# Patient Record
Sex: Male | Born: 1965 | Race: Black or African American | Hispanic: No | Marital: Single | Smoking: Former smoker
Health system: Southern US, Community
[De-identification: ages and names within clinical notes are randomized; demographics above are authoritative.]

## PROBLEM LIST (undated history)

## (undated) DIAGNOSIS — I639 Cerebral infarction, unspecified: Secondary | ICD-10-CM

## (undated) DIAGNOSIS — G8929 Other chronic pain: Secondary | ICD-10-CM

## (undated) DIAGNOSIS — R443 Hallucinations, unspecified: Secondary | ICD-10-CM

## (undated) DIAGNOSIS — F419 Anxiety disorder, unspecified: Secondary | ICD-10-CM

## (undated) DIAGNOSIS — Z8739 Personal history of other diseases of the musculoskeletal system and connective tissue: Secondary | ICD-10-CM

## (undated) DIAGNOSIS — G4730 Sleep apnea, unspecified: Secondary | ICD-10-CM

## (undated) DIAGNOSIS — R52 Pain, unspecified: Secondary | ICD-10-CM

## (undated) DIAGNOSIS — R079 Chest pain, unspecified: Secondary | ICD-10-CM

## (undated) DIAGNOSIS — M79604 Pain in right leg: Secondary | ICD-10-CM

## (undated) DIAGNOSIS — I10 Essential (primary) hypertension: Secondary | ICD-10-CM

## (undated) DIAGNOSIS — G4700 Insomnia, unspecified: Secondary | ICD-10-CM

## (undated) DIAGNOSIS — I2510 Atherosclerotic heart disease of native coronary artery without angina pectoris: Secondary | ICD-10-CM

## (undated) DIAGNOSIS — IMO0001 Reserved for inherently not codable concepts without codable children: Secondary | ICD-10-CM

## (undated) DIAGNOSIS — R51 Headache: Secondary | ICD-10-CM

## (undated) DIAGNOSIS — N289 Disorder of kidney and ureter, unspecified: Secondary | ICD-10-CM

## (undated) DIAGNOSIS — R569 Unspecified convulsions: Secondary | ICD-10-CM

## (undated) DIAGNOSIS — F209 Schizophrenia, unspecified: Secondary | ICD-10-CM

## (undated) DIAGNOSIS — Z531 Procedure and treatment not carried out because of patient's decision for reasons of belief and group pressure: Secondary | ICD-10-CM

## (undated) DIAGNOSIS — F329 Major depressive disorder, single episode, unspecified: Secondary | ICD-10-CM

## (undated) DIAGNOSIS — F1910 Other psychoactive substance abuse, uncomplicated: Secondary | ICD-10-CM

## (undated) DIAGNOSIS — R0789 Other chest pain: Secondary | ICD-10-CM

## (undated) DIAGNOSIS — M5412 Radiculopathy, cervical region: Secondary | ICD-10-CM

## (undated) DIAGNOSIS — M779 Enthesopathy, unspecified: Secondary | ICD-10-CM

## (undated) DIAGNOSIS — F319 Bipolar disorder, unspecified: Secondary | ICD-10-CM

## (undated) DIAGNOSIS — M542 Cervicalgia: Secondary | ICD-10-CM

## (undated) DIAGNOSIS — F32A Depression, unspecified: Secondary | ICD-10-CM

## (undated) DIAGNOSIS — G629 Polyneuropathy, unspecified: Secondary | ICD-10-CM

## (undated) DIAGNOSIS — I219 Acute myocardial infarction, unspecified: Secondary | ICD-10-CM

## (undated) DIAGNOSIS — M549 Dorsalgia, unspecified: Secondary | ICD-10-CM

## (undated) HISTORY — DX: Anxiety disorder, unspecified: F41.9

## (undated) HISTORY — PX: TONSILLECTOMY: SUR1361

## (undated) HISTORY — DX: Sleep apnea, unspecified: G47.30

## (undated) HISTORY — DX: Enthesopathy, unspecified: M77.9

## (undated) HISTORY — DX: Essential (primary) hypertension: I10

## (undated) HISTORY — PX: MANDIBLE FRACTURE SURGERY: SHX706

## (undated) HISTORY — DX: Unspecified convulsions: R56.9

## (undated) HISTORY — PX: KNEE SURGERY: SHX244

## (undated) HISTORY — DX: Disorder of kidney and ureter, unspecified: N28.9

## (undated) HISTORY — DX: Hallucinations, unspecified: R44.3

## (undated) HISTORY — DX: Depression, unspecified: F32.A

## (undated) HISTORY — DX: Major depressive disorder, single episode, unspecified: F32.9

---

## 2003-02-02 ENCOUNTER — Emergency Department (HOSPITAL_COMMUNITY): Admission: EM | Admit: 2003-02-02 | Discharge: 2003-02-02 | Payer: Self-pay | Admitting: Internal Medicine

## 2004-11-22 ENCOUNTER — Emergency Department (HOSPITAL_COMMUNITY): Admission: EM | Admit: 2004-11-22 | Discharge: 2004-11-22 | Payer: Self-pay | Admitting: Emergency Medicine

## 2006-11-18 ENCOUNTER — Emergency Department (HOSPITAL_COMMUNITY): Admission: EM | Admit: 2006-11-18 | Discharge: 2006-11-18 | Payer: Self-pay | Admitting: Emergency Medicine

## 2007-03-07 ENCOUNTER — Emergency Department (HOSPITAL_COMMUNITY): Admission: EM | Admit: 2007-03-07 | Discharge: 2007-03-07 | Payer: Self-pay | Admitting: Emergency Medicine

## 2007-04-29 ENCOUNTER — Emergency Department (HOSPITAL_COMMUNITY): Admission: EM | Admit: 2007-04-29 | Discharge: 2007-04-29 | Payer: Self-pay | Admitting: Emergency Medicine

## 2008-01-08 ENCOUNTER — Emergency Department (HOSPITAL_COMMUNITY): Admission: EM | Admit: 2008-01-08 | Discharge: 2008-01-08 | Payer: Self-pay | Admitting: Emergency Medicine

## 2008-01-21 ENCOUNTER — Ambulatory Visit (HOSPITAL_COMMUNITY): Admission: RE | Admit: 2008-01-21 | Discharge: 2008-01-21 | Payer: Self-pay | Admitting: Neurology

## 2008-01-28 ENCOUNTER — Emergency Department (HOSPITAL_COMMUNITY): Admission: EM | Admit: 2008-01-28 | Discharge: 2008-01-28 | Payer: Self-pay | Admitting: Emergency Medicine

## 2008-03-02 ENCOUNTER — Emergency Department (HOSPITAL_COMMUNITY): Admission: EM | Admit: 2008-03-02 | Discharge: 2008-03-02 | Payer: Self-pay | Admitting: Emergency Medicine

## 2008-03-04 ENCOUNTER — Emergency Department (HOSPITAL_COMMUNITY): Admission: EM | Admit: 2008-03-04 | Discharge: 2008-03-04 | Payer: Self-pay | Admitting: Emergency Medicine

## 2008-04-01 ENCOUNTER — Ambulatory Visit: Payer: Self-pay | Admitting: Psychiatry

## 2008-04-01 ENCOUNTER — Inpatient Hospital Stay (HOSPITAL_COMMUNITY): Admission: AD | Admit: 2008-04-01 | Discharge: 2008-04-06 | Payer: Self-pay | Admitting: Psychiatry

## 2008-05-23 ENCOUNTER — Emergency Department (HOSPITAL_COMMUNITY): Admission: EM | Admit: 2008-05-23 | Discharge: 2008-05-23 | Payer: Self-pay | Admitting: Emergency Medicine

## 2008-07-21 ENCOUNTER — Emergency Department (HOSPITAL_COMMUNITY): Admission: EM | Admit: 2008-07-21 | Discharge: 2008-07-21 | Payer: Self-pay | Admitting: Emergency Medicine

## 2008-10-02 ENCOUNTER — Emergency Department (HOSPITAL_COMMUNITY): Admission: EM | Admit: 2008-10-02 | Discharge: 2008-10-02 | Payer: Self-pay | Admitting: Emergency Medicine

## 2008-12-08 ENCOUNTER — Emergency Department (HOSPITAL_COMMUNITY): Admission: EM | Admit: 2008-12-08 | Discharge: 2008-12-08 | Payer: Self-pay | Admitting: Emergency Medicine

## 2008-12-11 ENCOUNTER — Emergency Department (HOSPITAL_COMMUNITY): Admission: EM | Admit: 2008-12-11 | Discharge: 2008-12-11 | Payer: Self-pay | Admitting: Emergency Medicine

## 2009-02-13 ENCOUNTER — Ambulatory Visit: Admission: RE | Admit: 2009-02-13 | Discharge: 2009-02-13 | Payer: Self-pay | Admitting: Neurology

## 2009-04-29 ENCOUNTER — Emergency Department (HOSPITAL_COMMUNITY): Admission: EM | Admit: 2009-04-29 | Discharge: 2009-04-29 | Payer: Self-pay | Admitting: Emergency Medicine

## 2009-07-01 ENCOUNTER — Encounter (HOSPITAL_COMMUNITY): Admission: RE | Admit: 2009-07-01 | Discharge: 2009-07-31 | Payer: Self-pay | Admitting: Family Medicine

## 2009-07-31 ENCOUNTER — Other Ambulatory Visit: Payer: Self-pay

## 2009-07-31 ENCOUNTER — Other Ambulatory Visit: Payer: Self-pay | Admitting: Emergency Medicine

## 2009-07-31 ENCOUNTER — Ambulatory Visit: Payer: Self-pay | Admitting: Psychiatry

## 2009-08-01 ENCOUNTER — Inpatient Hospital Stay (HOSPITAL_COMMUNITY): Admission: RE | Admit: 2009-08-01 | Discharge: 2009-08-05 | Payer: Self-pay | Admitting: Psychiatry

## 2010-02-17 ENCOUNTER — Emergency Department (HOSPITAL_COMMUNITY): Admission: EM | Admit: 2010-02-17 | Discharge: 2010-02-17 | Payer: Self-pay | Admitting: Emergency Medicine

## 2010-02-19 ENCOUNTER — Emergency Department (HOSPITAL_COMMUNITY): Admission: EM | Admit: 2010-02-19 | Discharge: 2010-02-19 | Payer: Self-pay | Admitting: Emergency Medicine

## 2010-02-24 ENCOUNTER — Inpatient Hospital Stay (HOSPITAL_COMMUNITY): Admission: EM | Admit: 2010-02-24 | Discharge: 2010-02-26 | Payer: Self-pay | Admitting: Emergency Medicine

## 2010-03-04 ENCOUNTER — Emergency Department (HOSPITAL_COMMUNITY): Admission: EM | Admit: 2010-03-04 | Discharge: 2010-03-04 | Payer: Self-pay | Admitting: Emergency Medicine

## 2010-03-25 ENCOUNTER — Emergency Department (HOSPITAL_COMMUNITY): Admission: EM | Admit: 2010-03-25 | Discharge: 2010-03-25 | Payer: Self-pay | Admitting: Emergency Medicine

## 2010-03-31 ENCOUNTER — Emergency Department (HOSPITAL_COMMUNITY): Admission: EM | Admit: 2010-03-31 | Discharge: 2010-03-31 | Payer: Self-pay | Admitting: Emergency Medicine

## 2010-04-21 ENCOUNTER — Emergency Department (HOSPITAL_COMMUNITY): Admission: EM | Admit: 2010-04-21 | Discharge: 2010-04-21 | Payer: Self-pay | Admitting: Emergency Medicine

## 2010-04-29 ENCOUNTER — Emergency Department (HOSPITAL_COMMUNITY): Admission: EM | Admit: 2010-04-29 | Discharge: 2010-04-29 | Payer: Self-pay | Admitting: Emergency Medicine

## 2010-06-04 ENCOUNTER — Encounter: Admission: RE | Admit: 2010-06-04 | Discharge: 2010-06-04 | Payer: Self-pay | Admitting: Diagnostic Neuroimaging

## 2010-06-06 ENCOUNTER — Other Ambulatory Visit: Payer: Self-pay | Admitting: Emergency Medicine

## 2010-06-06 ENCOUNTER — Other Ambulatory Visit: Payer: Self-pay | Admitting: Diagnostic Radiology

## 2010-06-07 ENCOUNTER — Other Ambulatory Visit: Payer: Self-pay | Admitting: Diagnostic Radiology

## 2010-06-07 ENCOUNTER — Inpatient Hospital Stay (HOSPITAL_COMMUNITY): Admission: AD | Admit: 2010-06-07 | Discharge: 2010-06-11 | Payer: Self-pay | Admitting: Psychiatry

## 2010-06-16 ENCOUNTER — Emergency Department (HOSPITAL_COMMUNITY): Admission: EM | Admit: 2010-06-16 | Discharge: 2010-06-16 | Payer: Self-pay | Admitting: Emergency Medicine

## 2010-08-21 ENCOUNTER — Encounter: Payer: Self-pay | Admitting: Diagnostic Neuroimaging

## 2010-08-30 ENCOUNTER — Emergency Department (HOSPITAL_COMMUNITY)
Admission: EM | Admit: 2010-08-30 | Discharge: 2010-08-30 | Payer: Self-pay | Source: Home / Self Care | Admitting: Emergency Medicine

## 2010-08-30 LAB — CBC
HCT: 39.9 % (ref 39.0–52.0)
Hemoglobin: 14.3 g/dL (ref 13.0–17.0)
MCV: 86.4 fL (ref 78.0–100.0)
RBC: 4.62 MIL/uL (ref 4.22–5.81)
RDW: 13.2 % (ref 11.5–15.5)
WBC: 6 10*3/uL (ref 4.0–10.5)

## 2010-08-30 LAB — BASIC METABOLIC PANEL
BUN: 10 mg/dL (ref 6–23)
CO2: 27 mEq/L (ref 19–32)
Chloride: 99 mEq/L (ref 96–112)
GFR calc non Af Amer: >60 mL/min (ref >60)
Glucose, Bld: 419 mg/dL — ABNORMAL HIGH (ref 70–99)
Potassium: 4.7 mEq/L (ref 3.5–5.1)
Sodium: 136 mEq/L (ref 135–145)

## 2010-08-30 LAB — URINALYSIS, ROUTINE W REFLEX MICROSCOPIC
Hgb urine dipstick: NEGATIVE
Specific Gravity, Urine: 1.01 (ref 1.005–1.030)
Urobilinogen, UA: 0.2 mg/dL (ref 0.0–1.0)
pH: 6 (ref 5.0–8.0)

## 2010-08-30 LAB — DIFFERENTIAL
Basophils Absolute: 0 10*3/uL (ref 0.0–0.1)
Lymphocytes Relative: 48 % — ABNORMAL HIGH (ref 12–46)
Lymphs Abs: 2.9 10*3/uL (ref 0.7–4.0)
Neutro Abs: 2.4 10*3/uL (ref 1.7–7.7)

## 2010-08-30 LAB — GLUCOSE, CAPILLARY: Glucose-Capillary: 393 mg/dL — ABNORMAL HIGH (ref 70–99)

## 2010-08-30 LAB — URINE MICROSCOPIC-ADD ON: Urine-Other: NONE SEEN

## 2010-09-16 ENCOUNTER — Emergency Department (HOSPITAL_COMMUNITY): Payer: Medicaid Other

## 2010-09-16 ENCOUNTER — Emergency Department (HOSPITAL_COMMUNITY)
Admission: EM | Admit: 2010-09-16 | Discharge: 2010-09-16 | Disposition: A | Discharge status: Home / Self Care | Payer: Medicaid Other | Attending: Emergency Medicine | Admitting: Emergency Medicine

## 2010-09-16 DIAGNOSIS — M79609 Pain in unspecified limb: Secondary | ICD-10-CM | POA: Insufficient documentation

## 2010-09-16 DIAGNOSIS — I10 Essential (primary) hypertension: Secondary | ICD-10-CM | POA: Insufficient documentation

## 2010-09-16 DIAGNOSIS — E119 Type 2 diabetes mellitus without complications: Secondary | ICD-10-CM | POA: Insufficient documentation

## 2010-09-16 DIAGNOSIS — Z79899 Other long term (current) drug therapy: Secondary | ICD-10-CM | POA: Insufficient documentation

## 2010-09-16 DIAGNOSIS — Z8639 Personal history of other endocrine, nutritional and metabolic disease: Secondary | ICD-10-CM | POA: Insufficient documentation

## 2010-09-16 DIAGNOSIS — Z862 Personal history of diseases of the blood and blood-forming organs and certain disorders involving the immune mechanism: Secondary | ICD-10-CM | POA: Insufficient documentation

## 2010-09-16 DIAGNOSIS — G40909 Epilepsy, unspecified, not intractable, without status epilepticus: Secondary | ICD-10-CM | POA: Insufficient documentation

## 2010-09-16 DIAGNOSIS — M25579 Pain in unspecified ankle and joints of unspecified foot: Secondary | ICD-10-CM | POA: Insufficient documentation

## 2010-09-19 ENCOUNTER — Emergency Department (HOSPITAL_COMMUNITY)
Admission: EM | Admit: 2010-09-19 | Discharge: 2010-09-19 | Disposition: A | Discharge status: Home / Self Care | Payer: Medicaid Other | Attending: Emergency Medicine | Admitting: Emergency Medicine

## 2010-09-19 DIAGNOSIS — M79609 Pain in unspecified limb: Secondary | ICD-10-CM | POA: Insufficient documentation

## 2010-09-19 DIAGNOSIS — M25579 Pain in unspecified ankle and joints of unspecified foot: Secondary | ICD-10-CM | POA: Insufficient documentation

## 2010-09-19 DIAGNOSIS — I10 Essential (primary) hypertension: Secondary | ICD-10-CM | POA: Insufficient documentation

## 2010-09-19 LAB — D-DIMER, QUANTITATIVE: D-Dimer, Quant: 0.33 ug/mL-FEU (ref 0.00–0.48)

## 2010-10-12 LAB — GLUCOSE, CAPILLARY
Glucose-Capillary: 108 mg/dL — ABNORMAL HIGH (ref 70–99)
Glucose-Capillary: 117 mg/dL — ABNORMAL HIGH (ref 70–99)
Glucose-Capillary: 131 mg/dL — ABNORMAL HIGH (ref 70–99)
Glucose-Capillary: 150 mg/dL — ABNORMAL HIGH (ref 70–99)
Glucose-Capillary: 172 mg/dL — ABNORMAL HIGH (ref 70–99)
Glucose-Capillary: 317 mg/dL — ABNORMAL HIGH (ref 70–99)
Glucose-Capillary: 72 mg/dL (ref 70–99)
Glucose-Capillary: 94 mg/dL (ref 70–99)

## 2010-10-12 LAB — DIFFERENTIAL
Lymphocytes Relative: 49 % — ABNORMAL HIGH (ref 12–46)
Lymphs Abs: 3.1 10*3/uL (ref 0.7–4.0)
Monocytes Relative: 11 % (ref 3–12)
Neutro Abs: 2.4 10*3/uL (ref 1.7–7.7)
Neutrophils Relative %: 38 % — ABNORMAL LOW (ref 43–77)

## 2010-10-12 LAB — CBC
Hemoglobin: 14.2 g/dL (ref 13.0–17.0)
RBC: 4.6 MIL/uL (ref 4.22–5.81)

## 2010-10-12 LAB — URINALYSIS, ROUTINE W REFLEX MICROSCOPIC
Glucose, UA: NEGATIVE mg/dL
Hgb urine dipstick: NEGATIVE
Ketones, ur: NEGATIVE mg/dL
pH: 5.5 (ref 5.0–8.0)

## 2010-10-12 LAB — BASIC METABOLIC PANEL
Calcium: 9.1 mg/dL (ref 8.4–10.5)
GFR calc Af Amer: >60 mL/min (ref >60)
GFR calc non Af Amer: >60 mL/min (ref >60)
Sodium: 141 mEq/L (ref 135–145)

## 2010-10-12 LAB — RAPID URINE DRUG SCREEN, HOSP PERFORMED
Barbiturates: POSITIVE — AB
Cocaine: POSITIVE — AB

## 2010-10-12 LAB — VALPROIC ACID LEVEL
Valproic Acid Lvl: 16 ug/mL — ABNORMAL LOW (ref 50.0–100.0)
Valproic Acid Lvl: 32 ug/mL — ABNORMAL LOW (ref 50.0–100.0)

## 2010-10-12 LAB — ACETAMINOPHEN LEVEL: Acetaminophen (Tylenol), Serum: <10 ug/mL — ABNORMAL LOW (ref 10–30)

## 2010-10-14 LAB — BASIC METABOLIC PANEL
Chloride: 106 mEq/L (ref 96–112)
GFR calc non Af Amer: >60 mL/min (ref >60)
Glucose, Bld: 70 mg/dL (ref 70–99)
Potassium: 4 mEq/L (ref 3.5–5.1)
Sodium: 141 mEq/L (ref 135–145)

## 2010-10-14 LAB — DIFFERENTIAL
Lymphocytes Relative: 43 % (ref 12–46)
Monocytes Absolute: 0.8 10*3/uL (ref 0.1–1.0)
Monocytes Relative: 14 % — ABNORMAL HIGH (ref 3–12)
Neutro Abs: 2.3 10*3/uL (ref 1.7–7.7)

## 2010-10-14 LAB — CBC
HCT: 43.8 % (ref 39.0–52.0)
Hemoglobin: 14.9 g/dL (ref 13.0–17.0)
MCHC: 34 g/dL (ref 30.0–36.0)
MCV: 91.9 fL (ref 78.0–100.0)
WBC: 5.7 10*3/uL (ref 4.0–10.5)

## 2010-10-15 ENCOUNTER — Emergency Department (HOSPITAL_COMMUNITY)
Admission: EM | Admit: 2010-10-15 | Discharge: 2010-10-15 | Disposition: A | Discharge status: Home / Self Care | Payer: Medicaid Other | Attending: Emergency Medicine | Admitting: Emergency Medicine

## 2010-10-15 DIAGNOSIS — M79609 Pain in unspecified limb: Secondary | ICD-10-CM | POA: Insufficient documentation

## 2010-10-15 DIAGNOSIS — G40919 Epilepsy, unspecified, intractable, without status epilepticus: Secondary | ICD-10-CM | POA: Insufficient documentation

## 2010-10-15 DIAGNOSIS — E119 Type 2 diabetes mellitus without complications: Secondary | ICD-10-CM | POA: Insufficient documentation

## 2010-10-15 DIAGNOSIS — M549 Dorsalgia, unspecified: Secondary | ICD-10-CM | POA: Insufficient documentation

## 2010-10-15 DIAGNOSIS — Z79899 Other long term (current) drug therapy: Secondary | ICD-10-CM | POA: Insufficient documentation

## 2010-10-15 DIAGNOSIS — I10 Essential (primary) hypertension: Secondary | ICD-10-CM | POA: Insufficient documentation

## 2010-10-15 LAB — BASIC METABOLIC PANEL
BUN: 10 mg/dL (ref 6–23)
CO2: 33 mEq/L — ABNORMAL HIGH (ref 19–32)
Chloride: 100 mEq/L (ref 96–112)
Chloride: 108 mEq/L (ref 96–112)
GFR calc Af Amer: >60 mL/min (ref >60)
GFR calc non Af Amer: >60 mL/min (ref >60)
Glucose, Bld: 96 mg/dL (ref 70–99)
Potassium: 3.8 mEq/L (ref 3.5–5.1)
Potassium: 3.9 mEq/L (ref 3.5–5.1)
Sodium: 139 mEq/L (ref 135–145)
Sodium: 143 mEq/L (ref 135–145)

## 2010-10-15 LAB — DIFFERENTIAL
Basophils Absolute: 0 10*3/uL (ref 0.0–0.1)
Basophils Relative: 0 % (ref 0–1)
Lymphocytes Relative: 47 % — ABNORMAL HIGH (ref 12–46)
Monocytes Relative: 9 % (ref 3–12)
Neutro Abs: 2.7 10*3/uL (ref 1.7–7.7)

## 2010-10-15 LAB — CBC
HCT: 45 % (ref 39.0–52.0)
Hemoglobin: 15.1 g/dL (ref 13.0–17.0)
MCH: 31 pg (ref 26.0–34.0)
MCV: 92.6 fL (ref 78.0–100.0)
RBC: 4.87 MIL/uL (ref 4.22–5.81)

## 2010-10-15 LAB — GLUCOSE, CAPILLARY: Glucose-Capillary: 201 mg/dL — ABNORMAL HIGH (ref 70–99)

## 2010-10-16 LAB — GLUCOSE, CAPILLARY
Glucose-Capillary: 117 mg/dL — ABNORMAL HIGH (ref 70–99)
Glucose-Capillary: 219 mg/dL — ABNORMAL HIGH (ref 70–99)
Glucose-Capillary: 231 mg/dL — ABNORMAL HIGH (ref 70–99)
Glucose-Capillary: 238 mg/dL — ABNORMAL HIGH (ref 70–99)
Glucose-Capillary: 362 mg/dL — ABNORMAL HIGH (ref 70–99)
Glucose-Capillary: 392 mg/dL — ABNORMAL HIGH (ref 70–99)

## 2010-10-16 LAB — POCT CARDIAC MARKERS
CKMB, poc: 1.6 ng/mL (ref 1.0–8.0)
CKMB, poc: 2.7 ng/mL (ref 1.0–8.0)
Myoglobin, poc: 96.4 ng/mL (ref 12–200)
Troponin i, poc: <0.05 ng/mL (ref 0.00–0.09)

## 2010-10-16 LAB — URINALYSIS, ROUTINE W REFLEX MICROSCOPIC
Glucose, UA: 100 mg/dL — AB
Hgb urine dipstick: NEGATIVE
Ketones, ur: 15 mg/dL — AB
Nitrite: NEGATIVE
Protein, ur: NEGATIVE mg/dL
Urobilinogen, UA: 0.2 mg/dL (ref 0.0–1.0)
pH: 6 (ref 5.0–8.0)

## 2010-10-16 LAB — COMPREHENSIVE METABOLIC PANEL
ALT: 36 U/L (ref 0–53)
AST: 27 U/L (ref 0–37)
Alkaline Phosphatase: 32 U/L — ABNORMAL LOW (ref 39–117)
Alkaline Phosphatase: 55 U/L (ref 39–117)
BUN: 13 mg/dL (ref 6–23)
BUN: 18 mg/dL (ref 6–23)
CO2: 25 mEq/L (ref 19–32)
CO2: 27 mEq/L (ref 19–32)
CO2: 29 mEq/L (ref 19–32)
Calcium: 9.3 mg/dL (ref 8.4–10.5)
Calcium: 9.9 mg/dL (ref 8.4–10.5)
Chloride: 105 mEq/L (ref 96–112)
Creatinine, Ser: 1.3 mg/dL (ref 0.4–1.5)
GFR calc Af Amer: >60 mL/min (ref >60)
GFR calc non Af Amer: 60 mL/min — ABNORMAL LOW (ref >60)
GFR calc non Af Amer: >60 mL/min (ref >60)
Glucose, Bld: 193 mg/dL — ABNORMAL HIGH (ref 70–99)
Glucose, Bld: 213 mg/dL — ABNORMAL HIGH (ref 70–99)
Glucose, Bld: 314 mg/dL — ABNORMAL HIGH (ref 70–99)
Potassium: 3.5 mEq/L (ref 3.5–5.1)
Potassium: 3.6 mEq/L (ref 3.5–5.1)
Sodium: 133 mEq/L — ABNORMAL LOW (ref 135–145)
Total Bilirubin: 0.7 mg/dL (ref 0.3–1.2)
Total Protein: 8.3 g/dL (ref 6.0–8.3)

## 2010-10-16 LAB — LIPID PANEL
HDL: 27 mg/dL — ABNORMAL LOW (ref >39)
Triglycerides: 223 mg/dL — ABNORMAL HIGH (ref <150)

## 2010-10-16 LAB — CBC
HCT: 37.3 % — ABNORMAL LOW (ref 39.0–52.0)
HCT: 43.1 % (ref 39.0–52.0)
Hemoglobin: 13.1 g/dL (ref 13.0–17.0)
Hemoglobin: 14.9 g/dL (ref 13.0–17.0)
MCH: 31.1 pg (ref 26.0–34.0)
MCH: 31.1 pg (ref 26.0–34.0)
MCHC: 34.6 g/dL (ref 30.0–36.0)
MCHC: 35 g/dL (ref 30.0–36.0)
MCV: 89.4 fL (ref 78.0–100.0)
Platelets: 122 10*3/uL — ABNORMAL LOW (ref 150–400)
RBC: 4.18 MIL/uL — ABNORMAL LOW (ref 4.22–5.81)
RDW: 13.1 % (ref 11.5–15.5)
RDW: 13.8 % (ref 11.5–15.5)
WBC: 6.1 10*3/uL (ref 4.0–10.5)

## 2010-10-16 LAB — DIFFERENTIAL
Basophils Absolute: 0 10*3/uL (ref 0.0–0.1)
Basophils Absolute: 0 10*3/uL (ref 0.0–0.1)
Basophils Relative: 0 % (ref 0–1)
Eosinophils Absolute: 0.1 10*3/uL (ref 0.0–0.7)
Eosinophils Absolute: 0.2 10*3/uL (ref 0.0–0.7)
Eosinophils Absolute: 0.2 10*3/uL (ref 0.0–0.7)
Lymphocytes Relative: 50 % — ABNORMAL HIGH (ref 12–46)
Lymphocytes Relative: 51 % — ABNORMAL HIGH (ref 12–46)
Lymphocytes Relative: 53 % — ABNORMAL HIGH (ref 12–46)
Lymphs Abs: 2.9 10*3/uL (ref 0.7–4.0)
Monocytes Absolute: 0.5 10*3/uL (ref 0.1–1.0)
Monocytes Absolute: 0.6 10*3/uL (ref 0.1–1.0)
Neutrophils Relative %: 34 % — ABNORMAL LOW (ref 43–77)
Neutrophils Relative %: 38 % — ABNORMAL LOW (ref 43–77)
Neutrophils Relative %: 38 % — ABNORMAL LOW (ref 43–77)

## 2010-10-16 LAB — RAPID URINE DRUG SCREEN, HOSP PERFORMED
Amphetamines: NOT DETECTED
Barbiturates: NOT DETECTED
Opiates: NOT DETECTED
Tetrahydrocannabinol: NOT DETECTED

## 2010-10-16 LAB — BASIC METABOLIC PANEL
BUN: 20 mg/dL (ref 6–23)
CO2: 29 mEq/L (ref 19–32)
Calcium: 8.8 mg/dL (ref 8.4–10.5)
Creatinine, Ser: 1.44 mg/dL (ref 0.4–1.5)
GFR calc non Af Amer: 53 mL/min — ABNORMAL LOW (ref >60)
Glucose, Bld: 400 mg/dL — ABNORMAL HIGH (ref 70–99)

## 2010-10-16 LAB — HEMOGLOBIN A1C: Hgb A1c MFr Bld: 10.8 % — ABNORMAL HIGH (ref <5.7)

## 2010-10-16 LAB — AMMONIA
Ammonia: 34 umol/L (ref 11–35)
Ammonia: 46 umol/L — ABNORMAL HIGH (ref 11–35)

## 2010-10-16 LAB — TSH: TSH: 0.337 u[IU]/mL — ABNORMAL LOW (ref 0.350–4.500)

## 2010-10-16 LAB — URINE MICROSCOPIC-ADD ON

## 2010-10-16 LAB — CARDIAC PANEL(CRET KIN+CKTOT+MB+TROPI)
CK, MB: 4.4 ng/mL — ABNORMAL HIGH (ref 0.3–4.0)
Relative Index: 1.7 (ref 0.0–2.5)
Troponin I: 0.01 ng/mL (ref 0.00–0.06)
Troponin I: <0.01 ng/mL (ref 0.00–0.06)

## 2010-10-16 LAB — D-DIMER, QUANTITATIVE: D-Dimer, Quant: 0.31 ug/mL-FEU (ref 0.00–0.48)

## 2010-10-16 LAB — VALPROIC ACID LEVEL
Valproic Acid Lvl: 113 ug/mL — ABNORMAL HIGH (ref 50.0–100.0)
Valproic Acid Lvl: 57 ug/mL (ref 50.0–100.0)

## 2010-10-16 LAB — VITAMIN D 1,25 DIHYDROXY
Vitamin D 1, 25 (OH)2 Total: 21 pg/mL (ref 18–72)
Vitamin D3 1, 25 (OH)2: <8 pg/mL

## 2010-10-17 LAB — LIPID PANEL
Cholesterol: 186 mg/dL (ref 0–200)
LDL Cholesterol: 112 mg/dL — ABNORMAL HIGH (ref 0–99)
Total CHOL/HDL Ratio: 3.5 RATIO

## 2010-10-17 LAB — HEPATIC FUNCTION PANEL
ALT: 16 U/L (ref 0–53)
AST: 13 U/L (ref 0–37)
Alkaline Phosphatase: 28 U/L — ABNORMAL LOW (ref 39–117)
Indirect Bilirubin: 0.8 mg/dL (ref 0.3–0.9)
Total Protein: 6.6 g/dL (ref 6.0–8.3)

## 2010-10-17 LAB — TSH: TSH: 0.246 u[IU]/mL — ABNORMAL LOW (ref 0.350–4.500)

## 2010-10-17 LAB — T4, FREE: Free T4: 0.9 ng/dL (ref 0.80–1.80)

## 2010-10-20 ENCOUNTER — Emergency Department (HOSPITAL_COMMUNITY)
Admission: EM | Admit: 2010-10-20 | Discharge: 2010-10-20 | Disposition: A | Discharge status: Home / Self Care | Payer: Medicaid Other | Attending: Emergency Medicine | Admitting: Emergency Medicine

## 2010-10-20 ENCOUNTER — Emergency Department (HOSPITAL_COMMUNITY): Payer: Medicaid Other

## 2010-10-20 DIAGNOSIS — G40909 Epilepsy, unspecified, not intractable, without status epilepticus: Secondary | ICD-10-CM | POA: Insufficient documentation

## 2010-10-20 DIAGNOSIS — M5430 Sciatica, unspecified side: Secondary | ICD-10-CM | POA: Insufficient documentation

## 2010-10-20 DIAGNOSIS — F329 Major depressive disorder, single episode, unspecified: Secondary | ICD-10-CM | POA: Insufficient documentation

## 2010-10-20 DIAGNOSIS — F3289 Other specified depressive episodes: Secondary | ICD-10-CM | POA: Insufficient documentation

## 2010-10-20 DIAGNOSIS — I10 Essential (primary) hypertension: Secondary | ICD-10-CM | POA: Insufficient documentation

## 2010-10-20 DIAGNOSIS — E119 Type 2 diabetes mellitus without complications: Secondary | ICD-10-CM | POA: Insufficient documentation

## 2010-10-20 LAB — CBC
HCT: 42.8 % (ref 39.0–52.0)
Hemoglobin: 15.2 g/dL (ref 13.0–17.0)
MCV: 86.5 fL (ref 78.0–100.0)
RBC: 4.95 MIL/uL (ref 4.22–5.81)
RDW: 12.6 % (ref 11.5–15.5)
WBC: 7 10*3/uL (ref 4.0–10.5)

## 2010-10-20 LAB — DIFFERENTIAL
Basophils Absolute: 0 10*3/uL (ref 0.0–0.1)
Eosinophils Relative: 2 % (ref 0–5)
Lymphocytes Relative: 56 % — ABNORMAL HIGH (ref 12–46)
Lymphs Abs: 3.9 10*3/uL (ref 0.7–4.0)
Neutro Abs: 2.3 10*3/uL (ref 1.7–7.7)
Neutrophils Relative %: 32 % — ABNORMAL LOW (ref 43–77)

## 2010-10-20 LAB — URINALYSIS, ROUTINE W REFLEX MICROSCOPIC
Bilirubin Urine: NEGATIVE
Glucose, UA: >1000 mg/dL — AB
Leukocytes, UA: NEGATIVE
Nitrite: NEGATIVE
Specific Gravity, Urine: 1.02 (ref 1.005–1.030)
pH: 5.5 (ref 5.0–8.0)

## 2010-10-20 LAB — BASIC METABOLIC PANEL
BUN: 12 mg/dL (ref 6–23)
Chloride: 96 mEq/L (ref 96–112)
GFR calc non Af Amer: >60 mL/min (ref >60)
Glucose, Bld: 391 mg/dL — ABNORMAL HIGH (ref 70–99)
Potassium: 3.8 mEq/L (ref 3.5–5.1)
Sodium: 135 mEq/L (ref 135–145)

## 2010-10-20 LAB — URINE MICROSCOPIC-ADD ON

## 2010-10-20 LAB — GLUCOSE, CAPILLARY: Glucose-Capillary: 111 mg/dL — ABNORMAL HIGH (ref 70–99)

## 2010-10-21 ENCOUNTER — Emergency Department (HOSPITAL_COMMUNITY)
Admission: EM | Admit: 2010-10-21 | Discharge: 2010-10-21 | Disposition: A | Discharge status: Home / Self Care | Payer: Medicaid Other | Attending: Emergency Medicine | Admitting: Emergency Medicine

## 2010-10-21 ENCOUNTER — Emergency Department (HOSPITAL_COMMUNITY): Payer: Medicaid Other

## 2010-10-21 DIAGNOSIS — W268XXA Contact with other sharp object(s), not elsewhere classified, initial encounter: Secondary | ICD-10-CM | POA: Insufficient documentation

## 2010-10-21 DIAGNOSIS — E119 Type 2 diabetes mellitus without complications: Secondary | ICD-10-CM | POA: Insufficient documentation

## 2010-10-21 DIAGNOSIS — Y92009 Unspecified place in unspecified non-institutional (private) residence as the place of occurrence of the external cause: Secondary | ICD-10-CM | POA: Insufficient documentation

## 2010-10-21 DIAGNOSIS — I10 Essential (primary) hypertension: Secondary | ICD-10-CM | POA: Insufficient documentation

## 2010-10-21 DIAGNOSIS — S61209A Unspecified open wound of unspecified finger without damage to nail, initial encounter: Secondary | ICD-10-CM | POA: Insufficient documentation

## 2010-10-21 DIAGNOSIS — F341 Dysthymic disorder: Secondary | ICD-10-CM | POA: Insufficient documentation

## 2010-10-21 DIAGNOSIS — Z79899 Other long term (current) drug therapy: Secondary | ICD-10-CM | POA: Insufficient documentation

## 2010-10-25 ENCOUNTER — Inpatient Hospital Stay (HOSPITAL_COMMUNITY)
Admission: EM | Admit: 2010-10-25 | Discharge: 2010-10-26 | DRG: 638 | Disposition: A | Discharge status: Home / Self Care | Payer: Medicaid Other | Attending: Internal Medicine | Admitting: Internal Medicine

## 2010-10-25 DIAGNOSIS — Z91199 Patient's noncompliance with other medical treatment and regimen due to unspecified reason: Secondary | ICD-10-CM

## 2010-10-25 DIAGNOSIS — G4733 Obstructive sleep apnea (adult) (pediatric): Secondary | ICD-10-CM | POA: Diagnosis present

## 2010-10-25 DIAGNOSIS — F17200 Nicotine dependence, unspecified, uncomplicated: Secondary | ICD-10-CM | POA: Diagnosis present

## 2010-10-25 DIAGNOSIS — F319 Bipolar disorder, unspecified: Secondary | ICD-10-CM | POA: Diagnosis present

## 2010-10-25 DIAGNOSIS — E871 Hypo-osmolality and hyponatremia: Secondary | ICD-10-CM | POA: Diagnosis present

## 2010-10-25 DIAGNOSIS — I10 Essential (primary) hypertension: Secondary | ICD-10-CM | POA: Diagnosis present

## 2010-10-25 DIAGNOSIS — G40909 Epilepsy, unspecified, not intractable, without status epilepticus: Secondary | ICD-10-CM | POA: Diagnosis present

## 2010-10-25 DIAGNOSIS — M79609 Pain in unspecified limb: Secondary | ICD-10-CM | POA: Diagnosis present

## 2010-10-25 DIAGNOSIS — Z9119 Patient's noncompliance with other medical treatment and regimen: Secondary | ICD-10-CM

## 2010-10-25 DIAGNOSIS — IMO0001 Reserved for inherently not codable concepts without codable children: Principal | ICD-10-CM | POA: Diagnosis present

## 2010-10-25 DIAGNOSIS — Z794 Long term (current) use of insulin: Secondary | ICD-10-CM

## 2010-10-25 LAB — PROTIME-INR: INR: 0.92 (ref 0.00–1.49)

## 2010-10-25 LAB — URINALYSIS, ROUTINE W REFLEX MICROSCOPIC
Bilirubin Urine: NEGATIVE
Glucose, UA: >1000 mg/dL — AB
Hgb urine dipstick: NEGATIVE
Ketones, ur: NEGATIVE mg/dL
pH: 5 (ref 5.0–8.0)

## 2010-10-25 LAB — CBC
HCT: 42.5 % (ref 39.0–52.0)
Hemoglobin: 14.9 g/dL (ref 13.0–17.0)
RBC: 4.91 MIL/uL (ref 4.22–5.81)
RDW: 12.6 % (ref 11.5–15.5)
WBC: 7.5 10*3/uL (ref 4.0–10.5)

## 2010-10-25 LAB — DIFFERENTIAL
Basophils Absolute: 0 10*3/uL (ref 0.0–0.1)
Eosinophils Relative: 2 % (ref 0–5)
Lymphocytes Relative: 59 % — ABNORMAL HIGH (ref 12–46)
Neutro Abs: 2.1 10*3/uL (ref 1.7–7.7)
Neutrophils Relative %: 29 % — ABNORMAL LOW (ref 43–77)

## 2010-10-25 LAB — BASIC METABOLIC PANEL
BUN: 11 mg/dL (ref 6–23)
BUN: 10 mg/dL (ref 6–23)
Chloride: 92 mEq/L — ABNORMAL LOW (ref 96–112)
Creatinine, Ser: 1.28 mg/dL (ref 0.4–1.5)
GFR calc non Af Amer: >60 mL/min (ref >60)
Glucose, Bld: 682 mg/dL (ref 70–99)
Glucose, Bld: 282 mg/dL — ABNORMAL HIGH (ref 70–99)
Potassium: 4.5 mEq/L (ref 3.5–5.1)
Potassium: 4.1 mEq/L (ref 3.5–5.1)

## 2010-10-25 LAB — VALPROIC ACID LEVEL: Valproic Acid Lvl: 77 ug/mL (ref 50.0–100.0)

## 2010-10-25 LAB — GLUCOSE, CAPILLARY
Glucose-Capillary: 425 mg/dL — ABNORMAL HIGH (ref 70–99)
Glucose-Capillary: 370 mg/dL — ABNORMAL HIGH (ref 70–99)

## 2010-10-25 LAB — APTT: aPTT: 29 seconds (ref 24–37)

## 2010-10-26 LAB — HEPATIC FUNCTION PANEL
Bilirubin, Direct: 0.1 mg/dL (ref 0.0–0.3)
Total Bilirubin: 0.6 mg/dL (ref 0.3–1.2)

## 2010-10-26 LAB — CBC
HCT: 40 % (ref 39.0–52.0)
MCH: 30 pg (ref 26.0–34.0)
MCHC: 34 g/dL (ref 30.0–36.0)
MCV: 88.3 fL (ref 78.0–100.0)
RDW: 12.8 % (ref 11.5–15.5)

## 2010-10-26 LAB — COMPREHENSIVE METABOLIC PANEL
Albumin: 3.4 g/dL — ABNORMAL LOW (ref 3.5–5.2)
BUN: 11 mg/dL (ref 6–23)
CO2: 28 mEq/L (ref 19–32)
Chloride: 103 mEq/L (ref 96–112)
Creatinine, Ser: 1.22 mg/dL (ref 0.4–1.5)
GFR calc non Af Amer: >60 mL/min (ref >60)
Total Bilirubin: 0.4 mg/dL (ref 0.3–1.2)

## 2010-10-26 LAB — DIFFERENTIAL
Eosinophils Relative: 3 % (ref 0–5)
Lymphocytes Relative: 62 % — ABNORMAL HIGH (ref 12–46)
Lymphs Abs: 4 10*3/uL (ref 0.7–4.0)
Monocytes Absolute: 0.6 10*3/uL (ref 0.1–1.0)

## 2010-10-26 LAB — HEMOGLOBIN A1C
Hgb A1c MFr Bld: 10.1 % — ABNORMAL HIGH (ref <5.7)
Mean Plasma Glucose: 243 mg/dL — ABNORMAL HIGH (ref <117)

## 2010-10-26 LAB — T4, FREE: Free T4: 1.12 ng/dL (ref 0.80–1.80)

## 2010-10-26 LAB — TSH: TSH: 0.783 u[IU]/mL (ref 0.350–4.500)

## 2010-10-28 NOTE — Discharge Summary (Signed)
NAME:  [place], [place] NO.:  [id number]  MEDICAL RECORD NO.:  [id number]           PATIENT TYPE:  I  LOCATION:  A303                          FACILITY:  [facility]  PHYSICIAN:  Elliot Cousin, M.D.    DATE OF BIRTH:  07-24-1966  DATE OF ADMISSION:  10/25/2010 DATE OF DISCHARGE:  03/27/2012LH                              DISCHARGE SUMMARY   DISCHARGE DIAGNOSES: 1. Nonketotic hyperosmolar hyperglycemia, secondary to uncontrolled     type 2 diabetes mellitus.  The patient's venous glucose was 682 on     admission and 282 at the time of hospital discharge.  Hemoglobin     A1c result was pending. 2. Hyponatremia secondary to hyperglycemia.  The patient's serum     sodium was 130 on admission and 136 at the time of hospital     discharge. 3. Left leg pain with radiation to the lower back.  No identifiable     cause was elicited during the hospital course.  Of note, during one     of his ED visits, on October 20, 2010, a CT of his abdomen and pelvis     was ordered.  There was a mention of a transitional S1 vertebra,     which pseudoarticulated with the remainder of the sacrum on the     left side.  An outpatient appointment was made for him to follow up     with Dr. [name].  DISCHARGE MEDICATIONS: 1. Lantus SoloSTAR pen.  The dose was increased from 30 units to 40     units subcu each morning. 2. Percocet 10/650 mg 1 tablet every 6 hours as needed for pain and 20     tablets were written for with no refills. 3. Amlodipine 10 mg daily. 4. Atenolol 100 mg daily. 5. Depakote ER 500 mg 1 tablet in the morning and 2 tablets at     bedtime. 6. Doxepin 50 mg at bedtime. 7. Cymbalta 60 mg daily. 8. Vitamin D 50,000 international units 1 capsule weekly. 9. Fish oil 1000 mg b.i.d. 10.Metformin 1000 mg b.i.d. 11.Hydrochlorothiazide 25 mg daily. 12.Keppra 500 mg 1 tablet in the morning and 2 tablets at bedtime. 13.Meclizine 25 mg 1 tablet every 6 hours as needed for  dizziness. 14.Xanax 1 mg four times daily.  DISCHARGE DISPOSITION:  The patient was discharged to home in improved and stable condition.  He was advised to follow up with his primary care physician in 1-2 weeks.  An appointment will be made for him to follow up with Dr. [name] in 2-3 weeks.  CONSULTATIONS:  None.  PROCEDURE PERFORMED:  None.  HISTORY OF PRESENT ILLNESS:  The patient is a 45 year old man with a past medical history significant for type 2 diabetes mellitus, seizure disorder, bipolar disorder, and obstructive sleep apnea.  He presented to the emergency department on October 25, 2010, with a chief complaint of left leg pain that radiated to the back.  When he was initially evaluated, there was no obvious tenderness to palpation of his left leg or left lower back.  His pain had resolved at the time of my initial assessment.  However, his blood glucose was found to be 682.  He was admitted for further evaluation and management.  HOSPITAL COURSE:  The patient was started on IV fluid hydration with normal saline in the emergency department.  He was continued on hydration with normal saline.  The insulin drip Glucommander was initiated in the emergency department.  Prior to him leaving the emergency department, his venous glucose had improved to the 300s. Therefore, the insulin drip was discontinued.  He was started instead on sliding scale NovoLog with the resistant scale and NPH b.i.d.  His serum sodium was noted to be 130.  Following hydration, it improved to 136 prior to hospital discharge.  Hemoglobin A1c, fasting lipid profile, and TSH were all ordered, but the results were pending at the time of hospital discharge.  His valproic acid level was therapeutic at 77.  The patient complained of leg pain during the hospitalization; however, he was able to ambulate without any difficulty.  On my exam, there was only minimal tenderness upon palpation prior to discharge.  He  did request Percocet.  I gave him a prescription for Percocet with only 20 tablets and no refills.  I referred him to Orthopedic surgeon Dr. [name] for an outpatient evaluation.  An appointment with him was pending, but will be arranged prior to the patient's discharge.  Of note, the patient did admit to some noncompliance as sometimes  forgets to give himself Lantus at bedtime.  I therefore instructed him to start taking his Lantus in the morning for better compliance.  Also, the dose was increased to 40 units each morning.  He was admonished to be more compliant with metformin therapy, as he had not taken metformin in several days.  He voiced understanding.  DISCHARGE LABORATORIES:  Magnesium 1.8, PTT 29, PT 12.6, phosphorus 4.2. Valproic acid level 77.  Sodium 136, potassium 4.1, chloride 100, CO2 of 29, glucose 282, BUN 10, creatinine 1.04, calcium 8.9, total bilirubin 0.4, alkaline phosphatase 49, SGOT 13, SGPT 19, total protein 5.8, albumin 3.4, calcium 8.6.  WBC 6.4, hemoglobin 13.6, platelet count 197.     Elliot Cousin, M.D.     DF/MEDQ  D:  10/26/2010  T:  10/26/2010  Job:  [id number]  cc:   Dr. Duard Brady, M.D. Fax: [phone]  Electronically Signed by Elliot Cousin M.D. on 10/28/2010 10:29:52 PM

## 2010-10-28 NOTE — H&P (Signed)
NAME:  [place], [place] NO.:  [id number]  MEDICAL RECORD NO.:  [id number]           PATIENT TYPE:  I  LOCATION:  A303                          FACILITY:  [facility]  PHYSICIAN:  Elliot Cousin, M.D.    DATE OF BIRTH:  09/30/1965  DATE OF ADMISSION:  10/25/2010 DATE OF DISCHARGE:  LH                             HISTORY & PHYSICAL   PRIMARY CARE PHYSICIAN:  [name] in [place].  CHIEF COMPLAINT:  Left leg pain and elevated blood sugars.  HISTORY OF PRESENT ILLNESS:  The patient is a 45 year old man with a past medical history significant for type 2 diabetes mellitus, seizure disorder, bipolar disorder, and obstructive sleep apnea.  He presents to the emergency department today with a chief complaint of left leg pain and elevated blood sugars at home.  He states that he has had leg pain for the past 3-4 days.  It starts in his foot and it radiates to his knee and then to his hip.  It then radiates to his left lower back.  He describes the pain as an aching type pain.  At its worst, it is a 10/10 in intensity.  He took Tylenol for the pain, but it did not relieve it. He did have some swelling in his knee, but the swelling has now resolved.  He denies swelling in his foot or swelling in his hip.  The pain intensifies when he tries to walk.  He was told that he may have gout.  He had taken high-dose ibuprofen in the past for low back pain, but none recently for his leg pain.  He denies any recent trauma to his left leg.  His blood sugars have been running in the 300s.  He says it is because he has not been eating right.  He has missed several doses of insulin.  He is on a diabetes pill, but he cannot recall the name of it.  He says that it is at the pharmacy and he has not picked it up yet.  He has had increased thirst and urination over the past 2-3 days.  He has had a dry mouth.  He has had loose stools numbering 4 or 5 yesterday.  He has had chills.  He denies  headache, nausea, vomiting, abdominal pain, chest pain, shortness of breath, pain with urination, bright red blood per rectum and black tarry stools.  In the emergency department, the patient is noted to be afebrile and hemodynamically stable.  His sodium is low at 130.  His glucose is elevated at 682.  His BUN is within normal limits at 11.  His serum potassium is within normal limits at 4.5.  His urinalysis has greater than 1000 glucose, otherwise negative.  A CBC was not ordered by the ED physician.  The patient is being admitted for further evaluation and management.  PAST MEDICAL HISTORY: 1. Type 2 diabetes mellitus, diagnosed in 2011. 2. Seizure disorder, secondary to a head injury when he was 45 years     of age. 3. Depressive bipolar disorder with a history of suicidal ideations in     the  past, necessitating Behavioral Health admissions with the     latest being in November 2011. 4. Obstructive sleep apnea. 5. Status post corrective jaw surgery for obstructive sleep apnea.     The patient does use CPAP. 6. Hypertension. 7. History of cocaine and alcohol abuse.   8. Laceration of his right ring finger on October 21, 2010, status post     stitches by the ED physician. 9. Status post tonsillectomy in the past. 10.Suspect noncompliance.  MEDICATIONS:  The patient cannot tell me all of the doses and/or names of his medications. 1. He takes insulin 30 units at bedtime, but he does not know the type     of insulin. 2. Diabetes pill once daily. 3. Atenolol once daily. 4. Hydrochlorothiazide once daily. 5. Amlodipine once daily. 6. Xanax 1 mg 4 times daily. 7. Cymbalta once daily. 8. Depakote 500 mg 1 tablet in the morning and 2 tablets at bedtime. 9. Keppra 500 mg 1 tablet in the morning and 2 tablets at bedtime. 10.Ibuprofen 800 mg as needed. 11.Over-the-counter Tylenol as needed.  ALLERGIES:  The patient has allergies to ASPIRIN, which causes a rash and hives.  SOCIAL  HISTORY:  The patient is single.  [place]  He has no children.  Up until recently, he smoked 2 packs of cigarettes per day.  He is now down to a third to half a pack of cigarettes per day.  He was a heavy alcohol drinker, but none in the past couple of years.  He admits to cocaine abuse in the past, but none since last year.  He denies any other illicit drugs.  Of note, the patient's urine drug screen in November 2011 was positive for cocaine.  FAMILY HISTORY:  His mother is in her 50s.  He is unaware of any significant past medical history.  His father is in his 51s.  He had a heart attack when he was in his 80s.  REVIEW OF SYSTEMS:  As above in the history present illness.  Otherwise review of systems is negative.  PHYSICAL EXAMINATION:  VITAL SIGNS:  Temperature 97.4, blood pressure 126/67, pulse 85, respiratory rate 18, oxygen saturation 96% on room air. GENERAL:  The patient is a 45 year old [place] man who is currently standing up, talking on the phone as I enter his room.  He is in no acute distress. HEENT:  Head is normocephalic, nontraumatic.  Pupils equal, round, and reactive to light.  Extraocular muscles are intact.  Conjunctivae are clear.  Sclerae are white.  Tympanic membranes on the left is clear. Tympanic membrane on the right is mildly erythematous, but no drainage or bulging.  Nasal mucosa is dry.  No sinus tenderness.  Oropharynx reveals multiple missing teeth.  Mucous membranes are dry.  No posterior exudates or erythema. NECK:  Supple.  No adenopathy, no thyromegaly, no bruit, no JVD. LUNGS:  Clear to auscultation bilaterally with decreased breath sounds in the bases. HEART:  S1 and S2 with no murmurs, rubs or gallops. ABDOMEN:  Mildly obese positive bowel sounds, soft, nontender, nondistended.  No hepatosplenomegaly.  No masses palpated. GU and RECTAL:  Deferred. EXTREMITIES:  Pedal pulses are palpable bilaterally.  No  pretibial edema and no pedal edema.  No acute hot red joints.  No tenderness over the hip joint, proximal right leg muscles or distal right leg muscles. Range of motion with hip and knee extension, flexion, internal, and external rotation elicit no pain or discomfort. NEUROLOGIC:  The patient is alert and oriented x3.  Cranial nerves II through XII are intact.  His gait is within normal limits. PSYCHOLOGIC:  The patient is alert.  He is cooperative.  His speech is not pressured.  LABORATORY DATA:  Admission laboratories; sodium 138, potassium 4.5, chloride 92, CO2 of 27, glucose 682, BUN 11, creatinine 1.28, calcium 9.4.  Urinalysis essentially negative with exception of greater than 1000 glucose.  ASSESSMENT: 1. Nonketotic hyperosmolar hyperglycemia secondary to uncontrolled     type 2 diabetes mellitus.  The patient's venous glucose is 682.     His bicarbonate is within normal limits at 27.  His anion gap is     only 11.  The patient's uncontrolled diabetes mellitus is secondary     to noncompliance and dietary indiscretion. 2. Hyponatremia.  The patient's serum sodium is 130, likely secondary     to hyperglycemia. 3. Left leg pain.  The patient reports left leg pain, but the exam is     actually within normal limits.  I did not detect any abnormalities.     He appears to be ambulating in the room without difficulty. 4. Seizure disorder.  The patient is treated with Keppra and Depakote. 5. Bipolar disorder.  The patient is treated apparently with Cymbalta,     Depakote, and Xanax. 6. Hypertension.  The patient's blood pressure is probably on the     lower end of normal given that he is treated with 3     antihypertensive medications. 7. Obstructive sleep apnea.  He is treated with CPAP nightly. 8. Status post suturing of the right ring finger laceration on October 21, 2010.  His stitches will need to be removed in a few     days. 9. Tobacco abuse.  The patient was advised to  stop completely.  PLAN: 1. The insulin drip Glucommander was started.  His blood glucose is     now in the upper 300s.  We will continue the Glucommander until he     is transferred to the floor. 2. We will start sliding scale NovoLog with resistant scale before     each meal and at bedtime and b.i.d. dosing of NPH when the     Glucommander is discontinued. 3. We will continue IV fluid hydration. 4. We will consult the registered dietitian for review of diabetes     management. 5. We will ask the respiratory therapist to administer CPAP at     bedtime. 6. For further evaluation, we will order a valproic acid level,     hemoglobin A1c, thyroid function panel, fasting lipid panel, and a     followup BMET tonight. 7. Tobacco cessation counseling. 8. We will also order a CBC, which was not ordered in the emergency     department.     Elliot Cousin, M.D.     DF/MEDQ  D:  10/25/2010  T:  10/25/2010  Job:  [id number]  cc:   [name] [facility]  Electronically Signed by Elliot Cousin M.D. on 10/28/2010 10:23:26 PM

## 2010-11-01 LAB — BASIC METABOLIC PANEL
CO2: 33 mEq/L — ABNORMAL HIGH (ref 19–32)
Calcium: 9.5 mg/dL (ref 8.4–10.5)
Chloride: 102 mEq/L (ref 96–112)
GFR calc Af Amer: >60 mL/min (ref >60)
Sodium: 141 mEq/L (ref 135–145)

## 2010-11-01 LAB — CBC
Hemoglobin: 16 g/dL (ref 13.0–17.0)
MCHC: 34.1 g/dL (ref 30.0–36.0)
RBC: 5.09 MIL/uL (ref 4.22–5.81)
WBC: 6.8 10*3/uL (ref 4.0–10.5)

## 2010-11-01 LAB — RAPID URINE DRUG SCREEN, HOSP PERFORMED
Barbiturates: NOT DETECTED
Opiates: NOT DETECTED
Tetrahydrocannabinol: POSITIVE — AB

## 2010-11-01 LAB — DIFFERENTIAL
Basophils Relative: 0 % (ref 0–1)
Lymphs Abs: 3.2 10*3/uL (ref 0.7–4.0)
Monocytes Absolute: 0.7 10*3/uL (ref 0.1–1.0)
Monocytes Relative: 10 % (ref 3–12)
Neutro Abs: 2.8 10*3/uL (ref 1.7–7.7)

## 2010-11-01 LAB — ETHANOL: Alcohol, Ethyl (B): <5 mg/dL (ref 0–10)

## 2010-11-11 LAB — STREP A DNA PROBE: Group A Strep Probe: NEGATIVE

## 2010-11-22 ENCOUNTER — Emergency Department (HOSPITAL_COMMUNITY): Payer: Medicaid Other

## 2010-11-22 ENCOUNTER — Emergency Department (HOSPITAL_COMMUNITY)
Admission: EM | Admit: 2010-11-22 | Discharge: 2010-11-22 | Disposition: A | Discharge status: Home / Self Care | Payer: Medicaid Other | Attending: Emergency Medicine | Admitting: Emergency Medicine

## 2010-11-22 DIAGNOSIS — F411 Generalized anxiety disorder: Secondary | ICD-10-CM | POA: Insufficient documentation

## 2010-11-22 DIAGNOSIS — R42 Dizziness and giddiness: Secondary | ICD-10-CM | POA: Insufficient documentation

## 2010-11-22 DIAGNOSIS — G4730 Sleep apnea, unspecified: Secondary | ICD-10-CM | POA: Insufficient documentation

## 2010-11-22 DIAGNOSIS — E119 Type 2 diabetes mellitus without complications: Secondary | ICD-10-CM | POA: Insufficient documentation

## 2010-11-22 DIAGNOSIS — G43909 Migraine, unspecified, not intractable, without status migrainosus: Secondary | ICD-10-CM | POA: Insufficient documentation

## 2010-11-22 DIAGNOSIS — G40802 Other epilepsy, not intractable, without status epilepticus: Secondary | ICD-10-CM | POA: Insufficient documentation

## 2010-11-22 DIAGNOSIS — F17200 Nicotine dependence, unspecified, uncomplicated: Secondary | ICD-10-CM | POA: Insufficient documentation

## 2010-11-22 DIAGNOSIS — M109 Gout, unspecified: Secondary | ICD-10-CM | POA: Insufficient documentation

## 2010-11-22 DIAGNOSIS — Z79899 Other long term (current) drug therapy: Secondary | ICD-10-CM | POA: Insufficient documentation

## 2010-11-22 DIAGNOSIS — M129 Arthropathy, unspecified: Secondary | ICD-10-CM | POA: Insufficient documentation

## 2010-11-22 LAB — DIFFERENTIAL
Basophils Absolute: 0 10*3/uL (ref 0.0–0.1)
Basophils Relative: 0 % (ref 0–1)
Eosinophils Absolute: 0.2 10*3/uL (ref 0.0–0.7)
Eosinophils Relative: 2 % (ref 0–5)
Monocytes Absolute: 0.5 10*3/uL (ref 0.1–1.0)

## 2010-11-22 LAB — BASIC METABOLIC PANEL
Calcium: 9.2 mg/dL (ref 8.4–10.5)
GFR calc non Af Amer: >60 mL/min (ref >60)
Glucose, Bld: 248 mg/dL — ABNORMAL HIGH (ref 70–99)
Potassium: 3.7 mEq/L (ref 3.5–5.1)
Sodium: 136 mEq/L (ref 135–145)

## 2010-11-22 LAB — CBC
MCHC: 34.7 g/dL (ref 30.0–36.0)
Platelets: 163 10*3/uL (ref 150–400)
RDW: 12.7 % (ref 11.5–15.5)
WBC: 8.1 10*3/uL (ref 4.0–10.5)

## 2010-11-22 LAB — GLUCOSE, CAPILLARY: Glucose-Capillary: 279 mg/dL — ABNORMAL HIGH (ref 70–99)

## 2010-12-06 ENCOUNTER — Emergency Department (HOSPITAL_COMMUNITY)
Admission: EM | Admit: 2010-12-06 | Discharge: 2010-12-06 | Disposition: A | Discharge status: Home / Self Care | Payer: Medicaid Other | Attending: Emergency Medicine | Admitting: Emergency Medicine

## 2010-12-06 ENCOUNTER — Emergency Department (HOSPITAL_COMMUNITY): Payer: Medicaid Other

## 2010-12-06 DIAGNOSIS — Y92009 Unspecified place in unspecified non-institutional (private) residence as the place of occurrence of the external cause: Secondary | ICD-10-CM | POA: Insufficient documentation

## 2010-12-06 DIAGNOSIS — X500XXA Overexertion from strenuous movement or load, initial encounter: Secondary | ICD-10-CM | POA: Insufficient documentation

## 2010-12-06 DIAGNOSIS — M25569 Pain in unspecified knee: Secondary | ICD-10-CM | POA: Insufficient documentation

## 2010-12-06 DIAGNOSIS — S8990XA Unspecified injury of unspecified lower leg, initial encounter: Secondary | ICD-10-CM | POA: Insufficient documentation

## 2010-12-10 ENCOUNTER — Emergency Department (HOSPITAL_COMMUNITY)
Admission: EM | Admit: 2010-12-10 | Discharge: 2010-12-10 | Disposition: A | Discharge status: Home / Self Care | Payer: Medicaid Other | Attending: Emergency Medicine | Admitting: Emergency Medicine

## 2010-12-10 DIAGNOSIS — I10 Essential (primary) hypertension: Secondary | ICD-10-CM | POA: Insufficient documentation

## 2010-12-10 DIAGNOSIS — G40909 Epilepsy, unspecified, not intractable, without status epilepticus: Secondary | ICD-10-CM | POA: Insufficient documentation

## 2010-12-10 DIAGNOSIS — M25569 Pain in unspecified knee: Secondary | ICD-10-CM | POA: Insufficient documentation

## 2010-12-10 DIAGNOSIS — F411 Generalized anxiety disorder: Secondary | ICD-10-CM | POA: Insufficient documentation

## 2010-12-10 DIAGNOSIS — E119 Type 2 diabetes mellitus without complications: Secondary | ICD-10-CM | POA: Insufficient documentation

## 2010-12-14 NOTE — Procedures (Signed)
NAME:  [place], [place] NO.:  [id number]   MEDICAL RECORD NO.:  [id number]          PATIENT TYPE:  OUT   LOCATION:  SLEE                          FACILITY:  [facility]   PHYSICIAN:  Kofi A. Gerilyn Pilgrim, M.D. DATE OF BIRTH:  1965/10/08   DATE OF PROCEDURE:  02/13/2009  DATE OF DISCHARGE:  02/13/2009                             SLEEP DISORDER REPORT   INDICATIONS:  A 45 year old man who presents with hypersomnia, snoring,  and has been evaluated for obstructive sleep apnea syndrome.   MEDICATIONS:  Zanaflex, atenolol, Depakote, Motrin, Zyrtec,  hydrochlorothiazide, Norvasc, Zoloft, Prozac.   Epworth sleepiness scale of 17.  BMI 32.   ARCHITECTURAL SUMMARY:  The total recording time was 668.5 minutes.  The  sleep efficiency is 92.3%.  Sleep latency 5 minutes.  REM latency 217.5  minutes.  Stage N1 16.3%, N2 56.5%, N3 21%, and REM sleep 6.2%.   RESPIRATORY SUMMARY:  The baseline oxygen saturation is 98% and lowest  saturation 86%.  AHI is 9.   LIMB MOVEMENT SUMMARY:  PLM index 0.   ELECTROCARDIOGRAM SUMMARY:  Average heart rate is 58 with no significant  dysrhythmias observed.   IMPRESSION:  Mild obstructive sleep apnea syndrome.      Kofi A. Gerilyn Pilgrim, M.D.  Electronically Signed     KAD/MEDQ  D:  02/16/2009  T:  02/17/2009  Job:  [id number]

## 2010-12-14 NOTE — H&P (Signed)
NAME:  [place], [place] NO.:  [id number]   MEDICAL RECORD NO.:  [id number]          PATIENT TYPE:  IPS   LOCATION:  0405                          FACILITY:  [facility]   PHYSICIAN:  Anselm Jungling, MD  DATE OF BIRTH:  [birth year]   DATE OF ADMISSION:  04/01/2008  DATE OF DISCHARGE:                       PSYCHIATRIC ADMISSION ASSESSMENT   DATE/TIME OF ASSESSMENT:  April 02, 2008, at 11:15 a.m.   IDENTIFYING INFORMATION:  A 45 year old single African American male.  This is an involuntary admission.   HISTORY OF PRESENT ILLNESS:  First [facility] admission for this 45 year old,  who reports suicidal thoughts for the past 3 days after the girlfriend  broke up with him over the weekend.  Her family had spoken with her that  they disapproved of their relationship.  She told him that it was a  matter of choosing between him or her family and she chose her family.  He is quite upset about the situation.  Says that he really loved her,  did not want to end the relationship.  She told him she wants to just be  friends, and he feels he cannot cope with this.  He is thinking about  possibly stabbing himself and reports a history of prior suicide  attempts in the past.  Denies homicidal thoughts.  Says he has been  using cocaine for approximately 2 weeks due to stress over this  relationship.  Previously abstinent for 2 months and is vague about his  pattern prior to then.   PAST PSYCHIATRIC HISTORY:  First [facility] admission.  He endorses a history  of cocaine abuse in the past. No current outpatient care.  No current  treatment for depression.   SOCIAL HISTORY:  Single African American male, not currently working.  Living with his grandmother and has lived there since he was 68 years  old.  Says that he cannot work due to his history of epilepsy and  migraine headaches.  Previously was doing some side work for extra cash,  currently has no income.  He has a basic education, completed  the  twelfth grade.  He denies any legal problems.  He denies a family  history of mental illness or substance abuse.   MEDICAL HISTORY:  Primary care practitioner is Dr. Darleen Crocker A. [place], his  neurologist.  Last seen there March 17, 2008, and Dr. Ronal Fear most  recent note is in the record.  He follows the patient for hypertension,  epilepsy, headache NOS and monitoring of dangerous drug, also sleep  apnea and uses a CPAP machine.  Last seizure he reports was 2 days ago.   CURRENT MEDICATIONS:  1. Atenolol 100 mg daily.  2. Hydrochlorothiazide 25 mg daily.  3. Norvasc 10 mg daily.  4. Depakote 500 mg 2 tablets q.a.m., 3 tablets q.h.s.  5. Keppra XR 500 mg 3 tablets daily.  6. Nortriptyline 500 mg 1 q.p.m.  7. Zyrtec 10 mg daily.  8. Xanax 1 mg q.h.s.  9. Nasonex 50 mcg dose, 2 sprays in each nostril daily.  10.Soma 350 mg 3 times a day as needed.  11.Relafen 750  mg b.i.d.  12.Robaxin 750 mg t.i.d.  13.Vicodin 10 mg q.12 h p.r.n. as needed for a severe headache.   DRUG ALLERGIES:  ASPIRIN, WHICH CAUSES A RASH.   Medications were validated with Dr. Darleen Crocker A. [place]'s office, who has  faxed his last note and medication list.   PHYSICAL EXAMINATION:  Physical examination was done in the emergency  room and is noted in the record.  A healthy-appearing African American  male in no distress.   Diagnostic studies were remarkable for a valproate level of 69, alcohol  level less than 5.  CBC is normal.  Chemistry within normal limits.  Urine drug screen positive for marijuana, benzodiazepines, and cocaine.   MENTAL STATUS EXAM:  Fully alert male.  Cooperative.  Blunted affect.  Fully coherent.  Does not appear to be internally distracted.  Oriented  x4.  He is polite, open.  Quite depressed, somewhat grim affect.  No  signs and symptoms of psychosis or thought disorder.  Asking for help.  Feeling depressed.  Gives an okay to contact his sister.   INITIAL IMPRESSION:  AXIS I:   Major depressive disorder, recurrent.  Polysubstance abuse.  AXIS II:  No diagnosis.  AXIS III:  Epilepsy.  Migraine headache.  Hypertension.  AXIS IV:  Severe issues with relationship breakup.  AXIS V:  Current 46, past year not known.   The plan is to voluntarily admit him to our dual diagnosis program.  At  this point, we are going to discontinue his Xanax, but we will continue  his other medications.  We have placed him on a Librium protocol.  We  will continue all of his other medications for seizure management.  He  has agreed to consider a trial of Prozac, and we will get him started on  that,  and we will evaluate his support system.      Margaret A. Lorin Picket, N.P.      Anselm Jungling, MD  Electronically Signed    MAS/MEDQ  D:  04/02/2008  T:  04/02/2008  Job:  [id number]
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
## 2010-12-28 ENCOUNTER — Encounter: Payer: Self-pay | Admitting: Orthopedic Surgery

## 2010-12-28 ENCOUNTER — Ambulatory Visit (INDEPENDENT_AMBULATORY_CARE_PROVIDER_SITE_OTHER): Payer: Medicaid Other | Admitting: Orthopedic Surgery

## 2010-12-28 VITALS — HR 76 | Resp 16 | Ht 68.0 in | Wt 214.0 lb

## 2010-12-28 DIAGNOSIS — M25569 Pain in unspecified knee: Secondary | ICD-10-CM

## 2010-12-28 DIAGNOSIS — M23329 Other meniscus derangements, posterior horn of medial meniscus, unspecified knee: Secondary | ICD-10-CM

## 2010-12-28 DIAGNOSIS — G8929 Other chronic pain: Secondary | ICD-10-CM | POA: Insufficient documentation

## 2010-12-28 NOTE — Patient Instructions (Signed)
Follow-up after MRI

## 2010-12-28 NOTE — Progress Notes (Signed)
LEFT knee pain.  35 her old male history of seizure disorder anxiety depression diabetes hypertension and sleep apnea status post jaw procedure for sleep apnea presents with a 4 month history of medial knee pain after twisting injury when he fell off of a deck/porch while he was having a seizure.  Now presents with severe throbbing constant medial knee pain associated with locking unrelieved by Vicodin 5 mg, relieved by Percocet 7.5 mg.  Other associated signs or swelling and cold intolerance.  Review of systems fatigue and blurred vision hearing chest pain snoring tightness of chest constipation diarrhea flank pain joint pain joint swelling stiffness muscle pain, itching, dizziness tremor seizure, nervousness anxiety depression hallucinations, easy bruising.  He can cold intolerance especially cold intolerance LEFT knee, seasonal ALLERGIES.  Height 5 foot 8 inches, weight 214 pounds her vitals recorded above  GENERAL: normal development   CDV: pulses are normal   Skin: normal  Lymph: deferred  Psychiatric: awake, alert and oriented  Neuro: normal sensation  MSK   The ambulation x-rays seems normal  Chest tenderness over the medial joint line of the LEFT knee, no joint effusion.  Painful range of motion of the knee.  He is stable.  Strength is normal skin is intact.  The medial meniscal tests for tear including McMurray sign positive.  The RIGHT knee has full range of motion no swelling no tenderness, knee is stable.  Strength is normal skin contact  Bilateral normal pulse and temperature and negative lymph nodes bilaterally  Normal sensation bilaterally  Deep tendon reflexes bilaterally Normal  Coordination balance normal  X-ray reports and films indicate no acute disease and no arthritis  Impression torn medial meniscus  Recommend MRI  Recommend stopping Percocet 7.5, this will lead to chronic pain management problems.   X-rays were done in the hospital in May, no  acute fracture, or dislocation seen.

## 2011-01-04 ENCOUNTER — Emergency Department (HOSPITAL_COMMUNITY)
Admission: EM | Admit: 2011-01-04 | Discharge: 2011-01-04 | Disposition: A | Discharge status: Home / Self Care | Payer: Medicaid Other | Attending: Emergency Medicine | Admitting: Emergency Medicine

## 2011-01-04 DIAGNOSIS — M25569 Pain in unspecified knee: Secondary | ICD-10-CM | POA: Insufficient documentation

## 2011-01-04 DIAGNOSIS — F17200 Nicotine dependence, unspecified, uncomplicated: Secondary | ICD-10-CM | POA: Insufficient documentation

## 2011-01-04 LAB — GLUCOSE, CAPILLARY: Glucose-Capillary: 185 mg/dL — ABNORMAL HIGH (ref 70–99)

## 2011-01-05 ENCOUNTER — Ambulatory Visit (HOSPITAL_COMMUNITY)
Admission: RE | Admit: 2011-01-05 | Discharge: 2011-01-05 | Disposition: A | Discharge status: Home / Self Care | Payer: Medicaid Other | Source: Ambulatory Visit | Attending: Orthopedic Surgery | Admitting: Orthopedic Surgery

## 2011-01-05 DIAGNOSIS — M25569 Pain in unspecified knee: Secondary | ICD-10-CM

## 2011-01-05 DIAGNOSIS — M23329 Other meniscus derangements, posterior horn of medial meniscus, unspecified knee: Secondary | ICD-10-CM | POA: Insufficient documentation

## 2011-01-05 DIAGNOSIS — M25469 Effusion, unspecified knee: Secondary | ICD-10-CM | POA: Insufficient documentation

## 2011-01-13 ENCOUNTER — Ambulatory Visit (INDEPENDENT_AMBULATORY_CARE_PROVIDER_SITE_OTHER): Payer: Medicaid Other | Admitting: Orthopedic Surgery

## 2011-01-13 ENCOUNTER — Telehealth: Payer: Self-pay | Admitting: Orthopedic Surgery

## 2011-01-13 DIAGNOSIS — M23305 Other meniscus derangements, unspecified medial meniscus, unspecified knee: Secondary | ICD-10-CM | POA: Insufficient documentation

## 2011-01-13 DIAGNOSIS — M238X9 Other internal derangements of unspecified knee: Secondary | ICD-10-CM | POA: Insufficient documentation

## 2011-01-13 DIAGNOSIS — M25569 Pain in unspecified knee: Secondary | ICD-10-CM

## 2011-01-13 MED ORDER — METHYLPREDNISOLONE ACETATE 40 MG/ML IJ SUSP: 40.0000 mg | Freq: Once | INTRAMUSCULAR | Status: DC

## 2011-01-13 MED ORDER — HYDROCODONE-ACETAMINOPHEN 5-325 MG PO TABS: 1.0000 | ORAL_TABLET | Freq: Four times a day (QID) | ORAL | Status: AC | PRN

## 2011-01-13 NOTE — Telephone Encounter (Signed)
Advised to ice, wrap, elevate, rest, ibuprofen

## 2011-01-13 NOTE — Procedures (Signed)
Injection LEFT knee.  Consent was obtained.  Time out was taken   LEFT knee was injected with Depo-Medrol 40 mg plus lidocaine 1% 4 cc.  Knee was prepped with alcohol and anesthetized with ethyl chloride.  The injection was tolerated without complication.   

## 2011-01-13 NOTE — Patient Instructions (Signed)
Obtain MRI reprt of back   Wear brace   You have received a steroid shot. 15% of patients experience increased pain at the injection site with in the next 24 hours. This is best treated with ice and tylenol extra strength 2 tabs every 8 hours. If you are still having pain please call the office.

## 2011-01-13 NOTE — Progress Notes (Signed)
   Followup  MRI  MRI shows questionable meniscal tear  I do not think the patient is a good surgical candidate.  I discussed this with him.  I offered him injection and brace.  LEFT knee and joint injection  Patient is followed Neurologist: @ [facility]

## 2011-01-18 ENCOUNTER — Telehealth: Payer: Self-pay | Admitting: Orthopedic Surgery

## 2011-01-18 ENCOUNTER — Emergency Department (HOSPITAL_COMMUNITY)
Admission: EM | Admit: 2011-01-18 | Discharge: 2011-01-18 | Disposition: A | Discharge status: Home / Self Care | Payer: Medicaid Other | Source: Home / Self Care | Attending: Emergency Medicine | Admitting: Emergency Medicine

## 2011-01-18 ENCOUNTER — Emergency Department (HOSPITAL_COMMUNITY): Payer: Medicaid Other

## 2011-01-18 ENCOUNTER — Emergency Department (HOSPITAL_COMMUNITY)
Admission: EM | Admit: 2011-01-18 | Discharge: 2011-01-18 | Disposition: A | Discharge status: Home / Self Care | Payer: Medicaid Other | Attending: Emergency Medicine | Admitting: Emergency Medicine

## 2011-01-18 DIAGNOSIS — M25569 Pain in unspecified knee: Secondary | ICD-10-CM | POA: Insufficient documentation

## 2011-01-18 DIAGNOSIS — F17200 Nicotine dependence, unspecified, uncomplicated: Secondary | ICD-10-CM | POA: Insufficient documentation

## 2011-01-18 DIAGNOSIS — M109 Gout, unspecified: Secondary | ICD-10-CM | POA: Insufficient documentation

## 2011-01-18 DIAGNOSIS — I10 Essential (primary) hypertension: Secondary | ICD-10-CM | POA: Insufficient documentation

## 2011-01-18 DIAGNOSIS — Z79899 Other long term (current) drug therapy: Secondary | ICD-10-CM | POA: Insufficient documentation

## 2011-01-18 DIAGNOSIS — W108XXA Fall (on) (from) other stairs and steps, initial encounter: Secondary | ICD-10-CM | POA: Insufficient documentation

## 2011-01-18 DIAGNOSIS — G43909 Migraine, unspecified, not intractable, without status migrainosus: Secondary | ICD-10-CM | POA: Insufficient documentation

## 2011-01-18 DIAGNOSIS — E119 Type 2 diabetes mellitus without complications: Secondary | ICD-10-CM | POA: Insufficient documentation

## 2011-01-18 DIAGNOSIS — F411 Generalized anxiety disorder: Secondary | ICD-10-CM | POA: Insufficient documentation

## 2011-01-18 DIAGNOSIS — Z794 Long term (current) use of insulin: Secondary | ICD-10-CM | POA: Insufficient documentation

## 2011-01-18 DIAGNOSIS — F3289 Other specified depressive episodes: Secondary | ICD-10-CM | POA: Insufficient documentation

## 2011-01-18 DIAGNOSIS — G4730 Sleep apnea, unspecified: Secondary | ICD-10-CM | POA: Insufficient documentation

## 2011-01-18 DIAGNOSIS — F329 Major depressive disorder, single episode, unspecified: Secondary | ICD-10-CM | POA: Insufficient documentation

## 2011-01-18 NOTE — Telephone Encounter (Signed)
Melvin Taylor went to the ER this morning, said he fell and went to get his knee checked out.  They gave him crutches and he says he is in a lot of pain.  Said the er did not prescribe any pain medicine and now he is taking the Norco 5/325 1 every  6 hours, (that you prescribed) and not helping his pain.  He asked if you will increase the pain medicine.  He uses [facility] in [place] His # [phone]

## 2011-01-19 ENCOUNTER — Emergency Department (HOSPITAL_COMMUNITY)
Admission: EM | Admit: 2011-01-19 | Discharge: 2011-01-19 | Disposition: A | Discharge status: Home / Self Care | Payer: Medicaid Other | Attending: Emergency Medicine | Admitting: Emergency Medicine

## 2011-01-19 DIAGNOSIS — I10 Essential (primary) hypertension: Secondary | ICD-10-CM | POA: Insufficient documentation

## 2011-01-19 DIAGNOSIS — E119 Type 2 diabetes mellitus without complications: Secondary | ICD-10-CM | POA: Insufficient documentation

## 2011-01-19 DIAGNOSIS — Z794 Long term (current) use of insulin: Secondary | ICD-10-CM | POA: Insufficient documentation

## 2011-01-19 DIAGNOSIS — M25569 Pain in unspecified knee: Secondary | ICD-10-CM | POA: Insufficient documentation

## 2011-01-19 DIAGNOSIS — M109 Gout, unspecified: Secondary | ICD-10-CM | POA: Insufficient documentation

## 2011-01-19 DIAGNOSIS — G40909 Epilepsy, unspecified, not intractable, without status epilepticus: Secondary | ICD-10-CM | POA: Insufficient documentation

## 2011-01-19 DIAGNOSIS — F341 Dysthymic disorder: Secondary | ICD-10-CM | POA: Insufficient documentation

## 2011-01-19 NOTE — Telephone Encounter (Signed)
No

## 2011-01-19 NOTE — Telephone Encounter (Signed)
Tried to call , did not have the option to leave message before the phone cut off

## 2011-02-01 ENCOUNTER — Emergency Department (HOSPITAL_COMMUNITY)
Admission: EM | Admit: 2011-02-01 | Discharge: 2011-02-01 | Disposition: A | Discharge status: Home / Self Care | Payer: Medicaid Other | Attending: Emergency Medicine | Admitting: Emergency Medicine

## 2011-02-01 DIAGNOSIS — F411 Generalized anxiety disorder: Secondary | ICD-10-CM | POA: Insufficient documentation

## 2011-02-01 DIAGNOSIS — I10 Essential (primary) hypertension: Secondary | ICD-10-CM | POA: Insufficient documentation

## 2011-02-01 DIAGNOSIS — G40909 Epilepsy, unspecified, not intractable, without status epilepticus: Secondary | ICD-10-CM | POA: Insufficient documentation

## 2011-02-01 DIAGNOSIS — G43909 Migraine, unspecified, not intractable, without status migrainosus: Secondary | ICD-10-CM | POA: Insufficient documentation

## 2011-02-01 DIAGNOSIS — Z79899 Other long term (current) drug therapy: Secondary | ICD-10-CM | POA: Insufficient documentation

## 2011-02-01 DIAGNOSIS — H5316 Psychophysical visual disturbances: Secondary | ICD-10-CM | POA: Insufficient documentation

## 2011-02-01 DIAGNOSIS — Z794 Long term (current) use of insulin: Secondary | ICD-10-CM | POA: Insufficient documentation

## 2011-02-01 DIAGNOSIS — M109 Gout, unspecified: Secondary | ICD-10-CM | POA: Insufficient documentation

## 2011-02-01 DIAGNOSIS — F319 Bipolar disorder, unspecified: Secondary | ICD-10-CM | POA: Insufficient documentation

## 2011-02-01 LAB — URINALYSIS, ROUTINE W REFLEX MICROSCOPIC
Bilirubin Urine: NEGATIVE
Nitrite: NEGATIVE
Protein, ur: NEGATIVE mg/dL
Specific Gravity, Urine: 1.015 (ref 1.005–1.030)
Urobilinogen, UA: 0.2 mg/dL (ref 0.0–1.0)

## 2011-02-01 LAB — CBC
Hemoglobin: 15.3 g/dL (ref 13.0–17.0)
RBC: 5 MIL/uL (ref 4.22–5.81)
WBC: 7.1 10*3/uL (ref 4.0–10.5)

## 2011-02-01 LAB — COMPREHENSIVE METABOLIC PANEL
Albumin: 3.8 g/dL (ref 3.5–5.2)
BUN: 23 mg/dL (ref 6–23)
Chloride: 102 mEq/L (ref 96–112)
Creatinine, Ser: 1.11 mg/dL (ref 0.50–1.35)
GFR calc Af Amer: >60 mL/min (ref >60)
Glucose, Bld: 270 mg/dL — ABNORMAL HIGH (ref 70–99)
Total Bilirubin: 0.3 mg/dL (ref 0.3–1.2)
Total Protein: 7.3 g/dL (ref 6.0–8.3)

## 2011-02-01 LAB — RAPID URINE DRUG SCREEN, HOSP PERFORMED
Amphetamines: NOT DETECTED
Cocaine: POSITIVE — AB
Opiates: NOT DETECTED
Tetrahydrocannabinol: NOT DETECTED

## 2011-02-01 LAB — ETHANOL: Alcohol, Ethyl (B): <11 mg/dL (ref 0–11)

## 2011-02-01 LAB — DIFFERENTIAL
Basophils Absolute: 0 10*3/uL (ref 0.0–0.1)
Basophils Relative: 0 % (ref 0–1)
Neutro Abs: 3 10*3/uL (ref 1.7–7.7)
Neutrophils Relative %: 42 % — ABNORMAL LOW (ref 43–77)

## 2011-02-01 LAB — VALPROIC ACID LEVEL: Valproic Acid Lvl: 69.4 ug/mL (ref 50.0–100.0)

## 2011-02-02 ENCOUNTER — Emergency Department (HOSPITAL_COMMUNITY)
Admission: EM | Admit: 2011-02-02 | Discharge: 2011-02-02 | Disposition: A | Discharge status: Home / Self Care | Payer: Medicaid Other | Attending: Emergency Medicine | Admitting: Emergency Medicine

## 2011-02-02 DIAGNOSIS — M109 Gout, unspecified: Secondary | ICD-10-CM | POA: Insufficient documentation

## 2011-02-02 DIAGNOSIS — F319 Bipolar disorder, unspecified: Secondary | ICD-10-CM | POA: Insufficient documentation

## 2011-02-02 DIAGNOSIS — R51 Headache: Secondary | ICD-10-CM | POA: Insufficient documentation

## 2011-02-02 DIAGNOSIS — I10 Essential (primary) hypertension: Secondary | ICD-10-CM | POA: Insufficient documentation

## 2011-02-02 DIAGNOSIS — Z79899 Other long term (current) drug therapy: Secondary | ICD-10-CM | POA: Insufficient documentation

## 2011-02-02 DIAGNOSIS — E119 Type 2 diabetes mellitus without complications: Secondary | ICD-10-CM | POA: Insufficient documentation

## 2011-02-02 DIAGNOSIS — R569 Unspecified convulsions: Secondary | ICD-10-CM | POA: Insufficient documentation

## 2011-02-02 DIAGNOSIS — F411 Generalized anxiety disorder: Secondary | ICD-10-CM | POA: Insufficient documentation

## 2011-02-02 LAB — BASIC METABOLIC PANEL
BUN: 16 mg/dL (ref 6–23)
Calcium: 9 mg/dL (ref 8.4–10.5)
Creatinine, Ser: 1.09 mg/dL (ref 0.50–1.35)
GFR calc Af Amer: >60 mL/min (ref >60)
GFR calc non Af Amer: >60 mL/min (ref >60)
Glucose, Bld: 476 mg/dL — ABNORMAL HIGH (ref 70–99)
Potassium: 5 mEq/L (ref 3.5–5.1)

## 2011-02-21 ENCOUNTER — Emergency Department (HOSPITAL_COMMUNITY)
Admission: EM | Admit: 2011-02-21 | Discharge: 2011-02-21 | Disposition: A | Discharge status: Home / Self Care | Payer: Medicaid Other | Attending: Emergency Medicine | Admitting: Emergency Medicine

## 2011-02-21 ENCOUNTER — Encounter (HOSPITAL_COMMUNITY): Payer: Self-pay | Admitting: Emergency Medicine

## 2011-02-21 DIAGNOSIS — F17200 Nicotine dependence, unspecified, uncomplicated: Secondary | ICD-10-CM | POA: Insufficient documentation

## 2011-02-21 DIAGNOSIS — M25569 Pain in unspecified knee: Secondary | ICD-10-CM | POA: Insufficient documentation

## 2011-02-21 DIAGNOSIS — G8918 Other acute postprocedural pain: Secondary | ICD-10-CM | POA: Insufficient documentation

## 2011-02-21 HISTORY — DX: Bipolar disorder, unspecified: F31.9

## 2011-02-21 MED ORDER — HYDROMORPHONE HCL 1 MG/ML IJ SOLN
1.0000 mg | Freq: Once | INTRAMUSCULAR | Status: AC
  Administered 2011-02-21: 1 mg via INTRAMUSCULAR
  Filled 2011-02-21: qty 1

## 2011-02-21 MED ORDER — ONDANSETRON 8 MG PO TBDP
8.0000 mg | ORAL_TABLET | Freq: Once | ORAL | Status: AC
  Administered 2011-02-21: 8 mg via ORAL
  Filled 2011-02-21: qty 1

## 2011-02-21 NOTE — ED Notes (Signed)
Pt c/o left knee post operative knee pain. Pt has been seen here several times for the same. Pt states he had surgery Friday and the orthopedic told him to come here.

## 2011-02-21 NOTE — ED Notes (Signed)
Pt has three incisions to left knee from recent knee surgery

## 2011-02-21 NOTE — ED Notes (Signed)
Pt states that he had left knee surgery done on Friday, pain uncontrolled during the night, was advised to come to er by orthopedic, cms intact, pt has cryocuff in place, bandage in place under the cuff,

## 2011-02-21 NOTE — ED Provider Notes (Signed)
History     Chief Complaint  Patient presents with  . Knee Pain   HPI Comments: Patient with hx of chronic left knee pain who had arthroscopic surgery to left knee at [facility] by Dr. Chaney Malling three days ago.  States he is taking percocet but it's not controlling the pain.  States he called Dr. Andreas Blower office and was advised that doctor was in surgery and to come to ED for pain control.  He denies numbness, fever, weakness or swelling.  Patient is a 45 y.o. male presenting with knee pain. The history is provided by the patient.  Knee Pain This is a chronic problem. The current episode started in the past 7 days. The problem occurs constantly. The problem has been unchanged. Associated symptoms include arthralgias. Pertinent negatives include no abdominal pain, chest pain, chills, diaphoresis, fever, joint swelling, nausea, neck pain, numbness, vomiting or weakness. The symptoms are aggravated by twisting, walking and standing. He has tried oral narcotics for the symptoms. The treatment provided no relief.    Past Medical History  Diagnosis Date  . Seizures   . Anxiety   . Depression   . Diabetes mellitus   . HTN (hypertension)   . Sleep apnea   . Bipolar 1 disorder     Past Surgical History  Procedure Date  . Mandible fracture surgery   . Knee surgery   . Tonsillectomy     Family History  Problem Relation Age of Onset  . Arthritis    . Diabetes    . Asthma      History  Substance Use Topics  . Smoking status: Current Everyday Smoker -- 1.0 packs/day    Types: Cigarettes  . Smokeless tobacco: Not on file  . Alcohol Use: No      Review of Systems  Constitutional: Negative for fever, chills and diaphoresis.  HENT: Negative for neck pain.   Respiratory: Negative for chest tightness and wheezing.   Cardiovascular: Negative for chest pain.  Gastrointestinal: Negative for nausea, vomiting and abdominal pain.  Genitourinary: Negative for dysuria and  hematuria.  Musculoskeletal: Positive for arthralgias. Negative for joint swelling.  Skin: Positive for wound.  Neurological: Negative for dizziness, weakness and numbness.  Hematological: Does not bruise/bleed easily.    Physical Exam  BP 129/88  Pulse 65  Temp(Src) 98.5 F (36.9 C) (Oral)  Resp 20  Ht 5\' 7"  (1.702 m)  Wt 205 lb (92.987 kg)  BMI 32.11 kg/m2  SpO2 96%  Physical Exam  Nursing note and vitals reviewed. Constitutional: He is oriented to person, place, and time. He appears well-developed and well-nourished. No distress.  HENT:  Head: Normocephalic.  Eyes: Pupils are equal, round, and reactive to light.  Neck: Normal range of motion.  Cardiovascular: Normal rate, regular rhythm and normal heart sounds.   Pulmonary/Chest: Effort normal and breath sounds normal. He has no wheezes. He exhibits no tenderness.  Abdominal: Soft. There is no tenderness.  Musculoskeletal: He exhibits tenderness. He exhibits no edema.       Left knee: He exhibits no swelling, no effusion, no ecchymosis and no erythema. tenderness found. Patellar tendon tenderness noted.       Legs:      Three small surgical incisions to the anterior left knee.  Diffuse ttp of the anterior knee.  Pt has limited ROM secondary to pain  Lymphadenopathy:    He has no cervical adenopathy.  Neurological: He is alert and oriented to person, place, and time. He exhibits  normal muscle tone. Coordination normal.  Skin: Skin is warm and dry. No rash noted. No erythema.  Psychiatric: He has a normal mood and affect.    ED Course  Procedures  MDM  Patient ambulating with a cane.  Three small surgical incisions to the left knee. No obvious effusion, red streaks, or edema surrounding the knee.  Slight bloody drainage present from the incisions.  Generalized ttp of the anterior left knee.  Pt's distal sensation intact, DP pulse is strong, CR<2 sec.  Previous ED visits were reviewed by me      Ketra Duchesne L. Yahshua Thibault,  [place] 02/21/11 1710  Marylin Lathon L. [place], [place] 02/21/11 1712

## 2011-02-22 NOTE — ED Provider Notes (Signed)
Medical screening examination/treatment/procedure(s) were performed by non-physician practitioner and as supervising physician I was immediately available for consultation/collaboration.   Charles B. Sheldon, MD 02/22/11 1404 

## 2011-03-08 ENCOUNTER — Emergency Department (HOSPITAL_COMMUNITY)
Admission: EM | Admit: 2011-03-08 | Discharge: 2011-03-08 | Disposition: A | Discharge status: Home / Self Care | Payer: Medicaid Other | Attending: Emergency Medicine | Admitting: Emergency Medicine

## 2011-03-08 ENCOUNTER — Encounter (HOSPITAL_COMMUNITY): Payer: Self-pay | Admitting: Emergency Medicine

## 2011-03-08 ENCOUNTER — Telehealth: Payer: Self-pay | Admitting: Orthopedic Surgery

## 2011-03-08 ENCOUNTER — Emergency Department (HOSPITAL_COMMUNITY)
Admission: EM | Admit: 2011-03-08 | Discharge: 2011-03-08 | Disposition: A | Discharge status: Home / Self Care | Payer: Medicaid Other | Source: Home / Self Care | Attending: Emergency Medicine | Admitting: Emergency Medicine

## 2011-03-08 DIAGNOSIS — W010XXA Fall on same level from slipping, tripping and stumbling without subsequent striking against object, initial encounter: Secondary | ICD-10-CM

## 2011-03-08 DIAGNOSIS — Y92009 Unspecified place in unspecified non-institutional (private) residence as the place of occurrence of the external cause: Secondary | ICD-10-CM | POA: Insufficient documentation

## 2011-03-08 DIAGNOSIS — Z711 Person with feared health complaint in whom no diagnosis is made: Secondary | ICD-10-CM | POA: Insufficient documentation

## 2011-03-08 DIAGNOSIS — E119 Type 2 diabetes mellitus without complications: Secondary | ICD-10-CM | POA: Insufficient documentation

## 2011-03-08 DIAGNOSIS — Z79899 Other long term (current) drug therapy: Secondary | ICD-10-CM | POA: Insufficient documentation

## 2011-03-08 DIAGNOSIS — M25559 Pain in unspecified hip: Secondary | ICD-10-CM | POA: Insufficient documentation

## 2011-03-08 DIAGNOSIS — F319 Bipolar disorder, unspecified: Secondary | ICD-10-CM | POA: Insufficient documentation

## 2011-03-08 DIAGNOSIS — W06XXXA Fall from bed, initial encounter: Secondary | ICD-10-CM | POA: Insufficient documentation

## 2011-03-08 DIAGNOSIS — F341 Dysthymic disorder: Secondary | ICD-10-CM | POA: Insufficient documentation

## 2011-03-08 HISTORY — DX: Insomnia, unspecified: G47.00

## 2011-03-08 MED ORDER — OXYCODONE-ACETAMINOPHEN 5-325 MG PO TABS
1.0000 | ORAL_TABLET | Freq: Once | ORAL | Status: AC
  Administered 2011-03-08: 1 via ORAL
  Filled 2011-03-08: qty 1

## 2011-03-08 NOTE — ED Notes (Signed)
In and Out cath performed by [facility] NT. 380 ML out. Dr Bebe Shaggy notified.

## 2011-03-08 NOTE — ED Notes (Signed)
Pt a/ox4. Resp even and unlabored. NAD at this time. D/C instructions reviewed with pt. Pt verbalized understanding. Pt ambulated with steady gate with cane

## 2011-03-08 NOTE — ED Provider Notes (Signed)
History   Chart scribed for Joya Gaskins, MD by Enos Fling; the patient was seen in room APA11/APA11; this patient's care was started at 3:00 PM.    CSN: [id number] Arrival date & time: 03/08/2011  1:38 PM  Chief Complaint  Patient presents with  . Urinary Retention    Pt here this am, given Percocet, now unable to urinate   HPI Melvin Taylor is a 45 y.o. male who presents to the Emergency Department complaining of urinary retention. Pt seen in ED this AM with c/o back pain s/p fall. Pt d/c and returned to ED with c/o urinary retention stating it is caused by percocet. Pt states last voided last night. Pt states he does not remember being in the ED this AM, states he feels confused and thinks he may have had a seizure. Denies tongue biting or urinary incontinence. No abd pain, flank pain, n/v/d, or f/c.  PAST MEDICAL HISTORY:  Past Medical History  Diagnosis Date  . Seizures   . Anxiety   . Depression   . Diabetes mellitus   . HTN (hypertension)   . Sleep apnea   . Bipolar 1 disorder   . Insomnia      PAST SURGICAL HISTORY:  Past Surgical History  Procedure Date  . Mandible fracture surgery   . Knee surgery   . Tonsillectomy      MEDICATIONS:   Previous Medications   ALPRAZOLAM (XANAX PO)    Take by mouth.    ALPRAZOLAM (XANAX) 2 MG TABLET    Take 2 mg by mouth 3 (three) times daily as needed. For anxiety    AMLODIPINE (NORVASC) 10 MG TABLET    Take 10 mg by mouth daily.     ATENOLOL (TENORMIN) 100 MG TABLET    Take 100 mg by mouth daily.     ATENOLOL PO    Take by mouth.     DIVALPROEX (DEPAKOTE) 500 MG EC TABLET    Take 500 mg by mouth 3 (three) times daily.     DIVALPROEX SODIUM (DEPAKOTE PO)    Take by mouth.     DULOXETINE (CYMBALTA) 60 MG CAPSULE    Take 60 mg by mouth daily.     DULOXETINE HCL (CYMBALTA PO)    Take by mouth.     HYDROCHLOROTHIAZIDE 25 MG TABLET    Take 25 mg by mouth daily.     IBUPROFEN (ADVIL,MOTRIN) 200 MG TABLET    Take 400 mg by  mouth every 6 (six) hours as needed. For pain    INSULIN ASPART (NOVOLOG) 100 UNIT/ML INJECTION    Inject 5 Units into the skin daily.    LEVETIRACETAM (KEPPRA) 500 MG TABLET    Take 500 mg by mouth 3 (three) times daily.     LISINOPRIL-HYDROCHLOROTHIAZIDE PO    Take by mouth.     METFORMIN (GLUCOPHAGE) 1000 MG TABLET    Take 1,000 mg by mouth 2 (two) times daily.     METFORMIN HCL PO    Take by mouth.     OXYCODONE-ACETAMINOPHEN (PERCOCET) 5-325 MG PER TABLET    Take 1 tablet by mouth every 4 (four) hours as needed. For pain     ALLERGIES:  Allergies as of 03/08/2011 - Review Complete 03/08/2011  Allergen Reaction Noted  . Aspirin Swelling 12/28/2010     FAMILY HISTORY:  Family History  Problem Relation Age of Onset  . Arthritis    . Diabetes    . Asthma  SOCIAL HISTORY: History   Social History  . Marital Status: Single    Spouse Name: N/A    Number of Children: N/A  . Years of Education: 11th grade   Occupational History  . unemployed    Social History Main Topics  . Smoking status: Current Everyday Smoker -- 1.0 packs/day    Types: Cigarettes  . Smokeless tobacco: None  . Alcohol Use: No  . Drug Use: No  . Sexually Active: Not Currently   Other Topics Concern  . None   Social History Narrative  . None        Review of Systems 10 Systems reviewed and are negative for acute change except as noted in the HPI.  Physical Exam  BP 112/78  Pulse 65  Temp(Src) 98.6 F (37 C) (Oral)  Resp 12  Ht 5\' 7"  (1.702 m)  Wt 210 lb (95.255 kg)  BMI 32.89 kg/m2  SpO2 99%  Physical Exam CONSTITUTIONAL: Well developed/well nourished HEAD AND FACE: Normocephalic/atraumatic EYES: EOMI/PERRL ENMT: Mucous membranes moist NECK: supple no meningeal signs SPINE:entire spine nontender CV: S1/S2 noted, no murmurs/rubs/gallops noted LUNGS: Lungs are clear to auscultation bilaterally, no apparent distress ABDOMEN: soft, nontender, no rebound or guarding GU:no cva  tenderness; rectal tone normal, prostate slightly enlarged but nontender, no saddle anesthesia NEURO: Pt is awake/alert, moves all extremitiesx4, no focal motor deficit in the LE EXTREMITIES: pulses normal, full ROM, no tenderness SKIN: warm, color normal PSYCH: no abnormalities of mood noted  ED Course  Procedures  OTHER DATA REVIEWED: Nursing notes, vital signs, and past medical records reviewed.   MDM: No significant signs of urinary retention (urine volume less than per nursing) No focal motor deficits He is awake/alert, maex4, appropriate.  He feels comfortable for d/c home He has ride home.  He is attempting to get followup as outpatient No seizure here.   IMPRESSION:  Concern about urinary tract disease without diagnosis      PLAN:  Discharged home  CONDITION ON DISCHARGE: stable Agrees with discharge   I personally performed the services described in this documentation, which was scribed in my presence. The recorded information has been reviewed and considered. Joya Gaskins, MD       Joya Gaskins, MD 03/08/11 [phone]

## 2011-03-08 NOTE — ED Provider Notes (Signed)
History   Scribed for Joya Gaskins, MD, the patient was seen in room APA07/APA07 . This chart was scribed by Desma Paganini. This patient's care was started at 9:10 AM     CSN: [id number] Arrival date & time: 03/08/2011  8:43 AM  Chief Complaint  Patient presents with  . Fall   HPI Melvin Taylor is a 45 y.o. male who presents to the Emergency Department, BIB EMS, complaining of fall. Patient reports his legs gave out and he fell while getting out of bed this AM. Denies head injury or LOC. Rates pain as 10/10 in his right "hip area and downward", pain is worse with movement of RLE and with walking. Pain not better with usual home pain meds ibuprofen or percocet. Patient also complains of migraine that is typical for pt, has migraine meds at home. He denies previous injuries but has had recent surgery on his left knee.   PAST MEDICAL HISTORY:  Past Medical History  Diagnosis Date  . Seizures   . Anxiety   . Depression   . Diabetes mellitus   . HTN (hypertension)   . Sleep apnea   . Bipolar 1 disorder   . Insomnia      PAST SURGICAL HISTORY:  Past Surgical History  Procedure Date  . Mandible fracture surgery   . Knee surgery   . Tonsillectomy      MEDICATIONS:  Previous Medications   ALPRAZOLAM (XANAX PO)    Take by mouth.    ALPRAZOLAM (XANAX) 2 MG TABLET    Take 2 mg by mouth 3 (three) times daily as needed. For anxiety    AMLODIPINE (NORVASC) 10 MG TABLET    Take 10 mg by mouth daily.     ATENOLOL (TENORMIN) 100 MG TABLET    Take 100 mg by mouth daily.     ATENOLOL PO    Take by mouth.     DIVALPROEX (DEPAKOTE) 500 MG EC TABLET    Take 500 mg by mouth 3 (three) times daily.     DIVALPROEX SODIUM (DEPAKOTE PO)    Take by mouth.     DULOXETINE (CYMBALTA) 60 MG CAPSULE    Take 60 mg by mouth daily.     DULOXETINE HCL (CYMBALTA PO)    Take by mouth.     HYDROCHLOROTHIAZIDE 25 MG TABLET    Take 25 mg by mouth daily.     IBUPROFEN (ADVIL,MOTRIN) 200 MG TABLET    Take  400 mg by mouth every 6 (six) hours as needed. For pain    INSULIN ASPART (NOVOLOG) 100 UNIT/ML INJECTION    Inject 5 Units into the skin daily.    LEVETIRACETAM (KEPPRA) 500 MG TABLET    Take 500 mg by mouth 3 (three) times daily.     LISINOPRIL-HYDROCHLOROTHIAZIDE PO    Take by mouth.     METFORMIN (GLUCOPHAGE) 1000 MG TABLET    Take 1,000 mg by mouth 2 (two) times daily.     METFORMIN HCL PO    Take by mouth.     OXYCODONE-ACETAMINOPHEN (PERCOCET) 5-325 MG PER TABLET    Take 1 tablet by mouth every 4 (four) hours as needed. For pain     ALLERGIES:  Allergies as of 03/08/2011 - Review Complete 03/08/2011  Allergen Reaction Noted  . Aspirin Swelling 12/28/2010     FAMILY HISTORY:  Family History  Problem Relation Age of Onset  . Arthritis    . Diabetes    . Asthma  SOCIAL HISTORY: History   Social History  . Marital Status: Single    Spouse Name: N/A    Number of Children: N/A  . Years of Education: 11th grade   Occupational History  . unemployed    Social History Main Topics  . Smoking status: Current Everyday Smoker -- 1.0 packs/day    Types: Cigarettes  . Smokeless tobacco: None  . Alcohol Use: No  . Drug Use: No  . Sexually Active: None   Other Topics Concern  . None   Social History Narrative  . None     Review of Systems  10 Systems reviewed and are negative for acute change except as noted in the HPI.  Physical Exam  BP 137/100  Pulse 79  Temp(Src) 98.8 F (37.1 C) (Oral)  Resp 18  SpO2 95%  Physical Exam  CONSTITUTIONAL: Well developed/well nourished HEAD AND FACE: Normocephalic/atraumatic EYES: EOMI/PERRL ENMT: Mucous membranes moist NECK: supple no meningeal signs SPINE:entire spine nontender, no bruising CV: S1/S2 noted, no murmurs/rubs/gallops noted LUNGS: Lungs are clear to auscultation bilaterally, no apparent distress ABDOMEN: soft, nontender, no rebound or guarding GU:no cva tenderness NEURO: Pt is awake/alert, moves all  extremitiesx4; normal strength and sensation in LE.  No focal motor deficits noted in the LE EXTREMITIES: pulses normal, full ROM, subjective pain to right hip but moves RLE well without pain when distracted SKIN: warm, color normal PSYCH: no abnormalities of mood noted  ED Course  Procedures  OTHER DATA REVIEWED: N  ED COURSE / COORDINATION OF CARE: Pain improved with percocet, pt agrees with discharge home. Has pain meds at home.  MDM: Pt seen in conjunction with medical student.  Patient had reported recent blood in stool, med student did rectal and negative hemoccult Pt well appearing, no signs of bony injury to his LE.  His left knee is healing well after recent surgery.  No signs of infection.  No neurologic compromise noted   IMPRESSION Fall from other slipping, tripping, or stumbling      PLAN:  Discharge The patient is to return the emergency department if there is any worsening of symptoms. I have reviewed the discharge instructions with the patient.   CONDITION ON DISCHARGE: stable   MEDICATIONS GIVEN IN THE E.D. percocet   DISCHARGE MEDICATIONS: New Prescriptions   No medications on file       I personally performed the services described in this documentation, which was scribed in my presence. The recorded information has been reviewed and considered. Joya Gaskins, MD     Joya Gaskins, MD 03/08/11 1046

## 2011-03-08 NOTE — ED Notes (Signed)
Patient ambulated in hall per doctor's request.  Walked with cane  - patient talked and smiled while walking.

## 2011-03-08 NOTE — ED Notes (Signed)
Ambulation trial:  Patient ambulated in hallway with steady gait.  Cane use as assistive device.  No distress on ambulation.  Returned to bed, side rails up X2

## 2011-03-08 NOTE — ED Notes (Signed)
Patient with no complaints at this time. Respirations even and unlabored. Skin warm/dry. Discharge instructions reviewed with patient at this time. Patient given opportunity to voice concerns/ask questions. IV removed per policy and band-aid applied to site. Patient discharged at this time and left Emergency Department with steady gait.  

## 2011-03-08 NOTE — ED Notes (Signed)
Pt brought here by EMS this am post fall. Reports he was given Percocet and now is unable to urinate. Itching on his L knee. No SOB. Pt is lethargic, dozing off.

## 2011-03-08 NOTE — Telephone Encounter (Signed)
Patient called following having been treated in Emergency Room at [facility].  He said his right side of right leg went numb and he was unable to walk.  Said the Er doctor mentioned "some possible nerve damage". States he was given"a pill and was sent on his way."   He mentioned that ibuprofen is not helping his pain; states previously was on Vicodin, prescribed by Dr. Terrilee Croak at [facility] in [place].    Patient mentioned having been to the ER a few times after his last visit here in June 2012.  Last office note here indicates results of MRI reviewed with patient; next appointment scheduled here 03/17/11 for re-eval of knee.  Also noted is that patient is followed at [facility] as well.  I advised about his [place] Medicaid requiring a referral for any appointments with specialist, and that if it is numbness and nerve-related, that Dr. Romeo Apple does not evaluate here.  Patient aware of fol/up here for knee in August.  He also said that his Primary care doctor is leaving and that he is working with Social Services to have his insurance card changed to Dr. Felecia Shelling as his doctor.  He will either contact primary care or re-check with Emergency Room.

## 2011-03-08 NOTE — ED Notes (Signed)
Patient arrives via EMS from home with c/o fall. Patient reports left hip and knee "gave way". Denies hitting head. Denies LOC. Patient alert/ oriented. Rates pain 10/10 on NPS. Requesting pain medication. Patient reporting left hip and knee pain. No shortening or rotation noted to left leg.

## 2011-03-09 ENCOUNTER — Encounter (HOSPITAL_COMMUNITY): Payer: Self-pay

## 2011-03-11 NOTE — Telephone Encounter (Signed)
Signed off by nurse. Done.

## 2011-03-16 ENCOUNTER — Emergency Department (HOSPITAL_COMMUNITY)
Admission: EM | Admit: 2011-03-16 | Discharge: 2011-03-16 | Disposition: A | Discharge status: Home / Self Care | Payer: Medicaid Other | Source: Home / Self Care | Attending: Emergency Medicine | Admitting: Emergency Medicine

## 2011-03-16 ENCOUNTER — Emergency Department (HOSPITAL_COMMUNITY): Payer: Medicaid Other

## 2011-03-16 ENCOUNTER — Encounter (HOSPITAL_COMMUNITY): Payer: Self-pay

## 2011-03-16 ENCOUNTER — Emergency Department (HOSPITAL_COMMUNITY)
Admission: EM | Admit: 2011-03-16 | Discharge: 2011-03-16 | Disposition: A | Discharge status: Home / Self Care | Payer: Medicaid Other | Attending: Emergency Medicine | Admitting: Emergency Medicine

## 2011-03-16 DIAGNOSIS — E119 Type 2 diabetes mellitus without complications: Secondary | ICD-10-CM | POA: Insufficient documentation

## 2011-03-16 DIAGNOSIS — Z79899 Other long term (current) drug therapy: Secondary | ICD-10-CM | POA: Insufficient documentation

## 2011-03-16 DIAGNOSIS — M25569 Pain in unspecified knee: Secondary | ICD-10-CM | POA: Insufficient documentation

## 2011-03-16 DIAGNOSIS — G4730 Sleep apnea, unspecified: Secondary | ICD-10-CM | POA: Insufficient documentation

## 2011-03-16 DIAGNOSIS — R569 Unspecified convulsions: Secondary | ICD-10-CM | POA: Insufficient documentation

## 2011-03-16 DIAGNOSIS — F341 Dysthymic disorder: Secondary | ICD-10-CM | POA: Insufficient documentation

## 2011-03-16 DIAGNOSIS — M542 Cervicalgia: Secondary | ICD-10-CM | POA: Insufficient documentation

## 2011-03-16 DIAGNOSIS — F319 Bipolar disorder, unspecified: Secondary | ICD-10-CM | POA: Insufficient documentation

## 2011-03-16 DIAGNOSIS — I10 Essential (primary) hypertension: Secondary | ICD-10-CM | POA: Insufficient documentation

## 2011-03-16 DIAGNOSIS — G40909 Epilepsy, unspecified, not intractable, without status epilepticus: Secondary | ICD-10-CM | POA: Insufficient documentation

## 2011-03-16 LAB — URINALYSIS, ROUTINE W REFLEX MICROSCOPIC
Bilirubin Urine: NEGATIVE
Hgb urine dipstick: NEGATIVE
Protein, ur: NEGATIVE mg/dL
Specific Gravity, Urine: 1.02 (ref 1.005–1.030)
Urobilinogen, UA: 0.2 mg/dL (ref 0.0–1.0)

## 2011-03-16 LAB — COMPREHENSIVE METABOLIC PANEL
AST: 15 U/L (ref 0–37)
Albumin: 3.9 g/dL (ref 3.5–5.2)
Alkaline Phosphatase: 50 U/L (ref 39–117)
BUN: 9 mg/dL (ref 6–23)
Chloride: 101 mEq/L (ref 96–112)
Potassium: 4.5 mEq/L (ref 3.5–5.1)
Total Bilirubin: 0.3 mg/dL (ref 0.3–1.2)

## 2011-03-16 LAB — CBC
HCT: 44.2 % (ref 39.0–52.0)
MCH: 30.4 pg (ref 26.0–34.0)
MCHC: 34.6 g/dL (ref 30.0–36.0)
MCV: 87.7 fL (ref 78.0–100.0)
RDW: 13.1 % (ref 11.5–15.5)

## 2011-03-16 LAB — BASIC METABOLIC PANEL
BUN: 9 mg/dL (ref 6–23)
Calcium: 10.3 mg/dL (ref 8.4–10.5)
Creatinine, Ser: 0.92 mg/dL (ref 0.50–1.35)
GFR calc Af Amer: >60 mL/min (ref >60)
GFR calc non Af Amer: >60 mL/min (ref >60)

## 2011-03-16 LAB — GLUCOSE, CAPILLARY

## 2011-03-16 LAB — VALPROIC ACID LEVEL: Valproic Acid Lvl: 77.6 ug/mL (ref 50.0–100.0)

## 2011-03-16 MED ORDER — HYDROCODONE-ACETAMINOPHEN 5-325 MG PO TABS
2.0000 | ORAL_TABLET | Freq: Once | ORAL | Status: AC
  Administered 2011-03-16: 2 via ORAL
  Filled 2011-03-16: qty 2

## 2011-03-16 MED ORDER — LORAZEPAM 2 MG/ML IJ SOLN
INTRAMUSCULAR | Status: AC
  Administered 2011-03-16: 1 mg via INTRAVENOUS
  Filled 2011-03-16: qty 1

## 2011-03-16 MED ORDER — LORAZEPAM 2 MG/ML IJ SOLN
1.0000 mg | Freq: Once | INTRAMUSCULAR | Status: AC
  Administered 2011-03-16: 1 mg via INTRAVENOUS

## 2011-03-16 MED ORDER — SODIUM CHLORIDE 0.9 % IV SOLN
Freq: Once | INTRAVENOUS | Status: AC
  Administered 2011-03-16: 10:00:00 via INTRAVENOUS

## 2011-03-16 NOTE — Progress Notes (Signed)
No seizures since being in ED. CBG ordered.

## 2011-03-16 NOTE — ED Provider Notes (Signed)
History     CSN: [id number] Arrival date & time: 03/16/2011  9:34 AM  Chief Complaint  Patient presents with  . Seizures   HPI Comments: Pt states he has had seizures since age 45. This AM is woke up to being on the floor. He feels he had a seizure during the night or early AM. He c/o headache, weakness, and left knee pain. Pt had knee surgery 3 weeks ago. Pt reports being on depakote, but states the dose has been reduced by his MD.  Patient is a 45 y.o. male presenting with seizures. The history is provided by the patient.  Seizures  This is a recurrent problem. The current episode started 1 to 2 hours ago. The problem has been gradually improving. There was 1 seizure. Associated symptoms include sleepiness, headaches and neck stiffness. Pertinent negatives include no confusion, no speech difficulty, no chest pain and no cough. Characteristics include eye deviation, bladder incontinence and rhythmic jerking. The episode was not witnessed. Possible causes include med or dosage change. Possible causes do not include change in alcohol use. There has been no fever. There were no medications administered prior to arrival.    Past Medical History  Diagnosis Date  . Seizures   . Anxiety   . Depression   . Diabetes mellitus   . HTN (hypertension)   . Sleep apnea   . Bipolar 1 disorder   . Insomnia     Past Surgical History  Procedure Date  . Mandible fracture surgery   . Knee surgery   . Tonsillectomy     Family History  Problem Relation Age of Onset  . Arthritis    . Diabetes    . Asthma      History  Substance Use Topics  . Smoking status: Current Everyday Smoker -- 1.0 packs/day    Types: Cigarettes  . Smokeless tobacco: Not on file  . Alcohol Use: No      Review of Systems  Constitutional: Negative for activity change.       All ROS Neg except as noted in HPI  HENT: Negative for nosebleeds and neck pain.   Eyes: Negative for photophobia and discharge.    Respiratory: Negative for cough, shortness of breath and wheezing.   Cardiovascular: Negative for chest pain and palpitations.  Gastrointestinal: Negative for abdominal pain and blood in stool.  Genitourinary: Positive for bladder incontinence. Negative for dysuria, frequency and hematuria.  Musculoskeletal: Negative for back pain and arthralgias.  Skin: Negative.   Neurological: Positive for seizures and headaches. Negative for dizziness and speech difficulty.  Psychiatric/Behavioral: Negative for hallucinations and confusion.    Physical Exam  BP 152/96  Pulse 63  Temp(Src) 99.2 F (37.3 C) (Oral)  Resp 18  Ht 5\' 7"  (1.702 m)  Wt 210 lb (95.255 kg)  BMI 32.89 kg/m2  SpO2 98%  Physical Exam  Nursing note and vitals reviewed. Constitutional: He is oriented to person, place, and time. He appears well-developed and well-nourished.  Non-toxic appearance.  HENT:  Head: Normocephalic.  Right Ear: Tympanic membrane and external ear normal.  Left Ear: Tympanic membrane and external ear normal.       Posterior scalp sore.  Eyes: EOM and lids are normal. Pupils are equal, round, and reactive to light.  Neck: Normal range of motion. Neck supple. Carotid bruit is not present.  Cardiovascular: Normal rate, regular rhythm, normal heart sounds, intact distal pulses and normal pulses.   Pulmonary/Chest: Breath sounds normal. No respiratory distress.  Abdominal: Soft. Bowel sounds are normal. There is no tenderness. There is no guarding.  Musculoskeletal: Normal range of motion.       Left knee pain with attempted ROM. No gross deformity.  Lymphadenopathy:       Head (right side): No submandibular adenopathy present.       Head (left side): No submandibular adenopathy present.    He has no cervical adenopathy.  Neurological: He is alert and oriented to person, place, and time. He has normal strength. No cranial nerve deficit or sensory deficit.  Skin: Skin is warm and dry.  Psychiatric:  He has a normal mood and affect. His speech is normal.    ED Course  Procedures  MDM I have reviewed nursing notes, vital signs, and all appropriate lab and imaging results for this patient.      Kathie Dike, [place] 03/16/11 1051

## 2011-03-16 NOTE — ED Notes (Signed)
Pt says woke up in the floor this morning around 0700.  Reports has history of seizures and thinks had seizure this morning.  Pt lives alone.  C/O severe headache, weakness, and left knee pain.   Says had left knee surgery 3 weeks ago and thinks hit it this am during the seizure.

## 2011-03-17 ENCOUNTER — Telehealth: Payer: Self-pay | Admitting: Orthopedic Surgery

## 2011-03-17 ENCOUNTER — Ambulatory Visit: Payer: Medicaid Other | Admitting: Orthopedic Surgery

## 2011-03-17 NOTE — Telephone Encounter (Signed)
Patient's appointment today had been cancelled due to patient coming without the requested information from previous MRI ordered by another orthopedic specialist, Dr. Rinaldo Ratel.  Patient states he also has history of surgery by Dr. Chaney Malling.  He was advised to obtain the requested medical records and films for Dr. Romeo Apple to review prior to re-scheduling this appointment.

## 2011-04-07 NOTE — ED Provider Notes (Signed)
Medical screening examination/treatment/procedure(s) were performed by non-physician practitioner and as supervising physician I was immediately available for consultation/collaboration.   Shelda Jakes, MD 04/07/11 [phone]

## 2011-04-09 ENCOUNTER — Emergency Department (HOSPITAL_COMMUNITY)
Admission: EM | Admit: 2011-04-09 | Discharge: 2011-04-09 | Disposition: A | Discharge status: Home / Self Care | Payer: Medicaid Other | Attending: Emergency Medicine | Admitting: Emergency Medicine

## 2011-04-09 ENCOUNTER — Encounter (HOSPITAL_COMMUNITY): Payer: Self-pay

## 2011-04-09 DIAGNOSIS — I10 Essential (primary) hypertension: Secondary | ICD-10-CM | POA: Insufficient documentation

## 2011-04-09 DIAGNOSIS — E119 Type 2 diabetes mellitus without complications: Secondary | ICD-10-CM | POA: Insufficient documentation

## 2011-04-09 DIAGNOSIS — G4700 Insomnia, unspecified: Secondary | ICD-10-CM | POA: Insufficient documentation

## 2011-04-09 DIAGNOSIS — F515 Nightmare disorder: Secondary | ICD-10-CM

## 2011-04-09 NOTE — ED Notes (Signed)
Pt given meal tray, tolerating well.
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
## 2011-04-23 NOTE — Progress Notes (Signed)
  Medical screening examination/treatment/procedure(s) were performed by non-physician practitioner and as supervising physician I was immediately available for consultation/collaboration.     

## 2011-04-26 ENCOUNTER — Emergency Department (HOSPITAL_COMMUNITY): Payer: Medicaid Other

## 2011-04-26 ENCOUNTER — Emergency Department (HOSPITAL_COMMUNITY)
Admission: EM | Admit: 2011-04-26 | Discharge: 2011-04-26 | Disposition: A | Discharge status: Home / Self Care | Payer: Medicaid Other | Attending: Emergency Medicine | Admitting: Emergency Medicine

## 2011-04-26 ENCOUNTER — Encounter (HOSPITAL_COMMUNITY): Payer: Self-pay

## 2011-04-26 DIAGNOSIS — F3289 Other specified depressive episodes: Secondary | ICD-10-CM | POA: Insufficient documentation

## 2011-04-26 DIAGNOSIS — F329 Major depressive disorder, single episode, unspecified: Secondary | ICD-10-CM | POA: Insufficient documentation

## 2011-04-26 DIAGNOSIS — R569 Unspecified convulsions: Secondary | ICD-10-CM | POA: Insufficient documentation

## 2011-04-26 DIAGNOSIS — E119 Type 2 diabetes mellitus without complications: Secondary | ICD-10-CM | POA: Insufficient documentation

## 2011-04-26 DIAGNOSIS — IMO0002 Reserved for concepts with insufficient information to code with codable children: Secondary | ICD-10-CM | POA: Insufficient documentation

## 2011-04-26 DIAGNOSIS — Z794 Long term (current) use of insulin: Secondary | ICD-10-CM | POA: Insufficient documentation

## 2011-04-26 DIAGNOSIS — F17200 Nicotine dependence, unspecified, uncomplicated: Secondary | ICD-10-CM | POA: Insufficient documentation

## 2011-04-26 DIAGNOSIS — I10 Essential (primary) hypertension: Secondary | ICD-10-CM | POA: Insufficient documentation

## 2011-04-26 DIAGNOSIS — R51 Headache: Secondary | ICD-10-CM | POA: Insufficient documentation

## 2011-04-26 DIAGNOSIS — F411 Generalized anxiety disorder: Secondary | ICD-10-CM | POA: Insufficient documentation

## 2011-04-26 DIAGNOSIS — Z79899 Other long term (current) drug therapy: Secondary | ICD-10-CM | POA: Insufficient documentation

## 2011-04-26 LAB — DIFFERENTIAL
Basophils Relative: 0 % (ref 0–1)
Eosinophils Absolute: 0.2 10*3/uL (ref 0.0–0.7)
Eosinophils Relative: 2 % (ref 0–5)
Monocytes Absolute: 0.6 10*3/uL (ref 0.1–1.0)
Monocytes Relative: 9 % (ref 3–12)

## 2011-04-26 LAB — COMPREHENSIVE METABOLIC PANEL
Albumin: 3.8 g/dL (ref 3.5–5.2)
BUN: 11 mg/dL (ref 6–23)
Creatinine, Ser: 0.99 mg/dL (ref 0.50–1.35)
GFR calc Af Amer: >60 mL/min (ref >60)
Total Protein: 6.7 g/dL (ref 6.0–8.3)

## 2011-04-26 LAB — ETHANOL: Alcohol, Ethyl (B): <11 mg/dL (ref 0–11)

## 2011-04-26 LAB — CBC
HCT: 42.3 % (ref 39.0–52.0)
Hemoglobin: 14.8 g/dL (ref 13.0–17.0)
MCH: 30.9 pg (ref 26.0–34.0)
MCHC: 35 g/dL (ref 30.0–36.0)
MCV: 88.3 fL (ref 78.0–100.0)

## 2011-04-26 LAB — VALPROIC ACID LEVEL: Valproic Acid Lvl: 101.2 ug/mL — ABNORMAL HIGH (ref 50.0–100.0)

## 2011-04-26 MED ORDER — ACETAMINOPHEN 500 MG PO TABS
1000.0000 mg | ORAL_TABLET | Freq: Once | ORAL | Status: AC
  Administered 2011-04-26: 1000 mg via ORAL
  Filled 2011-04-26: qty 2

## 2011-04-26 MED ORDER — DIVALPROEX SODIUM 125 MG PO DR TAB
125.0000 mg | DELAYED_RELEASE_TABLET | Freq: Once | ORAL | Status: DC
  Filled 2011-04-26: qty 1

## 2011-04-26 MED ORDER — SODIUM CHLORIDE 0.9 % IV SOLN: Freq: Once | INTRAVENOUS | Status: DC

## 2011-04-26 NOTE — ED Notes (Signed)
Received report from [place], Char[employer] fundraiser.  Patient given sandwich and soda.

## 2011-04-26 NOTE — ED Notes (Signed)
Patient discharged home in good condition.  IV d/c'd; catheter intact and site WNL.  Patient to be transported to home by his [facility] surgeon.
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
## 2011-04-26 NOTE — ED Notes (Signed)
Pt not happy with tylenol for pain. req food to eat now

## 2011-04-26 NOTE — ED Notes (Signed)
Pt states his head still hurts. req something to drink and pain med. edp aware

## 2011-04-26 NOTE — ED Provider Notes (Signed)
History    Scribed for Benny Lennert, MD, the patient was seen in room APA19/APA19. This chart was scribed by Katha Cabal. This patient's care was started at 3:29 PM.      CSN: [id number] Arrival date & time: 04/26/2011  2:38 PM  Chief Complaint  Patient presents with  . Seizures    HPI  (Consider location/radiation/quality/duration/timing/severity/associated sxs/prior treatment)  Patient is a 45 y.o. male presenting with seizures. The history is provided by the patient. No language interpreter was used.  Seizures  This is a recurrent problem. The problem has been gradually improving. Associated symptoms include sleepiness, headaches, speech difficulty and muscle weakness. Pertinent negatives include no neck stiffness, no chest pain, no cough and no diarrhea. Characteristics include rhythmic jerking and loss of consciousness. Characteristics do not include bit tongue. The episode was not witnessed. Possible causes do not include missed seizure meds. There has been no fever.    Patient loss consciousness and hit head on the floor.  Patient lives alone and when he came to he had his neighbor call EMS.  Pt c/o constant persistent occipital headache and bilateral lower extremity weakness (baseline after seizure).  Denies tongue biting, fever, neck pain, and abdominal pain.  Patient takes Keppra 500 mg and Depakote 500 mg three times a day. States his last seizure was two weeks ago and has them "when he gets upset".   PCP Odette Horns, MD, MD    PAST MEDICAL HISTORY:  Past Medical History  Diagnosis Date  . Seizures   . Anxiety   . Depression   . Diabetes mellitus   . HTN (hypertension)   . Sleep apnea   . Bipolar 1 disorder   . Insomnia     PAST SURGICAL HISTORY:  Past Surgical History  Procedure Date  . Mandible fracture surgery   . Knee surgery   . Tonsillectomy     FAMILY HISTORY:  Family History  Problem Relation Age of Onset  . Arthritis    . Diabetes    .  Asthma       SOCIAL HISTORY: History   Social History  . Marital Status: Single    Spouse Name: N/A    Number of Children: N/A  . Years of Education: 11th grade   Occupational History  . unemployed    Social History Main Topics  . Smoking status: Current Everyday Smoker -- 1.0 packs/day    Types: Cigarettes  . Smokeless tobacco: None  . Alcohol Use: No  . Drug Use: No  . Sexually Active: None   Other Topics Concern  . None   Social History Narrative  . None      Review of Systems  Review of Systems  Constitutional: Negative for fatigue.  HENT: Negative for congestion, sinus pressure and ear discharge.   Eyes: Negative for discharge.  Respiratory: Negative for cough.   Cardiovascular: Negative for chest pain.  Gastrointestinal: Negative for abdominal pain and diarrhea.  Genitourinary: Negative for frequency and hematuria.  Musculoskeletal: Negative for back pain.  Skin: Negative for rash.  Neurological: Positive for seizures, loss of consciousness, speech difficulty and headaches.  Hematological: Negative.   Psychiatric/Behavioral: Negative for hallucinations.    Allergies  Aspirin  Home Medications   Current Outpatient Rx  Name Route Sig Dispense Refill  . ALPRAZOLAM 2 MG PO TABS Oral Take 2 mg by mouth 3 (three) times daily as needed. For anxiety    . AMLODIPINE BESYLATE 10 MG PO TABS Oral Take  10 mg by mouth daily.      . ATENOLOL 100 MG PO TABS Oral Take 100 mg by mouth daily.      Marland Kitchen DIVALPROEX SODIUM 500 MG PO TBEC Oral Take 500 mg by mouth 3 (three) times daily.      . DULOXETINE HCL 60 MG PO CPEP Oral Take 60 mg by mouth at bedtime.     Marland Kitchen HYDROCHLOROTHIAZIDE 25 MG PO TABS Oral Take 25 mg by mouth daily.      . INSULIN GLARGINE 100 UNIT/ML [place] SOLN Subcutaneous Inject 50 Units into the skin at bedtime.     Marland Kitchen LEVETIRACETAM 500 MG PO TABS Oral Take 500 mg by mouth 3 (three) times daily.      Marland Kitchen LISINOPRIL-HYDROCHLOROTHIAZIDE 20-12.5 MG PO TABS Oral Take  1 tablet by mouth daily.      Marland Kitchen METFORMIN HCL 1000 MG PO TABS Oral Take 1,000 mg by mouth 2 (two) times daily.      Marland Kitchen XANAX PO Oral Take by mouth.     . ATENOLOL PO Oral Take by mouth.      . DEPAKOTE PO Oral Take by mouth.      . CYMBALTA PO Oral Take by mouth.      . IBUPROFEN 200 MG PO TABS Oral Take 400 mg by mouth every 6 (six) hours as needed. For pain     . IBUPROFEN 800 MG PO TABS Oral Take 800 mg by mouth 2 (two) times daily as needed. FOR PAIN     . INSULIN ASPART 100 UNIT/ML  SOLN Subcutaneous Inject 5 Units into the skin daily.     Marland Kitchen LISINOPRIL-HYDROCHLOROTHIAZIDE PO Oral Take by mouth.      . METFORMIN HCL PO Oral Take by mouth.      . OXYCODONE-ACETAMINOPHEN 5-325 MG PO TABS Oral Take 1 tablet by mouth every 4 (four) hours as needed. For pain      Physical Exam    BP 131/78  Pulse 96  Temp(Src) 97.8 F (36.6 C) (Oral)  Resp 20  Ht 5\' 7"  (1.702 m)  Wt 206 lb (93.441 kg)  BMI 32.26 kg/m2  SpO2 95%  Physical Exam  Constitutional: He is oriented to person, place, and time. He appears well-developed. He appears lethargic.  HENT:  Head: Normocephalic.       No evidence of oral trauma.  Occipital tenderness   Eyes: Conjunctivae and EOM are normal. No scleral icterus.  Neck: Neck supple. No thyromegaly present.  Cardiovascular: Normal rate and regular rhythm.  Exam reveals no gallop and no friction rub.   No murmur heard. Pulmonary/Chest: Effort normal. No stridor. He has no wheezes. He has no rales. He exhibits no tenderness.  Abdominal: Soft. He exhibits no distension. There is no tenderness. There is no rebound.  Musculoskeletal: Normal range of motion. He exhibits no edema.  Lymphadenopathy:    He has no cervical adenopathy.  Neurological: He is oriented to person, place, and time. He appears lethargic.       Bilateral lower extremity weakness (per patient baseline after seizure)   Skin: No rash noted. No erythema.  Psychiatric: He has a normal mood and affect.  His behavior is normal. His speech is slurred.    ED Course  Procedures (including critical care time) OTHER DATA REVIEWED: Nursing notes, vital signs, and past medical records reviewed.   DIAGNOSTIC STUDIES: Oxygen Saturation is 95% on room air, normal by my interpretation.      LABS /  RADIOLOGY:  Results for orders placed during the hospital encounter of 04/26/11  CBC      Component Value Range   WBC 7.0  4.0 - 10.5 (K/uL)   RBC 4.79  4.22 - 5.81 (MIL/uL)   Hemoglobin 14.8  13.0 - 17.0 (g/dL)   HCT 16.1  09.6 - 04.5 (%)   MCV 88.3  78.0 - 100.0 (fL)   MCH 30.9  26.0 - 34.0 (pg)   MCHC 35.0  30.0 - 36.0 (g/dL)   RDW 40.9  81.1 - 91.4 (%)   Platelets 185  150 - 400 (K/uL)  DIFFERENTIAL      Component Value Range   Neutrophils Relative 30 (*) 43 - 77 (%)   Neutro Abs 2.1  1.7 - 7.7 (K/uL)   Lymphocytes Relative 58 (*) 12 - 46 (%)   Lymphs Abs 4.1 (*) 0.7 - 4.0 (K/uL)   Monocytes Relative 9  3 - 12 (%)   Monocytes Absolute 0.6  0.1 - 1.0 (K/uL)   Eosinophils Relative 2  0 - 5 (%)   Eosinophils Absolute 0.2  0.0 - 0.7 (K/uL)   Basophils Relative 0  0 - 1 (%)   Basophils Absolute 0.0  0.0 - 0.1 (K/uL)  COMPREHENSIVE METABOLIC PANEL      Component Value Range   Sodium 139  135 - 145 (mEq/L)   Potassium 3.7  3.5 - 5.1 (mEq/L)   Chloride 100  96 - 112 (mEq/L)   CO2 28  19 - 32 (mEq/L)   Glucose, Bld 109 (*) 70 - 99 (mg/dL)   BUN 11  6 - 23 (mg/dL)   Creatinine, Ser 7.82  0.50 - 1.35 (mg/dL)   Calcium 9.7  8.4 - 95.6 (mg/dL)   Total Protein 6.7  6.0 - 8.3 (g/dL)   Albumin 3.8  3.5 - 5.2 (g/dL)   AST 13  0 - 37 (U/L)   ALT 13  0 - 53 (U/L)   Alkaline Phosphatase 43  39 - 117 (U/L)   Total Bilirubin 0.2 (*) 0.3 - 1.2 (mg/dL)   GFR calc non Af Amer >60  >60 (mL/min)   GFR calc Af Amer >60  >60 (mL/min)  ETHANOL      Component Value Range   Alcohol, Ethyl (B) <11  0 - 11 (mg/dL)  VALPROIC ACID LEVEL      Component Value Range   Valproic Acid Lvl 101.2 (*) 50.0 -  100.0 (ug/mL)     Ct Head Wo Contrast  04/26/2011  *RADIOLOGY REPORT*  Clinical Data:  Seizures.  Hit posterior head.  CT HEAD WITHOUT CONTRAST CT CERVICAL SPINE WITHOUT CONTRAST  Technique:  Multidetector CT imaging of the head and cervical spine was performed following the standard protocol without intravenous contrast.  Multiplanar CT image reconstructions of the cervical spine were also generated.  Comparison:  CT head and cervical spine 03/16/2011  CT HEAD  Findings: The ventricles are normal in size.  Gray-white differentiation is normal.  Negative for hemorrhage, hydrocephalus, mass effect, mass lesion, or evidence of acute infarction. The visualized paranasal sinuses, mastoid air cells, and middle ears are clear.  The skull is intact.  The soft tissues of the scalp and orbits are symmetric.  IMPRESSION: No acute intracranial abnormality.  CT CERVICAL SPINE  Findings: Cervical spine vertebral bodies are normally aligned from the skull base through the T2 vertebral body.  There is reversal of the normal cervical lordosis.  There is slight disc space narrowing and osteophyte  formation at C5-6 and there is anterior osteophyte formation along the anterior inferior aspect of the C6 vertebral body.  No acute cervical spine fracture is identified.  The spinal canal is patent.  No evidence of hematoma.  Thyroid gland is unremarkable.  The visualized lung apices are aerated.  IMPRESSION:  1.  No evidence of acute bony injury to the cervical spine. 2.  Reversal of the normal cervical lordosis can be secondary to patient positioning, muscle spasm, or ligamentous injury. 3.  Stable mild degenerative disc disease at C5-6.  Original Report Authenticated By: Britta Mccreedy, M.D.   Ct Cervical Spine Wo Contrast  04/26/2011  *RADIOLOGY REPORT*  Clinical Data:  Seizures.  Hit posterior head.  CT HEAD WITHOUT CONTRAST CT CERVICAL SPINE WITHOUT CONTRAST  Technique:  Multidetector CT imaging of the head and cervical spine  was performed following the standard protocol without intravenous contrast.  Multiplanar CT image reconstructions of the cervical spine were also generated.  Comparison:  CT head and cervical spine 03/16/2011  CT HEAD  Findings: The ventricles are normal in size.  Gray-white differentiation is normal.  Negative for hemorrhage, hydrocephalus, mass effect, mass lesion, or evidence of acute infarction. The visualized paranasal sinuses, mastoid air cells, and middle ears are clear.  The skull is intact.  The soft tissues of the scalp and orbits are symmetric.  IMPRESSION: No acute intracranial abnormality.  CT CERVICAL SPINE  Findings: Cervical spine vertebral bodies are normally aligned from the skull base through the T2 vertebral body.  There is reversal of the normal cervical lordosis.  There is slight disc space narrowing and osteophyte formation at C5-6 and there is anterior osteophyte formation along the anterior inferior aspect of the C6 vertebral body.  No acute cervical spine fracture is identified.  The spinal canal is patent.  No evidence of hematoma.  Thyroid gland is unremarkable.  The visualized lung apices are aerated.  IMPRESSION:  1.  No evidence of acute bony injury to the cervical spine. 2.  Reversal of the normal cervical lordosis can be secondary to patient positioning, muscle spasm, or ligamentous injury. 3.  Stable mild degenerative disc disease at C5-6.  Original Report Authenticated By: Britta Mccreedy, M.D.       ED COURSE / COORDINATION OF CARE: 3:41 PM  Physical exam complete.  Patient was sleeping upon entering the room.  Will order pain control and labs.  Continue to observe patient.     Orders Placed This Encounter  Procedures  . CT Head Wo Contrast  . CT Cervical Spine Wo Contrast  . CBC  . Differential  . Comprehensive metabolic panel  . Ethanol  . Valproic acid level    MDM: seizure   IMPRESSION: Diagnoses that have been ruled out:  Diagnoses that are still under  consideration:  Final diagnoses:     MEDICATIONS GIVEN IN THE E.D. Scheduled Meds:    . sodium chloride   Intravenous Once  . methylPREDNISolone acetate  40 mg Intra-articular Once  . DISCONTD: divalproex  125 mg Oral Once   Continuous Infusions:     DISCHARGE MEDICATIONS: New Prescriptions   No medications on file    The chart was scribed for me under my direct supervision.  I personally performed the history, physical, and medical decision making and all procedures in the evaluation of this patient.Benny Lennert, MD 04/26/11 [phone]

## 2011-04-28 LAB — DIFFERENTIAL
Eosinophils Absolute: 0.2
Eosinophils Relative: 3
Lymphocytes Relative: 51 — ABNORMAL HIGH
Lymphs Abs: 3.1
Monocytes Absolute: 0.5
Monocytes Relative: 9

## 2011-04-28 LAB — CBC
HCT: 36.7 — ABNORMAL LOW
Hemoglobin: 12.8 — ABNORMAL LOW
MCV: 89.5
RBC: 4.1 — ABNORMAL LOW
WBC: 6

## 2011-04-28 LAB — LEVETIRACETAM LEVEL: Levetiracetam Lvl: 16.8 ug/mL

## 2011-04-28 LAB — BASIC METABOLIC PANEL
Chloride: 110
GFR calc non Af Amer: >60
Potassium: 3.4 — ABNORMAL LOW
Sodium: 143

## 2011-04-29 ENCOUNTER — Emergency Department (HOSPITAL_COMMUNITY)
Admission: EM | Admit: 2011-04-29 | Discharge: 2011-04-29 | Disposition: A | Discharge status: Home / Self Care | Payer: Medicaid Other | Attending: Emergency Medicine | Admitting: Emergency Medicine

## 2011-04-29 ENCOUNTER — Other Ambulatory Visit: Payer: Self-pay

## 2011-04-29 ENCOUNTER — Encounter (HOSPITAL_COMMUNITY): Payer: Self-pay | Admitting: *Deleted

## 2011-04-29 DIAGNOSIS — E119 Type 2 diabetes mellitus without complications: Secondary | ICD-10-CM | POA: Insufficient documentation

## 2011-04-29 DIAGNOSIS — F319 Bipolar disorder, unspecified: Secondary | ICD-10-CM | POA: Insufficient documentation

## 2011-04-29 DIAGNOSIS — Z794 Long term (current) use of insulin: Secondary | ICD-10-CM | POA: Insufficient documentation

## 2011-04-29 DIAGNOSIS — I10 Essential (primary) hypertension: Secondary | ICD-10-CM | POA: Insufficient documentation

## 2011-04-29 DIAGNOSIS — R569 Unspecified convulsions: Secondary | ICD-10-CM

## 2011-04-29 DIAGNOSIS — G40909 Epilepsy, unspecified, not intractable, without status epilepticus: Secondary | ICD-10-CM

## 2011-04-29 DIAGNOSIS — I4460 Unspecified fascicular block: Secondary | ICD-10-CM | POA: Insufficient documentation

## 2011-04-29 DIAGNOSIS — F17200 Nicotine dependence, unspecified, uncomplicated: Secondary | ICD-10-CM | POA: Insufficient documentation

## 2011-04-29 LAB — CBC
MCH: 30.9 pg (ref 26.0–34.0)
Platelets: 173 10*3/uL (ref 150–400)
RBC: 4.72 MIL/uL (ref 4.22–5.81)
WBC: 5.4 10*3/uL (ref 4.0–10.5)

## 2011-04-29 LAB — COMPREHENSIVE METABOLIC PANEL
ALT: 13 U/L (ref 0–53)
Albumin: 3.6 g/dL (ref 3.5–5.2)
Alkaline Phosphatase: 42 U/L (ref 39–117)
Chloride: 98 mEq/L (ref 96–112)
GFR calc Af Amer: >60 mL/min (ref >60)
Glucose, Bld: 178 mg/dL — ABNORMAL HIGH (ref 70–99)
Potassium: 3.5 mEq/L (ref 3.5–5.1)
Sodium: 136 mEq/L (ref 135–145)
Total Protein: 6.5 g/dL (ref 6.0–8.3)

## 2011-04-29 LAB — DIFFERENTIAL
Eosinophils Absolute: 0.1 10*3/uL (ref 0.0–0.7)
Lymphs Abs: 2.6 10*3/uL (ref 0.7–4.0)
Neutro Abs: 2.3 10*3/uL (ref 1.7–7.7)
Neutrophils Relative %: 43 % (ref 43–77)

## 2011-04-29 LAB — VALPROIC ACID LEVEL: Valproic Acid Lvl: 79.2 ug/mL (ref 50.0–100.0)

## 2011-04-29 LAB — GLUCOSE, CAPILLARY: Glucose-Capillary: 156 mg/dL — ABNORMAL HIGH (ref 70–99)

## 2011-04-29 MED ORDER — HYDROCODONE-ACETAMINOPHEN 5-325 MG PO TABS
2.0000 | ORAL_TABLET | Freq: Once | ORAL | Status: AC
  Administered 2011-04-29: 2 via ORAL
  Filled 2011-04-29: qty 2

## 2011-04-29 NOTE — ED Provider Notes (Addendum)
History     CSN: [id number] Arrival date & time: 04/29/2011  2:44 PM  Chief Complaint  Patient presents with  . Seizures    (Consider location/radiation/quality/duration/timing/severity/associated sxs/prior treatment) Patient is a 45 y.o. male presenting with seizures. The history is provided by the patient and the EMS personnel. The history is limited by the condition of the patient (The patient is at the time of evaluation lethargic, but arousable to voice and can answer most questions but is still in an apparent postictal delirium which impairs the history and examination ).  Seizures  This is a recurrent problem. The current episode started less than 1 hour ago. The problem has been resolved. There was 1 seizure. Duration: Unknown, as the seizure was unwitnessed. Associated symptoms include sleepiness and confusion. Pertinent negatives include no headaches, no speech difficulty, no visual disturbance, no neck stiffness, no sore throat, no chest pain, no cough, no nausea and no vomiting. Details of the seizure are unknown as no one witnessed it. The episode was not witnessed. There was the sensation of an aura present. The seizures did not continue in the ED. Possible causes do not include med or dosage change, missed seizure meds, recent illness or change in alcohol use. There has been no fever.    Past Medical History  Diagnosis Date  . Seizures   . Anxiety   . Depression   . Diabetes mellitus   . HTN (hypertension)   . Sleep apnea   . Bipolar 1 disorder   . Insomnia     Past Surgical History  Procedure Date  . Mandible fracture surgery   . Knee surgery   . Tonsillectomy     Family History  Problem Relation Age of Onset  . Arthritis    . Diabetes    . Asthma      History  Substance Use Topics  . Smoking status: Current Everyday Smoker -- 1.0 packs/day    Types: Cigarettes  . Smokeless tobacco: Not on file  . Alcohol Use: No      Review of Systems  Unable to  perform ROS HENT: Negative for sore throat.   Eyes: Negative for visual disturbance.  Respiratory: Negative for cough.   Cardiovascular: Negative for chest pain.  Gastrointestinal: Negative for nausea and vomiting.  Neurological: Positive for seizures. Negative for speech difficulty and headaches.  Psychiatric/Behavioral: Positive for confusion.    Allergies  Aspirin  Home Medications   Current Outpatient Rx  Name Route Sig Dispense Refill  . ALPRAZOLAM 2 MG PO TABS Oral Take 2 mg by mouth 3 (three) times daily as needed. For anxiety    . AMLODIPINE BESYLATE 10 MG PO TABS Oral Take 10 mg by mouth daily.      . ATENOLOL 100 MG PO TABS Oral Take 100 mg by mouth daily.      Marland Kitchen DIVALPROEX SODIUM 500 MG PO TBEC Oral Take 500 mg by mouth 3 (three) times daily.      . DULOXETINE HCL 60 MG PO CPEP Oral Take 60 mg by mouth at bedtime.     . IBUPROFEN 800 MG PO TABS Oral Take 800 mg by mouth 2 (two) times daily as needed. FOR PAIN     . INSULIN GLARGINE 100 UNIT/ML  SOLN Subcutaneous Inject 50 Units into the skin at bedtime.     Marland Kitchen LEVETIRACETAM 500 MG PO TABS Oral Take 500 mg by mouth 3 (three) times daily.      Marland Kitchen LISINOPRIL-HYDROCHLOROTHIAZIDE 20-12.5 MG  PO TABS Oral Take 1 tablet by mouth daily.      Marland Kitchen METFORMIN HCL 1000 MG PO TABS Oral Take 1,000 mg by mouth 2 (two) times daily.      . OXYCODONE-ACETAMINOPHEN 5-325 MG PO TABS Oral Take 1 tablet by mouth every 4 (four) hours as needed. For pain      BP 127/92  Pulse 82  Temp(Src) 98 F (36.7 C) (Oral)  Resp 18  Ht 5\' 7"  (1.702 m)  Wt 206 lb (93.441 kg)  BMI 32.26 kg/m2  SpO2 96%  Physical Exam  Nursing note and vitals reviewed. Constitutional: He appears well-developed and well-nourished. He appears lethargic. No distress.       Lethargic but arousable to voice, answers questions.  HENT:  Head: Normocephalic and atraumatic.  Right Ear: External ear normal.  Left Ear: External ear normal.  Nose: Nose normal.  Mouth/Throat:  Oropharynx is clear and moist.  Eyes: Conjunctivae and EOM are normal. Pupils are equal, round, and reactive to light.  Neck: Normal range of motion. Neck supple. No JVD present. No tracheal deviation present.  Cardiovascular: Normal rate, regular rhythm, normal heart sounds and intact distal pulses.  Exam reveals no gallop and no friction rub.   No murmur heard. Pulmonary/Chest: Effort normal and breath sounds normal. No stridor. No respiratory distress. He has no wheezes. He has no rales. He exhibits no tenderness.  Abdominal: Soft. Bowel sounds are normal. He exhibits no distension. There is no tenderness. There is no rebound and no guarding.  Musculoskeletal: Normal range of motion. He exhibits no edema and no tenderness.  Neurological: He has normal strength and normal reflexes. He appears lethargic. He is disoriented. He displays no tremor and normal reflexes. No cranial nerve deficit or sensory deficit. He exhibits normal muscle tone. He displays no seizure activity. Coordination normal. GCS eye subscore is 3. GCS verbal subscore is 5. GCS motor subscore is 6.  Skin: Skin is warm and dry. No rash noted. No erythema. No pallor.    ED Course  Procedures (including critical care time)  Date: 04/29/2011  Rate: 88  Rhythm: normal sinus rhythm  QRS Axis: left  Intervals: normal  ST/T Wave abnormalities: normal  Conduction Disutrbances:left anterior fascicular block  Narrative Interpretation: Non-provocative EKG  Old EKG Reviewed: unchanged   Labs Reviewed  GLUCOSE, CAPILLARY - Abnormal; Notable for the following:    Glucose-Capillary 156 (*)    All other components within normal limits  DIFFERENTIAL - Abnormal; Notable for the following:    Lymphocytes Relative 48 (*)    All other components within normal limits  COMPREHENSIVE METABOLIC PANEL - Abnormal; Notable for the following:    Glucose, Bld 178 (*)    All other components within normal limits  VALPROIC ACID LEVEL  CBC    ETHANOL  LEVETIRACETAM LEVEL  POCT CBG MONITORING  URINE RAPID DRUG SCREEN (HOSP PERFORMED)   No results found.   No diagnosis found.    MDM  Seizure, seizure disorder, subtherapeutic medication level, electrolyte abnormality, arrhythmia, intoxication are all considered amongst other etiologies of the patient's seizure in the differential diagnosis. The patient reports that he has been taking all of his seizure medications, and I suspect that this was a recurrent breakthrough seizure from epilepsy which the patient is known to have. We will observe him in the emergency department to assure that the post ictal period resolves appropriately and assure no other underlying cause is apparent for his seizure.   The patient  is at this time at 7:19 PM awake alert and oriented appropriately moving all extremities with normal coordination and he has normal behavior mood and affect. His neurologic exam is nonfocal. I do not see any need for CT imaging of the brain as this appears to have been epileptic seizure, with a history of epilepsy. His anticonvulsant drug level was within normal limits. I will advise him to followup with neurology sometime next week to discuss medication and/or dosage change. The patient states his understanding of and agreement with the plan of care.     Felisa Bonier, MD 04/29/11 1705  Felisa Bonier, MD 04/29/11 1706  Felisa Bonier, MD 04/29/11 Jerene Bears

## 2011-04-29 NOTE — ED Notes (Signed)
Pt requesting pain medication for headache. Dr. Fredricka Bonine made aware.

## 2011-04-29 NOTE — ED Notes (Signed)
Pt had unwitnessed seizure PTA per EMS. Seen here 9/25 for same. Pt c/o of headache from falling and hitting head during seizure. CBG by EMS 129.

## 2011-04-30 ENCOUNTER — Encounter (HOSPITAL_COMMUNITY): Payer: Self-pay | Admitting: *Deleted

## 2011-04-30 ENCOUNTER — Emergency Department (HOSPITAL_COMMUNITY)
Admission: EM | Admit: 2011-04-30 | Discharge: 2011-05-01 | Disposition: A | Discharge status: Home / Self Care | Payer: Medicaid Other | Attending: Emergency Medicine | Admitting: Emergency Medicine

## 2011-04-30 DIAGNOSIS — F411 Generalized anxiety disorder: Secondary | ICD-10-CM | POA: Insufficient documentation

## 2011-04-30 DIAGNOSIS — Z79899 Other long term (current) drug therapy: Secondary | ICD-10-CM | POA: Insufficient documentation

## 2011-04-30 DIAGNOSIS — I10 Essential (primary) hypertension: Secondary | ICD-10-CM | POA: Insufficient documentation

## 2011-04-30 DIAGNOSIS — F17200 Nicotine dependence, unspecified, uncomplicated: Secondary | ICD-10-CM | POA: Insufficient documentation

## 2011-04-30 DIAGNOSIS — R569 Unspecified convulsions: Secondary | ICD-10-CM | POA: Insufficient documentation

## 2011-04-30 DIAGNOSIS — F319 Bipolar disorder, unspecified: Secondary | ICD-10-CM | POA: Insufficient documentation

## 2011-04-30 DIAGNOSIS — E119 Type 2 diabetes mellitus without complications: Secondary | ICD-10-CM | POA: Insufficient documentation

## 2011-04-30 LAB — BASIC METABOLIC PANEL
BUN: 11 mg/dL (ref 6–23)
Chloride: 106 mEq/L (ref 96–112)
GFR calc Af Amer: >60 mL/min (ref >60)
GFR calc non Af Amer: >60 mL/min (ref >60)
Potassium: 4 mEq/L (ref 3.5–5.1)
Sodium: 142 mEq/L (ref 135–145)

## 2011-04-30 LAB — CBC
Hemoglobin: 13.3 g/dL (ref 13.0–17.0)
Platelets: 165 10*3/uL (ref 150–400)
RBC: 4.25 MIL/uL (ref 4.22–5.81)

## 2011-04-30 LAB — URINALYSIS, ROUTINE W REFLEX MICROSCOPIC
Bilirubin Urine: NEGATIVE
Leukocytes, UA: NEGATIVE
Nitrite: NEGATIVE
Specific Gravity, Urine: 1.015 (ref 1.005–1.030)
Urobilinogen, UA: 0.2 mg/dL (ref 0.0–1.0)
pH: 6.5 (ref 5.0–8.0)

## 2011-04-30 LAB — DIFFERENTIAL
Basophils Relative: 0 % (ref 0–1)
Lymphs Abs: 2.4 10*3/uL (ref 0.7–4.0)
Monocytes Relative: 10 % (ref 3–12)
Neutro Abs: 1.9 10*3/uL (ref 1.7–7.7)
Neutrophils Relative %: 39 % — ABNORMAL LOW (ref 43–77)

## 2011-04-30 LAB — RAPID URINE DRUG SCREEN, HOSP PERFORMED
Barbiturates: NOT DETECTED
Benzodiazepines: POSITIVE — AB
Cocaine: NOT DETECTED
Opiates: NOT DETECTED

## 2011-04-30 LAB — URINE MICROSCOPIC-ADD ON

## 2011-04-30 LAB — LACTIC ACID, PLASMA: Lactic Acid, Venous: 2.8 mmol/L — ABNORMAL HIGH (ref 0.5–2.2)

## 2011-04-30 LAB — GLUCOSE, CAPILLARY: Glucose-Capillary: 235 mg/dL — ABNORMAL HIGH (ref 70–99)

## 2011-04-30 MED ORDER — SODIUM CHLORIDE 0.9 % IV SOLN
INTRAVENOUS | Status: DC
  Administered 2011-04-30: 21:00:00 via INTRAVENOUS

## 2011-04-30 MED ORDER — SODIUM CHLORIDE 0.9 % IV BOLUS (SEPSIS): 1000.0000 mL | Freq: Once | INTRAVENOUS | Status: DC

## 2011-04-30 NOTE — ED Notes (Signed)
Pt reports he had a seizure pta, pt also c/o elevated cbg, ems reports cbg enroute 261

## 2011-04-30 NOTE — ED Provider Notes (Signed)
Scribed for Flint Melter, MD, the patient was seen in room APA06/APA06. This chart was scribed by [employer]. The patient's care started at 21:01  CSN: [id number] Arrival date & time: 04/30/2011  8:16 PM  Chief Complaint  Patient presents with  . Seizures    HPI Melvin Taylor is a 45 y.o. male who presents to the Emergency Department complaining of Seizures pta in ED with associated LOC and headache. Patient complains that he got upset when talking to his cousin on the phone. Reports involuntary fall with loss of consciousness. Reports a history of similar symptoms a yesterday and the day before. Per EMS, patient's blood sugar was 261 en route ED. Patient denies sinus pain, nausea, vomiting, back pain, but reports possible trauma to the head from fall. At this point, patient thinks he had a Seizure but was alone when it happened.  Past Medical History  Diagnosis Date  . Seizures   . Anxiety   . Depression   . Diabetes mellitus   . HTN (hypertension)   . Sleep apnea   . Bipolar 1 disorder   . Insomnia     Past Surgical History  Procedure Date  . Mandible fracture surgery   . Knee surgery   . Tonsillectomy     Family History  Problem Relation Age of Onset  . Arthritis    . Diabetes    . Asthma      History  Substance Use Topics  . Smoking status: Current Everyday Smoker -- 1.0 packs/day    Types: Cigarettes  . Smokeless tobacco: Not on file  . Alcohol Use: No      Review of Systems  HENT: Negative for sinus pressure.   Gastrointestinal: Negative for nausea and vomiting.  Musculoskeletal: Negative for back pain.  Neurological: Positive for headaches.  All other systems reviewed and are negative.    Allergies  Aspirin  Home Medications   Current Outpatient Rx  Name Route Sig Dispense Refill  . ALPRAZOLAM 2 MG PO TABS Oral Take 2 mg by mouth 3 (three) times daily as needed. For anxiety    . AMLODIPINE BESYLATE 10 MG PO TABS Oral Take 10 mg by  mouth daily.      . ATENOLOL 100 MG PO TABS Oral Take 100 mg by mouth daily.      Marland Kitchen DIVALPROEX SODIUM 500 MG PO TBEC Oral Take 500 mg by mouth 3 (three) times daily.      . DULOXETINE HCL 60 MG PO CPEP Oral Take 60 mg by mouth at bedtime.     . IBUPROFEN 800 MG PO TABS Oral Take 800 mg by mouth 2 (two) times daily as needed. FOR PAIN     . INSULIN GLARGINE 100 UNIT/ML [place] SOLN Subcutaneous Inject 50 Units into the skin at bedtime.     Marland Kitchen LEVETIRACETAM 500 MG PO TABS Oral Take 500 mg by mouth 3 (three) times daily.      Marland Kitchen LISINOPRIL-HYDROCHLOROTHIAZIDE 20-12.5 MG PO TABS Oral Take 1 tablet by mouth daily.      Marland Kitchen METFORMIN HCL 1000 MG PO TABS Oral Take 1,000 mg by mouth 2 (two) times daily.      . OXYCODONE-ACETAMINOPHEN 5-325 MG PO TABS Oral Take 1 tablet by mouth every 4 (four) hours as needed. For pain      BP 136/78  Pulse 78  Temp(Src) 98.1 F (36.7 C) (Oral)  Resp 18  SpO2 97%  Physical Exam  Nursing note and vitals  reviewed. Constitutional: He is oriented to person, place, and time. He appears well-developed and well-nourished. No distress.       Awake, alert, nontoxic appearance with baseline speech for patient.  HENT:  Head: Normocephalic.  Mouth/Throat: Oropharynx is clear and moist. No oropharyngeal exudate.       No abrasions on his tongue  Eyes: EOM are normal. Pupils are equal, round, and reactive to light. Right eye exhibits no discharge. Left eye exhibits no discharge.  Neck: Neck supple.  Cardiovascular: Normal rate, regular rhythm and normal heart sounds.   No murmur heard. Pulmonary/Chest: Effort normal and breath sounds normal. No stridor. No respiratory distress. He has no wheezes. He has no rales. He exhibits no tenderness.  Abdominal: Soft. Bowel sounds are normal. He exhibits no distension and no mass. There is no tenderness. There is no rebound and no guarding.  Musculoskeletal: He exhibits no tenderness.       Baseline ROM, moves extremities with no obvious new  focal weakness.  Limited ROM in bilateral lower extremities due chronic back pain  Lymphadenopathy:    He has no cervical adenopathy.  Neurological: He is alert and oriented to person, place, and time. No cranial nerve deficit.       Awake, alert, cooperative and aware of situation; motor strength bilaterally; no facial asymmetry; tongue midline; major cranial nerves appear intact;  Skin: Skin is warm and dry. No rash noted. No erythema.  Psychiatric: He has a normal mood and affect.    ED Course  Procedures  OTHER DATA REVIEWED: Nursing notes, vital signs, and past medical records reviewed.    DIAGNOSTIC STUDIES: Oxygen Saturation is 97% on room air, normal by my interpretation.    LABS / RADIOLOGY: Laboratory abnormalities include benzodiazepines present. UDS urine glucose, greater than 1000 of glucose high at 247, valproate level low at 22.5. Lactic acid level, slightly high at 2.8  Results for orders placed during the hospital encounter of 04/30/11  CBC      Component Value Range   WBC 4.8  4.0 - 10.5 (K/uL)   RBC 4.25  4.22 - 5.81 (MIL/uL)   Hemoglobin 13.3  13.0 - 17.0 (g/dL)   HCT 16.1 (*) 09.6 - 52.0 (%)   MCV 89.6  78.0 - 100.0 (fL)   MCH 31.3  26.0 - 34.0 (pg)   MCHC 34.9  30.0 - 36.0 (g/dL)   RDW 04.5  40.9 - 81.1 (%)   Platelets 165  150 - 400 (K/uL)  DIFFERENTIAL      Component Value Range   Neutrophils Relative 39 (*) 43 - 77 (%)   Neutro Abs 1.9  1.7 - 7.7 (K/uL)   Lymphocytes Relative 50 (*) 12 - 46 (%)   Lymphs Abs 2.4  0.7 - 4.0 (K/uL)   Monocytes Relative 10  3 - 12 (%)   Monocytes Absolute 0.5  0.1 - 1.0 (K/uL)   Eosinophils Relative 1  0 - 5 (%)   Eosinophils Absolute 0.1  0.0 - 0.7 (K/uL)   Basophils Relative 0  0 - 1 (%)   Basophils Absolute 0.0  0.0 - 0.1 (K/uL)  BASIC METABOLIC PANEL      Component Value Range   Sodium 142  135 - 145 (mEq/L)   Potassium 4.0  3.5 - 5.1 (mEq/L)   Chloride 106  96 - 112 (mEq/L)   CO2 28  19 - 32 (mEq/L)    Glucose, Bld 247 (*) 70 - 99 (mg/dL)   BUN  11  6 - 23 (mg/dL)   Creatinine, Ser 1.61  0.50 - 1.35 (mg/dL)   Calcium 8.9  8.4 - 09.6 (mg/dL)   GFR calc non Af Amer >60  >60 (mL/min)   GFR calc Af Amer >60  >60 (mL/min)  URINALYSIS, ROUTINE W REFLEX MICROSCOPIC      Component Value Range   Color, Urine YELLOW  YELLOW    Appearance CLEAR  CLEAR    Specific Gravity, Urine 1.015  1.005 - 1.030    pH 6.5  5.0 - 8.0    Glucose, UA >1000 (*) NEGATIVE (mg/dL)   Hgb urine dipstick NEGATIVE  NEGATIVE    Bilirubin Urine NEGATIVE  NEGATIVE    Ketones, ur NEGATIVE  NEGATIVE (mg/dL)   Protein, ur NEGATIVE  NEGATIVE (mg/dL)   Urobilinogen, UA 0.2  0.0 - 1.0 (mg/dL)   Nitrite NEGATIVE  NEGATIVE    Leukocytes, UA NEGATIVE  NEGATIVE   URINE RAPID DRUG SCREEN (HOSP PERFORMED)      Component Value Range   Opiates NONE DETECTED  NONE DETECTED    Cocaine NONE DETECTED  NONE DETECTED    Benzodiazepines POSITIVE (*) NONE DETECTED    Amphetamines NONE DETECTED  NONE DETECTED    Tetrahydrocannabinol NONE DETECTED  NONE DETECTED    Barbiturates NONE DETECTED  NONE DETECTED   ETHANOL      Component Value Range   Alcohol, Ethyl (B) <11  0 - 11 (mg/dL)  VALPROIC ACID LEVEL      Component Value Range   Valproic Acid Lvl 22.5 (*) 50.0 - 100.0 (ug/mL)  LACTIC ACID, PLASMA      Component Value Range   Lactic Acid, Venous 2.8 (*) 0.5 - 2.2 (mmol/L)  GLUCOSE, CAPILLARY      Component Value Range   Glucose-Capillary 235 (*) 70 - 99 (mg/dL)  URINE MICROSCOPIC-ADD ON      Component Value Range   WBC, UA 0-2  <3 (WBC/hpf)   RBC / HPF 0-2  <3 (RBC/hpf)      ED COURSE / COORDINATION OF CARE: 21:05 - EDP examined patient at bed side and ordered the following Orders Placed This Encounter  Procedures  . CBC  . Differential  . Basic metabolic panel  . Urinalysis, Routine w reflex microscopic  . Urine rapid drug screen (hosp performed)  . Ethanol  . Valproic acid level  . Lactic acid, plasma  . Glucose,  capillary   oral Depakote order to boost level.  MDM: Seizure, secondary to low Depakote level. Doubt head trauma, occult infection, metabolic instability. Glucose, elevated without ketosis or elevated anion gap.  IMPRESSION: #1 seizure secondary to low Depakote level. #2 hyperglycemia  PLAN:  Home  The patient is to return the emergency department if there is any worsening of symptoms. I have reviewed the discharge instructions with the patient.  CONDITION ON DISCHARGE: Good  MEDICATIONS GIVEN IN THE E.D.  Medications  0.9 %  sodium chloride infusion (  Intravenous New Bag 04/30/11 2115)  sodium chloride 0.9 % bolus 1,000 mL (not administered)    DISCHARGE MEDICATIONS: New Prescriptions   No medications on file    SCRIBE ATTESTATION:I personally performed the services described in this documentation, which was scribed in my presence. The recorded information has been reviewed and considered. Flint Melter, MD   Flint Melter, MD 05/01/11 [phone]

## 2011-05-01 MED ORDER — OXYCODONE-ACETAMINOPHEN 5-325 MG PO TABS
1.0000 | ORAL_TABLET | Freq: Once | ORAL | Status: AC
  Administered 2011-05-01: 1 via ORAL
  Filled 2011-05-01: qty 1

## 2011-05-01 MED ORDER — DIVALPROEX SODIUM 250 MG PO DR TAB
1000.0000 mg | DELAYED_RELEASE_TABLET | Freq: Once | ORAL | Status: AC
  Administered 2011-05-01: 1000 mg via ORAL
  Filled 2011-05-01: qty 4

## 2011-05-01 NOTE — ED Notes (Signed)
Pt left er stating no needs 

## 2011-05-02 LAB — POCT I-STAT, CHEM 8
BUN: 6
Calcium, Ion: 1.13
HCT: 47
Hemoglobin: 16
Sodium: 141
TCO2: 28

## 2011-05-02 LAB — VALPROIC ACID LEVEL: Valproic Acid Lvl: 95

## 2011-05-03 LAB — LEVETIRACETAM LEVEL: Levetiracetam Lvl: 15.9 ug/mL (ref 5.0–30.0)

## 2011-05-04 LAB — GLUCOSE, CAPILLARY: Glucose-Capillary: 99

## 2011-05-04 LAB — VALPROIC ACID LEVEL: Valproic Acid Lvl: 133.1 — ABNORMAL HIGH

## 2011-05-06 LAB — RAPID URINE DRUG SCREEN, HOSP PERFORMED
Cocaine: NOT DETECTED
Tetrahydrocannabinol: NOT DETECTED

## 2011-05-06 LAB — DIFFERENTIAL
Eosinophils Absolute: 0.1 10*3/uL (ref 0.0–0.7)
Lymphs Abs: 2.4 10*3/uL (ref 0.7–4.0)
Monocytes Absolute: 0.6 10*3/uL (ref 0.1–1.0)
Monocytes Relative: 12 % (ref 3–12)
Neutro Abs: 2.1 10*3/uL (ref 1.7–7.7)
Neutrophils Relative %: 40 % — ABNORMAL LOW (ref 43–77)

## 2011-05-06 LAB — CBC
Hemoglobin: 14.9 g/dL (ref 13.0–17.0)
MCV: 90.9 fL (ref 78.0–100.0)
RBC: 4.71 MIL/uL (ref 4.22–5.81)
WBC: 5.3 10*3/uL (ref 4.0–10.5)

## 2011-05-06 LAB — BASIC METABOLIC PANEL
CO2: 28 mEq/L (ref 19–32)
Chloride: 105 mEq/L (ref 96–112)
Creatinine, Ser: 1.03 mg/dL (ref 0.4–1.5)
GFR calc Af Amer: >60 mL/min (ref >60)
Sodium: 134 mEq/L — ABNORMAL LOW (ref 135–145)

## 2011-05-12 LAB — BASIC METABOLIC PANEL
CO2: 26
Chloride: 108
GFR calc Af Amer: >60
Glucose, Bld: 107 — ABNORMAL HIGH
Sodium: 141

## 2011-06-10 ENCOUNTER — Other Ambulatory Visit (HOSPITAL_COMMUNITY): Payer: Self-pay | Admitting: Internal Medicine

## 2011-06-10 DIAGNOSIS — E0590 Thyrotoxicosis, unspecified without thyrotoxic crisis or storm: Secondary | ICD-10-CM

## 2011-06-14 ENCOUNTER — Ambulatory Visit (HOSPITAL_COMMUNITY)
Admission: RE | Admit: 2011-06-14 | Discharge: 2011-06-14 | Disposition: A | Discharge status: Home / Self Care | Payer: Medicaid Other | Source: Ambulatory Visit | Attending: Internal Medicine | Admitting: Internal Medicine

## 2011-06-14 ENCOUNTER — Ambulatory Visit (HOSPITAL_COMMUNITY)
Admission: RE | Admit: 2011-06-14 | Discharge: 2011-06-14 | Payer: Medicaid Other | Source: Ambulatory Visit | Attending: Internal Medicine | Admitting: Internal Medicine

## 2011-06-14 DIAGNOSIS — R599 Enlarged lymph nodes, unspecified: Secondary | ICD-10-CM | POA: Insufficient documentation

## 2011-06-14 DIAGNOSIS — E0590 Thyrotoxicosis, unspecified without thyrotoxic crisis or storm: Secondary | ICD-10-CM

## 2011-06-14 DIAGNOSIS — E049 Nontoxic goiter, unspecified: Secondary | ICD-10-CM | POA: Insufficient documentation

## 2011-06-28 ENCOUNTER — Emergency Department (HOSPITAL_COMMUNITY)
Admission: EM | Admit: 2011-06-28 | Discharge: 2011-06-28 | Disposition: A | Discharge status: Home / Self Care | Payer: Medicaid Other | Attending: Emergency Medicine | Admitting: Emergency Medicine

## 2011-06-28 ENCOUNTER — Encounter (HOSPITAL_COMMUNITY): Payer: Self-pay | Admitting: Emergency Medicine

## 2011-06-28 DIAGNOSIS — W06XXXA Fall from bed, initial encounter: Secondary | ICD-10-CM | POA: Insufficient documentation

## 2011-06-28 DIAGNOSIS — S20229A Contusion of unspecified back wall of thorax, initial encounter: Secondary | ICD-10-CM | POA: Insufficient documentation

## 2011-06-28 DIAGNOSIS — Z794 Long term (current) use of insulin: Secondary | ICD-10-CM | POA: Insufficient documentation

## 2011-06-28 DIAGNOSIS — S0083XA Contusion of other part of head, initial encounter: Secondary | ICD-10-CM

## 2011-06-28 DIAGNOSIS — Y92009 Unspecified place in unspecified non-institutional (private) residence as the place of occurrence of the external cause: Secondary | ICD-10-CM | POA: Insufficient documentation

## 2011-06-28 DIAGNOSIS — F319 Bipolar disorder, unspecified: Secondary | ICD-10-CM | POA: Insufficient documentation

## 2011-06-28 DIAGNOSIS — I10 Essential (primary) hypertension: Secondary | ICD-10-CM | POA: Insufficient documentation

## 2011-06-28 DIAGNOSIS — S0003XA Contusion of scalp, initial encounter: Secondary | ICD-10-CM | POA: Insufficient documentation

## 2011-06-28 DIAGNOSIS — G4730 Sleep apnea, unspecified: Secondary | ICD-10-CM | POA: Insufficient documentation

## 2011-06-28 DIAGNOSIS — E119 Type 2 diabetes mellitus without complications: Secondary | ICD-10-CM | POA: Insufficient documentation

## 2011-06-28 DIAGNOSIS — S300XXA Contusion of lower back and pelvis, initial encounter: Secondary | ICD-10-CM

## 2011-06-28 DIAGNOSIS — F17200 Nicotine dependence, unspecified, uncomplicated: Secondary | ICD-10-CM | POA: Insufficient documentation

## 2011-06-28 DIAGNOSIS — F411 Generalized anxiety disorder: Secondary | ICD-10-CM | POA: Insufficient documentation

## 2011-06-28 MED ORDER — HYDROCODONE-ACETAMINOPHEN 5-325 MG PO TABS
1.0000 | ORAL_TABLET | Freq: Once | ORAL | Status: AC
  Administered 2011-06-28: 1 via ORAL
  Filled 2011-06-28: qty 1

## 2011-06-28 MED ORDER — CARISOPRODOL 350 MG PO TABS: 350.0000 mg | ORAL_TABLET | Freq: Three times a day (TID) | ORAL | Status: DC

## 2011-06-28 MED ORDER — HYDROCODONE-ACETAMINOPHEN 5-325 MG PO TABS: ORAL_TABLET | ORAL | Status: DC

## 2011-06-28 MED ORDER — DIFLUNISAL 500 MG PO TABS: 500.0000 mg | ORAL_TABLET | Freq: Two times a day (BID) | ORAL | Status: DC | PRN

## 2011-06-28 MED ORDER — IBUPROFEN 800 MG PO TABS
800.0000 mg | ORAL_TABLET | Freq: Once | ORAL | Status: AC
  Administered 2011-06-28: 800 mg via ORAL
  Filled 2011-06-28: qty 1

## 2011-06-28 NOTE — ED Provider Notes (Signed)
History     CSN: [id number] Arrival date & time: 06/28/2011 10:40 AM   First MD Initiated Contact with Patient 06/28/11 1041      Chief Complaint  Patient presents with  . Back Pain  . Headache  . Fatigue    (Consider location/radiation/quality/duration/timing/severity/associated sxs/prior treatment) HPI Comments: Pt states he did not feel well yest but had no specific sxs.  He awakened on the floor this AM and assumed he rolled out of bed.  Although he has a seizure disorder he states he felt around in his mouth and "didn't chew up my tongue"..  He has no other explanation for why he fell out of bed.  He has moderate low back pain and L forehead pain.  He states he doesn't want any x-rays or a CT scan, he justs wants something for his pain and to go home and sleep.  Patient is a 45 y.o. male presenting with back pain and headaches. The history is provided by the patient. No language interpreter was used.  Back Pain  This is a new problem. The problem has not changed since onset.The pain is associated with falling. The pain is present in the lumbar spine (R forehead). The quality of the pain is described as aching. The pain does not radiate. The pain is moderate. The symptoms are aggravated by bending and twisting. The pain is the same all the time. Associated symptoms include headaches. Pertinent negatives include no fever, no abdominal pain, no bowel incontinence and no bladder incontinence. Treatments tried: tylenol. The treatment provided no relief.  Headache  Pertinent negatives include no fever.    Past Medical History  Diagnosis Date  . Seizures   . Anxiety   . Depression   . Diabetes mellitus   . HTN (hypertension)   . Sleep apnea   . Bipolar 1 disorder   . Insomnia     Past Surgical History  Procedure Date  . Mandible fracture surgery   . Knee surgery   . Tonsillectomy     Family History  Problem Relation Age of Onset  . Arthritis    . Diabetes    . Asthma        History  Substance Use Topics  . Smoking status: Current Everyday Smoker -- 1.0 packs/day for 30 years    Types: Cigarettes  . Smokeless tobacco: Never Used  . Alcohol Use: No      Review of Systems  Constitutional: Negative for fever.  Gastrointestinal: Negative for abdominal pain and bowel incontinence.  Genitourinary: Negative for bladder incontinence.  Musculoskeletal: Positive for back pain.  Neurological: Positive for headaches.  All other systems reviewed and are negative.    Allergies  Aspirin  Home Medications   Current Outpatient Rx  Name Route Sig Dispense Refill  . ALPRAZOLAM 2 MG PO TABS Oral Take 2 mg by mouth 3 (three) times daily as needed. For anxiety    . AMLODIPINE BESYLATE 10 MG PO TABS Oral Take 10 mg by mouth daily.      . ATENOLOL 100 MG PO TABS Oral Take 100 mg by mouth daily.      Marland Kitchen DIVALPROEX SODIUM 500 MG PO TBEC Oral Take 500 mg by mouth 3 (three) times daily.      . DULOXETINE HCL 60 MG PO CPEP Oral Take 60 mg by mouth at bedtime.     . IBUPROFEN 800 MG PO TABS Oral Take 800 mg by mouth 2 (two) times daily as needed. FOR  PAIN     . INSULIN GLARGINE 100 UNIT/ML [place] SOLN Subcutaneous Inject 50 Units into the skin at bedtime.     Marland Kitchen LEVETIRACETAM 500 MG PO TABS Oral Take 500 mg by mouth 3 (three) times daily.      Marland Kitchen LISINOPRIL-HYDROCHLOROTHIAZIDE 20-12.5 MG PO TABS Oral Take 1 tablet by mouth daily.      Marland Kitchen METFORMIN HCL 1000 MG PO TABS Oral Take 1,000 mg by mouth 2 (two) times daily.      . OXYCODONE-ACETAMINOPHEN 5-325 MG PO TABS Oral Take 1 tablet by mouth every 4 (four) hours as needed. For pain      BP 125/85  Pulse 81  Temp(Src) 98.6 F (37 C) (Oral)  Resp 16  Ht 5\' 6"  (1.676 m)  Wt 210 lb (95.255 kg)  BMI 33.89 kg/m2  SpO2 100%  Physical Exam  Nursing note and vitals reviewed. Constitutional: He is oriented to person, place, and time. He appears well-developed and well-nourished.  HENT:  Head: Normocephalic. Head is with  contusion. Head is without raccoon's eyes, without Battle's sign, without abrasion, without laceration, without right periorbital erythema and without left periorbital erythema.    Right Ear: External ear normal.  Left Ear: External ear normal.  Eyes: EOM are normal. Pupils are equal, round, and reactive to light.  Neck: Normal range of motion. Neck supple.  Cardiovascular: Normal rate, regular rhythm, normal heart sounds and intact distal pulses.   Pulmonary/Chest: Effort normal and breath sounds normal. No respiratory distress.  Abdominal: Soft. He exhibits no distension. There is no tenderness.  Musculoskeletal: Normal range of motion. He exhibits tenderness.       Lumbar back: He exhibits tenderness and bony tenderness. He exhibits normal range of motion, no swelling, no deformity, no laceration, no pain, no spasm and normal pulse.       Back:  Neurological: He is alert and oriented to person, place, and time. He has normal strength. No cranial nerve deficit or sensory deficit. He displays a negative Romberg sign. He displays no seizure activity. GCS eye subscore is 4. GCS verbal subscore is 5. GCS motor subscore is 6.  Skin: Skin is warm and dry.  Psychiatric: He has a normal mood and affect. Judgment normal.    ED Course  Procedures (including critical care time)   Labs Reviewed  POCT CBG MONITORING   No results found.   No diagnosis found.    MDM          Worthy Rancher, PA 06/28/11 [phone]

## 2011-06-28 NOTE — ED Notes (Signed)
Patient c/o lower back pain, headache, and weakness. Per patient fell off of bed last night after possible seizure.

## 2011-06-28 NOTE — ED Provider Notes (Signed)
Medical screening examination/treatment/procedure(s) were performed by non-physician practitioner and as supervising physician I was immediately available for consultation/collaboration.    Celene Kras, MD 06/28/11 [phone]

## 2011-06-28 NOTE — ED Notes (Signed)
Pt alert and oriented with respirations even and unlabored.  Discharge instructions reviewed with pt and pt verbalized understanding.  NAD at this time.  Pt verbalized that his friend will transport him home.

## 2011-06-28 NOTE — ED Notes (Signed)
Pt states he took a nap yesterday afternoon and woke up at approx 4:30 pm yesterday evening on the floor.  Pt states he "couldn't get up for a while" due to pain in lower back.  Pt states he thinks he "blacked out" and states he thinks he hit his back on his bedside table.  Pt denies loss of continence.  Pt states generalized weakness.  Pt is alert and oriented with respirations even and unlabored.  NAD at this time.

## 2011-06-30 ENCOUNTER — Emergency Department (HOSPITAL_COMMUNITY)
Admission: EM | Admit: 2011-06-30 | Discharge: 2011-06-30 | Disposition: A | Discharge status: Home / Self Care | Payer: Medicaid Other | Attending: Emergency Medicine | Admitting: Emergency Medicine

## 2011-06-30 ENCOUNTER — Encounter (HOSPITAL_COMMUNITY): Payer: Self-pay | Admitting: *Deleted

## 2011-06-30 ENCOUNTER — Emergency Department (HOSPITAL_COMMUNITY): Payer: Medicaid Other

## 2011-06-30 DIAGNOSIS — I10 Essential (primary) hypertension: Secondary | ICD-10-CM | POA: Insufficient documentation

## 2011-06-30 DIAGNOSIS — R569 Unspecified convulsions: Secondary | ICD-10-CM | POA: Insufficient documentation

## 2011-06-30 DIAGNOSIS — R05 Cough: Secondary | ICD-10-CM

## 2011-06-30 DIAGNOSIS — F319 Bipolar disorder, unspecified: Secondary | ICD-10-CM | POA: Insufficient documentation

## 2011-06-30 DIAGNOSIS — F411 Generalized anxiety disorder: Secondary | ICD-10-CM | POA: Insufficient documentation

## 2011-06-30 DIAGNOSIS — R059 Cough, unspecified: Secondary | ICD-10-CM | POA: Insufficient documentation

## 2011-06-30 DIAGNOSIS — R079 Chest pain, unspecified: Secondary | ICD-10-CM | POA: Insufficient documentation

## 2011-06-30 DIAGNOSIS — J399 Disease of upper respiratory tract, unspecified: Secondary | ICD-10-CM

## 2011-06-30 DIAGNOSIS — F17200 Nicotine dependence, unspecified, uncomplicated: Secondary | ICD-10-CM | POA: Insufficient documentation

## 2011-06-30 DIAGNOSIS — E119 Type 2 diabetes mellitus without complications: Secondary | ICD-10-CM | POA: Insufficient documentation

## 2011-06-30 DIAGNOSIS — D696 Thrombocytopenia, unspecified: Secondary | ICD-10-CM | POA: Insufficient documentation

## 2011-06-30 DIAGNOSIS — D72819 Decreased white blood cell count, unspecified: Secondary | ICD-10-CM | POA: Insufficient documentation

## 2011-06-30 DIAGNOSIS — R062 Wheezing: Secondary | ICD-10-CM | POA: Insufficient documentation

## 2011-06-30 LAB — CBC
HCT: 39.6 % (ref 39.0–52.0)
Hemoglobin: 13.4 g/dL (ref 13.0–17.0)
MCH: 30.9 pg (ref 26.0–34.0)
MCHC: 33.8 g/dL (ref 30.0–36.0)
MCV: 91.2 fL (ref 78.0–100.0)
Platelets: 134 10*3/uL — ABNORMAL LOW (ref 150–400)
RBC: 4.34 MIL/uL (ref 4.22–5.81)
RDW: 13.1 % (ref 11.5–15.5)
WBC: 3.9 10*3/uL — ABNORMAL LOW (ref 4.0–10.5)

## 2011-06-30 LAB — BASIC METABOLIC PANEL
BUN: 7 mg/dL (ref 6–23)
CO2: 28 mEq/L (ref 19–32)
Calcium: 9.2 mg/dL (ref 8.4–10.5)
Chloride: 99 mEq/L (ref 96–112)
Creatinine, Ser: 1 mg/dL (ref 0.50–1.35)
GFR calc Af Amer: >90 mL/min (ref >90)
GFR calc non Af Amer: 88 mL/min — ABNORMAL LOW (ref >90)
Glucose, Bld: 212 mg/dL — ABNORMAL HIGH (ref 70–99)
Potassium: 3.7 mEq/L (ref 3.5–5.1)
Sodium: 135 mEq/L (ref 135–145)

## 2011-06-30 LAB — TROPONIN I: Troponin I: <0.3 ng/mL (ref <0.30)

## 2011-06-30 LAB — POCT I-STAT TROPONIN I: Troponin i, poc: 0 ng/mL (ref 0.00–0.08)

## 2011-06-30 MED ORDER — IPRATROPIUM BROMIDE 0.02 % IN SOLN
0.5000 mg | Freq: Once | RESPIRATORY_TRACT | Status: AC
  Administered 2011-06-30: 0.5 mg via RESPIRATORY_TRACT
  Filled 2011-06-30: qty 2.5

## 2011-06-30 MED ORDER — ALBUTEROL SULFATE (5 MG/ML) 0.5% IN NEBU
5.0000 mg | INHALATION_SOLUTION | Freq: Once | RESPIRATORY_TRACT | Status: AC
  Administered 2011-06-30: 5 mg via RESPIRATORY_TRACT
  Filled 2011-06-30: qty 1

## 2011-06-30 MED ORDER — ALBUTEROL SULFATE HFA 108 (90 BASE) MCG/ACT IN AERS: 1.0000 | INHALATION_SPRAY | Freq: Four times a day (QID) | RESPIRATORY_TRACT | Status: DC | PRN

## 2011-06-30 MED ORDER — GUAIFENESIN-CODEINE 100-10 MG/5ML PO SYRP: 5.0000 mL | ORAL_SOLUTION | Freq: Three times a day (TID) | ORAL | Status: DC | PRN

## 2011-06-30 NOTE — ED Notes (Signed)
Pt reports substernal cp for 1 day. Pain described as a burning. Hurts worse when coughs.
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
## 2011-06-30 NOTE — ED Notes (Signed)
C/O CP esp with coughing spells center chest described as aching

## 2011-07-03 ENCOUNTER — Emergency Department (HOSPITAL_COMMUNITY)
Admission: EM | Admit: 2011-07-03 | Discharge: 2011-07-03 | Disposition: A | Discharge status: Home / Self Care | Payer: Medicaid Other | Attending: Emergency Medicine | Admitting: Emergency Medicine

## 2011-07-03 ENCOUNTER — Encounter (HOSPITAL_COMMUNITY): Payer: Self-pay | Admitting: *Deleted

## 2011-07-03 ENCOUNTER — Emergency Department (HOSPITAL_COMMUNITY): Payer: Medicaid Other

## 2011-07-03 DIAGNOSIS — R569 Unspecified convulsions: Secondary | ICD-10-CM | POA: Insufficient documentation

## 2011-07-03 DIAGNOSIS — I10 Essential (primary) hypertension: Secondary | ICD-10-CM | POA: Insufficient documentation

## 2011-07-03 DIAGNOSIS — Z794 Long term (current) use of insulin: Secondary | ICD-10-CM | POA: Insufficient documentation

## 2011-07-03 DIAGNOSIS — F411 Generalized anxiety disorder: Secondary | ICD-10-CM | POA: Insufficient documentation

## 2011-07-03 DIAGNOSIS — J40 Bronchitis, not specified as acute or chronic: Secondary | ICD-10-CM | POA: Insufficient documentation

## 2011-07-03 DIAGNOSIS — E119 Type 2 diabetes mellitus without complications: Secondary | ICD-10-CM | POA: Insufficient documentation

## 2011-07-03 DIAGNOSIS — F319 Bipolar disorder, unspecified: Secondary | ICD-10-CM | POA: Insufficient documentation

## 2011-07-03 DIAGNOSIS — F17200 Nicotine dependence, unspecified, uncomplicated: Secondary | ICD-10-CM | POA: Insufficient documentation

## 2011-07-03 MED ORDER — AZITHROMYCIN 250 MG PO TABS: 250.0000 mg | ORAL_TABLET | Freq: Every day | ORAL | Status: AC

## 2011-07-03 MED ORDER — HYDROCODONE-ACETAMINOPHEN 5-325 MG PO TABS: 1.0000 | ORAL_TABLET | Freq: Four times a day (QID) | ORAL | Status: DC | PRN

## 2011-07-03 MED ORDER — ALBUTEROL SULFATE HFA 108 (90 BASE) MCG/ACT IN AERS
INHALATION_SPRAY | RESPIRATORY_TRACT | Status: AC
  Filled 2011-07-03: qty 6.7

## 2011-07-03 MED ORDER — AZITHROMYCIN 250 MG PO TABS: 250.0000 mg | ORAL_TABLET | Freq: Every day | ORAL | Status: DC

## 2011-07-03 MED ORDER — AZITHROMYCIN 250 MG PO TABS
ORAL_TABLET | ORAL | Status: AC
  Filled 2011-07-03: qty 2

## 2011-07-03 MED ORDER — OXYCODONE-ACETAMINOPHEN 5-325 MG PO TABS
1.0000 | ORAL_TABLET | Freq: Once | ORAL | Status: AC
  Administered 2011-07-03: 1 via ORAL
  Filled 2011-07-03: qty 1

## 2011-07-03 MED ORDER — AZITHROMYCIN 250 MG PO TABS
500.0000 mg | ORAL_TABLET | Freq: Once | ORAL | Status: AC
  Administered 2011-07-03: 500 mg via ORAL

## 2011-07-03 MED ORDER — DEXTROSE 5 % IV SOLN: 500.0000 mg | INTRAVENOUS | Status: DC

## 2011-07-03 MED ORDER — ALBUTEROL SULFATE HFA 108 (90 BASE) MCG/ACT IN AERS
2.0000 | INHALATION_SPRAY | Freq: Once | RESPIRATORY_TRACT | Status: AC
  Administered 2011-07-03: 2 via RESPIRATORY_TRACT
  Filled 2011-07-03: qty 6.7

## 2011-07-03 NOTE — ED Notes (Signed)
Pt c/o central chest pain that hurts most when he coughs; pt states he has headache and states it feels like he had a seizure because his tongue feels it is thick; pt states it feels like he has been biting on his tongue

## 2011-07-03 NOTE — ED Notes (Signed)
Patient requesting any medications that are for prescription to be given before discharge due to his pharmacy being closed today. EDP made aware.

## 2011-07-03 NOTE — ED Provider Notes (Addendum)
History     CSN: [id number] Arrival date & time: 07/03/2011 12:05 PM   First MD Initiated Contact with Patient 07/03/11 1333      Chief Complaint  Patient presents with  . Chest Pain  . Back Pain  . Headache    (Consider location/radiation/quality/duration/timing/severity/associated sxs/prior treatment) Patient is a 45 y.o. male presenting with cough. The history is provided by the patient (The patient complains of a cough with yellow sputum production he is having some pain in his chest with the patient had a chest x-ray last week which did not show any pneumonia. He states that he continued to cough with yellow sputum production and having ). No language interpreter was used.  Cough This is a new problem. The current episode started more than 2 days ago. The problem occurs constantly. The problem has not changed since onset.The cough is productive of sputum. There has been no fever. Associated symptoms include chest pain. Pertinent negatives include no chills, no ear congestion and no headaches. He has tried nothing for the symptoms. He is a smoker. His past medical history is significant for bronchiectasis and COPD.    Past Medical History  Diagnosis Date  . Seizures   . Anxiety   . Depression   . Diabetes mellitus   . HTN (hypertension)   . Sleep apnea   . Bipolar 1 disorder   . Insomnia     Past Surgical History  Procedure Date  . Mandible fracture surgery   . Knee surgery   . Tonsillectomy     Family History  Problem Relation Age of Onset  . Arthritis    . Diabetes    . Asthma      History  Substance Use Topics  . Smoking status: Current Everyday Smoker -- 1.0 packs/day for 30 years    Types: Cigarettes  . Smokeless tobacco: Never Used  . Alcohol Use: No      Review of Systems  Constitutional: Negative for chills and fatigue.  HENT: Negative for congestion, sinus pressure and ear discharge.   Eyes: Negative for discharge.  Respiratory: Positive for  cough.   Cardiovascular: Positive for chest pain.  Gastrointestinal: Negative for abdominal pain and diarrhea.  Genitourinary: Negative for frequency and hematuria.  Musculoskeletal: Negative for back pain.  Skin: Negative for rash.  Neurological: Negative for seizures and headaches.  Hematological: Negative.   Psychiatric/Behavioral: Negative for hallucinations.    Allergies  Aspirin  Home Medications   Current Outpatient Rx  Name Route Sig Dispense Refill  . ALBUTEROL SULFATE HFA 108 (90 BASE) MCG/ACT IN AERS Inhalation Inhale 1-2 puffs into the lungs every 6 (six) hours as needed for wheezing. 1 Inhaler 0  . ALPRAZOLAM 2 MG PO TABS Oral Take 2 mg by mouth 4 (four) times daily.     Marland Kitchen AMLODIPINE BESYLATE 10 MG PO TABS Oral Take 10 mg by mouth daily.      . ATENOLOL 100 MG PO TABS Oral Take 100 mg by mouth daily.      . AZITHROMYCIN 250 MG PO TABS Oral Take 1 tablet (250 mg total) by mouth daily. Take one tablet a day 4 tablet 0  . DIFLUNISAL 500 MG PO TABS Oral Take 1 tablet (500 mg total) by mouth 2 (two) times daily as needed. 20 tablet 0  . DIVALPROEX SODIUM 500 MG PO TBEC Oral Take 500 mg by mouth 3 (three) times daily.      . DULOXETINE HCL 60 MG PO CPEP  Oral Take 60 mg by mouth at bedtime.     . GUAIFENESIN-CODEINE 100-10 MG/5ML PO SYRP Oral Take 5 mLs by mouth 3 (three) times daily as needed for cough. 120 mL 0  . HYDROCODONE-ACETAMINOPHEN 5-325 MG PO TABS  One po q 4-6 hrs prn pain 20 tablet 0  . HYDROCODONE-ACETAMINOPHEN 5-325 MG PO TABS Oral Take 1 tablet by mouth every 6 (six) hours as needed for pain. 20 tablet 0  . HYDROCODONE-ACETAMINOPHEN 7.5-325 MG PO TABS Oral Take 1 tablet by mouth as needed.      . IBUPROFEN 800 MG PO TABS Oral Take 800 mg by mouth 2 (two) times daily as needed. FOR PAIN     . INSULIN GLARGINE 100 UNIT/ML [place] SOLN Subcutaneous Inject 50 Units into the skin at bedtime.     Marland Kitchen LEVETIRACETAM 500 MG PO TABS Oral Take 500 mg by mouth 3 (three) times  daily.      Marland Kitchen LISINOPRIL-HYDROCHLOROTHIAZIDE 20-12.5 MG PO TABS Oral Take 1 tablet by mouth daily.      Marland Kitchen METFORMIN HCL 1000 MG PO TABS Oral Take 1,000 mg by mouth 2 (two) times daily.        BP 154/112  Pulse 72  Temp(Src) 98.2 F (36.8 C) (Oral)  Resp 18  Ht 5\' 7"  (1.702 m)  Wt 210 lb (95.255 kg)  BMI 32.89 kg/m2  SpO2 97%  Physical Exam  Constitutional: He is oriented to person, place, and time. He appears well-developed.  HENT:  Head: Normocephalic and atraumatic.  Eyes: Conjunctivae and EOM are normal. No scleral icterus.  Neck: Neck supple. No thyromegaly present.  Cardiovascular: Normal rate and regular rhythm.  Exam reveals no gallop and no friction rub.   No murmur heard. Pulmonary/Chest: No stridor. He has no wheezes. He has no rales. He exhibits no tenderness.  Abdominal: He exhibits no distension. There is no tenderness. There is no rebound.  Musculoskeletal: Normal range of motion. He exhibits no edema.  Lymphadenopathy:    He has no cervical adenopathy.  Neurological: He is oriented to person, place, and time. Coordination normal.  Skin: No rash noted. No erythema.  Psychiatric: He has a normal mood and affect. His behavior is normal.    ED Course  Procedures (including critical care time)  Labs Reviewed - No data to display Dg Chest 2 View  07/03/2011  *RADIOLOGY REPORT*  Clinical Data: Cough and shortness of breath.  CHEST - 2 VIEW  Comparison: 06/30/2011  Findings: Two views of the chest demonstrate clear lungs. Heart and mediastinum are within normal limits.  Trachea is midline.  No evidence for pleural effusions.  Bony structures are intact.  IMPRESSION: No acute chest findings.  Original Report Authenticated By: Richarda Overlie, M.D.     1. Bronchitis     Date: 07/03/2011  Rate:55  Rhythm: normal sinus rhythm  QRS Axis: normal   Except lad  Intervals: normal  ST/T Wave abnormalities: nonspecific ST changes  Conduction Disutrbances:none  Narrative  Interpretation:   Old EKG Reviewed: unchanged  From previous ekg     MDM          Benny Lennert, MD 07/03/11 1503  Benny Lennert, MD 08/09/11 1547

## 2011-07-05 ENCOUNTER — Encounter (HOSPITAL_COMMUNITY): Payer: Self-pay | Admitting: *Deleted

## 2011-07-05 ENCOUNTER — Other Ambulatory Visit: Payer: Self-pay

## 2011-07-05 ENCOUNTER — Emergency Department (HOSPITAL_COMMUNITY)
Admission: EM | Admit: 2011-07-05 | Discharge: 2011-07-05 | Disposition: A | Discharge status: Home / Self Care | Payer: Medicaid Other | Attending: Emergency Medicine | Admitting: Emergency Medicine

## 2011-07-05 ENCOUNTER — Other Ambulatory Visit (HOSPITAL_COMMUNITY): Payer: Self-pay | Admitting: "Endocrinology

## 2011-07-05 DIAGNOSIS — R569 Unspecified convulsions: Secondary | ICD-10-CM

## 2011-07-05 DIAGNOSIS — R51 Headache: Secondary | ICD-10-CM | POA: Insufficient documentation

## 2011-07-05 DIAGNOSIS — Z794 Long term (current) use of insulin: Secondary | ICD-10-CM | POA: Insufficient documentation

## 2011-07-05 DIAGNOSIS — Z79899 Other long term (current) drug therapy: Secondary | ICD-10-CM | POA: Insufficient documentation

## 2011-07-05 DIAGNOSIS — F319 Bipolar disorder, unspecified: Secondary | ICD-10-CM | POA: Insufficient documentation

## 2011-07-05 DIAGNOSIS — E0590 Thyrotoxicosis, unspecified without thyrotoxic crisis or storm: Secondary | ICD-10-CM

## 2011-07-05 DIAGNOSIS — I10 Essential (primary) hypertension: Secondary | ICD-10-CM | POA: Insufficient documentation

## 2011-07-05 DIAGNOSIS — E119 Type 2 diabetes mellitus without complications: Secondary | ICD-10-CM | POA: Insufficient documentation

## 2011-07-05 DIAGNOSIS — F17200 Nicotine dependence, unspecified, uncomplicated: Secondary | ICD-10-CM | POA: Insufficient documentation

## 2011-07-05 DIAGNOSIS — G40909 Epilepsy, unspecified, not intractable, without status epilepticus: Secondary | ICD-10-CM

## 2011-07-05 DIAGNOSIS — F411 Generalized anxiety disorder: Secondary | ICD-10-CM | POA: Insufficient documentation

## 2011-07-05 HISTORY — DX: Headache: R51

## 2011-07-05 LAB — VALPROIC ACID LEVEL: Valproic Acid Lvl: 84.6 ug/mL (ref 50.0–100.0)

## 2011-07-05 LAB — GLUCOSE, CAPILLARY

## 2011-07-05 MED ORDER — LEVETIRACETAM 500 MG PO TABS
500.0000 mg | ORAL_TABLET | Freq: Once | ORAL | Status: AC
  Administered 2011-07-05: 500 mg via ORAL
  Filled 2011-07-05: qty 1

## 2011-07-05 MED ORDER — OXYCODONE-ACETAMINOPHEN 5-325 MG PO TABS
1.0000 | ORAL_TABLET | Freq: Once | ORAL | Status: AC
  Administered 2011-07-05: 1 via ORAL
  Filled 2011-07-05: qty 1
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
## 2011-07-05 NOTE — ED Notes (Signed)
Waiting on discharge

## 2011-07-05 NOTE — ED Provider Notes (Signed)
History     CSN: [id number] Arrival date & time: 07/05/2011 11:37 AM   Chief Complaint  Patient presents with  . Seizures   HPI Pt was seen at 1330.  Per pt, c/o sudden onset and resolution of one episode of "seizure" that occurred PTA.  Pt states he was sitting in a chair and thinks "I had a seizure."  States he woke up incont of urine and "bit my tongue," which "usually happens when I have a seizure."  Thinks his last seizure was approx 1-2 weeks ago.  States he has been taking his depakote and keppra as rx by his Neuro MD.  States he "hasn't been sleeping well" and knows that is a trigger for his seizures.  Denies falling.  Denies fevers, no visual changes, no focal motor weakness, no tingling/numbness in extremities, no N/V/D, no CP/SOB, no abd pain, no back pain.      Neuro:  Dr. Gerilyn Pilgrim Past Medical History  Diagnosis Date  . Seizures   . Anxiety   . Depression   . Diabetes mellitus   . HTN (hypertension)   . Sleep apnea   . Bipolar 1 disorder   . Insomnia   . Headache     Past Surgical History  Procedure Date  . Mandible fracture surgery   . Knee surgery   . Tonsillectomy     Family History  Problem Relation Age of Onset  . Arthritis    . Diabetes    . Asthma      History  Substance Use Topics  . Smoking status: Current Everyday Smoker -- 1.0 packs/day for 30 years    Types: Cigarettes  . Smokeless tobacco: Never Used  . Alcohol Use: No    Review of Systems ROS: Statement: All systems negative except as marked or noted in the HPI; Constitutional: Negative for fever and chills. ; ; Eyes: Negative for eye pain, redness and discharge. ; ; ENMT: Negative for ear pain, hoarseness, nasal congestion, sinus pressure and sore throat. ; ; Cardiovascular: Negative for chest pain, palpitations, diaphoresis, dyspnea and peripheral edema. ; ; Respiratory: Negative for cough, wheezing and stridor. ; ; Gastrointestinal: Negative for nausea, vomiting, diarrhea, abdominal  pain, blood in stool, hematemesis, jaundice and rectal bleeding. . ; ; Genitourinary: Negative for dysuria, flank pain and hematuria. ; ; Musculoskeletal: Negative for back pain and neck pain. Negative for swelling and trauma.; ; Skin: Negative for pruritus, rash, abrasions, blisters, bruising and skin lesion.; ; Neuro: Negative for headache, lightheadedness and neck stiffness. Negative for weakness, altered level of consciousness , altered mental status, extremity weakness, paresthesias, involuntary movement, and syncope.  +seizure.    Allergies  Aspirin  Home Medications   Current Outpatient Rx  Name Route Sig Dispense Refill  . ALBUTEROL SULFATE HFA 108 (90 BASE) MCG/ACT IN AERS Inhalation Inhale 1-2 puffs into the lungs every 6 (six) hours as needed for wheezing. 1 Inhaler 0  . ALPRAZOLAM 2 MG PO TABS Oral Take 2 mg by mouth 4 (four) times daily.     Marland Kitchen AMLODIPINE BESYLATE 10 MG PO TABS Oral Take 10 mg by mouth daily.      . ATENOLOL 100 MG PO TABS Oral Take 100 mg by mouth daily.      . AZITHROMYCIN 250 MG PO TABS Oral Take 1 tablet (250 mg total) by mouth daily. Take one tablet a day 4 tablet 0  . DIFLUNISAL 500 MG PO TABS Oral Take 500 mg by mouth 2 (two)  times daily as needed. Seizures     . DIVALPROEX SODIUM 500 MG PO TBEC Oral Take 500 mg by mouth 3 (three) times daily.      . DULOXETINE HCL 60 MG PO CPEP Oral Take 60 mg by mouth at bedtime.     Marland Kitchen HYDROCODONE-ACETAMINOPHEN 5-325 MG PO TABS  One po q 4-6 hrs prn pain 20 tablet 0  . HYDROCODONE-ACETAMINOPHEN 7.5-325 MG PO TABS Oral Take 1 tablet by mouth every 4 (four) hours as needed. Pain    . IBUPROFEN 800 MG PO TABS Oral Take 800 mg by mouth 2 (two) times daily as needed. FOR PAIN    . INSULIN GLARGINE 100 UNIT/ML [place] SOLN Subcutaneous Inject 50 Units into the skin at bedtime.     Marland Kitchen LEVETIRACETAM 500 MG PO TABS Oral Take 500 mg by mouth 3 (three) times daily.      Marland Kitchen LISINOPRIL-HYDROCHLOROTHIAZIDE 20-12.5 MG PO TABS Oral Take 1 tablet  by mouth daily.      Marland Kitchen METFORMIN HCL 1000 MG PO TABS Oral Take 1,000 mg by mouth 2 (two) times daily.        BP 122/76  Pulse 55  Temp(Src) 98.8 F (37.1 C) (Oral)  Resp 20  Ht 5\' 7"  (1.702 m)  Wt 297 lb (134.718 kg)  BMI 46.52 kg/m2  SpO2 97%  Physical Exam 1335: Physical examination:  Nursing notes reviewed; Vital signs and O2 SAT reviewed;  Constitutional: Well developed, Well nourished, Well hydrated, In no acute distress; Head:  Normocephalic, atraumatic; Eyes: EOMI, PERRL, No scleral icterus; ENMT: Mouth and pharynx normal, Mucous membranes moist; +small area of superficial abrasion right side of tongue, hemostatic; Neck: Supple, Full range of motion, No lymphadenopathy; Cardiovascular: Regular rate and rhythm, No murmur, rub, or gallop; Respiratory: Breath sounds clear & equal bilaterally, No rales, rhonchi, wheezes, or rub, Normal respiratory effort/excursion; Chest: Nontender, Movement normal; Abdomen: Soft, Nontender, Nondistended, Normal bowel sounds; Extremities: Pulses normal, No tenderness, No edema, No calf edema or asymmetry.; Neuro: AA&Ox3, Major CN grossly intact. Speech clear, no facial droop.  No gross focal motor or sensory deficits in extremities.; Skin: Color normal, Warm, Dry, no rash, no petechiae.    ED Course  Procedures   MDM  MDM Reviewed: nursing note, vitals and previous chart Reviewed previous: labs Interpretation: labs   Results for orders placed during the hospital encounter of 07/05/11  GLUCOSE, CAPILLARY      Component Value Range   Glucose-Capillary 122 (*) 70 - 99 (mg/dL)   Comment 1 Notify RN    VALPROIC ACID LEVEL      Component Value Range   Valproic Acid Lvl 84.6  50.0 - 100.0 (ug/mL)    3:21 PM:  Requesting pain meds for "the headache I get after my seizure."  Describes as his usual chronic headache.  Requesting "somthing stronger than what I take at home,"  Appears he takes vicodin.  Will dose percocet.   1455:  T/C to Neuro Dr.  Gerilyn Pilgrim, case discussed, including:  HPI, pertinent PM/SHx, VS/PE, dx testing, ED course and treatment.  Agreeable to f/u in office.  Requests to increase pt's keppra by one dose per day.  His office notes have pt supposed to be taking 1000mg  BID, but pt states he is taking 500mg  qam and 1000mg  qpm.  Will have pt increase to 1000mg  qam and qpm, and will give extra dose now.  Pt feels back to his baseline now and wants to go home.  AAOx3, resps  easy, neuro exam intact, tol PO well.  Dx testing, as well as d/w Neuro MD, d/w pt.  Questions answered.  Verb understanding, agreeable to d/c home with outpt f/u.       Hady Niemczyk Allison Quarry, DO 07/07/11 0111

## 2011-07-05 NOTE — ED Notes (Signed)
Placed seizure pads on bed. 

## 2011-07-07 ENCOUNTER — Ambulatory Visit (HOSPITAL_COMMUNITY): Payer: Medicaid Other

## 2011-07-07 ENCOUNTER — Encounter (HOSPITAL_COMMUNITY)
Admission: RE | Admit: 2011-07-07 | Discharge: 2011-07-07 | Disposition: A | Discharge status: Home / Self Care | Payer: Medicaid Other | Source: Ambulatory Visit | Attending: "Endocrinology | Admitting: "Endocrinology

## 2011-07-07 DIAGNOSIS — E0590 Thyrotoxicosis, unspecified without thyrotoxic crisis or storm: Secondary | ICD-10-CM | POA: Insufficient documentation

## 2011-07-07 MED ORDER — SODIUM IODIDE I 131 CAPSULE
10.0000 | Freq: Once | INTRAVENOUS | Status: AC | PRN
  Administered 2011-07-07: 14 via ORAL

## 2011-07-08 ENCOUNTER — Encounter (HOSPITAL_COMMUNITY)
Admission: RE | Admit: 2011-07-08 | Discharge: 2011-07-08 | Disposition: A | Discharge status: Home / Self Care | Payer: Medicaid Other | Source: Ambulatory Visit | Attending: "Endocrinology | Admitting: "Endocrinology

## 2011-07-08 MED ORDER — SODIUM PERTECHNETATE TC 99M INJECTION
10.0000 | Freq: Once | INTRAVENOUS | Status: AC | PRN
  Administered 2011-07-08: 10 via INTRAVENOUS

## 2011-08-30 ENCOUNTER — Encounter (HOSPITAL_COMMUNITY): Payer: Self-pay | Admitting: *Deleted

## 2011-08-30 ENCOUNTER — Emergency Department (HOSPITAL_COMMUNITY)
Admission: EM | Admit: 2011-08-30 | Discharge: 2011-08-31 | Disposition: A | Discharge status: Home / Self Care | Payer: Medicaid Other | Attending: Emergency Medicine | Admitting: Emergency Medicine

## 2011-08-30 DIAGNOSIS — F1410 Cocaine abuse, uncomplicated: Secondary | ICD-10-CM | POA: Insufficient documentation

## 2011-08-30 DIAGNOSIS — R569 Unspecified convulsions: Secondary | ICD-10-CM | POA: Insufficient documentation

## 2011-08-30 DIAGNOSIS — R739 Hyperglycemia, unspecified: Secondary | ICD-10-CM

## 2011-08-30 DIAGNOSIS — Z79899 Other long term (current) drug therapy: Secondary | ICD-10-CM | POA: Insufficient documentation

## 2011-08-30 DIAGNOSIS — I10 Essential (primary) hypertension: Secondary | ICD-10-CM | POA: Insufficient documentation

## 2011-08-30 DIAGNOSIS — F319 Bipolar disorder, unspecified: Secondary | ICD-10-CM | POA: Insufficient documentation

## 2011-08-30 DIAGNOSIS — Z794 Long term (current) use of insulin: Secondary | ICD-10-CM | POA: Insufficient documentation

## 2011-08-30 DIAGNOSIS — G4700 Insomnia, unspecified: Secondary | ICD-10-CM | POA: Insufficient documentation

## 2011-08-30 DIAGNOSIS — R441 Visual hallucinations: Secondary | ICD-10-CM

## 2011-08-30 DIAGNOSIS — E119 Type 2 diabetes mellitus without complications: Secondary | ICD-10-CM | POA: Insufficient documentation

## 2011-08-30 DIAGNOSIS — R51 Headache: Secondary | ICD-10-CM | POA: Insufficient documentation

## 2011-08-30 DIAGNOSIS — H5316 Psychophysical visual disturbances: Secondary | ICD-10-CM | POA: Insufficient documentation

## 2011-08-30 DIAGNOSIS — F411 Generalized anxiety disorder: Secondary | ICD-10-CM | POA: Insufficient documentation

## 2011-08-30 DIAGNOSIS — F29 Unspecified psychosis not due to a substance or known physiological condition: Secondary | ICD-10-CM | POA: Insufficient documentation

## 2011-08-30 LAB — RAPID URINE DRUG SCREEN, HOSP PERFORMED
Amphetamines: NOT DETECTED
Barbiturates: NOT DETECTED
Cocaine: POSITIVE — AB
Opiates: NOT DETECTED
Tetrahydrocannabinol: POSITIVE — AB

## 2011-08-30 LAB — CBC
Hemoglobin: 14.3 g/dL (ref 13.0–17.0)
MCH: 30.5 pg (ref 26.0–34.0)
MCV: 85.5 fL (ref 78.0–100.0)
RBC: 4.69 MIL/uL (ref 4.22–5.81)
WBC: 7 10*3/uL (ref 4.0–10.5)

## 2011-08-30 LAB — BASIC METABOLIC PANEL
CO2: 26 mEq/L (ref 19–32)
Calcium: 8.7 mg/dL (ref 8.4–10.5)
Chloride: 99 mEq/L (ref 96–112)
Glucose, Bld: 336 mg/dL — ABNORMAL HIGH (ref 70–99)
Sodium: 135 mEq/L (ref 135–145)

## 2011-08-30 MED ORDER — DIVALPROEX SODIUM 250 MG PO DR TAB: 2000.0000 mg | DELAYED_RELEASE_TABLET | Freq: Every day | ORAL | Status: DC

## 2011-08-30 MED ORDER — ATENOLOL 25 MG PO TABS
100.0000 mg | ORAL_TABLET | Freq: Every day | ORAL | Status: DC
  Administered 2011-08-31: 100 mg via ORAL
  Filled 2011-08-30: qty 4
  Filled 2011-08-30: qty 1

## 2011-08-30 MED ORDER — TRAZODONE HCL 100 MG PO TABS
100.0000 mg | ORAL_TABLET | Freq: Every day | ORAL | Status: DC
  Filled 2011-08-30: qty 1

## 2011-08-30 MED ORDER — LISINOPRIL-HYDROCHLOROTHIAZIDE 20-25 MG PO TABS: 1.0000 | ORAL_TABLET | Freq: Every day | ORAL | Status: DC

## 2011-08-30 MED ORDER — ACETAMINOPHEN 325 MG PO TABS: 650.0000 mg | ORAL_TABLET | ORAL | Status: DC | PRN

## 2011-08-30 MED ORDER — IBUPROFEN 400 MG PO TABS: 400.0000 mg | ORAL_TABLET | Freq: Three times a day (TID) | ORAL | Status: DC | PRN

## 2011-08-30 MED ORDER — NICOTINE 21 MG/24HR TD PT24: 21.0000 mg | MEDICATED_PATCH | Freq: Every day | TRANSDERMAL | Status: DC | PRN

## 2011-08-30 MED ORDER — ZOLPIDEM TARTRATE 5 MG PO TABS
5.0000 mg | ORAL_TABLET | Freq: Every evening | ORAL | Status: DC | PRN
  Filled 2011-08-30: qty 1

## 2011-08-30 MED ORDER — LORAZEPAM 1 MG PO TABS
1.0000 mg | ORAL_TABLET | Freq: Three times a day (TID) | ORAL | Status: DC | PRN
  Administered 2011-08-31: 1 mg via ORAL
  Filled 2011-08-30: qty 1

## 2011-08-30 MED ORDER — LEVETIRACETAM 500 MG PO TABS
1000.0000 mg | ORAL_TABLET | Freq: Two times a day (BID) | ORAL | Status: DC
  Administered 2011-08-31 (×2): 1000 mg via ORAL
  Filled 2011-08-30 (×4): qty 2

## 2011-08-30 MED ORDER — METFORMIN HCL 500 MG PO TABS
1000.0000 mg | ORAL_TABLET | Freq: Two times a day (BID) | ORAL | Status: DC
  Administered 2011-08-31 (×2): 1000 mg via ORAL
  Filled 2011-08-30 (×2): qty 2

## 2011-08-30 MED ORDER — DULOXETINE HCL 60 MG PO CPEP
60.0000 mg | ORAL_CAPSULE | Freq: Every day | ORAL | Status: DC
  Filled 2011-08-30: qty 1

## 2011-08-30 MED ORDER — ALBUTEROL SULFATE HFA 108 (90 BASE) MCG/ACT IN AERS
1.0000 | INHALATION_SPRAY | Freq: Four times a day (QID) | RESPIRATORY_TRACT | Status: DC | PRN
  Filled 2011-08-30: qty 6.7

## 2011-08-30 MED ORDER — AMLODIPINE BESYLATE 5 MG PO TABS
10.0000 mg | ORAL_TABLET | Freq: Every day | ORAL | Status: DC
  Administered 2011-08-31: 10 mg via ORAL
  Filled 2011-08-30: qty 2

## 2011-08-30 MED ORDER — INSULIN GLARGINE 100 UNIT/ML ~~LOC~~ SOLN: 50.0000 [IU] | Freq: Every day | SUBCUTANEOUS | Status: DC

## 2011-08-30 MED ORDER — ALUM & MAG HYDROXIDE-SIMETH 200-200-20 MG/5ML PO SUSP: 30.0000 mL | ORAL | Status: DC | PRN

## 2011-08-30 MED ORDER — ONDANSETRON HCL 4 MG PO TABS: 4.0000 mg | ORAL_TABLET | Freq: Three times a day (TID) | ORAL | Status: DC | PRN
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
## 2011-08-30 NOTE — ED Notes (Signed)
Patient removing personal clothing, and putting on scrubs provided by department.

## 2011-08-31 LAB — GLUCOSE, CAPILLARY

## 2011-08-31 MED ORDER — HYDROCHLOROTHIAZIDE 25 MG PO TABS
25.0000 mg | ORAL_TABLET | Freq: Every day | ORAL | Status: DC
  Administered 2011-08-31: 25 mg via ORAL
  Filled 2011-08-31 (×2): qty 1

## 2011-08-31 MED ORDER — HYDROCODONE-ACETAMINOPHEN 5-325 MG PO TABS
1.0000 | ORAL_TABLET | Freq: Once | ORAL | Status: AC
  Administered 2011-08-31: 1 via ORAL
  Filled 2011-08-31: qty 1

## 2011-08-31 MED ORDER — LISINOPRIL 20 MG PO TABS
20.0000 mg | ORAL_TABLET | Freq: Every day | ORAL | Status: DC
  Administered 2011-08-31: 20 mg via ORAL
  Filled 2011-08-31 (×2): qty 1

## 2011-08-31 MED ORDER — LEVETIRACETAM 500 MG PO TABS
ORAL_TABLET | ORAL | Status: AC
  Filled 2011-08-31: qty 2

## 2011-08-31 NOTE — ED Notes (Signed)
Tommy from ACT team here to evaluate patient.

## 2011-08-31 NOTE — BH Assessment (Signed)
Assessment Note   Melvin Taylor is an 46 y.o. male.  The patient came to the ED accompanied by [employer]. He had become psychotic at home, he was seeing a shadow and then the shadow threw him against the wall. This caused him to have a headache which he says sometimes causes him to have seizures. He stated that he has been taking his medications as prescribed and he has been attending the [facility] through [employer]. This morning he denies any delusions. He denies any hallucinations. He denies any SI or HI. He is alert and oriented. He wants to go home. He does  Request something for his headache. He will return to the [facility] 09/01/11. He states he likes to attend this program.  Axis I: Psychotic Disorder NOS Axis II: Deferred Axis III:  Past Medical History  Diagnosis Date  . Seizures   . Anxiety   . Depression   . Diabetes mellitus   . HTN (hypertension)   . Sleep apnea   . Bipolar 1 disorder   . Insomnia   . Headache    Axis IV: other psychosocial or environmental problems Axis V: 41-50 serious symptoms  Past Medical History:  Past Medical History  Diagnosis Date  . Seizures   . Anxiety   . Depression   . Diabetes mellitus   . HTN (hypertension)   . Sleep apnea   . Bipolar 1 disorder   . Insomnia   . Headache     Past Surgical History  Procedure Date  . Mandible fracture surgery   . Knee surgery   . Tonsillectomy     Family History:  Family History  Problem Relation Age of Onset  . Arthritis    . Diabetes    . Asthma      Social History:  reports that he has been smoking Cigarettes.  He has a 30 pack-year smoking history. He has never used smokeless tobacco. He reports that he does not drink alcohol or use illicit drugs.  Additional Social History:    Allergies:  Allergies  Allergen Reactions  . Aspirin Swelling    Home Medications:  Medications Prior to Admission  Medication Dose Route Frequency Provider Last Rate Last Dose  . acetaminophen  (TYLENOL) tablet 650 mg  650 mg Oral Q4H PRN Laray Anger, DO      . albuterol (PROVENTIL HFA;VENTOLIN HFA) 108 (90 BASE) MCG/ACT inhaler 1-2 puff  1-2 puff Inhalation Q6H PRN Laray Anger, DO      . alum & mag hydroxide-simeth (MAALOX/MYLANTA) 200-200-20 MG/5ML suspension 30 mL  30 mL Oral PRN Laray Anger, DO      . amLODipine (NORVASC) tablet 10 mg  10 mg Oral Daily Laray Anger, DO   10 mg at 08/31/11 0981  . atenolol (TENORMIN) tablet 100 mg  100 mg Oral Daily Laray Anger, DO   100 mg at 08/31/11 0919  . divalproex (DEPAKOTE) DR tablet 2,000 mg  2,000 mg Oral QHS Laray Anger, DO      . DULoxetine (CYMBALTA) DR capsule 60 mg  60 mg Oral QHS Laray Anger, DO      . hydrochlorothiazide (HYDRODIURIL) tablet 25 mg  25 mg Oral Daily Laray Anger, DO   25 mg at 08/31/11 0919  . HYDROcodone-acetaminophen (NORCO) 5-325 MG per tablet 1 tablet  1 tablet Oral Once Laray Anger, DO   1 tablet at 08/31/11 0115  . ibuprofen (ADVIL,MOTRIN) tablet 400  mg  400 mg Oral Q8H PRN Laray Anger, DO      . insulin glargine (LANTUS) injection 50 Units  50 Units Subcutaneous QHS Laray Anger, DO      . levETIRAcetam (KEPPRA) tablet 1,000 mg  1,000 mg Oral BID Laray Anger, DO   1,000 mg at 08/31/11 1610  . lisinopril (PRINIVIL,ZESTRIL) tablet 20 mg  20 mg Oral Daily Laray Anger, DO   20 mg at 08/31/11 9604  . LORazepam (ATIVAN) tablet 1 mg  1 mg Oral Q8H PRN Laray Anger, DO   1 mg at 08/31/11 0109  . metFORMIN (GLUCOPHAGE) tablet 1,000 mg  1,000 mg Oral BID Laray Anger, DO   1,000 mg at 08/31/11 5409  . methylPREDNISolone acetate (DEPO-MEDROL) injection 40 mg  40 mg Intra-articular Once Fuller Canada, MD      . nicotine (NICODERM CQ - dosed in mg/24 hours) patch 21 mg  21 mg Transdermal Daily PRN Laray Anger, DO      . ondansetron [facility]) tablet 4 mg  4 mg Oral Q8H PRN Laray Anger, DO      . traZODone  (DESYREL) tablet 100 mg  100 mg Oral QHS Laray Anger, DO      . zolpidem [facility]) tablet 5 mg  5 mg Oral QHS PRN Laray Anger, DO      . DISCONTD: lisinopril-hydrochlorothiazide (PRINZIDE,ZESTORETIC) 20-25 MG per tablet 1 tablet  1 tablet Oral Daily Laray Anger, DO       Medications Prior to Admission  Medication Sig Dispense Refill  . albuterol (PROVENTIL HFA;VENTOLIN HFA) 108 (90 BASE) MCG/ACT inhaler Inhale 1-2 puffs into the lungs every 6 (six) hours as needed for wheezing.  1 Inhaler  0  . alprazolam (XANAX) 2 MG tablet Take 2 mg by mouth 4 (four) times daily.       Marland Kitchen amLODipine (NORVASC) 10 MG tablet Take 10 mg by mouth daily.        Marland Kitchen atenolol (TENORMIN) 100 MG tablet Take 100 mg by mouth daily.        . divalproex (DEPAKOTE) 500 MG EC tablet Take 2,000 mg by mouth at bedtime.       . DULoxetine (CYMBALTA) 60 MG capsule Take 60 mg by mouth at bedtime.       Marland Kitchen HYDROcodone-acetaminophen (NORCO) 5-325 MG per tablet One po q 4-6 hrs prn pain  20 tablet  0  . ibuprofen (ADVIL,MOTRIN) 800 MG tablet Take 800 mg by mouth 2 (two) times daily as needed. FOR PAIN      . insulin glargine (LANTUS) 100 UNIT/ML injection Inject 50 Units into the skin at bedtime.       . metFORMIN (GLUCOPHAGE) 1000 MG tablet Take 1,000 mg by mouth 2 (two) times daily.        . diflunisal (DOLOBID) 500 MG TABS Take 500 mg by mouth 2 (two) times daily as needed. Seizures         OB/GYN Status:  No LMP for male patient.  General Assessment Data Location of Assessment: [facility] ACT Assessment: Yes Living Arrangements: Alone Can pt return to current living arrangement?: Yes Admission Status: Voluntary Is patient capable of signing voluntary admission?: Yes Transfer from: Acute Hospital Referral Source: MD  Education Status Is patient currently in school?: No  Risk to self Suicidal Ideation: No Suicidal Intent: No Is patient at risk for suicide?: No Suicidal Plan?: No Access to Means:  No What  has been your use of drugs/alcohol within the last 12 months?: denies Previous Attempts/Gestures: Yes How many times?: 3  Other Self Harm Risks: denies Triggers for Past Attempts: Unknown Intentional Self Injurious Behavior: None Family Suicide History: No Recent stressful life event(s): Other (Comment) (denies) Persecutory voices/beliefs?: No Depression: No Depression Symptoms:  (denies) Substance abuse history and/or treatment for substance abuse?: No Suicide prevention information given to non-admitted patients: Yes  Risk to Others Homicidal Ideation: No Thoughts of Harm to Others: No Current Homicidal Intent: No Current Homicidal Plan: No Access to Homicidal Means: No Identified Victim: na History of harm to others?: No Assessment of Violence: None Noted Violent Behavior Description: na Does patient have access to weapons?: No Criminal Charges Pending?: No Does patient have a court date: No  Psychosis Hallucinations: None noted (was seeing and feeling a shadow 08/30/11) Delusions: None noted  Mental Status Report Appear/Hygiene: Disheveled Eye Contact: Fair Motor Activity: Unremarkable Speech: Logical/coherent Level of Consciousness: Quiet/awake Mood: Depressed Affect: Blunted Anxiety Level: None Thought Processes: Coherent Judgement: Unimpaired Orientation: Person;Place;Time;Situation Obsessive Compulsive Thoughts/Behaviors: None  Cognitive Functioning Concentration: Decreased Memory: Recent Intact;Remote Intact IQ: Average Insight: Fair Impulse Control: Good Appetite: Good Sleep: No Change Vegetative Symptoms: None  Prior Inpatient Therapy Prior Inpatient Therapy: Yes Prior Therapy Dates:   Prior Therapy Facilty/Provider([facility]): [facility] Reason for Treatment: psychosis;SI  Prior Outpatient Therapy Prior Outpatient Therapy: Yes Prior Therapy Dates: 2012;2013 Prior Therapy Facilty/Provider(s): [facility] Reason for  Treatment: aftercare            Values / Beliefs Cultural Requests During Hospitalization: None Spiritual Requests During Hospitalization: None        Additional Information 1:1 In Past 12 Months?: No CIRT Risk: No Elopement Risk: No Does patient have medical clearance?: Yes     Disposition:The patient will resume follow up through [facility]. He will continue to live in the [facility] apartments. He has transportation daily to [employer]. Dr. Lynelle Doctor is in agreement with this disposition. Disposition Disposition of Patient: Outpatient treatment Type of outpatient treatment: Adult;Psych Intensive Outpatient  On Site Evaluation by:   Reviewed with Physician:     Jearld Pies 08/31/2011 9:45 AM

## 2011-08-31 NOTE — ED Notes (Signed)
Patient resting comfortably in bed with eyes closed.  Equal rise and fall of chest noted.  Patient with deep, even respirations.

## 2011-08-31 NOTE — ED Provider Notes (Signed)
History     CSN: [id number]  Arrival date & time 08/30/11  2138     Chief Complaint  Patient presents with  . Hallucinations   The history is provided by the patient and the police. History Limited By: hallucinations.   Pt was seen at 2330.   Per Police and pt, pt c/o seeing "a shadow" when he was sitting at home "doing paperwork at my desk."  States this "shadow" grabbed him, threw him on the bed," and "wrestled" with him.  States he began to pray and "the shadow started to slowly disappear."  States he ran out of the house, called [facility] and his counselor who called Police to bring pt to the ED for eval.  Pt states this has happened to him previously, usually resulting in a change of his psych meds.  Denies SI/HI.      Past Medical History  Diagnosis Date  . Seizures   . Anxiety   . Depression   . Diabetes mellitus   . HTN (hypertension)   . Sleep apnea   . Bipolar 1 disorder   . Insomnia   . Headache     Past Surgical History  Procedure Date  . Mandible fracture surgery   . Knee surgery   . Tonsillectomy     Family History  Problem Relation Age of Onset  . Arthritis    . Diabetes    . Asthma      History  Substance Use Topics  . Smoking status: Current Everyday Smoker -- 1.0 packs/day for 30 years    Types: Cigarettes  . Smokeless tobacco: Never Used  . Alcohol Use: No      Review of Systems  Unable to perform ROS: Psychiatric disorder    Allergies  Aspirin  Home Medications   Current Outpatient Rx  Name Route Sig Dispense Refill  . ALBUTEROL SULFATE HFA 108 (90 BASE) MCG/ACT IN AERS Inhalation Inhale 1-2 puffs into the lungs every 6 (six) hours as needed for wheezing. 1 Inhaler 0  . ALPRAZOLAM 2 MG PO TABS Oral Take 2 mg by mouth 4 (four) times daily.     Marland Kitchen AMLODIPINE BESYLATE 10 MG PO TABS Oral Take 10 mg by mouth daily.      . ATENOLOL 100 MG PO TABS Oral Take 100 mg by mouth daily.      Marland Kitchen CARISOPRODOL 350 MG PO TABS Oral Take 350 mg  by mouth 3 (three) times daily.    Marland Kitchen DIVALPROEX SODIUM 500 MG PO TBEC Oral Take 2,000 mg by mouth at bedtime.     . DULOXETINE HCL 60 MG PO CPEP Oral Take 60 mg by mouth at bedtime.     Marland Kitchen HYDROCODONE-ACETAMINOPHEN 5-325 MG PO TABS  One po q 4-6 hrs prn pain 20 tablet 0  . IBUPROFEN 800 MG PO TABS Oral Take 800 mg by mouth 2 (two) times daily as needed. FOR PAIN    . INSULIN GLARGINE 100 UNIT/ML [place] SOLN Subcutaneous Inject 50 Units into the skin at bedtime.     Marland Kitchen LISINOPRIL-HYDROCHLOROTHIAZIDE 20-25 MG PO TABS Oral Take 1 tablet by mouth daily.    Marland Kitchen METFORMIN HCL 1000 MG PO TABS Oral Take 1,000 mg by mouth 2 (two) times daily.      . TRAZODONE HCL 100 MG PO TABS Oral Take 100 mg by mouth at bedtime.    Marland Kitchen DIFLUNISAL 500 MG PO TABS Oral Take 500 mg by mouth 2 (two) times daily as  needed. Seizures       BP 145/102  Pulse 99  Temp 98.3 F (36.8 C)  Resp 18  Ht 5\' 6"  (1.676 m)  Wt 200 lb (90.719 kg)  BMI 32.28 kg/m2  SpO2 98%  Physical Exam 2335: Physical examination:  Nursing notes reviewed; Vital signs and O2 SAT reviewed;  Constitutional: Well developed, Well nourished, Well hydrated, In no acute distress; Head:  Normocephalic, atraumatic; Eyes: EOMI, PERRL, No scleral icterus; ENMT: Mouth and pharynx normal, Mucous membranes moist; Neck: Supple, Full range of motion, No lymphadenopathy; Cardiovascular: Regular rate and rhythm, No murmur, rub, or gallop; Respiratory: Breath sounds clear & equal bilaterally, No rales, rhonchi, wheezes, or rub, Normal respiratory effort/excursion; Chest: Nontender, Movement normal; Abdomen: Soft, Nontender, Nondistended, Normal bowel sounds; Extremities: Pulses normal, No tenderness, No edema, No calf edema or asymmetry.; Neuro: AA&Ox3, Major CN grossly intact.  No gross focal motor or sensory deficits in extremities.; Skin: Color normal, Warm, Dry, Psych:  Affect flat, poor eye contact, +hallucinations.     ED Course  Procedures   2330:  [facility] Ella requests  to hold pt in ED overnight for re-eval in morning due to no 400 hall beds at [facility] available tonight as well as pt endorses that he has his own psych MD appt "sometime tomorrow."     2355:  Pt's CBG elevated per his hx of DM, not acidotic.  Pt states he usually takes his DM meds at night and did not take them tonight.  Will dose pt's usual DM meds.  Holding orders written.  1:14 AM:  Pt requesting "a vicodin" for his chronic headache.  Apparently refused to take tylenol and ibuprofen.  Will dose one now.     MDM  MDM Reviewed: previous chart and nursing note Reviewed previous: labs Interpretation: labs   Results for orders placed during the hospital encounter of 08/30/11  GLUCOSE, CAPILLARY      Component Value Range   Glucose-Capillary 319 (*) 70 - 99 (mg/dL)  CBC      Component Value Range   WBC 7.0  4.0 - 10.5 (K/uL)   RBC 4.69  4.22 - 5.81 (MIL/uL)   Hemoglobin 14.3  13.0 - 17.0 (g/dL)   HCT 16.1  09.6 - 04.5 (%)   MCV 85.5  78.0 - 100.0 (fL)   MCH 30.5  26.0 - 34.0 (pg)   MCHC 35.7  30.0 - 36.0 (g/dL)   RDW 40.9  81.1 - 91.4 (%)   Platelets 171  150 - 400 (K/uL)  BASIC METABOLIC PANEL      Component Value Range   Sodium 135  135 - 145 (mEq/L)   Potassium 3.7  3.5 - 5.1 (mEq/L)   Chloride 99  96 - 112 (mEq/L)   CO2 26  19 - 32 (mEq/L)   Glucose, Bld 336 (*) 70 - 99 (mg/dL)   BUN 13  6 - 23 (mg/dL)   Creatinine, Ser 7.82  0.50 - 1.35 (mg/dL)   Calcium 8.7  8.4 - 95.6 (mg/dL)   GFR calc non Af Amer >90  >90 (mL/min)   GFR calc Af Amer >90  >90 (mL/min)  URINE RAPID DRUG SCREEN (HOSP PERFORMED)      Component Value Range   Opiates NONE DETECTED  NONE DETECTED    Cocaine POSITIVE (*) NONE DETECTED    Benzodiazepines NONE DETECTED  NONE DETECTED    Amphetamines NONE DETECTED  NONE DETECTED    Tetrahydrocannabinol POSITIVE (*) NONE DETECTED  Barbiturates NONE DETECTED  NONE DETECTED   ETHANOL      Component Value Range   Alcohol, Ethyl (B) <11  0 - 11 (mg/dL)    GLUCOSE, CAPILLARY      Component Value Range   Glucose-Capillary 255 (*) 70 - 99 (mg/dL)   Comment 1 Notify RN       7:21 AM:   Pt has been calm/cooperative overnight.  Will need [facility] eval this morning.  Sign out to Dr. Lynelle Doctor.         Vikram Tillett Allison Quarry, DO 09/02/11 0900

## 2011-08-31 NOTE — ED Notes (Signed)
Pt resting quietly with eyes closed.  Respirations regular, even and unlabored.  No distress noted.  

## 2011-08-31 NOTE — ED Notes (Signed)
Patient is wondering when he can go home.

## 2011-08-31 NOTE — ED Provider Notes (Signed)
She came in last night for having hallucinations are shadows that attacked him. He relates that he used cocaine last one to 2 weeks ago when confronted about his urine drug screen he states will maybe it was days ago. She has been evaluated by [place] Fus from [facility] and patient relates his hallucinations are gone. Patient goes to a day camp 5 days a week and will have close supervision. Patient agreeable to going home at this time. Patient states he just feels tired.   Diagnoses that have been ruled out:  None  Diagnoses that are still under consideration:  None  Final diagnoses:  Visual hallucinations  Cocaine abuse  Hyperglycemia   Plan discharge  Devoria Albe, MD, Franz Dell, MD 08/31/11 [phone]

## 2012-02-20 ENCOUNTER — Emergency Department (HOSPITAL_COMMUNITY)
Admission: EM | Admit: 2012-02-20 | Discharge: 2012-02-20 | Disposition: A | Discharge status: Home / Self Care | Payer: Medicaid Other | Attending: Emergency Medicine | Admitting: Emergency Medicine

## 2012-02-20 ENCOUNTER — Emergency Department (HOSPITAL_COMMUNITY): Payer: Medicaid Other

## 2012-02-20 ENCOUNTER — Other Ambulatory Visit: Payer: Self-pay

## 2012-02-20 DIAGNOSIS — M542 Cervicalgia: Secondary | ICD-10-CM | POA: Insufficient documentation

## 2012-02-20 DIAGNOSIS — R569 Unspecified convulsions: Secondary | ICD-10-CM | POA: Insufficient documentation

## 2012-02-20 DIAGNOSIS — S0083XA Contusion of other part of head, initial encounter: Secondary | ICD-10-CM

## 2012-02-20 DIAGNOSIS — W108XXA Fall (on) (from) other stairs and steps, initial encounter: Secondary | ICD-10-CM | POA: Insufficient documentation

## 2012-02-20 DIAGNOSIS — G8929 Other chronic pain: Secondary | ICD-10-CM | POA: Insufficient documentation

## 2012-02-20 DIAGNOSIS — Y92009 Unspecified place in unspecified non-institutional (private) residence as the place of occurrence of the external cause: Secondary | ICD-10-CM | POA: Insufficient documentation

## 2012-02-20 DIAGNOSIS — S1093XA Contusion of unspecified part of neck, initial encounter: Secondary | ICD-10-CM | POA: Insufficient documentation

## 2012-02-20 DIAGNOSIS — F1410 Cocaine abuse, uncomplicated: Secondary | ICD-10-CM | POA: Insufficient documentation

## 2012-02-20 DIAGNOSIS — S0003XA Contusion of scalp, initial encounter: Secondary | ICD-10-CM | POA: Insufficient documentation

## 2012-02-20 DIAGNOSIS — G4730 Sleep apnea, unspecified: Secondary | ICD-10-CM | POA: Insufficient documentation

## 2012-02-20 LAB — VALPROIC ACID LEVEL: Valproic Acid Lvl: <10 ug/mL — ABNORMAL LOW (ref 50.0–100.0)

## 2012-02-20 LAB — RAPID URINE DRUG SCREEN, HOSP PERFORMED: Amphetamines: NOT DETECTED

## 2012-02-20 MED ORDER — IOHEXOL 300 MG/ML  SOLN
100.0000 mL | Freq: Once | INTRAMUSCULAR | Status: AC | PRN
  Administered 2012-02-20: 100 mL via INTRAVENOUS

## 2012-02-20 MED FILL — Lorazepam Inj 2 MG/ML: INTRAMUSCULAR | Qty: 1 | Status: AC

## 2012-02-20 NOTE — ED Notes (Signed)
Please refer to downtime documentation for prior time frame.

## 2012-02-20 NOTE — ED Notes (Signed)
Patient states he does not need anything at this time. 

## 2012-02-20 NOTE — ED Notes (Signed)
Family at bedside. 

## 2012-03-07 DIAGNOSIS — R51 Headache: Secondary | ICD-10-CM

## 2012-03-13 ENCOUNTER — Other Ambulatory Visit (HOSPITAL_COMMUNITY): Payer: Self-pay | Admitting: Internal Medicine

## 2012-03-13 ENCOUNTER — Ambulatory Visit (HOSPITAL_COMMUNITY)
Admission: RE | Admit: 2012-03-13 | Discharge: 2012-03-13 | Disposition: A | Discharge status: Home / Self Care | Payer: Medicaid Other | Source: Ambulatory Visit | Attending: Internal Medicine | Admitting: Internal Medicine

## 2012-03-13 DIAGNOSIS — M549 Dorsalgia, unspecified: Secondary | ICD-10-CM

## 2012-03-13 DIAGNOSIS — IMO0002 Reserved for concepts with insufficient information to code with codable children: Secondary | ICD-10-CM | POA: Insufficient documentation

## 2012-03-13 DIAGNOSIS — M545 Low back pain, unspecified: Secondary | ICD-10-CM | POA: Insufficient documentation

## 2012-03-13 DIAGNOSIS — W19XXXA Unspecified fall, initial encounter: Secondary | ICD-10-CM | POA: Insufficient documentation

## 2012-04-06 ENCOUNTER — Emergency Department (HOSPITAL_COMMUNITY)
Admission: EM | Admit: 2012-04-06 | Discharge: 2012-04-06 | Disposition: A | Discharge status: Home / Self Care | Payer: Medicaid Other | Attending: Emergency Medicine | Admitting: Emergency Medicine

## 2012-04-06 ENCOUNTER — Encounter (HOSPITAL_COMMUNITY): Payer: Self-pay | Admitting: Emergency Medicine

## 2012-04-06 ENCOUNTER — Emergency Department (HOSPITAL_COMMUNITY)
Admission: EM | Admit: 2012-04-06 | Discharge: 2012-04-06 | Disposition: A | Discharge status: Home / Self Care | Payer: Medicaid Other | Source: Home / Self Care | Attending: Emergency Medicine | Admitting: Emergency Medicine

## 2012-04-06 ENCOUNTER — Emergency Department (HOSPITAL_COMMUNITY): Payer: Medicaid Other

## 2012-04-06 ENCOUNTER — Encounter (HOSPITAL_COMMUNITY): Payer: Self-pay | Admitting: *Deleted

## 2012-04-06 DIAGNOSIS — Z79899 Other long term (current) drug therapy: Secondary | ICD-10-CM | POA: Insufficient documentation

## 2012-04-06 DIAGNOSIS — I10 Essential (primary) hypertension: Secondary | ICD-10-CM | POA: Insufficient documentation

## 2012-04-06 DIAGNOSIS — F341 Dysthymic disorder: Secondary | ICD-10-CM | POA: Insufficient documentation

## 2012-04-06 DIAGNOSIS — E119 Type 2 diabetes mellitus without complications: Secondary | ICD-10-CM | POA: Insufficient documentation

## 2012-04-06 DIAGNOSIS — S0990XA Unspecified injury of head, initial encounter: Secondary | ICD-10-CM

## 2012-04-06 DIAGNOSIS — G4730 Sleep apnea, unspecified: Secondary | ICD-10-CM | POA: Insufficient documentation

## 2012-04-06 DIAGNOSIS — R569 Unspecified convulsions: Secondary | ICD-10-CM

## 2012-04-06 DIAGNOSIS — G40909 Epilepsy, unspecified, not intractable, without status epilepticus: Secondary | ICD-10-CM | POA: Insufficient documentation

## 2012-04-06 DIAGNOSIS — F319 Bipolar disorder, unspecified: Secondary | ICD-10-CM | POA: Insufficient documentation

## 2012-04-06 DIAGNOSIS — F17200 Nicotine dependence, unspecified, uncomplicated: Secondary | ICD-10-CM | POA: Insufficient documentation

## 2012-04-06 LAB — GLUCOSE, CAPILLARY: Glucose-Capillary: 192 mg/dL — ABNORMAL HIGH (ref 70–99)

## 2012-04-06 LAB — VALPROIC ACID LEVEL: Valproic Acid Lvl: 47.1 ug/mL — ABNORMAL LOW (ref 50.0–100.0)

## 2012-04-06 MED ORDER — DIVALPROEX SODIUM ER 500 MG PO TB24
1000.0000 mg | ORAL_TABLET | Freq: Once | ORAL | Status: AC
  Administered 2012-04-06: 1000 mg via ORAL
  Filled 2012-04-06: qty 2

## 2012-04-06 MED ORDER — LEVETIRACETAM 500 MG PO TABS
1000.0000 mg | ORAL_TABLET | Freq: Once | ORAL | Status: AC
  Administered 2012-04-06: 1000 mg via ORAL
  Filled 2012-04-06: qty 2

## 2012-04-06 MED ORDER — DIVALPROEX SODIUM ER 250 MG PO TB24
ORAL_TABLET | ORAL | Status: AC
  Filled 2012-04-06: qty 4

## 2012-04-06 MED ORDER — OXYCODONE-ACETAMINOPHEN 5-325 MG PO TABS
1.0000 | ORAL_TABLET | Freq: Once | ORAL | Status: AC
  Administered 2012-04-06: 1 via ORAL
  Filled 2012-04-06: qty 1

## 2012-04-06 MED ORDER — ONDANSETRON 8 MG PO TBDP
8.0000 mg | ORAL_TABLET | Freq: Once | ORAL | Status: AC
  Administered 2012-04-06: 8 mg via ORAL
  Filled 2012-04-06: qty 1

## 2012-04-06 MED ORDER — LEVETIRACETAM 500 MG PO TABS
ORAL_TABLET | ORAL | Status: AC
  Filled 2012-04-06: qty 2

## 2012-04-06 NOTE — ED Provider Notes (Signed)
History    This chart was scribed for Joya Gaskins, MD, MD by Smitty Pluck. The patient was seen in room [facility]18 and the patient's care was started at 12:08PM.   CSN: [id number]  Arrival date & time 04/06/12  1122   First MD Initiated Contact with Patient 04/06/12 1208      Chief Complaint  Patient presents with  . Fall     Patient is a 46 y.o. male presenting with fall. The history is provided by the patient. No language interpreter was used.  Fall Associated symptoms include headaches.   Melvin Taylor is a 46 y.o. male who presents to the Emergency Department with h/o head pain that started yesterday.  He had a seizure yesterday, fell and hit the left side of his head Pt reports that he had LOC. He reports that today he has generalized weakness, moderate headache and left neck pain. He reports that he takes depakote and has been med compliant. Denies visual disturbances, nausea and vomiting.  Pt reports tetanus is UTD.   no focal weakness just feels fatigue No visual changes.  No diplopia.  No facial pain or dental injury reported  Past Medical History  Diagnosis Date  . Seizures   . Anxiety   . Depression   . Diabetes mellitus   . HTN (hypertension)   . Sleep apnea   . Bipolar 1 disorder   . Insomnia   . Headache     Past Surgical History  Procedure Date  . Mandible fracture surgery   . Knee surgery   . Tonsillectomy     Family History  Problem Relation Age of Onset  . Arthritis    . Diabetes    . Asthma      History  Substance Use Topics  . Smoking status: Current Everyday Smoker -- 1.0 packs/day for 30 years    Types: Cigarettes  . Smokeless tobacco: Never Used  . Alcohol Use: No      Review of Systems  HENT: Positive for neck pain.   Neurological: Positive for headaches.  All other systems reviewed and are negative.    Allergies  Aspirin  Home Medications   Current Outpatient Rx  Name Route Sig Dispense Refill  . ALBUTEROL SULFATE  HFA 108 (90 BASE) MCG/ACT IN AERS Inhalation Inhale 1-2 puffs into the lungs every 6 (six) hours as needed for wheezing. 1 Inhaler 0  . ALPRAZOLAM 2 MG PO TABS Oral Take 2 mg by mouth 4 (four) times daily.     Marland Kitchen AMLODIPINE BESYLATE 10 MG PO TABS Oral Take 10 mg by mouth daily.      . ATENOLOL 100 MG PO TABS Oral Take 100 mg by mouth daily.      Marland Kitchen CARISOPRODOL 350 MG PO TABS Oral Take 350 mg by mouth 3 (three) times daily.    Marland Kitchen DIFLUNISAL 500 MG PO TABS Oral Take 500 mg by mouth 2 (two) times daily as needed. Seizures     . DIVALPROEX SODIUM 500 MG PO TBEC Oral Take 2,000 mg by mouth at bedtime.     . DULOXETINE HCL 60 MG PO CPEP Oral Take 60 mg by mouth at bedtime.     Marland Kitchen HYDROCODONE-ACETAMINOPHEN 5-325 MG PO TABS  One po q 4-6 hrs prn pain 20 tablet 0  . IBUPROFEN 800 MG PO TABS Oral Take 800 mg by mouth 2 (two) times daily as needed. FOR PAIN    . INSULIN GLARGINE 100 UNIT/ML [place]  SOLN Subcutaneous Inject 50 Units into the skin at bedtime.     Marland Kitchen LISINOPRIL-HYDROCHLOROTHIAZIDE 20-25 MG PO TABS Oral Take 1 tablet by mouth daily.    Marland Kitchen METFORMIN HCL 1000 MG PO TABS Oral Take 1,000 mg by mouth 2 (two) times daily.      . TRAZODONE HCL 100 MG PO TABS Oral Take 100 mg by mouth at bedtime.      BP 121/89  Pulse 81  Temp 98.6 F (37 C) (Oral)  Resp 16  Ht 5\' 7"  (1.702 m)  Wt 181 lb (82.101 kg)  BMI 28.35 kg/m2  SpO2 96%  Physical Exam  Nursing note and vitals reviewed.  CONSTITUTIONAL: Well developed/well nourished HEAD AND FACE: Normocephalic/atraumatic, abrasion and edema over left forehead and left eyelid. No laceration noted EYES: EOMI/PERRL.  No proptosis.  No hyphema noted.  No tenderness/crepitance to left orbit ENMT: Mucous membranes moist. No septal hematoma.  No nasal injury noted.  No malocclusion.  No dental injury.   NECK: supple no meningeal signs SPINE:entire spine nontender, NEXUS criteria met , No bruising/crepitance/stepoffs noted to spine CV: S1/S2 noted, no  murmurs/rubs/gallops noted LUNGS: Lungs are clear to auscultation bilaterally, no apparent distress ABDOMEN: soft, nontender, no rebound or guarding GU:no cva tenderness NEURO: Pt is awake/alert, moves all extremitiesx4.  No arm or leg drift.  No focal weakness noted EXTREMITIES: pulses normal, full ROM SKIN: warm, color normal PSYCH: no abnormalities of mood noted  ED Course  Procedures DIAGNOSTIC STUDIES: Oxygen Saturation is 96% on room air, normal by my interpretation.    COORDINATION OF CARE: 12:11 PM Discussed pt ED treatment with pt  12:18 PM Ordered:   Medications  oxyCODONE-acetaminophen (PERCOCET/ROXICET) 5-325 MG per tablet 1 tablet (1 tablet Oral Given 04/06/12 1216)  ondansetron (ZOFRAN-ODT) disintegrating tablet 8 mg (8 mg Oral Given 04/06/12 1216)   Plan is to monitor patient in the Ed.  He hit his head yesterday after a seizure( he has h/o seizures) now reporting headache.  He has no vomiting and currently GCS 15.  Will observe in ED and reassess.  At this time will defer imaging  1:50 PM Pt improved.  He reports he feels steady while ambulating.  He has no signs of back/neck/extremity trauma.  He is not vomiting. Given that his fall was yesterday, I doubt he has had a traumatic brain injury.  He is at baseline.  He is is not on anticoagulants.  Pt given strict return precautions    MDM  Nursing notes including past medical history and social history reviewed and considered in documentation Previous records reviewed and considered     I personally performed the services described in this documentation, which was scribed in my presence. The recorded information has been reviewed and considered.      Joya Gaskins, MD 04/06/12 1351

## 2012-04-06 NOTE — ED Notes (Signed)
Pt is noted to be resting, respirations even and unlabored.  Equal chest rise and fall bilaterally.

## 2012-04-06 NOTE — Discharge Instructions (Signed)
Please be aware you may have another seizure  Do not drive until seen by your physician for your condition  Do not climb ladders/roofs/trees as a seizure can occur at that height and cause serious harm  Do not bathe/swim alone as a seizure can occur and cause serious harm  Please followup with your physician or neurologist for further testing and possible treatment   You have had a head injury which does not appear to require admission at this time. A concussion is a state of changed mental ability from trauma.  SEEK IMMEDIATE MEDICAL ATTENTION IF: There is confusion or drowsiness (although children frequently become drowsy after injury).  You cannot awaken the injured person.  There is nausea (feeling sick to your stomach) or continued, forceful vomiting.  You notice dizziness or unsteadiness which is getting worse, or inability to walk.  You have convulsions or unconsciousness.  You experience severe, persistent headaches not relieved by Tylenol. (Do not take aspirin as this impairs clotting abilities). Take other pain medications only as directed.  You cannot use arms or legs normally.  There are changes in pupil sizes. (This is the black center in the colored part of the eye)  There is clear or bloody discharge from the nose or ears.  Change in speech, vision, swallowing, or understanding.  Localized weakness, numbness, tingling, or change in bowel or bladder control.

## 2012-04-06 NOTE — ED Notes (Addendum)
Pt states he is here due to having another seizure an hour ago. Pt also c/o severe headache

## 2012-04-06 NOTE — ED Notes (Signed)
AC informed of need for PO medications from main pharmacy.

## 2012-04-06 NOTE — ED Notes (Signed)
Pt states he had a seizure last night, and fell hitting his head on the table.  Noted bruising to left eye and above eye.  Denies visual disturbance of left eye. Pt states pain 10/10 to head.  Pt states that previous seizure was 2 weeks ago.  NAD noted at this time.

## 2012-04-06 NOTE — ED Notes (Signed)
Patient requesting pain medication. Dr Clarene Duke made aware. Orders obtained.

## 2012-04-06 NOTE — ED Notes (Signed)
Patient transported to CT 

## 2012-04-06 NOTE — ED Notes (Signed)
Pt states he had a seizure last night and fell, hitting his head on the table.

## 2012-04-06 NOTE — ED Notes (Signed)
Patient with no complaints at this time. Respirations even and unlabored. Skin warm/dry. Discharge instructions reviewed with patient at this time. Patient given opportunity to voice concerns/ask questions. IV removed per policy and band-aid applied to site. Patient discharged at this time and left Emergency Department with steady gait.  

## 2012-04-06 NOTE — ED Provider Notes (Signed)
History     CSN: [id number]  Arrival date & time 04/06/12  0865   First MD Initiated Contact with Patient 04/06/12 1944      Chief Complaint  Patient presents with  . Seizures    HPI Pt was seen at 1945.  Per pt, c/o sudden onset and resolution of one episode of seizure that occurred approx 1 to 2 hours PTA. Pt states heas not taken his seizure meds since yesterday.  States he came to the ED earlier today for head "pain" after he hit the left side of his head against a table after having a seizure yesterday.  Pt states the area "still hurts" and is requesting pain meds.  Denies CP/SOB, no abd pain, no N/V/D, no fevers, no neck or back pain, no visual changes, no focal motor weakness, no tingling/numbness in extremities.    Past Medical History  Diagnosis Date  . Seizures   . Anxiety   . Depression   . Diabetes mellitus   . HTN (hypertension)   . Sleep apnea   . Bipolar 1 disorder   . Insomnia   . Headache     Past Surgical History  Procedure Date  . Mandible fracture surgery   . Knee surgery   . Tonsillectomy     Family History  Problem Relation Age of Onset  . Arthritis    . Diabetes    . Asthma      History  Substance Use Topics  . Smoking status: Current Everyday Smoker -- 1.0 packs/day for 30 years    Types: Cigarettes  . Smokeless tobacco: Never Used  . Alcohol Use: No     Review of Systems ROS: Statement: All systems negative except as marked or noted in the HPI; Constitutional: Negative for fever and chills. ; ; Eyes: Negative for eye pain, redness and discharge. ; ; ENMT: Negative for ear pain, hoarseness, nasal congestion, sinus pressure and sore throat. ; ; Cardiovascular: Negative for chest pain, palpitations, diaphoresis, dyspnea and peripheral edema. ; ; Respiratory: Negative for cough, wheezing and stridor. ; ; Gastrointestinal: Negative for nausea, vomiting, diarrhea, abdominal pain, blood in stool, hematemesis, jaundice and rectal bleeding. . ; ;  Genitourinary: Negative for dysuria, flank pain and hematuria. ; ; Musculoskeletal: +head injury. Negative for back pain and neck pain. Negative for swelling and trauma.; ; Skin: +abrasion. Negative for pruritus, rash, blisters, bruising and skin lesion.; ; Neuro: +seizure. Negative for headache, lightheadedness and neck stiffness. Negative for weakness, altered level of consciousness , altered mental status, extremity weakness, paresthesias, involuntary movement, and syncope.     Allergies  Aspirin  Home Medications   Current Outpatient Rx  Name Route Sig Dispense Refill  . ALBUTEROL SULFATE HFA 108 (90 BASE) MCG/ACT IN AERS Inhalation Inhale 1-2 puffs into the lungs every 6 (six) hours as needed for wheezing. 1 Inhaler 0  . ALPRAZOLAM 2 MG PO TABS Oral Take 2 mg by mouth 4 (four) times daily.     Marland Kitchen AMLODIPINE BESYLATE 10 MG PO TABS Oral Take 10 mg by mouth daily.      . ATENOLOL 100 MG PO TABS Oral Take 100 mg by mouth daily.      Marland Kitchen CARISOPRODOL 350 MG PO TABS Oral Take 350 mg by mouth 3 (three) times daily.    Marland Kitchen DIFLUNISAL 500 MG PO TABS Oral Take 500 mg by mouth 2 (two) times daily as needed. Seizures     . DIVALPROEX SODIUM 500 MG PO TBEC  Oral Take 2,000 mg by mouth at bedtime.     . DULOXETINE HCL 60 MG PO CPEP Oral Take 60 mg by mouth at bedtime.     . INSULIN GLARGINE 100 UNIT/ML [place] SOLN Subcutaneous Inject 70 Units into the skin at bedtime.     Marland Kitchen LISINOPRIL-HYDROCHLOROTHIAZIDE 20-25 MG PO TABS Oral Take 1 tablet by mouth daily.    Marland Kitchen METFORMIN HCL 1000 MG PO TABS Oral Take 1,000 mg by mouth 2 (two) times daily.        BP 136/93  Pulse 69  Temp 98.2 F (36.8 C)  Resp 20  Wt 181 lb (82.101 kg)  SpO2 98%  Physical Exam 1950: Physical examination:  Nursing notes reviewed; Vital signs and O2 SAT reviewed;  Constitutional: Well developed, Well nourished, Well hydrated, In no acute distress; Head:  Normocephalic, +small abrasion and mild edema to left forehead and eyelid; Eyes:  EOMI, PERRL, No scleral icterus. No obvious hyphema or hypopyon.; ENMT: Mouth and pharynx normal, Mucous membranes moist; Neck: Supple, Full range of motion, No lymphadenopathy; Cardiovascular: Regular rate and rhythm, No murmur, rub, or gallop; Respiratory: Breath sounds clear & equal bilaterally, No rales, rhonchi, wheezes.  Speaking full sentences with ease, Normal respiratory effort/excursion; Chest: Nontender, Movement normal; Abdomen: Soft, Nontender, Nondistended, Normal bowel sounds;; Extremities: Pulses normal, No tenderness, No edema, No calf edema or asymmetry.; Neuro: AA&Ox3, Major CN grossly intact.  No facial droop. Speech clear. Climbs on and off stretcher by himself easily.  Gait steady. No gross focal motor or sensory deficits in extremities.; Skin: Color normal, Warm, Dry.   ED Course  Procedures    MDM  MDM Reviewed: nursing note, vitals and previous chart Interpretation: CT scan and labs    Ct Head Wo Contrast 04/06/2012  *RADIOLOGY REPORT*  Clinical Data: Seizures.  History of fall with trauma to the head.  CT HEAD WITHOUT CONTRAST  Technique:  Contiguous axial images were obtained from the base of the skull through the vertex without contrast.  Comparison: Head CT 03/07/2012.  Findings: No acute displaced skull fractures are identified.  No acute intracranial abnormality.  Specifically, no evidence of acute post-traumatic intracranial hemorrhage, no definite regions of acute/subacute cerebral ischemia, no focal mass, mass effect, hydrocephalus or abnormal intra or extra-axial fluid collections. The visualized paranasal sinuses and mastoids are well pneumatized.  IMPRESSION: 1.  No acute displaced skull fractures or acute intracranial abnormalities. 2.  The appearance of the brain is normal.   Original Report Authenticated By: Florencia Reasons, M.D.      2200:  Pt given keppra and depakote PO.  Dx testing d/w pt.  Questions answered.  Verb understanding, agreeable to d/c home  with outpt f/u.   Laray Anger, DO 04/09/12 1059

## 2012-07-12 ENCOUNTER — Emergency Department (HOSPITAL_COMMUNITY)
Admission: EM | Admit: 2012-07-12 | Discharge: 2012-07-12 | Disposition: A | Discharge status: Home / Self Care | Payer: Medicaid Other | Attending: Emergency Medicine | Admitting: Emergency Medicine

## 2012-07-12 ENCOUNTER — Encounter (HOSPITAL_COMMUNITY): Payer: Self-pay | Admitting: Emergency Medicine

## 2012-07-12 DIAGNOSIS — F411 Generalized anxiety disorder: Secondary | ICD-10-CM | POA: Insufficient documentation

## 2012-07-12 DIAGNOSIS — F319 Bipolar disorder, unspecified: Secondary | ICD-10-CM | POA: Insufficient documentation

## 2012-07-12 DIAGNOSIS — R739 Hyperglycemia, unspecified: Secondary | ICD-10-CM

## 2012-07-12 DIAGNOSIS — R51 Headache: Secondary | ICD-10-CM | POA: Insufficient documentation

## 2012-07-12 DIAGNOSIS — I10 Essential (primary) hypertension: Secondary | ICD-10-CM | POA: Insufficient documentation

## 2012-07-12 DIAGNOSIS — F17200 Nicotine dependence, unspecified, uncomplicated: Secondary | ICD-10-CM | POA: Insufficient documentation

## 2012-07-12 DIAGNOSIS — Z79899 Other long term (current) drug therapy: Secondary | ICD-10-CM | POA: Insufficient documentation

## 2012-07-12 DIAGNOSIS — E1169 Type 2 diabetes mellitus with other specified complication: Secondary | ICD-10-CM | POA: Insufficient documentation

## 2012-07-12 DIAGNOSIS — Z8669 Personal history of other diseases of the nervous system and sense organs: Secondary | ICD-10-CM | POA: Insufficient documentation

## 2012-07-12 DIAGNOSIS — F3289 Other specified depressive episodes: Secondary | ICD-10-CM | POA: Insufficient documentation

## 2012-07-12 DIAGNOSIS — F329 Major depressive disorder, single episode, unspecified: Secondary | ICD-10-CM | POA: Insufficient documentation

## 2012-07-12 LAB — GLUCOSE, CAPILLARY: Glucose-Capillary: >600 mg/dL (ref 70–99)

## 2012-07-12 LAB — CBC WITH DIFFERENTIAL/PLATELET
HCT: 46.5 % (ref 39.0–52.0)
Hemoglobin: 17.4 g/dL — ABNORMAL HIGH (ref 13.0–17.0)
Lymphocytes Relative: 51 % — ABNORMAL HIGH (ref 12–46)
Lymphs Abs: 2.5 10*3/uL (ref 0.7–4.0)
MCHC: 37.4 g/dL — ABNORMAL HIGH (ref 30.0–36.0)
Monocytes Absolute: 0.4 10*3/uL (ref 0.1–1.0)
Monocytes Relative: 7 % (ref 3–12)
Neutro Abs: 2 10*3/uL (ref 1.7–7.7)
WBC: 4.9 10*3/uL (ref 4.0–10.5)

## 2012-07-12 LAB — BASIC METABOLIC PANEL
BUN: 11 mg/dL (ref 6–23)
CO2: 30 mEq/L (ref 19–32)
Chloride: 91 mEq/L — ABNORMAL LOW (ref 96–112)
Creatinine, Ser: 1.3 mg/dL (ref 0.50–1.35)
Glucose, Bld: 589 mg/dL (ref 70–99)

## 2012-07-12 LAB — URINE MICROSCOPIC-ADD ON: Urine-Other: NONE SEEN

## 2012-07-12 LAB — URINALYSIS, ROUTINE W REFLEX MICROSCOPIC
Bilirubin Urine: NEGATIVE
Glucose, UA: >1000 mg/dL — AB
Ketones, ur: 15 mg/dL — AB
pH: 6 (ref 5.0–8.0)

## 2012-07-12 MED ORDER — SODIUM CHLORIDE 0.9 % IV SOLN
INTRAVENOUS | Status: DC
  Administered 2012-07-12: 3 [IU]/h via INTRAVENOUS
  Filled 2012-07-12: qty 1

## 2012-07-12 MED ORDER — SODIUM CHLORIDE 0.9 % IV BOLUS (SEPSIS)
1000.0000 mL | Freq: Once | INTRAVENOUS | Status: AC
  Administered 2012-07-12: 1000 mL via INTRAVENOUS

## 2012-07-12 MED ORDER — SODIUM CHLORIDE 0.9 % IV BOLUS (SEPSIS): 1000.0000 mL | Freq: Once | INTRAVENOUS | Status: DC

## 2012-07-12 NOTE — ED Provider Notes (Signed)
History    This chart was scribed for Charles B. Bernette Mayers, MD, MD by Smitty Pluck, ED Scribe. The patient was seen in room APA19 and the patient's care was started at 1:19PM.   CSN: [id number]  Arrival date & time 07/12/12  1209      Chief Complaint  Patient presents with  . Hyperglycemia    (Consider location/radiation/quality/duration/timing/severity/associated sxs/prior treatment) The history is provided by the patient. No language interpreter was used.   Melvin Taylor is a 46 y.o. male with hx of DM who presents to the Emergency Department referred by PCP (Dr. Felecia Shelling) due to hyperglycemia today. Pt normally takes 70 units lantis and metformin daily for DM treatment. He denies fever, nausea, vomiting and diarrhea. He reports having mild headache and feeling light headed. He has noticed increased urinary frequency and thirst. Denies taking new medications, missing his current medication dosages and any other symptoms.  Past Medical History  Diagnosis Date  . Seizures   . Anxiety   . Depression   . Diabetes mellitus   . HTN (hypertension)   . Sleep apnea   . Bipolar 1 disorder   . Insomnia   . Headache     Past Surgical History  Procedure Date  . Mandible fracture surgery   . Knee surgery   . Tonsillectomy     Family History  Problem Relation Age of Onset  . Arthritis    . Diabetes    . Asthma      History  Substance Use Topics  . Smoking status: Current Every Day Smoker -- 1.0 packs/day for 30 years    Types: Cigarettes  . Smokeless tobacco: Never Used  . Alcohol Use: No      Review of Systems  All other systems reviewed and are negative.  10 Systems reviewed and all are negative for acute change except as noted in the HPI.   Allergies  Aspirin  Home Medications   Current Outpatient Rx  Name  Route  Sig  Dispense  Refill  . ALPRAZOLAM 2 MG PO TABS   Oral   Take 2 mg by mouth 4 (four) times daily.          Marland Kitchen AMLODIPINE BESYLATE 10 MG PO  TABS   Oral   Take 10 mg by mouth daily.           . ATENOLOL 100 MG PO TABS   Oral   Take 100 mg by mouth daily.           Marland Kitchen CARISOPRODOL 350 MG PO TABS   Oral   Take 350 mg by mouth 3 (three) times daily.         Marland Kitchen DIFLUNISAL 500 MG PO TABS   Oral   Take 500 mg by mouth 2 (two) times daily as needed. Seizures          . DIVALPROEX SODIUM 500 MG PO TBEC   Oral   Take 2,000 mg by mouth at bedtime.          . DULOXETINE HCL 60 MG PO CPEP   Oral   Take 60 mg by mouth at bedtime.          . INSULIN GLARGINE 100 UNIT/ML [place] SOLN   Subcutaneous   Inject 70 Units into the skin at bedtime.          Marland Kitchen LEVETIRACETAM 500 MG PO TABS   Oral   Take 500 mg by mouth 2 (two) times daily.         Marland Kitchen  LISINOPRIL-HYDROCHLOROTHIAZIDE 20-25 MG PO TABS   Oral   Take 1 tablet by mouth daily.         Marland Kitchen METFORMIN HCL 1000 MG PO TABS   Oral   Take 1,000 mg by mouth 2 (two) times daily.           . ALBUTEROL SULFATE HFA 108 (90 BASE) MCG/ACT IN AERS   Inhalation   Inhale 1-2 puffs into the lungs every 6 (six) hours as needed for wheezing.   1 Inhaler   0     BP 129/91  Pulse 99  Temp 98 F (36.7 C) (Oral)  Resp 20  Ht 5\' 6"  (1.676 m)  Wt 180 lb (81.647 kg)  BMI 29.05 kg/m2  SpO2 98%  Physical Exam  Nursing note and vitals reviewed. Constitutional: He is oriented to person, place, and time. He appears well-developed and well-nourished.  HENT:  Head: Normocephalic and atraumatic.  Eyes: EOM are normal. Pupils are equal, round, and reactive to light.  Neck: Normal range of motion. Neck supple.  Cardiovascular: Normal rate, normal heart sounds and intact distal pulses.   Pulmonary/Chest: Effort normal and breath sounds normal.  Abdominal: Bowel sounds are normal. He exhibits no distension. There is no tenderness.  Musculoskeletal: Normal range of motion. He exhibits no edema and no tenderness.  Neurological: He is alert and oriented to person, place, and time.  He has normal strength. No cranial nerve deficit or sensory deficit.  Skin: Skin is warm and dry. No rash noted.  Psychiatric: He has a normal mood and affect.    ED Course  Procedures (including critical care time) DIAGNOSTIC STUDIES: Oxygen Saturation is 98% on room air, normal by my interpretation.    COORDINATION OF CARE: 1:22 PM Discussed ED treatment with pt  1:36 PM Ordered:  Medications  sodium chloride 0.9 % bolus 1,000 mL (not administered)  insulin regular (NOVOLIN R,HUMULIN R) 1 Units/mL in sodium chloride 0.9 % 100 mL infusion (not administered)       Labs Reviewed  GLUCOSE, CAPILLARY - Abnormal; Notable for the following:    Glucose-Capillary >600 (*)     All other components within normal limits  CBC WITH DIFFERENTIAL - Abnormal; Notable for the following:    Hemoglobin 17.4 (*)     MCHC 37.4 (*)     Platelets 136 (*)     Neutrophils Relative 42 (*)     Lymphocytes Relative 51 (*)     All other components within normal limits  BASIC METABOLIC PANEL - Abnormal; Notable for the following:    Sodium 134 (*)     Chloride 91 (*)     Glucose, Bld 589 (*)     GFR calc non Af Amer 64 (*)     GFR calc Af Amer 74 (*)     All other components within normal limits  URINALYSIS, ROUTINE W REFLEX MICROSCOPIC - Abnormal; Notable for the following:    Glucose, UA >1000 (*)     Ketones, ur 15 (*)     All other components within normal limits  GLUCOSE, CAPILLARY - Abnormal; Notable for the following:    Glucose-Capillary 356 (*)     All other components within normal limits  URINE MICROSCOPIC-ADD ON   No results found.   No diagnosis found.    MDM  CBG improving. No significant acidosis. No clear reason for increased glucose. Pt states he has been taking his medications as directed. Will d/c home with close PCP  follow up. Pt amenable to this plan. Advised to return for any worsening symptoms.    I personally performed the services described in this  documentation, which was scribed in my presence. The recorded information has been reviewed and is accurate.        Charles B. Bernette Mayers, MD 07/12/12 [phone]

## 2012-07-12 NOTE — ED Notes (Signed)
At PCP for routine checkup and CBG was greater than 500

## 2012-07-28 ENCOUNTER — Emergency Department (HOSPITAL_COMMUNITY)
Admission: EM | Admit: 2012-07-28 | Discharge: 2012-07-28 | Disposition: A | Discharge status: Home / Self Care | Payer: Medicaid Other | Attending: Emergency Medicine | Admitting: Emergency Medicine

## 2012-07-28 ENCOUNTER — Emergency Department (HOSPITAL_COMMUNITY): Payer: Medicaid Other

## 2012-07-28 ENCOUNTER — Other Ambulatory Visit: Payer: Self-pay

## 2012-07-28 ENCOUNTER — Encounter (HOSPITAL_COMMUNITY): Payer: Self-pay

## 2012-07-28 DIAGNOSIS — Z79899 Other long term (current) drug therapy: Secondary | ICD-10-CM | POA: Insufficient documentation

## 2012-07-28 DIAGNOSIS — R079 Chest pain, unspecified: Secondary | ICD-10-CM

## 2012-07-28 DIAGNOSIS — F3289 Other specified depressive episodes: Secondary | ICD-10-CM | POA: Insufficient documentation

## 2012-07-28 DIAGNOSIS — F319 Bipolar disorder, unspecified: Secondary | ICD-10-CM | POA: Insufficient documentation

## 2012-07-28 DIAGNOSIS — G40909 Epilepsy, unspecified, not intractable, without status epilepticus: Secondary | ICD-10-CM | POA: Insufficient documentation

## 2012-07-28 DIAGNOSIS — R071 Chest pain on breathing: Secondary | ICD-10-CM | POA: Insufficient documentation

## 2012-07-28 DIAGNOSIS — F411 Generalized anxiety disorder: Secondary | ICD-10-CM | POA: Insufficient documentation

## 2012-07-28 DIAGNOSIS — F329 Major depressive disorder, single episode, unspecified: Secondary | ICD-10-CM | POA: Insufficient documentation

## 2012-07-28 DIAGNOSIS — F0390 Unspecified dementia without behavioral disturbance: Secondary | ICD-10-CM | POA: Insufficient documentation

## 2012-07-28 DIAGNOSIS — Z794 Long term (current) use of insulin: Secondary | ICD-10-CM | POA: Insufficient documentation

## 2012-07-28 DIAGNOSIS — Z8679 Personal history of other diseases of the circulatory system: Secondary | ICD-10-CM | POA: Insufficient documentation

## 2012-07-28 DIAGNOSIS — E119 Type 2 diabetes mellitus without complications: Secondary | ICD-10-CM | POA: Insufficient documentation

## 2012-07-28 DIAGNOSIS — F17200 Nicotine dependence, unspecified, uncomplicated: Secondary | ICD-10-CM | POA: Insufficient documentation

## 2012-07-28 DIAGNOSIS — I10 Essential (primary) hypertension: Secondary | ICD-10-CM | POA: Insufficient documentation

## 2012-07-28 DIAGNOSIS — G4730 Sleep apnea, unspecified: Secondary | ICD-10-CM | POA: Insufficient documentation

## 2012-07-28 LAB — BASIC METABOLIC PANEL
BUN: 6 mg/dL (ref 6–23)
CO2: 25 mEq/L (ref 19–32)
Calcium: 8.6 mg/dL (ref 8.4–10.5)
Chloride: 101 mEq/L (ref 96–112)
Creatinine, Ser: 0.76 mg/dL (ref 0.50–1.35)
GFR calc Af Amer: >90 mL/min (ref >90)
GFR calc non Af Amer: >90 mL/min (ref >90)
Glucose, Bld: 110 mg/dL — ABNORMAL HIGH (ref 70–99)
Potassium: 2.9 mEq/L — ABNORMAL LOW (ref 3.5–5.1)
Sodium: 138 mEq/L (ref 135–145)

## 2012-07-28 LAB — CBC WITH DIFFERENTIAL/PLATELET
Basophils Absolute: 0 10*3/uL (ref 0.0–0.1)
Basophils Relative: 0 % (ref 0–1)
Eosinophils Absolute: 0 10*3/uL (ref 0.0–0.7)
Eosinophils Relative: 1 % (ref 0–5)
HCT: 40.1 % (ref 39.0–52.0)
Hemoglobin: 14.3 g/dL (ref 13.0–17.0)
Lymphocytes Relative: 47 % — ABNORMAL HIGH (ref 12–46)
Lymphs Abs: 2.8 10*3/uL (ref 0.7–4.0)
MCH: 31 pg (ref 26.0–34.0)
MCHC: 35.7 g/dL (ref 30.0–36.0)
MCV: 87 fL (ref 78.0–100.0)
Monocytes Absolute: 0.6 10*3/uL (ref 0.1–1.0)
Monocytes Relative: 9 % (ref 3–12)
Neutro Abs: 2.5 10*3/uL (ref 1.7–7.7)
Neutrophils Relative %: 43 % (ref 43–77)
Platelets: 186 10*3/uL (ref 150–400)
RBC: 4.61 MIL/uL (ref 4.22–5.81)
RDW: 13.1 % (ref 11.5–15.5)
WBC: 5.9 10*3/uL (ref 4.0–10.5)

## 2012-07-28 LAB — TROPONIN I
Troponin I: <0.3 ng/mL (ref <0.30)
Troponin I: <0.3 ng/mL (ref <0.30)

## 2012-07-28 MED ORDER — POTASSIUM CHLORIDE CRYS ER 20 MEQ PO TBCR
60.0000 meq | EXTENDED_RELEASE_TABLET | Freq: Once | ORAL | Status: AC
  Administered 2012-07-28: 60 meq via ORAL
  Filled 2012-07-28: qty 3

## 2012-07-28 MED ORDER — MORPHINE SULFATE 4 MG/ML IJ SOLN
6.0000 mg | Freq: Once | INTRAMUSCULAR | Status: AC
  Administered 2012-07-28: 6 mg via INTRAVENOUS

## 2012-07-28 MED ORDER — MORPHINE SULFATE 4 MG/ML IJ SOLN
INTRAMUSCULAR | Status: AC
  Filled 2012-07-28: qty 1

## 2012-07-28 MED ORDER — MORPHINE SULFATE 2 MG/ML IJ SOLN
INTRAMUSCULAR | Status: AC
  Filled 2012-07-28: qty 1

## 2012-07-28 NOTE — ED Notes (Signed)
Patient requesting pain medication. Dr. Kohut aware. 

## 2012-07-28 NOTE — ED Provider Notes (Signed)
History   This chart was scribed for Raeford Razor, MD by Sofie Rower, ED Scribe. The patient was seen in room APA04/APA04 and the patient's care was started at 7:03AM.    CSN: [id number]  Arrival date & time 07/28/12  0700   First MD Initiated Contact with Patient 07/28/12 0703      Chief Complaint  Patient presents with  . Chest Pain    (Consider location/radiation/quality/duration/timing/severity/associated sxs/prior treatment) Patient is a 46 y.o. male presenting with chest pain. The history is provided by the patient. No language interpreter was used.  Chest Pain The chest pain began 6 - 12 hours ago. Chest pain occurs constantly. The chest pain is worsening. The severity of the pain is moderate. The quality of the pain is described as aching. The pain does not radiate. Chest pain is worsened by certain positions. Pertinent negatives for primary symptoms include no fever, no cough and no abdominal pain.     Melvin Taylor is a 46 y.o. male , with a hx of headache and seizures, who presents to the Emergency Department complaining of sudden, progressively worsening, non radiating chest pain, located at the central chest, onset today (07/28/12).  Associated symptoms include tachycardia, shortness of breath, and headache. The pt reports he awoke this morning at 3:00AM, experiencing chest pain and tachycardia. The pt characterizes his chest pain as a constant painful sensation, feeling as if someone punched him in the chest. Modifying factors include certain movements and positions which intensify the chest pain.   The pt denies cough, fever, chills, and abdominal pain.  In addition, the pt denies any hx of cardiac disease, ever visiting a cardiologist, nor having a stress test performed.   The pt is a current everyday smoker (1.0 packs/day), however, he does not drink alcohol.   PCP is Dr. Felecia Shelling.    Past Medical History  Diagnosis Date  . Seizures   . Anxiety   . Depression   .  Diabetes mellitus   . HTN (hypertension)   . Sleep apnea   . Bipolar 1 disorder   . Insomnia   . Headache     Past Surgical History  Procedure Date  . Mandible fracture surgery   . Knee surgery   . Tonsillectomy     Family History  Problem Relation Age of Onset  . Arthritis    . Diabetes    . Asthma      History  Substance Use Topics  . Smoking status: Current Every Day Smoker -- 1.0 packs/day for 30 years    Types: Cigarettes  . Smokeless tobacco: Never Used  . Alcohol Use: No      Review of Systems  Constitutional: Negative for fever.  Respiratory: Negative for cough.   Cardiovascular: Positive for chest pain.  Gastrointestinal: Negative for abdominal pain.  All other systems reviewed and are negative.    Allergies  Aspirin  Home Medications   Current Outpatient Rx  Name  Route  Sig  Dispense  Refill  . ALBUTEROL SULFATE HFA 108 (90 BASE) MCG/ACT IN AERS   Inhalation   Inhale 2 puffs into the lungs every 6 (six) hours as needed. For shortness of breath         . ALPRAZOLAM 2 MG PO TABS   Oral   Take 2 mg by mouth 4 (four) times daily.          Marland Kitchen AMLODIPINE BESYLATE 10 MG PO TABS   Oral   Take 10  mg by mouth daily.           . ATENOLOL 100 MG PO TABS   Oral   Take 100 mg by mouth daily.           Marland Kitchen CARISOPRODOL 350 MG PO TABS   Oral   Take 350 mg by mouth 3 (three) times daily.         Marland Kitchen DIFLUNISAL 500 MG PO TABS   Oral   Take 500 mg by mouth every evening. Seizures         . DIVALPROEX SODIUM 500 MG PO TBEC   Oral   Take 2,000 mg by mouth at bedtime.          . DULOXETINE HCL 60 MG PO CPEP   Oral   Take 60 mg by mouth at bedtime.          . INSULIN GLARGINE 100 UNIT/ML [place] SOLN   Subcutaneous   Inject 70 Units into the skin at bedtime.          Marland Kitchen LEVETIRACETAM 500 MG PO TABS   Oral   Take 500 mg by mouth 2 (two) times daily.         Marland Kitchen LISINOPRIL-HYDROCHLOROTHIAZIDE 20-25 MG PO TABS   Oral   Take 1 tablet by  mouth daily.         Marland Kitchen METFORMIN HCL 1000 MG PO TABS   Oral   Take 1,000 mg by mouth 2 (two) times daily.           . OXYCODONE-ACETAMINOPHEN 5-325 MG PO TABS   Oral   Take 1 tablet by mouth every 4 (four) hours as needed. For pain           BP 133/87  Pulse 72  Temp 98.2 F (36.8 C) (Oral)  Resp 18  SpO2 100%  Physical Exam  Nursing note and vitals reviewed. Constitutional: He appears well-developed and well-nourished. No distress.  HENT:  Head: Normocephalic and atraumatic.  Eyes: Conjunctivae normal are normal. Right eye exhibits no discharge. Left eye exhibits no discharge.  Neck: Neck supple.  Cardiovascular: Normal rate, regular rhythm and normal heart sounds.  Exam reveals no gallop and no friction rub.   No murmur heard. Pulmonary/Chest: Effort normal and breath sounds normal. No respiratory distress. He exhibits tenderness.       Anterior chest wall tender to palpitation.   Abdominal: Soft. He exhibits no distension. There is no tenderness.  Musculoskeletal: He exhibits no edema and no tenderness.       No calf tenderness, no lower extremity edema. Negative homans sign.   Neurological: He is alert.  Skin: Skin is warm and dry.       No concerning skin lesions.   Psychiatric: He has a normal mood and affect. His behavior is normal. Thought content normal.      ED Course  Procedures (including critical care time)  DIAGNOSTIC STUDIES: Oxygen Saturation is 100% on room air, normal by my interpretation.    COORDINATION OF CARE:  7:18 AM- Treatment plan discussed with patient. Pt agrees with treatment.  8:15 AM-Recheck. Treatment plan discussed with patient. Pt agrees with treatment.         Results for orders placed during the hospital encounter of 07/28/12  TROPONIN I      Component Value Range   Troponin I <0.30  <0.30 ng/mL  CBC WITH DIFFERENTIAL      Component Value Range   WBC 5.9  4.0 -  10.5 K/uL   RBC 4.61  4.22 - 5.81 MIL/uL    Hemoglobin 14.3  13.0 - 17.0 g/dL   HCT 86.5  78.4 - 69.6 %   MCV 87.0  78.0 - 100.0 fL   MCH 31.0  26.0 - 34.0 pg   MCHC 35.7  30.0 - 36.0 g/dL   RDW 29.5  28.4 - 13.2 %   Platelets 186  150 - 400 K/uL   Neutrophils Relative 43  43 - 77 %   Neutro Abs 2.5  1.7 - 7.7 K/uL   Lymphocytes Relative 47 (*) 12 - 46 %   Lymphs Abs 2.8  0.7 - 4.0 K/uL   Monocytes Relative 9  3 - 12 %   Monocytes Absolute 0.6  0.1 - 1.0 K/uL   Eosinophils Relative 1  0 - 5 %   Eosinophils Absolute 0.0  0.0 - 0.7 K/uL   Basophils Relative 0  0 - 1 %   Basophils Absolute 0.0  0.0 - 0.1 K/uL  BASIC METABOLIC PANEL      Component Value Range   Sodium 138  135 - 145 mEq/L   Potassium 2.9 (*) 3.5 - 5.1 mEq/L   Chloride 101  96 - 112 mEq/L   CO2 25  19 - 32 mEq/L   Glucose, Bld 110 (*) 70 - 99 mg/dL   BUN 6  6 - 23 mg/dL   Creatinine, Ser 4.40  0.50 - 1.35 mg/dL   Calcium 8.6  8.4 - 10.2 mg/dL   GFR calc non Af Amer >90  >90 mL/min   GFR calc Af Amer >90  >90 mL/min  TROPONIN I      Component Value Range   Troponin I <0.30  <0.30 ng/mL   Dg Chest Portable 1 View  07/28/2012  *RADIOLOGY REPORT*  Clinical Data: Chest pain.  PORTABLE CHEST - 1 VIEW  Comparison: 02/20/2012 CT.  Plain film 07/03/2011.  Findings: Midline trachea. Normal heart size and mediastinal contours.  No pleural effusion or pneumothorax.  Clear lungs.  IMPRESSION: Normal chest.   Original Report Authenticated By: Jeronimo Greaves, M.D.    EKG:  Rhythm: normal sinus Vent. rate 72 BPM PR interval 180 ms QRS duration 90 ms QT/QTc 392/429 ms ST segments: NS ST changes    1. Chest pain       MDM  46yM with CP. Atypical for ACS. Trop normal. EKG with nonspecific changes. CXR clear. Feel safe for DC.      I personally preformed the services scribed in my presence. The recorded information has been reviewed is accurate. Raeford Razor, MD.      Raeford Razor, MD 08/02/12 [phone]

## 2012-07-28 NOTE — ED Notes (Signed)
Patient with c/o chest pain that started last night. No relief with 1 sl nitro given by EMS PTA. Rates pain 10/10

## 2012-07-28 NOTE — ED Notes (Signed)
Patient with no complaints at this time. Respirations even and unlabored. Skin warm/dry. Discharge instructions reviewed with patient at this time. Patient given opportunity to voice concerns/ask questions. IV removed per policy and band-aid applied to site. Patient discharged at this time and left Emergency Department with steady gait.  

## 2012-12-06 ENCOUNTER — Encounter (HOSPITAL_COMMUNITY): Payer: Self-pay | Admitting: *Deleted

## 2012-12-06 ENCOUNTER — Emergency Department (HOSPITAL_COMMUNITY)
Admission: EM | Admit: 2012-12-06 | Discharge: 2012-12-06 | Disposition: A | Discharge status: Home / Self Care | Payer: Medicaid Other | Attending: Emergency Medicine | Admitting: Emergency Medicine

## 2012-12-06 DIAGNOSIS — F17200 Nicotine dependence, unspecified, uncomplicated: Secondary | ICD-10-CM | POA: Insufficient documentation

## 2012-12-06 DIAGNOSIS — R739 Hyperglycemia, unspecified: Secondary | ICD-10-CM

## 2012-12-06 DIAGNOSIS — G40909 Epilepsy, unspecified, not intractable, without status epilepticus: Secondary | ICD-10-CM | POA: Insufficient documentation

## 2012-12-06 DIAGNOSIS — F319 Bipolar disorder, unspecified: Secondary | ICD-10-CM | POA: Insufficient documentation

## 2012-12-06 DIAGNOSIS — R5381 Other malaise: Secondary | ICD-10-CM | POA: Insufficient documentation

## 2012-12-06 DIAGNOSIS — R5383 Other fatigue: Secondary | ICD-10-CM | POA: Insufficient documentation

## 2012-12-06 DIAGNOSIS — F411 Generalized anxiety disorder: Secondary | ICD-10-CM | POA: Insufficient documentation

## 2012-12-06 DIAGNOSIS — Z79899 Other long term (current) drug therapy: Secondary | ICD-10-CM | POA: Insufficient documentation

## 2012-12-06 DIAGNOSIS — E119 Type 2 diabetes mellitus without complications: Secondary | ICD-10-CM

## 2012-12-06 DIAGNOSIS — E1169 Type 2 diabetes mellitus with other specified complication: Secondary | ICD-10-CM | POA: Insufficient documentation

## 2012-12-06 DIAGNOSIS — I10 Essential (primary) hypertension: Secondary | ICD-10-CM | POA: Insufficient documentation

## 2012-12-06 LAB — COMPREHENSIVE METABOLIC PANEL
AST: 17 U/L (ref 0–37)
Albumin: 4.1 g/dL (ref 3.5–5.2)
Alkaline Phosphatase: 107 U/L (ref 39–117)
Chloride: 84 mEq/L — ABNORMAL LOW (ref 96–112)
Potassium: 4.5 mEq/L (ref 3.5–5.1)
Sodium: 125 mEq/L — ABNORMAL LOW (ref 135–145)
Total Bilirubin: 0.5 mg/dL (ref 0.3–1.2)
Total Protein: 7.2 g/dL (ref 6.0–8.3)

## 2012-12-06 LAB — GLUCOSE, CAPILLARY
Glucose-Capillary: >600 mg/dL (ref 70–99)
Glucose-Capillary: 420 mg/dL — ABNORMAL HIGH (ref 70–99)
Glucose-Capillary: 298 mg/dL — ABNORMAL HIGH (ref 70–99)

## 2012-12-06 LAB — CBC WITH DIFFERENTIAL/PLATELET
Basophils Absolute: 0 10*3/uL (ref 0.0–0.1)
Basophils Relative: 1 % (ref 0–1)
Eosinophils Absolute: 0 10*3/uL (ref 0.0–0.7)
Hemoglobin: 15.9 g/dL (ref 13.0–17.0)
MCH: 31.2 pg (ref 26.0–34.0)
MCHC: 36.7 g/dL — ABNORMAL HIGH (ref 30.0–36.0)
Neutro Abs: 2.4 10*3/uL (ref 1.7–7.7)
Neutrophils Relative %: 55 % (ref 43–77)
Platelets: 171 10*3/uL (ref 150–400)
RDW: 12.5 % (ref 11.5–15.5)

## 2012-12-06 MED ORDER — OXYCODONE-ACETAMINOPHEN 5-325 MG PO TABS
2.0000 | ORAL_TABLET | Freq: Once | ORAL | Status: AC
  Administered 2012-12-06: 2 via ORAL
  Filled 2012-12-06: qty 2

## 2012-12-06 MED ORDER — SODIUM CHLORIDE 0.9 % IV BOLUS (SEPSIS)
1000.0000 mL | Freq: Once | INTRAVENOUS | Status: AC
  Administered 2012-12-06: 1000 mL via INTRAVENOUS

## 2012-12-06 MED ORDER — SODIUM CHLORIDE 0.9 % IV SOLN
INTRAVENOUS | Status: DC
  Administered 2012-12-06: 6.3 [IU]/h via INTRAVENOUS
  Filled 2012-12-06: qty 1

## 2012-12-06 MED ORDER — OXYCODONE-ACETAMINOPHEN 5-325 MG PO TABS: 2.0000 | ORAL_TABLET | ORAL | Status: DC | PRN

## 2012-12-06 MED ORDER — PROMETHAZINE HCL 25 MG PO TABS: 25.0000 mg | ORAL_TABLET | Freq: Four times a day (QID) | ORAL | Status: DC | PRN

## 2012-12-06 NOTE — ED Notes (Signed)
CRITICAL VALUE ALERT  Critical value received: Glucose 690  Date of notification: 12/06/2012  Time of notification:  1326  Critical value read back Glucose 690  Nurse who received alert: JLR RN  MD notified (1st page):  1326  Time of first page:1326  MD notified (2nd page):  Time of second page:  Responding MD: Dr Adriana Simas  Time MD responded: [phone]

## 2012-12-06 NOTE — ED Notes (Signed)
Pt presents with c/o seizure this morning,  Headache and hyperglycemia. CBG checked, unable to obtain reading. Blood sent to lab. Pt states is out of Depakote, seizure medication so has not taken this in a couple of days. Pt reports taking Keppra  Only. Pt sees pain clinic for management of headaches, but admits to not having any pain medication until seen by said Dr. Rock Nephew usually take 7.5/325 percocet for pain. Pt refers to seizure as unwitnessed, but knows he had one.

## 2012-12-06 NOTE — ED Provider Notes (Signed)
History    This chart was scribed for Donnetta Hutching, MD by Donne Anon, ED Scribe. This patient was seen in room APA07/APA07 and the patient's care was started at 1250.   CSN: [id number]  Arrival date & time 12/06/12  1207   First MD Initiated Contact with Patient 12/06/12 1250      Chief Complaint  Patient presents with  . Hyperglycemia  . Weakness     The history is provided by the patient. No language interpreter was used.    Melvin Taylor is a 47 y.o. male who presents to the Emergency Department complaining of hyperglycemia, which he attributes to his diet. He states he has a h/o epilepsy and he also had a seizure this morning, which left him with a HA. He states he normally has several seizures every year, and he takes 2 medications for them. He lives alone in an apartment.  He is followed by Dr. Felecia Shelling. Past Medical History  Diagnosis Date  . Seizures   . Anxiety   . Depression   . Diabetes mellitus   . HTN (hypertension)   . Sleep apnea   . Bipolar 1 disorder   . Insomnia   . Headache     Past Surgical History  Procedure Laterality Date  . Mandible fracture surgery    . Knee surgery    . Tonsillectomy      Family History  Problem Relation Age of Onset  . Arthritis    . Diabetes    . Asthma      History  Substance Use Topics  . Smoking status: Current Every Day Smoker -- 1.00 packs/day for 30 years    Types: Cigarettes  . Smokeless tobacco: Never Used  . Alcohol Use: No      Review of Systems A complete 10 system review of systems was obtained and all systems are negative except as noted in the HPI and PMH.   Allergies  Aspirin  Home Medications   Current Outpatient Rx  Name  Route  Sig  Dispense  Refill  . albuterol (PROVENTIL HFA;VENTOLIN HFA) 108 (90 BASE) MCG/ACT inhaler   Inhalation   Inhale 2 puffs into the lungs every 6 (six) hours as needed. For shortness of breath         . alprazolam (XANAX) 2 MG tablet   Oral   Take 2 mg  by mouth 4 (four) times daily.          Marland Kitchen amLODipine (NORVASC) 10 MG tablet   Oral   Take 10 mg by mouth daily.           Marland Kitchen atenolol (TENORMIN) 100 MG tablet   Oral   Take 100 mg by mouth daily.           . carisoprodol (SOMA) 350 MG tablet   Oral   Take 350 mg by mouth 3 (three) times daily.         . diflunisal (DOLOBID) 500 MG TABS   Oral   Take 500 mg by mouth every evening. Seizures         . divalproex (DEPAKOTE) 500 MG EC tablet   Oral   Take 2,000 mg by mouth at bedtime.          . DULoxetine (CYMBALTA) 60 MG capsule   Oral   Take 60 mg by mouth at bedtime.          . insulin glargine (LANTUS) 100 UNIT/ML injection   Subcutaneous  Inject 70 Units into the skin at bedtime.          . levETIRAcetam (KEPPRA) 500 MG tablet   Oral   Take 500 mg by mouth 2 (two) times daily.         Marland Kitchen lisinopril-hydrochlorothiazide (PRINZIDE,ZESTORETIC) 20-25 MG per tablet   Oral   Take 1 tablet by mouth daily.         . metFORMIN (GLUCOPHAGE) 1000 MG tablet   Oral   Take 1,000 mg by mouth 2 (two) times daily.           Marland Kitchen oxyCODONE-acetaminophen (PERCOCET/ROXICET) 5-325 MG per tablet   Oral   Take 1 tablet by mouth every 4 (four) hours as needed. For pain           BP 128/95  Pulse 83  Temp(Src) 98 F (36.7 C) (Oral)  Resp 16  Ht 5\' 6"  (1.676 m)  Wt 170 lb (77.111 kg)  BMI 27.45 kg/m2  SpO2 97%  Physical Exam  Nursing note and vitals reviewed. Constitutional: He is oriented to person, place, and time. He appears well-developed and well-nourished.  HENT:  Head: Normocephalic and atraumatic.  Eyes: Conjunctivae and EOM are normal. Pupils are equal, round, and reactive to light.  Neck: Normal range of motion. Neck supple.  Cardiovascular: Normal rate.   Pulmonary/Chest: Effort normal.  Abdominal: Soft.  Musculoskeletal: Normal range of motion.  Neurological: He is alert and oriented to person, place, and time.  Skin: Skin is warm and dry.   Psychiatric: He has a normal mood and affect.    ED Course  Procedures (including critical care time) DIAGNOSTIC STUDIES: Oxygen Saturation is 97% on room air, adequate by my interpretation.    COORDINATION OF CARE: 1:07 PM Discussed treatment plan which includes IV fluids, Percocet, Insulin, and a glucomander with pt at bedside and pt agreed to plan.   Results for orders placed during the hospital encounter of 12/06/12  CBC WITH DIFFERENTIAL      Result Value Range   WBC 4.4  4.0 - 10.5 K/uL   RBC 5.10  4.22 - 5.81 MIL/uL   Hemoglobin 15.9  13.0 - 17.0 g/dL   HCT 09.8  11.9 - 14.7 %   MCV 84.9  78.0 - 100.0 fL   MCH 31.2  26.0 - 34.0 pg   MCHC 36.7 (*) 30.0 - 36.0 g/dL   RDW 82.9  56.2 - 13.0 %   Platelets 171  150 - 400 K/uL   Neutrophils Relative 55  43 - 77 %   Neutro Abs 2.4  1.7 - 7.7 K/uL   Lymphocytes Relative 39  12 - 46 %   Lymphs Abs 1.7  0.7 - 4.0 K/uL   Monocytes Relative 5  3 - 12 %   Monocytes Absolute 0.2  0.1 - 1.0 K/uL   Eosinophils Relative 1  0 - 5 %   Eosinophils Absolute 0.0  0.0 - 0.7 K/uL   Basophils Relative 1  0 - 1 %   Basophils Absolute 0.0  0.0 - 0.1 K/uL  GLUCOSE, CAPILLARY      Result Value Range   Glucose-Capillary >600 (*) 70 - 99 mg/dL   Comment 1 Confirm Test in Lab     No results found.    Labs Reviewed  CBC WITH DIFFERENTIAL - Abnormal; Notable for the following:    MCHC 36.7 (*)    All other components within normal limits  GLUCOSE, CAPILLARY - Abnormal; Notable for  the following:    Glucose-Capillary >600 (*)    All other components within normal limits  COMPREHENSIVE METABOLIC PANEL   No results found.   No diagnosis found.    MDM  Patient is not in DKA although his glucose is elevated.   He is feeling better after IV fluids and insulin administration.   Discharge home. He will increase his subcutaneous insulin and monitors glucoses carefully.  He has primary care followup.   Discharge meds Percocet #20 and Phenergan  25 mg #20 I personally performed the services described in this documentation, which was scribed in my presence. The recorded information has been reviewed and is accurate.         Donnetta Hutching, MD 12/06/12 1606

## 2012-12-06 NOTE — ED Notes (Signed)
States increased weakness since this morning.  States feels like he had a seizure in his sleep because of increased weakness upon waking this morning.  Also feels like his blood sugar is high.  C/o headache.

## 2012-12-27 ENCOUNTER — Encounter (HOSPITAL_COMMUNITY): Payer: Self-pay | Admitting: *Deleted

## 2012-12-27 ENCOUNTER — Emergency Department (HOSPITAL_COMMUNITY)
Admission: EM | Admit: 2012-12-27 | Discharge: 2012-12-27 | Disposition: A | Discharge status: Home / Self Care | Payer: Medicaid Other | Attending: Emergency Medicine | Admitting: Emergency Medicine

## 2012-12-27 DIAGNOSIS — I10 Essential (primary) hypertension: Secondary | ICD-10-CM | POA: Insufficient documentation

## 2012-12-27 DIAGNOSIS — F329 Major depressive disorder, single episode, unspecified: Secondary | ICD-10-CM | POA: Insufficient documentation

## 2012-12-27 DIAGNOSIS — E1169 Type 2 diabetes mellitus with other specified complication: Secondary | ICD-10-CM | POA: Insufficient documentation

## 2012-12-27 DIAGNOSIS — F3289 Other specified depressive episodes: Secondary | ICD-10-CM | POA: Insufficient documentation

## 2012-12-27 DIAGNOSIS — R51 Headache: Secondary | ICD-10-CM | POA: Insufficient documentation

## 2012-12-27 DIAGNOSIS — F411 Generalized anxiety disorder: Secondary | ICD-10-CM | POA: Insufficient documentation

## 2012-12-27 DIAGNOSIS — R739 Hyperglycemia, unspecified: Secondary | ICD-10-CM

## 2012-12-27 DIAGNOSIS — R569 Unspecified convulsions: Secondary | ICD-10-CM

## 2012-12-27 DIAGNOSIS — Z794 Long term (current) use of insulin: Secondary | ICD-10-CM | POA: Insufficient documentation

## 2012-12-27 DIAGNOSIS — F17200 Nicotine dependence, unspecified, uncomplicated: Secondary | ICD-10-CM | POA: Insufficient documentation

## 2012-12-27 DIAGNOSIS — F319 Bipolar disorder, unspecified: Secondary | ICD-10-CM | POA: Insufficient documentation

## 2012-12-27 DIAGNOSIS — G4730 Sleep apnea, unspecified: Secondary | ICD-10-CM | POA: Insufficient documentation

## 2012-12-27 DIAGNOSIS — Z8669 Personal history of other diseases of the nervous system and sense organs: Secondary | ICD-10-CM | POA: Insufficient documentation

## 2012-12-27 DIAGNOSIS — R519 Headache, unspecified: Secondary | ICD-10-CM

## 2012-12-27 DIAGNOSIS — Z79899 Other long term (current) drug therapy: Secondary | ICD-10-CM | POA: Insufficient documentation

## 2012-12-27 LAB — GLUCOSE, CAPILLARY: Glucose-Capillary: 294 mg/dL — ABNORMAL HIGH (ref 70–99)

## 2012-12-27 LAB — POCT I-STAT, CHEM 8
BUN: 10 mg/dL (ref 6–23)
Calcium, Ion: 1.12 mmol/L (ref 1.12–1.23)
Chloride: 100 mEq/L (ref 96–112)
Glucose, Bld: 338 mg/dL — ABNORMAL HIGH (ref 70–99)
HCT: 45 % (ref 39.0–52.0)
Hemoglobin: 15.3 g/dL (ref 13.0–17.0)

## 2012-12-27 MED ORDER — LORAZEPAM 1 MG PO TABS
1.0000 mg | ORAL_TABLET | Freq: Once | ORAL | Status: AC
  Administered 2012-12-27: 1 mg via ORAL
  Filled 2012-12-27: qty 1

## 2012-12-27 MED ORDER — INSULIN ASPART 100 UNIT/ML ~~LOC~~ SOLN
5.0000 [IU] | Freq: Once | SUBCUTANEOUS | Status: AC
  Administered 2012-12-27: 5 [IU] via SUBCUTANEOUS

## 2012-12-27 MED ORDER — SODIUM CHLORIDE 0.9 % IV SOLN
500.0000 mg | Freq: Once | INTRAVENOUS | Status: AC
  Administered 2012-12-27: 500 mg via INTRAVENOUS
  Filled 2012-12-27: qty 5

## 2012-12-27 MED ORDER — OXYCODONE-ACETAMINOPHEN 5-325 MG PO TABS
1.0000 | ORAL_TABLET | Freq: Once | ORAL | Status: AC
  Administered 2012-12-27: 1 via ORAL
  Filled 2012-12-27: qty 1

## 2012-12-27 NOTE — ED Notes (Signed)
Reports seizure early this morning during sleep.  Un witnessed.

## 2012-12-27 NOTE — ED Provider Notes (Signed)
History     This chart was scribed for Melvin Gaskins, MD, MD by Smitty Pluck, ED Scribe. The patient was seen in room APA05/APA05 and the patient's care was started at 7:29 AM.   CSN: [id number]  Arrival date & time 12/27/12  0719     Chief Complaint -    Patient is a 47 y.o. male presenting with seizures. The history is provided by the patient and medical records. No language interpreter was used.  Seizures Seizure activity on arrival: no   Seizure type:  Unable to specify Preceding symptoms: headache   Initial focality:  Unable to specify Return to baseline: yes   Severity:  Moderate Timing:  Once Number of seizures this episode:  1 Progression:  Resolved Recent head injury:  No recent head injuries History of seizures: yes    HPI Comments: KAMONTE MCMICHEN is a 47 y.o. male who presents to the Emergency Department complaining of seizure activity today PTA. He states he does not remember the seizure but he awoke on the floor and thinks he hit his head during the fall. The seizure was not witnessed. He states he has a HA and very similar to prior headaches he will have with seizures. Pt states he has not taken Keppra because he ran out of the medication but he has taken the Depakote last night. Pt reports that he had visual and auditory hallucinations last night but that this has been ongoing and is not a new phenomenon and is being treated as an outpatient. Pt denies incontinence, biting tongue, neck pain, fever, chills, nausea, vomiting, diarrhea, weakness, cough, SOB and any other pain.  Dr. Gerilyn Pilgrim   Past Medical History  Diagnosis Date  . Seizures   . Anxiety   . Depression   . Diabetes mellitus   . HTN (hypertension)   . Sleep apnea   . Bipolar 1 disorder   . Insomnia   . [id number](478.2)     Past Surgical History  Procedure Laterality Date  . Mandible fracture surgery    . Knee surgery    . Tonsillectomy      Family History  Problem Relation Age of  Onset  . Arthritis    . Diabetes    . Asthma      History  Substance Use Topics  . Smoking status: Current Every Day Smoker -- 1.00 packs/day for 30 years    Types: Cigarettes  . Smokeless tobacco: Never Used  . Alcohol Use: No      Review of Systems  Constitutional: Negative for fever and chills.  Neurological: Positive for seizures and headaches.  Psychiatric/Behavioral: Negative for confusion.  All other systems reviewed and are negative.    Allergies  Aspirin  Home Medications   Current Outpatient Rx  Name  Route  Sig  Dispense  Refill  . albuterol (PROVENTIL HFA;VENTOLIN HFA) 108 (90 BASE) MCG/ACT inhaler   Inhalation   Inhale 2 puffs into the lungs every 6 (six) hours as needed. For shortness of breath         . alprazolam (XANAX) 2 MG tablet   Oral   Take 2 mg by mouth 4 (four) times daily.          Marland Kitchen amLODipine (NORVASC) 10 MG tablet   Oral   Take 10 mg by mouth daily.           Marland Kitchen atenolol (TENORMIN) 100 MG tablet   Oral   Take 100 mg by mouth daily.           Marland Kitchen  carisoprodol (SOMA) 350 MG tablet   Oral   Take 350 mg by mouth 3 (three) times daily.         . diflunisal (DOLOBID) 500 MG TABS   Oral   Take 500 mg by mouth every evening. Seizures         . divalproex (DEPAKOTE) 500 MG EC tablet   Oral   Take 1,000-1,500 mg by mouth at bedtime.          . DULoxetine (CYMBALTA) 60 MG capsule   Oral   Take 60 mg by mouth at bedtime.          . hydrochlorothiazide (HYDRODIURIL) 25 MG tablet   Oral   Take 25 mg by mouth daily.         . insulin glargine (LANTUS) 100 UNIT/ML injection   Subcutaneous   Inject 40 Units into the skin at bedtime.          . levETIRAcetam (KEPPRA) 500 MG tablet   Oral   Take 500 mg by mouth 2 (two) times daily.         Marland Kitchen lisinopril-hydrochlorothiazide (PRINZIDE,ZESTORETIC) 20-25 MG per tablet   Oral   Take 1 tablet by mouth daily.         . metFORMIN (GLUCOPHAGE) 1000 MG tablet   Oral    Take 1,000 mg by mouth 2 (two) times daily.           Marland Kitchen oxyCODONE-acetaminophen (PERCOCET) 5-325 MG per tablet   Oral   Take 2 tablets by mouth every 4 (four) hours as needed for pain.   20 tablet   0   . oxyCODONE-acetaminophen (PERCOCET/ROXICET) 5-325 MG per tablet   Oral   Take 1 tablet by mouth every 4 (four) hours as needed. For pain         . promethazine (PHENERGAN) 25 MG tablet   Oral   Take 1 tablet (25 mg total) by mouth every 6 (six) hours as needed for nausea.   20 tablet   0     BP 127/94  Temp(Src) 98.5 F (36.9 C) (Oral)  Resp 16  Ht 5\' 7"  (1.702 m)  Wt 150 lb (68.04 kg)  BMI 23.49 kg/m2  SpO2 99%  Physical Exam  Nursing note and vitals reviewed.  CONSTITUTIONAL: Well developed/well nourished HEAD: Normocephalic/atraumatic, no visible signs of trauma EYES: EOMI/PERRL ENMT: Mucous membranes moist, no tongue laceration, No evidence of facial/nasal trauma NECK: supple no meningeal signs SPINE:entire spine nontender, No bruising/crepitance/stepoffs noted to spine NEXUS criteria met CV: S1/S2 noted, no murmurs/rubs/gallops noted LUNGS: Lungs are clear to auscultation bilaterally, no apparent distress ABDOMEN: soft, nontender, no rebound or guarding GU:no cva tenderness NEURO: Pt is awake/alert, moves all extremitiesx4, GCS 15, ambulatory without ataxia on my assessment No focal weakness noted in his extremities EXTREMITIES: pulses normal, full ROM, no deformity or tenderness noted  SKIN: warm, color normal PSYCH: no abnormalities of mood noted  ED Course  Procedures  DIAGNOSTIC STUDIES: Oxygen Saturation is 99% on room air, normal by my interpretation.    COORDINATION OF CARE: 7:36 AM Discussed ED treatment with pt and pt agrees.  7:36 AM Ordered:  Medications  insulin aspart (novoLOG) injection 5 Units (not administered)  levETIRAcetam (KEPPRA) 500 mg in sodium chloride 0.9 % 100 mL IVPB (500 mg Intravenous New Bag/Given 12/27/12 0756)   LORazepam (ATIVAN) tablet 1 mg (1 mg Oral Given 12/27/12 0747)    Pt with long h/o seizures Reports he is out keppra  and took last dose of depakote last night Dose of keppra given here He reports he is to have both meds refilled tomorrow No signs of trauma from seizures and reports HA similar to prior after seizure, defer neuroimaging Does not appear to be in DKA - no vomiting, no distress noted One dose of insulin given here I advised need to continue his AED therapy and f/u as outpatient For his report hallucinations he reports this is not new and is chronic and he does not appear psychotic or a harm to himself Seizure precautions given to patient     MDM  Nursing notes including past medical history and social history reviewed and considered in documentation Labs/vital reviewed and considered      Date: 12/27/2012  Rate: 67  Rhythm: normal sinus rhythm  QRS Axis: left  Intervals: normal  ST/T Wave abnormalities: normal  Conduction Disutrbances:none  Narrative Interpretation:   Old EKG Reviewed: unchanged    I personally performed the services described in this documentation, which was scribed in my presence. The recorded information has been reviewed and is accurate.      Melvin Gaskins, MD 12/27/12 [phone]

## 2012-12-31 ENCOUNTER — Encounter (HOSPITAL_COMMUNITY): Payer: Self-pay | Admitting: *Deleted

## 2012-12-31 ENCOUNTER — Emergency Department (HOSPITAL_COMMUNITY)
Admission: EM | Admit: 2012-12-31 | Discharge: 2013-01-01 | Disposition: A | Discharge status: Home / Self Care | Payer: MEDICAID | Attending: Emergency Medicine | Admitting: Emergency Medicine

## 2012-12-31 ENCOUNTER — Emergency Department (HOSPITAL_COMMUNITY): Payer: MEDICAID

## 2012-12-31 DIAGNOSIS — F209 Schizophrenia, unspecified: Secondary | ICD-10-CM

## 2012-12-31 DIAGNOSIS — E119 Type 2 diabetes mellitus without complications: Secondary | ICD-10-CM | POA: Insufficient documentation

## 2012-12-31 DIAGNOSIS — G4730 Sleep apnea, unspecified: Secondary | ICD-10-CM | POA: Insufficient documentation

## 2012-12-31 DIAGNOSIS — R569 Unspecified convulsions: Secondary | ICD-10-CM

## 2012-12-31 DIAGNOSIS — Z794 Long term (current) use of insulin: Secondary | ICD-10-CM | POA: Insufficient documentation

## 2012-12-31 DIAGNOSIS — H5316 Psychophysical visual disturbances: Secondary | ICD-10-CM | POA: Insufficient documentation

## 2012-12-31 DIAGNOSIS — F411 Generalized anxiety disorder: Secondary | ICD-10-CM | POA: Insufficient documentation

## 2012-12-31 DIAGNOSIS — F17200 Nicotine dependence, unspecified, uncomplicated: Secondary | ICD-10-CM | POA: Insufficient documentation

## 2012-12-31 DIAGNOSIS — G40909 Epilepsy, unspecified, not intractable, without status epilepticus: Secondary | ICD-10-CM | POA: Insufficient documentation

## 2012-12-31 DIAGNOSIS — I10 Essential (primary) hypertension: Secondary | ICD-10-CM | POA: Insufficient documentation

## 2012-12-31 DIAGNOSIS — G4700 Insomnia, unspecified: Secondary | ICD-10-CM | POA: Insufficient documentation

## 2012-12-31 DIAGNOSIS — F319 Bipolar disorder, unspecified: Secondary | ICD-10-CM | POA: Insufficient documentation

## 2012-12-31 DIAGNOSIS — Z79899 Other long term (current) drug therapy: Secondary | ICD-10-CM | POA: Insufficient documentation

## 2012-12-31 HISTORY — DX: Schizophrenia, unspecified: F20.9

## 2012-12-31 LAB — CBC WITH DIFFERENTIAL/PLATELET
Basophils Absolute: 0 10*3/uL (ref 0.0–0.1)
Basophils Relative: 0 % (ref 0–1)
Eosinophils Relative: 1 % (ref 0–5)
Lymphocytes Relative: 42 % (ref 12–46)
Neutro Abs: 3.4 10*3/uL (ref 1.7–7.7)
Platelets: 193 10*3/uL (ref 150–400)
RDW: 12.1 % (ref 11.5–15.5)
WBC: 7.3 10*3/uL (ref 4.0–10.5)

## 2012-12-31 LAB — ETHANOL: Alcohol, Ethyl (B): <11 mg/dL (ref 0–11)

## 2012-12-31 LAB — BASIC METABOLIC PANEL
Calcium: 9.3 mg/dL (ref 8.4–10.5)
Creatinine, Ser: 0.99 mg/dL (ref 0.50–1.35)
GFR calc Af Amer: >90 mL/min (ref >90)

## 2012-12-31 NOTE — ED Notes (Signed)
Visual and auditory hallucinations  Ran out of meds 3-4 weeks ago. Had a sz today

## 2012-12-31 NOTE — ED Notes (Signed)
RX bottle contents for Risperidone counted with second RN - contains 57 tablets fill date is 12/28/12.  Patient states he just go them today.

## 2012-12-31 NOTE — ED Notes (Signed)
Patient had a bottle of medication in his pocket which he showed me and states it is a new RX - used to take it but got better and quit taking it.  States he began hearing voices about two weeks ago.  States he is very tired from seizure today and has a headache. Right parietal area.  States has hx of migraines also.

## 2013-01-01 LAB — RAPID URINE DRUG SCREEN, HOSP PERFORMED
Benzodiazepines: NOT DETECTED
Cocaine: POSITIVE — AB

## 2013-01-01 NOTE — ED Provider Notes (Signed)
History     CSN: [id number]  Arrival date & time 12/31/12  2155   First MD Initiated Contact with Patient 12/31/12 2242      Chief Complaint  Patient presents with  . V70.1   Chief complaint:  Visual/ auditory hallucinations. (Consider location/radiation/quality/duration/timing/severity/associated sxs/prior treatment) HPI.... patient has long-standing psychiatric history including schizophrenia and bipolar illness. He has not been taking his medication for several weeks. He allegedly has had visual auditory hallucinations today. No suicidal or homicidal ideation. Additionally he claims to have a seizure today. He has a history of seizure disorder. He is not appearing postictal.  He has not been going to his outpatient appointments.  Level V caveat for mental illness.  Past Medical History  Diagnosis Date  . Seizures   . Anxiety   . Depression   . Diabetes mellitus   . HTN (hypertension)   . Sleep apnea   . Bipolar 1 disorder   . Insomnia   . Headache(784.0)   . Schizophrenia     Past Surgical History  Procedure Laterality Date  . Mandible fracture surgery    . Knee surgery    . Tonsillectomy      Family History  Problem Relation Age of Onset  . Arthritis    . Diabetes    . Asthma      History  Substance Use Topics  . Smoking status: Current Every Day Smoker -- 1.00 packs/day for 30 years    Types: Cigarettes  . Smokeless tobacco: Never Used  . Alcohol Use: No      Review of Systems  Unable to perform ROS: Psychiatric disorder    Allergies  Aspirin  Home Medications   Current Outpatient Rx  Name  Route  Sig  Dispense  Refill  . amLODipine (NORVASC) 10 MG tablet   Oral   Take 10 mg by mouth daily.           Marland Kitchen atenolol (TENORMIN) 100 MG tablet   Oral   Take 100 mg by mouth daily.           . carisoprodol (SOMA) 350 MG tablet   Oral   Take 350 mg by mouth 3 (three) times daily.         . diflunisal (DOLOBID) 500 MG TABS   Oral   Take  500 mg by mouth every evening. Seizures         . divalproex (DEPAKOTE) 500 MG EC tablet   Oral   Take 500 mg by mouth 3 (three) times daily.          . hydrochlorothiazide (HYDRODIURIL) 25 MG tablet   Oral   Take 25 mg by mouth daily.         . insulin glargine (LANTUS) 100 UNIT/ML injection   Subcutaneous   Inject 70 Units into the skin every morning.          . levETIRAcetam (KEPPRA) 500 MG tablet   Oral   Take 500 mg by mouth 2 (two) times daily.         Marland Kitchen lisinopril-hydrochlorothiazide (PRINZIDE,ZESTORETIC) 20-25 MG per tablet   Oral   Take 1 tablet by mouth daily.         . metFORMIN (GLUCOPHAGE) 1000 MG tablet   Oral   Take 1,000 mg by mouth 2 (two) times daily.           . risperiDONE (RISPERDAL) 2 MG tablet   Oral   Take 2 mg by mouth  2 (two) times daily. States just got this RX filled today shortly beofre coming to ED.  Took two tonight before checking in to ED         . albuterol (PROVENTIL HFA;VENTOLIN HFA) 108 (90 BASE) MCG/ACT inhaler   Inhalation   Inhale 2 puffs into the lungs every 6 (six) hours as needed. For shortness of breath         . alprazolam (XANAX) 2 MG tablet   Oral   Take 2 mg by mouth 4 (four) times daily.            BP 99/64  Pulse 98  Temp(Src) 97 F (36.1 C) (Oral)  Resp 20  Ht 5\' 6"  (1.676 m)  Wt 150 lb (68.04 kg)  BMI 24.22 kg/m2  SpO2 99%  Physical Exam  Nursing note and vitals reviewed. Constitutional: He is oriented to person, place, and time. He appears well-developed and well-nourished.  HENT:  Head: Normocephalic and atraumatic.  Eyes: Conjunctivae and EOM are normal. Pupils are equal, round, and reactive to light.  Neck: Normal range of motion. Neck supple.  Cardiovascular: Normal rate, regular rhythm and normal heart sounds.   Pulmonary/Chest: Effort normal and breath sounds normal.  Abdominal: Soft. Bowel sounds are normal.  Musculoskeletal: Normal range of motion.  Neurological: He is alert  and oriented to person, place, and time.  No neurological deficits or postictal behavior  Skin: Skin is warm and dry.  Psychiatric:  Flat affect    ED Course  Procedures (including critical care time)  Labs Reviewed  BASIC METABOLIC PANEL - Abnormal; Notable for the following:    Glucose, Bld 370 (*)    All other components within normal limits  CBC WITH DIFFERENTIAL - Abnormal; Notable for the following:    MCHC 37.6 (*)    All other components within normal limits  VALPROIC ACID LEVEL - Abnormal; Notable for the following:    Valproic Acid Lvl <10.0 (*)    All other components within normal limits  ETHANOL  URINE RAPID DRUG SCREEN (HOSP PERFORMED)   No results found.   No diagnosis found.    MDM  Will obtain tele-psychiatry consultation.   CT head pending at this time.  Discussed with Dr Eulis Manly, MD 01/01/13 [phone]

## 2013-01-01 NOTE — ED Provider Notes (Signed)
0020 Assumed care/disposition of patient. He is awaiting CT of his head and a tele psych consultation to determine if he is at his baseline.  1610 CT is negative for acute injury. Telepsych with Dr. Jacky Kindle found the patient to be at baseline and without need for psychiatric admission. Patient had a seizure earlier in the day and had just begun his medicines Saturday.  Reviewed information with the patient. He will be discharged home.    Ct Head Wo Contrast  01/01/2013   *RADIOLOGY REPORT*  Clinical Data: Seizure.  Injury to the back the head.  Frontal headaches.  CT HEAD WITHOUT CONTRAST  Technique:  Contiguous axial images were obtained from the base of the skull through the vertex without contrast.  Comparison: Head CT 04/06/2012.  Findings: No acute displaced skull fractures are identified.  No acute intracranial abnormality.  Specifically, no evidence of acute post-traumatic intracranial hemorrhage, no definite regions of acute/subacute cerebral ischemia, no focal mass, mass effect, hydrocephalus or abnormal intra or extra-axial fluid collections. The visualized paranasal sinuses and mastoids are well pneumatized.  IMPRESSION: 1.  No acute displaced skull fractures or acute intracranial abnormalities. 2.  The appearance of the brain is normal.   Original Report Authenticated By: Trudie Reed, M.D.    Nicoletta Dress. Colon Branch, MD 01/01/13 [phone]

## 2013-01-01 NOTE — ED Notes (Signed)
Tele-psych consult completed.    Patient voided and specimen sent to lab.

## 2013-01-01 NOTE — ED Notes (Signed)
Patient called his "Manager" to come pick him up- I spoke with her and she is en route now.

## 2013-01-01 NOTE — ED Notes (Signed)
Patient is aware of need for urine specimen - states he cannot void right now.

## 2013-01-05 ENCOUNTER — Encounter (HOSPITAL_COMMUNITY): Payer: Self-pay | Admitting: *Deleted

## 2013-01-05 ENCOUNTER — Emergency Department (HOSPITAL_COMMUNITY)
Admission: EM | Admit: 2013-01-05 | Discharge: 2013-01-05 | Disposition: A | Discharge status: Home / Self Care | Payer: Medicaid Other | Attending: Emergency Medicine | Admitting: Emergency Medicine

## 2013-01-05 DIAGNOSIS — Z79899 Other long term (current) drug therapy: Secondary | ICD-10-CM | POA: Insufficient documentation

## 2013-01-05 DIAGNOSIS — G40909 Epilepsy, unspecified, not intractable, without status epilepticus: Secondary | ICD-10-CM | POA: Insufficient documentation

## 2013-01-05 DIAGNOSIS — R569 Unspecified convulsions: Secondary | ICD-10-CM

## 2013-01-05 DIAGNOSIS — F209 Schizophrenia, unspecified: Secondary | ICD-10-CM | POA: Insufficient documentation

## 2013-01-05 DIAGNOSIS — I10 Essential (primary) hypertension: Secondary | ICD-10-CM | POA: Insufficient documentation

## 2013-01-05 DIAGNOSIS — F411 Generalized anxiety disorder: Secondary | ICD-10-CM | POA: Insufficient documentation

## 2013-01-05 DIAGNOSIS — F319 Bipolar disorder, unspecified: Secondary | ICD-10-CM | POA: Insufficient documentation

## 2013-01-05 DIAGNOSIS — F17200 Nicotine dependence, unspecified, uncomplicated: Secondary | ICD-10-CM | POA: Insufficient documentation

## 2013-01-05 DIAGNOSIS — G4700 Insomnia, unspecified: Secondary | ICD-10-CM | POA: Insufficient documentation

## 2013-01-05 DIAGNOSIS — Z794 Long term (current) use of insulin: Secondary | ICD-10-CM | POA: Insufficient documentation

## 2013-01-05 DIAGNOSIS — E119 Type 2 diabetes mellitus without complications: Secondary | ICD-10-CM | POA: Insufficient documentation

## 2013-01-05 LAB — BASIC METABOLIC PANEL
BUN: 9 mg/dL (ref 6–23)
CO2: 27 mEq/L (ref 19–32)
Calcium: 9.1 mg/dL (ref 8.4–10.5)
Creatinine, Ser: 0.83 mg/dL (ref 0.50–1.35)
GFR calc non Af Amer: >90 mL/min (ref >90)
Glucose, Bld: 206 mg/dL — ABNORMAL HIGH (ref 70–99)
Sodium: 137 mEq/L (ref 135–145)

## 2013-01-05 LAB — CBC WITH DIFFERENTIAL/PLATELET
Eosinophils Absolute: 0 10*3/uL (ref 0.0–0.7)
Eosinophils Relative: 1 % (ref 0–5)
HCT: 42.7 % (ref 39.0–52.0)
Lymphocytes Relative: 37 % (ref 12–46)
Lymphs Abs: 2.6 10*3/uL (ref 0.7–4.0)
MCH: 32.4 pg (ref 26.0–34.0)
MCV: 88.6 fL (ref 78.0–100.0)
Monocytes Absolute: 0.6 10*3/uL (ref 0.1–1.0)
Platelets: 202 10*3/uL (ref 150–400)
RBC: 4.82 MIL/uL (ref 4.22–5.81)
WBC: 7 10*3/uL (ref 4.0–10.5)

## 2013-01-05 MED ORDER — LORAZEPAM 1 MG PO TABS: 1.0000 mg | ORAL_TABLET | Freq: Four times a day (QID) | ORAL | Status: DC | PRN

## 2013-01-05 MED ORDER — LEVETIRACETAM 500 MG PO TABS
500.0000 mg | ORAL_TABLET | Freq: Every day | ORAL | Status: DC
  Administered 2013-01-05: 500 mg via ORAL
  Filled 2013-01-05 (×2): qty 1

## 2013-01-05 MED ORDER — ACETAMINOPHEN 500 MG PO TABS
1000.0000 mg | ORAL_TABLET | Freq: Once | ORAL | Status: DC
  Filled 2013-01-05: qty 2

## 2013-01-05 NOTE — ED Notes (Signed)
Pt also states that he has not taken his keppra in a week also, has prescription but can not get it filled until June 12,

## 2013-01-05 NOTE — ED Notes (Signed)
Pt presents to er with c/o seizure, pt has hx of seizure and has been out of his medication Depakote for the past 4-5 days, did speak with Dr. Gerilyn Pilgrim yesterday, had additional prescription called in, pt states that he did take yesterday's and today's dose, normal dose is Depakote 500 mg TID. Pt alert, oriented on arrival to er, did bite his tongue on right side, no bleeding noted, reports that he was incontinent during the seizure, pt states " i think i had seizure today because i am stressed because my cousin is being buried today", pt states that he thinks his seizures lasted about 45 minutes this am, he is not sure.

## 2013-01-05 NOTE — ED Notes (Signed)
Dr Cook at bedside,  

## 2013-01-05 NOTE — ED Notes (Signed)
Pt refused Tylenol stating, "That isn't gonna do any good. I took one this morning."

## 2013-01-05 NOTE — ED Notes (Signed)
Pt c/o headache, states "I get a headache after my seizures"

## 2013-01-05 NOTE — ED Provider Notes (Signed)
History    This chart was scribed for Donnetta Hutching, MD by Sofie Rower, ED Scribe. The patient was seen in room [facility] and the patient's care was started at 11:34AM.    CSN: [id number]  Arrival date & time 01/05/13  1106   First MD Initiated Contact with Patient 01/05/13 1134      Chief Complaint  Patient presents with  . Seizures    (Consider location/radiation/quality/duration/timing/severity/associated sxs/prior treatment) The history is provided by the patient. No language interpreter was used.    Melvin Taylor is a 47 y.o. male , with a hx of seizures, anxiety, diabetes mellitus, hypertension, sleep apnea, bipolar 1 disorder, insomnia, and schizophrenia, who presents to the Emergency Department complaining of sudden, moderate, seizure, onset today (01/05/13). The pt reports he has a cousin whom recently died from lung cancer, which he believes is contributing to his current level of stress and possibly triggering his seizure like activity. The pt informs he awoke this morning, where he immediately noticed he had bit the right side of his tongue and urinated on himself overnight. The pt has been taking Depakote 500 mg (X 3/day) and Kepra 500 mg (X 1/day), however, he informs he has missed his Keppra dosage today, and cannot obtain his regularly scheduled medications until 01/10/13.   The pt is a current everyday smoker, however, he does not drink alcohol.   PCP is Dr. Felecia Shelling.    Past Medical History  Diagnosis Date  . Seizures   . Anxiety   . Depression   . Diabetes mellitus   . HTN (hypertension)   . Sleep apnea   . Bipolar 1 disorder   . Insomnia   . Headache(784.0)   . Schizophrenia     Past Surgical History  Procedure Laterality Date  . Mandible fracture surgery    . Knee surgery    . Tonsillectomy      Family History  Problem Relation Age of Onset  . Arthritis    . Diabetes    . Asthma      History  Substance Use Topics  . Smoking status: Current Every  Day Smoker -- 1.00 packs/day for 30 years    Types: Cigarettes  . Smokeless tobacco: Never Used  . Alcohol Use: No      Review of Systems  All other systems reviewed and are negative.    Allergies  Aspirin  Home Medications   Current Outpatient Rx  Name  Route  Sig  Dispense  Refill  . albuterol (PROVENTIL HFA;VENTOLIN HFA) 108 (90 BASE) MCG/ACT inhaler   Inhalation   Inhale 2 puffs into the lungs every 6 (six) hours as needed. For shortness of breath         . alprazolam (XANAX) 2 MG tablet   Oral   Take 2 mg by mouth 4 (four) times daily.          Marland Kitchen amLODipine (NORVASC) 10 MG tablet   Oral   Take 10 mg by mouth daily.           Marland Kitchen atenolol (TENORMIN) 100 MG tablet   Oral   Take 100 mg by mouth daily.           . carisoprodol (SOMA) 350 MG tablet   Oral   Take 350 mg by mouth 3 (three) times daily.         . diflunisal (DOLOBID) 500 MG TABS   Oral   Take 500 mg by mouth every evening. Seizures         .  divalproex (DEPAKOTE) 500 MG EC tablet   Oral   Take 500 mg by mouth 3 (three) times daily.          . hydrochlorothiazide (HYDRODIURIL) 25 MG tablet   Oral   Take 25 mg by mouth daily.         . insulin glargine (LANTUS) 100 UNIT/ML injection   Subcutaneous   Inject 70 Units into the skin every morning.          . levETIRAcetam (KEPPRA) 500 MG tablet   Oral   Take 500 mg by mouth 2 (two) times daily.         Marland Kitchen lisinopril-hydrochlorothiazide (PRINZIDE,ZESTORETIC) 20-25 MG per tablet   Oral   Take 1 tablet by mouth daily.         . metFORMIN (GLUCOPHAGE) 1000 MG tablet   Oral   Take 1,000 mg by mouth 2 (two) times daily.           . risperiDONE (RISPERDAL) 2 MG tablet   Oral   Take 2 mg by mouth 2 (two) times daily. States just got this RX filled today shortly beofre coming to ED.  Took two tonight before checking in to ED           BP 112/80  Pulse 94  Temp(Src) 98.2 F (36.8 C) (Oral)  Resp 18  SpO2  96%  Physical Exam  Nursing note and vitals reviewed. Constitutional: He is oriented to person, place, and time. He appears well-developed and well-nourished.  HENT:  Head: Normocephalic and atraumatic.  Eyes: Conjunctivae and EOM are normal. Pupils are equal, round, and reactive to light.  Neck: Normal range of motion. Neck supple.  Cardiovascular: Normal rate, regular rhythm and normal heart sounds.   Pulmonary/Chest: Effort normal and breath sounds normal.  Abdominal: Soft. Bowel sounds are normal.  Musculoskeletal: Normal range of motion.  Neurological: He is alert and oriented to person, place, and time.  Skin: Skin is warm and dry.  Psychiatric: He has a normal mood and affect.    ED Course  Procedures (including critical care time)  DIAGNOSTIC STUDIES: Oxygen Saturation is 96% on room air, normal by my interpretation.    COORDINATION OF CARE:  11:53AM- Treatment plan discussed with patient. Pt agrees with treatment.     Results for orders placed during the hospital encounter of 01/05/13  CBC WITH DIFFERENTIAL      Result Value Range   WBC 7.0  4.0 - 10.5 K/uL   RBC 4.82  4.22 - 5.81 MIL/uL   Hemoglobin 15.6  13.0 - 17.0 g/dL   HCT 16.1  09.6 - 04.5 %   MCV 88.6  78.0 - 100.0 fL   MCH 32.4  26.0 - 34.0 pg   MCHC 36.5 (*) 30.0 - 36.0 g/dL   RDW 40.9  81.1 - 91.4 %   Platelets 202  150 - 400 K/uL   Neutrophils Relative % 55  43 - 77 %   Neutro Abs 3.8  1.7 - 7.7 K/uL   Lymphocytes Relative 37  12 - 46 %   Lymphs Abs 2.6  0.7 - 4.0 K/uL   Monocytes Relative 8  3 - 12 %   Monocytes Absolute 0.6  0.1 - 1.0 K/uL   Eosinophils Relative 1  0 - 5 %   Eosinophils Absolute 0.0  0.0 - 0.7 K/uL   Basophils Relative 0  0 - 1 %   Basophils Absolute 0.0  0.0 - 0.1 K/uL  BASIC METABOLIC PANEL      Result Value Range   Sodium 137  135 - 145 mEq/L   Potassium 3.7  3.5 - 5.1 mEq/L   Chloride 98  96 - 112 mEq/L   CO2 27  19 - 32 mEq/L   Glucose, Bld 206 (*) 70 - 99 mg/dL    BUN 9  6 - 23 mg/dL   Creatinine, Ser 1.61  0.50 - 1.35 mg/dL   Calcium 9.1  8.4 - 09.6 mg/dL   GFR calc non Af Amer >90  >90 mL/min   GFR calc Af Amer >90  >90 mL/min  VALPROIC ACID LEVEL      Result Value Range   Valproic Acid Lvl 108.0 (*) 50.0 - 100.0 ug/mL  GLUCOSE, CAPILLARY      Result Value Range   Glucose-Capillary 161 (*) 70 - 99 mg/dL        No results found.   No diagnosis found.    MDM  Patient has known history of seizure disorder.  Will prescribe Ativan by mouth until he is able to refill his prescription.   He has just restarted his Depakote.   No seizure or neuro deficits noted in the emergency department      I personally performed the services described in this documentation, which was scribed in my presence. The recorded information has been reviewed and is accurate.    Donnetta Hutching, MD 01/05/13 1444

## 2013-05-15 ENCOUNTER — Emergency Department (HOSPITAL_COMMUNITY)
Admission: EM | Admit: 2013-05-15 | Discharge: 2013-05-15 | Disposition: A | Discharge status: Home / Self Care | Payer: Medicaid Other | Attending: Emergency Medicine | Admitting: Emergency Medicine

## 2013-05-15 ENCOUNTER — Encounter (HOSPITAL_COMMUNITY): Payer: Self-pay | Admitting: Emergency Medicine

## 2013-05-15 DIAGNOSIS — F17200 Nicotine dependence, unspecified, uncomplicated: Secondary | ICD-10-CM | POA: Insufficient documentation

## 2013-05-15 DIAGNOSIS — G40909 Epilepsy, unspecified, not intractable, without status epilepticus: Secondary | ICD-10-CM | POA: Insufficient documentation

## 2013-05-15 DIAGNOSIS — Z794 Long term (current) use of insulin: Secondary | ICD-10-CM | POA: Insufficient documentation

## 2013-05-15 DIAGNOSIS — E860 Dehydration: Secondary | ICD-10-CM

## 2013-05-15 DIAGNOSIS — F411 Generalized anxiety disorder: Secondary | ICD-10-CM | POA: Insufficient documentation

## 2013-05-15 DIAGNOSIS — F3130 Bipolar disorder, current episode depressed, mild or moderate severity, unspecified: Secondary | ICD-10-CM | POA: Insufficient documentation

## 2013-05-15 DIAGNOSIS — E119 Type 2 diabetes mellitus without complications: Secondary | ICD-10-CM | POA: Insufficient documentation

## 2013-05-15 DIAGNOSIS — R739 Hyperglycemia, unspecified: Secondary | ICD-10-CM

## 2013-05-15 DIAGNOSIS — Z79899 Other long term (current) drug therapy: Secondary | ICD-10-CM | POA: Insufficient documentation

## 2013-05-15 DIAGNOSIS — F209 Schizophrenia, unspecified: Secondary | ICD-10-CM | POA: Insufficient documentation

## 2013-05-15 DIAGNOSIS — I10 Essential (primary) hypertension: Secondary | ICD-10-CM | POA: Insufficient documentation

## 2013-05-15 LAB — BASIC METABOLIC PANEL
BUN: 15 mg/dL (ref 6–23)
Calcium: 10 mg/dL (ref 8.4–10.5)
Chloride: 95 mEq/L — ABNORMAL LOW (ref 96–112)
Creatinine, Ser: 1.07 mg/dL (ref 0.50–1.35)
GFR calc Af Amer: >90 mL/min (ref >90)

## 2013-05-15 LAB — CBC WITH DIFFERENTIAL/PLATELET
Basophils Absolute: 0 10*3/uL (ref 0.0–0.1)
Basophils Relative: 0 % (ref 0–1)
Eosinophils Relative: 0 % (ref 0–5)
HCT: 44.8 % (ref 39.0–52.0)
Hemoglobin: 16.5 g/dL (ref 13.0–17.0)
MCH: 31.9 pg (ref 26.0–34.0)
MCHC: 36.8 g/dL — ABNORMAL HIGH (ref 30.0–36.0)
MCV: 86.7 fL (ref 78.0–100.0)
Monocytes Absolute: 0.4 10*3/uL (ref 0.1–1.0)
Monocytes Relative: 6 % (ref 3–12)
Neutro Abs: 3 10*3/uL (ref 1.7–7.7)
RDW: 12.5 % (ref 11.5–15.5)

## 2013-05-15 LAB — URINE MICROSCOPIC-ADD ON

## 2013-05-15 LAB — URINALYSIS, ROUTINE W REFLEX MICROSCOPIC
Bilirubin Urine: NEGATIVE
Glucose, UA: >1000 mg/dL — AB
Ketones, ur: 15 mg/dL — AB
Nitrite: NEGATIVE
Protein, ur: NEGATIVE mg/dL
pH: 6 (ref 5.0–8.0)

## 2013-05-15 MED ORDER — INSULIN GLARGINE 100 UNIT/ML ~~LOC~~ SOLN: 70.0000 [IU] | Freq: Every day | SUBCUTANEOUS | Status: DC

## 2013-05-15 MED ORDER — INSULIN ASPART 100 UNIT/ML ~~LOC~~ SOLN
10.0000 [IU] | Freq: Once | SUBCUTANEOUS | Status: AC
  Administered 2013-05-15: 10 [IU] via SUBCUTANEOUS
  Filled 2013-05-15: qty 1

## 2013-05-15 MED ORDER — SODIUM CHLORIDE 0.9 % IV BOLUS (SEPSIS)
1000.0000 mL | Freq: Once | INTRAVENOUS | Status: AC
  Administered 2013-05-15: 1000 mL via INTRAVENOUS

## 2013-05-15 MED ORDER — SODIUM CHLORIDE 0.9 % IV SOLN
Freq: Once | INTRAVENOUS | Status: AC
  Administered 2013-05-15: 13:00:00 via INTRAVENOUS

## 2013-05-15 NOTE — ED Provider Notes (Signed)
CSN: [id number]     Arrival date & time 05/15/13  1153 History  This chart was scribed for Melvin Roller, MD by Quintella Reichert, ED scribe.  This patient was seen in room [facility]12[facility] and the patient's care was started at 1:01 PM.   Chief Complaint  Patient presents with  . Hyperglycemia    The history is provided by the patient. No language interpreter was used.    HPI Comments: Melvin Taylor is a 47 y.o. male with h/o DM who presents to the Emergency Department complaining of 3-4 days of acute-onset progressively-worsening hyperglycemia.  Pt manages his DM with metformin and two types of short-acting insulin injections which he normally uses 3x/day, but he ran out of his nightly Lantus 3-4 days ago.  His CBG has been gradually rising since then and he has developed polyuria, polydipsia, and chills.  Yesterday he checked his CBG and found it to be 459.  He also woke up feeling lightheaded this morning and when he checked his CBG today it was over 500.  He denies abdominal pain, CP, SOB, blurred vision, feverm, nausea, leg swelling, or rash.  Pt also complains of CP last week which went away spontaneously and he is not currently having it.  Pt also complains of headaches but he states this is a chronic issue.  In addition he complains of chronic back pain with associated sharp pain radiating into left leg, which is being managed by his neurologist.   Past Medical History  Diagnosis Date  . Seizures   . Anxiety   . Depression   . Diabetes mellitus   . HTN (hypertension)   . Sleep apnea   . Bipolar 1 disorder   . Insomnia   . Headache(784.0)   . Schizophrenia     Past Surgical History  Procedure Laterality Date  . Mandible fracture surgery    . Knee surgery    . Tonsillectomy      Family History  Problem Relation Age of Onset  . Arthritis    . Diabetes    . Asthma      History  Substance Use Topics  . Smoking status: Current Every Day Smoker -- 1.00 packs/day for 30  years    Types: Cigarettes  . Smokeless tobacco: Never Used  . Alcohol Use: No     Review of Systems  All other systems reviewed and are negative.     Allergies  Aspirin  Home Medications   Current Outpatient Rx  Name  Route  Sig  Dispense  Refill  . albuterol (PROVENTIL HFA;VENTOLIN HFA) 108 (90 BASE) MCG/ACT inhaler   Inhalation   Inhale 2 puffs into the lungs every 6 (six) hours as needed. For shortness of breath         . alprazolam (XANAX) 2 MG tablet   Oral   Take 2 mg by mouth 3 (three) times daily as needed for anxiety.          Marland Kitchen amLODipine (NORVASC) 10 MG tablet   Oral   Take 10 mg by mouth daily.          Marland Kitchen atenolol (TENORMIN) 100 MG tablet   Oral   Take 100 mg by mouth daily.           . carisoprodol (SOMA) 350 MG tablet   Oral   Take 350 mg by mouth 2 (two) times daily.          . divalproex (DEPAKOTE) 500 MG EC  tablet   Oral   Take 500 mg by mouth 3 (three) times daily.          . insulin glargine (LANTUS) 100 UNIT/ML injection   Subcutaneous   Inject 70 Units into the skin at bedtime.          . levETIRAcetam (KEPPRA) 500 MG tablet   Oral   Take 500 mg by mouth 2 (two) times daily.         Marland Kitchen lisinopril-hydrochlorothiazide (PRINZIDE,ZESTORETIC) 20-25 MG per tablet   Oral   Take 1 tablet by mouth daily.         . metFORMIN (GLUCOPHAGE) 1000 MG tablet   Oral   Take 1,000 mg by mouth 2 (two) times daily.           . risperiDONE (RISPERDAL) 2 MG tablet   Oral   Take 2 mg by mouth 2 (two) times daily.          . hydrochlorothiazide (HYDRODIURIL) 25 MG tablet   Oral   Take 25 mg by mouth daily.         . insulin glargine (LANTUS) 100 UNIT/ML injection   Subcutaneous   Inject 0.7 mLs (70 Units total) into the skin at bedtime.   10 mL   12    BP 122/93  Pulse 81  Temp(Src) 98.1 F (36.7 C) (Oral)  Resp 20  Ht 5\' 6"  (1.676 m)  Wt 180 lb (81.647 kg)  BMI 29.07 kg/m2  SpO2 99%  Physical Exam  Nursing note  and vitals reviewed. Constitutional: He is oriented to person, place, and time. He appears well-developed and well-nourished. No distress.  HENT:  Head: Normocephalic and atraumatic.  Mouth/Throat: Oropharynx is clear and moist.  Eyes: Conjunctivae are normal. Pupils are equal, round, and reactive to light. No scleral icterus.  Neck: Neck supple.  Cardiovascular: Normal rate, regular rhythm, normal heart sounds and intact distal pulses.   No murmur heard. Pulmonary/Chest: Effort normal and breath sounds normal. No stridor. No respiratory distress. He has no wheezes. He has no rales.  Abdominal: Soft. He exhibits no distension. There is no tenderness.  Musculoskeletal: Normal range of motion. He exhibits no edema.  Neurological: He is alert and oriented to person, place, and time.  Skin: Skin is warm and dry. No rash noted.  Psychiatric: He has a normal mood and affect. His behavior is normal.    ED Course  Procedures (including critical care time)  DIAGNOSTIC STUDIES: Oxygen Saturation is 99% on room air, normal by my interpretation.    COORDINATION OF CARE: 1:05 PM: Discussed treatment plan which includes IV fluids, insulin injection, EKG, and labs.  Pt expressed understanding and agreed to plan.   Labs Review Labs Reviewed  CBC WITH DIFFERENTIAL - Abnormal; Notable for the following:    MCHC 36.8 (*)    Lymphocytes Relative 50 (*)    All other components within normal limits  BASIC METABOLIC PANEL - Abnormal; Notable for the following:    Sodium 134 (*)    Chloride 95 (*)    Glucose, Bld 345 (*)    GFR calc non Af Amer 80 (*)    All other components within normal limits  URINALYSIS, ROUTINE W REFLEX MICROSCOPIC - Abnormal; Notable for the following:    Glucose, UA >1000 (*)    Ketones, ur 15 (*)    All other components within normal limits  GLUCOSE, CAPILLARY - Abnormal; Notable for the following:    Glucose-Capillary 313 (*)  All other components within normal limits   GLUCOSE, CAPILLARY - Abnormal; Notable for the following:    Glucose-Capillary 347 (*)    All other components within normal limits  GLUCOSE, CAPILLARY - Abnormal; Notable for the following:    Glucose-Capillary 180 (*)    All other components within normal limits  URINE MICROSCOPIC-ADD ON   Imaging Review No results found.  EKG Interpretation     Ventricular Rate:  75 PR Interval:  176 QRS Duration: 84 QT Interval:  392 QTC Calculation: 437 R Axis:   -60 Text Interpretation:  Normal sinus rhythm Left axis deviation Cannot rule out Inferior infarct , age undetermined Abnormal ECG When compared with ECG of 27-Dec-2012 07:38, No significant change was found           ED ECG REPORT  I personally interpreted this EKG   Date: 05/15/2013   Rate: 75  Rhythm: normal sinus rhythm  QRS Axis: left  Intervals: normal  ST/T Wave abnormalities: normal  Conduction Disutrbances:none  Narrative Interpretation: possible inferior MI in the past  Old EKG Reviewed: unchanged c/w 12/27/12   MDM   1. Hyperglycemia   2. Dehydration    The patient has hyperglycemia, he does report eating ribs covered in barbecue sauce last night, he also admits not being compliant with his diet. He has already seen the neurologic specialist for his leg pain and has pending treatments with their clinic including a tens unit and a back brace. His otherwise his workup was significant for mild dehydration, his EKG was unremarkable and unchanged. The patient was given insulin subcutaneously, IV fluids and reevaluated with improvement in his blood sugar, he requests discharge immediately stating "I am just going home if you will not give me something for my leg pain."  Diarrhea affirmed that his hypoglycemia was the reason for his visit and his chronic leg pain is a chronic condition for which he can take his home medications. Again this pain  started years ago.    I personally performed the services described  in this documentation, which was scribed in my presence. The recorded information has been reviewed and is accurate.      Melvin Roller, MD 05/15/13 [phone]

## 2013-05-15 NOTE — ED Notes (Signed)
Pt presents with multiple complaints today, 1. Hyperglycemia " my sugar was over 500 on my meter, and feel sick from it"                                                                       2. Left leg pain " see [place] for my pain in my leg and back, no pain meds given"                                                                       3. Heart palpitations " sometimes I feel my heart go thump thump thump". Pt  Denies chest pain, SOB, radiation of pain and " need to seek treatment". Pt reports eating sweet BBQ ribs last night for dinner, and is not very compliant with his diet.  NAD noted at this time. Pt is not diaphoretic at this time, denies dizziness and vision changes.  Pt remains alert and oriented at this time.

## 2013-05-15 NOTE — ED Notes (Signed)
Pt reports blood sugar this am was over 500 according to his meter.  C/O headache, feeling lightheaded, and pain in left leg.

## 2013-05-27 ENCOUNTER — Ambulatory Visit: Payer: Medicaid Other | Attending: Neurology | Admitting: Sleep Medicine

## 2013-05-27 ENCOUNTER — Other Ambulatory Visit: Payer: Self-pay

## 2013-05-27 DIAGNOSIS — G4730 Sleep apnea, unspecified: Secondary | ICD-10-CM

## 2013-05-27 DIAGNOSIS — G4733 Obstructive sleep apnea (adult) (pediatric): Secondary | ICD-10-CM | POA: Insufficient documentation

## 2013-05-29 NOTE — Procedures (Signed)
[facility] Minette Manders A. Gerilyn Pilgrim, MD     [email or web address]        NAME:  AMAHRI, DENGEL NO.:  [id number]  MEDICAL RECORD NO.:  [id number]          PATIENT TYPE:  OUT  LOCATION:  SLEEP LAB                     FACILITY:  [facility]  PHYSICIAN:  Zebulun Deman A. Gerilyn Pilgrim, M.D. DATE OF BIRTH:  May 27, 1966  DATE OF STUDY:  05/27/2013                           NOCTURNAL POLYSOMNOGRAM  REFERRING PHYSICIAN:  Abdulmalik Darco A. Gerilyn Pilgrim, M.D.   INDICATION:  A 47 year old who had a previous study documented with obstructive sleep apnea syndrome.  This is a CPAP titration recording.   MEDICATIONS:  Metformin, insulin, Soma, Xanax, Depakote, atenolol, lisinopril, hydrochlorothiazide, Norvasc, and Keppra.  EPWORTH SLEEPINESS SCALE:  7.  BMI:  32.  ARCHITECTURAL SUMMARY:  The total recording time is 428 minutes.  Sleep efficiency 81%.  Sleep latency 56 minutes.  REM latency 70 minutes. Stage N1 5%, N2 57%, N3 18%, and REM sleep 20%.  RESPIRATORY SUMMARY:  Baseline oxygen saturation 97, lowest saturation 94 during REM and non-REM sleep.  The patient was placed on positive pressure starting at 6 and increased to 8.  The patient did well on pressures of 7 and 8.  We recommended pressure 7 as the lowest pressure that is effective.  LIMB MOVEMENT SUMMARY:  PLM index is 0.  ELECTROCARDIOGRAM SUMMARY:  Average heart rate is 55, with no significant arrhythmias observed.  IMPRESSION:  Obstructive sleep apnea syndrome, which responds well to a CPAP of 7.    Ethen Bannan A. Gerilyn Pilgrim, M.D.    KAD/MEDQ  D:  05/29/2013 11:21:19  T:  05/29/2013 12:05:22  Job:  [id number]

## 2013-06-18 ENCOUNTER — Encounter (HOSPITAL_COMMUNITY): Payer: Self-pay | Admitting: Emergency Medicine

## 2013-06-18 ENCOUNTER — Emergency Department (HOSPITAL_COMMUNITY)
Admission: EM | Admit: 2013-06-18 | Discharge: 2013-06-18 | Disposition: A | Discharge status: Home / Self Care | Payer: Medicaid Other | Attending: Emergency Medicine | Admitting: Emergency Medicine

## 2013-06-18 DIAGNOSIS — G4730 Sleep apnea, unspecified: Secondary | ICD-10-CM | POA: Insufficient documentation

## 2013-06-18 DIAGNOSIS — G4700 Insomnia, unspecified: Secondary | ICD-10-CM | POA: Insufficient documentation

## 2013-06-18 DIAGNOSIS — M5416 Radiculopathy, lumbar region: Secondary | ICD-10-CM

## 2013-06-18 DIAGNOSIS — Z79899 Other long term (current) drug therapy: Secondary | ICD-10-CM | POA: Insufficient documentation

## 2013-06-18 DIAGNOSIS — E1169 Type 2 diabetes mellitus with other specified complication: Secondary | ICD-10-CM | POA: Insufficient documentation

## 2013-06-18 DIAGNOSIS — F17200 Nicotine dependence, unspecified, uncomplicated: Secondary | ICD-10-CM | POA: Insufficient documentation

## 2013-06-18 DIAGNOSIS — Z794 Long term (current) use of insulin: Secondary | ICD-10-CM | POA: Insufficient documentation

## 2013-06-18 DIAGNOSIS — IMO0002 Reserved for concepts with insufficient information to code with codable children: Secondary | ICD-10-CM | POA: Insufficient documentation

## 2013-06-18 DIAGNOSIS — F319 Bipolar disorder, unspecified: Secondary | ICD-10-CM | POA: Insufficient documentation

## 2013-06-18 DIAGNOSIS — F209 Schizophrenia, unspecified: Secondary | ICD-10-CM | POA: Insufficient documentation

## 2013-06-18 DIAGNOSIS — E162 Hypoglycemia, unspecified: Secondary | ICD-10-CM

## 2013-06-18 DIAGNOSIS — F411 Generalized anxiety disorder: Secondary | ICD-10-CM | POA: Insufficient documentation

## 2013-06-18 DIAGNOSIS — G40909 Epilepsy, unspecified, not intractable, without status epilepticus: Secondary | ICD-10-CM | POA: Insufficient documentation

## 2013-06-18 DIAGNOSIS — I10 Essential (primary) hypertension: Secondary | ICD-10-CM | POA: Insufficient documentation

## 2013-06-18 LAB — CBC WITH DIFFERENTIAL/PLATELET
Basophils Absolute: 0 10*3/uL (ref 0.0–0.1)
Eosinophils Absolute: 0 10*3/uL (ref 0.0–0.7)
Hemoglobin: 15.7 g/dL (ref 13.0–17.0)
Lymphocytes Relative: 49 % — ABNORMAL HIGH (ref 12–46)
Lymphs Abs: 2.4 10*3/uL (ref 0.7–4.0)
MCV: 90.4 fL (ref 78.0–100.0)
Monocytes Relative: 9 % (ref 3–12)
Neutrophils Relative %: 41 % — ABNORMAL LOW (ref 43–77)
Platelets: 174 10*3/uL (ref 150–400)
RBC: 4.91 MIL/uL (ref 4.22–5.81)
WBC: 4.9 10*3/uL (ref 4.0–10.5)

## 2013-06-18 LAB — URINALYSIS, ROUTINE W REFLEX MICROSCOPIC
Glucose, UA: 100 mg/dL — AB
Hgb urine dipstick: NEGATIVE
Protein, ur: NEGATIVE mg/dL
Specific Gravity, Urine: >1.03 — ABNORMAL HIGH (ref 1.005–1.030)
Urobilinogen, UA: 0.2 mg/dL (ref 0.0–1.0)
pH: 6 (ref 5.0–8.0)

## 2013-06-18 LAB — GLUCOSE, CAPILLARY
Glucose-Capillary: 61 mg/dL — ABNORMAL LOW (ref 70–99)
Glucose-Capillary: 219 mg/dL — ABNORMAL HIGH (ref 70–99)
Glucose-Capillary: 73 mg/dL (ref 70–99)

## 2013-06-18 LAB — BASIC METABOLIC PANEL
CO2: 30 mEq/L (ref 19–32)
Chloride: 100 mEq/L (ref 96–112)
GFR calc non Af Amer: >90 mL/min (ref >90)
Glucose, Bld: 203 mg/dL — ABNORMAL HIGH (ref 70–99)
Potassium: 3.9 mEq/L (ref 3.5–5.1)
Sodium: 138 mEq/L (ref 135–145)

## 2013-06-18 MED ORDER — OXYCODONE-ACETAMINOPHEN 5-325 MG PO TABS
1.0000 | ORAL_TABLET | Freq: Once | ORAL | Status: AC
  Administered 2013-06-18: 1 via ORAL
  Filled 2013-06-18: qty 1

## 2013-06-18 MED ORDER — OXYCODONE-ACETAMINOPHEN 5-325 MG PO TABS: 1.0000 | ORAL_TABLET | ORAL | Status: DC | PRN

## 2013-06-18 MED ORDER — SODIUM CHLORIDE 0.9 % IV SOLN
Freq: Once | INTRAVENOUS | Status: AC
  Administered 2013-06-18: 12:00:00 via INTRAVENOUS

## 2013-06-18 MED ORDER — CYCLOBENZAPRINE HCL 10 MG PO TABS: 10.0000 mg | ORAL_TABLET | Freq: Three times a day (TID) | ORAL | Status: DC | PRN

## 2013-06-18 MED ORDER — DEXTROSE 50 % IV SOLN
INTRAVENOUS | Status: AC
  Filled 2013-06-18: qty 50

## 2013-06-18 MED ORDER — DEXTROSE 50 % IV SOLN
0.5000 | Freq: Once | INTRAVENOUS | Status: AC
  Administered 2013-06-18: 25 mL via INTRAVENOUS

## 2013-06-18 NOTE — ED Notes (Signed)
Pt seen and evaluated by EDPa for initial assessment. 

## 2013-06-18 NOTE — ED Notes (Signed)
Pt blood sugar unstable, c/o dizziness and weakness. Pt transferred to room 11. Report given to Harlow Asa, RN

## 2013-06-18 NOTE — ED Notes (Signed)
Pt left leg pain that started yesterday, called PCP care and was instructed to come here, denies any recent injury but admits to an old injury due to [facility]

## 2013-06-18 NOTE — ED Notes (Signed)
cbg 61 in triage

## 2013-06-18 NOTE — ED Notes (Signed)
Pt given orange juice, crackers and peanut butter. 
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
## 2013-06-20 NOTE — ED Provider Notes (Signed)
Medical screening examination/treatment/procedure(s) were performed by non-physician practitioner and as supervising physician I was immediately available for consultation/collaboration.  EKG Interpretation   None        Chelli Yerkes, MD 06/20/13 1318 

## 2013-06-23 ENCOUNTER — Emergency Department (HOSPITAL_COMMUNITY)
Admission: EM | Admit: 2013-06-23 | Discharge: 2013-06-23 | Disposition: A | Discharge status: Home / Self Care | Payer: Medicaid Other | Attending: Emergency Medicine | Admitting: Emergency Medicine

## 2013-06-23 ENCOUNTER — Emergency Department (HOSPITAL_COMMUNITY): Payer: Medicaid Other

## 2013-06-23 ENCOUNTER — Encounter (HOSPITAL_COMMUNITY): Payer: Self-pay | Admitting: Emergency Medicine

## 2013-06-23 DIAGNOSIS — Z794 Long term (current) use of insulin: Secondary | ICD-10-CM | POA: Insufficient documentation

## 2013-06-23 DIAGNOSIS — F411 Generalized anxiety disorder: Secondary | ICD-10-CM | POA: Insufficient documentation

## 2013-06-23 DIAGNOSIS — R5381 Other malaise: Secondary | ICD-10-CM | POA: Insufficient documentation

## 2013-06-23 DIAGNOSIS — I10 Essential (primary) hypertension: Secondary | ICD-10-CM | POA: Insufficient documentation

## 2013-06-23 DIAGNOSIS — Z79899 Other long term (current) drug therapy: Secondary | ICD-10-CM | POA: Insufficient documentation

## 2013-06-23 DIAGNOSIS — M545 Low back pain, unspecified: Secondary | ICD-10-CM | POA: Insufficient documentation

## 2013-06-23 DIAGNOSIS — M25569 Pain in unspecified knee: Secondary | ICD-10-CM | POA: Insufficient documentation

## 2013-06-23 DIAGNOSIS — E119 Type 2 diabetes mellitus without complications: Secondary | ICD-10-CM | POA: Insufficient documentation

## 2013-06-23 DIAGNOSIS — Z9889 Other specified postprocedural states: Secondary | ICD-10-CM | POA: Insufficient documentation

## 2013-06-23 DIAGNOSIS — F3130 Bipolar disorder, current episode depressed, mild or moderate severity, unspecified: Secondary | ICD-10-CM | POA: Insufficient documentation

## 2013-06-23 DIAGNOSIS — M25562 Pain in left knee: Secondary | ICD-10-CM

## 2013-06-23 DIAGNOSIS — F17200 Nicotine dependence, unspecified, uncomplicated: Secondary | ICD-10-CM | POA: Insufficient documentation

## 2013-06-23 DIAGNOSIS — G40909 Epilepsy, unspecified, not intractable, without status epilepticus: Secondary | ICD-10-CM | POA: Insufficient documentation

## 2013-06-23 DIAGNOSIS — G8911 Acute pain due to trauma: Secondary | ICD-10-CM | POA: Insufficient documentation

## 2013-06-23 DIAGNOSIS — F209 Schizophrenia, unspecified: Secondary | ICD-10-CM | POA: Insufficient documentation

## 2013-06-23 MED ORDER — OXYCODONE-ACETAMINOPHEN 5-325 MG PO TABS: 2.0000 | ORAL_TABLET | ORAL | Status: DC | PRN

## 2013-06-23 MED ORDER — OXYCODONE-ACETAMINOPHEN 5-325 MG PO TABS
2.0000 | ORAL_TABLET | Freq: Once | ORAL | Status: AC
  Administered 2013-06-23: 2 via ORAL
  Filled 2013-06-23: qty 2

## 2013-06-23 NOTE — ED Notes (Addendum)
Left knee pain began today while dressing for church. Pt states hx of surgery to same knee. NAD.

## 2013-06-23 NOTE — ED Provider Notes (Signed)
CSN: [id number]     Arrival date & time 06/23/13  0830 History  This chart was scribed for Donnetta Hutching, MD by Bennett Scrape, ED Scribe. This patient was seen in room APA03/APA03 and the patient's care was started at 10:22 AM.   Chief Complaint  Patient presents with  . Knee Pain    The history is provided by the patient. No language interpreter was used.    HPI Comments: Melvin Taylor is a 47 y.o. male who presents to the Emergency Department complaining of sudden onset, persistent left knee pain. Pt reports a prior left knee twisting injury while playing basketball 2 to 3 years ago. He states that he had a subsequent surgery with improvement in the knee but now experiences sharp, shooting lower back pain since then. He states that he developed a sudden onset of sharp pain with associated weakness this morning while getting ready to go to church. He reports that he went to step outside when his knee "just gave out" on him and he admits to having difficulty bearing weight on it secondary to pain since. He states that he believed that the cold temperature outside triggered the pain. He reports that he usually treats his symptoms with a knee brace and OTC medications during flare ups at home. He denies any other symptoms currently. He was seen for the same on 06/18/13 for back pain which he states has since improved with Flexeril.   Past Medical History  Diagnosis Date  . Seizures   . Anxiety   . Depression   . Diabetes mellitus   . HTN (hypertension)   . Sleep apnea   . Bipolar 1 disorder   . Insomnia   . Headache(784.0)   . Schizophrenia    Past Surgical History  Procedure Laterality Date  . Mandible fracture surgery    . Knee surgery    . Tonsillectomy     Family History  Problem Relation Age of Onset  . Arthritis    . Diabetes    . Asthma     History  Substance Use Topics  . Smoking status: Current Every Day Smoker -- 1.00 packs/day for 30 years    Types: Cigarettes   . Smokeless tobacco: Never Used  . Alcohol Use: No    Review of Systems  A complete 10 system review of systems was obtained and all systems are negative except as noted in the HPI and PMH.   Allergies  Aspirin  Home Medications   Current Outpatient Rx  Name  Route  Sig  Dispense  Refill  . albuterol (PROVENTIL HFA;VENTOLIN HFA) 108 (90 BASE) MCG/ACT inhaler   Inhalation   Inhale 2 puffs into the lungs every 6 (six) hours as needed. For shortness of breath         . alprazolam (XANAX) 2 MG tablet   Oral   Take 2 mg by mouth 3 (three) times daily as needed for anxiety.          Marland Kitchen amLODipine (NORVASC) 10 MG tablet   Oral   Take 10 mg by mouth daily.          Marland Kitchen atenolol (TENORMIN) 100 MG tablet   Oral   Take 100 mg by mouth daily.           . carisoprodol (SOMA) 350 MG tablet   Oral   Take 350 mg by mouth 2 (two) times daily.          . cyclobenzaprine (  FLEXERIL) 10 MG tablet   Oral   Take 1 tablet (10 mg total) by mouth 3 (three) times daily as needed.   21 tablet   0   . divalproex (DEPAKOTE) 500 MG EC tablet   Oral   Take 500 mg by mouth 3 (three) times daily.          . hydrochlorothiazide (HYDRODIURIL) 25 MG tablet   Oral   Take 25 mg by mouth daily.         . insulin glargine (LANTUS) 100 UNIT/ML injection   Subcutaneous   Inject 70 Units into the skin at bedtime.          . levETIRAcetam (KEPPRA) 500 MG tablet   Oral   Take 500 mg by mouth 2 (two) times daily.         . metFORMIN (GLUCOPHAGE) 1000 MG tablet   Oral   Take 1,000 mg by mouth 2 (two) times daily.           . risperiDONE (RISPERDAL) 2 MG tablet   Oral   Take 2 mg by mouth 2 (two) times daily.           Triage Vitals: BP 138/97  Pulse 75  Temp(Src) 98.2 F (36.8 C) (Oral)  Resp 20  Ht 5\' 9"  (1.753 m)  Wt 175 lb (79.379 kg)  BMI 25.83 kg/m2  SpO2 100%  Physical Exam  Nursing note and vitals reviewed. Constitutional: He is oriented to person, place, and  time. He appears well-developed and well-nourished. No distress.  HENT:  Head: Normocephalic and atraumatic.  Eyes: EOM are normal.  Neck: Neck supple. No tracheal deviation present.  Cardiovascular: Normal rate.   Pulmonary/Chest: Effort normal. No respiratory distress.  Musculoskeletal: Normal range of motion.  Minimally tender anteriorly to the left knee, minimal edema to the left knee, full ROM  Neurological: He is alert and oriented to person, place, and time.  Skin: Skin is warm and dry.  Psychiatric: He has a normal mood and affect. His behavior is normal.    ED Course  Procedures (including critical care time)  DIAGNOSTIC STUDIES: Oxygen Saturation is 100% on room air, normal by my interpretation.    COORDINATION OF CARE: 10:25 AM-Informed pt of radiology results. Discussed discharge plan which includes percocet and a knee immobilizer with pt and pt agreed to plan. Also advised pt to follow up as needed and pt agreed. Addressed symptoms to return for with pt.   Labs Review Labs Reviewed - No data to display Imaging Review Dg Knee Complete 4 Views Left  06/23/2013   CLINICAL DATA:  Left knee pain.  EXAM: LEFT KNEE - COMPLETE 4+ VIEW  COMPARISON:  March 16, 2011.  FINDINGS: There is no evidence of fracture, dislocation, or joint effusion. There is no evidence of arthropathy or other focal bone abnormality. Soft tissues are unremarkable.  IMPRESSION: Normal left knee.   Electronically Signed   By: Roque Lias M.D.   On: 06/23/2013 09:18    EKG Interpretation   None       MDM  No diagnosis found. X-ray left knee negative.   Pain medication. Knee immobilizer. Follow up with orthopedic surgeon  I personally performed the services described in this documentation, which was scribed in my presence. The recorded information has been reviewed and is accurate.     Donnetta Hutching, MD 06/23/13 1055

## 2013-06-23 NOTE — ED Notes (Signed)
Pt alert & oriented x4, stable gait. Patient given discharge instructions, paperwork & prescription(s). Patient  instructed to stop at the registration desk to finish any additional paperwork. Patient verbalized understanding. Pt left department w/ no further questions. 

## 2013-06-24 ENCOUNTER — Encounter (HOSPITAL_COMMUNITY): Payer: Self-pay | Admitting: Emergency Medicine

## 2013-06-24 ENCOUNTER — Emergency Department (HOSPITAL_COMMUNITY)
Admission: EM | Admit: 2013-06-24 | Discharge: 2013-06-25 | Disposition: A | Discharge status: Home / Self Care | Payer: Medicaid Other | Attending: Emergency Medicine | Admitting: Emergency Medicine

## 2013-06-24 DIAGNOSIS — R51 Headache: Secondary | ICD-10-CM | POA: Insufficient documentation

## 2013-06-24 DIAGNOSIS — Z79899 Other long term (current) drug therapy: Secondary | ICD-10-CM | POA: Insufficient documentation

## 2013-06-24 DIAGNOSIS — I10 Essential (primary) hypertension: Secondary | ICD-10-CM | POA: Insufficient documentation

## 2013-06-24 DIAGNOSIS — F209 Schizophrenia, unspecified: Secondary | ICD-10-CM | POA: Insufficient documentation

## 2013-06-24 DIAGNOSIS — E119 Type 2 diabetes mellitus without complications: Secondary | ICD-10-CM | POA: Insufficient documentation

## 2013-06-24 DIAGNOSIS — G40909 Epilepsy, unspecified, not intractable, without status epilepticus: Secondary | ICD-10-CM | POA: Insufficient documentation

## 2013-06-24 DIAGNOSIS — F17200 Nicotine dependence, unspecified, uncomplicated: Secondary | ICD-10-CM | POA: Insufficient documentation

## 2013-06-24 DIAGNOSIS — Z794 Long term (current) use of insulin: Secondary | ICD-10-CM | POA: Insufficient documentation

## 2013-06-24 DIAGNOSIS — F319 Bipolar disorder, unspecified: Secondary | ICD-10-CM | POA: Insufficient documentation

## 2013-06-24 DIAGNOSIS — F411 Generalized anxiety disorder: Secondary | ICD-10-CM | POA: Insufficient documentation

## 2013-06-24 DIAGNOSIS — R569 Unspecified convulsions: Secondary | ICD-10-CM

## 2013-06-24 LAB — BASIC METABOLIC PANEL
BUN: 11 mg/dL (ref 6–23)
Chloride: 100 mEq/L (ref 96–112)
GFR calc Af Amer: >90 mL/min (ref >90)
GFR calc non Af Amer: 87 mL/min — ABNORMAL LOW (ref >90)
Glucose, Bld: 163 mg/dL — ABNORMAL HIGH (ref 70–99)
Potassium: 3.3 mEq/L — ABNORMAL LOW (ref 3.5–5.1)
Sodium: 136 mEq/L (ref 135–145)

## 2013-06-24 LAB — CBC WITH DIFFERENTIAL/PLATELET
Basophils Relative: 0 % (ref 0–1)
Eosinophils Absolute: 0 10*3/uL (ref 0.0–0.7)
Hemoglobin: 16.4 g/dL (ref 13.0–17.0)
Lymphocytes Relative: 41 % (ref 12–46)
Lymphs Abs: 3.1 10*3/uL (ref 0.7–4.0)
Monocytes Relative: 9 % (ref 3–12)
Neutro Abs: 3.8 10*3/uL (ref 1.7–7.7)
Neutrophils Relative %: 50 % (ref 43–77)
Platelets: 219 10*3/uL (ref 150–400)
RBC: 5.14 MIL/uL (ref 4.22–5.81)
WBC: 7.6 10*3/uL (ref 4.0–10.5)

## 2013-06-24 LAB — VALPROIC ACID LEVEL: Valproic Acid Lvl: <10 ug/mL — ABNORMAL LOW (ref 50.0–100.0)

## 2013-06-24 MED ORDER — VALPROATE SODIUM 500 MG/5ML IV SOLN
500.0000 mg | Freq: Once | INTRAVENOUS | Status: AC
  Administered 2013-06-24: 500 mg via INTRAVENOUS
  Filled 2013-06-24: qty 5

## 2013-06-24 MED ORDER — KETOROLAC TROMETHAMINE 30 MG/ML IJ SOLN
30.0000 mg | Freq: Once | INTRAMUSCULAR | Status: AC
  Administered 2013-06-25: 30 mg via INTRAVENOUS
  Filled 2013-06-24: qty 1

## 2013-06-24 MED ORDER — VALPROATE SODIUM 500 MG/5ML IV SOLN
INTRAVENOUS | Status: AC
  Filled 2013-06-24: qty 5

## 2013-06-24 MED ORDER — SODIUM CHLORIDE 0.9 % IV SOLN
INTRAVENOUS | Status: DC
  Administered 2013-06-24: 22:00:00 via INTRAVENOUS

## 2013-06-24 MED ORDER — SODIUM CHLORIDE 0.9 % IV SOLN
Freq: Once | INTRAVENOUS | Status: AC
  Administered 2013-06-24: 22:00:00 via INTRAVENOUS

## 2013-06-24 NOTE — ED Notes (Signed)
Pt states he had a seizure this am, has been sleeping all day. This evening has a headache 10/10 wants medication for headache. Pt states he may have miss taking his seizure medication, not sure. Pt brought here by manager of his apt.

## 2013-06-24 NOTE — ED Notes (Signed)
Seizure this am.  Then slept all day,  Awakened with a headache.    Last sz was 2 mos ago.

## 2013-06-24 NOTE — ED Notes (Signed)
Patient placed on continuous cardiac monitoring, continuous pulse 0x monitoring 

## 2013-06-24 NOTE — ED Notes (Signed)
Pt sleeping, will awake to name

## 2013-06-24 NOTE — ED Provider Notes (Signed)
CSN: [id number]     Arrival date & time 06/24/13  2014 History   First MD Initiated Contact with Patient 06/24/13 2148    Scribed for Geoffery Lyons, MD, the patient was seen in room APA18/APA18. This chart was scribed by Lewanda Rife, ED scribe. Patient's care was started at 9:50 PM  Chief Complaint  Patient presents with  . Seizures   (Consider location/radiation/quality/duration/timing/severity/associated sxs/prior Treatment) The history is provided by the patient and medical records. No language interpreter was used.   HPI Comments: Melvin Taylor is a 47 y.o. male who presents to the Emergency Department with PMHx of seizures complaining of a seizure this morning and "he was sleeping all day". States he usually gets drowsy after seizures. Reports associated diffuse headache, and tongue biting. Denies any aggravating or alleviating factors. Denies associated recent illness. Reports taking depakote and keppra as prescribed. Denies illicit drug use and alcohol abuse.  Past Medical History  Diagnosis Date  . Seizures   . Anxiety   . Depression   . Diabetes mellitus   . HTN (hypertension)   . Sleep apnea   . Bipolar 1 disorder   . Insomnia   . Headache(784.0)   . Schizophrenia    Past Surgical History  Procedure Laterality Date  . Mandible fracture surgery    . Knee surgery    . Tonsillectomy     Family History  Problem Relation Age of Onset  . Arthritis    . Diabetes    . Asthma     History  Substance Use Topics  . Smoking status: Current Every Day Smoker -- 1.00 packs/day for 30 years    Types: Cigarettes  . Smokeless tobacco: Never Used  . Alcohol Use: No    Review of Systems  Neurological: Positive for seizures.  All other systems reviewed and are negative.   A complete 10 system review of systems was obtained and all systems are negative except as noted in the HPI and PMHx.    Allergies  Aspirin  Home Medications   Current Outpatient Rx  Name   Route  Sig  Dispense  Refill  . albuterol (PROVENTIL HFA;VENTOLIN HFA) 108 (90 BASE) MCG/ACT inhaler   Inhalation   Inhale 2 puffs into the lungs every 6 (six) hours as needed. For shortness of breath         . alprazolam (XANAX) 2 MG tablet   Oral   Take 2 mg by mouth 3 (three) times daily as needed for anxiety.          Marland Kitchen amLODipine (NORVASC) 10 MG tablet   Oral   Take 10 mg by mouth daily.          Marland Kitchen atenolol (TENORMIN) 100 MG tablet   Oral   Take 100 mg by mouth daily.           . carisoprodol (SOMA) 350 MG tablet   Oral   Take 350 mg by mouth 2 (two) times daily.          . cyclobenzaprine (FLEXERIL) 10 MG tablet   Oral   Take 1 tablet (10 mg total) by mouth 3 (three) times daily as needed.   21 tablet   0   . divalproex (DEPAKOTE) 500 MG EC tablet   Oral   Take 500 mg by mouth 3 (three) times daily.          . hydrochlorothiazide (HYDRODIURIL) 25 MG tablet   Oral   Take 25 mg  by mouth daily.         . insulin glargine (LANTUS) 100 UNIT/ML injection   Subcutaneous   Inject 70 Units into the skin at bedtime.          . levETIRAcetam (KEPPRA) 500 MG tablet   Oral   Take 500 mg by mouth 2 (two) times daily.         . metFORMIN (GLUCOPHAGE) 1000 MG tablet   Oral   Take 1,000 mg by mouth 2 (two) times daily.           Marland Kitchen oxyCODONE-acetaminophen (PERCOCET) 5-325 MG per tablet   Oral   Take 2 tablets by mouth every 4 (four) hours as needed.   20 tablet   0   . risperiDONE (RISPERDAL) 2 MG tablet   Oral   Take 2 mg by mouth 2 (two) times daily.           BP 114/78  Pulse 112  Temp(Src) 98.3 F (36.8 C) (Oral)  Resp 20  Ht 5\' 6"  (1.676 m)  Wt 180 lb (81.647 kg)  BMI 29.07 kg/m2  SpO2 100% Physical Exam  Nursing note and vitals reviewed. Constitutional: He is oriented to person, place, and time. He appears well-developed and well-nourished. No distress.  HENT:  Head: Normocephalic and atraumatic.  Eyes: EOM are normal.  Neck:  Neck supple. No tracheal deviation present.  Cardiovascular: Normal rate.   Pulmonary/Chest: Effort normal. No respiratory distress.  Musculoskeletal: Normal range of motion.  Neurological: He is alert and oriented to person, place, and time.  Skin: Skin is warm and dry.  Psychiatric: He has a normal mood and affect. His behavior is normal.    ED Course  Procedures (including critical care time) COORDINATION OF CARE:  Nursing notes reviewed. Vital signs reviewed. Initial pt interview and examination performed.   9:52 PM-Discussed work up plan with pt at bedside, which includes CBC with diff panel, BMP, Valproic acid, UA. Pt agrees with plan.   Treatment plan initiated: Medications  0.9 %  sodium chloride infusion ( Intravenous New Bag/Given 06/24/13 2139)  0.9 %  sodium chloride infusion ( Intravenous New Bag/Given 06/24/13 2202)     Initial diagnostic testing ordered.    Labs Review Labs Reviewed  CBC WITH DIFFERENTIAL  BASIC METABOLIC PANEL  VALPROIC ACID LEVEL  URINALYSIS, ROUTINE W REFLEX MICROSCOPIC      MDM  No diagnosis found. Patient is a 47 year old male past medical history of seizure disorder. Presents today complaining of somnolence after experiencing a seizure this morning. Workup reveals a subtherapeutic Depakote level less than 10, however like lites are otherwise unremarkable. He will be given IV Depakote. Once this is infused I feel as though he is stable for discharge. He does remain somnolent however he is arousable and an appropriate.   I personally performed the services described in this documentation, which was scribed in my presence. The recorded information has been reviewed and is accurate.      Geoffery Lyons, MD 06/24/13 [phone]

## 2013-06-25 NOTE — ED Notes (Signed)
Patient given discharge instruction, verbalized understand. IV removed, band aid applied. Patient ambulatory out of the department.  

## 2013-07-04 ENCOUNTER — Encounter (HOSPITAL_COMMUNITY): Payer: Self-pay | Admitting: Emergency Medicine

## 2013-07-04 ENCOUNTER — Emergency Department (HOSPITAL_COMMUNITY)
Admission: EM | Admit: 2013-07-04 | Discharge: 2013-07-04 | Disposition: A | Discharge status: Home / Self Care | Payer: Medicaid Other | Attending: Emergency Medicine | Admitting: Emergency Medicine

## 2013-07-04 DIAGNOSIS — M25569 Pain in unspecified knee: Secondary | ICD-10-CM | POA: Insufficient documentation

## 2013-07-04 DIAGNOSIS — Z79899 Other long term (current) drug therapy: Secondary | ICD-10-CM | POA: Insufficient documentation

## 2013-07-04 DIAGNOSIS — G8929 Other chronic pain: Secondary | ICD-10-CM | POA: Insufficient documentation

## 2013-07-04 DIAGNOSIS — I10 Essential (primary) hypertension: Secondary | ICD-10-CM | POA: Insufficient documentation

## 2013-07-04 DIAGNOSIS — G40909 Epilepsy, unspecified, not intractable, without status epilepticus: Secondary | ICD-10-CM | POA: Insufficient documentation

## 2013-07-04 DIAGNOSIS — F411 Generalized anxiety disorder: Secondary | ICD-10-CM | POA: Insufficient documentation

## 2013-07-04 DIAGNOSIS — F17200 Nicotine dependence, unspecified, uncomplicated: Secondary | ICD-10-CM | POA: Insufficient documentation

## 2013-07-04 DIAGNOSIS — M25562 Pain in left knee: Secondary | ICD-10-CM

## 2013-07-04 DIAGNOSIS — F209 Schizophrenia, unspecified: Secondary | ICD-10-CM | POA: Insufficient documentation

## 2013-07-04 DIAGNOSIS — F319 Bipolar disorder, unspecified: Secondary | ICD-10-CM | POA: Insufficient documentation

## 2013-07-04 DIAGNOSIS — E119 Type 2 diabetes mellitus without complications: Secondary | ICD-10-CM | POA: Insufficient documentation

## 2013-07-04 DIAGNOSIS — Z794 Long term (current) use of insulin: Secondary | ICD-10-CM | POA: Insufficient documentation

## 2013-07-04 MED ORDER — TRAMADOL HCL 50 MG PO TABS
50.0000 mg | ORAL_TABLET | Freq: Once | ORAL | Status: AC
  Administered 2013-07-04: 50 mg via ORAL
  Filled 2013-07-04: qty 1

## 2013-07-04 MED ORDER — TRAMADOL HCL 50 MG PO TABS: 50.0000 mg | ORAL_TABLET | Freq: Four times a day (QID) | ORAL | Status: DC | PRN

## 2013-07-04 NOTE — ED Notes (Signed)
Pain lt knee for 3 days.  Dr Gerilyn Pilgrim to inject his knee on 12/18.

## 2013-07-04 NOTE — ED Notes (Signed)
Patient given discharge instruction, verbalized understand. Patient ambulatory out of the department.  

## 2013-07-04 NOTE — ED Notes (Signed)
Pt states he is out of pain mediation. " I just need to make it to the 18th"
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
## 2013-07-06 NOTE — ED Provider Notes (Signed)
CSN: [id number]     Arrival date & time 07/04/13  1301 History   First MD Initiated Contact with Patient 07/04/13 1335     Chief Complaint  Patient presents with  . Knee Pain   (Consider location/radiation/quality/duration/timing/severity/associated sxs/prior Treatment) Patient is a 47 y.o. male presenting with knee pain. The history is provided by the patient.  Knee Pain Location:  Knee Time since incident: hx of chronic left knee pain that's been worse for 3 days.   Injury: no   Knee location:  L knee Pain details:    Quality:  Aching and throbbing   Radiates to:  Does not radiate   Severity:  Moderate   Onset quality:  Gradual   Timing:  Constant   Progression:  Worsening Chronicity:  Chronic Dislocation: no   Foreign body present:  No foreign bodies Prior injury to area:  No Relieved by: patient was taking percocet for pain, but has ran out of his medication. Worsened by:  Activity and bearing weight Ineffective treatments:  None tried Associated symptoms: stiffness   Associated symptoms: no back pain, no decreased ROM, no fever, no neck pain, no numbness, no swelling and no tingling     Patient reports hx of chronic pain to left knee.  Has ran out of his pain medication and has an appt with Dr. Gerilyn Pilgrim on 07/18/13 to have "a shot put in this knee". He denies fever, chills, swelling or hip pain  Past Medical History  Diagnosis Date  . Seizures   . Anxiety   . Depression   . Diabetes mellitus   . HTN (hypertension)   . Sleep apnea   . Bipolar 1 disorder   . Insomnia   . Headache(784.0)   . Schizophrenia    Past Surgical History  Procedure Laterality Date  . Mandible fracture surgery    . Knee surgery    . Tonsillectomy     Family History  Problem Relation Age of Onset  . Arthritis    . Diabetes    . Asthma     History  Substance Use Topics  . Smoking status: Current Every Day Smoker -- 1.00 packs/day for 30 years    Types: Cigarettes  . Smokeless  tobacco: Never Used  . Alcohol Use: No    Review of Systems  Constitutional: Negative for fever and chills.  Genitourinary: Negative for dysuria and difficulty urinating.  Musculoskeletal: Positive for arthralgias and stiffness. Negative for back pain, joint swelling and neck pain.  Skin: Negative for color change and wound.  All other systems reviewed and are negative.    Allergies  Aspirin  Home Medications   Current Outpatient Rx  Name  Route  Sig  Dispense  Refill  . albuterol (PROVENTIL HFA;VENTOLIN HFA) 108 (90 BASE) MCG/ACT inhaler   Inhalation   Inhale 2 puffs into the lungs every 6 (six) hours as needed. For shortness of breath         . alprazolam (XANAX) 2 MG tablet   Oral   Take 2 mg by mouth 3 (three) times daily as needed for anxiety.          Marland Kitchen amLODipine (NORVASC) 10 MG tablet   Oral   Take 10 mg by mouth daily.          Marland Kitchen atenolol (TENORMIN) 100 MG tablet   Oral   Take 100 mg by mouth daily.           . carisoprodol (SOMA) 350 MG  tablet   Oral   Take 350 mg by mouth 2 (two) times daily.          . cyclobenzaprine (FLEXERIL) 10 MG tablet   Oral   Take 1 tablet (10 mg total) by mouth 3 (three) times daily as needed.   21 tablet   0   . divalproex (DEPAKOTE) 500 MG EC tablet   Oral   Take 500 mg by mouth 3 (three) times daily.          . hydrochlorothiazide (HYDRODIURIL) 25 MG tablet   Oral   Take 25 mg by mouth daily.         . insulin glargine (LANTUS) 100 UNIT/ML injection   Subcutaneous   Inject 70 Units into the skin at bedtime.          . levETIRAcetam (KEPPRA) 500 MG tablet   Oral   Take 500 mg by mouth 2 (two) times daily.         . metFORMIN (GLUCOPHAGE) 1000 MG tablet   Oral   Take 1,000 mg by mouth 2 (two) times daily.           Marland Kitchen oxyCODONE-acetaminophen (PERCOCET) 5-325 MG per tablet   Oral   Take 2 tablets by mouth every 4 (four) hours as needed.   20 tablet   0   . risperiDONE (RISPERDAL) 2 MG  tablet   Oral   Take 2 mg by mouth 2 (two) times daily.          . traMADol (ULTRAM) 50 MG tablet   Oral   Take 1 tablet (50 mg total) by mouth every 6 (six) hours as needed.   15 tablet   0    BP 112/79  Pulse 67  Temp(Src) 97.7 F (36.5 C) (Oral)  Resp 18  Ht 5\' 6"  (1.676 m)  Wt 180 lb (81.647 kg)  BMI 29.07 kg/m2  SpO2 100% Physical Exam  Nursing note and vitals reviewed. Constitutional: He is oriented to person, place, and time. He appears well-developed and well-nourished. No distress.  Cardiovascular: Normal rate, regular rhythm, normal heart sounds and intact distal pulses.   No murmur heard. Pulmonary/Chest: Effort normal and breath sounds normal. No respiratory distress.  Musculoskeletal: He exhibits tenderness. He exhibits no edema.  ttp of the knee.  No erythema, effusion, or step-off deformity.  DP pulse brisk, distal sensation intact. Calf is soft and NT.  Neurological: He is alert and oriented to person, place, and time. He exhibits normal muscle tone. Coordination normal.  Skin: Skin is warm and dry. No erythema.    ED Course  Procedures (including critical care time) Labs Review Labs Reviewed - No data to display Imaging Review No results found.  EKG Interpretation   None       MDM   1. Knee pain, left    Patient with hx of chronic knee pain, has multiple ED visits for pain.  Recently seen and prescribed percocet.  I have advised pt that percocet is not indicated at this time.  Patient had xr of the knee on 06/23/13 that was neg for bony injury. No concerning sx's for septic joint.  Advised pt that he will need to f/u with Dr. Gerilyn Pilgrim.  He appears stable for d/c    Lorrinda Ramstad L. Trisha Mangle, PA-C 07/06/13 1715

## 2013-07-07 NOTE — ED Provider Notes (Signed)
Medical screening examination/treatment/procedure(s) were performed by non-physician practitioner and as supervising physician I was immediately available for consultation/collaboration.  EKG Interpretation   None        Askia Hazelip F Jermar Colter, MD 07/07/13 2203 

## 2013-09-07 ENCOUNTER — Emergency Department (HOSPITAL_COMMUNITY): Payer: Medicaid Other

## 2013-09-07 ENCOUNTER — Inpatient Hospital Stay (HOSPITAL_COMMUNITY)
Admission: EM | Admit: 2013-09-07 | Discharge: 2013-09-10 | DRG: 639 | Disposition: A | Discharge status: Home / Self Care | Payer: Medicaid Other | Attending: Internal Medicine | Admitting: Internal Medicine

## 2013-09-07 ENCOUNTER — Encounter (HOSPITAL_COMMUNITY): Payer: Self-pay | Admitting: Emergency Medicine

## 2013-09-07 DIAGNOSIS — F209 Schizophrenia, unspecified: Secondary | ICD-10-CM | POA: Diagnosis present

## 2013-09-07 DIAGNOSIS — Z91199 Patient's noncompliance with other medical treatment and regimen due to unspecified reason: Secondary | ICD-10-CM

## 2013-09-07 DIAGNOSIS — F319 Bipolar disorder, unspecified: Secondary | ICD-10-CM | POA: Diagnosis present

## 2013-09-07 DIAGNOSIS — Z794 Long term (current) use of insulin: Secondary | ICD-10-CM

## 2013-09-07 DIAGNOSIS — G40909 Epilepsy, unspecified, not intractable, without status epilepticus: Secondary | ICD-10-CM | POA: Diagnosis present

## 2013-09-07 DIAGNOSIS — F411 Generalized anxiety disorder: Secondary | ICD-10-CM | POA: Diagnosis present

## 2013-09-07 DIAGNOSIS — G4730 Sleep apnea, unspecified: Secondary | ICD-10-CM | POA: Diagnosis present

## 2013-09-07 DIAGNOSIS — F17200 Nicotine dependence, unspecified, uncomplicated: Secondary | ICD-10-CM | POA: Diagnosis present

## 2013-09-07 DIAGNOSIS — Z833 Family history of diabetes mellitus: Secondary | ICD-10-CM

## 2013-09-07 DIAGNOSIS — E1165 Type 2 diabetes mellitus with hyperglycemia: Principal | ICD-10-CM

## 2013-09-07 DIAGNOSIS — Z825 Family history of asthma and other chronic lower respiratory diseases: Secondary | ICD-10-CM

## 2013-09-07 DIAGNOSIS — Z9119 Patient's noncompliance with other medical treatment and regimen: Secondary | ICD-10-CM

## 2013-09-07 DIAGNOSIS — R739 Hyperglycemia, unspecified: Secondary | ICD-10-CM | POA: Diagnosis present

## 2013-09-07 DIAGNOSIS — IMO0001 Reserved for inherently not codable concepts without codable children: Principal | ICD-10-CM | POA: Diagnosis present

## 2013-09-07 DIAGNOSIS — I10 Essential (primary) hypertension: Secondary | ICD-10-CM | POA: Diagnosis present

## 2013-09-07 LAB — GLUCOSE, CAPILLARY
GLUCOSE-CAPILLARY: 414 mg/dL — AB (ref 70–99)
GLUCOSE-CAPILLARY: 234 mg/dL — AB (ref 70–99)
Glucose-Capillary: 106 mg/dL — ABNORMAL HIGH (ref 70–99)
Glucose-Capillary: 159 mg/dL — ABNORMAL HIGH (ref 70–99)
Glucose-Capillary: 194 mg/dL — ABNORMAL HIGH (ref 70–99)
Glucose-Capillary: 239 mg/dL — ABNORMAL HIGH (ref 70–99)
Glucose-Capillary: 250 mg/dL — ABNORMAL HIGH (ref 70–99)
Glucose-Capillary: 300 mg/dL — ABNORMAL HIGH (ref 70–99)
Glucose-Capillary: 81 mg/dL (ref 70–99)
Glucose-Capillary: >600 mg/dL (ref 70–99)

## 2013-09-07 LAB — URINALYSIS, ROUTINE W REFLEX MICROSCOPIC
Bilirubin Urine: NEGATIVE
Glucose, UA: 500 mg/dL — AB
Hgb urine dipstick: NEGATIVE
KETONES UR: NEGATIVE mg/dL
Leukocytes, UA: NEGATIVE
NITRITE: NEGATIVE
Protein, ur: NEGATIVE mg/dL
Specific Gravity, Urine: <1.005 — ABNORMAL LOW (ref 1.005–1.030)
Urobilinogen, UA: 0.2 mg/dL (ref 0.0–1.0)
pH: 6 (ref 5.0–8.0)

## 2013-09-07 LAB — CBC WITH DIFFERENTIAL/PLATELET
BASOS ABS: 0 10*3/uL (ref 0.0–0.1)
BASOS PCT: 0 % (ref 0–1)
Eosinophils Absolute: 0.1 10*3/uL (ref 0.0–0.7)
Eosinophils Relative: 1 % (ref 0–5)
HEMATOCRIT: 42.6 % (ref 39.0–52.0)
Hemoglobin: 15.8 g/dL (ref 13.0–17.0)
Lymphocytes Relative: 44 % (ref 12–46)
Lymphs Abs: 2.1 10*3/uL (ref 0.7–4.0)
MCH: 31.9 pg (ref 26.0–34.0)
MCHC: 37.1 g/dL — ABNORMAL HIGH (ref 30.0–36.0)
MCV: 85.9 fL (ref 78.0–100.0)
Monocytes Absolute: 0.3 10*3/uL (ref 0.1–1.0)
Monocytes Relative: 7 % (ref 3–12)
Neutro Abs: 2.2 10*3/uL (ref 1.7–7.7)
Neutrophils Relative %: 48 % (ref 43–77)
Platelets: 201 10*3/uL (ref 150–400)
RBC: 4.96 MIL/uL (ref 4.22–5.81)
RDW: 12.4 % (ref 11.5–15.5)
WBC: 4.7 10*3/uL (ref 4.0–10.5)

## 2013-09-07 LAB — BLOOD GAS, ARTERIAL
ACID-BASE EXCESS: 1.8 mmol/L (ref 0.0–2.0)
Bicarbonate: 26.1 mEq/L — ABNORMAL HIGH (ref 20.0–24.0)
DRAWN BY: 25788
O2 SAT: 96.5 %
PO2 ART: 81 mmHg (ref 80.0–100.0)
Patient temperature: 37
TCO2: 22.8 mmol/L (ref 0–100)
pCO2 arterial: 42.6 mmHg (ref 35.0–45.0)
pH, Arterial: 7.404 (ref 7.350–7.450)

## 2013-09-07 LAB — BASIC METABOLIC PANEL
BUN: 10 mg/dL (ref 6–23)
CHLORIDE: 91 meq/L — AB (ref 96–112)
CO2: 27 mEq/L (ref 19–32)
Calcium: 9.9 mg/dL (ref 8.4–10.5)
Creatinine, Ser: 0.99 mg/dL (ref 0.50–1.35)
GFR calc Af Amer: >90 mL/min (ref >90)
GFR calc non Af Amer: >90 mL/min (ref >90)
Glucose, Bld: 599 mg/dL (ref 70–99)
Potassium: 4.1 mEq/L (ref 3.7–5.3)
Sodium: 133 mEq/L — ABNORMAL LOW (ref 137–147)

## 2013-09-07 MED ORDER — SERTRALINE HCL 50 MG PO TABS
50.0000 mg | ORAL_TABLET | Freq: Every day | ORAL | Status: DC
  Administered 2013-09-07 – 2013-09-09 (×3): 50 mg via ORAL
  Filled 2013-09-07 (×5): qty 1

## 2013-09-07 MED ORDER — PRAZOSIN HCL 1 MG PO CAPS
2.0000 mg | ORAL_CAPSULE | Freq: Every day | ORAL | Status: DC
  Administered 2013-09-07 – 2013-09-09 (×3): 2 mg via ORAL
  Filled 2013-09-07 (×4): qty 1

## 2013-09-07 MED ORDER — SODIUM CHLORIDE 0.9 % IV SOLN
INTRAVENOUS | Status: DC
  Administered 2013-09-07: 3.5 [IU]/h via INTRAVENOUS
  Filled 2013-09-07 (×2): qty 1

## 2013-09-07 MED ORDER — DEXTROSE-NACL 5-0.45 % IV SOLN
INTRAVENOUS | Status: DC
  Administered 2013-09-07 – 2013-09-09 (×5): via INTRAVENOUS

## 2013-09-07 MED ORDER — HYDROCODONE-ACETAMINOPHEN 5-325 MG PO TABS
1.0000 | ORAL_TABLET | Freq: Four times a day (QID) | ORAL | Status: DC | PRN
  Administered 2013-09-08 – 2013-09-09 (×3): 1 via ORAL
  Filled 2013-09-07 (×3): qty 1

## 2013-09-07 MED ORDER — ALPRAZOLAM 1 MG PO TABS
2.0000 mg | ORAL_TABLET | Freq: Three times a day (TID) | ORAL | Status: DC | PRN
  Administered 2013-09-09 (×2): 2 mg via ORAL
  Filled 2013-09-07 (×2): qty 2

## 2013-09-07 MED ORDER — LEVETIRACETAM 500 MG PO TABS
500.0000 mg | ORAL_TABLET | Freq: Two times a day (BID) | ORAL | Status: DC
  Administered 2013-09-07 – 2013-09-09 (×5): 500 mg via ORAL
  Filled 2013-09-07 (×9): qty 1

## 2013-09-07 MED ORDER — INSULIN REGULAR BOLUS VIA INFUSION
0.0000 [IU] | Freq: Three times a day (TID) | INTRAVENOUS | Status: DC
  Administered 2013-09-08: 10 [IU] via INTRAVENOUS
  Filled 2013-09-07: qty 10

## 2013-09-07 MED ORDER — SODIUM CHLORIDE 0.9 % IV BOLUS (SEPSIS)
2000.0000 mL | Freq: Once | INTRAVENOUS | Status: AC
  Administered 2013-09-07: 2000 mL via INTRAVENOUS

## 2013-09-07 MED ORDER — ENOXAPARIN SODIUM 40 MG/0.4ML ~~LOC~~ SOLN
40.0000 mg | SUBCUTANEOUS | Status: DC
  Administered 2013-09-07 – 2013-09-09 (×3): 40 mg via SUBCUTANEOUS
  Filled 2013-09-07 (×3): qty 0.4

## 2013-09-07 MED ORDER — ATENOLOL 25 MG PO TABS
100.0000 mg | ORAL_TABLET | Freq: Every day | ORAL | Status: DC
Start: 2013-09-07 — End: 2013-09-10
  Administered 2013-09-08 – 2013-09-09 (×2): 100 mg via ORAL
  Filled 2013-09-07 (×2): qty 4

## 2013-09-07 MED ORDER — SODIUM CHLORIDE 0.9 % IV SOLN: INTRAVENOUS | Status: DC

## 2013-09-07 MED ORDER — AMLODIPINE BESYLATE 5 MG PO TABS
10.0000 mg | ORAL_TABLET | Freq: Every day | ORAL | Status: DC
  Administered 2013-09-08 – 2013-09-09 (×2): 10 mg via ORAL
  Filled 2013-09-07 (×2): qty 2

## 2013-09-07 MED ORDER — DIVALPROEX SODIUM 250 MG PO DR TAB
500.0000 mg | DELAYED_RELEASE_TABLET | Freq: Three times a day (TID) | ORAL | Status: DC
  Administered 2013-09-07 – 2013-09-09 (×7): 500 mg via ORAL
  Filled 2013-09-07 (×7): qty 2

## 2013-09-07 MED ORDER — DEXTROSE 50 % IV SOLN: 25.0000 mL | INTRAVENOUS | Status: DC | PRN

## 2013-09-07 MED ORDER — RISPERIDONE 1 MG PO TABS
2.0000 mg | ORAL_TABLET | Freq: Two times a day (BID) | ORAL | Status: DC
  Administered 2013-09-07 – 2013-09-09 (×5): 2 mg via ORAL
  Filled 2013-09-07 (×9): qty 2

## 2013-09-07 MED ORDER — HYDROCHLOROTHIAZIDE 25 MG PO TABS
25.0000 mg | ORAL_TABLET | Freq: Every day | ORAL | Status: DC
  Administered 2013-09-08 – 2013-09-09 (×2): 25 mg via ORAL
  Filled 2013-09-07 (×4): qty 1

## 2013-09-07 MED ORDER — SODIUM CHLORIDE 0.9 % IV SOLN
INTRAVENOUS | Status: DC
  Administered 2013-09-07: 14:00:00 via INTRAVENOUS

## 2013-09-07 MED ORDER — PRAZOSIN HCL 1 MG PO CAPS
ORAL_CAPSULE | ORAL | Status: AC
Start: 2013-09-07 — End: 2013-09-07
  Filled 2013-09-07: qty 2

## 2013-09-07 MED ORDER — CARISOPRODOL 350 MG PO TABS
350.0000 mg | ORAL_TABLET | Freq: Two times a day (BID) | ORAL | Status: DC
  Administered 2013-09-07 – 2013-09-09 (×5): 350 mg via ORAL
  Filled 2013-09-07 (×5): qty 1

## 2013-09-07 MED ORDER — HYDROXYZINE HCL 25 MG PO TABS: 25.0000 mg | ORAL_TABLET | Freq: Two times a day (BID) | ORAL | Status: DC | PRN

## 2013-09-07 NOTE — ED Notes (Signed)
Insulin drip verified with Thornton Papas, RN.

## 2013-09-07 NOTE — ED Provider Notes (Signed)
CSN: [id number]     Arrival date & time 09/07/13  1117 History   First MD Initiated Contact with Patient 09/07/13 1224     Chief Complaint  Patient presents with  . Hyperglycemia    HPI Pt was seen at 1235. Per pt, c/o gradual onset and persistence of constant "high blood sugars" at home for the past 2 days. Pt states he has been taking his insulin as prescribed. Pt only c/o "feeling dizzy." Denies fevers, no N/V/D, no abd pain, no CP/palpitaitons, no SOB/cough, no back pain, no focal motor weakness, no tingling/numbness in extremities, no syncope.    Past Medical History  Diagnosis Date  . Seizures   . Anxiety   . Depression   . Diabetes mellitus   . HTN (hypertension)   . Sleep apnea   . Bipolar 1 disorder   . Insomnia   . Headache(784.0)   . Schizophrenia    Past Surgical History  Procedure Laterality Date  . Mandible fracture surgery    . Knee surgery    . Tonsillectomy     Family History  Problem Relation Age of Onset  . Arthritis    . Diabetes    . Asthma     History  Substance Use Topics  . Smoking status: Current Every Day Smoker -- 1.00 packs/day for 30 years    Types: Cigarettes  . Smokeless tobacco: Never Used  . Alcohol Use: No    Review of Systems ROS: Statement: All systems negative except as marked or noted in the HPI; Constitutional: Negative for fever and chills. +"dizziness." ; ; Eyes: Negative for eye pain, redness and discharge. ; ; ENMT: Negative for ear pain, hoarseness, nasal congestion, sinus pressure and sore throat. ; ; Cardiovascular: Negative for chest pain, palpitations, diaphoresis, dyspnea and peripheral edema. ; ; Respiratory: Negative for cough, wheezing and stridor. ; ; Gastrointestinal: Negative for nausea, vomiting, diarrhea, abdominal pain, blood in stool, hematemesis, jaundice and rectal bleeding. . ; ; Genitourinary: Negative for dysuria, flank pain and hematuria. ; ; Musculoskeletal: Negative for back pain and neck pain. Negative  for swelling and trauma.; ; Skin: Negative for pruritus, rash, abrasions, blisters, bruising and skin lesion.; ; Neuro: Negative for headache, lightheadedness and neck stiffness. Negative for weakness, altered level of consciousness , altered mental status, extremity weakness, paresthesias, involuntary movement, seizure and syncope.       Allergies  Aspirin  Home Medications   Current Outpatient Rx  Name  Route  Sig  Dispense  Refill  . alprazolam (XANAX) 2 MG tablet   Oral   Take 2 mg by mouth 3 (three) times daily as needed for anxiety.          Marland Kitchen amLODipine (NORVASC) 10 MG tablet   Oral   Take 10 mg by mouth daily.          Marland Kitchen atenolol (TENORMIN) 100 MG tablet   Oral   Take 100 mg by mouth daily.           . carisoprodol (SOMA) 350 MG tablet   Oral   Take 350 mg by mouth 2 (two) times daily.          . divalproex (DEPAKOTE) 500 MG EC tablet   Oral   Take 500 mg by mouth 3 (three) times daily.          . hydrochlorothiazide (HYDRODIURIL) 25 MG tablet   Oral   Take 25 mg by mouth daily.         Marland Kitchen  HYDROcodone-acetaminophen (NORCO/VICODIN) 5-325 MG per tablet   Oral   Take 1 tablet by mouth every 6 (six) hours as needed for moderate pain.         . hydrOXYzine (ATARAX/VISTARIL) 25 MG tablet   Oral   Take 25 mg by mouth 2 (two) times daily as needed for anxiety.         . insulin glargine (LANTUS) 100 UNIT/ML injection   Subcutaneous   Inject 70 Units into the skin at bedtime.          . levETIRAcetam (KEPPRA) 500 MG tablet   Oral   Take 500 mg by mouth 2 (two) times daily.         . metFORMIN (GLUCOPHAGE) 1000 MG tablet   Oral   Take 1,000 mg by mouth 2 (two) times daily.           . prazosin (MINIPRESS) 2 MG capsule   Oral   Take 2 mg by mouth at bedtime.         . risperiDONE (RISPERDAL) 2 MG tablet   Oral   Take 2 mg by mouth 2 (two) times daily.          . sertraline (ZOLOFT) 50 MG tablet   Oral   Take 50 mg by mouth  daily.          BP 147/97  Pulse 54  Temp(Src) 98.2 F (36.8 C)  Resp 16  Ht 5\' 6"  (1.676 m)  Wt 170 lb (77.111 kg)  BMI 27.45 kg/m2  SpO2 99% Physical Exam 1240: Physical examination:  Nursing notes reviewed; Vital signs and O2 SAT reviewed;  Constitutional: Well developed, Well nourished, In no acute distress; Head:  Normocephalic, atraumatic; Eyes: EOMI, PERRL, No scleral icterus; ENMT: Mouth and pharynx normal, Mucous membranes dry; Neck: Supple, Full range of motion, No lymphadenopathy; Cardiovascular: Regular rate and rhythm, No murmur, rub, or gallop; Respiratory: Breath sounds clear & equal bilaterally, No rales, rhonchi, wheezes.  Speaking full sentences with ease, Normal respiratory effort/excursion; Chest: Nontender, Movement normal; Abdomen: Soft, Nontender, Nondistended, Normal bowel sounds; Genitourinary: No CVA tenderness; Extremities: Pulses normal, No tenderness, No edema, No calf edema or asymmetry.; Neuro: AA&Ox3, Major CN grossly intact.  Speech clear. No gross focal motor or sensory deficits in extremities. Climbs on and off stretcher easily by himself. Gait steady.; Skin: Color normal, Warm, Dry.   ED Course  Procedures  EKG Interpretation    Date/Time:  Saturday September 07 2013 12:52:24 EST Ventricular Rate:  59 PR Interval:  164 QRS Duration: 88 QT Interval:  424 QTC Calculation: 419 R Axis:   -43 Text Interpretation:  Sinus bradycardia Left axis deviation Inferior infarct (cited on or before 15-May-2013) Abnormal ECG When compared with ECG of 15-May-2013 12:43, Nonspecific T wave abnormality now evident in Anterior leads Confirmed by [facility]  MD, Nunzio Cory [phone]) on 09/07/2013 2:36:16 PM            MDM  MDM Reviewed: previous chart, nursing note and vitals Reviewed previous: labs and ECG Interpretation: labs, ECG and x-ray Total time providing critical care: 30-74 minutes. This excludes time spent performing separately reportable procedures and  services. Consults: admitting MD     CRITICAL CARE Performed by: Alfonzo Feller Total critical care time: 35 Critical care time was exclusive of separately billable procedures and treating other patients. Critical care was necessary to treat or prevent imminent or life-threatening deterioration. Critical care was time spent personally by me on the following activities: development  of treatment plan with patient and/or surrogate as well as nursing, discussions with consultants, evaluation of patient's response to treatment, examination of patient, obtaining history from patient or surrogate, ordering and performing treatments and interventions, ordering and review of laboratory studies, ordering and review of radiographic studies, pulse oximetry and re-evaluation of patient's condition.   Results for orders placed during the hospital encounter of 09/07/13  GLUCOSE, CAPILLARY      Result Value Range   Glucose-Capillary >600 (*) 70 - 99 mg/dL  BASIC METABOLIC PANEL      Result Value Range   Sodium 133 (*) 137 - 147 mEq/L   Potassium 4.1  3.7 - 5.3 mEq/L   Chloride 91 (*) 96 - 112 mEq/L   CO2 27  19 - 32 mEq/L   Glucose, Bld 599 (*) 70 - 99 mg/dL   BUN 10  6 - 23 mg/dL   Creatinine, Ser 0.99  0.50 - 1.35 mg/dL   Calcium 9.9  8.4 - 10.5 mg/dL   GFR calc non Af Amer >90  >90 mL/min   GFR calc Af Amer >90  >90 mL/min  CBC WITH DIFFERENTIAL      Result Value Range   WBC 4.7  4.0 - 10.5 K/uL   RBC 4.96  4.22 - 5.81 MIL/uL   Hemoglobin 15.8  13.0 - 17.0 g/dL   HCT 42.6  39.0 - 52.0 %   MCV 85.9  78.0 - 100.0 fL   MCH 31.9  26.0 - 34.0 pg   MCHC 37.1 (*) 30.0 - 36.0 g/dL   RDW 12.4  11.5 - 15.5 %   Platelets 201  150 - 400 K/uL   Neutrophils Relative % 48  43 - 77 %   Neutro Abs 2.2  1.7 - 7.7 K/uL   Lymphocytes Relative 44  12 - 46 %   Lymphs Abs 2.1  0.7 - 4.0 K/uL   Monocytes Relative 7  3 - 12 %   Monocytes Absolute 0.3  0.1 - 1.0 K/uL   Eosinophils Relative 1  0 - 5 %    Eosinophils Absolute 0.1  0.0 - 0.7 K/uL   Basophils Relative 0  0 - 1 %   Basophils Absolute 0.0  0.0 - 0.1 K/uL  URINALYSIS, ROUTINE W REFLEX MICROSCOPIC      Result Value Range   Color, Urine YELLOW  YELLOW   APPearance CLEAR  CLEAR   Specific Gravity, Urine <1.005 (*) 1.005 - 1.030   pH 6.0  5.0 - 8.0   Glucose, UA 500 (*) NEGATIVE mg/dL   Hgb urine dipstick NEGATIVE  NEGATIVE   Bilirubin Urine NEGATIVE  NEGATIVE   Ketones, ur NEGATIVE  NEGATIVE mg/dL   Protein, ur NEGATIVE  NEGATIVE mg/dL   Urobilinogen, UA 0.2  0.0 - 1.0 mg/dL   Nitrite NEGATIVE  NEGATIVE   Leukocytes, UA NEGATIVE  NEGATIVE  BLOOD GAS, ARTERIAL      Result Value Range   pH, Arterial 7.404  7.350 - 7.450   pCO2 arterial 42.6  35.0 - 45.0 mmHg   pO2, Arterial 81.0  80.0 - 100.0 mmHg   Bicarbonate 26.1 (*) 20.0 - 24.0 mEq/L   TCO2 22.8  0 - 100 mmol/L   Acid-Base Excess 1.8  0.0 - 2.0 mmol/L   O2 Saturation 96.5     Patient temperature 37.0     Collection site RIGHT RADIAL     Drawn by [id number]     Sample type ARTERIAL  Allens test (pass/fail) PASS  PASS  GLUCOSE, CAPILLARY      Result Value Range   Glucose-Capillary 414 (*) 70 - 99 mg/dL  GLUCOSE, CAPILLARY      Result Value Range   Glucose-Capillary 300 (*) 70 - 99 mg/dL   Dg Chest 2 View 09/07/2013   CLINICAL DATA:  Hyperglycemia  EXAM: CHEST  2 VIEW  COMPARISON:  July 28, 2012  FINDINGS: The lungs are clear. Heart size and pulmonary vascularity are normal. No adenopathy. No bone lesions.  IMPRESSION: No abnormality noted.   Electronically Signed   By: Lowella Grip M.D.   On: 09/07/2013 13:38     1350:  CBG elevated on arrival; IVF 2L bolus started. Hyperglycemic on labs with AG 15; will start IV insulin gtt. VS remain stable. Dx and testing d/w pt and family.  Questions answered.  Verb understanding, agreeable to admit. T/C to Dr. Legrand Rams, case discussed, including:  HPI, pertinent PM/SHx, VS/PE, dx testing, ED course and treatment:  Agreeable  to admit, requests to write temporary orders, obtain stepdown bed to his service.   Alfonzo Feller, DO 09/10/13 1109

## 2013-09-07 NOTE — ED Notes (Signed)
Discussed patient's home medications with him. Patient states that he has already taken Norvasc, Atenolol, and HCTZ at home this morning. Patient states that he has already taken [place] once today. Patient states that he normally takes Keppra 1000 mg at bedtime, he believes that this is extended release; Risperdal 4 mg does not know if this is extended release, normally takes this at bedtime; Depakote 1500 mg, states this is extended release. Zoloft 50 mg normally takes this at night.

## 2013-09-07 NOTE — ED Notes (Signed)
Patient with c/o hyperglycemia this AM. Reports high readings at home, took home insulin. CBG reading High in triage. C/o dizziness. Denies pain.

## 2013-09-07 NOTE — Progress Notes (Signed)
ANTICOAGULATION CONSULT NOTE - Initial Consult  Pharmacy Consult for Lovenox Indication: VTE prophylaxis  Allergies  Allergen Reactions  . Aspirin Swelling    Patient Measurements: Height: 5\' 6"  (167.6 cm) Weight: 170 lb (77.111 kg) IBW/kg (Calculated) : 63.8  Vital Signs: Temp: 98.2 F (36.8 C) (02/07 1157) BP: 147/97 mmHg (02/07 1346) Pulse Rate: 54 (02/07 1346)  Labs:  Recent Labs  09/07/13 1220  HGB 15.8  HCT 42.6  PLT 201  CREATININE 0.99    Estimated Creatinine Clearance: 88.2 ml/min (by C-G formula based on Cr of 0.99).   Medical History: Past Medical History  Diagnosis Date  . Seizures   . Anxiety   . Depression   . Diabetes mellitus   . HTN (hypertension)   . Sleep apnea   . Bipolar 1 disorder   . Insomnia   . Headache(784.0)   . Schizophrenia     Medications:  Scheduled:  . amLODipine  10 mg Oral Daily  . atenolol  100 mg Oral Daily  . carisoprodol  350 mg Oral BID  . divalproex  500 mg Oral TID  . hydrochlorothiazide  25 mg Oral Daily  . levETIRAcetam  500 mg Oral BID  . prazosin  2 mg Oral QHS  . risperiDONE  2 mg Oral BID  . sertraline  50 mg Oral Daily    Assessment: Lovenox for VTE px.  CBC reviewed.  No bleeding noted.  Renal function at baseline.  Goal of Therapy:  Monitor platelets by anticoagulation protocol: Yes   Plan:  Lovenox 40mg  sq daily Pharmacy to sign off.  Please re-consult as needed.   Biagio Borg 09/07/2013,3:12 PM

## 2013-09-08 LAB — GLUCOSE, CAPILLARY
GLUCOSE-CAPILLARY: 217 mg/dL — AB (ref 70–99)
GLUCOSE-CAPILLARY: 94 mg/dL (ref 70–99)
GLUCOSE-CAPILLARY: 180 mg/dL — AB (ref 70–99)
Glucose-Capillary: 159 mg/dL — ABNORMAL HIGH (ref 70–99)
Glucose-Capillary: 202 mg/dL — ABNORMAL HIGH (ref 70–99)
Glucose-Capillary: 212 mg/dL — ABNORMAL HIGH (ref 70–99)
Glucose-Capillary: 221 mg/dL — ABNORMAL HIGH (ref 70–99)
Glucose-Capillary: 276 mg/dL — ABNORMAL HIGH (ref 70–99)
Glucose-Capillary: 342 mg/dL — ABNORMAL HIGH (ref 70–99)
Glucose-Capillary: 378 mg/dL — ABNORMAL HIGH (ref 70–99)
Glucose-Capillary: 78 mg/dL (ref 70–99)
Glucose-Capillary: 81 mg/dL (ref 70–99)

## 2013-09-08 LAB — HEMOGLOBIN A1C
Hgb A1c MFr Bld: 12 % — ABNORMAL HIGH (ref <5.7)
Mean Plasma Glucose: 298 mg/dL — ABNORMAL HIGH (ref <117)

## 2013-09-08 LAB — MRSA PCR SCREENING: MRSA BY PCR: NEGATIVE

## 2013-09-08 MED ORDER — INSULIN ASPART 100 UNIT/ML ~~LOC~~ SOLN
0.0000 [IU] | Freq: Three times a day (TID) | SUBCUTANEOUS | Status: DC
  Administered 2013-09-08: 20 [IU] via SUBCUTANEOUS
  Administered 2013-09-08: 4 [IU] via SUBCUTANEOUS
  Administered 2013-09-09: 11 [IU] via SUBCUTANEOUS

## 2013-09-08 MED ORDER — INSULIN GLARGINE 100 UNIT/ML ~~LOC~~ SOLN
70.0000 [IU] | SUBCUTANEOUS | Status: AC
  Administered 2013-09-08: 70 [IU] via SUBCUTANEOUS
  Filled 2013-09-08: qty 0.7

## 2013-09-08 MED ORDER — METFORMIN HCL 500 MG PO TABS
1000.0000 mg | ORAL_TABLET | Freq: Two times a day (BID) | ORAL | Status: DC
  Administered 2013-09-08 – 2013-09-09 (×2): 1000 mg via ORAL
  Filled 2013-09-08 (×2): qty 2

## 2013-09-08 MED ORDER — INSULIN GLARGINE 100 UNIT/ML ~~LOC~~ SOLN
70.0000 [IU] | Freq: Every day | SUBCUTANEOUS | Status: DC
  Filled 2013-09-08: qty 0.7

## 2013-09-08 NOTE — Progress Notes (Signed)
Utilization review completed.  

## 2013-09-08 NOTE — H&P (Signed)
Melvin Taylor MRN: [id number] DOB/AGE: 1966/04/13 48 y.o. Primary Care Physician:Geisha Abernathy, MD Admit date: 09/07/2013 Chief Complaint:  Very high blood sugar HPI:  This is a 48 years old male patient with history of multiple medical illnesses including DM type II came to ER due to very high blood sugar. He is non complaint on his treatment. Claims he was out of his insulin for few days. Then he started taking back his medications medications including insulin but his blood sugar continued to run above 500 mg/dl. However, his blood test showed no sign of acidoses. Patient feels weak and has poluria and nocturia. No fever, chills, cough, nausea, vomiting, abdominal pain, dysuria, urgency or frequency of urination.  Past Medical History  Diagnosis Date  . Seizures   . Anxiety   . Depression   . Diabetes mellitus   . HTN (hypertension)   . Sleep apnea   . Bipolar 1 disorder   . Insomnia   . Headache(784.0)   . Schizophrenia    Past Surgical History  Procedure Laterality Date  . Mandible fracture surgery    . Knee surgery    . Tonsillectomy          Family History  Problem Relation Age of Onset  . Arthritis    . Diabetes    . Asthma      Social History:  reports that he has been smoking Cigarettes.  He has a 30 pack-year smoking history. He has never used smokeless tobacco. He reports that he uses illicit drugs. He reports that he does not drink alcohol.   Allergies:  Allergies  Allergen Reactions  . Aspirin Swelling    Medications Prior to Admission  Medication Sig Dispense Refill  . alprazolam (XANAX) 2 MG tablet Take 2 mg by mouth 3 (three) times daily as needed for anxiety.       Marland Kitchen amLODipine (NORVASC) 10 MG tablet Take 10 mg by mouth daily.       Marland Kitchen atenolol (TENORMIN) 100 MG tablet Take 100 mg by mouth daily.        . carisoprodol (SOMA) 350 MG tablet Take 350 mg by mouth 2 (two) times daily.       . divalproex (DEPAKOTE) 500 MG EC tablet Take 500 mg by mouth  3 (three) times daily.       . hydrochlorothiazide (HYDRODIURIL) 25 MG tablet Take 25 mg by mouth daily.      Marland Kitchen HYDROcodone-acetaminophen (NORCO/VICODIN) 5-325 MG per tablet Take 1 tablet by mouth every 6 (six) hours as needed for moderate pain.      . hydrOXYzine (ATARAX/VISTARIL) 25 MG tablet Take 25 mg by mouth 2 (two) times daily as needed for anxiety.      . insulin glargine (LANTUS) 100 UNIT/ML injection Inject 70 Units into the skin at bedtime.       . levETIRAcetam (KEPPRA) 500 MG tablet Take 500 mg by mouth 2 (two) times daily.      . metFORMIN (GLUCOPHAGE) 1000 MG tablet Take 1,000 mg by mouth 2 (two) times daily.        . prazosin (MINIPRESS) 2 MG capsule Take 2 mg by mouth at bedtime.      . risperiDONE (RISPERDAL) 2 MG tablet Take 2 mg by mouth 2 (two) times daily.       . sertraline (ZOLOFT) 50 MG tablet Take 50 mg by mouth daily.           WUJ:WJXBJ from the symptoms mentioned above,there are  no other symptoms referable to all systems reviewed.  Physical Exam: Blood pressure 104/71, pulse 59, temperature 97.9 F (36.6 C), temperature source Axillary, resp. rate 12, height 5\' 6"  (1.676 m), weight 77.9 kg (171 lb 11.8 oz), SpO2 95.00%. General Condition- chronically sick looking, not in distress HE ENT- pupils equal and reactive, neck supple Respiratory- good air entry, clear lung field CVS-S1 and S2 heard, no murmur or gallop ABD- soft and lax, bowel sound ++, EXT- no leg edema    Recent Labs  09/07/13 1220  WBC 4.7  NEUTROABS 2.2  HGB 15.8  HCT 42.6  MCV 85.9  PLT 201    Recent Labs  09/07/13 1220  NA 133*  K 4.1  CL 91*  CO2 27  GLUCOSE 599*  BUN 10  CREATININE 0.99  CALCIUM 9.9  lablast2(ast:2,ALT:2,alkphos:2,bilitot:2,prot:2,albumin:2)@    Recent Results (from the past 240 hour(s))  MRSA PCR SCREENING     Status: None   Collection Time    09/07/13 11:56 PM      Result Value Range Status   MRSA by PCR NEGATIVE  NEGATIVE Final   Comment:             The GeneXpert MRSA Assay (FDA     approved for NASAL specimens     only), is one component of a     comprehensive MRSA colonization     surveillance program. It is not     intended to diagnose MRSA     infection nor to guide or     monitor treatment for     MRSA infections.     Dg Chest 2 View  09/07/2013   CLINICAL DATA:  Hyperglycemia  EXAM: CHEST  2 VIEW  COMPARISON:  July 28, 2012  FINDINGS: The lungs are clear. Heart size and pulmonary vascularity are normal. No adenopathy. No bone lesions.  IMPRESSION: No abnormality noted.   Electronically Signed   By: Lowella Grip M.D.   On: 09/07/2013 13:38   Impression:  Active Problems:   Hyperglycemia without ketosis DM type II Non complaint Hypertension Seizure disorder Depression disorder schizophrenia   Plan: Medications reviewed Continue insulin drip Endocrine conssult Will do HGA1C.      Melvin Taylor   09/08/2013, 8:18 AM

## 2013-09-08 NOTE — Progress Notes (Signed)
Subjective: Patient was admitted yesterday due blood sugar of 590. No sign os acidosis. He was started on insulin drip. His blood sugar has improved. Patient is known to be noon-complaint on his treatment.  Objective: Vital signs in last 24 hours: Temp:  [97.9 F (36.6 C)-98.6 F (37 C)] 97.9 F (36.6 C) (02/08 0400) Pulse Rate:  [54-72] 59 (02/08 0700) Resp:  [11-18] 12 (02/08 0700) BP: (93-147)/(65-111) 104/71 mmHg (02/08 0700) SpO2:  [94 %-100 %] 95 % (02/08 0700) Weight:  [76.2 kg (167 lb 15.9 oz)-77.9 kg (171 lb 11.8 oz)] 77.9 kg (171 lb 11.8 oz) (02/08 0500) Weight change:  Last BM Date: 09/07/13  Intake/Output from previous day: 02/07 0701 - 02/08 0700 In: 2854.9 [P.O.:720; I.V.:2134.9] Out: 800 [Urine:800]  PHYSICAL EXAM General appearance: alert and no distress Resp: clear to auscultation bilaterally Cardio: regularly irregular rhythm GI: soft, non-tender; bowel sounds normal; no masses,  no organomegaly Extremities: extremities normal, atraumatic, no cyanosis or edema  Lab Results:    @labtest @ [facility]  Recent Labs  09/07/13 1235  PHART 7.404  PO2ART 81.0  TCO2 22.8  HCO3 26.1*   CULTURES Recent Results (from the past 240 hour(s))  MRSA PCR SCREENING     Status: None   Collection Time    09/07/13 11:56 PM      Result Value Range Status   MRSA by PCR NEGATIVE  NEGATIVE Final   Comment:            The GeneXpert MRSA Assay (FDA     approved for NASAL specimens     only), is one component of a     comprehensive MRSA colonization     surveillance program. It is not     intended to diagnose MRSA     infection nor to guide or     monitor treatment for     MRSA infections.   Studies/Results: Dg Chest 2 View  09/07/2013   CLINICAL DATA:  Hyperglycemia  EXAM: CHEST  2 VIEW  COMPARISON:  July 28, 2012  FINDINGS: The lungs are clear. Heart size and pulmonary vascularity are normal. No adenopathy. No bone lesions.  IMPRESSION: No abnormality noted.    Electronically Signed   By: Lowella Grip M.D.   On: 09/07/2013 13:38    Medications: I have reviewed the patient's current medications.  Assesment:  Active Problems:   Hyperglycemia without ketosis DM type II  Non complaint  Hypertension  Seizure disorder  Depression disorder  schizophrenia    Plan: Medications reviewed Will d/c insulin drip and start him on lantus and sliding scale HGA1C Endocrine consult    LOS: 1 day   Keysean Savino 09/08/2013, 8:32 AM

## 2013-09-09 LAB — GLUCOSE, CAPILLARY
GLUCOSE-CAPILLARY: 172 mg/dL — AB (ref 70–99)
Glucose-Capillary: 267 mg/dL — ABNORMAL HIGH (ref 70–99)
Glucose-Capillary: 216 mg/dL — ABNORMAL HIGH (ref 70–99)
Glucose-Capillary: 111 mg/dL — ABNORMAL HIGH (ref 70–99)
Glucose-Capillary: 168 mg/dL — ABNORMAL HIGH (ref 70–99)

## 2013-09-09 LAB — BASIC METABOLIC PANEL
BUN: 10 mg/dL (ref 6–23)
CALCIUM: 7.7 mg/dL — AB (ref 8.4–10.5)
CO2: 25 mEq/L (ref 19–32)
Chloride: 103 mEq/L (ref 96–112)
Creatinine, Ser: 0.78 mg/dL (ref 0.50–1.35)
GFR calc Af Amer: >90 mL/min (ref >90)
Glucose, Bld: 291 mg/dL — ABNORMAL HIGH (ref 70–99)
POTASSIUM: 3.4 meq/L — AB (ref 3.7–5.3)
SODIUM: 138 meq/L (ref 137–147)

## 2013-09-09 LAB — CBC
HCT: 35.4 % — ABNORMAL LOW (ref 39.0–52.0)
Hemoglobin: 12.8 g/dL — ABNORMAL LOW (ref 13.0–17.0)
MCH: 31.4 pg (ref 26.0–34.0)
MCHC: 36.2 g/dL — AB (ref 30.0–36.0)
MCV: 87 fL (ref 78.0–100.0)
Platelets: 180 10*3/uL (ref 150–400)
RBC: 4.07 MIL/uL — ABNORMAL LOW (ref 4.22–5.81)
RDW: 12.5 % (ref 11.5–15.5)
WBC: 6.4 10*3/uL (ref 4.0–10.5)

## 2013-09-09 MED ORDER — POTASSIUM CHLORIDE CRYS ER 20 MEQ PO TBCR
40.0000 meq | EXTENDED_RELEASE_TABLET | Freq: Two times a day (BID) | ORAL | Status: DC
  Administered 2013-09-09 (×2): 40 meq via ORAL
  Filled 2013-09-09 (×2): qty 2

## 2013-09-09 MED ORDER — INSULIN GLARGINE 100 UNIT/ML ~~LOC~~ SOLN
50.0000 [IU] | Freq: Every day | SUBCUTANEOUS | Status: DC
  Filled 2013-09-09: qty 0.5

## 2013-09-09 MED ORDER — NICOTINE 21 MG/24HR TD PT24
21.0000 mg | MEDICATED_PATCH | Freq: Every day | TRANSDERMAL | Status: DC
  Administered 2013-09-09: 21 mg via TRANSDERMAL
  Filled 2013-09-09: qty 1

## 2013-09-09 MED ORDER — INSULIN GLARGINE 100 UNIT/ML ~~LOC~~ SOLN
70.0000 [IU] | Freq: Every day | SUBCUTANEOUS | Status: DC
  Administered 2013-09-09: 70 [IU] via SUBCUTANEOUS
  Filled 2013-09-09 (×2): qty 0.7

## 2013-09-09 MED ORDER — INSULIN ASPART 100 UNIT/ML ~~LOC~~ SOLN
0.0000 [IU] | SUBCUTANEOUS | Status: DC
  Administered 2013-09-09: 3 [IU] via SUBCUTANEOUS
  Administered 2013-09-09: 2 [IU] via SUBCUTANEOUS
  Administered 2013-09-09: 1 [IU] via SUBCUTANEOUS

## 2013-09-09 MED ORDER — INSULIN ASPART 100 UNIT/ML ~~LOC~~ SOLN
5.0000 [IU] | Freq: Three times a day (TID) | SUBCUTANEOUS | Status: DC
  Administered 2013-09-09 (×2): 5 [IU] via SUBCUTANEOUS

## 2013-09-09 NOTE — Progress Notes (Signed)
Subjective: Patient feels better. His blood sugar is running in the range of 250 mg/dl. No new complaint.  Objective: Vital signs in last 24 hours: Temp:  [97.9 F (36.6 C)-98.3 F (36.8 C)] 98 F (36.7 C) (02/09 0400) Pulse Rate:  [25-78] 25 (02/09 0600) Resp:  [13-23] 23 (02/09 0600) BP: (87-123)/(54-88) 111/81 mmHg (02/09 0600) SpO2:  [91 %-97 %] 91 % (02/09 0600) Weight:  [78.8 kg (173 lb 11.6 oz)] 78.8 kg (173 lb 11.6 oz) (02/09 0500) Weight change: 1.689 kg (3 lb 11.6 oz) Last BM Date: 09/09/13  Intake/Output from previous day: 02/08 0701 - 02/09 0700 In: 4015 [P.O.:1140; I.V.:2875] Out: 2600 [Urine:2600]  PHYSICAL EXAM General appearance: alert and no distress Resp: clear to auscultation bilaterally Cardio: regularly irregular rhythm GI: soft, non-tender; bowel sounds normal; no masses,  no organomegaly Extremities: extremities normal, atraumatic, no cyanosis or edema  Lab Results:    @labtest @ [facility]  Recent Labs  09/07/13 1235  PHART 7.404  PO2ART 81.0  TCO2 22.8  HCO3 26.1*   CULTURES Recent Results (from the past 240 hour(s))  MRSA PCR SCREENING     Status: None   Collection Time    09/07/13 11:56 PM      Result Value Range Status   MRSA by PCR NEGATIVE  NEGATIVE Final   Comment:            The GeneXpert MRSA Assay (FDA     approved for NASAL specimens     only), is one component of a     comprehensive MRSA colonization     surveillance program. It is not     intended to diagnose MRSA     infection nor to guide or     monitor treatment for     MRSA infections.   Studies/Results: Dg Chest 2 View  09/07/2013   CLINICAL DATA:  Hyperglycemia  EXAM: CHEST  2 VIEW  COMPARISON:  July 28, 2012  FINDINGS: The lungs are clear. Heart size and pulmonary vascularity are normal. No adenopathy. No bone lesions.  IMPRESSION: No abnormality noted.   Electronically Signed   By: Lowella Grip M.D.   On: 09/07/2013 13:38    Medications: I have reviewed  the patient's current medications.  Assesment:  Active Problems:   Hyperglycemia without ketosis DM type II  Non complaint  Hypertension  Seizure disorder  Depression disorder  schizophrenia    Plan: Medications reviewed Will d/c insulin drip and start him on lantus and sliding scale Will continue to adjust his insulin Endocrine consult    LOS: 2 days   Daaiyah Baumert 09/09/2013, 7:52 AM

## 2013-09-09 NOTE — Progress Notes (Signed)
Nutrition Note   Received MD consult for DM edu. Attempted education, but pt asleep at time of visit. Will reattempt at a later date.   Tatumn Corbridge A. Jimmye Norman, RD, LDN Pager: [phone]

## 2013-09-09 NOTE — Progress Notes (Signed)
Pt being transferred to room 308, report given to Alma Downs, RN. Marry Guan

## 2013-09-09 NOTE — Consult Note (Signed)
Melvin Taylor MRN: [id number] DOB/AGE: [birth year] 48 y.o. Primary Care Physician:FANTA,TESFAYE, MD Admit date: 09/07/2013 Chief Complaint:  Consult for uncontrolled type  2 DM. HPI: This is a 48 years old male patient. I am familiar to him from prior consults. He however has not seen me in over a year. He was supposed to be on insulin therapy, but says that he ran out of his meds.  He has multiple medical illnesses including DM type II . He came came to ER due to very high blood sugar above 500 mg/dl. He is non complaint on his treatment.   At ER he has shown severe hyperglycemia but no DKA. He was admitted to ICU for stabilization. Currently feeling better, eating well , and want to go home.  No fever, chills, cough, nausea, vomiting, abdominal pain, dysuria, urgency or frequency of urination.   Past Medical History  Diagnosis Date  . Seizures   . Anxiety   . Depression   . Diabetes mellitus   . HTN (hypertension)   . Sleep apnea   . Bipolar 1 disorder   . Insomnia   . Headache(784.0)   . Schizophrenia         Family History  Problem Relation Age of Onset  . Arthritis    . Diabetes    . Asthma      Social History:  reports that he has been smoking Cigarettes.  He has a 30 pack-year smoking history. He has never used smokeless tobacco. He reports that he uses illicit drugs. He reports that he does not drink alcohol.   Allergies:  Allergies  Allergen Reactions  . Aspirin Swelling    Medications Prior to Admission  Medication Sig Dispense Refill  . alprazolam (XANAX) 2 MG tablet Take 2 mg by mouth 3 (three) times daily as needed for anxiety.       Marland Kitchen amLODipine (NORVASC) 10 MG tablet Take 10 mg by mouth daily.       Marland Kitchen atenolol (TENORMIN) 100 MG tablet Take 100 mg by mouth daily.        . carisoprodol (SOMA) 350 MG tablet Take 350 mg by mouth 2 (two) times daily.       . divalproex (DEPAKOTE) 500 MG EC tablet Take 500 mg by mouth 3 (three) times daily.       .  hydrochlorothiazide (HYDRODIURIL) 25 MG tablet Take 25 mg by mouth daily.      Marland Kitchen HYDROcodone-acetaminophen (NORCO/VICODIN) 5-325 MG per tablet Take 1 tablet by mouth every 6 (six) hours as needed for moderate pain.      . hydrOXYzine (ATARAX/VISTARIL) 25 MG tablet Take 25 mg by mouth 2 (two) times daily as needed for anxiety.      . insulin glargine (LANTUS) 100 UNIT/ML injection Inject 70 Units into the skin at bedtime.       . levETIRAcetam (KEPPRA) 500 MG tablet Take 500 mg by mouth 2 (two) times daily.      . metFORMIN (GLUCOPHAGE) 1000 MG tablet Take 1,000 mg by mouth 2 (two) times daily.        . prazosin (MINIPRESS) 2 MG capsule Take 2 mg by mouth at bedtime.      . risperiDONE (RISPERDAL) 2 MG tablet Take 2 mg by mouth 2 (two) times daily.       . sertraline (ZOLOFT) 50 MG tablet Take 50 mg by mouth daily.           NGE:XBMWU from the symptoms mentioned above,there  are no other symptoms referable to all systems reviewed.  Physical Exam: Blood pressure 111/81, pulse 25, temperature 98 F (36.7 C), temperature source Oral, resp. rate 23, height 5\' 6"  (1.676 m), weight 78.8 kg (173 lb 11.6 oz), SpO2 91.00%. Gen: No acute distress. HEENT: moist MM Chest; CTAB CVS: RRR, no M/G Abd: soft , non tender Ext: no edema CNS: non focal.    Recent Labs  09/07/13 1220 09/09/13 0454  WBC 4.7 6.4  NEUTROABS 2.2  --   HGB 15.8 12.8*  HCT 42.6 35.4*  MCV 85.9 87.0  PLT 201 180    Recent Labs  09/07/13 1220 09/09/13 0454  NA 133* 138  K 4.1 3.4*  CL 91* 103  CO2 27 25  GLUCOSE 599* 291*  BUN 10 10  CREATININE 0.99 0.78  CALCIUM 9.9 7.7*     A1c 12%    No results found for this basename: AST, ALT, ALKPHOS, BILITOT, PROT, ALBUMIN,  in the last 72 hours Recent Results (from the past 240 hour(s))  MRSA PCR SCREENING     Status: None   Collection Time    09/07/13 11:56 PM      Result Value Range Status   MRSA by PCR NEGATIVE  NEGATIVE Final   Comment:            The  GeneXpert MRSA Assay (FDA     approved for NASAL specimens     only), is one component of a     comprehensive MRSA colonization     surveillance program. It is not     intended to diagnose MRSA     infection nor to guide or     monitor treatment for     MRSA infections.    Dg Chest 2 View  09/07/2013   CLINICAL DATA:  Hyperglycemia  EXAM: CHEST  2 VIEW  COMPARISON:  July 28, 2012  FINDINGS: The lungs are clear. Heart size and pulmonary vascularity are normal. No adenopathy. No bone lesions.  IMPRESSION: No abnormality noted.   Electronically Signed   By: Lowella Grip M.D.   On: 09/07/2013 13:38   Impression: Chronically uncontrolled type 2 DM Medical non compliance HTN Schizophrenia Depression Active Smoking Active Problems:   Hyperglycemia without ketosis   Plan: Mr. Balke remains at a high risk for complications, W5I is high at 12%. Acutely , to help stabilize his glycemia, he will need basal/bolus insulin. I lowered his Basal insulin to 50 units qhs, initiate Novolog 5 units TIDAC plus correction dose. I will hold Metformin while he is in hospital, although he will resume as out patient in the face of normal hepato-renal function. He has other oral medication options as well. I have counseled him on the need to monitor BG AC and HS to dose his insulin properly. I will call DM coordinator to bedside for more teaching. I will see him in house and in  office in 1 week for insulin titration.  Thank you for the consult.     Anjanae Woehrle 09/09/2013, 11:27 AM

## 2013-09-09 NOTE — Progress Notes (Signed)
Pt c/o wanting to smoke, notified Dr. Legrand Rams, order received for 21mg  nicotine patch. Marry Guan

## 2013-09-09 NOTE — Progress Notes (Signed)
Spoke with patient about diabetes and outpatient regimen for diabetes control.  Patient reports that he is followed by Dr. Legrand Rams for diabetes management.  Patient reports that he ran out of insulin and forgot to go pick up his refill.  He reports that he has only missed taking his Lantus for the past couple of days.  Patient reports that he takes Lantus 70  Units QHS (insulin pen) and Metformin 1000 mg BID for diabetes management as an outpatient.  He reports that he checks his blood sugar 2-3 times a day and reports that his blood sugar usually runs between 110-200 mg/dl.  Inquired about knowledge regarding A1C and patient reports that he does not know what an A1C is.  Discussed A1C results (12.0% on 09/07/13) and explained what an A1C is, basic pathophysiology of diabetes, basic home care, importance of checking CBGs and maintaining good CBG control to prevent long-term and short-term complications. Discussed impact of nutrition, exercise, stress, and sickness/infection on blood glucose control.  Patient states that he recently had some dental issues with a tooth infection.  He also states that he has been under a significant amount of stress over the past 3-4 weeks due to PTSD and visual and auditory hallucinations.  Patient reports that he is seeing his psychiatrist on a weekly basis at this time trying to cope with mental illness and acute manifestations.  In discussing nutrition, patient reports that he drinks regular sodas frequently but states that he has someone take him to the grocery store and help him select foods.  Discussed basics of carbohydrate counting.  Will consult inpatient RD for more information on diet.  Informed patient about free diabetes education class at [facility] taught by RD.  Patient reports that he will think about attending one of the outpatient diabetes education classes.  Discussed basal/bolus insulin and stressed importance of taking medications as prescribed and frequently  monitoring blood sugar.  Patient reports that he has Novolog insulin at home and that he use to take Novolog 10 units with meals but he has not used Novolog in a couple of months.  Discussed difference in Lantus and Novolog. Reviewed signs and symptoms of hyperglycemia and hypoglycemia along with treatment for both. Patient asked if he would be prescribed a set dose of Novolog or a correction scale at discharge and he was informed that Dr. Dorris Fetch will provide detailed instructions at time of discharge.  RNs to provide ongoing basic DM education at bedside with this patient and engage patient to actively check blood glucose and administer insulin injections. Have asked patient to watch DM videos and provided a list of videos that he will need to watch.  Instructed patient how to access patient education videos.  Patient verbalized understanding of information discussed and reports that he has no further questions related to diabetes at this time.  Thanks, Barnie Alderman, RN, MSN, CCRN Diabetes Coordinator Inpatient Diabetes Program [phone] (Team Pager) [phone] ([facility] office) [phone] [facility] office)

## 2013-09-10 LAB — GLUCOSE, CAPILLARY
GLUCOSE-CAPILLARY: 83 mg/dL (ref 70–99)
GLUCOSE-CAPILLARY: 77 mg/dL (ref 70–99)
Glucose-Capillary: 120 mg/dL — ABNORMAL HIGH (ref 70–99)

## 2013-09-10 MED ORDER — INSULIN GLARGINE 100 UNIT/ML ~~LOC~~ SOLN: 40.0000 [IU] | Freq: Every day | SUBCUTANEOUS | Status: DC

## 2013-09-10 MED ORDER — INSULIN GLARGINE 100 UNIT/ML ~~LOC~~ SOLN
40.0000 [IU] | Freq: Every day | SUBCUTANEOUS | Status: DC
  Filled 2013-09-10: qty 0.4

## 2013-09-10 NOTE — Care Management Note (Signed)
    Page 1 of 1   09/10/2013     9:12:07 AM   CARE MANAGEMENT NOTE 09/10/2013  Patient:  Melvin Taylor, Melvin Taylor   Account Number:  [id number]  Date Initiated:  09/10/2013  Documentation initiated by:  Theophilus Kinds  Subjective/Objective Assessment:   Pt admitted from home with hyperglycemia. Pt lives alone and will return home at discharge. Pt is independent with ADL's.     Action/Plan:   No CM needs noted.   Anticipated DC Date:  09/10/2013   Anticipated DC Plan:  [place]  CM consult      Choice offered to / List presented to:             Status of service:  Completed, signed off Medicare Important Message given?   (If response is "NO", the following Medicare IM given date fields will be blank) Date Medicare IM given:   Date Additional Medicare IM given:    Discharge Disposition:  HOME/SELF CARE  Per UR Regulation:    If discussed at Long Length of Stay Meetings, dates discussed:    Comments:  09/10/13 [place], RN BSN CM

## 2013-09-10 NOTE — Discharge Summary (Signed)
Physician Discharge Summary  Patient ID: Melvin Taylor MRN: [id number] DOB/AGE: 1965/11/07 48 y.o. Primary Care Physician:Jaquil Todt, MD Admit date: 09/07/2013 Discharge date: 09/10/2013    Discharge Diagnoses:  Chronically uncontrolled type 2 DM  Medical non compliance  HTN  Schizophrenia  Depression  Active Smoking  Active Problems:  Hyperglycemia without ketosis  Active Problems:   Hyperglycemia without ketosis     Medication List         alprazolam 2 MG tablet  Commonly known as:  XANAX  Take 2 mg by mouth 3 (three) times daily as needed for anxiety.     amLODipine 10 MG tablet  Commonly known as:  NORVASC  Take 10 mg by mouth daily.     atenolol 100 MG tablet  Commonly known as:  TENORMIN  Take 100 mg by mouth daily.     carisoprodol 350 MG tablet  Commonly known as:  SOMA  Take 350 mg by mouth 2 (two) times daily.     divalproex 500 MG DR tablet  Commonly known as:  DEPAKOTE  Take 500 mg by mouth 3 (three) times daily.     hydrochlorothiazide 25 MG tablet  Commonly known as:  HYDRODIURIL  Take 25 mg by mouth daily.     HYDROcodone-acetaminophen 5-325 MG per tablet  Commonly known as:  NORCO/VICODIN  Take 1 tablet by mouth every 6 (six) hours as needed for moderate pain.     hydrOXYzine 25 MG tablet  Commonly known as:  ATARAX/VISTARIL  Take 25 mg by mouth 2 (two) times daily as needed for anxiety.     insulin glargine 100 UNIT/ML injection  Commonly known as:  LANTUS  Inject 70 Units into the skin at bedtime.     levETIRAcetam 500 MG tablet  Commonly known as:  KEPPRA  Take 500 mg by mouth 2 (two) times daily.     metFORMIN 1000 MG tablet  Commonly known as:  GLUCOPHAGE  Take 1,000 mg by mouth 2 (two) times daily.     prazosin 2 MG capsule  Commonly known as:  MINIPRESS  Take 2 mg by mouth at bedtime.     risperiDONE 2 MG tablet  Commonly known as:  RISPERDAL  Take 2 mg by mouth 2 (two) times daily.     sertraline 50 MG tablet   Commonly known as:  ZOLOFT  Take 50 mg by mouth daily.        Discharged Condition: stable    Consults: endocrine  Significant Diagnostic Studies: Dg Chest 2 View  09/07/2013   CLINICAL DATA:  Hyperglycemia  EXAM: CHEST  2 VIEW  COMPARISON:  July 28, 2012  FINDINGS: The lungs are clear. Heart size and pulmonary vascularity are normal. No adenopathy. No bone lesions.  IMPRESSION: No abnormality noted.   Electronically Signed   By: Lowella Grip M.D.   On: 09/07/2013 13:38    Lab Results: Basic Metabolic Panel:  Recent Labs  09/07/13 1220 09/09/13 0454  NA 133* 138  K 4.1 3.4*  CL 91* 103  CO2 27 25  GLUCOSE 599* 291*  BUN 10 10  CREATININE 0.99 0.78  CALCIUM 9.9 7.7*   Liver Function Tests: No results found for this basename: AST, ALT, ALKPHOS, BILITOT, PROT, ALBUMIN,  in the last 72 hours   CBC:  Recent Labs  09/07/13 1220 09/09/13 0454  WBC 4.7 6.4  NEUTROABS 2.2  --   HGB 15.8 12.8*  HCT 42.6 35.4*  MCV 85.9 87.0  PLT  201 180    Recent Results (from the past 240 hour(s))  MRSA PCR SCREENING     Status: None   Collection Time    09/07/13 11:56 PM      Result Value Range Status   MRSA by PCR NEGATIVE  NEGATIVE Final   Comment:            The GeneXpert MRSA Assay (FDA     approved for NASAL specimens     only), is one component of a     comprehensive MRSA colonization     surveillance program. It is not     intended to diagnose MRSA     infection nor to guide or     monitor treatment for     MRSA infections.     Hospital Course:   This is a 48 years old male patient with history of multiple medical illnesses who was admitted due sever hyperglycemia without acidosis. He is known to be noncompliant and he didn't take his medications for several days before admission. He was treated with insulin drip. After he improved he was put back on long acting and short acting insulin. He was evaluated by endocrinologist. Patient is strongly advised to  comply on his medications. He is discharged in stable condition.  Discharge Exam: Blood pressure 92/61, pulse 61, temperature 97.5 F (36.4 C), temperature source Oral, resp. rate 20, height 5\' 6"  (1.676 m), weight 78.8 kg (173 lb 11.6 oz), SpO2 94.00%.   Disposition:  home        Follow-up Information   Follow up with NIDA,GEBRESELASSIE, MD On 09/16/2013. (10:30am)    Specialty:  Endocrinology   Contact information:   [place] [address] [phone]       Follow up with [facility], MD In 2 weeks.   Specialty:  Internal Medicine   Contact information:   [place] [address] [phone]       Signed: Rosita Fire   09/10/2013, 8:32 AM

## 2013-09-10 NOTE — Progress Notes (Signed)
AVS reviewed with patient.  Patient verbalized understanding of discharge instructions, physician follow-up and medications.  Teach back used.  Patient's IV removed.  Site WNL.  Patient refused wheelchair.  Ambulated to main entrance accompanied by NT for discharge.  Patient stable at time of discharge.

## 2013-09-10 NOTE — Progress Notes (Signed)
Dr. Dorris Fetch changed patient's order for Lantus.  Patient stated that he needed a prescription for the Lantus.  RN paged Dr. Dorris Fetch and notified him.  Dr. Dorris Fetch stated that he would call prescription in for Lantus at [facility].  Patient verbalizes upcoming appointment with Dr. Dorris Fetch.

## 2014-01-17 ENCOUNTER — Emergency Department (HOSPITAL_COMMUNITY)
Admission: EM | Admit: 2014-01-17 | Discharge: 2014-01-17 | Disposition: A | Discharge status: Home / Self Care | Payer: Medicaid Other | Attending: Emergency Medicine | Admitting: Emergency Medicine

## 2014-01-17 ENCOUNTER — Encounter (HOSPITAL_COMMUNITY): Payer: Self-pay | Admitting: Emergency Medicine

## 2014-01-17 DIAGNOSIS — F17200 Nicotine dependence, unspecified, uncomplicated: Secondary | ICD-10-CM | POA: Insufficient documentation

## 2014-01-17 DIAGNOSIS — F319 Bipolar disorder, unspecified: Secondary | ICD-10-CM | POA: Insufficient documentation

## 2014-01-17 DIAGNOSIS — E119 Type 2 diabetes mellitus without complications: Secondary | ICD-10-CM | POA: Insufficient documentation

## 2014-01-17 DIAGNOSIS — I10 Essential (primary) hypertension: Secondary | ICD-10-CM | POA: Insufficient documentation

## 2014-01-17 DIAGNOSIS — F411 Generalized anxiety disorder: Secondary | ICD-10-CM | POA: Insufficient documentation

## 2014-01-17 DIAGNOSIS — F209 Schizophrenia, unspecified: Secondary | ICD-10-CM | POA: Insufficient documentation

## 2014-01-17 DIAGNOSIS — G40909 Epilepsy, unspecified, not intractable, without status epilepticus: Secondary | ICD-10-CM | POA: Insufficient documentation

## 2014-01-17 DIAGNOSIS — Z792 Long term (current) use of antibiotics: Secondary | ICD-10-CM | POA: Insufficient documentation

## 2014-01-17 DIAGNOSIS — R569 Unspecified convulsions: Secondary | ICD-10-CM

## 2014-01-17 DIAGNOSIS — Z794 Long term (current) use of insulin: Secondary | ICD-10-CM | POA: Insufficient documentation

## 2014-01-17 DIAGNOSIS — Z79899 Other long term (current) drug therapy: Secondary | ICD-10-CM | POA: Insufficient documentation

## 2014-01-17 LAB — URINE MICROSCOPIC-ADD ON

## 2014-01-17 LAB — URINALYSIS, ROUTINE W REFLEX MICROSCOPIC
Bilirubin Urine: NEGATIVE
Glucose, UA: >1000 mg/dL — AB
Ketones, ur: 15 mg/dL — AB
Leukocytes, UA: NEGATIVE
Nitrite: NEGATIVE
Protein, ur: 30 mg/dL — AB
Specific Gravity, Urine: >1.03 — ABNORMAL HIGH (ref 1.005–1.030)
Urobilinogen, UA: 0.2 mg/dL (ref 0.0–1.0)
pH: 5.5 (ref 5.0–8.0)

## 2014-01-17 LAB — CBC WITH DIFFERENTIAL/PLATELET
BASOS ABS: 0 10*3/uL (ref 0.0–0.1)
Basophils Relative: 0 % (ref 0–1)
Eosinophils Absolute: 0.1 10*3/uL (ref 0.0–0.7)
Eosinophils Relative: 1 % (ref 0–5)
HCT: 45.2 % (ref 39.0–52.0)
Hemoglobin: 16.3 g/dL (ref 13.0–17.0)
LYMPHS PCT: 42 % (ref 12–46)
Lymphs Abs: 2.4 10*3/uL (ref 0.7–4.0)
MCH: 31.7 pg (ref 26.0–34.0)
MCHC: 36.1 g/dL — AB (ref 30.0–36.0)
MCV: 87.9 fL (ref 78.0–100.0)
Monocytes Absolute: 0.4 10*3/uL (ref 0.1–1.0)
Monocytes Relative: 7 % (ref 3–12)
NEUTROS ABS: 2.8 10*3/uL (ref 1.7–7.7)
NEUTROS PCT: 50 % (ref 43–77)
PLATELETS: 209 10*3/uL (ref 150–400)
RBC: 5.14 MIL/uL (ref 4.22–5.81)
RDW: 12.6 % (ref 11.5–15.5)
WBC: 5.6 10*3/uL (ref 4.0–10.5)

## 2014-01-17 LAB — RAPID URINE DRUG SCREEN, HOSP PERFORMED
AMPHETAMINES: NOT DETECTED
Barbiturates: NOT DETECTED
Benzodiazepines: NOT DETECTED
Cocaine: POSITIVE — AB
Opiates: NOT DETECTED
TETRAHYDROCANNABINOL: POSITIVE — AB

## 2014-01-17 LAB — ETHANOL: Alcohol, Ethyl (B): <11 mg/dL (ref 0–11)

## 2014-01-17 LAB — BASIC METABOLIC PANEL
BUN: 5 mg/dL — AB (ref 6–23)
CALCIUM: 8.5 mg/dL (ref 8.4–10.5)
CO2: 21 mEq/L (ref 19–32)
CREATININE: 0.91 mg/dL (ref 0.50–1.35)
Chloride: 101 mEq/L (ref 96–112)
Glucose, Bld: 238 mg/dL — ABNORMAL HIGH (ref 70–99)
Potassium: 4.3 mEq/L (ref 3.7–5.3)
Sodium: 140 mEq/L (ref 137–147)

## 2014-01-17 LAB — VALPROIC ACID LEVEL: VALPROIC ACID LVL: 86.6 ug/mL (ref 50.0–100.0)

## 2014-01-17 MED ORDER — SODIUM CHLORIDE 0.9 % IV BOLUS (SEPSIS)
500.0000 mL | Freq: Once | INTRAVENOUS | Status: AC
  Administered 2014-01-17: 500 mL via INTRAVENOUS

## 2014-01-17 MED ORDER — HYDROCODONE-ACETAMINOPHEN 5-325 MG PO TABS: 2.0000 | ORAL_TABLET | ORAL | Status: DC | PRN

## 2014-01-17 NOTE — ED Notes (Signed)
Patient states he wants to be discharged home. MD aware.

## 2014-01-17 NOTE — ED Notes (Signed)
Resting with eyes closed .  No distress

## 2014-01-17 NOTE — Discharge Instructions (Signed)
Driving and Equipment Restrictions Some medical problems make it dangerous to drive, ride a bike, or use machines. Some of these problems are:  A hard blow to the head (concussion).  Passing out (fainting).  Twitching and shaking (seizures).  Low blood sugar.  Taking medicine to help you relax (sedatives).  Taking pain medicines.  Wearing an eye patch.  Wearing splints. This can make it hard to use parts of your body that you need to drive safely. HOME CARE   Do not drive until your doctor says it is okay.  Do not use machines until your doctor says it is okay. You may need a form signed by your doctor (medical release) before you can drive again. You may also need this form before you do other tasks where you need to be fully alert. MAKE SURE YOU:  Understand these instructions.  Will watch your condition.  Will get help right away if you are not doing well or get worse. Document Released: 08/25/2004 Document Revised: 10/10/2011 Document Reviewed: 11/25/2009 [facility] Patient Information 2015 [place], [place]. This information is not intended to replace advice given to you by your health care provider. Make sure you discuss any questions you have with your health care provider.   Blood work was good.   Continue taking her seizure medication. Followup your primary care Dr.

## 2014-01-17 NOTE — ED Notes (Signed)
EMS reports FD found pt in apt in the floor.  EMS reports pt was postictal upon their arrival.  Reports pt woke up with IV stick.  CBG 254.  Pt says is on depakote and has not missed or changed his dose.   Pt awake but drowsy, answers questions appropriately.  Denies pain but c/o generalized weakness.

## 2014-01-17 NOTE — ED Notes (Signed)
Patient remains resting with eyes closed. No distress.

## 2014-01-17 NOTE — ED Provider Notes (Signed)
CSN: [id number]     Arrival date & time 01/17/14  4782 History  This chart was scribed for Nat Christen, MD by Ludger Nutting, ED Scribe. This patient was seen in room APA18/APA18 and the patient's care was started 8:45 AM.    Chief Complaint  Patient presents with  . Seizures      The history is provided by the patient. The history is limited by the condition of the patient. No language interpreter was used.   LEVEL 5 CAVEAT-Postictal  HPI Comments: Melvin Taylor is a 48 y.o. male with past medical history of seizures brought in by ambulance, who presents to the Emergency Department complaining of a possible seizure that occurred PTA. Patient states he does not recall any details of the episode. Per EMS reports, patient was found postictal in his home between a coffee table and couch. He takes Depakote regularly. He complains of a HA.   Past Medical History  Diagnosis Date  . Seizures   . Anxiety   . Depression   . Diabetes mellitus   . HTN (hypertension)   . Sleep apnea   . Bipolar 1 disorder   . Insomnia   . Headache(784.0)   . Schizophrenia    Past Surgical History  Procedure Laterality Date  . Mandible fracture surgery    . Knee surgery    . Tonsillectomy     Family History  Problem Relation Age of Onset  . Arthritis    . Diabetes    . Asthma     History  Substance Use Topics  . Smoking status: Current Every Day Smoker -- 1.00 packs/day for 30 years    Types: Cigarettes  . Smokeless tobacco: Never Used  . Alcohol Use: No    Review of Systems  Unable to perform ROS: Acuity of condition   Allergies  Aspirin  Home Medications   Prior to Admission medications   Medication Sig Start Date End Date Taking? Authorizing Provider  alprazolam Duanne Moron) 2 MG tablet Take 2 mg by mouth 3 (three) times daily as needed for anxiety.    Yes Historical Provider, MD  amLODipine (NORVASC) 10 MG tablet Take 10 mg by mouth daily.    Yes Historical Provider, MD  atenolol  (TENORMIN) 100 MG tablet Take 100 mg by mouth daily.     Yes Historical Provider, MD  carisoprodol (SOMA) 350 MG tablet Take 350 mg by mouth 2 (two) times daily.    Yes Historical Provider, MD  divalproex (DEPAKOTE) 500 MG EC tablet Take 500 mg by mouth 3 (three) times daily.    Yes Historical Provider, MD  hydrochlorothiazide (HYDRODIURIL) 25 MG tablet Take 25 mg by mouth daily.   Yes Historical Provider, MD  HYDROcodone-acetaminophen (NORCO/VICODIN) 5-325 MG per tablet Take 1 tablet by mouth every 6 (six) hours as needed for moderate pain.   Yes Historical Provider, MD  hydrOXYzine (ATARAX/VISTARIL) 25 MG tablet Take 25 mg by mouth 2 (two) times daily as needed for anxiety.   Yes Historical Provider, MD  insulin glargine (LANTUS) 100 UNIT/ML injection Inject 0.4 mLs (40 Units total) into the skin daily. 09/10/13  Yes Rosita Fire, MD  levETIRAcetam (KEPPRA) 500 MG tablet Take 500 mg by mouth 2 (two) times daily.   Yes Historical Provider, MD  metFORMIN (GLUCOPHAGE) 1000 MG tablet Take 1,000 mg by mouth 2 (two) times daily.     Yes Historical Provider, MD  penicillin v potassium (VEETID) 500 MG tablet Take 500 mg by mouth  4 (four) times daily.   Yes Historical Provider, MD  prazosin (MINIPRESS) 2 MG capsule Take 2 mg by mouth at bedtime.   Yes Historical Provider, MD  risperiDONE (RISPERDAL) 2 MG tablet Take 2 mg by mouth 2 (two) times daily.    Yes Dewain Penning, MD  sertraline (ZOLOFT) 50 MG tablet Take 50 mg by mouth daily.   Yes Historical Provider, MD  HYDROcodone-acetaminophen (NORCO) 5-325 MG per tablet Take 2 tablets by mouth every 4 (four) hours as needed. 01/17/14   Nat Christen, MD   BP 125/76  Pulse 58  Temp(Src) 98.1 F (36.7 C) (Oral)  Resp 19  Ht 5\' 7"  (1.702 m)  Wt 180 lb (81.647 kg)  BMI 28.19 kg/m2  SpO2 100% Physical Exam  Nursing note and vitals reviewed. Constitutional: He appears well-developed and well-nourished.  Confused  HENT:  Head: Normocephalic and atraumatic.   Eyes: Conjunctivae and EOM are normal. Pupils are equal, round, and reactive to light.  Neck: Normal range of motion. Neck supple.  Cardiovascular: Normal rate, regular rhythm and normal heart sounds.   Pulmonary/Chest: Effort normal and breath sounds normal.  Abdominal: Soft. Bowel sounds are normal.  Musculoskeletal: Normal range of motion.  Neurological: He is alert. He is disoriented.  Alert and oriented x2  Skin: Skin is warm and dry.  Psychiatric: He has a normal mood and affect. His behavior is normal.    ED Course  Procedures   DIAGNOSTIC STUDIES: Oxygen Saturation is 97% on RA, adequate by my interpretation.    COORDINATION OF CARE: 8:45 AM Discussed treatment plan with pt at bedside and pt agreed to plan.   Labs Review Labs Reviewed  BASIC METABOLIC PANEL - Abnormal; Notable for the following:    Glucose, Bld 238 (*)    BUN 5 (*)    All other components within normal limits  CBC WITH DIFFERENTIAL - Abnormal; Notable for the following:    MCHC 36.1 (*)    All other components within normal limits  URINALYSIS, ROUTINE W REFLEX MICROSCOPIC - Abnormal; Notable for the following:    Specific Gravity, Urine >1.030 (*)    Glucose, UA >1000 (*)    Hgb urine dipstick TRACE (*)    Ketones, ur 15 (*)    Protein, ur 30 (*)    All other components within normal limits  URINE RAPID DRUG SCREEN (HOSP PERFORMED) - Abnormal; Notable for the following:    Cocaine POSITIVE (*)    Tetrahydrocannabinol POSITIVE (*)    All other components within normal limits  URINE MICROSCOPIC-ADD ON - Abnormal; Notable for the following:    Squamous Epithelial / LPF MANY (*)    Bacteria, UA FEW (*)    Casts HYALINE CASTS (*)    All other components within normal limits  VALPROIC ACID LEVEL  ETHANOL    Imaging Review No results found.   EKG Interpretation None      MDM   Final diagnoses:  Seizure    Status post seizure at home. Patient observed for several hours. Depakote level  therapeutic. Drug screen reveals cocaine and marijuana.  At time of discharge, patient was alert and oriented x3 without neurological deficits.  I personally performed the services described in this documentation, which was scribed in my presence. The recorded information has been reviewed and is accurate.   Nat Christen, MD 01/18/14 [phone]

## 2014-01-27 ENCOUNTER — Emergency Department (HOSPITAL_COMMUNITY)
Admission: EM | Admit: 2014-01-27 | Discharge: 2014-01-27 | Disposition: A | Discharge status: Home / Self Care | Payer: Medicaid Other | Attending: Emergency Medicine | Admitting: Emergency Medicine

## 2014-01-27 ENCOUNTER — Encounter (HOSPITAL_COMMUNITY): Payer: Self-pay | Admitting: Emergency Medicine

## 2014-01-27 ENCOUNTER — Emergency Department (HOSPITAL_COMMUNITY): Payer: Medicaid Other

## 2014-01-27 DIAGNOSIS — F209 Schizophrenia, unspecified: Secondary | ICD-10-CM | POA: Insufficient documentation

## 2014-01-27 DIAGNOSIS — F17200 Nicotine dependence, unspecified, uncomplicated: Secondary | ICD-10-CM | POA: Insufficient documentation

## 2014-01-27 DIAGNOSIS — I10 Essential (primary) hypertension: Secondary | ICD-10-CM | POA: Insufficient documentation

## 2014-01-27 DIAGNOSIS — Z792 Long term (current) use of antibiotics: Secondary | ICD-10-CM | POA: Insufficient documentation

## 2014-01-27 DIAGNOSIS — Z79899 Other long term (current) drug therapy: Secondary | ICD-10-CM | POA: Insufficient documentation

## 2014-01-27 DIAGNOSIS — G8911 Acute pain due to trauma: Secondary | ICD-10-CM | POA: Insufficient documentation

## 2014-01-27 DIAGNOSIS — F319 Bipolar disorder, unspecified: Secondary | ICD-10-CM | POA: Insufficient documentation

## 2014-01-27 DIAGNOSIS — E119 Type 2 diabetes mellitus without complications: Secondary | ICD-10-CM | POA: Insufficient documentation

## 2014-01-27 DIAGNOSIS — R51 Headache: Secondary | ICD-10-CM | POA: Insufficient documentation

## 2014-01-27 DIAGNOSIS — R519 Headache, unspecified: Secondary | ICD-10-CM

## 2014-01-27 DIAGNOSIS — G40909 Epilepsy, unspecified, not intractable, without status epilepticus: Secondary | ICD-10-CM | POA: Insufficient documentation

## 2014-01-27 DIAGNOSIS — F411 Generalized anxiety disorder: Secondary | ICD-10-CM | POA: Insufficient documentation

## 2014-01-27 DIAGNOSIS — Z794 Long term (current) use of insulin: Secondary | ICD-10-CM | POA: Insufficient documentation

## 2014-01-27 MED ORDER — METHYLPREDNISOLONE SODIUM SUCC 125 MG IJ SOLR
125.0000 mg | Freq: Once | INTRAMUSCULAR | Status: AC
  Administered 2014-01-27: 125 mg via INTRAVENOUS
  Filled 2014-01-27: qty 2

## 2014-01-27 MED ORDER — KETAMINE HCL 10 MG/ML IJ SOLN
15.0000 mg | Freq: Once | INTRAMUSCULAR | Status: AC
  Administered 2014-01-27: 15 mg via INTRAVENOUS
  Filled 2014-01-27: qty 1

## 2014-01-27 MED ORDER — DIPHENHYDRAMINE HCL 50 MG/ML IJ SOLN
50.0000 mg | Freq: Once | INTRAMUSCULAR | Status: AC
  Administered 2014-01-27: 50 mg via INTRAVENOUS
  Filled 2014-01-27: qty 1

## 2014-01-27 NOTE — Discharge Instructions (Signed)

## 2014-01-27 NOTE — ED Provider Notes (Signed)
CSN: [id number]     Arrival date & time 01/27/14  1246 History  This chart was scribed for Shaune Pollack, MD by Elby Beck, ED Scribe. This patient was seen in room APA09/APA09 and the patient's care was started at 3:38 PM.   Chief Complaint  Patient presents with  . Headache    Patient is a 48 y.o. male presenting with headaches. The history is provided by the patient. No language interpreter was used.  Headache Pain location:  Occipital Quality:  Unable to specify Radiates to:  Does not radiate Severity currently:  Unable to specify Severity at highest:  Unable to specify Onset quality:  Gradual Duration:  3 days Timing:  Constant Progression:  Unchanged Chronicity:  Recurrent Similar to prior headaches: yes   Relieved by:  None tried Worsened by:  Nothing tried Ineffective treatments:  None tried Associated symptoms: no weakness    HPI Comments: Melvin Taylor is a 47 y.o. male with a history of headaches, seizures, HTN and bipolar 1 disorder who presents to the Emergency Department complaining of a constant left-sided occipital headache onset 2 days ago. He states that he was recently discharged after an admission due to seizures. He states that since he was discharged, he has been having as persistent headache. He suspects that he may have fallen and hit his head at the time of the seizure. He denies any associated weakness. He states that he has taken Norco with some relief of his headache.  PCP- Dr. Merlene Laughter   Past Medical History  Diagnosis Date  . Seizures   . Anxiety   . Depression   . Diabetes mellitus   . HTN (hypertension)   . Sleep apnea   . Bipolar 1 disorder   . Insomnia   . Headache(784.0)   . Schizophrenia    Past Surgical History  Procedure Laterality Date  . Mandible fracture surgery    . Knee surgery    . Tonsillectomy     Family History  Problem Relation Age of Onset  . Arthritis    . Diabetes    . Asthma     History  Substance Use  Topics  . Smoking status: Current Every Day Smoker -- 1.00 packs/day for 30 years    Types: Cigarettes  . Smokeless tobacco: Never Used  . Alcohol Use: No    Review of Systems  Neurological: Positive for headaches.  All other systems reviewed and are negative.   Allergies  Aspirin  Home Medications   Prior to Admission medications   Medication Sig Start Date End Date Taking? Authorizing Nghia Mcentee  alprazolam Duanne Moron) 2 MG tablet Take 2 mg by mouth 3 (three) times daily as needed for anxiety.    Yes Historical Tansy Lorek, MD  amLODipine (NORVASC) 10 MG tablet Take 10 mg by mouth daily.    Yes Historical Joanny Dupree, MD  atenolol (TENORMIN) 100 MG tablet Take 100 mg by mouth daily.     Yes Historical Sharmane Dame, MD  carisoprodol (SOMA) 350 MG tablet Take 350 mg by mouth 2 (two) times daily.    Yes Historical Marcina Kinnison, MD  divalproex (DEPAKOTE) 500 MG EC tablet Take 1,500 mg by mouth at bedtime.    Yes Historical Lucifer Soja, MD  hydrochlorothiazide (HYDRODIURIL) 25 MG tablet Take 25 mg by mouth daily.   Yes Historical Genoa Freyre, MD  hydrOXYzine (ATARAX/VISTARIL) 25 MG tablet Take 25 mg by mouth 2 (two) times daily as needed for anxiety.   Yes Historical Meghan Tiemann, MD  insulin  glargine (LANTUS) 100 UNIT/ML injection Inject 0.4 mLs (40 Units total) into the skin daily. 09/10/13  Yes Rosita Fire, MD  levETIRAcetam (KEPPRA) 500 MG tablet Take 1,000 mg by mouth at bedtime.    Yes Historical Algenis Ballin, MD  metFORMIN (GLUCOPHAGE) 1000 MG tablet Take 1,000 mg by mouth 2 (two) times daily.     Yes Historical Kailena Lubas, MD  penicillin v potassium (VEETID) 500 MG tablet Take 500 mg by mouth 4 (four) times daily.   Yes Historical Camela Wich, MD  prazosin (MINIPRESS) 2 MG capsule Take 2 mg by mouth at bedtime.   Yes Historical Maitlyn Penza, MD  risperiDONE (RISPERDAL) 2 MG tablet Take 2 mg by mouth 2 (two) times daily.    Yes Dewain Penning, MD  sertraline (ZOLOFT) 50 MG tablet Take 50 mg by mouth daily.   Yes  Historical Adreonna Yontz, MD  HYDROcodone-acetaminophen (NORCO) 5-325 MG per tablet Take 2 tablets by mouth every 4 (four) hours as needed. 01/17/14   Nat Christen, MD   Triage Vitals: BP 143/93  Pulse 69  Temp(Src) 98 F (36.7 C) (Oral)  Ht 5\' 8"  (1.727 m)  Wt 176 lb (79.833 kg)  BMI 26.77 kg/m2  SpO2 100%  Physical Exam  Nursing note and vitals reviewed. Constitutional: He is oriented to person, place, and time. He appears well-developed and well-nourished. No distress.  HENT:  Head: Normocephalic and atraumatic.  Eyes: Conjunctivae and EOM are normal.  Neck: Neck supple. No tracheal deviation present.  Cardiovascular: Normal rate.   Pulmonary/Chest: Effort normal. No respiratory distress.  Musculoskeletal: Normal range of motion.  Neurological: He is alert and oriented to person, place, and time.  Skin: Skin is warm and dry.  Psychiatric: He has a normal mood and affect. His behavior is normal.    ED Course  Procedures (including critical care time)  DIAGNOSTIC STUDIES: Oxygen Saturation is 100% on RA, normal by my interpretation.    COORDINATION OF CARE: 3:42 PM- Pt advised of plan for treatment and pt agrees.  Labs Review Labs Reviewed - No data to display  Imaging Review No results found.   EKG Interpretation None      MDM   Final diagnoses:  Nonintractable episodic headache, unspecified headache type    48 year old male who comes in today complaining of headache after seizure last week. He states he hit his head during the seizure and the headache had not resolved. He has not had any prior imaging done. A CT here reveals no signs of intracranial hemorrhage. Recent received IV medicines including Solu-Medrol, Benadryl, and low-dose ketamine. He states that his pain feels improved.   Pt HA treated and improved while in ED.  Presentation is like pts typical HA and non concerning for [facility], ICH, Meningitis, or temporal arteritis. Pt is afebrile with no focal neuro  deficits, nuchal rigidity, or change in vision. Pt is to follow up with PCP to discuss prophylactic medication. Pt verbalizes understanding and is agreeable with plan to dc.    I personally performed the services described in this documentation, which was scribed in my presence. The recorded information has been reviewed and considered.   Shaune Pollack, MD 01/28/14 [phone]

## 2014-01-27 NOTE — ED Notes (Signed)
Headache for 2-3 days,  Seen here recently s/p seizure.  Alert, No HI alert, talking.

## 2014-03-12 ENCOUNTER — Encounter (HOSPITAL_COMMUNITY): Payer: Self-pay | Admitting: Emergency Medicine

## 2014-03-12 ENCOUNTER — Emergency Department (HOSPITAL_COMMUNITY): Payer: Medicaid Other

## 2014-03-12 ENCOUNTER — Emergency Department (HOSPITAL_COMMUNITY)
Admission: EM | Admit: 2014-03-12 | Discharge: 2014-03-12 | Disposition: A | Discharge status: Home / Self Care | Payer: Medicaid Other | Attending: Emergency Medicine | Admitting: Emergency Medicine

## 2014-03-12 DIAGNOSIS — Z791 Long term (current) use of non-steroidal anti-inflammatories (NSAID): Secondary | ICD-10-CM | POA: Diagnosis not present

## 2014-03-12 DIAGNOSIS — M79609 Pain in unspecified limb: Secondary | ICD-10-CM | POA: Insufficient documentation

## 2014-03-12 DIAGNOSIS — G40909 Epilepsy, unspecified, not intractable, without status epilepticus: Secondary | ICD-10-CM | POA: Insufficient documentation

## 2014-03-12 DIAGNOSIS — G542 Cervical root disorders, not elsewhere classified: Secondary | ICD-10-CM

## 2014-03-12 DIAGNOSIS — F411 Generalized anxiety disorder: Secondary | ICD-10-CM | POA: Insufficient documentation

## 2014-03-12 DIAGNOSIS — F17200 Nicotine dependence, unspecified, uncomplicated: Secondary | ICD-10-CM | POA: Diagnosis not present

## 2014-03-12 DIAGNOSIS — R739 Hyperglycemia, unspecified: Secondary | ICD-10-CM

## 2014-03-12 DIAGNOSIS — E119 Type 2 diabetes mellitus without complications: Secondary | ICD-10-CM | POA: Diagnosis not present

## 2014-03-12 DIAGNOSIS — Z794 Long term (current) use of insulin: Secondary | ICD-10-CM | POA: Insufficient documentation

## 2014-03-12 DIAGNOSIS — M5412 Radiculopathy, cervical region: Secondary | ICD-10-CM | POA: Insufficient documentation

## 2014-03-12 DIAGNOSIS — I10 Essential (primary) hypertension: Secondary | ICD-10-CM | POA: Insufficient documentation

## 2014-03-12 DIAGNOSIS — Z79899 Other long term (current) drug therapy: Secondary | ICD-10-CM | POA: Diagnosis not present

## 2014-03-12 DIAGNOSIS — F319 Bipolar disorder, unspecified: Secondary | ICD-10-CM | POA: Diagnosis not present

## 2014-03-12 LAB — BASIC METABOLIC PANEL
Anion gap: 13 (ref 5–15)
BUN: 9 mg/dL (ref 6–23)
CHLORIDE: 97 meq/L (ref 96–112)
CO2: 28 mEq/L (ref 19–32)
CREATININE: 0.86 mg/dL (ref 0.50–1.35)
Calcium: 9.6 mg/dL (ref 8.4–10.5)
GFR calc non Af Amer: >90 mL/min (ref >90)
Glucose, Bld: 290 mg/dL — ABNORMAL HIGH (ref 70–99)
Potassium: 4.2 mEq/L (ref 3.7–5.3)
SODIUM: 138 meq/L (ref 137–147)

## 2014-03-12 LAB — CBC WITH DIFFERENTIAL/PLATELET
BASOS PCT: 0 % (ref 0–1)
Basophils Absolute: 0 10*3/uL (ref 0.0–0.1)
Eosinophils Absolute: 0 10*3/uL (ref 0.0–0.7)
Eosinophils Relative: 0 % (ref 0–5)
HCT: 49.1 % (ref 39.0–52.0)
Hemoglobin: 18 g/dL — ABNORMAL HIGH (ref 13.0–17.0)
Lymphocytes Relative: 48 % — ABNORMAL HIGH (ref 12–46)
Lymphs Abs: 3.2 10*3/uL (ref 0.7–4.0)
MCH: 32.5 pg (ref 26.0–34.0)
MCHC: 36.7 g/dL — ABNORMAL HIGH (ref 30.0–36.0)
MCV: 88.6 fL (ref 78.0–100.0)
Monocytes Absolute: 0.4 10*3/uL (ref 0.1–1.0)
Monocytes Relative: 6 % (ref 3–12)
NEUTROS ABS: 3 10*3/uL (ref 1.7–7.7)
NEUTROS PCT: 45 % (ref 43–77)
Platelets: 162 10*3/uL (ref 150–400)
RBC: 5.54 MIL/uL (ref 4.22–5.81)
RDW: 12.6 % (ref 11.5–15.5)
WBC: 6.7 10*3/uL (ref 4.0–10.5)

## 2014-03-12 MED ORDER — NAPROXEN 500 MG PO TABS: 500.0000 mg | ORAL_TABLET | Freq: Two times a day (BID) | ORAL | Status: DC

## 2014-03-12 MED ORDER — HYDROCODONE-ACETAMINOPHEN 5-325 MG PO TABS: 1.0000 | ORAL_TABLET | ORAL | Status: DC | PRN

## 2014-03-12 MED ORDER — PREDNISONE 10 MG PO TABS
60.0000 mg | ORAL_TABLET | Freq: Once | ORAL | Status: AC
  Administered 2014-03-12: 60 mg via ORAL
  Filled 2014-03-12 (×2): qty 1

## 2014-03-12 NOTE — Discharge Instructions (Signed)
Cervical Radiculopathy Cervical radiculopathy means a nerve in the neck is pinched or bruised. This can cause pain or loss of feeling (numbness) that runs from your neck to your arm and fingers. HOME CARE   Put ice on the injured or painful area.  Put ice in a plastic bag.  Place a towel between your skin and the bag.  Leave the ice on for 15-20 minutes, 03-04 times a day, or as told by your doctor.  If ice does not help, you can try using heat. Take a warm shower or bath, or use a hot water bottle as told by your doctor.  You may try a gentle neck and shoulder massage.  Use a flat pillow when you sleep.  Only take medicines as told by your doctor.  Keep all physical therapy visits as told by your doctor.  If you are given a soft collar, wear it as told by your doctor. GET HELP RIGHT AWAY IF:   Your pain gets worse and is not controlled with medicine.  You lose feeling or feel weak in your hand, arm, face, or leg.  You have a fever or stiff neck.  You cannot control when you poop or pee (incontinence).  You have trouble with walking, balance, or speaking. MAKE SURE YOU:   Understand these instructions.  Will watch your condition.  Will get help right away if you are not doing well or get worse. Document Released: 07/07/2011 Document Revised: 10/10/2011 Document Reviewed: 07/07/2011 [facility] Patient Information 2015 [place], [place]. This information is not intended to replace advice given to you by your health care provider. Make sure you discuss any questions you have with your health care provider.  High Blood Sugar High blood sugar (hyperglycemia) means that the level of sugar in your blood is higher than it should be. Signs of high blood sugar include:  Feeling thirsty.  Frequent peeing (urinating).  Feeling tired or sleepy.  Dry mouth.  Vision changes.  Feeling weak.  Feeling hungry but losing weight.  Numbness and tingling in your hands or  feet.  Headache. When you ignore these signs, your blood sugar may keep going up. These problems may get worse, and other problems may begin. HOME CARE  Check your blood sugars as told by your doctor. Write down the numbers with the date and time.  Take the right amount of insulin or diabetes pills at the right time. Write down the dose with date and time.  Refill your insulin or diabetes pills before running out.  Watch what you eat. Follow your meal plan.  Drink liquids without sugar, such as water. Check with your doctor if you have kidney or heart disease.  Follow your doctor's orders for exercise. Exercise at the same time of day.  Keep your doctor's appointments. GET HELP RIGHT AWAY IF:   You have trouble thinking or are confused.  You have fast breathing with fruity smelling breath.  You pass out (faint).  You have 2 to 3 days of high blood sugars and you do not know why.  You have chest pain.  You are feeling sick to your stomach (nauseous) or throwing up (vomiting).  You have sudden vision changes. MAKE SURE YOU:   Understand these instructions.  Will watch your condition.  Will get help right away if you are not doing well or get worse. Document Released: 05/15/2009 Document Revised: 10/10/2011 Document Reviewed: 05/15/2009 [facility] Patient Information 2015 [place], [place]. This information is not intended to replace  advice given to you by your health care provider. Make sure you discuss any questions you have with your health care provider.
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
## 2014-03-12 NOTE — ED Notes (Signed)
Pt c/o sudden left hand/wrist area pain and numbness today. rom wnl. Denies dizziness, n/v/d/trouble swallowing. States does feel weaker in left hand. Neuro check during triage negative. Alert/oriented. Radial pulses strong. States this is same feeling as he had in his leg when he had back pain.

## 2014-03-13 NOTE — ED Provider Notes (Signed)
.  [facility] Medical screening examination/treatment/procedure(s) were performed by non-physician practitioner and as supervising physician I was immediately available for consultation/collaboration.   EKG Interpretation None        Maudry Diego, MD 03/13/14 1930

## 2014-03-14 ENCOUNTER — Encounter (HOSPITAL_COMMUNITY): Payer: Self-pay | Admitting: Emergency Medicine

## 2014-03-14 ENCOUNTER — Emergency Department (HOSPITAL_COMMUNITY)
Admission: EM | Admit: 2014-03-14 | Discharge: 2014-03-14 | Disposition: A | Discharge status: Home / Self Care | Payer: Medicaid Other | Attending: Emergency Medicine | Admitting: Emergency Medicine

## 2014-03-14 DIAGNOSIS — M79609 Pain in unspecified limb: Secondary | ICD-10-CM | POA: Diagnosis not present

## 2014-03-14 DIAGNOSIS — G40909 Epilepsy, unspecified, not intractable, without status epilepticus: Secondary | ICD-10-CM | POA: Diagnosis not present

## 2014-03-14 DIAGNOSIS — M79642 Pain in left hand: Secondary | ICD-10-CM

## 2014-03-14 DIAGNOSIS — F411 Generalized anxiety disorder: Secondary | ICD-10-CM | POA: Insufficient documentation

## 2014-03-14 DIAGNOSIS — Z791 Long term (current) use of non-steroidal anti-inflammatories (NSAID): Secondary | ICD-10-CM | POA: Insufficient documentation

## 2014-03-14 DIAGNOSIS — R209 Unspecified disturbances of skin sensation: Secondary | ICD-10-CM | POA: Diagnosis present

## 2014-03-14 DIAGNOSIS — I10 Essential (primary) hypertension: Secondary | ICD-10-CM | POA: Diagnosis not present

## 2014-03-14 DIAGNOSIS — E119 Type 2 diabetes mellitus without complications: Secondary | ICD-10-CM | POA: Diagnosis not present

## 2014-03-14 DIAGNOSIS — F319 Bipolar disorder, unspecified: Secondary | ICD-10-CM | POA: Insufficient documentation

## 2014-03-14 DIAGNOSIS — F17200 Nicotine dependence, unspecified, uncomplicated: Secondary | ICD-10-CM | POA: Diagnosis not present

## 2014-03-14 DIAGNOSIS — Z79899 Other long term (current) drug therapy: Secondary | ICD-10-CM | POA: Diagnosis not present

## 2014-03-14 DIAGNOSIS — Z794 Long term (current) use of insulin: Secondary | ICD-10-CM | POA: Insufficient documentation

## 2014-03-14 MED ORDER — ACETAMINOPHEN-CODEINE #3 300-30 MG PO TABS: 1.0000 | ORAL_TABLET | Freq: Four times a day (QID) | ORAL | Status: DC | PRN

## 2014-03-14 NOTE — Discharge Instructions (Signed)
Please use your wrist splint until you're seen by the neurology specialist. Please continue your naproxen. Please add the Tylenol codeine for pain. Tylenol codeine may cause drowsiness, please use with caution. Please take this medication as well as the naproxen with food.

## 2014-03-14 NOTE — ED Provider Notes (Signed)
Medical screening examination/treatment/procedure(s) were performed by non-physician practitioner and as supervising physician I was immediately available for consultation/collaboration.   EKG Interpretation None       Nat Christen, MD 03/14/14 [phone]

## 2014-03-14 NOTE — ED Provider Notes (Signed)
CSN: [id number]     Arrival date & time 03/14/14  1113 History   First MD Initiated Contact with Patient 03/14/14 1140     Chief Complaint  Patient presents with  . Numbness     (Consider location/radiation/quality/duration/timing/severity/associated sxs/prior Treatment) HPI Comments: Patient presents to the emergency department with complaint of left hand pain and numbness. The patient was seen in the emergency department approximately a week ago with similar symptoms. He had a negative CT head scan. He had A. essentially negative CT scan of the neck. There was some arthritis changes present. The patient was referred to neurology for nerve conduction studies and additional evaluation. The patient states that the pain seems to be worse today, and he has not had medication. The patient states that he spoke with his primary physician, but they were closing at the time but he called, and he was told to come to the emergency department to get assistance with his pain.  The history is provided by the patient.    Past Medical History  Diagnosis Date  . Seizures   . Anxiety   . Depression   . Diabetes mellitus   . HTN (hypertension)   . Sleep apnea   . Bipolar 1 disorder   . Insomnia   . Headache(784.0)   . Schizophrenia    Past Surgical History  Procedure Laterality Date  . Mandible fracture surgery    . Knee surgery    . Tonsillectomy     Family History  Problem Relation Age of Onset  . Arthritis    . Diabetes    . Asthma     History  Substance Use Topics  . Smoking status: Current Every Day Smoker -- 1.00 packs/day for 30 years    Types: Cigarettes  . Smokeless tobacco: Never Used  . Alcohol Use: No    Review of Systems  Constitutional: Negative for activity change.       All ROS Neg except as noted in HPI  HENT: Negative for nosebleeds.   Eyes: Negative for photophobia and discharge.  Respiratory: Negative for cough, shortness of breath and wheezing.    Cardiovascular: Negative for chest pain and palpitations.  Gastrointestinal: Negative for abdominal pain and blood in stool.  Genitourinary: Negative for dysuria, frequency and hematuria.  Musculoskeletal: Positive for arthralgias. Negative for back pain and neck pain.  Skin: Negative.   Neurological: Positive for numbness. Negative for dizziness, seizures and speech difficulty.  Psychiatric/Behavioral: Negative for hallucinations and confusion.      Allergies  Aspirin  Home Medications   Prior to Admission medications   Medication Sig Start Date End Date Taking? Authorizing Provider  alprazolam Duanne Moron) 2 MG tablet Take 2 mg by mouth 3 (three) times daily as needed for anxiety.    Yes Historical Provider, MD  amLODipine (NORVASC) 10 MG tablet Take 10 mg by mouth daily.    Yes Historical Provider, MD  atenolol (TENORMIN) 100 MG tablet Take 100 mg by mouth daily.     Yes Historical Provider, MD  carisoprodol (SOMA) 350 MG tablet Take 350 mg by mouth 2 (two) times daily.    Yes Historical Provider, MD  divalproex (DEPAKOTE) 500 MG EC tablet Take 1,500 mg by mouth at bedtime.    Yes Historical Provider, MD  hydrochlorothiazide (HYDRODIURIL) 25 MG tablet Take 25 mg by mouth daily.   Yes Historical Provider, MD  hydrOXYzine (ATARAX/VISTARIL) 25 MG tablet Take 25 mg by mouth 2 (two) times daily as needed for  anxiety.   Yes Historical Provider, MD  insulin glargine (LANTUS) 100 UNIT/ML injection Inject 40 Units into the skin at bedtime. 09/10/13  Yes Rosita Fire, MD  levETIRAcetam (KEPPRA) 500 MG tablet Take 1,000 mg by mouth at bedtime.    Yes Historical Provider, MD  metFORMIN (GLUCOPHAGE) 1000 MG tablet Take 1,000 mg by mouth 2 (two) times daily.     Yes Historical Provider, MD  naproxen (NAPROSYN) 500 MG tablet Take 1 tablet (500 mg total) by mouth 2 (two) times daily. 03/12/14  Yes Almyra Free Idol, PA-C   BP 146/98  Pulse 66  Temp(Src) 98 F (36.7 C) (Oral)  Resp 18  SpO2 100% Physical  Exam  Nursing note and vitals reviewed. Constitutional: He is oriented to person, place, and time. He appears well-developed and well-nourished.  Non-toxic appearance.  HENT:  Head: Normocephalic.  Right Ear: Tympanic membrane and external ear normal.  Left Ear: Tympanic membrane and external ear normal.  Eyes: EOM and lids are normal. Pupils are equal, round, and reactive to light.  Neck: Normal range of motion. Neck supple. Carotid bruit is not present.  Cardiovascular: Normal rate, regular rhythm, normal heart sounds, intact distal pulses and normal pulses.   Pulmonary/Chest: Breath sounds normal. No respiratory distress.  Abdominal: Soft. Bowel sounds are normal. There is no tenderness. There is no guarding.  Musculoskeletal: Normal range of motion.  There is full range of motion of the neck. There is mild crepitus present, but no palpable step off of the cervical spine.  This will range of motion of the left shoulder elbow and wrist. Grip is symmetrical. There is no atrophy noted of the left thenar eminence the web spaces.    Lymphadenopathy:       Head (right side): No submandibular adenopathy present.       Head (left side): No submandibular adenopathy present.    He has no cervical adenopathy.  Neurological: He is alert and oriented to person, place, and time. He has normal strength. No cranial nerve deficit or sensory deficit.  No motor or sensory deficits appreciated of the right or left upper extremities.  Skin: Skin is warm and dry.  Psychiatric: He has a normal mood and affect. His speech is normal.    ED Course  Procedures (including critical care time) Labs Review Labs Reviewed - No data to display  Imaging Review No results found.   EKG Interpretation None      MDM The patient has numbness and tingling involving the left hand, particularly the third and fourth fingers of the left hand. Patient is in the midst of an evaluation by neurology for additional  evaluation of this problem. Patient presents to the emergency department today because he states he's having more tingling and pain. Patient is currently taking Naprosyn. Will add  Tylenol codeine. Patient advised additional evaluation and pain management should be done by his primary physician.    Final diagnoses:  None    **I have reviewed nursing notes, vital signs, and all appropriate lab and imaging results for this patient.Lenox Ahr, PA-C 03/14/14 1240

## 2014-03-14 NOTE — ED Notes (Signed)
Pt here the other day for left hand numbness and pain. Was dx with pinched nerve in neck. Pt states unable to get early appt with pcp so came back to ED for pain meds.

## 2014-04-12 ENCOUNTER — Emergency Department (HOSPITAL_COMMUNITY)
Admission: EM | Admit: 2014-04-12 | Discharge: 2014-04-12 | Disposition: A | Discharge status: Home / Self Care | Payer: Medicaid Other | Attending: Emergency Medicine | Admitting: Emergency Medicine

## 2014-04-12 ENCOUNTER — Encounter (HOSPITAL_COMMUNITY): Payer: Self-pay | Admitting: Emergency Medicine

## 2014-04-12 DIAGNOSIS — G40909 Epilepsy, unspecified, not intractable, without status epilepticus: Secondary | ICD-10-CM | POA: Insufficient documentation

## 2014-04-12 DIAGNOSIS — Z794 Long term (current) use of insulin: Secondary | ICD-10-CM | POA: Insufficient documentation

## 2014-04-12 DIAGNOSIS — R569 Unspecified convulsions: Secondary | ICD-10-CM

## 2014-04-12 DIAGNOSIS — Z791 Long term (current) use of non-steroidal anti-inflammatories (NSAID): Secondary | ICD-10-CM | POA: Insufficient documentation

## 2014-04-12 DIAGNOSIS — E119 Type 2 diabetes mellitus without complications: Secondary | ICD-10-CM | POA: Insufficient documentation

## 2014-04-12 DIAGNOSIS — J209 Acute bronchitis, unspecified: Secondary | ICD-10-CM | POA: Diagnosis not present

## 2014-04-12 DIAGNOSIS — R51 Headache: Secondary | ICD-10-CM | POA: Insufficient documentation

## 2014-04-12 DIAGNOSIS — F411 Generalized anxiety disorder: Secondary | ICD-10-CM | POA: Insufficient documentation

## 2014-04-12 DIAGNOSIS — Z79899 Other long term (current) drug therapy: Secondary | ICD-10-CM | POA: Diagnosis not present

## 2014-04-12 DIAGNOSIS — F319 Bipolar disorder, unspecified: Secondary | ICD-10-CM | POA: Insufficient documentation

## 2014-04-12 DIAGNOSIS — J40 Bronchitis, not specified as acute or chronic: Secondary | ICD-10-CM

## 2014-04-12 DIAGNOSIS — F17200 Nicotine dependence, unspecified, uncomplicated: Secondary | ICD-10-CM | POA: Diagnosis not present

## 2014-04-12 DIAGNOSIS — I10 Essential (primary) hypertension: Secondary | ICD-10-CM | POA: Insufficient documentation

## 2014-04-12 LAB — BASIC METABOLIC PANEL WITH GFR
Anion gap: 17 — ABNORMAL HIGH (ref 5–15)
BUN: 15 mg/dL (ref 6–23)
CO2: 23 meq/L (ref 19–32)
Calcium: 9 mg/dL (ref 8.4–10.5)
Chloride: 95 meq/L — ABNORMAL LOW (ref 96–112)
Creatinine, Ser: 0.83 mg/dL (ref 0.50–1.35)
GFR calc Af Amer: >90 mL/min
GFR calc non Af Amer: >90 mL/min
Glucose, Bld: 293 mg/dL — ABNORMAL HIGH (ref 70–99)
Potassium: 4.3 meq/L (ref 3.7–5.3)
Sodium: 135 meq/L — ABNORMAL LOW (ref 137–147)

## 2014-04-12 LAB — URINALYSIS, ROUTINE W REFLEX MICROSCOPIC
Bilirubin Urine: NEGATIVE
Glucose, UA: >1000 mg/dL — AB
Hgb urine dipstick: NEGATIVE
LEUKOCYTES UA: NEGATIVE
NITRITE: NEGATIVE
Protein, ur: NEGATIVE mg/dL
Specific Gravity, Urine: 1.01 (ref 1.005–1.030)
Urobilinogen, UA: 0.2 mg/dL (ref 0.0–1.0)
pH: 6 (ref 5.0–8.0)

## 2014-04-12 LAB — CBC WITH DIFFERENTIAL/PLATELET
Basophils Absolute: 0 10*3/uL (ref 0.0–0.1)
Basophils Relative: 1 % (ref 0–1)
Eosinophils Absolute: 0.1 10*3/uL (ref 0.0–0.7)
Eosinophils Relative: 2 % (ref 0–5)
HCT: 47.3 % (ref 39.0–52.0)
Hemoglobin: 17.2 g/dL — ABNORMAL HIGH (ref 13.0–17.0)
Lymphocytes Relative: 57 % — ABNORMAL HIGH (ref 12–46)
Lymphs Abs: 2.7 10*3/uL (ref 0.7–4.0)
MCH: 32.1 pg (ref 26.0–34.0)
MCHC: 36.4 g/dL — ABNORMAL HIGH (ref 30.0–36.0)
MCV: 88.4 fL (ref 78.0–100.0)
Monocytes Absolute: 0.4 10*3/uL (ref 0.1–1.0)
Monocytes Relative: 9 % (ref 3–12)
Neutro Abs: 1.4 10*3/uL — ABNORMAL LOW (ref 1.7–7.7)
Neutrophils Relative %: 31 % — ABNORMAL LOW (ref 43–77)
Platelets: 143 10*3/uL — ABNORMAL LOW (ref 150–400)
RBC: 5.35 MIL/uL (ref 4.22–5.81)
RDW: 12.4 % (ref 11.5–15.5)
WBC: 4.6 10*3/uL (ref 4.0–10.5)

## 2014-04-12 LAB — RAPID URINE DRUG SCREEN, HOSP PERFORMED
Amphetamines: NOT DETECTED
Barbiturates: NOT DETECTED
Benzodiazepines: NOT DETECTED
Cocaine: NOT DETECTED
Opiates: NOT DETECTED
Tetrahydrocannabinol: POSITIVE — AB

## 2014-04-12 LAB — URINE MICROSCOPIC-ADD ON

## 2014-04-12 LAB — CBG MONITORING, ED: Glucose-Capillary: 350 mg/dL — ABNORMAL HIGH (ref 70–99)

## 2014-04-12 LAB — VALPROIC ACID LEVEL: Valproic Acid Lvl: 49.8 ug/mL — ABNORMAL LOW (ref 50.0–100.0)

## 2014-04-12 MED ORDER — LORAZEPAM 1 MG PO TABS
1.0000 mg | ORAL_TABLET | Freq: Once | ORAL | Status: AC
  Administered 2014-04-12: 1 mg via ORAL
  Filled 2014-04-12: qty 1

## 2014-04-12 MED ORDER — ONDANSETRON HCL 4 MG/2ML IJ SOLN
4.0000 mg | Freq: Once | INTRAMUSCULAR | Status: AC
  Administered 2014-04-12: 4 mg via INTRAVENOUS
  Filled 2014-04-12: qty 2

## 2014-04-12 MED ORDER — AMOXICILLIN 500 MG PO CAPS: 500.0000 mg | ORAL_CAPSULE | Freq: Three times a day (TID) | ORAL | Status: DC

## 2014-04-12 MED ORDER — HYDROMORPHONE HCL PF 1 MG/ML IJ SOLN
1.0000 mg | Freq: Once | INTRAMUSCULAR | Status: AC
  Administered 2014-04-12: 1 mg via INTRAVENOUS
  Filled 2014-04-12: qty 1

## 2014-04-12 NOTE — ED Provider Notes (Signed)
CSN: [id number]     Arrival date & time 04/12/14  1147 History   First MD Initiated Contact with Patient 04/12/14 1325    This chart was scribed for Orpah Greek, * by Evelene Croon, ED Scribe. This patient was seen in room [facility]07[facility] and the patient's care was started 1:28 PM.  Chief Complaint  Patient presents with  . Seizures     The history is provided by the patient.   HPI Comments:  Melvin Taylor is a 48 y.o. male with a h/o seizures presents to the Emergency Department s/p unwitnessed seizure this am complaining of constant HA following the incident. Pt states he feels like he was "hit in the head with a sledgehammer" and notes this is his typical post-seizure HA. Pt states he woke up on the floor  this morning around 0430 and knew he had a seizure. Pt is currently on Depakote and keppra for his seizures and states he has been compliant with his meds. Pt notes recent cough and nasal congestion. No alleviating factors noted.   Past Medical History  Diagnosis Date  . Seizures   . Anxiety   . Depression   . Diabetes mellitus   . HTN (hypertension)   . Sleep apnea   . Bipolar 1 disorder   . Insomnia   . Headache(784.0)   . Schizophrenia    Past Surgical History  Procedure Laterality Date  . Mandible fracture surgery    . Knee surgery    . Tonsillectomy     Family History  Problem Relation Age of Onset  . Arthritis    . Diabetes    . Asthma     History  Substance Use Topics  . Smoking status: Current Every Day Smoker -- 1.00 packs/day for 30 years    Types: Cigarettes  . Smokeless tobacco: Never Used  . Alcohol Use: No    Review of Systems  HENT: Positive for congestion.   Respiratory: Positive for cough.   Neurological: Positive for seizures and headaches.  All other systems reviewed and are negative.     Allergies  Aspirin  Home Medications   Prior to Admission medications   Medication Sig Start Date End Date Taking? Authorizing  Provider  alprazolam Duanne Moron) 2 MG tablet Take 2 mg by mouth 3 (three) times daily as needed for anxiety.    Yes Historical Provider, MD  amLODipine (NORVASC) 10 MG tablet Take 10 mg by mouth daily.    Yes Historical Provider, MD  atenolol (TENORMIN) 100 MG tablet Take 100 mg by mouth daily.     Yes Historical Provider, MD  carisoprodol (SOMA) 350 MG tablet Take 350 mg by mouth 2 (two) times daily.    Yes Historical Provider, MD  divalproex (DEPAKOTE) 500 MG EC tablet Take 1,500 mg by mouth at bedtime.    Yes Historical Provider, MD  hydrochlorothiazide (HYDRODIURIL) 25 MG tablet Take 25 mg by mouth daily.   Yes Historical Provider, MD  insulin glargine (LANTUS) 100 UNIT/ML injection Inject 40 Units into the skin at bedtime. 09/10/13  Yes Rosita Fire, MD  levETIRAcetam (KEPPRA) 500 MG tablet Take 1,000 mg by mouth at bedtime.    Yes Historical Provider, MD  metFORMIN (GLUCOPHAGE) 1000 MG tablet Take 1,000 mg by mouth 2 (two) times daily.     Yes Historical Provider, MD  naproxen (NAPROSYN) 500 MG tablet Take 1 tablet (500 mg total) by mouth 2 (two) times daily. 03/12/14  Yes Evalee Jefferson, PA-C  OVER THE COUNTER MEDICATION Take 15 mLs by mouth once. For cough.   Yes Historical Provider, MD   BP 150/96  Pulse 74  Temp(Src) 98.2 F (36.8 C) (Oral)  Resp 18  Ht 5\' 6"  (1.676 m)  Wt 170 lb (77.111 kg)  BMI 27.45 kg/m2  SpO2 100% Physical Exam  Nursing note and vitals reviewed. Constitutional: He is oriented to person, place, and time. He appears well-developed and well-nourished. No distress.  HENT:  Head: Normocephalic and atraumatic.  Right Ear: Hearing normal.  Left Ear: Hearing normal.  Nose: Nose normal.  Mouth/Throat: Oropharynx is clear and moist and mucous membranes are normal.  Eyes: Conjunctivae and EOM are normal. Pupils are equal, round, and reactive to light.  Neck: Normal range of motion. Neck supple.  Cardiovascular: Regular rhythm, S1 normal and S2 normal.  Exam reveals no  gallop and no friction rub.   No murmur heard. Pulmonary/Chest: Effort normal and breath sounds normal. No respiratory distress. He exhibits no tenderness.  Abdominal: Soft. Normal appearance and bowel sounds are normal. There is no hepatosplenomegaly. There is no tenderness. There is no rebound, no guarding, no tenderness at McBurney's point and negative Murphy's sign. No hernia.  Musculoskeletal: Normal range of motion.  Neurological: He is alert and oriented to person, place, and time. He has normal strength. No cranial nerve deficit or sensory deficit. Coordination normal. GCS eye subscore is 4. GCS verbal subscore is 5. GCS motor subscore is 6.  Skin: Skin is warm, dry and intact. No rash noted. No cyanosis.  Psychiatric: He has a normal mood and affect. His speech is normal and behavior is normal. Thought content normal.    ED Course  Procedures (including critical care time)  DIAGNOSTIC STUDIES:  Oxygen Saturation is 100% on room air, normal by my interpretation.    COORDINATION OF CARE:  1:31 PM Discussed treatment plan with pt at bedside and pt agreed to plan.  Labs Review Labs Reviewed  CBG MONITORING, ED - Abnormal; Notable for the following:    Glucose-Capillary 350 (*)    All other components within normal limits    Imaging Review No results found.   EKG Interpretation None      MDM   Final diagnoses:  None  bronchitis Seizure   Patient presents to ER for evaluation after seizure. Patient has a history of seizure disorder. Patient does complain of headache, but he reports that this is typical for him after a seizure. He does not have any neurologic deficits on examination. He does require a CT scan at this time. Basic blood work was normal. Depakote level is slightly low, but he takes his medication once a day at nighttime, has a dose coming. Patient likely has lower his threshold slightly secondary to his upper respiratory infection. He has nasal congestion,  sore throat and cough. Will treat empirically for bronchitis. Follow up with primary doctor this week.  I personally performed the services described in this documentation, which was scribed in my presence. The recorded information has been reviewed and is accurate.      Orpah Greek, MD 04/12/14 [phone]

## 2014-04-12 NOTE — ED Notes (Signed)
Pt reports had a seizure x1 hour ago. Pt reports headache,fatigue at time of arrival. nad noted. Pt alert and oriented.

## 2014-04-12 NOTE — Discharge Instructions (Signed)
Epilepsy °Epilepsy is a disorder in which a person has repeated seizures over time. A seizure is a release of abnormal electrical activity in the brain. Seizures can cause a change in attention, behavior, or the ability to remain awake and alert (altered mental status). Seizures often involve uncontrollable shaking (convulsions).  °Most people with epilepsy lead normal lives. However, people with epilepsy are at an increased risk of falls, accidents, and injuries. Therefore, it is important to begin treatment right away. °CAUSES  °Epilepsy has many possible causes. Anything that disturbs the normal pattern of brain cell activity can lead to seizures. This may include:  °· Head injury. °· Birth trauma. °· High fever as a child. °· Stroke. °· Bleeding into or around the brain. °· Certain drugs. °· Prolonged low oxygen, such as what occurs after CPR efforts. °· Abnormal brain development. °· Certain illnesses, such as meningitis, encephalitis (brain infection), malaria, and other infections. °· An imbalance of nerve signaling chemicals (neurotransmitters).   °SIGNS AND SYMPTOMS  °The symptoms of a seizure can vary greatly from one person to another. Right before a seizure, you may have a warning (aura) that a seizure is about to occur. An aura may include the following symptoms: °· Fear or anxiety. °· Nausea. °· Feeling like the room is spinning (vertigo). °· Vision changes, such as seeing flashing lights or spots. °Common symptoms during a seizure include: °· Abnormal sensations, such as an abnormal smell or a bitter taste in the mouth.   °· Sudden, general body stiffness.   °· Convulsions that involve rhythmic jerking of the face, arm, or leg on one or both sides.   °· Sudden change in consciousness.   °¨ Appearing to be awake but not responding.   °¨ Appearing to be asleep but cannot be awakened.   °· Grimacing, chewing, lip smacking, drooling, tongue biting, or loss of bowel or bladder control. °After a seizure,  you may feel sleepy for a while.  °DIAGNOSIS  °Your health care provider will ask about your symptoms and take a medical history. Descriptions from any witnesses to your seizures will be very helpful in the diagnosis. A physical exam, including a detailed neurological exam, is necessary. Various tests may be done, such as:  °· An electroencephalogram (EEG). This is a painless test of your brain waves. In this test, a diagram is created of your brain waves. These diagrams can be interpreted by a specialist. °· An MRI of the brain.   °· A CT scan of the brain.   °· A spinal tap (lumbar puncture, LP). °· Blood tests to check for signs of infection or abnormal blood chemistry. °TREATMENT  °There is no cure for epilepsy, but it is generally treatable. Once epilepsy is diagnosed, it is important to begin treatment as soon as possible. For most people with epilepsy, seizures can be controlled with medicines. The following may also be used: °· A pacemaker for the brain (vagus nerve stimulator) can be used for people with seizures that are not well controlled by medicine. °· Surgery on the brain. °For some people, epilepsy eventually goes away. °HOME CARE INSTRUCTIONS  °· Follow your health care provider's recommendations on driving and safety in normal activities. °· Get enough rest. Lack of sleep can cause seizures. °· Only take over-the-counter or prescription medicines as directed by your health care provider. Take any prescribed medicine exactly as directed. °· Avoid any known triggers of your seizures. °· Keep a seizure diary. Record what you recall about any seizure, especially any possible trigger.   °· Make   sure the people you live and work with know that you are prone to seizures. They should receive instructions on how to help you. In general, a witness to a seizure should:   Cushion your head and body.   Turn you on your side.   Avoid unnecessarily restraining you.   Not place anything inside your  mouth.   Call for emergency medical help if there is any question about what has occurred.   Follow up with your health care provider as directed. You may need regular blood tests to monitor the levels of your medicine.  SEEK MEDICAL CARE IF:   You develop signs of infection or other illness. This might increase the risk of a seizure.   You seem to be having more frequent seizures.   Your seizure pattern is changing.  SEEK IMMEDIATE MEDICAL CARE IF:   You have a seizure that does not stop after a few moments.   You have a seizure that causes any difficulty in breathing.   You have a seizure that results in a very severe headache.   You have a seizure that leaves you with the inability to speak or use a part of your body.  Document Released: 07/18/2005 Document Revised: 05/08/2013 Document Reviewed: 02/27/2013 [facility] Patient Information 2015 [place], [place]. This information is not intended to replace advice given to you by your health care provider. Make sure you discuss any questions you have with your health care provider. Acute Bronchitis Bronchitis is inflammation of the airways that extend from the windpipe into the lungs (bronchi). The inflammation often causes mucus to develop. This leads to a cough, which is the most common symptom of bronchitis.  In acute bronchitis, the condition usually develops suddenly and goes away over time, usually in a couple weeks. Smoking, allergies, and asthma can make bronchitis worse. Repeated episodes of bronchitis may cause further lung problems.  CAUSES Acute bronchitis is most often caused by the same virus that causes a cold. The virus can spread from person to person (contagious) through coughing, sneezing, and touching contaminated objects. SIGNS AND SYMPTOMS   Cough.   Fever.   Coughing up mucus.   Body aches.   Chest congestion.   Chills.   Shortness of breath.   Sore throat.  DIAGNOSIS  Acute bronchitis  is usually diagnosed through a physical exam. Your health care provider will also ask you questions about your medical history. Tests, such as chest X-rays, are sometimes done to rule out other conditions.  TREATMENT  Acute bronchitis usually goes away in a couple weeks. Oftentimes, no medical treatment is necessary. Medicines are sometimes given for relief of fever or cough. Antibiotic medicines are usually not needed but may be prescribed in certain situations. In some cases, an inhaler may be recommended to help reduce shortness of breath and control the cough. A cool mist vaporizer may also be used to help thin bronchial secretions and make it easier to clear the chest.  HOME CARE INSTRUCTIONS  Get plenty of rest.   Drink enough fluids to keep your urine clear or pale yellow (unless you have a medical condition that requires fluid restriction). Increasing fluids may help thin your respiratory secretions (sputum) and reduce chest congestion, and it will prevent dehydration.   Take medicines only as directed by your health care provider.  If you were prescribed an antibiotic medicine, finish it all even if you start to feel better.  Avoid smoking and secondhand smoke. Exposure to cigarette smoke  or irritating chemicals will make bronchitis worse. If you are a smoker, consider using nicotine gum or skin patches to help control withdrawal symptoms. Quitting smoking will help your lungs heal faster.   Reduce the chances of another bout of acute bronchitis by washing your hands frequently, avoiding people with cold symptoms, and trying not to touch your hands to your mouth, nose, or eyes.   Keep all follow-up visits as directed by your health care provider.  SEEK MEDICAL CARE IF: Your symptoms do not improve after 1 week of treatment.  SEEK IMMEDIATE MEDICAL CARE IF:  You develop an increased fever or chills.   You have chest pain.   You have severe shortness of breath.  You have  bloody sputum.   You develop dehydration.  You faint or repeatedly feel like you are going to pass out.  You develop repeated vomiting.  You develop a severe headache. MAKE SURE YOU:   Understand these instructions.  Will watch your condition.  Will get help right away if you are not doing well or get worse. Document Released: 08/25/2004 Document Revised: 12/02/2013 Document Reviewed: 01/08/2013 [facility] Patient Information 2015 [place], [place]. This information is not intended to replace advice given to you by your health care provider. Make sure you discuss any questions you have with your health care provider.

## 2014-04-30 ENCOUNTER — Encounter (HOSPITAL_COMMUNITY): Payer: Self-pay | Admitting: Emergency Medicine

## 2014-04-30 ENCOUNTER — Emergency Department (HOSPITAL_COMMUNITY)
Admission: EM | Admit: 2014-04-30 | Discharge: 2014-04-30 | Disposition: A | Discharge status: Home / Self Care | Payer: Medicaid Other | Attending: Emergency Medicine | Admitting: Emergency Medicine

## 2014-04-30 DIAGNOSIS — Z792 Long term (current) use of antibiotics: Secondary | ICD-10-CM | POA: Diagnosis not present

## 2014-04-30 DIAGNOSIS — Z794 Long term (current) use of insulin: Secondary | ICD-10-CM | POA: Diagnosis not present

## 2014-04-30 DIAGNOSIS — R5381 Other malaise: Secondary | ICD-10-CM | POA: Diagnosis not present

## 2014-04-30 DIAGNOSIS — M549 Dorsalgia, unspecified: Secondary | ICD-10-CM | POA: Diagnosis not present

## 2014-04-30 DIAGNOSIS — E119 Type 2 diabetes mellitus without complications: Secondary | ICD-10-CM | POA: Insufficient documentation

## 2014-04-30 DIAGNOSIS — R5383 Other fatigue: Secondary | ICD-10-CM

## 2014-04-30 DIAGNOSIS — Z79899 Other long term (current) drug therapy: Secondary | ICD-10-CM | POA: Diagnosis not present

## 2014-04-30 DIAGNOSIS — Z791 Long term (current) use of non-steroidal anti-inflammatories (NSAID): Secondary | ICD-10-CM | POA: Diagnosis not present

## 2014-04-30 DIAGNOSIS — F319 Bipolar disorder, unspecified: Secondary | ICD-10-CM | POA: Insufficient documentation

## 2014-04-30 DIAGNOSIS — F209 Schizophrenia, unspecified: Secondary | ICD-10-CM | POA: Diagnosis not present

## 2014-04-30 DIAGNOSIS — G40909 Epilepsy, unspecified, not intractable, without status epilepticus: Secondary | ICD-10-CM | POA: Diagnosis not present

## 2014-04-30 DIAGNOSIS — R51 Headache: Secondary | ICD-10-CM | POA: Insufficient documentation

## 2014-04-30 DIAGNOSIS — R739 Hyperglycemia, unspecified: Secondary | ICD-10-CM

## 2014-04-30 DIAGNOSIS — G4730 Sleep apnea, unspecified: Secondary | ICD-10-CM | POA: Insufficient documentation

## 2014-04-30 DIAGNOSIS — F411 Generalized anxiety disorder: Secondary | ICD-10-CM | POA: Insufficient documentation

## 2014-04-30 DIAGNOSIS — F329 Major depressive disorder, single episode, unspecified: Secondary | ICD-10-CM | POA: Diagnosis not present

## 2014-04-30 DIAGNOSIS — F3289 Other specified depressive episodes: Secondary | ICD-10-CM | POA: Insufficient documentation

## 2014-04-30 DIAGNOSIS — R109 Unspecified abdominal pain: Secondary | ICD-10-CM | POA: Diagnosis present

## 2014-04-30 DIAGNOSIS — G4700 Insomnia, unspecified: Secondary | ICD-10-CM | POA: Insufficient documentation

## 2014-04-30 DIAGNOSIS — I10 Essential (primary) hypertension: Secondary | ICD-10-CM | POA: Insufficient documentation

## 2014-04-30 DIAGNOSIS — F17200 Nicotine dependence, unspecified, uncomplicated: Secondary | ICD-10-CM | POA: Diagnosis not present

## 2014-04-30 LAB — COMPREHENSIVE METABOLIC PANEL
ALBUMIN: 3.7 g/dL (ref 3.5–5.2)
ALT: <5 U/L (ref 0–53)
ANION GAP: 20 — AB (ref 5–15)
AST: 10 U/L (ref 0–37)
Alkaline Phosphatase: 61 U/L (ref 39–117)
BUN: 11 mg/dL (ref 6–23)
CO2: 19 mEq/L (ref 19–32)
Calcium: 9.1 mg/dL (ref 8.4–10.5)
Chloride: 100 mEq/L (ref 96–112)
Creatinine, Ser: 0.84 mg/dL (ref 0.50–1.35)
GFR calc Af Amer: >90 mL/min (ref >90)
GFR calc non Af Amer: >90 mL/min (ref >90)
Glucose, Bld: 273 mg/dL — ABNORMAL HIGH (ref 70–99)
Potassium: 3.7 mEq/L (ref 3.7–5.3)
SODIUM: 139 meq/L (ref 137–147)
TOTAL PROTEIN: 6.8 g/dL (ref 6.0–8.3)
Total Bilirubin: 0.4 mg/dL (ref 0.3–1.2)

## 2014-04-30 LAB — URINALYSIS, ROUTINE W REFLEX MICROSCOPIC
Bilirubin Urine: NEGATIVE
Glucose, UA: >1000 mg/dL — AB
HGB URINE DIPSTICK: NEGATIVE
Ketones, ur: >80 mg/dL — AB
Leukocytes, UA: NEGATIVE
NITRITE: NEGATIVE
PH: 6 (ref 5.0–8.0)
Protein, ur: NEGATIVE mg/dL
SPECIFIC GRAVITY, URINE: 1.01 (ref 1.005–1.030)
Urobilinogen, UA: 0.2 mg/dL (ref 0.0–1.0)

## 2014-04-30 LAB — URINE MICROSCOPIC-ADD ON

## 2014-04-30 LAB — CBC WITH DIFFERENTIAL/PLATELET
Basophils Absolute: 0 10*3/uL (ref 0.0–0.1)
Basophils Relative: 1 % (ref 0–1)
EOS PCT: 1 % (ref 0–5)
Eosinophils Absolute: 0 10*3/uL (ref 0.0–0.7)
HCT: 42.8 % (ref 39.0–52.0)
Hemoglobin: 15.8 g/dL (ref 13.0–17.0)
LYMPHS ABS: 2.4 10*3/uL (ref 0.7–4.0)
LYMPHS PCT: 55 % — AB (ref 12–46)
MCH: 32.1 pg (ref 26.0–34.0)
MCHC: 36.9 g/dL — ABNORMAL HIGH (ref 30.0–36.0)
MCV: 87 fL (ref 78.0–100.0)
MONO ABS: 0.3 10*3/uL (ref 0.1–1.0)
Monocytes Relative: 6 % (ref 3–12)
Neutro Abs: 1.6 10*3/uL — ABNORMAL LOW (ref 1.7–7.7)
Neutrophils Relative %: 37 % — ABNORMAL LOW (ref 43–77)
Platelets: 156 10*3/uL (ref 150–400)
RBC: 4.92 MIL/uL (ref 4.22–5.81)
RDW: 12.7 % (ref 11.5–15.5)
WBC: 4.4 10*3/uL (ref 4.0–10.5)

## 2014-04-30 LAB — CBG MONITORING, ED
GLUCOSE-CAPILLARY: 290 mg/dL — AB (ref 70–99)
GLUCOSE-CAPILLARY: 146 mg/dL — AB (ref 70–99)

## 2014-04-30 LAB — LACTIC ACID, PLASMA: Lactic Acid, Venous: 1.4 mmol/L (ref 0.5–2.2)

## 2014-04-30 MED ORDER — SODIUM CHLORIDE 0.9 % IV SOLN
1000.0000 mL | Freq: Once | INTRAVENOUS | Status: AC
  Administered 2014-04-30: 1000 mL via INTRAVENOUS

## 2014-04-30 MED ORDER — ONDANSETRON HCL 4 MG/2ML IJ SOLN
4.0000 mg | Freq: Once | INTRAMUSCULAR | Status: DC
  Filled 2014-04-30: qty 2

## 2014-04-30 MED ORDER — MORPHINE SULFATE 4 MG/ML IJ SOLN
4.0000 mg | Freq: Once | INTRAMUSCULAR | Status: AC
  Administered 2014-04-30: 4 mg via INTRAVENOUS
  Filled 2014-04-30: qty 1

## 2014-04-30 MED ORDER — SODIUM CHLORIDE 0.9 % IV SOLN: 1000.0000 mL | INTRAVENOUS | Status: DC

## 2014-04-30 MED ORDER — SODIUM CHLORIDE 0.9 % IV SOLN: 1000.0000 mL | Freq: Once | INTRAVENOUS | Status: DC

## 2014-04-30 MED ORDER — ONDANSETRON HCL 4 MG/2ML IJ SOLN
4.0000 mg | Freq: Once | INTRAMUSCULAR | Status: AC
  Administered 2014-04-30: 4 mg via INTRAVENOUS

## 2014-04-30 NOTE — Discharge Instructions (Signed)
Your glucose has improved from over 500 to less than 150. You have received IV fluids today. Your urine test is negative for infection or kidney stone. Please see Dr. Legrand Rams concerning the discomfort you have on your flank area. Please also discussed with Dr. Legrand Rams the elevations in your blood sugars. Please return to the emergency department if any emergent changes in her blood sugars were deterioration in her general condition. Hyperglycemia Hyperglycemia occurs when the glucose (sugar) in your blood is too high. Hyperglycemia can happen for many reasons, but it most often happens to people who do not know they have diabetes or are not managing their diabetes properly.  CAUSES  Whether you have diabetes or not, there are other causes of hyperglycemia. Hyperglycemia can occur when you have diabetes, but it can also occur in other situations that you might not be as aware of, such as: Diabetes  If you have diabetes and are having problems controlling your blood glucose, hyperglycemia could occur because of some of the following reasons:  Not following your meal plan.  Not taking your diabetes medications or not taking it properly.  Exercising less or doing less activity than you normally do.  Being sick. Pre-diabetes  This cannot be ignored. Before people develop Type 2 diabetes, they almost always have "pre-diabetes." This is when your blood glucose levels are higher than normal, but not yet high enough to be diagnosed as diabetes. Research has shown that some long-term damage to the body, especially the heart and circulatory system, may already be occurring during pre-diabetes. If you take action to manage your blood glucose when you have pre-diabetes, you may delay or prevent Type 2 diabetes from developing. Stress  If you have diabetes, you may be "diet" controlled or on oral medications or insulin to control your diabetes. However, you may find that your blood glucose is higher than usual in  the hospital whether you have diabetes or not. This is often referred to as "stress hyperglycemia." Stress can elevate your blood glucose. This happens because of hormones put out by the body during times of stress. If stress has been the cause of your high blood glucose, it can be followed regularly by your caregiver. That way he/she can make sure your hyperglycemia does not continue to get worse or progress to diabetes. Steroids  Steroids are medications that act on the infection fighting system (immune system) to block inflammation or infection. One side effect can be a rise in blood glucose. Most people can produce enough extra insulin to allow for this rise, but for those who cannot, steroids make blood glucose levels go even higher. It is not unusual for steroid treatments to "uncover" diabetes that is developing. It is not always possible to determine if the hyperglycemia will go away after the steroids are stopped. A special blood test called an A1c is sometimes done to determine if your blood glucose was elevated before the steroids were started. SYMPTOMS  Thirsty.  Frequent urination.  Dry mouth.  Blurred vision.  Tired or fatigue.  Weakness.  Sleepy.  Tingling in feet or leg. DIAGNOSIS  Diagnosis is made by monitoring blood glucose in one or all of the following ways:  A1c test. This is a chemical found in your blood.  Fingerstick blood glucose monitoring.  Laboratory results. TREATMENT  First, knowing the cause of the hyperglycemia is important before the hyperglycemia can be treated. Treatment may include, but is not be limited to:  Education.  Change or adjustment  in medications.  Change or adjustment in meal plan.  Treatment for an illness, infection, etc.  More frequent blood glucose monitoring.  Change in exercise plan.  Decreasing or stopping steroids.  Lifestyle changes. HOME CARE INSTRUCTIONS   Test your blood glucose as directed.  Exercise  regularly. Your caregiver will give you instructions about exercise. Pre-diabetes or diabetes which comes on with stress is helped by exercising.  Eat wholesome, balanced meals. Eat often and at regular, fixed times. Your caregiver or nutritionist will give you a meal plan to guide your sugar intake.  Being at an ideal weight is important. If needed, losing as little as 10 to 15 pounds may help improve blood glucose levels. SEEK MEDICAL CARE IF:   You have questions about medicine, activity, or diet.  You continue to have symptoms (problems such as increased thirst, urination, or weight gain). SEEK IMMEDIATE MEDICAL CARE IF:   You are vomiting or have diarrhea.  Your breath smells fruity.  You are breathing faster or slower.  You are very sleepy or incoherent.  You have numbness, tingling, or pain in your feet or hands.  You have chest pain.  Your symptoms get worse even though you have been following your caregiver's orders.  If you have any other questions or concerns. Document Released: 01/11/2001 Document Revised: 10/10/2011 Document Reviewed: 11/14/2011 [facility] Patient Information 2015 [place], [place]. This information is not intended to replace advice given to you by your health care provider. Make sure you discuss any questions you have with your health care provider.

## 2014-04-30 NOTE — ED Provider Notes (Signed)
Medical screening examination/treatment/procedure(s) were performed by non-physician practitioner and as supervising physician I was immediately available for consultation/collaboration.  Carmin Muskrat, MD 04/30/14 1550

## 2014-04-30 NOTE — ED Notes (Signed)
Pt complain of pain in left flank area, frequent urination and high blood sugar. States his CBG was 500 this morning and he gave himself 20 units of novolog

## 2014-04-30 NOTE — ED Notes (Signed)
Patient ambulated very well on his on, he just states that its minor pain on his left side.

## 2014-04-30 NOTE — ED Provider Notes (Signed)
CSN: [id number]     Arrival date & time 04/30/14  0805 History   First MD Initiated Contact with Patient 04/30/14 [phone]     No chief complaint on file.    (Consider location/radiation/quality/duration/timing/severity/associated sxs/prior Treatment) HPI Comments: The patient is a 48 year old male who presents to the emergency department with a complaint of my sugar is up, and 9 left back hurts.  Patient states that he was out of his insulin on Monday, September 28 he did resume his insulin on Tuesday, September 29. He states that on last evening he was going to the bathroom frequently and with large amounts of urine. He states that he was also having pain in his left flank and back area. He denies any recent injury or trauma to the area. There was no blood in the urine reported. He denies any known high fevers. It is also of note that there was no nausea, vomiting, diarrhea, or abdominal pain related to this episode. The patient continued to have pain in his flank area and so he came to the emergency department for evaluation. The patient also came because his sugar was over 500 this morning, and when he contacted the doctor's office they were told to come to the emergency department for assistance with this problem. The patient on 20 units of NovoLog insulin, he has not taken anything else on this morning.  The history is provided by the patient.    Past Medical History  Diagnosis Date  . Seizures   . Anxiety   . Depression   . Diabetes mellitus   . HTN (hypertension)   . Sleep apnea   . Bipolar 1 disorder   . Insomnia   . Headache(784.0)   . Schizophrenia    Past Surgical History  Procedure Laterality Date  . Mandible fracture surgery    . Knee surgery    . Tonsillectomy     Family History  Problem Relation Age of Onset  . Arthritis    . Diabetes    . Asthma     History  Substance Use Topics  . Smoking status: Current Every Day Smoker -- 1.00 packs/day for 30 years   Types: Cigarettes  . Smokeless tobacco: Never Used  . Alcohol Use: No    Review of Systems  Constitutional: Positive for fatigue. Negative for activity change.       All ROS Neg except as noted in HPI  Eyes: Negative for photophobia and discharge.  Respiratory: Negative for cough, shortness of breath and wheezing.   Cardiovascular: Negative for chest pain and palpitations.  Gastrointestinal: Negative for nausea, vomiting, abdominal pain, diarrhea and blood in stool.  Genitourinary: Positive for frequency and flank pain. Negative for dysuria and hematuria.  Musculoskeletal: Positive for back pain. Negative for arthralgias and neck pain.  Skin: Negative.   Neurological: Positive for headaches. Negative for dizziness, seizures, syncope and speech difficulty.  Psychiatric/Behavioral: Negative for hallucinations and confusion. The patient is nervous/anxious.       Allergies  Aspirin  Home Medications   Prior to Admission medications   Medication Sig Start Date End Date Taking? Authorizing Provider  alprazolam Duanne Moron) 2 MG tablet Take 2 mg by mouth 3 (three) times daily as needed for anxiety.     Historical Provider, MD  amLODipine (NORVASC) 10 MG tablet Take 10 mg by mouth daily.     Historical Provider, MD  amoxicillin (AMOXIL) 500 MG capsule Take 1 capsule (500 mg total) by mouth 3 (three) times daily.  04/12/14   Orpah Greek, MD  atenolol (TENORMIN) 100 MG tablet Take 100 mg by mouth daily.      Historical Provider, MD  carisoprodol (SOMA) 350 MG tablet Take 350 mg by mouth 2 (two) times daily.     Historical Provider, MD  divalproex (DEPAKOTE) 500 MG EC tablet Take 1,500 mg by mouth at bedtime.     Historical Provider, MD  hydrochlorothiazide (HYDRODIURIL) 25 MG tablet Take 25 mg by mouth daily.    Historical Provider, MD  insulin glargine (LANTUS) 100 UNIT/ML injection Inject 40 Units into the skin at bedtime. 09/10/13   Rosita Fire, MD  levETIRAcetam (KEPPRA) 500 MG  tablet Take 1,000 mg by mouth at bedtime.     Historical Provider, MD  metFORMIN (GLUCOPHAGE) 1000 MG tablet Take 1,000 mg by mouth 2 (two) times daily.      Historical Provider, MD  naproxen (NAPROSYN) 500 MG tablet Take 1 tablet (500 mg total) by mouth 2 (two) times daily. 03/12/14   Evalee Jefferson, PA-C  OVER THE COUNTER MEDICATION Take 15 mLs by mouth once. For cough.    Historical Provider, MD   BP 162/111  Pulse 76  Temp(Src) 98.6 F (37 C) (Oral)  Resp 22  Ht 5\' 6"  (1.676 m)  Wt 190 lb (86.183 kg)  BMI 30.68 kg/m2  SpO2 98% Physical Exam  Nursing note and vitals reviewed. Constitutional: He is oriented to person, place, and time. He appears well-developed and well-nourished.  Non-toxic appearance.  HENT:  Head: Normocephalic.  Right Ear: Tympanic membrane and external ear normal.  Left Ear: Tympanic membrane and external ear normal.  Eyes: EOM and lids are normal. Pupils are equal, round, and reactive to light.  Neck: Normal range of motion. Neck supple. Carotid bruit is not present.  Cardiovascular: Normal rate, regular rhythm, normal heart sounds, intact distal pulses and normal pulses.   Pulmonary/Chest: Breath sounds normal. No respiratory distress.  Abdominal: Soft. Bowel sounds are normal. He exhibits no distension. There is no tenderness. There is no rebound and no guarding.  Left CVA tenderness present.  Musculoskeletal: Normal range of motion. He exhibits no edema.  Lymphadenopathy:       Head (right side): No submandibular adenopathy present.       Head (left side): No submandibular adenopathy present.    He has no cervical adenopathy.  Neurological: He is alert and oriented to person, place, and time. He has normal strength. No cranial nerve deficit or sensory deficit.  Skin: Skin is warm and dry.  Psychiatric: He has a normal mood and affect. His speech is normal.    ED Course  Procedures (including critical care time) Labs Review Labs Reviewed  CBG MONITORING,  ED - Abnormal; Notable for the following:    Glucose-Capillary 290 (*)    All other components within normal limits  COMPREHENSIVE METABOLIC PANEL  URINALYSIS, ROUTINE W REFLEX MICROSCOPIC  LACTIC ACID, PLASMA  CBC WITH DIFFERENTIAL    Imaging Review No results found.   EKG Interpretation None      MDM  Patient feeling better after IV fluids and IV morphine.  CBG in the emergency department was 290. Lactic acid was normal at 1.4. Urine analysis showed a clear yellow specimen with a specific gravity of 1.010. The glucose is elevated at  Greater than 1000 mg per decaliter, the key tones are elevated at 80 mg per decaliter. The remainder the urine is within normal limits. Comprehensive metabolic panel shows a glucose to  be elevated at 273 with an elevation in the anion gap of 20, otherwise within normal limits. The complete blood count was within normal limits.  The patient received a little over a liter of IV fluids, and the glucose came down to 146 at the time of discharge. The patient is ambulatory with minimal problem, he does still have some mild left flank area discomfort.  I discuss the findings with the patient. I reassured him of the improvement in his glucose, as well as talk with him about the importance of maintaining continuing on that medicine coverage, particularly with his insulin. I have encouraged the patient to talk with Dr. Legrand Rams concerning the changes in his blood sugars, as well as the flank area pain. The patient ate knowledge is understanding of these discharge instructions. The patient is to return to the emergency department if any changes, problems, or concerns.    Final diagnoses:  None    **I have reviewed nursing notes, vital signs, and all appropriate lab and imaging results for this patient.Lenox Ahr, PA-C 04/30/14 [phone]

## 2014-05-06 ENCOUNTER — Emergency Department (HOSPITAL_COMMUNITY): Payer: Medicaid Other

## 2014-05-06 ENCOUNTER — Encounter (HOSPITAL_COMMUNITY): Payer: Self-pay | Admitting: Emergency Medicine

## 2014-05-06 ENCOUNTER — Emergency Department (HOSPITAL_COMMUNITY)
Admission: EM | Admit: 2014-05-06 | Discharge: 2014-05-06 | Disposition: A | Discharge status: Home / Self Care | Payer: Medicaid Other | Attending: Emergency Medicine | Admitting: Emergency Medicine

## 2014-05-06 DIAGNOSIS — F419 Anxiety disorder, unspecified: Secondary | ICD-10-CM | POA: Insufficient documentation

## 2014-05-06 DIAGNOSIS — Z8669 Personal history of other diseases of the nervous system and sense organs: Secondary | ICD-10-CM | POA: Insufficient documentation

## 2014-05-06 DIAGNOSIS — F319 Bipolar disorder, unspecified: Secondary | ICD-10-CM | POA: Insufficient documentation

## 2014-05-06 DIAGNOSIS — Z79899 Other long term (current) drug therapy: Secondary | ICD-10-CM | POA: Diagnosis not present

## 2014-05-06 DIAGNOSIS — R569 Unspecified convulsions: Secondary | ICD-10-CM

## 2014-05-06 DIAGNOSIS — M5416 Radiculopathy, lumbar region: Secondary | ICD-10-CM | POA: Diagnosis not present

## 2014-05-06 DIAGNOSIS — I10 Essential (primary) hypertension: Secondary | ICD-10-CM | POA: Diagnosis not present

## 2014-05-06 DIAGNOSIS — Z794 Long term (current) use of insulin: Secondary | ICD-10-CM | POA: Insufficient documentation

## 2014-05-06 DIAGNOSIS — E119 Type 2 diabetes mellitus without complications: Secondary | ICD-10-CM | POA: Insufficient documentation

## 2014-05-06 DIAGNOSIS — G40909 Epilepsy, unspecified, not intractable, without status epilepticus: Secondary | ICD-10-CM | POA: Diagnosis not present

## 2014-05-06 DIAGNOSIS — Z720 Tobacco use: Secondary | ICD-10-CM | POA: Diagnosis not present

## 2014-05-06 DIAGNOSIS — G8929 Other chronic pain: Secondary | ICD-10-CM

## 2014-05-06 LAB — CBG MONITORING, ED: GLUCOSE-CAPILLARY: 301 mg/dL — AB (ref 70–99)

## 2014-05-06 MED ORDER — LEVETIRACETAM IN NACL 500 MG/100ML IV SOLN
500.0000 mg | Freq: Once | INTRAVENOUS | Status: AC
  Administered 2014-05-06: 500 mg via INTRAVENOUS
  Filled 2014-05-06: qty 100

## 2014-05-06 MED ORDER — CYCLOBENZAPRINE HCL 10 MG PO TABS: 10.0000 mg | ORAL_TABLET | Freq: Three times a day (TID) | ORAL | Status: DC | PRN

## 2014-05-06 MED ORDER — LEVETIRACETAM IN NACL 500 MG/100ML IV SOLN
INTRAVENOUS | Status: AC
  Filled 2014-05-06: qty 100

## 2014-05-06 MED ORDER — LEVETIRACETAM 500 MG PO TABS: 500.0000 mg | ORAL_TABLET | Freq: Three times a day (TID) | ORAL | Status: DC

## 2014-05-06 MED ORDER — DIPHENHYDRAMINE HCL 50 MG/ML IJ SOLN
25.0000 mg | Freq: Once | INTRAMUSCULAR | Status: AC
  Administered 2014-05-06: 25 mg via INTRAVENOUS
  Filled 2014-05-06: qty 1

## 2014-05-06 MED ORDER — METOCLOPRAMIDE HCL 5 MG/ML IJ SOLN
10.0000 mg | Freq: Once | INTRAMUSCULAR | Status: AC
  Administered 2014-05-06: 10 mg via INTRAVENOUS
  Filled 2014-05-06: qty 2

## 2014-05-06 MED ORDER — FENTANYL CITRATE 0.05 MG/ML IJ SOLN
50.0000 ug | Freq: Once | INTRAMUSCULAR | Status: AC
  Administered 2014-05-06: 50 ug via INTRAVENOUS
  Filled 2014-05-06: qty 2

## 2014-05-06 NOTE — ED Provider Notes (Signed)
CSN: [id number]     Arrival date & time 05/06/14  1534 History   First MD Initiated Contact with Patient 05/06/14 [phone]     Chief Complaint  Patient presents with  . Seizures     (Consider location/radiation/quality/duration/timing/severity/associated sxs/prior Treatment) HPI Patient states that he ran out of his seizure medicine 4 days ago and his neurologist did not refill them. He states he normally takes Depakote 500 mg 3 times a day. He states that before his seizures he has a aura where he has difficulty talking and has stuttering. He states he also smells funny smells. He states it happened tonight and then his caregiver told him he had 4 seizures. He states he has a headache on the left side of his head now which is typical after having had a seizure. He describes it as a throbbing pain. He states when he had a seizure he fell and hit his back. Patient has chronic back pain with radiation to his left leg. He states it feels worse tonight with pain in the same area as usual.  PCP Dr Legrand Rams Neurology Dr Merlene Laughter  Past Medical History  Diagnosis Date  . Seizures   . Anxiety   . Depression   . Diabetes mellitus   . HTN (hypertension)   . Sleep apnea   . Bipolar 1 disorder   . Insomnia   . Headache(784.0)   . Schizophrenia    Past Surgical History  Procedure Laterality Date  . Mandible fracture surgery    . Knee surgery    . Tonsillectomy     Family History  Problem Relation Age of Onset  . Arthritis    . Diabetes    . Asthma     History  Substance Use Topics  . Smoking status: Current Every Day Smoker -- 1.00 packs/day for 30 years    Types: Cigarettes  . Smokeless tobacco: Never Used  . Alcohol Use: No   patient has an aide come to his house Monday through Friday to clean and cook. He denies any street drug use at this time  Review of Systems  All other systems reviewed and are negative.     Allergies  Aspirin  Home Medications   Prior to Admission  medications   Medication Sig Start Date End Date Taking? Authorizing Provider  alprazolam Duanne Moron) 2 MG tablet Take 2 mg by mouth 3 (three) times daily as needed for anxiety.    Yes Historical Provider, MD  amLODipine (NORVASC) 10 MG tablet Take 10 mg by mouth daily.    Yes Historical Provider, MD  atenolol (TENORMIN) 100 MG tablet Take 100 mg by mouth daily.     Yes Historical Provider, MD  carisoprodol (SOMA) 350 MG tablet Take 700 mg by mouth at bedtime.    Yes Historical Provider, MD  divalproex (DEPAKOTE) 500 MG EC tablet Take 1,500 mg by mouth at bedtime.    Yes Historical Provider, MD  hydrochlorothiazide (HYDRODIURIL) 25 MG tablet Take 25 mg by mouth daily.   Yes Historical Provider, MD  insulin glargine (LANTUS) 100 UNIT/ML injection Inject 40 Units into the skin at bedtime. 09/10/13  Yes Rosita Fire, MD  levETIRAcetam (KEPPRA) 500 MG tablet Take 1,000 mg by mouth at bedtime.    Yes Historical Provider, MD  metFORMIN (GLUCOPHAGE) 1000 MG tablet Take 1,000 mg by mouth 2 (two) times daily.     Yes Historical Provider, MD   BP 133/100  Pulse 80  Temp(Src) 99.1 F (37.3 C) (  Oral)  Resp 16  Ht 5\' 7"  (1.702 m)  Wt 190 lb (86.183 kg)  BMI 29.75 kg/m2  SpO2 100%  Vital signs normal   Physical Exam  Nursing note and vitals reviewed. Constitutional: He is oriented to person, place, and time. He appears well-developed and well-nourished.  Non-toxic appearance. He does not appear ill. No distress.  HENT:  Head: Normocephalic and atraumatic.  Right Ear: External ear normal.  Left Ear: External ear normal.  Nose: Nose normal. No mucosal edema or rhinorrhea.  Mouth/Throat: Oropharynx is clear and moist and mucous membranes are normal. No dental abscesses or uvula swelling.  Eyes: Conjunctivae and EOM are normal. Pupils are equal, round, and reactive to light.  Neck: Normal range of motion and full passive range of motion without pain. Neck supple.  Cardiovascular: Normal rate, regular  rhythm and normal heart sounds.  Exam reveals no gallop and no friction rub.   No murmur heard. Pulmonary/Chest: Effort normal and breath sounds normal. No respiratory distress. He has no wheezes. He has no rhonchi. He has no rales. He exhibits no tenderness and no crepitus.  Abdominal: Soft. Normal appearance and bowel sounds are normal. He exhibits no distension. There is no tenderness. There is no rebound and no guarding.  Musculoskeletal: Normal range of motion. He exhibits tenderness. He exhibits no edema.       Back:  Moves all extremities well. Patient has tenderness in his lower sacral area on the left  Neurological: He is alert and oriented to person, place, and time. He has normal strength. No cranial nerve deficit.  Skin: Skin is warm, dry and intact. No rash noted. No erythema. No pallor.  Psychiatric: He has a normal mood and affect. His speech is normal and behavior is normal. His mood appears not anxious.    ED Course  Procedures (including critical care time) Labs Review Results for orders placed during the hospital encounter of 05/06/14  CBG MONITORING, ED      Result Value Ref Range   Glucose-Capillary 301 (*) 70 - 99 mg/dL    Laboratory interpretation all normal except hyperglycemia   Imaging Review Dg Lumbar Spine Complete  05/06/2014   CLINICAL DATA:  Seizure earlier today, causing the patient to fall, striking his back on a table. Initial encounter.  EXAM: LUMBAR SPINE - COMPLETE 4+ VIEW  COMPARISON:  Lumbar spine x-rays 03/13/2012. Bone window images from CT abdomen pelvis 02/20/2012. MRI lumbar spine 06/19/2009.  FINDINGS: Six non rib-bearing lumbar vertebrae, with L6 representing a transitional lumbosacral segment. Prior imaging confirmed twelve rib-bearing thoracic vertebrae. Therefore, the lumbar vertebrae will be designated L1 through L6. If there are imaging studies elsewhere with a different numbering scheme, please adjust accordingly. Of note, the  transitional L6 segment has a well defined assimilation joint between its left transverse process and the 1st sacral a ala.  Anatomic alignment. No fractures. Well preserved disc spaces. No pars defects. No significant facet arthropathy. Visualized sacroiliac joints intact. No significant degenerative changes involving the assimilation joint at L6-S1.  Curvilinear calcification at the T12-L1 level on the left was shown on the prior CT to represent calcification within the abdominal aorta.  IMPRESSION: 1. No acute or significant osseous abnormality. 2. 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 (Prior imaging has confirmed that there are 12 rib-bearing thoracic vertebrae).   Electronically Signed   By:  Evangeline Dakin M.D.   On: 05/06/2014 19:21     EKG Interpretation None      MDM   Final diagnoses:  Seizure  Chronic radicular pain of lower back    New Prescriptions   CYCLOBENZAPRINE (FLEXERIL) 10 MG TABLET    Take 1 tablet (10 mg total) by mouth 3 (three) times daily as needed for muscle spasms (and back pain).   LEVETIRACETAM (KEPPRA) 500 MG TABLET    Take 1 tablet (500 mg total) by mouth 3 (three) times daily.    Plan discharge   Rolland Porter, MD, Alanson Aly, MD 05/06/14 2031

## 2014-05-06 NOTE — Discharge Instructions (Signed)
Restart your keppra. Try ice and heat to your back. Take the flexeril for pain. Recheck as needed.

## 2014-05-06 NOTE — ED Notes (Addendum)
Pt states he ran out of seizure medication on Friday and Dr. Merlene Laughter never faxed Rx in to pharmacy, per pt. States he had a seizure today and is now having back pain from the fall. Pt also states headache.

## 2014-05-15 ENCOUNTER — Emergency Department (HOSPITAL_COMMUNITY)
Admission: EM | Admit: 2014-05-15 | Discharge: 2014-05-15 | Disposition: A | Discharge status: Home / Self Care | Payer: MEDICAID | Attending: Emergency Medicine | Admitting: Emergency Medicine

## 2014-05-15 ENCOUNTER — Emergency Department (HOSPITAL_COMMUNITY): Payer: MEDICAID

## 2014-05-15 ENCOUNTER — Encounter (HOSPITAL_COMMUNITY): Payer: Self-pay | Admitting: Emergency Medicine

## 2014-05-15 DIAGNOSIS — E119 Type 2 diabetes mellitus without complications: Secondary | ICD-10-CM | POA: Diagnosis not present

## 2014-05-15 DIAGNOSIS — Z79899 Other long term (current) drug therapy: Secondary | ICD-10-CM | POA: Insufficient documentation

## 2014-05-15 DIAGNOSIS — Z794 Long term (current) use of insulin: Secondary | ICD-10-CM | POA: Diagnosis not present

## 2014-05-15 DIAGNOSIS — F209 Schizophrenia, unspecified: Secondary | ICD-10-CM | POA: Insufficient documentation

## 2014-05-15 DIAGNOSIS — Z720 Tobacco use: Secondary | ICD-10-CM | POA: Insufficient documentation

## 2014-05-15 DIAGNOSIS — F419 Anxiety disorder, unspecified: Secondary | ICD-10-CM

## 2014-05-15 DIAGNOSIS — I10 Essential (primary) hypertension: Secondary | ICD-10-CM | POA: Diagnosis not present

## 2014-05-15 DIAGNOSIS — F319 Bipolar disorder, unspecified: Secondary | ICD-10-CM | POA: Diagnosis not present

## 2014-05-15 DIAGNOSIS — G40909 Epilepsy, unspecified, not intractable, without status epilepticus: Secondary | ICD-10-CM | POA: Insufficient documentation

## 2014-05-15 LAB — URINALYSIS, ROUTINE W REFLEX MICROSCOPIC
Bilirubin Urine: NEGATIVE
Glucose, UA: >1000 mg/dL — AB
Hgb urine dipstick: NEGATIVE
KETONES UR: 40 mg/dL — AB
LEUKOCYTES UA: NEGATIVE
Nitrite: NEGATIVE
PH: 6 (ref 5.0–8.0)
Protein, ur: NEGATIVE mg/dL
Specific Gravity, Urine: 1.015 (ref 1.005–1.030)
UROBILINOGEN UA: 0.2 mg/dL (ref 0.0–1.0)

## 2014-05-15 LAB — BASIC METABOLIC PANEL
Anion gap: 15 (ref 5–15)
BUN: 12 mg/dL (ref 6–23)
CHLORIDE: 98 meq/L (ref 96–112)
CO2: 29 meq/L (ref 19–32)
Calcium: 9.9 mg/dL (ref 8.4–10.5)
Creatinine, Ser: 0.9 mg/dL (ref 0.50–1.35)
GFR calc non Af Amer: >90 mL/min (ref >90)
Glucose, Bld: 282 mg/dL — ABNORMAL HIGH (ref 70–99)
Potassium: 4.5 mEq/L (ref 3.7–5.3)
Sodium: 142 mEq/L (ref 137–147)

## 2014-05-15 LAB — CBC WITH DIFFERENTIAL/PLATELET
BASOS ABS: 0 10*3/uL (ref 0.0–0.1)
Basophils Relative: 0 % (ref 0–1)
EOS PCT: 0 % (ref 0–5)
Eosinophils Absolute: 0 10*3/uL (ref 0.0–0.7)
HCT: 45.3 % (ref 39.0–52.0)
Hemoglobin: 16.6 g/dL (ref 13.0–17.0)
LYMPHS PCT: 50 % — AB (ref 12–46)
Lymphs Abs: 2.9 10*3/uL (ref 0.7–4.0)
MCH: 32.3 pg (ref 26.0–34.0)
MCHC: 36.6 g/dL — ABNORMAL HIGH (ref 30.0–36.0)
MCV: 88.1 fL (ref 78.0–100.0)
Monocytes Absolute: 0.4 10*3/uL (ref 0.1–1.0)
Monocytes Relative: 6 % (ref 3–12)
NEUTROS ABS: 2.5 10*3/uL (ref 1.7–7.7)
Neutrophils Relative %: 43 % (ref 43–77)
PLATELETS: 155 10*3/uL (ref 150–400)
RBC: 5.14 MIL/uL (ref 4.22–5.81)
RDW: 12.8 % (ref 11.5–15.5)
WBC: 5.8 10*3/uL (ref 4.0–10.5)

## 2014-05-15 LAB — CBG MONITORING, ED: Glucose-Capillary: 306 mg/dL — ABNORMAL HIGH (ref 70–99)

## 2014-05-15 LAB — URINE MICROSCOPIC-ADD ON

## 2014-05-15 MED ORDER — ALPRAZOLAM 2 MG PO TABS: 2.0000 mg | ORAL_TABLET | Freq: Three times a day (TID) | ORAL | Status: DC | PRN

## 2014-05-15 MED ORDER — LORAZEPAM 1 MG PO TABS
1.0000 mg | ORAL_TABLET | Freq: Once | ORAL | Status: AC
  Administered 2014-05-15: 1 mg via ORAL
  Filled 2014-05-15: qty 1

## 2014-05-15 MED ORDER — ALPRAZOLAM 1 MG PO TABS: 1.0000 mg | ORAL_TABLET | Freq: Three times a day (TID) | ORAL | Status: DC | PRN

## 2014-05-15 NOTE — Discharge Instructions (Signed)
Panic Attacks Follow up with your doctor as scheduled. Return to the ED if you develop new or worsening symptoms. Panic attacks are sudden, short-livedsurges of severe anxiety, fear, or discomfort. They may occur for no reason when you are relaxed, when you are anxious, or when you are sleeping. Panic attacks may occur for a number of reasons:   Healthy people occasionally have panic attacks in extreme, life-threatening situations, such as war or natural disasters. Normal anxiety is a protective mechanism of the body that helps [place] react to danger (fight or flight response).  Panic attacks are often seen with anxiety disorders, such as panic disorder, social anxiety disorder, generalized anxiety disorder, and phobias. Anxiety disorders cause excessive or uncontrollable anxiety. They may interfere with your relationships or other life activities.  Panic attacks are sometimes seen with other mental illnesses, such as depression and posttraumatic stress disorder.  Certain medical conditions, prescription medicines, and drugs of abuse can cause panic attacks. SYMPTOMS  Panic attacks start suddenly, peak within 20 minutes, and are accompanied by four or more of the following symptoms:  Pounding heart or fast heart rate (palpitations).  Sweating.  Trembling or shaking.  Shortness of breath or feeling smothered.  Feeling choked.  Chest pain or discomfort.  Nausea or strange feeling in your stomach.  Dizziness, light-headedness, or feeling like you will faint.  Chills or hot flushes.  Numbness or tingling in your lips or hands and feet.  Feeling that things are not real or feeling that you are not yourself.  Fear of losing control or going crazy.  Fear of dying. Some of these symptoms can mimic serious medical conditions. For example, you may think you are having a heart attack. Although panic attacks can be very scary, they are not life threatening. DIAGNOSIS  Panic attacks are  diagnosed through an assessment by your health care provider. Your health care provider will ask questions about your symptoms, such as where and when they occurred. Your health care provider will also ask about your medical history and use of alcohol and drugs, including prescription medicines. Your health care provider may order blood tests or other studies to rule out a serious medical condition. Your health care provider may refer you to a mental health professional for further evaluation. TREATMENT   Most healthy people who have one or two panic attacks in an extreme, life-threatening situation will not require treatment.  The treatment for panic attacks associated with anxiety disorders or other mental illness typically involves counseling with a mental health professional, medicine, or a combination of both. Your health care provider will help determine what treatment is best for you.  Panic attacks due to physical illness usually go away with treatment of the illness. If prescription medicine is causing panic attacks, talk with your health care provider about stopping the medicine, decreasing the dose, or substituting another medicine.  Panic attacks due to alcohol or drug abuse go away with abstinence. Some adults need professional help in order to stop drinking or using drugs. HOME CARE INSTRUCTIONS   Take all medicines as directed by your health care provider.   Schedule and attend follow-up visits as directed by your health care provider. It is important to keep all your appointments. SEEK MEDICAL CARE IF:  You are not able to take your medicines as prescribed.  Your symptoms do not improve or get worse. SEEK IMMEDIATE MEDICAL CARE IF:   You experience panic attack symptoms that are different than your usual symptoms.  You have serious thoughts about hurting yourself or others.  You are taking medicine for panic attacks and have a serious side effect. MAKE SURE  YOU:  Understand these instructions.  Will watch your condition.  Will get help right away if you are not doing well or get worse. Document Released: 07/18/2005 Document Revised: 07/23/2013 Document Reviewed: 03/01/2013 [facility] Patient Information 2015 [place], [place]. This information is not intended to replace advice given to you by your health care provider. Make sure you discuss any questions you have with your health care provider.

## 2014-05-15 NOTE — ED Provider Notes (Signed)
CSN: [id number]     Arrival date & time 05/15/14  1157 History  This chart was scribed for Ezequiel Essex, MD by Rayfield Citizen, ED Scribe. This patient was seen in room APA18/APA18 and the patient's care was started at 1:27 PM.    Chief Complaint  Patient presents with  . Anxiety   The history is provided by the patient. No language interpreter was used.   HPI Comments: Melvin Taylor is a 48 y.o. male who presents to the Emergency Department in need of a medication refill. He states that he has been out of medication for 5days and is not due to the doctor for 6 more days. He reports that due to some complications at the pharmacy, he ran out of his Xanax. He takes Depakote and Keppra as well, but is taking those as prescribed at this time without issue. He reports that when he is off his medications he is very upset; he also has auditory hallucinations, which are not present during exam. He denies SI or HI or specific plans at this time.   He reports a headache that began when he started to cry this morning. He had a nightmare, followed by a conversation with his sister that prompted an emotional episode. He called the ambulance when he couldn't stop crying. He has a history of headaches which he treats with Percocet. He denies any other pain  His last seizure was this past weekend. He reports that he has been taking his medications as prescribed.   He denies EtOH or drug use.   He has epilepsy, high blood pressure, diabetes. He utilizes a sleep apnea machine.   Past Medical History  Diagnosis Date  . Seizures   . Anxiety   . Depression   . Diabetes mellitus   . HTN (hypertension)   . Sleep apnea   . Bipolar 1 disorder   . Insomnia   . Headache(784.0)   . Schizophrenia    Past Surgical History  Procedure Laterality Date  . Mandible fracture surgery    . Knee surgery    . Tonsillectomy     Family History  Problem Relation Age of Onset  . Arthritis    . Diabetes    .  Asthma     History  Substance Use Topics  . Smoking status: Current Every Day Smoker -- 1.00 packs/day for 30 years    Types: Cigarettes  . Smokeless tobacco: Never Used  . Alcohol Use: No    Review of Systems  A complete 10 system review of systems was obtained and all systems are negative except as noted in the HPI and PMH.    Allergies  Aspirin  Home Medications   Prior to Admission medications   Medication Sig Start Date End Date Taking? Authorizing Provider  alprazolam Duanne Moron) 2 MG tablet Take 2 mg by mouth 3 (three) times daily as needed for anxiety.    Yes Historical Provider, MD  amLODipine (NORVASC) 10 MG tablet Take 10 mg by mouth daily.    Yes Historical Provider, MD  atenolol (TENORMIN) 100 MG tablet Take 100 mg by mouth daily.     Yes Historical Provider, MD  carisoprodol (SOMA) 350 MG tablet Take 700 mg by mouth at bedtime.    Yes Historical Provider, MD  cyclobenzaprine (FLEXERIL) 10 MG tablet Take 1 tablet (10 mg total) by mouth 3 (three) times daily as needed for muscle spasms (and back pain). 05/06/14  Yes Janice Norrie, MD  divalproex (DEPAKOTE) 500 MG EC tablet Take 1,500 mg by mouth at bedtime.    Yes Historical Provider, MD  hydrochlorothiazide (HYDRODIURIL) 25 MG tablet Take 25 mg by mouth daily.   Yes Historical Provider, MD  insulin glargine (LANTUS) 100 UNIT/ML injection Inject 40 Units into the skin at bedtime. 09/10/13  Yes Rosita Fire, MD  levETIRAcetam (KEPPRA) 500 MG tablet Take 1,500 mg by mouth at bedtime.    Yes Historical Provider, MD  metFORMIN (GLUCOPHAGE) 1000 MG tablet Take 1,000 mg by mouth 2 (two) times daily.     Yes Historical Provider, MD  ALPRAZolam Duanne Moron) 1 MG tablet Take 1 tablet (1 mg total) by mouth 3 (three) times daily as needed for anxiety. 05/15/14   Ezequiel Essex, MD   BP 133/100  Pulse 64  Temp(Src) 99.2 F (37.3 C) (Oral)  Resp 16  Ht 5\' 7"  (1.702 m)  Wt 180 lb (81.647 kg)  BMI 28.19 kg/m2  SpO2 100% Physical Exam   Nursing note and vitals reviewed. Constitutional: He is oriented to person, place, and time. He appears well-developed and well-nourished. No distress.  HENT:  Head: Normocephalic and atraumatic.  Mouth/Throat: Oropharynx is clear and moist. No oropharyngeal exudate.  Eyes: Conjunctivae and EOM are normal. Pupils are equal, round, and reactive to light.  Neck: Normal range of motion. Neck supple.  No meningismus.  Cardiovascular: Normal rate, regular rhythm, normal heart sounds and intact distal pulses.   No murmur heard. Pulmonary/Chest: Effort normal and breath sounds normal. No respiratory distress.  Abdominal: Soft. There is no tenderness. There is no rebound and no guarding.  Musculoskeletal: Normal range of motion. He exhibits no edema and no tenderness.  Neurological: He is alert and oriented to person, place, and time. No cranial nerve deficit. He exhibits normal muscle tone. Coordination normal.  No ataxia on finger to nose bilaterally. No pronator drift. 5/5 strength throughout. CN 2-12 intact. Negative Romberg. Equal grip strength. Sensation intact. Gait is normal.   Cranial nerves 2-12 intact. 5/5 strength throughout, no ataxia on finger to nose. Romberg neg. Normal gait. No nystagmus.    Skin: Skin is warm.  Psychiatric: He has a normal mood and affect. His behavior is normal.    ED Course  Procedures  DIAGNOSTIC STUDIES: Oxygen Saturation is 96% on RA, adequate by my interpretation.    COORDINATION OF CARE: 1:32 PM Discussed treatment plan with pt at bedside and pt agreed to plan.   Labs Review Labs Reviewed  CBC WITH DIFFERENTIAL - Abnormal; Notable for the following:    MCHC 36.6 (*)    Lymphocytes Relative 50 (*)    All other components within normal limits  BASIC METABOLIC PANEL - Abnormal; Notable for the following:    Glucose, Bld 282 (*)    All other components within normal limits  URINALYSIS, ROUTINE W REFLEX MICROSCOPIC - Abnormal; Notable for the  following:    Glucose, UA >1000 (*)    Ketones, ur 40 (*)    All other components within normal limits  URINE MICROSCOPIC-ADD ON - Abnormal; Notable for the following:    Squamous Epithelial / LPF FEW (*)    All other components within normal limits  CBG MONITORING, ED - Abnormal; Notable for the following:    Glucose-Capillary 306 (*)    All other components within normal limits    Imaging Review Ct Head Wo Contrast  05/15/2014   CLINICAL DATA:  Headache beginning this morning.  EXAM: CT HEAD WITHOUT CONTRAST  TECHNIQUE: Contiguous axial images were obtained from the base of the skull through the vertex without intravenous contrast.  COMPARISON:  Head CT scan 03/12/2014.  FINDINGS: The brain appears normal without hemorrhage, infarct, mass lesion, mass effect, midline shift or abnormal extra-axial fluid collection. No hydrocephalus or pneumocephalus. The calvarium is intact. Imaged paranasal sinuses and mastoid air cells are clear.  IMPRESSION: Negative exam.   Electronically Signed   By: Inge Rise M.D.   On: 05/15/2014 14:15     EKG Interpretation None      MDM   Final diagnoses:  Anxiety   patient reports anxiety and out of Xanax for the past 5 days. Denies suicidal or homicidal thoughts. States compliance with other medications.  Hyperglycemia without evidence of DKA. Patient is hypertensive.  Patient states he is not taking his Risperdal since December 2014 because it is "too strong". He last had his Xanax filled on September 15 which is a 30 day supply.  Informed of the ED cannot refill chronic medications. He'll be given a short supply of Xanax to prevent withdrawals. Return precautions discussed.   BP 133/100  Pulse 64  Temp(Src) 99.2 F (37.3 C) (Oral)  Resp 16  Ht 5\' 7"  (1.702 m)  Wt 180 lb (81.647 kg)  BMI 28.19 kg/m2  SpO2 100%   I personally performed the services described in this documentation, which was scribed in my presence. The recorded  information has been reviewed and is accurate.      Ezequiel Essex, MD 05/15/14 2109

## 2014-05-15 NOTE — ED Notes (Signed)
Pt called RN to room stating, "I need to hurry up and get out of here because I am starting to see someone sitting in the chair that is not there."  Pt reports being out of schizophrenia medication x 5 days also.  States attempted to fill xanax rx without filling schizophrenia rx and pharmacy would not fill one without the other, so he has been without both x 5 days.  Pt does not know name of schizophrenia medication.  Pt's pharmacy being contacted to obtain name.  edp notified.

## 2014-05-15 NOTE — ED Notes (Signed)
Per EMS - pt was walking down street, stopped to talk to pt then pt started c/o being anxious and being out of xanax x 5 days.  Wanted to be transferred to ED for eval.

## 2014-06-29 ENCOUNTER — Emergency Department (HOSPITAL_COMMUNITY): Payer: Medicaid Other

## 2014-06-29 ENCOUNTER — Inpatient Hospital Stay (HOSPITAL_COMMUNITY)
Admission: EM | Admit: 2014-06-29 | Discharge: 2014-07-01 | DRG: 639 | Disposition: A | Discharge status: Home / Self Care | Payer: Medicaid Other | Attending: Internal Medicine | Admitting: Internal Medicine

## 2014-06-29 ENCOUNTER — Encounter (HOSPITAL_COMMUNITY): Payer: Self-pay | Admitting: Emergency Medicine

## 2014-06-29 DIAGNOSIS — R109 Unspecified abdominal pain: Secondary | ICD-10-CM

## 2014-06-29 DIAGNOSIS — I10 Essential (primary) hypertension: Secondary | ICD-10-CM | POA: Diagnosis present

## 2014-06-29 DIAGNOSIS — Z9114 Patient's other noncompliance with medication regimen: Secondary | ICD-10-CM | POA: Diagnosis present

## 2014-06-29 DIAGNOSIS — E1110 Type 2 diabetes mellitus with ketoacidosis without coma: Secondary | ICD-10-CM | POA: Diagnosis present

## 2014-06-29 DIAGNOSIS — Z886 Allergy status to analgesic agent status: Secondary | ICD-10-CM

## 2014-06-29 DIAGNOSIS — E1310 Other specified diabetes mellitus with ketoacidosis without coma: Secondary | ICD-10-CM | POA: Diagnosis not present

## 2014-06-29 DIAGNOSIS — Z833 Family history of diabetes mellitus: Secondary | ICD-10-CM

## 2014-06-29 DIAGNOSIS — G40909 Epilepsy, unspecified, not intractable, without status epilepticus: Secondary | ICD-10-CM | POA: Diagnosis present

## 2014-06-29 DIAGNOSIS — Z825 Family history of asthma and other chronic lower respiratory diseases: Secondary | ICD-10-CM

## 2014-06-29 DIAGNOSIS — F319 Bipolar disorder, unspecified: Secondary | ICD-10-CM | POA: Diagnosis present

## 2014-06-29 DIAGNOSIS — Z9111 Patient's noncompliance with dietary regimen: Secondary | ICD-10-CM | POA: Diagnosis present

## 2014-06-29 DIAGNOSIS — M7918 Myalgia, other site: Secondary | ICD-10-CM

## 2014-06-29 DIAGNOSIS — G4730 Sleep apnea, unspecified: Secondary | ICD-10-CM | POA: Diagnosis present

## 2014-06-29 DIAGNOSIS — F17210 Nicotine dependence, cigarettes, uncomplicated: Secondary | ICD-10-CM | POA: Diagnosis present

## 2014-06-29 DIAGNOSIS — Z794 Long term (current) use of insulin: Secondary | ICD-10-CM

## 2014-06-29 DIAGNOSIS — Z79899 Other long term (current) drug therapy: Secondary | ICD-10-CM | POA: Diagnosis not present

## 2014-06-29 DIAGNOSIS — R739 Hyperglycemia, unspecified: Secondary | ICD-10-CM | POA: Diagnosis present

## 2014-06-29 DIAGNOSIS — R079 Chest pain, unspecified: Secondary | ICD-10-CM

## 2014-06-29 DIAGNOSIS — E1159 Type 2 diabetes mellitus with other circulatory complications: Secondary | ICD-10-CM

## 2014-06-29 HISTORY — DX: Type 2 diabetes mellitus with other circulatory complications: E11.59

## 2014-06-29 HISTORY — DX: Myalgia, other site: M79.18

## 2014-06-29 LAB — BLOOD GAS, VENOUS
ACID-BASE DEFICIT: 5.4 mmol/L — AB (ref 0.0–2.0)
BICARBONATE: 20 meq/L (ref 20.0–24.0)
DRAWN BY: 25788
O2 Saturation: 49 %
PCO2 VEN: 42.6 mmHg — AB (ref 45.0–50.0)
PH VEN: 7.293 (ref 7.250–7.300)
Patient temperature: 37
TCO2: 17.9 mmol/L (ref 0–100)

## 2014-06-29 LAB — CBC WITH DIFFERENTIAL/PLATELET
BASOS PCT: 1 % (ref 0–1)
Basophils Absolute: 0 10*3/uL (ref 0.0–0.1)
EOS ABS: 0 10*3/uL (ref 0.0–0.7)
Eosinophils Relative: 1 % (ref 0–5)
HCT: 40.6 % (ref 39.0–52.0)
HEMOGLOBIN: 15 g/dL (ref 13.0–17.0)
LYMPHS ABS: 2.1 10*3/uL (ref 0.7–4.0)
Lymphocytes Relative: 44 % (ref 12–46)
MCH: 32.7 pg (ref 26.0–34.0)
MCHC: 36.9 g/dL — AB (ref 30.0–36.0)
MCV: 88.5 fL (ref 78.0–100.0)
MONOS PCT: 8 % (ref 3–12)
Monocytes Absolute: 0.4 10*3/uL (ref 0.1–1.0)
NEUTROS ABS: 2.3 10*3/uL (ref 1.7–7.7)
NEUTROS PCT: 47 % (ref 43–77)
Platelets: 182 10*3/uL (ref 150–400)
RBC: 4.59 MIL/uL (ref 4.22–5.81)
RDW: 13.2 % (ref 11.5–15.5)
WBC: 4.9 10*3/uL (ref 4.0–10.5)

## 2014-06-29 LAB — COMPREHENSIVE METABOLIC PANEL
ALBUMIN: 3.4 g/dL — AB (ref 3.5–5.2)
ALK PHOS: 81 U/L (ref 39–117)
AST: 10 U/L (ref 0–37)
BILIRUBIN TOTAL: 0.3 mg/dL (ref 0.3–1.2)
BUN: 16 mg/dL (ref 6–23)
CO2: 18 mEq/L — ABNORMAL LOW (ref 19–32)
Calcium: 8.9 mg/dL (ref 8.4–10.5)
Chloride: 94 mEq/L — ABNORMAL LOW (ref 96–112)
Creatinine, Ser: 0.92 mg/dL (ref 0.50–1.35)
GFR calc Af Amer: >90 mL/min (ref >90)
GFR calc non Af Amer: >90 mL/min (ref >90)
Glucose, Bld: 479 mg/dL — ABNORMAL HIGH (ref 70–99)
Potassium: 4.7 mEq/L (ref 3.7–5.3)
SODIUM: 133 meq/L — AB (ref 137–147)
TOTAL PROTEIN: 6.3 g/dL (ref 6.0–8.3)

## 2014-06-29 LAB — CBG MONITORING, ED
Glucose-Capillary: 527 mg/dL — ABNORMAL HIGH (ref 70–99)
Glucose-Capillary: 346 mg/dL — ABNORMAL HIGH (ref 70–99)
Glucose-Capillary: 309 mg/dL — ABNORMAL HIGH (ref 70–99)

## 2014-06-29 LAB — BASIC METABOLIC PANEL
Anion gap: 10 (ref 5–15)
BUN: 11 mg/dL (ref 6–23)
CALCIUM: 7.9 mg/dL — AB (ref 8.4–10.5)
CO2: 25 mEq/L (ref 19–32)
CREATININE: 0.76 mg/dL (ref 0.50–1.35)
Chloride: 104 mEq/L (ref 96–112)
GFR calc non Af Amer: >90 mL/min (ref >90)
Glucose, Bld: 144 mg/dL — ABNORMAL HIGH (ref 70–99)
Potassium: 3.5 mEq/L — ABNORMAL LOW (ref 3.7–5.3)
Sodium: 139 mEq/L (ref 137–147)

## 2014-06-29 LAB — GLUCOSE, CAPILLARY
Glucose-Capillary: 247 mg/dL — ABNORMAL HIGH (ref 70–99)
Glucose-Capillary: 210 mg/dL — ABNORMAL HIGH (ref 70–99)
Glucose-Capillary: 169 mg/dL — ABNORMAL HIGH (ref 70–99)
Glucose-Capillary: 147 mg/dL — ABNORMAL HIGH (ref 70–99)
Glucose-Capillary: 182 mg/dL — ABNORMAL HIGH (ref 70–99)
Glucose-Capillary: 187 mg/dL — ABNORMAL HIGH (ref 70–99)
Glucose-Capillary: 200 mg/dL — ABNORMAL HIGH (ref 70–99)

## 2014-06-29 LAB — TROPONIN I

## 2014-06-29 LAB — D-DIMER, QUANTITATIVE: D-Dimer, Quant: 0.32 ug/mL-FEU (ref 0.00–0.48)

## 2014-06-29 LAB — LIPASE, BLOOD: Lipase: 80 U/L — ABNORMAL HIGH (ref 11–59)

## 2014-06-29 LAB — MAGNESIUM: MAGNESIUM: 1.9 mg/dL (ref 1.5–2.5)

## 2014-06-29 LAB — MRSA PCR SCREENING: MRSA BY PCR: NEGATIVE

## 2014-06-29 MED ORDER — SODIUM CHLORIDE 0.9 % IV SOLN: 1000.0000 mL | Freq: Once | INTRAVENOUS | Status: DC

## 2014-06-29 MED ORDER — SODIUM CHLORIDE 0.9 % IV SOLN
INTRAVENOUS | Status: DC
  Administered 2014-06-29 (×2): via INTRAVENOUS

## 2014-06-29 MED ORDER — HYDROMORPHONE HCL 1 MG/ML IJ SOLN
1.0000 mg | Freq: Once | INTRAMUSCULAR | Status: AC
  Administered 2014-06-29: 1 mg via INTRAVENOUS
  Filled 2014-06-29: qty 1

## 2014-06-29 MED ORDER — HYDROMORPHONE HCL 1 MG/ML IJ SOLN: 1.0000 mg | Freq: Once | INTRAMUSCULAR | Status: DC

## 2014-06-29 MED ORDER — SODIUM CHLORIDE 0.9 % IV SOLN
INTRAVENOUS | Status: DC
  Administered 2014-06-30: 04:00:00 via INTRAVENOUS

## 2014-06-29 MED ORDER — INSULIN ASPART 100 UNIT/ML ~~LOC~~ SOLN
0.0000 [IU] | Freq: Three times a day (TID) | SUBCUTANEOUS | Status: DC
  Administered 2014-06-29 – 2014-06-30 (×2): 3 [IU] via SUBCUTANEOUS

## 2014-06-29 MED ORDER — SODIUM CHLORIDE 0.9 % IV SOLN: 1000.0000 mL | Freq: Once | INTRAVENOUS | Status: AC

## 2014-06-29 MED ORDER — SODIUM CHLORIDE 0.9 % IV BOLUS (SEPSIS)
1000.0000 mL | Freq: Once | INTRAVENOUS | Status: AC
  Administered 2014-06-29: 1000 mL via INTRAVENOUS

## 2014-06-29 MED ORDER — INSULIN GLARGINE 100 UNIT/ML ~~LOC~~ SOLN
20.0000 [IU] | Freq: Every day | SUBCUTANEOUS | Status: DC
  Administered 2014-06-29: 20 [IU] via SUBCUTANEOUS
  Filled 2014-06-29 (×2): qty 0.2

## 2014-06-29 MED ORDER — ONDANSETRON HCL 4 MG/2ML IJ SOLN: 4.0000 mg | Freq: Four times a day (QID) | INTRAMUSCULAR | Status: DC | PRN

## 2014-06-29 MED ORDER — NICOTINE 21 MG/24HR TD PT24
21.0000 mg | MEDICATED_PATCH | Freq: Every day | TRANSDERMAL | Status: DC
  Administered 2014-06-29 – 2014-06-30 (×2): 21 mg via TRANSDERMAL
  Filled 2014-06-29 (×2): qty 1

## 2014-06-29 MED ORDER — INSULIN GLARGINE 100 UNIT/ML ~~LOC~~ SOLN: 20.0000 [IU] | Freq: Every day | SUBCUTANEOUS | Status: DC

## 2014-06-29 MED ORDER — ONDANSETRON HCL 4 MG/2ML IJ SOLN
4.0000 mg | Freq: Once | INTRAMUSCULAR | Status: AC
  Administered 2014-06-29: 4 mg via INTRAVENOUS
  Filled 2014-06-29: qty 2

## 2014-06-29 MED ORDER — SODIUM CHLORIDE 0.9 % IV SOLN
INTRAVENOUS | Status: DC
  Filled 2014-06-29: qty 2.5

## 2014-06-29 MED ORDER — INSULIN REGULAR BOLUS VIA INFUSION
0.0000 [IU] | Freq: Three times a day (TID) | INTRAVENOUS | Status: DC
  Filled 2014-06-29: qty 10

## 2014-06-29 MED ORDER — DEXTROSE-NACL 5-0.45 % IV SOLN
INTRAVENOUS | Status: DC
  Administered 2014-06-29: 15:00:00 via INTRAVENOUS

## 2014-06-29 MED ORDER — DIVALPROEX SODIUM 250 MG PO DR TAB
1500.0000 mg | DELAYED_RELEASE_TABLET | Freq: Every day | ORAL | Status: DC
  Administered 2014-06-29 – 2014-06-30 (×2): 1500 mg via ORAL
  Filled 2014-06-29 (×2): qty 6

## 2014-06-29 MED ORDER — ONDANSETRON HCL 4 MG/2ML IJ SOLN: 4.0000 mg | Freq: Three times a day (TID) | INTRAMUSCULAR | Status: AC | PRN

## 2014-06-29 MED ORDER — SODIUM CHLORIDE 0.9 % IJ SOLN
3.0000 mL | Freq: Two times a day (BID) | INTRAMUSCULAR | Status: DC
  Administered 2014-06-30 (×2): 3 mL via INTRAVENOUS

## 2014-06-29 MED ORDER — DEXTROSE-NACL 5-0.45 % IV SOLN: INTRAVENOUS | Status: DC

## 2014-06-29 MED ORDER — FENTANYL CITRATE 0.05 MG/ML IJ SOLN
100.0000 ug | Freq: Once | INTRAMUSCULAR | Status: AC
  Administered 2014-06-29: 100 ug via INTRAVENOUS
  Filled 2014-06-29: qty 2

## 2014-06-29 MED ORDER — HYDROMORPHONE HCL 2 MG/ML IJ SOLN
2.0000 mg | Freq: Once | INTRAMUSCULAR | Status: AC
  Administered 2014-06-29: 2 mg via INTRAVENOUS
  Filled 2014-06-29: qty 1

## 2014-06-29 MED ORDER — DEXTROSE 50 % IV SOLN: 25.0000 mL | INTRAVENOUS | Status: DC | PRN

## 2014-06-29 MED ORDER — SODIUM CHLORIDE 0.9 % IV SOLN
1000.0000 mL | INTRAVENOUS | Status: DC
  Administered 2014-06-29: 1000 mL via INTRAVENOUS

## 2014-06-29 MED ORDER — ONDANSETRON HCL 4 MG PO TABS
4.0000 mg | ORAL_TABLET | Freq: Four times a day (QID) | ORAL | Status: DC | PRN
  Administered 2014-06-30: 4 mg via ORAL
  Filled 2014-06-29: qty 1

## 2014-06-29 MED ORDER — SODIUM CHLORIDE 0.9 % IV SOLN
INTRAVENOUS | Status: DC
  Administered 2014-06-29: 2.9 [IU]/h via INTRAVENOUS
  Filled 2014-06-29: qty 2.5

## 2014-06-29 MED ORDER — OXYCODONE HCL 5 MG PO TABS
5.0000 mg | ORAL_TABLET | ORAL | Status: DC | PRN
  Administered 2014-06-29 (×2): 5 mg via ORAL
  Filled 2014-06-29 (×2): qty 1

## 2014-06-29 MED ORDER — IOHEXOL 300 MG/ML  SOLN
100.0000 mL | Freq: Once | INTRAMUSCULAR | Status: AC | PRN
  Administered 2014-06-29: 100 mL via INTRAVENOUS

## 2014-06-29 MED ORDER — ATENOLOL 25 MG PO TABS
100.0000 mg | ORAL_TABLET | Freq: Every day | ORAL | Status: DC
  Administered 2014-06-30: 100 mg via ORAL
  Filled 2014-06-29: qty 4

## 2014-06-29 MED ORDER — LEVETIRACETAM 500 MG PO TABS
1500.0000 mg | ORAL_TABLET | Freq: Every day | ORAL | Status: DC
  Administered 2014-06-29 – 2014-06-30 (×2): 1500 mg via ORAL
  Filled 2014-06-29 (×2): qty 3

## 2014-06-29 MED ORDER — IOHEXOL 300 MG/ML  SOLN
50.0000 mL | Freq: Once | INTRAMUSCULAR | Status: AC | PRN
  Administered 2014-06-29: 50 mL via ORAL

## 2014-06-29 MED ORDER — SODIUM CHLORIDE 0.9 % IV SOLN: 1000.0000 mL | INTRAVENOUS | Status: DC

## 2014-06-29 MED ORDER — HEPARIN SODIUM (PORCINE) 5000 UNIT/ML IJ SOLN
5000.0000 [IU] | Freq: Three times a day (TID) | INTRAMUSCULAR | Status: DC
  Administered 2014-06-29 – 2014-06-30 (×3): 5000 [IU] via SUBCUTANEOUS
  Filled 2014-06-29 (×3): qty 1

## 2014-06-29 MED ORDER — OXYCODONE HCL 5 MG PO TABS
5.0000 mg | ORAL_TABLET | ORAL | Status: DC | PRN
  Administered 2014-06-29 – 2014-07-01 (×8): 10 mg via ORAL
  Filled 2014-06-29 (×8): qty 2

## 2014-06-29 MED ORDER — INSULIN ASPART 100 UNIT/ML ~~LOC~~ SOLN: 0.0000 [IU] | Freq: Every day | SUBCUTANEOUS | Status: DC

## 2014-06-29 NOTE — H&P (Signed)
[facility] History and Physical  Melvin Taylor QBV:[id number] DOB: 04-Dec-1965 DOA: 06/29/2014  Referring physician: Dr. Winfred Leeds PCP: Rosita Fire, MD   Chief Complaint: back pain, nausea  HPI: Melvin Taylor is a 48 y.o. male  With history of diabetes on insulin and oral hypoglycemic (metformin). Who presented initially complaining of back discomfort as well as left chest discomfort and nausea. He reports that he takes his medications like he is supposed to but he was not compliant with his diabetic diet. He reports that he was eating everything he could Thanksgiving. He denies any fevers or chills or any sick contacts. Has had some cough productive of clear phlegm.  While in the ED patient was found to have a CO2 of 18 a blood glucose of 479 and urinalysis showing glycosuria as well as ketonuria.  Review of Systems:  Constitutional:  No weight loss, night sweats, Fevers, chills, fatigue.  HEENT:  No headaches, Difficulty swallowing,Tooth/dental problems,Sore throat,  No sneezing, itching, ear ache, nasal congestion, post nasal drip,  Cardio-vascular:  No chest pain, Orthopnea, PND, swelling in lower extremities, anasarca, dizziness, palpitations  GI:  No heartburn, indigestion, abdominal pain, nausea, vomiting, diarrhea, change in bowel habits, loss of appetite  Resp:  No shortness of breath with exertion or at rest. No excess mucus, no productive cough, No non-productive cough, No coughing up of blood.No change in color of mucus.No wheezing.No chest wall deformity  Skin:  no rash or lesions.  GU:  no dysuria, change in color of urine, no urgency or frequency. No flank pain.  Musculoskeletal:  No joint pain or swelling. No decreased range of motion. + back pain.  Psych:  No change in mood or affect. No depression or anxiety. No memory loss.   Past Medical History  Diagnosis Date  . Seizures   . Anxiety   . Depression   . Diabetes mellitus   . HTN  (hypertension)   . Sleep apnea   . Bipolar 1 disorder   . Insomnia   . Headache(784.0)   . Schizophrenia    Past Surgical History  Procedure Laterality Date  . Mandible fracture surgery    . Knee surgery    . Tonsillectomy     Social History:  reports that he has been smoking Cigarettes.  He has a 30 pack-year smoking history. He has never used smokeless tobacco. He reports that he does not drink alcohol or use illicit drugs.  Allergies  Allergen Reactions  . Aspirin Swelling    Family History  Problem Relation Age of Onset  . Arthritis    . Diabetes    . Asthma       Prior to Admission medications   Medication Sig Start Date End Date Taking? Authorizing Provider  amLODipine (NORVASC) 10 MG tablet Take 10 mg by mouth daily.    Yes Historical Provider, MD  atenolol (TENORMIN) 100 MG tablet Take 100 mg by mouth daily.     Yes Historical Provider, MD  divalproex (DEPAKOTE) 500 MG EC tablet Take 1,500 mg by mouth at bedtime.    Yes Historical Provider, MD  hydrochlorothiazide (HYDRODIURIL) 25 MG tablet Take 25 mg by mouth daily.   Yes Historical Provider, MD  Insulin Glargine (LANTUS SOLOSTAR) 100 UNIT/ML Solostar Pen Inject 20 Units into the skin daily at 10 pm.   Yes Historical Provider, MD  levETIRAcetam (KEPPRA) 500 MG tablet Take 1,500 mg by mouth at bedtime.    Yes Historical Provider, MD  metFORMIN (GLUCOPHAGE) 1000  MG tablet Take 1,000 mg by mouth 2 (two) times daily.     Yes Historical Provider, MD  ALPRAZolam Duanne Moron) 1 MG tablet Take 1 tablet (1 mg total) by mouth 3 (three) times daily as needed for anxiety. Patient not taking: Reported on 06/29/2014 05/15/14   Ezequiel Essex, MD  cyclobenzaprine (FLEXERIL) 10 MG tablet Take 1 tablet (10 mg total) by mouth 3 (three) times daily as needed for muscle spasms (and back pain). Patient not taking: Reported on 06/29/2014 05/06/14   Janice Norrie, MD   Physical Exam: Filed Vitals:   06/29/14 1145 06/29/14 1200 06/29/14 1215  06/29/14 1252  BP:  112/79    Pulse: 68 70    Temp:    98 F (36.7 C)  TempSrc:    Oral  Resp: 30 20    Height:   5\' 7"  (1.702 m)   Weight:   71.5 kg (157 lb 10.1 oz)   SpO2: 100% 98%      Wt Readings from Last 3 Encounters:  06/29/14 71.5 kg (157 lb 10.1 oz)  05/15/14 81.647 kg (180 lb)  05/06/14 86.183 kg (190 lb)    General:  Appears calm and comfortable Eyes: PERRL, normal lids, irises & conjunctiva ENT: grossly normal hearing, lips & tongue, dry mucous membranes Neck: no LAD, masses or thyromegaly Cardiovascular: RRR, no m/r/g. No LE edema. Telemetry: SR, no arrhythmias  Respiratory: CTA bilaterally, no w/r/r. Normal respiratory effort. Abdomen: soft, nt, nd Skin: no rash or induration seen on limited exam Musculoskeletal: grossly normal tone BUE/BLE Psychiatric: grossly normal mood and affect, speech fluent and appropriate Neurologic: grossly non-focal.          Labs on Admission:  Basic Metabolic Panel:  Recent Labs Lab 06/29/14 0817  NA 133*  K 4.7  CL 94*  CO2 18*  GLUCOSE 479*  BUN 16  CREATININE 0.92  CALCIUM 8.9   Liver Function Tests:  Recent Labs Lab 06/29/14 0817  AST 10  ALT <5  ALKPHOS 81  BILITOT 0.3  PROT 6.3  ALBUMIN 3.4*    Recent Labs Lab 06/29/14 0817  LIPASE 80*   No results for input(s): AMMONIA in the last 168 hours. CBC:  Recent Labs Lab 06/29/14 0817  WBC 4.9  NEUTROABS 2.3  HGB 15.0  HCT 40.6  MCV 88.5  PLT 182   Cardiac Enzymes:  Recent Labs Lab 06/29/14 0817  TROPONINI <0.30    BNP (last 3 results) No results for input(s): PROBNP in the last 8760 hours. CBG:  Recent Labs Lab 06/29/14 0804 06/29/14 1001 06/29/14 1121 06/29/14 1229  GLUCAP 527* 346* 309* 247*    Radiological Exams on Admission: Ct Abdomen Pelvis W Contrast  06/29/2014   CLINICAL DATA:  mid back pain that radiates into chest. Per patient started this morning when he got out of bed-states "brought me to my knees."Patient  reports shortness of breath and nausea. Per patient "Every time he breaths in it hurts." Left lower abd pain  EXAM: CT ABDOMEN AND PELVIS WITH CONTRAST  TECHNIQUE: Multidetector CT imaging of the abdomen and pelvis was performed using the standard protocol following bolus administration of intravenous contrast.  CONTRAST:  67mL OMNIPAQUE IOHEXOL 300 MG/ML SOLN, 148mL OMNIPAQUE IOHEXOL 300 MG/ML SOLN  COMPARISON:  02/20/2012  FINDINGS: Visualized lung bases clear. Unremarkable liver, nondilated gallbladder, spleen, adrenal glands, kidneys, pancreas. No hydronephrosis. Atheromatous nondilated abdominal aorta. Portal vein patent. Stomach, small bowel, and colon are nondilated. Moderate colonic and rectal fecal material.  Urinary bladder physiologically distended. Mild prostatic enlargement. Bilateral pelvic phleboliths. No ascites. No free air. No adenopathy localized.Regional bones unremarkable.  IMPRESSION: 1. No acute abdominal process. 2. Moderate colonic and rectal fecal material without evidence of obstruction or focal inflammatory change.   Electronically Signed   By: Arne Cleveland M.D.   On: 06/29/2014 10:25   Dg Chest Port 1 View  06/29/2014   CLINICAL DATA:  Mid back pain radiating to chest, nausea, shortness of breath  EXAM: PORTABLE CHEST - 1 VIEW  COMPARISON:  09/07/2013  FINDINGS: Lungs are clear.  No pleural effusion or pneumothorax.  The heart is normal in size.  IMPRESSION: No evidence of acute cardiopulmonary disease.   Electronically Signed   By: Julian Hy M.D.   On: 06/29/2014 08:57    EKG: Independently reviewed. Sinus rhythm with no ST elevations or depressions  Assessment/Plan Active Problems:   DKA (diabetic ketoacidoses)/ DM (diabetes mellitus) type 2, uncontrolled, with ketoacidosis - We'll place on insulin drip. - Place order set for DKA, please review for details    Musculoskeletal pain - Patient is allergic to aspirin, will treat with OxyIR every 6 hours as needed  for pain  Code Status: Full DVT Prophylaxis: Heparin Family Communication: No family at bedside Disposition Plan: Stepdown  Time spent: > 55 minutes  Velvet Bathe [facility] Pager [phone]

## 2014-06-29 NOTE — ED Notes (Signed)
Patient brought in via EMS from home. Alert and oriented. Airway patent. Patient c/o mid back pain that radiates into chest. Per patient started this morning when he got out of bed-states "brought me to my knees."Patient reports shortness of breath and nausea. Per patient "Every time he breaths in it hurts." Per EMS EKG NSR but blood glucose over 600.

## 2014-06-29 NOTE — ED Notes (Signed)
CRITICAL VALUE ALERT  Critical value received:  Venous blood gas so2 79 and p02<readable  Date of notification:  06/29/14  Time of notification:  0950  Critical value read back:Yes.    Nurse who received alert:  rminter  MD notified (1st page):  J.Idol  Time of first page:  [phone]  MD notified (2nd page):  Time of second page:  Responding MD:  J.Idol  Time MD responded:  [phone]

## 2014-06-29 NOTE — ED Provider Notes (Signed)
Complains of thoracic back pain and pleuritic anterior chest pain onset upon awakening 6 AM today denies cough admits to mild shortness of breath. No other associated symptoms. Patient admits to noncompliance with diet the past several days. He denies noncompliance with medications on exam patient is alert Glasgow Coma Score 15 nontoxic appearing HEENT exam mucous membranes dry poor dentition neck supple no bruit lungs clear breath sounds chest is tender anteriorly. He has pain at thoracic back upon sitting up from a supine position abdomen normal active bowel sounds nondistended minimal diffuse tenderness. Genitalia normal male extremities without edema Chest x-ray viewed by me Patient felt to be in mild diabetic ketoacidosis with anion gap of [place], MD 06/29/14 1510

## 2014-06-29 NOTE — ED Provider Notes (Signed)
CSN: [id number]     Arrival date & time 06/29/14  0800 History   First MD Initiated Contact with Patient 06/29/14 [phone]     Chief Complaint  Patient presents with  . Back Pain  . Hyperglycemia     (Consider location/radiation/quality/duration/timing/severity/associated sxs/prior Treatment) The history is provided by the patient.   JONHATAN HEARTY is a 48 y.o. male with a history significant for diabetes and seizure disorder presenting with left-sided back pain which radiates into his mid chest and woke him around 6 AM.  He was attempting to get out of bed when the pain "brought him to his knees".  He also endorses nausea without emesis, denies diaphoresis, palpitations headache, lightheadedness.  He has had no medications prior to arrival.  He does endorse generalized abdominal pain as well, his blood sugars have not been well controlled coming out of this holiday weekend, blaming today's elevated blood sugar on Thanksgiving dinner.  He does endorse increased urinary frequency along with increased thirst.  Has had no diarrhea, last bowel movement was yesterday and normal.  He denies prior history of cardiac illness.  He is a one pack per day smoker, denies current substance abuse, drinks occasional beer last intake was 4-5 days ago.  He's had no medications for today's pain prior to arrival.   Past Medical History  Diagnosis Date  . Seizures   . Anxiety   . Depression   . Diabetes mellitus   . HTN (hypertension)   . Sleep apnea   . Bipolar 1 disorder   . Insomnia   . Headache(784.0)   . Schizophrenia    Past Surgical History  Procedure Laterality Date  . Mandible fracture surgery    . Knee surgery    . Tonsillectomy     Family History  Problem Relation Age of Onset  . Arthritis    . Diabetes    . Asthma     History  Substance Use Topics  . Smoking status: Current Every Day Smoker -- 1.00 packs/day for 30 years    Types: Cigarettes  . Smokeless tobacco: Never Used  .  Alcohol Use: No    Review of Systems  Constitutional: Negative.  Negative for fever.  HENT: Negative.  Negative for congestion and sore throat.   Eyes: Negative.   Respiratory: Negative for chest tightness and shortness of breath.   Cardiovascular: Positive for chest pain.  Gastrointestinal: Positive for nausea. Negative for vomiting and abdominal pain.  Endocrine: Positive for polydipsia and polyuria.  Genitourinary: Positive for frequency.  Musculoskeletal: Positive for back pain. Negative for joint swelling, arthralgias and neck pain.  Skin: Negative.  Negative for rash and wound.  Neurological: Negative for dizziness, weakness, light-headedness, numbness and headaches.  Psychiatric/Behavioral: Negative.       Allergies  Aspirin  Home Medications   Prior to Admission medications   Medication Sig Start Date End Date Taking? Authorizing Provider  amLODipine (NORVASC) 10 MG tablet Take 10 mg by mouth daily.    Yes Historical Provider, MD  atenolol (TENORMIN) 100 MG tablet Take 100 mg by mouth daily.     Yes Historical Provider, MD  divalproex (DEPAKOTE) 500 MG EC tablet Take 1,500 mg by mouth at bedtime.    Yes Historical Provider, MD  hydrochlorothiazide (HYDRODIURIL) 25 MG tablet Take 25 mg by mouth daily.   Yes Historical Provider, MD  Insulin Glargine (LANTUS SOLOSTAR) 100 UNIT/ML Solostar Pen Inject 20 Units into the skin daily at 10 pm.  Yes Historical Provider, MD  levETIRAcetam (KEPPRA) 500 MG tablet Take 1,500 mg by mouth at bedtime.    Yes Historical Provider, MD  metFORMIN (GLUCOPHAGE) 1000 MG tablet Take 1,000 mg by mouth 2 (two) times daily.     Yes Historical Provider, MD  ALPRAZolam Duanne Moron) 1 MG tablet Take 1 tablet (1 mg total) by mouth 3 (three) times daily as needed for anxiety. Patient not taking: Reported on 06/29/2014 05/15/14   Ezequiel Essex, MD  cyclobenzaprine (FLEXERIL) 10 MG tablet Take 1 tablet (10 mg total) by mouth 3 (three) times daily as needed  for muscle spasms (and back pain). Patient not taking: Reported on 06/29/2014 05/06/14   Janice Norrie, MD   BP 119/107 mmHg  Pulse 69  Temp(Src) 98.1 F (36.7 C) (Oral)  Resp 10  Ht 5\' 7"  (1.702 m)  Wt 170 lb (77.111 kg)  BMI 26.62 kg/m2  SpO2 98% Physical Exam  Constitutional: He appears well-developed and well-nourished.  HENT:  Head: Normocephalic and atraumatic.  Mouth/Throat: Mucous membranes are dry.  Eyes: Conjunctivae are normal.  Neck: Normal range of motion.  Cardiovascular: Normal rate, regular rhythm, normal heart sounds and intact distal pulses.   Pulmonary/Chest: Effort normal and breath sounds normal. He has no wheezes.  Abdominal: Soft. Bowel sounds are normal. There is tenderness in the right upper quadrant, right lower quadrant and epigastric area. There is no CVA tenderness and negative Murphy's sign.  Musculoskeletal: Normal range of motion. He exhibits no edema.       Thoracic back: He exhibits tenderness.       Back:  Neurological: He is alert.  Skin: Skin is warm and dry.  Psychiatric: He has a normal mood and affect.  Nursing note and vitals reviewed.   ED Course  Procedures (including critical care time) Labs Review Labs Reviewed  CBC WITH DIFFERENTIAL - Abnormal; Notable for the following:    MCHC 36.9 (*)    All other components within normal limits  COMPREHENSIVE METABOLIC PANEL - Abnormal; Notable for the following:    Sodium 133 (*)    Chloride 94 (*)    CO2 18 (*)    Glucose, Bld 479 (*)    Albumin 3.4 (*)    All other components within normal limits  LIPASE, BLOOD - Abnormal; Notable for the following:    Lipase 80 (*)    All other components within normal limits  BLOOD GAS, VENOUS - Abnormal; Notable for the following:    pCO2, Ven 42.6 (*)    Acid-base deficit 5.4 (*)    All other components within normal limits  CBG MONITORING, ED - Abnormal; Notable for the following:    Glucose-Capillary 527 (*)    All other components within  normal limits  CBG MONITORING, ED - Abnormal; Notable for the following:    Glucose-Capillary 346 (*)    All other components within normal limits  CBG MONITORING, ED - Abnormal; Notable for the following:    Glucose-Capillary 309 (*)    All other components within normal limits  TROPONIN I  D-DIMER, QUANTITATIVE    Imaging Review Ct Abdomen Pelvis W Contrast  06/29/2014   CLINICAL DATA:  mid back pain that radiates into chest. Per patient started this morning when he got out of bed-states "brought me to my knees."Patient reports shortness of breath and nausea. Per patient "Every time he breaths in it hurts." Left lower abd pain  EXAM: CT ABDOMEN AND PELVIS WITH CONTRAST  TECHNIQUE: Multidetector  CT imaging of the abdomen and pelvis was performed using the standard protocol following bolus administration of intravenous contrast.  CONTRAST:  60mL OMNIPAQUE IOHEXOL 300 MG/ML SOLN, 15mL OMNIPAQUE IOHEXOL 300 MG/ML SOLN  COMPARISON:  02/20/2012  FINDINGS: Visualized lung bases clear. Unremarkable liver, nondilated gallbladder, spleen, adrenal glands, kidneys, pancreas. No hydronephrosis. Atheromatous nondilated abdominal aorta. Portal vein patent. Stomach, small bowel, and colon are nondilated. Moderate colonic and rectal fecal material. Urinary bladder physiologically distended. Mild prostatic enlargement. Bilateral pelvic phleboliths. No ascites. No free air. No adenopathy localized.Regional bones unremarkable.  IMPRESSION: 1. No acute abdominal process. 2. Moderate colonic and rectal fecal material without evidence of obstruction or focal inflammatory change.   Electronically Signed   By: Arne Cleveland M.D.   On: 06/29/2014 10:25   Dg Chest Port 1 View  06/29/2014   CLINICAL DATA:  Mid back pain radiating to chest, nausea, shortness of breath  EXAM: PORTABLE CHEST - 1 VIEW  COMPARISON:  09/07/2013  FINDINGS: Lungs are clear.  No pleural effusion or pneumothorax.  The heart is normal in size.   IMPRESSION: No evidence of acute cardiopulmonary disease.   Electronically Signed   By: Julian Hy M.D.   On: 06/29/2014 08:57     EKG Interpretation   Date/Time:  Sunday June 29 2014 08:08:08 EST Ventricular Rate:  76 PR Interval:  172 QRS Duration: 77 QT Interval:  371 QTC Calculation: 417 R Axis:   -57 Text Interpretation:  Sinus rhythm Left axis deviation Probable  anteroseptal infarct, old No significant change since last tracing  Confirmed by Winfred Leeds  MD, SAM [phone]) on 06/29/2014 8:15:47 AM      MDM   Final diagnoses:  Chest pain  Abdominal pain  Diabetic ketoacidosis without coma associated with type 2 diabetes mellitus  Hyperglycemia    Patients labs and/or radiological studies were viewed and considered during the medical decision making and disposition process. Pt with dka,  Mild elevated lipase of unclear significance, persistent abdominal pain.  No history of pancreatitis.  Spoke with Dr. Wendee Beavers who agrees with admission.  Temp orders placed.    Evalee Jefferson, PA-C 06/29/14 1141  Orlie Dakin, MD 06/29/14 1511

## 2014-06-29 NOTE — Progress Notes (Signed)
Dr. Wendee Beavers notified of 1415 BMET that recently result. Gap down to 10. MD stated he would review current orders and begin to transition to SQ insulin. Will continue to monitor.

## 2014-06-30 LAB — BASIC METABOLIC PANEL
Anion gap: 10 (ref 5–15)
BUN: 10 mg/dL (ref 6–23)
CALCIUM: 7.6 mg/dL — AB (ref 8.4–10.5)
CO2: 25 mEq/L (ref 19–32)
CREATININE: 0.78 mg/dL (ref 0.50–1.35)
Chloride: 104 mEq/L (ref 96–112)
Glucose, Bld: 194 mg/dL — ABNORMAL HIGH (ref 70–99)
POTASSIUM: 4.1 meq/L (ref 3.7–5.3)
Sodium: 139 mEq/L (ref 137–147)

## 2014-06-30 LAB — GLUCOSE, CAPILLARY
GLUCOSE-CAPILLARY: 180 mg/dL — AB (ref 70–99)
GLUCOSE-CAPILLARY: 183 mg/dL — AB (ref 70–99)
Glucose-Capillary: 202 mg/dL — ABNORMAL HIGH (ref 70–99)
Glucose-Capillary: 212 mg/dL — ABNORMAL HIGH (ref 70–99)

## 2014-06-30 LAB — CBC
HEMATOCRIT: 35.7 % — AB (ref 39.0–52.0)
Hemoglobin: 12.9 g/dL — ABNORMAL LOW (ref 13.0–17.0)
MCH: 32.3 pg (ref 26.0–34.0)
MCHC: 36.1 g/dL — AB (ref 30.0–36.0)
MCV: 89.3 fL (ref 78.0–100.0)
Platelets: 181 10*3/uL (ref 150–400)
RBC: 4 MIL/uL — ABNORMAL LOW (ref 4.22–5.81)
RDW: 13.5 % (ref 11.5–15.5)
WBC: 5.8 10*3/uL (ref 4.0–10.5)

## 2014-06-30 LAB — TSH: TSH: 0.238 u[IU]/mL — ABNORMAL LOW (ref 0.350–4.500)

## 2014-06-30 LAB — HEMOGLOBIN A1C
Hgb A1c MFr Bld: 15.2 % — ABNORMAL HIGH (ref <5.7)
Mean Plasma Glucose: 390 mg/dL — ABNORMAL HIGH (ref <117)

## 2014-06-30 LAB — T4, FREE: Free T4: 1.03 ng/dL (ref 0.80–1.80)

## 2014-06-30 MED ORDER — INSULIN ASPART 100 UNIT/ML ~~LOC~~ SOLN
0.0000 [IU] | SUBCUTANEOUS | Status: DC
  Administered 2014-06-30 (×2): 3 [IU] via SUBCUTANEOUS

## 2014-06-30 MED ORDER — INSULIN ASPART 100 UNIT/ML ~~LOC~~ SOLN
0.0000 [IU] | Freq: Three times a day (TID) | SUBCUTANEOUS | Status: DC
  Administered 2014-06-30 – 2014-07-01 (×2): 2 [IU] via SUBCUTANEOUS

## 2014-06-30 MED ORDER — INSULIN ASPART 100 UNIT/ML ~~LOC~~ SOLN
5.0000 [IU] | Freq: Three times a day (TID) | SUBCUTANEOUS | Status: DC
  Administered 2014-06-30 – 2014-07-01 (×2): 5 [IU] via SUBCUTANEOUS

## 2014-06-30 MED ORDER — INSULIN GLARGINE 100 UNIT/ML ~~LOC~~ SOLN
30.0000 [IU] | Freq: Every day | SUBCUTANEOUS | Status: DC
  Administered 2014-06-30: 30 [IU] via SUBCUTANEOUS
  Filled 2014-06-30: qty 0.3

## 2014-06-30 NOTE — Care Management Note (Addendum)
    Page 1 of 1   07/01/2014     10:33:01 AM CARE MANAGEMENT NOTE 07/01/2014  Patient:  Melvin Taylor, Melvin Taylor   Account Number:  [id number]  Date Initiated:  06/30/2014  Documentation initiated by:  Jolene Provost  Subjective/Objective Assessment:   Pt is from home with self care. Pt has CAP aid that comes 5 days a week. Has no DME's at home but feels he would benefit from a cane.     Action/Plan:   Pt plans to discharge home tomorrow with self care. Emma of [facility], per pts choice, notified of DME order, will obtain pt's info from chart and deliver cane to room before discharge.  No further CM needs identified at this time.   Anticipated DC Date:  07/01/2014   Anticipated DC Plan:  [place]  CM consult      PAC Choice  DURABLE MEDICAL EQUIPMENT   Choice offered to / List presented to:  C-1 Patient   DME arranged  [facility]      DME agency  [place].        Status of service:  Completed, signed off Medicare Important Message given?   (If response is "NO", the following Medicare IM given date fields will be blank) Date Medicare IM given:   Medicare IM given by:   Date Additional Medicare IM given:   Additional Medicare IM given by:    Discharge Disposition:  HOME/SELF CARE  Per UR Regulation:    If discussed at Long Length of Stay Meetings, dates discussed:    Comments:  07/01/2014 [place], RN, MSN, PCCN Pt discharged home with self care. Pt left before cane could be delivered. [facility] will deliver cane to pt's home.  06/30/2014 [place], RN, MSN, [facility]

## 2014-06-30 NOTE — Care Management Utilization Note (Signed)
UR review complete.  

## 2014-06-30 NOTE — Progress Notes (Signed)
  PT IS VERY AGITATED AND TEARFUL. HE WANTS TO BE DISCHARGED HOME. HE IS ALSO UPSET THAT HE HAS LOST OVER 60 LBS THIS YEAR. HE IS HAVING PROBLEMS W/ HIS HH AIDE NOT SHOWING UP BUT 2 TO 3 DAYS A WEEK INSTEAD OF 5 DAYS A WEAK AS SCHEDULED.PT ALLOWED TO VENT. MUCH EMOTIONAL SUPPORT GIVEN. PT NOW SLIGHTLY CALMER.

## 2014-06-30 NOTE — Consult Note (Signed)
CHRYSTIAN CUPPLES MRN: [id number] DOB/AGE: 1965-10-14 48 y.o. Primary Care Physician:FANTA,TESFAYE, MD Admit date: 06/29/2014 Chief Complaint:  DKA HPI:  Mr. Armendariz is  A 48 yr old male with longstanding hx of uncontrolled type 2 DM. He is non compliant to clinic follow ups and gets hospitalized with DKA . He did not keep his out patient appointments with Dr. Dorris Fetch since February 2015. He was supposed to be on basal insulin , invokamet BID and strict f/u every 3 months.Marland Kitchen He presented to ER  complaining of back discomfort, abdominal pain, nausea and vomiting. He was found to have BG 476 along with Hc03 of 18 , admitted with anion wide gap ( 21) DKA.  he admits that he overindulged during Thanksgiving dinner. He reports that he was eating everything he could at Thanksgiving. He denies any fevers or chills or any sick contacts.   Past Medical History  Diagnosis Date  . Seizures   . Anxiety   . Depression   . Diabetes mellitus   . HTN (hypertension)   . Sleep apnea   . Bipolar 1 disorder   . Insomnia   . Headache(784.0)   . Schizophrenia     Past Surgical History  Procedure Laterality Date  . Mandible fracture surgery    . Knee surgery    . Tonsillectomy       Family History  Problem Relation Age of Onset  . Arthritis    . Diabetes    . Asthma      Social History:  reports that he has been smoking Cigarettes.  He has a 30 pack-year smoking history. He has never used smokeless tobacco. He reports that he does not drink alcohol or use illicit drugs.  Allergies:  Allergies  Allergen Reactions  . Aspirin Swelling    Medications Prior to Admission  Medication Sig Dispense Refill  . amLODipine (NORVASC) 10 MG tablet Take 10 mg by mouth daily.     Marland Kitchen atenolol (TENORMIN) 100 MG tablet Take 100 mg by mouth daily.      . divalproex (DEPAKOTE) 500 MG EC tablet Take 1,500 mg by mouth at bedtime.     . hydrochlorothiazide (HYDRODIURIL) 25 MG tablet Take 25 mg by mouth daily.    .  Insulin Glargine (LANTUS SOLOSTAR) 100 UNIT/ML Solostar Pen Inject 20 Units into the skin daily at 10 pm.    . levETIRAcetam (KEPPRA) 500 MG tablet Take 1,500 mg by mouth at bedtime.     . metFORMIN (GLUCOPHAGE) 1000 MG tablet Take 1,000 mg by mouth 2 (two) times daily.      Marland Kitchen ALPRAZolam (XANAX) 1 MG tablet Take 1 tablet (1 mg total) by mouth 3 (three) times daily as needed for anxiety. (Patient not taking: Reported on 06/29/2014) 10 tablet 0  . cyclobenzaprine (FLEXERIL) 10 MG tablet Take 1 tablet (10 mg total) by mouth 3 (three) times daily as needed for muscle spasms (and back pain). (Patient not taking: Reported on 06/29/2014) 30 tablet 0       NGE:XBMWU from the symptoms mentioned above,there are no other symptoms referable to all systems reviewed.  Physical Exam: Blood pressure 118/85, pulse 64, temperature 98.2 F (36.8 C), temperature source Oral, resp. rate 17, height 5\' 7"  (1.702 m), weight 71.5 kg (157 lb 10.1 oz), SpO2 100 %.   General: no acute distress, treatening to leave hospital , because he feels 'fine now'.  ENT: no pallor, moist MM. Neck: no LAD, masses or thyromegaly, No JVD Cardiovascular: RRR,  no m/r/g. No LE edema.  Respiratory: CTA bilaterally, no w/r/r. Normal respiratory effort. Abdomen: soft, nt, nd Skin: no rash or hyperemia. Musculoskeletal: grossly normal tone BUE/BLE Psychiatric: non compliant and reluctant affect.  Neurologic: no gross deficit.      Recent Labs  06/29/14 0817 06/30/14 0528  WBC 4.9 5.8  NEUTROABS 2.3  --   HGB 15.0 12.9*  HCT 40.6 35.7*  MCV 88.5 89.3  PLT 182 181    Recent Labs  06/29/14 0817 06/29/14 1415 06/30/14 0528  NA 133* 139 139  K 4.7 3.5* 4.1  CL 94* 104 104  CO2 18* 25 25  GLUCOSE 479* 144* 194*  BUN 16 11 10   CREATININE 0.92 0.76 0.78  CALCIUM 8.9 7.9* 7.6*  MG 1.9  --   --     Recent Labs  06/29/14 0817  AST 10  ALT <5  ALKPHOS 81  BILITOT 0.3  PROT 6.3  ALBUMIN 3.4*   Recent  Results (from the past 240 hour(s))  MRSA PCR Screening     Status: None   Collection Time: 06/29/14 12:35 PM  Result Value Ref Range Status   MRSA by PCR NEGATIVE NEGATIVE Final    Comment:        The GeneXpert MRSA Assay (FDA approved for NASAL specimens only), is one component of a comprehensive MRSA colonization surveillance program. It is not intended to diagnose MRSA infection nor to guide or monitor treatment for MRSA infections.     Ct Abdomen Pelvis W Contrast  06/29/2014   CLINICAL DATA:  mid back pain that radiates into chest. Per patient started this morning when he got out of bed-states "brought me to my knees."Patient reports shortness of breath and nausea. Per patient "Every time he breaths in it hurts." Left lower abd pain  EXAM: CT ABDOMEN AND PELVIS WITH CONTRAST  TECHNIQUE: Multidetector CT imaging of the abdomen and pelvis was performed using the standard protocol following bolus administration of intravenous contrast.  CONTRAST:  63mL OMNIPAQUE IOHEXOL 300 MG/ML SOLN, 170mL OMNIPAQUE IOHEXOL 300 MG/ML SOLN  COMPARISON:  02/20/2012  FINDINGS: Visualized lung bases clear. Unremarkable liver, nondilated gallbladder, spleen, adrenal glands, kidneys, pancreas. No hydronephrosis. Atheromatous nondilated abdominal aorta. Portal vein patent. Stomach, small bowel, and colon are nondilated. Moderate colonic and rectal fecal material. Urinary bladder physiologically distended. Mild prostatic enlargement. Bilateral pelvic phleboliths. No ascites. No free air. No adenopathy localized.Regional bones unremarkable.  IMPRESSION: 1. No acute abdominal process. 2. Moderate colonic and rectal fecal material without evidence of obstruction or focal inflammatory change.   Electronically Signed   By: Arne Cleveland M.D.   On: 06/29/2014 10:25   Dg Chest Port 1 View  06/29/2014   CLINICAL DATA:  Mid back pain radiating to chest, nausea, shortness of breath  EXAM: PORTABLE CHEST - 1 VIEW   COMPARISON:  09/07/2013  FINDINGS: Lungs are clear.  No pleural effusion or pneumothorax.  The heart is normal in size.  IMPRESSION: No evidence of acute cardiopulmonary disease.   Electronically Signed   By: Julian Hy M.D.   On: 06/29/2014 08:57     Impression:  Active Problems:   DKA (diabetic ketoacidoses)   DM (diabetes mellitus) type 2, uncontrolled, with ketoacidosis   Musculoskeletal pain   Plan:  DKA has resolved. He still is non compliant. His a1c is not known since February 2015 when it was 12%. He will need continued basal/bolus insulin, and repeat a1c and Thyroid function tests.  He may  be transferred to regular floors.  I have readjusted his lantus at 30 units qhs, initiate Novolog 5 units TIDAC plus correction. He may re qualify for Metformin and other oral meds as out patient. i have re-counseled him on risks and complications of uncontrolled type 2 DM and promises to see me in office in 1 week. This patient is extremely high risk for acute and chronic complications of diabetes and he does not realize  any of that. He tends to minimize all that information . He may need mental health evaluation as out patient.    Loni Beckwith, MD 06/30/2014, 12:23 PM

## 2014-06-30 NOTE — Progress Notes (Signed)
Subjective: Patient was admitted  Yesterday due DKA. He known to be noncompliant on his medications and diet. He is also complaining of back pain.  Objective: Vital signs in last 24 hours: Temp:  [97.4 F (36.3 C)-98.8 F (37.1 C)] 98.8 F (37.1 C) (11/30 0753) Pulse Rate:  [54-86] 66 (11/30 0700) Resp:  [10-31] 23 (11/30 0100) BP: (88-138)/(60-107) 120/72 mmHg (11/30 0700) SpO2:  [86 %-100 %] 99 % (11/30 0700) Weight:  [71.5 kg (157 lb 10.1 oz)-77.111 kg (170 lb)] 71.5 kg (157 lb 10.1 oz) (11/30 0500) Weight change:  Last BM Date: 06/28/14  Intake/Output from previous day: 11/29 0701 - 11/30 0700 In: 2435.5 [I.V.:2435.5] Out: -   PHYSICAL EXAM General appearance: alert and no distress Resp: clear to auscultation bilaterally Cardio: S1, S2 normal GI: soft, non-tender; bowel sounds normal; no masses,  no organomegaly Extremities: extremities normal, atraumatic, no cyanosis or edema  Lab Results:  Results for orders placed or performed during the hospital encounter of 06/29/14 (from the past 48 hour(s))  CBG monitoring, ED     Status: Abnormal   Collection Time: 06/29/14  8:04 AM  Result Value Ref Range   Glucose-Capillary 527 (H) 70 - 99 mg/dL   Comment 1 Notify RN   Troponin I     Status: None   Collection Time: 06/29/14  8:17 AM  Result Value Ref Range   Troponin I <0.30 <0.30 ng/mL    Comment:        Due to the release kinetics of cTnI, a negative result within the first hours of the onset of symptoms does not rule out myocardial infarction with certainty. If myocardial infarction is still suspected, repeat the test at appropriate intervals.   CBC with Differential     Status: Abnormal   Collection Time: 06/29/14  8:17 AM  Result Value Ref Range   WBC 4.9 4.0 - 10.5 K/uL   RBC 4.59 4.22 - 5.81 MIL/uL   Hemoglobin 15.0 13.0 - 17.0 g/dL   HCT 40.6 39.0 - 52.0 %   MCV 88.5 78.0 - 100.0 fL   MCH 32.7 26.0 - 34.0 pg   MCHC 36.9 (H) 30.0 - 36.0 g/dL   RDW 13.2  11.5 - 15.5 %   Platelets 182 150 - 400 K/uL   Neutrophils Relative % 47 43 - 77 %   Neutro Abs 2.3 1.7 - 7.7 K/uL   Lymphocytes Relative 44 12 - 46 %   Lymphs Abs 2.1 0.7 - 4.0 K/uL   Monocytes Relative 8 3 - 12 %   Monocytes Absolute 0.4 0.1 - 1.0 K/uL   Eosinophils Relative 1 0 - 5 %   Eosinophils Absolute 0.0 0.0 - 0.7 K/uL   Basophils Relative 1 0 - 1 %   Basophils Absolute 0.0 0.0 - 0.1 K/uL  Comprehensive metabolic panel     Status: Abnormal   Collection Time: 06/29/14  8:17 AM  Result Value Ref Range   Sodium 133 (L) 137 - 147 mEq/L   Potassium 4.7 3.7 - 5.3 mEq/L   Chloride 94 (L) 96 - 112 mEq/L   CO2 18 (L) 19 - 32 mEq/L   Glucose, Bld 479 (H) 70 - 99 mg/dL   BUN 16 6 - 23 mg/dL   Creatinine, Ser 0.92 0.50 - 1.35 mg/dL   Calcium 8.9 8.4 - 10.5 mg/dL   Total Protein 6.3 6.0 - 8.3 g/dL   Albumin 3.4 (L) 3.5 - 5.2 g/dL   AST 10 0 -  37 U/L   ALT <5 0 - 53 U/L   Alkaline Phosphatase 81 39 - 117 U/L   Total Bilirubin 0.3 0.3 - 1.2 mg/dL   GFR calc non Af Amer >90 >90 mL/min   GFR calc Af Amer >90 >90 mL/min    Comment: (NOTE) The eGFR has been calculated using the CKD EPI equation. This calculation has not been validated in all clinical situations. eGFR's persistently <90 mL/min signify possible Chronic Kidney Disease.   Lipase, blood     Status: Abnormal   Collection Time: 06/29/14  8:17 AM  Result Value Ref Range   Lipase 80 (H) 11 - 59 U/L  D-dimer, quantitative     Status: None   Collection Time: 06/29/14  8:17 AM  Result Value Ref Range   D-Dimer, Quant 0.32 0.00 - 0.48 ug/mL-FEU    Comment:        AT THE INHOUSE ESTABLISHED CUTOFF VALUE OF 0.48 ug/mL FEU, THIS ASSAY HAS BEEN DOCUMENTED IN THE LITERATURE TO HAVE A SENSITIVITY AND NEGATIVE PREDICTIVE VALUE OF AT LEAST 98 TO 99%.  THE TEST RESULT SHOULD BE CORRELATED WITH AN ASSESSMENT OF THE CLINICAL PROBABILITY OF DVT / VTE.   Magnesium     Status: None   Collection Time: 06/29/14  8:17 AM  Result  Value Ref Range   Magnesium 1.9 1.5 - 2.5 mg/dL  Blood gas, venous     Status: Abnormal   Collection Time: 06/29/14  9:45 AM  Result Value Ref Range   pH, Ven 7.293 7.250 - 7.300   pCO2, Ven 42.6 (L) 45.0 - 50.0 mmHg   pO2, Ven BELOW REPORTABLE RANGE 30.0 - 45.0 mmHg    Comment: BECCA MINTER RN AT 0945 BY ROBIN POWON 06/29/14   Bicarbonate 20.0 20.0 - 24.0 mEq/L   TCO2 17.9 0 - 100 mmol/L   Acid-base deficit 5.4 (H) 0.0 - 2.0 mmol/L   O2 Saturation 49.0 %   Patient temperature 37.0    Drawn by [id number]    Sample type VENOUS   POC CBG, ED     Status: Abnormal   Collection Time: 06/29/14 10:01 AM  Result Value Ref Range   Glucose-Capillary 346 (H) 70 - 99 mg/dL  CBG monitoring, ED     Status: Abnormal   Collection Time: 06/29/14 11:21 AM  Result Value Ref Range   Glucose-Capillary 309 (H) 70 - 99 mg/dL  Glucose, capillary     Status: Abnormal   Collection Time: 06/29/14 12:29 PM  Result Value Ref Range   Glucose-Capillary 247 (H) 70 - 99 mg/dL  MRSA PCR Screening     Status: None   Collection Time: 06/29/14 12:35 PM  Result Value Ref Range   MRSA by PCR NEGATIVE NEGATIVE    Comment:        The GeneXpert MRSA Assay (FDA approved for NASAL specimens only), is one component of a comprehensive MRSA colonization surveillance program. It is not intended to diagnose MRSA infection nor to guide or monitor treatment for MRSA infections.   Glucose, capillary     Status: Abnormal   Collection Time: 06/29/14  1:27 PM  Result Value Ref Range   Glucose-Capillary 210 (H) 70 - 99 mg/dL   Comment 1 Notify RN   Basic metabolic panel     Status: Abnormal   Collection Time: 06/29/14  2:15 PM  Result Value Ref Range   Sodium 139 137 - 147 mEq/L   Potassium 3.5 (L) 3.7 - 5.3 mEq/L  Comment: DELTA CHECK NOTED   Chloride 104 96 - 112 mEq/L    Comment: DELTA CHECK NOTED   CO2 25 19 - 32 mEq/L   Glucose, Bld 144 (H) 70 - 99 mg/dL   BUN 11 6 - 23 mg/dL   Creatinine, Ser 0.76 0.50 - 1.35  mg/dL   Calcium 7.9 (L) 8.4 - 10.5 mg/dL   GFR calc non Af Amer >90 >90 mL/min   GFR calc Af Amer >90 >90 mL/min    Comment: (NOTE) The eGFR has been calculated using the CKD EPI equation. This calculation has not been validated in all clinical situations. eGFR's persistently <90 mL/min signify possible Chronic Kidney Disease.    Anion gap 10 5 - 15  Glucose, capillary     Status: Abnormal   Collection Time: 06/29/14  2:27 PM  Result Value Ref Range   Glucose-Capillary 169 (H) 70 - 99 mg/dL  Glucose, capillary     Status: Abnormal   Collection Time: 06/29/14  3:25 PM  Result Value Ref Range   Glucose-Capillary 147 (H) 70 - 99 mg/dL   Comment 1 Notify RN   Glucose, capillary     Status: Abnormal   Collection Time: 06/29/14  4:23 PM  Result Value Ref Range   Glucose-Capillary 182 (H) 70 - 99 mg/dL   Comment 1 Notify RN   Glucose, capillary     Status: Abnormal   Collection Time: 06/29/14  5:07 PM  Result Value Ref Range   Glucose-Capillary 187 (H) 70 - 99 mg/dL   Comment 1 Notify RN   Glucose, capillary     Status: Abnormal   Collection Time: 06/29/14  9:10 PM  Result Value Ref Range   Glucose-Capillary 200 (H) 70 - 99 mg/dL   Comment 1 Notify RN   CBC     Status: Abnormal   Collection Time: 06/30/14  5:28 AM  Result Value Ref Range   WBC 5.8 4.0 - 10.5 K/uL   RBC 4.00 (L) 4.22 - 5.81 MIL/uL   Hemoglobin 12.9 (L) 13.0 - 17.0 g/dL   HCT 35.7 (L) 39.0 - 52.0 %   MCV 89.3 78.0 - 100.0 fL   MCH 32.3 26.0 - 34.0 pg   MCHC 36.1 (H) 30.0 - 36.0 g/dL   RDW 13.5 11.5 - 15.5 %   Platelets 181 150 - 400 K/uL  Basic metabolic panel     Status: Abnormal   Collection Time: 06/30/14  5:28 AM  Result Value Ref Range   Sodium 139 137 - 147 mEq/L   Potassium 4.1 3.7 - 5.3 mEq/L   Chloride 104 96 - 112 mEq/L   CO2 25 19 - 32 mEq/L   Glucose, Bld 194 (H) 70 - 99 mg/dL   BUN 10 6 - 23 mg/dL   Creatinine, Ser 0.78 0.50 - 1.35 mg/dL   Calcium 7.6 (L) 8.4 - 10.5 mg/dL   GFR calc non  Af Amer >90 >90 mL/min   GFR calc Af Amer >90 >90 mL/min    Comment: (NOTE) The eGFR has been calculated using the CKD EPI equation. This calculation has not been validated in all clinical situations. eGFR's persistently <90 mL/min signify possible Chronic Kidney Disease.    Anion gap 10 5 - 15  Glucose, capillary     Status: Abnormal   Collection Time: 06/30/14  7:39 AM  Result Value Ref Range   Glucose-Capillary 180 (H) 70 - 99 mg/dL   Comment 1 Documented in Chart  Comment 2 Notify RN     ABGS  Recent Labs  06/29/14 0945  TCO2 17.9  HCO3 20.0   CULTURES Recent Results (from the past 240 hour(s))  MRSA PCR Screening     Status: None   Collection Time: 06/29/14 12:35 PM  Result Value Ref Range Status   MRSA by PCR NEGATIVE NEGATIVE Final    Comment:        The GeneXpert MRSA Assay (FDA approved for NASAL specimens only), is one component of a comprehensive MRSA colonization surveillance program. It is not intended to diagnose MRSA infection nor to guide or monitor treatment for MRSA infections.    Studies/Results: Ct Abdomen Pelvis W Contrast  06/29/2014   CLINICAL DATA:  mid back pain that radiates into chest. Per patient started this morning when he got out of bed-states "brought me to my knees."Patient reports shortness of breath and nausea. Per patient "Every time he breaths in it hurts." Left lower abd pain  EXAM: CT ABDOMEN AND PELVIS WITH CONTRAST  TECHNIQUE: Multidetector CT imaging of the abdomen and pelvis was performed using the standard protocol following bolus administration of intravenous contrast.  CONTRAST:  71m OMNIPAQUE IOHEXOL 300 MG/ML SOLN, 1056mOMNIPAQUE IOHEXOL 300 MG/ML SOLN  COMPARISON:  02/20/2012  FINDINGS: Visualized lung bases clear. Unremarkable liver, nondilated gallbladder, spleen, adrenal glands, kidneys, pancreas. No hydronephrosis. Atheromatous nondilated abdominal aorta. Portal vein patent. Stomach, small bowel, and colon are  nondilated. Moderate colonic and rectal fecal material. Urinary bladder physiologically distended. Mild prostatic enlargement. Bilateral pelvic phleboliths. No ascites. No free air. No adenopathy localized.Regional bones unremarkable.  IMPRESSION: 1. No acute abdominal process. 2. Moderate colonic and rectal fecal material without evidence of obstruction or focal inflammatory change.   Electronically Signed   By: DaArne Cleveland.D.   On: 06/29/2014 10:25   Dg Chest Port 1 View  06/29/2014   CLINICAL DATA:  Mid back pain radiating to chest, nausea, shortness of breath  EXAM: PORTABLE CHEST - 1 VIEW  COMPARISON:  09/07/2013  FINDINGS: Lungs are clear.  No pleural effusion or pneumothorax.  The heart is normal in size.  IMPRESSION: No evidence of acute cardiopulmonary disease.   Electronically Signed   By: SrJulian Hy.D.   On: 06/29/2014 08:57    Medications: I have reviewed the patient's current medications.  Assesment:   Active Problems:   DKA (diabetic ketoacidoses)   DM (diabetes mellitus) type 2, uncontrolled, with ketoacidosis   Musculoskeletal pain    Plan: Medications reviewed Will continue insulin therapy Endocrine consult Continue regular treatment.    LOS: 1 day   Bonny Vanleeuwen 06/30/2014, 8:00 AM

## 2014-06-30 NOTE — Progress Notes (Addendum)
PT IS VERY INSISTANT THAT HE GOES HOME TODAY. DR Legrand Rams IS NOT READY TO DISCHARGE HIM. THIS WAS EXPLAINED TO PT AND HE WAS INFORMED THAT WHILE HE CAN LEAVE AMA, HIS MEDICARE WILL NOT PAY FOR HIS HOSPITALIZATION IF HE LEAVES AMA. HE WOULD BE FINANCIALLY RESPONSIBLE. IV STOPPED AND NSL AT PT'S REQUEST.

## 2014-07-01 LAB — GLUCOSE, CAPILLARY: Glucose-Capillary: 152 mg/dL — ABNORMAL HIGH (ref 70–99)

## 2014-07-01 MED ORDER — INSULIN ASPART 100 UNIT/ML ~~LOC~~ SOLN: 5.0000 [IU] | Freq: Three times a day (TID) | SUBCUTANEOUS | Status: DC

## 2014-07-01 MED ORDER — INSULIN GLARGINE 100 UNIT/ML ~~LOC~~ SOLN: 30.0000 [IU] | Freq: Every day | SUBCUTANEOUS | Status: DC

## 2014-07-01 NOTE — Progress Notes (Signed)
DISCHARGE INSTRUCTIONS GIVEN. PT ALERT AND ORIENTED. SKIN WARM AND DRY. PT IS WAITING  TO RECEIVE CANE. AUNT TO PICK HIM UP.

## 2014-07-02 NOTE — Discharge Summary (Signed)
Physician Discharge Summary  Patient ID: Melvin Taylor MRN: [id number] DOB/AGE: 1966/06/23 48 y.o. Primary Care Physician:Brady Schiller, MD Admit date: 06/29/2014 Discharge date 07/01/14   Discharge Diagnoses:   Active Problems:   DKA (diabetic ketoacidoses)   DM (diabetes mellitus) type 2, uncontrolled, with ketoacidosis   Musculoskeletal pain hypertension  Seizure disoder Bipolar disease    Medication List    STOP taking these medications        LANTUS SOLOSTAR 100 UNIT/ML Solostar Pen  Generic drug:  Insulin Glargine  Replaced by:  insulin glargine 100 UNIT/ML injection      TAKE these medications        ALPRAZolam 1 MG tablet  Commonly known as:  XANAX  Take 1 tablet (1 mg total) by mouth 3 (three) times daily as needed for anxiety.     amLODipine 10 MG tablet  Commonly known as:  NORVASC  Take 10 mg by mouth daily.     atenolol 100 MG tablet  Commonly known as:  TENORMIN  Take 100 mg by mouth daily.     cyclobenzaprine 10 MG tablet  Commonly known as:  FLEXERIL  Take 1 tablet (10 mg total) by mouth 3 (three) times daily as needed for muscle spasms (and back pain).     divalproex 500 MG DR tablet  Commonly known as:  DEPAKOTE  Take 1,500 mg by mouth at bedtime.     hydrochlorothiazide 25 MG tablet  Commonly known as:  HYDRODIURIL  Take 25 mg by mouth daily.     insulin aspart 100 UNIT/ML injection  Commonly known as:  novoLOG  Inject 5 Units into the skin 3 (three) times daily with meals.     insulin glargine 100 UNIT/ML injection  Commonly known as:  LANTUS  Inject 0.3 mLs (30 Units total) into the skin at bedtime.     levETIRAcetam 500 MG tablet  Commonly known as:  KEPPRA  Take 1,500 mg by mouth at bedtime.     metFORMIN 1000 MG tablet  Commonly known as:  GLUCOPHAGE  Take 1,000 mg by mouth 2 (two) times daily.        Discharged Condition: improved    Consults: endocrine  Significant Diagnostic Studies: Ct Abdomen Pelvis W  Contrast  06/29/2014   CLINICAL DATA:  mid back pain that radiates into chest. Per patient started this morning when he got out of bed-states "brought me to my knees."Patient reports shortness of breath and nausea. Per patient "Every time he breaths in it hurts." Left lower abd pain  EXAM: CT ABDOMEN AND PELVIS WITH CONTRAST  TECHNIQUE: Multidetector CT imaging of the abdomen and pelvis was performed using the standard protocol following bolus administration of intravenous contrast.  CONTRAST:  25mL OMNIPAQUE IOHEXOL 300 MG/ML SOLN, 167mL OMNIPAQUE IOHEXOL 300 MG/ML SOLN  COMPARISON:  02/20/2012  FINDINGS: Visualized lung bases clear. Unremarkable liver, nondilated gallbladder, spleen, adrenal glands, kidneys, pancreas. No hydronephrosis. Atheromatous nondilated abdominal aorta. Portal vein patent. Stomach, small bowel, and colon are nondilated. Moderate colonic and rectal fecal material. Urinary bladder physiologically distended. Mild prostatic enlargement. Bilateral pelvic phleboliths. No ascites. No free air. No adenopathy localized.Regional bones unremarkable.  IMPRESSION: 1. No acute abdominal process. 2. Moderate colonic and rectal fecal material without evidence of obstruction or focal inflammatory change.   Electronically Signed   By: Arne Cleveland M.D.   On: 06/29/2014 10:25   Dg Chest Port 1 View  06/29/2014   CLINICAL DATA:  Mid back pain radiating to chest,  nausea, shortness of breath  EXAM: PORTABLE CHEST - 1 VIEW  COMPARISON:  09/07/2013  FINDINGS: Lungs are clear.  No pleural effusion or pneumothorax.  The heart is normal in size.  IMPRESSION: No evidence of acute cardiopulmonary disease.   Electronically Signed   By: Julian Hy M.D.   On: 06/29/2014 08:57    Lab Results: Basic Metabolic Panel:  Recent Labs  06/29/14 0817 06/29/14 1415 06/30/14 0528  NA 133* 139 139  K 4.7 3.5* 4.1  CL 94* 104 104  CO2 18* 25 25  GLUCOSE 479* 144* 194*  BUN 16 11 10   CREATININE 0.92  0.76 0.78  CALCIUM 8.9 7.9* 7.6*  MG 1.9  --   --    Liver Function Tests:  Recent Labs  06/29/14 0817  AST 10  ALT <5  ALKPHOS 81  BILITOT 0.3  PROT 6.3  ALBUMIN 3.4*     CBC:  Recent Labs  06/29/14 0817 06/30/14 0528  WBC 4.9 5.8  NEUTROABS 2.3  --   HGB 15.0 12.9*  HCT 40.6 35.7*  MCV 88.5 89.3  PLT 182 181    Recent Results (from the past 240 hour(s))  MRSA PCR Screening     Status: None   Collection Time: 06/29/14 12:35 PM  Result Value Ref Range Status   MRSA by PCR NEGATIVE NEGATIVE Final    Comment:        The GeneXpert MRSA Assay (FDA approved for NASAL specimens only), is one component of a comprehensive MRSA colonization surveillance program. It is not intended to diagnose MRSA infection nor to guide or monitor treatment for MRSA infections.      Hospital Course:   This is a 48 years old male patient with history of multiple medical illnesses was admitted due to DKA. He is known to be noncompliant on his treatment and his HGA1C was above 15. Endocrine consult was done and his medications were adjusted. Patient improved and discharged in stable condition. Patient was strongly advised to comply on his medications and out patient follow up.  Discharge Exam: Blood pressure 126/98, pulse 61, temperature 98 F (36.7 C), temperature source Oral, resp. rate 27, height 5\' 7"  (1.702 m), weight 72 kg (158 lb 11.7 oz), SpO2 100 %.   Disposition:  home        Follow-up Information    Follow up with NIDA,GEBRESELASSIE, MD. Go on 07/09/2014.   Specialty:  Endocrinology   Why:  SEE DR NIDA ON 07/09/14 AT 0900AM   Contact information:   [place] [address] [phone]       Follow up with Halil Rentz, MD In 2 weeks.   Specialty:  Internal Medicine   Contact information:   [place] [address] [phone]       Signed: Rosita Fire   07/02/2014, 7:56 AM

## 2014-07-09 ENCOUNTER — Encounter: Payer: Medicaid Other | Attending: "Endocrinology | Admitting: Nutrition

## 2014-07-09 VITALS — Ht 66.0 in | Wt 160.0 lb

## 2014-07-09 DIAGNOSIS — Z794 Long term (current) use of insulin: Secondary | ICD-10-CM | POA: Diagnosis not present

## 2014-07-09 DIAGNOSIS — R739 Hyperglycemia, unspecified: Secondary | ICD-10-CM | POA: Diagnosis not present

## 2014-07-09 DIAGNOSIS — Z713 Dietary counseling and surveillance: Secondary | ICD-10-CM | POA: Diagnosis not present

## 2014-07-09 DIAGNOSIS — IMO0002 Reserved for concepts with insufficient information to code with codable children: Secondary | ICD-10-CM

## 2014-07-09 DIAGNOSIS — E118 Type 2 diabetes mellitus with unspecified complications: Secondary | ICD-10-CM | POA: Insufficient documentation

## 2014-07-09 DIAGNOSIS — E1165 Type 2 diabetes mellitus with hyperglycemia: Secondary | ICD-10-CM

## 2014-07-10 NOTE — Progress Notes (Signed)
  Medical Nutrition Therapy:  Appt start time: 0930 end time:  1000.   Assessment:  Primary concerns today: Diabetes. He was seen by Dr. Dorris Fetch today. Hasn't been as compliant with his meds or his diet as he needs to. Was recently discharged from the hospital over Thanksgiving due to high blood sugars. Carious teeth. Admits to craving sweets and drinking sweetened beverages. Has been losing weight. Stays hungry, thirty and tired often. ON 30 units of Lantus daily and started on Novolog 5 units with meals today.Most recent A1C was 15.2%. He has been noncompliant with medications and diet.  Preferred Learning Style:     No preference indicated   Learning Readiness:     Ready  Change in progress   MEDICATIONS: see list. ON Metformin and Invokana, Lantus and started on Novolog with meals today.   DIETARY INTAKE: .    He doesn't have a particular eating pattern right now. Drinks sweet beverages and ate too many things during the holidays he knows he shouldn't eat.   Usual physical activity: Regular daily activities but  No planned exercise.  Estimated energy needs: 2000 calories 225 g carbohydrates 150 g protein 56 g fat  Progress Towards Goal(s):  In progress.   Nutritional Diagnosis:  NB-1.1 Food and nutrition-related knowledge deficit As related to Diabetes.  As evidenced by A1C.    Intervention:  Nutrition counseling on diabetes, meal planning, portion control, target ranges for DM, complications and importance of compliance with diet and medications.. Plan:  Aim for 3-4 Carb Choices per meal (45-60 grams) +/- 1 either way  Avoid snacks between meals. Cut out sodas, sweets and junk food, Test blood sugars before meals and bedtime and record on Blood sugar log sheet. Bring to all appointments Take 30 units of Lantus daily and 5 units of Novolog before meals plus sliding scale as prescribed  Increase water intake to 8 cups per day. Goal: Get A1C down to 10% or less in  three months.  Teaching Method Utilized:  Visual Auditory Hands on  Handouts given during visit include: The Plate Method Carb Counting and Food Label handouts Meal Plan Card   Barriers to learning/adherence to lifestyle change: none  Demonstrated degree of understanding via:  Teach Back   Monitoring/Evaluation:  Dietary intake, exercise, meal planning, SBG, and body weight in 1 week(s).

## 2014-07-16 ENCOUNTER — Encounter: Payer: Medicaid Other | Admitting: Nutrition

## 2014-07-17 ENCOUNTER — Encounter: Payer: Self-pay | Admitting: Nutrition

## 2014-07-17 NOTE — Patient Instructions (Signed)
Plan:  Aim for 3-4 Carb Choices per meal (45-60 grams) +/- 1 either way  Avoid snacks between meals. Cut out sodas, sweets and junk food, Test blood sugars before meals and bedtime and record on Blood sugar log sheet. Bring to all appointments Take 30 units of Lantus daily and 5 units of Novolog before meals plus sliding scale as prescribed  Increase water intake to 8 cups per day. Goal: Get A1C down to 10% or less in three months.

## 2014-07-21 ENCOUNTER — Ambulatory Visit: Payer: Medicaid Other | Admitting: Nutrition

## 2014-07-30 ENCOUNTER — Ambulatory Visit: Payer: Medicaid Other | Admitting: Nutrition

## 2014-08-06 ENCOUNTER — Encounter: Payer: Medicaid Other | Attending: "Endocrinology | Admitting: Nutrition

## 2014-08-06 ENCOUNTER — Encounter: Payer: Self-pay | Admitting: Nutrition

## 2014-08-06 VITALS — Ht 66.0 in | Wt 163.0 lb

## 2014-08-06 DIAGNOSIS — E1165 Type 2 diabetes mellitus with hyperglycemia: Secondary | ICD-10-CM

## 2014-08-06 DIAGNOSIS — IMO0002 Reserved for concepts with insufficient information to code with codable children: Secondary | ICD-10-CM

## 2014-08-06 DIAGNOSIS — Z794 Long term (current) use of insulin: Secondary | ICD-10-CM | POA: Diagnosis not present

## 2014-08-06 DIAGNOSIS — Z713 Dietary counseling and surveillance: Secondary | ICD-10-CM | POA: Diagnosis not present

## 2014-08-06 DIAGNOSIS — E118 Type 2 diabetes mellitus with unspecified complications: Secondary | ICD-10-CM | POA: Diagnosis present

## 2014-08-06 NOTE — Progress Notes (Signed)
  Medical Nutrition Therapy:  Appt start time: 0930 end time:  1000.   Assessment:  Primary concerns today: Diabetes. He has cut out the sodas, sweets.He notes he is eating much better now. Feels better. BS log brought in. He has been testing before and 1 hr after meals. BS range in the mid 200's. Better than previous A1C of 15.2%. He is taking 30 units of Lantus and 10 units of Novolog with meals. He hasn't been following his sliding scale because he didn't understand how to use it properly. Has been testing blood sugars right after he inject insulin instead of before. Eating more fresh fruits, vegetables and staying away from lots of junk food. Feels better. Drinking more water. He seems to be complying with medications currently. Reeducated on how to use sliding scale insulin coverage for BS over 150 mg/dl. He verbalized understanding now how to use the sliding scale insulin coverage.   Preferred Learning Style:     No preference indicated   Learning Readiness:     Ready  Change in progress   MEDICATIONS: see list. ON Metformin and Invokana, Lantus and started on Novolog with meals today.   DIETARY INTAKE: B) Raisin bran, whole milk . Coffee Snack:  Granola bar L) [place] sandwich, carrots and green beans, water Snack: none Dinner:  Baked pork chop, green beans, carrots,   Usual physical activity: Regular daily activities but  No planned exercise.  Estimated energy needs: 2000 calories 225 g carbohydrates 150 g protein 56 g fat  Progress Towards Goal(s):  In progress.   Nutritional Diagnosis:  NB-1.1 Food and nutrition-related knowledge deficit As related to Diabetes.  As evidenced by A1C of 15%    Intervention:  Nutrition counseling on diabetes, meal planning, portion control, target ranges for DM, complications and importance of compliance with diet and medications..  Plan:  Your blood sugars look much better. Keep up the good work!! Aim for 3-4 Carb Choices per meal  (45-60 grams) +/- 1 either way  Avoid snacks between meals. Cut out sodas, sweets and junk food, Test blood sugars before meals and bedtime and record on Blood sugar log sheet. Bring to all appointments. You do not have to test blood sugars 2 hours after meals. Test blood sugar before taking insulin before meals. Take 30 units of Lantus daily and 10 units of Novolog before meals plus sliding scale as prescribed  Increase water intake to 8 cups per day. Goal: Get A1C down to 10% or less in three months.  Teaching Method Utilized:  Visual Auditory Hands on  Handouts given during visit include: The Plate Method Carb Counting and Food Label handouts Meal Plan Card   Barriers to learning/adherence to lifestyle change: none  Demonstrated degree of understanding via:  Teach Back   Monitoring/Evaluation:  Dietary intake, exercise, meal planning, SBG, and body weight in 1 months).

## 2014-08-06 NOTE — Patient Instructions (Signed)
Plan:   Your blood sugars look much better. Keep up the good work!! Aim for 3-4 Carb Choices per meal (45-60 grams) +/- 1 either way  Avoid snacks between meals. Cut out sodas, sweets and junk food, Test blood sugars before meals and bedtime and record on Blood sugar log sheet. Bring to all appointments. You do not have to test blood sugars 2 hours after meals. Test blood sugar before taking insulin before meals. Take 30 units of Lantus daily and 10 units of Novolog before meals plus sliding scale as prescribed  Increase water intake to 8 cups per day. Goal: Get A1C down to 10% or less in three months

## 2014-08-12 ENCOUNTER — Telehealth: Payer: Self-pay | Admitting: Orthopedic Surgery

## 2014-08-12 ENCOUNTER — Emergency Department (HOSPITAL_COMMUNITY): Payer: Medicaid Other

## 2014-08-12 ENCOUNTER — Emergency Department (HOSPITAL_COMMUNITY)
Admission: EM | Admit: 2014-08-12 | Discharge: 2014-08-12 | Disposition: A | Discharge status: Home / Self Care | Payer: Medicaid Other | Attending: Emergency Medicine | Admitting: Emergency Medicine

## 2014-08-12 ENCOUNTER — Encounter (HOSPITAL_COMMUNITY): Payer: Self-pay | Admitting: Cardiology

## 2014-08-12 DIAGNOSIS — I10 Essential (primary) hypertension: Secondary | ICD-10-CM | POA: Diagnosis not present

## 2014-08-12 DIAGNOSIS — Z720 Tobacco use: Secondary | ICD-10-CM | POA: Insufficient documentation

## 2014-08-12 DIAGNOSIS — M25512 Pain in left shoulder: Secondary | ICD-10-CM | POA: Diagnosis not present

## 2014-08-12 DIAGNOSIS — Z79899 Other long term (current) drug therapy: Secondary | ICD-10-CM | POA: Diagnosis not present

## 2014-08-12 DIAGNOSIS — R52 Pain, unspecified: Secondary | ICD-10-CM

## 2014-08-12 DIAGNOSIS — F319 Bipolar disorder, unspecified: Secondary | ICD-10-CM | POA: Insufficient documentation

## 2014-08-12 DIAGNOSIS — G40909 Epilepsy, unspecified, not intractable, without status epilepticus: Secondary | ICD-10-CM | POA: Diagnosis not present

## 2014-08-12 DIAGNOSIS — Z794 Long term (current) use of insulin: Secondary | ICD-10-CM | POA: Insufficient documentation

## 2014-08-12 DIAGNOSIS — F419 Anxiety disorder, unspecified: Secondary | ICD-10-CM | POA: Diagnosis not present

## 2014-08-12 DIAGNOSIS — E119 Type 2 diabetes mellitus without complications: Secondary | ICD-10-CM | POA: Diagnosis not present

## 2014-08-12 DIAGNOSIS — M2550 Pain in unspecified joint: Secondary | ICD-10-CM

## 2014-08-12 LAB — CBG MONITORING, ED: Glucose-Capillary: 175 mg/dL — ABNORMAL HIGH (ref 70–99)

## 2014-08-12 MED ORDER — HYDROCODONE-ACETAMINOPHEN 5-325 MG PO TABS: 1.0000 | ORAL_TABLET | Freq: Four times a day (QID) | ORAL | Status: DC | PRN
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
## 2014-08-12 NOTE — Discharge Instructions (Signed)

## 2014-08-12 NOTE — Telephone Encounter (Signed)
Patient called upon discharge from [facility]; states he mainly was hospitalized due to "sugar being so high"; states also his left shoulder is huring; Xrays had been done while in-patient.  Offered appointment upon receipt of primary care referral, which insurance information in system indicates. Patient states he will call his primary care, Dr Legrand Rams, and request referral to be sent.  His ph# is [phone]

## 2014-08-12 NOTE — ED Notes (Addendum)
C/o left arm and left leg pain since this morning.   Was recently hospitalized here with hyperglycemia.  Seen PCP this morning for checkup and pt mentioned the pain  ,  PCP told him he could not do anything for him.

## 2014-08-28 NOTE — Telephone Encounter (Signed)
As of 08/28/14, no referral or further response received.

## 2014-09-03 ENCOUNTER — Encounter (HOSPITAL_COMMUNITY): Payer: Self-pay | Admitting: Cardiology

## 2014-09-03 ENCOUNTER — Emergency Department (HOSPITAL_COMMUNITY)
Admission: EM | Admit: 2014-09-03 | Discharge: 2014-09-03 | Disposition: A | Discharge status: Home / Self Care | Payer: Medicaid Other | Attending: Emergency Medicine | Admitting: Emergency Medicine

## 2014-09-03 ENCOUNTER — Emergency Department (HOSPITAL_COMMUNITY): Payer: Medicaid Other

## 2014-09-03 DIAGNOSIS — S99921A Unspecified injury of right foot, initial encounter: Secondary | ICD-10-CM | POA: Diagnosis present

## 2014-09-03 DIAGNOSIS — Y998 Other external cause status: Secondary | ICD-10-CM | POA: Diagnosis not present

## 2014-09-03 DIAGNOSIS — F419 Anxiety disorder, unspecified: Secondary | ICD-10-CM | POA: Diagnosis not present

## 2014-09-03 DIAGNOSIS — Y9389 Activity, other specified: Secondary | ICD-10-CM | POA: Diagnosis not present

## 2014-09-03 DIAGNOSIS — F319 Bipolar disorder, unspecified: Secondary | ICD-10-CM | POA: Diagnosis not present

## 2014-09-03 DIAGNOSIS — F209 Schizophrenia, unspecified: Secondary | ICD-10-CM | POA: Diagnosis not present

## 2014-09-03 DIAGNOSIS — Z720 Tobacco use: Secondary | ICD-10-CM | POA: Insufficient documentation

## 2014-09-03 DIAGNOSIS — Y9289 Other specified places as the place of occurrence of the external cause: Secondary | ICD-10-CM | POA: Diagnosis not present

## 2014-09-03 DIAGNOSIS — G40909 Epilepsy, unspecified, not intractable, without status epilepticus: Secondary | ICD-10-CM | POA: Diagnosis not present

## 2014-09-03 DIAGNOSIS — I10 Essential (primary) hypertension: Secondary | ICD-10-CM | POA: Diagnosis not present

## 2014-09-03 DIAGNOSIS — S90121A Contusion of right lesser toe(s) without damage to nail, initial encounter: Secondary | ICD-10-CM | POA: Insufficient documentation

## 2014-09-03 DIAGNOSIS — W228XXA Striking against or struck by other objects, initial encounter: Secondary | ICD-10-CM | POA: Diagnosis not present

## 2014-09-03 DIAGNOSIS — Z79899 Other long term (current) drug therapy: Secondary | ICD-10-CM | POA: Diagnosis not present

## 2014-09-03 DIAGNOSIS — Z794 Long term (current) use of insulin: Secondary | ICD-10-CM | POA: Insufficient documentation

## 2014-09-03 DIAGNOSIS — E119 Type 2 diabetes mellitus without complications: Secondary | ICD-10-CM | POA: Diagnosis not present

## 2014-09-03 NOTE — Discharge Instructions (Signed)
Ibuprofen 600 mg every 6 hours as needed for pain.  Follow-up with your Dr. if not improving in the next week.   Contusion A contusion is a deep bruise. Contusions are the result of an injury that caused bleeding under the skin. The contusion may turn blue, purple, or yellow. Minor injuries will give you a painless contusion, but more severe contusions may stay painful and swollen for a few weeks.  CAUSES  A contusion is usually caused by a blow, trauma, or direct force to an area of the body. SYMPTOMS   Swelling and redness of the injured area.  Bruising of the injured area.  Tenderness and soreness of the injured area.  Pain. DIAGNOSIS  The diagnosis can be made by taking a history and physical exam. An X-ray, CT scan, or MRI may be needed to determine if there were any associated injuries, such as fractures. TREATMENT  Specific treatment will depend on what area of the body was injured. In general, the best treatment for a contusion is resting, icing, elevating, and applying cold compresses to the injured area. Over-the-counter medicines may also be recommended for pain control. Ask your caregiver what the best treatment is for your contusion. HOME CARE INSTRUCTIONS   Put ice on the injured area.  Put ice in a plastic bag.  Place a towel between your skin and the bag.  Leave the ice on for 15-20 minutes, 3-4 times a day, or as directed by your health care provider.  Only take over-the-counter or prescription medicines for pain, discomfort, or fever as directed by your caregiver. Your caregiver may recommend avoiding anti-inflammatory medicines (aspirin, ibuprofen, and naproxen) for 48 hours because these medicines may increase bruising.  Rest the injured area.  If possible, elevate the injured area to reduce swelling. SEEK IMMEDIATE MEDICAL CARE IF:   You have increased bruising or swelling.  You have pain that is getting worse.  Your swelling or pain is not relieved  with medicines. MAKE SURE YOU:   Understand these instructions.  Will watch your condition.  Will get help right away if you are not doing well or get worse. Document Released: 04/27/2005 Document Revised: 07/23/2013 Document Reviewed: 05/23/2011 [facility] Patient Information 2015 [place], [place]. This information is not intended to replace advice given to you by your health care provider. Make sure you discuss any questions you have with your health care provider.

## 2014-09-03 NOTE — ED Notes (Signed)
Hit right pinky toe on table 3 days ago.

## 2014-09-03 NOTE — ED Notes (Signed)
EDP at bedside  

## 2014-09-03 NOTE — ED Provider Notes (Signed)
CSN: [id number]     Arrival date & time 09/03/14  0959 History  This chart was scribed for Veryl Speak, MD by [name], ED Scribe. This patient was seen in room APA14/[facility]14 and the patient's care was started at 10:06 AM.    Chief Complaint  Patient presents with  . Toe Injury   Patient is a 49 y.o. male presenting with toe pain. The history is provided by the patient. No language interpreter was used.  Toe Pain This is a new problem. The current episode started more than 2 days ago. The problem occurs constantly. The problem has not changed since onset.The symptoms are aggravated by walking. Nothing relieves the symptoms. He has tried nothing for the symptoms.     HPI Comments: Melvin Taylor is a 49 y.o. male with a history of DM who presents to the Emergency Department complaining of a right pinky toe injury that occurred when he hit a table 3 days ago. He complains of associated throbbing pain. Patient today went to see his endocrinologist, Dr. Dorris Fetch, who told him to come to the ED for an XR.    Past Medical History  Diagnosis Date  . Seizures   . Anxiety   . Depression   . Diabetes mellitus   . HTN (hypertension)   . Sleep apnea   . Bipolar 1 disorder   . Insomnia   . Headache(784.0)   . Schizophrenia    Past Surgical History  Procedure Laterality Date  . Mandible fracture surgery    . Knee surgery    . Tonsillectomy     Family History  Problem Relation Age of Onset  . Arthritis    . Diabetes    . Asthma     History  Substance Use Topics  . Smoking status: Current Every Day Smoker -- 1.00 packs/day for 30 years    Types: Cigarettes  . Smokeless tobacco: Never Used  . Alcohol Use: No    Review of Systems  A complete 10 system review of systems was obtained and all systems are negative except as noted in the HPI and PMH.    Allergies  Aspirin  Home Medications   Prior to Admission medications   Medication Sig Start Date End Date Taking? Authorizing  Provider  ALPRAZolam Duanne Moron) 1 MG tablet Take 1 tablet (1 mg total) by mouth 3 (three) times daily as needed for anxiety. Patient not taking: Reported on 06/29/2014 05/15/14   Ezequiel Essex, MD  amLODipine (NORVASC) 10 MG tablet Take 10 mg by mouth daily.     Historical Provider, MD  atenolol (TENORMIN) 100 MG tablet Take 100 mg by mouth daily.      Historical Provider, MD  canagliflozin (INVOKANA) 300 MG TABS tablet Take 300 mg by mouth daily before breakfast.    Historical Provider, MD  cyclobenzaprine (FLEXERIL) 10 MG tablet Take 1 tablet (10 mg total) by mouth 3 (three) times daily as needed for muscle spasms (and back pain). Patient not taking: Reported on 06/29/2014 05/06/14   Janice Norrie, MD  divalproex (DEPAKOTE) 500 MG EC tablet Take 1,500 mg by mouth at bedtime.     Historical Provider, MD  doxepin (SINEQUAN) 50 MG capsule Take 50 mg by mouth at bedtime.     Historical Provider, MD  hydrochlorothiazide (HYDRODIURIL) 25 MG tablet Take 25 mg by mouth daily.    Historical Provider, MD  HYDROcodone-acetaminophen (NORCO/VICODIN) 5-325 MG per tablet Take 1-2 tablets by mouth every 6 (six) hours  as needed. 08/12/14   Glendell Docker, NP  insulin aspart (NOVOLOG FLEXPEN) 100 UNIT/ML FlexPen Inject 10 Units into the skin 3 (three) times daily with meals.    Historical Provider, MD  insulin aspart (NOVOLOG) 100 UNIT/ML injection Inject 5 Units into the skin 3 (three) times daily with meals. Patient not taking: Reported on 08/12/2014 07/01/14   Rosita Fire, MD  Insulin Glargine (LANTUS SOLOSTAR) 100 UNIT/ML Solostar Pen Inject 30 Units into the skin daily at 10 pm.    Historical Provider, MD  insulin glargine (LANTUS) 100 UNIT/ML injection Inject 0.3 mLs (30 Units total) into the skin at bedtime. Patient not taking: Reported on 08/12/2014 07/01/14   Rosita Fire, MD  levETIRAcetam (KEPPRA) 500 MG tablet Take 1,500 mg by mouth at bedtime.     Historical Provider, MD  metFORMIN (GLUCOPHAGE) 1000 MG  tablet Take 1,000 mg by mouth 2 (two) times daily.      Historical Provider, MD   There were no vitals taken for this visit. Physical Exam  Constitutional: He is oriented to person, place, and time. He appears well-developed and well-nourished. No distress.  HENT:  Head: Normocephalic and atraumatic.  Eyes: Conjunctivae and EOM are normal.  Neck: Neck supple. No tracheal deviation present.  Cardiovascular: Normal rate.   Pulmonary/Chest: Effort normal. No respiratory distress.  Musculoskeletal: Normal range of motion. He exhibits edema and tenderness.  There is swelling, mild ecchymosis, and tenderness to the right fifth toe.   Neurological: He is alert and oriented to person, place, and time.  Skin: Skin is warm and dry.  Psychiatric: He has a normal mood and affect. His behavior is normal.  Nursing note and vitals reviewed.   ED Course  Procedures (including critical care time)  DIAGNOSTIC STUDIES: Oxygen Saturation is 100% on room air, normal by my interpretation.    COORDINATION OF CARE: 10:10 AM - Discussed treatment plan with pt at bedside which includes XR, and pt agreed to plan.  Labs Review Labs Reviewed - No data to display  Imaging Review No results found.   EKG Interpretation None      MDM   Final diagnoses:  None    X-rays are negative for fracture. Will recommend rest and when necessary return.   I personally performed the services described in this documentation, which was scribed in my presence. The recorded information has been reviewed and is accurate.      Veryl Speak, MD 09/03/14 1056

## 2014-09-08 ENCOUNTER — Encounter: Payer: Self-pay | Admitting: Orthopedic Surgery

## 2014-09-08 ENCOUNTER — Ambulatory Visit: Payer: MEDICAID | Admitting: Orthopedic Surgery

## 2014-09-10 ENCOUNTER — Encounter: Payer: Medicaid Other | Admitting: Nutrition

## 2014-09-24 ENCOUNTER — Ambulatory Visit: Payer: MEDICAID | Admitting: Nutrition

## 2014-10-03 ENCOUNTER — Encounter: Payer: Medicaid Other | Attending: "Endocrinology | Admitting: Nutrition

## 2014-10-03 DIAGNOSIS — E118 Type 2 diabetes mellitus with unspecified complications: Secondary | ICD-10-CM | POA: Insufficient documentation

## 2014-10-03 DIAGNOSIS — Z794 Long term (current) use of insulin: Secondary | ICD-10-CM | POA: Insufficient documentation

## 2014-10-03 DIAGNOSIS — R739 Hyperglycemia, unspecified: Secondary | ICD-10-CM | POA: Insufficient documentation

## 2014-10-03 DIAGNOSIS — Z713 Dietary counseling and surveillance: Secondary | ICD-10-CM | POA: Insufficient documentation

## 2014-10-07 ENCOUNTER — Encounter (HOSPITAL_COMMUNITY): Payer: Self-pay | Admitting: *Deleted

## 2014-10-07 ENCOUNTER — Emergency Department (HOSPITAL_COMMUNITY)
Admission: EM | Admit: 2014-10-07 | Discharge: 2014-10-07 | Disposition: A | Discharge status: Home / Self Care | Payer: Medicaid Other | Attending: Emergency Medicine | Admitting: Emergency Medicine

## 2014-10-07 DIAGNOSIS — Z794 Long term (current) use of insulin: Secondary | ICD-10-CM | POA: Insufficient documentation

## 2014-10-07 DIAGNOSIS — Z8669 Personal history of other diseases of the nervous system and sense organs: Secondary | ICD-10-CM | POA: Diagnosis not present

## 2014-10-07 DIAGNOSIS — E119 Type 2 diabetes mellitus without complications: Secondary | ICD-10-CM | POA: Diagnosis not present

## 2014-10-07 DIAGNOSIS — R441 Visual hallucinations: Secondary | ICD-10-CM | POA: Diagnosis not present

## 2014-10-07 DIAGNOSIS — Z79899 Other long term (current) drug therapy: Secondary | ICD-10-CM | POA: Insufficient documentation

## 2014-10-07 DIAGNOSIS — Z008 Encounter for other general examination: Secondary | ICD-10-CM | POA: Diagnosis present

## 2014-10-07 DIAGNOSIS — Z720 Tobacco use: Secondary | ICD-10-CM | POA: Insufficient documentation

## 2014-10-07 DIAGNOSIS — I10 Essential (primary) hypertension: Secondary | ICD-10-CM | POA: Diagnosis not present

## 2014-10-07 DIAGNOSIS — R440 Auditory hallucinations: Secondary | ICD-10-CM | POA: Insufficient documentation

## 2014-10-07 LAB — RAPID URINE DRUG SCREEN, HOSP PERFORMED
Amphetamines: NOT DETECTED
Barbiturates: NOT DETECTED
Benzodiazepines: NOT DETECTED
COCAINE: NOT DETECTED
OPIATES: NOT DETECTED
Tetrahydrocannabinol: POSITIVE — AB

## 2014-10-07 LAB — CBG MONITORING, ED: Glucose-Capillary: 303 mg/dL — ABNORMAL HIGH (ref 70–99)

## 2014-10-07 MED ORDER — HYDROCHLOROTHIAZIDE 25 MG PO TABS
25.0000 mg | ORAL_TABLET | Freq: Every day | ORAL | Status: DC
  Filled 2014-10-07 (×2): qty 1

## 2014-10-07 MED ORDER — ALUM & MAG HYDROXIDE-SIMETH 200-200-20 MG/5ML PO SUSP: 30.0000 mL | ORAL | Status: DC | PRN

## 2014-10-07 MED ORDER — INSULIN GLARGINE 100 UNIT/ML ~~LOC~~ SOLN
30.0000 [IU] | Freq: Every day | SUBCUTANEOUS | Status: DC
  Filled 2014-10-07: qty 0.3

## 2014-10-07 MED ORDER — INSULIN ASPART 100 UNIT/ML ~~LOC~~ SOLN: 5.0000 [IU] | Freq: Once | SUBCUTANEOUS | Status: DC

## 2014-10-07 MED ORDER — METFORMIN HCL 500 MG PO TABS
1000.0000 mg | ORAL_TABLET | Freq: Two times a day (BID) | ORAL | Status: DC
  Administered 2014-10-07: 1000 mg via ORAL
  Filled 2014-10-07: qty 2

## 2014-10-07 MED ORDER — CANAGLIFLOZIN 100 MG PO TABS
300.0000 mg | ORAL_TABLET | Freq: Every day | ORAL | Status: DC
  Filled 2014-10-07 (×2): qty 3

## 2014-10-07 MED ORDER — DOXEPIN HCL 50 MG PO CAPS
50.0000 mg | ORAL_CAPSULE | Freq: Every day | ORAL | Status: DC
  Filled 2014-10-07: qty 1

## 2014-10-07 MED ORDER — ZOLPIDEM TARTRATE 5 MG PO TABS: 10.0000 mg | ORAL_TABLET | Freq: Every evening | ORAL | Status: DC | PRN

## 2014-10-07 MED ORDER — INSULIN ASPART 100 UNIT/ML ~~LOC~~ SOLN: 5.0000 [IU] | Freq: Three times a day (TID) | SUBCUTANEOUS | Status: DC

## 2014-10-07 MED ORDER — DIVALPROEX SODIUM 250 MG PO DR TAB: 1500.0000 mg | DELAYED_RELEASE_TABLET | Freq: Every day | ORAL | Status: DC

## 2014-10-07 MED ORDER — LEVETIRACETAM 500 MG PO TABS: 1500.0000 mg | ORAL_TABLET | Freq: Every day | ORAL | Status: DC

## 2014-10-07 MED ORDER — INSULIN ASPART 100 UNIT/ML ~~LOC~~ SOLN
10.0000 [IU] | Freq: Three times a day (TID) | SUBCUTANEOUS | Status: DC
  Administered 2014-10-07: 10 [IU] via SUBCUTANEOUS
  Filled 2014-10-07: qty 1

## 2014-10-07 MED ORDER — ATENOLOL 25 MG PO TABS
100.0000 mg | ORAL_TABLET | Freq: Every day | ORAL | Status: DC
  Administered 2014-10-07: 100 mg via ORAL
  Filled 2014-10-07: qty 4

## 2014-10-07 MED ORDER — AMLODIPINE BESYLATE 5 MG PO TABS
10.0000 mg | ORAL_TABLET | Freq: Every day | ORAL | Status: DC
  Administered 2014-10-07: 10 mg via ORAL
  Filled 2014-10-07: qty 2

## 2014-10-07 MED ORDER — LORAZEPAM 1 MG PO TABS: 1.0000 mg | ORAL_TABLET | Freq: Three times a day (TID) | ORAL | Status: DC | PRN

## 2014-10-07 MED ORDER — INSULIN ASPART 100 UNIT/ML ~~LOC~~ SOLN
10.0000 [IU] | Freq: Three times a day (TID) | SUBCUTANEOUS | Status: DC
  Filled 2014-10-07: qty 1

## 2014-10-07 MED ORDER — ONDANSETRON HCL 4 MG PO TABS
4.0000 mg | ORAL_TABLET | Freq: Three times a day (TID) | ORAL | Status: DC | PRN
Start: 2014-10-07 — End: 2014-10-07

## 2014-10-07 MED ORDER — ACETAMINOPHEN 325 MG PO TABS
650.0000 mg | ORAL_TABLET | ORAL | Status: DC | PRN
  Administered 2014-10-07: 650 mg via ORAL
  Filled 2014-10-07: qty 2

## 2014-10-07 MED ORDER — NICOTINE 21 MG/24HR TD PT24
21.0000 mg | MEDICATED_PATCH | Freq: Every day | TRANSDERMAL | Status: DC
  Filled 2014-10-07: qty 1

## 2014-10-07 NOTE — ED Notes (Signed)
Pt states that he takes 10 units of Novolog three times daily with meals and 5 units at bedtime.  Received verbal order from Rolland Porter to change to what pt says he takes at home.

## 2014-10-07 NOTE — ED Notes (Signed)
Pt c/o having auditory and visual hallucinations; pt states he woke up at 3am today and states he saw his twins, mom and best friend that have died; pt states he sees them and they talk to him; pt denies the voices telling him to harm himself or any one else.  Pt is tearful during assessment.

## 2014-10-07 NOTE — Discharge Instructions (Signed)
Go to your appointment today as planned and have them adjust her medications
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
## 2014-10-07 NOTE — ED Notes (Signed)
Pt is requesting to go home.  Informed that once Psyc speaks with Dr Tomi Bamberger, she will decide his next treatment.

## 2014-10-07 NOTE — ED Notes (Signed)
Patient in room pacing around stretcher.

## 2014-10-07 NOTE — ED Notes (Signed)
Pt at nurses station wanting to know if his discharge paper work was ready. Pt made aware he was not ready for discharge. He said it was taking to long. He had been here since 0500 this morning and he was leaving

## 2014-10-07 NOTE — ED Notes (Signed)
MD at bedside. 
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
## 2014-10-07 NOTE — ED Notes (Signed)
Telepsych performed.  

## 2014-10-07 NOTE — ED Notes (Signed)
Plan to give am medications once received from pharmacy.

## 2014-10-15 ENCOUNTER — Emergency Department (HOSPITAL_COMMUNITY)
Admission: EM | Admit: 2014-10-15 | Discharge: 2014-10-15 | Disposition: A | Discharge status: Home / Self Care | Payer: Medicaid Other | Attending: Emergency Medicine | Admitting: Emergency Medicine

## 2014-10-15 ENCOUNTER — Encounter (HOSPITAL_COMMUNITY): Payer: Self-pay | Admitting: *Deleted

## 2014-10-15 DIAGNOSIS — I10 Essential (primary) hypertension: Secondary | ICD-10-CM | POA: Diagnosis not present

## 2014-10-15 DIAGNOSIS — F419 Anxiety disorder, unspecified: Secondary | ICD-10-CM | POA: Insufficient documentation

## 2014-10-15 DIAGNOSIS — Z79899 Other long term (current) drug therapy: Secondary | ICD-10-CM | POA: Diagnosis not present

## 2014-10-15 DIAGNOSIS — Z720 Tobacco use: Secondary | ICD-10-CM | POA: Insufficient documentation

## 2014-10-15 DIAGNOSIS — R569 Unspecified convulsions: Secondary | ICD-10-CM | POA: Diagnosis present

## 2014-10-15 DIAGNOSIS — F209 Schizophrenia, unspecified: Secondary | ICD-10-CM | POA: Diagnosis not present

## 2014-10-15 DIAGNOSIS — E119 Type 2 diabetes mellitus without complications: Secondary | ICD-10-CM | POA: Insufficient documentation

## 2014-10-15 DIAGNOSIS — F319 Bipolar disorder, unspecified: Secondary | ICD-10-CM | POA: Insufficient documentation

## 2014-10-15 DIAGNOSIS — Z8669 Personal history of other diseases of the nervous system and sense organs: Secondary | ICD-10-CM | POA: Diagnosis not present

## 2014-10-15 DIAGNOSIS — G40909 Epilepsy, unspecified, not intractable, without status epilepticus: Secondary | ICD-10-CM | POA: Insufficient documentation

## 2014-10-15 DIAGNOSIS — Z794 Long term (current) use of insulin: Secondary | ICD-10-CM | POA: Insufficient documentation

## 2014-10-15 LAB — CBC WITH DIFFERENTIAL/PLATELET
BASOS ABS: 0 10*3/uL (ref 0.0–0.1)
Basophils Relative: 0 % (ref 0–1)
Eosinophils Absolute: 0.1 10*3/uL (ref 0.0–0.7)
Eosinophils Relative: 1 % (ref 0–5)
HCT: 45.3 % (ref 39.0–52.0)
Hemoglobin: 16.4 g/dL (ref 13.0–17.0)
LYMPHS PCT: 49 % — AB (ref 12–46)
Lymphs Abs: 2.4 10*3/uL (ref 0.7–4.0)
MCH: 32.2 pg (ref 26.0–34.0)
MCHC: 36.2 g/dL — ABNORMAL HIGH (ref 30.0–36.0)
MCV: 89 fL (ref 78.0–100.0)
MONO ABS: 0.4 10*3/uL (ref 0.1–1.0)
MONOS PCT: 8 % (ref 3–12)
Neutro Abs: 2.1 10*3/uL (ref 1.7–7.7)
Neutrophils Relative %: 42 % — ABNORMAL LOW (ref 43–77)
Platelets: 149 10*3/uL — ABNORMAL LOW (ref 150–400)
RBC: 5.09 MIL/uL (ref 4.22–5.81)
RDW: 12.2 % (ref 11.5–15.5)
WBC: 5 10*3/uL (ref 4.0–10.5)

## 2014-10-15 LAB — COMPREHENSIVE METABOLIC PANEL
ALBUMIN: 4.1 g/dL (ref 3.5–5.2)
ALK PHOS: 47 U/L (ref 39–117)
ALT: 13 U/L (ref 0–53)
ANION GAP: 11 (ref 5–15)
AST: 20 U/L (ref 0–37)
BILIRUBIN TOTAL: 0.6 mg/dL (ref 0.3–1.2)
BUN: 17 mg/dL (ref 6–23)
CHLORIDE: 104 mmol/L (ref 96–112)
CO2: 23 mmol/L (ref 19–32)
Calcium: 8.9 mg/dL (ref 8.4–10.5)
Creatinine, Ser: 1.07 mg/dL (ref 0.50–1.35)
GFR calc non Af Amer: 79 mL/min — ABNORMAL LOW (ref >90)
Glucose, Bld: 245 mg/dL — ABNORMAL HIGH (ref 70–99)
Potassium: 3.9 mmol/L (ref 3.5–5.1)
Sodium: 138 mmol/L (ref 135–145)
TOTAL PROTEIN: 6.7 g/dL (ref 6.0–8.3)

## 2014-10-15 LAB — URINALYSIS, ROUTINE W REFLEX MICROSCOPIC
Bilirubin Urine: NEGATIVE
GLUCOSE, UA: 500 mg/dL — AB
Ketones, ur: 15 mg/dL — AB
LEUKOCYTES UA: NEGATIVE
NITRITE: NEGATIVE
PH: 6 (ref 5.0–8.0)
Protein, ur: 30 mg/dL — AB
Urobilinogen, UA: 0.2 mg/dL (ref 0.0–1.0)

## 2014-10-15 LAB — RAPID URINE DRUG SCREEN, HOSP PERFORMED
AMPHETAMINES: NOT DETECTED
Barbiturates: NOT DETECTED
Benzodiazepines: POSITIVE — AB
Cocaine: NOT DETECTED
OPIATES: NOT DETECTED
Tetrahydrocannabinol: POSITIVE — AB

## 2014-10-15 LAB — URINE MICROSCOPIC-ADD ON

## 2014-10-15 LAB — TROPONIN I

## 2014-10-15 LAB — ETHANOL

## 2014-10-15 LAB — VALPROIC ACID LEVEL

## 2014-10-15 MED ORDER — VALPROATE SODIUM 500 MG/5ML IV SOLN
500.0000 mg | Freq: Once | INTRAVENOUS | Status: AC
  Administered 2014-10-15: 500 mg via INTRAVENOUS
  Filled 2014-10-15: qty 5

## 2014-10-15 MED ORDER — OXYCODONE-ACETAMINOPHEN 5-325 MG PO TABS
1.0000 | ORAL_TABLET | Freq: Once | ORAL | Status: AC
  Administered 2014-10-15: 1 via ORAL
  Filled 2014-10-15: qty 1

## 2014-10-15 MED ORDER — ACETAMINOPHEN 325 MG PO TABS
650.0000 mg | ORAL_TABLET | Freq: Once | ORAL | Status: AC
  Administered 2014-10-15: 650 mg via ORAL
  Filled 2014-10-15: qty 2

## 2014-10-15 NOTE — Discharge Instructions (Signed)

## 2014-10-15 NOTE — ED Notes (Signed)
Per EMS, Pt has two witnessed seizures, lasting ~2 min. Each,  earlier this morning, pt also admitted to drinking alcohol and smoking marijuana. Per EMS, crack cocaine was noticed in the trash can at residence but pt denies using any. Pt states he has been taking his depakote. EMS reports pt was initially post ictal and incontinent of urine. Pt was sitting, watching TV when seizure began. Pt is alert and oriented x 3 on arrival to ED.

## 2014-10-15 NOTE — ED Provider Notes (Signed)
CSN: [id number]     Arrival date & time 10/15/14  1015 History  This chart was scribed for Davonna Belling, MD by Einar Pheasant, Medical Scribe. This patient was seen in room [facility] and the patient's care was started at 10:34 AM.    Chief Complaint  Patient presents with  . Seizures    The history is provided by the patient, medical records and the EMS personnel.   HPI Comments: Melvin Taylor is a 49 y.o. male with PMhx of seizures is brought to the Emergency Department by EMS complaining of Seizures. Pt does not recall what happened all he remembers is sitting in his cough watching TV. He endorses feeling very weak, tired, and having a HA. Per EMS personnel, pt had 2 witnessed seizures lasting approximately 2 minutes earlier this morning.  He denies any alcohol use or drug use. Pt states that he has been taking his depakote as prescribed. Pt denies any fever, neck pain, sore throat, visual disturbance, CP, cough, SOB, abdominal pain, nausea, emesis, diarrhea, urinary symptoms, back pain, numbness, or rash as associated symptoms.     Past Medical History  Diagnosis Date  . Seizures   . Anxiety   . Depression   . Diabetes mellitus   . HTN (hypertension)   . Sleep apnea   . Bipolar 1 disorder   . Insomnia   . Headache(784.0)   . Schizophrenia    Past Surgical History  Procedure Laterality Date  . Mandible fracture surgery    . Knee surgery    . Tonsillectomy     Family History  Problem Relation Age of Onset  . Arthritis    . Diabetes    . Asthma     History  Substance Use Topics  . Smoking status: Current Every Day Smoker -- 1.00 packs/day for 30 years    Types: Cigarettes  . Smokeless tobacco: Never Used  . Alcohol Use: No    Review of Systems  Constitutional: Negative for fever and chills.  Respiratory: Negative for cough and shortness of breath.   Cardiovascular: Negative for chest pain.  Gastrointestinal: Negative for nausea, vomiting, abdominal pain  and diarrhea.  Musculoskeletal: Negative for back pain and neck pain.  Skin: Negative for rash.  Neurological: Positive for seizures, weakness and headaches.  All other systems reviewed and are negative.  Allergies  Aspirin  Home Medications   Prior to Admission medications   Medication Sig Start Date End Date Taking? Authorizing Provider  ALPRAZolam Duanne Moron) 1 MG tablet Take 1 tablet (1 mg total) by mouth 3 (three) times daily as needed for anxiety. Patient not taking: Reported on 06/29/2014 05/15/14   Ezequiel Essex, MD  amLODipine (NORVASC) 10 MG tablet Take 10 mg by mouth daily.     Historical Provider, MD  atenolol (TENORMIN) 100 MG tablet Take 100 mg by mouth daily.      Historical Provider, MD  canagliflozin (INVOKANA) 300 MG TABS tablet Take 300 mg by mouth daily before breakfast.    Historical Provider, MD  cyclobenzaprine (FLEXERIL) 10 MG tablet Take 1 tablet (10 mg total) by mouth 3 (three) times daily as needed for muscle spasms (and back pain). Patient not taking: Reported on 06/29/2014 05/06/14   Rolland Porter, MD  divalproex (DEPAKOTE) 500 MG EC tablet Take 1,500 mg by mouth at bedtime.     Historical Provider, MD  doxepin (SINEQUAN) 50 MG capsule Take 50 mg by mouth at bedtime.     Historical Provider, MD  hydrochlorothiazide (HYDRODIURIL) 25 MG tablet Take 25 mg by mouth daily.    Historical Provider, MD  HYDROcodone-acetaminophen (NORCO/VICODIN) 5-325 MG per tablet Take 1-2 tablets by mouth every 6 (six) hours as needed. Patient not taking: Reported on 09/03/2014 08/12/14   Glendell Docker, NP  insulin aspart (NOVOLOG FLEXPEN) 100 UNIT/ML FlexPen Inject 10 Units into the skin 3 (three) times daily with meals.    Historical Provider, MD  insulin aspart (NOVOLOG) 100 UNIT/ML injection Inject 5 Units into the skin 3 (three) times daily with meals. Patient not taking: Reported on 08/12/2014 07/01/14   Rosita Fire, MD  Insulin Glargine (LANTUS SOLOSTAR) 100 UNIT/ML Solostar Pen  Inject 30 Units into the skin daily at 10 pm.    Historical Provider, MD  insulin glargine (LANTUS) 100 UNIT/ML injection Inject 0.3 mLs (30 Units total) into the skin at bedtime. Patient not taking: Reported on 08/12/2014 07/01/14   Rosita Fire, MD  levETIRAcetam (KEPPRA) 500 MG tablet Take 1,500 mg by mouth at bedtime.     Historical Provider, MD  metFORMIN (GLUCOPHAGE) 1000 MG tablet Take 1,000 mg by mouth 2 (two) times daily.      Historical Provider, MD   BP 131/101 mmHg  Pulse 79  Temp(Src) 98.2 F (36.8 C) (Oral)  Resp 22  Ht 5\' 7"  (1.702 m)  Wt 160 lb (72.576 kg)  BMI 25.05 kg/m2  SpO2 99%  Physical Exam  Constitutional: He is oriented to person, place, and time. He appears well-developed and well-nourished. No distress.  HENT:  Head: Normocephalic and atraumatic.  Eyes: Conjunctivae are normal. Right eye exhibits no discharge. Left eye exhibits no discharge.  Neck: Neck supple.  Cardiovascular: Normal rate, regular rhythm and normal heart sounds.  Exam reveals no gallop and no friction rub.   No murmur heard. Pulmonary/Chest: Effort normal and breath sounds normal. No respiratory distress.  Abdominal: Soft. He exhibits no distension. There is no tenderness.  Musculoskeletal: He exhibits no edema or tenderness.  Neurological: He is alert and oriented to person, place, and time.  Skin: Skin is warm and dry.  Psychiatric: He has a normal mood and affect. His behavior is normal. Thought content normal.  Nursing note and vitals reviewed.   ED Course  Procedures (including critical care time)  10:40 AM- Pt advised of plan for treatment and pt agrees.  Labs Review Labs Reviewed - No data to display  Imaging Review No results found.   EKG Interpretation None      MDM   Final diagnoses:  None    Patient presents after a seizure. He is subtherapeutic on Dilantin and has been loaded. States he's been taking medication. There was question of cocaine and his house.  Urine drug screen is pending at time of discharge. Will discharge home to follow-up with his primary care doctor and his neurologist. Head CT done due to new headache.  I personally performed the services described in this documentation, which was scribed in my presence. The recorded information has been reviewed and is accurate.      Davonna Belling, MD 10/15/14 [phone]

## 2014-11-10 ENCOUNTER — Ambulatory Visit (INDEPENDENT_AMBULATORY_CARE_PROVIDER_SITE_OTHER): Payer: Medicaid Other | Admitting: Diagnostic Neuroimaging

## 2014-11-10 ENCOUNTER — Encounter: Payer: Self-pay | Admitting: Diagnostic Neuroimaging

## 2014-11-10 VITALS — BP 141/93 | HR 64 | Ht 67.0 in | Wt 155.2 lb

## 2014-11-10 DIAGNOSIS — G4700 Insomnia, unspecified: Secondary | ICD-10-CM

## 2014-11-10 DIAGNOSIS — G40219 Localization-related (focal) (partial) symptomatic epilepsy and epileptic syndromes with complex partial seizures, intractable, without status epilepticus: Secondary | ICD-10-CM | POA: Diagnosis not present

## 2014-11-10 DIAGNOSIS — F3170 Bipolar disorder, currently in remission, most recent episode unspecified: Secondary | ICD-10-CM | POA: Diagnosis not present

## 2014-11-10 DIAGNOSIS — G40109 Localization-related (focal) (partial) symptomatic epilepsy and epileptic syndromes with simple partial seizures, not intractable, without status epilepticus: Secondary | ICD-10-CM | POA: Insufficient documentation

## 2014-11-10 MED ORDER — DIVALPROEX SODIUM 500 MG PO DR TAB: 1000.0000 mg | DELAYED_RELEASE_TABLET | Freq: Two times a day (BID) | ORAL | Status: DC

## 2014-11-10 MED ORDER — DIVALPROEX SODIUM 500 MG PO DR TAB: 500.0000 mg | DELAYED_RELEASE_TABLET | Freq: Three times a day (TID) | ORAL | Status: DC

## 2014-11-10 MED ORDER — LEVETIRACETAM 500 MG PO TABS: 1000.0000 mg | ORAL_TABLET | Freq: Two times a day (BID) | ORAL | Status: DC

## 2014-11-10 NOTE — Patient Instructions (Signed)
Change levetiracetam/keppra to 1000mg  twice a day (2 of 500mg  tabs in AM; 2 of 500mg  tabs in PM).  Change divalproex/depakote to 1000mg  twice a day (2 of 500mg  tabs in AM; 2 of 500mg  tabs in PM).  Try melatonin over the counter supplement at night to help with insomnia.  Try sleep hygeine tips:  Avoid napping during the day. It can disturb the normal pattern of sleep and wakefulness.  Avoid stimulants such as caffeine, nicotine, and alcohol too close to bedtime. While alcohol is well known to speed the onset of sleep, it disrupts sleep in the second half as the body begins to metabolize the alcohol, causing arousal.  Exercise can promote good sleep. Vigorous exercise should be taken in the morning or late afternoon. A relaxing exercise, like yoga, can be done before bed to help initiate a restful night's sleep.  Food can be disruptive right before sleep.  Stay away from large meals close to bedtime. Also dietary changes can cause sleep problems, if someone is struggling with a sleep problem, it's not a good time to start experimenting with spicy dishes. And, remember, chocolate has caffeine.  Ensure adequate exposure to natural light. This is particularly important for older people who may not venture outside as frequently as children and adults. Light exposure helps maintain a healthy sleep-wake cycle.  Establish a regular relaxing bedtime routine. Try to avoid emotionally upsetting conversations and activities before trying to go to sleep. Don't dwell on, or bring your problems to bed.  Associate your bed with sleep. It's not a good idea to use your bed to watch TV, listen to the radio, or read.  Make sure that the sleep environment is pleasant and relaxing. The bed should be comfortable, the room should not be too hot or cold, or too bright.  Try sworkit app on your phone for daily stretching / exercise.
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
## 2014-11-13 ENCOUNTER — Telehealth: Payer: Self-pay | Admitting: Diagnostic Neuroimaging

## 2014-11-13 NOTE — Telephone Encounter (Signed)
I called the patient back to advise.  He verbalized understanding.

## 2014-11-13 NOTE — Telephone Encounter (Signed)
No. I am only managing seizure medications. -VRP

## 2014-11-13 NOTE — Telephone Encounter (Signed)
Patient is calling to get a new Rx called in for Soma 350mg . Patient states he takes medication 3 times a day and this medication was previously prescribed by another neurologist. Please call to [place] in [place]. Thank you.

## 2015-01-18 ENCOUNTER — Encounter (HOSPITAL_COMMUNITY): Payer: Self-pay | Admitting: *Deleted

## 2015-01-18 ENCOUNTER — Emergency Department (HOSPITAL_COMMUNITY)
Admission: EM | Admit: 2015-01-18 | Discharge: 2015-01-18 | Disposition: A | Discharge status: Home / Self Care | Payer: Medicaid Other | Attending: Emergency Medicine | Admitting: Emergency Medicine

## 2015-01-18 DIAGNOSIS — G40909 Epilepsy, unspecified, not intractable, without status epilepticus: Secondary | ICD-10-CM | POA: Diagnosis not present

## 2015-01-18 DIAGNOSIS — F319 Bipolar disorder, unspecified: Secondary | ICD-10-CM | POA: Insufficient documentation

## 2015-01-18 DIAGNOSIS — Z23 Encounter for immunization: Secondary | ICD-10-CM | POA: Diagnosis not present

## 2015-01-18 DIAGNOSIS — IMO0002 Reserved for concepts with insufficient information to code with codable children: Secondary | ICD-10-CM

## 2015-01-18 DIAGNOSIS — S51812A Laceration without foreign body of left forearm, initial encounter: Secondary | ICD-10-CM | POA: Insufficient documentation

## 2015-01-18 DIAGNOSIS — I10 Essential (primary) hypertension: Secondary | ICD-10-CM | POA: Diagnosis not present

## 2015-01-18 DIAGNOSIS — Y998 Other external cause status: Secondary | ICD-10-CM | POA: Diagnosis not present

## 2015-01-18 DIAGNOSIS — Z794 Long term (current) use of insulin: Secondary | ICD-10-CM | POA: Insufficient documentation

## 2015-01-18 DIAGNOSIS — E119 Type 2 diabetes mellitus without complications: Secondary | ICD-10-CM | POA: Insufficient documentation

## 2015-01-18 DIAGNOSIS — Y9389 Activity, other specified: Secondary | ICD-10-CM | POA: Diagnosis not present

## 2015-01-18 DIAGNOSIS — Y9289 Other specified places as the place of occurrence of the external cause: Secondary | ICD-10-CM | POA: Diagnosis not present

## 2015-01-18 DIAGNOSIS — Z79899 Other long term (current) drug therapy: Secondary | ICD-10-CM | POA: Diagnosis not present

## 2015-01-18 DIAGNOSIS — Z720 Tobacco use: Secondary | ICD-10-CM | POA: Insufficient documentation

## 2015-01-18 DIAGNOSIS — F419 Anxiety disorder, unspecified: Secondary | ICD-10-CM | POA: Diagnosis not present

## 2015-01-18 DIAGNOSIS — W260XXA Contact with knife, initial encounter: Secondary | ICD-10-CM | POA: Insufficient documentation

## 2015-01-18 LAB — CBG MONITORING, ED: Glucose-Capillary: 225 mg/dL — ABNORMAL HIGH (ref 65–99)

## 2015-01-18 MED ORDER — TETANUS-DIPHTH-ACELL PERTUSSIS 5-2.5-18.5 LF-MCG/0.5 IM SUSP
0.5000 mL | Freq: Once | INTRAMUSCULAR | Status: AC
  Administered 2015-01-18: 0.5 mL via INTRAMUSCULAR
  Filled 2015-01-18: qty 0.5

## 2015-01-18 MED ORDER — LIDOCAINE HCL (PF) 1 % IJ SOLN
INTRAMUSCULAR | Status: AC
  Administered 2015-01-18: 18:00:00
  Filled 2015-01-18: qty 5

## 2015-01-18 NOTE — ED Provider Notes (Signed)
CSN: [id number]     Arrival date & time 01/18/15  1632 History  This chart was scribed for non-physician practitioner, Evalee Jefferson, PA-C, working with Daleen Bo, MD, by Peyton Bottoms ED Scribe. This patient was seen in room  and the patient's care was started at 5:04 PM    Chief Complaint  Patient presents with  . Extremity Laceration   Patient is a 49 y.o. male presenting with skin laceration. The history is provided by the patient. No language interpreter was used.  Laceration Location:  Shoulder/arm Shoulder/arm laceration location:  L forearm Bleeding: controlled   Laceration mechanism:  Knife Pain details:    Quality:  Aching   Severity:  Moderate Foreign body present:  No foreign bodies Tetanus status:  Out of date   HPI Comments: Melvin Taylor is a 49 y.o. male with a PMHx of diabetes, hypertension, and knee surgery, who presents to the Emergency Department complaining of moderate laceration to the medial aspect of left wrist onset earlier today while patient was working on a refrigerator with a  boxcutter knife. He states he washed the area with hydrogen peroxide. No meds taken PTA. Pt has known allergies to aspirin. Pt is unsure of most recent tetanus shot. Patient also complains of moderate pain to right knee that began 4 days ago. He states he has been walking but denies pain to be associated with ambulation. He describes the pain to feel like pins and needles which radiates to his right foot. He denies any new activity or injury that may have caused pain.   Past Medical History  Diagnosis Date  . Seizures   . Anxiety   . Depression   . Diabetes mellitus   . HTN (hypertension)   . Sleep apnea   . Bipolar 1 disorder   . Insomnia   . Headache(784.0)   . Schizophrenia    Past Surgical History  Procedure Laterality Date  . Mandible fracture surgery    . Knee surgery    . Tonsillectomy     Family History  Problem Relation Age of Onset  . Arthritis    .  Diabetes    . Asthma     History  Substance Use Topics  . Smoking status: Current Every Day Smoker -- 1.00 packs/day for 30 years    Types: Cigarettes  . Smokeless tobacco: Never Used  . Alcohol Use: No   Review of Systems  Musculoskeletal: Positive for arthralgias.  Skin: Positive for wound (laceration).  Neurological: Positive for numbness (pins and needles to right foot).   Allergies  Aspirin  Home Medications   Prior to Admission medications   Medication Sig Start Date End Date Taking? Authorizing Provider  amLODipine (NORVASC) 10 MG tablet Take 10 mg by mouth daily.     Historical Provider, MD  atenolol (TENORMIN) 100 MG tablet Take 100 mg by mouth daily.      Historical Provider, MD  divalproex (DEPAKOTE) 500 MG DR tablet Take 2 tablets (1,000 mg total) by mouth 2 (two) times daily. 11/10/14   Penni Bombard, MD  hydrochlorothiazide (HYDRODIURIL) 25 MG tablet Take 25 mg by mouth daily.    Historical Provider, MD  ibuprofen (ADVIL,MOTRIN) 600 MG tablet Take 600 mg by mouth 3 (three) times daily as needed for mild pain.    Historical Provider, MD  insulin aspart (NOVOLOG FLEXPEN) 100 UNIT/ML FlexPen Inject 10 Units into the skin 3 (three) times daily with meals.    Historical Provider, MD  insulin aspart (NOVOLOG) 100 UNIT/ML injection Inject 5 Units into the skin 3 (three) times daily with meals. 07/01/14   Rosita Fire, MD  Insulin Glargine (LANTUS SOLOSTAR) 100 UNIT/ML Solostar Pen Inject 30 Units into the skin daily at 10 pm.    Historical Provider, MD  insulin glargine (LANTUS) 100 UNIT/ML injection Inject 0.3 mLs (30 Units total) into the skin at bedtime. 07/01/14   Rosita Fire, MD  levETIRAcetam (KEPPRA) 500 MG tablet Take 2 tablets (1,000 mg total) by mouth 2 (two) times daily. 11/10/14   Penni Bombard, MD   Triage Vitals: BP 134/93 mmHg  Pulse 87  Temp(Src) 98.1 F (36.7 C) (Oral)  Resp 20  Ht 5\' 7"  (1.702 m)  Wt 160 lb (72.576 kg)  BMI 25.05 kg/m2  SpO2  100%  Physical Exam  Constitutional: He appears well-developed and well-nourished. No distress.  HENT:  Head: Normocephalic.  Neck: Neck supple.  Cardiovascular: Normal rate.   Pulmonary/Chest: Effort normal. He has no wheezes.  Musculoskeletal: Normal range of motion. He exhibits tenderness. He exhibits no edema.  Tenderness along lateral tibial plateau. No edema. No medial or lateral ligament instability.  No crepitus with ROM. Dorsalis pedis pulse intact.   Skin: No rash noted. No erythema.  1 cm linear subcutaneus Laceration to left volar forearm. Laceration is hemostatic. Distal sensation is in tact. Full ROM of fingers and wrist.   ED Course  Procedures (including critical care time)  LACERATION REPAIR Performed by: Evalee Jefferson, PA-C,  Consent: Verbal consent obtained. Risks and benefits: risks, benefits and alternatives were discussed Patient identity confirmed: provided demographic data Time out performed prior to procedure Prepped and Draped in normal sterile fashion Wound explored Laceration Location: left volar forearm Laceration Length: 1 cm No Foreign Bodies seen or palpated Anesthesia: local infiltration Local anesthetic: lidocaine 1% without epinephrine Anesthetic total: 1.5 cc Irrigation method: syringe Amount of cleaning: standard Skin closure: Ethylon 4-0 Number of sutures or staples: 3 Technique: Simple interrupted Patient tolerance: Patient tolerated the procedure well with no immediate complications.  DIAGNOSTIC STUDIES: Oxygen Saturation is 100% on RA, normal by my interpretation.    COORDINATION OF CARE: 5:10 PM - Discussed option of ordering knee imaging but patient declined.  Pt advised of plan for treatment and pt agrees.  Labs Review Labs Reviewed  CBG MONITORING, ED - Abnormal; Notable for the following:    Glucose-Capillary 225 (*)    All other components within normal limits   Imaging Review No results found.   EKG  Interpretation None     MDM   Final diagnoses:  Laceration    Wound care instructions given.  Pt advised to have sutures removed in 10 days,  Return here sooner for any signs of infection including redness, swelling, worse pain or drainage of pus.  Pt's sx in his foot sound possibly related to neuropathy, possibly secondary to DM.  He states his cbg's are usually well controlled, was around 140 when checked today. He has good pulses in his foot, no wounds, skin intact.   I personally performed the services described in this documentation, which was scribed in my presence. The recorded information has been reviewed and is accurate.   Evalee Jefferson, PA-C 01/20/15 [id number]  Daleen Bo, MD 01/22/15 [phone]

## 2015-01-18 NOTE — Discharge Instructions (Signed)
Laceration Care, Adult °A laceration is a cut that goes through all layers of the skin. The cut goes into the tissue beneath the skin. °HOME CARE °For stitches (sutures) or staples: °· Keep the cut clean and dry. °· If you have a bandage (dressing), change it at least once a day. Change the bandage if it gets wet or dirty, or as told by your doctor. °· Wash the cut with soap and water 2 times a day. Rinse the cut with water. Pat it dry with a clean towel. °· Put a thin layer of medicated cream on the cut as told by your doctor. °· You may shower after the first 24 hours. Do not soak the cut in water until the stitches are removed. °· Only take medicines as told by your doctor. °· Have your stitches or staples removed as told by your doctor. °For skin adhesive strips: °· Keep the cut clean and dry. °· Do not get the strips wet. You may take a bath, but be careful to keep the cut dry. °· If the cut gets wet, pat it dry with a clean towel. °· The strips will fall off on their own. Do not remove the strips that are still stuck to the cut. °For wound glue: °· You may shower or take baths. Do not soak or scrub the cut. Do not swim. Avoid heavy sweating until the glue falls off on its own. After a shower or bath, pat the cut dry with a clean towel. °· Do not put medicine on your cut until the glue falls off. °· If you have a bandage, do not put tape over the glue. °· Avoid lots of sunlight or tanning lamps until the glue falls off. Put sunscreen on the cut for the first year to reduce your scar. °· The glue will fall off on its own. Do not pick at the glue. °You may need a tetanus shot if: °· You cannot remember when you had your last tetanus shot. °· You have never had a tetanus shot. °If you need a tetanus shot and you choose not to have one, you may get tetanus. Sickness from tetanus can be serious. °GET HELP RIGHT AWAY IF:  °· Your pain does not get better with medicine. °· Your arm, hand, leg, or foot loses feeling  (numbness) or changes color. °· Your cut is bleeding. °· Your joint feels weak, or you cannot use your joint. °· You have painful lumps on your body. °· Your cut is red, puffy (swollen), or painful. °· You have a red line on the skin near the cut. °· You have yellowish-white fluid (pus) coming from the cut. °· You have a fever. °· You have a bad smell coming from the cut or bandage. °· Your cut breaks open before or after stitches are removed. °· You notice something coming out of the cut, such as wood or glass. °· You cannot move a finger or toe. °MAKE SURE YOU:  °· Understand these instructions. °· Will watch your condition. °· Will get help right away if you are not doing well or get worse. °Document Released: 01/04/2008 Document Revised: 10/10/2011 Document Reviewed: 01/11/2011 °[employer]® Patient Information ©2015 [employer]. This information is not intended to replace advice given to you by your health care provider. Make sure you discuss any questions you have with your health care provider. ° °

## 2015-01-18 NOTE — ED Notes (Signed)
Pt had cut left forearm by accident with a boxcutter, also here for right knee pain for past 4-5 days

## 2015-02-11 ENCOUNTER — Ambulatory Visit (INDEPENDENT_AMBULATORY_CARE_PROVIDER_SITE_OTHER): Payer: Medicaid Other | Admitting: Diagnostic Neuroimaging

## 2015-02-11 ENCOUNTER — Encounter: Payer: Self-pay | Admitting: Diagnostic Neuroimaging

## 2015-02-11 VITALS — BP 148/92 | HR 70 | Ht 67.5 in | Wt 152.4 lb

## 2015-02-11 DIAGNOSIS — G40219 Localization-related (focal) (partial) symptomatic epilepsy and epileptic syndromes with complex partial seizures, intractable, without status epilepticus: Secondary | ICD-10-CM

## 2015-02-11 MED ORDER — LEVETIRACETAM 500 MG PO TABS: 1000.0000 mg | ORAL_TABLET | Freq: Two times a day (BID) | ORAL | Status: DC

## 2015-02-11 MED ORDER — DIVALPROEX SODIUM 500 MG PO DR TAB: 1000.0000 mg | DELAYED_RELEASE_TABLET | Freq: Two times a day (BID) | ORAL | Status: DC

## 2015-02-11 NOTE — Patient Instructions (Signed)
Continue current medications. 
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
## 2015-02-16 ENCOUNTER — Emergency Department (HOSPITAL_COMMUNITY)
Admission: EM | Admit: 2015-02-16 | Discharge: 2015-02-16 | Disposition: A | Discharge status: Home / Self Care | Payer: Medicaid Other | Attending: Emergency Medicine | Admitting: Emergency Medicine

## 2015-02-16 ENCOUNTER — Encounter (HOSPITAL_COMMUNITY): Payer: Self-pay

## 2015-02-16 DIAGNOSIS — F419 Anxiety disorder, unspecified: Secondary | ICD-10-CM | POA: Insufficient documentation

## 2015-02-16 DIAGNOSIS — E1165 Type 2 diabetes mellitus with hyperglycemia: Secondary | ICD-10-CM | POA: Diagnosis not present

## 2015-02-16 DIAGNOSIS — Z720 Tobacco use: Secondary | ICD-10-CM | POA: Insufficient documentation

## 2015-02-16 DIAGNOSIS — Z794 Long term (current) use of insulin: Secondary | ICD-10-CM | POA: Insufficient documentation

## 2015-02-16 DIAGNOSIS — G40909 Epilepsy, unspecified, not intractable, without status epilepticus: Secondary | ICD-10-CM | POA: Insufficient documentation

## 2015-02-16 DIAGNOSIS — R739 Hyperglycemia, unspecified: Secondary | ICD-10-CM

## 2015-02-16 DIAGNOSIS — Z79899 Other long term (current) drug therapy: Secondary | ICD-10-CM | POA: Insufficient documentation

## 2015-02-16 DIAGNOSIS — F319 Bipolar disorder, unspecified: Secondary | ICD-10-CM | POA: Diagnosis not present

## 2015-02-16 DIAGNOSIS — G4700 Insomnia, unspecified: Secondary | ICD-10-CM | POA: Insufficient documentation

## 2015-02-16 DIAGNOSIS — G629 Polyneuropathy, unspecified: Secondary | ICD-10-CM | POA: Diagnosis not present

## 2015-02-16 DIAGNOSIS — I10 Essential (primary) hypertension: Secondary | ICD-10-CM | POA: Insufficient documentation

## 2015-02-16 LAB — CBC WITH DIFFERENTIAL/PLATELET
Basophils Absolute: 0 10*3/uL (ref 0.0–0.1)
Basophils Relative: 0 % (ref 0–1)
EOS PCT: 1 % (ref 0–5)
Eosinophils Absolute: 0.1 10*3/uL (ref 0.0–0.7)
HCT: 40.3 % (ref 39.0–52.0)
HEMOGLOBIN: 14.4 g/dL (ref 13.0–17.0)
LYMPHS PCT: 52 % — AB (ref 12–46)
Lymphs Abs: 2.6 10*3/uL (ref 0.7–4.0)
MCH: 31.4 pg (ref 26.0–34.0)
MCHC: 35.7 g/dL (ref 30.0–36.0)
MCV: 88 fL (ref 78.0–100.0)
MONO ABS: 0.4 10*3/uL (ref 0.1–1.0)
Monocytes Relative: 8 % (ref 3–12)
NEUTROS ABS: 2 10*3/uL (ref 1.7–7.7)
Neutrophils Relative %: 39 % — ABNORMAL LOW (ref 43–77)
Platelets: 155 10*3/uL (ref 150–400)
RBC: 4.58 MIL/uL (ref 4.22–5.81)
RDW: 12.3 % (ref 11.5–15.5)
WBC: 5 10*3/uL (ref 4.0–10.5)

## 2015-02-16 LAB — BASIC METABOLIC PANEL WITH GFR
Anion gap: 8 (ref 5–15)
BUN: 8 mg/dL (ref 6–20)
CO2: 28 mmol/L (ref 22–32)
Calcium: 7.7 mg/dL — ABNORMAL LOW (ref 8.9–10.3)
Chloride: 98 mmol/L — ABNORMAL LOW (ref 101–111)
Creatinine, Ser: 0.95 mg/dL (ref 0.61–1.24)
GFR calc Af Amer: >60 mL/min (ref >60)
GFR calc non Af Amer: >60 mL/min (ref >60)
Glucose, Bld: 497 mg/dL — ABNORMAL HIGH (ref 65–99)
Potassium: 3.9 mmol/L (ref 3.5–5.1)
Sodium: 134 mmol/L — ABNORMAL LOW (ref 135–145)

## 2015-02-16 LAB — CBG MONITORING, ED
Glucose-Capillary: 533 mg/dL — ABNORMAL HIGH (ref 65–99)
Glucose-Capillary: 370 mg/dL — ABNORMAL HIGH (ref 65–99)

## 2015-02-16 MED ORDER — SODIUM CHLORIDE 0.9 % IV BOLUS (SEPSIS)
1000.0000 mL | Freq: Once | INTRAVENOUS | Status: AC
  Administered 2015-02-16: 1000 mL via INTRAVENOUS

## 2015-02-16 MED ORDER — INSULIN ASPART 100 UNIT/ML ~~LOC~~ SOLN
10.0000 [IU] | Freq: Once | SUBCUTANEOUS | Status: AC
  Administered 2015-02-16: 10 [IU] via SUBCUTANEOUS
  Filled 2015-02-16: qty 1

## 2015-02-16 MED ORDER — GABAPENTIN 300 MG PO CAPS: 300.0000 mg | ORAL_CAPSULE | Freq: Every day | ORAL | Status: DC

## 2015-02-16 NOTE — ED Notes (Signed)
Pt here for evaluation of high blood pressure. Also, states he is having pain in both legs that has been going on for about a month. States he has been taking OTC med's that are not helping

## 2015-02-16 NOTE — ED Notes (Signed)
Pt denies fever,nausea, vomiting or diarrhea. Pt states he has only had coffee to drink today

## 2015-02-16 NOTE — Discharge Instructions (Signed)
See Dr. Legrand Rams this week. Follow your appropriate diet, and use your sliding scale insulin for any sugars that are elevated. Neurontin, a new prescription, at night for your neuropathy pain.  Hyperglycemia Hyperglycemia occurs when the glucose (sugar) in your blood is too high. Hyperglycemia can happen for many reasons, but it most often happens to people who do not know they have diabetes or are not managing their diabetes properly.  CAUSES  Whether you have diabetes or not, there are other causes of hyperglycemia. Hyperglycemia can occur when you have diabetes, but it can also occur in other situations that you might not be as aware of, such as: Diabetes  If you have diabetes and are having problems controlling your blood glucose, hyperglycemia could occur because of some of the following reasons:  Not following your meal plan.  Not taking your diabetes medications or not taking it properly.  Exercising less or doing less activity than you normally do.  Being sick. Pre-diabetes  This cannot be ignored. Before people develop Type 2 diabetes, they almost always have "pre-diabetes." This is when your blood glucose levels are higher than normal, but not yet high enough to be diagnosed as diabetes. Research has shown that some long-term damage to the body, especially the heart and circulatory system, may already be occurring during pre-diabetes. If you take action to manage your blood glucose when you have pre-diabetes, you may delay or prevent Type 2 diabetes from developing. Stress  If you have diabetes, you may be "diet" controlled or on oral medications or insulin to control your diabetes. However, you may find that your blood glucose is higher than usual in the hospital whether you have diabetes or not. This is often referred to as "stress hyperglycemia." Stress can elevate your blood glucose. This happens because of hormones put out by the body during times of stress. If stress has been the  cause of your high blood glucose, it can be followed regularly by your caregiver. That way he/she can make sure your hyperglycemia does not continue to get worse or progress to diabetes. Steroids  Steroids are medications that act on the infection fighting system (immune system) to block inflammation or infection. One side effect can be a rise in blood glucose. Most people can produce enough extra insulin to allow for this rise, but for those who cannot, steroids make blood glucose levels go even higher. It is not unusual for steroid treatments to "uncover" diabetes that is developing. It is not always possible to determine if the hyperglycemia will go away after the steroids are stopped. A special blood test called an A1c is sometimes done to determine if your blood glucose was elevated before the steroids were started. SYMPTOMS  Thirsty.  Frequent urination.  Dry mouth.  Blurred vision.  Tired or fatigue.  Weakness.  Sleepy.  Tingling in feet or leg. DIAGNOSIS  Diagnosis is made by monitoring blood glucose in one or all of the following ways:  A1c test. This is a chemical found in your blood.  Fingerstick blood glucose monitoring.  Laboratory results. TREATMENT  First, knowing the cause of the hyperglycemia is important before the hyperglycemia can be treated. Treatment may include, but is not be limited to:  Education.  Change or adjustment in medications.  Change or adjustment in meal plan.  Treatment for an illness, infection, etc.  More frequent blood glucose monitoring.  Change in exercise plan.  Decreasing or stopping steroids.  Lifestyle changes. HOME CARE INSTRUCTIONS  Test your blood glucose as directed.  Exercise regularly. Your caregiver will give you instructions about exercise. Pre-diabetes or diabetes which comes on with stress is helped by exercising.  Eat wholesome, balanced meals. Eat often and at regular, fixed times. Your caregiver or  nutritionist will give you a meal plan to guide your sugar intake.  Being at an ideal weight is important. If needed, losing as little as 10 to 15 pounds may help improve blood glucose levels. SEEK MEDICAL CARE IF:   You have questions about medicine, activity, or diet.  You continue to have symptoms (problems such as increased thirst, urination, or weight gain). SEEK IMMEDIATE MEDICAL CARE IF:   You are vomiting or have diarrhea.  Your breath smells fruity.  You are breathing faster or slower.  You are very sleepy or incoherent.  You have numbness, tingling, or pain in your feet or hands.  You have chest pain.  Your symptoms get worse even though you have been following your caregiver's orders.  If you have any other questions or concerns. Document Released: 01/11/2001 Document Revised: 10/10/2011 Document Reviewed: 11/14/2011 [facility] Patient Information 2015 [place], [place]. This information is not intended to replace advice given to you by your health care provider. Make sure you discuss any questions you have with your health care provider.  Peripheral Neuropathy Peripheral neuropathy is a type of nerve damage. It affects nerves that carry signals between the spinal cord and other parts of the body. These are called peripheral nerves. With peripheral neuropathy, one nerve or a group of nerves may be damaged.  CAUSES  Many things can damage peripheral nerves. For some people with peripheral neuropathy, the cause is unknown. Some causes include:  Diabetes. This is the most common cause of peripheral neuropathy.  Injury to a nerve.  Pressure or stress on a nerve that lasts a long time.  Too little vitamin B. Alcoholism can lead to this.  Infections.  Autoimmune diseases, such as multiple sclerosis and systemic lupus erythematosus.  Inherited nerve diseases.  Some medicines, such as cancer drugs.  Toxic substances, such as lead and mercury.  Too little blood  flowing to the legs.  Kidney disease.  Thyroid disease. SIGNS AND SYMPTOMS  Different people have different symptoms. The symptoms you have will depend on which of your nerves is damaged. Common symptoms include:  Loss of feeling (numbness) in the feet and hands.  Tingling in the feet and hands.  Pain that burns.  Very sensitive skin.  Weakness.  Not being able to move a part of the body (paralysis).  Muscle twitching.  Clumsiness or poor coordination.  Loss of balance.  Not being able to control your bladder.  Feeling dizzy.  Sexual problems. DIAGNOSIS  Peripheral neuropathy is a symptom, not a disease. Finding the cause of peripheral neuropathy can be hard. To figure that out, your health care provider will take a medical history and do a physical exam. A neurological exam will also be done. This involves checking things affected by your brain, spinal cord, and nerves (nervous system). For example, your health care provider will check your reflexes, how you move, and what you can feel.  Other types of tests may also be ordered, such as:  Blood tests.  A test of the fluid in your spinal cord.  Imaging tests, such as CT scans or an MRI.  Electromyography (EMG). This test checks the nerves that control muscles.  Nerve conduction velocity tests. These tests check how fast messages  pass through your nerves.  Nerve biopsy. A small piece of nerve is removed. It is then checked under a microscope. TREATMENT   Medicine is often used to treat peripheral neuropathy. Medicines may include:  Pain-relieving medicines. Prescription or over-the-counter medicine may be suggested.  Antiseizure medicine. This may be used for pain.  Antidepressants. These also may help ease pain from neuropathy.  Lidocaine. This is a numbing medicine. You might wear a patch or be given a shot.  Mexiletine. This medicine is typically used to help control irregular heart rhythms.  Surgery.  Surgery may be needed to relieve pressure on a nerve or to destroy a nerve that is causing pain.  Physical therapy to help movement.  Assistive devices to help movement. HOME CARE INSTRUCTIONS   Only take over-the-counter or prescription medicines as directed by your health care provider. Follow the instructions carefully for any given medicines. Do not take any other medicines without first getting approval from your health care provider.  If you have diabetes, work closely with your health care provider to keep your blood sugar under control.  If you have numbness in your feet:  Check every day for signs of injury or infection. Watch for redness, warmth, and swelling.  Wear padded socks and comfortable shoes. These help protect your feet.  Do not do things that put pressure on your damaged nerve.  Do not smoke. Smoking keeps blood from getting to damaged nerves.  Avoid or limit alcohol. Too much alcohol can cause a lack of B vitamins. These vitamins are needed for healthy nerves.  Develop a good support system. Coping with peripheral neuropathy can be stressful. Talk to a mental health specialist or join a support group if you are struggling.  Follow up with your health care provider as directed. SEEK MEDICAL CARE IF:   You have new signs or symptoms of peripheral neuropathy.  You are struggling emotionally from dealing with peripheral neuropathy.  You have a fever. SEEK IMMEDIATE MEDICAL CARE IF:   You have an injury or infection that is not healing.  You feel very dizzy or begin vomiting.  You have chest pain.  You have trouble breathing. Document Released: 07/08/2002 Document Revised: 03/30/2011 Document Reviewed: 03/25/2013 [facility] Patient Information 2015 [place], [place]. This information is not intended to replace advice given to you by your health care provider. Make sure you discuss any questions you have with your health care provider.

## 2015-02-16 NOTE — ED Provider Notes (Addendum)
CSN: [id number]     Arrival date & time 02/16/15  1537 History   First MD Initiated Contact with Patient 02/16/15 1544     Chief Complaint  Patient presents with  . Hyperglycemia     HPI  Patient presents for evaluation of high blood sugars and leg pain. All history diabetes. Is been on insulin for over a year. Her last several months as complained that he has pain in both legs from his knees down. No fall or injury. States blood sugars normally "2-3". However blood sugar upon arrival here is 533. He states "it wasn't that high at home".  Past Medical History  Diagnosis Date  . Anxiety   . Depression   . Diabetes mellitus   . HTN (hypertension)   . Sleep apnea   . Bipolar 1 disorder   . Insomnia   . Headache(784.0)   . Schizophrenia   . Hallucinations     "long history of them"  . Seizures     last sz between July 5-9th, 2016   Past Surgical History  Procedure Laterality Date  . Mandible fracture surgery    . Knee surgery    . Tonsillectomy     Family History  Problem Relation Age of Onset  . Arthritis    . Diabetes    . Asthma    . Diabetes Father   . Hypertension Father    History  Substance Use Topics  . Smoking status: Current Every Day Smoker -- 1.00 packs/day for 30 years    Types: Cigarettes  . Smokeless tobacco: Never Used  . Alcohol Use: Yes     Comment: 02/11/15 "drink a beer every now and then"    Review of Systems  Constitutional: Negative for fever, chills, diaphoresis, appetite change and fatigue.  HENT: Negative for mouth sores, sore throat and trouble swallowing.   Eyes: Negative for visual disturbance.  Respiratory: Negative for cough, chest tightness, shortness of breath and wheezing.   Cardiovascular: Negative for chest pain.  Gastrointestinal: Negative for nausea, vomiting, abdominal pain, diarrhea and abdominal distention.  Endocrine: Positive for polyuria. Negative for polydipsia and polyphagia.  Genitourinary: Negative for dysuria,  frequency and hematuria.  Musculoskeletal: Negative for gait problem.       Lower extremity pain. Primary from the knees and inferiorly. No thigh pain. No muscular pain. States it feels like a burning and keeps him up at night.  Skin: Negative for color change, pallor and rash.  Neurological: Negative for dizziness, syncope, light-headedness and headaches.  Hematological: Does not bruise/bleed easily.  Psychiatric/Behavioral: Negative for behavioral problems and confusion.      Allergies  Aspirin  Home Medications   Prior to Admission medications   Medication Sig Start Date End Date Taking? Authorizing Provider  amLODipine (NORVASC) 10 MG tablet Take 10 mg by mouth daily.    Yes Historical Provider, MD  atenolol (TENORMIN) 100 MG tablet Take 100 mg by mouth daily.     Yes Historical Provider, MD  divalproex (DEPAKOTE) 500 MG DR tablet Take 2 tablets (1,000 mg total) by mouth 2 (two) times daily. 02/11/15  Yes Penni Bombard, MD  hydrochlorothiazide (HYDRODIURIL) 25 MG tablet Take 25 mg by mouth daily.   Yes Historical Provider, MD  ibuprofen (ADVIL,MOTRIN) 600 MG tablet Take 600 mg by mouth 3 (three) times daily as needed for mild pain.   Yes Historical Provider, MD  insulin aspart (NOVOLOG FLEXPEN) 100 UNIT/ML FlexPen Inject 10 Units into the skin 3 (three)  times daily with meals.   Yes Historical Provider, MD  Insulin Glargine (LANTUS SOLOSTAR) 100 UNIT/ML Solostar Pen Inject 10 Units into the skin daily at 10 pm.    Yes Historical Provider, MD  levETIRAcetam (KEPPRA) 500 MG tablet Take 2 tablets (1,000 mg total) by mouth 2 (two) times daily. 02/11/15  Yes Penni Bombard, MD  gabapentin (NEURONTIN) 300 MG capsule Take 1 capsule (300 mg total) by mouth at bedtime. 02/16/15   Tanna Furry, MD  insulin aspart (NOVOLOG) 100 UNIT/ML injection Inject 5 Units into the skin 3 (three) times daily with meals. 07/01/14   Tesfaye Fanta, MD   BP 164/103 mmHg  Pulse 56  Temp(Src) 98.5 F (36.9  C) (Oral)  Resp 18  Ht 5\' 7"  (1.702 m)  Wt 150 lb (68.04 kg)  BMI 23.49 kg/m2  SpO2 100% Physical Exam  Constitutional: He is oriented to person, place, and time. He appears well-developed and well-nourished. No distress.  HENT:  Head: Normocephalic.  Eyes: Conjunctivae are normal. Pupils are equal, round, and reactive to light. No scleral icterus.  Neck: Normal range of motion. Neck supple. No thyromegaly present.  Cardiovascular: Normal rate and regular rhythm.  Exam reveals no gallop and no friction rub.   No murmur heard. Pulmonary/Chest: Effort normal and breath sounds normal. No respiratory distress. He has no wheezes. He has no rales.  Abdominal: Soft. Bowel sounds are normal. He exhibits no distension. There is no tenderness. There is no rebound.  Musculoskeletal: Normal range of motion.  Indicates pain from his knees down in both legs. Soft compartments. Normal appearing skin. Strong pulses and capillary refill. Normal sensation and movement.  Neurological: He is alert and oriented to person, place, and time.  Skin: Skin is warm and dry. No rash noted.  Psychiatric: He has a normal mood and affect. His behavior is normal.    ED Course  Procedures (including critical care time) Labs Review Labs Reviewed  CBC WITH DIFFERENTIAL/PLATELET - Abnormal; Notable for the following:    Neutrophils Relative % 39 (*)    Lymphocytes Relative 52 (*)    All other components within normal limits  BASIC METABOLIC PANEL - Abnormal; Notable for the following:    Sodium 134 (*)    Chloride 98 (*)    Glucose, Bld 497 (*)    Calcium 7.7 (*)    All other components within normal limits  CBG MONITORING, ED - Abnormal; Notable for the following:    Glucose-Capillary 533 (*)    All other components within normal limits  CBG MONITORING, ED - Abnormal; Notable for the following:    Glucose-Capillary 370 (*)    All other components within normal limits    Imaging Review No results  found.   EKG Interpretation None      MDM   Final diagnoses:  Hyperglycemia  Peripheral neuropathy    Patient has a normal exam to his lower legs. Symptoms are consistent with probable neuropathy. Fingerstick blood sugar 533. Lab glucose 497. Given insulin and fluids. Recheck 370. Not acidotic. Asymptomatic. We discussed more aggressively using his sliding scale. Primary care follow-up. Neurontin prescription for neuropathy.    Tanna Furry, MD 02/16/15 2107  Tanna Furry, MD 02/16/15 2108

## 2015-03-20 ENCOUNTER — Telehealth: Payer: Self-pay

## 2015-03-20 NOTE — Telephone Encounter (Signed)
Patient called wanting to be set up for his colonoscopy. Please call [phone]

## 2015-03-21 ENCOUNTER — Encounter (HOSPITAL_COMMUNITY): Payer: Self-pay | Admitting: Emergency Medicine

## 2015-03-21 ENCOUNTER — Emergency Department (HOSPITAL_COMMUNITY)
Admission: EM | Admit: 2015-03-21 | Discharge: 2015-03-21 | Disposition: A | Discharge status: Home / Self Care | Payer: Medicaid Other | Attending: Emergency Medicine | Admitting: Emergency Medicine

## 2015-03-21 DIAGNOSIS — Z720 Tobacco use: Secondary | ICD-10-CM | POA: Insufficient documentation

## 2015-03-21 DIAGNOSIS — G43909 Migraine, unspecified, not intractable, without status migrainosus: Secondary | ICD-10-CM | POA: Diagnosis not present

## 2015-03-21 DIAGNOSIS — F419 Anxiety disorder, unspecified: Secondary | ICD-10-CM | POA: Insufficient documentation

## 2015-03-21 DIAGNOSIS — M545 Low back pain: Secondary | ICD-10-CM | POA: Diagnosis not present

## 2015-03-21 DIAGNOSIS — Z79899 Other long term (current) drug therapy: Secondary | ICD-10-CM | POA: Insufficient documentation

## 2015-03-21 DIAGNOSIS — E1140 Type 2 diabetes mellitus with diabetic neuropathy, unspecified: Secondary | ICD-10-CM | POA: Insufficient documentation

## 2015-03-21 DIAGNOSIS — M79604 Pain in right leg: Secondary | ICD-10-CM | POA: Diagnosis present

## 2015-03-21 DIAGNOSIS — F329 Major depressive disorder, single episode, unspecified: Secondary | ICD-10-CM | POA: Diagnosis not present

## 2015-03-21 DIAGNOSIS — G629 Polyneuropathy, unspecified: Secondary | ICD-10-CM

## 2015-03-21 DIAGNOSIS — I10 Essential (primary) hypertension: Secondary | ICD-10-CM | POA: Insufficient documentation

## 2015-03-21 DIAGNOSIS — Z794 Long term (current) use of insulin: Secondary | ICD-10-CM | POA: Diagnosis not present

## 2015-03-21 MED ORDER — TRAMADOL HCL 50 MG PO TABS: 50.0000 mg | ORAL_TABLET | Freq: Four times a day (QID) | ORAL | Status: DC | PRN

## 2015-03-21 NOTE — Discharge Instructions (Signed)
Diabetic Neuropathy Diabetic neuropathy is a nerve disease or nerve damage that is caused by diabetes mellitus. About half of all people with diabetes mellitus have some form of nerve damage. Nerve damage is more common in those who have had diabetes mellitus for many years and who generally have not had good control of their blood sugar (glucose) level. Diabetic neuropathy is a common complication of diabetes mellitus. There are three more common types of diabetic neuropathy and a fourth type that is less common and less understood:   Peripheral neuropathy--This is the most common type of diabetic neuropathy. It causes damage to the nerves of the feet and legs first and then eventually the hands and arms.The damage affects the ability to sense touch.  Autonomic neuropathy--This type causes damage to the autonomic nervous system, which controls the following functions:  Heartbeat.  Body temperature.  Blood pressure.  Urination.  Digestion.  Sweating.  Sexual function.  Focal neuropathy--Focal neuropathy can be painful and unpredictable and occurs most often in older adults with diabetes mellitus. It involves a specific nerve or one area and often comes on suddenly. It usually does not cause long-term problems.  Radiculoplexus neuropathy-- Sometimes called lumbosacral radiculoplexus neuropathy, radiculoplexus neuropathy affects the nerves of the thighs, hips, buttocks, or legs. It is more common in people with type 2 diabetes mellitus and in older men. It is characterized by debilitating pain, weakness, and atrophy, usually in the thigh muscles. CAUSES  The cause of peripheral, autonomic, and focal neuropathies is diabetes mellitus that is uncontrolled and high glucose levels. The cause of radiculoplexus neuropathy is unknown. However, it is thought to be caused by inflammation related to uncontrolled glucose levels. SIGNS AND SYMPTOMS  Peripheral Neuropathy Peripheral neuropathy develops  slowly over time. When the nerves of the feet and legs no longer work there may be:   Burning, stabbing, or aching pain in the legs or feet.  Inability to feel pressure or pain in your feet. This can lead to:  Thick calluses over pressure areas.  Pressure sores.  Ulcers.  Foot deformities.  Reduced ability to feel temperature changes.  Muscle weakness. Autonomic Neuropathy The symptoms of autonomic neuropathy vary depending on which nerves are affected. Symptoms may include:  Problems with digestion, such as:  Feeling sick to your stomach (nausea).  Vomiting.  Bloating.  Constipation.  Diarrhea.  Abdominal pain.  Difficulty with urination. This occurs if you lose your ability to sense when your bladder is full. Problems include:  Urine leakage (incontinence).  Inability to empty your bladder completely (retention).  Rapid or irregular heartbeat (palpitations).  Blood pressure drops when you stand up (orthostatic hypotension). When you stand up you may feel:  Dizzy.  Weak.  Faint.  In men, inability to attain and maintain an erection.  In women, vaginal dryness and problems with decreased sexual desire and arousal.  Problems with body temperature regulation.  Increased or decreased sweating. Focal Neuropathy  Abnormal eye movements or abnormal alignment of both eyes.  Weakness in the wrist.  Foot drop. This results in an inability to lift the foot properly and abnormal walking or foot movement.  Paralysis on one side of your face (Bell palsy).  Chest or abdominal pain. Radiculoplexus Neuropathy  Sudden, severe pain in your hip, thigh, or buttocks.  Weakness and wasting of thigh muscles.  Difficulty rising from a seated position.  Abdominal swelling.  Unexplained weight loss (usually more than 10 lb [4.5 kg]). DIAGNOSIS  Peripheral Neuropathy Your senses may   be tested. Sensory function testing can be done with:  A light touch using a  monofilament.  A vibration with tuning fork.  A sharp sensation with a pin prick. Other tests that can help diagnose neuropathy are:  Nerve conduction velocity. This test checks the transmission of an electrical current through a nerve.  Electromyography. This shows how muscles respond to electrical signals transmitted by nearby nerves.  Quantitative sensory testing. This is used to assess how your nerves respond to vibrations and changes in temperature. Autonomic Neuropathy Diagnosis is often based on reported symptoms. Tell your health care provider if you experience:   Dizziness.   Constipation.   Diarrhea.   Inappropriate urination or inability to urinate.   Inability to get or maintain an erection.  Tests that may be done include:   Electrocardiography or Holter monitor. These are tests that can help show problems with the heart rate or heart rhythm.   An X-ray exam may be done. Focal Neuropathy Diagnosis is made based on your symptoms and what your health care provider finds during your exam. Other tests may be done. They may include:  Nerve conduction velocities. This checks the transmission of electrical current through a nerve.  Electromyography. This shows how muscles respond to electrical signals transmitted by nearby nerves.  Quantitative sensory testing. This test is used to assess how your nerves respond to vibration and changes in temperature. Radiculoplexus Neuropathy  Often the first thing is to eliminate any other issue or problems that might be the cause, as there is no stick test for diagnosis.  X-ray exam of your spine and lumbar region.  Spinal tap to rule out cancer.  MRI to rule out other lesions. TREATMENT  Once nerve damage occurs, it cannot be reversed. The goal of treatment is to keep the disease or nerve damage from getting worse and affecting more nerve fibers. Controlling your blood glucose level is the key. Most people with  radiculoplexus neuropathy see at least a partial improvement over time. You will need to keep your blood glucose and HbA1c levels in the target range determined by your health care provider. Things that help control blood glucose levels include:   Blood glucose monitoring.   Meal planning.   Physical activity.   Diabetes medicine.  Over time, maintaining lower blood glucose levels helps lessen symptoms. Sometimes, prescription pain medicine is needed. HOME CARE INSTRUCTIONS:  Do not smoke.  Keep your blood glucose level in the range that you and your health care provider have determined acceptable for you.  Keep your blood pressure level in the range that you and your health care provider have determined acceptable for you.  Eat a well-balanced diet.  Be active every day.  Check your feet every day. SEEK MEDICAL CARE IF:   You have burning, stabbing, or aching pain in the legs or feet.  You are unable to feel pressure or pain in your feet.  You develop problems with digestion such as:  Nausea.  Vomiting.  Bloating.  Constipation.  Diarrhea.  Abdominal pain.  You have difficulty with urination, such as:  Incontinence.  Retention.  You have palpitations.  You develop orthostatic hypotension. When you stand up you may feel:  Dizzy.  Weak.  Faint.  You cannot attain and maintain an erection (in men).  You have vaginal dryness and problems with decreased sexual desire and arousal (in women).  You have severe pain in your thighs, legs, or buttocks.  You have unexplained weight loss.   Document Released: 09/26/2001 Document Revised: 05/08/2013 Document Reviewed: 12/27/2012 [employer] Patient Information 2015 [employer]. This information is not intended to replace advice given to you by your health care provider. Make sure you discuss any questions you have with your health care provider. 

## 2015-03-21 NOTE — ED Notes (Addendum)
Patient states that his legs have been bothering him since last night. Patient states that his "legs gave out on him today."

## 2015-03-23 ENCOUNTER — Telehealth: Payer: Self-pay

## 2015-03-23 NOTE — ED Provider Notes (Signed)
CSN: [id number]     Arrival date & time 03/21/15  1248 History   First MD Initiated Contact with Patient 03/21/15 1358     Chief Complaint  Patient presents with  . Leg Pain     (Consider location/radiation/quality/duration/timing/severity/associated sxs/prior Treatment) HPI   Melvin Taylor is a 49 y.o. male who presents to the Emergency Department complaining of recurrent bilateral knee pain and right leg and back pain.  He describes pain to his legs that cause his "legs to give out" with weight bearing.  He describes sharp pain to his right lower back that radiates into his leg just below the knee.  He reports a hx of diabetes and "nerve damage" to his legs secondary to the diabetes.  He takes Neurontin without relief.  He states that he recently saw his PMD who increased his neuron tin dose but patient states this has not helped his pain.  He denies swelling, fever, numbness or weakness of the extremities, dysuria and abdominal pain,.      Past Medical History  Diagnosis Date  . Anxiety   . Depression   . Diabetes mellitus   . HTN (hypertension)   . Sleep apnea   . Bipolar 1 disorder   . Insomnia   . Headache(784.0)   . Schizophrenia   . Hallucinations     "long history of them"  . Seizures     last sz between July 5-9th, 2016   Past Surgical History  Procedure Laterality Date  . Mandible fracture surgery    . Knee surgery    . Tonsillectomy     Family History  Problem Relation Age of Onset  . Arthritis    . Diabetes    . Asthma    . Diabetes Father   . Hypertension Father    Social History  Substance Use Topics  . Smoking status: Current Every Day Smoker -- 1.00 packs/day for 30 years    Types: Cigarettes  . Smokeless tobacco: Never Used  . Alcohol Use: Yes     Comment: 02/11/15 "drink a beer every now and then"    Review of Systems  Constitutional: Negative for fever.  Respiratory: Negative for shortness of breath.   Gastrointestinal: Negative for  vomiting, abdominal pain and constipation.  Genitourinary: Negative for dysuria, hematuria, flank pain, decreased urine volume and difficulty urinating.  Musculoskeletal: Positive for back pain and arthralgias. Negative for joint swelling.  Skin: Negative for rash.  Neurological: Negative for weakness and numbness.  All other systems reviewed and are negative.     Allergies  Aspirin  Home Medications   Prior to Admission medications   Medication Sig Start Date End Date Taking? Authorizing Provider  amLODipine (NORVASC) 10 MG tablet Take 10 mg by mouth daily.     Historical Provider, MD  atenolol (TENORMIN) 100 MG tablet Take 100 mg by mouth daily.      Historical Provider, MD  divalproex (DEPAKOTE) 500 MG DR tablet Take 2 tablets (1,000 mg total) by mouth 2 (two) times daily. 02/11/15   Penni Bombard, MD  gabapentin (NEURONTIN) 300 MG capsule Take 1 capsule (300 mg total) by mouth at bedtime. 02/16/15   Tanna Furry, MD  hydrochlorothiazide (HYDRODIURIL) 25 MG tablet Take 25 mg by mouth daily.    Historical Provider, MD  ibuprofen (ADVIL,MOTRIN) 600 MG tablet Take 600 mg by mouth 3 (three) times daily as needed for mild pain.    Historical Provider, MD  insulin aspart (NOVOLOG FLEXPEN)  100 UNIT/ML FlexPen Inject 10 Units into the skin 3 (three) times daily with meals.    Historical Provider, MD  insulin aspart (NOVOLOG) 100 UNIT/ML injection Inject 5 Units into the skin 3 (three) times daily with meals. 07/01/14   Rosita Fire, MD  Insulin Glargine (LANTUS SOLOSTAR) 100 UNIT/ML Solostar Pen Inject 10 Units into the skin daily at 10 pm.     Historical Provider, MD  levETIRAcetam (KEPPRA) 500 MG tablet Take 2 tablets (1,000 mg total) by mouth 2 (two) times daily. 02/11/15   Penni Bombard, MD  traMADol (ULTRAM) 50 MG tablet Take 1 tablet (50 mg total) by mouth every 6 (six) hours as needed. 03/21/15   Chaquana Nichols, PA-C   BP 149/94 mmHg  Pulse 61  Temp(Src) 98.4 F (36.9 C) (Oral)   Resp 16  Ht 5\' 7"  (1.702 m)  Wt 145 lb (65.772 kg)  BMI 22.71 kg/m2  SpO2 100% Physical Exam  Constitutional: He is oriented to person, place, and time. He appears well-developed and well-nourished. No distress.  HENT:  Head: Normocephalic and atraumatic.  Neck: Normal range of motion. Neck supple.  Cardiovascular: Normal rate, regular rhythm, normal heart sounds and intact distal pulses.   No murmur heard. Pulmonary/Chest: Effort normal and breath sounds normal. No respiratory distress.  Abdominal: Soft. He exhibits no distension. There is no tenderness.  Musculoskeletal: He exhibits tenderness. He exhibits no edema.       Lumbar back: He exhibits tenderness and pain. He exhibits normal range of motion, no swelling, no deformity, no laceration and normal pulse.  ttp of the right lumbar paraspinal muscles.  No spinal tenderness.  DP pulses are brisk and symmetrical.  Distal sensation intact.  Hip Flexors/Extensors are intact.  Pt has 5/5 strength against resistance of bilateral lower extremities.  Full ROM of bilateral knees.  No edema   Neurological: He is alert and oriented to person, place, and time. He has normal strength. No sensory deficit. He exhibits normal muscle tone. Coordination and gait normal.  Reflex Scores:      Patellar reflexes are 2+ on the right side and 2+ on the left side.      Achilles reflexes are 2+ on the right side and 2+ on the left side. Skin: Skin is warm and dry. No rash noted.  Nursing note and vitals reviewed.   ED Course  Procedures (including critical care time) Labs Review Labs Reviewed - No data to display  Imaging Review   EKG Interpretation None      MDM   Final diagnoses:  Neuropathy   Pt is well appearing, non-toxic.  No focal neuro deficits, no concerning sx's for emergent neurological or infectious process.  Advised to f/u with PMD.  Appears stable for d/c     Kem Parkinson, PA-C 03/23/15 2247  [name],  MD 03/25/15 [phone]

## 2015-03-23 NOTE — Telephone Encounter (Signed)
[phone]  PATIENT CALLED TO SCHEDULE TCS  RECEIVED LETTER

## 2015-03-26 NOTE — Telephone Encounter (Signed)
See separate triage.  

## 2015-03-26 NOTE — Telephone Encounter (Signed)
I have talked to the pt. Reviewed his med list, The list he gave me is different than the list from PCP. I got list from his pharmacy and it only had 3 meds and did not include any of the insulin. Per Walden Field, NP, I have scheduled an OV with Randall Hiss on 04/16/2015 at 1:30 PM. I spoke to the pt and informed him and he wanted a letter to remind him of the appointment. I am mailing him the letter with a note to bring all of his meds with him to the OV, including OTC meds, insulin and the prescription meds.

## 2015-03-26 NOTE — Telephone Encounter (Signed)
I called pt on 03/25/2015 while the computers were down and got some of his triage info.  I will need to call and give appt date and time for procedure and review meds on the med list that he did not mention.  This will be his first colonoscopy.

## 2015-04-08 ENCOUNTER — Emergency Department (HOSPITAL_COMMUNITY)
Admission: EM | Admit: 2015-04-08 | Discharge: 2015-04-08 | Disposition: A | Discharge status: Home / Self Care | Payer: Medicaid Other | Attending: Emergency Medicine | Admitting: Emergency Medicine

## 2015-04-08 ENCOUNTER — Encounter (HOSPITAL_COMMUNITY): Payer: Self-pay | Admitting: *Deleted

## 2015-04-08 DIAGNOSIS — G43909 Migraine, unspecified, not intractable, without status migrainosus: Secondary | ICD-10-CM | POA: Diagnosis not present

## 2015-04-08 DIAGNOSIS — F419 Anxiety disorder, unspecified: Secondary | ICD-10-CM | POA: Diagnosis not present

## 2015-04-08 DIAGNOSIS — I10 Essential (primary) hypertension: Secondary | ICD-10-CM | POA: Diagnosis not present

## 2015-04-08 DIAGNOSIS — E119 Type 2 diabetes mellitus without complications: Secondary | ICD-10-CM | POA: Diagnosis not present

## 2015-04-08 DIAGNOSIS — Z79899 Other long term (current) drug therapy: Secondary | ICD-10-CM | POA: Insufficient documentation

## 2015-04-08 DIAGNOSIS — J029 Acute pharyngitis, unspecified: Secondary | ICD-10-CM

## 2015-04-08 DIAGNOSIS — Z794 Long term (current) use of insulin: Secondary | ICD-10-CM | POA: Insufficient documentation

## 2015-04-08 DIAGNOSIS — F329 Major depressive disorder, single episode, unspecified: Secondary | ICD-10-CM | POA: Diagnosis not present

## 2015-04-08 DIAGNOSIS — Z720 Tobacco use: Secondary | ICD-10-CM | POA: Diagnosis not present

## 2015-04-08 LAB — RAPID STREP SCREEN (MED CTR MEBANE ONLY): Streptococcus, Group A Screen (Direct): NEGATIVE

## 2015-04-08 LAB — CBG MONITORING, ED: Glucose-Capillary: 183 mg/dL — ABNORMAL HIGH (ref 65–99)

## 2015-04-08 MED ORDER — MAGIC MOUTHWASH W/LIDOCAINE: 5.0000 mL | Freq: Three times a day (TID) | ORAL | Status: DC | PRN

## 2015-04-08 MED ORDER — AMOXICILLIN 500 MG PO CAPS: 500.0000 mg | ORAL_CAPSULE | Freq: Three times a day (TID) | ORAL | Status: DC

## 2015-04-08 NOTE — ED Notes (Signed)
Pt states sore throat first noticed last night. States swelling noticed 20 min PTA.

## 2015-04-08 NOTE — Discharge Instructions (Signed)

## 2015-04-09 NOTE — ED Provider Notes (Signed)
CSN: [id number]     Arrival date & time 04/08/15  1359 History   First MD Initiated Contact with Patient 04/08/15 1423     Chief Complaint  Patient presents with  . Sore Throat     (Consider location/radiation/quality/duration/timing/severity/associated sxs/prior Treatment) HPI   Melvin Taylor is a 49 y.o. male who presents to the Emergency Department complaining of sore throat for one day.  He denies congestion, fever, ear pain, cough or neck pain.  Pain is worse with swallowing.  He has not tried any therapies prior to arrival.      Past Medical History  Diagnosis Date  . Anxiety   . Depression   . Diabetes mellitus   . HTN (hypertension)   . Sleep apnea   . Bipolar 1 disorder   . Insomnia   . Headache(784.0)   . Schizophrenia   . Hallucinations     "long history of them"  . Seizures     last sz between July 5-9th, 2016   Past Surgical History  Procedure Laterality Date  . Mandible fracture surgery    . Knee surgery    . Tonsillectomy     Family History  Problem Relation Age of Onset  . Arthritis    . Diabetes    . Asthma    . Diabetes Father   . Hypertension Father    Social History  Substance Use Topics  . Smoking status: Current Every Day Smoker -- 1.00 packs/day for 30 years    Types: Cigarettes  . Smokeless tobacco: Never Used  . Alcohol Use: Yes     Comment: 02/11/15 "drink a beer every now and then"    Review of Systems  Constitutional: Negative for fever, chills, activity change and appetite change.  HENT: Positive for sore throat. Negative for congestion, ear pain, facial swelling, trouble swallowing and voice change.   Eyes: Negative for pain and visual disturbance.  Respiratory: Negative for cough and shortness of breath.   Gastrointestinal: Negative for nausea, vomiting and abdominal pain.  Musculoskeletal: Negative for arthralgias, neck pain and neck stiffness.  Skin: Negative for color change and rash.  Neurological: Negative for  dizziness, facial asymmetry, speech difficulty, numbness and headaches.  Hematological: Negative for adenopathy.  All other systems reviewed and are negative.     Allergies  Aspirin  Home Medications   Prior to Admission medications   Medication Sig Start Date End Date Taking? Authorizing Provider  amLODipine (NORVASC) 10 MG tablet Take 10 mg by mouth daily.    Yes Historical Provider, MD  atenolol (TENORMIN) 100 MG tablet Take 100 mg by mouth daily.     Yes Historical Provider, MD  divalproex (DEPAKOTE) 500 MG DR tablet Take 2 tablets (1,000 mg total) by mouth 2 (two) times daily. 02/11/15  Yes Penni Bombard, MD  gabapentin (NEURONTIN) 300 MG capsule Take 1 capsule (300 mg total) by mouth at bedtime. 02/16/15  Yes Tanna Furry, MD  hydrochlorothiazide (HYDRODIURIL) 25 MG tablet Take 25 mg by mouth daily.   Yes Historical Provider, MD  ibuprofen (ADVIL,MOTRIN) 600 MG tablet Take 600 mg by mouth 3 (three) times daily as needed for mild pain.   Yes Historical Provider, MD  insulin aspart (NOVOLOG FLEXPEN) 100 UNIT/ML FlexPen Inject 10 Units into the skin 3 (three) times daily with meals.   Yes Historical Provider, MD  insulin aspart (NOVOLOG) 100 UNIT/ML injection Inject 5 Units into the skin 3 (three) times daily with meals. 07/01/14  Yes Tesfaye  Legrand Rams, MD  Insulin Glargine (LANTUS SOLOSTAR) 100 UNIT/ML Solostar Pen Inject 10 Units into the skin daily at 10 pm.    Yes Historical Provider, MD  levETIRAcetam (KEPPRA) 500 MG tablet Take 2 tablets (1,000 mg total) by mouth 2 (two) times daily. 02/11/15  Yes Penni Bombard, MD  traMADol (ULTRAM) 50 MG tablet Take 1 tablet (50 mg total) by mouth every 6 (six) hours as needed. 03/21/15  Yes Ajeenah Heiny, PA-C  amoxicillin (AMOXIL) 500 MG capsule Take 1 capsule (500 mg total) by mouth 3 (three) times daily. For 10 days 04/08/15   Kem Parkinson, PA-C  magic mouthwash w/lidocaine SOLN Take 5 mLs by mouth 3 (three) times daily as needed for mouth  pain. Swish and spit, do not swallow 04/08/15   Daniell Paradise, PA-C   BP 168/99 mmHg  Pulse 60  Temp(Src) 98.5 F (36.9 C) (Oral)  Resp 16  Ht 5\' 7"  (1.702 m)  Wt 156 lb (70.761 kg)  BMI 24.43 kg/m2  SpO2 100% Physical Exam  Constitutional: He is oriented to person, place, and time. He appears well-developed and well-nourished. No distress.  HENT:  Head: Normocephalic and atraumatic.  Right Ear: Tympanic membrane and ear canal normal.  Left Ear: Tympanic membrane and ear canal normal.  Mouth/Throat: Uvula is midline and mucous membranes are normal. No oral lesions. No trismus in the jaw. No uvula swelling. Posterior oropharyngeal edema and posterior oropharyngeal erythema present. No oropharyngeal exudate or tonsillar abscesses.  Neck: Normal range of motion. Neck supple.  Cardiovascular: Normal rate, regular rhythm and normal heart sounds.   Pulmonary/Chest: Effort normal and breath sounds normal. No respiratory distress.  Abdominal: There is no splenomegaly. There is no tenderness.  Musculoskeletal: Normal range of motion.  Lymphadenopathy:    He has no cervical adenopathy.  Neurological: He is alert and oriented to person, place, and time. He exhibits normal muscle tone. Coordination normal.  Skin: Skin is warm and dry.  Nursing note and vitals reviewed.   ED Course  Procedures (including critical care time) Labs Review Labs Reviewed  CBG MONITORING, ED - Abnormal; Notable for the following:    Glucose-Capillary 183 (*)    All other components within normal limits  RAPID STREP SCREEN (NOT AT [facility])  CULTURE, GROUP A STREP      MDM   Final diagnoses:  Pharyngitis    Pt is well appearing.  Strep screen neg.  Airway is patent, mucous membranes are moist.  No concerning sx's for tonsillar abscess.  Pt appears stable for d/c and agrees to PMD f/u if needed.   Kem Parkinson, PA-C 04/09/15 [place], MD 04/13/15 [phone]

## 2015-04-10 LAB — CULTURE, GROUP A STREP: Strep A Culture: NEGATIVE

## 2015-04-16 ENCOUNTER — Encounter: Payer: Self-pay | Admitting: Nurse Practitioner

## 2015-04-16 ENCOUNTER — Ambulatory Visit (INDEPENDENT_AMBULATORY_CARE_PROVIDER_SITE_OTHER): Payer: Medicaid Other | Admitting: Nurse Practitioner

## 2015-04-16 ENCOUNTER — Other Ambulatory Visit: Payer: Self-pay

## 2015-04-16 VITALS — BP 148/105 | HR 85 | Temp 98.0°F | Ht 67.0 in | Wt 145.6 lb

## 2015-04-16 DIAGNOSIS — Z1211 Encounter for screening for malignant neoplasm of colon: Secondary | ICD-10-CM | POA: Diagnosis not present

## 2015-04-16 DIAGNOSIS — R1310 Dysphagia, unspecified: Secondary | ICD-10-CM | POA: Insufficient documentation

## 2015-04-16 MED ORDER — PEG 3350-KCL-NA BICARB-NACL 420 G PO SOLR: 4000.0000 mL | ORAL | Status: DC

## 2015-04-16 NOTE — Progress Notes (Signed)
Primary Care Physician:  Rosita Fire, MD Primary Gastroenterologist:  Dr. Gala Romney  Chief Complaint  Patient presents with  . set up TCS    HPI:   49 year old male referred by PCP for screening colonoscopy. PCP notes reviewed. No mention of gastrointestinal symptoms at PCP visit. Ineligible for phone triage due to meds.  Today he states he has never had a colonoscopy before. Admits abdominal pain in his lower abdomen described as sharp and occurring a couple times a day which are brief and promptly self resolve. Denies N/V, change in bowel habits, hematochezia, melena. Has a bowel movement once daily which are consistent with [facility] 4. Denies fever, chills. States he has lost about 100 pounds over the past 6 months unintentionally. (Objective weight loss per system records are: 15 lb in 8 months, 33 lb in 1.5 yrs, and 60 lb in 4.5 yrs.) Has dysphagia symptoms over the past 3 years, "sometimes with every meal, I have to chew my food up good." Usually has to drink water to get solid food to pass when he is having a dysphagia episode, occasional regurgitation. Denies chest pain, dyspnea, dizziness, lightheadedness, syncope, near syncope. Denies any other upper or lower GI symptoms.  Past Medical History  Diagnosis Date  . Anxiety   . Depression   . Diabetes mellitus   . HTN (hypertension)   . Sleep apnea   . Bipolar 1 disorder   . Insomnia   . Headache(784.0)   . Schizophrenia   . Hallucinations     "long history of them"  . Seizures     last sz between July 5-9th, 2016    Past Surgical History  Procedure Laterality Date  . Mandible fracture surgery    . Knee surgery    . Tonsillectomy      Current Outpatient Prescriptions  Medication Sig Dispense Refill  . divalproex (DEPAKOTE) 500 MG DR tablet Take 2 tablets (1,000 mg total) by mouth 2 (two) times daily. 120 tablet 12  . insulin aspart (NOVOLOG FLEXPEN) 100 UNIT/ML FlexPen Inject 10 Units into the skin 3 (three) times  daily with meals.    . Insulin Glargine (LANTUS SOLOSTAR) 100 UNIT/ML Solostar Pen Inject 10 Units into the skin daily at 10 pm.     . levETIRAcetam (KEPPRA) 500 MG tablet Take 2 tablets (1,000 mg total) by mouth 2 (two) times daily. 120 tablet 12  . magic mouthwash w/lidocaine SOLN Take 5 mLs by mouth 3 (three) times daily as needed for mouth pain. Swish and spit, do not swallow 100 mL 0  . traMADol (ULTRAM) 50 MG tablet Take 1 tablet (50 mg total) by mouth every 6 (six) hours as needed. 15 tablet 0  . amLODipine (NORVASC) 10 MG tablet Take 10 mg by mouth daily.     Marland Kitchen amoxicillin (AMOXIL) 500 MG capsule Take 1 capsule (500 mg total) by mouth 3 (three) times daily. For 10 days (Patient not taking: Reported on 04/16/2015) 30 capsule 0  . atenolol (TENORMIN) 100 MG tablet Take 100 mg by mouth daily.      Marland Kitchen gabapentin (NEURONTIN) 300 MG capsule Take 1 capsule (300 mg total) by mouth at bedtime. (Patient not taking: Reported on 04/16/2015) 30 capsule 0  . hydrochlorothiazide (HYDRODIURIL) 25 MG tablet Take 25 mg by mouth daily.    Marland Kitchen ibuprofen (ADVIL,MOTRIN) 600 MG tablet Take 600 mg by mouth 3 (three) times daily as needed for mild pain.     No current facility-administered medications for this  visit.    Allergies as of 04/16/2015 - Review Complete 04/16/2015  Allergen Reaction Noted  . Aspirin Swelling 12/28/2010    Family History  Problem Relation Age of Onset  . Arthritis    . Diabetes    . Asthma    . Diabetes Father   . Hypertension Father     Social History   Social History  . Marital Status: Single    Spouse Name: N/A  . Number of Children: N/A  . Years of Education: 11th grade   Occupational History  . unemployed    Social History Main Topics  . Smoking status: Current Every Day Smoker -- 1.00 packs/day for 30 years    Types: Cigarettes  . Smokeless tobacco: Never Used  . Alcohol Use: Yes     Comment: 02/11/15 "drink a beer every now and then"  . Drug Use: No      Comment: hx of cocaine use but denies using in over a year, denies marjuana use 01/18/15 and 02/11/15  . Sexual Activity: Not Currently   Other Topics Concern  . Not on file   Social History Narrative   Lives alone   Drinks a cup of coffee a day    Review of Systems: General: Negative for anorexia, fever, chills, fatigue, weakness. Eyes: Negative for vision changes.  ENT: Negative for hoarseness. CV: Negative for chest pain, angina, palpitations, peripheral edema.  Respiratory: Negative for dyspnea at rest, cough, sputum, wheezing.  GI: See history of present illness. Derm: Negative for rash or itching.  Endo: Negative for unusual weight change.  Heme: Negative for bruising or bleeding. Allergy: Negative for rash or hives.    Physical Exam: BP 148/105 mmHg  Pulse 85  Temp(Src) 98 F (36.7 C)  Ht 5\' 7"  (1.702 m)  Wt 145 lb 9.6 oz (66.044 kg)  BMI 22.80 kg/m2 General:   Alert and oriented. Pleasant and cooperative. Well-nourished and well-developed.  Head:  Normocephalic and atraumatic. Eyes:  Without icterus, sclera clear and conjunctiva pink.  Ears:  Normal auditory acuity. Cardiovascular:  S1, S2 present without murmurs appreciated. Extremities without clubbing or edema. Respiratory:  Clear to auscultation bilaterally. No wheezes, rales, or rhonchi. No distress.  Gastrointestinal:  +BS, soft, non-tender and non-distended. No HSM noted. No guarding or rebound. No masses appreciated.  Rectal:  Deferred  Skin:  Intact without significant lesions or rashes. Neurologic:  Alert and oriented x4;  grossly normal neurologically. Psych:  Alert and cooperative. Normal mood and affect.     04/16/2015 1:43 PM

## 2015-04-16 NOTE — Patient Instructions (Signed)
1. We will schedule your procedure for you. 2. Make sure you pickup her blood pressure medication from the pharmacy and take it as directed by her primary care. 3. If your blood pressure continues to not be under control during her your preprocedure visit, and will likely be canceled. 4. Return for follow-up in 3 months to follow-up on her procedures and discuss your dysphagia symptoms.

## 2015-04-17 NOTE — Assessment & Plan Note (Addendum)
Patient with intermittent dysphagia. When he 7 episode needs to swallow copious months of fluids to get food to pass. States that sometimes happens with every meal. Never had an upper endoscopy before. Cannot rule out stricture, web, ring. We'll plan for an endoscopy in addition to his required screening colonoscopy in the OR propofol as noted below. In the meantime, given advice regarding eating smaller meals, chewing adequately, eating softer foods.  Proceed with EGD +/- dilation with Dr. Gala Romney in near future: the risks, benefits, and alternatives have been discussed with the patient in detail. The patient states understanding and desires to proceed.  Procedure and the OR with propofol. See notes regarding procedure below.

## 2015-04-17 NOTE — Assessment & Plan Note (Signed)
Patient referred for screening colonoscopy. Has never had a colonoscopy before. Is having some lower abdominal pain which is crampy and occasionally sharp. Also with dysphagia symptoms as noted above. Admits subjective weight loss of 100 pounds in a few months. Objectively, his weight loss is been much more subtle including 15 pounds the past 9 months and 60 pounds in the past 4-1/2 years. He has diabetic and this could be somewhat related. We'll proceed with colonoscopy in addition to endoscopy as noted above.  Proceed with TCS with Dr. Gala Romney in near future: the risks, benefits, and alternatives have been discussed with the patient in detail. The patient states understanding and desires to proceed.  The patient is not on any anticoagulants. He is on Depakote, Neurontin, Keppra, Ultram. History of cocaine use although he states he has not used in over a year. History of marijuana use, although he states he has not recently use. We'll need a urine drug screen at preop visit. Due to medications we'll plan for procedure and the OR propofol.  His blood pressure is a bit high today, he states a new blood pressure medication has been called into his pharmacy but he has not picked it up or become yet. I stressed the importance of following prescribed blood pressure medications and emphasized that his blood pressure is not controlled at his preop visit his procedure we'll likely be canceled. He verbalized understanding.

## 2015-04-20 ENCOUNTER — Emergency Department (HOSPITAL_COMMUNITY)
Admission: EM | Admit: 2015-04-20 | Discharge: 2015-04-20 | Disposition: A | Discharge status: Home / Self Care | Payer: Medicaid Other | Attending: Emergency Medicine | Admitting: Emergency Medicine

## 2015-04-20 ENCOUNTER — Telehealth: Payer: Self-pay | Admitting: Diagnostic Neuroimaging

## 2015-04-20 ENCOUNTER — Encounter (HOSPITAL_COMMUNITY): Payer: Self-pay | Admitting: Emergency Medicine

## 2015-04-20 DIAGNOSIS — Z794 Long term (current) use of insulin: Secondary | ICD-10-CM | POA: Insufficient documentation

## 2015-04-20 DIAGNOSIS — F419 Anxiety disorder, unspecified: Secondary | ICD-10-CM | POA: Insufficient documentation

## 2015-04-20 DIAGNOSIS — M79604 Pain in right leg: Secondary | ICD-10-CM | POA: Diagnosis present

## 2015-04-20 DIAGNOSIS — M79605 Pain in left leg: Secondary | ICD-10-CM | POA: Insufficient documentation

## 2015-04-20 DIAGNOSIS — G40909 Epilepsy, unspecified, not intractable, without status epilepticus: Secondary | ICD-10-CM | POA: Insufficient documentation

## 2015-04-20 DIAGNOSIS — F319 Bipolar disorder, unspecified: Secondary | ICD-10-CM | POA: Diagnosis not present

## 2015-04-20 DIAGNOSIS — M2550 Pain in unspecified joint: Secondary | ICD-10-CM

## 2015-04-20 DIAGNOSIS — Z720 Tobacco use: Secondary | ICD-10-CM | POA: Insufficient documentation

## 2015-04-20 DIAGNOSIS — Z79899 Other long term (current) drug therapy: Secondary | ICD-10-CM | POA: Diagnosis not present

## 2015-04-20 DIAGNOSIS — E1149 Type 2 diabetes mellitus with other diabetic neurological complication: Secondary | ICD-10-CM | POA: Insufficient documentation

## 2015-04-20 DIAGNOSIS — I10 Essential (primary) hypertension: Secondary | ICD-10-CM | POA: Diagnosis not present

## 2015-04-20 MED ORDER — MORPHINE SULFATE (PF) 4 MG/ML IV SOLN
8.0000 mg | Freq: Once | INTRAVENOUS | Status: AC
  Administered 2015-04-20: 8 mg via INTRAMUSCULAR
  Filled 2015-04-20: qty 2

## 2015-04-20 MED ORDER — ONDANSETRON HCL 4 MG PO TABS
4.0000 mg | ORAL_TABLET | Freq: Once | ORAL | Status: AC
  Administered 2015-04-20: 4 mg via ORAL
  Filled 2015-04-20: qty 1

## 2015-04-20 MED ORDER — TRAMADOL HCL 50 MG PO TABS: ORAL_TABLET | ORAL | Status: DC

## 2015-04-20 NOTE — Progress Notes (Signed)
cc'ed to pcp °

## 2015-04-20 NOTE — Telephone Encounter (Signed)
Needs to follow up with PCP for pain mgmt; or needs PCP to refer to pain mgmt clinic. -VRP

## 2015-04-20 NOTE — Telephone Encounter (Signed)
Patient called to advise he was seen in the hospital for pain in his legs. Patient states "they won't give him any pain medication and advised that he call his Dr. For pain meds. Patient states he has pain in his legs due to diabetic neuropathy. Dr. in [place]/Specialist wouldn't prescribe, was going to send to pain clinic but pain clinic doesn't take Medicaid". Patient states "Dr. Legrand Rams is PCP and he won't prescribe pain meds either, Dr Legrand Rams advised him to see Neurologist for pain meds". I advised patient I would send message to nurse, however I don't know that Dr. Leta Baptist will be able to prescribe either since we treat him for seizures.

## 2015-04-20 NOTE — Discharge Instructions (Signed)
Please see Dr. Legrand Rams for evaluation of your joint pain, as well as your diabetic neuropathy. Please continue your current medications. Please add Ultram to assist.

## 2015-04-20 NOTE — Telephone Encounter (Signed)
Spoke with patient and informed him, per Dr Leta Baptist he should speak with Dr Legrand Rams for another referral to a pain clinic that accepts Medicaid. Patient states it was Dr Dorris Fetch,  diabetic dr that sent him to pain clinic. Advised he call Dr Liliane Channel office for another referral. Patient states he called them this morning; encouraged him to call back if he doesn't hear from them. Informed him that keeping his diabetes under control will help with his pain, and that he needs to FU with Dr Dorris Fetch. Patient verbalized understanding.

## 2015-04-20 NOTE — Telephone Encounter (Signed)
Spoke with patient who states he "just got home from hospital". He states that he was "given a shot of Morphine". He states the Tramadol he was prescribed today "is not helpful". He states he was given that medicine last week. He states he has been told to see his neurologist for his leg pain. He states that he was sent to pain clinic, but "they didn't take Medicaid". He states "I go to all these drs, and no one will give me pain medications."  Informed patient that Dr Leta Baptist saw him for his seizures, not for neuropathy. Inquired about the Gabapentin on his med list; he states "it didn't help. They told me to increase it, and that didn't help."  Informed patient this RN will discuss with Dr Leta Baptist and call him back no later than 5 pm tomorrow. He verbalized understanding.

## 2015-04-20 NOTE — ED Notes (Signed)
Pt reports bilat leg pain that started this am.

## 2015-04-20 NOTE — ED Provider Notes (Signed)
CSN: [id number]     Arrival date & time 04/20/15  1107 History  This chart was scribed for non-physician practitioner, Lily Kocher, PA-C working with Dorie Rank, MD by Tula Nakayama, ED scribe. This patient was seen in room APFT24/APFT24 and the patient's care was started at 11:58 AM   Chief Complaint  Patient presents with  . Leg Pain   Patient is a 49 y.o. male presenting with leg pain. The history is provided by the patient. No language interpreter was used.  Leg Pain Location:  Leg Time since incident:  1 day Injury: no   Leg location:  L leg and R leg Pain details:    Quality:  Aching   Radiates to:  Does not radiate   Severity:  Moderate   Onset quality:  Gradual   Duration:  1 day   Timing:  Constant   HPI Comments: RUMI TARAS is a 50 y.o. male with a history of DM and diabetic neuropathy who presents to the Emergency Department complaining of constant, moderate, aching bilateral whole leg pain that started this morning. His pain becomes worse with walking. Pt is currently being followed by his PCP for diabetic neuropathy. He takes Neurontin-300 mg daily. Pt was referred to a pain clinic, but was refused by the clinic because he is on Medicaid. He denies numbness.  Past Medical History  Diagnosis Date  . Anxiety   . Depression   . Diabetes mellitus   . HTN (hypertension)   . Sleep apnea   . Bipolar 1 disorder   . Insomnia   . Headache(784.0)   . Schizophrenia   . Hallucinations     "long history of them"  . Seizures     last sz between July 5-9th, 2016   Past Surgical History  Procedure Laterality Date  . Mandible fracture surgery    . Knee surgery    . Tonsillectomy     Family History  Problem Relation Age of Onset  . Arthritis    . Diabetes    . Asthma    . Diabetes Father   . Hypertension Father    Social History  Substance Use Topics  . Smoking status: Current Every Day Smoker -- 1.00 packs/day for 30 years    Types: Cigarettes  .  Smokeless tobacco: Never Used  . Alcohol Use: Yes     Comment: 02/11/15 "drink a beer every now and then"    Review of Systems  Musculoskeletal: Positive for arthralgias and gait problem.  Neurological: Negative for numbness.  All other systems reviewed and are negative.  Allergies  Aspirin  Home Medications   Prior to Admission medications   Medication Sig Start Date End Date Taking? Authorizing Donnell Wion  amLODipine (NORVASC) 10 MG tablet Take 10 mg by mouth daily.     Historical Camarion Weier, MD  amoxicillin (AMOXIL) 500 MG capsule Take 1 capsule (500 mg total) by mouth 3 (three) times daily. For 10 days Patient not taking: Reported on 04/16/2015 04/08/15   Tammy Triplett, PA-C  atenolol (TENORMIN) 100 MG tablet Take 100 mg by mouth daily.      Historical Chequita Mofield, MD  divalproex (DEPAKOTE) 500 MG DR tablet Take 2 tablets (1,000 mg total) by mouth 2 (two) times daily. 02/11/15   Penni Bombard, MD  gabapentin (NEURONTIN) 300 MG capsule Take 1 capsule (300 mg total) by mouth at bedtime. Patient not taking: Reported on 04/16/2015 02/16/15   Tanna Furry, MD  hydrochlorothiazide (HYDRODIURIL) 25 MG tablet  Take 25 mg by mouth daily.    Historical Trysten Berti, MD  ibuprofen (ADVIL,MOTRIN) 600 MG tablet Take 600 mg by mouth 3 (three) times daily as needed for mild pain.    Historical Kenleigh Toback, MD  insulin aspart (NOVOLOG FLEXPEN) 100 UNIT/ML FlexPen Inject 10 Units into the skin 3 (three) times daily with meals.    Historical Sidney Kann, MD  Insulin Glargine (LANTUS SOLOSTAR) 100 UNIT/ML Solostar Pen Inject 10 Units into the skin daily at 10 pm.     Historical Belita Warsame, MD  levETIRAcetam (KEPPRA) 500 MG tablet Take 2 tablets (1,000 mg total) by mouth 2 (two) times daily. 02/11/15   Penni Bombard, MD  magic mouthwash w/lidocaine SOLN Take 5 mLs by mouth 3 (three) times daily as needed for mouth pain. Swish and spit, do not swallow 04/08/15   Tammy Triplett, PA-C  polyethylene glycol-electrolytes  (TRILYTE) 420 G solution Take 4,000 mLs by mouth as directed. 04/16/15   [name], MD  traMADol (ULTRAM) 50 MG tablet Take 1 tablet (50 mg total) by mouth every 6 (six) hours as needed. 03/21/15   Tammy Triplett, PA-C   BP 138/90 mmHg  Pulse 61  Temp(Src) 98.1 F (36.7 C) (Oral)  Resp 16  Ht 5\' 7"  (1.702 m)  Wt 155 lb (70.308 kg)  BMI 24.27 kg/m2  SpO2 100% Physical Exam  Constitutional: He is oriented to person, place, and time. He appears well-developed and well-nourished. No distress.  HENT:  Head: Normocephalic and atraumatic.  Eyes: Conjunctivae and EOM are normal.  Neck: Neck supple. No tracheal deviation present.  Cardiovascular: Normal rate.   Pulmonary/Chest: Effort normal. No respiratory distress.  Musculoskeletal:  Full ROM of lower extremities, but with pain  Neurological: He is alert and oriented to person, place, and time.  No motor or sensory deficits of lower extremities Gait is steady No foot drop  Skin: Skin is warm and dry.  Psychiatric: He has a normal mood and affect. His behavior is normal.  Nursing note and vitals reviewed.   ED Course  Procedures   DIAGNOSTIC STUDIES: Oxygen Saturation is 100% on RA, normal by my interpretation.    COORDINATION OF CARE: 12:00 PM Discussed treatment plan with pt which includes IM morphine in the ED and a prescription for Ultram. Pt to continue Neurontin as prescribed by PCP and follow-up with Dr. Legrand Rams. Pt agreed to plan.  MDM  Vital signs stable. No gross neuro deficit. Pt taking neurontin, but states it is not helping. Rx for ultram given to the patient. He is to follow up with PCP for additional management.   Final diagnoses:  Arthralgia  Other diabetic neurological complication associated with type 2 diabetes mellitus    **I have reviewed nursing notes, vital signs, and all appropriate lab and imaging results for this patient.*  **I personally performed the services described in this documentation,  which was scribed in my presence. The recorded information has been reviewed and is accurate.Lily Kocher, PA-C 04/20/15 2104  Dorie Rank, MD 04/22/15 1128

## 2015-04-29 ENCOUNTER — Encounter (HOSPITAL_COMMUNITY): Payer: Self-pay | Admitting: Emergency Medicine

## 2015-04-29 ENCOUNTER — Emergency Department (HOSPITAL_COMMUNITY)
Admission: EM | Admit: 2015-04-29 | Discharge: 2015-04-29 | Disposition: A | Discharge status: Home / Self Care | Payer: Medicaid Other | Attending: Emergency Medicine | Admitting: Emergency Medicine

## 2015-04-29 DIAGNOSIS — I10 Essential (primary) hypertension: Secondary | ICD-10-CM | POA: Diagnosis not present

## 2015-04-29 DIAGNOSIS — E1140 Type 2 diabetes mellitus with diabetic neuropathy, unspecified: Secondary | ICD-10-CM

## 2015-04-29 DIAGNOSIS — R200 Anesthesia of skin: Secondary | ICD-10-CM | POA: Diagnosis not present

## 2015-04-29 DIAGNOSIS — M79605 Pain in left leg: Secondary | ICD-10-CM | POA: Diagnosis present

## 2015-04-29 DIAGNOSIS — M6281 Muscle weakness (generalized): Secondary | ICD-10-CM | POA: Insufficient documentation

## 2015-04-29 DIAGNOSIS — Z79899 Other long term (current) drug therapy: Secondary | ICD-10-CM | POA: Insufficient documentation

## 2015-04-29 DIAGNOSIS — G4700 Insomnia, unspecified: Secondary | ICD-10-CM | POA: Insufficient documentation

## 2015-04-29 DIAGNOSIS — Z720 Tobacco use: Secondary | ICD-10-CM | POA: Insufficient documentation

## 2015-04-29 DIAGNOSIS — F319 Bipolar disorder, unspecified: Secondary | ICD-10-CM | POA: Insufficient documentation

## 2015-04-29 DIAGNOSIS — M79604 Pain in right leg: Secondary | ICD-10-CM | POA: Diagnosis not present

## 2015-04-29 DIAGNOSIS — G40909 Epilepsy, unspecified, not intractable, without status epilepticus: Secondary | ICD-10-CM | POA: Diagnosis not present

## 2015-04-29 DIAGNOSIS — Z794 Long term (current) use of insulin: Secondary | ICD-10-CM | POA: Insufficient documentation

## 2015-04-29 DIAGNOSIS — F419 Anxiety disorder, unspecified: Secondary | ICD-10-CM | POA: Diagnosis not present

## 2015-04-29 MED ORDER — HYDROCODONE-ACETAMINOPHEN 5-325 MG PO TABS
2.0000 | ORAL_TABLET | Freq: Once | ORAL | Status: AC
  Administered 2015-04-29: 2 via ORAL
  Filled 2015-04-29: qty 2

## 2015-04-29 NOTE — ED Provider Notes (Signed)
CSN: [id number]     Arrival date & time 04/29/15  8756 History  By signing my name below, I, Melvin Taylor, attest that this documentation has been prepared under the direction and in the presence of Melvin Morrison, MD. Electronically Signed: Stephania Taylor, ED Scribe. 04/29/2015. 9:25 AM.   Chief Complaint  Patient presents with  . Leg Pain   The history is provided by the patient. No language interpreter was used.   HPI Comments: Melvin Taylor is a 49 y.o. male with a history of DM, who presents to the Emergency Department complaining of chronic, constant, severe bilateral leg pain that he characterizes as feeling like lightning bolts shooting down his legs. He endorses associated left leg weakness and numbness due to neuropathy, in addition to headaches and general discomfort due to the severity of his leg pain. Patient reports a history of diabetic neuropathy for the past 2 months and states this feels like neuropathy. His PCP had prescribed him increasing doses of Neurontin that provided no relief. Extending his leg and walking both exacerbate his pain. He states he is scheduled for pain management but he is unable to see them for a long period of time.He denies leg swelling, urinary symptoms.  Patient has NKDA.    Past Medical History  Diagnosis Date  . Anxiety   . Depression   . Diabetes mellitus   . HTN (hypertension)   . Sleep apnea   . Bipolar 1 disorder   . Insomnia   . Headache(784.0)   . Schizophrenia   . Hallucinations     "long history of them"  . Seizures     last sz between July 5-9th, 2016   Past Surgical History  Procedure Laterality Date  . Mandible fracture surgery    . Knee surgery    . Tonsillectomy     Family History  Problem Relation Age of Onset  . Arthritis    . Diabetes    . Asthma    . Diabetes Father   . Hypertension Father    Social History  Substance Use Topics  . Smoking status: Current Every Day Smoker -- 1.00 packs/day for 30 years   Types: Cigarettes  . Smokeless tobacco: Never Used  . Alcohol Use: Yes     Comment: 02/11/15 "drink a beer every now and then"    Review of Systems  All other systems reviewed and are negative.     Allergies  Aspirin  Home Medications   Prior to Admission medications   Medication Sig Start Date End Date Taking? Authorizing Provider  amLODipine (NORVASC) 10 MG tablet Take 10 mg by mouth daily.     Historical Provider, MD  amoxicillin (AMOXIL) 500 MG capsule Take 1 capsule (500 mg total) by mouth 3 (three) times daily. For 10 days 04/08/15   Tammy Triplett, PA-C  atenolol (TENORMIN) 100 MG tablet Take 100 mg by mouth daily.      Historical Provider, MD  divalproex (DEPAKOTE) 500 MG DR tablet Take 2 tablets (1,000 mg total) by mouth 2 (two) times daily. 02/11/15   Penni Bombard, MD  gabapentin (NEURONTIN) 300 MG capsule Take 1 capsule (300 mg total) by mouth at bedtime. 02/16/15   Tanna Furry, MD  hydrochlorothiazide (HYDRODIURIL) 25 MG tablet Take 25 mg by mouth daily.    Historical Provider, MD  insulin aspart (NOVOLOG FLEXPEN) 100 UNIT/ML FlexPen Inject 10 Units into the skin 3 (three) times daily with meals.    Historical Provider, MD  Insulin Glargine (LANTUS SOLOSTAR) 100 UNIT/ML Solostar Pen Inject 10 Units into the skin daily at 10 pm.     Historical Provider, MD  levETIRAcetam (KEPPRA) 500 MG tablet Take 2 tablets (1,000 mg total) by mouth 2 (two) times daily. 02/11/15   Penni Bombard, MD  magic mouthwash w/lidocaine SOLN Take 5 mLs by mouth 3 (three) times daily as needed for mouth pain. Swish and spit, do not swallow 04/08/15   Tammy Triplett, PA-C  polyethylene glycol-electrolytes (TRILYTE) 420 G solution Take 4,000 mLs by mouth as directed. 04/16/15   [name], MD  traMADol Veatrice Bourbon) 50 MG tablet 1 or 2 po q6h prn pain Patient not taking: Reported on 04/27/2015 04/20/15   Lily Kocher, PA-C   There were no vitals taken for this visit. Physical Exam  Constitutional: He  is oriented to person, place, and time. He appears well-developed and well-nourished. No distress.  HENT:  Head: Normocephalic and atraumatic.  Eyes: Conjunctivae and EOM are normal.  Neck: Neck supple. No tracheal deviation present.  Cardiovascular: Normal rate.   Pulmonary/Chest: Effort normal. No respiratory distress.  Musculoskeletal: Normal range of motion. He exhibits tenderness.  Sensation intact to scratching. No tenderness to palpation; however, pt reports pain with extension of leg. 5+ strength with flexion and extension of left knee and left ankle. 2+ PT and DP pulses. Good cap refill.   Neurological: He is alert and oriented to person, place, and time.  Skin: Skin is warm and dry.  Psychiatric: He has a normal mood and affect. His behavior is normal.  Nursing note and vitals reviewed.   ED Course  Procedures (including critical care time)  DIAGNOSTIC STUDIES: Oxygen Saturation is 100% on RA, normal by my interpretation.    COORDINATION OF CARE: 9:02 AM - Discussed treatment plan with pt at bedside which includes referral to PCP and pain management specialist. Pt verbalized understanding and agreed to plan.    Labs Review Labs Reviewed - No data to display  Imaging Review No results found. I have personally reviewed and evaluated these images and lab results as part of my medical decision-making.   EKG Interpretation None      MDM   Final diagnoses:  None   Patient presents with diabetic neuropathy pain similar to previous. Normal neurologic exam and normal pulses distal. Pain meds in the ER however patient understands we will not refill or prescribed narcotics and patient will follow-up with pain management and primary doctor. No concern for blood clot this time.  Results and differential diagnosis were discussed with the patient/parent/guardian. Xrays were independently reviewed by myself.  Close follow up outpatient was discussed, comfortable with the plan.    Medications  HYDROcodone-acetaminophen (NORCO/VICODIN) 5-325 MG per tablet 2 tablet (2 tablets Oral Given 04/29/15 0941)    Filed Vitals:   04/29/15 0902 04/29/15 0906  BP:  153/110  Pulse:  57  Temp:  97.9 F (36.6 C)  TempSrc:  Oral  Resp:  16  Height: 5\' 7"  (1.702 m)   Weight: 150 lb (68.04 kg)   SpO2:  100%    Final diagnoses:  Diabetic neuropathy, painful      Melvin Morrison, MD 04/29/15 1032

## 2015-04-29 NOTE — ED Notes (Signed)
Pt requesting discharge papers at this time. MD made aware.

## 2015-04-29 NOTE — ED Notes (Signed)
Pt reports chronic bilateral leg pain. Pt reports increase in pain today. Pt reports is being referred to a pain clinic but has not been able to be seen yet. Pt reports takes px pain med at home with no relief. Pt denies any known injury.

## 2015-04-29 NOTE — Discharge Instructions (Signed)
If you were given medicines take as directed.  If you are on coumadin or contraceptives realize their levels and effectiveness is altered by many different medicines.  If you have any reaction (rash, tongues swelling, other) to the medicines stop taking and see a physician.    If your blood pressure was elevated in the ER make sure you follow up for management with a primary doctor or return for chest pain, shortness of breath or stroke symptoms.  Please follow up as directed and return to the ER or see a physician for new or worsening symptoms.  Thank you. Filed Vitals:   04/29/15 0902 04/29/15 0906  BP:  153/110  Pulse:  57  Temp:  97.9 F (36.6 C)  TempSrc:  Oral  Resp:  16  Height: 5\' 7"  (1.702 m)   Weight: 150 lb (68.04 kg)   SpO2:  100%

## 2015-05-01 NOTE — Patient Instructions (Signed)
Melvin Taylor  05/01/2015     @PREFPERIOPPHARMACY @   Your procedure is scheduled on 05/07/2015.  Report to [facility] at 9:30 A.M.  Call this number if you have problems the morning of surgery:  [phone]   Remember:  Do not eat food or drink liquids after midnight.  Take these medicines the morning of surgery with A SIP OF WATER amlodipine, atenolol, depakote, keppra, tramadol    Do not wear jewelry, make-up or nail polish.  Do not wear lotions, powders, or perfumes.  You may wear deodorant.  Do not shave 48 hours prior to surgery.  Men may shave face and neck.  Do not bring valuables to the hospital.  [name] is not responsible for any belongings or valuables.  Contacts, dentures or bridgework may not be worn into surgery.  Leave your suitcase in the car.  After surgery it may be brought to your room.  For patients admitted to the hospital, discharge time will be determined by your treatment team.  Patients discharged the day of surgery will not be allowed to drive home.   Please read over the following fact sheets that you were given. Anesthesia Post-op Instructions     PATIENT INSTRUCTIONS POST-ANESTHESIA  IMMEDIATELY FOLLOWING SURGERY:  Do not drive or operate machinery for the first twenty four hours after surgery.  Do not make any important decisions for twenty four hours after surgery or while taking narcotic pain medications or sedatives.  If you develop intractable nausea and vomiting or a severe headache please notify your doctor immediately.  FOLLOW-UP:  Please make an appointment with your surgeon as instructed. You do not need to follow up with anesthesia unless specifically instructed to do so.  WOUND CARE INSTRUCTIONS (if applicable):  Keep a dry clean dressing on the anesthesia/puncture wound site if there is drainage.  Once the wound has quit draining you may leave it open to air.  Generally you should leave the bandage intact for twenty four  hours unless there is drainage.  If the epidural site drains for more than 36-48 hours please call the anesthesia department.  QUESTIONS?:  Please feel free to call your physician or the hospital operator if you have any questions, and they will be happy to assist you.      Esophageal Dilatation The esophagus is the long, narrow tube which carries food and liquid from the mouth to the stomach. Esophageal dilatation is the technique used to stretch a blocked or narrowed portion of the esophagus. This procedure is used when a part of the esophagus has become so narrow that it becomes difficult, painful or even impossible to swallow. This is generally an uncomplicated form of treatment. When this is not successful, chest surgery may be required. This is a much more extensive form of treatment with a longer recovery time. CAUSES  Some of the more common causes of blockage or strictures of the esophagus are:  Narrowing from longstanding inflammation (soreness and redness) of the lower esophagus. This comes from the constant exposure of the lower esophagus to the acid which bubbles up from the stomach. Over time this causes scarring and narrowing of the lower esophagus.  Hiatal hernia in which a small part of the stomach bulges (herniates) up through the diaphragm. This can cause a gradual narrowing of the end of the esophagus.  Schatzki ring is a narrow ring of benign (non-cancerous) fibrous tissue which constricts the lower esophagus. The reason for this is not known.  Scleroderma is a connective tissue disorder that affects the esophagus and makes swallowing difficult.  Achalasia is an absence of nerves to the lower esophagus and to the esophageal sphincter. This is the circular muscle between the stomach and esophagus that relaxes to allow food into the stomach. After swallowing, it contracts to keep food in the stomach. This absence of nerves may be congenital (present since birth). This can cause  irregular spasms of the lower esophageal muscle. This spasm does not open up to allow food and fluid through. The result is a persistent blockage with subsequent slow trickling of the esophageal contents into the stomach.  Strictures may develop from swallowing materials which damage the esophagus. Some examples are strong acids or alkalis such as lye.  Growths such as benign (non-cancerous) and malignant (cancerous) tumors can block the esophagus.  Hereditary (present since birth) causes. DIAGNOSIS  Your caregiver often suspects this problem by taking a medical history. They will also do a physical exam. They can then prove their suspicions using X-rays and endoscopy. Endoscopy is an exam in which a tube like a small, flexible telescope is used to look at your esophagus.  TREATMENT There are different stretching (dilating) techniques that can be used. Simple bougie dilatation may be done in the office. This usually takes only a couple minutes. A numbing (anesthetic) spray of the throat is used. Endoscopy, when done, is done in an endoscopy suite under mild sedation. When fluoroscopy is used, the procedure is performed in X-ray. Other techniques require a little longer time. Recovery is usually quick. There is no waiting time to begin eating and drinking to test success of the treatment. Following are some of the methods used. Narrowing of the esophagus is treated by making it bigger. Commonly this is a mechanical problem which can be treated with stretching. This can be done in different ways. Your caregiver will discuss these with you. Some of the means used are:  A series of graduated (increasing thickness) flexible dilators can be used. These are weighted tubes passed through the esophagus into the stomach. The tubes used become progressively larger until the desired stretched size is reached. Graduated dilators are a simple and quick way of opening the esophagus. No visualization is  required.  Another method is the use of endoscopy to place a flexible wire across the stricture. The endoscope is removed and the wire left in place. A dilator with a hole through it from end to end is guided down the esophagus and across the stricture. One or more of these dilators are passed over the wire. At the end of the exam, the wire is removed. This type of treatment may be performed in the X-ray department under fluoroscopy. An advantage of this procedure is the examiner is visualizing the end opening in the esophagus.  Stretching of the esophagus may be done using balloons. Deflated balloons are placed through the endoscope and across the stricture. This type of balloon dilatation is often done at the time of endoscopy or fluoroscopy. Flexible endoscopy allows the examiner to directly view the stricture. A balloon is inserted in the deflated form into the area of narrowing. It is then inflated with air to a certain pressure that is preset for a given circumference. When inflated, it becomes sausage shaped, stretched, and makes the stricture larger.  Achalasia requires a longer, larger balloon-type dilator. This is frequently done under X-ray control. In this situation, the spastic muscle fibers in the lower esophagus are stretched. All  of the above procedures make the passage of food and water into the stomach easier. They also make it easier for stomach contents to reflux back into the esophagus. Special medications may be used following the procedure to help prevent further stricturing. Proton-pump inhibitor medications are good at decreasing the amount of acid in the stomach juice. When stomach juice refluxes into the esophagus, the juice is no longer as acidic and is less likely to burn or scar the esophagus. RISKS AND COMPLICATIONS Esophageal dilatation is usually performed effectively and without problems. Some complications that can occur are:  A small amount of bleeding almost always  happens where the stretching takes place. If this is too excessive it may require more aggressive treatment.  An uncommon complication is perforation (making a hole) of the esophagus. The esophagus is thin. It is easy to make a hole in it. If this happens, an operation may be necessary to repair this.  A small, undetected perforation could lead to an infection in the chest. This can be very serious. HOME CARE INSTRUCTIONS   If you received sedation for your procedure, do not drive, make important decisions, or perform any activities requiring your full coordination. Do not drink alcohol, take sedatives, or use any mind altering chemicals unless instructed by your caregiver.  You may use throat lozenges or warm salt water gargles if you have throat discomfort.  You can begin eating and drinking normally on return home unless instructed otherwise. Do not purposely try to force large chunks of food down to test the benefits of your procedure.  Mild discomfort can be eased with sips of ice water.  Medications for discomfort may or may not be needed. SEEK IMMEDIATE MEDICAL CARE IF:   You begin vomiting up blood.  You develop black, tarry stools.  You develop chills or an unexplained temperature of over 101F (38.3C)  You develop chest or abdominal pain.  You develop shortness of breath, or feel light-headed or faint.  Your swallowing is becoming more painful, difficult, or you are unable to swallow. MAKE SURE YOU:   Understand these instructions.  Will watch your condition.  Will get help right away if you are not doing well or get worse. Document Released: 09/08/2005 Document Revised: 12/02/2013 Document Reviewed: 10/26/2005 [facility] Patient Information 2015 [place], [place]. This information is not intended to replace advice given to you by your health care provider. Make sure you discuss any questions you have with your health care  provider. Esophagogastroduodenoscopy Esophagogastroduodenoscopy (EGD) is a procedure to examine the lining of the esophagus, stomach, and first part of the small intestine (duodenum). A long, flexible, lighted tube with a camera attached (endoscope) is inserted down the throat to view these organs. This procedure is done to detect problems or abnormalities, such as inflammation, bleeding, ulcers, or growths, in order to treat them. The procedure lasts about 5-20 minutes. It is usually an outpatient procedure, but it may need to be performed in emergency cases in the hospital. LET YOUR CAREGIVER KNOW ABOUT:   Allergies to food or medicine.  All medicines you are taking, including vitamins, herbs, eyedrops, and over-the-counter medicines and creams.  Use of steroids (by mouth or creams).  Previous problems you or members of your family have had with the use of anesthetics.  Any blood disorders you have.  Previous surgeries you have had.  Other health problems you have.  Possibility of pregnancy, if this applies. RISKS AND COMPLICATIONS  Generally, EGD is a safe procedure. However,  as with any procedure, complications can occur. Possible complications include:  Infection.  Bleeding.  Tearing (perforation) of the esophagus, stomach, or duodenum.  Difficulty breathing or not being able to breath.  Excessive sweating.  Spasms of the larynx.  Slowed heartbeat.  Low blood pressure. BEFORE THE PROCEDURE  Do not eat or drink anything for 6-8 hours before the procedure or as directed by your caregiver.  Ask your caregiver about changing or stopping your regular medicines.  If you wear dentures, be prepared to remove them before the procedure.  Arrange for someone to drive you home after the procedure. PROCEDURE   A vein will be accessed to give medicines and fluids. A medicine to relax you (sedative) and a pain reliever will be given through that access into the vein.  A  numbing medicine (local anesthetic) may be sprayed on your throat for comfort and to stop you from gagging or coughing.  A mouth guard may be placed in your mouth to protect your teeth and to keep you from biting on the endoscope.  You will be asked to lie on your left side.  The endoscope is inserted down your throat and into the esophagus, stomach, and duodenum.  Air is put through the endoscope to allow your caregiver to view the lining of your esophagus clearly.  The esophagus, stomach, and duodenum is then examined. During the exam, your caregiver may:  Remove tissue to be examined under a microscope (biopsy) for inflammation, infection, or other medical problems.  Remove growths.  Remove objects (foreign bodies) that are stuck.  Treat any bleeding with medicines or other devices that stop tissues from bleeding (hot cautery, clipping devices).  Widen (dilate) or stretch narrowed areas of the esophagus and stomach.  The endoscope will then be withdrawn. AFTER THE PROCEDURE  You will be taken to a recovery area to be monitored. You will be able to go home once you are stable and alert.  Do not eat or drink anything until the local anesthetic and numbing medicines have worn off. You may choke.  It is normal to feel bloated, have pain with swallowing, or have a sore throat for a short time. This will wear off.  Your caregiver should be able to discuss his or her findings with you. It will take longer to discuss the test results if any biopsies were taken. Document Released: 11/18/2004 Document Revised: 12/02/2013 Document Reviewed: 06/20/2012 [facility] Patient Information 2015 [place], [place]. This information is not intended to replace advice given to you by your health care provider. Make sure you discuss any questions you have with your health care provider. Colonoscopy A colonoscopy is an exam to look at the entire large intestine (colon). This exam can help find problems such  as tumors, polyps, inflammation, and areas of bleeding. The exam takes about 1 hour.  LET [facility] CARE PROVIDER KNOW ABOUT:   Any allergies you have.  All medicines you are taking, including vitamins, herbs, eye drops, creams, and over-the-counter medicines.  Previous problems you or members of your family have had with the use of anesthetics.  Any blood disorders you have.  Previous surgeries you have had.  Medical conditions you have. RISKS AND COMPLICATIONS  Generally, this is a safe procedure. However, as with any procedure, complications can occur. Possible complications include:  Bleeding.  Tearing or rupture of the colon wall.  Reaction to medicines given during the exam.  Infection (rare). BEFORE THE PROCEDURE   Ask your health care  provider about changing or stopping your regular medicines.  You may be prescribed an oral bowel prep. This involves drinking a large amount of medicated liquid, starting the day before your procedure. The liquid will cause you to have multiple loose stools until your stool is almost clear or light green. This cleans out your colon in preparation for the procedure.  Do not eat or drink anything else once you have started the bowel prep, unless your health care provider tells you it is safe to do so.  Arrange for someone to drive you home after the procedure. PROCEDURE   You will be given medicine to help you relax (sedative).  You will lie on your side with your knees bent.  A long, flexible tube with a light and camera on the end (colonoscope) will be inserted through the rectum and into the colon. The camera sends video back to a computer screen as it moves through the colon. The colonoscope also releases carbon dioxide gas to inflate the colon. This helps your health care provider see the area better.  During the exam, your health care provider may take a small tissue sample (biopsy) to be examined under a microscope if any  abnormalities are found.  The exam is finished when the entire colon has been viewed. AFTER THE PROCEDURE   Do not drive for 24 hours after the exam.  You may have a small amount of blood in your stool.  You may pass moderate amounts of gas and have mild abdominal cramping or bloating. This is caused by the gas used to inflate your colon during the exam.  Ask when your test results will be ready and how you will get your results. Make sure you get your test results. Document Released: 07/15/2000 Document Revised: 05/08/2013 Document Reviewed: 03/25/2013 [facility] Patient Information 2015 [place], [place]. This information is not intended to replace advice given to you by your health care provider. Make sure you discuss any questions you have with your health care provider.

## 2015-05-04 ENCOUNTER — Encounter (HOSPITAL_COMMUNITY): Payer: Self-pay

## 2015-05-04 ENCOUNTER — Encounter (HOSPITAL_COMMUNITY)
Admission: RE | Admit: 2015-05-04 | Discharge: 2015-05-04 | Disposition: A | Discharge status: Home / Self Care | Payer: Medicaid Other | Source: Ambulatory Visit | Attending: Internal Medicine | Admitting: Internal Medicine

## 2015-05-04 ENCOUNTER — Other Ambulatory Visit: Payer: Self-pay

## 2015-05-04 DIAGNOSIS — Z01818 Encounter for other preprocedural examination: Secondary | ICD-10-CM | POA: Insufficient documentation

## 2015-05-04 DIAGNOSIS — R1310 Dysphagia, unspecified: Secondary | ICD-10-CM | POA: Insufficient documentation

## 2015-05-04 HISTORY — DX: Polyneuropathy, unspecified: G62.9

## 2015-05-04 HISTORY — DX: Personal history of other diseases of the musculoskeletal system and connective tissue: Z87.39

## 2015-05-04 LAB — BASIC METABOLIC PANEL
ANION GAP: 3 — AB (ref 5–15)
BUN: 17 mg/dL (ref 6–20)
CHLORIDE: 105 mmol/L (ref 101–111)
CO2: 28 mmol/L (ref 22–32)
Calcium: 8 mg/dL — ABNORMAL LOW (ref 8.9–10.3)
Creatinine, Ser: 0.9 mg/dL (ref 0.61–1.24)
GFR calc Af Amer: >60 mL/min (ref >60)
Glucose, Bld: 256 mg/dL — ABNORMAL HIGH (ref 65–99)
Potassium: 4.4 mmol/L (ref 3.5–5.1)
SODIUM: 136 mmol/L (ref 135–145)

## 2015-05-04 LAB — CBC
HCT: 41 % (ref 39.0–52.0)
HEMOGLOBIN: 14.6 g/dL (ref 13.0–17.0)
MCH: 31.9 pg (ref 26.0–34.0)
MCHC: 35.6 g/dL (ref 30.0–36.0)
MCV: 89.5 fL (ref 78.0–100.0)
PLATELETS: 152 10*3/uL (ref 150–400)
RBC: 4.58 MIL/uL (ref 4.22–5.81)
RDW: 12.2 % (ref 11.5–15.5)
WBC: 5.2 10*3/uL (ref 4.0–10.5)

## 2015-05-04 NOTE — Pre-Procedure Instructions (Signed)
Melvin Taylor, Ph tech, calls [name] in [place] and they deferred her to their pharmacy in [place]. The [facility] states they have no patient by that name and that DOB in their database.

## 2015-05-04 NOTE — Pre-Procedure Instructions (Signed)
Patient given information to sign up for my chart at home. 

## 2015-05-04 NOTE — Pre-Procedure Instructions (Signed)
Patient in for PAT. Pharmacy tech has in notes that according to [facility]'s information, some of patient meds have not been filled since Dec of 2015. Most recent Rx was refilled in 01/2015 and that was his 2 seizure meds and were only for a 30 day supply. Patient insists that he uses no other pharmacy and that he is taking his medications. [facility] back and they did not change their initial statement. Patient was instructed to bring his medication bottles in so that we could get his med rec completed and know what he is taking. As patient was finishing PAT, he states, "oh, yeah, I am getting some of my medications from [facility] in [place]". Kimilee Ph tech to see if she can track this down and talk to doctor.

## 2015-05-07 ENCOUNTER — Ambulatory Visit (HOSPITAL_COMMUNITY): Payer: Medicaid Other | Admitting: Anesthesiology

## 2015-05-07 ENCOUNTER — Encounter (HOSPITAL_COMMUNITY): Payer: Self-pay | Admitting: *Deleted

## 2015-05-07 ENCOUNTER — Encounter (HOSPITAL_COMMUNITY)
Admission: RE | Disposition: A | Discharge status: Home Health | Payer: Self-pay | Source: Ambulatory Visit | Attending: Internal Medicine

## 2015-05-07 ENCOUNTER — Ambulatory Visit (HOSPITAL_COMMUNITY)
Admission: RE | Admit: 2015-05-07 | Discharge: 2015-05-07 | Disposition: A | Discharge status: Home Health | Payer: Medicaid Other | Source: Ambulatory Visit | Attending: Internal Medicine | Admitting: Internal Medicine

## 2015-05-07 DIAGNOSIS — Z86010 Personal history of colonic polyps: Secondary | ICD-10-CM | POA: Diagnosis not present

## 2015-05-07 DIAGNOSIS — Z1211 Encounter for screening for malignant neoplasm of colon: Secondary | ICD-10-CM | POA: Diagnosis not present

## 2015-05-07 DIAGNOSIS — G4730 Sleep apnea, unspecified: Secondary | ICD-10-CM | POA: Diagnosis not present

## 2015-05-07 DIAGNOSIS — Z79899 Other long term (current) drug therapy: Secondary | ICD-10-CM | POA: Diagnosis not present

## 2015-05-07 DIAGNOSIS — F209 Schizophrenia, unspecified: Secondary | ICD-10-CM | POA: Diagnosis not present

## 2015-05-07 DIAGNOSIS — D123 Benign neoplasm of transverse colon: Secondary | ICD-10-CM | POA: Diagnosis not present

## 2015-05-07 DIAGNOSIS — K5730 Diverticulosis of large intestine without perforation or abscess without bleeding: Secondary | ICD-10-CM | POA: Insufficient documentation

## 2015-05-07 DIAGNOSIS — F17210 Nicotine dependence, cigarettes, uncomplicated: Secondary | ICD-10-CM | POA: Diagnosis not present

## 2015-05-07 DIAGNOSIS — D128 Benign neoplasm of rectum: Secondary | ICD-10-CM | POA: Diagnosis not present

## 2015-05-07 DIAGNOSIS — K2950 Unspecified chronic gastritis without bleeding: Secondary | ICD-10-CM | POA: Insufficient documentation

## 2015-05-07 DIAGNOSIS — R634 Abnormal weight loss: Secondary | ICD-10-CM | POA: Diagnosis not present

## 2015-05-07 DIAGNOSIS — E119 Type 2 diabetes mellitus without complications: Secondary | ICD-10-CM | POA: Diagnosis not present

## 2015-05-07 DIAGNOSIS — F418 Other specified anxiety disorders: Secondary | ICD-10-CM | POA: Insufficient documentation

## 2015-05-07 DIAGNOSIS — Z794 Long term (current) use of insulin: Secondary | ICD-10-CM | POA: Insufficient documentation

## 2015-05-07 DIAGNOSIS — F3189 Other bipolar disorder: Secondary | ICD-10-CM | POA: Diagnosis not present

## 2015-05-07 DIAGNOSIS — R569 Unspecified convulsions: Secondary | ICD-10-CM | POA: Diagnosis not present

## 2015-05-07 DIAGNOSIS — K219 Gastro-esophageal reflux disease without esophagitis: Secondary | ICD-10-CM | POA: Insufficient documentation

## 2015-05-07 DIAGNOSIS — I10 Essential (primary) hypertension: Secondary | ICD-10-CM | POA: Diagnosis not present

## 2015-05-07 DIAGNOSIS — K3189 Other diseases of stomach and duodenum: Secondary | ICD-10-CM | POA: Diagnosis not present

## 2015-05-07 DIAGNOSIS — R1310 Dysphagia, unspecified: Secondary | ICD-10-CM

## 2015-05-07 HISTORY — PX: ESOPHAGOGASTRODUODENOSCOPY (EGD) WITH PROPOFOL: SHX5813

## 2015-05-07 HISTORY — PX: ESOPHAGEAL DILATION: SHX303

## 2015-05-07 HISTORY — PX: POLYPECTOMY: SHX5525

## 2015-05-07 HISTORY — PX: BIOPSY: SHX5522

## 2015-05-07 HISTORY — PX: COLONOSCOPY WITH PROPOFOL: SHX5780

## 2015-05-07 LAB — GLUCOSE, CAPILLARY
GLUCOSE-CAPILLARY: 174 mg/dL — AB (ref 65–99)
Glucose-Capillary: 110 mg/dL — ABNORMAL HIGH (ref 65–99)

## 2015-05-07 SURGERY — COLONOSCOPY WITH PROPOFOL
Anesthesia: Monitor Anesthesia Care

## 2015-05-07 MED ORDER — MIDAZOLAM HCL 5 MG/5ML IJ SOLN: INTRAMUSCULAR | Status: DC | PRN

## 2015-05-07 MED ORDER — LIDOCAINE VISCOUS 2 % MT SOLN
3.0000 mL | Freq: Once | OROMUCOSAL | Status: AC
  Administered 2015-05-07: 3 mL via OROMUCOSAL
  Filled 2015-05-07: qty 15

## 2015-05-07 MED ORDER — LACTATED RINGERS IV SOLN
INTRAVENOUS | Status: DC
  Administered 2015-05-07: 10:00:00 via INTRAVENOUS

## 2015-05-07 MED ORDER — MIDAZOLAM HCL 5 MG/5ML IJ SOLN
INTRAMUSCULAR | Status: DC | PRN
  Administered 2015-05-07 (×2): 1 mg via INTRAVENOUS

## 2015-05-07 MED ORDER — STERILE WATER FOR IRRIGATION IR SOLN
Status: DC | PRN
  Administered 2015-05-07: 1000 mL

## 2015-05-07 MED ORDER — PROPOFOL 500 MG/50ML IV EMUL
INTRAVENOUS | Status: DC | PRN
  Administered 2015-05-07: 125 ug/kg/min via INTRAVENOUS
  Administered 2015-05-07: 11:00:00 via INTRAVENOUS

## 2015-05-07 MED ORDER — PROPOFOL 10 MG/ML IV BOLUS
INTRAVENOUS | Status: DC | PRN
  Administered 2015-05-07: 10 mg via INTRAVENOUS

## 2015-05-07 MED ORDER — FENTANYL CITRATE (PF) 100 MCG/2ML IJ SOLN: 25.0000 ug | INTRAMUSCULAR | Status: DC | PRN

## 2015-05-07 MED ORDER — PROPOFOL 10 MG/ML IV BOLUS
INTRAVENOUS | Status: AC
  Filled 2015-05-07: qty 20

## 2015-05-07 MED ORDER — FENTANYL CITRATE (PF) 100 MCG/2ML IJ SOLN
25.0000 ug | INTRAMUSCULAR | Status: AC
Start: 2015-05-07 — End: 2015-05-07
  Administered 2015-05-07 (×2): 25 ug via INTRAVENOUS

## 2015-05-07 MED ORDER — MIDAZOLAM HCL 2 MG/2ML IJ SOLN
INTRAMUSCULAR | Status: AC
  Filled 2015-05-07: qty 4

## 2015-05-07 MED ORDER — GLYCOPYRROLATE 0.2 MG/ML IJ SOLN
INTRAMUSCULAR | Status: AC
  Filled 2015-05-07: qty 1

## 2015-05-07 MED ORDER — FENTANYL CITRATE (PF) 100 MCG/2ML IJ SOLN
INTRAMUSCULAR | Status: AC
  Filled 2015-05-07: qty 2

## 2015-05-07 MED ORDER — MIDAZOLAM HCL 2 MG/2ML IJ SOLN
1.0000 mg | INTRAMUSCULAR | Status: DC | PRN
  Administered 2015-05-07: 2 mg via INTRAVENOUS

## 2015-05-07 MED ORDER — MIDAZOLAM HCL 2 MG/2ML IJ SOLN
INTRAMUSCULAR | Status: AC
  Filled 2015-05-07: qty 2

## 2015-05-07 MED ORDER — ONDANSETRON HCL 4 MG/2ML IJ SOLN
4.0000 mg | Freq: Once | INTRAMUSCULAR | Status: DC | PRN
Start: 2015-05-07 — End: 2015-05-07

## 2015-05-07 MED ORDER — ONDANSETRON HCL 4 MG/2ML IJ SOLN
4.0000 mg | Freq: Once | INTRAMUSCULAR | Status: AC
  Administered 2015-05-07: 4 mg via INTRAVENOUS

## 2015-05-07 MED ORDER — ONDANSETRON HCL 4 MG/2ML IJ SOLN
INTRAMUSCULAR | Status: AC
  Filled 2015-05-07: qty 2

## 2015-05-07 MED ORDER — GLYCOPYRROLATE 0.2 MG/ML IJ SOLN
0.2000 mg | Freq: Once | INTRAMUSCULAR | Status: AC
  Administered 2015-05-07: 0.2 mg via INTRAVENOUS

## 2015-05-07 SURGICAL SUPPLY — 10 items
BLOCK BITE 60FR ADLT L/F BLUE (MISCELLANEOUS) ×1 IMPLANT
FORCEPS BIOP RAD 4 LRG CAP 4 (CUTTING FORCEPS) ×1 IMPLANT
FORMALIN 10 PREFIL 20ML (MISCELLANEOUS) ×4 IMPLANT
KIT ENDO PROCEDURE PEN (KITS) ×4 IMPLANT
MANIFOLD NEPTUNE II (INSTRUMENTS) ×1 IMPLANT
SNARE ROTATE MED OVAL 20MM (MISCELLANEOUS) ×2 IMPLANT
SYR 50ML LL SCALE MARK (SYRINGE) ×1 IMPLANT
TRAP SPECIMEN MUCOUS 40CC (MISCELLANEOUS) ×2 IMPLANT
TUBING IRRIGATION ENDOGATOR (MISCELLANEOUS) ×1 IMPLANT
WATER STERILE IRR 1000ML POUR (IV SOLUTION) ×1 IMPLANT

## 2015-05-07 NOTE — Anesthesia Preprocedure Evaluation (Signed)
Anesthesia Evaluation  Patient identified by MRN, date of birth, ID band Patient awake    Reviewed: Allergy & Precautions, NPO status , Patient's Chart, lab work & pertinent test results, reviewed documented beta blocker date and time   Airway Mallampati: I  TM Distance: >3 FB     Dental  (+) Poor Dentition, Missing, Dental Advisory Given, Chipped   Pulmonary sleep apnea , Current Smoker,    breath sounds clear to auscultation       Cardiovascular hypertension, Pt. on medications and Pt. on home beta blockers  Rhythm:Regular Rate:Normal     Neuro/Psych  Headaches, Seizures -,  PSYCHIATRIC DISORDERS Anxiety Depression Bipolar Disorder Schizophrenia    GI/Hepatic Dysphagia    Endo/Other  diabetes, Type 2, Insulin Dependent  Renal/GU      Musculoskeletal   Abdominal   Peds  Hematology   Anesthesia Other Findings   Reproductive/Obstetrics                             Anesthesia Physical Anesthesia Plan  ASA: III  Anesthesia Plan: MAC   Post-op Pain Management:    Induction: Intravenous  Airway Management Planned: Simple Face Mask  Additional Equipment:   Intra-op Plan:   Post-operative Plan:   Informed Consent: I have reviewed the patients History and Physical, chart, labs and discussed the procedure including the risks, benefits and alternatives for the proposed anesthesia with the patient or authorized representative who has indicated his/her understanding and acceptance.     Plan Discussed with:   Anesthesia Plan Comments:         Anesthesia Quick Evaluation

## 2015-05-07 NOTE — Op Note (Signed)
[facility] [address] [place], [address]   COLONOSCOPY PROCEDURE REPORT  PATIENT: Melvin Taylor, Melvin Taylor  MR#: [id number] BIRTHDATE: 1966-01-11 , 49  yrs. old GENDER: male ENDOSCOPIST: [name], Physician REFERRED QI:[id number] Fanta, M.D. PROCEDURE DATE:  05/26/2015 PROCEDURE:   Ileo-colonoscopy with snare polypectomy INDICATIONS:colorectal cancer screening examination. MEDICATIONS: Deep sedation per Dr.  [facility] ASA CLASS:       Class II  CONSENT: The risks, benefits, alternatives and imponderables including but not limited to bleeding, perforation as well as the possibility of a missed lesion have been reviewed.  The potential for biopsy, lesion removal, etc. have also been discussed. Questions have been answered.  All parties agreeable.  Please see the history and physical in the medical record for more information.  DESCRIPTION OF PROCEDURE:   After the risks benefits and alternatives of the procedure were thoroughly explained, informed consent was obtained.  The digital rectal exam revealed no abnormalities of the rectum.   The     endoscope was introduced through the anus and advanced to the terminal ileum which was intubated for a short distance. No adverse events experienced. The quality of the prep was adequate  The instrument was then slowly withdrawn as the colon was fully examined. Estimated blood loss is zero unless otherwise noted in this procedure report.      COLON FINDINGS: (1) 5 mm sessile polyp in the rectum at 5 cm from the anal verge; otherwise, the remainder of the rectal mucosa appeared normal.  Scattered pancolonic diverticula; (1) 1 cm polyp at the hepatic flexure; the patient had (2) polyps one 6 and 3 mm pedunculated polyps, respectively in the distal transverse segment; otherwise, the remainder of the colonic mucosa appeared normal.The distal 5 cm of terminal ileum mucosa also appeared normal.  The rectal  polyp was cold snare removed.  The hepatic flexure polyp was hot snare removed.  The distal transverse colon polyp cold snare removed.  The smaller of the 2 distal transverse colon polyps not survive processing.     .  Withdrawal time=20 minutes 0 seconds.  The scope was withdrawn and the procedure completed. COMPLICATIONS: There were no immediate complications.  ENDOSCOPIC IMPRESSION: Pancolonic diverticulosis. Multiple colonic and rectal polyps?"removed as described above  RECOMMENDATIONS: Follow up on pathology report. See EGD report.  eSigned:  [name], Physician 05-26-15 11:28 AM   cc:  CPT CODES: ICD CODES:  The ICD and CPT codes recommended by this software are interpretations from the data that the clinical staff has captured with the software.  The verification of the translation of this report to the ICD and CPT codes and modifiers is the sole responsibility of the health care institution and practicing physician where this report was generated.  [employer] will not be held responsible for the validity of the ICD and CPT codes included on this report.  AMA assumes no liability for data contained or not contained herein. CPT is a Designer, television/film set of the [employer].  PATIENT NAME:  [name], [name] MR#: [id number]

## 2015-05-07 NOTE — Discharge Instructions (Addendum)
Colonoscopy Discharge Instructions  Read the instructions outlined below and refer to this sheet in the next few weeks. These discharge instructions provide you with general information on caring for yourself after you leave the hospital. Your doctor may also give you specific instructions. While your treatment has been planned according to the most current medical practices available, unavoidable complications occasionally occur. If you have any problems or questions after discharge, call Dr. Gala Romney at [phone]. ACTIVITY  You may resume your regular activity, but move at a slower pace for the next 24 hours.   Take frequent rest periods for the next 24 hours.   Walking will help get rid of the air and reduce the bloated feeling in your belly (abdomen).   No driving for 24 hours (because of the medicine (anesthesia) used during the test).    Do not sign any important legal documents or operate any machinery for 24 hours (because of the anesthesia used during the test).  NUTRITION  Drink plenty of fluids.   You may resume your normal diet as instructed by your doctor.   Begin with a light meal and progress to your normal diet. Heavy or fried foods are harder to digest and may make you feel sick to your stomach (nauseated).   Avoid alcoholic beverages for 24 hours or as instructed.  MEDICATIONS  You may resume your normal medications unless your doctor tells you otherwise.  WHAT YOU CAN EXPECT TODAY  Some feelings of bloating in the abdomen.   Passage of more gas than usual.   Spotting of blood in your stool or on the toilet paper.  IF YOU HAD POLYPS REMOVED DURING THE COLONOSCOPY:  No aspirin products for 7 days or as instructed.   No alcohol for 7 days or as instructed.   Eat a soft diet for the next 24 hours.  FINDING OUT THE RESULTS OF YOUR TEST Not all test results are available during your visit. If your test results are not back during the visit, make an appointment  with your caregiver to find out the results. Do not assume everything is normal if you have not heard from your caregiver or the medical facility. It is important for you to follow up on all of your test results.  SEEK IMMEDIATE MEDICAL ATTENTION IF:  You have more than a spotting of blood in your stool.   Your belly is swollen (abdominal distention).   You are nauseated or vomiting.   You have a temperature over 101.  You have abdominal pain or discomfort that is severe or gets worse throughout the day. EGD Discharge instructions Please read the instructions outlined below and refer to this sheet in the next few weeks. These discharge instructions provide you with general information on caring for yourself after you leave the hospital. Your doctor may also give you specific instructions. While your treatment has been planned according to the most current medical practices available, unavoidable complications occasionally occur. If you have any problems or questions after discharge, please call your doctor. ACTIVITY You may resume your regular activity but move at a slower pace for the next 24 hours.  Take frequent rest periods for the next 24 hours.  Walking will help expel (get rid of) the air and reduce the bloated feeling in your abdomen.  No driving for 24 hours (because of the anesthesia (medicine) used during the test).  You may shower.  Do not sign any important legal documents or operate any machinery for 24  hours (because of the anesthesia used during the test).  NUTRITION Drink plenty of fluids.  You may resume your normal diet.  Begin with a light meal and progress to your normal diet.  Avoid alcoholic beverages for 24 hours or as instructed by your caregiver.  MEDICATIONS You may resume your normal medications unless your caregiver tells you otherwise.  WHAT YOU CAN EXPECT TODAY You may experience abdominal discomfort such as a feeling of fullness or gas pains.   FOLLOW-UP Your doctor will discuss the results of your test with you.  SEEK IMMEDIATE MEDICAL ATTENTION IF ANY OF THE FOLLOWING OCCUR: Excessive nausea (feeling sick to your stomach) and/or vomiting.  Severe abdominal pain and distention (swelling).  Trouble swallowing.  Temperature over 101 F (37.8 C).  Rectal bleeding or vomiting of blood.     Diverticulosis and colonic polyp information provided  Further recommendations to follow pending review of pathology report  Diverticulosis Diverticulosis is the condition that develops when small pouches (diverticula) form in the wall of your colon. Your colon, or large intestine, is where water is absorbed and stool is formed. The pouches form when the inside layer of your colon pushes through weak spots in the outer layers of your colon. CAUSES  No one knows exactly what causes diverticulosis. RISK FACTORS  Being older than 35. Your risk for this condition increases with age. Diverticulosis is rare in people younger than 40 years. By age 29, almost everyone has it.  Eating a low-fiber diet.  Being frequently constipated.  Being overweight.  Not getting enough exercise.  Smoking.  Taking over-the-counter pain medicines, like aspirin and ibuprofen. SYMPTOMS  Most people with diverticulosis do not have symptoms. DIAGNOSIS  Because diverticulosis often has no symptoms, health care providers often discover the condition during an exam for other colon problems. In many cases, a health care provider will diagnose diverticulosis while using a flexible scope to examine the colon (colonoscopy). TREATMENT  If you have never developed an infection related to diverticulosis, you may not need treatment. If you have had an infection before, treatment may include:  Eating more fruits, vegetables, and grains.  Taking a fiber supplement.  Taking a live bacteria supplement (probiotic).  Taking medicine to relax your colon. HOME CARE  INSTRUCTIONS   Drink at least 6-8 glasses of water each day to prevent constipation.  Try not to strain when you have a bowel movement.  Keep all follow-up appointments. If you have had an infection before:  Increase the fiber in your diet as directed by your health care provider or dietitian.  Take a dietary fiber supplement if your health care provider approves.  Only take medicines as directed by your health care provider. SEEK MEDICAL CARE IF:   You have abdominal pain.  You have bloating.  You have cramps.  You have not gone to the bathroom in 3 days. SEEK IMMEDIATE MEDICAL CARE IF:   Your pain gets worse.  Yourbloating becomes very bad.  You have a fever or chills, and your symptoms suddenly get worse.  You begin vomiting.  You have bowel movements that are bloody or black. MAKE SURE YOU:  Understand these instructions.  Will watch your condition.  Will get help right away if you are not doing well or get worse.   This information is not intended to replace advice given to you by your health care provider. Make sure you discuss any questions you have with your health care provider.   Colon Polyps  Polyps are lumps of extra tissue growing inside the body. Polyps can grow in the large intestine (colon). Most colon polyps are noncancerous (benign). However, some colon polyps can become cancerous over time. Polyps that are larger than a pea may be harmful. To be safe, caregivers remove and test all polyps. CAUSES  Polyps form when mutations in the genes cause your cells to grow and divide even though no more tissue is needed. RISK FACTORS There are a number of risk factors that can increase your chances of getting colon polyps. They include:  Being older than 50 years.  Family history of colon polyps or colon cancer.  Long-term colon diseases, such as colitis or Crohn disease.  Being overweight.  Smoking.  Being inactive.  Drinking too much  alcohol. SYMPTOMS  Most small polyps do not cause symptoms. If symptoms are present, they may include:  Blood in the stool. The stool may look dark red or black.  Constipation or diarrhea that lasts longer than 1 week. DIAGNOSIS People often do not know they have polyps until their caregiver finds them during a regular checkup. Your caregiver can use 4 tests to check for polyps:  Digital rectal exam. The caregiver wears gloves and feels inside the rectum. This test would find polyps only in the rectum.  Barium enema. The caregiver puts a liquid called barium into your rectum before taking X-rays of your colon. Barium makes your colon look white. Polyps are dark, so they are easy to see in the X-ray pictures.  Sigmoidoscopy. A thin, flexible tube (sigmoidoscope) is placed into your rectum. The sigmoidoscope has a light and tiny camera in it. The caregiver uses the sigmoidoscope to look at the last third of your colon.  Colonoscopy. This test is like sigmoidoscopy, but the caregiver looks at the entire colon. This is the most common method for finding and removing polyps. TREATMENT  Any polyps will be removed during a sigmoidoscopy or colonoscopy. The polyps are then tested for cancer. PREVENTION  To help lower your risk of getting more colon polyps:  Eat plenty of fruits and vegetables. Avoid eating fatty foods.  Do not smoke.  Avoid drinking alcohol.  Exercise every day.  Lose weight if recommended by your caregiver.  Eat plenty of calcium and folate. Foods that are rich in calcium include milk, cheese, and broccoli. Foods that are rich in folate include chickpeas, kidney beans, and spinach. HOME CARE INSTRUCTIONS Keep all follow-up appointments as directed by your caregiver. You may need periodic exams to check for polyps. SEEK MEDICAL CARE IF: You notice bleeding during a bowel movement.   This information is not intended to replace advice given to you by your health care  provider. Make sure you discuss any questions you have with your health care provider.

## 2015-05-07 NOTE — Op Note (Signed)
[facility] [address] [place], [address]   ENDOSCOPY PROCEDURE REPORT  PATIENT: Melvin Taylor, Melvin Taylor  MR#: [id number] BIRTHDATE: Nov 30, 1965 , 49  yrs. old GENDER: male ENDOSCOPIST: [name], Physician REFERRED BY:  Conni Slipper, M.D. PROCEDURE DATE:  05/29/2015 PROCEDURE:  EGD with biopsy andMaloney dilation of esophagus INDICATIONS:  dysphagia; GERD; weight loss. MEDICATIONS: Deep sedation per Dr.  [facility] ASA CLASS:      Class III  CONSENT: The risks, benefits, limitations, alternatives and imponderables have been discussed.  The potential for biopsy, esophogeal dilation, etc. have also been reviewed.  Questions have been answered.  All parties agreeable.  Please see the history and physical in the medical record for more information.  DESCRIPTION OF PROCEDURE: After the risks benefits and alternatives of the procedure were thoroughly explained, informed consent was obtained.  The    endoscope was introduced through the mouth and advanced to the second portion of the duodenum , limited by Without limitations. The instrument was slowly withdrawn as the mucosa was fully examined. Estimated blood loss is zero unless otherwise noted in this procedure report.    Normal-appearing, patent tubular esophagus.  Stomach empty.  Patient had marked injection and erythema with superficial erosions throughout the gastric mucosa.  No ulcer or infiltrating process or other abnormality.  Patent pylorus.  Bulbar erosions present; otherwise, the first and second portion of the duodenum appeared normal.  A 56 French Maloney dilator was passed to full insertion easily.  A look back revealed no apparent complication related to this maneuver.  Subsequently, biopsies of abnormal stomach taken for histologic study.  Retroflexed views revealed as previously described.     The scope was then withdrawn from the patient and the procedure  completed.  COMPLICATIONS: There were no immediate complications. EBL 3 mL ENDOSCOPIC IMPRESSION: Normal esophagus?"status post Maloney dilation. Abnormal gastric mucosa of uncertain significance?"status post biopsy. Bulbar erosions.  RECOMMENDATIONS: Follow-up on pathology. Further recommendations to follow.  REPEAT EXAM:  eSigned:  [name], Physician 29-May-2015 11:23 AM    CC:  CPT CODES: ICD CODES:  The ICD and CPT codes recommended by this software are interpretations from the data that the clinical staff has captured with the software.  The verification of the translation of this report to the ICD and CPT codes and modifiers is the sole responsibility of the health care institution and practicing physician where this report was generated.  [employer] will not be held responsible for the validity of the ICD and CPT codes included on this report.  AMA assumes no liability for data contained or not contained herein. CPT is a Designer, television/film set of the [employer].

## 2015-05-07 NOTE — Interval H&P Note (Signed)
History and Physical Interval Note:  05/07/2015 10:23 AM  Lendon Ka Freeney  has presented today for surgery, with the diagnosis of SCREENING/DYSPHAGIA  The various methods of treatment have been discussed with the patient and family. After consideration of risks, benefits and other options for treatment, the patient has consented to  Procedure(s): COLONOSCOPY WITH PROPOFOL  (N/A) ESOPHAGOGASTRODUODENOSCOPY (EGD) WITH PROPOFOL (N/A) ESOPHAGEAL DILATION (N/A) as a surgical intervention .  The patient's history has been reviewed, patient examined, no change in status, stable for surgery.  I have reviewed the patient's chart and labs.  Questions were answered to the patient's satisfaction.     Roberta Kelly  No change. EGD with EGD as appropriate and colonoscopy per plan.  The risks, benefits, limitations, imponderables and alternatives regarding both EGD and colonoscopy have been reviewed with the patient. Questions have been answered. All parties agreeable.

## 2015-05-07 NOTE — Anesthesia Postprocedure Evaluation (Signed)
  Anesthesia Post-op Note  Patient: Melvin Taylor  Procedure(s) Performed: Procedure(s): COLONOSCOPY WITH PROPOFOL at cecum 1055, withdrawal time=20 minutes (N/A) ESOPHAGOGASTRODUODENOSCOPY (EGD) WITH PROPOFOL (Procedure 1) (N/A) ESOPHAGEAL DILATION WITH 56FR MALONEY DILATOR (N/A) BIOPSY (Gastric) POLYPECTOMY (Hepatic Flexure, Distal Transverse Colon, Rectal)  Patient Location: PACU  Anesthesia Type:MAC  Level of Consciousness: awake and alert   Airway and Oxygen Therapy: Patient Spontanous Breathing  Post-op Pain: none  Post-op Assessment: Post-op Vital signs reviewed, Patient's Cardiovascular Status Stable, Respiratory Function Stable, Patent Airway and No signs of Nausea or vomiting              Post-op Vital Signs: Reviewed and stable  Last Vitals:  Filed Vitals:   05/07/15 1025  BP: 133/86  Pulse:   Temp:   Resp: 13    Complications: No apparent anesthesia complications

## 2015-05-07 NOTE — Transfer of Care (Signed)
Immediate Anesthesia Transfer of Care Note  Patient: Melvin Taylor  Procedure(s) Performed: Procedure(s): COLONOSCOPY WITH PROPOFOL at cecum 1055, withdrawal time=20 minutes (N/A) ESOPHAGOGASTRODUODENOSCOPY (EGD) WITH PROPOFOL (Procedure 1) (N/A) ESOPHAGEAL DILATION WITH 56FR MALONEY DILATOR (N/A) BIOPSY (Gastric) POLYPECTOMY (Hepatic Flexure, Distal Transverse Colon, Rectal)  Patient Location: PACU  Anesthesia Type:MAC  Level of Consciousness: awake, alert  and oriented  Airway & Oxygen Therapy: Patient Spontanous Breathing  Post-op Assessment: Report given to RN  Post vital signs: Reviewed and stable  Last Vitals:  Filed Vitals:   05/07/15 1025  BP: 133/86  Pulse:   Temp:   Resp: 13    Complications: No apparent anesthesia complications

## 2015-05-07 NOTE — H&P (View-Only) (Signed)
Primary Care Physician:  Rosita Fire, MD Primary Gastroenterologist:  Dr. Gala Romney  Chief Complaint  Patient presents with  . set up TCS    HPI:   49 year old male referred by PCP for screening colonoscopy. PCP notes reviewed. No mention of gastrointestinal symptoms at PCP visit. Ineligible for phone triage due to meds.  Today he states he has never had a colonoscopy before. Admits abdominal pain in his lower abdomen described as sharp and occurring a couple times a day which are brief and promptly self resolve. Denies N/V, change in bowel habits, hematochezia, melena. Has a bowel movement once daily which are consistent with [facility] 4. Denies fever, chills. States he has lost about 100 pounds over the past 6 months unintentionally. (Objective weight loss per system records are: 15 lb in 8 months, 33 lb in 1.5 yrs, and 60 lb in 4.5 yrs.) Has dysphagia symptoms over the past 3 years, "sometimes with every meal, I have to chew my food up good." Usually has to drink water to get solid food to pass when he is having a dysphagia episode, occasional regurgitation. Denies chest pain, dyspnea, dizziness, lightheadedness, syncope, near syncope. Denies any other upper or lower GI symptoms.  Past Medical History  Diagnosis Date  . Anxiety   . Depression   . Diabetes mellitus   . HTN (hypertension)   . Sleep apnea   . Bipolar 1 disorder   . Insomnia   . Headache(784.0)   . Schizophrenia   . Hallucinations     "long history of them"  . Seizures     last sz between July 5-9th, 2016    Past Surgical History  Procedure Laterality Date  . Mandible fracture surgery    . Knee surgery    . Tonsillectomy      Current Outpatient Prescriptions  Medication Sig Dispense Refill  . divalproex (DEPAKOTE) 500 MG DR tablet Take 2 tablets (1,000 mg total) by mouth 2 (two) times daily. 120 tablet 12  . insulin aspart (NOVOLOG FLEXPEN) 100 UNIT/ML FlexPen Inject 10 Units into the skin 3 (three) times  daily with meals.    . Insulin Glargine (LANTUS SOLOSTAR) 100 UNIT/ML Solostar Pen Inject 10 Units into the skin daily at 10 pm.     . levETIRAcetam (KEPPRA) 500 MG tablet Take 2 tablets (1,000 mg total) by mouth 2 (two) times daily. 120 tablet 12  . magic mouthwash w/lidocaine SOLN Take 5 mLs by mouth 3 (three) times daily as needed for mouth pain. Swish and spit, do not swallow 100 mL 0  . traMADol (ULTRAM) 50 MG tablet Take 1 tablet (50 mg total) by mouth every 6 (six) hours as needed. 15 tablet 0  . amLODipine (NORVASC) 10 MG tablet Take 10 mg by mouth daily.     Marland Kitchen amoxicillin (AMOXIL) 500 MG capsule Take 1 capsule (500 mg total) by mouth 3 (three) times daily. For 10 days (Patient not taking: Reported on 04/16/2015) 30 capsule 0  . atenolol (TENORMIN) 100 MG tablet Take 100 mg by mouth daily.      Marland Kitchen gabapentin (NEURONTIN) 300 MG capsule Take 1 capsule (300 mg total) by mouth at bedtime. (Patient not taking: Reported on 04/16/2015) 30 capsule 0  . hydrochlorothiazide (HYDRODIURIL) 25 MG tablet Take 25 mg by mouth daily.    Marland Kitchen ibuprofen (ADVIL,MOTRIN) 600 MG tablet Take 600 mg by mouth 3 (three) times daily as needed for mild pain.     No current facility-administered medications for this  visit.    Allergies as of 04/16/2015 - Review Complete 04/16/2015  Allergen Reaction Noted  . Aspirin Swelling 12/28/2010    Family History  Problem Relation Age of Onset  . Arthritis    . Diabetes    . Asthma    . Diabetes Father   . Hypertension Father     Social History   Social History  . Marital Status: Single    Spouse Name: N/A  . Number of Children: N/A  . Years of Education: 11th grade   Occupational History  . unemployed    Social History Main Topics  . Smoking status: Current Every Day Smoker -- 1.00 packs/day for 30 years    Types: Cigarettes  . Smokeless tobacco: Never Used  . Alcohol Use: Yes     Comment: 02/11/15 "drink a beer every now and then"  . Drug Use: No      Comment: hx of cocaine use but denies using in over a year, denies marjuana use 01/18/15 and 02/11/15  . Sexual Activity: Not Currently   Other Topics Concern  . Not on file   Social History Narrative   Lives alone   Drinks a cup of coffee a day    Review of Systems: General: Negative for anorexia, fever, chills, fatigue, weakness. Eyes: Negative for vision changes.  ENT: Negative for hoarseness. CV: Negative for chest pain, angina, palpitations, peripheral edema.  Respiratory: Negative for dyspnea at rest, cough, sputum, wheezing.  GI: See history of present illness. Derm: Negative for rash or itching.  Endo: Negative for unusual weight change.  Heme: Negative for bruising or bleeding. Allergy: Negative for rash or hives.    Physical Exam: BP 148/105 mmHg  Pulse 85  Temp(Src) 98 F (36.7 C)  Ht 5\' 7"  (1.702 m)  Wt 145 lb 9.6 oz (66.044 kg)  BMI 22.80 kg/m2 General:   Alert and oriented. Pleasant and cooperative. Well-nourished and well-developed.  Head:  Normocephalic and atraumatic. Eyes:  Without icterus, sclera clear and conjunctiva pink.  Ears:  Normal auditory acuity. Cardiovascular:  S1, S2 present without murmurs appreciated. Extremities without clubbing or edema. Respiratory:  Clear to auscultation bilaterally. No wheezes, rales, or rhonchi. No distress.  Gastrointestinal:  +BS, soft, non-tender and non-distended. No HSM noted. No guarding or rebound. No masses appreciated.  Rectal:  Deferred  Skin:  Intact without significant lesions or rashes. Neurologic:  Alert and oriented x4;  grossly normal neurologically. Psych:  Alert and cooperative. Normal mood and affect.     04/16/2015 1:43 PM

## 2015-05-08 ENCOUNTER — Encounter (HOSPITAL_COMMUNITY): Payer: Self-pay | Admitting: Internal Medicine

## 2015-05-08 ENCOUNTER — Encounter: Payer: Self-pay | Admitting: Internal Medicine

## 2015-05-11 NOTE — OR Nursing (Signed)
Patient called and stated that he had a colonoscopy and esophagogastroduodenscopy with dilation on Thursday (05/07/2015). Patient stated that he was having abdominal pain and could not eat and he was losing his voice. Patient stated the abdominal pain and him not being able to eat was like that before he had the procedures and he started losing his voice yesterday. Spoke with Dr. Gala Romney about the patient's complaints and he said to have patient see his primary care physician about his throat and the office will follow up with him about his abdominal pain. Called the patient and informed of the above. Patient verbalized understanding.

## 2015-05-12 ENCOUNTER — Telehealth: Payer: Self-pay

## 2015-05-12 NOTE — Telephone Encounter (Signed)
PATIENT HAS APPOINTMENT  °

## 2015-05-12 NOTE — Telephone Encounter (Signed)
Per RMR-  Send letter to patient.  Send copy of letter with path to referring provider and PCP.   Needs office visit with extender in one month to reassess, check weight, etc.

## 2015-05-12 NOTE — Telephone Encounter (Signed)
Letter mailed to the pt. Please schedule ov. 

## 2015-05-13 ENCOUNTER — Emergency Department (HOSPITAL_COMMUNITY)
Admission: EM | Admit: 2015-05-13 | Discharge: 2015-05-13 | Disposition: A | Discharge status: Home / Self Care | Payer: Medicaid Other | Attending: Emergency Medicine | Admitting: Emergency Medicine

## 2015-05-13 ENCOUNTER — Encounter (HOSPITAL_COMMUNITY): Payer: Self-pay | Admitting: Emergency Medicine

## 2015-05-13 ENCOUNTER — Emergency Department (HOSPITAL_COMMUNITY): Payer: Medicaid Other

## 2015-05-13 DIAGNOSIS — G40909 Epilepsy, unspecified, not intractable, without status epilepticus: Secondary | ICD-10-CM | POA: Diagnosis not present

## 2015-05-13 DIAGNOSIS — F801 Expressive language disorder: Secondary | ICD-10-CM | POA: Insufficient documentation

## 2015-05-13 DIAGNOSIS — Z8739 Personal history of other diseases of the musculoskeletal system and connective tissue: Secondary | ICD-10-CM | POA: Diagnosis not present

## 2015-05-13 DIAGNOSIS — I10 Essential (primary) hypertension: Secondary | ICD-10-CM | POA: Diagnosis not present

## 2015-05-13 DIAGNOSIS — Z79899 Other long term (current) drug therapy: Secondary | ICD-10-CM | POA: Diagnosis not present

## 2015-05-13 DIAGNOSIS — F419 Anxiety disorder, unspecified: Secondary | ICD-10-CM | POA: Diagnosis not present

## 2015-05-13 DIAGNOSIS — G629 Polyneuropathy, unspecified: Secondary | ICD-10-CM | POA: Insufficient documentation

## 2015-05-13 DIAGNOSIS — Z794 Long term (current) use of insulin: Secondary | ICD-10-CM | POA: Insufficient documentation

## 2015-05-13 DIAGNOSIS — F319 Bipolar disorder, unspecified: Secondary | ICD-10-CM | POA: Diagnosis not present

## 2015-05-13 DIAGNOSIS — R4701 Aphasia: Secondary | ICD-10-CM | POA: Diagnosis present

## 2015-05-13 DIAGNOSIS — Z720 Tobacco use: Secondary | ICD-10-CM | POA: Insufficient documentation

## 2015-05-13 DIAGNOSIS — E119 Type 2 diabetes mellitus without complications: Secondary | ICD-10-CM | POA: Diagnosis not present

## 2015-05-13 LAB — DIFFERENTIAL
Basophils Absolute: 0 10*3/uL (ref 0.0–0.1)
Basophils Relative: 1 %
EOS PCT: 2 %
Eosinophils Absolute: 0.2 10*3/uL (ref 0.0–0.7)
LYMPHS PCT: 56 %
Lymphs Abs: 3.5 10*3/uL (ref 0.7–4.0)
MONO ABS: 0.5 10*3/uL (ref 0.1–1.0)
MONOS PCT: 8 %
NEUTROS ABS: 2.1 10*3/uL (ref 1.7–7.7)
Neutrophils Relative %: 33 %

## 2015-05-13 LAB — COMPREHENSIVE METABOLIC PANEL
ALT: 15 U/L — AB (ref 17–63)
AST: 16 U/L (ref 15–41)
Albumin: 4.3 g/dL (ref 3.5–5.0)
Alkaline Phosphatase: 45 U/L (ref 38–126)
Anion gap: 8 (ref 5–15)
BUN: 14 mg/dL (ref 6–20)
CHLORIDE: 101 mmol/L (ref 101–111)
CO2: 28 mmol/L (ref 22–32)
CREATININE: 0.86 mg/dL (ref 0.61–1.24)
Calcium: 9 mg/dL (ref 8.9–10.3)
GFR calc non Af Amer: >60 mL/min (ref >60)
GLUCOSE: 250 mg/dL — AB (ref 65–99)
Potassium: 3.6 mmol/L (ref 3.5–5.1)
SODIUM: 137 mmol/L (ref 135–145)
Total Bilirubin: 1 mg/dL (ref 0.3–1.2)
Total Protein: 7.1 g/dL (ref 6.5–8.1)

## 2015-05-13 LAB — CBC
HEMATOCRIT: 45.6 % (ref 39.0–52.0)
Hemoglobin: 16.4 g/dL (ref 13.0–17.0)
MCH: 32.3 pg (ref 26.0–34.0)
MCHC: 36 g/dL (ref 30.0–36.0)
MCV: 89.8 fL (ref 78.0–100.0)
PLATELETS: 166 10*3/uL (ref 150–400)
RBC: 5.08 MIL/uL (ref 4.22–5.81)
RDW: 12.3 % (ref 11.5–15.5)
WBC: 6.3 10*3/uL (ref 4.0–10.5)

## 2015-05-13 LAB — APTT: aPTT: 30 seconds (ref 24–37)

## 2015-05-13 LAB — PROTIME-INR
INR: 1.13 (ref 0.00–1.49)
PROTHROMBIN TIME: 14.7 s (ref 11.6–15.2)

## 2015-05-13 LAB — CBG MONITORING, ED: GLUCOSE-CAPILLARY: 226 mg/dL — AB (ref 65–99)

## 2015-05-13 LAB — VALPROIC ACID LEVEL: VALPROIC ACID LVL: 50 ug/mL (ref 50.0–100.0)

## 2015-05-13 LAB — TROPONIN I: Troponin I: <0.03 ng/mL (ref <0.031)

## 2015-05-13 MED ORDER — ACETAMINOPHEN 500 MG PO TABS
1000.0000 mg | ORAL_TABLET | Freq: Once | ORAL | Status: AC
  Administered 2015-05-13: 1000 mg via ORAL
  Filled 2015-05-13: qty 2

## 2015-05-13 NOTE — ED Provider Notes (Signed)
CSN: [id number]     Arrival date & time 05/13/15  1252 History   First MD Initiated Contact with Patient 05/13/15 1304     Chief Complaint  Patient presents with  . Aphasia     (Consider location/radiation/quality/duration/timing/severity/associated sxs/prior Treatment) Patient is a 49 y.o. male presenting with Acute Neurological Problem. The history is provided by the patient (The patient states he felt fine when he went to bed last night. When he "woke up this morning he stated that he was having problems getting his words out. He could think of the words but he couldn't say).  Cerebrovascular Accident This is a recurrent problem. The current episode started 6 to 12 hours ago. The problem occurs constantly. The problem has not changed since onset.Pertinent negatives include no chest pain, no abdominal pain and no headaches. Nothing aggravates the symptoms. Nothing relieves the symptoms.    Past Medical History  Diagnosis Date  . Anxiety   . Depression   . Diabetes mellitus   . HTN (hypertension)   . Sleep apnea   . Bipolar 1 disorder ([place])   . Insomnia   . Headache(784.0)   . Schizophrenia ([place])   . Hallucinations     "long history of them"  . Seizures ([place])     last sz between July 5-9th, 2016; epilepsy  . History of gout   . Neuropathy [facility])    Past Surgical History  Procedure Laterality Date  . Mandible fracture surgery    . Knee surgery Left     arthroscopy  . Tonsillectomy    . Colonoscopy with propofol N/A 05/07/2015    Procedure: COLONOSCOPY WITH PROPOFOL at cecum 1055, withdrawal time=20 minutes;  Surgeon: [name], MD;  Location: [facility];  Service: Endoscopy;  Laterality: N/A;  . Esophagogastroduodenoscopy (egd) with propofol N/A 05/07/2015    Procedure: ESOPHAGOGASTRODUODENOSCOPY (EGD) WITH PROPOFOL (Procedure 1);  Surgeon: [name], MD;  Location: [facility];  Service: Endoscopy;  Laterality: N/A;  . Esophageal dilation N/A 05/07/2015    Procedure:  ESOPHAGEAL DILATION WITH 56FR MALONEY DILATOR;  Surgeon: [name], MD;  Location: [facility];  Service: Endoscopy;  Laterality: N/A;  . Esophageal biopsy  05/07/2015    Procedure: BIOPSY (Gastric);  Surgeon: [name], MD;  Location: [facility];  Service: Endoscopy;;  . Polypectomy  05/07/2015    Procedure: POLYPECTOMY (Hepatic Flexure, Distal Transverse Colon, Rectal);  Surgeon: [name], MD;  Location: [facility];  Service: Endoscopy;;   Family History  Problem Relation Age of Onset  . Arthritis    . Diabetes    . Asthma    . Diabetes Father   . Hypertension Father    Social History  Substance Use Topics  . Smoking status: Current Every Day Smoker -- 1.00 packs/day for 30 years    Types: Cigarettes  . Smokeless tobacco: Never Used  . Alcohol Use: Yes     Comment: 02/11/15 "drink a beer every now and then"    Review of Systems  Constitutional: Negative for appetite change and fatigue.  HENT: Negative for congestion, ear discharge and sinus pressure.   Eyes: Negative for discharge.  Respiratory: Negative for cough.   Cardiovascular: Negative for chest pain.  Gastrointestinal: Negative for abdominal pain and diarrhea.  Genitourinary: Negative for frequency and hematuria.  Musculoskeletal: Negative for back pain.  Skin: Negative for rash.  Neurological: Negative for seizures and headaches.       Difficulty getting words out.  Psychiatric/Behavioral:  Negative for hallucinations.      Allergies  Aspirin  Home Medications   Prior to Admission medications   Medication Sig Start Date End Date Taking? Authorizing Provider  atenolol (TENORMIN) 100 MG tablet Take 100 mg by mouth daily.     Yes Historical Provider, MD  divalproex (DEPAKOTE) 500 MG DR tablet Take 2 tablets (1,000 mg total) by mouth 2 (two) times daily. 02/11/15  Yes Penni Bombard, MD  levETIRAcetam (KEPPRA) 500 MG tablet Take 2 tablets (1,000 mg total) by mouth 2 (two) times daily. 02/11/15  Yes Penni Bombard, MD  amoxicillin (AMOXIL) 500 MG capsule Take 1 capsule (500 mg total) by mouth 3 (three) times daily. For 10 days Patient not taking: Reported on 04/29/2015 04/08/15   Tammy Triplett, PA-C  gabapentin (NEURONTIN) 300 MG capsule Take 1 capsule (300 mg total) by mouth at bedtime. 02/16/15   Tanna Furry, MD  insulin aspart (NOVOLOG FLEXPEN) 100 UNIT/ML FlexPen Inject 10 Units into the skin 3 (three) times daily with meals.    Historical Provider, MD  Insulin Glargine (LANTUS SOLOSTAR) 100 UNIT/ML Solostar Pen Inject 10 Units into the skin daily at 10 pm.     Historical Provider, MD  magic mouthwash w/lidocaine SOLN Take 5 mLs by mouth 3 (three) times daily as needed for mouth pain. Swish and spit, do not swallow 04/08/15   Tammy Triplett, PA-C  polyethylene glycol-electrolytes (TRILYTE) 420 G solution Take 4,000 mLs by mouth as directed. 04/16/15   [name], MD  traMADol Veatrice Bourbon) 50 MG tablet 1 or 2 po q6h prn pain Patient not taking: Reported on 04/27/2015 04/20/15   Lily Kocher, PA-C   BP 173/98 mmHg  Pulse 50  Temp(Src) 98.3 F (36.8 C) (Oral)  Resp 16  Ht 5\' 7"  (1.702 m)  Wt 152 lb (68.947 kg)  BMI 23.80 kg/m2  SpO2 97% Physical Exam  Constitutional: He is oriented to person, place, and time. He appears well-developed.  HENT:  Head: Normocephalic.  Eyes: Conjunctivae and EOM are normal. No scleral icterus.  Neck: Neck supple. No thyromegaly present.  Cardiovascular: Normal rate and regular rhythm.  Exam reveals no gallop and no friction rub.   No murmur heard. Pulmonary/Chest: No stridor. He has no wheezes. He has no rales. He exhibits no tenderness.  Abdominal: He exhibits no distension. There is no tenderness. There is no rebound.  Musculoskeletal: Normal range of motion. He exhibits no edema.  Lymphadenopathy:    He has no cervical adenopathy.  Neurological: He is alert and oriented to person, place, and time. He exhibits normal muscle tone. Coordination normal.   Patient is slow to answer but answers questions appropriately has no slurred speech. Patient also states he is having probably get the words out but his words do seem to come out appropriately  Skin: No rash noted. No erythema.  Psychiatric: He has a normal mood and affect. His behavior is normal.    ED Course  Procedures (including critical care time) Labs Review Labs Reviewed  COMPREHENSIVE METABOLIC PANEL - Abnormal; Notable for the following:    Glucose, Bld 250 (*)    ALT 15 (*)    All other components within normal limits  CBG MONITORING, ED - Abnormal; Notable for the following:    Glucose-Capillary 226 (*)    All other components within normal limits  PROTIME-INR  APTT  CBC  DIFFERENTIAL  TROPONIN I  VALPROIC ACID LEVEL  CBG MONITORING, ED    Imaging  Review Ct Head Wo Contrast  05/13/2015  CLINICAL DATA:  Acute onset aphasia this morning. EXAM: CT HEAD WITHOUT CONTRAST TECHNIQUE: Contiguous axial images were obtained from the base of the skull through the vertex without intravenous contrast. COMPARISON:  05/15/2014 FINDINGS: No evidence of intracranial hemorrhage, brain edema, or other signs of acute infarction. No evidence of intracranial mass lesion or mass effect. No abnormal extraaxial fluid collections identified. Ventricles are normal in size. No skull abnormality identified. IMPRESSION: Negative noncontrast head CT. Electronically Signed   By: Earle Gell M.D.   On: 05/13/2015 14:35   I have personally reviewed and evaluated these images and lab results as part of my medical decision-making.   EKG Interpretation None      MDM   Final diagnoses:  None    CT scan of the head normal labs unremarkable. Will get MRI of the head to be certain that the patient didn't have a stroke. Will also check Depakote level. If MRI is negative patient will be referred to be seen by his family doctor.    Milton Ferguson, MD 05/13/15 1556

## 2015-05-13 NOTE — ED Notes (Signed)
Pt reports waking up this morning at 0700 with aphasia.  Pt is able to speak at this time, but is having trouble getting words out, equal strength bilaterally, no droop. Pt alert and oriented.

## 2015-05-13 NOTE — Discharge Instructions (Signed)
Follow up with dr. fanta next week 

## 2015-05-14 ENCOUNTER — Telehealth: Payer: Self-pay | Admitting: Internal Medicine

## 2015-05-14 NOTE — Telephone Encounter (Signed)
Patient had colonoscopy recently by [facility] and has abdominal pain every day and wanted to know what we would advise. If he could have something called in or would he need to come for an OV. Please call [phone]

## 2015-05-15 NOTE — Telephone Encounter (Signed)
Tried to call- NA and no voicemail

## 2015-05-15 NOTE — Telephone Encounter (Signed)
Tried to call pt- NA 

## 2015-05-19 NOTE — Telephone Encounter (Signed)
Unable to reach pt. Please schedule him an earlier appointment.

## 2015-05-20 ENCOUNTER — Encounter: Payer: Self-pay | Admitting: Internal Medicine

## 2015-05-20 NOTE — Telephone Encounter (Signed)
MOVED TO EARLIER APPT AND PATIENT IS AWARE

## 2015-05-28 ENCOUNTER — Encounter (HOSPITAL_COMMUNITY): Payer: Self-pay | Admitting: Internal Medicine

## 2015-06-08 ENCOUNTER — Ambulatory Visit: Payer: Medicaid Other | Admitting: Nurse Practitioner

## 2015-06-08 ENCOUNTER — Ambulatory Visit (INDEPENDENT_AMBULATORY_CARE_PROVIDER_SITE_OTHER): Payer: Medicaid Other | Admitting: Nurse Practitioner

## 2015-06-08 ENCOUNTER — Encounter: Payer: Self-pay | Admitting: Nurse Practitioner

## 2015-06-08 VITALS — BP 144/96 | HR 67 | Temp 98.2°F | Ht 67.0 in | Wt 155.2 lb

## 2015-06-08 DIAGNOSIS — R109 Unspecified abdominal pain: Secondary | ICD-10-CM

## 2015-06-08 DIAGNOSIS — K5900 Constipation, unspecified: Secondary | ICD-10-CM | POA: Diagnosis not present

## 2015-06-08 MED ORDER — OMEPRAZOLE 20 MG PO CPDR: 20.0000 mg | DELAYED_RELEASE_CAPSULE | Freq: Every day | ORAL | Status: DC

## 2015-06-08 NOTE — Assessment & Plan Note (Signed)
The patient has continued abdominal pain, on endoscopy he did have mild chronic gastritis as well as bulbar erosions. He is not currently on a PPI. I'll start him on omeprazole 20 mg once daily to see if this helps his symptoms. He also has some components of possible constipation including required straining for bowel movement although he does have one to 2 bowel movements a day. I'll check an abdominal x-ray to see if there is any retained stool as well as any overt abnormalities. Return for follow-up in 6 weeks. If he continues to be symptomatic despite treatments discussed and this plan can consider possibly further imaging or other evaluation.

## 2015-06-08 NOTE — Progress Notes (Signed)
CC'D TO PCP °

## 2015-06-08 NOTE — Patient Instructions (Signed)
1. I send in a prescription for Prilosec to your pharmacy. Take one 20 mg pill once a day, 30 minutes before your first meal the day. 2. Start taking MiraLAX 17 g (1 capful or one sample packet) once a day. Mix it with any rink of choice including water, juice, coffee, etc. 3. Have your abdominal x-ray done when you're able to.  4. Rturn for follow-up in 6 weeks.

## 2015-06-08 NOTE — Assessment & Plan Note (Signed)
The patient has some elements constipation including required straining and prolonged time for having a bowel movement although he does have a bowel movement a day. He could be having retained stool and I will order an abdominal x-ray to check for this as well as any overt abnormalities. We'll also started on MiraLAX 17 g once a day to promote easier stool passage. Return for follow-up in 6 weeks.

## 2015-06-08 NOTE — Progress Notes (Signed)
Referring Provider: Rosita Fire, MD Primary Care Physician:  Rosita Fire, MD Primary GI: Dr. Gala Romney  Chief Complaint  Patient presents with  . Follow-up  . Abdominal Pain    HPI:   49 year old male returns for follow-up on colonoscopy and endoscopy. Procedures were completed on 05/07/2015. Endoscopy found normal esophagus status post Maloney dilation, abnormal gastric mucosa with biopsy taken, bulbar erosions. Mild chronic gastritis was found on pathology report. Colonoscopy completed the same day found a 5 mm sessile polyp in the rectum, scattered pancolonic diverticula, 1 cm polyp at the hepatic flexure, 2 pedunculated polyps (one 6 mm one 3 mm) in the distal transverse segment, all of which were removed and sent for pathology. Pathology found one polyp to be hyperplastic, one to be tubular adenoma, and one to be sessile pedunculated polyp. At his last office visit there was some complaints of subjective weight loss, although his subjective weight loss reports and objective measurements were in congruent. Recommend repeat colonoscopy in 3 years. Recommend follow-up in office to check weight and status his symptoms.  Today he states he's having pain in his legs. Saw pain clinic and is set for MRI and spinal injection in a month. He is not sure how he'll make it until then. I recommended discussing with the pain clinic. Is also having lower abdominal pain, difficult eating breakfast. Pain is a burning pain. Worse with eating. "When I try eating something I just spit it out and throw it away." He is not currently on a PPI. Denies N/V, hematochezia, melena. Has difficulty urinating which has just started recently. Has a bowel movement about once a day, states he has to strain to have a bowel movement. Denies any other upper or lower GI symptoms.  Of note his weight is improved as he has gained 10 pounds since his last office visit here.  Past Medical History  Diagnosis Date  . Anxiety   .  Depression   . Diabetes mellitus   . HTN (hypertension)   . Sleep apnea   . Bipolar 1 disorder ([place])   . Insomnia   . Headache(784.0)   . Schizophrenia ([place])   . Hallucinations     "long history of them"  . Seizures ([place])     last sz between July 5-9th, 2016; epilepsy  . History of gout   . Neuropathy [facility])     Past Surgical History  Procedure Laterality Date  . Mandible fracture surgery    . Knee surgery Left     arthroscopy  . Tonsillectomy    . Colonoscopy with propofol N/A 05/07/2015    [id number] diverticulosis  . Esophagogastroduodenoscopy (egd) with propofol N/A 05/07/2015    ZES:[id number]  . Esophageal dilation N/A 05/07/2015    Procedure: ESOPHAGEAL DILATION WITH 56FR MALONEY DILATOR;  Surgeon: [name], MD;  Location: [facility];  Service: Endoscopy;  Laterality: N/A;  . Esophageal biopsy  05/07/2015    Procedure: BIOPSY (Gastric);  Surgeon: [name], MD;  Location: [facility];  Service: Endoscopy;;  . Polypectomy  05/07/2015    Procedure: POLYPECTOMY (Hepatic Flexure, Distal Transverse Colon, Rectal);  Surgeon: [name], MD;  Location: [facility];  Service: Endoscopy;;    Current Outpatient Prescriptions  Medication Sig Dispense Refill  . amoxicillin (AMOXIL) 500 MG capsule Take 1 capsule (500 mg total) by mouth 3 (three) times daily. For 10 days (Patient not taking: Reported on 04/29/2015) 30 capsule 0  . atenolol (TENORMIN) 100 MG tablet Take 100  mg by mouth daily.      . divalproex (DEPAKOTE) 500 MG DR tablet Take 2 tablets (1,000 mg total) by mouth 2 (two) times daily. 120 tablet 12  . gabapentin (NEURONTIN) 300 MG capsule Take 1 capsule (300 mg total) by mouth at bedtime. 30 capsule 0  . insulin aspart (NOVOLOG FLEXPEN) 100 UNIT/ML FlexPen Inject 10 Units into the skin 3 (three) times daily with meals.    . Insulin Glargine (LANTUS SOLOSTAR) 100 UNIT/ML Solostar Pen Inject 10 Units into the skin daily at 10 pm.     . levETIRAcetam (KEPPRA) 500 MG tablet Take  2 tablets (1,000 mg total) by mouth 2 (two) times daily. 120 tablet 12  . magic mouthwash w/lidocaine SOLN Take 5 mLs by mouth 3 (three) times daily as needed for mouth pain. Swish and spit, do not swallow 100 mL 0  . polyethylene glycol-electrolytes (TRILYTE) 420 G solution Take 4,000 mLs by mouth as directed. 4000 mL 0  . traMADol (ULTRAM) 50 MG tablet 1 or 2 po q6h prn pain (Patient not taking: Reported on 04/27/2015) 15 tablet 0   No current facility-administered medications for this visit.    Allergies as of 06/08/2015 - Review Complete 06/08/2015  Allergen Reaction Noted  . Aspirin Swelling 12/28/2010    Family History  Problem Relation Age of Onset  . Arthritis    . Diabetes    . Asthma    . Diabetes Father   . Hypertension Father     Social History   Social History  . Marital Status: Single    Spouse Name: N/A  . Number of Children: N/A  . Years of Education: 11th grade   Occupational History  . unemployed    Social History Main Topics  . Smoking status: Current Every Day Smoker -- 1.00 packs/day for 30 years    Types: Cigarettes  . Smokeless tobacco: Never Used  . Alcohol Use: Yes     Comment: 02/11/15 "drink a beer every now and then"  . Drug Use: No     Comment: hx of cocaine use but denies using in over a year, denies marjuana use 01/18/15 and 02/11/15  . Sexual Activity: Not Currently   Other Topics Concern  . None   Social History Narrative   Lives alone   Drinks a cup of coffee a day    Review of Systems: General: Negative for anorexia, weight loss, fever, chills, fatigue, weakness. ENT: Negative for hoarseness, difficulty swallowing. CV: Negative for chest pain, angina, palpitations, peripheral edema.  Respiratory: Negative for dyspnea at rest, cough, sputum, wheezing.  GI: See history of present illness. Endo: Negative for unusual weight change.  Heme: Negative for bruising or bleeding.   Physical Exam: BP 144/96 mmHg  Pulse 67  Temp(Src)  98.2 F (36.8 C)  Ht 5\' 7"  (1.702 m)  Wt 155 lb 3.2 oz (70.398 kg)  BMI 24.30 kg/m2 General:   Alert and oriented. Pleasant and cooperative. Well-nourished and well-developed.  Head:  Normocephalic and atraumatic. Eyes:  Without icterus, sclera clear and conjunctiva pink.  Ears:  Normal auditory acuity. Cardiovascular:  S1, S2 present without murmurs appreciated. Normal pulses noted. Extremities without clubbing or edema. Respiratory:  Clear to auscultation bilaterally. No wheezes, rales, or rhonchi. No distress.  Gastrointestinal:  +BS, soft, non-tender and non-distended. No HSM noted. No guarding or rebound. No masses appreciated.  Rectal:  Deferred  Neurologic:  Alert and oriented x4;  grossly normal neurologically. Psych:  Alert and cooperative.  Normal mood and affect. Heme/Lymph/Immune: No excessive bruising noted.    06/08/2015 10:47 AM

## 2015-06-11 ENCOUNTER — Ambulatory Visit (HOSPITAL_COMMUNITY)
Admission: RE | Admit: 2015-06-11 | Discharge: 2015-06-11 | Disposition: A | Discharge status: Home / Self Care | Payer: Medicaid Other | Source: Ambulatory Visit | Attending: Nurse Practitioner | Admitting: Nurse Practitioner

## 2015-06-11 DIAGNOSIS — K5900 Constipation, unspecified: Secondary | ICD-10-CM | POA: Insufficient documentation

## 2015-06-11 DIAGNOSIS — R1030 Lower abdominal pain, unspecified: Secondary | ICD-10-CM | POA: Insufficient documentation

## 2015-06-11 DIAGNOSIS — R109 Unspecified abdominal pain: Secondary | ICD-10-CM

## 2015-07-03 ENCOUNTER — Ambulatory Visit: Payer: Medicaid Other | Admitting: "Endocrinology

## 2015-07-03 LAB — HEMOGLOBIN A1C: HEMOGLOBIN A1C: 9.8

## 2015-07-16 ENCOUNTER — Ambulatory Visit: Payer: Medicaid Other | Admitting: Nurse Practitioner

## 2015-07-21 ENCOUNTER — Telehealth: Payer: Self-pay | Admitting: Nurse Practitioner

## 2015-07-21 ENCOUNTER — Encounter: Payer: Self-pay | Admitting: Nurse Practitioner

## 2015-07-21 ENCOUNTER — Ambulatory Visit: Payer: Medicaid Other | Admitting: Nurse Practitioner

## 2015-07-21 NOTE — Telephone Encounter (Signed)
PATIENT WAS A NO SHOW AND LETTER SENT  °

## 2015-07-21 NOTE — Telephone Encounter (Signed)
Noted  

## 2015-07-23 ENCOUNTER — Encounter: Payer: Self-pay | Admitting: "Endocrinology

## 2015-07-23 ENCOUNTER — Ambulatory Visit (INDEPENDENT_AMBULATORY_CARE_PROVIDER_SITE_OTHER): Payer: Medicaid Other | Admitting: "Endocrinology

## 2015-07-23 VITALS — BP 137/93 | HR 79 | Ht 68.0 in | Wt 145.0 lb

## 2015-07-23 DIAGNOSIS — I10 Essential (primary) hypertension: Secondary | ICD-10-CM | POA: Insufficient documentation

## 2015-07-23 DIAGNOSIS — Z9119 Patient's noncompliance with other medical treatment and regimen: Secondary | ICD-10-CM

## 2015-07-23 DIAGNOSIS — E1165 Type 2 diabetes mellitus with hyperglycemia: Secondary | ICD-10-CM

## 2015-07-23 DIAGNOSIS — E118 Type 2 diabetes mellitus with unspecified complications: Secondary | ICD-10-CM | POA: Diagnosis not present

## 2015-07-23 DIAGNOSIS — IMO0002 Reserved for concepts with insufficient information to code with codable children: Secondary | ICD-10-CM

## 2015-07-23 DIAGNOSIS — Z91199 Patient's noncompliance with other medical treatment and regimen due to unspecified reason: Secondary | ICD-10-CM | POA: Insufficient documentation

## 2015-07-23 DIAGNOSIS — Z794 Long term (current) use of insulin: Secondary | ICD-10-CM | POA: Diagnosis not present

## 2015-07-23 HISTORY — DX: Essential (primary) hypertension: I10

## 2015-07-23 HISTORY — DX: Patient's noncompliance with other medical treatment and regimen due to unspecified reason: Z91.199

## 2015-07-23 MED ORDER — METFORMIN HCL 500 MG PO TABS: 500.0000 mg | ORAL_TABLET | Freq: Two times a day (BID) | ORAL | Status: DC

## 2015-07-23 MED ORDER — INSULIN GLARGINE 100 UNIT/ML SOLOSTAR PEN: 18.0000 [IU] | PEN_INJECTOR | Freq: Every day | SUBCUTANEOUS | Status: DC

## 2015-07-23 NOTE — Progress Notes (Signed)
Subjective:    Patient ID: Melvin Taylor, male    DOB: 24-Jan-1966, PCP Rosita Fire, MD   Past Medical History  Diagnosis Date  . Anxiety   . Depression   . Diabetes mellitus   . HTN (hypertension)   . Sleep apnea   . Bipolar 1 disorder ([place])   . Insomnia   . Headache(784.0)   . Schizophrenia ([place])   . Hallucinations     "long history of them"  . Seizures ([place])     last sz between July 5-9th, 2016; epilepsy  . History of gout   . Neuropathy [facility])    Past Surgical History  Procedure Laterality Date  . Mandible fracture surgery    . Knee surgery Left     arthroscopy  . Tonsillectomy    . Colonoscopy with propofol N/A 05/07/2015    [id number] diverticulosis  . Esophagogastroduodenoscopy (egd) with propofol N/A 05/07/2015    [id number]  . Esophageal dilation N/A 05/07/2015    Procedure: ESOPHAGEAL DILATION WITH 56FR MALONEY DILATOR;  Surgeon: [name], MD;  Location: [facility];  Service: Endoscopy;  Laterality: N/A;  . Esophageal biopsy  05/07/2015    Procedure: BIOPSY (Gastric);  Surgeon: [name], MD;  Location: [facility];  Service: Endoscopy;;  . Polypectomy  05/07/2015    Procedure: POLYPECTOMY (Hepatic Flexure, Distal Transverse Colon, Rectal);  Surgeon: [name], MD;  Location: [facility];  Service: Endoscopy;;   Social History   Social History  . Marital Status: Single    Spouse Name: N/A  . Number of Children: N/A  . Years of Education: 11th grade   Occupational History  . unemployed    Social History Main Topics  . Smoking status: Current Every Day Smoker -- 1.00 packs/day for 30 years    Types: Cigarettes  . Smokeless tobacco: Never Used  . Alcohol Use: Yes     Comment: 02/11/15 "drink a beer every now and then"  . Drug Use: No     Comment: hx of cocaine use but denies using in over a year, denies marjuana use 01/18/15 and 02/11/15  . Sexual Activity: Not Currently   Other Topics Concern  . None   Social History Narrative   Lives  alone   Drinks a cup of coffee a day   Outpatient Encounter Prescriptions as of 07/23/2015  Medication Sig  . atenolol (TENORMIN) 100 MG tablet Take 100 mg by mouth daily.    . divalproex (DEPAKOTE) 500 MG DR tablet Take 2 tablets (1,000 mg total) by mouth 2 (two) times daily.  Marland Kitchen gabapentin (NEURONTIN) 300 MG capsule Take 1 capsule (300 mg total) by mouth at bedtime.  . Insulin Glargine (LANTUS SOLOSTAR) 100 UNIT/ML Solostar Pen Inject 18 Units into the skin at bedtime.  . levETIRAcetam (KEPPRA) 500 MG tablet Take 2 tablets (1,000 mg total) by mouth 2 (two) times daily.  . [DISCONTINUED] insulin aspart (NOVOLOG FLEXPEN) 100 UNIT/ML FlexPen Inject 4 Units into the skin 3 (three) times daily with meals.   . [DISCONTINUED] Insulin Glargine (LANTUS SOLOSTAR) 100 UNIT/ML Solostar Pen Inject 18 Units into the skin at bedtime.   . metFORMIN (GLUCOPHAGE) 500 MG tablet Take 1 tablet (500 mg total) by mouth 2 (two) times daily with a meal.  . omeprazole (PRILOSEC) 20 MG capsule Take 1 capsule (20 mg total) by mouth daily.   No facility-administered encounter medications on file as of 07/23/2015.   ALLERGIES: Allergies  Allergen Reactions  . Aspirin  Swelling   VACCINATION STATUS: Immunization History  Administered Date(s) Administered  . Tdap 01/18/2015    Diabetes He presents for his follow-up diabetic visit. He has type 2 diabetes mellitus. Onset time: He was diagnosed the approximate age of 30 years. His disease course has been improving. There are no hypoglycemic associated symptoms. Pertinent negatives for hypoglycemia include no confusion, headaches, pallor or seizures. Associated symptoms include fatigue, polydipsia and polyuria. Pertinent negatives for diabetes include no chest pain, no polyphagia and no weakness. There are no hypoglycemic complications. Symptoms are improving. (He is seriously noncompliant patient.) Risk factors for coronary artery disease include diabetes mellitus,  dyslipidemia, hypertension, male sex, tobacco exposure and sedentary lifestyle. Current diabetic treatment includes insulin injections. He is compliant with treatment none of the time. He is following a generally unhealthy diet. When asked about meal planning, he reported none. He has had a previous visit with a dietitian. Home blood sugar record trend: He did not bring her meter nor logs. He admits he is not monitoring regularly.  Hyperlipidemia This is a chronic problem. The current episode started more than 1 year ago. Exacerbating diseases include diabetes. Pertinent negatives include no chest pain, myalgias or shortness of breath. Risk factors for coronary artery disease include diabetes mellitus, dyslipidemia, hypertension, a sedentary lifestyle and male sex.  Hypertension Pertinent negatives include no chest pain, headaches, neck pain, palpitations or shortness of breath.     Review of Systems  Constitutional: Positive for fatigue. Negative for unexpected weight change.  HENT: Negative for dental problem, mouth sores and trouble swallowing.   Eyes: Negative for visual disturbance.  Respiratory: Negative for cough, choking, chest tightness, shortness of breath and wheezing.   Cardiovascular: Negative for chest pain, palpitations and leg swelling.  Gastrointestinal: Negative for nausea, vomiting, abdominal pain, diarrhea, constipation and abdominal distention.  Endocrine: Positive for polydipsia and polyuria. Negative for polyphagia.  Genitourinary: Negative for dysuria, urgency, hematuria and flank pain.  Musculoskeletal: Negative for myalgias, back pain, gait problem and neck pain.  Skin: Negative for pallor, rash and wound.  Neurological: Negative for seizures, syncope, weakness, numbness and headaches.  Psychiatric/Behavioral: Negative.  Negative for confusion and dysphoric mood.    Objective:    BP 137/93 mmHg  Pulse 79  Ht 5\' 8"  (1.727 m)  Wt 145 lb (65.772 kg)  BMI 22.05  kg/m2  SpO2 98%  Wt Readings from Last 3 Encounters:  07/23/15 145 lb (65.772 kg)  06/08/15 155 lb 3.2 oz (70.398 kg)  05/13/15 152 lb (68.947 kg)    Physical Exam  Constitutional: He is oriented to person, place, and time. He appears well-developed and well-nourished. He is cooperative. No distress.  HENT:  Head: Normocephalic and atraumatic.  Eyes: EOM are normal.  Neck: Normal range of motion. Neck supple. No tracheal deviation present. No thyromegaly present.  Cardiovascular: Normal rate, S1 normal, S2 normal and normal heart sounds.  Exam reveals no gallop.   No murmur heard. Pulses:      Dorsalis pedis pulses are 1+ on the right side, and 1+ on the left side.       Posterior tibial pulses are 1+ on the right side, and 1+ on the left side.  Pulmonary/Chest: Breath sounds normal. No respiratory distress. He has no wheezes.  Abdominal: Soft. Bowel sounds are normal. He exhibits no distension. There is no tenderness. There is no guarding and no CVA tenderness.  Musculoskeletal: He exhibits no edema.       Right shoulder: He exhibits  no swelling and no deformity.  Neurological: He is alert and oriented to person, place, and time. He has normal strength and normal reflexes. No cranial nerve deficit or sensory deficit. Gait normal.  Skin: Skin is warm and dry. No rash noted. No cyanosis. Nails show no clubbing.  Psychiatric: His speech is normal. Cognition and memory are normal.  Noncompliant behavior.   Chemistry (most recent): Lab Results  Component Value Date   NA 137 05/13/2015   K 3.6 05/13/2015   CL 101 05/13/2015   CO2 28 05/13/2015   BUN 14 05/13/2015   CREATININE 0.86 05/13/2015   Diabetic Labs (most recent): Lab Results  Component Value Date   HGBA1C 9.8 07/03/2015   HGBA1C 15.2* 06/30/2014   HGBA1C 12.0* 09/07/2013   Lipid Panel     Component Value Date/Time   CHOL  10/26/2010 0458    162        ATP III CLASSIFICATION:  <200     mg/dL   Desirable  200-239   mg/dL   Borderline High  >=240    mg/dL   High          TRIG 172* 10/26/2010 0458   HDL 47 10/26/2010 0458   CHOLHDL 3.4 10/26/2010 0458   VLDL 34 10/26/2010 0458   LDLCALC  10/26/2010 0458    81        Total Cholesterol/HDL:CHD Risk Coronary Heart Disease Risk Table                     Men   Women  1/2 Average Risk   3.4   3.3  Average Risk       5.0   4.4  2 X Average Risk   9.6   7.1  3 X Average Risk  23.4   11.0        Use the calculated Patient Ratio above and the CHD Risk Table to determine the patient's CHD Risk.        ATP III CLASSIFICATION (LDL):  <100     mg/dL   Optimal  100-129  mg/dL   Near or Above                    Optimal  130-159  mg/dL   Borderline  160-189  mg/dL   High  >190     mg/dL   Very High    Assessment & Plan:   1. Uncontrolled type 2 diabetes mellitus with complication, with long-term current use of insulin ([place])  - diabetes is  complicated by noncompliance and patient remains at a high risk for more acute and chronic complications of diabetes which include CAD, CVA, CKD, retinopathy, and neuropathy. These are all discussed in detail with the patient.  Patient came with out his meter nor glucose profile, and  recent A1c of 9.8 %.  Glucose logs and insulin administration records pertaining to this visit,  to be scanned into patient's records.  Recent labs reviewed.   - I have re-counseled the patient on diet management   by adopting a carbohydrate restricted / protein rich  Diet.  - Suggestion is made for patient to avoid simple carbohydrates   from their diet including Cakes , Desserts, Ice Cream,  Soda (  diet and regular) , Sweet Tea , Candies,  Chips, Cookies, Artificial Sweeteners,   and "Sugar-free" Products .  This will help patient to have stable blood glucose profile and potentially avoid unintended  Weight  gain.  - Patient is advised to stick to a routine mealtimes to eat 3 meals  a day and avoid unnecessary snacks ( to snack only to  correct hypoglycemia).  - The patient  has been  scheduled with Jearld Fenton, RDN, CDE for individualized DM education.  - I have approached patient with the following individualized plan to manage diabetes and patient reluctantly accepts . -Since he does not have adequate engagement for safe use, I will discontinue NovoLog. I urged him to continue Lantus 18 units daily at bedtime associated with monitoring of BG AC  breakfast .  -I will start metformin 500 mg by mouth twice a day.  - He is not a good candidate for incretin therapy . - Patient specific target  for A1c; LDL, HDL, Triglycerides, and  Waist Circumference were discussed in detail.  2) BP/HTN: Uncontrolled. Continue current medications. 3)  Weight/Diet: CDE consult in progress, exercise, and carbohydrates information provided. 4) medical noncompliance: I counseled him to refocus for better care of diabetes. 5) Chronic Care/Health Maintenance:  -Patient  Is on Statin medications and encouraged to continue to follow up with Ophthalmology, Podiatrist at least yearly or according to recommendations, and advised to quit smoking. I have recommended yearly flu vaccine and pneumonia vaccination at least every 5 years; moderate intensity exercise for up to 150 minutes weekly; and  sleep for at least 7 hours a day.  - 25 minutes of time was spent on the care of this patient , 50% of which was applied for counseling on diabetes complications and their preventions.  - I advised patient to maintain close follow up with [facility], MD for primary care needs.  Patient is asked to bring meter and  blood glucose logs during their next visit.   Follow up plan: -Return in about 3 months (around 10/21/2015) for diabetes, high blood pressure, follow up with pre-visit labs, meter, and logs.  Glade Lloyd, MD Phone: [phone]  Fax: [phone]   07/24/2015, 11:11 AM

## 2015-07-29 NOTE — Progress Notes (Unsigned)
B/B: 134/90 taken by Cleaster Corin, RN

## 2015-08-02 DIAGNOSIS — I219 Acute myocardial infarction, unspecified: Secondary | ICD-10-CM

## 2015-08-02 HISTORY — DX: Acute myocardial infarction, unspecified: I21.9

## 2015-08-12 ENCOUNTER — Ambulatory Visit: Payer: Medicaid Other | Admitting: Nurse Practitioner

## 2015-08-13 ENCOUNTER — Encounter: Payer: Self-pay | Admitting: General Practice

## 2015-08-18 ENCOUNTER — Ambulatory Visit: Payer: Medicaid Other | Admitting: Nurse Practitioner

## 2015-08-19 ENCOUNTER — Ambulatory Visit: Payer: Medicaid Other | Admitting: Diagnostic Neuroimaging

## 2015-08-25 ENCOUNTER — Encounter: Payer: Self-pay | Admitting: Gastroenterology

## 2015-08-25 ENCOUNTER — Ambulatory Visit (INDEPENDENT_AMBULATORY_CARE_PROVIDER_SITE_OTHER): Payer: Medicaid Other | Admitting: Gastroenterology

## 2015-08-25 VITALS — BP 127/84 | HR 80 | Temp 97.6°F | Ht 67.0 in | Wt 155.0 lb

## 2015-08-25 DIAGNOSIS — K5900 Constipation, unspecified: Secondary | ICD-10-CM | POA: Diagnosis not present

## 2015-08-25 DIAGNOSIS — R634 Abnormal weight loss: Secondary | ICD-10-CM | POA: Insufficient documentation

## 2015-08-25 DIAGNOSIS — R1084 Generalized abdominal pain: Secondary | ICD-10-CM

## 2015-08-25 NOTE — Progress Notes (Signed)
Primary Care Physician: Rosita Fire, MD  Primary Gastroenterologist:  Garfield Cornea, MD   Chief Complaint  Patient presents with  . Abdominal Pain  . Constipation    HPI: Melvin Taylor is a 50 y.o. male here for follow-up. Last seen in November 2016. EGD and colonoscopy back in October with normal esophagus status post [facility] dilation, mild chronic gastritis and bulbar erosions. Colonoscopy showed pancolonic diverticulosis, multiple colon polyps removed one was tubular adenoma, one was sessile pedunculated polyp. Recommend repeat colonoscopy in 3 years.  Abdominal x-ray after last office visit was unremarkable except for moderate stool.  Doing a lot better. miralax prn. Bowel function regular. No melena, brbpr. No abdominal pain. No heartburn. Weight up 10 pounds. Appetite normal. Only issues is ongoing back pain managed by pain clinic. Awaiting MRI results currently.    Wt Readings from Last 3 Encounters:  08/25/15 155 lb (70.308 kg)  07/23/15 145 lb (65.772 kg)  06/08/15 155 lb 3.2 oz (70.398 kg)     Current Outpatient Prescriptions  Medication Sig Dispense Refill  . atenolol (TENORMIN) 100 MG tablet Take 100 mg by mouth daily.      . divalproex (DEPAKOTE) 500 MG DR tablet Take 2 tablets (1,000 mg total) by mouth 2 (two) times daily. 120 tablet 12  . gabapentin (NEURONTIN) 300 MG capsule Take 1 capsule (300 mg total) by mouth at bedtime. 30 capsule 0  . Insulin Glargine (LANTUS SOLOSTAR) 100 UNIT/ML Solostar Pen Inject 18 Units into the skin at bedtime. 15 mL 2  . levETIRAcetam (KEPPRA) 500 MG tablet Take 2 tablets (1,000 mg total) by mouth 2 (two) times daily. 120 tablet 12  . metFORMIN (GLUCOPHAGE) 500 MG tablet Take 1 tablet (500 mg total) by mouth 2 (two) times daily with a meal. 60 tablet 3  . omeprazole (PRILOSEC) 20 MG capsule Take 1 capsule (20 mg total) by mouth daily. 90 capsule 3   No current facility-administered medications for this visit.     Allergies as of 08/25/2015 - Review Complete 08/25/2015  Allergen Reaction Noted  . Aspirin Swelling 12/28/2010    ROS:  General: Negative for anorexia, weight loss, fever, chills, fatigue, weakness. ENT: Negative for hoarseness, difficulty swallowing , nasal congestion. CV: Negative for chest pain, angina, palpitations, dyspnea on exertion, peripheral edema.  Respiratory: Negative for dyspnea at rest, dyspnea on exertion, cough, sputum, wheezing.  GI: See history of present illness. GU:  Negative for dysuria, hematuria, urinary incontinence, urinary frequency, nocturnal urination.  Endo: Negative for unusual weight change.    Physical Examination:   BP 127/84 mmHg  Pulse 80  Temp(Src) 97.6 F (36.4 C) (Oral)  Ht 5\' 7"  (1.702 m)  Wt 155 lb (70.308 kg)  BMI 24.27 kg/m2  General: Well-nourished, well-developed in no acute distress.  Eyes: No icterus. Mouth: Oropharyngeal mucosa moist and pink , no lesions erythema or exudate. Lungs: Clear to auscultation bilaterally.  Heart: Regular rate and rhythm, no murmurs rubs or gallops.  Abdomen: Bowel sounds are normal, nontender, nondistended, no hepatosplenomegaly or masses, no abdominal bruits or hernia , no rebound or guarding.   Extremities: No lower extremity edema. No clubbing or deformities. Neuro: Alert and oriented x 4   Skin: Warm and dry, no jaundice.   Psych: Alert and cooperative, normal mood and affect.  Lab Results  Component Value Date   WBC 6.3 05/13/2015   HGB 16.4 05/13/2015   HCT 45.6 05/13/2015   MCV 89.8 05/13/2015   PLT  166 05/13/2015   Lab Results  Component Value Date   CREATININE 0.86 05/13/2015   BUN 14 05/13/2015   NA 137 05/13/2015   K 3.6 05/13/2015   CL 101 05/13/2015   CO2 28 05/13/2015   Lab Results  Component Value Date   ALT 15* 05/13/2015   AST 16 05/13/2015   ALKPHOS 45 05/13/2015   BILITOT 1.0 05/13/2015     Imaging Studies: No results found.

## 2015-08-25 NOTE — Assessment & Plan Note (Signed)
50 year old gentleman who presents for follow-up of abdominal pain, weight loss. Doing very well this time. Symptoms 100% resolved. Has gained 10 pounds since last OV. Currently on PPI as well as MiraLAX when necessary. Bowel function normal. Appetite improved. Continue current regimen. Office visit as needed. Otherwise he will be due for surveillance colonoscopy in 3 years as planned.

## 2015-08-25 NOTE — Patient Instructions (Signed)
1. Continue MiraLAX as needed for constipation. 2. Office visit as needed especially for recurrent abdominal pain, constipation, weight loss.

## 2015-08-26 NOTE — Progress Notes (Signed)
CC'ED TO PCP 

## 2015-08-31 ENCOUNTER — Encounter (HOSPITAL_COMMUNITY): Payer: Self-pay | Admitting: Emergency Medicine

## 2015-08-31 ENCOUNTER — Emergency Department (HOSPITAL_COMMUNITY)
Admission: EM | Admit: 2015-08-31 | Discharge: 2015-08-31 | Disposition: A | Discharge status: Home / Self Care | Payer: Medicaid Other | Attending: Emergency Medicine | Admitting: Emergency Medicine

## 2015-08-31 DIAGNOSIS — G4700 Insomnia, unspecified: Secondary | ICD-10-CM | POA: Diagnosis not present

## 2015-08-31 DIAGNOSIS — G629 Polyneuropathy, unspecified: Secondary | ICD-10-CM | POA: Diagnosis not present

## 2015-08-31 DIAGNOSIS — M545 Low back pain: Secondary | ICD-10-CM | POA: Insufficient documentation

## 2015-08-31 DIAGNOSIS — Z7984 Long term (current) use of oral hypoglycemic drugs: Secondary | ICD-10-CM | POA: Diagnosis not present

## 2015-08-31 DIAGNOSIS — Z79899 Other long term (current) drug therapy: Secondary | ICD-10-CM | POA: Insufficient documentation

## 2015-08-31 DIAGNOSIS — Z794 Long term (current) use of insulin: Secondary | ICD-10-CM | POA: Insufficient documentation

## 2015-08-31 DIAGNOSIS — F419 Anxiety disorder, unspecified: Secondary | ICD-10-CM | POA: Diagnosis not present

## 2015-08-31 DIAGNOSIS — F319 Bipolar disorder, unspecified: Secondary | ICD-10-CM | POA: Diagnosis not present

## 2015-08-31 DIAGNOSIS — I10 Essential (primary) hypertension: Secondary | ICD-10-CM | POA: Insufficient documentation

## 2015-08-31 DIAGNOSIS — M549 Dorsalgia, unspecified: Secondary | ICD-10-CM

## 2015-08-31 DIAGNOSIS — F17210 Nicotine dependence, cigarettes, uncomplicated: Secondary | ICD-10-CM | POA: Insufficient documentation

## 2015-08-31 DIAGNOSIS — E119 Type 2 diabetes mellitus without complications: Secondary | ICD-10-CM | POA: Diagnosis not present

## 2015-08-31 MED ORDER — NAPROXEN 500 MG PO TABS: 500.0000 mg | ORAL_TABLET | Freq: Two times a day (BID) | ORAL | Status: DC

## 2015-08-31 MED ORDER — METHOCARBAMOL 500 MG PO TABS: 500.0000 mg | ORAL_TABLET | Freq: Two times a day (BID) | ORAL | Status: DC

## 2015-08-31 NOTE — Discharge Instructions (Signed)
Take the prescribed medication as directed. Follow-up with your pain doctor-- if you do not hear from them by tomorrow afternoon please call their office again to confirm appt. Otherwise, you may follow up with your primary care physician. Return to the ED for new or worsening symptoms.
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
## 2015-08-31 NOTE — ED Notes (Signed)
Patient with no complaints at this time. Respirations even and unlabored. Skin warm/dry. Discharge instructions reviewed with patient at this time. Patient given opportunity to voice concerns/ask questions. Patient discharged at this time and left Emergency Department with steady gait.   

## 2015-08-31 NOTE — ED Notes (Signed)
Pt reports lower back pain bilaterally x6 months, denies urinary complaints.  Pt received steroid shot 3 weeks ago.

## 2015-09-04 ENCOUNTER — Ambulatory Visit: Payer: Medicaid Other | Admitting: Diagnostic Neuroimaging

## 2015-09-07 ENCOUNTER — Encounter: Payer: Self-pay | Admitting: Diagnostic Neuroimaging

## 2015-09-17 ENCOUNTER — Encounter (HOSPITAL_COMMUNITY): Payer: Self-pay | Admitting: Emergency Medicine

## 2015-09-17 ENCOUNTER — Emergency Department (HOSPITAL_COMMUNITY)
Admission: EM | Admit: 2015-09-17 | Discharge: 2015-09-17 | Disposition: A | Discharge status: Home / Self Care | Payer: Medicaid Other | Attending: Emergency Medicine | Admitting: Emergency Medicine

## 2015-09-17 DIAGNOSIS — Z7984 Long term (current) use of oral hypoglycemic drugs: Secondary | ICD-10-CM | POA: Insufficient documentation

## 2015-09-17 DIAGNOSIS — M791 Myalgia: Secondary | ICD-10-CM | POA: Insufficient documentation

## 2015-09-17 DIAGNOSIS — I10 Essential (primary) hypertension: Secondary | ICD-10-CM | POA: Insufficient documentation

## 2015-09-17 DIAGNOSIS — M109 Gout, unspecified: Secondary | ICD-10-CM | POA: Diagnosis not present

## 2015-09-17 DIAGNOSIS — Z8669 Personal history of other diseases of the nervous system and sense organs: Secondary | ICD-10-CM | POA: Insufficient documentation

## 2015-09-17 DIAGNOSIS — F419 Anxiety disorder, unspecified: Secondary | ICD-10-CM | POA: Diagnosis not present

## 2015-09-17 DIAGNOSIS — F17210 Nicotine dependence, cigarettes, uncomplicated: Secondary | ICD-10-CM | POA: Diagnosis not present

## 2015-09-17 DIAGNOSIS — F319 Bipolar disorder, unspecified: Secondary | ICD-10-CM | POA: Diagnosis not present

## 2015-09-17 DIAGNOSIS — E119 Type 2 diabetes mellitus without complications: Secondary | ICD-10-CM | POA: Diagnosis not present

## 2015-09-17 DIAGNOSIS — M545 Low back pain, unspecified: Secondary | ICD-10-CM

## 2015-09-17 DIAGNOSIS — Z79899 Other long term (current) drug therapy: Secondary | ICD-10-CM | POA: Insufficient documentation

## 2015-09-17 DIAGNOSIS — Z791 Long term (current) use of non-steroidal anti-inflammatories (NSAID): Secondary | ICD-10-CM | POA: Insufficient documentation

## 2015-09-17 DIAGNOSIS — Z794 Long term (current) use of insulin: Secondary | ICD-10-CM | POA: Insufficient documentation

## 2015-09-17 MED ORDER — DIAZEPAM 10 MG PO TABS: 10.0000 mg | ORAL_TABLET | Freq: Four times a day (QID) | ORAL | Status: DC | PRN

## 2015-09-17 MED ORDER — PREDNISONE 10 MG PO TABS: 10.0000 mg | ORAL_TABLET | Freq: Once | ORAL | Status: DC
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
## 2015-09-17 NOTE — ED Notes (Signed)
Fall to floor this am.  C/o back pain and pain to left arm.  Rates pain 10/10 for both.

## 2015-09-17 NOTE — Discharge Instructions (Signed)
Use heat on the sore area 3 or 4 times a day. Follow-up with your primary care doctor for checkup, as needed.   Back Pain, Adult Back pain is very common in adults.The cause of back pain is rarely dangerous and the pain often gets better over time.The cause of your back pain may not be known. Some common causes of back pain include:  Strain of the muscles or ligaments supporting the spine.  Wear and tear (degeneration) of the spinal disks.  Arthritis.  Direct injury to the back. For many people, back pain may return. Since back pain is rarely dangerous, most people can learn to manage this condition on their own. HOME CARE INSTRUCTIONS Watch your back pain for any changes. The following actions may help to lessen any discomfort you are feeling:  Remain active. It is stressful on your back to sit or stand in one place for long periods of time. Do not sit, drive, or stand in one place for more than 30 minutes at a time. Take short walks on even surfaces as soon as you are able.Try to increase the length of time you walk each day.  Exercise regularly as directed by your health care provider. Exercise helps your back heal faster. It also helps avoid future injury by keeping your muscles strong and flexible.  Do not stay in bed.Resting more than 1-2 days can delay your recovery.  Pay attention to your body when you bend and lift. The most comfortable positions are those that put less stress on your recovering back. Always use proper lifting techniques, including:  Bending your knees.  Keeping the load close to your body.  Avoiding twisting.  Find a comfortable position to sleep. Use a firm mattress and lie on your side with your knees slightly bent. If you lie on your back, put a pillow under your knees.  Avoid feeling anxious or stressed.Stress increases muscle tension and can worsen back pain.It is important to recognize when you are anxious or stressed and learn ways to manage  it, such as with exercise.  Take medicines only as directed by your health care provider. Over-the-counter medicines to reduce pain and inflammation are often the most helpful.Your health care provider may prescribe muscle relaxant drugs.These medicines help dull your pain so you can more quickly return to your normal activities and healthy exercise.  Apply ice to the injured area:  Put ice in a plastic bag.  Place a towel between your skin and the bag.  Leave the ice on for 20 minutes, 2-3 times a day for the first 2-3 days. After that, ice and heat may be alternated to reduce pain and spasms.  Maintain a healthy weight. Excess weight puts extra stress on your back and makes it difficult to maintain good posture. SEEK MEDICAL CARE IF:  You have pain that is not relieved with rest or medicine.  You have increasing pain going down into the legs or buttocks.  You have pain that does not improve in one week.  You have night pain.  You lose weight.  You have a fever or chills. SEEK IMMEDIATE MEDICAL CARE IF:   You develop new bowel or bladder control problems.  You have unusual weakness or numbness in your arms or legs.  You develop nausea or vomiting.  You develop abdominal pain.  You feel faint.   This information is not intended to replace advice given to you by your health care provider. Make sure you discuss any questions  you have with your health care provider.   Document Released: 07/18/2005 Document Revised: 08/08/2014 Document Reviewed: 11/19/2013 Elsevier Interactive Patient Education [employer].

## 2015-09-21 ENCOUNTER — Encounter: Payer: Self-pay | Admitting: Diagnostic Neuroimaging

## 2015-09-21 ENCOUNTER — Ambulatory Visit (INDEPENDENT_AMBULATORY_CARE_PROVIDER_SITE_OTHER): Payer: Medicaid Other | Admitting: Diagnostic Neuroimaging

## 2015-09-21 VITALS — BP 152/102 | HR 55 | Wt 153.7 lb

## 2015-09-21 DIAGNOSIS — M5442 Lumbago with sciatica, left side: Secondary | ICD-10-CM

## 2015-09-21 DIAGNOSIS — F3170 Bipolar disorder, currently in remission, most recent episode unspecified: Secondary | ICD-10-CM

## 2015-09-21 DIAGNOSIS — G4700 Insomnia, unspecified: Secondary | ICD-10-CM | POA: Diagnosis not present

## 2015-09-21 DIAGNOSIS — M5441 Lumbago with sciatica, right side: Secondary | ICD-10-CM | POA: Diagnosis not present

## 2015-09-21 DIAGNOSIS — G40219 Localization-related (focal) (partial) symptomatic epilepsy and epileptic syndromes with complex partial seizures, intractable, without status epilepticus: Secondary | ICD-10-CM

## 2015-09-21 MED ORDER — LEVETIRACETAM 500 MG PO TABS: 1000.0000 mg | ORAL_TABLET | Freq: Two times a day (BID) | ORAL | Status: DC

## 2015-09-21 MED ORDER — DIVALPROEX SODIUM 500 MG PO DR TAB: 1500.0000 mg | DELAYED_RELEASE_TABLET | Freq: Two times a day (BID) | ORAL | Status: DC

## 2015-09-21 NOTE — Patient Instructions (Signed)
Thank you for coming to see [place] at [facility]. I hope we have been able to provide you high quality care today.  You may receive a patient satisfaction survey over the next few weeks. We would appreciate your feedback and comments so that we may continue to improve ourselves and the health of our patients.  - increase divalproex to 1516m twice a day - continue levetiracetam 10073mtwice a day   ~~~~~~~~~~~~~~~~~~~~~~~~~~~~~~~~~~~~~~~~~~~~~~~~~~~~~~~~~~~~~~~~~  DR. Vondra Aldredge'S GUIDE TO HAPPY AND HEALTHY LIVING These are some of my general health and wellness recommendations. Some of them may apply to you better than others. Please use common sense as you try these suggestions and feel free to ask me any questions.   ACTIVITY/FITNESS Mental, social, emotional and physical stimulation are very important for brain and body health. Try learning a new activity (arts, music, language, sports, games).  Keep moving your body to the best of your abilities. You can do this at home, inside or outside, the park, community center, gym or anywhere you like. Consider a physical therapist or personal trainer to get started. Consider the app Sworkit. Fitness trackers such as smart-watches, smart-phones or Fitbits can help as well.   NUTRITION Eat more plants: colorful vegetables, nuts, seeds and berries.  Eat less sugar, salt, preservatives and processed foods.  Avoid toxins such as cigarettes and alcohol.  Drink water when you are thirsty. Warm water with a slice of lemon is an excellent morning drink to start the day.  Consider these websites for more information The Nutrition Source (hthttps://www.henry-hernandez.biz/[employer] Nutrition (wwWindowBlog.ch  RELAXATION Consider practicing mindfulness meditation or other relaxation techniques such as deep breathing, prayer, yoga, tai chi, massage. See website mindful.org or the apps  Headspace or Calm to help get started.   SLEEP Try to get at least 7-8+ hours sleep per day. Regular exercise and reduced caffeine will help you sleep better. Practice good sleep hygeine techniques. See website sleep.org for more information.   PLANNING Prepare estate planning, living will, healthcare POA documents. Sometimes this is best planned with the help of an attorney. Theconversationproject.org and agingwithdignity.org are excellent resources.
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
## 2015-09-24 ENCOUNTER — Encounter (HOSPITAL_COMMUNITY): Payer: Self-pay | Admitting: Emergency Medicine

## 2015-09-24 ENCOUNTER — Emergency Department (HOSPITAL_COMMUNITY)
Admission: EM | Admit: 2015-09-24 | Discharge: 2015-09-24 | Disposition: A | Discharge status: Home / Self Care | Payer: Medicaid Other | Attending: Emergency Medicine | Admitting: Emergency Medicine

## 2015-09-24 DIAGNOSIS — F419 Anxiety disorder, unspecified: Secondary | ICD-10-CM | POA: Diagnosis not present

## 2015-09-24 DIAGNOSIS — I10 Essential (primary) hypertension: Secondary | ICD-10-CM | POA: Diagnosis not present

## 2015-09-24 DIAGNOSIS — Z7984 Long term (current) use of oral hypoglycemic drugs: Secondary | ICD-10-CM | POA: Insufficient documentation

## 2015-09-24 DIAGNOSIS — Z794 Long term (current) use of insulin: Secondary | ICD-10-CM | POA: Insufficient documentation

## 2015-09-24 DIAGNOSIS — R569 Unspecified convulsions: Secondary | ICD-10-CM | POA: Diagnosis not present

## 2015-09-24 DIAGNOSIS — M545 Low back pain: Secondary | ICD-10-CM | POA: Diagnosis present

## 2015-09-24 DIAGNOSIS — F319 Bipolar disorder, unspecified: Secondary | ICD-10-CM | POA: Diagnosis not present

## 2015-09-24 DIAGNOSIS — G629 Polyneuropathy, unspecified: Secondary | ICD-10-CM | POA: Diagnosis not present

## 2015-09-24 DIAGNOSIS — F17210 Nicotine dependence, cigarettes, uncomplicated: Secondary | ICD-10-CM | POA: Diagnosis not present

## 2015-09-24 DIAGNOSIS — G8929 Other chronic pain: Secondary | ICD-10-CM | POA: Diagnosis not present

## 2015-09-24 DIAGNOSIS — Z87828 Personal history of other (healed) physical injury and trauma: Secondary | ICD-10-CM | POA: Diagnosis not present

## 2015-09-24 DIAGNOSIS — Z79899 Other long term (current) drug therapy: Secondary | ICD-10-CM | POA: Diagnosis not present

## 2015-09-24 DIAGNOSIS — E119 Type 2 diabetes mellitus without complications: Secondary | ICD-10-CM | POA: Insufficient documentation

## 2015-09-24 DIAGNOSIS — M549 Dorsalgia, unspecified: Secondary | ICD-10-CM

## 2015-09-24 MED ORDER — DEXAMETHASONE SODIUM PHOSPHATE 4 MG/ML IJ SOLN
8.0000 mg | Freq: Once | INTRAMUSCULAR | Status: AC
  Administered 2015-09-24: 8 mg via INTRAMUSCULAR
  Filled 2015-09-24: qty 2

## 2015-09-24 MED ORDER — DIAZEPAM 5 MG PO TABS
10.0000 mg | ORAL_TABLET | Freq: Once | ORAL | Status: AC
  Administered 2015-09-24: 10 mg via ORAL
  Filled 2015-09-24: qty 2

## 2015-09-24 MED ORDER — DIAZEPAM 5 MG PO TABS: 5.0000 mg | ORAL_TABLET | Freq: Four times a day (QID) | ORAL | Status: DC | PRN

## 2015-09-24 NOTE — Discharge Instructions (Signed)
Please use Tylenol extra strength, and Valium 5 mg every 6 hours for assistance with your back pain. Please show these medications to Dr. Legrand Rams during your visit today, so that they may become a part of your medical record. Please discuss with Dr. Legrand Rams, and your pain management specialist a planned for your breakthrough pain, as the emergency department is not set up for pain management. Chronic Back Pain  When back pain lasts longer than 3 months, it is called chronic back pain.People with chronic back pain often go through certain periods that are more intense (flare-ups).  CAUSES Chronic back pain can be caused by wear and tear (degeneration) on different structures in your back. These structures include:  The bones of your spine (vertebrae) and the joints surrounding your spinal cord and nerve roots (facets).  The strong, fibrous tissues that connect your vertebrae (ligaments). Degeneration of these structures may result in pressure on your nerves. This can lead to constant pain. HOME CARE INSTRUCTIONS  Avoid bending, heavy lifting, prolonged sitting, and activities which make the problem worse.  Take brief periods of rest throughout the day to reduce your pain. Lying down or standing usually is better than sitting while you are resting.  Take over-the-counter or prescription medicines only as directed by your caregiver. SEEK IMMEDIATE MEDICAL CARE IF:   You have weakness or numbness in one of your legs or feet.  You have trouble controlling your bladder or bowels.  You have nausea, vomiting, abdominal pain, shortness of breath, or fainting.   This information is not intended to replace advice given to you by your health care provider. Make sure you discuss any questions you have with your health care provider.   Document Released: 08/25/2004 Document Revised: 10/10/2011 Document Reviewed: 01/05/2015 Elsevier Interactive Patient Education [employer].
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
## 2015-09-28 DIAGNOSIS — Z0279 Encounter for issue of other medical certificate: Secondary | ICD-10-CM

## 2015-10-16 ENCOUNTER — Encounter (HOSPITAL_COMMUNITY): Payer: Self-pay | Admitting: Emergency Medicine

## 2015-10-16 ENCOUNTER — Emergency Department (HOSPITAL_COMMUNITY): Payer: Medicaid Other

## 2015-10-16 ENCOUNTER — Emergency Department (HOSPITAL_COMMUNITY)
Admission: EM | Admit: 2015-10-16 | Discharge: 2015-10-16 | Disposition: A | Discharge status: Home / Self Care | Payer: Medicaid Other | Attending: Emergency Medicine | Admitting: Emergency Medicine

## 2015-10-16 DIAGNOSIS — F329 Major depressive disorder, single episode, unspecified: Secondary | ICD-10-CM | POA: Insufficient documentation

## 2015-10-16 DIAGNOSIS — I10 Essential (primary) hypertension: Secondary | ICD-10-CM | POA: Insufficient documentation

## 2015-10-16 DIAGNOSIS — Z794 Long term (current) use of insulin: Secondary | ICD-10-CM | POA: Diagnosis not present

## 2015-10-16 DIAGNOSIS — Z79899 Other long term (current) drug therapy: Secondary | ICD-10-CM | POA: Insufficient documentation

## 2015-10-16 DIAGNOSIS — M79602 Pain in left arm: Secondary | ICD-10-CM | POA: Diagnosis not present

## 2015-10-16 DIAGNOSIS — F17210 Nicotine dependence, cigarettes, uncomplicated: Secondary | ICD-10-CM | POA: Diagnosis not present

## 2015-10-16 DIAGNOSIS — M549 Dorsalgia, unspecified: Secondary | ICD-10-CM | POA: Insufficient documentation

## 2015-10-16 DIAGNOSIS — E119 Type 2 diabetes mellitus without complications: Secondary | ICD-10-CM | POA: Diagnosis not present

## 2015-10-16 DIAGNOSIS — Z7984 Long term (current) use of oral hypoglycemic drugs: Secondary | ICD-10-CM | POA: Diagnosis not present

## 2015-10-16 DIAGNOSIS — M79605 Pain in left leg: Secondary | ICD-10-CM | POA: Diagnosis not present

## 2015-10-16 DIAGNOSIS — R200 Anesthesia of skin: Secondary | ICD-10-CM | POA: Diagnosis present

## 2015-10-16 DIAGNOSIS — M5489 Other dorsalgia: Secondary | ICD-10-CM

## 2015-10-16 HISTORY — DX: Pain, unspecified: R52

## 2015-10-16 HISTORY — DX: Other chronic pain: G89.29

## 2015-10-16 HISTORY — DX: Other chronic pain: M54.9

## 2015-10-16 LAB — COMPREHENSIVE METABOLIC PANEL
ALK PHOS: 37 U/L — AB (ref 38–126)
ALT: 14 U/L — AB (ref 17–63)
ANION GAP: 6 (ref 5–15)
AST: 17 U/L (ref 15–41)
Albumin: 4.3 g/dL (ref 3.5–5.0)
BILIRUBIN TOTAL: 0.7 mg/dL (ref 0.3–1.2)
BUN: 13 mg/dL (ref 6–20)
CALCIUM: 8.5 mg/dL — AB (ref 8.9–10.3)
CO2: 29 mmol/L (ref 22–32)
CREATININE: 0.85 mg/dL (ref 0.61–1.24)
Chloride: 103 mmol/L (ref 101–111)
Glucose, Bld: 104 mg/dL — ABNORMAL HIGH (ref 65–99)
Potassium: 3.4 mmol/L — ABNORMAL LOW (ref 3.5–5.1)
SODIUM: 138 mmol/L (ref 135–145)
TOTAL PROTEIN: 6.8 g/dL (ref 6.5–8.1)

## 2015-10-16 LAB — CBC WITH DIFFERENTIAL/PLATELET
Basophils Absolute: 0 10*3/uL (ref 0.0–0.1)
Basophils Relative: 1 %
EOS ABS: 0.2 10*3/uL (ref 0.0–0.7)
Eosinophils Relative: 3 %
HCT: 43.3 % (ref 39.0–52.0)
HEMOGLOBIN: 15.4 g/dL (ref 13.0–17.0)
LYMPHS ABS: 3.2 10*3/uL (ref 0.7–4.0)
LYMPHS PCT: 53 %
MCH: 31.8 pg (ref 26.0–34.0)
MCHC: 35.6 g/dL (ref 30.0–36.0)
MCV: 89.3 fL (ref 78.0–100.0)
MONOS PCT: 8 %
Monocytes Absolute: 0.5 10*3/uL (ref 0.1–1.0)
NEUTROS PCT: 35 %
Neutro Abs: 2.1 10*3/uL (ref 1.7–7.7)
Platelets: 195 10*3/uL (ref 150–400)
RBC: 4.85 MIL/uL (ref 4.22–5.81)
RDW: 12.5 % (ref 11.5–15.5)
WBC: 5.9 10*3/uL (ref 4.0–10.5)

## 2015-10-16 NOTE — ED Notes (Signed)
Pt now states numbness is not a new problem and that it has been occuring intermittently for months.  Has been evaluated before, with no answers.  Pt reports pain is his main problem in his left arm and back and is chronic in nature.  Sees Dr. Lyla Son in [place] for pain management, but is not prescribed pain medications.  States he is supposed to go for a "nerve procedure" in a few weeks, but has changed his mind and is not going to allow them to do it.

## 2015-10-16 NOTE — ED Notes (Signed)
Pt upset he is not receiving treatment for his back and arm pain.  Advised pt we are evaluating possible causes and his pain management doctor would need to further address this.

## 2015-10-16 NOTE — ED Notes (Addendum)
Pt reports waking up this morning at 0500 with left sided numbness in arm and leg, pt also has back pain.  Pt moving all limbs appropriately but does have weakness in left side. Pt alert and oriented.

## 2015-10-16 NOTE — ED Notes (Signed)
Entered room to reassess pt, pt c/o shooting pains in left leg.  Advised pt that MRI is ordered to evaluate his numbness and tingling.  Pt reports he does not have numbness and tingling, but is here for his pain.  States he has this all of the time and knows he does not need an MRI.  Advised pt he initially told us he was having numbness, pt rescinds this and states it is just pain.  MD made aware and entered room to talk with patient.

## 2015-10-16 NOTE — ED Provider Notes (Signed)
CSN: [id number]     Arrival date & time 10/16/15  1214 History   First MD Initiated Contact with Patient 10/16/15 1304     Chief Complaint  Patient presents with  . Numbness     (Consider location/radiation/quality/duration/timing/severity/associated sxs/prior Treatment) HPI 50 year old male who presents with left arm numbness and weakness. He has a history of chronic low back pain, bipolar disorder, seizure disorder, schizophrenia, hypertension, hyperlipidemia, and diabetes. States that he has had long-standing low back pain associated with left leg numbness and weakness, which has been managed by Dr. Francesco Runner as an outpatient with steroid injections. Last had an injection about 3 weeks ago and he states that his back pain has been much worse since then. Since then has also noticed numbness and weakness of the left arm. This morning at 5 AM felt that his symptoms were worse, so came to the ED for evaluation. Denies any facial numbness or weakness, speech changes, vision changes, fevers or chills, night sweats, urinary retention, saddle anesthesia, bowel incontinence, or recent falls. States he has an appointment with Dr. Francesco Runner on Tuesday of next week, but could not wait for evaluation due to these ongoing symptoms.  Past Medical History  Diagnosis Date  . Anxiety   . Depression   . Diabetes mellitus   . HTN (hypertension)   . Sleep apnea   . Bipolar 1 disorder ([place])   . Insomnia   . Headache(784.0)   . Schizophrenia ([place])   . Hallucinations     "long history of them"  . Seizures ([place])     last sz between July 5-9th, 2016; epilepsy  . History of gout   . Neuropathy ([place])   . Chronic back pain   . Pain management    Past Surgical History  Procedure Laterality Date  . Mandible fracture surgery    . Knee surgery Left     arthroscopy  . Tonsillectomy    . Colonoscopy with propofol N/A 05/07/2015    [id number] diverticulosis, multiple colon polyps removed, tubular adenoma,  serrated colon polyp. Next colonoscopy October 2019  . Esophagogastroduodenoscopy (egd) with propofol N/A 05/07/2015    RMR: Status post dilation of normal esophagus. Gastritis.  . Esophageal dilation N/A 05/07/2015    Procedure: ESOPHAGEAL DILATION WITH 56FR MALONEY DILATOR;  Surgeon: [name], MD;  Location: [facility];  Service: Endoscopy;  Laterality: N/A;  . Biopsy  05/07/2015    Procedure: BIOPSY (Gastric);  Surgeon: [name], MD;  Location: [facility];  Service: Endoscopy;;  . Polypectomy  05/07/2015    Procedure: POLYPECTOMY (Hepatic Flexure, Distal Transverse Colon, Rectal);  Surgeon: [name], MD;  Location: [facility];  Service: Endoscopy;;   Family History  Problem Relation Age of Onset  . Arthritis    . Diabetes    . Asthma    . Diabetes Father   . Hypertension Father   . Stroke Sister    Social History  Substance Use Topics  . Smoking status: Current Every Day Smoker -- 1.00 packs/day for 30 years    Types: Cigarettes  . Smokeless tobacco: Never Used     Comment: 09/21/15 trying to quit, < 1 PPD  . Alcohol Use: Yes     Comment: 02/11/15 "drink a beer every now and then"    Review of Systems 10/14 systems reviewed and are negative other than those stated in the HPI    Allergies  Aspirin  Home Medications   Prior to Admission medications  Medication Sig Start Date End Date Taking? Authorizing Provider  atenolol (TENORMIN) 100 MG tablet Take 100 mg by mouth daily.     Yes Historical Provider, MD  diazepam (VALIUM) 5 MG tablet Take 1 tablet (5 mg total) by mouth every 6 (six) hours as needed for anxiety. 09/24/15  Yes Lily Kocher, PA-C  divalproex (DEPAKOTE) 500 MG DR tablet Take 3 tablets (1,500 mg total) by mouth 2 (two) times daily. 09/21/15  Yes Penni Bombard, MD  Insulin Glargine (LANTUS SOLOSTAR) 100 UNIT/ML Solostar Pen Inject 18 Units into the skin at bedtime. Patient taking differently: Inject 10 Units into the skin at bedtime.  07/23/15  Yes  Cassandria Anger, MD  levETIRAcetam (KEPPRA) 500 MG tablet Take 2 tablets (1,000 mg total) by mouth 2 (two) times daily. 09/21/15  Yes Penni Bombard, MD  metFORMIN (GLUCOPHAGE) 500 MG tablet Take 1 tablet (500 mg total) by mouth 2 (two) times daily with a meal. Patient not taking: Reported on 10/16/2015 07/23/15   Cassandria Anger, MD  omeprazole (PRILOSEC) 20 MG capsule Take 1 capsule (20 mg total) by mouth daily. Patient not taking: Reported on 10/16/2015 06/08/15   Carlis Stable, NP   BP 164/99 mmHg  Pulse 51  Temp(Src) 98.1 F (36.7 C) (Oral)  Resp 16  Ht 5\' 7"  (1.702 m)  Wt 165 lb (74.844 kg)  BMI 25.84 kg/m2  SpO2 100% Physical Exam Physical Exam  Nursing note and vitals reviewed. Constitutional: Well developed, well nourished, non-toxic, and in no acute distress Head: Normocephalic and atraumatic.  Mouth/Throat: Oropharynx is clear and moist.  Neck: Normal range of motion. Neck supple. No cervical spine tenderness.  Cardiovascular: Normal rate and regular rhythm.   Pulmonary/Chest: Effort normal and breath sounds normal.  Abdominal: Soft. There is no tenderness. There is no rebound and no guarding.  Musculoskeletal: No deformities. Skin: Skin is warm and dry.  Psychiatric: Cooperative Neurological:  Alert, oriented to person, place, time, and situation. Memory grossly in tact. Fluent speech. No dysarthria or aphasia.  Cranial nerves: VF are full. Fundoscopic exam-unable to get good visualization of the discs. Pupils are symmetric, and reactive to light. EOMI without nystagmus. No gaze deviation. Facial muscles symmetric with activation. Sensation to light touch over face in tact bilaterally. Hearing grossly in tact. Palate elevates symmetrically. Head turn and shoulder shrug are intact. Tongue midline.  Reflexes defered.  Muscle bulk and tone normal. No pronator drift. Exam limited by poor effort. Some weakness in left hip/knee extension/flexion and ankle  dorsi/plantarflexion (baseline per patient) Sensation to light touch is in tact throughout in face and right upper and lower extremities. Reported diminished sensation in the left upper and lower extremities (lower baseline, upper new) Coordination reveals no dysmetria with finger to nose bilaterally. Gait is narrow-based and steady with cane (baseline)   ED Course  Procedures (including critical care time) Labs Review Labs Reviewed  COMPREHENSIVE METABOLIC PANEL - Abnormal; Notable for the following:    Potassium 3.4 (*)    Glucose, Bld 104 (*)    Calcium 8.5 (*)    ALT 14 (*)    Alkaline Phosphatase 37 (*)    All other components within normal limits  CBC WITH DIFFERENTIAL/PLATELET    Imaging Review Ct Head Wo Contrast  10/16/2015  CLINICAL DATA:  LEFT side numbness in arm and leg upon awakening this morning at 0500 hours, back pain, LEFT side weakness, hypertension, type II diabetes mellitus, smoker EXAM: CT HEAD WITHOUT CONTRAST  TECHNIQUE: Contiguous axial images were obtained from the base of the skull through the vertex without intravenous contrast. COMPARISON:  05/13/2015 FINDINGS: Normal ventricular morphology. No midline shift or mass effect. Normal appearance of brain parenchyma. No intracranial hemorrhage, mass lesion or evidence acute infarction. No extra-axial fluid collections. Sinuses clear and bones unremarkable. IMPRESSION: Normal exam. Electronically Signed   By: Lavonia Dana M.D.   On: 10/16/2015 14:23   I have personally reviewed and evaluated these images and lab results as part of my medical decision-making.   EKG Interpretation   Date/Time:  Friday October 16 2015 12:22:50 EDT Ventricular Rate:  53 PR Interval:  179 QRS Duration: 81 QT Interval:  428 QTC Calculation: 402 R Axis:   -45 Text Interpretation:  Sinus rhythm LAD, consider left anterior fascicular  block No significant change since last tracing Confirmed by LIU MD, Hinton Dyer  [id number]) on 10/16/2015 2:32:54  PM      MDM   Final diagnoses:  Midline back pain, unspecified location  Left leg pain  Left arm pain   49 year old male with history of diabetes, hypertension, chronic low back pain, hyperlipidemia who presents with back pain, left lower extremity pain, and left arm numbness and weakness. He is well-appearing in no acute distress. His neuro exam is very limited by lack of effort. For example, he has no pronator drift or dysmetria, but barely squeezes my hand with hand grip, and has a lot of giveaway weakness with individual muscle testing. With reported diminished sensation in the left upper extremity. States that he has baseline numbness and weakness in the left lower extremity, but the upper extremity symptoms are new. A CT head was performed and is negative, and his blood work overall is unremarkable. Given the setting of his risk factors for stroke I discussed MRI with him. Less concerned about his baseline neuro deficits in his lower extremity, and back pain which seems unchanged. No new symptoms they'll be suspicious for emergent process in the spinal cord or spine. On further discussion, he states that he does not need MRI, and that now he truly does not have any numbness or weakness in his arm, and it was more pain and he was here for pain control. I discussed with him that he has a pain physician as well as back surgeon and other physicians who managed his pain chronically. He subsequently states that he was ready for discharge and refused further testing. He will have continued follow-up with his outpatient physicians. He has an appointment with his spine physician on Tuesday next week. Strict return and follow-up instructions reviewed. He expressed understanding of all discharge instructions and felt comfortable with the plan of care    Forde Dandy, MD 10/16/15 [phone]

## 2015-10-16 NOTE — Discharge Instructions (Signed)
You need to follow up with your pain doctor and your back doctor regarding any further management regarding your pain. Return for worsening symptoms, including inability to walk, loss of control of urine or bowel, new numbness/weakness or any other symptoms concerning to you.  Chronic Back Pain  When back pain lasts longer than 3 months, it is called chronic back pain.People with chronic back pain often go through certain periods that are more intense (flare-ups).  CAUSES Chronic back pain can be caused by wear and tear (degeneration) on different structures in your back. These structures include:  The bones of your spine (vertebrae) and the joints surrounding your spinal cord and nerve roots (facets).  The strong, fibrous tissues that connect your vertebrae (ligaments). Degeneration of these structures may result in pressure on your nerves. This can lead to constant pain. HOME CARE INSTRUCTIONS  Avoid bending, heavy lifting, prolonged sitting, and activities which make the problem worse.  Take brief periods of rest throughout the day to reduce your pain. Lying down or standing usually is better than sitting while you are resting.  Take over-the-counter or prescription medicines only as directed by your caregiver. SEEK IMMEDIATE MEDICAL CARE IF:   You have weakness or numbness in one of your legs or feet.  You have trouble controlling your bladder or bowels.  You have nausea, vomiting, abdominal pain, shortness of breath, or fainting.   This information is not intended to replace advice given to you by your health care provider. Make sure you discuss any questions you have with your health care provider.   Document Released: 08/25/2004 Document Revised: 10/10/2011 Document Reviewed: 01/05/2015 Elsevier Interactive Patient Education [employer].

## 2015-10-19 ENCOUNTER — Other Ambulatory Visit: Payer: Self-pay | Admitting: "Endocrinology

## 2015-10-19 LAB — BASIC METABOLIC PANEL
BUN: 17 mg/dL (ref 7–25)
CHLORIDE: 107 mmol/L (ref 98–110)
CO2: 30 mmol/L (ref 20–31)
CREATININE: 0.96 mg/dL (ref 0.70–1.33)
Calcium: 8.5 mg/dL — ABNORMAL LOW (ref 8.6–10.3)
Glucose, Bld: 134 mg/dL — ABNORMAL HIGH (ref 65–99)
Potassium: 4.2 mmol/L (ref 3.5–5.3)
Sodium: 143 mmol/L (ref 135–146)

## 2015-10-19 LAB — HEMOGLOBIN A1C
Hgb A1c MFr Bld: 8.3 % — ABNORMAL HIGH (ref <5.7)
Mean Plasma Glucose: 192 mg/dL — ABNORMAL HIGH (ref <117)

## 2015-10-19 LAB — TSH: TSH: 0.58 m[IU]/L (ref 0.40–4.50)

## 2015-10-19 LAB — T4, FREE: FREE T4: 1.1 ng/dL (ref 0.8–1.8)

## 2015-10-26 ENCOUNTER — Ambulatory Visit (INDEPENDENT_AMBULATORY_CARE_PROVIDER_SITE_OTHER): Payer: Medicaid Other | Admitting: "Endocrinology

## 2015-10-26 ENCOUNTER — Encounter: Payer: Self-pay | Admitting: "Endocrinology

## 2015-10-26 VITALS — BP 150/100 | HR 76 | Resp 18 | Wt 152.0 lb

## 2015-10-26 DIAGNOSIS — I10 Essential (primary) hypertension: Secondary | ICD-10-CM | POA: Diagnosis not present

## 2015-10-26 DIAGNOSIS — Z794 Long term (current) use of insulin: Secondary | ICD-10-CM | POA: Diagnosis not present

## 2015-10-26 DIAGNOSIS — Z9119 Patient's noncompliance with other medical treatment and regimen: Secondary | ICD-10-CM

## 2015-10-26 DIAGNOSIS — E1165 Type 2 diabetes mellitus with hyperglycemia: Secondary | ICD-10-CM | POA: Diagnosis not present

## 2015-10-26 DIAGNOSIS — IMO0002 Reserved for concepts with insufficient information to code with codable children: Secondary | ICD-10-CM

## 2015-10-26 DIAGNOSIS — E118 Type 2 diabetes mellitus with unspecified complications: Secondary | ICD-10-CM

## 2015-10-26 DIAGNOSIS — Z91199 Patient's noncompliance with other medical treatment and regimen due to unspecified reason: Secondary | ICD-10-CM

## 2015-10-26 MED ORDER — GLUCOSE BLOOD VI STRP: ORAL_STRIP | Status: DC

## 2015-10-26 MED ORDER — INSULIN GLARGINE 100 UNIT/ML SOLOSTAR PEN: 18.0000 [IU] | PEN_INJECTOR | Freq: Every day | SUBCUTANEOUS | Status: DC

## 2015-10-26 NOTE — Progress Notes (Signed)
Subjective:    Patient ID: Melvin Taylor, male    DOB: 09-05-1965, PCP Rosita Fire, MD   Past Medical History  Diagnosis Date  . Anxiety   . Depression   . Diabetes mellitus   . HTN (hypertension)   . Sleep apnea   . Bipolar 1 disorder ([place])   . Insomnia   . Headache(784.0)   . Schizophrenia ([place])   . Hallucinations     "long history of them"  . Seizures ([place])     last sz between July 5-9th, 2016; epilepsy  . History of gout   . Neuropathy ([place])   . Chronic back pain   . Pain management    Past Surgical History  Procedure Laterality Date  . Mandible fracture surgery    . Knee surgery Left     arthroscopy  . Tonsillectomy    . Colonoscopy with propofol N/A 05/07/2015    [id number] diverticulosis, multiple colon polyps removed, tubular adenoma, serrated colon polyp. Next colonoscopy October 2019  . Esophagogastroduodenoscopy (egd) with propofol N/A 05/07/2015    RMR: Status post dilation of normal esophagus. Gastritis.  . Esophageal dilation N/A 05/07/2015    Procedure: ESOPHAGEAL DILATION WITH 56FR MALONEY DILATOR;  Surgeon: [name], MD;  Location: [facility];  Service: Endoscopy;  Laterality: N/A;  . Biopsy  05/07/2015    Procedure: BIOPSY (Gastric);  Surgeon: [name], MD;  Location: [facility];  Service: Endoscopy;;  . Polypectomy  05/07/2015    Procedure: POLYPECTOMY (Hepatic Flexure, Distal Transverse Colon, Rectal);  Surgeon: [name], MD;  Location: [facility];  Service: Endoscopy;;   Social History   Social History  . Marital Status: Single    Spouse Name: N/A  . Number of Children: N/A  . Years of Education: 11th grade   Occupational History  . unemployed    Social History Main Topics  . Smoking status: Current Every Day Smoker -- 1.00 packs/day for 30 years    Types: Cigarettes  . Smokeless tobacco: Never Used     Comment: 09/21/15 trying to quit, < 1 PPD  . Alcohol Use: Yes     Comment: 02/11/15 "drink a beer every now and then"  .  Drug Use: No     Comment: hx of cocaine use but denies using in over a year, denies marjuana use 01/18/15 and 02/11/15  . Sexual Activity: Not Currently   Other Topics Concern  . None   Social History Narrative   Lives alone   Drinks a cup of coffee a day   Outpatient Encounter Prescriptions as of 10/26/2015  Medication Sig  . atenolol (TENORMIN) 100 MG tablet Take 100 mg by mouth daily.    . diazepam (VALIUM) 5 MG tablet Take 1 tablet (5 mg total) by mouth every 6 (six) hours as needed for anxiety.  . divalproex (DEPAKOTE) 500 MG DR tablet Take 3 tablets (1,500 mg total) by mouth 2 (two) times daily.  . Insulin Glargine (LANTUS SOLOSTAR) 100 UNIT/ML Solostar Pen Inject 18 Units into the skin at bedtime.  . levETIRAcetam (KEPPRA) 500 MG tablet Take 2 tablets (1,000 mg total) by mouth 2 (two) times daily.  . metFORMIN (GLUCOPHAGE) 500 MG tablet Take 1 tablet (500 mg total) by mouth 2 (two) times daily with a meal.  . omeprazole (PRILOSEC) 20 MG capsule Take 1 capsule (20 mg total) by mouth daily.  . [DISCONTINUED] Insulin Glargine (LANTUS SOLOSTAR) 100 UNIT/ML Solostar Pen Inject 18 Units into the  skin at bedtime. (Patient taking differently: Inject 10 Units into the skin at bedtime. )  . glucose blood (ACCU-CHEK AVIVA) test strip Use to test 2 times a day   No facility-administered encounter medications on file as of 10/26/2015.   ALLERGIES: Allergies  Allergen Reactions  . Aspirin Swelling   VACCINATION STATUS: Immunization History  Administered Date(s) Administered  . Influenza,trivalent, recombinat, inj, PF 05/02/2015  . Tdap 01/18/2015    Diabetes He presents for his follow-up diabetic visit. He has type 2 diabetes mellitus. Onset time: He was diagnosed the approximate age of 56 years. His disease course has been improving. There are no hypoglycemic associated symptoms. Pertinent negatives for hypoglycemia include no confusion, headaches, pallor or seizures. Associated symptoms  include fatigue, polydipsia and polyuria. Pertinent negatives for diabetes include no chest pain, no polyphagia and no weakness. There are no hypoglycemic complications. Symptoms are improving. (He is seriously noncompliant patient.) Risk factors for coronary artery disease include diabetes mellitus, dyslipidemia, hypertension, male sex, tobacco exposure and sedentary lifestyle. Current diabetic treatment includes insulin injections. He is compliant with treatment none of the time. His weight is increasing steadily. He is following a generally unhealthy diet. When asked about meal planning, he reported none. He has had a previous visit with a dietitian. Home blood sugar record trend: He claims to have lost his test strips for his Accu-Chek Aviva meter and did not check blood glucose for the last 14 days.  Hyperlipidemia This is a chronic problem. The current episode started more than 1 year ago. Exacerbating diseases include diabetes. Pertinent negatives include no chest pain, myalgias or shortness of breath. Risk factors for coronary artery disease include diabetes mellitus, dyslipidemia, hypertension, a sedentary lifestyle and male sex.  Hypertension Pertinent negatives include no chest pain, headaches, neck pain, palpitations or shortness of breath.     Review of Systems  Constitutional: Positive for fatigue. Negative for unexpected weight change.  HENT: Negative for dental problem, mouth sores and trouble swallowing.   Eyes: Negative for visual disturbance.  Respiratory: Negative for cough, choking, chest tightness, shortness of breath and wheezing.   Cardiovascular: Negative for chest pain, palpitations and leg swelling.  Gastrointestinal: Negative for nausea, vomiting, abdominal pain, diarrhea, constipation and abdominal distention.  Endocrine: Positive for polydipsia and polyuria. Negative for polyphagia.  Genitourinary: Negative for dysuria, urgency, hematuria and flank pain.   Musculoskeletal: Negative for myalgias, back pain, gait problem and neck pain.  Skin: Negative for pallor, rash and wound.  Neurological: Negative for seizures, syncope, weakness, numbness and headaches.  Psychiatric/Behavioral: Negative.  Negative for confusion and dysphoric mood.    Objective:    BP 150/100 mmHg  Pulse 76  Resp 18  Wt 152 lb (68.947 kg)  SpO2 98%  Wt Readings from Last 3 Encounters:  10/26/15 152 lb (68.947 kg)  10/16/15 165 lb (74.844 kg)  09/24/15 155 lb (70.308 kg)    Physical Exam  Constitutional: He is oriented to person, place, and time. He appears well-developed and well-nourished. He is cooperative. No distress.  HENT:  Head: Normocephalic and atraumatic.  Eyes: EOM are normal.  Neck: Normal range of motion. Neck supple. No tracheal deviation present. No thyromegaly present.  Cardiovascular: Normal rate, S1 normal, S2 normal and normal heart sounds.  Exam reveals no gallop.   No murmur heard. Pulses:      Dorsalis pedis pulses are 1+ on the right side, and 1+ on the left side.       Posterior tibial pulses  are 1+ on the right side, and 1+ on the left side.  Pulmonary/Chest: Breath sounds normal. No respiratory distress. He has no wheezes.  Abdominal: Soft. Bowel sounds are normal. He exhibits no distension. There is no tenderness. There is no guarding and no CVA tenderness.  Musculoskeletal: He exhibits no edema.       Right shoulder: He exhibits no swelling and no deformity.  Neurological: He is alert and oriented to person, place, and time. He has normal strength and normal reflexes. No cranial nerve deficit or sensory deficit. Gait normal.  Skin: Skin is warm and dry. No rash noted. No cyanosis. Nails show no clubbing.  Psychiatric: His speech is normal. Cognition and memory are normal.  Noncompliant behavior.   Chemistry (most recent): Lab Results  Component Value Date   NA 143 10/19/2015   K 4.2 10/19/2015   CL 107 10/19/2015   CO2 30  10/19/2015   BUN 17 10/19/2015   CREATININE 0.96 10/19/2015   Diabetic Labs (most recent): Lab Results  Component Value Date   HGBA1C 8.3* 10/19/2015   HGBA1C 9.8 07/03/2015   HGBA1C 15.2* 06/30/2014   Lipid Panel     Component Value Date/Time   CHOL  10/26/2010 0458    162        ATP III CLASSIFICATION:  <200     mg/dL   Desirable  200-239  mg/dL   Borderline High  >=240    mg/dL   High          TRIG 172* 10/26/2010 0458   HDL 47 10/26/2010 0458   CHOLHDL 3.4 10/26/2010 0458   VLDL 34 10/26/2010 0458   LDLCALC  10/26/2010 0458    81        Total Cholesterol/HDL:CHD Risk Coronary Heart Disease Risk Table                     Men   Women  1/2 Average Risk   3.4   3.3  Average Risk       5.0   4.4  2 X Average Risk   9.6   7.1  3 X Average Risk  23.4   11.0        Use the calculated Patient Ratio above and the CHD Risk Table to determine the patient's CHD Risk.        ATP III CLASSIFICATION (LDL):  <100     mg/dL   Optimal  100-129  mg/dL   Near or Above                    Optimal  130-159  mg/dL   Borderline  160-189  mg/dL   High  >190     mg/dL   Very High    Assessment & Plan:   1. Uncontrolled type 2 diabetes mellitus with complication, with long-term current use of insulin ([place])  - diabetes is  complicated by noncompliance and patient remains at a high risk for more acute and chronic complications of diabetes which include CAD, CVA, CKD, retinopathy, and neuropathy. These are all discussed in detail with the patient.  Patient came with out his meter nor glucose profile, and  recent A1c 8.3% improving from 15.2 %.  Glucose logs and insulin administration records pertaining to this visit,  to be scanned into patient's records.  Recent labs reviewed.   - I have re-counseled the patient on diet management   by adopting a carbohydrate restricted / protein  rich  Diet.  - Suggestion is made for patient to avoid simple carbohydrates   from their diet including  Cakes , Desserts, Ice Cream,  Soda (  diet and regular) , Sweet Tea , Candies,  Chips, Cookies, Artificial Sweeteners,   and "Sugar-free" Products .  This will help patient to have stable blood glucose profile and potentially avoid unintended  Weight gain.  - Patient is advised to stick to a routine mealtimes to eat 3 meals  a day and avoid unnecessary snacks ( to snack only to correct hypoglycemia).  - The patient  has been  scheduled with Jearld Fenton, RDN, CDE for individualized DM education.  - I have approached patient with the following individualized plan to manage diabetes and patient reluctantly accepts .  -Since he does not have adequate engagement for safe use, I will discontinue NovoLog. I urged him to continue Lantus 18 units daily at bedtime associated with monitoring of BG AC  Breakfast and daily at bedtime  .  -I will continue metformin 500 mg by mouth twice a day.  - He is not a good candidate for incretin therapy . - Patient specific target  for A1c; LDL, HDL, Triglycerides, and  Waist Circumference were discussed in detail.  2) BP/HTN: Uncontrolled. Continue current medications. 3)  Weight/Diet: CDE consult in progress, exercise, and carbohydrates information provided. 4) medical noncompliance: I counseled him to refocus for better care of diabetes. 5) Chronic Care/Health Maintenance:  -Patient  Is on Statin medications and encouraged to continue to follow up with Ophthalmology, Podiatrist at least yearly or according to recommendations, and advised to quit smoking. I have recommended yearly flu vaccine and pneumonia vaccination at least every 5 years; moderate intensity exercise for up to 150 minutes weekly; and  sleep for at least 7 hours a day.  - 25 minutes of time was spent on the care of this patient , 50% of which was applied for counseling on diabetes complications and their preventions.  - I advised patient to maintain close follow up with [facility], MD for  primary care needs.  Patient is asked to bring meter and  blood glucose logs during their next visit.   Follow up plan: -Return in about 3 months (around 01/26/2016) for diabetes, high blood pressure, high cholesterol, follow up with pre-visit labs, meter, and logs.  Glade Lloyd, MD Phone: [phone]  Fax: [phone]   10/26/2015, 9:55 AM

## 2015-10-27 ENCOUNTER — Encounter (HOSPITAL_COMMUNITY): Payer: Self-pay | Admitting: Emergency Medicine

## 2015-10-27 ENCOUNTER — Emergency Department (HOSPITAL_COMMUNITY)
Admission: EM | Admit: 2015-10-27 | Discharge: 2015-10-27 | Disposition: A | Discharge status: Home / Self Care | Payer: Medicaid Other | Attending: Emergency Medicine | Admitting: Emergency Medicine

## 2015-10-27 DIAGNOSIS — G8929 Other chronic pain: Secondary | ICD-10-CM | POA: Diagnosis not present

## 2015-10-27 DIAGNOSIS — F209 Schizophrenia, unspecified: Secondary | ICD-10-CM | POA: Insufficient documentation

## 2015-10-27 DIAGNOSIS — F17210 Nicotine dependence, cigarettes, uncomplicated: Secondary | ICD-10-CM | POA: Diagnosis not present

## 2015-10-27 DIAGNOSIS — M542 Cervicalgia: Secondary | ICD-10-CM | POA: Diagnosis present

## 2015-10-27 DIAGNOSIS — F329 Major depressive disorder, single episode, unspecified: Secondary | ICD-10-CM | POA: Diagnosis not present

## 2015-10-27 DIAGNOSIS — M5090 Cervical disc disorder, unspecified, unspecified cervical region: Secondary | ICD-10-CM | POA: Diagnosis not present

## 2015-10-27 DIAGNOSIS — E1140 Type 2 diabetes mellitus with diabetic neuropathy, unspecified: Secondary | ICD-10-CM | POA: Insufficient documentation

## 2015-10-27 DIAGNOSIS — M5030 Other cervical disc degeneration, unspecified cervical region: Secondary | ICD-10-CM

## 2015-10-27 DIAGNOSIS — Z79899 Other long term (current) drug therapy: Secondary | ICD-10-CM | POA: Insufficient documentation

## 2015-10-27 DIAGNOSIS — Z794 Long term (current) use of insulin: Secondary | ICD-10-CM | POA: Diagnosis not present

## 2015-10-27 DIAGNOSIS — Z7984 Long term (current) use of oral hypoglycemic drugs: Secondary | ICD-10-CM | POA: Insufficient documentation

## 2015-10-27 DIAGNOSIS — I10 Essential (primary) hypertension: Secondary | ICD-10-CM | POA: Diagnosis not present

## 2015-10-27 MED ORDER — DIAZEPAM 5 MG PO TABS
10.0000 mg | ORAL_TABLET | Freq: Once | ORAL | Status: AC
  Administered 2015-10-27: 10 mg via ORAL
  Filled 2015-10-27: qty 2

## 2015-10-27 MED ORDER — ONDANSETRON HCL 4 MG PO TABS
4.0000 mg | ORAL_TABLET | Freq: Once | ORAL | Status: AC
  Administered 2015-10-27: 4 mg via ORAL
  Filled 2015-10-27: qty 1

## 2015-10-27 MED ORDER — DEXAMETHASONE SODIUM PHOSPHATE 4 MG/ML IJ SOLN
8.0000 mg | Freq: Once | INTRAMUSCULAR | Status: AC
  Administered 2015-10-27: 8 mg via INTRAMUSCULAR
  Filled 2015-10-27: qty 2

## 2015-10-27 MED ORDER — METHOCARBAMOL 500 MG PO TABS: 500.0000 mg | ORAL_TABLET | Freq: Three times a day (TID) | ORAL | Status: DC

## 2015-10-27 MED ORDER — TRAMADOL HCL 50 MG PO TABS: 50.0000 mg | ORAL_TABLET | Freq: Four times a day (QID) | ORAL | Status: DC | PRN

## 2015-10-27 MED ORDER — TRAMADOL HCL 50 MG PO TABS
50.0000 mg | ORAL_TABLET | Freq: Once | ORAL | Status: AC
  Administered 2015-10-27: 50 mg via ORAL
  Filled 2015-10-27: qty 1

## 2015-10-27 NOTE — ED Provider Notes (Signed)
CSN: [id number]     Arrival date & time 10/27/15  [id number] History   First MD Initiated Contact with Patient 10/27/15 1044     Chief Complaint  Patient presents with  . Neck Pain     (Consider location/radiation/quality/duration/timing/severity/associated sxs/prior Treatment) Patient is a 50 y.o. male presenting with neck pain. The history is provided by the patient.  Neck Pain Pain location:  Generalized neck Quality:  Aching Pain radiates to:  L shoulder Pain severity:  Moderate Pain is:  Same all the time Onset quality:  Gradual Timing:  Intermittent Progression:  Worsening Chronicity:  Chronic Relieved by:  Nothing Ineffective treatments:  None tried Associated symptoms: no bowel incontinence, no fever and no weakness   Risk factors: no recurrent falls     Past Medical History  Diagnosis Date  . Anxiety   . Depression   . Diabetes mellitus   . HTN (hypertension)   . Sleep apnea   . Bipolar 1 disorder ([place])   . Insomnia   . Headache(784.0)   . Schizophrenia ([place])   . Hallucinations     "long history of them"  . Seizures ([place])     last sz between July 5-9th, 2016; epilepsy  . History of gout   . Neuropathy ([place])   . Chronic back pain   . Pain management    Past Surgical History  Procedure Laterality Date  . Mandible fracture surgery    . Knee surgery Left     arthroscopy  . Tonsillectomy    . Colonoscopy with propofol N/A 05/07/2015    [id number] diverticulosis, multiple colon polyps removed, tubular adenoma, serrated colon polyp. Next colonoscopy October 2019  . Esophagogastroduodenoscopy (egd) with propofol N/A 05/07/2015    RMR: Status post dilation of normal esophagus. Gastritis.  . Esophageal dilation N/A 05/07/2015    Procedure: ESOPHAGEAL DILATION WITH 56FR MALONEY DILATOR;  Surgeon: [name], MD;  Location: [facility];  Service: Endoscopy;  Laterality: N/A;  . Biopsy  05/07/2015    Procedure: BIOPSY (Gastric);  Surgeon: [name], MD;   Location: [facility];  Service: Endoscopy;;  . Polypectomy  05/07/2015    Procedure: POLYPECTOMY (Hepatic Flexure, Distal Transverse Colon, Rectal);  Surgeon: [name], MD;  Location: [facility];  Service: Endoscopy;;   Family History  Problem Relation Age of Onset  . Arthritis    . Diabetes    . Asthma    . Diabetes Father   . Hypertension Father   . Stroke Sister    Social History  Substance Use Topics  . Smoking status: Current Every Day Smoker -- 1.00 packs/day for 30 years    Types: Cigarettes  . Smokeless tobacco: Never Used     Comment: 09/21/15 trying to quit, < 1 PPD  . Alcohol Use: Yes     Comment: 02/11/15 "drink a beer every now and then"    Review of Systems  Constitutional: Negative for fever.  Gastrointestinal: Negative for bowel incontinence.  Musculoskeletal: Positive for back pain and neck pain.  Neurological: Negative for weakness.  Psychiatric/Behavioral: The patient is nervous/anxious.   All other systems reviewed and are negative.     Allergies  Aspirin  Home Medications   Prior to Admission medications   Medication Sig Start Date End Date Taking? Authorizing Provider  atenolol (TENORMIN) 100 MG tablet Take 100 mg by mouth daily.      Historical Provider, MD  diazepam (VALIUM) 5 MG tablet Take 1 tablet (5 mg total)  by mouth every 6 (six) hours as needed for anxiety. 09/24/15   Lily Kocher, PA-C  divalproex (DEPAKOTE) 500 MG DR tablet Take 3 tablets (1,500 mg total) by mouth 2 (two) times daily. 09/21/15   Penni Bombard, MD  glucose blood (ACCU-CHEK AVIVA) test strip Use to test 2 times a day 10/26/15   Cassandria Anger, MD  Insulin Glargine (LANTUS SOLOSTAR) 100 UNIT/ML Solostar Pen Inject 18 Units into the skin at bedtime. 10/26/15   Cassandria Anger, MD  levETIRAcetam (KEPPRA) 500 MG tablet Take 2 tablets (1,000 mg total) by mouth 2 (two) times daily. 09/21/15   Penni Bombard, MD  metFORMIN (GLUCOPHAGE) 500 MG tablet Take 1 tablet (500  mg total) by mouth 2 (two) times daily with a meal. 07/23/15   Cassandria Anger, MD  omeprazole (PRILOSEC) 20 MG capsule Take 1 capsule (20 mg total) by mouth daily. 06/08/15   Carlis Stable, NP   BP 153/100 mmHg  Pulse 60  Temp(Src) 98.2 F (36.8 C) (Oral)  Resp 16  Ht 5\' 7"  (1.702 m)  Wt 70.308 kg  BMI 24.27 kg/m2  SpO2 100% Physical Exam  Constitutional: He is oriented to person, place, and time. He appears well-developed and well-nourished.  Non-toxic appearance.  HENT:  Head: Normocephalic.  Right Ear: Tympanic membrane and external ear normal.  Left Ear: Tympanic membrane and external ear normal.  Eyes: EOM and lids are normal. Pupils are equal, round, and reactive to light.  Neck: Normal range of motion. Neck supple. Carotid bruit is not present.  Cardiovascular: Normal rate, regular rhythm, normal heart sounds, intact distal pulses and normal pulses.   Pulmonary/Chest: Breath sounds normal. No respiratory distress.  Abdominal: Soft. Bowel sounds are normal. There is no tenderness. There is no guarding.  Musculoskeletal: Normal range of motion.       Cervical back: He exhibits tenderness, pain and spasm. He exhibits no deformity.  No step off of the C spine. No hot areas.  Lymphadenopathy:       Head (right side): No submandibular adenopathy present.       Head (left side): No submandibular adenopathy present.    He has no cervical adenopathy.  Neurological: He is alert and oriented to person, place, and time. He has normal strength. No cranial nerve deficit or sensory deficit.  No upper extremity weakness. Grip symmetrical. Gait steady.  Skin: Skin is warm and dry.  Psychiatric: He has a normal mood and affect. His speech is normal.  Nursing note and vitals reviewed.   ED Course  Procedures (including critical care time) Labs Review Labs Reviewed - No data to display  Imaging Review No results found. I have personally reviewed and evaluated these images and lab  results as part of my medical decision-making.   EKG Interpretation None      MDM  No gross neurologic deficits appreciated at this time. The patient states that he was being seen by a pain management specialist, but he is not seeing that person now. He complains of pain from the neck into the left shoulder. There no gross neurologic deficits appreciated on today's examination.  The patient is to see Dr. Legrand Rams for pain management, and the specialist concerning the neck pain. Prescription for Robaxin and Ultram given to the patient. Injection of Decadron was also given in the emergency department. The patient is in agreement with this discharge plan.    Final diagnoses:  None    *I have reviewed  nursing notes, vital signs, and all appropriate lab and imaging results for this patient.[address] PA-C 10/27/15 1219  Ripley Fraise, MD 10/27/15 [phone]

## 2015-10-27 NOTE — ED Notes (Signed)
History of neck pain.  Increased pain this am.  Rates pain 9/10.  Did not take any pain medication this am.

## 2015-10-27 NOTE — Discharge Instructions (Signed)
It is important that you see Dr. Legrand Rams and your specialist concerning her neck. Review of your imaging studies shows that you have some degenerative disc disease changes and arthritis changes involving her neck. Please use Robaxin and Ultram for assistance with your discomfort. Please call Dr. Legrand Rams today to set up an appointment time for your pain management until you're seen by the specialist. Robaxin and Ultram may cause drowsiness, please do not drink, drive vehicles, operate machinery, or participate in activities requiring concentration when taking these medications. Degenerative Disk Disease Degenerative disk disease is a condition caused by the changes that occur in spinal disks as you grow older. Spinal disks are soft and compressible disks located between the bones of your spine (vertebrae). These disks act like shock absorbers. Degenerative disk disease can affect the whole spine. However, the neck and lower back are most commonly affected. Many changes can occur in the spinal disks with aging, such as:  The spinal disks may dry and shrink.  Small tears may occur in the tough, outer covering of the disk (annulus).  The disk space may become smaller due to loss of water.  Abnormal growths in the bone (spurs) may occur. This can put pressure on the nerve roots exiting the spinal canal, causing pain.  The spinal canal may become narrowed. RISK FACTORS   Being overweight.  Having a family history of degenerative disk disease.  Smoking.  There is increased risk if you are doing heavy lifting or have a sudden injury. SIGNS AND SYMPTOMS  Symptoms vary from person to person and may include:  Pain that varies in intensity. Some people have no pain, while others have severe pain. The location of the pain depends on the part of your backbone that is affected.  You will have neck or arm pain if a disk in the neck area is affected.  You will have pain in your back, buttocks, or legs if a  disk in the lower back is affected.  Pain that becomes worse while bending, reaching up, or with twisting movements.  Pain that may start gradually and then get worse as time passes. It may also start after a major or minor injury.  Numbness or tingling in the arms or legs. DIAGNOSIS  Your health care provider will ask you about your symptoms and about activities or habits that may cause the pain. He or she may also ask about any injuries, diseases, or treatments you have had. Your health care provider will examine you to check for the range of movement that is possible in the affected area, to check for strength in your extremities, and to check for sensation in the areas of the arms and legs supplied by different nerve roots. You may also have:   An X-ray of the spine.  Other imaging tests, such as MRI. TREATMENT  Your health care provider will advise you on the best plan for treatment. Treatment may include:  Medicines.  Rehabilitation exercises. HOME CARE INSTRUCTIONS   Follow proper lifting and walking techniques as advised by your health care provider.  Maintain good posture.  Exercise regularly as advised by your health care provider.  Perform relaxation exercises.  Change your sitting, standing, and sleeping habits as advised by your health care provider.  Change positions frequently.  Lose weight or maintain a healthy weight as advised by your health care provider.  Do not use any tobacco products, including cigarettes, chewing tobacco, or electronic cigarettes. If you need help quitting, ask  your health care provider.  Wear supportive footwear.  Take medicines only as directed by your health care provider. SEEK MEDICAL CARE IF:   Your pain does not go away within 1-4 weeks.  You have significant appetite or weight loss. SEEK IMMEDIATE MEDICAL CARE IF:   Your pain is severe.  You notice weakness in your arms, hands, or legs.  You begin to lose control of  your bladder or bowel movements.  You have fevers or night sweats. MAKE SURE YOU:   Understand these instructions.  Will watch your condition.  Will get help right away if you are not doing well or get worse.   This information is not intended to replace advice given to you by your health care provider. Make sure you discuss any questions you have with your health care provider.   Document Released: 05/15/2007 Document Revised: 08/08/2014 Document Reviewed: 11/19/2013 Elsevier Interactive Patient Education [employer].

## 2015-11-09 ENCOUNTER — Encounter (HOSPITAL_COMMUNITY): Payer: Self-pay | Admitting: *Deleted

## 2015-11-09 ENCOUNTER — Emergency Department (HOSPITAL_COMMUNITY)
Admission: EM | Admit: 2015-11-09 | Discharge: 2015-11-09 | Disposition: A | Discharge status: Home / Self Care | Payer: Medicaid Other | Attending: Emergency Medicine | Admitting: Emergency Medicine

## 2015-11-09 DIAGNOSIS — Z79899 Other long term (current) drug therapy: Secondary | ICD-10-CM | POA: Insufficient documentation

## 2015-11-09 DIAGNOSIS — R030 Elevated blood-pressure reading, without diagnosis of hypertension: Secondary | ICD-10-CM

## 2015-11-09 DIAGNOSIS — Z794 Long term (current) use of insulin: Secondary | ICD-10-CM | POA: Diagnosis not present

## 2015-11-09 DIAGNOSIS — M5412 Radiculopathy, cervical region: Secondary | ICD-10-CM | POA: Diagnosis not present

## 2015-11-09 DIAGNOSIS — F209 Schizophrenia, unspecified: Secondary | ICD-10-CM | POA: Insufficient documentation

## 2015-11-09 DIAGNOSIS — F329 Major depressive disorder, single episode, unspecified: Secondary | ICD-10-CM | POA: Diagnosis not present

## 2015-11-09 DIAGNOSIS — F17210 Nicotine dependence, cigarettes, uncomplicated: Secondary | ICD-10-CM | POA: Diagnosis not present

## 2015-11-09 DIAGNOSIS — I10 Essential (primary) hypertension: Secondary | ICD-10-CM | POA: Diagnosis not present

## 2015-11-09 DIAGNOSIS — Z7984 Long term (current) use of oral hypoglycemic drugs: Secondary | ICD-10-CM | POA: Insufficient documentation

## 2015-11-09 DIAGNOSIS — IMO0001 Reserved for inherently not codable concepts without codable children: Secondary | ICD-10-CM

## 2015-11-09 DIAGNOSIS — E1140 Type 2 diabetes mellitus with diabetic neuropathy, unspecified: Secondary | ICD-10-CM | POA: Insufficient documentation

## 2015-11-09 DIAGNOSIS — M542 Cervicalgia: Secondary | ICD-10-CM | POA: Diagnosis present

## 2015-11-09 MED ORDER — OXYCODONE-ACETAMINOPHEN 5-325 MG PO TABS
1.0000 | ORAL_TABLET | Freq: Once | ORAL | Status: AC
  Administered 2015-11-09: 1 via ORAL
  Filled 2015-11-09: qty 1

## 2015-11-09 MED ORDER — PREDNISONE 10 MG PO TABS: ORAL_TABLET | ORAL | Status: DC

## 2015-11-09 MED ORDER — OXYCODONE-ACETAMINOPHEN 5-325 MG PO TABS: 1.0000 | ORAL_TABLET | ORAL | Status: DC | PRN

## 2015-11-09 MED ORDER — PREDNISONE 50 MG PO TABS
60.0000 mg | ORAL_TABLET | Freq: Once | ORAL | Status: AC
  Administered 2015-11-09: 60 mg via ORAL
  Filled 2015-11-09: qty 1

## 2015-11-09 NOTE — ED Notes (Signed)
Pt states that he is having left neck pain that moves down his entire body. Pt states these symptoms have been going on for 2 months. He was seen here and told he had a disc problem, he follows up with a specialist on Thursday. Comes in today because his symptoms are worse.

## 2015-11-09 NOTE — Discharge Instructions (Signed)
Cervical Radiculopathy Cervical radiculopathy happens when a nerve in the neck (cervical nerve) is pinched or bruised. This condition can develop because of an injury or as part of the normal aging process. Pressure on the cervical nerves can cause pain or numbness that runs from the neck all the way down into the arm and fingers. Usually, this condition gets better with rest. Treatment may be needed if the condition does not improve.  CAUSES This condition may be caused by:  Injury.  Slipped (herniated) disk.  Muscle tightness in the neck because of overuse.  Arthritis.  Breakdown or degeneration in the bones and joints of the spine (spondylosis) due to aging.  Bone spurs that may develop near the cervical nerves. SYMPTOMS Symptoms of this condition include:  Pain that runs from the neck to the arm and hand. The pain can be severe or irritating. It may be worse when the neck is moved.  Numbness or weakness in the affected arm and hand. DIAGNOSIS This condition may be diagnosed based on symptoms, medical history, and a physical exam. You may also have tests, including:  X-rays.  CT scan.  MRI.  Electromyogram (EMG).  Nerve conduction tests. TREATMENT In many cases, treatment is not needed for this condition. With rest, the condition usually gets better over time. If treatment is needed, options may include:  Wearing a soft neck collar for short periods of time.  Physical therapy to strengthen your neck muscles.  Medicines, such as NSAIDs, oral corticosteroids, or spinal injections.  Surgery. This may be needed if other treatments do not help. Various types of surgery may be done depending on the cause of your problems. HOME CARE INSTRUCTIONS Managing Pain  Take over-the-counter and prescription medicines only as told by your health care provider.  If directed, apply ice to the affected area.  Put ice in a plastic bag.  Place a towel between your skin and the  bag.  Leave the ice on for 20 minutes, 2-3 times per day.  If ice does not help, you can try using heat. Take a warm shower or warm bath, or use a heat pack as told by your health care provider.  Try a gentle neck and shoulder massage to help relieve symptoms. Activity  Rest as needed. Follow instructions from your health care provider about any restrictions on activities.  Do stretching and strengthening exercises as told by your health care provider or physical therapist. General Instructions  If you were given a soft collar, wear it as told by your health care provider.  Use a flat pillow when you sleep.  Keep all follow-up visits as told by your health care provider. This is important. SEEK MEDICAL CARE IF:  Your condition does not improve with treatment. SEEK IMMEDIATE MEDICAL CARE IF:  Your pain gets much worse and cannot be controlled with medicines.  You have weakness or numbness in your hand, arm, face, or leg.  You have a high fever.  You have a stiff, rigid neck.  You lose control of your bowels or your bladder (have incontinence).  You have trouble with walking, balance, or speaking.   This information is not intended to replace advice given to you by your health care provider. Make sure you discuss any questions you have with your health care provider.   Document Released: 04/12/2001 Document Revised: 04/08/2015 Document Reviewed: 09/11/2014 Elsevier Interactive Patient Education 2016 [place] your next dose of prednisone tomorrow morning.  You may  take the percocet prescribed for pain relief.  This will make you drowsy - do not drive within 4 hours of taking this medication.  You should have your blood pressure rechecked within the next several days as it is elevated today at 184/107.  Call your doctor for a nurse visit for a blood pressure check once your pain is better.
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
## 2015-11-20 ENCOUNTER — Emergency Department (HOSPITAL_COMMUNITY)
Admission: EM | Admit: 2015-11-20 | Discharge: 2015-11-20 | Disposition: A | Discharge status: Home / Self Care | Payer: Medicaid Other | Attending: Emergency Medicine | Admitting: Emergency Medicine

## 2015-11-20 ENCOUNTER — Encounter (HOSPITAL_COMMUNITY): Payer: Self-pay

## 2015-11-20 DIAGNOSIS — Z7984 Long term (current) use of oral hypoglycemic drugs: Secondary | ICD-10-CM | POA: Diagnosis not present

## 2015-11-20 DIAGNOSIS — Z794 Long term (current) use of insulin: Secondary | ICD-10-CM | POA: Insufficient documentation

## 2015-11-20 DIAGNOSIS — F209 Schizophrenia, unspecified: Secondary | ICD-10-CM | POA: Diagnosis not present

## 2015-11-20 DIAGNOSIS — F17210 Nicotine dependence, cigarettes, uncomplicated: Secondary | ICD-10-CM | POA: Insufficient documentation

## 2015-11-20 DIAGNOSIS — Z79899 Other long term (current) drug therapy: Secondary | ICD-10-CM | POA: Diagnosis not present

## 2015-11-20 DIAGNOSIS — G8929 Other chronic pain: Secondary | ICD-10-CM | POA: Diagnosis not present

## 2015-11-20 DIAGNOSIS — M542 Cervicalgia: Secondary | ICD-10-CM | POA: Diagnosis present

## 2015-11-20 DIAGNOSIS — E1140 Type 2 diabetes mellitus with diabetic neuropathy, unspecified: Secondary | ICD-10-CM | POA: Insufficient documentation

## 2015-11-20 DIAGNOSIS — F319 Bipolar disorder, unspecified: Secondary | ICD-10-CM | POA: Diagnosis not present

## 2015-11-20 DIAGNOSIS — I10 Essential (primary) hypertension: Secondary | ICD-10-CM

## 2015-11-20 MED ORDER — METHOCARBAMOL 500 MG PO TABS: 500.0000 mg | ORAL_TABLET | Freq: Three times a day (TID) | ORAL | Status: DC

## 2015-11-20 MED ORDER — TRAMADOL HCL 50 MG PO TABS: 50.0000 mg | ORAL_TABLET | Freq: Four times a day (QID) | ORAL | Status: DC | PRN

## 2015-11-20 NOTE — Discharge Instructions (Signed)

## 2015-11-20 NOTE — ED Notes (Signed)
Pt c/o pain in left side of neck.  Reports was seen here last week and was told he has a slipped disc.  Pt f/o with neurosurgeon in [place] and is waiting for medicaid to approve an MRI.

## 2015-11-20 NOTE — ED Provider Notes (Signed)
CSN: [id number]     Arrival date & time 11/20/15  0849 History   First MD Initiated Contact with Patient 11/20/15 0913     Chief Complaint  Patient presents with  . Neck Pain     (Consider location/radiation/quality/duration/timing/severity/associated sxs/prior Treatment) Patient is a 50 y.o. male presenting with neck pain. The history is provided by the patient. No language interpreter was used.  Neck Pain Pain location:  Generalized neck Quality:  Aching and stiffness Stiffness is present:  All day Pain radiates to:  Does not radiate Pain severity:  Moderate Pain is:  Same all the time Onset quality:  Gradual Timing:  Constant Progression:  Worsening Chronicity:  New Relieved by:  Nothing Worsened by:  Nothing tried Associated symptoms: tingling   Associated symptoms: no weakness   Risk factors: no hx of head and neck radiation    Pt reports he has seen dr. Annette Stable and was advised that he needs an MRI.  Pt reports he was not given any medications by Dr. Annette Stable.  Pt reports he saw Dr. Legrand Rams and his blood pressure was fine.  Past Medical History  Diagnosis Date  . Anxiety   . Depression   . Diabetes mellitus   . HTN (hypertension)   . Sleep apnea   . Bipolar 1 disorder ([place])   . Insomnia   . Headache(784.0)   . Schizophrenia ([place])   . Hallucinations     "long history of them"  . Seizures ([place])     last sz between July 5-9th, 2016; epilepsy  . History of gout   . Neuropathy ([place])   . Chronic back pain   . Pain management    Past Surgical History  Procedure Laterality Date  . Mandible fracture surgery    . Knee surgery Left     arthroscopy  . Tonsillectomy    . Colonoscopy with propofol N/A 05/07/2015    [id number] diverticulosis, multiple colon polyps removed, tubular adenoma, serrated colon polyp. Next colonoscopy October 2019  . Esophagogastroduodenoscopy (egd) with propofol N/A 05/07/2015    RMR: Status post dilation of normal esophagus. Gastritis.  .  Esophageal dilation N/A 05/07/2015    Procedure: ESOPHAGEAL DILATION WITH 56FR MALONEY DILATOR;  Surgeon: [name], MD;  Location: [facility];  Service: Endoscopy;  Laterality: N/A;  . Biopsy  05/07/2015    Procedure: BIOPSY (Gastric);  Surgeon: [name], MD;  Location: [facility];  Service: Endoscopy;;  . Polypectomy  05/07/2015    Procedure: POLYPECTOMY (Hepatic Flexure, Distal Transverse Colon, Rectal);  Surgeon: [name], MD;  Location: [facility];  Service: Endoscopy;;   Family History  Problem Relation Age of Onset  . Arthritis    . Diabetes    . Asthma    . Diabetes Father   . Hypertension Father   . Stroke Sister    Social History  Substance Use Topics  . Smoking status: Current Every Day Smoker -- 1.00 packs/day for 30 years    Types: Cigarettes  . Smokeless tobacco: Never Used     Comment: 09/21/15 trying to quit, < 1 PPD  . Alcohol Use: Yes     Comment: 02/11/15 "drink a beer every now and then"    Review of Systems  Musculoskeletal: Positive for neck pain.  Neurological: Positive for tingling. Negative for weakness.  All other systems reviewed and are negative.     Allergies  Aspirin  Home Medications   Prior to Admission medications   Medication Sig  Start Date End Date Taking? Authorizing Provider  atenolol (TENORMIN) 100 MG tablet Take 100 mg by mouth daily.      Historical Provider, MD  diazepam (VALIUM) 5 MG tablet Take 1 tablet (5 mg total) by mouth every 6 (six) hours as needed for anxiety. 09/24/15   Lily Kocher, PA-C  divalproex (DEPAKOTE) 500 MG DR tablet Take 3 tablets (1,500 mg total) by mouth 2 (two) times daily. 09/21/15   Penni Bombard, MD  glucose blood (ACCU-CHEK AVIVA) test strip Use to test 2 times a day 10/26/15   Cassandria Anger, MD  Insulin Glargine (LANTUS SOLOSTAR) 100 UNIT/ML Solostar Pen Inject 18 Units into the skin at bedtime. 10/26/15   Cassandria Anger, MD  levETIRAcetam (KEPPRA) 500 MG tablet Take 2 tablets (1,000 mg  total) by mouth 2 (two) times daily. 09/21/15   Penni Bombard, MD  metFORMIN (GLUCOPHAGE) 500 MG tablet Take 1 tablet (500 mg total) by mouth 2 (two) times daily with a meal. 07/23/15   Cassandria Anger, MD  methocarbamol (ROBAXIN) 500 MG tablet Take 1 tablet (500 mg total) by mouth 3 (three) times daily. 11/20/15   Fransico Meadow, PA-C  omeprazole (PRILOSEC) 20 MG capsule Take 1 capsule (20 mg total) by mouth daily. 06/08/15   Carlis Stable, NP  oxyCODONE-acetaminophen (PERCOCET/ROXICET) 5-325 MG tablet Take 1 tablet by mouth every 4 (four) hours as needed. 11/09/15   Evalee Jefferson, PA-C  predniSONE (DELTASONE) 10 MG tablet 5, 4, 3, 2 then 1 tablet by mouth daily for 6 days total. 11/09/15   Evalee Jefferson, PA-C  traMADol (ULTRAM) 50 MG tablet Take 1 tablet (50 mg total) by mouth every 6 (six) hours as needed. 11/20/15   Fransico Meadow, PA-C   BP 213/119 mmHg  Pulse 58  Temp(Src) 98.7 F (37.1 C) (Oral)  Resp 18  Ht 5\' 7"  (1.702 m)  Wt 70.761 kg  BMI 24.43 kg/m2  SpO2 97% Physical Exam  Constitutional: He is oriented to person, place, and time. He appears well-developed and well-nourished.  HENT:  Head: Normocephalic and atraumatic.  Eyes: EOM are normal.  Neck: Normal range of motion.  Cardiovascular: Normal rate.   Pulmonary/Chest: Effort normal.  Abdominal: He exhibits no distension.  Musculoskeletal: Normal range of motion.  Neurological: He is alert and oriented to person, place, and time.  Psychiatric: He has a normal mood and affect.  Nursing note and vitals reviewed.   ED Course  Procedures (including critical care time) Labs Review Labs Reviewed - No data to display  Imaging Review No results found. I have personally reviewed and evaluated these images and lab results as part of my medical decision-making.   EKG Interpretation None      MDM This is pt 3rd visit for pain medication in 3 months.  He reports his primary will not give him pain medication.  Pt reports no  treatment by Dr. Annette Stable.  I discussed with him his expectations of ED visits.  He is here for pain medication only.   I advised pt ED is not appropriate for chronic pain management.  (injury was 20 years ago) Pt reports we diagnosed him with a slipped disc here.  There is no documentation that this was diagnosed here.  I will not pursue this as he says he is seeing Dr. Annette Stable.  I have advised him I will give his robaxin and  tramadol today.  He must see Dr. Legrand Rams for pain managanment or  referrral to pain management.  Pt's blood pressure remains high.  I suspect noncompliance with medications.  Patient may benefit from care plan. I will discuss with Dr. Christy Gentles   Final diagnoses:  Chronic neck pain    Tramadol Robaxin An After Visit Summary was printed and given to the patient.    Hollace Kinnier Stewart, PA-C 11/20/15 1024  Ripley Fraise, MD 11/20/15 1600

## 2015-12-07 ENCOUNTER — Telehealth: Payer: Self-pay | Admitting: Diagnostic Neuroimaging

## 2015-12-07 NOTE — Telephone Encounter (Signed)
I called patient. The patient is being followed by Dr. Trenton Gammon for neck pain and pain down both arms. A MRI of the cervical spine was ordered, but was denied. Our office has not been to do this evaluation or workup. The patient was told that another study, possibly a myelogram would be ordered to evaluate the cervical spine. I am not sure why the patient called this office concerning the workup.

## 2015-12-07 NOTE — Telephone Encounter (Signed)
Pt called back, said he can also be reached at [phone].

## 2015-12-07 NOTE — Telephone Encounter (Signed)
Pt called in says he went to [facility]. They wanted to do their own MRI however, pts insurance will not cover another MRI. Pt is complaining of pain. Please call and advise [phone]

## 2015-12-10 ENCOUNTER — Other Ambulatory Visit: Payer: Self-pay | Admitting: Neurosurgery

## 2015-12-11 ENCOUNTER — Encounter (HOSPITAL_COMMUNITY): Payer: Self-pay | Admitting: *Deleted

## 2015-12-11 NOTE — Progress Notes (Addendum)
Patient would not take information regarding how to treat a low CBG.  "It will be okay,  It will not be low."  I instructed patient to take 9 units of Lantus on Sunday night.  PATIENT States he was tested for [facility] [place] many years ago, uses CPAP sometimes.  Patient stated that he will not bring CPAP mask in.

## 2015-12-13 MED ORDER — DEXAMETHASONE SODIUM PHOSPHATE 10 MG/ML IJ SOLN
10.0000 mg | INTRAMUSCULAR | Status: AC
  Administered 2015-12-14: 10 mg via INTRAVENOUS
  Filled 2015-12-13: qty 1

## 2015-12-13 MED ORDER — CEFAZOLIN SODIUM-DEXTROSE 2-4 GM/100ML-% IV SOLN
2.0000 g | INTRAVENOUS | Status: AC
  Administered 2015-12-14: 2 g via INTRAVENOUS
  Filled 2015-12-13: qty 100

## 2015-12-14 ENCOUNTER — Encounter (HOSPITAL_COMMUNITY): Payer: Self-pay | Admitting: Certified Registered Nurse Anesthetist

## 2015-12-14 ENCOUNTER — Ambulatory Visit (HOSPITAL_COMMUNITY): Payer: Medicaid Other | Admitting: Anesthesiology

## 2015-12-14 ENCOUNTER — Encounter (HOSPITAL_COMMUNITY)
Admission: RE | Disposition: A | Discharge status: Home / Self Care | Payer: Self-pay | Source: Ambulatory Visit | Attending: Neurosurgery

## 2015-12-14 ENCOUNTER — Ambulatory Visit (HOSPITAL_COMMUNITY): Payer: Medicaid Other

## 2015-12-14 ENCOUNTER — Observation Stay (HOSPITAL_COMMUNITY)
Admission: RE | Admit: 2015-12-14 | Discharge: 2015-12-14 | Disposition: A | Discharge status: Home / Self Care | Payer: Medicaid Other | Source: Ambulatory Visit | Attending: Neurosurgery | Admitting: Neurosurgery

## 2015-12-14 DIAGNOSIS — R569 Unspecified convulsions: Secondary | ICD-10-CM | POA: Diagnosis not present

## 2015-12-14 DIAGNOSIS — E119 Type 2 diabetes mellitus without complications: Secondary | ICD-10-CM | POA: Diagnosis not present

## 2015-12-14 DIAGNOSIS — F419 Anxiety disorder, unspecified: Secondary | ICD-10-CM | POA: Diagnosis not present

## 2015-12-14 DIAGNOSIS — M4722 Other spondylosis with radiculopathy, cervical region: Principal | ICD-10-CM | POA: Insufficient documentation

## 2015-12-14 DIAGNOSIS — M4802 Spinal stenosis, cervical region: Secondary | ICD-10-CM | POA: Diagnosis present

## 2015-12-14 DIAGNOSIS — Z419 Encounter for procedure for purposes other than remedying health state, unspecified: Secondary | ICD-10-CM

## 2015-12-14 DIAGNOSIS — F17210 Nicotine dependence, cigarettes, uncomplicated: Secondary | ICD-10-CM | POA: Diagnosis not present

## 2015-12-14 DIAGNOSIS — Z9119 Patient's noncompliance with other medical treatment and regimen: Secondary | ICD-10-CM | POA: Insufficient documentation

## 2015-12-14 DIAGNOSIS — Z79899 Other long term (current) drug therapy: Secondary | ICD-10-CM | POA: Insufficient documentation

## 2015-12-14 DIAGNOSIS — Z794 Long term (current) use of insulin: Secondary | ICD-10-CM | POA: Diagnosis not present

## 2015-12-14 DIAGNOSIS — I10 Essential (primary) hypertension: Secondary | ICD-10-CM | POA: Insufficient documentation

## 2015-12-14 DIAGNOSIS — G4733 Obstructive sleep apnea (adult) (pediatric): Secondary | ICD-10-CM | POA: Diagnosis not present

## 2015-12-14 HISTORY — PX: ANTERIOR CERVICAL DECOMP/DISCECTOMY FUSION: SHX1161

## 2015-12-14 HISTORY — DX: Reserved for inherently not codable concepts without codable children: IMO0001

## 2015-12-14 LAB — CBC WITH DIFFERENTIAL/PLATELET
BASOS ABS: 0 10*3/uL (ref 0.0–0.1)
Basophils Relative: 0 %
EOS PCT: 2 %
Eosinophils Absolute: 0.1 10*3/uL (ref 0.0–0.7)
HCT: 41 % (ref 39.0–52.0)
Hemoglobin: 14.5 g/dL (ref 13.0–17.0)
LYMPHS ABS: 4.1 10*3/uL — AB (ref 0.7–4.0)
LYMPHS PCT: 57 %
MCH: 31.2 pg (ref 26.0–34.0)
MCHC: 35.4 g/dL (ref 30.0–36.0)
MCV: 88.2 fL (ref 78.0–100.0)
MONO ABS: 0.7 10*3/uL (ref 0.1–1.0)
Monocytes Relative: 10 %
Neutro Abs: 2.2 10*3/uL (ref 1.7–7.7)
Neutrophils Relative %: 31 %
PLATELETS: 194 10*3/uL (ref 150–400)
RBC: 4.65 MIL/uL (ref 4.22–5.81)
RDW: 12.2 % (ref 11.5–15.5)
WBC: 7.1 10*3/uL (ref 4.0–10.5)

## 2015-12-14 LAB — BASIC METABOLIC PANEL
Anion gap: 8 (ref 5–15)
BUN: 15 mg/dL (ref 6–20)
CO2: 27 mmol/L (ref 22–32)
Calcium: 8.8 mg/dL — ABNORMAL LOW (ref 8.9–10.3)
Chloride: 103 mmol/L (ref 101–111)
Creatinine, Ser: 0.98 mg/dL (ref 0.61–1.24)
GFR calc Af Amer: >60 mL/min (ref >60)
GLUCOSE: 62 mg/dL — AB (ref 65–99)
POTASSIUM: 3.8 mmol/L (ref 3.5–5.1)
Sodium: 138 mmol/L (ref 135–145)

## 2015-12-14 LAB — GLUCOSE, CAPILLARY
GLUCOSE-CAPILLARY: 70 mg/dL (ref 65–99)
GLUCOSE-CAPILLARY: 164 mg/dL — AB (ref 65–99)
Glucose-Capillary: 130 mg/dL — ABNORMAL HIGH (ref 65–99)

## 2015-12-14 LAB — SURGICAL PCR SCREEN
MRSA, PCR: NEGATIVE
Staphylococcus aureus: NEGATIVE

## 2015-12-14 SURGERY — ANTERIOR CERVICAL DECOMPRESSION/DISCECTOMY FUSION 1 LEVEL
Anesthesia: General

## 2015-12-14 MED ORDER — ONDANSETRON HCL 4 MG/2ML IJ SOLN
INTRAMUSCULAR | Status: DC | PRN
  Administered 2015-12-14: 4 mg via INTRAVENOUS

## 2015-12-14 MED ORDER — MIDAZOLAM HCL 2 MG/2ML IJ SOLN
INTRAMUSCULAR | Status: AC
  Filled 2015-12-14: qty 2

## 2015-12-14 MED ORDER — SODIUM CHLORIDE 0.9% FLUSH: 3.0000 mL | Freq: Two times a day (BID) | INTRAVENOUS | Status: DC

## 2015-12-14 MED ORDER — MUPIROCIN 2 % EX OINT
TOPICAL_OINTMENT | CUTANEOUS | Status: AC
  Filled 2015-12-14: qty 22

## 2015-12-14 MED ORDER — PHENOL 1.4 % MT LIQD
1.0000 | OROMUCOSAL | Status: DC | PRN
  Administered 2015-12-14: 1 via OROMUCOSAL
  Filled 2015-12-14: qty 177

## 2015-12-14 MED ORDER — CYCLOBENZAPRINE HCL 10 MG PO TABS: 10.0000 mg | ORAL_TABLET | Freq: Three times a day (TID) | ORAL | Status: DC | PRN

## 2015-12-14 MED ORDER — HYDROMORPHONE HCL 1 MG/ML IJ SOLN: 0.5000 mg | INTRAMUSCULAR | Status: DC | PRN

## 2015-12-14 MED ORDER — HYDROCODONE-ACETAMINOPHEN 10-325 MG PO TABS: 1.0000 | ORAL_TABLET | Freq: Four times a day (QID) | ORAL | Status: DC | PRN

## 2015-12-14 MED ORDER — PROPOFOL 10 MG/ML IV BOLUS
INTRAVENOUS | Status: DC | PRN
  Administered 2015-12-14: 200 mg via INTRAVENOUS

## 2015-12-14 MED ORDER — DEXAMETHASONE SODIUM PHOSPHATE 10 MG/ML IJ SOLN
INTRAMUSCULAR | Status: AC
  Filled 2015-12-14: qty 1

## 2015-12-14 MED ORDER — DEXTROSE 50 % IV SOLN
INTRAVENOUS | Status: AC
  Filled 2015-12-14: qty 50

## 2015-12-14 MED ORDER — FENTANYL CITRATE (PF) 100 MCG/2ML IJ SOLN
INTRAMUSCULAR | Status: DC | PRN
  Administered 2015-12-14 (×2): 100 ug via INTRAVENOUS
  Administered 2015-12-14: 50 ug via INTRAVENOUS

## 2015-12-14 MED ORDER — METHOCARBAMOL 500 MG PO TABS: 500.0000 mg | ORAL_TABLET | Freq: Three times a day (TID) | ORAL | Status: DC

## 2015-12-14 MED ORDER — OXYCODONE HCL 5 MG/5ML PO SOLN: 5.0000 mg | Freq: Once | ORAL | Status: DC | PRN

## 2015-12-14 MED ORDER — HEMOSTATIC AGENTS (NO CHARGE) OPTIME
TOPICAL | Status: DC | PRN
  Administered 2015-12-14: 1 via TOPICAL

## 2015-12-14 MED ORDER — ROCURONIUM BROMIDE 50 MG/5ML IV SOLN
INTRAVENOUS | Status: AC
  Filled 2015-12-14: qty 1

## 2015-12-14 MED ORDER — HYDROMORPHONE HCL 1 MG/ML IJ SOLN
0.2500 mg | INTRAMUSCULAR | Status: DC | PRN
  Administered 2015-12-14 (×2): 0.5 mg via INTRAVENOUS

## 2015-12-14 MED ORDER — FENTANYL CITRATE (PF) 250 MCG/5ML IJ SOLN
INTRAMUSCULAR | Status: AC
  Filled 2015-12-14: qty 5

## 2015-12-14 MED ORDER — ATENOLOL 100 MG PO TABS: 100.0000 mg | ORAL_TABLET | Freq: Every day | ORAL | Status: DC

## 2015-12-14 MED ORDER — HYDROMORPHONE HCL 1 MG/ML IJ SOLN
INTRAMUSCULAR | Status: AC
  Filled 2015-12-14: qty 1

## 2015-12-14 MED ORDER — PROPOFOL 10 MG/ML IV BOLUS
INTRAVENOUS | Status: AC
  Filled 2015-12-14: qty 40

## 2015-12-14 MED ORDER — ALPRAZOLAM 0.5 MG PO TABS: 1.0000 mg | ORAL_TABLET | Freq: Two times a day (BID) | ORAL | Status: DC

## 2015-12-14 MED ORDER — MENTHOL 3 MG MT LOZG: 1.0000 | LOZENGE | OROMUCOSAL | Status: DC | PRN

## 2015-12-14 MED ORDER — LACTATED RINGERS IV SOLN
INTRAVENOUS | Status: DC
  Administered 2015-12-14: 11:00:00 via INTRAVENOUS
  Administered 2015-12-14: 50 mL/h via INTRAVENOUS
  Administered 2015-12-14: 13:00:00 via INTRAVENOUS

## 2015-12-14 MED ORDER — ACETAMINOPHEN 650 MG RE SUPP: 650.0000 mg | RECTAL | Status: DC | PRN

## 2015-12-14 MED ORDER — OXYCODONE-ACETAMINOPHEN 5-325 MG PO TABS
1.0000 | ORAL_TABLET | ORAL | Status: DC | PRN
  Administered 2015-12-14: 2 via ORAL
  Filled 2015-12-14: qty 2

## 2015-12-14 MED ORDER — LIDOCAINE 2% (20 MG/ML) 5 ML SYRINGE
INTRAMUSCULAR | Status: AC
  Filled 2015-12-14: qty 5

## 2015-12-14 MED ORDER — ACETAMINOPHEN 325 MG PO TABS: 650.0000 mg | ORAL_TABLET | ORAL | Status: DC | PRN

## 2015-12-14 MED ORDER — CEFAZOLIN SODIUM 1-5 GM-% IV SOLN: 1.0000 g | Freq: Three times a day (TID) | INTRAVENOUS | Status: DC

## 2015-12-14 MED ORDER — MUPIROCIN 2 % EX OINT
1.0000 "application " | TOPICAL_OINTMENT | Freq: Once | CUTANEOUS | Status: AC
  Administered 2015-12-14: 1 via TOPICAL

## 2015-12-14 MED ORDER — SODIUM CHLORIDE 0.9% FLUSH: 3.0000 mL | INTRAVENOUS | Status: DC | PRN

## 2015-12-14 MED ORDER — HYDROCODONE-ACETAMINOPHEN 5-325 MG PO TABS: 1.0000 | ORAL_TABLET | ORAL | Status: DC | PRN

## 2015-12-14 MED ORDER — SUGAMMADEX SODIUM 200 MG/2ML IV SOLN
INTRAVENOUS | Status: DC | PRN
  Administered 2015-12-14: 200 mg via INTRAVENOUS

## 2015-12-14 MED ORDER — THROMBIN 5000 UNITS EX SOLR
CUTANEOUS | Status: DC | PRN
  Administered 2015-12-14 (×2): 5000 [IU] via TOPICAL

## 2015-12-14 MED ORDER — METFORMIN HCL 500 MG PO TABS: 500.0000 mg | ORAL_TABLET | Freq: Two times a day (BID) | ORAL | Status: DC

## 2015-12-14 MED ORDER — ONDANSETRON HCL 4 MG/2ML IJ SOLN: 4.0000 mg | Freq: Once | INTRAMUSCULAR | Status: DC | PRN

## 2015-12-14 MED ORDER — ROCURONIUM BROMIDE 100 MG/10ML IV SOLN
INTRAVENOUS | Status: DC | PRN
  Administered 2015-12-14: 50 mg via INTRAVENOUS

## 2015-12-14 MED ORDER — ALBUTEROL SULFATE HFA 108 (90 BASE) MCG/ACT IN AERS
INHALATION_SPRAY | RESPIRATORY_TRACT | Status: DC | PRN
  Administered 2015-12-14: 5 via RESPIRATORY_TRACT

## 2015-12-14 MED ORDER — MIDAZOLAM HCL 5 MG/5ML IJ SOLN
INTRAMUSCULAR | Status: DC | PRN
  Administered 2015-12-14: 2 mg via INTRAVENOUS

## 2015-12-14 MED ORDER — 0.9 % SODIUM CHLORIDE (POUR BTL) OPTIME
TOPICAL | Status: DC | PRN
  Administered 2015-12-14: 1000 mL

## 2015-12-14 MED ORDER — ALBUTEROL SULFATE HFA 108 (90 BASE) MCG/ACT IN AERS
INHALATION_SPRAY | RESPIRATORY_TRACT | Status: AC
  Filled 2015-12-14: qty 6.7

## 2015-12-14 MED ORDER — SODIUM CHLORIDE 0.9 % IR SOLN
Status: DC | PRN
  Administered 2015-12-14: 11:00:00

## 2015-12-14 MED ORDER — OXYCODONE HCL 5 MG PO TABS: 10.0000 mg | ORAL_TABLET | Freq: Once | ORAL | Status: DC | PRN

## 2015-12-14 MED ORDER — ONDANSETRON HCL 4 MG/2ML IJ SOLN: 4.0000 mg | INTRAMUSCULAR | Status: DC | PRN

## 2015-12-14 MED ORDER — DIVALPROEX SODIUM 500 MG PO DR TAB
1500.0000 mg | DELAYED_RELEASE_TABLET | Freq: Two times a day (BID) | ORAL | Status: DC
  Filled 2015-12-14: qty 3

## 2015-12-14 MED ORDER — LEVETIRACETAM 500 MG PO TABS
1000.0000 mg | ORAL_TABLET | Freq: Two times a day (BID) | ORAL | Status: DC
  Filled 2015-12-14: qty 2

## 2015-12-14 MED ORDER — DEXTROSE 50 % IV SOLN
25.0000 mL | Freq: Once | INTRAVENOUS | Status: AC
  Administered 2015-12-14: 25 mL via INTRAVENOUS
  Filled 2015-12-14: qty 50

## 2015-12-14 SURGICAL SUPPLY — 53 items
APL SKNCLS STERI-STRIP NONHPOA (GAUZE/BANDAGES/DRESSINGS) ×1
BAG DECANTER FOR FLEXI CONT (MISCELLANEOUS) ×2 IMPLANT
BENZOIN TINCTURE PRP APPL 2/3 (GAUZE/BANDAGES/DRESSINGS) ×2 IMPLANT
BIT DRILL 13 (BIT) ×1 IMPLANT
BRUSH SCRUB EZ PLAIN DRY (MISCELLANEOUS) ×2 IMPLANT
BUR MATCHSTICK NEURO 3.0 LAGG (BURR) ×2 IMPLANT
CAGE PEEK 6X14X11 (Cage) ×2 IMPLANT
CANISTER SUCT 3000ML PPV (MISCELLANEOUS) ×2 IMPLANT
DRAPE C-ARM 42X72 X-RAY (DRAPES) ×4 IMPLANT
DRAPE LAPAROTOMY 100X72 PEDS (DRAPES) ×2 IMPLANT
DRAPE MICROSCOPE LEICA (MISCELLANEOUS) ×2 IMPLANT
DRAPE POUCH INSTRU U-SHP 10X18 (DRAPES) ×2 IMPLANT
DRSG OPSITE POSTOP 3X4 (GAUZE/BANDAGES/DRESSINGS) ×1 IMPLANT
DURAPREP 6ML APPLICATOR 50/CS (WOUND CARE) ×2 IMPLANT
ELECT COATED BLADE 2.86 ST (ELECTRODE) ×2 IMPLANT
ELECT REM PT RETURN 9FT ADLT (ELECTROSURGICAL) ×2
ELECTRODE REM PT RTRN 9FT ADLT (ELECTROSURGICAL) ×1 IMPLANT
GAUZE SPONGE 4X4 12PLY STRL (GAUZE/BANDAGES/DRESSINGS) ×2 IMPLANT
GAUZE SPONGE 4X4 16PLY XRAY LF (GAUZE/BANDAGES/DRESSINGS) IMPLANT
GLOVE ECLIPSE 9.0 STRL (GLOVE) ×2 IMPLANT
GLOVE EXAM NITRILE LRG STRL (GLOVE) IMPLANT
GLOVE EXAM NITRILE MD LF STRL (GLOVE) IMPLANT
GLOVE EXAM NITRILE XL STR (GLOVE) IMPLANT
GLOVE EXAM NITRILE XS STR PU (GLOVE) IMPLANT
GOWN STRL REUS W/ TWL LRG LVL3 (GOWN DISPOSABLE) IMPLANT
GOWN STRL REUS W/ TWL XL LVL3 (GOWN DISPOSABLE) IMPLANT
GOWN STRL REUS W/TWL 2XL LVL3 (GOWN DISPOSABLE) IMPLANT
GOWN STRL REUS W/TWL LRG LVL3 (GOWN DISPOSABLE)
GOWN STRL REUS W/TWL XL LVL3 (GOWN DISPOSABLE)
HALTER HD/CHIN CERV TRACTION D (MISCELLANEOUS) ×2 IMPLANT
KIT BASIN OR (CUSTOM PROCEDURE TRAY) ×2 IMPLANT
KIT ROOM TURNOVER OR (KITS) ×2 IMPLANT
NDL SPNL 20GX3.5 QUINCKE YW (NEEDLE) ×1 IMPLANT
NEEDLE SPNL 20GX3.5 QUINCKE YW (NEEDLE) ×2 IMPLANT
NS IRRIG 1000ML POUR BTL (IV SOLUTION) ×2 IMPLANT
PACK LAMINECTOMY NEURO (CUSTOM PROCEDURE TRAY) ×2 IMPLANT
PAD ARMBOARD 7.5X6 YLW CONV (MISCELLANEOUS) ×6 IMPLANT
PLATE 23MM (Plate) ×1 IMPLANT
RUBBERBAND STERILE (MISCELLANEOUS) ×4 IMPLANT
SCREW ST 13X4XST VA NS SPNE (Screw) IMPLANT
SCREW ST VAR 4 ATL (Screw) ×8 IMPLANT
SPACER SPNL 11X14X6XPEEK CVD (Cage) IMPLANT
SPCR SPNL 11X14X6XPEEK CVD (Cage) ×1 IMPLANT
SPONGE INTESTINAL PEANUT (DISPOSABLE) ×2 IMPLANT
SPONGE SURGIFOAM ABS GEL SZ50 (HEMOSTASIS) ×2 IMPLANT
STRIP CLOSURE SKIN 1/2X4 (GAUZE/BANDAGES/DRESSINGS) ×2 IMPLANT
SUT VIC AB 3-0 SH 8-18 (SUTURE) ×2 IMPLANT
SUT VIC AB 4-0 RB1 18 (SUTURE) ×2 IMPLANT
TAPE CLOTH 4X10 WHT NS (GAUZE/BANDAGES/DRESSINGS) ×2 IMPLANT
TOWEL OR 17X24 6PK STRL BLUE (TOWEL DISPOSABLE) ×2 IMPLANT
TOWEL OR 17X26 10 PK STRL BLUE (TOWEL DISPOSABLE) ×2 IMPLANT
TRAP SPECIMEN MUCOUS 40CC (MISCELLANEOUS) ×2 IMPLANT
WATER STERILE IRR 1000ML POUR (IV SOLUTION) ×2 IMPLANT

## 2015-12-14 NOTE — Anesthesia Procedure Notes (Signed)
Procedure Name: Intubation Performed by: Judeth Cornfield T Pre-anesthesia Checklist: Patient identified, Timeout performed, Emergency Drugs available, Suction available and Patient being monitored Patient Re-evaluated:Patient Re-evaluated prior to inductionOxygen Delivery Method: Circle system utilized Preoxygenation: Pre-oxygenation with 100% oxygen Intubation Type: IV induction Ventilation: Mask ventilation without difficulty and Oral airway inserted - appropriate to patient size Laryngoscope Size: Mac, 3 and Glidescope Grade View: Grade I Tube type: Oral Tube size: 7.5 mm Number of attempts: 1 Airway Equipment and Method: Rigid stylet Placement Confirmation: ETT inserted through vocal cords under direct vision,  breath sounds checked- equal and bilateral,  positive ETCO2 and CO2 detector Secured at: 24 cm Tube secured with: Tape Dental Injury: Teeth and Oropharynx as per pre-operative assessment  Difficulty Due To: Difficult Airway-  due to neck instability and Difficulty was anticipated Comments: Head remains in midline and neutral position for induction and intubation.

## 2015-12-14 NOTE — Discharge Instructions (Signed)

## 2015-12-14 NOTE — Anesthesia Preprocedure Evaluation (Addendum)
Anesthesia Evaluation  Patient identified by MRN, date of birth, ID band Patient awake    Reviewed: Allergy & Precautions, H&P , NPO status , Patient's Chart, lab work & pertinent test results  History of Anesthesia Complications Negative for: history of anesthetic complications  Airway Mallampati: II  TM Distance: >3 FB Neck ROM: full    Dental no notable dental hx.    Pulmonary sleep apnea and Continuous Positive Airway Pressure Ventilation , Current Smoker,    Pulmonary exam normal breath sounds clear to auscultation       Cardiovascular hypertension, Pt. on medications Normal cardiovascular exam Rhythm:regular Rate:Normal     Neuro/Psych  Headaches, Seizures -,  Anxiety Depression Bipolar Disorder Schizophrenia    GI/Hepatic negative GI ROS, Neg liver ROS,   Endo/Other  diabetes, Poorly Controlled, Type 2, Insulin Dependent  Renal/GU negative Renal ROS     Musculoskeletal   Abdominal   Peds  Hematology negative hematology ROS (+)   Anesthesia Other Findings Long history of medical noncompliance, continues to smoke, takes chronic opioids, seizure prophylaxis with keppra and depakote, insulin dependent diabetic, OSA but with CPAP use only sporadically  Denies acute cardiac or pulmonary symptoms  Reproductive/Obstetrics negative OB ROS                           Anesthesia Physical Anesthesia Plan  ASA: III  Anesthesia Plan: General   Post-op Pain Management:    Induction: Intravenous  Airway Management Planned: Oral ETT and Video Laryngoscope Planned  Additional Equipment:   Intra-op Plan:   Post-operative Plan: Extubation in OR  Informed Consent: I have reviewed the patients History and Physical, chart, labs and discussed the procedure including the risks, benefits and alternatives for the proposed anesthesia with the patient or authorized representative who has indicated  his/her understanding and acceptance.   Dental Advisory Given  Plan Discussed with: Anesthesiologist, CRNA and Surgeon  Anesthesia Plan Comments:         Anesthesia Quick Evaluation

## 2015-12-14 NOTE — Brief Op Note (Signed)
12/14/2015  12:47 PM  PATIENT:  Melvin Taylor  50 y.o. male  PRE-OPERATIVE DIAGNOSIS:  Stenosis  POST-OPERATIVE DIAGNOSIS:  Stenosis  PROCEDURE:  Procedure(s): ANTERIOR CERVICAL DECOMPRESSION/DISCECTOMY FUSION CERVICAL FIVE -SIX (N/A)  SURGEON:  Surgeon(s) and Role:    * Earnie Larsson, MD - Primary  PHYSICIAN ASSISTANT:   ASSISTANTS:    ANESTHESIA:   general  EBL:  Total I/O In: 1000 [I.V.:1000] Out: 100 [Blood:100]  BLOOD ADMINISTERED:none  DRAINS: none   LOCAL MEDICATIONS USED:  NONE  SPECIMEN:  No Specimen  DISPOSITION OF SPECIMEN:  N/A  COUNTS:  YES  TOURNIQUET:  * No tourniquets in log *  DICTATION: .Dragon Dictation  PLAN OF CARE: Admit to inpatient   PATIENT DISPOSITION:  PACU - hemodynamically stable.   Delay start of Pharmacological VTE agent (>24hrs) due to surgical blood loss or risk of bleeding: yes

## 2015-12-14 NOTE — Progress Notes (Signed)
Patient alert and oriented, mae's well, voiding adequate amount of urine, swallowing without difficulty, c/o mild pain. Patient discharged home with family. Script and discharged instructions given to patient. Patient and family stated understanding of d/c instructions given and has an appointment with MD.   

## 2015-12-14 NOTE — Op Note (Signed)
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   Surgeon:Franceska Strahm A.Ercil Cassis, M.D.  Asst. Surgeon: Arnoldo Morale  Anesthesia: General  Indication: 50 year old with severe neck pain and bilateral upper extremity symptoms consistent with C6 radiculopathies. Workup demonstrates evidence of marked spondylosis and stenosis at C5-6. Patient presents now for anterior cervical decompression infusion in hopes of improving his symptoms.  Operative note: After induction of anesthesia, patient position supine with neck slightly extended and held place of halter traction. Anterior cervical region prepped and draped sterilely. Incision made overlying C5-6. Dissection performed on the right. Retractor placed. Fluoroscopy used. Level confirmed. Disc space and size. Discectomy performed with various instruments down to level of the posterior annulus. Microscope brought field use throughout the remainder of discectomy. Remaining aspects of annulus and osteophytes removed using high-speed drill down to the level of the posterior longitudinal ligament. Posterior longitudinal limb is an elevated and resected piecemeal fashion. Underlying thecal sac identified. Wide central decompression was then performed by undercutting the bodies of C5 and C6. Decompression then proceeded H neural foramen. Wide anterior foraminotomies performed on course exiting C6 nerve roots bilaterally. At this point a very thorough decompression been achieved. There was no evidence of injury to thecal sac and nerve roots. Wound is then irrigated one final time. A 6 mm [employer] anatomic peek cage packed with local autograft was then impacted in place and recessed slightly from the anterior cortical margin. 23 mg  Atlantis anterior cervical plate was then placed over the C5 and C6 level. This an attachment or fluoroscopic guidance using 13 mm variable angle screw. All screws given a final tightening. Locking screws engaged. Final images revealed good position bone graft and hardware at the proper operative level with normal alignment of the spine. Wounds and irrigated one final time. Hemostasis was assured. Wounds and closed in a typical fashion. Steri-Strips and sterile dressing were applied. No apparent complications. Patient tolerated the procedure well and he returns to the recovery room postop.

## 2015-12-14 NOTE — Transfer of Care (Signed)
Immediate Anesthesia Transfer of Care Note  Patient: Melvin Taylor  Procedure(s) Performed: Procedure(s): ANTERIOR CERVICAL DECOMPRESSION/DISCECTOMY FUSION CERVICAL FIVE -SIX (N/A)  Patient Location: PACU  Anesthesia Type:General  Level of Consciousness: patient cooperative and responds to stimulation  Airway & Oxygen Therapy: Patient Spontanous Breathing and Patient connected to nasal cannula oxygen  Post-op Assessment: Report given to RN, Post -op Vital signs reviewed and stable and Patient moving all extremities X 4  Post vital signs: Reviewed and stable  Last Vitals:  Filed Vitals:   12/14/15 0907 12/14/15 1302  BP: 162/93   Pulse: 54 50  Temp: 36.7 C 36.3 C  Resp: 18 31    Last Pain: There were no vitals filed for this visit.       Complications: No apparent anesthesia complications

## 2015-12-14 NOTE — Anesthesia Postprocedure Evaluation (Signed)
Anesthesia Post Note  Patient: Melvin Taylor  Procedure(s) Performed: Procedure(s) (LRB): ANTERIOR CERVICAL DECOMPRESSION/DISCECTOMY FUSION CERVICAL FIVE -SIX (N/A)  Patient location during evaluation: PACU Anesthesia Type: General Level of consciousness: awake and alert Pain management: pain level controlled Vital Signs Assessment: post-procedure vital signs reviewed and stable Respiratory status: spontaneous breathing, nonlabored ventilation, respiratory function stable and patient connected to nasal cannula oxygen Cardiovascular status: blood pressure returned to baseline and stable Postop Assessment: no signs of nausea or vomiting Anesthetic complications: no    Last Vitals:  Filed Vitals:   12/14/15 1302 12/14/15 1305  BP:  162/92  Pulse: 50 60  Temp: 36.3 C   Resp: 31 48    Last Pain: There were no vitals filed for this visit.               Zenaida Deed

## 2015-12-14 NOTE — Progress Notes (Signed)
Notified Dr .Jillyn Hidden  Of patient's CBG 70, and that patient stated he ate a peppermint around 830 am because he felt like his blood sugar was low.  Dr. Jillyn Hidden ordered 1/2 amp D 50.

## 2015-12-14 NOTE — H&P (Signed)
Melvin Taylor is an 50 y.o. male.   Chief Complaint: Neck pain HPI: 50 year old male with progressive neck pain with bilateral C6 radicular pain failing conservative management. Workup demonstrates evidence of marked disc degeneration with associated spondylosis and stenosis at C5-6. Patient presents now for C5-6 anterior cervical discectomy and fusion in hopes of improving his symptoms.  Past Medical History  Diagnosis Date  . Anxiety   . Depression   . HTN (hypertension)   . Sleep apnea   . Bipolar 1 disorder ([place])   . Insomnia   . Headache(784.0)   . Schizophrenia ([place])   . Hallucinations     "long history of them"  . History of gout   . Neuropathy ([place])   . Chronic back pain   . Pain management   . Diabetes mellitus     Type II  . Shortness of breath dyspnea     with exertion  . Seizures ([place])     last sz between July 5-9th, 2016; epilepsy    Past Surgical History  Procedure Laterality Date  . Mandible fracture surgery    . Knee surgery Left     arthroscopy  . Tonsillectomy    . Colonoscopy with propofol N/A 05/07/2015    WCB:[id number] diverticulosis, multiple colon polyps removed, tubular adenoma, serrated colon polyp. Next colonoscopy October 2019  . Esophagogastroduodenoscopy (egd) with propofol N/A 05/07/2015    RMR: Status post dilation of normal esophagus. Gastritis.  . Esophageal dilation N/A 05/07/2015    Procedure: ESOPHAGEAL DILATION WITH 56FR MALONEY DILATOR;  Surgeon: [name], MD;  Location: [facility];  Service: Endoscopy;  Laterality: N/A;  . Biopsy  05/07/2015    Procedure: BIOPSY (Gastric);  Surgeon: [name], MD;  Location: [facility];  Service: Endoscopy;;  . Polypectomy  05/07/2015    Procedure: POLYPECTOMY (Hepatic Flexure, Distal Transverse Colon, Rectal);  Surgeon: [name], MD;  Location: [facility];  Service: Endoscopy;;    Family History  Problem Relation Age of Onset  . Arthritis    . Diabetes    . Asthma    . Diabetes Father    . Hypertension Father   . Stroke Sister    Social History:  reports that he has been smoking Cigarettes.  He has a 30 pack-year smoking history. He has never used smokeless tobacco. He reports that he drinks alcohol. He reports that he does not use illicit drugs.  Allergies:  Allergies  Allergen Reactions  . Aspirin Swelling    Medications Prior to Admission  Medication Sig Dispense Refill  . ALPRAZolam (XANAX) 1 MG tablet Take 1 mg by mouth 2 (two) times daily.    Marland Kitchen atenolol (TENORMIN) 100 MG tablet Take 100 mg by mouth daily.      . diazepam (VALIUM) 5 MG tablet Take 1 tablet (5 mg total) by mouth every 6 (six) hours as needed for anxiety. 16 tablet 0  . divalproex (DEPAKOTE) 500 MG DR tablet Take 3 tablets (1,500 mg total) by mouth 2 (two) times daily. 540 tablet 4  . glucose blood (ACCU-CHEK AVIVA) test strip Use to test 2 times a day 100 each 3  . Insulin Glargine (LANTUS SOLOSTAR) 100 UNIT/ML Solostar Pen Inject 18 Units into the skin at bedtime. 15 mL 2  . levETIRAcetam (KEPPRA) 500 MG tablet Take 2 tablets (1,000 mg total) by mouth 2 (two) times daily. 360 tablet 4  . metFORMIN (GLUCOPHAGE) 500 MG tablet Take 1 tablet (500 mg total) by  mouth 2 (two) times daily with a meal. 60 tablet 3  . methylPREDNISolone (MEDROL DOSEPAK) 4 MG TBPK tablet Take 4-24 mg by mouth See admin instructions. Day 1: 8 mg PO before breakfast, 4 mg after lunch and after dinner, and 8 mg at bedtime  Day 2: 4 mg PO before breakfast, after lunch, and after dinner and 8 mg at bedtime Day 3: 4 mg PO before breakfast, after lunch, after dinner, and at bedtime Day 4: 4 mg PO before breakfast, after lunch, and at bedtime  Day 5: 4 mg PO before breakfast and at bedtime Day 6: 4 mg PO before breakfast    . oxyCODONE-acetaminophen (PERCOCET/ROXICET) 5-325 MG tablet Take 1 tablet by mouth every 4 (four) hours as needed. (Patient taking differently: Take 1 tablet by mouth every 4 (four) hours as needed (For pain.). )  15 tablet 0  . methocarbamol (ROBAXIN) 500 MG tablet Take 1 tablet (500 mg total) by mouth 3 (three) times daily. (Patient taking differently: Take 500 mg by mouth every 8 (eight) hours as needed for muscle spasms. ) 21 tablet 0  . omeprazole (PRILOSEC) 20 MG capsule Take 1 capsule (20 mg total) by mouth daily. (Patient not taking: Reported on 12/11/2015) 90 capsule 3  . traMADol (ULTRAM) 50 MG tablet Take 1 tablet (50 mg total) by mouth every 6 (six) hours as needed. (Patient not taking: Reported on 12/11/2015) 15 tablet 0    Results for orders placed or performed during the hospital encounter of 12/14/15 (from the past 48 hour(s))  Basic metabolic panel     Status: Abnormal   Collection Time: 12/14/15  8:54 AM  Result Value Ref Range   Sodium 138 135 - 145 mmol/L   Potassium 3.8 3.5 - 5.1 mmol/L   Chloride 103 101 - 111 mmol/L   CO2 27 22 - 32 mmol/L   Glucose, Bld 62 (L) 65 - 99 mg/dL   BUN 15 6 - 20 mg/dL   Creatinine, Ser 0.98 0.61 - 1.24 mg/dL   Calcium 8.8 (L) 8.9 - 10.3 mg/dL   GFR calc non Af Amer >60 >60 mL/min   GFR calc Af Amer >60 >60 mL/min    Comment: (NOTE) The eGFR has been calculated using the CKD EPI equation. This calculation has not been validated in all clinical situations. eGFR's persistently <60 mL/min signify possible Chronic Kidney Disease.    Anion gap 8 5 - 15  CBC WITH DIFFERENTIAL     Status: Abnormal   Collection Time: 12/14/15  8:54 AM  Result Value Ref Range   WBC 7.1 4.0 - 10.5 K/uL   RBC 4.65 4.22 - 5.81 MIL/uL   Hemoglobin 14.5 13.0 - 17.0 g/dL   HCT 41.0 39.0 - 52.0 %   MCV 88.2 78.0 - 100.0 fL   MCH 31.2 26.0 - 34.0 pg   MCHC 35.4 30.0 - 36.0 g/dL   RDW 12.2 11.5 - 15.5 %   Platelets 194 150 - 400 K/uL   Neutrophils Relative % 31 %   Neutro Abs 2.2 1.7 - 7.7 K/uL   Lymphocytes Relative 57 %   Lymphs Abs 4.1 (H) 0.7 - 4.0 K/uL   Monocytes Relative 10 %   Monocytes Absolute 0.7 0.1 - 1.0 K/uL   Eosinophils Relative 2 %   Eosinophils  Absolute 0.1 0.0 - 0.7 K/uL   Basophils Relative 0 %   Basophils Absolute 0.0 0.0 - 0.1 K/uL  Glucose, capillary     Status: None  Collection Time: 12/14/15  9:03 AM  Result Value Ref Range   Glucose-Capillary 70 65 - 99 mg/dL  Glucose, capillary     Status: Abnormal   Collection Time: 12/14/15 10:06 AM  Result Value Ref Range   Glucose-Capillary 164 (H) 65 - 99 mg/dL   No results found.  Pertinent items noted in HPI and remainder of comprehensive ROS otherwise negative.  Blood pressure 162/93, pulse 54, temperature 98 F (36.7 C), temperature source Oral, resp. rate 18, height 5' 7" (1.702 m), weight 71.668 kg (158 lb), SpO2 100 %.  Patient is awake and alert. He is oriented and appropriate. He is obviously uncomfortable. Examination cervical spine reveals decreased range of motion. Spurling's maneuver is positive bilaterally. Motor examination with some mild weakness of wrist extension right greater than left otherwise motor strength intact. Sensory examination with decreased sensation to pinprick and light touch is C6 dermatomes bilaterally. Reflexes are normal active. No evidence of long track signs. Gait and posture normal. Examination head ears eyes nose and throat is unremarkable. Chest and abdomen are benign. Extremities are free from injury or deformity. Chest and abdomen are benign. Assessment/Plan C5-6 spondylosis with stenosis. Plan C5-6 anterior cervical discectomy with interbody fusion utilizing interbody peek cage, locally harvested autograft, and anterior plate instrumentation. Risks and benefits been explained. Patient wishes to proceed.  Melvin Taylor A 12/14/2015, 11:15 AM

## 2015-12-14 NOTE — Discharge Summary (Signed)
Physician Discharge Summary  Patient ID: Melvin Taylor MRN: [id number] DOB/AGE: 1966-05-14 50 y.o.  Admit date: 12/14/2015 Discharge date: 12/14/2015  Admission Diagnoses:  Discharge Diagnoses:  Active Problems:   Cervical spinal stenosis   Discharged Condition: good  Hospital Course: Patient admitted to the hospital where he underwent uncomplicated 0000000 anterior cervical discectomy and fusion. Postoperatively he is doing well. Neck and upper extremity symptoms improved. Swallowing well. And voiding without difficulty. Ready for discharge home.  Consults:   Significant Diagnostic Studies:   Treatments:   Discharge Exam: Blood pressure 155/83, pulse 53, temperature 97.7 F (36.5 C), temperature source Oral, resp. rate 16, height 5\' 7"  (1.702 m), weight 71.668 kg (158 lb), SpO2 100 %. Awake and alert. Oriented appropriate. Cranial nerve function intact. Motor and sensory function intact. Wound clean and dry. Chest and abdomen benign.  Disposition: 01-Home or Self Care     Medication List    STOP taking these medications        oxyCODONE-acetaminophen 5-325 MG tablet  Commonly known as:  PERCOCET/ROXICET      TAKE these medications        ALPRAZolam 1 MG tablet  Commonly known as:  XANAX  Take 1 mg by mouth 2 (two) times daily.     atenolol 100 MG tablet  Commonly known as:  TENORMIN  Take 100 mg by mouth daily.     cyclobenzaprine 10 MG tablet  Commonly known as:  FLEXERIL  Take 1 tablet (10 mg total) by mouth 3 (three) times daily as needed for muscle spasms.     divalproex 500 MG DR tablet  Commonly known as:  DEPAKOTE  Take 3 tablets (1,500 mg total) by mouth 2 (two) times daily.     glucose blood test strip  Commonly known as:  ACCU-CHEK AVIVA  Use to test 2 times a day     HYDROcodone-acetaminophen 10-325 MG tablet  Commonly known as:  NORCO  Take 1-2 tablets by mouth every 6 (six) hours as needed.     Insulin Glargine 100 UNIT/ML Solostar Pen   Commonly known as:  LANTUS SOLOSTAR  Inject 18 Units into the skin at bedtime.     levETIRAcetam 500 MG tablet  Commonly known as:  KEPPRA  Take 2 tablets (1,000 mg total) by mouth 2 (two) times daily.     metFORMIN 500 MG tablet  Commonly known as:  GLUCOPHAGE  Take 1 tablet (500 mg total) by mouth 2 (two) times daily with a meal.     methocarbamol 500 MG tablet  Commonly known as:  ROBAXIN  Take 1 tablet (500 mg total) by mouth 3 (three) times daily.     methylPREDNISolone 4 MG Tbpk tablet  Commonly known as:  MEDROL DOSEPAK  Take 4-24 mg by mouth See admin instructions. Day 1: 8 mg PO before breakfast, 4 mg after lunch and after dinner, and 8 mg at bedtime  Day 2: 4 mg PO before breakfast, after lunch, and after dinner and 8 mg at bedtime Day 3: 4 mg PO before breakfast, after lunch, after dinner, and at bedtime Day 4: 4 mg PO before breakfast, after lunch, and at bedtime  Day 5: 4 mg PO before breakfast and at bedtime Day 6: 4 mg PO before breakfast           Follow-up Information    Follow up with Charlie Pitter, MD.   Specialty:  Neurosurgery   Contact information:   [address] [facility] [address]  [address] [place] [address] [phone]       Signed: Charlie Pitter 12/14/2015, 5:20 PM

## 2015-12-15 ENCOUNTER — Encounter (HOSPITAL_COMMUNITY): Payer: Self-pay | Admitting: Neurosurgery

## 2015-12-23 ENCOUNTER — Telehealth: Payer: Self-pay

## 2015-12-23 NOTE — Telephone Encounter (Signed)
Pt called stating that his BG reading were very high. I asked him for 3-4 days of BG readings. He had a very hard time verbalizing this on the phone. He became very irritating when I tried to confirm BG numbers and times they were taking. He started yelling and screaming that his numbers are just over 200 and he needs help. I tried to explain to him that we need the exact  Readings. When i tried to confirm the readings he had gave me he yelled that he never gave me any of those. I asked the pt what he was taking and he told me Lantus, Novolog, and Metformin. I looked in Dr Emi Holes last note and it states that the Novolog was discontinued. When i asked him about this he said he really wasn't taking it but he needs it. At that point I told him he needed to be seen w/ his meter and logs at the visit due to inaccurate information over the phone.

## 2015-12-25 ENCOUNTER — Ambulatory Visit: Payer: Medicaid Other | Admitting: "Endocrinology

## 2015-12-29 ENCOUNTER — Ambulatory Visit: Payer: Medicaid Other | Admitting: "Endocrinology

## 2016-01-18 ENCOUNTER — Other Ambulatory Visit: Payer: Self-pay | Admitting: "Endocrinology

## 2016-01-18 LAB — HEMOGLOBIN A1C
Hgb A1c MFr Bld: 10.3 % — ABNORMAL HIGH (ref <5.7)
MEAN PLASMA GLUCOSE: 249 mg/dL

## 2016-01-18 LAB — BASIC METABOLIC PANEL
BUN: 12 mg/dL (ref 7–25)
CALCIUM: 9 mg/dL (ref 8.6–10.3)
CHLORIDE: 101 mmol/L (ref 98–110)
CO2: 31 mmol/L (ref 20–31)
CREATININE: 0.91 mg/dL (ref 0.70–1.33)
Glucose, Bld: 289 mg/dL — ABNORMAL HIGH (ref 65–99)
Potassium: 4.2 mmol/L (ref 3.5–5.3)
Sodium: 140 mmol/L (ref 135–146)

## 2016-01-25 ENCOUNTER — Encounter: Payer: Medicaid Other | Admitting: "Endocrinology

## 2016-01-25 NOTE — Progress Notes (Signed)
This encounter was created in error - please disregard.

## 2016-01-29 ENCOUNTER — Ambulatory Visit: Payer: Medicaid Other | Admitting: "Endocrinology

## 2016-02-17 ENCOUNTER — Ambulatory Visit: Payer: Medicaid Other | Admitting: "Endocrinology

## 2016-03-03 ENCOUNTER — Encounter: Payer: Self-pay | Admitting: "Endocrinology

## 2016-03-03 ENCOUNTER — Ambulatory Visit (INDEPENDENT_AMBULATORY_CARE_PROVIDER_SITE_OTHER): Payer: Medicaid Other | Admitting: "Endocrinology

## 2016-03-03 VITALS — BP 120/75 | HR 59

## 2016-03-03 DIAGNOSIS — E1159 Type 2 diabetes mellitus with other circulatory complications: Secondary | ICD-10-CM

## 2016-03-03 DIAGNOSIS — I10 Essential (primary) hypertension: Secondary | ICD-10-CM

## 2016-03-03 DIAGNOSIS — Z9119 Patient's noncompliance with other medical treatment and regimen: Secondary | ICD-10-CM

## 2016-03-03 DIAGNOSIS — I25119 Atherosclerotic heart disease of native coronary artery with unspecified angina pectoris: Secondary | ICD-10-CM | POA: Diagnosis not present

## 2016-03-03 DIAGNOSIS — I2510 Atherosclerotic heart disease of native coronary artery without angina pectoris: Secondary | ICD-10-CM | POA: Insufficient documentation

## 2016-03-03 DIAGNOSIS — Z91199 Patient's noncompliance with other medical treatment and regimen due to unspecified reason: Secondary | ICD-10-CM

## 2016-03-03 NOTE — Progress Notes (Signed)
Subjective:    Patient ID: Melvin Taylor, male    DOB: July 14, 1966, PCP Rosita Fire, MD   Past Medical History:  Diagnosis Date  . Anxiety   . Bipolar 1 disorder ([place])   . Chronic back pain   . Depression   . Diabetes mellitus    Type II  . Hallucinations    "long history of them"  . Headache(784.0)   . History of gout   . HTN (hypertension)   . Insomnia   . Neuropathy ([place])   . Pain management   . Schizophrenia ([place])   . Seizures ([place])    last sz between July 5-9th, 2016; epilepsy  . Shortness of breath dyspnea    with exertion  . Sleep apnea    Past Surgical History:  Procedure Laterality Date  . ANTERIOR CERVICAL DECOMP/DISCECTOMY FUSION N/A 12/14/2015   Procedure: ANTERIOR CERVICAL DECOMPRESSION/DISCECTOMY FUSION CERVICAL FIVE -SIX;  Surgeon: Earnie Larsson, MD;  Location: [place] [facility];  Service: Neurosurgery;  Laterality: N/A;  . BIOPSY  05/07/2015   Procedure: BIOPSY (Gastric);  Surgeon: [name], MD;  Location: [facility];  Service: Endoscopy;;  . COLONOSCOPY WITH PROPOFOL N/A 05/07/2015   [id number] diverticulosis, multiple colon polyps removed, tubular adenoma, serrated colon polyp. Next colonoscopy October 2019  . ESOPHAGEAL DILATION N/A 05/07/2015   Procedure: ESOPHAGEAL DILATION WITH 56FR MALONEY DILATOR;  Surgeon: [name], MD;  Location: [facility];  Service: Endoscopy;  Laterality: N/A;  . ESOPHAGOGASTRODUODENOSCOPY (EGD) WITH PROPOFOL N/A 05/07/2015   RMR: Status post dilation of normal esophagus. Gastritis.  Marland Kitchen KNEE SURGERY Left    arthroscopy  . MANDIBLE FRACTURE SURGERY    . POLYPECTOMY  05/07/2015   Procedure: POLYPECTOMY (Hepatic Flexure, Distal Transverse Colon, Rectal);  Surgeon: [name], MD;  Location: [facility];  Service: Endoscopy;;  . TONSILLECTOMY     Social History   Social History  . Marital status: Single    Spouse name: N/A  . Number of children: N/A  . Years of education: 11th grade   Occupational History  . unemployed  Not Employed   Social History Main Topics  . Smoking status: Current Every Day Smoker    Packs/day: 1.00    Years: 30.00    Types: Cigarettes  . Smokeless tobacco: Never Used     Comment: 09/21/15 trying to quit, < 1 PPD  . Alcohol use Yes     Comment: 02/11/15 "drink a beer every now and then"  . Drug use: No     Comment: hx of cocaine use but denies using in over a year, denies marjuana use 01/18/15 and 02/11/15  . Sexual activity: Not Currently   Other Topics Concern  . None   Social History Narrative   Lives alone   Drinks a cup of coffee a day   Outpatient Encounter Prescriptions as of 03/03/2016  Medication Sig  . ALPRAZolam (XANAX) 1 MG tablet Take 1 mg by mouth 2 (two) times daily.  Marland Kitchen atenolol (TENORMIN) 100 MG tablet Take 100 mg by mouth daily.    . clopidogrel (PLAVIX) 75 MG tablet Take 75 mg by mouth daily.  . cyclobenzaprine (FLEXERIL) 10 MG tablet Take 1 tablet (10 mg total) by mouth 3 (three) times daily as needed for muscle spasms.  . divalproex (DEPAKOTE) 500 MG DR tablet Take 3 tablets (1,500 mg total) by mouth 2 (two) times daily.  Marland Kitchen glucose blood (ACCU-CHEK AVIVA) test strip Use to test 2 times a day  .  HYDROcodone-acetaminophen (NORCO) 7.5-325 MG tablet Take 1 tablet by mouth every 6 (six) hours as needed for moderate pain.  . Insulin Glargine (LANTUS SOLOSTAR ) Inject 24 Units into the skin at bedtime.  . levETIRAcetam (KEPPRA) 500 MG tablet Take 2 tablets (1,000 mg total) by mouth 2 (two) times daily.  Marland Kitchen lovastatin (MEVACOR) 20 MG tablet Take 20 mg by mouth at bedtime.  . methocarbamol (ROBAXIN) 500 MG tablet Take 1 tablet (500 mg total) by mouth 3 (three) times daily. (Patient taking differently: Take 500 mg by mouth every 8 (eight) hours as needed for muscle spasms. )  . methylPREDNISolone (MEDROL DOSEPAK) 4 MG TBPK tablet Take 4-24 mg by mouth See admin instructions. Day 1: 8 mg PO before breakfast, 4 mg after lunch and after dinner, and 8 mg at bedtime  Day  2: 4 mg PO before breakfast, after lunch, and after dinner and 8 mg at bedtime Day 3: 4 mg PO before breakfast, after lunch, after dinner, and at bedtime Day 4: 4 mg PO before breakfast, after lunch, and at bedtime  Day 5: 4 mg PO before breakfast and at bedtime Day 6: 4 mg PO before breakfast  . nitroGLYCERIN (NITROSTAT) 0.4 MG SL tablet Place 0.4 mg under the tongue every 5 (five) minutes as needed for chest pain.  . [DISCONTINUED] Insulin Glargine (LANTUS SOLOSTAR) 100 UNIT/ML Solostar Pen Inject 18 Units into the skin at bedtime.  . [DISCONTINUED] metFORMIN (GLUCOPHAGE) 500 MG tablet Take 1 tablet (500 mg total) by mouth 2 (two) times daily with a meal.  . [DISCONTINUED] HYDROcodone-acetaminophen (NORCO) 10-325 MG tablet Take 1-2 tablets by mouth every 6 (six) hours as needed.   No facility-administered encounter medications on file as of 03/03/2016.    ALLERGIES: Allergies  Allergen Reactions  . Aspirin Swelling   VACCINATION STATUS: Immunization History  Administered Date(s) Administered  . Influenza,trivalent, recombinat, inj, PF 05/02/2015  . Tdap 01/18/2015    Diabetes  He presents for his follow-up diabetic visit. He has type 2 diabetes mellitus. Onset time: He was diagnosed the approximate age of 65 years. His disease course has been worsening. There are no hypoglycemic associated symptoms. Pertinent negatives for hypoglycemia include no confusion, headaches, pallor or seizures. Associated symptoms include fatigue, polydipsia and polyuria. Pertinent negatives for diabetes include no chest pain, no polyphagia and no weakness. There are no hypoglycemic complications. Symptoms are worsening. Diabetic complications include heart disease. (He is seriously noncompliant patient. He is now diagnosed with coronary artery disease at [facility] since last visit. He is awaiting for his outpatient cardiology evaluation. This assay to be set for 03/24/2016.) Risk factors for coronary  artery disease include diabetes mellitus, dyslipidemia, hypertension, male sex, tobacco exposure and sedentary lifestyle. Current diabetic treatment includes insulin injections. He is compliant with treatment none of the time. His weight is stable. He is following a generally unhealthy diet. When asked about meal planning, he reported none. He has had a previous visit with a dietitian. He never participates in exercise. Home blood sugar record trend: He didn't bring his meter, his logs show rare random monitoring averaging greater than 200 mg/dL. His overall blood glucose range is >200 mg/dl.  Hyperlipidemia  This is a chronic problem. The current episode started more than 1 year ago. Exacerbating diseases include diabetes. Pertinent negatives include no chest pain, myalgias or shortness of breath. Risk factors for coronary artery disease include diabetes mellitus, dyslipidemia, hypertension, a sedentary lifestyle and male sex.  Hypertension  Pertinent negatives  include no chest pain, headaches, neck pain, palpitations or shortness of breath.     Review of Systems  Constitutional: Positive for fatigue. Negative for unexpected weight change.  HENT: Negative for dental problem, mouth sores and trouble swallowing.   Eyes: Negative for visual disturbance.  Respiratory: Negative for cough, choking, chest tightness, shortness of breath and wheezing.   Cardiovascular: Negative for chest pain, palpitations and leg swelling.  Gastrointestinal: Negative for abdominal distention, abdominal pain, constipation, diarrhea, nausea and vomiting.  Endocrine: Positive for polydipsia and polyuria. Negative for polyphagia.  Genitourinary: Negative for dysuria, flank pain, hematuria and urgency.  Musculoskeletal: Negative for back pain, gait problem, myalgias and neck pain.  Skin: Negative for pallor, rash and wound.  Neurological: Negative for seizures, syncope, weakness, numbness and headaches.   Psychiatric/Behavioral: Negative.  Negative for confusion and dysphoric mood.    Objective:    BP 120/75   Pulse (!) 59   Wt Readings from Last 3 Encounters:  12/14/15 158 lb (71.7 kg)  11/20/15 156 lb (70.8 kg)  11/09/15 160 lb (72.6 kg)    Physical Exam  Constitutional: He is oriented to person, place, and time. He appears well-developed and well-nourished. He is cooperative. No distress.  HENT:  Head: Normocephalic and atraumatic.  Eyes: EOM are normal.  Neck: Normal range of motion. Neck supple. No tracheal deviation present. No thyromegaly present.  Cardiovascular: Normal rate, S1 normal, S2 normal and normal heart sounds.  Exam reveals no gallop.   No murmur heard. Pulses:      Dorsalis pedis pulses are 1+ on the right side, and 1+ on the left side.       Posterior tibial pulses are 1+ on the right side, and 1+ on the left side.  Pulmonary/Chest: Breath sounds normal. No respiratory distress. He has no wheezes.  Abdominal: Soft. Bowel sounds are normal. He exhibits no distension. There is no tenderness. There is no guarding and no CVA tenderness.  Musculoskeletal: He exhibits no edema.       Right shoulder: He exhibits no swelling and no deformity.  Neurological: He is alert and oriented to person, place, and time. He has normal strength and normal reflexes. No cranial nerve deficit or sensory deficit. Gait normal.  Skin: Skin is warm and dry. No rash noted. No cyanosis. Nails show no clubbing.  Psychiatric: His speech is normal. Cognition and memory are normal.  Noncompliant behavior.   Chemistry (most recent): Lab Results  Component Value Date   NA 140 01/18/2016   K 4.2 01/18/2016   CL 101 01/18/2016   CO2 31 01/18/2016   BUN 12 01/18/2016   CREATININE 0.91 01/18/2016   Diabetic Labs (most recent): Lab Results  Component Value Date   HGBA1C 10.3 (H) 01/18/2016   HGBA1C 8.3 (H) 10/19/2015   HGBA1C 9.8 07/03/2015   Lipid Panel     Component Value Date/Time    CHOL  10/26/2010 0458    162        ATP III CLASSIFICATION:  <200     mg/dL   Desirable  200-239  mg/dL   Borderline High  >=240    mg/dL   High          TRIG 172 (H) 10/26/2010 0458   HDL 47 10/26/2010 0458   CHOLHDL 3.4 10/26/2010 0458   VLDL 34 10/26/2010 0458   LDLCALC  10/26/2010 0458    81        Total Cholesterol/HDL:CHD Risk Coronary Heart Disease Risk  Table                     Men   Women  1/2 Average Risk   3.4   3.3  Average Risk       5.0   4.4  2 X Average Risk   9.6   7.1  3 X Average Risk  23.4   11.0        Use the calculated Patient Ratio above and the CHD Risk Table to determine the patient's CHD Risk.        ATP III CLASSIFICATION (LDL):  <100     mg/dL   Optimal  100-129  mg/dL   Near or Above                    Optimal  130-159  mg/dL   Borderline  160-189  mg/dL   High  >190     mg/dL   Very High    Assessment & Plan:   1. Uncontrolled type 2 diabetes mellitus with complication, with long-term current use of insulin ([place])  - diabetes is  complicated by Coronary artery disease since last visit, noncompliance and patient remains at a high risk for more acute and chronic complications of diabetes which include CAD, CVA, CKD, retinopathy, and neuropathy. These are all discussed in detail with the patient.  Patient came with out his meter, And logs showed rare random monitoring of blood glucose averaging more than 200 mg/dL. - his  recent A1c 8.3% improving from 15.2 %.  Glucose logs and insulin administration records pertaining to this visit,  to be scanned into patient's records.  Recent labs reviewed.   - I have re-counseled the patient on diet management   by adopting a carbohydrate restricted / protein rich  Diet.  - Suggestion is made for patient to avoid simple carbohydrates   from their diet including Cakes , Desserts, Ice Cream,  Soda (  diet and regular) , Sweet Tea , Candies,  Chips, Cookies, Artificial Sweeteners,   and "Sugar-free"  Products .  This will help patient to have stable blood glucose profile and potentially avoid unintended  Weight gain.  - Patient is advised to stick to a routine mealtimes to eat 3 meals  a day and avoid unnecessary snacks ( to snack only to correct hypoglycemia).  - The patient  has been  scheduled with Jearld Fenton, RDN, CDE for individualized DM education.  - I have approached patient with the following individualized plan to manage diabetes and patient reluctantly accepts .  - Based on his glucose profile and complication status, he would need basal/bolus insulin to treat his diabetes. However, unfortunately this patient has never shown adequate engagement for safe use of insulin.   I urged him to start monitoring blood glucose before meals and at bedtime for a week and return with his meter and logs to consider basal/ bolus insulin.   -  continue Lantus 18 units daily at bedtime associated with monitoring of BG AC  Breakfast and daily at bedtime  .  - In the meantime, I will increase his Lantus to 24 units daily at bedtime.  - I will discontinue his  metformin .  - He is not a good candidate for incretin therapy . - Patient specific target  for A1c; LDL, HDL, Triglycerides, and  Waist Circumference were discussed in detail.  2) BP/HTN: Uncontrolled. Continue current medications. 3)  Weight/Diet: CDE consult in progress,  exercise, and carbohydrates information provided. 4) medical noncompliance: I counseled him to refocus for better care of diabetes. 5) Chronic Care/Health Maintenance:  -Patient  Is on Statin medications and encouraged to continue to follow up with Ophthalmology, Podiatrist at least yearly or according to recommendations, and advised to quit smoking. I have recommended yearly flu vaccine and pneumonia vaccination at least every 5 years; moderate intensity exercise for up to 150 minutes weekly; and  sleep for at least 7 hours a day. - I have urged him to maintain his  scheduled outpatient cardiology evaluation given his recent diagnosis with coronary artery disease at [facility]. This outpatient cardiology evaluation is said to be scheduled for 03/24/2016. I advised him to go to emergency room if he develops chest pain in the interim.  - 25 minutes of time was spent on the care of this patient , 50% of which was applied for counseling on diabetes complications and their preventions.  - I advised patient to maintain close follow up with [facility], MD for primary care needs.  Patient is asked to bring meter and  blood glucose logs during their next visit.   Follow up plan: -Return in about 1 week (around 03/10/2016) for follow up with meter and logs- no labs.  Glade Lloyd, MD Phone: [phone]  Fax: [phone]   03/03/2016, 11:44 AM

## 2016-03-04 ENCOUNTER — Other Ambulatory Visit (HOSPITAL_COMMUNITY)
Admission: RE | Admit: 2016-03-04 | Discharge: 2016-03-04 | Disposition: A | Discharge status: Home / Self Care | Payer: Medicaid Other | Source: Ambulatory Visit | Attending: Cardiology | Admitting: Cardiology

## 2016-03-04 ENCOUNTER — Encounter: Payer: Self-pay | Admitting: Cardiovascular Disease

## 2016-03-04 ENCOUNTER — Encounter: Payer: Self-pay | Admitting: *Deleted

## 2016-03-04 ENCOUNTER — Ambulatory Visit (INDEPENDENT_AMBULATORY_CARE_PROVIDER_SITE_OTHER): Payer: Medicaid Other | Admitting: Cardiovascular Disease

## 2016-03-04 ENCOUNTER — Ambulatory Visit (HOSPITAL_COMMUNITY)
Admission: RE | Admit: 2016-03-04 | Discharge: 2016-03-04 | Disposition: A | Discharge status: Home / Self Care | Payer: Medicaid Other | Source: Ambulatory Visit | Attending: Cardiovascular Disease | Admitting: Cardiovascular Disease

## 2016-03-04 ENCOUNTER — Encounter (INDEPENDENT_AMBULATORY_CARE_PROVIDER_SITE_OTHER): Payer: Self-pay

## 2016-03-04 VITALS — BP 142/78 | HR 61 | Ht 67.0 in | Wt 147.0 lb

## 2016-03-04 DIAGNOSIS — I214 Non-ST elevation (NSTEMI) myocardial infarction: Secondary | ICD-10-CM | POA: Diagnosis not present

## 2016-03-04 DIAGNOSIS — Z87898 Personal history of other specified conditions: Secondary | ICD-10-CM

## 2016-03-04 DIAGNOSIS — I209 Angina pectoris, unspecified: Secondary | ICD-10-CM | POA: Diagnosis not present

## 2016-03-04 DIAGNOSIS — Z9289 Personal history of other medical treatment: Secondary | ICD-10-CM

## 2016-03-04 DIAGNOSIS — Z01818 Encounter for other preprocedural examination: Secondary | ICD-10-CM | POA: Insufficient documentation

## 2016-03-04 DIAGNOSIS — I10 Essential (primary) hypertension: Secondary | ICD-10-CM | POA: Diagnosis not present

## 2016-03-04 DIAGNOSIS — E785 Hyperlipidemia, unspecified: Secondary | ICD-10-CM

## 2016-03-04 LAB — CBC WITH DIFFERENTIAL/PLATELET
BASOS PCT: 0 %
Basophils Absolute: 0 10*3/uL (ref 0.0–0.1)
EOS ABS: 0.1 10*3/uL (ref 0.0–0.7)
Eosinophils Relative: 2 %
HEMATOCRIT: 37.3 % — AB (ref 39.0–52.0)
HEMOGLOBIN: 13.5 g/dL (ref 13.0–17.0)
LYMPHS ABS: 3.5 10*3/uL (ref 0.7–4.0)
Lymphocytes Relative: 59 %
MCH: 32.1 pg (ref 26.0–34.0)
MCHC: 36.2 g/dL — AB (ref 30.0–36.0)
MCV: 88.8 fL (ref 78.0–100.0)
MONOS PCT: 10 %
Monocytes Absolute: 0.6 10*3/uL (ref 0.1–1.0)
NEUTROS ABS: 1.8 10*3/uL (ref 1.7–7.7)
NEUTROS PCT: 29 %
Platelets: 200 10*3/uL (ref 150–400)
RBC: 4.2 MIL/uL — AB (ref 4.22–5.81)
RDW: 12.4 % (ref 11.5–15.5)
WBC: 6 10*3/uL (ref 4.0–10.5)

## 2016-03-04 LAB — COMPREHENSIVE METABOLIC PANEL
ALBUMIN: 3.7 g/dL (ref 3.5–5.0)
ALK PHOS: 41 U/L (ref 38–126)
ALT: 23 U/L (ref 17–63)
ANION GAP: 4 — AB (ref 5–15)
AST: 16 U/L (ref 15–41)
BILIRUBIN TOTAL: 0.5 mg/dL (ref 0.3–1.2)
BUN: 19 mg/dL (ref 6–20)
CALCIUM: 8.4 mg/dL — AB (ref 8.9–10.3)
CO2: 30 mmol/L (ref 22–32)
CREATININE: 0.98 mg/dL (ref 0.61–1.24)
Chloride: 103 mmol/L (ref 101–111)
GFR calc Af Amer: >60 mL/min (ref >60)
GFR calc non Af Amer: >60 mL/min (ref >60)
GLUCOSE: 135 mg/dL — AB (ref 65–99)
Potassium: 3.6 mmol/L (ref 3.5–5.1)
SODIUM: 137 mmol/L (ref 135–145)
TOTAL PROTEIN: 6.2 g/dL — AB (ref 6.5–8.1)

## 2016-03-04 LAB — PROTIME-INR
INR: 1.03
Prothrombin Time: 13.5 seconds (ref 11.4–15.2)

## 2016-03-04 MED ORDER — ATORVASTATIN CALCIUM 40 MG PO TABS: 40.0000 mg | ORAL_TABLET | Freq: Every day | ORAL | 0 refills | Status: DC

## 2016-03-04 NOTE — Patient Instructions (Signed)
Medication Instructions:  Stop lovastatin Start lipitor 40 mg   Labwork: Your physician recommends that you return for lab work in: today Cbc bmet Pt/inr   Testing/Procedures: A chest x-ray takes a picture of the organs and structures inside the chest, including the heart, lungs, and blood vessels. This test can show several things, including, whether the heart is enlarges; whether fluid is building up in the lungs; and whether pacemaker / defibrillator leads are still in place.   Your physician has requested that you have an echocardiogram. Echocardiography is a painless test that uses sound waves to create images of your heart. It provides your doctor with information about the size and shape of your heart and how well your heart's chambers and valves are working. This procedure takes approximately one hour. There are no restrictions for this procedure.  Your physician has requested that you have a cardiac catheterization. Cardiac catheterization is used to diagnose and/or treat various heart conditions. Doctors may recommend this procedure for a number of different reasons. The most common reason is to evaluate chest pain. Chest pain can be a symptom of coronary artery disease (CAD), and cardiac catheterization can show whether plaque is narrowing or blocking your heart's arteries. This procedure is also used to evaluate the valves, as well as measure the blood flow and oxygen levels in different parts of your heart. For further information please visit [email or web address]. Please follow instruction sheet, as given.     Follow-Up: Your physician recommends that you schedule a follow-up appointment in: to be determined based on tests   Any Other Special Instructions Will Be Listed Below (If Applicable).     If you need a refill on your cardiac medications before your next appointment, please call your pharmacy.

## 2016-03-04 NOTE — Progress Notes (Signed)
CARDIOLOGY CONSULT NOTE  Patient ID: Melvin Taylor MRN: [id number] DOB/AGE: June 18, 1966 49 y.o.  Admit date: (Not on file) Primary Physician: Rosita Fire, MD Referring Physician:   Reason for Consultation: chest pain  HPI: The patient is a 50 year old male with a history of hypertension, uncontrolled diabetes, cervical spinal stenosis, and hyperlipidemia referred for the evaluation of chest pain. ECG in March 2017 showed sinus rhythm with mild left axis deviation. HbA1c 10.3% 01/18/16.   He was reportedly hospitalized at [facility] for chest pain last week but I do not have any of these records. He said he had 2 episodes of chest pain. He had one which lasted approximately 2 hours and then subsided on its own. He had then been cleaning fish with his father and lifted up the bucket and experienced severe chest pain which he said "dropped me to the floor ". He had associated shortness of breath and he was in tears. He tells me his troponin was elevated at 0.22.  He has sleep apnea and uses CPAP. He has been smoking one pack of cigarettes daily for the past 5 or 6 years. He was recently started on Plavix for his presumed heart disease. He denies having undergone any noninvasive imaging while hospitalized. He denies ever experiencing chest pain or shortness of breath prior to this.  He used to walk to and from the store 2-3 miles daily but now can only get halfway to the store before having chest pain. He has used nitroglycerin since his visit to the hospital. He is also on atenolol.  Fam: Father had MI at 62.   Allergies  Allergen Reactions  . Aspirin Swelling    Current Outpatient Prescriptions  Medication Sig Dispense Refill  . ALPRAZolam (XANAX) 1 MG tablet Take 1 mg by mouth 2 (two) times daily.    Marland Kitchen atenolol (TENORMIN) 100 MG tablet Take 100 mg by mouth daily.      . clopidogrel (PLAVIX) 75 MG tablet Take 75 mg by mouth daily.    . cyclobenzaprine (FLEXERIL) 10 MG  tablet Take 1 tablet (10 mg total) by mouth 3 (three) times daily as needed for muscle spasms. 30 tablet 0  . divalproex (DEPAKOTE) 500 MG DR tablet Take 3 tablets (1,500 mg total) by mouth 2 (two) times daily. 540 tablet 4  . glucose blood (ACCU-CHEK AVIVA) test strip Use to test 2 times a day 100 each 3  . HYDROcodone-acetaminophen (NORCO) 7.5-325 MG tablet Take 1 tablet by mouth every 6 (six) hours as needed for moderate pain.    . Insulin Glargine (LANTUS SOLOSTAR [place]) Inject 24 Units into the skin at bedtime.    . levETIRAcetam (KEPPRA) 500 MG tablet Take 2 tablets (1,000 mg total) by mouth 2 (two) times daily. 360 tablet 4  . lovastatin (MEVACOR) 20 MG tablet Take 20 mg by mouth at bedtime.    . methocarbamol (ROBAXIN) 500 MG tablet Take 1 tablet (500 mg total) by mouth 3 (three) times daily. (Patient taking differently: Take 500 mg by mouth every 8 (eight) hours as needed for muscle spasms. ) 21 tablet 0  . methylPREDNISolone (MEDROL DOSEPAK) 4 MG TBPK tablet Take 4-24 mg by mouth See admin instructions. Day 1: 8 mg PO before breakfast, 4 mg after lunch and after dinner, and 8 mg at bedtime  Day 2: 4 mg PO before breakfast, after lunch, and after dinner and 8 mg at bedtime Day 3: 4 mg PO before breakfast, after lunch,  after dinner, and at bedtime Day 4: 4 mg PO before breakfast, after lunch, and at bedtime  Day 5: 4 mg PO before breakfast and at bedtime Day 6: 4 mg PO before breakfast    . nitroGLYCERIN (NITROSTAT) 0.4 MG SL tablet Place 0.4 mg under the tongue every 5 (five) minutes as needed for chest pain.     No current facility-administered medications for this visit.     Past Medical History:  Diagnosis Date  . Anxiety   . Bipolar 1 disorder ([place])   . Chronic back pain   . Depression   . Diabetes mellitus    Type II  . Hallucinations    "long history of them"  . Headache(784.0)   . History of gout   . HTN (hypertension)   . Insomnia   . Neuropathy ([place])   . Pain  management   . Schizophrenia ([place])   . Seizures ([place])    last sz between July 5-9th, 2016; epilepsy  . Shortness of breath dyspnea    with exertion  . Sleep apnea     Past Surgical History:  Procedure Laterality Date  . ANTERIOR CERVICAL DECOMP/DISCECTOMY FUSION N/A 12/14/2015   Procedure: ANTERIOR CERVICAL DECOMPRESSION/DISCECTOMY FUSION CERVICAL FIVE -SIX;  Surgeon: Earnie Larsson, MD;  Location: [facility];  Service: Neurosurgery;  Laterality: N/A;  . BIOPSY  05/07/2015   Procedure: BIOPSY (Gastric);  Surgeon: [name], MD;  Location: [facility];  Service: Endoscopy;;  . COLONOSCOPY WITH PROPOFOL N/A 05/07/2015   [id number] diverticulosis, multiple colon polyps removed, tubular adenoma, serrated colon polyp. Next colonoscopy October 2019  . ESOPHAGEAL DILATION N/A 05/07/2015   Procedure: ESOPHAGEAL DILATION WITH 56FR MALONEY DILATOR;  Surgeon: [name], MD;  Location: [facility];  Service: Endoscopy;  Laterality: N/A;  . ESOPHAGOGASTRODUODENOSCOPY (EGD) WITH PROPOFOL N/A 05/07/2015   RMR: Status post dilation of normal esophagus. Gastritis.  Marland Kitchen KNEE SURGERY Left    arthroscopy  . MANDIBLE FRACTURE SURGERY    . POLYPECTOMY  05/07/2015   Procedure: POLYPECTOMY (Hepatic Flexure, Distal Transverse Colon, Rectal);  Surgeon: [name], MD;  Location: [facility];  Service: Endoscopy;;  . TONSILLECTOMY      Social History   Social History  . Marital status: Single    Spouse name: N/A  . Number of children: N/A  . Years of education: 11th grade   Occupational History  . unemployed Not Employed   Social History Main Topics  . Smoking status: Current Every Day Smoker    Packs/day: 1.00    Years: 30.00    Types: Cigarettes  . Smokeless tobacco: Never Used     Comment: 09/21/15 trying to quit, < 1 PPD  . Alcohol use Yes     Comment: 02/11/15 "drink a beer every now and then"  . Drug use: No     Comment: hx of cocaine use but denies using in over a year, denies marjuana use  01/18/15 and 02/11/15  . Sexual activity: Not Currently   Other Topics Concern  . Not on file   Social History Narrative   Lives alone   Drinks a cup of coffee a day       Prior to Admission medications   Medication Sig Start Date End Date Taking? Authorizing Provider  ALPRAZolam Duanne Moron) 1 MG tablet Take 1 mg by mouth 2 (two) times daily.   Yes Historical Provider, MD  atenolol (TENORMIN) 100 MG tablet Take 100 mg by mouth daily.  Yes Historical Provider, MD  clopidogrel (PLAVIX) 75 MG tablet Take 75 mg by mouth daily.   Yes Historical Provider, MD  cyclobenzaprine (FLEXERIL) 10 MG tablet Take 1 tablet (10 mg total) by mouth 3 (three) times daily as needed for muscle spasms. 12/14/15  Yes Earnie Larsson, MD  divalproex (DEPAKOTE) 500 MG DR tablet Take 3 tablets (1,500 mg total) by mouth 2 (two) times daily. 09/21/15  Yes Penni Bombard, MD  glucose blood (ACCU-CHEK AVIVA) test strip Use to test 2 times a day 10/26/15  Yes Cassandria Anger, MD  HYDROcodone-acetaminophen (NORCO) 7.5-325 MG tablet Take 1 tablet by mouth every 6 (six) hours as needed for moderate pain.   Yes Historical Provider, MD  Insulin Glargine (LANTUS SOLOSTAR [place]) Inject 24 Units into the skin at bedtime.   Yes Historical Provider, MD  levETIRAcetam (KEPPRA) 500 MG tablet Take 2 tablets (1,000 mg total) by mouth 2 (two) times daily. 09/21/15  Yes Penni Bombard, MD  lovastatin (MEVACOR) 20 MG tablet Take 20 mg by mouth at bedtime.   Yes Historical Provider, MD  methocarbamol (ROBAXIN) 500 MG tablet Take 1 tablet (500 mg total) by mouth 3 (three) times daily. Patient taking differently: Take 500 mg by mouth every 8 (eight) hours as needed for muscle spasms.  11/20/15  Yes Hollace Kinnier Sofia, PA-C  methylPREDNISolone (MEDROL DOSEPAK) 4 MG TBPK tablet Take 4-24 mg by mouth See admin instructions. Day 1: 8 mg PO before breakfast, 4 mg after lunch and after dinner, and 8 mg at bedtime  Day 2: 4 mg PO before breakfast, after  lunch, and after dinner and 8 mg at bedtime Day 3: 4 mg PO before breakfast, after lunch, after dinner, and at bedtime Day 4: 4 mg PO before breakfast, after lunch, and at bedtime  Day 5: 4 mg PO before breakfast and at bedtime Day 6: 4 mg PO before breakfast   Yes Historical Provider, MD  nitroGLYCERIN (NITROSTAT) 0.4 MG SL tablet Place 0.4 mg under the tongue every 5 (five) minutes as needed for chest pain.   Yes Historical Provider, MD     Review of systems complete and found to be negative unless listed above in HPI     Physical exam Blood pressure (!) 142/78, pulse 61, height 5\' 7"  (1.702 m), weight 147 lb (66.7 kg), SpO2 96 %. General: NAD Neck: No JVD, no thyromegaly or thyroid nodule.  Lungs: Diminished throughout, no rales, mild rhonchi b/l. CV: Nondisplaced PMI. Regular rate and rhythm, normal S1/S2, no S3/S4, no murmur.  No peripheral edema.  No carotid bruit.    Abdomen: Soft, nontender, no distention.  Skin: Intact without lesions or rashes.  Neurologic: Alert and oriented x 3.  Psych: Normal affect. Extremities: No clubbing or cyanosis.  HEENT: Normal.   ECG: Most recent ECG reviewed.  Labs:   Lab Results  Component Value Date   WBC 7.1 12/14/2015   HGB 14.5 12/14/2015   HCT 41.0 12/14/2015   MCV 88.2 12/14/2015   PLT 194 12/14/2015   No results for input(s): NA, K, CL, CO2, BUN, CREATININE, CALCIUM, PROT, BILITOT, ALKPHOS, ALT, AST, GLUCOSE in the last 168 hours.  Invalid input(s): LABALBU Lab Results  Component Value Date   CKTOTAL 260 (H) 02/25/2010   CKMB 4.4 (H) 02/25/2010   TROPONINI <0.03 05/13/2015    Lab Results  Component Value Date   CHOL  10/26/2010    162        ATP III  CLASSIFICATION:  <200     mg/dL   Desirable  200-239  mg/dL   Borderline High  >=240    mg/dL   High          CHOL  02/25/2010    159        ATP III CLASSIFICATION:  <200     mg/dL   Desirable  200-239  mg/dL   Borderline High  >=240    mg/dL   High           CHOL  08/03/2009    186        ATP III CLASSIFICATION:  <200     mg/dL   Desirable  200-239  mg/dL   Borderline High  >=240    mg/dL   High          Lab Results  Component Value Date   HDL 47 10/26/2010   HDL 27 (L) 02/25/2010   HDL 53 08/03/2009   Lab Results  Component Value Date   LDLCALC  10/26/2010    81        Total Cholesterol/HDL:CHD Risk Coronary Heart Disease Risk Table                     Men   Women  1/2 Average Risk   3.4   3.3  Average Risk       5.0   4.4  2 X Average Risk   9.6   7.1  3 X Average Risk  23.4   11.0        Use the calculated Patient Ratio above and the CHD Risk Table to determine the patient's CHD Risk.        ATP III CLASSIFICATION (LDL):  <100     mg/dL   Optimal  100-129  mg/dL   Near or Above                    Optimal  130-159  mg/dL   Borderline  160-189  mg/dL   High  >190     mg/dL   Very High   LDLCALC  02/25/2010    87        Total Cholesterol/HDL:CHD Risk Coronary Heart Disease Risk Table                     Men   Women  1/2 Average Risk   3.4   3.3  Average Risk       5.0   4.4  2 X Average Risk   9.6   7.1  3 X Average Risk  23.4   11.0        Use the calculated Patient Ratio above and the CHD Risk Table to determine the patient's CHD Risk.        ATP III CLASSIFICATION (LDL):  <100     mg/dL   Optimal  100-129  mg/dL   Near or Above                    Optimal  130-159  mg/dL   Borderline  160-189  mg/dL   High  >190     mg/dL   Very High   LDLCALC (H) 08/03/2009    112        Total Cholesterol/HDL:CHD Risk Coronary Heart Disease Risk Table  Men   Women  1/2 Average Risk   3.4   3.3  Average Risk       5.0   4.4  2 X Average Risk   9.6   7.1  3 X Average Risk  23.4   11.0        Use the calculated Patient Ratio above and the CHD Risk Table to determine the patient's CHD Risk.        ATP III CLASSIFICATION (LDL):  <100     mg/dL   Optimal  100-129  mg/dL   Near or Above                     Optimal  130-159  mg/dL   Borderline  160-189  mg/dL   High  >190     mg/dL   Very High   Lab Results  Component Value Date   TRIG 172 (H) 10/26/2010   TRIG 223 (H) 02/25/2010   TRIG 107 08/03/2009   Lab Results  Component Value Date   CHOLHDL 3.4 10/26/2010   CHOLHDL 5.9 02/25/2010   CHOLHDL 3.5 08/03/2009   No results found for: LDLDIRECT       Studies: No results found.  ASSESSMENT AND PLAN:  1. Chest pain and possible NSTEMI: His symptoms are very worrisome for ischemic heart disease. I will obtain records from [facility]. I will proceed with left heart catheterization and coronary angiography. He is already taking Plavix and atenolol. I will switch lovastatin to Lipitor 40 mg daily. I will obtain an echocardiogram to evaluate cardiac structure and function.  2. Essential HTN: Mildly elevated. No changes for now.  3. Hyperlipidemia:  I will switch lovastatin to Lipitor 40 mg daily.  Dispo: fu after cath.   Signed: Kate Sable, M.D., F.A.C.C.  03/04/2016, 1:47 PM

## 2016-03-07 ENCOUNTER — Ambulatory Visit (HOSPITAL_COMMUNITY)
Admission: RE | Admit: 2016-03-07 | Discharge: 2016-03-07 | Disposition: A | Discharge status: Home / Self Care | Payer: Medicaid Other | Source: Ambulatory Visit | Attending: Interventional Cardiology | Admitting: Interventional Cardiology

## 2016-03-07 ENCOUNTER — Encounter (HOSPITAL_COMMUNITY): Payer: Self-pay | Admitting: Interventional Cardiology

## 2016-03-07 ENCOUNTER — Encounter (HOSPITAL_COMMUNITY)
Admission: RE | Disposition: A | Discharge status: Home / Self Care | Payer: Self-pay | Source: Ambulatory Visit | Attending: Interventional Cardiology

## 2016-03-07 DIAGNOSIS — Z86010 Personal history of colonic polyps: Secondary | ICD-10-CM | POA: Insufficient documentation

## 2016-03-07 DIAGNOSIS — I209 Angina pectoris, unspecified: Secondary | ICD-10-CM

## 2016-03-07 DIAGNOSIS — Z7902 Long term (current) use of antithrombotics/antiplatelets: Secondary | ICD-10-CM | POA: Diagnosis not present

## 2016-03-07 DIAGNOSIS — Z8249 Family history of ischemic heart disease and other diseases of the circulatory system: Secondary | ICD-10-CM | POA: Insufficient documentation

## 2016-03-07 DIAGNOSIS — M4802 Spinal stenosis, cervical region: Secondary | ICD-10-CM | POA: Diagnosis not present

## 2016-03-07 DIAGNOSIS — I2584 Coronary atherosclerosis due to calcified coronary lesion: Secondary | ICD-10-CM | POA: Diagnosis not present

## 2016-03-07 DIAGNOSIS — I2510 Atherosclerotic heart disease of native coronary artery without angina pectoris: Secondary | ICD-10-CM | POA: Diagnosis present

## 2016-03-07 DIAGNOSIS — F419 Anxiety disorder, unspecified: Secondary | ICD-10-CM | POA: Insufficient documentation

## 2016-03-07 DIAGNOSIS — G40909 Epilepsy, unspecified, not intractable, without status epilepticus: Secondary | ICD-10-CM | POA: Diagnosis not present

## 2016-03-07 DIAGNOSIS — F209 Schizophrenia, unspecified: Secondary | ICD-10-CM | POA: Insufficient documentation

## 2016-03-07 DIAGNOSIS — G8929 Other chronic pain: Secondary | ICD-10-CM | POA: Diagnosis not present

## 2016-03-07 DIAGNOSIS — M109 Gout, unspecified: Secondary | ICD-10-CM | POA: Diagnosis not present

## 2016-03-07 DIAGNOSIS — F319 Bipolar disorder, unspecified: Secondary | ICD-10-CM | POA: Insufficient documentation

## 2016-03-07 DIAGNOSIS — G4730 Sleep apnea, unspecified: Secondary | ICD-10-CM | POA: Diagnosis not present

## 2016-03-07 DIAGNOSIS — I2582 Chronic total occlusion of coronary artery: Secondary | ICD-10-CM | POA: Insufficient documentation

## 2016-03-07 DIAGNOSIS — I214 Non-ST elevation (NSTEMI) myocardial infarction: Secondary | ICD-10-CM

## 2016-03-07 DIAGNOSIS — G4700 Insomnia, unspecified: Secondary | ICD-10-CM | POA: Insufficient documentation

## 2016-03-07 DIAGNOSIS — Z794 Long term (current) use of insulin: Secondary | ICD-10-CM | POA: Diagnosis not present

## 2016-03-07 DIAGNOSIS — E1165 Type 2 diabetes mellitus with hyperglycemia: Secondary | ICD-10-CM | POA: Diagnosis not present

## 2016-03-07 DIAGNOSIS — E785 Hyperlipidemia, unspecified: Secondary | ICD-10-CM | POA: Insufficient documentation

## 2016-03-07 DIAGNOSIS — E1142 Type 2 diabetes mellitus with diabetic polyneuropathy: Secondary | ICD-10-CM | POA: Insufficient documentation

## 2016-03-07 DIAGNOSIS — F17210 Nicotine dependence, cigarettes, uncomplicated: Secondary | ICD-10-CM | POA: Diagnosis not present

## 2016-03-07 DIAGNOSIS — I25119 Atherosclerotic heart disease of native coronary artery with unspecified angina pectoris: Secondary | ICD-10-CM | POA: Insufficient documentation

## 2016-03-07 DIAGNOSIS — I10 Essential (primary) hypertension: Secondary | ICD-10-CM | POA: Diagnosis not present

## 2016-03-07 DIAGNOSIS — E1159 Type 2 diabetes mellitus with other circulatory complications: Secondary | ICD-10-CM | POA: Diagnosis present

## 2016-03-07 HISTORY — PX: CARDIAC CATHETERIZATION: SHX172

## 2016-03-07 LAB — GLUCOSE, CAPILLARY: Glucose-Capillary: 131 mg/dL — ABNORMAL HIGH (ref 65–99)

## 2016-03-07 SURGERY — LEFT HEART CATH AND CORONARY ANGIOGRAPHY
Anesthesia: LOCAL

## 2016-03-07 MED ORDER — ACETAMINOPHEN 325 MG PO TABS: 650.0000 mg | ORAL_TABLET | ORAL | Status: DC | PRN

## 2016-03-07 MED ORDER — SODIUM CHLORIDE 0.9 % WEIGHT BASED INFUSION
3.0000 mL/kg/h | INTRAVENOUS | Status: DC
  Administered 2016-03-07: 3 mL/kg/h via INTRAVENOUS

## 2016-03-07 MED ORDER — SODIUM CHLORIDE 0.9 % WEIGHT BASED INFUSION: 1.0000 mL/kg/h | INTRAVENOUS | Status: DC

## 2016-03-07 MED ORDER — FENTANYL CITRATE (PF) 100 MCG/2ML IJ SOLN
INTRAMUSCULAR | Status: DC | PRN
  Administered 2016-03-07: 25 ug via INTRAVENOUS
  Administered 2016-03-07: 50 ug via INTRAVENOUS

## 2016-03-07 MED ORDER — SODIUM CHLORIDE 0.9% FLUSH: 3.0000 mL | INTRAVENOUS | Status: DC | PRN

## 2016-03-07 MED ORDER — SODIUM CHLORIDE 0.9% FLUSH: 3.0000 mL | Freq: Two times a day (BID) | INTRAVENOUS | Status: DC

## 2016-03-07 MED ORDER — VERAPAMIL HCL 2.5 MG/ML IV SOLN
INTRAVENOUS | Status: AC
  Filled 2016-03-07: qty 2

## 2016-03-07 MED ORDER — HEPARIN (PORCINE) IN NACL 2-0.9 UNIT/ML-% IJ SOLN
INTRAMUSCULAR | Status: AC
  Filled 2016-03-07: qty 1500

## 2016-03-07 MED ORDER — HEPARIN SODIUM (PORCINE) 1000 UNIT/ML IJ SOLN
INTRAMUSCULAR | Status: AC
  Filled 2016-03-07: qty 1

## 2016-03-07 MED ORDER — LIDOCAINE HCL (PF) 1 % IJ SOLN
INTRAMUSCULAR | Status: AC
  Filled 2016-03-07: qty 30

## 2016-03-07 MED ORDER — MIDAZOLAM HCL 2 MG/2ML IJ SOLN
INTRAMUSCULAR | Status: DC | PRN
Start: 2016-03-07 — End: 2016-03-07
  Administered 2016-03-07 (×2): 1 mg via INTRAVENOUS

## 2016-03-07 MED ORDER — HEPARIN SODIUM (PORCINE) 1000 UNIT/ML IJ SOLN
INTRAMUSCULAR | Status: DC | PRN
  Administered 2016-03-07: 3500 [IU] via INTRAVENOUS

## 2016-03-07 MED ORDER — FENTANYL CITRATE (PF) 100 MCG/2ML IJ SOLN
INTRAMUSCULAR | Status: AC
  Filled 2016-03-07: qty 2

## 2016-03-07 MED ORDER — ASPIRIN 81 MG PO CHEW
CHEWABLE_TABLET | ORAL | Status: AC
  Filled 2016-03-07: qty 1

## 2016-03-07 MED ORDER — SODIUM CHLORIDE 0.9 % IV SOLN: 250.0000 mL | INTRAVENOUS | Status: DC | PRN

## 2016-03-07 MED ORDER — VERAPAMIL HCL 2.5 MG/ML IV SOLN
INTRAVENOUS | Status: DC | PRN
  Administered 2016-03-07: 10 mL via INTRA_ARTERIAL

## 2016-03-07 MED ORDER — ISOSORBIDE MONONITRATE ER 60 MG PO TB24: 60.0000 mg | ORAL_TABLET | Freq: Every day | ORAL | 5 refills | Status: DC

## 2016-03-07 MED ORDER — IOPAMIDOL (ISOVUE-370) INJECTION 76%
INTRAVENOUS | Status: AC
  Filled 2016-03-07: qty 100

## 2016-03-07 MED ORDER — IOPAMIDOL (ISOVUE-370) INJECTION 76%
INTRAVENOUS | Status: DC | PRN
  Administered 2016-03-07: 90 mL via INTRA_ARTERIAL

## 2016-03-07 MED ORDER — ONDANSETRON HCL 4 MG/2ML IJ SOLN: 4.0000 mg | Freq: Four times a day (QID) | INTRAMUSCULAR | Status: DC | PRN

## 2016-03-07 MED ORDER — MIDAZOLAM HCL 2 MG/2ML IJ SOLN
INTRAMUSCULAR | Status: AC
  Filled 2016-03-07: qty 2

## 2016-03-07 MED ORDER — HEPARIN (PORCINE) IN NACL 2-0.9 UNIT/ML-% IJ SOLN
INTRAMUSCULAR | Status: DC | PRN
  Administered 2016-03-07: 1500 mL

## 2016-03-07 MED ORDER — LIDOCAINE HCL (PF) 1 % IJ SOLN
INTRAMUSCULAR | Status: DC | PRN
  Administered 2016-03-07: 2 mL via INTRADERMAL

## 2016-03-07 SURGICAL SUPPLY — 9 items
CATH INFINITI 5 FR JL3.5 (CATHETERS) ×1 IMPLANT
CATH INFINITI JR4 5F (CATHETERS) ×1 IMPLANT
DEVICE RAD COMP TR BAND LRG (VASCULAR PRODUCTS) ×1 IMPLANT
GLIDESHEATH SLEND A-KIT 6F 22G (SHEATH) ×1 IMPLANT
KIT HEART LEFT (KITS) ×2 IMPLANT
PACK CARDIAC CATHETERIZATION (CUSTOM PROCEDURE TRAY) ×2 IMPLANT
TRANSDUCER W/STOPCOCK (MISCELLANEOUS) ×2 IMPLANT
TUBING CIL FLEX 10 FLL-RA (TUBING) ×2 IMPLANT
WIRE SAFE-T 1.5MM-J .035X260CM (WIRE) ×1 IMPLANT

## 2016-03-07 NOTE — Discharge Instructions (Signed)
Radial Site Care °Refer to this sheet in the next few weeks. These instructions provide you with information about caring for yourself after your procedure. Your health care provider may also give you more specific instructions. Your treatment has been planned according to current medical practices, but problems sometimes occur. Call your health care provider if you have any problems or questions after your procedure. °WHAT TO EXPECT AFTER THE PROCEDURE °After your procedure, it is typical to have the following: °· Bruising at the radial site that usually fades within 1-2 weeks. °· Blood collecting in the tissue (hematoma) that may be painful to the touch. It should usually decrease in size and tenderness within 1-2 weeks. °HOME CARE INSTRUCTIONS °· Take medicines only as directed by your health care provider. °· You may shower 24-48 hours after the procedure or as directed by your health care provider. Remove the bandage (dressing) and gently wash the site with plain soap and water. Pat the area dry with a clean towel. Do not rub the site, because this may cause bleeding. °· Do not take baths, swim, or use a hot tub until your health care provider approves. °· Check your insertion site every day for redness, swelling, or drainage. °· Do not apply powder or lotion to the site. °· Do not flex or bend the affected arm for 24 hours or as directed by your health care provider. °· Do not push or pull heavy objects with the affected arm for 24 hours or as directed by your health care provider. °· Do not lift over 10 lb (4.5 kg) for 5 days after your procedure or as directed by your health care provider. °· Ask your health care provider when it is okay to: °¨ Return to work or school. °¨ Resume usual physical activities or sports. °¨ Resume sexual activity. °· Do not drive home if you are discharged the same day as the procedure. Have someone else drive you. °· You may drive 24 hours after the procedure unless otherwise  instructed by your health care provider. °· Do not operate machinery or power tools for 24 hours after the procedure. °· If your procedure was done as an outpatient procedure, which means that you went home the same day as your procedure, a responsible adult should be with you for the first 24 hours after you arrive home. °· Keep all follow-up visits as directed by your health care provider. This is important. °SEEK MEDICAL CARE IF: °· You have a fever. °· You have chills. °· You have increased bleeding from the radial site. Hold pressure on the site. °SEEK IMMEDIATE MEDICAL CARE IF: °· You have unusual pain at the radial site. °· You have redness, warmth, or swelling at the radial site. °· You have drainage (other than a small amount of blood on the dressing) from the radial site. °· The radial site is bleeding, and the bleeding does not stop after 30 minutes of holding steady pressure on the site. °· Your arm or hand becomes pale, cool, tingly, or numb. °  °This information is not intended to replace advice given to you by your health care provider. Make sure you discuss any questions you have with your health care provider. °  °Document Released: 08/20/2010 Document Revised: 08/08/2014 Document Reviewed: 02/03/2014 °Elsevier Interactive Patient Education ©2016 [employer] ° °

## 2016-03-07 NOTE — Interval H&P Note (Signed)
Cath Lab Visit (complete for each Cath Lab visit)  Clinical Evaluation Leading to the Procedure:   ACS: No.  Non-ACS:    Anginal Classification: CCS III  Anti-ischemic medical therapy: Maximal Therapy (2 or more classes of medications)  Non-Invasive Test Results: No non-invasive testing performed  Prior CABG: No previous CABG      History and Physical Interval Note:  03/07/2016 7:14 AM  Melvin Taylor  has presented today for surgery, with the diagnosis of cp  The various methods of treatment have been discussed with the patient and family. After consideration of risks, benefits and other options for treatment, the patient has consented to  Procedure(s): Left Heart Cath and Coronary Angiography (N/A) as a surgical intervention .  The patient's history has been reviewed, patient examined, no change in status, stable for surgery.  I have reviewed the patient's chart and labs.  Questions were answered to the patient's satisfaction.     Belva Crome III

## 2016-03-07 NOTE — CV Procedure (Signed)
   Moderate ostial circumflex, total occlusion of the RCA, moderately severe diffuse mid LAD within significant tortuosity.

## 2016-03-07 NOTE — H&P (View-Only) (Signed)
CARDIOLOGY CONSULT NOTE  Patient ID: Melvin Taylor MRN: [id number] DOB/AGE: 1966-06-02 51 y.o.  Admit date: (Not on file) Primary Physician: Rosita Fire, MD Referring Physician:   Reason for Consultation: chest pain  HPI: The patient is a 50 year old male with a history of hypertension, uncontrolled diabetes, cervical spinal stenosis, and hyperlipidemia referred for the evaluation of chest pain. ECG in March 2017 showed sinus rhythm with mild left axis deviation. HbA1c 10.3% 01/18/16.   He was reportedly hospitalized at [facility] for chest pain last week but I do not have any of these records. He said he had 2 episodes of chest pain. He had one which lasted approximately 2 hours and then subsided on its own. He had then been cleaning fish with his father and lifted up the bucket and experienced severe chest pain which he said "dropped me to the floor ". He had associated shortness of breath and he was in tears. He tells me his troponin was elevated at 0.22.  He has sleep apnea and uses CPAP. He has been smoking one pack of cigarettes daily for the past 5 or 6 years. He was recently started on Plavix for his presumed heart disease. He denies having undergone any noninvasive imaging while hospitalized. He denies ever experiencing chest pain or shortness of breath prior to this.  He used to walk to and from the store 2-3 miles daily but now can only get halfway to the store before having chest pain. He has used nitroglycerin since his visit to the hospital. He is also on atenolol.  Fam: Father had MI at 16.   Allergies  Allergen Reactions  . Aspirin Swelling    Current Outpatient Prescriptions  Medication Sig Dispense Refill  . ALPRAZolam (XANAX) 1 MG tablet Take 1 mg by mouth 2 (two) times daily.    Marland Kitchen atenolol (TENORMIN) 100 MG tablet Take 100 mg by mouth daily.      . clopidogrel (PLAVIX) 75 MG tablet Take 75 mg by mouth daily.    . cyclobenzaprine (FLEXERIL) 10 MG  tablet Take 1 tablet (10 mg total) by mouth 3 (three) times daily as needed for muscle spasms. 30 tablet 0  . divalproex (DEPAKOTE) 500 MG DR tablet Take 3 tablets (1,500 mg total) by mouth 2 (two) times daily. 540 tablet 4  . glucose blood (ACCU-CHEK AVIVA) test strip Use to test 2 times a day 100 each 3  . HYDROcodone-acetaminophen (NORCO) 7.5-325 MG tablet Take 1 tablet by mouth every 6 (six) hours as needed for moderate pain.    . Insulin Glargine (LANTUS SOLOSTAR [place]) Inject 24 Units into the skin at bedtime.    . levETIRAcetam (KEPPRA) 500 MG tablet Take 2 tablets (1,000 mg total) by mouth 2 (two) times daily. 360 tablet 4  . lovastatin (MEVACOR) 20 MG tablet Take 20 mg by mouth at bedtime.    . methocarbamol (ROBAXIN) 500 MG tablet Take 1 tablet (500 mg total) by mouth 3 (three) times daily. (Patient taking differently: Take 500 mg by mouth every 8 (eight) hours as needed for muscle spasms. ) 21 tablet 0  . methylPREDNISolone (MEDROL DOSEPAK) 4 MG TBPK tablet Take 4-24 mg by mouth See admin instructions. Day 1: 8 mg PO before breakfast, 4 mg after lunch and after dinner, and 8 mg at bedtime  Day 2: 4 mg PO before breakfast, after lunch, and after dinner and 8 mg at bedtime Day 3: 4 mg PO before breakfast, after lunch,  after dinner, and at bedtime Day 4: 4 mg PO before breakfast, after lunch, and at bedtime  Day 5: 4 mg PO before breakfast and at bedtime Day 6: 4 mg PO before breakfast    . nitroGLYCERIN (NITROSTAT) 0.4 MG SL tablet Place 0.4 mg under the tongue every 5 (five) minutes as needed for chest pain.     No current facility-administered medications for this visit.     Past Medical History:  Diagnosis Date  . Anxiety   . Bipolar 1 disorder ([place])   . Chronic back pain   . Depression   . Diabetes mellitus    Type II  . Hallucinations    "long history of them"  . Headache(784.0)   . History of gout   . HTN (hypertension)   . Insomnia   . Neuropathy ([place])   . Pain  management   . Schizophrenia ([place])   . Seizures ([place])    last sz between July 5-9th, 2016; epilepsy  . Shortness of breath dyspnea    with exertion  . Sleep apnea     Past Surgical History:  Procedure Laterality Date  . ANTERIOR CERVICAL DECOMP/DISCECTOMY FUSION N/A 12/14/2015   Procedure: ANTERIOR CERVICAL DECOMPRESSION/DISCECTOMY FUSION CERVICAL FIVE -SIX;  Surgeon: Earnie Larsson, MD;  Location: [place] [facility];  Service: Neurosurgery;  Laterality: N/A;  . BIOPSY  05/07/2015   Procedure: BIOPSY (Gastric);  Surgeon: [name], MD;  Location: [facility];  Service: Endoscopy;;  . COLONOSCOPY WITH PROPOFOL N/A 05/07/2015   [id number] diverticulosis, multiple colon polyps removed, tubular adenoma, serrated colon polyp. Next colonoscopy October 2019  . ESOPHAGEAL DILATION N/A 05/07/2015   Procedure: ESOPHAGEAL DILATION WITH 56FR MALONEY DILATOR;  Surgeon: [name], MD;  Location: [facility];  Service: Endoscopy;  Laterality: N/A;  . ESOPHAGOGASTRODUODENOSCOPY (EGD) WITH PROPOFOL N/A 05/07/2015   RMR: Status post dilation of normal esophagus. Gastritis.  Marland Kitchen KNEE SURGERY Left    arthroscopy  . MANDIBLE FRACTURE SURGERY    . POLYPECTOMY  05/07/2015   Procedure: POLYPECTOMY (Hepatic Flexure, Distal Transverse Colon, Rectal);  Surgeon: [name], MD;  Location: [facility];  Service: Endoscopy;;  . TONSILLECTOMY      Social History   Social History  . Marital status: Single    Spouse name: N/A  . Number of children: N/A  . Years of education: 11th grade   Occupational History  . unemployed Not Employed   Social History Main Topics  . Smoking status: Current Every Day Smoker    Packs/day: 1.00    Years: 30.00    Types: Cigarettes  . Smokeless tobacco: Never Used     Comment: 09/21/15 trying to quit, < 1 PPD  . Alcohol use Yes     Comment: 02/11/15 "drink a beer every now and then"  . Drug use: No     Comment: hx of cocaine use but denies using in over a year, denies marjuana use  01/18/15 and 02/11/15  . Sexual activity: Not Currently   Other Topics Concern  . Not on file   Social History Narrative   Lives alone   Drinks a cup of coffee a day       Prior to Admission medications   Medication Sig Start Date End Date Taking? Authorizing Provider  ALPRAZolam Duanne Moron) 1 MG tablet Take 1 mg by mouth 2 (two) times daily.   Yes Historical Provider, MD  atenolol (TENORMIN) 100 MG tablet Take 100 mg by mouth daily.  Yes Historical Provider, MD  clopidogrel (PLAVIX) 75 MG tablet Take 75 mg by mouth daily.   Yes Historical Provider, MD  cyclobenzaprine (FLEXERIL) 10 MG tablet Take 1 tablet (10 mg total) by mouth 3 (three) times daily as needed for muscle spasms. 12/14/15  Yes Earnie Larsson, MD  divalproex (DEPAKOTE) 500 MG DR tablet Take 3 tablets (1,500 mg total) by mouth 2 (two) times daily. 09/21/15  Yes Penni Bombard, MD  glucose blood (ACCU-CHEK AVIVA) test strip Use to test 2 times a day 10/26/15  Yes Cassandria Anger, MD  HYDROcodone-acetaminophen (NORCO) 7.5-325 MG tablet Take 1 tablet by mouth every 6 (six) hours as needed for moderate pain.   Yes Historical Provider, MD  Insulin Glargine (LANTUS SOLOSTAR [place]) Inject 24 Units into the skin at bedtime.   Yes Historical Provider, MD  levETIRAcetam (KEPPRA) 500 MG tablet Take 2 tablets (1,000 mg total) by mouth 2 (two) times daily. 09/21/15  Yes Penni Bombard, MD  lovastatin (MEVACOR) 20 MG tablet Take 20 mg by mouth at bedtime.   Yes Historical Provider, MD  methocarbamol (ROBAXIN) 500 MG tablet Take 1 tablet (500 mg total) by mouth 3 (three) times daily. Patient taking differently: Take 500 mg by mouth every 8 (eight) hours as needed for muscle spasms.  11/20/15  Yes Hollace Kinnier Sofia, PA-C  methylPREDNISolone (MEDROL DOSEPAK) 4 MG TBPK tablet Take 4-24 mg by mouth See admin instructions. Day 1: 8 mg PO before breakfast, 4 mg after lunch and after dinner, and 8 mg at bedtime  Day 2: 4 mg PO before breakfast, after  lunch, and after dinner and 8 mg at bedtime Day 3: 4 mg PO before breakfast, after lunch, after dinner, and at bedtime Day 4: 4 mg PO before breakfast, after lunch, and at bedtime  Day 5: 4 mg PO before breakfast and at bedtime Day 6: 4 mg PO before breakfast   Yes Historical Provider, MD  nitroGLYCERIN (NITROSTAT) 0.4 MG SL tablet Place 0.4 mg under the tongue every 5 (five) minutes as needed for chest pain.   Yes Historical Provider, MD     Review of systems complete and found to be negative unless listed above in HPI     Physical exam Blood pressure (!) 142/78, pulse 61, height 5\' 7"  (1.702 m), weight 147 lb (66.7 kg), SpO2 96 %. General: NAD Neck: No JVD, no thyromegaly or thyroid nodule.  Lungs: Diminished throughout, no rales, mild rhonchi b/l. CV: Nondisplaced PMI. Regular rate and rhythm, normal S1/S2, no S3/S4, no murmur.  No peripheral edema.  No carotid bruit.    Abdomen: Soft, nontender, no distention.  Skin: Intact without lesions or rashes.  Neurologic: Alert and oriented x 3.  Psych: Normal affect. Extremities: No clubbing or cyanosis.  HEENT: Normal.   ECG: Most recent ECG reviewed.  Labs:   Lab Results  Component Value Date   WBC 7.1 12/14/2015   HGB 14.5 12/14/2015   HCT 41.0 12/14/2015   MCV 88.2 12/14/2015   PLT 194 12/14/2015   No results for input(s): NA, K, CL, CO2, BUN, CREATININE, CALCIUM, PROT, BILITOT, ALKPHOS, ALT, AST, GLUCOSE in the last 168 hours.  Invalid input(s): LABALBU Lab Results  Component Value Date   CKTOTAL 260 (H) 02/25/2010   CKMB 4.4 (H) 02/25/2010   TROPONINI <0.03 05/13/2015    Lab Results  Component Value Date   CHOL  10/26/2010    162        ATP III  CLASSIFICATION:  <200     mg/dL   Desirable  200-239  mg/dL   Borderline High  >=240    mg/dL   High          CHOL  02/25/2010    159        ATP III CLASSIFICATION:  <200     mg/dL   Desirable  200-239  mg/dL   Borderline High  >=240    mg/dL   High           CHOL  08/03/2009    186        ATP III CLASSIFICATION:  <200     mg/dL   Desirable  200-239  mg/dL   Borderline High  >=240    mg/dL   High          Lab Results  Component Value Date   HDL 47 10/26/2010   HDL 27 (L) 02/25/2010   HDL 53 08/03/2009   Lab Results  Component Value Date   LDLCALC  10/26/2010    81        Total Cholesterol/HDL:CHD Risk Coronary Heart Disease Risk Table                     Men   Women  1/2 Average Risk   3.4   3.3  Average Risk       5.0   4.4  2 X Average Risk   9.6   7.1  3 X Average Risk  23.4   11.0        Use the calculated Patient Ratio above and the CHD Risk Table to determine the patient's CHD Risk.        ATP III CLASSIFICATION (LDL):  <100     mg/dL   Optimal  100-129  mg/dL   Near or Above                    Optimal  130-159  mg/dL   Borderline  160-189  mg/dL   High  >190     mg/dL   Very High   LDLCALC  02/25/2010    87        Total Cholesterol/HDL:CHD Risk Coronary Heart Disease Risk Table                     Men   Women  1/2 Average Risk   3.4   3.3  Average Risk       5.0   4.4  2 X Average Risk   9.6   7.1  3 X Average Risk  23.4   11.0        Use the calculated Patient Ratio above and the CHD Risk Table to determine the patient's CHD Risk.        ATP III CLASSIFICATION (LDL):  <100     mg/dL   Optimal  100-129  mg/dL   Near or Above                    Optimal  130-159  mg/dL   Borderline  160-189  mg/dL   High  >190     mg/dL   Very High   LDLCALC (H) 08/03/2009    112        Total Cholesterol/HDL:CHD Risk Coronary Heart Disease Risk Table  Men   Women  1/2 Average Risk   3.4   3.3  Average Risk       5.0   4.4  2 X Average Risk   9.6   7.1  3 X Average Risk  23.4   11.0        Use the calculated Patient Ratio above and the CHD Risk Table to determine the patient's CHD Risk.        ATP III CLASSIFICATION (LDL):  <100     mg/dL   Optimal  100-129  mg/dL   Near or Above                     Optimal  130-159  mg/dL   Borderline  160-189  mg/dL   High  >190     mg/dL   Very High   Lab Results  Component Value Date   TRIG 172 (H) 10/26/2010   TRIG 223 (H) 02/25/2010   TRIG 107 08/03/2009   Lab Results  Component Value Date   CHOLHDL 3.4 10/26/2010   CHOLHDL 5.9 02/25/2010   CHOLHDL 3.5 08/03/2009   No results found for: LDLDIRECT       Studies: No results found.  ASSESSMENT AND PLAN:  1. Chest pain and possible NSTEMI: His symptoms are very worrisome for ischemic heart disease. I will obtain records from [place]. I will proceed with left heart catheterization and coronary angiography. He is already taking Plavix and atenolol. I will switch lovastatin to Lipitor 40 mg daily. I will obtain an echocardiogram to evaluate cardiac structure and function.  2. Essential HTN: Mildly elevated. No changes for now.  3. Hyperlipidemia:  I will switch lovastatin to Lipitor 40 mg daily.  Dispo: fu after cath.   Signed: Kate Sable, M.D., F.A.C.C.  03/04/2016, 1:47 PM

## 2016-03-09 ENCOUNTER — Ambulatory Visit (HOSPITAL_COMMUNITY)
Admission: RE | Admit: 2016-03-09 | Discharge: 2016-03-09 | Disposition: A | Discharge status: Home / Self Care | Payer: Medicaid Other | Source: Ambulatory Visit | Attending: Cardiovascular Disease | Admitting: Cardiovascular Disease

## 2016-03-09 DIAGNOSIS — I209 Angina pectoris, unspecified: Secondary | ICD-10-CM | POA: Diagnosis not present

## 2016-03-09 DIAGNOSIS — I119 Hypertensive heart disease without heart failure: Secondary | ICD-10-CM | POA: Diagnosis not present

## 2016-03-09 DIAGNOSIS — I059 Rheumatic mitral valve disease, unspecified: Secondary | ICD-10-CM | POA: Insufficient documentation

## 2016-03-09 DIAGNOSIS — E119 Type 2 diabetes mellitus without complications: Secondary | ICD-10-CM | POA: Diagnosis not present

## 2016-03-09 DIAGNOSIS — R079 Chest pain, unspecified: Secondary | ICD-10-CM | POA: Diagnosis present

## 2016-03-09 LAB — ECHOCARDIOGRAM COMPLETE
AVLVOTPG: 5 mmHg
CHL CUP MV DEC (S): 303
CHL CUP RV SYS PRESS: 17 mmHg
CHL CUP TV REG PEAK VELOCITY: 186 cm/s
EERAT: 8.28
EWDT: 303 ms
FS: 39 % (ref 28–44)
IV/PV OW: 1.02
LA ID, A-P, ES: 34 mm
LA diam index: 1.92 cm/m2
LA vol index: 23.1 mL/m2
LA vol: 41 mL
LAVOLA4C: 41.1 mL
LDCA: 3.8 cm2
LEFT ATRIUM END SYS DIAM: 34 mm
LV E/e' medial: 8.28
LV E/e'average: 8.28
LV PW d: 12.4 mm — AB (ref 0.6–1.1)
LV TDI E'LATERAL: 9.36
LV TDI E'MEDIAL: 5.66
LV e' LATERAL: 9.36 cm/s
LV sys vol index: 10 mL/m2
LVDIAVOL: 51 mL — AB (ref 62–150)
LVDIAVOLIN: 29 mL/m2
LVOT SV: 85 mL
LVOT VTI: 22.4 cm
LVOT diameter: 22 mm
LVOTPV: 114 cm/s
LVSYSVOL: 17 mL — AB (ref 21–61)
MV Peak grad: 2 mmHg
MV pk E vel: 77.5 m/s
MVPKAVEL: 68.3 m/s
RV LATERAL S' VELOCITY: 12.1 cm/s
Simpson's disk: 67
Stroke v: 34 ml
TAPSE: 21.7 mm
TRMAXVEL: 186 cm/s

## 2016-03-09 NOTE — Progress Notes (Signed)
*  PRELIMINARY RESULTS* Echocardiogram 2D Echocardiogram has been performed.  Melvin Taylor 03/09/2016, 10:08 AM

## 2016-03-10 ENCOUNTER — Encounter: Payer: Self-pay | Admitting: "Endocrinology

## 2016-03-11 ENCOUNTER — Encounter: Payer: Self-pay | Admitting: "Endocrinology

## 2016-03-11 ENCOUNTER — Ambulatory Visit (INDEPENDENT_AMBULATORY_CARE_PROVIDER_SITE_OTHER): Payer: Medicaid Other | Admitting: "Endocrinology

## 2016-03-11 VITALS — BP 134/89 | HR 51 | Ht 67.0 in | Wt 147.0 lb

## 2016-03-11 DIAGNOSIS — Z9119 Patient's noncompliance with other medical treatment and regimen: Secondary | ICD-10-CM | POA: Diagnosis not present

## 2016-03-11 DIAGNOSIS — E782 Mixed hyperlipidemia: Secondary | ICD-10-CM | POA: Insufficient documentation

## 2016-03-11 DIAGNOSIS — Z91199 Patient's noncompliance with other medical treatment and regimen due to unspecified reason: Secondary | ICD-10-CM

## 2016-03-11 DIAGNOSIS — E785 Hyperlipidemia, unspecified: Secondary | ICD-10-CM | POA: Diagnosis not present

## 2016-03-11 DIAGNOSIS — E1159 Type 2 diabetes mellitus with other circulatory complications: Secondary | ICD-10-CM

## 2016-03-11 DIAGNOSIS — I10 Essential (primary) hypertension: Secondary | ICD-10-CM | POA: Diagnosis not present

## 2016-03-11 MED ORDER — INSULIN ASPART 100 UNIT/ML FLEXPEN: 10.0000 [IU] | PEN_INJECTOR | Freq: Three times a day (TID) | SUBCUTANEOUS | 2 refills | Status: DC

## 2016-03-11 MED ORDER — INSULIN ASPART 100 UNIT/ML FLEXPEN: 5.0000 [IU] | PEN_INJECTOR | Freq: Three times a day (TID) | SUBCUTANEOUS | 2 refills | Status: DC

## 2016-03-11 NOTE — Progress Notes (Signed)
Subjective:    Patient ID: Melvin Taylor, male    DOB: [birth year], PCP Rosita Fire, MD   Past Medical History:  Diagnosis Date  . Anxiety   . Bipolar 1 disorder ([place])   . Chronic back pain   . Depression   . Diabetes mellitus    Type II  . Hallucinations    "long history of them"  . Headache(784.0)   . History of gout   . HTN (hypertension)   . Insomnia   . Neuropathy ([place])   . Pain management   . Schizophrenia ([place])   . Seizures ([place])    last sz between July 5-9th, 2016; epilepsy  . Shortness of breath dyspnea    with exertion  . Sleep apnea    Past Surgical History:  Procedure Laterality Date  . ANTERIOR CERVICAL DECOMP/DISCECTOMY FUSION N/A 12/14/2015   Procedure: ANTERIOR CERVICAL DECOMPRESSION/DISCECTOMY FUSION CERVICAL FIVE -SIX;  Surgeon: Earnie Larsson, MD;  Location: [facility];  Service: Neurosurgery;  Laterality: N/A;  . BIOPSY  05/07/2015   Procedure: BIOPSY (Gastric);  Surgeon: [name], MD;  Location: [facility];  Service: Endoscopy;;  . CARDIAC CATHETERIZATION N/A 03/07/2016   Procedure: Left Heart Cath and Coronary Angiography;  Surgeon: Belva Crome, MD;  Location: [facility];  Service: Cardiovascular;  Laterality: N/A;  . COLONOSCOPY WITH PROPOFOL N/A 05/07/2015   [id number] diverticulosis, multiple colon polyps removed, tubular adenoma, serrated colon polyp. Next colonoscopy October 2019  . ESOPHAGEAL DILATION N/A 05/07/2015   Procedure: ESOPHAGEAL DILATION WITH 56FR MALONEY DILATOR;  Surgeon: [name], MD;  Location: [facility];  Service: Endoscopy;  Laterality: N/A;  . ESOPHAGOGASTRODUODENOSCOPY (EGD) WITH PROPOFOL N/A 05/07/2015   RMR: Status post dilation of normal esophagus. Gastritis.  Marland Kitchen KNEE SURGERY Left    arthroscopy  . MANDIBLE FRACTURE SURGERY    . POLYPECTOMY  05/07/2015   Procedure: POLYPECTOMY (Hepatic Flexure, Distal Transverse Colon, Rectal);  Surgeon: [name], MD;  Location: [facility];  Service: Endoscopy;;  .  TONSILLECTOMY     Social History   Social History  . Marital status: Single    Spouse name: N/A  . Number of children: N/A  . Years of education: 11th grade   Occupational History  . unemployed Not Employed   Social History Main Topics  . Smoking status: Current Every Day Smoker    Packs/day: 1.00    Years: 30.00    Types: Cigarettes  . Smokeless tobacco: Never Used     Comment: 09/21/15 trying to quit, < 1 PPD  . Alcohol use Yes     Comment: 02/11/15 "drink a beer every now and then"  . Drug use: No     Comment: hx of cocaine use but denies using in over a year, denies marjuana use 01/18/15 and 02/11/15  . Sexual activity: Not Currently   Other Topics Concern  . None   Social History Narrative   Lives alone   Drinks a cup of coffee a day   Outpatient Encounter Prescriptions as of 03/11/2016  Medication Sig  . ALPRAZolam (XANAX) 1 MG tablet Take 1 mg by mouth 2 (two) times daily.  Marland Kitchen atenolol (TENORMIN) 100 MG tablet Take 100 mg by mouth daily.    Marland Kitchen atorvastatin (LIPITOR) 40 MG tablet Take 1 tablet (40 mg total) by mouth daily.  . clopidogrel (PLAVIX) 75 MG tablet Take 75 mg by mouth daily.  . divalproex (DEPAKOTE) 500 MG DR tablet Take 3  tablets (1,500 mg total) by mouth 2 (two) times daily.  Marland Kitchen glucose blood (ACCU-CHEK AVIVA) test strip Use to test 2 times a day  . HYDROcodone-acetaminophen (NORCO) 7.5-325 MG tablet Take 1 tablet by mouth every 6 (six) hours as needed for moderate pain.  Marland Kitchen insulin aspart (NOVOLOG) 100 UNIT/ML FlexPen Inject 5-11 Units into the skin 3 (three) times daily with meals.  . Insulin Glargine (LANTUS SOLOSTAR [place]) Inject 30 Units into the skin at bedtime.  . isosorbide mononitrate (IMDUR) 60 MG 24 hr tablet Take 1 tablet (60 mg total) by mouth daily.  Marland Kitchen levETIRAcetam (KEPPRA) 500 MG tablet Take 2 tablets (1,000 mg total) by mouth 2 (two) times daily.  . nitroGLYCERIN (NITROSTAT) 0.4 MG SL tablet Place 0.4 mg under the tongue every 5 (five) minutes as  needed for chest pain.  . [DISCONTINUED] cyclobenzaprine (FLEXERIL) 10 MG tablet Take 1 tablet (10 mg total) by mouth 3 (three) times daily as needed for muscle spasms. (Patient not taking: Reported on 03/04/2016)  . [DISCONTINUED] insulin aspart (NOVOLOG) 100 UNIT/ML FlexPen Inject 10-16 Units into the skin 3 (three) times daily with meals.  . [DISCONTINUED] methocarbamol (ROBAXIN) 500 MG tablet Take 1 tablet (500 mg total) by mouth 3 (three) times daily. (Patient not taking: Reported on 03/04/2016)   No facility-administered encounter medications on file as of 03/11/2016.    ALLERGIES: Allergies  Allergen Reactions  . Aspirin Swelling   VACCINATION STATUS: Immunization History  Administered Date(s) Administered  . Influenza,trivalent, recombinat, inj, PF 05/02/2015  . Tdap 01/18/2015    Diabetes  He presents for his follow-up diabetic visit. He has type 2 diabetes mellitus. Onset time: He was diagnosed the approximate age of 89 years. His disease course has been worsening. There are no hypoglycemic associated symptoms. Pertinent negatives for hypoglycemia include no confusion, headaches, pallor or seizures. Associated symptoms include fatigue, polydipsia and polyuria. Pertinent negatives for diabetes include no chest pain, no polyphagia and no weakness. There are no hypoglycemic complications. Symptoms are worsening. Diabetic complications include heart disease. (He is seriously noncompliant patient. He is now diagnosed with coronary artery disease at [facility] since last visit. He is awaiting for his outpatient cardiology evaluation. This assay to be set for 03/24/2016.) Risk factors for coronary artery disease include diabetes mellitus, dyslipidemia, hypertension, male sex, tobacco exposure and sedentary lifestyle. Current diabetic treatment includes insulin injections. He is compliant with treatment none of the time. His weight is stable. He is following a generally unhealthy diet. When  asked about meal planning, he reported none. He has had a previous visit with a dietitian. He never participates in exercise. Home blood sugar record trend: He is logs are significant for persistent hyperglycemia, average for the last 7 days of 18 readings is 260 mg/dL. His breakfast blood glucose range is generally >200 mg/dl. His lunch blood glucose range is generally >200 mg/dl. His dinner blood glucose range is generally >200 mg/dl. His overall blood glucose range is >200 mg/dl. An ACE inhibitor/angiotensin II receptor blocker is not being taken.  Hyperlipidemia  This is a chronic problem. The current episode started more than 1 year ago. Exacerbating diseases include diabetes. Pertinent negatives include no chest pain, myalgias or shortness of breath. Current antihyperlipidemic treatment includes statins. Risk factors for coronary artery disease include diabetes mellitus, dyslipidemia, hypertension, a sedentary lifestyle and male sex.  Hypertension  Pertinent negatives include no chest pain, headaches, neck pain, palpitations or shortness of breath. Risk factors for coronary artery disease include smoking/tobacco  exposure, diabetes mellitus, dyslipidemia and male gender.     Review of Systems  Constitutional: Positive for fatigue. Negative for unexpected weight change.  HENT: Negative for dental problem, mouth sores and trouble swallowing.   Eyes: Negative for visual disturbance.  Respiratory: Negative for cough, choking, chest tightness, shortness of breath and wheezing.   Cardiovascular: Negative for chest pain, palpitations and leg swelling.  Gastrointestinal: Negative for abdominal distention, abdominal pain, constipation, diarrhea, nausea and vomiting.  Endocrine: Positive for polydipsia and polyuria. Negative for polyphagia.  Genitourinary: Negative for dysuria, flank pain, hematuria and urgency.  Musculoskeletal: Negative for back pain, gait problem, myalgias and neck pain.  Skin:  Negative for pallor, rash and wound.  Neurological: Negative for seizures, syncope, weakness, numbness and headaches.  Psychiatric/Behavioral: Negative.  Negative for confusion and dysphoric mood.    Objective:    BP 134/89   Pulse (!) 51   Ht 5\' 7"  (1.702 m)   Wt 147 lb (66.7 kg)   BMI 23.02 kg/m   Wt Readings from Last 3 Encounters:  03/11/16 147 lb (66.7 kg)  03/07/16 146 lb (66.2 kg)  03/04/16 147 lb (66.7 kg)    Physical Exam  Constitutional: He is oriented to person, place, and time. He appears well-developed and well-nourished. He is cooperative. No distress.  HENT:  Head: Normocephalic and atraumatic.  Eyes: EOM are normal.  Neck: Normal range of motion. Neck supple. No tracheal deviation present. No thyromegaly present.  Cardiovascular: Normal rate, S1 normal, S2 normal and normal heart sounds.  Exam reveals no gallop.   No murmur heard. Pulses:      Dorsalis pedis pulses are 1+ on the right side, and 1+ on the left side.       Posterior tibial pulses are 1+ on the right side, and 1+ on the left side.  Pulmonary/Chest: Breath sounds normal. No respiratory distress. He has no wheezes.  Abdominal: Soft. Bowel sounds are normal. He exhibits no distension. There is no tenderness. There is no guarding and no CVA tenderness.  Musculoskeletal: He exhibits no edema.       Right shoulder: He exhibits no swelling and no deformity.  Neurological: He is alert and oriented to person, place, and time. He has normal strength and normal reflexes. No cranial nerve deficit or sensory deficit. Gait normal.  Skin: Skin is warm and dry. No rash noted. No cyanosis. Nails show no clubbing.  Psychiatric: His speech is normal. Cognition and memory are normal.  Noncompliant behavior.   Chemistry (most recent): Lab Results  Component Value Date   NA 137 03/04/2016   K 3.6 03/04/2016   CL 103 03/04/2016   CO2 30 03/04/2016   BUN 19 03/04/2016   CREATININE 0.98 03/04/2016   Diabetic Labs  (most recent): Lab Results  Component Value Date   HGBA1C 10.3 (H) 01/18/2016   HGBA1C 8.3 (H) 10/19/2015   HGBA1C 9.8 07/03/2015   Lipid Panel     Component Value Date/Time   CHOL  10/26/2010 0458    162        ATP III CLASSIFICATION:  <200     mg/dL   Desirable  200-239  mg/dL   Borderline High  >=240    mg/dL   High          TRIG 172 (H) 10/26/2010 0458   HDL 47 10/26/2010 0458   CHOLHDL 3.4 10/26/2010 0458   VLDL 34 10/26/2010 0458   LDLCALC  10/26/2010 0458    81  Total Cholesterol/HDL:CHD Risk Coronary Heart Disease Risk Table                     Men   Women  1/2 Average Risk   3.4   3.3  Average Risk       5.0   4.4  2 X Average Risk   9.6   7.1  3 X Average Risk  23.4   11.0        Use the calculated Patient Ratio above and the CHD Risk Table to determine the patient's CHD Risk.        ATP III CLASSIFICATION (LDL):  <100     mg/dL   Optimal  100-129  mg/dL   Near or Above                    Optimal  130-159  mg/dL   Borderline  160-189  mg/dL   High  >190     mg/dL   Very High    Assessment & Plan:   1. Uncontrolled type 2 diabetes mellitus with complication, with long-term current use of insulin ([place])  - diabetes is  complicated by Coronary artery disease since last visit, noncompliance and patient remains at a high risk for more acute and chronic complications of diabetes which include CAD, CVA, CKD, retinopathy, and neuropathy. These are all discussed in detail with the patient.  Patient came with out his meter, And logs showed rare random monitoring of blood glucose averaging more than 200 mg/dL. - his  recent A1c 8.3% improving from 15.2 %.  Glucose logs and insulin administration records pertaining to this visit,  to be scanned into patient's records.  Recent labs reviewed.   - I have re-counseled the patient on diet management   by adopting a carbohydrate restricted / protein rich  Diet.  - Suggestion is made for patient to avoid simple  carbohydrates   from their diet including Cakes , Desserts, Ice Cream,  Soda (  diet and regular) , Sweet Tea , Candies,  Chips, Cookies, Artificial Sweeteners,   and "Sugar-free" Products .  This will help patient to have stable blood glucose profile and potentially avoid unintended  Weight gain.  - Patient is advised to stick to a routine mealtimes to eat 3 meals  a day and avoid unnecessary snacks ( to snack only to correct hypoglycemia).  - The patient  has been  scheduled with Jearld Fenton, RDN, CDE for individualized DM education.  - I have approached patient with the following individualized plan to manage diabetes and patient reluctantly accepts .  - Based on his glucose profile and complication status, he will need basal/bolus insulin to treat his diabetes. However, unfortunately this patient has never shown adequate engagement for safe use of insulin.  - This time he promises to engage better.  I urged him to continue monitoring blood glucose before meals and at bedtime.   - In the meantime, I will increase his Lantus to 30 units daily at bedtime, initiate NovoLog 5 units 3 times a day before meals for pre-meal blood glucose above 90 mg/dL plus correction.  - I will discontinue his  metformin .  - He is not a good candidate for incretin therapy . - Patient specific target  for A1c; LDL, HDL, Triglycerides, and  Waist Circumference were discussed in detail.  2) BP/HTN: Uncontrolled. Continue current medications. 3)  Weight/Diet: CDE consult in progress, exercise, and carbohydrates information provided.  4) medical noncompliance: I counseled him to refocus for better care of diabetes. 5) Chronic Care/Health Maintenance:  -Patient  Is on Statin medications and encouraged to continue to follow up with Ophthalmology, Podiatrist at least yearly or according to recommendations, and advised to quit smoking. I have recommended yearly flu vaccine and pneumonia vaccination at least every  5 years; moderate intensity exercise for up to 150 minutes weekly; and  sleep for at least 7 hours a day. - I have urged him to maintain his scheduled outpatient cardiology evaluation given his recent diagnosis with coronary artery disease at [facility]. This outpatient cardiology evaluation is said to be scheduled for 03/24/2016. I advised him to go to emergency room if he develops chest pain in the interim.  - 25 minutes of time was spent on the care of this patient , 50% of which was applied for counseling on diabetes complications and their preventions.  - I advised patient to maintain close follow up with [facility], MD for primary care needs.  Patient is asked to bring meter and  blood glucose logs during their next visit.   Follow up plan: -Return in about 2 weeks (around 03/25/2016) for follow up with pre-visit labs, meter, and logs.  Glade Lloyd, MD Phone: [phone]  Fax: [phone]   03/11/2016, 10:14 AM

## 2016-03-17 ENCOUNTER — Telehealth: Payer: Self-pay | Admitting: Cardiology

## 2016-03-17 ENCOUNTER — Ambulatory Visit (HOSPITAL_COMMUNITY)
Admission: RE | Admit: 2016-03-17 | Discharge: 2016-03-17 | Disposition: A | Discharge status: Home / Self Care | Payer: Medicaid Other | Source: Ambulatory Visit | Attending: Internal Medicine | Admitting: Internal Medicine

## 2016-03-17 ENCOUNTER — Other Ambulatory Visit: Payer: Self-pay | Admitting: Internal Medicine

## 2016-03-17 ENCOUNTER — Ambulatory Visit (INDEPENDENT_AMBULATORY_CARE_PROVIDER_SITE_OTHER): Payer: Medicaid Other | Admitting: Adult Health

## 2016-03-17 ENCOUNTER — Encounter: Payer: Self-pay | Admitting: Adult Health

## 2016-03-17 VITALS — BP 122/80 | HR 53 | Ht 67.0 in | Wt 146.0 lb

## 2016-03-17 DIAGNOSIS — M25531 Pain in right wrist: Secondary | ICD-10-CM | POA: Diagnosis not present

## 2016-03-17 DIAGNOSIS — T81718A Complication of other artery following a procedure, not elsewhere classified, initial encounter: Secondary | ICD-10-CM

## 2016-03-17 DIAGNOSIS — I729 Aneurysm of unspecified site: Secondary | ICD-10-CM

## 2016-03-17 DIAGNOSIS — Z720 Tobacco use: Secondary | ICD-10-CM

## 2016-03-17 DIAGNOSIS — R609 Edema, unspecified: Secondary | ICD-10-CM | POA: Diagnosis not present

## 2016-03-17 DIAGNOSIS — R072 Precordial pain: Secondary | ICD-10-CM

## 2016-03-17 DIAGNOSIS — I2510 Atherosclerotic heart disease of native coronary artery without angina pectoris: Secondary | ICD-10-CM

## 2016-03-17 DIAGNOSIS — I998 Other disorder of circulatory system: Secondary | ICD-10-CM | POA: Insufficient documentation

## 2016-03-17 DIAGNOSIS — E876 Hypokalemia: Secondary | ICD-10-CM | POA: Diagnosis not present

## 2016-03-17 DIAGNOSIS — E7800 Pure hypercholesterolemia, unspecified: Secondary | ICD-10-CM

## 2016-03-17 MED ORDER — POTASSIUM CHLORIDE ER 10 MEQ PO TBCR: 10.0000 meq | EXTENDED_RELEASE_TABLET | Freq: Every day | ORAL | 1 refills | Status: DC

## 2016-03-17 MED ORDER — CEPHALEXIN 500 MG PO CAPS: 500.0000 mg | ORAL_CAPSULE | Freq: Two times a day (BID) | ORAL | 0 refills | Status: DC

## 2016-03-17 NOTE — Patient Instructions (Addendum)
Your physician recommends that you schedule a follow-up appointment in:  1 month K lawrence NP  Get blood work today:BMET   START Potassium 10 meq daily   Take antibiotic Keflex 500 mg , 1 tablet twice a day for 7 days   Take pain medication, Tramadol 50 mg every 8 hrs if needed, NO REFILLS     Thank you for choosing [facility] !

## 2016-03-17 NOTE — Progress Notes (Signed)
Name: Melvin Taylor    DOB: 1966/07/09  Age: 50 y.o.  MR#: [id number]       PCP:  Rosita Fire, MD      Insurance: Payor: MEDICAID [place] / Plan: MEDICAID [place] ACCESS / Product Type: *No Product type* /   CC:   No chief complaint on file.   VS Vitals:   03/17/16 1350  BP: 122/80  Pulse: (!) 53  SpO2: 99%  Weight: 146 lb (66.2 kg)  Height: 5\' 7"  (1.702 m)    Weights Current Weight  03/17/16 146 lb (66.2 kg)  03/11/16 147 lb (66.7 kg)  03/07/16 146 lb (66.2 kg)    Blood Pressure  BP Readings from Last 3 Encounters:  03/17/16 122/80  03/11/16 134/89  03/07/16 129/80     Admit date:  (Not on file) Last encounter with RMR:  Visit date not found   Allergy Aspirin  Current Outpatient Prescriptions  Medication Sig Dispense Refill  . ALPRAZolam (XANAX) 1 MG tablet Take 1 mg by mouth 2 (two) times daily.    Marland Kitchen atenolol (TENORMIN) 100 MG tablet Take 100 mg by mouth daily.      Marland Kitchen atorvastatin (LIPITOR) 40 MG tablet Take 1 tablet (40 mg total) by mouth daily. 90 tablet 0  . clopidogrel (PLAVIX) 75 MG tablet Take 75 mg by mouth daily.    . divalproex (DEPAKOTE) 500 MG DR tablet Take 3 tablets (1,500 mg total) by mouth 2 (two) times daily. 540 tablet 4  . glucose blood (ACCU-CHEK AVIVA) test strip Use to test 2 times a day 100 each 3  . HYDROcodone-acetaminophen (NORCO) 7.5-325 MG tablet Take 1 tablet by mouth every 6 (six) hours as needed for moderate pain.    Marland Kitchen insulin aspart (NOVOLOG) 100 UNIT/ML FlexPen Inject 5-11 Units into the skin 3 (three) times daily with meals. 5 pen 2  . Insulin Glargine (LANTUS SOLOSTAR [place]) Inject 30 Units into the skin at bedtime.    . isosorbide mononitrate (IMDUR) 60 MG 24 hr tablet Take 1 tablet (60 mg total) by mouth daily. 30 tablet 5  . levETIRAcetam (KEPPRA) 500 MG tablet Take 2 tablets (1,000 mg total) by mouth 2 (two) times daily. 360 tablet 4  . nitroGLYCERIN (NITROSTAT) 0.4 MG SL tablet Place 0.4 mg under the tongue every 5 (five) minutes as  needed for chest pain.     No current facility-administered medications for this visit.     Discontinued Meds:   There are no discontinued medications.  Patient Active Problem List   Diagnosis Date Noted  . Hyperlipidemia 03/11/2016  . Ischemic chest pain ([place])   . NSTEMI (non-ST elevated myocardial infarction) ([place])   . Coronary atherosclerosis of native coronary artery 03/03/2016  . Cervical spinal stenosis 12/14/2015  . Loss of weight 08/25/2015  . Essential hypertension, benign 07/23/2015  . Personal history of noncompliance with medical treatment, presenting hazards to health 07/23/2015  . Abdominal pain 06/08/2015  . Constipation 06/08/2015  . History of colonic polyps   . Diverticulosis of colon without hemorrhage   . Mucosal abnormality of stomach   . Encounter for screening colonoscopy 04/16/2015  . Dysphagia 04/16/2015  . Partial symptomatic epilepsy with complex partial seizures, intractable, without status epilepticus ([place]) 11/10/2014  . DM type 2 causing vascular disease ([place]) 06/29/2014  . Musculoskeletal pain 06/29/2014  . Hyperglycemia without ketosis 09/07/2013  . 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 Knee pain 12/28/2010  . Medial meniscus, posterior horn derangement 12/28/2010  LABS    Component Value Date/Time   NA 137 03/04/2016 1509   NA 140 01/18/2016 0856   NA 138 12/14/2015 0854   K 3.6 03/04/2016 1509   K 4.2 01/18/2016 0856   K 3.8 12/14/2015 0854   CL 103 03/04/2016 1509   CL 101 01/18/2016 0856   CL 103 12/14/2015 0854   CO2 30 03/04/2016 1509   CO2 31 01/18/2016 0856   CO2 27 12/14/2015 0854   GLUCOSE 135 (H) 03/04/2016 1509   GLUCOSE 289 (H) 01/18/2016 0856   GLUCOSE 62 (L) 12/14/2015 0854   BUN 19 03/04/2016 1509   BUN 12 01/18/2016 0856   BUN 15 12/14/2015 0854   CREATININE 0.98 03/04/2016 1509   CREATININE 0.91 01/18/2016 0856   CREATININE 0.98 12/14/2015 0854   CREATININE 0.96 10/19/2015 1006   CREATININE  0.85 10/16/2015 1344   CALCIUM 8.4 (L) 03/04/2016 1509   CALCIUM 9.0 01/18/2016 0856   CALCIUM 8.8 (L) 12/14/2015 0854   GFRNONAA >60 03/04/2016 1509   GFRNONAA >60 12/14/2015 0854   GFRNONAA >60 10/16/2015 1344   GFRAA >60 03/04/2016 1509   GFRAA >60 12/14/2015 0854   GFRAA >60 10/16/2015 1344   CMP     Component Value Date/Time   NA 137 03/04/2016 1509   K 3.6 03/04/2016 1509   CL 103 03/04/2016 1509   CO2 30 03/04/2016 1509   GLUCOSE 135 (H) 03/04/2016 1509   BUN 19 03/04/2016 1509   CREATININE 0.98 03/04/2016 1509   CREATININE 0.91 01/18/2016 0856   CALCIUM 8.4 (L) 03/04/2016 1509   PROT 6.2 (L) 03/04/2016 1509   ALBUMIN 3.7 03/04/2016 1509   AST 16 03/04/2016 1509   ALT 23 03/04/2016 1509   ALKPHOS 41 03/04/2016 1509   BILITOT 0.5 03/04/2016 1509   GFRNONAA >60 03/04/2016 1509   GFRAA >60 03/04/2016 1509       Component Value Date/Time   WBC 6.0 03/04/2016 1509   WBC 7.1 12/14/2015 0854   WBC 5.9 10/16/2015 1344   HGB 13.5 03/04/2016 1509   HGB 14.5 12/14/2015 0854   HGB 15.4 10/16/2015 1344   HCT 37.3 (L) 03/04/2016 1509   HCT 41.0 12/14/2015 0854   HCT 43.3 10/16/2015 1344   MCV 88.8 03/04/2016 1509   MCV 88.2 12/14/2015 0854   MCV 89.3 10/16/2015 1344    Lipid Panel     Component Value Date/Time   CHOL  10/26/2010 0458    162        ATP III CLASSIFICATION:  <200     mg/dL   Desirable  200-239  mg/dL   Borderline High  >=240    mg/dL   High          TRIG 172 (H) 10/26/2010 0458   HDL 47 10/26/2010 0458   CHOLHDL 3.4 10/26/2010 0458   VLDL 34 10/26/2010 0458   LDLCALC  10/26/2010 0458    81        Total Cholesterol/HDL:CHD Risk Coronary Heart Disease Risk Table                     Men   Women  1/2 Average Risk   3.4   3.3  Average Risk       5.0   4.4  2 X Average Risk   9.6   7.1  3 X Average Risk  23.4   11.0        Use the calculated Patient Ratio above and  the CHD Risk Table to determine the patient's CHD Risk.        ATP III  CLASSIFICATION (LDL):  <100     mg/dL   Optimal  100-129  mg/dL   Near or Above                    Optimal  130-159  mg/dL   Borderline  160-189  mg/dL   High  >190     mg/dL   Very High    ABG    Component Value Date/Time   PHART 7.404 09/07/2013 1235   PCO2ART 42.6 09/07/2013 1235   PO2ART 81.0 09/07/2013 1235   HCO3 20.0 06/29/2014 0945   TCO2 17.9 06/29/2014 0945   ACIDBASEDEF 5.4 (H) 06/29/2014 0945   O2SAT 49.0 06/29/2014 0945     Lab Results  Component Value Date   TSH 0.58 10/19/2015   BNP (last 3 results) No results for input(s): BNP in the last 8760 hours.  ProBNP (last 3 results) No results for input(s): PROBNP in the last 8760 hours.  Cardiac Panel (last 3 results) No results for input(s): CKTOTAL, CKMB, TROPONINI, RELINDX in the last 72 hours.  Iron/TIBC/Ferritin/ %Sat No results found for: IRON, TIBC, FERRITIN, IRONPCTSAT   EKG Orders placed or performed in visit on 03/17/16  . EKG 12-Lead     Prior Assessment and Plan Problem List as of 03/17/2016 Reviewed: 03/11/2016 10:22 AM by Glade Lloyd, MD     Cardiovascular and Mediastinum   DM type 2 causing vascular disease ([place])   Essential hypertension, benign   Coronary atherosclerosis of native coronary artery   NSTEMI (non-ST elevated myocardial infarction) [facility])     Digestive   Dysphagia   Last Assessment & Plan 04/16/2015 Office Visit Edited 04/17/2015  4:00 PM by Carlis Stable, NP    Patient with intermittent dysphagia. When he 7 episode needs to swallow copious months of fluids to get food to pass. States that sometimes happens with every meal. Never had an upper endoscopy before. Cannot rule out stricture, web, ring. We'll plan for an endoscopy in addition to his required screening colonoscopy in the OR propofol as noted below. In the meantime, given advice regarding eating smaller meals, chewing adequately, eating softer foods.  Proceed with EGD +/- dilation with Dr. Gala Romney in near future: the risks,  benefits, and alternatives have been discussed with the patient in detail. The patient states understanding and desires to proceed.  Procedure and the OR with propofol. See notes regarding procedure below.      Diverticulosis of colon without hemorrhage   Mucosal abnormality of stomach   Constipation   Last Assessment & Plan 06/08/2015 Office Visit Written 06/08/2015  1:53 PM by Carlis Stable, NP    The patient has some elements constipation including required straining and prolonged time for having a bowel movement although he does have a bowel movement a day. He could be having retained stool and I will order an abdominal x-ray to check for this as well as any overt abnormalities. We'll also started on MiraLAX 17 g once a day to promote easier stool passage. Return for follow-up in 6 weeks.        Nervous and Auditory   Partial symptomatic epilepsy with complex partial seizures, intractable, without status epilepticus (HCC)     Musculoskeletal and Integument   Medial meniscus, posterior horn derangement     Other   Knee pain   Degeneration disease of medial  meniscus   Hyperglycemia without ketosis   Musculoskeletal pain   Encounter for screening colonoscopy   Last Assessment & Plan 04/16/2015 Office Visit Written 04/17/2015  3:58 PM by Carlis Stable, NP    Patient referred for screening colonoscopy. Has never had a colonoscopy before. Is having some lower abdominal pain which is crampy and occasionally sharp. Also with dysphagia symptoms as noted above. Admits subjective weight loss of 100 pounds in a few months. Objectively, his weight loss is been much more subtle including 15 pounds the past 9 months and 60 pounds in the past 4-1/2 years. He has diabetic and this could be somewhat related. We'll proceed with colonoscopy in addition to endoscopy as noted above.  Proceed with TCS with Dr. Gala Romney in near future: the risks, benefits, and alternatives have been discussed with the patient in  detail. The patient states understanding and desires to proceed.  The patient is not on any anticoagulants. He is on Depakote, Neurontin, Keppra, Ultram. History of cocaine use although he states he has not used in over a year. History of marijuana use, although he states he has not recently use. We'll need a urine drug screen at preop visit. Due to medications we'll plan for procedure and the OR propofol.  His blood pressure is a bit high today, he states a new blood pressure medication has been called into his pharmacy but he has not picked it up or become yet. I stressed the importance of following prescribed blood pressure medications and emphasized that his blood pressure is not controlled at his preop visit his procedure we'll likely be canceled. He verbalized understanding.      History of colonic polyps   Abdominal pain   Last Assessment & Plan 08/25/2015 Office Visit Written 08/25/2015  9:57 AM by Mahala Menghini, PA-C    50 year old gentleman who presents for follow-up of abdominal pain, weight loss. Doing very well this time. Symptoms 100% resolved. Has gained 10 pounds since last OV. Currently on PPI as well as MiraLAX when necessary. Bowel function normal. Appetite improved. Continue current regimen. Office visit as needed. Otherwise he will be due for surveillance colonoscopy in 3 years as planned.        Personal history of noncompliance with medical treatment, presenting hazards to health   Loss of weight   Cervical spinal stenosis   Ischemic chest pain ([place])   Hyperlipidemia       Imaging: Dg Chest 2 View  Result Date: 03/04/2016 CLINICAL DATA:  Hypertension, preop for cardiac catheterization. EXAM: CHEST  2 VIEW COMPARISON:  Radiograph of February 23, 2016. FINDINGS: The heart size and mediastinal contours are within normal limits. Both lungs are clear. No pneumothorax or pleural effusion is noted. The visualized skeletal structures are unremarkable. IMPRESSION: No active  cardiopulmonary disease. Electronically Signed   By: Marijo Conception, M.D.   On: 03/04/2016 15:19

## 2016-03-17 NOTE — Telephone Encounter (Signed)
Pt had heart cath 10 days ago, has had 4 days increased swelling and pain in wrist

## 2016-03-17 NOTE — Progress Notes (Signed)
Cardiology Office Note   Date:  03/17/2016   ID:  Melvin Taylor, DOB 1966/06/22, MRN [id number]  PCP:  Rosita Fire, MD  Cardiologist:  Ross/ Jory Sims, NP   Chief Complaint  Patient presents with  . Coronary Artery Disease  . Arm Pain    Right wrist cath site      History of Present Illness: Melvin Taylor is a 50 y.o. male who presents for ongoing assessment and management of coronary artery disease. The patient recently had outpatient cardiac catheterization by Dr. Daneen Schick dated 03/07/2016.   Ost Cx lesion, 65 %stenosed.  Mid RCA lesion, 100 %stenosed.  Dist RCA lesion, 100 %stenosed.  1st Diag lesion, 70 %stenosed.  1st Sept lesion, 95 %stenosed.  Mid LAD to Dist LAD lesion, 75 %stenosed.  The left ventricular ejection fraction is 50-55% by visual estimate.  The left ventricular systolic function is normal.  LV end diastolic pressure is mildly elevated.    Severe coronary artery disease with total occlusion of the mid RCA filling left-to-right by collaterals. Severe diffuse mid LAD 75% stenosis within a calcified tortuous segment. Moderate eccentric ostial circumflex 65% stenosis.  Overall normal left ventricular function, with upper normal LVEDP, and estimated ejection fraction of 55%. He was started on isosorbide 60 mg daily, smoking cessation was strongly recommended. If he failed medical therapy could consider bypass surgery given total occlusion of RCA and segmental tortuous mid LAD, which was believed to be treated best surgically versus medically with stenting (due to allergy to aspirin tortuosity calcification long segment of disease).  After catheterization the patient did well with the exception of some increased swelling in the right wrist at the catheter insertion site. He also had some pain and radiation in posterior right wrist.he waited several days that showed it to his work site nurse who suggested that he be seen. He called our  office, was directed to radiology to have stat arterial ultrasound to evaluate for DVT.  IMPRESSION: Edema/ ecchymosis overlying the right radial artery puncture site with no evidence of pseudoaneurysm. Unremarkable radial artery waveform. Mild swelling with possible early cellulitis.     Past Medical History:  Diagnosis Date  . Anxiety   . Bipolar 1 disorder ([place])   . Chronic back pain   . Depression   . Diabetes mellitus    Type II  . Hallucinations    "long history of them"  . Headache(784.0)   . History of gout   . HTN (hypertension)   . Insomnia   . Neuropathy ([place])   . Pain management   . Schizophrenia ([place])   . Seizures ([place])    last sz between July 5-9th, 2016; epilepsy  . Shortness of breath dyspnea    with exertion  . Sleep apnea     Past Surgical History:  Procedure Laterality Date  . ANTERIOR CERVICAL DECOMP/DISCECTOMY FUSION N/A 12/14/2015   Procedure: ANTERIOR CERVICAL DECOMPRESSION/DISCECTOMY FUSION CERVICAL FIVE -SIX;  Surgeon: Earnie Larsson, MD;  Location: [facility] ORS;  Service: Neurosurgery;  Laterality: N/A;  . BIOPSY  05/07/2015   Procedure: BIOPSY (Gastric);  Surgeon: [name], MD;  Location: [facility];  Service: Endoscopy;;  . CARDIAC CATHETERIZATION N/A 03/07/2016   Procedure: Left Heart Cath and Coronary Angiography;  Surgeon: Belva Crome, MD;  Location: [facility] LAB;  Service: Cardiovascular;  Laterality: N/A;  . COLONOSCOPY WITH PROPOFOL N/A 05/07/2015   [id number] diverticulosis, multiple colon polyps removed, tubular adenoma, serrated colon polyp. Next colonoscopy  October 2019  . ESOPHAGEAL DILATION N/A 05/07/2015   Procedure: ESOPHAGEAL DILATION WITH 56FR MALONEY DILATOR;  Surgeon: [name], MD;  Location: [facility];  Service: Endoscopy;  Laterality: N/A;  . ESOPHAGOGASTRODUODENOSCOPY (EGD) WITH PROPOFOL N/A 05/07/2015   RMR: Status post dilation of normal esophagus. Gastritis.  Marland Kitchen KNEE SURGERY Left    arthroscopy  . MANDIBLE  FRACTURE SURGERY    . POLYPECTOMY  05/07/2015   Procedure: POLYPECTOMY (Hepatic Flexure, Distal Transverse Colon, Rectal);  Surgeon: [name], MD;  Location: [facility];  Service: Endoscopy;;  . TONSILLECTOMY       Current Outpatient Prescriptions  Medication Sig Dispense Refill  . ALPRAZolam (XANAX) 1 MG tablet Take 1 mg by mouth 2 (two) times daily.    Marland Kitchen atenolol (TENORMIN) 100 MG tablet Take 100 mg by mouth daily.      Marland Kitchen atorvastatin (LIPITOR) 40 MG tablet Take 1 tablet (40 mg total) by mouth daily. 90 tablet 0  . clopidogrel (PLAVIX) 75 MG tablet Take 75 mg by mouth daily.    . divalproex (DEPAKOTE) 500 MG DR tablet Take 3 tablets (1,500 mg total) by mouth 2 (two) times daily. 540 tablet 4  . glucose blood (ACCU-CHEK AVIVA) test strip Use to test 2 times a day 100 each 3  . HYDROcodone-acetaminophen (NORCO) 7.5-325 MG tablet Take 1 tablet by mouth every 6 (six) hours as needed for moderate pain.    Marland Kitchen insulin aspart (NOVOLOG) 100 UNIT/ML FlexPen Inject 5-11 Units into the skin 3 (three) times daily with meals. 5 pen 2  . Insulin Glargine (LANTUS SOLOSTAR [place]) Inject 30 Units into the skin at bedtime.    . isosorbide mononitrate (IMDUR) 60 MG 24 hr tablet Take 1 tablet (60 mg total) by mouth daily. 30 tablet 5  . levETIRAcetam (KEPPRA) 500 MG tablet Take 2 tablets (1,000 mg total) by mouth 2 (two) times daily. 360 tablet 4  . nitroGLYCERIN (NITROSTAT) 0.4 MG SL tablet Place 0.4 mg under the tongue every 5 (five) minutes as needed for chest pain.     No current facility-administered medications for this visit.     Allergies:   Aspirin    Social History:  The patient  reports that he has been smoking Cigarettes.  He has a 30.00 pack-year smoking history. He has never used smokeless tobacco. He reports that he drinks alcohol. He reports that he does not use drugs.   Family History:  The patient's family history includes Diabetes in his father; Hypertension in his father; Stroke in his  sister.    ROS: All other systems are reviewed and negative. Unless otherwise mentioned in H&P    PHYSICAL EXAM: VS:  BP 122/80   Pulse (!) 53   Ht 5\' 7"  (1.702 m)   Wt 146 lb (66.2 kg)   SpO2 99%   BMI 22.87 kg/m  , BMI Body mass index is 22.87 kg/m. GEN: Well nourished, well developed, in no acute distress  HEENT: normal  Neck: no JVD, carotid bruits, or masses Cardiac: RRR; no murmurs, rubs, or gallops,no edema  Respiratory:  clear to auscultation bilaterally, normal work of breathing GI: soft, nontender, nondistended, + BS MS: no deformity or atrophy Right wrist catheter insertion site has pea-sized raised area with mild erythema, ecchymosis distal to the site with pain on palpation. Skin: warm and dry, no rash Neuro:  Strength and sensation are intact Psych: euthymic mood, full affect  Recent Labs: 10/19/2015: TSH 0.58 03/04/2016: ALT 23; BUN  19; Creatinine, Ser 0.98; Hemoglobin 13.5; Platelets 200; Potassium 3.6; Sodium 137    Lipid Panel    Component Value Date/Time   CHOL  10/26/2010 0458    162        ATP III CLASSIFICATION:  <200     mg/dL   Desirable  200-239  mg/dL   Borderline High  >=240    mg/dL   High          TRIG 172 (H) 10/26/2010 0458   HDL 47 10/26/2010 0458   CHOLHDL 3.4 10/26/2010 0458   VLDL 34 10/26/2010 0458   LDLCALC  10/26/2010 0458    81        Total Cholesterol/HDL:CHD Risk Coronary Heart Disease Risk Table                     Men   Women  1/2 Average Risk   3.4   3.3  Average Risk       5.0   4.4  2 X Average Risk   9.6   7.1  3 X Average Risk  23.4   11.0        Use the calculated Patient Ratio above and the CHD Risk Table to determine the patient's CHD Risk.        ATP III CLASSIFICATION (LDL):  <100     mg/dL   Optimal  100-129  mg/dL   Near or Above                    Optimal  130-159  mg/dL   Borderline  160-189  mg/dL   High  >190     mg/dL   Very High      Wt Readings from Last 3 Encounters:  03/17/16 146 lb  (66.2 kg)  03/11/16 147 lb (66.7 kg)  03/07/16 146 lb (66.2 kg)     ASSESSMENT AND PLAN:  1.  Multivessel coronary artery disease:mid and distal 100% occlusion of the RCA with 75% occlusion of the LAD. He was tre he is unable to take aspirin. He is on Plavix, atorvastatin, and isosorbide along with atenolol 100 mg daily. I have given him reassurance of cardiac catheterization insertion site that he does not have a blood clot or pseudoaneurysm. The patient will be started on prophylactic Keflex 500 mg twice a day for 7 days.  He is also given tramadol 50 mg by mouth Q8 hours when necessary with no refills.   2. Hypokalemia: In the setting of a potassium of 3.6, will provide him with 10 mEq of potassium daily. A followup BMET will be ordered.  3. Ongoing tobacco abuse: smoking cessation counseling was given to him for approximately 5 minutes discussing the need to stop immediately especially with multivessel coronary artery disease. He has cut down from 2 packs a day toe pack a day and I have strongly recommended that he stop altogether. He states that he wishes to quit and will continue to work on it.  4. Hypercholesterolemia:continue statin therapy.  Current medicines are reviewed at length with the patient today.    Disposition:   FU with one month.  Signed, Jory Sims, NP  03/17/2016 2:18 PM    [facility] [address] [place], [place] [address] Phone: [phone]; Fax: [phone]

## 2016-03-17 NOTE — Telephone Encounter (Signed)
Per Dr Donetta Potts arterial US right wrist,order placed   Pt en route for [place] now,will wait in radiology [place] until read.I asked him to stop by office and make fu apt

## 2016-03-18 ENCOUNTER — Other Ambulatory Visit: Payer: Self-pay

## 2016-03-18 LAB — BASIC METABOLIC PANEL
BUN: 16 mg/dL (ref 7–25)
CO2: 28 mmol/L (ref 20–31)
Calcium: 9.1 mg/dL (ref 8.6–10.3)
Chloride: 104 mmol/L (ref 98–110)
Creat: 0.99 mg/dL (ref 0.70–1.33)
Glucose, Bld: 163 mg/dL — ABNORMAL HIGH (ref 65–99)
POTASSIUM: 4.5 mmol/L (ref 3.5–5.3)
SODIUM: 141 mmol/L (ref 135–146)

## 2016-03-18 MED ORDER — GLUCOSE BLOOD VI STRP: ORAL_STRIP | 5 refills | Status: DC

## 2016-03-21 ENCOUNTER — Observation Stay (HOSPITAL_COMMUNITY)
Admission: EM | Admit: 2016-03-21 | Discharge: 2016-03-22 | Disposition: A | Discharge status: Home / Self Care | Payer: Medicaid Other | Attending: Cardiology | Admitting: Cardiology

## 2016-03-21 ENCOUNTER — Emergency Department (HOSPITAL_COMMUNITY): Payer: Medicaid Other

## 2016-03-21 ENCOUNTER — Encounter (HOSPITAL_COMMUNITY): Payer: Self-pay | Admitting: Emergency Medicine

## 2016-03-21 ENCOUNTER — Ambulatory Visit: Payer: Medicaid Other | Admitting: Diagnostic Neuroimaging

## 2016-03-21 DIAGNOSIS — M549 Dorsalgia, unspecified: Secondary | ICD-10-CM | POA: Insufficient documentation

## 2016-03-21 DIAGNOSIS — K5730 Diverticulosis of large intestine without perforation or abscess without bleeding: Secondary | ICD-10-CM | POA: Insufficient documentation

## 2016-03-21 DIAGNOSIS — Z8249 Family history of ischemic heart disease and other diseases of the circulatory system: Secondary | ICD-10-CM | POA: Insufficient documentation

## 2016-03-21 DIAGNOSIS — F319 Bipolar disorder, unspecified: Secondary | ICD-10-CM | POA: Diagnosis not present

## 2016-03-21 DIAGNOSIS — E785 Hyperlipidemia, unspecified: Secondary | ICD-10-CM | POA: Insufficient documentation

## 2016-03-21 DIAGNOSIS — Z7902 Long term (current) use of antithrombotics/antiplatelets: Secondary | ICD-10-CM | POA: Diagnosis not present

## 2016-03-21 DIAGNOSIS — F419 Anxiety disorder, unspecified: Secondary | ICD-10-CM | POA: Insufficient documentation

## 2016-03-21 DIAGNOSIS — E1140 Type 2 diabetes mellitus with diabetic neuropathy, unspecified: Secondary | ICD-10-CM | POA: Diagnosis not present

## 2016-03-21 DIAGNOSIS — I2510 Atherosclerotic heart disease of native coronary artery without angina pectoris: Secondary | ICD-10-CM | POA: Diagnosis present

## 2016-03-21 DIAGNOSIS — I10 Essential (primary) hypertension: Secondary | ICD-10-CM | POA: Diagnosis not present

## 2016-03-21 DIAGNOSIS — R072 Precordial pain: Secondary | ICD-10-CM | POA: Diagnosis present

## 2016-03-21 DIAGNOSIS — G8929 Other chronic pain: Secondary | ICD-10-CM | POA: Diagnosis not present

## 2016-03-21 DIAGNOSIS — R0789 Other chest pain: Secondary | ICD-10-CM | POA: Diagnosis not present

## 2016-03-21 DIAGNOSIS — F17210 Nicotine dependence, cigarettes, uncomplicated: Secondary | ICD-10-CM | POA: Insufficient documentation

## 2016-03-21 DIAGNOSIS — Z794 Long term (current) use of insulin: Secondary | ICD-10-CM | POA: Insufficient documentation

## 2016-03-21 DIAGNOSIS — R079 Chest pain, unspecified: Secondary | ICD-10-CM | POA: Diagnosis present

## 2016-03-21 DIAGNOSIS — G4733 Obstructive sleep apnea (adult) (pediatric): Secondary | ICD-10-CM | POA: Insufficient documentation

## 2016-03-21 DIAGNOSIS — Z716 Tobacco abuse counseling: Secondary | ICD-10-CM

## 2016-03-21 DIAGNOSIS — Z720 Tobacco use: Secondary | ICD-10-CM

## 2016-03-21 DIAGNOSIS — Z79899 Other long term (current) drug therapy: Secondary | ICD-10-CM | POA: Diagnosis not present

## 2016-03-21 DIAGNOSIS — I25118 Atherosclerotic heart disease of native coronary artery with other forms of angina pectoris: Secondary | ICD-10-CM | POA: Diagnosis not present

## 2016-03-21 DIAGNOSIS — I252 Old myocardial infarction: Secondary | ICD-10-CM | POA: Diagnosis not present

## 2016-03-21 DIAGNOSIS — R785 Finding of other psychotropic drug in blood: Secondary | ICD-10-CM | POA: Diagnosis not present

## 2016-03-21 DIAGNOSIS — E1165 Type 2 diabetes mellitus with hyperglycemia: Secondary | ICD-10-CM | POA: Diagnosis not present

## 2016-03-21 DIAGNOSIS — E1159 Type 2 diabetes mellitus with other circulatory complications: Secondary | ICD-10-CM | POA: Insufficient documentation

## 2016-03-21 HISTORY — DX: Chest pain, unspecified: R07.9

## 2016-03-21 HISTORY — DX: Procedure and treatment not carried out because of patient's decision for reasons of belief and group pressure: Z53.1

## 2016-03-21 HISTORY — DX: Atherosclerotic heart disease of native coronary artery without angina pectoris: I25.10

## 2016-03-21 LAB — CBC
HCT: 40.4 % (ref 39.0–52.0)
HEMOGLOBIN: 14 g/dL (ref 13.0–17.0)
MCH: 31.1 pg (ref 26.0–34.0)
MCHC: 34.7 g/dL (ref 30.0–36.0)
MCV: 89.8 fL (ref 78.0–100.0)
PLATELETS: 134 10*3/uL — AB (ref 150–400)
RBC: 4.5 MIL/uL (ref 4.22–5.81)
RDW: 12.6 % (ref 11.5–15.5)
WBC: 6 10*3/uL (ref 4.0–10.5)

## 2016-03-21 LAB — I-STAT TROPONIN, ED: TROPONIN I, POC: 0 ng/mL (ref 0.00–0.08)

## 2016-03-21 LAB — PROTIME-INR
INR: 1.08
Prothrombin Time: 14.1 seconds (ref 11.4–15.2)

## 2016-03-21 LAB — BASIC METABOLIC PANEL
ANION GAP: 7 (ref 5–15)
BUN: 16 mg/dL (ref 6–20)
CALCIUM: 9.2 mg/dL (ref 8.9–10.3)
CO2: 27 mmol/L (ref 22–32)
CREATININE: 1.03 mg/dL (ref 0.61–1.24)
Chloride: 104 mmol/L (ref 101–111)
GLUCOSE: 214 mg/dL — AB (ref 65–99)
Potassium: 4.1 mmol/L (ref 3.5–5.1)
Sodium: 138 mmol/L (ref 135–145)

## 2016-03-21 LAB — GLUCOSE, CAPILLARY
GLUCOSE-CAPILLARY: 218 mg/dL — AB (ref 65–99)
Glucose-Capillary: 148 mg/dL — ABNORMAL HIGH (ref 65–99)

## 2016-03-21 LAB — TROPONIN I

## 2016-03-21 MED ORDER — HEPARIN (PORCINE) IN NACL 100-0.45 UNIT/ML-% IJ SOLN
950.0000 [IU]/h | INTRAMUSCULAR | Status: DC
  Administered 2016-03-21: 800 [IU]/h via INTRAVENOUS
  Filled 2016-03-21: qty 250

## 2016-03-21 MED ORDER — DIVALPROEX SODIUM 500 MG PO DR TAB
1500.0000 mg | DELAYED_RELEASE_TABLET | Freq: Two times a day (BID) | ORAL | Status: DC
  Administered 2016-03-21 – 2016-03-22 (×2): 1500 mg via ORAL
  Filled 2016-03-21 (×3): qty 3

## 2016-03-21 MED ORDER — NITROGLYCERIN 0.4 MG SL SUBL
0.4000 mg | SUBLINGUAL_TABLET | SUBLINGUAL | Status: DC | PRN
  Administered 2016-03-21: 0.4 mg via SUBLINGUAL

## 2016-03-21 MED ORDER — ISOSORBIDE MONONITRATE ER 60 MG PO TB24
90.0000 mg | ORAL_TABLET | Freq: Every day | ORAL | Status: DC
  Administered 2016-03-22: 90 mg via ORAL
  Filled 2016-03-21: qty 1

## 2016-03-21 MED ORDER — HYDROCHLOROTHIAZIDE 25 MG PO TABS
25.0000 mg | ORAL_TABLET | Freq: Every day | ORAL | Status: DC
  Administered 2016-03-22: 25 mg via ORAL
  Filled 2016-03-21: qty 1

## 2016-03-21 MED ORDER — INSULIN ASPART 100 UNIT/ML ~~LOC~~ SOLN
0.0000 [IU] | Freq: Three times a day (TID) | SUBCUTANEOUS | Status: DC
  Administered 2016-03-22: 5 [IU] via SUBCUTANEOUS
  Administered 2016-03-22: 2 [IU] via SUBCUTANEOUS

## 2016-03-21 MED ORDER — HYDROCODONE-ACETAMINOPHEN 7.5-325 MG PO TABS: 1.0000 | ORAL_TABLET | Freq: Four times a day (QID) | ORAL | Status: DC | PRN

## 2016-03-21 MED ORDER — LURASIDONE HCL 40 MG PO TABS
120.0000 mg | ORAL_TABLET | Freq: Every day | ORAL | Status: DC
  Administered 2016-03-22: 120 mg via ORAL
  Filled 2016-03-21: qty 6
  Filled 2016-03-21: qty 3

## 2016-03-21 MED ORDER — AMLODIPINE BESYLATE 10 MG PO TABS
10.0000 mg | ORAL_TABLET | Freq: Every day | ORAL | Status: DC
  Administered 2016-03-22: 10 mg via ORAL
  Filled 2016-03-21: qty 1

## 2016-03-21 MED ORDER — NITROGLYCERIN 0.4 MG SL SUBL
SUBLINGUAL_TABLET | SUBLINGUAL | Status: AC
  Filled 2016-03-21: qty 1

## 2016-03-21 MED ORDER — ACETAMINOPHEN 325 MG PO TABS: 650.0000 mg | ORAL_TABLET | ORAL | Status: DC | PRN

## 2016-03-21 MED ORDER — HEPARIN BOLUS VIA INFUSION
4000.0000 [IU] | Freq: Once | INTRAVENOUS | Status: AC
  Administered 2016-03-21: 4000 [IU] via INTRAVENOUS
  Filled 2016-03-21: qty 4000

## 2016-03-21 MED ORDER — SODIUM CHLORIDE 0.9% FLUSH
3.0000 mL | Freq: Two times a day (BID) | INTRAVENOUS | Status: DC
  Administered 2016-03-21 – 2016-03-22 (×2): 3 mL via INTRAVENOUS

## 2016-03-21 MED ORDER — ALPRAZOLAM 0.5 MG PO TABS
1.0000 mg | ORAL_TABLET | Freq: Two times a day (BID) | ORAL | Status: DC
  Administered 2016-03-21 – 2016-03-22 (×2): 1 mg via ORAL
  Filled 2016-03-21 (×2): qty 2

## 2016-03-21 MED ORDER — ATENOLOL 25 MG PO TABS: 100.0000 mg | ORAL_TABLET | Freq: Every day | ORAL | Status: DC

## 2016-03-21 MED ORDER — POTASSIUM CHLORIDE CRYS ER 10 MEQ PO TBCR
10.0000 meq | EXTENDED_RELEASE_TABLET | Freq: Every day | ORAL | Status: DC
  Administered 2016-03-22: 10 meq via ORAL
  Filled 2016-03-21 (×2): qty 1

## 2016-03-21 MED ORDER — ONDANSETRON HCL 4 MG/2ML IJ SOLN: 4.0000 mg | Freq: Four times a day (QID) | INTRAMUSCULAR | Status: DC | PRN

## 2016-03-21 MED ORDER — FLUOXETINE HCL 20 MG PO CAPS
20.0000 mg | ORAL_CAPSULE | Freq: Every day | ORAL | Status: DC
  Administered 2016-03-22: 20 mg via ORAL
  Filled 2016-03-21: qty 1

## 2016-03-21 MED ORDER — NITROGLYCERIN 0.4 MG SL SUBL
SUBLINGUAL_TABLET | SUBLINGUAL | Status: AC
  Administered 2016-03-21: 0.4 mg via SUBLINGUAL
  Filled 2016-03-21: qty 1

## 2016-03-21 MED ORDER — LEVETIRACETAM 500 MG PO TABS
1500.0000 mg | ORAL_TABLET | Freq: Two times a day (BID) | ORAL | Status: DC
  Administered 2016-03-21 – 2016-03-22 (×2): 1500 mg via ORAL
  Filled 2016-03-21 (×2): qty 3

## 2016-03-21 MED ORDER — SODIUM CHLORIDE 0.9 % IV SOLN: 250.0000 mL | INTRAVENOUS | Status: DC | PRN

## 2016-03-21 MED ORDER — LISINOPRIL-HYDROCHLOROTHIAZIDE 20-25 MG PO TABS: 1.0000 | ORAL_TABLET | Freq: Every day | ORAL | Status: DC

## 2016-03-21 MED ORDER — LISINOPRIL 10 MG PO TABS
20.0000 mg | ORAL_TABLET | Freq: Every day | ORAL | Status: DC
Start: 2016-03-22 — End: 2016-03-23
  Administered 2016-03-22: 20 mg via ORAL
  Filled 2016-03-21: qty 2

## 2016-03-21 MED ORDER — SODIUM CHLORIDE 0.9% FLUSH: 3.0000 mL | INTRAVENOUS | Status: DC | PRN

## 2016-03-21 MED ORDER — ATORVASTATIN CALCIUM 40 MG PO TABS
40.0000 mg | ORAL_TABLET | Freq: Every day | ORAL | Status: DC
  Administered 2016-03-22: 40 mg via ORAL
  Filled 2016-03-21: qty 1

## 2016-03-21 NOTE — ED Triage Notes (Signed)
EMS - Patient coming from home with c/o of central chest pain with radiation to left arm.  Patient was diaphoretic on scene.  Patient took 1 Nitro and given 1 Nitro with pain going from 10/10 to 2/10.  EMS did not elevation in V2 on EKG prior to arrival.  Patient had "3 blockages last week and went for cathetarization, no stents placed.".

## 2016-03-21 NOTE — ED Provider Notes (Signed)
[facility] DEPT Provider Note   CSN: [id number] Arrival date & time: 03/21/16  1457     History   Chief Complaint Chief Complaint  Patient presents with  . Chest Pain    HPI Melvin Taylor is a 50 y.o. male.  Patient with hx cad, s/p recent cath, c/o mid chest pain radiating to left arm, onset earlier today. Pain was dull, moderate, persistent. No specific exacerbating or alleviating factors. No pleuritic pain. Feels same as recent/prior cardiac chest pain. +sob and diaphoresis. No nv. Cp improved w ntg. Denies cough or uri c/o. No fever or chills. No leg pain or swelling. No heartburn.    The history is provided by the patient.  Chest Pain   Pertinent negatives include no abdominal pain, no back pain, no fever, no headaches, no shortness of breath and no vomiting.    Past Medical History:  Diagnosis Date  . Anxiety   . Bipolar 1 disorder ([place])   . Chronic back pain   . Depression   . Diabetes mellitus    Type II  . Hallucinations    "long history of them"  . Headache(784.0)   . History of gout   . HTN (hypertension)   . Insomnia   . Neuropathy ([place])   . Pain management   . Schizophrenia ([place])   . Seizures ([place])    last sz between July 5-9th, 2016; epilepsy  . Shortness of breath dyspnea    with exertion  . Sleep apnea     Patient Active Problem List   Diagnosis Date Noted  . Hyperlipidemia 03/11/2016  . Ischemic chest pain ([place])   . NSTEMI (non-ST elevated myocardial infarction) ([place])   . Coronary atherosclerosis of native coronary artery 03/03/2016  . Cervical spinal stenosis 12/14/2015  . Loss of weight 08/25/2015  . Essential hypertension, benign 07/23/2015  . Personal history of noncompliance with medical treatment, presenting hazards to health 07/23/2015  . Abdominal pain 06/08/2015  . Constipation 06/08/2015  . History of colonic polyps   . Diverticulosis of colon without hemorrhage   . Mucosal abnormality of stomach   . Encounter for  screening colonoscopy 04/16/2015  . Dysphagia 04/16/2015  . Partial symptomatic epilepsy with complex partial seizures, intractable, without status epilepticus ([place]) 11/10/2014  . DM type 2 causing vascular disease ([place]) 06/29/2014  . Musculoskeletal pain 06/29/2014  . Hyperglycemia without ketosis 09/07/2013  . 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 Knee pain 12/28/2010  . Medial meniscus, posterior horn derangement 12/28/2010    Past Surgical History:  Procedure Laterality Date  . ANTERIOR CERVICAL DECOMP/DISCECTOMY FUSION N/A 12/14/2015   Procedure: ANTERIOR CERVICAL DECOMPRESSION/DISCECTOMY FUSION CERVICAL FIVE -SIX;  Surgeon: Earnie Larsson, MD;  Location: [facility];  Service: Neurosurgery;  Laterality: N/A;  . BIOPSY  05/07/2015   Procedure: BIOPSY (Gastric);  Surgeon: [name], MD;  Location: [facility];  Service: Endoscopy;;  . CARDIAC CATHETERIZATION N/A 03/07/2016   Procedure: Left Heart Cath and Coronary Angiography;  Surgeon: Belva Crome, MD;  Location: [facility];  Service: Cardiovascular;  Laterality: N/A;  . COLONOSCOPY WITH PROPOFOL N/A 05/07/2015   [id number] diverticulosis, multiple colon polyps removed, tubular adenoma, serrated colon polyp. Next colonoscopy October 2019  . ESOPHAGEAL DILATION N/A 05/07/2015   Procedure: ESOPHAGEAL DILATION WITH 56FR MALONEY DILATOR;  Surgeon: [name], MD;  Location: [facility];  Service: Endoscopy;  Laterality: N/A;  . ESOPHAGOGASTRODUODENOSCOPY (EGD) WITH PROPOFOL N/A 05/07/2015   RMR: Status post  dilation of normal esophagus. Gastritis.  Marland Kitchen KNEE SURGERY Left    arthroscopy  . MANDIBLE FRACTURE SURGERY    . POLYPECTOMY  05/07/2015   Procedure: POLYPECTOMY (Hepatic Flexure, Distal Transverse Colon, Rectal);  Surgeon: [name], MD;  Location: [facility];  Service: Endoscopy;;  . TONSILLECTOMY         Home Medications    Prior to Admission medications   Medication Sig Start Date End Date Taking?  Authorizing Provider  ALPRAZolam Duanne Moron) 1 MG tablet Take 1 mg by mouth 2 (two) times daily.    Historical Provider, MD  atenolol (TENORMIN) 100 MG tablet Take 100 mg by mouth daily.      Historical Provider, MD  atorvastatin (LIPITOR) 40 MG tablet Take 1 tablet (40 mg total) by mouth daily. 03/04/16 06/02/16  Herminio Commons, MD  cephALEXin (KEFLEX) 500 MG capsule Take 1 capsule (500 mg total) by mouth 2 (two) times daily. 03/17/16   Lendon Colonel, NP  clopidogrel (PLAVIX) 75 MG tablet Take 75 mg by mouth daily.    Historical Provider, MD  divalproex (DEPAKOTE) 500 MG DR tablet Take 3 tablets (1,500 mg total) by mouth 2 (two) times daily. 09/21/15   Penni Bombard, MD  glucose blood (ACCU-CHEK AVIVA) test strip Use to test 4 times a day. E11.65 03/18/16   Cassandria Anger, MD  HYDROcodone-acetaminophen (NORCO) 7.5-325 MG tablet Take 1 tablet by mouth every 6 (six) hours as needed for moderate pain.    Historical Provider, MD  insulin aspart (NOVOLOG) 100 UNIT/ML FlexPen Inject 5-11 Units into the skin 3 (three) times daily with meals. 03/11/16   Cassandria Anger, MD  Insulin Glargine (LANTUS SOLOSTAR [place]) Inject 30 Units into the skin at bedtime.    Historical Provider, MD  isosorbide mononitrate (IMDUR) 60 MG 24 hr tablet Take 1 tablet (60 mg total) by mouth daily. 03/07/16   Belva Crome, MD  levETIRAcetam (KEPPRA) 500 MG tablet Take 2 tablets (1,000 mg total) by mouth 2 (two) times daily. 09/21/15   Penni Bombard, MD  nitroGLYCERIN (NITROSTAT) 0.4 MG SL tablet Place 0.4 mg under the tongue every 5 (five) minutes as needed for chest pain.    Historical Provider, MD  potassium chloride (K-DUR) 10 MEQ tablet Take 1 tablet (10 mEq total) by mouth daily. 03/17/16 06/15/16  Lendon Colonel, NP    Family History Family History  Problem Relation Age of Onset  . Diabetes Father   . Hypertension Father   . Arthritis    . Diabetes    . Asthma    . Stroke Sister     Social  History Social History  Substance Use Topics  . Smoking status: Current Every Day Smoker    Packs/day: 1.00    Years: 30.00    Types: Cigarettes  . Smokeless tobacco: Never Used     Comment: 09/21/15 trying to quit, < 1 PPD  . Alcohol use Yes     Comment: 02/11/15 "drink a beer every now and then"     Allergies   Aspirin   Review of Systems Review of Systems  Constitutional: Negative for fever.  HENT: Negative for sore throat.   Eyes: Negative for redness.  Respiratory: Negative for shortness of breath.   Cardiovascular: Positive for chest pain. Negative for leg swelling.  Gastrointestinal: Negative for abdominal pain and vomiting.  Genitourinary: Negative for flank pain.  Musculoskeletal: Negative for back pain and neck pain.  Skin: Negative for  rash.  Neurological: Negative for headaches.  Hematological: Does not bruise/bleed easily.  Psychiatric/Behavioral: Negative for confusion.     Physical Exam Updated Vital Signs BP 158/89 (BP Location: Left Arm)   Pulse (!) 50   Temp 98.9 F (37.2 C) (Oral)   Resp 23   SpO2 98%   Physical Exam  Constitutional: He is oriented to person, place, and time. He appears well-developed and well-nourished. No distress.  HENT:  Head: Atraumatic.  Mouth/Throat: Oropharynx is clear and moist.  Eyes: Pupils are equal, round, and reactive to light.  Neck: Neck supple. No tracheal deviation present.  Cardiovascular: Normal rate, regular rhythm, normal heart sounds and intact distal pulses.  Exam reveals no gallop and no friction rub.   No murmur heard. Pulmonary/Chest: Effort normal and breath sounds normal. No accessory muscle usage. No respiratory distress. He exhibits no tenderness.  Abdominal: Soft. Bowel sounds are normal. He exhibits no distension. There is no tenderness.  Musculoskeletal: He exhibits no edema or tenderness.  Neurological: He is alert and oriented to person, place, and time.  Skin: Skin is warm and dry. He is  not diaphoretic.  Psychiatric: He has a normal mood and affect.  Nursing note and vitals reviewed.    ED Treatments / Results  Labs (all labs ordered are listed, but only abnormal results are displayed) Results for orders placed or performed during the hospital encounter of [id number]  Basic metabolic panel  Result Value Ref Range   Sodium 138 135 - 145 mmol/L   Potassium 4.1 3.5 - 5.1 mmol/L   Chloride 104 101 - 111 mmol/L   CO2 27 22 - 32 mmol/L   Glucose, Bld 214 (H) 65 - 99 mg/dL   BUN 16 6 - 20 mg/dL   Creatinine, Ser 1.03 0.61 - 1.24 mg/dL   Calcium 9.2 8.9 - 10.3 mg/dL   GFR calc non Af Amer >60 >60 mL/min   GFR calc Af Amer >60 >60 mL/min   Anion gap 7 5 - 15  CBC  Result Value Ref Range   WBC 6.0 4.0 - 10.5 K/uL   RBC 4.50 4.22 - 5.81 MIL/uL   Hemoglobin 14.0 13.0 - 17.0 g/dL   HCT 40.4 39.0 - 52.0 %   MCV 89.8 78.0 - 100.0 fL   MCH 31.1 26.0 - 34.0 pg   MCHC 34.7 30.0 - 36.0 g/dL   RDW 12.6 11.5 - 15.5 %   Platelets 134 (L) 150 - 400 K/uL  I-stat troponin, ED  Result Value Ref Range   Troponin i, poc 0.00 0.00 - 0.08 ng/mL   Comment 3              EKG  EKG Interpretation  Date/Time:  Monday March 21 2016 15:01:51 EDT Ventricular Rate:  52 PR Interval:    QRS Duration: 84 QT Interval:  445 QTC Calculation: 414 R Axis:   -62 Text Interpretation:  Sinus rhythm Left anterior fascicular block Nonspecific T abnormalities, inferior leads No significant change since last tracing Confirmed by Ashok Cordia  MD, Lennette Bihari ([id number]) on 03/21/2016 3:06:36 PM       Radiology No results found.  Procedures Procedures (including critical care time)  Medications Ordered in ED Medications - No data to display   Initial Impression / Assessment and Plan / ED Course  I have reviewed the triage vital signs and the nursing notes.  Pertinent labs & imaging results that were available during my care of the patient were reviewed by me  and considered in my medical decision  making (see chart for details).  Clinical Course    Iv ns. Continuous pulse ox and monitor. o2 [place].   Pt already had asa.  Is currently cp free.  Reviewed nursing notes and prior charts for additional history.   Reviewed recent cath report, significant multivessel cad, with rec for initial med management, and that is symptoms progressed, would consider referral for cabg.   Cardiology consulted for admission - they will see in ED.  Recheck, pt remains cp free. Initial trop neg.     Final Clinical Impressions(s) / ED Diagnoses   Final diagnoses:  None    New Prescriptions New Prescriptions   No medications on file     Lajean Saver, MD 03/21/16 1559

## 2016-03-21 NOTE — Progress Notes (Signed)
Pt transferred to 2w04 from ED. Pt c/o L arm pain 7/10. MD notified. Pt hooked up to tele monitor. Oriented to room. Call bell and phone within reach. Will continue to monitor.

## 2016-03-21 NOTE — Progress Notes (Signed)
ANTICOAGULATION CONSULT NOTE - Initial Consult  Pharmacy Consult for heparin  Indication: chest pain/ACS  Allergies  Allergen Reactions  . Aspirin Swelling    Patient Measurements:   Heparin Dosing Weight: 66kg  Vital Signs: Temp: 98 F (36.7 C) (08/21 1837) Temp Source: Oral (08/21 1837) BP: 151/104 (08/21 1837) Pulse Rate: 45 (08/21 1837)  Labs:  Recent Labs  03/21/16 1502  HGB 14.0  HCT 40.4  PLT 134*  CREATININE 1.03    Estimated Creatinine Clearance: 78.4 mL/min (by C-G formula based on SCr of 1.03 mg/dL).   Medical History: Past Medical History:  Diagnosis Date  . Anxiety   . Bipolar 1 disorder ([place])   . Chronic back pain   . Depression   . Diabetes mellitus    Type II  . Hallucinations    "long history of them"  . Headache(784.0)   . History of gout   . HTN (hypertension)   . Insomnia   . Neuropathy ([place])   . Pain management   . Schizophrenia ([place])   . Seizures ([place])    last sz between July 5-9th, 2016; epilepsy  . Shortness of breath dyspnea    with exertion  . Sleep apnea     Assessment: 72 YOM with hx of severe CAD, admitted with chest pain, pharmacy is consulted to start IV heparin for r/o ACS. Baseline hgb 14, pltc 134K, not on anticoagulation PTA  Goal of Therapy:  Heparin level 0.3-0.7 units/ml Monitor platelets by anticoagulation protocol: Yes   Plan:  - Heparin bolus 4000 units x 1 - Heparin infusion 800 units/hr - f/u heparin level at 0200 - daily heparin level and CBC  Maryanna Shape, PharmD, BCPS  Clinical Pharmacist  Pager: [phone]   03/21/2016,6:54 PM

## 2016-03-21 NOTE — H&P (Signed)
Patient ID: SHAYD BERGKAMP MRN: [id number], DOB/AGE: 1966/06/10   Admit date: 03/21/2016  Primary Physician: Rosita Fire, MD Primary Cardiologist: Dr. Bronson Ing Reason for admission: chest pain  Pt. Profile:  Melvin Taylor is a 50 y.o. male with a history of HTN, HLD, uncontrolled DMT2, OSA on CPAP, tobacco abuse and recently diagnosed multivessel CAD who presented to [facility] ED today with chest pain.   He was seen in the office by Dr. Bronson Ing on 03/04/16 for chest pain. His sx were concerning for ischemic heart disease and he was set up for heart cath on 03/07/16. This showed severe CAD with CTO of mRCA filing with L->R collaterals, severe diffuse mLAD 75% stenosis with tortuous segment, mod eccentric ostial LCx 65% stenosis, overall normal LV function, with upper LVEDP and EF 55%. Plan was to increase anti-anginal therapy and he was started on imdur 60mg  daily. Plan was for CABG if he still had symptoms on maximal medical therapy. Bypass was recommended given total occlusion of the RCA, and segmental tortuous mid LAD which would probably be better treated surgical therapy rather than stenting (Allergy to aspirin/tortuosity/calcification/long segment of disease). He was discharged home.  He did well until this AM when he had chest pain that eventually resided. He then had another episode this afternoon while at work. The chest pain was as if someone " was pounding on his chest" with radiation to his left arm and significant SOB and diaphoresis. The pain was so significant that it "brought it to his knees." No nausea. He took a SL NTG and EMS was called. He was given another SL NTG with complete resolution of his chest pain. He is now chest pain free. No dizziness or syncope. No LE edema, orthopnea, or PND. He does report compliance with all his medications as well as imdur. He has quit smoking.     Problem List  Past Medical History:  Diagnosis Date  . Anxiety   . Bipolar 1  disorder ([place])   . Chronic back pain   . Depression   . Diabetes mellitus    Type II  . Hallucinations    "long history of them"  . Headache(784.0)   . History of gout   . HTN (hypertension)   . Insomnia   . Neuropathy ([place])   . Pain management   . Schizophrenia ([place])   . Seizures ([place])    last sz between July 5-9th, 2016; epilepsy  . Shortness of breath dyspnea    with exertion  . Sleep apnea     Past Surgical History:  Procedure Laterality Date  . ANTERIOR CERVICAL DECOMP/DISCECTOMY FUSION N/A 12/14/2015   Procedure: ANTERIOR CERVICAL DECOMPRESSION/DISCECTOMY FUSION CERVICAL FIVE -SIX;  Surgeon: Earnie Larsson, MD;  Location: [name];  Service: Neurosurgery;  Laterality: N/A;  . BIOPSY  05/07/2015   Procedure: BIOPSY (Gastric);  Surgeon: [name], MD;  Location: [facility];  Service: Endoscopy;;  . CARDIAC CATHETERIZATION N/A 03/07/2016   Procedure: Left Heart Cath and Coronary Angiography;  Surgeon: Belva Crome, MD;  Location: [facility];  Service: Cardiovascular;  Laterality: N/A;  . COLONOSCOPY WITH PROPOFOL N/A 05/07/2015   [id number] diverticulosis, multiple colon polyps removed, tubular adenoma, serrated colon polyp. Next colonoscopy October 2019  . ESOPHAGEAL DILATION N/A 05/07/2015   Procedure: ESOPHAGEAL DILATION WITH 56FR MALONEY DILATOR;  Surgeon: [name], MD;  Location: [facility];  Service: Endoscopy;  Laterality: N/A;  . ESOPHAGOGASTRODUODENOSCOPY (EGD) WITH PROPOFOL N/A 05/07/2015  RMR: Status post dilation of normal esophagus. Gastritis.  Marland Kitchen KNEE SURGERY Left    arthroscopy  . MANDIBLE FRACTURE SURGERY    . POLYPECTOMY  05/07/2015   Procedure: POLYPECTOMY (Hepatic Flexure, Distal Transverse Colon, Rectal);  Surgeon: [name], MD;  Location: [facility];  Service: Endoscopy;;  . TONSILLECTOMY       Allergies  Allergies  Allergen Reactions  . Aspirin Swelling     Home Medications  Prior to Admission medications   Medication Sig Start  Date End Date Taking? Authorizing Provider  ALPRAZolam Duanne Moron) 1 MG tablet Take 1 mg by mouth 2 (two) times daily.   Yes Historical Provider, MD  amLODipine (NORVASC) 10 MG tablet Take 10 mg by mouth daily.   Yes Historical Provider, MD  atenolol (TENORMIN) 100 MG tablet Take 100 mg by mouth daily.     Yes Historical Provider, MD  atorvastatin (LIPITOR) 40 MG tablet Take 1 tablet (40 mg total) by mouth daily. 03/04/16 06/02/16 Yes Herminio Commons, MD  cephALEXin (KEFLEX) 500 MG capsule Take 1 capsule (500 mg total) by mouth 2 (two) times daily. 03/17/16  Yes Lendon Colonel, NP  clopidogrel (PLAVIX) 75 MG tablet Take 75 mg by mouth daily.   Yes Historical Provider, MD  divalproex (DEPAKOTE) 500 MG DR tablet Take 3 tablets (1,500 mg total) by mouth 2 (two) times daily. 09/21/15  Yes Penni Bombard, MD  FLUoxetine (PROZAC) 20 MG capsule Take 20 mg by mouth daily.   Yes Historical Provider, MD  glucose blood (ACCU-CHEK AVIVA) test strip Use to test 4 times a day. E11.65 03/18/16  Yes Cassandria Anger, MD  HYDROcodone-acetaminophen (NORCO) 7.5-325 MG tablet Take 1 tablet by mouth every 6 (six) hours as needed for moderate pain.   Yes Historical Provider, MD  insulin aspart (NOVOLOG) 100 UNIT/ML FlexPen Inject 5-11 Units into the skin 3 (three) times daily with meals. 03/11/16  Yes Cassandria Anger, MD  Insulin Glargine (LANTUS SOLOSTAR [place]) Inject 30 Units into the skin at bedtime.   Yes Historical Provider, MD  isosorbide mononitrate (IMDUR) 60 MG 24 hr tablet Take 1 tablet (60 mg total) by mouth daily. 03/07/16  Yes Belva Crome, MD  levETIRAcetam (KEPPRA) 500 MG tablet Take 2 tablets (1,000 mg total) by mouth 2 (two) times daily. Patient taking differently: Take 1,500 mg by mouth 2 (two) times daily.  09/21/15  Yes Penni Bombard, MD  lisinopril-hydrochlorothiazide (PRINZIDE,ZESTORETIC) 20-25 MG tablet Take 1 tablet by mouth daily.   Yes Historical Provider, MD  Lurasidone HCl (LATUDA)  60 MG TABS Take 60 mg by mouth 2 (two) times daily.   Yes Historical Provider, MD  nitroGLYCERIN (NITROSTAT) 0.4 MG SL tablet Place 0.4 mg under the tongue every 5 (five) minutes as needed for chest pain.   Yes Historical Provider, MD  potassium chloride (K-DUR) 10 MEQ tablet Take 1 tablet (10 mEq total) by mouth daily. 03/17/16 06/15/16 Yes Lendon Colonel, NP    Family History  Family History  Problem Relation Age of Onset  . Diabetes Father   . Hypertension Father   . Arthritis    . Diabetes    . Asthma    . Stroke Sister    Family Status  Relation Status  . Father Alive  . Mother Alive  .    Marland Kitchen Sister      Social History  Social History   Social History  . Marital status: Single    Spouse  name: N/A  . Number of children: N/A  . Years of education: 11th grade   Occupational History  . unemployed Not Employed   Social History Main Topics  . Smoking status: Current Every Day Smoker    Packs/day: 1.00    Years: 30.00    Types: Cigarettes  . Smokeless tobacco: Never Used     Comment: 09/21/15 trying to quit, < 1 PPD  . Alcohol use Yes     Comment: 02/11/15 "drink a beer every now and then"  . Drug use: No     Comment: hx of cocaine use but denies using in over a year, denies marjuana use 01/18/15 and 02/11/15  . Sexual activity: Not Currently   Other Topics Concern  . Not on file   Social History Narrative   Lives alone   Drinks a cup of coffee a day     Review of Systems General:  No chills, fever, night sweats or weight changes.  Cardiovascular:  ++ chest pain, ++ dyspnea on exertion, NO edema, orthopnea, palpitations, paroxysmal nocturnal dyspnea. Dermatological: No rash, lesions/masses Respiratory: No cough, dyspnea Urologic: No hematuria, dysuria Abdominal:   No nausea, vomiting, diarrhea, bright red blood per rectum, melena, or hematemesis Neurologic:  No visual changes, wkns, changes in mental status. All other systems reviewed and are otherwise  negative except as noted above.  Physical Exam  Blood pressure 158/89, pulse (!) 50, temperature 98.9 F (37.2 C), temperature source Oral, resp. rate 23, SpO2 98 %.  General: Pleasant, NAD Psych: Normal affect. Neuro: Alert and oriented X 3. Moves all extremities spontaneously. HEENT: Normal  Neck: Supple without bruits or JVD. Lungs:  Resp regular and unlabored, CTA. Heart: RRR no s3, s4, or murmurs. Abdomen: Soft, non-tender, non-distended, BS + x 4.  Extremities: No clubbing, cyanosis or edema. DP/PT/Radials 2+ and equal bilaterally.  Labs  No results for input(s): CKTOTAL, CKMB, TROPONINI in the last 72 hours. Lab Results  Component Value Date   WBC 6.0 03/21/2016   HGB 14.0 03/21/2016   HCT 40.4 03/21/2016   MCV 89.8 03/21/2016   PLT 134 (L) 03/21/2016    Recent Labs Lab 03/21/16 1502  NA 138  K 4.1  CL 104  CO2 27  BUN 16  CREATININE 1.03  CALCIUM 9.2  GLUCOSE 214*    Lab Results  Component Value Date   DDIMER 0.32 06/29/2014     Radiology/Studies  Dg Chest 2 View  Result Date: 03/21/2016 CLINICAL DATA:  Two weeks of chest pain, shortness of breath, left shoulder pain; history of coronary artery disease with 2 previous MIs, heavy smoking history, diabetes EXAM: CHEST  2 VIEW COMPARISON:  PA and lateral chest x-ray of March 04, 2016 FINDINGS: The lungs are adequately inflated. There is no focal infiltrate. There is a 2 mm diameter projecting over the lateral aspect of the left 6 rib. This is not been previously demonstrated. The heart and pulmonary vascularity are normal. The mediastinum is normal in width. There is no pleural effusion. There is calcification in the wall of the aortic arch. The patient has undergone previous lower anterior cervical fusion. The thoracic vertebral bodies are preserved in height. IMPRESSION: There is no pneumonia nor CHF. **An incidental finding of potential clinical significance has been found. New 2 mm nodule peripherally in the  left upper lobe overlying the lateral aspect of the 6th rib. ** Given the patient's long history of smoking, chest CT scanning is recommended for further evaluation of this  nodule. These results will be called to the ordering clinician or representative by the Radiologist Assistant, and communication documented in the PACS or zVision Dashboard. Electronically Signed   By: David  Martinique M.D.   On: 03/21/2016 15:49   Dg Chest 2 View  Result Date: 03/04/2016 CLINICAL DATA:  Hypertension, preop for cardiac catheterization. EXAM: CHEST  2 VIEW COMPARISON:  Radiograph of February 23, 2016. FINDINGS: The heart size and mediastinal contours are within normal limits. Both lungs are clear. No pneumothorax or pleural effusion is noted. The visualized skeletal structures are unremarkable. IMPRESSION: No active cardiopulmonary disease. Electronically Signed   By: Marijo Conception, M.D.   On: 03/04/2016 15:19   [place] Upper Ext Art Right Ltd  Result Date: 03/17/2016 CLINICAL DATA:  50 year old male with a history of prior cardiac catheterization and concern for potential pseudoaneurysm EXAM: RIGHT UPPER EXTREMITY ARTERIAL DUPLEX SCAN TECHNIQUE: Gray-scale sonography as well as color Doppler and duplex ultrasound was performed to evaluate the arteries of the upper extremity. COMPARISON:  None. FINDINGS: Directed duplex of the right upper extremity performed. Hypodense soft tissue/infiltration overlying the radial artery. No pseudoaneurysm identified. Triphasic waveform of the radial artery which remains patent. IMPRESSION: Edema/ ecchymosis overlying the right radial artery puncture site with no evidence of pseudoaneurysm. Unremarkable radial artery waveform. Signed, Dulcy Fanny. Earleen Newport, DO [facility] Electronically Signed   By: Corrie Mckusick D.O.   On: 03/17/2016 14:16    2D ECHO: 03/09/2016 LV EF: 60% -   65% Study Conclusions - Left ventricle: The cavity size was  normal. Wall thickness was   increased in a pattern of moderate LVH. Systolic function was   normal. The estimated ejection fraction was in the range of 60%   to 65%. Wall motion was normal; there were no regional wall   motion abnormalities. Left ventricular diastolic function   parameters were normal. - Aortic valve: Valve area (VTI): 3.35 cm^2. Valve area (Vmax): 3.5   cm^2. Valve area (Vmean): 3.23 cm^2. - Mitral valve: Mildly calcified annulus. - Atrial septum: No defect or patent foramen ovale was identified. - Technically adequate study.   Procedures 03/07/16 Left Heart Cath and Coronary Angiography  Conclusion   Ost Cx lesion, 65 %stenosed.  Mid RCA lesion, 100 %stenosed.  Dist RCA lesion, 100 %stenosed.  1st Diag lesion, 70 %stenosed.  1st Sept lesion, 95 %stenosed.  Mid LAD to Dist LAD lesion, 75 %stenosed.  The left ventricular ejection fraction is 50-55% by visual estimate.  The left ventricular systolic function is normal.  LV end diastolic pressure is mildly elevated.   Severe coronary artery disease with total occlusion of the mid RCA filling left-to-right by collaterals. Severe diffuse mid LAD 75% stenosis within a calcified tortuous segment. Moderate eccentric ostial circumflex 65% stenosis.  Overall normal left ventricular function, with upper normal LVEDP, and estimated ejection fraction of 55%.  RECOMMENDATIONS:  Further up-titrate anti-anginal therapy by adding Imdur 60 mg per day.  Aggressive risk factor modification discussed with the patient including better diabetes control and smoking cessation.  If all maximal medical therapy there are still limiting symptoms, I'll recommend consideration of bypass surgery given total occlusion of the RCA, and segmental tortuous mid LAD which would probably be better treated surgical therapy rather than stenting (Allergy to aspirin/tortuosity/calcification/long segment of disease).      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 male with a history of HTN, HLD, uncontrolled DMT2, OSA on CPAP, tobacco abuse and recently diagnosed multivessel CAD who presented to [facility] ED today with chest pain.   CAD/Chest pain concerning for unstable angina: POC troponin neg x1.  ECG with no acute ST or TW changes. Will plan to keep him overnight for observation and chest pain rule out. He has known severe CAD. He continues to have chest pain despite compliance with medications. We cannot titrate BB due to bradycardia. Will increase imdur to 90mg  as he has room in his BP. Will start heparin per pharmacy. Will stop plavix and get CT surgery consult in the AM. Cont ASA, statin and BB. Of note, he has a history of an allergy to Aspirin  HTN: BP with moderate control. Continue current meds and increase imdur as above  HLD: no recent lipid panel. Will obtain fasting lipid panel tomorrow am   Uncontrolled DMT2: HG A1c 10.3. Will start SSI   Tobacco abuse: he has quit.   Signed, Angelena Form, PA-C 03/21/2016, 5:01 PM  Pager [phone]

## 2016-03-22 ENCOUNTER — Encounter (HOSPITAL_COMMUNITY): Payer: Self-pay | Admitting: General Practice

## 2016-03-22 ENCOUNTER — Encounter: Payer: Self-pay | Admitting: Diagnostic Neuroimaging

## 2016-03-22 ENCOUNTER — Other Ambulatory Visit: Payer: Self-pay | Admitting: *Deleted

## 2016-03-22 DIAGNOSIS — I10 Essential (primary) hypertension: Secondary | ICD-10-CM | POA: Diagnosis not present

## 2016-03-22 DIAGNOSIS — Z716 Tobacco abuse counseling: Secondary | ICD-10-CM | POA: Diagnosis not present

## 2016-03-22 DIAGNOSIS — I2510 Atherosclerotic heart disease of native coronary artery without angina pectoris: Secondary | ICD-10-CM | POA: Diagnosis not present

## 2016-03-22 DIAGNOSIS — R0789 Other chest pain: Secondary | ICD-10-CM | POA: Diagnosis not present

## 2016-03-22 DIAGNOSIS — E785 Hyperlipidemia, unspecified: Secondary | ICD-10-CM | POA: Diagnosis not present

## 2016-03-22 DIAGNOSIS — I25118 Atherosclerotic heart disease of native coronary artery with other forms of angina pectoris: Secondary | ICD-10-CM | POA: Diagnosis not present

## 2016-03-22 DIAGNOSIS — R785 Finding of other psychotropic drug in blood: Secondary | ICD-10-CM | POA: Diagnosis not present

## 2016-03-22 DIAGNOSIS — I25110 Atherosclerotic heart disease of native coronary artery with unstable angina pectoris: Secondary | ICD-10-CM | POA: Diagnosis not present

## 2016-03-22 LAB — CBC
HCT: 42.1 % (ref 39.0–52.0)
HEMOGLOBIN: 14.8 g/dL (ref 13.0–17.0)
MCH: 31.6 pg (ref 26.0–34.0)
MCHC: 35.2 g/dL (ref 30.0–36.0)
MCV: 90 fL (ref 78.0–100.0)
Platelets: 120 10*3/uL — ABNORMAL LOW (ref 150–400)
RBC: 4.68 MIL/uL (ref 4.22–5.81)
RDW: 12.6 % (ref 11.5–15.5)
WBC: 4.9 10*3/uL (ref 4.0–10.5)

## 2016-03-22 LAB — TROPONIN I
Troponin I: <0.03 ng/mL (ref <0.03)
Troponin I: <0.03 ng/mL (ref <0.03)

## 2016-03-22 LAB — HEPARIN LEVEL (UNFRACTIONATED)
HEPARIN UNFRACTIONATED: 0.34 [IU]/mL (ref 0.30–0.70)
Heparin Unfractionated: 0.26 IU/mL — ABNORMAL LOW (ref 0.30–0.70)

## 2016-03-22 LAB — GLUCOSE, CAPILLARY
GLUCOSE-CAPILLARY: 147 mg/dL — AB (ref 65–99)
GLUCOSE-CAPILLARY: 242 mg/dL — AB (ref 65–99)
Glucose-Capillary: 99 mg/dL (ref 65–99)

## 2016-03-22 LAB — COMPREHENSIVE METABOLIC PANEL
ALBUMIN: 3.7 g/dL (ref 3.5–5.0)
ALT: 14 U/L — AB (ref 17–63)
AST: 15 U/L (ref 15–41)
Alkaline Phosphatase: 37 U/L — ABNORMAL LOW (ref 38–126)
Anion gap: 6 (ref 5–15)
BILIRUBIN TOTAL: 1.1 mg/dL (ref 0.3–1.2)
BUN: 13 mg/dL (ref 6–20)
CHLORIDE: 103 mmol/L (ref 101–111)
CO2: 31 mmol/L (ref 22–32)
CREATININE: 0.94 mg/dL (ref 0.61–1.24)
Calcium: 9.3 mg/dL (ref 8.9–10.3)
GFR calc Af Amer: >60 mL/min (ref >60)
GLUCOSE: 74 mg/dL (ref 65–99)
POTASSIUM: 4 mmol/L (ref 3.5–5.1)
Sodium: 140 mmol/L (ref 135–145)
Total Protein: 5.8 g/dL — ABNORMAL LOW (ref 6.5–8.1)

## 2016-03-22 LAB — LIPID PANEL
CHOL/HDL RATIO: 2 ratio
Cholesterol: 120 mg/dL (ref 0–200)
HDL: 60 mg/dL (ref >40)
LDL CALC: 53 mg/dL (ref 0–99)
TRIGLYCERIDES: 37 mg/dL (ref <150)
VLDL: 7 mg/dL (ref 0–40)

## 2016-03-22 LAB — PROTIME-INR
INR: 1.11
Prothrombin Time: 14.3 seconds (ref 11.4–15.2)

## 2016-03-22 MED ORDER — RANOLAZINE ER 500 MG PO TB12: 500.0000 mg | ORAL_TABLET | Freq: Two times a day (BID) | ORAL | 6 refills | Status: DC

## 2016-03-22 MED ORDER — ISOSORBIDE MONONITRATE ER 60 MG PO TB24: 90.0000 mg | ORAL_TABLET | Freq: Every day | ORAL | 5 refills | Status: DC

## 2016-03-22 NOTE — Progress Notes (Signed)
Inpatient Diabetes Program Recommendations  AACE/ADA: New Consensus Statement on Inpatient Glycemic Control (2015)  Target Ranges:  Prepandial:   less than 140 mg/dL      Peak postprandial:   less than 180 mg/dL (1-2 hours)      Critically ill patients:  140 - 180 mg/dL   Review of Glycemic Control  Inpatient Diabetes Program Recommendations:   Spoke with pt about last A1C results and explained what an A1C is, basic pathophysiology of DM Type 2, basic home care, basic diabetes diet nutrition principles, importance of checking CBGs and maintaining good CBG control to prevent long-term and short-term complications. Reviewed signs and symptoms of hyperglycemia and hypoglycemia and how to treat hypoglycemia at home. Also reviewed blood sugar goals at home.    Thank you, Nani Gasser. Edie Vallandingham, RN, MSN, CDE Inpatient Glycemic Control Team Team Pager [phone] (8am-5pm) 03/22/2016 1:25 PM

## 2016-03-22 NOTE — Discharge Summary (Signed)
Discharge Summary    Patient ID: Melvin Taylor,  MRN: [id number], DOB/AGE: 03-31-1966 50 y.o.  Admit date: 03/21/2016 Discharge date: 03/22/2016  Primary Care Provider: [facility] Primary Cardiologist: Dr. Bronson Ing   Discharge Diagnoses    Active Problems:   Chest pain   Essential hypertension, benign   Coronary atherosclerosis of native coronary artery   Allergies Allergies  Allergen Reactions  . Aspirin Swelling    Diagnostic Studies/Procedures    None _____________   History of Present Illness     Melvin Taylor is a 50 y.o. male with a history of HTN, HLD, uncontrolled DMT2, OSA on CPAP, tobacco abuse and recently diagnosed multivessel CAD who presented to [facility] ED today with chest pain.   He was seen in the office by Dr. Bronson Ing on 03/04/16 for chest pain. His sx were concerning for ischemic heart disease and he was set up for heart cath on 03/07/16. This showed severe CAD with CTO of mRCA filing with L->R collaterals, severe diffuse mLAD 75% stenosis with tortuous segment, mod eccentric ostial LCx 65% stenosis, overall normal LV function, with upper LVEDP and EF 55%. Plan was to increase anti-anginal therapy and he was started on imdur 60mg  daily. Plan was for CABG if he still had symptoms on maximal medical therapy. Bypass was recommended given total occlusion of the RCA, and segmental tortuous mid LAD which would probably be better treated surgical therapy rather than stenting (Allergy to aspirin/tortuosity/calcification/long segment of disease). He was discharged home.  He did well until 03/21/2016 when he had chest pain that eventually resided. He then had another episode that afternoon while at work. The chest pain was as if someone " was pounding on his chest" with radiation to his left arm and significant SOB and diaphoresis. The pain was so significant that it "brought it to his knees." No nausea. He took a SL NTG and EMS was called. He was given another  SL NTG with complete resolution of his chest pain. He is now chest pain free. No dizziness or syncope. No LE edema, orthopnea, or PND. He does report compliance with all his medications as well as imdur. He has quit smoking.   Hospital Course     Consultants: CVTS  He was admitted for concerns of possible unstable angina, and started on IV heparin. His Imdur was increased to 90. His Plavix was stopped in order to allow for possible CABG, and CT surgery was consulted the following morning. His enzymes were cycled and he ruled out. He denied having any further chest pain during his admission. Seen by Dr. Cyndia Bent from [facility] who agreed that CABG was warranted. The plan will be for him to hold his Plavix with scheduled CABG this coming Monday.  He was seen and assessed by Dr. Ellyn Hack on 03/22/2016 and determined stable for discharge home. He was able to ambulate without difficulty. We will also add Ranexa 500 mg twice a day for antianginal effect until he is able to return for CABG. He was given ER precautions, and told to return if he should experience significant exacerbation of symptoms. CT surgery will provide his scheduling and arrange for his preoperative evaluations and outpatient setting. Will need to forward note to Dr. Tamala Julian.  _____________  Discharge Vitals Blood pressure 116/79, pulse (!) 54, temperature 97.5 F (36.4 C), temperature source Oral, resp. rate 20, weight 150 lb 6.4 oz (68.2 kg), SpO2 100 %.  Filed Weights   03/22/16 0627  Weight:  150 lb 6.4 oz (68.2 kg)    Labs & Radiologic Studies    CBC  Recent Labs  03/21/16 1502 03/22/16 0853  WBC 6.0 4.9  HGB 14.0 14.8  HCT 40.4 42.1  MCV 89.8 90.0  PLT 134* [id number]*   Basic Metabolic Panel  Recent Labs  03/21/16 1502 03/22/16 0853  NA 138 140  K 4.1 4.0  CL 104 103  CO2 27 31  GLUCOSE 214* 74  BUN 16 13  CREATININE 1.03 0.94  CALCIUM 9.2 9.3   Liver Function Tests  Recent Labs  03/22/16 0853  AST 15  ALT  14*  ALKPHOS 37*  BILITOT 1.1  PROT 5.8*  ALBUMIN 3.7   No results for input(s): LIPASE, AMYLASE in the last 72 hours. Cardiac Enzymes  Recent Labs  03/21/16 1910 03/22/16 0033 03/22/16 0853  TROPONINI <0.03 <0.03 <0.03   BNP Invalid input(s): POCBNP D-Dimer No results for input(s): DDIMER in the last 72 hours. Hemoglobin A1C No results for input(s): HGBA1C in the last 72 hours. Fasting Lipid Panel  Recent Labs  03/22/16 0853  CHOL 120  HDL 60  LDLCALC 53  TRIG 37  CHOLHDL 2.0   Thyroid Function Tests No results for input(s): TSH, T4TOTAL, T3FREE, THYROIDAB in the last 72 hours.  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 There is no focal infiltrate. There is a 2 mm diameter projecting over the lateral aspect of the left 6 rib. This is not been previously demonstrated. The heart and pulmonary vascularity are normal. The mediastinum is normal in width. There is no pleural effusion. There is calcification in the wall of the aortic arch. The patient has undergone previous lower anterior cervical fusion. The thoracic vertebral bodies are preserved in height. IMPRESSION: There is no pneumonia nor CHF. **An incidental finding of potential clinical significance has been found. New 2 mm nodule peripherally in the left upper lobe overlying the lateral aspect of the 6th rib. ** Given the patient's long history of smoking, chest CT scanning is recommended for further evaluation of this nodule. These results will be called to the ordering clinician or representative by the Radiologist Assistant, and communication documented in the PACS or zVision Dashboard.  Electronically Signed   By: David  Martinique M.D.   On: 03/21/2016 15:49   Dg Chest 2 View  Result Date: 03/04/2016 CLINICAL DATA:  Hypertension, preop for cardiac catheterization. EXAM: CHEST  2 VIEW COMPARISON:  Radiograph of February 23, 2016. FINDINGS: The heart size and mediastinal contours are within normal limits. Both lungs are clear. No pneumothorax or pleural effusion is noted. The visualized skeletal structures are unremarkable. IMPRESSION: No active cardiopulmonary disease. Electronically Signed   By: Marijo Conception, M.D.   On: 03/04/2016 15:19   [place] Upper Ext Art Right Ltd  Result Date: 03/17/2016 CLINICAL DATA:  50 year old male with a history of prior cardiac catheterization and concern for potential pseudoaneurysm EXAM: RIGHT UPPER EXTREMITY ARTERIAL DUPLEX SCAN TECHNIQUE: Gray-scale sonography as well as color Doppler and duplex ultrasound was performed to evaluate the arteries of the upper extremity. COMPARISON:  None. FINDINGS: Directed duplex of the right upper extremity performed. Hypodense soft tissue/infiltration overlying  the radial artery. No pseudoaneurysm identified. Triphasic waveform of the radial artery which remains patent. IMPRESSION: Edema/ ecchymosis overlying the right radial artery puncture site with no evidence of pseudoaneurysm. Unremarkable radial artery waveform. Signed, Dulcy Fanny. Earleen Newport, DO [facility] Electronically Signed   By: Corrie Mckusick D.O.   On: 03/17/2016 14:16   Disposition   Pt is being discharged home today in good condition.  Follow-up Plans & Appointments    Follow-up Information    Cardiothoracic Surgery .   Why:  CT surgery will call you with the information regarding your pre op precedures. Please remember to not take your plavix.          Discharge Instructions    Diet - low sodium heart healthy    Complete by:  As directed   Discharge instructions    Complete by:  As directed    Hold your Plavix, CT surgery will call you with your follow up instructions.   Increase activity slowly    Complete by:  As directed      Discharge Medications   Current Discharge Medication List    START taking these medications   Details  ranolazine (RANEXA) 500 MG 12 hr tablet Take 1 tablet (500 mg total) by mouth 2 (two) times daily. Qty: 60 tablet, Refills: 6      CONTINUE these medications which have CHANGED   Details  isosorbide mononitrate (IMDUR) 60 MG 24 hr tablet Take 1.5 tablets (90 mg total) by mouth daily. Qty: 30 tablet, Refills: 5      CONTINUE these medications which have NOT CHANGED   Details  ALPRAZolam (XANAX) 1 MG tablet Take 1 mg by mouth 2 (two) times daily.    amLODipine (NORVASC) 10 MG tablet Take 10 mg by mouth daily.    atenolol (TENORMIN) 100 MG tablet Take 100 mg by mouth daily.      atorvastatin (LIPITOR) 40 MG tablet Take 1 tablet (40 mg total) by mouth daily. Qty: 90 tablet, Refills: 0    cephALEXin (KEFLEX) 500 MG capsule Take 1 capsule (500 mg total) by mouth 2 (two) times daily. Qty: 14 capsule, Refills: 0    divalproex (DEPAKOTE) 500 MG DR tablet Take 3 tablets (1,500 mg total) by mouth 2 (two) times daily. Qty: 540 tablet, Refills: 4    FLUoxetine (PROZAC) 20 MG capsule Take 20 mg by mouth daily.    glucose blood (ACCU-CHEK AVIVA) test strip Use to test 4 times a day. E11.65 Qty: 150 each, Refills: 5    HYDROcodone-acetaminophen (NORCO) 7.5-325 MG tablet Take 1 tablet by mouth every 6 (six) hours as needed for moderate pain.    insulin aspart (NOVOLOG) 100 UNIT/ML FlexPen Inject 5-11 Units into the skin 3 (three) times daily with meals. Qty: 5 pen, Refills: 2    Insulin Glargine (LANTUS SOLOSTAR [place]) Inject 30 Units into the skin at bedtime.    levETIRAcetam (KEPPRA) 500 MG tablet Take 2 tablets (1,000 mg total) by mouth 2 (two) times daily. Qty: 360 tablet, Refills: 4    lisinopril-hydrochlorothiazide (PRINZIDE,ZESTORETIC)  20-25 MG tablet Take 1 tablet by mouth daily.    Lurasidone HCl (LATUDA) 60 MG TABS Take 120 mg by mouth daily.     nitroGLYCERIN (NITROSTAT) 0.4 MG SL tablet Place 0.4 mg under the tongue every 5 (five) minutes as needed for chest pain.    potassium chloride (K-DUR) 10 MEQ tablet Take 1 tablet (10 mEq total) by mouth daily. Qty:  90 tablet, Refills: 1      STOP taking these medications     clopidogrel (PLAVIX) 75 MG tablet           Outstanding Labs/Studies   None  Duration of Discharge Encounter   Greater than 30 minutes including physician time.  Signed, Reino Bellis NP-C 03/22/2016, 6:54 PM

## 2016-03-22 NOTE — Consult Note (Signed)
[address]       [place] [address]             [phone]      Cardiothoracic Surgery Consultation   Reason for Consult: Severe multi-vessel coronary artery disease Referring Physician: Dr. Virgel Gess Heggie is an 50 y.o. male.  HPI:   The patient is a 50 year old gentleman with poorly controlled DM, hypertension, hyperlipidemia, a history of smoking until recently, a family history of CAD and bipolar disorder with schizophrenia who presented earlier this month with chest pain. This reportedly has been occurring for several months with exertion such as walking long distances and lifting heavy objects. He was reportedly admitted to [facility] with a positive troponin and started on Plavix and Atenolol. He was seen by Dr. Jacinta Shoe and underwent cath on 8/7 by Dr. Tamala Julian which showed severe multi-vessel coronary disease with total occlusion of the mid RCA filling by left to right collaterals, diffuse 75% mid LAD stenosis and 65% + ostial LCX stenosis with an EF of 55%. He was started on medical treatment with Imdur added to his other meds. He was admitted yesterday with recurrent severe chest pain with exertion. This was associated with radiation to the left arm and SOB, diaphoresis. ECG was unremarkable and troponin has been negative x 3. Chest pain resolved after two SL NTG.  Past Medical History:  Diagnosis Date  . Anxiety   . Bipolar 1 disorder ([place])   . Chest pain   . Chronic back pain   . Coronary artery disease   . Depression   . Diabetes mellitus    Type II  . Hallucinations    "long history of them"  . Headache(784.0)   . History of gout   . HTN (hypertension)   . Insomnia   . Neuropathy ([place])   . Pain management   . Schizophrenia ([place])   . Seizures ([place])    last sz between July 5-9th, 2016; epilepsy  . Shortness of breath dyspnea    with exertion  . Sleep apnea   . Transfusion of blood product refused for religious reason       Past Surgical History:  Procedure Laterality Date  . ANTERIOR CERVICAL DECOMP/DISCECTOMY FUSION N/A 12/14/2015   Procedure: ANTERIOR CERVICAL DECOMPRESSION/DISCECTOMY FUSION CERVICAL FIVE -SIX;  Surgeon: Earnie Larsson, MD;  Location: [facility];  Service: Neurosurgery;  Laterality: N/A;  . BIOPSY  05/07/2015   Procedure: BIOPSY (Gastric);  Surgeon: [name], MD;  Location: [facility];  Service: Endoscopy;;  . CARDIAC CATHETERIZATION N/A 03/07/2016   Procedure: Left Heart Cath and Coronary Angiography;  Surgeon: Belva Crome, MD;  Location: [facility] CV LAB;  Service: Cardiovascular;  Laterality: N/A;  . COLONOSCOPY WITH PROPOFOL N/A 05/07/2015   UEA:[id number] diverticulosis, multiple colon polyps removed, tubular adenoma, serrated colon polyp. Next colonoscopy October 2019  . ESOPHAGEAL DILATION N/A 05/07/2015   Procedure: ESOPHAGEAL DILATION WITH 56FR MALONEY DILATOR;  Surgeon: [name], MD;  Location: [facility];  Service: Endoscopy;  Laterality: N/A;  . ESOPHAGOGASTRODUODENOSCOPY (EGD) WITH PROPOFOL N/A 05/07/2015   RMR: Status post dilation of normal esophagus. Gastritis.  Marland Kitchen KNEE SURGERY Left    arthroscopy  . MANDIBLE FRACTURE SURGERY    . POLYPECTOMY  05/07/2015   Procedure: POLYPECTOMY (Hepatic Flexure, Distal Transverse Colon, Rectal);  Surgeon: [name], MD;  Location: [facility];  Service: Endoscopy;;  . TONSILLECTOMY  Family History  Problem Relation Age of Onset  . Diabetes Father   . Hypertension Father   . Arthritis    . Diabetes    . Asthma    . Stroke Sister     Social History:  reports that he has been smoking Cigarettes.  He has a 30.00 pack-year smoking history. He has never used smokeless tobacco. He reports that he drinks alcohol. He reports that he uses drugs, including Marijuana.  Allergies:  Allergies  Allergen Reactions  . Aspirin Swelling    Medications:  I have reviewed the patient's current medications. Prior to Admission:   Prescriptions Prior to Admission  Medication Sig Dispense Refill Last Dose  . ALPRAZolam (XANAX) 1 MG tablet Take 1 mg by mouth 2 (two) times daily.   03/20/2016 at Unknown time  . amLODipine (NORVASC) 10 MG tablet Take 10 mg by mouth daily.   03/21/2016 at Unknown time  . atenolol (TENORMIN) 100 MG tablet Take 100 mg by mouth daily.     03/21/2016 at 0900  . atorvastatin (LIPITOR) 40 MG tablet Take 1 tablet (40 mg total) by mouth daily. 90 tablet 0 03/21/2016 at Unknown time  . cephALEXin (KEFLEX) 500 MG capsule Take 1 capsule (500 mg total) by mouth 2 (two) times daily. 14 capsule 0 03/21/2016 at Unknown time  . clopidogrel (PLAVIX) 75 MG tablet Take 75 mg by mouth daily.   03/21/2016 at Unknown time  . divalproex (DEPAKOTE) 500 MG DR tablet Take 3 tablets (1,500 mg total) by mouth 2 (two) times daily. 540 tablet 4 03/21/2016 at Unknown time  . FLUoxetine (PROZAC) 20 MG capsule Take 20 mg by mouth daily.   03/20/2016 at Unknown time  . glucose blood (ACCU-CHEK AVIVA) test strip Use to test 4 times a day. E11.65 150 each 5 03/21/2016 at Unknown time  . HYDROcodone-acetaminophen (NORCO) 7.5-325 MG tablet Take 1 tablet by mouth every 6 (six) hours as needed for moderate pain.   Past Week at Unknown time  . insulin aspart (NOVOLOG) 100 UNIT/ML FlexPen Inject 5-11 Units into the skin 3 (three) times daily with meals. 5 pen 2 Past Week at Unknown time  . Insulin Glargine (LANTUS SOLOSTAR [place]) Inject 30 Units into the skin at bedtime.   03/20/2016 at Unknown time  . isosorbide mononitrate (IMDUR) 60 MG 24 hr tablet Take 1 tablet (60 mg total) by mouth daily. 30 tablet 5 03/21/2016 at Unknown time  . levETIRAcetam (KEPPRA) 500 MG tablet Take 2 tablets (1,000 mg total) by mouth 2 (two) times daily. (Patient taking differently: Take 1,500 mg by mouth 2 (two) times daily. ) 360 tablet 4 03/21/2016 at Unknown time  . lisinopril-hydrochlorothiazide (PRINZIDE,ZESTORETIC) 20-25 MG tablet Take 1 tablet by mouth daily.    03/21/2016 at Unknown time  . Lurasidone HCl (LATUDA) 60 MG TABS Take 120 mg by mouth daily.    03/20/2016 at Unknown time  . nitroGLYCERIN (NITROSTAT) 0.4 MG SL tablet Place 0.4 mg under the tongue every 5 (five) minutes as needed for chest pain.   03/21/2016 at Unknown time  . potassium chloride (K-DUR) 10 MEQ tablet Take 1 tablet (10 mEq total) by mouth daily. 90 tablet 1 03/21/2016 at Unknown time   Scheduled: . ALPRAZolam  1 mg Oral BID  . amLODipine  10 mg Oral Daily  . atenolol  100 mg Oral Daily  . atorvastatin  40 mg Oral Daily  . divalproex  1,500 mg Oral BID  . FLUoxetine  20 mg  Oral Daily  . hydrochlorothiazide  25 mg Oral Daily  . insulin aspart  0-15 Units Subcutaneous TID WC  . isosorbide mononitrate  90 mg Oral Daily  . levETIRAcetam  1,500 mg Oral BID  . lisinopril  20 mg Oral Daily  . lurasidone  120 mg Oral Daily  . potassium chloride  10 mEq Oral Daily  . sodium chloride flush  3 mL Intravenous Q12H   Continuous: . heparin 800 Units/hr (03/21/16 2329)   JGG:EZMOQH chloride, acetaminophen, HYDROcodone-acetaminophen, nitroGLYCERIN, ondansetron (ZOFRAN) IV, sodium chloride flush Anti-infectives    None      Results for orders placed or performed during the hospital encounter of 03/21/16 (from the past 48 hour(s))  Basic metabolic panel     Status: Abnormal   Collection Time: 03/21/16  3:02 PM  Result Value Ref Range   Sodium 138 135 - 145 mmol/L   Potassium 4.1 3.5 - 5.1 mmol/L   Chloride 104 101 - 111 mmol/L   CO2 27 22 - 32 mmol/L   Glucose, Bld 214 (H) 65 - 99 mg/dL   BUN 16 6 - 20 mg/dL   Creatinine, Ser 1.03 0.61 - 1.24 mg/dL   Calcium 9.2 8.9 - 10.3 mg/dL   GFR calc non Af Amer >60 >60 mL/min   GFR calc Af Amer >60 >60 mL/min    Comment: (NOTE) The eGFR has been calculated using the CKD EPI equation. This calculation has not been validated in all clinical situations. eGFR's persistently <60 mL/min signify possible Chronic Kidney Disease.    Anion  gap 7 5 - 15  CBC     Status: Abnormal   Collection Time: 03/21/16  3:02 PM  Result Value Ref Range   WBC 6.0 4.0 - 10.5 K/uL   RBC 4.50 4.22 - 5.81 MIL/uL   Hemoglobin 14.0 13.0 - 17.0 g/dL   HCT 40.4 39.0 - 52.0 %   MCV 89.8 78.0 - 100.0 fL   MCH 31.1 26.0 - 34.0 pg   MCHC 34.7 30.0 - 36.0 g/dL   RDW 12.6 11.5 - 15.5 %   Platelets 134 (L) 150 - 400 K/uL  I-stat troponin, ED     Status: None   Collection Time: 03/21/16  3:14 PM  Result Value Ref Range   Troponin i, poc 0.00 0.00 - 0.08 ng/mL   Comment 3            Comment: Due to the release kinetics of cTnI, a negative result within the first hours of the onset of symptoms does not rule out myocardial infarction with certainty. If myocardial infarction is still suspected, repeat the test at appropriate intervals.   Glucose, capillary     Status: Abnormal   Collection Time: 03/21/16  6:44 PM  Result Value Ref Range   Glucose-Capillary 148 (H) 65 - 99 mg/dL   Comment 1 Notify RN    Comment 2 Document in Chart   Protime-INR     Status: None   Collection Time: 03/21/16  7:10 PM  Result Value Ref Range   Prothrombin Time 14.1 11.4 - 15.2 seconds   INR 1.08   Troponin I     Status: None   Collection Time: 03/21/16  7:10 PM  Result Value Ref Range   Troponin I <0.03 <0.03 ng/mL  Glucose, capillary     Status: Abnormal   Collection Time: 03/21/16  9:07 PM  Result Value Ref Range   Glucose-Capillary 218 (H) 65 - 99 mg/dL  Troponin  I     Status: None   Collection Time: 03/22/16 12:33 AM  Result Value Ref Range   Troponin I <0.03 <0.03 ng/mL  Glucose, capillary     Status: Abnormal   Collection Time: 03/22/16  6:25 AM  Result Value Ref Range   Glucose-Capillary 147 (H) 65 - 99 mg/dL  Comprehensive metabolic panel     Status: Abnormal   Collection Time: 03/22/16  8:53 AM  Result Value Ref Range   Sodium 140 135 - 145 mmol/L   Potassium 4.0 3.5 - 5.1 mmol/L   Chloride 103 101 - 111 mmol/L   CO2 31 22 - 32 mmol/L    Glucose, Bld 74 65 - 99 mg/dL   BUN 13 6 - 20 mg/dL   Creatinine, Ser 0.94 0.61 - 1.24 mg/dL   Calcium 9.3 8.9 - 10.3 mg/dL   Total Protein 5.8 (L) 6.5 - 8.1 g/dL   Albumin 3.7 3.5 - 5.0 g/dL   AST 15 15 - 41 U/L   ALT 14 (L) 17 - 63 U/L   Alkaline Phosphatase 37 (L) 38 - 126 U/L   Total Bilirubin 1.1 0.3 - 1.2 mg/dL   GFR calc non Af Amer >60 >60 mL/min   GFR calc Af Amer >60 >60 mL/min    Comment: (NOTE) The eGFR has been calculated using the CKD EPI equation. This calculation has not been validated in all clinical situations. eGFR's persistently <60 mL/min signify possible Chronic Kidney Disease.    Anion gap 6 5 - 15  Troponin I     Status: None   Collection Time: 03/22/16  8:53 AM  Result Value Ref Range   Troponin I <0.03 <0.03 ng/mL  CBC     Status: Abnormal   Collection Time: 03/22/16  8:53 AM  Result Value Ref Range   WBC 4.9 4.0 - 10.5 K/uL   RBC 4.68 4.22 - 5.81 MIL/uL   Hemoglobin 14.8 13.0 - 17.0 g/dL   HCT 42.1 39.0 - 52.0 %   MCV 90.0 78.0 - 100.0 fL   MCH 31.6 26.0 - 34.0 pg   MCHC 35.2 30.0 - 36.0 g/dL   RDW 12.6 11.5 - 15.5 %   Platelets 120 (L) 150 - 400 K/uL  Protime-INR     Status: None   Collection Time: 03/22/16  8:53 AM  Result Value Ref Range   Prothrombin Time 14.3 11.4 - 15.2 seconds   INR 1.11   Lipid panel     Status: None   Collection Time: 03/22/16  8:53 AM  Result Value Ref Range   Cholesterol 120 0 - 200 mg/dL   Triglycerides 37 <150 mg/dL   HDL 60 >40 mg/dL   Total CHOL/HDL Ratio 2.0 RATIO   VLDL 7 0 - 40 mg/dL   LDL Cholesterol 53 0 - 99 mg/dL    Comment:        Total Cholesterol/HDL:CHD Risk Coronary Heart Disease Risk Table                     Men   Women  1/2 Average Risk   3.4   3.3  Average Risk       5.0   4.4  2 X Average Risk   9.6   7.1  3 X Average Risk  23.4   11.0        Use the calculated Patient Ratio above and the CHD Risk Table to determine the patient's CHD Risk.  ATP III CLASSIFICATION (LDL):   <100     mg/dL   Optimal  100-129  mg/dL   Near or Above                    Optimal  130-159  mg/dL   Borderline  160-189  mg/dL   High  >190     mg/dL   Very High   Heparin level (unfractionated)     Status: None   Collection Time: 03/22/16  8:53 AM  Result Value Ref Range   Heparin Unfractionated 0.34 0.30 - 0.70 IU/mL    Comment:        IF HEPARIN RESULTS ARE BELOW EXPECTED VALUES, AND PATIENT DOSAGE HAS BEEN CONFIRMED, SUGGEST FOLLOW UP TESTING OF ANTITHROMBIN III LEVELS.   Glucose, capillary     Status: None   Collection Time: 03/22/16 11:09 AM  Result Value Ref Range   Glucose-Capillary 99 65 - 99 mg/dL    Dg Chest 2 View  Result Date: 03/21/2016 CLINICAL DATA:  Two weeks of chest pain, shortness of breath, left shoulder pain; history of coronary artery disease with 2 previous MIs, heavy smoking history, diabetes EXAM: CHEST  2 VIEW COMPARISON:  PA and lateral chest x-ray of March 04, 2016 FINDINGS: The lungs are adequately inflated. There is no focal infiltrate. There is a 2 mm diameter projecting over the lateral aspect of the left 6 rib. This is not been previously demonstrated. The heart and pulmonary vascularity are normal. The mediastinum is normal in width. There is no pleural effusion. There is calcification in the wall of the aortic arch. The patient has undergone previous lower anterior cervical fusion. The thoracic vertebral bodies are preserved in height. IMPRESSION: There is no pneumonia nor CHF. **An incidental finding of potential clinical significance has been found. New 2 mm nodule peripherally in the left upper lobe overlying the lateral aspect of the 6th rib. ** Given the patient's long history of smoking, chest CT scanning is recommended for further evaluation of this nodule. These results will be called to the ordering clinician or representative by the Radiologist Assistant, and communication documented in the PACS or zVision Dashboard. Electronically Signed    By: David  Martinique M.D.   On: 03/21/2016 15:49    Review of Systems  Constitutional: Positive for diaphoresis and malaise/fatigue. Negative for chills, fever and weight loss.  HENT: Negative.   Eyes: Negative.   Respiratory: Positive for shortness of breath. Negative for cough and hemoptysis.   Cardiovascular: Positive for chest pain. Negative for palpitations, orthopnea, leg swelling and PND.  Gastrointestinal: Negative.   Genitourinary: Negative.   Musculoskeletal: Negative.   Skin: Negative.   Neurological: Negative.   Endo/Heme/Allergies: Negative.   Psychiatric/Behavioral:       Bipolar disorder and schizophrenia controlled on medications. Has hallucinations and hears voices if he stops his meds.   Blood pressure 116/79, pulse (!) 54, temperature 97.5 F (36.4 C), temperature source Oral, resp. rate 20, weight 68.2 kg (150 lb 6.4 oz), SpO2 100 %. Physical Exam  Constitutional: He is oriented to person, place, and time. He appears well-developed and well-nourished. No distress.  HENT:  Head: Normocephalic and atraumatic.  Poor dentition with many missing teeth  Eyes: EOM are normal. Pupils are equal, round, and reactive to light.  Neck: Normal range of motion. Neck supple. No JVD present. No thyromegaly present.  Cardiovascular: Normal rate, regular rhythm, normal heart sounds and intact distal pulses.   No murmur heard.  Respiratory: Effort normal and breath sounds normal. No respiratory distress. He has no wheezes.  GI: Soft. Bowel sounds are normal. He exhibits no distension and no mass. There is no tenderness.  Musculoskeletal: Normal range of motion. He exhibits no edema.  Lymphadenopathy:    He has no cervical adenopathy.  Neurological: He is alert and oriented to person, place, and time. He has normal strength. No cranial nerve deficit or sensory deficit.  Skin: Skin is warm and dry.  Psychiatric: He has a normal mood and affect.   Belva Crome, MD (Primary)      Procedures   Left Heart Cath and Coronary Angiography  Conclusion     Ost Cx lesion, 65 %stenosed.  Mid RCA lesion, 100 %stenosed.  Dist RCA lesion, 100 %stenosed.  1st Diag lesion, 70 %stenosed.  1st Sept lesion, 95 %stenosed.  Mid LAD to Dist LAD lesion, 75 %stenosed.  The left ventricular ejection fraction is 50-55% by visual estimate.  The left ventricular systolic function is normal.  LV end diastolic pressure is mildly elevated.    Severe coronary artery disease with total occlusion of the mid RCA filling left-to-right by collaterals. Severe diffuse mid LAD 75% stenosis within a calcified tortuous segment. Moderate eccentric ostial circumflex 65% stenosis.  Overall normal left ventricular function, with upper normal LVEDP, and estimated ejection fraction of 55%.   RECOMMENDATIONS:   Further up-titrate anti-anginal therapy by adding Imdur 60 mg per day.  Aggressive risk factor modification discussed with the patient including better diabetes control and smoking cessation.  If all maximal medical therapy there are still limiting symptoms, I'll recommend consideration of bypass surgery given total occlusion of the RCA, and segmental tortuous mid LAD which would probably be better treated surgical therapy rather than stenting (Allergy to aspirin/tortuosity/calcification/long segment of disease).   Indications   CAD in native artery [I25.10 (ICD-10-CM)]  Angina pectoris ([place]) [I20.9 (ICD-10-CM)]  Procedural Details/Technique   Technical Details The right radial area was sterilely prepped and draped. Intravenous sedation with Versed and fentanyl was administered. 1% Xylocaine was infiltrated to achieve local analgesia. A double wall stick with an angiocath was utilized to obtain intra-arterial access. The modified Seldinger technique was used to place a 31F " Slender" sheath in the right radial artery. Weight based heparin was administered. Coronary angiography was  done using 5 F catheters. Right coronary angiography was performed with a JR4. Left ventricular hemodymic recordings and angiography was done using the JR 4 catheter and hand injection. Left coronary angiography was performed with a JL 3.5 cm.  Hemostasis was achieved using a pneumatic band.  During this procedure the patient is administered a total of Versed 2 mg and Fentanyl 75 mg to achieve and maintain moderate conscious sedation. The patient's heart rate, blood pressure, and oxygen saturation are monitored continuously during the procedure. The period of conscious sedation is 29 minutes, of which I was present face-to-face 100% of this time.   Estimated blood loss <50 mL. .    Coronary Findings   Dominance: Co-dominant  Left Anterior Descending  Mid LAD to Dist LAD lesion, 75% stenosed.  First Diagonal Branch  1st Diag lesion, 70% stenosed.  First Septal Branch  1st Sept lesion, 95% stenosed.  Second Diagonal Branch  Vessel is small in size.  Third Diagonal Branch  Vessel is small in size.  Ramus Intermedius  Vessel is small.  Left Circumflex  Ost Cx lesion, 65% stenosed.  Third Obtuse Marginal Branch  Vessel is small  in size.  Right Coronary Artery  Mid RCA lesion, 100% stenosed.  Dist RCA lesion, 100% stenosed.  Right Posterior Descending Artery  RPDA filled by collaterals from Dist LAD.  First Right Posterolateral  1st RPLB filled by collaterals from Dist Cx.  Wall Motion   Resting               Left Heart   Left Ventricle The left ventricular size is normal. The left ventricular systolic function is normal. LV end diastolic pressure is mildly elevated. The left ventricular ejection fraction is 50-55% by visual estimate. No regional wall motion abnormalities.    Coronary Diagrams   Diagnostic Diagram     Implants     No implant documentation for this case.  PACS Images   Show images for Cardiac catheterization   Link to Procedure Log   Procedure Log     Hemo Data   Flowsheet Row Most Recent Value  AO Systolic Pressure 893 mmHg  AO Diastolic Pressure 70 mmHg  AO Mean 86 mmHg  LV Systolic Pressure 810 mmHg  LV Diastolic Pressure 3 mmHg  LV EDP 18 mmHg  Arterial Occlusion Pressure Extended Systolic Pressure 175 mmHg  Arterial Occlusion Pressure Extended Diastolic Pressure 66 mmHg  Arterial Occlusion Pressure Extended Mean Pressure 82 mmHg  Left Ventricular Apex Extended Systolic Pressure 102 mmHg  Left Ventricular Apex Extended Diastolic Pressure 7 mmHg  Left Ventricular Apex Extended EDP Pressure 18 mmHg      *[facility]*                         [address],  [address]                            [phone]  ------------------------------------------------------------------- Transthoracic Echocardiography  Patient:    Dan, Scearce MR #:       [id number] Study Date: 03/09/2016 Gender:     M Age:        25 Height:     170.2 cm Weight:     66.2 kg BSA:        1.77 m^2 Pt. Status: Room:   [place]acey, MD  Camp Dennison, MD  Floris, MD  PERFORMING   [facility], [name]  SONOGRAPHER  Alvino Chapel, RCS  cc:  ------------------------------------------------------------------- LV EF: 60% -   65%  ------------------------------------------------------------------- Indications:      Chest pain 786.51.  ------------------------------------------------------------------- History:   PMH:  NSTEMI.  Risk factors:  Hypertension. Diabetes mellitus.  ------------------------------------------------------------------- Study Conclusions  - Left ventricle: The cavity size was normal. Wall thickness was   increased in a pattern of moderate LVH. Systolic function was   normal. The estimated ejection fraction was in the range of 60%   to 65%. Wall motion was normal; there were no regional wall    motion abnormalities. Left ventricular diastolic function   parameters were normal. - Aortic valve: Valve area (VTI): 3.35 cm^2. Valve area (Vmax): 3.5   cm^2. Valve area (Vmean): 3.23 cm^2. - Mitral valve: Mildly calcified annulus. - Atrial septum: No defect or patent foramen ovale was identified. - Technically adequate study.  ------------------------------------------------------------------- Labs, prior tests, procedures, and  surgery: S/P Cardiac Cath.  ------------------------------------------------------------------- Study data:  No prior study was available for comparison.  Study status:  Routine.  Procedure:  The patient reported no pain pre or post test. Transthoracic echocardiography. Image quality was adequate.  Study completion:  There were no complications. Transthoracic echocardiography.  M-mode, complete 2D, spectral Doppler, and color Doppler.  Birthdate:  Patient birthdate: May 13, 1966.  Age:  Patient is 50 yr old.  Sex:  Gender: male. BMI: 22.9 kg/m^2.  Blood pressure:     148/91  Patient status: Inpatient.  Study date:  Study date: 03/09/2016. Study time: 09:32 AM.  Location:  [facility].  -------------------------------------------------------------------  ------------------------------------------------------------------- Left ventricle:  The cavity size was normal. Wall thickness was increased in a pattern of moderate LVH. Systolic function was normal. The estimated ejection fraction was in the range of 60% to 65%. Wall motion was normal; there were no regional wall motion abnormalities. Left ventricular diastolic function parameters were normal.  ------------------------------------------------------------------- Aortic valve:   Trileaflet; normal thickness leaflets.  Doppler: There was no stenosis.   There was no significant regurgitation. VTI ratio of LVOT to aortic valve: 0.88. Valve area (VTI): 3.35 cm^2. Indexed valve area (VTI): 1.89  cm^2/m^2. Peak velocity ratio of LVOT to aortic valve: 0.92. Valve area (Vmax): 3.5 cm^2. Indexed valve area (Vmax): 1.97 cm^2/m^2. Mean velocity ratio of LVOT to aortic valve: 0.85. Valve area (Vmean): 3.23 cm^2. Indexed valve area (Vmean): 1.82 cm^2/m^2.    Mean gradient (S): 3 mm Hg. Peak gradient (S): 6 mm Hg.  ------------------------------------------------------------------- Aorta:  Aortic root: The aortic root was normal in size.  ------------------------------------------------------------------- Mitral valve:   Mildly calcified annulus.  Doppler:   There was no evidence for stenosis.   There was no significant regurgitation. Peak gradient (D): 2 mm Hg.  ------------------------------------------------------------------- Left atrium:  The atrium was normal in size.  ------------------------------------------------------------------- Atrial septum:  No defect or patent foramen ovale was identified.   ------------------------------------------------------------------- Right ventricle:  The cavity size was normal. Wall thickness was normal. Systolic function was normal.  ------------------------------------------------------------------- Pulmonic valve:   Not well visualized.  Doppler:   There was no evidence for stenosis.   There was mild to moderate regurgitation.   ------------------------------------------------------------------- Tricuspid valve:   Normal thickness leaflets.  Doppler:   There was no evidence for stenosis.   There was mild regurgitation.  ------------------------------------------------------------------- Pulmonary artery:   Systolic pressure was within the normal range.   ------------------------------------------------------------------- Right atrium:  The atrium was normal in size.  ------------------------------------------------------------------- Pericardium:  There was no pericardial  effusion.  ------------------------------------------------------------------- Systemic veins: Inferior vena cava: The vessel was normal in size. The respirophasic diameter changes were in the normal range (>= 50%), consistent with normal central venous pressure.  ------------------------------------------------------------------- Measurements   Left ventricle                           Value           Reference  LV ID, ED, PLAX chordal          (L)     41.5   mm       43 - 52  LV ID, ES, PLAX chordal                  25.4   mm       23 - 38  LV fx shortening, PLAX chordal           39     %        >=  29  LV PW thickness, ED                      12.4   mm       ---------  IVS/LV PW ratio, ED                      1.02            <=1.3    Ventricular septum                       Value           Reference  IVS thickness, ED                        12.7   mm       ---------    LVOT                                     Value           Reference  LVOT ID, S                               22     mm       ---------  LVOT area                                3.8    cm^2     ---------  LVOT peak velocity, S                    114    cm/s     ---------  LVOT mean velocity, S                    67.9   cm/s     ---------  LVOT VTI, S                              22.4   cm       ---------  LVOT peak gradient, S                    5      mm Hg    ---------    Aortic valve                             Value           Reference  Aortic valve peak velocity, S            123.48 cm/s     ---------  Aortic valve mean velocity, S            79.88  cm/s     ---------  Aortic valve VTI, S                      25.59  cm       ---------  Aortic mean gradient, S                  3  mm Hg    ---------  Aortic peak gradient, S                  6      mm Hg    ---------  VTI ratio, LVOT/AV                       0.88            ---------  Aortic valve area, VTI                   3.35   cm^2     ---------   Aortic valve area/bsa, VTI               1.89   cm^2/m^2 ---------  Velocity ratio, peak, LVOT/AV            0.92            ---------  Aortic valve area, peak velocity         3.5    cm^2     ---------  Aortic valve area/bsa, peak              1.97   cm^2/m^2 ---------  velocity  Velocity ratio, mean, LVOT/AV            0.85            ---------  Aortic valve area, mean velocity         3.23   cm^2     ---------  Aortic valve area/bsa, mean              1.82   cm^2/m^2 ---------  velocity    Aorta                                    Value           Reference  Aortic root ID, ED                       37     mm       ---------    Left atrium                              Value           Reference  LA ID, A-P, ES                           34     mm       ---------  LA volume/bsa, S                         23.1   ml/m^2   ---------    Mitral valve                             Value           Reference  Mitral E-wave peak velocity              77.5   cm/s     ---------  Mitral A-wave peak velocity              68.3  cm/s     ---------  Mitral deceleration time         (H)     303    ms       150 - 230  Mitral peak gradient, D                  2      mm Hg    ---------  Mitral E/A ratio, peak                   1.1             ---------    Pulmonary arteries                       Value           Reference  PA pressure, S, DP                       17     mm Hg    <=30    Right ventricle                          Value           Reference  TAPSE                                    21.7   mm       ---------  RV s&', lateral, S                        12.1   cm/s     ---------  Legend: (L)  and  (H)  mark values outside specified reference range.  ------------------------------------------------------------------- Prepared and Electronically Authenticated by  Kerry Hough, M.D. 2017-08-09T15:34:29   Assessment/Plan:  I have personally reviewed his medical record, cath and echo  studies. He has severe multi-vessel coronary artery disease with recurrent exertional anginal symptoms that have occurred a low level of activity such as walking on level ground. He is on the maximum medical therapy that he will tolerate and still having symptoms. I agree with the need for CABG. He has been on Plavix and will need to be off of that for at least 5 days preop. His last dose was yesterday and I would plan to do surgery next Monday. I see in his chart that he has refused blood product transfusion in the past and I will need to discuss that further with him. His Hbg now is 14.8 so it should not be an issue as long as he maintains a normal HgB. I would give him a week off Plavix and avoid excess blood drawing. I discussed the operative procedure with the patient and family including alternatives, benefits and risks; including but not limited to bleeding,  infection, stroke, myocardial infarction, graft failure, heart block requiring a permanent pacemaker, organ dysfunction, and death.  Lendon Ka Cordial understands and agrees to proceed.     I spent 80 minutes performing this consultation and > 50% of this time was spent face to face counseling and coordinating the care of this patient's severe multi-vessel coronary artery disease.   [name] 03/22/2016, 2:18 PM   .

## 2016-03-22 NOTE — Progress Notes (Signed)
[place] for heparin  Indication: chest pain/ACS  Allergies  Allergen Reactions  . Aspirin Swelling    Patient Measurements: Weight: 150 lb 6.4 oz (68.2 kg) Heparin Dosing Weight: 66kg  Vital Signs: Temp: 97.5 F (36.4 C) (08/22 0627) Temp Source: Oral (08/22 0627) BP: 116/79 (08/22 1356) Pulse Rate: 54 (08/22 1356)  Labs:  Recent Labs  03/21/16 1502 03/21/16 1910 03/22/16 0033 03/22/16 0853 03/22/16 1442  HGB 14.0  --   --  14.8  --   HCT 40.4  --   --  42.1  --   PLT 134*  --   --  120*  --   LABPROT  --  14.1  --  14.3  --   INR  --  1.08  --  1.11  --   HEPARINUNFRC  --   --   --  0.34 0.26*  CREATININE 1.03  --   --  0.94  --   TROPONINI  --  <0.03 <0.03 <0.03  --     Estimated Creatinine Clearance: 85.9 mL/min (by C-G formula based on SCr of 0.94 mg/dL).   Medical History: Past Medical History:  Diagnosis Date  . Anxiety   . Bipolar 1 disorder ([place])   . Chest pain   . Chronic back pain   . Coronary artery disease   . Depression   . Diabetes mellitus    Type II  . Hallucinations    "long history of them"  . Headache(784.0)   . History of gout   . HTN (hypertension)   . Insomnia   . Neuropathy ([place])   . Pain management   . Schizophrenia ([place])   . Seizures ([place])    last sz between July 5-9th, 2016; epilepsy  . Shortness of breath dyspnea    with exertion  . Sleep apnea   . Transfusion of blood product refused for religious reason     Assessment: 21 YOM with hx of severe CAD, admitted with chest pain, pharmacy is consulted to start IV heparin for r/o ACS. -Heparin level is 0.26 and below goal   Goal of Therapy:  Heparin level 0.3-0.7 units/ml Monitor platelets by anticoagulation protocol: Yes   Plan:  -Increase heparin to 950 units/hr -heparin level and CBC in am  Hildred Laser, Pharm D 03/22/2016 4:28 PM

## 2016-03-22 NOTE — Progress Notes (Signed)
Patient Name: Melvin Taylor Date of Encounter: 03/22/2016     Active Problems:   Chest pain    SUBJECTIVE  He had chest pain last night and having some labored breathing as well. Waiting to see [place] . ALPRAZolam  1 mg Oral BID  . amLODipine  10 mg Oral Daily  . atenolol  100 mg Oral Daily  . atorvastatin  40 mg Oral Daily  . divalproex  1,500 mg Oral BID  . FLUoxetine  20 mg Oral Daily  . hydrochlorothiazide  25 mg Oral Daily  . insulin aspart  0-15 Units Subcutaneous TID WC  . isosorbide mononitrate  90 mg Oral Daily  . levETIRAcetam  1,500 mg Oral BID  . lisinopril  20 mg Oral Daily  . lurasidone  120 mg Oral Daily  . potassium chloride  10 mEq Oral Daily  . sodium chloride flush  3 mL Intravenous Q12H    OBJECTIVE  Vitals:   03/21/16 1715 03/21/16 1837 03/21/16 2102 03/22/16 0627  BP: 134/83 (!) 151/104 134/85 (!) 160/94  Pulse: (!) 51 (!) 45 (!) 52 (!) 50  Resp: 24 20 20 18   Temp:  98 F (36.7 C) 98.4 F (36.9 C) 97.5 F (36.4 C)  TempSrc:  Oral Oral Oral  SpO2: 100% 100% 99% 100%  Weight:    150 lb 6.4 oz (68.2 kg)   No intake or output data in the 24 hours ending 03/22/16 0902 Filed Weights   03/22/16 0627  Weight: 150 lb 6.4 oz (68.2 kg)    PHYSICAL EXAM  General: Pleasant, NAD. Neuro: Alert and oriented X 3. Moves all extremities spontaneously. Psych: Normal affect. HEENT:  Normal  Neck: Supple without bruits or JVD. Lungs:  Resp regular and unlabored, CTA. Heart: RRR no s3, s4, or murmurs. Abdomen: Soft, non-tender, non-distended, BS + x 4.  Extremities: No clubbing, cyanosis or edema. DP/PT/Radials 2+ and equal bilaterally.  Accessory Clinical Findings  CBC  Recent Labs  03/21/16 1502  WBC 6.0  HGB 14.0  HCT 40.4  MCV 89.8  PLT [id number]*   Basic Metabolic Panel  Recent Labs  03/21/16 1502  NA 138  K 4.1  CL 104  CO2 27  GLUCOSE 214*  BUN 16  CREATININE 1.03  CALCIUM 9.2   Cardiac Enzymes  Recent  Labs  03/21/16 1910 03/22/16 0033  TROPONINI <0.03 <0.03    TELE  Some sinus brady  Radiology/Studies  Dg Chest 2 View  Result Date: 03/21/2016 CLINICAL DATA:  Two weeks of chest pain, shortness of breath, left shoulder pain; history of coronary artery disease with 2 previous MIs, heavy smoking history, diabetes EXAM: CHEST  2 VIEW COMPARISON:  PA and lateral chest x-ray of March 04, 2016 FINDINGS: The lungs are adequately inflated. There is no focal infiltrate. There is a 2 mm diameter projecting over the lateral aspect of the left 6 rib. This is not been previously demonstrated. The heart and pulmonary vascularity are normal. The mediastinum is normal in width. There is no pleural effusion. There is calcification in the wall of the aortic arch. The patient has undergone previous lower anterior cervical fusion. The thoracic vertebral bodies are preserved in height. IMPRESSION: There is no pneumonia nor CHF. **An incidental finding of potential clinical significance has been found. New 2 mm nodule peripherally in the left upper lobe overlying the lateral aspect of the 6th rib. ** Given the patient's long history of smoking, chest CT scanning is  recommended for further evaluation of this nodule. These results will be called to the ordering clinician or representative by the Radiologist Assistant, and communication documented in the PACS or zVision Dashboard. Electronically Signed   By: Yacine Droz  [place] M.D.   On: 03/21/2016 15:49   Dg Chest 2 View  Result Date: 03/04/2016 CLINICAL DATA:  Hypertension, preop for cardiac catheterization. EXAM: CHEST  2 VIEW COMPARISON:  Radiograph of February 23, 2016. FINDINGS: The heart size and mediastinal contours are within normal limits. Both lungs are clear. No pneumothorax or pleural effusion is noted. The visualized skeletal structures are unremarkable. IMPRESSION: No active cardiopulmonary disease. Electronically Signed   By: Marijo Conception, M.D.   On: 03/04/2016  15:19   [place] Upper Ext Art Right Ltd  Result Date: 03/17/2016 CLINICAL DATA:  50 year old male with a history of prior cardiac catheterization and concern for potential pseudoaneurysm EXAM: RIGHT UPPER EXTREMITY ARTERIAL DUPLEX SCAN TECHNIQUE: Gray-scale sonography as well as color Doppler and duplex ultrasound was performed to evaluate the arteries of the upper extremity. COMPARISON:  None. FINDINGS: Directed duplex of the right upper extremity performed. Hypodense soft tissue/infiltration overlying the radial artery. No pseudoaneurysm identified. Triphasic waveform of the radial artery which remains patent. IMPRESSION: Edema/ ecchymosis overlying the right radial artery puncture site with no evidence of pseudoaneurysm. Unremarkable radial artery waveform. Signed, Dulcy Fanny. Earleen Newport, DO [facility] Radiology Electronically Signed   By: Corrie Mckusick D.O.   On: 03/17/2016 14:16    2D ECHO: 03/09/2016 LV EF: 60% - 65% Study Conclusions - Left ventricle: The cavity size was normal. Wall thickness was increased in a pattern of moderate LVH. Systolic function was normal. The estimated ejection fraction was in the range of 60% to 65%. Wall motion was normal; there were no regional wall motion abnormalities. Left ventricular diastolic function parameters were normal. - Aortic valve: Valve area (VTI): 3.35 cm^2. Valve area (Vmax): 3.5 cm^2. Valve area (Vmean): 3.23 cm^2. - Mitral valve: Mildly calcified annulus. - Atrial septum: No defect or patent foramen ovale was identified. - Technically adequate study.   Procedures 03/07/16 Left Heart Cath and Coronary Angiography  Conclusion   Ost Cx lesion, 65 %stenosed.  Mid RCA lesion, 100 %stenosed.  Dist RCA lesion, 100 %stenosed.  1st Diag lesion, 70 %stenosed.  1st Sept lesion, 95 %stenosed.  Mid LAD to Dist LAD lesion, 75 %stenosed.  The left ventricular ejection fraction is  50-55% by visual estimate.  The left ventricular systolic function is normal.  LV end diastolic pressure is mildly elevated.   Severe coronary artery disease with total occlusion of the mid RCA filling left-to-right by collaterals. Severe diffuse mid LAD 75% stenosis within a calcified tortuous segment. Moderate eccentric ostial circumflex 65% stenosis.  Overall normal left ventricular function, with upper normal LVEDP, and estimated ejection fraction of 55%. RECOMMENDATIONS:  Further up-titrate anti-anginal therapy by adding Imdur 60 mg per day.  Aggressive risk factor modification discussed with the patient including better diabetes control and smoking cessation.  If all maximal medical therapy there are still limiting symptoms, I'll recommend consideration of bypass surgery given total occlusion of the RCA, and segmental tortuous mid LAD which would probably be better treated surgical therapy rather than stenting (Allergy to aspirin/tortuosity/calcification/long segment of disease).    ASSESSMENT AND PLAN Melvin Taylor is a 50 y.o. male with a history of HTN, HLD, uncontrolled DMT2, OSA on CPAP, tobacco abuse and recently diagnosed multivessel CAD who presented to  [facility] ED today with chest pain.   CAD/Chest pain concerning for unstable angina: troponin neg x2. ECG with no acute ST or TW changes. He has known severe CAD. He continues to have chest pain despite compliance with medications. We cannot titrate BB due to bradycardia. Imdur increased from 60mg  to 90mg  and he was started on IV heparin. Plavix discontinued for possible CABG. I have called in a CT surgery consult. If he rules out and remains CP free, he may be able to go home and return for surgery, if indicated.  -- Continue statin and BB. Of note, he has a history of an allergy to Aspirin  HTN: BP with moderate control. Continue current meds and increase imdur as above  HLD: no recent lipid panel. Will obtain fasting  lipid panel pending  Uncontrolled DMT2: HG A1c 10.3. Continue SSI   Tobacco abuse: he has quit.   Signed, Angelena Form PA-C  Pager [phone]  I have seen, examined and evaluated the patient this PM after Ms. Thompson,PA-C's initial evaluation..  After reviewing all the available data and chart, we discussed the patients laboratory, study & physical findings as well as symptoms in detail. I agree with her findings, examination as well as impression recommendations as per our discussion.    Since last night, he is not had any further chest pain. His blood pressure is much better controlled now. He was seen by Dr. Cyndia Bent from [place] [facility], who agrees that CABG is warranted. The plan is for him to hold his Plavix with scheduled CABG on this coming Monday. As we discussed yesterday, Mr. Runser would love to go to go home as opposed to staying in the hospital. He has ruled out for MI, and has not had any additional chest pain all day long.  He has ruled out for MI, his blood pressure is better controlled and we have increased his Imdur dose. I would also add Ranexa 500 mg twice a day for antianginal effect until post CABG. DC IV heparin. We'll ask him to ambulate around the room, and expect to discharge this evening.  If he were to have significant exacerbation of symptoms, he should return and just stay admitted until his CABG. He will remain off Plavix until surgery.  I have talked with CT surgery scheduling, and they will plan to arrange his preop evaluations in the outpatient setting.    Glenetta Hew, M.D., M.S. Interventional Cardiologist   Pager # [phone] Phone # [phone] [address] [place], [place] [address]

## 2016-03-22 NOTE — Progress Notes (Addendum)
[place] for heparin  Indication: chest pain/ACS  Allergies  Allergen Reactions  . Aspirin Swelling    Patient Measurements: Weight: 150 lb 6.4 oz (68.2 kg) Heparin Dosing Weight: 66kg  Vital Signs: Temp: 97.5 F (36.4 C) (08/22 0627) Temp Source: Oral (08/22 0627) BP: 160/94 (08/22 0627) Pulse Rate: 50 (08/22 0627)  Labs:  Recent Labs  03/21/16 1502 03/21/16 1910 03/22/16 0033 03/22/16 0853  HGB 14.0  --   --  14.8  HCT 40.4  --   --  42.1  PLT 134*  --   --  120*  LABPROT  --  14.1  --  14.3  INR  --  1.08  --  1.11  HEPARINUNFRC  --   --   --  0.34  CREATININE 1.03  --   --   --   TROPONINI  --  <0.03 <0.03  --     Estimated Creatinine Clearance: 78.4 mL/min (by C-G formula based on SCr of 1.03 mg/dL).   Medical History: Past Medical History:  Diagnosis Date  . Anxiety   . Bipolar 1 disorder ([place])   . Chest pain   . Chronic back pain   . Depression   . Diabetes mellitus    Type II  . Hallucinations    "long history of them"  . Headache(784.0)   . History of gout   . HTN (hypertension)   . Insomnia   . Neuropathy ([place])   . Pain management   . Schizophrenia ([place])   . Seizures ([place])    last sz between July 5-9th, 2016; epilepsy  . Shortness of breath dyspnea    with exertion  . Sleep apnea   . Transfusion of blood product refused for religious reason     Assessment: 59 YOM with hx of severe CAD, admitted with chest pain, pharmacy is consulted to start IV heparin for r/o ACS. Baseline hgb 14, pltc 134K, not on anticoagulation PTA.  HL this am is therapeutic at 0.34 on heparin 800 units/hr. No issues with infusion or bleeding noted.  Goal of Therapy:  Heparin level 0.3-0.7 units/ml Monitor platelets by anticoagulation protocol: Yes   Plan:  Continue heparin 800 units/hr 6h confirmatory HL Daily HL Monitor s/sx of bleeding  Andrey Cota. Diona Foley, PharmD, BCPS Clinical Pharmacist Pager  [phone] 03/22/2016,9:02 AM

## 2016-03-23 ENCOUNTER — Other Ambulatory Visit: Payer: Self-pay | Admitting: *Deleted

## 2016-03-23 DIAGNOSIS — I2510 Atherosclerotic heart disease of native coronary artery without angina pectoris: Secondary | ICD-10-CM

## 2016-03-24 ENCOUNTER — Ambulatory Visit (HOSPITAL_BASED_OUTPATIENT_CLINIC_OR_DEPARTMENT_OTHER)
Admission: RE | Admit: 2016-03-24 | Discharge: 2016-03-24 | Disposition: A | Discharge status: Home / Self Care | Payer: Medicaid Other | Source: Ambulatory Visit | Attending: Surgery | Admitting: Surgery

## 2016-03-24 ENCOUNTER — Ambulatory Visit: Payer: Medicaid Other | Admitting: Cardiovascular Disease

## 2016-03-24 ENCOUNTER — Other Ambulatory Visit (HOSPITAL_COMMUNITY): Payer: Self-pay | Admitting: *Deleted

## 2016-03-24 ENCOUNTER — Encounter (HOSPITAL_COMMUNITY): Payer: Self-pay

## 2016-03-24 ENCOUNTER — Encounter (HOSPITAL_COMMUNITY)
Admission: RE | Admit: 2016-03-24 | Discharge: 2016-03-24 | Disposition: A | Discharge status: Home / Self Care | Payer: Medicaid Other | Source: Ambulatory Visit | Attending: Surgery | Admitting: Surgery

## 2016-03-24 ENCOUNTER — Ambulatory Visit (HOSPITAL_COMMUNITY)
Admission: RE | Admit: 2016-03-24 | Discharge: 2016-03-24 | Disposition: A | Discharge status: Home / Self Care | Payer: Medicaid Other | Source: Ambulatory Visit | Attending: Surgery | Admitting: Surgery

## 2016-03-24 ENCOUNTER — Encounter (HOSPITAL_COMMUNITY): Payer: Medicaid Other

## 2016-03-24 DIAGNOSIS — Z0183 Encounter for blood typing: Secondary | ICD-10-CM | POA: Insufficient documentation

## 2016-03-24 DIAGNOSIS — F319 Bipolar disorder, unspecified: Secondary | ICD-10-CM | POA: Diagnosis not present

## 2016-03-24 DIAGNOSIS — I6523 Occlusion and stenosis of bilateral carotid arteries: Secondary | ICD-10-CM | POA: Insufficient documentation

## 2016-03-24 DIAGNOSIS — I2510 Atherosclerotic heart disease of native coronary artery without angina pectoris: Secondary | ICD-10-CM

## 2016-03-24 DIAGNOSIS — Z794 Long term (current) use of insulin: Secondary | ICD-10-CM | POA: Insufficient documentation

## 2016-03-24 DIAGNOSIS — G4733 Obstructive sleep apnea (adult) (pediatric): Secondary | ICD-10-CM | POA: Insufficient documentation

## 2016-03-24 DIAGNOSIS — G40909 Epilepsy, unspecified, not intractable, without status epilepticus: Secondary | ICD-10-CM | POA: Insufficient documentation

## 2016-03-24 DIAGNOSIS — Z981 Arthrodesis status: Secondary | ICD-10-CM | POA: Diagnosis not present

## 2016-03-24 DIAGNOSIS — E1165 Type 2 diabetes mellitus with hyperglycemia: Secondary | ICD-10-CM | POA: Insufficient documentation

## 2016-03-24 DIAGNOSIS — Z79899 Other long term (current) drug therapy: Secondary | ICD-10-CM | POA: Diagnosis not present

## 2016-03-24 DIAGNOSIS — Z01818 Encounter for other preprocedural examination: Secondary | ICD-10-CM | POA: Insufficient documentation

## 2016-03-24 DIAGNOSIS — Z01812 Encounter for preprocedural laboratory examination: Secondary | ICD-10-CM | POA: Diagnosis not present

## 2016-03-24 DIAGNOSIS — I10 Essential (primary) hypertension: Secondary | ICD-10-CM | POA: Insufficient documentation

## 2016-03-24 LAB — COMPREHENSIVE METABOLIC PANEL
ALBUMIN: 4 g/dL (ref 3.5–5.0)
ALK PHOS: 38 U/L (ref 38–126)
ALT: 15 U/L — ABNORMAL LOW (ref 17–63)
AST: 22 U/L (ref 15–41)
Anion gap: 10 (ref 5–15)
BILIRUBIN TOTAL: 1.1 mg/dL (ref 0.3–1.2)
BUN: 23 mg/dL — AB (ref 6–20)
CALCIUM: 9 mg/dL (ref 8.9–10.3)
CO2: 20 mmol/L — ABNORMAL LOW (ref 22–32)
Chloride: 105 mmol/L (ref 101–111)
Creatinine, Ser: 1.15 mg/dL (ref 0.61–1.24)
GFR calc Af Amer: >60 mL/min (ref >60)
GFR calc non Af Amer: >60 mL/min (ref >60)
GLUCOSE: 126 mg/dL — AB (ref 65–99)
Potassium: 4.1 mmol/L (ref 3.5–5.1)
Sodium: 135 mmol/L (ref 135–145)
TOTAL PROTEIN: 6.3 g/dL — AB (ref 6.5–8.1)

## 2016-03-24 LAB — VAS US DOPPLER PRE CABG
LCCADDIAS: -14 cm/s
LCCADSYS: -64 cm/s
LCCAPDIAS: 15 cm/s
LCCAPSYS: 99 cm/s
LEFT ECA DIAS: -7 cm/s
LEFT VERTEBRAL DIAS: 15 cm/s
LICADDIAS: -36 cm/s
LICADSYS: -78 cm/s
LICAPSYS: -27 cm/s
Left ICA prox dias: -10 cm/s
RIGHT ECA DIAS: -5 cm/s
RIGHT VERTEBRAL DIAS: 17 cm/s
Right CCA prox dias: 13 cm/s
Right CCA prox sys: 74 cm/s
Right cca dist sys: -47 cm/s

## 2016-03-24 LAB — PULMONARY FUNCTION TEST
DL/VA % PRED: 100 %
DL/VA: 4.48 ml/min/mmHg/L
DLCO UNC % PRED: 71 %
DLCO UNC: 20.28 ml/min/mmHg
DLCO cor % pred: 71 %
DLCO cor: 20.17 ml/min/mmHg
FEF 25-75 PRE: 1.49 L/s
FEF 25-75 Post: 1.5 L/sec
FEF2575-%Change-Post: 0 %
FEF2575-%PRED-PRE: 50 %
FEF2575-%Pred-Post: 50 %
FEV1-%Change-Post: 0 %
FEV1-%PRED-POST: 71 %
FEV1-%PRED-PRE: 71 %
FEV1-POST: 2.11 L
FEV1-PRE: 2.13 L
FEV1FVC-%Change-Post: 3 %
FEV1FVC-%Pred-Pre: 91 %
FEV6-%CHANGE-POST: -3 %
FEV6-%PRED-POST: 77 %
FEV6-%Pred-Pre: 80 %
FEV6-Post: 2.79 L
FEV6-Pre: 2.89 L
FEV6FVC-%CHANGE-POST: 0 %
FEV6FVC-%PRED-POST: 103 %
FEV6FVC-%PRED-PRE: 102 %
FVC-%Change-Post: -4 %
FVC-%Pred-Post: 75 %
FVC-%Pred-Pre: 78 %
FVC-Post: 2.79 L
FVC-Pre: 2.92 L
POST FEV6/FVC RATIO: 100 %
PRE FEV1/FVC RATIO: 73 %
Post FEV1/FVC ratio: 76 %
Pre FEV6/FVC Ratio: 99 %
RV % PRED: 102 %
RV: 1.97 L
TLC % PRED: 78 %
TLC: 5.02 L

## 2016-03-24 LAB — BLOOD GAS, ARTERIAL
Acid-base deficit: 0.3 mmol/L (ref 0.0–2.0)
Bicarbonate: 23.9 mEq/L (ref 20.0–24.0)
Drawn by: 421801
FIO2: 0.21
O2 SAT: 98.2 %
PH ART: 7.395 (ref 7.350–7.450)
PO2 ART: 108 mmHg — AB (ref 80.0–100.0)
Patient temperature: 98.6
TCO2: 25.1 mmol/L (ref 0–100)
pCO2 arterial: 39.9 mmHg (ref 35.0–45.0)

## 2016-03-24 LAB — URINE MICROSCOPIC-ADD ON

## 2016-03-24 LAB — CBC
HEMATOCRIT: 40.6 % (ref 39.0–52.0)
HEMOGLOBIN: 14.3 g/dL (ref 13.0–17.0)
MCH: 31.5 pg (ref 26.0–34.0)
MCHC: 35.2 g/dL (ref 30.0–36.0)
MCV: 89.4 fL (ref 78.0–100.0)
Platelets: 129 10*3/uL — ABNORMAL LOW (ref 150–400)
RBC: 4.54 MIL/uL (ref 4.22–5.81)
RDW: 12.6 % (ref 11.5–15.5)
WBC: 6.7 10*3/uL (ref 4.0–10.5)

## 2016-03-24 LAB — GLUCOSE, CAPILLARY: GLUCOSE-CAPILLARY: 107 mg/dL — AB (ref 65–99)

## 2016-03-24 LAB — SURGICAL PCR SCREEN
MRSA, PCR: NEGATIVE
Staphylococcus aureus: NEGATIVE

## 2016-03-24 LAB — URINALYSIS, ROUTINE W REFLEX MICROSCOPIC
BILIRUBIN URINE: NEGATIVE
Glucose, UA: NEGATIVE mg/dL
HGB URINE DIPSTICK: NEGATIVE
Ketones, ur: 15 mg/dL — AB
Nitrite: NEGATIVE
PH: 5.5 (ref 5.0–8.0)
Protein, ur: NEGATIVE mg/dL
SPECIFIC GRAVITY, URINE: 1.024 (ref 1.005–1.030)

## 2016-03-24 LAB — APTT: aPTT: 36 seconds (ref 24–36)

## 2016-03-24 LAB — PROTIME-INR
INR: 1.1
Prothrombin Time: 14.2 seconds (ref 11.4–15.2)

## 2016-03-24 MED ORDER — ALBUTEROL SULFATE (2.5 MG/3ML) 0.083% IN NEBU
2.5000 mg | INHALATION_SOLUTION | Freq: Once | RESPIRATORY_TRACT | Status: AC
  Administered 2016-03-24: 2.5 mg via RESPIRATORY_TRACT

## 2016-03-24 NOTE — Progress Notes (Signed)
PCP - Rosita Fire Cardiologist - denies  Chest x-ray - 03/24/16 EKG - 03/22/16 Stress Test - denies ECHO - 03/09/16 Cardiac Cath - 03/07/16  Fasting glucose 118/180 Checks 4 times a day  Instructed him to bring CPAP mask and  To not smoke weed    Patient denies shortness of breath, fever, cough and chest pain at PAT appointment

## 2016-03-24 NOTE — Progress Notes (Signed)
Pre-op Cardiac Surgery  Carotid Findings:  Findings suggest 1-39% internal carotid artery stenosis bilaterally. Vertebral arteries are patent with antegrade flow.   Upper Extremity Right Left  Brachial Pressures 113-Triphasic 107-Triphasic  Radial Waveforms Triphasic Triphasic  Ulnar Waveforms Triphasic Triphasic  Palmar Arch (Allen's Test) Signal obliterates with radial compression, is unaffected with ulnar compression. Signal obliterates with radial compression, is unaffected with ulnar compression.    Lower  Extremity Right Left  Dorsalis Pedis 134-Triphasic 121-Triphasic  Anterior Tibial    Posterior Tibial 130-Triphasic 133-Triphasic  Ankle/Brachial Indices 1.19 1.18    Findings:   Bilateral ABIs are within normal limits at rest.  03/24/2016 1:53 PM Melvin Taylor, BS, RVT, RDCS, RDMS

## 2016-03-24 NOTE — Pre-Procedure Instructions (Signed)
Melvin Taylor  03/24/2016      PHARMACARE AT [facility] [address], [place] [address] Phone: [phone] Fax: [phone] - [place], [place] [address] Phone: [phone] Fax: [phone]    Your procedure is scheduled on August 28  Report to [facility] [place].M.  Call this number if you have problems the morning of surgery:  [phone]   Remember:  Do not eat food or drink liquids after midnight.   Take these medicines the morning of surgery with A SIP OF WATER alprazolam (xanax), amlodipine (norvasc), atenolol (tenormin), divalproex (depakote), fluoxetine (prozac), norco If needed, isosorbide mononitrate (IMDUR), levetiracetam (keppra), latuda, nitro if neede,d ranolazine (ranexa), lisinopril-hydrochlorothiazide  7 days prior to surgery STOP taking any Aspirin, Aleve, Naproxen, Ibuprofen, Motrin, Advil, Goody's, BC's, all herbal medications, fish oil, and all vitamins    How to Manage Your Diabetes Before and After Surgery  Why is it important to control my blood sugar before and after surgery? . Improving blood sugar levels before and after surgery helps healing and can limit problems. . A way of improving blood sugar control is eating a healthy diet by: o  Eating less sugar and carbohydrates o  Increasing activity/exercise o  Talking with your doctor about reaching your blood sugar goals . High blood sugars (greater than 180 mg/dL) can raise your risk of infections and slow your recovery, so you will need to focus on controlling your diabetes during the weeks before surgery. . Make sure that the doctor who takes care of your diabetes knows about your planned surgery including the date and location.  How do I manage my blood sugar before surgery? . Check your blood sugar at least 4 times a day, starting 2 days before  surgery, to make sure that the level is not too high or low. o Check your blood sugar the morning of your surgery when you wake up and every 2 hours until you get to the Short Stay unit. . If your blood sugar is less than 70 mg/dL, you will need to treat for low blood sugar: o Do not take insulin. o Treat a low blood sugar (less than 70 mg/dL) with  cup of clear juice (cranberry or apple), 4 glucose tablets, OR glucose gel. o Recheck blood sugar in 15 minutes after treatment (to make sure it is greater than 70 mg/dL). If your blood sugar is not greater than 70 mg/dL on recheck, call [phone] for further instructions. . Report your blood sugar to the short stay nurse when you get to Short Stay.  . If you are admitted to the hospital after surgery: o Your blood sugar will be checked by the staff and you will probably be given insulin after surgery (instead of oral diabetes medicines) to make sure you have good blood sugar levels. o The goal for blood sugar control after surgery is 80-180 mg/dL.    WHAT DO I DO ABOUT MY DIABETES MEDICATION?   Marland Kitchen Do not take oral diabetes medicines (pills) the morning of surgery.  . THE NIGHT BEFORE SURGERY, take _15__________ units of ___LANTUS________insulin.       Marland Kitchen HE MORNING OF SURGERY, take _____0________ units of ___LANTUS and novolog_______insulin.   . If your CBG is greater than 220 mg/dL, you may take  of your sliding scale (correction) dose of insulin.  Do not wear jewelry  Do not wear lotions, powders, or colonge, or deoderant.  Men may shave face and neck.  Do not bring valuables to the hospital.  [facility] is not responsible for any belongings or valuables.  Contacts, dentures or bridgework may not be worn into surgery.  Leave your suitcase in the car.  After surgery it may be brought to your room.  For patients admitted to the hospital, discharge time will be determined by your treatment team.  Patients discharged the day  of surgery will not be allowed to drive home.   Special instructions:   [facility]- Preparing For Surgery  Before surgery, you can play an important role. Because skin is not sterile, your skin needs to be as free of germs as possible. You can reduce the number of germs on your skin by washing with CHG (chlorahexidine gluconate) Soap before surgery.  CHG is an antiseptic cleaner which kills germs and bonds with the skin to continue killing germs even after washing.  Please do not use if you have an allergy to CHG or antibacterial soaps. If your skin becomes reddened/irritated stop using the CHG.  Do not shave (including legs and underarms) for at least 48 hours prior to first CHG shower. It is OK to shave your face.  Please follow these instructions carefully.   1. Shower the NIGHT BEFORE SURGERY and the MORNING OF SURGERY with CHG.   2. If you chose to wash your hair, wash your hair first as usual with your normal shampoo.  3. After you shampoo, rinse your hair and body thoroughly to remove the shampoo.  4. Use CHG as you would any other liquid soap. You can apply CHG directly to the skin and wash gently with a scrungie or a clean washcloth.   5. Apply the CHG Soap to your body ONLY FROM THE NECK DOWN.  Do not use on open wounds or open sores. Avoid contact with your eyes, ears, mouth and genitals (private parts). Wash genitals (private parts) with your normal soap.  6. Wash thoroughly, paying special attention to the area where your surgery will be performed.  7. Thoroughly rinse your body with warm water from the neck down.  8. DO NOT shower/wash with your normal soap after using and rinsing off the CHG Soap.  9. Pat yourself dry with a CLEAN TOWEL.   10. Wear CLEAN PAJAMAS   11. Place CLEAN SHEETS on your bed the night of your first shower and DO NOT SLEEP WITH PETS.    Day of Surgery: Do not apply any deodorants/lotions. Please wear clean clothes to the hospital/surgery  center.      Please read over the following fact sheets that you were given. Pain Booklet, Coughing and Deep Breathing, MRSA Information and Surgical Site Infection Prevention

## 2016-03-25 ENCOUNTER — Encounter (HOSPITAL_COMMUNITY): Payer: Self-pay

## 2016-03-25 LAB — HEMOGLOBIN A1C
Hgb A1c MFr Bld: 10.4 % — ABNORMAL HIGH (ref 4.8–5.6)
Mean Plasma Glucose: 252 mg/dL

## 2016-03-25 LAB — NO BLOOD PRODUCTS

## 2016-03-25 MED ORDER — METOPROLOL TARTRATE 12.5 MG HALF TABLET: 12.5000 mg | ORAL_TABLET | Freq: Once | ORAL | Status: DC

## 2016-03-25 MED ORDER — CHLORHEXIDINE GLUCONATE 0.12 % MT SOLN
15.0000 mL | Freq: Once | OROMUCOSAL | Status: AC
  Administered 2016-03-28: 15 mL via OROMUCOSAL
  Filled 2016-03-25: qty 15

## 2016-03-25 NOTE — Progress Notes (Signed)
Anesthesia Chart Review: Patient is a 50 year old male scheduled for CABG on 03/28/16 by Dr. Cyndia Bent.  History includes smoking, CAD, DM2 (poorly controlled), HTN, Bipolar 1, schizophrenia, seizure disorder, OSA (on CPAP), C5-6 ACDF 12/14/15. REFUSAL OF BLOOD PRODUCTS FOR RELIGIOUS REASONS.  PCP is Dr. Rosita Fire. Cardiologist is Dr. Bronson Ing. He was first seen earlier this month after patient was hospitalized at [facility] with chest pain. Patient reported troponin was elevated at 0.22.   Meds include atenolol, amlodipine, Depakote, Prozac, Novolog, Imdur, Keppra, lisinopril-HCTZ, Latuda, KCl, Ranexa. On 03/22/16, Dr. Cyndia Bent discontinued his Plavix in preparation for CABG.  BP 108/66   Pulse (!) 53   Temp 36.5 C   Resp 20   Ht 5\' 7"  (1.702 m)   Wt 143 lb 8 oz (65.1 kg)   SpO2 100%   BMI 22.48 kg/m    See Dr. Earlene Plater' note regarding recent cardiac testing results.   03/24/16 Pre-CABG Dopplers (Carotid U/S, ABI): Summary: - Findings suggest 1-39% internal carotid artery stenosis bilaterally. Vertebral arteries are patent with antegrade flow. - Bilateral ABIs are within normal limits at rest.   Most recent EKG, CXR, PFTs, labs noted. A1c is 10.4, consistent with mean plasma glucose of 252.  He reported fasting CBGs of 118-180. H/H 14.3/40.6. PLT 129K. H/H and A1c results routed to Dr. Cyndia Bent and TCTS RN Thurmond Butts. Dr. Cyndia Bent thought that as long as patient's HGB remained in a normal range that he could undergo CABG despite refusal of blood products. Confirmed with patient's PAT RN Janett Billow that patient signed the NO BLOOD PRODUCTS FORM. She will fax it to the Blood Bank.  Anesthesiologist to evaluate on the day of surgery.   George Hugh [facility] Short Stay Center/Anesthesiology Phone [phone] 03/25/2016 10:19 AM

## 2016-03-27 MED ORDER — PHENYLEPHRINE HCL 10 MG/ML IJ SOLN
30.0000 ug/min | INTRAMUSCULAR | Status: AC
  Administered 2016-03-28: 20 ug/min via INTRAVENOUS
  Filled 2016-03-27: qty 2

## 2016-03-27 MED ORDER — POTASSIUM CHLORIDE 2 MEQ/ML IV SOLN
80.0000 meq | INTRAVENOUS | Status: DC
  Filled 2016-03-27: qty 40

## 2016-03-27 MED ORDER — DEXTROSE 5 % IV SOLN
1.5000 g | INTRAVENOUS | Status: AC
  Administered 2016-03-28: .75 g via INTRAVENOUS
  Administered 2016-03-28: 1.5 g via INTRAVENOUS
  Filled 2016-03-27 (×2): qty 1.5

## 2016-03-27 MED ORDER — DEXMEDETOMIDINE HCL IN NACL 400 MCG/100ML IV SOLN
0.1000 ug/kg/h | INTRAVENOUS | Status: AC
  Administered 2016-03-28: 0.7 ug/kg/h via INTRAVENOUS
  Filled 2016-03-27: qty 100

## 2016-03-27 MED ORDER — SODIUM CHLORIDE 0.9 % IV SOLN
INTRAVENOUS | Status: DC
  Filled 2016-03-27: qty 30

## 2016-03-27 MED ORDER — DOPAMINE-DEXTROSE 3.2-5 MG/ML-% IV SOLN
0.0000 ug/kg/min | INTRAVENOUS | Status: DC
  Filled 2016-03-27: qty 250

## 2016-03-27 MED ORDER — MAGNESIUM SULFATE 50 % IJ SOLN
40.0000 meq | INTRAMUSCULAR | Status: DC
  Filled 2016-03-27: qty 10

## 2016-03-27 MED ORDER — EPINEPHRINE HCL 1 MG/ML IJ SOLN
0.0000 ug/min | INTRAMUSCULAR | Status: DC
Start: 2016-03-28 — End: 2016-03-28
  Filled 2016-03-27: qty 4

## 2016-03-27 MED ORDER — SODIUM CHLORIDE 0.9 % IV SOLN
INTRAVENOUS | Status: AC
  Administered 2016-03-28: 1 [IU]/h via INTRAVENOUS
  Filled 2016-03-27: qty 2.5

## 2016-03-27 MED ORDER — CHLORHEXIDINE GLUCONATE 4 % EX LIQD: 30.0000 mL | CUTANEOUS | Status: DC

## 2016-03-27 MED ORDER — PLASMA-LYTE 148 IV SOLN
INTRAVENOUS | Status: AC
  Administered 2016-03-28: 500 mL
  Filled 2016-03-27: qty 2.5

## 2016-03-27 MED ORDER — NITROGLYCERIN IN D5W 200-5 MCG/ML-% IV SOLN
2.0000 ug/min | INTRAVENOUS | Status: AC
  Administered 2016-03-28: 5 ug/min via INTRAVENOUS
  Filled 2016-03-27: qty 250

## 2016-03-27 MED ORDER — DEXTROSE 5 % IV SOLN
750.0000 mg | INTRAVENOUS | Status: DC
  Filled 2016-03-27: qty 750

## 2016-03-27 MED ORDER — VANCOMYCIN HCL 10 G IV SOLR
1250.0000 mg | INTRAVENOUS | Status: AC
  Administered 2016-03-28: 1250 mg via INTRAVENOUS
  Filled 2016-03-27: qty 1250

## 2016-03-27 MED ORDER — AMINOCAPROIC ACID 250 MG/ML IV SOLN
INTRAVENOUS | Status: AC
  Administered 2016-03-28: 69.8 mL/h via INTRAVENOUS
  Filled 2016-03-27: qty 40

## 2016-03-28 ENCOUNTER — Inpatient Hospital Stay (HOSPITAL_COMMUNITY): Payer: Medicaid Other

## 2016-03-28 ENCOUNTER — Inpatient Hospital Stay (HOSPITAL_COMMUNITY): Payer: Medicaid Other | Admitting: Vascular Surgery

## 2016-03-28 ENCOUNTER — Inpatient Hospital Stay (HOSPITAL_COMMUNITY): Payer: Medicaid Other | Admitting: Anesthesiology

## 2016-03-28 ENCOUNTER — Encounter (HOSPITAL_COMMUNITY): Payer: Self-pay | Admitting: *Deleted

## 2016-03-28 ENCOUNTER — Inpatient Hospital Stay (HOSPITAL_COMMUNITY)
Admission: RE | Disposition: A | Discharge status: Home / Self Care | Payer: Self-pay | Source: Ambulatory Visit | Attending: Surgery

## 2016-03-28 ENCOUNTER — Inpatient Hospital Stay (HOSPITAL_COMMUNITY)
Admission: RE | Admit: 2016-03-28 | Discharge: 2016-04-03 | DRG: 236 | Disposition: A | Discharge status: Home / Self Care | Payer: Medicaid Other | Source: Ambulatory Visit | Attending: Surgery | Admitting: Surgery

## 2016-03-28 DIAGNOSIS — Z981 Arthrodesis status: Secondary | ICD-10-CM | POA: Diagnosis not present

## 2016-03-28 DIAGNOSIS — E1140 Type 2 diabetes mellitus with diabetic neuropathy, unspecified: Secondary | ICD-10-CM | POA: Diagnosis present

## 2016-03-28 DIAGNOSIS — G4730 Sleep apnea, unspecified: Secondary | ICD-10-CM | POA: Diagnosis present

## 2016-03-28 DIAGNOSIS — E8770 Fluid overload, unspecified: Secondary | ICD-10-CM | POA: Diagnosis not present

## 2016-03-28 DIAGNOSIS — Z794 Long term (current) use of insulin: Secondary | ICD-10-CM

## 2016-03-28 DIAGNOSIS — G40909 Epilepsy, unspecified, not intractable, without status epilepticus: Secondary | ICD-10-CM | POA: Diagnosis present

## 2016-03-28 DIAGNOSIS — D62 Acute posthemorrhagic anemia: Secondary | ICD-10-CM | POA: Diagnosis not present

## 2016-03-28 DIAGNOSIS — Z886 Allergy status to analgesic agent status: Secondary | ICD-10-CM

## 2016-03-28 DIAGNOSIS — I2582 Chronic total occlusion of coronary artery: Secondary | ICD-10-CM | POA: Diagnosis present

## 2016-03-28 DIAGNOSIS — Z79899 Other long term (current) drug therapy: Secondary | ICD-10-CM | POA: Diagnosis not present

## 2016-03-28 DIAGNOSIS — R001 Bradycardia, unspecified: Secondary | ICD-10-CM | POA: Diagnosis not present

## 2016-03-28 DIAGNOSIS — F419 Anxiety disorder, unspecified: Secondary | ICD-10-CM | POA: Diagnosis present

## 2016-03-28 DIAGNOSIS — D696 Thrombocytopenia, unspecified: Secondary | ICD-10-CM | POA: Diagnosis present

## 2016-03-28 DIAGNOSIS — Z7902 Long term (current) use of antithrombotics/antiplatelets: Secondary | ICD-10-CM | POA: Diagnosis not present

## 2016-03-28 DIAGNOSIS — F209 Schizophrenia, unspecified: Secondary | ICD-10-CM | POA: Diagnosis present

## 2016-03-28 DIAGNOSIS — E785 Hyperlipidemia, unspecified: Secondary | ICD-10-CM | POA: Diagnosis present

## 2016-03-28 DIAGNOSIS — Z951 Presence of aortocoronary bypass graft: Secondary | ICD-10-CM

## 2016-03-28 DIAGNOSIS — I959 Hypotension, unspecified: Secondary | ICD-10-CM | POA: Diagnosis not present

## 2016-03-28 DIAGNOSIS — Z8249 Family history of ischemic heart disease and other diseases of the circulatory system: Secondary | ICD-10-CM

## 2016-03-28 DIAGNOSIS — F17210 Nicotine dependence, cigarettes, uncomplicated: Secondary | ICD-10-CM | POA: Diagnosis present

## 2016-03-28 DIAGNOSIS — I10 Essential (primary) hypertension: Secondary | ICD-10-CM | POA: Diagnosis present

## 2016-03-28 DIAGNOSIS — E1165 Type 2 diabetes mellitus with hyperglycemia: Secondary | ICD-10-CM | POA: Diagnosis present

## 2016-03-28 DIAGNOSIS — I2584 Coronary atherosclerosis due to calcified coronary lesion: Secondary | ICD-10-CM | POA: Diagnosis present

## 2016-03-28 DIAGNOSIS — R339 Retention of urine, unspecified: Secondary | ICD-10-CM | POA: Diagnosis not present

## 2016-03-28 DIAGNOSIS — I2510 Atherosclerotic heart disease of native coronary artery without angina pectoris: Secondary | ICD-10-CM | POA: Diagnosis present

## 2016-03-28 DIAGNOSIS — F319 Bipolar disorder, unspecified: Secondary | ICD-10-CM | POA: Diagnosis present

## 2016-03-28 DIAGNOSIS — I25110 Atherosclerotic heart disease of native coronary artery with unstable angina pectoris: Secondary | ICD-10-CM

## 2016-03-28 HISTORY — PX: CORONARY ARTERY BYPASS GRAFT: SHX141

## 2016-03-28 HISTORY — PX: TEE WITHOUT CARDIOVERSION: SHX5443

## 2016-03-28 HISTORY — PX: ENDOVEIN HARVEST OF GREATER SAPHENOUS VEIN: SHX5059

## 2016-03-28 LAB — POCT I-STAT 3, ART BLOOD GAS (G3+)
ACID-BASE DEFICIT: 3 mmol/L — AB (ref 0.0–2.0)
ACID-BASE DEFICIT: 1 mmol/L (ref 0.0–2.0)
ACID-BASE EXCESS: 4 mmol/L — AB (ref 0.0–2.0)
ACID-BASE EXCESS: 4 mmol/L — AB (ref 0.0–2.0)
Acid-Base Excess: 2 mmol/L (ref 0.0–2.0)
BICARBONATE: 29.6 meq/L — AB (ref 20.0–24.0)
BICARBONATE: 26.1 meq/L — AB (ref 20.0–24.0)
BICARBONATE: 22.9 meq/L (ref 20.0–24.0)
BICARBONATE: 24.3 meq/L — AB (ref 20.0–24.0)
Bicarbonate: 28.3 mEq/L — ABNORMAL HIGH (ref 20.0–24.0)
O2 SAT: 100 %
O2 SAT: 100 %
O2 Saturation: 100 %
O2 Saturation: 100 %
O2 Saturation: 100 %
PCO2 ART: 35.6 mmHg (ref 35.0–45.0)
PH ART: 7.4 (ref 7.350–7.450)
PH ART: 7.43 (ref 7.350–7.450)
PH ART: 7.47 — AB (ref 7.350–7.450)
PO2 ART: 454 mmHg — AB (ref 80.0–100.0)
PO2 ART: 180 mmHg — AB (ref 80.0–100.0)
PO2 ART: 175 mmHg — AB (ref 80.0–100.0)
Patient temperature: 36.9
Patient temperature: 37.2
TCO2: 31 mmol/L (ref 0–100)
TCO2: 30 mmol/L (ref 0–100)
TCO2: 27 mmol/L (ref 0–100)
TCO2: 24 mmol/L (ref 0–100)
TCO2: 26 mmol/L (ref 0–100)
pCO2 arterial: 47.7 mmHg — ABNORMAL HIGH (ref 35.0–45.0)
pCO2 arterial: 42.7 mmHg (ref 35.0–45.0)
pCO2 arterial: 43 mmHg (ref 35.0–45.0)
pCO2 arterial: 40.8 mmHg (ref 35.0–45.0)
pH, Arterial: 7.334 — ABNORMAL LOW (ref 7.350–7.450)
pH, Arterial: 7.384 (ref 7.350–7.450)
pO2, Arterial: 541 mmHg — ABNORMAL HIGH (ref 80.0–100.0)
pO2, Arterial: 195 mmHg — ABNORMAL HIGH (ref 80.0–100.0)

## 2016-03-28 LAB — POCT I-STAT, CHEM 8
BUN: 23 mg/dL — ABNORMAL HIGH (ref 6–20)
BUN: 14 mg/dL (ref 6–20)
BUN: 12 mg/dL (ref 6–20)
BUN: 13 mg/dL (ref 6–20)
BUN: 14 mg/dL (ref 6–20)
BUN: 13 mg/dL (ref 6–20)
CALCIUM ION: 1.23 mmol/L (ref 1.13–1.30)
CALCIUM ION: 1.07 mmol/L — AB (ref 1.13–1.30)
CALCIUM ION: 1.2 mmol/L (ref 1.13–1.30)
CHLORIDE: 97 mmol/L — AB (ref 101–111)
CHLORIDE: 105 mmol/L (ref 101–111)
CREATININE: 0.5 mg/dL — AB (ref 0.61–1.24)
CREATININE: 0.4 mg/dL — AB (ref 0.61–1.24)
CREATININE: 0.9 mg/dL (ref 0.61–1.24)
Calcium, Ion: 1.22 mmol/L (ref 1.13–1.30)
Calcium, Ion: 1.01 mmol/L — ABNORMAL LOW (ref 1.13–1.30)
Calcium, Ion: 1.08 mmol/L — ABNORMAL LOW (ref 1.13–1.30)
Chloride: 101 mmol/L (ref 101–111)
Chloride: 102 mmol/L (ref 101–111)
Chloride: 100 mmol/L — ABNORMAL LOW (ref 101–111)
Chloride: 95 mmol/L — ABNORMAL LOW (ref 101–111)
Creatinine, Ser: 0.4 mg/dL — ABNORMAL LOW (ref 0.61–1.24)
Creatinine, Ser: 0.6 mg/dL — ABNORMAL LOW (ref 0.61–1.24)
Creatinine, Ser: 0.6 mg/dL — ABNORMAL LOW (ref 0.61–1.24)
GLUCOSE: 145 mg/dL — AB (ref 65–99)
GLUCOSE: 139 mg/dL — AB (ref 65–99)
GLUCOSE: 103 mg/dL — AB (ref 65–99)
Glucose, Bld: 121 mg/dL — ABNORMAL HIGH (ref 65–99)
Glucose, Bld: 124 mg/dL — ABNORMAL HIGH (ref 65–99)
Glucose, Bld: 144 mg/dL — ABNORMAL HIGH (ref 65–99)
HCT: 32 % — ABNORMAL LOW (ref 39.0–52.0)
HCT: 28 % — ABNORMAL LOW (ref 39.0–52.0)
HEMATOCRIT: 35 % — AB (ref 39.0–52.0)
HEMATOCRIT: 28 % — AB (ref 39.0–52.0)
HEMATOCRIT: 27 % — AB (ref 39.0–52.0)
HEMATOCRIT: 27 % — AB (ref 39.0–52.0)
HEMOGLOBIN: 10.9 g/dL — AB (ref 13.0–17.0)
HEMOGLOBIN: 11.9 g/dL — AB (ref 13.0–17.0)
HEMOGLOBIN: 9.5 g/dL — AB (ref 13.0–17.0)
HEMOGLOBIN: 9.2 g/dL — AB (ref 13.0–17.0)
Hemoglobin: 9.2 g/dL — ABNORMAL LOW (ref 13.0–17.0)
Hemoglobin: 9.5 g/dL — ABNORMAL LOW (ref 13.0–17.0)
POTASSIUM: 4 mmol/L (ref 3.5–5.1)
POTASSIUM: 3.6 mmol/L (ref 3.5–5.1)
POTASSIUM: 4.7 mmol/L (ref 3.5–5.1)
POTASSIUM: 4.9 mmol/L (ref 3.5–5.1)
Potassium: 3.6 mmol/L (ref 3.5–5.1)
Potassium: 3.7 mmol/L (ref 3.5–5.1)
SODIUM: 142 mmol/L (ref 135–145)
SODIUM: 131 mmol/L — AB (ref 135–145)
SODIUM: 134 mmol/L — AB (ref 135–145)
Sodium: 139 mmol/L (ref 135–145)
Sodium: 140 mmol/L (ref 135–145)
Sodium: 143 mmol/L (ref 135–145)
TCO2: 30 mmol/L (ref 0–100)
TCO2: 29 mmol/L (ref 0–100)
TCO2: 27 mmol/L (ref 0–100)
TCO2: 27 mmol/L (ref 0–100)
TCO2: 28 mmol/L (ref 0–100)
TCO2: 24 mmol/L (ref 0–100)

## 2016-03-28 LAB — CBC
HEMATOCRIT: 32 % — AB (ref 39.0–52.0)
HEMATOCRIT: 30.7 % — AB (ref 39.0–52.0)
HEMOGLOBIN: 11.2 g/dL — AB (ref 13.0–17.0)
HEMOGLOBIN: 10.5 g/dL — AB (ref 13.0–17.0)
MCH: 30.9 pg (ref 26.0–34.0)
MCH: 30.6 pg (ref 26.0–34.0)
MCHC: 35 g/dL (ref 30.0–36.0)
MCHC: 34.2 g/dL (ref 30.0–36.0)
MCV: 88.4 fL (ref 78.0–100.0)
MCV: 89.5 fL (ref 78.0–100.0)
PLATELETS: 63 10*3/uL — AB (ref 150–400)
Platelets: 80 10*3/uL — ABNORMAL LOW (ref 150–400)
RBC: 3.62 MIL/uL — AB (ref 4.22–5.81)
RBC: 3.43 MIL/uL — AB (ref 4.22–5.81)
RDW: 12.6 % (ref 11.5–15.5)
RDW: 12.6 % (ref 11.5–15.5)
WBC: 5.9 10*3/uL (ref 4.0–10.5)
WBC: 9.2 10*3/uL (ref 4.0–10.5)

## 2016-03-28 LAB — PROTIME-INR
INR: 1.53
PROTHROMBIN TIME: 18.6 s — AB (ref 11.4–15.2)

## 2016-03-28 LAB — POCT I-STAT 4, (NA,K, GLUC, HGB,HCT)
Glucose, Bld: 104 mg/dL — ABNORMAL HIGH (ref 65–99)
HCT: 29 % — ABNORMAL LOW (ref 39.0–52.0)
HEMOGLOBIN: 9.9 g/dL — AB (ref 13.0–17.0)
POTASSIUM: 3.9 mmol/L (ref 3.5–5.1)
Sodium: 140 mmol/L (ref 135–145)

## 2016-03-28 LAB — GLUCOSE, CAPILLARY
GLUCOSE-CAPILLARY: 157 mg/dL — AB (ref 65–99)
GLUCOSE-CAPILLARY: 148 mg/dL — AB (ref 65–99)
GLUCOSE-CAPILLARY: 75 mg/dL (ref 65–99)
GLUCOSE-CAPILLARY: 111 mg/dL — AB (ref 65–99)
GLUCOSE-CAPILLARY: 93 mg/dL (ref 65–99)
GLUCOSE-CAPILLARY: 106 mg/dL — AB (ref 65–99)
Glucose-Capillary: 82 mg/dL (ref 65–99)
Glucose-Capillary: 113 mg/dL — ABNORMAL HIGH (ref 65–99)
Glucose-Capillary: 141 mg/dL — ABNORMAL HIGH (ref 65–99)
Glucose-Capillary: 122 mg/dL — ABNORMAL HIGH (ref 65–99)

## 2016-03-28 LAB — MAGNESIUM: MAGNESIUM: 3 mg/dL — AB (ref 1.7–2.4)

## 2016-03-28 LAB — CREATININE, SERUM
Creatinine, Ser: 0.99 mg/dL (ref 0.61–1.24)
GFR calc non Af Amer: >60 mL/min (ref >60)

## 2016-03-28 LAB — HEMOGLOBIN AND HEMATOCRIT, BLOOD
HCT: 27.8 % — ABNORMAL LOW (ref 39.0–52.0)
HEMOGLOBIN: 9.9 g/dL — AB (ref 13.0–17.0)

## 2016-03-28 LAB — APTT: aPTT: 33 seconds (ref 24–36)

## 2016-03-28 LAB — PLATELET COUNT: Platelets: 80 10*3/uL — ABNORMAL LOW (ref 150–400)

## 2016-03-28 SURGERY — CORONARY ARTERY BYPASS GRAFTING (CABG)
Anesthesia: General | Site: Leg Lower | Laterality: Right

## 2016-03-28 MED ORDER — FENTANYL CITRATE (PF) 250 MCG/5ML IJ SOLN
INTRAMUSCULAR | Status: AC
  Filled 2016-03-28: qty 25

## 2016-03-28 MED ORDER — PHENYLEPHRINE HCL 10 MG/ML IJ SOLN
0.0000 ug/min | INTRAVENOUS | Status: DC
  Administered 2016-03-28: 10 ug/min via INTRAVENOUS
  Filled 2016-03-28 (×2): qty 2

## 2016-03-28 MED ORDER — FAMOTIDINE IN NACL 20-0.9 MG/50ML-% IV SOLN
20.0000 mg | Freq: Two times a day (BID) | INTRAVENOUS | Status: AC
  Administered 2016-03-28: 20 mg via INTRAVENOUS

## 2016-03-28 MED ORDER — SODIUM CHLORIDE 0.9% FLUSH
3.0000 mL | Freq: Two times a day (BID) | INTRAVENOUS | Status: DC
  Administered 2016-03-29 – 2016-03-31 (×5): 3 mL via INTRAVENOUS
  Administered 2016-03-31: 6 mL via INTRAVENOUS
  Administered 2016-04-01: 3 mL via INTRAVENOUS

## 2016-03-28 MED ORDER — DEXMEDETOMIDINE HCL IN NACL 200 MCG/50ML IV SOLN
0.0000 ug/kg/h | INTRAVENOUS | Status: DC
  Filled 2016-03-28: qty 50

## 2016-03-28 MED ORDER — NITROGLYCERIN IN D5W 200-5 MCG/ML-% IV SOLN
0.0000 ug/min | INTRAVENOUS | Status: DC
Start: 2016-03-28 — End: 2016-03-30

## 2016-03-28 MED ORDER — ATORVASTATIN CALCIUM 40 MG PO TABS
40.0000 mg | ORAL_TABLET | Freq: Every day | ORAL | Status: DC
  Administered 2016-03-29 – 2016-04-02 (×5): 40 mg via ORAL
  Filled 2016-03-28 (×5): qty 1

## 2016-03-28 MED ORDER — LACTATED RINGERS IV SOLN
INTRAVENOUS | Status: DC | PRN
  Administered 2016-03-28: 07:00:00 via INTRAVENOUS

## 2016-03-28 MED ORDER — ACETAMINOPHEN 160 MG/5ML PO SOLN: 650.0000 mg | Freq: Once | ORAL | Status: AC

## 2016-03-28 MED ORDER — PHENYLEPHRINE HCL 10 MG/ML IJ SOLN
INTRAMUSCULAR | Status: DC | PRN
  Administered 2016-03-28: 40 ug via INTRAVENOUS
  Administered 2016-03-28: 80 ug via INTRAVENOUS
  Administered 2016-03-28: 40 ug via INTRAVENOUS

## 2016-03-28 MED ORDER — HEPARIN SODIUM (PORCINE) 1000 UNIT/ML IJ SOLN
INTRAMUSCULAR | Status: AC
Start: 2016-03-28 — End: 2016-03-28
  Filled 2016-03-28: qty 1

## 2016-03-28 MED ORDER — POTASSIUM CHLORIDE 10 MEQ/50ML IV SOLN
10.0000 meq | INTRAVENOUS | Status: AC
  Administered 2016-03-28 (×2): 10 meq via INTRAVENOUS

## 2016-03-28 MED ORDER — THROMBIN 20000 UNITS EX SOLR
CUTANEOUS | Status: AC
  Filled 2016-03-28: qty 20000

## 2016-03-28 MED ORDER — SODIUM CHLORIDE 0.9 % IV SOLN: 250.0000 mL | INTRAVENOUS | Status: DC

## 2016-03-28 MED ORDER — MIDAZOLAM HCL 10 MG/2ML IJ SOLN
INTRAMUSCULAR | Status: AC
  Filled 2016-03-28: qty 2

## 2016-03-28 MED ORDER — CHLORHEXIDINE GLUCONATE 0.12 % MT SOLN
15.0000 mL | OROMUCOSAL | Status: AC
  Administered 2016-03-28: 15 mL via OROMUCOSAL
  Filled 2016-03-28: qty 15

## 2016-03-28 MED ORDER — ONDANSETRON HCL 4 MG/2ML IJ SOLN
4.0000 mg | Freq: Four times a day (QID) | INTRAMUSCULAR | Status: DC | PRN
  Filled 2016-03-28: qty 2

## 2016-03-28 MED ORDER — MIDAZOLAM HCL 2 MG/2ML IJ SOLN: 2.0000 mg | INTRAMUSCULAR | Status: DC | PRN

## 2016-03-28 MED ORDER — FENTANYL CITRATE (PF) 250 MCG/5ML IJ SOLN
INTRAMUSCULAR | Status: DC | PRN
  Administered 2016-03-28: 150 ug via INTRAVENOUS
  Administered 2016-03-28: 250 ug via INTRAVENOUS
  Administered 2016-03-28: 150 ug via INTRAVENOUS
  Administered 2016-03-28: 200 ug via INTRAVENOUS
  Administered 2016-03-28: 25 ug via INTRAVENOUS
  Administered 2016-03-28: 150 ug via INTRAVENOUS
  Administered 2016-03-28: 250 ug via INTRAVENOUS
  Administered 2016-03-28: 25 ug via INTRAVENOUS
  Administered 2016-03-28: 50 ug via INTRAVENOUS

## 2016-03-28 MED ORDER — LACTATED RINGERS IV SOLN: INTRAVENOUS | Status: DC

## 2016-03-28 MED ORDER — METOPROLOL TARTRATE 5 MG/5ML IV SOLN: 2.5000 mg | INTRAVENOUS | Status: DC | PRN

## 2016-03-28 MED ORDER — VALPROATE SODIUM 250 MG/5ML PO SOLN
1500.0000 mg | Freq: Two times a day (BID) | ORAL | Status: DC
  Filled 2016-03-28: qty 30

## 2016-03-28 MED ORDER — SODIUM CHLORIDE 0.9 % IV SOLN
20.0000 ug | INTRAVENOUS | Status: AC
  Administered 2016-03-28: 20 ug via INTRAVENOUS
  Filled 2016-03-28: qty 5

## 2016-03-28 MED ORDER — ARTIFICIAL TEARS OP OINT
TOPICAL_OINTMENT | OPHTHALMIC | Status: AC
  Filled 2016-03-28: qty 3.5

## 2016-03-28 MED ORDER — PHENYLEPHRINE 40 MCG/ML (10ML) SYRINGE FOR IV PUSH (FOR BLOOD PRESSURE SUPPORT)
PREFILLED_SYRINGE | INTRAVENOUS | Status: AC
  Filled 2016-03-28: qty 10

## 2016-03-28 MED ORDER — PROPOFOL 10 MG/ML IV BOLUS
INTRAVENOUS | Status: AC
  Filled 2016-03-28: qty 20

## 2016-03-28 MED ORDER — NITROGLYCERIN 0.2 MG/ML ON CALL CATH LAB
INTRAVENOUS | Status: DC | PRN
  Administered 2016-03-28 (×2): 20 ug via INTRAVENOUS

## 2016-03-28 MED ORDER — TRAMADOL HCL 50 MG PO TABS
50.0000 mg | ORAL_TABLET | ORAL | Status: DC | PRN
  Administered 2016-03-29 – 2016-03-31 (×3): 100 mg via ORAL
  Filled 2016-03-28 (×3): qty 2

## 2016-03-28 MED ORDER — DIVALPROEX SODIUM 500 MG PO DR TAB
1500.0000 mg | DELAYED_RELEASE_TABLET | Freq: Two times a day (BID) | ORAL | Status: DC
  Administered 2016-03-28 – 2016-04-03 (×12): 1500 mg via ORAL
  Filled 2016-03-28 (×13): qty 3

## 2016-03-28 MED ORDER — SODIUM CHLORIDE 0.9% FLUSH: 3.0000 mL | INTRAVENOUS | Status: DC | PRN

## 2016-03-28 MED ORDER — ACETAMINOPHEN 650 MG RE SUPP
650.0000 mg | Freq: Once | RECTAL | Status: AC
  Administered 2016-03-28: 650 mg via RECTAL

## 2016-03-28 MED ORDER — PROTAMINE SULFATE 10 MG/ML IV SOLN
INTRAVENOUS | Status: DC | PRN
  Administered 2016-03-28: 190 mg via INTRAVENOUS
  Administered 2016-03-28: 10 mg via INTRAVENOUS

## 2016-03-28 MED ORDER — PROTAMINE SULFATE 10 MG/ML IV SOLN
INTRAVENOUS | Status: AC
  Filled 2016-03-28: qty 25

## 2016-03-28 MED ORDER — METOPROLOL TARTRATE 12.5 MG HALF TABLET
12.5000 mg | ORAL_TABLET | Freq: Two times a day (BID) | ORAL | Status: DC
Start: 2016-03-28 — End: 2016-03-29

## 2016-03-28 MED ORDER — BISACODYL 5 MG PO TBEC
10.0000 mg | DELAYED_RELEASE_TABLET | Freq: Every day | ORAL | Status: DC
  Administered 2016-03-29 – 2016-04-01 (×4): 10 mg via ORAL
  Filled 2016-03-28 (×4): qty 2

## 2016-03-28 MED ORDER — LIDOCAINE HCL (CARDIAC) 20 MG/ML IV SOLN
INTRAVENOUS | Status: DC | PRN
  Administered 2016-03-28: 100 mg via INTRAVENOUS

## 2016-03-28 MED ORDER — ACETAMINOPHEN 160 MG/5ML PO SOLN: 1000.0000 mg | Freq: Four times a day (QID) | ORAL | Status: DC

## 2016-03-28 MED ORDER — SODIUM CHLORIDE 0.9 % IV SOLN
INTRAVENOUS | Status: DC
Start: 2016-03-28 — End: 2016-04-01

## 2016-03-28 MED ORDER — DOCUSATE SODIUM 100 MG PO CAPS
200.0000 mg | ORAL_CAPSULE | Freq: Every day | ORAL | Status: DC
Start: 2016-03-29 — End: 2016-04-01
  Administered 2016-03-29 – 2016-04-01 (×4): 200 mg via ORAL
  Filled 2016-03-28 (×4): qty 2

## 2016-03-28 MED ORDER — MIDAZOLAM HCL 5 MG/5ML IJ SOLN
INTRAMUSCULAR | Status: DC | PRN
  Administered 2016-03-28 (×2): 2 mg via INTRAVENOUS
  Administered 2016-03-28 (×2): 1 mg via INTRAVENOUS
  Administered 2016-03-28: 2 mg via INTRAVENOUS
  Administered 2016-03-28 (×2): 1 mg via INTRAVENOUS

## 2016-03-28 MED ORDER — MAGNESIUM SULFATE 4 GM/100ML IV SOLN
4.0000 g | Freq: Once | INTRAVENOUS | Status: AC
  Administered 2016-03-28: 4 g via INTRAVENOUS
  Filled 2016-03-28: qty 100

## 2016-03-28 MED ORDER — ALBUMIN HUMAN 5 % IV SOLN
250.0000 mL | INTRAVENOUS | Status: AC | PRN
  Administered 2016-03-28: 250 mL via INTRAVENOUS

## 2016-03-28 MED ORDER — THROMBIN 20000 UNITS EX SOLR
CUTANEOUS | Status: DC | PRN
  Administered 2016-03-28: 20000 [IU] via TOPICAL

## 2016-03-28 MED ORDER — SODIUM CHLORIDE 0.9 % IV SOLN
INTRAVENOUS | Status: DC
  Administered 2016-03-28: 18:00:00 via INTRAVENOUS
  Filled 2016-03-28 (×2): qty 2.5

## 2016-03-28 MED ORDER — BISACODYL 10 MG RE SUPP: 10.0000 mg | Freq: Every day | RECTAL | Status: DC

## 2016-03-28 MED ORDER — MORPHINE SULFATE (PF) 2 MG/ML IV SOLN
2.0000 mg | INTRAVENOUS | Status: DC | PRN
  Administered 2016-03-28 – 2016-03-29 (×2): 4 mg via INTRAVENOUS
  Administered 2016-03-29 (×2): 2 mg via INTRAVENOUS
  Administered 2016-03-29: 4 mg via INTRAVENOUS
  Filled 2016-03-28: qty 1
  Filled 2016-03-28: qty 2
  Filled 2016-03-28: qty 1
  Filled 2016-03-28 (×2): qty 2
  Filled 2016-03-28: qty 1

## 2016-03-28 MED ORDER — ALBUMIN HUMAN 5 % IV SOLN
INTRAVENOUS | Status: DC | PRN
  Administered 2016-03-28: 12:00:00 via INTRAVENOUS

## 2016-03-28 MED ORDER — ROCURONIUM BROMIDE 10 MG/ML (PF) SYRINGE
PREFILLED_SYRINGE | INTRAVENOUS | Status: DC | PRN
Start: 2016-03-28 — End: 2016-03-28
  Administered 2016-03-28 (×3): 50 mg via INTRAVENOUS
  Administered 2016-03-28: 100 mg via INTRAVENOUS

## 2016-03-28 MED ORDER — SODIUM CHLORIDE 0.9 % IV SOLN
INTRAVENOUS | Status: DC | PRN
  Administered 2016-03-28: 12:00:00 via INTRAVENOUS

## 2016-03-28 MED ORDER — LEVETIRACETAM 500 MG PO TABS
1500.0000 mg | ORAL_TABLET | Freq: Two times a day (BID) | ORAL | Status: DC
  Administered 2016-03-28 – 2016-04-03 (×12): 1500 mg via ORAL
  Filled 2016-03-28: qty 2
  Filled 2016-03-28: qty 3
  Filled 2016-03-28 (×3): qty 2
  Filled 2016-03-28: qty 3
  Filled 2016-03-28 (×3): qty 6
  Filled 2016-03-28 (×2): qty 2
  Filled 2016-03-28: qty 3
  Filled 2016-03-28 (×2): qty 6
  Filled 2016-03-28: qty 2
  Filled 2016-03-28: qty 6
  Filled 2016-03-28: qty 3
  Filled 2016-03-28: qty 6
  Filled 2016-03-28: qty 2

## 2016-03-28 MED ORDER — DEXTROSE 5 % IV SOLN
1.5000 g | Freq: Two times a day (BID) | INTRAVENOUS | Status: AC
  Administered 2016-03-28 – 2016-03-30 (×4): 1.5 g via INTRAVENOUS
  Filled 2016-03-28 (×4): qty 1.5

## 2016-03-28 MED ORDER — FLUOXETINE HCL 20 MG PO CAPS
20.0000 mg | ORAL_CAPSULE | Freq: Every day | ORAL | Status: DC
  Administered 2016-03-29 – 2016-04-03 (×6): 20 mg via ORAL
  Filled 2016-03-28 (×6): qty 1

## 2016-03-28 MED ORDER — ACETAMINOPHEN 500 MG PO TABS
1000.0000 mg | ORAL_TABLET | Freq: Four times a day (QID) | ORAL | Status: DC
  Administered 2016-03-29 – 2016-04-01 (×13): 1000 mg via ORAL
  Filled 2016-03-28 (×11): qty 2

## 2016-03-28 MED ORDER — LURASIDONE HCL 40 MG PO TABS
120.0000 mg | ORAL_TABLET | Freq: Every day | ORAL | Status: DC
  Administered 2016-03-29 – 2016-04-03 (×6): 120 mg via ORAL
  Filled 2016-03-28 (×6): qty 3

## 2016-03-28 MED ORDER — HEPARIN SODIUM (PORCINE) 1000 UNIT/ML IJ SOLN
INTRAMUSCULAR | Status: DC | PRN
  Administered 2016-03-28: 20000 [IU] via INTRAVENOUS

## 2016-03-28 MED ORDER — METOPROLOL TARTRATE 25 MG/10 ML ORAL SUSPENSION: 12.5000 mg | Freq: Two times a day (BID) | ORAL | Status: DC

## 2016-03-28 MED ORDER — PANTOPRAZOLE SODIUM 40 MG PO TBEC
40.0000 mg | DELAYED_RELEASE_TABLET | Freq: Every day | ORAL | Status: DC
  Administered 2016-03-29 – 2016-04-01 (×4): 40 mg via ORAL
  Filled 2016-03-28 (×4): qty 1

## 2016-03-28 MED ORDER — POTASSIUM CHLORIDE 10 MEQ/50ML IV SOLN
10.0000 meq | INTRAVENOUS | Status: AC
  Administered 2016-03-28 (×3): 10 meq via INTRAVENOUS

## 2016-03-28 MED ORDER — CALCIUM CHLORIDE 10 % IV SOLN
INTRAVENOUS | Status: DC | PRN
  Administered 2016-03-28: 200 mg via INTRAVENOUS

## 2016-03-28 MED ORDER — SODIUM CHLORIDE 0.45 % IV SOLN
INTRAVENOUS | Status: DC | PRN
  Administered 2016-03-28: 13:00:00 via INTRAVENOUS

## 2016-03-28 MED ORDER — ROCURONIUM BROMIDE 100 MG/10ML IV SOLN: INTRAVENOUS | Status: DC | PRN

## 2016-03-28 MED ORDER — ROCURONIUM BROMIDE 10 MG/ML (PF) SYRINGE
PREFILLED_SYRINGE | INTRAVENOUS | Status: AC
  Filled 2016-03-28: qty 10

## 2016-03-28 MED ORDER — DIVALPROEX SODIUM 500 MG PO DR TAB: 1500.0000 mg | DELAYED_RELEASE_TABLET | Freq: Two times a day (BID) | ORAL | Status: DC

## 2016-03-28 MED ORDER — HEMOSTATIC AGENTS (NO CHARGE) OPTIME
TOPICAL | Status: DC | PRN
  Administered 2016-03-28: 3 via TOPICAL

## 2016-03-28 MED ORDER — INSULIN REGULAR BOLUS VIA INFUSION
0.0000 [IU] | Freq: Three times a day (TID) | INTRAVENOUS | Status: DC
  Filled 2016-03-28: qty 10

## 2016-03-28 MED ORDER — VANCOMYCIN HCL IN DEXTROSE 1-5 GM/200ML-% IV SOLN
1000.0000 mg | Freq: Once | INTRAVENOUS | Status: AC
  Administered 2016-03-28: 1000 mg via INTRAVENOUS
  Filled 2016-03-28: qty 200

## 2016-03-28 MED ORDER — MORPHINE SULFATE (PF) 2 MG/ML IV SOLN
1.0000 mg | INTRAVENOUS | Status: AC | PRN
  Administered 2016-03-28: 2 mg via INTRAVENOUS

## 2016-03-28 MED ORDER — PROPOFOL 10 MG/ML IV BOLUS
INTRAVENOUS | Status: DC | PRN
  Administered 2016-03-28 (×2): 50 mg via INTRAVENOUS

## 2016-03-28 MED ORDER — ARTIFICIAL TEARS OP OINT
TOPICAL_OINTMENT | OPHTHALMIC | Status: DC | PRN
  Administered 2016-03-28: 1 via OPHTHALMIC

## 2016-03-28 MED ORDER — LACTATED RINGERS IV SOLN: 500.0000 mL | Freq: Once | INTRAVENOUS | Status: DC | PRN

## 2016-03-28 MED ORDER — CALCIUM CHLORIDE 10 % IV SOLN
INTRAVENOUS | Status: AC
  Filled 2016-03-28: qty 10

## 2016-03-28 MED ORDER — LIDOCAINE 2% (20 MG/ML) 5 ML SYRINGE
INTRAMUSCULAR | Status: AC
  Filled 2016-03-28: qty 5

## 2016-03-28 MED ORDER — HEMOSTATIC AGENTS (NO CHARGE) OPTIME
TOPICAL | Status: DC | PRN
  Administered 2016-03-28: 1 via TOPICAL

## 2016-03-28 MED ORDER — OXYCODONE HCL 5 MG PO TABS
5.0000 mg | ORAL_TABLET | ORAL | Status: DC | PRN
  Administered 2016-03-29: 5 mg via ORAL
  Administered 2016-03-29 (×2): 10 mg via ORAL
  Filled 2016-03-28: qty 2
  Filled 2016-03-28: qty 1
  Filled 2016-03-28: qty 2

## 2016-03-28 MED ORDER — ROCURONIUM BROMIDE 10 MG/ML (PF) SYRINGE
PREFILLED_SYRINGE | INTRAVENOUS | Status: AC
  Filled 2016-03-28: qty 20

## 2016-03-28 MED FILL — Sodium Chloride IV Soln 0.9%: INTRAVENOUS | Qty: 2000 | Status: AC

## 2016-03-28 MED FILL — Heparin Sodium (Porcine) Inj 1000 Unit/ML: INTRAMUSCULAR | Qty: 10 | Status: AC

## 2016-03-28 MED FILL — Mannitol IV Soln 20%: INTRAVENOUS | Qty: 500 | Status: AC

## 2016-03-28 MED FILL — Potassium Chloride Inj 2 mEq/ML: INTRAVENOUS | Qty: 40 | Status: AC

## 2016-03-28 MED FILL — Electrolyte-R (PH 7.4) Solution: INTRAVENOUS | Qty: 3000 | Status: AC

## 2016-03-28 MED FILL — Sodium Bicarbonate IV Soln 8.4%: INTRAVENOUS | Qty: 50 | Status: AC

## 2016-03-28 MED FILL — Lidocaine HCl IV Inj 20 MG/ML: INTRAVENOUS | Qty: 5 | Status: AC

## 2016-03-28 MED FILL — Heparin Sodium (Porcine) Inj 1000 Unit/ML: INTRAMUSCULAR | Qty: 30 | Status: AC

## 2016-03-28 MED FILL — Magnesium Sulfate Inj 50%: INTRAMUSCULAR | Qty: 10 | Status: AC

## 2016-03-28 SURGICAL SUPPLY — 103 items
ADH SKN CLS APL DERMABOND .7 (GAUZE/BANDAGES/DRESSINGS) ×3
BAG DECANTER FOR FLEXI CONT (MISCELLANEOUS) ×4 IMPLANT
BANDAGE ACE 4X5 VEL STRL LF (GAUZE/BANDAGES/DRESSINGS) ×4 IMPLANT
BANDAGE ACE 6X5 VEL STRL LF (GAUZE/BANDAGES/DRESSINGS) ×4 IMPLANT
BASKET HEART (ORDER IN 25'S) (MISCELLANEOUS) ×1
BASKET HEART (ORDER IN 25S) (MISCELLANEOUS) ×3 IMPLANT
BLADE STERNUM SYSTEM 6 (BLADE) ×4 IMPLANT
BLADE SURG 11 STRL SS (BLADE) ×1 IMPLANT
BNDG GAUZE ELAST 4 BULKY (GAUZE/BANDAGES/DRESSINGS) ×4 IMPLANT
CANISTER SUCTION 2500CC (MISCELLANEOUS) ×4 IMPLANT
CATH ROBINSON RED A/P 18FR (CATHETERS) ×8 IMPLANT
CATH THORACIC 28FR (CATHETERS) ×4 IMPLANT
CATH THORACIC 36FR (CATHETERS) ×4 IMPLANT
CATH THORACIC 36FR RT ANG (CATHETERS) ×4 IMPLANT
CLIP TI MEDIUM 24 (CLIP) IMPLANT
CLIP TI WIDE RED SMALL 24 (CLIP) ×1 IMPLANT
CRADLE DONUT ADULT HEAD (MISCELLANEOUS) ×4 IMPLANT
DERMABOND ADVANCED (GAUZE/BANDAGES/DRESSINGS) ×1
DERMABOND ADVANCED .7 DNX12 (GAUZE/BANDAGES/DRESSINGS) IMPLANT
DRAPE CARDIOVASCULAR INCISE (DRAPES) ×4
DRAPE SLUSH/WARMER DISC (DRAPES) ×4 IMPLANT
DRAPE SRG 135X102X78XABS (DRAPES) ×3 IMPLANT
DRSG AQUACEL AG ADV 3.5X14 (GAUZE/BANDAGES/DRESSINGS) ×1 IMPLANT
DRSG COVADERM 4X14 (GAUZE/BANDAGES/DRESSINGS) ×4 IMPLANT
ELECT CAUTERY BLADE 6.4 (BLADE) ×4 IMPLANT
ELECT REM PT RETURN 9FT ADLT (ELECTROSURGICAL) ×8
ELECTRODE REM PT RTRN 9FT ADLT (ELECTROSURGICAL) ×6 IMPLANT
FELT TEFLON 1X6 (MISCELLANEOUS) ×8 IMPLANT
GAUZE SPONGE 4X4 12PLY STRL (GAUZE/BANDAGES/DRESSINGS) ×8 IMPLANT
GLOVE BIO SURGEON STRL SZ 6 (GLOVE) IMPLANT
GLOVE BIO SURGEON STRL SZ 6.5 (GLOVE) ×2 IMPLANT
GLOVE BIO SURGEON STRL SZ7 (GLOVE) ×4 IMPLANT
GLOVE BIO SURGEON STRL SZ7.5 (GLOVE) IMPLANT
GLOVE BIOGEL PI IND STRL 6 (GLOVE) IMPLANT
GLOVE BIOGEL PI IND STRL 6.5 (GLOVE) IMPLANT
GLOVE BIOGEL PI IND STRL 7.0 (GLOVE) IMPLANT
GLOVE BIOGEL PI INDICATOR 6 (GLOVE)
GLOVE BIOGEL PI INDICATOR 6.5 (GLOVE) ×5
GLOVE BIOGEL PI INDICATOR 7.0 (GLOVE)
GLOVE EUDERMIC 7 POWDERFREE (GLOVE) ×8 IMPLANT
GLOVE ORTHO TXT STRL SZ7.5 (GLOVE) IMPLANT
GOWN STRL REUS W/ TWL LRG LVL3 (GOWN DISPOSABLE) ×12 IMPLANT
GOWN STRL REUS W/ TWL XL LVL3 (GOWN DISPOSABLE) ×3 IMPLANT
GOWN STRL REUS W/TWL LRG LVL3 (GOWN DISPOSABLE) ×16
GOWN STRL REUS W/TWL XL LVL3 (GOWN DISPOSABLE) ×20
HEMOSTAT POWDER SURGIFOAM 1G (HEMOSTASIS) ×12 IMPLANT
HEMOSTAT SURGICEL 2X14 (HEMOSTASIS) ×4 IMPLANT
INSERT FOGARTY 61MM (MISCELLANEOUS) IMPLANT
INSERT FOGARTY XLG (MISCELLANEOUS) IMPLANT
KIT BASIN OR (CUSTOM PROCEDURE TRAY) ×4 IMPLANT
KIT CATH CPB BARTLE (MISCELLANEOUS) ×4 IMPLANT
KIT ROOM TURNOVER OR (KITS) ×4 IMPLANT
KIT SUCTION CATH 14FR (SUCTIONS) ×4 IMPLANT
KIT VASOVIEW 6 PRO VH 2400 (KITS) ×4 IMPLANT
NS IRRIG 1000ML POUR BTL (IV SOLUTION) ×20 IMPLANT
PACK OPEN HEART (CUSTOM PROCEDURE TRAY) ×4 IMPLANT
PAD ARMBOARD 7.5X6 YLW CONV (MISCELLANEOUS) ×8 IMPLANT
PAD ELECT DEFIB RADIOL ZOLL (MISCELLANEOUS) ×4 IMPLANT
PENCIL BUTTON HOLSTER BLD 10FT (ELECTRODE) ×4 IMPLANT
PUNCH AORTIC ROTATE 4.0MM (MISCELLANEOUS) IMPLANT
PUNCH AORTIC ROTATE 4.5MM 8IN (MISCELLANEOUS) ×4 IMPLANT
PUNCH AORTIC ROTATE 5MM 8IN (MISCELLANEOUS) IMPLANT
SET CARDIOPLEGIA MPS 5001102 (MISCELLANEOUS) ×1 IMPLANT
SPONGE GAUZE 4X4 12PLY STER LF (GAUZE/BANDAGES/DRESSINGS) ×1 IMPLANT
SPONGE INTESTINAL PEANUT (DISPOSABLE) IMPLANT
SPONGE LAP 18X18 X RAY DECT (DISPOSABLE) IMPLANT
SPONGE LAP 4X18 X RAY DECT (DISPOSABLE) ×4 IMPLANT
SUT BONE WAX W31G (SUTURE) ×4 IMPLANT
SUT MNCRL AB 4-0 PS2 18 (SUTURE) ×1 IMPLANT
SUT PROLENE 3 0 SH DA (SUTURE) IMPLANT
SUT PROLENE 3 0 SH1 36 (SUTURE) ×4 IMPLANT
SUT PROLENE 4 0 RB 1 (SUTURE)
SUT PROLENE 4 0 SH DA (SUTURE) IMPLANT
SUT PROLENE 4-0 RB1 .5 CRCL 36 (SUTURE) IMPLANT
SUT PROLENE 5 0 C 1 36 (SUTURE) IMPLANT
SUT PROLENE 6 0 C 1 30 (SUTURE) IMPLANT
SUT PROLENE 7 0 BV 1 (SUTURE) IMPLANT
SUT PROLENE 7 0 BV1 MDA (SUTURE) ×5 IMPLANT
SUT PROLENE 8 0 BV175 6 (SUTURE) IMPLANT
SUT SILK  1 MH (SUTURE) ×3
SUT SILK 1 MH (SUTURE) IMPLANT
SUT STEEL 6MS V (SUTURE) ×2 IMPLANT
SUT STEEL STERNAL CCS#1 18IN (SUTURE) IMPLANT
SUT STEEL SZ 6 DBL 3X14 BALL (SUTURE) IMPLANT
SUT VIC AB 1 CTX 36 (SUTURE) ×8
SUT VIC AB 1 CTX36XBRD ANBCTR (SUTURE) ×6 IMPLANT
SUT VIC AB 2-0 CT1 27 (SUTURE) ×4
SUT VIC AB 2-0 CT1 TAPERPNT 27 (SUTURE) IMPLANT
SUT VIC AB 2-0 CTX 27 (SUTURE) IMPLANT
SUT VIC AB 3-0 SH 27 (SUTURE)
SUT VIC AB 3-0 SH 27X BRD (SUTURE) IMPLANT
SUT VIC AB 3-0 X1 27 (SUTURE) IMPLANT
SUT VICRYL 4-0 PS2 18IN ABS (SUTURE) ×1 IMPLANT
SUTURE E-PAK OPEN HEART (SUTURE) ×4 IMPLANT
SYSTEM SAHARA CHEST DRAIN ATS (WOUND CARE) ×4 IMPLANT
TAPE CLOTH SURG 4X10 WHT LF (GAUZE/BANDAGES/DRESSINGS) ×1 IMPLANT
TAPE PAPER 3X10 WHT MICROPORE (GAUZE/BANDAGES/DRESSINGS) ×1 IMPLANT
TOWEL OR 17X24 6PK STRL BLUE (TOWEL DISPOSABLE) ×4 IMPLANT
TOWEL OR 17X26 10 PK STRL BLUE (TOWEL DISPOSABLE) ×4 IMPLANT
TRAY FOLEY IC TEMP SENS 16FR (CATHETERS) ×4 IMPLANT
TUBING INSUFFLATION (TUBING) ×4 IMPLANT
UNDERPAD 30X30 (UNDERPADS AND DIAPERS) ×4 IMPLANT
WATER STERILE IRR 1000ML POUR (IV SOLUTION) ×8 IMPLANT

## 2016-03-28 NOTE — Progress Notes (Signed)
  Echocardiogram Echocardiogram Transesophageal has been performed.  Jennette Dubin 03/28/2016, 8:31 AM

## 2016-03-28 NOTE — Progress Notes (Signed)
Patient ID: Melvin Taylor, male   DOB: 12/28/1965, 50 y.o.   MRN: [id number] EVENING ROUNDS NOTE :     [address]       [place] [address]             [phone]                 Day of Surgery Procedure(s) (LRB): CORONARY ARTERY BYPASS GRAFTING (CABG) x 5 USING GREATER SAPHENOUS VEIN (N/A) TRANSESOPHAGEAL ECHOCARDIOGRAM (TEE) (N/A) ENDOVEIN HARVEST OF GREATER SAPHENOUS VEIN (Right)  Total Length of Stay:  LOS: 0 days  BP 100/81   Pulse 90   Temp 98.8 F (37.1 C)   Resp (!) 25   SpO2 100%   .Intake/Output      08/27 0701 - 08/28 0700 08/28 0701 - 08/29 0700   I.V.  1863.3   Blood  235   NG/GT  60   IV Piggyback  645   Total Intake   2803.3   Urine  3245   Emesis/NG output  60   Blood  500   Chest Tube  290   Total Output   4095   Net   -1291.7          . sodium chloride 20 mL/hr at 03/28/16 1300  . [START ON 03/29/2016] sodium chloride    . sodium chloride 20 mL/hr at 03/28/16 1419  . dexmedetomidine Stopped (03/28/16 1520)  . insulin (NOVOLIN-R) infusion 1.1 Units/hr (03/28/16 1730)  . lactated ringers 20 mL/hr at 03/28/16 1300  . lactated ringers 20 mL/hr at 03/28/16 1300  . nitroGLYCERIN Stopped (03/28/16 1540)  . phenylephrine (NEO-SYNEPHRINE) Adult infusion 20 mcg/min (03/28/16 1800)     Lab Results  Component Value Date   WBC 5.9 03/28/2016   HGB 9.9 (L) 03/28/2016   HCT 29.0 (L) 03/28/2016   PLT 63 (L) 03/28/2016   GLUCOSE 104 (H) 03/28/2016   CHOL 120 03/22/2016   TRIG 37 03/22/2016   HDL 60 03/22/2016   LDLCALC 53 03/22/2016   ALT 15 (L) 03/24/2016   AST 22 03/24/2016   NA 140 03/28/2016   K 3.9 03/28/2016   CL 97 (L) 03/28/2016   CREATININE 0.60 (L) 03/28/2016   BUN 14 03/28/2016   CO2 20 (L) 03/24/2016   TSH 0.58 10/19/2015   INR 1.53 03/28/2016   HGBA1C 10.4 (H) 03/24/2016   Now extubated, some nausea, not bleeding   Grace Isaac MD  Beeper [phone] Office [phone] 03/28/2016 6:33 PM

## 2016-03-28 NOTE — Interval H&P Note (Signed)
History and Physical Interval Note:  03/28/2016 5:43 AM  Melvin Taylor  has presented today for surgery, with the diagnosis of CAD  The various methods of treatment have been discussed with the patient and family. After consideration of risks, benefits and other options for treatment, the patient has consented to  Procedure(s): CORONARY ARTERY BYPASS GRAFTING (CABG) (N/A) TRANSESOPHAGEAL ECHOCARDIOGRAM (TEE) (N/A) as a surgical intervention .  The patient's history has been reviewed, patient examined, no change in status, stable for surgery.  I have reviewed the patient's chart and labs.  Questions were answered to the patient's satisfaction.     [name]

## 2016-03-28 NOTE — Brief Op Note (Addendum)
03/28/2016  11:07 AM  PATIENT:  Melvin Taylor  50 y.o. male  PRE-OPERATIVE DIAGNOSIS:  Coronary artery disease  POST-OPERATIVE DIAGNOSIS:  Coronary artery disease  PROCEDURE:  TRANSESOPHAGEAL ECHOCARDIOGRAM (TEE) MEDIAN STERNOTOMY for CORONARY ARTERY BYPASS GRAFTING (CABG) x 5 (LIMA to LAD, SVG SEQUENTIALLY to PROXIMAL AND MID CIRCUMFLEX, SVG to Diagnonal, and SVG to RCA) USING RIGHT GREATER SAPHENOUS VEIN VIA ENDOVEIN HARVEST and LEFT INTERNAL MAMMARY ARTERY HARVEST  SURGEON:  Surgeon(s) and Role:    * [name], MD - Primary  PHYSICIAN ASSISTANT: Lars Pinks PA-C  ANESTHESIA:   general  EBL:  Total I/O In: -  Out: [id number] [Urine:1275]  DRAINS: Chest tubes placed in the mediastinal and pleural spaces   COUNTS CORRECT:  YES  DICTATION: .Dragon Dictation  PLAN OF CARE: Admit to inpatient   PATIENT DISPOSITION:  ICU - intubated and hemodynamically stable.   Delay start of Pharmacological VTE agent (>24hrs) due to surgical blood loss or risk of bleeding: yes  BASELINE WEIGHT: 65 kg

## 2016-03-28 NOTE — Anesthesia Preprocedure Evaluation (Signed)
Anesthesia Evaluation  Patient identified by MRN, date of birth, ID band Patient awake    Reviewed: Allergy & Precautions, H&P , NPO status , Patient's Chart, lab work & pertinent test results  History of Anesthesia Complications Negative for: history of anesthetic complications  Airway Mallampati: II  TM Distance: >3 FB Neck ROM: full    Dental no notable dental hx.    Pulmonary sleep apnea and Continuous Positive Airway Pressure Ventilation , Current Smoker,    Pulmonary exam normal breath sounds clear to auscultation       Cardiovascular hypertension, Pt. on medications + CAD, + Past MI and + Peripheral Vascular Disease  Normal cardiovascular exam Rhythm:regular Rate:Normal     Neuro/Psych  Headaches, Seizures -,  PSYCHIATRIC DISORDERS Anxiety Depression Bipolar Disorder Schizophrenia    GI/Hepatic negative GI ROS, Neg liver ROS,   Endo/Other  diabetes, Poorly Controlled, Type 2, Insulin Dependent  Renal/GU negative Renal ROS     Musculoskeletal   Abdominal   Peds  Hematology negative hematology ROS (+)   Anesthesia Other Findings Long history of medical noncompliance, continues to smoke, takes chronic opioids, seizure prophylaxis with keppra and depakote, insulin dependent diabetic, OSA but with CPAP use only sporadically   Reproductive/Obstetrics negative OB ROS                             Anesthesia Physical  Anesthesia Plan  ASA: IV  Anesthesia Plan: General   Post-op Pain Management:    Induction: Intravenous  Airway Management Planned: Oral ETT and Video Laryngoscope Planned  Additional Equipment: Arterial line, CVP, TEE, PA Cath and Ultrasound Guidance Line Placement  Intra-op Plan:   Post-operative Plan: Post-operative intubation/ventilation  Informed Consent: I have reviewed the patients History and Physical, chart, labs and discussed the procedure including the  risks, benefits and alternatives for the proposed anesthesia with the patient or authorized representative who has indicated his/her understanding and acceptance.   Dental Advisory Given  Plan Discussed with: Anesthesiologist, CRNA and Surgeon  Anesthesia Plan Comments:         Anesthesia Quick Evaluation

## 2016-03-28 NOTE — Anesthesia Postprocedure Evaluation (Signed)
Anesthesia Post Note  Patient: Melvin Taylor  Procedure(s) Performed: Procedure(s) (LRB): CORONARY ARTERY BYPASS GRAFTING (CABG) x 5 USING GREATER SAPHENOUS VEIN (N/A) TRANSESOPHAGEAL ECHOCARDIOGRAM (TEE) (N/A) ENDOVEIN HARVEST OF GREATER SAPHENOUS VEIN (Right)  Patient location during evaluation: SICU Anesthesia Type: General Level of consciousness: sedated Pain management: pain level controlled Vital Signs Assessment: post-procedure vital signs reviewed and stable Respiratory status: patient remains intubated per anesthesia plan Cardiovascular status: stable Anesthetic complications: no Comments: Patient stable on nitroglycerin after surgery, DDAVp given after protamine.. Hgb was acceptable coming off bypass.. EF preserved with no need for inotropic therapy initially    Last Vitals:  Vitals:   03/28/16 1400 03/28/16 1415  BP: 95/69   Pulse:    Resp: 18 (!) 24  Temp: 36.5 C 36.3 C    Last Pain:  Vitals:   03/28/16 1400  TempSrc: Core (Comment)  PainSc:                  Zenaida Deed

## 2016-03-28 NOTE — Anesthesia Procedure Notes (Signed)
Procedure Name: Intubation Date/Time: 03/28/2016 7:47 AM Performed by: Merrilyn Puma B Pre-anesthesia Checklist: Patient identified, Emergency Drugs available, Suction available, Patient being monitored and Timeout performed Patient Re-evaluated:Patient Re-evaluated prior to inductionOxygen Delivery Method: Circle system utilized Preoxygenation: Pre-oxygenation with 100% oxygen Intubation Type: IV induction Ventilation: Mask ventilation without difficulty Laryngoscope Size: Mac and 4 Grade View: Grade I Tube type: Oral Tube size: 8.0 mm Number of attempts: 1 Airway Equipment and Method: Stylet Placement Confirmation: ETT inserted through vocal cords under direct vision,  positive ETCO2,  CO2 detector and breath sounds checked- equal and bilateral Secured at: 23 cm Tube secured with: Tape Dental Injury: Teeth and Oropharynx as per pre-operative assessment

## 2016-03-28 NOTE — Anesthesia Procedure Notes (Signed)
Procedures

## 2016-03-28 NOTE — H&P (Signed)
[address]       [place] [address]             [phone]      Cardiothoracic Surgery History and Physical    Reason for Admission: Severe multi-vessel coronary artery disease Referring Physician: Dr. Virgel Gess Dayhoff is an 50 y.o. male.  HPI:   The patient is a 50 year old gentleman with poorly controlled DM, hypertension, hyperlipidemia, a history of smoking until recently, a family history of CAD and bipolar disorder with schizophrenia who presented earlier this month with chest pain. This reportedly has been occurring for several months with exertion such as walking long distances and lifting heavy objects. He was reportedly admitted to [facility] with a positive troponin and started on Plavix and Atenolol. He was seen by Dr. Jacinta Shoe and underwent cath on 8/7 by Dr. Tamala Julian which showed severe multi-vessel coronary disease with total occlusion of the mid RCA filling by left to right collaterals, diffuse 75% mid LAD stenosis and 65% + ostial LCX stenosis with an EF of 55%. He was started on medical treatment with Imdur added to his other meds. He was admitted yesterday with recurrent severe chest pain with exertion. This was associated with radiation to the left arm and SOB, diaphoresis. ECG was unremarkable and troponin has been negative x 3. Chest pain resolved after two SL NTG.      Past Medical History:  Diagnosis Date  . Anxiety   . Bipolar 1 disorder ([place])   . Chest pain   . Chronic back pain   . Coronary artery disease   . Depression   . Diabetes mellitus    Type II  . Hallucinations    "long history of them"  . Headache(784.0)   . History of gout   . HTN (hypertension)   . Insomnia   . Neuropathy ([place])   . Pain management   . Schizophrenia ([place])   . Seizures ([place])    last sz between July 5-9th, 2016; epilepsy  . Shortness of breath dyspnea    with exertion  . Sleep apnea   . Transfusion of  blood product refused for religious reason     Past Surgical History:  Procedure Laterality Date  . ANTERIOR CERVICAL DECOMP/DISCECTOMY FUSION N/A 12/14/2015   Procedure: ANTERIOR CERVICAL DECOMPRESSION/DISCECTOMY FUSION CERVICAL FIVE -SIX;  Surgeon: Earnie Larsson, MD;  Location: [facility];  Service: Neurosurgery;  Laterality: N/A;  . BIOPSY  05/07/2015   Procedure: BIOPSY (Gastric);  Surgeon: [name], MD;  Location: [facility];  Service: Endoscopy;;  . CARDIAC CATHETERIZATION N/A 03/07/2016   Procedure: Left Heart Cath and Coronary Angiography;  Surgeon: Belva Crome, MD;  Location: [facility];  Service: Cardiovascular;  Laterality: N/A;  . COLONOSCOPY WITH PROPOFOL N/A 05/07/2015   [id number] diverticulosis, multiple colon polyps removed, tubular adenoma, serrated colon polyp. Next colonoscopy October 2019  . ESOPHAGEAL DILATION N/A 05/07/2015   Procedure: ESOPHAGEAL DILATION WITH 56FR MALONEY DILATOR;  Surgeon: [name], MD;  Location: [facility];  Service: Endoscopy;  Laterality: N/A;  . ESOPHAGOGASTRODUODENOSCOPY (EGD) WITH PROPOFOL N/A 05/07/2015   RMR: Status post dilation of normal esophagus. Gastritis.  Marland Kitchen KNEE SURGERY Left    arthroscopy  . MANDIBLE FRACTURE SURGERY    . POLYPECTOMY  05/07/2015   Procedure: POLYPECTOMY (Hepatic Flexure, Distal Transverse Colon, Rectal);  Surgeon: [name], MD;  Location: [facility];  Service: Endoscopy;;  .  TONSILLECTOMY           Family History  Problem Relation Age of Onset  . Diabetes Father   . Hypertension Father   . Arthritis    . Diabetes    . Asthma    . Stroke Sister     Social History:  reports that he has been smoking Cigarettes.  He has a 30.00 pack-year smoking history. He has never used smokeless tobacco. He reports that he drinks alcohol. He reports that he uses drugs, including Marijuana.  Allergies:      Allergies  Allergen Reactions  . Aspirin Swelling     Medications:  I have reviewed the patient's current medications. Prior to Admission:         Prescriptions Prior to Admission  Medication Sig Dispense Refill Last Dose  . ALPRAZolam (XANAX) 1 MG tablet Take 1 mg by mouth 2 (two) times daily.   03/20/2016 at Unknown time  . amLODipine (NORVASC) 10 MG tablet Take 10 mg by mouth daily.   03/21/2016 at Unknown time  . atenolol (TENORMIN) 100 MG tablet Take 100 mg by mouth daily.     03/21/2016 at 0900  . atorvastatin (LIPITOR) 40 MG tablet Take 1 tablet (40 mg total) by mouth daily. 90 tablet 0 03/21/2016 at Unknown time  . cephALEXin (KEFLEX) 500 MG capsule Take 1 capsule (500 mg total) by mouth 2 (two) times daily. 14 capsule 0 03/21/2016 at Unknown time  . clopidogrel (PLAVIX) 75 MG tablet Take 75 mg by mouth daily.   03/21/2016 at Unknown time  . divalproex (DEPAKOTE) 500 MG DR tablet Take 3 tablets (1,500 mg total) by mouth 2 (two) times daily. 540 tablet 4 03/21/2016 at Unknown time  . FLUoxetine (PROZAC) 20 MG capsule Take 20 mg by mouth daily.   03/20/2016 at Unknown time  . glucose blood (ACCU-CHEK AVIVA) test strip Use to test 4 times a day. E11.65 150 each 5 03/21/2016 at Unknown time  . HYDROcodone-acetaminophen (NORCO) 7.5-325 MG tablet Take 1 tablet by mouth every 6 (six) hours as needed for moderate pain.   Past Week at Unknown time  . insulin aspart (NOVOLOG) 100 UNIT/ML FlexPen Inject 5-11 Units into the skin 3 (three) times daily with meals. 5 pen 2 Past Week at Unknown time  . Insulin Glargine (LANTUS SOLOSTAR [place]) Inject 30 Units into the skin at bedtime.   03/20/2016 at Unknown time  . isosorbide mononitrate (IMDUR) 60 MG 24 hr tablet Take 1 tablet (60 mg total) by mouth daily. 30 tablet 5 03/21/2016 at Unknown time  . levETIRAcetam (KEPPRA) 500 MG tablet Take 2 tablets (1,000 mg total) by mouth 2 (two) times daily. (Patient taking differently: Take 1,500 mg by mouth 2 (two) times daily. ) 360 tablet 4 03/21/2016 at  Unknown time  . lisinopril-hydrochlorothiazide (PRINZIDE,ZESTORETIC) 20-25 MG tablet Take 1 tablet by mouth daily.   03/21/2016 at Unknown time  . Lurasidone HCl (LATUDA) 60 MG TABS Take 120 mg by mouth daily.    03/20/2016 at Unknown time  . nitroGLYCERIN (NITROSTAT) 0.4 MG SL tablet Place 0.4 mg under the tongue every 5 (five) minutes as needed for chest pain.   03/21/2016 at Unknown time  . potassium chloride (K-DUR) 10 MEQ tablet Take 1 tablet (10 mEq total) by mouth daily. 90 tablet 1 03/21/2016 at Unknown time   Scheduled: . ALPRAZolam  1 mg Oral BID  . amLODipine  10 mg Oral Daily  . atenolol  100 mg Oral Daily  .  atorvastatin  40 mg Oral Daily  . divalproex  1,500 mg Oral BID  . FLUoxetine  20 mg Oral Daily  . hydrochlorothiazide  25 mg Oral Daily  . insulin aspart  0-15 Units Subcutaneous TID WC  . isosorbide mononitrate  90 mg Oral Daily  . levETIRAcetam  1,500 mg Oral BID  . lisinopril  20 mg Oral Daily  . lurasidone  120 mg Oral Daily  . potassium chloride  10 mEq Oral Daily  . sodium chloride flush  3 mL Intravenous Q12H   Continuous: . heparin 800 Units/hr (03/21/16 2329)   PJK:DTOIZT chloride, acetaminophen, HYDROcodone-acetaminophen, nitroGLYCERIN, ondansetron (ZOFRAN) IV, sodium chloride flush    Anti-infectives    None      LabResultsLast48Hours        Results for orders placed or performed during the hospital encounter of 03/21/16 (from the past 48 hour(s))  Basic metabolic panel     Status: Abnormal   Collection Time: 03/21/16  3:02 PM  Result Value Ref Range   Sodium 138 135 - 145 mmol/L   Potassium 4.1 3.5 - 5.1 mmol/L   Chloride 104 101 - 111 mmol/L   CO2 27 22 - 32 mmol/L   Glucose, Bld 214 (H) 65 - 99 mg/dL   BUN 16 6 - 20 mg/dL   Creatinine, Ser 1.03 0.61 - 1.24 mg/dL   Calcium 9.2 8.9 - 10.3 mg/dL   GFR calc non Af Amer >60 >60 mL/min   GFR calc Af Amer >60 >60 mL/min    Comment: (NOTE) The eGFR has been calculated  using the CKD EPI equation. This calculation has not been validated in all clinical situations. eGFR's persistently <60 mL/min signify possible Chronic Kidney Disease.   Anion gap 7 5 - 15  CBC     Status: Abnormal   Collection Time: 03/21/16  3:02 PM  Result Value Ref Range   WBC 6.0 4.0 - 10.5 K/uL   RBC 4.50 4.22 - 5.81 MIL/uL   Hemoglobin 14.0 13.0 - 17.0 g/dL   HCT 40.4 39.0 - 52.0 %   MCV 89.8 78.0 - 100.0 fL   MCH 31.1 26.0 - 34.0 pg   MCHC 34.7 30.0 - 36.0 g/dL   RDW 12.6 11.5 - 15.5 %   Platelets 134 (L) 150 - 400 K/uL  I-stat troponin, ED     Status: None   Collection Time: 03/21/16  3:14 PM  Result Value Ref Range   Troponin i, poc 0.00 0.00 - 0.08 ng/mL   Comment 3            Comment: Due to the release kinetics of cTnI, a negative result within the first hours of the onset of symptoms does not rule out myocardial infarction with certainty. If myocardial infarction is still suspected, repeat the test at appropriate intervals.  Glucose, capillary     Status: Abnormal   Collection Time: 03/21/16  6:44 PM  Result Value Ref Range   Glucose-Capillary 148 (H) 65 - 99 mg/dL   Comment 1 Notify RN    Comment 2 Document in Chart   Protime-INR     Status: None   Collection Time: 03/21/16  7:10 PM  Result Value Ref Range   Prothrombin Time 14.1 11.4 - 15.2 seconds   INR 1.08   Troponin I     Status: None   Collection Time: 03/21/16  7:10 PM  Result Value Ref Range   Troponin I <0.03 <0.03 ng/mL  Glucose, capillary  Status: Abnormal   Collection Time: 03/21/16  9:07 PM  Result Value Ref Range   Glucose-Capillary 218 (H) 65 - 99 mg/dL  Troponin I     Status: None   Collection Time: 03/22/16 12:33 AM  Result Value Ref Range   Troponin I <0.03 <0.03 ng/mL  Glucose, capillary     Status: Abnormal   Collection Time: 03/22/16  6:25 AM  Result Value Ref Range   Glucose-Capillary 147 (H) 65 - 99 mg/dL  Comprehensive metabolic panel      Status: Abnormal   Collection Time: 03/22/16  8:53 AM  Result Value Ref Range   Sodium 140 135 - 145 mmol/L   Potassium 4.0 3.5 - 5.1 mmol/L   Chloride 103 101 - 111 mmol/L   CO2 31 22 - 32 mmol/L   Glucose, Bld 74 65 - 99 mg/dL   BUN 13 6 - 20 mg/dL   Creatinine, Ser 0.94 0.61 - 1.24 mg/dL   Calcium 9.3 8.9 - 10.3 mg/dL   Total Protein 5.8 (L) 6.5 - 8.1 g/dL   Albumin 3.7 3.5 - 5.0 g/dL   AST 15 15 - 41 U/L   ALT 14 (L) 17 - 63 U/L   Alkaline Phosphatase 37 (L) 38 - 126 U/L   Total Bilirubin 1.1 0.3 - 1.2 mg/dL   GFR calc non Af Amer >60 >60 mL/min   GFR calc Af Amer >60 >60 mL/min    Comment: (NOTE) The eGFR has been calculated using the CKD EPI equation. This calculation has not been validated in all clinical situations. eGFR's persistently <60 mL/min signify possible Chronic Kidney Disease.   Anion gap 6 5 - 15  Troponin I     Status: None   Collection Time: 03/22/16  8:53 AM  Result Value Ref Range   Troponin I <0.03 <0.03 ng/mL  CBC     Status: Abnormal   Collection Time: 03/22/16  8:53 AM  Result Value Ref Range   WBC 4.9 4.0 - 10.5 K/uL   RBC 4.68 4.22 - 5.81 MIL/uL   Hemoglobin 14.8 13.0 - 17.0 g/dL   HCT 42.1 39.0 - 52.0 %   MCV 90.0 78.0 - 100.0 fL   MCH 31.6 26.0 - 34.0 pg   MCHC 35.2 30.0 - 36.0 g/dL   RDW 12.6 11.5 - 15.5 %   Platelets 120 (L) 150 - 400 K/uL  Protime-INR     Status: None   Collection Time: 03/22/16  8:53 AM  Result Value Ref Range   Prothrombin Time 14.3 11.4 - 15.2 seconds   INR 1.11   Lipid panel     Status: None   Collection Time: 03/22/16  8:53 AM  Result Value Ref Range   Cholesterol 120 0 - 200 mg/dL   Triglycerides 37 <150 mg/dL   HDL 60 >40 mg/dL   Total CHOL/HDL Ratio 2.0 RATIO   VLDL 7 0 - 40 mg/dL   LDL Cholesterol 53 0 - 99 mg/dL    Comment:        Total Cholesterol/HDL:CHD Risk Coronary Heart Disease Risk Table                     Men   Women  1/2 Average Risk   3.4    3.3  Average Risk       5.0   4.4  2 X Average Risk   9.6   7.1  3 X Average Risk  23.4   11.0  Use the calculated Patient Ratio above and the CHD Risk Table to determine the patient's CHD Risk.        ATP III CLASSIFICATION (LDL):  <100     mg/dL   Optimal  100-129  mg/dL   Near or Above                    Optimal  130-159  mg/dL   Borderline  160-189  mg/dL   High  >190     mg/dL   Very High  Heparin level (unfractionated)     Status: None   Collection Time: 03/22/16  8:53 AM  Result Value Ref Range   Heparin Unfractionated 0.34 0.30 - 0.70 IU/mL    Comment:        IF HEPARIN RESULTS ARE BELOW EXPECTED VALUES, AND PATIENT DOSAGE HAS BEEN CONFIRMED, SUGGEST FOLLOW UP TESTING OF ANTITHROMBIN III LEVELS.  Glucose, capillary     Status: None   Collection Time: 03/22/16 11:09 AM  Result Value Ref Range   Glucose-Capillary 99 65 - 99 mg/dL       ImagingResults(Last48hours)  Dg Chest 2 View  Result Date: 03/21/2016 CLINICAL DATA:  Two weeks of chest pain, shortness of breath, left shoulder pain; history of coronary artery disease with 2 previous MIs, heavy smoking history, diabetes EXAM: CHEST  2 VIEW COMPARISON:  PA and lateral chest x-ray of March 04, 2016 FINDINGS: The lungs are adequately inflated. There is no focal infiltrate. There is a 2 mm diameter projecting over the lateral aspect of the left 6 rib. This is not been previously demonstrated. The heart and pulmonary vascularity are normal. The mediastinum is normal in width. There is no pleural effusion. There is calcification in the wall of the aortic arch. The patient has undergone previous lower anterior cervical fusion. The thoracic vertebral bodies are preserved in height. IMPRESSION: There is no pneumonia nor CHF. **An incidental finding of potential clinical significance has been found. New 2 mm nodule peripherally in the left upper lobe overlying the lateral aspect of the 6th rib. ** Given the  patient's long history of smoking, chest CT scanning is recommended for further evaluation of this nodule. These results will be called to the ordering clinician or representative by the Radiologist Assistant, and communication documented in the PACS or zVision Dashboard. Electronically Signed   By: David  [place] M.D.   On: 03/21/2016 15:49     Review of Systems  Constitutional: Positive for diaphoresis and malaise/fatigue. Negative for chills, fever and weight loss.  HENT: Negative.   Eyes: Negative.   Respiratory: Positive for shortness of breath. Negative for cough and hemoptysis.   Cardiovascular: Positive for chest pain. Negative for palpitations, orthopnea, leg swelling and PND.  Gastrointestinal: Negative.   Genitourinary: Negative.   Musculoskeletal: Negative.   Skin: Negative.   Neurological: Negative.   Endo/Heme/Allergies: Negative.   Psychiatric/Behavioral:       Bipolar disorder and schizophrenia controlled on medications. Has hallucinations and hears voices if he stops his meds.   Blood pressure 116/79, pulse (!) 54, temperature 97.5 F (36.4 C), temperature source Oral, resp. rate 20, weight 68.2 kg (150 lb 6.4 oz), SpO2 100 %. Physical Exam  Constitutional: He is oriented to person, place, and time. He appears well-developed and well-nourished. No distress.  HENT:  Head: Normocephalic and atraumatic.  Poor dentition with many missing teeth  Eyes: EOM are normal. Pupils are equal, round, and reactive to light.  Neck: Normal range of  motion. Neck supple. No JVD present. No thyromegaly present.  Cardiovascular: Normal rate, regular rhythm, normal heart sounds and intact distal pulses.   No murmur heard. Respiratory: Effort normal and breath sounds normal. No respiratory distress. He has no wheezes.  GI: Soft. Bowel sounds are normal. He exhibits no distension and no mass. There is no tenderness.  Musculoskeletal: Normal range of motion. He exhibits no edema.   Lymphadenopathy:    He has no cervical adenopathy.  Neurological: He is alert and oriented to person, place, and time. He has normal strength. No cranial nerve deficit or sensory deficit.  Skin: Skin is warm and dry.  Psychiatric: He has a normal mood and affect.   Belva Crome, MD (Primary)    Procedures   Left Heart Cath and Coronary Angiography  Conclusion     Ost Cx lesion, 65 %stenosed.  Mid RCA lesion, 100 %stenosed.  Dist RCA lesion, 100 %stenosed.  1st Diag lesion, 70 %stenosed.  1st Sept lesion, 95 %stenosed.  Mid LAD to Dist LAD lesion, 75 %stenosed.  The left ventricular ejection fraction is 50-55% by visual estimate.  The left ventricular systolic function is normal.  LV end diastolic pressure is mildly elevated.   Severe coronary artery disease with total occlusion of the mid RCA filling left-to-right by collaterals. Severe diffuse mid LAD 75% stenosis within a calcified tortuous segment. Moderate eccentric ostial circumflex 65% stenosis.  Overall normal left ventricular function, with upper normal LVEDP, and estimated ejection fraction of 55%.   RECOMMENDATIONS:   Further up-titrate anti-anginal therapy by adding Imdur 60 mg per day.  Aggressive risk factor modification discussed with the patient including better diabetes control and smoking cessation.  If all maximal medical therapy there are still limiting symptoms, I'll recommend consideration of bypass surgery given total occlusion of the RCA, and segmental tortuous mid LAD which would probably be better treated surgical therapy rather than stenting (Allergy to aspirin/tortuosity/calcification/long segment of disease).   Indications   CAD in native artery [I25.10 (ICD-10-CM)]  Angina pectoris ([place]) [I20.9 (ICD-10-CM)]  Procedural Details/Technique   Technical Details The right radial area was sterilely prepped and draped. Intravenous sedation with Versed and fentanyl was  administered. 1% Xylocaine was infiltrated to achieve local analgesia. A double wall stick with an angiocath was utilized to obtain intra-arterial access. The modified Seldinger technique was used to place a 76F " Slender" sheath in the right radial artery. Weight based heparin was administered. Coronary angiography was done using 5 F catheters. Right coronary angiography was performed with a JR4. Left ventricular hemodymic recordings and angiography was done using the JR 4 catheter and hand injection. Left coronary angiography was performed with a JL 3.5 cm.  Hemostasis was achieved using a pneumatic band.  During this procedure the patient is administered a total of Versed 2 mg and Fentanyl 75 mg to achieve and maintain moderate conscious sedation. The patient's heart rate, blood pressure, and oxygen saturation are monitored continuously during the procedure. The period of conscious sedation is 29 minutes, of which I was present face-to-face 100% of this time.   Estimated blood loss <50 mL. .    Coronary Findings   Dominance: Co-dominant  Left Anterior Descending  Mid LAD to Dist LAD lesion, 75% stenosed.  First Diagonal Branch  1st Diag lesion, 70% stenosed.  First Septal Branch  1st Sept lesion, 95% stenosed.  Second Diagonal Branch  Vessel is small in size.  Third Diagonal Branch  Vessel is small in size.  Ramus Intermedius  Vessel is small.  Left Circumflex  Ost Cx lesion, 65% stenosed.  Third Obtuse Marginal Branch  Vessel is small in size.  Right Coronary Artery  Mid RCA lesion, 100% stenosed.  Dist RCA lesion, 100% stenosed.  Right Posterior Descending Artery  RPDA filled by collaterals from Dist LAD.  First Right Posterolateral  1st RPLB filled by collaterals from Dist Cx.  Wall Motion        Resting               Left Heart   Left Ventricle The left ventricular size is normal. The left ventricular systolic function is normal. LV end diastolic pressure is  mildly elevated. The left ventricular ejection fraction is 50-55% by visual estimate. No regional wall motion abnormalities.    Coronary Diagrams   Diagnostic Diagram     Implants        No implant documentation for this case.  PACS Images   Show images for Cardiac catheterization   Link to Procedure Log   Procedure Log    Hemo Data   Flowsheet Row Most Recent Value  AO Systolic Pressure 670 mmHg  AO Diastolic Pressure 70 mmHg  AO Mean 86 mmHg  LV Systolic Pressure 141 mmHg  LV Diastolic Pressure 3 mmHg  LV EDP 18 mmHg  Arterial Occlusion Pressure Extended Systolic Pressure 030 mmHg  Arterial Occlusion Pressure Extended Diastolic Pressure 66 mmHg  Arterial Occlusion Pressure Extended Mean Pressure 82 mmHg  Left Ventricular Apex Extended Systolic Pressure 131 mmHg  Left Ventricular Apex Extended Diastolic Pressure 7 mmHg  Left Ventricular Apex Extended EDP Pressure 18 mmHg    *[facility] [place]* [address], [place] [address] [phone]  ------------------------------------------------------------------- Transthoracic Echocardiography  Patient: Pistol, Kessenich MR #: [id number] Study Date: 03/09/2016 Gender: M Age: 85 Height: 170.2 cm Weight: 66.2 kg BSA: 1.77 m^2 Pt. Status: Room:  [place], MD [place], MD [place], MD PERFORMING [name] SONOGRAPHER Alvino Chapel, RCS  cc:  ------------------------------------------------------------------- LV EF: 60% - 65%  ------------------------------------------------------------------- Indications: Chest pain 786.51.  ------------------------------------------------------------------- History: PMH: NSTEMI. Risk factors: Hypertension.  Diabetes mellitus.  ------------------------------------------------------------------- Study Conclusions  - Left ventricle: The cavity size was normal. Wall thickness was increased in a pattern of moderate LVH. Systolic function was normal. The estimated ejection fraction was in the range of 60% to 65%. Wall motion was normal; there were no regional wall motion abnormalities. Left ventricular diastolic function parameters were normal. - Aortic valve: Valve area (VTI): 3.35 cm^2. Valve area (Vmax): 3.5 cm^2. Valve area (Vmean): 3.23 cm^2. - Mitral valve: Mildly calcified annulus. - Atrial septum: No defect or patent foramen ovale was identified. - Technically adequate study.  ------------------------------------------------------------------- Labs, prior tests, procedures, and surgery: S/P Cardiac Cath.  ------------------------------------------------------------------- Study data: No prior study was available for comparison. Study status: Routine. Procedure: The patient reported no pain pre or post test. Transthoracic echocardiography. Image quality was adequate. Study completion: There were no complications. Transthoracic echocardiography. M-mode, complete 2D, spectral Doppler, and color Doppler. Birthdate: Patient birthdate: [birth year]. Age: Patient is 50 yr old. Sex: Gender: male. BMI: 22.9 kg/m^2. Blood pressure: 148/91 Patient status: Inpatient. Study date: Study date: 03/09/2016. Study time: 09:32 AM. Location: Echo laboratory.  -------------------------------------------------------------------  ------------------------------------------------------------------- Left ventricle: The cavity size was normal. Wall thickness was increased in a pattern of moderate LVH. Systolic function was normal. The estimated ejection fraction was in the range of 60% to 65%.  Wall motion was normal; there were no regional wall  motion abnormalities. Left ventricular diastolic function parameters were normal.  ------------------------------------------------------------------- Aortic valve: Trileaflet; normal thickness leaflets. Doppler: There was no stenosis. There was no significant regurgitation. VTI ratio of LVOT to aortic valve: 0.88. Valve area (VTI): 3.35 cm^2. Indexed valve area (VTI): 1.89 cm^2/m^2. Peak velocity ratio of LVOT to aortic valve: 0.92. Valve area (Vmax): 3.5 cm^2. Indexed valve area (Vmax): 1.97 cm^2/m^2. Mean velocity ratio of LVOT to aortic valve: 0.85. Valve area (Vmean): 3.23 cm^2. Indexed valve area (Vmean): 1.82 cm^2/m^2. Mean gradient (S): 3 mm Hg. Peak gradient (S): 6 mm Hg.  ------------------------------------------------------------------- Aorta: Aortic root: The aortic root was normal in size.  ------------------------------------------------------------------- Mitral valve: Mildly calcified annulus. Doppler: There was no evidence for stenosis. There was no significant regurgitation. Peak gradient (D): 2 mm Hg.  ------------------------------------------------------------------- Left atrium: The atrium was normal in size.  ------------------------------------------------------------------- Atrial septum: No defect or patent foramen ovale was identified.  ------------------------------------------------------------------- Right ventricle: The cavity size was normal. Wall thickness was normal. Systolic function was normal.  ------------------------------------------------------------------- Pulmonic valve: Not well visualized. Doppler: There was no evidence for stenosis. There was mild to moderate regurgitation.  ------------------------------------------------------------------- Tricuspid valve: Normal thickness leaflets. Doppler: There was no evidence for stenosis. There was mild  regurgitation.  ------------------------------------------------------------------- Pulmonary artery: Systolic pressure was within the normal range.  ------------------------------------------------------------------- Right atrium: The atrium was normal in size.  ------------------------------------------------------------------- Pericardium: There was no pericardial effusion.  ------------------------------------------------------------------- Systemic veins: Inferior vena cava: The vessel was normal in size. The respirophasic diameter changes were in the normal range (>= 50%), consistent with normal central venous pressure.  ------------------------------------------------------------------- Measurements  Left ventricle Value Reference LV ID, ED, PLAX chordal (L) 41.5 mm 43 - 52 LV ID, ES, PLAX chordal 25.4 mm 23 - 38 LV fx shortening, PLAX chordal 39 % >=29 LV PW thickness, ED 12.4 mm --------- IVS/LV PW ratio, ED 1.02 <=1.3  Ventricular septum Value Reference IVS thickness, ED 12.7 mm ---------  LVOT Value Reference LVOT ID, S 22 mm --------- LVOT area 3.8 cm^2 --------- LVOT peak velocity, S 114 cm/s --------- LVOT mean velocity, S 67.9 cm/s --------- LVOT VTI, S 22.4 cm --------- LVOT peak gradient, S 5 mm Hg ---------  Aortic valve Value Reference Aortic valve peak velocity, S 123.48 cm/s --------- Aortic valve mean velocity, S  79.88 cm/s --------- Aortic valve VTI, S 25.59 cm --------- Aortic mean gradient, S 3 mm Hg --------- Aortic peak gradient, S 6 mm Hg --------- VTI ratio, LVOT/AV 0.88 --------- Aortic valve area, VTI 3.35 cm^2 --------- Aortic valve area/bsa, VTI 1.89 cm^2/m^2 --------- Velocity ratio, peak, LVOT/AV 0.92 --------- Aortic valve area, peak velocity 3.5 cm^2 --------- Aortic valve area/bsa, peak 1.97 cm^2/m^2 --------- velocity Velocity ratio, mean, LVOT/AV 0.85 --------- Aortic valve area, mean velocity 3.23 cm^2 --------- Aortic valve area/bsa, mean 1.82 cm^2/m^2 --------- velocity  Aorta Value Reference Aortic root ID, ED 37 mm ---------  Left atrium Value Reference LA ID, A-P, ES 34 mm --------- LA volume/bsa, S 23.1 ml/m^2 ---------  Mitral valve Value Reference Mitral E-wave peak velocity 77.5 cm/s --------- Mitral A-wave peak velocity 68.3 cm/s --------- Mitral deceleration time (H) 303 ms 150 - 230 Mitral peak gradient, D 2 mm Hg --------- Mitral E/A ratio, peak 1.1 ---------  Pulmonary arteries Value Reference PA pressure, S, DP 17 mm Hg <=30  Right ventricle Value Reference TAPSE 21.7 mm --------- RV s&',  lateral, S 12.1 cm/s ---------  Legend: (L) and (H)  mark values outside specified reference range.  ------------------------------------------------------------------- Prepared and Electronically Authenticated by  Kerry Hough, M.D. 2017-08-09T15:34:29   Assessment/Plan:  I have personally reviewed his medical record, cath and echo studies. He has severe multi-vessel coronary artery disease with recurrent exertional anginal symptoms that have occurred a low level of activity such as walking on level ground. He is on the maximum medical therapy that he will tolerate and still having symptoms. I agree with the need for CABG. He has been off Plavix for a week. He refuses blood transfusion for religious reasons but his last Hgb was 14.3 so he should be ok. He does understand that this is a major surgery with some risk of significant bleeding and that if he has some complication leading to significant bleeding that he could die without transfusion.   I discussed the operative procedure with the patient and family including alternatives, benefits and risks; including but not limited to bleeding,  infection, stroke, myocardial infarction, graft failure, heart block requiring a permanent pacemaker, organ dysfunction, and death.  Lendon Ka Hulgan understands and agrees to proceed.     [name]

## 2016-03-28 NOTE — Procedures (Signed)
Extubation Procedure Note  Patient Details:   Name: Melvin Taylor DOB: 1966/07/23 MRN: [id number]   Airway Documentation:     Evaluation  O2 sats: stable throughout Complications: No apparent complications Patient did tolerate procedure well. Bilateral Breath Sounds: Clear   Yes  Placed on 4l/min Coleman Incentive spirometer instructed NIF-20 [id number]  Revonda Standard 03/28/2016, 4:46 PM

## 2016-03-28 NOTE — Transfer of Care (Signed)
Immediate Anesthesia Transfer of Care Note  Patient: Melvin Taylor  Procedure(s) Performed: Procedure(s): CORONARY ARTERY BYPASS GRAFTING (CABG) x 5 USING GREATER SAPHENOUS VEIN (N/A) TRANSESOPHAGEAL ECHOCARDIOGRAM (TEE) (N/A) ENDOVEIN HARVEST OF GREATER SAPHENOUS VEIN (Right)  Patient Location: SICU  Anesthesia Type:General  Level of Consciousness: Patient remains intubated per anesthesia plan  Airway & Oxygen Therapy: Patient remains intubated per anesthesia plan and Patient placed on Ventilator (see vital sign flow sheet for setting)  Post-op Assessment: Post -op Vital signs reviewed and stable  Post vital signs: Reviewed and stable  Last Vitals:  Vitals:   03/28/16 0638  BP: (!) 127/93  Resp: 18  Temp: 36.8 C    Last Pain:  Vitals:   03/28/16 0635  PainSc: 6        1256- HR 80 pacing, BP 133/78 (103), sats 100% on ventilator  Complications: No apparent anesthesia complications

## 2016-03-28 NOTE — Op Note (Signed)
CARDIOVASCULAR SURGERY OPERATIVE NOTE  03/28/2016  Surgeon:  [name], MD  First Assistant: Lars Pinks,  PA-C   Preoperative Diagnosis:  Severe multi-vessel coronary artery disease   Postoperative Diagnosis:  Same   Procedure:  1. Median Sternotomy 2. Extracorporeal circulation 3.   Coronary artery bypass grafting x 5   Left internal mammary graft to the LAD  SVG to diagonal 1  SVG to RCA  Sequential SVG to mid and distal OM 4.   Endoscopic vein harvest from the right leg   Anesthesia:  General Endotracheal   Clinical History/Surgical Indication:   The patient is a 50 year old gentleman with poorly controlled DM, hypertension, hyperlipidemia, a history of smoking until recently, a family history of CAD and bipolar disorder with schizophrenia who presented earlier this month with chest pain. This reportedly has been occurring for several months with exertion such as walking long distances and lifting heavy objects. He was reportedly admitted to [facility] with a positive troponin and started on Plavix and Atenolol. He was seen by Dr. Jacinta Shoe and underwent cath on 8/7 by Dr. Tamala Julian which showed severe multi-vessel coronary disease with total occlusion of the mid RCA filling by left to right collaterals, diffuse 75% mid LAD stenosis and 65% + ostial LCX stenosis with an EF of 55%. He was started on medical treatment with Imdur added to his other meds. He was admitted yesterday with recurrent severe chest pain with exertion. This was associated with radiation to the left arm and SOB, diaphoresis. ECG was unremarkable and troponin has been negative x 3. Chest pain resolved after two SL NTG.  I have personally reviewed his medical record, cath and echo studies. He has severe multi-vessel coronary artery disease with recurrent exertional anginal symptoms that have  occurred a low level of activity such as walking on level ground. He is on the maximum medical therapy that he will tolerate and still having symptoms. I agree with the need for CABG. He has been off Plavix for a week. He refuses blood transfusion for religious reasons but his last Hgb was 14.3 so he should be ok. He does understand that this is a major surgery with some risk of significant bleeding and that if he has some complication leading to significant bleeding that he could die without transfusion.   I discussed the operative procedure with the patient and family including alternatives, benefits and risks; including but not limited to bleeding, infection, stroke, myocardial infarction, graft failure, heart block requiring a permanent pacemaker, organ dysfunction, and death. Lendon Ka Shaversunderstands and agrees to proceed.    Preparation:  The patient was seen in the preoperative holding area and the correct patient, correct operation were confirmed with the patient after reviewing the medical record and catheterization. The consent was signed by me. Preoperative antibiotics were given. A pulmonary arterial line and radial arterial line were placed by the anesthesia team. The patient was taken back to the  operating room and positioned supine on the operating room table. After being placed under general endotracheal anesthesia by the anesthesia team a foley catheter was placed. The neck, chest, abdomen, and both legs were prepped with betadine soap and solution and draped in the usual sterile manner. A surgical time-out was taken and the correct patient and operative procedure were confirmed with the nursing and anesthesia staff.   Cardiopulmonary Bypass:  A median sternotomy was performed. The pericardium was opened in the midline. Right ventricular function appeared normal. The ascending aorta was of normal size and had no palpable plaque. There were no contraindications to aortic  cannulation or cross-clamping. The patient was fully systemically heparinized and the ACT was maintained > 400 sec. The proximal aortic arch was cannulated with a 20 F aortic cannula for arterial inflow. Venous cannulation was performed via the right atrial appendage using a two-staged venous cannula. An antegrade cardioplegia/vent cannula was inserted into the mid-ascending aorta. Aortic occlusion was performed with a single cross-clamp. Systemic cooling to 32 degrees Centigrade and topical cooling of the heart with iced saline were used. Hyperkalemic antegrade cold blood cardioplegia was used to induce diastolic arrest and was then given at about 20 minute intervals throughout the period of arrest to maintain myocardial temperature at or below 10 degrees centigrade. A temperature probe was inserted into the interventricular septum and an insulating pad was placed in the pericardium.   Left internal mammary harvest:  The left side of the sternum was retracted using the Rultract retractor. The left internal mammary artery was harvested as a pedicle graft. All side branches were clipped. It was a medium-sized vessel of good quality with excellent blood flow. It was ligated distally and divided. It was sprayed with topical papaverine solution to prevent vasospasm.   Endoscopic vein harvest:  The right greater saphenous vein was harvested endoscopically through a 2 cm incision medial to the right knee. It was harvested from the upper thigh to below the knee. It was a medium-sized vein of good quality. The side branches were all ligated with 4-0 silk ties.    Coronary arteries:  The coronary arteries were examined.   LAD:  Large vessel that is diffusely diseased with calcific plaque in the proximal and mid vessel. The D1 is a moderate sized vessel that is also diffusely diseased out to the distal vessel  LCX:  Large OM that is heavily diseased proximally but with no stenosis on cath. There is a  segmental plaque in the mid to distal vessel with 60-70% stenosis on cath and two moderate sized sub-branches beyond that. The distal OM branches are not graftable.  RCA:  Non-dominant vessel.   Grafts:  1. LIMA to the LAD: 2.5 mm. It was sewn end to side using 8-0 prolene continuous suture. 2. SVG to Diagonal:  1.6 mm. It was sewn end to side using 7-0 prolene continuous suture. 3. SVG to RCA:  2 mm. It was sewn end to side using 7-0 prolene continuous suture. 4. Sequential SVG to mid OM:  2.5 mm. It was sewn sequential side to side using 7-0 prolene continuous suture. 5. Sequential SVG to distal OM: 1.75 mm.  It was sewn sequential side to side using 7-0 prolene continuous suture. 6.   The proximal vein graft anastomoses were performed to the mid-ascending aorta using continuous 6-0 prolene suture. Graft markers were placed around the proximal anastomoses.   Completion:  The patient was rewarmed to 37 degrees Centigrade. The clamp was removed  from the LIMA pedicle and there was rapid warming of the septum and return of ventricular fibrillation. The crossclamp was removed with a time of 88 minutes. There was spontaneous return of sinus rhythm. The distal and proximal anastomoses were checked for hemostasis. The position of the grafts was satisfactory. Two temporary epicardial pacing wires were placed on the right atrium and two on the right ventricle. The patient was weaned from CPB without difficulty on no inotropes. CPB time was 106 minutes. Cardiac output was 4 LPM. Heparin was fully reversed with protamine and the aortic and venous cannulas removed. Hemostasis was achieved. Mediastinal and left pleural drainage tubes were placed. The sternum was closed with  #6 stainless steel wires. The fascia was closed with continuous # 1 vicryl suture. The subcutaneous tissue was closed with 2-0 vicryl continuous suture. The skin was closed with 3-0 vicryl subcuticular suture. All sponge, needle, and  instrument counts were reported correct at the end of the case. Dry sterile dressings were placed over the incisions and around the chest tubes which were connected to pleurevac suction. The patient was then transported to the surgical intensive care unit in critical but stable condition.

## 2016-03-29 ENCOUNTER — Inpatient Hospital Stay (HOSPITAL_COMMUNITY): Payer: Medicaid Other

## 2016-03-29 ENCOUNTER — Encounter (HOSPITAL_COMMUNITY): Payer: Self-pay | Admitting: Surgery

## 2016-03-29 LAB — BASIC METABOLIC PANEL
Anion gap: 8 (ref 5–15)
BUN: 13 mg/dL (ref 6–20)
CO2: 23 mmol/L (ref 22–32)
Calcium: 7.7 mg/dL — ABNORMAL LOW (ref 8.9–10.3)
Chloride: 108 mmol/L (ref 101–111)
Creatinine, Ser: 0.99 mg/dL (ref 0.61–1.24)
GFR calc Af Amer: >60 mL/min (ref >60)
GLUCOSE: 110 mg/dL — AB (ref 65–99)
POTASSIUM: 5 mmol/L (ref 3.5–5.1)
SODIUM: 139 mmol/L (ref 135–145)

## 2016-03-29 LAB — CBC
HCT: 31.9 % — ABNORMAL LOW (ref 39.0–52.0)
HEMATOCRIT: 34.8 % — AB (ref 39.0–52.0)
HEMOGLOBIN: 10.9 g/dL — AB (ref 13.0–17.0)
HEMOGLOBIN: 11.8 g/dL — AB (ref 13.0–17.0)
MCH: 30.9 pg (ref 26.0–34.0)
MCH: 30.9 pg (ref 26.0–34.0)
MCHC: 34.2 g/dL (ref 30.0–36.0)
MCHC: 33.9 g/dL (ref 30.0–36.0)
MCV: 90.4 fL (ref 78.0–100.0)
MCV: 91.1 fL (ref 78.0–100.0)
PLATELETS: 86 10*3/uL — AB (ref 150–400)
Platelets: 86 10*3/uL — ABNORMAL LOW (ref 150–400)
RBC: 3.53 MIL/uL — ABNORMAL LOW (ref 4.22–5.81)
RBC: 3.82 MIL/uL — AB (ref 4.22–5.81)
RDW: 12.8 % (ref 11.5–15.5)
RDW: 12.7 % (ref 11.5–15.5)
WBC: 10.7 10*3/uL — ABNORMAL HIGH (ref 4.0–10.5)
WBC: 12.1 10*3/uL — ABNORMAL HIGH (ref 4.0–10.5)

## 2016-03-29 LAB — GLUCOSE, CAPILLARY
GLUCOSE-CAPILLARY: 102 mg/dL — AB (ref 65–99)
GLUCOSE-CAPILLARY: 108 mg/dL — AB (ref 65–99)
GLUCOSE-CAPILLARY: 109 mg/dL — AB (ref 65–99)
GLUCOSE-CAPILLARY: 112 mg/dL — AB (ref 65–99)
GLUCOSE-CAPILLARY: 117 mg/dL — AB (ref 65–99)
GLUCOSE-CAPILLARY: 146 mg/dL — AB (ref 65–99)
GLUCOSE-CAPILLARY: 84 mg/dL (ref 65–99)
GLUCOSE-CAPILLARY: 96 mg/dL (ref 65–99)
GLUCOSE-CAPILLARY: 98 mg/dL (ref 65–99)
Glucose-Capillary: 105 mg/dL — ABNORMAL HIGH (ref 65–99)
Glucose-Capillary: 119 mg/dL — ABNORMAL HIGH (ref 65–99)
Glucose-Capillary: 244 mg/dL — ABNORMAL HIGH (ref 65–99)
Glucose-Capillary: 194 mg/dL — ABNORMAL HIGH (ref 65–99)

## 2016-03-29 LAB — POCT I-STAT, CHEM 8
BUN: 21 mg/dL — AB (ref 6–20)
CREATININE: 1.2 mg/dL (ref 0.61–1.24)
Calcium, Ion: 1.12 mmol/L — ABNORMAL LOW (ref 1.15–1.40)
Chloride: 100 mmol/L — ABNORMAL LOW (ref 101–111)
GLUCOSE: 247 mg/dL — AB (ref 65–99)
HCT: 35 % — ABNORMAL LOW (ref 39.0–52.0)
HEMOGLOBIN: 11.9 g/dL — AB (ref 13.0–17.0)
POTASSIUM: 4.7 mmol/L (ref 3.5–5.1)
Sodium: 136 mmol/L (ref 135–145)
TCO2: 25 mmol/L (ref 0–100)

## 2016-03-29 LAB — CREATININE, SERUM
Creatinine, Ser: 1.39 mg/dL — ABNORMAL HIGH (ref 0.61–1.24)
GFR calc Af Amer: >60 mL/min (ref >60)
GFR calc non Af Amer: 57 mL/min — ABNORMAL LOW (ref >60)

## 2016-03-29 LAB — MAGNESIUM
Magnesium: 2.1 mg/dL (ref 1.7–2.4)
Magnesium: 2 mg/dL (ref 1.7–2.4)

## 2016-03-29 MED ORDER — PNEUMOCOCCAL VAC POLYVALENT 25 MCG/0.5ML IJ INJ
0.5000 mL | INJECTION | INTRAMUSCULAR | Status: DC
  Filled 2016-03-29: qty 0.5

## 2016-03-29 MED ORDER — INSULIN ASPART 100 UNIT/ML ~~LOC~~ SOLN
0.0000 [IU] | SUBCUTANEOUS | Status: DC
  Administered 2016-03-29: 2 [IU] via SUBCUTANEOUS
  Administered 2016-03-29: 8 [IU] via SUBCUTANEOUS
  Administered 2016-03-29 – 2016-03-30 (×3): 4 [IU] via SUBCUTANEOUS
  Administered 2016-03-31 (×3): 2 [IU] via SUBCUTANEOUS
  Administered 2016-03-31: 4 [IU] via SUBCUTANEOUS
  Administered 2016-03-31 – 2016-04-01 (×2): 8 [IU] via SUBCUTANEOUS
  Administered 2016-04-01: 4 [IU] via SUBCUTANEOUS

## 2016-03-29 MED ORDER — ORAL CARE MOUTH RINSE
15.0000 mL | Freq: Two times a day (BID) | OROMUCOSAL | Status: DC
  Administered 2016-03-29 (×3): 15 mL via OROMUCOSAL

## 2016-03-29 NOTE — Progress Notes (Signed)
1 Day Post-Op Procedure(s) (LRB): CORONARY ARTERY BYPASS GRAFTING (CABG) x 5 USING GREATER SAPHENOUS VEIN (N/A) TRANSESOPHAGEAL ECHOCARDIOGRAM (TEE) (N/A) ENDOVEIN HARVEST OF GREATER SAPHENOUS VEIN (Right) Subjective:  sore  Objective: Vital signs in last 24 hours: Temp:  [97.3 F (36.3 C)-99.3 F (37.4 C)] 99 F (37.2 C) (08/29 0700) Pulse Rate:  [25-101] 89 (08/29 0700) Cardiac Rhythm: Atrial paced (08/29 0400) Resp:  [12-27] 20 (08/29 0700) BP: (72-140)/(56-105) 99/76 (08/29 0700) SpO2:  [55 %-100 %] 100 % (08/29 0700) Arterial Line BP: (87-185)/(52-181) 108/57 (08/29 0700) FiO2 (%):  [40 %-50 %] 40 % (08/28 1600) Weight:  [66.4 kg (146 lb 6.2 oz)] 66.4 kg (146 lb 6.2 oz) (08/29 0500)  Hemodynamic parameters for last 24 hours: PAP: (19-32)/(8-18) 20/9 CO:  [2.4 L/min-4.6 L/min] 3.4 L/min CI:  [1.4 L/min/m2-2.7 L/min/m2] 1.9 L/min/m2  Intake/Output from previous day: 08/28 0701 - 08/29 0700 In: 5026.7 [P.O.:350; I.V.:3136.7; Blood:235; NG/GT:60; IV [id number] Out: [id number] [Urine:4560; Emesis/NG output:60; Blood:500; Chest Tube:620] Intake/Output this shift: No intake/output data recorded.  General appearance: cooperative and slowed mentation Neurologic: intact Heart: regular rate and rhythm, S1, S2 normal, no murmur, click, rub or gallop Lungs: clear to auscultation bilaterally Extremities: extremities normal, atraumatic, no cyanosis or edema Wound: dressing dry  Lab Results:  Recent Labs  03/28/16 1848 03/29/16 0400  WBC 9.2 10.7*  HGB 10.5* 10.9*  HCT 30.7* 31.9*  PLT 80* 86*   BMET:  Recent Labs  03/28/16 1832 03/28/16 1848 03/29/16 0400  NA 143  --  139  K 3.7  --  5.0  CL 105  --  108  CO2  --   --  23  GLUCOSE 103*  --  110*  BUN 13  --  13  CREATININE 0.90 0.99 0.99  CALCIUM  --   --  7.7*    PT/INR:  Recent Labs  03/28/16 1255  LABPROT 18.6*  INR 1.53   ABG    Component Value Date/Time   PHART 7.384 03/28/2016 1736   HCO3 24.3  (H) 03/28/2016 1736   TCO2 24 03/28/2016 1832   ACIDBASEDEF 1.0 03/28/2016 1736   O2SAT 100.0 03/28/2016 1736   CBG (last 3)   Recent Labs  03/29/16 0402 03/29/16 0501 03/29/16 0558  GLUCAP 108* 109* 105*   CLINICAL DATA:  Chest tube present. Coronary artery disease with recent coronary artery bypass grafting  EXAM: PORTABLE CHEST 1 VIEW  COMPARISON:  March 28, 2016  FINDINGS: Endotracheal tube and nasogastric tube have been removed. There are two left chest tubes and a mediastinal drain, unchanged in position. Swan-Ganz catheter tip is in the right main pulmonary artery. No pneumothorax. There is mild atelectasis in the bases. Lungs elsewhere clear. Heart is borderline enlarged with pulmonary vascularity within normal limits. There is atherosclerotic calcification in the aorta. No adenopathy evident.  IMPRESSION: Tube and catheter positions as described without apparent pneumothorax. Mild bibasilar atelectasis. Lungs elsewhere clear. Heart borderline enlarged.   Electronically Signed   By: Lowella Grip III M.D.   On: 03/29/2016 07:08  Assessment/Plan: S/P Procedure(s) (LRB): CORONARY ARTERY BYPASS GRAFTING (CABG) x 5 USING GREATER SAPHENOUS VEIN (N/A) TRANSESOPHAGEAL ECHOCARDIOGRAM (TEE) (N/A) ENDOVEIN HARVEST OF GREATER SAPHENOUS VEIN (Right)  He is hemodynamically stable with SBP 96 off neo. A-paced at 90 to maintain BP. Will hold lopressor for now. Mobilize Diuresis once BP higher Diabetes control: preop Hgb A1c was 10.4 but came off insulin drip quickly overnight. Will continue SSI.  d/c tubes/lines Continue foley due  to patient in ICU and urinary output monitoring See progression orders   LOS: 1 day    [name] 03/29/2016

## 2016-03-29 NOTE — Progress Notes (Signed)
TCTS BRIEF SICU PROGRESS NOTE  1 Day Post-Op  S/P Procedure(s) (LRB): CORONARY ARTERY BYPASS GRAFTING (CABG) x 5 USING GREATER SAPHENOUS VEIN (N/A) TRANSESOPHAGEAL ECHOCARDIOGRAM (TEE) (N/A) ENDOVEIN HARVEST OF GREATER SAPHENOUS VEIN (Right)   Stable day Maintaining AAI paced rhythm w/ stable BP Breathing comfortably w/ O2 sats 97% UOP adequate  Plan: Continue current plan  Rexene Alberts, MD 03/29/2016 5:21 PM

## 2016-03-29 NOTE — Progress Notes (Signed)
Inpatient Diabetes Program Recommendations  AACE/ADA: New Consensus Statement on Inpatient Glycemic Control (2015)  Target Ranges:  Prepandial:   less than 140 mg/dL      Peak postprandial:   less than 180 mg/dL (1-2 hours)      Critically ill patients:  140 - 180 mg/dL   Lab Results  Component Value Date   GLUCAP 117 (H) 03/29/2016   HGBA1C 10.4 (H) 03/24/2016    Review of Glycemic Control:  Results for Melvin Taylor, Melvin Taylor (MRN [id number]) as of 03/29/2016 15:44  Ref. Range 03/29/2016 05:01 03/29/2016 05:58 03/29/2016 06:57 03/29/2016 08:20 03/29/2016 12:36  Glucose-Capillary Latest Ref Range: 65 - 99 mg/dL 109 (H) 105 (H) 119 (H) 112 (H) 117 (H)   Diabetes history: Type 2 diabetes Outpatient Diabetes medications: Novolog 5-11 units tid with  Meals, Lantus 30 units q HS Current orders for Inpatient glycemic control:  Novolog TCTS q 4 hours  Inpatient Diabetes Program Recommendations:    Blood sugars currently controlled.  Will follow.  May need addition of basal insulin once blood sugars greater than 140 mg/dL.  May consider Lantus 15 units q HS (1/2 of home dose).   Thanks, Adah Perl, RN, BC-ADM Inpatient Diabetes Coordinator Pager [phone] (8a-5p)

## 2016-03-30 ENCOUNTER — Ambulatory Visit: Payer: Medicaid Other | Admitting: Diagnostic Neuroimaging

## 2016-03-30 ENCOUNTER — Inpatient Hospital Stay (HOSPITAL_COMMUNITY): Payer: Medicaid Other

## 2016-03-30 LAB — GLUCOSE, CAPILLARY
GLUCOSE-CAPILLARY: 182 mg/dL — AB (ref 65–99)
GLUCOSE-CAPILLARY: 222 mg/dL — AB (ref 65–99)
GLUCOSE-CAPILLARY: 60 mg/dL — AB (ref 65–99)
GLUCOSE-CAPILLARY: 80 mg/dL (ref 65–99)
Glucose-Capillary: 157 mg/dL — ABNORMAL HIGH (ref 65–99)
Glucose-Capillary: 55 mg/dL — ABNORMAL LOW (ref 65–99)
Glucose-Capillary: 56 mg/dL — ABNORMAL LOW (ref 65–99)
Glucose-Capillary: 117 mg/dL — ABNORMAL HIGH (ref 65–99)
Glucose-Capillary: 163 mg/dL — ABNORMAL HIGH (ref 65–99)

## 2016-03-30 LAB — BASIC METABOLIC PANEL
Anion gap: 7 (ref 5–15)
BUN: 23 mg/dL — ABNORMAL HIGH (ref 6–20)
CALCIUM: 7.5 mg/dL — AB (ref 8.9–10.3)
CHLORIDE: 102 mmol/L (ref 101–111)
CO2: 27 mmol/L (ref 22–32)
CREATININE: 1.47 mg/dL — AB (ref 0.61–1.24)
GFR calc non Af Amer: 53 mL/min — ABNORMAL LOW (ref >60)
GLUCOSE: 65 mg/dL (ref 65–99)
Potassium: 4.2 mmol/L (ref 3.5–5.1)
Sodium: 136 mmol/L (ref 135–145)

## 2016-03-30 LAB — CBC
HCT: 29.2 % — ABNORMAL LOW (ref 39.0–52.0)
HEMOGLOBIN: 10.2 g/dL — AB (ref 13.0–17.0)
MCH: 31 pg (ref 26.0–34.0)
MCHC: 34.9 g/dL (ref 30.0–36.0)
MCV: 88.8 fL (ref 78.0–100.0)
Platelets: 75 10*3/uL — ABNORMAL LOW (ref 150–400)
RBC: 3.29 MIL/uL — AB (ref 4.22–5.81)
RDW: 12.8 % (ref 11.5–15.5)
WBC: 11.4 10*3/uL — ABNORMAL HIGH (ref 4.0–10.5)

## 2016-03-30 MED ORDER — MIDODRINE HCL 5 MG PO TABS
10.0000 mg | ORAL_TABLET | Freq: Three times a day (TID) | ORAL | Status: DC
  Administered 2016-03-30 – 2016-04-01 (×8): 10 mg via ORAL
  Filled 2016-03-30 (×9): qty 2

## 2016-03-30 MED ORDER — DEXTROSE 50 % IV SOLN
INTRAVENOUS | Status: AC
  Administered 2016-03-30: 25 mL
  Filled 2016-03-30: qty 50

## 2016-03-30 NOTE — Progress Notes (Signed)
2 Days Post-Op Procedure(s) (LRB): CORONARY ARTERY BYPASS GRAFTING (CABG) x 5 USING GREATER SAPHENOUS VEIN (N/A) TRANSESOPHAGEAL ECHOCARDIOGRAM (TEE) (N/A) ENDOVEIN HARVEST OF GREATER SAPHENOUS VEIN (Right) Subjective:  No complaints  Objective: Vital signs in last 24 hours: Temp:  [96.9 F (36.1 C)-98.4 F (36.9 C)] 96.9 F (36.1 C) (08/30 1100) Pulse Rate:  [89-91] 90 (08/29 1700) Cardiac Rhythm: Atrial paced (08/30 0751) Resp:  [8-23] 17 (08/30 1200) BP: (79-109)/(60-82) 96/75 (08/30 1200) SpO2:  [93 %-100 %] 98 % (08/30 1200) Weight:  [66.4 kg (146 lb 6.2 oz)-68.9 kg (151 lb 14.4 oz)] 68.9 kg (151 lb 14.4 oz) (08/30 0651)  Hemodynamic parameters for last 24 hours:    Intake/Output from previous day: 08/29 0701 - 08/30 0700 In: 1310 [P.O.:780; I.V.:480; IV Piggyback:50] Out: [id number] [Urine:1030] Intake/Output this shift: Total I/O In: 340 [P.O.:300; I.V.:40] Out: 125 [Urine:125]  General appearance: alert and cooperative Neurologic: intact Heart: regular rate and rhythm, S1, S2 normal, no murmur, click, rub or gallop Lungs: clear to auscultation bilaterally Extremities: extremities normal, atraumatic, no cyanosis or edema Wound: dressings dry  Lab Results:  Recent Labs  03/29/16 1650 03/29/16 1654 03/30/16 0352  WBC 12.1*  --  11.4*  HGB 11.8* 11.9* 10.2*  HCT 34.8* 35.0* 29.2*  PLT 86*  --  75*   BMET:  Recent Labs  03/29/16 0400  03/29/16 1654 03/30/16 0352  NA 139  --  136 136  K 5.0  --  4.7 4.2  CL 108  --  100* 102  CO2 23  --   --  27  GLUCOSE 110*  --  247* 65  BUN 13  --  21* 23*  CREATININE 0.99  < > 1.20 1.47*  CALCIUM 7.7*  --   --  7.5*  < > = values in this interval not displayed.  PT/INR:  Recent Labs  03/28/16 1255  LABPROT 18.6*  INR 1.53   ABG    Component Value Date/Time   PHART 7.384 03/28/2016 1736   HCO3 24.3 (H) 03/28/2016 1736   TCO2 25 03/29/2016 1654   ACIDBASEDEF 1.0 03/28/2016 1736   O2SAT 100.0 03/28/2016  1736   CBG (last 3)   Recent Labs  03/30/16 0426 03/30/16 0812 03/30/16 1142  GLUCAP 157* 80 117*    Assessment/Plan: S/P Procedure(s) (LRB): CORONARY ARTERY BYPASS GRAFTING (CABG) x 5 USING GREATER SAPHENOUS VEIN (N/A) TRANSESOPHAGEAL ECHOCARDIOGRAM (TEE) (N/A) ENDOVEIN HARVEST OF GREATER SAPHENOUS VEIN (Right)  His BP was on the low side all day yesterday and still in the low 90's this am.  His Hgb is fine at 10.2. Creat up slightly to 1.47  Will start on Midodrine to support BP. His EF is normal with significant LVH. His psych meds may be responsible for hypotension. Will hold off on beta blocker for now.  Keep in ICU to observe. Mobilize.  Glucose has been labile on SSI.   LOS: 2 days    [name] 03/30/2016

## 2016-03-30 NOTE — Progress Notes (Signed)
CT surgery p.m. Rounds  Blood pressure has been greater than 90 on midodrine Patient sitting up in chair comfortable No complaints

## 2016-03-30 NOTE — Progress Notes (Signed)
Hypoglycemic Event  CBG: 56   Treatment: D50 IV 25 mL  Symptoms: Shaky   Follow-up CBG: 0426      TimeCBG Result:157  Possible Reasons for Event: Inadequate meal intake      Yosmar Ryker, Armando Reichert

## 2016-03-30 NOTE — Plan of Care (Signed)
Problem: Coping: Goal: Ability to adjust to condition or change in health will improve Outcome: Progressing Po mood stabilizers restarted

## 2016-03-30 NOTE — Progress Notes (Signed)
Hypoglycemic Event  CBG: 60    Treatment: 15 GM carbohydrate snack  Symptoms: Shaky  Follow-up CBG: [id number] CBG Result:56  Possible Reasons for Event: Inadequate meal intake  Comments/MD notified:repeat cbg    Jenelle Drennon, Armando Reichert

## 2016-03-30 NOTE — Progress Notes (Addendum)
Inpatient Diabetes Program Recommendations  AACE/ADA: New Consensus Statement on Inpatient Glycemic Control (2015)  Target Ranges:  Prepandial:   less than 140 mg/dL      Peak postprandial:   less than 180 mg/dL (1-2 hours)      Critically ill patients:  140 - 180 mg/dL   Results for DATAVION, BRZOZOWSKI (MRN [id number]) as of 03/30/2016 09:27  Ref. Range 03/29/2016 06:57 03/29/2016 08:20 03/29/2016 12:36 03/29/2016 15:51 03/29/2016 19:21 03/29/2016 23:44 03/30/2016 03:47 03/30/2016 03:53 03/30/2016 04:16 03/30/2016 04:26  Glucose-Capillary Latest Ref Range: 65 - 99 mg/dL 119 (H) 112 (H) 117 (H) 244 (H) 194 (H) 146 (H) 55 (L) 60 (L) 56 (L) 157 (H)   Review of Glycemic Control  Diabetes history: DM2 Outpatient Diabetes medications: Novolog 5-11 units TID with meals, Lantus 30 units QHS Current orders for Inpatient glycemic control: Novolog 0-24 units Q4H  Inpatient Diabetes Program Recommendations: Correction (SSI): Glucose down to 55 mg/dl at 3:47 am today after receiving Novolog 2 units at 23:46 on 03/29/16 for glucose of 146 mg/dl. Since patient is now ordered diet, please consider changing frequency of CBG and Novolog from Q4H to ACHS. Insulin - Meal Coverage: Please consider ordering Novolog 3 units TID with meals for meal coverage if patient eats at least 50% of meals. Diet: Added Carb Modified to Heart Healthy diet. Insulin-Basal: Once glucose is consistently greater than 140 mg/dl, MD may want to consider ordering low dose basal insulin.  Thanks, Barnie Alderman, RN, MSN, CDE Diabetes Coordinator Inpatient Diabetes Program [phone] (Team Pager from [place] to [place]) [phone] ([place] office) [phone] [facility] office) [phone] [facility] office)

## 2016-03-31 ENCOUNTER — Encounter: Payer: Self-pay | Admitting: Diagnostic Neuroimaging

## 2016-03-31 LAB — BASIC METABOLIC PANEL
Anion gap: 4 — ABNORMAL LOW (ref 5–15)
BUN: 33 mg/dL — ABNORMAL HIGH (ref 6–20)
CHLORIDE: 102 mmol/L (ref 101–111)
CO2: 29 mmol/L (ref 22–32)
CREATININE: 1.68 mg/dL — AB (ref 0.61–1.24)
Calcium: 7.7 mg/dL — ABNORMAL LOW (ref 8.9–10.3)
GFR calc non Af Amer: 45 mL/min — ABNORMAL LOW (ref >60)
GFR, EST AFRICAN AMERICAN: 52 mL/min — AB (ref >60)
Glucose, Bld: 136 mg/dL — ABNORMAL HIGH (ref 65–99)
POTASSIUM: 4.1 mmol/L (ref 3.5–5.1)
SODIUM: 135 mmol/L (ref 135–145)

## 2016-03-31 LAB — GLUCOSE, CAPILLARY
GLUCOSE-CAPILLARY: 141 mg/dL — AB (ref 65–99)
GLUCOSE-CAPILLARY: 170 mg/dL — AB (ref 65–99)
GLUCOSE-CAPILLARY: 175 mg/dL — AB (ref 65–99)
Glucose-Capillary: 150 mg/dL — ABNORMAL HIGH (ref 65–99)
Glucose-Capillary: 155 mg/dL — ABNORMAL HIGH (ref 65–99)
Glucose-Capillary: 104 mg/dL — ABNORMAL HIGH (ref 65–99)

## 2016-03-31 MED ORDER — INSULIN DETEMIR 100 UNIT/ML ~~LOC~~ SOLN
10.0000 [IU] | Freq: Every day | SUBCUTANEOUS | Status: DC
  Administered 2016-03-31 – 2016-04-03 (×4): 10 [IU] via SUBCUTANEOUS
  Filled 2016-03-31 (×5): qty 0.1

## 2016-03-31 NOTE — Progress Notes (Signed)
3 Days Post-Op Procedure(s) (LRB): CORONARY ARTERY BYPASS GRAFTING (CABG) x 5 USING GREATER SAPHENOUS VEIN (N/A) TRANSESOPHAGEAL ECHOCARDIOGRAM (TEE) (N/A) ENDOVEIN HARVEST OF GREATER SAPHENOUS VEIN (Right) Subjective:  No complaints  He was I/O cathed last pm for 400 cc after he went 12 hrs without voiding. He says he does not remember if he felt like he needed to void but couldn't. No hx of voiding problems.  Objective: Vital signs in last 24 hours: Temp:  [96.9 F (36.1 C)-98.6 F (37 C)] 98.6 F (37 C) (08/31 0402) Pulse Rate:  [88-90] 89 (08/31 0700) Cardiac Rhythm: Atrial paced (08/30 2000) Resp:  [11-23] 15 (08/31 0700) BP: (83-122)/(57-87) 107/80 (08/31 0700) SpO2:  [91 %-100 %] 99 % (08/31 0700) Weight:  [69.1 kg (152 lb 5.4 oz)] 69.1 kg (152 lb 5.4 oz) (08/31 0500)  Hemodynamic parameters for last 24 hours:    Intake/Output from previous day: 08/30 0701 - 08/31 0700 In: 670 [P.O.:630; I.V.:40] Out: 525 [Urine:525] Intake/Output this shift: No intake/output data recorded.  General appearance: slowed mentation  Neurologic: intact Heart: regular rate and rhythm, S1, S2 normal, no murmur, click, rub or gallop Lungs: clear to auscultation bilaterally Extremities: extremities normal, atraumatic, no cyanosis or edema Wound: incisions ok  Lab Results:  Recent Labs  03/29/16 1650 03/29/16 1654 03/30/16 0352  WBC 12.1*  --  11.4*  HGB 11.8* 11.9* 10.2*  HCT 34.8* 35.0* 29.2*  PLT 86*  --  75*   BMET:  Recent Labs  03/29/16 0400  03/29/16 1654 03/30/16 0352  NA 139  --  136 136  K 5.0  --  4.7 4.2  CL 108  --  100* 102  CO2 23  --   --  27  GLUCOSE 110*  --  247* 65  BUN 13  --  21* 23*  CREATININE 0.99  < > 1.20 1.47*  CALCIUM 7.7*  --   --  7.5*  < > = values in this interval not displayed.  PT/INR:  Recent Labs  03/28/16 1255  LABPROT 18.6*  INR 1.53   ABG    Component Value Date/Time   PHART 7.384 03/28/2016 1736   HCO3 24.3 (H)  03/28/2016 1736   TCO2 25 03/29/2016 1654   ACIDBASEDEF 1.0 03/28/2016 1736   O2SAT 100.0 03/28/2016 1736   CBG (last 3)   Recent Labs  03/30/16 1921 03/30/16 2345 03/31/16 0346  GLUCAP 182* 222* 150*    Assessment/Plan: S/P Procedure(s) (LRB): CORONARY ARTERY BYPASS GRAFTING (CABG) x 5 USING GREATER SAPHENOUS VEIN (N/A) TRANSESOPHAGEAL ECHOCARDIOGRAM (TEE) (N/A) ENDOVEIN HARVEST OF GREATER SAPHENOUS VEIN (Right)  BP seems a little better on Midodrine but still around 100. He is being atrial paced at 80 since underlying rhythm is sinus low 60's. His BP will probably rise some over the next few days on Midodrine.   He has not voided on his own postop. Will reinsert catheter if he can not void after another 12 hrs.  Bipolar schizophrenia: continue his meds as preop.   DM: labile glucose. Will start small dose of Levemir and continue sliding scale.  Continue mobilization and IS.   LOS: 3 days    [name] 03/31/2016

## 2016-03-31 NOTE — Progress Notes (Signed)
[facility] BRIEF SICU PROGRESS NOTE  3 Days Post-Op  S/P Procedure(s) (LRB): CORONARY ARTERY BYPASS GRAFTING (CABG) x 5 USING GREATER SAPHENOUS VEIN (N/A) TRANSESOPHAGEAL ECHOCARDIOGRAM (TEE) (N/A) ENDOVEIN HARVEST OF GREATER SAPHENOUS VEIN (Right)   Stable day  Plan: Continue current plan  Rexene Alberts, MD 03/31/2016 6:04 PM

## 2016-04-01 LAB — GLUCOSE, CAPILLARY
GLUCOSE-CAPILLARY: 67 mg/dL (ref 65–99)
GLUCOSE-CAPILLARY: 221 mg/dL — AB (ref 65–99)
Glucose-Capillary: 101 mg/dL — ABNORMAL HIGH (ref 65–99)
Glucose-Capillary: 91 mg/dL (ref 65–99)
Glucose-Capillary: 111 mg/dL — ABNORMAL HIGH (ref 65–99)
Glucose-Capillary: 95 mg/dL (ref 65–99)

## 2016-04-01 LAB — BASIC METABOLIC PANEL
ANION GAP: 7 (ref 5–15)
BUN: 28 mg/dL — ABNORMAL HIGH (ref 6–20)
CO2: 29 mmol/L (ref 22–32)
Calcium: 7.9 mg/dL — ABNORMAL LOW (ref 8.9–10.3)
Chloride: 101 mmol/L (ref 101–111)
Creatinine, Ser: 1.55 mg/dL — ABNORMAL HIGH (ref 0.61–1.24)
GFR, EST AFRICAN AMERICAN: 58 mL/min — AB (ref >60)
GFR, EST NON AFRICAN AMERICAN: 50 mL/min — AB (ref >60)
Glucose, Bld: 108 mg/dL — ABNORMAL HIGH (ref 65–99)
POTASSIUM: 3.8 mmol/L (ref 3.5–5.1)
SODIUM: 137 mmol/L (ref 135–145)

## 2016-04-01 MED ORDER — INSULIN ASPART 100 UNIT/ML ~~LOC~~ SOLN
6.0000 [IU] | Freq: Three times a day (TID) | SUBCUTANEOUS | Status: DC
  Administered 2016-04-02: 6 [IU] via SUBCUTANEOUS

## 2016-04-01 MED ORDER — BISACODYL 5 MG PO TBEC: 10.0000 mg | DELAYED_RELEASE_TABLET | Freq: Every day | ORAL | Status: DC | PRN

## 2016-04-01 MED ORDER — SODIUM CHLORIDE 0.9% FLUSH: 3.0000 mL | INTRAVENOUS | Status: DC | PRN

## 2016-04-01 MED ORDER — INSULIN ASPART 100 UNIT/ML ~~LOC~~ SOLN
0.0000 [IU] | Freq: Three times a day (TID) | SUBCUTANEOUS | Status: DC
  Administered 2016-04-02: 2 [IU] via SUBCUTANEOUS

## 2016-04-01 MED ORDER — ONDANSETRON HCL 4 MG/2ML IJ SOLN: 4.0000 mg | Freq: Four times a day (QID) | INTRAMUSCULAR | Status: DC | PRN

## 2016-04-01 MED ORDER — TRAMADOL HCL 50 MG PO TABS: 50.0000 mg | ORAL_TABLET | ORAL | Status: DC | PRN

## 2016-04-01 MED ORDER — BISACODYL 10 MG RE SUPP: 10.0000 mg | Freq: Every day | RECTAL | Status: DC | PRN

## 2016-04-01 MED ORDER — SODIUM CHLORIDE 0.9% FLUSH
3.0000 mL | Freq: Two times a day (BID) | INTRAVENOUS | Status: DC
  Administered 2016-04-01 – 2016-04-03 (×3): 3 mL via INTRAVENOUS

## 2016-04-01 MED ORDER — MOVING RIGHT ALONG BOOK
Freq: Once | Status: AC
  Administered 2016-04-01: 1
  Filled 2016-04-01: qty 1

## 2016-04-01 MED ORDER — DOCUSATE SODIUM 100 MG PO CAPS
200.0000 mg | ORAL_CAPSULE | Freq: Every day | ORAL | Status: DC
  Administered 2016-04-02 – 2016-04-03 (×2): 200 mg via ORAL
  Filled 2016-04-01 (×3): qty 2

## 2016-04-01 MED ORDER — TAMSULOSIN HCL 0.4 MG PO CAPS
0.4000 mg | ORAL_CAPSULE | Freq: Every day | ORAL | Status: DC
  Administered 2016-04-01 – 2016-04-03 (×3): 0.4 mg via ORAL
  Filled 2016-04-01 (×3): qty 1

## 2016-04-01 MED ORDER — OXYCODONE HCL 5 MG PO TABS
5.0000 mg | ORAL_TABLET | ORAL | Status: DC | PRN
  Administered 2016-04-01 – 2016-04-03 (×5): 10 mg via ORAL
  Filled 2016-04-01 (×6): qty 2

## 2016-04-01 MED ORDER — FAMOTIDINE 20 MG PO TABS
20.0000 mg | ORAL_TABLET | Freq: Two times a day (BID) | ORAL | Status: DC
  Administered 2016-04-01 – 2016-04-03 (×4): 20 mg via ORAL
  Filled 2016-04-01 (×4): qty 1

## 2016-04-01 MED ORDER — CLOPIDOGREL BISULFATE 75 MG PO TABS
75.0000 mg | ORAL_TABLET | Freq: Every day | ORAL | Status: DC
  Administered 2016-04-01 – 2016-04-03 (×3): 75 mg via ORAL
  Filled 2016-04-01 (×3): qty 1

## 2016-04-01 MED ORDER — SODIUM CHLORIDE 0.9 % IV SOLN: 250.0000 mL | INTRAVENOUS | Status: DC | PRN

## 2016-04-01 MED ORDER — ONDANSETRON HCL 4 MG PO TABS: 4.0000 mg | ORAL_TABLET | Freq: Four times a day (QID) | ORAL | Status: DC | PRN

## 2016-04-01 MED ORDER — ACETAMINOPHEN 325 MG PO TABS: 650.0000 mg | ORAL_TABLET | Freq: Four times a day (QID) | ORAL | Status: DC | PRN

## 2016-04-01 NOTE — Discharge Summary (Signed)
Physician Discharge Summary       [address]       [place] [address]             [phone]    Patient ID: Melvin Taylor MRN: [id number] DOB/AGE: 1966/03/12 50 y.o.  Admit date: 03/28/2016 Discharge date: 04/03/2016  Admission Diagnoses: Coronary artery disease  Active Diagnoses:  1. Diabetes mellitus 2. Hypertension 3. Bipolar 1 disorder ([place]) 4. Chronic back pain 5. Transfusion of blood product refused for religious reason 6. Sleep apnea 7. Seizures ([place]) 8.Schizophrenia ([place]) 9. Neuropathy ([place]) 10.History of gout 11. Headache(784.0) 12. Anxiety 13. ABL anemia 14. Thrombocytopenia  Procedure (s):  1. Median Sternotomy 2. Extracorporeal circulation 3.   Coronary artery bypass grafting x 5   Left internal mammary graft to the LAD  SVG to diagonal 1  SVG to RCA  Sequential SVG to mid and distal OM 4.   Endoscopic vein harvest from the right leg by Dr. Cyndia Bent on 03/28/2016.  History of Presenting Illness: The patient is a 50 year old gentleman with poorly controlled DM, hypertension, hyperlipidemia, a history of smoking until recently, a family history of CAD and bipolar disorder with schizophrenia who presented earlier this month with chest pain. This reportedly has been occurring for several months with exertion such as walking long distances and lifting heavy objects. He was reportedly admitted to [facility] with a positive troponin and started on Plavix and Atenolol. He was seen by Dr. Jacinta Shoe and underwent cath on 8/7 by Dr. Tamala Julian which showed severe multi-vessel coronary disease with total occlusion of the mid RCA filling by left to right collaterals, diffuse 75% mid LAD stenosis and 65% + ostial LCX stenosis with an EF of 55%. He was started on medical treatment with Imdur added to his other meds. He was admitted yesterday with recurrent severe chest pain with exertion. This was associated with radiation to the left arm and SOB,  diaphoresis. ECG was unremarkable and troponin has been negative x 3. Chest pain resolved after two SL NTG. Dr. Cyndia Bent discussed the need for coronary artery bypass grafting surgery. Potential risks, benefits, and complications were discussed with the patient and he agreed to proceed with surgery. Pre operative carotid duplex US showed no significant internal carotid artery stenosis bilaterally. He underwent a CABG x 5 on 03/28/2016.  Brief Hospital Course:  The patient was extubated the evening of surgery without difficulty. He remained afebrile and hemodynamically stable. He was initially AAI paced and weaned off of Neo Synephrine drip. Gordy Councilman, a line, chest tubes, were removed early in the post operative course.Foley was removed and patient was unable to void on his own. He was started on Flomax. Voiding trial was done on 09/02. He was able to void on his own. Lopressor was not started secondary to labile BP and bradycardia (he was on Midodrine for previous hypotension).  He was volume over loaded and diuresed. He had ABL anemia. He did not require a post op transfusion. His last H and H was 9.1 and 26.5. He also had thrombocytopenia. His last platelet count is up to 121,000. He was weaned off the insulin drip. The patient's HGA1C pre op was 10.4.Diabetes coordinator was consulted. He will need close follow up with his medical doctor after discharge. The patient was felt surgically stable for transfer from the ICU to [facility] for further convalescence on 04/01/2016. He continues to progress with cardiac rehab. He was ambulating on room air. He has  been tolerating a diet and has had a bowel movement. Will restart Norvasc for better BP control. His SBP has mostly been in the 150's of late. As discussed with Dr. Servando Snare, will stop Midodrine as no further hypotension. Hope to start a BB after discharge. Epicardial pacing wires were removed on 09/02. Chest tube sutures will be removed in the office after  discharge. The patient is felt surgically stable for discharge today.   Latest Vital Signs: Blood pressure (!) 153/96, pulse 71, temperature 97.9 F (36.6 C), temperature source Oral, resp. rate 18, height 5\' 7"  (1.702 m), weight 149 lb 11.1 oz (67.9 kg), SpO2 98 %.  Physical Exam: General appearance: alert and cooperative Neurologic: intact Heart: regular rate and rhythm, S1, S2 normal, no murmur, click, rub or gallop Lungs: few rhonchi Extremities: extremities normal, atraumatic, no cyanosis or edema Wound: incision ok  Discharge Condition:Stable and discharged to home.  Recent laboratory studies:  Lab Results  Component Value Date   WBC 5.7 04/02/2016   HGB 9.1 (L) 04/02/2016   HCT 26.5 (L) 04/02/2016   MCV 90.4 04/02/2016   PLT 121 (L) 04/02/2016   Lab Results  Component Value Date   NA 136 04/02/2016   K 4.1 04/02/2016   CL 101 04/02/2016   CO2 29 04/02/2016   CREATININE 1.49 (H) 04/02/2016   GLUCOSE 135 (H) 04/02/2016   Diagnostic Studies: Dg Chest 2 View  Result Date: 03/24/2016 CLINICAL DATA:  Preop for CABG, smoking history EXAM: CHEST  2 VIEW COMPARISON:  Chest x-ray of 03/02/2016 FINDINGS: No active infiltrate or effusion is seen. Mediastinal and hilar contours are unremarkable. The heart is within normal limits in size. No bony abnormality is seen. A lower anterior cervical spine fusion plate is present. IMPRESSION: No active cardiopulmonary disease. Electronically Signed   By: Ivar Drape M.D.   On: 03/24/2016 15:51   [place] Upper Ext Art Right Ltd  Result Date: 03/17/2016 CLINICAL DATA:  50 year old male with a history of prior cardiac catheterization and concern for potential pseudoaneurysm EXAM: RIGHT UPPER EXTREMITY ARTERIAL DUPLEX SCAN TECHNIQUE: Gray-scale sonography as well as color Doppler and duplex ultrasound was performed to evaluate the arteries of the upper extremity. COMPARISON:  None. FINDINGS: Directed duplex of the right upper extremity performed.  Hypodense soft tissue/infiltration overlying the radial artery. No pseudoaneurysm identified. Triphasic waveform of the radial artery which remains patent. IMPRESSION: Edema/ ecchymosis overlying the right radial artery puncture site with no evidence of pseudoaneurysm. Unremarkable radial artery waveform. Signed, Dulcy Fanny. Earleen Newport, DO [facility] Radiology Electronically Signed   By: Corrie Mckusick D.O.   On: 03/17/2016 14:16   Dg Chest Port 1 View  Result Date: 03/30/2016 CLINICAL DATA:  Chest pain EXAM: PORTABLE CHEST 1 VIEW COMPARISON:  March 29, 2016 FINDINGS: Swan-Ganz catheter is been removed. Cordis tip is in the superior vena cava. Mediastinal drain and left chest tube have been removed. There is no demonstrable pneumothorax. Pacemaker wires are attached to the right heart. There is atelectatic change in the lung bases. The lungs elsewhere are clear. Heart is enlarged but stable. The pulmonary vascularity is normal. No adenopathy. There is postoperative change in the lower cervical spine. IMPRESSION: No pneumothorax. Cordis tip in superior vena cava. Bibasilar atelectasis, essentially stable. Stable cardiac prominence. Electronically Signed   By: Lowella Grip III M.D.   On: 03/30/2016 07:09   Discharge Instructions    Amb Referral to Cardiac Rehabilitation    Complete by:  As directed   Diagnosis:  CABG   CABG X ___:  5     Discharge Medications:   Medication List    STOP taking these medications   atenolol 100 MG tablet Commonly known as:  TENORMIN   cephALEXin 500 MG capsule Commonly known as:  KEFLEX   isosorbide mononitrate 60 MG 24 hr tablet Commonly known as:  IMDUR   lisinopril-hydrochlorothiazide 20-25 MG tablet Commonly known as:  PRINZIDE,ZESTORETIC   nitroGLYCERIN 0.4 MG SL tablet Commonly known as:  NITROSTAT   ranolazine 500 MG 12 hr tablet Commonly known as:  RANEXA     TAKE these medications     acetaminophen 325 MG tablet Commonly known as:  TYLENOL Take 2 tablets (650 mg total) by mouth every 6 (six) hours as needed for mild pain.   ALPRAZolam 1 MG tablet Commonly known as:  XANAX Take 1 mg by mouth 2 (two) times daily.   amLODipine 10 MG tablet Commonly known as:  NORVASC Take 10 mg by mouth daily.   atorvastatin 40 MG tablet Commonly known as:  LIPITOR Take 1 tablet (40 mg total) by mouth at bedtime. What changed:  when to take this   clopidogrel 75 MG tablet Commonly known as:  PLAVIX Take 1 tablet (75 mg total) by mouth daily.   divalproex 500 MG DR tablet Commonly known as:  DEPAKOTE Take 3 tablets (1,500 mg total) by mouth 2 (two) times daily.   FLUoxetine 20 MG capsule Commonly known as:  PROZAC Take 20 mg by mouth daily.   furosemide 40 MG tablet Commonly known as:  LASIX Take 1 tablet (40 mg total) by mouth daily. For 5 days then stop.   glucose blood test strip Commonly known as:  ACCU-CHEK AVIVA Use to test 4 times a day. E11.65   HYDROcodone-acetaminophen 7.5-325 MG tablet Commonly known as:  NORCO Take 1 tablet by mouth every 4 (four) hours as needed for severe pain. What changed:  when to take this  reasons to take this   insulin aspart 100 UNIT/ML FlexPen Commonly known as:  NOVOLOG Inject 5-11 Units into the skin 3 (three) times daily with meals.   LANTUS SOLOSTAR Hermann Inject 30 Units into the skin at bedtime.   LATUDA 60 MG Tabs Generic drug:  Lurasidone HCl Take 120 mg by mouth daily.   levETIRAcetam 500 MG tablet Commonly known as:  KEPPRA Take 2 tablets (1,000 mg total) by mouth 2 (two) times daily. What changed:  how much to take   Potassium Chloride ER 20 MEQ Tbcr Take 20 mEq by mouth daily. For 5 days then stop. What changed:  medication strength  how much to take  additional instructions   tamsulosin 0.4 MG Caps capsule Commonly known as:  FLOMAX Take 1 capsule (0.4 mg total) by mouth daily.      The  patient has been discharged on:   1.Beta Blocker:  Yes [   ]                              No   [ x  ]                              If No, reason:Previous bradycardia. Hope to start as outpatient  2.Ace Inhibitor/ARB: Yes [   ]  No  [  x  ]                                     If No, reason:Elevated creatinine, previous labile BP  3.Statin:   Yes [ x  ]                  No  [   ]                  If No, reason:  4.Ecasa:  Yes  [ x  ]                  No   [   ]                  If No, reason:  Follow Up Appointments: Follow-up Information    [name], MD Follow up on 05/04/2016.   Specialty:  Cardiothoracic Surgery Why:  PA/LAT CXR to be taken (at [facility] which is in the same building as Dr. Vivi Martens office) on 05/04/2016 at 10:00 am;Appointment time is at 10:30 am  Contact information: [address] [place] [address] [phone]        FANTA,TESFAYE, MD .   Specialty:  Internal Medicine Why:  Call for a follow up appointment regarding further diabetes management and surveillance of HGA1C 10.4 Contact information: [place] [address] [phone]        nurse Follow up on 04/08/2016.   Why:  Appointment is with nurse only to have chest tube sutures removed and time is at 10:45 am Contact information: [address] [place]  [address]       Jory Sims, NP Follow up on 04/18/2016.   Specialties:  Nurse Practitioner, Radiology, Cardiology Why:  [place] on 04/18/2016 at 1:50PM. Contact information: [place] [address] [phone]           Signed: Berneice Zettlemoyer MPA-C 04/03/2016, 10:14 AM

## 2016-04-01 NOTE — Progress Notes (Signed)
Patient transferred to 2W room 2. Report given to [employer].

## 2016-04-01 NOTE — Care Management Note (Signed)
Case Management Note  Patient Details  Name: ROLANDA LOLLIE MRN: [id number] Date of Birth: September 19, 1965  Subjective/Objective:  Pt OOB to chair, has ambulated several times around unit.  Pt states he plans to discharge to his grandmother and aunt's home and they will provide 24/7 assistance for him as needed.                           Expected Discharge Plan:  Home/Self Care  Discharge planning Services  CM Consult  Status of Service:  In process, will continue to follow  Girard Cooter, RN 04/01/2016, 10:08 AM

## 2016-04-01 NOTE — Discharge Instructions (Signed)
Coronary Artery Bypass Grafting, Care After °Refer to this sheet in the next few weeks. These instructions provide you with information on caring for yourself after your procedure. Your health care provider may also give you more specific instructions. Your treatment has been planned according to current medical practices, but problems sometimes occur. Call your health care provider if you have any problems or questions after your procedure. °WHAT TO EXPECT AFTER THE PROCEDURE °Recovery from surgery will be different for everyone. Some people feel well after 3 or 4 weeks, while for others it takes longer. After your procedure, it is typical to have the following: °· Nausea and a lack of appetite.   °· Constipation. °· Weakness and fatigue.   °· Depression or irritability.   °· Pain or discomfort at your incision site. °HOME CARE INSTRUCTIONS °· Take medicines only as directed by your health care provider. Do not stop taking medicines or start any new medicines without first checking with your health care provider. °· Take your pulse as directed by your health care provider. °· Perform deep breathing as directed by your health care provider. If you were given a device called an incentive spirometer, use it to practice deep breathing several times a day. Support your chest with a pillow or your arms when you take deep breaths or cough. °· Keep incision areas clean, dry, and protected. Remove or change any bandages (dressings) only as directed by your health care provider. You may have skin adhesive strips over the incision areas. Do not take the strips off. They will fall off on their own. °· Check incision areas daily for any swelling, redness, or drainage. °· If incisions were made in your legs, do the following: °¨ Avoid crossing your legs.   °¨ Avoid sitting for long periods of time. Change positions every 30 minutes.   °¨ Elevate your legs when you are sitting. °· Wear compression stockings as directed by your  health care provider. These stockings help keep blood clots from forming in your legs. °· Take showers once your health care provider approves. Until then, only take sponge baths. Pat incisions dry. Do not rub incisions with a washcloth or towel. Do not take baths, swim, or use a hot tub until your health care provider approves. °· Eat foods that are high in fiber, such as raw fruits and vegetables, whole grains, beans, and nuts. Meats should be lean cut. Avoid canned, processed, and fried foods. °· Drink enough fluid to keep your urine clear or pale yellow. °· Weigh yourself every day. This helps identify if you are retaining fluid that may make your heart and lungs work harder. °· Rest and limit activity as directed by your health care provider. You may be instructed to: °¨ Stop any activity at once if you have chest pain, shortness of breath, irregular heartbeats, or dizziness. Get help right away if you have any of these symptoms. °¨ Move around frequently for short periods or take short walks as directed by your health care provider. Increase your activities gradually. You may need physical therapy or cardiac rehabilitation to help strengthen your muscles and build your endurance. °¨ Avoid lifting, pushing, or pulling anything heavier than 10 lb (4.5 kg) for at least 6 weeks after surgery. °· Do not drive until your health care provider approves.  °· Ask your health care provider when you may return to work. °· Ask your health care provider when you may resume sexual activity. °· Keep all follow-up visits as directed by your health care   provider. This is important. °SEEK MEDICAL CARE IF: °· You have swelling, redness, increasing pain, or drainage at the site of an incision. °· You have a fever. °· You have swelling in your ankles or legs. °· You have pain in your legs.   °· You gain 2 or more pounds (0.9 kg) a day. °· You are nauseous or vomit. °· You have diarrhea.  °SEEK IMMEDIATE MEDICAL CARE IF: °· You have  chest pain that goes to your jaw or arms. °· You have shortness of breath.   °· You have a fast or irregular heartbeat.   °· You notice a "clicking" in your breastbone (sternum) when you move.   °· You have numbness or weakness in your arms or legs. °· You feel dizzy or light-headed.   °MAKE SURE YOU: °· Understand these instructions. °· Will watch your condition. °· Will get help right away if you are not doing well or get worse. °  °This information is not intended to replace advice given to you by your health care provider. Make sure you discuss any questions you have with your health care provider. °  °Document Released: 02/04/2005 Document Revised: 08/08/2014 Document Reviewed: 12/25/2012 °Elsevier Interactive Patient Education ©2016 [employer] Inc[employer] ° °

## 2016-04-01 NOTE — Progress Notes (Signed)
4 Days Post-Op Procedure(s) (LRB): CORONARY ARTERY BYPASS GRAFTING (CABG) x 5 USING GREATER SAPHENOUS VEIN (N/A) TRANSESOPHAGEAL ECHOCARDIOGRAM (TEE) (N/A) ENDOVEIN HARVEST OF GREATER SAPHENOUS VEIN (Right) Subjective: Feels better today. Foley is irritating him. Says he is ready to eat.  Objective: Vital signs in last 24 hours: Temp:  [98.3 F (36.8 C)-98.5 F (36.9 C)] 98.3 F (36.8 C) (09/01 0330) Pulse Rate:  [46-80] 79 (09/01 0700) Cardiac Rhythm: Atrial paced (08/31 2000) Resp:  [7-26] 20 (09/01 0700) BP: (99-126)/(72-115) 115/84 (09/01 0700) SpO2:  [92 %-100 %] 92 % (09/01 0700) Weight:  [68.3 kg (150 lb 9.2 oz)] 68.3 kg (150 lb 9.2 oz) (09/01 0600)  Hemodynamic parameters for last 24 hours:    Intake/Output from previous day: 08/31 0701 - 09/01 0700 In: 600 [P.O.:600] Out: 1355 [Urine:1355] Intake/Output this shift: No intake/output data recorded.  General appearance: alert and cooperative Neurologic: intact Heart: regular rate and rhythm, S1, S2 normal, no murmur, click, rub or gallop Lungs: few rhonchi Extremities: extremities normal, atraumatic, no cyanosis or edema Wound: incision ok  Lab Results:  Recent Labs  03/29/16 1650 03/29/16 1654 03/30/16 0352  WBC 12.1*  --  11.4*  HGB 11.8* 11.9* 10.2*  HCT 34.8* 35.0* 29.2*  PLT 86*  --  75*   BMET:  Recent Labs  03/31/16 0833 04/01/16 0253  NA 135 137  K 4.1 3.8  CL 102 101  CO2 29 29  GLUCOSE 136* 108*  BUN 33* 28*  CREATININE 1.68* 1.55*  CALCIUM 7.7* 7.9*    PT/INR: No results for input(s): LABPROT, INR in the last 72 hours. ABG    Component Value Date/Time   PHART 7.384 03/28/2016 1736   HCO3 24.3 (H) 03/28/2016 1736   TCO2 25 03/29/2016 1654   ACIDBASEDEF 1.0 03/28/2016 1736   O2SAT 100.0 03/28/2016 1736   CBG (last 3)   Recent Labs  03/31/16 1947 03/31/16 2332 04/01/16 0333  GLUCAP 175* 170* 101*    Assessment/Plan: S/P Procedure(s) (LRB): CORONARY ARTERY BYPASS  GRAFTING (CABG) x 5 USING GREATER SAPHENOUS VEIN (N/A) TRANSESOPHAGEAL ECHOCARDIOGRAM (TEE) (N/A) ENDOVEIN HARVEST OF GREATER SAPHENOUS VEIN (Right)  He is hemodynamically stable on Midodrine. Will back off on dose as BP rises. HR is sinus brady in the 50's as it was preop. Will not start a beta blocker.  Bipolar schizophrenia: more alert and conversant today. Continue psych meds.   DM: he is starting to eat. Will continue Levemir 10 units daily and use meal coverage Novolog and SSI.  He has aspirin allergy with rash so will start on Plavix.  Mild rise in creat postop is coming back down.  Urinary retention: Will start Flomax and leave foley in today. Plan to remove foley tomorrow for voiding trial.   Will transfer to 2W and contiue mobilization, IS.  He should be able to go home with his family in a couple days once urinary retention issue resolved.   LOS: 4 days    [name] 04/01/2016

## 2016-04-02 LAB — GLUCOSE, CAPILLARY
GLUCOSE-CAPILLARY: 147 mg/dL — AB (ref 65–99)
Glucose-Capillary: 138 mg/dL — ABNORMAL HIGH (ref 65–99)
Glucose-Capillary: 91 mg/dL (ref 65–99)
Glucose-Capillary: 82 mg/dL (ref 65–99)

## 2016-04-02 LAB — CBC
HEMATOCRIT: 26.5 % — AB (ref 39.0–52.0)
Hemoglobin: 9.1 g/dL — ABNORMAL LOW (ref 13.0–17.0)
MCH: 31.1 pg (ref 26.0–34.0)
MCHC: 34.3 g/dL (ref 30.0–36.0)
MCV: 90.4 fL (ref 78.0–100.0)
Platelets: 121 10*3/uL — ABNORMAL LOW (ref 150–400)
RBC: 2.93 MIL/uL — AB (ref 4.22–5.81)
RDW: 12.5 % (ref 11.5–15.5)
WBC: 5.7 10*3/uL (ref 4.0–10.5)

## 2016-04-02 LAB — BASIC METABOLIC PANEL
Anion gap: 6 (ref 5–15)
BUN: 20 mg/dL (ref 6–20)
CHLORIDE: 101 mmol/L (ref 101–111)
CO2: 29 mmol/L (ref 22–32)
Calcium: 8.2 mg/dL — ABNORMAL LOW (ref 8.9–10.3)
Creatinine, Ser: 1.49 mg/dL — ABNORMAL HIGH (ref 0.61–1.24)
GFR calc non Af Amer: 52 mL/min — ABNORMAL LOW (ref >60)
Glucose, Bld: 135 mg/dL — ABNORMAL HIGH (ref 65–99)
POTASSIUM: 4.1 mmol/L (ref 3.5–5.1)
SODIUM: 136 mmol/L (ref 135–145)

## 2016-04-02 MED ORDER — AMLODIPINE BESYLATE 10 MG PO TABS
10.0000 mg | ORAL_TABLET | Freq: Every day | ORAL | Status: DC
Start: 2016-04-02 — End: 2016-04-03
  Administered 2016-04-02 – 2016-04-03 (×2): 10 mg via ORAL
  Filled 2016-04-02 (×2): qty 1

## 2016-04-02 NOTE — Progress Notes (Signed)
CARDIAC REHAB PHASE I   PRE:  Rate/Rhythm: 64 SR    BP: sitting 150/86    SaO2: 96 RA  MODE:  Ambulation: 400 ft   POST:  Rate/Rhythm: 86 SR    BP: sitting 171/75     SaO2: 92 RA, 99 RA  Pt able to get out of bed independently but needed reminders of sternal precautions. Used RW, mostly steady but had x1 LOB when he got distracted. Needs RW for home. To recliner, thankful to be up. Ed completed. Will send referral to [facility]. Encouraged x2 more walks and IS.  1340-1450   Darrick Meigs CES, ACSM 04/02/2016 2:44 PM

## 2016-04-02 NOTE — Progress Notes (Addendum)
      [address]       , [address]             [phone]        5 Days Post-Op Procedure(s) (LRB): CORONARY ARTERY BYPASS GRAFTING (CABG) x 5 USING GREATER SAPHENOUS VEIN (N/A) TRANSESOPHAGEAL ECHOCARDIOGRAM (TEE) (N/A) ENDOVEIN HARVEST OF GREATER SAPHENOUS VEIN (Right)  Subjective: Patient eating breakfast. No specific complaints.  Objective: Vital signs in last 24 hours: Temp:  [98.1 F (36.7 C)-99 F (37.2 C)] 99 F (37.2 C) (09/02 0430) Pulse Rate:  [54-81] 81 (09/02 0430) Cardiac Rhythm: Normal sinus rhythm (09/02 0700) Resp:  [14-20] 20 (09/02 0430) BP: (114-148)/(75-94) 133/87 (09/02 0430) SpO2:  [96 %-100 %] 96 % (09/02 0430) Weight:  [154 lb 11.2 oz (70.2 kg)] 154 lb 11.2 oz (70.2 kg) (09/02 0430)  Pre op weight 66 kg Current Weight  04/02/16 154 lb 11.2 oz (70.2 kg)       Intake/Output from previous day: 09/01 0701 - 09/02 0700 In: 600 [P.O.:600] Out: 550 [Urine:550]   Physical Exam:  Cardiovascular: RRR Pulmonary: Slightly diminished at bases Abdomen: Soft, non tender, bowel sounds present. Extremities: Mild bilateral lower extremity edema. Ecchymosis right thigh. Wounds: Clean and dry.  No erythema or signs of infection.  Lab Results: CBC:No results for input(s): WBC, HGB, HCT, PLT in the last 72 hours. BMET:  Recent Labs  03/31/16 0833 04/01/16 0253  NA 135 137  K 4.1 3.8  CL 102 101  CO2 29 29  GLUCOSE 136* 108*  BUN 33* 28*  CREATININE 1.68* 1.55*  CALCIUM 7.7* 7.9*    PT/INR:  Lab Results  Component Value Date   INR 1.53 03/28/2016   INR 1.10 03/24/2016   INR 1.11 03/22/2016   ABG:  INR: Will add last result for INR, ABG once components are confirmed Will add last 4 CBG results once components are confirmed  Assessment/Plan:  1. CV - SR in the 70's. On back up pacer at 50. Will disconnect pacer.Previous hypotension and bradycardia so not on BB. On Midodrine 10 mg tid. Will restart Norvasc for  better BP control. 2.  Pulmonary - On room air. Encourage incentive spirometer 3.  Acute blood loss anemia - Last H and H 10.2 and 29.2 4. Urinary retention-started on Flomax. Will try voiding trial today. 5. DM-CBGs 111/95/138. Pre op HGA1C 10.4. Continue Insulin. Will need follow up with medical doctor after discharge. 6. Creatinine yesterday elevated but decreased to 1.55. Will recheck this am. 7. Thrombocytopenia-last platelet 75,000 8. Remove EPW later this am 9. Possibly home in am  ZIMMERMAN,DONIELLE MPA-C 04/02/2016,7:36 AM  I have seen and examined [name] and agree with the above assessment  and plan.  Grace Isaac MD Beeper [phone] Office [phone] 04/02/2016 3:56 PM

## 2016-04-02 NOTE — Progress Notes (Signed)
Kristin removed Pt. wires with some resistance after I attempted with resistance. Wires intact Pt. Tolerated procedure well. See flowsheet  for VS that were stable.

## 2016-04-03 LAB — GLUCOSE, CAPILLARY: GLUCOSE-CAPILLARY: 97 mg/dL (ref 65–99)

## 2016-04-03 MED ORDER — POTASSIUM CHLORIDE CRYS ER 20 MEQ PO TBCR
20.0000 meq | EXTENDED_RELEASE_TABLET | Freq: Every day | ORAL | Status: DC
  Administered 2016-04-03: 20 meq via ORAL
  Filled 2016-04-03: qty 1

## 2016-04-03 MED ORDER — ATORVASTATIN CALCIUM 40 MG PO TABS: 40.0000 mg | ORAL_TABLET | Freq: Every day | ORAL | 0 refills | Status: DC

## 2016-04-03 MED ORDER — TAMSULOSIN HCL 0.4 MG PO CAPS: 0.4000 mg | ORAL_CAPSULE | Freq: Every day | ORAL | 1 refills | Status: DC

## 2016-04-03 MED ORDER — FUROSEMIDE 40 MG PO TABS: 40.0000 mg | ORAL_TABLET | Freq: Every day | ORAL | 0 refills | Status: DC

## 2016-04-03 MED ORDER — ACETAMINOPHEN 325 MG PO TABS: 650.0000 mg | ORAL_TABLET | Freq: Four times a day (QID) | ORAL | Status: DC | PRN

## 2016-04-03 MED ORDER — POTASSIUM CHLORIDE ER 20 MEQ PO TBCR: 20.0000 meq | EXTENDED_RELEASE_TABLET | Freq: Every day | ORAL | 0 refills | Status: DC

## 2016-04-03 MED ORDER — INSULIN ASPART 100 UNIT/ML ~~LOC~~ SOLN: 4.0000 [IU] | Freq: Three times a day (TID) | SUBCUTANEOUS | Status: DC

## 2016-04-03 MED ORDER — CLOPIDOGREL BISULFATE 75 MG PO TABS: 75.0000 mg | ORAL_TABLET | Freq: Every day | ORAL | 1 refills | Status: DC

## 2016-04-03 MED ORDER — FUROSEMIDE 40 MG PO TABS
40.0000 mg | ORAL_TABLET | Freq: Every day | ORAL | Status: DC
  Administered 2016-04-03: 40 mg via ORAL
  Filled 2016-04-03: qty 1

## 2016-04-03 MED ORDER — HYDROCODONE-ACETAMINOPHEN 7.5-325 MG PO TABS: 1.0000 | ORAL_TABLET | ORAL | 0 refills | Status: DC | PRN

## 2016-04-03 NOTE — Progress Notes (Addendum)
      [address]       [place] [address]             [phone]        6 Days Post-Op Procedure(s) (LRB): CORONARY ARTERY BYPASS GRAFTING (CABG) x 5 USING GREATER SAPHENOUS VEIN (N/A) TRANSESOPHAGEAL ECHOCARDIOGRAM (TEE) (N/A) ENDOVEIN HARVEST OF GREATER SAPHENOUS VEIN (Right)  Subjective: Patient without complaints. He wants to go home.  Objective: Vital signs in last 24 hours: Temp:  [97.9 F (36.6 C)-98.9 F (37.2 C)] 97.9 F (36.6 C) (09/03 0516) Pulse Rate:  [63-77] 71 (09/03 0516) Cardiac Rhythm: Normal sinus rhythm (09/03 0700) Resp:  [16-20] 18 (09/03 0516) BP: (131-166)/(84-97) 152/97 (09/03 0516) SpO2:  [98 %-100 %] 98 % (09/03 0516) Weight:  [149 lb 11.1 oz (67.9 kg)] 149 lb 11.1 oz (67.9 kg) (09/03 0516)  Pre op weight 66 kg Current Weight  04/03/16 149 lb 11.1 oz (67.9 kg)       Intake/Output from previous day: 09/02 0701 - 09/03 0700 In: 47 [P.O.:460] Out: 175 [Urine:175]   Physical Exam:  Cardiovascular: RRR Pulmonary: Slightly diminished at bases Abdomen: Soft, non tender, bowel sounds present. Extremities: Mild bilateral lower extremity edema. Ecchymosis right thigh. Wounds: Clean and dry.  No erythema or signs of infection.  Lab Results: CBC:  Recent Labs  04/02/16 0829  WBC 5.7  HGB 9.1*  HCT 26.5*  PLT 121*   BMET:   Recent Labs  04/01/16 0253 04/02/16 0829  NA 137 136  K 3.8 4.1  CL 101 101  CO2 29 29  GLUCOSE 108* 135*  BUN 28* 20  CREATININE 1.55* 1.49*  CALCIUM 7.9* 8.2*    PT/INR:  Lab Results  Component Value Date   INR 1.53 03/28/2016   INR 1.10 03/24/2016   INR 1.11 03/22/2016   ABG:  INR: Will add last result for INR, ABG once components are confirmed Will add last 4 CBG results once components are confirmed  Assessment/Plan:  1. CV - SR in the 70's.Previous hypotension and bradycardia so not on BB. On Midodrine 10 mg tid and Norvasc 10 mg daily. Systolic BP still in [id number] so  will stop Midodrine 2.  Pulmonary - On room air. Encourage incentive spirometer 3.  Acute blood loss anemia - Last H and H 9.1 and 26.5 4. Urinary retention resolved-started on Flomax.  5. DM-CBGs 82/147/97. Pre op HGA1C 10.4. Continue scheduled Levemir and will decrease Novolog with meals to avoid hypoglycemia. Will need follow up with medical doctor after discharge. 6. Last creatinine decreased to 1.49. Not on ACE 7. Thrombocytopenia-last platelets up to  121,000 8. Leave chest tube sutures as will be removed in office 9.Discharge later today  Kurt Hoffmeier MPA-C 04/03/2016,7:30 AM

## 2016-04-03 NOTE — Progress Notes (Signed)
Pt. Discharged to home with family Pt. D/C'd via wheelchair with NT Discharge information reviewed and given All personal belongings given to Pt.  Education discussed and reviewed with teach back IV was d/c Tele d/c

## 2016-04-05 ENCOUNTER — Emergency Department (HOSPITAL_COMMUNITY)
Admission: EM | Admit: 2016-04-05 | Discharge: 2016-04-06 | Disposition: A | Discharge status: Home / Self Care | Payer: Medicaid Other | Attending: Emergency Medicine | Admitting: Emergency Medicine

## 2016-04-05 ENCOUNTER — Encounter (HOSPITAL_COMMUNITY): Payer: Self-pay | Admitting: *Deleted

## 2016-04-05 ENCOUNTER — Encounter: Payer: Self-pay | Admitting: "Endocrinology

## 2016-04-05 ENCOUNTER — Ambulatory Visit (INDEPENDENT_AMBULATORY_CARE_PROVIDER_SITE_OTHER): Payer: Medicaid Other | Admitting: "Endocrinology

## 2016-04-05 VITALS — BP 138/85 | HR 68 | Ht 67.0 in | Wt 152.0 lb

## 2016-04-05 DIAGNOSIS — Y999 Unspecified external cause status: Secondary | ICD-10-CM | POA: Diagnosis not present

## 2016-04-05 DIAGNOSIS — Z794 Long term (current) use of insulin: Secondary | ICD-10-CM | POA: Insufficient documentation

## 2016-04-05 DIAGNOSIS — I2510 Atherosclerotic heart disease of native coronary artery without angina pectoris: Secondary | ICD-10-CM | POA: Diagnosis not present

## 2016-04-05 DIAGNOSIS — Y939 Activity, unspecified: Secondary | ICD-10-CM | POA: Insufficient documentation

## 2016-04-05 DIAGNOSIS — Z91199 Patient's noncompliance with other medical treatment and regimen due to unspecified reason: Secondary | ICD-10-CM

## 2016-04-05 DIAGNOSIS — Y929 Unspecified place or not applicable: Secondary | ICD-10-CM | POA: Diagnosis not present

## 2016-04-05 DIAGNOSIS — X58XXXA Exposure to other specified factors, initial encounter: Secondary | ICD-10-CM | POA: Diagnosis not present

## 2016-04-05 DIAGNOSIS — Z9119 Patient's noncompliance with other medical treatment and regimen: Secondary | ICD-10-CM | POA: Diagnosis not present

## 2016-04-05 DIAGNOSIS — T07XXXA Unspecified multiple injuries, initial encounter: Secondary | ICD-10-CM

## 2016-04-05 DIAGNOSIS — E119 Type 2 diabetes mellitus without complications: Secondary | ICD-10-CM | POA: Insufficient documentation

## 2016-04-05 DIAGNOSIS — E1159 Type 2 diabetes mellitus with other circulatory complications: Secondary | ICD-10-CM | POA: Diagnosis not present

## 2016-04-05 DIAGNOSIS — F17210 Nicotine dependence, cigarettes, uncomplicated: Secondary | ICD-10-CM | POA: Diagnosis not present

## 2016-04-05 DIAGNOSIS — I10 Essential (primary) hypertension: Secondary | ICD-10-CM

## 2016-04-05 DIAGNOSIS — E785 Hyperlipidemia, unspecified: Secondary | ICD-10-CM

## 2016-04-05 DIAGNOSIS — S8011XA Contusion of right lower leg, initial encounter: Secondary | ICD-10-CM | POA: Insufficient documentation

## 2016-04-05 NOTE — Progress Notes (Signed)
Subjective:    Patient ID: Melvin Taylor, male    DOB: 1965/12/24, PCP Rosita Fire, MD   Past Medical History:  Diagnosis Date  . Anxiety   . Bipolar 1 disorder ([place])   . Chest pain   . Chronic back pain   . Coronary artery disease   . Depression   . Diabetes mellitus    Type II  . Hallucinations    "long history of them"  . Headache(784.0)   . History of gout   . HTN (hypertension)   . Insomnia   . Neuropathy ([place])   . Pain management   . Schizophrenia ([place])   . Seizures ([place])    last sz between July 5-9th, 2016; epilepsy  . Shortness of breath dyspnea    with exertion  . Sleep apnea   . Transfusion of blood product refused for religious reason    Past Surgical History:  Procedure Laterality Date  . ANTERIOR CERVICAL DECOMP/DISCECTOMY FUSION N/A 12/14/2015   Procedure: ANTERIOR CERVICAL DECOMPRESSION/DISCECTOMY FUSION CERVICAL FIVE -SIX;  Surgeon: Earnie Larsson, MD;  Location: [facility];  Service: Neurosurgery;  Laterality: N/A;  . BIOPSY  05/07/2015   Procedure: BIOPSY (Gastric);  Surgeon: [name], MD;  Location: [facility];  Service: Endoscopy;;  . CARDIAC CATHETERIZATION N/A 03/07/2016   Procedure: Left Heart Cath and Coronary Angiography;  Surgeon: Belva Crome, MD;  Location: [facility];  Service: Cardiovascular;  Laterality: N/A;  . COLONOSCOPY WITH PROPOFOL N/A 05/07/2015   [id number] diverticulosis, multiple colon polyps removed, tubular adenoma, serrated colon polyp. Next colonoscopy October 2019  . CORONARY ARTERY BYPASS GRAFT N/A 03/28/2016   Procedure: CORONARY ARTERY BYPASS GRAFTING (CABG) x 5 USING GREATER SAPHENOUS VEIN;  Surgeon: [name], MD;  Location: [facility];  Service: Open Heart Surgery;  Laterality: N/A;  . ENDOVEIN HARVEST OF GREATER SAPHENOUS VEIN Right 03/28/2016   Procedure: ENDOVEIN HARVEST OF GREATER SAPHENOUS VEIN;  Surgeon: [name], MD;  Location: [facility];  Service: Open Heart Surgery;  Laterality: Right;  .  ESOPHAGEAL DILATION N/A 05/07/2015   Procedure: ESOPHAGEAL DILATION WITH 56FR MALONEY DILATOR;  Surgeon: [name], MD;  Location: [facility];  Service: Endoscopy;  Laterality: N/A;  . ESOPHAGOGASTRODUODENOSCOPY (EGD) WITH PROPOFOL N/A 05/07/2015   RMR: Status post dilation of normal esophagus. Gastritis.  Marland Kitchen KNEE SURGERY Left    arthroscopy  . MANDIBLE FRACTURE SURGERY    . POLYPECTOMY  05/07/2015   Procedure: POLYPECTOMY (Hepatic Flexure, Distal Transverse Colon, Rectal);  Surgeon: [name], MD;  Location: [facility];  Service: Endoscopy;;  . TEE WITHOUT CARDIOVERSION N/A 03/28/2016   Procedure: TRANSESOPHAGEAL ECHOCARDIOGRAM (TEE);  Surgeon: [name], MD;  Location: [facility];  Service: Open Heart Surgery;  Laterality: N/A;  . TONSILLECTOMY     Social History   Social History  . Marital status: Single    Spouse name: N/A  . Number of children: N/A  . Years of education: 11th grade   Occupational History  . unemployed Not Employed   Social History Main Topics  . Smoking status: Current Every Day Smoker    Packs/day: 0.75    Years: 30.00    Types: Cigarettes  . Smokeless tobacco: Never Used     Comment: 09/21/15 trying to quit, < 1 PPD  . Alcohol use No     Comment: 02/11/15 "drink a beer every now and then"  . Drug use:     Types: Marijuana  Comment: hx of cocaine use but denies using in over a year, denies marjuana use 01/18/15 and 02/11/15  . Sexual activity: Not Currently   Other Topics Concern  . None   Social History Narrative   Lives alone   Drinks a cup of coffee a day   Outpatient Encounter Prescriptions as of 04/05/2016  Medication Sig  . acetaminophen (TYLENOL) 325 MG tablet Take 2 tablets (650 mg total) by mouth every 6 (six) hours as needed for mild pain.  Marland Kitchen ALPRAZolam (XANAX) 1 MG tablet Take 1 mg by mouth 2 (two) times daily.  Marland Kitchen amLODipine (NORVASC) 10 MG tablet Take 10 mg by mouth daily.  Marland Kitchen atorvastatin (LIPITOR) 40 MG tablet Take 1 tablet (40 mg total)  by mouth at bedtime.  . clopidogrel (PLAVIX) 75 MG tablet Take 1 tablet (75 mg total) by mouth daily.  . divalproex (DEPAKOTE) 500 MG DR tablet Take 3 tablets (1,500 mg total) by mouth 2 (two) times daily.  Marland Kitchen FLUoxetine (PROZAC) 20 MG capsule Take 20 mg by mouth daily.  . furosemide (LASIX) 40 MG tablet Take 1 tablet (40 mg total) by mouth daily. For 5 days then stop.  Marland Kitchen glucose blood (ACCU-CHEK AVIVA) test strip Use to test 4 times a day. E11.65  . HYDROcodone-acetaminophen (NORCO) 7.5-325 MG tablet Take 1 tablet by mouth every 4 (four) hours as needed for severe pain.  Marland Kitchen insulin aspart (NOVOLOG) 100 UNIT/ML FlexPen Inject 5-11 Units into the skin 3 (three) times daily with meals.  . Insulin Glargine (LANTUS SOLOSTAR ) Inject 30 Units into the skin at bedtime.  . Lurasidone HCl (LATUDA) 60 MG TABS Take 120 mg by mouth daily.   . potassium chloride 20 MEQ TBCR Take 20 mEq by mouth daily. For 5 days then stop.  . tamsulosin (FLOMAX) 0.4 MG CAPS capsule Take 1 capsule (0.4 mg total) by mouth daily.  . [DISCONTINUED] levETIRAcetam (KEPPRA) 500 MG tablet Take 2 tablets (1,000 mg total) by mouth 2 (two) times daily. (Patient taking differently: Take 1,500 mg by mouth 2 (two) times daily. )   No facility-administered encounter medications on file as of 04/05/2016.    ALLERGIES: Allergies  Allergen Reactions  . Aspirin Swelling    SWELLING REACTION UNSPECIFIED   . Other Other (See Comments)    NO BLOOD PRODUCTS per patient's wishes   VACCINATION STATUS: Immunization History  Administered Date(s) Administered  . Influenza,trivalent, recombinat, inj, PF 05/02/2015  . Tdap 01/18/2015    Diabetes  He presents for his follow-up diabetic visit. He has type 2 diabetes mellitus. Onset time: He was diagnosed the approximate age of 52 years. His disease course has been worsening. There are no hypoglycemic associated symptoms. Pertinent negatives for hypoglycemia include no confusion, headaches, pallor  or seizures. Associated symptoms include fatigue, polydipsia and polyuria. Pertinent negatives for diabetes include no chest pain, no polyphagia and no weakness. There are no hypoglycemic complications. Symptoms are worsening. Diabetic complications include heart disease. (He is seriously noncompliant patient. He is now diagnosed with coronary artery disease at [facility] since last visit. He is awaiting for his outpatient cardiology evaluation. This assay to be set for 03/24/2016.) Risk factors for coronary artery disease include diabetes mellitus, dyslipidemia, hypertension, male sex, tobacco exposure and sedentary lifestyle. Current diabetic treatment includes insulin injections. He is compliant with treatment none of the time. His weight is stable. He is following a generally unhealthy diet. When asked about meal planning, he reported none. He has had a previous  visit with a dietitian. He never participates in exercise. Home blood sugar record trend: He is logs are significant for persistent hyperglycemia, average for the last 7 days of 18 readings is 260 mg/dL. His breakfast blood glucose range is generally >200 mg/dl. His lunch blood glucose range is generally >200 mg/dl. His dinner blood glucose range is generally >200 mg/dl. His overall blood glucose range is >200 mg/dl. An ACE inhibitor/angiotensin II receptor blocker is not being taken.  Hyperlipidemia  This is a chronic problem. The current episode started more than 1 year ago. Exacerbating diseases include diabetes. Pertinent negatives include no chest pain, myalgias or shortness of breath. Current antihyperlipidemic treatment includes statins. Risk factors for coronary artery disease include diabetes mellitus, dyslipidemia, hypertension, a sedentary lifestyle and male sex.  Hypertension  Pertinent negatives include no chest pain, headaches, neck pain, palpitations or shortness of breath. Risk factors for coronary artery disease include  smoking/tobacco exposure, diabetes mellitus, dyslipidemia and male gender.     Review of Systems  Constitutional: Positive for fatigue. Negative for unexpected weight change.  HENT: Negative for dental problem, mouth sores and trouble swallowing.   Eyes: Negative for visual disturbance.  Respiratory: Negative for cough, choking, chest tightness, shortness of breath and wheezing.   Cardiovascular: Negative for chest pain, palpitations and leg swelling.  Gastrointestinal: Negative for abdominal distention, abdominal pain, constipation, diarrhea, nausea and vomiting.  Endocrine: Positive for polydipsia and polyuria. Negative for polyphagia.  Genitourinary: Negative for dysuria, flank pain, hematuria and urgency.  Musculoskeletal: Negative for back pain, gait problem, myalgias and neck pain.  Skin: Negative for pallor, rash and wound.  Neurological: Negative for seizures, syncope, weakness, numbness and headaches.  Psychiatric/Behavioral: Negative.  Negative for confusion and dysphoric mood.    Objective:    BP 138/85   Pulse 68   Ht 5\' 7"  (1.702 m)   Wt 152 lb (68.9 kg)   BMI 23.81 kg/m   Wt Readings from Last 3 Encounters:  04/05/16 152 lb (68.9 kg)  04/03/16 149 lb 11.1 oz (67.9 kg)  03/24/16 143 lb 8 oz (65.1 kg)    Physical Exam  Constitutional: He is oriented to person, place, and time. He appears well-developed and well-nourished. He is cooperative. No distress.  HENT:  Head: Normocephalic and atraumatic.  Eyes: EOM are normal.  Neck: Normal range of motion. Neck supple. No tracheal deviation present. No thyromegaly present.  Cardiovascular: Normal rate, S1 normal, S2 normal and normal heart sounds.  Exam reveals no gallop.   No murmur heard. Pulses:      Dorsalis pedis pulses are 1+ on the right side, and 1+ on the left side.       Posterior tibial pulses are 1+ on the right side, and 1+ on the left side.  Pulmonary/Chest: Breath sounds normal. No respiratory distress.  He has no wheezes.  Abdominal: Soft. Bowel sounds are normal. He exhibits no distension. There is no tenderness. There is no guarding and no CVA tenderness.  Musculoskeletal: He exhibits no edema.       Right shoulder: He exhibits no swelling and no deformity.  Neurological: He is alert and oriented to person, place, and time. He has normal strength and normal reflexes. No cranial nerve deficit or sensory deficit. Gait normal.  Skin: Skin is warm and dry. No rash noted. No cyanosis. Nails show no clubbing.  Psychiatric: His speech is normal. Cognition and memory are normal.  Noncompliant behavior.   Chemistry (most recent): Lab Results  Component  Value Date   NA 136 04/02/2016   K 4.1 04/02/2016   CL 101 04/02/2016   CO2 29 04/02/2016   BUN 20 04/02/2016   CREATININE 1.49 (H) 04/02/2016   Diabetic Labs (most recent): Lab Results  Component Value Date   HGBA1C 10.4 (H) 03/24/2016   HGBA1C 10.3 (H) 01/18/2016   HGBA1C 8.3 (H) 10/19/2015   Lipid Panel     Component Value Date/Time   CHOL 120 03/22/2016 0853   TRIG 37 03/22/2016 0853   HDL 60 03/22/2016 0853   CHOLHDL 2.0 03/22/2016 0853   VLDL 7 03/22/2016 0853   LDLCALC 53 03/22/2016 0853    Assessment & Plan:   1. Uncontrolled type 2 diabetes mellitus with complication, with long-term current use of insulin ([place])  - diabetes is  complicated by Coronary artery disease since last visit, noncompliance and patient remains at a high risk for more acute and chronic complications of diabetes which include CAD, CVA, CKD, retinopathy, and neuropathy. These are all discussed in detail with the patient.  Patient came with out his meter, And logs showed rare random monitoring of blood glucose averaging more than 200 mg/dL. - his  recent A1c 8.3% improving from 15.2 %.  Glucose logs and insulin administration records pertaining to this visit,  to be scanned into patient's records.  Recent labs reviewed.   - I have re-counseled the  patient on diet management   by adopting a carbohydrate restricted / protein rich  Diet.  - Suggestion is made for patient to avoid simple carbohydrates   from their diet including Cakes , Desserts, Ice Cream,  Soda (  diet and regular) , Sweet Tea , Candies,  Chips, Cookies, Artificial Sweeteners,   and "Sugar-free" Products .  This will help patient to have stable blood glucose profile and potentially avoid unintended  Weight gain.  - Patient is advised to stick to a routine mealtimes to eat 3 meals  a day and avoid unnecessary snacks ( to snack only to correct hypoglycemia).  - The patient  has been  scheduled with Jearld Fenton, RDN, CDE for individualized DM education.  - I have approached patient with the following individualized plan to manage diabetes and patient reluctantly accepts .  - Unfortunately Mr. Cease remains alarmingly noncompliant. Based on his glucose profile and complication status, he will continue to need basal/bolus insulin to treat his diabetes. However, unfortunately this patient has never shown adequate engagement for safe use of insulin. He came with only 4 readings in the last 7 days making it difficult to adjust his insulin dose.   I urged him to continue monitoring blood glucose before meals and at bedtime.   - In the meantime, I will continue Lantus  30 units daily at bedtime,  NovoLog 5 units 3 times a day before meals for pre-meal blood glucose above 90 mg/dL plus correction.  he is asked to return in one week with his logs in meter. - I will discontinue his  metformin .  - He is not a good candidate for incretin therapy . - Patient specific target  for A1c; LDL, HDL, Triglycerides, and  Waist Circumference were discussed in detail.  2) BP/HTN: Uncontrolled. Continue current medications. 3)  Weight/Diet: CDE consult in progress, exercise, and carbohydrates information provided.  4) medical noncompliance: I counseled him to refocus for better care of  diabetes. 5) Chronic Care/Health Maintenance:  -Patient  Is on Statin medications and encouraged to continue to follow up with  Ophthalmology, Podiatrist at least yearly or according to recommendations, and advised to quit smoking. I have recommended yearly flu vaccine and pneumonia vaccination at least every 5 years; moderate intensity exercise for up to 150 minutes weekly; and  sleep for at least 7 hours a day. - I have urged him to maintain his scheduled outpatient cardiology evaluation given his recent diagnosis with coronary artery disease at [facility]. This outpatient cardiology evaluation is said to be scheduled for 03/24/2016. I advised him to go to emergency room if he develops chest pain in the interim.  - 25 minutes of time was spent on the care of this patient , 50% of which was applied for counseling on diabetes complications and their preventions.  - I advised patient to maintain close follow up with [facility], MD for primary care needs.  Patient is asked to bring meter and  blood glucose logs during their next visit.   Follow up plan: -Return in about 1 week (around 04/12/2016) for follow up with meter and logs- no labs.  Glade Lloyd, MD Phone: [phone]  Fax: [phone]   04/05/2016, 11:32 AM

## 2016-04-05 NOTE — ED Provider Notes (Signed)
[facility] DEPT Provider Note   CSN: [id number] Arrival date & time: 04/05/16  2228  By signing my name below, I, Gwenlyn Fudge, attest that this documentation has been prepared under the direction and in the presence of Rolland Porter, MD. Electronically Signed: Gwenlyn Fudge, ED Scribe. 04/05/16. 1:17 AM.  Time Seen 23:50 PM    History   Chief Complaint Chief Complaint  Patient presents with  . Leg Swelling   The history is provided by the patient. No language interpreter was used.    HPI Comments: DAEKWON FOUGHT is a 50 y.o. male with PMHx of Depression, DM, HTN, CAD, and Neuropathy who presents to the Emergency Department complaining of gradually worsening bruise on his upper right leg. Pt had bypass surgery x8 days ago. He states he has bruised on his leg that it is dark and long that he noticed when he went to the bathroom tonight. He did not see it this mornnig. Pt reports associated difficulty concentrating. He denies pain to the area. He states he lives at home alone.  Past Medical History:  Diagnosis Date  . Anxiety   . Bipolar 1 disorder ([place])   . Chest pain   . Chronic back pain   . Coronary artery disease   . Depression   . Diabetes mellitus    Type II  . Hallucinations    "long history of them"  . Headache(784.0)   . History of gout   . HTN (hypertension)   . Insomnia   . Neuropathy ([place])   . Pain management   . Schizophrenia ([place])   . Seizures ([place])    last sz between July 5-9th, 2016; epilepsy  . Shortness of breath dyspnea    with exertion  . Sleep apnea   . Transfusion of blood product refused for religious reason     Patient Active Problem List   Diagnosis Date Noted  . CAD (coronary artery disease) 03/28/2016  . Chest pain 03/21/2016  . Hyperlipidemia 03/11/2016  . Ischemic chest pain ([place])   . NSTEMI (non-ST elevated myocardial infarction) ([place])   . Coronary atherosclerosis of native coronary artery 03/03/2016  . Cervical spinal stenosis  12/14/2015  . Loss of weight 08/25/2015  . Essential hypertension, benign 07/23/2015  . Personal history of noncompliance with medical treatment, presenting hazards to health 07/23/2015  . Abdominal pain 06/08/2015  . Constipation 06/08/2015  . History of colonic polyps   . Diverticulosis of colon without hemorrhage   . Mucosal abnormality of stomach   . Encounter for screening colonoscopy 04/16/2015  . Dysphagia 04/16/2015  . Partial symptomatic epilepsy with complex partial seizures, intractable, without status epilepticus ([place]) 11/10/2014  . DM type 2 causing vascular disease ([place]) 06/29/2014  . Musculoskeletal pain 06/29/2014  . Hyperglycemia without ketosis 09/07/2013  . 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 Knee pain 12/28/2010  . Medial meniscus, posterior horn derangement 12/28/2010    Past Surgical History:  Procedure Laterality Date  . ANTERIOR CERVICAL DECOMP/DISCECTOMY FUSION N/A 12/14/2015   Procedure: ANTERIOR CERVICAL DECOMPRESSION/DISCECTOMY FUSION CERVICAL FIVE -SIX;  Surgeon: Earnie Larsson, MD;  Location: [facility];  Service: Neurosurgery;  Laterality: N/A;  . BIOPSY  05/07/2015   Procedure: BIOPSY (Gastric);  Surgeon: [name], MD;  Location: [facility];  Service: Endoscopy;;  . CARDIAC CATHETERIZATION N/A 03/07/2016   Procedure: Left Heart Cath and Coronary Angiography;  Surgeon: Belva Crome, MD;  Location: [facility];  Service: Cardiovascular;  Laterality: N/A;  .  COLONOSCOPY WITH PROPOFOL N/A 05/07/2015   [id number] diverticulosis, multiple colon polyps removed, tubular adenoma, serrated colon polyp. Next colonoscopy October 2019  . CORONARY ARTERY BYPASS GRAFT N/A 03/28/2016   Procedure: CORONARY ARTERY BYPASS GRAFTING (CABG) x 5 USING GREATER SAPHENOUS VEIN;  Surgeon: [name], MD;  Location: [facility];  Service: Open Heart Surgery;  Laterality: N/A;  . ENDOVEIN HARVEST OF GREATER SAPHENOUS VEIN Right 03/28/2016   Procedure: ENDOVEIN  HARVEST OF GREATER SAPHENOUS VEIN;  Surgeon: [name], MD;  Location: [facility];  Service: Open Heart Surgery;  Laterality: Right;  . ESOPHAGEAL DILATION N/A 05/07/2015   Procedure: ESOPHAGEAL DILATION WITH 56FR MALONEY DILATOR;  Surgeon: [name], MD;  Location: [facility];  Service: Endoscopy;  Laterality: N/A;  . ESOPHAGOGASTRODUODENOSCOPY (EGD) WITH PROPOFOL N/A 05/07/2015   RMR: Status post dilation of normal esophagus. Gastritis.  Marland Kitchen KNEE SURGERY Left    arthroscopy  . MANDIBLE FRACTURE SURGERY    . POLYPECTOMY  05/07/2015   Procedure: POLYPECTOMY (Hepatic Flexure, Distal Transverse Colon, Rectal);  Surgeon: [name], MD;  Location: [facility];  Service: Endoscopy;;  . TEE WITHOUT CARDIOVERSION N/A 03/28/2016   Procedure: TRANSESOPHAGEAL ECHOCARDIOGRAM (TEE);  Surgeon: [name], MD;  Location: [facility];  Service: Open Heart Surgery;  Laterality: N/A;  . TONSILLECTOMY         Home Medications    Prior to Admission medications   Medication Sig Start Date End Date Taking? Authorizing Provider  acetaminophen (TYLENOL) 325 MG tablet Take 2 tablets (650 mg total) by mouth every 6 (six) hours as needed for mild pain. 04/03/16   Donielle Liston Alba, PA-C  ALPRAZolam Duanne Moron) 1 MG tablet Take 1 mg by mouth 2 (two) times daily.    Historical Provider, MD  amLODipine (NORVASC) 10 MG tablet Take 10 mg by mouth daily.    Historical Provider, MD  atorvastatin (LIPITOR) 40 MG tablet Take 1 tablet (40 mg total) by mouth at bedtime. 04/03/16 07/02/16  Donielle Liston Alba, PA-C  clopidogrel (PLAVIX) 75 MG tablet Take 1 tablet (75 mg total) by mouth daily. 04/03/16   Donielle Liston Alba, PA-C  divalproex (DEPAKOTE) 500 MG DR tablet Take 3 tablets (1,500 mg total) by mouth 2 (two) times daily. 09/21/15   Penni Bombard, MD  FLUoxetine (PROZAC) 20 MG capsule Take 20 mg by mouth daily.    Historical Provider, MD  furosemide (LASIX) 40 MG tablet Take 1 tablet (40 mg total) by mouth daily. For 5 days  then stop. 04/03/16   Donielle Liston Alba, PA-C  glucose blood (ACCU-CHEK AVIVA) test strip Use to test 4 times a day. E11.65 03/18/16   Cassandria Anger, MD  HYDROcodone-acetaminophen (NORCO) 7.5-325 MG tablet Take 1 tablet by mouth every 4 (four) hours as needed for severe pain. 04/03/16   Donielle Liston Alba, PA-C  insulin aspart (NOVOLOG) 100 UNIT/ML FlexPen Inject 5-11 Units into the skin 3 (three) times daily with meals. 03/11/16   Cassandria Anger, MD  Insulin Glargine (LANTUS SOLOSTAR [place]) Inject 30 Units into the skin at bedtime.    Historical Provider, MD  Lurasidone HCl (LATUDA) 60 MG TABS Take 120 mg by mouth daily.     Historical Provider, MD  potassium chloride 20 MEQ TBCR Take 20 mEq by mouth daily. For 5 days then stop. 04/03/16 04/08/16  Donielle Liston Alba, PA-C  tamsulosin (FLOMAX) 0.4 MG CAPS capsule Take 1 capsule (0.4 mg total) by mouth daily. 04/03/16   Donielle  Liston Alba, PA-C    Family History Family History  Problem Relation Age of Onset  . Diabetes Father   . Hypertension Father   . Arthritis    . Diabetes    . Asthma    . Stroke Sister     Social History Social History  Substance Use Topics  . Smoking status: Current Every Day Smoker    Packs/day: 0.75    Years: 30.00    Types: Cigarettes  . Smokeless tobacco: Never Used     Comment: 09/21/15 trying to quit, < 1 PPD  . Alcohol use No     Comment: 02/11/15 "drink a beer every now and then"  lives at home Lives alone   Allergies   Aspirin and Other   Review of Systems Review of Systems  Constitutional: Negative for fever.  Musculoskeletal: Negative for arthralgias and joint swelling.  Skin: Positive for color change and rash.  Psychiatric/Behavioral: Positive for decreased concentration.  All other systems reviewed and are negative.    Physical Exam Updated Vital Signs BP 136/88   Pulse 76   Temp 99.1 F (37.3 C) (Oral)   Resp 20   Ht 5\' 7"  (1.702 m)   Wt 150 lb (68 kg)   SpO2 98%    BMI 23.49 kg/m   Vital signs normal    Physical Exam  Constitutional: He is oriented to person, place, and time. He appears well-developed and well-nourished.  Non-toxic appearance. He does not appear ill. No distress.  HENT:  Head: Normocephalic and atraumatic.  Right Ear: External ear normal.  Left Ear: External ear normal.  Nose: Nose normal. No mucosal edema or rhinorrhea.  Mouth/Throat: Mucous membranes are normal. No dental abscesses or uvula swelling.  Eyes: Conjunctivae are normal. Pupils are equal, round, and reactive to light.  Neck: Full passive range of motion without pain. Neck supple.  Cardiovascular: Normal rate and regular rhythm.   Pulmonary/Chest: Effort normal. No respiratory distress. He has no rhonchi. He exhibits no crepitus.  Abdominal: Normal appearance.  Musculoskeletal: Normal range of motion. He exhibits edema. He exhibits no tenderness.  Moves all extremities well. Patient is noted to have some mild diffuse swelling of his right leg with some dependent bruising. His leg is soft and he denies tenderness to palpation. This appears to be typical postop changes.  Neurological: He is alert and oriented to person, place, and time. He has normal strength. No cranial nerve deficit.  Skin: Skin is warm, dry and intact. No rash noted. No erythema. No pallor.  Psychiatric: He has a normal mood and affect. His speech is normal and behavior is normal. His mood appears not anxious.  Nursing note and vitals reviewed.         ED Treatments / Results  DIAGNOSTIC STUDIES: Oxygen Saturation is 98% on RA, normal by my interpretation.     Labs (all labs ordered are listed, but only abnormal results are displayed) Results for orders placed or performed during the hospital encounter of [id number]  Basic metabolic panel  Result Value Ref Range   Sodium 138 135 - 145 mmol/L   Potassium 4.1 3.5 - 5.1 mmol/L   Chloride 98 (L) 101 - 111 mmol/L   CO2 29 22 - 32 mmol/L    Glucose, Bld 95 65 - 99 mg/dL   BUN 21 (H) 6 - 20 mg/dL   Creatinine, Ser 1.39 (H) 0.61 - 1.24 mg/dL   Calcium 9.1 8.9 - 10.3 mg/dL   GFR calc  non Af Amer 57 (L) >60 mL/min   GFR calc Af Amer >60 >60 mL/min   Anion gap 11 5 - 15  CBC with Differential  Result Value Ref Range   WBC 6.7 4.0 - 10.5 K/uL   RBC 2.93 (L) 4.22 - 5.81 MIL/uL   Hemoglobin 9.2 (L) 13.0 - 17.0 g/dL   HCT 26.4 (L) 39.0 - 52.0 %   MCV 90.1 78.0 - 100.0 fL   MCH 31.4 26.0 - 34.0 pg   MCHC 34.8 30.0 - 36.0 g/dL   RDW 13.0 11.5 - 15.5 %   Platelets 314 150 - 400 K/uL   Neutrophils Relative % 42 %   Neutro Abs 2.8 1.7 - 7.7 K/uL   Lymphocytes Relative 27 %   Lymphs Abs 1.8 0.7 - 4.0 K/uL   Monocytes Relative 29 %   Monocytes Absolute 2.0 (H) 0.1 - 1.0 K/uL   Eosinophils Relative 2 %   Eosinophils Absolute 0.2 0.0 - 0.7 K/uL   Basophils Relative 0 %   Basophils Absolute 0.0 0.0 - 0.1 K/uL   Laboratory interpretation all normal except stable anemia (was 9.1 on 9/2 and 10.2 on 8/30), stable chronic renal insufficiency    EKG  EKG Interpretation  Date/Time:  Tuesday April 05 2016 22:39:08 EDT Ventricular Rate:  77 PR Interval:    QRS Duration: 72 QT Interval:  300 QTC Calculation: 340 R Axis:   -29 Text Interpretation:  Sinus rhythm Borderline left axis deviation Anteroseptal infarct, age indeterminate ST elevation, consider inferior injury Lateral leads are also involved Since last tracing 29 Mar 2016 T wave inversion now evident in Anteroseptal leads T wave inversion more evident in inf leads Confirmed by [place]  MD-I, Kemo Spruce ([id number]) on 04/06/2016 12:37:30 AM       Radiology No results found.  Procedures Procedures (including critical care time)  Medications Ordered in ED Medications - No data to display   Initial Impression / Assessment and Plan / ED Course  I have reviewed the triage vital signs and the nursing notes.  Pertinent labs & imaging results that were available during my care of  the patient were reviewed by me and considered in my medical decision making (see chart for details).  Clinical Course  11:53 PM Discussed treatment plan with pt at bedside which includes blood work to make sure he isn't anemic and pt agreed to plan.  At time of discharge patient ready to go home. He has typical bruising and mild swelling seen after surgery. He has no fever or pain. He was advised to keep his follow-up appointments with his thoracic surgeon which was Dr. Cyndia Bent.    Final Clinical Impressions(s) / ED Diagnoses   Final diagnoses:  Multiple bruises    Plan discharge  Rolland Porter, MD, Barbette Or, MD 04/06/16 [phone]

## 2016-04-05 NOTE — ED Triage Notes (Signed)
Pt had bypass surgery x 8 days ago; pt has bruising and swelling to right upper and lower leg

## 2016-04-05 NOTE — ED Notes (Signed)
MD at bedside. 

## 2016-04-06 LAB — CBC WITH DIFFERENTIAL/PLATELET
Basophils Absolute: 0 10*3/uL (ref 0.0–0.1)
Basophils Relative: 0 %
EOS ABS: 0.2 10*3/uL (ref 0.0–0.7)
EOS PCT: 2 %
HCT: 26.4 % — ABNORMAL LOW (ref 39.0–52.0)
HEMOGLOBIN: 9.2 g/dL — AB (ref 13.0–17.0)
LYMPHS ABS: 1.8 10*3/uL (ref 0.7–4.0)
Lymphocytes Relative: 27 %
MCH: 31.4 pg (ref 26.0–34.0)
MCHC: 34.8 g/dL (ref 30.0–36.0)
MCV: 90.1 fL (ref 78.0–100.0)
MONOS PCT: 29 %
Monocytes Absolute: 2 10*3/uL — ABNORMAL HIGH (ref 0.1–1.0)
Neutro Abs: 2.8 10*3/uL (ref 1.7–7.7)
Neutrophils Relative %: 42 %
PLATELETS: 314 10*3/uL (ref 150–400)
RBC: 2.93 MIL/uL — ABNORMAL LOW (ref 4.22–5.81)
RDW: 13 % (ref 11.5–15.5)
WBC: 6.7 10*3/uL (ref 4.0–10.5)

## 2016-04-06 LAB — BASIC METABOLIC PANEL
Anion gap: 11 (ref 5–15)
BUN: 21 mg/dL — AB (ref 6–20)
CHLORIDE: 98 mmol/L — AB (ref 101–111)
CO2: 29 mmol/L (ref 22–32)
CREATININE: 1.39 mg/dL — AB (ref 0.61–1.24)
Calcium: 9.1 mg/dL (ref 8.9–10.3)
GFR calc Af Amer: >60 mL/min (ref >60)
GFR calc non Af Amer: 57 mL/min — ABNORMAL LOW (ref >60)
Glucose, Bld: 95 mg/dL (ref 65–99)
Potassium: 4.1 mmol/L (ref 3.5–5.1)
Sodium: 138 mmol/L (ref 135–145)

## 2016-04-06 NOTE — Discharge Instructions (Signed)
This bruising is normal after the type of surgery that you had. Recheck if you get pain, fever. Follow up with Dr Cyndia Bent as you were instructed when you left the hospital.

## 2016-04-06 NOTE — ED Notes (Addendum)
Upon entering room pt was standing up with paper towels over IV site. Pt had removed his IV. Pt stating he wants to go home. Dr Tomi Bamberger informed. Pt is waiting at this time for DC papers.

## 2016-04-07 ENCOUNTER — Telehealth: Payer: Self-pay | Admitting: Adult Health

## 2016-04-07 NOTE — Telephone Encounter (Signed)
Returned pt call. He was demanding that we give him some "iodine" for his wound. I advised pt. To call [facility] and discuss his wound care with them. He is a very aggressive patient, that was upset that we could not give him any "iodine." He stated that his wound is healing well, but he is still in a lot of pain. I advised him to call [facility], just to let them know what is going on. He stated he has an appointment on Monday.

## 2016-04-11 ENCOUNTER — Other Ambulatory Visit: Payer: Self-pay | Admitting: *Deleted

## 2016-04-11 ENCOUNTER — Ambulatory Visit (INDEPENDENT_AMBULATORY_CARE_PROVIDER_SITE_OTHER): Payer: Self-pay | Admitting: *Deleted

## 2016-04-11 DIAGNOSIS — I2510 Atherosclerotic heart disease of native coronary artery without angina pectoris: Secondary | ICD-10-CM

## 2016-04-11 DIAGNOSIS — G8918 Other acute postprocedural pain: Secondary | ICD-10-CM

## 2016-04-11 DIAGNOSIS — R600 Localized edema: Secondary | ICD-10-CM

## 2016-04-11 DIAGNOSIS — Z4802 Encounter for removal of sutures: Secondary | ICD-10-CM

## 2016-04-11 DIAGNOSIS — M7989 Other specified soft tissue disorders: Secondary | ICD-10-CM

## 2016-04-11 DIAGNOSIS — Z951 Presence of aortocoronary bypass graft: Secondary | ICD-10-CM

## 2016-04-11 MED ORDER — POTASSIUM CHLORIDE ER 20 MEQ PO TBCR: 20.0000 meq | EXTENDED_RELEASE_TABLET | Freq: Every day | ORAL | 0 refills | Status: DC

## 2016-04-11 MED ORDER — HYDROCODONE-ACETAMINOPHEN 7.5-325 MG PO TABS: 1.0000 | ORAL_TABLET | ORAL | 0 refills | Status: DC | PRN

## 2016-04-11 MED ORDER — FUROSEMIDE 40 MG PO TABS: 40.0000 mg | ORAL_TABLET | Freq: Every day | ORAL | 0 refills | Status: DC

## 2016-04-12 ENCOUNTER — Ambulatory Visit: Payer: Medicaid Other | Admitting: "Endocrinology

## 2016-04-12 NOTE — Progress Notes (Signed)
Mr. Anzualda returns s/p CABG X 5, 03/28/16, for the removal of his three chest tube sutures. These sites, as well as, his sternal incision and right leg EVH site are very well healed. The sutures were easily removed. Diet and bowels are good. He is having significant swelling of his right lower leg. His discharge Lasix/KCL has been completed.  This will be renewed for 14 days. He will notify us if he does not see noticeable change in the edema. Otherwise, he will return as scheduled with a CXR.

## 2016-04-15 ENCOUNTER — Telehealth: Payer: Self-pay | Admitting: *Deleted

## 2016-04-15 ENCOUNTER — Other Ambulatory Visit: Payer: Self-pay | Admitting: *Deleted

## 2016-04-15 ENCOUNTER — Ambulatory Visit (HOSPITAL_COMMUNITY)
Admission: RE | Admit: 2016-04-15 | Discharge: 2016-04-15 | Disposition: A | Discharge status: Home / Self Care | Payer: Medicaid Other | Source: Ambulatory Visit | Attending: Surgery | Admitting: Surgery

## 2016-04-15 DIAGNOSIS — M7989 Other specified soft tissue disorders: Secondary | ICD-10-CM

## 2016-04-15 DIAGNOSIS — Z951 Presence of aortocoronary bypass graft: Secondary | ICD-10-CM | POA: Insufficient documentation

## 2016-04-15 NOTE — Progress Notes (Signed)
Melvin Taylor has called the office to say that his right EVH leg, s/p CABG, is still just as swollen as it was when he was here for his nurse visit.  He has been taking the prescribed Lasix.  I notified Dr. Cyndia Bent and a  venous duplex was ordered. It is scheduled for today at [facility]. He has been informed and agreed to the test. He will wait for the results and further instructions.

## 2016-04-15 NOTE — Telephone Encounter (Signed)
Dr. Cyndia Bent has ordered Melvin Taylor to take Lasix 80 mg/KCL 40 mg X 2 days and elevate his leg.  He will call us Monday if no improvement. He voiced understanding.

## 2016-04-15 NOTE — Telephone Encounter (Signed)
His venous duplex study on his right leg was negative, reported by [facility] vascular tech. She informed him of the result and he was discharged home. Dr. Cyndia Bent notified.

## 2016-04-18 ENCOUNTER — Ambulatory Visit (INDEPENDENT_AMBULATORY_CARE_PROVIDER_SITE_OTHER): Payer: Medicaid Other | Admitting: Adult Health

## 2016-04-18 ENCOUNTER — Encounter: Payer: Self-pay | Admitting: Adult Health

## 2016-04-18 VITALS — BP 114/84 | HR 86 | Ht 67.0 in | Wt 146.0 lb

## 2016-04-18 DIAGNOSIS — R600 Localized edema: Secondary | ICD-10-CM | POA: Diagnosis not present

## 2016-04-18 DIAGNOSIS — I2510 Atherosclerotic heart disease of native coronary artery without angina pectoris: Secondary | ICD-10-CM

## 2016-04-18 DIAGNOSIS — I10 Essential (primary) hypertension: Secondary | ICD-10-CM | POA: Diagnosis not present

## 2016-04-18 MED ORDER — CLOPIDOGREL BISULFATE 75 MG PO TABS: 75.0000 mg | ORAL_TABLET | Freq: Every day | ORAL | 2 refills | Status: DC

## 2016-04-18 MED ORDER — AMLODIPINE BESYLATE 10 MG PO TABS: 10.0000 mg | ORAL_TABLET | Freq: Every day | ORAL | 2 refills | Status: DC

## 2016-04-18 MED ORDER — ATORVASTATIN CALCIUM 40 MG PO TABS: 40.0000 mg | ORAL_TABLET | Freq: Every day | ORAL | 2 refills | Status: DC

## 2016-04-18 NOTE — Patient Instructions (Signed)
Your physician recommends that you schedule a follow-up appointment in: 3 Months with Dr. Bronson Ing.   Your physician recommends that you continue on your current medications as directed. Please refer to the Current Medication list given to you today.  You have been referred to Cardiac Rehab   If you need a refill on your cardiac medications before your next appointment, please call your pharmacy.  Thank you for choosing [facility]!

## 2016-04-18 NOTE — Progress Notes (Signed)
Name: Melvin Taylor    DOB: [birth year]  Age: 50 y.o.  MR#: [id number]       PCP:  Rosita Fire, MD      Insurance: Payor: MEDICAID [place] / Plan: MEDICAID [place] ACCESS / Product Type: *No Product type* /   CC:    Chief Complaint  Patient presents with  . Coronary Artery Disease    s/p CABG  . Hypertension    VS Vitals:   04/18/16 1357  Weight: 146 lb (66.2 kg)  Height: 5\' 7"  (1.702 m)    Weights Current Weight  04/18/16 146 lb (66.2 kg)  04/05/16 150 lb (68 kg)  04/05/16 152 lb (68.9 kg)    Blood Pressure  BP Readings from Last 3 Encounters:  04/06/16 150/100  04/05/16 138/85  04/03/16 (!) 153/96     Admit date:  (Not on file) Last encounter with RMR:  04/07/2016   Allergy Aspirin and Other  Current Outpatient Prescriptions  Medication Sig Dispense Refill  . acetaminophen (TYLENOL) 325 MG tablet Take 2 tablets (650 mg total) by mouth every 6 (six) hours as needed for mild pain.    Marland Kitchen ALPRAZolam (XANAX) 1 MG tablet Take 1 mg by mouth 2 (two) times daily.    Marland Kitchen amLODipine (NORVASC) 10 MG tablet Take 10 mg by mouth daily.    Marland Kitchen atorvastatin (LIPITOR) 40 MG tablet Take 1 tablet (40 mg total) by mouth at bedtime. 90 tablet 0  . clopidogrel (PLAVIX) 75 MG tablet Take 1 tablet (75 mg total) by mouth daily. 30 tablet 1  . divalproex (DEPAKOTE) 500 MG DR tablet Take 3 tablets (1,500 mg total) by mouth 2 (two) times daily. 540 tablet 4  . FLUoxetine (PROZAC) 20 MG capsule Take 20 mg by mouth daily.    . furosemide (LASIX) 40 MG tablet Take 1 tablet (40 mg total) by mouth daily. 14 tablet 0  . glucose blood (ACCU-CHEK AVIVA) test strip Use to test 4 times a day. E11.65 150 each 5  . HYDROcodone-acetaminophen (NORCO) 7.5-325 MG tablet Take 1 tablet by mouth every 4 (four) hours as needed for severe pain. 30 tablet 0  . insulin aspart (NOVOLOG) 100 UNIT/ML FlexPen Inject 5-11 Units into the skin 3 (three) times daily with meals. 5 pen 2  . Insulin Glargine (LANTUS SOLOSTAR [place]) Inject  30 Units into the skin at bedtime.    . Lurasidone HCl (LATUDA) 60 MG TABS Take 120 mg by mouth daily.     . Potassium Chloride ER 20 MEQ TBCR Take 20 mEq by mouth daily. 14 tablet 0  . tamsulosin (FLOMAX) 0.4 MG CAPS capsule Take 1 capsule (0.4 mg total) by mouth daily. 30 capsule 1   No current facility-administered medications for this visit.     Discontinued Meds:   There are no discontinued medications.  Patient Active Problem List   Diagnosis Date Noted  . CAD (coronary artery disease) 03/28/2016  . Chest pain 03/21/2016  . Hyperlipidemia 03/11/2016  . Ischemic chest pain ([place])   . NSTEMI (non-ST elevated myocardial infarction) ([place])   . Coronary atherosclerosis of native coronary artery 03/03/2016  . Cervical spinal stenosis 12/14/2015  . Loss of weight 08/25/2015  . Essential hypertension, benign 07/23/2015  . Personal history of noncompliance with medical treatment, presenting hazards to health 07/23/2015  . Abdominal pain 06/08/2015  . Constipation 06/08/2015  . History of colonic polyps   . Diverticulosis of colon without hemorrhage   . Mucosal abnormality of stomach   .  Encounter for screening colonoscopy 04/16/2015  . Dysphagia 04/16/2015  . Partial symptomatic epilepsy with complex partial seizures, intractable, without status epilepticus ([place]) 11/10/2014  . DM type 2 causing vascular disease ([place]) 06/29/2014  . Musculoskeletal pain 06/29/2014  . Hyperglycemia without ketosis 09/07/2013  . 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 Knee pain 12/28/2010  . Medial meniscus, posterior horn derangement 12/28/2010    LABS    Component Value Date/Time   NA 138 04/05/2016 2300   NA 136 04/02/2016 0829   NA 137 04/01/2016 0253   K 4.1 04/05/2016 2300   K 4.1 04/02/2016 0829   K 3.8 04/01/2016 0253   CL 98 (L) 04/05/2016 2300   CL 101 04/02/2016 0829   CL 101 04/01/2016 0253   CO2 29 04/05/2016 2300   CO2 29 04/02/2016 0829   CO2 29 04/01/2016 0253    GLUCOSE 95 04/05/2016 2300   GLUCOSE 135 (H) 04/02/2016 0829   GLUCOSE 108 (H) 04/01/2016 0253   BUN 21 (H) 04/05/2016 2300   BUN 20 04/02/2016 0829   BUN 28 (H) 04/01/2016 0253   CREATININE 1.39 (H) 04/05/2016 2300   CREATININE 1.49 (H) 04/02/2016 0829   CREATININE 1.55 (H) 04/01/2016 0253   CREATININE 0.99 03/17/2016 1436   CREATININE 0.91 01/18/2016 0856   CREATININE 0.96 10/19/2015 1006   CALCIUM 9.1 04/05/2016 2300   CALCIUM 8.2 (L) 04/02/2016 0829   CALCIUM 7.9 (L) 04/01/2016 0253   GFRNONAA 57 (L) 04/05/2016 2300   GFRNONAA 52 (L) 04/02/2016 0829   GFRNONAA 50 (L) 04/01/2016 0253   GFRAA >60 04/05/2016 2300   GFRAA >60 04/02/2016 0829   GFRAA 58 (L) 04/01/2016 0253   CMP     Component Value Date/Time   NA 138 04/05/2016 2300   K 4.1 04/05/2016 2300   CL 98 (L) 04/05/2016 2300   CO2 29 04/05/2016 2300   GLUCOSE 95 04/05/2016 2300   BUN 21 (H) 04/05/2016 2300   CREATININE 1.39 (H) 04/05/2016 2300   CREATININE 0.99 03/17/2016 1436   CALCIUM 9.1 04/05/2016 2300   PROT 6.3 (L) 03/24/2016 1531   ALBUMIN 4.0 03/24/2016 1531   AST 22 03/24/2016 1531   ALT 15 (L) 03/24/2016 1531   ALKPHOS 38 03/24/2016 1531   BILITOT 1.1 03/24/2016 1531   GFRNONAA 57 (L) 04/05/2016 2300   GFRAA >60 04/05/2016 2300       Component Value Date/Time   WBC 6.7 04/05/2016 2300   WBC 5.7 04/02/2016 0829   WBC 11.4 (H) 03/30/2016 0352   HGB 9.2 (L) 04/05/2016 2300   HGB 9.1 (L) 04/02/2016 0829   HGB 10.2 (L) 03/30/2016 0352   HCT 26.4 (L) 04/05/2016 2300   HCT 26.5 (L) 04/02/2016 0829   HCT 29.2 (L) 03/30/2016 0352   MCV 90.1 04/05/2016 2300   MCV 90.4 04/02/2016 0829   MCV 88.8 03/30/2016 0352    Lipid Panel     Component Value Date/Time   CHOL 120 03/22/2016 0853   TRIG 37 03/22/2016 0853   HDL 60 03/22/2016 0853   CHOLHDL 2.0 03/22/2016 0853   VLDL 7 03/22/2016 0853   LDLCALC 53 03/22/2016 0853    ABG    Component Value Date/Time   PHART 7.384 03/28/2016 1736    PCO2ART 40.8 03/28/2016 1736   PO2ART 175.0 (H) 03/28/2016 1736   HCO3 24.3 (H) 03/28/2016 1736   TCO2 25 03/29/2016 1654   ACIDBASEDEF 1.0 03/28/2016 1736   O2SAT 100.0 03/28/2016 1736  Lab Results  Component Value Date   TSH 0.58 10/19/2015   BNP (last 3 results) No results for input(s): BNP in the last 8760 hours.  ProBNP (last 3 results) No results for input(s): PROBNP in the last 8760 hours.  Cardiac Panel (last 3 results) No results for input(s): CKTOTAL, CKMB, TROPONINI, RELINDX in the last 72 hours.  Iron/TIBC/Ferritin/ %Sat No results found for: IRON, TIBC, FERRITIN, IRONPCTSAT   EKG Orders placed or performed in visit on 04/13/16  . EKG     Prior Assessment and Plan Problem List as of 04/18/2016 Reviewed: 04/05/2016 11:27 AM by Glade Lloyd, MD     Cardiovascular and Mediastinum   DM type 2 causing vascular disease ([place])   Essential hypertension, benign   Coronary atherosclerosis of native coronary artery   NSTEMI (non-ST elevated myocardial infarction) ([place])   CAD (coronary artery disease)     Digestive   Dysphagia   Last Assessment & Plan 04/16/2015 Office Visit Edited 04/17/2015  4:00 PM by Carlis Stable, NP    Patient with intermittent dysphagia. When he 7 episode needs to swallow copious months of fluids to get food to pass. States that sometimes happens with every meal. Never had an upper endoscopy before. Cannot rule out stricture, web, ring. We'll plan for an endoscopy in addition to his required screening colonoscopy in the OR propofol as noted below. In the meantime, given advice regarding eating smaller meals, chewing adequately, eating softer foods.  Proceed with EGD +/- dilation with Dr. Gala Romney in near future: the risks, benefits, and alternatives have been discussed with the patient in detail. The patient states understanding and desires to proceed.  Procedure and the OR with propofol. See notes regarding procedure below.      Diverticulosis of colon  without hemorrhage   Mucosal abnormality of stomach   Constipation   Last Assessment & Plan 06/08/2015 Office Visit Written 06/08/2015  1:53 PM by Carlis Stable, NP    The patient has some elements constipation including required straining and prolonged time for having a bowel movement although he does have a bowel movement a day. He could be having retained stool and I will order an abdominal x-ray to check for this as well as any overt abnormalities. We'll also started on MiraLAX 17 g once a day to promote easier stool passage. Return for follow-up in 6 weeks.        Nervous and Auditory   Partial symptomatic epilepsy with complex partial seizures, intractable, without status epilepticus (HCC)     Musculoskeletal and Integument   Medial meniscus, posterior horn derangement     Other   Knee pain   Degeneration disease of medial meniscus   Hyperglycemia without ketosis   Musculoskeletal pain   Encounter for screening colonoscopy   Last Assessment & Plan 04/16/2015 Office Visit Written 04/17/2015  3:58 PM by Carlis Stable, NP    Patient referred for screening colonoscopy. Has never had a colonoscopy before. Is having some lower abdominal pain which is crampy and occasionally sharp. Also with dysphagia symptoms as noted above. Admits subjective weight loss of 100 pounds in a few months. Objectively, his weight loss is been much more subtle including 15 pounds the past 9 months and 60 pounds in the past 4-1/2 years. He has diabetic and this could be somewhat related. We'll proceed with colonoscopy in addition to endoscopy as noted above.  Proceed with TCS with Dr. Gala Romney in near future: the risks, benefits, and alternatives  have been discussed with the patient in detail. The patient states understanding and desires to proceed.  The patient is not on any anticoagulants. He is on Depakote, Neurontin, Keppra, Ultram. History of cocaine use although he states he has not used in over a year. History of  marijuana use, although he states he has not recently use. We'll need a urine drug screen at preop visit. Due to medications we'll plan for procedure and the OR propofol.  His blood pressure is a bit high today, he states a new blood pressure medication has been called into his pharmacy but he has not picked it up or become yet. I stressed the importance of following prescribed blood pressure medications and emphasized that his blood pressure is not controlled at his preop visit his procedure we'll likely be canceled. He verbalized understanding.      History of colonic polyps   Abdominal pain   Last Assessment & Plan 08/25/2015 Office Visit Written 08/25/2015  9:57 AM by Mahala Menghini, PA-C    50 year old gentleman who presents for follow-up of abdominal pain, weight loss. Doing very well this time. Symptoms 100% resolved. Has gained 10 pounds since last OV. Currently on PPI as well as MiraLAX when necessary. Bowel function normal. Appetite improved. Continue current regimen. Office visit as needed. Otherwise he will be due for surveillance colonoscopy in 3 years as planned.        Personal history of noncompliance with medical treatment, presenting hazards to health   Loss of weight   Cervical spinal stenosis   Ischemic chest pain ([place])   Hyperlipidemia   Chest pain       Imaging: Dg Chest 2 View  Result Date: 03/24/2016 CLINICAL DATA:  Preop for CABG, smoking history EXAM: CHEST  2 VIEW COMPARISON:  Chest x-ray of 03/02/2016 FINDINGS: No active infiltrate or effusion is seen. Mediastinal and hilar contours are unremarkable. The heart is within normal limits in size. No bony abnormality is seen. A lower anterior cervical spine fusion plate is present. IMPRESSION: No active cardiopulmonary disease. Electronically Signed   By: Ivar Drape M.D.   On: 03/24/2016 15:51   Dg Chest 2 View  Result Date: 03/21/2016 CLINICAL DATA:  Two weeks of chest pain, shortness of breath, left shoulder pain;  history of coronary artery disease with 2 previous MIs, heavy smoking history, diabetes EXAM: CHEST  2 VIEW COMPARISON:  PA and lateral chest x-ray of March 04, 2016 FINDINGS: The lungs are adequately inflated. There is no focal infiltrate. There is a 2 mm diameter projecting over the lateral aspect of the left 6 rib. This is not been previously demonstrated. The heart and pulmonary vascularity are normal. The mediastinum is normal in width. There is no pleural effusion. There is calcification in the wall of the aortic arch. The patient has undergone previous lower anterior cervical fusion. The thoracic vertebral bodies are preserved in height. IMPRESSION: There is no pneumonia nor CHF. **An incidental finding of potential clinical significance has been found. New 2 mm nodule peripherally in the left upper lobe overlying the lateral aspect of the 6th rib. ** Given the patient's long history of smoking, chest CT scanning is recommended for further evaluation of this nodule. These results will be called to the ordering clinician or representative by the Radiologist Assistant, and communication documented in the PACS or zVision Dashboard. Electronically Signed   By: David  Martinique M.D.   On: 03/21/2016 15:49   US Venous Img Lower Unilateral Right  Result Date: 04/15/2016 CLINICAL DATA:  Right lower extremity persistent edema. Recent CABG procedure. EXAM: RIGHT LOWER EXTREMITY VENOUS DOPPLER ULTRASOUND TECHNIQUE: Gray-scale sonography with graded compression, as well as color Doppler and duplex ultrasound, were performed to evaluate the deep venous system from the level of the common femoral vein through the popliteal and proximal calf veins. Spectral Doppler was utilized to evaluate flow at rest and with distal augmentation maneuvers. COMPARISON:  None. FINDINGS: Left common femoral vein is patent without thrombus. The left saphenofemoral junction is patent. Normal compressibility, augmentation and color Doppler  flow in the right common femoral vein, right femoral vein and right popliteal vein. The right saphenofemoral junction is patent. Left profunda femoral vein is patent without thrombus. Visualized right deep calf veins are patent without thrombus. Subcutaneous edema in the right ankle. IMPRESSION: Negative for deep venous thrombosis in right lower extremity. Electronically Signed   By: Markus Daft M.D.   On: 04/15/2016 15:52   Dg Chest Port 1 View  Result Date: 03/30/2016 CLINICAL DATA:  Chest pain EXAM: PORTABLE CHEST 1 VIEW COMPARISON:  March 29, 2016 FINDINGS: Swan-Ganz catheter is been removed. Cordis tip is in the superior vena cava. Mediastinal drain and left chest tube have been removed. There is no demonstrable pneumothorax. Pacemaker wires are attached to the right heart. There is atelectatic change in the lung bases. The lungs elsewhere are clear. Heart is enlarged but stable. The pulmonary vascularity is normal. No adenopathy. There is postoperative change in the lower cervical spine. IMPRESSION: No pneumothorax. Cordis tip in superior vena cava. Bibasilar atelectasis, essentially stable. Stable cardiac prominence. Electronically Signed   By: Lowella Grip III M.D.   On: 03/30/2016 07:09   Dg Chest Port 1 View  Result Date: 03/29/2016 CLINICAL DATA:  Chest tube present. Coronary artery disease with recent coronary artery bypass grafting EXAM: PORTABLE CHEST 1 VIEW COMPARISON:  March 28, 2016 FINDINGS: Endotracheal tube and nasogastric tube have been removed. There are two left chest tubes and a mediastinal drain, unchanged in position. Swan-Ganz catheter tip is in the right main pulmonary artery. No pneumothorax. There is mild atelectasis in the bases. Lungs elsewhere clear. Heart is borderline enlarged with pulmonary vascularity within normal limits. There is atherosclerotic calcification in the aorta. No adenopathy evident. IMPRESSION: Tube and catheter positions as described without apparent  pneumothorax. Mild bibasilar atelectasis. Lungs elsewhere clear. Heart borderline enlarged. Electronically Signed   By: Lowella Grip III M.D.   On: 03/29/2016 07:08   Dg Chest Port 1 View  Result Date: 03/28/2016 CLINICAL DATA:  Hypoxia EXAM: PORTABLE CHEST 1 VIEW COMPARISON:  March 24, 2016 FINDINGS: Endotracheal tube tip is 6.6 cm above the carina. Swan-Ganz catheter tip is in the main pulmonary outflow tract directed to the right. There is a nasogastric tube with tip and side port below the diaphragm. There are two chest tubes on the left and a mediastinal drain. Temporary pacemaker wires are attached to the right heart. No pneumothorax. There is no edema or consolidation. The heart size and pulmonary vascularity are normal. No adenopathy. Patient is status post coronary artery bypass grafting. There is atherosclerotic calcification in the aorta. IMPRESSION: Tube and catheter positions as described without pneumothorax. No edema or consolidation. Cardiac silhouette within normal limits. There is aortic atherosclerosis. Electronically Signed   By: Lowella Grip III M.D.   On: 03/28/2016 13:42

## 2016-04-18 NOTE — Progress Notes (Signed)
Cardiology Office Note   Date:  04/18/2016   ID:  [name], DOB 10-04-1965, MRN [id number]  PCP:  Rosita Fire, MD  Cardiologist: Ross/  Jory Sims, NP   Chief Complaint  Patient presents with  . Coronary Artery Disease    s/p CABG  . Hypertension      History of Present Illness: Melvin Taylor is a 50 y.o. male who presents for posthospitalization follow-up after admission for chest pain. He underwent cardiac catheterization which showed severe multivessel coronary artery disease and total occlusion of the mid RCA, filling by left-to-right collaterals, diffuse 75% mid LAD stenosis and 65% + ostial LCX stenosis with an EF of 55%. He was started on medical treatment with Imdur added to his other meds. He was admitted yesterday with recurrent severe chest pain with exertion. This was associated with radiation to the left arm and SOB, diaphoresis.   The patient subsequently underwent five-vessel coronary artery bypass grafting on 03/28/2016. He was also found to be poorly controlled concerning diabetes with a hemoglobin A1c of 10.4. He was referred to endocrinology. He was started on amlodipine for blood pressure control, lisinopril HCTZ, isosorbide, and atenolol, Plavix 75 mg, and insulin on discharge.  The patient called on 04/07/2016 very demanding and angry requiring iodine for his wound as she states it was not healing well. He also stated he was in a lot of pain. He was referred due to TCTS as we had not seen him. He was also complaining of lower extremity edema and he was started on Lasix 80 mg daily along with potassium 40 mEq daily for 2 days and to keep leg elevated. He was found not to have DVT.   He comes a with continued complaints of soreness in his chest and fatigue. He has quit smoking. He is medically compliant. No further lower extremity edema on the right. He has not yet been referred to cardiac rehabilitation.  Past Medical History:  Diagnosis Date  .  Anxiety   . Bipolar 1 disorder ([place])   . Chest pain   . Chronic back pain   . Coronary artery disease   . Depression   . Diabetes mellitus    Type II  . Hallucinations    "long history of them"  . Headache(784.0)   . History of gout   . HTN (hypertension)   . Insomnia   . Neuropathy ([place])   . Pain management   . Schizophrenia ([place])   . Seizures ([place])    last sz between July 5-9th, 2016; epilepsy  . Shortness of breath dyspnea    with exertion  . Sleep apnea   . Transfusion of blood product refused for religious reason     Past Surgical History:  Procedure Laterality Date  . ANTERIOR CERVICAL DECOMP/DISCECTOMY FUSION N/A 12/14/2015   Procedure: ANTERIOR CERVICAL DECOMPRESSION/DISCECTOMY FUSION CERVICAL FIVE -SIX;  Surgeon: Earnie Larsson, MD;  Location: [facility];  Service: Neurosurgery;  Laterality: N/A;  . BIOPSY  05/07/2015   Procedure: BIOPSY (Gastric);  Surgeon: [name], MD;  Location: [facility];  Service: Endoscopy;;  . CARDIAC CATHETERIZATION N/A 03/07/2016   Procedure: Left Heart Cath and Coronary Angiography;  Surgeon: Belva Crome, MD;  Location: [facility];  Service: Cardiovascular;  Laterality: N/A;  . COLONOSCOPY WITH PROPOFOL N/A 05/07/2015   [id number] diverticulosis, multiple colon polyps removed, tubular adenoma, serrated colon polyp. Next colonoscopy October 2019  . CORONARY ARTERY BYPASS GRAFT N/A 03/28/2016   Procedure:  CORONARY ARTERY BYPASS GRAFTING (CABG) x 5 USING GREATER SAPHENOUS VEIN;  Surgeon: [name], MD;  Location: [facility];  Service: Open Heart Surgery;  Laterality: N/A;  . ENDOVEIN HARVEST OF GREATER SAPHENOUS VEIN Right 03/28/2016   Procedure: ENDOVEIN HARVEST OF GREATER SAPHENOUS VEIN;  Surgeon: [name], MD;  Location: [facility];  Service: Open Heart Surgery;  Laterality: Right;  . ESOPHAGEAL DILATION N/A 05/07/2015   Procedure: ESOPHAGEAL DILATION WITH 56FR MALONEY DILATOR;  Surgeon: [name], MD;  Location: [facility];   Service: Endoscopy;  Laterality: N/A;  . ESOPHAGOGASTRODUODENOSCOPY (EGD) WITH PROPOFOL N/A 05/07/2015   RMR: Status post dilation of normal esophagus. Gastritis.  Marland Kitchen KNEE SURGERY Left    arthroscopy  . MANDIBLE FRACTURE SURGERY    . POLYPECTOMY  05/07/2015   Procedure: POLYPECTOMY (Hepatic Flexure, Distal Transverse Colon, Rectal);  Surgeon: [name], MD;  Location: [facility];  Service: Endoscopy;;  . TEE WITHOUT CARDIOVERSION N/A 03/28/2016   Procedure: TRANSESOPHAGEAL ECHOCARDIOGRAM (TEE);  Surgeon: [name], MD;  Location: [facility];  Service: Open Heart Surgery;  Laterality: N/A;  . TONSILLECTOMY       Current Outpatient Prescriptions  Medication Sig Dispense Refill  . acetaminophen (TYLENOL) 325 MG tablet Take 2 tablets (650 mg total) by mouth every 6 (six) hours as needed for mild pain.    Marland Kitchen ALPRAZolam (XANAX) 1 MG tablet Take 1 mg by mouth 2 (two) times daily.    Marland Kitchen amLODipine (NORVASC) 10 MG tablet Take 1 tablet (10 mg total) by mouth daily. 90 tablet 2  . atorvastatin (LIPITOR) 40 MG tablet Take 1 tablet (40 mg total) by mouth at bedtime. 90 tablet 2  . clopidogrel (PLAVIX) 75 MG tablet Take 1 tablet (75 mg total) by mouth daily. 90 tablet 2  . divalproex (DEPAKOTE) 500 MG DR tablet Take 3 tablets (1,500 mg total) by mouth 2 (two) times daily. 540 tablet 4  . FLUoxetine (PROZAC) 20 MG capsule Take 20 mg by mouth daily.    . furosemide (LASIX) 40 MG tablet Take 1 tablet (40 mg total) by mouth daily. 14 tablet 0  . glucose blood (ACCU-CHEK AVIVA) test strip Use to test 4 times a day. E11.65 150 each 5  . HYDROcodone-acetaminophen (NORCO) 7.5-325 MG tablet Take 1 tablet by mouth every 4 (four) hours as needed for severe pain. 30 tablet 0  . insulin aspart (NOVOLOG) 100 UNIT/ML FlexPen Inject 5-11 Units into the skin 3 (three) times daily with meals. 5 pen 2  . Insulin Glargine (LANTUS SOLOSTAR [place]) Inject 30 Units into the skin at bedtime.    . Lurasidone HCl (LATUDA) 60 MG TABS  Take 120 mg by mouth daily.     . Potassium Chloride ER 20 MEQ TBCR Take 20 mEq by mouth daily. 14 tablet 0  . tamsulosin (FLOMAX) 0.4 MG CAPS capsule Take 1 capsule (0.4 mg total) by mouth daily. 30 capsule 1   No current facility-administered medications for this visit.     Allergies:   Aspirin and Other    Social History:  The patient  reports that he has quit smoking. His smoking use included Cigarettes. He started smoking about 3 weeks ago. He has a 22.50 pack-year smoking history. He has never used smokeless tobacco. He reports that he uses drugs, including Marijuana. He reports that he does not drink alcohol.   Family History:  The patient's family history includes Diabetes in his father; Hypertension in his father; Stroke in  his sister.    ROS: All other systems are reviewed and negative. Unless otherwise mentioned in H&P    PHYSICAL EXAM: VS:  BP 114/84   Pulse 86   Ht 5\' 7"  (1.702 m)   Wt 146 lb (66.2 kg)   SpO2 99%   BMI 22.87 kg/m  , BMI Body mass index is 22.87 kg/m. GEN: Well nourished, well developed, in no acute distress  HEENT: normal  Neck: no JVD, carotid bruits, or masses Cardiac: RRR; distant heart sounds, no murmurs, rubs, or gallops,no edema  Respiratory:  clear to auscultation bilaterally, normal work of breathing GI: soft, nontender, nondistended, + BS MS: no deformity or atrophy  Well healed  Sternotomy site , with iodine noted.  Right lower extremity well healed  vein harvest site. There is no evidence of edema Skin: warm and dry, no rash Neuro:  Strength and sensation are intact Psych: euthymic mood, full affect    Recent Labs: 10/19/2015: TSH 0.58 03/24/2016: ALT 15 03/29/2016: Magnesium 2.0 04/05/2016: BUN 21; Creatinine, Ser 1.39; Hemoglobin 9.2; Platelets 314; Potassium 4.1; Sodium 138    Lipid Panel    Component Value Date/Time   CHOL 120 03/22/2016 0853   TRIG 37 03/22/2016 0853   HDL 60 03/22/2016 0853   CHOLHDL 2.0 03/22/2016 0853    VLDL 7 03/22/2016 0853   LDLCALC 53 03/22/2016 0853      Wt Readings from Last 3 Encounters:  04/18/16 146 lb (66.2 kg)  04/05/16 150 lb (68 kg)  04/05/16 152 lb (68.9 kg)     ASSESSMENT AND PLAN:  1.   Coronary art disease: Status post coronary artery bypass grafting.  He continues generalized fatigue with chest soreness. He is without complaints of dyspnea or dizziness.  I will refer him to cardiac rehab for education and exercise regimen. Will see him in 3 months. He is requesting am appointment. Results are provided for cardiac meds. I'll not provide any pain management medications. He will talk with CVTS concerning management.  2. Hypertension: Excellent control of blood pressure. No changes in medication regimen.  3. Right lower leg edema: Completely resolved with temporary diuresis and potassium replacement. We'll not refill Lasix at this time. I've explained to him that he may have some lower extremity edema on the right as result of his SV harvest. He verbalizes understanding.  4. Insulin-dependent diabetes: He is being followed by endocrinology. He will see them again next week.  .Current medicines are reviewed at length with the patient today.    Labs/ tests ordered today include:   Orders Placed This Encounter  Procedures  . AMB referral to cardiac rehabilitation     Disposition:   FU with 3 months with Dr. Bronson Ing  Signed, Jory Sims, NP  04/18/2016 2:26 PM    [facility] [address] [place], [place] [address] Phone: [phone]; Fax: [phone]

## 2016-04-20 ENCOUNTER — Telehealth: Payer: Self-pay | Admitting: *Deleted

## 2016-04-20 NOTE — Telephone Encounter (Signed)
Mr. Kienitz is s/p CABG 03/28/16, discharged 04/03/16. He has been seen in our office for suture removal only. He was seen on 04/18/16 by Jory Sims, N.P. for cardiology. He has called to relate some sharp midsternal chest pains he has been having that are not completely relieived by his pain medication. Ms. Purcell Nails felt these were natural pains related to healing. I also related the same.  He said they were so sharp that he almost went to the ED. I suggested that he do that if they reoccurred if he needed further reassurance and he agreed.

## 2016-04-21 ENCOUNTER — Ambulatory Visit: Payer: Medicaid Other | Admitting: Adult Health

## 2016-04-26 ENCOUNTER — Encounter: Payer: Self-pay | Admitting: "Endocrinology

## 2016-04-26 ENCOUNTER — Emergency Department (HOSPITAL_COMMUNITY): Payer: Medicaid Other

## 2016-04-26 ENCOUNTER — Ambulatory Visit (INDEPENDENT_AMBULATORY_CARE_PROVIDER_SITE_OTHER): Payer: Medicaid Other | Admitting: "Endocrinology

## 2016-04-26 ENCOUNTER — Encounter (HOSPITAL_COMMUNITY): Payer: Self-pay

## 2016-04-26 ENCOUNTER — Emergency Department (HOSPITAL_COMMUNITY)
Admission: EM | Admit: 2016-04-26 | Discharge: 2016-04-26 | Disposition: A | Discharge status: Home / Self Care | Payer: Medicaid Other | Attending: Emergency Medicine | Admitting: Emergency Medicine

## 2016-04-26 VITALS — BP 125/83 | HR 83 | Ht 67.0 in | Wt 148.0 lb

## 2016-04-26 DIAGNOSIS — E119 Type 2 diabetes mellitus without complications: Secondary | ICD-10-CM | POA: Diagnosis not present

## 2016-04-26 DIAGNOSIS — Z87891 Personal history of nicotine dependence: Secondary | ICD-10-CM | POA: Diagnosis not present

## 2016-04-26 DIAGNOSIS — Z794 Long term (current) use of insulin: Secondary | ICD-10-CM | POA: Insufficient documentation

## 2016-04-26 DIAGNOSIS — Z91199 Patient's noncompliance with other medical treatment and regimen due to unspecified reason: Secondary | ICD-10-CM

## 2016-04-26 DIAGNOSIS — Z79899 Other long term (current) drug therapy: Secondary | ICD-10-CM | POA: Diagnosis not present

## 2016-04-26 DIAGNOSIS — I10 Essential (primary) hypertension: Secondary | ICD-10-CM | POA: Diagnosis not present

## 2016-04-26 DIAGNOSIS — E785 Hyperlipidemia, unspecified: Secondary | ICD-10-CM | POA: Diagnosis not present

## 2016-04-26 DIAGNOSIS — I2510 Atherosclerotic heart disease of native coronary artery without angina pectoris: Secondary | ICD-10-CM | POA: Diagnosis not present

## 2016-04-26 DIAGNOSIS — R079 Chest pain, unspecified: Secondary | ICD-10-CM | POA: Diagnosis not present

## 2016-04-26 DIAGNOSIS — R0789 Other chest pain: Secondary | ICD-10-CM

## 2016-04-26 DIAGNOSIS — Z9119 Patient's noncompliance with other medical treatment and regimen: Secondary | ICD-10-CM | POA: Diagnosis not present

## 2016-04-26 DIAGNOSIS — Z791 Long term (current) use of non-steroidal anti-inflammatories (NSAID): Secondary | ICD-10-CM | POA: Insufficient documentation

## 2016-04-26 DIAGNOSIS — M7989 Other specified soft tissue disorders: Secondary | ICD-10-CM | POA: Diagnosis not present

## 2016-04-26 DIAGNOSIS — E1159 Type 2 diabetes mellitus with other circulatory complications: Secondary | ICD-10-CM | POA: Diagnosis not present

## 2016-04-26 LAB — BASIC METABOLIC PANEL
ANION GAP: 8 (ref 5–15)
BUN: 25 mg/dL — ABNORMAL HIGH (ref 6–20)
CALCIUM: 8.7 mg/dL — AB (ref 8.9–10.3)
CO2: 26 mmol/L (ref 22–32)
Chloride: 104 mmol/L (ref 101–111)
Creatinine, Ser: 1.15 mg/dL (ref 0.61–1.24)
GLUCOSE: 95 mg/dL (ref 65–99)
Potassium: 3.2 mmol/L — ABNORMAL LOW (ref 3.5–5.1)
Sodium: 138 mmol/L (ref 135–145)

## 2016-04-26 LAB — CBC
HCT: 30.3 % — ABNORMAL LOW (ref 39.0–52.0)
HEMOGLOBIN: 10.4 g/dL — AB (ref 13.0–17.0)
MCH: 30.9 pg (ref 26.0–34.0)
MCHC: 34.3 g/dL (ref 30.0–36.0)
MCV: 89.9 fL (ref 78.0–100.0)
PLATELETS: 172 10*3/uL (ref 150–400)
RBC: 3.37 MIL/uL — AB (ref 4.22–5.81)
RDW: 13.8 % (ref 11.5–15.5)
WBC: 4.1 10*3/uL (ref 4.0–10.5)

## 2016-04-26 LAB — TROPONIN I

## 2016-04-26 LAB — PROTIME-INR
INR: 1.02
PROTHROMBIN TIME: 13.4 s (ref 11.4–15.2)

## 2016-04-26 LAB — D-DIMER, QUANTITATIVE: D-Dimer, Quant: 1.35 ug/mL-FEU — ABNORMAL HIGH (ref 0.00–0.50)

## 2016-04-26 MED ORDER — ONDANSETRON HCL 4 MG/2ML IJ SOLN
4.0000 mg | Freq: Once | INTRAMUSCULAR | Status: AC
  Administered 2016-04-26: 4 mg via INTRAVENOUS
  Filled 2016-04-26: qty 2

## 2016-04-26 MED ORDER — MORPHINE SULFATE (PF) 4 MG/ML IV SOLN
4.0000 mg | Freq: Once | INTRAVENOUS | Status: AC
  Administered 2016-04-26: 4 mg via INTRAVENOUS
  Filled 2016-04-26: qty 1

## 2016-04-26 MED ORDER — IOPAMIDOL (ISOVUE-370) INJECTION 76%
100.0000 mL | Freq: Once | INTRAVENOUS | Status: AC | PRN
  Administered 2016-04-26: 100 mL via INTRAVENOUS

## 2016-04-26 NOTE — Progress Notes (Signed)
Subjective:    Patient ID: Melvin Taylor, male    DOB: 01-19-1966, PCP Rosita Fire, MD   Past Medical History:  Diagnosis Date  . Anxiety   . Bipolar 1 disorder ([place])   . Chest pain   . Chronic back pain   . Coronary artery disease   . Depression   . Diabetes mellitus    Type II  . Hallucinations    "long history of them"  . Headache(784.0)   . History of gout   . HTN (hypertension)   . Insomnia   . Neuropathy ([place])   . Pain management   . Schizophrenia ([place])   . Seizures ([place])    last sz between July 5-9th, 2016; epilepsy  . Shortness of breath dyspnea    with exertion  . Sleep apnea   . Transfusion of blood product refused for religious reason    Past Surgical History:  Procedure Laterality Date  . ANTERIOR CERVICAL DECOMP/DISCECTOMY FUSION N/A 12/14/2015   Procedure: ANTERIOR CERVICAL DECOMPRESSION/DISCECTOMY FUSION CERVICAL FIVE -SIX;  Surgeon: Earnie Larsson, MD;  Location: [facility];  Service: Neurosurgery;  Laterality: N/A;  . BIOPSY  05/07/2015   Procedure: BIOPSY (Gastric);  Surgeon: [name], MD;  Location: [facility];  Service: Endoscopy;;  . CARDIAC CATHETERIZATION N/A 03/07/2016   Procedure: Left Heart Cath and Coronary Angiography;  Surgeon: Belva Crome, MD;  Location: [facility];  Service: Cardiovascular;  Laterality: N/A;  . COLONOSCOPY WITH PROPOFOL N/A 05/07/2015   [id number] diverticulosis, multiple colon polyps removed, tubular adenoma, serrated colon polyp. Next colonoscopy October 2019  . CORONARY ARTERY BYPASS GRAFT N/A 03/28/2016   Procedure: CORONARY ARTERY BYPASS GRAFTING (CABG) x 5 USING GREATER SAPHENOUS VEIN;  Surgeon: [name], MD;  Location: [facility];  Service: Open Heart Surgery;  Laterality: N/A;  . ENDOVEIN HARVEST OF GREATER SAPHENOUS VEIN Right 03/28/2016   Procedure: ENDOVEIN HARVEST OF GREATER SAPHENOUS VEIN;  Surgeon: [name], MD;  Location: [facility];  Service: Open Heart Surgery;  Laterality: Right;  .  ESOPHAGEAL DILATION N/A 05/07/2015   Procedure: ESOPHAGEAL DILATION WITH 56FR MALONEY DILATOR;  Surgeon: [name], MD;  Location: [facility];  Service: Endoscopy;  Laterality: N/A;  . ESOPHAGOGASTRODUODENOSCOPY (EGD) WITH PROPOFOL N/A 05/07/2015   RMR: Status post dilation of normal esophagus. Gastritis.  Marland Kitchen KNEE SURGERY Left    arthroscopy  . MANDIBLE FRACTURE SURGERY    . POLYPECTOMY  05/07/2015   Procedure: POLYPECTOMY (Hepatic Flexure, Distal Transverse Colon, Rectal);  Surgeon: [name], MD;  Location: [facility];  Service: Endoscopy;;  . TEE WITHOUT CARDIOVERSION N/A 03/28/2016   Procedure: TRANSESOPHAGEAL ECHOCARDIOGRAM (TEE);  Surgeon: [name], MD;  Location: [facility];  Service: Open Heart Surgery;  Laterality: N/A;  . TONSILLECTOMY     Social History   Social History  . Marital status: Single    Spouse name: N/A  . Number of children: N/A  . Years of education: 11th grade   Occupational History  . unemployed Not Employed   Social History Main Topics  . Smoking status: Former Smoker    Packs/day: 0.75    Years: 30.00    Types: Cigarettes    Start date: 03/28/2016  . Smokeless tobacco: Never Used     Comment: 09/21/15 trying to quit, < 1 PPD  . Alcohol use No     Comment: 02/11/15 "drink a beer every now and then"  . Drug use:  Types: Marijuana     Comment: hx of cocaine use but denies using in over a year, denies marjuana use 01/18/15 and 02/11/15  . Sexual activity: Not Currently   Other Topics Concern  . None   Social History Narrative   Lives alone   Drinks a cup of coffee a day   Outpatient Encounter Prescriptions as of 04/26/2016  Medication Sig  . acetaminophen (TYLENOL) 325 MG tablet Take 2 tablets (650 mg total) by mouth every 6 (six) hours as needed for mild pain.  Marland Kitchen ALPRAZolam (XANAX) 1 MG tablet Take 1 mg by mouth 2 (two) times daily.  Marland Kitchen amLODipine (NORVASC) 10 MG tablet Take 1 tablet (10 mg total) by mouth daily.  Marland Kitchen atorvastatin (LIPITOR) 40 MG  tablet Take 1 tablet (40 mg total) by mouth at bedtime.  . clopidogrel (PLAVIX) 75 MG tablet Take 1 tablet (75 mg total) by mouth daily.  . divalproex (DEPAKOTE) 500 MG DR tablet Take 3 tablets (1,500 mg total) by mouth 2 (two) times daily.  Marland Kitchen FLUoxetine (PROZAC) 20 MG capsule Take 20 mg by mouth daily.  . furosemide (LASIX) 40 MG tablet Take 1 tablet (40 mg total) by mouth daily.  Marland Kitchen glucose blood (ACCU-CHEK AVIVA) test strip Use to test 4 times a day. E11.65  . HYDROcodone-acetaminophen (NORCO) 7.5-325 MG tablet Take 1 tablet by mouth every 4 (four) hours as needed for severe pain.  Marland Kitchen insulin aspart (NOVOLOG) 100 UNIT/ML FlexPen Inject 5-11 Units into the skin 3 (three) times daily with meals.  . Insulin Glargine (LANTUS SOLOSTAR [place]) Inject 30 Units into the skin at bedtime.  . Lurasidone HCl (LATUDA) 60 MG TABS Take 120 mg by mouth daily.   . Potassium Chloride ER 20 MEQ TBCR Take 20 mEq by mouth daily.  . tamsulosin (FLOMAX) 0.4 MG CAPS capsule Take 1 capsule (0.4 mg total) by mouth daily.   No facility-administered encounter medications on file as of 04/26/2016.    ALLERGIES: Allergies  Allergen Reactions  . Aspirin Swelling    SWELLING REACTION UNSPECIFIED   . Other Other (See Comments)    NO BLOOD PRODUCTS per patient's wishes   VACCINATION STATUS: Immunization History  Administered Date(s) Administered  . Influenza,trivalent, recombinat, inj, PF 05/02/2015  . Tdap 01/18/2015    Diabetes  He presents for his follow-up diabetic visit. He has type 2 diabetes mellitus. Onset time: He was diagnosed the approximate age of 59 years. His disease course has been worsening. There are no hypoglycemic associated symptoms. Pertinent negatives for hypoglycemia include no confusion, headaches, pallor or seizures. Associated symptoms include fatigue, polydipsia and polyuria. Pertinent negatives for diabetes include no chest pain, no polyphagia and no weakness. There are no hypoglycemic  complications. Symptoms are worsening. Diabetic complications include heart disease. (He is seriously noncompliant patient. He is now diagnosed with coronary artery disease at [facility] since last visit. He is awaiting for his outpatient cardiology evaluation. This assay to be set for 03/24/2016.) Risk factors for coronary artery disease include diabetes mellitus, dyslipidemia, hypertension, male sex, tobacco exposure and sedentary lifestyle. Current diabetic treatment includes insulin injections. He is compliant with treatment none of the time. His weight is stable. He is following a generally unhealthy diet. When asked about meal planning, he reported none. He has had a previous visit with a dietitian. He never participates in exercise. Home blood sugar record trend: He is logs are significant for  better but persistent hyperglycemia, average for the last 7 days 18 readings  is >200. His breakfast blood glucose range is generally >200 mg/dl. His lunch blood glucose range is generally >200 mg/dl. His dinner blood glucose range is generally >200 mg/dl. His overall blood glucose range is >200 mg/dl. An ACE inhibitor/angiotensin II receptor blocker is not being taken.  Hyperlipidemia  This is a chronic problem. The current episode started more than 1 year ago. Exacerbating diseases include diabetes. Pertinent negatives include no chest pain, myalgias or shortness of breath. Current antihyperlipidemic treatment includes statins. Risk factors for coronary artery disease include diabetes mellitus, dyslipidemia, hypertension, a sedentary lifestyle and male sex.  Hypertension  Pertinent negatives include no chest pain, headaches, neck pain, palpitations or shortness of breath. Risk factors for coronary artery disease include smoking/tobacco exposure, diabetes mellitus, dyslipidemia and male gender.     Review of Systems  Constitutional: Positive for fatigue. Negative for unexpected weight change.  HENT:  Negative for dental problem, mouth sores and trouble swallowing.   Eyes: Negative for visual disturbance.  Respiratory: Negative for cough, choking, chest tightness, shortness of breath and wheezing.   Cardiovascular: Negative for chest pain, palpitations and leg swelling.  Gastrointestinal: Negative for abdominal distention, abdominal pain, constipation, diarrhea, nausea and vomiting.  Endocrine: Positive for polydipsia and polyuria. Negative for polyphagia.  Genitourinary: Negative for dysuria, flank pain, hematuria and urgency.  Musculoskeletal: Negative for back pain, gait problem, myalgias and neck pain.  Skin: Negative for pallor, rash and wound.  Neurological: Negative for seizures, syncope, weakness, numbness and headaches.  Psychiatric/Behavioral: Negative.  Negative for confusion and dysphoric mood.    Objective:    BP 125/83   Pulse 83   Ht 5\' 7"  (1.702 m)   Wt 148 lb (67.1 kg)   BMI 23.18 kg/m   Wt Readings from Last 3 Encounters:  04/26/16 148 lb (67.1 kg)  04/18/16 146 lb (66.2 kg)  04/05/16 150 lb (68 kg)    Physical Exam  Constitutional: He is oriented to person, place, and time. He appears well-developed and well-nourished. He is cooperative. No distress.  HENT:  Head: Normocephalic and atraumatic.  Eyes: EOM are normal.  Neck: Normal range of motion. Neck supple. No tracheal deviation present. No thyromegaly present.  Cardiovascular: Normal rate, S1 normal, S2 normal and normal heart sounds.  Exam reveals no gallop.   No murmur heard. Pulses:      Dorsalis pedis pulses are 1+ on the right side, and 1+ on the left side.       Posterior tibial pulses are 1+ on the right side, and 1+ on the left side.  Pulmonary/Chest: Breath sounds normal. No respiratory distress. He has no wheezes.  Abdominal: Soft. Bowel sounds are normal. He exhibits no distension. There is no tenderness. There is no guarding and no CVA tenderness.  Musculoskeletal: He exhibits no edema.        Right shoulder: He exhibits no swelling and no deformity.  Neurological: He is alert and oriented to person, place, and time. He has normal strength and normal reflexes. No cranial nerve deficit or sensory deficit. Gait normal.  Skin: Skin is warm and dry. No rash noted. No cyanosis. Nails show no clubbing.  Psychiatric: His speech is normal. Cognition and memory are normal.  Noncompliant behavior.   Chemistry (most recent): Lab Results  Component Value Date   NA 138 04/05/2016   K 4.1 04/05/2016   CL 98 (L) 04/05/2016   CO2 29 04/05/2016   BUN 21 (H) 04/05/2016   CREATININE 1.39 (H) 04/05/2016  Diabetic Labs (most recent): Lab Results  Component Value Date   HGBA1C 10.4 (H) 03/24/2016   HGBA1C 10.3 (H) 01/18/2016   HGBA1C 8.3 (H) 10/19/2015   Lipid Panel     Component Value Date/Time   CHOL 120 03/22/2016 0853   TRIG 37 03/22/2016 0853   HDL 60 03/22/2016 0853   CHOLHDL 2.0 03/22/2016 0853   VLDL 7 03/22/2016 0853   LDLCALC 53 03/22/2016 0853    Assessment & Plan:   1. Uncontrolled type 2 diabetes mellitus with complication, with long-term current use of insulin ([place])  - diabetes is  complicated by Coronary artery disease since last visit, noncompliance and patient remains at a high risk for more acute and chronic complications of diabetes which include CAD, CVA, CKD, retinopathy, and neuropathy. These are all discussed in detail with the patient.  Patient came with  his meter, And logs showed  blood glucose averaging more than 200 mg/dL. - This is mainly due to the fact that he did not take his NovoLog with his meals. - his  recent A1c 10.4% , Worsening from 8.3% although generally improving from 15.2 %.  Glucose logs and insulin administration records pertaining to this visit,  to be scanned into patient's records.  Recent labs reviewed.   - I have re-counseled the patient on diet management   by adopting a carbohydrate restricted / protein rich  Diet.  -  Suggestion is made for patient to avoid simple carbohydrates   from their diet including Cakes , Desserts, Ice Cream,  Soda (  diet and regular) , Sweet Tea , Candies,  Chips, Cookies, Artificial Sweeteners,   and "Sugar-free" Products .  This will help patient to have stable blood glucose profile and potentially avoid unintended  Weight gain.  - Patient is advised to stick to a routine mealtimes to eat 3 meals  a day and avoid unnecessary snacks ( to snack only to correct hypoglycemia).  - The patient  has been  scheduled with Jearld Fenton, RDN, CDE for individualized DM education.  - I have approached patient with the following individualized plan to manage diabetes and patient reluctantly accepts .  - Unfortunately Mr. Yon remains alarmingly noncompliant. Based on his glucose profile and complication status, he will continue to need basal/bolus insulin to treat his diabetes. However, unfortunately this patient has never shown adequate engagement for safe use of insulin. I urged him to continue monitoring blood glucose before meals and at bedtime.   - In the meantime, I will continue Lantus  30 units daily at bedtime,  NovoLog 5 units 3 times a day before meals for pre-meal blood glucose above 90 mg/dL plus correction.  he is asked to return in one week with his logs in meter. - I have reemphasized the need for NovoLog for him to control postprandial glycemic profile and for him to benefit from his nutrition.   - He is not a good candidate for incretin therapy nor metformin/SG LT 2 inhibitors . - Patient specific target  for A1c; LDL, HDL, Triglycerides, and  Waist Circumference were discussed in detail.  2) BP/HTN: Uncontrolled. Continue current medications. 3)  Weight/Diet: CDE consult in progress, exercise, and carbohydrates information provided.  4) medical noncompliance: I counseled him to refocus for better care of diabetes. 5) Chronic Care/Health Maintenance:  -Patient  Is on  Statin medications and encouraged to continue to follow up with Ophthalmology, Podiatrist at least yearly or according to recommendations, and advised to quit smoking.  I have recommended yearly flu vaccine and pneumonia vaccination at least every 5 years; moderate intensity exercise for up to 150 minutes weekly; and  sleep for at least 7 hours a day. - I have urged him to maintain his scheduled outpatient cardiology evaluation given his recent diagnosis with coronary artery disease at [facility]. This outpatient cardiology evaluation is said to be scheduled for 03/24/2016. I advised him to go to emergency room if he develops chest pain in the interim.  - 25 minutes of time was spent on the care of this patient , 50% of which was applied for counseling on diabetes complications and their preventions.  - I advised patient to maintain close follow up with [facility], MD for primary care needs.  Patient is asked to bring meter and  blood glucose logs during their next visit.   Follow up plan: -Return in about 10 weeks (around 07/05/2016) for follow up with pre-visit labs, meter, and logs.  Glade Lloyd, MD Phone: [phone]  Fax: [phone]   04/26/2016, 10:49 AM

## 2016-04-26 NOTE — ED Notes (Signed)
Patient states that he did not want any more lab tests. He states he wants to go home and go to sleep.  Dr. Gilford Raid informed.

## 2016-04-26 NOTE — ED Provider Notes (Signed)
[facility] DEPT Provider Note   CSN: [id number] Arrival date & time: 04/26/16  1624     History   Chief Complaint Chief Complaint  Patient presents with  . Chest Pain    HPI Melvin Taylor is a 50 y.o. male.  Pt presents to the ED today with chest pain.  Pt was admitted to [facility] on 8/28 for CPand had a 5 vessel CABG by Dr. Cyndia Bent.  He was d/c'd on 9/3.  The pt has had problems with right sided leg swelling since the surgery.  He had a RLE U/S which was negative for DVT on 9/7.  The pt said he ran out of his pain medications a few days ago and the pain in his chest has worsened.  The pt said that it hurts to take a deep breath and to move.  Pt was brought in via EMS.  He was given 1 nitro which did not help his pain.    Past Medical History:  Diagnosis Date  . Anxiety   . Bipolar 1 disorder ([place])   . Chest pain   . Chronic back pain   . Coronary artery disease   . Depression   . Diabetes mellitus    Type II  . Hallucinations    "long history of them"  . Headache(784.0)   . History of gout   . HTN (hypertension)   . Insomnia   . Neuropathy ([place])   . Pain management   . Schizophrenia ([place])   . Seizures ([place])    last sz between July 5-9th, 2016; epilepsy  . Shortness of breath dyspnea    with exertion  . Sleep apnea   . Transfusion of blood product refused for religious reason     Patient Active Problem List   Diagnosis Date Noted  . CAD (coronary artery disease) 03/28/2016  . Chest pain 03/21/2016  . Hyperlipidemia 03/11/2016  . Ischemic chest pain ([place])   . NSTEMI (non-ST elevated myocardial infarction) ([place])   . Coronary atherosclerosis of native coronary artery 03/03/2016  . Cervical spinal stenosis 12/14/2015  . Loss of weight 08/25/2015  . Essential hypertension, benign 07/23/2015  . Personal history of noncompliance with medical treatment, presenting hazards to health 07/23/2015  . Abdominal pain 06/08/2015  . Constipation 06/08/2015  . History  of colonic polyps   . Diverticulosis of colon without hemorrhage   . Mucosal abnormality of stomach   . Encounter for screening colonoscopy 04/16/2015  . Dysphagia 04/16/2015  . Partial symptomatic epilepsy with complex partial seizures, intractable, without status epilepticus ([place]) 11/10/2014  . DM type 2 causing vascular disease ([place]) 06/29/2014  . Musculoskeletal pain 06/29/2014  . Hyperglycemia without ketosis 09/07/2013  . 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 Knee pain 12/28/2010  . Medial meniscus, posterior horn derangement 12/28/2010    Past Surgical History:  Procedure Laterality Date  . ANTERIOR CERVICAL DECOMP/DISCECTOMY FUSION N/A 12/14/2015   Procedure: ANTERIOR CERVICAL DECOMPRESSION/DISCECTOMY FUSION CERVICAL FIVE -SIX;  Surgeon: Earnie Larsson, MD;  Location: [facility];  Service: Neurosurgery;  Laterality: N/A;  . BIOPSY  05/07/2015   Procedure: BIOPSY (Gastric);  Surgeon: [name], MD;  Location: [facility];  Service: Endoscopy;;  . CARDIAC CATHETERIZATION N/A 03/07/2016   Procedure: Left Heart Cath and Coronary Angiography;  Surgeon: Belva Crome, MD;  Location: [facility] CV LAB;  Service: Cardiovascular;  Laterality: N/A;  . COLONOSCOPY WITH PROPOFOL N/A 05/07/2015   [id number] diverticulosis, multiple colon polyps removed, tubular  adenoma, serrated colon polyp. Next colonoscopy October 2019  . CORONARY ARTERY BYPASS GRAFT N/A 03/28/2016   Procedure: CORONARY ARTERY BYPASS GRAFTING (CABG) x 5 USING GREATER SAPHENOUS VEIN;  Surgeon: [name], MD;  Location: [facility] OR;  Service: Open Heart Surgery;  Laterality: N/A;  . ENDOVEIN HARVEST OF GREATER SAPHENOUS VEIN Right 03/28/2016   Procedure: ENDOVEIN HARVEST OF GREATER SAPHENOUS VEIN;  Surgeon: [name], MD;  Location: [facility];  Service: Open Heart Surgery;  Laterality: Right;  . ESOPHAGEAL DILATION N/A 05/07/2015   Procedure: ESOPHAGEAL DILATION WITH 56FR MALONEY DILATOR;  Surgeon: [name],  MD;  Location: [facility];  Service: Endoscopy;  Laterality: N/A;  . ESOPHAGOGASTRODUODENOSCOPY (EGD) WITH PROPOFOL N/A 05/07/2015   RMR: Status post dilation of normal esophagus. Gastritis.  Marland Kitchen KNEE SURGERY Left    arthroscopy  . MANDIBLE FRACTURE SURGERY    . POLYPECTOMY  05/07/2015   Procedure: POLYPECTOMY (Hepatic Flexure, Distal Transverse Colon, Rectal);  Surgeon: [name], MD;  Location: [facility];  Service: Endoscopy;;  . TEE WITHOUT CARDIOVERSION N/A 03/28/2016   Procedure: TRANSESOPHAGEAL ECHOCARDIOGRAM (TEE);  Surgeon: [name], MD;  Location: [facility];  Service: Open Heart Surgery;  Laterality: N/A;  . TONSILLECTOMY         Home Medications    Prior to Admission medications   Medication Sig Start Date End Date Taking? Authorizing Provider  ALPRAZolam Duanne Moron) 1 MG tablet Take 1 mg by mouth 2 (two) times daily.   Yes Historical Provider, MD  clopidogrel (PLAVIX) 75 MG tablet Take 1 tablet (75 mg total) by mouth daily. 04/18/16  Yes Lendon Colonel, NP  divalproex (DEPAKOTE) 500 MG DR tablet Take 3 tablets (1,500 mg total) by mouth 2 (two) times daily. 09/21/15  Yes Penni Bombard, MD  FLUoxetine (PROZAC) 40 MG capsule Take 40 mg by mouth daily.    Yes Historical Provider, MD  gabapentin (NEURONTIN) 300 MG capsule Take 900 mg by mouth 3 (three) times daily.   Yes Historical Provider, MD  HYDROcodone-acetaminophen (NORCO) 7.5-325 MG tablet Take 1 tablet by mouth every 4 (four) hours as needed for severe pain. 04/11/16  Yes Rexene Alberts, MD  insulin aspart (NOVOLOG) 100 UNIT/ML FlexPen Inject 5-11 Units into the skin 3 (three) times daily with meals. 03/11/16  Yes Cassandria Anger, MD  isosorbide mononitrate (IMDUR) 60 MG 24 hr tablet Take 90 mg by mouth daily.   Yes Historical Provider, MD  levETIRAcetam (KEPPRA) 500 MG tablet Take 500 mg by mouth 2 (two) times daily.   Yes Historical Provider, MD  linagliptin (TRADJENTA) 5 MG TABS tablet Take 5 mg by mouth daily.   Yes  Historical Provider, MD  Lurasidone HCl (LATUDA) 60 MG TABS Take 120 mg by mouth daily.    Yes Historical Provider, MD  ranolazine (RANEXA) 500 MG 12 hr tablet Take 500 mg by mouth 2 (two) times daily.   Yes Historical Provider, MD  tamsulosin (FLOMAX) 0.4 MG CAPS capsule Take 1 capsule (0.4 mg total) by mouth daily. 04/03/16  Yes Donielle Liston Alba, PA-C  acetaminophen (TYLENOL) 325 MG tablet Take 2 tablets (650 mg total) by mouth every 6 (six) hours as needed for mild pain. 04/03/16   Donielle Liston Alba, PA-C  amLODipine (NORVASC) 10 MG tablet Take 1 tablet (10 mg total) by mouth daily. 04/18/16   Lendon Colonel, NP  atorvastatin (LIPITOR) 40 MG tablet Take 1 tablet (40 mg total) by mouth at bedtime. 04/18/16  07/17/16  Lendon Colonel, NP  furosemide (LASIX) 40 MG tablet Take 1 tablet (40 mg total) by mouth daily. 04/11/16   [name], MD  glucose blood (ACCU-CHEK AVIVA) test strip Use to test 4 times a day. E11.65 03/18/16   Cassandria Anger, MD  Insulin Glargine (LANTUS SOLOSTAR [place]) Inject 30 Units into the skin at bedtime.    Historical Provider, MD  Potassium Chloride ER 20 MEQ TBCR Take 20 mEq by mouth daily. 04/11/16 04/25/16  [name], MD    Family History Family History  Problem Relation Age of Onset  . Diabetes Father   . Hypertension Father   . Arthritis    . Diabetes    . Asthma    . Stroke Sister     Social History Social History  Substance Use Topics  . Smoking status: Former Smoker    Packs/day: 0.75    Years: 30.00    Types: Cigarettes    Start date: 03/28/2016  . Smokeless tobacco: Never Used  . Alcohol use No     Allergies   Aspirin and Other   Review of Systems Review of Systems  Cardiovascular: Positive for chest pain and leg swelling.  All other systems reviewed and are negative.    Physical Exam Updated Vital Signs BP 121/81   Pulse 65   Temp 98.7 F (37.1 C) (Oral)   Resp 14   Ht 5\' 7"  (1.702 m)   Wt 148 lb (67.1 kg)    SpO2 100%   BMI 23.18 kg/m   Physical Exam  Constitutional: He is oriented to person, place, and time. He appears well-developed and well-nourished.  HENT:  Head: Normocephalic and atraumatic.  Right Ear: External ear normal.  Left Ear: External ear normal.  Nose: Nose normal.  Mouth/Throat: Oropharynx is clear and moist.  Eyes: Conjunctivae and EOM are normal. Pupils are equal, round, and reactive to light.  Neck: Normal range of motion. Neck supple.  Cardiovascular: Normal rate, regular rhythm, normal heart sounds and intact distal pulses.     Pulmonary/Chest: Effort normal and breath sounds normal.  Abdominal: Soft. Bowel sounds are normal.  Musculoskeletal: Normal range of motion. He exhibits edema.  rle with 2+ pitting edema  Neurological: He is alert and oriented to person, place, and time.  Skin: Skin is warm and dry.  Psychiatric: He has a normal mood and affect. His behavior is normal. Judgment and thought content normal.  Nursing note and vitals reviewed.    ED Treatments / Results  Labs (all labs ordered are listed, but only abnormal results are displayed) Labs Reviewed  BASIC METABOLIC PANEL - Abnormal; Notable for the following:       Result Value   Potassium 3.2 (*)    BUN 25 (*)    Calcium 8.7 (*)    All other components within normal limits  CBC - Abnormal; Notable for the following:    RBC 3.37 (*)    Hemoglobin 10.4 (*)    HCT 30.3 (*)    All other components within normal limits  D-DIMER, QUANTITATIVE (NOT AT [facility]) - Abnormal; Notable for the following:    D-Dimer, Quant 1.35 (*)    All other components within normal limits  TROPONIN I  PROTIME-INR  TROPONIN I    EKG  EKG Interpretation None       Radiology Ct Angio Chest Pe W And/or Wo Contrast  Result Date: 04/26/2016 CLINICAL DATA:  Left-sided chest pain.  Elevated  D-dimer. EXAM: CT ANGIOGRAPHY CHEST WITH CONTRAST TECHNIQUE: Multidetector CT imaging of the chest was performed using  the standard protocol during bolus administration of intravenous contrast. Multiplanar CT image reconstructions and MIPs were obtained to evaluate the vascular anatomy. CONTRAST:  100 mL of Isovue 370 COMPARISON:  Chest x-ray from today and CT scan February 20, 2012 FINDINGS: Cardiovascular: The patient is status post CABG. Coronary artery calcifications are identified, consistent with previous CABG. The heart size is unchanged. Mediastinum/Nodes: Stable axillary node since 2013. No mediastinal or hilar adenopathy. No pulmonary emboli. Lungs/Pleura: No pericardial effusion or right pleural effusion. There is a tiny left pleural effusion. Central airways are normal. No pneumothorax. No suspicious pulmonary nodules. No masses or focal infiltrates. Upper Abdomen: No acute abnormality. Musculoskeletal: No chest wall abnormality. No acute or significant osseous findings. Review of the MIP images confirms the above findings. IMPRESSION: 1. No pulmonary emboli or acute abnormality. Electronically Signed   By: Dorise Bullion III M.D   On: 04/26/2016 18:55   Dg Chest Portable 1 View  Result Date: 04/26/2016 CLINICAL DATA:  Chest pain began today EXAM: PORTABLE CHEST 1 VIEW COMPARISON:  Chest x-ray of 03/30/2016 FINDINGS: No active infiltrate or effusion is seen. Mediastinal and hilar contours are unremarkable. The heart is within normal limits in size. Median sternotomy sutures are noted. IMPRESSION: No active disease. Electronically Signed   By: Ivar Drape M.D.   On: 04/26/2016 17:19    Procedures Procedures (including critical care time)  Medications Ordered in ED Medications  morphine 4 MG/ML injection 4 mg (4 mg Intravenous Given 04/26/16 1659)  ondansetron (ZOFRAN) injection 4 mg (4 mg Intravenous Given 04/26/16 1657)  iopamidol (ISOVUE-370) 76 % injection 100 mL (100 mLs Intravenous Contrast Given 04/26/16 1827)  morphine 4 MG/ML injection 4 mg (4 mg Intravenous Given 04/26/16 1903)     Initial Impression  / Assessment and Plan / ED Course  I have reviewed the triage vital signs and the nursing notes.  Pertinent labs & imaging results that were available during my care of the patient were reviewed by me and considered in my medical decision making (see chart for details).  Clinical Course   Pt requests narcotics for home.  I told him that we would not be able to do that from the ED.  Pain meds per pcp.  Pt refused his second troponin.  He said that he was tired and just wanted to go home and go to sleep.  He is scheduled for cardiac rehab and is excited to start that.  He knows that he is at risk for death at home as we don't have a delta troponin, but he wants to go.  I don't think his pain is cardiac, it is probably from his sternal incision, so I won't make him sign AMA, but he is given instructions to return if pain returns.  Pt instr to take tylenol as needed for pain.  Final Clinical Impressions(s) / ED Diagnoses   Final diagnoses:  Chest pain, unspecified chest pain type  Mid sternal chest pain    New Prescriptions New Prescriptions   No medications on file     Isla Pence, MD 04/26/16 1939

## 2016-04-26 NOTE — ED Triage Notes (Signed)
Pt c/o center to left sided chest pain that started today.  Reports had open heart surgery in august.  Chest also tender to palpate.  EMS administered 1 nitro with no relief.  cbg 118.

## 2016-04-29 ENCOUNTER — Encounter (HOSPITAL_COMMUNITY): Payer: Self-pay | Admitting: *Deleted

## 2016-05-03 ENCOUNTER — Other Ambulatory Visit: Payer: Self-pay | Admitting: Surgery

## 2016-05-03 ENCOUNTER — Emergency Department (HOSPITAL_COMMUNITY): Payer: Medicaid Other

## 2016-05-03 ENCOUNTER — Emergency Department (HOSPITAL_COMMUNITY)
Admission: EM | Admit: 2016-05-03 | Discharge: 2016-05-03 | Disposition: A | Discharge status: Home / Self Care | Payer: Medicaid Other | Attending: Emergency Medicine | Admitting: Emergency Medicine

## 2016-05-03 ENCOUNTER — Encounter (HOSPITAL_COMMUNITY): Payer: Self-pay | Admitting: Emergency Medicine

## 2016-05-03 DIAGNOSIS — Z794 Long term (current) use of insulin: Secondary | ICD-10-CM | POA: Insufficient documentation

## 2016-05-03 DIAGNOSIS — R0602 Shortness of breath: Secondary | ICD-10-CM | POA: Insufficient documentation

## 2016-05-03 DIAGNOSIS — Z951 Presence of aortocoronary bypass graft: Secondary | ICD-10-CM

## 2016-05-03 DIAGNOSIS — Z87891 Personal history of nicotine dependence: Secondary | ICD-10-CM | POA: Insufficient documentation

## 2016-05-03 DIAGNOSIS — R0789 Other chest pain: Secondary | ICD-10-CM | POA: Insufficient documentation

## 2016-05-03 DIAGNOSIS — I2510 Atherosclerotic heart disease of native coronary artery without angina pectoris: Secondary | ICD-10-CM | POA: Diagnosis not present

## 2016-05-03 DIAGNOSIS — I10 Essential (primary) hypertension: Secondary | ICD-10-CM | POA: Diagnosis not present

## 2016-05-03 DIAGNOSIS — E119 Type 2 diabetes mellitus without complications: Secondary | ICD-10-CM | POA: Diagnosis not present

## 2016-05-03 DIAGNOSIS — Z79899 Other long term (current) drug therapy: Secondary | ICD-10-CM | POA: Insufficient documentation

## 2016-05-03 LAB — BASIC METABOLIC PANEL
Anion gap: 4 — ABNORMAL LOW (ref 5–15)
BUN: 16 mg/dL (ref 6–20)
CO2: 27 mmol/L (ref 22–32)
Calcium: 8.7 mg/dL — ABNORMAL LOW (ref 8.9–10.3)
Chloride: 105 mmol/L (ref 101–111)
Creatinine, Ser: 1.02 mg/dL (ref 0.61–1.24)
GFR calc Af Amer: >60 mL/min (ref >60)
GLUCOSE: 121 mg/dL — AB (ref 65–99)
POTASSIUM: 3.7 mmol/L (ref 3.5–5.1)
Sodium: 136 mmol/L (ref 135–145)

## 2016-05-03 LAB — CBC WITH DIFFERENTIAL/PLATELET
Basophils Absolute: 0 10*3/uL (ref 0.0–0.1)
Basophils Relative: 1 %
EOS PCT: 3 %
Eosinophils Absolute: 0.1 10*3/uL (ref 0.0–0.7)
HCT: 31.3 % — ABNORMAL LOW (ref 39.0–52.0)
Hemoglobin: 10.8 g/dL — ABNORMAL LOW (ref 13.0–17.0)
LYMPHS ABS: 2 10*3/uL (ref 0.7–4.0)
LYMPHS PCT: 47 %
MCH: 30.8 pg (ref 26.0–34.0)
MCHC: 34.5 g/dL (ref 30.0–36.0)
MCV: 89.2 fL (ref 78.0–100.0)
MONO ABS: 0.5 10*3/uL (ref 0.1–1.0)
Monocytes Relative: 11 %
Neutro Abs: 1.7 10*3/uL (ref 1.7–7.7)
Neutrophils Relative %: 40 %
PLATELETS: 182 10*3/uL (ref 150–400)
RBC: 3.51 MIL/uL — AB (ref 4.22–5.81)
RDW: 13.4 % (ref 11.5–15.5)
WBC: 4.3 10*3/uL (ref 4.0–10.5)

## 2016-05-03 LAB — TROPONIN I: Troponin I: <0.03 ng/mL (ref <0.03)

## 2016-05-03 MED ORDER — HYDROCODONE-ACETAMINOPHEN 5-325 MG PO TABS
1.0000 | ORAL_TABLET | Freq: Once | ORAL | Status: AC
  Administered 2016-05-03: 1 via ORAL
  Filled 2016-05-03: qty 1

## 2016-05-03 MED ORDER — HYDROCODONE-ACETAMINOPHEN 5-325 MG PO TABS: 1.0000 | ORAL_TABLET | Freq: Four times a day (QID) | ORAL | 0 refills | Status: DC | PRN

## 2016-05-03 NOTE — ED Provider Notes (Signed)
[facility] DEPT Provider Note   CSN: [id number] Arrival date & time: 05/03/16  [id number]     History   Chief Complaint Chief Complaint  Patient presents with  . Chest Pain    HPI Melvin Taylor is a 50 year old man with history of DM2, HTN, CAD s/p 5v CABG 03/28/2016, bipolar 1 d/o, schizophrenia.  HPI He presents with chest pain and dyspnea. He rolled out of bed this morning and fell onto his chest. Constant sharp chest pain is located over his sternum and is worse with movement. It radiates down his left arm. No relieving factors. He reports that the pain is similar to the pain he had with his MI. He has also been having intermittent chest pain 1-2 times a night that wakes him from his sleep. Chest pain occurs with dyspnea and lightheadedness. He has PND and three pillow orthopnea.  Past Medical History:  Diagnosis Date  . Anxiety   . Bipolar 1 disorder ([place])   . Chest pain   . Chronic back pain   . Coronary artery disease   . Depression   . Diabetes mellitus    Type II  . Hallucinations    "long history of them"  . Headache(784.0)   . History of gout   . HTN (hypertension)   . Insomnia   . Neuropathy ([place])   . Pain management   . Schizophrenia ([place])   . Seizures ([place])    last sz between July 5-9th, 2016; epilepsy  . Shortness of breath dyspnea    with exertion  . Sleep apnea   . Transfusion of blood product refused for religious reason     Patient Active Problem List   Diagnosis Date Noted  . CAD (coronary artery disease) 03/28/2016  . Chest pain 03/21/2016  . Hyperlipidemia 03/11/2016  . Ischemic chest pain ([place])   . NSTEMI (non-ST elevated myocardial infarction) ([place])   . Coronary atherosclerosis of native coronary artery 03/03/2016  . Cervical spinal stenosis 12/14/2015  . Loss of weight 08/25/2015  . Essential hypertension, benign 07/23/2015  . Personal history of noncompliance with medical treatment, presenting hazards to health 07/23/2015  .  Abdominal pain 06/08/2015  . Constipation 06/08/2015  . History of colonic polyps   . Diverticulosis of colon without hemorrhage   . Mucosal abnormality of stomach   . Encounter for screening colonoscopy 04/16/2015  . Dysphagia 04/16/2015  . Partial symptomatic epilepsy with complex partial seizures, intractable, without status epilepticus ([place]) 11/10/2014  . DM type 2 causing vascular disease ([place]) 06/29/2014  . Musculoskeletal pain 06/29/2014  . Hyperglycemia without ketosis 09/07/2013  . 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 Knee pain 12/28/2010  . Medial meniscus, posterior horn derangement 12/28/2010    Past Surgical History:  Procedure Laterality Date  . ANTERIOR CERVICAL DECOMP/DISCECTOMY FUSION N/A 12/14/2015   Procedure: ANTERIOR CERVICAL DECOMPRESSION/DISCECTOMY FUSION CERVICAL FIVE -SIX;  Surgeon: Earnie Larsson, MD;  Location: [facility];  Service: Neurosurgery;  Laterality: N/A;  . BIOPSY  05/07/2015   Procedure: BIOPSY (Gastric);  Surgeon: [name], MD;  Location: [facility];  Service: Endoscopy;;  . CARDIAC CATHETERIZATION N/A 03/07/2016   Procedure: Left Heart Cath and Coronary Angiography;  Surgeon: Belva Crome, MD;  Location: [facility];  Service: Cardiovascular;  Laterality: N/A;  . COLONOSCOPY WITH PROPOFOL N/A 05/07/2015   [id number] diverticulosis, multiple colon polyps removed, tubular adenoma, serrated colon polyp. Next colonoscopy October 2019  . CORONARY ARTERY BYPASS GRAFT N/A 03/28/2016  Procedure: CORONARY ARTERY BYPASS GRAFTING (CABG) x 5 USING GREATER SAPHENOUS VEIN;  Surgeon: [name], MD;  Location: [facility] OR;  Service: Open Heart Surgery;  Laterality: N/A;  . ENDOVEIN HARVEST OF GREATER SAPHENOUS VEIN Right 03/28/2016   Procedure: ENDOVEIN HARVEST OF GREATER SAPHENOUS VEIN;  Surgeon: [name], MD;  Location: [facility];  Service: Open Heart Surgery;  Laterality: Right;  . ESOPHAGEAL DILATION N/A 05/07/2015   Procedure:  ESOPHAGEAL DILATION WITH 56FR MALONEY DILATOR;  Surgeon: [name], MD;  Location: [facility];  Service: Endoscopy;  Laterality: N/A;  . ESOPHAGOGASTRODUODENOSCOPY (EGD) WITH PROPOFOL N/A 05/07/2015   RMR: Status post dilation of normal esophagus. Gastritis.  Marland Kitchen KNEE SURGERY Left    arthroscopy  . MANDIBLE FRACTURE SURGERY    . POLYPECTOMY  05/07/2015   Procedure: POLYPECTOMY (Hepatic Flexure, Distal Transverse Colon, Rectal);  Surgeon: [name], MD;  Location: [facility];  Service: Endoscopy;;  . TEE WITHOUT CARDIOVERSION N/A 03/28/2016   Procedure: TRANSESOPHAGEAL ECHOCARDIOGRAM (TEE);  Surgeon: [name], MD;  Location: [facility];  Service: Open Heart Surgery;  Laterality: N/A;  . TONSILLECTOMY         Home Medications    Prior to Admission medications   Medication Sig Start Date End Date Taking? Authorizing Provider  acetaminophen (TYLENOL) 325 MG tablet Take 2 tablets (650 mg total) by mouth every 6 (six) hours as needed for mild pain. 04/03/16   Donielle Liston Alba, PA-C  ALPRAZolam Duanne Moron) 1 MG tablet Take 1 mg by mouth 2 (two) times daily.    Historical Provider, MD  amLODipine (NORVASC) 10 MG tablet Take 1 tablet (10 mg total) by mouth daily. 04/18/16   Lendon Colonel, NP  atorvastatin (LIPITOR) 40 MG tablet Take 1 tablet (40 mg total) by mouth at bedtime. 04/18/16 07/17/16  Lendon Colonel, NP  clopidogrel (PLAVIX) 75 MG tablet Take 1 tablet (75 mg total) by mouth daily. 04/18/16   Lendon Colonel, NP  divalproex (DEPAKOTE) 500 MG DR tablet Take 3 tablets (1,500 mg total) by mouth 2 (two) times daily. 09/21/15   Penni Bombard, MD  FLUoxetine (PROZAC) 40 MG capsule Take 40 mg by mouth daily.     Historical Provider, MD  furosemide (LASIX) 40 MG tablet Take 1 tablet (40 mg total) by mouth daily. 04/11/16   [name], MD  gabapentin (NEURONTIN) 300 MG capsule Take 900 mg by mouth 3 (three) times daily.    Historical Provider, MD  glucose blood (ACCU-CHEK AVIVA) test  strip Use to test 4 times a day. E11.65 03/18/16   Cassandria Anger, MD  HYDROcodone-acetaminophen (NORCO) 5-325 MG tablet Take 1 tablet by mouth every 6 (six) hours as needed for severe pain. 05/03/16   Milagros Loll, MD  insulin aspart (NOVOLOG) 100 UNIT/ML FlexPen Inject 5-11 Units into the skin 3 (three) times daily with meals. 03/11/16   Cassandria Anger, MD  Insulin Glargine (LANTUS SOLOSTAR [place]) Inject 30 Units into the skin at bedtime.    Historical Provider, MD  isosorbide mononitrate (IMDUR) 60 MG 24 hr tablet Take 90 mg by mouth daily.    Historical Provider, MD  levETIRAcetam (KEPPRA) 500 MG tablet Take 500 mg by mouth 2 (two) times daily.    Historical Provider, MD  linagliptin (TRADJENTA) 5 MG TABS tablet Take 5 mg by mouth daily.    Historical Provider, MD  Lurasidone HCl (LATUDA) 60 MG TABS Take 120 mg by mouth daily.  Historical Provider, MD  Potassium Chloride ER 20 MEQ TBCR Take 20 mEq by mouth daily. 04/11/16 04/25/16  [name], MD  ranolazine (RANEXA) 500 MG 12 hr tablet Take 500 mg by mouth 2 (two) times daily.    Historical Provider, MD  tamsulosin (FLOMAX) 0.4 MG CAPS capsule Take 1 capsule (0.4 mg total) by mouth daily. 04/03/16   Donielle Liston Alba, PA-C    Family History Family History  Problem Relation Age of Onset  . Diabetes Father   . Hypertension Father   . Arthritis    . Diabetes    . Asthma    . Stroke Sister     Social History Social History  Substance Use Topics  . Smoking status: Former Smoker    Packs/day: 0.75    Years: 30.00    Types: Cigarettes    Start date: 03/28/2016  . Smokeless tobacco: Never Used  . Alcohol use No     Allergies   Aspirin and Other   Review of Systems Review of Systems Constitutional: no fevers, +chills Eyes: no vision changes Ears, nose, mouth, throat, and face: +cough productive of clear sputum Respiratory: +shortness of breath Cardiovascular: +chest pain Gastrointestinal: no nausea/vomiting,  no abdominal pain, no constipation, no diarrhea Genitourinary: no dysuria, no hematuria Integument: no rash Hematologic/lymphatic: no bleeding/bruising, no edema Musculoskeletal: no arthralgias, no myalgias Neurological: +paresthesias in L chest and R leg, no weakness  Physical Exam Updated Vital Signs BP 158/99 (BP Location: Left Arm)   Pulse 68   Temp 98.4 F (36.9 C) (Oral)   Resp 20   Ht 5\' 7"  (1.702 m)   Wt 67.1 kg   SpO2 99%   BMI 23.18 kg/m   Physical Exam General Apperance: NAD Head: Normocephalic, atraumatic Eyes: PERRL, EOMI, anicteric sclera Ears: Normal external ear canal Nose: Nares normal, septum midline, mucosa normal Throat: Lips, mucosa and tongue normal  Neck: Supple, trachea midline Back: No tenderness or bony abnormality  Lungs: Clear to auscultation bilaterally. No wheezes, rhonchi or rales. Breathing comfortably Chest Wall: Tender to palpation along sternum, well healing incision from midline sternotomy clean, dry and intact without surrounding erythema or drainage. Heart: Regular rate and rhythm, no murmur/rub/gallop Abdomen: Soft, nontender, nondistended, no rebound/guarding Extremities: Normal, atraumatic, warm and well perfused, no edema Pulses: 2+ throughout Skin: No rashes or lesions Neurologic: Alert and oriented x 3. CNII-XII intact. Normal strength and sensation   ED Treatments / Results  Labs (all labs ordered are listed, but only abnormal results are displayed) Labs Reviewed  CBC WITH DIFFERENTIAL/PLATELET - Abnormal; Notable for the following:       Result Value   RBC 3.51 (*)    Hemoglobin 10.8 (*)    HCT 31.3 (*)    All other components within normal limits  BASIC METABOLIC PANEL - Abnormal; Notable for the following:    Glucose, Bld 121 (*)    Calcium 8.7 (*)    Anion gap 4 (*)    All other components within normal limits  TROPONIN I    EKG  EKG Interpretation  Date/Time:  Tuesday May 03 2016 06:55:06  EDT Ventricular Rate:  65 PR Interval:    QRS Duration: 94 QT Interval:  421 QTC Calculation: 438 R Axis:   -57 Text Interpretation:  Sinus rhythm Left anterior fascicular block Anterior infarct, old No significant change was found Confirmed by Wyvonnia Dusky  MD, STEPHEN [id number]) on 05/03/2016 7:00:36 AM       Radiology Dg Chest 2  View  Result Date: 05/03/2016 CLINICAL DATA:  50 year old male with left-sided chest pain and shortness breath with cough. Diabetes hypertension coronary artery disease. Subsequent encounter. EXAM: CHEST  2 VIEW COMPARISON:  04/26/2016 chest x-ray and CT. FINDINGS: Post CABG.  Heart size top-normal. No infiltrate, congestive heart failure or pneumothorax. Partially calcified aorta. Post cervical spine surgery. IMPRESSION: Post CABG. No infiltrate or congestive heart. Aortic atherosclerosis. Electronically Signed   By: Genia Del M.D.   On: 05/03/2016 07:48    Procedures Procedures  None  Medications Ordered in ED Medications  HYDROcodone-acetaminophen (NORCO/VICODIN) 5-325 MG per tablet 1-2 tablet (1 tablet Oral Given 05/03/16 0753)     Initial Impression / Assessment and Plan / ED Course  I have reviewed the triage vital signs and the nursing notes.  Pertinent labs & imaging results that were available during my care of the patient were reviewed by me and considered in my medical decision making (see chart for details).  Clinical Course   7:34am Norco 5-325mg  for chest pain  Final Clinical Impressions(s) / ED Diagnoses   Final diagnoses:  Chest wall pain   Presenting chest pain occurred after falling from bed. EKG without acute ischemic changes and troponin negative. Hgb stable from previous measurement. No leukocytosis and afebrile. No infiltrate or congestive heart on CXR. Consistent with chest wall pain from contusion.   Pain control with Norco 5-325mg  q6hr prn Incentive spirometer Follow up with cardiac surgery as previously scheduled Follow up  with PCP in 1 week.  New Prescriptions New Prescriptions   HYDROCODONE-ACETAMINOPHEN (NORCO) 5-325 MG TABLET    Take 1 tablet by mouth every 6 (six) hours as needed for severe pain.     Milagros Loll, MD 05/03/16 [id number]    [name], MD 05/03/16 [phone]

## 2016-05-03 NOTE — ED Triage Notes (Signed)
Per ems: pt reports sob, chest wall pain since this morning.  Pt had 5way bypass this past august.  Pt rolled out of bed this morning.  Pt has pain every time he moves.  12 lead ekg with ems negative.  98%ra 150/102.

## 2016-05-03 NOTE — Discharge Instructions (Signed)
Please follow up with your heart surgeons as scheduled and your primary care in 1 week If you take hydrocodone, take an over the counter stool softener with it to prevent constipation Use the incentive spirometer to do breathing exercises

## 2016-05-03 NOTE — ED Notes (Signed)
Patient on cardiac monitoring. Patient given yellow fall risk bracelet.

## 2016-05-04 ENCOUNTER — Encounter: Payer: Self-pay | Admitting: Surgery

## 2016-05-04 ENCOUNTER — Ambulatory Visit (INDEPENDENT_AMBULATORY_CARE_PROVIDER_SITE_OTHER): Payer: Self-pay | Admitting: Surgery

## 2016-05-04 ENCOUNTER — Ambulatory Visit
Admission: RE | Admit: 2016-05-04 | Discharge: 2016-05-04 | Disposition: A | Discharge status: Home / Self Care | Payer: Medicaid Other | Source: Ambulatory Visit | Attending: Surgery | Admitting: Surgery

## 2016-05-04 ENCOUNTER — Telehealth: Payer: Self-pay | Admitting: Adult Health

## 2016-05-04 VITALS — BP 118/82 | HR 90 | Resp 20 | Ht 67.0 in | Wt 148.0 lb

## 2016-05-04 DIAGNOSIS — Z951 Presence of aortocoronary bypass graft: Secondary | ICD-10-CM

## 2016-05-04 DIAGNOSIS — I2510 Atherosclerotic heart disease of native coronary artery without angina pectoris: Secondary | ICD-10-CM

## 2016-05-04 NOTE — Telephone Encounter (Signed)
Pt called stating the Dr. Parks Ranger did his surgery will not give him any more pain medicine. He would like to know if he could get some filled   HYDROcodone-acetaminophen (NORCO) 5-325 MG tablet [id number]

## 2016-05-04 NOTE — Telephone Encounter (Signed)
Spoke with patient after he has called numerous times stating that he needs more pain medication and he went to the doctor that done his open heart surgery and they refused to write him another prescription for his hydrocodone. I apologized to the pt and let him know that our office does not prescribe pain medication and that he will have to contact either his pcp or make another appointment with the office where the doctor is that done his surgery to let them know he is still in pain. Pt was very rude and hostile, but did voice understanding.

## 2016-05-09 ENCOUNTER — Encounter: Payer: Self-pay | Admitting: Surgery

## 2016-05-09 NOTE — Progress Notes (Signed)
HPI: Patient returns for routine postoperative follow-up having undergone CABG x 5 on 03/28/2016. The patient's early postoperative recovery while in the hospital was notable for a slow course due to bipolar disorder and some altered mental status postop. This resolved after a few days. Since hospital discharge the patient reports that he has been feeling well. He is walking daily without chest pain or shortness of breath. He is anxious to start cardiac rehab but has not heard from them yet.   Current Outpatient Prescriptions  Medication Sig Dispense Refill  . acetaminophen (TYLENOL) 325 MG tablet Take 2 tablets (650 mg total) by mouth every 6 (six) hours as needed for mild pain.    Marland Kitchen ALPRAZolam (XANAX) 1 MG tablet Take 1 mg by mouth 2 (two) times daily.    Marland Kitchen amLODipine (NORVASC) 10 MG tablet Take 1 tablet (10 mg total) by mouth daily. 90 tablet 2  . atorvastatin (LIPITOR) 40 MG tablet Take 1 tablet (40 mg total) by mouth at bedtime. 90 tablet 2  . clopidogrel (PLAVIX) 75 MG tablet Take 1 tablet (75 mg total) by mouth daily. 90 tablet 2  . divalproex (DEPAKOTE) 500 MG DR tablet Take 3 tablets (1,500 mg total) by mouth 2 (two) times daily. 540 tablet 4  . FLUoxetine (PROZAC) 40 MG capsule Take 40 mg by mouth daily.     . furosemide (LASIX) 40 MG tablet Take 1 tablet (40 mg total) by mouth daily. 14 tablet 0  . gabapentin (NEURONTIN) 300 MG capsule Take 900 mg by mouth 3 (three) times daily.    Marland Kitchen glucose blood (ACCU-CHEK AVIVA) test strip Use to test 4 times a day. E11.65 150 each 5  . HYDROcodone-acetaminophen (NORCO) 5-325 MG tablet Take 1 tablet by mouth every 6 (six) hours as needed for severe pain. 8 tablet 0  . insulin aspart (NOVOLOG) 100 UNIT/ML FlexPen Inject 5-11 Units into the skin 3 (three) times daily with meals. 5 pen 2  . Insulin Glargine (LANTUS SOLOSTAR [place]) Inject 30 Units into the skin at bedtime.    . isosorbide mononitrate (IMDUR) 60 MG 24 hr tablet Take 90 mg by mouth  daily.    Marland Kitchen levETIRAcetam (KEPPRA) 500 MG tablet Take 500 mg by mouth 2 (two) times daily.    Marland Kitchen linagliptin (TRADJENTA) 5 MG TABS tablet Take 5 mg by mouth daily.    . Lurasidone HCl (LATUDA) 60 MG TABS Take 120 mg by mouth daily.     . ranolazine (RANEXA) 500 MG 12 hr tablet Take 500 mg by mouth 2 (two) times daily.    . tamsulosin (FLOMAX) 0.4 MG CAPS capsule Take 1 capsule (0.4 mg total) by mouth daily. 30 capsule 1  . Potassium Chloride ER 20 MEQ TBCR Take 20 mEq by mouth daily. 14 tablet 0   No current facility-administered medications for this visit.     Physical Exam: BP 118/82   Pulse 90   Resp 20   Ht 5\' 7"  (1.702 m)   Wt 148 lb (67.1 kg)   SpO2 99% Comment: RA  BMI 23.18 kg/m  He looks well. Lung exam is clear. Cardiac exam shows a regular rate and rhythm with normal heart sounds. Chest incision is healing well and sternum is stable. The leg incisions are healing well and there is no peripheral edema.   Diagnostic Tests:  CLINICAL DATA:  Post CABG on 03/28/2016, anterior chest discomfort, shortness of breath with exertion, fatigue  EXAM: CHEST  2 VIEW  COMPARISON:  Chest x-ray of 05/03/2016  FINDINGS: No active infiltrate or effusion is seen. Mediastinal and hilar contours are unremarkable. The heart is mildly enlarged and stable. Median sternotomy sutures are noted from prior CABG. No acute bony abnormality is seen. A lower anterior cervical spine fusion plate is present.  IMPRESSION: Stable borderline cardiomegaly.  No active lung disease.   Electronically Signed   By: Ivar Drape M.D.   On: 05/04/2016 08:53   Impression:  Overall I think he is doing well. I encouraged him to continue walking. He is planning to participate in cardiac rehab and we will check to make sure that referral is in.  I told him he could drive his car but should not lift anything heavier than 10 lbs for three months postop.   Plan:  He will continue to follow up  with cardiology and will contact me if he has any problems with his incisions.   [name], MD [facility] [phone]

## 2016-05-23 ENCOUNTER — Encounter (HOSPITAL_COMMUNITY): Admission: RE | Admit: 2016-05-23 | Payer: Medicaid Other | Source: Ambulatory Visit

## 2016-06-07 ENCOUNTER — Encounter (HOSPITAL_COMMUNITY): Payer: Medicaid Other

## 2016-06-18 ENCOUNTER — Emergency Department (HOSPITAL_COMMUNITY): Payer: Medicaid Other

## 2016-06-18 ENCOUNTER — Emergency Department (HOSPITAL_COMMUNITY)
Admission: EM | Admit: 2016-06-18 | Discharge: 2016-06-18 | Disposition: A | Discharge status: Home / Self Care | Payer: Medicaid Other | Attending: Emergency Medicine | Admitting: Emergency Medicine

## 2016-06-18 ENCOUNTER — Encounter (HOSPITAL_COMMUNITY): Payer: Self-pay | Admitting: Emergency Medicine

## 2016-06-18 DIAGNOSIS — M549 Dorsalgia, unspecified: Secondary | ICD-10-CM | POA: Insufficient documentation

## 2016-06-18 DIAGNOSIS — E119 Type 2 diabetes mellitus without complications: Secondary | ICD-10-CM | POA: Insufficient documentation

## 2016-06-18 DIAGNOSIS — Z79899 Other long term (current) drug therapy: Secondary | ICD-10-CM | POA: Insufficient documentation

## 2016-06-18 DIAGNOSIS — Z794 Long term (current) use of insulin: Secondary | ICD-10-CM | POA: Insufficient documentation

## 2016-06-18 DIAGNOSIS — M7989 Other specified soft tissue disorders: Secondary | ICD-10-CM | POA: Diagnosis not present

## 2016-06-18 DIAGNOSIS — Z87891 Personal history of nicotine dependence: Secondary | ICD-10-CM | POA: Diagnosis not present

## 2016-06-18 DIAGNOSIS — R0602 Shortness of breath: Secondary | ICD-10-CM | POA: Insufficient documentation

## 2016-06-18 DIAGNOSIS — R079 Chest pain, unspecified: Secondary | ICD-10-CM | POA: Diagnosis not present

## 2016-06-18 DIAGNOSIS — I10 Essential (primary) hypertension: Secondary | ICD-10-CM | POA: Insufficient documentation

## 2016-06-18 DIAGNOSIS — R509 Fever, unspecified: Secondary | ICD-10-CM | POA: Insufficient documentation

## 2016-06-18 DIAGNOSIS — I2510 Atherosclerotic heart disease of native coronary artery without angina pectoris: Secondary | ICD-10-CM | POA: Diagnosis not present

## 2016-06-18 LAB — I-STAT TROPONIN, ED: Troponin i, poc: 0 ng/mL (ref 0.00–0.08)

## 2016-06-18 LAB — BASIC METABOLIC PANEL
Anion gap: 3 — ABNORMAL LOW (ref 5–15)
BUN: 18 mg/dL (ref 6–20)
CO2: 31 mmol/L (ref 22–32)
Calcium: 8.5 mg/dL — ABNORMAL LOW (ref 8.9–10.3)
Chloride: 105 mmol/L (ref 101–111)
Creatinine, Ser: 1.06 mg/dL (ref 0.61–1.24)
GFR calc Af Amer: >60 mL/min (ref >60)
GFR calc non Af Amer: >60 mL/min (ref >60)
Glucose, Bld: 84 mg/dL (ref 65–99)
Potassium: 4.2 mmol/L (ref 3.5–5.1)
Sodium: 139 mmol/L (ref 135–145)

## 2016-06-18 LAB — CBC
HEMATOCRIT: 36.1 % — AB (ref 39.0–52.0)
Hemoglobin: 12.7 g/dL — ABNORMAL LOW (ref 13.0–17.0)
MCH: 30.8 pg (ref 26.0–34.0)
MCHC: 35.2 g/dL (ref 30.0–36.0)
MCV: 87.6 fL (ref 78.0–100.0)
PLATELETS: 189 10*3/uL (ref 150–400)
RBC: 4.12 MIL/uL — AB (ref 4.22–5.81)
RDW: 12.8 % (ref 11.5–15.5)
WBC: 4.7 10*3/uL (ref 4.0–10.5)

## 2016-06-18 MED ORDER — HYDROCODONE-ACETAMINOPHEN 5-325 MG PO TABS
1.0000 | ORAL_TABLET | Freq: Once | ORAL | Status: AC
  Administered 2016-06-18: 1 via ORAL
  Filled 2016-06-18: qty 1

## 2016-06-18 MED ORDER — HYDROCODONE-ACETAMINOPHEN 5-325 MG PO TABS: 1.0000 | ORAL_TABLET | Freq: Four times a day (QID) | ORAL | 0 refills | Status: DC | PRN

## 2016-06-18 NOTE — ED Provider Notes (Signed)
[facility] DEPT Provider Note   CSN: [id number] Arrival date & time: 06/18/16  [id number]  By signing my name below, I, Hansel Feinstein, attest that this documentation has been prepared under the direction and in the presence of Fredia Sorrow, MD. Electronically Signed: Hansel Feinstein, ED Scribe. 06/18/16. 8:50 AM.     History   Chief Complaint Chief Complaint  Patient presents with  . Chest Pain    HPI Melvin Taylor is a 50 y.o. male brought in by ambulance with h/o CAD, DM who presents to the Emergency Department complaining of moderate, 9/10, constant left-sided CP with radiation to the left arm onset at 4AM this morning. Pt also complains of subjective fever, SOB, leg swelling, back pain. Pt also complains of some persistent pain in his right leg at the site of a 5 vessel CABG 03/28/16. Pt denies taking OTC or prescription medications at home to improve symptoms as he ran out 2 weeks ago. Pt denies congestion, rhinorrhea, sore throat, visual disturbance, cough, abdominal pain, diarrhea, nausea, vomiting, dysuria, hematuria, neck pain, rash, HA, bruising easily.    PCPRosita Fire, MD    The history is provided by the patient. No language interpreter was used.  Chest Pain   This is a new problem. The current episode started 3 to 5 hours ago. The problem has not changed since onset.The pain is associated with rest. The pain is present in the lateral region. The pain is at a severity of 9/10. The pain is moderate. The pain radiates to the left arm. Duration of episode(s) is 4 hours. Associated symptoms include back pain, a fever and shortness of breath. Pertinent negatives include no abdominal pain, no cough, no headaches, no nausea and no vomiting. He has tried nothing for the symptoms. The treatment provided no relief.  His past medical history is significant for CAD and diabetes. Past workup comments: 5 vessel CABG 03/28/16.    Past Medical History:  Diagnosis Date  . Anxiety   .  Bipolar 1 disorder ([place])   . Chest pain   . Chronic back pain   . Coronary artery disease   . Depression   . Diabetes mellitus    Type II  . Hallucinations    "long history of them"  . Headache(784.0)   . History of gout   . HTN (hypertension)   . Insomnia   . Neuropathy ([place])   . Pain management   . Schizophrenia ([place])   . Seizures ([place])    last sz between July 5-9th, 2016; epilepsy  . Shortness of breath dyspnea    with exertion  . Sleep apnea   . Transfusion of blood product refused for religious reason     Patient Active Problem List   Diagnosis Date Noted  . CAD (coronary artery disease) 03/28/2016  . Chest pain 03/21/2016  . Hyperlipidemia 03/11/2016  . Ischemic chest pain ([place])   . NSTEMI (non-ST elevated myocardial infarction) ([place])   . Coronary atherosclerosis of native coronary artery 03/03/2016  . Cervical spinal stenosis 12/14/2015  . Loss of weight 08/25/2015  . Essential hypertension, benign 07/23/2015  . Personal history of noncompliance with medical treatment, presenting hazards to health 07/23/2015  . Abdominal pain 06/08/2015  . Constipation 06/08/2015  . History of colonic polyps   . Diverticulosis of colon without hemorrhage   . Mucosal abnormality of stomach   . Encounter for screening colonoscopy 04/16/2015  . Dysphagia 04/16/2015  . Partial symptomatic epilepsy with complex partial seizures, intractable,  without status epilepticus ([place]) 11/10/2014  . DM type 2 causing vascular disease ([place]) 06/29/2014  . Musculoskeletal pain 06/29/2014  . Hyperglycemia without ketosis 09/07/2013  . 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 Knee pain 12/28/2010  . Medial meniscus, posterior horn derangement 12/28/2010    Past Surgical History:  Procedure Laterality Date  . ANTERIOR CERVICAL DECOMP/DISCECTOMY FUSION N/A 12/14/2015   Procedure: ANTERIOR CERVICAL DECOMPRESSION/DISCECTOMY FUSION CERVICAL FIVE -SIX;  Surgeon: Earnie Larsson, MD;   Location: [facility];  Service: Neurosurgery;  Laterality: N/A;  . BIOPSY  05/07/2015   Procedure: BIOPSY (Gastric);  Surgeon: [name], MD;  Location: [facility];  Service: Endoscopy;;  . CARDIAC CATHETERIZATION N/A 03/07/2016   Procedure: Left Heart Cath and Coronary Angiography;  Surgeon: Belva Crome, MD;  Location: [facility];  Service: Cardiovascular;  Laterality: N/A;  . COLONOSCOPY WITH PROPOFOL N/A 05/07/2015   [id number] diverticulosis, multiple colon polyps removed, tubular adenoma, serrated colon polyp. Next colonoscopy October 2019  . CORONARY ARTERY BYPASS GRAFT N/A 03/28/2016   Procedure: CORONARY ARTERY BYPASS GRAFTING (CABG) x 5 USING GREATER SAPHENOUS VEIN;  Surgeon: [name], MD;  Location: [facility];  Service: Open Heart Surgery;  Laterality: N/A;  . ENDOVEIN HARVEST OF GREATER SAPHENOUS VEIN Right 03/28/2016   Procedure: ENDOVEIN HARVEST OF GREATER SAPHENOUS VEIN;  Surgeon: [name], MD;  Location: [facility];  Service: Open Heart Surgery;  Laterality: Right;  . ESOPHAGEAL DILATION N/A 05/07/2015   Procedure: ESOPHAGEAL DILATION WITH 56FR MALONEY DILATOR;  Surgeon: [name], MD;  Location[facility];  Service: Endoscopy;  Laterality: N/A;  . ESOPHAGOGASTRODUODENOSCOPY (EGD) WITH PROPOFOL N/A 05/07/2015   RMR: Status post dilation of normal esophagus. Gastritis.  Marland Kitchen KNEE SURGERY Left    arthroscopy  . MANDIBLE FRACTURE SURGERY    . POLYPECTOMY  05/07/2015   Procedure: POLYPECTOMY (Hepatic Flexure, Distal Transverse Colon, Rectal);  Surgeon: [name], MD;  Location[facility];  Service: Endoscopy;;  . TEE WITHOUT CARDIOVERSION N/A 03/28/2016   Procedure: TRANSESOPHAGEAL ECHOCARDIOGRAM (TEE);  Surgeon: [name], MD;  Location: [facility];  Service: Open Heart Surgery;  Laterality: N/A;  . TONSILLECTOMY         Home Medications    Prior to Admission medications   Medication Sig Start Date End Date Taking? Authorizing Provider  ALPRAZolam Duanne Moron) 1 MG  tablet Take 1 mg by mouth 2 (two) times daily.   Yes Historical Provider, MD  amLODipine (NORVASC) 10 MG tablet Take 1 tablet (10 mg total) by mouth daily. 04/18/16  Yes Lendon Colonel, NP  atorvastatin (LIPITOR) 40 MG tablet Take 1 tablet (40 mg total) by mouth at bedtime. 04/18/16 07/17/16 Yes Lendon Colonel, NP  clopidogrel (PLAVIX) 75 MG tablet Take 1 tablet (75 mg total) by mouth daily. 04/18/16  Yes Lendon Colonel, NP  divalproex (DEPAKOTE) 500 MG DR tablet Take 3 tablets (1,500 mg total) by mouth 2 (two) times daily. 09/21/15  Yes Penni Bombard, MD  FLUoxetine (PROZAC) 40 MG capsule Take 40 mg by mouth daily.    Yes Historical Provider, MD  furosemide (LASIX) 40 MG tablet Take 1 tablet (40 mg total) by mouth daily. 04/11/16  Yes [name], MD  glucose blood (ACCU-CHEK AVIVA) test strip Use to test 4 times a day. E11.65 03/18/16  Yes Cassandria Anger, MD  HYDROcodone-acetaminophen (NORCO) 5-325 MG tablet Take 1 tablet by mouth every 6 (six) hours as needed for  severe pain. 05/03/16  Yes Milagros Loll, MD  insulin aspart (NOVOLOG) 100 UNIT/ML FlexPen Inject 5-11 Units into the skin 3 (three) times daily with meals. 03/11/16  Yes Cassandria Anger, MD  Insulin Glargine (LANTUS SOLOSTAR [place]) Inject 30 Units into the skin at bedtime.   Yes Historical Provider, MD  isosorbide mononitrate (IMDUR) 60 MG 24 hr tablet Take 90 mg by mouth daily.   Yes Historical Provider, MD  levETIRAcetam (KEPPRA) 500 MG tablet Take 500 mg by mouth 2 (two) times daily.   Yes Historical Provider, MD  linagliptin (TRADJENTA) 5 MG TABS tablet Take 5 mg by mouth daily.   Yes Historical Provider, MD  ranolazine (RANEXA) 500 MG 12 hr tablet Take 500 mg by mouth 2 (two) times daily.   Yes Historical Provider, MD  tamsulosin (FLOMAX) 0.4 MG CAPS capsule Take 1 capsule (0.4 mg total) by mouth daily. 04/03/16  Yes Donielle Liston Alba, PA-C  acetaminophen (TYLENOL) 325 MG tablet Take 2 tablets (650 mg total)  by mouth every 6 (six) hours as needed for mild pain. Patient not taking: Reported on 06/18/2016 04/03/16   Nani Skillern, PA-C  HYDROcodone-acetaminophen (NORCO/VICODIN) 5-325 MG tablet Take 1-2 tablets by mouth every 6 (six) hours as needed. 06/18/16   Fredia Sorrow, MD  Potassium Chloride ER 20 MEQ TBCR Take 20 mEq by mouth daily. Patient not taking: Reported on 06/18/2016 04/11/16 04/25/16  [name], MD    Family History Family History  Problem Relation Age of Onset  . Diabetes Father   . Hypertension Father   . Arthritis    . Diabetes    . Asthma    . Stroke Sister     Social History Social History  Substance Use Topics  . Smoking status: Former Smoker    Packs/day: 0.75    Years: 30.00    Types: Cigarettes    Start date: 03/28/2016  . Smokeless tobacco: Never Used  . Alcohol use No     Allergies   Aspirin and Other   Review of Systems Review of Systems  Constitutional: Positive for fever.  HENT: Negative for congestion, rhinorrhea and sore throat.   Eyes: Negative for visual disturbance.  Respiratory: Positive for shortness of breath. Negative for cough.   Cardiovascular: Positive for chest pain and leg swelling.  Gastrointestinal: Negative for abdominal pain, diarrhea, nausea and vomiting.  Genitourinary: Negative for dysuria and hematuria.  Musculoskeletal: Positive for back pain and myalgias. Negative for neck pain.  Skin: Negative for rash.  Neurological: Negative for headaches.  Hematological: Does not bruise/bleed easily.  Psychiatric/Behavioral: Negative for confusion.     Physical Exam Updated Vital Signs BP 135/94 (BP Location: Left Arm)   Pulse 62   Temp 98.2 F (36.8 C) (Oral)   Resp 15   Ht 5\' 7"  (1.702 m)   Wt 65.8 kg   SpO2 98%   BMI 22.71 kg/m   Physical Exam  Constitutional: He is oriented to person, place, and time. He appears well-developed and well-nourished.  HENT:  Head: Normocephalic.  Eyes: Conjunctivae are  normal.  Cardiovascular: Normal rate, regular rhythm and normal heart sounds.   Pulmonary/Chest: Effort normal and breath sounds normal. No respiratory distress. He has no wheezes. He has no rales.  Abdominal: Soft. Bowel sounds are normal. He exhibits no distension. There is no tenderness.  Musculoskeletal: Normal range of motion. He exhibits edema.  Well-healed mid sternal incision. Trace edema right leg. No swelling to left lower extremity.  No significant calf or knee swelling RLE. Well-healed graft incisions to right upper leg.   Neurological: He is alert and oriented to person, place, and time. No cranial nerve deficit or sensory deficit. He exhibits normal muscle tone. Coordination normal.  Skin: Skin is warm and dry.  Psychiatric: He has a normal mood and affect. His behavior is normal.  Nursing note and vitals reviewed.    ED Treatments / Results   DIAGNOSTIC STUDIES: Oxygen Saturation is 100% on RA, normal by my interpretation.    COORDINATION OF CARE: 8:46 AM Discussed treatment plan with pt at bedside which includes lab work, CXR and pt agreed to plan.    Labs (all labs ordered are listed, but only abnormal results are displayed) Labs Reviewed  BASIC METABOLIC PANEL - Abnormal; Notable for the following:       Result Value   Calcium 8.5 (*)    Anion gap 3 (*)    All other components within normal limits  CBC - Abnormal; Notable for the following:    RBC 4.12 (*)    Hemoglobin 12.7 (*)    HCT 36.1 (*)    All other components within normal limits  I-STAT TROPOININ, ED    EKG  EKG Interpretation  Date/Time:  Saturday June 18 2016 08:25:15 EST Ventricular Rate:  73 PR Interval:    QRS Duration: 75 QT Interval:  400 QTC Calculation: 441 R Axis:   -62 Text Interpretation:  Sinus rhythm Left anterior fascicular block Anterior infarct, old No significant change since last tracing Confirmed by Philander Ake  MD, [place] [id number]) on 06/18/2016 8:31:25 AM        Radiology Dg Chest 2 View  Result Date: 06/18/2016 CLINICAL DATA:  Chest pain, upper back pain. EXAM: CHEST  2 VIEW COMPARISON:  05/04/2016 FINDINGS: Prior CABG. Heart is borderline in size. Lungs are clear. No effusions. No acute bony abnormality. IMPRESSION: No active cardiopulmonary disease. Electronically Signed   By: Rolm Baptise M.D.   On: 06/18/2016 09:14    Procedures Procedures (including critical care time)  Medications Ordered in ED Medications  HYDROcodone-acetaminophen (NORCO/VICODIN) 5-325 MG per tablet 1 tablet (1 tablet Oral Given 06/18/16 0910)     Initial Impression / Assessment and Plan / ED Course  I have reviewed the triage vital signs and the nursing notes.  Pertinent labs & imaging results that were available during my care of the patient were reviewed by me and considered in my medical decision making (see chart for details).  Clinical Course     Patient status post five-vessel CABG August. Presents with left-sided chest pain. Troponin negative. In addition patient with the new vessels highly unlikely to have an acute cardiac event. EKG without acute changes. Chest x-ray negative. No comp cocaine factors. Suspect the postoperative chest wall pain. Will treat with hydrocodone. Him follow-up with cardiology has record Dr. Patient nontoxic no acute distress.  Final Clinical Impressions(s) / ED Diagnoses   Final diagnoses:  Chest pain, unspecified type    New Prescriptions New Prescriptions   HYDROCODONE-ACETAMINOPHEN (NORCO/VICODIN) 5-325 MG TABLET    Take 1-2 tablets by mouth every 6 (six) hours as needed.   I personally performed the services described in this documentation, which was scribed in my presence. The recorded information has been reviewed and is accurate.      Fredia Sorrow, MD 06/18/16 1021

## 2016-06-18 NOTE — ED Triage Notes (Signed)
Patient brought in via EMS from home. Airway patent. Alert and oriented. Patient c/o left sided chest pain that radiates into left arm. Per patient started this morning at 4am. Patient reports shortness of breath with pain. Patient has recent hx of CABG. Patient reports taking 2 SL nitro this morning and and other one given by EMS in route with no relief.

## 2016-06-18 NOTE — Discharge Instructions (Signed)
Workup here today without any acute findings. Your cardiac heart marker was normal. Chest x-ray negative. Take pain medicine as directed. Make an appointment to follow-up with your cardiologist and primary care doctor. Return for any new or worse symptoms.

## 2016-07-05 ENCOUNTER — Ambulatory Visit: Payer: Medicaid Other | Admitting: Cardiovascular Disease

## 2016-07-05 ENCOUNTER — Encounter: Payer: Self-pay | Admitting: Cardiovascular Disease

## 2016-07-05 ENCOUNTER — Encounter (HOSPITAL_COMMUNITY): Admission: RE | Admit: 2016-07-05 | Payer: Medicaid Other | Source: Ambulatory Visit

## 2016-07-11 ENCOUNTER — Ambulatory Visit: Payer: Medicaid Other | Admitting: "Endocrinology

## 2016-08-04 ENCOUNTER — Encounter: Payer: Self-pay | Admitting: *Deleted

## 2016-08-05 ENCOUNTER — Ambulatory Visit (INDEPENDENT_AMBULATORY_CARE_PROVIDER_SITE_OTHER): Payer: Medicaid Other | Admitting: Cardiovascular Disease

## 2016-08-05 ENCOUNTER — Encounter: Payer: Self-pay | Admitting: Cardiovascular Disease

## 2016-08-05 VITALS — BP 132/85 | HR 70 | Ht 67.0 in | Wt 150.0 lb

## 2016-08-05 DIAGNOSIS — Z951 Presence of aortocoronary bypass graft: Secondary | ICD-10-CM

## 2016-08-05 DIAGNOSIS — I10 Essential (primary) hypertension: Secondary | ICD-10-CM

## 2016-08-05 DIAGNOSIS — R600 Localized edema: Secondary | ICD-10-CM | POA: Diagnosis not present

## 2016-08-05 DIAGNOSIS — E785 Hyperlipidemia, unspecified: Secondary | ICD-10-CM

## 2016-08-05 MED ORDER — METOPROLOL TARTRATE 25 MG PO TABS: 25.0000 mg | ORAL_TABLET | Freq: Two times a day (BID) | ORAL | 3 refills | Status: DC

## 2016-08-05 NOTE — Progress Notes (Signed)
SUBJECTIVE: The patient presents for follow-up of coronary artery disease. He underwent 5 vessel CABG on 03/28/16. Cold weather exacerbates chest pain. He recently took nitroglycerin with relief. He feels better taking nitrates and Ranexa even after bypass surgery. Still has right leg pain and numbness at site of vein harvesting.    Review of Systems: As per "subjective", otherwise negative.  Allergies  Allergen Reactions  . Aspirin Swelling    SWELLING REACTION UNSPECIFIED   . Other Other (See Comments)    NO BLOOD PRODUCTS per patient's wishes    Current Outpatient Prescriptions  Medication Sig Dispense Refill  . acetaminophen (TYLENOL) 325 MG tablet Take 2 tablets (650 mg total) by mouth every 6 (six) hours as needed for mild pain.    Marland Kitchen ALPRAZolam (XANAX) 1 MG tablet Take 1 mg by mouth 2 (two) times daily.    Marland Kitchen amLODipine (NORVASC) 10 MG tablet Take 1 tablet (10 mg total) by mouth daily. 90 tablet 2  . atorvastatin (LIPITOR) 40 MG tablet Take 40 mg by mouth daily.    . clopidogrel (PLAVIX) 75 MG tablet Take 1 tablet (75 mg total) by mouth daily. 90 tablet 2  . divalproex (DEPAKOTE) 500 MG DR tablet Take 3 tablets (1,500 mg total) by mouth 2 (two) times daily. 540 tablet 4  . FLUoxetine (PROZAC) 40 MG capsule Take 40 mg by mouth daily.     . furosemide (LASIX) 40 MG tablet Take 1 tablet (40 mg total) by mouth daily. 14 tablet 0  . glucose blood (ACCU-CHEK AVIVA) test strip Use to test 4 times a day. E11.65 150 each 5  . HYDROcodone-acetaminophen (NORCO) 5-325 MG tablet Take 1 tablet by mouth every 6 (six) hours as needed for severe pain. 8 tablet 0  . HYDROcodone-acetaminophen (NORCO/VICODIN) 5-325 MG tablet Take 1-2 tablets by mouth every 6 (six) hours as needed. 20 tablet 0  . insulin aspart (NOVOLOG) 100 UNIT/ML FlexPen Inject 5-11 Units into the skin 3 (three) times daily with meals. 5 pen 2  . Insulin Glargine (LANTUS SOLOSTAR [employer] [employer]) Inject 30 Units into the skin at  bedtime.    . isosorbide mononitrate (IMDUR) 60 MG 24 hr tablet Take 90 mg by mouth daily.    Marland Kitchen levETIRAcetam (KEPPRA) 500 MG tablet Take 500 mg by mouth 2 (two) times daily.    Marland Kitchen linagliptin (TRADJENTA) 5 MG TABS tablet Take 5 mg by mouth daily.    . ranolazine (RANEXA) 500 MG 12 hr tablet Take 500 mg by mouth 2 (two) times daily.    . tamsulosin (FLOMAX) 0.4 MG CAPS capsule Take 1 capsule (0.4 mg total) by mouth daily. 30 capsule 1  . Potassium Chloride ER 20 MEQ TBCR Take 20 mEq by mouth daily. (Patient not taking: Reported on 06/18/2016) 14 tablet 0   No current facility-administered medications for this visit.     Past Medical History:  Diagnosis Date  . Anxiety   . Bipolar 1 disorder ([place])   . Chest pain   . Chronic back pain   . Coronary artery disease   . Depression   . Diabetes mellitus    Type II  . Hallucinations    "long history of them"  . Headache(784.0)   . History of gout   . HTN (hypertension)   . Insomnia   . Neuropathy ([place])   . Pain management   . Schizophrenia ([place])   . Seizures ([place])    last sz between July 5-9th, 2016;  epilepsy  . Shortness of breath dyspnea    with exertion  . Sleep apnea   . Transfusion of blood product refused for religious reason     Past Surgical History:  Procedure Laterality Date  . ANTERIOR CERVICAL DECOMP/DISCECTOMY FUSION N/A 12/14/2015   Procedure: ANTERIOR CERVICAL DECOMPRESSION/DISCECTOMY FUSION CERVICAL FIVE -SIX;  Surgeon: Earnie Larsson, MD;  Location: [facility];  Service: Neurosurgery;  Laterality: N/A;  . BIOPSY  05/07/2015   Procedure: BIOPSY (Gastric);  Surgeon: [name], MD;  [facility];  Service: Endoscopy;;  . CARDIAC CATHETERIZATION N/A 03/07/2016   Procedure: Left Heart Cath and Coronary Angiography;  Surgeon: Belva Crome, MD;  Location: [facility];  Service: Cardiovascular;  Laterality: N/A;  . COLONOSCOPY WITH PROPOFOL N/A 05/07/2015   [id number] diverticulosis, multiple colon polyps  removed, tubular adenoma, serrated colon polyp. Next colonoscopy October 2019  . CORONARY ARTERY BYPASS GRAFT N/A 03/28/2016   Procedure: CORONARY ARTERY BYPASS GRAFTING (CABG) x 5 USING GREATER SAPHENOUS VEIN;  Surgeon: [name], MD;  Location: [facility];  Service: Open Heart Surgery;  Laterality: N/A;  . ENDOVEIN HARVEST OF GREATER SAPHENOUS VEIN Right 03/28/2016   Procedure: ENDOVEIN HARVEST OF GREATER SAPHENOUS VEIN;  Surgeon: [name], MD;  Location: [facility];  Service: Open Heart Surgery;  Laterality: Right;  . ESOPHAGEAL DILATION N/A 05/07/2015   Procedure: ESOPHAGEAL DILATION WITH 56FR MALONEY DILATOR;  Surgeon: [name], MD;  [facility];  Service: Endoscopy;  Laterality: N/A;  . ESOPHAGOGASTRODUODENOSCOPY (EGD) WITH PROPOFOL N/A 05/07/2015   RMR: Status post dilation of normal esophagus. Gastritis.  Marland Kitchen KNEE SURGERY Left    arthroscopy  . MANDIBLE FRACTURE SURGERY    . POLYPECTOMY  05/07/2015   Procedure: POLYPECTOMY (Hepatic Flexure, Distal Transverse Colon, Rectal);  Surgeon: [name], MD;  [facility];  Service: Endoscopy;;  . TEE WITHOUT CARDIOVERSION N/A 03/28/2016   Procedure: TRANSESOPHAGEAL ECHOCARDIOGRAM (TEE);  Surgeon: [name], MD;  Location: [facility];  Service: Open Heart Surgery;  Laterality: N/A;  . TONSILLECTOMY      Social History   Social History  . Marital status: Single    Spouse name: N/A  . Number of children: N/A  . Years of education: 11th grade   Occupational History  . unemployed Not Employed   Social History Main Topics  . Smoking status: Current Every Day Smoker    Packs/day: 0.75    Years: 30.00    Types: Cigarettes    Start date: 03/28/2016  . Smokeless tobacco: Never Used  . Alcohol use No  . Drug use: No  . Sexual activity: Not Currently   Other Topics Concern  . Not on file   Social History Narrative   Lives alone   Drinks a cup of coffee a day     Vitals:   08/05/16 1118  BP: 132/85  Pulse: 70    Weight: 150 lb (68 kg)  Height: 5\' 7"  (1.702 m)    PHYSICAL EXAM General: NAD HEENT: Normal. Neck: No JVD, no thyromegaly. Lungs: Clear to auscultation bilaterally with normal respiratory effort. CV: Nondisplaced PMI.  Regular rate and rhythm, normal S1/S2, no S3/S4, no murmur. No pretibial or periankle edema.  Abdomen: Soft, nontender, no distention.  Neurologic: Alert and oriented.  Psych: Normal affect. Skin: Normal. Musculoskeletal: No gross deformities.    ECG: Most recent ECG reviewed.      ASSESSMENT AND PLAN: 1. CAD s/p 5-v CABG 03/2016:  Continue statin, Plavix, Ranexa, and nitrates. Will add metoprolol 25 mg twice daily.  2. Hypertension: Controlled. Monitor given addition of metoprolol.  3. Hyperlipidemia: Continue statin.  Dispo: fu 6 months   Kate Sable, M.D., F.A.C.C.

## 2016-08-05 NOTE — Patient Instructions (Signed)
Your physician wants you to follow-up in: [place]. [place] You will receive a reminder letter in the mail two months in advance. If you don't receive a letter, please call our office to schedule the follow-up appointment.  Your physician has recommended you make the following change in your medication:   START METOPROLOL 25 MG TWICE DAILY  Thank you for choosing [facility]!!

## 2016-09-03 ENCOUNTER — Encounter (HOSPITAL_COMMUNITY): Payer: Self-pay | Admitting: Emergency Medicine

## 2016-09-03 ENCOUNTER — Emergency Department (HOSPITAL_COMMUNITY)
Admission: EM | Admit: 2016-09-03 | Discharge: 2016-09-03 | Discharge status: Against Medical Advice | Payer: Medicaid Other | Attending: Emergency Medicine | Admitting: Emergency Medicine

## 2016-09-03 ENCOUNTER — Emergency Department (HOSPITAL_COMMUNITY): Payer: Medicaid Other

## 2016-09-03 DIAGNOSIS — I10 Essential (primary) hypertension: Secondary | ICD-10-CM | POA: Diagnosis not present

## 2016-09-03 DIAGNOSIS — Z7289 Other problems related to lifestyle: Secondary | ICD-10-CM | POA: Insufficient documentation

## 2016-09-03 DIAGNOSIS — F17210 Nicotine dependence, cigarettes, uncomplicated: Secondary | ICD-10-CM | POA: Diagnosis not present

## 2016-09-03 DIAGNOSIS — E119 Type 2 diabetes mellitus without complications: Secondary | ICD-10-CM | POA: Diagnosis not present

## 2016-09-03 DIAGNOSIS — M79661 Pain in right lower leg: Secondary | ICD-10-CM | POA: Diagnosis not present

## 2016-09-03 DIAGNOSIS — Z79899 Other long term (current) drug therapy: Secondary | ICD-10-CM | POA: Insufficient documentation

## 2016-09-03 DIAGNOSIS — G8929 Other chronic pain: Secondary | ICD-10-CM | POA: Diagnosis not present

## 2016-09-03 DIAGNOSIS — R0789 Other chest pain: Secondary | ICD-10-CM | POA: Insufficient documentation

## 2016-09-03 DIAGNOSIS — I2510 Atherosclerotic heart disease of native coronary artery without angina pectoris: Secondary | ICD-10-CM | POA: Insufficient documentation

## 2016-09-03 DIAGNOSIS — Z794 Long term (current) use of insulin: Secondary | ICD-10-CM | POA: Diagnosis not present

## 2016-09-03 DIAGNOSIS — M79604 Pain in right leg: Secondary | ICD-10-CM

## 2016-09-03 DIAGNOSIS — Z765 Malingerer [conscious simulation]: Secondary | ICD-10-CM

## 2016-09-03 HISTORY — DX: Other chronic pain: G89.29

## 2016-09-03 HISTORY — DX: Pain in right leg: M79.604

## 2016-09-03 HISTORY — DX: Other chest pain: R07.89

## 2016-09-03 LAB — CBC
HCT: 38.2 % — ABNORMAL LOW (ref 39.0–52.0)
Hemoglobin: 13.7 g/dL (ref 13.0–17.0)
MCH: 31.6 pg (ref 26.0–34.0)
MCHC: 35.9 g/dL (ref 30.0–36.0)
MCV: 88.2 fL (ref 78.0–100.0)
PLATELETS: 189 10*3/uL (ref 150–400)
RBC: 4.33 MIL/uL (ref 4.22–5.81)
RDW: 13 % (ref 11.5–15.5)
WBC: 5.4 10*3/uL (ref 4.0–10.5)

## 2016-09-03 LAB — BASIC METABOLIC PANEL
Anion gap: 5 (ref 5–15)
BUN: 13 mg/dL (ref 6–20)
CO2: 30 mmol/L (ref 22–32)
CREATININE: 1.18 mg/dL (ref 0.61–1.24)
Calcium: 9.1 mg/dL (ref 8.9–10.3)
Chloride: 104 mmol/L (ref 101–111)
GFR calc Af Amer: >60 mL/min (ref >60)
Glucose, Bld: 102 mg/dL — ABNORMAL HIGH (ref 65–99)
Potassium: 3.9 mmol/L (ref 3.5–5.1)
SODIUM: 139 mmol/L (ref 135–145)

## 2016-09-03 LAB — I-STAT TROPONIN, ED: Troponin i, poc: 0 ng/mL (ref 0.00–0.08)

## 2016-09-03 LAB — D-DIMER, QUANTITATIVE (NOT AT ARMC): D DIMER QUANT: 0.35 ug{FEU}/mL (ref 0.00–0.50)

## 2016-09-03 MED ORDER — OXYCODONE-ACETAMINOPHEN 5-325 MG PO TABS
1.0000 | ORAL_TABLET | Freq: Once | ORAL | Status: AC
  Administered 2016-09-03: 1 via ORAL
  Filled 2016-09-03: qty 1

## 2016-09-03 NOTE — ED Provider Notes (Signed)
[facility] DEPT Provider Note   CSN: [id number] Arrival date & time: 09/03/16  1152     History   Chief Complaint Chief Complaint  Patient presents with  . Chest Pain    HPI Melvin Taylor is a 51 y.o. male.   Chest Pain      Pt was seen at 1210. Per pt, c/o gradual onset and persistence of constant acute flair of his chronic chest wall and RLE "pains" for the past 2 weeks. Pt states the pain began after he ran out of his pain meds 2 weeks ago. Describes the CP as "sharp" and constant, worsens with movement of his left arm and palpation of the area. Pt describes his chest and RLE pain as per his usual chronic pain pattern. Has been associated with SOB. Denies new pain, no palpitations, no cough, no abd pain, no N/V/D, no back pain, no fevers, no rash.   Past Medical History:  Diagnosis Date  . Anxiety   . Bipolar 1 disorder ([place])   . Chest pain   . Chronic back pain   . Chronic chest wall pain   . Coronary artery disease   . Depression   . Diabetes mellitus    Type II  . Hallucinations    "long history of them"  . Headache(784.0)   . History of gout   . HTN (hypertension)   . Insomnia   . Neuropathy ([place])   . Pain management   . Right leg pain    chronic  . Schizophrenia ([place])   . Seizures ([place])    last sz between July 5-9th, 2016; epilepsy  . Shortness of breath dyspnea    with exertion  . Sleep apnea   . Transfusion of blood product refused for religious reason     Patient Active Problem List   Diagnosis Date Noted  . CAD (coronary artery disease) 03/28/2016  . Chest pain 03/21/2016  . Hyperlipidemia 03/11/2016  . Ischemic chest pain ([place])   . NSTEMI (non-ST elevated myocardial infarction) ([place])   . Coronary atherosclerosis of native coronary artery 03/03/2016  . Cervical spinal stenosis 12/14/2015  . Loss of weight 08/25/2015  . Essential hypertension, benign 07/23/2015  . Personal history of noncompliance with medical treatment, presenting  hazards to health 07/23/2015  . Abdominal pain 06/08/2015  . Constipation 06/08/2015  . History of colonic polyps   . Diverticulosis of colon without hemorrhage   . Mucosal abnormality of stomach   . Encounter for screening colonoscopy 04/16/2015  . Dysphagia 04/16/2015  . Partial symptomatic epilepsy with complex partial seizures, intractable, without status epilepticus ([place]) 11/10/2014  . DM type 2 causing vascular disease ([place]) 06/29/2014  . Musculoskeletal pain 06/29/2014  . Hyperglycemia without ketosis 09/07/2013  . 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 Knee pain 12/28/2010  . Medial meniscus, posterior horn derangement 12/28/2010    Past Surgical History:  Procedure Laterality Date  . ANTERIOR CERVICAL DECOMP/DISCECTOMY FUSION N/A 12/14/2015   Procedure: ANTERIOR CERVICAL DECOMPRESSION/DISCECTOMY FUSION CERVICAL FIVE -SIX;  Surgeon: Earnie Larsson, MD;  Location: [facility];  Service: Neurosurgery;  Laterality: N/A;  . BIOPSY  05/07/2015   Procedure: BIOPSY (Gastric);  Surgeon: [name], MD;  Location: [facility];  Service: Endoscopy;;  . CARDIAC CATHETERIZATION N/A 03/07/2016   Procedure: Left Heart Cath and Coronary Angiography;  Surgeon: Belva Crome, MD;  Location: [facility];  Service: Cardiovascular;  Laterality: N/A;  . COLONOSCOPY WITH PROPOFOL N/A 05/07/2015  [id number] diverticulosis, multiple colon polyps removed, tubular adenoma, serrated colon polyp. Next colonoscopy October 2019  . CORONARY ARTERY BYPASS GRAFT N/A 03/28/2016   Procedure: CORONARY ARTERY BYPASS GRAFTING (CABG) x 5 USING GREATER SAPHENOUS VEIN;  Surgeon: [name], MD;  Location: [facility];  Service: Open Heart Surgery;  Laterality: N/A;  . ENDOVEIN HARVEST OF GREATER SAPHENOUS VEIN Right 03/28/2016   Procedure: ENDOVEIN HARVEST OF GREATER SAPHENOUS VEIN;  Surgeon: [name], MD;  Location: [facility];  Service: Open Heart Surgery;  Laterality: Right;  . ESOPHAGEAL DILATION  N/A 05/07/2015   Procedure: ESOPHAGEAL DILATION WITH 56FR MALONEY DILATOR;  Surgeon: [name], MD;  Location: [facility];  Service: Endoscopy;  Laterality: N/A;  . ESOPHAGOGASTRODUODENOSCOPY (EGD) WITH PROPOFOL N/A 05/07/2015   RMR: Status post dilation of normal esophagus. Gastritis.  Marland Kitchen KNEE SURGERY Left    arthroscopy  . MANDIBLE FRACTURE SURGERY    . POLYPECTOMY  05/07/2015   Procedure: POLYPECTOMY (Hepatic Flexure, Distal Transverse Colon, Rectal);  Surgeon: [name], MD;  Location: [facility];  Service: Endoscopy;;  . TEE WITHOUT CARDIOVERSION N/A 03/28/2016   Procedure: TRANSESOPHAGEAL ECHOCARDIOGRAM (TEE);  Surgeon: [name], MD;  Location: [facility];  Service: Open Heart Surgery;  Laterality: N/A;  . TONSILLECTOMY         Home Medications    Prior to Admission medications   Medication Sig Start Date End Date Taking? Authorizing Provider  acetaminophen (TYLENOL) 325 MG tablet Take 2 tablets (650 mg total) by mouth every 6 (six) hours as needed for mild pain. 04/03/16   Donielle Liston Alba, PA-C  ALPRAZolam Duanne Moron) 1 MG tablet Take 1 mg by mouth 2 (two) times daily.    Historical Provider, MD  amLODipine (NORVASC) 10 MG tablet Take 1 tablet (10 mg total) by mouth daily. 04/18/16   Lendon Colonel, NP  atorvastatin (LIPITOR) 40 MG tablet Take 40 mg by mouth daily.    Historical Provider, MD  clopidogrel (PLAVIX) 75 MG tablet Take 1 tablet (75 mg total) by mouth daily. 04/18/16   Lendon Colonel, NP  divalproex (DEPAKOTE) 500 MG DR tablet Take 3 tablets (1,500 mg total) by mouth 2 (two) times daily. 09/21/15   Penni Bombard, MD  FLUoxetine (PROZAC) 40 MG capsule Take 40 mg by mouth daily.     Historical Provider, MD  furosemide (LASIX) 40 MG tablet Take 1 tablet (40 mg total) by mouth daily. 04/11/16   [name], MD  glucose blood (ACCU-CHEK AVIVA) test strip Use to test 4 times a day. E11.65 03/18/16   Cassandria Anger, MD  HYDROcodone-acetaminophen (NORCO) 5-325 MG  tablet Take 1 tablet by mouth every 6 (six) hours as needed for severe pain. 05/03/16   Milagros Loll, MD  HYDROcodone-acetaminophen (NORCO/VICODIN) 5-325 MG tablet Take 1-2 tablets by mouth every 6 (six) hours as needed. 06/18/16   Fredia Sorrow, MD  insulin aspart (NOVOLOG) 100 UNIT/ML FlexPen Inject 5-11 Units into the skin 3 (three) times daily with meals. 03/11/16   Cassandria Anger, MD  Insulin Glargine (LANTUS SOLOSTAR [place]) Inject 30 Units into the skin at bedtime.    Historical Provider, MD  isosorbide mononitrate (IMDUR) 60 MG 24 hr tablet Take 90 mg by mouth daily.    Historical Provider, MD  levETIRAcetam (KEPPRA) 500 MG tablet Take 500 mg by mouth 2 (two) times daily.    Historical Provider, MD  linagliptin (TRADJENTA) 5 MG TABS tablet Take 5 mg by  mouth daily.    Historical Provider, MD  metoprolol tartrate (LOPRESSOR) 25 MG tablet Take 1 tablet (25 mg total) by mouth 2 (two) times daily. 08/05/16 11/03/16  Herminio Commons, MD  Potassium Chloride ER 20 MEQ TBCR Take 20 mEq by mouth daily. Patient not taking: Reported on 06/18/2016 04/11/16 04/25/16  [name], MD  ranolazine (RANEXA) 500 MG 12 hr tablet Take 500 mg by mouth 2 (two) times daily.    Historical Provider, MD  tamsulosin (FLOMAX) 0.4 MG CAPS capsule Take 1 capsule (0.4 mg total) by mouth daily. 04/03/16   Donielle Liston Alba, PA-C    Family History Family History  Problem Relation Age of Onset  . Diabetes Father   . Hypertension Father   . Arthritis    . Diabetes    . Asthma    . Stroke Sister     Social History Social History  Substance Use Topics  . Smoking status: Current Every Day Smoker    Packs/day: 0.75    Years: 30.00    Types: Cigarettes    Start date: 03/28/2016  . Smokeless tobacco: Never Used  . Alcohol use No     Allergies   Aspirin and Other   Review of Systems Review of Systems  Cardiovascular: Positive for chest pain.  ROS: Statement: All systems negative except as marked or  noted in the HPI; Constitutional: Negative for fever and chills. ; ; Eyes: Negative for eye pain, redness and discharge. ; ; ENMT: Negative for ear pain, hoarseness, nasal congestion, sinus pressure and sore throat. ; ; Cardiovascular: Negative for palpitations, diaphoresis, and peripheral edema. ; ; Respiratory: +SOB. Negative for cough, wheezing and stridor. ; ; Gastrointestinal: Negative for nausea, vomiting, diarrhea, abdominal pain, blood in stool, hematemesis, jaundice and rectal bleeding. . ; ; Genitourinary: Negative for dysuria, flank pain and hematuria. ; ; Musculoskeletal: +chronic chest wall pain, chronic RLE pain. Negative for back pain and neck pain. Negative for swelling and trauma.; ; Skin: Negative for pruritus, rash, abrasions, blisters, bruising and skin lesion.; ; Neuro: Negative for headache, lightheadedness and neck stiffness. Negative for weakness, altered level of consciousness, altered mental status, extremity weakness, paresthesias, involuntary movement, seizure and syncope.      Physical Exam Updated Vital Signs BP 151/99 (BP Location: Left Arm)   Pulse (!) 56   Temp 97.6 F (36.4 C) (Oral)   Resp 14   Ht 5\' 6"  (1.676 m)   Wt 148 lb (67.1 kg)   SpO2 100%   BMI 23.89 kg/m   12:43:08 Orthostatic Vital Signs CB  Orthostatic Lying   BP- Lying: 144/90  Pulse- Lying: 57      Orthostatic Sitting  BP- Sitting:  132/93  Pulse- Sitting: 64      Orthostatic Standing at 0 minutes  BP- Standing at 0 minutes: 148/90  Pulse- Standing at 0 minutes: 75     Physical Exam 1215: Physical examination:  Nursing notes reviewed; Vital signs and O2 SAT reviewed;  Constitutional: Well developed, Well nourished, Well hydrated, In no acute distress; Head:  Normocephalic, atraumatic; Eyes: EOMI, PERRL, No scleral icterus; ENMT: Mouth and pharynx normal, Mucous membranes moist; Neck: Supple, Full range of motion, No lymphadenopathy; Cardiovascular: Regular rate and rhythm, No gallop;  Respiratory: Breath sounds clear & equal bilaterally, No wheezes.  Speaking full sentences with ease, Normal respiratory effort/excursion; Chest: +bilat paraternal areas and left upper chest wall tender to palp. No deformity, no soft tissue crepitus, no rash. Movement normal;  Abdomen: Soft, Nontender, Nondistended, Normal bowel sounds; Genitourinary: No CVA tenderness; Extremities: Pulses normal, No tenderness, No edema, No calf edema or asymmetry.; Neuro: AA&Ox3, Major CN grossly intact.  Speech clear. No gross focal motor or sensory deficits in extremities.; Skin: Color normal, Warm, Dry.   ED Treatments / Results  Labs (all labs ordered are listed, but only abnormal results are displayed)   EKG  EKG Interpretation None       Radiology   Procedures Procedures (including critical care time)  Medications Ordered in ED Medications  oxyCODONE-acetaminophen (PERCOCET/ROXICET) 5-325 MG per tablet 1 tablet (not administered)     Initial Impression / Assessment and Plan / ED Course  I have reviewed the triage vital signs and the nursing notes.  Pertinent labs & imaging results that were available during my care of the patient were reviewed by me and considered in my medical decision making (see chart for details).  MDM Reviewed: previous chart, nursing note and vitals Reviewed previous: labs and ECG Interpretation: labs, ECG and x-ray   Results for orders placed or performed during the hospital encounter of [id number]  Basic metabolic panel  Result Value Ref Range   Sodium 139 135 - 145 mmol/L   Potassium 3.9 3.5 - 5.1 mmol/L   Chloride 104 101 - 111 mmol/L   CO2 30 22 - 32 mmol/L   Glucose, Bld 102 (H) 65 - 99 mg/dL   BUN 13 6 - 20 mg/dL   Creatinine, Ser 1.18 0.61 - 1.24 mg/dL   Calcium 9.1 8.9 - 10.3 mg/dL   GFR calc non Af Amer >60 >60 mL/min   GFR calc Af Amer >60 >60 mL/min   Anion gap 5 5 - 15  CBC  Result Value Ref Range   WBC 5.4 4.0 - 10.5 K/uL   RBC 4.33 4.22  - 5.81 MIL/uL   Hemoglobin 13.7 13.0 - 17.0 g/dL   HCT 38.2 (L) 39.0 - 52.0 %   MCV 88.2 78.0 - 100.0 fL   MCH 31.6 26.0 - 34.0 pg   MCHC 35.9 30.0 - 36.0 g/dL   RDW 13.0 11.5 - 15.5 %   Platelets 189 150 - 400 K/uL  D-dimer, quantitative  Result Value Ref Range   D-Dimer, Quant 0.35 0.00 - 0.50 ug/mL-FEU  I-stat troponin, ED  Result Value Ref Range   Troponin i, poc 0.00 0.00 - 0.08 ng/mL   Comment 3           Dg Chest 2 View Result Date: 09/03/2016 CLINICAL DATA:  Shortness of breath and dizziness.  Chest pain. EXAM: CHEST  2 VIEW COMPARISON:  June 18, 2016 FINDINGS: The heart size and mediastinal contours are within normal limits. Both lungs are clear. The visualized skeletal structures are unremarkable. IMPRESSION: No active cardiopulmonary disease. Electronically Signed   By: Dorise Bullion III M.D   On: 09/03/2016 13:26    [id number]:  Pt requested pain meds "by my IV because they'll get in my system faster." Aware he will receive PO pain meds, but will not receive rx for narcotic pain meds. Pt refuses to stay for delta trop and wants to leave now. Concern for drug seeking behavior. Doubt PE as cause for symptoms with negative d-dimer and low risk [place]. Pt informed re: dx testing results and, given his Hx, need for delta troponin for further evaluation.  Pt refuses.  ED RN and I encouraged pt to stay, continues to refuse.  Pt makes his own medical decisions.  Risks of AMA explained to pt, including, but not limited to:  stroke, heart attack, cardiac arrythmia ("irregular heart rate/beat"), "passing out," temporary and/or permanent disability, death.  Pt verb understanding and continues to refuse to stay for 2nd troponin, understanding the consequences of his decision.  I encouraged pt to follow up with his Cards MD and Pain Management MD on Monday and return to the ED immediately for any other concerns.  Pt verb understanding, agreeable.    Final Clinical Impressions(s) / ED Diagnoses    Final diagnoses:  None    New Prescriptions New Prescriptions   No medications on file      Francine Graven, DO 09/07/16 [id number]

## 2016-09-03 NOTE — ED Triage Notes (Signed)
Pt reports left chest pain radiating to left arm x1 week with sob and dizziness.  Pt had cabg in September and has right leg pain as a result of taking vein from right leg.  Pt alert and oriented.

## 2016-09-22 ENCOUNTER — Telehealth (HOSPITAL_COMMUNITY): Payer: Self-pay | Admitting: *Deleted

## 2016-09-22 NOTE — Telephone Encounter (Signed)
Ref from [facility] was faxed to office to sch new pt appt. Per provider notes, due to pt currently being in an ACTT team and this office not having any services for that, she suggest [name] inform ref pt back to [facility] to continue her treatment. Office staff was unable to reach [employer] (ref coordinator) and lmtcb and office number and pt name and DOB was left on voicemail.

## 2016-10-08 ENCOUNTER — Emergency Department (HOSPITAL_COMMUNITY): Payer: Medicaid Other

## 2016-10-08 ENCOUNTER — Emergency Department (HOSPITAL_COMMUNITY)
Admission: EM | Admit: 2016-10-08 | Discharge: 2016-10-08 | Disposition: A | Discharge status: Home / Self Care | Payer: Medicaid Other | Attending: Emergency Medicine | Admitting: Emergency Medicine

## 2016-10-08 ENCOUNTER — Encounter (HOSPITAL_COMMUNITY): Payer: Self-pay

## 2016-10-08 DIAGNOSIS — R079 Chest pain, unspecified: Secondary | ICD-10-CM | POA: Diagnosis not present

## 2016-10-08 DIAGNOSIS — F17210 Nicotine dependence, cigarettes, uncomplicated: Secondary | ICD-10-CM | POA: Diagnosis not present

## 2016-10-08 DIAGNOSIS — Z794 Long term (current) use of insulin: Secondary | ICD-10-CM | POA: Insufficient documentation

## 2016-10-08 DIAGNOSIS — E1159 Type 2 diabetes mellitus with other circulatory complications: Secondary | ICD-10-CM | POA: Insufficient documentation

## 2016-10-08 DIAGNOSIS — E1140 Type 2 diabetes mellitus with diabetic neuropathy, unspecified: Secondary | ICD-10-CM | POA: Insufficient documentation

## 2016-10-08 DIAGNOSIS — Z79899 Other long term (current) drug therapy: Secondary | ICD-10-CM | POA: Diagnosis not present

## 2016-10-08 DIAGNOSIS — I252 Old myocardial infarction: Secondary | ICD-10-CM | POA: Diagnosis not present

## 2016-10-08 DIAGNOSIS — Z951 Presence of aortocoronary bypass graft: Secondary | ICD-10-CM | POA: Insufficient documentation

## 2016-10-08 DIAGNOSIS — I2510 Atherosclerotic heart disease of native coronary artery without angina pectoris: Secondary | ICD-10-CM | POA: Insufficient documentation

## 2016-10-08 LAB — BASIC METABOLIC PANEL
Anion gap: 8 (ref 5–15)
BUN: 14 mg/dL (ref 6–20)
CALCIUM: 8.9 mg/dL (ref 8.9–10.3)
CO2: 25 mmol/L (ref 22–32)
CREATININE: 1.15 mg/dL (ref 0.61–1.24)
Chloride: 106 mmol/L (ref 101–111)
GFR calc Af Amer: >60 mL/min (ref >60)
GLUCOSE: 100 mg/dL — AB (ref 65–99)
Potassium: 3.5 mmol/L (ref 3.5–5.1)
SODIUM: 139 mmol/L (ref 135–145)

## 2016-10-08 LAB — CBC
HCT: 37.6 % — ABNORMAL LOW (ref 39.0–52.0)
Hemoglobin: 12.9 g/dL — ABNORMAL LOW (ref 13.0–17.0)
MCH: 30.8 pg (ref 26.0–34.0)
MCHC: 34.3 g/dL (ref 30.0–36.0)
MCV: 89.7 fL (ref 78.0–100.0)
PLATELETS: 165 10*3/uL (ref 150–400)
RBC: 4.19 MIL/uL — ABNORMAL LOW (ref 4.22–5.81)
RDW: 12.9 % (ref 11.5–15.5)
WBC: 4.4 10*3/uL (ref 4.0–10.5)

## 2016-10-08 LAB — I-STAT TROPONIN, ED
TROPONIN I, POC: 0 ng/mL (ref 0.00–0.08)
Troponin i, poc: 0 ng/mL (ref 0.00–0.08)

## 2016-10-08 MED ORDER — LIDOCAINE 5 % EX PTCH: 1.0000 | MEDICATED_PATCH | CUTANEOUS | 0 refills | Status: DC

## 2016-10-08 MED ORDER — LIDOCAINE 5 % EX PTCH
1.0000 | MEDICATED_PATCH | CUTANEOUS | Status: DC
  Administered 2016-10-08: 1 via TRANSDERMAL
  Filled 2016-10-08: qty 1

## 2016-10-08 NOTE — ED Notes (Signed)
Pt in radiology 

## 2016-10-08 NOTE — ED Provider Notes (Signed)
[facility] DEPT Provider Note   CSN: [id number] Arrival date & time: 10/08/16  1149     History   Chief Complaint Chief Complaint  Patient presents with  . Chest Pain    HPI Melvin Taylor is a 51 y.o. male.  HPI 52 year old African-American male with a past medical history significant for chronic chest wall pain, CAD status post bypass, NSTEMI,That presents to the ED today with complaints of left-sided chest pain. Patient states the pain started approximately 8 AM this morning. States the left side of his chest and radiated to his left arm. Reports associated diaphoresis, shortness of breath with the episode. Was not pleuritic in nature. It was nonexertional. Staets that when it is cold outside the pain gets worse. States the pain has been constant since he called EMS to transport him to the ED today. Movement makes the pain worse. Patient also reports associated numbness to the area of his bypass graft of his right leg. This been ongoing since the surgery. Has been seen multiple times for same. Patient also states that he is on chronic pain medicine. They have him on 5 Percocet but is a hospital he was taken 10 Percocet and this pain medicine is not doing anything for his pain. Patient was just seen on 09/03/2016 for chest pain. Negative troponin. He left AMA because he refused to wait for the second troponin after not getting IV pain medication. Pain is worse with movement and palpation. Denies any cough. Denies any shortness of breath this time. Denies any congestion, rhinorrhea, sore throat, visual disturbances, headache, abdominal pain, urinary symptoms, nausea, emesis, change in bowel habits, neck pain, rash, lower extremity edema. Pt sees a Nature conservation officer for chest wall pain. Last saw cardiologist approx 2 weeks ago with normal visit.  Past Medical History:  Diagnosis Date  . Anxiety   . Bipolar 1 disorder ([place])   . Chest pain   . Chronic back pain   . Chronic chest wall pain   .  Coronary artery disease   . Depression   . Diabetes mellitus    Type II  . Hallucinations    "long history of them"  . Headache(784.0)   . History of gout   . HTN (hypertension)   . Insomnia   . Neuropathy ([place])   . Pain management   . Right leg pain    chronic  . Schizophrenia ([place])   . Seizures ([place])    last sz between July 5-9th, 2016; epilepsy  . Shortness of breath dyspnea    with exertion  . Sleep apnea   . Transfusion of blood product refused for religious reason     Patient Active Problem List   Diagnosis Date Noted  . CAD (coronary artery disease) 03/28/2016  . Chest pain 03/21/2016  . Hyperlipidemia 03/11/2016  . Ischemic chest pain ([place])   . NSTEMI (non-ST elevated myocardial infarction) ([place])   . Coronary atherosclerosis of native coronary artery 03/03/2016  . Cervical spinal stenosis 12/14/2015  . Loss of weight 08/25/2015  . Essential hypertension, benign 07/23/2015  . Personal history of noncompliance with medical treatment, presenting hazards to health 07/23/2015  . Abdominal pain 06/08/2015  . Constipation 06/08/2015  . History of colonic polyps   . Diverticulosis of colon without hemorrhage   . Mucosal abnormality of stomach   . Encounter for screening colonoscopy 04/16/2015  . Dysphagia 04/16/2015  . Partial symptomatic epilepsy with complex partial seizures, intractable, without status epilepticus ([place]) 11/10/2014  .  DM type 2 causing vascular disease ([place]) 06/29/2014  . Musculoskeletal pain 06/29/2014  . Hyperglycemia without ketosis 09/07/2013  . 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 Knee pain 12/28/2010  . Medial meniscus, posterior horn derangement 12/28/2010    Past Surgical History:  Procedure Laterality Date  . ANTERIOR CERVICAL DECOMP/DISCECTOMY FUSION N/A 12/14/2015   Procedure: ANTERIOR CERVICAL DECOMPRESSION/DISCECTOMY FUSION CERVICAL FIVE -SIX;  Surgeon: Earnie Larsson, MD;  Location: [facility];  Service:  Neurosurgery;  Laterality: N/A;  . BIOPSY  05/07/2015   Procedure: BIOPSY (Gastric);  Surgeon: [name], MD;  Location: [facility];  Service: Endoscopy;;  . CARDIAC CATHETERIZATION N/A 03/07/2016   Procedure: Left Heart Cath and Coronary Angiography;  Surgeon: Belva Crome, MD;  Location: [facility];  Service: Cardiovascular;  Laterality: N/A;  . COLONOSCOPY WITH PROPOFOL N/A 05/07/2015   [id number] diverticulosis, multiple colon polyps removed, tubular adenoma, serrated colon polyp. Next colonoscopy October 2019  . CORONARY ARTERY BYPASS GRAFT N/A 03/28/2016   Procedure: CORONARY ARTERY BYPASS GRAFTING (CABG) x 5 USING GREATER SAPHENOUS VEIN;  Surgeon: [name], MD;  Location: [facility];  Service: Open Heart Surgery;  Laterality: N/A;  . ENDOVEIN HARVEST OF GREATER SAPHENOUS VEIN Right 03/28/2016   Procedure: ENDOVEIN HARVEST OF GREATER SAPHENOUS VEIN;  Surgeon: [name], MD;  Location: [facility];  Service: Open Heart Surgery;  Laterality: Right;  . ESOPHAGEAL DILATION N/A 05/07/2015   Procedure: ESOPHAGEAL DILATION WITH 56FR MALONEY DILATOR;  Surgeon: [name], MD;  Location: [facility];  Service: Endoscopy;  Laterality: N/A;  . ESOPHAGOGASTRODUODENOSCOPY (EGD) WITH PROPOFOL N/A 05/07/2015   RMR: Status post dilation of normal esophagus. Gastritis.  Marland Kitchen KNEE SURGERY Left    arthroscopy  . MANDIBLE FRACTURE SURGERY    . POLYPECTOMY  05/07/2015   Procedure: POLYPECTOMY (Hepatic Flexure, Distal Transverse Colon, Rectal);  Surgeon: [name], MD;  Location: [facility];  Service: Endoscopy;;  . TEE WITHOUT CARDIOVERSION N/A 03/28/2016   Procedure: TRANSESOPHAGEAL ECHOCARDIOGRAM (TEE);  Surgeon: [name], MD;  Location: [facility];  Service: Open Heart Surgery;  Laterality: N/A;  . TONSILLECTOMY         Home Medications    Prior to Admission medications   Medication Sig Start Date End Date Taking? Authorizing Provider  acetaminophen (TYLENOL) 325 MG tablet Take 2 tablets (650  mg total) by mouth every 6 (six) hours as needed for mild pain. 04/03/16   Donielle Liston Alba, PA-C  ALPRAZolam Duanne Moron) 1 MG tablet Take 1 mg by mouth 2 (two) times daily.    Historical Provider, MD  amLODipine (NORVASC) 10 MG tablet Take 1 tablet (10 mg total) by mouth daily. 04/18/16   Lendon Colonel, NP  atorvastatin (LIPITOR) 40 MG tablet Take 40 mg by mouth daily.    Historical Provider, MD  clopidogrel (PLAVIX) 75 MG tablet Take 1 tablet (75 mg total) by mouth daily. 04/18/16   Lendon Colonel, NP  divalproex (DEPAKOTE) 500 MG DR tablet Take 3 tablets (1,500 mg total) by mouth 2 (two) times daily. 09/21/15   Penni Bombard, MD  FLUoxetine (PROZAC) 40 MG capsule Take 40 mg by mouth daily.     Historical Provider, MD  furosemide (LASIX) 40 MG tablet Take 1 tablet (40 mg total) by mouth daily. 04/11/16   [name], MD  glucose blood (ACCU-CHEK AVIVA) test strip Use to test 4 times a day. E11.65 03/18/16   Cassandria Anger, MD  HYDROcodone-acetaminophen (NORCO) 5-325 MG tablet Take 1 tablet by mouth every 6 (six) hours as needed for severe pain. 05/03/16   Milagros Loll, MD  HYDROcodone-acetaminophen (NORCO/VICODIN) 5-325 MG tablet Take 1-2 tablets by mouth every 6 (six) hours as needed. 06/18/16   Fredia Sorrow, MD  insulin aspart (NOVOLOG) 100 UNIT/ML FlexPen Inject 5-11 Units into the skin 3 (three) times daily with meals. 03/11/16   Cassandria Anger, MD  Insulin Glargine (LANTUS SOLOSTAR [place]) Inject 30 Units into the skin at bedtime.    Historical Provider, MD  isosorbide mononitrate (IMDUR) 60 MG 24 hr tablet Take 90 mg by mouth daily.    Historical Provider, MD  levETIRAcetam (KEPPRA) 500 MG tablet Take 500 mg by mouth 2 (two) times daily.    Historical Provider, MD  linagliptin (TRADJENTA) 5 MG TABS tablet Take 5 mg by mouth daily.    Historical Provider, MD  metoprolol tartrate (LOPRESSOR) 25 MG tablet Take 1 tablet (25 mg total) by mouth 2 (two) times daily. 08/05/16  11/03/16  Herminio Commons, MD  Potassium Chloride ER 20 MEQ TBCR Take 20 mEq by mouth daily. Patient not taking: Reported on 06/18/2016 04/11/16 04/25/16  [name], MD  ranolazine (RANEXA) 500 MG 12 hr tablet Take 500 mg by mouth 2 (two) times daily.    Historical Provider, MD  tamsulosin (FLOMAX) 0.4 MG CAPS capsule Take 1 capsule (0.4 mg total) by mouth daily. 04/03/16   Donielle Liston Alba, PA-C    Family History Family History  Problem Relation Age of Onset  . Diabetes Father   . Hypertension Father   . Arthritis    . Diabetes    . Asthma    . Stroke Sister     Social History Social History  Substance Use Topics  . Smoking status: Current Every Day Smoker    Packs/day: 0.75    Years: 30.00    Types: Cigarettes    Start date: 03/28/2016  . Smokeless tobacco: Never Used  . Alcohol use No     Allergies   Aspirin and Other   Review of Systems Review of Systems  Constitutional: Negative for chills and fever.  HENT: Negative for congestion.   Eyes: Negative for visual disturbance.  Respiratory: Positive for shortness of breath. Negative for cough.   Cardiovascular: Positive for chest pain. Negative for palpitations and leg swelling.  Gastrointestinal: Negative for abdominal pain, diarrhea, nausea and vomiting.  Genitourinary: Negative for frequency, hematuria and urgency.  Musculoskeletal: Negative for back pain.  Skin: Negative.   Neurological: Positive for numbness (right leg over skin graft area). Negative for dizziness, syncope, weakness, light-headedness and headaches.  All other systems reviewed and are negative.    Physical Exam Updated Vital Signs BP 161/97 (BP Location: Right Arm)   Pulse (!) 53   Temp 98.9 F (37.2 C) (Oral)   Resp 18   SpO2 100%   Physical Exam  Constitutional: He is oriented to person, place, and time. He appears well-developed and well-nourished. No distress.  Nontoxic-appearing. Resting comfortably on the bed.  HENT:    Head: Normocephalic and atraumatic.  Mouth/Throat: Oropharynx is clear and moist.  Eyes: Conjunctivae and EOM are normal. Pupils are equal, round, and reactive to light. Right eye exhibits no discharge. Left eye exhibits no discharge. No scleral icterus.  Neck: Normal range of motion. Neck supple. No thyromegaly present.  Cardiovascular: Normal rate, regular rhythm, normal heart sounds and intact distal pulses.  Exam reveals no gallop  and no friction rub.   No murmur heard. Pulmonary/Chest: Effort normal and breath sounds normal. No respiratory distress. He has no wheezes. He has no rales. He exhibits tenderness.  Diminished breath sounds throughout.  Abdominal: Soft. Bowel sounds are normal. He exhibits no distension. There is no tenderness. There is no rebound and no guarding.  Musculoskeletal: Normal range of motion.  Moving all x-rays 4 without difficulties. No lower extremity edema or calf tenderness. Healed incision without signs of infection on the right leg. Sensation intact to sharp/dull lower extremity is. Strength 5 out of 5.  Lymphadenopathy:    He has no cervical adenopathy.  Neurological: He is alert and oriented to person, place, and time.  Skin: Skin is warm and dry. Capillary refill takes less than 2 seconds.  Nursing note and vitals reviewed.    ED Treatments / Results  Labs (all labs ordered are listed, but only abnormal results are displayed) Labs Reviewed  BASIC METABOLIC PANEL - Abnormal; Notable for the following:       Result Value   Glucose, Bld 100 (*)    All other components within normal limits  CBC - Abnormal; Notable for the following:    RBC 4.19 (*)    Hemoglobin 12.9 (*)    HCT 37.6 (*)    All other components within normal limits  I-STAT TROPOININ, ED  I-STAT TROPOININ, ED    EKG  EKG Interpretation  Date/Time:  Saturday October 08 2016 12:45:03 EST Ventricular Rate:  52 PR Interval:    QRS Duration: 88 QT Interval:  457 QTC  Calculation: 425 R Axis:   -44 Text Interpretation:  Sinus rhythm Left axis deviation Consider anterior infarct Nonspecific T abnormalities, lateral leads Confirmed by Hazle Coca [phone]) on 10/08/2016 12:51:30 PM       Radiology Dg Chest 2 View  Result Date: 10/08/2016 CLINICAL DATA:  Chest pain EXAM: CHEST  2 VIEW COMPARISON:  10/03/2016 chest radiograph. FINDINGS: Median sternotomy wires appear intact. CABG clips overlie the mediastinum. Surgical hardware from ACDF overlies the lower cervical spine. Stable cardiomediastinal silhouette with normal heart size and aortic atherosclerosis. No pneumothorax. No pleural effusion. Lungs appear clear, with no acute consolidative airspace disease and no pulmonary edema. IMPRESSION: No active cardiopulmonary disease. Aortic atherosclerosis. Electronically Signed   By: Ilona Sorrel M.D.   On: 10/08/2016 12:46    Procedures Procedures (including critical care time)  Medications Ordered in ED Medications - No data to display   Initial Impression / Assessment and Plan / ED Course  I have reviewed the triage vital signs and the nursing notes.  Pertinent labs & imaging results that were available during my care of the patient were reviewed by me and considered in my medical decision making (see chart for details).     Pt presents to the ED with cp. This has been an ongoing issue for patient. Was seen last month for same and left AMA. Has close follow up with cardiologist, and ct surgeon. NItro and morphine did not relieve pain. Tender to palpation. Pt is requesting more narcotic pain medicine. Clinical presentation does not seem consistent with ACS. Pt had negative delta troponin. Ekg with stable changes as reviewed by Dr. Ayesha Rumpf. CXR is unremarkable. Pt has normal D dimer last month. Low risk wells. Clinical presentation non consistent with PE. No lower extremity edema or calf tenderness. PT is not hypoxic or tachypnea. Denies sob at this time. All other  labs unremarkable. Exam seems consistent with msk  pain. Given lidocaine patch and heating pad with improvement in symptoms. Pt states pain worse with cold weather. VS are stable. Pt was seen and examined by Dr. Ayesha Rumpf who is agreeable to the above plan and feels pt is safe for discharge. Pt given rx for lido patches and encouraged to follow up with cardiologist and pcp this week. Strict return precautions dicussed. Pt is agreeable to the above plan and all questions answered prior to discharge.   Final Clinical Impressions(s) / ED Diagnoses   Final diagnoses:  Nonspecific chest pain    New Prescriptions Discharge Medication List as of 10/08/2016  3:30 PM    START taking these medications   Details  lidocaine (LIDODERM) 5 % Place 1 patch onto the skin daily. Remove & Discard patch within 12 hours or as directed by MD, Starting Sat 10/08/2016, Print         Doristine Devoid, PA-C 10/09/16 2148    Quintella Reichert, MD 10/15/16 1455

## 2016-10-08 NOTE — Discharge Instructions (Signed)
All blood work and imaging has been normal today. He'll follow up with your cardiothoracic surgeon in your cardiologist. Used lidocaine patches for pain. Avoid going out in the cold. Warm compresses to the chest. May take Tylenol for pain. Please continue your home medications. Return to ED if he develops worsening symptoms.

## 2016-10-08 NOTE — ED Triage Notes (Signed)
Onset this morning left chest pain, radiating to left arm, shortness of breath, dizziness, and weakness.  Pt took NTG x 1, no relief, EMS gave NTG x 1, no relief, Morphine 2 mg, no relief.  Pt is allergic to ASA - causes rash

## 2016-10-19 ENCOUNTER — Emergency Department (HOSPITAL_COMMUNITY): Payer: Medicaid Other

## 2016-10-19 ENCOUNTER — Encounter (HOSPITAL_COMMUNITY): Payer: Self-pay | Admitting: *Deleted

## 2016-10-19 ENCOUNTER — Emergency Department (HOSPITAL_COMMUNITY)
Admission: EM | Admit: 2016-10-19 | Discharge: 2016-10-19 | Disposition: A | Discharge status: Home / Self Care | Payer: Medicaid Other | Attending: Emergency Medicine | Admitting: Emergency Medicine

## 2016-10-19 DIAGNOSIS — R079 Chest pain, unspecified: Secondary | ICD-10-CM | POA: Diagnosis not present

## 2016-10-19 DIAGNOSIS — Z951 Presence of aortocoronary bypass graft: Secondary | ICD-10-CM | POA: Insufficient documentation

## 2016-10-19 DIAGNOSIS — I10 Essential (primary) hypertension: Secondary | ICD-10-CM | POA: Insufficient documentation

## 2016-10-19 DIAGNOSIS — E119 Type 2 diabetes mellitus without complications: Secondary | ICD-10-CM | POA: Insufficient documentation

## 2016-10-19 DIAGNOSIS — Z794 Long term (current) use of insulin: Secondary | ICD-10-CM | POA: Diagnosis not present

## 2016-10-19 DIAGNOSIS — F17210 Nicotine dependence, cigarettes, uncomplicated: Secondary | ICD-10-CM | POA: Diagnosis not present

## 2016-10-19 DIAGNOSIS — Z79899 Other long term (current) drug therapy: Secondary | ICD-10-CM | POA: Insufficient documentation

## 2016-10-19 DIAGNOSIS — I25810 Atherosclerosis of coronary artery bypass graft(s) without angina pectoris: Secondary | ICD-10-CM | POA: Insufficient documentation

## 2016-10-19 LAB — BASIC METABOLIC PANEL
Anion gap: 5 (ref 5–15)
BUN: 21 mg/dL — AB (ref 6–20)
CALCIUM: 8.7 mg/dL — AB (ref 8.9–10.3)
CHLORIDE: 107 mmol/L (ref 101–111)
CO2: 27 mmol/L (ref 22–32)
CREATININE: 1.12 mg/dL (ref 0.61–1.24)
GFR calc non Af Amer: >60 mL/min (ref >60)
Glucose, Bld: 121 mg/dL — ABNORMAL HIGH (ref 65–99)
Potassium: 4.2 mmol/L (ref 3.5–5.1)
SODIUM: 139 mmol/L (ref 135–145)

## 2016-10-19 LAB — CBC
HCT: 36.8 % — ABNORMAL LOW (ref 39.0–52.0)
Hemoglobin: 13.2 g/dL (ref 13.0–17.0)
MCH: 32.2 pg (ref 26.0–34.0)
MCHC: 35.9 g/dL (ref 30.0–36.0)
MCV: 89.8 fL (ref 78.0–100.0)
PLATELETS: 189 10*3/uL (ref 150–400)
RBC: 4.1 MIL/uL — ABNORMAL LOW (ref 4.22–5.81)
RDW: 12.8 % (ref 11.5–15.5)
WBC: 6 10*3/uL (ref 4.0–10.5)

## 2016-10-19 LAB — TROPONIN I

## 2016-10-19 MED ORDER — OXYCODONE-ACETAMINOPHEN 5-325 MG PO TABS: 1.0000 | ORAL_TABLET | ORAL | 0 refills | Status: DC | PRN

## 2016-10-19 MED ORDER — NITROGLYCERIN 0.4 MG SL SUBL
0.4000 mg | SUBLINGUAL_TABLET | Freq: Once | SUBLINGUAL | Status: AC
  Administered 2016-10-19: 0.4 mg via SUBLINGUAL
  Filled 2016-10-19: qty 1

## 2016-10-19 NOTE — ED Provider Notes (Signed)
[facility] DEPT Provider Note   CSN: [id number] Arrival date & time: 10/19/16  1711 By signing my name below, I, Georgette Shell, attest that this documentation has been prepared under the direction and in the presence of Nat Christen, MD. Electronically Signed: Georgette Shell, ED Scribe. 10/19/16. 8:32 PM.  History   Chief Complaint Chief Complaint  Patient presents with  . Chest Pain  . Back Pain    HPI The history is provided by the patient. No language interpreter was used.    HPI Comments: Melvin Taylor is a 51 y.o. male with h/o chronic back pain, CAD, DM, HTN, MI, seizures, and schizophrenia, who presents to the Emergency Department by EMS complaining of left-sided check pain beginning 3 days ago, worsening an hour ago. Pain is sharp in quality and worse with deep breathing. Additionally, pt also has bilateral leg pain and left arm pain. He has not tried any OTC medications PTA. Pt states he's had five bypasses prior due to MIs. Pt is a smoker. Pt denies fever, nausea, vomiting, diaphoresis, or any other associated symptoms.   Past Medical History:  Diagnosis Date  . Anxiety   . Bipolar 1 disorder ([place])   . Chest pain   . Chronic back pain   . Chronic chest wall pain   . Coronary artery disease   . Depression   . Diabetes mellitus    Type II  . Hallucinations    "long history of them"  . Headache(784.0)   . History of gout   . HTN (hypertension)   . Insomnia   . Neuropathy ([place])   . Pain management   . Right leg pain    chronic  . Schizophrenia ([place])   . Seizures ([place])    last sz between July 5-9th, 2016; epilepsy  . Shortness of breath dyspnea    with exertion  . Sleep apnea   . Transfusion of blood product refused for religious reason     Patient Active Problem List   Diagnosis Date Noted  . CAD (coronary artery disease) 03/28/2016  . Chest pain 03/21/2016  . Hyperlipidemia 03/11/2016  . Ischemic chest pain ([place])   . NSTEMI (non-ST elevated myocardial  infarction) ([place])   . Coronary atherosclerosis of native coronary artery 03/03/2016  . Cervical spinal stenosis 12/14/2015  . Loss of weight 08/25/2015  . Essential hypertension, benign 07/23/2015  . Personal history of noncompliance with medical treatment, presenting hazards to health 07/23/2015  . Abdominal pain 06/08/2015  . Constipation 06/08/2015  . History of colonic polyps   . Diverticulosis of colon without hemorrhage   . Mucosal abnormality of stomach   . Encounter for screening colonoscopy 04/16/2015  . Dysphagia 04/16/2015  . Partial symptomatic epilepsy with complex partial seizures, intractable, without status epilepticus ([place]) 11/10/2014  . DM type 2 causing vascular disease ([place]) 06/29/2014  . Musculoskeletal pain 06/29/2014  . Hyperglycemia without ketosis 09/07/2013  . 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 Knee pain 12/28/2010  . Medial meniscus, posterior horn derangement 12/28/2010    Past Surgical History:  Procedure Laterality Date  . ANTERIOR CERVICAL DECOMP/DISCECTOMY FUSION N/A 12/14/2015   Procedure: ANTERIOR CERVICAL DECOMPRESSION/DISCECTOMY FUSION CERVICAL FIVE -SIX;  Surgeon: Earnie Larsson, MD;  Location: [facility];  Service: Neurosurgery;  Laterality: N/A;  . BIOPSY  05/07/2015   Procedure: BIOPSY (Gastric);  Surgeon: [name], MD;  Location: [facility];  Service: Endoscopy;;  . CARDIAC CATHETERIZATION N/A 03/07/2016   Procedure: Left Heart Cath  and Coronary Angiography;  Surgeon: Belva Crome, MD;  Location: [facility];  Service: Cardiovascular;  Laterality: N/A;  . COLONOSCOPY WITH PROPOFOL N/A 05/07/2015   [id number] diverticulosis, multiple colon polyps removed, tubular adenoma, serrated colon polyp. Next colonoscopy October 2019  . CORONARY ARTERY BYPASS GRAFT N/A 03/28/2016   Procedure: CORONARY ARTERY BYPASS GRAFTING (CABG) x 5 USING GREATER SAPHENOUS VEIN;  Surgeon: [name], MD;  Location: [facility];  Service: Open  Heart Surgery;  Laterality: N/A;  . ENDOVEIN HARVEST OF GREATER SAPHENOUS VEIN Right 03/28/2016   Procedure: ENDOVEIN HARVEST OF GREATER SAPHENOUS VEIN;  Surgeon: [name], MD;  Location: [facility];  Service: Open Heart Surgery;  Laterality: Right;  . ESOPHAGEAL DILATION N/A 05/07/2015   Procedure: ESOPHAGEAL DILATION WITH 56FR MALONEY DILATOR;  Surgeon: [name], MD;  Location: [facility];  Service: Endoscopy;  Laterality: N/A;  . ESOPHAGOGASTRODUODENOSCOPY (EGD) WITH PROPOFOL N/A 05/07/2015   RMR: Status post dilation of normal esophagus. Gastritis.  Marland Kitchen KNEE SURGERY Left    arthroscopy  . MANDIBLE FRACTURE SURGERY    . POLYPECTOMY  05/07/2015   Procedure: POLYPECTOMY (Hepatic Flexure, Distal Transverse Colon, Rectal);  Surgeon: [name], MD;  Location: [facility];  Service: Endoscopy;;  . TEE WITHOUT CARDIOVERSION N/A 03/28/2016   Procedure: TRANSESOPHAGEAL ECHOCARDIOGRAM (TEE);  Surgeon: [name], MD;  Location: [facility];  Service: Open Heart Surgery;  Laterality: N/A;  . TONSILLECTOMY         Home Medications    Prior to Admission medications   Medication Sig Start Date End Date Taking? Authorizing Provider  acetaminophen (TYLENOL) 325 MG tablet Take 2 tablets (650 mg total) by mouth every 6 (six) hours as needed for mild pain. 04/03/16  Yes Donielle Liston Alba, PA-C  ALPRAZolam Duanne Moron) 1 MG tablet Take 1 mg by mouth 2 (two) times daily.   Yes Historical Provider, MD  amLODipine (NORVASC) 10 MG tablet Take 1 tablet (10 mg total) by mouth daily. 04/18/16  Yes Lendon Colonel, NP  atorvastatin (LIPITOR) 40 MG tablet Take 40 mg by mouth daily.   Yes Historical Provider, MD  clopidogrel (PLAVIX) 75 MG tablet Take 1 tablet (75 mg total) by mouth daily. 04/18/16  Yes Lendon Colonel, NP  divalproex (DEPAKOTE) 500 MG DR tablet Take 3 tablets (1,500 mg total) by mouth 2 (two) times daily. 09/21/15  Yes Penni Bombard, MD  FLUoxetine (PROZAC) 40 MG capsule Take 40 mg by mouth daily.     Yes Historical Provider, MD  furosemide (LASIX) 40 MG tablet Take 1 tablet (40 mg total) by mouth daily. 04/11/16  Yes [name], MD  glucose blood (ACCU-CHEK AVIVA) test strip Use to test 4 times a day. E11.65 03/18/16  Yes Cassandria Anger, MD  HYDROcodone-acetaminophen (NORCO/VICODIN) 5-325 MG tablet Take 1-2 tablets by mouth every 6 (six) hours as needed. 06/18/16  Yes Fredia Sorrow, MD  insulin aspart (NOVOLOG) 100 UNIT/ML FlexPen Inject 5-11 Units into the skin 3 (three) times daily with meals. 03/11/16  Yes Cassandria Anger, MD  Insulin Glargine (LANTUS SOLOSTAR [place]) Inject 30 Units into the skin at bedtime.   Yes Historical Provider, MD  isosorbide mononitrate (IMDUR) 60 MG 24 hr tablet Take 90 mg by mouth daily.   Yes Historical Provider, MD  levETIRAcetam (KEPPRA) 500 MG tablet Take 500 mg by mouth 2 (two) times daily.   Yes Historical Provider, MD  lidocaine (LIDODERM) 5 % Place 1 patch  onto the skin daily. Remove & Discard patch within 12 hours or as directed by MD 10/08/16  Yes Doristine Devoid, PA-C  ranolazine (RANEXA) 500 MG 12 hr tablet Take 500 mg by mouth 2 (two) times daily.   Yes Historical Provider, MD  tamsulosin (FLOMAX) 0.4 MG CAPS capsule Take 1 capsule (0.4 mg total) by mouth daily. 04/03/16  Yes Donielle Liston Alba, PA-C  HYDROcodone-acetaminophen (NORCO) 5-325 MG tablet Take 1 tablet by mouth every 6 (six) hours as needed for severe pain. Patient not taking: Reported on 10/08/2016 05/03/16   Milagros Loll, MD  metoprolol tartrate (LOPRESSOR) 25 MG tablet Take 1 tablet (25 mg total) by mouth 2 (two) times daily. Patient not taking: Reported on 10/19/2016 08/05/16 11/03/16  Herminio Commons, MD  oxyCODONE-acetaminophen (PERCOCET/ROXICET) 5-325 MG tablet Take 1-2 tablets by mouth every 4 (four) hours as needed for severe pain. 10/19/16   Nat Christen, MD  Potassium Chloride ER 20 MEQ TBCR Take 20 mEq by mouth daily. Patient not taking: Reported on 06/18/2016  04/11/16 04/25/16  [name], MD    Family History Family History  Problem Relation Age of Onset  . Diabetes Father   . Hypertension Father   . Arthritis    . Diabetes    . Asthma    . Stroke Sister     Social History Social History  Substance Use Topics  . Smoking status: Current Every Day Smoker    Packs/day: 0.75    Years: 30.00    Types: Cigarettes    Start date: 03/28/2016  . Smokeless tobacco: Never Used  . Alcohol use No     Allergies   Aspirin and Other   Review of Systems Review of Systems 10 Systems reviewed and all are negative for acute change except as noted in the HPI. Physical Exam Updated Vital Signs BP (!) 150/97   Pulse (!) 46   Temp 98.7 F (37.1 C) (Oral)   Resp 16   Ht 5\' 7"  (1.702 m)   Wt 140 lb (63.5 kg)   SpO2 100%   BMI 21.93 kg/m   Physical Exam  Constitutional: He is oriented to person, place, and time. He appears well-developed and well-nourished.  HENT:  Head: Normocephalic and atraumatic.  Eyes: Conjunctivae are normal.  Neck: Neck supple.  Cardiovascular: Normal rate, regular rhythm and normal heart sounds.   Pulmonary/Chest: Effort normal and breath sounds normal.  Abdominal: Soft. Bowel sounds are normal.  Musculoskeletal: Normal range of motion.  Neurological: He is alert and oriented to person, place, and time.  Skin: Skin is warm and dry.  Psychiatric: He has a normal mood and affect. His behavior is normal.  Nursing note and vitals reviewed.    ED Treatments / Results  DIAGNOSTIC STUDIES: Oxygen Saturation is 99% on RA, normal by my interpretation.    COORDINATION OF CARE: 8:32 PM Discussed treatment plan with pt at bedside and pt agreed to plan.  Labs (all labs ordered are listed, but only abnormal results are displayed) Labs Reviewed  BASIC METABOLIC PANEL - Abnormal; Notable for the following:       Result Value   Glucose, Bld 121 (*)    BUN 21 (*)    Calcium 8.7 (*)    All other components within  normal limits  CBC - Abnormal; Notable for the following:    RBC 4.10 (*)    HCT 36.8 (*)    All other components within normal limits  TROPONIN  I    EKG  EKG Interpretation  Date/Time:  Wednesday October 19 2016 17:31:58 EDT Ventricular Rate:  61 PR Interval:  182 QRS Duration: 76 QT Interval:  400 QTC Calculation: 402 R Axis:   -48 Text Interpretation:  Normal sinus rhythm Left axis deviation Anteroseptal infarct , age undetermined Abnormal ECG Confirmed by Kiven Vangilder  MD, Rakwon Letourneau ([id number]) on 10/19/2016 9:40:57 PM       Radiology Dg Chest 2 View  Result Date: 10/19/2016 CLINICAL DATA:  Bilateral leg pain, chest pain EXAM: CHEST  2 VIEW COMPARISON:  10/08/2016 FINDINGS: Cardiomediastinal silhouette is stable. Status post median sternotomy. No infiltrate or pleural effusion. No pulmonary edema. Bony thorax is unremarkable. IMPRESSION: No active cardiopulmonary disease. Electronically Signed   By: Lahoma Crocker M.D.   On: 10/19/2016 18:29    Procedures Procedures (including critical care time)  Medications Ordered in ED Medications  nitroGLYCERIN (NITROSTAT) SL tablet 0.4 mg (0.4 mg Sublingual Given 10/19/16 2104)     Initial Impression / Assessment and Plan / ED Course  I have reviewed the triage vital signs and the nursing notes.  Pertinent labs & imaging results that were available during my care of the patient were reviewed by me and considered in my medical decision making (see chart for details).     Patient is in no acute distress. Screening labs, EKG, chest x-ray, troponin all negative. I suspect he has radicular pain down his legs. He has cardiology follow-up. Discharge medication Percocet #12.  Final Clinical Impressions(s) / ED Diagnoses   Final diagnoses:  Chest pain, unspecified type    New Prescriptions New Prescriptions   OXYCODONE-ACETAMINOPHEN (PERCOCET/ROXICET) 5-325 MG TABLET    Take 1-2 tablets by mouth every 4 (four) hours as needed for severe pain.   I  personally performed the services described in this documentation, which was scribed in my presence. The recorded information has been reviewed and is accurate.      Nat Christen, MD 10/19/16 2221

## 2016-10-19 NOTE — Discharge Instructions (Signed)
Tests showed no life-threatening condition. Follow-up your cardiologist. Prescription for pain medicine

## 2016-10-19 NOTE — ED Triage Notes (Addendum)
Pt brought in by [facility] c/o back, bilateral leg pain, left arm pain, left sided chest pain that started approximately 1 hour ago.Pt also reports the chest pain is worse with deep breathing. Denies dizziness, nausea and vomiting.

## 2016-11-17 ENCOUNTER — Other Ambulatory Visit: Payer: Self-pay | Admitting: "Endocrinology

## 2016-11-18 LAB — COMPREHENSIVE METABOLIC PANEL
ALT: 13 IU/L (ref 0–44)
AST: 20 IU/L (ref 0–40)
Albumin/Globulin Ratio: 2.1 (ref 1.2–2.2)
Albumin: 4.2 g/dL (ref 3.5–5.5)
Alkaline Phosphatase: 44 IU/L (ref 39–117)
BUN/Creatinine Ratio: 15 (ref 9–20)
BUN: 19 mg/dL (ref 6–24)
Bilirubin Total: 0.5 mg/dL (ref 0.0–1.2)
CALCIUM: 8.7 mg/dL (ref 8.7–10.2)
CO2: 27 mmol/L (ref 18–29)
CREATININE: 1.31 mg/dL — AB (ref 0.76–1.27)
Chloride: 100 mmol/L (ref 96–106)
GFR calc Af Amer: 72 mL/min/{1.73_m2} (ref >59)
GFR, EST NON AFRICAN AMERICAN: 62 mL/min/{1.73_m2} (ref >59)
GLUCOSE: 173 mg/dL — AB (ref 65–99)
Globulin, Total: 2 g/dL (ref 1.5–4.5)
POTASSIUM: 4.4 mmol/L (ref 3.5–5.2)
Sodium: 140 mmol/L (ref 134–144)
TOTAL PROTEIN: 6.2 g/dL (ref 6.0–8.5)

## 2016-11-18 LAB — AMBIG ABBREV CMP14 DEFAULT

## 2016-11-18 LAB — HGB A1C W/O EAG: Hgb A1c MFr Bld: 6.1 % — ABNORMAL HIGH (ref 4.8–5.6)

## 2016-12-18 ENCOUNTER — Emergency Department (HOSPITAL_COMMUNITY): Payer: Medicaid Other

## 2016-12-18 ENCOUNTER — Encounter (HOSPITAL_COMMUNITY): Payer: Self-pay | Admitting: Emergency Medicine

## 2016-12-18 ENCOUNTER — Emergency Department (HOSPITAL_COMMUNITY)
Admission: EM | Admit: 2016-12-18 | Discharge: 2016-12-18 | Disposition: A | Discharge status: Home / Self Care | Payer: Medicaid Other | Attending: Emergency Medicine | Admitting: Emergency Medicine

## 2016-12-18 DIAGNOSIS — Z951 Presence of aortocoronary bypass graft: Secondary | ICD-10-CM | POA: Insufficient documentation

## 2016-12-18 DIAGNOSIS — E1140 Type 2 diabetes mellitus with diabetic neuropathy, unspecified: Secondary | ICD-10-CM | POA: Insufficient documentation

## 2016-12-18 DIAGNOSIS — Z9114 Patient's other noncompliance with medication regimen: Secondary | ICD-10-CM | POA: Insufficient documentation

## 2016-12-18 DIAGNOSIS — F17210 Nicotine dependence, cigarettes, uncomplicated: Secondary | ICD-10-CM | POA: Diagnosis not present

## 2016-12-18 DIAGNOSIS — Z79899 Other long term (current) drug therapy: Secondary | ICD-10-CM | POA: Diagnosis not present

## 2016-12-18 DIAGNOSIS — Z794 Long term (current) use of insulin: Secondary | ICD-10-CM | POA: Diagnosis not present

## 2016-12-18 DIAGNOSIS — I2510 Atherosclerotic heart disease of native coronary artery without angina pectoris: Secondary | ICD-10-CM | POA: Diagnosis not present

## 2016-12-18 DIAGNOSIS — R0789 Other chest pain: Secondary | ICD-10-CM | POA: Insufficient documentation

## 2016-12-18 DIAGNOSIS — I252 Old myocardial infarction: Secondary | ICD-10-CM | POA: Insufficient documentation

## 2016-12-18 DIAGNOSIS — R569 Unspecified convulsions: Secondary | ICD-10-CM | POA: Diagnosis not present

## 2016-12-18 DIAGNOSIS — R51 Headache: Secondary | ICD-10-CM | POA: Insufficient documentation

## 2016-12-18 DIAGNOSIS — I10 Essential (primary) hypertension: Secondary | ICD-10-CM | POA: Insufficient documentation

## 2016-12-18 LAB — COMPREHENSIVE METABOLIC PANEL
ALT: 18 U/L (ref 17–63)
AST: 20 U/L (ref 15–41)
Albumin: 4 g/dL (ref 3.5–5.0)
Alkaline Phosphatase: 39 U/L (ref 38–126)
Anion gap: 8 (ref 5–15)
BUN: 17 mg/dL (ref 6–20)
CHLORIDE: 108 mmol/L (ref 101–111)
CO2: 24 mmol/L (ref 22–32)
CREATININE: 0.96 mg/dL (ref 0.61–1.24)
Calcium: 9 mg/dL (ref 8.9–10.3)
GFR calc Af Amer: >60 mL/min (ref >60)
GFR calc non Af Amer: >60 mL/min (ref >60)
Glucose, Bld: 76 mg/dL (ref 65–99)
POTASSIUM: 3.7 mmol/L (ref 3.5–5.1)
Sodium: 140 mmol/L (ref 135–145)
Total Bilirubin: 0.7 mg/dL (ref 0.3–1.2)
Total Protein: 6.5 g/dL (ref 6.5–8.1)

## 2016-12-18 LAB — TROPONIN I

## 2016-12-18 LAB — VALPROIC ACID LEVEL

## 2016-12-18 MED ORDER — VALPROATE SODIUM 500 MG/5ML IV SOLN
INTRAVENOUS | Status: AC
  Filled 2016-12-18: qty 10

## 2016-12-18 MED ORDER — VALPROATE SODIUM 500 MG/5ML IV SOLN
1000.0000 mg | Freq: Once | INTRAVENOUS | Status: AC
  Administered 2016-12-18: 1000 mg via INTRAVENOUS
  Filled 2016-12-18: qty 10

## 2016-12-18 MED ORDER — MORPHINE SULFATE (PF) 4 MG/ML IV SOLN
4.0000 mg | Freq: Once | INTRAVENOUS | Status: AC
  Administered 2016-12-18: 4 mg via INTRAVENOUS
  Filled 2016-12-18: qty 1

## 2016-12-18 MED ORDER — ACETAMINOPHEN 500 MG PO TABS
1000.0000 mg | ORAL_TABLET | Freq: Once | ORAL | Status: AC
  Administered 2016-12-18: 1000 mg via ORAL
  Filled 2016-12-18: qty 2

## 2016-12-18 NOTE — ED Provider Notes (Addendum)
[facility] DEPT Provider Note   CSN: [id number] Arrival date & time: 12/18/16  1652     History   Chief Complaint Chief Complaint  Patient presents with  . Seizures    HPI Melvin Taylor is a 51 y.o. male.Patient reports she had a grand mal seizure immediately prior to coming here. He presently complains of headache and anterior chest pain will chest pain is worse with changing positions and improved with remaining still. He denies any shortness of breath does not feel like heart pain he's had in the past headache is typical of headaches he gets after a seizure. No injury is a result of seizure. No other associated symptoms. Denies shoulder pain Denies noncompliance with his medications. States he didn't take his Depakote today but has not missed any other doses.  HPI  Past Medical History:  Diagnosis Date  . Anxiety   . Bipolar 1 disorder ([place])   . Chest pain   . Chronic back pain   . Chronic chest wall pain   . Coronary artery disease   . Depression   . Diabetes mellitus    Type II  . Hallucinations    "long history of them"  . Headache(784.0)   . History of gout   . HTN (hypertension)   . Insomnia   . Neuropathy   . Pain management   . Right leg pain    chronic  . Schizophrenia ([place])   . Seizures ([place])    last sz between July 5-9th, 2016; epilepsy  . Shortness of breath dyspnea    with exertion  . Sleep apnea   . Transfusion of blood product refused for religious reason     Patient Active Problem List   Diagnosis Date Noted  . CAD (coronary artery disease) 03/28/2016  . Chest pain 03/21/2016  . Hyperlipidemia 03/11/2016  . Ischemic chest pain   . NSTEMI (non-ST elevated myocardial infarction) ([place])   . Coronary atherosclerosis of native coronary artery 03/03/2016  . Cervical spinal stenosis 12/14/2015  . Loss of weight 08/25/2015  . Essential hypertension, benign 07/23/2015  . Personal history of noncompliance with medical treatment, presenting  hazards to health 07/23/2015  . Abdominal pain 06/08/2015  . Constipation 06/08/2015  . History of colonic polyps   . Diverticulosis of colon without hemorrhage   . Mucosal abnormality of stomach   . Encounter for screening colonoscopy 04/16/2015  . Dysphagia 04/16/2015  . Partial symptomatic epilepsy with complex partial seizures, intractable, without status epilepticus ([place]) 11/10/2014  . DM type 2 causing vascular disease ([place]) 06/29/2014  . Musculoskeletal pain 06/29/2014  . Hyperglycemia without ketosis 09/07/2013  . 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 Knee pain 12/28/2010  . Medial meniscus, posterior horn derangement 12/28/2010    Past Surgical History:  Procedure Laterality Date  . ANTERIOR CERVICAL DECOMP/DISCECTOMY FUSION N/A 12/14/2015   Procedure: ANTERIOR CERVICAL DECOMPRESSION/DISCECTOMY FUSION CERVICAL FIVE -SIX;  Surgeon: Earnie Larsson, MD;  Location: [facility];  Service: Neurosurgery;  Laterality: N/A;  . BIOPSY  05/07/2015   Procedure: BIOPSY (Gastric);  Surgeon: [name], MD;  Location: [facility];  Service: Endoscopy;;  . CARDIAC CATHETERIZATION N/A 03/07/2016   Procedure: Left Heart Cath and Coronary Angiography;  Surgeon: Belva Crome, MD;  Location: [facility];  Service: Cardiovascular;  Laterality: N/A;  . COLONOSCOPY WITH PROPOFOL N/A 05/07/2015   [id number] diverticulosis, multiple colon polyps removed, tubular adenoma, serrated colon polyp. Next colonoscopy October 2019  . CORONARY  ARTERY BYPASS GRAFT N/A 03/28/2016   Procedure: CORONARY ARTERY BYPASS GRAFTING (CABG) x 5 USING GREATER SAPHENOUS VEIN;  Surgeon: [name], MD;  Location: [facility];  Service: Open Heart Surgery;  Laterality: N/A;  . ENDOVEIN HARVEST OF GREATER SAPHENOUS VEIN Right 03/28/2016   Procedure: ENDOVEIN HARVEST OF GREATER SAPHENOUS VEIN;  Surgeon: [name], MD;  Location: [facility];  Service: Open Heart Surgery;  Laterality: Right;  . ESOPHAGEAL DILATION  N/A 05/07/2015   Procedure: ESOPHAGEAL DILATION WITH 56FR MALONEY DILATOR;  Surgeon: [name], MD;  Location: [facility];  Service: Endoscopy;  Laterality: N/A;  . ESOPHAGOGASTRODUODENOSCOPY (EGD) WITH PROPOFOL N/A 05/07/2015   RMR: Status post dilation of normal esophagus. Gastritis.  Marland Kitchen KNEE SURGERY Left    arthroscopy  . MANDIBLE FRACTURE SURGERY    . POLYPECTOMY  05/07/2015   Procedure: POLYPECTOMY (Hepatic Flexure, Distal Transverse Colon, Rectal);  Surgeon: [name], MD;  Location: [facility];  Service: Endoscopy;;  . TEE WITHOUT CARDIOVERSION N/A 03/28/2016   Procedure: TRANSESOPHAGEAL ECHOCARDIOGRAM (TEE);  Surgeon: [name], MD;  Location: [facility];  Service: Open Heart Surgery;  Laterality: N/A;  . TONSILLECTOMY         Home Medications    Prior to Admission medications   Medication Sig Start Date End Date Taking? Authorizing Provider  acetaminophen (TYLENOL) 325 MG tablet Take 2 tablets (650 mg total) by mouth every 6 (six) hours as needed for mild pain. 04/03/16   Nani Skillern, PA-C  ALPRAZolam Duanne Moron) 1 MG tablet Take 1 mg by mouth 2 (two) times daily.    [provider]  amLODipine (NORVASC) 10 MG tablet Take 1 tablet (10 mg total) by mouth daily. 04/18/16   Lendon Colonel, NP  atorvastatin (LIPITOR) 40 MG tablet Take 40 mg by mouth daily.    [provider]  clopidogrel (PLAVIX) 75 MG tablet Take 1 tablet (75 mg total) by mouth daily. 04/18/16   Lendon Colonel, NP  divalproex (DEPAKOTE) 500 MG DR tablet Take 3 tablets (1,500 mg total) by mouth 2 (two) times daily. 09/21/15   Penumalli, Earlean Polka, MD  FLUoxetine (PROZAC) 40 MG capsule Take 40 mg by mouth daily.     [provider]  furosemide (LASIX) 40 MG tablet Take 1 tablet (40 mg total) by mouth daily. 04/11/16   [name], MD  glucose blood (ACCU-CHEK AVIVA) test strip Use to test 4 times a day. E11.65 03/18/16   Cassandria Anger, MD  HYDROcodone-acetaminophen (NORCO)  5-325 MG tablet Take 1 tablet by mouth every 6 (six) hours as needed for severe pain. Patient not taking: Reported on 10/08/2016 05/03/16   Milagros Loll, MD  HYDROcodone-acetaminophen (NORCO/VICODIN) 5-325 MG tablet Take 1-2 tablets by mouth every 6 (six) hours as needed. 06/18/16   Fredia Sorrow, MD  insulin aspart (NOVOLOG) 100 UNIT/ML FlexPen Inject 5-11 Units into the skin 3 (three) times daily with meals. 03/11/16   Cassandria Anger, MD  Insulin Glargine (LANTUS SOLOSTAR ) Inject 30 Units into the skin at bedtime.    [provider]  isosorbide mononitrate (IMDUR) 60 MG 24 hr tablet Take 90 mg by mouth daily.    [provider]  levETIRAcetam (KEPPRA) 500 MG tablet Take 500 mg by mouth 2 (two) times daily.    [provider]  lidocaine (LIDODERM) 5 % Place 1 patch onto the skin daily. Remove & Discard patch within 12 hours or as directed by  MD 10/08/16   Doristine Devoid, PA-C  metoprolol tartrate (LOPRESSOR) 25 MG tablet Take 1 tablet (25 mg total) by mouth 2 (two) times daily. Patient not taking: Reported on 10/19/2016 08/05/16 11/03/16  Herminio Commons, MD  oxyCODONE-acetaminophen (PERCOCET/ROXICET) 5-325 MG tablet Take 1-2 tablets by mouth every 4 (four) hours as needed for severe pain. 10/19/16   Nat Christen, MD  Potassium Chloride ER 20 MEQ TBCR Take 20 mEq by mouth daily. Patient not taking: Reported on 06/18/2016 04/11/16 04/25/16  [name], MD  ranolazine (RANEXA) 500 MG 12 hr tablet Take 500 mg by mouth 2 (two) times daily.    [provider]  tamsulosin (FLOMAX) 0.4 MG CAPS capsule Take 1 capsule (0.4 mg total) by mouth daily. 04/03/16   Nani Skillern, PA-C    Family History Family History  Problem Relation Age of Onset  . Diabetes Father   . Hypertension Father   . Arthritis Unknown   . Diabetes Unknown   . Asthma Unknown   . Stroke Sister     Social History Social History  Substance Use Topics  . Smoking  status: Current Every Day Smoker    Packs/day: 0.75    Years: 30.00    Types: Cigarettes    Start date: 03/28/2016  . Smokeless tobacco: Never Used  . Alcohol use No     Allergies   Aspirin and Other   Review of Systems Review of Systems   Physical Exam Updated Vital Signs BP (!) 149/93   Pulse 69   Temp 98.9 F (37.2 C) (Oral)   Resp 13   SpO2 99%   Physical Exam  Constitutional: He is oriented to person, place, and time. He appears well-developed and well-nourished. No distress.  Alert Glasgow Coma Score 15  HENT:  Head: Normocephalic and atraumatic.  Eyes: Conjunctivae are normal. Pupils are equal, round, and reactive to light.  Neck: Neck supple. No tracheal deviation present. No thyromegaly present.  Cardiovascular: Normal rate and regular rhythm.   No murmur heard. Pulmonary/Chest: Effort normal and breath sounds normal. He exhibits tenderness.  Chest tender anteriorly, reproducing pain exactly  Abdominal: Soft. Bowel sounds are normal. He exhibits no distension. There is no tenderness.  Musculoskeletal: Normal range of motion. He exhibits no edema or tenderness.  All 4 extremities or contusion abrasion or tenderness neurovascularly intact  Neurological: He is alert and oriented to person, place, and time. He displays normal reflexes. No cranial nerve deficit. Coordination normal.  DTR symmetric bilaterally at knee jerk ankle jerk and biceps  Skin: Skin is warm and dry. No rash noted.  Psychiatric: He has a normal mood and affect.  Nursing note and vitals reviewed.    ED Treatments / Results  Labs (all labs ordered are listed, but only abnormal results are displayed) Labs Reviewed  VALPROIC ACID LEVEL  COMPREHENSIVE METABOLIC PANEL    EKG  EKG Interpretation  Date/Time:  Sunday Dec 18 2016 18:20:24 EDT Ventricular Rate:  78 PR Interval:    QRS Duration: 82 QT Interval:  396 QTC Calculation: 452 R Axis:   -35 Text Interpretation:  Sinus rhythm  Probable left atrial enlargement Left axis deviation Consider anterior infarct Minimal ST elevation, lateral leads No significant change since last tracing Confirmed by Winfred Leeds  MD, Verdene Creson [phone]) on 12/18/2016 6:25:11 PM       Radiology No results found.  Procedures Procedures (including critical care time)  Medications Ordered in ED Medications  morphine 4 MG/ML injection  4 mg (not administered)   Chest x-ray viewed by me Results for orders placed or performed during the hospital encounter of 12/18/16  Valproic acid level  Result Value Ref Range   Valproic Acid Lvl <10 (L) 50.0 - 100.0 ug/mL  Comprehensive metabolic panel  Result Value Ref Range   Sodium 140 135 - 145 mmol/L   Potassium 3.7 3.5 - 5.1 mmol/L   Chloride 108 101 - 111 mmol/L   CO2 24 22 - 32 mmol/L   Glucose, Bld 76 65 - 99 mg/dL   BUN 17 6 - 20 mg/dL   Creatinine, Ser 0.96 0.61 - 1.24 mg/dL   Calcium 9.0 8.9 - 10.3 mg/dL   Total Protein 6.5 6.5 - 8.1 g/dL   Albumin 4.0 3.5 - 5.0 g/dL   AST 20 15 - 41 U/L   ALT 18 17 - 63 U/L   Alkaline Phosphatase 39 38 - 126 U/L   Total Bilirubin 0.7 0.3 - 1.2 mg/dL   GFR calc non Af Amer >60 >60 mL/min   GFR calc Af Amer >60 >60 mL/min   Anion gap 8 5 - 15  Troponin I  Result Value Ref Range   Troponin I <0.03 <0.03 ng/mL   Dg Chest Port 1 View  Result Date: 12/18/2016 CLINICAL DATA:  Recent seizure activity EXAM: PORTABLE CHEST 1 VIEW COMPARISON:  10/19/2016 FINDINGS: Postsurgical changes are noted. Cardiac shadow is within normal limits. The lungs are clear bilaterally. No acute bony abnormality is seen. IMPRESSION: No active disease. Electronically Signed   By: Inez Catalina M.D.   On: 12/18/2016 19:02    Initial Impression / Assessment and Plan / ED Course  I have reviewed the triage vital signs and the nursing notes.  Pertinent labs & imaging results that were available during my care of the patient were reviewed by me and considered in my medical decision  making (see chart for details).    7:10 PM pain improved after treatment with intravenous morphine. Requests additional pain medicine. Tylenol ordered. On further questioning patient does admit that he has been likely skipped doses of Depakote from time to time. Thus, Depakote level subtherapeutic Depacon IV load ordered  Continues to complain of headache after treatment with Tylenol and morphine.  however he does feel ready to go home. He has received his IV loading dose of Depacon He is alert and ambulatory. Glasgow Coma Score 15. Patient does not drive Plan: He is instructed to take Depakote as prescribed. Blood pressure recheck 1 week Final Clinical Impressions(s) / ED Diagnoses  Diagnosis #1 seizure #2 medication noncompliance Final diagnoses:  None   #3 elevated blood pressure #4 nonspecific headache #5 chest wall pain New Prescriptions New Prescriptions   No medications on file     Orlie Dakin, MD 12/18/16 2009    Orlie Dakin, MD 12/18/16 2012

## 2016-12-18 NOTE — ED Notes (Signed)
AC called to bring Depacon at this time.

## 2016-12-18 NOTE — ED Notes (Signed)
cbg in route. 95

## 2016-12-18 NOTE — ED Triage Notes (Signed)
Pt had seizure pta. Pt does not appear post ictal. Pt states was sitting down but woke up on floor. Pt c/o left chest/shoulder pain and ha. Nad. A/o. States is compliant with meds. depakote level checked 2 weeks ago and was normal

## 2016-12-18 NOTE — ED Notes (Signed)
Pt states he had an unwitnessed seizure today while he was sitting in a chair. States he now has L sided rib pain, worse with deep inspiration. Did not take Depakote today, usually takes TID.

## 2016-12-18 NOTE — Discharge Instructions (Signed)
Take your Depakote and all of your medications as prescribed. Get your blood Depakote level rechecked in a week. Get your blood pressure rechecked in a week by your primary care physician. Today's was elevated at 166/98

## 2016-12-20 ENCOUNTER — Encounter: Payer: Self-pay | Admitting: Diagnostic Neuroimaging

## 2016-12-20 ENCOUNTER — Telehealth: Payer: Self-pay | Admitting: Diagnostic Neuroimaging

## 2016-12-20 ENCOUNTER — Other Ambulatory Visit: Payer: Self-pay | Admitting: Diagnostic Neuroimaging

## 2016-12-20 NOTE — Telephone Encounter (Signed)
Refill x 1 month e scribed to [facility]. No sooner appointments available. Patient must come in for FU on 01/04/17 to receive further refills.

## 2016-12-20 NOTE — Telephone Encounter (Addendum)
Called patient and advised him a month's refill has been sent to [facility]. Advised there is not a sooner follow up than June 6th. Advised he pick up Depakote today and take as directed. Confirmed his FU date, time and advised he must come in to receive further refills. Patient stated he did not come for FU last year because he had heart bypass surgery in August.  He stated he would definitely come in for FU on 01/04/17. He verbalized understanding, appreciation of call.

## 2016-12-20 NOTE — Telephone Encounter (Signed)
Refill x 1 month e scribed to [facility]. The patient must keep FU on 01/04/17. No sooner appointment available.

## 2016-12-20 NOTE — Telephone Encounter (Signed)
Pt called said he had a seizure and was seen at the ED yesterday. An appt was scheduled for 6/6 with Dr Mamie Nick, NP's did not have any openings. Can this pt be seen sooner? Also pt said he is out of divalproex (DEPAKOTE) 500 MG DR tablet . A refill request has been sent from the pharmacy. Please call to advise

## 2016-12-21 ENCOUNTER — Encounter: Payer: Self-pay | Admitting: "Endocrinology

## 2016-12-21 ENCOUNTER — Ambulatory Visit (INDEPENDENT_AMBULATORY_CARE_PROVIDER_SITE_OTHER): Payer: Medicaid Other | Admitting: "Endocrinology

## 2016-12-21 VITALS — BP 139/80 | HR 76 | Ht 67.0 in | Wt 157.0 lb

## 2016-12-21 DIAGNOSIS — E1159 Type 2 diabetes mellitus with other circulatory complications: Secondary | ICD-10-CM | POA: Diagnosis not present

## 2016-12-21 DIAGNOSIS — E782 Mixed hyperlipidemia: Secondary | ICD-10-CM | POA: Diagnosis not present

## 2016-12-21 DIAGNOSIS — I10 Essential (primary) hypertension: Secondary | ICD-10-CM

## 2016-12-21 NOTE — Progress Notes (Signed)
Subjective:    Patient ID: Melvin Taylor, male    DOB: 1965-10-26, PCP Rosita Fire, MD   Past Medical History:  Diagnosis Date  . Anxiety   . Bipolar 1 disorder ([place])   . Chest pain   . Chronic back pain   . Chronic chest wall pain   . Coronary artery disease   . Depression   . Diabetes mellitus    Type II  . Hallucinations    "long history of them"  . Headache(784.0)   . History of gout   . HTN (hypertension)   . Insomnia   . Neuropathy   . Pain management   . Right leg pain    chronic  . Schizophrenia ([place])   . Seizures ([place])    last sz between July 5-9th, 2016; epilepsy  . Shortness of breath dyspnea    with exertion  . Sleep apnea   . Transfusion of blood product refused for religious reason    Past Surgical History:  Procedure Laterality Date  . ANTERIOR CERVICAL DECOMP/DISCECTOMY FUSION N/A 12/14/2015   Procedure: ANTERIOR CERVICAL DECOMPRESSION/DISCECTOMY FUSION CERVICAL FIVE -SIX;  Surgeon: Earnie Larsson, MD;  Location: [facility];  Service: Neurosurgery;  Laterality: N/A;  . BIOPSY  05/07/2015   Procedure: BIOPSY (Gastric);  Surgeon: [name], MD;  Location: [facility];  Service: Endoscopy;;  . CARDIAC CATHETERIZATION N/A 03/07/2016   Procedure: Left Heart Cath and Coronary Angiography;  Surgeon: Belva Crome, MD;  Location: [facility];  Service: Cardiovascular;  Laterality: N/A;  . COLONOSCOPY WITH PROPOFOL N/A 05/07/2015   NOM:[id number] diverticulosis, multiple colon polyps removed, tubular adenoma, serrated colon polyp. Next colonoscopy October 2019  . CORONARY ARTERY BYPASS GRAFT N/A 03/28/2016   Procedure: CORONARY ARTERY BYPASS GRAFTING (CABG) x 5 USING GREATER SAPHENOUS VEIN;  Surgeon: [name], MD;  Location: [facility];  Service: Open Heart Surgery;  Laterality: N/A;  . ENDOVEIN HARVEST OF GREATER SAPHENOUS VEIN Right 03/28/2016   Procedure: ENDOVEIN HARVEST OF GREATER SAPHENOUS VEIN;  Surgeon: [name], MD;  Location: [facility];   Service: Open Heart Surgery;  Laterality: Right;  . ESOPHAGEAL DILATION N/A 05/07/2015   Procedure: ESOPHAGEAL DILATION WITH 56FR MALONEY DILATOR;  Surgeon: [name], MD;  Location: [facility];  Service: Endoscopy;  Laterality: N/A;  . ESOPHAGOGASTRODUODENOSCOPY (EGD) WITH PROPOFOL N/A 05/07/2015   RMR: Status post dilation of normal esophagus. Gastritis.  Marland Kitchen KNEE SURGERY Left    arthroscopy  . MANDIBLE FRACTURE SURGERY    . POLYPECTOMY  05/07/2015   Procedure: POLYPECTOMY (Hepatic Flexure, Distal Transverse Colon, Rectal);  Surgeon: [name], MD;  Location: [facility];  Service: Endoscopy;;  . TEE WITHOUT CARDIOVERSION N/A 03/28/2016   Procedure: TRANSESOPHAGEAL ECHOCARDIOGRAM (TEE);  Surgeon: [name], MD;  Location: [facility];  Service: Open Heart Surgery;  Laterality: N/A;  . TONSILLECTOMY     Social History   Social History  . Marital status: Single    Spouse name: N/A  . Number of children: N/A  . Years of education: 11th grade   Occupational History  . unemployed Not Employed   Social History Main Topics  . Smoking status: Current Every Day Smoker    Packs/day: 0.75    Years: 30.00    Types: Cigarettes    Start date: 03/28/2016  . Smokeless tobacco: Never Used  . Alcohol use No  . Drug use: No  . Sexual activity: Not Currently  Other Topics Concern  . None   Social History Narrative   Lives alone   Drinks a cup of coffee a day   Outpatient Encounter Prescriptions as of 12/21/2016  Medication Sig  . ALPRAZolam (XANAX) 1 MG tablet Take 1 mg by mouth 2 (two) times daily as needed for anxiety.   Marland Kitchen amLODipine (NORVASC) 10 MG tablet Take 1 tablet (10 mg total) by mouth daily.  Marland Kitchen atorvastatin (LIPITOR) 40 MG tablet Take 40 mg by mouth daily.  . clopidogrel (PLAVIX) 75 MG tablet Take 1 tablet (75 mg total) by mouth daily.  . divalproex (DEPAKOTE) 500 MG DR tablet TAKE 3 TABLETS BY MOUTH TWICE DAILY.  Marland Kitchen Doxepin HCl 5 % CREA Apply 2 g topically 3 (three) times daily. Pt  applies to chest and legs.  . furosemide (LASIX) 40 MG tablet Take 1 tablet (40 mg total) by mouth daily.  . insulin glargine (LANTUS) 100 unit/mL SOPN Inject 15 Units into the skin at bedtime.  . isosorbide mononitrate (IMDUR) 60 MG 24 hr tablet Take 90 mg by mouth daily.  Marland Kitchen lidocaine (LIDODERM) 5 % Place 1 patch onto the skin daily. Remove & Discard patch within 12 hours or as directed by MD (Patient taking differently: Place 1 patch onto the skin daily as needed (for pain). Remove & Discard patch within 12 hours or as directed by MD)  . lidocaine (XYLOCAINE) 5 % ointment Apply 2-3 application topically 4 (four) times daily. Pt applies to chest and legs.  . metoprolol tartrate (LOPRESSOR) 25 MG tablet Take 25 mg by mouth 2 (two) times daily.  . ranolazine (RANEXA) 500 MG 12 hr tablet Take 500 mg by mouth 2 (two) times daily.  . [DISCONTINUED] levETIRAcetam (KEPPRA) 500 MG tablet Take 500 mg by mouth 2 (two) times daily.   No facility-administered encounter medications on file as of 12/21/2016.    ALLERGIES: Allergies  Allergen Reactions  . Aspirin Swelling and Other (See Comments)    Reaction:  Facial swelling    VACCINATION STATUS: Immunization History  Administered Date(s) Administered  . Influenza,trivalent, recombinat, inj, PF 05/02/2015  . Tdap 01/18/2015    Diabetes  He presents for his follow-up diabetic visit. He has type 2 diabetes mellitus. Onset time: He was diagnosed the approximate age of 14 years. His disease course has been improving. There are no hypoglycemic associated symptoms. Pertinent negatives for hypoglycemia include no confusion, headaches, pallor or seizures. Associated symptoms include fatigue. Pertinent negatives for diabetes include no chest pain, no polyphagia and no weakness. There are no hypoglycemic complications. Symptoms are improving. Diabetic complications include heart disease. (He is seriously noncompliant patient. He is now diagnosed with coronary  artery disease at [facility] since last visit. He is awaiting for his outpatient cardiology evaluation. This assay to be set for 03/24/2016.) Risk factors for coronary artery disease include diabetes mellitus, dyslipidemia, hypertension, male sex, tobacco exposure and sedentary lifestyle. Current diabetic treatment includes insulin injections. He is compliant with treatment none of the time. His weight is increasing steadily. He is following a generally unhealthy diet. When asked about meal planning, he reported none. He has had a previous visit with a dietitian. He never participates in exercise. Home blood sugar record trend: He is logs are significant for  better but persistent hyperglycemia, average for the last 7 days 18 readings is >200. (He did not bring any meter nor logs to review today however his A1c has improved dramatically to 6.1%, review of his labs did  not show any anemia.) An ACE inhibitor/angiotensin II receptor blocker is not being taken.  Hyperlipidemia  This is a chronic problem. The current episode started more than 1 year ago. Exacerbating diseases include diabetes. Pertinent negatives include no chest pain, myalgias or shortness of breath. Current antihyperlipidemic treatment includes statins. Risk factors for coronary artery disease include diabetes mellitus, dyslipidemia, hypertension, a sedentary lifestyle and male sex.  Hypertension  Pertinent negatives include no chest pain, headaches, neck pain, palpitations or shortness of breath. Risk factors for coronary artery disease include smoking/tobacco exposure, diabetes mellitus, dyslipidemia and male gender.     Review of Systems  Constitutional: Positive for fatigue. Negative for unexpected weight change.  HENT: Negative for dental problem, mouth sores and trouble swallowing.   Eyes: Negative for visual disturbance.  Respiratory: Negative for cough, choking, chest tightness, shortness of breath and wheezing.    Cardiovascular: Negative for chest pain, palpitations and leg swelling.  Gastrointestinal: Negative for abdominal distention, abdominal pain, constipation, diarrhea, nausea and vomiting.  Endocrine: Negative for polyphagia.  Genitourinary: Negative for dysuria, flank pain, hematuria and urgency.  Musculoskeletal: Negative for back pain, gait problem, myalgias and neck pain.  Skin: Negative for pallor, rash and wound.  Neurological: Negative for seizures, syncope, weakness, numbness and headaches.  Psychiatric/Behavioral: Negative.  Negative for confusion and dysphoric mood.    Objective:    BP 139/80   Pulse 76   Ht 5\' 7"  (1.702 m)   Wt 157 lb (71.2 kg)   BMI 24.59 kg/m   Wt Readings from Last 3 Encounters:  12/21/16 157 lb (71.2 kg)  10/19/16 140 lb (63.5 kg)  09/03/16 148 lb (67.1 kg)    Physical Exam  Constitutional: He is oriented to person, place, and time. He appears well-developed and well-nourished. He is cooperative. No distress.  HENT:  Head: Normocephalic and atraumatic.  Eyes: EOM are normal.  Neck: Normal range of motion. Neck supple. No tracheal deviation present. No thyromegaly present.  Cardiovascular: Normal rate, S1 normal, S2 normal and normal heart sounds.  Exam reveals no gallop.   No murmur heard. Pulses:      Dorsalis pedis pulses are 1+ on the right side, and 1+ on the left side.       Posterior tibial pulses are 1+ on the right side, and 1+ on the left side.  Pulmonary/Chest: Breath sounds normal. No respiratory distress. He has no wheezes.  Abdominal: Soft. Bowel sounds are normal. He exhibits no distension. There is no tenderness. There is no guarding and no CVA tenderness.  Musculoskeletal: He exhibits no edema.       Right shoulder: He exhibits no swelling and no deformity.  Neurological: He is alert and oriented to person, place, and time. He has normal strength and normal reflexes. No cranial nerve deficit or sensory deficit. Gait normal.  Skin:  Skin is warm and dry. No rash noted. No cyanosis. Nails show no clubbing.  Psychiatric: His speech is normal. Cognition and memory are normal.  Noncompliant behavior.   Chemistry (most recent): Lab Results  Component Value Date   NA 140 12/18/2016   K 3.7 12/18/2016   CL 108 12/18/2016   CO2 24 12/18/2016   BUN 17 12/18/2016   CREATININE 0.96 12/18/2016   Diabetic Labs (most recent): Lab Results  Component Value Date   HGBA1C 6.1 (H) 11/17/2016   HGBA1C 10.4 (H) 03/24/2016   HGBA1C 10.3 (H) 01/18/2016   Lipid Panel     Component Value Date/Time  CHOL 120 03/22/2016 0853   TRIG 37 03/22/2016 0853   HDL 60 03/22/2016 0853   CHOLHDL 2.0 03/22/2016 0853   VLDL 7 03/22/2016 0853   LDLCALC 53 03/22/2016 0853    Assessment & Plan:   1. Uncontrolled type 2 diabetes mellitus with complication, with long-term current use of insulin ([place])  - diabetes is  complicated by Coronary artery disease since last visit, noncompliance and patient remains at a high risk for more acute and chronic complications of diabetes which include CAD, CVA, CKD, retinopathy, and neuropathy. These are all discussed in detail with the patient.  Patient came without  his meter nor logs . - His A1c has dramatically improved to 6.1% from 10.4% last visit. -  Recent labs reviewed.   - I have re-counseled the patient on diet management   by adopting a carbohydrate restricted / protein rich  Diet.  - Suggestion is made for patient to avoid simple carbohydrates   from their diet including Cakes , Desserts, Ice Cream,  Soda (  diet and regular) , Sweet Tea , Candies,  Chips, Cookies, Artificial Sweeteners,   and "Sugar-free" Products .  This will help patient to have stable blood glucose profile and potentially avoid unintended  Weight gain.  - Patient is advised to stick to a routine mealtimes to eat 3 meals  a day and avoid unnecessary snacks ( to snack only to correct hypoglycemia).  - The patient  has been   scheduled with Jearld Fenton, RDN, CDE for individualized DM education.  - I have approached patient with the following individualized plan to manage diabetes and patient reluctantly accepts .  - He is improving clinically, gaining some weight back which is a good development for him.   - He has not been consistently taking Lantus 30 units nightly, I advised him to lower it to 15 units and continue daily at bedtime associated with monitoring of blood glucose 2 times daily-before breakfast and at bedtime. - I advised him to hold off on NovoLog for now.   - He is not a good candidate for incretin therapy nor metformin/SG LT 2 inhibitors . - Patient specific target  for A1c; LDL, HDL, Triglycerides, and  Waist Circumference were discussed in detail.  2) BP/HTN: Uncontrolled. Continue current medications. 3)  Weight/Diet:  He has gained 17 pounds, a good development for him. CDE consult in progress, exercise, and carbohydrates information provided.  4) Medical noncompliance: He has significant medical history of noncompliance, I advised him to stay focused on self-care.  5) Chronic Care/Health Maintenance:  -Patient  Is on Statin medications and encouraged to continue to follow up with Ophthalmology, Podiatrist at least yearly or according to recommendations, and advised to quit smoking. I have recommended yearly flu vaccine and pneumonia vaccination at least every 5 years; moderate intensity exercise for up to 150 minutes weekly; and  sleep for at least 7 hours a day.  - 25 minutes of time was spent on the care of this patient , 50% of which was applied for counseling on diabetes complications and their preventions.  - I advised patient to maintain close follow up with Rosita Fire, MD for primary care needs.  Patient is asked to bring meter and  blood glucose logs during their next visit.   Follow up plan: -Return in about 3 months (around 03/23/2017) for follow up with pre-visit labs,  meter, and logs.  Glade Lloyd, MD Phone: [phone]  Fax: [phone]   12/21/2016, 10:12  AM

## 2017-01-04 ENCOUNTER — Encounter: Payer: Self-pay | Admitting: Diagnostic Neuroimaging

## 2017-01-04 ENCOUNTER — Ambulatory Visit (INDEPENDENT_AMBULATORY_CARE_PROVIDER_SITE_OTHER): Payer: Medicaid Other | Admitting: Diagnostic Neuroimaging

## 2017-01-04 VITALS — BP 164/101 | HR 72 | Ht 67.0 in | Wt 162.0 lb

## 2017-01-04 DIAGNOSIS — F3170 Bipolar disorder, currently in remission, most recent episode unspecified: Secondary | ICD-10-CM

## 2017-01-04 DIAGNOSIS — G40219 Localization-related (focal) (partial) symptomatic epilepsy and epileptic syndromes with complex partial seizures, intractable, without status epilepticus: Secondary | ICD-10-CM

## 2017-01-04 MED ORDER — DIVALPROEX SODIUM 500 MG PO DR TAB: 1500.0000 mg | DELAYED_RELEASE_TABLET | Freq: Two times a day (BID) | ORAL | 4 refills | Status: DC

## 2017-01-04 MED ORDER — LEVETIRACETAM 1000 MG PO TABS: 1000.0000 mg | ORAL_TABLET | Freq: Two times a day (BID) | ORAL | 4 refills | Status: DC

## 2017-01-04 NOTE — Patient Instructions (Signed)
-   continue depakote 1500mg  twice a day  - continue levetiracetam 1000mg  twice a day  - follow up with [facility] re: bipolar disorder
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
## 2017-01-16 ENCOUNTER — Emergency Department (HOSPITAL_COMMUNITY): Payer: Medicaid Other

## 2017-01-16 ENCOUNTER — Encounter (HOSPITAL_COMMUNITY): Payer: Self-pay | Admitting: Emergency Medicine

## 2017-01-16 ENCOUNTER — Emergency Department (HOSPITAL_COMMUNITY)
Admission: EM | Admit: 2017-01-16 | Discharge: 2017-01-16 | Disposition: A | Discharge status: Home / Self Care | Payer: Medicaid Other | Attending: Emergency Medicine | Admitting: Emergency Medicine

## 2017-01-16 DIAGNOSIS — F17210 Nicotine dependence, cigarettes, uncomplicated: Secondary | ICD-10-CM | POA: Insufficient documentation

## 2017-01-16 DIAGNOSIS — Z951 Presence of aortocoronary bypass graft: Secondary | ICD-10-CM | POA: Insufficient documentation

## 2017-01-16 DIAGNOSIS — E1151 Type 2 diabetes mellitus with diabetic peripheral angiopathy without gangrene: Secondary | ICD-10-CM | POA: Insufficient documentation

## 2017-01-16 DIAGNOSIS — I2510 Atherosclerotic heart disease of native coronary artery without angina pectoris: Secondary | ICD-10-CM | POA: Diagnosis not present

## 2017-01-16 DIAGNOSIS — Z794 Long term (current) use of insulin: Secondary | ICD-10-CM | POA: Diagnosis not present

## 2017-01-16 DIAGNOSIS — Z7902 Long term (current) use of antithrombotics/antiplatelets: Secondary | ICD-10-CM | POA: Insufficient documentation

## 2017-01-16 DIAGNOSIS — R079 Chest pain, unspecified: Secondary | ICD-10-CM | POA: Diagnosis present

## 2017-01-16 DIAGNOSIS — Z79899 Other long term (current) drug therapy: Secondary | ICD-10-CM | POA: Diagnosis not present

## 2017-01-16 DIAGNOSIS — I10 Essential (primary) hypertension: Secondary | ICD-10-CM | POA: Insufficient documentation

## 2017-01-16 LAB — BASIC METABOLIC PANEL
Anion gap: 8 (ref 5–15)
BUN: 14 mg/dL (ref 6–20)
CO2: 28 mmol/L (ref 22–32)
Calcium: 8.9 mg/dL (ref 8.9–10.3)
Chloride: 104 mmol/L (ref 101–111)
Creatinine, Ser: 1.17 mg/dL (ref 0.61–1.24)
GFR calc Af Amer: >60 mL/min (ref >60)
GFR calc non Af Amer: >60 mL/min (ref >60)
GLUCOSE: 94 mg/dL (ref 65–99)
POTASSIUM: 3.5 mmol/L (ref 3.5–5.1)
Sodium: 140 mmol/L (ref 135–145)

## 2017-01-16 LAB — CBC
HEMATOCRIT: 35.3 % — AB (ref 39.0–52.0)
HEMOGLOBIN: 12.5 g/dL — AB (ref 13.0–17.0)
MCH: 31.7 pg (ref 26.0–34.0)
MCHC: 35.4 g/dL (ref 30.0–36.0)
MCV: 89.6 fL (ref 78.0–100.0)
Platelets: 161 10*3/uL (ref 150–400)
RBC: 3.94 MIL/uL — ABNORMAL LOW (ref 4.22–5.81)
RDW: 13.2 % (ref 11.5–15.5)
WBC: 6.2 10*3/uL (ref 4.0–10.5)

## 2017-01-16 LAB — I-STAT TROPONIN, ED
Troponin i, poc: 0.01 ng/mL (ref 0.00–0.08)
Troponin i, poc: 0 ng/mL (ref 0.00–0.08)

## 2017-01-16 MED ORDER — FENTANYL CITRATE (PF) 100 MCG/2ML IJ SOLN
50.0000 ug | Freq: Once | INTRAMUSCULAR | Status: AC
  Administered 2017-01-16: 50 ug via INTRAVENOUS
  Filled 2017-01-16: qty 2

## 2017-01-16 MED ORDER — OXYCODONE-ACETAMINOPHEN 5-325 MG PO TABS: 1.0000 | ORAL_TABLET | ORAL | 0 refills | Status: DC | PRN

## 2017-01-16 NOTE — ED Triage Notes (Addendum)
Pt reports chest pain and shortness of breath upon waking up this am. Pt reports had open heart surgery September 2017. Pt alert mildly diaphoretic. Pt reports pain is worse with movement/deep breath. Pt reports took nitro x2 PTA with no relief.

## 2017-01-16 NOTE — Discharge Instructions (Signed)
Tests showed no life-threatening condition. Prescription for pain medicine.  Follow-up your primary care doctor or return if worse.

## 2017-01-16 NOTE — ED Notes (Signed)
Pt reports chest pain returned. Pt reports left sided chest pain that is worse with inspiration and movement. EDP aware and given verbal order to repeat 32mcg of fentanyl IV x1 dose.

## 2017-01-18 NOTE — ED Provider Notes (Signed)
[facility] DEPT Provider Note   CSN: [id number] Arrival date & time: 01/16/17  1004     History   Chief Complaint Chief Complaint  Patient presents with  . Chest Pain    HPI Melvin Taylor is a 51 y.o. male.  Patient presents with chest pain worse with inspiration or movement since earlier today described as stabbing. 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 He tried nitroglycerin 2 tablets with minimal relief. No dyspnea, diaphoresis, nausea. Severity of symptoms is mild to moderate. Past medical history reviewed.      Past Medical History:  Diagnosis Date  . Anxiety   . Bipolar 1 disorder ([place])   . Chest pain   . Chronic back pain   . Chronic chest wall pain   . Coronary artery disease   . Depression   . Diabetes mellitus    Type II  . Hallucinations    "long history of them"  . Headache(784.0)   . History of gout   . HTN (hypertension)   . Insomnia   . Neuropathy   . Pain management   . Right leg pain    chronic  . Schizophrenia ([place])   . Seizures ([place])    last sz between July 5-9th, 2016; epilepsy  . Shortness of breath dyspnea    with exertion  . Sleep apnea   . Transfusion of blood product refused for religious reason     Patient Active Problem List   Diagnosis Date Noted  . CAD (coronary artery disease) 03/28/2016  . Chest pain 03/21/2016  . Hyperlipidemia 03/11/2016  . Ischemic chest pain   . NSTEMI (non-ST elevated myocardial infarction) ([place])   . Coronary atherosclerosis of native coronary artery 03/03/2016  . Cervical spinal stenosis 12/14/2015  . Loss of weight 08/25/2015  . Essential hypertension, benign 07/23/2015  . Personal history of noncompliance with medical treatment, presenting hazards to health 07/23/2015  . Abdominal pain 06/08/2015  . Constipation 06/08/2015  . History of colonic polyps   . Diverticulosis of colon without hemorrhage   . Mucosal abnormality of stomach   . Encounter for screening colonoscopy 04/16/2015  .  Dysphagia 04/16/2015  . Partial symptomatic epilepsy with complex partial seizures, intractable, without status epilepticus ([place]) 11/10/2014  . DM type 2 causing vascular disease ([place]) 06/29/2014  . Musculoskeletal pain 06/29/2014  . Hyperglycemia without ketosis 09/07/2013  . 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 Knee pain 12/28/2010  . Medial meniscus, posterior horn derangement 12/28/2010    Past Surgical History:  Procedure Laterality Date  . ANTERIOR CERVICAL DECOMP/DISCECTOMY FUSION N/A 12/14/2015   Procedure: ANTERIOR CERVICAL DECOMPRESSION/DISCECTOMY FUSION CERVICAL FIVE -SIX;  Surgeon: Earnie Larsson, MD;  Location: [facility];  Service: Neurosurgery;  Laterality: N/A;  . BIOPSY  05/07/2015   Procedure: BIOPSY (Gastric);  Surgeon: [name], MD;  Location: [facility];  Service: Endoscopy;;  . CARDIAC CATHETERIZATION N/A 03/07/2016   Procedure: Left Heart Cath and Coronary Angiography;  Surgeon: Belva Crome, MD;  Location: [facility];  Service: Cardiovascular;  Laterality: N/A;  . COLONOSCOPY WITH PROPOFOL N/A 05/07/2015   [id number] diverticulosis, multiple colon polyps removed, tubular adenoma, serrated colon polyp. Next colonoscopy October 2019  . CORONARY ARTERY BYPASS GRAFT N/A 03/28/2016   Procedure: CORONARY ARTERY BYPASS GRAFTING (CABG) x 5 USING GREATER SAPHENOUS VEIN;  Surgeon: [name], MD;  Location: [facility];  Service: Open Heart Surgery;  Laterality: N/A;  . ENDOVEIN HARVEST OF GREATER SAPHENOUS VEIN  Right 03/28/2016   Procedure: ENDOVEIN HARVEST OF GREATER SAPHENOUS VEIN;  Surgeon: [name], MD;  Location: [facility];  Service: Open Heart Surgery;  Laterality: Right;  . ESOPHAGEAL DILATION N/A 05/07/2015   Procedure: ESOPHAGEAL DILATION WITH 56FR MALONEY DILATOR;  Surgeon: [name], MD;  Location: [facility];  Service: Endoscopy;  Laterality: N/A;  . ESOPHAGOGASTRODUODENOSCOPY (EGD) WITH PROPOFOL N/A 05/07/2015   RMR: Status post dilation of  normal esophagus. Gastritis.  Marland Kitchen KNEE SURGERY Left    arthroscopy  . MANDIBLE FRACTURE SURGERY    . POLYPECTOMY  05/07/2015   Procedure: POLYPECTOMY (Hepatic Flexure, Distal Transverse Colon, Rectal);  Surgeon: [name], MD;  Location: [facility];  Service: Endoscopy;;  . TEE WITHOUT CARDIOVERSION N/A 03/28/2016   Procedure: TRANSESOPHAGEAL ECHOCARDIOGRAM (TEE);  Surgeon: [name], MD;  Location: [facility];  Service: Open Heart Surgery;  Laterality: N/A;  . TONSILLECTOMY         Home Medications    Prior to Admission medications   Medication Sig Start Date End Date Taking? Authorizing Provider  amLODipine (NORVASC) 10 MG tablet Take 1 tablet (10 mg total) by mouth daily. 04/18/16  Yes Lendon Colonel, NP  clopidogrel (PLAVIX) 75 MG tablet Take 1 tablet (75 mg total) by mouth daily. 04/18/16  Yes Lendon Colonel, NP  divalproex (DEPAKOTE) 500 MG DR tablet Take 3 tablets (1,500 mg total) by mouth 2 (two) times daily. 01/04/17  Yes Penumalli, Earlean Polka, MD  Doxepin HCl 5 % CREA Apply 2 g topically 3 (three) times daily. Pt applies to chest and legs.   Yes [provider]  furosemide (LASIX) 40 MG tablet Take 1 tablet (40 mg total) by mouth daily. 04/11/16  Yes [name], MD  insulin glargine (LANTUS) 100 unit/mL SOPN Inject 15 Units into the skin at bedtime.   Yes [provider]  isosorbide mononitrate (IMDUR) 60 MG 24 hr tablet Take 90 mg by mouth daily.   Yes [provider]  levETIRAcetam (KEPPRA) 1000 MG tablet Take 1 tablet (1,000 mg total) by mouth 2 (two) times daily. 01/04/17  Yes Penumalli, Vikram R, MD  lidocaine (LIDODERM) 5 % Place 1 patch onto the skin daily. Remove & Discard patch within 12 hours or as directed by MD Patient taking differently: Place 1 patch onto the skin daily as needed (for pain). Remove & Discard patch within 12 hours or as directed by MD 10/08/16  Yes Leaphart, Zack Seal, PA-C  lidocaine (XYLOCAINE) 5 % ointment Apply 2-3  application topically 4 (four) times daily. Pt applies to chest and legs.   Yes [provider]  metoprolol tartrate (LOPRESSOR) 25 MG tablet Take 25 mg by mouth 2 (two) times daily.   Yes [provider]  ranolazine (RANEXA) 500 MG 12 hr tablet Take 500 mg by mouth 2 (two) times daily.   Yes [provider]  oxyCODONE-acetaminophen (PERCOCET/ROXICET) 5-325 MG tablet Take 1-2 tablets by mouth every 4 (four) hours as needed for severe pain. 01/16/17   Nat Christen, MD    Family History Family History  Problem Relation Age of Onset  . Diabetes Father   . Hypertension Father   . Arthritis Unknown   . Diabetes Unknown   . Asthma Unknown   . Stroke Sister     Social History Social History  Substance Use Topics  . Smoking status: Current Every Day Smoker    Packs/day: 0.25    Years: 30.00  Types: Cigarettes  . Smokeless tobacco: Never Used     Comment: 3 cigs per day  . Alcohol use No     Allergies   Aspirin   Review of Systems Review of Systems  All other systems reviewed and are negative.    Physical Exam Updated Vital Signs BP (!) 169/108   Pulse (!) 55   Temp 98.1 F (36.7 C) (Oral)   Resp 16   Ht 5\' 7"  (1.702 m)   Wt 73.5 kg (162 lb)   SpO2 97%   BMI 25.37 kg/m   Physical Exam  Constitutional: He is oriented to person, place, and time. He appears well-developed and well-nourished.  HENT:  Head: Normocephalic and atraumatic.  Eyes: Conjunctivae are normal.  Neck: Neck supple.  Cardiovascular: Normal rate and regular rhythm.   Pulmonary/Chest: Effort normal and breath sounds normal.  Abdominal: Soft. Bowel sounds are normal.  Musculoskeletal: Normal range of motion.  Neurological: He is alert and oriented to person, place, and time.  Skin: Skin is warm and dry.  Psychiatric: He has a normal mood and affect. His behavior is normal.  Nursing note and vitals reviewed.    ED Treatments / Results  Labs (all labs ordered are  listed, but only abnormal results are displayed) Labs Reviewed  CBC - Abnormal; Notable for the following:       Result Value   RBC 3.94 (*)    Hemoglobin 12.5 (*)    HCT 35.3 (*)    All other components within normal limits  BASIC METABOLIC PANEL  I-STAT TROPOININ, ED  I-STAT TROPOININ, ED    EKG  EKG Interpretation  Date/Time:  Monday January 16 2017 10:13:08 EDT Ventricular Rate:  73 PR Interval:    QRS Duration: 76 QT Interval:  397 QTC Calculation: 438 R Axis:   -45 Text Interpretation:  Sinus rhythm Left axis deviation Probable anteroseptal infarct, old Confirmed by Nat Christen [phone]) on 01/16/2017 10:35:53 AM       Radiology No results found.  Procedures Procedures (including critical care time)  Medications Ordered in ED Medications  fentaNYL (SUBLIMAZE) injection 50 mcg (50 mcg Intravenous Given 01/16/17 1125)  fentaNYL (SUBLIMAZE) injection 50 mcg (50 mcg Intravenous Given 01/16/17 1312)     Initial Impression / Assessment and Plan / ED Course  I have reviewed the triage vital signs and the nursing notes.  Pertinent labs & imaging results that were available during my care of the patient were reviewed by me and considered in my medical decision making (see chart for details).     Workup including EKG, chest x-ray, troponin 2 negative. Family reports normal color and behavior. Will discharge to follow-up with primary care or return if worse.  Final Clinical Impressions(s) / ED Diagnoses   Final diagnoses:  Chest pain, unspecified type    New Prescriptions Discharge Medication List as of 01/16/2017  3:58 PM    START taking these medications   Details  oxyCODONE-acetaminophen (PERCOCET/ROXICET) 5-325 MG tablet Take 1-2 tablets by mouth every 4 (four) hours as needed for severe pain., Starting Mon 01/16/2017, Print         Nat Christen, MD 01/18/17 [phone]

## 2017-01-24 ENCOUNTER — Other Ambulatory Visit: Payer: Self-pay

## 2017-01-24 ENCOUNTER — Emergency Department (HOSPITAL_COMMUNITY): Payer: Medicaid Other

## 2017-01-24 ENCOUNTER — Encounter (HOSPITAL_COMMUNITY): Payer: Self-pay | Admitting: Emergency Medicine

## 2017-01-24 ENCOUNTER — Emergency Department (HOSPITAL_COMMUNITY)
Admission: EM | Admit: 2017-01-24 | Discharge: 2017-01-24 | Disposition: A | Discharge status: Home / Self Care | Payer: Medicaid Other | Attending: Emergency Medicine | Admitting: Emergency Medicine

## 2017-01-24 DIAGNOSIS — R079 Chest pain, unspecified: Secondary | ICD-10-CM | POA: Diagnosis not present

## 2017-01-24 DIAGNOSIS — I2510 Atherosclerotic heart disease of native coronary artery without angina pectoris: Secondary | ICD-10-CM | POA: Insufficient documentation

## 2017-01-24 DIAGNOSIS — E119 Type 2 diabetes mellitus without complications: Secondary | ICD-10-CM | POA: Diagnosis not present

## 2017-01-24 DIAGNOSIS — I10 Essential (primary) hypertension: Secondary | ICD-10-CM | POA: Diagnosis not present

## 2017-01-24 DIAGNOSIS — F17210 Nicotine dependence, cigarettes, uncomplicated: Secondary | ICD-10-CM | POA: Insufficient documentation

## 2017-01-24 LAB — CBC
HEMATOCRIT: 35.6 % — AB (ref 39.0–52.0)
HEMOGLOBIN: 12.5 g/dL — AB (ref 13.0–17.0)
MCH: 31.7 pg (ref 26.0–34.0)
MCHC: 35.1 g/dL (ref 30.0–36.0)
MCV: 90.4 fL (ref 78.0–100.0)
Platelets: 161 10*3/uL (ref 150–400)
RBC: 3.94 MIL/uL — ABNORMAL LOW (ref 4.22–5.81)
RDW: 13.2 % (ref 11.5–15.5)
WBC: 5.1 10*3/uL (ref 4.0–10.5)

## 2017-01-24 LAB — BASIC METABOLIC PANEL
ANION GAP: 6 (ref 5–15)
BUN: 15 mg/dL (ref 6–20)
CHLORIDE: 102 mmol/L (ref 101–111)
CO2: 29 mmol/L (ref 22–32)
Calcium: 8.6 mg/dL — ABNORMAL LOW (ref 8.9–10.3)
Creatinine, Ser: 1.21 mg/dL (ref 0.61–1.24)
GFR calc Af Amer: >60 mL/min (ref >60)
Glucose, Bld: 101 mg/dL — ABNORMAL HIGH (ref 65–99)
POTASSIUM: 3.7 mmol/L (ref 3.5–5.1)
SODIUM: 137 mmol/L (ref 135–145)

## 2017-01-24 LAB — TROPONIN I: Troponin I: <0.03 ng/mL (ref <0.03)

## 2017-01-24 NOTE — ED Notes (Signed)
Pt left through the ED entrance with steady gait.

## 2017-01-24 NOTE — ED Triage Notes (Signed)
Pt reports chest pain around open heart incision and on left side of chest all day today.  States his doctor gives him a cream for pain but it is not helping.

## 2017-01-25 ENCOUNTER — Other Ambulatory Visit: Payer: Self-pay

## 2017-01-25 ENCOUNTER — Encounter (HOSPITAL_COMMUNITY): Payer: Self-pay | Admitting: Emergency Medicine

## 2017-01-25 ENCOUNTER — Emergency Department (HOSPITAL_COMMUNITY)
Admission: EM | Admit: 2017-01-25 | Discharge: 2017-01-25 | Disposition: A | Discharge status: Home / Self Care | Payer: Medicaid Other | Attending: Emergency Medicine | Admitting: Emergency Medicine

## 2017-01-25 DIAGNOSIS — E119 Type 2 diabetes mellitus without complications: Secondary | ICD-10-CM | POA: Diagnosis not present

## 2017-01-25 DIAGNOSIS — F17210 Nicotine dependence, cigarettes, uncomplicated: Secondary | ICD-10-CM | POA: Diagnosis not present

## 2017-01-25 DIAGNOSIS — Z794 Long term (current) use of insulin: Secondary | ICD-10-CM | POA: Insufficient documentation

## 2017-01-25 DIAGNOSIS — R0789 Other chest pain: Secondary | ICD-10-CM

## 2017-01-25 DIAGNOSIS — Z79899 Other long term (current) drug therapy: Secondary | ICD-10-CM | POA: Insufficient documentation

## 2017-01-25 DIAGNOSIS — Z7902 Long term (current) use of antithrombotics/antiplatelets: Secondary | ICD-10-CM | POA: Insufficient documentation

## 2017-01-25 DIAGNOSIS — I119 Hypertensive heart disease without heart failure: Secondary | ICD-10-CM | POA: Diagnosis not present

## 2017-01-25 DIAGNOSIS — I2510 Atherosclerotic heart disease of native coronary artery without angina pectoris: Secondary | ICD-10-CM | POA: Diagnosis not present

## 2017-01-25 LAB — BASIC METABOLIC PANEL
Anion gap: 7 (ref 5–15)
BUN: 15 mg/dL (ref 6–20)
CALCIUM: 9.2 mg/dL (ref 8.9–10.3)
CO2: 28 mmol/L (ref 22–32)
Chloride: 103 mmol/L (ref 101–111)
Creatinine, Ser: 1.24 mg/dL (ref 0.61–1.24)
GFR calc Af Amer: >60 mL/min (ref >60)
GLUCOSE: 94 mg/dL (ref 65–99)
Potassium: 3.9 mmol/L (ref 3.5–5.1)
Sodium: 138 mmol/L (ref 135–145)

## 2017-01-25 LAB — CBC WITH DIFFERENTIAL/PLATELET
Basophils Absolute: 0 10*3/uL (ref 0.0–0.1)
Basophils Relative: 0 %
EOS PCT: 3 %
Eosinophils Absolute: 0.2 10*3/uL (ref 0.0–0.7)
HEMATOCRIT: 37.9 % — AB (ref 39.0–52.0)
Hemoglobin: 13.6 g/dL (ref 13.0–17.0)
LYMPHS ABS: 3.2 10*3/uL (ref 0.7–4.0)
LYMPHS PCT: 54 %
MCH: 31.9 pg (ref 26.0–34.0)
MCHC: 35.9 g/dL (ref 30.0–36.0)
MCV: 88.8 fL (ref 78.0–100.0)
MONO ABS: 0.5 10*3/uL (ref 0.1–1.0)
MONOS PCT: 8 %
Neutro Abs: 2.1 10*3/uL (ref 1.7–7.7)
Neutrophils Relative %: 35 %
Platelets: 164 10*3/uL (ref 150–400)
RBC: 4.27 MIL/uL (ref 4.22–5.81)
RDW: 13.2 % (ref 11.5–15.5)
WBC: 6 10*3/uL (ref 4.0–10.5)

## 2017-01-25 LAB — TROPONIN I

## 2017-01-25 LAB — I-STAT TROPONIN, ED: Troponin i, poc: 0 ng/mL (ref 0.00–0.08)

## 2017-01-25 MED ORDER — MORPHINE SULFATE (PF) 4 MG/ML IV SOLN
4.0000 mg | Freq: Once | INTRAVENOUS | Status: AC
  Administered 2017-01-25: 4 mg via INTRAVENOUS
  Filled 2017-01-25: qty 1

## 2017-01-25 MED ORDER — LIDOCAINE 5 % EX PTCH
1.0000 | MEDICATED_PATCH | Freq: Once | CUTANEOUS | Status: DC
  Administered 2017-01-25: 1 via TRANSDERMAL
  Filled 2017-01-25: qty 1

## 2017-01-25 NOTE — ED Notes (Signed)
Pt states," I have been here 3 hours and no doctor has been in to see me, I will give them 20 more minutes and if they arent in here then I am leaving." Reassured pt that MD will be in with him soon.

## 2017-01-25 NOTE — ED Triage Notes (Signed)
Pt here yesterday but never saw edp due to long wait. Pt states chest hurts every time it rains. Took ntg this am with no relief. Nad.

## 2017-01-25 NOTE — ED Notes (Signed)
Pt for the third time has taken off EKG leads, pt told to keep them on for monitoring. Pt reports "i gotta go, I want to go home."

## 2017-01-25 NOTE — Discharge Instructions (Signed)
Please follow-up with your PCP for ongoing pain management.  Return for worsening symptoms, including passing out, difficulty breathing, escalating pain or any other symptoms concerning to you.

## 2017-01-25 NOTE — ED Provider Notes (Signed)
[facility] DEPT Provider Note   CSN: [id number] Arrival date & time: 01/25/17  0919     History   Chief Complaint Chief Complaint  Patient presents with  . Chest Pain    HPI AZHAR KNOPE is a 51 y.o. male.  The history is provided by the patient.  Chest Pain   This is a recurrent problem. The current episode started yesterday. The problem occurs constantly. The problem has not changed since onset.The pain is associated with movement. The pain is present in the lateral region and substernal region. The pain is moderate. The quality of the pain is described as sharp. The pain does not radiate. Duration of episode(s) is 1 day. The symptoms are aggravated by certain positions. Associated symptoms include shortness of breath. Pertinent negatives include no abdominal pain, no back pain, no cough, no diaphoresis, no leg pain, no lower extremity edema, no nausea, no near-syncope, no numbness, no orthopnea and no PND. He has tried nitroglycerin for the symptoms. The treatment provided no relief. Risk factors include male gender and smoking/tobacco exposure.  His past medical history is significant for CAD.   51 year old male who presents with chest pain. He has a history of CAD status post CABG, diabetes, chronic chest wall pain, bipolar disorder and schizophrenia. Reports chest pain that started yesterday morning when he woke up from sleep that has been constant and unremitting. Pain sharp, worse with movement and palpation. Associated with mild shortness of breath. States that this is similar to previous chest pain that he has been seen in the ED. Taken 2 doses of nitroglycerin, without any resolution. States that he has been using lidocaine lotion, which briefly helps the pain, but pain shortly returns. No nausea, vomiting, diaphoresis, leg swelling or leg pain, syncope or near syncope. No cough or fever.  Past Medical History:  Diagnosis Date  . Anxiety   . Bipolar 1 disorder ([place])     . Chest pain   . Chronic back pain   . Chronic chest wall pain   . Coronary artery disease   . Depression   . Diabetes mellitus    Type II  . Hallucinations    "long history of them"  . Headache(784.0)   . History of gout   . HTN (hypertension)   . Insomnia   . Neuropathy   . Pain management   . Right leg pain    chronic  . Schizophrenia ([place])   . Seizures ([place])    last sz between July 5-9th, 2016; epilepsy  . Shortness of breath dyspnea    with exertion  . Sleep apnea   . Transfusion of blood product refused for religious reason     Patient Active Problem List   Diagnosis Date Noted  . CAD (coronary artery disease) 03/28/2016  . Chest pain 03/21/2016  . Hyperlipidemia 03/11/2016  . Ischemic chest pain   . NSTEMI (non-ST elevated myocardial infarction) ([place])   . Coronary atherosclerosis of native coronary artery 03/03/2016  . Cervical spinal stenosis 12/14/2015  . Loss of weight 08/25/2015  . Essential hypertension, benign 07/23/2015  . Personal history of noncompliance with medical treatment, presenting hazards to health 07/23/2015  . Abdominal pain 06/08/2015  . Constipation 06/08/2015  . History of colonic polyps   . Diverticulosis of colon without hemorrhage   . Mucosal abnormality of stomach   . Encounter for screening colonoscopy 04/16/2015  . Dysphagia 04/16/2015  . Partial symptomatic epilepsy with complex partial seizures, intractable, without status epilepticus (  [place]) 11/10/2014  . DM type 2 causing vascular disease ([place]) 06/29/2014  . Musculoskeletal pain 06/29/2014  . Hyperglycemia without ketosis 09/07/2013  . 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 Knee pain 12/28/2010  . Medial meniscus, posterior horn derangement 12/28/2010    Past Surgical History:  Procedure Laterality Date  . ANTERIOR CERVICAL DECOMP/DISCECTOMY FUSION N/A 12/14/2015   Procedure: ANTERIOR CERVICAL DECOMPRESSION/DISCECTOMY FUSION CERVICAL FIVE -SIX;  Surgeon:  Earnie Larsson, MD;  Location: [facility];  Service: Neurosurgery;  Laterality: N/A;  . BIOPSY  05/07/2015   Procedure: BIOPSY (Gastric);  Surgeon: [name], MD;  Location: [facility];  Service: Endoscopy;;  . CARDIAC CATHETERIZATION N/A 03/07/2016   Procedure: Left Heart Cath and Coronary Angiography;  Surgeon: Belva Crome, MD;  Location: [facility];  Service: Cardiovascular;  Laterality: N/A;  . COLONOSCOPY WITH PROPOFOL N/A 05/07/2015   [id number] diverticulosis, multiple colon polyps removed, tubular adenoma, serrated colon polyp. Next colonoscopy October 2019  . CORONARY ARTERY BYPASS GRAFT N/A 03/28/2016   Procedure: CORONARY ARTERY BYPASS GRAFTING (CABG) x 5 USING GREATER SAPHENOUS VEIN;  Surgeon: [name], MD;  Location: [facility];  Service: Open Heart Surgery;  Laterality: N/A;  . ENDOVEIN HARVEST OF GREATER SAPHENOUS VEIN Right 03/28/2016   Procedure: ENDOVEIN HARVEST OF GREATER SAPHENOUS VEIN;  Surgeon: [name], MD;  Location: [facility];  Service: Open Heart Surgery;  Laterality: Right;  . ESOPHAGEAL DILATION N/A 05/07/2015   Procedure: ESOPHAGEAL DILATION WITH 56FR MALONEY DILATOR;  Surgeon: [name], MD;  [facility];  Service: Endoscopy;  Laterality: N/A;  . ESOPHAGOGASTRODUODENOSCOPY (EGD) WITH PROPOFOL N/A 05/07/2015   RMR: Status post dilation of normal esophagus. Gastritis.  Marland Kitchen KNEE SURGERY Left    arthroscopy  . MANDIBLE FRACTURE SURGERY    . POLYPECTOMY  05/07/2015   Procedure: POLYPECTOMY (Hepatic Flexure, Distal Transverse Colon, Rectal);  Surgeon: [name], MD;  Location[facility];  Service: Endoscopy;;  . TEE WITHOUT CARDIOVERSION N/A 03/28/2016   Procedure: TRANSESOPHAGEAL ECHOCARDIOGRAM (TEE);  Surgeon: [name], MD;  Location: [facility];  Service: Open Heart Surgery;  Laterality: N/A;  . TONSILLECTOMY         Home Medications    Prior to Admission medications   Medication Sig Start Date End Date Taking? Authorizing Provider    amLODipine (NORVASC) 10 MG tablet Take 1 tablet (10 mg total) by mouth daily. 04/18/16  Yes Lendon Colonel, NP  clopidogrel (PLAVIX) 75 MG tablet Take 1 tablet (75 mg total) by mouth daily. 04/18/16  Yes Lendon Colonel, NP  divalproex (DEPAKOTE) 500 MG DR tablet Take 3 tablets (1,500 mg total) by mouth 2 (two) times daily. 01/04/17  Yes Penumalli, Earlean Polka, MD  Doxepin HCl 5 % CREA Apply 2 g topically 3 (three) times daily. Pt applies to chest and legs.   Yes [provider]  furosemide (LASIX) 40 MG tablet Take 1 tablet (40 mg total) by mouth daily. 04/11/16  Yes [name], MD  insulin glargine (LANTUS) 100 unit/mL SOPN Inject 15 Units into the skin at bedtime.   Yes [provider]  isosorbide mononitrate (IMDUR) 60 MG 24 hr tablet Take 90 mg by mouth daily.   Yes [provider]  levETIRAcetam (KEPPRA) 1000 MG tablet Take 1 tablet (1,000 mg total) by mouth 2 (two) times daily. 01/04/17  Yes Penumalli, Vikram R, MD  lidocaine (XYLOCAINE) 5 % ointment Apply 2-3 application topically 4 (four)  times daily. Pt applies to chest and legs.   Yes [provider]  metoprolol tartrate (LOPRESSOR) 25 MG tablet Take 25 mg by mouth 2 (two) times daily.   Yes [provider]  ranolazine (RANEXA) 500 MG 12 hr tablet Take 500 mg by mouth 2 (two) times daily.   Yes [provider]  lidocaine (LIDODERM) 5 % Place 1 patch onto the skin daily. Remove & Discard patch within 12 hours or as directed by MD Patient taking differently: Place 1 patch onto the skin daily as needed (for pain). Remove & Discard patch within 12 hours or as directed by MD 10/08/16   Doristine Devoid, PA-C  oxyCODONE-acetaminophen (PERCOCET/ROXICET) 5-325 MG tablet Take 1-2 tablets by mouth every 4 (four) hours as needed for severe pain. 01/16/17   Nat Christen, MD    Family History Family History  Problem Relation Age of Onset  . Diabetes Father   . Hypertension Father   .  Arthritis Unknown   . Diabetes Unknown   . Asthma Unknown   . Stroke Sister     Social History Social History  Substance Use Topics  . Smoking status: Current Every Day Smoker    Packs/day: 0.25    Years: 30.00    Types: Cigarettes  . Smokeless tobacco: Never Used     Comment: 3 cigs per day  . Alcohol use No     Allergies   Aspirin   Review of Systems Review of Systems  Constitutional: Negative for diaphoresis.  Respiratory: Positive for shortness of breath. Negative for cough.   Cardiovascular: Positive for chest pain. Negative for orthopnea, PND and near-syncope.  Gastrointestinal: Negative for abdominal pain and nausea.  Musculoskeletal: Negative for back pain.  Allergic/Immunologic: Negative for immunocompromised state.  Neurological: Negative for numbness.  Hematological: Does not bruise/bleed easily.  All other systems reviewed and are negative.    Physical Exam Updated Vital Signs BP (!) 169/104   Pulse 62   Temp 98.1 F (36.7 C) (Oral)   Resp 16   SpO2 100%   Physical Exam Physical Exam  Nursing note and vitals reviewed. Constitutional: Well developed, well nourished, non-toxic, and in no acute distress Head: Normocephalic and atraumatic.  Mouth/Throat: Oropharynx is clear and moist.  Neck: Normal range of motion. Neck supple.  Cardiovascular: Normal rate and regular rhythm.   Pulmonary/Chest: Effort normal and breath sounds normal. Midline surgical scar. Tenderness to palpation over sternal surgical and left chest wall.  Abdominal: Soft. There is no tenderness. There is no rebound and no guarding.  Musculoskeletal: Normal range of motion. no calf tenderness or significant edema Neurological: Alert, no facial droop, fluent speech, moves all extremities symmetrically Skin: Skin is warm and dry.  Psychiatric: Cooperative   ED Treatments / Results  Labs (all labs ordered are listed, but only abnormal results are displayed) Labs Reviewed  CBC  WITH DIFFERENTIAL/PLATELET - Abnormal; Notable for the following:       Result Value   HCT 37.9 (*)    All other components within normal limits  BASIC METABOLIC PANEL  TROPONIN I  I-STAT TROPOININ, ED    EKG  EKG Interpretation None       Radiology Dg Chest 2 View  Result Date: 01/24/2017 CLINICAL DATA:  Mid to left sided chest pain, sob, productive cough, onset this AM. Pt stated he had bypass surgery in Sept. History of DM, HTN, coronary artery disease. EXAM: CHEST  2 VIEW COMPARISON:  01/16/2017 FINDINGS: There stable  changes from previous CABG surgery. Cardiac silhouette is borderline enlarged. No mediastinal or hilar masses. No evidence of adenopathy. Clear lungs.  No pleural effusion or pneumothorax. Stable changes from a prior anterior cervical spine fusion. Skeletal structures are otherwise unremarkable. IMPRESSION: No acute cardiopulmonary disease. Electronically Signed   By: Lajean Manes M.D.   On: 01/24/2017 16:36    Procedures Procedures (including critical care time)  Medications Ordered in ED Medications  lidocaine (LIDODERM) 5 % 1 patch (not administered)  morphine 4 MG/ML injection 4 mg (4 mg Intravenous Given 01/25/17 1230)     Initial Impression / Assessment and Plan / ED Course  I have reviewed the triage vital signs and the nursing notes.  Pertinent labs & imaging results that were available during my care of the patient were reviewed by me and considered in my medical decision making (see chart for details).     I reviewed the available records in [facility]. He has had multiple ED visits for similar chest wall MSK-type pain. He was in ED yesterday, but states he LWBS due to long wait. He did get CXR, which was visualized and shows no acute cardiopulmonary processes. He has troponin x 1 that was normal at 4:38PM yesterday.  Now with > 24 hours ongoing symptoms, with negative repeat troponin 20 hours later after initial troponin. Unlikely ACS, and pain seems very  atypical for that as well. Seems consistent with previous chest wall pain. He states he responded well to lidoderm patches in past, but unable to afford. Did apply lidoderm patch in ED. Requested narcotic medications as given last time in ED, but discussed he needed to see his PCP for that. Strict return and follow-up instructions reviewed. He expressed understanding of all discharge instructions and felt comfortable with the plan of care.     Final Clinical Impressions(s) / ED Diagnoses   Final diagnoses:  Chest wall pain  Atypical chest pain    New Prescriptions New Prescriptions   No medications on file     Forde Dandy, MD 01/25/17 1334

## 2017-01-25 NOTE — ED Notes (Signed)
Pt states that he has been having chest pain since yesterday.  Pt states that he has been short of breath oxygen saturation is 100 % at this time on room air.  Pt took nitroglycerin at home before presenting to ED.

## 2017-01-25 NOTE — ED Notes (Addendum)
Pt made aware to return if symptoms worsen or if any life threatening symptoms occur.  Dr. Oleta Mouse okay with d/c bp at this time.

## 2017-02-02 ENCOUNTER — Emergency Department (HOSPITAL_COMMUNITY)
Admission: EM | Admit: 2017-02-02 | Discharge: 2017-02-02 | Disposition: A | Discharge status: Home / Self Care | Payer: Medicaid Other | Attending: Emergency Medicine | Admitting: Emergency Medicine

## 2017-02-02 ENCOUNTER — Other Ambulatory Visit: Payer: Self-pay

## 2017-02-02 ENCOUNTER — Encounter (HOSPITAL_COMMUNITY): Payer: Self-pay

## 2017-02-02 ENCOUNTER — Telehealth: Payer: Self-pay | Admitting: Cardiovascular Disease

## 2017-02-02 ENCOUNTER — Emergency Department (HOSPITAL_COMMUNITY): Payer: Medicaid Other

## 2017-02-02 DIAGNOSIS — J069 Acute upper respiratory infection, unspecified: Secondary | ICD-10-CM

## 2017-02-02 DIAGNOSIS — R0789 Other chest pain: Secondary | ICD-10-CM | POA: Diagnosis not present

## 2017-02-02 DIAGNOSIS — I2510 Atherosclerotic heart disease of native coronary artery without angina pectoris: Secondary | ICD-10-CM | POA: Insufficient documentation

## 2017-02-02 DIAGNOSIS — I10 Essential (primary) hypertension: Secondary | ICD-10-CM | POA: Insufficient documentation

## 2017-02-02 DIAGNOSIS — F17210 Nicotine dependence, cigarettes, uncomplicated: Secondary | ICD-10-CM | POA: Insufficient documentation

## 2017-02-02 DIAGNOSIS — Z951 Presence of aortocoronary bypass graft: Secondary | ICD-10-CM | POA: Diagnosis not present

## 2017-02-02 DIAGNOSIS — R05 Cough: Secondary | ICD-10-CM | POA: Diagnosis not present

## 2017-02-02 DIAGNOSIS — Z7902 Long term (current) use of antithrombotics/antiplatelets: Secondary | ICD-10-CM | POA: Diagnosis not present

## 2017-02-02 DIAGNOSIS — R079 Chest pain, unspecified: Secondary | ICD-10-CM | POA: Diagnosis present

## 2017-02-02 DIAGNOSIS — Z79899 Other long term (current) drug therapy: Secondary | ICD-10-CM | POA: Diagnosis not present

## 2017-02-02 DIAGNOSIS — Z794 Long term (current) use of insulin: Secondary | ICD-10-CM | POA: Diagnosis not present

## 2017-02-02 DIAGNOSIS — E119 Type 2 diabetes mellitus without complications: Secondary | ICD-10-CM | POA: Diagnosis not present

## 2017-02-02 LAB — BASIC METABOLIC PANEL
ANION GAP: 6 (ref 5–15)
BUN: 15 mg/dL (ref 6–20)
CO2: 29 mmol/L (ref 22–32)
Calcium: 9 mg/dL (ref 8.9–10.3)
Chloride: 105 mmol/L (ref 101–111)
Creatinine, Ser: 1.13 mg/dL (ref 0.61–1.24)
GFR calc non Af Amer: >60 mL/min (ref >60)
GLUCOSE: 111 mg/dL — AB (ref 65–99)
Potassium: 3.6 mmol/L (ref 3.5–5.1)
Sodium: 140 mmol/L (ref 135–145)

## 2017-02-02 LAB — CBC
HEMATOCRIT: 38 % — AB (ref 39.0–52.0)
HEMOGLOBIN: 13.5 g/dL (ref 13.0–17.0)
MCH: 31.8 pg (ref 26.0–34.0)
MCHC: 35.5 g/dL (ref 30.0–36.0)
MCV: 89.6 fL (ref 78.0–100.0)
Platelets: 151 10*3/uL (ref 150–400)
RBC: 4.24 MIL/uL (ref 4.22–5.81)
RDW: 12.9 % (ref 11.5–15.5)
WBC: 5.7 10*3/uL (ref 4.0–10.5)

## 2017-02-02 LAB — TROPONIN I

## 2017-02-02 LAB — BRAIN NATRIURETIC PEPTIDE: B NATRIURETIC PEPTIDE 5: 48 pg/mL (ref 0.0–100.0)

## 2017-02-02 LAB — D-DIMER, QUANTITATIVE: D-Dimer, Quant: 0.44 ug/mL-FEU (ref 0.00–0.50)

## 2017-02-02 MED ORDER — BENZONATATE 100 MG PO CAPS: 100.0000 mg | ORAL_CAPSULE | Freq: Three times a day (TID) | ORAL | 0 refills | Status: DC | PRN

## 2017-02-02 MED ORDER — ALBUTEROL SULFATE (2.5 MG/3ML) 0.083% IN NEBU
5.0000 mg | INHALATION_SOLUTION | Freq: Once | RESPIRATORY_TRACT | Status: AC
  Administered 2017-02-02: 5 mg via RESPIRATORY_TRACT
  Filled 2017-02-02: qty 6

## 2017-02-02 MED ORDER — ALBUTEROL SULFATE HFA 108 (90 BASE) MCG/ACT IN AERS
1.0000 | INHALATION_SPRAY | RESPIRATORY_TRACT | Status: DC | PRN
  Administered 2017-02-02: 2 via RESPIRATORY_TRACT
  Filled 2017-02-02: qty 6.7

## 2017-02-02 MED ORDER — PREDNISONE 20 MG PO TABS: ORAL_TABLET | ORAL | 0 refills | Status: DC

## 2017-02-02 MED ORDER — TRAMADOL HCL 50 MG PO TABS: 50.0000 mg | ORAL_TABLET | Freq: Four times a day (QID) | ORAL | 0 refills | Status: DC | PRN

## 2017-02-02 NOTE — Telephone Encounter (Signed)
Don't know if have any fever.  Chest hurting x 2 days with cough productive with yellow sputum.  Weakness, just does not feel well.  Advised patient to see pmd today to address possible need for ABO.  Does have 6 mo f/u scheduled here on 02/06/2017 with Dr. Bronson Ing.  Patient verbalized understanding.

## 2017-02-02 NOTE — ED Notes (Signed)
Patient returned from X-ray 

## 2017-02-02 NOTE — ED Notes (Signed)
Patient transported to X-ray 

## 2017-02-02 NOTE — ED Triage Notes (Signed)
Pt reports that he has had a productive cough for 2 days. States chest has been since before he started coughing, but coughing has made pain worse. Sob with exertion

## 2017-02-02 NOTE — Telephone Encounter (Signed)
Patient has a really bad cold with a cough.  He would like to know what he can do about cold.   He stated he recently had heart surgery

## 2017-02-02 NOTE — Discharge Instructions (Addendum)
Take meds as directed. Follow-up with your doctors. Cardiac workup was negative.

## 2017-02-02 NOTE — ED Provider Notes (Signed)
[facility] DEPT Provider Note   CSN: [id number] Arrival date & time: 02/02/17  1055     History   Chief Complaint Chief Complaint  Patient presents with  . Cough  . Chest Pain    HPI Melvin Taylor is a 51 y.o. male.  HPI Patient presents with 2 days of constant central chest pain that he describes as sharp. Worse with movement and coughing. States his cough is worsening in the last 2 days as well. His productive of yellow sputum. No fever or chills. He has had increased shortness of breath especially with lying flat and exertion. He has chronic swelling to the right lower extremity which is unchanged. States he's been compliant with his medication. Past Medical History:  Diagnosis Date  . Anxiety   . Bipolar 1 disorder ([place])   . Chest pain   . Chronic back pain   . Chronic chest wall pain   . Coronary artery disease   . Depression   . Diabetes mellitus    Type II  . Hallucinations    "long history of them"  . Headache(784.0)   . History of gout   . HTN (hypertension)   . Insomnia   . Neuropathy   . Pain management   . Right leg pain    chronic  . Schizophrenia ([place])   . Seizures ([place])    last sz between July 5-9th, 2016; epilepsy  . Shortness of breath dyspnea    with exertion  . Sleep apnea   . Transfusion of blood product refused for religious reason     Patient Active Problem List   Diagnosis Date Noted  . CAD (coronary artery disease) 03/28/2016  . Chest pain 03/21/2016  . Hyperlipidemia 03/11/2016  . Ischemic chest pain   . NSTEMI (non-ST elevated myocardial infarction) ([place])   . Coronary atherosclerosis of native coronary artery 03/03/2016  . Cervical spinal stenosis 12/14/2015  . Loss of weight 08/25/2015  . Essential hypertension, benign 07/23/2015  . Personal history of noncompliance with medical treatment, presenting hazards to health 07/23/2015  . Abdominal pain 06/08/2015  . Constipation 06/08/2015  . History of colonic polyps   .  Diverticulosis of colon without hemorrhage   . Mucosal abnormality of stomach   . Encounter for screening colonoscopy 04/16/2015  . Dysphagia 04/16/2015  . Partial symptomatic epilepsy with complex partial seizures, intractable, without status epilepticus ([place]) 11/10/2014  . DM type 2 causing vascular disease ([name]) 06/29/2014  . Musculoskeletal pain 06/29/2014  . Hyperglycemia without ketosis 09/07/2013  . 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 Knee pain 12/28/2010  . Medial meniscus, posterior horn derangement 12/28/2010    Past Surgical History:  Procedure Laterality Date  . ANTERIOR CERVICAL DECOMP/DISCECTOMY FUSION N/A 12/14/2015   Procedure: ANTERIOR CERVICAL DECOMPRESSION/DISCECTOMY FUSION CERVICAL FIVE -SIX;  Surgeon: Earnie Larsson, MD;  Location: [facility];  Service: Neurosurgery;  Laterality: N/A;  . BIOPSY  05/07/2015   Procedure: BIOPSY (Gastric);  Surgeon: [name], MD;  Location: [facility];  Service: Endoscopy;;  . CARDIAC CATHETERIZATION N/A 03/07/2016   Procedure: Left Heart Cath and Coronary Angiography;  Surgeon: Belva Crome, MD;  Location: [facility];  Service: Cardiovascular;  Laterality: N/A;  . COLONOSCOPY WITH PROPOFOL N/A 05/07/2015   GLO:[id number] diverticulosis, multiple colon polyps removed, tubular adenoma, serrated colon polyp. Next colonoscopy October 2019  . CORONARY ARTERY BYPASS GRAFT N/A 03/28/2016   Procedure: CORONARY ARTERY BYPASS GRAFTING (CABG) x 5 USING GREATER SAPHENOUS  VEIN;  Surgeon: [name], MD;  Location: [facility];  Service: Open Heart Surgery;  Laterality: N/A;  . ENDOVEIN HARVEST OF GREATER SAPHENOUS VEIN Right 03/28/2016   Procedure: ENDOVEIN HARVEST OF GREATER SAPHENOUS VEIN;  Surgeon: [name], MD;  Location: [facility];  Service: Open Heart Surgery;  Laterality: Right;  . ESOPHAGEAL DILATION N/A 05/07/2015   Procedure: ESOPHAGEAL DILATION WITH 56FR MALONEY DILATOR;  Surgeon: [name], MD;  Location: [facility];   Service: Endoscopy;  Laterality: N/A;  . ESOPHAGOGASTRODUODENOSCOPY (EGD) WITH PROPOFOL N/A 05/07/2015   RMR: Status post dilation of normal esophagus. Gastritis.  Marland Kitchen KNEE SURGERY Left    arthroscopy  . MANDIBLE FRACTURE SURGERY    . POLYPECTOMY  05/07/2015   Procedure: POLYPECTOMY (Hepatic Flexure, Distal Transverse Colon, Rectal);  Surgeon: [name], MD;  Location: [facility];  Service: Endoscopy;;  . TEE WITHOUT CARDIOVERSION N/A 03/28/2016   Procedure: TRANSESOPHAGEAL ECHOCARDIOGRAM (TEE);  Surgeon: [name], MD;  Location: [facility];  Service: Open Heart Surgery;  Laterality: N/A;  . TONSILLECTOMY         Home Medications    Prior to Admission medications   Medication Sig Start Date End Date Taking? Authorizing Provider  amLODipine (NORVASC) 10 MG tablet Take 1 tablet (10 mg total) by mouth daily. 04/18/16  Yes Lendon Colonel, NP  clopidogrel (PLAVIX) 75 MG tablet Take 1 tablet (75 mg total) by mouth daily. 04/18/16  Yes Lendon Colonel, NP  divalproex (DEPAKOTE) 500 MG DR tablet Take 3 tablets (1,500 mg total) by mouth 2 (two) times daily. 01/04/17  Yes Penumalli, Earlean Polka, MD  Doxepin HCl 5 % CREA Apply 2 g topically 3 (three) times daily. Pt applies to chest and legs.   Yes [provider]  furosemide (LASIX) 40 MG tablet Take 1 tablet (40 mg total) by mouth daily. 04/11/16  Yes [name], MD  insulin glargine (LANTUS) 100 unit/mL SOPN Inject 15 Units into the skin at bedtime.   Yes [provider]  isosorbide mononitrate (IMDUR) 60 MG 24 hr tablet Take 90 mg by mouth daily.   Yes [provider]  levETIRAcetam (KEPPRA) 1000 MG tablet Take 1 tablet (1,000 mg total) by mouth 2 (two) times daily. 01/04/17  Yes Penumalli, Vikram R, MD  lidocaine (LIDODERM) 5 % Place 1 patch onto the skin daily. Remove & Discard patch within 12 hours or as directed by MD Patient taking differently: Place 1 patch onto the skin daily as needed (for pain). Remove &  Discard patch within 12 hours or as directed by MD 10/08/16  Yes Leaphart, Zack Seal, PA-C  lidocaine (XYLOCAINE) 5 % ointment Apply 2-3 application topically 4 (four) times daily. Pt applies to chest and legs.   Yes [provider]  metoprolol tartrate (LOPRESSOR) 25 MG tablet Take 25 mg by mouth 2 (two) times daily.   Yes [provider]  ranolazine (RANEXA) 500 MG 12 hr tablet Take 500 mg by mouth 2 (two) times daily.   Yes [provider]  benzonatate (TESSALON) 100 MG capsule Take 1 capsule (100 mg total) by mouth 3 (three) times daily as needed for cough. 02/02/17   Julianne Rice, MD  predniSONE (DELTASONE) 20 MG tablet 3 tabs po day one, then 2 po daily x 4 days 02/02/17   Julianne Rice, MD  traMADol (ULTRAM) 50 MG tablet Take 1 tablet (50 mg total) by mouth every 6 (six) hours as needed for severe  pain. 02/02/17   Julianne Rice, MD    Family History Family History  Problem Relation Age of Onset  . Diabetes Father   . Hypertension Father   . Arthritis Unknown   . Diabetes Unknown   . Asthma Unknown   . Stroke Sister     Social History Social History  Substance Use Topics  . Smoking status: Current Every Day Smoker    Packs/day: 0.25    Years: 30.00    Types: Cigarettes  . Smokeless tobacco: Never Used     Comment: 3 cigs per day  . Alcohol use No     Allergies   Aspirin   Review of Systems Review of Systems  Constitutional: Negative for chills, fatigue and fever.  HENT: Negative for facial swelling and sore throat.   Respiratory: Positive for cough and shortness of breath. Negative for chest tightness.   Cardiovascular: Positive for chest pain. Negative for palpitations and leg swelling.  Gastrointestinal: Negative for abdominal pain, constipation, diarrhea, nausea and vomiting.  Genitourinary: Negative for dysuria, flank pain and frequency.  Musculoskeletal: Negative for back pain, myalgias and neck pain.  Skin: Negative for rash and  wound.  Neurological: Negative for dizziness, weakness, light-headedness, numbness and headaches.  All other systems reviewed and are negative.    Physical Exam Updated Vital Signs BP (!) 108/94 (BP Location: Left Arm)   Pulse 64   Temp 98.6 F (37 C) (Oral)   Resp 16   Ht 5\' 7"  (1.702 m)   Wt 73.5 kg (162 lb)   SpO2 98%   BMI 25.37 kg/m   Physical Exam  Constitutional: He is oriented to person, place, and time. He appears well-developed and well-nourished. No distress.  HENT:  Head: Normocephalic and atraumatic.  Mouth/Throat: Oropharynx is clear and moist. No oropharyngeal exudate.  Eyes: EOM are normal. Pupils are equal, round, and reactive to light.  Neck: Normal range of motion. Neck supple.  Cardiovascular: Normal rate and regular rhythm.  Exam reveals no gallop and no friction rub.   No murmur heard. Pulmonary/Chest: Effort normal. No respiratory distress. He has no wheezes. He has no rales. He exhibits no tenderness.  Mildly decreased breath sounds bilateral bases.  Abdominal: Soft. Bowel sounds are normal. There is no tenderness. There is no rebound and no guarding.  Musculoskeletal: Normal range of motion. He exhibits edema. He exhibits no tenderness.  Patient has minimal swelling to the right lower extremity compared to left. There is no calf tenderness. Distal pulses are intact.  Neurological: He is alert and oriented to person, place, and time.  Moving all extremities without focal deficit. Sensation intact.  Skin: Skin is warm and dry. Capillary refill takes less than 2 seconds. No rash noted. No erythema.  Psychiatric: He has a normal mood and affect. His behavior is normal.  Nursing note and vitals reviewed.    ED Treatments / Results  Labs (all labs ordered are listed, but only abnormal results are displayed) Labs Reviewed  BASIC METABOLIC PANEL - Abnormal; Notable for the following:       Result Value   Glucose, Bld 111 (*)    All other components  within normal limits  CBC - Abnormal; Notable for the following:    HCT 38.0 (*)    All other components within normal limits  TROPONIN I  BRAIN NATRIURETIC PEPTIDE  D-DIMER, QUANTITATIVE (NOT AT [facility])  TROPONIN I    EKG  EKG Interpretation  Date/Time:  Thursday February 02 2017  11:05:10 EDT Ventricular Rate:  60 PR Interval:    QRS Duration: 85 QT Interval:  408 QTC Calculation: 408 R Axis:   -50 Text Interpretation:  Sinus rhythm Probable left atrial enlargement Left anterior fascicular block Consider anterior infarct Confirmed by Lita Mains  MD, Kaelen Caughlin ([id number]) on 02/02/2017 12:45:19 PM       Radiology No results found.  Procedures Procedures (including critical care time)  Medications Ordered in ED Medications  albuterol (PROVENTIL) (2.5 MG/3ML) 0.083% nebulizer solution 5 mg (5 mg Nebulization Given 02/02/17 1301)     Initial Impression / Assessment and Plan / ED Course  I have reviewed the triage vital signs and the nursing notes.  Pertinent labs & imaging results that were available during my care of the patient were reviewed by me and considered in my medical decision making (see chart for details).    Chest pain is atypical for coronary artery disease. No ischemic changes on EKG. Troponin 2 is negative. Patient also has a normal d-dimer.   Final Clinical Impressions(s) / ED Diagnoses   Final diagnoses:  Upper respiratory tract infection, unspecified type  Anterior chest wall pain    New Prescriptions Discharge Medication List as of 02/02/2017  4:07 PM    START taking these medications   Details  benzonatate (TESSALON) 100 MG capsule Take 1 capsule (100 mg total) by mouth 3 (three) times daily as needed for cough., Starting Thu 02/02/2017, Print    predniSONE (DELTASONE) 20 MG tablet 3 tabs po day one, then 2 po daily x 4 days, Print    traMADol (ULTRAM) 50 MG tablet Take 1 tablet (50 mg total) by mouth every 6 (six) hours as needed for severe pain., Starting  Thu 02/02/2017, Print         Julianne Rice, MD 02/05/17 [phone]

## 2017-02-03 ENCOUNTER — Encounter: Payer: Self-pay | Admitting: *Deleted

## 2017-02-06 ENCOUNTER — Ambulatory Visit (INDEPENDENT_AMBULATORY_CARE_PROVIDER_SITE_OTHER): Payer: Medicaid Other | Admitting: Cardiovascular Disease

## 2017-02-06 ENCOUNTER — Encounter: Payer: Self-pay | Admitting: Cardiovascular Disease

## 2017-02-06 VITALS — BP 132/84 | HR 69 | Ht 67.0 in | Wt 158.0 lb

## 2017-02-06 DIAGNOSIS — I25708 Atherosclerosis of coronary artery bypass graft(s), unspecified, with other forms of angina pectoris: Secondary | ICD-10-CM | POA: Diagnosis not present

## 2017-02-06 DIAGNOSIS — G8918 Other acute postprocedural pain: Secondary | ICD-10-CM | POA: Diagnosis not present

## 2017-02-06 DIAGNOSIS — R0789 Other chest pain: Secondary | ICD-10-CM

## 2017-02-06 DIAGNOSIS — E785 Hyperlipidemia, unspecified: Secondary | ICD-10-CM

## 2017-02-06 DIAGNOSIS — I10 Essential (primary) hypertension: Secondary | ICD-10-CM

## 2017-02-06 NOTE — Progress Notes (Signed)
SUBJECTIVE: The patient presents for follow-up of coronary artery disease. He underwent 5 vessel CABG on 03/28/16.  He was evaluated a few days ago in the ED for cough and chest pain deemed to be atypical for coronary artery disease. Troponins were normal.  I personally reviewed all labs, studies, and documentation related to this ED visit on 02/02/17.  Chest x-ray showed no pneumonia or CHF.  Troponins, d-dimer, BNP, white blood cells, hemoglobin, and renal function were all normal.  ECG which I personally interpreted demonstrated sinus rhythm with a left anterior fascicular block.  Ever since bypass surgery, he has struggled with musculoskeletal chest wall and left arm pain. It is exacerbated with movement and lifting heavy objects. He could not afford cardiac rehabilitation. He denies exertional chest pain and dyspnea.  He is frustrated by his symptoms. He requests a referral to a pain management clinic.    Review of Systems: As per "subjective", otherwise negative.  Allergies  Allergen Reactions  . Aspirin Swelling and Other (See Comments)    Reaction:  Facial swelling     Current Outpatient Prescriptions  Medication Sig Dispense Refill  . amLODipine (NORVASC) 10 MG tablet Take 1 tablet (10 mg total) by mouth daily. 90 tablet 2  . clopidogrel (PLAVIX) 75 MG tablet Take 1 tablet (75 mg total) by mouth daily. 90 tablet 2  . divalproex (DEPAKOTE) 500 MG DR tablet Take 3 tablets (1,500 mg total) by mouth 2 (two) times daily. 540 tablet 4  . Doxepin HCl 5 % CREA Apply 2 g topically 3 (three) times daily. Pt applies to chest and legs.    . insulin glargine (LANTUS) 100 unit/mL SOPN Inject 15 Units into the skin at bedtime.    . isosorbide mononitrate (IMDUR) 60 MG 24 hr tablet Take 90 mg by mouth daily.    Marland Kitchen levETIRAcetam (KEPPRA) 1000 MG tablet Take 1 tablet (1,000 mg total) by mouth 2 (two) times daily. 180 tablet 4  . lidocaine (LIDODERM) 5 % Place 1 patch onto the skin  daily. Remove & Discard patch within 12 hours or as directed by MD (Patient taking differently: Place 1 patch onto the skin daily as needed (for pain). Remove & Discard patch within 12 hours or as directed by MD) 5 patch 0  . lidocaine (XYLOCAINE) 5 % ointment Apply 2-3 application topically 4 (four) times daily. Pt applies to chest and legs.    . metoprolol tartrate (LOPRESSOR) 25 MG tablet Take 25 mg by mouth 2 (two) times daily.    . ranolazine (RANEXA) 500 MG 12 hr tablet Take 500 mg by mouth 2 (two) times daily.    . traMADol (ULTRAM) 50 MG tablet Take 1 tablet (50 mg total) by mouth every 6 (six) hours as needed for severe pain. 10 tablet 0   No current facility-administered medications for this visit.     Past Medical History:  Diagnosis Date  . Anxiety   . Bipolar 1 disorder ([place])   . Chest pain   . Chronic back pain   . Chronic chest wall pain   . Coronary artery disease   . Depression   . Diabetes mellitus    Type II  . Hallucinations    "long history of them"  . Headache(784.0)   . History of gout   . HTN (hypertension)   . Insomnia   . Neuropathy   . Pain management   . Right leg pain    chronic  .  Schizophrenia ([place])   . Seizures ([place])    last sz between July 5-9th, 2016; epilepsy  . Shortness of breath dyspnea    with exertion  . Sleep apnea   . Transfusion of blood product refused for religious reason     Past Surgical History:  Procedure Laterality Date  . ANTERIOR CERVICAL DECOMP/DISCECTOMY FUSION N/A 12/14/2015   Procedure: ANTERIOR CERVICAL DECOMPRESSION/DISCECTOMY FUSION CERVICAL FIVE -SIX;  Surgeon: Earnie Larsson, MD;  Location: [facility];  Service: Neurosurgery;  Laterality: N/A;  . BIOPSY  05/07/2015   Procedure: BIOPSY (Gastric);  Surgeon: [name], MD;  Location: [facility];  Service: Endoscopy;;  . CARDIAC CATHETERIZATION N/A 03/07/2016   Procedure: Left Heart Cath and Coronary Angiography;  Surgeon: Belva Crome, MD;  Location: [facility];  Service: Cardiovascular;  Laterality: N/A;  . COLONOSCOPY WITH PROPOFOL N/A 05/07/2015   TDD:[id number] diverticulosis, multiple colon polyps removed, tubular adenoma, serrated colon polyp. Next colonoscopy October 2019  . CORONARY ARTERY BYPASS GRAFT N/A 03/28/2016   Procedure: CORONARY ARTERY BYPASS GRAFTING (CABG) x 5 USING GREATER SAPHENOUS VEIN;  Surgeon: [name], MD;  Location: [facility];  Service: Open Heart Surgery;  Laterality: N/A;  . ENDOVEIN HARVEST OF GREATER SAPHENOUS VEIN Right 03/28/2016   Procedure: ENDOVEIN HARVEST OF GREATER SAPHENOUS VEIN;  Surgeon: [name], MD;  Location: [facility];  Service: Open Heart Surgery;  Laterality: Right;  . ESOPHAGEAL DILATION N/A 05/07/2015   Procedure: ESOPHAGEAL DILATION WITH 56FR MALONEY DILATOR;  Surgeon: [name], MD;  [facility];  Service: Endoscopy;  Laterality: N/A;  . ESOPHAGOGASTRODUODENOSCOPY (EGD) WITH PROPOFOL N/A 05/07/2015   RMR: Status post dilation of normal esophagus. Gastritis.  Marland Kitchen KNEE SURGERY Left    arthroscopy  . MANDIBLE FRACTURE SURGERY    . POLYPECTOMY  05/07/2015   Procedure: POLYPECTOMY (Hepatic Flexure, Distal Transverse Colon, Rectal);  Surgeon: [name], MD;  [facility];  Service: Endoscopy;;  . TEE WITHOUT CARDIOVERSION N/A 03/28/2016   Procedure: TRANSESOPHAGEAL ECHOCARDIOGRAM (TEE);  Surgeon: [name], MD;  Location: [facility];  Service: Open Heart Surgery;  Laterality: N/A;  . TONSILLECTOMY      Social History   Social History  . Marital status: Single    Spouse name: N/A  . Number of children: N/A  . Years of education: 11th grade   Occupational History  . unemployed Not Employed   Social History Main Topics  . Smoking status: Current Every Day Smoker    Packs/day: 0.25    Years: 30.00    Types: Cigarettes  . Smokeless tobacco: Never Used     Comment: 3 cigs per day  . Alcohol use No  . Drug use: No  . Sexual activity: Not Currently   Other Topics Concern  .  Not on file   Social History Narrative   Lives alone   Drinks a cup of coffee a day     Vitals:   02/06/17 0956  BP: 132/84  Pulse: 69  SpO2: 99%  Weight: 158 lb (71.7 kg)  Height: 5\' 7"  (1.702 m)    Wt Readings from Last 3 Encounters:  02/06/17 158 lb (71.7 kg)  02/02/17 162 lb (73.5 kg)  01/24/17 162 lb (73.5 kg)     PHYSICAL EXAM General: NAD HEENT: Poor dentition. Neck: No JVD, no thyromegaly. Lungs: Clear to auscultation bilaterally with normal respiratory effort. CV: Nondisplaced PMI.  Regular rate and rhythm, normal S1/S2, no S3/S4,  no murmur. No pretibial or periankle edema.  No carotid bruit.  +chest wall tenderness. Abdomen: Soft, nontender, no distention.  Neurologic: Alert and oriented.  Psych: Normal affect. Skin: Normal. Musculoskeletal: No gross deformities.    ECG: Most recent ECG reviewed.   Labs: Lab Results  Component Value Date/Time   K 3.6 02/02/2017 11:05 AM   BUN 15 02/02/2017 11:05 AM   BUN 19 11/17/2016 10:17 AM   CREATININE 1.13 02/02/2017 11:05 AM   CREATININE 0.99 03/17/2016 02:36 PM   ALT 18 12/18/2016 05:45 PM   TSH 0.58 10/19/2015 10:06 AM   HGB 13.5 02/02/2017 11:05 AM     Lipids: Lab Results  Component Value Date/Time   LDLCALC 53 03/22/2016 08:53 AM   CHOL 120 03/22/2016 08:53 AM   TRIG 37 03/22/2016 08:53 AM   HDL 60 03/22/2016 08:53 AM       ASSESSMENT AND PLAN: 1. CAD s/p 5-v CABG 03/2016: Continue statin, Plavix (allergic to ASA), metoprolol, Ranexa, and nitrates.   2. Hypertension: Controlled.   3. Hyperlipidemia: Continue statin.  4. Musculoskeletal chest wall and left arm pain: I will make a referral to a pain management clinic. I will also make referral to physical therapy.    Disposition: Follow up 1 yr.  Kate Sable, M.D., F.A.C.C.

## 2017-02-06 NOTE — Patient Instructions (Addendum)
Medication Instructions:  Continue all current medications.  Labwork: none  Testing/Procedures: none  Follow-Up: Your physician wants you to follow up in:  1 year.  You will receive a reminder letter in the mail one-two months in advance.  If you don't receive a letter, please call our office to schedule the follow up appointment   Any Other Special Instructions Will Be Listed Below (If Applicable).  You have been referred to:  Pain management clinic.  You have been referred to:  Physical therapy.   If you need a refill on your cardiac medications before your next appointment, please call your pharmacy.

## 2017-03-03 ENCOUNTER — Other Ambulatory Visit: Payer: Self-pay | Admitting: "Endocrinology

## 2017-03-13 ENCOUNTER — Encounter: Payer: Self-pay | Admitting: *Deleted

## 2017-03-13 ENCOUNTER — Ambulatory Visit: Payer: Medicaid Other | Admitting: Cardiovascular Disease

## 2017-03-23 ENCOUNTER — Ambulatory Visit: Payer: Medicaid Other | Admitting: "Endocrinology

## 2017-03-29 ENCOUNTER — Other Ambulatory Visit: Payer: Self-pay | Admitting: "Endocrinology

## 2017-03-30 ENCOUNTER — Ambulatory Visit: Payer: Medicaid Other | Admitting: Cardiovascular Disease

## 2017-03-30 ENCOUNTER — Encounter: Payer: Self-pay | Admitting: Cardiovascular Disease

## 2017-03-30 ENCOUNTER — Encounter (HOSPITAL_COMMUNITY): Payer: Self-pay | Admitting: *Deleted

## 2017-03-30 ENCOUNTER — Emergency Department (HOSPITAL_COMMUNITY)
Admission: EM | Admit: 2017-03-30 | Discharge: 2017-03-30 | Disposition: A | Discharge status: Home / Self Care | Payer: Medicaid Other | Attending: Emergency Medicine | Admitting: Emergency Medicine

## 2017-03-30 ENCOUNTER — Emergency Department (HOSPITAL_COMMUNITY): Payer: Medicaid Other

## 2017-03-30 ENCOUNTER — Ambulatory Visit (INDEPENDENT_AMBULATORY_CARE_PROVIDER_SITE_OTHER): Payer: Medicaid Other | Admitting: Cardiovascular Disease

## 2017-03-30 VITALS — BP 142/70 | HR 72 | Ht 67.0 in | Wt 154.0 lb

## 2017-03-30 DIAGNOSIS — M79602 Pain in left arm: Secondary | ICD-10-CM

## 2017-03-30 DIAGNOSIS — Z951 Presence of aortocoronary bypass graft: Secondary | ICD-10-CM | POA: Insufficient documentation

## 2017-03-30 DIAGNOSIS — E785 Hyperlipidemia, unspecified: Secondary | ICD-10-CM | POA: Diagnosis not present

## 2017-03-30 DIAGNOSIS — I10 Essential (primary) hypertension: Secondary | ICD-10-CM | POA: Diagnosis not present

## 2017-03-30 DIAGNOSIS — E119 Type 2 diabetes mellitus without complications: Secondary | ICD-10-CM | POA: Insufficient documentation

## 2017-03-30 DIAGNOSIS — R51 Headache: Secondary | ICD-10-CM | POA: Insufficient documentation

## 2017-03-30 DIAGNOSIS — Z794 Long term (current) use of insulin: Secondary | ICD-10-CM | POA: Diagnosis not present

## 2017-03-30 DIAGNOSIS — R200 Anesthesia of skin: Secondary | ICD-10-CM | POA: Diagnosis not present

## 2017-03-30 DIAGNOSIS — F17210 Nicotine dependence, cigarettes, uncomplicated: Secondary | ICD-10-CM | POA: Diagnosis not present

## 2017-03-30 DIAGNOSIS — R0789 Other chest pain: Secondary | ICD-10-CM

## 2017-03-30 DIAGNOSIS — M542 Cervicalgia: Secondary | ICD-10-CM

## 2017-03-30 DIAGNOSIS — I2510 Atherosclerotic heart disease of native coronary artery without angina pectoris: Secondary | ICD-10-CM | POA: Diagnosis not present

## 2017-03-30 DIAGNOSIS — I25708 Atherosclerosis of coronary artery bypass graft(s), unspecified, with other forms of angina pectoris: Secondary | ICD-10-CM

## 2017-03-30 DIAGNOSIS — Z79899 Other long term (current) drug therapy: Secondary | ICD-10-CM | POA: Insufficient documentation

## 2017-03-30 DIAGNOSIS — G8918 Other acute postprocedural pain: Secondary | ICD-10-CM

## 2017-03-30 DIAGNOSIS — Z7902 Long term (current) use of antithrombotics/antiplatelets: Secondary | ICD-10-CM | POA: Insufficient documentation

## 2017-03-30 DIAGNOSIS — Z9289 Personal history of other medical treatment: Secondary | ICD-10-CM

## 2017-03-30 MED ORDER — ONDANSETRON HCL 4 MG/2ML IJ SOLN
4.0000 mg | Freq: Once | INTRAMUSCULAR | Status: AC
  Administered 2017-03-30: 4 mg via INTRAVENOUS
  Filled 2017-03-30: qty 2

## 2017-03-30 MED ORDER — OXYCODONE-ACETAMINOPHEN 5-325 MG PO TABS: 1.0000 | ORAL_TABLET | Freq: Four times a day (QID) | ORAL | 0 refills | Status: DC | PRN

## 2017-03-30 MED ORDER — PREDNISONE 10 MG PO TABS: 20.0000 mg | ORAL_TABLET | Freq: Every day | ORAL | 0 refills | Status: DC

## 2017-03-30 MED ORDER — HYDROMORPHONE HCL 1 MG/ML IJ SOLN
1.0000 mg | Freq: Once | INTRAMUSCULAR | Status: AC
  Administered 2017-03-30: 1 mg via INTRAVENOUS
  Filled 2017-03-30: qty 1

## 2017-03-30 NOTE — Patient Instructions (Signed)

## 2017-03-30 NOTE — Progress Notes (Signed)
SUBJECTIVE: The patient presents for follow-up of coronary artery disease. He underwent 5 vessel CABG on 03/28/16.  Ever since bypass surgery, he has struggled with musculoskeletal chest wall and left arm pain. It is exacerbated with movement and lifting heavy objects. He could not afford cardiac rehabilitation. He denies exertional chest pain and dyspnea.  He was apparently hospitalized in [place] for chest pain. I do not have these records. He tells me everything regarding his heart came back normal. He tells me he was prescribed 10 tablets of Percocet which has eased the pain a little and it caused him to sleep well. He denies exertional chest pain and shortness of breath. He also complains of cervical neck pain, migraines, and left arm tingling and numbness and pain.  He underwent anterior cervical spine decompression surgery in May 2017. This was performed by Dr. Earnie Larsson.  He said his PCP made a pain management referral but he has yet to hear from them.   Review of Systems: As per "subjective", otherwise negative.  Allergies  Allergen Reactions  . Aspirin Swelling and Other (See Comments)    Reaction:  Facial swelling     Current Outpatient Prescriptions  Medication Sig Dispense Refill  . ACCU-CHEK AVIVA PLUS test strip USE AS DIRECTED TO TEST 4 TIMES DAILY. 150 each 5  . amLODipine (NORVASC) 10 MG tablet Take 1 tablet (10 mg total) by mouth daily. 90 tablet 2  . clopidogrel (PLAVIX) 75 MG tablet Take 1 tablet (75 mg total) by mouth daily. 90 tablet 2  . divalproex (DEPAKOTE) 500 MG DR tablet Take 3 tablets (1,500 mg total) by mouth 2 (two) times daily. 540 tablet 4  . Doxepin HCl 5 % CREA Apply 2 g topically 3 (three) times daily. Pt applies to chest and legs.    . Insulin Glargine (LANTUS SOLOSTAR) 100 UNIT/ML Solostar Pen Inject 30 Units into the skin at bedtime. 15 mL 0  . isosorbide mononitrate (IMDUR) 60 MG 24 hr tablet Take 90 mg by mouth daily.    Marland Kitchen levETIRAcetam  (KEPPRA) 1000 MG tablet Take 1 tablet (1,000 mg total) by mouth 2 (two) times daily. 180 tablet 4  . lidocaine (LIDODERM) 5 % Place 1 patch onto the skin daily. Remove & Discard patch within 12 hours or as directed by MD (Patient taking differently: Place 1 patch onto the skin daily as needed (for pain). Remove & Discard patch within 12 hours or as directed by MD) 5 patch 0  . lidocaine (XYLOCAINE) 5 % ointment Apply 2-3 application topically 4 (four) times daily. Pt applies to chest and legs.    . metoprolol tartrate (LOPRESSOR) 25 MG tablet Take 25 mg by mouth 2 (two) times daily.    . ranolazine (RANEXA) 500 MG 12 hr tablet Take 500 mg by mouth 2 (two) times daily.    . traMADol (ULTRAM) 50 MG tablet Take 1 tablet (50 mg total) by mouth every 6 (six) hours as needed for severe pain. 10 tablet 0   No current facility-administered medications for this visit.     Past Medical History:  Diagnosis Date  . Anxiety   . Bipolar 1 disorder ([place])   . Chest pain   . Chronic back pain   . Chronic chest wall pain   . Coronary artery disease   . Depression   . Diabetes mellitus    Type II  . Hallucinations    "long history of them"  . Headache(784.0)   .  History of gout   . HTN (hypertension)   . Insomnia   . Neuropathy   . Pain management   . Right leg pain    chronic  . Schizophrenia ([place])   . Seizures ([place])    last sz between July 5-9th, 2016; epilepsy  . Shortness of breath dyspnea    with exertion  . Sleep apnea   . Transfusion of blood product refused for religious reason     Past Surgical History:  Procedure Laterality Date  . ANTERIOR CERVICAL DECOMP/DISCECTOMY FUSION N/A 12/14/2015   Procedure: ANTERIOR CERVICAL DECOMPRESSION/DISCECTOMY FUSION CERVICAL FIVE -SIX;  Surgeon: Earnie Larsson, MD;  Location: [facility];  Service: Neurosurgery;  Laterality: N/A;  . BIOPSY  05/07/2015   Procedure: BIOPSY (Gastric);  Surgeon: [name], MD;  Location: [facility];  Service: Endoscopy;;   . CARDIAC CATHETERIZATION N/A 03/07/2016   Procedure: Left Heart Cath and Coronary Angiography;  Surgeon: Belva Crome, MD;  Location: [facility];  Service: Cardiovascular;  Laterality: N/A;  . COLONOSCOPY WITH PROPOFOL N/A 05/07/2015   [id number] diverticulosis, multiple colon polyps removed, tubular adenoma, serrated colon polyp. Next colonoscopy October 2019  . CORONARY ARTERY BYPASS GRAFT N/A 03/28/2016   Procedure: CORONARY ARTERY BYPASS GRAFTING (CABG) x 5 USING GREATER SAPHENOUS VEIN;  Surgeon: [name], MD;  Location: [facility];  Service: Open Heart Surgery;  Laterality: N/A;  . ENDOVEIN HARVEST OF GREATER SAPHENOUS VEIN Right 03/28/2016   Procedure: ENDOVEIN HARVEST OF GREATER SAPHENOUS VEIN;  Surgeon: [name], MD;  Location: [facility];  Service: Open Heart Surgery;  Laterality: Right;  . ESOPHAGEAL DILATION N/A 05/07/2015   Procedure: ESOPHAGEAL DILATION WITH 56FR MALONEY DILATOR;  Surgeon: [name], MD;  [facility];  Service: Endoscopy;  Laterality: N/A;  . ESOPHAGOGASTRODUODENOSCOPY (EGD) WITH PROPOFOL N/A 05/07/2015   RMR: Status post dilation of normal esophagus. Gastritis.  Marland Kitchen KNEE SURGERY Left    arthroscopy  . MANDIBLE FRACTURE SURGERY    . POLYPECTOMY  05/07/2015   Procedure: POLYPECTOMY (Hepatic Flexure, Distal Transverse Colon, Rectal);  Surgeon: [name], MD;  [facility];  Service: Endoscopy;;  . TEE WITHOUT CARDIOVERSION N/A 03/28/2016   Procedure: TRANSESOPHAGEAL ECHOCARDIOGRAM (TEE);  Surgeon: [name], MD;  Location: [facility];  Service: Open Heart Surgery;  Laterality: N/A;  . TONSILLECTOMY      Social History   Social History  . Marital status: Single    Spouse name: N/A  . Number of children: N/A  . Years of education: 11th grade   Occupational History  . unemployed Not Employed   Social History Main Topics  . Smoking status: Current Every Day Smoker    Packs/day: 0.25    Years: 30.00    Types: Cigarettes  .  Smokeless tobacco: Never Used     Comment: 3 cigs per day  . Alcohol use No  . Drug use: No  . Sexual activity: Not Currently   Other Topics Concern  . Not on file   Social History Narrative   Lives alone   Drinks a cup of coffee a day     Vitals:   03/30/17 1357  BP: (!) 142/70  Pulse: 72  SpO2: 97%  Weight: 154 lb (69.9 kg)  Height: 5\' 7"  (1.702 m)    Wt Readings from Last 3 Encounters:  03/30/17 154 lb (69.9 kg)  02/06/17 158 lb (71.7 kg)  02/02/17 162 lb (73.5 kg)  PHYSICAL EXAM General: NAD HEENT: Normal. Neck: No JVD, no thyromegaly. Lungs: Clear to auscultation bilaterally with normal respiratory effort. CV: Nondisplaced PMI.  Regular rate and rhythm, normal S1/S2, no S3/S4, no murmur. No pretibial or periankle edema.  +chest wall tenderness.  Abdomen: Soft, nontender, no distention.  Neurologic: Alert and oriented.  Psych: Normal affect. Skin: Normal. Musculoskeletal: No gross deformities.    ECG: Most recent ECG reviewed.   Labs: Lab Results  Component Value Date/Time   K 3.6 02/02/2017 11:05 AM   BUN 15 02/02/2017 11:05 AM   BUN 19 11/17/2016 10:17 AM   CREATININE 1.13 02/02/2017 11:05 AM   CREATININE 0.99 03/17/2016 02:36 PM   ALT 18 12/18/2016 05:45 PM   TSH 0.58 10/19/2015 10:06 AM   HGB 13.5 02/02/2017 11:05 AM     Lipids: Lab Results  Component Value Date/Time   LDLCALC 53 03/22/2016 08:53 AM   CHOL 120 03/22/2016 08:53 AM   TRIG 37 03/22/2016 08:53 AM   HDL 60 03/22/2016 08:53 AM       ASSESSMENT AND PLAN:  1. CAD s/p 5-v CABG 03/2016:Continue statin, Plavix (allergic to ASA),metoprolol, Ranexa, and nitrates.   2. Hypertension:Mildly elevated. I will monitor.  3. Hyperlipidemia: Continue statin.  4. Musculoskeletal chest wall and left arm pain: I previously made a referral to a pain management clinic. Unfortunately because he has Medicaid, this has to come from his PCP. He tells me his PCP also made a referral but  he has not heard from them yet. I also made a referral to physical therapy.  5. Cervical spine pain, migraines, and left arm pain: I recommended he follow up with Dr. Earnie Larsson.     Disposition: Follow up 1 yr   Kate Sable, M.D., F.A.C.C.

## 2017-03-30 NOTE — Discharge Instructions (Signed)
Follow up with your family md or  dr. pool

## 2017-03-30 NOTE — ED Provider Notes (Signed)
[facility] DEPT Provider Note   CSN: [id number] Arrival date & time: 03/30/17  1504     History   Chief Complaint Chief Complaint  Patient presents with  . Torticollis    HPI Melvin Taylor is a 51 y.o. male.  Patient complains of neck pain and headache. He has a history of surgery to his neck. He also complains of numbness in his left arm   The history is provided by the patient. No language interpreter was used.  Illness  This is a recurrent problem. The current episode started 12 to 24 hours ago. The problem occurs constantly. The problem has not changed since onset.Pertinent negatives include no chest pain, no abdominal pain and no headaches. Exacerbated by: movement of neck. Nothing relieves the symptoms.    Past Medical History:  Diagnosis Date  . Anxiety   . Bipolar 1 disorder ([place])   . Chest pain   . Chronic back pain   . Chronic chest wall pain   . Coronary artery disease   . Depression   . Diabetes mellitus    Type II  . Hallucinations    "long history of them"  . Headache(784.0)   . History of gout   . HTN (hypertension)   . Insomnia   . Neuropathy   . Pain management   . Right leg pain    chronic  . Schizophrenia ([place])   . Seizures ([place])    last sz between July 5-9th, 2016; epilepsy  . Shortness of breath dyspnea    with exertion  . Sleep apnea   . Transfusion of blood product refused for religious reason     Patient Active Problem List   Diagnosis Date Noted  . CAD (coronary artery disease) 03/28/2016  . Chest pain 03/21/2016  . Hyperlipidemia 03/11/2016  . Ischemic chest pain   . NSTEMI (non-ST elevated myocardial infarction) ([place])   . Coronary atherosclerosis of native coronary artery 03/03/2016  . Cervical spinal stenosis 12/14/2015  . Loss of weight 08/25/2015  . Essential hypertension, benign 07/23/2015  . Personal history of noncompliance with medical treatment, presenting hazards to health 07/23/2015  . Abdominal pain  06/08/2015  . Constipation 06/08/2015  . History of colonic polyps   . Diverticulosis of colon without hemorrhage   . Mucosal abnormality of stomach   . Encounter for screening colonoscopy 04/16/2015  . Dysphagia 04/16/2015  . Partial symptomatic epilepsy with complex partial seizures, intractable, without status epilepticus ([place]) 11/10/2014  . DM type 2 causing vascular disease ([place]) 06/29/2014  . Musculoskeletal pain 06/29/2014  . Hyperglycemia without ketosis 09/07/2013  . 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 Knee pain 12/28/2010  . Medial meniscus, posterior horn derangement 12/28/2010    Past Surgical History:  Procedure Laterality Date  . ANTERIOR CERVICAL DECOMP/DISCECTOMY FUSION N/A 12/14/2015   Procedure: ANTERIOR CERVICAL DECOMPRESSION/DISCECTOMY FUSION CERVICAL FIVE -SIX;  Surgeon: Earnie Larsson, MD;  Location: [facility];  Service: Neurosurgery;  Laterality: N/A;  . BIOPSY  05/07/2015   Procedure: BIOPSY (Gastric);  Surgeon: [name], MD;  [facility];  Service: Endoscopy;;  . CARDIAC CATHETERIZATION N/A 03/07/2016   Procedure: Left Heart Cath and Coronary Angiography;  Surgeon: Belva Crome, MD;  Location: [facility];  Service: Cardiovascular;  Laterality: N/A;  . COLONOSCOPY WITH PROPOFOL N/A 05/07/2015   [id number] diverticulosis, multiple colon polyps removed, tubular adenoma, serrated colon polyp. Next colonoscopy October 2019  . CORONARY ARTERY BYPASS GRAFT N/A 03/28/2016  Procedure: CORONARY ARTERY BYPASS GRAFTING (CABG) x 5 USING GREATER SAPHENOUS VEIN;  Surgeon: [name], MD;  Location: [facility];  Service: Open Heart Surgery;  Laterality: N/A;  . ENDOVEIN HARVEST OF GREATER SAPHENOUS VEIN Right 03/28/2016   Procedure: ENDOVEIN HARVEST OF GREATER SAPHENOUS VEIN;  Surgeon: [name], MD;  Location: [facility];  Service: Open Heart Surgery;  Laterality: Right;  . ESOPHAGEAL DILATION N/A 05/07/2015   Procedure: ESOPHAGEAL DILATION  WITH 56FR MALONEY DILATOR;  Surgeon: [name], MD;  [facility];  Service: Endoscopy;  Laterality: N/A;  . ESOPHAGOGASTRODUODENOSCOPY (EGD) WITH PROPOFOL N/A 05/07/2015   RMR: Status post dilation of normal esophagus. Gastritis.  Marland Kitchen KNEE SURGERY Left    arthroscopy  . MANDIBLE FRACTURE SURGERY    . POLYPECTOMY  05/07/2015   Procedure: POLYPECTOMY (Hepatic Flexure, Distal Transverse Colon, Rectal);  Surgeon: [name], MD;  [facility];  Service: Endoscopy;;  . TEE WITHOUT CARDIOVERSION N/A 03/28/2016   Procedure: TRANSESOPHAGEAL ECHOCARDIOGRAM (TEE);  Surgeon: [name], MD;  Location: [facility];  Service: Open Heart Surgery;  Laterality: N/A;  . TONSILLECTOMY         Home Medications    Prior to Admission medications   Medication Sig Start Date End Date Taking? Authorizing Provider  ACCU-CHEK AVIVA PLUS test strip USE AS DIRECTED TO TEST 4 TIMES DAILY. 03/29/17  Yes Cassandria Anger, MD  amLODipine (NORVASC) 10 MG tablet Take 1 tablet (10 mg total) by mouth daily. 04/18/16  Yes Lendon Colonel, NP  clopidogrel (PLAVIX) 75 MG tablet Take 1 tablet (75 mg total) by mouth daily. 04/18/16  Yes Lendon Colonel, NP  divalproex (DEPAKOTE) 500 MG DR tablet Take 3 tablets (1,500 mg total) by mouth 2 (two) times daily. 01/04/17  Yes Penumalli, Earlean Polka, MD  Doxepin HCl 5 % CREA Apply 2 g topically 3 (three) times daily. Pt applies to chest and legs.   Yes [provider]  Insulin Glargine (LANTUS SOLOSTAR) 100 UNIT/ML Solostar Pen Inject 30 Units into the skin at bedtime. 03/03/17  Yes Cassandria Anger, MD  isosorbide mononitrate (IMDUR) 60 MG 24 hr tablet Take 90 mg by mouth daily.   Yes [provider]  levETIRAcetam (KEPPRA) 1000 MG tablet Take 1 tablet (1,000 mg total) by mouth 2 (two) times daily. 01/04/17  Yes Penumalli, Vikram R, MD  lidocaine (LIDODERM) 5 % Place 1 patch onto the skin daily. Remove & Discard patch within 12 hours or as directed  by MD Patient taking differently: Place 1 patch onto the skin daily as needed (for pain). Remove & Discard patch within 12 hours or as directed by MD 10/08/16  Yes Leaphart, Zack Seal, PA-C  lidocaine (XYLOCAINE) 5 % ointment Apply 2-3 application topically 4 (four) times daily. Pt applies to chest and legs.   Yes [provider]  metoprolol tartrate (LOPRESSOR) 25 MG tablet Take 25 mg by mouth 2 (two) times daily.   Yes [provider]  NONFORMULARY OR COMPOUNDED ITEM APPLY 1 GRAM (1 PUMP) TO THE AFFECTED AREA(S) UP TO 4 TIMES A DAY                        DRUG NAME: B5 D3 K10 Pent+ 03/08/17  Yes [provider]  ranolazine (RANEXA) 500 MG 12 hr tablet Take 500 mg by mouth 2 (two) times daily.   Yes [provider]  oxyCODONE-acetaminophen (PERCOCET/ROXICET) 5-325 MG tablet  Take 1 tablet by mouth every 6 (six) hours as needed. 03/30/17   Milton Ferguson, MD  predniSONE (DELTASONE) 10 MG tablet Take 2 tablets (20 mg total) by mouth daily. 03/30/17   Milton Ferguson, MD  traMADol (ULTRAM) 50 MG tablet Take 1 tablet (50 mg total) by mouth every 6 (six) hours as needed for severe pain. Patient not taking: Reported on 03/30/2017 02/02/17   Julianne Rice, MD    Family History Family History  Problem Relation Age of Onset  . Diabetes Father   . Hypertension Father   . Arthritis Unknown   . Diabetes Unknown   . Asthma Unknown   . Stroke Sister     Social History Social History  Substance Use Topics  . Smoking status: Current Every Day Smoker    Packs/day: 0.25    Years: 30.00    Types: Cigarettes  . Smokeless tobacco: Never Used     Comment: 3 cigs per day  . Alcohol use No     Allergies   Aspirin   Review of Systems Review of Systems  Constitutional: Negative for appetite change and fatigue.  HENT: Negative for congestion, ear discharge and sinus pressure.        Neck pain  Eyes: Negative for discharge.  Respiratory: Negative for cough.     Cardiovascular: Negative for chest pain.  Gastrointestinal: Negative for abdominal pain and diarrhea.  Genitourinary: Negative for frequency and hematuria.  Musculoskeletal: Negative for back pain.  Skin: Negative for rash.  Neurological: Negative for seizures and headaches.  Psychiatric/Behavioral: Negative for hallucinations.     Physical Exam Updated Vital Signs There were no vitals taken for this visit.  Physical Exam  Constitutional: He is oriented to person, place, and time. He appears well-developed.  HENT:  Head: Normocephalic.  Eyes: Conjunctivae and EOM are normal. No scleral icterus.  Tenderness to left lateral neck.  Neck: Neck supple. No thyromegaly present.  Cardiovascular: Normal rate and regular rhythm.  Exam reveals no gallop and no friction rub.   No murmur heard. Pulmonary/Chest: No stridor. He has no wheezes. He has no rales. He exhibits no tenderness.  Abdominal: He exhibits no distension. There is no tenderness. There is no rebound.  Musculoskeletal: Normal range of motion. He exhibits no edema.  Patient has paresthesias in his left arm but no decreased strength  Lymphadenopathy:    He has no cervical adenopathy.  Neurological: He is oriented to person, place, and time. He exhibits normal muscle tone. Coordination normal.  Skin: No rash noted. No erythema.  Psychiatric: He has a normal mood and affect. His behavior is normal.     ED Treatments / Results  Labs (all labs ordered are listed, but only abnormal results are displayed) Labs Reviewed - No data to display  EKG  EKG Interpretation None       Radiology Dg Cervical Spine Complete  Result Date: 03/30/2017 CLINICAL DATA:  Left-sided neck pain for 4 days.  No known trauma. EXAM: CERVICAL SPINE - COMPLETE 4+ VIEW COMPARISON:  January 20, 2016 FINDINGS: Anterior plate and screw fixation is identified at C5-6. A disc spacer device is located in good position. There is retrolisthesis of C5 versus  C6 measuring 2.3 mm today, similar in the interval. No other malalignment. The cervicothoracic junction is not well seen but grossly unremarkable. No acute fractures are noted. The pre odontoid space and prevertebral soft tissues are normal. The right-sided and left-sided neural foramina are patent. Limited views of the odontoid  process are unremarkable. The lateral masses of C1 are in good position relative to C2. IMPRESSION: No cause for pain identified. Postsurgical changes. Stable retrolisthesis of C5 versus C6. Electronically Signed   By: Dorise Bullion III M.D   On: 03/30/2017 16:00    Procedures Procedures (including critical care time)  Medications Ordered in ED Medications  HYDROmorphone (DILAUDID) injection 1 mg (1 mg Intravenous Given 03/30/17 1538)  ondansetron (ZOFRAN) injection 4 mg (4 mg Intravenous Given 03/30/17 1538)     Initial Impression / Assessment and Plan / ED Course  I have reviewed the triage vital signs and the nursing notes.  Pertinent labs & imaging results that were available during my care of the patient were reviewed by me and considered in my medical decision making (see chart for details).     neck pain with radiculopathy.   Patient will be placed on prednisone and pain medicine and the patient wants to be followed back up his neurosurgeon  Final Clinical Impressions(s) / ED Diagnoses   Final diagnoses:  Neck pain    New Prescriptions New Prescriptions   OXYCODONE-ACETAMINOPHEN (PERCOCET/ROXICET) 5-325 MG TABLET    Take 1 tablet by mouth every 6 (six) hours as needed.   PREDNISONE (DELTASONE) 10 MG TABLET    Take 2 tablets (20 mg total) by mouth daily.     Milton Ferguson, MD 03/30/17 1705

## 2017-03-30 NOTE — ED Triage Notes (Signed)
Pt reports neck pain (9/10) that radiates down left arm and leg x past 4 days. Pt reports accompanying migraine.

## 2017-04-05 ENCOUNTER — Other Ambulatory Visit: Payer: Self-pay

## 2017-04-05 ENCOUNTER — Encounter (HOSPITAL_COMMUNITY): Payer: Self-pay

## 2017-04-05 ENCOUNTER — Emergency Department (HOSPITAL_COMMUNITY)
Admission: EM | Admit: 2017-04-05 | Discharge: 2017-04-05 | Disposition: A | Discharge status: Home / Self Care | Payer: Medicaid Other | Attending: Emergency Medicine | Admitting: Emergency Medicine

## 2017-04-05 DIAGNOSIS — Z794 Long term (current) use of insulin: Secondary | ICD-10-CM | POA: Insufficient documentation

## 2017-04-05 DIAGNOSIS — M545 Low back pain: Secondary | ICD-10-CM | POA: Diagnosis not present

## 2017-04-05 DIAGNOSIS — M5412 Radiculopathy, cervical region: Secondary | ICD-10-CM | POA: Diagnosis not present

## 2017-04-05 DIAGNOSIS — R531 Weakness: Secondary | ICD-10-CM | POA: Diagnosis not present

## 2017-04-05 DIAGNOSIS — E119 Type 2 diabetes mellitus without complications: Secondary | ICD-10-CM | POA: Diagnosis not present

## 2017-04-05 DIAGNOSIS — Z981 Arthrodesis status: Secondary | ICD-10-CM | POA: Insufficient documentation

## 2017-04-05 DIAGNOSIS — I249 Acute ischemic heart disease, unspecified: Secondary | ICD-10-CM | POA: Insufficient documentation

## 2017-04-05 DIAGNOSIS — Z79899 Other long term (current) drug therapy: Secondary | ICD-10-CM | POA: Diagnosis not present

## 2017-04-05 DIAGNOSIS — F17210 Nicotine dependence, cigarettes, uncomplicated: Secondary | ICD-10-CM | POA: Insufficient documentation

## 2017-04-05 DIAGNOSIS — I252 Old myocardial infarction: Secondary | ICD-10-CM | POA: Insufficient documentation

## 2017-04-05 DIAGNOSIS — I10 Essential (primary) hypertension: Secondary | ICD-10-CM | POA: Insufficient documentation

## 2017-04-05 DIAGNOSIS — M542 Cervicalgia: Secondary | ICD-10-CM | POA: Diagnosis present

## 2017-04-05 LAB — I-STAT CHEM 8, ED
BUN: 20 mg/dL (ref 6–20)
CALCIUM ION: 1.13 mmol/L — AB (ref 1.15–1.40)
CREATININE: 1.3 mg/dL — AB (ref 0.61–1.24)
Chloride: 102 mmol/L (ref 101–111)
Glucose, Bld: 95 mg/dL (ref 65–99)
HEMATOCRIT: 37 % — AB (ref 39.0–52.0)
HEMOGLOBIN: 12.6 g/dL — AB (ref 13.0–17.0)
Potassium: 3.4 mmol/L — ABNORMAL LOW (ref 3.5–5.1)
SODIUM: 141 mmol/L (ref 135–145)
TCO2: 28 mmol/L (ref 22–32)

## 2017-04-05 LAB — I-STAT TROPONIN, ED: Troponin i, poc: 0 ng/mL (ref 0.00–0.08)

## 2017-04-05 MED ORDER — OXYCODONE-ACETAMINOPHEN 5-325 MG PO TABS
1.0000 | ORAL_TABLET | Freq: Once | ORAL | Status: AC
  Administered 2017-04-05: 1 via ORAL
  Filled 2017-04-05: qty 1

## 2017-04-05 MED ORDER — METHOCARBAMOL 500 MG PO TABS
500.0000 mg | ORAL_TABLET | Freq: Once | ORAL | Status: AC
  Administered 2017-04-05: 500 mg via ORAL
  Filled 2017-04-05: qty 1

## 2017-04-05 MED ORDER — OXYCODONE-ACETAMINOPHEN 5-325 MG PO TABS: 1.0000 | ORAL_TABLET | ORAL | 0 refills | Status: DC | PRN

## 2017-04-05 MED ORDER — METHOCARBAMOL 500 MG PO TABS: 500.0000 mg | ORAL_TABLET | Freq: Four times a day (QID) | ORAL | 0 refills | Status: AC

## 2017-04-05 NOTE — ED Notes (Signed)
ED Provider at bedside. 

## 2017-04-05 NOTE — ED Triage Notes (Signed)
Pt reports pain in neck, upper and lower back, left arm, and left leg x 1 month.  Reports has a steel plate in his neck from a surgery last year.  Reports pain is worse with movement.  Denies any chest pain or nausea.

## 2017-04-05 NOTE — Discharge Instructions (Signed)
You may take the oxycodone prescribed for pain relief.  This will make you drowsy - do not drive within 4 hours of taking this medication. ° °

## 2017-04-05 NOTE — ED Provider Notes (Signed)
[facility] DEPT Provider Note   CSN: [id number] Arrival date & time: 04/05/17  1053     History   Chief Complaint Chief Complaint  Patient presents with  . Back Pain    HPI Melvin Taylor is a 51 y.o. male with a history of cervical diskectomy , CAD, cabg x 5 last year with residual chronic chest wall pain, DM, HTN, seizure disorder and gout presenting with persistent left neck pain and stiffness.  He was seen here last week for these same symptoms which improved after being treated with prednisone and oxycodone.  He woke this am with return of pain and stiffness.  His pain is worsened with movement, especially attempts to rotate his head to the left, but pain is also triggered by deep inspiration with radiation into the left arm, lower back and down the left leg.  He denies weakness or numbness in his lower extremities but states the left arm has felt weak for the past month. No urinary or fecal incontinence or retention.  He has an appointment with his neurosurgeon scheduled for next week.  He denies chest pain at the present and denies sob.  The history is provided by the patient.    Past Medical History:  Diagnosis Date  . Anxiety   . Bipolar 1 disorder ([place])   . Chest pain   . Chronic back pain   . Chronic chest wall pain   . Coronary artery disease   . Depression   . Diabetes mellitus    Type II  . Hallucinations    "long history of them"  . Headache(784.0)   . History of gout   . HTN (hypertension)   . Insomnia   . Neuropathy   . Pain management   . Right leg pain    chronic  . Schizophrenia ([place])   . Seizures ([place])    last sz between July 5-9th, 2016; epilepsy  . Shortness of breath dyspnea    with exertion  . Sleep apnea   . Transfusion of blood product refused for religious reason     Patient Active Problem List   Diagnosis Date Noted  . CAD (coronary artery disease) 03/28/2016  . Chest pain 03/21/2016  . Hyperlipidemia 03/11/2016  . Ischemic  chest pain   . NSTEMI (non-ST elevated myocardial infarction) ([place])   . Coronary atherosclerosis of native coronary artery 03/03/2016  . Cervical spinal stenosis 12/14/2015  . Loss of weight 08/25/2015  . Essential hypertension, benign 07/23/2015  . Personal history of noncompliance with medical treatment, presenting hazards to health 07/23/2015  . Abdominal pain 06/08/2015  . Constipation 06/08/2015  . History of colonic polyps   . Diverticulosis of colon without hemorrhage   . Mucosal abnormality of stomach   . Encounter for screening colonoscopy 04/16/2015  . Dysphagia 04/16/2015  . Partial symptomatic epilepsy with complex partial seizures, intractable, without status epilepticus ([place]) 11/10/2014  . DM type 2 causing vascular disease ([place]) 06/29/2014  . Musculoskeletal pain 06/29/2014  . Hyperglycemia without ketosis 09/07/2013  . 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 Knee pain 12/28/2010  . Medial meniscus, posterior horn derangement 12/28/2010    Past Surgical History:  Procedure Laterality Date  . ANTERIOR CERVICAL DECOMP/DISCECTOMY FUSION N/A 12/14/2015   Procedure: ANTERIOR CERVICAL DECOMPRESSION/DISCECTOMY FUSION CERVICAL FIVE -SIX;  Surgeon: Earnie Larsson, MD;  Location: [facility];  Service: Neurosurgery;  Laterality: N/A;  . BIOPSY  05/07/2015   Procedure: BIOPSY (Gastric);  Surgeon: [name],  MD;  Location: [facility];  Service: Endoscopy;;  . CARDIAC CATHETERIZATION N/A 03/07/2016   Procedure: Left Heart Cath and Coronary Angiography;  Surgeon: Belva Crome, MD;  Location: [facility];  Service: Cardiovascular;  Laterality: N/A;  . COLONOSCOPY WITH PROPOFOL N/A 05/07/2015   PPI:[id number] diverticulosis, multiple colon polyps removed, tubular adenoma, serrated colon polyp. Next colonoscopy October 2019  . CORONARY ARTERY BYPASS GRAFT N/A 03/28/2016   Procedure: CORONARY ARTERY BYPASS GRAFTING (CABG) x 5 USING GREATER SAPHENOUS VEIN;  Surgeon: [name], MD;  Location: [facility];  Service: Open Heart Surgery;  Laterality: N/A;  . ENDOVEIN HARVEST OF GREATER SAPHENOUS VEIN Right 03/28/2016   Procedure: ENDOVEIN HARVEST OF GREATER SAPHENOUS VEIN;  Surgeon: [name], MD;  Location: [facility];  Service: Open Heart Surgery;  Laterality: Right;  . ESOPHAGEAL DILATION N/A 05/07/2015   Procedure: ESOPHAGEAL DILATION WITH 56FR MALONEY DILATOR;  Surgeon: [name], MD;  [facility]: [facility];  Service: Endoscopy;  Laterality: N/A;  . ESOPHAGOGASTRODUODENOSCOPY (EGD) WITH PROPOFOL N/A 05/07/2015   RMR: Status post dilation of normal esophagus. Gastritis.  Marland Kitchen KNEE SURGERY Left    arthroscopy  . MANDIBLE FRACTURE SURGERY    . POLYPECTOMY  05/07/2015   Procedure: POLYPECTOMY (Hepatic Flexure, Distal Transverse Colon, Rectal);  Surgeon: [name], MD;  [facility]: [facility];  Service: Endoscopy;;  . TEE WITHOUT CARDIOVERSION N/A 03/28/2016   Procedure: TRANSESOPHAGEAL ECHOCARDIOGRAM (TEE);  Surgeon: [name], MD;  Location: [facility];  Service: Open Heart Surgery;  Laterality: N/A;  . TONSILLECTOMY         Home Medications    Prior to Admission medications   Medication Sig Start Date End Date Taking? Authorizing Provider  ACCU-CHEK AVIVA PLUS test strip USE AS DIRECTED TO TEST 4 TIMES DAILY. 03/29/17   Cassandria Anger, MD  amLODipine (NORVASC) 10 MG tablet Take 1 tablet (10 mg total) by mouth daily. 04/18/16   Lendon Colonel, NP  clopidogrel (PLAVIX) 75 MG tablet Take 1 tablet (75 mg total) by mouth daily. 04/18/16   Lendon Colonel, NP  divalproex (DEPAKOTE) 500 MG DR tablet Take 3 tablets (1,500 mg total) by mouth 2 (two) times daily. 01/04/17   Penumalli, Earlean Polka, MD  Doxepin HCl 5 % CREA Apply 2 g topically 3 (three) times daily. Pt applies to chest and legs.    [provider]  Insulin Glargine (LANTUS SOLOSTAR) 100 UNIT/ML Solostar Pen Inject 30 Units into the skin at bedtime. 03/03/17   Cassandria Anger, MD    isosorbide mononitrate (IMDUR) 60 MG 24 hr tablet Take 90 mg by mouth daily.    [provider]  levETIRAcetam (KEPPRA) 1000 MG tablet Take 1 tablet (1,000 mg total) by mouth 2 (two) times daily. 01/04/17   Penumalli, Earlean Polka, MD  lidocaine (LIDODERM) 5 % Place 1 patch onto the skin daily. Remove & Discard patch within 12 hours or as directed by MD Patient taking differently: Place 1 patch onto the skin daily as needed (for pain). Remove & Discard patch within 12 hours or as directed by MD 10/08/16   Ocie Cornfield T, PA-C  lidocaine (XYLOCAINE) 5 % ointment Apply 2-3 application topically 4 (four) times daily. Pt applies to chest and legs.    [provider]  methocarbamol (ROBAXIN) 500 MG tablet Take 1 tablet (500 mg total) by mouth 4 (four) times daily. 04/05/17 04/15/17  Evalee Jefferson, PA-C  metoprolol tartrate (LOPRESSOR) 25 MG  tablet Take 25 mg by mouth 2 (two) times daily.    [provider]  NONFORMULARY OR COMPOUNDED ITEM APPLY 1 GRAM (1 PUMP) TO THE AFFECTED AREA(S) UP TO 4 TIMES A DAY                        DRUG NAME: B5 D3 K10 Pent+ 03/08/17   [provider]  oxyCODONE-acetaminophen (PERCOCET/ROXICET) 5-325 MG tablet Take 1 tablet by mouth every 4 (four) hours as needed. 04/05/17   Evalee Jefferson, PA-C  predniSONE (DELTASONE) 10 MG tablet Take 2 tablets (20 mg total) by mouth daily. 03/30/17   Milton Ferguson, MD  ranolazine (RANEXA) 500 MG 12 hr tablet Take 500 mg by mouth 2 (two) times daily.    [provider]  traMADol (ULTRAM) 50 MG tablet Take 1 tablet (50 mg total) by mouth every 6 (six) hours as needed for severe pain. Patient not taking: Reported on 03/30/2017 02/02/17   Julianne Rice, MD    Family History Family History  Problem Relation Age of Onset  . Diabetes Father   . Hypertension Father   . Arthritis Unknown   . Diabetes Unknown   . Asthma Unknown   . Stroke Sister     Social History Social History  Substance Use Topics  .  Smoking status: Current Every Day Smoker    Packs/day: 0.25    Years: 30.00    Types: Cigarettes  . Smokeless tobacco: Never Used     Comment: 3 cigs per day  . Alcohol use No     Allergies   Aspirin   Review of Systems Review of Systems  Constitutional: Negative for fever.  Respiratory: Negative for chest tightness and shortness of breath.   Cardiovascular: Negative for chest pain and leg swelling.  Gastrointestinal: Negative for abdominal distention, abdominal pain and constipation.  Genitourinary: Negative for difficulty urinating, dysuria, flank pain, frequency and urgency.  Musculoskeletal: Positive for back pain and neck pain. Negative for gait problem and joint swelling.  Skin: Negative for rash.  Neurological: Positive for weakness. Negative for numbness.     Physical Exam Updated Vital Signs BP (!) 173/102 (BP Location: Right Arm)   Pulse 67   Temp 98.4 F (36.9 C) (Oral)   Resp 20   Ht 5\' 7"  (1.702 m)   Wt 69.9 kg (154 lb)   SpO2 99%   BMI 24.12 kg/m   Physical Exam  Constitutional: He appears well-developed and well-nourished.  HENT:  Head: Normocephalic.  Eyes: Conjunctivae are normal.  Neck: Muscular tenderness present. No spinous process tenderness present. Decreased range of motion present.    Cardiovascular: Normal rate and intact distal pulses.   Pedal pulses normal.  Pulmonary/Chest: Effort normal.  Abdominal: Soft. He exhibits no distension.  Musculoskeletal: He exhibits no edema.       Cervical back: He exhibits bony tenderness and spasm. He exhibits no edema and no deformity.       Lumbar back: He exhibits no tenderness, no swelling, no edema and no spasm.  Neurological: He is alert. He has normal strength. He displays no atrophy and no tremor. No sensory deficit. Gait normal.  Reflex Scores:      Bicep reflexes are 1+ on the right side and 1+ on the left side.      Patellar reflexes are 2+ on the right side and 2+ on the left side. No  strength deficit noted in hip and knee flexor and extensor  muscle groups.  Ankle flexion and extension intact. Equal grip strength.  Skin: Skin is warm and dry.  Psychiatric: He has a normal mood and affect.  Nursing note and vitals reviewed.    ED Treatments / Results  Labs (all labs ordered are listed, but only abnormal results are displayed) Labs Reviewed  I-STAT CHEM 8, ED - Abnormal; Notable for the following:       Result Value   Potassium 3.4 (*)    Creatinine, Ser 1.30 (*)    Calcium, Ion 1.13 (*)    Hemoglobin 12.6 (*)    HCT 37.0 (*)    All other components within normal limits  I-STAT TROPONIN, ED    EKG  EKG Interpretation  Date/Time:  Wednesday April 05 2017 12:31:11 EDT Ventricular Rate:  53 PR Interval:  178 QRS Duration: 82 QT Interval:  436 QTC Calculation: 409 R Axis:   -61 Text Interpretation:  Sinus bradycardia Left axis deviation Possible Inferior infarct , age undetermined Anterior infarct , age undetermined Abnormal ECG Confirmed by Milton Ferguson [phone]) on 04/05/2017 1:00:31 PM       Radiology No results found.  Procedures Procedures (including critical care time)  Medications Ordered in ED Medications  oxyCODONE-acetaminophen (PERCOCET/ROXICET) 5-325 MG per tablet 1 tablet (1 tablet Oral Given 04/05/17 1232)  methocarbamol (ROBAXIN) tablet 500 mg (500 mg Oral Given 04/05/17 1232)     Initial Impression / Assessment and Plan / ED Course  I have reviewed the triage vital signs and the nursing notes.  Pertinent labs & imaging results that were available during my care of the patient were reviewed by me and considered in my medical decision making (see chart for details).     Pt with persistent left neck and muscular spasm. Reproducible on exam.  Labs, ekg stable.   Will tx sx and plan f/u with Dr Trenton Gammon as he has arranged.       Final Clinical Impressions(s) / ED Diagnoses   Final diagnoses:  Cervical radiculopathy    New  Prescriptions New Prescriptions   METHOCARBAMOL (ROBAXIN) 500 MG TABLET    Take 1 tablet (500 mg total) by mouth 4 (four) times daily.   OXYCODONE-ACETAMINOPHEN (PERCOCET/ROXICET) 5-325 MG TABLET    Take 1 tablet by mouth every 4 (four) hours as needed.     Evalee Jefferson, PA-C 04/05/17 1308    Milton Ferguson, MD 04/06/17 [phone]

## 2017-04-06 ENCOUNTER — Ambulatory Visit: Payer: Medicaid Other | Admitting: Cardiovascular Disease

## 2017-04-07 ENCOUNTER — Ambulatory Visit: Payer: Medicaid Other | Admitting: Cardiovascular Disease

## 2017-04-14 ENCOUNTER — Emergency Department (HOSPITAL_COMMUNITY)
Admission: EM | Admit: 2017-04-14 | Discharge: 2017-04-14 | Disposition: A | Discharge status: Home / Self Care | Payer: Medicaid Other | Attending: Emergency Medicine | Admitting: Emergency Medicine

## 2017-04-14 ENCOUNTER — Encounter (HOSPITAL_COMMUNITY): Payer: Self-pay | Admitting: Emergency Medicine

## 2017-04-14 DIAGNOSIS — E118 Type 2 diabetes mellitus with unspecified complications: Secondary | ICD-10-CM | POA: Insufficient documentation

## 2017-04-14 DIAGNOSIS — I10 Essential (primary) hypertension: Secondary | ICD-10-CM | POA: Diagnosis not present

## 2017-04-14 DIAGNOSIS — F17210 Nicotine dependence, cigarettes, uncomplicated: Secondary | ICD-10-CM | POA: Insufficient documentation

## 2017-04-14 DIAGNOSIS — Z794 Long term (current) use of insulin: Secondary | ICD-10-CM | POA: Insufficient documentation

## 2017-04-14 DIAGNOSIS — M79602 Pain in left arm: Secondary | ICD-10-CM | POA: Insufficient documentation

## 2017-04-14 DIAGNOSIS — I2510 Atherosclerotic heart disease of native coronary artery without angina pectoris: Secondary | ICD-10-CM | POA: Diagnosis not present

## 2017-04-14 DIAGNOSIS — Z79899 Other long term (current) drug therapy: Secondary | ICD-10-CM | POA: Insufficient documentation

## 2017-04-14 DIAGNOSIS — Z7902 Long term (current) use of antithrombotics/antiplatelets: Secondary | ICD-10-CM | POA: Diagnosis not present

## 2017-04-14 DIAGNOSIS — Z951 Presence of aortocoronary bypass graft: Secondary | ICD-10-CM | POA: Insufficient documentation

## 2017-04-14 MED ORDER — METHYLPREDNISOLONE SODIUM SUCC 125 MG IJ SOLR
125.0000 mg | Freq: Once | INTRAMUSCULAR | Status: AC
  Administered 2017-04-14: 125 mg via INTRAMUSCULAR
  Filled 2017-04-14: qty 2

## 2017-04-14 MED ORDER — KETOROLAC TROMETHAMINE 60 MG/2ML IM SOLN
60.0000 mg | Freq: Once | INTRAMUSCULAR | Status: AC
  Administered 2017-04-14: 60 mg via INTRAMUSCULAR
  Filled 2017-04-14: qty 2

## 2017-04-14 MED ORDER — TRAMADOL HCL 50 MG PO TABS: 50.0000 mg | ORAL_TABLET | Freq: Four times a day (QID) | ORAL | 0 refills | Status: DC | PRN

## 2017-04-14 NOTE — ED Triage Notes (Signed)
Pt c/o sharp left chest/shoulder/arm pain since 0800 this am. Pt states pain is worse with movement.

## 2017-04-14 NOTE — Discharge Instructions (Signed)
Follow up with your Physician for recheck  

## 2017-04-15 NOTE — ED Provider Notes (Signed)
[facility] DEPT Provider Note   CSN: [id number] Arrival date & time: 04/14/17  3419     History   Chief Complaint Chief Complaint  Patient presents with  . Arm Pain    HPI Melvin Taylor is a 51 y.o. male.  The history is provided by the patient. No language interpreter was used.  Arm Pain  This is a new problem. The current episode started 6 to 12 hours ago. The problem occurs constantly. Nothing aggravates the symptoms. Nothing relieves the symptoms. He has tried nothing for the symptoms.  Pt complains of soreness in his shoulder.  Pt reports pain with movement   Past Medical History:  Diagnosis Date  . Anxiety   . Bipolar 1 disorder ([place])   . Chest pain   . Chronic back pain   . Chronic chest wall pain   . Coronary artery disease   . Depression   . Diabetes mellitus    Type II  . Hallucinations    "long history of them"  . Headache(784.0)   . History of gout   . HTN (hypertension)   . Insomnia   . Neuropathy   . Pain management   . Right leg pain    chronic  . Schizophrenia ([place])   . Seizures ([place])    last sz between July 5-9th, 2016; epilepsy  . Shortness of breath dyspnea    with exertion  . Sleep apnea   . Transfusion of blood product refused for religious reason     Patient Active Problem List   Diagnosis Date Noted  . CAD (coronary artery disease) 03/28/2016  . Chest pain 03/21/2016  . Hyperlipidemia 03/11/2016  . Ischemic chest pain   . NSTEMI (non-ST elevated myocardial infarction) ([place])   . Coronary atherosclerosis of native coronary artery 03/03/2016  . Cervical spinal stenosis 12/14/2015  . Loss of weight 08/25/2015  . Essential hypertension, benign 07/23/2015  . Personal history of noncompliance with medical treatment, presenting hazards to health 07/23/2015  . Abdominal pain 06/08/2015  . Constipation 06/08/2015  . History of colonic polyps   . Diverticulosis of colon without hemorrhage   . Mucosal abnormality of stomach   .  Encounter for screening colonoscopy 04/16/2015  . Dysphagia 04/16/2015  . Partial symptomatic epilepsy with complex partial seizures, intractable, without status epilepticus ([place]) 11/10/2014  . DM type 2 causing vascular disease ([place]) 06/29/2014  . Musculoskeletal pain 06/29/2014  . Hyperglycemia without ketosis 09/07/2013  . 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 Knee pain 12/28/2010  . Medial meniscus, posterior horn derangement 12/28/2010    Past Surgical History:  Procedure Laterality Date  . ANTERIOR CERVICAL DECOMP/DISCECTOMY FUSION N/A 12/14/2015   Procedure: ANTERIOR CERVICAL DECOMPRESSION/DISCECTOMY FUSION CERVICAL FIVE -SIX;  Surgeon: Earnie Larsson, MD;  Location: [facility];  Service: Neurosurgery;  Laterality: N/A;  . BIOPSY  05/07/2015   Procedure: BIOPSY (Gastric);  Surgeon: [name], MD;  Location: [facility];  Service: Endoscopy;;  . CARDIAC CATHETERIZATION N/A 03/07/2016   Procedure: Left Heart Cath and Coronary Angiography;  Surgeon: Belva Crome, MD;  Location: [facility];  Service: Cardiovascular;  Laterality: N/A;  . COLONOSCOPY WITH PROPOFOL N/A 05/07/2015   FXT:[id number] diverticulosis, multiple colon polyps removed, tubular adenoma, serrated colon polyp. Next colonoscopy October 2019  . CORONARY ARTERY BYPASS GRAFT N/A 03/28/2016   Procedure: CORONARY ARTERY BYPASS GRAFTING (CABG) x 5 USING GREATER SAPHENOUS VEIN;  Surgeon: [name], MD;  Location: [facility];  Service: Open Heart Surgery;  Laterality: N/A;  . ENDOVEIN HARVEST OF GREATER SAPHENOUS VEIN Right 03/28/2016   Procedure: ENDOVEIN HARVEST OF GREATER SAPHENOUS VEIN;  Surgeon: [name], MD;  Location: [facility];  Service: Open Heart Surgery;  Laterality: Right;  . ESOPHAGEAL DILATION N/A 05/07/2015   Procedure: ESOPHAGEAL DILATION WITH 56FR MALONEY DILATOR;  Surgeon: [name], MD;  [facility];  Service: Endoscopy;  Laterality: N/A;  . ESOPHAGOGASTRODUODENOSCOPY (EGD) WITH  PROPOFOL N/A 05/07/2015   RMR: Status post dilation of normal esophagus. Gastritis.  Marland Kitchen KNEE SURGERY Left    arthroscopy  . MANDIBLE FRACTURE SURGERY    . POLYPECTOMY  05/07/2015   Procedure: POLYPECTOMY (Hepatic Flexure, Distal Transverse Colon, Rectal);  Surgeon: [name], MD;  [facility];  Service: Endoscopy;;  . TEE WITHOUT CARDIOVERSION N/A 03/28/2016   Procedure: TRANSESOPHAGEAL ECHOCARDIOGRAM (TEE);  Surgeon: [name], MD;  Location: [facility];  Service: Open Heart Surgery;  Laterality: N/A;  . TONSILLECTOMY         Home Medications    Prior to Admission medications   Medication Sig Start Date End Date Taking? Authorizing Provider  ACCU-CHEK AVIVA PLUS test strip USE AS DIRECTED TO TEST 4 TIMES DAILY. 03/29/17  Yes Cassandria Anger, MD  amLODipine (NORVASC) 10 MG tablet Take 1 tablet (10 mg total) by mouth daily. 04/18/16  Yes Lendon Colonel, NP  clopidogrel (PLAVIX) 75 MG tablet Take 1 tablet (75 mg total) by mouth daily. 04/18/16  Yes Lendon Colonel, NP  diazepam (VALIUM) 5 MG tablet Take 5 mg by mouth every 6 (six) hours as needed for anxiety.   Yes [provider]  divalproex (DEPAKOTE) 500 MG DR tablet Take 3 tablets (1,500 mg total) by mouth 2 (two) times daily. 01/04/17  Yes Penumalli, Earlean Polka, MD  Doxepin HCl 5 % CREA Apply 2 g topically 3 (three) times daily. Pt applies to chest and legs.   Yes [provider]  Insulin Glargine (LANTUS SOLOSTAR) 100 UNIT/ML Solostar Pen Inject 30 Units into the skin at bedtime. 03/03/17  Yes Cassandria Anger, MD  isosorbide mononitrate (IMDUR) 60 MG 24 hr tablet Take 90 mg by mouth daily.   Yes [provider]  levETIRAcetam (KEPPRA) 1000 MG tablet Take 1 tablet (1,000 mg total) by mouth 2 (two) times daily. 01/04/17  Yes Penumalli, Vikram R, MD  lidocaine (LIDODERM) 5 % Place 1 patch onto the skin daily. Remove & Discard patch within 12 hours or as directed by MD Patient taking  differently: Place 1 patch onto the skin daily as needed (for pain). Remove & Discard patch within 12 hours or as directed by MD 10/08/16  Yes Leaphart, Zack Seal, PA-C  lidocaine (XYLOCAINE) 5 % ointment Apply 2-3 application topically 4 (four) times daily. Pt applies to chest and legs.   Yes [provider]  methocarbamol (ROBAXIN) 500 MG tablet Take 1 tablet (500 mg total) by mouth 4 (four) times daily. 04/05/17 04/15/17 Yes Idol, Almyra Free, PA-C  metoprolol tartrate (LOPRESSOR) 25 MG tablet Take 25 mg by mouth 2 (two) times daily.   Yes [provider]  NONFORMULARY OR COMPOUNDED ITEM APPLY 1 GRAM (1 PUMP) TO THE AFFECTED AREA(S) UP TO 4 TIMES A DAY                        DRUG NAME: B5 D3 K10 Pent+ 03/08/17  Yes [provider]  oxyCODONE-acetaminophen (PERCOCET/ROXICET)  5-325 MG tablet Take 1 tablet by mouth every 4 (four) hours as needed. 04/05/17  Yes Idol, Almyra Free, PA-C  ranolazine (RANEXA) 500 MG 12 hr tablet Take 500 mg by mouth 2 (two) times daily.   Yes [provider]  traMADol (ULTRAM) 50 MG tablet Take 1 tablet (50 mg total) by mouth every 6 (six) hours as needed for severe pain. 04/14/17   Fransico Meadow, PA-C    Family History Family History  Problem Relation Age of Onset  . Diabetes Father   . Hypertension Father   . Arthritis Unknown   . Diabetes Unknown   . Asthma Unknown   . Stroke Sister     Social History Social History  Substance Use Topics  . Smoking status: Current Every Day Smoker    Packs/day: 0.25    Years: 30.00    Types: Cigarettes  . Smokeless tobacco: Never Used     Comment: 3 cigs per day  . Alcohol use No     Allergies   Aspirin   Review of Systems Review of Systems  All other systems reviewed and are negative.    Physical Exam Updated Vital Signs BP (!) 143/96   Pulse 62   Temp 98.2 F (36.8 C) (Oral)   Resp 14   Ht 5\' 7"  (1.702 m)   Wt 69.9 kg (154 lb)   SpO2 99%   BMI 24.12 kg/m   Physical Exam    Constitutional: He appears well-developed and well-nourished.  HENT:  Head: Normocephalic.  Musculoskeletal: He exhibits tenderness.  Shoulder tender to palpation  From  nv and ns intact   Neurological: He is alert.  Skin: Skin is warm.  Psychiatric: He has a normal mood and affect.  Nursing note and vitals reviewed.    ED Treatments / Results  Labs (all labs ordered are listed, but only abnormal results are displayed) Labs Reviewed - No data to display  EKG  EKG Interpretation None       Radiology No results found.  Procedures Procedures (including critical care time)  Medications Ordered in ED Medications  methylPREDNISolone sodium succinate (SOLU-MEDROL) 125 mg/2 mL injection 125 mg (125 mg Intramuscular Given 04/14/17 1111)  ketorolac (TORADOL) injection 60 mg (60 mg Intramuscular Given 04/14/17 1111)     Initial Impression / Assessment and Plan / ED Course  I have reviewed the triage vital signs and the nursing notes.  Pertinent labs & imaging results that were available during my care of the patient were reviewed by me and considered in my medical decision making (see chart for details).       Final Clinical Impressions(s) / ED Diagnoses   Final diagnoses:  Left arm pain    New Prescriptions Discharge Medication List as of 04/14/2017 12:13 PM    An After Visit Summary was printed and given to the patient.   Fransico Meadow, [place] 04/15/17 1613    Julianne Rice, MD 04/17/17 [phone]

## 2017-04-27 ENCOUNTER — Encounter (HOSPITAL_COMMUNITY): Payer: Self-pay | Admitting: *Deleted

## 2017-04-27 ENCOUNTER — Emergency Department (HOSPITAL_COMMUNITY)
Admission: EM | Admit: 2017-04-27 | Discharge: 2017-04-27 | Disposition: A | Discharge status: Home / Self Care | Payer: Medicaid Other | Attending: Emergency Medicine | Admitting: Emergency Medicine

## 2017-04-27 DIAGNOSIS — I10 Essential (primary) hypertension: Secondary | ICD-10-CM | POA: Insufficient documentation

## 2017-04-27 DIAGNOSIS — I2510 Atherosclerotic heart disease of native coronary artery without angina pectoris: Secondary | ICD-10-CM | POA: Insufficient documentation

## 2017-04-27 DIAGNOSIS — M5412 Radiculopathy, cervical region: Secondary | ICD-10-CM

## 2017-04-27 DIAGNOSIS — E1151 Type 2 diabetes mellitus with diabetic peripheral angiopathy without gangrene: Secondary | ICD-10-CM | POA: Diagnosis not present

## 2017-04-27 DIAGNOSIS — I252 Old myocardial infarction: Secondary | ICD-10-CM | POA: Insufficient documentation

## 2017-04-27 DIAGNOSIS — Z794 Long term (current) use of insulin: Secondary | ICD-10-CM | POA: Diagnosis not present

## 2017-04-27 DIAGNOSIS — Z7902 Long term (current) use of antithrombotics/antiplatelets: Secondary | ICD-10-CM | POA: Diagnosis not present

## 2017-04-27 DIAGNOSIS — Z951 Presence of aortocoronary bypass graft: Secondary | ICD-10-CM | POA: Diagnosis not present

## 2017-04-27 DIAGNOSIS — Z87891 Personal history of nicotine dependence: Secondary | ICD-10-CM | POA: Insufficient documentation

## 2017-04-27 DIAGNOSIS — Z79899 Other long term (current) drug therapy: Secondary | ICD-10-CM | POA: Insufficient documentation

## 2017-04-27 DIAGNOSIS — M542 Cervicalgia: Secondary | ICD-10-CM | POA: Diagnosis present

## 2017-04-27 MED ORDER — METHOCARBAMOL 500 MG PO TABS: 500.0000 mg | ORAL_TABLET | Freq: Three times a day (TID) | ORAL | 0 refills | Status: DC

## 2017-04-27 MED ORDER — METHYLPREDNISOLONE SODIUM SUCC 125 MG IJ SOLR
125.0000 mg | Freq: Once | INTRAMUSCULAR | Status: AC
  Administered 2017-04-27: 125 mg via INTRAMUSCULAR
  Filled 2017-04-27: qty 2

## 2017-04-27 MED ORDER — PREDNISONE 10 MG PO TABS: ORAL_TABLET | ORAL | 0 refills | Status: DC

## 2017-04-27 MED ORDER — KETOROLAC TROMETHAMINE 60 MG/2ML IM SOLN
60.0000 mg | Freq: Once | INTRAMUSCULAR | Status: AC
  Administered 2017-04-27: 60 mg via INTRAMUSCULAR
  Filled 2017-04-27: qty 2

## 2017-04-27 NOTE — ED Triage Notes (Signed)
Here for pain control states he was given Tramadol by Dr Legrand Rams but is does no help

## 2017-04-27 NOTE — ED Triage Notes (Signed)
Pain in left side of neck radiating into side, pain is chronic, states he went to see his neurosurgeon today and the appointment was rescheduled

## 2017-04-27 NOTE — Discharge Instructions (Signed)
Alternate ice and heat to your neck. Follow-up with Dr. Annette Stable as scheduled on October 4

## 2017-04-29 NOTE — ED Provider Notes (Signed)
[facility] DEPT Provider Note   CSN: [id number] Arrival date & time: 04/27/17  1149     History   Chief Complaint Chief Complaint  Patient presents with  . Neck Pain    HPI Melvin Taylor is a 51 y.o. male.  HPI  Melvin Taylor is a 51 y.o. male who presents to the Emergency Department complaining of persistent neck pain. Patient has been seen multiple times for his chronic neck pain. He describes a sharp throbbing pain from his neck that radiates to the left side of his neck and down his shoulder and into his left arm.  He was seen here 04/14/2017 for the same. He states he was given tramadol by his PCP which has not provided any relief. States he had an appointment with his neurosurgeon prior to ER arrival, but his appointment was rescheduled till 05/04/2017. Denies any new symptoms.he also denies chest pain, numbness or weakness to his upper extremities, swelling, headaches, dizziness or vomiting.   Past Medical History:  Diagnosis Date  . Anxiety   . Bipolar 1 disorder ([place])   . Chest pain   . Chronic back pain   . Chronic chest wall pain   . Coronary artery disease   . Depression   . Diabetes mellitus    Type II  . Hallucinations    "long history of them"  . Headache(784.0)   . History of gout   . HTN (hypertension)   . Insomnia   . Neuropathy   . Pain management   . Right leg pain    chronic  . Schizophrenia ([place])   . Seizures ([place])    last sz between July 5-9th, 2016; epilepsy  . Shortness of breath dyspnea    with exertion  . Sleep apnea   . Transfusion of blood product refused for religious reason     Patient Active Problem List   Diagnosis Date Noted  . CAD (coronary artery disease) 03/28/2016  . Chest pain 03/21/2016  . Hyperlipidemia 03/11/2016  . Ischemic chest pain   . NSTEMI (non-ST elevated myocardial infarction) ([place])   . Coronary atherosclerosis of native coronary artery 03/03/2016  . Cervical spinal stenosis 12/14/2015  . Loss  of weight 08/25/2015  . Essential hypertension, benign 07/23/2015  . Personal history of noncompliance with medical treatment, presenting hazards to health 07/23/2015  . Abdominal pain 06/08/2015  . Constipation 06/08/2015  . History of colonic polyps   . Diverticulosis of colon without hemorrhage   . Mucosal abnormality of stomach   . Encounter for screening colonoscopy 04/16/2015  . Dysphagia 04/16/2015  . Partial symptomatic epilepsy with complex partial seizures, intractable, without status epilepticus ([place]) 11/10/2014  . DM type 2 causing vascular disease ([place]) 06/29/2014  . Musculoskeletal pain 06/29/2014  . Hyperglycemia without ketosis 09/07/2013  . 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 Knee pain 12/28/2010  . Medial meniscus, posterior horn derangement 12/28/2010    Past Surgical History:  Procedure Laterality Date  . ANTERIOR CERVICAL DECOMP/DISCECTOMY FUSION N/A 12/14/2015   Procedure: ANTERIOR CERVICAL DECOMPRESSION/DISCECTOMY FUSION CERVICAL FIVE -SIX;  Surgeon: Earnie Larsson, MD;  Location: [facility];  Service: Neurosurgery;  Laterality: N/A;  . BIOPSY  05/07/2015   Procedure: BIOPSY (Gastric);  Surgeon: [name], MD;  Location: [facility];  Service: Endoscopy;;  . CARDIAC CATHETERIZATION N/A 03/07/2016   Procedure: Left Heart Cath and Coronary Angiography;  Surgeon: Belva Crome, MD;  Location: [facility];  Service: Cardiovascular;  Laterality: N/A;  . COLONOSCOPY WITH PROPOFOL N/A 05/07/2015   QVZ:DGLOVFIEPP diverticulosis, multiple colon polyps removed, tubular adenoma, serrated colon polyp. Next colonoscopy October 2019  . CORONARY ARTERY BYPASS GRAFT N/A 03/28/2016   Procedure: CORONARY ARTERY BYPASS GRAFTING (CABG) x 5 USING GREATER SAPHENOUS VEIN;  Surgeon: [name], MD;  Location: [facility];  Service: Open Heart Surgery;  Laterality: N/A;  . ENDOVEIN HARVEST OF GREATER SAPHENOUS VEIN Right 03/28/2016   Procedure: ENDOVEIN HARVEST OF GREATER  SAPHENOUS VEIN;  Surgeon: [name], MD;  Location: [facility];  Service: Open Heart Surgery;  Laterality: Right;  . ESOPHAGEAL DILATION N/A 05/07/2015   Procedure: ESOPHAGEAL DILATION WITH 56FR MALONEY DILATOR;  Surgeon: [name], MD;  Location: [facility];  Service: Endoscopy;  Laterality: N/A;  . ESOPHAGOGASTRODUODENOSCOPY (EGD) WITH PROPOFOL N/A 05/07/2015   RMR: Status post dilation of normal esophagus. Gastritis.  Marland Kitchen KNEE SURGERY Left    arthroscopy  . MANDIBLE FRACTURE SURGERY    . POLYPECTOMY  05/07/2015   Procedure: POLYPECTOMY (Hepatic Flexure, Distal Transverse Colon, Rectal);  Surgeon: [name], MD;  Location: [facility];  Service: Endoscopy;;  . TEE WITHOUT CARDIOVERSION N/A 03/28/2016   Procedure: TRANSESOPHAGEAL ECHOCARDIOGRAM (TEE);  Surgeon: [name], MD;  Location: [facility];  Service: Open Heart Surgery;  Laterality: N/A;  . TONSILLECTOMY         Home Medications    Prior to Admission medications   Medication Sig Start Date End Date Taking? Authorizing Provider  ACCU-CHEK AVIVA PLUS test strip USE AS DIRECTED TO TEST 4 TIMES DAILY. 03/29/17   Cassandria Anger, MD  amLODipine (NORVASC) 10 MG tablet Take 1 tablet (10 mg total) by mouth daily. 04/18/16   Lendon Colonel, NP  clopidogrel (PLAVIX) 75 MG tablet Take 1 tablet (75 mg total) by mouth daily. 04/18/16   Lendon Colonel, NP  diazepam (VALIUM) 5 MG tablet Take 5 mg by mouth every 6 (six) hours as needed for anxiety.    [provider]  divalproex (DEPAKOTE) 500 MG DR tablet Take 3 tablets (1,500 mg total) by mouth 2 (two) times daily. 01/04/17   Penumalli, Earlean Polka, MD  Doxepin HCl 5 % CREA Apply 2 g topically 3 (three) times daily. Pt applies to chest and legs.    [provider]  Insulin Glargine (LANTUS SOLOSTAR) 100 UNIT/ML Solostar Pen Inject 30 Units into the skin at bedtime. 03/03/17   Cassandria Anger, MD  isosorbide mononitrate (IMDUR) 60 MG 24 hr tablet Take 90 mg by mouth  daily.    [provider]  levETIRAcetam (KEPPRA) 1000 MG tablet Take 1 tablet (1,000 mg total) by mouth 2 (two) times daily. 01/04/17   Penumalli, Earlean Polka, MD  lidocaine (LIDODERM) 5 % Place 1 patch onto the skin daily. Remove & Discard patch within 12 hours or as directed by MD Patient taking differently: Place 1 patch onto the skin daily as needed (for pain). Remove & Discard patch within 12 hours or as directed by MD 10/08/16   Ocie Cornfield T, PA-C  lidocaine (XYLOCAINE) 5 % ointment Apply 2-3 application topically 4 (four) times daily. Pt applies to chest and legs.    [provider]  methocarbamol (ROBAXIN) 500 MG tablet Take 1 tablet (500 mg total) by mouth 3 (three) times daily. 04/27/17   Benny Henrie, PA-C  metoprolol tartrate (LOPRESSOR) 25 MG tablet Take 25 mg by mouth 2 (two) times daily.    [provider]  NONFORMULARY OR COMPOUNDED ITEM APPLY 1 GRAM (1 PUMP) TO THE AFFECTED AREA(S) UP TO 4 TIMES A DAY                        DRUG NAME: B5 D3 K10 Pent+ 03/08/17   [provider]  oxyCODONE-acetaminophen (PERCOCET/ROXICET) 5-325 MG tablet Take 1 tablet by mouth every 4 (four) hours as needed. 04/05/17   Idol, Almyra Free, PA-C  predniSONE (DELTASONE) 10 MG tablet Take 6 tablets day one, 5 tablets day two, 4 tablets day three, 3 tablets day four, 2 tablets day five, then 1 tablet day six 04/27/17   Yaziel Brandon, PA-C  ranolazine (RANEXA) 500 MG 12 hr tablet Take 500 mg by mouth 2 (two) times daily.    [provider]  traMADol (ULTRAM) 50 MG tablet Take 1 tablet (50 mg total) by mouth every 6 (six) hours as needed for severe pain. 04/14/17   Fransico Meadow, PA-C    Family History Family History  Problem Relation Age of Onset  . Diabetes Father   . Hypertension Father   . Arthritis Unknown   . Diabetes Unknown   . Asthma Unknown   . Stroke Sister     Social History Social History  Substance Use Topics  . Smoking status: Current Every  Day Smoker    Packs/day: 0.25    Years: 30.00    Types: Cigarettes  . Smokeless tobacco: Never Used     Comment: 3 cigs per day  . Alcohol use No     Allergies   Aspirin   Review of Systems Review of Systems  Constitutional: Negative for fever.  Eyes: Negative for visual disturbance.  Respiratory: Negative for shortness of breath.   Gastrointestinal: Negative for abdominal pain, nausea and vomiting.  Genitourinary: Negative for decreased urine volume, difficulty urinating, dysuria, flank pain and hematuria.  Musculoskeletal: Positive for back pain and neck pain. Negative for joint swelling.  Skin: Negative for rash.  Neurological: Negative for dizziness, weakness, numbness and headaches.  All other systems reviewed and are negative.    Physical Exam Updated Vital Signs BP (!) 146/99   Pulse 60   Temp 98.2 F (36.8 C) (Oral)   Resp 20   Ht 5\' 7"  (1.702 m)   Wt 68 kg (150 lb)   SpO2 99%   BMI 23.49 kg/m   Physical Exam  Constitutional: He is oriented to person, place, and time. He appears well-developed and well-nourished. No distress.  HENT:  Head: Normocephalic and atraumatic.  Mouth/Throat: Oropharynx is clear and moist.  Eyes: Pupils are equal, round, and reactive to light. EOM are normal.  Neck: Normal range of motion. Neck supple.  Cardiovascular: Normal rate, regular rhythm and intact distal pulses.   No murmur heard. Pulmonary/Chest: Effort normal and breath sounds normal. No respiratory distress.  Abdominal: Soft. He exhibits no distension. There is no tenderness.  Musculoskeletal: He exhibits tenderness. He exhibits no edema.       Lumbar back: He exhibits tenderness and pain. He exhibits normal range of motion, no swelling, no deformity, no laceration and normal pulse.  ttp of the lower cervical spine and left cervical paraspinal muscles.  Pt has 5/5 strength against resistance of bilateral upper extremities.     Neurological: He is alert and oriented  to person, place, and time. He has normal strength. No sensory deficit. He exhibits normal muscle tone. Coordination and gait normal.  Reflex Scores:  Patellar reflexes are 2+ on the right side and 2+ on the left side.      Achilles reflexes are 2+ on the right side and 2+ on the left side. Skin: Skin is warm and dry. Capillary refill takes less than 2 seconds. No rash noted.  Nursing note and vitals reviewed.    ED Treatments / Results  Labs (all labs ordered are listed, but only abnormal results are displayed) Labs Reviewed - No data to display  EKG  EKG Interpretation None       Radiology No results found.  Procedures Procedures (including critical care time)  Medications Ordered in ED Medications  ketorolac (TORADOL) injection 60 mg (60 mg Intramuscular Given 04/27/17 1348)  methylPREDNISolone sodium succinate (SOLU-MEDROL) 125 mg/2 mL injection 125 mg (125 mg Intramuscular Given 04/27/17 1349)     Initial Impression / Assessment and Plan / ED Course  I have reviewed the triage vital signs and the nursing notes.  Pertinent labs & imaging results that were available during my care of the patient were reviewed by me and considered in my medical decision making (see chart for details).     Patient had complete C-spine films from 03/30/2017 that showed postsurgical changes and stable retrolisthesis of C5 versus C6 No focal neurological deficits on exam. Pain is chronic. Do not feel that narcotic pain medications are indicated at this time. Patient appears stable for discharge will treat with muscle relaxer and prednisone. He does have an appointment card for [facility] neurosurgery for 05/04/2017 advised to keep his appointment as scheduled.  Final Clinical Impressions(s) / ED Diagnoses   Final diagnoses:  Cervical radiculopathy    New Prescriptions Discharge Medication List as of 04/27/2017  1:52 PM    START taking these medications   Details  methocarbamol  (ROBAXIN) 500 MG tablet Take 1 tablet (500 mg total) by mouth 3 (three) times daily., Starting Thu 04/27/2017, Print    predniSONE (DELTASONE) 10 MG tablet Take 6 tablets day one, 5 tablets day two, 4 tablets day three, 3 tablets day four, 2 tablets day five, then 1 tablet day six, Print         Kem Parkinson, PA-C 04/29/17 0950    Forde Dandy, MD 05/04/17 1410

## 2017-05-15 ENCOUNTER — Encounter (HOSPITAL_COMMUNITY): Payer: Self-pay

## 2017-05-15 ENCOUNTER — Emergency Department (HOSPITAL_COMMUNITY): Payer: Medicaid Other

## 2017-05-15 ENCOUNTER — Emergency Department (HOSPITAL_COMMUNITY)
Admission: EM | Admit: 2017-05-15 | Discharge: 2017-05-15 | Disposition: A | Discharge status: Home / Self Care | Payer: Medicaid Other | Attending: Emergency Medicine | Admitting: Emergency Medicine

## 2017-05-15 DIAGNOSIS — I2510 Atherosclerotic heart disease of native coronary artery without angina pectoris: Secondary | ICD-10-CM | POA: Diagnosis not present

## 2017-05-15 DIAGNOSIS — Z794 Long term (current) use of insulin: Secondary | ICD-10-CM | POA: Diagnosis not present

## 2017-05-15 DIAGNOSIS — R531 Weakness: Secondary | ICD-10-CM | POA: Diagnosis not present

## 2017-05-15 DIAGNOSIS — R200 Anesthesia of skin: Secondary | ICD-10-CM | POA: Diagnosis not present

## 2017-05-15 DIAGNOSIS — I252 Old myocardial infarction: Secondary | ICD-10-CM | POA: Diagnosis not present

## 2017-05-15 DIAGNOSIS — Z951 Presence of aortocoronary bypass graft: Secondary | ICD-10-CM | POA: Insufficient documentation

## 2017-05-15 DIAGNOSIS — Z79899 Other long term (current) drug therapy: Secondary | ICD-10-CM | POA: Insufficient documentation

## 2017-05-15 DIAGNOSIS — I10 Essential (primary) hypertension: Secondary | ICD-10-CM | POA: Diagnosis not present

## 2017-05-15 DIAGNOSIS — R079 Chest pain, unspecified: Secondary | ICD-10-CM | POA: Diagnosis present

## 2017-05-15 DIAGNOSIS — M5412 Radiculopathy, cervical region: Secondary | ICD-10-CM | POA: Insufficient documentation

## 2017-05-15 DIAGNOSIS — F17210 Nicotine dependence, cigarettes, uncomplicated: Secondary | ICD-10-CM | POA: Diagnosis not present

## 2017-05-15 DIAGNOSIS — E119 Type 2 diabetes mellitus without complications: Secondary | ICD-10-CM | POA: Diagnosis not present

## 2017-05-15 LAB — CBC
HCT: 36 % — ABNORMAL LOW (ref 39.0–52.0)
Hemoglobin: 12.9 g/dL — ABNORMAL LOW (ref 13.0–17.0)
MCH: 31.8 pg (ref 26.0–34.0)
MCHC: 35.8 g/dL (ref 30.0–36.0)
MCV: 88.7 fL (ref 78.0–100.0)
PLATELETS: 146 10*3/uL — AB (ref 150–400)
RBC: 4.06 MIL/uL — ABNORMAL LOW (ref 4.22–5.81)
RDW: 13 % (ref 11.5–15.5)
WBC: 6.1 10*3/uL (ref 4.0–10.5)

## 2017-05-15 LAB — BASIC METABOLIC PANEL
Anion gap: 8 (ref 5–15)
BUN: 14 mg/dL (ref 6–20)
CHLORIDE: 104 mmol/L (ref 101–111)
CO2: 27 mmol/L (ref 22–32)
CREATININE: 1.2 mg/dL (ref 0.61–1.24)
Calcium: 8.7 mg/dL — ABNORMAL LOW (ref 8.9–10.3)
GFR calc Af Amer: >60 mL/min (ref >60)
GFR calc non Af Amer: >60 mL/min (ref >60)
Glucose, Bld: 124 mg/dL — ABNORMAL HIGH (ref 65–99)
Potassium: 3.7 mmol/L (ref 3.5–5.1)
SODIUM: 139 mmol/L (ref 135–145)

## 2017-05-15 LAB — I-STAT TROPONIN, ED: Troponin i, poc: 0 ng/mL (ref 0.00–0.08)

## 2017-05-15 MED ORDER — PREDNISONE 50 MG PO TABS
60.0000 mg | ORAL_TABLET | Freq: Once | ORAL | Status: AC
  Administered 2017-05-15: 60 mg via ORAL
  Filled 2017-05-15: qty 1

## 2017-05-15 MED ORDER — CYCLOBENZAPRINE HCL 10 MG PO TABS: 10.0000 mg | ORAL_TABLET | Freq: Two times a day (BID) | ORAL | 0 refills | Status: DC | PRN

## 2017-05-15 MED ORDER — KETOROLAC TROMETHAMINE 60 MG/2ML IM SOLN
60.0000 mg | Freq: Once | INTRAMUSCULAR | Status: AC
  Administered 2017-05-15: 60 mg via INTRAMUSCULAR
  Filled 2017-05-15: qty 2

## 2017-05-15 MED ORDER — PREDNISONE 10 MG PO TABS: 40.0000 mg | ORAL_TABLET | Freq: Every day | ORAL | 0 refills | Status: DC

## 2017-05-15 NOTE — Discharge Instructions (Signed)
Take the prednisone as directed. Take the Flexeril as directed. Negative follow-up appointment with your neurosurgeon.

## 2017-05-15 NOTE — ED Triage Notes (Signed)
Pt is complaining of neck pain that starts in the back of his neck that shoots down the whole left side of his body. Pt is also complaining of chest pain that is stabbing. This is new onset. Pt states chest pain and neck pain is a 10/10.

## 2017-05-15 NOTE — ED Notes (Signed)
States he thinks pain is related to the weather. C/o multiple sites of pain, neck back, chest.

## 2017-05-15 NOTE — ED Provider Notes (Signed)
[facility] EMERGENCY DEPARTMENT Provider Note   CSN: [id number] Arrival date & time: 05/15/17  1052     History   Chief Complaint Chief Complaint  Patient presents with  . Chest Pain    HPI Melvin Taylor is a 51 y.o. male.  Patient seen September 27 for neck pain radiating to the arm. Patient here for recurrent symptoms. Also now has left anterior chest pain. The chest pains been present for days. Seems to be related to the pain in the left arm.  Patient has been seen by his neurosurgeon since the last visit to the ED and they were planning on an outpatient MRI. Patient has not been able to get back with him to get that scheduled.  Patient states that the pain also radiates into the left leg. States that for the past 4 months she's had weakness in his left hand. Patient is status post cervical operation for similar symptoms urine half ago.  In addition patient reports intermittent numbness to the left hand.      Past Medical History:  Diagnosis Date  . Anxiety   . Bipolar 1 disorder ([place])   . Chest pain   . Chronic back pain   . Chronic chest wall pain   . Coronary artery disease   . Depression   . Diabetes mellitus    Type II  . Hallucinations    "long history of them"  . Headache(784.0)   . History of gout   . HTN (hypertension)   . Insomnia   . Neuropathy   . Pain management   . Right leg pain    chronic  . Schizophrenia ([place])   . Seizures ([place])    last sz between July 5-9th, 2016; epilepsy  . Shortness of breath dyspnea    with exertion  . Sleep apnea   . Transfusion of blood product refused for religious reason     Patient Active Problem List   Diagnosis Date Noted  . CAD (coronary artery disease) 03/28/2016  . Chest pain 03/21/2016  . Hyperlipidemia 03/11/2016  . Ischemic chest pain   . NSTEMI (non-ST elevated myocardial infarction) ([place])   . Coronary atherosclerosis of native coronary artery 03/03/2016  . Cervical spinal stenosis  12/14/2015  . Loss of weight 08/25/2015  . Essential hypertension, benign 07/23/2015  . Personal history of noncompliance with medical treatment, presenting hazards to health 07/23/2015  . Abdominal pain 06/08/2015  . Constipation 06/08/2015  . History of colonic polyps   . Diverticulosis of colon without hemorrhage   . Mucosal abnormality of stomach   . Encounter for screening colonoscopy 04/16/2015  . Dysphagia 04/16/2015  . Partial symptomatic epilepsy with complex partial seizures, intractable, without status epilepticus ([place]) 11/10/2014  . DM type 2 causing vascular disease ([place]) 06/29/2014  . Musculoskeletal pain 06/29/2014  . Hyperglycemia without ketosis 09/07/2013  . 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 Knee pain 12/28/2010  . Medial meniscus, posterior horn derangement 12/28/2010    Past Surgical History:  Procedure Laterality Date  . ANTERIOR CERVICAL DECOMP/DISCECTOMY FUSION N/A 12/14/2015   Procedure: ANTERIOR CERVICAL DECOMPRESSION/DISCECTOMY FUSION CERVICAL FIVE -SIX;  Surgeon: Earnie Larsson, MD;  Location: [place] NEURO ORS;  Service: Neurosurgery;  Laterality: N/A;  . BIOPSY  05/07/2015   Procedure: BIOPSY (Gastric);  Surgeon: [name], MD;  Location: [facility];  Service: Endoscopy;;  . CARDIAC CATHETERIZATION N/A 03/07/2016   Procedure: Left Heart Cath and Coronary Angiography;  Surgeon: Belva Crome, MD;  Location: [place] [facility];  Service: Cardiovascular;  Laterality: N/A;  . COLONOSCOPY WITH PROPOFOL N/A 05/07/2015   WUJ:[id number] diverticulosis, multiple colon polyps removed, tubular adenoma, serrated colon polyp. Next colonoscopy October 2019  . CORONARY ARTERY BYPASS GRAFT N/A 03/28/2016   Procedure: CORONARY ARTERY BYPASS GRAFTING (CABG) x 5 USING GREATER SAPHENOUS VEIN;  Surgeon: [name], MD;  Location: [place] OR;  Service: Open Heart Surgery;  Laterality: N/A;  . ENDOVEIN HARVEST OF GREATER SAPHENOUS VEIN Right 03/28/2016   Procedure: ENDOVEIN  HARVEST OF GREATER SAPHENOUS VEIN;  Surgeon: [name], MD;  Location: [place];  Service: Open Heart Surgery;  Laterality: Right;  . ESOPHAGEAL DILATION N/A 05/07/2015   Procedure: ESOPHAGEAL DILATION WITH 56FR MALONEY DILATOR;  Surgeon: [name], MD;  Location: [facility];  Service: Endoscopy;  Laterality: N/A;  . ESOPHAGOGASTRODUODENOSCOPY (EGD) WITH PROPOFOL N/A 05/07/2015   RMR: Status post dilation of normal esophagus. Gastritis.  Marland Kitchen KNEE SURGERY Left    arthroscopy  . MANDIBLE FRACTURE SURGERY    . POLYPECTOMY  05/07/2015   Procedure: POLYPECTOMY (Hepatic Flexure, Distal Transverse Colon, Rectal);  Surgeon: [name], MD;  Location: [facility];  Service: Endoscopy;;  . TEE WITHOUT CARDIOVERSION N/A 03/28/2016   Procedure: TRANSESOPHAGEAL ECHOCARDIOGRAM (TEE);  Surgeon: [name], MD;  Location: [place];  Service: Open Heart Surgery;  Laterality: N/A;  . TONSILLECTOMY         Home Medications    Prior to Admission medications   Medication Sig Start Date End Date Taking? Authorizing Provider  ACCU-CHEK AVIVA PLUS test strip USE AS DIRECTED TO TEST 4 TIMES DAILY. 03/29/17  Yes Cassandria Anger, MD  amLODipine (NORVASC) 10 MG tablet Take 1 tablet (10 mg total) by mouth daily. 04/18/16  Yes Lendon Colonel, NP  clopidogrel (PLAVIX) 75 MG tablet Take 1 tablet (75 mg total) by mouth daily. 04/18/16  Yes Lendon Colonel, NP  diazepam (VALIUM) 5 MG tablet Take 5 mg by mouth every 6 (six) hours as needed for anxiety.   Yes [provider]  divalproex (DEPAKOTE) 500 MG DR tablet Take 3 tablets (1,500 mg total) by mouth 2 (two) times daily. 01/04/17  Yes Penumalli, Earlean Polka, MD  Doxepin HCl 5 % CREA Apply 2 g topically 3 (three) times daily. Pt applies to chest and legs.   Yes [provider]  Insulin Glargine (LANTUS SOLOSTAR) 100 UNIT/ML Solostar Pen Inject 30 Units into the skin at bedtime. 03/03/17  Yes Cassandria Anger, MD  isosorbide mononitrate (IMDUR)  60 MG 24 hr tablet Take 90 mg by mouth daily.   Yes [provider]  levETIRAcetam (KEPPRA) 1000 MG tablet Take 1 tablet (1,000 mg total) by mouth 2 (two) times daily. 01/04/17  Yes Penumalli, Vikram R, MD  lidocaine (XYLOCAINE) 5 % ointment Apply 2-3 application topically 4 (four) times daily. Pt applies to chest and legs.   Yes [provider]  metoprolol tartrate (LOPRESSOR) 25 MG tablet Take 25 mg by mouth 2 (two) times daily.   Yes [provider]  NONFORMULARY OR COMPOUNDED ITEM APPLY 1 GRAM (1 PUMP) TO THE AFFECTED AREA(S) UP TO 4 TIMES A DAY                        DRUG NAME: B5 D3 K10 Pent+ 03/08/17  Yes [provider]  ranolazine (RANEXA) 500 MG 12 hr tablet Take 500 mg by mouth 2 (two) times  daily.   Yes [provider]  traMADol (ULTRAM) 50 MG tablet Take 1 tablet (50 mg total) by mouth every 6 (six) hours as needed for severe pain. 04/14/17  Yes Caryl Ada K, PA-C  cyclobenzaprine (FLEXERIL) 10 MG tablet Take 1 tablet (10 mg total) by mouth 2 (two) times daily as needed for muscle spasms. 05/15/17   Fredia Sorrow, MD  lidocaine (LIDODERM) 5 % Place 1 patch onto the skin daily. Remove & Discard patch within 12 hours or as directed by MD Patient not taking: Reported on 05/15/2017 10/08/16   Ocie Cornfield T, PA-C  methocarbamol (ROBAXIN) 500 MG tablet Take 1 tablet (500 mg total) by mouth 3 (three) times daily. Patient not taking: Reported on 05/15/2017 04/27/17   Triplett, Lynelle Smoke, PA-C  oxyCODONE-acetaminophen (PERCOCET/ROXICET) 5-325 MG tablet Take 1 tablet by mouth every 4 (four) hours as needed. Patient not taking: Reported on 05/15/2017 04/05/17   Evalee Jefferson, PA-C  predniSONE (DELTASONE) 10 MG tablet Take 6 tablets day one, 5 tablets day two, 4 tablets day three, 3 tablets day four, 2 tablets day five, then 1 tablet day six Patient not taking: Reported on 05/15/2017 04/27/17   Triplett, Tammy, PA-C  predniSONE (DELTASONE) 10 MG tablet Take 4  tablets (40 mg total) by mouth daily. 05/15/17   Fredia Sorrow, MD    Family History Family History  Problem Relation Age of Onset  . Diabetes Father   . Hypertension Father   . Arthritis Unknown   . Diabetes Unknown   . Asthma Unknown   . Stroke Sister     Social History Social History  Substance Use Topics  . Smoking status: Current Every Day Smoker    Packs/day: 0.25    Years: 30.00    Types: Cigarettes  . Smokeless tobacco: Never Used     Comment: 3 cigs per day  . Alcohol use No     Allergies   Aspirin   Review of Systems Review of Systems  Constitutional: Negative for fever.  HENT: Negative for congestion.   Respiratory: Negative for shortness of breath.   Cardiovascular: Positive for chest pain.  Gastrointestinal: Negative for abdominal pain, nausea and vomiting.  Genitourinary: Negative for difficulty urinating.  Musculoskeletal: Negative for back pain.  Skin: Negative for rash.  Neurological: Positive for weakness and numbness.  Hematological: Does not bruise/bleed easily.  Psychiatric/Behavioral: Negative for confusion.     Physical Exam Updated Vital Signs BP (!) 164/110   Pulse 74   Temp 98.8 F (37.1 C) (Oral)   Resp 16   Ht 1.702 m (5\' 7" )   Wt 68 kg (150 lb)   SpO2 (!) 76%   BMI 23.49 kg/m   Physical Exam  Constitutional: He is oriented to person, place, and time. He appears well-developed and well-nourished.  HENT:  Head: Normocephalic and atraumatic.  Mouth/Throat: Oropharynx is clear and moist.  Eyes: Pupils are equal, round, and reactive to light. Conjunctivae and EOM are normal.  Neck: Neck supple.  Cardiovascular: Normal rate, regular rhythm and normal heart sounds.   Pulmonary/Chest: Effort normal and breath sounds normal.  Abdominal: Soft. Bowel sounds are normal. There is no tenderness.  Musculoskeletal: Normal range of motion. He exhibits no edema.  Neurological: He is alert and oriented to person, place, and time. No  cranial nerve deficit or sensory deficit.  Slight weakness to left hand particularly with grip  Skin: Skin is warm.  Nursing note and vitals reviewed.    ED Treatments /  Results  Labs (all labs ordered are listed, but only abnormal results are displayed) Labs Reviewed  BASIC METABOLIC PANEL - Abnormal; Notable for the following:       Result Value   Glucose, Bld 124 (*)    Calcium 8.7 (*)    All other components within normal limits  CBC - Abnormal; Notable for the following:    RBC 4.06 (*)    Hemoglobin 12.9 (*)    HCT 36.0 (*)    Platelets 146 (*)    All other components within normal limits  I-STAT TROPONIN, ED    EKG  EKG Interpretation  Date/Time:  Monday May 15 2017 11:06:11 EDT Ventricular Rate:  64 PR Interval:    QRS Duration: 82 QT Interval:  398 QTC Calculation: 411 R Axis:   -39 Text Interpretation:  Sinus rhythm Left axis deviation Anterior infarct, old Baseline wander in lead(s) V1 No significant change since last tracing Interpretation limited secondary to artifact Confirmed by Fredia Sorrow [phone]) on 05/15/2017 12:16:58 PM       Radiology Dg Chest 2 View  Result Date: 05/15/2017 CLINICAL DATA:  Left side chest pain. EXAM: CHEST  2 VIEW COMPARISON:  03/12/2017 FINDINGS: Prior CABG. Heart and mediastinal contours are within normal limits. No focal opacities or effusions. No acute bony abnormality. IMPRESSION: No active cardiopulmonary disease. Electronically Signed   By: Rolm Baptise M.D.   On: 05/15/2017 11:57   Mr Cervical Spine Wo Contrast  Result Date: 05/15/2017 CLINICAL DATA:  51 y/o M; neck pain extending to the left side of body. EXAM: MRI CERVICAL SPINE WITHOUT CONTRAST TECHNIQUE: Multiplanar, multisequence MR imaging of the cervical spine was performed. No intravenous contrast was administered. COMPARISON:  03/12/2014 cervical spine CT. 01/21/2008 cervical spine MRI. FINDINGS: Alignment: Mild reversal of cervical curvature at the C4-5  level. No listhesis. Vertebrae: C5-6 anterior cervical discectomy and fusion. Susceptibility artifact from fusion hardware partially obscures the vertebral bodies. No evidence for fracture, diskitis, or suspicious bone lesion. Cord: Normal signal and morphology. Posterior Fossa, vertebral arteries, paraspinal tissues: Increased signal within the left internal jugular vein on T2 with normal gradient signal, probably due to slow flow. Disc levels: C2-3: No significant disc displacement, foraminal stenosis, or canal stenosis. C3-4: Right-sided uncovertebral and facet hypertrophy with mild foraminal stenosis. No significant canal stenosis. C4-5: No significant disc displacement, foraminal stenosis, or canal stenosis. C5-6: Left-greater-than-right uncovertebral and facet hypertrophy with mild right and moderate left foraminal stenosis. No significant canal stenosis. C6-7: No significant disc displacement, foraminal stenosis, or canal stenosis. C7-T1: No significant disc displacement, foraminal stenosis, or canal stenosis. IMPRESSION: 1. C5-6 anterior cervical discectomy and fusion. No acute osseous abnormality identified. 2. Mild reversal cervical curvature.  No listhesis. 3. No significant canal stenosis. 4. No abnormal cord signal. 5. Mild right C3-4 and C5-6 foraminal stenosis. Moderate left C5-6 foraminal stenosis. Electronically Signed   By: Kristine Garbe M.D.   On: 05/15/2017 14:18    Procedures Procedures (including critical care time)  Medications Ordered in ED Medications  predniSONE (DELTASONE) tablet 60 mg (not administered)  ketorolac (TORADOL) injection 60 mg (60 mg Intramuscular Given 05/15/17 1335)     Initial Impression / Assessment and Plan / ED Course  I have reviewed the triage vital signs and the nursing notes.  Pertinent labs & imaging results that were available during my care of the patient were reviewed by me and considered in my medical decision making (see chart for  details).    Patient  with long-standing history of some cervical radiculopathy it sounds like for the past 4-5 months. Patient seen here treated with steroids. MRI done today which show moderate narrowing on the left side at C5-C6. Shows no evidence of any cord abnormalities. Do not have an explanation for the pain in the left leg unless from a lumbar source. Patient received Toradol here. Patient will be treated with prednisone at home and Flexeril and follow back up with neurosurgery.   Final Clinical Impressions(s) / ED Diagnoses   Final diagnoses:  Cervical radiculopathy    New Prescriptions New Prescriptions   CYCLOBENZAPRINE (FLEXERIL) 10 MG TABLET    Take 1 tablet (10 mg total) by mouth 2 (two) times daily as needed for muscle spasms.   PREDNISONE (DELTASONE) 10 MG TABLET    Take 4 tablets (40 mg total) by mouth daily.     Fredia Sorrow, MD 05/15/17 1536

## 2017-05-30 ENCOUNTER — Other Ambulatory Visit: Payer: Self-pay | Admitting: Cardiology

## 2017-05-30 ENCOUNTER — Encounter (HOSPITAL_COMMUNITY): Payer: Self-pay | Admitting: Emergency Medicine

## 2017-05-30 ENCOUNTER — Emergency Department (HOSPITAL_COMMUNITY)
Admission: EM | Admit: 2017-05-30 | Discharge: 2017-05-30 | Disposition: A | Discharge status: Home / Self Care | Payer: Medicaid Other | Attending: Emergency Medicine | Admitting: Emergency Medicine

## 2017-05-30 DIAGNOSIS — M5412 Radiculopathy, cervical region: Secondary | ICD-10-CM | POA: Insufficient documentation

## 2017-05-30 DIAGNOSIS — R200 Anesthesia of skin: Secondary | ICD-10-CM | POA: Insufficient documentation

## 2017-05-30 DIAGNOSIS — Z7901 Long term (current) use of anticoagulants: Secondary | ICD-10-CM | POA: Diagnosis not present

## 2017-05-30 DIAGNOSIS — F17210 Nicotine dependence, cigarettes, uncomplicated: Secondary | ICD-10-CM | POA: Diagnosis not present

## 2017-05-30 DIAGNOSIS — Z794 Long term (current) use of insulin: Secondary | ICD-10-CM | POA: Diagnosis not present

## 2017-05-30 DIAGNOSIS — Z79899 Other long term (current) drug therapy: Secondary | ICD-10-CM | POA: Diagnosis not present

## 2017-05-30 DIAGNOSIS — E119 Type 2 diabetes mellitus without complications: Secondary | ICD-10-CM | POA: Diagnosis not present

## 2017-05-30 DIAGNOSIS — G8929 Other chronic pain: Secondary | ICD-10-CM | POA: Diagnosis not present

## 2017-05-30 DIAGNOSIS — I10 Essential (primary) hypertension: Secondary | ICD-10-CM | POA: Insufficient documentation

## 2017-05-30 DIAGNOSIS — I252 Old myocardial infarction: Secondary | ICD-10-CM | POA: Diagnosis not present

## 2017-05-30 DIAGNOSIS — M542 Cervicalgia: Secondary | ICD-10-CM | POA: Diagnosis present

## 2017-05-30 DIAGNOSIS — I2510 Atherosclerotic heart disease of native coronary artery without angina pectoris: Secondary | ICD-10-CM | POA: Insufficient documentation

## 2017-05-30 HISTORY — DX: Cervicalgia: M54.2

## 2017-05-30 HISTORY — DX: Radiculopathy, cervical region: M54.12

## 2017-05-30 HISTORY — DX: Other chronic pain: G89.29

## 2017-05-30 MED ORDER — OXYCODONE-ACETAMINOPHEN 5-325 MG PO TABS: 1.0000 | ORAL_TABLET | ORAL | 0 refills | Status: DC | PRN

## 2017-05-30 MED ORDER — KETOROLAC TROMETHAMINE 60 MG/2ML IM SOLN
60.0000 mg | Freq: Once | INTRAMUSCULAR | Status: AC
  Administered 2017-05-30: 60 mg via INTRAMUSCULAR
  Filled 2017-05-30: qty 2

## 2017-05-30 MED ORDER — OXYCODONE-ACETAMINOPHEN 5-325 MG PO TABS
1.0000 | ORAL_TABLET | Freq: Once | ORAL | Status: AC
  Administered 2017-05-30: 1 via ORAL
  Filled 2017-05-30: qty 1

## 2017-05-30 NOTE — Discharge Instructions (Signed)
Alternate ice and heat to your neck.  Be sure to keep your appt with Dr. Annette Stable for next Monday

## 2017-05-30 NOTE — ED Triage Notes (Signed)
Pt c/o neck stiffness, lower back pain, headache, left arm numbness since 0400 this morning. Pt reports hx of neck surgery 1 week ago.

## 2017-05-30 NOTE — ED Provider Notes (Signed)
[facility] Provider Note   CSN: [id number] Arrival date & time: 05/30/17  6295     History   Chief Complaint Chief Complaint  Patient presents with  . Numbness    left arm  . Neck Pain    HPI Melvin Taylor is a 51 y.o. male.  HPI  Melvin Taylor is a 51 y.o. male who presents to the Emergency Department complaining of persistent left neck and arm pain. Pain is worse this morning and also woke up with a frontal headache.   He has been seen in the ED multiple times for same.  Last seen here on October 15.  Treated with steroids and flexeril which he states do not help his pain.  He describes a sharp stabbing pain from the left side of his neck that radiates down the left arm.  Symptoms are associated with intermittent numbness and tingling to his arm as well.  Pain is worse with left arm movement and rotation of his neck.  He also complains of pain to his left lower back and down the left leg which is also chronic and persisting for months.  He states he has an appt with his neurosurgeon on Monday Nov 5.  He received a MRI of the C spine on his last ED visit which showed some narrowing at C 5-6.  He denies recent injury or new symptoms.  Denies shortness of breath, chest pain, visual changes, fever and vomiting.   Past Medical History:  Diagnosis Date  . Anxiety   . Bipolar 1 disorder ([place])   . Cervical radiculopathy   . Chest pain   . Chronic back pain   . Chronic chest wall pain   . Chronic neck pain   . Coronary artery disease   . Depression   . Diabetes mellitus    Type II  . Hallucinations    "long history of them"  . Headache(784.0)   . History of gout   . HTN (hypertension)   . Insomnia   . Neuropathy   . Pain management   . Right leg pain    chronic  . Schizophrenia ([place])   . Seizures ([place])    last sz between July 5-9th, 2016; epilepsy  . Shortness of breath dyspnea    with exertion  . Sleep apnea   . Transfusion of blood  product refused for religious reason     Patient Active Problem List   Diagnosis Date Noted  . CAD (coronary artery disease) 03/28/2016  . Chest pain 03/21/2016  . Hyperlipidemia 03/11/2016  . Ischemic chest pain   . NSTEMI (non-ST elevated myocardial infarction) ([place])   . Coronary atherosclerosis of native coronary artery 03/03/2016  . Cervical spinal stenosis 12/14/2015  . Loss of weight 08/25/2015  . Essential hypertension, benign 07/23/2015  . Personal history of noncompliance with medical treatment, presenting hazards to health 07/23/2015  . Abdominal pain 06/08/2015  . Constipation 06/08/2015  . History of colonic polyps   . Diverticulosis of colon without hemorrhage   . Mucosal abnormality of stomach   . Encounter for screening colonoscopy 04/16/2015  . Dysphagia 04/16/2015  . Partial symptomatic epilepsy with complex partial seizures, intractable, without status epilepticus ([place]) 11/10/2014  . DM type 2 causing vascular disease ([place]) 06/29/2014  . Musculoskeletal pain 06/29/2014  . Hyperglycemia without ketosis 09/07/2013  . 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 Knee pain 12/28/2010  . Medial meniscus, posterior horn derangement 12/28/2010  Past Surgical History:  Procedure Laterality Date  . ANTERIOR CERVICAL DECOMP/DISCECTOMY FUSION N/A 12/14/2015   Procedure: ANTERIOR CERVICAL DECOMPRESSION/DISCECTOMY FUSION CERVICAL FIVE -SIX;  Surgeon: Earnie Larsson, MD;  Location: [facility];  Service: Neurosurgery;  Laterality: N/A;  . BIOPSY  05/07/2015   Procedure: BIOPSY (Gastric);  Surgeon: [name], MD;  Location: [facility];  Service: Endoscopy;;  . CARDIAC CATHETERIZATION N/A 03/07/2016   Procedure: Left Heart Cath and Coronary Angiography;  Surgeon: Belva Crome, MD;  Location: [facility];  Service: Cardiovascular;  Laterality: N/A;  . COLONOSCOPY WITH PROPOFOL N/A 05/07/2015   HYQ:MVHQIONGEX diverticulosis, multiple colon polyps removed, tubular  adenoma, serrated colon polyp. Next colonoscopy October 2019  . CORONARY ARTERY BYPASS GRAFT N/A 03/28/2016   Procedure: CORONARY ARTERY BYPASS GRAFTING (CABG) x 5 USING GREATER SAPHENOUS VEIN;  Surgeon: [name], MD;  Location: [facility];  Service: Open Heart Surgery;  Laterality: N/A;  . ENDOVEIN HARVEST OF GREATER SAPHENOUS VEIN Right 03/28/2016   Procedure: ENDOVEIN HARVEST OF GREATER SAPHENOUS VEIN;  Surgeon: [name], MD;  Location: [facility];  Service: Open Heart Surgery;  Laterality: Right;  . ESOPHAGEAL DILATION N/A 05/07/2015   Procedure: ESOPHAGEAL DILATION WITH 56FR MALONEY DILATOR;  Surgeon: [name], MD;  Location: [facility];  Service: Endoscopy;  Laterality: N/A;  . ESOPHAGOGASTRODUODENOSCOPY (EGD) WITH PROPOFOL N/A 05/07/2015   RMR: Status post dilation of normal esophagus. Gastritis.  Marland Kitchen KNEE SURGERY Left    arthroscopy  . MANDIBLE FRACTURE SURGERY    . POLYPECTOMY  05/07/2015   Procedure: POLYPECTOMY (Hepatic Flexure, Distal Transverse Colon, Rectal);  Surgeon: [name], MD;  Location: [facility];  Service: Endoscopy;;  . TEE WITHOUT CARDIOVERSION N/A 03/28/2016   Procedure: TRANSESOPHAGEAL ECHOCARDIOGRAM (TEE);  Surgeon: [name], MD;  Location: [facility];  Service: Open Heart Surgery;  Laterality: N/A;  . TONSILLECTOMY         Home Medications    Prior to Admission medications   Medication Sig Start Date End Date Taking? Authorizing Provider  ACCU-CHEK AVIVA PLUS test strip USE AS DIRECTED TO TEST 4 TIMES DAILY. 03/29/17   Cassandria Anger, MD  amLODipine (NORVASC) 10 MG tablet Take 1 tablet (10 mg total) by mouth daily. 04/18/16   Lendon Colonel, NP  clopidogrel (PLAVIX) 75 MG tablet Take 1 tablet (75 mg total) by mouth daily. 04/18/16   Lendon Colonel, NP  cyclobenzaprine (FLEXERIL) 10 MG tablet Take 1 tablet (10 mg total) by mouth 2 (two) times daily as needed for muscle spasms. 05/15/17   Fredia Sorrow, MD  diazepam (VALIUM) 5 MG tablet Take 5  mg by mouth every 6 (six) hours as needed for anxiety.    [provider]  divalproex (DEPAKOTE) 500 MG DR tablet Take 3 tablets (1,500 mg total) by mouth 2 (two) times daily. 01/04/17   Penumalli, Earlean Polka, MD  Doxepin HCl 5 % CREA Apply 2 g topically 3 (three) times daily. Pt applies to chest and legs.    [provider]  Insulin Glargine (LANTUS SOLOSTAR) 100 UNIT/ML Solostar Pen Inject 30 Units into the skin at bedtime. 03/03/17   Cassandria Anger, MD  isosorbide mononitrate (IMDUR) 60 MG 24 hr tablet Take 90 mg by mouth daily.    [provider]  levETIRAcetam (KEPPRA) 1000 MG tablet Take 1 tablet (1,000 mg total) by mouth 2 (two) times daily. 01/04/17   Penumalli, Earlean Polka, MD  lidocaine (LIDODERM) 5 %  Place 1 patch onto the skin daily. Remove & Discard patch within 12 hours or as directed by MD Patient not taking: Reported on 05/15/2017 10/08/16   Ocie Cornfield T, PA-C  lidocaine (XYLOCAINE) 5 % ointment Apply 2-3 application topically 4 (four) times daily. Pt applies to chest and legs.    [provider]  methocarbamol (ROBAXIN) 500 MG tablet Take 1 tablet (500 mg total) by mouth 3 (three) times daily. Patient not taking: Reported on 05/15/2017 04/27/17   Shameer Molstad, PA-C  metoprolol tartrate (LOPRESSOR) 25 MG tablet Take 25 mg by mouth 2 (two) times daily.    [provider]  NONFORMULARY OR COMPOUNDED ITEM APPLY 1 GRAM (1 PUMP) TO THE AFFECTED AREA(S) UP TO 4 TIMES A DAY                        DRUG NAME: B5 D3 K10 Pent+ 03/08/17   [provider]  oxyCODONE-acetaminophen (PERCOCET/ROXICET) 5-325 MG tablet Take 1 tablet by mouth every 4 (four) hours as needed. Patient not taking: Reported on 05/15/2017 04/05/17   Evalee Jefferson, PA-C  predniSONE (DELTASONE) 10 MG tablet Take 6 tablets day one, 5 tablets day two, 4 tablets day three, 3 tablets day four, 2 tablets day five, then 1 tablet day six Patient not taking: Reported on 05/15/2017  04/27/17   Ladeana Laplant, PA-C  predniSONE (DELTASONE) 10 MG tablet Take 4 tablets (40 mg total) by mouth daily. 05/15/17   Fredia Sorrow, MD  ranolazine (RANEXA) 500 MG 12 hr tablet Take 500 mg by mouth 2 (two) times daily.    [provider]  traMADol (ULTRAM) 50 MG tablet Take 1 tablet (50 mg total) by mouth every 6 (six) hours as needed for severe pain. 04/14/17   Fransico Meadow, PA-C    Family History Family History  Problem Relation Age of Onset  . Diabetes Father   . Hypertension Father   . Arthritis Unknown   . Diabetes Unknown   . Asthma Unknown   . Stroke Sister     Social History Social History  Substance Use Topics  . Smoking status: Current Every Day Smoker    Packs/day: 0.25    Years: 30.00    Types: Cigarettes  . Smokeless tobacco: Never Used     Comment: 3 cigs per day  . Alcohol use No     Allergies   Aspirin   Review of Systems Review of Systems  Constitutional: Negative for chills and fever.  Eyes: Negative for visual disturbance.  Respiratory: Negative for chest tightness and shortness of breath.   Cardiovascular: Negative for chest pain.  Gastrointestinal: Negative for abdominal pain, nausea and vomiting.  Genitourinary: Negative for difficulty urinating and dysuria.  Musculoskeletal: Positive for back pain. Negative for joint swelling and neck pain.  Skin: Negative for color change and wound.  Neurological: Positive for numbness (numbness and tingling of the left arm) and headaches. Negative for dizziness, syncope, facial asymmetry and speech difficulty.  Psychiatric/Behavioral: Negative for confusion.  All other systems reviewed and are negative.    Physical Exam Updated Vital Signs BP (!) 189/115 (BP Location: Left Arm)   Pulse (!) 56   Temp 98.1 F (36.7 C) (Oral)   Resp 18   Ht 5\' 7"  (1.702 m)   Wt 68 kg (150 lb)   BMI 23.49 kg/m   Physical Exam  Constitutional: He is oriented to person, place, and time. He appears  well-developed and  well-nourished. No distress.  HENT:  Head: Normocephalic and atraumatic.  Mouth/Throat: Oropharynx is clear and moist.  Eyes: Pupils are equal, round, and reactive to light. Conjunctivae and EOM are normal.  Neck: Normal range of motion. Neck supple.  Cardiovascular: Normal rate, regular rhythm, normal heart sounds and intact distal pulses.   No murmur heard. Pulmonary/Chest: Effort normal and breath sounds normal. No respiratory distress. He exhibits no tenderness.  Abdominal: Soft. He exhibits no distension. There is no tenderness.  Musculoskeletal: He exhibits tenderness. He exhibits no edema.       Lumbar back: He exhibits tenderness and pain. He exhibits normal range of motion, no swelling, no deformity, no laceration and normal pulse.  ttp of the lower cervical spine and left cervical and  lumbar paraspinal muscles and left trapezius muscles. Spasm present.   Grip strength slightly diminished on the left.  Pt has 5/5 strength against resistance of bilateral lower extremities.     Neurological: He is alert and oriented to person, place, and time. No sensory deficit. He exhibits normal muscle tone. Coordination and gait normal. GCS eye subscore is 4. GCS verbal subscore is 5. GCS motor subscore is 6.  CN III-XII intact.  Speech clear, no pronator drift.  No facial droop.    Skin: Skin is warm and dry. No rash noted.  Nursing note and vitals reviewed.    ED Treatments / Results  Labs (all labs ordered are listed, but only abnormal results are displayed) Labs Reviewed - No data to display  EKG  EKG Interpretation None       Radiology No results found.  Procedures Procedures (including critical care time)  Medications Ordered in ED Medications  ketorolac (TORADOL) injection 60 mg (60 mg Intramuscular Given 05/30/17 0942)  oxyCODONE-acetaminophen (PERCOCET/ROXICET) 5-325 MG per tablet 1 tablet (1 tablet Oral Given 05/30/17 0942)     Initial Impression  / Assessment and Plan / ED Course  I have reviewed the triage vital signs and the nursing notes.  Pertinent labs & imaging results that were available during my care of the patient were reviewed by me and considered in my medical decision making (see chart for details).     Previous MRI C spine reviewed by me.  Pt has been evaluated by me in the past and appears to be at his neurological baseline. Headache is frontal and w/o meningeal signs, visual changes or fever. Left neck and arm pain are chronic and I do not feel this is related to CVA or ACS.  He has been on multiple courses of steroids recently, so I do not feel that additional steroiids would be beneficial at this time.  Pt appears stable for d/c.  He has an upcoming appt with Dr. Annette Stable next Monday.  Pt encouraged to keep appt.    Final Clinical Impressions(s) / ED Diagnoses   Final diagnoses:  Cervical radiculopathy    New Prescriptions New Prescriptions   No medications on file     Kem Parkinson, PA-C 05/30/17 Homer, [place], DO 05/31/17 1341

## 2017-06-12 ENCOUNTER — Emergency Department (HOSPITAL_COMMUNITY)
Admission: EM | Admit: 2017-06-12 | Discharge: 2017-06-12 | Disposition: A | Discharge status: Home / Self Care | Payer: Medicaid Other | Attending: Emergency Medicine | Admitting: Emergency Medicine

## 2017-06-12 ENCOUNTER — Encounter (HOSPITAL_COMMUNITY): Payer: Self-pay | Admitting: Emergency Medicine

## 2017-06-12 DIAGNOSIS — I2510 Atherosclerotic heart disease of native coronary artery without angina pectoris: Secondary | ICD-10-CM | POA: Insufficient documentation

## 2017-06-12 DIAGNOSIS — G8929 Other chronic pain: Secondary | ICD-10-CM | POA: Diagnosis not present

## 2017-06-12 DIAGNOSIS — E1140 Type 2 diabetes mellitus with diabetic neuropathy, unspecified: Secondary | ICD-10-CM | POA: Insufficient documentation

## 2017-06-12 DIAGNOSIS — Z79899 Other long term (current) drug therapy: Secondary | ICD-10-CM | POA: Insufficient documentation

## 2017-06-12 DIAGNOSIS — Z7902 Long term (current) use of antithrombotics/antiplatelets: Secondary | ICD-10-CM | POA: Diagnosis not present

## 2017-06-12 DIAGNOSIS — M549 Dorsalgia, unspecified: Secondary | ICD-10-CM | POA: Diagnosis not present

## 2017-06-12 DIAGNOSIS — Z794 Long term (current) use of insulin: Secondary | ICD-10-CM | POA: Insufficient documentation

## 2017-06-12 DIAGNOSIS — F17210 Nicotine dependence, cigarettes, uncomplicated: Secondary | ICD-10-CM | POA: Diagnosis not present

## 2017-06-12 DIAGNOSIS — F209 Schizophrenia, unspecified: Secondary | ICD-10-CM | POA: Diagnosis not present

## 2017-06-12 DIAGNOSIS — M542 Cervicalgia: Secondary | ICD-10-CM | POA: Diagnosis present

## 2017-06-12 DIAGNOSIS — I10 Essential (primary) hypertension: Secondary | ICD-10-CM | POA: Diagnosis not present

## 2017-06-12 MED ORDER — ONDANSETRON HCL 4 MG PO TABS
4.0000 mg | ORAL_TABLET | Freq: Once | ORAL | Status: AC
  Administered 2017-06-12: 4 mg via ORAL
  Filled 2017-06-12: qty 1

## 2017-06-12 MED ORDER — MORPHINE SULFATE (PF) 4 MG/ML IV SOLN
4.0000 mg | Freq: Once | INTRAVENOUS | Status: AC
  Administered 2017-06-12: 4 mg via INTRAMUSCULAR
  Filled 2017-06-12: qty 1

## 2017-06-12 MED ORDER — DEXAMETHASONE SODIUM PHOSPHATE 10 MG/ML IJ SOLN
10.0000 mg | Freq: Once | INTRAMUSCULAR | Status: AC
  Administered 2017-06-12: 10 mg via INTRAMUSCULAR
  Filled 2017-06-12: qty 1

## 2017-06-12 MED ORDER — CYCLOBENZAPRINE HCL 10 MG PO TABS
10.0000 mg | ORAL_TABLET | Freq: Once | ORAL | Status: AC
  Administered 2017-06-12: 10 mg via ORAL
  Filled 2017-06-12: qty 1

## 2017-06-12 NOTE — ED Triage Notes (Signed)
Pt c/o chronic neck/back pain that radiates into arms and legs, worse on left side. Pt reports history of neck surgery August 2017.

## 2017-06-12 NOTE — ED Provider Notes (Signed)
[facility] EMERGENCY DEPARTMENT Provider Note   CSN: [id number] Arrival date & time: 06/12/17  1205     History   Chief Complaint Chief Complaint  Patient presents with  . Back Pain    HPI Melvin Taylor is a 51 y.o. male.  Patient is a 51 year old male who presents to the emergency department with a complaint of neck pain.  The patient has chronic back and neck pain.  He has had neck surgery with "a cage put in his neck".  The patient states that he has been having more and more pain in his neck.  He was seen recently by the neurosurgeon.  They wanted to give him a steroid injection in his neck, but he states he has had that before and it does not work.  At this point the neurosurgeon asked him to do physical therapy and said that he would see him in a couple of months.  The patient states that he is just having increasing pain.  It is interfering with his attempt to work, and his activities of daily living.  He seeks some assistance with his pain.  It is also of note that the patient is seeking a another neurosurgeon.   The history is provided by the patient.    Past Medical History:  Diagnosis Date  . Anxiety   . Bipolar 1 disorder ([place])   . Cervical radiculopathy   . Chest pain   . Chronic back pain   . Chronic chest wall pain   . Chronic neck pain   . Coronary artery disease   . Depression   . Diabetes mellitus    Type II  . Hallucinations    "long history of them"  . Headache(784.0)   . History of gout   . HTN (hypertension)   . Insomnia   . Neuropathy   . Pain management   . Right leg pain    chronic  . Schizophrenia ([place])   . Seizures ([place])    last sz between July 5-9th, 2016; epilepsy  . Shortness of breath dyspnea    with exertion  . Sleep apnea   . Transfusion of blood product refused for religious reason     Patient Active Problem List   Diagnosis Date Noted  . CAD (coronary artery disease) 03/28/2016  . Chest pain 03/21/2016  .  Hyperlipidemia 03/11/2016  . Ischemic chest pain   . NSTEMI (non-ST elevated myocardial infarction) ([place])   . Coronary atherosclerosis of native coronary artery 03/03/2016  . Cervical spinal stenosis 12/14/2015  . Loss of weight 08/25/2015  . Essential hypertension, benign 07/23/2015  . Personal history of noncompliance with medical treatment, presenting hazards to health 07/23/2015  . Abdominal pain 06/08/2015  . Constipation 06/08/2015  . History of colonic polyps   . Diverticulosis of colon without hemorrhage   . Mucosal abnormality of stomach   . Encounter for screening colonoscopy 04/16/2015  . Dysphagia 04/16/2015  . Partial symptomatic epilepsy with complex partial seizures, intractable, without status epilepticus ([place]) 11/10/2014  . DM type 2 causing vascular disease ([place]) 06/29/2014  . Musculoskeletal pain 06/29/2014  . Hyperglycemia without ketosis 09/07/2013  . 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 Knee pain 12/28/2010  . Medial meniscus, posterior horn derangement 12/28/2010    Past Surgical History:  Procedure Laterality Date  . KNEE SURGERY Left    arthroscopy  . MANDIBLE FRACTURE SURGERY    . TONSILLECTOMY  Home Medications    Prior to Admission medications   Medication Sig Start Date End Date Taking? Authorizing Provider  ACCU-CHEK AVIVA PLUS test strip USE AS DIRECTED TO TEST 4 TIMES DAILY. 03/29/17   Cassandria Anger, MD  amLODipine (NORVASC) 10 MG tablet Take 1 tablet (10 mg total) by mouth daily. 04/18/16   Lendon Colonel, NP  clopidogrel (PLAVIX) 75 MG tablet Take 1 tablet (75 mg total) by mouth daily. 04/18/16   Lendon Colonel, NP  cyclobenzaprine (FLEXERIL) 10 MG tablet Take 1 tablet (10 mg total) by mouth 2 (two) times daily as needed for muscle spasms. 05/15/17   Fredia Sorrow, MD  diazepam (VALIUM) 5 MG tablet Take 5 mg by mouth every 6 (six) hours as needed for anxiety.    [provider]  divalproex  (DEPAKOTE) 500 MG DR tablet Take 3 tablets (1,500 mg total) by mouth 2 (two) times daily. 01/04/17   Penumalli, Earlean Polka, MD  Doxepin HCl 5 % CREA Apply 2 g topically 3 (three) times daily. Pt applies to chest and legs.    [provider]  Insulin Glargine (LANTUS SOLOSTAR) 100 UNIT/ML Solostar Pen Inject 30 Units into the skin at bedtime. 03/03/17   Cassandria Anger, MD  isosorbide mononitrate (IMDUR) 60 MG 24 hr tablet Take 90 mg by mouth daily.    [provider]  levETIRAcetam (KEPPRA) 1000 MG tablet Take 1 tablet (1,000 mg total) by mouth 2 (two) times daily. 01/04/17   Penumalli, Earlean Polka, MD  lidocaine (LIDODERM) 5 % Place 1 patch onto the skin daily. Remove & Discard patch within 12 hours or as directed by MD Patient not taking: Reported on 05/15/2017 10/08/16   Ocie Cornfield T, PA-C  lidocaine (XYLOCAINE) 5 % ointment Apply 2-3 application topically 4 (four) times daily. Pt applies to chest and legs.    [provider]  methocarbamol (ROBAXIN) 500 MG tablet Take 1 tablet (500 mg total) by mouth 3 (three) times daily. Patient not taking: Reported on 05/15/2017 04/27/17   Triplett, Tammy, PA-C  metoprolol tartrate (LOPRESSOR) 25 MG tablet Take 25 mg by mouth 2 (two) times daily.    [provider]  NONFORMULARY OR COMPOUNDED ITEM APPLY 1 GRAM (1 PUMP) TO THE AFFECTED AREA(S) UP TO 4 TIMES A DAY                        DRUG NAME: B5 D3 K10 Pent+ 03/08/17   [provider]  oxyCODONE-acetaminophen (PERCOCET/ROXICET) 5-325 MG tablet Take 1 tablet by mouth every 4 (four) hours as needed. 05/30/17   Triplett, Tammy, PA-C  predniSONE (DELTASONE) 10 MG tablet Take 6 tablets day one, 5 tablets day two, 4 tablets day three, 3 tablets day four, 2 tablets day five, then 1 tablet day six Patient not taking: Reported on 05/15/2017 04/27/17   Triplett, Tammy, PA-C  predniSONE (DELTASONE) 10 MG tablet Take 4 tablets (40 mg total) by mouth daily. 05/15/17   Zackowski,  Nicki Reaper, MD  RANEXA 500 MG 12 hr tablet TAKE (1) TABLET BY MOUTH TWICE DAILY. 05/31/17   Herminio Commons, MD  traMADol (ULTRAM) 50 MG tablet Take 1 tablet (50 mg total) by mouth every 6 (six) hours as needed for severe pain. 04/14/17   Fransico Meadow, PA-C    Family History Family History  Problem Relation Age of Onset  . Diabetes Father   . Hypertension Father   . Arthritis Unknown   .  Diabetes Unknown   . Asthma Unknown   . Stroke Sister     Social History Social History   Tobacco Use  . Smoking status: Current Every Day Smoker    Packs/day: 0.25    Years: 30.00    Pack years: 7.50    Types: Cigarettes  . Smokeless tobacco: Never Used  . Tobacco comment: 3 cigs per day  Substance Use Topics  . Alcohol use: No  . Drug use: No     Allergies   Aspirin   Review of Systems Review of Systems  Constitutional: Negative for activity change.       All ROS Neg except as noted in HPI  HENT: Negative for nosebleeds.   Eyes: Negative for photophobia and discharge.  Respiratory: Negative for cough, shortness of breath and wheezing.   Cardiovascular: Negative for chest pain and palpitations.  Gastrointestinal: Negative for abdominal pain and blood in stool.  Genitourinary: Negative for dysuria, frequency and hematuria.  Musculoskeletal: Positive for neck pain. Negative for arthralgias.  Skin: Negative.   Neurological: Negative for dizziness, seizures and speech difficulty.  Psychiatric/Behavioral: Negative for confusion and hallucinations.     Physical Exam Updated Vital Signs BP (!) 180/119 (BP Location: Right Arm)   Pulse (!) 53   Temp 98.6 F (37 C) (Oral)   Resp 18   Ht 5\' 7"  (1.702 m)   Wt 74.8 kg (165 lb)   SpO2 100%   BMI 25.84 kg/m   Physical Exam  Constitutional: He is oriented to person, place, and time. He appears well-developed and well-nourished.  Non-toxic appearance.  HENT:  Head: Normocephalic.  Right Ear: Tympanic membrane and external ear  normal.  Left Ear: Tympanic membrane and external ear normal.  Eyes: EOM and lids are normal. Pupils are equal, round, and reactive to light.  Neck: Normal range of motion. Neck supple. Carotid bruit is not present.  Cardiovascular: Normal rate, regular rhythm, normal heart sounds, intact distal pulses and normal pulses.  Pulmonary/Chest: Breath sounds normal. No respiratory distress.  Abdominal: Soft. Bowel sounds are normal. There is no tenderness. There is no guarding.  Musculoskeletal: Normal range of motion.  Pain on the left with attempted shoulder shrug.  Tender in the paraspinal area near the lower cervical spine.  No hot areas appreciated.  No palpable step-off of the cervical or thoracic spine area.  Some spasm noted of the upper trapezius on the left.  Lymphadenopathy:       Head (right side): No submandibular adenopathy present.       Head (left side): No submandibular adenopathy present.    He has no cervical adenopathy.  Neurological: He is alert and oriented to person, place, and time. He has normal strength. No cranial nerve deficit or sensory deficit.  Grip is slightly weaker on the left compared to the right.  There is some weakness of shoulder shrug, however this is believed to be related to pain.  No sensory deficits appreciated of the upper extremities.  There is no atrophy of the upper extremities appreciated.  Skin: Skin is warm and dry.  Psychiatric: He has a normal mood and affect. His speech is normal.  Nursing note and vitals reviewed.    ED Treatments / Results  Labs (all labs ordered are listed, but only abnormal results are displayed) Labs Reviewed - No data to display  EKG  EKG Interpretation None       Radiology No results found.  Procedures Procedures (including critical care time)  Medications Ordered in ED Medications - No data to display   Initial Impression / Assessment and Plan / ED Course  I have reviewed the triage vital signs and  the nursing notes.  Pertinent labs & imaging results that were available during my care of the patient were reviewed by me and considered in my medical decision making (see chart for details).       Final Clinical Impressions(s) / ED Diagnoses MDM Blood pressure is elevated at 215/103.  Have asked the patient to have his blood pressure rechecked by PCP as soon as possible. I have reviewed the previous emergency department visits.  There are no new neurologic deficits appreciated.  The patient has already had imaging of his cervical spine.  He is being followed by orthopedic specialist.  Patient was treated for his discomfort in the emergency department.  I discussed with the patient that if he felt further pain management was needed to discuss this with Dr. Legrand Rams and his specialist.  Patient acknowledges understanding of the discharge instructions.   Final diagnoses:  Chronic neck pain    ED Discharge Orders    None       Lily Kocher, PA-C 06/15/17 1104    Orlie Dakin, MD 06/16/17 [phone]

## 2017-06-12 NOTE — Discharge Instructions (Signed)
Your neurologic examination has not changed today.  You received medication to assist with your pain.  Please do not drive, drink, operate machinery, or participate in activities requiring concentration over the next 4 or 5 hours.  Please discuss pain management with Dr. Legrand Rams as well as your referral to a new neurosurgeon.

## 2017-07-05 ENCOUNTER — Encounter (HOSPITAL_COMMUNITY): Payer: Self-pay | Admitting: Emergency Medicine

## 2017-07-05 ENCOUNTER — Emergency Department (HOSPITAL_COMMUNITY): Payer: Medicaid Other

## 2017-07-05 ENCOUNTER — Other Ambulatory Visit: Payer: Self-pay

## 2017-07-05 ENCOUNTER — Emergency Department (HOSPITAL_COMMUNITY)
Admission: EM | Admit: 2017-07-05 | Discharge: 2017-07-05 | Disposition: A | Discharge status: Home / Self Care | Payer: Medicaid Other | Attending: Emergency Medicine | Admitting: Emergency Medicine

## 2017-07-05 ENCOUNTER — Telehealth: Payer: Self-pay | Admitting: *Deleted

## 2017-07-05 DIAGNOSIS — R51 Headache: Secondary | ICD-10-CM | POA: Diagnosis not present

## 2017-07-05 DIAGNOSIS — I259 Chronic ischemic heart disease, unspecified: Secondary | ICD-10-CM | POA: Diagnosis not present

## 2017-07-05 DIAGNOSIS — Z79899 Other long term (current) drug therapy: Secondary | ICD-10-CM | POA: Insufficient documentation

## 2017-07-05 DIAGNOSIS — Z9861 Coronary angioplasty status: Secondary | ICD-10-CM | POA: Diagnosis not present

## 2017-07-05 DIAGNOSIS — I252 Old myocardial infarction: Secondary | ICD-10-CM | POA: Diagnosis not present

## 2017-07-05 DIAGNOSIS — R519 Headache, unspecified: Secondary | ICD-10-CM

## 2017-07-05 DIAGNOSIS — E119 Type 2 diabetes mellitus without complications: Secondary | ICD-10-CM | POA: Insufficient documentation

## 2017-07-05 DIAGNOSIS — F17210 Nicotine dependence, cigarettes, uncomplicated: Secondary | ICD-10-CM | POA: Diagnosis not present

## 2017-07-05 DIAGNOSIS — I10 Essential (primary) hypertension: Secondary | ICD-10-CM | POA: Diagnosis not present

## 2017-07-05 DIAGNOSIS — Z794 Long term (current) use of insulin: Secondary | ICD-10-CM | POA: Insufficient documentation

## 2017-07-05 DIAGNOSIS — Z7902 Long term (current) use of antithrombotics/antiplatelets: Secondary | ICD-10-CM | POA: Insufficient documentation

## 2017-07-05 LAB — BASIC METABOLIC PANEL
Anion gap: 7 (ref 5–15)
BUN: 18 mg/dL (ref 6–20)
CHLORIDE: 102 mmol/L (ref 101–111)
CO2: 27 mmol/L (ref 22–32)
Calcium: 8.7 mg/dL — ABNORMAL LOW (ref 8.9–10.3)
Creatinine, Ser: 1.11 mg/dL (ref 0.61–1.24)
Glucose, Bld: 157 mg/dL — ABNORMAL HIGH (ref 65–99)
POTASSIUM: 3.8 mmol/L (ref 3.5–5.1)
SODIUM: 136 mmol/L (ref 135–145)

## 2017-07-05 LAB — CBC WITH DIFFERENTIAL/PLATELET
BASOS ABS: 0 10*3/uL (ref 0.0–0.1)
Basophils Relative: 0 %
EOS ABS: 0.1 10*3/uL (ref 0.0–0.7)
EOS PCT: 2 %
HCT: 39.3 % (ref 39.0–52.0)
HEMOGLOBIN: 13.5 g/dL (ref 13.0–17.0)
LYMPHS ABS: 3.1 10*3/uL (ref 0.7–4.0)
LYMPHS PCT: 53 %
MCH: 31 pg (ref 26.0–34.0)
MCHC: 34.4 g/dL (ref 30.0–36.0)
MCV: 90.3 fL (ref 78.0–100.0)
Monocytes Absolute: 0.6 10*3/uL (ref 0.1–1.0)
Monocytes Relative: 10 %
NEUTROS PCT: 35 %
Neutro Abs: 2 10*3/uL (ref 1.7–7.7)
PLATELETS: 155 10*3/uL (ref 150–400)
RBC: 4.35 MIL/uL (ref 4.22–5.81)
RDW: 12.4 % (ref 11.5–15.5)
WBC: 5.8 10*3/uL (ref 4.0–10.5)

## 2017-07-05 LAB — SEDIMENTATION RATE: SED RATE: 5 mm/h (ref 0–16)

## 2017-07-05 MED ORDER — MORPHINE SULFATE (PF) 4 MG/ML IV SOLN
4.0000 mg | Freq: Once | INTRAVENOUS | Status: AC
  Administered 2017-07-05: 4 mg via INTRAVENOUS
  Filled 2017-07-05: qty 1

## 2017-07-05 MED ORDER — DIPHENHYDRAMINE HCL 50 MG/ML IJ SOLN
12.5000 mg | Freq: Once | INTRAMUSCULAR | Status: AC
Start: 2017-07-05 — End: 2017-07-05
  Administered 2017-07-05: 12.5 mg via INTRAVENOUS
  Filled 2017-07-05: qty 1

## 2017-07-05 MED ORDER — METOCLOPRAMIDE HCL 5 MG/ML IJ SOLN
10.0000 mg | Freq: Once | INTRAMUSCULAR | Status: AC
  Administered 2017-07-05: 10 mg via INTRAVENOUS
  Filled 2017-07-05: qty 2

## 2017-07-05 MED ORDER — SODIUM CHLORIDE 0.9 % IV BOLUS (SEPSIS)
1000.0000 mL | Freq: Once | INTRAVENOUS | Status: AC
  Administered 2017-07-05: 1000 mL via INTRAVENOUS

## 2017-07-05 MED ORDER — CYCLOBENZAPRINE HCL 10 MG PO TABS: 10.0000 mg | ORAL_TABLET | Freq: Three times a day (TID) | ORAL | 0 refills | Status: DC | PRN

## 2017-07-05 NOTE — Telephone Encounter (Signed)
Spoke with patient and advised him that Dr Leta Baptist manages his seizure disorder. Advised he see Dr Legrand Rams, PCP first to be evaluated for headaches, migraines and to address his hypertension. Advised if PCP deems appropriate after evaluating patient, then he may refer to Dr Leta Baptist for this new problem.  Patient verbalized understanding, appreciation.

## 2017-07-05 NOTE — Telephone Encounter (Signed)
Patient had an appointment for this Friday that he had to cancel.  States he forgot about the appointment and has no transportation.  The next available is in May.  States he cant wait that long because he is having lots of migraines.  Wants to know if there is anyway he could be seen sooner than the next available?  Please call.

## 2017-07-05 NOTE — Telephone Encounter (Signed)
I am managing seizure disorder. Pt should follow up with PCP first. -VRP

## 2017-07-05 NOTE — ED Triage Notes (Signed)
Pt c/o waking up yesterday with head pain, left eye blurred vision, and felling off balance and nausea. Sx progressively worse since yesterday. Pt pale.

## 2017-07-05 NOTE — Discharge Instructions (Signed)
Call Dr. Josephine Cables office to arrange a follow-up appointment.  It's important to take your blood pressure medications daily as directed.  Return here for any worsening symptoms

## 2017-07-05 NOTE — ED Provider Notes (Signed)
[facility] EMERGENCY DEPARTMENT Provider Note   CSN: [id number] Arrival date & time: 07/05/17  4174     History   Chief Complaint Chief Complaint  Patient presents with  . Headache    HPI Melvin Taylor is a 51 y.o. male.  HPI   Melvin Taylor is a 51 y.o. male who presents to the Emergency Department complaining of recurrent left neck pain and headache.  States he developed a throbbing pain to the left side of his face and head that later centralized behind his left eye.  Pain gradually worsened throughout the day and radiates from the left face and head and radiates down his left neck, shoulder and into his left arm.  He states the pain feels like previous migraine headaches.  Pain is associated with sensitivity to light and nausea.  States he took tramadol and Tylenol yesterday without relief.  Pain is exacerbated with left arm movement.  He denies vomiting, facial weakness, numbness of face or extremities, visual changes, chest pain, shortness of breath and dysarthria.    Past Medical History:  Diagnosis Date  . Anxiety   . Bipolar 1 disorder ([place])   . Cervical radiculopathy   . Chest pain   . Chronic back pain   . Chronic chest wall pain   . Chronic neck pain   . Coronary artery disease   . Depression   . Diabetes mellitus    Type II  . Hallucinations    "long history of them"  . Headache(784.0)   . History of gout   . HTN (hypertension)   . Insomnia   . Neuropathy   . Pain management   . Right leg pain    chronic  . Schizophrenia ([place])   . Seizures ([place])    last sz between July 5-9th, 2016; epilepsy  . Shortness of breath dyspnea    with exertion  . Sleep apnea   . Transfusion of blood product refused for religious reason     Patient Active Problem List   Diagnosis Date Noted  . CAD (coronary artery disease) 03/28/2016  . Chest pain 03/21/2016  . Hyperlipidemia 03/11/2016  . Ischemic chest pain   . NSTEMI (non-ST elevated myocardial  infarction) ([place])   . Coronary atherosclerosis of native coronary artery 03/03/2016  . Cervical spinal stenosis 12/14/2015  . Loss of weight 08/25/2015  . Essential hypertension, benign 07/23/2015  . Personal history of noncompliance with medical treatment, presenting hazards to health 07/23/2015  . Abdominal pain 06/08/2015  . Constipation 06/08/2015  . History of colonic polyps   . Diverticulosis of colon without hemorrhage   . Mucosal abnormality of stomach   . Encounter for screening colonoscopy 04/16/2015  . Dysphagia 04/16/2015  . Partial symptomatic epilepsy with complex partial seizures, intractable, without status epilepticus ([place]) 11/10/2014  . DM type 2 causing vascular disease ([place]) 06/29/2014  . Musculoskeletal pain 06/29/2014  . Hyperglycemia without ketosis 09/07/2013  . 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 Knee pain 12/28/2010  . Medial meniscus, posterior horn derangement 12/28/2010    Past Surgical History:  Procedure Laterality Date  . ANTERIOR CERVICAL DECOMP/DISCECTOMY FUSION N/A 12/14/2015   Procedure: ANTERIOR CERVICAL DECOMPRESSION/DISCECTOMY FUSION CERVICAL FIVE -SIX;  Surgeon: Earnie Larsson, MD;  Location: [facility];  Service: Neurosurgery;  Laterality: N/A;  . BIOPSY  05/07/2015   Procedure: BIOPSY (Gastric);  Surgeon: [name], MD;  Location: [facility];  Service: Endoscopy;;  . CARDIAC CATHETERIZATION N/A 03/07/2016  Procedure: Left Heart Cath and Coronary Angiography;  Surgeon: Belva Crome, MD;  Location: [facility];  Service: Cardiovascular;  Laterality: N/A;  . COLONOSCOPY WITH PROPOFOL N/A 05/07/2015   XIP:[id number] diverticulosis, multiple colon polyps removed, tubular adenoma, serrated colon polyp. Next colonoscopy October 2019  . CORONARY ARTERY BYPASS GRAFT N/A 03/28/2016   Procedure: CORONARY ARTERY BYPASS GRAFTING (CABG) x 5 USING GREATER SAPHENOUS VEIN;  Surgeon: [name], MD;  Location: [facility];  Service: Open  Heart Surgery;  Laterality: N/A;  . ENDOVEIN HARVEST OF GREATER SAPHENOUS VEIN Right 03/28/2016   Procedure: ENDOVEIN HARVEST OF GREATER SAPHENOUS VEIN;  Surgeon: [name], MD;  Location: [facility];  Service: Open Heart Surgery;  Laterality: Right;  . ESOPHAGEAL DILATION N/A 05/07/2015   Procedure: ESOPHAGEAL DILATION WITH 56FR MALONEY DILATOR;  Surgeon: [name], MD;  Location: [facility];  Service: Endoscopy;  Laterality: N/A;  . ESOPHAGOGASTRODUODENOSCOPY (EGD) WITH PROPOFOL N/A 05/07/2015   RMR: Status post dilation of normal esophagus. Gastritis.  Marland Kitchen KNEE SURGERY Left    arthroscopy  . MANDIBLE FRACTURE SURGERY    . POLYPECTOMY  05/07/2015   Procedure: POLYPECTOMY (Hepatic Flexure, Distal Transverse Colon, Rectal);  Surgeon: [name], MD;  Location: [facility];  Service: Endoscopy;;  . TEE WITHOUT CARDIOVERSION N/A 03/28/2016   Procedure: TRANSESOPHAGEAL ECHOCARDIOGRAM (TEE);  Surgeon: [name], MD;  Location: [facility];  Service: Open Heart Surgery;  Laterality: N/A;  . TONSILLECTOMY         Home Medications    Prior to Admission medications   Medication Sig Start Date End Date Taking? Authorizing Provider  ACCU-CHEK AVIVA PLUS test strip USE AS DIRECTED TO TEST 4 TIMES DAILY. 03/29/17   Cassandria Anger, MD  amLODipine (NORVASC) 10 MG tablet Take 1 tablet (10 mg total) by mouth daily. 04/18/16   Lendon Colonel, NP  clopidogrel (PLAVIX) 75 MG tablet Take 1 tablet (75 mg total) by mouth daily. 04/18/16   Lendon Colonel, NP  cyclobenzaprine (FLEXERIL) 10 MG tablet Take 1 tablet (10 mg total) by mouth 2 (two) times daily as needed for muscle spasms. 05/15/17   Fredia Sorrow, MD  diazepam (VALIUM) 5 MG tablet Take 5 mg by mouth every 6 (six) hours as needed for anxiety.    [provider]  divalproex (DEPAKOTE) 500 MG DR tablet Take 3 tablets (1,500 mg total) by mouth 2 (two) times daily. 01/04/17   Penumalli, Earlean Polka, MD  Doxepin HCl 5 % CREA Apply 2 g  topically 3 (three) times daily. Pt applies to chest and legs.    [provider]  Insulin Glargine (LANTUS SOLOSTAR) 100 UNIT/ML Solostar Pen Inject 30 Units into the skin at bedtime. 03/03/17   Cassandria Anger, MD  isosorbide mononitrate (IMDUR) 60 MG 24 hr tablet Take 90 mg by mouth daily.    [provider]  levETIRAcetam (KEPPRA) 1000 MG tablet Take 1 tablet (1,000 mg total) by mouth 2 (two) times daily. 01/04/17   Penumalli, Earlean Polka, MD  lidocaine (LIDODERM) 5 % Place 1 patch onto the skin daily. Remove & Discard patch within 12 hours or as directed by MD Patient not taking: Reported on 05/15/2017 10/08/16   Ocie Cornfield T, PA-C  lidocaine (XYLOCAINE) 5 % ointment Apply 2-3 application topically 4 (four) times daily. Pt applies to chest and legs.    [provider]  methocarbamol (ROBAXIN) 500 MG tablet Take 1 tablet (500 mg total) by  mouth 3 (three) times daily. Patient not taking: Reported on 05/15/2017 04/27/17   Rook Maue, PA-C  metoprolol tartrate (LOPRESSOR) 25 MG tablet Take 25 mg by mouth 2 (two) times daily.    [provider]  NONFORMULARY OR COMPOUNDED ITEM APPLY 1 GRAM (1 PUMP) TO THE AFFECTED AREA(S) UP TO 4 TIMES A DAY                        DRUG NAME: B5 D3 K10 Pent+ 03/08/17   [provider]  oxyCODONE-acetaminophen (PERCOCET/ROXICET) 5-325 MG tablet Take 1 tablet by mouth every 4 (four) hours as needed. 05/30/17   Zamiya Dillard, PA-C  predniSONE (DELTASONE) 10 MG tablet Take 6 tablets day one, 5 tablets day two, 4 tablets day three, 3 tablets day four, 2 tablets day five, then 1 tablet day six Patient not taking: Reported on 05/15/2017 04/27/17   Rickardo Brinegar, PA-C  predniSONE (DELTASONE) 10 MG tablet Take 4 tablets (40 mg total) by mouth daily. 05/15/17   Zackowski, Nicki Reaper, MD  RANEXA 500 MG 12 hr tablet TAKE (1) TABLET BY MOUTH TWICE DAILY. 05/31/17   Herminio Commons, MD  traMADol (ULTRAM) 50 MG tablet Take 1  tablet (50 mg total) by mouth every 6 (six) hours as needed for severe pain. 04/14/17   Fransico Meadow, PA-C    Family History Family History  Problem Relation Age of Onset  . Diabetes Father   . Hypertension Father   . Arthritis Unknown   . Diabetes Unknown   . Asthma Unknown   . Stroke Sister     Social History Social History   Tobacco Use  . Smoking status: Current Every Day Smoker    Packs/day: 0.25    Years: 30.00    Pack years: 7.50    Types: Cigarettes  . Smokeless tobacco: Never Used  . Tobacco comment: 3 cigs per day  Substance Use Topics  . Alcohol use: No  . Drug use: No     Allergies   Aspirin   Review of Systems Review of Systems  Constitutional: Negative for activity change, appetite change, chills and fever.  HENT: Negative for facial swelling and trouble swallowing.   Eyes: Positive for photophobia. Negative for pain and visual disturbance.  Respiratory: Negative for chest tightness and shortness of breath.   Cardiovascular: Negative for chest pain.  Gastrointestinal: Positive for nausea. Negative for abdominal pain and vomiting.  Genitourinary: Negative for difficulty urinating and dysuria.  Musculoskeletal: Positive for neck pain. Negative for arthralgias, joint swelling and neck stiffness.  Skin: Negative for color change, rash and wound.  Neurological: Positive for headaches. Negative for dizziness, syncope, facial asymmetry, speech difficulty, weakness and numbness.  Psychiatric/Behavioral: Negative for confusion and decreased concentration.  All other systems reviewed and are negative.    Physical Exam Updated Vital Signs There were no vitals taken for this visit.  Physical Exam  Constitutional: He is oriented to person, place, and time. He appears well-developed and well-nourished. No distress.  HENT:  Head: Normocephalic and atraumatic.  Mouth/Throat: Oropharynx is clear and moist.  Eyes: Conjunctivae and EOM are normal. Pupils are  equal, round, and reactive to light.  Neck: Phonation normal. Neck supple. No JVD present. Muscular tenderness present. No spinous process tenderness present. No neck rigidity. Decreased range of motion present. No erythema present. No Kernig's sign noted. No thyromegaly present.  Cardiovascular: Normal rate, regular rhythm and intact distal pulses.  No murmur heard. Pulmonary/Chest:  Effort normal and breath sounds normal. No respiratory distress. He exhibits no tenderness.  Abdominal: Soft. There is no tenderness.  Musculoskeletal: He exhibits tenderness. He exhibits no edema.       Cervical back: He exhibits tenderness. He exhibits normal range of motion, no bony tenderness, no swelling, no deformity, no spasm and normal pulse.  ttp of the left cervical paraspinal muscles and along the left trapezius muscle.  Mild midline tenderness to palp, no bony step offs  Lymphadenopathy:    He has no cervical adenopathy.  Neurological: He is alert and oriented to person, place, and time. He has normal strength. No cranial nerve deficit or sensory deficit. He exhibits normal muscle tone. Coordination and gait normal. GCS eye subscore is 4. GCS verbal subscore is 5. GCS motor subscore is 6.  Reflex Scores:      Tricep reflexes are 2+ on the right side and 2+ on the left side.      Bicep reflexes are 2+ on the right side and 2+ on the left side. CN II-XII intact.  Speech clear, no pronator drift.  Pain reproduced on left with shoulder shrug.  Grip strength slightly weaker on left than right   Skin: Skin is warm and dry. Capillary refill takes less than 2 seconds.  Psychiatric: He has a normal mood and affect. Thought content normal.  Nursing note and vitals reviewed.    ED Treatments / Results  Labs (all labs ordered are listed, but only abnormal results are displayed) Labs Reviewed  BASIC METABOLIC PANEL - Abnormal; Notable for the following components:      Result Value   Glucose, Bld 157 (*)     Calcium 8.7 (*)    All other components within normal limits  CBC WITH DIFFERENTIAL/PLATELET  SEDIMENTATION RATE    EKG  EKG Interpretation  Date/Time:  Wednesday July 05 2017 08:35:24 EST Ventricular Rate:  48 PR Interval:    QRS Duration: 94 QT Interval:  477 QTC Calculation: 427 R Axis:   -62 Text Interpretation:  Sinus bradycardia Probable left atrial enlargement Left anterior fascicular block Consider anterior infarct Confirmed by Isla Pence [phone]) on 07/05/2017 8:51:22 AM       Radiology Ct Head Wo Contrast  Result Date: 07/05/2017 CLINICAL DATA:  Headache, left-sided blurry vision. EXAM: CT HEAD WITHOUT CONTRAST TECHNIQUE: Contiguous axial images were obtained from the base of the skull through the vertex without intravenous contrast. COMPARISON:  CT scan of October 16, 2015. FINDINGS: Brain: No evidence of acute infarction, hemorrhage, hydrocephalus, extra-axial collection or mass lesion/mass effect. Vascular: No hyperdense vessel or unexpected calcification. Skull: Normal. Negative for fracture or focal lesion. Sinuses/Orbits: No acute finding. Other: None. IMPRESSION: Normal head CT. Electronically Signed   By: Marijo Conception, M.D.   On: 07/05/2017 09:55    Procedures Procedures (including critical care time)  Medications Ordered in ED Medications  morphine 4 MG/ML injection 4 mg (4 mg Intravenous Given 07/05/17 0914)  metoCLOPramide (REGLAN) injection 10 mg (10 mg Intravenous Given 07/05/17 0914)  diphenhydrAMINE (BENADRYL) injection 12.5 mg (12.5 mg Intravenous Given 07/05/17 0914)  sodium chloride 0.9 % bolus 1,000 mL (0 mLs Intravenous Stopped 07/05/17 1101)     Initial Impression / Assessment and Plan / ED Course  I have reviewed the triage vital signs and the nursing notes.  Pertinent labs & imaging results that were available during my care of the patient were reviewed by me and considered in my medical decision making (see chart  for details).     Pt  reviewed on the [place] narcotic database.  Received #90 oxycodone 5 mg, Tramadol #30 and Diazepam 5 mg last month.  Results considered in my MDM.    Pt is well appearing, no meningeal signs.  Mild left UE weakness on exam which is chronic otherwise nml neuro exam.  Labs and CT head are reassuring.    1100  On recheck, pt reports feeling better and states he is ready to be discharged.  Admits to taking only one of his BP meds this morning.  After pain medication and IVF's, remains hypertensive, but BP improved.  Agrees to take his BP medication when he returns home. He appears stable for discharge, advised patient he will need to arrange follow-up with his PCP.  Final Clinical Impressions(s) / ED Diagnoses   Final diagnoses:  Bad headache  Essential hypertension    ED Discharge Orders    None       Kem Parkinson, PA-C 07/06/17 1227    Isla Pence, MD 07/09/17 [phone]

## 2017-07-07 ENCOUNTER — Other Ambulatory Visit: Payer: Self-pay | Admitting: Adult Health

## 2017-07-07 ENCOUNTER — Ambulatory Visit: Payer: Medicaid Other | Admitting: Diagnostic Neuroimaging

## 2017-07-07 ENCOUNTER — Encounter (HOSPITAL_COMMUNITY): Payer: Self-pay

## 2017-07-07 ENCOUNTER — Emergency Department (HOSPITAL_COMMUNITY)
Admission: EM | Admit: 2017-07-07 | Discharge: 2017-07-07 | Disposition: A | Discharge status: Home / Self Care | Payer: Medicaid Other | Attending: Emergency Medicine | Admitting: Emergency Medicine

## 2017-07-07 ENCOUNTER — Other Ambulatory Visit: Payer: Self-pay

## 2017-07-07 DIAGNOSIS — Z7902 Long term (current) use of antithrombotics/antiplatelets: Secondary | ICD-10-CM | POA: Diagnosis not present

## 2017-07-07 DIAGNOSIS — Z79899 Other long term (current) drug therapy: Secondary | ICD-10-CM | POA: Diagnosis not present

## 2017-07-07 DIAGNOSIS — R569 Unspecified convulsions: Secondary | ICD-10-CM | POA: Diagnosis present

## 2017-07-07 DIAGNOSIS — F17210 Nicotine dependence, cigarettes, uncomplicated: Secondary | ICD-10-CM | POA: Diagnosis not present

## 2017-07-07 DIAGNOSIS — Z951 Presence of aortocoronary bypass graft: Secondary | ICD-10-CM | POA: Diagnosis not present

## 2017-07-07 DIAGNOSIS — E119 Type 2 diabetes mellitus without complications: Secondary | ICD-10-CM | POA: Insufficient documentation

## 2017-07-07 DIAGNOSIS — I2510 Atherosclerotic heart disease of native coronary artery without angina pectoris: Secondary | ICD-10-CM | POA: Diagnosis not present

## 2017-07-07 DIAGNOSIS — T426X1A Poisoning by other antiepileptic and sedative-hypnotic drugs, accidental (unintentional), initial encounter: Secondary | ICD-10-CM | POA: Diagnosis not present

## 2017-07-07 DIAGNOSIS — Z794 Long term (current) use of insulin: Secondary | ICD-10-CM | POA: Insufficient documentation

## 2017-07-07 DIAGNOSIS — G40909 Epilepsy, unspecified, not intractable, without status epilepticus: Secondary | ICD-10-CM

## 2017-07-07 DIAGNOSIS — I252 Old myocardial infarction: Secondary | ICD-10-CM | POA: Insufficient documentation

## 2017-07-07 DIAGNOSIS — I10 Essential (primary) hypertension: Secondary | ICD-10-CM | POA: Diagnosis not present

## 2017-07-07 LAB — COMPREHENSIVE METABOLIC PANEL
ALT: 15 U/L — ABNORMAL LOW (ref 17–63)
AST: 14 U/L — AB (ref 15–41)
Albumin: 3.2 g/dL — ABNORMAL LOW (ref 3.5–5.0)
Alkaline Phosphatase: 39 U/L (ref 38–126)
Anion gap: 4 — ABNORMAL LOW (ref 5–15)
BUN: 17 mg/dL (ref 6–20)
CHLORIDE: 105 mmol/L (ref 101–111)
CO2: 28 mmol/L (ref 22–32)
Calcium: 8.3 mg/dL — ABNORMAL LOW (ref 8.9–10.3)
Creatinine, Ser: 1.27 mg/dL — ABNORMAL HIGH (ref 0.61–1.24)
GFR calc Af Amer: >60 mL/min (ref >60)
Glucose, Bld: 125 mg/dL — ABNORMAL HIGH (ref 65–99)
POTASSIUM: 3.5 mmol/L (ref 3.5–5.1)
SODIUM: 137 mmol/L (ref 135–145)
Total Bilirubin: 0.8 mg/dL (ref 0.3–1.2)
Total Protein: 5.3 g/dL — ABNORMAL LOW (ref 6.5–8.1)

## 2017-07-07 LAB — CBC WITH DIFFERENTIAL/PLATELET
Basophils Absolute: 0 10*3/uL (ref 0.0–0.1)
Basophils Relative: 0 %
EOS PCT: 2 %
Eosinophils Absolute: 0.1 10*3/uL (ref 0.0–0.7)
HCT: 37.1 % — ABNORMAL LOW (ref 39.0–52.0)
HEMOGLOBIN: 12.7 g/dL — AB (ref 13.0–17.0)
LYMPHS ABS: 3.1 10*3/uL (ref 0.7–4.0)
LYMPHS PCT: 50 %
MCH: 31.5 pg (ref 26.0–34.0)
MCHC: 34.2 g/dL (ref 30.0–36.0)
MCV: 92.1 fL (ref 78.0–100.0)
Monocytes Absolute: 0.6 10*3/uL (ref 0.1–1.0)
Monocytes Relative: 9 %
NEUTROS PCT: 39 %
Neutro Abs: 2.4 10*3/uL (ref 1.7–7.7)
Platelets: 146 10*3/uL — ABNORMAL LOW (ref 150–400)
RBC: 4.03 MIL/uL — AB (ref 4.22–5.81)
RDW: 12.6 % (ref 11.5–15.5)
WBC: 6.2 10*3/uL (ref 4.0–10.5)

## 2017-07-07 LAB — VALPROIC ACID LEVEL: VALPROIC ACID LVL: 116 ug/mL — AB (ref 50.0–100.0)

## 2017-07-07 MED ORDER — SODIUM CHLORIDE 0.9 % IV BOLUS (SEPSIS)
1000.0000 mL | Freq: Once | INTRAVENOUS | Status: AC
  Administered 2017-07-07: 1000 mL via INTRAVENOUS

## 2017-07-07 MED ORDER — DIPHENHYDRAMINE HCL 50 MG/ML IJ SOLN
25.0000 mg | Freq: Once | INTRAMUSCULAR | Status: AC
  Administered 2017-07-07: 25 mg via INTRAVENOUS
  Filled 2017-07-07: qty 1

## 2017-07-07 MED ORDER — METOCLOPRAMIDE HCL 5 MG/ML IJ SOLN
10.0000 mg | Freq: Once | INTRAMUSCULAR | Status: AC
  Administered 2017-07-07: 10 mg via INTRAVENOUS
  Filled 2017-07-07: qty 2

## 2017-07-07 NOTE — Discharge Instructions (Signed)
Your valproic acid level is too high. It should be 50-100 and yours is 116. Take 2 depakote pills in the morning and take 3 in the evening. Follow up with your doctor to get the results of your keppra blood test and to follow your depakote level to make sure it stays in the therapeutic range. STOP THE TRAMADOL, it can make you have seizures more easily. Also if your seizure medication levels are too high that can also make you have seizures more easily. Also let Dr Legrand Rams know your blood pressures have been getting higher. He may want to adjust your blood pressure medications. Recheck as needed.

## 2017-07-07 NOTE — ED Provider Notes (Signed)
[facility] EMERGENCY DEPARTMENT Provider Note   CSN: [id number] Arrival date & time: 07/07/17  0121  Time seen 01:55 AM   History   Chief Complaint Chief Complaint  Patient presents with  . Seizures    HPI Melvin Taylor is a 51 y.o. male.  HPI patient states he was asleep and the next thing he knew he woke up he was on the floor.  He states he has a history of seizures and his last one was about a year ago.  He denies any urinary incontinence with this episode.  He denies any injury including tongue injury with this episode.  He states he does have a diffuse throbbing headache which is very typical after he has a seizure.  He denies any nausea or vomiting now but states he did have mild nausea earlier.  He states he has blurred vision in his left eye which is old and is not changed.  He states the past 2 days he has noted his blood pressure has been high.  He also states his doctor started him on tramadol about a month ago.  PCP Rosita Fire, MD   Past Medical History:  Diagnosis Date  . Anxiety   . Bipolar 1 disorder ([place])   . Cervical radiculopathy   . Chest pain   . Chronic back pain   . Chronic chest wall pain   . Chronic neck pain   . Coronary artery disease   . Depression   . Diabetes mellitus    Type II  . Hallucinations    "long history of them"  . Headache(784.0)   . History of gout   . HTN (hypertension)   . Insomnia   . Neuropathy   . Pain management   . Right leg pain    chronic  . Schizophrenia ([place])   . Seizures ([place])    last sz between July 5-9th, 2016; epilepsy  . Shortness of breath dyspnea    with exertion  . Sleep apnea   . Transfusion of blood product refused for religious reason     Patient Active Problem List   Diagnosis Date Noted  . CAD (coronary artery disease) 03/28/2016  . Chest pain 03/21/2016  . Hyperlipidemia 03/11/2016  . Ischemic chest pain   . NSTEMI (non-ST elevated myocardial infarction) ([place])   . Coronary  atherosclerosis of native coronary artery 03/03/2016  . Cervical spinal stenosis 12/14/2015  . Loss of weight 08/25/2015  . Essential hypertension, benign 07/23/2015  . Personal history of noncompliance with medical treatment, presenting hazards to health 07/23/2015  . Abdominal pain 06/08/2015  . Constipation 06/08/2015  . History of colonic polyps   . Diverticulosis of colon without hemorrhage   . Mucosal abnormality of stomach   . Encounter for screening colonoscopy 04/16/2015  . Dysphagia 04/16/2015  . Partial symptomatic epilepsy with complex partial seizures, intractable, without status epilepticus ([place]) 11/10/2014  . DM type 2 causing vascular disease ([place]) 06/29/2014  . Musculoskeletal pain 06/29/2014  . Hyperglycemia without ketosis 09/07/2013  . 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 Knee pain 12/28/2010  . Medial meniscus, posterior horn derangement 12/28/2010    Past Surgical History:  Procedure Laterality Date  . ANTERIOR CERVICAL DECOMP/DISCECTOMY FUSION N/A 12/14/2015   Procedure: ANTERIOR CERVICAL DECOMPRESSION/DISCECTOMY FUSION CERVICAL FIVE -SIX;  Surgeon: Earnie Larsson, MD;  Location: [place] [facility];  Service: Neurosurgery;  Laterality: N/A;  . BIOPSY  05/07/2015   Procedure: BIOPSY (Gastric);  Surgeon: [name],  MD;  Location: [facility];  Service: Endoscopy;;  . CARDIAC CATHETERIZATION N/A 03/07/2016   Procedure: Left Heart Cath and Coronary Angiography;  Surgeon: Belva Crome, MD;  Location: [place] CV LAB;  Service: Cardiovascular;  Laterality: N/A;  . COLONOSCOPY WITH PROPOFOL N/A 05/07/2015   [id number] diverticulosis, multiple colon polyps removed, tubular adenoma, serrated colon polyp. Next colonoscopy October 2019  . CORONARY ARTERY BYPASS GRAFT N/A 03/28/2016   Procedure: CORONARY ARTERY BYPASS GRAFTING (CABG) x 5 USING GREATER SAPHENOUS VEIN;  Surgeon: [name], MD;  Location: [place] OR;  Service: Open Heart Surgery;  Laterality: N/A;  .  ENDOVEIN HARVEST OF GREATER SAPHENOUS VEIN Right 03/28/2016   Procedure: ENDOVEIN HARVEST OF GREATER SAPHENOUS VEIN;  Surgeon: [name], MD;  Location: [place];  Service: Open Heart Surgery;  Laterality: Right;  . ESOPHAGEAL DILATION N/A 05/07/2015   Procedure: ESOPHAGEAL DILATION WITH 56FR MALONEY DILATOR;  Surgeon: [name], MD;  Location: [facility];  Service: Endoscopy;  Laterality: N/A;  . ESOPHAGOGASTRODUODENOSCOPY (EGD) WITH PROPOFOL N/A 05/07/2015   RMR: Status post dilation of normal esophagus. Gastritis.  Marland Kitchen KNEE SURGERY Left    arthroscopy  . MANDIBLE FRACTURE SURGERY    . POLYPECTOMY  05/07/2015   Procedure: POLYPECTOMY (Hepatic Flexure, Distal Transverse Colon, Rectal);  Surgeon: [name], MD;  Location: [facility];  Service: Endoscopy;;  . TEE WITHOUT CARDIOVERSION N/A 03/28/2016   Procedure: TRANSESOPHAGEAL ECHOCARDIOGRAM (TEE);  Surgeon: [name], MD;  Location: [place];  Service: Open Heart Surgery;  Laterality: N/A;  . TONSILLECTOMY         Home Medications    Prior to Admission medications   Medication Sig Start Date End Date Taking? Authorizing Provider  ACCU-CHEK AVIVA PLUS test strip USE AS DIRECTED TO TEST 4 TIMES DAILY. 03/29/17   Cassandria Anger, MD  amLODipine (NORVASC) 10 MG tablet Take 1 tablet (10 mg total) by mouth daily. 04/18/16   Lendon Colonel, NP  clopidogrel (PLAVIX) 75 MG tablet Take 1 tablet (75 mg total) by mouth daily. 04/18/16   Lendon Colonel, NP  cyclobenzaprine (FLEXERIL) 10 MG tablet Take 1 tablet (10 mg total) by mouth 3 (three) times daily as needed. 07/05/17   Triplett, Tammy, PA-C  diazepam (VALIUM) 5 MG tablet Take 5 mg by mouth every 6 (six) hours as needed for anxiety.    [provider]  divalproex (DEPAKOTE) 500 MG DR tablet Take 3 tablets (1,500 mg total) by mouth 2 (two) times daily. 01/04/17   Penumalli, Earlean Polka, MD  Doxepin HCl 5 % CREA Apply 2 g topically 3 (three) times daily. Pt applies to chest and  legs.    [provider]  Insulin Glargine (LANTUS SOLOSTAR) 100 UNIT/ML Solostar Pen Inject 30 Units into the skin at bedtime. 03/03/17   Cassandria Anger, MD  isosorbide mononitrate (IMDUR) 60 MG 24 hr tablet Take 90 mg by mouth daily.    [provider]  levETIRAcetam (KEPPRA) 1000 MG tablet Take 1 tablet (1,000 mg total) by mouth 2 (two) times daily. 01/04/17   Penumalli, Earlean Polka, MD  lidocaine (LIDODERM) 5 % Place 1 patch onto the skin daily. Remove & Discard patch within 12 hours or as directed by MD Patient not taking: Reported on 05/15/2017 10/08/16   Ocie Cornfield T, PA-C  lidocaine (XYLOCAINE) 5 % ointment Apply 2-3 application topically 4 (four) times daily. Pt applies to chest and legs.    [provider]  methocarbamol (ROBAXIN) 500 MG tablet Take 1 tablet (500 mg total) by mouth 3 (three) times daily. Patient not taking: Reported on 05/15/2017 04/27/17   Triplett, Tammy, PA-C  metoprolol tartrate (LOPRESSOR) 25 MG tablet Take 25 mg by mouth 2 (two) times daily.    [provider]  NONFORMULARY OR COMPOUNDED ITEM APPLY 1 GRAM (1 PUMP) TO THE AFFECTED AREA(S) UP TO 4 TIMES A DAY                        DRUG NAME: B5 D3 K10 Pent+ 03/08/17   [provider]  oxyCODONE-acetaminophen (PERCOCET/ROXICET) 5-325 MG tablet Take 1 tablet by mouth every 4 (four) hours as needed. 05/30/17   Triplett, Tammy, PA-C  predniSONE (DELTASONE) 10 MG tablet Take 6 tablets day one, 5 tablets day two, 4 tablets day three, 3 tablets day four, 2 tablets day five, then 1 tablet day six Patient not taking: Reported on 05/15/2017 04/27/17   Triplett, Tammy, PA-C  predniSONE (DELTASONE) 10 MG tablet Take 4 tablets (40 mg total) by mouth daily. Patient not taking: Reported on 07/05/2017 05/15/17   Fredia Sorrow, MD  RANEXA 500 MG 12 hr tablet TAKE (1) TABLET BY MOUTH TWICE DAILY. 05/31/17   Herminio Commons, MD  traMADol (ULTRAM) 50 MG tablet Take 1 tablet (50 mg  total) by mouth every 6 (six) hours as needed for severe pain. 04/14/17   Fransico Meadow, PA-C    Family History Family History  Problem Relation Age of Onset  . Diabetes Father   . Hypertension Father   . Arthritis Unknown   . Diabetes Unknown   . Asthma Unknown   . Stroke Sister     Social History Social History   Tobacco Use  . Smoking status: Current Every Day Smoker    Packs/day: 0.25    Years: 30.00    Pack years: 7.50    Types: Cigarettes  . Smokeless tobacco: Never Used  . Tobacco comment: 3 cigs per day  Substance Use Topics  . Alcohol use: No  . Drug use: No  on disability for seizures   Allergies   Aspirin   Review of Systems Review of Systems  All other systems reviewed and are negative.    Physical Exam Updated Vital Signs BP (!) 193/99   Pulse (!) 51   Temp 97.9 F (36.6 C) (Oral)   Resp (!) 24   SpO2 99%   Vital signs normal except for hypertension and bradycardia  Physical Exam  Constitutional: He is oriented to person, place, and time. He appears well-developed and well-nourished.  Non-toxic appearance. He does not appear ill. No distress.  HENT:  Head: Normocephalic and atraumatic.  Right Ear: External ear normal.  Left Ear: External ear normal.  Nose: Nose normal. No mucosal edema or rhinorrhea.  Mouth/Throat: Oropharynx is clear and moist and mucous membranes are normal. No dental abscesses or uvula swelling.  No trauma to the tongue  Eyes: Conjunctivae and EOM are normal. Pupils are equal, round, and reactive to light.  Neck: Normal range of motion and full passive range of motion without pain. Neck supple.  Cardiovascular: Normal rate, regular rhythm and normal heart sounds. Exam reveals no gallop and no friction rub.  No murmur heard. Pulmonary/Chest: Effort normal and breath sounds normal. No respiratory distress. He has no wheezes. He has no rhonchi. He has no rales. He exhibits no tenderness and no crepitus.  Abdominal: Soft.  Normal  appearance and bowel sounds are normal. He exhibits no distension. There is no tenderness. There is no rebound and no guarding.  Musculoskeletal: Normal range of motion. He exhibits no edema or tenderness.  Moves all extremities well.   Neurological: He is alert and oriented to person, place, and time. He has normal strength. No cranial nerve deficit.  Skin: Skin is warm, dry and intact. No rash noted. No erythema. No pallor.  Psychiatric: He has a normal mood and affect. His speech is normal and behavior is normal. His mood appears not anxious.  Nursing note and vitals reviewed.    ED Treatments / Results  Labs (all labs ordered are listed, but only abnormal results are displayed) Results for orders placed or performed during the hospital encounter of 07/07/17  Valproic acid level  Result Value Ref Range   Valproic Acid Lvl 116 (H) 50.0 - 100.0 ug/mL  Comprehensive metabolic panel  Result Value Ref Range   Sodium 137 135 - 145 mmol/L   Potassium 3.5 3.5 - 5.1 mmol/L   Chloride 105 101 - 111 mmol/L   CO2 28 22 - 32 mmol/L   Glucose, Bld 125 (H) 65 - 99 mg/dL   BUN 17 6 - 20 mg/dL   Creatinine, Ser 1.27 (H) 0.61 - 1.24 mg/dL   Calcium 8.3 (L) 8.9 - 10.3 mg/dL   Total Protein 5.3 (L) 6.5 - 8.1 g/dL   Albumin 3.2 (L) 3.5 - 5.0 g/dL   AST 14 (L) 15 - 41 U/L   ALT 15 (L) 17 - 63 U/L   Alkaline Phosphatase 39 38 - 126 U/L   Total Bilirubin 0.8 0.3 - 1.2 mg/dL   GFR calc non Af Amer >60 >60 mL/min   GFR calc Af Amer >60 >60 mL/min   Anion gap 4 (L) 5 - 15  CBC with Differential  Result Value Ref Range   WBC 6.2 4.0 - 10.5 K/uL   RBC 4.03 (L) 4.22 - 5.81 MIL/uL   Hemoglobin 12.7 (L) 13.0 - 17.0 g/dL   HCT 37.1 (L) 39.0 - 52.0 %   MCV 92.1 78.0 - 100.0 fL   MCH 31.5 26.0 - 34.0 pg   MCHC 34.2 30.0 - 36.0 g/dL   RDW 12.6 11.5 - 15.5 %   Platelets 146 (L) 150 - 400 K/uL   Neutrophils Relative % 39 %   Neutro Abs 2.4 1.7 - 7.7 K/uL   Lymphocytes Relative 50 %   Lymphs Abs  3.1 0.7 - 4.0 K/uL   Monocytes Relative 9 %   Monocytes Absolute 0.6 0.1 - 1.0 K/uL   Eosinophils Relative 2 %   Eosinophils Absolute 0.1 0.0 - 0.7 K/uL   Basophils Relative 0 %   Basophils Absolute 0.0 0.0 - 0.1 K/uL    Laboratory interpretation all normal except for elevated valproic acid level, renal insufficiency, anemia    EKG  EKG Interpretation None       Radiology Ct Head Wo Contrast  Result Date: 07/05/2017 CLINICAL DATA:  Headache, left-sided blurry vision. e. IMPRESSION: Normal head CT. Electronically Signed   By: Marijo Conception, M.D.   On: 07/05/2017 09:55    Procedures Procedures (including critical care time)  Medications Ordered in ED Medications  metoCLOPramide (REGLAN) injection 10 mg (10 mg Intravenous Given 07/07/17 0216)  diphenhydrAMINE (BENADRYL) injection 25 mg (25 mg Intravenous Given 07/07/17 0215)  sodium chloride 0.9 % bolus 1,000 mL (0 mLs Intravenous Stopped 07/07/17 0258)  sodium chloride 0.9 %  bolus 1,000 mL (0 mLs Intravenous Stopped 07/07/17 0420)     Initial Impression / Assessment and Plan / ED Course  I have reviewed the triage vital signs and the nursing notes.  Pertinent labs & imaging results that were available during my care of the patient were reviewed by me and considered in my medical decision making (see chart for details).     Patient was given IV fluids and IV Reglan and Benadryl for his headache.  Laboratory testing was ordered.  Recheck at 4:10 AM patient states his headaches gone.  We discussed his test results that are back.  His Keppra level is still pending.  He states they did increase his Depakote from 2 tablets twice a day to 3 tablets twice a day.  We discussed to changing it to 2 tablets in the morning and 3 at night.  He can call his PCP or check in my chart to get the results of his Keppra level.  Blood pressure is 176/85 without specific treatment other than treating his headache.  04:00 am Patient states he is  reluctant to leave because he is afraid his headache will return.  However at this point there is no reason to keep him in the emergency department any longer.  He states he does not have anybody to come pick him up this time of the day, however while I was in the room his phone Going off from patient getting multiple texts.  I asked him to see if whoever was up texting him could come pick him up.    Final Clinical Impressions(s) / ED Diagnoses   Final diagnoses:  Seizure ([place])  Seizure disorder ([place])  Valproic acid toxicity, accidental or unintentional, initial encounter  Essential hypertension    ED Discharge Orders    None     Plan discharge  Rolland Porter, MD, Barbette Or, MD 07/07/17 [phone]

## 2017-07-07 NOTE — ED Notes (Signed)
Pt alert & oriented x4, stable gait. Patient given discharge instructions, paperwork & prescription(s). Patient  instructed to stop at the registration desk to finish any additional paperwork. Patient verbalized understanding. Pt left department w/ no further questions. 

## 2017-07-07 NOTE — ED Triage Notes (Signed)
Pt in by [facility] for c/o seizure at home, states he awoke on the floor, thought he had a seizure.  Pt also c/o headache.

## 2017-07-11 LAB — LEVETIRACETAM LEVEL: Levetiracetam Lvl: NOT DETECTED ug/mL (ref 10.0–40.0)

## 2017-07-13 ENCOUNTER — Telehealth: Payer: Self-pay

## 2017-07-13 NOTE — Telephone Encounter (Signed)
Pt is on a list of Top ED visitors. Pt was called to the number provided [phone]. No answer. Left message stating who I was and reason for the call. As well as number to reach me at.  Will attempt to call pt at a different date and time.  Skii Cleland R. [place], LPN [phone]

## 2017-07-17 ENCOUNTER — Telehealth: Payer: Self-pay

## 2017-07-17 NOTE — Telephone Encounter (Signed)
Pt is on a list of Top ED visitors. Pt returned phone call today 07/17/17 after hearing message left on 07/13/17. Stated to patient I was calling from [facility] and had some questions to ask. Pt was a little confused but was open to answering some questions. Pt states he is seeing doctor Legrand Rams and last seen him about two weeks ago. Pt states he gets disability and has medicaid. Pt states he does not work. Pt states he has a permanent home and feels physically and emotionally safe. Pt states he uses the [employer] bus to get around. Pt states he is up to date with his medications for hypertension and seizures. Pt states he gets food stamps ($16/month) and also goes to food banks. Pt states that the reason for his frequent ER visits are for pain. Pt states Dr. Legrand Rams was going to refer pt into a pain clinic but hasn't heard anything about it in about 2-3 months. Advised pt to give Dr. Legrand Rams a call and ask the status for the referral into the Pain clinic.  Offered pt the flu shot but pt stated he has already received the vaccine.    Amela Handley R. [place], LPN [phone]

## 2017-07-27 ENCOUNTER — Other Ambulatory Visit: Payer: Self-pay | Admitting: "Endocrinology

## 2017-08-03 ENCOUNTER — Other Ambulatory Visit: Payer: Self-pay

## 2017-08-03 ENCOUNTER — Emergency Department (HOSPITAL_COMMUNITY): Payer: Medicaid Other

## 2017-08-03 ENCOUNTER — Encounter (HOSPITAL_COMMUNITY): Payer: Self-pay | Admitting: *Deleted

## 2017-08-03 ENCOUNTER — Emergency Department (HOSPITAL_COMMUNITY)
Admission: EM | Admit: 2017-08-03 | Discharge: 2017-08-03 | Disposition: A | Discharge status: Home / Self Care | Payer: Medicaid Other | Attending: Emergency Medicine | Admitting: Emergency Medicine

## 2017-08-03 DIAGNOSIS — J209 Acute bronchitis, unspecified: Secondary | ICD-10-CM | POA: Diagnosis not present

## 2017-08-03 DIAGNOSIS — I2510 Atherosclerotic heart disease of native coronary artery without angina pectoris: Secondary | ICD-10-CM | POA: Insufficient documentation

## 2017-08-03 DIAGNOSIS — Z794 Long term (current) use of insulin: Secondary | ICD-10-CM | POA: Diagnosis not present

## 2017-08-03 DIAGNOSIS — Z79899 Other long term (current) drug therapy: Secondary | ICD-10-CM | POA: Insufficient documentation

## 2017-08-03 DIAGNOSIS — Z951 Presence of aortocoronary bypass graft: Secondary | ICD-10-CM | POA: Insufficient documentation

## 2017-08-03 DIAGNOSIS — F17210 Nicotine dependence, cigarettes, uncomplicated: Secondary | ICD-10-CM | POA: Insufficient documentation

## 2017-08-03 DIAGNOSIS — E119 Type 2 diabetes mellitus without complications: Secondary | ICD-10-CM | POA: Diagnosis not present

## 2017-08-03 DIAGNOSIS — R0789 Other chest pain: Secondary | ICD-10-CM | POA: Diagnosis present

## 2017-08-03 DIAGNOSIS — Z7902 Long term (current) use of antithrombotics/antiplatelets: Secondary | ICD-10-CM | POA: Insufficient documentation

## 2017-08-03 LAB — CBC WITH DIFFERENTIAL/PLATELET
Basophils Absolute: 0 K/uL (ref 0.0–0.1)
Basophils Relative: 1 %
Eosinophils Absolute: 0.1 K/uL (ref 0.0–0.7)
Eosinophils Relative: 3 %
HCT: 37.3 % — ABNORMAL LOW (ref 39.0–52.0)
Hemoglobin: 12.7 g/dL — ABNORMAL LOW (ref 13.0–17.0)
Lymphocytes Relative: 43 %
Lymphs Abs: 1.7 K/uL (ref 0.7–4.0)
MCH: 30.6 pg (ref 26.0–34.0)
MCHC: 34 g/dL (ref 30.0–36.0)
MCV: 89.9 fL (ref 78.0–100.0)
Monocytes Absolute: 0.6 K/uL (ref 0.1–1.0)
Monocytes Relative: 16 %
Neutro Abs: 1.5 K/uL — ABNORMAL LOW (ref 1.7–7.7)
Neutrophils Relative %: 37 %
Platelets: 149 K/uL — ABNORMAL LOW (ref 150–400)
RBC: 4.15 MIL/uL — ABNORMAL LOW (ref 4.22–5.81)
RDW: 12.4 % (ref 11.5–15.5)
WBC: 3.9 K/uL — ABNORMAL LOW (ref 4.0–10.5)

## 2017-08-03 LAB — BASIC METABOLIC PANEL
Anion gap: 9 (ref 5–15)
BUN: 11 mg/dL (ref 6–20)
CO2: 28 mmol/L (ref 22–32)
Calcium: 8.1 mg/dL — ABNORMAL LOW (ref 8.9–10.3)
Chloride: 103 mmol/L (ref 101–111)
Creatinine, Ser: 1.26 mg/dL — ABNORMAL HIGH (ref 0.61–1.24)
Glucose, Bld: 107 mg/dL — ABNORMAL HIGH (ref 65–99)
Potassium: 4.2 mmol/L (ref 3.5–5.1)
SODIUM: 140 mmol/L (ref 135–145)

## 2017-08-03 LAB — I-STAT TROPONIN, ED: Troponin i, poc: 0.02 ng/mL (ref 0.00–0.08)

## 2017-08-03 LAB — INFLUENZA PANEL BY PCR (TYPE A & B)
INFLAPCR: NEGATIVE
INFLBPCR: NEGATIVE

## 2017-08-03 MED ORDER — ALBUTEROL SULFATE HFA 108 (90 BASE) MCG/ACT IN AERS
2.0000 | INHALATION_SPRAY | RESPIRATORY_TRACT | Status: DC | PRN
  Administered 2017-08-03: 2 via RESPIRATORY_TRACT
  Filled 2017-08-03: qty 6.7

## 2017-08-03 MED ORDER — SODIUM CHLORIDE 0.9 % IV BOLUS (SEPSIS)
1000.0000 mL | Freq: Once | INTRAVENOUS | Status: AC
  Administered 2017-08-03: 1000 mL via INTRAVENOUS

## 2017-08-03 MED ORDER — BENZONATATE 100 MG PO CAPS: 100.0000 mg | ORAL_CAPSULE | Freq: Three times a day (TID) | ORAL | 0 refills | Status: DC | PRN

## 2017-08-03 MED ORDER — PREDNISONE 20 MG PO TABS: 40.0000 mg | ORAL_TABLET | Freq: Every day | ORAL | 0 refills | Status: DC

## 2017-08-03 NOTE — ED Triage Notes (Signed)
Pt brought in by [facility] for chest cold; pt has been coughing up yellow sputum;

## 2017-08-03 NOTE — ED Provider Notes (Signed)
[facility] EMERGENCY DEPARTMENT Provider Note   CSN: [id number] Arrival date & time: 08/03/17  0230     History   Chief Complaint Chief Complaint  Patient presents with  . Cough    HPI Melvin Taylor is a 52 y.o. male.  Patient presents to the emergency department for evaluation of chest pain.  Patient reports that he has been experiencing cough, chest congestion, chest pain with diarrhea for the last 2 days.  He is concerned because he has a history of bypass surgery.  Current symptoms are not similar to his cardiac symptoms.  Patient has not noticed any fever.  Cough is productive of thin phlegm.      Past Medical History:  Diagnosis Date  . Anxiety   . Bipolar 1 disorder ([place])   . Cervical radiculopathy   . Chest pain   . Chronic back pain   . Chronic chest wall pain   . Chronic neck pain   . Coronary artery disease   . Depression   . Diabetes mellitus    Type II  . Hallucinations    "long history of them"  . Headache(784.0)   . History of gout   . HTN (hypertension)   . Insomnia   . Neuropathy   . Pain management   . Right leg pain    chronic  . Schizophrenia ([place])   . Seizures ([place])    last sz between July 5-9th, 2016; epilepsy  . Shortness of breath dyspnea    with exertion  . Sleep apnea   . Transfusion of blood product refused for religious reason     Patient Active Problem List   Diagnosis Date Noted  . CAD (coronary artery disease) 03/28/2016  . Chest pain 03/21/2016  . Hyperlipidemia 03/11/2016  . Ischemic chest pain   . NSTEMI (non-ST elevated myocardial infarction) ([place])   . Coronary atherosclerosis of native coronary artery 03/03/2016  . Cervical spinal stenosis 12/14/2015  . Loss of weight 08/25/2015  . Essential hypertension, benign 07/23/2015  . Personal history of noncompliance with medical treatment, presenting hazards to health 07/23/2015  . Abdominal pain 06/08/2015  . Constipation 06/08/2015  . History of colonic polyps     . Diverticulosis of colon without hemorrhage   . Mucosal abnormality of stomach   . Encounter for screening colonoscopy 04/16/2015  . Dysphagia 04/16/2015  . Partial symptomatic epilepsy with complex partial seizures, intractable, without status epilepticus ([place]) 11/10/2014  . DM type 2 causing vascular disease ([place]) 06/29/2014  . Musculoskeletal pain 06/29/2014  . Hyperglycemia without ketosis 09/07/2013  . 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 Knee pain 12/28/2010  . Medial meniscus, posterior horn derangement 12/28/2010    Past Surgical History:  Procedure Laterality Date  . ANTERIOR CERVICAL DECOMP/DISCECTOMY FUSION N/A 12/14/2015   Procedure: ANTERIOR CERVICAL DECOMPRESSION/DISCECTOMY FUSION CERVICAL FIVE -SIX;  Surgeon: Earnie Larsson, MD;  Location: [facility];  Service: Neurosurgery;  Laterality: N/A;  . BIOPSY  05/07/2015   Procedure: BIOPSY (Gastric);  Surgeon: [name], MD;  Location: [facility];  Service: Endoscopy;;  . CARDIAC CATHETERIZATION N/A 03/07/2016   Procedure: Left Heart Cath and Coronary Angiography;  Surgeon: Belva Crome, MD;  Location: [facility];  Service: Cardiovascular;  Laterality: N/A;  . COLONOSCOPY WITH PROPOFOL N/A 05/07/2015   EYC:[id number] diverticulosis, multiple colon polyps removed, tubular adenoma, serrated colon polyp. Next colonoscopy October 2019  . CORONARY ARTERY BYPASS GRAFT N/A 03/28/2016   Procedure: CORONARY ARTERY  BYPASS GRAFTING (CABG) x 5 USING GREATER SAPHENOUS VEIN;  Surgeon: [name], MD;  Location: [facility];  Service: Open Heart Surgery;  Laterality: N/A;  . ENDOVEIN HARVEST OF GREATER SAPHENOUS VEIN Right 03/28/2016   Procedure: ENDOVEIN HARVEST OF GREATER SAPHENOUS VEIN;  Surgeon: [name], MD;  Location: [facility];  Service: Open Heart Surgery;  Laterality: Right;  . ESOPHAGEAL DILATION N/A 05/07/2015   Procedure: ESOPHAGEAL DILATION WITH 56FR MALONEY DILATOR;  Surgeon: [name], MD;  Location: [facility];  Service: Endoscopy;  Laterality: N/A;  . ESOPHAGOGASTRODUODENOSCOPY (EGD) WITH PROPOFOL N/A 05/07/2015   RMR: Status post dilation of normal esophagus. Gastritis.  Marland Kitchen KNEE SURGERY Left    arthroscopy  . MANDIBLE FRACTURE SURGERY    . POLYPECTOMY  05/07/2015   Procedure: POLYPECTOMY (Hepatic Flexure, Distal Transverse Colon, Rectal);  Surgeon: [name], MD;  Location: [facility];  Service: Endoscopy;;  . TEE WITHOUT CARDIOVERSION N/A 03/28/2016   Procedure: TRANSESOPHAGEAL ECHOCARDIOGRAM (TEE);  Surgeon: [name], MD;  Location: [facility];  Service: Open Heart Surgery;  Laterality: N/A;  . TONSILLECTOMY         Home Medications    Prior to Admission medications   Medication Sig Start Date End Date Taking? Authorizing Provider  ACCU-CHEK AVIVA PLUS test strip USE AS DIRECTED TO TEST 4 TIMES DAILY. 07/28/17   Cassandria Anger, MD  amLODipine (NORVASC) 10 MG tablet TAKE 1 TABLET BY MOUTH ONCE DAILY. 07/07/17   Herminio Commons, MD  benzonatate (TESSALON) 100 MG capsule Take 1 capsule (100 mg total) by mouth every 8 (eight) hours as needed for cough. 08/03/17   Orpah Greek, MD  clopidogrel (PLAVIX) 75 MG tablet Take 1 tablet (75 mg total) by mouth daily. 04/18/16   Lendon Colonel, NP  cyclobenzaprine (FLEXERIL) 10 MG tablet Take 1 tablet (10 mg total) by mouth 3 (three) times daily as needed. 07/05/17   Triplett, Tammy, PA-C  diazepam (VALIUM) 5 MG tablet Take 5 mg by mouth every 6 (six) hours as needed for anxiety.    [provider]  divalproex (DEPAKOTE) 500 MG DR tablet Take 3 tablets (1,500 mg total) by mouth 2 (two) times daily. 01/04/17   Penumalli, Earlean Polka, MD  Doxepin HCl 5 % CREA Apply 2 g topically 3 (three) times daily. Pt applies to chest and legs.    [provider]  Insulin Glargine (LANTUS SOLOSTAR) 100 UNIT/ML Solostar Pen Inject 30 Units into the skin at bedtime. 03/03/17   Cassandria Anger, MD  isosorbide mononitrate (IMDUR) 60  MG 24 hr tablet Take 90 mg by mouth daily.    [provider]  levETIRAcetam (KEPPRA) 1000 MG tablet Take 1 tablet (1,000 mg total) by mouth 2 (two) times daily. 01/04/17   Penumalli, Earlean Polka, MD  lidocaine (LIDODERM) 5 % Place 1 patch onto the skin daily. Remove & Discard patch within 12 hours or as directed by MD Patient not taking: Reported on 05/15/2017 10/08/16   Ocie Cornfield T, PA-C  lidocaine (XYLOCAINE) 5 % ointment Apply 2-3 application topically 4 (four) times daily. Pt applies to chest and legs.    [provider]  methocarbamol (ROBAXIN) 500 MG tablet Take 1 tablet (500 mg total) by mouth 3 (three) times daily. Patient not taking: Reported on 05/15/2017 04/27/17   Triplett, Tammy, PA-C  metoprolol tartrate (LOPRESSOR) 25 MG tablet Take 25 mg by mouth 2 (two) times daily.    [provider]  NONFORMULARY OR COMPOUNDED ITEM APPLY 1 GRAM (1 PUMP) TO THE AFFECTED AREA(S) UP TO 4 TIMES A DAY                        DRUG NAME: B5 D3 K10 Pent+ 03/08/17   [provider]  oxyCODONE-acetaminophen (PERCOCET/ROXICET) 5-325 MG tablet Take 1 tablet by mouth every 4 (four) hours as needed. 05/30/17   Triplett, Tammy, PA-C  predniSONE (DELTASONE) 20 MG tablet Take 2 tablets (40 mg total) by mouth daily with breakfast. 08/03/17   Pollina, Gwenyth Allegra, MD  RANEXA 500 MG 12 hr tablet TAKE (1) TABLET BY MOUTH TWICE DAILY. 05/31/17   Herminio Commons, MD  traMADol (ULTRAM) 50 MG tablet Take 1 tablet (50 mg total) by mouth every 6 (six) hours as needed for severe pain. 04/14/17   Fransico Meadow, PA-C    Family History Family History  Problem Relation Age of Onset  . Diabetes Father   . Hypertension Father   . Arthritis Unknown   . Diabetes Unknown   . Asthma Unknown   . Stroke Sister     Social History Social History   Tobacco Use  . Smoking status: Current Every Day Smoker    Packs/day: 0.25    Years: 30.00    Pack years: 7.50    Types: Cigarettes  .  Smokeless tobacco: Never Used  . Tobacco comment: 3 cigs per day  Substance Use Topics  . Alcohol use: No  . Drug use: No     Allergies   Aspirin   Review of Systems Review of Systems  HENT: Positive for congestion.   Respiratory: Positive for cough.   Cardiovascular: Positive for chest pain.  Gastrointestinal: Positive for diarrhea.  All other systems reviewed and are negative.    Physical Exam Updated Vital Signs BP (!) 159/102   Pulse (!) 57   Temp 98.5 F (36.9 C) (Oral)   Resp 19   Ht 5\' 7"  (1.702 m)   Wt 74.8 kg (165 lb)   SpO2 96%   BMI 25.84 kg/m   Physical Exam  Constitutional: He is oriented to person, place, and time. He appears well-developed and well-nourished. No distress.  HENT:  Head: Normocephalic and atraumatic.  Right Ear: Hearing normal.  Left Ear: Hearing normal.  Nose: Nose normal.  Mouth/Throat: Oropharynx is clear and moist and mucous membranes are normal.  Eyes: Conjunctivae and EOM are normal. Pupils are equal, round, and reactive to light.  Neck: Normal range of motion. Neck supple.  Cardiovascular: Regular rhythm, S1 normal and S2 normal. Exam reveals no gallop and no friction rub.  No murmur heard. Pulmonary/Chest: Effort normal and breath sounds normal. No respiratory distress. He exhibits tenderness.    Abdominal: Soft. Normal appearance and bowel sounds are normal. There is no hepatosplenomegaly. There is no tenderness. There is no rebound, no guarding, no tenderness at McBurney's point and negative Murphy's sign. No hernia.  Musculoskeletal: Normal range of motion.  Neurological: He is alert and oriented to person, place, and time. He has normal strength. No cranial nerve deficit or sensory deficit. Coordination normal. GCS eye subscore is 4. GCS verbal subscore is 5. GCS motor subscore is 6.  Skin: Skin is warm, dry and intact. No rash noted. No cyanosis.  Psychiatric: He has a normal mood and affect. His speech is normal and  behavior is normal. Thought content normal.  Nursing note and vitals reviewed.  ED Treatments / Results  Labs (all labs ordered are listed, but only abnormal results are displayed) Labs Reviewed  CBC WITH DIFFERENTIAL/PLATELET - Abnormal; Notable for the following components:      Result Value   WBC 3.9 (*)    RBC 4.15 (*)    Hemoglobin 12.7 (*)    HCT 37.3 (*)    Platelets 149 (*)    Neutro Abs 1.5 (*)    All other components within normal limits  BASIC METABOLIC PANEL - Abnormal; Notable for the following components:   Glucose, Bld 107 (*)    Creatinine, Ser 1.26 (*)    Calcium 8.1 (*)    All other components within normal limits  INFLUENZA PANEL BY PCR (TYPE A & B)  I-STAT TROPONIN, ED    EKG  EKG Interpretation  Date/Time:  Thursday August 03 2017 02:35:55 EST Ventricular Rate:  59 PR Interval:    QRS Duration: 88 QT Interval:  436 QTC Calculation: 432 R Axis:   -46 Text Interpretation:  Sinus rhythm LAD, consider left anterior fascicular block Probable anteroseptal infarct, old Confirmed by Orpah Greek [phone] on 08/03/2017 2:38:18 AM       Radiology Dg Chest 2 View  Result Date: 08/03/2017 CLINICAL DATA:  Pt brought in by [facility] for chest cold; pt has been coughing up yellow sputum. Hx of diabetes, coronary artery disease, HTN, SOB, CABG, cardiac cath, and current smoker. EXAM: CHEST  2 VIEW COMPARISON:  05/15/2017 FINDINGS: Postoperative changes in the mediastinum. Normal heart size and pulmonary vascularity. No focal airspace disease or consolidation in the lungs. No blunting of costophrenic angles. No pneumothorax. Mediastinal contours appear intact. Calcification of the aorta. IMPRESSION: No evidence of active pulmonary disease.  Aortic atherosclerosis. Electronically Signed   By: Lucienne Capers M.D.   On: 08/03/2017 03:24    Procedures Procedures (including critical care time)  Medications Ordered in ED Medications  sodium chloride 0.9 %  bolus 1,000 mL (0 mLs Intravenous Stopped 08/03/17 0411)     Initial Impression / Assessment and Plan / ED Course  I have reviewed the triage vital signs and the nursing notes.  Pertinent labs & imaging results that were available during my care of the patient were reviewed by me and considered in my medical decision making (see chart for details).     Patient presented to the emergency department for evaluation of chest pain.  Patient has a history of cardiac disease, status post bypass surgery.  He reports 2 previous heart attacks.  Symptoms today do not resemble his heart attacks in any way.  He has cough and chest congestion.  He reports soreness in his chest with his cough.  Symptoms are extremely atypical for cardiac etiology.  EKG is unchanged from previous, troponin negative x2.  Chest x-ray does not show pneumonia.  Patient reassured, symptoms most consistent with bronchitis and will be treated accordingly.  Final Clinical Impressions(s) / ED Diagnoses   Final diagnoses:  Acute bronchitis, unspecified organism  Atypical chest pain    ED Discharge Orders        Ordered    predniSONE (DELTASONE) 20 MG tablet  Daily with breakfast     08/03/17 0556    benzonatate (TESSALON) 100 MG capsule  Every 8 hours PRN     08/03/17 0556       Orpah Greek, MD 08/03/17 [phone]

## 2017-08-17 ENCOUNTER — Encounter (HOSPITAL_COMMUNITY): Payer: Self-pay

## 2017-08-17 ENCOUNTER — Emergency Department (HOSPITAL_COMMUNITY): Payer: Medicaid Other

## 2017-08-17 ENCOUNTER — Emergency Department (HOSPITAL_COMMUNITY)
Admission: EM | Admit: 2017-08-17 | Discharge: 2017-08-17 | Disposition: A | Discharge status: Home / Self Care | Payer: Medicaid Other | Attending: Emergency Medicine | Admitting: Emergency Medicine

## 2017-08-17 DIAGNOSIS — I252 Old myocardial infarction: Secondary | ICD-10-CM | POA: Insufficient documentation

## 2017-08-17 DIAGNOSIS — F17210 Nicotine dependence, cigarettes, uncomplicated: Secondary | ICD-10-CM | POA: Insufficient documentation

## 2017-08-17 DIAGNOSIS — I2510 Atherosclerotic heart disease of native coronary artery without angina pectoris: Secondary | ICD-10-CM | POA: Insufficient documentation

## 2017-08-17 DIAGNOSIS — E785 Hyperlipidemia, unspecified: Secondary | ICD-10-CM | POA: Diagnosis not present

## 2017-08-17 DIAGNOSIS — R079 Chest pain, unspecified: Secondary | ICD-10-CM | POA: Diagnosis not present

## 2017-08-17 DIAGNOSIS — I10 Essential (primary) hypertension: Secondary | ICD-10-CM | POA: Insufficient documentation

## 2017-08-17 DIAGNOSIS — E119 Type 2 diabetes mellitus without complications: Secondary | ICD-10-CM | POA: Insufficient documentation

## 2017-08-17 DIAGNOSIS — I160 Hypertensive urgency: Secondary | ICD-10-CM

## 2017-08-17 DIAGNOSIS — Z79899 Other long term (current) drug therapy: Secondary | ICD-10-CM | POA: Insufficient documentation

## 2017-08-17 DIAGNOSIS — Z794 Long term (current) use of insulin: Secondary | ICD-10-CM | POA: Diagnosis not present

## 2017-08-17 DIAGNOSIS — Z7902 Long term (current) use of antithrombotics/antiplatelets: Secondary | ICD-10-CM | POA: Diagnosis not present

## 2017-08-17 DIAGNOSIS — G8929 Other chronic pain: Secondary | ICD-10-CM

## 2017-08-17 DIAGNOSIS — Z951 Presence of aortocoronary bypass graft: Secondary | ICD-10-CM | POA: Diagnosis not present

## 2017-08-17 LAB — CBC
HCT: 35.4 % — ABNORMAL LOW (ref 39.0–52.0)
HEMOGLOBIN: 12.2 g/dL — AB (ref 13.0–17.0)
MCH: 31 pg (ref 26.0–34.0)
MCHC: 34.5 g/dL (ref 30.0–36.0)
MCV: 90.1 fL (ref 78.0–100.0)
PLATELETS: 197 10*3/uL (ref 150–400)
RBC: 3.93 MIL/uL — AB (ref 4.22–5.81)
RDW: 12.5 % (ref 11.5–15.5)
WBC: 7.2 10*3/uL (ref 4.0–10.5)

## 2017-08-17 LAB — BASIC METABOLIC PANEL
ANION GAP: 8 (ref 5–15)
BUN: 20 mg/dL (ref 6–20)
CALCIUM: 8.7 mg/dL — AB (ref 8.9–10.3)
CO2: 26 mmol/L (ref 22–32)
Chloride: 106 mmol/L (ref 101–111)
Creatinine, Ser: 1.41 mg/dL — ABNORMAL HIGH (ref 0.61–1.24)
GFR, EST NON AFRICAN AMERICAN: 55 mL/min — AB (ref >60)
Glucose, Bld: 147 mg/dL — ABNORMAL HIGH (ref 65–99)
Potassium: 3.8 mmol/L (ref 3.5–5.1)
SODIUM: 140 mmol/L (ref 135–145)

## 2017-08-17 LAB — TROPONIN I

## 2017-08-17 MED ORDER — NITROGLYCERIN 0.4 MG SL SUBL
0.4000 mg | SUBLINGUAL_TABLET | SUBLINGUAL | Status: DC | PRN
  Administered 2017-08-17: 0.4 mg via SUBLINGUAL
  Filled 2017-08-17: qty 1

## 2017-08-17 MED ORDER — NAPROXEN 500 MG PO TABS: 500.0000 mg | ORAL_TABLET | Freq: Two times a day (BID) | ORAL | 0 refills | Status: DC

## 2017-08-17 MED ORDER — ACETAMINOPHEN 325 MG PO TABS: 650.0000 mg | ORAL_TABLET | Freq: Once | ORAL | Status: DC

## 2017-08-17 MED ORDER — KETOROLAC TROMETHAMINE 30 MG/ML IJ SOLN
15.0000 mg | Freq: Once | INTRAMUSCULAR | Status: AC
  Administered 2017-08-17: 15 mg via INTRAVENOUS
  Filled 2017-08-17: qty 1

## 2017-08-17 NOTE — ED Notes (Signed)
EDP at bedside  

## 2017-08-17 NOTE — ED Provider Notes (Signed)
[facility] EMERGENCY DEPARTMENT Provider Note   CSN: [id number] Arrival date & time: 08/17/17  1527     History   Chief Complaint Chief Complaint  Patient presents with  . Chest Pain    HPI Melvin Taylor is a 52 y.o. male.  HPI  52 year old male with history of coronary artery disease status post CABG in 2017, poorly controlled diabetes and high blood pressure, active smoker with half a pack a day of smoking currently comes in with chief complaint of chest pain.  Patient also has chronic chest wall pain and left arm pain that is musculoskeletal in nature.  Patient reports that he was helping someone lift a tree, and started having chest pain.  Chest pain is generalized, and moving towards the left shoulder.  Patient thinks the pain is similar to his ACS pain.  Patient does not have nitroglycerin at home therefore he has not taken it.  Patient denies any associated nausea, shortness of breath, diaphoresis, dizziness.  Patient also denies any exertional component to this pain or pleuritic component to his pain.  Patient reports that he has had similar pain in the past.  Records indicate that patient has chronic musculoskeletal chest pain which radiates to the left side.  Past Medical History:  Diagnosis Date  . Anxiety   . Bipolar 1 disorder ([place])   . Cervical radiculopathy   . Chest pain   . Chronic back pain   . Chronic chest wall pain   . Chronic neck pain   . Coronary artery disease   . Depression   . Diabetes mellitus    Type II  . Hallucinations    "long history of them"  . Headache(784.0)   . History of gout   . HTN (hypertension)   . Insomnia   . Neuropathy   . Pain management   . Right leg pain    chronic  . Schizophrenia ([place])   . Seizures ([place])    last sz between July 5-9th, 2016; epilepsy  . Shortness of breath dyspnea    with exertion  . Sleep apnea   . Transfusion of blood product refused for religious reason     Patient Active Problem List     Diagnosis Date Noted  . CAD (coronary artery disease) 03/28/2016  . Chest pain 03/21/2016  . Hyperlipidemia 03/11/2016  . Ischemic chest pain   . NSTEMI (non-ST elevated myocardial infarction) ([place])   . Coronary atherosclerosis of native coronary artery 03/03/2016  . Cervical spinal stenosis 12/14/2015  . Loss of weight 08/25/2015  . Essential hypertension, benign 07/23/2015  . Personal history of noncompliance with medical treatment, presenting hazards to health 07/23/2015  . Abdominal pain 06/08/2015  . Constipation 06/08/2015  . History of colonic polyps   . Diverticulosis of colon without hemorrhage   . Mucosal abnormality of stomach   . Encounter for screening colonoscopy 04/16/2015  . Dysphagia 04/16/2015  . Partial symptomatic epilepsy with complex partial seizures, intractable, without status epilepticus ([place]) 11/10/2014  . DM type 2 causing vascular disease ([place]) 06/29/2014  . Musculoskeletal pain 06/29/2014  . Hyperglycemia without ketosis 09/07/2013  . 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 Knee pain 12/28/2010  . Medial meniscus, posterior horn derangement 12/28/2010    Past Surgical History:  Procedure Laterality Date  . ANTERIOR CERVICAL DECOMP/DISCECTOMY FUSION N/A 12/14/2015   Procedure: ANTERIOR CERVICAL DECOMPRESSION/DISCECTOMY FUSION CERVICAL FIVE -SIX;  Surgeon: Earnie Larsson, MD;  Location: [place] [facility];  Service: Neurosurgery;  Laterality: N/A;  . BIOPSY  05/07/2015   Procedure: BIOPSY (Gastric);  Surgeon: [name], MD;  Location: [facility];  Service: Endoscopy;;  . CARDIAC CATHETERIZATION N/A 03/07/2016   Procedure: Left Heart Cath and Coronary Angiography;  Surgeon: Belva Crome, MD;  Location: [place] [facility];  Service: Cardiovascular;  Laterality: N/A;  . COLONOSCOPY WITH PROPOFOL N/A 05/07/2015   [id number] diverticulosis, multiple colon polyps removed, tubular adenoma, serrated colon polyp. Next colonoscopy October 2019  . CORONARY  ARTERY BYPASS GRAFT N/A 03/28/2016   Procedure: CORONARY ARTERY BYPASS GRAFTING (CABG) x 5 USING GREATER SAPHENOUS VEIN;  Surgeon: [name], MD;  Location: [place] OR;  Service: Open Heart Surgery;  Laterality: N/A;  . ENDOVEIN HARVEST OF GREATER SAPHENOUS VEIN Right 03/28/2016   Procedure: ENDOVEIN HARVEST OF GREATER SAPHENOUS VEIN;  Surgeon: [name], MD;  Location: [place];  Service: Open Heart Surgery;  Laterality: Right;  . ESOPHAGEAL DILATION N/A 05/07/2015   Procedure: ESOPHAGEAL DILATION WITH 56FR MALONEY DILATOR;  Surgeon: [name], MD;  Location: [facility];  Service: Endoscopy;  Laterality: N/A;  . ESOPHAGOGASTRODUODENOSCOPY (EGD) WITH PROPOFOL N/A 05/07/2015   RMR: Status post dilation of normal esophagus. Gastritis.  Marland Kitchen KNEE SURGERY Left    arthroscopy  . MANDIBLE FRACTURE SURGERY    . POLYPECTOMY  05/07/2015   Procedure: POLYPECTOMY (Hepatic Flexure, Distal Transverse Colon, Rectal);  Surgeon: [name], MD;  Location: [facility];  Service: Endoscopy;;  . TEE WITHOUT CARDIOVERSION N/A 03/28/2016   Procedure: TRANSESOPHAGEAL ECHOCARDIOGRAM (TEE);  Surgeon: [name], MD;  Location: [place];  Service: Open Heart Surgery;  Laterality: N/A;  . TONSILLECTOMY         Home Medications    Prior to Admission medications   Medication Sig Start Date End Date Taking? Authorizing Provider  amLODipine (NORVASC) 10 MG tablet TAKE 1 TABLET BY MOUTH ONCE DAILY. 07/07/17  Yes Herminio Commons, MD  clopidogrel (PLAVIX) 75 MG tablet Take 1 tablet (75 mg total) by mouth daily. 04/18/16  Yes Lendon Colonel, NP  diazepam (VALIUM) 5 MG tablet Take 5 mg by mouth every 6 (six) hours as needed for anxiety.   Yes [provider]  divalproex (DEPAKOTE) 500 MG DR tablet Take 3 tablets (1,500 mg total) by mouth 2 (two) times daily. 01/04/17  Yes Penumalli, Earlean Polka, MD  Doxepin HCl 5 % CREA Apply 2 g topically 3 (three) times daily. Pt applies to chest and legs.   Yes [provider]  Insulin Glargine (LANTUS SOLOSTAR) 100 UNIT/ML Solostar Pen Inject 30 Units into the skin at bedtime. Patient taking differently: Inject 30 Units into the skin at bedtime as needed (for high blood sugars if over 200).  03/03/17  Yes Cassandria Anger, MD  isosorbide mononitrate (IMDUR) 60 MG 24 hr tablet Take 90 mg by mouth daily.   Yes [provider]  levETIRAcetam (KEPPRA) 1000 MG tablet Take 1 tablet (1,000 mg total) by mouth 2 (two) times daily. 01/04/17  Yes Penumalli, Vikram R, MD  lidocaine (XYLOCAINE) 5 % ointment Apply 2-3 application topically 4 (four) times daily. Pt applies to chest and legs.   Yes [provider]  metoprolol tartrate (LOPRESSOR) 25 MG tablet Take 25 mg by mouth 2 (two) times daily.   Yes [provider]  NONFORMULARY OR COMPOUNDED ITEM APPLY 1 GRAM (1 PUMP) TO THE AFFECTED AREA(S) UP TO 4 TIMES A DAY  DRUG NAME: B5 D3 K10 Pent+ 03/08/17  Yes [provider]  predniSONE (DELTASONE) 20 MG tablet Take 2 tablets (40 mg total) by mouth daily with breakfast. 08/03/17  Yes Pollina, Gwenyth Allegra, MD  RANEXA 500 MG 12 hr tablet TAKE (1) TABLET BY MOUTH TWICE DAILY. 05/31/17  Yes Herminio Commons, MD  ACCU-CHEK AVIVA PLUS test strip USE AS DIRECTED TO TEST 4 TIMES DAILY. 07/28/17   Cassandria Anger, MD  benzonatate (TESSALON) 100 MG capsule Take 1 capsule (100 mg total) by mouth every 8 (eight) hours as needed for cough. Patient not taking: Reported on 08/17/2017 08/03/17   Orpah Greek, MD  cyclobenzaprine (FLEXERIL) 10 MG tablet Take 1 tablet (10 mg total) by mouth 3 (three) times daily as needed. Patient not taking: Reported on 08/17/2017 07/05/17   Triplett, Tammy, PA-C  lidocaine (LIDODERM) 5 % Place 1 patch onto the skin daily. Remove & Discard patch within 12 hours or as directed by MD Patient not taking: Reported on 05/15/2017 10/08/16   Ocie Cornfield T, PA-C  methocarbamol (ROBAXIN)  500 MG tablet Take 1 tablet (500 mg total) by mouth 3 (three) times daily. Patient not taking: Reported on 05/15/2017 04/27/17   Triplett, Tammy, PA-C  naproxen (NAPROSYN) 500 MG tablet Take 1 tablet (500 mg total) by mouth 2 (two) times daily. 08/17/17   Varney Biles, MD  oxyCODONE-acetaminophen (PERCOCET/ROXICET) 5-325 MG tablet Take 1 tablet by mouth every 4 (four) hours as needed. Patient not taking: Reported on 08/17/2017 05/30/17   Triplett, Tammy, PA-C  traMADol (ULTRAM) 50 MG tablet Take 1 tablet (50 mg total) by mouth every 6 (six) hours as needed for severe pain. Patient not taking: Reported on 08/17/2017 04/14/17   Sidney Ace    Family History Family History  Problem Relation Age of Onset  . Diabetes Father   . Hypertension Father   . Arthritis Unknown   . Diabetes Unknown   . Asthma Unknown   . Stroke Sister     Social History Social History   Tobacco Use  . Smoking status: Current Every Day Smoker    Packs/day: 0.25    Years: 30.00    Pack years: 7.50    Types: Cigarettes  . Smokeless tobacco: Never Used  . Tobacco comment: 3 cigs per day  Substance Use Topics  . Alcohol use: No  . Drug use: No     Allergies   Aspirin   Review of Systems Review of Systems  Constitutional: Positive for activity change.  Respiratory: Negative for shortness of breath.   Cardiovascular: Positive for chest pain.  Musculoskeletal: Positive for myalgias.  All other systems reviewed and are negative.    Physical Exam Updated Vital Signs BP (!) 195/104   Pulse (!) 43   Temp 98.1 F (36.7 C) (Oral)   Resp 12   Wt 74.8 kg (165 lb)   SpO2 100%   BMI 25.84 kg/m   Physical Exam  Constitutional: He is oriented to person, place, and time. He appears well-developed.  HENT:  Head: Atraumatic.  Neck: Neck supple.  Cardiovascular: Normal rate and intact distal pulses.  Reproducible chest wall tenderness  Pulmonary/Chest: Effort normal.  Neurological: He is  alert and oriented to person, place, and time.  Skin: Skin is warm.  Nursing note and vitals reviewed.    ED Treatments / Results  Labs (all labs ordered are listed, but only abnormal results are displayed) Labs Reviewed  BASIC METABOLIC PANEL -  Abnormal; Notable for the following components:      Result Value   Glucose, Bld 147 (*)    Creatinine, Ser 1.41 (*)    Calcium 8.7 (*)    GFR calc non Af Amer 55 (*)    All other components within normal limits  CBC - Abnormal; Notable for the following components:   RBC 3.93 (*)    Hemoglobin 12.2 (*)    HCT 35.4 (*)    All other components within normal limits  TROPONIN I  TROPONIN I    EKG  EKG Interpretation  Date/Time:  Thursday August 17 2017 15:27:44 EST Ventricular Rate:  49 PR Interval:    QRS Duration: 89 QT Interval:  433 QTC Calculation: 391 R Axis:   -48 Text Interpretation:  Sinus bradycardia Left anterior fascicular block No acute changes No significant change since last tracing Confirmed by Varney Biles [id number]) on 08/17/2017 4:10:53 PM       Radiology Dg Chest 2 View  Result Date: 08/17/2017 CLINICAL DATA:  Central chest pain radiating to the left arm this morning. EXAM: CHEST  2 VIEW COMPARISON:  August 03, 2017 FINDINGS: The heart size and mediastinal contours are within normal limits. There is no focal infiltrate, pulmonary edema, or pleural effusion. The visualized skeletal structures are stable. IMPRESSION: No active cardiopulmonary disease. Electronically Signed   By: Abelardo Diesel M.D.   On: 08/17/2017 17:17    Procedures Procedures (including critical care time)  Medications Ordered in ED Medications  nitroGLYCERIN (NITROSTAT) SL tablet 0.4 mg (0.4 mg Sublingual Given 08/17/17 1640)  acetaminophen (TYLENOL) tablet 650 mg (not administered)  ketorolac (TORADOL) 30 MG/ML injection 15 mg (15 mg Intravenous Given 08/17/17 1840)     Initial Impression / Assessment and Plan / ED Course  I have  reviewed the triage vital signs and the nursing notes.  Pertinent labs & imaging results that were available during my care of the patient were reviewed by me and considered in my medical decision making (see chart for details).  Clinical Course as of Aug 18 1927  Thu Aug 17, 2017  [id number] Results from the ER workup discussed with the patient face to face and all questions answered to the best of my ability.  Patient's blood pressure has been high in the ER, however there is no endorgan damage.  Patient is on 2 blood pressure medications and he has been taking it as prescribed.  Patient advised to see his primary care doctor as soon as possible for optimal blood pressure control.  He is also been informed about his troponins being normal, and our conversation with Dr. Aviva Signs.  Patient again has been recommended to follow-up with his PCP for the chest pain.  Strict ER return precautions have been discussed  [AN]    Clinical Course User Index [AN] Varney Biles, MD   52 year old male with known coronary artery disease status post CABG a year and a half ago comes in with chief complaint of chest pain.  Patient has multiple medical comorbidities, including diabetes high blood pressure and he is an active smoker.  Patient also has chronic musculoskeletal chest pain.  This pain today started when patient was helping move a tree.  Chest pain is reproducible with palpation.  Patient reports however that the pain is radiating to the left shoulder and is typical of his ACS.  I spoke with patient's cardiologist, who recommends delta troponin.  Patient's cardiologist had seen him July 2018 and has a  follow-up already in place.   Final Clinical Impressions(s) / ED Diagnoses   Final diagnoses:  Chronic chest pain  Hypertensive urgency    ED Discharge Orders        Ordered    naproxen (NAPROSYN) 500 MG tablet  2 times daily     08/17/17 1924       Varney Biles, MD 08/17/17 1929

## 2017-08-17 NOTE — ED Notes (Signed)
Patient transported to X-ray 

## 2017-08-17 NOTE — ED Triage Notes (Signed)
Pt reports that his chest began hurting when he woke up approx 5 am, but pain increased approx 2 hrs ago. Pt reports pain is located in the center of chest and goes into left arm. EMS BP 233/115. Now reports HA and that he did take his BP this morning, but became upset about something. Sinus brady on monitor

## 2017-08-17 NOTE — Discharge Instructions (Signed)
All the results in the ER are normal, labs and imaging. We suspect that your pain is musculoskeletal. You also have elevated blood pressure-  which will need outpatient management as soon as possible.  The workup in the ER is not complete, and is limited to screening for life threatening and emergent conditions only, so please see a primary care doctor for further evaluation.  Please return to the ER if you have worsening chest pain, shortness of breath, pain radiating to your jaw, shoulder, or back, sweats or fainting. Otherwise see the Cardiologist or your primary care doctor as requested.

## 2017-08-30 ENCOUNTER — Encounter: Payer: Self-pay | Admitting: *Deleted

## 2017-08-31 ENCOUNTER — Encounter: Payer: Self-pay | Admitting: Cardiovascular Disease

## 2017-08-31 ENCOUNTER — Ambulatory Visit (INDEPENDENT_AMBULATORY_CARE_PROVIDER_SITE_OTHER): Payer: Medicaid Other | Admitting: Cardiovascular Disease

## 2017-08-31 VITALS — BP 142/90 | HR 60 | Ht 67.0 in | Wt 164.0 lb

## 2017-08-31 DIAGNOSIS — R001 Bradycardia, unspecified: Secondary | ICD-10-CM | POA: Diagnosis not present

## 2017-08-31 DIAGNOSIS — R0789 Other chest pain: Secondary | ICD-10-CM | POA: Diagnosis not present

## 2017-08-31 DIAGNOSIS — I10 Essential (primary) hypertension: Secondary | ICD-10-CM

## 2017-08-31 DIAGNOSIS — G8918 Other acute postprocedural pain: Secondary | ICD-10-CM

## 2017-08-31 DIAGNOSIS — M79602 Pain in left arm: Secondary | ICD-10-CM

## 2017-08-31 DIAGNOSIS — Z951 Presence of aortocoronary bypass graft: Secondary | ICD-10-CM | POA: Diagnosis not present

## 2017-08-31 DIAGNOSIS — M542 Cervicalgia: Secondary | ICD-10-CM | POA: Diagnosis not present

## 2017-08-31 DIAGNOSIS — Z9289 Personal history of other medical treatment: Secondary | ICD-10-CM

## 2017-08-31 DIAGNOSIS — E785 Hyperlipidemia, unspecified: Secondary | ICD-10-CM

## 2017-08-31 DIAGNOSIS — I25708 Atherosclerosis of coronary artery bypass graft(s), unspecified, with other forms of angina pectoris: Secondary | ICD-10-CM

## 2017-08-31 MED ORDER — LISINOPRIL 5 MG PO TABS: 5.0000 mg | ORAL_TABLET | Freq: Every day | ORAL | 6 refills | Status: DC

## 2017-08-31 MED ORDER — METOPROLOL TARTRATE 25 MG PO TABS: 12.5000 mg | ORAL_TABLET | Freq: Two times a day (BID) | ORAL | 3 refills | Status: DC

## 2017-08-31 NOTE — Patient Instructions (Signed)
Medication Instructions:   Begin Lisinopril 5mg  daily.  Decrease Lopressor to 12.5mg  twice a day.  Continue all other medications.    Labwork:  BMET - order given today.  Do approximately 3 days after beginning the Lisinopril.  Office will contact with results via phone or letter.    Testing/Procedures: none  Follow-Up: Your physician wants you to follow up in:  1 year.  You will receive a reminder letter in the mail one-two months in advance.  If you don't receive a letter, please call our office to schedule the follow up appointment   Any Other Special Instructions Will Be Listed Below (If Applicable).  If you need a refill on your cardiac medications before your next appointment, please call your pharmacy.

## 2017-08-31 NOTE — Progress Notes (Signed)
SUBJECTIVE:  The patient presents for follow-up of coronary artery disease. He underwent 5 vessel CABG on 03/28/16.  Ever since bypass surgery, he has struggled with musculoskeletal chest wall and left arm pain. It is exacerbated with movement and lifting heavy objects. He could not afford cardiac rehabilitation.  He underwent anterior cervical spine decompression surgery in May 2017. This was performed by Dr. Earnie Larsson.  He is still trying to get in with a pain management clinic.  He has had numerous ED visits for chest pain.  He was most recently seen in the ED on 08/17/17.  I reviewed all relevant records, documentation, labs, and studies.  He had been helping someone lift a tree and began having chest pain radiating to the left shoulder.  He denied any associated shortness of breath, nausea, and diaphoresis.  Chest pain was reproducible with palpation.  Troponins were normal.  He was prescribed Naprosyn.  Creatinine was elevated at 1.41.  Chest x-ray showed no active cardiopulmonary disease.  ECG which I personally reviewed demonstrated sinus bradycardia, 49 bpm, with left axis deviation.  He has occasional dizziness when heart rate is in the 40 bpm range.  He denies exertional chest pain and exertional shortness of breath.  He has some burning in his lungs when inspiring cold air.  He said he goes to the ED about 5 times per month.  He is frustrated by this.  Review of Systems: As per "subjective", otherwise negative.  Allergies  Allergen Reactions  . Aspirin Swelling and Other (See Comments)    Reaction:  Facial swelling     Current Outpatient Medications  Medication Sig Dispense Refill  . ACCU-CHEK AVIVA PLUS test strip USE AS DIRECTED TO TEST 4 TIMES DAILY. 150 each 5  . amLODipine (NORVASC) 10 MG tablet TAKE 1 TABLET BY MOUTH ONCE DAILY. 90 tablet 1  . benzonatate (TESSALON) 100 MG capsule Take 1 capsule (100 mg total) by mouth every 8 (eight) hours as needed for cough.  (Patient not taking: Reported on 08/17/2017) 21 capsule 0  . clopidogrel (PLAVIX) 75 MG tablet Take 1 tablet (75 mg total) by mouth daily. 90 tablet 2  . diazepam (VALIUM) 5 MG tablet Take 5 mg by mouth every 6 (six) hours as needed for anxiety.    . divalproex (DEPAKOTE) 500 MG DR tablet Take 3 tablets (1,500 mg total) by mouth 2 (two) times daily. 540 tablet 4  . Doxepin HCl 5 % CREA Apply 2 g topically 3 (three) times daily. Pt applies to chest and legs.    . Insulin Glargine (LANTUS SOLOSTAR) 100 UNIT/ML Solostar Pen Inject 30 Units into the skin at bedtime. (Patient taking differently: Inject 30 Units into the skin at bedtime as needed (for high blood sugars if over 200). ) 15 mL 0  . isosorbide mononitrate (IMDUR) 60 MG 24 hr tablet Take 90 mg by mouth daily.    Marland Kitchen levETIRAcetam (KEPPRA) 1000 MG tablet Take 1 tablet (1,000 mg total) by mouth 2 (two) times daily. 180 tablet 4  . lidocaine (XYLOCAINE) 5 % ointment Apply 2-3 application topically 4 (four) times daily. Pt applies to chest and legs.    . metoprolol tartrate (LOPRESSOR) 25 MG tablet Take 25 mg by mouth 2 (two) times daily.    . naproxen (NAPROSYN) 500 MG tablet Take 1 tablet (500 mg total) by mouth 2 (two) times daily. 14 tablet 0  . NONFORMULARY OR COMPOUNDED ITEM APPLY 1 GRAM (1 PUMP) TO  THE AFFECTED AREA(S) UP TO 4 TIMES A DAY                        DRUG NAME: B5 D3 K10 Pent+  5  . oxyCODONE-acetaminophen (PERCOCET/ROXICET) 5-325 MG tablet Take 1 tablet by mouth every 4 (four) hours as needed. (Patient not taking: Reported on 08/17/2017) 10 tablet 0  . RANEXA 500 MG 12 hr tablet TAKE (1) TABLET BY MOUTH TWICE DAILY. 60 tablet 6   No current facility-administered medications for this visit.     Past Medical History:  Diagnosis Date  . Anxiety   . Bipolar 1 disorder ([place])   . Cervical radiculopathy   . Chest pain   . Chronic back pain   . Chronic chest wall pain   . Chronic neck pain   . Coronary artery disease   .  Depression   . Diabetes mellitus    Type II  . Hallucinations    "long history of them"  . Headache(784.0)   . History of gout   . HTN (hypertension)   . Insomnia   . Neuropathy   . Pain management   . Right leg pain    chronic  . Schizophrenia ([place])   . Seizures ([place])    last sz between July 5-9th, 2016; epilepsy  . Shortness of breath dyspnea    with exertion  . Sleep apnea   . Transfusion of blood product refused for religious reason     Past Surgical History:  Procedure Laterality Date  . ANTERIOR CERVICAL DECOMP/DISCECTOMY FUSION N/A 12/14/2015   Procedure: ANTERIOR CERVICAL DECOMPRESSION/DISCECTOMY FUSION CERVICAL FIVE -SIX;  Surgeon: Earnie Larsson, MD;  Location: [facility];  Service: Neurosurgery;  Laterality: N/A;  . BIOPSY  05/07/2015   Procedure: BIOPSY (Gastric);  Surgeon: [name], MD;  Location: [facility];  Service: Endoscopy;;  . CARDIAC CATHETERIZATION N/A 03/07/2016   Procedure: Left Heart Cath and Coronary Angiography;  Surgeon: Belva Crome, MD;  Location: [facility];  Service: Cardiovascular;  Laterality: N/A;  . COLONOSCOPY WITH PROPOFOL N/A 05/07/2015   [id number] diverticulosis, multiple colon polyps removed, tubular adenoma, serrated colon polyp. Next colonoscopy October 2019  . CORONARY ARTERY BYPASS GRAFT N/A 03/28/2016   Procedure: CORONARY ARTERY BYPASS GRAFTING (CABG) x 5 USING GREATER SAPHENOUS VEIN;  Surgeon: [name], MD;  Location: [facility];  Service: Open Heart Surgery;  Laterality: N/A;  . ENDOVEIN HARVEST OF GREATER SAPHENOUS VEIN Right 03/28/2016   Procedure: ENDOVEIN HARVEST OF GREATER SAPHENOUS VEIN;  Surgeon: [name], MD;  Location: [facility];  Service: Open Heart Surgery;  Laterality: Right;  . ESOPHAGEAL DILATION N/A 05/07/2015   Procedure: ESOPHAGEAL DILATION WITH 56FR MALONEY DILATOR;  Surgeon: [name], MD;  [facility];  Service: Endoscopy;  Laterality: N/A;  . ESOPHAGOGASTRODUODENOSCOPY (EGD) WITH PROPOFOL  N/A 05/07/2015   RMR: Status post dilation of normal esophagus. Gastritis.  Marland Kitchen KNEE SURGERY Left    arthroscopy  . MANDIBLE FRACTURE SURGERY    . POLYPECTOMY  05/07/2015   Procedure: POLYPECTOMY (Hepatic Flexure, Distal Transverse Colon, Rectal);  Surgeon: [name], MD;  [facility];  Service: Endoscopy;;  . TEE WITHOUT CARDIOVERSION N/A 03/28/2016   Procedure: TRANSESOPHAGEAL ECHOCARDIOGRAM (TEE);  Surgeon: [name], MD;  Location: [facility];  Service: Open Heart Surgery;  Laterality: N/A;  . TONSILLECTOMY      Social History   Socioeconomic History  . Marital  status: Single    Spouse name: Not on file  . Number of children: Not on file  . Years of education: 11th grade  . Highest education level: Not on file  Social Needs  . Financial resource strain: Not on file  . Food insecurity - worry: Not on file  . Food insecurity - inability: Not on file  . Transportation needs - medical: Not on file  . Transportation needs - non-medical: Not on file  Occupational History  . Occupation: unemployed    Fish farm manager: NOT EMPLOYED  Tobacco Use  . Smoking status: Current Every Day Smoker    Packs/day: 0.25    Years: 30.00    Pack years: 7.50    Types: Cigarettes  . Smokeless tobacco: Never Used  . Tobacco comment: 3 cigs per day  Substance and Sexual Activity  . Alcohol use: No  . Drug use: No  . Sexual activity: Not Currently  Other Topics Concern  . Not on file  Social History Narrative   Lives alone   Drinks a cup of coffee a day     Vitals:   08/31/17 1000  BP: (!) 142/90  Pulse: 60  SpO2: 98%  Weight: 164 lb (74.4 kg)  Height: 5\' 7"  (1.702 m)    Wt Readings from Last 3 Encounters:  08/31/17 164 lb (74.4 kg)  08/17/17 165 lb (74.8 kg)  08/03/17 165 lb (74.8 kg)     PHYSICAL EXAM General: NAD HEENT: Normal. Neck: No JVD, no thyromegaly. Lungs: Clear to auscultation bilaterally with normal respiratory effort. CV: Regular rate and rhythm, normal S1/S2,  no S3/S4, no murmur. No pretibial or periankle edema.  No carotid bruit.   Abdomen: Soft, nontender, no distention.  Neurologic: Alert and oriented.  Psych: Normal affect. Skin: Normal. Musculoskeletal: No gross deformities.    ECG: Most recent ECG reviewed.   Labs: Lab Results  Component Value Date/Time   K 3.8 08/17/2017 03:59 PM   BUN 20 08/17/2017 03:59 PM   BUN 19 11/17/2016 10:17 AM   CREATININE 1.41 (H) 08/17/2017 03:59 PM   CREATININE 0.99 03/17/2016 02:36 PM   ALT 15 (L) 07/07/2017 02:19 AM   TSH 0.58 10/19/2015 10:06 AM   HGB 12.2 (L) 08/17/2017 03:59 PM     Lipids: Lab Results  Component Value Date/Time   LDLCALC 53 03/22/2016 08:53 AM   CHOL 120 03/22/2016 08:53 AM   TRIG 37 03/22/2016 08:53 AM   HDL 60 03/22/2016 08:53 AM       ASSESSMENT AND PLAN:  1. CAD s/p 5-v CABG 03/2016:Continue statin, Plavix (allergic to ASA),metoprolol (I will reduce to 12.5 mg twice daily due to bradycardia and episodic dizziness per), Ranexa, and nitrates.   2. Hypertension:This remains elevated.  As ACE inhibitors/angiotensin receptor blockers are indicated and chronic kidney disease stage III, I will start lisinopril 5 mg daily.  I will check a basic metabolic panel within 5 days of initiation.  3. Hyperlipidemia: Continue statin.  4. Musculoskeletal chest wall and left arm pain: I previously made a referral to a pain management clinic. Unfortunately because he has Medicaid, this has to come from his PCP. He tells me his PCP also made a referral but he has not heard from them yet. I also made a referral to physical therapy.  5. Cervical spine pain, migraines, and left arm pain: I recommended he follow up with Dr. Earnie Larsson.     Disposition: Follow up 1 year   Kate Sable, M.D., F.A.C.C.

## 2017-09-11 ENCOUNTER — Emergency Department (HOSPITAL_COMMUNITY): Payer: Medicaid Other

## 2017-09-11 ENCOUNTER — Encounter (HOSPITAL_COMMUNITY): Payer: Self-pay | Admitting: Cardiology

## 2017-09-11 ENCOUNTER — Emergency Department (HOSPITAL_COMMUNITY)
Admission: EM | Admit: 2017-09-11 | Discharge: 2017-09-11 | Disposition: A | Discharge status: Home / Self Care | Payer: Medicaid Other | Attending: Emergency Medicine | Admitting: Emergency Medicine

## 2017-09-11 DIAGNOSIS — F319 Bipolar disorder, unspecified: Secondary | ICD-10-CM | POA: Diagnosis not present

## 2017-09-11 DIAGNOSIS — Z951 Presence of aortocoronary bypass graft: Secondary | ICD-10-CM | POA: Diagnosis not present

## 2017-09-11 DIAGNOSIS — I10 Essential (primary) hypertension: Secondary | ICD-10-CM | POA: Diagnosis not present

## 2017-09-11 DIAGNOSIS — Z79899 Other long term (current) drug therapy: Secondary | ICD-10-CM | POA: Insufficient documentation

## 2017-09-11 DIAGNOSIS — F419 Anxiety disorder, unspecified: Secondary | ICD-10-CM | POA: Diagnosis not present

## 2017-09-11 DIAGNOSIS — Z7902 Long term (current) use of antithrombotics/antiplatelets: Secondary | ICD-10-CM | POA: Insufficient documentation

## 2017-09-11 DIAGNOSIS — E119 Type 2 diabetes mellitus without complications: Secondary | ICD-10-CM | POA: Insufficient documentation

## 2017-09-11 DIAGNOSIS — R079 Chest pain, unspecified: Secondary | ICD-10-CM | POA: Diagnosis not present

## 2017-09-11 DIAGNOSIS — M542 Cervicalgia: Secondary | ICD-10-CM

## 2017-09-11 DIAGNOSIS — Z794 Long term (current) use of insulin: Secondary | ICD-10-CM | POA: Diagnosis not present

## 2017-09-11 DIAGNOSIS — F209 Schizophrenia, unspecified: Secondary | ICD-10-CM | POA: Diagnosis not present

## 2017-09-11 DIAGNOSIS — G8929 Other chronic pain: Secondary | ICD-10-CM | POA: Diagnosis not present

## 2017-09-11 DIAGNOSIS — I2510 Atherosclerotic heart disease of native coronary artery without angina pectoris: Secondary | ICD-10-CM | POA: Diagnosis not present

## 2017-09-11 DIAGNOSIS — F17210 Nicotine dependence, cigarettes, uncomplicated: Secondary | ICD-10-CM | POA: Diagnosis not present

## 2017-09-11 LAB — CBC WITH DIFFERENTIAL/PLATELET
BASOS ABS: 0 10*3/uL (ref 0.0–0.1)
BASOS PCT: 0 %
EOS ABS: 0.5 10*3/uL (ref 0.0–0.7)
Eosinophils Relative: 9 %
HCT: 32.6 % — ABNORMAL LOW (ref 39.0–52.0)
HEMOGLOBIN: 10.8 g/dL — AB (ref 13.0–17.0)
LYMPHS ABS: 2.5 10*3/uL (ref 0.7–4.0)
Lymphocytes Relative: 50 %
MCH: 30.6 pg (ref 26.0–34.0)
MCHC: 33.1 g/dL (ref 30.0–36.0)
MCV: 92.4 fL (ref 78.0–100.0)
Monocytes Absolute: 0.5 10*3/uL (ref 0.1–1.0)
Monocytes Relative: 11 %
NEUTROS PCT: 30 %
Neutro Abs: 1.5 10*3/uL — ABNORMAL LOW (ref 1.7–7.7)
Platelets: 148 10*3/uL — ABNORMAL LOW (ref 150–400)
RBC: 3.53 MIL/uL — AB (ref 4.22–5.81)
RDW: 13 % (ref 11.5–15.5)
WBC: 5 10*3/uL (ref 4.0–10.5)

## 2017-09-11 LAB — BASIC METABOLIC PANEL
Anion gap: 7 (ref 5–15)
BUN: 30 mg/dL — ABNORMAL HIGH (ref 6–20)
CHLORIDE: 108 mmol/L (ref 101–111)
CO2: 26 mmol/L (ref 22–32)
CREATININE: 1.83 mg/dL — AB (ref 0.61–1.24)
Calcium: 8.6 mg/dL — ABNORMAL LOW (ref 8.9–10.3)
GFR, EST AFRICAN AMERICAN: 47 mL/min — AB (ref >60)
GFR, EST NON AFRICAN AMERICAN: 40 mL/min — AB (ref >60)
Glucose, Bld: 108 mg/dL — ABNORMAL HIGH (ref 65–99)
Potassium: 4.7 mmol/L (ref 3.5–5.1)
SODIUM: 141 mmol/L (ref 135–145)

## 2017-09-11 LAB — TROPONIN I

## 2017-09-11 MED ORDER — CYCLOBENZAPRINE HCL 10 MG PO TABS: 10.0000 mg | ORAL_TABLET | Freq: Three times a day (TID) | ORAL | 0 refills | Status: DC | PRN

## 2017-09-11 MED ORDER — HYDROCODONE-ACETAMINOPHEN 5-325 MG PO TABS
1.0000 | ORAL_TABLET | Freq: Once | ORAL | Status: AC
  Administered 2017-09-11: 1 via ORAL
  Filled 2017-09-11: qty 1

## 2017-09-11 NOTE — ED Provider Notes (Signed)
[facility] EMERGENCY DEPARTMENT Provider Note   CSN: [id number] Arrival date & time: 09/11/17  5993     History   Chief Complaint No chief complaint on file.   HPI Melvin Taylor is a 52 y.o. male.  HPI   Melvin Taylor is a 52 y.o. male who presents to the Emergency Department complaining of recurrent neck pain and swelling to both feet.  Symptoms began this morning.  He also complains of substernal, burning chest pain that began this morning as well.  He has multiple ER evaluations for the same.  He states his neck pain is similar to previous.  He describes it as constant and worse with movement of his neck.  He states that he has been out of his pain medication for some time and no longer sees his neurosurgeon.  He states that his neck pain occasionally radiates into his left arm.  He denies known injury of his feet, redness, or pain. No cough, fever, or shortness of breath.  He has not tried any home therapies or over-the-counter medications for symptomatic relief.   Past Medical History:  Diagnosis Date  . Anxiety   . Bipolar 1 disorder ([place])   . Cervical radiculopathy   . Chest pain   . Chronic back pain   . Chronic chest wall pain   . Chronic neck pain   . Coronary artery disease   . Depression   . Diabetes mellitus    Type II  . Hallucinations    "long history of them"  . Headache(784.0)   . History of gout   . HTN (hypertension)   . Insomnia   . Neuropathy   . Pain management   . Right leg pain    chronic  . Schizophrenia ([place])   . Seizures ([place])    last sz between July 5-9th, 2016; epilepsy  . Shortness of breath dyspnea    with exertion  . Sleep apnea   . Transfusion of blood product refused for religious reason     Patient Active Problem List   Diagnosis Date Noted  . CAD (coronary artery disease) 03/28/2016  . Chest pain 03/21/2016  . Hyperlipidemia 03/11/2016  . Ischemic chest pain   . NSTEMI (non-ST elevated myocardial infarction)  ([place])   . Coronary atherosclerosis of native coronary artery 03/03/2016  . Cervical spinal stenosis 12/14/2015  . Loss of weight 08/25/2015  . Essential hypertension, benign 07/23/2015  . Personal history of noncompliance with medical treatment, presenting hazards to health 07/23/2015  . Abdominal pain 06/08/2015  . Constipation 06/08/2015  . History of colonic polyps   . Diverticulosis of colon without hemorrhage   . Mucosal abnormality of stomach   . Encounter for screening colonoscopy 04/16/2015  . Dysphagia 04/16/2015  . Partial symptomatic epilepsy with complex partial seizures, intractable, without status epilepticus ([place]) 11/10/2014  . DM type 2 causing vascular disease ([place]) 06/29/2014  . Musculoskeletal pain 06/29/2014  . Hyperglycemia without ketosis 09/07/2013  . 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 Knee pain 12/28/2010  . Medial meniscus, posterior horn derangement 12/28/2010    Past Surgical History:  Procedure Laterality Date  . ANTERIOR CERVICAL DECOMP/DISCECTOMY FUSION N/A 12/14/2015   Procedure: ANTERIOR CERVICAL DECOMPRESSION/DISCECTOMY FUSION CERVICAL FIVE -SIX;  Surgeon: Earnie Larsson, MD;  Location: [facility];  Service: Neurosurgery;  Laterality: N/A;  . BIOPSY  05/07/2015   Procedure: BIOPSY (Gastric);  Surgeon: [name], MD;  Location[facility];  Service: Endoscopy;;  .  CARDIAC CATHETERIZATION N/A 03/07/2016   Procedure: Left Heart Cath and Coronary Angiography;  Surgeon: Belva Crome, MD;  Location: [facility];  Service: Cardiovascular;  Laterality: N/A;  . COLONOSCOPY WITH PROPOFOL N/A 05/07/2015   [id number] diverticulosis, multiple colon polyps removed, tubular adenoma, serrated colon polyp. Next colonoscopy October 2019  . CORONARY ARTERY BYPASS GRAFT N/A 03/28/2016   Procedure: CORONARY ARTERY BYPASS GRAFTING (CABG) x 5 USING GREATER SAPHENOUS VEIN;  Surgeon: [name], MD;  Location: [facility];  Service: Open Heart Surgery;   Laterality: N/A;  . ENDOVEIN HARVEST OF GREATER SAPHENOUS VEIN Right 03/28/2016   Procedure: ENDOVEIN HARVEST OF GREATER SAPHENOUS VEIN;  Surgeon: [name], MD;  Location: [facility];  Service: Open Heart Surgery;  Laterality: Right;  . ESOPHAGEAL DILATION N/A 05/07/2015   Procedure: ESOPHAGEAL DILATION WITH 56FR MALONEY DILATOR;  Surgeon: [name], MD;  [facility];  Service: Endoscopy;  Laterality: N/A;  . ESOPHAGOGASTRODUODENOSCOPY (EGD) WITH PROPOFOL N/A 05/07/2015   RMR: Status post dilation of normal esophagus. Gastritis.  Marland Kitchen KNEE SURGERY Left    arthroscopy  . MANDIBLE FRACTURE SURGERY    . POLYPECTOMY  05/07/2015   Procedure: POLYPECTOMY (Hepatic Flexure, Distal Transverse Colon, Rectal);  Surgeon: [name], MD;  [facility];  Service: Endoscopy;;  . TEE WITHOUT CARDIOVERSION N/A 03/28/2016   Procedure: TRANSESOPHAGEAL ECHOCARDIOGRAM (TEE);  Surgeon: [name], MD;  Location: [facility];  Service: Open Heart Surgery;  Laterality: N/A;  . TONSILLECTOMY         Home Medications    Prior to Admission medications   Medication Sig Start Date End Date Taking? Authorizing Provider  albuterol (PROVENTIL HFA;VENTOLIN HFA) 108 (90 Base) MCG/ACT inhaler Inhale 1-2 puffs into the lungs every 6 (six) hours as needed for wheezing or shortness of breath.   Yes [provider]  amLODipine (NORVASC) 10 MG tablet TAKE 1 TABLET BY MOUTH ONCE DAILY. 07/07/17  Yes Herminio Commons, MD  clopidogrel (PLAVIX) 75 MG tablet Take 1 tablet (75 mg total) by mouth daily. 04/18/16  Yes Lendon Colonel, NP  divalproex (DEPAKOTE) 500 MG DR tablet Take 3 tablets (1,500 mg total) by mouth 2 (two) times daily. 01/04/17  Yes Penumalli, Earlean Polka, MD  Doxepin HCl 5 % CREA Apply 2 g topically 3 (three) times daily. Pt applies to chest and legs.   Yes [provider]  Insulin Glargine (LANTUS SOLOSTAR) 100 UNIT/ML Solostar Pen Inject 30 Units into the skin at bedtime. Patient  taking differently: Inject 30 Units into the skin at bedtime as needed (for high blood sugars if over 200).  03/03/17  Yes Cassandria Anger, MD  isosorbide mononitrate (IMDUR) 60 MG 24 hr tablet Take 90 mg by mouth daily.   Yes [provider]  levETIRAcetam (KEPPRA) 1000 MG tablet Take 1 tablet (1,000 mg total) by mouth 2 (two) times daily. 01/04/17  Yes Penumalli, Vikram R, MD  lidocaine (XYLOCAINE) 5 % ointment Apply 2-3 application topically 4 (four) times daily. Pt applies to chest and legs.   Yes [provider]  lisinopril (PRINIVIL,ZESTRIL) 5 MG tablet Take 1 tablet (5 mg total) by mouth daily. 08/31/17 11/29/17 Yes Herminio Commons, MD  metoprolol tartrate (LOPRESSOR) 25 MG tablet Take 0.5 tablets (12.5 mg total) by mouth 2 (two) times daily. 08/31/17  Yes Herminio Commons, MD  naproxen (NAPROSYN) 500 MG tablet Take 1 tablet (500 mg total) by mouth 2 (two) times daily.  08/17/17  Yes Nanavati, Ankit, MD  NONFORMULARY OR COMPOUNDED ITEM APPLY 1 GRAM (1 PUMP) TO THE AFFECTED AREA(S) UP TO 4 TIMES A DAY                        DRUG NAME: B5 D3 K10 Pent+ 03/08/17  Yes [provider]  RANEXA 500 MG 12 hr tablet TAKE (1) TABLET BY MOUTH TWICE DAILY. 05/31/17  Yes Herminio Commons, MD  ACCU-CHEK AVIVA PLUS test strip USE AS DIRECTED TO TEST 4 TIMES DAILY. 07/28/17   Cassandria Anger, MD  benzonatate (TESSALON) 100 MG capsule Take 1 capsule (100 mg total) by mouth every 8 (eight) hours as needed for cough. Patient not taking: Reported on 08/17/2017 08/03/17   Orpah Greek, MD  diazepam (VALIUM) 5 MG tablet Take 5 mg by mouth every 6 (six) hours as needed for anxiety.    [provider]  oxyCODONE-acetaminophen (PERCOCET/ROXICET) 5-325 MG tablet Take 1 tablet by mouth every 4 (four) hours as needed. Patient not taking: Reported on 08/17/2017 05/30/17   Kem Parkinson, PA-C    Family History Family History  Problem Relation Age of Onset  .  Diabetes Father   . Hypertension Father   . Arthritis Unknown   . Diabetes Unknown   . Asthma Unknown   . Stroke Sister     Social History Social History   Tobacco Use  . Smoking status: Current Every Day Smoker    Packs/day: 0.25    Years: 30.00    Pack years: 7.50    Types: Cigarettes  . Smokeless tobacco: Never Used  . Tobacco comment: 3 cigs per day  Substance Use Topics  . Alcohol use: No  . Drug use: No     Allergies   Aspirin   Review of Systems Review of Systems  Constitutional: Negative for chills and fever.  HENT: Negative for congestion and sore throat.   Respiratory: Negative for cough, chest tightness and shortness of breath.   Cardiovascular: Positive for chest pain and leg swelling (Swelling of both feet).  Gastrointestinal: Negative for abdominal pain, nausea and vomiting.  Genitourinary: Negative for dysuria and flank pain.  Musculoskeletal: Positive for neck pain. Negative for arthralgias.  Neurological: Negative for dizziness, syncope, speech difficulty, weakness, numbness and headaches.  Psychiatric/Behavioral: Negative for confusion.     Physical Exam Updated Vital Signs BP 134/80   Pulse (!) 42   Temp (!) 97.4 F (36.3 C) (Oral)   Resp 10   Ht 5\' 7"  (1.702 m)   Wt 74.4 kg (164 lb)   SpO2 100%   BMI 25.69 kg/m   Physical Exam  Constitutional: He is oriented to person, place, and time. He appears well-developed and well-nourished. No distress.  HENT:  Head: Atraumatic.  Mouth/Throat: Oropharynx is clear and moist.  Eyes: EOM are normal. Pupils are equal, round, and reactive to light.  Neck: No JVD present.  Cardiovascular: Normal rate, regular rhythm, normal heart sounds and intact distal pulses.  No murmur heard. No pitting edema of the lower extremities  Pulmonary/Chest: Effort normal and breath sounds normal. No respiratory distress. He has no rales. He exhibits tenderness.  Focal tenderness to palpation of the sternum over the  area of previous CABG, no edema erythema or excessive warmth   Abdominal: Soft. He exhibits no distension. There is no tenderness.  Musculoskeletal:       Cervical back: He exhibits tenderness. He exhibits normal range of  motion and no bony tenderness.  Tenderness to palpation of the lower cervical spine and bilateral paraspinal muscles.  No edema, no bony step-offs.  5 out of 5 strength of the bilateral upper extremities.  No appreciable edema of the feet or lower extremities.  Neurological: He is alert and oriented to person, place, and time. He has normal strength. No sensory deficit. GCS eye subscore is 4. GCS verbal subscore is 5. GCS motor subscore is 6.  Skin: Skin is warm. Capillary refill takes less than 2 seconds. No erythema.  No erythema of the bilateral lower extremities, no open wounds.  Psychiatric: He has a normal mood and affect.  Nursing note and vitals reviewed.    ED Treatments / Results  Labs (all labs ordered are listed, but only abnormal results are displayed) Labs Reviewed  BASIC METABOLIC PANEL - Abnormal; Notable for the following components:      Result Value   Glucose, Bld 108 (*)    BUN 30 (*)    Creatinine, Ser 1.83 (*)    Calcium 8.6 (*)    GFR calc non Af Amer 40 (*)    GFR calc Af Amer 47 (*)    All other components within normal limits  CBC WITH DIFFERENTIAL/PLATELET - Abnormal; Notable for the following components:   RBC 3.53 (*)    Hemoglobin 10.8 (*)    HCT 32.6 (*)    Platelets 148 (*)    Neutro Abs 1.5 (*)    All other components within normal limits  TROPONIN I    EKG  EKG Interpretation  Date/Time:  Monday September 11 2017 09:18:48 EST Ventricular Rate:  42 PR Interval:    QRS Duration: 88 QT Interval:  472 QTC Calculation: 395 R Axis:   -40 Text Interpretation:  Sinus bradycardia Probable left atrial enlargement Left axis deviation Low voltage, precordial leads Anteroseptal infarct, old When compared with ECG of 08/17/2017 No  significant change was found Confirmed by Francine Graven [phone]) on 09/11/2017 11:59:56 AM       Radiology Dg Chest 2 View  Result Date: 09/11/2017 CLINICAL DATA:  Midsternal and bilateral neck pain. Peripheral edema, shortness of breath. History of coronary artery disease and CABG. Current smoker. EXAM: CHEST  2 VIEW COMPARISON:  PA and lateral chest x-ray of August 17, 2017 FINDINGS: The lungs are adequately inflated. There is no alveolar infiltrate. There is no pleural effusion. The heart is mildly enlarged. The pulmonary vascularity is normal. There is calcification in the wall of the aortic arch. The sternal wires are intact. The bony thorax exhibits no acute abnormality. IMPRESSION: There is no pneumonia nor pulmonary edema. Mild cardiomegaly slightly more conspicuous today. Previous CABG. Thoracic aortic atherosclerosis. Electronically Signed   By: David  Martinique M.D.   On: 09/11/2017 13:26    Procedures Procedures (including critical care time)  Medications Ordered in ED Medications - No data to display   Initial Impression / Assessment and Plan / ED Course  I have reviewed the triage vital signs and the nursing notes.  Pertinent labs & imaging results that were available during my care of the patient were reviewed by me and considered in my medical decision making (see chart for details).     Patient reviewed on narcotics database.  diazepam filled on 08/15/2017 no recent opioids.  Patient with likely acute on chronic neck pain.  No red flags on exam no focal neuro deficits.  There is no appreciable edema of the lower extremities, doubtful CHF.  Workup reassuring.  Patient appears safe for discharge home discussed follow-up with PCP and possible referral to pain management.  Final Clinical Impressions(s) / ED Diagnoses   Final diagnoses:  Chronic chest pain  Chronic neck pain    ED Discharge Orders    None       Kem Parkinson, PA-C 09/12/17 1742    Francine Graven, DO 09/13/17 0730

## 2017-09-11 NOTE — ED Triage Notes (Signed)
C/o feet swelling and pain since this morning.  C/o neck and chest pian that started this morning.

## 2017-09-11 NOTE — ED Triage Notes (Signed)
Pt transported by EMS for c/o bilateral foot swelling and pain. Also c/o neck pain.

## 2017-09-11 NOTE — Discharge Instructions (Signed)
Try alternating ice and heat to your neck.  Call Dr. Josephine Cables  office to arrange a follow-up appointment.

## 2017-09-12 ENCOUNTER — Other Ambulatory Visit (HOSPITAL_COMMUNITY): Payer: Self-pay | Admitting: Internal Medicine

## 2017-09-12 DIAGNOSIS — N183 Chronic kidney disease, stage 3 unspecified: Secondary | ICD-10-CM

## 2017-09-14 ENCOUNTER — Ambulatory Visit (HOSPITAL_COMMUNITY)
Admission: RE | Admit: 2017-09-14 | Discharge: 2017-09-14 | Disposition: A | Discharge status: Home / Self Care | Payer: Medicaid Other | Source: Ambulatory Visit | Attending: Internal Medicine | Admitting: Internal Medicine

## 2017-09-14 DIAGNOSIS — N183 Chronic kidney disease, stage 3 unspecified: Secondary | ICD-10-CM

## 2017-10-05 ENCOUNTER — Encounter (HOSPITAL_COMMUNITY): Payer: Self-pay | Admitting: Emergency Medicine

## 2017-10-05 ENCOUNTER — Emergency Department (HOSPITAL_COMMUNITY)
Admission: EM | Admit: 2017-10-05 | Discharge: 2017-10-05 | Disposition: A | Discharge status: Home / Self Care | Payer: Medicaid Other | Attending: Emergency Medicine | Admitting: Emergency Medicine

## 2017-10-05 ENCOUNTER — Emergency Department (HOSPITAL_COMMUNITY): Payer: Medicaid Other

## 2017-10-05 ENCOUNTER — Other Ambulatory Visit: Payer: Self-pay

## 2017-10-05 DIAGNOSIS — I2510 Atherosclerotic heart disease of native coronary artery without angina pectoris: Secondary | ICD-10-CM | POA: Diagnosis not present

## 2017-10-05 DIAGNOSIS — Z951 Presence of aortocoronary bypass graft: Secondary | ICD-10-CM | POA: Diagnosis not present

## 2017-10-05 DIAGNOSIS — E119 Type 2 diabetes mellitus without complications: Secondary | ICD-10-CM | POA: Insufficient documentation

## 2017-10-05 DIAGNOSIS — Z794 Long term (current) use of insulin: Secondary | ICD-10-CM | POA: Diagnosis not present

## 2017-10-05 DIAGNOSIS — F419 Anxiety disorder, unspecified: Secondary | ICD-10-CM | POA: Diagnosis not present

## 2017-10-05 DIAGNOSIS — I10 Essential (primary) hypertension: Secondary | ICD-10-CM | POA: Diagnosis not present

## 2017-10-05 DIAGNOSIS — R0789 Other chest pain: Secondary | ICD-10-CM | POA: Diagnosis not present

## 2017-10-05 DIAGNOSIS — F319 Bipolar disorder, unspecified: Secondary | ICD-10-CM | POA: Diagnosis not present

## 2017-10-05 DIAGNOSIS — R079 Chest pain, unspecified: Secondary | ICD-10-CM | POA: Diagnosis present

## 2017-10-05 DIAGNOSIS — I252 Old myocardial infarction: Secondary | ICD-10-CM | POA: Insufficient documentation

## 2017-10-05 DIAGNOSIS — F209 Schizophrenia, unspecified: Secondary | ICD-10-CM | POA: Diagnosis not present

## 2017-10-05 DIAGNOSIS — Z7902 Long term (current) use of antithrombotics/antiplatelets: Secondary | ICD-10-CM | POA: Diagnosis not present

## 2017-10-05 DIAGNOSIS — Z79899 Other long term (current) drug therapy: Secondary | ICD-10-CM | POA: Insufficient documentation

## 2017-10-05 DIAGNOSIS — F17210 Nicotine dependence, cigarettes, uncomplicated: Secondary | ICD-10-CM | POA: Diagnosis not present

## 2017-10-05 LAB — BASIC METABOLIC PANEL
Anion gap: 8 (ref 5–15)
BUN: 21 mg/dL — AB (ref 6–20)
CALCIUM: 8.8 mg/dL — AB (ref 8.9–10.3)
CO2: 27 mmol/L (ref 22–32)
CREATININE: 1.24 mg/dL (ref 0.61–1.24)
Chloride: 104 mmol/L (ref 101–111)
Glucose, Bld: 129 mg/dL — ABNORMAL HIGH (ref 65–99)
Potassium: 4.6 mmol/L (ref 3.5–5.1)
SODIUM: 139 mmol/L (ref 135–145)

## 2017-10-05 LAB — CBC
HCT: 38.4 % — ABNORMAL LOW (ref 39.0–52.0)
Hemoglobin: 13.2 g/dL (ref 13.0–17.0)
MCH: 31.2 pg (ref 26.0–34.0)
MCHC: 34.4 g/dL (ref 30.0–36.0)
MCV: 90.8 fL (ref 78.0–100.0)
PLATELETS: 172 10*3/uL (ref 150–400)
RBC: 4.23 MIL/uL (ref 4.22–5.81)
RDW: 13.4 % (ref 11.5–15.5)
WBC: 4.2 10*3/uL (ref 4.0–10.5)

## 2017-10-05 LAB — TROPONIN I

## 2017-10-05 MED ORDER — OXYCODONE-ACETAMINOPHEN 5-325 MG PO TABS: 1.0000 | ORAL_TABLET | ORAL | 0 refills | Status: DC | PRN

## 2017-10-05 NOTE — Discharge Instructions (Signed)
As discussed, your evaluation today has been largely reassuring.  But, it is important that you monitor your condition carefully, and do not hesitate to return to the ED if you develop new, or concerning changes in your condition. ? ?Otherwise, please follow-up with your physician for appropriate ongoing care. ? ?

## 2017-10-05 NOTE — ED Provider Notes (Signed)
[facility] EMERGENCY DEPARTMENT Provider Note   CSN: [id number] Arrival date & time: 10/05/17  1055     History   Chief Complaint Chief Complaint  Patient presents with  . Chest Pain    HPI Melvin Taylor is a 52 y.o. male.  HPI  Patient presents with concern of chest pain. The pain began today, about 6 hours ago, not long after the patient awoke. Since onset pain is been essentially unchanged, is left-sided, nonradiating, though there is associated pain in the neck, left arm. Patient notes that this pain he has expressed multiple times since CABG 1 year ago. He does have history of multiple other medical issues including neck surgery, chronic neck pain. He denies new dyspnea, acknowledges new fatigue, denies new fever. No medication taken for pain relief. Patient notes that he has had pain in the past with oxycodone, no relief with Tylenol, ibuprofen, and he cannot take aspirin. Is also had no relief with prednisone after prior episodes.   Past Medical History:  Diagnosis Date  . Anxiety   . Bipolar 1 disorder ([place])   . Cervical radiculopathy   . Chest pain   . Chronic back pain   . Chronic chest wall pain   . Chronic neck pain   . Coronary artery disease   . Depression   . Diabetes mellitus    Type II  . Hallucinations    "long history of them"  . Headache(784.0)   . History of gout   . HTN (hypertension)   . Insomnia   . Neuropathy   . Pain management   . Right leg pain    chronic  . Schizophrenia ([place])   . Seizures ([place])    last sz between July 5-9th, 2016; epilepsy  . Shortness of breath dyspnea    with exertion  . Sleep apnea   . Transfusion of blood product refused for religious reason     Patient Active Problem List   Diagnosis Date Noted  . CAD (coronary artery disease) 03/28/2016  . Chest pain 03/21/2016  . Hyperlipidemia 03/11/2016  . Ischemic chest pain   . NSTEMI (non-ST elevated myocardial infarction) ([place])   . Coronary  atherosclerosis of native coronary artery 03/03/2016  . Cervical spinal stenosis 12/14/2015  . Loss of weight 08/25/2015  . Essential hypertension, benign 07/23/2015  . Personal history of noncompliance with medical treatment, presenting hazards to health 07/23/2015  . Abdominal pain 06/08/2015  . Constipation 06/08/2015  . History of colonic polyps   . Diverticulosis of colon without hemorrhage   . Mucosal abnormality of stomach   . Encounter for screening colonoscopy 04/16/2015  . Dysphagia 04/16/2015  . Partial symptomatic epilepsy with complex partial seizures, intractable, without status epilepticus ([place]) 11/10/2014  . DM type 2 causing vascular disease ([place]) 06/29/2014  . Musculoskeletal pain 06/29/2014  . Hyperglycemia without ketosis 09/07/2013  . 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 Knee pain 12/28/2010  . Medial meniscus, posterior horn derangement 12/28/2010    Past Surgical History:  Procedure Laterality Date  . ANTERIOR CERVICAL DECOMP/DISCECTOMY FUSION N/A 12/14/2015   Procedure: ANTERIOR CERVICAL DECOMPRESSION/DISCECTOMY FUSION CERVICAL FIVE -SIX;  Surgeon: Earnie Larsson, MD;  Location: [facility];  Service: Neurosurgery;  Laterality: N/A;  . BIOPSY  05/07/2015   Procedure: BIOPSY (Gastric);  Surgeon: [name], MD;  Location: [facility];  Service: Endoscopy;;  . CARDIAC CATHETERIZATION N/A 03/07/2016   Procedure: Left Heart Cath and Coronary Angiography;  Surgeon: Lynnell Dike  Tamala Julian, MD;  Location: [facility];  Service: Cardiovascular;  Laterality: N/A;  . COLONOSCOPY WITH PROPOFOL N/A 05/07/2015   PYK:[id number] diverticulosis, multiple colon polyps removed, tubular adenoma, serrated colon polyp. Next colonoscopy October 2019  . CORONARY ARTERY BYPASS GRAFT N/A 03/28/2016   Procedure: CORONARY ARTERY BYPASS GRAFTING (CABG) x 5 USING GREATER SAPHENOUS VEIN;  Surgeon: [name], MD;  Location: [facility];  Service: Open Heart Surgery;  Laterality: N/A;  .  ENDOVEIN HARVEST OF GREATER SAPHENOUS VEIN Right 03/28/2016   Procedure: ENDOVEIN HARVEST OF GREATER SAPHENOUS VEIN;  Surgeon: [name], MD;  Location: [facility];  Service: Open Heart Surgery;  Laterality: Right;  . ESOPHAGEAL DILATION N/A 05/07/2015   Procedure: ESOPHAGEAL DILATION WITH 56FR MALONEY DILATOR;  Surgeon: [name], MD;  Location: [facility];  Service: Endoscopy;  Laterality: N/A;  . ESOPHAGOGASTRODUODENOSCOPY (EGD) WITH PROPOFOL N/A 05/07/2015   RMR: Status post dilation of normal esophagus. Gastritis.  Marland Kitchen KNEE SURGERY Left    arthroscopy  . MANDIBLE FRACTURE SURGERY    . POLYPECTOMY  05/07/2015   Procedure: POLYPECTOMY (Hepatic Flexure, Distal Transverse Colon, Rectal);  Surgeon: [name], MD;  Location: [facility];  Service: Endoscopy;;  . TEE WITHOUT CARDIOVERSION N/A 03/28/2016   Procedure: TRANSESOPHAGEAL ECHOCARDIOGRAM (TEE);  Surgeon: [name], MD;  Location: [facility];  Service: Open Heart Surgery;  Laterality: N/A;  . TONSILLECTOMY         Home Medications    Prior to Admission medications   Medication Sig Start Date End Date Taking? Authorizing Provider  ACCU-CHEK AVIVA PLUS test strip USE AS DIRECTED TO TEST 4 TIMES DAILY. 07/28/17   Cassandria Anger, MD  albuterol (PROVENTIL HFA;VENTOLIN HFA) 108 (90 Base) MCG/ACT inhaler Inhale 1-2 puffs into the lungs every 6 (six) hours as needed for wheezing or shortness of breath.    [provider]  amLODipine (NORVASC) 10 MG tablet TAKE 1 TABLET BY MOUTH ONCE DAILY. 07/07/17   Herminio Commons, MD  benzonatate (TESSALON) 100 MG capsule Take 1 capsule (100 mg total) by mouth every 8 (eight) hours as needed for cough. Patient not taking: Reported on 08/17/2017 08/03/17   Orpah Greek, MD  clopidogrel (PLAVIX) 75 MG tablet Take 1 tablet (75 mg total) by mouth daily. 04/18/16   Lendon Colonel, NP  cyclobenzaprine (FLEXERIL) 10 MG tablet Take 1 tablet (10 mg total) by mouth 3 (three) times daily  as needed. 09/11/17   Triplett, Tammy, PA-C  diazepam (VALIUM) 5 MG tablet Take 5 mg by mouth every 6 (six) hours as needed for anxiety.    [provider]  divalproex (DEPAKOTE) 500 MG DR tablet Take 3 tablets (1,500 mg total) by mouth 2 (two) times daily. 01/04/17   Penumalli, Earlean Polka, MD  Doxepin HCl 5 % CREA Apply 2 g topically 3 (three) times daily. Pt applies to chest and legs.    [provider]  Insulin Glargine (LANTUS SOLOSTAR) 100 UNIT/ML Solostar Pen Inject 30 Units into the skin at bedtime. Patient taking differently: Inject 30 Units into the skin at bedtime as needed (for high blood sugars if over 200).  03/03/17   Cassandria Anger, MD  isosorbide mononitrate (IMDUR) 60 MG 24 hr tablet Take 90 mg by mouth daily.    [provider]  levETIRAcetam (KEPPRA) 1000 MG tablet Take 1 tablet (1,000 mg total) by mouth 2 (two) times daily. 01/04/17   Penumalli, Earlean Polka, MD  lidocaine (  XYLOCAINE) 5 % ointment Apply 2-3 application topically 4 (four) times daily. Pt applies to chest and legs.    [provider]  lisinopril (PRINIVIL,ZESTRIL) 5 MG tablet Take 1 tablet (5 mg total) by mouth daily. 08/31/17 11/29/17  Herminio Commons, MD  metoprolol tartrate (LOPRESSOR) 25 MG tablet Take 0.5 tablets (12.5 mg total) by mouth 2 (two) times daily. 08/31/17   Herminio Commons, MD  naproxen (NAPROSYN) 500 MG tablet Take 1 tablet (500 mg total) by mouth 2 (two) times daily. 08/17/17   Varney Biles, MD  NONFORMULARY OR COMPOUNDED ITEM APPLY 1 GRAM (1 PUMP) TO THE AFFECTED AREA(S) UP TO 4 TIMES A DAY                        DRUG NAME: B5 D3 K10 Pent+ 03/08/17   [provider]  oxyCODONE-acetaminophen (PERCOCET/ROXICET) 5-325 MG tablet Take 1 tablet by mouth every 4 (four) hours as needed. 10/05/17   Carmin Muskrat, MD  RANEXA 500 MG 12 hr tablet TAKE (1) TABLET BY MOUTH TWICE DAILY. 05/31/17   Herminio Commons, MD    Family History Family History  Problem  Relation Age of Onset  . Diabetes Father   . Hypertension Father   . Arthritis Unknown   . Diabetes Unknown   . Asthma Unknown   . Stroke Sister     Social History Social History   Tobacco Use  . Smoking status: Current Every Day Smoker    Packs/day: 0.25    Years: 30.00    Pack years: 7.50    Types: Cigarettes  . Smokeless tobacco: Never Used  . Tobacco comment: 3 cigs per day  Substance Use Topics  . Alcohol use: No  . Drug use: No     Allergies   Aspirin   Review of Systems Review of Systems  Constitutional:       Per HPI, otherwise negative  HENT:       Per HPI, otherwise negative  Respiratory:       Per HPI, otherwise negative  Cardiovascular:       Per HPI, otherwise negative  Gastrointestinal: Negative for vomiting.  Endocrine:       Negative aside from HPI  Genitourinary:       Neg aside from HPI   Musculoskeletal:       Per HPI, otherwise negative  Skin: Negative.   Neurological: Negative for syncope.     Physical Exam Updated Vital Signs BP (!) 194/117 (BP Location: Right Arm)   Pulse (!) 56   Temp 98.6 F (37 C) (Oral)   Resp 18   Ht 5\' 7"  (1.702 m)   Wt 74.4 kg (164 lb)   SpO2 100%   BMI 25.69 kg/m   Physical Exam  Constitutional: He is oriented to person, place, and time. He appears well-developed. No distress.  HENT:  Head: Normocephalic and atraumatic.  Eyes: Conjunctivae and EOM are normal.  Cardiovascular: Normal rate and regular rhythm.  Pulmonary/Chest: Effort normal. No stridor. No respiratory distress.  Abdominal: He exhibits no distension.  Musculoskeletal: He exhibits no edema.  Neurological: He is alert and oriented to person, place, and time.  Skin: Skin is warm and dry.  Psychiatric: He has a normal mood and affect.  Nursing note and vitals reviewed.    ED Treatments / Results  Labs (all labs ordered are listed, but only abnormal results are displayed) Labs Reviewed  BASIC METABOLIC PANEL -  Abnormal;  Notable for the following components:      Result Value   Glucose, Bld 129 (*)    BUN 21 (*)    Calcium 8.8 (*)    All other components within normal limits  CBC - Abnormal; Notable for the following components:   HCT 38.4 (*)    All other components within normal limits  TROPONIN I    EKG  EKG Interpretation  Date/Time:  Thursday October 05 2017 11:11:11 EST Ventricular Rate:  56 PR Interval:  176 QRS Duration: 82 QT Interval:  430 QTC Calculation: 414 R Axis:   -58 Text Interpretation:  Sinus bradycardia Left axis deviation Anterior injury pattern T wave abnormality No significant change since last tracing Abnormal ekg Confirmed by Carmin Muskrat [phone]) on 10/05/2017 3:05:03 PM       Radiology Dg Chest 2 View  Result Date: 10/05/2017 CLINICAL DATA:  Chest pain. EXAM: CHEST - 2 VIEW COMPARISON:  Radiographs of September 11, 2017. FINDINGS: The heart size and mediastinal contours are within normal limits. Both lungs are clear. No pneumothorax or pleural effusion is noted. Status post coronary artery bypass graft. The visualized skeletal structures are unremarkable. IMPRESSION: No active cardiopulmonary disease. Electronically Signed   By: Marijo Conception, M.D.   On: 10/05/2017 11:49    Procedures Procedures (including critical care time)  Medications Ordered in ED Medications - No data to display   Initial Impression / Assessment and Plan / ED Course  I have reviewed the triage vital signs and the nursing notes.  Pertinent labs & imaging results that were available during my care of the patient were reviewed by me and considered in my medical decision making (see chart for details).  This patient presented with chest pain.  The evaluation here has been reassuring, with no evidence of ongoing coronary ischemia, infection or other acute new pathology.  Vitals and labs are consistent with the reassuring physical exam.  The patient is appropriate for further evaluation and  management as an outpatient.  Final Clinical Impressions(s) / ED Diagnoses   Final diagnoses:  Atypical chest pain    ED Discharge Orders        Ordered    oxyCODONE-acetaminophen (PERCOCET/ROXICET) 5-325 MG tablet  Every 4 hours PRN     10/05/17 1518       Carmin Muskrat, MD 10/05/17 1520

## 2017-10-05 NOTE — ED Triage Notes (Signed)
Patient c/o left side chest pain that radiates into neck. Left arm, and back. Per patient shortness of breath and weakness. Patient states started this morning upon getting out of bed. Patient has hx of open heart surgery.

## 2017-10-30 DIAGNOSIS — K9289 Other specified diseases of the digestive system: Secondary | ICD-10-CM | POA: Insufficient documentation

## 2017-10-30 HISTORY — DX: Other specified diseases of the digestive system: K92.89

## 2018-01-17 IMAGING — DX DG CHEST 2V
2 series · 2 of 2 positions shown · non-contrast
Comparison: Radiograph February 23, 2016.

CLINICAL DATA: Hypertension, preop for cardiac catheterization.

EXAM:
CHEST  2 VIEW

[chest pa]
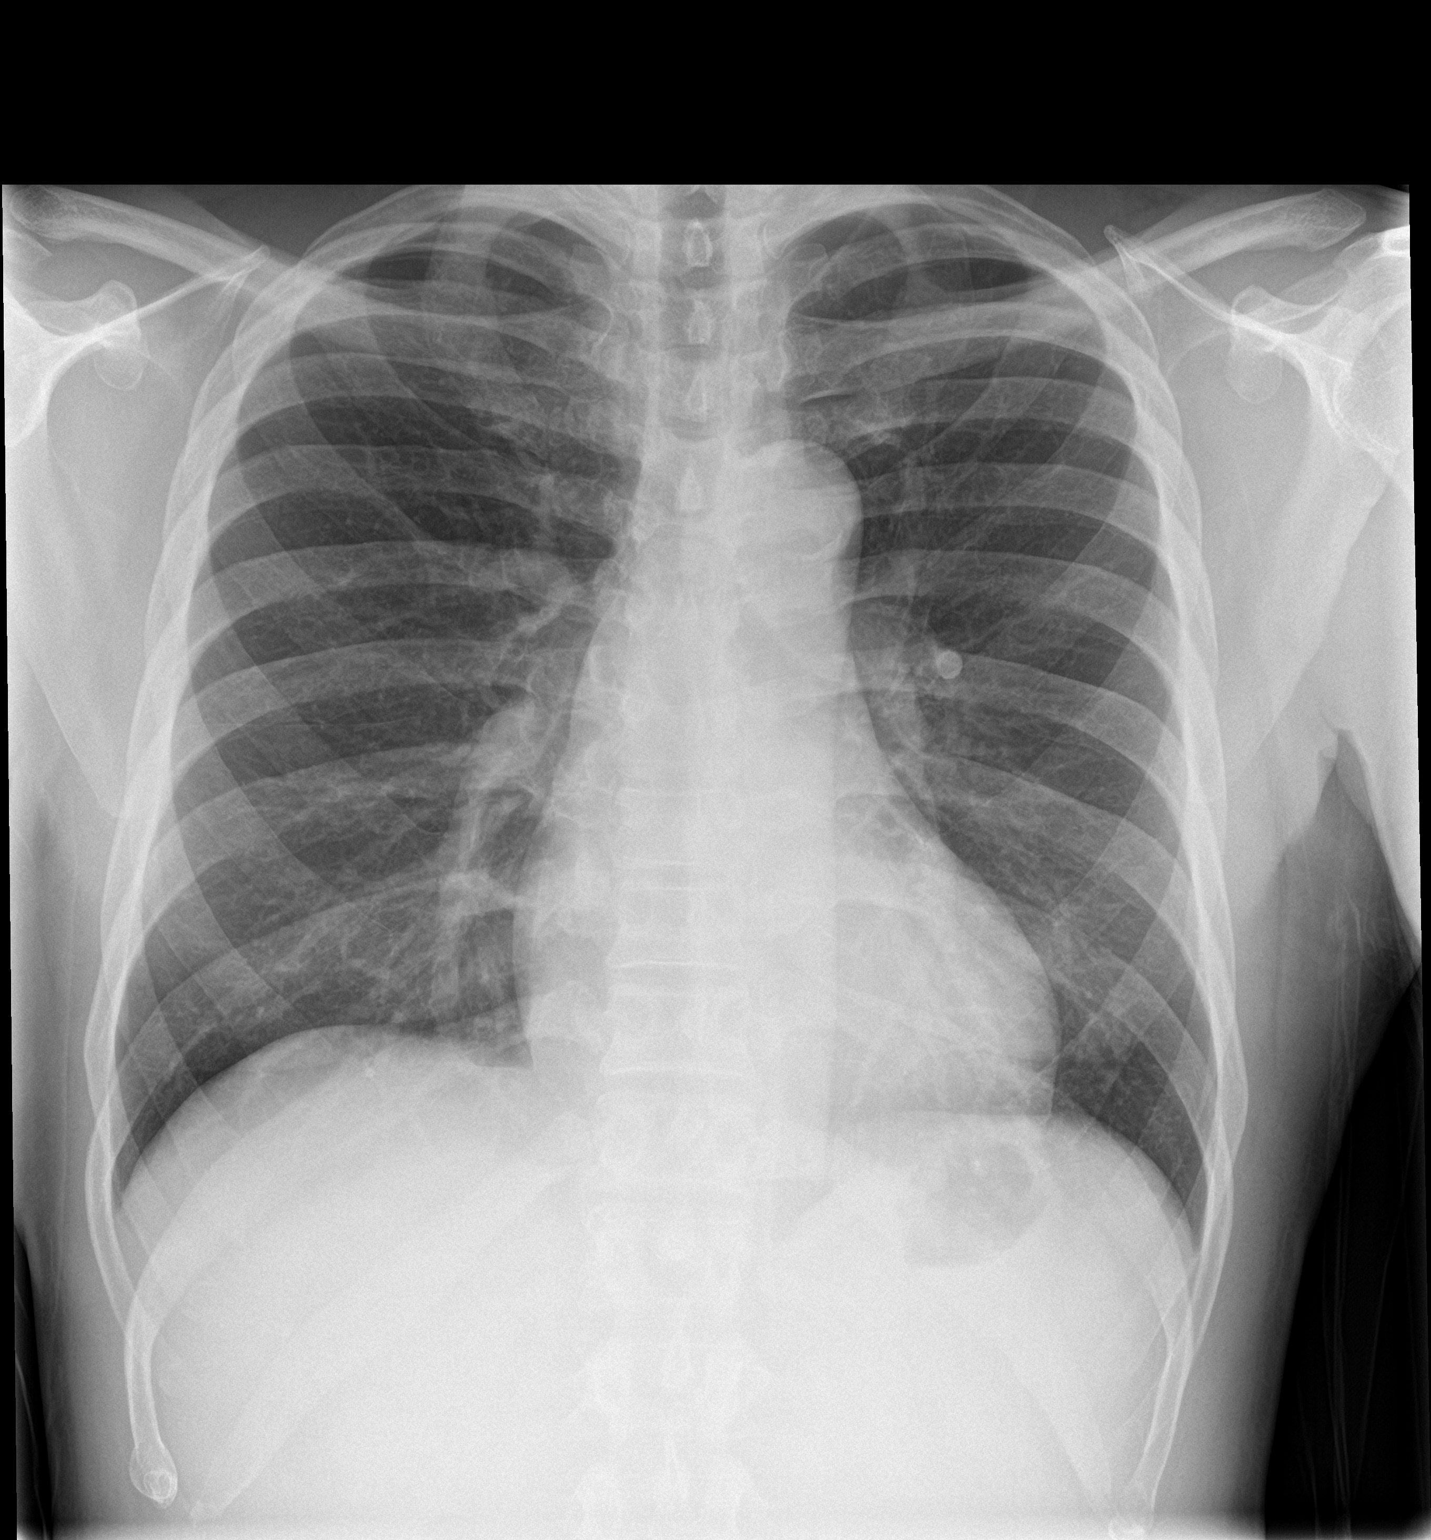

[chest lat]
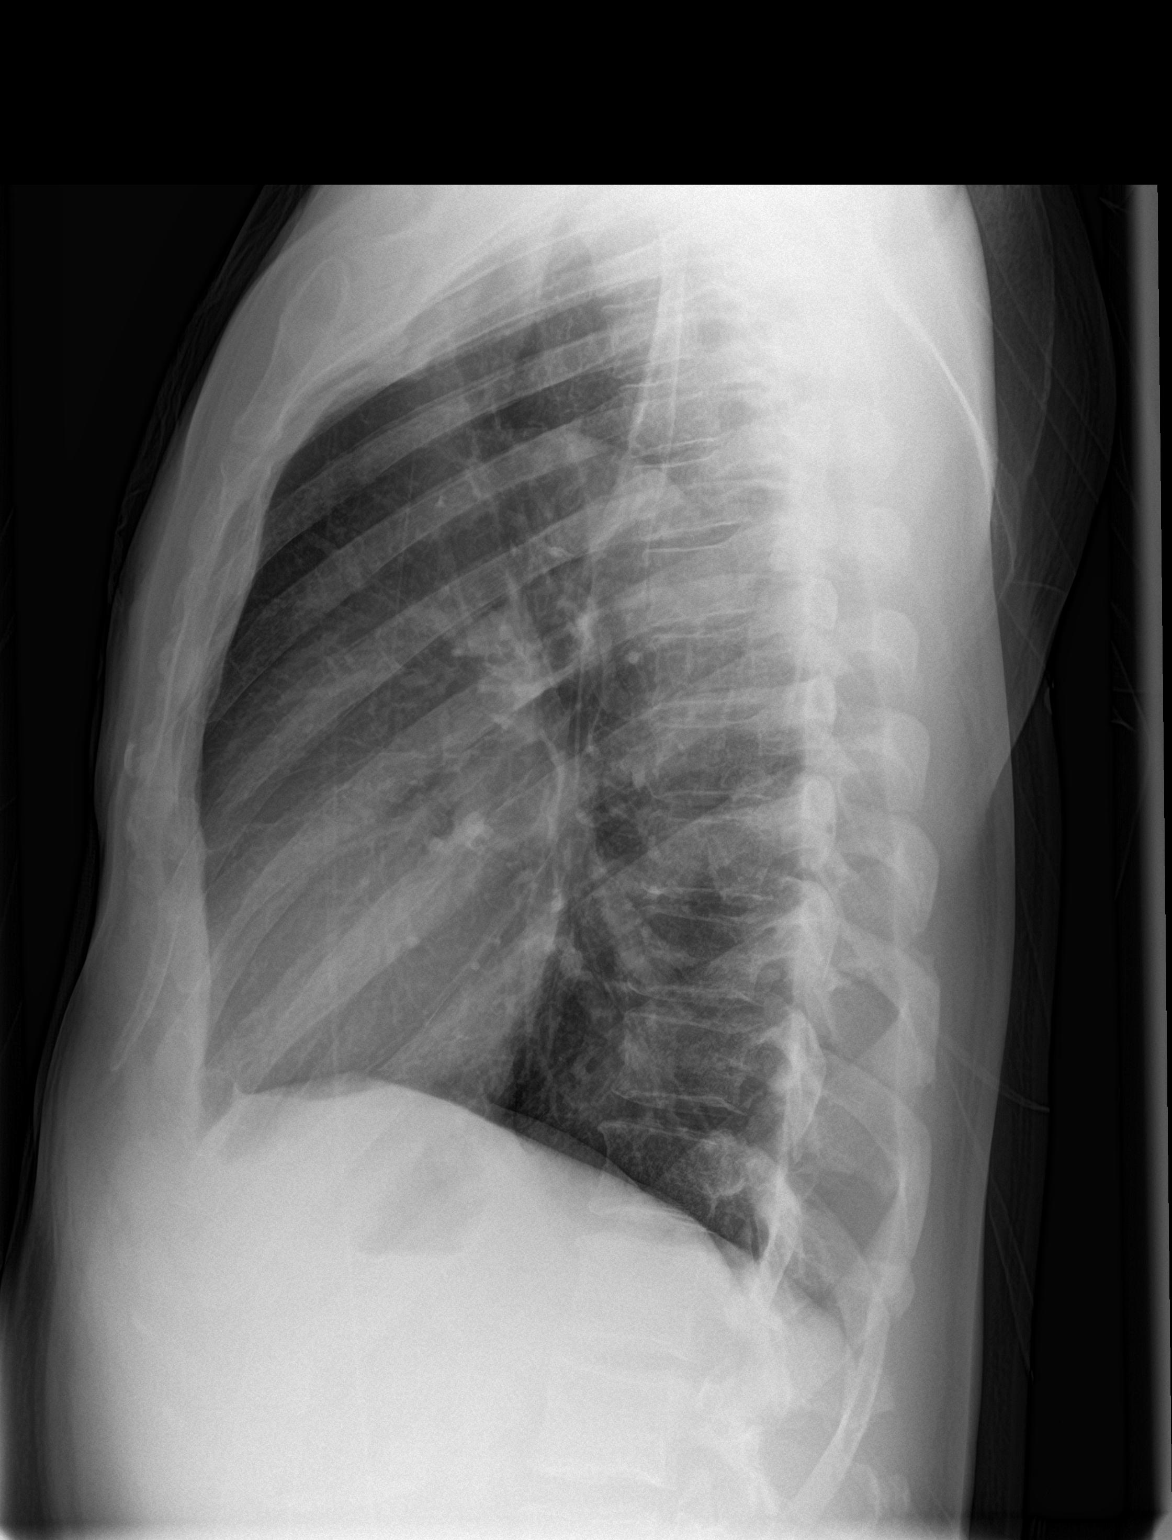

[2 of 2 positions shown; findings below may reference images not displayed]

FINDINGS: The heart size and mediastinal contours are within normal limits.
Both lungs are clear. No pneumothorax or pleural effusion is noted.
The visualized skeletal structures are unremarkable.
IMPRESSION: No active cardiopulmonary disease.

## 2018-01-31 ENCOUNTER — Encounter (HOSPITAL_COMMUNITY): Payer: Self-pay

## 2018-01-31 ENCOUNTER — Emergency Department (HOSPITAL_COMMUNITY)
Admission: EM | Admit: 2018-01-31 | Discharge: 2018-01-31 | Disposition: A | Discharge status: Home / Self Care | Payer: Medicaid Other | Attending: Emergency Medicine | Admitting: Emergency Medicine

## 2018-01-31 ENCOUNTER — Other Ambulatory Visit: Payer: Self-pay

## 2018-01-31 DIAGNOSIS — Z794 Long term (current) use of insulin: Secondary | ICD-10-CM | POA: Insufficient documentation

## 2018-01-31 DIAGNOSIS — I10 Essential (primary) hypertension: Secondary | ICD-10-CM | POA: Insufficient documentation

## 2018-01-31 DIAGNOSIS — Z79899 Other long term (current) drug therapy: Secondary | ICD-10-CM | POA: Insufficient documentation

## 2018-01-31 DIAGNOSIS — E119 Type 2 diabetes mellitus without complications: Secondary | ICD-10-CM | POA: Insufficient documentation

## 2018-01-31 DIAGNOSIS — Z7902 Long term (current) use of antithrombotics/antiplatelets: Secondary | ICD-10-CM | POA: Insufficient documentation

## 2018-01-31 DIAGNOSIS — R569 Unspecified convulsions: Secondary | ICD-10-CM | POA: Diagnosis present

## 2018-01-31 DIAGNOSIS — F17210 Nicotine dependence, cigarettes, uncomplicated: Secondary | ICD-10-CM | POA: Insufficient documentation

## 2018-01-31 LAB — BASIC METABOLIC PANEL
Anion gap: 6 (ref 5–15)
BUN: 18 mg/dL (ref 6–20)
CO2: 29 mmol/L (ref 22–32)
Calcium: 9 mg/dL (ref 8.9–10.3)
Chloride: 106 mmol/L (ref 98–111)
Creatinine, Ser: 1.37 mg/dL — ABNORMAL HIGH (ref 0.61–1.24)
GFR calc Af Amer: >60 mL/min (ref >60)
GFR calc non Af Amer: 57 mL/min — ABNORMAL LOW (ref >60)
Glucose, Bld: 122 mg/dL — ABNORMAL HIGH (ref 70–99)
Potassium: 4.1 mmol/L (ref 3.5–5.1)
Sodium: 141 mmol/L (ref 135–145)

## 2018-01-31 LAB — CBC WITH DIFFERENTIAL/PLATELET
Basophils Absolute: 0 10*3/uL (ref 0.0–0.1)
Basophils Relative: 0 %
Eosinophils Absolute: 0.2 10*3/uL (ref 0.0–0.7)
Eosinophils Relative: 4 %
HCT: 39.3 % (ref 39.0–52.0)
Hemoglobin: 13.7 g/dL (ref 13.0–17.0)
Lymphocytes Relative: 60 %
Lymphs Abs: 3 10*3/uL (ref 0.7–4.0)
MCH: 31.8 pg (ref 26.0–34.0)
MCHC: 34.9 g/dL (ref 30.0–36.0)
MCV: 91.2 fL (ref 78.0–100.0)
Monocytes Absolute: 0.3 10*3/uL (ref 0.1–1.0)
Monocytes Relative: 6 %
Neutro Abs: 1.5 10*3/uL — ABNORMAL LOW (ref 1.7–7.7)
Neutrophils Relative %: 30 %
Platelets: 170 10*3/uL (ref 150–400)
RBC: 4.31 MIL/uL (ref 4.22–5.81)
RDW: 12.4 % (ref 11.5–15.5)
WBC: 5.1 10*3/uL (ref 4.0–10.5)

## 2018-01-31 LAB — RAPID URINE DRUG SCREEN, HOSP PERFORMED
Amphetamines: NEGATIVE — AB
Benzodiazepines: NEGATIVE — AB
Cocaine: POSITIVE — AB
Opiates: NEGATIVE — AB
Tetrahydrocannabinol: POSITIVE — AB

## 2018-01-31 LAB — VALPROIC ACID LEVEL: Valproic Acid Lvl: 99 ug/mL (ref 50.0–100.0)

## 2018-01-31 LAB — ETHANOL: Alcohol, Ethyl (B): <10 mg/dL (ref <10)

## 2018-01-31 LAB — CBG MONITORING, ED: Glucose-Capillary: 100 mg/dL — ABNORMAL HIGH (ref 70–99)

## 2018-01-31 MED ORDER — LEVETIRACETAM 250 MG PO TABS: 250.0000 mg | ORAL_TABLET | Freq: Two times a day (BID) | ORAL | 0 refills | Status: DC

## 2018-01-31 MED ORDER — MAGNESIUM SULFATE IN D5W 1-5 GM/100ML-% IV SOLN
1.0000 g | Freq: Once | INTRAVENOUS | Status: AC
  Administered 2018-01-31: 1 g via INTRAVENOUS
  Filled 2018-01-31: qty 100

## 2018-01-31 MED ORDER — DEXAMETHASONE SODIUM PHOSPHATE 10 MG/ML IJ SOLN
10.0000 mg | Freq: Once | INTRAMUSCULAR | Status: AC
  Administered 2018-01-31: 10 mg via INTRAVENOUS
  Filled 2018-01-31: qty 1

## 2018-01-31 MED ORDER — METOCLOPRAMIDE HCL 5 MG/ML IJ SOLN
10.0000 mg | Freq: Once | INTRAMUSCULAR | Status: AC
  Administered 2018-01-31: 10 mg via INTRAVENOUS
  Filled 2018-01-31: qty 2

## 2018-01-31 MED ORDER — SODIUM CHLORIDE 0.9 % IV BOLUS
1000.0000 mL | Freq: Once | INTRAVENOUS | Status: AC
  Administered 2018-01-31: 1000 mL via INTRAVENOUS

## 2018-01-31 MED ORDER — DIPHENHYDRAMINE HCL 50 MG/ML IJ SOLN
25.0000 mg | Freq: Once | INTRAMUSCULAR | Status: AC
  Administered 2018-01-31: 25 mg via INTRAVENOUS
  Filled 2018-01-31: qty 1

## 2018-01-31 NOTE — ED Triage Notes (Signed)
Pt states having a seizure this morning. Has hx of seizures but 'has not had one in a long time'. States having seizure in bed but waking up in the floor.

## 2018-01-31 NOTE — ED Provider Notes (Signed)
[facility] EMERGENCY DEPARTMENT Provider Note   CSN: [id number] Arrival date & time: 01/31/18  9390     History   Chief Complaint Chief Complaint  Patient presents with  . Seizures    HPI Melvin Taylor is a 52 y.o. male with history of anxiety, bipolar 1 disorder, chronic back pain, chest pain, neck pain, CAD, type 2 diabetes mellitus, hypertension, neuropathy, schizophrenia, and seizure disorder presents for evaluation of possible seizure-like activity.  Patient states that at around 3 AM he awoke on the floor and felt very confused.  He states "I am sure I had a seizure because I had them before and this is what it feels like ".  Seizure was unwitnessed, patient is unsure how long he may have seized.  Denies urinary incontinence or tongue injury.  States he felt confused for quite a while after the possible seizure and states "I still do not feel completely normal".  He endorses throbbing left-sided headache and generalized muscle aches.  States he felt very tired before going to bed last night.  Occasionally smokes marijuana, last smoked 3 days ago.  Denies recent alcohol use or any other recreational drug use.  States he has been compliant with his Depakote.  Denies vision changes, nausea, vomiting, numbness, tingling, or focal weakness.  States he feels weak all over.  The history is provided by the patient.    Past Medical History:  Diagnosis Date  . Anxiety   . Bipolar 1 disorder ([place])   . Cervical radiculopathy   . Chest pain   . Chronic back pain   . Chronic chest wall pain   . Chronic neck pain   . Coronary artery disease   . Depression   . Diabetes mellitus    Type II  . Hallucinations    "long history of them"  . Headache(784.0)   . History of gout   . HTN (hypertension)   . Insomnia   . Neuropathy   . Pain management   . Right leg pain    chronic  . Schizophrenia ([place])   . Seizures ([place])    last sz between July 5-9th, 2016; epilepsy  . Shortness of  breath dyspnea    with exertion  . Sleep apnea   . Transfusion of blood product refused for religious reason     Patient Active Problem List   Diagnosis Date Noted  . CAD (coronary artery disease) 03/28/2016  . Chest pain 03/21/2016  . Hyperlipidemia 03/11/2016  . Ischemic chest pain   . NSTEMI (non-ST elevated myocardial infarction) ([place])   . Coronary atherosclerosis of native coronary artery 03/03/2016  . Cervical spinal stenosis 12/14/2015  . Loss of weight 08/25/2015  . Essential hypertension, benign 07/23/2015  . Personal history of noncompliance with medical treatment, presenting hazards to health 07/23/2015  . Abdominal pain 06/08/2015  . Constipation 06/08/2015  . History of colonic polyps   . Diverticulosis of colon without hemorrhage   . Mucosal abnormality of stomach   . Encounter for screening colonoscopy 04/16/2015  . Dysphagia 04/16/2015  . Partial symptomatic epilepsy with complex partial seizures, intractable, without status epilepticus ([place]) 11/10/2014  . DM type 2 causing vascular disease ([place]) 06/29/2014  . Musculoskeletal pain 06/29/2014  . Hyperglycemia without ketosis 09/07/2013  . 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 Knee pain 12/28/2010  . Medial meniscus, posterior horn derangement 12/28/2010    Past Surgical History:  Procedure Laterality Date  . ANTERIOR CERVICAL DECOMP/DISCECTOMY FUSION N/A  12/14/2015   Procedure: ANTERIOR CERVICAL DECOMPRESSION/DISCECTOMY FUSION CERVICAL FIVE -SIX;  Surgeon: Earnie Larsson, MD;  Location: [place] [facility]UR[facility] ORS;  Service: Neurosurgery;  Laterality: N/A;  . BIOPSY  05/07/2015   Procedure: BIOPSY (Gastric);  Surgeon: [name], MD;  Location: [facility];  Service: Endoscopy;;  . CARDIAC CATHETERIZATION N/A 03/07/2016   Procedure: Left Heart Cath and Coronary Angiography;  Surgeon: Belva Crome, MD;  Location: [place] CV LAB;  Service: Cardiovascular;  Laterality: N/A;  . COLONOSCOPY WITH PROPOFOL N/A 05/07/2015    ENI:DPOEUMPNTI diverticulosis, multiple colon polyps removed, tubular adenoma, serrated colon polyp. Next colonoscopy October 2019  . CORONARY ARTERY BYPASS GRAFT N/A 03/28/2016   Procedure: CORONARY ARTERY BYPASS GRAFTING (CABG) x 5 USING GREATER SAPHENOUS VEIN;  Surgeon: [name], MD;  Location: [place] OR;  Service: Open Heart Surgery;  Laterality: N/A;  . ENDOVEIN HARVEST OF GREATER SAPHENOUS VEIN Right 03/28/2016   Procedure: ENDOVEIN HARVEST OF GREATER SAPHENOUS VEIN;  Surgeon: [name], MD;  Location: [place];  Service: Open Heart Surgery;  Laterality: Right;  . ESOPHAGEAL DILATION N/A 05/07/2015   Procedure: ESOPHAGEAL DILATION WITH 56FR MALONEY DILATOR;  Surgeon: [name], MD;  Location: [facility];  Service: Endoscopy;  Laterality: N/A;  . ESOPHAGOGASTRODUODENOSCOPY (EGD) WITH PROPOFOL N/A 05/07/2015   RMR: Status post dilation of normal esophagus. Gastritis.  Marland Kitchen KNEE SURGERY Left    arthroscopy  . MANDIBLE FRACTURE SURGERY    . POLYPECTOMY  05/07/2015   Procedure: POLYPECTOMY (Hepatic Flexure, Distal Transverse Colon, Rectal);  Surgeon: [name], MD;  Location: [facility];  Service: Endoscopy;;  . TEE WITHOUT CARDIOVERSION N/A 03/28/2016   Procedure: TRANSESOPHAGEAL ECHOCARDIOGRAM (TEE);  Surgeon: [name], MD;  Location: [place];  Service: Open Heart Surgery;  Laterality: N/A;  . TONSILLECTOMY          Home Medications    Prior to Admission medications   Medication Sig Start Date End Date Taking? Authorizing Provider  ACCU-CHEK AVIVA PLUS test strip USE AS DIRECTED TO TEST 4 TIMES DAILY. 07/28/17   Cassandria Anger, MD  albuterol (PROVENTIL HFA;VENTOLIN HFA) 108 (90 Base) MCG/ACT inhaler Inhale 1-2 puffs into the lungs every 6 (six) hours as needed for wheezing or shortness of breath.    [provider]  amLODipine (NORVASC) 10 MG tablet TAKE 1 TABLET BY MOUTH ONCE DAILY. 07/07/17   Herminio Commons, MD  benzonatate (TESSALON) 100 MG capsule Take 1  capsule (100 mg total) by mouth every 8 (eight) hours as needed for cough. Patient not taking: Reported on 08/17/2017 08/03/17   Orpah Greek, MD  clopidogrel (PLAVIX) 75 MG tablet Take 1 tablet (75 mg total) by mouth daily. 04/18/16   Lendon Colonel, NP  cyclobenzaprine (FLEXERIL) 10 MG tablet Take 1 tablet (10 mg total) by mouth 3 (three) times daily as needed. 09/11/17   Triplett, Tammy, PA-C  diazepam (VALIUM) 5 MG tablet Take 5 mg by mouth every 6 (six) hours as needed for anxiety.    [provider]  divalproex (DEPAKOTE) 500 MG DR tablet Take 3 tablets (1,500 mg total) by mouth 2 (two) times daily. 01/04/17   Penumalli, Earlean Polka, MD  Doxepin HCl 5 % CREA Apply 2 g topically 3 (three) times daily. Pt applies to chest and legs.    [provider]  Insulin Glargine (LANTUS SOLOSTAR) 100 UNIT/ML Solostar Pen Inject 30 Units into the skin at bedtime. Patient taking differently: Inject 30 Units  into the skin at bedtime as needed (for high blood sugars if over 200).  03/03/17   Cassandria Anger, MD  isosorbide mononitrate (IMDUR) 60 MG 24 hr tablet Take 90 mg by mouth daily.    [provider]  levETIRAcetam (KEPPRA) 1000 MG tablet Take 1 tablet (1,000 mg total) by mouth 2 (two) times daily. 01/04/17   Penumalli, Earlean Polka, MD  levETIRAcetam (KEPPRA) 250 MG tablet Take 1 tablet (250 mg total) by mouth 2 (two) times daily. 01/31/18   Iyona Pehrson A, PA-C  lidocaine (XYLOCAINE) 5 % ointment Apply 2-3 application topically 4 (four) times daily. Pt applies to chest and legs.    [provider]  lisinopril (PRINIVIL,ZESTRIL) 5 MG tablet Take 1 tablet (5 mg total) by mouth daily. 08/31/17 11/29/17  Herminio Commons, MD  metoprolol tartrate (LOPRESSOR) 25 MG tablet Take 0.5 tablets (12.5 mg total) by mouth 2 (two) times daily. 08/31/17   Herminio Commons, MD  naproxen (NAPROSYN) 500 MG tablet Take 1 tablet (500 mg total) by mouth 2 (two) times daily. 08/17/17    Varney Biles, MD  NONFORMULARY OR COMPOUNDED ITEM APPLY 1 GRAM (1 PUMP) TO THE AFFECTED AREA(S) UP TO 4 TIMES A DAY                        DRUG NAME: B5 D3 K10 Pent+ 03/08/17   [provider]  oxyCODONE-acetaminophen (PERCOCET/ROXICET) 5-325 MG tablet Take 1 tablet by mouth every 4 (four) hours as needed. 10/05/17   Carmin Muskrat, MD  RANEXA 500 MG 12 hr tablet TAKE (1) TABLET BY MOUTH TWICE DAILY. 05/31/17   Herminio Commons, MD    Family History Family History  Problem Relation Age of Onset  . Diabetes Father   . Hypertension Father   . Arthritis Unknown   . Diabetes Unknown   . Asthma Unknown   . Stroke Sister     Social History Social History   Tobacco Use  . Smoking status: Current Every Day Smoker    Packs/day: 0.25    Years: 30.00    Pack years: 7.50    Types: Cigarettes  . Smokeless tobacco: Never Used  . Tobacco comment: 3 cigs per day  Substance Use Topics  . Alcohol use: No  . Drug use: No     Allergies   Aspirin   Review of Systems Review of Systems  Constitutional: Positive for fatigue. Negative for chills and fever.  Eyes: Negative for visual disturbance.  Gastrointestinal: Negative for nausea and vomiting.  Musculoskeletal: Positive for myalgias.  Neurological: Positive for seizures, weakness (generalized) and headaches.  All other systems reviewed and are negative.    Physical Exam Updated Vital Signs BP (!) 153/87   Pulse 69   Temp 98.3 F (36.8 C) (Oral)   Resp 18   Ht 5\' 7"  (1.702 m)   Wt 70.8 kg (156 lb)   SpO2 100%   BMI 24.43 kg/m   Physical Exam  Constitutional: He is oriented to person, place, and time. He appears well-developed and well-nourished. No distress.  HENT:  Head: Normocephalic and atraumatic.  No Battle's signs, no raccoon's eyes, no rhinorrhea. No hemotympanum. No tenderness to palpation of the face or skull. No deformity, crepitus, or swelling noted.  No tongue injury   Eyes: Conjunctivae are  normal. Right eye exhibits no discharge. Left eye exhibits no discharge.  Neck: Normal range of motion. Neck supple. No JVD present. No  tracheal deviation present.  Cardiovascular: Normal rate, regular rhythm, normal heart sounds and intact distal pulses.  Pulmonary/Chest: Effort normal and breath sounds normal. No stridor. No respiratory distress. He has no rales. He exhibits no tenderness.  Abdominal: Soft. Bowel sounds are normal. He exhibits no distension.  Musculoskeletal: Normal range of motion. He exhibits no edema.  No midline spine TTP, no paraspinal muscle tenderness, no deformity, crepitus, or step-off noted.  5/5 strength of BUE and BLE major muscle groups.  Neurological: He is alert and oriented to person, place, and time. No cranial nerve deficit. He exhibits normal muscle tone.  Mental Status:  Alert, thought content appropriate, able to give a coherent history. Speech fluent without evidence of aphasia. Able to follow 2 step commands without difficulty.  Cranial Nerves:  II:  Peripheral visual fields grossly normal, pupils equal, round, reactive to light III,IV, VI: ptosis not present, extra-ocular motions intact bilaterally  V,VII: smile symmetric, facial light touch sensation equal VIII: hearing grossly normal to voice  X: uvula elevates symmetrically  XI: bilateral shoulder shrug symmetric and strong XII: midline tongue extension without fassiculations Motor:  Normal tone. 5/5 strength of BUE and BLE major muscle groups including strong and equal grip strength and dorsiflexion/plantar flexion Sensory: Altered sensation to light touch of the distal right lower extremity past the knee.  Patient states this is chronic and unchanged.  Otherwise sensation to soft touch is normal in the extremities and face. Cerebellar: normal finger-to-nose with bilateral upper extremities Gait: normal gait and balance. Able to walk on toes and heels with ease.  CV: 2+ radial and DP/PT pulses No  pronator drift, Romberg sign absent, no nystagmus   Skin: Skin is warm and dry. No erythema.  Psychiatric: He has a normal mood and affect. His behavior is normal.  Nursing note and vitals reviewed.    ED Treatments / Results  Labs (all labs ordered are listed, but only abnormal results are displayed) Labs Reviewed  BASIC METABOLIC PANEL - Abnormal; Notable for the following components:      Result Value   Glucose, Bld 122 (*)    Creatinine, Ser 1.37 (*)    GFR calc non Af Amer 57 (*)    All other components within normal limits  CBC WITH DIFFERENTIAL/PLATELET - Abnormal; Notable for the following components:   Neutro Abs 1.5 (*)    All other components within normal limits  RAPID URINE DRUG SCREEN, HOSP PERFORMED - Abnormal; Notable for the following components:   Opiates NEGATIVE (*)    Cocaine POSITIVE (*)    Benzodiazepines NEGATIVE (*)    Amphetamines NEGATIVE (*)    Tetrahydrocannabinol POSITIVE (*)    Barbiturates   (*)    Value: Result not available. Reagent lot number recalled by manufacturer.   All other components within normal limits  CBG MONITORING, ED - Abnormal; Notable for the following components:   Glucose-Capillary 100 (*)    All other components within normal limits  VALPROIC ACID LEVEL  ETHANOL    EKG None  Radiology No results found.  Procedures Procedures (including critical care time)  Medications Ordered in ED Medications  magnesium sulfate IVPB 1 g 100 mL (1 g Intravenous New Bag/Given 01/31/18 1323)  sodium chloride 0.9 % bolus 1,000 mL (0 mLs Intravenous Stopped 01/31/18 1324)  metoCLOPramide (REGLAN) injection 10 mg (10 mg Intravenous Given 01/31/18 1151)  dexamethasone (DECADRON) injection 10 mg (10 mg Intravenous Given 01/31/18 1153)  diphenhydrAMINE (BENADRYL) injection 25 mg (25 mg  Intravenous Given 01/31/18 1151)     Initial Impression / Assessment and Plan / ED Course  I have reviewed the triage vital signs and the nursing  notes.  Pertinent labs & imaging results that were available during my care of the patient were reviewed by me and considered in my medical decision making (see chart for details).     Patient presents for evaluation after unwitnessed seizure.  He is afebrile, vital signs are stable.  He is nontoxic in appearance.  He states this is typically how he feels after a seizure and states "I have had seizures for a long time I know how I feel after them ".  No focal neurologic deficits on examination.  He does not appear to be postictal at this time.  He is ambulatory without difficulty.  Lab work reviewed by me shows no leukocytosis, no significant electrolyte abnormalities.  Creatinine is elevated but this appears to be his baseline.  UDS is positive for cocaine and THC.  Ethanol negative.  Valproic acid level is therapeutic today.  Patient was given migraine cocktail with significant improvement in his headache.  He is tolerating p.o. food and fluids in the ED without difficulty.  I doubt CVA, ICH, SAH, or other acute emergent intracranial abnormality.  At this appears to be consistent with his seizure disorder. 1:40 PM Spoke with Dr. Reggy Eye with tele-neurology who recommends increasing Keppra to 1250mg  BID until the patient is able to follow-up with his neurologist.  I spoke with the patient with family present.  I informed him of the recommendations by tele-neurology.  Reiterated seizure precautions to the patient.  Recommend follow-up with neurologist for reevaluation on an outpatient basis.  Discussed strict ED return precautions.  Patient and family verbalized understanding of and agreement with plan and patient is stable for discharge home at this time. Final Clinical Impressions(s) / ED Diagnoses   Final diagnoses:  Seizure-like activity [facility])    ED Discharge Orders        Ordered    levETIRAcetam (KEPPRA) 250 MG tablet  2 times daily     01/31/18 1356       Renita Papa, PA-C 01/31/18  1403    Isla Pence, MD 01/31/18 1614

## 2018-01-31 NOTE — ED Notes (Signed)
Pt cannot provide urine sample at time. Will call when able.

## 2018-01-31 NOTE — Discharge Instructions (Addendum)
Start taking an additional 250 mg of Keppra twice daily in addition to the Keppra and Depakote you already take.  I have given you a prescription for the 250 mg tablets of Keppra.  Follow-up with your neurologist for reevaluation of your symptoms.  Avoid any activities that could be dangerous if you have another seizure including driving, swimming, or handling infants.  Return to emergency department if any concerning signs or symptoms develop such as recurrent seizure, facial droop, weakness, or passing out

## 2018-01-31 NOTE — ED Notes (Signed)
Instructed pt to get an urine sample 

## 2018-03-18 ENCOUNTER — Emergency Department (HOSPITAL_COMMUNITY): Payer: Medicaid Other

## 2018-03-18 ENCOUNTER — Emergency Department (HOSPITAL_COMMUNITY)
Admission: EM | Admit: 2018-03-18 | Discharge: 2018-03-18 | Disposition: A | Discharge status: Home / Self Care | Payer: Medicaid Other | Attending: Emergency Medicine | Admitting: Emergency Medicine

## 2018-03-18 ENCOUNTER — Encounter (HOSPITAL_COMMUNITY): Payer: Self-pay | Admitting: Emergency Medicine

## 2018-03-18 DIAGNOSIS — R519 Headache, unspecified: Secondary | ICD-10-CM

## 2018-03-18 DIAGNOSIS — G8929 Other chronic pain: Secondary | ICD-10-CM

## 2018-03-18 DIAGNOSIS — R0789 Other chest pain: Secondary | ICD-10-CM | POA: Insufficient documentation

## 2018-03-18 DIAGNOSIS — Z79899 Other long term (current) drug therapy: Secondary | ICD-10-CM | POA: Diagnosis not present

## 2018-03-18 DIAGNOSIS — I10 Essential (primary) hypertension: Secondary | ICD-10-CM | POA: Diagnosis not present

## 2018-03-18 DIAGNOSIS — Z951 Presence of aortocoronary bypass graft: Secondary | ICD-10-CM | POA: Diagnosis not present

## 2018-03-18 DIAGNOSIS — G40909 Epilepsy, unspecified, not intractable, without status epilepticus: Secondary | ICD-10-CM | POA: Diagnosis not present

## 2018-03-18 DIAGNOSIS — E1140 Type 2 diabetes mellitus with diabetic neuropathy, unspecified: Secondary | ICD-10-CM | POA: Diagnosis not present

## 2018-03-18 DIAGNOSIS — I2510 Atherosclerotic heart disease of native coronary artery without angina pectoris: Secondary | ICD-10-CM | POA: Insufficient documentation

## 2018-03-18 DIAGNOSIS — F17210 Nicotine dependence, cigarettes, uncomplicated: Secondary | ICD-10-CM | POA: Insufficient documentation

## 2018-03-18 DIAGNOSIS — Z7902 Long term (current) use of antithrombotics/antiplatelets: Secondary | ICD-10-CM | POA: Insufficient documentation

## 2018-03-18 DIAGNOSIS — F1910 Other psychoactive substance abuse, uncomplicated: Secondary | ICD-10-CM

## 2018-03-18 DIAGNOSIS — Z794 Long term (current) use of insulin: Secondary | ICD-10-CM | POA: Insufficient documentation

## 2018-03-18 DIAGNOSIS — R51 Headache: Secondary | ICD-10-CM | POA: Insufficient documentation

## 2018-03-18 DIAGNOSIS — R569 Unspecified convulsions: Secondary | ICD-10-CM | POA: Diagnosis present

## 2018-03-18 HISTORY — DX: Other psychoactive substance abuse, uncomplicated: F19.10

## 2018-03-18 LAB — BASIC METABOLIC PANEL
ANION GAP: 5 (ref 5–15)
BUN: 16 mg/dL (ref 6–20)
CHLORIDE: 105 mmol/L (ref 98–111)
CO2: 28 mmol/L (ref 22–32)
Calcium: 8.8 mg/dL — ABNORMAL LOW (ref 8.9–10.3)
Creatinine, Ser: 1.34 mg/dL — ABNORMAL HIGH (ref 0.61–1.24)
GFR calc Af Amer: >60 mL/min (ref >60)
GFR calc non Af Amer: 59 mL/min — ABNORMAL LOW (ref >60)
GLUCOSE: 152 mg/dL — AB (ref 70–99)
Potassium: 4.7 mmol/L (ref 3.5–5.1)
Sodium: 138 mmol/L (ref 135–145)

## 2018-03-18 LAB — TROPONIN I: Troponin I: <0.03 ng/mL (ref <0.03)

## 2018-03-18 LAB — RAPID URINE DRUG SCREEN, HOSP PERFORMED
AMPHETAMINES: NOT DETECTED
BARBITURATES: NOT DETECTED
BENZODIAZEPINES: POSITIVE — AB
Cocaine: POSITIVE — AB
Opiates: NOT DETECTED
TETRAHYDROCANNABINOL: POSITIVE — AB

## 2018-03-18 LAB — CBC WITH DIFFERENTIAL/PLATELET
BASOS PCT: 0 %
Basophils Absolute: 0 10*3/uL (ref 0.0–0.1)
Eosinophils Absolute: 0.1 10*3/uL (ref 0.0–0.7)
Eosinophils Relative: 3 %
HCT: 35.5 % — ABNORMAL LOW (ref 39.0–52.0)
HEMOGLOBIN: 12 g/dL — AB (ref 13.0–17.0)
LYMPHS ABS: 2.2 10*3/uL (ref 0.7–4.0)
Lymphocytes Relative: 48 %
MCH: 30.8 pg (ref 26.0–34.0)
MCHC: 33.8 g/dL (ref 30.0–36.0)
MCV: 91.3 fL (ref 78.0–100.0)
MONO ABS: 0.5 10*3/uL (ref 0.1–1.0)
MONOS PCT: 10 %
Neutro Abs: 1.8 10*3/uL (ref 1.7–7.7)
Neutrophils Relative %: 39 %
Platelets: 172 10*3/uL (ref 150–400)
RBC: 3.89 MIL/uL — ABNORMAL LOW (ref 4.22–5.81)
RDW: 13.2 % (ref 11.5–15.5)
WBC: 4.7 10*3/uL (ref 4.0–10.5)

## 2018-03-18 LAB — VALPROIC ACID LEVEL: Valproic Acid Lvl: 69 ug/mL (ref 50.0–100.0)

## 2018-03-18 LAB — ETHANOL

## 2018-03-18 MED ORDER — LEVETIRACETAM 500 MG PO TABS
1000.0000 mg | ORAL_TABLET | Freq: Once | ORAL | Status: AC
  Administered 2018-03-18: 1000 mg via ORAL
  Filled 2018-03-18: qty 2

## 2018-03-18 MED ORDER — OXYCODONE-ACETAMINOPHEN 5-325 MG PO TABS
1.0000 | ORAL_TABLET | Freq: Once | ORAL | Status: AC
  Administered 2018-03-18: 1 via ORAL
  Filled 2018-03-18: qty 1

## 2018-03-18 NOTE — ED Provider Notes (Signed)
[facility] EMERGENCY DEPARTMENT Provider Note   CSN: [id number] Arrival date & time: 03/18/18  2993     History   Chief Complaint Chief Complaint  Patient presents with  . Seizures    HPI Melvin Taylor is a 52 y.o. male.  HPI  Pt was seen at 0950. Per pt, c/o sudden onset and resolution of one episode of presumed seizure that occurred sometime overnight/early this morning. Pt states he went to bed last night and woke up on the floor at 0500 this morning. Pt states his right ankle "hurts," and he has an acute flair of his chronic chest wall pain and chronic headache that he notice when he woke up on the floor. Pt states these are known signs that "I had a seizure." Pt states he took his depakote this morning, but has not taken his keppra. LD keppra was yesterday. Pt is requesting pain medications, also stating he "can't take" tylenol, aspirin, motrin. Denies his CP or headache today are changed from his usual chronic pain pattern. Denies headache was sudden or maximal at onset or at any time. The symptoms have been associated with no other complaints. The patient has a significant history of similar symptoms previously, recently being evaluated for this complaint and multiple prior evals for same.     Past Medical History:  Diagnosis Date  . Anxiety   . Bipolar 1 disorder ([place])   . Cervical radiculopathy   . Chest pain   . Chronic back pain   . Chronic chest wall pain   . Chronic neck pain   . Coronary artery disease   . Depression   . Diabetes mellitus    Type II  . Hallucinations    "long history of them"  . Headache(784.0)   . History of gout   . HTN (hypertension)   . Insomnia   . Neuropathy   . Pain management   . Polysubstance abuse ([place])   . Right leg pain    chronic  . Schizophrenia ([place])   . Seizures ([place])    last sz between July 5-9th, 2016; epilepsy  . Shortness of breath dyspnea    with exertion  . Sleep apnea   . Transfusion of blood product  refused for religious reason     Patient Active Problem List   Diagnosis Date Noted  . CAD (coronary artery disease) 03/28/2016  . Chest pain 03/21/2016  . Hyperlipidemia 03/11/2016  . Ischemic chest pain   . NSTEMI (non-ST elevated myocardial infarction) ([place])   . Coronary atherosclerosis of native coronary artery 03/03/2016  . Cervical spinal stenosis 12/14/2015  . Loss of weight 08/25/2015  . Essential hypertension, benign 07/23/2015  . Personal history of noncompliance with medical treatment, presenting hazards to health 07/23/2015  . Abdominal pain 06/08/2015  . Constipation 06/08/2015  . History of colonic polyps   . Diverticulosis of colon without hemorrhage   . Mucosal abnormality of stomach   . Encounter for screening colonoscopy 04/16/2015  . Dysphagia 04/16/2015  . Partial symptomatic epilepsy with complex partial seizures, intractable, without status epilepticus ([place]) 11/10/2014  . DM type 2 causing vascular disease ([place]) 06/29/2014  . Musculoskeletal pain 06/29/2014  . Hyperglycemia without ketosis 09/07/2013  . 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 Knee pain 12/28/2010  . Medial meniscus, posterior horn derangement 12/28/2010    Past Surgical History:  Procedure Laterality Date  . ANTERIOR CERVICAL DECOMP/DISCECTOMY FUSION N/A 12/14/2015   Procedure: ANTERIOR CERVICAL DECOMPRESSION/DISCECTOMY FUSION CERVICAL  FIVE -SIX;  Surgeon: Earnie Larsson, MD;  Location: [place] [facility];  Service: Neurosurgery;  Laterality: N/A;  . BIOPSY  05/07/2015   Procedure: BIOPSY (Gastric);  Surgeon: [name], MD;  Location: [facility];  Service: Endoscopy;;  . CARDIAC CATHETERIZATION N/A 03/07/2016   Procedure: Left Heart Cath and Coronary Angiography;  Surgeon: Belva Crome, MD;  Location: [place] [facility];  Service: Cardiovascular;  Laterality: N/A;  . COLONOSCOPY WITH PROPOFOL N/A 05/07/2015   [id number] diverticulosis, multiple colon polyps removed, tubular adenoma,  serrated colon polyp. Next colonoscopy October 2019  . CORONARY ARTERY BYPASS GRAFT N/A 03/28/2016   Procedure: CORONARY ARTERY BYPASS GRAFTING (CABG) x 5 USING GREATER SAPHENOUS VEIN;  Surgeon: [name], MD;  Location: [place] OR;  Service: Open Heart Surgery;  Laterality: N/A;  . ENDOVEIN HARVEST OF GREATER SAPHENOUS VEIN Right 03/28/2016   Procedure: ENDOVEIN HARVEST OF GREATER SAPHENOUS VEIN;  Surgeon: [name], MD;  Location: [place];  Service: Open Heart Surgery;  Laterality: Right;  . ESOPHAGEAL DILATION N/A 05/07/2015   Procedure: ESOPHAGEAL DILATION WITH 56FR MALONEY DILATOR;  Surgeon: [name], MD;  Location: [facility];  Service: Endoscopy;  Laterality: N/A;  . ESOPHAGOGASTRODUODENOSCOPY (EGD) WITH PROPOFOL N/A 05/07/2015   RMR: Status post dilation of normal esophagus. Gastritis.  Marland Kitchen KNEE SURGERY Left    arthroscopy  . MANDIBLE FRACTURE SURGERY    . POLYPECTOMY  05/07/2015   Procedure: POLYPECTOMY (Hepatic Flexure, Distal Transverse Colon, Rectal);  Surgeon: [name], MD;  Location: [facility];  Service: Endoscopy;;  . TEE WITHOUT CARDIOVERSION N/A 03/28/2016   Procedure: TRANSESOPHAGEAL ECHOCARDIOGRAM (TEE);  Surgeon: [name], MD;  Location: [place];  Service: Open Heart Surgery;  Laterality: N/A;  . TONSILLECTOMY          Home Medications    Prior to Admission medications   Medication Sig Start Date End Date Taking? Authorizing Provider  ACCU-CHEK AVIVA PLUS test strip USE AS DIRECTED TO TEST 4 TIMES DAILY. 07/28/17   Cassandria Anger, MD  albuterol (PROVENTIL HFA;VENTOLIN HFA) 108 (90 Base) MCG/ACT inhaler Inhale 1-2 puffs into the lungs every 6 (six) hours as needed for wheezing or shortness of breath.    [provider]  amLODipine (NORVASC) 10 MG tablet TAKE 1 TABLET BY MOUTH ONCE DAILY. 07/07/17   Herminio Commons, MD  benzonatate (TESSALON) 100 MG capsule Take 1 capsule (100 mg total) by mouth every 8 (eight) hours as needed for cough. Patient  not taking: Reported on 08/17/2017 08/03/17   Orpah Greek, MD  clopidogrel (PLAVIX) 75 MG tablet Take 1 tablet (75 mg total) by mouth daily. 04/18/16   Lendon Colonel, NP  cyclobenzaprine (FLEXERIL) 10 MG tablet Take 1 tablet (10 mg total) by mouth 3 (three) times daily as needed. 09/11/17   Triplett, Tammy, PA-C  diazepam (VALIUM) 5 MG tablet Take 5 mg by mouth every 6 (six) hours as needed for anxiety.    [provider]  divalproex (DEPAKOTE) 500 MG DR tablet Take 3 tablets (1,500 mg total) by mouth 2 (two) times daily. 01/04/17   Penumalli, Earlean Polka, MD  Doxepin HCl 5 % CREA Apply 2 g topically 3 (three) times daily. Pt applies to chest and legs.    [provider]  Insulin Glargine (LANTUS SOLOSTAR) 100 UNIT/ML Solostar Pen Inject 30 Units into the skin at bedtime. Patient taking differently: Inject 30 Units into the skin at bedtime as needed (for high  blood sugars if over 200).  03/03/17   Cassandria Anger, MD  isosorbide mononitrate (IMDUR) 60 MG 24 hr tablet Take 90 mg by mouth daily.    [provider]  levETIRAcetam (KEPPRA) 1000 MG tablet Take 1 tablet (1,000 mg total) by mouth 2 (two) times daily. 01/04/17   Penumalli, Earlean Polka, MD  levETIRAcetam (KEPPRA) 250 MG tablet Take 1 tablet (250 mg total) by mouth 2 (two) times daily. 01/31/18   Fawze, Mina A, PA-C  lidocaine (XYLOCAINE) 5 % ointment Apply 2-3 application topically 4 (four) times daily. Pt applies to chest and legs.    [provider]  lisinopril (PRINIVIL,ZESTRIL) 5 MG tablet Take 1 tablet (5 mg total) by mouth daily. 08/31/17 11/29/17  Herminio Commons, MD  metoprolol tartrate (LOPRESSOR) 25 MG tablet Take 0.5 tablets (12.5 mg total) by mouth 2 (two) times daily. 08/31/17   Herminio Commons, MD  naproxen (NAPROSYN) 500 MG tablet Take 1 tablet (500 mg total) by mouth 2 (two) times daily. 08/17/17   Varney Biles, MD  NONFORMULARY OR COMPOUNDED ITEM APPLY 1 GRAM (1 PUMP) TO THE  AFFECTED AREA(S) UP TO 4 TIMES A DAY                        DRUG NAME: B5 D3 K10 Pent+ 03/08/17   [provider]  oxyCODONE-acetaminophen (PERCOCET/ROXICET) 5-325 MG tablet Take 1 tablet by mouth every 4 (four) hours as needed. 10/05/17   Carmin Muskrat, MD  RANEXA 500 MG 12 hr tablet TAKE (1) TABLET BY MOUTH TWICE DAILY. 05/31/17   Herminio Commons, MD    Family History Family History  Problem Relation Age of Onset  . Diabetes Father   . Hypertension Father   . Arthritis Unknown   . Diabetes Unknown   . Asthma Unknown   . Stroke Sister     Social History Social History   Tobacco Use  . Smoking status: Current Every Day Smoker    Packs/day: 0.25    Years: 30.00    Pack years: 7.50    Types: Cigarettes  . Smokeless tobacco: Never Used  . Tobacco comment: 3 cigs per day  Substance Use Topics  . Alcohol use: No  . Drug use: No     Allergies   Aspirin   Review of Systems Review of Systems ROS: Statement: All systems negative except as marked or noted in the HPI; Constitutional: Negative for fever and chills. ; ; Eyes: Negative for eye pain, redness and discharge. ; ; ENMT: Negative for ear pain, hoarseness, nasal congestion, sinus pressure and sore throat. ; ; Cardiovascular: Negative for palpitations, diaphoresis, dyspnea and peripheral edema. ; ; Respiratory: Negative for cough, wheezing and stridor. ; ; Gastrointestinal: Negative for nausea, vomiting, diarrhea, abdominal pain, blood in stool, hematemesis, jaundice and rectal bleeding. . ; ; Genitourinary: Negative for dysuria, flank pain and hematuria. ; ; Musculoskeletal: +right ankle pain, chronic chest wall pain.. Negative for back pain and neck pain. Negative for swelling and deformity.; ; Skin: Negative for pruritus, rash, abrasions, blisters, bruising and skin lesion.; ; Neuro: +chronic headache, possible seizure. Negative for lightheadedness and neck stiffness. Negative for weakness, altered level of  consciousness, altered mental status, extremity weakness, paresthesias, and syncope.      Physical Exam Updated Vital Signs BP (!) 155/96   Pulse (!) 50   Temp 98 F (36.7 C) (Oral)   Resp 16   Ht 5\' 7"  (1.702  m)   Wt 74.8 kg   SpO2 100%   BMI 25.84 kg/m   Physical Exam 0955: Physical examination:  Nursing notes reviewed; Vital signs and O2 SAT reviewed;  Constitutional: Well developed, Well nourished, Well hydrated, In no acute distress; Head:  Normocephalic, atraumatic; Eyes: EOMI, PERRL, No scleral icterus; ENMT: Mouth and pharynx normal, Mucous membranes moist; Neck: Supple, Full range of motion, No lymphadenopathy; Cardiovascular: Regular rate and rhythm, No gallop; Respiratory: Breath sounds clear & equal bilaterally, No wheezes.  Speaking full sentences with ease, Normal respiratory effort/excursion; Chest: +right upper anterior chest wall tender to palp. No deformity, no rash, no ecchymosis, no abrasions. No soft tissue crepitus. Movement normal; Abdomen: Soft, Nontender, Nondistended, Normal bowel sounds; Genitourinary: No CVA tenderness; Spine:  No midline CS, TS, LS tenderness.;;  Extremities: Peripheral pulses normal, +NT right ankle, NMS intact right foot, strong pedal pp, LE muscle compartments soft.  No right proximal fibular head tenderness, no knee tenderness, no foot tenderness.  No deformity, no ecchymosis, no open wounds.  +plantarflexion of right foot w/calf squeeze.  No palpable gap right Achilles's tendon.  No tenderness, No edema, No calf edema or asymmetry.; Neuro: AA&Ox3, Major CN grossly intact.  Speech clear. No gross focal motor or sensory deficits in extremities.; Skin: Color normal, Warm, Dry.   ED Treatments / Results  Labs (all labs ordered are listed, but only abnormal results are displayed)   EKG EKG Interpretation  Date/Time:  Sunday March 18 2018 09:21:57 EDT Ventricular Rate:  65 PR Interval:    QRS Duration: 89 QT Interval:  420 QTC  Calculation: 437 R Axis:   -60 Text Interpretation:  Sinus rhythm Left anterior fascicular block When compared with ECG of 10/05/2017 No significant change was found Confirmed by Francine Graven [phone]) on 03/18/2018 10:39:19 AM   Radiology   Procedures Procedures (including critical care time)  Medications Ordered in ED Medications  oxyCODONE-acetaminophen (PERCOCET/ROXICET) 5-325 MG per tablet 1 tablet (1 tablet Oral Given 03/18/18 1021)  levETIRAcetam (KEPPRA) tablet 1,000 mg (1,000 mg Oral Given 03/18/18 1022)     Initial Impression / Assessment and Plan / ED Course  I have reviewed the triage vital signs and the nursing notes.  Pertinent labs & imaging results that were available during my care of the patient were reviewed by me and considered in my medical decision making (see chart for details).  MDM Reviewed: previous chart, nursing note and vitals Reviewed previous: labs and ECG Interpretation: labs, ECG, x-ray and CT scan    Results for orders placed or performed during the hospital encounter of 03/18/18  Ethanol  Result Value Ref Range   Alcohol, Ethyl (B) <10 <10 mg/dL  Troponin I  Result Value Ref Range   Troponin I <0.03 <0.03 ng/mL  Basic metabolic panel  Result Value Ref Range   Sodium 138 135 - 145 mmol/L   Potassium 4.7 3.5 - 5.1 mmol/L   Chloride 105 98 - 111 mmol/L   CO2 28 22 - 32 mmol/L   Glucose, Bld 152 (H) 70 - 99 mg/dL   BUN 16 6 - 20 mg/dL   Creatinine, Ser 1.34 (H) 0.61 - 1.24 mg/dL   Calcium 8.8 (L) 8.9 - 10.3 mg/dL   GFR calc non Af Amer 59 (L) >60 mL/min   GFR calc Af Amer >60 >60 mL/min   Anion gap 5 5 - 15  CBC with Differential  Result Value Ref Range   WBC 4.7 4.0 - 10.5 K/uL  RBC 3.89 (L) 4.22 - 5.81 MIL/uL   Hemoglobin 12.0 (L) 13.0 - 17.0 g/dL   HCT 35.5 (L) 39.0 - 52.0 %   MCV 91.3 78.0 - 100.0 fL   MCH 30.8 26.0 - 34.0 pg   MCHC 33.8 30.0 - 36.0 g/dL   RDW 13.2 11.5 - 15.5 %   Platelets 172 150 - 400 K/uL   Neutrophils  Relative % 39 %   Neutro Abs 1.8 1.7 - 7.7 K/uL   Lymphocytes Relative 48 %   Lymphs Abs 2.2 0.7 - 4.0 K/uL   Monocytes Relative 10 %   Monocytes Absolute 0.5 0.1 - 1.0 K/uL   Eosinophils Relative 3 %   Eosinophils Absolute 0.1 0.0 - 0.7 K/uL   Basophils Relative 0 %   Basophils Absolute 0.0 0.0 - 0.1 K/uL  Valproic acid level  Result Value Ref Range   Valproic Acid Lvl 69 50.0 - 100.0 ug/mL  Urine rapid drug screen (hosp performed)  Result Value Ref Range   Opiates NONE DETECTED NONE DETECTED   Cocaine POSITIVE (A) NONE DETECTED   Benzodiazepines POSITIVE (A) NONE DETECTED   Amphetamines NONE DETECTED NONE DETECTED   Tetrahydrocannabinol POSITIVE (A) NONE DETECTED   Barbiturates NONE DETECTED NONE DETECTED  Troponin I  Result Value Ref Range   Troponin I <0.03 <0.03 ng/mL   Dg Chest 2 View Result Date: 03/18/2018 CLINICAL DATA:  Acute seizure today with fall. EXAM: CHEST - 2 VIEW COMPARISON:  10/05/2017 and prior radiographs FINDINGS: Mild cardiomegaly and CABG changes again noted. There is no evidence of focal airspace disease, pulmonary edema, suspicious pulmonary nodule/mass, pleural effusion, or pneumothorax. No acute bony abnormalities are identified. Cervical fusion hardware noted. IMPRESSION: No active cardiopulmonary disease. Electronically Signed   By: Margarette Canada M.D.   On: 03/18/2018 11:13   Dg Ankle Complete Right Result Date: 03/18/2018 CLINICAL DATA:  Acute RIGHT ankle pain following fall today. Initial encounter. EXAM: RIGHT ANKLE - COMPLETE 3+ VIEW COMPARISON:  09/16/2010 radiographs FINDINGS: No acute fracture, subluxation or dislocation identified. Mild chronic irregularity along the anterior distal tibia is unchanged. A small calcaneal spur is present. Mild degenerative changes the tibiotalar joint noted. IMPRESSION: No acute abnormality. Electronically Signed   By: Margarette Canada M.D.   On: 03/18/2018 11:14   Ct Head Wo Contrast Result Date: 03/18/2018 CLINICAL DATA:   Seizure and fall. EXAM: CT HEAD WITHOUT CONTRAST CT CERVICAL SPINE WITHOUT CONTRAST TECHNIQUE: Multidetector CT imaging of the head and cervical spine was performed following the standard protocol without intravenous contrast. Multiplanar CT image reconstructions of the cervical spine were also generated. COMPARISON:  CT head dated July 05, 2017. MRI cervical spine dated May 15, 2017. FINDINGS: CT HEAD FINDINGS Brain: No evidence of acute infarction, hemorrhage, hydrocephalus, extra-axial collection or mass lesion/mass effect. Vascular: Atherosclerotic vascular calcification of the carotid siphons. No hyperdense vessel. Skull: Normal. Negative for fracture or focal lesion. Sinuses/Orbits: No acute finding. Other: None. CT CERVICAL SPINE FINDINGS Alignment: Unchanged reversal of the normal cervical lordosis and 2 mm retrolisthesis at C5-C6. No traumatic malalignment. Skull base and vertebrae: No acute fracture. No primary bone lesion or focal pathologic process. Prior C5-C6 ACDF with good incorporation of the interbody spacer. No evidence of hardware failure or loosening. Soft tissues and spinal canal: No prevertebral fluid or swelling. No visible canal hematoma. Disc levels: Unchanged moderate uncovertebral hypertrophy at C5-C6. Disc heights are preserved. Upper chest: Negative. Other: None. IMPRESSION: 1.  No acute intracranial abnormality. 2.  No acute cervical spine fracture. Electronically Signed   By: Titus Dubin M.D.   On: 03/18/2018 11:16   Ct Cervical Spine Wo Contrast Result Date: 03/18/2018 CLINICAL DATA:  Seizure and fall. EXAM: CT HEAD WITHOUT CONTRAST CT CERVICAL SPINE WITHOUT CONTRAST TECHNIQUE: Multidetector CT imaging of the head and cervical spine was performed following the standard protocol without intravenous contrast. Multiplanar CT image reconstructions of the cervical spine were also generated. COMPARISON:  CT head dated July 05, 2017. MRI cervical spine dated May 15, 2017. FINDINGS: CT HEAD FINDINGS Brain: No evidence of acute infarction, hemorrhage, hydrocephalus, extra-axial collection or mass lesion/mass effect. Vascular: Atherosclerotic vascular calcification of the carotid siphons. No hyperdense vessel. Skull: Normal. Negative for fracture or focal lesion. Sinuses/Orbits: No acute finding. Other: None. CT CERVICAL SPINE FINDINGS Alignment: Unchanged reversal of the normal cervical lordosis and 2 mm retrolisthesis at C5-C6. No traumatic malalignment. Skull base and vertebrae: No acute fracture. No primary bone lesion or focal pathologic process. Prior C5-C6 ACDF with good incorporation of the interbody spacer. No evidence of hardware failure or loosening. Soft tissues and spinal canal: No prevertebral fluid or swelling. No visible canal hematoma. Disc levels: Unchanged moderate uncovertebral hypertrophy at C5-C6. Disc heights are preserved. Upper chest: Negative. Other: None. IMPRESSION: 1.  No acute intracranial abnormality. 2.  No acute cervical spine fracture. Electronically Signed   By: Titus Dubin M.D.   On: 03/18/2018 11:16     1430:  Troponin x2 negative; doubt ACS as cause for CP today. Pt's usual Keppra given while in the ED. Depakote within therapeutic range. Pt has tol PO well without N/V. No seizures while in the ED. States he is ready to go home now. Dx and testing d/w pt.  Questions answered.  Verb understanding, agreeable to d/c home with outpt f/u.     Final Clinical Impressions(s) / ED Diagnoses   Final diagnoses:  None    ED Discharge Orders    None       Francine Graven, DO 03/22/18 0014

## 2018-03-18 NOTE — Discharge Instructions (Addendum)
Take your usual prescriptions as previously directed. Apply moist heat or ice to the area(s) of discomfort, for 15 minutes at a time, several times per day for the next few days.  Do not fall asleep on a heating or ice pack.  Call your regular medical doctor and your Neurologist on Monday to schedule a follow up appointment this week.  Return to the Emergency Department immediately if worsening.

## 2018-03-18 NOTE — ED Triage Notes (Signed)
Pt reports waking up on the floor around 5am assuming he had a seizure.  Pt also c/o chest pain.  States he always gets chest pain with seizures.  Given Ntg by ems with no change.  Took Keppra this morning but did not take bp meds.

## 2018-03-20 ENCOUNTER — Telehealth: Payer: Self-pay | Admitting: Diagnostic Neuroimaging

## 2018-03-20 NOTE — Telephone Encounter (Signed)
Pt has called stating that he has just come home from [facility] for a bad seizure.  Pt states that he is unable to get rid of the horrible headaches he has been having.  Pt was asked if he wanted to schedule an appointment.  Pt declined an appointment and is 1st asking that RN calls him

## 2018-03-20 NOTE — Telephone Encounter (Signed)
Pt has returned the call to RN Megan, he is asking for a call back °

## 2018-03-20 NOTE — Telephone Encounter (Signed)
I contacted the patient back. Patient was recently seen in the ED on 03/18/2018 for a reported seizure. Patient confirmed he has been taking his Keppra and Depakote and since the seizure has had a migraine. Patient wanted to know if we could send a prescription medication to help with his present Migraine. I spoke with Dr. Leta Baptist regarding this message and He recommend the patient come in to be seen and medicate the migraine with otc medication. The last office visit for this patient was in June of 2018 and the patient had canceled a follow up in December of 2018. During his last office visit migraines were not discussed. Due to the patient's medical history I advised we would need to see him before prescribing further medications. Patient made a wear compliance with his Depakote would help with his migraines and he could take tylenol or ibuprofen otc until he is able to be seen. Patient agreed to schedule an appointment for 03/26/2018 at 1 pm.   MB RN.

## 2018-03-20 NOTE — Telephone Encounter (Signed)
Requested a call back from the patient to further discuss. MB RN 

## 2018-03-21 ENCOUNTER — Encounter: Payer: Self-pay | Admitting: Internal Medicine

## 2018-03-26 ENCOUNTER — Ambulatory Visit: Payer: Self-pay | Admitting: Diagnostic Neuroimaging

## 2018-04-03 ENCOUNTER — Telehealth: Payer: Self-pay | Admitting: *Deleted

## 2018-04-03 NOTE — Telephone Encounter (Signed)
Pt says for the last 2 days all he can smell is blood - denies any bleeding or any symptoms  - says he does have intense smells when he has seizures however it hasn't been as bad as the blood he is smelling now. Says he has contacted Dr Legrand Rams who he just seen Friday and offered no help and neurologist who can't see him until December - pharmacist suggested he contact our office - explained that I would forward to provider but may want to check on getting in with another neurologist that could see him before December since was recently in ED

## 2018-04-04 ENCOUNTER — Emergency Department (HOSPITAL_COMMUNITY)
Admission: EM | Admit: 2018-04-04 | Discharge: 2018-04-04 | Disposition: A | Discharge status: Home / Self Care | Payer: Medicaid Other | Attending: Emergency Medicine | Admitting: Emergency Medicine

## 2018-04-04 ENCOUNTER — Other Ambulatory Visit: Payer: Self-pay

## 2018-04-04 ENCOUNTER — Encounter (HOSPITAL_COMMUNITY): Payer: Self-pay

## 2018-04-04 ENCOUNTER — Emergency Department (HOSPITAL_COMMUNITY): Payer: Medicaid Other

## 2018-04-04 DIAGNOSIS — I2510 Atherosclerotic heart disease of native coronary artery without angina pectoris: Secondary | ICD-10-CM | POA: Diagnosis not present

## 2018-04-04 DIAGNOSIS — F17210 Nicotine dependence, cigarettes, uncomplicated: Secondary | ICD-10-CM | POA: Diagnosis not present

## 2018-04-04 DIAGNOSIS — R0789 Other chest pain: Secondary | ICD-10-CM | POA: Insufficient documentation

## 2018-04-04 DIAGNOSIS — Z7902 Long term (current) use of antithrombotics/antiplatelets: Secondary | ICD-10-CM | POA: Insufficient documentation

## 2018-04-04 DIAGNOSIS — Z794 Long term (current) use of insulin: Secondary | ICD-10-CM | POA: Diagnosis not present

## 2018-04-04 DIAGNOSIS — E119 Type 2 diabetes mellitus without complications: Secondary | ICD-10-CM | POA: Insufficient documentation

## 2018-04-04 DIAGNOSIS — Z79899 Other long term (current) drug therapy: Secondary | ICD-10-CM | POA: Insufficient documentation

## 2018-04-04 DIAGNOSIS — E785 Hyperlipidemia, unspecified: Secondary | ICD-10-CM | POA: Diagnosis not present

## 2018-04-04 DIAGNOSIS — I252 Old myocardial infarction: Secondary | ICD-10-CM | POA: Diagnosis not present

## 2018-04-04 DIAGNOSIS — R079 Chest pain, unspecified: Secondary | ICD-10-CM | POA: Diagnosis present

## 2018-04-04 DIAGNOSIS — I10 Essential (primary) hypertension: Secondary | ICD-10-CM | POA: Diagnosis not present

## 2018-04-04 LAB — CBC
HEMATOCRIT: 39 % (ref 39.0–52.0)
HEMOGLOBIN: 13.8 g/dL (ref 13.0–17.0)
MCH: 31.9 pg (ref 26.0–34.0)
MCHC: 35.4 g/dL (ref 30.0–36.0)
MCV: 90.1 fL (ref 78.0–100.0)
Platelets: 180 10*3/uL (ref 150–400)
RBC: 4.33 MIL/uL (ref 4.22–5.81)
RDW: 13.1 % (ref 11.5–15.5)
WBC: 5.4 10*3/uL (ref 4.0–10.5)

## 2018-04-04 LAB — BASIC METABOLIC PANEL
ANION GAP: 7 (ref 5–15)
BUN: 22 mg/dL — ABNORMAL HIGH (ref 6–20)
CALCIUM: 8.8 mg/dL — AB (ref 8.9–10.3)
CO2: 27 mmol/L (ref 22–32)
Chloride: 105 mmol/L (ref 98–111)
Creatinine, Ser: 1.45 mg/dL — ABNORMAL HIGH (ref 0.61–1.24)
GFR calc Af Amer: >60 mL/min (ref >60)
GFR, EST NON AFRICAN AMERICAN: 53 mL/min — AB (ref >60)
Glucose, Bld: 132 mg/dL — ABNORMAL HIGH (ref 70–99)
POTASSIUM: 4.1 mmol/L (ref 3.5–5.1)
Sodium: 139 mmol/L (ref 135–145)

## 2018-04-04 LAB — HEPATIC FUNCTION PANEL
ALBUMIN: 3.9 g/dL (ref 3.5–5.0)
ALK PHOS: 41 U/L (ref 38–126)
ALT: 17 U/L (ref 0–44)
AST: 17 U/L (ref 15–41)
BILIRUBIN TOTAL: 0.8 mg/dL (ref 0.3–1.2)
Bilirubin, Direct: <0.1 mg/dL (ref 0.0–0.2)
TOTAL PROTEIN: 6.8 g/dL (ref 6.5–8.1)

## 2018-04-04 LAB — DIFFERENTIAL
BASOS ABS: 0 10*3/uL (ref 0.0–0.1)
Basophils Relative: 0 %
EOS PCT: 2 %
Eosinophils Absolute: 0.1 10*3/uL (ref 0.0–0.7)
LYMPHS ABS: 2.6 10*3/uL (ref 0.7–4.0)
LYMPHS PCT: 46 %
Monocytes Absolute: 0.6 10*3/uL (ref 0.1–1.0)
Monocytes Relative: 10 %
NEUTROS PCT: 42 %
Neutro Abs: 2.3 10*3/uL (ref 1.7–7.7)

## 2018-04-04 LAB — TROPONIN I

## 2018-04-04 MED ORDER — OXYCODONE-ACETAMINOPHEN 5-325 MG PO TABS: 1.0000 | ORAL_TABLET | Freq: Four times a day (QID) | ORAL | 0 refills | Status: DC | PRN

## 2018-04-04 MED ORDER — OXYCODONE-ACETAMINOPHEN 5-325 MG PO TABS
1.0000 | ORAL_TABLET | Freq: Once | ORAL | Status: AC
  Administered 2018-04-04: 1 via ORAL
  Filled 2018-04-04: qty 1

## 2018-04-04 MED ORDER — MORPHINE SULFATE (PF) 4 MG/ML IV SOLN
6.0000 mg | Freq: Once | INTRAVENOUS | Status: AC
  Administered 2018-04-04: 6 mg via INTRAVENOUS
  Filled 2018-04-04: qty 2
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
## 2018-04-04 NOTE — Telephone Encounter (Addendum)
I called pt.  He stated that he had sz 03-18-18 was at  [facility].  Gets migraine headaches after his seizures.  He is in [facility] as we speak asking for appt.  He states that he is smelling fresh blood for the last 3 days. This is a new sx, feels like it may be sz related.  He gets migraines after he has a seizure.  He takes OTC meds but they do not help.

## 2018-04-04 NOTE — ED Notes (Signed)
ED Provider at bedside. 

## 2018-04-04 NOTE — ED Provider Notes (Signed)
[facility] EMERGENCY DEPARTMENT Provider Note   CSN: [id number] Arrival date & time: 04/04/18  6384     History   Chief Complaint Chief Complaint  Patient presents with  . Chest Pain    HPI Melvin Taylor is a 52 y.o. male.  Patient complains of some chest discomfort.  Is been going on for over a week.  The history is provided by the patient. No language interpreter was used.  Chest Pain   This is a recurrent problem. The current episode started more than 1 week ago. The problem occurs daily. The problem has not changed since onset.The pain is associated with movement. The pain is present in the substernal region. The pain is at a severity of 6/10. The quality of the pain is described as burning. The pain does not radiate. Pertinent negatives include no abdominal pain, no back pain, no cough and no headaches.  Pertinent negatives for past medical history include no seizures.    Past Medical History:  Diagnosis Date  . Anxiety   . Bipolar 1 disorder ([place])   . Cervical radiculopathy   . Chest pain   . Chronic back pain   . Chronic chest wall pain   . Chronic neck pain   . Coronary artery disease   . Depression   . Diabetes mellitus    Type II  . Hallucinations    "long history of them"  . Headache(784.0)   . History of gout   . HTN (hypertension)   . Insomnia   . Neuropathy   . Pain management   . Polysubstance abuse ([place])   . Right leg pain    chronic  . Schizophrenia ([place])   . Seizures ([place])    last sz between July 5-9th, 2016; epilepsy  . Shortness of breath dyspnea    with exertion  . Sleep apnea   . Transfusion of blood product refused for religious reason     Patient Active Problem List   Diagnosis Date Noted  . CAD (coronary artery disease) 03/28/2016  . Chest pain 03/21/2016  . Hyperlipidemia 03/11/2016  . Ischemic chest pain   . NSTEMI (non-ST elevated myocardial infarction) ([place])   . Coronary atherosclerosis of native coronary artery  03/03/2016  . Cervical spinal stenosis 12/14/2015  . Loss of weight 08/25/2015  . Essential hypertension, benign 07/23/2015  . Personal history of noncompliance with medical treatment, presenting hazards to health 07/23/2015  . Abdominal pain 06/08/2015  . Constipation 06/08/2015  . History of colonic polyps   . Diverticulosis of colon without hemorrhage   . Mucosal abnormality of stomach   . Encounter for screening colonoscopy 04/16/2015  . Dysphagia 04/16/2015  . Partial symptomatic epilepsy with complex partial seizures, intractable, without status epilepticus ([place]) 11/10/2014  . DM type 2 causing vascular disease ([place]) 06/29/2014  . Musculoskeletal pain 06/29/2014  . Hyperglycemia without ketosis 09/07/2013  . 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 Knee pain 12/28/2010  . Medial meniscus, posterior horn derangement 12/28/2010    Past Surgical History:  Procedure Laterality Date  . ANTERIOR CERVICAL DECOMP/DISCECTOMY FUSION N/A 12/14/2015   Procedure: ANTERIOR CERVICAL DECOMPRESSION/DISCECTOMY FUSION CERVICAL FIVE -SIX;  Surgeon: Earnie Larsson, MD;  Location: [place] NEURO ORS;  Service: Neurosurgery;  Laterality: N/A;  . BIOPSY  05/07/2015   Procedure: BIOPSY (Gastric);  Surgeon: [name], MD;  Location: [facility];  Service: Endoscopy;;  . CARDIAC CATHETERIZATION N/A 03/07/2016   Procedure: Left Heart Cath and Coronary Angiography;  Surgeon: Belva Crome, MD;  Location: [place] CV LAB;  Service: Cardiovascular;  Laterality: N/A;  . COLONOSCOPY WITH PROPOFOL N/A 05/07/2015   JQB:[id number] diverticulosis, multiple colon polyps removed, tubular adenoma, serrated colon polyp. Next colonoscopy October 2019  . CORONARY ARTERY BYPASS GRAFT N/A 03/28/2016   Procedure: CORONARY ARTERY BYPASS GRAFTING (CABG) x 5 USING GREATER SAPHENOUS VEIN;  Surgeon: [name], MD;  Location: [place] OR;  Service: Open Heart Surgery;  Laterality: N/A;  . ENDOVEIN HARVEST OF GREATER SAPHENOUS  VEIN Right 03/28/2016   Procedure: ENDOVEIN HARVEST OF GREATER SAPHENOUS VEIN;  Surgeon: [name], MD;  Location: [facility];  Service: Open Heart Surgery;  Laterality: Right;  . ESOPHAGEAL DILATION N/A 05/07/2015   Procedure: ESOPHAGEAL DILATION WITH 56FR MALONEY DILATOR;  Surgeon: [name], MD;  Location: [facility];  Service: Endoscopy;  Laterality: N/A;  . ESOPHAGOGASTRODUODENOSCOPY (EGD) WITH PROPOFOL N/A 05/07/2015   RMR: Status post dilation of normal esophagus. Gastritis.  Marland Kitchen KNEE SURGERY Left    arthroscopy  . MANDIBLE FRACTURE SURGERY    . POLYPECTOMY  05/07/2015   Procedure: POLYPECTOMY (Hepatic Flexure, Distal Transverse Colon, Rectal);  Surgeon: [name], MD;  Location: [facility];  Service: Endoscopy;;  . TEE WITHOUT CARDIOVERSION N/A 03/28/2016   Procedure: TRANSESOPHAGEAL ECHOCARDIOGRAM (TEE);  Surgeon: [name], MD;  Location: [place];  Service: Open Heart Surgery;  Laterality: N/A;  . TONSILLECTOMY          Home Medications    Prior to Admission medications   Medication Sig Start Date End Date Taking? Authorizing Provider  albuterol (PROVENTIL HFA;VENTOLIN HFA) 108 (90 Base) MCG/ACT inhaler Inhale 1-2 puffs into the lungs every 6 (six) hours as needed for wheezing or shortness of breath.   Yes [provider]  amLODipine (NORVASC) 10 MG tablet TAKE 1 TABLET BY MOUTH ONCE DAILY. 07/07/17  Yes Herminio Commons, MD  clopidogrel (PLAVIX) 75 MG tablet Take 1 tablet (75 mg total) by mouth daily. 04/18/16  Yes Lendon Colonel, NP  cyclobenzaprine (FLEXERIL) 10 MG tablet Take 1 tablet (10 mg total) by mouth 3 (three) times daily as needed. 09/11/17  Yes Triplett, Tammy, PA-C  diazepam (VALIUM) 5 MG tablet Take 5 mg by mouth every 6 (six) hours as needed for anxiety.   Yes [provider]  divalproex (DEPAKOTE) 500 MG DR tablet Take 3 tablets (1,500 mg total) by mouth 2 (two) times daily. 01/04/17  Yes Penumalli, Earlean Polka, MD  Doxepin HCl 5 % CREA Apply 2  g topically 3 (three) times daily. Pt applies to chest and legs.   Yes [provider]  Insulin Glargine (LANTUS SOLOSTAR) 100 UNIT/ML Solostar Pen Inject 30 Units into the skin at bedtime. Patient taking differently: Inject 30 Units into the skin at bedtime as needed (for high blood sugars if over 200).  03/03/17  Yes Cassandria Anger, MD  isosorbide mononitrate (IMDUR) 60 MG 24 hr tablet Take 90 mg by mouth daily.   Yes [provider]  levETIRAcetam (KEPPRA) 1000 MG tablet Take 1 tablet (1,000 mg total) by mouth 2 (two) times daily. 01/04/17  Yes Penumalli, Earlean Polka, MD  levETIRAcetam (KEPPRA) 250 MG tablet Take 1 tablet (250 mg total) by mouth 2 (two) times daily. 01/31/18  Yes Fawze, Mina A, PA-C  lidocaine (XYLOCAINE) 5 % ointment Apply 2-3 application topically 4 (four) times daily. Pt applies to chest and legs.   Yes [provider]  lisinopril (PRINIVIL,ZESTRIL)  5 MG tablet Take 1 tablet (5 mg total) by mouth daily. 08/31/17 04/04/18 Yes Herminio Commons, MD  metoprolol tartrate (LOPRESSOR) 25 MG tablet Take 0.5 tablets (12.5 mg total) by mouth 2 (two) times daily. 08/31/17  Yes Herminio Commons, MD  NONFORMULARY OR COMPOUNDED ITEM APPLY 1 GRAM (1 PUMP) TO THE AFFECTED AREA(S) UP TO 4 TIMES A DAY                        DRUG NAME: [id number] [id number] Pent+ 03/08/17  Yes [provider]  RANEXA 500 MG 12 hr tablet TAKE (1) TABLET BY MOUTH TWICE DAILY. 05/31/17  Yes Herminio Commons, MD  ACCU-CHEK AVIVA PLUS test strip USE AS DIRECTED TO TEST 4 TIMES DAILY. 07/28/17   Cassandria Anger, MD  benzonatate (TESSALON) 100 MG capsule Take 1 capsule (100 mg total) by mouth every 8 (eight) hours as needed for cough. Patient not taking: Reported on 08/17/2017 08/03/17   Orpah Greek, MD  naproxen (NAPROSYN) 500 MG tablet Take 1 tablet (500 mg total) by mouth 2 (two) times daily. Patient not taking: Reported on 04/04/2018 08/17/17   Varney Biles, MD    oxyCODONE-acetaminophen (PERCOCET/ROXICET) 5-325 MG tablet Take 1 tablet by mouth every 6 (six) hours as needed. 04/04/18   Milton Ferguson, MD    Family History Family History  Problem Relation Age of Onset  . Diabetes Father   . Hypertension Father   . Arthritis Unknown   . Diabetes Unknown   . Asthma Unknown   . Stroke Sister     Social History Social History   Tobacco Use  . Smoking status: Current Every Day Smoker    Packs/day: 0.25    Years: 30.00    Pack years: 7.50    Types: Cigarettes  . Smokeless tobacco: Never Used  . Tobacco comment: 3 cigs per day  Substance Use Topics  . Alcohol use: No  . Drug use: No     Allergies   Aspirin   Review of Systems Review of Systems  Constitutional: Negative for appetite change and fatigue.  HENT: Negative for congestion, ear discharge and sinus pressure.   Eyes: Negative for discharge.  Respiratory: Negative for cough.   Cardiovascular: Positive for chest pain.  Gastrointestinal: Negative for abdominal pain and diarrhea.  Genitourinary: Negative for frequency and hematuria.  Musculoskeletal: Negative for back pain.  Skin: Negative for rash.  Neurological: Negative for seizures and headaches.  Psychiatric/Behavioral: Negative for hallucinations.     Physical Exam Updated Vital Signs BP (!) 171/103   Pulse (!) 54   Temp 98 F (36.7 C) (Oral)   Resp 16   Ht 5\' 7"  (1.702 m)   Wt 72.6 kg   SpO2 99%   BMI 25.06 kg/m   Physical Exam  Constitutional: He is oriented to person, place, and time. He appears well-developed.  HENT:  Head: Normocephalic.  Eyes: Conjunctivae and EOM are normal. No scleral icterus.  Neck: Neck supple. No thyromegaly present.  Cardiovascular: Normal rate and regular rhythm. Exam reveals no gallop and no friction rub.  No murmur heard. Pulmonary/Chest: No stridor. He has no wheezes. He has no rales. He exhibits no tenderness.  Abdominal: He exhibits no distension. There is no  tenderness. There is no rebound.  Musculoskeletal: Normal range of motion. He exhibits no edema.  Lymphadenopathy:    He has no cervical adenopathy.  Neurological: He is oriented to person, place, and  time. He exhibits normal muscle tone. Coordination normal.  Skin: No rash noted. No erythema.  Psychiatric: He has a normal mood and affect. His behavior is normal.     ED Treatments / Results  Labs (all labs ordered are listed, but only abnormal results are displayed) Labs Reviewed  BASIC METABOLIC PANEL - Abnormal; Notable for the following components:      Result Value   Glucose, Bld 132 (*)    BUN 22 (*)    Creatinine, Ser 1.45 (*)    Calcium 8.8 (*)    GFR calc non Af Amer 53 (*)    All other components within normal limits  CBC  TROPONIN I  DIFFERENTIAL  HEPATIC FUNCTION PANEL  TROPONIN I    EKG EKG Interpretation  Date/Time:  Wednesday April 04 2018 09:06:06 EDT Ventricular Rate:  57 PR Interval:    QRS Duration: 79 QT Interval:  422 QTC Calculation: 411 R Axis:   -56 Text Interpretation:  Sinus rhythm Probable left atrial enlargement Left anterior fascicular block Anterior infarct, old Confirmed by Milton Ferguson [phone]) on 04/04/2018 12:39:25 PM   Radiology Dg Chest 2 View  Result Date: 04/04/2018 CLINICAL DATA:  On off chest pain for 2 days. EXAM: CHEST - 2 VIEW COMPARISON:  03/19/2018 FINDINGS: Stable changes from prior CABG surgery. The cardiac silhouette is normal in size and configuration. No mediastinal or hilar masses. No evidence adenopathy. Clear lungs.  No pleural effusion or pneumothorax. Skeletal structures are intact. IMPRESSION: No active cardiopulmonary disease. Electronically Signed   By: Lajean Manes M.D.   On: 04/04/2018 11:06    Procedures Procedures (including critical care time)  Medications Ordered in ED Medications  oxyCODONE-acetaminophen (PERCOCET/ROXICET) 5-325 MG per tablet 1 tablet (1 tablet Oral Given 04/04/18 0942)  morphine 4  MG/ML injection 6 mg (6 mg Intravenous Given 04/04/18 1250)     Initial Impression / Assessment and Plan / ED Course  I have reviewed the triage vital signs and the nursing notes.  Pertinent labs & imaging results that were available during my care of the patient were reviewed by me and considered in my medical decision making (see chart for details).     Patient with some chest pain.  EKG shows no acute changes.  Patient has had 2 troponins are negative.  Patient's pain was relieved with pain medicine.  He does not want to be admitted to the hospital.  I instructed him to return if any significant problems and arrangements have been made for him to see his cardiologist next week  Final Clinical Impressions(s) / ED Diagnoses   Final diagnoses:  Atypical chest pain    ED Discharge Orders         Ordered    oxyCODONE-acetaminophen (PERCOCET/ROXICET) 5-325 MG tablet  Every 6 hours PRN     04/04/18 1434           Milton Ferguson, MD 04/04/18 1443

## 2018-04-04 NOTE — Telephone Encounter (Signed)
Patient calling to discuss complications he is having with seizure medication Keppra and would not go into detail.

## 2018-04-04 NOTE — Telephone Encounter (Signed)
Pt aware and voiced understanding - has upcoming appt with Estella Husk, PA and neurologist

## 2018-04-04 NOTE — Discharge Instructions (Addendum)
Follow-up with Dr. Bronson Ing next week

## 2018-04-04 NOTE — ED Notes (Signed)
Pt verbalized understanding of no driving and to use caution within 4 hours of taking pain meds due to meds cause drowsiness 

## 2018-04-04 NOTE — ED Notes (Signed)
Appt made with Cardiology for 04-23-18 at 1:30 pm with Sharyn Lull

## 2018-04-04 NOTE — Telephone Encounter (Signed)
Spoke to pt and and made appt for 04-11-18 at 1330 to evaluate for migraines after seizures.  (reinforced to come in at 1300 for check in).  Pt wrote down appt.  Was leaving the hospital for chest pain.

## 2018-04-04 NOTE — Telephone Encounter (Signed)
Not something that we would be able to manage for him as this is not a cardiac issue, I would refer him back to his pcp   Zandra Abts MD

## 2018-04-04 NOTE — Telephone Encounter (Signed)
Pt cancelled the 03-26-18 appt.  Will offer 04-11-18 at 1330.

## 2018-04-04 NOTE — ED Notes (Addendum)
Pt refused to have second trop drawn, explained to pt why it was ordered and pt still refused,  When asked why-pt stated that he had it done last time he was here and didn't want it done.  Dr. Roderic Palau made aware that pt refused lab draw.  Dr. Roderic Palau stated that he would see pt again in a few minutes.  Trop. Will be canceled in orders.

## 2018-04-11 ENCOUNTER — Ambulatory Visit: Payer: Medicaid Other | Admitting: Diagnostic Neuroimaging

## 2018-04-11 ENCOUNTER — Encounter: Payer: Self-pay | Admitting: Diagnostic Neuroimaging

## 2018-04-11 VITALS — BP 148/99 | HR 68 | Ht 67.0 in | Wt 157.8 lb

## 2018-04-11 DIAGNOSIS — R442 Other hallucinations: Secondary | ICD-10-CM | POA: Diagnosis not present

## 2018-04-11 DIAGNOSIS — F3170 Bipolar disorder, currently in remission, most recent episode unspecified: Secondary | ICD-10-CM | POA: Diagnosis not present

## 2018-04-11 DIAGNOSIS — G40219 Localization-related (focal) (partial) symptomatic epilepsy and epileptic syndromes with complex partial seizures, intractable, without status epilepticus: Secondary | ICD-10-CM

## 2018-04-11 MED ORDER — LEVETIRACETAM 1000 MG PO TABS: 1000.0000 mg | ORAL_TABLET | Freq: Two times a day (BID) | ORAL | 4 refills | Status: DC

## 2018-04-11 MED ORDER — DIVALPROEX SODIUM 500 MG PO DR TAB: 1500.0000 mg | DELAYED_RELEASE_TABLET | Freq: Two times a day (BID) | ORAL | 4 refills | Status: DC

## 2018-04-11 MED ORDER — LEVETIRACETAM 250 MG PO TABS: 250.0000 mg | ORAL_TABLET | Freq: Two times a day (BID) | ORAL | 4 refills | Status: DC

## 2018-04-11 NOTE — Patient Instructions (Signed)
SEIZURE DISORDER - continue depakote 1500mg  twice a day - continue levetiracetam 1250mg  twice a day - stop cocaine use (likely related to breakthrough seizures)
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
## 2018-04-18 NOTE — Progress Notes (Signed)
Cardiology Office Note    Date:  04/23/2018   ID:  [name], DOB [birth year], MRN [id number]  PCP:  Rosita Fire, MD  Cardiologist: Kate Sable, MD EPS: None  Chief Complaint  Patient presents with  . Hospitalization Follow-up    History of Present Illness:  Melvin Taylor is a 52 y.o. male with history of CAD status post CABG times five 03/2016.  Has had musculoskeletal chest wall and left arm pain since then.  Multiple ED visits for chest pain.  Also has hypertension hyperlipidemia.  Last seen by Dr. Bronson Ing 08/31/2017 which time metoprolol was decreased to 12.5 mg twice daily because of bradycardia and episodic dizziness.  Lisinopril was started for elevated blood pressure.  Referral was made to pain clinic for musculoskeletal chest pain but he never got in.  Patient was most recently in the emergency room 04/04/2018 with recurrent chest pain.  EKG without acute change.  Troponin negative x2.  Pain relieved with pain medication-Morphine in ER.  Continues to have sharp stabbing chest pain. Worse with cold weather. Feels much better when he takes percocet.BP up today. Isn't sure what meds he's taking. Does get extra salt. Smokes 1ppd.  He was unsure of his medications so we called [facility].  He just got amlodipine filled 5 days ago.  He insisted he was taking a atenolol but they do not have this on file.  He is not on metoprolol.  He is on lisinopril HCTZ 20/12.5 mg daily which he just filled.  Walks 1 to 2 miles daily without difficulty.   Past Medical History:  Diagnosis Date  . Anxiety   . Bipolar 1 disorder ([place])   . Cervical radiculopathy   . Chest pain   . Chronic back pain   . Chronic chest wall pain   . Chronic neck pain   . Coronary artery disease   . Depression   . Diabetes mellitus    Type II  . Hallucinations    "long history of them"  . Headache(784.0)   . History of gout   . HTN (hypertension)   . Insomnia   . Neuropathy   . Pain  management   . Polysubstance abuse ([place])   . Right leg pain    chronic  . Schizophrenia ([place])   . Seizures ([place])    last sz between July 5-9th, 2016; epilepsy  . Shortness of breath dyspnea    with exertion  . Sleep apnea   . Transfusion of blood product refused for religious reason     Past Surgical History:  Procedure Laterality Date  . ANTERIOR CERVICAL DECOMP/DISCECTOMY FUSION N/A 12/14/2015   Procedure: ANTERIOR CERVICAL DECOMPRESSION/DISCECTOMY FUSION CERVICAL FIVE -SIX;  Surgeon: Earnie Larsson, MD;  Location: [facility];  Service: Neurosurgery;  Laterality: N/A;  . BIOPSY  05/07/2015   Procedure: BIOPSY (Gastric);  Surgeon: [name], MD;  Location: [facility];  Service: Endoscopy;;  . CARDIAC CATHETERIZATION N/A 03/07/2016   Procedure: Left Heart Cath and Coronary Angiography;  Surgeon: Belva Crome, MD;  Location: [facility];  Service: Cardiovascular;  Laterality: N/A;  . COLONOSCOPY WITH PROPOFOL N/A 05/07/2015   JJK:[id number] diverticulosis, multiple colon polyps removed, tubular adenoma, serrated colon polyp. Next colonoscopy October 2019  . CORONARY ARTERY BYPASS GRAFT N/A 03/28/2016   Procedure: CORONARY ARTERY BYPASS GRAFTING (CABG) x 5 USING GREATER SAPHENOUS VEIN;  Surgeon: [name], MD;  Location: [facility];  Service: Open Heart Surgery;  Laterality: N/A;  . ENDOVEIN HARVEST OF GREATER SAPHENOUS VEIN Right 03/28/2016   Procedure: ENDOVEIN HARVEST OF GREATER SAPHENOUS VEIN;  Surgeon: [name], MD;  Location: [facility];  Service: Open Heart Surgery;  Laterality: Right;  . ESOPHAGEAL DILATION N/A 05/07/2015   Procedure: ESOPHAGEAL DILATION WITH 56FR MALONEY DILATOR;  Surgeon: [name], MD;  Location: [facility];  Service: Endoscopy;  Laterality: N/A;  . ESOPHAGOGASTRODUODENOSCOPY (EGD) WITH PROPOFOL N/A 05/07/2015   RMR: Status post dilation of normal esophagus. Gastritis.  Marland Kitchen KNEE SURGERY Left    arthroscopy  . MANDIBLE FRACTURE SURGERY    . POLYPECTOMY   05/07/2015   Procedure: POLYPECTOMY (Hepatic Flexure, Distal Transverse Colon, Rectal);  Surgeon: [name], MD;  Location: [facility];  Service: Endoscopy;;  . TEE WITHOUT CARDIOVERSION N/A 03/28/2016   Procedure: TRANSESOPHAGEAL ECHOCARDIOGRAM (TEE);  Surgeon: [name], MD;  Location: [facility];  Service: Open Heart Surgery;  Laterality: N/A;  . TONSILLECTOMY      Current Medications: No outpatient medications have been marked as taking for the 04/23/18 encounter (Office Visit) with Imogene Burn, PA-C.     Allergies:   Aspirin   Social History   Socioeconomic History  . Marital status: Single    Spouse name: Not on file  . Number of children: Not on file  . Years of education: 11th grade  . Highest education level: Not on file  Occupational History  . Occupation: unemployed    Fish farm manager: NOT EMPLOYED  Social Needs  . Financial resource strain: Not on file  . Food insecurity:    Worry: Not on file    Inability: Not on file  . Transportation needs:    Medical: Not on file    Non-medical: Not on file  Tobacco Use  . Smoking status: Current Every Day Smoker    Packs/day: 1.00    Years: 30.00    Pack years: 30.00    Types: Cigarettes  . Smokeless tobacco: Never Used  . Tobacco comment: 3 cigs per day  Substance and Sexual Activity  . Alcohol use: No  . Drug use: No  . Sexual activity: Not Currently  Lifestyle  . Physical activity:    Days per week: Not on file    Minutes per session: Not on file  . Stress: Not on file  Relationships  . Social connections:    Talks on phone: Not on file    Gets together: Not on file    Attends religious service: Not on file    Active member of club or organization: Not on file    Attends meetings of clubs or organizations: Not on file    Relationship status: Not on file  Other Topics Concern  . Not on file  Social History Narrative   Lives alone   Drinks a cup of coffee a day     Family History:  The patient's family  history includes Arthritis in his unknown relative; Asthma in his unknown relative; Diabetes in his father and unknown relative; Hypertension in his father; Stroke in his sister.   ROS:   Please see the history of present illness.    Review of Systems  Cardiovascular: Positive for chest pain.   All other systems reviewed and are negative.   PHYSICAL EXAM:   VS:  BP (!) 160/90   Pulse 66   Ht 5\' 7"  (1.702 m)   Wt 157 lb 12.8 oz (71.6 kg)   SpO2 98%  BMI 24.71 kg/m   Physical Exam  GEN: Thin, looks older than stated age, smells of cigarettes in no acute distress  Neck: no JVD, carotid bruits, or masses Cardiac:RRR; positive S4, 2/6 systolic murmur in the left sternal border Respiratory: Decreased breath sounds throughout, no rales GI: soft, nontender, nondistended, + BS Ext: without cyanosis, clubbing, or edema, Good distal pulses bilaterally Neuro:  Alert and Oriented x 3 Psych: euthymic mood, full affect  Wt Readings from Last 3 Encounters:  04/23/18 157 lb 12.8 oz (71.6 kg)  04/11/18 157 lb 12.8 oz (71.6 kg)  04/04/18 160 lb (72.6 kg)      Studies/Labs Reviewed:   EKG:  EKG is not ordered today.   Recent Labs: 04/04/2018: ALT 17; BUN 22; Creatinine, Ser 1.45; Hemoglobin 13.8; Platelets 180; Potassium 4.1; Sodium 139   Lipid Panel    Component Value Date/Time   CHOL 120 03/22/2016 0853   TRIG 37 03/22/2016 0853   HDL 60 03/22/2016 0853   CHOLHDL 2.0 03/22/2016 0853   VLDL 7 03/22/2016 0853   LDLCALC 53 03/22/2016 0853    Additional studies/ records that were reviewed today include:  2D echo 8/2017Study Conclusions   - Left ventricle: The cavity size was normal. Wall thickness was   increased in a pattern of moderate LVH. Systolic function was   normal. The estimated ejection fraction was in the range of 60%   to 65%. Wall motion was normal; there were no regional wall   motion abnormalities. Left ventricular diastolic function   parameters were normal. -  Aortic valve: Valve area (VTI): 3.35 cm^2. Valve area (Vmax): 3.5   cm^2. Valve area (Vmean): 3.23 cm^2. - Mitral valve: Mildly calcified annulus. - Atrial septum: No defect or patent foramen ovale was identified. - Technically adequate study.   Intra-Op TEE 8/28/2017Study Conclusions   - Atrial septum: No defect or patent foramen ovale was identified.   Impressions:   - S/P CABG on no inotropic therapy     1) EF is preserved at 50% while DOO pacing, LV remains small with   thickened LV walls, no obvious Regional wall motion abnormalities   2) AV, MV, TV baseline   3) No pericardial effusion, no sign of aortic dissection, no   intracardiac air     All findings shown and discussed with Dr. Cyndia Bent.   Intraoperative transesophageal echocardiography.  Birthdate: Patient birthdate: 12/19/1965.  Age:  Patient is 52 yr old.  Sex: Gender: male.  Study date:  Study date: 03/28/2016. Study time: 09:01 AM.     ASSESSMENT:    1. Coronary artery disease involving native coronary artery of native heart without angina pectoris   2. Essential hypertension, benign   3. Mixed hyperlipidemia   4. Musculoskeletal pain      PLAN:  In order of problems listed above:  CAD status post CABG x5 in 2017 with chronic musculoskeletal pain since then relieved with Percocet.  No further work-up at this time.  Continue Ranexa.  Musculoskeletal chest pain most recent ER visit 04/04/2018 without evidence of CAD.  Pain relieved with morphine and Percocet.  Essential hypertension blood pressure elevated today.  He has not been compliant with his medications.  Resume metoprolol 12.5 mg twice daily.  Blood pressure next week either here or at his PCP.  Follow-up with Dr. Bronson Ing next available..  Mixed hyperlipidemia noncompliant with statin  Tobacco abuse smoking cessation discussed in detail. The patient was counseled on tobacco cessation today for 4 minutes.  Counseling included reviewing the risks  of smoking tobacco products, how it impacts the patient's current medical diagnoses and different strategies for quitting.  Pharmacotherapy to aid in tobacco cessation was not prescribed today as patient unwilling to quit.     Medication Adjustments/Labs and Tests Ordered: Current medicines are reviewed at length with the patient today.  Concerns regarding medicines are outlined above.  Medication changes, Labs and Tests ordered today are listed in the Patient Instructions below. Patient Instructions  Medication Instructions:  Your physician has recommended you make the following change in your medication:  Take Lopressor 12.5 mg Two Times Daily    Labwork: NONE   Testing/Procedures: NONE   Follow-Up: Your physician recommends that you schedule a follow-up appointment with Dr. Bronson Ing.   Your physician recommends that you schedule a follow-up appointment with Dr. Legrand Rams for Blood Pressure Check next week.    Any Other Special Instructions Will Be Listed Below (If Applicable).  Your physician discussed the hazards of tobacco use. Tobacco use cessation is recommended and techniques and options to help you quit were discussed.      If you need a refill on your cardiac medications before your next appointment, please call your pharmacy. Thank you for choosing [facility]!   Low-Sodium Eating Plan Sodium, which is an element that makes up salt, helps you maintain a healthy balance of fluids in your body. Too much sodium can increase your blood pressure and cause fluid and waste to be held in your body. Your health care provider or dietitian may recommend following this plan if you have high blood pressure (hypertension), kidney disease, liver disease, or heart failure. Eating less sodium can help lower your blood pressure, reduce swelling, and protect your heart, liver, and kidneys. What are tips for following this plan? General guidelines  Most people on this plan  should limit their sodium intake to 1,500-2,000 mg (milligrams) of sodium each day. Reading food labels  The Nutrition Facts label lists the amount of sodium in one serving of the food. If you eat more than one serving, you must multiply the listed amount of sodium by the number of servings.  Choose foods with less than 140 mg of sodium per serving.  Avoid foods with 300 mg of sodium or more per serving. Shopping  Look for lower-sodium products, often labeled as "low-sodium" or "no salt added."  Always check the sodium content even if foods are labeled as "unsalted" or "no salt added".  Buy fresh foods. ? Avoid canned foods and premade or frozen meals. ? Avoid canned, cured, or processed meats  Buy breads that have less than 80 mg of sodium per slice. Cooking  Eat more home-cooked food and less restaurant, buffet, and fast food.  Avoid adding salt when cooking. Use salt-free seasonings or herbs instead of table salt or sea salt. Check with your health care provider or pharmacist before using salt substitutes.  Cook with plant-based oils, such as canola, sunflower, or olive oil. Meal planning  When eating at a restaurant, ask that your food be prepared with less salt or no salt, if possible.  Avoid foods that contain MSG (monosodium glutamate). MSG is sometimes added to [place] food, bouillon, and some canned foods. What foods are recommended? The items listed may not be a complete list. Talk with your dietitian about what dietary choices are best for you. Grains Low-sodium cereals, including oats, puffed wheat and rice, and shredded wheat. Low-sodium crackers. Unsalted rice. Unsalted pasta. Low-sodium  bread. Whole-grain breads and whole-grain pasta. Vegetables Fresh or frozen vegetables. "No salt added" canned vegetables. "No salt added" tomato sauce and paste. Low-sodium or reduced-sodium tomato and vegetable juice. Fruits Fresh, frozen, or canned fruit. Fruit juice. Meats and  other protein foods Fresh or frozen (no salt added) meat, poultry, seafood, and fish. Low-sodium canned tuna and salmon. Unsalted nuts. Dried peas, beans, and lentils without added salt. Unsalted canned beans. Eggs. Unsalted nut butters. Dairy Milk. Soy milk. Cheese that is naturally low in sodium, such as ricotta cheese, fresh mozzarella, or Swiss cheese Low-sodium or reduced-sodium cheese. Cream cheese. Yogurt. Fats and oils Unsalted butter. Unsalted margarine with no trans fat. Vegetable oils such as canola or olive oils. Seasonings and other foods Fresh and dried herbs and spices. Salt-free seasonings. Low-sodium mustard and ketchup. Sodium-free salad dressing. Sodium-free light mayonnaise. Fresh or refrigerated horseradish. Lemon juice. Vinegar. Homemade, reduced-sodium, or low-sodium soups. Unsalted popcorn and pretzels. Low-salt or salt-free chips. What foods are not recommended? The items listed may not be a complete list. Talk with your dietitian about what dietary choices are best for you. Grains Instant hot cereals. Bread stuffing, pancake, and biscuit mixes. Croutons. Seasoned rice or pasta mixes. Noodle soup cups. Boxed or frozen macaroni and cheese. Regular salted crackers. Self-rising flour. Vegetables Sauerkraut, pickled vegetables, and relishes. Olives. [place] fries. Onion rings. Regular canned vegetables (not low-sodium or reduced-sodium). Regular canned tomato sauce and paste (not low-sodium or reduced-sodium). Regular tomato and vegetable juice (not low-sodium or reduced-sodium). Frozen vegetables in sauces. Meats and other protein foods Meat or fish that is salted, canned, smoked, spiced, or pickled. Bacon, ham, sausage, hotdogs, corned beef, chipped beef, packaged lunch meats, salt pork, jerky, pickled herring, anchovies, regular canned tuna, sardines, salted nuts. Dairy Processed cheese and cheese spreads. Cheese curds. Blue cheese. Feta cheese. String cheese. Regular cottage  cheese. Buttermilk. Canned milk. Fats and oils Salted butter. Regular margarine. Ghee. Bacon fat. Seasonings and other foods Onion salt, garlic salt, seasoned salt, table salt, and sea salt. Canned and packaged gravies. Worcestershire sauce. Tartar sauce. Barbecue sauce. Teriyaki sauce. Soy sauce, including reduced-sodium. Steak sauce. Fish sauce. Oyster sauce. Cocktail sauce. Horseradish that you find on the shelf. Regular ketchup and mustard. Meat flavorings and tenderizers. Bouillon cubes. Hot sauce and Tabasco sauce. Premade or packaged marinades. Premade or packaged taco seasonings. Relishes. Regular salad dressings. Salsa. Potato and tortilla chips. Corn chips and puffs. Salted popcorn and pretzels. Canned or dried soups. Pizza. Frozen entrees and pot pies. Summary  Eating less sodium can help lower your blood pressure, reduce swelling, and protect your heart, liver, and kidneys.  Most people on this plan should limit their sodium intake to 1,500-2,000 mg (milligrams) of sodium each day.  Canned, boxed, and frozen foods are high in sodium. Restaurant foods, fast foods, and pizza are also very high in sodium. You also get sodium by adding salt to food.  Try to cook at home, eat more fresh fruits and vegetables, and eat less fast food, canned, processed, or prepared foods. This information is not intended to replace advice given to you by your health care provider. Make sure you discuss any questions you have with your health care provider. Document Released: 01/07/2002 Document Revised: 07/11/2016 Document Reviewed: 07/11/2016 Elsevier Interactive Patient Education  2018 [place], Ermalinda Barrios, [place]  04/23/2018 2:12 PM    [facility] [place], [place], [place]  [address] Phone: [phone]; Fax: [phone]    

## 2018-04-23 ENCOUNTER — Ambulatory Visit (INDEPENDENT_AMBULATORY_CARE_PROVIDER_SITE_OTHER): Payer: Medicaid Other | Admitting: Physician Assistant

## 2018-04-23 ENCOUNTER — Encounter: Payer: Self-pay | Admitting: Physician Assistant

## 2018-04-23 VITALS — BP 160/90 | HR 66 | Ht 67.0 in | Wt 157.8 lb

## 2018-04-23 DIAGNOSIS — E782 Mixed hyperlipidemia: Secondary | ICD-10-CM | POA: Diagnosis not present

## 2018-04-23 DIAGNOSIS — I2510 Atherosclerotic heart disease of native coronary artery without angina pectoris: Secondary | ICD-10-CM | POA: Diagnosis not present

## 2018-04-23 DIAGNOSIS — I10 Essential (primary) hypertension: Secondary | ICD-10-CM

## 2018-04-23 DIAGNOSIS — M7918 Myalgia, other site: Secondary | ICD-10-CM | POA: Diagnosis not present

## 2018-04-23 MED ORDER — METOPROLOL TARTRATE 25 MG PO TABS: 12.5000 mg | ORAL_TABLET | Freq: Two times a day (BID) | ORAL | 3 refills | Status: DC

## 2018-04-23 NOTE — Patient Instructions (Signed)
Medication Instructions:  Your physician has recommended you make the following change in your medication:  Take Lopressor 12.5 mg Two Times Daily    Labwork: NONE   Testing/Procedures: NONE   Follow-Up: Your physician recommends that you schedule a follow-up appointment with Dr. Bronson Ing.   Your physician recommends that you schedule a follow-up appointment with Dr. Legrand Rams for Blood Pressure Check next week.    Any Other Special Instructions Will Be Listed Below (If Applicable).  Your physician discussed the hazards of tobacco use. Tobacco use cessation is recommended and techniques and options to help you quit were discussed.      If you need a refill on your cardiac medications before your next appointment, please call your pharmacy. Thank you for choosing [facility]!   Low-Sodium Eating Plan Sodium, which is an element that makes up salt, helps you maintain a healthy balance of fluids in your body. Too much sodium can increase your blood pressure and cause fluid and waste to be held in your body. Your health care provider or dietitian may recommend following this plan if you have high blood pressure (hypertension), kidney disease, liver disease, or heart failure. Eating less sodium can help lower your blood pressure, reduce swelling, and protect your heart, liver, and kidneys. What are tips for following this plan? General guidelines  Most people on this plan should limit their sodium intake to 1,500-2,000 mg (milligrams) of sodium each day. Reading food labels  The Nutrition Facts label lists the amount of sodium in one serving of the food. If you eat more than one serving, you must multiply the listed amount of sodium by the number of servings.  Choose foods with less than 140 mg of sodium per serving.  Avoid foods with 300 mg of sodium or more per serving. Shopping  Look for lower-sodium products, often labeled as "low-sodium" or "no salt  added."  Always check the sodium content even if foods are labeled as "unsalted" or "no salt added".  Buy fresh foods. ? Avoid canned foods and premade or frozen meals. ? Avoid canned, cured, or processed meats  Buy breads that have less than 80 mg of sodium per slice. Cooking  Eat more home-cooked food and less restaurant, buffet, and fast food.  Avoid adding salt when cooking. Use salt-free seasonings or herbs instead of table salt or sea salt. Check with your health care provider or pharmacist before using salt substitutes.  Cook with plant-based oils, such as canola, sunflower, or olive oil. Meal planning  When eating at a restaurant, ask that your food be prepared with less salt or no salt, if possible.  Avoid foods that contain MSG (monosodium glutamate). MSG is sometimes added to [place] food, bouillon, and some canned foods. What foods are recommended? The items listed may not be a complete list. Talk with your dietitian about what dietary choices are best for you. Grains Low-sodium cereals, including oats, puffed wheat and rice, and shredded wheat. Low-sodium crackers. Unsalted rice. Unsalted pasta. Low-sodium bread. Whole-grain breads and whole-grain pasta. Vegetables Fresh or frozen vegetables. "No salt added" canned vegetables. "No salt added" tomato sauce and paste. Low-sodium or reduced-sodium tomato and vegetable juice. Fruits Fresh, frozen, or canned fruit. Fruit juice. Meats and other protein foods Fresh or frozen (no salt added) meat, poultry, seafood, and fish. Low-sodium canned tuna and salmon. Unsalted nuts. Dried peas, beans, and lentils without added salt. Unsalted canned beans. Eggs. Unsalted nut butters. Dairy Milk. Soy milk. Cheese that is  naturally low in sodium, such as ricotta cheese, fresh mozzarella, or Swiss cheese Low-sodium or reduced-sodium cheese. Cream cheese. Yogurt. Fats and oils Unsalted butter. Unsalted margarine with no trans fat. Vegetable  oils such as canola or olive oils. Seasonings and other foods Fresh and dried herbs and spices. Salt-free seasonings. Low-sodium mustard and ketchup. Sodium-free salad dressing. Sodium-free light mayonnaise. Fresh or refrigerated horseradish. Lemon juice. Vinegar. Homemade, reduced-sodium, or low-sodium soups. Unsalted popcorn and pretzels. Low-salt or salt-free chips. What foods are not recommended? The items listed may not be a complete list. Talk with your dietitian about what dietary choices are best for you. Grains Instant hot cereals. Bread stuffing, pancake, and biscuit mixes. Croutons. Seasoned rice or pasta mixes. Noodle soup cups. Boxed or frozen macaroni and cheese. Regular salted crackers. Self-rising flour. Vegetables Sauerkraut, pickled vegetables, and relishes. Olives. [place] fries. Onion rings. Regular canned vegetables (not low-sodium or reduced-sodium). Regular canned tomato sauce and paste (not low-sodium or reduced-sodium). Regular tomato and vegetable juice (not low-sodium or reduced-sodium). Frozen vegetables in sauces. Meats and other protein foods Meat or fish that is salted, canned, smoked, spiced, or pickled. Bacon, ham, sausage, hotdogs, corned beef, chipped beef, packaged lunch meats, salt pork, jerky, pickled herring, anchovies, regular canned tuna, sardines, salted nuts. Dairy Processed cheese and cheese spreads. Cheese curds. Blue cheese. Feta cheese. String cheese. Regular cottage cheese. Buttermilk. Canned milk. Fats and oils Salted butter. Regular margarine. Ghee. Bacon fat. Seasonings and other foods Onion salt, garlic salt, seasoned salt, table salt, and sea salt. Canned and packaged gravies. Worcestershire sauce. Tartar sauce. Barbecue sauce. Teriyaki sauce. Soy sauce, including reduced-sodium. Steak sauce. Fish sauce. Oyster sauce. Cocktail sauce. Horseradish that you find on the shelf. Regular ketchup and mustard. Meat flavorings and tenderizers. Bouillon cubes.  Hot sauce and Tabasco sauce. Premade or packaged marinades. Premade or packaged taco seasonings. Relishes. Regular salad dressings. Salsa. Potato and tortilla chips. Corn chips and puffs. Salted popcorn and pretzels. Canned or dried soups. Pizza. Frozen entrees and pot pies. Summary  Eating less sodium can help lower your blood pressure, reduce swelling, and protect your heart, liver, and kidneys.  Most people on this plan should limit their sodium intake to 1,500-2,000 mg (milligrams) of sodium each day.  Canned, boxed, and frozen foods are high in sodium. Restaurant foods, fast foods, and pizza are also very high in sodium. You also get sodium by adding salt to food.  Try to cook at home, eat more fresh fruits and vegetables, and eat less fast food, canned, processed, or prepared foods. This information is not intended to replace advice given to you by your health care provider. Make sure you discuss any questions you have with your health care provider. Document Released: 01/07/2002 Document Revised: 07/11/2016 Document Reviewed: 07/11/2016 Elsevier Interactive Patient Education  [employer].

## 2018-04-24 ENCOUNTER — Ambulatory Visit: Payer: Medicaid Other

## 2018-05-07 ENCOUNTER — Ambulatory Visit: Payer: Medicaid Other | Admitting: Cardiovascular Disease

## 2018-05-08 ENCOUNTER — Encounter: Payer: Self-pay | Admitting: Cardiovascular Disease

## 2018-06-09 ENCOUNTER — Emergency Department (HOSPITAL_COMMUNITY): Payer: Medicaid Other

## 2018-06-09 ENCOUNTER — Other Ambulatory Visit: Payer: Self-pay

## 2018-06-09 ENCOUNTER — Emergency Department (HOSPITAL_COMMUNITY)
Admission: EM | Admit: 2018-06-09 | Discharge: 2018-06-09 | Disposition: A | Discharge status: Home / Self Care | Payer: Medicaid Other | Attending: Emergency Medicine | Admitting: Emergency Medicine

## 2018-06-09 ENCOUNTER — Encounter (HOSPITAL_COMMUNITY): Payer: Self-pay | Admitting: Emergency Medicine

## 2018-06-09 DIAGNOSIS — G40909 Epilepsy, unspecified, not intractable, without status epilepticus: Secondary | ICD-10-CM

## 2018-06-09 DIAGNOSIS — F17210 Nicotine dependence, cigarettes, uncomplicated: Secondary | ICD-10-CM | POA: Insufficient documentation

## 2018-06-09 DIAGNOSIS — Z79899 Other long term (current) drug therapy: Secondary | ICD-10-CM | POA: Insufficient documentation

## 2018-06-09 DIAGNOSIS — I2510 Atherosclerotic heart disease of native coronary artery without angina pectoris: Secondary | ICD-10-CM | POA: Insufficient documentation

## 2018-06-09 DIAGNOSIS — Z794 Long term (current) use of insulin: Secondary | ICD-10-CM | POA: Insufficient documentation

## 2018-06-09 DIAGNOSIS — R519 Headache, unspecified: Secondary | ICD-10-CM

## 2018-06-09 DIAGNOSIS — Z951 Presence of aortocoronary bypass graft: Secondary | ICD-10-CM | POA: Insufficient documentation

## 2018-06-09 DIAGNOSIS — R51 Headache: Secondary | ICD-10-CM | POA: Diagnosis not present

## 2018-06-09 DIAGNOSIS — I10 Essential (primary) hypertension: Secondary | ICD-10-CM | POA: Insufficient documentation

## 2018-06-09 DIAGNOSIS — Z7902 Long term (current) use of antithrombotics/antiplatelets: Secondary | ICD-10-CM | POA: Diagnosis not present

## 2018-06-09 DIAGNOSIS — R569 Unspecified convulsions: Secondary | ICD-10-CM | POA: Diagnosis present

## 2018-06-09 DIAGNOSIS — E1140 Type 2 diabetes mellitus with diabetic neuropathy, unspecified: Secondary | ICD-10-CM | POA: Insufficient documentation

## 2018-06-09 DIAGNOSIS — I252 Old myocardial infarction: Secondary | ICD-10-CM | POA: Insufficient documentation

## 2018-06-09 LAB — RAPID URINE DRUG SCREEN, HOSP PERFORMED
Amphetamines: NOT DETECTED
Barbiturates: NOT DETECTED
Benzodiazepines: POSITIVE — AB
Cocaine: NOT DETECTED
OPIATES: NOT DETECTED
Tetrahydrocannabinol: POSITIVE — AB

## 2018-06-09 LAB — URINALYSIS, ROUTINE W REFLEX MICROSCOPIC
BACTERIA UA: NONE SEEN
BILIRUBIN URINE: NEGATIVE
Glucose, UA: NEGATIVE mg/dL
KETONES UR: NEGATIVE mg/dL
LEUKOCYTES UA: NEGATIVE
NITRITE: NEGATIVE
PROTEIN: 30 mg/dL — AB
Specific Gravity, Urine: 1.012 (ref 1.005–1.030)
pH: 6 (ref 5.0–8.0)

## 2018-06-09 LAB — BASIC METABOLIC PANEL
Anion gap: 6 (ref 5–15)
BUN: 25 mg/dL — AB (ref 6–20)
CALCIUM: 8.9 mg/dL (ref 8.9–10.3)
CO2: 27 mmol/L (ref 22–32)
CREATININE: 1.62 mg/dL — AB (ref 0.61–1.24)
Chloride: 106 mmol/L (ref 98–111)
GFR calc Af Amer: 54 mL/min — ABNORMAL LOW (ref >60)
GFR, EST NON AFRICAN AMERICAN: 47 mL/min — AB (ref >60)
Glucose, Bld: 112 mg/dL — ABNORMAL HIGH (ref 70–99)
POTASSIUM: 3.8 mmol/L (ref 3.5–5.1)
SODIUM: 139 mmol/L (ref 135–145)

## 2018-06-09 LAB — CBC
HCT: 34.7 % — ABNORMAL LOW (ref 39.0–52.0)
HEMOGLOBIN: 11.7 g/dL — AB (ref 13.0–17.0)
MCH: 31.5 pg (ref 26.0–34.0)
MCHC: 33.7 g/dL (ref 30.0–36.0)
MCV: 93.5 fL (ref 80.0–100.0)
PLATELETS: 163 10*3/uL (ref 150–400)
RBC: 3.71 MIL/uL — ABNORMAL LOW (ref 4.22–5.81)
RDW: 12.1 % (ref 11.5–15.5)
WBC: 4.6 10*3/uL (ref 4.0–10.5)
nRBC: 0 % (ref 0.0–0.2)

## 2018-06-09 LAB — VALPROIC ACID LEVEL: VALPROIC ACID LVL: 66 ug/mL (ref 50.0–100.0)

## 2018-06-09 LAB — CBG MONITORING, ED: GLUCOSE-CAPILLARY: 122 mg/dL — AB (ref 70–99)

## 2018-06-09 MED ORDER — OXYCODONE-ACETAMINOPHEN 5-325 MG PO TABS
1.0000 | ORAL_TABLET | Freq: Once | ORAL | Status: AC
  Administered 2018-06-09: 1 via ORAL
  Filled 2018-06-09: qty 1

## 2018-06-09 NOTE — Discharge Instructions (Addendum)
Take your usual prescriptions as previously directed.  Call your regular medical doctor and your Neurolgist on Monday to schedule a follow up appointment this week.  Return to the Emergency Department immediately sooner if worsening.

## 2018-06-09 NOTE — ED Triage Notes (Signed)
Pt reports an unwitnessed seizure this morning. Hx of seizures and is taking medication appropriately. Pt c/o HA and bilateral pedal edema.

## 2018-06-09 NOTE — ED Notes (Signed)
Tolerated po fluids well.   

## 2018-06-09 NOTE — ED Notes (Signed)
Pt states he has a headache. RN notified.

## 2018-06-09 NOTE — ED Notes (Signed)
Given water for p.o. challenge.

## 2018-06-09 NOTE — ED Provider Notes (Signed)
[facility] EMERGENCY DEPARTMENT Provider Note   CSN: [id number] Arrival date & time: 06/09/18  1008     History   Chief Complaint Chief Complaint  Patient presents with  . Seizures    HPI Melvin Taylor is a 52 y.o. male.  HPI Pt was seen at 1135. Per pt, c/o unknown onset and resolution of one episode of "seizure" that may have occurred overnight last night. Pt states he "woke up on the floor" from laying in bed approximately 0200. Pt states that "is how I know I had a seizure." Pt states he has been compliant with his meds, with LD keppra and depakote this morning at 0800. Pt states he has not missed any doses. Endorses "being under a lot of stress" due to a family member's passing, that "is probably why I had a seizure." Pt now c/o acute flair of his chronic headache.  Describes the headache as per his usual chronic headache pain pattern.  Denies headache was sudden or maximal in onset or at any time.  Denies visual changes, no focal motor weakness, no tingling/numbness in extremities, no fevers, no neck pain, no rash, no CP/SOB, no abd pain, no N/V/D.     Past Medical History:  Diagnosis Date  . Anxiety   . Bipolar 1 disorder ([place])   . Cervical radiculopathy   . Chest pain   . Chronic back pain   . Chronic chest wall pain   . Chronic neck pain   . Coronary artery disease   . Depression   . Diabetes mellitus    Type II  . Hallucinations    "long history of them"  . Headache(784.0)   . History of gout   . HTN (hypertension)   . Insomnia   . Neuropathy   . Pain management   . Polysubstance abuse ([place])   . Right leg pain    chronic  . Schizophrenia ([place])   . Seizures ([place])    last sz between July 5-9th, 2016; epilepsy  . Shortness of breath dyspnea    with exertion  . Sleep apnea   . Transfusion of blood product refused for religious reason     Patient Active Problem List   Diagnosis Date Noted  . CAD (coronary artery disease) 03/28/2016  . Chest pain  03/21/2016  . Hyperlipidemia 03/11/2016  . Ischemic chest pain ([place])   . NSTEMI (non-ST elevated myocardial infarction) ([place])   . Coronary atherosclerosis of native coronary artery 03/03/2016  . Cervical spinal stenosis 12/14/2015  . Loss of weight 08/25/2015  . Essential hypertension, benign 07/23/2015  . Personal history of noncompliance with medical treatment, presenting hazards to health 07/23/2015  . Abdominal pain 06/08/2015  . Constipation 06/08/2015  . History of colonic polyps   . Diverticulosis of colon without hemorrhage   . Mucosal abnormality of stomach   . Encounter for screening colonoscopy 04/16/2015  . Dysphagia 04/16/2015  . Partial symptomatic epilepsy with complex partial seizures, intractable, without status epilepticus ([place]) 11/10/2014  . DM type 2 causing vascular disease ([place]) 06/29/2014  . Musculoskeletal pain 06/29/2014  . Hyperglycemia without ketosis 09/07/2013  . 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 Knee pain 12/28/2010  . Medial meniscus, posterior horn derangement 12/28/2010    Past Surgical History:  Procedure Laterality Date  . ANTERIOR CERVICAL DECOMP/DISCECTOMY FUSION N/A 12/14/2015   Procedure: ANTERIOR CERVICAL DECOMPRESSION/DISCECTOMY FUSION CERVICAL FIVE -SIX;  Surgeon: Earnie Larsson, MD;  Location: [place] [facility];  Service: Neurosurgery;  Laterality: N/A;  . BIOPSY  05/07/2015   Procedure: BIOPSY (Gastric);  Surgeon: [name], MD;  Location: [facility];  Service: Endoscopy;;  . CARDIAC CATHETERIZATION N/A 03/07/2016   Procedure: Left Heart Cath and Coronary Angiography;  Surgeon: Belva Crome, MD;  Location: [place] [facility];  Service: Cardiovascular;  Laterality: N/A;  . COLONOSCOPY WITH PROPOFOL N/A 05/07/2015   [id number] diverticulosis, multiple colon polyps removed, tubular adenoma, serrated colon polyp. Next colonoscopy October 2019  . CORONARY ARTERY BYPASS GRAFT N/A 03/28/2016   Procedure: CORONARY ARTERY BYPASS  GRAFTING (CABG) x 5 USING GREATER SAPHENOUS VEIN;  Surgeon: [name], MD;  Location: [place] OR;  Service: Open Heart Surgery;  Laterality: N/A;  . ENDOVEIN HARVEST OF GREATER SAPHENOUS VEIN Right 03/28/2016   Procedure: ENDOVEIN HARVEST OF GREATER SAPHENOUS VEIN;  Surgeon: [name], MD;  Location: [place];  Service: Open Heart Surgery;  Laterality: Right;  . ESOPHAGEAL DILATION N/A 05/07/2015   Procedure: ESOPHAGEAL DILATION WITH 56FR MALONEY DILATOR;  Surgeon: [name], MD;  Location: [facility];  Service: Endoscopy;  Laterality: N/A;  . ESOPHAGOGASTRODUODENOSCOPY (EGD) WITH PROPOFOL N/A 05/07/2015   RMR: Status post dilation of normal esophagus. Gastritis.  Marland Kitchen KNEE SURGERY Left    arthroscopy  . MANDIBLE FRACTURE SURGERY    . POLYPECTOMY  05/07/2015   Procedure: POLYPECTOMY (Hepatic Flexure, Distal Transverse Colon, Rectal);  Surgeon: [name], MD;  Location: [facility];  Service: Endoscopy;;  . TEE WITHOUT CARDIOVERSION N/A 03/28/2016   Procedure: TRANSESOPHAGEAL ECHOCARDIOGRAM (TEE);  Surgeon: [name], MD;  Location: [place];  Service: Open Heart Surgery;  Laterality: N/A;  . TONSILLECTOMY          Home Medications    Prior to Admission medications   Medication Sig Start Date End Date Taking? Authorizing Provider  ACCU-CHEK AVIVA PLUS test strip USE AS DIRECTED TO TEST 4 TIMES DAILY. 07/28/17   Cassandria Anger, MD  albuterol (PROVENTIL HFA;VENTOLIN HFA) 108 (90 Base) MCG/ACT inhaler Inhale 1-2 puffs into the lungs every 6 (six) hours as needed for wheezing or shortness of breath.    [provider]  amLODipine (NORVASC) 10 MG tablet TAKE 1 TABLET BY MOUTH ONCE DAILY. 07/07/17   Herminio Commons, MD  clopidogrel (PLAVIX) 75 MG tablet Take 1 tablet (75 mg total) by mouth daily. 04/18/16   Lendon Colonel, NP  diazepam (VALIUM) 5 MG tablet Take 5 mg by mouth every 6 (six) hours as needed for anxiety.    [provider]  divalproex (DEPAKOTE) 500 MG  DR tablet Take 3 tablets (1,500 mg total) by mouth 2 (two) times daily. 04/11/18   Penumalli, Earlean Polka, MD  Doxepin HCl 5 % CREA Apply 2 g topically 3 (three) times daily. Pt applies to chest and legs.    [provider]  Insulin Glargine (LANTUS SOLOSTAR) 100 UNIT/ML Solostar Pen Inject 30 Units into the skin at bedtime. Patient taking differently: Inject 30 Units into the skin at bedtime as needed (for high blood sugars if over 200).  03/03/17   Cassandria Anger, MD  levETIRAcetam (KEPPRA) 1000 MG tablet Take 1 tablet (1,000 mg total) by mouth 2 (two) times daily. 04/11/18   Penumalli, Earlean Polka, MD  levETIRAcetam (KEPPRA) 250 MG tablet Take 1 tablet (250 mg total) by mouth 2 (two) times daily. 04/11/18   Penumalli, Earlean Polka, MD  lidocaine (XYLOCAINE) 5 % ointment Apply 2-3 application topically 4 (four) times daily.  Pt applies to chest and legs.    [provider]  lisinopril (PRINIVIL,ZESTRIL) 5 MG tablet Take 1 tablet (5 mg total) by mouth daily. 08/31/17 04/04/18  Herminio Commons, MD  metoprolol tartrate (LOPRESSOR) 25 MG tablet Take 0.5 tablets (12.5 mg total) by mouth 2 (two) times daily. 04/23/18 07/22/18  Imogene Burn, PA-C  naproxen (NAPROSYN) 500 MG tablet Take 1 tablet (500 mg total) by mouth 2 (two) times daily. 08/17/17   Varney Biles, MD  NONFORMULARY OR COMPOUNDED ITEM APPLY 1 GRAM (1 PUMP) TO THE AFFECTED AREA(S) UP TO 4 TIMES A DAY                        DRUG NAME: B5 D3 K10 Pent+ 03/08/17   [provider]  oxyCODONE-acetaminophen (PERCOCET/ROXICET) 5-325 MG tablet Take 1 tablet by mouth every 6 (six) hours as needed. 04/04/18   Milton Ferguson, MD  RANEXA 500 MG 12 hr tablet TAKE (1) TABLET BY MOUTH TWICE DAILY. 05/31/17   Herminio Commons, MD    Family History Family History  Problem Relation Age of Onset  . Diabetes Father   . Hypertension Father   . Arthritis Unknown   . Diabetes Unknown   . Asthma Unknown   . Stroke Sister     Social  History Social History   Tobacco Use  . Smoking status: Current Every Day Smoker    Packs/day: 1.00    Years: 30.00    Pack years: 30.00    Types: Cigarettes  . Smokeless tobacco: Never Used  . Tobacco comment: 3 cigs per day  Substance Use Topics  . Alcohol use: No  . Drug use: No     Allergies   Aspirin   Review of Systems Review of Systems ROS: Statement: All systems negative except as marked or noted in the HPI; Constitutional: Negative for fever and chills. ; ; Eyes: Negative for eye pain, redness and discharge. ; ; ENMT: Negative for ear pain, hoarseness, nasal congestion, sinus pressure and sore throat. ; ; Cardiovascular: Negative for chest pain, palpitations, diaphoresis, dyspnea and peripheral edema. ; ; Respiratory: Negative for cough, wheezing and stridor. ; ; Gastrointestinal: Negative for nausea, vomiting, diarrhea, abdominal pain, blood in stool, hematemesis, jaundice and rectal bleeding. ; ; Genitourinary: Negative for dysuria, flank pain and hematuria. ; ; Musculoskeletal: Negative for back pain and neck pain. Negative for swelling and trauma.; ; Skin: Negative for pruritus, rash, abrasions, blisters, bruising and skin lesion.; ; Neuro: Negative for headache, lightheadedness and neck stiffness. Negative for weakness, altered level of consciousness, altered mental status, extremity weakness, paresthesias, +presumed seizure and chronic headache.        Physical Exam Updated Vital Signs BP 140/87 (BP Location: Right Arm)   Pulse (!) 52   Temp 97.9 F (36.6 C) (Oral)   Resp 16   Ht 5\' 7"  (1.702 m)   Wt 73.9 kg   SpO2 100%   BMI 25.53 kg/m   Physical Exam 1140: Physical examination:  Nursing notes reviewed; Vital signs and O2 SAT reviewed;  Constitutional: Well developed, Well nourished, Well hydrated, In no acute distress; Head:  Normocephalic, atraumatic; Eyes: EOMI, PERRL, No scleral icterus; ENMT: Mouth and pharynx normal, Mucous membranes moist; Neck:  Supple, Full range of motion, No lymphadenopathy; Cardiovascular: Regular rate and rhythm, No gallop; Respiratory: Breath sounds clear & equal bilaterally, No wheezes.  Speaking full sentences with ease, Normal respiratory effort/excursion; Chest: Nontender, Movement normal;  Abdomen: Soft, Nontender, Nondistended, Normal bowel sounds; Genitourinary: No CVA tenderness; Extremities: Peripheral pulses normal, No tenderness, No edema, No calf edema or asymmetry.; Neuro: AA&Ox3, Major CN grossly intact. No facial droop. Speech clear. No gross focal motor or sensory deficits in extremities.; Skin: Color normal, Warm, Dry.    ED Treatments / Results  Labs (all labs ordered are listed, but only abnormal results are displayed)   EKG None  Radiology   Procedures Procedures (including critical care time)  Medications Ordered in ED Medications  oxyCODONE-acetaminophen (PERCOCET/ROXICET) 5-325 MG per tablet 1 tablet (1 tablet Oral Given 06/09/18 1200)     Initial Impression / Assessment and Plan / ED Course  I have reviewed the triage vital signs and the nursing notes.  Pertinent labs & imaging results that were available during my care of the patient were reviewed by me and considered in my medical decision making (see chart for details).  MDM Reviewed: previous chart, nursing note and vitals Reviewed previous: labs Interpretation: labs, CT scan and x-ray    Results for orders placed or performed during the hospital encounter of 06/09/18  Valproic Acid (depakote) Level (if patient is taking this medication)  Result Value Ref Range   Valproic Acid Lvl 66 50.0 - 100.0 ug/mL  Basic metabolic panel - if new onset seizures  Result Value Ref Range   Sodium 139 135 - 145 mmol/L   Potassium 3.8 3.5 - 5.1 mmol/L   Chloride 106 98 - 111 mmol/L   CO2 27 22 - 32 mmol/L   Glucose, Bld 112 (H) 70 - 99 mg/dL   BUN 25 (H) 6 - 20 mg/dL   Creatinine, Ser 1.62 (H) 0.61 - 1.24 mg/dL   Calcium 8.9 8.9  - 10.3 mg/dL   GFR calc non Af Amer 47 (L) >60 mL/min   GFR calc Af Amer 54 (L) >60 mL/min   Anion gap 6 5 - 15  CBC - if new onset seizures  Result Value Ref Range   WBC 4.6 4.0 - 10.5 K/uL   RBC 3.71 (L) 4.22 - 5.81 MIL/uL   Hemoglobin 11.7 (L) 13.0 - 17.0 g/dL   HCT 34.7 (L) 39.0 - 52.0 %   MCV 93.5 80.0 - 100.0 fL   MCH 31.5 26.0 - 34.0 pg   MCHC 33.7 30.0 - 36.0 g/dL   RDW 12.1 11.5 - 15.5 %   Platelets 163 150 - 400 K/uL   nRBC 0.0 0.0 - 0.2 %  Urine rapid drug screen (hosp performed)  Result Value Ref Range   Opiates NONE DETECTED NONE DETECTED   Cocaine NONE DETECTED NONE DETECTED   Benzodiazepines POSITIVE (A) NONE DETECTED   Amphetamines NONE DETECTED NONE DETECTED   Tetrahydrocannabinol POSITIVE (A) NONE DETECTED   Barbiturates NONE DETECTED NONE DETECTED  Urinalysis, Routine w reflex microscopic  Result Value Ref Range   Color, Urine YELLOW YELLOW   APPearance CLEAR CLEAR   Specific Gravity, Urine 1.012 1.005 - 1.030   pH 6.0 5.0 - 8.0   Glucose, UA NEGATIVE NEGATIVE mg/dL   Hgb urine dipstick SMALL (A) NEGATIVE   Bilirubin Urine NEGATIVE NEGATIVE   Ketones, ur NEGATIVE NEGATIVE mg/dL   Protein, ur 30 (A) NEGATIVE mg/dL   Nitrite NEGATIVE NEGATIVE   Leukocytes, UA NEGATIVE NEGATIVE   RBC / HPF 6-10 0 - 5 RBC/hpf   WBC, UA 0-5 0 - 5 WBC/hpf   Bacteria, UA NONE SEEN NONE SEEN   Squamous Epithelial / LPF 0-5 0 -  5  CBG monitoring, ED  Result Value Ref Range   Glucose-Capillary 122 (H) 70 - 99 mg/dL   Dg Chest 2 View Result Date: 06/09/2018 CLINICAL DATA:  Seizure this morning.  Previous heart surgery. EXAM: CHEST - 2 VIEW COMPARISON:  04/04/2018 FINDINGS: Changes from a prior median sternotomy are stable. Cardiac silhouette is normal in size. No mediastinal or hilar masses. No evidence of adenopathy. Clear lungs.  No pleural effusion or pneumothorax. Skeletal structures are intact. IMPRESSION: No acute cardiopulmonary disease. Electronically Signed   By: Lajean Manes M.D.   On: 06/09/2018 13:25   Ct Head Wo Contrast Result Date: 06/09/2018 CLINICAL DATA:  Unwitnessed seizure this morning.  Headache. EXAM: CT HEAD WITHOUT CONTRAST CT CERVICAL SPINE WITHOUT CONTRAST TECHNIQUE: Multidetector CT imaging of the head and cervical spine was performed following the standard protocol without intravenous contrast. Multiplanar CT image reconstructions of the cervical spine were also generated. COMPARISON:  03/18/2018 FINDINGS: CT HEAD FINDINGS Brain: The brainstem, cerebellum, cerebral peduncles, thalami, basal ganglia, basilar cisterns, and ventricular system appear within normal limits. No intracranial hemorrhage, mass lesion, or acute CVA. Vascular: Mild atherosclerotic calcification of the cavernous carotid arteries. Skull: Unremarkable Sinuses/Orbits: Unremarkable There is potentially some soft tissue swelling in the vicinity of the glabella although the subcutaneous tissues are omitted from imaging. CT CERVICAL SPINE FINDINGS Alignment: Slight kyphotic angulation at C4-5 similar to prior, with 2 mm stable retrolisthesis at C5-6. Skull base and vertebrae: Anterior plate and screw fixator at C5-6 not appreciably changed from August. Interbody spacer noted. No cervical spine fracture or acute bony findings. Soft tissues and spinal canal: Unremarkable Disc levels: Borderline bilateral foraminal stenosis at C5-6 primarily from spurring. Upper chest: Unremarkable Other: No supplemental non-categorized findings. IMPRESSION: 1. No acute intracranial findings are acute cervical spine findings. 2. Mild atherosclerotic calcification of the cavernous carotid arteries. 3. Possible soft tissue swelling along the glabella, although this area was partially excluded. Correlate with visual inspection. 4. ACDF at C5-6, unchanged. Electronically Signed   By: Van Clines M.D.   On: 06/09/2018 15:26   Ct Cervical Spine Wo Contrast Result Date: 06/09/2018 CLINICAL DATA:  Unwitnessed  seizure this morning.  Headache. EXAM: CT HEAD WITHOUT CONTRAST CT CERVICAL SPINE WITHOUT CONTRAST TECHNIQUE: Multidetector CT imaging of the head and cervical spine was performed following the standard protocol without intravenous contrast. Multiplanar CT image reconstructions of the cervical spine were also generated. COMPARISON:  03/18/2018 FINDINGS: CT HEAD FINDINGS Brain: The brainstem, cerebellum, cerebral peduncles, thalami, basal ganglia, basilar cisterns, and ventricular system appear within normal limits. No intracranial hemorrhage, mass lesion, or acute CVA. Vascular: Mild atherosclerotic calcification of the cavernous carotid arteries. Skull: Unremarkable Sinuses/Orbits: Unremarkable There is potentially some soft tissue swelling in the vicinity of the glabella although the subcutaneous tissues are omitted from imaging. CT CERVICAL SPINE FINDINGS Alignment: Slight kyphotic angulation at C4-5 similar to prior, with 2 mm stable retrolisthesis at C5-6. Skull base and vertebrae: Anterior plate and screw fixator at C5-6 not appreciably changed from August. Interbody spacer noted. No cervical spine fracture or acute bony findings. Soft tissues and spinal canal: Unremarkable Disc levels: Borderline bilateral foraminal stenosis at C5-6 primarily from spurring. Upper chest: Unremarkable Other: No supplemental non-categorized findings. IMPRESSION: 1. No acute intracranial findings are acute cervical spine findings. 2. Mild atherosclerotic calcification of the cavernous carotid arteries. 3. Possible soft tissue swelling along the glabella, although this area was partially excluded. Correlate with visual inspection. 4. ACDF at C5-6, unchanged. Electronically Signed  By: Van Clines M.D.   On: 06/09/2018 15:26      1550:  Pt has tol PO well without N/V. Pt states he feels at his baseline and is ready to go home now. Depakote therapeutic and pt endorses compliance with keppra. Pt states "it's the stress  that caused the seizure;" pt will need to f/u with Neuro MD. Dx and testing d/w pt.  Questions answered.  Verb understanding, agreeable to d/c home with outpt f/u.       Final Clinical Impressions(s) / ED Diagnoses   Final diagnoses:  None    ED Discharge Orders    None       Francine Graven, DO 06/13/18 [id number]

## 2018-06-27 ENCOUNTER — Encounter: Payer: Self-pay | Admitting: Podiatry

## 2018-06-27 ENCOUNTER — Ambulatory Visit (INDEPENDENT_AMBULATORY_CARE_PROVIDER_SITE_OTHER): Payer: Medicaid Other | Admitting: Podiatry

## 2018-06-27 VITALS — BP 142/92

## 2018-06-27 DIAGNOSIS — L84 Corns and callosities: Secondary | ICD-10-CM | POA: Diagnosis not present

## 2018-06-27 DIAGNOSIS — E1142 Type 2 diabetes mellitus with diabetic polyneuropathy: Secondary | ICD-10-CM | POA: Diagnosis not present

## 2018-06-27 DIAGNOSIS — B351 Tinea unguium: Secondary | ICD-10-CM

## 2018-06-27 DIAGNOSIS — M79675 Pain in left toe(s): Secondary | ICD-10-CM | POA: Diagnosis not present

## 2018-06-27 DIAGNOSIS — M79674 Pain in right toe(s): Secondary | ICD-10-CM

## 2018-06-27 NOTE — Patient Instructions (Addendum)
Onychomycosis/Fungal Toenails  WHAT IS IT? An infection that lies within the keratin of your nail plate that is caused by a fungus.  WHY ME? Fungal infections affect all ages, sexes, races, and creeds.  There may be many factors that predispose you to a fungal infection such as age, coexisting medical conditions such as diabetes, or an autoimmune disease; stress, medications, fatigue, genetics, etc.  Bottom line: fungus thrives in a warm, moist environment and your shoes offer such a location.  IS IT CONTAGIOUS? Theoretically, yes.  You do not want to share shoes, nail clippers or files with someone who has fungal toenails.  Walking around barefoot in the same room or sleeping in the same bed is unlikely to transfer the organism.  It is important to realize, however, that fungus can spread easily from one nail to the next on the same foot.  HOW DO WE TREAT THIS?  There are several ways to treat this condition.  Treatment may depend on many factors such as age, medications, pregnancy, liver and kidney conditions, etc.  It is best to ask your doctor which options are available to you.  1. No treatment.   Unlike many other medical concerns, you can live with this condition.  However for many people this can be a painful condition and may lead to ingrown toenails or a bacterial infection.  It is recommended that you keep the nails cut short to help reduce the amount of fungal nail. 2. Topical treatment.  These range from herbal remedies to prescription strength nail lacquers.  About 40-50% effective, topicals require twice daily application for approximately 9 to 12 months or until an entirely new nail has grown out.  The most effective topicals are medical grade medications available through physicians offices. 3. Oral antifungal medications.  With an 80-90% cure rate, the most common oral medication requires 3 to 4 months of therapy and stays in your system for a year as the new nail grows out.  Oral  antifungal medications do require blood work to make sure it is a safe drug for you.  A liver function panel will be performed prior to starting the medication and after the first month of treatment.  It is important to have the blood work performed to avoid any harmful side effects.  In general, this medication safe but blood work is required. 4. Laser Therapy.  This treatment is performed by applying a specialized laser to the affected nail plate.  This therapy is noninvasive, fast, and non-painful.  It is not covered by insurance and is therefore, out of pocket.  The results have been very good with a 80-95% cure rate.  [facility] is the only practice in the area to offer this therapy. 5. Permanent Nail Avulsion.  Removing the entire nail so that a new nail will not grow back.  Diabetes and Foot Care Diabetes may cause you to have problems because of poor blood supply (circulation) to your feet and legs. This may cause the skin on your feet to become thinner, break easier, and heal more slowly. Your skin may become dry, and the skin may peel and crack. You may also have nerve damage in your legs and feet causing decreased feeling in them. You may not notice minor injuries to your feet that could lead to infections or more serious problems. Taking care of your feet is one of the most important things you can do for yourself. Follow these instructions at home:  Wear   shoes at all times, even in the house. Do not go barefoot. Bare feet are easily injured.  Check your feet daily for blisters, cuts, and redness. If you cannot see the bottom of your feet, use a mirror or ask someone for help.  Wash your feet with warm water (do not use hot water) and mild soap. Then pat your feet and the areas between your toes until they are completely dry. Do not soak your feet as this can dry your skin.  Apply a moisturizing lotion or petroleum jelly (that does not contain alcohol and is unscented) to the  skin on your feet and to dry, brittle toenails. Do not apply lotion between your toes.  Trim your toenails straight across. Do not dig under them or around the cuticle. File the edges of your nails with an emery board or nail file.  Do not cut corns or calluses or try to remove them with medicine.  Wear clean socks or stockings every day. Make sure they are not too tight. Do not wear knee-high stockings since they may decrease blood flow to your legs.  Wear shoes that fit properly and have enough cushioning. To break in new shoes, wear them for just a few hours a day. This prevents you from injuring your feet. Always look in your shoes before you put them on to be sure there are no objects inside.  Do not cross your legs. This may decrease the blood flow to your feet.  If you find a minor scrape, cut, or break in the skin on your feet, keep it and the skin around it clean and dry. These areas may be cleansed with mild soap and water. Do not cleanse the area with peroxide, alcohol, or iodine.  When you remove an adhesive bandage, be sure not to damage the skin around it.  If you have a wound, look at it several times a day to make sure it is healing.  Do not use heating pads or hot water bottles. They may burn your skin. If you have lost feeling in your feet or legs, you may not know it is happening until it is too late.  Make sure your health care provider performs a complete foot exam at least annually or more often if you have foot problems. Report any cuts, sores, or bruises to your health care provider immediately. Contact a health care provider if:  You have an injury that is not healing.  You have cuts or breaks in the skin.  You have an ingrown nail.  You notice redness on your legs or feet.  You feel burning or tingling in your legs or feet.  You have pain or cramps in your legs and feet.  Your legs or feet are numb.  Your feet always feel cold. Get help right away  if:  There is increasing redness, swelling, or pain in or around a wound.  There is a red line that goes up your leg.  Pus is coming from a wound.  You develop a fever or as directed by your health care provider.  You notice a bad smell coming from an ulcer or wound. This information is not intended to replace advice given to you by your health care provider. Make sure you discuss any questions you have with your health care provider. Document Released: 07/15/2000 Document Revised: 12/24/2015 Document Reviewed: 12/25/2012 Elsevier Interactive Patient Education  2017 [place].  Diabetic Neuropathy Diabetic neuropathy is a nerve disease or  nerve damage that is caused by diabetes mellitus. About half of all people with diabetes mellitus have some form of nerve damage. Nerve damage is more common in those who have had diabetes mellitus for many years and who generally have not had good control of their blood sugar (glucose) level. Diabetic neuropathy is a common complication of diabetes mellitus. There are three common types of diabetic neuropathy and a fourth type that is less common and less understood:  Peripheral neuropathy-This is the most common type of diabetic neuropathy. It causes damage to the nerves of the feet and legs first and then eventually the hands and arms. The damage affects the ability to sense touch.  Autonomic neuropathy-This type causes damage to the autonomic nervous system, which controls the following functions: ? Heartbeat. ? Body temperature. ? Blood pressure. ? Urination. ? Digestion. ? Sweating. ? Sexual function.  Focal neuropathy-Focal neuropathy can be painful and unpredictable and occurs most often in older adults with diabetes mellitus. It involves a specific nerve or one area and often comes on suddenly. It usually does not cause long-term problems.  Radiculoplexus neuropathy- Sometimes called lumbosacral radiculoplexus neuropathy, radiculoplexus  neuropathy affects the nerves of the thighs, hips, buttocks, or legs. It is more common in people with type 2 diabetes mellitus and in older men. It is characterized by debilitating pain, weakness, and atrophy, usually in the thigh muscles.  What are the causes? The cause of peripheral, autonomic, and focal neuropathies is diabetes mellitus that is uncontrolled and high glucose levels. The cause of radiculoplexus neuropathy is unknown. However, it is thought to be caused by inflammation related to uncontrolled glucose levels. What are the signs or symptoms? Peripheral Neuropathy Peripheral neuropathy develops slowly over time. When the nerves of the feet and legs no longer work there may be:  Burning, stabbing, or aching pain in the legs or feet.  Inability to feel pressure or pain in your feet. This can lead to: ? Thick calluses over pressure areas. ? Pressure sores. ? Ulcers.  Foot deformities.  Reduced ability to feel temperature changes.  Muscle weakness.  Autonomic Neuropathy The symptoms of autonomic neuropathy vary depending on which nerves are affected. Symptoms may include:  Problems with digestion, such as: ? Feeling sick to your stomach (nausea). ? Vomiting. ? Bloating. ? Constipation. ? Diarrhea. ? Abdominal pain.  Difficulty with urination. This occurs if you lose your ability to sense when your bladder is full. Problems include: ? Urine leakage (incontinence). ? Inability to empty your bladder completely (retention).  Rapid or irregular heartbeat (palpitations).  Blood pressure drops when you stand up (orthostatic hypotension). When you stand up you may feel: ? Dizzy. ? Weak. ? Faint.  In men, inability to attain and maintain an erection.  In women, vaginal dryness and problems with decreased sexual desire and arousal.  Problems with body temperature regulation.  Increased or decreased sweating.  Focal Neuropathy  Abnormal eye movements or abnormal  alignment of both eyes.  Weakness in the wrist.  Foot drop. This results in an inability to lift the foot properly and abnormal walking or foot movement.  Paralysis on one side of your face (Bell palsy).  Chest or abdominal pain. Radiculoplexus Neuropathy  Sudden, severe pain in your hip, thigh, or buttocks.  Weakness and wasting of thigh muscles.  Difficulty rising from a seated position.  Abdominal swelling.  Unexplained weight loss (usually more than 10 lb [4.5 kg]). How is this diagnosed? Peripheral Neuropathy Your senses may be  tested. Sensory function testing can be done with:  A light touch using a monofilament.  A vibration with tuning fork.  A sharp sensation with a pin prick.  Other tests that can help diagnose neuropathy are:  Nerve conduction velocity. This test checks the transmission of an electrical current through a nerve.  Electromyography. This shows how muscles respond to electrical signals transmitted by nearby nerves.  Quantitative sensory testing. This is used to assess how your nerves respond to vibrations and changes in temperature.  Autonomic Neuropathy Diagnosis is often based on reported symptoms. Tell your health care provider if you experience:  Dizziness.  Constipation.  Diarrhea.  Inappropriate urination or inability to urinate.  Inability to get or maintain an erection.  Tests that may be done include:  Electrocardiography or Holter monitor. These are tests that can help show problems with the heart rate or heart rhythm.  An X-ray exam may be done.  Focal Neuropathy Diagnosis is made based on your symptoms and what your health care provider finds during your exam. Other tests may be done. They may include:  Nerve conduction velocities. This checks the transmission of electrical current through a nerve.  Electromyography. This shows how muscles respond to electrical signals transmitted by nearby nerves.  Quantitative  sensory testing. This test is used to assess how your nerves respond to vibration and changes in temperature.  Radiculoplexus Neuropathy  Often the first thing is to eliminate any other issue or problems that might be the cause, as there is no standard test for diagnosis.  X-ray exam of your spine and lumbar region.  Spinal tap to rule out cancer.  MRI to rule out other lesions. How is this treated? Once nerve damage occurs, it cannot be reversed. The goal of treatment is to keep the disease or nerve damage from getting worse and affecting more nerve fibers. Controlling your blood glucose level is the key. Most people with radiculoplexus neuropathy see at least a partial improvement over time. You will need to keep your blood glucose and HbA1c levels in the target range determined by your health care provider. Things that help control blood glucose levels include:  Blood glucose monitoring.  Meal planning.  Physical activity.  Diabetes medicine.  Over time, maintaining lower blood glucose levels helps lessen symptoms. Sometimes, prescription pain medicine is needed. Follow these instructions at home:  Do not smoke.  Keep your blood glucose level in the range that you and your health care provider have determined acceptable for you.  Keep your blood pressure level in the range that you and your health care provider have determined acceptable for you.  Eat a well-balanced diet.  Be physically active every day. Include strength training and balance exercises.  Protect your feet. ? Check your feet every day for sores, cuts, blisters, or signs of infection. ? Wear padded socks and supportive shoes. Use orthotic inserts, if necessary. ? Regularly check the insides of your shoes for worn spots. Make sure there are no rocks or other items inside your shoes before you put them on. Contact a health care provider if:  You have burning, stabbing, or aching pain in the legs or  feet.  You are unable to feel pressure or pain in your feet.  You develop problems with digestion such as: ? Nausea. ? Vomiting. ? Bloating. ? Constipation. ? Diarrhea. ? Abdominal pain.  You have difficulty with urination, such as: ? Incontinence. ? Retention.  You have palpitations.  You develop orthostatic  hypotension. When you stand up you may feel: ? Dizzy. ? Weak. ? Faint.  You cannot attain and maintain an erection (in men).  You have vaginal dryness and problems with decreased sexual desire and arousal (in women).  You have severe pain in your thighs, legs, or buttocks.  You have unexplained weight loss. This information is not intended to replace advice given to you by your health care provider. Make sure you discuss any questions you have with your health care provider. Document Released: 09/26/2001 Document Revised: 12/24/2015 Document Reviewed: 12/27/2012 Elsevier Interactive Patient Education  2017 [employer].

## 2018-06-27 NOTE — Progress Notes (Signed)
Subjective: Melvin Taylor presents to clinic today for diabetic foot examination.  He was referred by Dr. Rosita Fire.  Today he relates painful, thick toenails 1-5 b/l that he cannot cut and which interfere with daily activities.  Pain is aggravated when wearing enclosed shoe gear.  He does have numbness in both feet.  He is on the blood thinner Plavix  Patient is also concerned about swelling in his legs and ankles.  He does relate having open heart surgery and utilizing a vein from his right lower extremity.  He would also like to inquire about obtaining diabetic shoes.  Past Medical History:  Diagnosis Date  . Anxiety   . Bipolar 1 disorder ([place])   . Cervical radiculopathy   . Chest pain   . Chronic back pain   . Chronic chest wall pain   . Chronic neck pain   . Coronary artery disease   . Depression   . Diabetes mellitus    Type II  . Hallucinations    "long history of them"  . Headache(784.0)   . History of gout   . HTN (hypertension)   . Insomnia   . Neuropathy   . Pain management   . Polysubstance abuse ([place])   . Right leg pain    chronic  . Schizophrenia ([place])   . Seizures ([place])    last sz between July 5-9th, 2016; epilepsy  . Shortness of breath dyspnea    with exertion  . Sleep apnea   . Transfusion of blood product refused for religious reason    Patient Active Problem List   Diagnosis Date Noted  . Other specified diseases of the digestive system 10/30/2017  . CAD (coronary artery disease) 03/28/2016  . Chest pain 03/21/2016  . Hyperlipidemia 03/11/2016  . Ischemic chest pain ([place])   . NSTEMI (non-ST elevated myocardial infarction) ([place])   . Coronary atherosclerosis of native coronary artery 03/03/2016  . Cervical spinal stenosis 12/14/2015  . Loss of weight 08/25/2015  . Essential hypertension, benign 07/23/2015  . Personal history of noncompliance with medical treatment, presenting hazards to health 07/23/2015  . Abdominal pain 06/08/2015  .  Constipation 06/08/2015  . History of colonic polyps   . Diverticulosis of colon without hemorrhage   . Mucosal abnormality of stomach   . Encounter for screening colonoscopy 04/16/2015  . Dysphagia 04/16/2015  . Partial symptomatic epilepsy with complex partial seizures, intractable, without status epilepticus ([place]) 11/10/2014  . Bipolar disorder, unspecified ([place]) 10/15/2014  . Schizophrenia ([place]) 10/15/2014  . DM type 2 causing vascular disease ([place]) 06/29/2014  . Musculoskeletal pain 06/29/2014  . Hyperglycemia without ketosis 09/07/2013  . 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 Other internal derangements of unspecified knee 01/13/2011  . Knee pain 12/28/2010  . Medial meniscus, posterior horn derangement 12/28/2010   Current Outpatient Medications:  .  ACCU-CHEK AVIVA PLUS test strip, USE AS DIRECTED TO TEST 4 TIMES DAILY., Disp: 150 each, Rfl: 5 .  albuterol (PROVENTIL HFA;VENTOLIN HFA) 108 (90 Base) MCG/ACT inhaler, Inhale 1-2 puffs into the lungs every 6 (six) hours as needed for wheezing or shortness of breath., Disp: , Rfl:  .  amLODipine (NORVASC) 10 MG tablet, TAKE 1 TABLET BY MOUTH ONCE DAILY., Disp: 90 tablet, Rfl: 1 .  clopidogrel (PLAVIX) 75 MG tablet, Take 1 tablet (75 mg total) by mouth daily., Disp: 90 tablet, Rfl: 2 .  diazepam (VALIUM) 5 MG tablet, Take 5 mg by mouth every 6 (six) hours as  needed for anxiety., Disp: , Rfl:  .  divalproex (DEPAKOTE) 500 MG DR tablet, Take 3 tablets (1,500 mg total) by mouth 2 (two) times daily., Disp: 540 tablet, Rfl: 4 .  Doxepin HCl 5 % CREA, Apply 2 g topically 3 (three) times daily. Pt applies to chest and legs., Disp: , Rfl:  .  Insulin Glargine (LANTUS SOLOSTAR) 100 UNIT/ML Solostar Pen, Inject 30 Units into the skin at bedtime. (Patient taking differently: Inject 30 Units into the skin at bedtime as needed (for high blood sugars if over 200). ), Disp: 15 mL, Rfl: 0 .  levETIRAcetam (KEPPRA) 1000 MG tablet, Take 1  tablet (1,000 mg total) by mouth 2 (two) times daily., Disp: 180 tablet, Rfl: 4 .  levETIRAcetam (KEPPRA) 250 MG tablet, Take 1 tablet (250 mg total) by mouth 2 (two) times daily., Disp: 180 tablet, Rfl: 4 .  lidocaine (XYLOCAINE) 5 % ointment, Apply 2-3 application topically 4 (four) times daily. Pt applies to chest and legs., Disp: , Rfl:  .  metoprolol tartrate (LOPRESSOR) 25 MG tablet, Take 0.5 tablets (12.5 mg total) by mouth 2 (two) times daily., Disp: 90 tablet, Rfl: 3 .  NONFORMULARY OR COMPOUNDED ITEM, APPLY 1 GRAM (1 PUMP) TO THE AFFECTED AREA(S) UP TO 4 TIMES A DAY                        DRUG NAME: [id number] [id number] Pent+, Disp: , Rfl: 5 .  oxyCODONE-acetaminophen (PERCOCET/ROXICET) 5-325 MG tablet, Take 1 tablet by mouth every 6 (six) hours as needed., Disp: 20 tablet, Rfl: 0 .  RANEXA 500 MG 12 hr tablet, TAKE (1) TABLET BY MOUTH TWICE DAILY., Disp: 60 tablet, Rfl: 6 .  lisinopril (PRINIVIL,ZESTRIL) 5 MG tablet, Take 1 tablet (5 mg total) by mouth daily., Disp: 30 tablet, Rfl: 6   Allergies  Allergen Reactions  . Aspirin Swelling and Other (See Comments)    Reaction:  Facial swelling    Social History   Occupational History  . Occupation: unemployed    Fish farm manager: NOT EMPLOYED  Tobacco Use  . Smoking status: Current Every Day Smoker    Packs/day: 1.00    Years: 30.00    Pack years: 30.00    Types: Cigarettes  . Smokeless tobacco: Never Used  . Tobacco comment: 3 cigs per day  Substance and Sexual Activity  . Alcohol use: No  . Drug use: No  . Sexual activity: Not Currently   Family History  Problem Relation Age of Onset  . Diabetes Father   . Hypertension Father   . Arthritis Unknown   . Diabetes Unknown   . Asthma Unknown   . Stroke Sister    Objective: Vitals:   06/27/18 0832  BP: (!) 142/92   Vascular Examination: Capillary refill time immediate to all 10 digits Presents pedis and posterior tibial pulses are palpable bilaterally  No digital hair present  bilaterally With varicosities present bilaterally Trace edema present bilaterally  Dermatological Examination: Skin with normal turgor texture and tone bilaterally  Toenails 1-5 b/l discolored, thick, dystrophic with subungual debris and pain with palpation to nailbeds due to thickness of nails.  Onycholysis noted left fifth digit.  Underlying nailbed intact with no signs of infection.  Keratotic lesion noted sub-met head 1 bilaterally plantar medial aspect of the hallux bilateral.  There is no erythema, no edema, no drainage associated with any of these lesions.  Musculoskeletal: Muscle strength 5/5 to all LE muscle groups  Hallux valgus with bunion deformity bilaterally  Neurological: Sensation intact with 10 gram monofilament. Vibratory sensation intact.  Assessment: 1.  Painful onychomycosis toenails 1-5 b/l  2.  Calluses sub-met head 1 bilaterally, plantar medial aspect of the hallux bilateral for total of 4 lesions 3.  Diabetes with neuropathy  Plan: 1. Discussed diabetic foot care principles.  Literature dispensed to patient on today. 2. Toenails 1-5 b/l were debrided in length and girth without iatrogenic bleeding. 3. Calluses debrided sub-met heads 1 bilaterally and plantar medial aspect of the hallux bilaterally for a total of 4 lesions. 4. Completed prescription for diabetic shoes given him a prescription for 1 pair of extra-depth shoes with one pair of heat moldable insoles.  He was given be addressed to [facility] to obtain his diabetic shoes and inserts.  In the meantime he is to continue to wear soft, supportive shoe gear daily 5. He was also given a prescription for 1 pair of knee-high compression hose 15 to 20 mmHg.  He is to obtain his from [facility]. 6. Patient to report any pedal injuries to medical professional immediately. 7. Follow up 3 months. Patient/POA to call should there be a concern in the interim.

## 2018-07-04 ENCOUNTER — Encounter: Payer: Self-pay | Admitting: Gastroenterology

## 2018-07-04 ENCOUNTER — Telehealth: Payer: Self-pay | Admitting: *Deleted

## 2018-07-04 ENCOUNTER — Ambulatory Visit (INDEPENDENT_AMBULATORY_CARE_PROVIDER_SITE_OTHER): Payer: Medicaid Other | Admitting: Gastroenterology

## 2018-07-04 ENCOUNTER — Encounter: Payer: Self-pay | Admitting: *Deleted

## 2018-07-04 ENCOUNTER — Other Ambulatory Visit: Payer: Self-pay | Admitting: *Deleted

## 2018-07-04 ENCOUNTER — Ambulatory Visit (INDEPENDENT_AMBULATORY_CARE_PROVIDER_SITE_OTHER): Payer: Medicaid Other | Admitting: Cardiovascular Disease

## 2018-07-04 VITALS — BP 146/90 | HR 52 | Ht 67.0 in | Wt 166.2 lb

## 2018-07-04 VITALS — BP 152/93 | HR 60 | Temp 97.2°F | Ht 67.0 in | Wt 162.4 lb

## 2018-07-04 DIAGNOSIS — D638 Anemia in other chronic diseases classified elsewhere: Secondary | ICD-10-CM

## 2018-07-04 DIAGNOSIS — M7989 Other specified soft tissue disorders: Secondary | ICD-10-CM

## 2018-07-04 DIAGNOSIS — R0789 Other chest pain: Secondary | ICD-10-CM | POA: Diagnosis not present

## 2018-07-04 DIAGNOSIS — Z86010 Personal history of colonic polyps: Secondary | ICD-10-CM | POA: Diagnosis not present

## 2018-07-04 DIAGNOSIS — E785 Hyperlipidemia, unspecified: Secondary | ICD-10-CM

## 2018-07-04 DIAGNOSIS — I25708 Atherosclerosis of coronary artery bypass graft(s), unspecified, with other forms of angina pectoris: Secondary | ICD-10-CM

## 2018-07-04 DIAGNOSIS — M7918 Myalgia, other site: Secondary | ICD-10-CM

## 2018-07-04 DIAGNOSIS — I10 Essential (primary) hypertension: Secondary | ICD-10-CM

## 2018-07-04 DIAGNOSIS — G8918 Other acute postprocedural pain: Secondary | ICD-10-CM

## 2018-07-04 DIAGNOSIS — Z720 Tobacco use: Secondary | ICD-10-CM

## 2018-07-04 MED ORDER — PEG 3350-KCL-NA BICARB-NACL 420 G PO SOLR: 4000.0000 mL | Freq: Once | ORAL | 0 refills | Status: AC

## 2018-07-04 MED ORDER — LISINOPRIL 10 MG PO TABS: 10.0000 mg | ORAL_TABLET | Freq: Every day | ORAL | 3 refills | Status: DC

## 2018-07-04 MED ORDER — ATORVASTATIN CALCIUM 40 MG PO TABS: 40.0000 mg | ORAL_TABLET | Freq: Every day | ORAL | 3 refills | Status: DC

## 2018-07-04 NOTE — Telephone Encounter (Signed)
Pre-op appt scheduled for 07/30/18 at 2:45pm. Letter mailed. LMOVM.

## 2018-07-04 NOTE — Patient Instructions (Signed)
Medication Instructions:  Increase lisinopril to 10 mg daily   Resume taking lipitor 40 mg daily   Labwork: I will request a copy of  lipids from PCP  Testing/Procedures: NONE  Follow-Up: Your physician wants you to follow-up in: 1 YEAR.  You will receive a reminder letter in the mail two months in advance. If you don't receive a letter, please call our office to schedule the follow-up appointment.   Any Other Special Instructions Will Be Listed Below (If Applicable).     If you need a refill on your cardiac medications before your next appointment, please call your pharmacy.

## 2018-07-04 NOTE — Progress Notes (Signed)
Primary Care Physician:  Rosita Fire, MD  Primary Gastroenterologist:  Garfield Cornea, MD   Chief Complaint  Patient presents with  . Consult    due for 3 yr.     HPI:  Melvin Taylor is a 52 y.o. male here to schedule 3 year surveillance colonoscopy.  His last colonoscopy and EGD were in October 2016, normal esophagus status post dilation, mild chronic gastritis and bulbar erosions.  Pancolonic diverticulosis, multiple colon polyps removed one was tubular adenoma, one was sessile pedunculated polyp.  Recommended 3-year surveillance.  Since we last saw him he had coronary artery bypass graft in August 2017.  Also had anterior cervical neck surgery in May 2017  Most recent urine drug screen 3 weeks ago positive for benzos and THC.  4 months ago urine drug screen also positive for cocaine.  Patient denies ongoing drug use except for marijuana.  No constipation, diarrhea, melena, brbpr, abdominal pain. No reflux, dysphagia, weight loss.   Current Outpatient Medications  Medication Sig Dispense Refill  . amLODipine (NORVASC) 10 MG tablet TAKE 1 TABLET BY MOUTH ONCE DAILY. 90 tablet 1  . atorvastatin (LIPITOR) 40 MG tablet Take 1 tablet (40 mg total) by mouth daily. 90 tablet 3  . diazepam (VALIUM) 5 MG tablet Take 5 mg by mouth 2 (two) times daily as needed for anxiety. Can take 2 tablets at bedtime    . divalproex (DEPAKOTE) 500 MG DR tablet Take 3 tablets (1,500 mg total) by mouth 2 (two) times daily. 540 tablet 4  . escitalopram (LEXAPRO) 10 MG tablet Take 10 mg by mouth daily.    . Insulin Glargine (LANTUS SOLOSTAR) 100 UNIT/ML Solostar Pen Inject 30 Units into the skin at bedtime. 15 mL 0  . lisinopril (PRINIVIL,ZESTRIL) 10 MG tablet Take 1 tablet (10 mg total) by mouth daily. 90 tablet 3  . metoprolol tartrate (LOPRESSOR) 25 MG tablet Take 0.5 tablets (12.5 mg total) by mouth 2 (two) times daily. 90 tablet 3  . oxyCODONE-acetaminophen (PERCOCET/ROXICET) 5-325 MG tablet Take 1 tablet  by mouth every 6 (six) hours as needed. 20 tablet 0  . paliperidone (INVEGA) 6 MG 24 hr tablet Take 12 mg by mouth daily.    Marland Kitchen RANEXA 500 MG 12 hr tablet TAKE (1) TABLET BY MOUTH TWICE DAILY. 60 tablet 6  . risperiDONE (RISPERDAL) 2 MG tablet Take 2 mg by mouth at bedtime.    Marland Kitchen ACCU-CHEK AVIVA PLUS test strip USE AS DIRECTED TO TEST 4 TIMES DAILY. 150 each 5  . albuterol (PROVENTIL HFA;VENTOLIN HFA) 108 (90 Base) MCG/ACT inhaler Inhale 1-2 puffs into the lungs every 6 (six) hours as needed for wheezing or shortness of breath.     No current facility-administered medications for this visit.     Allergies as of 07/04/2018 - Review Complete 07/04/2018  Allergen Reaction Noted  . Aspirin Swelling and Other (See Comments) 12/28/2010    Past Medical History:  Diagnosis Date  . Anxiety   . Bipolar 1 disorder ([place])   . Cervical radiculopathy   . Chest pain   . Chronic back pain   . Chronic chest wall pain   . Chronic neck pain   . Coronary artery disease   . Depression   . Diabetes mellitus    Type II  . Hallucinations    "long history of them"  . Headache(784.0)   . History of gout   . HTN (hypertension)   . Insomnia   . Neuropathy   .  Pain management   . Polysubstance abuse ([place])   . Right leg pain    chronic  . Schizophrenia ([place])   . Seizures ([place])    last sz between July 5-9th, 2016; epilepsy  . Shortness of breath dyspnea    with exertion  . Sleep apnea   . Transfusion of blood product refused for religious reason     Past Surgical History:  Procedure Laterality Date  . ANTERIOR CERVICAL DECOMP/DISCECTOMY FUSION N/A 12/14/2015   Procedure: ANTERIOR CERVICAL DECOMPRESSION/DISCECTOMY FUSION CERVICAL FIVE -SIX;  Surgeon: Earnie Larsson, MD;  Location: [facility];  Service: Neurosurgery;  Laterality: N/A;  . BIOPSY  05/07/2015   Procedure: BIOPSY (Gastric);  Surgeon: [name], MD;  Location: [facility];  Service: Endoscopy;;  . CARDIAC CATHETERIZATION N/A 03/07/2016    Procedure: Left Heart Cath and Coronary Angiography;  Surgeon: Belva Crome, MD;  Location: [facility];  Service: Cardiovascular;  Laterality: N/A;  . COLONOSCOPY WITH PROPOFOL N/A 05/07/2015   ERX:[id number] diverticulosis, multiple colon polyps removed, tubular adenoma, serrated colon polyp. Next colonoscopy October 2019  . CORONARY ARTERY BYPASS GRAFT N/A 03/28/2016   Procedure: CORONARY ARTERY BYPASS GRAFTING (CABG) x 5 USING GREATER SAPHENOUS VEIN;  Surgeon: [name], MD;  Location: [facility];  Service: Open Heart Surgery;  Laterality: N/A;  . ENDOVEIN HARVEST OF GREATER SAPHENOUS VEIN Right 03/28/2016   Procedure: ENDOVEIN HARVEST OF GREATER SAPHENOUS VEIN;  Surgeon: [name], MD;  Location: [facility];  Service: Open Heart Surgery;  Laterality: Right;  . ESOPHAGEAL DILATION N/A 05/07/2015   Procedure: ESOPHAGEAL DILATION WITH 56FR MALONEY DILATOR;  Surgeon: [name], MD;  [facility];  Service: Endoscopy;  Laterality: N/A;  . ESOPHAGOGASTRODUODENOSCOPY (EGD) WITH PROPOFOL N/A 05/07/2015   RMR: Status post dilation of normal esophagus. Gastritis.  Marland Kitchen KNEE SURGERY Left    arthroscopy  . MANDIBLE FRACTURE SURGERY    . POLYPECTOMY  05/07/2015   Procedure: POLYPECTOMY (Hepatic Flexure, Distal Transverse Colon, Rectal);  Surgeon: [name], MD;  Location: [facility];  Service: Endoscopy;;  . TEE WITHOUT CARDIOVERSION N/A 03/28/2016   Procedure: TRANSESOPHAGEAL ECHOCARDIOGRAM (TEE);  Surgeon: [name], MD;  Location: [facility];  Service: Open Heart Surgery;  Laterality: N/A;  . TONSILLECTOMY      Family History  Problem Relation Age of Onset  . Diabetes Father   . Hypertension Father   . Arthritis Unknown   . Diabetes Unknown   . Asthma Unknown   . Stroke Sister     Social History   Socioeconomic History  . Marital status: Single    Spouse name: Not on file  . Number of children: Not on file  . Years of education: 11th grade  . Highest education level: Not on  file  Occupational History  . Occupation: unemployed    Fish farm manager: NOT EMPLOYED  Social Needs  . Financial resource strain: Not on file  . Food insecurity:    Worry: Not on file    Inability: Not on file  . Transportation needs:    Medical: Not on file    Non-medical: Not on file  Tobacco Use  . Smoking status: Current Every Day Smoker    Packs/day: 1.00    Years: 30.00    Pack years: 30.00    Types: Cigarettes  . Smokeless tobacco: Never Used  . Tobacco comment: 3 cigs per day  Substance and Sexual Activity  . Alcohol use: No  .  Drug use: No  . Sexual activity: Not Currently  Lifestyle  . Physical activity:    Days per week: Not on file    Minutes per session: Not on file  . Stress: Not on file  Relationships  . Social connections:    Talks on phone: Not on file    Gets together: Not on file    Attends religious service: Not on file    Active member of club or organization: Not on file    Attends meetings of clubs or organizations: Not on file    Relationship status: Not on file  . Intimate partner violence:    Fear of current or ex partner: Not on file    Emotionally abused: Not on file    Physically abused: Not on file    Forced sexual activity: Not on file  Other Topics Concern  . Not on file  Social History Narrative   Lives alone   Drinks a cup of coffee a day      ROS:  General: Negative for anorexia, weight loss, fever, chills, fatigue, weakness. Eyes: Negative for vision changes.  ENT: Negative for hoarseness, difficulty swallowing , nasal congestion. CV: Negative for chest pain, angina, palpitations, dyspnea on exertion, peripheral edema.  Respiratory: Negative for dyspnea at rest, dyspnea on exertion, cough, sputum, wheezing.  GI: See history of present illness. GU:  Negative for dysuria, hematuria, urinary incontinence, urinary frequency, nocturnal urination.  MS: Negative for joint pain, low back pain.  Complains of some swelling in the right  lower extremity status post vein harvesting for CABG Derm: Negative for rash or itching.  Neuro: Negative for weakness, abnormal sensation, seizure, frequent headaches, memory loss, confusion.  Psych: Negative for anxiety, depression, suicidal ideation, hallucinations.  Endo: Negative for unusual weight change.  Heme: Negative for bruising or bleeding. Allergy: Negative for rash or hives.    Physical Examination:  BP (!) 152/93   Pulse 60   Temp (!) 97.2 F (36.2 C) (Oral)   Ht 5\' 7"  (1.702 m)   Wt 162 lb 6.4 oz (73.7 kg)   BMI 25.44 kg/m    General: Well-nourished, well-developed in no acute distress.  Head: Normocephalic, atraumatic.   Eyes: Conjunctiva pink, no icterus. Mouth: Oropharyngeal mucosa moist and pink , no lesions erythema or exudate. Neck: Supple without thyromegaly, masses, or lymphadenopathy.  Lungs: Clear to auscultation bilaterally.  Heart: Regular rate and rhythm, no murmurs rubs or gallops.  Abdomen: Bowel sounds are normal, nontender, nondistended, no hepatosplenomegaly or masses, no abdominal bruits or    hernia , no rebound or guarding.   Rectal: Not performed Extremities: No lower extremity edema. No clubbing or deformities.  Neuro: Alert and oriented x 4 , grossly normal neurologically.  Skin: Warm and dry, no rash or jaundice.   Psych: Alert and cooperative, normal mood and affect.  Labs: Lab Results  Component Value Date   CREATININE 1.62 (H) 06/09/2018   BUN 25 (H) 06/09/2018   NA 139 06/09/2018   K 3.8 06/09/2018   CL 106 06/09/2018   CO2 27 06/09/2018   Lab Results  Component Value Date   WBC 4.6 06/09/2018   HGB 11.7 (L) 06/09/2018   HCT 34.7 (L) 06/09/2018   MCV 93.5 06/09/2018   PLT 163 06/09/2018   Lab Results  Component Value Date   ALT 17 04/04/2018   AST 17 04/04/2018   ALKPHOS 41 04/04/2018   BILITOT 0.8 04/04/2018   No results found for: IRON,  TIBC, FERRITIN   Imaging Studies: Dg Chest 2 View  Result Date:  06/09/2018 CLINICAL DATA:  Seizure this morning.  Previous heart surgery. EXAM: CHEST - 2 VIEW COMPARISON:  04/04/2018 FINDINGS: Changes from a prior median sternotomy are stable. Cardiac silhouette is normal in size. No mediastinal or hilar masses. No evidence of adenopathy. Clear lungs.  No pleural effusion or pneumothorax. Skeletal structures are intact. IMPRESSION: No acute cardiopulmonary disease. Electronically Signed   By: Lajean Manes M.D.   On: 06/09/2018 13:25   Ct Head Wo Contrast  Result Date: 06/09/2018 CLINICAL DATA:  Unwitnessed seizure this morning.  Headache. EXAM: CT HEAD WITHOUT CONTRAST CT CERVICAL SPINE WITHOUT CONTRAST TECHNIQUE: Multidetector CT imaging of the head and cervical spine was performed following the standard protocol without intravenous contrast. Multiplanar CT image reconstructions of the cervical spine were also generated. COMPARISON:  03/18/2018 FINDINGS: CT HEAD FINDINGS Brain: The brainstem, cerebellum, cerebral peduncles, thalami, basal ganglia, basilar cisterns, and ventricular system appear within normal limits. No intracranial hemorrhage, mass lesion, or acute CVA. Vascular: Mild atherosclerotic calcification of the cavernous carotid arteries. Skull: Unremarkable Sinuses/Orbits: Unremarkable There is potentially some soft tissue swelling in the vicinity of the glabella although the subcutaneous tissues are omitted from imaging. CT CERVICAL SPINE FINDINGS Alignment: Slight kyphotic angulation at C4-5 similar to prior, with 2 mm stable retrolisthesis at C5-6. Skull base and vertebrae: Anterior plate and screw fixator at C5-6 not appreciably changed from August. Interbody spacer noted. No cervical spine fracture or acute bony findings. Soft tissues and spinal canal: Unremarkable Disc levels: Borderline bilateral foraminal stenosis at C5-6 primarily from spurring. Upper chest: Unremarkable Other: No supplemental non-categorized findings. IMPRESSION: 1. No acute  intracranial findings are acute cervical spine findings. 2. Mild atherosclerotic calcification of the cavernous carotid arteries. 3. Possible soft tissue swelling along the glabella, although this area was partially excluded. Correlate with visual inspection. 4. ACDF at C5-6, unchanged. Electronically Signed   By: Van Clines M.D.   On: 06/09/2018 15:26   Ct Cervical Spine Wo Contrast  Result Date: 06/09/2018 CLINICAL DATA:  Unwitnessed seizure this morning.  Headache. EXAM: CT HEAD WITHOUT CONTRAST CT CERVICAL SPINE WITHOUT CONTRAST TECHNIQUE: Multidetector CT imaging of the head and cervical spine was performed following the standard protocol without intravenous contrast. Multiplanar CT image reconstructions of the cervical spine were also generated. COMPARISON:  03/18/2018 FINDINGS: CT HEAD FINDINGS Brain: The brainstem, cerebellum, cerebral peduncles, thalami, basal ganglia, basilar cisterns, and ventricular system appear within normal limits. No intracranial hemorrhage, mass lesion, or acute CVA. Vascular: Mild atherosclerotic calcification of the cavernous carotid arteries. Skull: Unremarkable Sinuses/Orbits: Unremarkable There is potentially some soft tissue swelling in the vicinity of the glabella although the subcutaneous tissues are omitted from imaging. CT CERVICAL SPINE FINDINGS Alignment: Slight kyphotic angulation at C4-5 similar to prior, with 2 mm stable retrolisthesis at C5-6. Skull base and vertebrae: Anterior plate and screw fixator at C5-6 not appreciably changed from August. Interbody spacer noted. No cervical spine fracture or acute bony findings. Soft tissues and spinal canal: Unremarkable Disc levels: Borderline bilateral foraminal stenosis at C5-6 primarily from spurring. Upper chest: Unremarkable Other: No supplemental non-categorized findings. IMPRESSION: 1. No acute intracranial findings are acute cervical spine findings. 2. Mild atherosclerotic calcification of the cavernous  carotid arteries. 3. Possible soft tissue swelling along the glabella, although this area was partially excluded. Correlate with visual inspection. 4. ACDF at C5-6, unchanged. Electronically Signed   By: Van Clines M.D.   On: 06/09/2018  15:26     

## 2018-07-04 NOTE — Progress Notes (Signed)
SUBJECTIVE: The patient presents for follow-up of coronary artery disease. He underwent 5 vessel CABG on 03/28/16.  Ever since bypass surgery, he has struggled with musculoskeletal chest wall and left arm pain. It is exacerbated with movement and lifting heavy objects. He could not afford cardiac rehabilitation.  He underwent anterior cervical spine decompression surgery in May 2017. This was performed by Dr. Earnie Larsson.  He is still trying to get in with a pain management clinic.  He has had numerous ED visits for chest pain.   He has had some right leg swelling in the leg he had vein harvesting from.  His blood pressure is mildly elevated today.  I reviewed LFTs dated 04/04/2018 and they were normal.  He denies exertional chest pain.  He is smoking less than a pack of cigarettes daily.  He has occasional palpitations.    Review of Systems: As per "subjective", otherwise negative.  Allergies  Allergen Reactions  . Aspirin Swelling and Other (See Comments)    Reaction:  Facial swelling     Current Outpatient Medications  Medication Sig Dispense Refill  . ACCU-CHEK AVIVA PLUS test strip USE AS DIRECTED TO TEST 4 TIMES DAILY. 150 each 5  . albuterol (PROVENTIL HFA;VENTOLIN HFA) 108 (90 Base) MCG/ACT inhaler Inhale 1-2 puffs into the lungs every 6 (six) hours as needed for wheezing or shortness of breath.    Marland Kitchen amLODipine (NORVASC) 10 MG tablet TAKE 1 TABLET BY MOUTH ONCE DAILY. 90 tablet 1  . clopidogrel (PLAVIX) 75 MG tablet Take 1 tablet (75 mg total) by mouth daily. 90 tablet 2  . diazepam (VALIUM) 5 MG tablet Take 5 mg by mouth every 6 (six) hours as needed for anxiety.    . divalproex (DEPAKOTE) 500 MG DR tablet Take 3 tablets (1,500 mg total) by mouth 2 (two) times daily. 540 tablet 4  . Doxepin HCl 5 % CREA Apply 2 g topically 3 (three) times daily. Pt applies to chest and legs.    . Insulin Glargine (LANTUS SOLOSTAR) 100 UNIT/ML Solostar Pen Inject 30 Units into the skin at  bedtime. (Patient taking differently: Inject 30 Units into the skin at bedtime as needed (for high blood sugars if over 200). ) 15 mL 0  . levETIRAcetam (KEPPRA) 1000 MG tablet Take 1 tablet (1,000 mg total) by mouth 2 (two) times daily. 180 tablet 4  . levETIRAcetam (KEPPRA) 250 MG tablet Take 1 tablet (250 mg total) by mouth 2 (two) times daily. 180 tablet 4  . lidocaine (XYLOCAINE) 5 % ointment Apply 2-3 application topically 4 (four) times daily. Pt applies to chest and legs.    Marland Kitchen lisinopril (PRINIVIL,ZESTRIL) 5 MG tablet Take 1 tablet (5 mg total) by mouth daily. 30 tablet 6  . metoprolol tartrate (LOPRESSOR) 25 MG tablet Take 0.5 tablets (12.5 mg total) by mouth 2 (two) times daily. 90 tablet 3  . NONFORMULARY OR COMPOUNDED ITEM APPLY 1 GRAM (1 PUMP) TO THE AFFECTED AREA(S) UP TO 4 TIMES A DAY                        DRUG NAME: B5 D3 K10 Pent+  5  . oxyCODONE-acetaminophen (PERCOCET/ROXICET) 5-325 MG tablet Take 1 tablet by mouth every 6 (six) hours as needed. 20 tablet 0  . RANEXA 500 MG 12 hr tablet TAKE (1) TABLET BY MOUTH TWICE DAILY. 60 tablet 6   No current facility-administered medications for this visit.  Past Medical History:  Diagnosis Date  . Anxiety   . Bipolar 1 disorder ([place])   . Cervical radiculopathy   . Chest pain   . Chronic back pain   . Chronic chest wall pain   . Chronic neck pain   . Coronary artery disease   . Depression   . Diabetes mellitus    Type II  . Hallucinations    "long history of them"  . Headache(784.0)   . History of gout   . HTN (hypertension)   . Insomnia   . Neuropathy   . Pain management   . Polysubstance abuse ([place])   . Right leg pain    chronic  . Schizophrenia ([place])   . Seizures ([place])    last sz between July 5-9th, 2016; epilepsy  . Shortness of breath dyspnea    with exertion  . Sleep apnea   . Transfusion of blood product refused for religious reason     Past Surgical History:  Procedure Laterality Date  . ANTERIOR  CERVICAL DECOMP/DISCECTOMY FUSION N/A 12/14/2015   Procedure: ANTERIOR CERVICAL DECOMPRESSION/DISCECTOMY FUSION CERVICAL FIVE -SIX;  Surgeon: Earnie Larsson, MD;  Location: [facility];  Service: Neurosurgery;  Laterality: N/A;  . BIOPSY  05/07/2015   Procedure: BIOPSY (Gastric);  Surgeon: [name], MD;  [facility];  Service: Endoscopy;;  . CARDIAC CATHETERIZATION N/A 03/07/2016   Procedure: Left Heart Cath and Coronary Angiography;  Surgeon: Belva Crome, MD;  Location: [facility];  Service: Cardiovascular;  Laterality: N/A;  . COLONOSCOPY WITH PROPOFOL N/A 05/07/2015   EXH:[id number] diverticulosis, multiple colon polyps removed, tubular adenoma, serrated colon polyp. Next colonoscopy October 2019  . CORONARY ARTERY BYPASS GRAFT N/A 03/28/2016   Procedure: CORONARY ARTERY BYPASS GRAFTING (CABG) x 5 USING GREATER SAPHENOUS VEIN;  Surgeon: [name], MD;  Location: [facility];  Service: Open Heart Surgery;  Laterality: N/A;  . ENDOVEIN HARVEST OF GREATER SAPHENOUS VEIN Right 03/28/2016   Procedure: ENDOVEIN HARVEST OF GREATER SAPHENOUS VEIN;  Surgeon: [name], MD;  Location: [facility];  Service: Open Heart Surgery;  Laterality: Right;  . ESOPHAGEAL DILATION N/A 05/07/2015   Procedure: ESOPHAGEAL DILATION WITH 56FR MALONEY DILATOR;  Surgeon: [name], MD;  [facility];  Service: Endoscopy;  Laterality: N/A;  . ESOPHAGOGASTRODUODENOSCOPY (EGD) WITH PROPOFOL N/A 05/07/2015   RMR: Status post dilation of normal esophagus. Gastritis.  Marland Kitchen KNEE SURGERY Left    arthroscopy  . MANDIBLE FRACTURE SURGERY    . POLYPECTOMY  05/07/2015   Procedure: POLYPECTOMY (Hepatic Flexure, Distal Transverse Colon, Rectal);  Surgeon: [name], MD;  [facility];  Service: Endoscopy;;  . TEE WITHOUT CARDIOVERSION N/A 03/28/2016   Procedure: TRANSESOPHAGEAL ECHOCARDIOGRAM (TEE);  Surgeon: [name], MD;  Location: [facility];  Service: Open Heart Surgery;  Laterality: N/A;  . TONSILLECTOMY        Social History   Socioeconomic History  . Marital status: Single    Spouse name: Not on file  . Number of children: Not on file  . Years of education: 11th grade  . Highest education level: Not on file  Occupational History  . Occupation: unemployed    Fish farm manager: NOT EMPLOYED  Social Needs  . Financial resource strain: Not on file  . Food insecurity:    Worry: Not on file    Inability: Not on file  . Transportation needs:    Medical: Not on file    Non-medical: Not  on file  Tobacco Use  . Smoking status: Current Every Day Smoker    Packs/day: 1.00    Years: 30.00    Pack years: 30.00    Types: Cigarettes  . Smokeless tobacco: Never Used  . Tobacco comment: 3 cigs per day  Substance and Sexual Activity  . Alcohol use: No  . Drug use: No  . Sexual activity: Not Currently  Lifestyle  . Physical activity:    Days per week: Not on file    Minutes per session: Not on file  . Stress: Not on file  Relationships  . Social connections:    Talks on phone: Not on file    Gets together: Not on file    Attends religious service: Not on file    Active member of club or organization: Not on file    Attends meetings of clubs or organizations: Not on file    Relationship status: Not on file  . Intimate partner violence:    Fear of current or ex partner: Not on file    Emotionally abused: Not on file    Physically abused: Not on file    Forced sexual activity: Not on file  Other Topics Concern  . Not on file  Social History Narrative   Lives alone   Drinks a cup of coffee a day     Vitals:   07/04/18 0913  BP: (!) 146/90  Pulse: (!) 52  SpO2: 98%  Weight: 166 lb 3.2 oz (75.4 kg)  Height: 5\' 7"  (1.702 m)    Wt Readings from Last 3 Encounters:  07/04/18 166 lb 3.2 oz (75.4 kg)  06/09/18 163 lb (73.9 kg)  04/23/18 157 lb 12.8 oz (71.6 kg)     PHYSICAL EXAM General: NAD HEENT: Normal. Neck: No JVD, no thyromegaly. Lungs: Clear to auscultation bilaterally  with normal respiratory effort. CV: Bradycardic, regular rhythm, normal S1/S2, no S3/S4, no murmur. No pretibial or periankle edema.  No carotid bruit.   Abdomen: Soft, nontender, no distention.  Neurologic: Alert and oriented.  Psych: Normal affect. Skin: Normal. Musculoskeletal: No gross deformities.    ECG: Reviewed above under Subjective   Labs: Lab Results  Component Value Date/Time   K 3.8 06/09/2018 11:02 AM   BUN 25 (H) 06/09/2018 11:02 AM   BUN 19 11/17/2016 10:17 AM   CREATININE 1.62 (H) 06/09/2018 11:02 AM   CREATININE 0.99 03/17/2016 02:36 PM   ALT 17 04/04/2018 09:16 AM   TSH 0.58 10/19/2015 10:06 AM   HGB 11.7 (L) 06/09/2018 11:02 AM     Lipids: Lab Results  Component Value Date/Time   LDLCALC 53 03/22/2016 08:53 AM   CHOL 120 03/22/2016 08:53 AM   TRIG 37 03/22/2016 08:53 AM   HDL 60 03/22/2016 08:53 AM       ASSESSMENT AND PLAN:  1.  Coronary artery disease: S/p 5-v CABG 03/2016.Symptomatically stable.  Continue Plavix (allergic to ASA),metoprolol, and Ranexa.  Unclear why he is not on statin therapy.  I will restart atorvastatin 40 mg.  2. Hypertension:This remains elevated.    I will increase lisinopril to 10 mg daily.  3. Hyperlipidemia: Unclear why he is not on statin therapy.  I will restart atorvastatin 40 mg.  I will obtain a copy of lipids from PCP.  4. Musculoskeletal chest wall and left arm pain: He should follow-up with his PCP and pain management clinic.  5.  Tobacco use: Smokes less than a pack of cigarettes daily.  6.  Left leg swelling: This is due to vein harvesting for bypass surgery.  I recommended compression stockings.   Disposition: Follow up 1 year   Kate Sable, M.D., F.A.C.C.

## 2018-07-04 NOTE — H&P (View-Only) (Signed)
Primary Care Physician:  Rosita Fire, MD  Primary Gastroenterologist:  Garfield Cornea, MD   Chief Complaint  Patient presents with  . Consult    due for 3 yr.     HPI:  Melvin Taylor is a 52 y.o. male here to schedule 3 year surveillance colonoscopy.  His last colonoscopy and EGD were in October 2016, normal esophagus status post dilation, mild chronic gastritis and bulbar erosions.  Pancolonic diverticulosis, multiple colon polyps removed one was tubular adenoma, one was sessile pedunculated polyp.  Recommended 3-year surveillance.  Since we last saw him he had coronary artery bypass graft in August 2017.  Also had anterior cervical neck surgery in May 2017  Most recent urine drug screen 3 weeks ago positive for benzos and THC.  4 months ago urine drug screen also positive for cocaine.  Patient denies ongoing drug use except for marijuana.  No constipation, diarrhea, melena, brbpr, abdominal pain. No reflux, dysphagia, weight loss.   Current Outpatient Medications  Medication Sig Dispense Refill  . amLODipine (NORVASC) 10 MG tablet TAKE 1 TABLET BY MOUTH ONCE DAILY. 90 tablet 1  . atorvastatin (LIPITOR) 40 MG tablet Take 1 tablet (40 mg total) by mouth daily. 90 tablet 3  . diazepam (VALIUM) 5 MG tablet Take 5 mg by mouth 2 (two) times daily as needed for anxiety. Can take 2 tablets at bedtime    . divalproex (DEPAKOTE) 500 MG DR tablet Take 3 tablets (1,500 mg total) by mouth 2 (two) times daily. 540 tablet 4  . escitalopram (LEXAPRO) 10 MG tablet Take 10 mg by mouth daily.    . Insulin Glargine (LANTUS SOLOSTAR) 100 UNIT/ML Solostar Pen Inject 30 Units into the skin at bedtime. 15 mL 0  . lisinopril (PRINIVIL,ZESTRIL) 10 MG tablet Take 1 tablet (10 mg total) by mouth daily. 90 tablet 3  . metoprolol tartrate (LOPRESSOR) 25 MG tablet Take 0.5 tablets (12.5 mg total) by mouth 2 (two) times daily. 90 tablet 3  . oxyCODONE-acetaminophen (PERCOCET/ROXICET) 5-325 MG tablet Take 1 tablet  by mouth every 6 (six) hours as needed. 20 tablet 0  . paliperidone (INVEGA) 6 MG 24 hr tablet Take 12 mg by mouth daily.    Marland Kitchen RANEXA 500 MG 12 hr tablet TAKE (1) TABLET BY MOUTH TWICE DAILY. 60 tablet 6  . risperiDONE (RISPERDAL) 2 MG tablet Take 2 mg by mouth at bedtime.    Marland Kitchen ACCU-CHEK AVIVA PLUS test strip USE AS DIRECTED TO TEST 4 TIMES DAILY. 150 each 5  . albuterol (PROVENTIL HFA;VENTOLIN HFA) 108 (90 Base) MCG/ACT inhaler Inhale 1-2 puffs into the lungs every 6 (six) hours as needed for wheezing or shortness of breath.     No current facility-administered medications for this visit.     Allergies as of 07/04/2018 - Review Complete 07/04/2018  Allergen Reaction Noted  . Aspirin Swelling and Other (See Comments) 12/28/2010    Past Medical History:  Diagnosis Date  . Anxiety   . Bipolar 1 disorder ([facility])   . Cervical radiculopathy   . Chest pain   . Chronic back pain   . Chronic chest wall pain   . Chronic neck pain   . Coronary artery disease   . Depression   . Diabetes mellitus    Type II  . Hallucinations    "long history of them"  . Headache(784.0)   . History of gout   . HTN (hypertension)   . Insomnia   . Neuropathy   .  Pain management   . Polysubstance abuse ([place])   . Right leg pain    chronic  . Schizophrenia ([place])   . Seizures ([place])    last sz between July 5-9th, 2016; epilepsy  . Shortness of breath dyspnea    with exertion  . Sleep apnea   . Transfusion of blood product refused for religious reason     Past Surgical History:  Procedure Laterality Date  . ANTERIOR CERVICAL DECOMP/DISCECTOMY FUSION N/A 12/14/2015   Procedure: ANTERIOR CERVICAL DECOMPRESSION/DISCECTOMY FUSION CERVICAL FIVE -SIX;  Surgeon: Earnie Larsson, MD;  Location: [facility];  Service: Neurosurgery;  Laterality: N/A;  . BIOPSY  05/07/2015   Procedure: BIOPSY (Gastric);  Surgeon: [name], MD;  Location: [facility];  Service: Endoscopy;;  . CARDIAC CATHETERIZATION N/A 03/07/2016    Procedure: Left Heart Cath and Coronary Angiography;  Surgeon: Belva Crome, MD;  Location: [facility];  Service: Cardiovascular;  Laterality: N/A;  . COLONOSCOPY WITH PROPOFOL N/A 05/07/2015   [id number] diverticulosis, multiple colon polyps removed, tubular adenoma, serrated colon polyp. Next colonoscopy October 2019  . CORONARY ARTERY BYPASS GRAFT N/A 03/28/2016   Procedure: CORONARY ARTERY BYPASS GRAFTING (CABG) x 5 USING GREATER SAPHENOUS VEIN;  Surgeon: [name], MD;  Location: [facility];  Service: Open Heart Surgery;  Laterality: N/A;  . ENDOVEIN HARVEST OF GREATER SAPHENOUS VEIN Right 03/28/2016   Procedure: ENDOVEIN HARVEST OF GREATER SAPHENOUS VEIN;  Surgeon: [name], MD;  Location: [facility];  Service: Open Heart Surgery;  Laterality: Right;  . ESOPHAGEAL DILATION N/A 05/07/2015   Procedure: ESOPHAGEAL DILATION WITH 56FR MALONEY DILATOR;  Surgeon: [name], MD;  Location: [facility];  Service: Endoscopy;  Laterality: N/A;  . ESOPHAGOGASTRODUODENOSCOPY (EGD) WITH PROPOFOL N/A 05/07/2015   RMR: Status post dilation of normal esophagus. Gastritis.  Marland Kitchen KNEE SURGERY Left    arthroscopy  . MANDIBLE FRACTURE SURGERY    . POLYPECTOMY  05/07/2015   Procedure: POLYPECTOMY (Hepatic Flexure, Distal Transverse Colon, Rectal);  Surgeon: [name], MD;  Location: [facility];  Service: Endoscopy;;  . TEE WITHOUT CARDIOVERSION N/A 03/28/2016   Procedure: TRANSESOPHAGEAL ECHOCARDIOGRAM (TEE);  Surgeon: [name], MD;  Location: [facility];  Service: Open Heart Surgery;  Laterality: N/A;  . TONSILLECTOMY      Family History  Problem Relation Age of Onset  . Diabetes Father   . Hypertension Father   . Arthritis Unknown   . Diabetes Unknown   . Asthma Unknown   . Stroke Sister     Social History   Socioeconomic History  . Marital status: Single    Spouse name: Not on file  . Number of children: Not on file  . Years of education: 11th grade  . Highest education level: Not on  file  Occupational History  . Occupation: unemployed    Fish farm manager: NOT EMPLOYED  Social Needs  . Financial resource strain: Not on file  . Food insecurity:    Worry: Not on file    Inability: Not on file  . Transportation needs:    Medical: Not on file    Non-medical: Not on file  Tobacco Use  . Smoking status: Current Every Day Smoker    Packs/day: 1.00    Years: 30.00    Pack years: 30.00    Types: Cigarettes  . Smokeless tobacco: Never Used  . Tobacco comment: 3 cigs per day  Substance and Sexual Activity  . Alcohol use: No  .  Drug use: No  . Sexual activity: Not Currently  Lifestyle  . Physical activity:    Days per week: Not on file    Minutes per session: Not on file  . Stress: Not on file  Relationships  . Social connections:    Talks on phone: Not on file    Gets together: Not on file    Attends religious service: Not on file    Active member of club or organization: Not on file    Attends meetings of clubs or organizations: Not on file    Relationship status: Not on file  . Intimate partner violence:    Fear of current or ex partner: Not on file    Emotionally abused: Not on file    Physically abused: Not on file    Forced sexual activity: Not on file  Other Topics Concern  . Not on file  Social History Narrative   Lives alone   Drinks a cup of coffee a day      ROS:  General: Negative for anorexia, weight loss, fever, chills, fatigue, weakness. Eyes: Negative for vision changes.  ENT: Negative for hoarseness, difficulty swallowing , nasal congestion. CV: Negative for chest pain, angina, palpitations, dyspnea on exertion, peripheral edema.  Respiratory: Negative for dyspnea at rest, dyspnea on exertion, cough, sputum, wheezing.  GI: See history of present illness. GU:  Negative for dysuria, hematuria, urinary incontinence, urinary frequency, nocturnal urination.  MS: Negative for joint pain, low back pain.  Complains of some swelling in the right  lower extremity status post vein harvesting for CABG Derm: Negative for rash or itching.  Neuro: Negative for weakness, abnormal sensation, seizure, frequent headaches, memory loss, confusion.  Psych: Negative for anxiety, depression, suicidal ideation, hallucinations.  Endo: Negative for unusual weight change.  Heme: Negative for bruising or bleeding. Allergy: Negative for rash or hives.    Physical Examination:  BP (!) 152/93   Pulse 60   Temp (!) 97.2 F (36.2 C) (Oral)   Ht 5\' 7"  (1.702 m)   Wt 162 lb 6.4 oz (73.7 kg)   BMI 25.44 kg/m    General: Well-nourished, well-developed in no acute distress.  Head: Normocephalic, atraumatic.   Eyes: Conjunctiva pink, no icterus. Mouth: Oropharyngeal mucosa moist and pink , no lesions erythema or exudate. Neck: Supple without thyromegaly, masses, or lymphadenopathy.  Lungs: Clear to auscultation bilaterally.  Heart: Regular rate and rhythm, no murmurs rubs or gallops.  Abdomen: Bowel sounds are normal, nontender, nondistended, no hepatosplenomegaly or masses, no abdominal bruits or    hernia , no rebound or guarding.   Rectal: Not performed Extremities: No lower extremity edema. No clubbing or deformities.  Neuro: Alert and oriented x 4 , grossly normal neurologically.  Skin: Warm and dry, no rash or jaundice.   Psych: Alert and cooperative, normal mood and affect.  Labs: Lab Results  Component Value Date   CREATININE 1.62 (H) 06/09/2018   BUN 25 (H) 06/09/2018   NA 139 06/09/2018   K 3.8 06/09/2018   CL 106 06/09/2018   CO2 27 06/09/2018   Lab Results  Component Value Date   WBC 4.6 06/09/2018   HGB 11.7 (L) 06/09/2018   HCT 34.7 (L) 06/09/2018   MCV 93.5 06/09/2018   PLT 163 06/09/2018   Lab Results  Component Value Date   ALT 17 04/04/2018   AST 17 04/04/2018   ALKPHOS 41 04/04/2018   BILITOT 0.8 04/04/2018   No results found for: IRON,  TIBC, FERRITIN   Imaging Studies: Dg Chest 2 View  Result Date:  06/09/2018 CLINICAL DATA:  Seizure this morning.  Previous heart surgery. EXAM: CHEST - 2 VIEW COMPARISON:  04/04/2018 FINDINGS: Changes from a prior median sternotomy are stable. Cardiac silhouette is normal in size. No mediastinal or hilar masses. No evidence of adenopathy. Clear lungs.  No pleural effusion or pneumothorax. Skeletal structures are intact. IMPRESSION: No acute cardiopulmonary disease. Electronically Signed   By: Lajean Manes M.D.   On: 06/09/2018 13:25   Ct Head Wo Contrast  Result Date: 06/09/2018 CLINICAL DATA:  Unwitnessed seizure this morning.  Headache. EXAM: CT HEAD WITHOUT CONTRAST CT CERVICAL SPINE WITHOUT CONTRAST TECHNIQUE: Multidetector CT imaging of the head and cervical spine was performed following the standard protocol without intravenous contrast. Multiplanar CT image reconstructions of the cervical spine were also generated. COMPARISON:  03/18/2018 FINDINGS: CT HEAD FINDINGS Brain: The brainstem, cerebellum, cerebral peduncles, thalami, basal ganglia, basilar cisterns, and ventricular system appear within normal limits. No intracranial hemorrhage, mass lesion, or acute CVA. Vascular: Mild atherosclerotic calcification of the cavernous carotid arteries. Skull: Unremarkable Sinuses/Orbits: Unremarkable There is potentially some soft tissue swelling in the vicinity of the glabella although the subcutaneous tissues are omitted from imaging. CT CERVICAL SPINE FINDINGS Alignment: Slight kyphotic angulation at C4-5 similar to prior, with 2 mm stable retrolisthesis at C5-6. Skull base and vertebrae: Anterior plate and screw fixator at C5-6 not appreciably changed from August. Interbody spacer noted. No cervical spine fracture or acute bony findings. Soft tissues and spinal canal: Unremarkable Disc levels: Borderline bilateral foraminal stenosis at C5-6 primarily from spurring. Upper chest: Unremarkable Other: No supplemental non-categorized findings. IMPRESSION: 1. No acute  intracranial findings are acute cervical spine findings. 2. Mild atherosclerotic calcification of the cavernous carotid arteries. 3. Possible soft tissue swelling along the glabella, although this area was partially excluded. Correlate with visual inspection. 4. ACDF at C5-6, unchanged. Electronically Signed   By: Van Clines M.D.   On: 06/09/2018 15:26   Ct Cervical Spine Wo Contrast  Result Date: 06/09/2018 CLINICAL DATA:  Unwitnessed seizure this morning.  Headache. EXAM: CT HEAD WITHOUT CONTRAST CT CERVICAL SPINE WITHOUT CONTRAST TECHNIQUE: Multidetector CT imaging of the head and cervical spine was performed following the standard protocol without intravenous contrast. Multiplanar CT image reconstructions of the cervical spine were also generated. COMPARISON:  03/18/2018 FINDINGS: CT HEAD FINDINGS Brain: The brainstem, cerebellum, cerebral peduncles, thalami, basal ganglia, basilar cisterns, and ventricular system appear within normal limits. No intracranial hemorrhage, mass lesion, or acute CVA. Vascular: Mild atherosclerotic calcification of the cavernous carotid arteries. Skull: Unremarkable Sinuses/Orbits: Unremarkable There is potentially some soft tissue swelling in the vicinity of the glabella although the subcutaneous tissues are omitted from imaging. CT CERVICAL SPINE FINDINGS Alignment: Slight kyphotic angulation at C4-5 similar to prior, with 2 mm stable retrolisthesis at C5-6. Skull base and vertebrae: Anterior plate and screw fixator at C5-6 not appreciably changed from August. Interbody spacer noted. No cervical spine fracture or acute bony findings. Soft tissues and spinal canal: Unremarkable Disc levels: Borderline bilateral foraminal stenosis at C5-6 primarily from spurring. Upper chest: Unremarkable Other: No supplemental non-categorized findings. IMPRESSION: 1. No acute intracranial findings are acute cervical spine findings. 2. Mild atherosclerotic calcification of the cavernous  carotid arteries. 3. Possible soft tissue swelling along the glabella, although this area was partially excluded. Correlate with visual inspection. 4. ACDF at C5-6, unchanged. Electronically Signed   By: Van Clines M.D.   On: 06/09/2018  15:26     

## 2018-07-04 NOTE — Assessment & Plan Note (Signed)
52 year old gentleman due for 3-year surveillance colonoscopy for history of colon polyps.  Due to polypharmacy, will require deep sedation.  Denies ongoing cocaine use.  Last urine drug screen 3 weeks ago was negative for cocaine. Colonoscopy in the near future.  I have discussed the risks, alternatives, benefits with regards to but not limited to the risk of reaction to medication, bleeding, infection, perforation and the patient is agreeable to proceed. Written consent to be obtained.  He has mild normocytic anemia, last labs 1 month ago.  Recheck at preop.

## 2018-07-04 NOTE — Patient Instructions (Signed)
Colonoscopy as scheduled.  Please see separate instructions.

## 2018-07-05 ENCOUNTER — Telehealth: Payer: Self-pay

## 2018-07-05 DIAGNOSIS — E782 Mixed hyperlipidemia: Secondary | ICD-10-CM

## 2018-07-05 NOTE — Telephone Encounter (Signed)
Mailed lab slip to pt for lipids

## 2018-07-05 NOTE — Progress Notes (Signed)
CC'D TO PCP °

## 2018-07-17 ENCOUNTER — Ambulatory Visit: Payer: Self-pay | Admitting: Diagnostic Neuroimaging

## 2018-07-18 NOTE — Patient Instructions (Signed)
Melvin Taylor  07/18/2018     @PREFPERIOPPHARMACY @   Your procedure is scheduled on  08/02/2018 .  Report to [name] at  [place]   A.M.  Call this number if you have problems the morning of surgery:  [phone]   Remember:  Follow the diet and prep instructions given to you by Dr Roseanne Kaufman office.                    Take these medicines the morning of surgery with A SIP OF WATER  Amlodipine, valium( if needed), depakote, lexapro, lisinopril, metorpolol, oxycodone ( if needed), paliperidone, ranexa. Use your inhaler before you come. Take 1/2 of your usual night time insulin the night before your procedure.    Do not wear jewelry, make-up or nail polish.  Do not wear lotions, powders, or perfumes, or deodorant.  Do not shave 48 hours prior to surgery.  Men may shave face and neck.  Do not bring valuables to the hospital.  [facility] is not responsible for any belongings or valuables.  Contacts, dentures or bridgework may not be worn into surgery.  Leave your suitcase in the car.  After surgery it may be brought to your room.  For patients admitted to the hospital, discharge time will be determined by your treatment team.  Patients discharged the day of surgery will not be allowed to drive home.   Name and phone number of your driver:   family Special instructions:  DO NOT take any medications for diabetes the morning of your procedure.  Please read over the following fact sheets that you were given. Anesthesia Post-op Instructions and Care and Recovery After Surgery       Colonoscopy, Adult A colonoscopy is an exam to look at the large intestine. It is done to check for problems, such as:  Lumps (tumors).  Growths (polyps).  Swelling (inflammation).  Bleeding. What happens before the procedure? Eating and drinking Follow instructions from your doctor about eating and drinking. These instructions may include:  A few days before the procedure -  follow a low-fiber diet. ? Avoid nuts. ? Avoid seeds. ? Avoid dried fruit. ? Avoid raw fruits. ? Avoid vegetables.  1-3 days before the procedure - follow a clear liquid diet. Avoid liquids that have red or purple dye. Drink only clear liquids, such as: ? Clear broth or bouillon. ? Black coffee or tea. ? Clear juice. ? Clear soft drinks or sports drinks. ? Gelatin dessert. ? Popsicles.  On the day of the procedure - do not eat or drink anything during the 2 hours before the procedure. Up to 2 hours before the procedure, you may continue to drink clear liquids, such as water or clear fruit juice.  Bowel prep If you were prescribed an oral bowel prep:  Take it as told by your doctor. Starting the day before your procedure, you will need to drink a lot of liquid. The liquid will cause you to poop (have bowel movements) until your poop is almost clear or light green.  To clean out your colon, you may also be given: ? Laxative medicines. ? Instructions about how to use an enema.  If your skin or butt gets irritated from diarrhea, you may: ? Wipe the area with wipes that have medicine in them, such as adult wet wipes with aloe and vitamin E. ? Put something on your skin that soothes the area,  such as petroleum jelly.  If you throw up (vomit) while drinking the bowel prep, take a break for up to 60 minutes. Then begin the bowel prep again. If you keep throwing up and you cannot take the bowel prep without throwing up, call your doctor. General instructions  Ask your doctor about: ? Changing or stopping your normal medicines. This is important if you take iron pills, diabetes medicines, or blood thinners. ? Taking medicines such as aspirin and ibuprofen. These medicines can thin your blood. Do not take these medicines unless your doctor tells you to take them.  Plan to have someone take you home from the hospital or clinic. What happens during the procedure?   An IV tube may be put  into one of your veins.  You will be given medicine to help you relax (sedative).  To reduce your risk of infection: ? Your doctors will wash their hands. ? Your anal area will be washed with soap.  You will be asked to lie on your side with your knees bent.  Your doctor will get a long, thin, flexible tube ready. The tube will have a camera and a light on the end.  The tube will be put into your anus.  The tube will be gently put into your large intestine.  Air will be delivered into your large intestine to keep it open. You may feel some pressure or cramping.  The camera will be used to take photos.  A small tissue sample may be removed for testing (biopsy).  If small growths are found, your doctor may remove them and have them checked for cancer.  The tube that was put into your anus will be slowly removed. The procedure may vary among doctors and hospitals. What happens after the procedure?  Your doctor will check on you often until the medicines you were given have worn off.  Do not drive for 24 hours after the procedure.  You may have a small amount of blood in your poop.  You may pass gas.  You may have mild cramps or bloating in your belly (abdomen).  It is up to you to get the results of your procedure. Ask your doctor, or the department performing the procedure, when your results will be ready. Summary  A colonoscopy is an exam to look at the large intestine.  Follow instructions from your doctor about eating and drinking before the procedure.  If you were prescribed an oral bowel prep to clean out your colon, take it as told by your doctor.  Your doctor will check on you often until the medicines you were given have worn off.  Plan to have someone take you home from the hospital or clinic. This information is not intended to replace advice given to you by your health care provider. Make sure you discuss any questions you have with your health care  provider. Document Released: 08/20/2010 Document Revised: 05/17/2017 Document Reviewed: 09/29/2015 Elsevier Interactive Patient Education  2019 [employer]  Colonoscopy, Adult, Care After This sheet gives you information about how to care for yourself after your procedure. Your health care provider may also give you more specific instructions. If you have problems or questions, contact your health care provider. What can I expect after the procedure? After the procedure, it is common to have:  A small amount of blood in your stool for 24 hours after the procedure.  Some gas.  Mild abdominal cramping or bloating. Follow these instructions at home:  General instructions  For the first 24 hours after the procedure: ? Do not drive or use machinery. ? Do not sign important documents. ? Do not drink alcohol. ? Do your regular daily activities at a slower pace than normal. ? Eat soft, easy-to-digest foods.  Take over-the-counter or prescription medicines only as told by your health care provider. Relieving cramping and bloating   Try walking around when you have cramps or feel bloated.  Apply heat to your abdomen as told by your health care provider. Use a heat source that your health care provider recommends, such as a moist heat pack or a heating pad. ? Place a towel between your skin and the heat source. ? Leave the heat on for 20-30 minutes. ? Remove the heat if your skin turns bright red. This is especially important if you are unable to feel pain, heat, or cold. You may have a greater risk of getting burned. Eating and drinking   Drink enough fluid to keep your urine pale yellow.  Resume your normal diet as instructed by your health care provider. Avoid heavy or fried foods that are hard to digest.  Avoid drinking alcohol for as long as instructed by your health care provider. Contact a health care provider if:  You have blood in your stool 2-3 days after the  procedure. Get help right away if:  You have more than a small spotting of blood in your stool.  You pass large blood clots in your stool.  Your abdomen is swollen.  You have nausea or vomiting.  You have a fever.  You have increasing abdominal pain that is not relieved with medicine. Summary  After the procedure, it is common to have a small amount of blood in your stool. You may also have mild abdominal cramping and bloating.  For the first 24 hours after the procedure, do not drive or use machinery, sign important documents, or drink alcohol.  Contact your health care provider if you have a lot of blood in your stool, nausea or vomiting, a fever, or increased abdominal pain. This information is not intended to replace advice given to you by your health care provider. Make sure you discuss any questions you have with your health care provider. Document Released: 03/01/2004 Document Revised: 05/10/2017 Document Reviewed: 09/29/2015 Elsevier Interactive Patient Education  2019 [place] Anesthesia is a term that refers to techniques, procedures, and medicines that help a person stay safe and comfortable during a medical procedure. Monitored anesthesia care, or sedation, is one type of anesthesia. Your anesthesia specialist may recommend sedation if you will be having a procedure that does not require you to be unconscious, such as:  Cataract surgery.  A dental procedure.  A biopsy.  A colonoscopy. During the procedure, you may receive a medicine to help you relax (sedative). There are three levels of sedation:  Mild sedation. At this level, you may feel awake and relaxed. You will be able to follow directions.  Moderate sedation. At this level, you will be sleepy. You may not remember the procedure.  Deep sedation. At this level, you will be asleep. You will not remember the procedure. The more medicine you are given, the deeper your level of  sedation will be. Depending on how you respond to the procedure, the anesthesia specialist may change your level of sedation or the type of anesthesia to fit your needs. An anesthesia specialist will monitor you closely during the procedure. Let your  health care provider know about:  Any allergies you have.  All medicines you are taking, including vitamins, herbs, eye drops, creams, and over-the-counter medicines.  Any use of steroids (by mouth or as a cream).  Any problems you or family members have had with sedatives and anesthetic medicines.  Any blood disorders you have.  Any surgeries you have had.  Any medical conditions you have, such as sleep apnea.  Whether you are pregnant or may be pregnant.  Any use of cigarettes, alcohol, or street drugs. What are the risks? Generally, this is a safe procedure. However, problems may occur, including:  Getting too much medicine (oversedation).  Nausea.  Allergic reaction to medicines.  Trouble breathing. If this happens, a breathing tube may be used to help with breathing. It will be removed when you are awake and breathing on your own.  Heart trouble.  Lung trouble. Before the procedure Staying hydrated Follow instructions from your health care provider about hydration, which may include:  Up to 2 hours before the procedure - you may continue to drink clear liquids, such as water, clear fruit juice, black coffee, and plain tea. Eating and drinking restrictions Follow instructions from your health care provider about eating and drinking, which may include:  8 hours before the procedure - stop eating heavy meals or foods such as meat, fried foods, or fatty foods.  6 hours before the procedure - stop eating light meals or foods, such as toast or cereal.  6 hours before the procedure - stop drinking milk or drinks that contain milk.  2 hours before the procedure - stop drinking clear liquids. Medicines Ask your health care  provider about:  Changing or stopping your regular medicines. This is especially important if you are taking diabetes medicines or blood thinners.  Taking medicines such as aspirin and ibuprofen. These medicines can thin your blood. Do not take these medicines before your procedure if your health care provider instructs you not to. Tests and exams  You will have a physical exam.  You may have blood tests done to show: ? How well your kidneys and liver are working. ? How well your blood can clot. General instructions  Plan to have someone take you home from the hospital or clinic.  If you will be going home right after the procedure, plan to have someone with you for 24 hours.  What happens during the procedure?  Your blood pressure, heart rate, breathing, level of pain and overall condition will be monitored.  An IV tube will be inserted into one of your veins.  Your anesthesia specialist will give you medicines as needed to keep you comfortable during the procedure. This may mean changing the level of sedation.  The procedure will be performed. After the procedure  Your blood pressure, heart rate, breathing rate, and blood oxygen level will be monitored until the medicines you were given have worn off.  Do not drive for 24 hours if you received a sedative.  You may: ? Feel sleepy, clumsy, or nauseous. ? Feel forgetful about what happened after the procedure. ? Have a sore throat if you had a breathing tube during the procedure. ? Vomit. This information is not intended to replace advice given to you by your health care provider. Make sure you discuss any questions you have with your health care provider. Document Released: 04/13/2005 Document Revised: 12/25/2015 Document Reviewed: 11/08/2015 Elsevier Interactive Patient Education  2019 [place], Care After These instructions  provide you with information about caring for yourself after your  procedure. Your health care provider may also give you more specific instructions. Your treatment has been planned according to current medical practices, but problems sometimes occur. Call your health care provider if you have any problems or questions after your procedure. What can I expect after the procedure? After your procedure, you may:  Feel sleepy for several hours.  Feel clumsy and have poor balance for several hours.  Feel forgetful about what happened after the procedure.  Have poor judgment for several hours.  Feel nauseous or vomit.  Have a sore throat if you had a breathing tube during the procedure. Follow these instructions at home: For at least 24 hours after the procedure:      Have a responsible adult stay with you. It is important to have someone help care for you until you are awake and alert.  Rest as needed.  Do not: ? Participate in activities in which you could fall or become injured. ? Drive. ? Use heavy machinery. ? Drink alcohol. ? Take sleeping pills or medicines that cause drowsiness. ? Make important decisions or sign legal documents. ? Take care of children on your own. Eating and drinking  Follow the diet that is recommended by your health care provider.  If you vomit, drink water, juice, or soup when you can drink without vomiting.  Make sure you have little or no nausea before eating solid foods. General instructions  Take over-the-counter and prescription medicines only as told by your health care provider.  If you have sleep apnea, surgery and certain medicines can increase your risk for breathing problems. Follow instructions from your health care provider about wearing your sleep device: ? Anytime you are sleeping, including during daytime naps. ? While taking prescription pain medicines, sleeping medicines, or medicines that make you drowsy.  If you smoke, do not smoke without supervision.  Keep all follow-up visits as told by  your health care provider. This is important. Contact a health care provider if:  You keep feeling nauseous or you keep vomiting.  You feel light-headed.  You develop a rash.  You have a fever. Get help right away if:  You have trouble breathing. Summary  For several hours after your procedure, you may feel sleepy and have poor judgment.  Have a responsible adult stay with you for at least 24 hours or until you are awake and alert. This information is not intended to replace advice given to you by your health care provider. Make sure you discuss any questions you have with your health care provider. Document Released: 11/08/2015 Document Revised: 03/03/2017 Document Reviewed: 11/08/2015 Elsevier Interactive Patient Education  2019 [employer].

## 2018-07-20 ENCOUNTER — Other Ambulatory Visit: Payer: Self-pay | Admitting: Cardiovascular Disease

## 2018-07-21 LAB — LIPID PANEL W/O CHOL/HDL RATIO
Cholesterol, Total: 172 mg/dL (ref 100–199)
HDL: 78 mg/dL (ref >39)
LDL Calculated: 75 mg/dL (ref 0–99)
TRIGLYCERIDES: 93 mg/dL (ref 0–149)
VLDL Cholesterol Cal: 19 mg/dL (ref 5–40)

## 2018-07-21 LAB — SPECIMEN STATUS REPORT

## 2018-07-30 ENCOUNTER — Encounter (HOSPITAL_COMMUNITY)
Admission: RE | Admit: 2018-07-30 | Discharge: 2018-07-30 | Disposition: A | Discharge status: Home / Self Care | Payer: Medicaid Other | Source: Ambulatory Visit | Attending: Internal Medicine | Admitting: Internal Medicine

## 2018-07-30 ENCOUNTER — Encounter (HOSPITAL_COMMUNITY): Payer: Self-pay

## 2018-07-30 ENCOUNTER — Other Ambulatory Visit: Payer: Self-pay

## 2018-07-30 DIAGNOSIS — Z01812 Encounter for preprocedural laboratory examination: Secondary | ICD-10-CM | POA: Insufficient documentation

## 2018-07-30 LAB — RAPID URINE DRUG SCREEN, HOSP PERFORMED
Amphetamines: NOT DETECTED
BARBITURATES: NOT DETECTED
Benzodiazepines: NOT DETECTED
COCAINE: NOT DETECTED
Opiates: NOT DETECTED
Tetrahydrocannabinol: POSITIVE — AB

## 2018-07-31 ENCOUNTER — Encounter (HOSPITAL_COMMUNITY): Admission: RE | Admit: 2018-07-31 | Payer: Medicaid Other | Source: Ambulatory Visit

## 2018-07-31 HISTORY — DX: Acute myocardial infarction, unspecified: I21.9

## 2018-08-02 ENCOUNTER — Ambulatory Visit (HOSPITAL_COMMUNITY)
Admission: RE | Admit: 2018-08-02 | Discharge: 2018-08-02 | Disposition: A | Discharge status: Home / Self Care | Payer: Medicaid Other | Attending: Internal Medicine | Admitting: Internal Medicine

## 2018-08-02 ENCOUNTER — Ambulatory Visit (HOSPITAL_COMMUNITY): Payer: Medicaid Other | Admitting: Anesthesiology

## 2018-08-02 ENCOUNTER — Encounter (HOSPITAL_COMMUNITY): Payer: Self-pay

## 2018-08-02 ENCOUNTER — Encounter (HOSPITAL_COMMUNITY)
Admission: RE | Disposition: A | Discharge status: Home / Self Care | Payer: Self-pay | Source: Home / Self Care | Attending: Internal Medicine

## 2018-08-02 DIAGNOSIS — F419 Anxiety disorder, unspecified: Secondary | ICD-10-CM | POA: Diagnosis not present

## 2018-08-02 DIAGNOSIS — Z8249 Family history of ischemic heart disease and other diseases of the circulatory system: Secondary | ICD-10-CM | POA: Insufficient documentation

## 2018-08-02 DIAGNOSIS — I252 Old myocardial infarction: Secondary | ICD-10-CM | POA: Insufficient documentation

## 2018-08-02 DIAGNOSIS — K5730 Diverticulosis of large intestine without perforation or abscess without bleeding: Secondary | ICD-10-CM | POA: Diagnosis not present

## 2018-08-02 DIAGNOSIS — G4700 Insomnia, unspecified: Secondary | ICD-10-CM | POA: Diagnosis not present

## 2018-08-02 DIAGNOSIS — G8929 Other chronic pain: Secondary | ICD-10-CM | POA: Insufficient documentation

## 2018-08-02 DIAGNOSIS — F17210 Nicotine dependence, cigarettes, uncomplicated: Secondary | ICD-10-CM | POA: Diagnosis not present

## 2018-08-02 DIAGNOSIS — Z886 Allergy status to analgesic agent status: Secondary | ICD-10-CM | POA: Diagnosis not present

## 2018-08-02 DIAGNOSIS — G40909 Epilepsy, unspecified, not intractable, without status epilepticus: Secondary | ICD-10-CM | POA: Insufficient documentation

## 2018-08-02 DIAGNOSIS — G4730 Sleep apnea, unspecified: Secondary | ICD-10-CM | POA: Diagnosis not present

## 2018-08-02 DIAGNOSIS — I2510 Atherosclerotic heart disease of native coronary artery without angina pectoris: Secondary | ICD-10-CM | POA: Insufficient documentation

## 2018-08-02 DIAGNOSIS — E1140 Type 2 diabetes mellitus with diabetic neuropathy, unspecified: Secondary | ICD-10-CM | POA: Insufficient documentation

## 2018-08-02 DIAGNOSIS — I10 Essential (primary) hypertension: Secondary | ICD-10-CM | POA: Insufficient documentation

## 2018-08-02 DIAGNOSIS — Z79899 Other long term (current) drug therapy: Secondary | ICD-10-CM | POA: Diagnosis not present

## 2018-08-02 DIAGNOSIS — Z981 Arthrodesis status: Secondary | ICD-10-CM | POA: Diagnosis not present

## 2018-08-02 DIAGNOSIS — Z1211 Encounter for screening for malignant neoplasm of colon: Secondary | ICD-10-CM | POA: Diagnosis not present

## 2018-08-02 DIAGNOSIS — Z794 Long term (current) use of insulin: Secondary | ICD-10-CM | POA: Diagnosis not present

## 2018-08-02 DIAGNOSIS — F209 Schizophrenia, unspecified: Secondary | ICD-10-CM | POA: Diagnosis not present

## 2018-08-02 DIAGNOSIS — K2950 Unspecified chronic gastritis without bleeding: Secondary | ICD-10-CM | POA: Insufficient documentation

## 2018-08-02 DIAGNOSIS — Z86010 Personal history of colonic polyps: Secondary | ICD-10-CM | POA: Diagnosis not present

## 2018-08-02 DIAGNOSIS — F319 Bipolar disorder, unspecified: Secondary | ICD-10-CM | POA: Diagnosis not present

## 2018-08-02 DIAGNOSIS — Z951 Presence of aortocoronary bypass graft: Secondary | ICD-10-CM | POA: Diagnosis not present

## 2018-08-02 HISTORY — PX: COLONOSCOPY WITH PROPOFOL: SHX5780

## 2018-08-02 LAB — GLUCOSE, CAPILLARY
GLUCOSE-CAPILLARY: 82 mg/dL (ref 70–99)
Glucose-Capillary: 89 mg/dL (ref 70–99)

## 2018-08-02 SURGERY — COLONOSCOPY WITH PROPOFOL
Anesthesia: Monitor Anesthesia Care

## 2018-08-02 MED ORDER — PROPOFOL 10 MG/ML IV BOLUS
INTRAVENOUS | Status: DC | PRN
  Administered 2018-08-02 (×2): 40 mg via INTRAVENOUS

## 2018-08-02 MED ORDER — PROPOFOL 10 MG/ML IV BOLUS
INTRAVENOUS | Status: AC
  Filled 2018-08-02: qty 20

## 2018-08-02 MED ORDER — CHLORHEXIDINE GLUCONATE CLOTH 2 % EX PADS: 6.0000 | MEDICATED_PAD | Freq: Once | CUTANEOUS | Status: DC

## 2018-08-02 MED ORDER — PROPOFOL 500 MG/50ML IV EMUL
INTRAVENOUS | Status: DC | PRN
  Administered 2018-08-02: 100 ug/kg/min via INTRAVENOUS
  Administered 2018-08-02: 200 ug/kg/min via INTRAVENOUS

## 2018-08-02 MED ORDER — MIDAZOLAM HCL 2 MG/2ML IJ SOLN
INTRAMUSCULAR | Status: AC
  Filled 2018-08-02: qty 2

## 2018-08-02 MED ORDER — LACTATED RINGERS IV SOLN
INTRAVENOUS | Status: DC
  Administered 2018-08-02: 11:00:00 via INTRAVENOUS

## 2018-08-02 MED ORDER — MIDAZOLAM HCL 5 MG/5ML IJ SOLN
INTRAMUSCULAR | Status: DC | PRN
  Administered 2018-08-02: 2 mg via INTRAVENOUS

## 2018-08-02 NOTE — Anesthesia Postprocedure Evaluation (Signed)
Anesthesia Post Note  Patient: Melvin Taylor  Procedure(s) Performed: COLONOSCOPY WITH PROPOFOL (N/A )  Patient location during evaluation: PACU Anesthesia Type: MAC Level of consciousness: awake and alert and patient cooperative Pain management: satisfactory to patient Vital Signs Assessment: post-procedure vital signs reviewed and stable Respiratory status: spontaneous breathing Cardiovascular status: stable Postop Assessment: no apparent nausea or vomiting Anesthetic complications: no     Last Vitals:  Vitals:   08/02/18 1147 08/02/18 1200  BP: 98/66 103/72  Pulse: (!) 49 (!) 46  Resp: 16 (!) 23  Temp: 36.8 C   SpO2: 99% 98%    Last Pain:  Vitals:   08/02/18 1147  TempSrc:   PainSc: Asleep                 Vonna Brabson

## 2018-08-02 NOTE — Op Note (Signed)
[facility] Patient Name: Melvin Taylor Procedure Date: 08/02/2018 11:08 AM MRN: [id number] Date of Birth: 06/27/1966 Attending MD: Norvel Richards , MD CSN: [id number] Age: 53 Admit Type: Outpatient Procedure:                Colonoscopy Indications:              High risk colon cancer surveillance: Personal                            history of colonic polyps Providers:                Norvel Richards, MD, Janeece Riggers, RN, Randa Spike, Technician Referring MD:             Rosita Fire MD, MD Medicines:                Propofol per Anesthesia Complications:            No immediate complications. Estimated Blood Loss:     Estimated blood loss: none. Procedure:                Pre-Anesthesia Assessment:                           - Prior to the procedure, a History and Physical                            was performed, and patient medications and                            allergies were reviewed. The patient's tolerance of                            previous anesthesia was also reviewed. The risks                            and benefits of the procedure and the sedation                            options and risks were discussed with the patient.                            All questions were answered, and informed consent                            was obtained. Prior Anticoagulants: The patient has                            taken no previous anticoagulant or antiplatelet                            agents. ASA Grade Assessment: III - A patient with  severe systemic disease. After reviewing the risks                            and benefits, the patient was deemed in                            satisfactory condition to undergo the procedure.                           After obtaining informed consent, the colonoscope                            was passed under direct vision. Throughout the   procedure, the patient's blood pressure, pulse, and                            oxygen saturations were monitored continuously. The                            [id number]-[id number] ([id number]) scope was introduced through                            the and advanced to the the cecum, identified by                            appendiceal orifice and ileocecal valve. The                            colonoscopy was performed without difficulty. The                            patient tolerated the procedure well. The quality                            of the bowel preparation was adequate. The                            ileocecal valve, appendiceal orifice, and rectum                            were photographed. The entire colon was well                            visualized. Scope In: 11:29:52 AM Scope Out: 11:39:48 AM Scope Withdrawal Time: 0 hours 6 minutes 4 seconds  Total Procedure Duration: 0 hours 9 minutes 56 seconds  Findings:      The perianal and digital rectal examinations were normal.      Scattered medium-mouthed diverticula were found in the sigmoid colon and       descending colon.      The exam was otherwise without abnormality on direct and retroflexion       views. Impression:               - Diverticulosis in the sigmoid colon and in the  descending colon.                           - The examination was otherwise normal on direct                            and retroflexion views.                           - No specimens collected. Moderate Sedation:      Moderate (conscious) sedation was personally administered by an       anesthesia professional. The following parameters were monitored: oxygen       saturation, heart rate, blood pressure, respiratory rate, EKG, adequacy       of pulmonary ventilation, and response to care. Recommendation:           - Patient has a contact number available for                            emergencies. The signs and symptoms of  potential                            delayed complications were discussed with the                            patient. Return to normal activities tomorrow.                            Written discharge instructions were provided to the                            patient.                           - Resume previous diet.                           - Continue present medications.                           - Repeat colonoscopy in 5 years for surveillance.                           - Return to GI office PRN. Procedure Code(s):        --- Professional ---                           [phone], Colonoscopy, flexible; diagnostic, including                            collection of specimen(s) by brushing or washing,                            when performed (separate procedure) Diagnosis Code(s):        --- Professional ---  Z86.010, Personal history of colonic polyps                           K57.30, Diverticulosis of large intestine without                            perforation or abscess without bleeding CPT copyright 2018 American Medical Association. All rights reserved. The codes documented in this report are preliminary and upon coder review may  be revised to meet current compliance requirements. Cristopher Estimable. Chinonso Linker, MD Norvel Richards, MD 08/02/2018 11:45:15 AM This report has been signed electronically. Number of Addenda: 0

## 2018-08-02 NOTE — Discharge Instructions (Signed)
Colonoscopy Discharge Instructions  Read the instructions outlined below and refer to this sheet in the next few weeks. These discharge instructions provide you with general information on caring for yourself after you leave the hospital. Your doctor may also give you specific instructions. While your treatment has been planned according to the most current medical practices available, unavoidable complications occasionally occur. If you have any problems or questions after discharge, call Dr. Gala Romney at [phone]. ACTIVITY  You may resume your regular activity, but move at a slower pace for the next 24 hours.   Take frequent rest periods for the next 24 hours.   Walking will help get rid of the air and reduce the bloated feeling in your belly (abdomen).   No driving for 24 hours (because of the medicine (anesthesia) used during the test).    Do not sign any important legal documents or operate any machinery for 24 hours (because of the anesthesia used during the test).  NUTRITION  Drink plenty of fluids.   You may resume your normal diet as instructed by your doctor.   Begin with a light meal and progress to your normal diet. Heavy or fried foods are harder to digest and may make you feel sick to your stomach (nauseated).   Avoid alcoholic beverages for 24 hours or as instructed.  MEDICATIONS  You may resume your normal medications unless your doctor tells you otherwise.  WHAT YOU CAN EXPECT TODAY  Some feelings of bloating in the abdomen.   Passage of more gas than usual.   Spotting of blood in your stool or on the toilet paper.  IF YOU HAD POLYPS REMOVED DURING THE COLONOSCOPY:  No aspirin products for 7 days or as instructed.   No alcohol for 7 days or as instructed.   Eat a soft diet for the next 24 hours.  FINDING OUT THE RESULTS OF YOUR TEST Not all test results are available during your visit. If your test results are not back during the visit, make an appointment  with your caregiver to find out the results. Do not assume everything is normal if you have not heard from your caregiver or the medical facility. It is important for you to follow up on all of your test results.  SEEK IMMEDIATE MEDICAL ATTENTION IF:  You have more than a spotting of blood in your stool.   Your belly is swollen (abdominal distention).   You are nauseated or vomiting.   You have a temperature over 101.   You have abdominal pain or discomfort that is severe or gets worse throughout the day.    Diverticulosis  Diverticulosis is a condition that develops when small pouches (diverticula) form in the wall of the large intestine (colon). The colon is where water is absorbed and stool is formed. The pouches form when the inside layer of the colon pushes through weak spots in the outer layers of the colon. You may have a few pouches or many of them. What are the causes? The cause of this condition is not known. What increases the risk? The following factors may make you more likely to develop this condition:  Being older than age 63. Your risk for this condition increases with age. Diverticulosis is rare among people younger than age 61. By age 53, many people have it.  Eating a low-fiber diet.  Having frequent constipation.  Being overweight.  Not getting enough exercise.  Smoking.  Taking over-the-counter pain medicines, like aspirin and ibuprofen.  Having a family history of diverticulosis. What are the signs or symptoms? In most people, there are no symptoms of this condition. If you do have symptoms, they may include:  Bloating.  Cramps in the abdomen.  Constipation or diarrhea.  Pain in the lower left side of the abdomen. How is this diagnosed? This condition is most often diagnosed during an exam for other colon problems. Because diverticulosis usually has no symptoms, it often cannot be diagnosed independently. This condition may be diagnosed  by:  Using a flexible scope to examine the colon (colonoscopy).  Taking an X-ray of the colon after dye has been put into the colon (barium enema).  Doing a CT scan. How is this treated? You may not need treatment for this condition if you have never developed an infection related to diverticulosis. If you have had an infection before, treatment may include:  Eating a high-fiber diet. This may include eating more fruits, vegetables, and grains.  Taking a fiber supplement.  Taking a live bacteria supplement (probiotic).  Taking medicine to relax your colon.  Taking antibiotic medicines. Follow these instructions at home:  Drink 6-8 glasses of water or more each day to prevent constipation.  Try not to strain when you have a bowel movement.  If you have had an infection before: ? Eat more fiber as directed by your health care provider or your diet and nutrition specialist (dietitian). ? Take a fiber supplement or probiotic, if your health care provider approves.  Take over-the-counter and prescription medicines only as told by your health care provider.  If you were prescribed an antibiotic, take it as told by your health care provider. Do not stop taking the antibiotic even if you start to feel better.  Keep all follow-up visits as told by your health care provider. This is important. Contact a health care provider if:  You have pain in your abdomen.  You have bloating.  You have cramps.  You have not had a bowel movement in 3 days. Get help right away if:  Your pain gets worse.  Your bloating becomes very bad.  You have a fever or chills, and your symptoms suddenly get worse.  You vomit.  You have bowel movements that are bloody or black.  You have bleeding from your rectum. Summary  Diverticulosis is a condition that develops when small pouches (diverticula) form in the wall of the large intestine (colon).  You may have a few pouches or many of  them.  This condition is most often diagnosed during an exam for other colon problems.  If you have had an infection related to diverticulosis, treatment may include increasing the fiber in your diet, taking supplements, or taking medicines. This information is not intended to replace advice given to you by your health care provider. Make sure you discuss any questions you have with your health care provider. Document Released: 04/14/2004 Document Revised: 06/06/2016 Document Reviewed: 06/06/2016 Elsevier Interactive Patient Education  2019 [employer], Care After These instructions provide you with information about caring for yourself after your procedure. Your health care provider may also give you more specific instructions. Your treatment has been planned according to current medical practices, but problems sometimes occur. Call your health care provider if you have any problems or questions after your procedure. What can I expect after the procedure? After your procedure, you may:  Feel sleepy for several hours.  Feel clumsy and have poor balance for several hours.  Feel forgetful about what happened after the procedure.  Have poor judgment for several hours.  Feel nauseous or vomit.  Have a sore throat if you had a breathing tube during the procedure. Follow these instructions at home: For at least 24 hours after the procedure:      Have a responsible adult stay with you. It is important to have someone help care for you until you are awake and alert.  Rest as needed.  Do not: ? Participate in activities in which you could fall or become injured. ? Drive. ? Use heavy machinery. ? Drink alcohol. ? Take sleeping pills or medicines that cause drowsiness. ? Make important decisions or sign legal documents. ? Take care of children on your own. Eating and drinking  Follow the diet that is recommended by your health care provider.  If you  vomit, drink water, juice, or soup when you can drink without vomiting.  Make sure you have little or no nausea before eating solid foods. General instructions  Take over-the-counter and prescription medicines only as told by your health care provider.  If you have sleep apnea, surgery and certain medicines can increase your risk for breathing problems. Follow instructions from your health care provider about wearing your sleep device: ? Anytime you are sleeping, including during daytime naps. ? While taking prescription pain medicines, sleeping medicines, or medicines that make you drowsy.  If you smoke, do not smoke without supervision.  Keep all follow-up visits as told by your health care provider. This is important. Contact a health care provider if:  You keep feeling nauseous or you keep vomiting.  You feel light-headed.  You develop a rash.  You have a fever. Get help right away if:  You have trouble breathing. Summary  For several hours after your procedure, you may feel sleepy and have poor judgment.  Have a responsible adult stay with you for at least 24 hours or until you are awake and alert. This information is not intended to replace advice given to you by your health care provider. Make sure you discuss any questions you have with your health care provider. Document Released: 11/08/2015 Document Revised: 03/03/2017 Document Reviewed: 11/08/2015 Elsevier Interactive Patient Education  2019 [employer].  Diverticulosis information provided  No polyps found today  Repeat colonoscopy in 5 years

## 2018-08-02 NOTE — Anesthesia Preprocedure Evaluation (Signed)
Anesthesia Evaluation  Patient identified by MRN, date of birth, ID band Patient awake    Reviewed: Allergy & Precautions, H&P , NPO status , Patient's Chart, lab work & pertinent test results, reviewed documented beta blocker date and time   Airway Mallampati: II  TM Distance: >3 FB Neck ROM: full    Dental  (+) Missing   Pulmonary shortness of breath, sleep apnea , Current Smoker,    Pulmonary exam normal breath sounds clear to auscultation       Cardiovascular Exercise Tolerance: Good hypertension, + CAD, + Past MI and + CABG   Rhythm:regular Rate:Normal     Neuro/Psych  Headaches, Seizures -,  PSYCHIATRIC DISORDERS Anxiety Depression Bipolar Disorder Schizophrenia  Neuromuscular disease    GI/Hepatic negative GI ROS, Neg liver ROS,   Endo/Other  negative endocrine ROSdiabetes  Renal/GU negative Renal ROS  negative genitourinary   Musculoskeletal   Abdominal   Peds  Hematology  (+) Blood dyscrasia, anemia ,   Anesthesia Other Findings   Reproductive/Obstetrics negative OB ROS                             Anesthesia Physical Anesthesia Plan  ASA: III  Anesthesia Plan: MAC   Post-op Pain Management:    Induction:   PONV Risk Score and Plan:   Airway Management Planned:   Additional Equipment:   Intra-op Plan:   Post-operative Plan:   Informed Consent: I have reviewed the patients History and Physical, chart, labs and discussed the procedure including the risks, benefits and alternatives for the proposed anesthesia with the patient or authorized representative who has indicated his/her understanding and acceptance.   Dental Advisory Given  Plan Discussed with: CRNA  Anesthesia Plan Comments:         Anesthesia Quick Evaluation

## 2018-08-02 NOTE — Anesthesia Procedure Notes (Signed)
Procedure Name: MAC Date/Time: 08/02/2018 11:24 AM Performed by: Vista Deck, CRNA Pre-anesthesia Checklist: Patient identified, Emergency Drugs available, Suction available, Timeout performed and Patient being monitored Patient Re-evaluated:Patient Re-evaluated prior to induction Oxygen Delivery Method: Nasal Cannula

## 2018-08-02 NOTE — Interval H&P Note (Signed)
History and Physical Interval Note:  08/02/2018 11:14 AM  Melvin Taylor  has presented today for surgery, with the diagnosis of h/o colon polyps  The various methods of treatment have been discussed with the patient and family. After consideration of risks, benefits and other options for treatment, the patient has consented to  Procedure(s) with comments: COLONOSCOPY WITH PROPOFOL (N/A) - 12:00pm as a surgical intervention .  The patient's history has been reviewed, patient examined, no change in status, stable for surgery.  I have reviewed the patient's chart and labs.  Questions were answered to the patient's satisfaction.     Brad Lieurance  No change.  Patient is here for surveillance colonoscopy per plan.  The risks, benefits, limitations, alternatives and imponderables have been reviewed with the patient. Questions have been answered. All parties are agreeable.

## 2018-08-02 NOTE — Transfer of Care (Signed)
Immediate Anesthesia Transfer of Care Note  Patient: Melvin Taylor  Procedure(s) Performed: COLONOSCOPY WITH PROPOFOL (N/A )  Patient Location: PACU  Anesthesia Type:MAC  Level of Consciousness: oriented and patient cooperative  Airway & Oxygen Therapy: Patient Spontanous Breathing and Patient connected to nasal cannula oxygen  Post-op Assessment: Report given to RN and Post -op Vital signs reviewed and stable  Post vital signs: Reviewed and stable  Last Vitals:  Vitals Value Taken Time  BP    Temp    Pulse 82 08/02/2018 11:47 AM  Resp 16 08/02/2018 11:47 AM  SpO2 99 % 08/02/2018 11:47 AM  Vitals shown include unvalidated device data.  Last Pain:  Vitals:   08/02/18 1131  TempSrc:   PainSc: 0-No pain         Complications: No apparent anesthesia complications

## 2018-08-07 ENCOUNTER — Other Ambulatory Visit: Payer: Self-pay | Admitting: Cardiovascular Disease

## 2018-08-08 ENCOUNTER — Encounter (HOSPITAL_COMMUNITY): Payer: Self-pay | Admitting: Internal Medicine

## 2018-08-10 ENCOUNTER — Ambulatory Visit: Payer: Self-pay | Admitting: Diagnostic Neuroimaging

## 2018-08-10 ENCOUNTER — Encounter: Payer: Self-pay | Admitting: Diagnostic Neuroimaging
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
## 2018-08-21 ENCOUNTER — Ambulatory Visit: Payer: Medicaid Other | Admitting: Diagnostic Neuroimaging

## 2018-08-21 ENCOUNTER — Encounter: Payer: Self-pay | Admitting: Diagnostic Neuroimaging

## 2018-08-21 VITALS — BP 145/87 | HR 56 | Ht 67.0 in | Wt 165.8 lb

## 2018-08-21 DIAGNOSIS — F3170 Bipolar disorder, currently in remission, most recent episode unspecified: Secondary | ICD-10-CM | POA: Diagnosis not present

## 2018-08-21 DIAGNOSIS — G40219 Localization-related (focal) (partial) symptomatic epilepsy and epileptic syndromes with complex partial seizures, intractable, without status epilepticus: Secondary | ICD-10-CM

## 2018-08-21 DIAGNOSIS — R442 Other hallucinations: Secondary | ICD-10-CM

## 2018-08-21 MED ORDER — DIVALPROEX SODIUM 500 MG PO DR TAB: 1500.0000 mg | DELAYED_RELEASE_TABLET | Freq: Two times a day (BID) | ORAL | 4 refills | Status: DC

## 2018-08-21 MED ORDER — LEVETIRACETAM 750 MG PO TABS: 1500.0000 mg | ORAL_TABLET | Freq: Two times a day (BID) | ORAL | 4 refills | Status: DC

## 2018-08-21 NOTE — Patient Instructions (Signed)
SEIZURE DISORDER - continue depakote 1500mg  twice a day - increase levetiracetam to 1500mg  twice a day
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
## 2018-08-23 ENCOUNTER — Ambulatory Visit: Payer: Medicaid Other | Admitting: Cardiovascular Disease

## 2018-09-17 ENCOUNTER — Telehealth: Payer: Self-pay | Admitting: Diagnostic Neuroimaging

## 2018-09-17 DIAGNOSIS — R431 Parosmia: Secondary | ICD-10-CM

## 2018-09-17 DIAGNOSIS — R442 Other hallucinations: Secondary | ICD-10-CM

## 2018-09-17 NOTE — Telephone Encounter (Signed)
Spoke with pt. He sts. olfactory hallucinations (blood smell) are now daily. Sts. he has checked with mental health, and none of the meds they rx. would be the cause.  Will check with Dr. Leta Baptist to see if there are other options for tx./fim

## 2018-09-17 NOTE — Telephone Encounter (Signed)
Pt has asked that RN calls him back because for about 3-4 weeks he is always smelling blood, and it is aggrivating him.  Please call

## 2018-09-17 NOTE — Telephone Encounter (Signed)
Continue anti-seizure meds. May need to refer to ENT or academic epilepsy clinic for further workup. -VRP

## 2018-09-18 NOTE — Addendum Note (Signed)
Addended by: Minna Antis on: 09/18/2018 08:34 AM   Modules accepted: Orders

## 2018-09-18 NOTE — Addendum Note (Signed)
Addended by: Andrey Spearman R on: 09/18/2018 09:45 AM   Modules accepted: Orders

## 2018-09-18 NOTE — Telephone Encounter (Signed)
Spoke with patient and advised of Dr Gladstone Lighter recommendations. He stated he would prefer to have both referrals. He'd like to see ENT in [place] and be referred to epilepsy clinic, [facility]. I advised him referrals wll be placed. If he hasn't heard back in a week, he can call to check on them. I advised those offices should call him to schedule appointments.  Patient verbalized understanding, appreciation.

## 2018-09-27 ENCOUNTER — Ambulatory Visit (INDEPENDENT_AMBULATORY_CARE_PROVIDER_SITE_OTHER): Payer: Medicaid Other

## 2018-09-27 ENCOUNTER — Encounter: Payer: Self-pay | Admitting: Podiatry

## 2018-09-27 ENCOUNTER — Ambulatory Visit (INDEPENDENT_AMBULATORY_CARE_PROVIDER_SITE_OTHER): Payer: Medicaid Other | Admitting: Podiatry

## 2018-09-27 DIAGNOSIS — L84 Corns and callosities: Secondary | ICD-10-CM | POA: Diagnosis not present

## 2018-09-27 DIAGNOSIS — M79675 Pain in left toe(s): Secondary | ICD-10-CM

## 2018-09-27 DIAGNOSIS — M7752 Other enthesopathy of left foot: Secondary | ICD-10-CM | POA: Diagnosis not present

## 2018-09-27 DIAGNOSIS — B351 Tinea unguium: Secondary | ICD-10-CM | POA: Diagnosis not present

## 2018-09-27 DIAGNOSIS — M7750 Other enthesopathy of unspecified foot: Secondary | ICD-10-CM

## 2018-09-27 DIAGNOSIS — E1142 Type 2 diabetes mellitus with diabetic polyneuropathy: Secondary | ICD-10-CM | POA: Diagnosis not present

## 2018-09-27 DIAGNOSIS — M79674 Pain in right toe(s): Secondary | ICD-10-CM

## 2018-09-27 DIAGNOSIS — S90112A Contusion of left great toe without damage to nail, initial encounter: Secondary | ICD-10-CM

## 2018-09-27 NOTE — Patient Instructions (Signed)
Diabetes Mellitus and Foot Care  Foot care is an important part of your health, especially when you have diabetes. Diabetes may cause you to have problems because of poor blood flow (circulation) to your feet and legs, which can cause your skin to:   Become thinner and drier.   Break more easily.   Heal more slowly.   Peel and crack.  You may also have nerve damage (neuropathy) in your legs and feet, causing decreased feeling in them. This means that you may not notice minor injuries to your feet that could lead to more serious problems. Noticing and addressing any potential problems early is the best way to prevent future foot problems.  How to care for your feet  Foot hygiene   Wash your feet daily with warm water and mild soap. Do not use hot water. Then, pat your feet and the areas between your toes until they are completely dry. Do not soak your feet as this can dry your skin.   Trim your toenails straight across. Do not dig under them or around the cuticle. File the edges of your nails with an emery board or nail file.   Apply a moisturizing lotion or petroleum jelly to the skin on your feet and to dry, brittle toenails. Use lotion that does not contain alcohol and is unscented. Do not apply lotion between your toes.  Shoes and socks   Wear clean socks or stockings every day. Make sure they are not too tight. Do not wear knee-high stockings since they may decrease blood flow to your legs.   Wear shoes that fit properly and have enough cushioning. Always look in your shoes before you put them on to be sure there are no objects inside.   To break in new shoes, wear them for just a few hours a day. This prevents injuries on your feet.  Wounds, scrapes, corns, and calluses   Check your feet daily for blisters, cuts, bruises, sores, and redness. If you cannot see the bottom of your feet, use a mirror or ask someone for help.   Do not cut corns or calluses or try to remove them with medicine.   If you  find a minor scrape, cut, or break in the skin on your feet, keep it and the skin around it clean and dry. You may clean these areas with mild soap and water. Do not clean the area with peroxide, alcohol, or iodine.   If you have a wound, scrape, corn, or callus on your foot, look at it several times a day to make sure it is healing and not infected. Check for:  ? Redness, swelling, or pain.  ? Fluid or blood.  ? Warmth.  ? Pus or a bad smell.  General instructions   Do not cross your legs. This may decrease blood flow to your feet.   Do not use heating pads or hot water bottles on your feet. They may burn your skin. If you have lost feeling in your feet or legs, you may not know this is happening until it is too late.   Protect your feet from hot and cold by wearing shoes, such as at the beach or on hot pavement.   Schedule a complete foot exam at least once a year (annually) or more often if you have foot problems. If you have foot problems, report any cuts, sores, or bruises to your health care provider immediately.  Contact a health care provider if:     You have a medical condition that increases your risk of infection and you have any cuts, sores, or bruises on your feet.   You have an injury that is not healing.   You have redness on your legs or feet.   You feel burning or tingling in your legs or feet.   You have pain or cramps in your legs and feet.   Your legs or feet are numb.   Your feet always feel cold.   You have pain around a toenail.  Get help right away if:   You have a wound, scrape, corn, or callus on your foot and:  ? You have pain, swelling, or redness that gets worse.  ? You have fluid or blood coming from the wound, scrape, corn, or callus.  ? Your wound, scrape, corn, or callus feels warm to the touch.  ? You have pus or a bad smell coming from the wound, scrape, corn, or callus.  ? You have a fever.  ? You have a red line going up your leg.  Summary   Check your feet every day  for cuts, sores, red spots, swelling, and blisters.   Moisturize feet and legs daily.   Wear shoes that fit properly and have enough cushioning.   If you have foot problems, report any cuts, sores, or bruises to your health care provider immediately.   Schedule a complete foot exam at least once a year (annually) or more often if you have foot problems.  This information is not intended to replace advice given to you by your health care provider. Make sure you discuss any questions you have with your health care provider.  Document Released: 07/15/2000 Document Revised: 08/30/2017 Document Reviewed: 08/19/2016  [employer]vier Interactive Patient Education  2019 [employer]

## 2018-10-06 NOTE — Progress Notes (Signed)
Subjective: Melvin Taylor presents for diabetic preventative foot care on today.  He voices no new problems on today's visit.  He is still on blood thinner plavix and also has numbness in both feet.   Rosita Fire, MD is his PCP.   Current Outpatient Medications:  .  ACCU-CHEK AVIVA PLUS test strip, USE AS DIRECTED TO TEST 4 TIMES DAILY., Disp: 150 each, Rfl: 5 .  albuterol (PROVENTIL HFA;VENTOLIN HFA) 108 (90 Base) MCG/ACT inhaler, Inhale 1-2 puffs into the lungs every 6 (six) hours as needed for wheezing or shortness of breath., Disp: , Rfl:  .  amLODipine (NORVASC) 10 MG tablet, TAKE 1 TABLET BY MOUTH ONCE DAILY., Disp: 90 tablet, Rfl: 1 .  atorvastatin (LIPITOR) 40 MG tablet, Take 1 tablet (40 mg total) by mouth daily., Disp: 90 tablet, Rfl: 3 .  diazepam (VALIUM) 5 MG tablet, Take 5 mg by mouth 2 (two) times daily as needed for anxiety. Can take 2 tablets at bedtime, Disp: , Rfl:  .  divalproex (DEPAKOTE) 500 MG DR tablet, Take 3 tablets (1,500 mg total) by mouth 2 (two) times daily., Disp: 540 tablet, Rfl: 4 .  escitalopram (LEXAPRO) 10 MG tablet, Take 10 mg by mouth daily., Disp: , Rfl:  .  Insulin Glargine (LANTUS SOLOSTAR) 100 UNIT/ML Solostar Pen, Inject 30 Units into the skin at bedtime., Disp: 15 mL, Rfl: 0 .  levETIRAcetam (KEPPRA) 750 MG tablet, Take 2 tablets (1,500 mg total) by mouth 2 (two) times daily., Disp: 360 tablet, Rfl: 4 .  lisinopril (PRINIVIL,ZESTRIL) 10 MG tablet, Take 1 tablet (10 mg total) by mouth daily., Disp: 90 tablet, Rfl: 3 .  metoprolol tartrate (LOPRESSOR) 25 MG tablet, Take 0.5 tablets (12.5 mg total) by mouth 2 (two) times daily., Disp: 90 tablet, Rfl: 3 .  oxyCODONE-acetaminophen (PERCOCET/ROXICET) 5-325 MG tablet, Take 1 tablet by mouth every 6 (six) hours as needed., Disp: 20 tablet, Rfl: 0 .  paliperidone (INVEGA) 6 MG 24 hr tablet, Take 12 mg by mouth daily., Disp: , Rfl:  .  RANEXA 500 MG 12 hr tablet, TAKE (1) TABLET BY MOUTH TWICE DAILY.,  Disp: 60 tablet, Rfl: 11 .  risperiDONE (RISPERDAL) 2 MG tablet, Take 2 mg by mouth at bedtime., Disp: , Rfl:   Allergies  Allergen Reactions  . Aspirin Swelling and Other (See Comments)    Facial swelling     Vascular Examination: Capillary refill time <3 seconds x 10 digits.  Dorsalis pedis and Posterior tibial pulses present b/l.  No digital hair x 10 digits.  Skin temperature WNL b/l.  Dermatological Examination: Skin with normal turgor, texture and tone b/l.  Toenails 1-5 b/l discolored, thick, dystrophic with subungual debris and pain with palpation to nailbeds due to thickness of nails.  Hyperkeratotic lesion submet head 1 b/l and plantarmedial aspect of hallux IPJ b/l.  No erythema, no edema, no drainage noted b/l.  Area of ecchymosis/tenderness noted plantar aspect left hallux IPJ. Area measures 1.5 x 1.0 cm. +Palpable bony prominence. No flocculence, no impending wound. No edema, no warmth.  Musculoskeletal: Muscle strength 5/5 to all LE muscle groups  Neurological: Sensation diminished with 10 gram monofilament. Vibratory sensation diminished  Xray left foot: osteophyte noted plantarmedial IPJ left hallux  Assessment: 1. Painful onychomycosis toenails 1-5 b/l 2. Calluses submet head 1 left, left hallux  3. Hallux IPJ osteophyte left foot 4. NIDDM with Diabetic neuropathy  Plan: 1. Continue diabetic foot care principles. Literature dispensed on today. 2. Xray left foot  and review with Mr. Thal. Discussed conservative and surgical options regarding osteophyte. He would like to explore surgical correction.  3. Toenails 1-5 b/l were debrided in length and girth without iatrogenic bleeding. 4. Hyperkeratotic lesion pared with sterile chisel blade 5. Patient to continue soft, supportive shoe gear. 6. Consult Dr. Hardie Pulley regarding osteophyte left hallux IPJ. Patient will see Dr. March Rummage in 2 weeks. 7. Patient to report any pedal injuries to medical  professional  8. Follow up 3 months with me for diabetic foot care. Patient/POA to call should there be a concern in the interim.

## 2018-10-11 ENCOUNTER — Ambulatory Visit (INDEPENDENT_AMBULATORY_CARE_PROVIDER_SITE_OTHER): Payer: Medicaid Other | Admitting: Podiatry

## 2018-10-11 ENCOUNTER — Other Ambulatory Visit: Payer: Self-pay

## 2018-10-11 DIAGNOSIS — M7750 Other enthesopathy of unspecified foot: Secondary | ICD-10-CM | POA: Diagnosis not present

## 2018-10-18 ENCOUNTER — Telehealth: Payer: Self-pay | Admitting: Podiatry

## 2018-10-18 NOTE — Telephone Encounter (Signed)
Pt states he is suppose having a shooting pain from his toe up the left leg and the toe is red, with a black spot on the bottom. I asked pt if the pain was in the left leg and if he had redness, hardness and pt states it is just hurting. I offered pt an appt tomorrow and he states he can't make it due to transportation, but will take one next week.

## 2018-10-18 NOTE — Telephone Encounter (Signed)
I'm scheduled to have surgery in the next 2-3 weeks. I'm having severe in my left leg where I'm supposed to have surgery. I need the nurse to call me back as soon as possible.

## 2018-10-23 ENCOUNTER — Ambulatory Visit: Payer: Medicaid Other | Admitting: Podiatry

## 2018-10-25 ENCOUNTER — Ambulatory Visit: Payer: Medicaid Other | Admitting: Podiatry

## 2018-10-25 ENCOUNTER — Encounter: Payer: Self-pay | Admitting: Podiatry

## 2018-10-25 ENCOUNTER — Telehealth: Payer: Self-pay | Admitting: *Deleted

## 2018-10-25 ENCOUNTER — Ambulatory Visit (INDEPENDENT_AMBULATORY_CARE_PROVIDER_SITE_OTHER): Payer: Medicaid Other | Admitting: Podiatry

## 2018-10-25 ENCOUNTER — Other Ambulatory Visit: Payer: Self-pay

## 2018-10-25 DIAGNOSIS — M7750 Other enthesopathy of unspecified foot: Secondary | ICD-10-CM | POA: Diagnosis not present

## 2018-10-25 DIAGNOSIS — M792 Neuralgia and neuritis, unspecified: Secondary | ICD-10-CM

## 2018-10-25 DIAGNOSIS — E1142 Type 2 diabetes mellitus with diabetic polyneuropathy: Secondary | ICD-10-CM

## 2018-10-25 MED ORDER — DICLOFENAC SODIUM 1 % TD GEL: 2.0000 g | Freq: Four times a day (QID) | TRANSDERMAL | 2 refills | Status: DC

## 2018-10-25 NOTE — Telephone Encounter (Signed)
Prepared orders to be faxed with 10/25/2018 clinicals once available, to [facility].

## 2018-10-25 NOTE — Telephone Encounter (Signed)
-----   Message from Trula Slade, DPM sent at 10/25/2018 10:22 AM EDT ----- Can you please order a nerve conduction test due to pain shooting up the left leg into the back? Thank you.

## 2018-10-30 NOTE — Progress Notes (Signed)
Subjective: 53 year old male presents the office today for concerns of left foot pain.  He states he is having sharp pain going from his foot all the way up his leg and into his hip.  He had previously seen Dr. Adah Perl and she diagnosed with bone spur and he had an injection apparently to the area as big toe which did not help.  He presents today for possible surgical consultation to discuss treatment options.  He denies any recent injury or trauma.  Denies any systemic complaints such as fevers, chills, nausea, vomiting. No acute changes since last appointment, and no other complaints at this time.   Objective: AAO x3, NAD DP/PT pulses palpable bilaterally, CRT less than 3 seconds There is no tenderness palpation of the hallux today.  He had some discomfort to the anterior aspect of the ankle but the majority of symptoms are causing this discomfort it was sharp pain going up the foot and going up his leg and into his hip/back.  There is no edema, erythema bilaterally. No open lesions or pre-ulcerative lesions.  No pain with calf compression, swelling, warmth, erythema  Assessment: Bone spur left hallux, neuritis symptoms  Plan: -All treatment options discussed with the patient including all alternatives, risks, complications.  -His pain seems to be out of proportion for a bone spur to the hallux for which he is presents here for surgical consultation.  I am not convinced that this is causing all of his discomfort and I think before proceeding with any surgery would like to further evaluate the symptoms.  I did ABIs in the office which was normal.  Left is 1.18 in the right was 1.16.  I more concerned about the sharp pain going up his leg.  Discussed starting gabapentin or Lyrica but he wants to hold off.  I ordered nerve conduction test today.  He did not seem happy that we did not proceed with surgery today however I do think his symptoms may be evaluated further.  Also because of the recent  COVID-19 we can do surgery base until the middle of May at this point so this will be a good time in order to further evaluate her symptoms. -Patient encouraged to call the office with any questions, concerns, change in symptoms.   Trula Slade DPM

## 2018-10-31 ENCOUNTER — Telehealth: Payer: Self-pay | Admitting: Podiatry

## 2018-10-31 NOTE — Telephone Encounter (Signed)
done

## 2018-10-31 NOTE — Telephone Encounter (Signed)
Patient was referred to have an EMG Test done but the office called stating that they are unable to see patients in their office at this time unless they are emergency cases sent from the hospital. Calling to make [place] aware of this.

## 2018-10-31 NOTE — Telephone Encounter (Signed)
Faxed orders, clinicals and demographics to [facility]. 

## 2018-11-01 NOTE — Telephone Encounter (Signed)
Thanks. We discussed going ahead and starting medication but he wanted to hold off.

## 2018-11-15 ENCOUNTER — Other Ambulatory Visit: Payer: Self-pay | Admitting: Adult Health

## 2019-02-10 NOTE — Progress Notes (Signed)
Subjective:  Patient ID: Melvin Taylor, male    DOB: 1966/07/05,  MRN: [id number]  No chief complaint on file.   53 y.o. male presents with the above complaint. Presents for surgical consult for removal of bone spurs.   Review of Systems: Negative except as noted in the HPI. Denies N/V/F/Ch.  Past Medical History:  Diagnosis Date  . Anxiety   . Bipolar 1 disorder ([place])   . Cervical radiculopathy   . Chest pain   . Chronic back pain   . Chronic chest wall pain   . Chronic neck pain   . Coronary artery disease   . Depression   . Diabetes mellitus    Type II  . Hallucinations    "long history of them"  . Headache(784.0)   . History of gout   . HTN (hypertension)   . Insomnia   . Myocardial infarction ([place]) 2017  . Neuropathy   . Pain management   . Polysubstance abuse ([place])   . Right leg pain    chronic  . Schizophrenia ([place])   . Seizures ([place])    last sz between July 5-9th, 2016; epilepsy  . Shortness of breath dyspnea    with exertion  . Sleep apnea   . Transfusion of blood product refused for religious reason     Current Outpatient Medications:  .  ACCU-CHEK AVIVA PLUS test strip, USE AS DIRECTED TO TEST 4 TIMES DAILY., Disp: 150 each, Rfl: 5 .  albuterol (PROVENTIL HFA;VENTOLIN HFA) 108 (90 Base) MCG/ACT inhaler, Inhale 1-2 puffs into the lungs every 6 (six) hours as needed for wheezing or shortness of breath., Disp: , Rfl:  .  amLODipine (NORVASC) 10 MG tablet, Take 1 tablet (10 mg total) by mouth daily. Pt needs appt to continue to receive refills, Disp: 90 tablet, Rfl: 1 .  atorvastatin (LIPITOR) 40 MG tablet, Take 1 tablet (40 mg total) by mouth daily., Disp: 90 tablet, Rfl: 3 .  clopidogrel (PLAVIX) 75 MG tablet, Take by mouth., Disp: , Rfl:  .  diazepam (VALIUM) 5 MG tablet, Take 5 mg by mouth 2 (two) times daily as needed for anxiety. Can take 2 tablets at bedtime, Disp: , Rfl:  .  diclofenac sodium (VOLTAREN) 1 % GEL, Apply 2 g topically 4 (four) times  daily. Rub into affected area of foot 2 to 4 times daily, Disp: 100 g, Rfl: 2 .  divalproex (DEPAKOTE) 500 MG DR tablet, Take 3 tablets (1,500 mg total) by mouth 2 (two) times daily., Disp: 540 tablet, Rfl: 4 .  escitalopram (LEXAPRO) 10 MG tablet, Take 10 mg by mouth daily., Disp: , Rfl:  .  Insulin Glargine (LANTUS SOLOSTAR) 100 UNIT/ML Solostar Pen, Inject 30 Units into the skin at bedtime., Disp: 15 mL, Rfl: 0 .  levETIRAcetam (KEPPRA) 750 MG tablet, Take 2 tablets (1,500 mg total) by mouth 2 (two) times daily., Disp: 360 tablet, Rfl: 4 .  lisinopril (PRINIVIL,ZESTRIL) 10 MG tablet, Take 1 tablet (10 mg total) by mouth daily., Disp: 90 tablet, Rfl: 3 .  metoprolol tartrate (LOPRESSOR) 25 MG tablet, Take 0.5 tablets (12.5 mg total) by mouth 2 (two) times daily., Disp: 90 tablet, Rfl: 3 .  oxyCODONE-acetaminophen (PERCOCET/ROXICET) 5-325 MG tablet, Take 1 tablet by mouth every 6 (six) hours as needed., Disp: 20 tablet, Rfl: 0 .  paliperidone (INVEGA) 6 MG 24 hr tablet, Take 12 mg by mouth daily., Disp: , Rfl:  .  RANEXA 500 MG 12 hr tablet, TAKE (1)  TABLET BY MOUTH TWICE DAILY., Disp: 60 tablet, Rfl: 11 .  risperiDONE (RISPERDAL) 2 MG tablet, Take 2 mg by mouth at bedtime., Disp: , Rfl:   Social History   Tobacco Use  Smoking Status Current Every Day Smoker  . Packs/day: 1.00  . Years: 30.00  . Pack years: 30.00  . Types: Cigarettes  Smokeless Tobacco Never Used  Tobacco Comment   3 cigs per day    Allergies  Allergen Reactions  . Aspirin Swelling and Other (See Comments)    Facial swelling    Objective:  There were no vitals filed for this visit. There is no height or weight on file to calculate BMI. Constitutional Well developed. Well nourished.  Vascular Dorsalis pedis pulses palpable bilaterally. Posterior tibial pulses palpable bilaterally. Capillary refill normal to all digits.  No cyanosis or clubbing noted. Pedal hair growth normal.  Neurologic Normal speech.  Oriented to person, place, and time. Epicritic sensation to light touch grossly present bilaterally.  Dermatologic Nails well groomed and normal in appearance. No open wounds. No skin lesions.  Orthopedic: POP Right Hallux IPJ with crepitus on ROM.   Radiographs: Prior XR reviewed  Assessment:   1. Bone spur of toe    Plan:  Patient was evaluated and treated and all questions answered.  Exostosis Hallux IPJ -Discussed avoiding surgery given smoking status. -Encourages smoking cessation. -F/u should he quit smoking for possible surgical discusion  No follow-ups on file.

## 2019-02-12 ENCOUNTER — Ambulatory Visit
Admission: EM | Admit: 2019-02-12 | Discharge: 2019-02-12 | Disposition: A | Discharge status: Home / Self Care | Payer: Medicaid Other | Attending: Emergency Medicine | Admitting: Emergency Medicine

## 2019-02-12 ENCOUNTER — Other Ambulatory Visit: Payer: Self-pay

## 2019-02-12 ENCOUNTER — Emergency Department (HOSPITAL_COMMUNITY)
Admission: EM | Admit: 2019-02-12 | Discharge: 2019-02-12 | Disposition: A | Discharge status: Home / Self Care | Payer: Medicaid Other | Attending: Emergency Medicine | Admitting: Emergency Medicine

## 2019-02-12 ENCOUNTER — Encounter: Payer: Self-pay | Admitting: Emergency Medicine

## 2019-02-12 ENCOUNTER — Encounter (HOSPITAL_COMMUNITY): Payer: Self-pay

## 2019-02-12 ENCOUNTER — Emergency Department (HOSPITAL_COMMUNITY): Payer: Medicaid Other

## 2019-02-12 DIAGNOSIS — I10 Essential (primary) hypertension: Secondary | ICD-10-CM | POA: Diagnosis not present

## 2019-02-12 DIAGNOSIS — F419 Anxiety disorder, unspecified: Secondary | ICD-10-CM | POA: Diagnosis not present

## 2019-02-12 DIAGNOSIS — Z79899 Other long term (current) drug therapy: Secondary | ICD-10-CM | POA: Insufficient documentation

## 2019-02-12 DIAGNOSIS — E119 Type 2 diabetes mellitus without complications: Secondary | ICD-10-CM | POA: Insufficient documentation

## 2019-02-12 DIAGNOSIS — I2510 Atherosclerotic heart disease of native coronary artery without angina pectoris: Secondary | ICD-10-CM | POA: Diagnosis not present

## 2019-02-12 DIAGNOSIS — R0789 Other chest pain: Secondary | ICD-10-CM | POA: Diagnosis present

## 2019-02-12 DIAGNOSIS — F319 Bipolar disorder, unspecified: Secondary | ICD-10-CM | POA: Diagnosis not present

## 2019-02-12 DIAGNOSIS — E785 Hyperlipidemia, unspecified: Secondary | ICD-10-CM | POA: Insufficient documentation

## 2019-02-12 DIAGNOSIS — R079 Chest pain, unspecified: Secondary | ICD-10-CM

## 2019-02-12 DIAGNOSIS — F17210 Nicotine dependence, cigarettes, uncomplicated: Secondary | ICD-10-CM | POA: Insufficient documentation

## 2019-02-12 LAB — BASIC METABOLIC PANEL
Anion gap: 9 (ref 5–15)
BUN: 32 mg/dL — ABNORMAL HIGH (ref 6–20)
CO2: 29 mmol/L (ref 22–32)
Calcium: 8.9 mg/dL (ref 8.9–10.3)
Chloride: 96 mmol/L — ABNORMAL LOW (ref 98–111)
Creatinine, Ser: 1.78 mg/dL — ABNORMAL HIGH (ref 0.61–1.24)
GFR calc Af Amer: 49 mL/min — ABNORMAL LOW (ref >60)
GFR calc non Af Amer: 42 mL/min — ABNORMAL LOW (ref >60)
Glucose, Bld: 154 mg/dL — ABNORMAL HIGH (ref 70–99)
Potassium: 4.1 mmol/L (ref 3.5–5.1)
Sodium: 134 mmol/L — ABNORMAL LOW (ref 135–145)

## 2019-02-12 LAB — TROPONIN I (HIGH SENSITIVITY)
Troponin I (High Sensitivity): 9 ng/L (ref <18)
Troponin I (High Sensitivity): 9 ng/L (ref <18)
Troponin I (High Sensitivity): 8 ng/L (ref <18)

## 2019-02-12 LAB — CBC
HCT: 36.6 % — ABNORMAL LOW (ref 39.0–52.0)
Hemoglobin: 13 g/dL (ref 13.0–17.0)
MCH: 31 pg (ref 26.0–34.0)
MCHC: 35.5 g/dL (ref 30.0–36.0)
MCV: 87.1 fL (ref 80.0–100.0)
Platelets: 199 10*3/uL (ref 150–400)
RBC: 4.2 MIL/uL — ABNORMAL LOW (ref 4.22–5.81)
RDW: 11.8 % (ref 11.5–15.5)
WBC: 4.7 10*3/uL (ref 4.0–10.5)
nRBC: 0 % (ref 0.0–0.2)

## 2019-02-12 MED ORDER — MORPHINE SULFATE (PF) 4 MG/ML IV SOLN: 4.0000 mg | Freq: Once | INTRAVENOUS | Status: DC

## 2019-02-12 MED ORDER — FENTANYL CITRATE (PF) 100 MCG/2ML IJ SOLN
50.0000 ug | Freq: Once | INTRAMUSCULAR | Status: AC
  Administered 2019-02-12: 50 ug via INTRAVENOUS
  Filled 2019-02-12: qty 2

## 2019-02-12 MED ORDER — SODIUM CHLORIDE 0.9 % IV BOLUS
1000.0000 mL | Freq: Once | INTRAVENOUS | Status: AC
  Administered 2019-02-12: 1000 mL via INTRAVENOUS

## 2019-02-12 NOTE — ED Triage Notes (Signed)
Pt reports cp for past couple of days.  Saw pcp and told pt to go get an ekg.  Pt went to [facility] and they were concerned about some ST elevation in one of the leads so  they sent him here for further evaluation.  Pt says has been doing strenuous activity recently.  Has history of MI x 2.

## 2019-02-12 NOTE — Discharge Instructions (Signed)
Your work-up today did not suggest that you are having a heart attack.  Call your cardiologist for reevaluation of your symptoms and for further recommendations.  Return to the emergency department if any concerning signs or symptoms develop such as worsening pain, persistent shortness of breath, loss of consciousness, persistent vomiting, or high fevers.

## 2019-02-12 NOTE — ED Provider Notes (Signed)
[facility]   [id number] 02/12/19 Arrival Time: 64   CC: CHEST PAIN  SUBJECTIVE:  Melvin Taylor is a 53 y.o. male hx significant for HTN, MI x 2, DM, bipolar 1 disorder, CAD, depression, sleep apnea, schizophrenia, and seizures, who presents with complaint of intermittent chest pain that began 2-3 days ago.  Denies a precipitating event, trauma, recent lower respiratory tract, or strenuous upper body activities.  States he was watching TV prior to symptoms.  Does admit to carry groceries a few weeks ago.  Localizes chest pain to the left chest.  Describes as worsening, that is intermittent (with episodes lasting 15-20 minutes) and throbbing in character.  Rates pain as 7-8/10.   Has NOT tried OTC medications without relief.  Tried nitro without relief.  Symptoms made worse with exertion and being in the heat.  Denies radiating symptoms.  Reports previous symptoms in the past with past heart attacks.  Complains of lightheadedness, intermittent SOB, and increased urination.  Denies fever, chills, dizziness, palpitations, tachycardia, nausea, vomiting, abdominal pain, changes in bowel habits, diaphoresis, numbness/tingling in extremities, peripheral edema, or anxiety.    Denies calf pain or swelling, recent long travel, recent surgery, malignancy, hormone use, or previous blood clot  Denies recent recreational drug use.  Does admit to having 1 beer 2 days ago.    Tobacco use: <1PPD x 40 years  ROS: As per HPI. Past Medical History:  Diagnosis Date   Anxiety    Bipolar 1 disorder ([place])    Cervical radiculopathy    Chest pain    Chronic back pain    Chronic chest wall pain    Chronic neck pain    Coronary artery disease    Depression    Diabetes mellitus    Type II   Hallucinations    "long history of them"   Headache(784.0)    History of gout    HTN (hypertension)    Insomnia    Myocardial infarction ([place]) 2017   Neuropathy    Pain management      Polysubstance abuse ([place])    Right leg pain    chronic   Schizophrenia ([place])    Seizures ([place])    last sz between July 5-9th, 2016; epilepsy   Shortness of breath dyspnea    with exertion   Sleep apnea    Transfusion of blood product refused for religious reason    Past Surgical History:  Procedure Laterality Date   ANTERIOR CERVICAL DECOMP/DISCECTOMY FUSION N/A 12/14/2015   Procedure: ANTERIOR CERVICAL DECOMPRESSION/DISCECTOMY FUSION CERVICAL FIVE -SIX;  Surgeon: Earnie Larsson, MD;  Location: [facility];  Service: Neurosurgery;  Laterality: N/A;   BIOPSY  05/07/2015   Procedure: BIOPSY (Gastric);  Surgeon: [name], MD;  Location: [facility];  Service: Endoscopy;;   CARDIAC CATHETERIZATION N/A 03/07/2016   Procedure: Left Heart Cath and Coronary Angiography;  Surgeon: Belva Crome, MD;  Location: [facility];  Service: Cardiovascular;  Laterality: N/A;   COLONOSCOPY WITH PROPOFOL N/A 05/07/2015   OIN:OMVEHMCNOB diverticulosis, multiple colon polyps removed, tubular adenoma, serrated colon polyp. Next colonoscopy October 2019   COLONOSCOPY WITH PROPOFOL N/A 08/02/2018   Procedure: COLONOSCOPY WITH PROPOFOL;  Surgeon: [name], MD;  Location: [facility];  Service: Endoscopy;  Laterality: N/A;  12:00pm   CORONARY ARTERY BYPASS GRAFT N/A 03/28/2016   Procedure: CORONARY ARTERY BYPASS GRAFTING (CABG) x 5 USING GREATER SAPHENOUS VEIN;  Surgeon: [name], MD;  Location: [facility]  OR;  Service: Open Heart Surgery;  Laterality: N/A;   ENDOVEIN HARVEST OF GREATER SAPHENOUS VEIN Right 03/28/2016   Procedure: ENDOVEIN HARVEST OF GREATER SAPHENOUS VEIN;  Surgeon: [name], MD;  Location: [facility];  Service: Open Heart Surgery;  Laterality: Right;   ESOPHAGEAL DILATION N/A 05/07/2015   Procedure: ESOPHAGEAL DILATION WITH 56FR MALONEY DILATOR;  Surgeon: [name], MD;  Location: [facility];  Service: Endoscopy;  Laterality: N/A;   ESOPHAGOGASTRODUODENOSCOPY (EGD) WITH  PROPOFOL N/A 05/07/2015   RMR: Status post dilation of normal esophagus. Gastritis.   KNEE SURGERY Left    arthroscopy   MANDIBLE FRACTURE SURGERY     POLYPECTOMY  05/07/2015   Procedure: POLYPECTOMY (Hepatic Flexure, Distal Transverse Colon, Rectal);  Surgeon: [name], MD;  Location: [facility];  Service: Endoscopy;;   TEE WITHOUT CARDIOVERSION N/A 03/28/2016   Procedure: TRANSESOPHAGEAL ECHOCARDIOGRAM (TEE);  Surgeon: [name], MD;  Location: [facility];  Service: Open Heart Surgery;  Laterality: N/A;   TONSILLECTOMY     Allergies  Allergen Reactions   Aspirin Swelling and Other (See Comments)    Facial swelling    No current facility-administered medications on file prior to encounter.    Current Outpatient Medications on File Prior to Encounter  Medication Sig Dispense Refill   ACCU-CHEK AVIVA PLUS test strip USE AS DIRECTED TO TEST 4 TIMES DAILY. 150 each 5   albuterol (PROVENTIL HFA;VENTOLIN HFA) 108 (90 Base) MCG/ACT inhaler Inhale 1-2 puffs into the lungs every 6 (six) hours as needed for wheezing or shortness of breath.     amLODipine (NORVASC) 10 MG tablet Take 1 tablet (10 mg total) by mouth daily. Pt needs appt to continue to receive refills 90 tablet 1   atorvastatin (LIPITOR) 40 MG tablet Take 1 tablet (40 mg total) by mouth daily. 90 tablet 3   clopidogrel (PLAVIX) 75 MG tablet Take by mouth.     diazepam (VALIUM) 5 MG tablet Take 5 mg by mouth 2 (two) times daily as needed for anxiety. Can take 2 tablets at bedtime     diclofenac sodium (VOLTAREN) 1 % GEL Apply 2 g topically 4 (four) times daily. Rub into affected area of foot 2 to 4 times daily 100 g 2   divalproex (DEPAKOTE) 500 MG DR tablet Take 3 tablets (1,500 mg total) by mouth 2 (two) times daily. 540 tablet 4   escitalopram (LEXAPRO) 10 MG tablet Take 10 mg by mouth daily.     Insulin Glargine (LANTUS SOLOSTAR) 100 UNIT/ML Solostar Pen Inject 30 Units into the skin at bedtime. 15 mL 0    levETIRAcetam (KEPPRA) 750 MG tablet Take 2 tablets (1,500 mg total) by mouth 2 (two) times daily. 360 tablet 4   lisinopril (PRINIVIL,ZESTRIL) 10 MG tablet Take 1 tablet (10 mg total) by mouth daily. 90 tablet 3   metoprolol tartrate (LOPRESSOR) 25 MG tablet Take 0.5 tablets (12.5 mg total) by mouth 2 (two) times daily. 90 tablet 3   oxyCODONE-acetaminophen (PERCOCET/ROXICET) 5-325 MG tablet Take 1 tablet by mouth every 6 (six) hours as needed. 20 tablet 0   RANEXA 500 MG 12 hr tablet TAKE (1) TABLET BY MOUTH TWICE DAILY. 60 tablet 11   risperiDONE (RISPERDAL) 2 MG tablet Take 2 mg by mouth at bedtime.     [DISCONTINUED] paliperidone (INVEGA) 6 MG 24 hr tablet Take 12 mg by mouth daily.     Social History   Socioeconomic History   Marital status: Single  Spouse name: Not on file   Number of children: Not on file   Years of education: 11th grade   Highest education level: Not on file  Occupational History   Occupation: unemployed    Employer: NOT EMPLOYED  Social Designer, fashion/clothing strain: Not on file   Food insecurity    Worry: Not on file    Inability: Not on file   Transportation needs    Medical: Not on file    Non-medical: Not on file  Tobacco Use   Smoking status: Current Every Day Smoker    Packs/day: 1.00    Years: 30.00    Pack years: 30.00    Types: Cigarettes   Smokeless tobacco: Never Used   Tobacco comment: 3 cigs per day  Substance and Sexual Activity   Alcohol use: No   Drug use: No   Sexual activity: Not Currently  Lifestyle   Physical activity    Days per week: Not on file    Minutes per session: Not on file   Stress: Not on file  Relationships   Social connections    Talks on phone: Not on file    Gets together: Not on file    Attends religious service: Not on file    Active member of club or organization: Not on file    Attends meetings of clubs or organizations: Not on file    Relationship status: Not on file    Intimate partner violence    Fear of current or ex partner: Not on file    Emotionally abused: Not on file    Physically abused: Not on file    Forced sexual activity: Not on file  Other Topics Concern   Not on file  Social History Narrative   Lives alone   Drinks a cup of coffee a day   Family History  Problem Relation Age of Onset   Diabetes Father    Hypertension Father    Arthritis Other    Diabetes Other    Asthma Other    Stroke Sister      OBJECTIVE:  Vitals:   02/12/19 1155  BP: 125/88  Pulse: 75  Resp: 20  Temp: 98 F (36.7 C)  SpO2: 98%    General appearance: alert; no distress Eyes: PERRLA; EOMI; conjunctiva normal HENT: normocephalic; atraumatic; oropharynx clear Neck: supple, no carotid bruits Lungs: clear to auscultation bilaterally without adventitious breath sounds Heart: regular rate and rhythm.  Clear S1 and S2 without rubs, gallops, or murmur. Radial pulse 2+ bilaterally Abdomen: soft, non-tender; bowel sounds normal;  no guarding Extremities: no cyanosis or edema; symmetrical with no gross deformities Skin: warm and dry Psychological: alert and cooperative; normal mood and affect  ECG: Orders placed or performed during the hospital encounter of 02/12/19   ED EKG   ED EKG   EKG normal sinus rhythm with slight ST elevation is V3, however, not in consecutive leads.  Changed from past EKG.  No narrowing or widening of the QRS complexes.  Reviewed EKG with DR. Hagler.   ASSESSMENT & PLAN:  1. Chest pain, unspecified type     No orders of the defined types were placed in this encounter.  Discussed patient case with Dr. Mannie Stabile.  Recommended further evaluation and management in the ED for chest pain.    Recommending further evaluation and management in the ED for chest pain.  Declines aspirin and nitro in office.  EMS called for transport.  Lestine Box, PA-C 02/12/19 1344

## 2019-02-12 NOTE — ED Triage Notes (Signed)
Pt states that he has chest discomfort for past couple weeks, pt states that he has also smelled blood. pts pcp states ekg not working there and request he be further evaluated, pt has h/o MI has not contacted cardiologist

## 2019-02-12 NOTE — ED Provider Notes (Signed)
[facility] EMERGENCY DEPARTMENT Provider Note   CSN: [id number] Arrival date & time: 02/12/19  1314    History   Chief Complaint Chief Complaint  Patient presents with  . Chest Pain    HPI Melvin Taylor is a 53 y.o. 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  He reports that symptoms began on Friday while he was carrying groceries into his home.  He states the pain lasted for around 15 minutes but resolved with rest.  Since then he has had intermittent pain primarily associated with exertion but for the last 2 days has also occurred at rest.  The pain is sharp, left-sided, typically lasts 15 to 20 minutes before resolving.  It is associated with shortness of breath and nausea but no lightheadedness, diaphoresis, or syncope.  No abdominal pain.  At times it radiates to the left upper extremity and left side of the neck.  Seems to worsen in the heat he says.  No fevers, cough, leg swelling.  He has been taking his home medications without relief.  He last had nitroglycerin 2 days ago.  He states that the nitroglycerin sometimes helps with his pain and sometimes does not.  He is a current smoker of approximately half a pack of cigarettes daily, denies recreational drug use or excessive alcohol intake.   Per last office visit with his cardiologist in December 2019:  The patient presents for follow-up of coronary artery disease. He underwent 5 vessel CABG on 03/28/16.  Ever since bypass surgery, he has struggled with musculoskeletal chest wall and left arm pain. It is exacerbated with movement and lifting heavy objects. He could not afford cardiac rehabilitation.  He underwent anterior cervical spine decompression surgery in May 2017. This was performed by Dr. Earnie Larsson.He is still  trying to get in with a pain management clinic.     The history is provided by the patient.    Past Medical History:  Diagnosis Date  . Anxiety   . Bipolar 1 disorder ([place])   . Cervical radiculopathy   . Chest pain   . Chronic back pain   . Chronic chest wall pain   . Chronic neck pain   . Coronary artery disease   . Depression   . Diabetes mellitus    Type II  . Hallucinations    "long history of them"  . Headache(784.0)   . History of gout   . HTN (hypertension)   . Insomnia   . Myocardial infarction ([place]) 2017  . Neuropathy   . Pain management   . Polysubstance abuse ([place])   . Right leg pain    chronic  . Schizophrenia ([place])   . Seizures ([place])    last sz between July 5-9th, 2016; epilepsy  . Shortness of breath dyspnea    with exertion  . Sleep apnea   . Transfusion of blood product refused for religious reason     Patient Active Problem List   Diagnosis Date Noted  . Anemia, chronic disease 07/04/2018  . Other specified diseases of the digestive system 10/30/2017  . CAD (coronary artery disease) 03/28/2016  . Chest pain 03/21/2016  . Hyperlipidemia 03/11/2016  . Ischemic chest pain ([place])   . NSTEMI (non-ST elevated myocardial infarction) ([place])   . Coronary atherosclerosis of native coronary artery 03/03/2016  . Cervical  spinal stenosis 12/14/2015  . Loss of weight 08/25/2015  . Essential hypertension, benign 07/23/2015  . Personal history of noncompliance with medical treatment, presenting hazards to health 07/23/2015  . Abdominal pain 06/08/2015  . Constipation 06/08/2015  . History of colonic polyps   . Diverticulosis of colon without hemorrhage   . Mucosal abnormality of stomach   . Encounter for screening colonoscopy 04/16/2015  . Dysphagia 04/16/2015  . Partial symptomatic epilepsy with complex partial seizures, intractable, without status epilepticus ([place]) 11/10/2014  . Bipolar disorder, unspecified ([place]) 10/15/2014  . Schizophrenia ([place])  10/15/2014  . DM type 2 causing vascular disease ([place]) 06/29/2014  . Musculoskeletal pain 06/29/2014  . Hyperglycemia without ketosis 09/07/2013  . 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 Other internal derangements of unspecified knee 01/13/2011  . Knee pain 12/28/2010  . Medial meniscus, posterior horn derangement 12/28/2010    Past Surgical History:  Procedure Laterality Date  . ANTERIOR CERVICAL DECOMP/DISCECTOMY FUSION N/A 12/14/2015   Procedure: ANTERIOR CERVICAL DECOMPRESSION/DISCECTOMY FUSION CERVICAL FIVE -SIX;  Surgeon: Earnie Larsson, MD;  Location: [place] [facility]UR[facility] ORS;  Service: Neurosurgery;  Laterality: N/A;  . BIOPSY  05/07/2015   Procedure: BIOPSY (Gastric);  Surgeon: [name], MD;  Location: [facility] OR[facility];  Service: Endoscopy;;  . CARDIAC CATHETERIZATION N/A 03/07/2016   Procedure: Left Heart Cath and Coronary Angiography;  Surgeon: Belva Crome, MD;  Location: [place] CV LAB;  Service: Cardiovascular;  Laterality: N/A;  . COLONOSCOPY WITH PROPOFOL N/A 05/07/2015   NGE:[id number] diverticulosis, multiple colon polyps removed, tubular adenoma, serrated colon polyp. Next colonoscopy October 2019  . COLONOSCOPY WITH PROPOFOL N/A 08/02/2018   Procedure: COLONOSCOPY WITH PROPOFOL;  Surgeon: [name], MD;  Location: [facility];  Service: Endoscopy;  Laterality: N/A;  12:00pm  . CORONARY ARTERY BYPASS GRAFT N/A 03/28/2016   Procedure: CORONARY ARTERY BYPASS GRAFTING (CABG) x 5 USING GREATER SAPHENOUS VEIN;  Surgeon: [name], MD;  Location: [place] OR;  Service: Open Heart Surgery;  Laterality: N/A;  . ENDOVEIN HARVEST OF GREATER SAPHENOUS VEIN Right 03/28/2016   Procedure: ENDOVEIN HARVEST OF GREATER SAPHENOUS VEIN;  Surgeon: [name], MD;  Location: [facility];  Service: Open Heart Surgery;  Laterality: Right;  . ESOPHAGEAL DILATION N/A 05/07/2015   Procedure: ESOPHAGEAL DILATION WITH 56FR MALONEY DILATOR;  Surgeon: [name], MD;  Location: [facility];  Service:  Endoscopy;  Laterality: N/A;  . ESOPHAGOGASTRODUODENOSCOPY (EGD) WITH PROPOFOL N/A 05/07/2015   RMR: Status post dilation of normal esophagus. Gastritis.  Marland Kitchen KNEE SURGERY Left    arthroscopy  . MANDIBLE FRACTURE SURGERY    . POLYPECTOMY  05/07/2015   Procedure: POLYPECTOMY (Hepatic Flexure, Distal Transverse Colon, Rectal);  Surgeon: [name], MD;  Location: [facility];  Service: Endoscopy;;  . TEE WITHOUT CARDIOVERSION N/A 03/28/2016   Procedure: TRANSESOPHAGEAL ECHOCARDIOGRAM (TEE);  Surgeon: [name], MD;  Location: [facility];  Service: Open Heart Surgery;  Laterality: N/A;  . TONSILLECTOMY          Home Medications    Prior to Admission medications   Medication Sig Start Date End Date Taking? Authorizing Provider  ACCU-CHEK AVIVA PLUS test strip USE AS DIRECTED TO TEST 4 TIMES DAILY. 07/28/17  Yes Nida, Marella Chimes, MD  albuterol (PROVENTIL HFA;VENTOLIN HFA) 108 (90 Base) MCG/ACT inhaler Inhale 1-2 puffs into the lungs every 6 (six) hours as needed for wheezing or shortness of breath.   Yes [provider]  amLODipine (NORVASC) 10 MG  tablet Take 1 tablet (10 mg total) by mouth daily. Pt needs appt to continue to receive refills 11/15/18  Yes Herminio Commons, MD  atorvastatin (LIPITOR) 40 MG tablet Take 1 tablet (40 mg total) by mouth daily. 07/04/18 02/12/19 Yes Herminio Commons, MD  clopidogrel (PLAVIX) 75 MG tablet Take 75 mg by mouth daily.    Yes [provider]  diazepam (VALIUM) 5 MG tablet Take 5 mg by mouth 2 (two) times daily as needed for anxiety. Can take 2 tablets at bedtime   Yes [provider]  diclofenac sodium (VOLTAREN) 1 % GEL Apply 2 g topically 4 (four) times daily. Rub into affected area of foot 2 to 4 times daily 10/25/18  Yes Trula Slade, DPM  divalproex (DEPAKOTE) 500 MG DR tablet Take 3 tablets (1,500 mg total) by mouth 2 (two) times daily. 08/21/18  Yes Penumalli, Vikram R, MD  escitalopram (LEXAPRO) 10 MG tablet  Take 10 mg by mouth daily.   Yes [provider]  Insulin Glargine (LANTUS SOLOSTAR) 100 UNIT/ML Solostar Pen Inject 30 Units into the skin at bedtime. 03/03/17  Yes Cassandria Anger, MD  levETIRAcetam (KEPPRA) 750 MG tablet Take 2 tablets (1,500 mg total) by mouth 2 (two) times daily. 08/21/18  Yes Penumalli, Earlean Polka, MD  lisinopril (PRINIVIL,ZESTRIL) 10 MG tablet Take 1 tablet (10 mg total) by mouth daily. 07/04/18 02/12/19 Yes Herminio Commons, MD  metoprolol tartrate (LOPRESSOR) 25 MG tablet Take 0.5 tablets (12.5 mg total) by mouth 2 (two) times daily. 04/23/18 02/12/19 Yes Lenze, Michele M, PA-C  RANEXA 500 MG 12 hr tablet TAKE (1) TABLET BY MOUTH TWICE DAILY. 08/07/18  Yes Herminio Commons, MD  risperiDONE (RISPERDAL) 2 MG tablet Take 2 mg by mouth at bedtime.   Yes [provider]  paliperidone (INVEGA) 6 MG 24 hr tablet Take 12 mg by mouth daily.  02/12/19  [provider]    Family History Family History  Problem Relation Age of Onset  . Diabetes Father   . Hypertension Father   . Arthritis Other   . Diabetes Other   . Asthma Other   . Stroke Sister     Social History Social History   Tobacco Use  . Smoking status: Current Every Day Smoker    Packs/day: 1.00    Years: 30.00    Pack years: 30.00    Types: Cigarettes  . Smokeless tobacco: Never Used  . Tobacco comment: 3 cigs per day  Substance Use Topics  . Alcohol use: No  . Drug use: No     Allergies   Aspirin   Review of Systems Review of Systems  Constitutional: Negative for chills, diaphoresis and fever.  Respiratory: Positive for shortness of breath. Negative for cough.   Cardiovascular: Positive for chest pain. Negative for palpitations.  Gastrointestinal: Positive for nausea. Negative for abdominal pain and vomiting.  Genitourinary: Negative for dysuria and hematuria.  Neurological: Negative for syncope.  All other systems reviewed and are negative.    Physical Exam  Updated Vital Signs BP 113/82   Pulse 65   Temp 98.1 F (36.7 C) (Oral)   Resp 14   Ht 5\' 7"  (1.702 m)   Wt 68 kg   SpO2 100%   BMI 23.49 kg/m   Physical Exam Vitals signs and nursing note reviewed.  Constitutional:      General: He is not in acute distress.    Appearance: He is well-developed.  HENT:  Head: Normocephalic and atraumatic.  Eyes:     General:        Right eye: No discharge.        Left eye: No discharge.     Conjunctiva/sclera: Conjunctivae normal.  Neck:     Musculoskeletal: Normal range of motion and neck supple.     Vascular: No JVD.     Trachea: No tracheal deviation.  Cardiovascular:     Rate and Rhythm: Normal rate and regular rhythm.     Pulses:          Radial pulses are 1+ on the right side and 1+ on the left side.       Dorsalis pedis pulses are 1+ on the right side and 1+ on the left side.       Posterior tibial pulses are 1+ on the right side and 1+ on the left side.     Comments: Homans sign absent bilaterally Pulmonary:     Effort: Pulmonary effort is normal.     Comments: Globally diminished breath sounds.  Speaking full sentences without difficulty. Chest:     Chest wall: Tenderness present.     Comments: Median sternotomy scar noted.  Mild left anterior chest wall tenderness with no deformity, crepitus ecchymosis or flail segment Abdominal:     General: There is no distension.  Musculoskeletal:     Right lower leg: He exhibits no tenderness. No edema.     Left lower leg: He exhibits no tenderness. No edema.  Skin:    General: Skin is warm and dry.     Findings: No erythema.  Neurological:     Mental Status: He is alert.  Psychiatric:        Behavior: Behavior normal.      ED Treatments / Results  Labs (all labs ordered are listed, but only abnormal results are displayed) Labs Reviewed  BASIC METABOLIC PANEL - Abnormal; Notable for the following components:      Result Value   Sodium 134 (*)    Chloride 96 (*)     Glucose, Bld 154 (*)    BUN 32 (*)    Creatinine, Ser 1.78 (*)    GFR calc non Af Amer 42 (*)    GFR calc Af Amer 49 (*)    All other components within normal limits  CBC - Abnormal; Notable for the following components:   RBC 4.20 (*)    HCT 36.6 (*)    All other components within normal limits  TROPONIN I (HIGH SENSITIVITY)  TROPONIN I (HIGH SENSITIVITY)  TROPONIN I (HIGH SENSITIVITY)    EKG ED ECG REPORT   Date: 02/12/2019  Rate: 67  Rhythm: normal sinus rhythm  QRS Axis: normal  Intervals: normal  ST/T Wave abnormalities: normal  Conduction Disutrbances:none  Narrative Interpretation:   Old EKG Reviewed: unchanged  I have personally reviewed the EKG tracing and agree with the computerized printout as noted.   Radiology Dg Chest 2 View  Result Date: 02/12/2019 CLINICAL DATA:  Left chest pain for the past 2 days. Smoker. EXAM: CHEST - 2 VIEW COMPARISON:  06/09/2018. FINDINGS: Normal sized heart. Clear lungs with normal vascularity. Stable post CABG changes. Cervical spine fixation hardware and mild lower thoracic spine degenerative changes. IMPRESSION: No acute abnormality. Electronically Signed   By: Claudie Revering M.D.   On: 02/12/2019 14:10    Procedures Procedures (including critical care time)  Medications Ordered in ED Medications  sodium chloride 0.9 % bolus 1,000 mL (  0 mLs Intravenous Stopped 02/12/19 1652)  fentaNYL (SUBLIMAZE) injection 50 mcg (50 mcg Intravenous Given 02/12/19 1708)     Initial Impression / Assessment and Plan / ED Course  I have reviewed the triage vital signs and the nursing notes.  Pertinent labs & imaging results that were available during my care of the patient were reviewed by me and considered in my medical decision making (see chart for details).  Patient presenting for evaluation of left-sided chest pains intermittently for the last 5 days.  Reports is consistent with his usual chest pains.  He is afebrile, vital signs are stable.   He is nontoxic in appearance.  Pain is reproducible on palpation and per her last note with his cardiologist he has been struggling with musculoskeletal chest wall pain ever since his bypass surgery.  Symptoms today appear consistent with that.  His EKG today shows no acute ischemic abnormalities.  Serial troponins are negative which is reassuring that he is not actively having an MI.  Remainder blood work today reviewed by me shows some renal insufficiency at patient's baseline, no anemia, no metabolic derangements.  Chest x-ray shows no acute cardiopulmonary abnormalities with no evidence of pneumonia or pleural effusion.  Pain was controlled in the ED.  Doubt PE, dissection, cardiac tamponade, soft to rupture, pneumothorax, pneumonia.  On reevaluation patient resting comfortably in no apparent distress.  He feels comfortable with discharge home.  Encouraged him to follow-up with his cardiologist in the next 24 to 48 hours.  Discussed strict ED return precautions. Patient verbalized understanding of and agreement with plan and is safe for discharge home at this time.  Discussed with Dr. Laverta Baltimore who agrees with assessment and plan at this time.  Final Clinical Impressions(s) / ED Diagnoses   Final diagnoses:  Chest wall pain  Intermittent left-sided chest pain    ED Discharge Orders    None       Debroah Baller 02/12/19 1723    Margette Fast, MD 02/13/19 [phone]

## 2019-02-12 NOTE — ED Notes (Signed)
[facility] called for transport, pt stable

## 2019-02-12 NOTE — Discharge Instructions (Signed)
Recommending further evaluation and management in the ED for chest pain.  Declines aspirin and nitro in office.  EMS called for transport.

## 2019-02-20 ENCOUNTER — Encounter: Payer: Self-pay | Admitting: *Deleted

## 2019-02-20 ENCOUNTER — Other Ambulatory Visit: Payer: Self-pay

## 2019-02-20 ENCOUNTER — Ambulatory Visit (INDEPENDENT_AMBULATORY_CARE_PROVIDER_SITE_OTHER): Payer: Medicaid Other | Admitting: Student

## 2019-02-20 ENCOUNTER — Encounter: Payer: Self-pay | Admitting: Student

## 2019-02-20 VITALS — BP 140/80 | HR 87 | Ht 67.0 in | Wt 149.0 lb

## 2019-02-20 DIAGNOSIS — IMO0001 Reserved for inherently not codable concepts without codable children: Secondary | ICD-10-CM

## 2019-02-20 DIAGNOSIS — E119 Type 2 diabetes mellitus without complications: Secondary | ICD-10-CM

## 2019-02-20 DIAGNOSIS — R072 Precordial pain: Secondary | ICD-10-CM

## 2019-02-20 DIAGNOSIS — Z794 Long term (current) use of insulin: Secondary | ICD-10-CM

## 2019-02-20 DIAGNOSIS — I25118 Atherosclerotic heart disease of native coronary artery with other forms of angina pectoris: Secondary | ICD-10-CM

## 2019-02-20 DIAGNOSIS — I10 Essential (primary) hypertension: Secondary | ICD-10-CM

## 2019-02-20 DIAGNOSIS — Z720 Tobacco use: Secondary | ICD-10-CM

## 2019-02-20 DIAGNOSIS — E785 Hyperlipidemia, unspecified: Secondary | ICD-10-CM

## 2019-02-20 MED ORDER — NITROGLYCERIN 0.4 MG SL SUBL: 0.4000 mg | SUBLINGUAL_TABLET | SUBLINGUAL | 3 refills | Status: DC | PRN

## 2019-02-20 NOTE — Patient Instructions (Signed)
Medication Instructions:  Your physician recommends that you continue on your current medications as directed. Please refer to the Current Medication list given to you today.  I have refilled your Nitro   If you need a refill on your cardiac medications before your next appointment, please call your pharmacy.   Lab work: NONE   If you have labs (blood work) drawn today and your tests are completely normal, you will receive your results only by: Marland Kitchen MyChart Message (if you have MyChart) OR . A paper copy in the mail If you have any lab test that is abnormal or we need to change your treatment, we will call you to review the results.  Testing/Procedures: Your physician has requested that you have a lexiscan myoview. For further information please visit [email or web address]. Please follow instruction sheet, as given.    Follow-Up: At [facility], you and your health needs are our priority.  As part of our continuing mission to provide you with exceptional heart care, we have created designated Provider Care Teams.  These Care Teams include your primary Cardiologist (physician) and Advanced Practice Providers (APPs -  Physician Assistants and Nurse Practitioners) who all work together to provide you with the care you need, when you need it. You will need a follow up appointment in 3 months.  Please call our office 2 months in advance to schedule this appointment.  You may see Kate Sable, MD or one of the following Advanced Practice Providers on your designated Care Team:   Bernerd Pho, PA-C [facility]) . Ermalinda Barrios, PA-C ([place])  Any Other Special Instructions Will Be Listed Below (If Applicable). Thank you for choosing [facility]!

## 2019-02-20 NOTE — Progress Notes (Signed)
Cardiology Office Note    Date:  02/20/2019   ID:  NAPOLEAN SIA, DOB 04-10-1966, MRN [id number]  PCP:  Rosita Fire, MD  Cardiologist: Kate Sable, MD    Chief Complaint  Patient presents with   Follow-up    recent Emergency Dept visit    History of Present Illness:    Melvin Taylor is a 53 y.o. male with past medical history of CAD (s/p CABG in 03/2016 with LIMA-LAD, SVG-D1, SVG-RCA, and Seq SVG-mid and distal OM), HTN, HLD, IDDM, OSA, and tobacco use who presents to the office today for follow-up from an Emergency Department visit.   He was last examined by Dr. Bronson Ing in 07/2018 and had been evaluated un the ED multiple times for chest pain, most consistent with MSK chest wall pain. He was continued on Plavix (due to being allergic to ASA), Lopressor, and Ranexa with Atorvastatin being restarted and Lisinopril being increased to 10mg  daily given elevated BP. Was informed to follow-up with Cardiology on an annual basis.   In the interim, he presented to [facility] ED on 02/12/2019 for evaluation of sharp chest pain for the past 2-3 days which could occur at rest or with activity. His presenting symptoms were thought to be most consistent with his prior chest wall pain. Labs showed negative HS Troponin values at 9.00 and 8.00. EKG showed NSR, HR 67, with LAFB and no acute ST abnormalities when compared to his prior tracings. Outpatient Cardiology follow-up was recommended.   In talking with the patient today, he reports having episodes of sharp chest discomfort for the past 2 to 3 weeks which can occur at rest or with activity. Symptoms typically last for 10 to 20 minutes and spontaneously resolve. He has tried taking sublingual nitroglycerin with some improvement in his symptoms. He reports that this feels different from his chest wall pain that he was experiencing after CABG and resembles his prior MI's. He denies any associated dyspnea on exertion or palpitations. No  recent orthopnea, PND, or lower extremity edema.  He does continue to smoke approximately 0.5 ppd.    Past Medical History:  Diagnosis Date   Anxiety    Bipolar 1 disorder ([place])    Cervical radiculopathy    Chest pain    Chronic back pain    Chronic chest wall pain    Chronic neck pain    Coronary artery disease    a. s/p CABG in 03/2016 with LIMA-LAD, SVG-D1, SVG-RCA, and Seq SVG-mid and distal OM   Depression    Diabetes mellitus    Type II   Hallucinations    "long history of them"   Headache(784.0)    History of gout    HTN (hypertension)    Insomnia    Myocardial infarction ([place]) 2017   Neuropathy    Pain management    Polysubstance abuse ([place])    Right leg pain    chronic   Schizophrenia ([place])    Seizures ([place])    last sz between July 5-9th, 2016; epilepsy   Shortness of breath dyspnea    with exertion   Sleep apnea    Transfusion of blood product refused for religious reason     Past Surgical History:  Procedure Laterality Date   ANTERIOR CERVICAL DECOMP/DISCECTOMY FUSION N/A 12/14/2015   Procedure: ANTERIOR CERVICAL DECOMPRESSION/DISCECTOMY FUSION CERVICAL FIVE -SIX;  Surgeon: Earnie Larsson, MD;  Location: [facility];  Service: Neurosurgery;  Laterality: N/A;   BIOPSY  05/07/2015  Procedure: BIOPSY (Gastric);  Surgeon: [name], MD;  Location: [facility];  Service: Endoscopy;;   CARDIAC CATHETERIZATION N/A 03/07/2016   Procedure: Left Heart Cath and Coronary Angiography;  Surgeon: Belva Crome, MD;  Location: [facility];  Service: Cardiovascular;  Laterality: N/A;   COLONOSCOPY WITH PROPOFOL N/A 05/07/2015   IRC:VELFYBOFBP diverticulosis, multiple colon polyps removed, tubular adenoma, serrated colon polyp. Next colonoscopy October 2019   COLONOSCOPY WITH PROPOFOL N/A 08/02/2018   Procedure: COLONOSCOPY WITH PROPOFOL;  Surgeon: [name], MD;  Location: [facility];  Service: Endoscopy;  Laterality: N/A;  12:00pm    CORONARY ARTERY BYPASS GRAFT N/A 03/28/2016   Procedure: CORONARY ARTERY BYPASS GRAFTING (CABG) x 5 USING GREATER SAPHENOUS VEIN;  Surgeon: [name], MD;  Location: [facility] OR;  Service: Open Heart Surgery;  Laterality: N/A;   ENDOVEIN HARVEST OF GREATER SAPHENOUS VEIN Right 03/28/2016   Procedure: ENDOVEIN HARVEST OF GREATER SAPHENOUS VEIN;  Surgeon: [name], MD;  Location: [facility];  Service: Open Heart Surgery;  Laterality: Right;   ESOPHAGEAL DILATION N/A 05/07/2015   Procedure: ESOPHAGEAL DILATION WITH 56FR MALONEY DILATOR;  Surgeon: [name], MD;  Location: [facility];  Service: Endoscopy;  Laterality: N/A;   ESOPHAGOGASTRODUODENOSCOPY (EGD) WITH PROPOFOL N/A 05/07/2015   RMR: Status post dilation of normal esophagus. Gastritis.   KNEE SURGERY Left    arthroscopy   MANDIBLE FRACTURE SURGERY     POLYPECTOMY  05/07/2015   Procedure: POLYPECTOMY (Hepatic Flexure, Distal Transverse Colon, Rectal);  Surgeon: [name], MD;  Location: [facility];  Service: Endoscopy;;   TEE WITHOUT CARDIOVERSION N/A 03/28/2016   Procedure: TRANSESOPHAGEAL ECHOCARDIOGRAM (TEE);  Surgeon: [name], MD;  Location: [facility];  Service: Open Heart Surgery;  Laterality: N/A;   TONSILLECTOMY      Current Medications: Outpatient Medications Prior to Visit  Medication Sig Dispense Refill   ACCU-CHEK AVIVA PLUS test strip USE AS DIRECTED TO TEST 4 TIMES DAILY. 150 each 5   albuterol (PROVENTIL HFA;VENTOLIN HFA) 108 (90 Base) MCG/ACT inhaler Inhale 1-2 puffs into the lungs every 6 (six) hours as needed for wheezing or shortness of breath.     amLODipine (NORVASC) 10 MG tablet Take 1 tablet (10 mg total) by mouth daily. Pt needs appt to continue to receive refills 90 tablet 1   atorvastatin (LIPITOR) 40 MG tablet Take 1 tablet (40 mg total) by mouth daily. 90 tablet 3   clopidogrel (PLAVIX) 75 MG tablet Take 75 mg by mouth daily.      diazepam (VALIUM) 5 MG tablet Take 5 mg by mouth 2 (two) times  daily as needed for anxiety. Can take 2 tablets at bedtime     diclofenac sodium (VOLTAREN) 1 % GEL Apply 2 g topically 4 (four) times daily. Rub into affected area of foot 2 to 4 times daily 100 g 2   divalproex (DEPAKOTE) 500 MG DR tablet Take 3 tablets (1,500 mg total) by mouth 2 (two) times daily. 540 tablet 4   escitalopram (LEXAPRO) 10 MG tablet Take 10 mg by mouth daily.     Insulin Glargine (LANTUS SOLOSTAR) 100 UNIT/ML Solostar Pen Inject 30 Units into the skin at bedtime. 15 mL 0   levETIRAcetam (KEPPRA) 750 MG tablet Take 2 tablets (1,500 mg total) by mouth 2 (two) times daily. 360 tablet 4   lisinopril (PRINIVIL,ZESTRIL) 10 MG tablet Take 1 tablet (10 mg total) by mouth daily. 90 tablet 3   metoprolol  tartrate (LOPRESSOR) 25 MG tablet Take 0.5 tablets (12.5 mg total) by mouth 2 (two) times daily. 90 tablet 3   RANEXA 500 MG 12 hr tablet TAKE (1) TABLET BY MOUTH TWICE DAILY. 60 tablet 11   risperiDONE (RISPERDAL) 2 MG tablet Take 2 mg by mouth at bedtime.     No facility-administered medications prior to visit.      Allergies:   Aspirin   Social History   Socioeconomic History   Marital status: Single    Spouse name: Not on file   Number of children: Not on file   Years of education: 11th grade   Highest education level: Not on file  Occupational History   Occupation: unemployed    Fish farm manager: NOT EMPLOYED  Social Designer, fashion/clothing strain: Not on file   Food insecurity    Worry: Not on file    Inability: Not on file   Transportation needs    Medical: Not on file    Non-medical: Not on file  Tobacco Use   Smoking status: Current Every Day Smoker    Packs/day: 1.00    Years: 30.00    Pack years: 30.00    Types: Cigarettes   Smokeless tobacco: Never Used   Tobacco comment: 3 cigs per day  Substance and Sexual Activity   Alcohol use: No   Drug use: No   Sexual activity: Not Currently  Lifestyle   Physical activity    Days per  week: Not on file    Minutes per session: Not on file   Stress: Not on file  Relationships   Social connections    Talks on phone: Not on file    Gets together: Not on file    Attends religious service: Not on file    Active member of club or organization: Not on file    Attends meetings of clubs or organizations: Not on file    Relationship status: Not on file  Other Topics Concern   Not on file  Social History Narrative   Lives alone   Drinks a cup of coffee a day     Family History:  The patient's family history includes Arthritis in an other family member; Asthma in an other family member; Diabetes in his father and another family member; Hypertension in his father; Stroke in his sister.   Review of Systems:   Please see the history of present illness.     General:  No chills, fever, night sweats or weight changes.  Cardiovascular:  No dyspnea on exertion, edema, orthopnea, palpitations, paroxysmal nocturnal dyspnea. Positive for chest pain.  Dermatological: No rash, lesions/masses Respiratory: No cough, dyspnea Urologic: No hematuria, dysuria Abdominal:   No nausea, vomiting, diarrhea, bright red blood per rectum, melena, or hematemesis Neurologic:  No visual changes, wkns, changes in mental status. All other systems reviewed and are otherwise negative except as noted above.   Physical Exam:    VS:  BP 140/80 (BP Location: Left Arm)    Pulse 87    Ht 5\' 7"  (1.702 m)    Wt 149 lb (67.6 kg)    SpO2 98%    BMI 23.34 kg/m    General: Well developed, well nourished,male appearing in no acute distress. Head: Normocephalic, atraumatic, sclera non-icteric, no xanthomas, nares are without discharge.  Neck: No carotid bruits. JVD not elevated.  Lungs: Respirations regular and unlabored, without wheezes or rales.  Heart: Regular rate and rhythm. No S3 or S4.  No murmur,  no rubs, or gallops appreciated. Abdomen: Soft, non-tender, non-distended with normoactive bowel sounds. No  hepatomegaly. No rebound/guarding. No obvious abdominal masses. Msk:  Strength and tone appear normal for age. No joint deformities or effusions. Extremities: No clubbing or cyanosis. No lower extremity edema.  Distal pedal pulses are 2+ bilaterally. Neuro: Alert and oriented X 3. Moves all extremities spontaneously. No focal deficits noted. Psych:  Responds to questions appropriately with a normal affect. Skin: No rashes or lesions noted  Wt Readings from Last 3 Encounters:  02/20/19 149 lb (67.6 kg)  02/12/19 150 lb (68 kg)  08/21/18 165 lb 12.8 oz (75.2 kg)     Studies/Labs Reviewed:   EKG:  EKG is not ordered today. EKG from 02/12/2019 is reviewed which shows NSR, HR 67, with LAFB and no acute ST abnormalities when compared to his prior tracings.  Recent Labs: 04/04/2018: ALT 17 02/12/2019: BUN 32; Creatinine, Ser 1.78; Hemoglobin 13.0; Platelets 199; Potassium 4.1; Sodium 134   Lipid Panel    Component Value Date/Time   CHOL 172 07/20/2018 1002   TRIG 93 07/20/2018 1002   HDL 78 07/20/2018 1002   CHOLHDL 2.0 03/22/2016 0853   VLDL 7 03/22/2016 0853   LDLCALC 75 07/20/2018 1002    Additional studies/ records that were reviewed today include:   Cardiac Catheterization: 03/2016  Ost Cx lesion, 65 %stenosed.  Mid RCA lesion, 100 %stenosed.  Dist RCA lesion, 100 %stenosed.  1st Diag lesion, 70 %stenosed.  1st Sept lesion, 95 %stenosed.  Mid LAD to Dist LAD lesion, 75 %stenosed.  The left ventricular ejection fraction is 50-55% by visual estimate.  The left ventricular systolic function is normal.  LV end diastolic pressure is mildly elevated.    Severe coronary artery disease with total occlusion of the mid RCA filling left-to-right by collaterals. Severe diffuse mid LAD 75% stenosis within a calcified tortuous segment. Moderate eccentric ostial circumflex 65% stenosis.  Overall normal left ventricular function, with upper normal LVEDP, and estimated ejection  fraction of 55%.   RECOMMENDATIONS:   Further up-titrate anti-anginal therapy by adding Imdur 60 mg per day.  Aggressive risk factor modification discussed with the patient including better diabetes control and smoking cessation.  If all maximal medical therapy there are still limiting symptoms, I'll recommend consideration of bypass surgery given total occlusion of the RCA, and segmental tortuous mid LAD which would probably be better treated surgical therapy rather than stenting (Allergy to aspirin/tortuosity/calcification/long segment of disease).  Echocardiogram: 03/2016 Study Conclusions  - Left ventricle: The cavity size was normal. Wall thickness was   increased in a pattern of moderate LVH. Systolic function was   normal. The estimated ejection fraction was in the range of 60%   to 65%. Wall motion was normal; there were no regional wall   motion abnormalities. Left ventricular diastolic function   parameters were normal. - Aortic valve: Valve area (VTI): 3.35 cm^2. Valve area (Vmax): 3.5   cm^2. Valve area (Vmean): 3.23 cm^2. - Mitral valve: Mildly calcified annulus. - Atrial septum: No defect or patent foramen ovale was identified. - Technically adequate study.   Assessment:    1. Coronary artery disease involving native coronary artery of native heart with other form of angina pectoris ([place])   2. Precordial pain   3. Essential hypertension   4. Hyperlipidemia LDL goal <70   5. IDDM (insulin dependent diabetes mellitus) ([place])   6. Tobacco use      Plan:   In order of problems  listed above:  1. CAD/ Precordial Chest Pain - s/p CABG in 03/2016 with LIMA-LAD, SVG-D1, SVG-RCA, and Seq SVG-mid and distal OM. He reports intermittent episodes of sharp chest discomfort which can occur at rest or with activity and has been intermittent for the past few weeks. Recent ED evaluation was negative. While his symptoms overall seem atypical for an ischemic etiology he  reports this is different from his prior chest wall pain and does resemble his symptoms when he had prior MI's. Reviewed options with the patient in regards to adjusting his medications vs. ischemic testing and he wishes to proceed with testing. Will plan to obtain a Lexiscan Myoview. If no significant ischemia, would recommend titration of Ranexa to 1000 mg twice daily. - continue Plavix (allergic to ASA), BB, statin therapy, and Ranexa.   2. HTN - BP is at 140/80 during today's visit. He reports having recently purchased a blood pressure cuff and I encouraged him to follow his readings at home. Will continue his current medication regimen at this time including Amlodipine 10 mg daily, Lisinopril 10 mg daily, and Lopressor 12.5 mg twice daily. Lisinopril could be further titrated if additional BP control is needed.  3. HLD - FLP in 07/2018 showed total cholesterol 172, triglycerides 93, HDL 78, and LDL 75. He reports having repeat labs obtained by his PCP last month. Will request these records.  He remains on Atorvastatin 40 mg daily.  4. IDDM - followed by PCP. Patient unsure of last Hgb A1c results. Will request records.   5. Tobacco Use - he continues to smoke approximately 0.5 ppd. Cessation advised.   Medication Adjustments/Labs and Tests Ordered: Current medicines are reviewed at length with the patient today.  Concerns regarding medicines are outlined above.  Medication changes, Labs and Tests ordered today are listed in the Patient Instructions below. Patient Instructions  Medication Instructions:  Your physician recommends that you continue on your current medications as directed. Please refer to the Current Medication list given to you today.  I have refilled your Nitro   If you need a refill on your cardiac medications before your next appointment, please call your pharmacy.   Lab work: NONE   If you have labs (blood work) drawn today and your tests are completely normal, you  will receive your results only by:  [facility] (if you have MyChart) OR  A paper copy in the mail If you have any lab test that is abnormal or we need to change your treatment, we will call you to review the results.  Testing/Procedures: Your physician has requested that you have a lexiscan myoview. For further information please visit [email or web address]. Please follow instruction sheet, as given.    Follow-Up: At [facility], you and your health needs are our priority.  As part of our continuing mission to provide you with exceptional heart care, we have created designated Provider Care Teams.  These Care Teams include your primary Cardiologist (physician) and Advanced Practice Providers (APPs -  Physician Assistants and Nurse Practitioners) who all work together to provide you with the care you need, when you need it. You will need a follow up appointment in 3 months.  Please call our office 2 months in advance to schedule this appointment.  You may see Kate Sable, MD or one of the following Advanced Practice Providers on your designated Care Team:   Bernerd Pho, PA-C ([place])  Ermalinda Barrios, PA-C [facility])  Any Other Special Instructions Will Be Listed Below (  If Applicable). Thank you for choosing [facility]!    Signed, Erma Heritage, PA-C  02/20/2019 5:05 PM    [facility] [facility] [address] [place], [place] [address] Phone: [phone] Fax: [phone]

## 2019-02-22 LAB — HEMOGLOBIN A1C: Hemoglobin A1C: >14

## 2019-02-26 ENCOUNTER — Ambulatory Visit: Payer: Medicaid Other | Admitting: Diagnostic Neuroimaging

## 2019-02-26 ENCOUNTER — Telehealth: Payer: Self-pay | Admitting: *Deleted

## 2019-02-26 NOTE — Telephone Encounter (Signed)
Patient was no show for follow up today. 

## 2019-02-27 ENCOUNTER — Encounter (HOSPITAL_COMMUNITY)
Admission: RE | Admit: 2019-02-27 | Discharge: 2019-02-27 | Disposition: A | Discharge status: Home / Self Care | Payer: Medicaid Other | Source: Ambulatory Visit | Attending: Student | Admitting: Student

## 2019-02-27 ENCOUNTER — Other Ambulatory Visit: Payer: Self-pay

## 2019-02-27 ENCOUNTER — Encounter (HOSPITAL_BASED_OUTPATIENT_CLINIC_OR_DEPARTMENT_OTHER)
Admission: RE | Admit: 2019-02-27 | Discharge: 2019-02-27 | Disposition: A | Discharge status: Home / Self Care | Payer: Medicaid Other | Source: Ambulatory Visit | Attending: Student | Admitting: Student

## 2019-02-27 ENCOUNTER — Encounter: Payer: Self-pay | Admitting: Diagnostic Neuroimaging

## 2019-02-27 DIAGNOSIS — R072 Precordial pain: Secondary | ICD-10-CM | POA: Insufficient documentation

## 2019-02-27 LAB — NM MYOCAR MULTI W/SPECT W/WALL MOTION / EF
Peak HR: 95 {beats}/min
Rest HR: 64 {beats}/min

## 2019-02-27 MED ORDER — SODIUM CHLORIDE 0.9% FLUSH
INTRAVENOUS | Status: AC
  Administered 2019-02-27: 10 mL via INTRAVENOUS
  Filled 2019-02-27: qty 10

## 2019-02-27 MED ORDER — TECHNETIUM TC 99M TETROFOSMIN IV KIT
10.0000 | PACK | Freq: Once | INTRAVENOUS | Status: AC | PRN
  Administered 2019-02-27: 10.4 via INTRAVENOUS

## 2019-02-27 MED ORDER — TECHNETIUM TC 99M TETROFOSMIN IV KIT
30.0000 | PACK | Freq: Once | INTRAVENOUS | Status: AC | PRN
  Administered 2019-02-27: 33 via INTRAVENOUS

## 2019-02-27 MED ORDER — REGADENOSON 0.4 MG/5ML IV SOLN
INTRAVENOUS | Status: AC
  Administered 2019-02-27: 0.4 mg via INTRAVENOUS
  Filled 2019-02-27: qty 5

## 2019-03-01 ENCOUNTER — Telehealth: Payer: Self-pay | Admitting: *Deleted

## 2019-03-01 DIAGNOSIS — R072 Precordial pain: Secondary | ICD-10-CM

## 2019-03-01 NOTE — Telephone Encounter (Signed)
-----   Message from Erma Heritage, [place] sent at 02/28/2019  4:24 PM EDT ----- Please let the patient know his stress test showed evidence of prior infarct and known CAD which is expected given prior CABG. No current blockages were noted which is reassuring given his chest pain. When they read the study, it did show the EF as being reduced which can sometimes show up on the nuclear studies and not be accurate. Would recommended a limited echo to assess pumping function and wall motion to make sure there is no weakness of the heart muscle. Please forward a copy of results to Rosita Fire, MD.

## 2019-03-04 ENCOUNTER — Ambulatory Visit (HOSPITAL_COMMUNITY)
Admission: RE | Admit: 2019-03-04 | Discharge: 2019-03-04 | Disposition: A | Discharge status: Home / Self Care | Payer: Medicaid Other | Source: Ambulatory Visit | Attending: Student | Admitting: Student

## 2019-03-04 ENCOUNTER — Other Ambulatory Visit: Payer: Self-pay

## 2019-03-04 DIAGNOSIS — R072 Precordial pain: Secondary | ICD-10-CM | POA: Diagnosis present

## 2019-03-04 NOTE — Progress Notes (Signed)
*  PRELIMINARY RESULTS* Echocardiogram 2D Echocardiogram has been performed.  Samuel Germany 03/04/2019, 12:44 PM

## 2019-03-22 ENCOUNTER — Ambulatory Visit: Payer: Medicaid Other | Admitting: "Endocrinology

## 2019-03-28 ENCOUNTER — Telehealth: Payer: Self-pay | Admitting: Diagnostic Neuroimaging

## 2019-03-28 NOTE — Telephone Encounter (Signed)
FYI-Kristy from [facility] care of [facility] called stating that the pt is not taking his Keppra correctly. Pt stated that he take it once in a while.

## 2019-03-29 ENCOUNTER — Ambulatory Visit (INDEPENDENT_AMBULATORY_CARE_PROVIDER_SITE_OTHER): Payer: Medicaid Other | Admitting: Podiatry

## 2019-03-29 ENCOUNTER — Encounter: Payer: Self-pay | Admitting: Podiatry

## 2019-03-29 ENCOUNTER — Other Ambulatory Visit: Payer: Self-pay

## 2019-03-29 DIAGNOSIS — E1142 Type 2 diabetes mellitus with diabetic polyneuropathy: Secondary | ICD-10-CM | POA: Diagnosis not present

## 2019-03-29 DIAGNOSIS — M722 Plantar fascial fibromatosis: Secondary | ICD-10-CM | POA: Diagnosis not present

## 2019-03-29 NOTE — Progress Notes (Signed)
Subjective: 53 year old male presents the office today for follow-up evaluation of left foot pain.  States he is majority pain is in the arch of the foot and this is been bothering him he describes sharp pain to the bottom of the heel.  He denies any recent injury.  The pain gets worse in the morning when he first gets up and gets somewhat better with activity but is becoming consistent.  He denies any pain in the big toe.  No recent injury no swelling or redness. Denies any systemic complaints such as fevers, chills, nausea, vomiting. No acute changes since last appointment, and no other complaints at this time.   Objective: AAO x3, NAD DP/PT pulses palpable bilaterally, CRT less than 3 seconds There is tenderness palpation on plantar medial tubercle of the calcaneus at insertion of plantar fascial tear bypass appears to be intact there is discomfort in the arch of the foot along the plantar fascia.  There is no area pinpoint tenderness there is no edema, erythema identified today.  Negative Tinel sign. No open lesions or pre-ulcerative lesions.  No pain with calf compression, swelling, warmth, erythema  Assessment: Plantar fasciitis  Plan: -All treatment options discussed with the patient including all alternatives, risks, complications.  -Again he brought up the bone spur to the big toe but this is not causing the pain in the arch of the foot.  They will discuss treatment options for plantar fasciitis.  Given medical history of oral anti-inflammatories.  Wants to hold off on steroid injection.  I dispensed Biofreeze.  Dispensed night splint as well as metatarsal brace.  Stretching, icing daily.  Discussed shoe modifications and orthotics. -He was hesitant on treatment options. He did not speak much at today's appointment.  -Patient encouraged to call the office with any questions, concerns, change in symptoms.   Trula Slade DPM

## 2019-03-29 NOTE — Patient Instructions (Signed)

## 2019-04-01 NOTE — Telephone Encounter (Signed)
LVM for [place] requesting she call back. I advised her the patient whom I identified by initials and dob was no show for follow up. I informed her he has FU in Oct, but it could be moved up sooner.  Left number for call back.

## 2019-04-02 NOTE — Telephone Encounter (Addendum)
Marita [place], nurse care manager called back and stated the patient has not been taking Keppra for several months. His last fill was in May x 1 month. He told Marita Kansas he quit taking it "because it made him too sleepy". He did not notify our office. He denied any seizure activity. Marita [place] asked for his Oct FU to be moved sooner to discuss seizure medications. We rescheduled; She will call him. I advised he arrive 30 minutes early, wear a mask, bring insurance cards, med list. Marita [place] verbalized understanding, appreciation.

## 2019-04-02 NOTE — Telephone Encounter (Signed)
LVM for [place] requesting call back.

## 2019-04-05 NOTE — Telephone Encounter (Signed)
Pt returned call. Please call back when available. 

## 2019-04-09 NOTE — Telephone Encounter (Signed)
I called patient and advised him the call he probably received was either from [place] his nurse care manager or a reminder call of his appointment tomorrow. I advised he has a one year follow up, arrive at 12:30 for 1 pm appointment tomorrow. I advised it is his one year follow up for his seizures and medication.  I asked if he needed our address, and he stated he's been here before and doesn't need it. He verbalized understanding, appreciation.

## 2019-04-10 ENCOUNTER — Ambulatory Visit: Payer: Self-pay | Admitting: Diagnostic Neuroimaging

## 2019-04-10 NOTE — Telephone Encounter (Signed)
LVM for Melvin Taylor, nurse care manager advising her I spoke to patient yesterday and reminded him of follow up today. I informed her he was a no show.

## 2019-04-11 ENCOUNTER — Encounter: Payer: Self-pay | Admitting: "Endocrinology

## 2019-04-11 ENCOUNTER — Other Ambulatory Visit: Payer: Self-pay

## 2019-04-11 ENCOUNTER — Ambulatory Visit (INDEPENDENT_AMBULATORY_CARE_PROVIDER_SITE_OTHER): Payer: Medicaid Other | Admitting: "Endocrinology

## 2019-04-11 ENCOUNTER — Encounter: Payer: Self-pay | Admitting: Diagnostic Neuroimaging

## 2019-04-11 VITALS — BP 164/92 | HR 62 | Ht 68.0 in | Wt 149.0 lb

## 2019-04-11 DIAGNOSIS — E782 Mixed hyperlipidemia: Secondary | ICD-10-CM | POA: Diagnosis not present

## 2019-04-11 DIAGNOSIS — E119 Type 2 diabetes mellitus without complications: Secondary | ICD-10-CM | POA: Insufficient documentation

## 2019-04-11 DIAGNOSIS — I10 Essential (primary) hypertension: Secondary | ICD-10-CM | POA: Diagnosis not present

## 2019-04-11 DIAGNOSIS — E1159 Type 2 diabetes mellitus with other circulatory complications: Secondary | ICD-10-CM | POA: Diagnosis not present

## 2019-04-11 DIAGNOSIS — E1122 Type 2 diabetes mellitus with diabetic chronic kidney disease: Secondary | ICD-10-CM

## 2019-04-11 DIAGNOSIS — Z794 Long term (current) use of insulin: Secondary | ICD-10-CM

## 2019-04-11 DIAGNOSIS — N183 Chronic kidney disease, stage 3 unspecified: Secondary | ICD-10-CM

## 2019-04-11 MED ORDER — ACCU-CHEK GUIDE VI STRP: ORAL_STRIP | 2 refills | Status: DC

## 2019-04-11 MED ORDER — ACCU-CHEK GUIDE W/DEVICE KIT: 1.0000 | PACK | 0 refills | Status: DC

## 2019-04-11 MED ORDER — LANTUS SOLOSTAR 100 UNIT/ML ~~LOC~~ SOPN: 26.0000 [IU] | PEN_INJECTOR | Freq: Every day | SUBCUTANEOUS | 3 refills | Status: DC

## 2019-04-11 NOTE — Progress Notes (Signed)
04/11/2019 . Endocrinology follow-up note    Subjective:    Patient ID: Melvin Taylor, male    DOB: [birth year], PCP Rosita Fire, MD   Past Medical History:  Diagnosis Date  . Anxiety   . Bipolar 1 disorder ([place])   . Cervical radiculopathy   . Chest pain   . Chronic back pain   . Chronic chest wall pain   . Chronic neck pain   . Coronary artery disease    a. s/p CABG in 03/2016 with LIMA-LAD, SVG-D1, SVG-RCA, and Seq SVG-mid and distal OM  . Depression   . Diabetes mellitus    Type II  . Hallucinations    "long history of them"  . Headache(784.0)   . History of gout   . HTN (hypertension)   . Insomnia   . Myocardial infarction ([place]) 2017  . Neuropathy   . Pain management   . Polysubstance abuse ([place])   . Right leg pain    chronic  . Schizophrenia ([place])   . Seizures ([place])    last sz between July 5-9th, 2016; epilepsy  . Shortness of breath dyspnea    with exertion  . Sleep apnea   . Transfusion of blood product refused for religious reason    Past Surgical History:  Procedure Laterality Date  . ANTERIOR CERVICAL DECOMP/DISCECTOMY FUSION N/A 12/14/2015   Procedure: ANTERIOR CERVICAL DECOMPRESSION/DISCECTOMY FUSION CERVICAL FIVE -SIX;  Surgeon: Earnie Larsson, MD;  Location: [facility];  Service: Neurosurgery;  Laterality: N/A;  . BIOPSY  05/07/2015   Procedure: BIOPSY (Gastric);  Surgeon: [name], MD;  Location: [facility];  Service: Endoscopy;;  . CARDIAC CATHETERIZATION N/A 03/07/2016   Procedure: Left Heart Cath and Coronary Angiography;  Surgeon: Belva Crome, MD;  Location: [place] [facility];  Service: Cardiovascular;  Laterality: N/A;  . COLONOSCOPY WITH PROPOFOL N/A 05/07/2015   UPJ:SRPRXYVOPF diverticulosis, multiple colon polyps removed, tubular adenoma, serrated colon polyp. Next colonoscopy October 2019  . COLONOSCOPY WITH PROPOFOL N/A 08/02/2018   Procedure: COLONOSCOPY WITH PROPOFOL;  Surgeon: [name], MD;  Location: [facility];  Service:  Endoscopy;  Laterality: N/A;  12:00pm  . CORONARY ARTERY BYPASS GRAFT N/A 03/28/2016   Procedure: CORONARY ARTERY BYPASS GRAFTING (CABG) x 5 USING GREATER SAPHENOUS VEIN;  Surgeon: [name], MD;  Location: [facility];  Service: Open Heart Surgery;  Laterality: N/A;  . ENDOVEIN HARVEST OF GREATER SAPHENOUS VEIN Right 03/28/2016   Procedure: ENDOVEIN HARVEST OF GREATER SAPHENOUS VEIN;  Surgeon: [name], MD;  Location: [facility];  Service: Open Heart Surgery;  Laterality: Right;  . ESOPHAGEAL DILATION N/A 05/07/2015   Procedure: ESOPHAGEAL DILATION WITH 56FR MALONEY DILATOR;  Surgeon: [name], MD;  Location: [facility];  Service: Endoscopy;  Laterality: N/A;  . ESOPHAGOGASTRODUODENOSCOPY (EGD) WITH PROPOFOL N/A 05/07/2015   RMR: Status post dilation of normal esophagus. Gastritis.  Marland Kitchen KNEE SURGERY Left    arthroscopy  . MANDIBLE FRACTURE SURGERY    . POLYPECTOMY  05/07/2015   Procedure: POLYPECTOMY (Hepatic Flexure, Distal Transverse Colon, Rectal);  Surgeon: [name], MD;  Location: [facility];  Service: Endoscopy;;  . TEE WITHOUT CARDIOVERSION N/A 03/28/2016   Procedure: TRANSESOPHAGEAL ECHOCARDIOGRAM (TEE);  Surgeon: [name], MD;  Location: [place];  Service: Open Heart Surgery;  Laterality: N/A;  . TONSILLECTOMY     Social History   Socioeconomic History  . Marital status: Single    Spouse name: Not on file  .  Number of children: Not on file  . Years of education: 11th grade  . Highest education level: Not on file  Occupational History  . Occupation: unemployed    Fish farm manager: NOT EMPLOYED  Social Needs  . Financial resource strain: Not on file  . Food insecurity    Worry: Not on file    Inability: Not on file  . Transportation needs    Medical: Not on file    Non-medical: Not on file  Tobacco Use  . Smoking status: Current Every Day Smoker    Packs/day: 1.00    Years: 30.00    Pack years: 30.00    Types: Cigarettes  . Smokeless tobacco: Never Used  . Tobacco comment:  3 cigs per day  Substance and Sexual Activity  . Alcohol use: No  . Drug use: No  . Sexual activity: Not Currently  Lifestyle  . Physical activity    Days per week: Not on file    Minutes per session: Not on file  . Stress: Not on file  Relationships  . Social Herbalist on phone: Not on file    Gets together: Not on file    Attends religious service: Not on file    Active member of club or organization: Not on file    Attends meetings of clubs or organizations: Not on file    Relationship status: Not on file  Other Topics Concern  . Not on file  Social History Narrative   Lives alone   Drinks a cup of coffee a day   Outpatient Encounter Medications as of 04/11/2019  Medication Sig  . insulin aspart (NOVOLOG FLEXPEN) 100 UNIT/ML FlexPen Inject 6-12 Units into the skin 3 (three) times daily with meals.  Marland Kitchen albuterol (PROVENTIL HFA;VENTOLIN HFA) 108 (90 Base) MCG/ACT inhaler Inhale 1-2 puffs into the lungs every 6 (six) hours as needed for wheezing or shortness of breath.  Marland Kitchen amLODipine (NORVASC) 10 MG tablet Take 1 tablet (10 mg total) by mouth daily. Pt needs appt to continue to receive refills  . atorvastatin (LIPITOR) 40 MG tablet Take 1 tablet (40 mg total) by mouth daily.  . Blood Glucose Monitoring Suppl (ACCU-CHEK GUIDE) w/Device KIT 1 Piece by Does not apply route as directed.  . clopidogrel (PLAVIX) 75 MG tablet Take 75 mg by mouth daily.   . diazepam (VALIUM) 5 MG tablet Take 5 mg by mouth 2 (two) times daily as needed for anxiety. Can take 2 tablets at bedtime  . diclofenac sodium (VOLTAREN) 1 % GEL Apply 2 g topically 4 (four) times daily. Rub into affected area of foot 2 to 4 times daily  . divalproex (DEPAKOTE) 500 MG DR tablet Take 3 tablets (1,500 mg total) by mouth 2 (two) times daily.  Marland Kitchen escitalopram (LEXAPRO) 10 MG tablet Take 10 mg by mouth daily.  Marland Kitchen glucose blood (ACCU-CHEK GUIDE) test strip Use as instructed  . Insulin Glargine (LANTUS SOLOSTAR) 100  UNIT/ML Solostar Pen Inject 26 Units into the skin at bedtime.  . levETIRAcetam (KEPPRA) 750 MG tablet Take 2 tablets (1,500 mg total) by mouth 2 (two) times daily.  Marland Kitchen lisinopril (PRINIVIL,ZESTRIL) 10 MG tablet Take 1 tablet (10 mg total) by mouth daily.  . metoprolol tartrate (LOPRESSOR) 25 MG tablet Take 0.5 tablets (12.5 mg total) by mouth 2 (two) times daily.  . nitroGLYCERIN (NITROSTAT) 0.4 MG SL tablet Place 1 tablet (0.4 mg total) under the tongue every 5 (five) minutes as needed for chest pain.  Marland Kitchen  RANEXA 500 MG 12 hr tablet TAKE (1) TABLET BY MOUTH TWICE DAILY.  Marland Kitchen risperiDONE (RISPERDAL) 2 MG tablet Take 2 mg by mouth at bedtime.  . [DISCONTINUED] ACCU-CHEK AVIVA PLUS test strip USE AS DIRECTED TO TEST 4 TIMES DAILY.  . [DISCONTINUED] Insulin Glargine (LANTUS SOLOSTAR) 100 UNIT/ML Solostar Pen Inject 30 Units into the skin at bedtime.  . [DISCONTINUED] paliperidone (INVEGA) 6 MG 24 hr tablet Take 12 mg by mouth daily.   No facility-administered encounter medications on file as of 04/11/2019.    ALLERGIES: Allergies  Allergen Reactions  . Aspirin Swelling and Other (See Comments)    Facial swelling    VACCINATION STATUS: Immunization History  Administered Date(s) Administered  . Influenza,trivalent, recombinat, inj, PF 05/02/2015  . Tdap 01/18/2015    Diabetes He presents for his follow-up diabetic visit. He has type 2 diabetes mellitus. Onset time: He was diagnosed the approximate age of 63 years. His disease course has been worsening. There are no hypoglycemic associated symptoms. Pertinent negatives for hypoglycemia include no confusion, headaches, pallor or seizures. Pertinent negatives for diabetes include no chest pain, no fatigue, no polyphagia and no weakness. There are no hypoglycemic complications. Symptoms are worsening. Diabetic complications include heart disease. (He is seriously noncompliant patient. He is now diagnosed with coronary artery disease at [facility]  since last visit. He is awaiting for his outpatient cardiology evaluation. This assay to be set for 03/24/2016.) Risk factors for coronary artery disease include diabetes mellitus, dyslipidemia, hypertension, male sex, tobacco exposure and sedentary lifestyle. Current diabetic treatment includes insulin injections. He is compliant with treatment none of the time. His weight is fluctuating minimally. He is following a generally unhealthy diet. When asked about meal planning, he reported none. He has had a previous visit with a dietitian. He never participates in exercise. His home blood glucose trend is increasing steadily. (His diabetes is complicated by coronary artery disease, CKD, chronic noncompliance/nonadherence.  After missing his appointment since May 2018, he was re- referred with A1c of >14%. He did not bring any logs nor meter to review. ) An ACE inhibitor/angiotensin II receptor blocker is not being taken.  Hyperlipidemia This is a chronic problem. The current episode started more than 1 year ago. Exacerbating diseases include diabetes. Pertinent negatives include no chest pain, myalgias or shortness of breath. Current antihyperlipidemic treatment includes statins. Risk factors for coronary artery disease include diabetes mellitus, dyslipidemia, hypertension, a sedentary lifestyle and male sex.  Hypertension Pertinent negatives include no chest pain, headaches, neck pain, palpitations or shortness of breath. Risk factors for coronary artery disease include smoking/tobacco exposure, diabetes mellitus, dyslipidemia and male gender.     Review of Systems  Constitutional: Negative for fatigue and unexpected weight change.  HENT: Negative for dental problem, mouth sores and trouble swallowing.   Eyes: Negative for visual disturbance.  Respiratory: Negative for cough, choking, chest tightness, shortness of breath and wheezing.   Cardiovascular: Negative for chest pain, palpitations and leg  swelling.  Gastrointestinal: Negative for abdominal distention, abdominal pain, constipation, diarrhea, nausea and vomiting.  Endocrine: Negative for polyphagia.  Genitourinary: Negative for dysuria, flank pain, hematuria and urgency.  Musculoskeletal: Negative for back pain, gait problem, myalgias and neck pain.  Skin: Negative for pallor, rash and wound.  Neurological: Negative for seizures, syncope, weakness, numbness and headaches.  Psychiatric/Behavioral: Negative.  Negative for confusion and dysphoric mood.    Objective:    BP (!) 164/92   Pulse 62   Ht _0  (1.727  m)   Wt 149 lb (67.6 kg)   BMI 22.66 kg/m   Wt Readings from Last 3 Encounters:  04/11/19 149 lb (67.6 kg)  02/20/19 149 lb (67.6 kg)  02/12/19 150 lb (68 kg)    Physical Exam  Constitutional: He is oriented to person, place, and time. He appears well-developed and well-nourished. He is cooperative. No distress.  HENT:  Head: Normocephalic and atraumatic.  Eyes: EOM are normal.  Neck: Normal range of motion. Neck supple. No tracheal deviation present. No thyromegaly present.  Cardiovascular: Normal rate, S1 normal and S2 normal. Exam reveals no gallop.  No murmur heard. Pulses:      Dorsalis pedis pulses are 1+ on the right side and 1+ on the left side.       Posterior tibial pulses are 1+ on the right side and 1+ on the left side.  Pulmonary/Chest: Effort normal. No respiratory distress. He has no wheezes.  Abdominal: Soft. He exhibits no distension. There is no abdominal tenderness. There is no guarding and no CVA tenderness.  Musculoskeletal:        General: No edema.     Right shoulder: He exhibits no swelling and no deformity.  Neurological: He is alert and oriented to person, place, and time. He has normal strength and normal reflexes. No cranial nerve deficit or sensory deficit. Gait normal.  Skin: Skin is warm and dry. No rash noted. No cyanosis. Nails show no clubbing.  Psychiatric: His speech is  normal. Cognition and memory are normal.  Noncompliant behavior, unconcerned affect.   Chemistry (most recent): Lab Results  Component Value Date   NA 134 (L) 02/12/2019   K 4.1 02/12/2019   CL 96 (L) 02/12/2019   CO2 29 02/12/2019   BUN 32 (H) 02/12/2019   CREATININE 1.78 (H) 02/12/2019   Diabetic Labs (most recent): Lab Results  Component Value Date   HGBA1C >14.0 02/22/2019   HGBA1C 6.1 (H) 11/17/2016   HGBA1C 10.4 (H) 03/24/2016   Lipid Panel     Component Value Date/Time   CHOL 172 07/20/2018 1002   TRIG 93 07/20/2018 1002   HDL 78 07/20/2018 1002   CHOLHDL 2.0 03/22/2016 0853   VLDL 7 03/22/2016 0853   LDLCALC 75 07/20/2018 1002    Assessment & Plan:   1. Uncontrolled type 2 diabetes mellitus with complication, with long-term current use of insulin (HCC)  - diabetes is  complicated by Coronary artery disease , CKD , chronic heavy smoking, noncompliance and patient remains at a high risk for more acute and chronic complications of diabetes which include CAD, CVA, CKD, retinopathy, and neuropathy. These are all discussed in detail with the patient.  -He missed his clinic appointment since 2018, returns with extremely high A1c of .14%. -I had a long discussion with him about treatment course of type 2 diabetes in the pathology behind its complications. - I have re-counseled the patient on diet management   by adopting a carbohydrate restricted / protein rich  Diet.  - he  admits there is a room for improvement in his diet and drink choices. -  Suggestion is made for him to avoid simple carbohydrates  from his diet including Cakes, Sweet Desserts / Pastries, Ice Cream, Soda (diet and regular), Sweet Tea, Candies, Chips, Cookies, Sweet Pastries,  Store Bought Juices, Alcohol in Excess of  1-2 drinks a day, Artificial Sweeteners, Coffee Creamer, and "Sugar-free" Products. This will help patient to have stable blood glucose profile and potentially avoid  unintended weight  gain.   - Patient is advised to stick to a routine mealtimes to eat 3 meals  a day and avoid unnecessary snacks ( to snack only to correct hypoglycemia).  -He has lost contact with his diabetes educators.  - I have approached patient with the following individualized plan to manage diabetes and patient reluctantly accepts .  - He is reapproached for intensive treatment with basal/bolus insulin in order for him to achieve and maintain control of diabetes to target.    -In preparation, I lowered his Lantus to 26 units nightly, adjusted her his NovoLog to 6 units 3 times daily AC for pre-meal blood glucose above 90 mg,  Plus specific correction for readings above 150 mg per DL.   -He is urged to start and continue monitoring blood glucose 4 times a day-before meals and at bedtime. -He will be on phone visit in 10 days to adjust his treatment.  - He is not a good candidate for incretin therapy nor metformin/SG LT 2 inhibitors . - Patient specific target  for A1c; LDL, HDL, Triglycerides, and  Waist Circumference were discussed in detail.  2) BP/HTN: His blood pressure is not controlled to target.  He is advised to continue lisinopril 10 mg p.o. daily, amlodipine 10 mg p.o. daily.  3)  Hyperlipidemia: He is advised to continue atorvastatin 40 mg p.o. nightly.  4) Medical noncompliance: He has significant medical history of noncompliance, I advised him to stay focused on self-care.  5) Chronic Care/Health Maintenance:  -Patient  Is on Statin medications and encouraged to continue to follow up with Ophthalmology, Podiatrist at least yearly or according to recommendations, and advised to quit smoking. I have recommended yearly flu vaccine and pneumonia vaccination at least every 5 years; moderate intensity exercise for up to 150 minutes weekly; and  sleep for at least 7 hours a day.   - Patient Care Time Today:  45 min, of which >50% was spent in  counseling and the rest reviewing his  current  and  previous labs/studies, previous treatments, his blood glucose readings, and medications' doses and developing a plan for long-term care based on the latest recommendations for standards of care.   Lendon Ka Jambor participated in the discussions, expressed understanding, and voiced agreement with the above plans.  All questions were answered to his satisfaction. he is encouraged to contact clinic should he have any questions or concerns prior to his return visit.   Follow up plan: -Return in about 10 days (around 04/21/2019) for Follow up with Meter and Logs Only - no Labs.  Glade Lloyd, MD Phone: [phone]  Fax: [phone]   04/11/2019, 5:33 PM

## 2019-04-11 NOTE — Patient Instructions (Signed)
                                     Advice for Weight Management  -For most of us the best way to lose weight is by diet management. Generally speaking, diet management means consuming less calories intentionally which over time brings about progressive weight loss.  This can be achieved more effectively by restricting carbohydrate consumption to the minimum possible.  So, it is critically important to know your numbers: how much calorie you are consuming and how much calorie you need. More importantly, our carbohydrates sources should be unprocessed or minimally processed complex starch food items.   Sometimes, it is important to balance nutrition by increasing protein intake (animal or plant source), fruits, and vegetables.  -Sticking to a routine mealtime to eat 3 meals a day and avoiding unnecessary snacks is shown to have a big role in weight control. Under normal circumstances, the only time we lose real weight is when we are hungry, so allow hunger to take place- hunger means no food between meal times, only water.  It is not advisable to starve.   -It is better to avoid simple carbohydrates including: Cakes, Sweet Desserts, Ice Cream, Soda (diet and regular), Sweet Tea, Candies, Chips, Cookies, Store Bought Juices, Alcohol in Excess of  1-2 drinks a day, Artificial Sweeteners, Doughnuts, Coffee Creamers, "Sugar-free" Products, etc, etc.  This is not a complete list.....    -Consulting with certified diabetes educators is proven to provide you with the most accurate and current information on diet.  Also, you may be  interested in discussing diet options/exchanges , we can schedule a visit with Penny Crumpton, RDN, CDE for individualized nutrition education.  -Exercise: If you are able: 30 -60 minutes a day ,4 days a week, or 150 minutes a week.  The longer the better.  Combine stretch, strength, and aerobic activities.  If you were told in the past that you  have high risk for cardiovascular diseases, you may seek evaluation by your heart doctor prior to initiating moderate to intense exercise programs.                                  Additional Care Considerations for Diabetes   -Diabetes  is a chronic disease.  The most important care consideration is regular follow-up with your diabetes care provider with the goal being avoiding or delaying its complications and to take advantage of advances in medications and technology.    -Type 2 diabetes is known to coexist with other important comorbidities such as high blood pressure and high cholesterol.  It is critical to control not only the diabetes but also the high blood pressure and high cholesterol to minimize and delay the risk of complications including coronary artery disease, stroke, amputations, blindness, etc.    - Studies showed that people with diabetes will benefit from a class of medications known as ACE inhibitors and statins.  Unless there are specific reasons not to be on these medications, the standard of care is to consider getting one from these groups of medications at an optimal doses.  These medications are generally considered safe and proven to help protect the heart and the kidneys.    - People with diabetes are encouraged to initiate and maintain regular follow-up with eye doctors, foot doctors, dentists ,   and if necessary heart and kidney doctors.     - It is highly recommended that people with diabetes quit smoking or stay away from smoking, and get yearly  flu vaccine and pneumonia vaccine at least every 5 years.  One other important lifestyle recommendation is to ensure adequate sleep - at least 7 hours of uninterrupted sleep at night.  -Exercise: If you are able: 30 -60 minutes a day, 4 days a week, or 150 minutes a week.  The longer the better.  Combine stretch, strength, and aerobic activities.  If you were told in the past that you have high risk for cardiovascular  diseases, you may seek evaluation by your heart doctor prior to initiating moderate to intense exercise programs.          

## 2019-04-17 ENCOUNTER — Ambulatory Visit: Payer: Medicaid Other | Admitting: Diagnostic Neuroimaging

## 2019-04-23 ENCOUNTER — Ambulatory Visit (INDEPENDENT_AMBULATORY_CARE_PROVIDER_SITE_OTHER): Payer: Medicaid Other | Admitting: "Endocrinology

## 2019-04-23 ENCOUNTER — Encounter: Payer: Self-pay | Admitting: "Endocrinology

## 2019-04-23 ENCOUNTER — Other Ambulatory Visit: Payer: Self-pay

## 2019-04-23 DIAGNOSIS — I10 Essential (primary) hypertension: Secondary | ICD-10-CM | POA: Diagnosis not present

## 2019-04-23 DIAGNOSIS — E782 Mixed hyperlipidemia: Secondary | ICD-10-CM | POA: Diagnosis not present

## 2019-04-23 DIAGNOSIS — Z794 Long term (current) use of insulin: Secondary | ICD-10-CM

## 2019-04-23 DIAGNOSIS — E1122 Type 2 diabetes mellitus with diabetic chronic kidney disease: Secondary | ICD-10-CM | POA: Diagnosis not present

## 2019-04-23 DIAGNOSIS — N183 Chronic kidney disease, stage 3 unspecified: Secondary | ICD-10-CM

## 2019-04-23 DIAGNOSIS — E1159 Type 2 diabetes mellitus with other circulatory complications: Secondary | ICD-10-CM

## 2019-04-23 MED ORDER — LANTUS SOLOSTAR 100 UNIT/ML ~~LOC~~ SOPN: 30.0000 [IU] | PEN_INJECTOR | Freq: Every day | SUBCUTANEOUS | 3 refills | Status: DC

## 2019-04-23 NOTE — Progress Notes (Signed)
04/23/2019 .                                                    Endocrinology Telehealth Visit Follow up Note -During COVID -19 Pandemic  This visit type was conducted due to national recommendations for restrictions regarding the COVID-19 Pandemic  in an effort to limit this patient's exposure and mitigate transmission of the corona virus.  Due to his co-morbid illnesses, [name] is at  moderate to high risk for complications without adequate follow up.  This format is felt to be most appropriate for him at this time.  I connected with this patient on 04/23/2019   by telephone and verified that I am speaking with the correct person using two identifiers. Melvin Taylor, 06/23/1966. he has verbally consented to this visit. All issues noted in this document were discussed and addressed. The format was not optimal for physical exam.    Subjective:    Patient ID: Melvin Taylor, male    DOB: November 19, 1965, PCP Rosita Fire, MD   Past Medical History:  Diagnosis Date  . Anxiety   . Bipolar 1 disorder ([place])   . Cervical radiculopathy   . Chest pain   . Chronic back pain   . Chronic chest wall pain   . Chronic neck pain   . Coronary artery disease    a. s/p CABG in 03/2016 with LIMA-LAD, SVG-D1, SVG-RCA, and Seq SVG-mid and distal OM  . Depression   . Diabetes mellitus    Type II  . Hallucinations    "long history of them"  . Headache(784.0)   . History of gout   . HTN (hypertension)   . Insomnia   . Myocardial infarction ([place]) 2017  . Neuropathy   . Pain management   . Polysubstance abuse ([place])   . Right leg pain    chronic  . Schizophrenia ([place])   . Seizures ([place])    last sz between July 5-9th, 2016; epilepsy  . Shortness of breath dyspnea    with exertion  . Sleep apnea   . Transfusion of blood product refused for religious reason    Past Surgical History:  Procedure Laterality Date  . ANTERIOR CERVICAL DECOMP/DISCECTOMY FUSION N/A 12/14/2015   Procedure:  ANTERIOR CERVICAL DECOMPRESSION/DISCECTOMY FUSION CERVICAL FIVE -SIX;  Surgeon: Earnie Larsson, MD;  Location: [facility];  Service: Neurosurgery;  Laterality: N/A;  . BIOPSY  05/07/2015   Procedure: BIOPSY (Gastric);  Surgeon: [name], MD;  Location: [facility];  Service: Endoscopy;;  . CARDIAC CATHETERIZATION N/A 03/07/2016   Procedure: Left Heart Cath and Coronary Angiography;  Surgeon: Belva Crome, MD;  Location: [facility];  Service: Cardiovascular;  Laterality: N/A;  . COLONOSCOPY WITH PROPOFOL N/A 05/07/2015   NOM:[id number] diverticulosis, multiple colon polyps removed, tubular adenoma, serrated colon polyp. Next colonoscopy October 2019  . COLONOSCOPY WITH PROPOFOL N/A 08/02/2018   Procedure: COLONOSCOPY WITH PROPOFOL;  Surgeon: [name], MD;  Location: [facility];  Service: Endoscopy;  Laterality: N/A;  12:00pm  . CORONARY ARTERY BYPASS GRAFT N/A 03/28/2016   Procedure: CORONARY ARTERY BYPASS GRAFTING (CABG) x 5 USING GREATER SAPHENOUS VEIN;  Surgeon: [name], MD;  Location: [facility] OR;  Service: Open Heart Surgery;  Laterality: N/A;  . ENDOVEIN HARVEST OF GREATER SAPHENOUS VEIN  Right 03/28/2016   Procedure: ENDOVEIN HARVEST OF GREATER SAPHENOUS VEIN;  Surgeon: [name], MD;  Location: [facility];  Service: Open Heart Surgery;  Laterality: Right;  . ESOPHAGEAL DILATION N/A 05/07/2015   Procedure: ESOPHAGEAL DILATION WITH 56FR MALONEY DILATOR;  Surgeon: [name], MD;  Location: [facility];  Service: Endoscopy;  Laterality: N/A;  . ESOPHAGOGASTRODUODENOSCOPY (EGD) WITH PROPOFOL N/A 05/07/2015   RMR: Status post dilation of normal esophagus. Gastritis.  Marland Kitchen KNEE SURGERY Left    arthroscopy  . MANDIBLE FRACTURE SURGERY    . POLYPECTOMY  05/07/2015   Procedure: POLYPECTOMY (Hepatic Flexure, Distal Transverse Colon, Rectal);  Surgeon: [name], MD;  Location: [facility];  Service: Endoscopy;;  . TEE WITHOUT CARDIOVERSION N/A 03/28/2016   Procedure: TRANSESOPHAGEAL  ECHOCARDIOGRAM (TEE);  Surgeon: [name], MD;  Location: [facility];  Service: Open Heart Surgery;  Laterality: N/A;  . TONSILLECTOMY     Social History   Socioeconomic History  . Marital status: Single    Spouse name: Not on file  . Number of children: Not on file  . Years of education: 11th grade  . Highest education level: Not on file  Occupational History  . Occupation: unemployed    Fish farm manager: NOT EMPLOYED  Social Needs  . Financial resource strain: Not on file  . Food insecurity    Worry: Not on file    Inability: Not on file  . Transportation needs    Medical: Not on file    Non-medical: Not on file  Tobacco Use  . Smoking status: Current Every Day Smoker    Packs/day: 1.00    Years: 30.00    Pack years: 30.00    Types: Cigarettes  . Smokeless tobacco: Never Used  . Tobacco comment: 3 cigs per day  Substance and Sexual Activity  . Alcohol use: No  . Drug use: No  . Sexual activity: Not Currently  Lifestyle  . Physical activity    Days per week: Not on file    Minutes per session: Not on file  . Stress: Not on file  Relationships  . Social Herbalist on phone: Not on file    Gets together: Not on file    Attends religious service: Not on file    Active member of club or organization: Not on file    Attends meetings of clubs or organizations: Not on file    Relationship status: Not on file  Other Topics Concern  . Not on file  Social History Narrative   Lives alone   Drinks a cup of coffee a day   Outpatient Encounter Medications as of 04/23/2019  Medication Sig  . albuterol (PROVENTIL HFA;VENTOLIN HFA) 108 (90 Base) MCG/ACT inhaler Inhale 1-2 puffs into the lungs every 6 (six) hours as needed for wheezing or shortness of breath.  Marland Kitchen amLODipine (NORVASC) 10 MG tablet Take 1 tablet (10 mg total) by mouth daily. Pt needs appt to continue to receive refills  . atorvastatin (LIPITOR) 40 MG tablet Take 1 tablet (40 mg total) by mouth daily.  . Blood  Glucose Monitoring Suppl (ACCU-CHEK GUIDE) w/Device KIT 1 Piece by Does not apply route as directed.  . clopidogrel (PLAVIX) 75 MG tablet Take 75 mg by mouth daily.   . diazepam (VALIUM) 5 MG tablet Take 5 mg by mouth 2 (two) times daily as needed for anxiety. Can take 2 tablets at bedtime  . diclofenac sodium (VOLTAREN) 1 % GEL Apply 2  g topically 4 (four) times daily. Rub into affected area of foot 2 to 4 times daily  . divalproex (DEPAKOTE) 500 MG DR tablet Take 3 tablets (1,500 mg total) by mouth 2 (two) times daily.  Marland Kitchen escitalopram (LEXAPRO) 10 MG tablet Take 10 mg by mouth daily.  Marland Kitchen glucose blood (ACCU-CHEK GUIDE) test strip Use as instructed  . insulin aspart (NOVOLOG FLEXPEN) 100 UNIT/ML FlexPen Inject 6-12 Units into the skin 3 (three) times daily with meals.  . Insulin Glargine (LANTUS SOLOSTAR) 100 UNIT/ML Solostar Pen Inject 30 Units into the skin at bedtime.  . levETIRAcetam (KEPPRA) 750 MG tablet Take 2 tablets (1,500 mg total) by mouth 2 (two) times daily.  Marland Kitchen lisinopril (PRINIVIL,ZESTRIL) 10 MG tablet Take 1 tablet (10 mg total) by mouth daily.  . metoprolol tartrate (LOPRESSOR) 25 MG tablet Take 0.5 tablets (12.5 mg total) by mouth 2 (two) times daily.  . nitroGLYCERIN (NITROSTAT) 0.4 MG SL tablet Place 1 tablet (0.4 mg total) under the tongue every 5 (five) minutes as needed for chest pain.  Marland Kitchen RANEXA 500 MG 12 hr tablet TAKE (1) TABLET BY MOUTH TWICE DAILY.  Marland Kitchen risperiDONE (RISPERDAL) 2 MG tablet Take 2 mg by mouth at bedtime.  . [DISCONTINUED] Insulin Glargine (LANTUS SOLOSTAR) 100 UNIT/ML Solostar Pen Inject 26 Units into the skin at bedtime.  . [DISCONTINUED] paliperidone (INVEGA) 6 MG 24 hr tablet Take 12 mg by mouth daily.   No facility-administered encounter medications on file as of 04/23/2019.    ALLERGIES: Allergies  Allergen Reactions  . Aspirin Swelling and Other (See Comments)    Facial swelling    VACCINATION STATUS: Immunization History  Administered Date(s)  Administered  . Influenza,trivalent, recombinat, inj, PF 05/02/2015  . Tdap 01/18/2015    Diabetes He presents for his follow-up diabetic visit. He has type 2 diabetes mellitus. Onset time: He was diagnosed the approximate age of 59 years. His disease course has been worsening. There are no hypoglycemic associated symptoms. Pertinent negatives for hypoglycemia include no confusion, headaches, pallor or seizures. Pertinent negatives for diabetes include no chest pain, no fatigue, no polyphagia and no weakness. There are no hypoglycemic complications. Symptoms are improving. Diabetic complications include heart disease. (He is seriously noncompliant patient. He is now diagnosed with coronary artery disease at [facility] since last visit. He is awaiting for his outpatient cardiology evaluation. This assay to be set for 03/24/2016.) Risk factors for coronary artery disease include diabetes mellitus, dyslipidemia, hypertension, male sex, tobacco exposure and sedentary lifestyle. Current diabetic treatment includes insulin injections. He is compliant with treatment none of the time. He is following a generally unhealthy diet. When asked about meal planning, he reported none. He has had a previous visit with a dietitian. He never participates in exercise. His home blood glucose trend is increasing steadily. His breakfast blood glucose range is generally >200 mg/dl. His lunch blood glucose range is generally 180-200 mg/dl. His dinner blood glucose range is generally 180-200 mg/dl. His bedtime blood glucose range is generally >200 mg/dl. His overall blood glucose range is >200 mg/dl. (His diabetes is complicated by coronary artery disease, CKD, chronic noncompliance/nonadherence.  He reports improving glycemic profile, despite his recent A1c of >14%.  ) An ACE inhibitor/angiotensin II receptor blocker is not being taken.  Hyperlipidemia This is a chronic problem. The current episode started more than 1 year  ago. Exacerbating diseases include diabetes. Pertinent negatives include no chest pain, myalgias or shortness of breath. Current antihyperlipidemic treatment includes statins. Risk  factors for coronary artery disease include diabetes mellitus, dyslipidemia, hypertension, a sedentary lifestyle and male sex.  Hypertension Pertinent negatives include no chest pain, headaches, neck pain, palpitations or shortness of breath. Risk factors for coronary artery disease include smoking/tobacco exposure, diabetes mellitus, dyslipidemia and male gender.    Review of systems: Limited as above.  Objective:    There were no vitals taken for this visit.  Wt Readings from Last 3 Encounters:  04/11/19 149 lb (67.6 kg)  02/20/19 149 lb (67.6 kg)  02/12/19 150 lb (68 kg)    Chemistry (most recent): Lab Results  Component Value Date   NA 134 (L) 02/12/2019   K 4.1 02/12/2019   CL 96 (L) 02/12/2019   CO2 29 02/12/2019   BUN 32 (H) 02/12/2019   CREATININE 1.78 (H) 02/12/2019   Diabetic Labs (most recent): Lab Results  Component Value Date   HGBA1C >14.0 02/22/2019   HGBA1C 6.1 (H) 11/17/2016   HGBA1C 10.4 (H) 03/24/2016   Lipid Panel     Component Value Date/Time   CHOL 172 07/20/2018 1002   TRIG 93 07/20/2018 1002   HDL 78 07/20/2018 1002   CHOLHDL 2.0 03/22/2016 0853   VLDL 7 03/22/2016 0853   LDLCALC 75 07/20/2018 1002    Assessment & Plan:   1. Uncontrolled type 2 diabetes mellitus with complication, with long-term current use of insulin (HCC)  - diabetes is  complicated by Coronary artery disease , CKD , chronic heavy smoking, noncompliance and patient remains at a high risk for more acute and chronic complications of diabetes which include CAD, CVA, CKD, retinopathy, and neuropathy. These are all discussed in detail with the patient.  -He reports slightly improving glycemic profile.  His recent A1c was extremely high at  >14%. -I had a long discussion with him about treatment course  of type 2 diabetes in the pathology behind its complications. - I have re-counseled the patient on diet management   by adopting a carbohydrate restricted / protein rich  Diet.  - - he  admits there is a room for improvement in his diet and drink choices. -  Suggestion is made for him to avoid simple carbohydrates  from his diet including Cakes, Sweet Desserts / Pastries, Ice Cream, Soda (diet and regular), Sweet Tea, Candies, Chips, Cookies, Sweet Pastries,  Store Bought Juices, Alcohol in Excess of  1-2 drinks a day, Artificial Sweeteners, Coffee Creamer, and "Sugar-free" Products. This will help patient to have stable blood glucose profile and potentially avoid unintended weight gain.   - Patient is advised to stick to a routine mealtimes to eat 3 meals  a day and avoid unnecessary snacks ( to snack only to correct hypoglycemia).  -He has lost contact with his diabetes educators.  - I have approached patient with the following individualized plan to manage diabetes and patient reluctantly accepts .  - He is urged to stay committed for proper monitoring of blood glucose for safe use of insulin.    -He is advised to increase Lantus to 30 units nightly , continue NovoLog  6 units 3 times daily AC for pre-meal blood glucose above 90 mg,  Plus specific correction for readings above 150 mg per DL.   -He is urged to start and continue monitoring blood glucose 4 times a day-before meals and at bedtime.   - He is not a good candidate for incretin therapy nor metformin/SG LT 2 inhibitors . - Patient specific target  for A1c; LDL, HDL,  Triglycerides, and  Waist Circumference were discussed in detail.  2) BP/HTN: he is advised to home monitor blood pressure and report if > 140/90 on 2 separate readings.   He is advised to continue lisinopril 10 mg p.o. daily, amlodipine 10 mg p.o. daily.  3)  Hyperlipidemia: He is advised to continue atorvastatin 40 mg p.o. nightly.    4) Medical noncompliance: He  has significant medical history of noncompliance, I advised him to stay focused on self-care.  5) Chronic Care/Health Maintenance:  -Patient  Is on Statin medications and encouraged to continue to follow up with Ophthalmology, Podiatrist at least yearly or according to recommendations, and advised to quit smoking. I have recommended yearly flu vaccine and pneumonia vaccination at least every 5 years; moderate intensity exercise for up to 150 minutes weekly; and  sleep for at least 7 hours a day.   - Patient Care Time Today:  25 min, of which >50% was spent in  counseling and the rest reviewing his  current and  previous labs/studies, previous treatments, his blood glucose readings, and medications' doses and developing a plan for long-term care based on the latest recommendations for standards of care.   Melvin Ka Pieratt participated in the discussions, expressed understanding, and voiced agreement with the above plans.  All questions were answered to his satisfaction. he is encouraged to contact clinic should he have any questions or concerns prior to his return visit.    Follow up plan: -Return in about 9 weeks (around 06/25/2019) for Bring Meter and Logs- A1c in Office.  Glade Lloyd, MD Phone: [phone]  Fax: [phone]   04/23/2019, 6:34 PM

## 2019-04-26 ENCOUNTER — Ambulatory Visit: Payer: Medicaid Other | Admitting: Podiatry

## 2019-05-03 ENCOUNTER — Ambulatory Visit (INDEPENDENT_AMBULATORY_CARE_PROVIDER_SITE_OTHER): Payer: Medicaid Other | Admitting: Podiatry

## 2019-05-03 ENCOUNTER — Encounter: Payer: Self-pay | Admitting: Podiatry

## 2019-05-03 ENCOUNTER — Other Ambulatory Visit: Payer: Self-pay

## 2019-05-03 ENCOUNTER — Telehealth: Payer: Self-pay | Admitting: *Deleted

## 2019-05-03 DIAGNOSIS — M722 Plantar fascial fibromatosis: Secondary | ICD-10-CM

## 2019-05-03 DIAGNOSIS — E1142 Type 2 diabetes mellitus with diabetic polyneuropathy: Secondary | ICD-10-CM | POA: Diagnosis not present

## 2019-05-03 DIAGNOSIS — Z87821 Personal history of retained foreign body fully removed: Secondary | ICD-10-CM

## 2019-05-03 DIAGNOSIS — R0989 Other specified symptoms and signs involving the circulatory and respiratory systems: Secondary | ICD-10-CM

## 2019-05-03 MED ORDER — TRAMADOL HCL 50 MG PO TABS: 50.0000 mg | ORAL_TABLET | Freq: Two times a day (BID) | ORAL | 0 refills | Status: AC | PRN

## 2019-05-03 NOTE — Telephone Encounter (Signed)
-----   Message from Trula Slade, DPM sent at 05/03/2019  7:59 AM EDT ----- Can you please order an MRI of the left foot to rule out plantar fascial tear? Thanks.

## 2019-05-06 DIAGNOSIS — R569 Unspecified convulsions: Secondary | ICD-10-CM | POA: Insufficient documentation

## 2019-05-06 HISTORY — DX: Unspecified convulsions: R56.9

## 2019-05-06 NOTE — Progress Notes (Signed)
Subjective: 53 year old male presents the office today for follow-up evaluation of left foot pain.  He states he still having pain to the foot he points to the arch of the foot where he is majority discomfort.  He states that at times he will stop and the pain will be so bad he falls to the ground.  He also states there is a history several years ago in which the nail went into the central part of his foot and he is not sure if this is related to that injury.  He states that he was able to remove the entire nail at the time.  No swelling.  No redness or warmth. Denies any systemic complaints such as fevers, chills, nausea, vomiting. No acute changes since last appointment, and no other complaints at this time.   Objective: AAO x3, NAD DP/PT pulses palpable bilaterally, CRT less than 3 seconds There is continuation of tenderness palpation on plantar medial tubercle of the calcaneus at insertion of plantar fascial however the plantar fascia appears to be intact. There is also discomfort in the arch of the foot along the plantar fascia.  There is no area pinpoint tenderness there is no edema, erythema identified today.  Negative Tinel sign.  There is no pain in the digits. No open lesions or pre-ulcerative lesions.  No pain with calf compression, swelling, warmth, erythema  Assessment: Plantar fasciitis  Plan: -All treatment options discussed with the patient including all alternatives, risks, complications.  -Plantar fascial taping was applied. -Given pain prescribed tramadol  -MRI ordered.  He has been conservative treatment for over 6 months with insignificant improvement on recommended MRI to rule out plantar fascial tear as well as possible cyst formation given history of injury.  Follow-up after MRI or sooner if needed  Trula Slade DPM

## 2019-05-06 NOTE — Telephone Encounter (Signed)
done

## 2019-05-07 ENCOUNTER — Encounter: Payer: Self-pay | Admitting: Diagnostic Neuroimaging

## 2019-05-07 ENCOUNTER — Telehealth: Payer: Self-pay | Admitting: Diagnostic Neuroimaging

## 2019-05-07 ENCOUNTER — Other Ambulatory Visit: Payer: Self-pay

## 2019-05-07 ENCOUNTER — Ambulatory Visit: Payer: Medicaid Other | Admitting: Diagnostic Neuroimaging

## 2019-05-07 VITALS — BP 147/96 | HR 64 | Temp 98.7°F | Ht 67.0 in | Wt 151.4 lb

## 2019-05-07 DIAGNOSIS — R442 Other hallucinations: Secondary | ICD-10-CM

## 2019-05-07 DIAGNOSIS — F3170 Bipolar disorder, currently in remission, most recent episode unspecified: Secondary | ICD-10-CM | POA: Diagnosis not present

## 2019-05-07 DIAGNOSIS — G40219 Localization-related (focal) (partial) symptomatic epilepsy and epileptic syndromes with complex partial seizures, intractable, without status epilepticus: Secondary | ICD-10-CM

## 2019-05-07 MED ORDER — DIVALPROEX SODIUM 500 MG PO DR TAB: 1500.0000 mg | DELAYED_RELEASE_TABLET | Freq: Two times a day (BID) | ORAL | 4 refills | Status: DC

## 2019-05-07 MED ORDER — LEVETIRACETAM 750 MG PO TABS: 1500.0000 mg | ORAL_TABLET | Freq: Two times a day (BID) | ORAL | 4 refills | Status: DC

## 2019-05-07 MED ORDER — LEVETIRACETAM 1000 MG PO TABS: 1000.0000 mg | ORAL_TABLET | Freq: Two times a day (BID) | ORAL | 4 refills | Status: DC

## 2019-05-07 NOTE — Telephone Encounter (Signed)
Melvin Taylor from [facility] called stating that she is needing clarification on the prescriptions that were sent in for the pt. Please advise.
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
## 2019-05-07 NOTE — Patient Instructions (Signed)
SEIZURE DISORDER  - continue depakote 1500mg  twice a day  - restart levetiracetam 1000mg  twice a day  - no driving until seizure free x 6 months

## 2019-05-07 NOTE — Telephone Encounter (Signed)
Returned call to Melvin Taylor, [place] who stated they received e scribe Rx today for levetiracetam 750 mg tabs then later received e scribe Rx for levetiracetam 1000 mg. I advised her Dr Leta Baptist reduced his total daily dose and per note, patient is on 1000 mg , one tab twice daily. She stated understanding and will discontinue 750 mg Rx.

## 2019-05-14 NOTE — Telephone Encounter (Signed)
Unable to leave message, pt's mailbox is full, requesting pt come in for x-rays of left foot for plantar fasciitis over 3 months.

## 2019-05-14 NOTE — Telephone Encounter (Signed)
[facility] - Medicaid notification states needs imaging to support that imaging reports do not support the need for advanced imaging. Last left foot x-ray 09/2018 not related to current advanced imaging request.

## 2019-05-15 NOTE — Telephone Encounter (Signed)
I informed pt that his insurance requires up dated x-rays to pre-cert for MRI of left foot. Pt states he has an appt on 05/17/2019 and I put note in appt notes to x-ray left foot.

## 2019-05-17 ENCOUNTER — Ambulatory Visit (INDEPENDENT_AMBULATORY_CARE_PROVIDER_SITE_OTHER): Payer: Medicaid Other | Admitting: Podiatry

## 2019-05-17 ENCOUNTER — Ambulatory Visit (INDEPENDENT_AMBULATORY_CARE_PROVIDER_SITE_OTHER): Payer: Medicaid Other

## 2019-05-17 ENCOUNTER — Other Ambulatory Visit: Payer: Self-pay

## 2019-05-17 DIAGNOSIS — M79672 Pain in left foot: Secondary | ICD-10-CM

## 2019-05-17 DIAGNOSIS — M722 Plantar fascial fibromatosis: Secondary | ICD-10-CM

## 2019-05-17 MED ORDER — KETOROLAC TROMETHAMINE 10 MG PO TABS: 10.0000 mg | ORAL_TABLET | Freq: Four times a day (QID) | ORAL | 0 refills | Status: DC | PRN

## 2019-05-20 ENCOUNTER — Ambulatory Visit: Payer: Medicaid Other | Admitting: Diagnostic Neuroimaging

## 2019-05-21 ENCOUNTER — Telehealth: Payer: Self-pay | Admitting: *Deleted

## 2019-05-21 ENCOUNTER — Encounter: Payer: Self-pay | Admitting: Podiatry

## 2019-05-21 NOTE — Progress Notes (Signed)
Subjective:  Patient ID: Melvin Taylor, male    DOB: 05-03-1966,  MRN: [id number]  Chief Complaint  Patient presents with  . Foot Pain    pt is here for a f/u on the left foot pain, pt states that the injection he recieved last time has not helped, pt also is waiting for MRI results    53 y.o. male presents with the above complaint.  Is here in follow-up of continuous plantar fasciitis pain.  He states that the foot is still hurting him.  Action in the boot he received last time has not helped.  RI has already been ordered for 1026 but has not been completed yet.  He states that he has tried all conservative therapy including stretching injection offloading with the boot but none of that has helped.  Denies any other acute complaints.  Also states he would like something else for pain medication.  Well-known to Dr. Earleen Newport.   Review of Systems: Negative except as noted in the HPI. Denies N/V/F/Ch.  Past Medical History:  Diagnosis Date  . Anxiety   . Bipolar 1 disorder ([place])   . Bone spur    left heel  . Cervical radiculopathy   . Chest pain   . Chronic back pain   . Chronic chest wall pain   . Chronic neck pain   . Coronary artery disease    a. s/p CABG in 03/2016 with LIMA-LAD, SVG-D1, SVG-RCA, and Seq SVG-mid and distal OM  . Depression   . Diabetes mellitus    Type II  . Hallucinations    "long history of them"  . Headache(784.0)   . History of gout   . HTN (hypertension)   . Insomnia   . Myocardial infarction ([place]) 2017  . Neuropathy   . Pain management   . Polysubstance abuse ([place])   . Right leg pain    chronic  . Schizophrenia ([place])   . Seizures ([place])    last sz between July 5-9th, 2016; epilepsy  . Shortness of breath dyspnea    with exertion  . Sleep apnea   . Transfusion of blood product refused for religious reason     Current Outpatient Medications:  .  albuterol (PROVENTIL HFA;VENTOLIN HFA) 108 (90 Base) MCG/ACT inhaler, Inhale 1-2 puffs into the  lungs every 6 (six) hours as needed for wheezing or shortness of breath., Disp: , Rfl:  .  amLODipine (NORVASC) 10 MG tablet, Take 1 tablet (10 mg total) by mouth daily. Pt needs appt to continue to receive refills, Disp: 90 tablet, Rfl: 1 .  Blood Glucose Monitoring Suppl (ACCU-CHEK GUIDE) w/Device KIT, 1 Piece by Does not apply route as directed., Disp: 1 kit, Rfl: 0 .  clopidogrel (PLAVIX) 75 MG tablet, Take 75 mg by mouth daily. , Disp: , Rfl:  .  diazepam (VALIUM) 5 MG tablet, Take 5 mg by mouth 2 (two) times daily as needed for anxiety. Can take 2 tablets at bedtime, Disp: , Rfl:  .  diclofenac sodium (VOLTAREN) 1 % GEL, Apply 2 g topically 4 (four) times daily. Rub into affected area of foot 2 to 4 times daily, Disp: 100 g, Rfl: 2 .  divalproex (DEPAKOTE) 500 MG DR tablet, Take 3 tablets (1,500 mg total) by mouth 2 (two) times daily., Disp: 540 tablet, Rfl: 4 .  escitalopram (LEXAPRO) 10 MG tablet, Take 10 mg by mouth daily., Disp: , Rfl:  .  glucose blood (ACCU-CHEK GUIDE) test strip, Use as instructed,  Disp: 150 each, Rfl: 2 .  insulin aspart (NOVOLOG FLEXPEN) 100 UNIT/ML FlexPen, Inject 6-12 Units into the skin 3 (three) times daily with meals., Disp: , Rfl:  .  Insulin Glargine (LANTUS SOLOSTAR) 100 UNIT/ML Solostar Pen, Inject 30 Units into the skin at bedtime., Disp: 5 pen, Rfl: 3 .  levETIRAcetam (KEPPRA) 1000 MG tablet, Take 1 tablet (1,000 mg total) by mouth 2 (two) times daily., Disp: 180 tablet, Rfl: 4 .  nitroGLYCERIN (NITROSTAT) 0.4 MG SL tablet, Place 1 tablet (0.4 mg total) under the tongue every 5 (five) minutes as needed for chest pain., Disp: 25 tablet, Rfl: 3 .  RANEXA 500 MG 12 hr tablet, TAKE (1) TABLET BY MOUTH TWICE DAILY., Disp: 60 tablet, Rfl: 11 .  risperiDONE (RISPERDAL) 2 MG tablet, Take 2 mg by mouth at bedtime., Disp: , Rfl:  .  atorvastatin (LIPITOR) 40 MG tablet, Take 1 tablet (40 mg total) by mouth daily., Disp: 90 tablet, Rfl: 3 .  ketorolac (TORADOL) 10 MG  tablet, Take 1 tablet (10 mg total) by mouth every 6 (six) hours as needed., Disp: 20 tablet, Rfl: 0 .  lisinopril (PRINIVIL,ZESTRIL) 10 MG tablet, Take 1 tablet (10 mg total) by mouth daily., Disp: 90 tablet, Rfl: 3 .  metoprolol tartrate (LOPRESSOR) 25 MG tablet, Take 0.5 tablets (12.5 mg total) by mouth 2 (two) times daily., Disp: 90 tablet, Rfl: 3  Social History   Tobacco Use  Smoking Status Current Every Day Smoker  . Packs/day: 1.00  . Years: 30.00  . Pack years: 30.00  . Types: Cigarettes  Smokeless Tobacco Never Used  Tobacco Comment   3 cigs per day    Allergies  Allergen Reactions  . Aspirin Swelling and Other (See Comments)    Facial swelling    Objective:  There were no vitals filed for this visit. There is no height or weight on file to calculate BMI. Constitutional Well developed. Well nourished.  Vascular Dorsalis pedis pulses palpable bilaterally. Posterior tibial pulses palpable bilaterally. Capillary refill normal to all digits.  No cyanosis or clubbing noted. Pedal hair growth normal.  Neurologic Normal speech. Oriented to person, place, and time. Epicritic sensation to light touch grossly present bilaterally.  Dermatologic Nails well groomed and normal in appearance. No open wounds. No skin lesions.  Orthopedic: Normal joint ROM without pain or crepitus bilaterally. No visible deformities. Tender to palpation at the calcaneal tuber left. No pain with calcaneal squeeze left. Ankle ROM diminished range of motion left. Silfverskiold Test: positive left.   Radiographs: Taken and reviewed. No acute fractures or dislocations. No evidence of stress fracture.  Plantar heel spur present. Posterior heel spur present.  Spurs are very small in nature.  Assessment:   1. Plantar fasciitis   2. Left foot pain    Plan:  Patient was evaluated and treated and all questions answered.  Plantar Fasciitis, left - XR reviewed as above.  -Reeducated  on icing and  stretching. Instructions given.  -I sent Toradol over to the pharmacy to help with acute inflammation of the left heel. -MRI has been set for 1026 will follow up with the patient after the MRI is finished.   No follow-ups on file.

## 2019-05-21 NOTE — Telephone Encounter (Signed)
-----   Message from [name], DPM sent at 05/21/2019  7:48 AM EDT ----- Melvin Taylor,  Would you be able to order an MRI of the left foot.  I would like to evaluate the plantar fascia.  Patient has had continuous pain due to plantar fasciitis has not resolved with multiple conservative therapy   thank you

## 2019-05-23 ENCOUNTER — Other Ambulatory Visit: Payer: Self-pay | Admitting: Podiatry

## 2019-05-23 DIAGNOSIS — R0989 Other specified symptoms and signs involving the circulatory and respiratory systems: Secondary | ICD-10-CM

## 2019-05-23 DIAGNOSIS — M722 Plantar fascial fibromatosis: Secondary | ICD-10-CM

## 2019-05-23 NOTE — Telephone Encounter (Signed)
Sent [employer] to [place] explaining initial Medicaid Case: [id number] had been denied, and Dr. Posey Pronto ordered [phone] left foot and was approved with authorization information sent in the message. Anderson Malta returned message stating she changed orders to Dr. Posey Pronto.

## 2019-05-23 NOTE — Telephone Encounter (Signed)
Lawrence states she will transfer to Nurse Reviewer. Nurse Reviewer-Ray reviewed new clinicals of 05/17/2019 and authorized the MRI left foot [id number] ordered by Dr. Boneta Lucks. [place] FOR LEFT FOOT MRI [id number], AUTHORIZATIONIW:[id number], VALID:  05/23/2019, EXPIRES: 11/19/2019.

## 2019-05-23 NOTE — Telephone Encounter (Signed)
Case: [id number] has expired. Junie Bame gave New Case:  [id number], and was to transfer to Nurse Reviewer, but call was disconnected.

## 2019-05-27 ENCOUNTER — Other Ambulatory Visit: Payer: Self-pay

## 2019-05-27 ENCOUNTER — Ambulatory Visit
Admission: RE | Admit: 2019-05-27 | Discharge: 2019-05-27 | Disposition: A | Discharge status: Home / Self Care | Payer: Medicaid Other | Source: Ambulatory Visit | Attending: Podiatry | Admitting: Podiatry

## 2019-06-05 ENCOUNTER — Telehealth: Payer: Self-pay | Admitting: Podiatry

## 2019-06-05 NOTE — Telephone Encounter (Signed)
Pt had MRI done last week and is calling to follow up and see if the doctor has received the results.

## 2019-06-05 NOTE — Telephone Encounter (Signed)
I informed pt the MRI was normal without a plantar fascial tear, and if he still had pain he should come in to discuss with Dr. Posey Pronto, and I transferred to scheduler.

## 2019-06-05 NOTE — Telephone Encounter (Signed)
His MRI was negative for anythinng including plantar fascia tear. If he wants to discuss it further, he will have to make appointment.  Thanks [employer]

## 2019-06-10 ENCOUNTER — Telehealth: Payer: Self-pay | Admitting: *Deleted

## 2019-06-10 NOTE — Telephone Encounter (Signed)
Received [facility] EMU report from Dr Matilde Bash, placed on Dr [facility] desk for review. Per Dr Leta Baptist, gave copy to A Lomax, NP for review prior to follow up 11/06/2019. Per notes, on discharge patietn to continue taking Depakote ER 1500 mg BID, stopped Keppra and lamotrigine as no evidence of epilepsy.

## 2019-06-11 ENCOUNTER — Ambulatory Visit (INDEPENDENT_AMBULATORY_CARE_PROVIDER_SITE_OTHER): Payer: Medicaid Other | Admitting: "Endocrinology

## 2019-06-11 ENCOUNTER — Other Ambulatory Visit: Payer: Self-pay

## 2019-06-11 ENCOUNTER — Encounter: Payer: Self-pay | Admitting: "Endocrinology

## 2019-06-11 VITALS — BP 145/81 | HR 74 | Ht 67.0 in | Wt 152.0 lb

## 2019-06-11 DIAGNOSIS — E782 Mixed hyperlipidemia: Secondary | ICD-10-CM | POA: Diagnosis not present

## 2019-06-11 DIAGNOSIS — Z91199 Patient's noncompliance with other medical treatment and regimen due to unspecified reason: Secondary | ICD-10-CM

## 2019-06-11 DIAGNOSIS — I10 Essential (primary) hypertension: Secondary | ICD-10-CM

## 2019-06-11 DIAGNOSIS — Z9119 Patient's noncompliance with other medical treatment and regimen: Secondary | ICD-10-CM

## 2019-06-11 DIAGNOSIS — E1159 Type 2 diabetes mellitus with other circulatory complications: Secondary | ICD-10-CM | POA: Diagnosis not present

## 2019-06-11 LAB — POCT GLYCOSYLATED HEMOGLOBIN (HGB A1C): Hemoglobin A1C: 9.7 % — AB (ref 4.0–5.6)

## 2019-06-11 MED ORDER — LANTUS SOLOSTAR 100 UNIT/ML ~~LOC~~ SOPN: 20.0000 [IU] | PEN_INJECTOR | Freq: Every day | SUBCUTANEOUS | 3 refills | Status: DC

## 2019-06-11 NOTE — Progress Notes (Signed)
06/11/2019  Endocrinology follow-up note   Subjective:    Patient ID: Melvin Taylor, male    DOB: [birth year], PCP Rosita Fire, MD   Past Medical History:  Diagnosis Date  . Anxiety   . Bipolar 1 disorder ([place])   . Bone spur    left heel  . Cervical radiculopathy   . Chest pain   . Chronic back pain   . Chronic chest wall pain   . Chronic neck pain   . Coronary artery disease    a. s/p CABG in 03/2016 with LIMA-LAD, SVG-D1, SVG-RCA, and Seq SVG-mid and distal OM  . Depression   . Diabetes mellitus    Type II  . Hallucinations    "long history of them"  . Headache(784.0)   . History of gout   . HTN (hypertension)   . Insomnia   . Myocardial infarction ([place]) 2017  . Neuropathy   . Pain management   . Polysubstance abuse ([place])   . Right leg pain    chronic  . Schizophrenia ([place])   . Seizures ([place])    last sz between July 5-9th, 2016; epilepsy  . Shortness of breath dyspnea    with exertion  . Sleep apnea   . Transfusion of blood product refused for religious reason    Past Surgical History:  Procedure Laterality Date  . ANTERIOR CERVICAL DECOMP/DISCECTOMY FUSION N/A 12/14/2015   Procedure: ANTERIOR CERVICAL DECOMPRESSION/DISCECTOMY FUSION CERVICAL FIVE -SIX;  Surgeon: Earnie Larsson, MD;  Location: [facility];  Service: Neurosurgery;  Laterality: N/A;  . BIOPSY  05/07/2015   Procedure: BIOPSY (Gastric);  Surgeon: [name], MD;  Location: [facility];  Service: Endoscopy;;  . CARDIAC CATHETERIZATION N/A 03/07/2016   Procedure: Left Heart Cath and Coronary Angiography;  Surgeon: Belva Crome, MD;  Location: [place] [facility];  Service: Cardiovascular;  Laterality: N/A;  . COLONOSCOPY WITH PROPOFOL N/A 05/07/2015   GQQ:[id number] diverticulosis, multiple colon polyps removed, tubular adenoma, serrated colon polyp. Next colonoscopy October 2019  . COLONOSCOPY WITH PROPOFOL N/A 08/02/2018   Procedure: COLONOSCOPY WITH PROPOFOL;  Surgeon: [name], MD;  Location:  [facility];  Service: Endoscopy;  Laterality: N/A;  12:00pm  . CORONARY ARTERY BYPASS GRAFT N/A 03/28/2016   Procedure: CORONARY ARTERY BYPASS GRAFTING (CABG) x 5 USING GREATER SAPHENOUS VEIN;  Surgeon: [name], MD;  Location: [place] OR;  Service: Open Heart Surgery;  Laterality: N/A;  . ENDOVEIN HARVEST OF GREATER SAPHENOUS VEIN Right 03/28/2016   Procedure: ENDOVEIN HARVEST OF GREATER SAPHENOUS VEIN;  Surgeon: [name], MD;  Location: [place];  Service: Open Heart Surgery;  Laterality: Right;  . ESOPHAGEAL DILATION N/A 05/07/2015   Procedure: ESOPHAGEAL DILATION WITH 56FR MALONEY DILATOR;  Surgeon: [name], MD;  Location: [facility];  Service: Endoscopy;  Laterality: N/A;  . ESOPHAGOGASTRODUODENOSCOPY (EGD) WITH PROPOFOL N/A 05/07/2015   RMR: Status post dilation of normal esophagus. Gastritis.  Marland Kitchen KNEE SURGERY Left    arthroscopy  . MANDIBLE FRACTURE SURGERY    . POLYPECTOMY  05/07/2015   Procedure: POLYPECTOMY (Hepatic Flexure, Distal Transverse Colon, Rectal);  Surgeon: [name], MD;  Location: [facility];  Service: Endoscopy;;  . TEE WITHOUT CARDIOVERSION N/A 03/28/2016   Procedure: TRANSESOPHAGEAL ECHOCARDIOGRAM (TEE);  Surgeon: [name], MD;  Location: [place];  Service: Open Heart Surgery;  Laterality: N/A;  . TONSILLECTOMY     Social History   Socioeconomic History  . Marital status: Single  Spouse name: Not on file  . Number of children: Not on file  . Years of education: 11th grade  . Highest education level: Not on file  Occupational History  . Occupation: unemployed    Fish farm manager: NOT EMPLOYED  Social Needs  . Financial resource strain: Not on file  . Food insecurity    Worry: Not on file    Inability: Not on file  . Transportation needs    Medical: Not on file    Non-medical: Not on file  Tobacco Use  . Smoking status: Current Every Day Smoker    Packs/day: 1.00    Years: 30.00    Pack years: 30.00    Types: Cigarettes  . Smokeless tobacco: Never  Used  . Tobacco comment: 3 cigs per day  Substance and Sexual Activity  . Alcohol use: No  . Drug use: No  . Sexual activity: Not Currently  Lifestyle  . Physical activity    Days per week: Not on file    Minutes per session: Not on file  . Stress: Not on file  Relationships  . Social Herbalist on phone: Not on file    Gets together: Not on file    Attends religious service: Not on file    Active member of club or organization: Not on file    Attends meetings of clubs or organizations: Not on file    Relationship status: Not on file  Other Topics Concern  . Not on file  Social History Narrative   Lives alone   Drinks a cup of coffee a day   Outpatient Encounter Medications as of 06/11/2019  Medication Sig  . albuterol (PROVENTIL HFA;VENTOLIN HFA) 108 (90 Base) MCG/ACT inhaler Inhale 1-2 puffs into the lungs every 6 (six) hours as needed for wheezing or shortness of breath.  Marland Kitchen amLODipine (NORVASC) 10 MG tablet Take 1 tablet (10 mg total) by mouth daily. Pt needs appt to continue to receive refills  . atorvastatin (LIPITOR) 40 MG tablet Take 1 tablet (40 mg total) by mouth daily.  . Blood Glucose Monitoring Suppl (ACCU-CHEK GUIDE) w/Device KIT 1 Piece by Does not apply route as directed.  . clopidogrel (PLAVIX) 75 MG tablet Take 75 mg by mouth daily.   . diazepam (VALIUM) 5 MG tablet Take 5 mg by mouth 2 (two) times daily as needed for anxiety. Can take 2 tablets at bedtime  . diclofenac sodium (VOLTAREN) 1 % GEL Apply 2 g topically 4 (four) times daily. Rub into affected area of foot 2 to 4 times daily  . divalproex (DEPAKOTE) 500 MG DR tablet Take 3 tablets (1,500 mg total) by mouth 2 (two) times daily.  Marland Kitchen escitalopram (LEXAPRO) 10 MG tablet Take 10 mg by mouth daily.  Marland Kitchen glucose blood (ACCU-CHEK GUIDE) test strip Use as instructed  . insulin aspart (NOVOLOG FLEXPEN) 100 UNIT/ML FlexPen Inject 5-8 Units into the skin 3 (three) times daily with meals.  . Insulin  Glargine (LANTUS SOLOSTAR) 100 UNIT/ML Solostar Pen Inject 30 Units into the skin at bedtime. (Patient taking differently: Inject 10 Units into the skin at bedtime. )  . ketorolac (TORADOL) 10 MG tablet Take 1 tablet (10 mg total) by mouth every 6 (six) hours as needed.  Marland Kitchen lisinopril (PRINIVIL,ZESTRIL) 10 MG tablet Take 1 tablet (10 mg total) by mouth daily.  . metoprolol tartrate (LOPRESSOR) 25 MG tablet Take 0.5 tablets (12.5 mg total) by mouth 2 (two) times daily.  . nitroGLYCERIN (NITROSTAT) 0.4  MG SL tablet Place 1 tablet (0.4 mg total) under the tongue every 5 (five) minutes as needed for chest pain.  Marland Kitchen RANEXA 500 MG 12 hr tablet TAKE (1) TABLET BY MOUTH TWICE DAILY.  Marland Kitchen risperiDONE (RISPERDAL) 2 MG tablet Take 2 mg by mouth at bedtime.  . [DISCONTINUED] levETIRAcetam (KEPPRA) 1000 MG tablet Take 1 tablet (1,000 mg total) by mouth 2 (two) times daily. (Patient not taking: Reported on 06/10/2019)  . [DISCONTINUED] paliperidone (INVEGA) 6 MG 24 hr tablet Take 12 mg by mouth daily.   No facility-administered encounter medications on file as of 06/11/2019.    ALLERGIES: Allergies  Allergen Reactions  . Aspirin Swelling and Other (See Comments)    Facial swelling    VACCINATION STATUS: Immunization History  Administered Date(s) Administered  . Influenza,trivalent, recombinat, inj, PF 05/02/2015  . Tdap 01/18/2015    Diabetes He presents for his follow-up diabetic visit. He has type 2 diabetes mellitus. Onset time: He was diagnosed the approximate age of 69 years. His disease course has been worsening. There are no hypoglycemic associated symptoms. Pertinent negatives for hypoglycemia include no confusion, headaches, pallor or seizures. Pertinent negatives for diabetes include no chest pain, no fatigue, no polyphagia and no weakness. There are no hypoglycemic complications. Symptoms are improving. Diabetic complications include heart disease. (He is seriously noncompliant patient. He is now  diagnosed with coronary artery disease at [facility] since last visit. He is awaiting for his outpatient cardiology evaluation. This assay to be set for 03/24/2016.) Risk factors for coronary artery disease include diabetes mellitus, dyslipidemia, hypertension, male sex, tobacco exposure and sedentary lifestyle. Current diabetic treatment includes insulin injections. He is compliant with treatment none of the time. He is following a generally unhealthy diet. When asked about meal planning, he reported none. He has had a previous visit with a dietitian. He never participates in exercise. His home blood glucose trend is increasing steadily. His breakfast blood glucose range is generally >200 mg/dl. His lunch blood glucose range is generally 180-200 mg/dl. His dinner blood glucose range is generally 180-200 mg/dl. His bedtime blood glucose range is generally >200 mg/dl. His overall blood glucose range is >200 mg/dl. (His diabetes is complicated by coronary artery disease, CKD, chronic noncompliance/nonadherence.  He reports improving glycemic profile, despite his recent A1c of >14%.  ) An ACE inhibitor/angiotensin II receptor blocker is not being taken.  Hyperlipidemia This is a chronic problem. The current episode started more than 1 year ago. Exacerbating diseases include diabetes. Pertinent negatives include no chest pain, myalgias or shortness of breath. Current antihyperlipidemic treatment includes statins. Risk factors for coronary artery disease include diabetes mellitus, dyslipidemia, hypertension, a sedentary lifestyle and male sex.  Hypertension Pertinent negatives include no chest pain, headaches, neck pain, palpitations or shortness of breath. Risk factors for coronary artery disease include smoking/tobacco exposure, diabetes mellitus, dyslipidemia and male gender.    Constitutional:  + steady weight,  no fatigue, no subjective hyperthermia, no subjective hypothermia Eyes: no blurry vision,  no xerophthalmia ENT: no sore throat, no nodules palpated in throat, no dysphagia/odynophagia, no hoarseness Cardiovascular: no Chest Pain, no Shortness of Breath, no palpitations, no leg swelling Respiratory: no cough, no SOB Gastrointestinal: no Nausea/Vomiting/Diarhhea Musculoskeletal: no muscle/joint aches Skin: no rashes Neurological: no tremors, no numbness, no tingling, no dizziness Psychiatric: no depression, no anxiety   Objective:    BP (!) 145/81   Pulse 74   Ht 5' 7"  (1.702 m)   Wt 152 lb (68.9 kg)  BMI 23.81 kg/m   Wt Readings from Last 3 Encounters:  06/11/19 152 lb (68.9 kg)  05/07/19 151 lb 6.4 oz (68.7 kg)  04/11/19 149 lb (67.6 kg)      Physical Exam- Limited  Constitutional:  Body mass index is 23.81 kg/m. , not in acute distress, reluctant, unconcerned affect. Eyes:  EOMI, no exophthalmos Neck: Supple Respiratory: Adequate breathing efforts Musculoskeletal: no gross deformities, strength intact in all four extremities, no gross restriction of joint movements Skin:  no rashes, no hyperemia Neurological: no tremor with outstretched hands.  Chemistry (most recent): Lab Results  Component Value Date   NA 134 (L) 02/12/2019   K 4.1 02/12/2019   CL 96 (L) 02/12/2019   CO2 29 02/12/2019   BUN 32 (H) 02/12/2019   CREATININE 1.78 (H) 02/12/2019   Diabetic Labs (most recent): Lab Results  Component Value Date   HGBA1C 9.7 (A) 06/11/2019   HGBA1C >14.0 02/22/2019   HGBA1C 6.1 (H) 11/17/2016   Lipid Panel     Component Value Date/Time   CHOL 172 07/20/2018 1002   TRIG 93 07/20/2018 1002   HDL 78 07/20/2018 1002   CHOLHDL 2.0 03/22/2016 0853   VLDL 7 03/22/2016 0853   LDLCALC 75 07/20/2018 1002    Assessment & Plan:   1. Uncontrolled type 2 diabetes mellitus with complication, with long-term current use of insulin (HCC)  - diabetes is  complicated by Coronary artery disease , CKD , chronic heavy smoking, noncompliance and patient remains at  a high risk for more acute and chronic complications of diabetes which include CAD, CVA, CKD, retinopathy, and neuropathy. These are all discussed in detail with the patient.  -He was recently admitted at [facility] for altered mental status, he was discharged with different dose of his medications.   His point-of-care A1c today is 9.7% slightly improving from   >14%. Patient did not bring any logs nor meter to review.  He remains uncommitted for proper monitoring of blood glucose for safe use of insulin.   -I had a long discussion with him about treatment course of type 2 diabetes in the pathology behind its complications. - I have re-counseled the patient on diet management   by adopting a carbohydrate restricted / protein rich  Diet.  - he  admits there is a room for improvement in his diet and drink choices. -  Suggestion is made for him to avoid simple carbohydrates  from his diet including Cakes, Sweet Desserts / Pastries, Ice Cream, Soda (diet and regular), Sweet Tea, Candies, Chips, Cookies, Sweet Pastries,  Store Bought Juices, Alcohol in Excess of  1-2 drinks a day, Artificial Sweeteners, Coffee Creamer, and "Sugar-free" Products. This will help patient to have stable blood glucose profile and potentially avoid unintended weight gain.  - Patient is advised to stick to a routine mealtimes to eat 3 meals  a day and avoid unnecessary snacks ( to snack only to correct hypoglycemia).   - I have approached patient with the following individualized plan to manage diabetes and patient reluctantly accepts .  - He is urged to stay committed for proper monitoring of blood glucose for safe use of insulin.    -He is advised to increase his Lantus to 20 units nightly, increase his NovoLog to 5 units 3 times daily AC for pre-meal blood glucose above 90 mg,  Plus specific correction for readings above 150 mg per DL.   -He is urged to start and continue monitoring blood  glucose 4 times a  day-before meals and at bedtime.  - He is not a good candidate for incretin therapy nor metformin/SG LT 2 inhibitors . - Patient specific target  for A1c; LDL, HDL, Triglycerides, and  Waist Circumference were discussed in detail.  2) BP/HTN: His blood pressure is not controlled to target.   He is advised to continue lisinopril 10 mg p.o. daily, amlodipine 10 mg p.o. daily.  3)  Hyperlipidemia: He is advised to continue atorvastatin 40 mg p.o. nightly.    4) Medical noncompliance: He has significant medical history of noncompliance, I advised him to stay focused on self-care.  5) Chronic Care/Health Maintenance:  -Patient  Is on Statin medications and encouraged to continue to follow up with Ophthalmology, Podiatrist at least yearly or according to recommendations, and advised to quit smoking. I have recommended yearly flu vaccine and pneumonia vaccination at least every 5 years; moderate intensity exercise for up to 150 minutes weekly; and  sleep for at least 7 hours a day. He is advised to continue follow-up with his PMD for primary care needs.  He is extensively counseled for smoking cessation. - Patient Care Time Today:  25 min, of which >50% was spent in  counseling and the rest reviewing his  current and  previous labs/studies, previous treatments, his blood glucose readings, and medications' doses and developing a plan for long-term care based on the latest recommendations for standards of care.   Lendon Ka Trudell participated in the discussions, expressed understanding, and voiced agreement with the above plans.  All questions were answered to his satisfaction. he is encouraged to contact clinic should he have any questions or concerns prior to his return visit.   Follow up plan: -Return in about 10 days (around 06/21/2019), or phone, for Follow up with Meter and Logs Only - no Labs.  Glade Lloyd, MD Phone: [phone]  Fax: [phone]   06/11/2019, 3:28 PM

## 2019-06-19 ENCOUNTER — Other Ambulatory Visit: Payer: Self-pay

## 2019-06-19 ENCOUNTER — Encounter: Payer: Self-pay | Admitting: Podiatry

## 2019-06-19 ENCOUNTER — Ambulatory Visit (INDEPENDENT_AMBULATORY_CARE_PROVIDER_SITE_OTHER): Payer: Medicaid Other | Admitting: Podiatry

## 2019-06-19 DIAGNOSIS — M722 Plantar fascial fibromatosis: Secondary | ICD-10-CM | POA: Diagnosis not present

## 2019-06-19 DIAGNOSIS — Z7189 Other specified counseling: Secondary | ICD-10-CM

## 2019-06-19 DIAGNOSIS — E1142 Type 2 diabetes mellitus with diabetic polyneuropathy: Secondary | ICD-10-CM

## 2019-06-19 DIAGNOSIS — M79672 Pain in left foot: Secondary | ICD-10-CM

## 2019-06-19 MED ORDER — OXYCODONE-ACETAMINOPHEN 10-325 MG PO TABS: 1.0000 | ORAL_TABLET | Freq: Three times a day (TID) | ORAL | 0 refills | Status: AC | PRN

## 2019-06-19 NOTE — Patient Instructions (Signed)
Pre-Operative Instructions  Congratulations, you have decided to take an important step towards improving your quality of life.  You can be assured that the doctors and staff at [facility] will be with you every step of the way.  Here are some important things you should know:  1. Plan to be at the surgery center/hospital at least 1 (one) hour prior to your scheduled time, unless otherwise directed by the surgical center/hospital staff.  You must have a responsible adult accompany you, remain during the surgery and drive you home.  Make sure you have directions to the surgical center/hospital to ensure you arrive on time. 2. If you are having surgery at [facility] or [facility] hospitals, you will need a copy of your medical history and physical form from your family physician within one month prior to the date of surgery. We will give you a form for your primary physician to complete.  3. We make every effort to accommodate the date you request for surgery.  However, there are times where surgery dates or times have to be moved.  We will contact you as soon as possible if a change in schedule is required.   4. No aspirin/ibuprofen for one week before surgery.  If you are on aspirin, any non-steroidal anti-inflammatory medications (Mobic, Aleve, Ibuprofen) should not be taken seven (7) days prior to your surgery.  You make take Tylenol for pain prior to surgery.  5. Medications - If you are taking daily heart and blood pressure medications, seizure, reflux, allergy, asthma, anxiety, pain or diabetes medications, make sure you notify the surgery center/hospital before the day of surgery so they can tell you which medications you should take or avoid the day of surgery. 6. No food or drink after midnight the night before surgery unless directed otherwise by surgical center/hospital staff. 7. No alcoholic beverages 24-hours prior to surgery.  No smoking 24-hours prior or 24-hours after  surgery. 8. Wear loose pants or shorts. They should be loose enough to fit over bandages, boots, and casts. 9. Don't wear slip-on shoes. Sneakers are preferred. 10. Bring your boot with you to the surgery center/hospital.  Also bring crutches or a walker if your physician has prescribed it for you.  If you do not have this equipment, it will be provided for you after surgery. 11. If you have not been contacted by the surgery center/hospital by the day before your surgery, call to confirm the date and time of your surgery. 12. Leave-time from work may vary depending on the type of surgery you have.  Appropriate arrangements should be made prior to surgery with your employer. 13. Prescriptions will be provided immediately following surgery by your doctor.  Fill these as soon as possible after surgery and take the medication as directed. Pain medications will not be refilled on weekends and must be approved by the doctor. 14. Remove nail polish on the operative foot and avoid getting pedicures prior to surgery. 15. Wash the night before surgery.  The night before surgery wash the foot and leg well with water and the antibacterial soap provided. Be sure to pay special attention to beneath the toenails and in between the toes.  Wash for at least three (3) minutes. Rinse thoroughly with water and dry well with a towel.  Perform this wash unless told not to do so by your physician.  Enclosed: 1 Ice pack (please put in freezer the night before surgery)   1 Hibiclens skin cleaner     Pre-op instructions  If you have any questions regarding the instructions, please do not hesitate to call our office.  [place]: [address], [place], [place] [address] -- [phone]  [place]: [address] [place], [place] [address] -- [phone]  [place]: [address]  [place], [place] [address] -- [phone]   Website: [email or web address] 

## 2019-06-19 NOTE — Progress Notes (Signed)
3

## 2019-06-21 ENCOUNTER — Ambulatory Visit (INDEPENDENT_AMBULATORY_CARE_PROVIDER_SITE_OTHER): Payer: Medicaid Other | Admitting: "Endocrinology

## 2019-06-21 ENCOUNTER — Encounter: Payer: Self-pay | Admitting: "Endocrinology

## 2019-06-21 ENCOUNTER — Other Ambulatory Visit: Payer: Self-pay

## 2019-06-21 ENCOUNTER — Encounter: Payer: Self-pay | Admitting: Podiatry

## 2019-06-21 DIAGNOSIS — F17200 Nicotine dependence, unspecified, uncomplicated: Secondary | ICD-10-CM | POA: Diagnosis not present

## 2019-06-21 DIAGNOSIS — E1159 Type 2 diabetes mellitus with other circulatory complications: Secondary | ICD-10-CM | POA: Diagnosis not present

## 2019-06-21 DIAGNOSIS — E782 Mixed hyperlipidemia: Secondary | ICD-10-CM | POA: Diagnosis not present

## 2019-06-21 DIAGNOSIS — I10 Essential (primary) hypertension: Secondary | ICD-10-CM

## 2019-06-21 MED ORDER — LANTUS SOLOSTAR 100 UNIT/ML ~~LOC~~ SOPN: 24.0000 [IU] | PEN_INJECTOR | Freq: Every day | SUBCUTANEOUS | 3 refills | Status: DC

## 2019-06-21 NOTE — Progress Notes (Signed)
Subjective:  Patient ID: Melvin Taylor, male    DOB: 1965/09/08,  MRN: [id number]  Chief Complaint  Patient presents with  . Foot Problem    the left foot is hurting and the shot and the toradol is not helping and i did fall a few times with the left foot     53 y.o. male presents with the above complaint.  Presents for a follow-up of a left foot plantar fasciitis.  Patient straight that he has tried the injection and he has tried stretching exercises he states that nothing has helped.  Patient states that the bracing as well as stretching did not help at all.  Patient is also here to discuss the MRI results.  He states that the Toradol did not touch the pain at all either.  At this time he is considering surgical intervention to address the plantar fasciitis.  He denies any other acute complaints.  He ambulates in regular sneakers today.   Review of Systems: Negative except as noted in the HPI. Denies N/V/F/Ch.  Past Medical History:  Diagnosis Date  . Anxiety   . Bipolar 1 disorder ([place])   . Bone spur    left heel  . Cervical radiculopathy   . Chest pain   . Chronic back pain   . Chronic chest wall pain   . Chronic neck pain   . Coronary artery disease    a. s/p CABG in 03/2016 with LIMA-LAD, SVG-D1, SVG-RCA, and Seq SVG-mid and distal OM  . Depression   . Diabetes mellitus    Type II  . Hallucinations    "long history of them"  . Headache(784.0)   . History of gout   . HTN (hypertension)   . Insomnia   . Myocardial infarction ([place]) 2017  . Neuropathy   . Pain management   . Polysubstance abuse ([place])   . Right leg pain    chronic  . Schizophrenia ([place])   . Seizures ([place])    last sz between July 5-9th, 2016; epilepsy  . Shortness of breath dyspnea    with exertion  . Sleep apnea   . Transfusion of blood product refused for religious reason     Current Outpatient Medications:  .  albuterol (PROVENTIL HFA;VENTOLIN HFA) 108 (90 Base) MCG/ACT inhaler, Inhale 1-2  puffs into the lungs every 6 (six) hours as needed for wheezing or shortness of breath., Disp: , Rfl:  .  amLODipine (NORVASC) 10 MG tablet, Take 1 tablet (10 mg total) by mouth daily. Pt needs appt to continue to receive refills, Disp: 90 tablet, Rfl: 1 .  atorvastatin (LIPITOR) 40 MG tablet, Take 1 tablet (40 mg total) by mouth daily., Disp: 90 tablet, Rfl: 3 .  Blood Glucose Monitoring Suppl (ACCU-CHEK GUIDE) w/Device KIT, 1 Piece by Does not apply route as directed., Disp: 1 kit, Rfl: 0 .  clopidogrel (PLAVIX) 75 MG tablet, Take 75 mg by mouth daily. , Disp: , Rfl:  .  diazepam (VALIUM) 5 MG tablet, Take 5 mg by mouth 2 (two) times daily as needed for anxiety. Can take 2 tablets at bedtime, Disp: , Rfl:  .  diclofenac sodium (VOLTAREN) 1 % GEL, Apply 2 g topically 4 (four) times daily. Rub into affected area of foot 2 to 4 times daily, Disp: 100 g, Rfl: 2 .  divalproex (DEPAKOTE) 500 MG DR tablet, Take 3 tablets (1,500 mg total) by mouth 2 (two) times daily., Disp: 540 tablet, Rfl: 4 .  escitalopram (  LEXAPRO) 10 MG tablet, Take 10 mg by mouth daily., Disp: , Rfl:  .  glucose blood (ACCU-CHEK GUIDE) test strip, Use as instructed, Disp: 150 each, Rfl: 2 .  insulin aspart (NOVOLOG FLEXPEN) 100 UNIT/ML FlexPen, Inject 5-8 Units into the skin 3 (three) times daily with meals., Disp: , Rfl:  .  Insulin Glargine (LANTUS SOLOSTAR) 100 UNIT/ML Solostar Pen, Inject 20 Units into the skin at bedtime., Disp: 5 pen, Rfl: 3 .  ketorolac (TORADOL) 10 MG tablet, Take 1 tablet (10 mg total) by mouth every 6 (six) hours as needed., Disp: 20 tablet, Rfl: 0 .  lisinopril (PRINIVIL,ZESTRIL) 10 MG tablet, Take 1 tablet (10 mg total) by mouth daily., Disp: 90 tablet, Rfl: 3 .  metoprolol tartrate (LOPRESSOR) 25 MG tablet, Take 0.5 tablets (12.5 mg total) by mouth 2 (two) times daily., Disp: 90 tablet, Rfl: 3 .  nitroGLYCERIN (NITROSTAT) 0.4 MG SL tablet, Place 1 tablet (0.4 mg total) under the tongue every 5 (five)  minutes as needed for chest pain., Disp: 25 tablet, Rfl: 3 .  oxyCODONE-acetaminophen (PERCOCET) 10-325 MG tablet, Take 1 tablet by mouth every 8 (eight) hours as needed for up to 5 days for pain., Disp: 15 tablet, Rfl: 0 .  RANEXA 500 MG 12 hr tablet, TAKE (1) TABLET BY MOUTH TWICE DAILY., Disp: 60 tablet, Rfl: 11 .  risperiDONE (RISPERDAL) 2 MG tablet, Take 2 mg by mouth at bedtime., Disp: , Rfl:   Social History   Tobacco Use  Smoking Status Current Every Day Smoker  . Packs/day: 1.00  . Years: 30.00  . Pack years: 30.00  . Types: Cigarettes  Smokeless Tobacco Never Used  Tobacco Comment   3 cigs per day    Allergies  Allergen Reactions  . Aspirin Swelling and Other (See Comments)    Facial swelling    Objective:  There were no vitals filed for this visit. There is no height or weight on file to calculate BMI. Constitutional Well developed. Well nourished.  Vascular Dorsalis pedis pulses palpable bilaterally. Posterior tibial pulses palpable bilaterally. Capillary refill normal to all digits.  No cyanosis or clubbing noted. Pedal hair growth normal.  Neurologic Normal speech. Oriented to person, place, and time. Epicritic sensation to light touch grossly present bilaterally.  Dermatologic Nails well groomed and normal in appearance. No open wounds. No skin lesions.  Orthopedic: Normal joint ROM without pain or crepitus bilaterally. No visible deformities. The [place] test was reassessed.  It was negative for equinus Pain on palpation to the left plantar medial heel.  Pain with taunting of the plantar fascia with dorsiflexion of the digits.   Radiographs: IMPRESSION: 1. No acute findings or explanation for the patient's symptoms. 2. No evidence of plantar fasciitis or plantar fascial rupture. 3. The ankle tendons and ligaments appear intact. Mild thickening of the anterior talofibular ligament may be due to remote injury. 4. Prominent sustentacular talus with  mild subtalar and tibiotalar degenerative changes.   Assessment:  No diagnosis found. Plan:  Patient was evaluated and treated and all questions answered.  Left plantar fasciitis -Given that patient has failed all conservative therapy including injections immobilization physical therapy bracing, I believe patient will benefit from endoscopic plantar fasciotomy.  Patient is negative for equinus component therefore patient will not need a gastroc recession/Achilles tendon lengthening. -MRI was reviewed with the patient: However, I believe that patient will benefit from this procedure as he is still having pain right at the posterior medial aspect of the  heel very consistent with plantar fasciitis even in the setting of unremarkable MRI.  There is mild inflammation upon my review of the MRI at the origin of the plantar fascia.  I believe this is the primary reason why patient is having pain therefore partial medial band resection of the plantar fascia will help alleviate patient's pain.  -Informed surgical risk consent was reviewed and read aloud to the patient.  I reviewed the films.  I have discussed my findings with the patient in great detail.  I have discussed all risks including but not limited to infection, stiffness, scarring, limp, disability, deformity, damage to blood vessels and nerves, numbness, poor healing, need for braces, arthritis, chronic pain, amputation, death.  All benefits and realistic expectations discussed in great detail.  I have made no promises as to the outcome.  I have provided realistic expectations.  I have offered the patient a 2nd opinion, which they have declined and assured me they preferred to proceed despite the risks   No follow-ups on file.

## 2019-06-21 NOTE — Progress Notes (Signed)
06/21/2019  Endocrinology follow-up note   Subjective:    Patient ID: Melvin Taylor, male    DOB: 12-10-1965, PCP Rosita Fire, MD   Past Medical History:  Diagnosis Date  . Anxiety   . Bipolar 1 disorder ([place])   . Bone spur    left heel  . Cervical radiculopathy   . Chest pain   . Chronic back pain   . Chronic chest wall pain   . Chronic neck pain   . Coronary artery disease    a. s/p CABG in 03/2016 with LIMA-LAD, SVG-D1, SVG-RCA, and Seq SVG-mid and distal OM  . Depression   . Diabetes mellitus    Type II  . Hallucinations    "long history of them"  . Headache(784.0)   . History of gout   . HTN (hypertension)   . Insomnia   . Myocardial infarction ([place]) 2017  . Neuropathy   . Pain management   . Polysubstance abuse ([place])   . Right leg pain    chronic  . Schizophrenia ([place])   . Seizures ([place])    last sz between July 5-9th, 2016; epilepsy  . Shortness of breath dyspnea    with exertion  . Sleep apnea   . Transfusion of blood product refused for religious reason    Past Surgical History:  Procedure Laterality Date  . ANTERIOR CERVICAL DECOMP/DISCECTOMY FUSION N/A 12/14/2015   Procedure: ANTERIOR CERVICAL DECOMPRESSION/DISCECTOMY FUSION CERVICAL FIVE -SIX;  Surgeon: Earnie Larsson, MD;  Location: [facility];  Service: Neurosurgery;  Laterality: N/A;  . BIOPSY  05/07/2015   Procedure: BIOPSY (Gastric);  Surgeon: [name], MD;  Location: [facility];  Service: Endoscopy;;  . CARDIAC CATHETERIZATION N/A 03/07/2016   Procedure: Left Heart Cath and Coronary Angiography;  Surgeon: Belva Crome, MD;  Location: [facility];  Service: Cardiovascular;  Laterality: N/A;  . COLONOSCOPY WITH PROPOFOL N/A 05/07/2015   EEF:[id number] diverticulosis, multiple colon polyps removed, tubular adenoma, serrated colon polyp. Next colonoscopy October 2019  . COLONOSCOPY WITH PROPOFOL N/A 08/02/2018   Procedure: COLONOSCOPY WITH PROPOFOL;  Surgeon: [name], MD;  [facility];  Service: Endoscopy;  Laterality: N/A;  12:00pm  . CORONARY ARTERY BYPASS GRAFT N/A 03/28/2016   Procedure: CORONARY ARTERY BYPASS GRAFTING (CABG) x 5 USING GREATER SAPHENOUS VEIN;  Surgeon: [name], MD;  Location: [facility];  Service: Open Heart Surgery;  Laterality: N/A;  . ENDOVEIN HARVEST OF GREATER SAPHENOUS VEIN Right 03/28/2016   Procedure: ENDOVEIN HARVEST OF GREATER SAPHENOUS VEIN;  Surgeon: [name], MD;  Location: [facility];  Service: Open Heart Surgery;  Laterality: Right;  . ESOPHAGEAL DILATION N/A 05/07/2015   Procedure: ESOPHAGEAL DILATION WITH 56FR MALONEY DILATOR;  Surgeon: [name], MD;  Location: [facility];  Service: Endoscopy;  Laterality: N/A;  . ESOPHAGOGASTRODUODENOSCOPY (EGD) WITH PROPOFOL N/A 05/07/2015   RMR: Status post dilation of normal esophagus. Gastritis.  Marland Kitchen KNEE SURGERY Left    arthroscopy  . MANDIBLE FRACTURE SURGERY    . POLYPECTOMY  05/07/2015   Procedure: POLYPECTOMY (Hepatic Flexure, Distal Transverse Colon, Rectal);  Surgeon: [name], MD;  Location: [facility];  Service: Endoscopy;;  . TEE WITHOUT CARDIOVERSION N/A 03/28/2016   Procedure: TRANSESOPHAGEAL ECHOCARDIOGRAM (TEE);  Surgeon: [name], MD;  Location: [facility];  Service: Open Heart Surgery;  Laterality: N/A;  . TONSILLECTOMY     Social History   Socioeconomic History  . Marital status: Single  Spouse name: Not on file  . Number of children: Not on file  . Years of education: 11th grade  . Highest education level: Not on file  Occupational History  . Occupation: unemployed    Fish farm manager: NOT EMPLOYED  Social Needs  . Financial resource strain: Not on file  . Food insecurity    Worry: Not on file    Inability: Not on file  . Transportation needs    Medical: Not on file    Non-medical: Not on file  Tobacco Use  . Smoking status: Current Every Day Smoker    Packs/day: 1.00    Years: 30.00    Pack years: 30.00    Types: Cigarettes  . Smokeless tobacco: Never  Used  . Tobacco comment: 3 cigs per day  Substance and Sexual Activity  . Alcohol use: No  . Drug use: No  . Sexual activity: Not Currently  Lifestyle  . Physical activity    Days per week: Not on file    Minutes per session: Not on file  . Stress: Not on file  Relationships  . Social Herbalist on phone: Not on file    Gets together: Not on file    Attends religious service: Not on file    Active member of club or organization: Not on file    Attends meetings of clubs or organizations: Not on file    Relationship status: Not on file  Other Topics Concern  . Not on file  Social History Narrative   Lives alone   Drinks a cup of coffee a day   Outpatient Encounter Medications as of 06/21/2019  Medication Sig  . albuterol (PROVENTIL HFA;VENTOLIN HFA) 108 (90 Base) MCG/ACT inhaler Inhale 1-2 puffs into the lungs every 6 (six) hours as needed for wheezing or shortness of breath.  Marland Kitchen amLODipine (NORVASC) 10 MG tablet Take 1 tablet (10 mg total) by mouth daily. Pt needs appt to continue to receive refills  . atorvastatin (LIPITOR) 40 MG tablet Take 1 tablet (40 mg total) by mouth daily.  . Blood Glucose Monitoring Suppl (ACCU-CHEK GUIDE) w/Device KIT 1 Piece by Does not apply route as directed.  . clopidogrel (PLAVIX) 75 MG tablet Take 75 mg by mouth daily.   . diazepam (VALIUM) 5 MG tablet Take 5 mg by mouth 2 (two) times daily as needed for anxiety. Can take 2 tablets at bedtime  . diclofenac sodium (VOLTAREN) 1 % GEL Apply 2 g topically 4 (four) times daily. Rub into affected area of foot 2 to 4 times daily  . divalproex (DEPAKOTE) 500 MG DR tablet Take 3 tablets (1,500 mg total) by mouth 2 (two) times daily.  Marland Kitchen escitalopram (LEXAPRO) 10 MG tablet Take 10 mg by mouth daily.  Marland Kitchen glucose blood (ACCU-CHEK GUIDE) test strip Use as instructed  . insulin aspart (NOVOLOG FLEXPEN) 100 UNIT/ML FlexPen Inject 5-8 Units into the skin 3 (three) times daily with meals.  . Insulin  Glargine (LANTUS SOLOSTAR) 100 UNIT/ML Solostar Pen Inject 24 Units into the skin at bedtime.  Marland Kitchen ketorolac (TORADOL) 10 MG tablet Take 1 tablet (10 mg total) by mouth every 6 (six) hours as needed.  Marland Kitchen lisinopril (PRINIVIL,ZESTRIL) 10 MG tablet Take 1 tablet (10 mg total) by mouth daily.  . metoprolol tartrate (LOPRESSOR) 25 MG tablet Take 0.5 tablets (12.5 mg total) by mouth 2 (two) times daily.  . nitroGLYCERIN (NITROSTAT) 0.4 MG SL tablet Place 1 tablet (0.4 mg total) under the tongue  every 5 (five) minutes as needed for chest pain.  Marland Kitchen oxyCODONE-acetaminophen (PERCOCET) 10-325 MG tablet Take 1 tablet by mouth every 8 (eight) hours as needed for up to 5 days for pain.  Marland Kitchen RANEXA 500 MG 12 hr tablet TAKE (1) TABLET BY MOUTH TWICE DAILY.  Marland Kitchen risperiDONE (RISPERDAL) 2 MG tablet Take 2 mg by mouth at bedtime.  . [DISCONTINUED] Insulin Glargine (LANTUS SOLOSTAR) 100 UNIT/ML Solostar Pen Inject 20 Units into the skin at bedtime.  . [DISCONTINUED] paliperidone (INVEGA) 6 MG 24 hr tablet Take 12 mg by mouth daily.   No facility-administered encounter medications on file as of 06/21/2019.    ALLERGIES: Allergies  Allergen Reactions  . Aspirin Swelling and Other (See Comments)    Facial swelling    VACCINATION STATUS: Immunization History  Administered Date(s) Administered  . Influenza,trivalent, recombinat, inj, PF 05/02/2015  . Tdap 01/18/2015    Diabetes He presents for his follow-up diabetic visit. He has type 2 diabetes mellitus. Onset time: He was diagnosed the approximate age of 53 years. His disease course has been improving. There are no hypoglycemic associated symptoms. Pertinent negatives for hypoglycemia include no confusion, headaches, pallor or seizures. Pertinent negatives for diabetes include no chest pain, no fatigue, no polyphagia and no weakness. There are no hypoglycemic complications. Symptoms are improving. Diabetic complications include heart disease. (He is seriously  noncompliant patient. He is now diagnosed with coronary artery disease at [facility] since last visit. He is awaiting for his outpatient cardiology evaluation. This assay to be set for 03/24/2016.) Risk factors for coronary artery disease include diabetes mellitus, dyslipidemia, hypertension, male sex, tobacco exposure and sedentary lifestyle. Current diabetic treatment includes insulin injections. He is compliant with treatment none of the time. He is following a generally unhealthy diet. When asked about meal planning, he reported none. He has had a previous visit with a dietitian. He never participates in exercise. His home blood glucose trend is increasing steadily. His breakfast blood glucose range is generally 140-180 mg/dl. His lunch blood glucose range is generally 180-200 mg/dl. His dinner blood glucose range is generally 180-200 mg/dl. His bedtime blood glucose range is generally 180-200 mg/dl. His overall blood glucose range is 180-200 mg/dl. An ACE inhibitor/angiotensin II receptor blocker is not being taken.  Hyperlipidemia This is a chronic problem. The current episode started more than 1 year ago. Exacerbating diseases include diabetes. Pertinent negatives include no chest pain, myalgias or shortness of breath. Current antihyperlipidemic treatment includes statins. Risk factors for coronary artery disease include diabetes mellitus, dyslipidemia, hypertension, a sedentary lifestyle and male sex.  Hypertension Pertinent negatives include no chest pain, headaches, neck pain, palpitations or shortness of breath. Risk factors for coronary artery disease include smoking/tobacco exposure, diabetes mellitus, dyslipidemia and male gender.    Review of systems: Limited as above   Objective:    There were no vitals taken for this visit.  Wt Readings from Last 3 Encounters:  06/11/19 152 lb (68.9 kg)  05/07/19 151 lb 6.4 oz (68.7 kg)  04/11/19 149 lb (67.6 kg)      Chemistry (most  recent): Lab Results  Component Value Date   NA 134 (L) 02/12/2019   K 4.1 02/12/2019   CL 96 (L) 02/12/2019   CO2 29 02/12/2019   BUN 32 (H) 02/12/2019   CREATININE 1.78 (H) 02/12/2019   Diabetic Labs (most recent): Lab Results  Component Value Date   HGBA1C 9.7 (A) 06/11/2019   HGBA1C >14.0 02/22/2019   HGBA1C 6.1 (  H) 11/17/2016   Lipid Panel     Component Value Date/Time   CHOL 172 07/20/2018 1002   TRIG 93 07/20/2018 1002   HDL 78 07/20/2018 1002   CHOLHDL 2.0 03/22/2016 0853   VLDL 7 03/22/2016 0853   LDLCALC 75 07/20/2018 1002    Assessment & Plan:   1. Uncontrolled type 2 diabetes mellitus with complication, with long-term current use of insulin (HCC)  - diabetes is  complicated by Coronary artery disease , CKD , chronic heavy smoking, noncompliance and patient remains at a high risk for more acute and chronic complications of diabetes which include CAD, CVA, CKD, retinopathy, and neuropathy. These are all discussed in detail with the patient.  -He reporting near target glycemic profile since last visit: Fasting between 110-183, lunchtime between 118-179, presupper between 160-218, bedtime between 113-189 . -His most recent A1c was 9.7%, improving from  >14%.   -I had a long discussion with him about treatment course of type 2 diabetes in the pathology behind its complications. - I have re-counseled the patient on diet management   by adopting a carbohydrate restricted / protein rich  Diet.  - he  admits there is a room for improvement in his diet and drink choices. -  Suggestion is made for him to avoid simple carbohydrates  from his diet including Cakes, Sweet Desserts / Pastries, Ice Cream, Soda (diet and regular), Sweet Tea, Candies, Chips, Cookies, Sweet Pastries,  Store Bought Juices, Alcohol in Excess of  1-2 drinks a day, Artificial Sweeteners, Coffee Creamer, and "Sugar-free" Products. This will help patient to have stable blood glucose profile and  potentially avoid unintended weight gain.   - Patient is advised to stick to a routine mealtimes to eat 3 meals  a day and avoid unnecessary snacks ( to snack only to correct hypoglycemia).   - I have approached patient with the following individualized plan to manage diabetes and patient reluctantly accepts .  - He is urged to stay committed for proper monitoring of blood glucose for safe use of insulin.    -He is advised to increase his Lantus to 24 units nightly, continue NovoLog 5 units 3 times daily AC for pre-meal blood glucose above 90 mg,  Plus specific correction for readings above 150 mg per DL.    -He is advised to take only 10 units of Lantus the night before his scheduled bone spur  surgery -He is urged to start and continue monitoring blood glucose 4 times a day-before meals and at bedtime.  - He is not a good candidate for incretin therapy nor metformin/SG LT 2 inhibitors . - Patient specific target  for A1c; LDL, HDL, Triglycerides, and  Waist Circumference were discussed in detail.  2) BP/HTN: he is advised to home monitor blood pressure and report if > 140/90 on 2 separate readings.   He is advised to continue lisinopril 10 mg p.o. daily, amlodipine 10 mg p.o. daily.  3)  Hyperlipidemia: He is advised to continue atorvastatin 40 mg p.o. nightly.     4) Medical noncompliance: He has significant medical history of noncompliance, I advised him to stay focused on self-care.  5) Chronic Care/Health Maintenance:  -Patient  Is on Statin medications and encouraged to continue to follow up with Ophthalmology, Podiatrist at least yearly or according to recommendations, and advised to quit smoking. I have recommended yearly flu vaccine and pneumonia vaccination at least every 5 years; moderate intensity exercise for up to 150 minutes weekly; and  sleep for at least 7 hours a day. He is advised to continue follow-up with his PMD for primary care needs.  He is counseled extensively  for smoking cessation.  - Patient Care Time Today:  25 min, of which >50% was spent in  counseling and the rest reviewing his  current and  previous labs/studies, previous treatments, his blood glucose readings, and medications' doses and developing a plan for long-term care based on the latest recommendations for standards of care.   Lendon Ka Kidane participated in the discussions, expressed understanding, and voiced agreement with the above plans.  All questions were answered to his satisfaction. he is encouraged to contact clinic should he have any questions or concerns prior to his return visit.    Follow up plan: -Return in about 2 weeks (around 07/05/2019) for Follow up with Meter and Logs Only - no Labs, Include 8 log sheets.  Glade Lloyd, MD Phone: [phone]  Fax: [phone]   06/21/2019, 12:52 PM

## 2019-06-25 ENCOUNTER — Ambulatory Visit: Payer: Medicaid Other | Admitting: "Endocrinology

## 2019-06-26 ENCOUNTER — Other Ambulatory Visit: Payer: Self-pay

## 2019-06-26 ENCOUNTER — Ambulatory Visit (INDEPENDENT_AMBULATORY_CARE_PROVIDER_SITE_OTHER): Payer: Medicaid Other | Admitting: Cardiovascular Disease

## 2019-06-26 ENCOUNTER — Encounter: Payer: Self-pay | Admitting: Cardiovascular Disease

## 2019-06-26 VITALS — BP 163/93 | HR 71 | Temp 96.6°F | Ht 67.0 in | Wt 154.0 lb

## 2019-06-26 DIAGNOSIS — E785 Hyperlipidemia, unspecified: Secondary | ICD-10-CM | POA: Diagnosis not present

## 2019-06-26 DIAGNOSIS — I25708 Atherosclerosis of coronary artery bypass graft(s), unspecified, with other forms of angina pectoris: Secondary | ICD-10-CM

## 2019-06-26 DIAGNOSIS — Z720 Tobacco use: Secondary | ICD-10-CM | POA: Diagnosis not present

## 2019-06-26 DIAGNOSIS — I10 Essential (primary) hypertension: Secondary | ICD-10-CM

## 2019-06-26 MED ORDER — LISINOPRIL 20 MG PO TABS: 20.0000 mg | ORAL_TABLET | Freq: Every day | ORAL | 3 refills | Status: DC

## 2019-06-26 NOTE — Patient Instructions (Signed)
Medication Instructions:  INCREASE LISINOPRIL TO 20 MG DAILY   Labwork: NONE  Testing/Procedures: NONE  Follow-Up: Your physician wants you to follow-up in: 1 YEAR.  You will receive a reminder letter in the mail two months in advance. If you don't receive a letter, please call our office to schedule the follow-up appointment.   Any Other Special Instructions Will Be Listed Below (If Applicable).     If you need a refill on your cardiac medications before your next appointment, please call your pharmacy.

## 2019-06-26 NOTE — Progress Notes (Signed)
SUBJECTIVE: The patient presents for follow-up of coronary artery disease. He underwent 5 vessel CABG on 03/28/16  (LIMA-LAD, SVG-D1, SVG-RCA, and Seq SVG-mid and distal OM).  Ever since bypass surgery, he has struggled with musculoskeletal chest wall and left arm pain. It is exacerbated with movement and lifting heavy objects.  He has had numerous ED visits for chest pain.   He saw [place], PA-C on 02/20/2019 and she ordered a Lexiscan.  Lexiscan Myoview on 02/27/2019 demonstrated evidence for prior inferior myocardial infarction with no evidence of current ischemia.  LVEF was calculated at 32%.  Echocardiogram performed on 03/04/2019 demonstrated normal LV systolic function, LVEF 60 to 65%.  There was mild to moderate concentric LVH.  He is doing well from a cardiovascular standpoint.  His chief complaint relates to left foot pain.  He was diagnosed with left plantar fasciitis and is planning to undergo endoscopic plantar fasciotomy this upcoming Monday.  He is bit worried about this.    Review of Systems: As per "subjective", otherwise negative.  Allergies  Allergen Reactions  . Aspirin Swelling and Other (See Comments)    Facial swelling     Current Outpatient Medications  Medication Sig Dispense Refill  . albuterol (PROVENTIL HFA;VENTOLIN HFA) 108 (90 Base) MCG/ACT inhaler Inhale 1-2 puffs into the lungs every 6 (six) hours as needed for wheezing or shortness of breath.    Marland Kitchen amLODipine (NORVASC) 10 MG tablet Take 1 tablet (10 mg total) by mouth daily. Pt needs appt to continue to receive refills 90 tablet 1  . atorvastatin (LIPITOR) 40 MG tablet Take 1 tablet (40 mg total) by mouth daily. 90 tablet 3  . Blood Glucose Monitoring Suppl (ACCU-CHEK GUIDE) w/Device KIT 1 Piece by Does not apply route as directed. 1 kit 0  . clopidogrel (PLAVIX) 75 MG tablet Take 75 mg by mouth daily.     . diazepam (VALIUM) 5 MG tablet Take 5 mg by mouth 2 (two) times daily as needed  for anxiety. Can take 2 tablets at bedtime    . divalproex (DEPAKOTE) 500 MG DR tablet Take 3 tablets (1,500 mg total) by mouth 2 (two) times daily. 540 tablet 4  . escitalopram (LEXAPRO) 10 MG tablet Take 10 mg by mouth daily.    Marland Kitchen glucose blood (ACCU-CHEK GUIDE) test strip Use as instructed 150 each 2  . insulin aspart (NOVOLOG FLEXPEN) 100 UNIT/ML FlexPen Inject 5-8 Units into the skin 3 (three) times daily with meals.    . Insulin Glargine (LANTUS SOLOSTAR) 100 UNIT/ML Solostar Pen Inject 24 Units into the skin at bedtime. 5 pen 3  . ketorolac (TORADOL) 10 MG tablet Take 1 tablet (10 mg total) by mouth every 6 (six) hours as needed. 20 tablet 0  . lisinopril (PRINIVIL,ZESTRIL) 10 MG tablet Take 1 tablet (10 mg total) by mouth daily. 90 tablet 3  . metoprolol tartrate (LOPRESSOR) 25 MG tablet Take 0.5 tablets (12.5 mg total) by mouth 2 (two) times daily. 90 tablet 3  . nitroGLYCERIN (NITROSTAT) 0.4 MG SL tablet Place 1 tablet (0.4 mg total) under the tongue every 5 (five) minutes as needed for chest pain. 25 tablet 3  . RANEXA 500 MG 12 hr tablet TAKE (1) TABLET BY MOUTH TWICE DAILY. 60 tablet 11  . risperiDONE (RISPERDAL) 2 MG tablet Take 2 mg by mouth at bedtime.     No current facility-administered medications for this visit.     Past Medical History:  Diagnosis Date  .  Anxiety   . Bipolar 1 disorder ([place])   . Bone spur    left heel  . Cervical radiculopathy   . Chest pain   . Chronic back pain   . Chronic chest wall pain   . Chronic neck pain   . Coronary artery disease    a. s/p CABG in 03/2016 with LIMA-LAD, SVG-D1, SVG-RCA, and Seq SVG-mid and distal OM  . Depression   . Diabetes mellitus    Type II  . Hallucinations    "long history of them"  . Headache(784.0)   . History of gout   . HTN (hypertension)   . Insomnia   . Myocardial infarction ([place]) 2017  . Neuropathy   . Pain management   . Polysubstance abuse ([place])   . Right leg pain    chronic  . Schizophrenia  ([place])   . Seizures ([place])    last sz between July 5-9th, 2016; epilepsy  . Shortness of breath dyspnea    with exertion  . Sleep apnea   . Transfusion of blood product refused for religious reason     Past Surgical History:  Procedure Laterality Date  . ANTERIOR CERVICAL DECOMP/DISCECTOMY FUSION N/A 12/14/2015   Procedure: ANTERIOR CERVICAL DECOMPRESSION/DISCECTOMY FUSION CERVICAL FIVE -SIX;  Surgeon: Earnie Larsson, MD;  Location: [facility];  Service: Neurosurgery;  Laterality: N/A;  . BIOPSY  05/07/2015   Procedure: BIOPSY (Gastric);  Surgeon: [name], MD;  Location: [facility];  Service: Endoscopy;;  . CARDIAC CATHETERIZATION N/A 03/07/2016   Procedure: Left Heart Cath and Coronary Angiography;  Surgeon: Belva Crome, MD;  Location: [facility];  Service: Cardiovascular;  Laterality: N/A;  . COLONOSCOPY WITH PROPOFOL N/A 05/07/2015   [id number] diverticulosis, multiple colon polyps removed, tubular adenoma, serrated colon polyp. Next colonoscopy October 2019  . COLONOSCOPY WITH PROPOFOL N/A 08/02/2018   Procedure: COLONOSCOPY WITH PROPOFOL;  Surgeon: [name], MD;  Location: [facility];  Service: Endoscopy;  Laterality: N/A;  12:00pm  . CORONARY ARTERY BYPASS GRAFT N/A 03/28/2016   Procedure: CORONARY ARTERY BYPASS GRAFTING (CABG) x 5 USING GREATER SAPHENOUS VEIN;  Surgeon: [name], MD;  Location: [facility];  Service: Open Heart Surgery;  Laterality: N/A;  . ENDOVEIN HARVEST OF GREATER SAPHENOUS VEIN Right 03/28/2016   Procedure: ENDOVEIN HARVEST OF GREATER SAPHENOUS VEIN;  Surgeon: [name], MD;  Location: [facility];  Service: Open Heart Surgery;  Laterality: Right;  . ESOPHAGEAL DILATION N/A 05/07/2015   Procedure: ESOPHAGEAL DILATION WITH 56FR MALONEY DILATOR;  Surgeon: [name], MD;  Location: [facility];  Service: Endoscopy;  Laterality: N/A;  . ESOPHAGOGASTRODUODENOSCOPY (EGD) WITH PROPOFOL N/A 05/07/2015   RMR: Status post dilation of normal esophagus.  Gastritis.  Marland Kitchen KNEE SURGERY Left    arthroscopy  . MANDIBLE FRACTURE SURGERY    . POLYPECTOMY  05/07/2015   Procedure: POLYPECTOMY (Hepatic Flexure, Distal Transverse Colon, Rectal);  Surgeon: [name], MD;  Location: [facility];  Service: Endoscopy;;  . TEE WITHOUT CARDIOVERSION N/A 03/28/2016   Procedure: TRANSESOPHAGEAL ECHOCARDIOGRAM (TEE);  Surgeon: [name], MD;  Location: [facility];  Service: Open Heart Surgery;  Laterality: N/A;  . TONSILLECTOMY      Social History   Socioeconomic History  . Marital status: Single    Spouse name: Not on file  . Number of children: Not on file  . Years of education: 11th grade  . Highest education level: Not on file  Occupational History  .  Occupation: unemployed    Fish farm manager: NOT EMPLOYED  Social Needs  . Financial resource strain: Not on file  . Food insecurity    Worry: Not on file    Inability: Not on file  . Transportation needs    Medical: Not on file    Non-medical: Not on file  Tobacco Use  . Smoking status: Current Every Day Smoker    Packs/day: 0.50    Years: 30.00    Pack years: 15.00    Types: Cigarettes  . Smokeless tobacco: Never Used  . Tobacco comment: 3 cigs per day  Substance and Sexual Activity  . Alcohol use: No  . Drug use: No  . Sexual activity: Not Currently  Lifestyle  . Physical activity    Days per week: Not on file    Minutes per session: Not on file  . Stress: Not on file  Relationships  . Social Herbalist on phone: Not on file    Gets together: Not on file    Attends religious service: Not on file    Active member of club or organization: Not on file    Attends meetings of clubs or organizations: Not on file    Relationship status: Not on file  . Intimate partner violence    Fear of current or ex partner: Not on file    Emotionally abused: Not on file    Physically abused: Not on file    Forced sexual activity: Not on file  Other Topics Concern  . Not on file  Social History  Narrative   Lives alone   Drinks a cup of coffee a day     Vitals:   06/26/19 1344  BP: (!) 163/93  Pulse: 71  Temp: (!) 96.6 F (35.9 C)  TempSrc: Temporal  SpO2: 98%  Weight: 154 lb (69.9 kg)  Height: 5' 7"  (1.702 m)    Wt Readings from Last 3 Encounters:  06/26/19 154 lb (69.9 kg)  06/11/19 152 lb (68.9 kg)  05/07/19 151 lb 6.4 oz (68.7 kg)     PHYSICAL EXAM General: NAD HEENT: Normal. Neck: No JVD, no thyromegaly. Lungs: Clear to auscultation bilaterally with normal respiratory effort. CV: Regular rate and rhythm, normal S1/S2, no S3/S4, no murmur. No pretibial or periankle edema.  No carotid bruit.   Abdomen: Soft, nontender, no distention.  Neurologic: Alert and oriented.  Psych: Normal affect. Skin: Normal. Musculoskeletal: No gross deformities.      Labs: Lab Results  Component Value Date/Time   K 4.1 02/12/2019 02:01 PM   BUN 32 (H) 02/12/2019 02:01 PM   BUN 19 11/17/2016 10:17 AM   CREATININE 1.78 (H) 02/12/2019 02:01 PM   CREATININE 0.99 03/17/2016 02:36 PM   ALT 17 04/04/2018 09:16 AM   TSH 0.58 10/19/2015 10:06 AM   HGB 13.0 02/12/2019 02:01 PM     Lipids: Lab Results  Component Value Date/Time   LDLCALC 75 07/20/2018 10:02 AM   CHOL 172 07/20/2018 10:02 AM   TRIG 93 07/20/2018 10:02 AM   HDL 78 07/20/2018 10:02 AM       ASSESSMENT AND PLAN: 1.  Coronary artery disease: S/p 5-v CABG 03/2016.Symptomatically stable.  Lexiscan Myoview on 02/27/2019 demonstrated evidence for prior inferior myocardial infarction with no evidence of current ischemia.  LVEF was calculated at 32%. Echocardiogram performed on 03/04/2019 demonstrated normal LV systolic function, LVEF 60 to 65%.  There was mild to moderate concentric LVH. Continue Plavix (allergic to ASA),metoprolol, atorvastatin  and Ranexa 500 mg bid.  2. Hypertension:This remains elevated.   I will increase lisinopril to 20 mg daily.  3. Hyperlipidemia: Continue atorvastatin 40 mg.    4.   Tobacco use: Smokes less than a half pack of cigarettes daily.     Disposition: Follow up 1 yr   Kate Sable, M.D., F.A.C.C.

## 2019-07-01 DIAGNOSIS — M722 Plantar fascial fibromatosis: Secondary | ICD-10-CM

## 2019-07-01 MED ORDER — IBUPROFEN 800 MG PO TABS: 800.0000 mg | ORAL_TABLET | Freq: Four times a day (QID) | ORAL | 1 refills | Status: DC | PRN

## 2019-07-01 MED ORDER — OXYCODONE-ACETAMINOPHEN 10-325 MG PO TABS: 1.0000 | ORAL_TABLET | Freq: Four times a day (QID) | ORAL | 0 refills | Status: AC | PRN

## 2019-07-01 NOTE — Addendum Note (Signed)
Addended by: Boneta Lucks on: 07/01/2019 10:20 AM   Modules accepted: Orders

## 2019-07-02 IMAGING — DX DG CHEST 2V
2 series · 2 of 2 positions shown · non-contrast
Comparison: August 03, 2017

CLINICAL DATA: Central chest pain radiating to the left arm this
morning.

EXAM:
CHEST  2 VIEW

[chest pa]
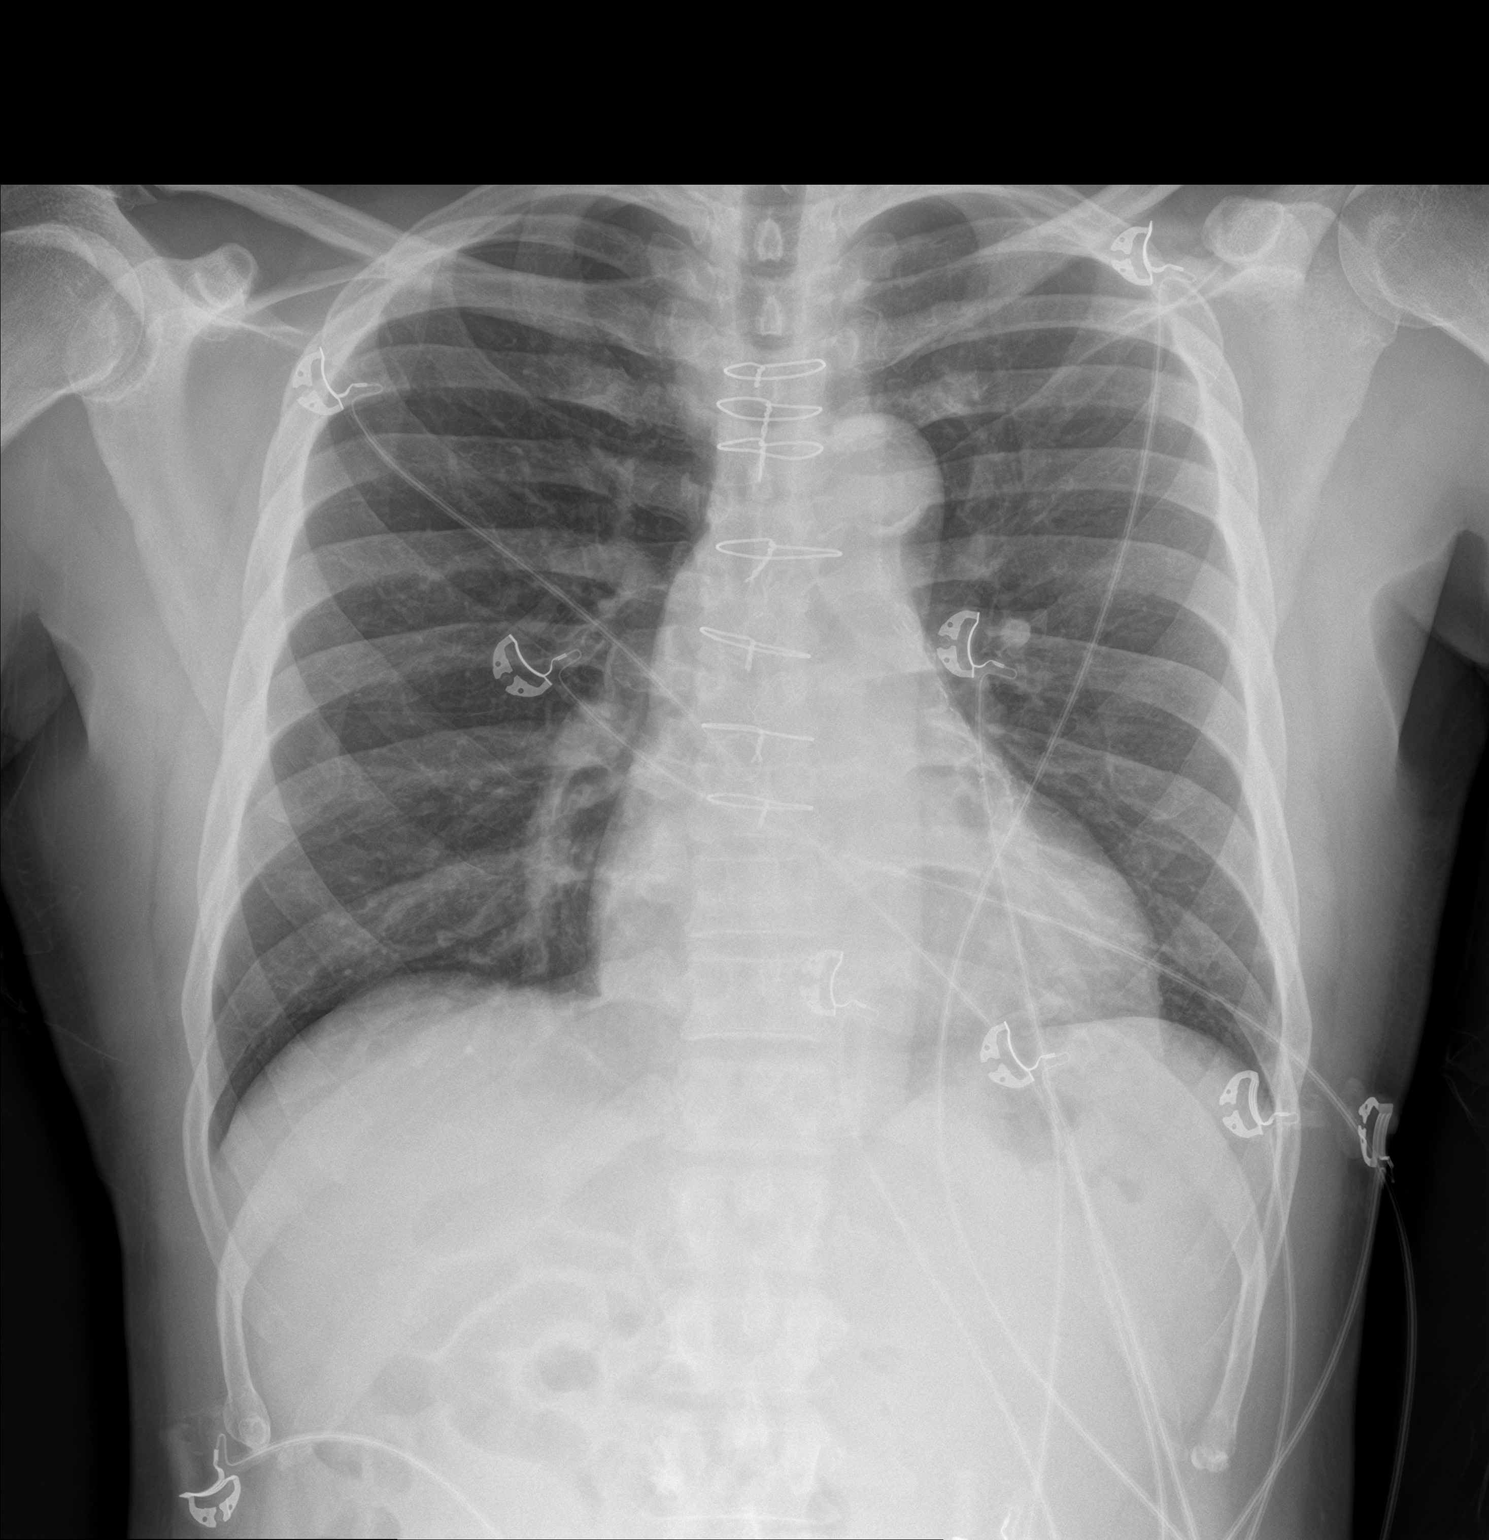

[chest lat]
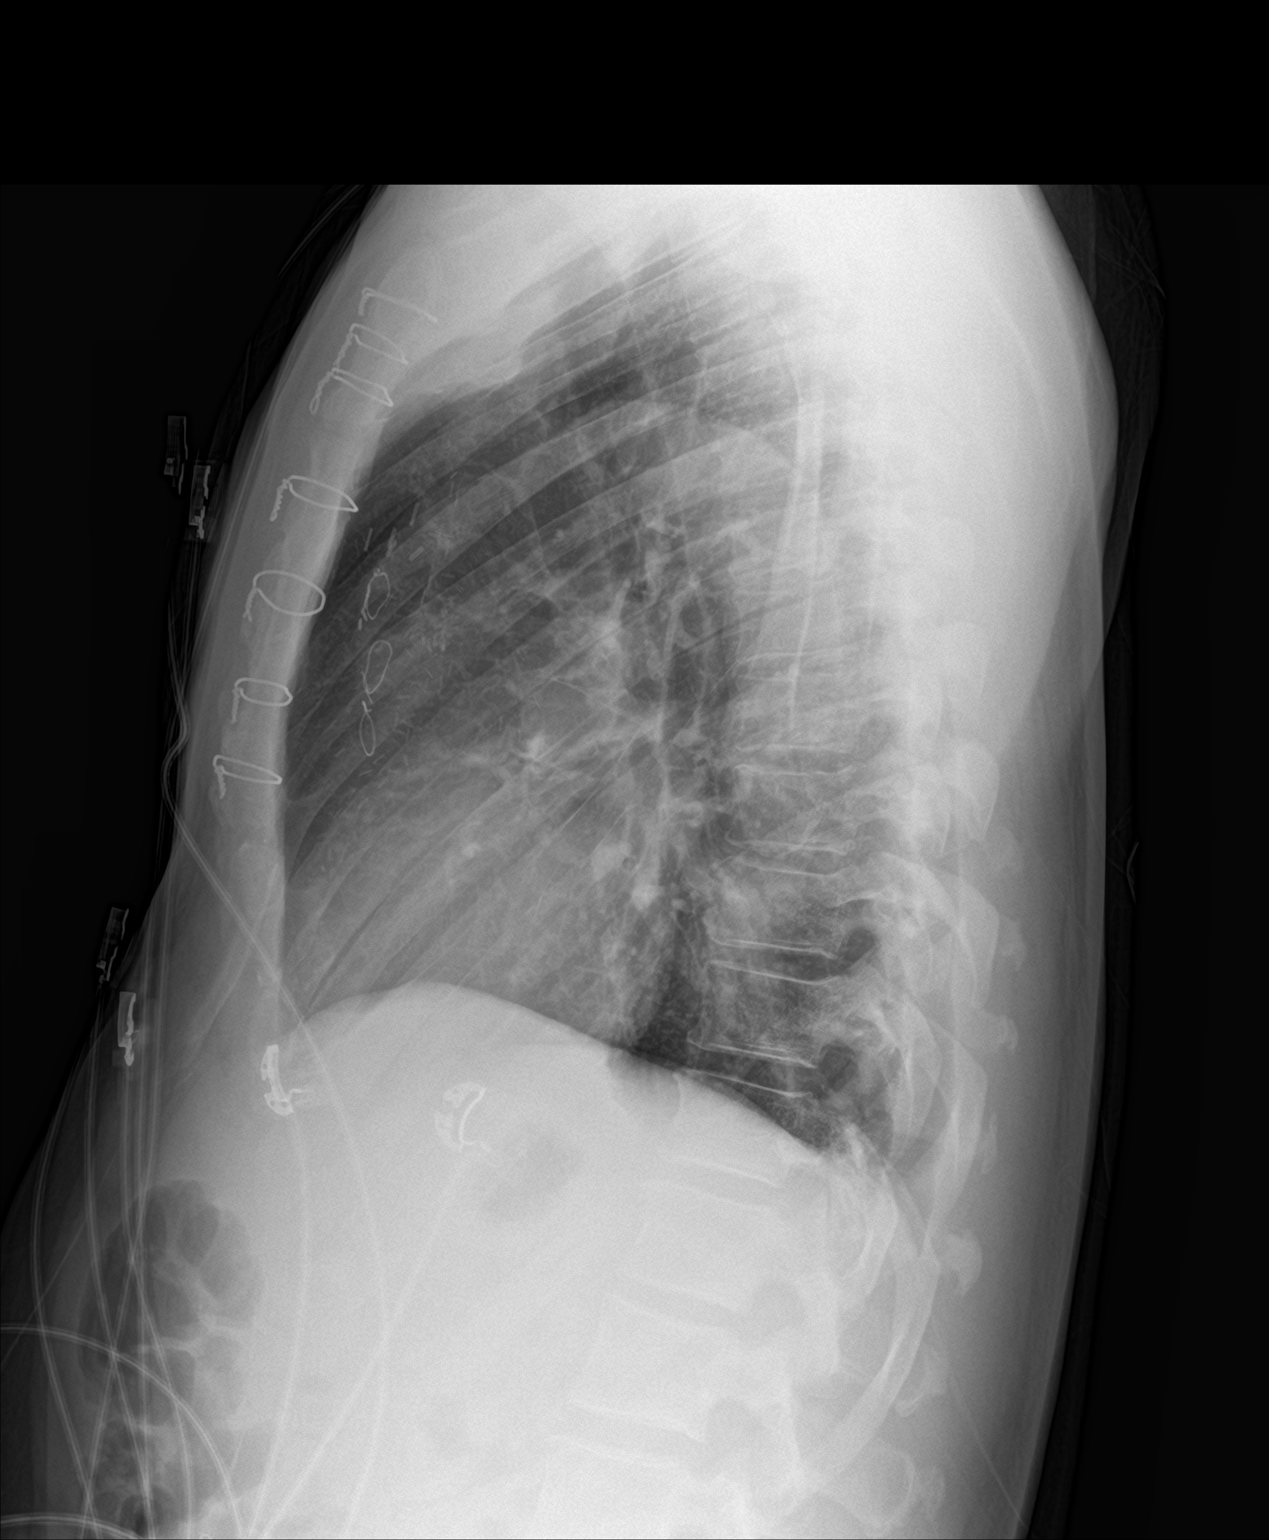

[2 of 2 positions shown; findings below may reference images not displayed]

FINDINGS: The heart size and mediastinal contours are within normal limits.
There is no focal infiltrate, pulmonary edema, or pleural effusion.
The visualized skeletal structures are stable.
IMPRESSION: No active cardiopulmonary disease.

## 2019-07-08 ENCOUNTER — Ambulatory Visit (INDEPENDENT_AMBULATORY_CARE_PROVIDER_SITE_OTHER): Payer: Medicaid Other | Admitting: "Endocrinology

## 2019-07-08 ENCOUNTER — Encounter: Payer: Self-pay | Admitting: "Endocrinology

## 2019-07-08 DIAGNOSIS — E782 Mixed hyperlipidemia: Secondary | ICD-10-CM

## 2019-07-08 DIAGNOSIS — E1159 Type 2 diabetes mellitus with other circulatory complications: Secondary | ICD-10-CM | POA: Diagnosis not present

## 2019-07-08 DIAGNOSIS — I10 Essential (primary) hypertension: Secondary | ICD-10-CM | POA: Diagnosis not present

## 2019-07-08 NOTE — Progress Notes (Signed)
07/08/2019                                                     Endocrinology Telehealth Visit Follow up Note -During COVID -19 Pandemic  This visit type was conducted due to national recommendations for restrictions regarding the COVID-19 Pandemic  in an effort to limit this patient's exposure and mitigate transmission of the corona virus.  Due to his co-morbid illnesses, ONUR MORI is at  moderate to high risk for complications without adequate follow up.  This format is felt to be most appropriate for him at this time.  I connected with this patient on 07/08/2019   by telephone and verified that I am speaking with the correct person using two identifiers. Lendon Ka Thede, [birth year]. he has verbally consented to this visit. All issues noted in this document were discussed and addressed. The format was not optimal for physical exam.     Subjective:    Patient ID: [name], male    DOB: [birth year], PCP Rosita Fire, MD   Past Medical History:  Diagnosis Date  . Anxiety   . Bipolar 1 disorder ([place])   . Bone spur    left heel  . Cervical radiculopathy   . Chest pain   . Chronic back pain   . Chronic chest wall pain   . Chronic neck pain   . Coronary artery disease    a. s/p CABG in 03/2016 with LIMA-LAD, SVG-D1, SVG-RCA, and Seq SVG-mid and distal OM  . Depression   . Diabetes mellitus    Type II  . Hallucinations    "long history of them"  . Headache(784.0)   . History of gout   . HTN (hypertension)   . Insomnia   . Myocardial infarction ([place]) 2017  . Neuropathy   . Pain management   . Polysubstance abuse ([place])   . Right leg pain    chronic  . Schizophrenia ([place])   . Seizures ([place])    last sz between July 5-9th, 2016; epilepsy  . Shortness of breath dyspnea    with exertion  . Sleep apnea   . Transfusion of blood product refused for religious reason    Past Surgical History:  Procedure Laterality Date  . ANTERIOR CERVICAL DECOMP/DISCECTOMY FUSION N/A  12/14/2015   Procedure: ANTERIOR CERVICAL DECOMPRESSION/DISCECTOMY FUSION CERVICAL FIVE -SIX;  Surgeon: Earnie Larsson, MD;  Location: [facility];  Service: Neurosurgery;  Laterality: N/A;  . BIOPSY  05/07/2015   Procedure: BIOPSY (Gastric);  Surgeon: [name], MD;  Location: [facility];  Service: Endoscopy;;  . CARDIAC CATHETERIZATION N/A 03/07/2016   Procedure: Left Heart Cath and Coronary Angiography;  Surgeon: Belva Crome, MD;  Location: [facility];  Service: Cardiovascular;  Laterality: N/A;  . COLONOSCOPY WITH PROPOFOL N/A 05/07/2015   GTX:MIWOEHOZYY diverticulosis, multiple colon polyps removed, tubular adenoma, serrated colon polyp. Next colonoscopy October 2019  . COLONOSCOPY WITH PROPOFOL N/A 08/02/2018   Procedure: COLONOSCOPY WITH PROPOFOL;  Surgeon: [name], MD;  Location: [facility];  Service: Endoscopy;  Laterality: N/A;  12:00pm  . CORONARY ARTERY BYPASS GRAFT N/A 03/28/2016   Procedure: CORONARY ARTERY BYPASS GRAFTING (CABG) x 5 USING GREATER SAPHENOUS VEIN;  Surgeon: [name], MD;  Location: [facility];  Service: Open Heart Surgery;  Laterality: N/A;  . ENDOVEIN HARVEST OF GREATER SAPHENOUS VEIN Right 03/28/2016   Procedure: ENDOVEIN HARVEST OF GREATER SAPHENOUS VEIN;  Surgeon: [name], MD;  Location: [facility];  Service: Open Heart Surgery;  Laterality: Right;  . ESOPHAGEAL DILATION N/A 05/07/2015   Procedure: ESOPHAGEAL DILATION WITH 56FR MALONEY DILATOR;  Surgeon: [name], MD;  Location: [facility];  Service: Endoscopy;  Laterality: N/A;  . ESOPHAGOGASTRODUODENOSCOPY (EGD) WITH PROPOFOL N/A 05/07/2015   RMR: Status post dilation of normal esophagus. Gastritis.  Marland Kitchen KNEE SURGERY Left    arthroscopy  . MANDIBLE FRACTURE SURGERY    . POLYPECTOMY  05/07/2015   Procedure: POLYPECTOMY (Hepatic Flexure, Distal Transverse Colon, Rectal);  Surgeon: [name], MD;  Location: [facility];  Service: Endoscopy;;  . TEE WITHOUT CARDIOVERSION N/A 03/28/2016   Procedure:  TRANSESOPHAGEAL ECHOCARDIOGRAM (TEE);  Surgeon: [name], MD;  Location: [facility];  Service: Open Heart Surgery;  Laterality: N/A;  . TONSILLECTOMY     Social History   Socioeconomic History  . Marital status: Single    Spouse name: Not on file  . Number of children: Not on file  . Years of education: 11th grade  . Highest education level: Not on file  Occupational History  . Occupation: unemployed    Fish farm manager: NOT EMPLOYED  Social Needs  . Financial resource strain: Not on file  . Food insecurity    Worry: Not on file    Inability: Not on file  . Transportation needs    Medical: Not on file    Non-medical: Not on file  Tobacco Use  . Smoking status: Current Every Day Smoker    Packs/day: 0.50    Years: 30.00    Pack years: 15.00    Types: Cigarettes  . Smokeless tobacco: Never Used  . Tobacco comment: 3 cigs per day  Substance and Sexual Activity  . Alcohol use: No  . Drug use: No  . Sexual activity: Not Currently  Lifestyle  . Physical activity    Days per week: Not on file    Minutes per session: Not on file  . Stress: Not on file  Relationships  . Social Herbalist on phone: Not on file    Gets together: Not on file    Attends religious service: Not on file    Active member of club or organization: Not on file    Attends meetings of clubs or organizations: Not on file    Relationship status: Not on file  Other Topics Concern  . Not on file  Social History Narrative   Lives alone   Drinks a cup of coffee a day   Outpatient Encounter Medications as of 07/08/2019  Medication Sig  . albuterol (PROVENTIL HFA;VENTOLIN HFA) 108 (90 Base) MCG/ACT inhaler Inhale 1-2 puffs into the lungs every 6 (six) hours as needed for wheezing or shortness of breath.  Marland Kitchen amLODipine (NORVASC) 10 MG tablet Take 1 tablet (10 mg total) by mouth daily. Pt needs appt to continue to receive refills  . atorvastatin (LIPITOR) 40 MG tablet Take 1 tablet (40 mg total) by mouth  daily.  . Blood Glucose Monitoring Suppl (ACCU-CHEK GUIDE) w/Device KIT 1 Piece by Does not apply route as directed.  . clopidogrel (PLAVIX) 75 MG tablet Take 75 mg by mouth daily.   . diazepam (VALIUM) 5 MG tablet Take 5 mg by mouth 2 (two) times daily as needed for anxiety. Can take 2 tablets at bedtime  .  divalproex (DEPAKOTE) 500 MG DR tablet Take 3 tablets (1,500 mg total) by mouth 2 (two) times daily.  Marland Kitchen escitalopram (LEXAPRO) 10 MG tablet Take 10 mg by mouth daily.  Marland Kitchen glucose blood (ACCU-CHEK GUIDE) test strip Use as instructed  . ibuprofen (ADVIL) 800 MG tablet Take 1 tablet (800 mg total) by mouth every 6 (six) hours as needed.  . insulin aspart (NOVOLOG FLEXPEN) 100 UNIT/ML FlexPen Inject 5-8 Units into the skin 3 (three) times daily with meals.  . Insulin Glargine (LANTUS SOLOSTAR) 100 UNIT/ML Solostar Pen Inject 24 Units into the skin at bedtime.  Marland Kitchen ketorolac (TORADOL) 10 MG tablet Take 1 tablet (10 mg total) by mouth every 6 (six) hours as needed.  Marland Kitchen lisinopril (ZESTRIL) 20 MG tablet Take 1 tablet (20 mg total) by mouth daily.  . metoprolol tartrate (LOPRESSOR) 25 MG tablet Take 0.5 tablets (12.5 mg total) by mouth 2 (two) times daily.  . nitroGLYCERIN (NITROSTAT) 0.4 MG SL tablet Place 1 tablet (0.4 mg total) under the tongue every 5 (five) minutes as needed for chest pain.  Marland Kitchen oxyCODONE-acetaminophen (PERCOCET) 10-325 MG tablet Take 1 tablet by mouth every 6 (six) hours as needed for up to 8 days for pain.  Marland Kitchen RANEXA 500 MG 12 hr tablet TAKE (1) TABLET BY MOUTH TWICE DAILY.  Marland Kitchen risperiDONE (RISPERDAL) 2 MG tablet Take 2 mg by mouth at bedtime.  . [DISCONTINUED] paliperidone (INVEGA) 6 MG 24 hr tablet Take 12 mg by mouth daily.   No facility-administered encounter medications on file as of 07/08/2019.    ALLERGIES: Allergies  Allergen Reactions  . Aspirin Swelling and Other (See Comments)    Facial swelling    VACCINATION STATUS: Immunization History  Administered Date(s)  Administered  . Influenza,trivalent, recombinat, inj, PF 05/02/2015  . Tdap 01/18/2015    Diabetes He presents for his follow-up diabetic visit. He has type 2 diabetes mellitus. Onset time: He was diagnosed the approximate age of 70 years. His disease course has been stable. There are no hypoglycemic associated symptoms. Pertinent negatives for hypoglycemia include no confusion, headaches, pallor or seizures. Pertinent negatives for diabetes include no chest pain, no fatigue, no polyphagia and no weakness. There are no hypoglycemic complications. Symptoms are stable. Diabetic complications include heart disease. (He is seriously noncompliant patient. He is now diagnosed with coronary artery disease at [facility] since last visit. He is awaiting for his outpatient cardiology evaluation. This assay to be set for 03/24/2016.) Risk factors for coronary artery disease include diabetes mellitus, dyslipidemia, hypertension, male sex, tobacco exposure and sedentary lifestyle. Current diabetic treatment includes insulin injections. He is compliant with treatment none of the time. He is following a generally unhealthy diet. When asked about meal planning, he reported none. He has had a previous visit with a dietitian. He never participates in exercise. His home blood glucose trend is increasing steadily. His breakfast blood glucose range is generally 130-140 mg/dl. His lunch blood glucose range is generally 140-180 mg/dl. His dinner blood glucose range is generally 140-180 mg/dl. His bedtime blood glucose range is generally 140-180 mg/dl. His overall blood glucose range is 140-180 mg/dl. An ACE inhibitor/angiotensin II receptor blocker is not being taken.  Hyperlipidemia This is a chronic problem. The current episode started more than 1 year ago. Exacerbating diseases include diabetes. Pertinent negatives include no chest pain, myalgias or shortness of breath. Current antihyperlipidemic treatment includes  statins. Risk factors for coronary artery disease include diabetes mellitus, dyslipidemia, hypertension, a sedentary lifestyle and male  sex.  Hypertension Pertinent negatives include no chest pain, headaches, neck pain, palpitations or shortness of breath. Risk factors for coronary artery disease include smoking/tobacco exposure, diabetes mellitus, dyslipidemia and male gender.    Review of systems: Limited as above   Objective:    There were no vitals taken for this visit.  Wt Readings from Last 3 Encounters:  06/26/19 154 lb (69.9 kg)  06/11/19 152 lb (68.9 kg)  05/07/19 151 lb 6.4 oz (68.7 kg)      Chemistry (most recent): Lab Results  Component Value Date   NA 134 (L) 02/12/2019   K 4.1 02/12/2019   CL 96 (L) 02/12/2019   CO2 29 02/12/2019   BUN 32 (H) 02/12/2019   CREATININE 1.78 (H) 02/12/2019   Diabetic Labs (most recent): Lab Results  Component Value Date   HGBA1C 9.7 (A) 06/11/2019   HGBA1C >14.0 02/22/2019   HGBA1C 6.1 (H) 11/17/2016   Lipid Panel     Component Value Date/Time   CHOL 172 07/20/2018 1002   TRIG 93 07/20/2018 1002   HDL 78 07/20/2018 1002   CHOLHDL 2.0 03/22/2016 0853   VLDL 7 03/22/2016 0853   LDLCALC 75 07/20/2018 1002    Assessment & Plan:   1. Uncontrolled type 2 diabetes mellitus with complication, with long-term current use of insulin (HCC)  - diabetes is  complicated by Coronary artery disease , CKD , chronic heavy smoking, noncompliance and patient remains at a high risk for more acute and chronic complications of diabetes which include CAD, CVA, CKD, retinopathy, and neuropathy. These are all discussed in detail with the patient.  -He is reporting controlled and near target glycemic profile between 108-189 fasting, between 155-192 at lunch, between 124-164 at supper.    -His most recent A1c was 9.7%, improving from  >14%.   -I had a long discussion with him about treatment course of type 2 diabetes in the pathology behind its  complications. - I have re-counseled the patient on diet management   by adopting a carbohydrate restricted / protein rich  Diet.  - he  admits there is a room for improvement in his diet and drink choices. -  Suggestion is made for him to avoid simple carbohydrates  from his diet including Cakes, Sweet Desserts / Pastries, Ice Cream, Soda (diet and regular), Sweet Tea, Candies, Chips, Cookies, Sweet Pastries,  Store Bought Juices, Alcohol in Excess of  1-2 drinks a day, Artificial Sweeteners, Coffee Creamer, and "Sugar-free" Products. This will help patient to have stable blood glucose profile and potentially avoid unintended weight gain.   - Patient is advised to stick to a routine mealtimes to eat 3 meals  a day and avoid unnecessary snacks ( to snack only to correct hypoglycemia).   - I have approached patient with the following individualized plan to manage diabetes and patient reluctantly accepts .  - He is urged to stay committed for proper monitoring of blood glucose for safe use of insulin.    -He is advised to continue Lantus 24 units at bedtime,  continue NovoLog 5 units 3 times daily AC for pre-meal blood glucose above 90 mg,  Plus specific correction for readings above 150 mg per DL.    -He is urged to start and continue monitoring blood glucose 4 times a day-before meals and at bedtime.  - He is not a good candidate for incretin therapy nor metformin/SG LT 2 inhibitors . - Patient specific target  for A1c; LDL, HDL, Triglycerides,  and  Waist Circumference were discussed in detail.  2) BP/HTN: he is advised to home monitor blood pressure and report if > 140/90 on 2 separate readings.    He is advised to continue lisinopril 10 mg p.o. daily, amlodipine 10 mg p.o. daily.  3)  Hyperlipidemia: He is advised to continue atorvastatin 40 mg p.o. nightly.     4) Medical noncompliance: He has significant medical history of noncompliance, I advised him to stay focused on  self-care.  5) Chronic Care/Health Maintenance:  -Patient  Is on Statin medications and encouraged to continue to follow up with Ophthalmology, Podiatrist at least yearly or according to recommendations, and advised to quit smoking. I have recommended yearly flu vaccine and pneumonia vaccination at least every 5 years; moderate intensity exercise for up to 150 minutes weekly; and  sleep for at least 7 hours a day. He is advised to continue follow-up with his PMD for primary care needs.  The patient was counseled on the dangers of tobacco use, and was advised to quit.  Reviewed strategies to maximize success, including removing cigarettes and smoking materials from environment.   He is advised to keep close follow-up with his PMD Dr. Legrand Rams.  - Patient Care Time Today:  25 min, of which >50% was spent in  counseling and the rest reviewing his  current and  previous labs/studies, previous treatments, his blood glucose readings, and medications' doses and developing a plan for long-term care based on the latest recommendations for standards of care.   Lendon Ka Janowski participated in the discussions, expressed understanding, and voiced agreement with the above plans.  All questions were answered to his satisfaction. he is encouraged to contact clinic should he have any questions or concerns prior to his return visit.  Follow up plan: -Return in about 3 months (around 10/06/2019) for Include 8 log sheets, Bring Meter and Logs- A1c in Office.  Glade Lloyd, MD Phone: [phone]  Fax: [phone]   07/08/2019, 1:57 PM

## 2019-07-09 ENCOUNTER — Ambulatory Visit: Payer: Medicaid Other | Admitting: "Endocrinology

## 2019-07-10 ENCOUNTER — Ambulatory Visit (INDEPENDENT_AMBULATORY_CARE_PROVIDER_SITE_OTHER): Payer: Medicaid Other | Admitting: Podiatry

## 2019-07-10 ENCOUNTER — Other Ambulatory Visit: Payer: Self-pay

## 2019-07-10 DIAGNOSIS — M722 Plantar fascial fibromatosis: Secondary | ICD-10-CM

## 2019-07-10 DIAGNOSIS — E1142 Type 2 diabetes mellitus with diabetic polyneuropathy: Secondary | ICD-10-CM

## 2019-07-10 DIAGNOSIS — M79672 Pain in left foot: Secondary | ICD-10-CM

## 2019-07-10 MED ORDER — HYDROCODONE-ACETAMINOPHEN 10-325 MG PO TABS: 1.0000 | ORAL_TABLET | Freq: Three times a day (TID) | ORAL | 0 refills | Status: AC | PRN

## 2019-07-11 ENCOUNTER — Encounter: Payer: Self-pay | Admitting: Podiatry

## 2019-07-11 NOTE — Progress Notes (Signed)
Subjective:  Patient ID: Melvin Taylor, male    DOB: 05/31/1966,  MRN: [id number]  Chief Complaint  Patient presents with  . Routine Post Op     POV#1 DOS 07/01/2019 EPF LT    53 y.o. male returns for post-op check.  Patient is doing well.  He states that his pain has considerably decreased.  He also mentions that he had multiple instances where his knee was giving out and he fell a couple of times.  However he states that he did not injure the surgical site at all.  But he does say that there is some tenderness right around the surgical site and he got a little bit worse after falling.  He denies any other acute complaints.  His bandages clean dry and intact.  No signs of infection.  He also states that pain medication is making him nauseous.  He would like another pain medication.  Review of Systems: Negative except as noted in the HPI. Denies N/V/F/Ch.  Past Medical History:  Diagnosis Date  . Anxiety   . Bipolar 1 disorder ([place])   . Bone spur    left heel  . Cervical radiculopathy   . Chest pain   . Chronic back pain   . Chronic chest wall pain   . Chronic neck pain   . Coronary artery disease    a. s/p CABG in 03/2016 with LIMA-LAD, SVG-D1, SVG-RCA, and Seq SVG-mid and distal OM  . Depression   . Diabetes mellitus    Type II  . Hallucinations    "long history of them"  . Headache(784.0)   . History of gout   . HTN (hypertension)   . Insomnia   . Myocardial infarction ([place]) 2017  . Neuropathy   . Pain management   . Polysubstance abuse ([place])   . Right leg pain    chronic  . Schizophrenia ([place])   . Seizures ([place])    last sz between July 5-9th, 2016; epilepsy  . Shortness of breath dyspnea    with exertion  . Sleep apnea   . Transfusion of blood product refused for religious reason     Current Outpatient Medications:  .  Accu-Chek FastClix Lancets MISC, USE TO TEST BLOOD SUGAR 4ETIMES A DAY., Disp: , Rfl:  .  albuterol (PROVENTIL HFA;VENTOLIN HFA) 108 (90  Base) MCG/ACT inhaler, Inhale 1-2 puffs into the lungs every 6 (six) hours as needed for wheezing or shortness of breath., Disp: , Rfl:  .  amLODipine (NORVASC) 10 MG tablet, Take 1 tablet (10 mg total) by mouth daily. Pt needs appt to continue to receive refills, Disp: 90 tablet, Rfl: 1 .  Blood Glucose Monitoring Suppl (ACCU-CHEK GUIDE) w/Device KIT, 1 Piece by Does not apply route as directed., Disp: 1 kit, Rfl: 0 .  clopidogrel (PLAVIX) 75 MG tablet, Take 75 mg by mouth daily. , Disp: , Rfl:  .  diazepam (VALIUM) 5 MG tablet, Take 5 mg by mouth 2 (two) times daily as needed for anxiety. Can take 2 tablets at bedtime, Disp: , Rfl:  .  divalproex (DEPAKOTE) 500 MG DR tablet, Take 3 tablets (1,500 mg total) by mouth 2 (two) times daily., Disp: 540 tablet, Rfl: 4 .  escitalopram (LEXAPRO) 10 MG tablet, Take 10 mg by mouth daily., Disp: , Rfl:  .  GEODON 40 MG capsule, Take 40 mg by mouth 2 (two) times daily., Disp: , Rfl:  .  glucose blood (ACCU-CHEK GUIDE) test strip, Use as instructed,  Disp: 150 each, Rfl: 2 .  HUMALOG 100 UNIT/ML injection, SMARTSIG:2 Unit(s) SUB-Q 3 Times Daily, Disp: , Rfl:  .  ibuprofen (ADVIL) 800 MG tablet, Take 1 tablet (800 mg total) by mouth every 6 (six) hours as needed., Disp: 60 tablet, Rfl: 1 .  insulin aspart (NOVOLOG FLEXPEN) 100 UNIT/ML FlexPen, Inject 5-8 Units into the skin 3 (three) times daily with meals., Disp: , Rfl:  .  Insulin Glargine (LANTUS SOLOSTAR) 100 UNIT/ML Solostar Pen, Inject 24 Units into the skin at bedtime., Disp: 5 pen, Rfl: 3 .  ketorolac (TORADOL) 10 MG tablet, Take 1 tablet (10 mg total) by mouth every 6 (six) hours as needed., Disp: 20 tablet, Rfl: 0 .  lamoTRIgine (LAMICTAL) 25 MG tablet, START WITH 1 TABLET DAILYATHEN INCREASE BY 1 TABLET EVERY OTHER WEEK TO GOAL OF 2 TABLETS TWICE DAILY., Disp: , Rfl:  .  lisinopril (ZESTRIL) 20 MG tablet, Take 1 tablet (20 mg total) by mouth daily., Disp: 90 tablet, Rfl: 3 .  Melatonin 10 MG CAPS, Take  1 capsule by mouth at bedtime., Disp: , Rfl:  .  RANEXA 500 MG 12 hr tablet, TAKE (1) TABLET BY MOUTH TWICE DAILY., Disp: 60 tablet, Rfl: 11 .  risperiDONE (RISPERDAL) 2 MG tablet, Take 2 mg by mouth at bedtime., Disp: , Rfl:  .  atorvastatin (LIPITOR) 40 MG tablet, Take 1 tablet (40 mg total) by mouth daily., Disp: 90 tablet, Rfl: 3 .  HYDROcodone-acetaminophen (NORCO) 10-325 MG tablet, Take 1 tablet by mouth every 8 (eight) hours as needed for up to 5 days., Disp: 15 tablet, Rfl: 0 .  metoprolol tartrate (LOPRESSOR) 25 MG tablet, Take 0.5 tablets (12.5 mg total) by mouth 2 (two) times daily., Disp: 90 tablet, Rfl: 3 .  nitroGLYCERIN (NITROSTAT) 0.4 MG SL tablet, Place 1 tablet (0.4 mg total) under the tongue every 5 (five) minutes as needed for chest pain., Disp: 25 tablet, Rfl: 3  Social History   Tobacco Use  Smoking Status Current Every Day Smoker  . Packs/day: 0.50  . Years: 30.00  . Pack years: 15.00  . Types: Cigarettes  Smokeless Tobacco Never Used  Tobacco Comment   3 cigs per day    Allergies  Allergen Reactions  . Aspirin Swelling and Other (See Comments)    Facial swelling    Objective:  There were no vitals filed for this visit. There is no height or weight on file to calculate BMI. Constitutional Well developed. Well nourished.  Vascular Foot warm and well perfused. Capillary refill normal to all digits.   Neurologic Normal speech. Oriented to person, place, and time. Epicritic sensation to light touch grossly present bilaterally.  Dermatologic Skin healing well without signs of infection. Skin edges well coapted without signs of infection.  Orthopedic: Tenderness to palpation noted about the surgical site.   Radiographs: None Assessment:   1. Plantar fasciitis   2. Left foot pain   3. Diabetic peripheral neuropathy associated with type 2 diabetes mellitus ([place])    Plan:  Patient was evaluated and treated and all questions answered.  S/p foot surgery  left -Progressing as expected post-operatively. -XR: None -WB Status: Weightbearing as tolerated in cam boot -Sutures: Intact.  No signs of dehiscence noted.  I will plan on removing this during next visit. -Medications: I changed the Percocet medication to Vicodin as patient states that in the past he was able to tolerate that better. -Foot redressed.  No follow-ups on file.

## 2019-07-17 ENCOUNTER — Other Ambulatory Visit: Payer: Self-pay

## 2019-07-17 ENCOUNTER — Emergency Department (HOSPITAL_COMMUNITY): Payer: Medicaid Other

## 2019-07-17 ENCOUNTER — Ambulatory Visit (INDEPENDENT_AMBULATORY_CARE_PROVIDER_SITE_OTHER): Payer: Medicaid Other | Admitting: Podiatry

## 2019-07-17 ENCOUNTER — Emergency Department (HOSPITAL_COMMUNITY)
Admission: EM | Admit: 2019-07-17 | Discharge: 2019-07-17 | Disposition: A | Discharge status: Home / Self Care | Payer: Medicaid Other | Attending: Emergency Medicine | Admitting: Emergency Medicine

## 2019-07-17 ENCOUNTER — Encounter (HOSPITAL_COMMUNITY): Payer: Self-pay | Admitting: Emergency Medicine

## 2019-07-17 ENCOUNTER — Encounter: Payer: Self-pay | Admitting: Podiatry

## 2019-07-17 DIAGNOSIS — I2510 Atherosclerotic heart disease of native coronary artery without angina pectoris: Secondary | ICD-10-CM | POA: Diagnosis not present

## 2019-07-17 DIAGNOSIS — E1122 Type 2 diabetes mellitus with diabetic chronic kidney disease: Secondary | ICD-10-CM | POA: Insufficient documentation

## 2019-07-17 DIAGNOSIS — M722 Plantar fascial fibromatosis: Secondary | ICD-10-CM

## 2019-07-17 DIAGNOSIS — R109 Unspecified abdominal pain: Secondary | ICD-10-CM | POA: Diagnosis not present

## 2019-07-17 DIAGNOSIS — R300 Dysuria: Secondary | ICD-10-CM | POA: Insufficient documentation

## 2019-07-17 DIAGNOSIS — I129 Hypertensive chronic kidney disease with stage 1 through stage 4 chronic kidney disease, or unspecified chronic kidney disease: Secondary | ICD-10-CM | POA: Insufficient documentation

## 2019-07-17 DIAGNOSIS — F17210 Nicotine dependence, cigarettes, uncomplicated: Secondary | ICD-10-CM | POA: Insufficient documentation

## 2019-07-17 DIAGNOSIS — Z794 Long term (current) use of insulin: Secondary | ICD-10-CM | POA: Insufficient documentation

## 2019-07-17 DIAGNOSIS — Z7902 Long term (current) use of antithrombotics/antiplatelets: Secondary | ICD-10-CM | POA: Diagnosis not present

## 2019-07-17 DIAGNOSIS — M79672 Pain in left foot: Secondary | ICD-10-CM

## 2019-07-17 DIAGNOSIS — E1142 Type 2 diabetes mellitus with diabetic polyneuropathy: Secondary | ICD-10-CM

## 2019-07-17 DIAGNOSIS — Z951 Presence of aortocoronary bypass graft: Secondary | ICD-10-CM | POA: Diagnosis not present

## 2019-07-17 DIAGNOSIS — Z79899 Other long term (current) drug therapy: Secondary | ICD-10-CM | POA: Insufficient documentation

## 2019-07-17 DIAGNOSIS — N183 Chronic kidney disease, stage 3 unspecified: Secondary | ICD-10-CM | POA: Diagnosis not present

## 2019-07-17 LAB — HEPATIC FUNCTION PANEL
ALT: 11 U/L (ref 0–44)
AST: 11 U/L — ABNORMAL LOW (ref 15–41)
Albumin: 2.7 g/dL — ABNORMAL LOW (ref 3.5–5.0)
Alkaline Phosphatase: 38 U/L (ref 38–126)
Bilirubin, Direct: <0.1 mg/dL (ref 0.0–0.2)
Total Bilirubin: 0.5 mg/dL (ref 0.3–1.2)
Total Protein: 5.4 g/dL — ABNORMAL LOW (ref 6.5–8.1)

## 2019-07-17 LAB — BASIC METABOLIC PANEL
Anion gap: 9 (ref 5–15)
BUN: 12 mg/dL (ref 6–20)
CO2: 25 mmol/L (ref 22–32)
Calcium: 8.8 mg/dL — ABNORMAL LOW (ref 8.9–10.3)
Chloride: 106 mmol/L (ref 98–111)
Creatinine, Ser: 1.26 mg/dL — ABNORMAL HIGH (ref 0.61–1.24)
GFR calc Af Amer: >60 mL/min (ref >60)
GFR calc non Af Amer: >60 mL/min (ref >60)
Glucose, Bld: 135 mg/dL — ABNORMAL HIGH (ref 70–99)
Potassium: 4.3 mmol/L (ref 3.5–5.1)
Sodium: 140 mmol/L (ref 135–145)

## 2019-07-17 LAB — CBC
HCT: 34.6 % — ABNORMAL LOW (ref 39.0–52.0)
Hemoglobin: 12.3 g/dL — ABNORMAL LOW (ref 13.0–17.0)
MCH: 31.7 pg (ref 26.0–34.0)
MCHC: 35.5 g/dL (ref 30.0–36.0)
MCV: 89.2 fL (ref 80.0–100.0)
Platelets: 211 10*3/uL (ref 150–400)
RBC: 3.88 MIL/uL — ABNORMAL LOW (ref 4.22–5.81)
RDW: 12.4 % (ref 11.5–15.5)
WBC: 5.7 10*3/uL (ref 4.0–10.5)
nRBC: 0 % (ref 0.0–0.2)

## 2019-07-17 LAB — RAPID URINE DRUG SCREEN, HOSP PERFORMED
Amphetamines: NOT DETECTED
Barbiturates: NOT DETECTED
Benzodiazepines: NOT DETECTED
Cocaine: POSITIVE — AB
Opiates: POSITIVE — AB
Tetrahydrocannabinol: POSITIVE — AB

## 2019-07-17 LAB — URINALYSIS, ROUTINE W REFLEX MICROSCOPIC
Bilirubin Urine: NEGATIVE
Glucose, UA: NEGATIVE mg/dL
Ketones, ur: NEGATIVE mg/dL
Leukocytes,Ua: NEGATIVE
Nitrite: NEGATIVE
Protein, ur: 100 mg/dL — AB
Specific Gravity, Urine: 1.015 (ref 1.005–1.030)
pH: 6 (ref 5.0–8.0)

## 2019-07-17 LAB — LIPASE, BLOOD: Lipase: 42 U/L (ref 11–51)

## 2019-07-17 MED ORDER — KETOROLAC TROMETHAMINE 30 MG/ML IJ SOLN
15.0000 mg | Freq: Once | INTRAMUSCULAR | Status: AC
  Administered 2019-07-17: 15:00:00 15 mg via INTRAMUSCULAR
  Filled 2019-07-17: qty 1

## 2019-07-17 MED ORDER — HYDROCODONE-ACETAMINOPHEN 5-325 MG PO TABS
1.0000 | ORAL_TABLET | Freq: Once | ORAL | Status: AC
  Administered 2019-07-17: 1 via ORAL
  Filled 2019-07-17: qty 1

## 2019-07-17 MED ORDER — CLONIDINE HCL 0.1 MG PO TABS
0.1000 mg | ORAL_TABLET | Freq: Once | ORAL | Status: AC
  Administered 2019-07-17: 13:00:00 0.1 mg via ORAL
  Filled 2019-07-17: qty 1

## 2019-07-17 NOTE — Progress Notes (Signed)
Subjective:  Patient ID: Melvin Taylor, male    DOB: Jan 25, 1966,  MRN: [id number]  Chief Complaint  Patient presents with  . Routine Post Op     POV#2 DOS 07/01/2019 EPF LT     53 y.o. male returns for post-op check.  He states that he is doing well.  He has mild tenderness to the incision site.  The sutures were removed today.  He states that he has been ambulating with a cam boot.  She denies any other acute complaints.  Review of Systems: Negative except as noted in the HPI. Denies N/V/F/Ch.  Past Medical History:  Diagnosis Date  . Anxiety   . Bipolar 1 disorder ([place])   . Bone spur    left heel  . Cervical radiculopathy   . Chest pain   . Chronic back pain   . Chronic chest wall pain   . Chronic neck pain   . Coronary artery disease    a. s/p CABG in 03/2016 with LIMA-LAD, SVG-D1, SVG-RCA, and Seq SVG-mid and distal OM  . Depression   . Diabetes mellitus    Type II  . Hallucinations    "long history of them"  . Headache(784.0)   . History of gout   . HTN (hypertension)   . Insomnia   . Myocardial infarction ([place]) 2017  . Neuropathy   . Pain management   . Polysubstance abuse ([place])   . Right leg pain    chronic  . Schizophrenia ([place])   . Seizures ([place])    last sz between July 5-9th, 2016; epilepsy  . Shortness of breath dyspnea    with exertion  . Sleep apnea   . Transfusion of blood product refused for religious reason     Current Outpatient Medications:  .  Accu-Chek FastClix Lancets MISC, USE TO TEST BLOOD SUGAR 4ETIMES A DAY., Disp: , Rfl:  .  albuterol (PROVENTIL HFA;VENTOLIN HFA) 108 (90 Base) MCG/ACT inhaler, Inhale 1-2 puffs into the lungs every 6 (six) hours as needed for wheezing or shortness of breath., Disp: , Rfl:  .  amLODipine (NORVASC) 10 MG tablet, Take 1 tablet (10 mg total) by mouth daily. Pt needs appt to continue to receive refills, Disp: 90 tablet, Rfl: 1 .  Blood Glucose Monitoring Suppl (ACCU-CHEK GUIDE) w/Device KIT, 1 Piece by  Does not apply route as directed., Disp: 1 kit, Rfl: 0 .  clopidogrel (PLAVIX) 75 MG tablet, Take 75 mg by mouth daily. , Disp: , Rfl:  .  diazepam (VALIUM) 5 MG tablet, Take 5 mg by mouth 2 (two) times daily as needed for anxiety. Can take 2 tablets at bedtime, Disp: , Rfl:  .  divalproex (DEPAKOTE) 500 MG DR tablet, Take 3 tablets (1,500 mg total) by mouth 2 (two) times daily., Disp: 540 tablet, Rfl: 4 .  escitalopram (LEXAPRO) 10 MG tablet, Take 10 mg by mouth daily., Disp: , Rfl:  .  GEODON 40 MG capsule, Take 40 mg by mouth 2 (two) times daily., Disp: , Rfl:  .  glucose blood (ACCU-CHEK GUIDE) test strip, Use as instructed, Disp: 150 each, Rfl: 2 .  HUMALOG 100 UNIT/ML injection, SMARTSIG:2 Unit(s) SUB-Q 3 Times Daily, Disp: , Rfl:  .  ibuprofen (ADVIL) 800 MG tablet, Take 1 tablet (800 mg total) by mouth every 6 (six) hours as needed., Disp: 60 tablet, Rfl: 1 .  insulin aspart (NOVOLOG FLEXPEN) 100 UNIT/ML FlexPen, Inject 5-8 Units into the skin 3 (three) times daily with meals.,  Disp: , Rfl:  .  Insulin Glargine (LANTUS SOLOSTAR) 100 UNIT/ML Solostar Pen, Inject 24 Units into the skin at bedtime., Disp: 5 pen, Rfl: 3 .  ketorolac (TORADOL) 10 MG tablet, Take 1 tablet (10 mg total) by mouth every 6 (six) hours as needed., Disp: 20 tablet, Rfl: 0 .  lamoTRIgine (LAMICTAL) 25 MG tablet, START WITH 1 TABLET DAILYATHEN INCREASE BY 1 TABLET EVERY OTHER WEEK TO GOAL OF 2 TABLETS TWICE DAILY., Disp: , Rfl:  .  lisinopril (ZESTRIL) 20 MG tablet, Take 1 tablet (20 mg total) by mouth daily., Disp: 90 tablet, Rfl: 3 .  Melatonin 10 MG CAPS, Take 1 capsule by mouth at bedtime., Disp: , Rfl:  .  RANEXA 500 MG 12 hr tablet, TAKE (1) TABLET BY MOUTH TWICE DAILY., Disp: 60 tablet, Rfl: 11 .  risperiDONE (RISPERDAL) 2 MG tablet, Take 2 mg by mouth at bedtime., Disp: , Rfl:  .  atorvastatin (LIPITOR) 40 MG tablet, Take 1 tablet (40 mg total) by mouth daily., Disp: 90 tablet, Rfl: 3 .  metoprolol tartrate  (LOPRESSOR) 25 MG tablet, Take 0.5 tablets (12.5 mg total) by mouth 2 (two) times daily., Disp: 90 tablet, Rfl: 3 .  nitroGLYCERIN (NITROSTAT) 0.4 MG SL tablet, Place 1 tablet (0.4 mg total) under the tongue every 5 (five) minutes as needed for chest pain., Disp: 25 tablet, Rfl: 3  Social History   Tobacco Use  Smoking Status Current Every Day Smoker  . Packs/day: 0.50  . Years: 30.00  . Pack years: 15.00  . Types: Cigarettes  Smokeless Tobacco Never Used  Tobacco Comment   3 cigs per day    Allergies  Allergen Reactions  . Aspirin Swelling and Other (See Comments)    Facial swelling    Objective:  There were no vitals filed for this visit. There is no height or weight on file to calculate BMI. Constitutional Well developed. Well nourished.  Vascular Foot warm and well perfused. Capillary refill normal to all digits.   Neurologic Normal speech. Oriented to person, place, and time. Epicritic sensation to light touch grossly present bilaterally.  Dermatologic Skin healing well without signs of infection. Skin edges well coapted without signs of infection.  Orthopedic: Tenderness to palpation noted about the surgical site.   Radiographs: None Assessment:   1. Diabetic peripheral neuropathy associated with type 2 diabetes mellitus ([name])   2. Plantar fasciitis   3. Left foot pain    Plan:  Patient was evaluated and treated and all questions answered.  S/p foot surgery left -Progressing as expected post-operatively. -XR: None -WB Status: Weightbearing as tolerated in cam boot -Sutures: Incision is intact.  No signs of dehiscence noted.  No clinical signs of infection noted. -Medications: None -Foot redressed.  No follow-ups on file.

## 2019-07-17 NOTE — ED Provider Notes (Signed)
[facility] EMERGENCY DEPARTMENT Provider Note   CSN: [id number] Arrival date & time: 07/17/19  1101     History Chief Complaint  Patient presents with  . Flank Pain    Melvin Taylor is a 53 y.o. male.  53yo M w/ PMH below including bipolar d/o, polysubstance abuse, CAD s/p CABG, seizures, chronic neck and back pain who presents with right flank pain.  Patient reports that he has had 3 to 4 days of right flank pain that sometimes wraps around to his abdomen and has recently been constant.  He reports associated dysuria.  He denies any nausea or vomiting and no fevers.  He reports that the flank pain is worse with deep inspiration but he denies any chest pain or shortness of breath.  Patient was hypertensive at triage.  He states that he took his blood pressure medications this morning.  He denies cocaine use.  The history is provided by the patient.  Flank Pain       Past Medical History:  Diagnosis Date  . Anxiety   . Bipolar 1 disorder ([place])   . Bone spur    left heel  . Cervical radiculopathy   . Chest pain   . Chronic back pain   . Chronic chest wall pain   . Chronic neck pain   . Coronary artery disease    a. s/p CABG in 03/2016 with LIMA-LAD, SVG-D1, SVG-RCA, and Seq SVG-mid and distal OM  . Depression   . Diabetes mellitus    Type II  . Hallucinations    "long history of them"  . Headache(784.0)   . History of gout   . HTN (hypertension)   . Insomnia   . Myocardial infarction ([place]) 2017  . Neuropathy   . Pain management   . Polysubstance abuse ([place])   . Right leg pain    chronic  . Schizophrenia ([place])   . Seizures ([place])    last sz between July 5-9th, 2016; epilepsy  . Shortness of breath dyspnea    with exertion  . Sleep apnea   . Transfusion of blood product refused for religious reason     Patient Active Problem List   Diagnosis Date Noted  . Current smoker 06/21/2019  . Seizure-like activity ([place]) 05/06/2019  . Type 2  diabetes mellitus with stage 3 chronic kidney disease, with long-term current use of insulin ([place]) 04/11/2019  . Anemia, chronic disease 07/04/2018  . Other specified diseases of the digestive system 10/30/2017  . CAD (coronary artery disease) 03/28/2016  . Chest pain 03/21/2016  . Mixed hyperlipidemia 03/11/2016  . Ischemic chest pain ([place])   . NSTEMI (non-ST elevated myocardial infarction) ([place])   . Coronary atherosclerosis of native coronary artery 03/03/2016  . Cervical spinal stenosis 12/14/2015  . Loss of weight 08/25/2015  . Essential hypertension, benign 07/23/2015  . Personal history of noncompliance with medical treatment, presenting hazards to health 07/23/2015  . Abdominal pain 06/08/2015  . Constipation 06/08/2015  . History of colonic polyps   . Diverticulosis of colon without hemorrhage   . Mucosal abnormality of stomach   . Encounter for screening colonoscopy 04/16/2015  . Dysphagia 04/16/2015  . Partial symptomatic epilepsy with complex partial seizures, intractable, without status epilepticus ([place]) 11/10/2014  . Bipolar disorder, unspecified ([place]) 10/15/2014  . Schizophrenia ([place]) 10/15/2014  . DM type 2 causing vascular disease ([place]) 06/29/2014  . Musculoskeletal pain 06/29/2014  . Hyperglycemia without ketosis 09/07/2013  . Degeneration disease of  medial meniscus 01/13/2011  . Other internal derangements of unspecified knee 01/13/2011  . Knee pain 12/28/2010  . Medial meniscus, posterior horn derangement 12/28/2010    Past Surgical History:  Procedure Laterality Date  . ANTERIOR CERVICAL DECOMP/DISCECTOMY FUSION N/A 12/14/2015   Procedure: ANTERIOR CERVICAL DECOMPRESSION/DISCECTOMY FUSION CERVICAL FIVE -SIX;  Surgeon: Earnie Larsson, MD;  Location: [facility];  Service: Neurosurgery;  Laterality: N/A;  . BIOPSY  05/07/2015   Procedure: BIOPSY (Gastric);  Surgeon: [name], MD;  Location: [facility];  Service: Endoscopy;;  . CARDIAC CATHETERIZATION N/A 03/07/2016     Procedure: Left Heart Cath and Coronary Angiography;  Surgeon: Belva Crome, MD;  Location: [facility];  Service: Cardiovascular;  Laterality: N/A;  . COLONOSCOPY WITH PROPOFOL N/A 05/07/2015   BWG:[id number] diverticulosis, multiple colon polyps removed, tubular adenoma, serrated colon polyp. Next colonoscopy October 2019  . COLONOSCOPY WITH PROPOFOL N/A 08/02/2018   Procedure: COLONOSCOPY WITH PROPOFOL;  Surgeon: [name], MD;  Location: [facility];  Service: Endoscopy;  Laterality: N/A;  12:00pm  . CORONARY ARTERY BYPASS GRAFT N/A 03/28/2016   Procedure: CORONARY ARTERY BYPASS GRAFTING (CABG) x 5 USING GREATER SAPHENOUS VEIN;  Surgeon: [name], MD;  Location: [facility] OR;  Service: Open Heart Surgery;  Laterality: N/A;  . ENDOVEIN HARVEST OF GREATER SAPHENOUS VEIN Right 03/28/2016   Procedure: ENDOVEIN HARVEST OF GREATER SAPHENOUS VEIN;  Surgeon: [name], MD;  Location: [facility];  Service: Open Heart Surgery;  Laterality: Right;  . ESOPHAGEAL DILATION N/A 05/07/2015   Procedure: ESOPHAGEAL DILATION WITH 56FR MALONEY DILATOR;  Surgeon: [name], MD;  Location: [facility];  Service: Endoscopy;  Laterality: N/A;  . ESOPHAGOGASTRODUODENOSCOPY (EGD) WITH PROPOFOL N/A 05/07/2015   RMR: Status post dilation of normal esophagus. Gastritis.  Marland Kitchen KNEE SURGERY Left    arthroscopy  . MANDIBLE FRACTURE SURGERY    . POLYPECTOMY  05/07/2015   Procedure: POLYPECTOMY (Hepatic Flexure, Distal Transverse Colon, Rectal);  Surgeon: [name], MD;  Location: [facility];  Service: Endoscopy;;  . TEE WITHOUT CARDIOVERSION N/A 03/28/2016   Procedure: TRANSESOPHAGEAL ECHOCARDIOGRAM (TEE);  Surgeon: [name], MD;  Location: [facility];  Service: Open Heart Surgery;  Laterality: N/A;  . TONSILLECTOMY         Family History  Problem Relation Age of Onset  . Diabetes Father   . Hypertension Father   . Arthritis Other   . Diabetes Other   . Asthma Other   . Stroke Sister     Social History    Tobacco Use  . Smoking status: Current Every Day Smoker    Packs/day: 0.50    Years: 30.00    Pack years: 15.00    Types: Cigarettes  . Smokeless tobacco: Never Used  . Tobacco comment: 3 cigs per day  Substance Use Topics  . Alcohol use: No  . Drug use: No    Home Medications Prior to Admission medications   Medication Sig Start Date End Date Taking? Authorizing Provider  Accu-Chek FastClix Lancets MISC USE TO TEST BLOOD SUGAR 4ETIMES A DAY. 04/11/19   [provider]  albuterol (PROVENTIL HFA;VENTOLIN HFA) 108 (90 Base) MCG/ACT inhaler Inhale 1-2 puffs into the lungs every 6 (six) hours as needed for wheezing or shortness of breath.    [provider]  amLODipine (NORVASC) 10 MG tablet Take 1 tablet (10 mg total) by mouth daily. Pt needs appt to continue to receive refills 11/15/18   Bronson Ing,  Lenise Herald, MD  atorvastatin (LIPITOR) 40 MG tablet Take 1 tablet (40 mg total) by mouth daily. 07/04/18 06/26/19  Herminio Commons, MD  Blood Glucose Monitoring Suppl (ACCU-CHEK GUIDE) w/Device KIT 1 Piece by Does not apply route as directed. 04/11/19   Cassandria Anger, MD  clopidogrel (PLAVIX) 75 MG tablet Take 75 mg by mouth daily.     [provider]  diazepam (VALIUM) 5 MG tablet Take 5 mg by mouth 2 (two) times daily as needed for anxiety. Can take 2 tablets at bedtime    [provider]  divalproex (DEPAKOTE) 500 MG DR tablet Take 3 tablets (1,500 mg total) by mouth 2 (two) times daily. 05/07/19   Penumalli, Earlean Polka, MD  escitalopram (LEXAPRO) 10 MG tablet Take 10 mg by mouth daily.    [provider]  GEODON 40 MG capsule Take 40 mg by mouth 2 (two) times daily. 06/12/19   [provider]  glucose blood (ACCU-CHEK GUIDE) test strip Use as instructed 04/11/19   Cassandria Anger, MD  HUMALOG 100 UNIT/ML injection SMARTSIG:2 Unit(s) SUB-Q 3 Times Daily 06/06/19   [provider]  ibuprofen (ADVIL) 800 MG tablet Take 1  tablet (800 mg total) by mouth every 6 (six) hours as needed. 07/01/19   [name], DPM  insulin aspart (NOVOLOG FLEXPEN) 100 UNIT/ML FlexPen Inject 5-8 Units into the skin 3 (three) times daily with meals.    [provider]  Insulin Glargine (LANTUS SOLOSTAR) 100 UNIT/ML Solostar Pen Inject 24 Units into the skin at bedtime. 06/21/19   Cassandria Anger, MD  ketorolac (TORADOL) 10 MG tablet Take 1 tablet (10 mg total) by mouth every 6 (six) hours as needed. 05/17/19   [name], DPM  lamoTRIgine (LAMICTAL) 25 MG tablet START WITH 1 TABLET DAILYATHEN INCREASE BY 1 TABLET EVERY OTHER WEEK TO GOAL OF 2 TABLETS TWICE DAILY. 05/20/19   [provider]  lisinopril (ZESTRIL) 20 MG tablet Take 1 tablet (20 mg total) by mouth daily. 06/26/19 09/24/19  Herminio Commons, MD  Melatonin 10 MG CAPS Take 1 capsule by mouth at bedtime. 06/12/19   [provider]  metoprolol tartrate (LOPRESSOR) 25 MG tablet Take 0.5 tablets (12.5 mg total) by mouth 2 (two) times daily. 04/23/18 06/26/19  Imogene Burn, PA-C  nitroGLYCERIN (NITROSTAT) 0.4 MG SL tablet Place 1 tablet (0.4 mg total) under the tongue every 5 (five) minutes as needed for chest pain. 02/20/19 06/26/19  Strader, Fransisco Hertz, PA-C  RANEXA 500 MG 12 hr tablet TAKE (1) TABLET BY MOUTH TWICE DAILY. 08/07/18   Herminio Commons, MD  risperiDONE (RISPERDAL) 2 MG tablet Take 2 mg by mouth at bedtime.    [provider]  paliperidone (INVEGA) 6 MG 24 hr tablet Take 12 mg by mouth daily.  02/12/19  [provider]    Allergies    Aspirin  Review of Systems   Review of Systems  Genitourinary: Positive for flank pain.   All other systems reviewed and are negative except that which was mentioned in HPI  Physical Exam Updated Vital Signs BP (!) 169/109 (BP Location: Right Arm)   Pulse 60   Temp 98.6 F (37 C) (Oral)   Resp 16   SpO2 100%   Physical Exam Vitals and nursing note reviewed.    Constitutional:      General: He is not in acute distress.    Appearance: He is well-developed.  Comments: Sitting up, leaning over, chronically ill-appearing but no acute distress  HENT:     Head: Normocephalic and atraumatic.  Eyes:     Conjunctiva/sclera: Conjunctivae normal.  Cardiovascular:     Rate and Rhythm: Normal rate and regular rhythm.     Heart sounds: Normal heart sounds. No murmur.  Pulmonary:     Effort: Pulmonary effort is normal.     Breath sounds: Normal breath sounds.  Abdominal:     General: Bowel sounds are normal. There is no distension.     Palpations: Abdomen is soft.     Tenderness: There is no abdominal tenderness.  Musculoskeletal:     Cervical back: Neck supple.     Comments: Tenderness on right lower thoracic back; left lower leg in postop boot  Skin:    General: Skin is warm and dry.  Neurological:     Mental Status: He is alert and oriented to person, place, and time.     Comments: Fluent speech  Psychiatric:        Judgment: Judgment normal.     ED Results / Procedures / Treatments   Labs (all labs ordered are listed, but only abnormal results are displayed) Labs Reviewed  URINALYSIS, ROUTINE W REFLEX MICROSCOPIC - Abnormal; Notable for the following components:      Result Value   Hgb urine dipstick SMALL (*)    Protein, ur 100 (*)    Bacteria, UA RARE (*)    All other components within normal limits  BASIC METABOLIC PANEL - Abnormal; Notable for the following components:   Glucose, Bld 135 (*)    Creatinine, Ser 1.26 (*)    Calcium 8.8 (*)    All other components within normal limits  CBC - Abnormal; Notable for the following components:   RBC 3.88 (*)    Hemoglobin 12.3 (*)    HCT 34.6 (*)    All other components within normal limits  RAPID URINE DRUG SCREEN, HOSP PERFORMED - Abnormal; Notable for the following components:   Opiates POSITIVE (*)    Cocaine POSITIVE (*)    Tetrahydrocannabinol POSITIVE (*)    All other  components within normal limits  HEPATIC FUNCTION PANEL - Abnormal; Notable for the following components:   Total Protein 5.4 (*)    Albumin 2.7 (*)    AST 11 (*)    All other components within normal limits  LIPASE, BLOOD    EKG EKG Interpretation  Date/Time:  Wednesday July 17 2019 13:22:20 EST Ventricular Rate:  54 PR Interval:    QRS Duration: 91 QT Interval:  444 QTC Calculation: 421 R Axis:   -55 Text Interpretation: Sinus rhythm Left anterior fascicular block Borderline ST elevation, anterior leads No significant change since last tracing Confirmed by Theotis Burrow [phone]) on 07/17/2019 1:29:21 PM   Radiology CT Renal Stone Study  Result Date: 07/17/2019 CLINICAL DATA:  Right flank pain and dysuria EXAM: CT ABDOMEN AND PELVIS WITHOUT CONTRAST TECHNIQUE: Multidetector CT imaging of the abdomen and pelvis was performed following the standard protocol without oral or IV contrast. COMPARISON:  June 29, 2014. FINDINGS: Lower chest: Lung bases are clear. There are occasional foci of coronary artery calcification. Hepatobiliary: No focal liver lesions are evident on this noncontrast enhanced study. Gallbladder wall is not appreciably thickened. There is no biliary duct dilatation. Pancreas: There is no pancreatic mass or inflammatory focus. Spleen: No splenic lesions are evident. Adrenals/Urinary Tract: Adrenals bilaterally appear normal. There is no evident renal mass or  hydronephrosis on either side. There is slight perinephric stranding on each side. There is no appreciable renal or ureteral calculus on either side. Urinary bladder is midline with wall thickness within normal limits. Stomach/Bowel: There are scattered descending colonic and sigmoid diverticula without diverticulitis. There is no appreciable bowel wall or mesenteric thickening. There is moderate stool in the colon. There is no evident bowel obstruction. The terminal ileum appears unremarkable. There is no free  air or portal venous air. Vascular/Lymphatic: There is no abdominal aortic aneurysm. There is aortic and iliac artery atherosclerotic calcification. There is no evident adenopathy in the abdomen or pelvis. Reproductive: Prostate and seminal vesicles are normal in size and contour. There are a few tiny prostatic calculi. There is no evident pelvic mass. Other: Appendix appears normal. There is no abscess or ascites in the abdomen or pelvis. Musculoskeletal: There are no blastic or lytic bone lesions. There is no intramuscular or abdominal wall lesion. IMPRESSION: 1. Slight soft tissue stranding in the perinephric fascia bilaterally. Significance of this finding is uncertain. No associated fluid or abscess involving either kidney or perinephric region. Earliest changes of pyelonephritis could present in this manner; correlation with urinalysis in this regard advised. 2. No hydronephrosis on either side. No renal or ureteral calculi. Urinary bladder wall thickness normal. There are a few tiny prostatic calculi. 3. Left-sided colonic diverticula without diverticulitis. No bowel obstruction. No abscess in the abdomen or pelvis. Appendix appears normal. 4. Aortic Atherosclerosis (ICD10-I70.0). There also foci of coronary artery calcification and iliac artery calcification. Electronically Signed   By: Lowella Grip III M.D.   On: 07/17/2019 14:03    Procedures Procedures (including critical care time)  Medications Ordered in ED Medications  cloNIDine (CATAPRES) tablet 0.1 mg (0.1 mg Oral Given 07/17/19 1314)  HYDROcodone-acetaminophen (NORCO/VICODIN) 5-325 MG per tablet 1 tablet (1 tablet Oral Given 07/17/19 1435)  ketorolac (TORADOL) 30 MG/ML injection 15 mg (15 mg Intramuscular Given 07/17/19 1435)    ED Course  I have reviewed the triage vital signs and the nursing notes.  Pertinent labs & imaging results that were available during my care of the patient were reviewed by me and considered in my  medical decision making (see chart for details).    MDM Rules/Calculators/A&P                      Patient nontoxic on exam, severely hypertensive.  No chest pain or confusion to suggest hypertensive emergency.  Although he denies drug use, UDS is positive for opiates, cocaine, and THC and patient smells heavily of THC.  His elevated blood pressure may be in part due to his drug use.  Lab work overall is unremarkable with stable creatinine from previous.  UA with small amount of blood but no evidence of infection.  Obtain CT renal study which was negative for stone or hydronephrosis.  He had some mild soft tissue stranding and perinephric fascia bilaterally of unclear significance.  Given that he has had no infectious symptoms and UA does not suggest infection, I do not feel he needs any antibiotics at this time.  I have discussed supportive measures for pain control, reviewed return precautions. Final Clinical Impression(s) / ED Diagnoses Final diagnoses:  Right flank pain    Rx / DC Orders ED Discharge Orders    None       Zakyah Yanes, Wenda Overland, MD 07/17/19 [phone]

## 2019-07-17 NOTE — ED Triage Notes (Signed)
Pt to ER for evaluation of right flank pain onset 4-5 days ago, reports pain with urination. Denies nausea, vomiting, diarrhea. Reports pain with deep inspiration. Denies injury to back, but pain is reproducible with movement. A/o x4. Hypertensive 200/105, reports has PCP and has been taking medications.

## 2019-07-18 ENCOUNTER — Other Ambulatory Visit: Payer: Self-pay

## 2019-07-18 ENCOUNTER — Encounter (HOSPITAL_COMMUNITY): Payer: Self-pay | Admitting: *Deleted

## 2019-07-18 ENCOUNTER — Emergency Department (HOSPITAL_COMMUNITY)
Admission: EM | Admit: 2019-07-18 | Discharge: 2019-07-18 | Disposition: A | Discharge status: Home / Self Care | Payer: Medicaid Other | Attending: Emergency Medicine | Admitting: Emergency Medicine

## 2019-07-18 DIAGNOSIS — M549 Dorsalgia, unspecified: Secondary | ICD-10-CM | POA: Insufficient documentation

## 2019-07-18 DIAGNOSIS — Z5321 Procedure and treatment not carried out due to patient leaving prior to being seen by health care provider: Secondary | ICD-10-CM | POA: Diagnosis not present

## 2019-07-18 NOTE — ED Triage Notes (Signed)
Low back pain . 

## 2019-07-19 ENCOUNTER — Other Ambulatory Visit: Payer: Self-pay | Admitting: Podiatry

## 2019-07-19 MED ORDER — OXYCODONE-ACETAMINOPHEN 10-325 MG PO TABS: 1.0000 | ORAL_TABLET | Freq: Three times a day (TID) | ORAL | 0 refills | Status: AC | PRN

## 2019-07-19 NOTE — Addendum Note (Signed)
Addended by: Boneta Lucks on: 07/19/2019 04:58 PM   Modules accepted: Orders

## 2019-07-19 NOTE — Telephone Encounter (Signed)
Patient had surgery on 07/01/19 and states he is still having severe pain and would like to have a refill of his pain medication sent in.

## 2019-07-19 NOTE — Telephone Encounter (Signed)
Please advise. Thanks Melvin Taylor 

## 2019-07-19 NOTE — Telephone Encounter (Signed)
I will refill the medication.

## 2019-07-22 NOTE — Telephone Encounter (Signed)
I spoke with pt and asked about his pain status. Pt states he has a lot of pain first thing in the morning, and when rising from rest, a shooting pain and tenderness in the middle of the left foot.

## 2019-07-31 ENCOUNTER — Telehealth: Payer: Self-pay

## 2019-07-31 ENCOUNTER — Telehealth: Payer: Self-pay | Admitting: Podiatry

## 2019-07-31 ENCOUNTER — Encounter: Payer: Medicaid Other | Admitting: Podiatry

## 2019-07-31 MED ORDER — OXYCODONE-ACETAMINOPHEN 10-325 MG PO TABS: 1.0000 | ORAL_TABLET | Freq: Three times a day (TID) | ORAL | 0 refills | Status: AC | PRN

## 2019-07-31 NOTE — Telephone Encounter (Signed)
Pt called requesting a call back from the nurse. He states he is still having a lot of pain from his surgery and had to cancel his appt today due to lack of transportation. Pt is out of his pain medication as well and would like to know what he can do to help with the pain.

## 2019-07-31 NOTE — Telephone Encounter (Signed)
I will send his pain med to pharmacy. thanks

## 2019-07-31 NOTE — Telephone Encounter (Signed)
Spoke to patient. He missed his last appointment due to transportation issues with [facility]. He is rescheduled for next Wednesday. He is requesting a refill of pain medicine. Please send to [facility] in [place] [employer] [place],  [place]

## 2019-08-07 ENCOUNTER — Ambulatory Visit (INDEPENDENT_AMBULATORY_CARE_PROVIDER_SITE_OTHER): Payer: Medicaid Other | Admitting: Podiatry

## 2019-08-07 ENCOUNTER — Other Ambulatory Visit: Payer: Self-pay

## 2019-08-07 DIAGNOSIS — Z9889 Other specified postprocedural states: Secondary | ICD-10-CM

## 2019-08-07 DIAGNOSIS — M722 Plantar fascial fibromatosis: Secondary | ICD-10-CM

## 2019-08-07 DIAGNOSIS — M79672 Pain in left foot: Secondary | ICD-10-CM

## 2019-08-07 MED ORDER — OXYCODONE-ACETAMINOPHEN 10-325 MG PO TABS: 1.0000 | ORAL_TABLET | Freq: Three times a day (TID) | ORAL | 0 refills | Status: AC | PRN

## 2019-08-08 ENCOUNTER — Encounter: Payer: Self-pay | Admitting: Podiatry

## 2019-08-08 NOTE — Progress Notes (Signed)
Subjective:  Patient ID: Melvin Taylor, male    DOB: 27-Jun-1966,  MRN: [id number]  Chief Complaint  Patient presents with  . Routine Post Op     POV#3 DOS 07/01/2019 EPF LT     54 y.o. male returns for post-op check.  Patient states that he is doing a lot better.  His pain has been well controlled.  He states that he is now out of her pain medication he would like to little bit more pain Medication just in case if he has any future pain.  However overall he is doing much better.  He is ambulating in regular sneakers now.  No clinical signs of infection.  No other acute complaints.  Review of Systems: Negative except as noted in the HPI. Denies N/V/F/Ch.  Past Medical History:  Diagnosis Date  . Anxiety   . Bipolar 1 disorder ([place])   . Bone spur    left heel  . Cervical radiculopathy   . Chest pain   . Chronic back pain   . Chronic chest wall pain   . Chronic neck pain   . Coronary artery disease    a. s/p CABG in 03/2016 with LIMA-LAD, SVG-D1, SVG-RCA, and Seq SVG-mid and distal OM  . Depression   . Diabetes mellitus    Type II  . Hallucinations    "long history of them"  . Headache(784.0)   . History of gout   . HTN (hypertension)   . Insomnia   . Myocardial infarction ([place]) 2017  . Neuropathy   . Pain management   . Polysubstance abuse ([place])   . Right leg pain    chronic  . Schizophrenia ([place])   . Seizures ([place])    last sz between July 5-9th, 2016; epilepsy  . Shortness of breath dyspnea    with exertion  . Sleep apnea   . Transfusion of blood product refused for religious reason     Current Outpatient Medications:  .  Accu-Chek FastClix Lancets MISC, USE TO TEST BLOOD SUGAR 4ETIMES A DAY., Disp: , Rfl:  .  albuterol (PROVENTIL HFA;VENTOLIN HFA) 108 (90 Base) MCG/ACT inhaler, Inhale 1-2 puffs into the lungs every 6 (six) hours as needed for wheezing or shortness of breath., Disp: , Rfl:  .  amLODipine (NORVASC) 10 MG tablet, Take 1 tablet (10 mg total) by  mouth daily. Pt needs appt to continue to receive refills, Disp: 90 tablet, Rfl: 1 .  baclofen (LIORESAL) 10 MG tablet, Take 10 mg by mouth 2 (two) times daily., Disp: , Rfl:  .  Blood Glucose Monitoring Suppl (ACCU-CHEK GUIDE) w/Device KIT, 1 Piece by Does not apply route as directed., Disp: 1 kit, Rfl: 0 .  clopidogrel (PLAVIX) 75 MG tablet, Take 75 mg by mouth daily. , Disp: , Rfl:  .  diazepam (VALIUM) 5 MG tablet, Take 5 mg by mouth 2 (two) times daily as needed for anxiety. Can take 2 tablets at bedtime, Disp: , Rfl:  .  divalproex (DEPAKOTE) 500 MG DR tablet, Take 3 tablets (1,500 mg total) by mouth 2 (two) times daily., Disp: 540 tablet, Rfl: 4 .  escitalopram (LEXAPRO) 10 MG tablet, Take 10 mg by mouth daily., Disp: , Rfl:  .  GEODON 40 MG capsule, Take 40 mg by mouth 2 (two) times daily., Disp: , Rfl:  .  glucose blood (ACCU-CHEK GUIDE) test strip, Use as instructed, Disp: 150 each, Rfl: 2 .  HUMALOG 100 UNIT/ML injection, SMARTSIG:2 Unit(s) SUB-Q 3 Times  Daily, Disp: , Rfl:  .  hydrALAZINE (APRESOLINE) 50 MG tablet, Take 50 mg by mouth 2 (two) times daily., Disp: , Rfl:  .  ibuprofen (ADVIL) 800 MG tablet, Take 1 tablet (800 mg total) by mouth every 6 (six) hours as needed., Disp: 60 tablet, Rfl: 1 .  insulin aspart (NOVOLOG FLEXPEN) 100 UNIT/ML FlexPen, Inject 5-8 Units into the skin 3 (three) times daily with meals., Disp: , Rfl:  .  Insulin Glargine (LANTUS SOLOSTAR) 100 UNIT/ML Solostar Pen, Inject 24 Units into the skin at bedtime., Disp: 5 pen, Rfl: 3 .  ketorolac (TORADOL) 10 MG tablet, Take 1 tablet (10 mg total) by mouth every 6 (six) hours as needed., Disp: 20 tablet, Rfl: 0 .  lamoTRIgine (LAMICTAL) 25 MG tablet, START WITH 1 TABLET DAILYATHEN INCREASE BY 1 TABLET EVERY OTHER WEEK TO GOAL OF 2 TABLETS TWICE DAILY., Disp: , Rfl:  .  lisinopril (ZESTRIL) 20 MG tablet, Take 1 tablet (20 mg total) by mouth daily., Disp: 90 tablet, Rfl: 3 .  Melatonin 10 MG CAPS, Take 1 capsule by  mouth at bedtime., Disp: , Rfl:  .  RANEXA 500 MG 12 hr tablet, TAKE (1) TABLET BY MOUTH TWICE DAILY., Disp: 60 tablet, Rfl: 11 .  risperiDONE (RISPERDAL) 2 MG tablet, Take 2 mg by mouth at bedtime., Disp: , Rfl:  .  atorvastatin (LIPITOR) 40 MG tablet, Take 1 tablet (40 mg total) by mouth daily., Disp: 90 tablet, Rfl: 3 .  metoprolol tartrate (LOPRESSOR) 25 MG tablet, Take 0.5 tablets (12.5 mg total) by mouth 2 (two) times daily., Disp: 90 tablet, Rfl: 3 .  nitroGLYCERIN (NITROSTAT) 0.4 MG SL tablet, Place 1 tablet (0.4 mg total) under the tongue every 5 (five) minutes as needed for chest pain., Disp: 25 tablet, Rfl: 3 .  oxyCODONE-acetaminophen (PERCOCET) 10-325 MG tablet, Take 1 tablet by mouth every 8 (eight) hours as needed for up to 5 days for pain., Disp: 15 tablet, Rfl: 0  Social History   Tobacco Use  Smoking Status Current Every Day Smoker  . Packs/day: 0.50  . Years: 30.00  . Pack years: 15.00  . Types: Cigarettes  Smokeless Tobacco Never Used  Tobacco Comment   3 cigs per day    Allergies  Allergen Reactions  . Aspirin Swelling and Other (See Comments)    Facial swelling    Objective:  There were no vitals filed for this visit. There is no height or weight on file to calculate BMI. Constitutional Well developed. Well nourished.  Vascular Foot warm and well perfused. Capillary refill normal to all digits.   Neurologic Normal speech. Oriented to person, place, and time. Epicritic sensation to light touch grossly present bilaterally.  Dermatologic  skin incision has completely reepithelialized.  No acute signs of infection.  Orthopedic:  No tenderness to the palpation noted at the surgical site.   Radiographs: None Assessment:   1. Plantar fasciitis   2. Left foot pain   3. Status post foot surgery    Plan:  Patient was evaluated and treated and all questions answered.  S/p foot surgery left -Progressing as expected post-operatively. -XR: None -WB Status:  Patient is transition to regular sneakers. -Sutures: Incision site has completely reepithelialized. -Medications: None -Foot redressed. -I explained to the patient that this will be the last postop visit.  Given that patient has been doing much better.  Educated him to keep doing his stretching exercises and wear good supportive sneakers.  Patient states understanding.  Patient is officially discharged from my care.  I instructed the patient if there is any other foot and ankle issues arises in the future come back and see me right away.  Return if symptoms worsen or fail to improve.

## 2019-08-16 ENCOUNTER — Other Ambulatory Visit: Payer: Self-pay | Admitting: Cardiovascular Disease

## 2019-08-20 IMAGING — DX DG CHEST 2V
2 series · 2 of 2 positions shown · non-contrast
Comparison: Radiographs September 11, 2017.

CLINICAL DATA: Chest pain.

EXAM:
CHEST - 2 VIEW

[chest pa]
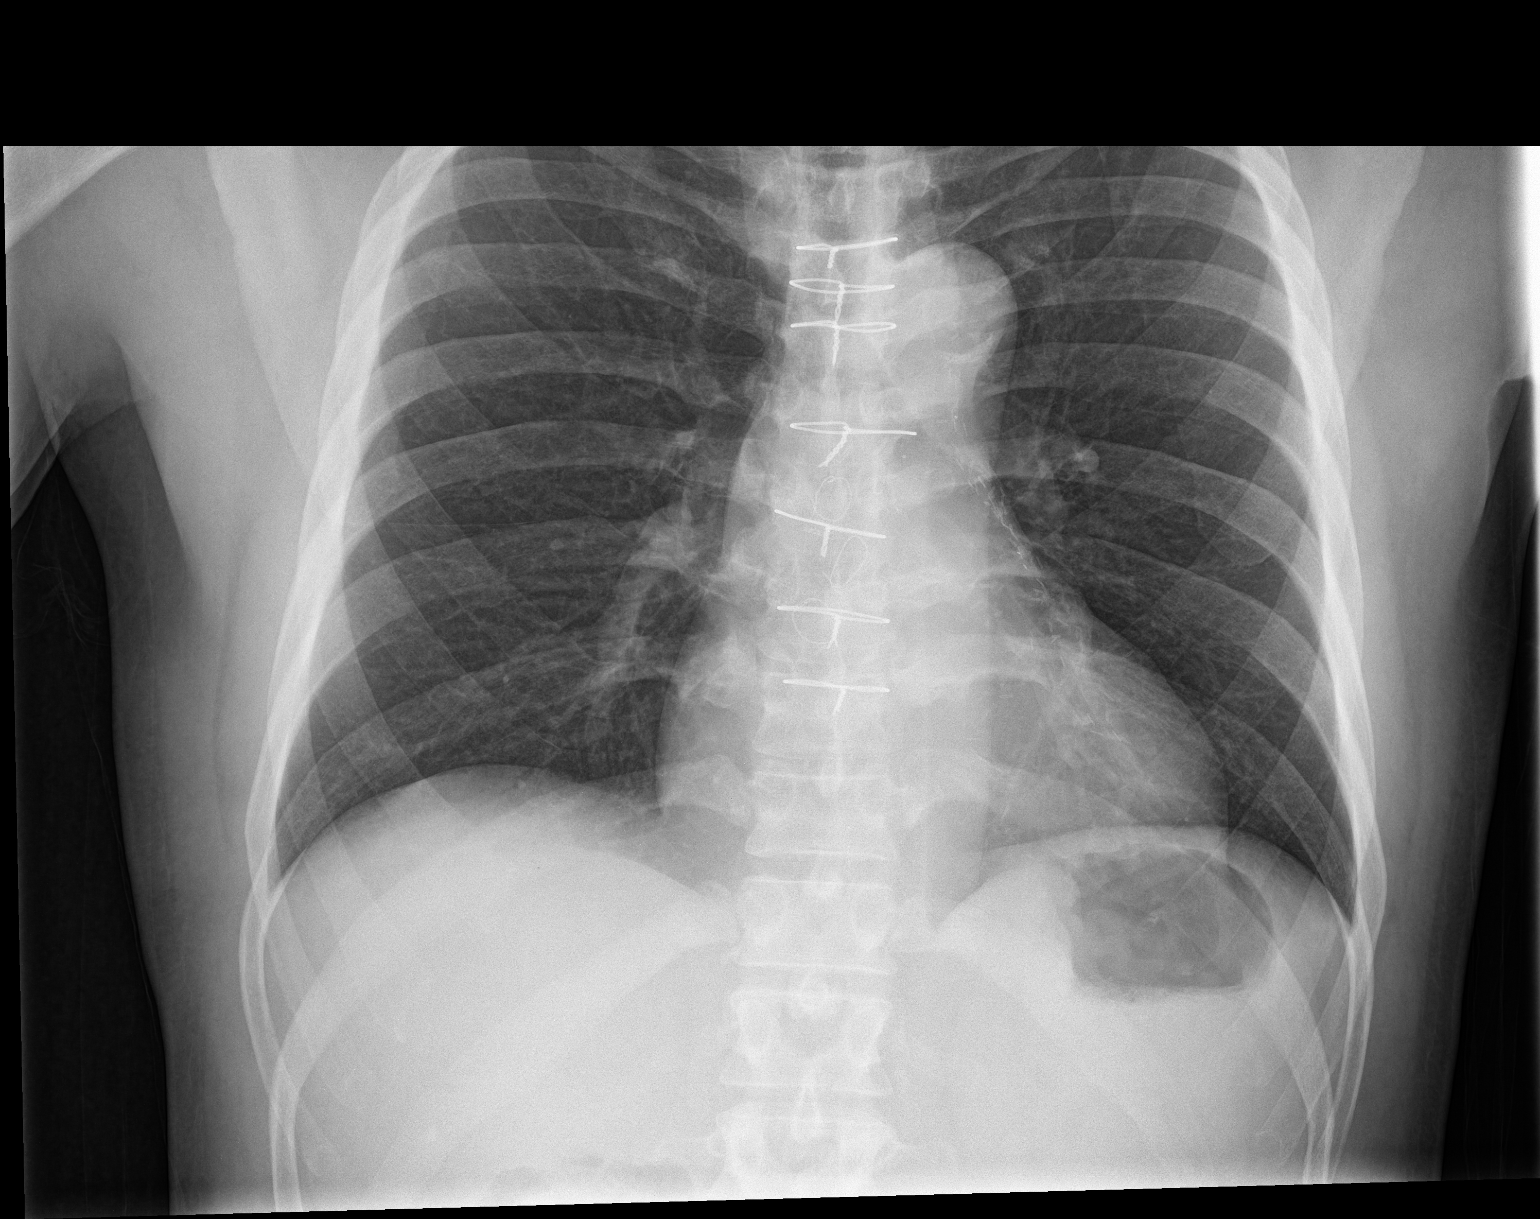

[chest lat]
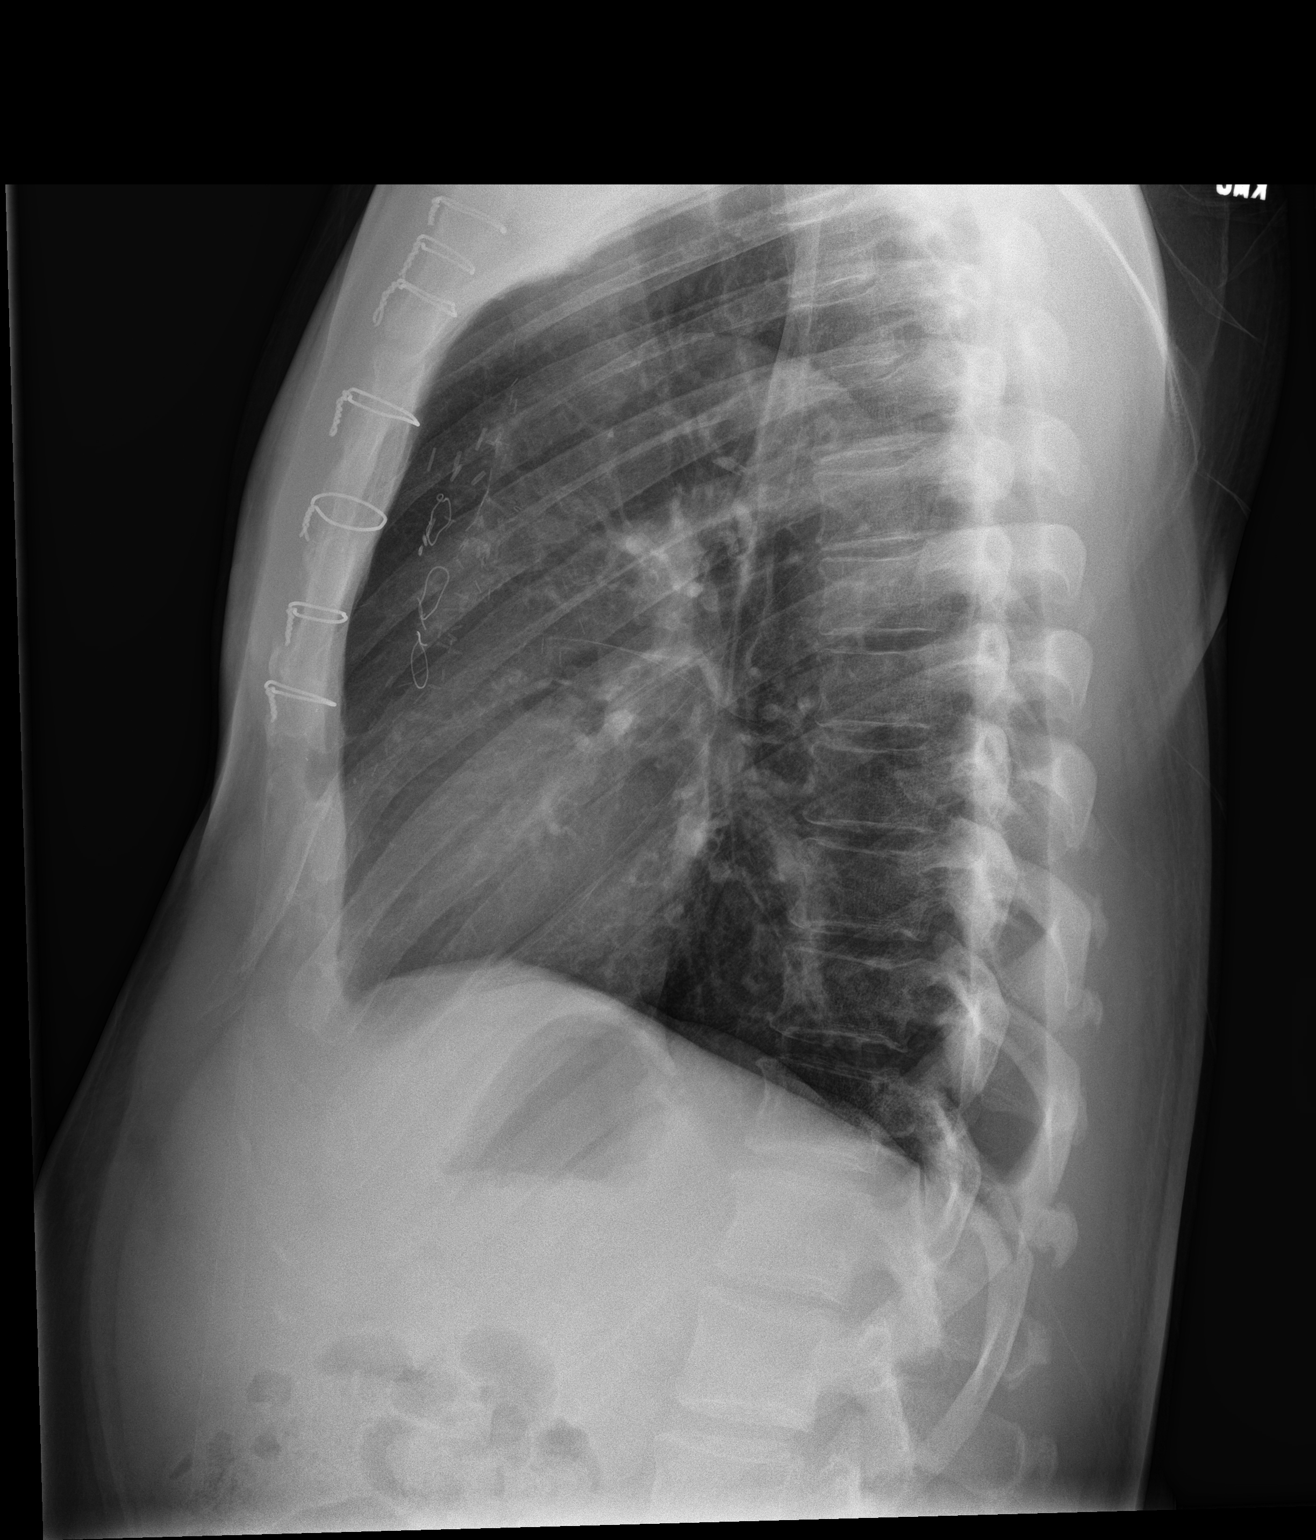

[2 of 2 positions shown; findings below may reference images not displayed]

FINDINGS: The heart size and mediastinal contours are within normal limits.
Both lungs are clear. No pneumothorax or pleural effusion is noted.
Status post coronary artery bypass graft. The visualized skeletal
structures are unremarkable.
IMPRESSION: No active cardiopulmonary disease.

## 2019-08-27 ENCOUNTER — Emergency Department (HOSPITAL_COMMUNITY)
Admission: EM | Admit: 2019-08-27 | Discharge: 2019-08-27 | Discharge status: Against Medical Advice | Payer: Medicaid Other | Attending: Emergency Medicine | Admitting: Emergency Medicine

## 2019-08-27 ENCOUNTER — Encounter (HOSPITAL_COMMUNITY): Payer: Self-pay

## 2019-08-27 ENCOUNTER — Other Ambulatory Visit: Payer: Self-pay

## 2019-08-27 ENCOUNTER — Emergency Department (HOSPITAL_COMMUNITY): Payer: Medicaid Other

## 2019-08-27 DIAGNOSIS — Z794 Long term (current) use of insulin: Secondary | ICD-10-CM | POA: Diagnosis not present

## 2019-08-27 DIAGNOSIS — E119 Type 2 diabetes mellitus without complications: Secondary | ICD-10-CM | POA: Insufficient documentation

## 2019-08-27 DIAGNOSIS — Z7902 Long term (current) use of antithrombotics/antiplatelets: Secondary | ICD-10-CM | POA: Insufficient documentation

## 2019-08-27 DIAGNOSIS — F17210 Nicotine dependence, cigarettes, uncomplicated: Secondary | ICD-10-CM | POA: Diagnosis not present

## 2019-08-27 DIAGNOSIS — R778 Other specified abnormalities of plasma proteins: Secondary | ICD-10-CM

## 2019-08-27 DIAGNOSIS — Z951 Presence of aortocoronary bypass graft: Secondary | ICD-10-CM | POA: Diagnosis not present

## 2019-08-27 DIAGNOSIS — I10 Essential (primary) hypertension: Secondary | ICD-10-CM | POA: Diagnosis not present

## 2019-08-27 DIAGNOSIS — R072 Precordial pain: Secondary | ICD-10-CM | POA: Diagnosis not present

## 2019-08-27 DIAGNOSIS — R748 Abnormal levels of other serum enzymes: Secondary | ICD-10-CM | POA: Diagnosis not present

## 2019-08-27 DIAGNOSIS — I252 Old myocardial infarction: Secondary | ICD-10-CM | POA: Insufficient documentation

## 2019-08-27 DIAGNOSIS — I2510 Atherosclerotic heart disease of native coronary artery without angina pectoris: Secondary | ICD-10-CM | POA: Diagnosis not present

## 2019-08-27 DIAGNOSIS — R0789 Other chest pain: Secondary | ICD-10-CM | POA: Diagnosis present

## 2019-08-27 LAB — TROPONIN I (HIGH SENSITIVITY)
Troponin I (High Sensitivity): 6 ng/L (ref <18)
Troponin I (High Sensitivity): 45 ng/L — ABNORMAL HIGH (ref <18)

## 2019-08-27 LAB — CBC
HCT: 40.6 % (ref 39.0–52.0)
Hemoglobin: 14.1 g/dL (ref 13.0–17.0)
MCH: 31 pg (ref 26.0–34.0)
MCHC: 34.7 g/dL (ref 30.0–36.0)
MCV: 89.2 fL (ref 80.0–100.0)
Platelets: 180 10*3/uL (ref 150–400)
RBC: 4.55 MIL/uL (ref 4.22–5.81)
RDW: 12.5 % (ref 11.5–15.5)
WBC: 7 10*3/uL (ref 4.0–10.5)
nRBC: 0 % (ref 0.0–0.2)

## 2019-08-27 LAB — COMPREHENSIVE METABOLIC PANEL
ALT: 15 U/L (ref 0–44)
AST: 20 U/L (ref 15–41)
Albumin: 3.3 g/dL — ABNORMAL LOW (ref 3.5–5.0)
Alkaline Phosphatase: 45 U/L (ref 38–126)
Anion gap: 5 (ref 5–15)
BUN: 19 mg/dL (ref 6–20)
CO2: 26 mmol/L (ref 22–32)
Calcium: 8.4 mg/dL — ABNORMAL LOW (ref 8.9–10.3)
Chloride: 105 mmol/L (ref 98–111)
Creatinine, Ser: 1.41 mg/dL — ABNORMAL HIGH (ref 0.61–1.24)
GFR calc Af Amer: >60 mL/min (ref >60)
GFR calc non Af Amer: 56 mL/min — ABNORMAL LOW (ref >60)
Glucose, Bld: 138 mg/dL — ABNORMAL HIGH (ref 70–99)
Potassium: 3.7 mmol/L (ref 3.5–5.1)
Sodium: 136 mmol/L (ref 135–145)
Total Bilirubin: 0.6 mg/dL (ref 0.3–1.2)
Total Protein: 6 g/dL — ABNORMAL LOW (ref 6.5–8.1)

## 2019-08-27 LAB — BRAIN NATRIURETIC PEPTIDE: B Natriuretic Peptide: 40 pg/mL (ref 0.0–100.0)

## 2019-08-27 MED ORDER — ACETAMINOPHEN 325 MG PO TABS
650.0000 mg | ORAL_TABLET | Freq: Once | ORAL | Status: DC
  Filled 2019-08-27: qty 2

## 2019-08-27 MED ORDER — CLOPIDOGREL BISULFATE 75 MG PO TABS
75.0000 mg | ORAL_TABLET | Freq: Once | ORAL | Status: AC
  Administered 2019-08-27: 75 mg via ORAL
  Filled 2019-08-27: qty 1

## 2019-08-27 NOTE — ED Notes (Signed)
Patient wanting to leave. Patient advised that his second troponin was elevated and that he met criteria for admission. Dr. Rogene Houston notified and came in to talk to patient. Patient refused to stay and be admitted. RN encouraged patient to stay and informed patient of consequences for leaving AMA along with Dr. Rogene Houston. Patient signed AMA form and was told to seek medical treatment per EMS [facility] or [facility] in the event that he changed his mind or if chest pain returned or patient's health declined.

## 2019-08-27 NOTE — Discharge Instructions (Addendum)
As we discussed you meet criteria for admission by standard of care.  Could be very dangerous to go home with these troponins going up.  Could result in death. It is very concerning for the fact that you may have had a heart attack with the chest pain you had today.  You can return at any time here or follow-up with cardiology or follow-up at [facility].  Definitely return for any new or worse pain.

## 2019-08-27 NOTE — ED Provider Notes (Signed)
[facility] EMERGENCY DEPARTMENT Provider Note   CSN: [id number] Arrival date & time: 08/27/19  1659     History Chief Complaint  Patient presents with  . Chest Pain    Melvin Taylor is a 54 y.o. male.  Patient presented concerning that he is had a heart attack.  He developed left-sided chest pain that was sharp and stabbing at 430 this afternoon.  Patient states that he was pain-free by 6 PM.  Patient has a history of anxiety bipolar disease.  Followed by Dr. Raliegh Ip from cardiology.  He was last seen by them November 25.  Past medical history is significant for known coronary artery disease he underwent a 5 vessel CABG in August 2017.  They make comment that since the bypass surgery he has struggled with musculoskeletal chest pain and some left arm pain.  He has had numerous visits ED visits for the chest pain.  Recently had a Lexiscan Myoview done in July 2020 demonstrated evidence of the prior inferior MI no evidence of recurrent ischemia.  He also had an echocardiogram done August 3 that demonstrated normal LV systolic function.  They felt at that time he was doing well from a cardiac standpoint.  Patient normally takes Plavix is not able to take aspirin he did not have his plastic today so we will give him some Plavix.  Will do chest pain work-up will probably need serial troponins.  Patient states that the pain is not worse this time with movement.  The pain was quite severe at its onset almost brought him to his knees.  Denies any nausea or vomiting or any shortness of breath.  Denies any upper respiratory symptoms or any fevers.        Past Medical History:  Diagnosis Date  . Anxiety   . Bipolar 1 disorder ([place])   . Bone spur    left heel  . Cervical radiculopathy   . Chest pain   . Chronic back pain   . Chronic chest wall pain   . Chronic neck pain   . Coronary artery disease    a. s/p CABG in 03/2016 with LIMA-LAD, SVG-D1, SVG-RCA, and Seq SVG-mid and distal OM  .  Depression   . Diabetes mellitus    Type II  . Hallucinations    "long history of them"  . Headache(784.0)   . History of gout   . HTN (hypertension)   . Insomnia   . Myocardial infarction ([place]) 2017  . Neuropathy   . Pain management   . Polysubstance abuse ([place])   . Right leg pain    chronic  . Schizophrenia ([place])   . Seizures ([place])    last sz between July 5-9th, 2016; epilepsy  . Shortness of breath dyspnea    with exertion  . Sleep apnea   . Transfusion of blood product refused for religious reason     Patient Active Problem List   Diagnosis Date Noted  . Current smoker 06/21/2019  . Seizure-like activity ([place]) 05/06/2019  . Type 2 diabetes mellitus with stage 3 chronic kidney disease, with long-term current use of insulin ([place]) 04/11/2019  . Anemia, chronic disease 07/04/2018  . Other specified diseases of the digestive system 10/30/2017  . CAD (coronary artery disease) 03/28/2016  . Chest pain 03/21/2016  . Mixed hyperlipidemia 03/11/2016  . Ischemic chest pain ([place])   . NSTEMI (non-ST elevated myocardial infarction) ([place])   . Coronary atherosclerosis of native coronary artery 03/03/2016  . Cervical  spinal stenosis 12/14/2015  . Loss of weight 08/25/2015  . Essential hypertension, benign 07/23/2015  . Personal history of noncompliance with medical treatment, presenting hazards to health 07/23/2015  . Abdominal pain 06/08/2015  . Constipation 06/08/2015  . History of colonic polyps   . Diverticulosis of colon without hemorrhage   . Mucosal abnormality of stomach   . Encounter for screening colonoscopy 04/16/2015  . Dysphagia 04/16/2015  . Partial symptomatic epilepsy with complex partial seizures, intractable, without status epilepticus ([place]) 11/10/2014  . Bipolar disorder, unspecified ([place]) 10/15/2014  . Schizophrenia ([place]) 10/15/2014  . DM type 2 causing vascular disease ([place]) 06/29/2014  . Musculoskeletal pain 06/29/2014  . Hyperglycemia without ketosis  09/07/2013  . 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 Other internal derangements of unspecified knee 01/13/2011  . Knee pain 12/28/2010  . Medial meniscus, posterior horn derangement 12/28/2010    Past Surgical History:  Procedure Laterality Date  . ANTERIOR CERVICAL DECOMP/DISCECTOMY FUSION N/A 12/14/2015   Procedure: ANTERIOR CERVICAL DECOMPRESSION/DISCECTOMY FUSION CERVICAL FIVE -SIX;  Surgeon: Earnie Larsson, MD;  Location: [place] NEURO ORS;  Service: Neurosurgery;  Laterality: N/A;  . BIOPSY  05/07/2015   Procedure: BIOPSY (Gastric);  Surgeon: [name], MD;  Location: [facility] ORS;  Service: Endoscopy;;  . CARDIAC CATHETERIZATION N/A 03/07/2016   Procedure: Left Heart Cath and Coronary Angiography;  Surgeon: Belva Crome, MD;  Location: [place] CV LAB;  Service: Cardiovascular;  Laterality: N/A;  . COLONOSCOPY WITH PROPOFOL N/A 05/07/2015   TIW:[id number] diverticulosis, multiple colon polyps removed, tubular adenoma, serrated colon polyp. Next colonoscopy October 2019  . COLONOSCOPY WITH PROPOFOL N/A 08/02/2018   Procedure: COLONOSCOPY WITH PROPOFOL;  Surgeon: [name], MD;  Location: [facility] ENDO SU[facility];  Service: Endoscopy;  Laterality: N/A;  12:00pm  . CORONARY ARTERY BYPASS GRAFT N/A 03/28/2016   Procedure: CORONARY ARTERY BYPASS GRAFTING (CABG) x 5 USING GREATER SAPHENOUS VEIN;  Surgeon: [name], MD;  Location: [place] OR;  Service: Open Heart Surgery;  Laterality: N/A;  . ENDOVEIN HARVEST OF GREATER SAPHENOUS VEIN Right 03/28/2016   Procedure: ENDOVEIN HARVEST OF GREATER SAPHENOUS VEIN;  Surgeon: [name], MD;  Location: [place];  Service: Open Heart Surgery;  Laterality: Right;  . ESOPHAGEAL DILATION N/A 05/07/2015   Procedure: ESOPHAGEAL DILATION WITH 56FR MALONEY DILATOR;  Surgeon: [name], MD;  Location: [facility];  Service: Endoscopy;  Laterality: N/A;  . ESOPHAGOGASTRODUODENOSCOPY (EGD) WITH PROPOFOL N/A 05/07/2015   RMR: Status post dilation of normal  esophagus. Gastritis.  Marland Kitchen KNEE SURGERY Left    arthroscopy  . MANDIBLE FRACTURE SURGERY    . POLYPECTOMY  05/07/2015   Procedure: POLYPECTOMY (Hepatic Flexure, Distal Transverse Colon, Rectal);  Surgeon: [name], MD;  Location: [facility];  Service: Endoscopy;;  . TEE WITHOUT CARDIOVERSION N/A 03/28/2016   Procedure: TRANSESOPHAGEAL ECHOCARDIOGRAM (TEE);  Surgeon: [name], MD;  Location: [place];  Service: Open Heart Surgery;  Laterality: N/A;  . TONSILLECTOMY         Family History  Problem Relation Age of Onset  . Diabetes Father   . Hypertension Father   . Arthritis Other   . Diabetes Other   . Asthma Other   . Stroke Sister     Social History   Tobacco Use  . Smoking status: Current Every Day Smoker    Packs/day: 0.50    Years: 30.00    Pack years: 15.00    Types: Cigarettes  . Smokeless tobacco: Never  Used  . Tobacco comment: 3 cigs per day  Substance Use Topics  . Alcohol use: No  . Drug use: No    Home Medications Prior to Admission medications   Medication Sig Start Date End Date Taking? Authorizing Provider  albuterol (PROVENTIL HFA;VENTOLIN HFA) 108 (90 Base) MCG/ACT inhaler Inhale 1-2 puffs into the lungs every 6 (six) hours as needed for wheezing or shortness of breath.   Yes [provider]  amLODipine (NORVASC) 10 MG tablet Take 1 tablet (10 mg total) by mouth daily. Pt needs appt to continue to receive refills 11/15/18  Yes Herminio Commons, MD  baclofen (LIORESAL) 10 MG tablet Take 10 mg by mouth 2 (two) times daily. 07/23/19  Yes [provider]  clopidogrel (PLAVIX) 75 MG tablet Take 75 mg by mouth daily.    Yes [provider]  diazepam (VALIUM) 5 MG tablet Take 10 mg by mouth at bedtime. Can take 2 tablets at bedtime   Yes [provider]  divalproex (DEPAKOTE) 500 MG DR tablet Take 3 tablets (1,500 mg total) by mouth 2 (two) times daily. 05/07/19  Yes Penumalli, Vikram R, MD  escitalopram (LEXAPRO) 20 MG  tablet Take 20 mg by mouth daily.    Yes [provider]  hydrALAZINE (APRESOLINE) 50 MG tablet Take 50 mg by mouth 2 (two) times daily. 07/23/19  Yes [provider]  ibuprofen (ADVIL) 800 MG tablet Take 1 tablet (800 mg total) by mouth every 6 (six) hours as needed. 07/01/19  Yes [name], DPM  insulin aspart (NOVOLOG FLEXPEN) 100 UNIT/ML FlexPen Inject 5-8 Units into the skin 3 (three) times daily with meals.   Yes [provider]  Insulin Glargine (LANTUS SOLOSTAR) 100 UNIT/ML Solostar Pen Inject 24 Units into the skin at bedtime. 06/21/19  Yes Cassandria Anger, MD  lamoTRIgine (LAMICTAL) 25 MG tablet Take 50 mg by mouth 2 (two) times daily.  05/20/19  Yes [provider]  levETIRAcetam (KEPPRA) 1000 MG tablet Take 1,000 mg by mouth 2 (two) times daily. 08/16/19  Yes [provider]  lisinopril (ZESTRIL) 20 MG tablet Take 1 tablet (20 mg total) by mouth daily. 06/26/19 09/24/19 Yes Herminio Commons, MD  Melatonin 10 MG CAPS Take 1 capsule by mouth at bedtime. 06/12/19  Yes [provider]  nitroGLYCERIN (NITROSTAT) 0.4 MG SL tablet Place 1 tablet (0.4 mg total) under the tongue every 5 (five) minutes as needed for chest pain. 02/20/19 08/27/19 Yes Strader, Fransisco Hertz, PA-C  ranolazine (RANEXA) 500 MG 12 hr tablet TAKE (1) TABLET BY MOUTH TWICE DAILY. Patient taking differently: Take 500 mg by mouth 2 (two) times daily.  08/16/19  Yes Herminio Commons, MD  risperiDONE (RISPERDAL) 3 MG tablet Take 3 mg by mouth at bedtime. 08/16/19  Yes [provider]  Blood Glucose Monitoring Suppl (ACCU-CHEK GUIDE) w/Device KIT 1 Piece by Does not apply route as directed. Patient not taking: Reported on 08/27/2019 04/11/19   Cassandria Anger, MD  glucose blood (ACCU-CHEK GUIDE) test strip Use as instructed Patient not taking: Reported on 08/27/2019 04/11/19   Cassandria Anger, MD  ketorolac (TORADOL) 10 MG tablet Take 1 tablet (10  mg total) by mouth every 6 (six) hours as needed. Patient not taking: Reported on 08/27/2019 05/17/19   [name], DPM  paliperidone (INVEGA) 6 MG 24 hr tablet Take 12 mg by mouth daily.  02/12/19  [provider]    Allergies  Aspirin  Review of Systems   Review of Systems  Constitutional: Negative for chills and fever.  HENT: Negative for congestion, rhinorrhea and sore throat.   Eyes: Negative for visual disturbance.  Respiratory: Negative for cough and shortness of breath.   Cardiovascular: Positive for chest pain. Negative for leg swelling.  Gastrointestinal: Negative for abdominal pain, diarrhea, nausea and vomiting.  Genitourinary: Negative for dysuria.  Musculoskeletal: Negative for back pain and neck pain.  Skin: Negative for rash.  Neurological: Negative for dizziness, syncope, light-headedness and headaches.  Hematological: Does not bruise/bleed easily.  Psychiatric/Behavioral: Negative for confusion.    Physical Exam Updated Vital Signs BP (!) 171/104   Pulse 81   Temp 98.8 F (37.1 C) (Oral)   Resp (!) 22   Ht 1.702 m (5' 7" )   Wt 72.6 kg   SpO2 99%   BMI 25.06 kg/m   Physical Exam Vitals and nursing note reviewed.  Constitutional:      Appearance: Normal appearance. He is well-developed.  HENT:     Head: Normocephalic and atraumatic.  Eyes:     Extraocular Movements: Extraocular movements intact.     Conjunctiva/sclera: Conjunctivae normal.     Pupils: Pupils are equal, round, and reactive to light.  Cardiovascular:     Rate and Rhythm: Normal rate and regular rhythm.     Heart sounds: No murmur.  Pulmonary:     Effort: Pulmonary effort is normal. No respiratory distress.     Breath sounds: Normal breath sounds.  Chest:     Chest wall: No tenderness.  Abdominal:     Palpations: Abdomen is soft.     Tenderness: There is no abdominal tenderness.  Musculoskeletal:        General: No swelling.     Cervical back: Normal range of  motion and neck supple.  Skin:    General: Skin is warm and dry.     Capillary Refill: Capillary refill takes less than 2 seconds.  Neurological:     General: No focal deficit present.     Mental Status: He is alert and oriented to person, place, and time.     Cranial Nerves: No cranial nerve deficit.     Sensory: No sensory deficit.     Motor: No weakness.     ED Results / Procedures / Treatments   Labs (all labs ordered are listed, but only abnormal results are displayed) Labs Reviewed  COMPREHENSIVE METABOLIC PANEL - Abnormal; Notable for the following components:      Result Value   Glucose, Bld 138 (*)    Creatinine, Ser 1.41 (*)    Calcium 8.4 (*)    Total Protein 6.0 (*)    Albumin 3.3 (*)    GFR calc non Af Amer 56 (*)    All other components within normal limits  TROPONIN I (HIGH SENSITIVITY) - Abnormal; Notable for the following components:   Troponin I (High Sensitivity) 45 (*)    All other components within normal limits  BRAIN NATRIURETIC PEPTIDE  CBC  TROPONIN I (HIGH SENSITIVITY)    EKG EKG Interpretation  Date/Time:  Tuesday August 27 2019 17:20:31 EST Ventricular Rate:  101 PR Interval:    QRS Duration: 73 QT Interval:  342 QTC Calculation: 444 R Axis:   -70 Text Interpretation: Sinus tachycardia Probable left atrial enlargement Left anterior fascicular block Consider anterior infarct Confirmed by Fredia Sorrow [phone]) on 08/27/2019 5:32:57 PM   Radiology DG Chest Port 1 View  Result Date:  08/27/2019 CLINICAL DATA:  Chest pain EXAM: PORTABLE CHEST 1 VIEW COMPARISON:  02/12/2019 FINDINGS: 1745 hours. The lungs are clear without focal pneumonia, edema, pneumothorax or pleural effusion. Cardiopericardial silhouette is at upper limits of normal for size. Status post CABG. The visualized bony structures of the thorax are intact. Telemetry leads overlie the chest. IMPRESSION: No active disease. Electronically Signed   By: Misty Stanley M.D.   On:  08/27/2019 18:01    Procedures Procedures (including critical care time)  Medications Ordered in ED Medications  acetaminophen (TYLENOL) tablet 650 mg (650 mg Oral Refused 08/27/19 2051)  clopidogrel (PLAVIX) tablet 75 mg (75 mg Oral Given 08/27/19 1759)    ED Course  I have reviewed the triage vital signs and the nursing notes.  Pertinent labs & imaging results that were available during my care of the patient were reviewed by me and considered in my medical decision making (see chart for details).    MDM Rules/Calculators/A&P                       EKG without STEMI changes.  However it did raise some concerns of possible anterior infarct.  He had heart rate was 101.  On cardiac monitoring came down into the 80s.  Did have a left anterior fascicular block.  Chest x-ray was negative for any acute findings.  Labs without significant abnormalities initial troponin was 6.  Repeat troponin that was somewhat delayed so little beyond 2 hours came back at 45.  This is concerning fairly significant delta change patient said that he has been chest pain-free since 6 PM.  Recommended admission for serial troponins patient refused.  Patient understands that he could be developing MI changes in standard of care means admission that something bad could happen during the night including death.  Patient understood said he will not be admitted here today.  He will either go to [facility] or follow-up with his cardiologist.  States he will return if he gets recurrent chest pain.  Patient was adamant about this.    Final Clinical Impression(s) / ED Diagnoses Final diagnoses:  Precordial pain  Elevated troponin    Rx / DC Orders ED Discharge Orders    None       Fredia Sorrow, MD 08/27/19 2226

## 2019-08-27 NOTE — ED Triage Notes (Signed)
Pt states he thinks he is having a heart attack. Left sided chest pain that is sharp and stabbing. History of heart attack before and open heart surgery. Pt states it's hard to take a deep breath.

## 2019-08-28 ENCOUNTER — Emergency Department (HOSPITAL_COMMUNITY): Payer: Medicaid Other

## 2019-08-28 ENCOUNTER — Emergency Department (HOSPITAL_COMMUNITY)
Admission: EM | Admit: 2019-08-28 | Discharge: 2019-08-28 | Disposition: A | Discharge status: Home / Self Care | Payer: Medicaid Other | Attending: Emergency Medicine | Admitting: Emergency Medicine

## 2019-08-28 ENCOUNTER — Encounter (HOSPITAL_COMMUNITY): Payer: Self-pay | Admitting: Emergency Medicine

## 2019-08-28 ENCOUNTER — Telehealth: Payer: Self-pay | Admitting: Cardiovascular Disease

## 2019-08-28 DIAGNOSIS — Z79899 Other long term (current) drug therapy: Secondary | ICD-10-CM | POA: Insufficient documentation

## 2019-08-28 DIAGNOSIS — E119 Type 2 diabetes mellitus without complications: Secondary | ICD-10-CM | POA: Insufficient documentation

## 2019-08-28 DIAGNOSIS — R0789 Other chest pain: Secondary | ICD-10-CM | POA: Diagnosis present

## 2019-08-28 DIAGNOSIS — I2510 Atherosclerotic heart disease of native coronary artery without angina pectoris: Secondary | ICD-10-CM | POA: Insufficient documentation

## 2019-08-28 DIAGNOSIS — R079 Chest pain, unspecified: Secondary | ICD-10-CM

## 2019-08-28 DIAGNOSIS — Z20822 Contact with and (suspected) exposure to covid-19: Secondary | ICD-10-CM | POA: Insufficient documentation

## 2019-08-28 DIAGNOSIS — F17210 Nicotine dependence, cigarettes, uncomplicated: Secondary | ICD-10-CM | POA: Insufficient documentation

## 2019-08-28 DIAGNOSIS — Z794 Long term (current) use of insulin: Secondary | ICD-10-CM | POA: Diagnosis not present

## 2019-08-28 DIAGNOSIS — I10 Essential (primary) hypertension: Secondary | ICD-10-CM | POA: Diagnosis not present

## 2019-08-28 DIAGNOSIS — Z951 Presence of aortocoronary bypass graft: Secondary | ICD-10-CM | POA: Insufficient documentation

## 2019-08-28 LAB — BASIC METABOLIC PANEL
Anion gap: 6 (ref 5–15)
BUN: 15 mg/dL (ref 6–20)
CO2: 28 mmol/L (ref 22–32)
Calcium: 8.6 mg/dL — ABNORMAL LOW (ref 8.9–10.3)
Chloride: 103 mmol/L (ref 98–111)
Creatinine, Ser: 1.36 mg/dL — ABNORMAL HIGH (ref 0.61–1.24)
GFR calc Af Amer: >60 mL/min (ref >60)
GFR calc non Af Amer: 58 mL/min — ABNORMAL LOW (ref >60)
Glucose, Bld: 188 mg/dL — ABNORMAL HIGH (ref 70–99)
Potassium: 3.7 mmol/L (ref 3.5–5.1)
Sodium: 137 mmol/L (ref 135–145)

## 2019-08-28 LAB — RESPIRATORY PANEL BY RT PCR (FLU A&B, COVID)
Influenza A by PCR: NEGATIVE
Influenza B by PCR: NEGATIVE
SARS Coronavirus 2 by RT PCR: NEGATIVE

## 2019-08-28 LAB — CBC
HCT: 38.4 % — ABNORMAL LOW (ref 39.0–52.0)
Hemoglobin: 13.4 g/dL (ref 13.0–17.0)
MCH: 31.4 pg (ref 26.0–34.0)
MCHC: 34.9 g/dL (ref 30.0–36.0)
MCV: 89.9 fL (ref 80.0–100.0)
Platelets: 177 10*3/uL (ref 150–400)
RBC: 4.27 MIL/uL (ref 4.22–5.81)
RDW: 12.5 % (ref 11.5–15.5)
WBC: 4.1 10*3/uL (ref 4.0–10.5)
nRBC: 0 % (ref 0.0–0.2)

## 2019-08-28 LAB — TROPONIN I (HIGH SENSITIVITY)
Troponin I (High Sensitivity): 11 ng/L (ref <18)
Troponin I (High Sensitivity): 10 ng/L (ref <18)

## 2019-08-28 NOTE — ED Triage Notes (Signed)
Patient having left-sided chest pain that restarted this am, was at this ED last night for MI, was to be admitted, left AMA.

## 2019-08-28 NOTE — ED Provider Notes (Signed)
[facility] EMERGENCY DEPARTMENT Provider Note   CSN: [id number] Arrival date & time: 08/28/19  0840     History Chief Complaint  Patient presents with  . Chest Pain    Was here last night told he had MI, was to be admitted left AMA, back for recurring chest pain.    Melvin Taylor is a 54 y.o. male.  HPI He presents for recurrent chest pain.  He was seen last evening with similar pain, in his left anterior chest.  He states the pain did not return until this morning after he awoke.  He had trouble sleeping last night because "I was worried about it."  Patient has been offered admission, but declined.  He states that he had wanted to "go to [facility] to be admitted," but today, decided come back here for reevaluation.  He denies associated nausea, vomiting, diaphoresis, weakness, shortness of breath or dizziness.  He has mild cough which is nonproductive.  He is a smoker.  He has not had any fever.  He denies known exposure to COVID-19.  He has cardiac history of coronary artery bypass grafting, and had a stress test, last year which was normal.  He is taking his usual medications.  There are no other known modifying factors.    Past Medical History:  Diagnosis Date  . Anxiety   . Bipolar 1 disorder ([place])   . Bone spur    left heel  . Cervical radiculopathy   . Chest pain   . Chronic back pain   . Chronic chest wall pain   . Chronic neck pain   . Coronary artery disease    a. s/p CABG in 03/2016 with LIMA-LAD, SVG-D1, SVG-RCA, and Seq SVG-mid and distal OM  . Depression   . Diabetes mellitus    Type II  . Hallucinations    "long history of them"  . Headache(784.0)   . History of gout   . HTN (hypertension)   . Insomnia   . Myocardial infarction ([place]) 2017  . Neuropathy   . Pain management   . Polysubstance abuse ([place])   . Right leg pain    chronic  . Schizophrenia ([place])   . Seizures ([place])    last sz between July 5-9th, 2016; epilepsy  . Shortness of breath  dyspnea    with exertion  . Sleep apnea   . Transfusion of blood product refused for religious reason     Patient Active Problem List   Diagnosis Date Noted  . Current smoker 06/21/2019  . Seizure-like activity ([place]) 05/06/2019  . Type 2 diabetes mellitus with stage 3 chronic kidney disease, with long-term current use of insulin ([place]) 04/11/2019  . Anemia, chronic disease 07/04/2018  . Other specified diseases of the digestive system 10/30/2017  . CAD (coronary artery disease) 03/28/2016  . Chest pain 03/21/2016  . Mixed hyperlipidemia 03/11/2016  . Ischemic chest pain ([place])   . NSTEMI (non-ST elevated myocardial infarction) ([place])   . Coronary atherosclerosis of native coronary artery 03/03/2016  . Cervical spinal stenosis 12/14/2015  . Loss of weight 08/25/2015  . Essential hypertension, benign 07/23/2015  . Personal history of noncompliance with medical treatment, presenting hazards to health 07/23/2015  . Abdominal pain 06/08/2015  . Constipation 06/08/2015  . History of colonic polyps   . Diverticulosis of colon without hemorrhage   . Mucosal abnormality of stomach   . Encounter for screening colonoscopy 04/16/2015  . Dysphagia 04/16/2015  . Partial symptomatic epilepsy with  complex partial seizures, intractable, without status epilepticus ([place]) 11/10/2014  . Bipolar disorder, unspecified ([place]) 10/15/2014  . Schizophrenia ([place]) 10/15/2014  . DM type 2 causing vascular disease ([place]) 06/29/2014  . Musculoskeletal pain 06/29/2014  . Hyperglycemia without ketosis 09/07/2013  . 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 Other internal derangements of unspecified knee 01/13/2011  . Knee pain 12/28/2010  . Medial meniscus, posterior horn derangement 12/28/2010    Past Surgical History:  Procedure Laterality Date  . ANTERIOR CERVICAL DECOMP/DISCECTOMY FUSION N/A 12/14/2015   Procedure: ANTERIOR CERVICAL DECOMPRESSION/DISCECTOMY FUSION CERVICAL FIVE -SIX;  Surgeon:  Earnie Larsson, MD;  Location: [place] [facility];  Service: Neurosurgery;  Laterality: N/A;  . BIOPSY  05/07/2015   Procedure: BIOPSY (Gastric);  Surgeon: [name], MD;  Location: [facility];  Service: Endoscopy;;  . CARDIAC CATHETERIZATION N/A 03/07/2016   Procedure: Left Heart Cath and Coronary Angiography;  Surgeon: Belva Crome, MD;  Location: [place] [facility];  Service: Cardiovascular;  Laterality: N/A;  . COLONOSCOPY WITH PROPOFOL N/A 05/07/2015   [id number] diverticulosis, multiple colon polyps removed, tubular adenoma, serrated colon polyp. Next colonoscopy October 2019  . COLONOSCOPY WITH PROPOFOL N/A 08/02/2018   Procedure: COLONOSCOPY WITH PROPOFOL;  Surgeon: [name], MD;  Location: [facility];  Service: Endoscopy;  Laterality: N/A;  12:00pm  . CORONARY ARTERY BYPASS GRAFT N/A 03/28/2016   Procedure: CORONARY ARTERY BYPASS GRAFTING (CABG) x 5 USING GREATER SAPHENOUS VEIN;  Surgeon: [name], MD;  Location: [place] [facility];  Service: Open Heart Surgery;  Laterality: N/A;  . ENDOVEIN HARVEST OF GREATER SAPHENOUS VEIN Right 03/28/2016   Procedure: ENDOVEIN HARVEST OF GREATER SAPHENOUS VEIN;  Surgeon: [name], MD;  Location: [place];  Service: Open Heart Surgery;  Laterality: Right;  . ESOPHAGEAL DILATION N/A 05/07/2015   Procedure: ESOPHAGEAL DILATION WITH 56FR MALONEY DILATOR;  Surgeon: [name], MD;  Location: [facility];  Service: Endoscopy;  Laterality: N/A;  . ESOPHAGOGASTRODUODENOSCOPY (EGD) WITH PROPOFOL N/A 05/07/2015   RMR: Status post dilation of normal esophagus. Gastritis.  Marland Kitchen KNEE SURGERY Left    arthroscopy  . MANDIBLE FRACTURE SURGERY    . POLYPECTOMY  05/07/2015   Procedure: POLYPECTOMY (Hepatic Flexure, Distal Transverse Colon, Rectal);  Surgeon: [name], MD;  Location: [facility];  Service: Endoscopy;;  . TEE WITHOUT CARDIOVERSION N/A 03/28/2016   Procedure: TRANSESOPHAGEAL ECHOCARDIOGRAM (TEE);  Surgeon: [name], MD;  Location: [place];  Service: Open Heart  Surgery;  Laterality: N/A;  . TONSILLECTOMY         Family History  Problem Relation Age of Onset  . Diabetes Father   . Hypertension Father   . Arthritis Other   . Diabetes Other   . Asthma Other   . Stroke Sister     Social History   Tobacco Use  . Smoking status: Current Every Day Smoker    Packs/day: 0.50    Years: 30.00    Pack years: 15.00    Types: Cigarettes  . Smokeless tobacco: Never Used  . Tobacco comment: 3 cigs per day  Substance Use Topics  . Alcohol use: No  . Drug use: No    Home Medications Prior to Admission medications   Medication Sig Start Date End Date Taking? Authorizing Provider  albuterol (PROVENTIL HFA;VENTOLIN HFA) 108 (90 Base) MCG/ACT inhaler Inhale 2 puffs into the lungs every 4 (four) hours as needed for wheezing or shortness of breath.    Yes [provider]  amLODipine (NORVASC) 10 MG tablet Take 1 tablet (10 mg total) by mouth daily. Pt needs appt to continue to receive refills 11/15/18  Yes Herminio Commons, MD  clopidogrel (PLAVIX) 75 MG tablet Take 75 mg by mouth daily.    Yes [provider]  diazepam (VALIUM) 5 MG tablet Take 5 mg by mouth 2 (two) times daily as needed for anxiety.    Yes [provider]  divalproex (DEPAKOTE) 500 MG DR tablet Take 3 tablets (1,500 mg total) by mouth 2 (two) times daily. 05/07/19  Yes Penumalli, Vikram R, MD  escitalopram (LEXAPRO) 20 MG tablet Take 20 mg by mouth daily.    Yes [provider]  glucose blood (ACCU-CHEK GUIDE) test strip Use as instructed 04/11/19  Yes Nida, Marella Chimes, MD  hydrALAZINE (APRESOLINE) 50 MG tablet Take 50 mg by mouth 2 (two) times daily. 07/23/19  Yes [provider]  ibuprofen (ADVIL) 800 MG tablet Take 1 tablet (800 mg total) by mouth every 6 (six) hours as needed. 07/01/19  Yes [name], DPM  Insulin Glargine (LANTUS SOLOSTAR) 100 UNIT/ML Solostar Pen Inject 24 Units into the skin at bedtime. Patient taking  differently: Inject 26 Units into the skin at bedtime.  06/21/19  Yes Nida, Marella Chimes, MD  insulin lispro (HUMALOG) 100 UNIT/ML injection Inject 2 Units into the skin 3 (three) times daily with meals. Inject 2 units three times daily before meals   Yes [provider]  lamoTRIgine (LAMICTAL) 25 MG tablet Take 50 mg by mouth 2 (two) times daily.  05/20/19  Yes [provider]  levETIRAcetam (KEPPRA) 1000 MG tablet Take 1,000 mg by mouth 2 (two) times daily. 08/16/19  Yes [provider]  lisinopril (ZESTRIL) 20 MG tablet Take 1 tablet (20 mg total) by mouth daily. 06/26/19 09/24/19 Yes Herminio Commons, MD  ranolazine (RANEXA) 500 MG 12 hr tablet TAKE (1) TABLET BY MOUTH TWICE DAILY. Patient taking differently: Take 500 mg by mouth 2 (two) times daily.  08/16/19  Yes Herminio Commons, MD  risperiDONE (RISPERDAL) 3 MG tablet Take 3 mg by mouth at bedtime. 08/16/19  Yes [provider]  baclofen (LIORESAL) 10 MG tablet Take 10 mg by mouth 2 (two) times daily. 07/23/19   [provider]  insulin aspart (NOVOLOG FLEXPEN) 100 UNIT/ML FlexPen Inject 5-8 Units into the skin 3 (three) times daily with meals.    [provider]  ketorolac (TORADOL) 10 MG tablet Take 1 tablet (10 mg total) by mouth every 6 (six) hours as needed. Patient not taking: Reported on 08/27/2019 05/17/19   [name], DPM  Melatonin 10 MG CAPS Take 1 capsule by mouth at bedtime. 06/12/19   [provider]  nitroGLYCERIN (NITROSTAT) 0.4 MG SL tablet Place 1 tablet (0.4 mg total) under the tongue every 5 (five) minutes as needed for chest pain. 02/20/19 08/27/19  Strader, Fransisco Hertz, PA-C  paliperidone (INVEGA) 6 MG 24 hr tablet Take 12 mg by mouth daily.  02/12/19  [provider]    Allergies    Aspirin  Review of Systems   Review of Systems  All other systems reviewed and are negative.   Physical Exam Updated Vital Signs BP (!) 174/103    Pulse 71   Temp 99.1 F (37.3 C) (Oral)   Resp (!) 23   Ht 5\' 7"  (1.702 m)   Wt 72.6 kg   SpO2 100%   BMI 25.06 kg/m  Physical Exam Vitals and nursing note reviewed.  Constitutional:      General: He is not in acute distress.    Appearance: He is well-developed. He is not ill-appearing, toxic-appearing or diaphoretic.  HENT:     Head: Normocephalic and atraumatic.     Right Ear: External ear normal.     Left Ear: External ear normal.  Eyes:     Conjunctiva/sclera: Conjunctivae normal.     Pupils: Pupils are equal, round, and reactive to light.  Neck:     Trachea: Phonation normal.  Cardiovascular:     Rate and Rhythm: Normal rate and regular rhythm.     Heart sounds: Normal heart sounds.  Pulmonary:     Effort: Pulmonary effort is normal.     Breath sounds: Normal breath sounds.  Abdominal:     General: There is no distension.     Palpations: Abdomen is soft.     Tenderness: There is no abdominal tenderness.  Musculoskeletal:        General: Normal range of motion.     Cervical back: Normal range of motion and neck supple.  Skin:    General: Skin is warm and dry.  Neurological:     Mental Status: He is alert and oriented to person, place, and time.     Cranial Nerves: No cranial nerve deficit.     Sensory: No sensory deficit.     Motor: No abnormal muscle tone.     Coordination: Coordination normal.  Psychiatric:        Mood and Affect: Mood normal.        Behavior: Behavior normal.        Thought Content: Thought content normal.        Judgment: Judgment normal.     ED Results / Procedures / Treatments   Labs (all labs ordered are listed, but only abnormal results are displayed) Labs Reviewed  BASIC METABOLIC PANEL - Abnormal; Notable for the following components:      Result Value   Glucose, Bld 188 (*)    Creatinine, Ser 1.36 (*)    Calcium 8.6 (*)    GFR calc non Af Amer 58 (*)    All other components within normal limits  CBC - Abnormal; Notable  for the following components:   HCT 38.4 (*)    All other components within normal limits  RESPIRATORY PANEL BY RT PCR (FLU A&B, COVID)  TROPONIN I (HIGH SENSITIVITY)  TROPONIN I (HIGH SENSITIVITY)    EKG None     Date: 08/28/19  Rate: 71  Rhythm: normal sinus rhythm  QRS Axis: left  PR and QT Intervals: normal  ST/T Wave abnormalities: nonspecific ST changes  PR and QRS Conduction Disutrbances:left anterior fascicular block  Narrative Interpretation: Anterior Q waves  Old EKG Reviewed: Similar to 07/17/2019   Radiology DG Chest 2 View  Result Date: 08/28/2019 CLINICAL DATA:  Chest pain EXAM: CHEST - 2 VIEW COMPARISON:  08/27/2019 FINDINGS: Postop CABG. Heart size upper normal. Negative for heart failure. Lungs are clear without infiltrate or effusion. No acute skeletal abnormality.  ACDF in the cervical spine. IMPRESSION: No active cardiopulmonary disease. Electronically Signed   By: Franchot Gallo M.D.   On: 08/28/2019 09:47   DG Chest Port 1 View  Result Date: 08/27/2019 CLINICAL DATA:  Chest pain EXAM: PORTABLE CHEST 1 VIEW COMPARISON:  02/12/2019 FINDINGS: 1745 hours. The lungs are clear without focal pneumonia, edema, pneumothorax or pleural effusion. Cardiopericardial silhouette is at upper  limits of normal for size. Status post CABG. The visualized bony structures of the thorax are intact. Telemetry leads overlie the chest. IMPRESSION: No active disease. Electronically Signed   By: Misty Stanley M.D.   On: 08/27/2019 18:01    Procedures Procedures (including critical care time)  Medications Ordered in ED Medications - No data to display  ED Course  I have reviewed the triage vital signs and the nursing notes.  Pertinent labs & imaging results that were available during my care of the patient were reviewed by me and considered in my medical decision making (see chart for details).  Clinical Course as of Aug 27 1244  Wed Aug 28, 2019  1212 Normal delta troponin;  initial 11  Troponin I (High Sensitivity) [EW]  1212 Normal  Respiratory Panel by RT PCR (Flu A&B, Covid) - Nasopharyngeal Swab [EW]  1212 Normal, except glucose high, creatinine high, calcium low  Basic metabolic panel(!) [EW]  [id number] Normal  CBC(!) [EW]  1235 Case discussed with Dr. Domenic Polite, cardiology, who agrees with discharge and will arrange for patient to follow-up with his cardiologist for checkup.   [EW]    Clinical Course User Index [EW] Daleen Bo, MD   MDM Rules/Calculators/A&P                       Patient Vitals for the past 24 hrs:  BP Temp Temp src Pulse Resp SpO2 Height Weight  08/28/19 0945 -- -- -- 71 (!) 23 100 % -- --  08/28/19 0930 (!) 174/103 -- -- 69 15 99 % -- --  08/28/19 0900 (!) 186/96 -- -- 65 (!) 21 100 % -- --  08/28/19 0854 (!) 168/98 99.1 F (37.3 C) Oral 69 (!) 22 100 % -- --  08/28/19 0848 -- -- -- -- -- -- 5\' 7"  (1.702 m) 72.6 kg    12:15 PM Reevaluation with update and discussion. After initial assessment and treatment, an updated evaluation reveals he is comfortable at this time has no further complaints.  He does not appear to be in distress.  He states his pain is currently 6/10.  Findings discussed and questions answered. Daleen Bo   Medical Decision Making: Nonspecific chest pain, with reassuring evaluation today.  Patient had an elevated troponin on second test last evening, with completely normal troponins x2 today.  This indicates that the second troponin last evening is spurious and not indicative of acute cardiac damage.  Patient appears to be at his baseline is stable for discharge.  He likely has musculoskeletal chest pain.  No indication for hospitalization at this time.  CRITICAL CARE-no Performed by: Daleen Bo   Nursing Notes Reviewed/ Care Coordinated Applicable Imaging Reviewed Interpretation of Laboratory Data incorporated into ED treatment  The patient appears reasonably screened and/or stabilized for discharge  and I doubt any other medical condition or other [facility] requiring further screening, evaluation, or treatment in the ED at this time prior to discharge.  Plan: Home Medications-continue usual medicines and use OTC analgesia of choice; Home Treatments-heat to affected area; return here if the recommended treatment, does not improve the symptoms; Recommended follow up-cardiology follow-up for ongoing management of chest pain and coronary disease.   Final Clinical Impression(s) / ED Diagnoses Final diagnoses:  Nonspecific chest pain    Rx / DC Orders ED Discharge Orders    None       Daleen Bo, MD 08/28/19 1247

## 2019-08-28 NOTE — Telephone Encounter (Signed)
States he had heart attack yesterday and wants some thing for pain

## 2019-08-28 NOTE — Discharge Instructions (Addendum)
Your cardiologist office will call you to schedule a follow-up appointment to be seen about the chest pain.  You can use Tylenol Motrin and heat on the sore area as needed for pain.  Return here, if needed.

## 2019-08-28 NOTE — Telephone Encounter (Signed)
He did not have a heart attack. I reviewed labs, notes, and studies from his ER evaluation. It was felt to be his chronic musculoskeletal chest wall pain. I agree with encouraging him to speak with his PCP about a pain management referral.

## 2019-08-28 NOTE — Telephone Encounter (Signed)
Returned pt call. Pt states that he is requesting something for chest pain other than nitro. When informed that we do not prescribe pain medications pt asked for something for a headache. Pt encouraged to call PCP. Please advise

## 2019-08-28 NOTE — Progress Notes (Signed)
Discharge instructions reviewed, patient verbalized understanding.

## 2019-09-03 NOTE — Progress Notes (Signed)
Cardiology Office Note    Date:  09/09/2019   ID:  [name], DOB [birth year], MRN [id number]  PCP:  Rosita Fire, MD  Cardiologist: Kate Sable, MD EPS: None  Chief Complaint  Patient presents with  . Hospitalization Follow-up    History of Present Illness:  Melvin Taylor is a 54 y.o. male with history of CAD status post CABG times 5- 03/28/16 with chronic musculoskeletal chest wall and left arm pain since then.  Worse with movement and lifting heavy activities.  Multiple ER visits for chest pain.  Lexiscan Myoview 02/27/2019 prior inferior MI with no ischemia LVEF 32%.  Echo [id number] normal LV systolic function LVEF 60 to 65% with mild to moderate concentric LVH.   Last saw Dr. Bronson Ing 06/2019 with having trouble with plantar fasciitis.  Patient was in ER 08/27/19 with chest pain and initial troponin was 45 but then 11 and 10. BP very high. PCP increased hydralazine from 50 to 100 mg daily and after taking one dose patient believes that's what caused his heart attack. He stopped it completely. Smoking less than 1 ppd. Has continual chest pain worse in am. Asking for pain medication.    Past Medical History:  Diagnosis Date  . Anxiety   . Bipolar 1 disorder ([place])   . Bone spur    left heel  . Cervical radiculopathy   . Chest pain   . Chronic back pain   . Chronic chest wall pain   . Chronic neck pain   . Coronary artery disease    a. s/p CABG in 03/2016 with LIMA-LAD, SVG-D1, SVG-RCA, and Seq SVG-mid and distal OM  . Depression   . Diabetes mellitus    Type II  . Hallucinations    "long history of them"  . Headache(784.0)   . History of gout   . HTN (hypertension)   . Insomnia   . Myocardial infarction ([place]) 2017  . Neuropathy   . Pain management   . Polysubstance abuse ([place])   . Right leg pain    chronic  . Schizophrenia ([place])   . Seizures ([place])    last sz between July 5-9th, 2016; epilepsy  . Shortness of breath dyspnea    with exertion    . Sleep apnea   . Transfusion of blood product refused for religious reason     Past Surgical History:  Procedure Laterality Date  . ANTERIOR CERVICAL DECOMP/DISCECTOMY FUSION N/A 12/14/2015   Procedure: ANTERIOR CERVICAL DECOMPRESSION/DISCECTOMY FUSION CERVICAL FIVE -SIX;  Surgeon: Earnie Larsson, MD;  Location: [facility];  Service: Neurosurgery;  Laterality: N/A;  . BIOPSY  05/07/2015   Procedure: BIOPSY (Gastric);  Surgeon: [name], MD;  Location: [facility];  Service: Endoscopy;;  . CARDIAC CATHETERIZATION N/A 03/07/2016   Procedure: Left Heart Cath and Coronary Angiography;  Surgeon: Belva Crome, MD;  Location: [facility];  Service: Cardiovascular;  Laterality: N/A;  . COLONOSCOPY WITH PROPOFOL N/A 05/07/2015   [id number] diverticulosis, multiple colon polyps removed, tubular adenoma, serrated colon polyp. Next colonoscopy October 2019  . COLONOSCOPY WITH PROPOFOL N/A 08/02/2018   Procedure: COLONOSCOPY WITH PROPOFOL;  Surgeon: [name], MD;  [facility]: [facility];  Service: Endoscopy;  Laterality: N/A;  12:00pm  . CORONARY ARTERY BYPASS GRAFT N/A 03/28/2016   Procedure: CORONARY ARTERY BYPASS GRAFTING (CABG) x 5 USING GREATER SAPHENOUS VEIN;  Surgeon: [name], MD;  Location: [facility];  Service: Open Heart Surgery;  Laterality: N/A;  .  ENDOVEIN HARVEST OF GREATER SAPHENOUS VEIN Right 03/28/2016   Procedure: ENDOVEIN HARVEST OF GREATER SAPHENOUS VEIN;  Surgeon: [name], MD;  Location: [facility];  Service: Open Heart Surgery;  Laterality: Right;  . ESOPHAGEAL DILATION N/A 05/07/2015   Procedure: ESOPHAGEAL DILATION WITH 56FR MALONEY DILATOR;  Surgeon: [name], MD;  Location: [facility];  Service: Endoscopy;  Laterality: N/A;  . ESOPHAGOGASTRODUODENOSCOPY (EGD) WITH PROPOFOL N/A 05/07/2015   RMR: Status post dilation of normal esophagus. Gastritis.  Marland Kitchen KNEE SURGERY Left    arthroscopy  . MANDIBLE FRACTURE SURGERY    . POLYPECTOMY  05/07/2015   Procedure:  POLYPECTOMY (Hepatic Flexure, Distal Transverse Colon, Rectal);  Surgeon: [name], MD;  [facility]: [facility];  Service: Endoscopy;;  . TEE WITHOUT CARDIOVERSION N/A 03/28/2016   Procedure: TRANSESOPHAGEAL ECHOCARDIOGRAM (TEE);  Surgeon: [name], MD;  Location: [facility];  Service: Open Heart Surgery;  Laterality: N/A;  . TONSILLECTOMY      Current Medications: Current Meds  Medication Sig  . albuterol (PROVENTIL HFA;VENTOLIN HFA) 108 (90 Base) MCG/ACT inhaler Inhale 2 puffs into the lungs every 4 (four) hours as needed for wheezing or shortness of breath.   Marland Kitchen amLODipine (NORVASC) 10 MG tablet Take 1 tablet (10 mg total) by mouth daily. Pt needs appt to continue to receive refills  . baclofen (LIORESAL) 10 MG tablet Take 10 mg by mouth 2 (two) times daily.  . clopidogrel (PLAVIX) 75 MG tablet Take 75 mg by mouth daily.   . diazepam (VALIUM) 5 MG tablet Take 5 mg by mouth 2 (two) times daily as needed for anxiety.   . divalproex (DEPAKOTE) 500 MG DR tablet Take 3 tablets (1,500 mg total) by mouth 2 (two) times daily.  Marland Kitchen escitalopram (LEXAPRO) 20 MG tablet Take 20 mg by mouth daily.   Marland Kitchen glucose blood (ACCU-CHEK GUIDE) test strip Use as instructed  . ibuprofen (ADVIL) 800 MG tablet Take 1 tablet (800 mg total) by mouth every 6 (six) hours as needed.  . insulin aspart (NOVOLOG FLEXPEN) 100 UNIT/ML FlexPen Inject 5-8 Units into the skin 3 (three) times daily with meals.  . Insulin Glargine (LANTUS SOLOSTAR) 100 UNIT/ML Solostar Pen Inject 24 Units into the skin at bedtime. (Patient taking differently: Inject 26 Units into the skin at bedtime. )  . insulin lispro (HUMALOG) 100 UNIT/ML injection Inject 2 Units into the skin 3 (three) times daily with meals. Inject 2 units three times daily before meals  . lamoTRIgine (LAMICTAL) 25 MG tablet Take 50 mg by mouth 2 (two) times daily.   Marland Kitchen lisinopril (ZESTRIL) 20 MG tablet Take 1 tablet (20 mg total) by mouth daily.  . Melatonin 10 MG CAPS Take 1  capsule by mouth at bedtime.  . nitroGLYCERIN (NITROSTAT) 0.4 MG SL tablet Place 1 tablet (0.4 mg total) under the tongue every 5 (five) minutes as needed for chest pain.  . ranolazine (RANEXA) 500 MG 12 hr tablet TAKE (1) TABLET BY MOUTH TWICE DAILY. (Patient taking differently: Take 500 mg by mouth 2 (two) times daily. )  . risperiDONE (RISPERDAL) 3 MG tablet Take 3 mg by mouth at bedtime.  . [DISCONTINUED] hydrALAZINE (APRESOLINE) 50 MG tablet Take 50 mg by mouth 2 (two) times daily.  . [DISCONTINUED] levETIRAcetam (KEPPRA) 1000 MG tablet Take 1,000 mg by mouth 2 (two) times daily.     Allergies:   Aspirin   Social History   Socioeconomic History  . Marital status: Single    Spouse  name: Not on file  . Number of children: Not on file  . Years of education: 11th grade  . Highest education level: Not on file  Occupational History  . Occupation: unemployed    Fish farm manager: NOT EMPLOYED  Tobacco Use  . Smoking status: Current Every Day Smoker    Packs/day: 0.50    Years: 30.00    Pack years: 15.00    Types: Cigarettes  . Smokeless tobacco: Never Used  . Tobacco comment: 3 cigs per day  Substance and Sexual Activity  . Alcohol use: No  . Drug use: No  . Sexual activity: Not Currently  Other Topics Concern  . Not on file  Social History Narrative   Lives alone   Drinks a cup of coffee a day   Social Determinants of Health   Financial Resource Strain:   . Difficulty of Paying Living Expenses: Not on file  Food Insecurity:   . Worried About Charity fundraiser in the Last Year: Not on file  . Ran Out of Food in the Last Year: Not on file  Transportation Needs:   . Lack of Transportation (Medical): Not on file  . Lack of Transportation (Non-Medical): Not on file  Physical Activity:   . Days of Exercise per Week: Not on file  . Minutes of Exercise per Session: Not on file  Stress:   . Feeling of Stress : Not on file  Social Connections:   . Frequency of Communication with  Friends and Family: Not on file  . Frequency of Social Gatherings with Friends and Family: Not on file  . Attends Religious Services: Not on file  . Active Member of Clubs or Organizations: Not on file  . Attends Archivist Meetings: Not on file  . Marital Status: Not on file     Family History:  The patient's family history includes Arthritis in an other family member; Asthma in an other family member; Diabetes in his father and another family member; Hypertension in his father; Stroke in his sister.   ROS:   Please see the history of present illness.    ROS All other systems reviewed and are negative.   PHYSICAL EXAM:   VS:  BP (!) 171/108   Pulse 62   Temp 99.6 F (37.6 C) (Temporal)   Ht 5\' 7"  (1.702 m)   Wt 155 lb (70.3 kg)   SpO2 99%   BMI 24.28 kg/m   Physical Exam  GEN: Well nourished, well developed, in no acute distress  Neck: no JVD, carotid bruits, or masses Cardiac:RRR;S4. no murmurs, rubs  Respiratory:  clear to auscultation bilaterally, normal work of breathing GI: soft, nontender, nondistended, + BS Ext: without cyanosis, clubbing, or edema, Good distal pulses bilaterally Neuro:  Alert and Oriented x 3 Psych: euthymic mood, full affect  Wt Readings from Last 3 Encounters:  09/09/19 155 lb (70.3 kg)  08/28/19 160 lb (72.6 kg)  08/27/19 160 lb (72.6 kg)      Studies/Labs Reviewed:   EKG:  EKG is not ordered today.  The ekg reviewed from ER NSR old ant wall MI LAFB, no change. Recent Labs: 08/27/2019: ALT 15; B Natriuretic Peptide 40.0 08/28/2019: BUN 15; Creatinine, Ser 1.36; Hemoglobin 13.4; Platelets 177; Potassium 3.7; Sodium 137   Lipid Panel    Component Value Date/Time   CHOL 172 07/20/2018 1002   TRIG 93 07/20/2018 1002   HDL 78 07/20/2018 1002   CHOLHDL 2.0 03/22/2016 0853   VLDL  7 03/22/2016 0853   LDLCALC 75 07/20/2018 1002    Additional studies/ records that were reviewed today include:   2D echo 03/04/2019 IMPRESSIONS      1. The left ventricle has normal systolic function with an ejection  fraction of 60-65%. The cavity size was normal. Mild to moderate  concentric LVH. Left ventricular diastolic Doppler parameters are  indeterminate. No evidence of left ventricular  regional wall motion abnormalities.   2. Right atrial size was mildly dilated.   3. The mitral valve is grossly normal. Mild thickening of the mitral  valve leaflet.   4. The tricuspid valve is grossly normal.   5. The aortic valve is tricuspid.   6. The aorta is normal in size and structure.   FINDINGS   Left Ventricle: The left ventricle has normal systolic function, with an  ejection fraction of 60-65%. The cavity size was normal. Mild to moderate  concentric LVH. Left ventricular diastolic Doppler parameters are  indeterminate. No evidence of left  ventricular regional wall motion abnormalities..   Right Ventricle: The right ventricle has low normal systolic function. The  cavity was normal. There is no increase in right ventricular wall  thickness.   Left Atrium: Left atrial size was normal in size.   Right Atrium: Right atrial size was mildly dilated. Right atrial pressure  is estimated at 10 mmHg.   Interatrial Septum: No atrial level shunt detected by color flow Doppler.   Pericardium: There is no evidence of pericardial effusion.   Mitral Valve: The mitral valve is grossly normal. Mild thickening of the  mitral valve leaflet. Mitral valve regurgitation is mild by color flow  Doppler.   Tricuspid Valve: The tricuspid valve is grossly normal. Tricuspid valve  regurgitation is mild by color flow Doppler.   Aortic Valve: The aortic valve is tricuspid Aortic valve regurgitation was  not visualized by color flow Doppler. There is no evidence of aortic valve  stenosis.   Pulmonic Valve: The pulmonic valve was grossly normal. Pulmonic valve  regurgitation is mild by color flow Doppler.   Aorta: The aorta is normal in  size and structure.   Venous: The inferior vena cava is normal in size with greater than 50%  respiratory variability.      NST 01/2019  There was no ST segment deviation noted during stress.  Findings consistent with prior inferior myocardial infarction. There is significant gut radiotracer uptake adjacent to this segment which may create some artifact, however there is hypokinesis of the inferior wall supporting a true prior infarct. There is no evidence of current ischemia.  The left ventricular ejection fraction is moderately decreased (32%).  This is a high risk study. Risk is based on decreased LVEF. Visually the LVEF looks better than 32%, may be possible gating issue. Recommend correlating with echo. No clear evidence of current myocardium at jeopardy       ASSESSMENT:    1. Coronary artery disease involving coronary bypass graft of native heart without angina pectoris   2. Essential hypertension   3. Hyperlipidemia, unspecified hyperlipidemia type   4. Tobacco abuse   5. Other chest pain      PLAN:  In order of problems listed above:  CAD status post CABG x5 03/2016 Lexiscan Myoview 02/27/2019 inferior MI no ischemia LVEF 32% but follow-up echo 03/04/2019 normal LV function EF 60 to 65% with mild to moderate concentric LVH.  Patient on Plavix. Ranexa, and atorvastatin. In ER last week and initial troponin  45 but others normal. EKG unchanged. Patient with chronic chest pain and asking for pain meds. Will refer to pain clinic. Do lexiscan to rule out ischemia and add Imdur for chest pain and HTN. Was previously on metoprolol 12.5 mg bid. Not sure why he's not on it anymore.   Essential hypertension BP very high. Refusing to take hydralazine 100 mg so will keep at 50 mg daily  Hyperlipidemia on atorvastatin  Tobacco abuse smoking cessation discussed.   Medication Adjustments/Labs and Tests Ordered: Current medicines are reviewed at length with the patient today.  Concerns  regarding medicines are outlined above.  Medication changes, Labs and Tests ordered today are listed in the Patient Instructions below. Patient Instructions  Medication Instructions:  Your physician recommends that you continue on your current medications as directed. Please refer to the Current Medication list given to you today.  *If you need a refill on your cardiac medications before your next appointment, please call your pharmacy*  Lab Work: NONE  If you have labs (blood work) drawn today and your tests are completely normal, you will receive your results only by: Marland Kitchen MyChart Message (if you have MyChart) OR . A paper copy in the mail If you have any lab test that is abnormal or we need to change your treatment, we will call you to review the results.  Testing/Procedures: Your physician has requested that you have a lexiscan myoview. For further information please visit [email or web address]. Please follow instruction sheet, as given.    Follow-Up: At [facility], you and your health needs are our priority.  As part of our continuing mission to provide you with exceptional heart care, we have created designated Provider Care Teams.  These Care Teams include your primary Cardiologist (physician) and Advanced Practice Providers (APPs -  Physician Assistants and Nurse Practitioners) who all work together to provide you with the care you need, when you need it.  Your next appointment:    Next available   The format for your next appointment:   In Person  Provider:   Kate Sable, MD  Other Instructions Thank you for choosing [facility]!       Sumner Boast, PA-C  09/09/2019 12:38 PM    [facility] [address] [address], [place], [place]  [address] Phone: [phone]; Fax: [phone]

## 2019-09-09 ENCOUNTER — Ambulatory Visit (INDEPENDENT_AMBULATORY_CARE_PROVIDER_SITE_OTHER): Payer: Medicaid Other | Admitting: Physician Assistant

## 2019-09-09 ENCOUNTER — Encounter: Payer: Self-pay | Admitting: *Deleted

## 2019-09-09 ENCOUNTER — Other Ambulatory Visit: Payer: Self-pay

## 2019-09-09 ENCOUNTER — Encounter: Payer: Self-pay | Admitting: Physician Assistant

## 2019-09-09 VITALS — BP 171/108 | HR 62 | Temp 99.6°F | Ht 67.0 in | Wt 155.0 lb

## 2019-09-09 DIAGNOSIS — I25810 Atherosclerosis of coronary artery bypass graft(s) without angina pectoris: Secondary | ICD-10-CM | POA: Diagnosis not present

## 2019-09-09 DIAGNOSIS — Z720 Tobacco use: Secondary | ICD-10-CM

## 2019-09-09 DIAGNOSIS — I10 Essential (primary) hypertension: Secondary | ICD-10-CM | POA: Diagnosis not present

## 2019-09-09 DIAGNOSIS — E785 Hyperlipidemia, unspecified: Secondary | ICD-10-CM

## 2019-09-09 DIAGNOSIS — R0789 Other chest pain: Secondary | ICD-10-CM

## 2019-09-09 MED ORDER — HYDRALAZINE HCL 50 MG PO TABS: 50.0000 mg | ORAL_TABLET | Freq: Every day | ORAL | 6 refills | Status: DC

## 2019-09-09 NOTE — Addendum Note (Signed)
Addended by: Levonne Hubert on: 09/09/2019 01:13 PM   Modules accepted: Orders

## 2019-09-09 NOTE — Patient Instructions (Signed)
Medication Instructions:  Your physician recommends that you continue on your current medications as directed. Please refer to the Current Medication list given to you today.  *If you need a refill on your cardiac medications before your next appointment, please call your pharmacy*  Lab Work: NONE  If you have labs (blood work) drawn today and your tests are completely normal, you will receive your results only by: Marland Kitchen MyChart Message (if you have MyChart) OR . A paper copy in the mail If you have any lab test that is abnormal or we need to change your treatment, we will call you to review the results.  Testing/Procedures: Your physician has requested that you have a lexiscan myoview. For further information please visit [email or web address]. Please follow instruction sheet, as given.    Follow-Up: At [facility], you and your health needs are our priority.  As part of our continuing mission to provide you with exceptional heart care, we have created designated Provider Care Teams.  These Care Teams include your primary Cardiologist (physician) and Advanced Practice Providers (APPs -  Physician Assistants and Nurse Practitioners) who all work together to provide you with the care you need, when you need it.  Your next appointment:    Next available   The format for your next appointment:   In Person  Provider:   Kate Sable, MD  Other Instructions Thank you for choosing [facility]!

## 2019-09-10 ENCOUNTER — Telehealth: Payer: Self-pay | Admitting: Cardiovascular Disease

## 2019-09-10 NOTE — Telephone Encounter (Signed)
Will inform Dr.Koneswaran

## 2019-09-10 NOTE — Telephone Encounter (Signed)
Signed        Pt called stating he can't take the hydrALAZINE (APRESOLINE) 50 MG tablet [id number]  Because it's making his heart race

## 2019-09-10 NOTE — Telephone Encounter (Signed)
Pt called stating he can't take the hydrALAZINE (APRESOLINE) 50 MG tablet [id number]  Because it's making his heart race

## 2019-09-11 ENCOUNTER — Other Ambulatory Visit: Payer: Self-pay | Admitting: Podiatry

## 2019-10-02 ENCOUNTER — Encounter (HOSPITAL_COMMUNITY)
Admission: RE | Admit: 2019-10-02 | Discharge: 2019-10-02 | Disposition: A | Discharge status: Home / Self Care | Payer: Medicaid Other | Source: Ambulatory Visit | Attending: Physician Assistant | Admitting: Physician Assistant

## 2019-10-02 ENCOUNTER — Other Ambulatory Visit: Payer: Self-pay

## 2019-10-02 ENCOUNTER — Encounter (HOSPITAL_BASED_OUTPATIENT_CLINIC_OR_DEPARTMENT_OTHER)
Admission: RE | Admit: 2019-10-02 | Discharge: 2019-10-02 | Disposition: A | Discharge status: Home / Self Care | Payer: Medicaid Other | Source: Ambulatory Visit | Attending: Physician Assistant | Admitting: Physician Assistant

## 2019-10-02 DIAGNOSIS — R0789 Other chest pain: Secondary | ICD-10-CM | POA: Diagnosis not present

## 2019-10-02 LAB — NM MYOCAR MULTI W/SPECT W/WALL MOTION / EF
LV dias vol: 124 mL (ref 62–150)
LV sys vol: 66 mL
Peak HR: 72 {beats}/min
RATE: 0.29
Rest HR: 57 {beats}/min
SDS: 0
SRS: 0
SSS: 0
TID: 1.25

## 2019-10-02 MED ORDER — REGADENOSON 0.4 MG/5ML IV SOLN
INTRAVENOUS | Status: AC
  Administered 2019-10-02: 0.4 mg via INTRAVENOUS
  Filled 2019-10-02: qty 5

## 2019-10-02 MED ORDER — TECHNETIUM TC 99M TETROFOSMIN IV KIT
10.0000 | PACK | Freq: Once | INTRAVENOUS | Status: AC | PRN
  Administered 2019-10-02: 10.98 via INTRAVENOUS

## 2019-10-02 MED ORDER — SODIUM CHLORIDE FLUSH 0.9 % IV SOLN
INTRAVENOUS | Status: AC
  Administered 2019-10-02: 10 mL via INTRAVENOUS
  Filled 2019-10-02: qty 10

## 2019-10-02 MED ORDER — TECHNETIUM TC 99M TETROFOSMIN IV KIT
30.0000 | PACK | Freq: Once | INTRAVENOUS | Status: AC | PRN
  Administered 2019-10-02: 33 via INTRAVENOUS

## 2019-10-07 ENCOUNTER — Other Ambulatory Visit: Payer: Self-pay | Admitting: "Endocrinology

## 2019-10-08 ENCOUNTER — Ambulatory Visit (INDEPENDENT_AMBULATORY_CARE_PROVIDER_SITE_OTHER): Payer: Medicaid Other | Admitting: "Endocrinology

## 2019-10-08 ENCOUNTER — Encounter: Payer: Self-pay | Admitting: Cardiovascular Disease

## 2019-10-08 ENCOUNTER — Ambulatory Visit (INDEPENDENT_AMBULATORY_CARE_PROVIDER_SITE_OTHER): Payer: Medicaid Other | Admitting: Cardiovascular Disease

## 2019-10-08 ENCOUNTER — Encounter: Payer: Self-pay | Admitting: "Endocrinology

## 2019-10-08 ENCOUNTER — Other Ambulatory Visit: Payer: Self-pay

## 2019-10-08 VITALS — BP 140/84 | HR 70 | Temp 98.7°F | Ht 67.0 in | Wt 156.0 lb

## 2019-10-08 VITALS — BP 156/97 | HR 72 | Ht 67.0 in | Wt 156.2 lb

## 2019-10-08 DIAGNOSIS — F17200 Nicotine dependence, unspecified, uncomplicated: Secondary | ICD-10-CM

## 2019-10-08 DIAGNOSIS — I25708 Atherosclerosis of coronary artery bypass graft(s), unspecified, with other forms of angina pectoris: Secondary | ICD-10-CM | POA: Diagnosis not present

## 2019-10-08 DIAGNOSIS — E1159 Type 2 diabetes mellitus with other circulatory complications: Secondary | ICD-10-CM | POA: Diagnosis not present

## 2019-10-08 DIAGNOSIS — E782 Mixed hyperlipidemia: Secondary | ICD-10-CM | POA: Diagnosis not present

## 2019-10-08 DIAGNOSIS — Z720 Tobacco use: Secondary | ICD-10-CM

## 2019-10-08 DIAGNOSIS — G8929 Other chronic pain: Secondary | ICD-10-CM

## 2019-10-08 DIAGNOSIS — I10 Essential (primary) hypertension: Secondary | ICD-10-CM | POA: Diagnosis not present

## 2019-10-08 DIAGNOSIS — E785 Hyperlipidemia, unspecified: Secondary | ICD-10-CM

## 2019-10-08 LAB — POCT GLYCOSYLATED HEMOGLOBIN (HGB A1C): Hemoglobin A1C: 7.4 % — AB (ref 4.0–5.6)

## 2019-10-08 MED ORDER — ACCU-CHEK GUIDE VI STRP: ORAL_STRIP | 1 refills | Status: DC

## 2019-10-08 NOTE — Progress Notes (Signed)
10/08/2019   Endocrinology follow-up note   Subjective:    Patient ID: Melvin Taylor, male    DOB: 1965/10/02, PCP Rosita Fire, MD   Past Medical History:  Diagnosis Date  . Anxiety   . Bipolar 1 disorder ([place])   . Bone spur    left heel  . Cervical radiculopathy   . Chest pain   . Chronic back pain   . Chronic chest wall pain   . Chronic neck pain   . Coronary artery disease    a. s/p CABG in 03/2016 with LIMA-LAD, SVG-D1, SVG-RCA, and Seq SVG-mid and distal OM  . Depression   . Diabetes mellitus    Type II  . Hallucinations    "long history of them"  . Headache(784.0)   . History of gout   . HTN (hypertension)   . Insomnia   . Myocardial infarction ([place]) 2017  . Neuropathy   . Pain management   . Polysubstance abuse ([place])   . Right leg pain    chronic  . Schizophrenia ([place])   . Seizures ([place])    last sz between July 5-9th, 2016; epilepsy  . Shortness of breath dyspnea    with exertion  . Sleep apnea   . Transfusion of blood product refused for religious reason    Past Surgical History:  Procedure Laterality Date  . ANTERIOR CERVICAL DECOMP/DISCECTOMY FUSION N/A 12/14/2015   Procedure: ANTERIOR CERVICAL DECOMPRESSION/DISCECTOMY FUSION CERVICAL FIVE -SIX;  Surgeon: Earnie Larsson, MD;  Location: [place];  Service: Neurosurgery;  Laterality: N/A;  . BIOPSY  05/07/2015   Procedure: BIOPSY (Gastric);  Surgeon: [name], MD;  Location: [facility];  Service: Endoscopy;;  . CARDIAC CATHETERIZATION N/A 03/07/2016   Procedure: Left Heart Cath and Coronary Angiography;  Surgeon: Belva Crome, MD;  Location: [facility];  Service: Cardiovascular;  Laterality: N/A;  . COLONOSCOPY WITH PROPOFOL N/A 05/07/2015   [id number] diverticulosis, multiple colon polyps removed, tubular adenoma, serrated colon polyp. Next colonoscopy October 2019  . COLONOSCOPY WITH PROPOFOL N/A 08/02/2018   Procedure: COLONOSCOPY WITH PROPOFOL;  Surgeon: [name], MD;  [facility];  Service: Endoscopy;  Laterality: N/A;  12:00pm  . CORONARY ARTERY BYPASS GRAFT N/A 03/28/2016   Procedure: CORONARY ARTERY BYPASS GRAFTING (CABG) x 5 USING GREATER SAPHENOUS VEIN;  Surgeon: [name], MD;  Location: [facility] OR;  Service: Open Heart Surgery;  Laterality: N/A;  . ENDOVEIN HARVEST OF GREATER SAPHENOUS VEIN Right 03/28/2016   Procedure: ENDOVEIN HARVEST OF GREATER SAPHENOUS VEIN;  Surgeon: [name], MD;  Location: [facility];  Service: Open Heart Surgery;  Laterality: Right;  . ESOPHAGEAL DILATION N/A 05/07/2015   Procedure: ESOPHAGEAL DILATION WITH 56FR MALONEY DILATOR;  Surgeon: [name], MD;  Location: [facility];  Service: Endoscopy;  Laterality: N/A;  . ESOPHAGOGASTRODUODENOSCOPY (EGD) WITH PROPOFOL N/A 05/07/2015   RMR: Status post dilation of normal esophagus. Gastritis.  Marland Kitchen KNEE SURGERY Left    arthroscopy  . MANDIBLE FRACTURE SURGERY    . POLYPECTOMY  05/07/2015   Procedure: POLYPECTOMY (Hepatic Flexure, Distal Transverse Colon, Rectal);  Surgeon: [name], MD;  Location: [facility];  Service: Endoscopy;;  . TEE WITHOUT CARDIOVERSION N/A 03/28/2016   Procedure: TRANSESOPHAGEAL ECHOCARDIOGRAM (TEE);  Surgeon: [name], MD;  Location: [facility];  Service: Open Heart Surgery;  Laterality: N/A;  . TONSILLECTOMY     Social History   Socioeconomic History  . Marital status: Single  Spouse name: Not on file  . Number of children: Not on file  . Years of education: 11th grade  . Highest education level: Not on file  Occupational History  . Occupation: unemployed    Fish farm manager: NOT EMPLOYED  Tobacco Use  . Smoking status: Current Every Day Smoker    Packs/day: 0.50    Years: 30.00    Pack years: 15.00    Types: Cigarettes  . Smokeless tobacco: Never Used  . Tobacco comment: 3 cigs per day  Substance and Sexual Activity  . Alcohol use: No  . Drug use: No  . Sexual activity: Not Currently  Other Topics Concern  . Not on file  Social History  Narrative   Lives alone   Drinks a cup of coffee a day   Social Determinants of Health   Financial Resource Strain:   . Difficulty of Paying Living Expenses: Not on file  Food Insecurity:   . Worried About Charity fundraiser in the Last Year: Not on file  . Ran Out of Food in the Last Year: Not on file  Transportation Needs:   . Lack of Transportation (Medical): Not on file  . Lack of Transportation (Non-Medical): Not on file  Physical Activity:   . Days of Exercise per Week: Not on file  . Minutes of Exercise per Session: Not on file  Stress:   . Feeling of Stress : Not on file  Social Connections:   . Frequency of Communication with Friends and Family: Not on file  . Frequency of Social Gatherings with Friends and Family: Not on file  . Attends Religious Services: Not on file  . Active Member of Clubs or Organizations: Not on file  . Attends Archivist Meetings: Not on file  . Marital Status: Not on file   Outpatient Encounter Medications as of 10/08/2019  Medication Sig  . albuterol (PROVENTIL HFA;VENTOLIN HFA) 108 (90 Base) MCG/ACT inhaler Inhale 2 puffs into the lungs every 4 (four) hours as needed for wheezing or shortness of breath.   Marland Kitchen amLODipine (NORVASC) 10 MG tablet Take 1 tablet (10 mg total) by mouth daily. Pt needs appt to continue to receive refills  . baclofen (LIORESAL) 10 MG tablet Take 10 mg by mouth 2 (two) times daily.  . clopidogrel (PLAVIX) 75 MG tablet Take 75 mg by mouth daily.   . diazepam (VALIUM) 5 MG tablet Take 5 mg by mouth 2 (two) times daily as needed for anxiety.   . divalproex (DEPAKOTE) 500 MG DR tablet Take 3 tablets (1,500 mg total) by mouth 2 (two) times daily.  Marland Kitchen escitalopram (LEXAPRO) 20 MG tablet Take 20 mg by mouth daily.   Marland Kitchen glucose blood (ACCU-CHEK GUIDE) test strip USE AS DIRECTED TO TEST 4 TIMES DAILY.  Marland Kitchen ibuprofen (ADVIL) 800 MG tablet Take 1 tablet (800 mg total) by mouth every 6 (six) hours as needed.  . insulin aspart  (NOVOLOG FLEXPEN) 100 UNIT/ML FlexPen Inject 5-11 Units into the skin 3 (three) times daily with meals.  . Insulin Glargine (LANTUS SOLOSTAR) 100 UNIT/ML Solostar Pen Inject 24 Units into the skin at bedtime. (Patient taking differently: Inject 26 Units into the skin at bedtime. )  . lamoTRIgine (LAMICTAL) 25 MG tablet Take 50 mg by mouth 2 (two) times daily.   Marland Kitchen lisinopril (ZESTRIL) 20 MG tablet Take 1 tablet (20 mg total) by mouth daily.  . Melatonin 10 MG CAPS Take 1 capsule by mouth at bedtime.  . nitroGLYCERIN (  NITROSTAT) 0.4 MG SL tablet Place 1 tablet (0.4 mg total) under the tongue every 5 (five) minutes as needed for chest pain.  . ranolazine (RANEXA) 500 MG 12 hr tablet TAKE (1) TABLET BY MOUTH TWICE DAILY. (Patient taking differently: Take 500 mg by mouth 2 (two) times daily. )  . risperiDONE (RISPERDAL) 3 MG tablet Take 3 mg by mouth at bedtime.  . [DISCONTINUED] glucose blood (ACCU-CHEK GUIDE) test strip USE AS DIRECTED TO TEST 4 TIMES DAILY.  . [DISCONTINUED] hydrALAZINE (APRESOLINE) 50 MG tablet Take 1 tablet (50 mg total) by mouth daily.  . [DISCONTINUED] insulin lispro (HUMALOG) 100 UNIT/ML injection Inject 2 Units into the skin 3 (three) times daily with meals. Inject 2 units three times daily before meals  . [DISCONTINUED] paliperidone (INVEGA) 6 MG 24 hr tablet Take 12 mg by mouth daily.   No facility-administered encounter medications on file as of 10/08/2019.   ALLERGIES: Allergies  Allergen Reactions  . Aspirin Swelling and Other (See Comments)    Facial swelling    VACCINATION STATUS: Immunization History  Administered Date(s) Administered  . Influenza,trivalent, recombinat, inj, PF 05/02/2015  . Tdap 01/18/2015    Diabetes He presents for his follow-up diabetic visit. He has type 2 diabetes mellitus. Onset time: He was diagnosed the approximate age of 58 years. His disease course has been improving. There are no hypoglycemic associated symptoms. Pertinent negatives  for hypoglycemia include no confusion, headaches, pallor or seizures. Pertinent negatives for diabetes include no chest pain, no fatigue, no polyphagia and no weakness. There are no hypoglycemic complications. Symptoms are improving. Diabetic complications include heart disease. (He is seriously noncompliant patient. He is now diagnosed with coronary artery [facility] since last visit. He is awaiting for his outpatient cardiology evaluation. This assay to be set for 03/24/2016.) Risk factors for coronary artery disease include diabetes mellitus, dyslipidemia, hypertension, male sex, tobacco exposure and sedentary lifestyle. Current diabetic treatment includes insulin injections. He is compliant with treatment none of the time. His weight is increasing steadily. He is following a generally unhealthy diet. When asked about meal planning, he reported none. He has had a previous visit with a dietitian. He never participates in exercise. His home blood glucose trend is fluctuating minimally. His breakfast blood glucose range is generally 140-180 mg/dl. His lunch blood glucose range is generally 140-180 mg/dl. His dinner blood glucose range is generally 140-180 mg/dl. His bedtime blood glucose range is generally 140-180 mg/dl. His overall blood glucose range is 140-180 mg/dl. (He is currently consistent on his Lantus 24 weeks nightly, not very consistent with NovoLog.  His point-of-care A1c 7.4%, improving from 9.7%.) An ACE inhibitor/angiotensin II receptor blocker is not being taken.  Hyperlipidemia This is a chronic problem. The current episode started more than 1 year ago. Exacerbating diseases include diabetes. Pertinent negatives include no chest pain, myalgias or shortness of breath. Current antihyperlipidemic treatment includes statins. Risk factors for coronary artery disease include diabetes mellitus, dyslipidemia, hypertension, a sedentary lifestyle and male sex.  Hypertension Pertinent  negatives include no chest pain, headaches, neck pain, palpitations or shortness of breath. Risk factors for coronary artery disease include smoking/tobacco exposure, diabetes mellitus, dyslipidemia and male gender.     Review of systems  Constitutional: + Minimally fluctuating body weight,  current  Body mass index is 24.46 kg/m. , no fatigue, no subjective hyperthermia, no subjective hypothermia Eyes: no blurry vision, no xerophthalmia ENT: no sore throat, no nodules palpated in throat, no dysphagia/odynophagia, no hoarseness  Cardiovascular: no Chest Pain, no Shortness of Breath, no palpitations, no leg swelling Respiratory: no cough, no shortness of breath Gastrointestinal: no Nausea/Vomiting/Diarhhea Musculoskeletal: no muscle/joint aches Skin: no rashes, no hyperemia Neurological: no tremors, no numbness, no tingling, no dizziness Psychiatric: no depression, no anxiety    Objective:    BP (!) 156/97   Pulse 72   Ht 5\' 7"  (1.702 m)   Wt 156 lb 3.2 oz (70.9 kg)   BMI 24.46 kg/m   Wt Readings from Last 3 Encounters:  10/08/19 156 lb (70.8 kg)  10/08/19 156 lb 3.2 oz (70.9 kg)  09/09/19 155 lb (70.3 kg)     Physical Exam- Limited  Constitutional:  Body mass index is 24.46 kg/m. , not in acute distress, normal state of mind Eyes:  EOMI, no exophthalmos Neck: Supple Thyroid: No gross goiter Respiratory: Adequate breathing efforts Musculoskeletal: no gross deformities, strength intact in all four extremities, no gross restriction of joint movements Skin:  no rashes, no hyperemia Neurological: no tremor with outstretched hands,    Chemistry (most recent): Lab Results  Component Value Date   NA 137 08/28/2019   K 3.7 08/28/2019   CL 103 08/28/2019   CO2 28 08/28/2019   BUN 15 08/28/2019   CREATININE 1.36 (H) 08/28/2019   Diabetic Labs (most recent): Lab Results  Component Value Date   HGBA1C 7.4 (A) 10/08/2019   HGBA1C 9.7 (A) 06/11/2019   HGBA1C >14.0  02/22/2019   Lipid Panel     Component Value Date/Time   CHOL 172 07/20/2018 1002   TRIG 93 07/20/2018 1002   HDL 78 07/20/2018 1002   CHOLHDL 2.0 03/22/2016 0853   VLDL 7 03/22/2016 0853   LDLCALC 75 07/20/2018 1002    Assessment & Plan:   1. Uncontrolled type 2 diabetes mellitus with complication, with long-term current use of insulin (HCC)  - diabetes is  complicated by Coronary artery disease , CKD , chronic heavy smoking, noncompliance and patient remains at a high risk for more acute and chronic complications of diabetes which include CAD, CVA, CKD, retinopathy, and neuropathy. These are all discussed in detail with the patient.  -He is returning with his logs showing average blood glucose between 101 150 milligrams per DL at fasting, less than 175 mg.  Postprandial times.  -His point-of-care A1c is 7.4%, progressively improving from 9.1%, and overall from  >14%.   -I had a long discussion with him about treatment course of type 2 diabetes in the pathology behind its complications. - I have re-counseled the patient on diet management   by adopting a carbohydrate restricted / protein rich  Diet.  - he  admits there is a room for improvement in his diet and drink choices. -  Suggestion is made for him to avoid simple carbohydrates  from his diet including Cakes, Sweet Desserts / Pastries, Ice Cream, Soda (diet and regular), Sweet Tea, Candies, Chips, Cookies, Sweet Pastries,  Store Bought Juices, Alcohol in Excess of  1-2 drinks a day, Artificial Sweeteners, Coffee Creamer, and "Sugar-free" Products. This will help patient to have stable blood glucose profile and potentially avoid unintended weight gain.  - Patient is advised to stick to a routine mealtimes to eat 3 meals  a day and avoid unnecessary snacks ( to snack only to correct hypoglycemia).   - I have approached patient with the following individualized plan to manage diabetes and patient reluctantly accepts .  - He is  urged to stay committed for proper  monitoring of blood glucose for safe use of insulin.    -He is advised to continue Lantus 24 units at bedtime,  continue NovoLog 5 units 3 times daily AC for pre-meal blood glucose above 90 mg,  Plus specific correction for readings above 150 mg per DL.    -He is urged to start and continue monitoring blood glucose 4 times a day-before meals and at bedtime.  - He is not a good candidate for incretin therapy nor metformin/SG LT 2 inhibitors . - Patient specific target  for A1c; LDL, HDL, Triglycerides, and  Waist Circumference were discussed in detail.  2) BP/HTN: His blood pressure is not controlled to target.  He has not been consistent taking his blood pressure medications, currently lisinopril 20 mg daily, amlodipine 10 mg daily.  He was supposed to be on hydrochlorothiazide 25 mg p.o. daily, not taking it for unclear reasons.  3)  Hyperlipidemia: His most recent lipid panel showed LDL at 75.  He will continue to benefit from statin intervention.  He is advised to continue atorvastatin 40 mg p.o. nightly.     4) Medical noncompliance: He has significant medical history of noncompliance, I advised him to stay focused on self-care.  5) Chronic Care/Health Maintenance:  -Patient  Is on Statin medications and encouraged to continue to follow up with Ophthalmology, Podiatrist at least yearly or according to recommendations, and advised to quit smoking. I have recommended yearly flu vaccine and pneumonia vaccination at least every 5 years; moderate intensity exercise for up to 150 minutes weekly; and  sleep for at least 7 hours a day.   He is advised to continue follow-up with his PMD for primary care needs. The patient was counseled on the dangers of tobacco use, and was advised to quit.  Reviewed strategies to maximize success, including removing cigarettes and smoking materials from environment.   He is advised to keep close follow-up with his PMD Dr.  Legrand Rams.  - Time spent on this patient care encounter:  35 min, of which > 50% was spent in  counseling and the rest reviewing his blood glucose logs , discussing his hypoglycemia and hyperglycemia episodes, reviewing his current and  previous labs / studies  ( including abstraction from other facilities) and medications  doses and developing a  long term treatment plan and documenting his care.   Please refer to Patient Instructions for Blood Glucose Monitoring and Insulin/Medications Dosing Guide"  in media tab for additional information. Please  also refer to " Patient Self Inventory" in the Media  tab for reviewed elements of pertinent patient history.  Lendon Ka Balin participated in the discussions, expressed understanding, and voiced agreement with the above plans.  All questions were answered to his satisfaction. he is encouraged to contact clinic should he have any questions or concerns prior to his return visit.   Follow up plan: -Return in about 3 months (around 01/08/2020) for Bring Meter and Logs- A1c in Office, Follow up with Pre-visit Labs.  Glade Lloyd, MD Phone: [phone]  Fax: [phone]   10/08/2019, 4:11 PM

## 2019-10-08 NOTE — Patient Instructions (Signed)
Medication Instructions:  Your physician recommends that you continue on your current medications as directed. Please refer to the Current Medication list given to you today.  *If you need a refill on your cardiac medications before your next appointment, please call your pharmacy*   Lab Work: none If you have labs (blood work) drawn today and your tests are completely normal, you will receive your results only by: Marland Kitchen MyChart Message (if you have MyChart) OR . A paper copy in the mail If you have any lab test that is abnormal or we need to change your treatment, we will call you to review the results.   Testing/Procedures: none   Follow-Up: At [facility], you and your health needs are our priority.  As part of our continuing mission to provide you with exceptional heart care, we have created designated Provider Care Teams.  These Care Teams include your primary Cardiologist (physician) and Advanced Practice Providers (APPs -  Physician Assistants and Nurse Practitioners) who all work together to provide you with the care you need, when you need it.  We recommend signing up for the patient portal called "MyChart".  Sign up information is provided on this After Visit Summary.  MyChart is used to connect with patients for Virtual Visits (Telemedicine).  Patients are able to view lab/test results, encounter notes, upcoming appointments, etc.  Non-urgent messages can be sent to your provider as well.   To learn more about what you can do with MyChart, go to [email or web address].    Your next appointment:   12 month(s)  The format for your next appointment:   In Person  Provider:   Kate Sable, MD   Other Instructions None       Thank you for choosing [facility] !

## 2019-10-08 NOTE — Patient Instructions (Signed)

## 2019-10-08 NOTE — Progress Notes (Signed)
SUBJECTIVE: The patient presents for follow-up of coronary artery disease. He underwent 5 vessel CABG on 03/28/16 (LIMA-LAD, SVG-D1, SVG-RCA, and Seq SVG-mid and distal OM).  Ever since bypass surgery, he has struggled with musculoskeletal chest wall and left arm pain. It is exacerbated with movement and lifting heavy objects.  He has had numerous ED visits for chest pain.  He underwent a normal nuclear stress test on 10/02/2019.  Echocardiogram performed on 03/04/2019 demonstrated normal LV systolic function, LVEF 60 to 65%.  There was mild to moderate concentric LVH.  He was evaluated in our office on 09/09/2019 and was prescribed Imdur.  He is not taking it.  He is also not taking atorvastatin any longer.  He was referred to the pain clinic.  He is doing better today.  He is a big [employer] [place] and [employer] and we talked about that.  Review of Systems: As per "subjective", otherwise negative.  Allergies  Allergen Reactions  . Aspirin Swelling and Other (See Comments)    Facial swelling     Current Outpatient Medications  Medication Sig Dispense Refill  . albuterol (PROVENTIL HFA;VENTOLIN HFA) 108 (90 Base) MCG/ACT inhaler Inhale 2 puffs into the lungs every 4 (four) hours as needed for wheezing or shortness of breath.     Marland Kitchen amLODipine (NORVASC) 10 MG tablet Take 1 tablet (10 mg total) by mouth daily. Pt needs appt to continue to receive refills 90 tablet 1  . baclofen (LIORESAL) 10 MG tablet Take 10 mg by mouth 2 (two) times daily.    . clopidogrel (PLAVIX) 75 MG tablet Take 75 mg by mouth daily.     . diazepam (VALIUM) 5 MG tablet Take 5 mg by mouth 2 (two) times daily as needed for anxiety.     . divalproex (DEPAKOTE) 500 MG DR tablet Take 3 tablets (1,500 mg total) by mouth 2 (two) times daily. 540 tablet 4  . escitalopram (LEXAPRO) 20 MG tablet Take 20 mg by mouth daily.     Marland Kitchen glucose blood (ACCU-CHEK GUIDE) test strip USE AS DIRECTED TO TEST 4 TIMES DAILY.  200 strip 1  . ibuprofen (ADVIL) 800 MG tablet Take 1 tablet (800 mg total) by mouth every 6 (six) hours as needed. 60 tablet 1  . insulin aspart (NOVOLOG FLEXPEN) 100 UNIT/ML FlexPen Inject 5-11 Units into the skin 3 (three) times daily with meals.    . Insulin Glargine (LANTUS SOLOSTAR) 100 UNIT/ML Solostar Pen Inject 24 Units into the skin at bedtime. (Patient taking differently: Inject 26 Units into the skin at bedtime. ) 5 pen 3  . lamoTRIgine (LAMICTAL) 25 MG tablet Take 50 mg by mouth 2 (two) times daily.     . Melatonin 10 MG CAPS Take 1 capsule by mouth at bedtime.    . ranolazine (RANEXA) 500 MG 12 hr tablet TAKE (1) TABLET BY MOUTH TWICE DAILY. (Patient taking differently: Take 500 mg by mouth 2 (two) times daily. ) 60 tablet 6  . risperiDONE (RISPERDAL) 3 MG tablet Take 3 mg by mouth at bedtime.    Marland Kitchen lisinopril (ZESTRIL) 20 MG tablet Take 1 tablet (20 mg total) by mouth daily. 90 tablet 3  . nitroGLYCERIN (NITROSTAT) 0.4 MG SL tablet Place 1 tablet (0.4 mg total) under the tongue every 5 (five) minutes as needed for chest pain. 25 tablet 3   No current facility-administered medications for this visit.    Past Medical History:  Diagnosis Date  . Anxiety   .  Bipolar 1 disorder ([place])   . Bone spur    left heel  . Cervical radiculopathy   . Chest pain   . Chronic back pain   . Chronic chest wall pain   . Chronic neck pain   . Coronary artery disease    a. s/p CABG in 03/2016 with LIMA-LAD, SVG-D1, SVG-RCA, and Seq SVG-mid and distal OM  . Depression   . Diabetes mellitus    Type II  . Hallucinations    "long history of them"  . Headache(784.0)   . History of gout   . HTN (hypertension)   . Insomnia   . Myocardial infarction ([place]) 2017  . Neuropathy   . Pain management   . Polysubstance abuse ([place])   . Right leg pain    chronic  . Schizophrenia ([place])   . Seizures ([place])    last sz between July 5-9th, 2016; epilepsy  . Shortness of breath dyspnea    with exertion    . Sleep apnea   . Transfusion of blood product refused for religious reason     Past Surgical History:  Procedure Laterality Date  . ANTERIOR CERVICAL DECOMP/DISCECTOMY FUSION N/A 12/14/2015   Procedure: ANTERIOR CERVICAL DECOMPRESSION/DISCECTOMY FUSION CERVICAL FIVE -SIX;  Surgeon: Earnie Larsson, MD;  Location: [facility];  Service: Neurosurgery;  Laterality: N/A;  . BIOPSY  05/07/2015   Procedure: BIOPSY (Gastric);  Surgeon: [name], MD;  Location: [facility];  Service: Endoscopy;;  . CARDIAC CATHETERIZATION N/A 03/07/2016   Procedure: Left Heart Cath and Coronary Angiography;  Surgeon: Belva Crome, MD;  Location: [facility];  Service: Cardiovascular;  Laterality: N/A;  . COLONOSCOPY WITH PROPOFOL N/A 05/07/2015   [id number] diverticulosis, multiple colon polyps removed, tubular adenoma, serrated colon polyp. Next colonoscopy October 2019  . COLONOSCOPY WITH PROPOFOL N/A 08/02/2018   Procedure: COLONOSCOPY WITH PROPOFOL;  Surgeon: [name], MD;  Location: [facility];  Service: Endoscopy;  Laterality: N/A;  12:00pm  . CORONARY ARTERY BYPASS GRAFT N/A 03/28/2016   Procedure: CORONARY ARTERY BYPASS GRAFTING (CABG) x 5 USING GREATER SAPHENOUS VEIN;  Surgeon: [name], MD;  Location: [facility];  Service: Open Heart Surgery;  Laterality: N/A;  . ENDOVEIN HARVEST OF GREATER SAPHENOUS VEIN Right 03/28/2016   Procedure: ENDOVEIN HARVEST OF GREATER SAPHENOUS VEIN;  Surgeon: [name], MD;  Location: [facility];  Service: Open Heart Surgery;  Laterality: Right;  . ESOPHAGEAL DILATION N/A 05/07/2015   Procedure: ESOPHAGEAL DILATION WITH 56FR MALONEY DILATOR;  Surgeon: [name], MD;  Location: [facility];  Service: Endoscopy;  Laterality: N/A;  . ESOPHAGOGASTRODUODENOSCOPY (EGD) WITH PROPOFOL N/A 05/07/2015   RMR: Status post dilation of normal esophagus. Gastritis.  Marland Kitchen KNEE SURGERY Left    arthroscopy  . MANDIBLE FRACTURE SURGERY    . POLYPECTOMY  05/07/2015   Procedure:  POLYPECTOMY (Hepatic Flexure, Distal Transverse Colon, Rectal);  Surgeon: [name], MD;  Location: [facility];  Service: Endoscopy;;  . TEE WITHOUT CARDIOVERSION N/A 03/28/2016   Procedure: TRANSESOPHAGEAL ECHOCARDIOGRAM (TEE);  Surgeon: [name], MD;  Location: [facility];  Service: Open Heart Surgery;  Laterality: N/A;  . TONSILLECTOMY      Social History   Socioeconomic History  . Marital status: Single    Spouse name: Not on file  . Number of children: Not on file  . Years of education: 11th grade  . Highest education level: Not on file  Occupational History  . Occupation:  unemployed    Employer: NOT EMPLOYED  Tobacco Use  . Smoking status: Current Every Day Smoker    Packs/day: 0.50    Years: 30.00    Pack years: 15.00    Types: Cigarettes  . Smokeless tobacco: Never Used  . Tobacco comment: 3 cigs per day  Substance and Sexual Activity  . Alcohol use: No  . Drug use: No  . Sexual activity: Not Currently  Other Topics Concern  . Not on file  Social History Narrative   Lives alone   Drinks a cup of coffee a day   Social Determinants of Health   Financial Resource Strain:   . Difficulty of Paying Living Expenses: Not on file  Food Insecurity:   . Worried About Charity fundraiser in the Last Year: Not on file  . Ran Out of Food in the Last Year: Not on file  Transportation Needs:   . Lack of Transportation (Medical): Not on file  . Lack of Transportation (Non-Medical): Not on file  Physical Activity:   . Days of Exercise per Week: Not on file  . Minutes of Exercise per Session: Not on file  Stress:   . Feeling of Stress : Not on file  Social Connections:   . Frequency of Communication with Friends and Family: Not on file  . Frequency of Social Gatherings with Friends and Family: Not on file  . Attends Religious Services: Not on file  . Active Member of Clubs or Organizations: Not on file  . Attends Archivist Meetings: Not on file  . Marital  Status: Not on file  Intimate Partner Violence:   . Fear of Current or Ex-Partner: Not on file  . Emotionally Abused: Not on file  . Physically Abused: Not on file  . Sexually Abused: Not on file     Vitals:   10/08/19 1556  BP: 140/84  Pulse: 70  Temp: 98.7 F (37.1 C)  SpO2: 98%  Weight: 156 lb (70.8 kg)  Height: 5\' 7"  (1.702 m)    Wt Readings from Last 3 Encounters:  10/08/19 156 lb (70.8 kg)  10/08/19 156 lb 3.2 oz (70.9 kg)  09/09/19 155 lb (70.3 kg)     PHYSICAL EXAM General: NAD HEENT: Normal. Neck: No JVD, no thyromegaly. Lungs: Clear to auscultation bilaterally with normal respiratory effort. CV: Regular rate and rhythm, normal S1/S2, no S3/S4, no murmur. No pretibial or periankle edema.  No carotid bruit.   Abdomen: Soft, nontender, no distention.  Neurologic: Alert and oriented.  Psych: Normal affect. Skin: Normal. Musculoskeletal: No gross deformities.      Labs: Lab Results  Component Value Date/Time   K 3.7 08/28/2019 09:16 AM   BUN 15 08/28/2019 09:16 AM   BUN 19 11/17/2016 10:17 AM   CREATININE 1.36 (H) 08/28/2019 09:16 AM   CREATININE 0.99 03/17/2016 02:36 PM   ALT 15 08/27/2019 05:35 PM   TSH 0.58 10/19/2015 10:06 AM   HGB 13.4 08/28/2019 09:16 AM     Lipids: Lab Results  Component Value Date/Time   LDLCALC 75 07/20/2018 10:02 AM   CHOL 172 07/20/2018 10:02 AM   TRIG 93 07/20/2018 10:02 AM   HDL 78 07/20/2018 10:02 AM       ASSESSMENT AND PLAN:  1.Coronary artery disease: S/p 5-v CABG 03/2016.Symptomatically stable.Lexiscan Myoview on 10/02/2019 was normal.   Echocardiogram performed on 03/04/2019 demonstrated normal LV systolic function, LVEF 60 to 65%.  There was mild to moderate concentric LVH. Continue  Plavix (allergic to ASA)andRanexa 500 mg bid.  He is no longer on metoprolol nor Imdur which was prescribed in early February.  2. Hypertension:BP is borderline elevated.  No change to therapy.  3. Hyperlipidemia:No  longer on atorvastatin 40 mg.   4. Tobacco use: Smokes less than a half pack of cigarettes daily.  5.  Chronic pain: Referred to the pain clinic on 09/09/2019.   Disposition: Follow up 1 year   Kate Sable, M.D., F.A.C.C.

## 2019-10-10 ENCOUNTER — Other Ambulatory Visit: Payer: Self-pay | Admitting: Cardiovascular Disease

## 2019-10-31 ENCOUNTER — Ambulatory Visit: Payer: Medicaid Other

## 2019-11-01 ENCOUNTER — Ambulatory Visit: Payer: Medicaid Other | Attending: Internal Medicine

## 2019-11-01 DIAGNOSIS — Z23 Encounter for immunization: Secondary | ICD-10-CM

## 2019-11-01 NOTE — Progress Notes (Signed)
   Covid-19 Vaccination Clinic  Name:  Melvin Taylor    MRN: [id number] DOB: 01-21-1966  11/01/2019  Mr. Kagan was observed post Covid-19 immunization for 30 minutes based on pre-vaccination screening without incident. He was provided with Vaccine Information Sheet and instruction to access the V-Safe system.   Mr. Clougherty was instructed to call 911 with any severe reactions post vaccine: Marland Kitchen Difficulty breathing  . Swelling of face and throat  . A fast heartbeat  . A bad rash all over body  . Dizziness and weakness   Immunizations Administered    Name Date Dose VIS Date Route   Moderna COVID-19 Vaccine 11/01/2019 11:18 AM 0.5 mL 07/02/2019 Intramuscular   Manufacturer: Moderna   Lot: [id number]   [id number]

## 2019-11-06 ENCOUNTER — Ambulatory Visit: Payer: Medicaid Other | Admitting: Family Medicine

## 2019-12-03 ENCOUNTER — Ambulatory Visit: Payer: Medicaid Other | Attending: Internal Medicine

## 2019-12-03 DIAGNOSIS — Z23 Encounter for immunization: Secondary | ICD-10-CM

## 2019-12-03 NOTE — Progress Notes (Signed)
   Covid-19 Vaccination Clinic  Name:  Melvin Taylor    MRN: [id number] DOB: [birth year]  12/03/2019  Mr. Luevano was observed post Covid-19 immunization for 15 minutes without incident. He was provided with Vaccine Information Sheet and instruction to access the V-Safe system.   Mr. Souder was instructed to call 911 with any severe reactions post vaccine: Marland Kitchen Difficulty breathing  . Swelling of face and throat  . A fast heartbeat  . A bad rash all over body  . Dizziness and weakness   Immunizations Administered    Name Date Dose VIS Date Route   Moderna COVID-19 Vaccine 12/03/2019 10:59 AM 0.5 mL 07/2019 Intramuscular   Manufacturer: Moderna   Lot: [id number]   [id number]

## 2020-01-10 ENCOUNTER — Ambulatory Visit: Payer: Medicaid Other | Admitting: "Endocrinology

## 2020-01-14 ENCOUNTER — Emergency Department (HOSPITAL_COMMUNITY)
Admission: EM | Admit: 2020-01-14 | Discharge: 2020-01-14 | Disposition: A | Discharge status: Home / Self Care | Payer: Medicaid Other | Attending: Emergency Medicine | Admitting: Emergency Medicine

## 2020-01-14 ENCOUNTER — Emergency Department (HOSPITAL_COMMUNITY): Payer: Medicaid Other

## 2020-01-14 ENCOUNTER — Encounter (HOSPITAL_COMMUNITY): Payer: Self-pay | Admitting: Emergency Medicine

## 2020-01-14 ENCOUNTER — Other Ambulatory Visit: Payer: Self-pay

## 2020-01-14 DIAGNOSIS — R079 Chest pain, unspecified: Secondary | ICD-10-CM

## 2020-01-14 DIAGNOSIS — Z96651 Presence of right artificial knee joint: Secondary | ICD-10-CM | POA: Diagnosis not present

## 2020-01-14 DIAGNOSIS — Z794 Long term (current) use of insulin: Secondary | ICD-10-CM | POA: Insufficient documentation

## 2020-01-14 DIAGNOSIS — I252 Old myocardial infarction: Secondary | ICD-10-CM | POA: Insufficient documentation

## 2020-01-14 DIAGNOSIS — Z951 Presence of aortocoronary bypass graft: Secondary | ICD-10-CM | POA: Insufficient documentation

## 2020-01-14 DIAGNOSIS — Z79899 Other long term (current) drug therapy: Secondary | ICD-10-CM | POA: Diagnosis not present

## 2020-01-14 DIAGNOSIS — Z981 Arthrodesis status: Secondary | ICD-10-CM | POA: Insufficient documentation

## 2020-01-14 DIAGNOSIS — Z79891 Long term (current) use of opiate analgesic: Secondary | ICD-10-CM | POA: Insufficient documentation

## 2020-01-14 DIAGNOSIS — Z86010 Personal history of colonic polyps: Secondary | ICD-10-CM | POA: Diagnosis not present

## 2020-01-14 DIAGNOSIS — R110 Nausea: Secondary | ICD-10-CM | POA: Diagnosis not present

## 2020-01-14 DIAGNOSIS — I10 Essential (primary) hypertension: Secondary | ICD-10-CM | POA: Diagnosis not present

## 2020-01-14 DIAGNOSIS — Z7951 Long term (current) use of inhaled steroids: Secondary | ICD-10-CM | POA: Diagnosis not present

## 2020-01-14 DIAGNOSIS — R0789 Other chest pain: Secondary | ICD-10-CM | POA: Diagnosis not present

## 2020-01-14 DIAGNOSIS — E119 Type 2 diabetes mellitus without complications: Secondary | ICD-10-CM | POA: Diagnosis not present

## 2020-01-14 DIAGNOSIS — I2510 Atherosclerotic heart disease of native coronary artery without angina pectoris: Secondary | ICD-10-CM | POA: Diagnosis not present

## 2020-01-14 DIAGNOSIS — R072 Precordial pain: Secondary | ICD-10-CM | POA: Diagnosis present

## 2020-01-14 LAB — CBC
HCT: 39.8 % (ref 39.0–52.0)
Hemoglobin: 13.9 g/dL (ref 13.0–17.0)
MCH: 31.2 pg (ref 26.0–34.0)
MCHC: 34.9 g/dL (ref 30.0–36.0)
MCV: 89.2 fL (ref 80.0–100.0)
Platelets: 200 10*3/uL (ref 150–400)
RBC: 4.46 MIL/uL (ref 4.22–5.81)
RDW: 12.4 % (ref 11.5–15.5)
WBC: 5.6 10*3/uL (ref 4.0–10.5)
nRBC: 0 % (ref 0.0–0.2)

## 2020-01-14 LAB — BASIC METABOLIC PANEL
Anion gap: 6 (ref 5–15)
BUN: 19 mg/dL (ref 6–20)
CO2: 27 mmol/L (ref 22–32)
Calcium: 8.4 mg/dL — ABNORMAL LOW (ref 8.9–10.3)
Chloride: 104 mmol/L (ref 98–111)
Creatinine, Ser: 1.37 mg/dL — ABNORMAL HIGH (ref 0.61–1.24)
GFR calc Af Amer: >60 mL/min (ref >60)
GFR calc non Af Amer: 58 mL/min — ABNORMAL LOW (ref >60)
Glucose, Bld: 242 mg/dL — ABNORMAL HIGH (ref 70–99)
Potassium: 4.7 mmol/L (ref 3.5–5.1)
Sodium: 137 mmol/L (ref 135–145)

## 2020-01-14 LAB — TROPONIN I (HIGH SENSITIVITY)
Troponin I (High Sensitivity): 5 ng/L (ref <18)
Troponin I (High Sensitivity): 6 ng/L (ref <18)

## 2020-01-14 MED ORDER — KETOROLAC TROMETHAMINE 30 MG/ML IJ SOLN
30.0000 mg | Freq: Once | INTRAMUSCULAR | Status: AC
  Administered 2020-01-14: 30 mg via INTRAVENOUS
  Filled 2020-01-14: qty 1

## 2020-01-14 MED ORDER — ALUM & MAG HYDROXIDE-SIMETH 200-200-20 MG/5ML PO SUSP
30.0000 mL | Freq: Once | ORAL | Status: AC
  Administered 2020-01-14: 30 mL via ORAL
  Filled 2020-01-14: qty 30

## 2020-01-14 NOTE — ED Triage Notes (Signed)
Patient arrives via EMS from home with c/o chest pain. Pain is mid chest, sharp in nature, and worsens with cough, deep breathing, and movement. Patient with h/o CAD and MI. Patient took nitro at home without change in pain. EMS gave 1 SL nitro that did not change his pain. Patient alert/oriented.

## 2020-01-14 NOTE — ED Provider Notes (Signed)
[facility] EMERGENCY DEPARTMENT Provider Note   CSN: [id number] Arrival date & time: 01/14/20  1441     History Chief Complaint  Patient presents with  . Chest Pain    Melvin Taylor is a 54 y.o. male with PMH significant for CAD s/p CABG in 03/2016, IDDM, HTN, and schizophrenia who presents to the ED via EMS with complaints of chest pain.  Chest pain is centrally located, sharp, and worse with cough/deep inspiration/certain movements.  EMS provide patient with 1 SL nitroglycerin which failed to improve discomfort.  Patient states that his pain is 7 out of 10, substernal, began at 9 AM with associated nausea.  He does not believe that it feels exactly like his prior heart attack, but states that the sharp sensation is consistent.  He endorses a 15-pack-year smoking history is making an effort to quit at this time.  He felt perfectly fine yesterday and did not have any significant symptoms of which to report.  He denies any recent illness, fevers or chills, cough worse than usual, abdominal pain, vomiting, diaphoresis, or other symptoms.  HPI     Past Medical History:  Diagnosis Date  . Anxiety   . Bipolar 1 disorder ([place])   . Bone spur    left heel  . Cervical radiculopathy   . Chest pain   . Chronic back pain   . Chronic chest wall pain   . Chronic neck pain   . Coronary artery disease    a. s/p CABG in 03/2016 with LIMA-LAD, SVG-D1, SVG-RCA, and Seq SVG-mid and distal OM  . Depression   . Diabetes mellitus    Type II  . Hallucinations    "long history of them"  . Headache(784.0)   . History of gout   . HTN (hypertension)   . Insomnia   . Myocardial infarction ([place]) 2017  . Neuropathy   . Pain management   . Polysubstance abuse ([place])   . Right leg pain    chronic  . Schizophrenia ([place])   . Seizures ([place])    last sz between July 5-9th, 2016; epilepsy  . Shortness of breath dyspnea    with exertion  . Sleep apnea   . Transfusion of blood product refused for  religious reason     Patient Active Problem List   Diagnosis Date Noted  . Current smoker 06/21/2019  . Seizure-like activity ([place]) 05/06/2019  . Type 2 diabetes mellitus with stage 3 chronic kidney disease, with long-term current use of insulin ([place]) 04/11/2019  . Anemia, chronic disease 07/04/2018  . Other specified diseases of the digestive system 10/30/2017  . CAD (coronary artery disease) 03/28/2016  . Chest pain 03/21/2016  . Mixed hyperlipidemia 03/11/2016  . Ischemic chest pain ([place])   . NSTEMI (non-ST elevated myocardial infarction) ([place])   . Coronary atherosclerosis of native coronary artery 03/03/2016  . Cervical spinal stenosis 12/14/2015  . Loss of weight 08/25/2015  . Essential hypertension, benign 07/23/2015  . Personal history of noncompliance with medical treatment, presenting hazards to health 07/23/2015  . Abdominal pain 06/08/2015  . Constipation 06/08/2015  . History of colonic polyps   . Diverticulosis of colon without hemorrhage   . Mucosal abnormality of stomach   . Encounter for screening colonoscopy 04/16/2015  . Dysphagia 04/16/2015  . Partial symptomatic epilepsy with complex partial seizures, intractable, without status epilepticus ([place]) 11/10/2014  . Bipolar disorder, unspecified ([place]) 10/15/2014  . Schizophrenia ([place]) 10/15/2014  . DM type 2 causing vascular  disease ([place]) 06/29/2014  . Musculoskeletal pain 06/29/2014  . Hyperglycemia without ketosis 09/07/2013  . 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 Other internal derangements of unspecified knee 01/13/2011  . Knee pain 12/28/2010  . Medial meniscus, posterior horn derangement 12/28/2010    Past Surgical History:  Procedure Laterality Date  . ANTERIOR CERVICAL DECOMP/DISCECTOMY FUSION N/A 12/14/2015   Procedure: ANTERIOR CERVICAL DECOMPRESSION/DISCECTOMY FUSION CERVICAL FIVE -SIX;  Surgeon: Earnie Larsson, MD;  Location: [facility];  Service: Neurosurgery;  Laterality: N/A;  .  BIOPSY  05/07/2015   Procedure: BIOPSY (Gastric);  Surgeon: [name], MD;  Location: [facility];  Service: Endoscopy;;  . CARDIAC CATHETERIZATION N/A 03/07/2016   Procedure: Left Heart Cath and Coronary Angiography;  Surgeon: Belva Crome, MD;  Location: [place] [facility] [facility];  Service: Cardiovascular;  Laterality: N/A;  . COLONOSCOPY WITH PROPOFOL N/A 05/07/2015   GHW:EXHBZJIRCV diverticulosis, multiple colon polyps removed, tubular adenoma, serrated colon polyp. Next colonoscopy October 2019  . COLONOSCOPY WITH PROPOFOL N/A 08/02/2018   Procedure: COLONOSCOPY WITH PROPOFOL;  Surgeon: [name], MD;  Location: [facility];  Service: Endoscopy;  Laterality: N/A;  12:00pm  . CORONARY ARTERY BYPASS GRAFT N/A 03/28/2016   Procedure: CORONARY ARTERY BYPASS GRAFTING (CABG) x 5 USING GREATER SAPHENOUS VEIN;  Surgeon: [name], MD;  Location: [place] [facility] OR;  Service: Open Heart Surgery;  Laterality: N/A;  . ENDOVEIN HARVEST OF GREATER SAPHENOUS VEIN Right 03/28/2016   Procedure: ENDOVEIN HARVEST OF GREATER SAPHENOUS VEIN;  Surgeon: [name], MD;  Location: [facility];  Service: Open Heart Surgery;  Laterality: Right;  . ESOPHAGEAL DILATION N/A 05/07/2015   Procedure: ESOPHAGEAL DILATION WITH 56FR MALONEY DILATOR;  Surgeon: [name], MD;  Location: [facility];  Service: Endoscopy;  Laterality: N/A;  . ESOPHAGOGASTRODUODENOSCOPY (EGD) WITH PROPOFOL N/A 05/07/2015   RMR: Status post dilation of normal esophagus. Gastritis.  Marland Kitchen KNEE SURGERY Left    arthroscopy  . MANDIBLE FRACTURE SURGERY    . POLYPECTOMY  05/07/2015   Procedure: POLYPECTOMY (Hepatic Flexure, Distal Transverse Colon, Rectal);  Surgeon: [name], MD;  Location: [facility];  Service: Endoscopy;;  . TEE WITHOUT CARDIOVERSION N/A 03/28/2016   Procedure: TRANSESOPHAGEAL ECHOCARDIOGRAM (TEE);  Surgeon: [name], MD;  Location: [facility];  Service: Open Heart Surgery;  Laterality: N/A;  . TONSILLECTOMY         Family History  Problem  Relation Age of Onset  . Diabetes Father   . Hypertension Father   . Arthritis Other   . Diabetes Other   . Asthma Other   . Stroke Sister     Social History   Tobacco Use  . Smoking status: Current Every Day Smoker    Packs/day: 0.50    Years: 30.00    Pack years: 15.00    Types: Cigarettes  . Smokeless tobacco: Never Used  . Tobacco comment: 3 cigs per day  Vaping Use  . Vaping Use: Never used  Substance Use Topics  . Alcohol use: No  . Drug use: No    Home Medications Prior to Admission medications   Medication Sig Start Date End Date Taking? Authorizing Provider  albuterol (PROVENTIL HFA;VENTOLIN HFA) 108 (90 Base) MCG/ACT inhaler Inhale 2 puffs into the lungs every 4 (four) hours as needed for wheezing or shortness of breath.    Yes [provider]  amLODipine (NORVASC) 10 MG tablet TAKE 1 TABLET BY MOUTH ONCE DAILY. Patient taking differently: Take 10  mg by mouth daily.  10/10/19  Yes Herminio Commons, MD  diazepam (VALIUM) 5 MG tablet Take 5 mg by mouth 2 (two) times daily as needed for anxiety.    Yes [provider]  divalproex (DEPAKOTE) 500 MG DR tablet Take 3 tablets (1,500 mg total) by mouth 2 (two) times daily. 05/07/19  Yes Penumalli, Vikram R, MD  escitalopram (LEXAPRO) 20 MG tablet Take 20 mg by mouth daily.    Yes [provider]  Insulin Glargine (LANTUS SOLOSTAR) 100 UNIT/ML Solostar Pen Inject 24 Units into the skin at bedtime. Patient taking differently: Inject 15 Units into the skin at bedtime.  06/21/19  Yes Cassandria Anger, MD  lamoTRIgine (LAMICTAL) 25 MG tablet Take 50 mg by mouth 2 (two) times daily.  05/20/19  Yes [provider]  lisinopril (ZESTRIL) 20 MG tablet Take 1 tablet (20 mg total) by mouth daily. 06/26/19 01/14/20 Yes Herminio Commons, MD  Melatonin 10 MG CAPS Take 1 capsule by mouth at bedtime as needed (for sleep).  06/12/19  Yes [provider]  nitroGLYCERIN (NITROSTAT) 0.4 MG  SL tablet Place 1 tablet (0.4 mg total) under the tongue every 5 (five) minutes as needed for chest pain. 02/20/19 01/14/20 Yes Strader, Fransisco Hertz, PA-C  prazosin (MINIPRESS) 2 MG capsule Take 2-4 mg by mouth at bedtime as needed (for sleep).  12/03/19  Yes [provider]  ranolazine (RANEXA) 500 MG 12 hr tablet TAKE (1) TABLET BY MOUTH TWICE DAILY. Patient taking differently: Take 500 mg by mouth 2 (two) times daily.  08/16/19  Yes Herminio Commons, MD  risperiDONE (RISPERDAL) 3 MG tablet Take 3 mg by mouth at bedtime. 08/16/19  Yes [provider]  paliperidone (INVEGA) 6 MG 24 hr tablet Take 12 mg by mouth daily.  02/12/19  [provider]    Allergies    Aspirin  Review of Systems   Review of Systems  Constitutional: Negative for chills and fever.  Respiratory: Positive for cough. Negative for shortness of breath and wheezing.   Cardiovascular: Positive for chest pain. Negative for palpitations and leg swelling.  Gastrointestinal: Positive for nausea. Negative for abdominal pain and vomiting.  All other systems reviewed and are negative.   Physical Exam Updated Vital Signs BP (!) 160/96 (BP Location: Left Arm)   Pulse 60   Temp 100.1 F (37.8 C) (Oral)   Resp 18   Ht 5\' 7"  (1.702 m)   Wt 69.9 kg   SpO2 100%   BMI 24.12 kg/m   Physical Exam Vitals and nursing note reviewed. Exam conducted with a chaperone present.  HENT:     Head: Normocephalic and atraumatic.  Eyes:     General: No scleral icterus.    Conjunctiva/sclera: Conjunctivae normal.  Cardiovascular:     Rate and Rhythm: Normal rate and regular rhythm.     Pulses: Normal pulses.     Heart sounds: Normal heart sounds.  Pulmonary:     Effort: Pulmonary effort is normal. No respiratory distress.     Breath sounds: Normal breath sounds.  Abdominal:     General: Abdomen is flat. There is no distension.     Palpations: Abdomen is soft. There is no mass.     Tenderness: There is no  abdominal tenderness.  Musculoskeletal:     Cervical back: Normal range of motion. No rigidity.  Skin:    General: Skin is dry.     Capillary Refill: Capillary refill takes less  than 2 seconds.  Neurological:     Mental Status: He is alert and oriented to person, place, and time.     GCS: GCS eye subscore is 4. GCS verbal subscore is 5. GCS motor subscore is 6.  Psychiatric:        Mood and Affect: Mood normal.        Behavior: Behavior normal.        Thought Content: Thought content normal.     ED Results / Procedures / Treatments   Labs (all labs ordered are listed, but only abnormal results are displayed) Labs Reviewed  BASIC METABOLIC PANEL - Abnormal; Notable for the following components:      Result Value   Glucose, Bld 242 (*)    Creatinine, Ser 1.37 (*)    Calcium 8.4 (*)    GFR calc non Af Amer 58 (*)    All other components within normal limits  CBC  TROPONIN I (HIGH SENSITIVITY)  TROPONIN I (HIGH SENSITIVITY)    EKG None  Radiology DG Chest 2 View  Result Date: 01/14/2020 CLINICAL DATA:  Chest pain and cough.  Seizures.  Hypertension. EXAM: CHEST - 2 VIEW COMPARISON:  08/28/2019 FINDINGS: Mild hyperinflation. Median sternotomy for CABG. Midline trachea. Normal heart size and mediastinal contours. No pleural effusion or pneumothorax. Clear lungs. IMPRESSION: Hyperinflation, without acute disease. Electronically Signed   By: Abigail Miyamoto M.D.   On: 01/14/2020 15:45    Procedures Procedures (including critical care time)  Medications Ordered in ED Medications  ketorolac (TORADOL) 30 MG/ML injection 30 mg (has no administration in time range)  alum & mag hydroxide-simeth (MAALOX/MYLANTA) 200-200-20 MG/5ML suspension 30 mL (30 mLs Oral Given 01/14/20 1553)    ED Course  I have reviewed the triage vital signs and the nursing notes.  Pertinent labs & imaging results that were available during my care of the patient were reviewed by me and considered in my  medical decision making (see chart for details).    MDM Rules/Calculators/A&P                          Given patient's history of MI and CABG performed 03/28/2016, obviously concern for ACS.  However, patient states that the symptoms came on at rest and feel a bit different from his prior MI.  His symptoms were unrelieved/unimproved by nitroglycerin and he denies any associated shortness of breath symptoms.  He did endorse some diaphoresis, will obtain basic chest pain work-up.  Given that his chest discomfort is reproducible with palpation on exam as well as with deep inspiration and certain movements, suspect possible musculoskeletal etiology such as costochondritis due to his smoker's cough.  On reexamination, patient is feeling improved after Maalox.  However, he is asking for some additional pain medication.  His history and physical exam is consistent with a musculoskeletal discomfort, possible costochondritis.  His laboratory work-up is entirely reassuring.  CKD consistent with baseline.  DG chest obtained demonstrates no acute cardiopulmonary findings and his troponin was trended x2 and WNL.  He is resting comfortably and in no acute distress.  He feels prepared for discharge, but does admit that he would like narcotic medication.  He has a follow-up appointment with his primary care provider in 2 days.  He states that he has been having chronic pain for years and that he has been unable to get placed in chronic pain clinic.  He states that he had one that he had  called in [place], [place], however they never called him back.  I encouraged him to follow-up with his primary care provider regarding this issue at his upcoming appointment.  Patient given Toradol IV here in the ED for additional pain relief.  Strict ED return precautions discussed.  All of the evaluation and work-up results were discussed with the patient and any family at bedside. They were provided opportunity to ask any additional  questions and have none at this time. They have expressed understanding of verbal discharge instructions as well as return precautions and are agreeable to the plan.    Final Clinical Impression(s) / ED Diagnoses Final diagnoses:  Nonspecific chest pain    Rx / DC Orders ED Discharge Orders    None       Corena Herter, PA-C 01/14/20 1913    Varney Biles, MD 01/17/20 1456

## 2020-01-14 NOTE — Discharge Instructions (Signed)
Your history and physical exam is consistent with possible costochondritis.  Your laboratory work-up was entirely reassuring.  Please go to your appointment with your primary care provider in 2 days, as scheduled.  I would like for you to follow-up with them regarding your inquiry to be established with a chronic pain clinic.  They should be able to assist you in that endeavor.  Return to the ED or seek immediate medical attention should you experience any new or worsening symptoms.

## 2020-01-15 ENCOUNTER — Other Ambulatory Visit: Payer: Self-pay

## 2020-01-15 MED ORDER — NITROGLYCERIN 0.4 MG SL SUBL: 0.4000 mg | SUBLINGUAL_TABLET | SUBLINGUAL | 3 refills | Status: DC | PRN

## 2020-01-19 NOTE — Progress Notes (Signed)
Cardiology Office Note  Date: 01/20/2020   ID: Melvin Taylor, DOB Aug 23, 1965, MRN [id number]  PCP:  [facility]  Cardiologist:  Kate Sable, MD Electrophysiologist:  None   Chief Complaint: Follow-up CAD  History of Present Illness: Melvin Taylor is a 54 y.o. male with a history of CAD (CABG x5 03/28/2016 LIMA-LAD, SVG-D1, SVG-RCA, sequential SVG-mid and distal OM)  Last saw Dr. Bronson Ing 10/08/2019.  Since bypass surgery patient has been struggling with musculoskeletal chest wall and left arm pain exacerbated by movement and lifting heavy objects.  Numerous ED visits for chest pain, nuclear stress test normal on 10/02/2019, echocardiogram 02/06/4695 normal LV systolic function with EF 60 to 65%, mild to moderate concentric LVH.  Been seen previously in the office on 09/09/2019 and was prescribed Imdur but was not taking it.  He was also not taking atorvastatin.  He was referred to  pain clinic.  Recent presentation to [facility] emergency department via EMS with complaints of chest pain.  Described the chest pain is centrally located, sharp, worse with cough/deep inspiration, certain movements.  Given 1 sublingual nitroglycerin by EMS which failed to improve discomfort.  He stated his chest pain was a 7 out of 10 in severity beginning approximately 9 cm that day with associated nausea.  He stated he felt perfectly fine the previous day and did not have any significant symptoms. His lab work up was negative with Trop x 2 neg. CXR negative. Symptoms were relieved with Maalox. Patient requested narcotic pain medication per provider. Patient stated he had been having chronic pain and had been unable to be placed with a pain clinic. He was given IV Toradol for pain and discharged later.  Patient presents today for follow-up status post recent ED visit for chest pain.  He continues to complain of chest pain on exertion but denies any radiation to neck arm back or jaw.  Denies any  nausea, vomiting, or diaphoresis during exertion.  Continues to smoke.  States he has been trying to get a referral to pain clinic for chronic pain management.  He goes to the [facility]. Denies any current resting chest pain.  States he smoked a cigarette approximately 30 minutes prior to arriving which may be the reason his blood pressure is elevated.  Having problems with diabetes control.  Recent glucose levels during ER visit 243 with a creatinine of 1.37 and GFR of greater than 60.  Past Medical History:  Diagnosis Date  . Anxiety   . Bipolar 1 disorder ([place])   . Bone spur    left heel  . Cervical radiculopathy   . Chest pain   . Chronic back pain   . Chronic chest wall pain   . Chronic neck pain   . Coronary artery disease    a. s/p CABG in 03/2016 with LIMA-LAD, SVG-D1, SVG-RCA, and Seq SVG-mid and distal OM  . Depression   . Diabetes mellitus    Type II  . Hallucinations    "long history of them"  . Headache(784.0)   . History of gout   . HTN (hypertension)   . Insomnia   . Myocardial infarction ([place]) 2017  . Neuropathy   . Pain management   . Polysubstance abuse ([place])   . Right leg pain    chronic  . Schizophrenia ([place])   . Seizures ([place])    last sz between July 5-9th, 2016; epilepsy  . Shortness of breath dyspnea  with exertion  . Sleep apnea   . Transfusion of blood product refused for religious reason     Past Surgical History:  Procedure Laterality Date  . ANTERIOR CERVICAL DECOMP/DISCECTOMY FUSION N/A 12/14/2015   Procedure: ANTERIOR CERVICAL DECOMPRESSION/DISCECTOMY FUSION CERVICAL FIVE -SIX;  Surgeon: Earnie Larsson, MD;  Location: [facility] NEURO ORS;  Service: Neurosurgery;  Laterality: N/A;  . BIOPSY  05/07/2015   Procedure: BIOPSY (Gastric);  Surgeon: [name], MD;  Location: [facility];  Service: Endoscopy;;  . CARDIAC CATHETERIZATION N/A 03/07/2016   Procedure: Left Heart Cath and Coronary Angiography;  Surgeon: Belva Crome, MD;  Location: [facility]  CV LAB;  Service: Cardiovascular;  Laterality: N/A;  . COLONOSCOPY WITH PROPOFOL N/A 05/07/2015   WJX:[id number] diverticulosis, multiple colon polyps removed, tubular adenoma, serrated colon polyp. Next colonoscopy October 2019  . COLONOSCOPY WITH PROPOFOL N/A 08/02/2018   Procedure: COLONOSCOPY WITH PROPOFOL;  Surgeon: [name], MD;  [facility]: [facility];  Service: Endoscopy;  Laterality: N/A;  12:00pm  . CORONARY ARTERY BYPASS GRAFT N/A 03/28/2016   Procedure: CORONARY ARTERY BYPASS GRAFTING (CABG) x 5 USING GREATER SAPHENOUS VEIN;  Surgeon: [name], MD;  [facility];  Service: Open Heart Surgery;  Laterality: N/A;  . ENDOVEIN HARVEST OF GREATER SAPHENOUS VEIN Right 03/28/2016   Procedure: ENDOVEIN HARVEST OF GREATER SAPHENOUS VEIN;  Surgeon: [name], MD;  Location: [facility];  Service: Open Heart Surgery;  Laterality: Right;  . ESOPHAGEAL DILATION N/A 05/07/2015   Procedure: ESOPHAGEAL DILATION WITH 56FR MALONEY DILATOR;  Surgeon: [name], MD;  Location[facility];  Service: Endoscopy;  Laterality: N/A;  . ESOPHAGOGASTRODUODENOSCOPY (EGD) WITH PROPOFOL N/A 05/07/2015   RMR: Status post dilation of normal esophagus. Gastritis.  Marland Kitchen KNEE SURGERY Left    arthroscopy  . MANDIBLE FRACTURE SURGERY    . POLYPECTOMY  05/07/2015   Procedure: POLYPECTOMY (Hepatic Flexure, Distal Transverse Colon, Rectal);  Surgeon: [name], MD;  [facility];  Service: Endoscopy;;  . TEE WITHOUT CARDIOVERSION N/A 03/28/2016   Procedure: TRANSESOPHAGEAL ECHOCARDIOGRAM (TEE);  Surgeon: [name], MD;  Location: [facility];  Service: Open Heart Surgery;  Laterality: N/A;  . TONSILLECTOMY      Current Outpatient Medications  Medication Sig Dispense Refill  . albuterol (PROVENTIL HFA;VENTOLIN HFA) 108 (90 Base) MCG/ACT inhaler Inhale 2 puffs into the lungs every 4 (four) hours as needed for wheezing or shortness of breath.     Marland Kitchen amLODipine (NORVASC) 10 MG tablet TAKE 1 TABLET BY MOUTH ONCE  DAILY. (Patient taking differently: Take 10 mg by mouth daily. ) 90 tablet 1  . diazepam (VALIUM) 5 MG tablet Take 5 mg by mouth 2 (two) times daily as needed for anxiety.     . divalproex (DEPAKOTE) 500 MG DR tablet Take 3 tablets (1,500 mg total) by mouth 2 (two) times daily. 540 tablet 4  . escitalopram (LEXAPRO) 20 MG tablet Take 20 mg by mouth daily.     . Insulin Glargine (LANTUS SOLOSTAR) 100 UNIT/ML Solostar Pen Inject 24 Units into the skin at bedtime. (Patient taking differently: Inject 15 Units into the skin at bedtime. ) 5 pen 3  . lamoTRIgine (LAMICTAL) 25 MG tablet Take 50 mg by mouth 2 (two) times daily.     Marland Kitchen lisinopril (ZESTRIL) 20 MG tablet Take 1 tablet (20 mg total) by mouth daily. 90 tablet 3  . Melatonin 10 MG CAPS Take 1 capsule by mouth at bedtime as needed (  for sleep).     . nitroGLYCERIN (NITROSTAT) 0.4 MG SL tablet Place 1 tablet (0.4 mg total) under the tongue every 5 (five) minutes as needed for chest pain. 25 tablet 3  . paliperidone (INVEGA) 6 MG 24 hr tablet Take 12 mg by mouth daily.    . prazosin (MINIPRESS) 2 MG capsule Take 2-4 mg by mouth at bedtime as needed (for sleep).     . ranolazine (RANEXA) 500 MG 12 hr tablet TAKE (1) TABLET BY MOUTH TWICE DAILY. (Patient taking differently: Take 500 mg by mouth 2 (two) times daily. ) 60 tablet 6  . risperiDONE (RISPERDAL) 3 MG tablet Take 3 mg by mouth at bedtime.     No current facility-administered medications for this visit.   Allergies:  Aspirin   Social History: The patient  reports that he has been smoking cigarettes. He has a 15.00 pack-year smoking history. He has never used smokeless tobacco. He reports that he does not drink alcohol and does not use drugs.   Family History: The patient's family history includes Arthritis in an other family member; Asthma in an other family member; Diabetes in his father and another family member; Hypertension in his father; Stroke in his sister.   ROS:  Please see the  history of present illness. Otherwise, complete review of systems is positive for none.  All other systems are reviewed and negative.   Physical Exam: VS:  BP (!) 148/92   Pulse 62   Ht 5\' 7"  (1.702 m)   Wt 154 lb 9.6 oz (70.1 kg)   SpO2 97%   BMI 24.21 kg/m , BMI Body mass index is 24.21 kg/m.  Wt Readings from Last 3 Encounters:  01/20/20 154 lb 9.6 oz (70.1 kg)  01/14/20 154 lb (69.9 kg)  10/08/19 156 lb (70.8 kg)    General: Patient appears comfortable at rest. HEENT: Conjunctiva and lids normal, oropharynx clear with moist mucosa. Neck: Supple, no elevated JVP or carotid bruits, no thyromegaly. Lungs: Clear to auscultation, nonlabored breathing at rest. Cardiac: Regular rate and rhythm, no S3 or significant systolic murmur, no pericardial rub. Abdomen: Soft, nontender, no hepatomegaly, bowel sounds present, no guarding or rebound. Extremities: No pitting edema, distal pulses 2+. Skin: Warm and dry. Musculoskeletal: No kyphosis. Neuropsychiatric: Alert and oriented x3, affect grossly appropriate.  ECG:    Recent Labwork: 08/27/2019: ALT 15; AST 20; B Natriuretic Peptide 40.0 01/14/2020: BUN 19; Creatinine, Ser 1.37; Hemoglobin 13.9; Platelets 200; Potassium 4.7; Sodium 137     Component Value Date/Time   CHOL 172 07/20/2018 1002   TRIG 93 07/20/2018 1002   HDL 78 07/20/2018 1002   CHOLHDL 2.0 03/22/2016 0853   VLDL 7 03/22/2016 0853   LDLCALC 75 07/20/2018 1002    Other Studies Reviewed Today:  2D echo 03/12/2019 IMPRESSIONS    1. The left ventricle has normal systolic function with an ejection  fraction of 60-65%. The cavity size was normal. Mild to moderate  concentric LVH. Left ventricular diastolic Doppler parameters are  indeterminate. No evidence of left ventricular  regional wall motion abnormalities.  2. Right atrial size was mildly dilated.  3. The mitral valve is grossly normal. Mild thickening of the mitral  valve leaflet.  4. The tricuspid  valve is grossly normal.  5. The aortic valve is tricuspid.  6. The aorta is normal in size and structure.   FINDINGS  Left Ventricle: The left ventricle has normal systolic function, with an  ejection fraction of 60-65%.  The cavity size was normal. Mild to moderate  concentric LVH. Left ventricular diastolic Doppler parameters are  indeterminate. No evidence of left  ventricular regional wall motion abnormalities..   Right Ventricle: The right ventricle has low normal systolic function. The  cavity was normal. There is no increase in right ventricular wall  thickness.   Left Atrium: Left atrial size was normal in size.   Right Atrium: Right atrial size was mildly dilated. Right atrial pressure  is estimated at 10 mmHg.   Interatrial Septum: No atrial level shunt detected by color flow Doppler.   Pericardium: There is no evidence of pericardial effusion.   Mitral Valve: The mitral valve is grossly normal. Mild thickening of the  mitral valve leaflet. Mitral valve regurgitation is mild by color flow  Doppler.   Tricuspid Valve: The tricuspid valve is grossly normal. Tricuspid valve  regurgitation is mild by color flow Doppler.   Aortic Valve: The aortic valve is tricuspid Aortic valve regurgitation was  not visualized by color flow Doppler. There is no evidence of aortic valve  stenosis.   Pulmonic Valve: The pulmonic valve was grossly normal. Pulmonic valve  regurgitation is mild by color flow Doppler.   Aorta: The aorta is normal in size and structure.   Venous: The inferior vena cava is normal in size with greater than 50%  respiratory variability.     NST 01/2019  There was no ST segment deviation noted during stress.  Findings consistent with prior inferior myocardial infarction. There is significant gut radiotracer uptake adjacent to this segment which may create some artifact, however there is hypokinesis of the inferior wall supporting a true prior infarct.  There is no evidence of current ischemia.  The left ventricular ejection fraction is moderately decreased (32%).  This is a high risk study. Risk is based on decreased LVEF. Visually the LVEF looks better than 32%, may be possible gating issue. Recommend correlating with echo. No clear evidence of current myocardium at jeopardy    Assessment and Plan:  1. CAD in native artery   2. Essential hypertension   3. Hyperlipidemia LDL goal <70   4. Tobacco abuse   5. Other chronic pain   6. Claudication ([place])    1. CAD in native artery Complains of increased chest pain with activity.  Recent emergency room visit for same complaints.  Patient was ruled out for ACS.  Status post 5 vessel bypass surgery in 2017 by Dr. Cyndia Bent continue sublingual nitroglycerin as needed for pain, Ranexa 500 mg every 12 hours.  2. Essential hypertension Blood pressure elevated on arrival today.  More than likely due to recent smoking 30 minutes prior to arrival.  Continue amlodipine 10 mg, lisinopril 20 mg daily  3. Hyperlipidemia LDL goal <70 Last recorded lipid panel 03/22/2016, total cholesterol 120, triglycerides 37, HDL 60, LDL 53.  Not on statin.  May need to start on statin due to history of diabetes.  4. Tobacco abuse Patient is a long-term smoker and continues to smoke daily.  Highly encouraged him to stop smoking.  Discussed its contribution to coronary artery disease, strokes, vascular disease.  Patient verbalizes understanding.  Not sure he is ready to quit.  5. Other chronic pain Patient has complained of chronic pain for years.  States he wanted to be referred to a pain clinic in the past but is yet to be contacted by pain clinic.  Patient needs to speak with PCP at [facility] clinic to be referred to pain  clinic  7.  Claudication Patient complains of increased calf and thigh pain when walking and relieved at rest.  History of heavy smoking, diabetes, hypertension.  Get a lower extremity arterial  duplex with ABIs  Medication Adjustments/Labs and Tests Ordered: Current medicines are reviewed at length with the patient today.  Concerns regarding medicines are outlined above.   Disposition: Follow-up with Bronson Ing or APP 6 months  Signed, Levell July, NP 01/20/2020 3:23 PM    [facility] [place], [place], [place] [address] Phone: [phone]; Fax: [phone]

## 2020-01-20 ENCOUNTER — Other Ambulatory Visit: Payer: Self-pay

## 2020-01-20 ENCOUNTER — Encounter: Payer: Self-pay | Admitting: Family Medicine

## 2020-01-20 ENCOUNTER — Ambulatory Visit (INDEPENDENT_AMBULATORY_CARE_PROVIDER_SITE_OTHER): Payer: Medicaid Other | Admitting: Family Medicine

## 2020-01-20 VITALS — BP 148/92 | HR 62 | Ht 67.0 in | Wt 154.6 lb

## 2020-01-20 DIAGNOSIS — I2510 Atherosclerotic heart disease of native coronary artery without angina pectoris: Secondary | ICD-10-CM | POA: Diagnosis not present

## 2020-01-20 DIAGNOSIS — I10 Essential (primary) hypertension: Secondary | ICD-10-CM | POA: Diagnosis not present

## 2020-01-20 DIAGNOSIS — I739 Peripheral vascular disease, unspecified: Secondary | ICD-10-CM

## 2020-01-20 DIAGNOSIS — Z720 Tobacco use: Secondary | ICD-10-CM | POA: Diagnosis not present

## 2020-01-20 DIAGNOSIS — E785 Hyperlipidemia, unspecified: Secondary | ICD-10-CM | POA: Diagnosis not present

## 2020-01-20 DIAGNOSIS — G8929 Other chronic pain: Secondary | ICD-10-CM

## 2020-01-20 NOTE — Addendum Note (Signed)
Addended by: Laurine Blazer on: 01/20/2020 04:05 PM   Modules accepted: Orders

## 2020-01-20 NOTE — Patient Instructions (Addendum)
Medication Instructions:  Continue all current medications.  Labwork: none  Testing/Procedures:  Your physician has requested that you have an ankle brachial index (ABI). During this test an ultrasound and blood pressure cuff are used to evaluate the arteries that supply the arms and legs with blood. Allow thirty minutes for this exam. There are no restrictions or special instructions.  Your physician has requested that you have a lower extremity arterial duplex. During this test, ultrasound are used to evaluate arterial blood flow in the legs. Allow one hour for this exam. There are no restrictions or special instructions.  Office will contact with results via phone or letter.    Follow-Up: 6 months   Any Other Special Instructions Will Be Listed Below (If Applicable).  If you need a refill on your cardiac medications before your next appointment, please call your pharmacy.  

## 2020-01-21 ENCOUNTER — Telehealth: Payer: Self-pay | Admitting: Family Medicine

## 2020-01-21 NOTE — Telephone Encounter (Signed)
Pre-cert Verification for the following procedure    US ARTERIAL SEG MULTIPLE ABI   DATE:    01/27/2020  LOCATION:  [facility]

## 2020-01-27 ENCOUNTER — Ambulatory Visit (HOSPITAL_COMMUNITY): Payer: Medicaid Other

## 2020-01-27 ENCOUNTER — Other Ambulatory Visit: Payer: Self-pay

## 2020-01-27 ENCOUNTER — Ambulatory Visit (HOSPITAL_COMMUNITY)
Admission: RE | Admit: 2020-01-27 | Discharge: 2020-01-27 | Disposition: A | Discharge status: Home / Self Care | Payer: Medicaid Other | Source: Ambulatory Visit | Attending: Family Medicine | Admitting: Family Medicine

## 2020-01-27 DIAGNOSIS — I739 Peripheral vascular disease, unspecified: Secondary | ICD-10-CM | POA: Diagnosis not present

## 2020-01-28 ENCOUNTER — Telehealth: Payer: Self-pay | Admitting: *Deleted

## 2020-01-28 NOTE — Telephone Encounter (Signed)
Patient informed. Copy sent to PCP °

## 2020-01-28 NOTE — Telephone Encounter (Signed)
-----   Message from Verta Ellen., NP sent at 01/27/2020  4:56 PM EDT ----- Lower extremity arterial duplex was negative for significant PAD

## 2020-02-12 ENCOUNTER — Other Ambulatory Visit: Payer: Self-pay | Admitting: "Endocrinology

## 2020-02-12 LAB — BASIC METABOLIC PANEL
BUN: 19 (ref 4–21)
CO2: 29 — AB (ref 13–22)
Chloride: 103 (ref 99–108)
Creatinine: 1.5 — AB (ref 0.6–1.3)
Glucose: 177
Potassium: 4.5 (ref 3.4–5.3)
Sodium: 141 (ref 137–147)

## 2020-02-12 LAB — LIPID PANEL
Cholesterol: 286 — AB (ref 0–200)
HDL: 118 — AB (ref 35–70)
LDL Cholesterol: 145
Triglycerides: 135 (ref 40–160)

## 2020-02-12 LAB — COMPREHENSIVE METABOLIC PANEL
Calcium: 9 (ref 8.7–10.7)
GFR calc Af Amer: 59
GFR calc non Af Amer: 51

## 2020-02-12 LAB — TSH: TSH: 0.5 (ref 0.41–5.90)

## 2020-02-12 LAB — VITAMIN D 25 HYDROXY (VIT D DEFICIENCY, FRACTURES): Vit D, 25-Hydroxy: 27.4

## 2020-02-12 LAB — MICROALBUMIN, URINE: Microalb, Ur: 3358.3

## 2020-02-13 LAB — SPECIMEN STATUS REPORT

## 2020-02-13 LAB — COMPREHENSIVE METABOLIC PANEL
ALT: 13 IU/L (ref 0–44)
AST: 15 IU/L (ref 0–40)
Albumin/Globulin Ratio: 1.7 (ref 1.2–2.2)
Albumin: 3.4 g/dL — ABNORMAL LOW (ref 3.8–4.9)
Alkaline Phosphatase: 58 IU/L (ref 48–121)
BUN/Creatinine Ratio: 13 (ref 9–20)
BUN: 19 mg/dL (ref 6–24)
Bilirubin Total: 0.4 mg/dL (ref 0.0–1.2)
CO2: 29 mmol/L (ref 20–29)
Calcium: 9 mg/dL (ref 8.7–10.2)
Chloride: 103 mmol/L (ref 96–106)
Creatinine, Ser: 1.51 mg/dL — ABNORMAL HIGH (ref 0.76–1.27)
GFR calc Af Amer: 59 mL/min/{1.73_m2} — ABNORMAL LOW (ref >59)
GFR calc non Af Amer: 51 mL/min/{1.73_m2} — ABNORMAL LOW (ref >59)
Globulin, Total: 2 g/dL (ref 1.5–4.5)
Glucose: 177 mg/dL — ABNORMAL HIGH (ref 65–99)
Potassium: 4.5 mmol/L (ref 3.5–5.2)
Sodium: 141 mmol/L (ref 134–144)
Total Protein: 5.4 g/dL — ABNORMAL LOW (ref 6.0–8.5)

## 2020-02-13 LAB — LIPID PANEL W/O CHOL/HDL RATIO
Cholesterol, Total: 286 mg/dL — ABNORMAL HIGH (ref 100–199)
HDL: 118 mg/dL (ref >39)
LDL Chol Calc (NIH): 145 mg/dL — ABNORMAL HIGH (ref 0–99)
Triglycerides: 135 mg/dL (ref 0–149)
VLDL Cholesterol Cal: 23 mg/dL (ref 5–40)

## 2020-02-13 LAB — T4, FREE: Free T4: 1.39 ng/dL (ref 0.82–1.77)

## 2020-02-13 LAB — CALCITRIOL (1,25 DI-OH VIT D): Vit D, 1,25-Dihydroxy: 27.4 pg/mL (ref 19.9–79.3)

## 2020-02-13 LAB — TSH: TSH: 0.499 u[IU]/mL (ref 0.450–4.500)

## 2020-02-13 LAB — MICROALBUMIN, URINE: Microalbumin, Urine: 3358.3 ug/mL

## 2020-02-19 ENCOUNTER — Other Ambulatory Visit: Payer: Self-pay

## 2020-02-19 ENCOUNTER — Ambulatory Visit (INDEPENDENT_AMBULATORY_CARE_PROVIDER_SITE_OTHER): Payer: Medicaid Other | Admitting: "Endocrinology

## 2020-02-19 ENCOUNTER — Encounter: Payer: Self-pay | Admitting: "Endocrinology

## 2020-02-19 VITALS — BP 149/99 | HR 63 | Ht 67.0 in | Wt 154.4 lb

## 2020-02-19 DIAGNOSIS — Z9119 Patient's noncompliance with other medical treatment and regimen: Secondary | ICD-10-CM

## 2020-02-19 DIAGNOSIS — E1159 Type 2 diabetes mellitus with other circulatory complications: Secondary | ICD-10-CM

## 2020-02-19 DIAGNOSIS — E782 Mixed hyperlipidemia: Secondary | ICD-10-CM | POA: Diagnosis not present

## 2020-02-19 DIAGNOSIS — I10 Essential (primary) hypertension: Secondary | ICD-10-CM

## 2020-02-19 DIAGNOSIS — Z91199 Patient's noncompliance with other medical treatment and regimen due to unspecified reason: Secondary | ICD-10-CM

## 2020-02-19 DIAGNOSIS — F17200 Nicotine dependence, unspecified, uncomplicated: Secondary | ICD-10-CM | POA: Diagnosis not present

## 2020-02-19 LAB — POCT GLYCOSYLATED HEMOGLOBIN (HGB A1C): Hemoglobin A1C: 9.2 % — AB (ref 4.0–5.6)

## 2020-02-19 MED ORDER — INSULIN ASPART 100 UNIT/ML FLEXPEN: 6.0000 [IU] | PEN_INJECTOR | Freq: Three times a day (TID) | SUBCUTANEOUS | 2 refills | Status: DC

## 2020-02-19 MED ORDER — LANTUS SOLOSTAR 100 UNIT/ML ~~LOC~~ SOPN: 24.0000 [IU] | PEN_INJECTOR | Freq: Every day | SUBCUTANEOUS | 3 refills | Status: DC

## 2020-02-19 NOTE — Patient Instructions (Signed)

## 2020-02-19 NOTE — Progress Notes (Signed)
02/19/2020   Endocrinology follow-up note   Subjective:    Patient ID: Melvin Taylor, male    DOB: 1965-10-06, [facility], [facility]   Past Medical History:  Diagnosis Date  . Anxiety   . Bipolar 1 disorder ([place])   . Bone spur    left heel  . Cervical radiculopathy   . Chest pain   . Chronic back pain   . Chronic chest wall pain   . Chronic neck pain   . Coronary artery disease    a. s/p CABG in 03/2016 with LIMA-LAD, SVG-D1, SVG-RCA, and Seq SVG-mid and distal OM  . Depression   . Diabetes mellitus    Type II  . Hallucinations    "long history of them"  . Headache(784.0)   . History of gout   . HTN (hypertension)   . Insomnia   . Myocardial infarction ([place]) 2017  . Neuropathy   . Pain management   . Polysubstance abuse ([place])   . Right leg pain    chronic  . Schizophrenia ([place])   . Seizures ([place])    last sz between July 5-9th, 2016; epilepsy  . Shortness of breath dyspnea    with exertion  . Sleep apnea   . Transfusion of blood product refused for religious reason    Past Surgical History:  Procedure Laterality Date  . ANTERIOR CERVICAL DECOMP/DISCECTOMY FUSION N/A 12/14/2015   Procedure: ANTERIOR CERVICAL DECOMPRESSION/DISCECTOMY FUSION CERVICAL FIVE -SIX;  Surgeon: Earnie Larsson, MD;  Location: [facility];  Service: Neurosurgery;  Laterality: N/A;  . BIOPSY  05/07/2015   Procedure: BIOPSY (Gastric);  Surgeon: [name], MD;  Location: [facility];  Service: Endoscopy;;  . CARDIAC CATHETERIZATION N/A 03/07/2016   Procedure: Left Heart Cath and Coronary Angiography;  Surgeon: Belva Crome, MD;  Location: [facility];  Service: Cardiovascular;  Laterality: N/A;  . COLONOSCOPY WITH PROPOFOL N/A 05/07/2015   FUX:NATFTDDUKG diverticulosis, multiple colon polyps removed, tubular adenoma, serrated colon polyp. Next colonoscopy October 2019  . COLONOSCOPY WITH PROPOFOL N/A 08/02/2018   Procedure: COLONOSCOPY WITH PROPOFOL;  Surgeon: [name], MD;   Location: [facility];  Service: Endoscopy;  Laterality: N/A;  12:00pm  . CORONARY ARTERY BYPASS GRAFT N/A 03/28/2016   Procedure: CORONARY ARTERY BYPASS GRAFTING (CABG) x 5 USING GREATER SAPHENOUS VEIN;  Surgeon: [name], MD;  Location: [facility];  Service: Open Heart Surgery;  Laterality: N/A;  . ENDOVEIN HARVEST OF GREATER SAPHENOUS VEIN Right 03/28/2016   Procedure: ENDOVEIN HARVEST OF GREATER SAPHENOUS VEIN;  Surgeon: [name], MD;  Location: [facility];  Service: Open Heart Surgery;  Laterality: Right;  . ESOPHAGEAL DILATION N/A 05/07/2015   Procedure: ESOPHAGEAL DILATION WITH 56FR MALONEY DILATOR;  Surgeon: [name], MD;  Location: [facility];  Service: Endoscopy;  Laterality: N/A;  . ESOPHAGOGASTRODUODENOSCOPY (EGD) WITH PROPOFOL N/A 05/07/2015   RMR: Status post dilation of normal esophagus. Gastritis.  Marland Kitchen KNEE SURGERY Left    arthroscopy  . MANDIBLE FRACTURE SURGERY    . POLYPECTOMY  05/07/2015   Procedure: POLYPECTOMY (Hepatic Flexure, Distal Transverse Colon, Rectal);  Surgeon: [name], MD;  Location: [facility];  Service: Endoscopy;;  . TEE WITHOUT CARDIOVERSION N/A 03/28/2016   Procedure: TRANSESOPHAGEAL ECHOCARDIOGRAM (TEE);  Surgeon: [name], MD;  Location: [facility];  Service: Open Heart Surgery;  Laterality: N/A;  . TONSILLECTOMY     Social History   Socioeconomic History  . Marital status:  Single    Spouse name: Not on file  . Number of children: Not on file  . Years of education: 11th grade  . Highest education level: Not on file  Occupational History  . Occupation: unemployed    Fish farm manager: NOT EMPLOYED  Tobacco Use  . Smoking status: Current Every Day Smoker    Packs/day: 0.50    Years: 30.00    Pack years: 15.00    Types: Cigarettes  . Smokeless tobacco: Never Used  . Tobacco comment: 3 cigs per day  Vaping Use  . Vaping Use: Never used  Substance and Sexual Activity  . Alcohol use: No  . Drug use: No  . Sexual activity: Not Currently  Other  Topics Concern  . Not on file  Social History Narrative   Lives alone   Drinks a cup of coffee a day   Social Determinants of Health   Financial Resource Strain:   . Difficulty of Paying Living Expenses:   Food Insecurity:   . Worried About Charity fundraiser in the Last Year:   . Arboriculturist in the Last Year:   Transportation Needs:   . Film/video editor (Medical):   Marland Kitchen Lack of Transportation (Non-Medical):   Physical Activity:   . Days of Exercise per Week:   . Minutes of Exercise per Session:   Stress:   . Feeling of Stress :   Social Connections:   . Frequency of Communication with Friends and Family:   . Frequency of Social Gatherings with Friends and Family:   . Attends Religious Services:   . Active Member of Clubs or Organizations:   . Attends Archivist Meetings:   Marland Kitchen Marital Status:    Outpatient Encounter Medications as of 02/19/2020  Medication Sig  . albuterol (PROVENTIL HFA;VENTOLIN HFA) 108 (90 Base) MCG/ACT inhaler Inhale 2 puffs into the lungs every 4 (four) hours as needed for wheezing or shortness of breath.   Marland Kitchen amLODipine (NORVASC) 10 MG tablet TAKE 1 TABLET BY MOUTH ONCE DAILY. (Patient taking differently: Take 10 mg by mouth daily. )  . diazepam (VALIUM) 5 MG tablet Take 5 mg by mouth 2 (two) times daily as needed for anxiety.   . divalproex (DEPAKOTE) 500 MG DR tablet Take 3 tablets (1,500 mg total) by mouth 2 (two) times daily.  Marland Kitchen escitalopram (LEXAPRO) 20 MG tablet Take 20 mg by mouth daily.   . insulin aspart (NOVOLOG) 100 UNIT/ML FlexPen Inject 6-12 Units into the skin 3 (three) times daily with meals.  . insulin glargine (LANTUS SOLOSTAR) 100 UNIT/ML Solostar Pen Inject 24 Units into the skin at bedtime.  . lamoTRIgine (LAMICTAL) 25 MG tablet Take 50 mg by mouth 2 (two) times daily.   Marland Kitchen lisinopril (ZESTRIL) 20 MG tablet Take 1 tablet (20 mg total) by mouth daily.  . Melatonin 10 MG CAPS Take 1 capsule by mouth at bedtime as needed  (for sleep).   . nitroGLYCERIN (NITROSTAT) 0.4 MG SL tablet Place 1 tablet (0.4 mg total) under the tongue every 5 (five) minutes as needed for chest pain.  . paliperidone (INVEGA) 6 MG 24 hr tablet Take 12 mg by mouth daily.  . prazosin (MINIPRESS) 2 MG capsule Take 2-4 mg by mouth at bedtime as needed (for sleep).   . ranolazine (RANEXA) 500 MG 12 hr tablet TAKE (1) TABLET BY MOUTH TWICE DAILY. (Patient taking differently: Take 500 mg by mouth 2 (two) times daily. )  . risperiDONE (RISPERDAL)  3 MG tablet Take 3 mg by mouth at bedtime.  . [DISCONTINUED] Insulin Glargine (LANTUS SOLOSTAR) 100 UNIT/ML Solostar Pen Inject 24 Units into the skin at bedtime. (Patient taking differently: Inject 15 Units into the skin at bedtime. )   No facility-administered encounter medications on file as of 02/19/2020.   ALLERGIES: Allergies  Allergen Reactions  . Aspirin Swelling and Other (See Comments)    Facial swelling    VACCINATION STATUS: Immunization History  Administered Date(s) Administered  . Influenza,trivalent, recombinat, inj, PF 05/02/2015  . Moderna SARS-COVID-2 Vaccination 11/01/2019, 12/03/2019  . Tdap 01/18/2015    Diabetes He presents for his follow-up diabetic visit. He has type 2 diabetes mellitus. Onset time: He was diagnosed the approximate age of 70 years. His disease course has been worsening. There are no hypoglycemic associated symptoms. Pertinent negatives for hypoglycemia include no confusion, headaches, pallor or seizures. Associated symptoms include polydipsia and polyuria. Pertinent negatives for diabetes include no chest pain, no fatigue, no polyphagia and no weakness. There are no hypoglycemic complications. Symptoms are worsening. Diabetic complications include heart disease. (He is seriously noncompliant patient. He is now diagnosed with coronary artery disease at [facility] since last visit. He is awaiting for his outpatient cardiology evaluation. This assay to be  set for 03/24/2016.) Risk factors for coronary artery disease include diabetes mellitus, dyslipidemia, hypertension, male sex, tobacco exposure and sedentary lifestyle. Current diabetic treatment includes insulin injections. He is compliant with treatment none of the time. His weight is increasing steadily. He is following a generally unhealthy diet. When asked about meal planning, he reported none. He has had a previous visit with a dietitian. He never participates in exercise. His home blood glucose trend is fluctuating minimally. His breakfast blood glucose range is generally 140-180 mg/dl. His lunch blood glucose range is generally 140-180 mg/dl. His dinner blood glucose range is generally 140-180 mg/dl. His bedtime blood glucose range is generally 140-180 mg/dl. His overall blood glucose range is 140-180 mg/dl. (He is currently consistent on his Lantus 24 weeks nightly, not very consistent with NovoLog.  His point-of-care A1c 7.4%, improving from 9.7%.) An ACE inhibitor/angiotensin II receptor blocker is not being taken.  Hyperlipidemia This is a chronic problem. The current episode started more than 1 year ago. Exacerbating diseases include diabetes. Pertinent negatives include no chest pain, myalgias or shortness of breath. Current antihyperlipidemic treatment includes statins. Risk factors for coronary artery disease include diabetes mellitus, dyslipidemia, hypertension, a sedentary lifestyle and male sex.  Hypertension Pertinent negatives include no chest pain, headaches, neck pain, palpitations or shortness of breath. Risk factors for coronary artery disease include smoking/tobacco exposure, diabetes mellitus, dyslipidemia and male gender.     Review of systems  Constitutional: + Minimally fluctuating body weight,  current  Body mass index is 24.18 kg/m. , no fatigue, no subjective hyperthermia, no subjective hypothermia Eyes: no blurry vision, no xerophthalmia ENT: no sore throat, no nodules  palpated in throat, no dysphagia/odynophagia, no hoarseness Cardiovascular: no Chest Pain, no Shortness of Breath, no palpitations, no leg swelling Respiratory: no cough, no shortness of breath Gastrointestinal: no Nausea/Vomiting/Diarhhea Musculoskeletal: no muscle/joint aches Skin: no rashes, no hyperemia Neurological: no tremors, no numbness, no tingling, no dizziness Psychiatric: no depression, no anxiety    Objective:    BP (!) 149/99   Pulse 63   Ht 5\' 7"  (1.702 m)   Wt 154 lb 6.4 oz (70 kg)   BMI 24.18 kg/m   Wt Readings from Last 3 Encounters:  02/19/20  154 lb 6.4 oz (70 kg)  01/20/20 154 lb 9.6 oz (70.1 kg)  01/14/20 154 lb (69.9 kg)      Physical Exam- Limited  Constitutional:  Body mass index is 24.18 kg/m. , not in acute distress, + dysphoric state of mind Eyes:  EOMI, no exophthalmos Neck: Supple Thyroid: No gross goiter Respiratory: Adequate breathing efforts Musculoskeletal: no gross deformities, strength intact in all four extremities, no gross restriction of joint movements Skin:  no rashes, no hyperemia Neurological: no tremor with outstretched hands, + reluctant affect.    Chemistry (most recent): Lab Results  Component Value Date   NA 141 02/12/2020   K 4.5 02/12/2020   CL 103 02/12/2020   CO2 29 02/12/2020   BUN 19 02/12/2020   CREATININE 1.51 (H) 02/12/2020   Diabetic Labs (most recent): Lab Results  Component Value Date   HGBA1C 9.2 (A) 02/19/2020   HGBA1C 7.4 (A) 10/08/2019   HGBA1C 9.7 (A) 06/11/2019   Lipid Panel     Component Value Date/Time   CHOL 286 (H) 02/12/2020 1334   TRIG 135 02/12/2020 1334   HDL 118 02/12/2020 1334   CHOLHDL 2.0 03/22/2016 0853   VLDL 7 03/22/2016 0853   LDLCALC 145 (H) 02/12/2020 1334    Assessment & Plan:   1. Uncontrolled type 2 diabetes mellitus with complication, with long-term current use of insulin (HCC)  - diabetes is  complicated by Coronary artery disease , CKD , chronic heavy  smoking, noncompliance and patient remains at a high risk for more acute and chronic complications of diabetes which include CAD, CVA, CKD, retinopathy, and neuropathy. These are all discussed in detail with the patient.  -He is returning without any meter nor logs.  His point-of-care A1c is 9.2% increasing from 7.4%.  He does have history of A1c as high as greater than 14% last year.   -I had a long discussion with him about treatment course of type 2 diabetes in the pathology behind its complications. - I have re-counseled the patient on diet management   by adopting a carbohydrate restricted / protein rich  Diet.  - he  admits there is a room for improvement in his diet and drink choices. -  Suggestion is made for him to avoid simple carbohydrates  from his diet including Cakes, Sweet Desserts / Pastries, Ice Cream, Soda (diet and regular), Sweet Tea, Candies, Chips, Cookies, Sweet Pastries,  Store Bought Juices, Alcohol in Excess of  1-2 drinks a day, Artificial Sweeteners, Coffee Creamer, and "Sugar-free" Products. This will help patient to have stable blood glucose profile and potentially avoid unintended weight gain.   - Patient is advised to stick to a routine mealtimes to eat 3 meals  a day and avoid unnecessary snacks ( to snack only to correct hypoglycemia).   - I have approached patient with the following individualized plan to manage diabetes and patient reluctantly accepts .  - He is urged to stay committed for proper monitoring of blood glucose for safe use of insulin.    -He is advised to resume Lantus 24 units daily at bedtime, resume NovoLog 6  units 3 times daily AC for pre-meal blood glucose above 90 mg,  Plus specific correction for readings above 150 mg per DL, associated with strict monitoring of blood glucose 4 times a day before meals and at bedtime. -He is warned not to take insulin without proper monitoring of blood glucose. -He is encouraged to call clinic for blood  glucose  readings less than 70 or greater than 300x3.  - He is not a good candidate for incretin therapy nor metformin/SG LT 2 inhibitors . - Patient specific target  for A1c; LDL, HDL, Triglycerides, and  Waist Circumference were discussed in detail.  2) BP/HTN:  -His blood pressure is not controlled to target.   He has not been consistent taking his blood pressure medications, currently lisinopril 20 mg daily, amlodipine 10 mg daily.  He was supposed to be on hydrochlorothiazide 25 mg p.o. daily, not taking it for unclear reasons.  3)  Hyperlipidemia: His most recent lipid panel showed LDL at 75.  He will continue to benefit from statin intervention.  He is advised to continue atorvastatin 40 mg p.o. nightly. Marland Kitchen     4) Medical noncompliance: He has significant medical history of noncompliance, I advised him to stay focused on self-care.   5) Chronic Care/Health Maintenance:  -Patient  Is on Statin medications and encouraged to continue to follow up with Ophthalmology, Podiatrist at least yearly or according to recommendations, and advised to quit smoking. I have recommended yearly flu vaccine and pneumonia vaccination at least every 5 years; moderate intensity exercise for up to 150 minutes weekly; and  sleep for at least 7 hours a day.  The patient was counseled on the dangers of tobacco use, and was advised to quit.  Reviewed strategies to maximize success, including removing cigarettes and smoking materials from environment.    He is advised to continue follow-up with his PMD for primary care needs. The patient was counseled on the dangers of tobacco use, and was advised to quit.  Reviewed strategies to maximize success, including removing cigarettes and smoking materials from environment.   He is advised to keep close follow-up with his PMD Dr. Legrand Rams.  - Time spent on this patient care encounter:  35 min, of which > 50% was spent in  counseling and the rest reviewing his blood glucose  logs , discussing his hypoglycemia and hyperglycemia episodes, reviewing his current and  previous labs / studies  ( including abstraction from other facilities) and medications  doses and developing a  long term treatment plan and documenting his care.   Please refer to Patient Instructions for Blood Glucose Monitoring and Insulin/Medications Dosing Guide"  in media tab for additional information. Please  also refer to " Patient Self Inventory" in the Media  tab for reviewed elements of pertinent patient history.  Melvin Taylor participated in the discussions, expressed understanding, and voiced agreement with the above plans.  All questions were answered to his satisfaction. he is encouraged to contact clinic should he have any questions or concerns prior to his return visit.   Follow up plan: -Return in about 2 weeks (around 03/04/2020) for F/U with Meter and Logs Only - no Labs.  Glade Lloyd, MD Phone: [phone]  Fax: [phone]   02/19/2020, 1:40 PM

## 2020-02-27 ENCOUNTER — Other Ambulatory Visit: Payer: Self-pay | Admitting: "Endocrinology

## 2020-03-11 ENCOUNTER — Ambulatory Visit (INDEPENDENT_AMBULATORY_CARE_PROVIDER_SITE_OTHER): Payer: Medicaid Other | Admitting: "Endocrinology

## 2020-03-11 ENCOUNTER — Encounter: Payer: Self-pay | Admitting: "Endocrinology

## 2020-03-11 ENCOUNTER — Other Ambulatory Visit: Payer: Self-pay

## 2020-03-11 VITALS — BP 122/83 | HR 96 | Ht 67.0 in | Wt 152.0 lb

## 2020-03-11 DIAGNOSIS — I10 Essential (primary) hypertension: Secondary | ICD-10-CM

## 2020-03-11 DIAGNOSIS — E1159 Type 2 diabetes mellitus with other circulatory complications: Secondary | ICD-10-CM

## 2020-03-11 DIAGNOSIS — F17200 Nicotine dependence, unspecified, uncomplicated: Secondary | ICD-10-CM | POA: Diagnosis not present

## 2020-03-11 DIAGNOSIS — E782 Mixed hyperlipidemia: Secondary | ICD-10-CM

## 2020-03-11 MED ORDER — LANTUS SOLOSTAR 100 UNIT/ML ~~LOC~~ SOPN: 26.0000 [IU] | PEN_INJECTOR | Freq: Every day | SUBCUTANEOUS | 1 refills | Status: DC

## 2020-03-11 NOTE — Progress Notes (Signed)
03/11/2020   Endocrinology follow-up note   Subjective:    Patient ID: Melvin Taylor, male    DOB: [birth year], PCP [facility], [facility]   Past Medical History:  Diagnosis Date  . Anxiety   . Bipolar 1 disorder ([place])   . Bone spur    left heel  . Cervical radiculopathy   . Chest pain   . Chronic back pain   . Chronic chest wall pain   . Chronic neck pain   . Coronary artery disease    a. s/p CABG in 03/2016 with LIMA-LAD, SVG-D1, SVG-RCA, and Seq SVG-mid and distal OM  . Depression   . Diabetes mellitus    Type II  . Hallucinations    "long history of them"  . Headache(784.0)   . History of gout   . HTN (hypertension)   . Insomnia   . Myocardial infarction ([place]) 2017  . Neuropathy   . Pain management   . Polysubstance abuse ([place])   . Right leg pain    chronic  . Schizophrenia ([place])   . Seizures ([place])    last sz between July 5-9th, 2016; epilepsy  . Shortness of breath dyspnea    with exertion  . Sleep apnea   . Transfusion of blood product refused for religious reason    Past Surgical History:  Procedure Laterality Date  . ANTERIOR CERVICAL DECOMP/DISCECTOMY FUSION N/A 12/14/2015   Procedure: ANTERIOR CERVICAL DECOMPRESSION/DISCECTOMY FUSION CERVICAL FIVE -SIX;  Surgeon: Earnie Larsson, MD;  Location: [place] [facility];  Service: Neurosurgery;  Laterality: N/A;  . BIOPSY  05/07/2015   Procedure: BIOPSY (Gastric);  Surgeon: [name], MD;  Location: [facility];  Service: Endoscopy;;  . CARDIAC CATHETERIZATION N/A 03/07/2016   Procedure: Left Heart Cath and Coronary Angiography;  Surgeon: Belva Crome, MD;  Location: [place] [facility];  Service: Cardiovascular;  Laterality: N/A;  . COLONOSCOPY WITH PROPOFOL N/A 05/07/2015   FVC:[id number] diverticulosis, multiple colon polyps removed, tubular adenoma, serrated colon polyp. Next colonoscopy October 2019  . COLONOSCOPY WITH PROPOFOL N/A 08/02/2018   Procedure: COLONOSCOPY WITH PROPOFOL;  Surgeon: [name], MD;   Location: [facility];  Service: Endoscopy;  Laterality: N/A;  12:00pm  . CORONARY ARTERY BYPASS GRAFT N/A 03/28/2016   Procedure: CORONARY ARTERY BYPASS GRAFTING (CABG) x 5 USING GREATER SAPHENOUS VEIN;  Surgeon: [name], MD;  Location: [place] OR;  Service: Open Heart Surgery;  Laterality: N/A;  . ENDOVEIN HARVEST OF GREATER SAPHENOUS VEIN Right 03/28/2016   Procedure: ENDOVEIN HARVEST OF GREATER SAPHENOUS VEIN;  Surgeon: [name], MD;  Location: [place];  Service: Open Heart Surgery;  Laterality: Right;  . ESOPHAGEAL DILATION N/A 05/07/2015   Procedure: ESOPHAGEAL DILATION WITH 56FR MALONEY DILATOR;  Surgeon: [name], MD;  Location: [facility];  Service: Endoscopy;  Laterality: N/A;  . ESOPHAGOGASTRODUODENOSCOPY (EGD) WITH PROPOFOL N/A 05/07/2015   RMR: Status post dilation of normal esophagus. Gastritis.  Marland Kitchen KNEE SURGERY Left    arthroscopy  . MANDIBLE FRACTURE SURGERY    . POLYPECTOMY  05/07/2015   Procedure: POLYPECTOMY (Hepatic Flexure, Distal Transverse Colon, Rectal);  Surgeon: [name], MD;  Location: [facility];  Service: Endoscopy;;  . TEE WITHOUT CARDIOVERSION N/A 03/28/2016   Procedure: TRANSESOPHAGEAL ECHOCARDIOGRAM (TEE);  Surgeon: [name], MD;  Location: [place];  Service: Open Heart Surgery;  Laterality: N/A;  . TONSILLECTOMY     Social History   Socioeconomic History  . Marital status:  Single    Spouse name: Not on file  . Number of children: Not on file  . Years of education: 11th grade  . Highest education level: Not on file  Occupational History  . Occupation: unemployed    Fish farm manager: NOT EMPLOYED  Tobacco Use  . Smoking status: Current Every Day Smoker    Packs/day: 0.50    Years: 30.00    Pack years: 15.00    Types: Cigarettes  . Smokeless tobacco: Never Used  . Tobacco comment: 3 cigs per day  Vaping Use  . Vaping Use: Never used  Substance and Sexual Activity  . Alcohol use: No  . Drug use: No  . Sexual activity: Not Currently  Other  Topics Concern  . Not on file  Social History Narrative   Lives alone   Drinks a cup of coffee a day   Social Determinants of Health   Financial Resource Strain:   . Difficulty of Paying Living Expenses:   Food Insecurity:   . Worried About Charity fundraiser in the Last Year:   . Arboriculturist in the Last Year:   Transportation Needs:   . Film/video editor (Medical):   Marland Kitchen Lack of Transportation (Non-Medical):   Physical Activity:   . Days of Exercise per Week:   . Minutes of Exercise per Session:   Stress:   . Feeling of Stress :   Social Connections:   . Frequency of Communication with Friends and Family:   . Frequency of Social Gatherings with Friends and Family:   . Attends Religious Services:   . Active Member of Clubs or Organizations:   . Attends Archivist Meetings:   Marland Kitchen Marital Status:    Outpatient Encounter Medications as of 03/11/2020  Medication Sig  . ACCU-CHEK GUIDE test strip USE AS DIRECTED TO TEST 4 TIMES DAILY.  Marland Kitchen albuterol (PROVENTIL HFA;VENTOLIN HFA) 108 (90 Base) MCG/ACT inhaler Inhale 2 puffs into the lungs every 4 (four) hours as needed for wheezing or shortness of breath.   Marland Kitchen amLODipine (NORVASC) 10 MG tablet TAKE 1 TABLET BY MOUTH ONCE DAILY. (Patient taking differently: Take 10 mg by mouth daily. )  . diazepam (VALIUM) 5 MG tablet Take 5 mg by mouth 2 (two) times daily as needed for anxiety.   . divalproex (DEPAKOTE) 500 MG DR tablet Take 3 tablets (1,500 mg total) by mouth 2 (two) times daily.  Marland Kitchen escitalopram (LEXAPRO) 20 MG tablet Take 20 mg by mouth daily.   . insulin aspart (NOVOLOG) 100 UNIT/ML FlexPen Inject 6-12 Units into the skin 3 (three) times daily with meals.  . insulin glargine (LANTUS SOLOSTAR) 100 UNIT/ML Solostar Pen Inject 26 Units into the skin at bedtime.  . lamoTRIgine (LAMICTAL) 25 MG tablet Take 50 mg by mouth 2 (two) times daily.   Marland Kitchen lisinopril (ZESTRIL) 20 MG tablet Take 1 tablet (20 mg total) by mouth daily.   . Melatonin 10 MG CAPS Take 1 capsule by mouth at bedtime as needed (for sleep).   . nitroGLYCERIN (NITROSTAT) 0.4 MG SL tablet Place 1 tablet (0.4 mg total) under the tongue every 5 (five) minutes as needed for chest pain.  . paliperidone (INVEGA) 6 MG 24 hr tablet Take 12 mg by mouth daily.  . prazosin (MINIPRESS) 2 MG capsule Take 2-4 mg by mouth at bedtime as needed (for sleep).   . ranolazine (RANEXA) 500 MG 12 hr tablet TAKE (1) TABLET BY MOUTH TWICE DAILY. (Patient taking differently:  Take 500 mg by mouth 2 (two) times daily. )  . risperiDONE (RISPERDAL) 3 MG tablet Take 3 mg by mouth at bedtime.  . [DISCONTINUED] insulin glargine (LANTUS SOLOSTAR) 100 UNIT/ML Solostar Pen Inject 24 Units into the skin at bedtime.   No facility-administered encounter medications on file as of 03/11/2020.   ALLERGIES: Allergies  Allergen Reactions  . Aspirin Swelling and Other (See Comments)    Facial swelling    VACCINATION STATUS: Immunization History  Administered Date(s) Administered  . Influenza,trivalent, recombinat, inj, PF 05/02/2015  . Moderna SARS-COVID-2 Vaccination 11/01/2019, 12/03/2019  . Tdap 01/18/2015    Diabetes He presents for his follow-up diabetic visit. He has type 2 diabetes mellitus. Onset time: He was diagnosed the approximate age of 36 years. His disease course has been worsening. There are no hypoglycemic associated symptoms. Pertinent negatives for hypoglycemia include no confusion, headaches, pallor or seizures. Associated symptoms include polydipsia and polyuria. Pertinent negatives for diabetes include no chest pain, no fatigue, no polyphagia and no weakness. There are no hypoglycemic complications. Symptoms are worsening. Diabetic complications include heart disease. (He is seriously noncompliant patient. He is now diagnosed with coronary artery disease at [facility] since last visit. He is awaiting for his outpatient cardiology evaluation. This assay to be set  for 03/24/2016.) Risk factors for coronary artery disease include diabetes mellitus, dyslipidemia, hypertension, male sex, tobacco exposure and sedentary lifestyle. Current diabetic treatment includes insulin injections. He is compliant with treatment none of the time. His weight is increasing steadily. He is following a generally unhealthy diet. When asked about meal planning, he reported none. He has had a previous visit with a dietitian. He never participates in exercise. His home blood glucose trend is fluctuating minimally. His breakfast blood glucose range is generally 140-180 mg/dl. His overall blood glucose range is 140-180 mg/dl. (He presents with inadequate monitoring of blood glucose, averaging 2 times a day, averaging blood glucose 200 +, when he was supposed to monitor blood glucose 4 times a day.  His recent a1c was 9.2%.  No hypoglycemia.) An ACE inhibitor/angiotensin II receptor blocker is not being taken.  Hyperlipidemia This is a chronic problem. The current episode started more than 1 year ago. Exacerbating diseases include diabetes. Pertinent negatives include no chest pain, myalgias or shortness of breath. Current antihyperlipidemic treatment includes statins. Risk factors for coronary artery disease include diabetes mellitus, dyslipidemia, hypertension, a sedentary lifestyle and male sex.  Hypertension Pertinent negatives include no chest pain, headaches, neck pain, palpitations or shortness of breath. Risk factors for coronary artery disease include smoking/tobacco exposure, diabetes mellitus, dyslipidemia and male gender.     Review of systems  Constitutional: + Minimally fluctuating body weight,  current  Body mass index is 23.81 kg/m. , no fatigue, no subjective hyperthermia, no subjective hypothermia Eyes: no blurry vision, no xerophthalmia ENT: no sore throat, no nodules palpated in throat, no dysphagia/odynophagia, no hoarseness Cardiovascular: no Chest Pain, no Shortness  of Breath, no palpitations, no leg swelling Respiratory: no cough, no shortness of breath Gastrointestinal: no Nausea/Vomiting/Diarhhea Musculoskeletal: no muscle/joint aches Skin: no rashes, no hyperemia Neurological: no tremors, no numbness, no tingling, no dizziness Psychiatric: no depression, no anxiety    Objective:    BP 122/83   Pulse 96   Ht 5\' 7"  (1.702 m)   Wt 152 lb (68.9 kg)   BMI 23.81 kg/m   Wt Readings from Last 3 Encounters:  03/11/20 152 lb (68.9 kg)  02/19/20 154 lb 6.4 oz (70 kg)  01/20/20 154 lb 9.6 oz (70.1 kg)      Physical Exam- Limited  Constitutional:  Body mass index is 23.81 kg/m. , not in acute distress, + dysphoric state of mind Eyes:  EOMI, no exophthalmos Neck: Supple Thyroid: No gross goiter Respiratory: Adequate breathing efforts Musculoskeletal: no gross deformities, strength intact in all four extremities, no gross restriction of joint movements Skin:  no rashes, no hyperemia Neurological: no tremor with outstretched hands, + reluctant affect.    Chemistry (most recent): Lab Results  Component Value Date   NA 141 02/12/2020   K 4.5 02/12/2020   CL 103 02/12/2020   CO2 29 02/12/2020   BUN 19 02/12/2020   CREATININE 1.51 (H) 02/12/2020   Diabetic Labs (most recent): Lab Results  Component Value Date   HGBA1C 9.2 (A) 02/19/2020   HGBA1C 7.4 (A) 10/08/2019   HGBA1C 9.7 (A) 06/11/2019   Lipid Panel     Component Value Date/Time   CHOL 286 (H) 02/12/2020 1334   TRIG 135 02/12/2020 1334   HDL 118 02/12/2020 1334   CHOLHDL 2.0 03/22/2016 0853   VLDL 7 03/22/2016 0853   LDLCALC 145 (H) 02/12/2020 1334    Assessment & Plan:   1. Uncontrolled type 2 diabetes mellitus with complication, with long-term current use of insulin (HCC)  - diabetes is  complicated by Coronary artery disease , CKD , chronic heavy smoking, noncompliance and patient remains at a high risk for more acute and chronic complications of diabetes which  include CAD, CVA, CKD, retinopathy, and neuropathy. These are all discussed in detail with the patient.  He presents with inadequate monitoring of blood glucose, averaging 2 times a day, averaging blood glucose 200 +, when he was supposed to monitor blood glucose 4 times a day.  His recent a1c was 9.2%.  No hypoglycemia. He does have history of A1c as high as greater than 14% last year.   -I had a long discussion with him about treatment course of type 2 diabetes in the pathology behind its complications. - I have re-counseled the patient on diet management   by adopting a carbohydrate restricted / protein rich  Diet.  - he  admits there is a room for improvement in his diet and drink choices. -  Suggestion is made for him to avoid simple carbohydrates  from his diet including Cakes, Sweet Desserts / Pastries, Ice Cream, Soda (diet and regular), Sweet Tea, Candies, Chips, Cookies, Sweet Pastries,  Store Bought Juices, Alcohol in Excess of  1-2 drinks a day, Artificial Sweeteners, Coffee Creamer, and "Sugar-free" Products. This will help patient to have stable blood glucose profile and potentially avoid unintended weight gain.   - Patient is advised to stick to a routine mealtimes to eat 3 meals  a day and avoid unnecessary snacks ( to snack only to correct hypoglycemia).   - I have approached patient with the following individualized plan to manage diabetes and patient reluctantly accepts .  - He is not engaged for proper monitoring of blood glucose for safe use of insulin.  He is approached to get committed.  --He is advised to increase Lantus to 26 units nightly, resume NovoLog 6  units 3 times daily AC for pre-meal blood glucose above 90 mg,  Plus specific correction for readings above 150 mg per DL, associated with strict monitoring of blood glucose 4 times a day before meals and at bedtime. -He is warned not to take insulin without proper monitoring of blood glucose. -  He is encouraged to call  clinic for blood glucose readings less than 70 or greater than 300x3.  - He is not a good candidate for incretin therapy nor metformin/SG LT 2 inhibitors . - Patient specific target  for A1c; LDL, HDL, Triglycerides, and  Waist Circumference were discussed in detail.  2) BP/HTN:  -His blood pressure is controlled to target.   He has not been consistent taking his blood pressure medications, currently lisinopril 20 mg daily, amlodipine 10 mg daily.  He was supposed to be on hydrochlorothiazide 25 mg p.o. daily, not taking it for unclear reasons.  3)  Hyperlipidemia: His most recent lipid panel showed LDL at 75.  He will continue to benefit from statin intervention.  He is advised to continue atorvastatin 40 mg p.o. nightly. Marland Kitchen     4) Medical noncompliance: He has significant medical history of noncompliance, I advised him to stay focused on self-care.   5) Chronic Care/Health Maintenance:  -Patient  Is on Statin medications and encouraged to continue to follow up with Ophthalmology, Podiatrist at least yearly or according to recommendations, and advised to quit smoking. I have recommended yearly flu vaccine and pneumonia vaccination at least every 5 years; moderate intensity exercise for up to 150 minutes weekly; and  sleep for at least 7 hours a day.  The patient was counseled on the dangers of tobacco use, and was advised to quit.  Reviewed strategies to maximize success, including removing cigarettes and smoking materials from environment.    He is advised to continue follow-up with his PMD for primary care needs. The patient was counseled on the dangers of tobacco use, and was advised to quit.  Reviewed strategies to maximize success, including removing cigarettes and smoking materials from environment.   He is advised to keep close follow-up with his PMD Dr. Legrand Rams.  - Time spent on this patient care encounter:  35 min, of which > 50% was spent in  counseling and the rest reviewing his  blood glucose logs , discussing his hypoglycemia and hyperglycemia episodes, reviewing his current and  previous labs / studies  ( including abstraction from other facilities) and medications  doses and developing a  long term treatment plan and documenting his care.   Please refer to Patient Instructions for Blood Glucose Monitoring and Insulin/Medications Dosing Guide"  in media tab for additional information. Please  also refer to " Patient Self Inventory" in the Media  tab for reviewed elements of pertinent patient history.  Lendon Ka Jarrett participated in the discussions, expressed understanding, and voiced agreement with the above plans.  All questions were answered to his satisfaction. he is encouraged to contact clinic should he have any questions or concerns prior to his return visit.    Follow up plan: -Return in about 3 months (around 06/11/2020) for Bring Meter and Logs- A1c in Office.  Glade Lloyd, MD Phone: [phone]  Fax: [phone]   03/11/2020, 3:15 PM

## 2020-03-11 NOTE — Patient Instructions (Signed)

## 2020-03-13 LAB — MICROALBUMIN, URINE: Microalb, Ur: 2774.1

## 2020-04-02 ENCOUNTER — Other Ambulatory Visit: Payer: Self-pay | Admitting: "Endocrinology

## 2020-04-14 ENCOUNTER — Other Ambulatory Visit: Payer: Self-pay | Admitting: Cardiology

## 2020-05-08 LAB — COMPREHENSIVE METABOLIC PANEL
Calcium: 8.9 (ref 8.7–10.7)
GFR calc Af Amer: 61
GFR calc non Af Amer: 53

## 2020-05-08 LAB — LIPID PANEL
Cholesterol: 301 — AB (ref 0–200)
HDL: 101 — AB (ref 35–70)
LDL Cholesterol: 169
Triglycerides: 174 — AB (ref 40–160)

## 2020-05-08 LAB — HEMOGLOBIN A1C: Hemoglobin A1C: 10.4

## 2020-05-08 LAB — BASIC METABOLIC PANEL
BUN: 17 (ref 4–21)
Creatinine: 1.5 — AB (ref 0.6–1.3)

## 2020-05-08 LAB — TSH: TSH: 0.55 (ref 0.41–5.90)

## 2020-05-10 ENCOUNTER — Emergency Department (HOSPITAL_COMMUNITY)
Admission: EM | Admit: 2020-05-10 | Discharge: 2020-05-10 | Disposition: A | Discharge status: Home / Self Care | Payer: Medicaid Other | Attending: Emergency Medicine | Admitting: Emergency Medicine

## 2020-05-10 ENCOUNTER — Emergency Department (HOSPITAL_COMMUNITY): Payer: Medicaid Other

## 2020-05-10 ENCOUNTER — Encounter (HOSPITAL_COMMUNITY): Payer: Self-pay | Admitting: Emergency Medicine

## 2020-05-10 ENCOUNTER — Other Ambulatory Visit: Payer: Self-pay

## 2020-05-10 DIAGNOSIS — Z794 Long term (current) use of insulin: Secondary | ICD-10-CM | POA: Diagnosis not present

## 2020-05-10 DIAGNOSIS — M25512 Pain in left shoulder: Secondary | ICD-10-CM | POA: Diagnosis not present

## 2020-05-10 DIAGNOSIS — Z79899 Other long term (current) drug therapy: Secondary | ICD-10-CM | POA: Insufficient documentation

## 2020-05-10 DIAGNOSIS — E1122 Type 2 diabetes mellitus with diabetic chronic kidney disease: Secondary | ICD-10-CM | POA: Diagnosis not present

## 2020-05-10 DIAGNOSIS — F17210 Nicotine dependence, cigarettes, uncomplicated: Secondary | ICD-10-CM | POA: Insufficient documentation

## 2020-05-10 DIAGNOSIS — I129 Hypertensive chronic kidney disease with stage 1 through stage 4 chronic kidney disease, or unspecified chronic kidney disease: Secondary | ICD-10-CM | POA: Diagnosis not present

## 2020-05-10 DIAGNOSIS — E1140 Type 2 diabetes mellitus with diabetic neuropathy, unspecified: Secondary | ICD-10-CM | POA: Diagnosis not present

## 2020-05-10 DIAGNOSIS — I2510 Atherosclerotic heart disease of native coronary artery without angina pectoris: Secondary | ICD-10-CM | POA: Insufficient documentation

## 2020-05-10 DIAGNOSIS — N183 Chronic kidney disease, stage 3 unspecified: Secondary | ICD-10-CM | POA: Insufficient documentation

## 2020-05-10 DIAGNOSIS — R569 Unspecified convulsions: Secondary | ICD-10-CM | POA: Diagnosis not present

## 2020-05-10 DIAGNOSIS — Z951 Presence of aortocoronary bypass graft: Secondary | ICD-10-CM | POA: Insufficient documentation

## 2020-05-10 DIAGNOSIS — W19XXXA Unspecified fall, initial encounter: Secondary | ICD-10-CM

## 2020-05-10 LAB — RAPID URINE DRUG SCREEN, HOSP PERFORMED
Amphetamines: NOT DETECTED
Barbiturates: NOT DETECTED
Benzodiazepines: NOT DETECTED
Cocaine: NOT DETECTED
Opiates: POSITIVE — AB
Tetrahydrocannabinol: POSITIVE — AB

## 2020-05-10 LAB — VALPROIC ACID LEVEL: Valproic Acid Lvl: <10 ug/mL — ABNORMAL LOW (ref 50.0–100.0)

## 2020-05-10 LAB — CBG MONITORING, ED: Glucose-Capillary: 248 mg/dL — ABNORMAL HIGH (ref 70–99)

## 2020-05-10 MED ORDER — AMLODIPINE BESYLATE 5 MG PO TABS
10.0000 mg | ORAL_TABLET | Freq: Once | ORAL | Status: AC
  Administered 2020-05-10: 10 mg via ORAL
  Filled 2020-05-10: qty 2

## 2020-05-10 MED ORDER — HYDROCODONE-ACETAMINOPHEN 5-325 MG PO TABS
1.0000 | ORAL_TABLET | Freq: Once | ORAL | Status: AC
  Administered 2020-05-10: 1 via ORAL
  Filled 2020-05-10: qty 1

## 2020-05-10 MED ORDER — LISINOPRIL 10 MG PO TABS
20.0000 mg | ORAL_TABLET | Freq: Once | ORAL | Status: AC
  Administered 2020-05-10: 20 mg via ORAL
  Filled 2020-05-10: qty 2

## 2020-05-10 MED ORDER — DIVALPROEX SODIUM 250 MG PO DR TAB
1500.0000 mg | DELAYED_RELEASE_TABLET | Freq: Once | ORAL | Status: AC
  Administered 2020-05-10: 1500 mg via ORAL
  Filled 2020-05-10: qty 6

## 2020-05-10 NOTE — Discharge Instructions (Addendum)
As discussed your Depakote level is not detectable in your bloodstream which is probably the reason why you had your seizure today.  It is very important you take your medication twice daily as prescribed by your neurologist.  However you need to contact this provider tomorrow to let them know that your levels are low.  They may need to adjust your dosing.  I have also given you referral to Dr. Aline Brochure who is our local orthopedist for further evaluation of your shoulder pain if this persists.  You may wear the sling for comfort, also suggest using an ice pack to the shoulder to help with pain and inflammation.  Your x-rays are negative for any fracture or dislocation.

## 2020-05-10 NOTE — ED Provider Notes (Signed)
[facility] EMERGENCY DEPARTMENT Provider Note   CSN: [id number] Arrival date & time: 05/10/20  1134     History Chief Complaint  Patient presents with  . Fall    Melvin Taylor is a 54 y.o. male with a past medical history most significant for seizure disorder, also with diabetes, CAD, hypertension, history of MI, presenting for evaluation of left shoulder pain, injury sustained with suspected seizure.  Patient woke on the floor next to his bed around 5 AM in severe pain about his left shoulder into his neck region.  He suspects he had a seizure causing this injury.  He denies any headache or head pain, he bit his tongue did not cause laceration or wound to the tongue.  He has had grand mal seizures since a teenager, stating his last full seizure was 3 to 4 years ago.  He denies any missed doses of his Depakote, taking 1500 mg twice daily, although states he did not take his morning dose today.  Additionally he has had none of his other morning medications including his blood pressure medications.  He denies weakness or numbness in his extremities.  He has severe pain with any attempts at moving his left shoulder, also pain with neck rotation to the left.  The history is provided by the patient.       Past Medical History:  Diagnosis Date  . Anxiety   . Bipolar 1 disorder ([place])   . Bone spur    left heel  . Cervical radiculopathy   . Chest pain   . Chronic back pain   . Chronic chest wall pain   . Chronic neck pain   . Coronary artery disease    a. s/p CABG in 03/2016 with LIMA-LAD, SVG-D1, SVG-RCA, and Seq SVG-mid and distal OM  . Depression   . Diabetes mellitus    Type II  . Hallucinations    "long history of them"  . Headache(784.0)   . History of gout   . HTN (hypertension)   . Insomnia   . Myocardial infarction ([place]) 2017  . Neuropathy   . Pain management   . Polysubstance abuse ([place])   . Right leg pain    chronic  . Schizophrenia ([place])   . Seizures ([place])      last sz between July 5-9th, 2016; epilepsy  . Shortness of breath dyspnea    with exertion  . Sleep apnea   . Transfusion of blood product refused for religious reason     Patient Active Problem List   Diagnosis Date Noted  . Current smoker 06/21/2019  . Seizure-like activity ([place]) 05/06/2019  . Type 2 diabetes mellitus with stage 3 chronic kidney disease, with long-term current use of insulin ([place]) 04/11/2019  . Anemia, chronic disease 07/04/2018  . Other specified diseases of the digestive system 10/30/2017  . CAD (coronary artery disease) 03/28/2016  . Chest pain 03/21/2016  . Mixed hyperlipidemia 03/11/2016  . Ischemic chest pain ([place])   . NSTEMI (non-ST elevated myocardial infarction) ([place])   . Coronary atherosclerosis of native coronary artery 03/03/2016  . Cervical spinal stenosis 12/14/2015  . Loss of weight 08/25/2015  . Essential hypertension, benign 07/23/2015  . Personal history of noncompliance with medical treatment, presenting hazards to health 07/23/2015  . Abdominal pain 06/08/2015  . Constipation 06/08/2015  . History of colonic polyps   . Diverticulosis of colon without hemorrhage   . Mucosal abnormality of stomach   . Encounter for screening colonoscopy  04/16/2015  . Dysphagia 04/16/2015  . Partial symptomatic epilepsy with complex partial seizures, intractable, without status epilepticus ([place]) 11/10/2014  . Bipolar disorder, unspecified ([place]) 10/15/2014  . Schizophrenia ([place]) 10/15/2014  . DM type 2 causing vascular disease ([place]) 06/29/2014  . Musculoskeletal pain 06/29/2014  . Hyperglycemia without ketosis 09/07/2013  . 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 Other internal derangements of unspecified knee 01/13/2011  . Knee pain 12/28/2010  . Medial meniscus, posterior horn derangement 12/28/2010    Past Surgical History:  Procedure Laterality Date  . ANTERIOR CERVICAL DECOMP/DISCECTOMY FUSION N/A 12/14/2015   Procedure: ANTERIOR  CERVICAL DECOMPRESSION/DISCECTOMY FUSION CERVICAL FIVE -SIX;  Surgeon: Earnie Larsson, MD;  Location: [place] NEURO ORS;  Service: Neurosurgery;  Laterality: N/A;  . BIOPSY  05/07/2015   Procedure: BIOPSY (Gastric);  Surgeon: [name], MD;  Location: [facility] OR[facility];  Service: Endoscopy;;  . CARDIAC CATHETERIZATION N/A 03/07/2016   Procedure: Left Heart Cath and Coronary Angiography;  Surgeon: Belva Crome, MD;  Location: [place] CV LAB;  Service: Cardiovascular;  Laterality: N/A;  . COLONOSCOPY WITH PROPOFOL N/A 05/07/2015   IRS:[id number] diverticulosis, multiple colon polyps removed, tubular adenoma, serrated colon polyp. Next colonoscopy October 2019  . COLONOSCOPY WITH PROPOFOL N/A 08/02/2018   Procedure: COLONOSCOPY WITH PROPOFOL;  Surgeon: [name], MD;  Location: [facility];  Service: Endoscopy;  Laterality: N/A;  12:00pm  . CORONARY ARTERY BYPASS GRAFT N/A 03/28/2016   Procedure: CORONARY ARTERY BYPASS GRAFTING (CABG) x 5 USING GREATER SAPHENOUS VEIN;  Surgeon: [name], MD;  Location: [place] OR;  Service: Open Heart Surgery;  Laterality: N/A;  . ENDOVEIN HARVEST OF GREATER SAPHENOUS VEIN Right 03/28/2016   Procedure: ENDOVEIN HARVEST OF GREATER SAPHENOUS VEIN;  Surgeon: [name], MD;  Location: [place];  Service: Open Heart Surgery;  Laterality: Right;  . ESOPHAGEAL DILATION N/A 05/07/2015   Procedure: ESOPHAGEAL DILATION WITH 56FR MALONEY DILATOR;  Surgeon: [name], MD;  Location: [facility];  Service: Endoscopy;  Laterality: N/A;  . ESOPHAGOGASTRODUODENOSCOPY (EGD) WITH PROPOFOL N/A 05/07/2015   RMR: Status post dilation of normal esophagus. Gastritis.  Marland Kitchen KNEE SURGERY Left    arthroscopy  . MANDIBLE FRACTURE SURGERY    . POLYPECTOMY  05/07/2015   Procedure: POLYPECTOMY (Hepatic Flexure, Distal Transverse Colon, Rectal);  Surgeon: [name], MD;  Location: [facility];  Service: Endoscopy;;  . TEE WITHOUT CARDIOVERSION N/A 03/28/2016   Procedure: TRANSESOPHAGEAL ECHOCARDIOGRAM  (TEE);  Surgeon: [name], MD;  Location: [place];  Service: Open Heart Surgery;  Laterality: N/A;  . TONSILLECTOMY         Family History  Problem Relation Age of Onset  . Diabetes Father   . Hypertension Father   . Arthritis Other   . Diabetes Other   . Asthma Other   . Stroke Sister     Social History   Tobacco Use  . Smoking status: Current Every Day Smoker    Packs/day: 0.50    Years: 30.00    Pack years: 15.00    Types: Cigarettes  . Smokeless tobacco: Never Used  . Tobacco comment: 3 cigs per day  Vaping Use  . Vaping Use: Never used  Substance Use Topics  . Alcohol use: No  . Drug use: Yes    Types: Marijuana    Home Medications Prior to Admission medications   Medication Sig Start Date End Date Taking? Authorizing Provider  ACCU-CHEK GUIDE test strip USE AS DIRECTED TO  TEST 4 TIMES DAILY. 02/27/20   Cassandria Anger, MD  albuterol (PROVENTIL HFA;VENTOLIN HFA) 108 (90 Base) MCG/ACT inhaler Inhale 2 puffs into the lungs every 4 (four) hours as needed for wheezing or shortness of breath.     [provider]  amLODipine (NORVASC) 10 MG tablet Take 1 tablet (10 mg total) by mouth daily. 04/14/20   Verta Ellen., NP  diazepam (VALIUM) 5 MG tablet Take 5 mg by mouth 2 (two) times daily as needed for anxiety.     [provider]  divalproex (DEPAKOTE) 500 MG DR tablet Take 3 tablets (1,500 mg total) by mouth 2 (two) times daily. 05/07/19   Penumalli, Earlean Polka, MD  escitalopram (LEXAPRO) 20 MG tablet Take 20 mg by mouth daily.     [provider]  insulin aspart (NOVOLOG) 100 UNIT/ML FlexPen Inject 6-12 Units into the skin 3 (three) times daily with meals. 02/19/20   Cassandria Anger, MD  insulin glargine (LANTUS SOLOSTAR) 100 UNIT/ML Solostar Pen Inject 26 Units into the skin at bedtime. 03/11/20   Cassandria Anger, MD  lamoTRIgine (LAMICTAL) 25 MG tablet Take 50 mg by mouth 2 (two) times daily.  05/20/19   [provider]  lisinopril (ZESTRIL) 20 MG tablet Take 1 tablet (20 mg total) by mouth daily. 06/26/19 01/19/21  Herminio Commons, MD  Melatonin 10 MG CAPS Take 1 capsule by mouth at bedtime as needed (for sleep).  06/12/19   [provider]  nitroGLYCERIN (NITROSTAT) 0.4 MG SL tablet Place 1 tablet (0.4 mg total) under the tongue every 5 (five) minutes as needed for chest pain. 01/15/20 04/14/20  Herminio Commons, MD  paliperidone (INVEGA) 6 MG 24 hr tablet Take 12 mg by mouth daily.  01/19/21  [provider]  prazosin (MINIPRESS) 2 MG capsule Take 2-4 mg by mouth at bedtime as needed (for sleep).  12/03/19   [provider]  ranolazine (RANEXA) 500 MG 12 hr tablet TAKE (1) TABLET BY MOUTH TWICE DAILY. Patient taking differently: Take 500 mg by mouth 2 (two) times daily.  08/16/19   Herminio Commons, MD  risperiDONE (RISPERDAL) 3 MG tablet Take 3 mg by mouth at bedtime. 08/16/19   [provider]    Allergies    Aspirin  Review of Systems   Review of Systems  Constitutional: Negative for chills and fever.  Musculoskeletal: Positive for arthralgias and neck pain. Negative for joint swelling and myalgias.  Neurological: Positive for seizures. Negative for weakness and numbness.  All other systems reviewed and are negative.   Physical Exam Updated Vital Signs BP (!) 173/112 (BP Location: Right Arm)   Pulse 62   Temp 99.1 F (37.3 C) (Oral)   Resp 17   Ht 5\' 7"  (1.702 m)   Wt 68 kg   SpO2 100%   BMI 23.49 kg/m   Physical Exam Vitals and nursing note reviewed.  Constitutional:      Appearance: He is well-developed.  HENT:     Head: Normocephalic and atraumatic.     Mouth/Throat:     Comments: No mouth or tongue injury. Eyes:     Conjunctiva/sclera: Conjunctivae normal.  Neck:     Comments: Tender to paracervical and trapezius musculature.  There is no spasm appreciated.  He has no midline cervical spine pain.  The left shoulder  appears to be located.  He has severe pain with any movement of the shoulder joint.  Clavicle is nontender  and without deformity. Cardiovascular:     Rate and Rhythm: Normal rate and regular rhythm.     Heart sounds: Normal heart sounds.  Pulmonary:     Effort: Pulmonary effort is normal.     Breath sounds: Normal breath sounds. No wheezing.  Abdominal:     General: Bowel sounds are normal.     Palpations: Abdomen is soft.     Tenderness: There is no abdominal tenderness.  Musculoskeletal:        General: Normal range of motion.     Cervical back: Normal range of motion. Tenderness present.  Skin:    General: Skin is warm and dry.  Neurological:     Mental Status: He is alert.     Sensory: Sensation is intact.     Deep Tendon Reflexes:     Reflex Scores:      Bicep reflexes are 2+ on the right side and 2+ on the left side.    Comments:  Equal grip strength.     ED Results / Procedures / Treatments   Labs (all labs ordered are listed, but only abnormal results are displayed) Labs Reviewed  VALPROIC ACID LEVEL - Abnormal; Notable for the following components:      Result Value   Valproic Acid Lvl <10 (*)    All other components within normal limits  CBG MONITORING, ED - Abnormal; Notable for the following components:   Glucose-Capillary 248 (*)    All other components within normal limits  RAPID URINE DRUG SCREEN, HOSP PERFORMED    EKG None  Radiology DG Cervical Spine Complete  Result Date: 05/10/2020 CLINICAL DATA:  Seizure this morning with fall.  Neck pain. EXAM: CERVICAL SPINE - COMPLETE 4+ VIEW COMPARISON:  Radiographs 03/30/2017. FINDINGS: The prevertebral soft tissues are normal. The cervical alignment is stable with reversal of the usual cervical lordosis. There is no focal angulation or listhesis. There is no evidence of acute cervical spine fracture. The C1-2 articulation appears normal in the AP projection. Patient is status post C5-6 ACDF without hardware  loosening. Interbody lucency at the operative level is noted, although there appears to be partial interbody fusion on CT performed 06/09/2018. Mild osseous foraminal narrowing at C5-6. Previous mandibular ORIF and median sternotomy noted. IMPRESSION: No evidence of acute cervical spine fracture, traumatic subluxation or static signs of instability. Stable postsurgical findings at C5-6. Electronically Signed   By: Richardean Sale M.D.   On: 05/10/2020 13:30   DG Shoulder Left  Result Date: 05/10/2020 CLINICAL DATA:  Seizure this morning.  Fall.  Left shoulder pain. EXAM: LEFT SHOULDER - 2+ VIEW COMPARISON:  Radiographs 08/12/2014. FINDINGS: AP and Y-views are submitted. The mineralization and alignment are normal. There is no evidence of acute fracture or dislocation. Type 2 acromion. The subacromial space is preserved. IMPRESSION: No acute osseous findings. Electronically Signed   By: Richardean Sale M.D.   On: 05/10/2020 13:27    Procedures Procedures (including critical care time)  Medications Ordered in ED Medications  divalproex (DEPAKOTE) DR tablet 1,500 mg (1,500 mg Oral Given 05/10/20 1354)  HYDROcodone-acetaminophen (NORCO/VICODIN) 5-325 MG per tablet 1 tablet (1 tablet Oral Given 05/10/20 1355)  amLODipine (NORVASC) tablet 10 mg (10 mg Oral Given 05/10/20 1353)  lisinopril (ZESTRIL) tablet 20 mg (20 mg Oral Given 05/10/20 1354)    ED Course  I have reviewed the triage vital signs and the nursing notes.  Pertinent labs & imaging results that were available during my care of the patient  were reviewed by me and considered in my medical decision making (see chart for details).    MDM Rules/Calculators/A&P                          Pt with subtherapeutic depakote level. Pt insists he is taking his depakote bid, except for this am's dose being here.  He was given his missed am 1500 mg dose.  After reviewing his other home medicines, he also takes lamictal - he states this is not for  his seizures but for his bipolar disorder and reports compliance with this as well.   Discussed with Dr. Curly Shores with neurology.  Does not recommend any additional loading of depakote, but advised UDS as this will be helpful info for his neurologist.  Ordered here.  Pt not wanting to wait on results.  He was placed in a sling for shoulder comfort, referral to ortho prn for f/u care of shoulder pain if this persists.  Advised pt to contact his neurologist in the am to inform of subtherapeutic level and need for adjusting dosage.  We discussed issue of no driving given seizure - he does not have a license due to his illness, does not drive.   Pt is alert, oriented, baseline at time of dc.  Final Clinical Impression(s) / ED Diagnoses Final diagnoses:  Fall  Acute pain of left shoulder  Seizure secondary to subtherapeutic anticonvulsant medication [facility])    Rx / DC Orders ED Discharge Orders    None       Landis Martins 05/10/20 1606    Fredia Sorrow, MD 05/11/20 2357

## 2020-05-10 NOTE — Progress Notes (Signed)
Brief [facility] Telemedicine Note  Requesting Provider: Evalee Jefferson PA   Depakote 1500 mg BID  Level undetectable  Got home dose 1500 mg PO  Lamictal 50 mg BID (mood disorder dose)   Question:  Need to load prior to discharge?   Recommendation Okay to discharge with close outpatient neurologist follow-up if 100% back to baseline Obtain Utox to guide outpatient management given history of cocaine use.   Brief curbside recommendations only as requested, please request full consult if patient deteriorates or further questions arise   Lesleigh Noe MD-PhD [facility] [phone]

## 2020-05-10 NOTE — ED Notes (Signed)
Patient transported to X-ray 

## 2020-05-10 NOTE — ED Triage Notes (Signed)
Pt had a seizure this morning at [address]. [address] and hit his left shoulder. C/o of left shoulder pain. Denies hitting his head

## 2020-05-11 ENCOUNTER — Other Ambulatory Visit: Payer: Self-pay | Admitting: "Endocrinology

## 2020-05-11 ENCOUNTER — Other Ambulatory Visit: Payer: Self-pay

## 2020-05-13 ENCOUNTER — Encounter: Payer: Self-pay | Admitting: Orthopedic Surgery

## 2020-05-13 ENCOUNTER — Ambulatory Visit (INDEPENDENT_AMBULATORY_CARE_PROVIDER_SITE_OTHER): Payer: Medicaid Other | Admitting: Orthopedic Surgery

## 2020-05-13 ENCOUNTER — Other Ambulatory Visit: Payer: Self-pay

## 2020-05-13 VITALS — BP 181/121 | HR 72 | Ht 67.0 in | Wt 149.0 lb

## 2020-05-13 DIAGNOSIS — S161XXA Strain of muscle, fascia and tendon at neck level, initial encounter: Secondary | ICD-10-CM | POA: Diagnosis not present

## 2020-05-13 NOTE — Progress Notes (Signed)
New Patient Visit  Assessment: Melvin Taylor is a 54 y.o. male with the following: 1. Strain of cervical portion of left trapezius muscle  Plan: Melvin Taylor has a history of seizures, and recently sustained another seizure.  He notes that when he fell, he bumped feeling the left side of his head and neck area.  Since then, he has had persistent pain in the neck and upper back area.  I reviewed the x-rays with him which does not demonstrate any acute findings.  He is not having any pains rating from his neck into his hands.  Although his left shoulder is stiff, the range of motion is comparable to the right shoulder.  In addition, any pain elicited with testing is localized to the left side of his neck.  At this point in time, I feel he sustained a muscle strain, primarily within the trapezius muscle.  This should continue to improve with time.  He is already taking baclofen as a muscle relaxer, and have advised him to continue to take over-the-counter pain medications.  He should also incorporate the use of ice and heat therapy to improve his symptoms.  If he continues to struggle, I would recommend physical therapy.  He is not interested in any therapy at this time.  I am also happy to see him back in clinic if he continues to have problems, but otherwise we do not need to schedule follow-up at this time.  Follow-up: Return if symptoms worsen or fail to improve.  Subjective:  Chief Complaint  Patient presents with  . Shoulder Pain    left shoulder pain into neck,     History of Present Illness: Melvin Taylor is a 54 y.o. male who presents for evaluation of neck and left shoulder pain.  Briefly, he does have a history of seizures, and sustained 1 few days ago.  When he fell, he hit the left side of his head, neck and shoulder area.  X-rays in the ED were deemed to be negative, and he was subsequently referred to clinic today.  Since then, he continues to have pain, primarily located in  the upper back and left side of his neck area.  He has tried taking Tylenol with minimal relief.  He does state that he has taken some narcotics, which has improved his pain.  He is not having any radiating pains into his left arm at this time.  He is also scheduled to see his neurologist to discuss his ongoing prophylaxis for his seizure disorder.  Review of Systems: No numbness or tingling No fevers or chills No chest pain Shortness of breath  Medical History:  Past Medical History:  Diagnosis Date  . Anxiety   . Bipolar 1 disorder ([place])   . Bone spur    left heel  . Cervical radiculopathy   . Chest pain   . Chronic back pain   . Chronic chest wall pain   . Chronic neck pain   . Coronary artery disease    a. s/p CABG in 03/2016 with LIMA-LAD, SVG-D1, SVG-RCA, and Seq SVG-mid and distal OM  . Depression   . Diabetes mellitus    Type II  . Hallucinations    "long history of them"  . Headache(784.0)   . History of gout   . HTN (hypertension)   . Insomnia   . Myocardial infarction ([place]) 2017  . Neuropathy   . Pain management   . Polysubstance abuse ([place])   . Right  leg pain    chronic  . Schizophrenia ([place])   . Seizures ([place])    last sz between July 5-9th, 2016; epilepsy  . Shortness of breath dyspnea    with exertion  . Sleep apnea   . Transfusion of blood product refused for religious reason     Past Surgical History:  Procedure Laterality Date  . ANTERIOR CERVICAL DECOMP/DISCECTOMY FUSION N/A 12/14/2015   Procedure: ANTERIOR CERVICAL DECOMPRESSION/DISCECTOMY FUSION CERVICAL FIVE -SIX;  Surgeon: Earnie Larsson, MD;  Location: [place] [facility];  Service: Neurosurgery;  Laterality: N/A;  . BIOPSY  05/07/2015   Procedure: BIOPSY (Gastric);  Surgeon: [name], MD;  Location: [facility];  Service: Endoscopy;;  . CARDIAC CATHETERIZATION N/A 03/07/2016   Procedure: Left Heart Cath and Coronary Angiography;  Surgeon: Belva Crome, MD;  Location: [place] [facility];  Service:  Cardiovascular;  Laterality: N/A;  . COLONOSCOPY WITH PROPOFOL N/A 05/07/2015   NKN:[id number] diverticulosis, multiple colon polyps removed, tubular adenoma, serrated colon polyp. Next colonoscopy October 2019  . COLONOSCOPY WITH PROPOFOL N/A 08/02/2018   Procedure: COLONOSCOPY WITH PROPOFOL;  Surgeon: [name], MD;  Location: [facility];  Service: Endoscopy;  Laterality: N/A;  12:00pm  . CORONARY ARTERY BYPASS GRAFT N/A 03/28/2016   Procedure: CORONARY ARTERY BYPASS GRAFTING (CABG) x 5 USING GREATER SAPHENOUS VEIN;  Surgeon: [name], MD;  Location: [facility];  Service: Open Heart Surgery;  Laterality: N/A;  . ENDOVEIN HARVEST OF GREATER SAPHENOUS VEIN Right 03/28/2016   Procedure: ENDOVEIN HARVEST OF GREATER SAPHENOUS VEIN;  Surgeon: [name], MD;  Location: [facility];  Service: Open Heart Surgery;  Laterality: Right;  . ESOPHAGEAL DILATION N/A 05/07/2015   Procedure: ESOPHAGEAL DILATION WITH 56FR MALONEY DILATOR;  Surgeon: [name], MD;  Location: [facility];  Service: Endoscopy;  Laterality: N/A;  . ESOPHAGOGASTRODUODENOSCOPY (EGD) WITH PROPOFOL N/A 05/07/2015   RMR: Status post dilation of normal esophagus. Gastritis.  Marland Kitchen KNEE SURGERY Left    arthroscopy  . MANDIBLE FRACTURE SURGERY    . POLYPECTOMY  05/07/2015   Procedure: POLYPECTOMY (Hepatic Flexure, Distal Transverse Colon, Rectal);  Surgeon: [name], MD;  Location: [facility];  Service: Endoscopy;;  . TEE WITHOUT CARDIOVERSION N/A 03/28/2016   Procedure: TRANSESOPHAGEAL ECHOCARDIOGRAM (TEE);  Surgeon: [name], MD;  Location: [place];  Service: Open Heart Surgery;  Laterality: N/A;  . TONSILLECTOMY      Family History  Problem Relation Age of Onset  . Diabetes Father   . Hypertension Father   . Arthritis Other   . Diabetes Other   . Asthma Other   . Stroke Sister    Social History   Tobacco Use  . Smoking status: Current Every Day Smoker    Packs/day: 0.50    Years: 30.00    Pack years: 15.00    Types:  Cigarettes  . Smokeless tobacco: Never Used  . Tobacco comment: 3 cigs per day  Vaping Use  . Vaping Use: Never used  Substance Use Topics  . Alcohol use: No  . Drug use: Yes    Types: Marijuana    Allergies  Allergen Reactions  . Aspirin Swelling and Other (See Comments)    Facial swelling     Current Meds  Medication Sig  . ACCU-CHEK GUIDE test strip USE AS DIRECTED TO TEST 4 TIMES DAILY.  Marland Kitchen albuterol (PROVENTIL HFA;VENTOLIN HFA) 108 (90 Base) MCG/ACT inhaler Inhale 2 puffs into the lungs every 4 (four) hours  as needed for wheezing or shortness of breath.   Marland Kitchen amLODipine (NORVASC) 10 MG tablet Take 1 tablet (10 mg total) by mouth daily.  . baclofen (LIORESAL) 10 MG tablet Take 10 mg by mouth 2 (two) times daily.  . clopidogrel (PLAVIX) 75 MG tablet Take 75 mg by mouth daily.  . diazepam (VALIUM) 5 MG tablet Take 5 mg by mouth 2 (two) times daily as needed for anxiety.   . divalproex (DEPAKOTE) 500 MG DR tablet Take 3 tablets (1,500 mg total) by mouth 2 (two) times daily.  Marland Kitchen escitalopram (LEXAPRO) 20 MG tablet Take 20 mg by mouth daily.   . insulin aspart (NOVOLOG) 100 UNIT/ML FlexPen Inject 6-12 Units into the skin 3 (three) times daily with meals.  . insulin glargine (LANTUS SOLOSTAR) 100 UNIT/ML Solostar Pen Inject 26 Units into the skin at bedtime.  . lamoTRIgine (LAMICTAL) 25 MG tablet Take 50 mg by mouth 2 (two) times daily.   Marland Kitchen lisinopril (ZESTRIL) 20 MG tablet Take 1 tablet (20 mg total) by mouth daily.  . Melatonin 10 MG CAPS Take 1 capsule by mouth at bedtime as needed (for sleep).   . paliperidone (INVEGA) 6 MG 24 hr tablet Take 12 mg by mouth daily.  . prazosin (MINIPRESS) 2 MG capsule Take 2-4 mg by mouth at bedtime as needed (for sleep).   . ranolazine (RANEXA) 500 MG 12 hr tablet TAKE (1) TABLET BY MOUTH TWICE DAILY. (Patient taking differently: Take 500 mg by mouth 2 (two) times daily. )  . risperiDONE (RISPERDAL) 3 MG tablet Take 3 mg by mouth at bedtime.     Objective: BP (!) 181/121   Pulse 72   Ht 5\' 7"  (1.702 m)   Wt 149 lb (67.6 kg)   BMI 23.34 kg/m   Physical Exam:  General:  Thin male, no acute distress Gait: normal gait  Left side of the neck is tender to palpation.  Cervical range of motion is restricted due to pain.  Bilateral shoulders have similar range of motion.  70 degrees of external rotation and 60 degrees of internal rotation in the 90/90 position.  Internal rotation to the level of T12.  Strength is equal bilaterally, 5/5 in the supraspinatus, infraspinatus.  Negative belly press.  Strength testing does elicit pain in his neck area.    IMAGING: I personally reviewed the following images:  X-rays from the emergency department were evaluated in clinic today.  Cervical spine x-rays demonstrate no acute injury.  He does have a surgical fusion at C5-6.  No interval loosening or failure of the associated hardware.  Left shoulder x-ray demonstrates no acute bony injury.  Well-maintained joint space in the glenohumeral joint.  No evidence of proximal humeral migration.  New Medications:  No orders of the defined types were placed in this encounter.     Mordecai Rasmussen, MD  05/13/2020 10:40 AM

## 2020-05-14 ENCOUNTER — Other Ambulatory Visit: Payer: Self-pay | Admitting: Diagnostic Neuroimaging

## 2020-05-14 MED ORDER — DIVALPROEX SODIUM 500 MG PO DR TAB
1500.0000 mg | DELAYED_RELEASE_TABLET | Freq: Two times a day (BID) | ORAL | 0 refills | Status: DC
Start: 2020-05-14 — End: 2020-06-02

## 2020-05-14 NOTE — Telephone Encounter (Signed)
Received refill requests for Depakote and keppra. Noted patient as in ED 05/10/20 for seizure. Called patient and advised he needs FU scheduled. He requested VV due to transpo issues; scheduled for VV 06/02/20. I advised will refill Depakote; Keppra was discontinued following EMU at [facility] last year. Patient verbalized understanding, appreciation.

## 2020-05-27 ENCOUNTER — Other Ambulatory Visit: Payer: Self-pay | Admitting: "Endocrinology

## 2020-06-02 ENCOUNTER — Telehealth (INDEPENDENT_AMBULATORY_CARE_PROVIDER_SITE_OTHER): Payer: Medicaid Other | Admitting: Diagnostic Neuroimaging

## 2020-06-02 ENCOUNTER — Encounter: Payer: Self-pay | Admitting: Diagnostic Neuroimaging

## 2020-06-02 DIAGNOSIS — F3170 Bipolar disorder, currently in remission, most recent episode unspecified: Secondary | ICD-10-CM

## 2020-06-02 DIAGNOSIS — G40219 Localization-related (focal) (partial) symptomatic epilepsy and epileptic syndromes with complex partial seizures, intractable, without status epilepticus: Secondary | ICD-10-CM | POA: Diagnosis not present

## 2020-06-02 MED ORDER — DIVALPROEX SODIUM 500 MG PO DR TAB
1500.0000 mg | DELAYED_RELEASE_TABLET | Freq: Two times a day (BID) | ORAL | 4 refills | Status: DC
Start: 2020-06-02 — End: 2020-12-21

## 2020-06-02 NOTE — Addendum Note (Signed)
Addended by: Inis Sizer D on: 06/02/2020 01:32 PM   Modules accepted: Orders

## 2020-06-02 NOTE — Progress Notes (Signed)
Called patient and informed him the Depakote level  lab order is released, so he can go to [facility]. He stated he knows where labs are, verbalized understanding, appreciation.
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
## 2020-06-06 LAB — VALPROIC ACID LEVEL: Valproic Acid Lvl: 89 ug/mL (ref 50–100)

## 2020-06-08 ENCOUNTER — Ambulatory Visit
Admission: EM | Admit: 2020-06-08 | Discharge: 2020-06-08 | Disposition: A | Discharge status: Home / Self Care | Payer: Medicaid Other | Attending: Emergency Medicine | Admitting: Emergency Medicine

## 2020-06-08 ENCOUNTER — Other Ambulatory Visit: Payer: Self-pay

## 2020-06-08 DIAGNOSIS — M5442 Lumbago with sciatica, left side: Secondary | ICD-10-CM | POA: Diagnosis not present

## 2020-06-08 MED ORDER — PREDNISONE 20 MG PO TABS: 20.0000 mg | ORAL_TABLET | Freq: Two times a day (BID) | ORAL | 0 refills | Status: AC

## 2020-06-08 MED ORDER — DEXAMETHASONE SODIUM PHOSPHATE 10 MG/ML IJ SOLN: 10.0000 mg | Freq: Once | INTRAMUSCULAR | Status: DC

## 2020-06-08 NOTE — Discharge Instructions (Signed)
Continue conservative management of rest, ice, heat, and gentle stretches Declines oral medications today Would like steroid shot Follow up with PCP or back specialist if symptoms persist Return or go to the ER if you have any new or worsening symptoms (fever, chills, chest pain, abdominal pain, changes in bowel or bladder habits, pain radiating into lower legs, etc...)

## 2020-06-08 NOTE — ED Provider Notes (Addendum)
[place]   [id number] 06/08/20 Arrival Time: 0539  CC: Back PAIN  SUBJECTIVE: History from: patient. Melvin Taylor is a 54 y.o. male complains of left low back and leg pain x 3 weeks.  Reports having a seizure in his sleep and falling off the bed. Was unconscious at the time.  Was seen in the ED for neck trauma following seizure.  Speculates he may have injured his back and leg at that time. Localizes the pain to the LT low back and LT leg.  Describes the pain as constant and sharp/ shooting in character.  Has tried OTC medications without relief.  Symptoms are made worse with walking.  Reports hx of back pain and received lumbar injections.  Denies fever, chills, erythema, ecchymosis, effusion, weakness, numbness and tingling, saddle paresthesias, loss of bowel or bladder function.      ROS: As per HPI.  All other pertinent ROS negative.     Past Medical History:  Diagnosis Date  . Anxiety   . Bipolar 1 disorder ([place])   . Bone spur    left heel  . Cervical radiculopathy   . Chest pain   . Chronic back pain   . Chronic chest wall pain   . Chronic neck pain   . Coronary artery disease    a. s/p CABG in 03/2016 with LIMA-LAD, SVG-D1, SVG-RCA, and Seq SVG-mid and distal OM  . Depression   . Diabetes mellitus    Type II  . Hallucinations    "long history of them"  . Headache(784.0)   . History of gout   . HTN (hypertension)   . Insomnia   . Myocardial infarction ([place]) 2017  . Neuropathy   . Pain management   . Polysubstance abuse ([place])   . Right leg pain    chronic  . Schizophrenia ([place])   . Seizures ([place])    last sz between July 5-9th, 2016; epilepsy  . Shortness of breath dyspnea    with exertion  . Sleep apnea   . Transfusion of blood product refused for religious reason    Past Surgical History:  Procedure Laterality Date  . ANTERIOR CERVICAL DECOMP/DISCECTOMY FUSION N/A 12/14/2015   Procedure: ANTERIOR CERVICAL DECOMPRESSION/DISCECTOMY FUSION  CERVICAL FIVE -SIX;  Surgeon: Earnie Larsson, MD;  Location: [facility];  Service: Neurosurgery;  Laterality: N/A;  . BIOPSY  05/07/2015   Procedure: BIOPSY (Gastric);  Surgeon: [name], MD;  Location: [facility];  Service: Endoscopy;;  . CARDIAC CATHETERIZATION N/A 03/07/2016   Procedure: Left Heart Cath and Coronary Angiography;  Surgeon: Belva Crome, MD;  Location: [facility];  Service: Cardiovascular;  Laterality: N/A;  . COLONOSCOPY WITH PROPOFOL N/A 05/07/2015   JQB:[id number] diverticulosis, multiple colon polyps removed, tubular adenoma, serrated colon polyp. Next colonoscopy October 2019  . COLONOSCOPY WITH PROPOFOL N/A 08/02/2018   Procedure: COLONOSCOPY WITH PROPOFOL;  Surgeon: [name], MD;  Location: [facility];  Service: Endoscopy;  Laterality: N/A;  12:00pm  . CORONARY ARTERY BYPASS GRAFT N/A 03/28/2016   Procedure: CORONARY ARTERY BYPASS GRAFTING (CABG) x 5 USING GREATER SAPHENOUS VEIN;  Surgeon: [name], MD;  Location: [facility];  Service: Open Heart Surgery;  Laterality: N/A;  . ENDOVEIN HARVEST OF GREATER SAPHENOUS VEIN Right 03/28/2016   Procedure: ENDOVEIN HARVEST OF GREATER SAPHENOUS VEIN;  Surgeon: [name], MD;  Location: [facility];  Service: Open Heart Surgery;  Laterality: Right;  . ESOPHAGEAL DILATION N/A 05/07/2015  Procedure: ESOPHAGEAL DILATION WITH 56FR MALONEY DILATOR;  Surgeon: [name], MD;  Location: [facility];  Service: Endoscopy;  Laterality: N/A;  . ESOPHAGOGASTRODUODENOSCOPY (EGD) WITH PROPOFOL N/A 05/07/2015   RMR: Status post dilation of normal esophagus. Gastritis.  Marland Kitchen KNEE SURGERY Left    arthroscopy  . MANDIBLE FRACTURE SURGERY    . POLYPECTOMY  05/07/2015   Procedure: POLYPECTOMY (Hepatic Flexure, Distal Transverse Colon, Rectal);  Surgeon: [name], MD;  Location: [facility];  Service: Endoscopy;;  . TEE WITHOUT CARDIOVERSION N/A 03/28/2016   Procedure: TRANSESOPHAGEAL ECHOCARDIOGRAM (TEE);  Surgeon: [name], MD;   Location: [facility];  Service: Open Heart Surgery;  Laterality: N/A;  . TONSILLECTOMY     Allergies  Allergen Reactions  . Aspirin Swelling and Other (See Comments)    Facial swelling    No current facility-administered medications on file prior to encounter.   Current Outpatient Medications on File Prior to Encounter  Medication Sig Dispense Refill  . ACCU-CHEK GUIDE test strip USE AS DIRECTED TO TEST 4 TIMES DAILY. 200 strip 2  . albuterol (PROVENTIL HFA;VENTOLIN HFA) 108 (90 Base) MCG/ACT inhaler Inhale 2 puffs into the lungs every 4 (four) hours as needed for wheezing or shortness of breath.     Marland Kitchen amLODipine (NORVASC) 10 MG tablet Take 1 tablet (10 mg total) by mouth daily. 30 tablet 3  . baclofen (LIORESAL) 10 MG tablet Take 10 mg by mouth 2 (two) times daily.    . clopidogrel (PLAVIX) 75 MG tablet Take 75 mg by mouth daily.    . diazepam (VALIUM) 5 MG tablet Take 5 mg by mouth 2 (two) times daily as needed for anxiety.     . divalproex (DEPAKOTE) 500 MG DR tablet Take 3 tablets (1,500 mg total) by mouth 2 (two) times daily. 540 tablet 4  . escitalopram (LEXAPRO) 20 MG tablet Take 20 mg by mouth daily.     . insulin aspart (NOVOLOG) 100 UNIT/ML FlexPen Inject 6-12 Units into the skin 3 (three) times daily with meals. 5 pen 2  . insulin glargine (LANTUS SOLOSTAR) 100 UNIT/ML Solostar Pen Inject 26 Units into the skin at bedtime. 15 mL 0  . lamoTRIgine (LAMICTAL) 25 MG tablet Take 50 mg by mouth 2 (two) times daily.     Marland Kitchen lisinopril (ZESTRIL) 20 MG tablet Take 1 tablet (20 mg total) by mouth daily. 90 tablet 3  . Melatonin 10 MG CAPS Take 1 capsule by mouth at bedtime as needed (for sleep).     . nitroGLYCERIN (NITROSTAT) 0.4 MG SL tablet Place 1 tablet (0.4 mg total) under the tongue every 5 (five) minutes as needed for chest pain. 25 tablet 3  . paliperidone (INVEGA) 6 MG 24 hr tablet Take 12 mg by mouth daily.    . prazosin (MINIPRESS) 2 MG capsule Take 2-4 mg by mouth at bedtime as  needed (for sleep).     . ranolazine (RANEXA) 500 MG 12 hr tablet TAKE (1) TABLET BY MOUTH TWICE DAILY. (Patient taking differently: Take 500 mg by mouth 2 (two) times daily. ) 60 tablet 6  . risperiDONE (RISPERDAL) 3 MG tablet Take 3 mg by mouth at bedtime.     Social History   Socioeconomic History  . Marital status: Single    Spouse name: Not on file  . Number of children: Not on file  . Years of education: 11th grade  . Highest education level: Not on file  Occupational History  . Occupation: unemployed  Employer: NOT EMPLOYED  Tobacco Use  . Smoking status: Current Every Day Smoker    Packs/day: 0.50    Years: 30.00    Pack years: 15.00    Types: Cigarettes  . Smokeless tobacco: Never Used  . Tobacco comment: 3 cigs per day  Vaping Use  . Vaping Use: Never used  Substance and Sexual Activity  . Alcohol use: No  . Drug use: Yes    Types: Marijuana  . Sexual activity: Not Currently  Other Topics Concern  . Not on file  Social History Narrative   Lives alone   Drinks a cup of coffee a day   Social Determinants of Health   Financial Resource Strain:   . Difficulty of Paying Living Expenses: Not on file  Food Insecurity:   . Worried About Charity fundraiser in the Last Year: Not on file  . Ran Out of Food in the Last Year: Not on file  Transportation Needs:   . Lack of Transportation (Medical): Not on file  . Lack of Transportation (Non-Medical): Not on file  Physical Activity:   . Days of Exercise per Week: Not on file  . Minutes of Exercise per Session: Not on file  Stress:   . Feeling of Stress : Not on file  Social Connections:   . Frequency of Communication with Friends and Family: Not on file  . Frequency of Social Gatherings with Friends and Family: Not on file  . Attends Religious Services: Not on file  . Active Member of Clubs or Organizations: Not on file  . Attends Archivist Meetings: Not on file  . Marital Status: Not on file    Intimate Partner Violence:   . Fear of Current or Ex-Partner: Not on file  . Emotionally Abused: Not on file  . Physically Abused: Not on file  . Sexually Abused: Not on file   Family History  Problem Relation Age of Onset  . Diabetes Father   . Hypertension Father   . Arthritis Other   . Diabetes Other   . Asthma Other   . Stroke Sister     OBJECTIVE:  Vitals:   06/08/20 1302  BP: (!) 143/90  Pulse: 61  Resp: 18  Temp: 98.2 F (36.8 C)  TempSrc: Oral  SpO2: 97%    General appearance: ALERT; in no acute distress.  Head: NCAT Lungs: Normal respiratory effort Musculoskeletal: Back Inspection: Skin warm, dry, clear and intact without obvious erythema, effusion, or ecchymosis.  Palpation: mildly TTP over LT low back ROM: FROM active and passive Strength: 5/5 shld abduction, 5/5 shld adduction, 5/5 elbow flexion, 5/5 elbow extension, 5/5 grip strength, 5/5 hip flexion, 5/5 hip extension, 5/5 knee flexion, 5/5 knee extension Skin: warm and dry Neurologic: Ambulates with a cane Psychological: alert and cooperative; normal mood and affect  ASSESSMENT & PLAN:  1. Acute left-sided low back pain with left-sided sciatica     Meds ordered this encounter  Medications  . dexamethasone (DECADRON) injection 10 mg   Continue conservative management of rest, ice, heat, and gentle stretches Declines oral medications today Would like steroid shot Follow up with PCP or back specialist if symptoms persist Return or go to the ER if you have any new or worsening symptoms (fever, chills, chest pain, abdominal pain, changes in bowel or bladder habits, pain radiating into lower legs, etc...)   Reviewed expectations re: course of current medical issues. Questions answered. Outlined signs and symptoms indicating need for more  acute intervention. Patient verbalized understanding. After Visit Summary given.    Lestine Box, PA-C 06/08/20 1328  Patient changes mind and would like  oral steroid as well.      Lestine Box, PA-C 06/08/20 1339

## 2020-06-08 NOTE — ED Triage Notes (Signed)
Pt present left leg and foot pain, symptoms started two days ago. Pt states that the pain is so bad that he barely walk.  The pain starts from his left hip and radiates down to his left leg and foot.

## 2020-06-09 ENCOUNTER — Telehealth: Payer: Self-pay | Admitting: Podiatry

## 2020-06-09 ENCOUNTER — Encounter: Payer: Self-pay | Admitting: *Deleted

## 2020-06-09 MED ORDER — OXYCODONE-ACETAMINOPHEN 5-325 MG PO TABS: 1.0000 | ORAL_TABLET | ORAL | 0 refills | Status: DC | PRN

## 2020-06-09 NOTE — Addendum Note (Signed)
Addended by: Boneta Lucks on: 06/09/2020 08:42 AM   Modules accepted: Orders

## 2020-06-09 NOTE — Telephone Encounter (Signed)
done

## 2020-06-09 NOTE — Telephone Encounter (Signed)
Patient requesting pain meds to hold them over until they come in to see you 06/17/20, please advise

## 2020-06-11 ENCOUNTER — Other Ambulatory Visit: Payer: Self-pay

## 2020-06-11 ENCOUNTER — Encounter: Payer: Self-pay | Admitting: "Endocrinology

## 2020-06-11 ENCOUNTER — Ambulatory Visit (INDEPENDENT_AMBULATORY_CARE_PROVIDER_SITE_OTHER): Payer: Medicaid Other | Admitting: "Endocrinology

## 2020-06-11 VITALS — BP 120/78 | HR 72 | Ht 67.0 in | Wt 149.2 lb

## 2020-06-11 DIAGNOSIS — E782 Mixed hyperlipidemia: Secondary | ICD-10-CM

## 2020-06-11 DIAGNOSIS — I10 Essential (primary) hypertension: Secondary | ICD-10-CM | POA: Diagnosis not present

## 2020-06-11 DIAGNOSIS — E1159 Type 2 diabetes mellitus with other circulatory complications: Secondary | ICD-10-CM | POA: Diagnosis not present

## 2020-06-11 DIAGNOSIS — Z9119 Patient's noncompliance with other medical treatment and regimen: Secondary | ICD-10-CM

## 2020-06-11 DIAGNOSIS — F17200 Nicotine dependence, unspecified, uncomplicated: Secondary | ICD-10-CM

## 2020-06-11 DIAGNOSIS — Z91199 Patient's noncompliance with other medical treatment and regimen due to unspecified reason: Secondary | ICD-10-CM

## 2020-06-11 MED ORDER — INSULIN ASPART 100 UNIT/ML FLEXPEN
8.0000 [IU] | PEN_INJECTOR | Freq: Three times a day (TID) | SUBCUTANEOUS | 2 refills | Status: DC
Start: 2020-06-11 — End: 2020-09-09

## 2020-06-11 MED ORDER — LANTUS SOLOSTAR 100 UNIT/ML ~~LOC~~ SOPN: 30.0000 [IU] | PEN_INJECTOR | Freq: Every day | SUBCUTANEOUS | 0 refills | Status: DC

## 2020-06-11 NOTE — Progress Notes (Signed)
06/11/2020   Endocrinology follow-up note   Subjective:    Patient ID: Melvin Taylor, male    DOB: 1966/05/23, PCP [name], FNP   Past Medical History:  Diagnosis Date  . Anxiety   . Bipolar 1 disorder ([place])   . Bone spur    left heel  . Cervical radiculopathy   . Chest pain   . Chronic back pain   . Chronic chest wall pain   . Chronic neck pain   . Coronary artery disease    a. s/p CABG in 03/2016 with LIMA-LAD, SVG-D1, SVG-RCA, and Seq SVG-mid and distal OM  . Depression   . Diabetes mellitus    Type II  . Hallucinations    "long history of them"  . Headache(784.0)   . History of gout   . HTN (hypertension)   . Insomnia   . Myocardial infarction ([place]) 2017  . Neuropathy   . Pain management   . Polysubstance abuse ([place])   . Right leg pain    chronic  . Schizophrenia ([place])   . Seizures ([place])    last sz between July 5-9th, 2016; epilepsy  . Shortness of breath dyspnea    with exertion  . Sleep apnea   . Transfusion of blood product refused for religious reason    Past Surgical History:  Procedure Laterality Date  . ANTERIOR CERVICAL DECOMP/DISCECTOMY FUSION N/A 12/14/2015   Procedure: ANTERIOR CERVICAL DECOMPRESSION/DISCECTOMY FUSION CERVICAL FIVE -SIX;  Surgeon: Earnie Larsson, MD;  Location: [place] [facility];  Service: Neurosurgery;  Laterality: N/A;  . BIOPSY  05/07/2015   Procedure: BIOPSY (Gastric);  Surgeon: [name], MD;  Location: [facility];  Service: Endoscopy;;  . CARDIAC CATHETERIZATION N/A 03/07/2016   Procedure: Left Heart Cath and Coronary Angiography;  Surgeon: Belva Crome, MD;  Location: [place] [facility];  Service: Cardiovascular;  Laterality: N/A;  . COLONOSCOPY WITH PROPOFOL N/A 05/07/2015   LNL:GXQJJHERDE diverticulosis, multiple colon polyps removed, tubular adenoma, serrated colon polyp. Next colonoscopy October 2019  . COLONOSCOPY WITH PROPOFOL N/A 08/02/2018   Procedure: COLONOSCOPY WITH PROPOFOL;  Surgeon: [name], MD;   Location: [facility];  Service: Endoscopy;  Laterality: N/A;  12:00pm  . CORONARY ARTERY BYPASS GRAFT N/A 03/28/2016   Procedure: CORONARY ARTERY BYPASS GRAFTING (CABG) x 5 USING GREATER SAPHENOUS VEIN;  Surgeon: [name], MD;  Location: [place] [facility];  Service: Open Heart Surgery;  Laterality: N/A;  . ENDOVEIN HARVEST OF GREATER SAPHENOUS VEIN Right 03/28/2016   Procedure: ENDOVEIN HARVEST OF GREATER SAPHENOUS VEIN;  Surgeon: [name], MD;  Location: [place];  Service: Open Heart Surgery;  Laterality: Right;  . ESOPHAGEAL DILATION N/A 05/07/2015   Procedure: ESOPHAGEAL DILATION WITH 56FR MALONEY DILATOR;  Surgeon: [name], MD;  Location: [facility];  Service: Endoscopy;  Laterality: N/A;  . ESOPHAGOGASTRODUODENOSCOPY (EGD) WITH PROPOFOL N/A 05/07/2015   RMR: Status post dilation of normal esophagus. Gastritis.  Marland Kitchen KNEE SURGERY Left    arthroscopy  . MANDIBLE FRACTURE SURGERY    . POLYPECTOMY  05/07/2015   Procedure: POLYPECTOMY (Hepatic Flexure, Distal Transverse Colon, Rectal);  Surgeon: [name], MD;  Location: [facility];  Service: Endoscopy;;  . TEE WITHOUT CARDIOVERSION N/A 03/28/2016   Procedure: TRANSESOPHAGEAL ECHOCARDIOGRAM (TEE);  Surgeon: [name], MD;  Location: [place];  Service: Open Heart Surgery;  Laterality: N/A;  . TONSILLECTOMY     Social History   Socioeconomic History  . Marital status: Single  Spouse name: Not on file  . Number of children: Not on file  . Years of education: 11th grade  . Highest education level: Not on file  Occupational History  . Occupation: unemployed    Fish farm manager: NOT EMPLOYED  Tobacco Use  . Smoking status: Current Every Day Smoker    Packs/day: 0.50    Years: 30.00    Pack years: 15.00    Types: Cigarettes  . Smokeless tobacco: Never Used  . Tobacco comment: 3 cigs per day  Vaping Use  . Vaping Use: Never used  Substance and Sexual Activity  . Alcohol use: No  . Drug use: Yes    Types: Marijuana  . Sexual activity: Not  Currently  Other Topics Concern  . Not on file  Social History Narrative   Lives alone   Drinks a cup of coffee a day   Social Determinants of Health   Financial Resource Strain:   . Difficulty of Paying Living Expenses: Not on file  Food Insecurity:   . Worried About Charity fundraiser in the Last Year: Not on file  . Ran Out of Food in the Last Year: Not on file  Transportation Needs:   . Lack of Transportation (Medical): Not on file  . Lack of Transportation (Non-Medical): Not on file  Physical Activity:   . Days of Exercise per Week: Not on file  . Minutes of Exercise per Session: Not on file  Stress:   . Feeling of Stress : Not on file  Social Connections:   . Frequency of Communication with Friends and Family: Not on file  . Frequency of Social Gatherings with Friends and Family: Not on file  . Attends Religious Services: Not on file  . Active Member of Clubs or Organizations: Not on file  . Attends Archivist Meetings: Not on file  . Marital Status: Not on file   Outpatient Encounter Medications as of 06/11/2020  Medication Sig  . ACCU-CHEK GUIDE test strip USE AS DIRECTED TO TEST 4 TIMES DAILY.  Marland Kitchen albuterol (PROVENTIL HFA;VENTOLIN HFA) 108 (90 Base) MCG/ACT inhaler Inhale 2 puffs into the lungs every 4 (four) hours as needed for wheezing or shortness of breath.   Marland Kitchen amLODipine (NORVASC) 10 MG tablet Take 1 tablet (10 mg total) by mouth daily.  . baclofen (LIORESAL) 10 MG tablet Take 10 mg by mouth 2 (two) times daily.  . clopidogrel (PLAVIX) 75 MG tablet Take 75 mg by mouth daily.  . diazepam (VALIUM) 5 MG tablet Take 5 mg by mouth 2 (two) times daily as needed for anxiety.   . divalproex (DEPAKOTE) 500 MG DR tablet Take 3 tablets (1,500 mg total) by mouth 2 (two) times daily.  Marland Kitchen escitalopram (LEXAPRO) 20 MG tablet Take 20 mg by mouth daily.   . insulin aspart (NOVOLOG) 100 UNIT/ML FlexPen Inject 8-14 Units into the skin 3 (three) times daily with meals.  .  insulin glargine (LANTUS SOLOSTAR) 100 UNIT/ML Solostar Pen Inject 30 Units into the skin at bedtime.  . lamoTRIgine (LAMICTAL) 25 MG tablet Take 50 mg by mouth 2 (two) times daily.   Marland Kitchen lisinopril (ZESTRIL) 20 MG tablet Take 1 tablet (20 mg total) by mouth daily.  . Melatonin 10 MG CAPS Take 1 capsule by mouth at bedtime as needed (for sleep).   . nitroGLYCERIN (NITROSTAT) 0.4 MG SL tablet Place 1 tablet (0.4 mg total) under the tongue every 5 (five) minutes as needed for chest pain.  Marland Kitchen oxyCODONE-acetaminophen (  PERCOCET) 5-325 MG tablet Take 1-2 tablets by mouth every 4 (four) hours as needed for severe pain.  . paliperidone (INVEGA) 6 MG 24 hr tablet Take 12 mg by mouth daily.  . prazosin (MINIPRESS) 2 MG capsule Take 2-4 mg by mouth at bedtime as needed (for sleep).   . predniSONE (DELTASONE) 20 MG tablet Take 1 tablet (20 mg total) by mouth 2 (two) times daily with a meal for 5 days.  . ranolazine (RANEXA) 500 MG 12 hr tablet TAKE (1) TABLET BY MOUTH TWICE DAILY. (Patient taking differently: Take 500 mg by mouth 2 (two) times daily. )  . risperiDONE (RISPERDAL) 3 MG tablet Take 3 mg by mouth at bedtime.  . [DISCONTINUED] insulin aspart (NOVOLOG) 100 UNIT/ML FlexPen Inject 6-12 Units into the skin 3 (three) times daily with meals.  . [DISCONTINUED] insulin glargine (LANTUS SOLOSTAR) 100 UNIT/ML Solostar Pen Inject 26 Units into the skin at bedtime.   No facility-administered encounter medications on file as of 06/11/2020.   ALLERGIES: Allergies  Allergen Reactions  . Aspirin Swelling and Other (See Comments)    Facial swelling    VACCINATION STATUS: Immunization History  Administered Date(s) Administered  . Influenza,trivalent, recombinat, inj, PF 05/02/2015  . Moderna SARS-COVID-2 Vaccination 11/01/2019, 12/03/2019  . Tdap 01/18/2015    Diabetes He presents for his follow-up diabetic visit. He has type 2 diabetes mellitus. Onset time: He was diagnosed the approximate age of 24  years. His disease course has been worsening. There are no hypoglycemic associated symptoms. Pertinent negatives for hypoglycemia include no confusion, headaches, pallor or seizures. Associated symptoms include polydipsia and polyuria. Pertinent negatives for diabetes include no chest pain, no fatigue, no polyphagia and no weakness. There are no hypoglycemic complications. Symptoms are worsening. Diabetic complications include heart disease. (He is seriously noncompliant patient. He is now diagnosed with coronary artery disease at [facility] since last visit. He is awaiting for his outpatient cardiology evaluation. This assay to be set for 03/24/2016.) Risk factors for coronary artery disease include diabetes mellitus, dyslipidemia, hypertension, male sex, tobacco exposure and sedentary lifestyle. Current diabetic treatment includes insulin injections. He is compliant with treatment none of the time. His weight is fluctuating minimally. He is following a generally unhealthy diet. When asked about meal planning, he reported none. He has had a previous visit with a dietitian. He never participates in exercise. His home blood glucose trend is increasing steadily. His breakfast blood glucose range is generally >200 mg/dl. His dinner blood glucose range is generally >200 mg/dl. His overall blood glucose range is >200 mg/dl. (He presents with inadequate and random glucose testing proximately 2 times a day.  His most recent A1c was 10.4% on May 08, 2020, increasing from his previous A1c of 9.2%.  He did not document hypoglycemia. ) An ACE inhibitor/angiotensin II receptor blocker is not being taken.  Hyperlipidemia This is a chronic problem. The current episode started more than 1 year ago. Exacerbating diseases include diabetes. Pertinent negatives include no chest pain, myalgias or shortness of breath. Current antihyperlipidemic treatment includes statins. Risk factors for coronary artery disease include  diabetes mellitus, dyslipidemia, hypertension, a sedentary lifestyle and male sex.  Hypertension Pertinent negatives include no chest pain, headaches, neck pain, palpitations or shortness of breath. Risk factors for coronary artery disease include smoking/tobacco exposure, diabetes mellitus, dyslipidemia and male gender.     Review of systems  Constitutional: + Minimally fluctuating body weight,  current  Body mass index is 23.37 kg/m. , no fatigue,  no subjective hyperthermia, no subjective hypothermia Eyes: no blurry vision, no xerophthalmia ENT: no sore throat, no nodules palpated in throat, no dysphagia/odynophagia, no hoarseness Cardiovascular: no Chest Pain, no Shortness of Breath, no palpitations, no leg swelling Respiratory: ++ cough, no shortness of breath Gastrointestinal: no Nausea/Vomiting/Diarhhea Musculoskeletal: no muscle/joint aches Skin: no rashes, no hyperemia Neurological: no tremors, no numbness, no tingling, no dizziness Psychiatric: no depression, no anxiety    Objective:    BP 120/78   Pulse 72   Ht 5\' 7"  (1.702 m)   Wt 149 lb 3.2 oz (67.7 kg)   BMI 23.37 kg/m   Wt Readings from Last 3 Encounters:  06/11/20 149 lb 3.2 oz (67.7 kg)  05/13/20 149 lb (67.6 kg)  05/10/20 150 lb (68 kg)      Physical Exam- Limited  Constitutional:  Body mass index is 23.37 kg/m. , not in acute distress, + dysphoric state of mind Eyes:  EOMI, no exophthalmos Neck: Supple Thyroid: No gross goiter Respiratory: Adequate breathing efforts Musculoskeletal: no gross deformities, strength intact in all four extremities, no gross restriction of joint movements Skin:  no rashes, no hyperemia Neurological: no tremor with outstretched hands, + reluctant affect.    Chemistry (most recent): Lab Results  Component Value Date   NA 141 02/12/2020   K 4.5 02/12/2020   CL 103 02/12/2020   CO2 29 02/12/2020   BUN 17 05/08/2020   CREATININE 1.5 (A) 05/08/2020   Diabetic Labs  (most recent): Lab Results  Component Value Date   HGBA1C 10.4 05/08/2020   HGBA1C 9.2 (A) 02/19/2020   HGBA1C 7.4 (A) 10/08/2019   Lipid Panel     Component Value Date/Time   CHOL 301 (A) 05/08/2020 0000   CHOL 286 (H) 02/12/2020 1334   TRIG 174 (A) 05/08/2020 0000   HDL 101 (A) 05/08/2020 0000   HDL 118 02/12/2020 1334   CHOLHDL 2.0 03/22/2016 0853   VLDL 7 03/22/2016 0853   LDLCALC 169 05/08/2020 0000   LDLCALC 145 (H) 02/12/2020 1334    Assessment & Plan:   1. Uncontrolled type 2 diabetes mellitus with complication, with long-term current use of insulin (HCC)  - diabetes is  complicated by Coronary artery disease , CKD , chronic heavy smoking, noncompliance and patient remains at a high risk for more acute and chronic complications of diabetes which include CAD, CVA, CKD, retinopathy, and neuropathy. These are all discussed in detail with the patient.  He presents with inadequate and random glucose testing proximately 2 times a day.  His most recent A1c was 10.4% on May 08, 2020, increasing from his previous A1c of 9.2%.  He did not document hypoglycemia.  -I had a long discussion with him about treatment course of type 2 diabetes in the pathology behind its complications. - I have re-counseled the patient on diet management   by adopting a carbohydrate restricted / protein rich  Diet.  - he  admits there is a room for improvement in his diet and drink choices. -  Suggestion is made for him to avoid simple carbohydrates  from his diet including Cakes, Sweet Desserts / Pastries, Ice Cream, Soda (diet and regular), Sweet Tea, Candies, Chips, Cookies, Sweet Pastries,  Store Bought Juices, Alcohol in Excess of  1-2 drinks a day, Artificial Sweeteners, Coffee Creamer, and "Sugar-free" Products. This will help patient to have stable blood glucose profile and potentially avoid unintended weight gain.   - Patient is advised to stick to a routine mealtimes  to eat 3 meals  a day and  avoid unnecessary snacks ( to snack only to correct hypoglycemia).   - I have approached patient with the following individualized plan to manage diabetes and patient reluctantly accepts .  - He remains alarmingly noncompliant, is not engaged for proper monitoring of blood glucose for safe use of insulin.  He is approached to get committed.  --He is advised to increase Lantus to 30 units nightly, NovoLog to 8   units 3 times daily AC for pre-meal blood glucose above 90 mg,  Plus specific correction for readings above 150 mg per DL, associated with strict monitoring of blood glucose 4 times a day before meals and at bedtime. -He is warned not to take insulin without proper monitoring of blood glucose. -He is encouraged to call clinic for blood glucose readings less than 70 or greater than 300x3.  - He is not a suitable candidate for incretin therapy nor metformin/SG LT 2 inhibitors . - Patient specific target  for A1c; LDL, HDL, Triglycerides, and  Waist Circumference were discussed in detail.  2) BP/HTN:  -His blood pressure is controlled to target.   He has not been consistent taking his blood pressure medications, currently lisinopril 20 mg daily, amlodipine 10 mg daily.  He was supposed to be on hydrochlorothiazide 25 mg p.o. daily, not taking it for unclear reasons.  3)  Hyperlipidemia: His most recent lipid panel showed LDL at 75.  He will continue to benefit from statin intervention.  He is advised to continue atorvastatin 40 mg p.o. nightly.       4) Medical noncompliance: He has significant medical history of noncompliance, I advised him to stay focused on self-care.   5) Chronic Care/Health Maintenance:  -Patient  Is on Statin medications and encouraged to continue to follow up with Ophthalmology, Podiatrist at least yearly or according to recommendations, and advised to quit smoking. I have recommended yearly flu vaccine and pneumonia vaccination at least every 5 years; moderate  intensity exercise for up to 150 minutes weekly; and  sleep for at least 7 hours a day.  The patient was counseled on the dangers of tobacco use, and was advised to quit.  Reviewed strategies to maximize success, including removing cigarettes and smoking materials from environment.     He is advised to continue follow-up with his PMD for primary care needs.   He is advised to keep close follow-up with his PMD Dr. Legrand Rams.  - Time spent on this patient care encounter:  35 min, of which > 50% was spent in  counseling and the rest reviewing his blood glucose logs , discussing his hypoglycemia and hyperglycemia episodes, reviewing his current and  previous labs / studies  ( including abstraction from other facilities) and medications  doses and developing a  long term treatment plan and documenting his care.   Please refer to Patient Instructions for Blood Glucose Monitoring and Insulin/Medications Dosing Guide"  in media tab for additional information. Please  also refer to " Patient Self Inventory" in the Media  tab for reviewed elements of pertinent patient history.  Lendon Ka Offner participated in the discussions, expressed understanding, and voiced agreement with the above plans.  All questions were answered to his satisfaction. he is encouraged to contact clinic should he have any questions or concerns prior to his return visit.   Follow up plan: -Return in about 2 weeks (around 06/25/2020) for F/U with Meter and Logs Only - no Labs.  Glade Lloyd, MD Phone: [phone]  Fax: [phone]   06/11/2020, 10:26 AM

## 2020-06-11 NOTE — Patient Instructions (Signed)

## 2020-06-16 ENCOUNTER — Telehealth: Payer: Self-pay | Admitting: *Deleted

## 2020-06-16 ENCOUNTER — Encounter: Payer: Self-pay | Admitting: Emergency Medicine

## 2020-06-16 ENCOUNTER — Ambulatory Visit
Admission: EM | Admit: 2020-06-16 | Discharge: 2020-06-16 | Disposition: A | Discharge status: Home / Self Care | Payer: Medicaid Other | Attending: Emergency Medicine | Admitting: Emergency Medicine

## 2020-06-16 ENCOUNTER — Other Ambulatory Visit: Payer: Self-pay

## 2020-06-16 ENCOUNTER — Telehealth: Payer: Self-pay | Admitting: Podiatry

## 2020-06-16 DIAGNOSIS — M79672 Pain in left foot: Secondary | ICD-10-CM

## 2020-06-16 MED ORDER — DEXAMETHASONE SODIUM PHOSPHATE 10 MG/ML IJ SOLN
10.0000 mg | Freq: Once | INTRAMUSCULAR | Status: AC
  Administered 2020-06-16: 10 mg via INTRAMUSCULAR

## 2020-06-16 NOTE — Telephone Encounter (Signed)
Pt is in severe pain, patient is requesting pain meds until tomorrow. He might consider going to the ER

## 2020-06-16 NOTE — Telephone Encounter (Signed)
Patient is requesting something for pain until his visit on tomorrow, may have to go to ER.  He has been once and was given an injection/prednisone prescription and is still having pain from middle bottom of left  foot up into legs and back,unable to walk,some discomfort as well. He is out of his pain medicine prescribed(Percocet 5-325mg ).Please advise.

## 2020-06-16 NOTE — ED Provider Notes (Signed)
[place]   [id number] 06/16/20 Arrival Time: [place]  Chief Complaint  Patient presents with  . Foot Pain     SUBJECTIVE: History from: patient.  Melvin Taylor is a 54 y.o. male presents to the urgent care with a complaint of left foot pain that radiated to the left leg.  Denies any precipitating event.  He reported he has a bone spur in his left foot and his surgery was done previously at [facility].  He localizes the pain to the sole of left foot.  He describes the pain as constant and achy.  He was prescribed Percocet few days ago with symptom relief.  He has tried OTC medications with mild relief.  His symptoms are made worse with ROM.  No proceeding symptoms in the past.  Denies chills, fever, nausea, vomiting, diarrhea.   ROS: As per HPI.  All other pertinent ROS negative.     Past Medical History:  Diagnosis Date  . Anxiety   . Bipolar 1 disorder ([place])   . Bone spur    left heel  . Cervical radiculopathy   . Chest pain   . Chronic back pain   . Chronic chest wall pain   . Chronic neck pain   . Coronary artery disease    a. s/p CABG in 03/2016 with LIMA-LAD, SVG-D1, SVG-RCA, and Seq SVG-mid and distal OM  . Depression   . Diabetes mellitus    Type II  . Hallucinations    "long history of them"  . Headache(784.0)   . History of gout   . HTN (hypertension)   . Insomnia   . Myocardial infarction ([place]) 2017  . Neuropathy   . Pain management   . Polysubstance abuse ([place])   . Right leg pain    chronic  . Schizophrenia ([place])   . Seizures ([place])    last sz between July 5-9th, 2016; epilepsy  . Shortness of breath dyspnea    with exertion  . Sleep apnea   . Transfusion of blood product refused for religious reason    Past Surgical History:  Procedure Laterality Date  . ANTERIOR CERVICAL DECOMP/DISCECTOMY FUSION N/A 12/14/2015   Procedure: ANTERIOR CERVICAL DECOMPRESSION/DISCECTOMY FUSION CERVICAL FIVE -SIX;  Surgeon: Earnie Larsson, MD;  Location: [place]  [facility];  Service: Neurosurgery;  Laterality: N/A;  . BIOPSY  05/07/2015   Procedure: BIOPSY (Gastric);  Surgeon: [name], MD;  Location: [facility];  Service: Endoscopy;;  . CARDIAC CATHETERIZATION N/A 03/07/2016   Procedure: Left Heart Cath and Coronary Angiography;  Surgeon: Belva Crome, MD;  Location: [place] [facility];  Service: Cardiovascular;  Laterality: N/A;  . COLONOSCOPY WITH PROPOFOL N/A 05/07/2015   SEG:[id number] diverticulosis, multiple colon polyps removed, tubular adenoma, serrated colon polyp. Next colonoscopy October 2019  . COLONOSCOPY WITH PROPOFOL N/A 08/02/2018   Procedure: COLONOSCOPY WITH PROPOFOL;  Surgeon: [name], MD;  Location: [facility];  Service: Endoscopy;  Laterality: N/A;  12:00pm  . CORONARY ARTERY BYPASS GRAFT N/A 03/28/2016   Procedure: CORONARY ARTERY BYPASS GRAFTING (CABG) x 5 USING GREATER SAPHENOUS VEIN;  Surgeon: [name], MD;  Location: [place] OR;  Service: Open Heart Surgery;  Laterality: N/A;  . ENDOVEIN HARVEST OF GREATER SAPHENOUS VEIN Right 03/28/2016   Procedure: ENDOVEIN HARVEST OF GREATER SAPHENOUS VEIN;  Surgeon: [name], MD;  Location: [place];  Service: Open Heart Surgery;  Laterality: Right;  . ESOPHAGEAL DILATION N/A 05/07/2015   Procedure: ESOPHAGEAL DILATION  WITH 56FR [place];  Surgeon: [name], MD;  Location: [facility];  Service: Endoscopy;  Laterality: N/A;  . ESOPHAGOGASTRODUODENOSCOPY (EGD) WITH PROPOFOL N/A 05/07/2015   RMR: Status post dilation of normal esophagus. Gastritis.  Marland Kitchen KNEE SURGERY Left    arthroscopy  . MANDIBLE FRACTURE SURGERY    . POLYPECTOMY  05/07/2015   Procedure: POLYPECTOMY (Hepatic Flexure, Distal Transverse Colon, Rectal);  Surgeon: [name], MD;  Location: [facility];  Service: Endoscopy;;  . TEE WITHOUT CARDIOVERSION N/A 03/28/2016   Procedure: TRANSESOPHAGEAL ECHOCARDIOGRAM (TEE);  Surgeon: [name], MD;  Location: [facility];  Service: Open Heart Surgery;  Laterality: N/A;  .  TONSILLECTOMY     Allergies  Allergen Reactions  . Aspirin Swelling and Other (See Comments)    Facial swelling    No current facility-administered medications on file prior to encounter.   Current Outpatient Medications on File Prior to Encounter  Medication Sig Dispense Refill  . ACCU-CHEK GUIDE test strip USE AS DIRECTED TO TEST 4 TIMES DAILY. 200 strip 2  . albuterol (PROVENTIL HFA;VENTOLIN HFA) 108 (90 Base) MCG/ACT inhaler Inhale 2 puffs into the lungs every 4 (four) hours as needed for wheezing or shortness of breath.     Marland Kitchen amLODipine (NORVASC) 10 MG tablet Take 1 tablet (10 mg total) by mouth daily. 30 tablet 3  . baclofen (LIORESAL) 10 MG tablet Take 10 mg by mouth 2 (two) times daily.    . clopidogrel (PLAVIX) 75 MG tablet Take 75 mg by mouth daily.    . diazepam (VALIUM) 5 MG tablet Take 5 mg by mouth 2 (two) times daily as needed for anxiety.     . divalproex (DEPAKOTE) 500 MG DR tablet Take 3 tablets (1,500 mg total) by mouth 2 (two) times daily. 540 tablet 4  . escitalopram (LEXAPRO) 20 MG tablet Take 20 mg by mouth daily.     . insulin aspart (NOVOLOG) 100 UNIT/ML FlexPen Inject 8-14 Units into the skin 3 (three) times daily with meals. 15 mL 2  . insulin glargine (LANTUS SOLOSTAR) 100 UNIT/ML Solostar Pen Inject 30 Units into the skin at bedtime. 15 mL 0  . lamoTRIgine (LAMICTAL) 25 MG tablet Take 50 mg by mouth 2 (two) times daily.     Marland Kitchen lisinopril (ZESTRIL) 20 MG tablet Take 1 tablet (20 mg total) by mouth daily. 90 tablet 3  . Melatonin 10 MG CAPS Take 1 capsule by mouth at bedtime as needed (for sleep).     . nitroGLYCERIN (NITROSTAT) 0.4 MG SL tablet Place 1 tablet (0.4 mg total) under the tongue every 5 (five) minutes as needed for chest pain. 25 tablet 3  . oxyCODONE-acetaminophen (PERCOCET) 5-325 MG tablet Take 1-2 tablets by mouth every 4 (four) hours as needed for severe pain. 15 tablet 0  . paliperidone (INVEGA) 6 MG 24 hr tablet Take 12 mg by mouth daily.    .  prazosin (MINIPRESS) 2 MG capsule Take 2-4 mg by mouth at bedtime as needed (for sleep).     . ranolazine (RANEXA) 500 MG 12 hr tablet TAKE (1) TABLET BY MOUTH TWICE DAILY. (Patient taking differently: Take 500 mg by mouth 2 (two) times daily. ) 60 tablet 6  . risperiDONE (RISPERDAL) 3 MG tablet Take 3 mg by mouth at bedtime.     Social History   Socioeconomic History  . Marital status: Single    Spouse name: Not on file  . Number of children: Not on file  .  Years of education: 11th grade  . Highest education level: Not on file  Occupational History  . Occupation: unemployed    Fish farm manager: NOT EMPLOYED  Tobacco Use  . Smoking status: Current Every Day Smoker    Packs/day: 0.50    Years: 30.00    Pack years: 15.00    Types: Cigarettes  . Smokeless tobacco: Never Used  . Tobacco comment: 3 cigs per day  Vaping Use  . Vaping Use: Never used  Substance and Sexual Activity  . Alcohol use: No  . Drug use: Yes    Types: Marijuana  . Sexual activity: Not Currently  Other Topics Concern  . Not on file  Social History Narrative   Lives alone   Drinks a cup of coffee a day   Social Determinants of Health   Financial Resource Strain:   . Difficulty of Paying Living Expenses: Not on file  Food Insecurity:   . Worried About Charity fundraiser in the Last Year: Not on file  . Ran Out of Food in the Last Year: Not on file  Transportation Needs:   . Lack of Transportation (Medical): Not on file  . Lack of Transportation (Non-Medical): Not on file  Physical Activity:   . Days of Exercise per Week: Not on file  . Minutes of Exercise per Session: Not on file  Stress:   . Feeling of Stress : Not on file  Social Connections:   . Frequency of Communication with Friends and Family: Not on file  . Frequency of Social Gatherings with Friends and Family: Not on file  . Attends Religious Services: Not on file  . Active Member of Clubs or Organizations: Not on file  . Attends Theatre manager Meetings: Not on file  . Marital Status: Not on file  Intimate Partner Violence:   . Fear of Current or Ex-Partner: Not on file  . Emotionally Abused: Not on file  . Physically Abused: Not on file  . Sexually Abused: Not on file   Family History  Problem Relation Age of Onset  . Diabetes Father   . Hypertension Father   . Arthritis Other   . Diabetes Other   . Asthma Other   . Stroke Sister     OBJECTIVE:  Vitals:   06/16/20 1537 06/16/20 1539  BP: (!) 149/89   Pulse: (!) 59   Resp: 17   Temp: 98.9 F (37.2 C)   TempSrc: Oral   SpO2: 99%   Weight:  150 lb (68 kg)  Height:  5\' 7"  (1.702 m)     Physical Exam Vitals and nursing note reviewed.  Constitutional:      General: He is not in acute distress.    Appearance: Normal appearance. He is normal weight. He is not ill-appearing, toxic-appearing or diaphoretic.  Cardiovascular:     Rate and Rhythm: Normal rate and regular rhythm.     Pulses: Normal pulses.     Heart sounds: Normal heart sounds. No murmur heard.  No friction rub. No gallop.   Pulmonary:     Effort: Pulmonary effort is normal. No respiratory distress.     Breath sounds: Normal breath sounds. No stridor. No wheezing, rhonchi or rales.  Chest:     Chest wall: No tenderness.  Musculoskeletal:        General: Tenderness present.     Right foot: Normal.     Left foot: Tenderness present.     Comments: The left foot is  without any obvious asymmetry or deformity when compared to the right foot.  There there is no ecchymosis, open wound, lesion, warmth, swelling present.  Limited range of motion due to pain.  Neurovascular status intact.  Neurological:     Mental Status: He is alert and oriented to person, place, and time.     LABS:  No results found for this or any previous visit (from the past 24 hour(s)).   ASSESSMENT & PLAN:  1. Left foot pain     Meds ordered this encounter  Medications  . dexamethasone (DECADRON) injection 10  mg    Discharge instructions  Decadron IM was given in office He was advised to follow-up with foot doctor tomorrow as scheduled Take OTC Tylenol 500 mg as needed for pain She will return or go to ED if you develop any new or worsening of symptoms  Reviewed expectations re: course of current medical issues. Questions answered. Outlined signs and symptoms indicating need for more acute intervention. Patient verbalized understanding. After Visit Summary given.         Emerson Monte, [place] 06/16/20 1607

## 2020-06-16 NOTE — ED Triage Notes (Signed)
Pain to LT foot that radiates up his leg into his lower back. Pt has had surgery on this foot.

## 2020-06-16 NOTE — Telephone Encounter (Signed)
Called patient and he said that he did go to ER as instructed, was given a Cortisone injection and told to f/u with foot doctor, has an appt. 06/17/20.

## 2020-06-16 NOTE — Telephone Encounter (Signed)
Have him go to ER to be evaluated if his pain is worsening

## 2020-06-16 NOTE — Telephone Encounter (Signed)
Have him go to ER if it worsening

## 2020-06-16 NOTE — Discharge Instructions (Addendum)
Decadron IM was given in office He was advised to follow-up with foot doctor tomorrow as scheduled Take OTC Tylenol 500 mg as needed for pain She will return or go to ED if you develop any new or worsening of symptoms

## 2020-06-17 ENCOUNTER — Ambulatory Visit (INDEPENDENT_AMBULATORY_CARE_PROVIDER_SITE_OTHER): Payer: Medicaid Other | Admitting: Podiatry

## 2020-06-17 ENCOUNTER — Other Ambulatory Visit: Payer: Self-pay | Admitting: Cardiology

## 2020-06-17 ENCOUNTER — Other Ambulatory Visit: Payer: Self-pay

## 2020-06-17 ENCOUNTER — Other Ambulatory Visit: Payer: Self-pay | Admitting: Podiatry

## 2020-06-17 ENCOUNTER — Ambulatory Visit (INDEPENDENT_AMBULATORY_CARE_PROVIDER_SITE_OTHER): Payer: Medicaid Other

## 2020-06-17 ENCOUNTER — Other Ambulatory Visit: Payer: Self-pay | Admitting: Diagnostic Neuroimaging

## 2020-06-17 DIAGNOSIS — M7732 Calcaneal spur, left foot: Secondary | ICD-10-CM | POA: Diagnosis not present

## 2020-06-17 DIAGNOSIS — M722 Plantar fascial fibromatosis: Secondary | ICD-10-CM

## 2020-06-17 NOTE — Telephone Encounter (Signed)
Please advise 

## 2020-06-18 ENCOUNTER — Telehealth: Payer: Self-pay | Admitting: Podiatry

## 2020-06-18 NOTE — Telephone Encounter (Signed)
Patient called in stated he was told if he was still experiencing pain you would prescribe pain meds, patient requested pain meds, please advise

## 2020-06-19 ENCOUNTER — Encounter: Payer: Self-pay | Admitting: Podiatry

## 2020-06-19 ENCOUNTER — Telehealth: Payer: Self-pay | Admitting: Podiatry

## 2020-06-19 MED ORDER — OXYCODONE-ACETAMINOPHEN 10-325 MG PO TABS
1.0000 | ORAL_TABLET | Freq: Three times a day (TID) | ORAL | 0 refills | Status: AC | PRN
Start: 2020-06-19 — End: 2020-06-24

## 2020-06-19 NOTE — Addendum Note (Signed)
Addended by: Boneta Lucks on: 06/19/2020 10:18 AM   Modules accepted: Orders

## 2020-06-19 NOTE — Progress Notes (Signed)
Subjective:  Patient ID: Melvin Taylor, male    DOB: 1965-11-15,  MRN: [id number]  Chief Complaint  Patient presents with  . Foot Pain    pt states he has had foot pain near surgery area for about a month.     54 y.o. male presents with the above complaint.  Patient presents with complaint of left plantar central band plantar fasciitis.  Patient states started 2 months ago has been worsening since then.  Patient states likely due to cold.  He had surgery done by me for EPF and gastrocnemius recession which he said was not doing really well however started coming back again.  Is been to hospital twice to get injection pain.  He denies any other acute complaints.  He would like to know if there is anything else that can be done.  He would like to know if they should go back in the boot with injections.   Review of Systems: Negative except as noted in the HPI. Denies N/V/F/Ch.  Past Medical History:  Diagnosis Date  . Anxiety   . Bipolar 1 disorder ([place])   . Bone spur    left heel  . Cervical radiculopathy   . Chest pain   . Chronic back pain   . Chronic chest wall pain   . Chronic neck pain   . Coronary artery disease    a. s/p CABG in 03/2016 with LIMA-LAD, SVG-D1, SVG-RCA, and Seq SVG-mid and distal OM  . Depression   . Diabetes mellitus    Type II  . Hallucinations    "long history of them"  . Headache(784.0)   . History of gout   . HTN (hypertension)   . Insomnia   . Myocardial infarction ([place]) 2017  . Neuropathy   . Pain management   . Polysubstance abuse ([place])   . Right leg pain    chronic  . Schizophrenia ([place])   . Seizures ([place])    last sz between July 5-9th, 2016; epilepsy  . Shortness of breath dyspnea    with exertion  . Sleep apnea   . Transfusion of blood product refused for religious reason     Current Outpatient Medications:  .  ACCU-CHEK GUIDE test strip, USE AS DIRECTED TO TEST 4 TIMES DAILY., Disp: 200 strip, Rfl: 2 .  albuterol (PROVENTIL  HFA;VENTOLIN HFA) 108 (90 Base) MCG/ACT inhaler, Inhale 2 puffs into the lungs every 4 (four) hours as needed for wheezing or shortness of breath. , Disp: , Rfl:  .  amLODipine (NORVASC) 10 MG tablet, Take 1 tablet (10 mg total) by mouth daily., Disp: 30 tablet, Rfl: 3 .  baclofen (LIORESAL) 10 MG tablet, Take 10 mg by mouth 2 (two) times daily., Disp: , Rfl:  .  clopidogrel (PLAVIX) 75 MG tablet, Take 75 mg by mouth daily., Disp: , Rfl:  .  diazepam (VALIUM) 5 MG tablet, Take 5 mg by mouth 2 (two) times daily as needed for anxiety. , Disp: , Rfl:  .  divalproex (DEPAKOTE) 500 MG DR tablet, Take 3 tablets (1,500 mg total) by mouth 2 (two) times daily., Disp: 540 tablet, Rfl: 4 .  escitalopram (LEXAPRO) 20 MG tablet, Take 20 mg by mouth daily. , Disp: , Rfl:  .  insulin aspart (NOVOLOG) 100 UNIT/ML FlexPen, Inject 8-14 Units into the skin 3 (three) times daily with meals., Disp: 15 mL, Rfl: 2 .  insulin glargine (LANTUS SOLOSTAR) 100 UNIT/ML Solostar Pen, Inject 30 Units into the skin at  bedtime., Disp: 15 mL, Rfl: 0 .  lamoTRIgine (LAMICTAL) 25 MG tablet, Take 50 mg by mouth 2 (two) times daily. , Disp: , Rfl:  .  lisinopril (ZESTRIL) 20 MG tablet, Take 1 tablet (20 mg total) by mouth daily., Disp: 90 tablet, Rfl: 3 .  Melatonin 10 MG CAPS, Take 1 capsule by mouth at bedtime as needed (for sleep). , Disp: , Rfl:  .  oxyCODONE-acetaminophen (PERCOCET) 5-325 MG tablet, Take 1-2 tablets by mouth every 4 (four) hours as needed for severe pain., Disp: 15 tablet, Rfl: 0 .  paliperidone (INVEGA) 6 MG 24 hr tablet, Take 12 mg by mouth daily., Disp: , Rfl:  .  prazosin (MINIPRESS) 2 MG capsule, Take 2-4 mg by mouth at bedtime as needed (for sleep). , Disp: , Rfl:  .  risperiDONE (RISPERDAL) 3 MG tablet, Take 3 mg by mouth at bedtime., Disp: , Rfl:  .  nitroGLYCERIN (NITROSTAT) 0.4 MG SL tablet, Place 1 tablet (0.4 mg total) under the tongue every 5 (five) minutes as needed for chest pain., Disp: 25 tablet,  Rfl: 3 .  oxyCODONE-acetaminophen (PERCOCET) 10-325 MG tablet, Take 1 tablet by mouth every 8 (eight) hours as needed for up to 5 days for pain., Disp: 15 tablet, Rfl: 0 .  ranolazine (RANEXA) 500 MG 12 hr tablet, TAKE (1) TABLET BY MOUTH TWICE DAILY., Disp: 60 tablet, Rfl: 6  Social History   Tobacco Use  Smoking Status Current Every Day Smoker  . Packs/day: 0.50  . Years: 30.00  . Pack years: 15.00  . Types: Cigarettes  Smokeless Tobacco Never Used  Tobacco Comment   3 cigs per day    Allergies  Allergen Reactions  . Aspirin Swelling and Other (See Comments)    Facial swelling    Objective:  There were no vitals filed for this visit. There is no height or weight on file to calculate BMI. Constitutional Well developed. Well nourished.  Vascular Dorsalis pedis pulses palpable bilaterally. Posterior tibial pulses palpable bilaterally. Capillary refill normal to all digits.  No cyanosis or clubbing noted. Pedal hair growth normal.  Neurologic Normal speech. Oriented to person, place, and time. Epicritic sensation to light touch grossly present bilaterally.  Dermatologic Nails well groomed and normal in appearance. No open wounds. No skin lesions.  Orthopedic:  Pain on palpation to the left central band of plantar fasciitis.  Pain no pain at the calcaneal tubercle at the origin of plantar fascia.  Negative Silfverskiold test.  Previous surgical scars noted.   Radiographs: 3 views of skeletally mature adult left foot: Mild plantar heel spurring noted.  No other bony abnormalities identified. Assessment:   1. Plantar fasciitis   2. Heel spur, left    Plan:  Patient was evaluated and treated and all questions answered.  Left plantar fasciitis central band at the midfoot asked with underlying heel spur -The patient the etiology of plantar fasciitis and various treatment options were extensively discussed with the patient.  Given the amount of pain that is having I believe  will benefit from a steroid injection to help decrease acute inflammatory component associated pain.  Patient agrees with the plan like to proceed with a steroid injection.  I also asked him to place himself in the cam boot.  He has a cam boot at home that he will place himself in it. -A steroid injection was performed at left plantar fascia using 1% plain Lidocaine and 10 mg of Kenalog. This was well tolerated.  No follow-ups on file.

## 2020-06-19 NOTE — Telephone Encounter (Signed)
Patient called in requesting pain meds, please advise

## 2020-06-29 ENCOUNTER — Other Ambulatory Visit: Payer: Self-pay | Admitting: Podiatry

## 2020-06-29 ENCOUNTER — Telehealth: Payer: Self-pay | Admitting: Podiatry

## 2020-06-29 ENCOUNTER — Telehealth: Payer: Self-pay

## 2020-06-29 NOTE — Telephone Encounter (Signed)
Pt is requesting pain meds until his next appointment on 12/08

## 2020-06-29 NOTE — Telephone Encounter (Signed)
Pt called again today, requesting a refill on pain medication. Please advise.

## 2020-06-29 NOTE — Telephone Encounter (Signed)
Please advise 

## 2020-06-30 ENCOUNTER — Other Ambulatory Visit: Payer: Self-pay

## 2020-06-30 ENCOUNTER — Ambulatory Visit (INDEPENDENT_AMBULATORY_CARE_PROVIDER_SITE_OTHER): Payer: Medicaid Other | Admitting: "Endocrinology

## 2020-06-30 ENCOUNTER — Encounter: Payer: Self-pay | Admitting: "Endocrinology

## 2020-06-30 VITALS — BP 153/91 | HR 81 | Ht 67.0 in

## 2020-06-30 DIAGNOSIS — Z9119 Patient's noncompliance with other medical treatment and regimen: Secondary | ICD-10-CM | POA: Diagnosis not present

## 2020-06-30 DIAGNOSIS — E1159 Type 2 diabetes mellitus with other circulatory complications: Secondary | ICD-10-CM | POA: Diagnosis not present

## 2020-06-30 DIAGNOSIS — Z91199 Patient's noncompliance with other medical treatment and regimen due to unspecified reason: Secondary | ICD-10-CM

## 2020-06-30 DIAGNOSIS — F17200 Nicotine dependence, unspecified, uncomplicated: Secondary | ICD-10-CM

## 2020-06-30 DIAGNOSIS — I10 Essential (primary) hypertension: Secondary | ICD-10-CM

## 2020-06-30 DIAGNOSIS — E782 Mixed hyperlipidemia: Secondary | ICD-10-CM

## 2020-06-30 MED ORDER — OXYCODONE-ACETAMINOPHEN 5-325 MG PO TABS
1.0000 | ORAL_TABLET | ORAL | 0 refills | Status: DC | PRN
Start: 2020-06-30 — End: 2020-07-08

## 2020-06-30 MED ORDER — LANTUS SOLOSTAR 100 UNIT/ML ~~LOC~~ SOPN: 34.0000 [IU] | PEN_INJECTOR | Freq: Every day | SUBCUTANEOUS | 2 refills | Status: DC

## 2020-06-30 NOTE — Addendum Note (Signed)
Addended by: Boneta Lucks on: 06/30/2020 04:51 AM   Modules accepted: Orders

## 2020-06-30 NOTE — Progress Notes (Signed)
06/30/2020   Endocrinology follow-up note   Subjective:    Patient ID: Melvin Taylor, male    DOB: 11/24/1965, PCP [name], FNP   Past Medical History:  Diagnosis Date  . Anxiety   . Bipolar 1 disorder ([place])   . Bone spur    left heel  . Cervical radiculopathy   . Chest pain   . Chronic back pain   . Chronic chest wall pain   . Chronic neck pain   . Coronary artery disease    a. s/p CABG in 03/2016 with LIMA-LAD, SVG-D1, SVG-RCA, and Seq SVG-mid and distal OM  . Depression   . Diabetes mellitus    Type II  . Hallucinations    "long history of them"  . Headache(784.0)   . History of gout   . HTN (hypertension)   . Insomnia   . Myocardial infarction ([place]) 2017  . Neuropathy   . Pain management   . Polysubstance abuse ([place])   . Right leg pain    chronic  . Schizophrenia ([name])   . Seizures ([place])    last sz between July 5-9th, 2016; epilepsy  . Shortness of breath dyspnea    with exertion  . Sleep apnea   . Transfusion of blood product refused for religious reason    Past Surgical History:  Procedure Laterality Date  . ANTERIOR CERVICAL DECOMP/DISCECTOMY FUSION N/A 12/14/2015   Procedure: ANTERIOR CERVICAL DECOMPRESSION/DISCECTOMY FUSION CERVICAL FIVE -SIX;  Surgeon: Earnie Larsson, MD;  Location: [facility];  Service: Neurosurgery;  Laterality: N/A;  . BIOPSY  05/07/2015   Procedure: BIOPSY (Gastric);  Surgeon: [name], MD;  Location: [facility];  Service: Endoscopy;;  . CARDIAC CATHETERIZATION N/A 03/07/2016   Procedure: Left Heart Cath and Coronary Angiography;  Surgeon: Belva Crome, MD;  Location: [facility];  Service: Cardiovascular;  Laterality: N/A;  . COLONOSCOPY WITH PROPOFOL N/A 05/07/2015   IFO:YDXAJOINOM diverticulosis, multiple colon polyps removed, tubular adenoma, serrated colon polyp. Next colonoscopy October 2019  . COLONOSCOPY WITH PROPOFOL N/A 08/02/2018   Procedure: COLONOSCOPY WITH PROPOFOL;  Surgeon: [name], MD;   Location: [facility];  Service: Endoscopy;  Laterality: N/A;  12:00pm  . CORONARY ARTERY BYPASS GRAFT N/A 03/28/2016   Procedure: CORONARY ARTERY BYPASS GRAFTING (CABG) x 5 USING GREATER SAPHENOUS VEIN;  Surgeon: [name], MD;  Location: [facility];  Service: Open Heart Surgery;  Laterality: N/A;  . ENDOVEIN HARVEST OF GREATER SAPHENOUS VEIN Right 03/28/2016   Procedure: ENDOVEIN HARVEST OF GREATER SAPHENOUS VEIN;  Surgeon: [name], MD;  Location: [facility];  Service: Open Heart Surgery;  Laterality: Right;  . ESOPHAGEAL DILATION N/A 05/07/2015   Procedure: ESOPHAGEAL DILATION WITH 56FR MALONEY DILATOR;  Surgeon: [name], MD;  Location: [facility];  Service: Endoscopy;  Laterality: N/A;  . ESOPHAGOGASTRODUODENOSCOPY (EGD) WITH PROPOFOL N/A 05/07/2015   RMR: Status post dilation of normal esophagus. Gastritis.  Marland Kitchen KNEE SURGERY Left    arthroscopy  . MANDIBLE FRACTURE SURGERY    . POLYPECTOMY  05/07/2015   Procedure: POLYPECTOMY (Hepatic Flexure, Distal Transverse Colon, Rectal);  Surgeon: [name], MD;  Location: [facility];  Service: Endoscopy;;  . TEE WITHOUT CARDIOVERSION N/A 03/28/2016   Procedure: TRANSESOPHAGEAL ECHOCARDIOGRAM (TEE);  Surgeon: [name], MD;  Location: [facility];  Service: Open Heart Surgery;  Laterality: N/A;  . TONSILLECTOMY     Social History   Socioeconomic History  . Marital status: Single  Spouse name: Not on file  . Number of children: Not on file  . Years of education: 11th grade  . Highest education level: Not on file  Occupational History  . Occupation: unemployed    Fish farm manager: NOT EMPLOYED  Tobacco Use  . Smoking status: Current Every Day Smoker    Packs/day: 0.50    Years: 30.00    Pack years: 15.00    Types: Cigarettes  . Smokeless tobacco: Never Used  . Tobacco comment: 3 cigs per day  Vaping Use  . Vaping Use: Never used  Substance and Sexual Activity  . Alcohol use: No  . Drug use: Yes    Types: Marijuana  . Sexual activity: Not  Currently  Other Topics Concern  . Not on file  Social History Narrative   Lives alone   Drinks a cup of coffee a day   Social Determinants of Health   Financial Resource Strain:   . Difficulty of Paying Living Expenses: Not on file  Food Insecurity:   . Worried About Charity fundraiser in the Last Year: Not on file  . Ran Out of Food in the Last Year: Not on file  Transportation Needs:   . Lack of Transportation (Medical): Not on file  . Lack of Transportation (Non-Medical): Not on file  Physical Activity:   . Days of Exercise per Week: Not on file  . Minutes of Exercise per Session: Not on file  Stress:   . Feeling of Stress : Not on file  Social Connections:   . Frequency of Communication with Friends and Family: Not on file  . Frequency of Social Gatherings with Friends and Family: Not on file  . Attends Religious Services: Not on file  . Active Member of Clubs or Organizations: Not on file  . Attends Archivist Meetings: Not on file  . Marital Status: Not on file   Outpatient Encounter Medications as of 06/30/2020  Medication Sig  . ACCU-CHEK GUIDE test strip USE AS DIRECTED TO TEST 4 TIMES DAILY.  Marland Kitchen albuterol (PROVENTIL HFA;VENTOLIN HFA) 108 (90 Base) MCG/ACT inhaler Inhale 2 puffs into the lungs every 4 (four) hours as needed for wheezing or shortness of breath.   Marland Kitchen amLODipine (NORVASC) 10 MG tablet Take 1 tablet (10 mg total) by mouth daily.  . baclofen (LIORESAL) 10 MG tablet Take 10 mg by mouth 2 (two) times daily.  . clopidogrel (PLAVIX) 75 MG tablet Take 75 mg by mouth daily.  . diazepam (VALIUM) 5 MG tablet Take 5 mg by mouth 2 (two) times daily as needed for anxiety.   . divalproex (DEPAKOTE) 500 MG DR tablet Take 3 tablets (1,500 mg total) by mouth 2 (two) times daily.  Marland Kitchen escitalopram (LEXAPRO) 20 MG tablet Take 20 mg by mouth daily.   . insulin aspart (NOVOLOG) 100 UNIT/ML FlexPen Inject 8-14 Units into the skin 3 (three) times daily with meals.  .  insulin glargine (LANTUS SOLOSTAR) 100 UNIT/ML Solostar Pen Inject 34 Units into the skin at bedtime.  . lamoTRIgine (LAMICTAL) 25 MG tablet Take 50 mg by mouth 2 (two) times daily.   Marland Kitchen lisinopril (ZESTRIL) 20 MG tablet Take 1 tablet (20 mg total) by mouth daily.  . Melatonin 10 MG CAPS Take 1 capsule by mouth at bedtime as needed (for sleep).   . nitroGLYCERIN (NITROSTAT) 0.4 MG SL tablet Place 1 tablet (0.4 mg total) under the tongue every 5 (five) minutes as needed for chest pain.  Marland Kitchen oxyCODONE-acetaminophen (  PERCOCET) 5-325 MG tablet Take 1-2 tablets by mouth every 4 (four) hours as needed for severe pain.  Marland Kitchen oxyCODONE-acetaminophen (PERCOCET) 5-325 MG tablet Take 1-2 tablets by mouth every 4 (four) hours as needed for severe pain.  . paliperidone (INVEGA) 6 MG 24 hr tablet Take 12 mg by mouth daily.  . prazosin (MINIPRESS) 2 MG capsule Take 2-4 mg by mouth at bedtime as needed (for sleep).   . ranolazine (RANEXA) 500 MG 12 hr tablet TAKE (1) TABLET BY MOUTH TWICE DAILY.  Marland Kitchen risperiDONE (RISPERDAL) 3 MG tablet Take 3 mg by mouth at bedtime.  . [DISCONTINUED] insulin glargine (LANTUS SOLOSTAR) 100 UNIT/ML Solostar Pen Inject 30 Units into the skin at bedtime.   No facility-administered encounter medications on file as of 06/30/2020.   ALLERGIES: Allergies  Allergen Reactions  . Aspirin Swelling and Other (See Comments)    Facial swelling    VACCINATION STATUS: Immunization History  Administered Date(s) Administered  . Influenza,trivalent, recombinat, inj, PF 05/02/2015  . Moderna SARS-COVID-2 Vaccination 11/01/2019, 12/03/2019  . Tdap 01/18/2015    Diabetes He presents for his follow-up diabetic visit. He has type 2 diabetes mellitus. Onset time: He was diagnosed the approximate age of 65 years. His disease course has been worsening. There are no hypoglycemic associated symptoms. Pertinent negatives for hypoglycemia include no confusion, headaches, pallor or seizures. Associated  symptoms include polydipsia and polyuria. Pertinent negatives for diabetes include no chest pain, no fatigue, no polyphagia and no weakness. There are no hypoglycemic complications. Symptoms are worsening. Diabetic complications include heart disease. (He is seriously noncompliant patient. He is now diagnosed with coronary artery disease at [facility] since last visit. He is awaiting for his outpatient cardiology evaluation. This assay to be set for 03/24/2016.) Risk factors for coronary artery disease include diabetes mellitus, dyslipidemia, hypertension, male sex, tobacco exposure and sedentary lifestyle. Current diabetic treatment includes insulin injections. He is compliant with treatment none of the time. His weight is fluctuating minimally. He is following a generally unhealthy diet. When asked about meal planning, he reported none. He has had a previous visit with a dietitian. He never participates in exercise. His home blood glucose trend is increasing steadily. His breakfast blood glucose range is generally >200 mg/dl. His lunch blood glucose range is generally >200 mg/dl. His dinner blood glucose range is generally >200 mg/dl. His overall blood glucose range is >200 mg/dl. (He presents with his meter and logs showing persistently above target glycemic profile he did not use NovoLog properly.  He only uses a sliding scale, did not use a standing order of 8 units 3 times daily, his recent A1c was 10.4% increasing from 9.2%.  He did not document or report hypoglycemia  ) An ACE inhibitor/angiotensin II receptor blocker is not being taken.  Hyperlipidemia This is a chronic problem. The current episode started more than 1 year ago. Exacerbating diseases include diabetes. Pertinent negatives include no chest pain, myalgias or shortness of breath. Current antihyperlipidemic treatment includes statins. Risk factors for coronary artery disease include diabetes mellitus, dyslipidemia, hypertension, a  sedentary lifestyle and male sex.  Hypertension Pertinent negatives include no chest pain, headaches, neck pain, palpitations or shortness of breath. Risk factors for coronary artery disease include smoking/tobacco exposure, diabetes mellitus, dyslipidemia and male gender.     Review of systems  Constitutional: + Minimally fluctuating body weight,  current  Body mass index is 23.49 kg/m. , no fatigue, no subjective hyperthermia, no subjective hypothermia Eyes: no blurry vision, no  xerophthalmia ENT: no sore throat, no nodules palpated in throat, no dysphagia/odynophagia, no hoarseness Cardiovascular: no Chest Pain, no Shortness of Breath, no palpitations, no leg swelling Respiratory: ++ cough, no shortness of breath Gastrointestinal: no Nausea/Vomiting/Diarhhea Musculoskeletal: no muscle/joint aches Skin: no rashes, no hyperemia Neurological: no tremors, no numbness, no tingling, no dizziness Psychiatric: no depression, no anxiety    Objective:    BP (!) 153/91   Pulse 81   Ht 5\' 7"  (1.702 m)   BMI 23.49 kg/m   Wt Readings from Last 3 Encounters:  06/16/20 150 lb (68 kg)  06/11/20 149 lb 3.2 oz (67.7 kg)  05/13/20 149 lb (67.6 kg)      Physical Exam- Limited  Constitutional:  Body mass index is 23.49 kg/m. , not in acute distress, + dysphoric state of mind Eyes:  EOMI, no exophthalmos Neck: Supple Thyroid: No gross goiter Respiratory: Adequate breathing efforts Musculoskeletal: no gross deformities, strength intact in all four extremities, no gross restriction of joint movements Skin:  no rashes, no hyperemia Neurological: no tremor with outstretched hands, + reluctant affect.    Chemistry (most recent): Lab Results  Component Value Date   NA 141 02/12/2020   K 4.5 02/12/2020   CL 103 02/12/2020   CO2 29 02/12/2020   BUN 17 05/08/2020   CREATININE 1.5 (A) 05/08/2020   Diabetic Labs (most recent): Lab Results  Component Value Date   HGBA1C 10.4 05/08/2020    HGBA1C 9.2 (A) 02/19/2020   HGBA1C 7.4 (A) 10/08/2019   Lipid Panel     Component Value Date/Time   CHOL 301 (A) 05/08/2020 0000   CHOL 286 (H) 02/12/2020 1334   TRIG 174 (A) 05/08/2020 0000   HDL 101 (A) 05/08/2020 0000   HDL 118 02/12/2020 1334   CHOLHDL 2.0 03/22/2016 0853   VLDL 7 03/22/2016 0853   LDLCALC 169 05/08/2020 0000   LDLCALC 145 (H) 02/12/2020 1334    Assessment & Plan:   1. Uncontrolled type 2 diabetes mellitus with complication, with long-term current use of insulin (HCC)  - diabetes is  complicated by Coronary artery disease , CKD , chronic heavy smoking, noncompliance and patient remains at a high risk for more acute and chronic complications of diabetes which include CAD, CVA, CKD, retinopathy, and neuropathy. These are all discussed in detail with the patient.  He presents with his meter and logs showing persistently above target glycemic profile he did not use NovoLog properly.  He only uses a sliding scale, did not use a standing order of 8 units 3 times daily, his recent A1c was 10.4% increasing from 9.2%.  He did not document or report hypoglycemia   -I had a long discussion with him about treatment course of type 2 diabetes in the pathology behind its complications. - I have re-counseled the patient on diet management   by adopting a carbohydrate restricted / protein rich  Diet. - he  admits there is a room for improvement in his diet and drink choices. -  Suggestion is made for him to avoid simple carbohydrates  from his diet including Cakes, Sweet Desserts / Pastries, Ice Cream, Soda (diet and regular), Sweet Tea, Candies, Chips, Cookies, Sweet Pastries,  Store Bought Juices, Alcohol in Excess of  1-2 drinks a day, Artificial Sweeteners, Coffee Creamer, and "Sugar-free" Products. This will help patient to have stable blood glucose profile and potentially avoid unintended weight gain.  - Patient is advised to stick to a routine mealtimes to eat 3  meals  a  day and avoid unnecessary snacks ( to snack only to correct hypoglycemia).   - I have approached patient with the following individualized plan to manage diabetes and patient reluctantly accepts .  - He remains alarmingly noncompliant,  not engaged for proper monitoring of blood glucose for safe use of insulin.  He is approached to get committed.  --He is advised to increase Lantus to 34 units nightly, reviewed his NovoLog regimen and advised to continue  8   units 3 times daily AC for pre-meal blood glucose above 90 mg,  Plus specific correction for readings above 150 mg per DL, associated with strict monitoring of blood glucose 4 times a day before meals and at bedtime. -He is warned not to take insulin without proper monitoring of blood glucose. -He is encouraged to call clinic for blood glucose readings less than 70 or greater than 300x3.  - He is not a suitable candidate for incretin therapy nor metformin/SG LT 2 inhibitors . - Patient specific target  for A1c; LDL, HDL, Triglycerides, and  Waist Circumference were discussed in detail.  2) BP/HTN:  His blood pressure is not controlled to target. He is advised to continue lisinopril 20 mg p.o. daily, amlodipine 10 mg p.o. daily, hydrochlorothiazide 25 mg p.o. daily.  See below as far as smoking.    3)  Hyperlipidemia: His most recent lipid panel showed LDL at 75.  He will continue to benefit from statin intervention.  He is advised to continue atorvastatin 40 mg p.o. nightly.         4) Medical noncompliance: He has significant medical history of noncompliance, I advised him to stay focused on self-care.   5) Chronic Care/Health Maintenance:  -Patient  Is on Statin medications and encouraged to continue to follow up with Ophthalmology, Podiatrist at least yearly or according to recommendations, and advised to quit smoking. I have recommended yearly flu vaccine and pneumonia vaccination at least every 5 years; moderate intensity exercise  for up to 150 minutes weekly; and  sleep for at least 7 hours a day.  The patient was counseled on the dangers of tobacco use, and was advised to quit.  Reviewed strategies to maximize success, including removing cigarettes and smoking materials from environment.   He is advised to continue follow-up with his PMD for primary care needs.   He is advised to keep close follow-up with his PMD Dr. Legrand Rams.  - Time spent on this patient care encounter:  35 min, of which > 50% was spent in  counseling and the rest reviewing his blood glucose logs , discussing his hypoglycemia and hyperglycemia episodes, reviewing his current and  previous labs / studies  ( including abstraction from other facilities) and medications  doses and developing a  long term treatment plan and documenting his care.   Please refer to Patient Instructions for Blood Glucose Monitoring and Insulin/Medications Dosing Guide"  in media tab for additional information. Please  also refer to " Patient Self Inventory" in the Media  tab for reviewed elements of pertinent patient history.  Lendon Ka Bouffard participated in the discussions, expressed understanding, and voiced agreement with the above plans.  All questions were answered to his satisfaction. he is encouraged to contact clinic should he have any questions or concerns prior to his return visit.    Follow up plan: -Return in about 9 weeks (around 09/01/2020) for Bring Meter and Logs- A1c in Office, ABI in Office [place].  Glade Lloyd,  MD Phone: [phone]  Fax: [phone]   06/30/2020, 12:55 PM

## 2020-06-30 NOTE — Patient Instructions (Signed)

## 2020-07-08 ENCOUNTER — Ambulatory Visit (INDEPENDENT_AMBULATORY_CARE_PROVIDER_SITE_OTHER): Payer: Medicaid Other | Admitting: Podiatry

## 2020-07-08 ENCOUNTER — Other Ambulatory Visit: Payer: Self-pay

## 2020-07-08 ENCOUNTER — Ambulatory Visit (INDEPENDENT_AMBULATORY_CARE_PROVIDER_SITE_OTHER): Payer: Medicaid Other

## 2020-07-08 DIAGNOSIS — M79672 Pain in left foot: Secondary | ICD-10-CM | POA: Diagnosis not present

## 2020-07-08 DIAGNOSIS — M7732 Calcaneal spur, left foot: Secondary | ICD-10-CM | POA: Diagnosis not present

## 2020-07-08 DIAGNOSIS — M722 Plantar fascial fibromatosis: Secondary | ICD-10-CM

## 2020-07-08 MED ORDER — OXYCODONE-ACETAMINOPHEN 5-325 MG PO TABS
1.0000 | ORAL_TABLET | ORAL | 0 refills | Status: DC | PRN
Start: 2020-07-08 — End: 2020-09-14

## 2020-07-10 ENCOUNTER — Encounter: Payer: Self-pay | Admitting: Podiatry

## 2020-07-10 NOTE — Progress Notes (Signed)
Subjective:  Patient ID: Melvin Taylor, male    DOB: 26-Apr-1966,  MRN: [id number]  Chief Complaint  Patient presents with  . Foot Pain    left foot pain. PT stated when he walks he feels like he can feel a bone.    54 y.o. male presents with the above complaint.  Patient presents with a follow-up of left plantar central band plantar fasciitis.  Patient states the pain did not getting better with the steroid shot.  He would like to discuss next treatment plan including MRI with possible CV surgery.  She did he denies any other acute complaints   Review of Systems: Negative except as noted in the HPI. Denies N/V/F/Ch.  Past Medical History:  Diagnosis Date  . Anxiety   . Bipolar 1 disorder ([place])   . Bone spur    left heel  . Cervical radiculopathy   . Chest pain   . Chronic back pain   . Chronic chest wall pain   . Chronic neck pain   . Coronary artery disease    a. s/p CABG in 03/2016 with LIMA-LAD, SVG-D1, SVG-RCA, and Seq SVG-mid and distal OM  . Depression   . Diabetes mellitus    Type II  . Hallucinations    "long history of them"  . Headache(784.0)   . History of gout   . HTN (hypertension)   . Insomnia   . Myocardial infarction ([place]) 2017  . Neuropathy   . Pain management   . Polysubstance abuse ([place])   . Right leg pain    chronic  . Schizophrenia ([place])   . Seizures ([place])    last sz between July 5-9th, 2016; epilepsy  . Shortness of breath dyspnea    with exertion  . Sleep apnea   . Transfusion of blood product refused for religious reason     Current Outpatient Medications:  .  ACCU-CHEK GUIDE test strip, USE AS DIRECTED TO TEST 4 TIMES DAILY., Disp: 200 strip, Rfl: 2 .  albuterol (PROVENTIL HFA;VENTOLIN HFA) 108 (90 Base) MCG/ACT inhaler, Inhale 2 puffs into the lungs every 4 (four) hours as needed for wheezing or shortness of breath. , Disp: , Rfl:  .  amLODipine (NORVASC) 10 MG tablet, Take 1 tablet (10 mg total) by mouth daily., Disp: 30 tablet,  Rfl: 3 .  baclofen (LIORESAL) 10 MG tablet, Take 10 mg by mouth 2 (two) times daily., Disp: , Rfl:  .  clopidogrel (PLAVIX) 75 MG tablet, Take 75 mg by mouth daily., Disp: , Rfl:  .  diazepam (VALIUM) 5 MG tablet, Take 5 mg by mouth 2 (two) times daily as needed for anxiety. , Disp: , Rfl:  .  divalproex (DEPAKOTE) 500 MG DR tablet, Take 3 tablets (1,500 mg total) by mouth 2 (two) times daily., Disp: 540 tablet, Rfl: 4 .  escitalopram (LEXAPRO) 20 MG tablet, Take 20 mg by mouth daily. , Disp: , Rfl:  .  insulin aspart (NOVOLOG) 100 UNIT/ML FlexPen, Inject 8-14 Units into the skin 3 (three) times daily with meals., Disp: 15 mL, Rfl: 2 .  insulin glargine (LANTUS SOLOSTAR) 100 UNIT/ML Solostar Pen, Inject 34 Units into the skin at bedtime., Disp: 15 mL, Rfl: 2 .  lamoTRIgine (LAMICTAL) 25 MG tablet, Take 50 mg by mouth 2 (two) times daily. , Disp: , Rfl:  .  lisinopril (ZESTRIL) 20 MG tablet, Take 1 tablet (20 mg total) by mouth daily., Disp: 90 tablet, Rfl: 3 .  Melatonin 10 MG  CAPS, Take 1 capsule by mouth at bedtime as needed (for sleep). , Disp: , Rfl:  .  nitroGLYCERIN (NITROSTAT) 0.4 MG SL tablet, Place 1 tablet (0.4 mg total) under the tongue every 5 (five) minutes as needed for chest pain., Disp: 25 tablet, Rfl: 3 .  oxyCODONE-acetaminophen (PERCOCET) 5-325 MG tablet, Take 1-2 tablets by mouth every 4 (four) hours as needed for severe pain., Disp: 15 tablet, Rfl: 0 .  oxyCODONE-acetaminophen (PERCOCET) 5-325 MG tablet, Take 1-2 tablets by mouth every 4 (four) hours as needed for severe pain., Disp: 15 tablet, Rfl: 0 .  paliperidone (INVEGA) 6 MG 24 hr tablet, Take 12 mg by mouth daily., Disp: , Rfl:  .  prazosin (MINIPRESS) 2 MG capsule, Take 2-4 mg by mouth at bedtime as needed (for sleep). , Disp: , Rfl:  .  ranolazine (RANEXA) 500 MG 12 hr tablet, TAKE (1) TABLET BY MOUTH TWICE DAILY., Disp: 60 tablet, Rfl: 6 .  risperiDONE (RISPERDAL) 3 MG tablet, Take 3 mg by mouth at bedtime., Disp: ,  Rfl:   Social History   Tobacco Use  Smoking Status Current Every Day Smoker  . Packs/day: 0.50  . Years: 30.00  . Pack years: 15.00  . Types: Cigarettes  Smokeless Tobacco Never Used  Tobacco Comment   3 cigs per day    Allergies  Allergen Reactions  . Aspirin Swelling and Other (See Comments)    Facial swelling    Objective:  There were no vitals filed for this visit. There is no height or weight on file to calculate BMI. Constitutional Well developed. Well nourished.  Vascular Dorsalis pedis pulses palpable bilaterally. Posterior tibial pulses palpable bilaterally. Capillary refill normal to all digits.  No cyanosis or clubbing noted. Pedal hair growth normal.  Neurologic Normal speech. Oriented to person, place, and time. Epicritic sensation to light touch grossly present bilaterally.  Dermatologic Nails well groomed and normal in appearance. No open wounds. No skin lesions.  Orthopedic:  Pain on palpation to the left central band of plantar fasciitis.  Pain no pain at the calcaneal tubercle at the origin of plantar fascia.  Negative Silfverskiold test.  Previous surgical scars noted.   Radiographs: 3 views of skeletally mature adult left foot: Mild plantar heel spurring noted.  No other bony abnormalities identified. Assessment:   1. Left foot pain   2. Plantar fasciitis   3. Heel spur, left    Plan:  Patient was evaluated and treated and all questions answered.  Left plantar fasciitis central band at the midfoot asked with underlying heel spur -The patient the etiology of plantar fasciitis and various treatment options were extensively discussed with the patient.  Given the amount of pain that is having I believe will benefit from a steroid injection to help decrease acute inflammatory component associated pain.  He has been wearing the cam boot however it is not helping him take the pain off of the central midfoot of the arch.  I discussed with him that given  the amount of pain that is having he may benefit from an MRI evaluation.  Patient agrees with the plan like to proceed with the MRI -He will be scheduled to get an MRI of the left foot -Continue utilizing cam boot.  I also discussed with him shoe gear modification with power steps as well.  He will work on obtaining those.   No follow-ups on file.

## 2020-07-13 ENCOUNTER — Other Ambulatory Visit: Payer: Self-pay | Admitting: Podiatry

## 2020-07-13 ENCOUNTER — Telehealth: Payer: Self-pay | Admitting: Podiatry

## 2020-07-13 NOTE — Telephone Encounter (Signed)
Patient would like a refill on pain meds 

## 2020-07-13 NOTE — Telephone Encounter (Signed)
Please advise 

## 2020-07-14 ENCOUNTER — Telehealth: Payer: Self-pay | Admitting: Podiatry

## 2020-07-14 NOTE — Telephone Encounter (Signed)
Pt called and stated that [facility] has not called to set up an appointment with him. I did go ahead and give him the number to call since the referral was sent on 07/10/2020.

## 2020-07-15 ENCOUNTER — Other Ambulatory Visit: Payer: Self-pay | Admitting: Podiatry

## 2020-07-15 DIAGNOSIS — M722 Plantar fascial fibromatosis: Secondary | ICD-10-CM

## 2020-07-15 MED ORDER — OXYCODONE-ACETAMINOPHEN 5-325 MG PO TABS: 1.0000 | ORAL_TABLET | ORAL | 0 refills | Status: DC | PRN

## 2020-07-15 NOTE — Addendum Note (Signed)
Addended by: Boneta Lucks on: 07/15/2020 11:22 AM   Modules accepted: Orders

## 2020-07-17 ENCOUNTER — Other Ambulatory Visit: Payer: Self-pay | Admitting: Diagnostic Neuroimaging

## 2020-07-17 ENCOUNTER — Other Ambulatory Visit: Payer: Self-pay | Admitting: Cardiology

## 2020-07-22 ENCOUNTER — Other Ambulatory Visit: Payer: Self-pay

## 2020-07-22 ENCOUNTER — Ambulatory Visit
Admission: RE | Admit: 2020-07-22 | Discharge: 2020-07-22 | Disposition: A | Discharge status: Home / Self Care | Payer: Medicaid Other | Source: Ambulatory Visit | Attending: Podiatry | Admitting: Podiatry

## 2020-07-22 DIAGNOSIS — M79672 Pain in left foot: Secondary | ICD-10-CM

## 2020-07-27 ENCOUNTER — Other Ambulatory Visit: Payer: Self-pay | Admitting: Podiatry

## 2020-07-27 ENCOUNTER — Telehealth: Payer: Self-pay | Admitting: Podiatry

## 2020-07-27 MED ORDER — OXYCODONE-ACETAMINOPHEN 5-325 MG PO TABS: 1.0000 | ORAL_TABLET | ORAL | 0 refills | Status: DC | PRN

## 2020-07-27 NOTE — Telephone Encounter (Signed)
Patient has requested pain meds and always inquired about paperwork that's needed from you to obtain insoles, please advise

## 2020-07-27 NOTE — Telephone Encounter (Signed)
Thank you :)

## 2020-07-27 NOTE — Telephone Encounter (Signed)
Please advise 

## 2020-07-27 NOTE — Telephone Encounter (Signed)
Called patient and scheduled appointment with Raiford Noble for orthotics

## 2020-07-27 NOTE — Telephone Encounter (Signed)
I sent a different medication that may not require authorization.  Let me know

## 2020-07-27 NOTE — Telephone Encounter (Signed)
As for the power steps orthotics that he was supposed to get I already discussed it with an individual that called me about them and I told him that he needs it and is beneficial and it seems like they have may have approved it.  I am sending in the pain meds as well.

## 2020-08-03 ENCOUNTER — Other Ambulatory Visit: Payer: Self-pay | Admitting: Podiatry

## 2020-08-04 ENCOUNTER — Telehealth: Payer: Self-pay | Admitting: Podiatry

## 2020-08-04 NOTE — Telephone Encounter (Signed)
Patient wants a refill on his pain meds-- 

## 2020-08-05 ENCOUNTER — Ambulatory Visit (INDEPENDENT_AMBULATORY_CARE_PROVIDER_SITE_OTHER): Payer: Medicaid Other | Admitting: Podiatry

## 2020-08-05 ENCOUNTER — Other Ambulatory Visit: Payer: Self-pay

## 2020-08-05 ENCOUNTER — Encounter: Payer: Self-pay | Admitting: Podiatry

## 2020-08-05 DIAGNOSIS — M722 Plantar fascial fibromatosis: Secondary | ICD-10-CM

## 2020-08-05 DIAGNOSIS — Z01818 Encounter for other preprocedural examination: Secondary | ICD-10-CM

## 2020-08-05 MED ORDER — OXYCODONE-ACETAMINOPHEN 5-325 MG PO TABS: 1.0000 | ORAL_TABLET | ORAL | 0 refills | Status: DC | PRN

## 2020-08-05 NOTE — Progress Notes (Signed)
Subjective:  Patient ID: Melvin Taylor, male    DOB: 03-01-1966,  MRN: [id number]  Chief Complaint  Patient presents with  . Foot Pain    Pt stated that he is here for MRI results and that he still has a little bit of pain especially in the cooler air    55 y.o. male presents with the above complaint.  Patient presents with a follow-up of left plantar central band plantar fasciitis.  Patient states the pain did not getting better with the steroid shot.  He is here to discuss his MRI results and discuss surgery at this time.  He has failed all conservative treatment options   Review of Systems: Negative except as noted in the HPI. Denies N/V/F/Ch.  Past Medical History:  Diagnosis Date  . Anxiety   . Bipolar 1 disorder (HCC)   . Bone spur    left heel  . Cervical radiculopathy   . Chest pain   . Chronic back pain   . Chronic chest wall pain   . Chronic neck pain   . Coronary artery disease    a. s/p CABG in 03/2016 with LIMA-LAD, SVG-D1, SVG-RCA, and Seq SVG-mid and distal OM  . Depression   . Diabetes mellitus    Type II  . Hallucinations    "long history of them"  . Headache(784.0)   . History of gout   . HTN (hypertension)   . Insomnia   . Myocardial infarction (HCC) 2017  . Neuropathy   . Pain management   . Polysubstance abuse (HCC)   . Right leg pain    chronic  . Schizophrenia (HCC)   . Seizures (HCC)    last sz between July 5-9th, 2016; epilepsy  . Shortness of breath dyspnea    with exertion  . Sleep apnea   . Transfusion of blood product refused for religious reason     Current Outpatient Medications:  .  oxyCODONE-acetaminophen (PERCOCET) 5-325 MG tablet, Take 1-2 tablets by mouth every 4 (four) hours as needed for severe pain., Disp: 30 tablet, Rfl: 0 .  ACCU-CHEK GUIDE test strip, USE AS DIRECTED TO TEST 4 TIMES DAILY., Disp: 200 strip, Rfl: 2 .  albuterol (PROVENTIL HFA;VENTOLIN HFA) 108 (90 Base) MCG/ACT inhaler, Inhale 2 puffs into the lungs  every 4 (four) hours as needed for wheezing or shortness of breath. , Disp: , Rfl:  .  amLODipine (NORVASC) 10 MG tablet, Take 1 tablet (10 mg total) by mouth daily., Disp: 30 tablet, Rfl: 3 .  baclofen (LIORESAL) 10 MG tablet, Take 10 mg by mouth 2 (two) times daily., Disp: , Rfl:  .  clopidogrel (PLAVIX) 75 MG tablet, Take 75 mg by mouth daily., Disp: , Rfl:  .  diazepam (VALIUM) 5 MG tablet, Take 5 mg by mouth 2 (two) times daily as needed for anxiety. , Disp: , Rfl:  .  divalproex (DEPAKOTE) 500 MG DR tablet, Take 3 tablets (1,500 mg total) by mouth 2 (two) times daily., Disp: 540 tablet, Rfl: 4 .  escitalopram (LEXAPRO) 20 MG tablet, Take 20 mg by mouth daily. , Disp: , Rfl:  .  insulin aspart (NOVOLOG) 100 UNIT/ML FlexPen, Inject 8-14 Units into the skin 3 (three) times daily with meals., Disp: 15 mL, Rfl: 2 .  insulin glargine (LANTUS SOLOSTAR) 100 UNIT/ML Solostar Pen, Inject 34 Units into the skin at bedtime., Disp: 15 mL, Rfl: 2 .  lamoTRIgine (LAMICTAL) 25 MG tablet, Take 50 mg by mouth 2 (two)  times daily. , Disp: , Rfl:  .  lisinopril (ZESTRIL) 20 MG tablet, TAKE ONE TABLET BY MOUTH ONCE DAILY., Disp: 90 tablet, Rfl: 0 .  Melatonin 10 MG CAPS, Take 1 capsule by mouth at bedtime as needed (for sleep). , Disp: , Rfl:  .  nitroGLYCERIN (NITROSTAT) 0.4 MG SL tablet, Place 1 tablet (0.4 mg total) under the tongue every 5 (five) minutes as needed for chest pain., Disp: 25 tablet, Rfl: 3 .  oxyCODONE-acetaminophen (PERCOCET) 5-325 MG tablet, Take 1-2 tablets by mouth every 4 (four) hours as needed for severe pain., Disp: 15 tablet, Rfl: 0 .  oxyCODONE-acetaminophen (PERCOCET) 5-325 MG tablet, Take 1-2 tablets by mouth every 4 (four) hours as needed for severe pain., Disp: 15 tablet, Rfl: 0 .  oxyCODONE-acetaminophen (PERCOCET) 5-325 MG tablet, Take 1-2 tablets by mouth every 4 (four) hours as needed for severe pain., Disp: 30 tablet, Rfl: 0 .  paliperidone (INVEGA) 6 MG 24 hr tablet, Take 12 mg  by mouth daily., Disp: , Rfl:  .  prazosin (MINIPRESS) 2 MG capsule, Take 2-4 mg by mouth at bedtime as needed (for sleep). , Disp: , Rfl:  .  ranolazine (RANEXA) 500 MG 12 hr tablet, TAKE (1) TABLET BY MOUTH TWICE DAILY., Disp: 60 tablet, Rfl: 6 .  risperiDONE (RISPERDAL) 3 MG tablet, Take 3 mg by mouth at bedtime., Disp: , Rfl:   Social History   Tobacco Use  Smoking Status Current Every Day Smoker  . Packs/day: 0.50  . Years: 30.00  . Pack years: 15.00  . Types: Cigarettes  Smokeless Tobacco Never Used  Tobacco Comment   3 cigs per day    Allergies  Allergen Reactions  . Aspirin Swelling and Other (See Comments)    Facial swelling    Objective:  There were no vitals filed for this visit. There is no height or weight on file to calculate BMI. Constitutional Well developed. Well nourished.  Vascular Dorsalis pedis pulses palpable bilaterally. Posterior tibial pulses palpable bilaterally. Capillary refill normal to all digits.  No cyanosis or clubbing noted. Pedal hair growth normal.  Neurologic Normal speech. Oriented to person, place, and time. Epicritic sensation to light touch grossly present bilaterally.  Dermatologic Nails well groomed and normal in appearance. No open wounds. No skin lesions.  Orthopedic:  Pain on palpation to the left central band of plantar fasciitis.  Pain no pain at the calcaneal tubercle at the origin of plantar fascia.  Negative Silfverskiold test.  Previous surgical scars noted.   Radiographs: 3 views of skeletally mature adult left foot: Mild plantar heel spurring noted.  No other bony abnormalities identified. Assessment:   1. Plantar fasciitis of left foot   2. Preoperative examination    Plan:  Patient was evaluated and treated and all questions answered.  Left plantar fasciitis central band at the midfoot asked with underlying heel spur -The patient the etiology of plantar fasciitis and various treatment options were extensively  discussed with the patient.  -MRI was reviewed with the patient extensive detail appears that patient's plantar fasciitis recurred.  I discussed with him that given that his A1c is around 10% he is at a very high risk of developing wound complication.  I discussed with the patient that given that this is a recurrence of plantar fasciitis I believe patient would benefit from an open plantar fasciectomy however given that his A1c is not very ideal I believe we can attempt to redo endoscopic plantar fasciotomy as he has  healed up previously with the same A1c and the risk of wound complications much lower with smaller portals.  I discussed this with extensive detail with the patient patient would like to proceed with endoscopic plantar fasciotomy.  I also discussed with him that he still at high risk of wound complication with loss of leg/life he states understanding and he would like [place] to proceed as his pain is unbearable. -I discussed my surgical planning once again with same risk and complications discussed previously he will be weightbearing as tolerated in cam boot after surgery.  Patient states understanding -Informed surgical risk consent was reviewed and read aloud to the patient.  I reviewed the films.  I have discussed my findings with the patient in great detail.  I have discussed all risks including but not limited to infection, stiffness, scarring, limp, disability, deformity, damage to blood vessels and nerves, numbness, poor healing, need for braces, arthritis, chronic pain, amputation, death.  All benefits and realistic expectations discussed in great detail.  I have made no promises as to the outcome.  I have provided realistic expectations.  I have offered the patient a 2nd opinion, which they have declined and assured me they preferred to proceed despite the risks -A total of 33 minutes was spent in direct patient care as well as pre and post patient encounter activities.  This includes  documentation as well as reviewing patient chart for labs, imaging, past medical, surgical, social, and family history as documented in the EMR.  I have reviewed medication allergies as documented in EMR.  I discussed the etiology of condition and treatment options from conservative to surgical care.  All risks and benefit of the treatment course was discussed in detail.  All questions were answered and return appointment was discussed.  Since the visit completed in an ambulatory/outpatient setting, the patient and/or parent/guardian has been advised to contact the providers office for worsening condition and seek medical treatment and/or call 911 if the patient deems either is necessary.    No follow-ups on file.

## 2020-08-07 ENCOUNTER — Other Ambulatory Visit: Payer: Self-pay

## 2020-08-07 ENCOUNTER — Ambulatory Visit (INDEPENDENT_AMBULATORY_CARE_PROVIDER_SITE_OTHER): Payer: Medicaid Other | Admitting: Orthotics

## 2020-08-07 DIAGNOSIS — M722 Plantar fascial fibromatosis: Secondary | ICD-10-CM

## 2020-08-09 NOTE — Telephone Encounter (Signed)
Please advise 

## 2020-08-19 ENCOUNTER — Other Ambulatory Visit: Payer: Self-pay | Admitting: Podiatry

## 2020-08-19 ENCOUNTER — Telehealth: Payer: Self-pay | Admitting: Podiatry

## 2020-08-19 MED ORDER — OXYCODONE-ACETAMINOPHEN 5-325 MG PO TABS: 1.0000 | ORAL_TABLET | ORAL | 0 refills | Status: DC | PRN

## 2020-08-19 NOTE — Telephone Encounter (Signed)
Thanks, that's what I told patient.

## 2020-08-19 NOTE — Telephone Encounter (Signed)
Patient is requesting something for pain, having a shooting pain that goes from the leg to arm. He had surgery on Monday.

## 2020-08-19 NOTE — Telephone Encounter (Signed)
Patient called in requesting to change appointment for surgery, Wants to go in sooner stated he is experiencing pain and not sure if his meds will last to Monday. Patient stated he called yesterday and left message but didn't receive call back regarding surgery and has requested return call and refill on meds, I did however transfer patient over to [facility] to better assist with scheduling of surgery,  Please advise

## 2020-08-19 NOTE — Telephone Encounter (Signed)
Darrick Penna will have to see if we can get him in early

## 2020-08-20 NOTE — Telephone Encounter (Signed)
Melvin Taylor surgery is on Monday, 1/24/202. I spoke to him yesterday and he has left a message on the nurse line requesting strong pain meds. He stated that you told him that he could take 2 of the pain meds you gave him before but they don't seem to be helping and wants something stronger.

## 2020-08-21 MED ORDER — OXYCODONE-ACETAMINOPHEN 10-325 MG PO TABS: 1.0000 | ORAL_TABLET | Freq: Four times a day (QID) | ORAL | 0 refills | Status: AC | PRN

## 2020-08-21 NOTE — Addendum Note (Signed)
Addended by: Boneta Lucks on: 08/21/2020 07:38 AM   Modules accepted: Orders

## 2020-08-24 DIAGNOSIS — M722 Plantar fascial fibromatosis: Secondary | ICD-10-CM

## 2020-08-27 LAB — HEMOGLOBIN A1C: Hemoglobin A1C: 12.7

## 2020-08-27 LAB — COMPREHENSIVE METABOLIC PANEL
Calcium: 9 (ref 8.7–10.7)
GFR calc Af Amer: 51
GFR calc non Af Amer: 44

## 2020-08-27 LAB — LIPID PANEL
Cholesterol: 169 (ref 0–200)
HDL: 89 — AB (ref 35–70)
LDL Cholesterol: 64
Triglycerides: 89 (ref 40–160)

## 2020-08-27 LAB — BASIC METABOLIC PANEL
BUN: 16 (ref 4–21)
Creatinine: 1.7 — AB (ref 0.6–1.3)

## 2020-08-31 ENCOUNTER — Telehealth: Payer: Self-pay | Admitting: Podiatry

## 2020-08-31 MED ORDER — OXYCODONE-ACETAMINOPHEN 10-325 MG PO TABS: 1.0000 | ORAL_TABLET | ORAL | 0 refills | Status: DC | PRN

## 2020-08-31 NOTE — Telephone Encounter (Signed)
Patient has requested refill on pain meds, Please Advise 

## 2020-08-31 NOTE — Addendum Note (Signed)
Addended by: Boneta Lucks on: 08/31/2020 07:58 PM   Modules accepted: Orders

## 2020-09-02 ENCOUNTER — Other Ambulatory Visit: Payer: Self-pay

## 2020-09-02 ENCOUNTER — Ambulatory Visit: Payer: Medicaid Other | Admitting: "Endocrinology

## 2020-09-02 ENCOUNTER — Ambulatory Visit (INDEPENDENT_AMBULATORY_CARE_PROVIDER_SITE_OTHER): Payer: Medicaid Other | Admitting: Podiatry

## 2020-09-02 DIAGNOSIS — M722 Plantar fascial fibromatosis: Secondary | ICD-10-CM

## 2020-09-02 DIAGNOSIS — Z9889 Other specified postprocedural states: Secondary | ICD-10-CM

## 2020-09-03 ENCOUNTER — Ambulatory Visit: Payer: Medicaid Other | Admitting: "Endocrinology

## 2020-09-03 ENCOUNTER — Ambulatory Visit (INDEPENDENT_AMBULATORY_CARE_PROVIDER_SITE_OTHER): Payer: Medicaid Other | Admitting: Student

## 2020-09-03 ENCOUNTER — Telehealth: Payer: Self-pay | Admitting: Student

## 2020-09-03 ENCOUNTER — Encounter: Payer: Self-pay | Admitting: Podiatry

## 2020-09-03 ENCOUNTER — Encounter: Payer: Self-pay | Admitting: Student

## 2020-09-03 VITALS — BP 124/80 | HR 96 | Ht 67.0 in | Wt 150.0 lb

## 2020-09-03 DIAGNOSIS — I25708 Atherosclerosis of coronary artery bypass graft(s), unspecified, with other forms of angina pectoris: Secondary | ICD-10-CM

## 2020-09-03 DIAGNOSIS — I10 Essential (primary) hypertension: Secondary | ICD-10-CM | POA: Diagnosis not present

## 2020-09-03 DIAGNOSIS — Z720 Tobacco use: Secondary | ICD-10-CM

## 2020-09-03 DIAGNOSIS — E785 Hyperlipidemia, unspecified: Secondary | ICD-10-CM | POA: Diagnosis not present

## 2020-09-03 DIAGNOSIS — R079 Chest pain, unspecified: Secondary | ICD-10-CM

## 2020-09-03 MED ORDER — RANOLAZINE ER 1000 MG PO TB12: 1000.0000 mg | ORAL_TABLET | Freq: Two times a day (BID) | ORAL | 3 refills | Status: DC

## 2020-09-03 NOTE — Patient Instructions (Signed)
Medication Instructions:  INCREASE Renexa to 1000 mg TWICE daily  *If you need a refill on your cardiac medications before your next appointment, please call your pharmacy*   Lab Work: None Today  If you have labs (blood work) drawn today and your tests are completely normal, you will receive your results only by: Marland Kitchen MyChart Message (if you have MyChart) OR . A paper copy in the mail If you have any lab test that is abnormal or we need to change your treatment, we will call you to review the results.   Testing/Procedures: Your physician has requested that you have a lexiscan myoview. For further information please visit [email or web address]. Please follow instruction sheet, as given.   Cardiac Nuclear Scan A cardiac nuclear scan is a test that is done to check the flow of blood to your heart. It is done when you are resting and when you are exercising. The test looks for problems such as:  Not enough blood reaching a portion of the heart.  The heart muscle not working as it should. You may need this test if:  You have heart disease.  You have had lab results that are not normal.  You have had heart surgery or a balloon procedure to open up blocked arteries (angioplasty).  You have chest pain.  You have shortness of breath. In this test, a special dye (tracer) is put into your bloodstream. The tracer will travel to your heart. A camera will then take pictures of your heart to see how the tracer moves through your heart. This test is usually done at a hospital and takes 2-4 hours. Tell a doctor about:  Any allergies you have.  All medicines you are taking, including vitamins, herbs, eye drops, creams, and over-the-counter medicines.  Any problems you or family members have had with anesthetic medicines.  Any blood disorders you have.  Any surgeries you have had.  Any medical conditions you have.  Whether you are pregnant or may be pregnant. What are the  risks? Generally, this is a safe test. However, problems may occur, such as:  Serious chest pain and heart attack. This is only a risk if the stress portion of the test is done.  Rapid heartbeat.  A feeling of warmth in your chest. This feeling usually does not last long.  Allergic reaction to the tracer. What happens before the test?  Ask your doctor about changing or stopping your normal medicines. This is important.  Follow instructions from your doctor about what you cannot eat or drink.  Remove your jewelry on the day of the test. What happens during the test?  An IV tube will be inserted into one of your veins.  Your doctor will give you a small amount of tracer through the IV tube.  You will wait for 20-40 minutes while the tracer moves through your bloodstream.  Your heart will be monitored with an electrocardiogram (ECG).  You will lie down on an exam table.  Pictures of your heart will be taken for about 15-20 minutes.  You may also have a stress test. For this test, one of these things may be done: ? You will be asked to exercise on a treadmill or a stationary bike. ? You will be given medicines that will make your heart work harder. This is done if you are unable to exercise.  When blood flow to your heart has peaked, a tracer will again be given through the IV tube.  After 20-40 minutes,  you will get back on the exam table. More pictures will be taken of your heart.  Depending on the tracer that is used, more pictures may need to be taken 3-4 hours later.  Your IV tube will be removed when the test is over. The test may vary among doctors and hospitals. What happens after the test?  Ask your doctor: ? Whether you can return to your normal schedule, including diet, activities, and medicines. ? Whether you should drink more fluids. This will help to remove the tracer from your body. Drink enough fluid to keep your pee (urine) pale yellow.  Ask your doctor,  or the department that is doing the test: ? When will my results be ready? ? How will I get my results? Summary  A cardiac nuclear scan is a test that is done to check the flow of blood to your heart.  Tell your doctor whether you are pregnant or may be pregnant.  Before the test, ask your doctor about changing or stopping your normal medicines. This is important.  Ask your doctor whether you can return to your normal activities. You may be asked to drink more fluids. This information is not intended to replace advice given to you by your health care provider. Make sure you discuss any questions you have with your health care provider. Document Revised: 11/07/2018 Document Reviewed: 01/01/2018 Elsevier Patient Education  2021 [place]: At [facility], you and your health needs are our priority.  As part of our continuing mission to provide you with exceptional heart care, we have created designated Provider Care Teams.  These Care Teams include your primary Cardiologist (physician) and Advanced Practice Providers (APPs -  Physician Assistants and Nurse Practitioners) who all work together to provide you with the care you need, when you need it.  We recommend signing up for the patient portal called "MyChart".  Sign up information is provided on this After Visit Summary.  MyChart is used to connect with patients for Virtual Visits (Telemedicine).  Patients are able to view lab/test results, encounter notes, upcoming appointments, etc.  Non-urgent messages can be sent to your provider as well.   To learn more about what you can do with MyChart, go to [email or web address].    Your next appointment:   Please keep your scheduled follow up with Bernerd Pho, PA-C.

## 2020-09-03 NOTE — Progress Notes (Signed)
Cardiology Office Note    Date:  09/03/2020   ID:  Melvin Taylor, DOB 08-12-1965, MRN [id number]  PCP:  [name], FNP  Cardiologist: Kate Sable, MD (Inactive) --> needs to switch to new MD  Chief Complaint  Patient presents with  . Follow-up    Recent chest pain    History of Present Illness:    Melvin Taylor is a 55 y.o. male with past medical history of CAD (s/p CABG in 03/2016 with LIMA-LAD, SVG-D1, SVG-RCA, and Seq SVG-mid and distal OM), HTN, HLD, IDDM, OSA, Bipolar Disorder, Schizophrenia and tobacco use who presents to the office today for evaluation of chest pain.    He was last examined by Katina Dung, NP in 12/2019 and reported episodes of chest pain on exertion but denied any nausea, vomiting or diaphoresis. No further testing was ordered at that time given recent reassuring ED work-up and prior NST in 09/2019 being low risk.  In talking with the patient today, he initially tells me he "had a heart attack last night" as he had chest pain after becoming emotional and tearful secondary to stress. Says he felt chest pressure and was short of breath. Took SL NTG x1 and pain resolved. He had already called EMS and refused transport upon their arrival due to his pain having resolved. He denies any recurrent pain overnight or this morning. Says his episode last night resembled his prior MI's. Denies any pain or dyspnea prior to this over the past several months. No recent orthopnea, PND or lower extremity edema.   Past Medical History:  Diagnosis Date  . Anxiety   . Bipolar 1 disorder ([place])   . Bone spur    left heel  . Cervical radiculopathy   . Chest pain   . Chronic back pain   . Chronic chest wall pain   . Chronic neck pain   . Coronary artery disease    a. s/p CABG in 03/2016 with LIMA-LAD, SVG-D1, SVG-RCA, and Seq SVG-mid and distal OM  . Depression   . Diabetes mellitus    Type II  . Hallucinations    "long history of them"  . Headache(784.0)    . History of gout   . HTN (hypertension)   . Insomnia   . Myocardial infarction ([place]) 2017  . Neuropathy   . Pain management   . Polysubstance abuse ([place])   . Right leg pain    chronic  . Schizophrenia ([place])   . Seizures ([place])    last sz between July 5-9th, 2016; epilepsy  . Shortness of breath dyspnea    with exertion  . Sleep apnea   . Transfusion of blood product refused for religious reason     Past Surgical History:  Procedure Laterality Date  . ANTERIOR CERVICAL DECOMP/DISCECTOMY FUSION N/A 12/14/2015   Procedure: ANTERIOR CERVICAL DECOMPRESSION/DISCECTOMY FUSION CERVICAL FIVE -SIX;  Surgeon: Earnie Larsson, MD;  Location: [place] [facility];  Service: Neurosurgery;  Laterality: N/A;  . BIOPSY  05/07/2015   Procedure: BIOPSY (Gastric);  Surgeon: [name], MD;  Location: [facility];  Service: Endoscopy;;  . CARDIAC CATHETERIZATION N/A 03/07/2016   Procedure: Left Heart Cath and Coronary Angiography;  Surgeon: Belva Crome, MD;  Location: [facility];  Service: Cardiovascular;  Laterality: N/A;  . COLONOSCOPY WITH PROPOFOL N/A 05/07/2015   [id number] diverticulosis, multiple colon polyps removed, tubular adenoma, serrated colon polyp. Next colonoscopy October 2019  . COLONOSCOPY WITH PROPOFOL N/A 08/02/2018  Procedure: COLONOSCOPY WITH PROPOFOL;  Surgeon: [name], MD;  [facility];  Service: Endoscopy;  Laterality: N/A;  12:00pm  . CORONARY ARTERY BYPASS GRAFT N/A 03/28/2016   Procedure: CORONARY ARTERY BYPASS GRAFTING (CABG) x 5 USING GREATER SAPHENOUS VEIN;  Surgeon: [name], MD;  Location: [facility];  Service: Open Heart Surgery;  Laterality: N/A;  . ENDOVEIN HARVEST OF GREATER SAPHENOUS VEIN Right 03/28/2016   Procedure: ENDOVEIN HARVEST OF GREATER SAPHENOUS VEIN;  Surgeon: [name], MD;  Location: [place] [facility];  Service: Open Heart Surgery;  Laterality: Right;  . ESOPHAGEAL DILATION N/A 05/07/2015   Procedure: ESOPHAGEAL DILATION WITH 56FR MALONEY  DILATOR;  Surgeon: [name], MD;  Location: [facility];  Service: Endoscopy;  Laterality: N/A;  . ESOPHAGOGASTRODUODENOSCOPY (EGD) WITH PROPOFOL N/A 05/07/2015   RMR: Status post dilation of normal esophagus. Gastritis.  Marland Kitchen KNEE SURGERY Left    arthroscopy  . MANDIBLE FRACTURE SURGERY    . POLYPECTOMY  05/07/2015   Procedure: POLYPECTOMY (Hepatic Flexure, Distal Transverse Colon, Rectal);  Surgeon: [name], MD;  Location[facility];  Service: Endoscopy;;  . TEE WITHOUT CARDIOVERSION N/A 03/28/2016   Procedure: TRANSESOPHAGEAL ECHOCARDIOGRAM (TEE);  Surgeon: [name], MD;  Location: [facility];  Service: Open Heart Surgery;  Laterality: N/A;  . TONSILLECTOMY      Current Medications: Outpatient Medications Prior to Visit  Medication Sig Dispense Refill  . ACCU-CHEK GUIDE test strip USE AS DIRECTED TO TEST 4 TIMES DAILY. 200 strip 2  . albuterol (PROVENTIL HFA;VENTOLIN HFA) 108 (90 Base) MCG/ACT inhaler Inhale 2 puffs into the lungs every 4 (four) hours as needed for wheezing or shortness of breath.     Marland Kitchen amLODipine (NORVASC) 10 MG tablet Take 1 tablet (10 mg total) by mouth daily. 30 tablet 3  . baclofen (LIORESAL) 10 MG tablet Take 10 mg by mouth 2 (two) times daily.    . clopidogrel (PLAVIX) 75 MG tablet Take 75 mg by mouth daily.    . diazepam (VALIUM) 5 MG tablet Take 5 mg by mouth 2 (two) times daily as needed for anxiety.     . divalproex (DEPAKOTE) 500 MG DR tablet Take 3 tablets (1,500 mg total) by mouth 2 (two) times daily. 540 tablet 4  . escitalopram (LEXAPRO) 20 MG tablet Take 20 mg by mouth daily.     . insulin aspart (NOVOLOG) 100 UNIT/ML FlexPen Inject 8-14 Units into the skin 3 (three) times daily with meals. 15 mL 2  . insulin glargine (LANTUS SOLOSTAR) 100 UNIT/ML Solostar Pen Inject 34 Units into the skin at bedtime. 15 mL 2  . lamoTRIgine (LAMICTAL) 25 MG tablet Take 50 mg by mouth 2 (two) times daily.     Marland Kitchen lisinopril (ZESTRIL) 20 MG tablet TAKE ONE TABLET BY MOUTH  ONCE DAILY. 90 tablet 0  . Melatonin 10 MG CAPS Take 1 capsule by mouth at bedtime as needed (for sleep).     Marland Kitchen oxyCODONE-acetaminophen (PERCOCET) 10-325 MG tablet Take 1 tablet by mouth every 4 (four) hours as needed for pain. 30 tablet 0  . oxyCODONE-acetaminophen (PERCOCET) 5-325 MG tablet Take 1-2 tablets by mouth every 4 (four) hours as needed for severe pain. 15 tablet 0  . oxyCODONE-acetaminophen (PERCOCET) 5-325 MG tablet Take 1-2 tablets by mouth every 4 (four) hours as needed for severe pain. 15 tablet 0  . oxyCODONE-acetaminophen (PERCOCET) 5-325 MG tablet Take 1-2 tablets by mouth every 4 (four) hours as needed for severe  pain. 30 tablet 0  . oxyCODONE-acetaminophen (PERCOCET) 5-325 MG tablet Take 1-2 tablets by mouth every 4 (four) hours as needed for severe pain. 30 tablet 0  . paliperidone (INVEGA) 6 MG 24 hr tablet Take 12 mg by mouth daily.    . prazosin (MINIPRESS) 2 MG capsule Take 2-4 mg by mouth at bedtime as needed (for sleep).     . risperiDONE (RISPERDAL) 3 MG tablet Take 3 mg by mouth at bedtime.    . ranolazine (RANEXA) 500 MG 12 hr tablet TAKE (1) TABLET BY MOUTH TWICE DAILY. 60 tablet 6  . nitroGLYCERIN (NITROSTAT) 0.4 MG SL tablet Place 1 tablet (0.4 mg total) under the tongue every 5 (five) minutes as needed for chest pain. 25 tablet 3   No facility-administered medications prior to visit.     Allergies:   Aspirin   Social History   Socioeconomic History  . Marital status: Single    Spouse name: Not on file  . Number of children: Not on file  . Years of education: 11th grade  . Highest education level: Not on file  Occupational History  . Occupation: unemployed    Fish farm manager: NOT EMPLOYED  Tobacco Use  . Smoking status: Current Every Day Smoker    Packs/day: 0.50    Years: 30.00    Pack years: 15.00    Types: Cigarettes  . Smokeless tobacco: Never Used  . Tobacco comment: 3 cigs per day  Vaping Use  . Vaping Use: Never used  Substance and Sexual  Activity  . Alcohol use: No  . Drug use: Yes    Types: Marijuana  . Sexual activity: Not Currently  Other Topics Concern  . Not on file  Social History Narrative   Lives alone   Drinks a cup of coffee a day   Social Determinants of Health   Financial Resource Strain: Not on file  Food Insecurity: Not on file  Transportation Needs: Not on file  Physical Activity: Not on file  Stress: Not on file  Social Connections: Not on file     Family History:  The patient's family history includes Arthritis in an other family member; Asthma in an other family member; Diabetes in his father and another family member; Hypertension in his father; Stroke in his sister.   Review of Systems:   Please see the history of present illness.     General:  No chills, fever, night sweats or weight changes.  Cardiovascular:  No dyspnea on exertion, edema, orthopnea, palpitations, paroxysmal nocturnal dyspnea. Positive for chest pain.  Dermatological: No rash, lesions/masses Respiratory: No cough, dyspnea Urologic: No hematuria, dysuria Abdominal:   No nausea, vomiting, diarrhea, bright red blood per rectum, melena, or hematemesis Neurologic:  No visual changes, wkns, changes in mental status. All other systems reviewed and are otherwise negative except as noted above.   Physical Exam:    VS:  BP 124/80   Pulse 96   Ht 5\' 7"  (1.702 m)   Wt 150 lb (68 kg)   SpO2 99%   BMI 23.49 kg/m    General: Well developed, well nourished,male appearing in no acute distress. Head: Normocephalic, atraumatic. Neck: No carotid bruits. JVD not elevated.  Lungs: Respirations regular and unlabored, without wheezes or rales.  Heart: Regular rate and rhythm. No S3 or S4.  No murmur, no rubs, or gallops appreciated. Abdomen: Appears non-distended. No obvious abdominal masses. Msk:  Strength and tone appear normal for age. No obvious joint deformities or effusions.  Extremities: No clubbing or cyanosis. No edema. Left  foot in boot.  Neuro: Alert and oriented X 3. Moves all extremities spontaneously. No focal deficits noted. Psych:  Responds to questions appropriately with a normal affect. Skin: No rashes or lesions noted  Wt Readings from Last 3 Encounters:  09/03/20 150 lb (68 kg)  06/16/20 150 lb (68 kg)  06/11/20 149 lb 3.2 oz (67.7 kg)     Studies/Labs Reviewed:   EKG:  EKG is ordered today.  The ekg ordered today demonstrates NSR, HR 65 with LAD. No acute ST changes when compared to prior tracings.   Recent Labs: 01/14/2020: Hemoglobin 13.9; Platelets 200 02/12/2020: ALT 13; Potassium 4.5; Sodium 141 05/08/2020: BUN 17; Creatinine 1.5; TSH 0.55   Lipid Panel    Component Value Date/Time   CHOL 301 (A) 05/07/2020 0000   CHOL 286 (H) 02/12/2020 1334   TRIG 174 (A) 05/07/2020 0000   HDL 101 (A) 05/07/2020 0000   HDL 118 02/12/2020 1334   CHOLHDL 2.0 03/22/2016 0853   VLDL 7 03/22/2016 0853   LDLCALC 169 05/07/2020 0000   LDLCALC 145 (H) 02/12/2020 1334    Additional studies/ records that were reviewed today include:   Echocardiogram: 03/2019 IMPRESSIONS    1. The left ventricle has normal systolic function with an ejection  fraction of 60-65%. The cavity size was normal. Mild to moderate  concentric LVH. Left ventricular diastolic Doppler parameters are  indeterminate. No evidence of left ventricular  regional wall motion abnormalities.  2. Right atrial size was mildly dilated.  3. The mitral valve is grossly normal. Mild thickening of the mitral  valve leaflet.  4. The tricuspid valve is grossly normal.  5. The aortic valve is tricuspid.  6. The aorta is normal in size and structure.   NST: 09/2019  There was no ST segment deviation noted during stress.  The study is normal. There are no perfusion defects  This is a low risk study.  The left ventricular ejection fraction is low normal (50%).     Assessment:    1. Coronary artery disease involving coronary  bypass graft of native heart with other forms of angina pectoris (HCC)   2. Chest pain of uncertain etiology   3. Essential hypertension   4. Hyperlipidemia LDL goal <70   5. Tobacco use      Plan:   In order of problems listed above:  1. CAD/Chest Pain - He is s/p CABG in 03/2016 with LIMA-LAD, SVG-D1, SVG-RCA, and Seq SVG-mid and distal OM.  - His episode of chest pain last night seems atypical for angina given that it occurred in the setting of emotional stress but he does report it resembled his prior MI and also improved with SL NTG. Reviewed options with the patient and will plan for a repeat Lexiscan Myoview for ischemic evaluation. He is unable to walk on a treadmill as he recently underwent foot surgery.  - Will continue Plavix 75mg  daily (has remained on this due to allergy to ASA) and plan to titrate Ranexa from 500mg  BID to 1000mg  BID. He was previously on Atorvastatin 40mg  daily but this is no longer on his medication list. Will need to verify and add back if not taking.   2. HTN - BP is well-controlled at 124/80 during today's visit. Continue current medication regimen with Amlodipine 10mg  daily and Lisinopril 20mg  daily.   3. HLD - He was previously on Atorvastatin 40mg  daily with LDL at 78 in 2019.  LDL was elevated to 169 in 05/2020. Will plan to verify with the patient at the time of his stress test if taking and if not, would restart given his CAD and Type 2 DM.   4. Tobacco Use - He continues to smoke ~ 0.5 ppd. Cessation advised.    Shared Decision Making/Informed Consent:   Shared Decision Making/Informed Consent The risks [chest pain, shortness of breath, cardiac arrhythmias, dizziness, blood pressure fluctuations, myocardial infarction, stroke/transient ischemic attack, nausea, vomiting, allergic reaction, radiation exposure, metallic taste sensation and life-threatening complications (estimated to be 1 in 10,000)], benefits (risk stratification, diagnosing  coronary artery disease, treatment guidance) and alternatives of a nuclear stress test were discussed in detail with Melvin Taylor and he agrees to proceed.       Medication Adjustments/Labs and Tests Ordered: Current medicines are reviewed at length with the patient today.  Concerns regarding medicines are outlined above.  Medication changes, Labs and Tests ordered today are listed in the Patient Instructions below. Patient Instructions  Medication Instructions:  INCREASE Renexa to 1000 mg TWICE daily  *If you need a refill on your cardiac medications before your next appointment, please call your pharmacy*   Lab Work: None Today  If you have labs (blood work) drawn today and your tests are completely normal, you will receive your results only by: Marland Kitchen MyChart Message (if you have MyChart) OR . A paper copy in the mail If you have any lab test that is abnormal or we need to change your treatment, we will call you to review the results.   Testing/Procedures: Your physician has requested that you have a lexiscan myoview. For further information please visit [email or web address]. Please follow instruction sheet, as given.   Cardiac Nuclear Scan A cardiac nuclear scan is a test that is done to check the flow of blood to your heart. It is done when you are resting and when you are exercising. The test looks for problems such as:  Not enough blood reaching a portion of the heart.  The heart muscle not working as it should. You may need this test if:  You have heart disease.  You have had lab results that are not normal.  You have had heart surgery or a balloon procedure to open up blocked arteries (angioplasty).  You have chest pain.  You have shortness of breath. In this test, a special dye (tracer) is put into your bloodstream. The tracer will travel to your heart. A camera will then take pictures of your heart to see how the tracer moves through your heart. This test is usually  done at a hospital and takes 2-4 hours. Tell a doctor about:  Any allergies you have.  All medicines you are taking, including vitamins, herbs, eye drops, creams, and over-the-counter medicines.  Any problems you or family members have had with anesthetic medicines.  Any blood disorders you have.  Any surgeries you have had.  Any medical conditions you have.  Whether you are pregnant or may be pregnant. What are the risks? Generally, this is a safe test. However, problems may occur, such as:  Serious chest pain and heart attack. This is only a risk if the stress portion of the test is done.  Rapid heartbeat.  A feeling of warmth in your chest. This feeling usually does not last long.  Allergic reaction to the tracer. What happens before the test?  Ask your doctor about changing or stopping your normal medicines. This is important.  Follow instructions  from your doctor about what you cannot eat or drink.  Remove your jewelry on the day of the test. What happens during the test?  An IV tube will be inserted into one of your veins.  Your doctor will give you a small amount of tracer through the IV tube.  You will wait for 20-40 minutes while the tracer moves through your bloodstream.  Your heart will be monitored with an electrocardiogram (ECG).  You will lie down on an exam table.  Pictures of your heart will be taken for about 15-20 minutes.  You may also have a stress test. For this test, one of these things may be done: ? You will be asked to exercise on a treadmill or a stationary bike. ? You will be given medicines that will make your heart work harder. This is done if you are unable to exercise.  When blood flow to your heart has peaked, a tracer will again be given through the IV tube.  After 20-40 minutes, you will get back on the exam table. More pictures will be taken of your heart.  Depending on the tracer that is used, more pictures may need to be  taken 3-4 hours later.  Your IV tube will be removed when the test is over. The test may vary among doctors and hospitals. What happens after the test?  Ask your doctor: ? Whether you can return to your normal schedule, including diet, activities, and medicines. ? Whether you should drink more fluids. This will help to remove the tracer from your body. Drink enough fluid to keep your pee (urine) pale yellow.  Ask your doctor, or the department that is doing the test: ? When will my results be ready? ? How will I get my results? Summary  A cardiac nuclear scan is a test that is done to check the flow of blood to your heart.  Tell your doctor whether you are pregnant or may be pregnant.  Before the test, ask your doctor about changing or stopping your normal medicines. This is important.  Ask your doctor whether you can return to your normal activities. You may be asked to drink more fluids. This information is not intended to replace advice given to you by your health care provider. Make sure you discuss any questions you have with your health care provider. Document Revised: 11/07/2018 Document Reviewed: 01/01/2018 Elsevier Patient Education  2021 [place]: At [facility], you and your health needs are our priority.  As part of our continuing mission to provide you with exceptional heart care, we have created designated Provider Care Teams.  These Care Teams include your primary Cardiologist (physician) and Advanced Practice Providers (APPs -  Physician Assistants and Nurse Practitioners) who all work together to provide you with the care you need, when you need it.  We recommend signing up for the patient portal called "MyChart".  Sign up information is provided on this After Visit Summary.  MyChart is used to connect with patients for Virtual Visits (Telemedicine).  Patients are able to view lab/test results, encounter notes, upcoming appointments, etc.   Non-urgent messages can be sent to your provider as well.   To learn more about what you can do with MyChart, go to [email or web address].    Your next appointment:   Please keep your scheduled follow up with Bernerd Pho, PA-C.      Signed, Erma Heritage, PA-C  09/03/2020 7:29 PM    [facility] [address] [place], [place] [address] Phone: [phone] Fax: [phone]

## 2020-09-03 NOTE — Telephone Encounter (Signed)
Spoke with pt who states that on last night he had to call EMS for chest pain and SOB. Pt stated that at that time "It felt like my heart was jumping out of my chest". Pt reports that he at that time took one nitro and the pain was relieved. Pt states he was feeling better and refused to be transported to the hospital. He reports that this felt like he did when he had his last heart attack.  Pt denies chest pain, nausea and SOB at this time. Pt informed that he needs to evaluated in the ER. Pt would like providers advice first.

## 2020-09-03 NOTE — Telephone Encounter (Signed)
New message    Patient had a heart attack last night , he was checked out by EMS , he refused to be transported to hospital , but they said he had a heart attack . Patient would like call back .

## 2020-09-03 NOTE — Progress Notes (Signed)
Subjective:  Patient ID: Melvin Taylor, male    DOB: 29-Jul-1966,  MRN: [id number]  Chief Complaint  Patient presents with  . Post-op Problem  . Routine Post Op    POV #1 DOS 08/24/2020 EPF LT     55 y.o. male returns for post-op check.  He is doing well.  He denies any other acute complaints.  He still has tenderness and pain to it.  He has been ambulating with a cam boot.  He has been taking his pain medication.  Review of Systems: Negative except as noted in the HPI. Denies N/V/F/Ch.  Past Medical History:  Diagnosis Date  . Anxiety   . Bipolar 1 disorder ([place])   . Bone spur    left heel  . Cervical radiculopathy   . Chest pain   . Chronic back pain   . Chronic chest wall pain   . Chronic neck pain   . Coronary artery disease    a. s/p CABG in 03/2016 with LIMA-LAD, SVG-D1, SVG-RCA, and Seq SVG-mid and distal OM  . Depression   . Diabetes mellitus    Type II  . Hallucinations    "long history of them"  . Headache(784.0)   . History of gout   . HTN (hypertension)   . Insomnia   . Myocardial infarction ([place]) 2017  . Neuropathy   . Pain management   . Polysubstance abuse ([place])   . Right leg pain    chronic  . Schizophrenia ([place])   . Seizures ([place])    last sz between July 5-9th, 2016; epilepsy  . Shortness of breath dyspnea    with exertion  . Sleep apnea   . Transfusion of blood product refused for religious reason     Current Outpatient Medications:  .  ACCU-CHEK GUIDE test strip, USE AS DIRECTED TO TEST 4 TIMES DAILY., Disp: 200 strip, Rfl: 2 .  albuterol (PROVENTIL HFA;VENTOLIN HFA) 108 (90 Base) MCG/ACT inhaler, Inhale 2 puffs into the lungs every 4 (four) hours as needed for wheezing or shortness of breath. , Disp: , Rfl:  .  amLODipine (NORVASC) 10 MG tablet, Take 1 tablet (10 mg total) by mouth daily., Disp: 30 tablet, Rfl: 3 .  baclofen (LIORESAL) 10 MG tablet, Take 10 mg by mouth 2 (two) times daily., Disp: , Rfl:  .  clopidogrel (PLAVIX) 75 MG  tablet, Take 75 mg by mouth daily., Disp: , Rfl:  .  diazepam (VALIUM) 5 MG tablet, Take 5 mg by mouth 2 (two) times daily as needed for anxiety. , Disp: , Rfl:  .  divalproex (DEPAKOTE) 500 MG DR tablet, Take 3 tablets (1,500 mg total) by mouth 2 (two) times daily., Disp: 540 tablet, Rfl: 4 .  escitalopram (LEXAPRO) 20 MG tablet, Take 20 mg by mouth daily. , Disp: , Rfl:  .  insulin aspart (NOVOLOG) 100 UNIT/ML FlexPen, Inject 8-14 Units into the skin 3 (three) times daily with meals., Disp: 15 mL, Rfl: 2 .  insulin glargine (LANTUS SOLOSTAR) 100 UNIT/ML Solostar Pen, Inject 34 Units into the skin at bedtime., Disp: 15 mL, Rfl: 2 .  lamoTRIgine (LAMICTAL) 25 MG tablet, Take 50 mg by mouth 2 (two) times daily. , Disp: , Rfl:  .  lisinopril (ZESTRIL) 20 MG tablet, TAKE ONE TABLET BY MOUTH ONCE DAILY., Disp: 90 tablet, Rfl: 0 .  Melatonin 10 MG CAPS, Take 1 capsule by mouth at bedtime as needed (for sleep). , Disp: , Rfl:  .  nitroGLYCERIN (  NITROSTAT) 0.4 MG SL tablet, Place 1 tablet (0.4 mg total) under the tongue every 5 (five) minutes as needed for chest pain., Disp: 25 tablet, Rfl: 3 .  oxyCODONE-acetaminophen (PERCOCET) 10-325 MG tablet, Take 1 tablet by mouth every 4 (four) hours as needed for pain., Disp: 30 tablet, Rfl: 0 .  oxyCODONE-acetaminophen (PERCOCET) 5-325 MG tablet, Take 1-2 tablets by mouth every 4 (four) hours as needed for severe pain., Disp: 15 tablet, Rfl: 0 .  oxyCODONE-acetaminophen (PERCOCET) 5-325 MG tablet, Take 1-2 tablets by mouth every 4 (four) hours as needed for severe pain., Disp: 15 tablet, Rfl: 0 .  oxyCODONE-acetaminophen (PERCOCET) 5-325 MG tablet, Take 1-2 tablets by mouth every 4 (four) hours as needed for severe pain., Disp: 30 tablet, Rfl: 0 .  oxyCODONE-acetaminophen (PERCOCET) 5-325 MG tablet, Take 1-2 tablets by mouth every 4 (four) hours as needed for severe pain., Disp: 30 tablet, Rfl: 0 .  paliperidone (INVEGA) 6 MG 24 hr tablet, Take 12 mg by mouth  daily., Disp: , Rfl:  .  prazosin (MINIPRESS) 2 MG capsule, Take 2-4 mg by mouth at bedtime as needed (for sleep). , Disp: , Rfl:  .  ranolazine (RANEXA) 500 MG 12 hr tablet, TAKE (1) TABLET BY MOUTH TWICE DAILY., Disp: 60 tablet, Rfl: 6 .  risperiDONE (RISPERDAL) 3 MG tablet, Take 3 mg by mouth at bedtime., Disp: , Rfl:   Social History   Tobacco Use  Smoking Status Current Every Day Smoker  . Packs/day: 0.50  . Years: 30.00  . Pack years: 15.00  . Types: Cigarettes  Smokeless Tobacco Never Used  Tobacco Comment   3 cigs per day    Allergies  Allergen Reactions  . Aspirin Swelling and Other (See Comments)    Facial swelling    Objective:  There were no vitals filed for this visit. There is no height or weight on file to calculate BMI. Constitutional Well developed. Well nourished.  Vascular Foot warm and well perfused. Capillary refill normal to all digits.   Neurologic Normal speech. Oriented to person, place, and time. Epicritic sensation to light touch grossly present bilaterally.  Dermatologic Skin healing well without signs of infection. Skin edges well coapted without signs of infection.  Orthopedic: Tenderness to palpation noted about the surgical site.   Radiographs: None Assessment:   1. Plantar fasciitis of left foot   2. Status post foot surgery    Plan:  Patient was evaluated and treated and all questions answered.  S/p foot surgery left -Progressing as expected post-operatively. -XR: None -WB Status: Weightbearing as tolerated in cam boot -Sutures: Intact.  No signs of dehiscence noted.  No clinical signs of infection noted. -Medications: None -Foot redressed.  No follow-ups on file.

## 2020-09-03 NOTE — Telephone Encounter (Signed)
Pt notified and msg sent to scheduling for sooner appt.

## 2020-09-03 NOTE — Telephone Encounter (Signed)
I reviewed the chart.  Former patient of Dr. Bronson Ing that I have not met yet.  He has a known history of CAD, last ischemic testing was in March of last year and normal at that time.  If this was a single episode of chest pain that resolved with a single nitroglycerin, we might be able to have him seen earlier in the office to discuss either medication adjustments or follow-up ischemic testing.  If he is having worsening chest pain symptoms on stable medical regimen, I would agree that ER assessment would make the most sense.  He is scheduled to see [place] in March, would try to move this visit up if possible.

## 2020-09-07 ENCOUNTER — Telehealth: Payer: Self-pay | Admitting: Podiatry

## 2020-09-07 MED ORDER — OXYCODONE-ACETAMINOPHEN 10-325 MG PO TABS: 1.0000 | ORAL_TABLET | ORAL | 0 refills | Status: DC | PRN

## 2020-09-07 NOTE — Addendum Note (Signed)
Addended by: Boneta Lucks on: 09/07/2020 09:21 AM   Modules accepted: Orders

## 2020-09-07 NOTE — Telephone Encounter (Signed)
Patient has requested refill on pain meds, Please Advise 

## 2020-09-09 ENCOUNTER — Ambulatory Visit (INDEPENDENT_AMBULATORY_CARE_PROVIDER_SITE_OTHER): Payer: Medicaid Other | Admitting: "Endocrinology

## 2020-09-09 ENCOUNTER — Encounter: Payer: Self-pay | Admitting: "Endocrinology

## 2020-09-09 ENCOUNTER — Other Ambulatory Visit: Payer: Self-pay

## 2020-09-09 VITALS — BP 138/78 | HR 64 | Ht 67.0 in | Wt 150.4 lb

## 2020-09-09 DIAGNOSIS — F17200 Nicotine dependence, unspecified, uncomplicated: Secondary | ICD-10-CM | POA: Diagnosis not present

## 2020-09-09 DIAGNOSIS — I10 Essential (primary) hypertension: Secondary | ICD-10-CM

## 2020-09-09 DIAGNOSIS — E1159 Type 2 diabetes mellitus with other circulatory complications: Secondary | ICD-10-CM | POA: Diagnosis not present

## 2020-09-09 DIAGNOSIS — E782 Mixed hyperlipidemia: Secondary | ICD-10-CM

## 2020-09-09 MED ORDER — ACCU-CHEK GUIDE VI STRP: ORAL_STRIP | 2 refills | Status: DC

## 2020-09-09 MED ORDER — LANTUS SOLOSTAR 100 UNIT/ML ~~LOC~~ SOPN: 36.0000 [IU] | PEN_INJECTOR | Freq: Every day | SUBCUTANEOUS | 1 refills | Status: DC

## 2020-09-09 MED ORDER — INSULIN ASPART 100 UNIT/ML FLEXPEN: 4.0000 [IU] | PEN_INJECTOR | Freq: Three times a day (TID) | SUBCUTANEOUS | 1 refills | Status: DC

## 2020-09-09 NOTE — Progress Notes (Signed)
09/09/2020   Endocrinology follow-up note   Subjective:    Patient ID: Melvin Taylor, male    DOB: [birth year], PCP [name], FNP   Past Medical History:  Diagnosis Date  . Anxiety   . Bipolar 1 disorder ([place])   . Bone spur    left heel  . Cervical radiculopathy   . Chest pain   . Chronic back pain   . Chronic chest wall pain   . Chronic neck pain   . Coronary artery disease    a. s/p CABG in 03/2016 with LIMA-LAD, SVG-D1, SVG-RCA, and Seq SVG-mid and distal OM  . Depression   . Diabetes mellitus    Type II  . Hallucinations    "long history of them"  . Headache(784.0)   . History of gout   . HTN (hypertension)   . Insomnia   . Myocardial infarction ([place]) 2017  . Neuropathy   . Pain management   . Polysubstance abuse ([place])   . Right leg pain    chronic  . Schizophrenia ([place])   . Seizures ([place])    last sz between July 5-9th, 2016; epilepsy  . Shortness of breath dyspnea    with exertion  . Sleep apnea   . Transfusion of blood product refused for religious reason    Past Surgical History:  Procedure Laterality Date  . ANTERIOR CERVICAL DECOMP/DISCECTOMY FUSION N/A 12/14/2015   Procedure: ANTERIOR CERVICAL DECOMPRESSION/DISCECTOMY FUSION CERVICAL FIVE -SIX;  Surgeon: Earnie Larsson, MD;  Location: [facility];  Service: Neurosurgery;  Laterality: N/A;  . BIOPSY  05/07/2015   Procedure: BIOPSY (Gastric);  Surgeon: [name], MD;  Location: [facility];  Service: Endoscopy;;  . CARDIAC CATHETERIZATION N/A 03/07/2016   Procedure: Left Heart Cath and Coronary Angiography;  Surgeon: Belva Crome, MD;  Location: [facility];  Service: Cardiovascular;  Laterality: N/A;  . COLONOSCOPY WITH PROPOFOL N/A 05/07/2015   QIO:[id number] diverticulosis, multiple colon polyps removed, tubular adenoma, serrated colon polyp. Next colonoscopy October 2019  . COLONOSCOPY WITH PROPOFOL N/A 08/02/2018   Procedure: COLONOSCOPY WITH PROPOFOL;  Surgeon: [name], MD;   Location: [facility];  Service: Endoscopy;  Laterality: N/A;  12:00pm  . CORONARY ARTERY BYPASS GRAFT N/A 03/28/2016   Procedure: CORONARY ARTERY BYPASS GRAFTING (CABG) x 5 USING GREATER SAPHENOUS VEIN;  Surgeon: [name], MD;  Location: [facility];  Service: Open Heart Surgery;  Laterality: N/A;  . ENDOVEIN HARVEST OF GREATER SAPHENOUS VEIN Right 03/28/2016   Procedure: ENDOVEIN HARVEST OF GREATER SAPHENOUS VEIN;  Surgeon: [name], MD;  Location: [facility];  Service: Open Heart Surgery;  Laterality: Right;  . ESOPHAGEAL DILATION N/A 05/07/2015   Procedure: ESOPHAGEAL DILATION WITH 56FR MALONEY DILATOR;  Surgeon: [name], MD;  Location: [facility];  Service: Endoscopy;  Laterality: N/A;  . ESOPHAGOGASTRODUODENOSCOPY (EGD) WITH PROPOFOL N/A 05/07/2015   RMR: Status post dilation of normal esophagus. Gastritis.  Marland Kitchen KNEE SURGERY Left    arthroscopy  . MANDIBLE FRACTURE SURGERY    . POLYPECTOMY  05/07/2015   Procedure: POLYPECTOMY (Hepatic Flexure, Distal Transverse Colon, Rectal);  Surgeon: [name], MD;  Location: [facility];  Service: Endoscopy;;  . TEE WITHOUT CARDIOVERSION N/A 03/28/2016   Procedure: TRANSESOPHAGEAL ECHOCARDIOGRAM (TEE);  Surgeon: [name], MD;  Location: [facility];  Service: Open Heart Surgery;  Laterality: N/A;  . TONSILLECTOMY     Social History   Socioeconomic History  . Marital status: Single  Spouse name: Not on file  . Number of children: Not on file  . Years of education: 11th grade  . Highest education level: Not on file  Occupational History  . Occupation: unemployed    Fish farm manager: NOT EMPLOYED  Tobacco Use  . Smoking status: Current Every Day Smoker    Packs/day: 0.50    Years: 30.00    Pack years: 15.00    Types: Cigarettes  . Smokeless tobacco: Never Used  . Tobacco comment: 3 cigs per day  Vaping Use  . Vaping Use: Never used  Substance and Sexual Activity  . Alcohol use: No  . Drug use: Yes    Types: Marijuana  . Sexual activity: Not  Currently  Other Topics Concern  . Not on file  Social History Narrative   Lives alone   Drinks a cup of coffee a day   Social Determinants of Health   Financial Resource Strain: Not on file  Food Insecurity: Not on file  Transportation Needs: Not on file  Physical Activity: Not on file  Stress: Not on file  Social Connections: Not on file   Outpatient Encounter Medications as of 09/09/2020  Medication Sig  . albuterol (PROVENTIL HFA;VENTOLIN HFA) 108 (90 Base) MCG/ACT inhaler Inhale 2 puffs into the lungs every 4 (four) hours as needed for wheezing or shortness of breath.   Marland Kitchen amLODipine (NORVASC) 10 MG tablet Take 1 tablet (10 mg total) by mouth daily.  . baclofen (LIORESAL) 10 MG tablet Take 10 mg by mouth 2 (two) times daily.  . clopidogrel (PLAVIX) 75 MG tablet Take 75 mg by mouth daily.  . diazepam (VALIUM) 5 MG tablet Take 5 mg by mouth 2 (two) times daily as needed for anxiety.   . divalproex (DEPAKOTE) 500 MG DR tablet Take 3 tablets (1,500 mg total) by mouth 2 (two) times daily.  Marland Kitchen escitalopram (LEXAPRO) 20 MG tablet Take 20 mg by mouth daily.   Marland Kitchen glucose blood (ACCU-CHEK GUIDE) test strip Use to check glucose 4 times a day  . insulin aspart (NOVOLOG) 100 UNIT/ML FlexPen Inject 4-10 Units into the skin 3 (three) times daily with meals.  . insulin glargine (LANTUS SOLOSTAR) 100 UNIT/ML Solostar Pen Inject 36 Units into the skin at bedtime.  . lamoTRIgine (LAMICTAL) 25 MG tablet Take 50 mg by mouth 2 (two) times daily.   Marland Kitchen lisinopril (ZESTRIL) 20 MG tablet TAKE ONE TABLET BY MOUTH ONCE DAILY.  . Melatonin 10 MG CAPS Take 1 capsule by mouth at bedtime as needed (for sleep).   . nitroGLYCERIN (NITROSTAT) 0.4 MG SL tablet Place 1 tablet (0.4 mg total) under the tongue every 5 (five) minutes as needed for chest pain.  Marland Kitchen oxyCODONE-acetaminophen (PERCOCET) 10-325 MG tablet Take 1 tablet by mouth every 4 (four) hours as needed for pain.  Marland Kitchen oxyCODONE-acetaminophen (PERCOCET) 10-325 MG  tablet Take 1 tablet by mouth every 4 (four) hours as needed for pain.  Marland Kitchen oxyCODONE-acetaminophen (PERCOCET) 5-325 MG tablet Take 1-2 tablets by mouth every 4 (four) hours as needed for severe pain.  Marland Kitchen oxyCODONE-acetaminophen (PERCOCET) 5-325 MG tablet Take 1-2 tablets by mouth every 4 (four) hours as needed for severe pain.  Marland Kitchen oxyCODONE-acetaminophen (PERCOCET) 5-325 MG tablet Take 1-2 tablets by mouth every 4 (four) hours as needed for severe pain.  Marland Kitchen oxyCODONE-acetaminophen (PERCOCET) 5-325 MG tablet Take 1-2 tablets by mouth every 4 (four) hours as needed for severe pain.  . paliperidone (INVEGA) 6 MG 24 hr tablet Take 12 mg by  mouth daily.  . prazosin (MINIPRESS) 2 MG capsule Take 2-4 mg by mouth at bedtime as needed (for sleep).   . ranolazine (RANEXA) 1000 MG SR tablet Take 1 tablet (1,000 mg total) by mouth 2 (two) times daily.  . risperiDONE (RISPERDAL) 3 MG tablet Take 3 mg by mouth at bedtime.  . [DISCONTINUED] ACCU-CHEK GUIDE test strip USE AS DIRECTED TO TEST 4 TIMES DAILY.  . [DISCONTINUED] insulin aspart (NOVOLOG) 100 UNIT/ML FlexPen Inject 8-14 Units into the skin 3 (three) times daily with meals.  . [DISCONTINUED] insulin glargine (LANTUS SOLOSTAR) 100 UNIT/ML Solostar Pen Inject 34 Units into the skin at bedtime.   No facility-administered encounter medications on file as of 09/09/2020.   ALLERGIES: Allergies  Allergen Reactions  . Aspirin Swelling and Other (See Comments)    Facial swelling    VACCINATION STATUS: Immunization History  Administered Date(s) Administered  . Influenza,trivalent, recombinat, inj, PF 05/02/2015  . Moderna Sars-Covid-2 Vaccination 11/01/2019, 12/03/2019  . Tdap 01/18/2015    Diabetes He presents for his follow-up diabetic visit. He has type 2 diabetes mellitus. Onset time: He was diagnosed the approximate age of 72 years. His disease course has been worsening. There are no hypoglycemic associated symptoms. Pertinent negatives for hypoglycemia  include no confusion, headaches, pallor or seizures. Associated symptoms include polydipsia and polyuria. Pertinent negatives for diabetes include no chest pain, no fatigue, no polyphagia and no weakness. There are no hypoglycemic complications. Symptoms are worsening. Diabetic complications include heart disease. (He is seriously noncompliant patient. He is now diagnosed with coronary artery disease at [facility] since last visit. He is awaiting for his outpatient cardiology evaluation. This assay to be set for 03/24/2016.) Risk factors for coronary artery disease include diabetes mellitus, dyslipidemia, hypertension, male sex, tobacco exposure and sedentary lifestyle. Current diabetic treatment includes insulin injections. He is compliant with treatment none of the time. His weight is fluctuating minimally. He is following a generally unhealthy diet. When asked about meal planning, he reported none. He has had a previous visit with a dietitian. He never participates in exercise. His home blood glucose trend is increasing steadily. His breakfast blood glucose range is generally >200 mg/dl. His lunch blood glucose range is generally >200 mg/dl. His dinner blood glucose range is generally >200 mg/dl. His bedtime blood glucose range is generally >200 mg/dl. His overall blood glucose range is >200 mg/dl. (He presents with his meter and logs showing persistently above target glycemic profile. His average blood glucose is greater than 300 mg per DL. Despite detailed education and instruction last visit, he still did not use his NovoLog properly. He used only the correction part and  missed the standing order of 8 units 3 times daily. His previsit labs show A1c of 12.7% increasing from 10.4%. He worries about hypoglycemia, although he did not document any hypoglycemia in his logs nor meter.  ) An ACE inhibitor/angiotensin II receptor blocker is not being taken.  Hyperlipidemia This is a chronic problem. The  current episode started more than 1 year ago. Exacerbating diseases include diabetes. Pertinent negatives include no chest pain, myalgias or shortness of breath. Current antihyperlipidemic treatment includes statins. Risk factors for coronary artery disease include diabetes mellitus, dyslipidemia, hypertension, a sedentary lifestyle and male sex.  Hypertension Pertinent negatives include no chest pain, headaches, neck pain, palpitations or shortness of breath. Risk factors for coronary artery disease include smoking/tobacco exposure, diabetes mellitus, dyslipidemia and male gender.     Review of systems  Constitutional: + Minimally  fluctuating body weight,  current  Body mass index is 23.56 kg/m. , no fatigue, no subjective hyperthermia, no subjective hypothermia Musculoskeletal: He is status post left foot surgery, plantar fasciitis, bone spur removal    Objective:    BP 138/78   Pulse 64   Ht 5\' 7"  (1.702 m)   Wt 150 lb 6.4 oz (68.2 kg)   BMI 23.56 kg/m   Wt Readings from Last 3 Encounters:  09/09/20 150 lb 6.4 oz (68.2 kg)  09/03/20 150 lb (68 kg)  06/16/20 150 lb (68 kg)      Physical Exam- Limited  Constitutional:  Body mass index is 23.56 kg/m. , not in acute distress, + dysphoric state of mind Eyes:  EOMI, no exophthalmos Neck: Supple  Musculoskeletal: Left foot in surgical boot status post recent surgery for plantar fasciitis/bone spur removal.  Chemistry (most recent): Lab Results  Component Value Date   NA 141 02/12/2020   K 4.5 02/12/2020   CL 103 02/12/2020   CO2 29 02/12/2020   BUN 16 08/27/2020   CREATININE 1.7 (A) 08/27/2020   Diabetic Labs (most recent): Lab Results  Component Value Date   HGBA1C 12.7 08/27/2020   HGBA1C 10.4 05/08/2020   HGBA1C 9.2 (A) 02/19/2020   Lipid Panel     Component Value Date/Time   CHOL 169 08/27/2020 0000   CHOL 286 (H) 02/12/2020 1334   TRIG 89 08/27/2020 0000   HDL 89 (A) 08/27/2020 0000   HDL 118 02/12/2020  1334   CHOLHDL 2.0 03/22/2016 0853   VLDL 7 03/22/2016 0853   LDLCALC 64 08/27/2020 0000   LDLCALC 145 (H) 02/12/2020 1334    Assessment & Plan:   1. Uncontrolled type 2 diabetes mellitus with complication, with long-term current use of insulin (HCC)  - diabetes is  complicated by Coronary artery disease , CKD , chronic heavy smoking, noncompliance and patient remains at a high risk for more acute and chronic complications of diabetes which include CAD, CVA, CKD, retinopathy, and neuropathy. These are all discussed in detail with the patient.  He presents with his meter and logs showing persistently above target glycemic profile. Despite detailed education and instruction last visit, he still did not use his NovoLog properly. He used only the correction part and completely missed the standing order of 8 units 3 times daily. His previsit labs show A1c of 12.7% increasing from 10.4%. He worries about hypoglycemia, although he did not document any hypoglycemia in his logs nor meter.   -I had a long discussion with him about treatment course of type 2 diabetes in the pathology behind its complications. - I have re-counseled the patient on diet management   by adopting a carbohydrate restricted / protein rich  Diet.  - he acknowledges that there is a room for improvement in his food and drink choices. - Suggestion is made for him to avoid simple carbohydrates  from his diet including Cakes, Sweet Desserts, Ice Cream, Soda (diet and regular), Sweet Tea, Candies, Chips, Cookies, Store Bought Juices, Alcohol in Excess of  1-2 drinks a day, Artificial Sweeteners,  Coffee Creamer, and "Sugar-free" Products, Lemonade. This will help patient to have more stable blood glucose profile and potentially avoid unintended weight gain.   - Patient is advised to stick to a routine mealtimes to eat 3 meals  a day and avoid unnecessary snacks ( to snack only to correct hypoglycemia).   - I have approached patient  with the following individualized plan to  manage diabetes and patient reluctantly accepts .  - He remains alarmingly noncompliant, does not follow recommendations, not engaged for proper monitoring of blood glucose for safe use of insulin. He did not adapt routine mealtimes as recommended. He is approached to get committed, accepts with reluctance. He worries about hypoglycemia, however did not document any hypoglycemia. He does not want to take NovoLog 8 units 3 times daily AC. --He is advised to increase Lantus to 36 units nightly (he may need higher doses only if he eats breakfast 1 time), had a long discussion again about utilization of NovoLog. To get his confidence, I lowered his NovoLog to 4 units  3 times daily AC for pre-meal blood glucose between 90-150 mg/day, plus specific correction for readings above 150 mg per DL, associated with strict monitoring of blood glucose 4 times a day before meals and at bedtime. -He is warned not to take insulin without proper monitoring of blood glucose. -He is encouraged to call clinic for blood glucose readings less than 70 or greater than 300x3.  - He is not a suitable candidate for incretin therapy nor metformin/SG LT 2 inhibitors . - Patient specific target  for A1c; LDL, HDL, Triglycerides, and  Waist Circumference were discussed in detail.  2) BP/HTN:  His blood pressure is controlled to target. He is advised to continue lisinopril 20 mg p.o. daily, amlodipine 10 mg p.o. daily, hydrochlorothiazide 25 mg p.o. daily.  See below as far as smoking.    3)  Hyperlipidemia: His most recent lipid panel showed LDL at 75.  He will continue to benefit from statin intervention.  He is advised to continue atorvastatin 40 mg p.o. nightly.          4) Medical noncompliance: He has significant medical history of noncompliance, I advised him to stay focused on self-care.   5) Chronic Care/Health Maintenance:  -Patient  Is on Statin medications and encouraged to  continue to follow up with Ophthalmology, Podiatrist at least yearly or according to recommendations, and advised to quit smoking. I have recommended yearly flu vaccine and pneumonia vaccination at least every 5 years; moderate intensity exercise for up to 150 minutes weekly; and  sleep for at least 7 hours a day.  The patient was counseled on the dangers of tobacco use, and was advised to quit.  Reviewed strategies to maximize success, including removing cigarettes and smoking materials from environment. Not ready or willing to stop smoking at this time.    He is advised to continue follow-up with his PMD for primary care needs.  - Time spent on this patient care encounter:  35 min, of which > 50% was spent in  counseling and the rest reviewing his blood glucose logs , discussing his hypoglycemia and hyperglycemia episodes, reviewing his current and  previous labs / studies  ( including abstraction from other facilities) and medications  doses and developing a  long term treatment plan and documenting his care.   Please refer to Patient Instructions for Blood Glucose Monitoring and Insulin/Medications Dosing Guide"  in media tab for additional information. Please  also refer to " Patient Self Inventory" in the Media  tab for reviewed elements of pertinent patient history.  Melvin Taylor participated in the discussions, expressed understanding, and voiced agreement with the above plans.  All questions were answered to his satisfaction. he is encouraged to contact clinic should he have any questions or concerns prior to his return visit.   Follow up plan: -Return  in about 2 weeks (around 09/23/2020) for F/U with Meter and Logs Only - no Labs.  Glade Lloyd, MD Phone: [phone]  Fax: [phone]   09/09/2020, 9:25 AM

## 2020-09-09 NOTE — Patient Instructions (Signed)
                                     Advice for Weight Management  -For most of us the best way to lose weight is by diet management. Generally speaking, diet management means consuming less calories intentionally which over time brings about progressive weight loss.  This can be achieved more effectively by restricting carbohydrate consumption to the minimum possible.  So, it is critically important to know your numbers: how much calorie you are consuming and how much calorie you need. More importantly, our carbohydrates sources should be unprocessed or minimally processed complex starch food items.   Sometimes, it is important to balance nutrition by increasing protein intake (animal or plant source), fruits, and vegetables.  -Sticking to a routine mealtime to eat 3 meals a day and avoiding unnecessary snacks is shown to have a big role in weight control. Under normal circumstances, the only time we lose real weight is when we are hungry, so allow hunger to take place- hunger means no food between meal times, only water.  It is not advisable to starve.   -It is better to avoid simple carbohydrates including: Cakes, Sweet Desserts, Ice Cream, Soda (diet and regular), Sweet Tea, Candies, Chips, Cookies, Store Bought Juices, Alcohol in Excess of  1-2 drinks a day, Lemonade,  Artificial Sweeteners, Doughnuts, Coffee Creamers, "Sugar-free" Products, etc, etc.  This is not a complete list.....    -Consulting with certified diabetes educators is proven to provide you with the most accurate and current information on diet.  Also, you may be  interested in discussing diet options/exchanges , we can schedule a visit with Melvin Taylor, RDN, CDE for individualized nutrition education.  -Exercise: If you are able: 30 -60 minutes a day ,4 days a week, or 150 minutes a week.  The longer the better.  Combine stretch, strength, and aerobic activities.  If you were told in the  past that you have high risk for cardiovascular diseases, you may seek evaluation by your heart doctor prior to initiating moderate to intense exercise programs.                                  Additional Care Considerations for Diabetes   -Diabetes  is a chronic disease.  The most important care consideration is regular follow-up with your diabetes care provider with the goal being avoiding or delaying its complications and to take advantage of advances in medications and technology.    -Type 2 diabetes is known to coexist with other important comorbidities such as high blood pressure and high cholesterol.  It is critical to control not only the diabetes but also the high blood pressure and high cholesterol to minimize and delay the risk of complications including coronary artery disease, stroke, amputations, blindness, etc.    - Studies showed that people with diabetes will benefit from a class of medications known as ACE inhibitors and statins.  Unless there are specific reasons not to be on these medications, the standard of care is to consider getting one from these groups of medications at an optimal doses.  These medications are generally considered safe and proven to help protect the heart and the kidneys.    - People with diabetes are encouraged to initiate and maintain regular follow-up with eye doctors, foot   doctors, dentists , and if necessary heart and kidney doctors.     - It is highly recommended that people with diabetes quit smoking or stay away from smoking, and get yearly  flu vaccine and pneumonia vaccine at least every 5 years.  One other important lifestyle recommendation is to ensure adequate sleep - at least 6-7 hours of uninterrupted sleep at night.  -Exercise: If you are able: 30 -60 minutes a day, 4 days a week, or 150 minutes a week.  The longer the better.  Combine stretch, strength, and aerobic activities.  If you were told in the past that you have high risk for  cardiovascular diseases, you may seek evaluation by your heart doctor prior to initiating moderate to intense exercise programs.          

## 2020-09-10 ENCOUNTER — Encounter (HOSPITAL_COMMUNITY): Payer: Medicaid Other

## 2020-09-14 ENCOUNTER — Encounter (HOSPITAL_COMMUNITY): Payer: Self-pay

## 2020-09-14 ENCOUNTER — Emergency Department (HOSPITAL_COMMUNITY)
Admission: EM | Admit: 2020-09-14 | Discharge: 2020-09-14 | Disposition: A | Discharge status: Home / Self Care | Payer: Medicaid Other | Attending: Emergency Medicine | Admitting: Emergency Medicine

## 2020-09-14 ENCOUNTER — Other Ambulatory Visit: Payer: Self-pay

## 2020-09-14 ENCOUNTER — Emergency Department (HOSPITAL_COMMUNITY): Payer: Medicaid Other

## 2020-09-14 DIAGNOSIS — Z951 Presence of aortocoronary bypass graft: Secondary | ICD-10-CM | POA: Diagnosis not present

## 2020-09-14 DIAGNOSIS — Z794 Long term (current) use of insulin: Secondary | ICD-10-CM | POA: Diagnosis not present

## 2020-09-14 DIAGNOSIS — F17210 Nicotine dependence, cigarettes, uncomplicated: Secondary | ICD-10-CM | POA: Insufficient documentation

## 2020-09-14 DIAGNOSIS — Z7901 Long term (current) use of anticoagulants: Secondary | ICD-10-CM | POA: Diagnosis not present

## 2020-09-14 DIAGNOSIS — Z79899 Other long term (current) drug therapy: Secondary | ICD-10-CM | POA: Insufficient documentation

## 2020-09-14 DIAGNOSIS — I129 Hypertensive chronic kidney disease with stage 1 through stage 4 chronic kidney disease, or unspecified chronic kidney disease: Secondary | ICD-10-CM | POA: Diagnosis not present

## 2020-09-14 DIAGNOSIS — I2510 Atherosclerotic heart disease of native coronary artery without angina pectoris: Secondary | ICD-10-CM | POA: Insufficient documentation

## 2020-09-14 DIAGNOSIS — N183 Chronic kidney disease, stage 3 unspecified: Secondary | ICD-10-CM | POA: Diagnosis not present

## 2020-09-14 DIAGNOSIS — R42 Dizziness and giddiness: Secondary | ICD-10-CM | POA: Diagnosis not present

## 2020-09-14 DIAGNOSIS — R072 Precordial pain: Secondary | ICD-10-CM | POA: Diagnosis present

## 2020-09-14 DIAGNOSIS — R079 Chest pain, unspecified: Secondary | ICD-10-CM

## 2020-09-14 DIAGNOSIS — R0602 Shortness of breath: Secondary | ICD-10-CM | POA: Diagnosis not present

## 2020-09-14 DIAGNOSIS — E1122 Type 2 diabetes mellitus with diabetic chronic kidney disease: Secondary | ICD-10-CM | POA: Insufficient documentation

## 2020-09-14 LAB — BASIC METABOLIC PANEL
Anion gap: 5 (ref 5–15)
BUN: 18 mg/dL (ref 6–20)
CO2: 27 mmol/L (ref 22–32)
Calcium: 8.3 mg/dL — ABNORMAL LOW (ref 8.9–10.3)
Chloride: 103 mmol/L (ref 98–111)
Creatinine, Ser: 1.24 mg/dL (ref 0.61–1.24)
GFR, Estimated: >60 mL/min (ref >60)
Glucose, Bld: 211 mg/dL — ABNORMAL HIGH (ref 70–99)
Potassium: 4.3 mmol/L (ref 3.5–5.1)
Sodium: 135 mmol/L (ref 135–145)

## 2020-09-14 LAB — CBC
HCT: 35 % — ABNORMAL LOW (ref 39.0–52.0)
Hemoglobin: 12.1 g/dL — ABNORMAL LOW (ref 13.0–17.0)
MCH: 31.4 pg (ref 26.0–34.0)
MCHC: 34.6 g/dL (ref 30.0–36.0)
MCV: 90.9 fL (ref 80.0–100.0)
Platelets: 172 10*3/uL (ref 150–400)
RBC: 3.85 MIL/uL — ABNORMAL LOW (ref 4.22–5.81)
RDW: 12.2 % (ref 11.5–15.5)
WBC: 4.7 10*3/uL (ref 4.0–10.5)
nRBC: 0 % (ref 0.0–0.2)

## 2020-09-14 LAB — TROPONIN I (HIGH SENSITIVITY)
Troponin I (High Sensitivity): 3 ng/L (ref <18)
Troponin I (High Sensitivity): 3 ng/L (ref <18)

## 2020-09-14 MED ORDER — NITROGLYCERIN 0.4 MG SL SUBL
0.4000 mg | SUBLINGUAL_TABLET | SUBLINGUAL | Status: DC | PRN
  Administered 2020-09-14: 0.4 mg via SUBLINGUAL
  Filled 2020-09-14 (×2): qty 1

## 2020-09-14 MED ORDER — ISOSORBIDE MONONITRATE ER 30 MG PO TB24: 30.0000 mg | ORAL_TABLET | Freq: Every day | ORAL | 1 refills | Status: DC

## 2020-09-14 NOTE — ED Notes (Signed)
Pt now states he is having some chest pain. Will medicate per MD orders.

## 2020-09-14 NOTE — ED Triage Notes (Signed)
Pt reports that he was brought in by EMS due to CP that started last nigh. Pt took nitro last night and pain returned. Pt reports the pain kept waking him up during the night. Took nitro this am with relief then pain returned

## 2020-09-14 NOTE — ED Provider Notes (Signed)
[facility] Provider Note   CSN: [id number] Arrival date & time: 09/14/20  3536     History Chief Complaint  Patient presents with  . Chest Pain    Melvin Taylor is a 55 y.o. male.  He has a history of coronary disease and had a CABG 4 years ago.  He is complaining of chest pain that started last month intermittent worse since last night.  He describes it as painful heartbeats substernal 8 out of 10 intensity.  Tried nitro with brief relief.  Trouble sleeping all night long.  Associated with some lightheadedness and shortness of breath.  No diaphoresis nausea vomiting.  Similar to his heart attacks.  Recent surgery on his left foot due to bone spurs.  Smoker, denies cocaine.  Cannot take aspirin due to allergy.  The history is provided by the patient.  Chest Pain Pain location:  Substernal area Pain quality: aching   Pain radiates to:  Does not radiate Pain severity:  Severe Onset quality:  Sudden Duration:  12 hours Timing:  Intermittent Progression:  Unchanged Chronicity:  Recurrent Context: at rest   Relieved by:  Nothing Worsened by:  Nothing Ineffective treatments:  Nitroglycerin Associated symptoms: shortness of breath   Associated symptoms: no abdominal pain, no altered mental status, no back pain, no cough, no diaphoresis, no fever, no headache, no lower extremity edema, no nausea, no vomiting and no weakness   Risk factors: coronary artery disease, diabetes mellitus, high cholesterol, hypertension, male sex, smoking and surgery     HPI: A 55 year old patient with a history of treated diabetes, hypertension and hypercholesterolemia presents for evaluation of chest pain. Initial onset of pain was more than 6 hours ago. The patient's chest pain is described as heaviness/pressure/tightness and is not worse with exertion. The patient's chest pain is middle- or left-sided, is not well-localized, is not sharp and does not radiate to the arms/jaw/neck.  The patient does not complain of nausea and denies diaphoresis. The patient has smoked in the past 90 days. The patient has no history of stroke, has no history of peripheral artery disease, has no relevant family history of coronary artery disease (first degree relative at less than age 21) and does not have an elevated BMI (>=30).   Past Medical History:  Diagnosis Date  . Anxiety   . Bipolar 1 disorder ([place])   . Bone spur    left heel  . Cervical radiculopathy   . Chest pain   . Chronic back pain   . Chronic chest wall pain   . Chronic neck pain   . Coronary artery disease    a. s/p CABG in 03/2016 with LIMA-LAD, SVG-D1, SVG-RCA, and Seq SVG-mid and distal OM  . Depression   . Diabetes mellitus    Type II  . Hallucinations    "long history of them"  . Headache(784.0)   . History of gout   . HTN (hypertension)   . Insomnia   . Myocardial infarction ([place]) 2017  . Neuropathy   . Pain management   . Polysubstance abuse ([place])   . Right leg pain    chronic  . Schizophrenia ([place])   . Seizures ([place])    last sz between July 5-9th, 2016; epilepsy  . Shortness of breath dyspnea    with exertion  . Sleep apnea   . Transfusion of blood product refused for religious reason     Patient Active Problem List   Diagnosis Date Noted  .  Current smoker 06/21/2019  . Seizure-like activity ([place]) 05/06/2019  . Type 2 diabetes mellitus with stage 3 chronic kidney disease, with long-term current use of insulin ([place]) 04/11/2019  . Anemia, chronic disease 07/04/2018  . Other specified diseases of the digestive system 10/30/2017  . CAD (coronary artery disease) 03/28/2016  . Chest pain 03/21/2016  . Mixed hyperlipidemia 03/11/2016  . Ischemic chest pain ([place])   . NSTEMI (non-ST elevated myocardial infarction) ([place])   . Coronary atherosclerosis of native coronary artery 03/03/2016  . Cervical spinal stenosis 12/14/2015  . Loss of weight 08/25/2015  . Essential hypertension, benign  07/23/2015  . Personal history of noncompliance with medical treatment, presenting hazards to health 07/23/2015  . Abdominal pain 06/08/2015  . Constipation 06/08/2015  . History of colonic polyps   . Diverticulosis of colon without hemorrhage   . Mucosal abnormality of stomach   . Encounter for screening colonoscopy 04/16/2015  . Dysphagia 04/16/2015  . Partial symptomatic epilepsy with complex partial seizures, intractable, without status epilepticus ([place]) 11/10/2014  . Bipolar disorder, unspecified ([place]) 10/15/2014  . Schizophrenia ([place]) 10/15/2014  . DM type 2 causing vascular disease ([place]) 06/29/2014  . Musculoskeletal pain 06/29/2014  . Hyperglycemia without ketosis 09/07/2013  . 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 Other internal derangements of unspecified knee 01/13/2011  . Knee pain 12/28/2010  . Medial meniscus, posterior horn derangement 12/28/2010    Past Surgical History:  Procedure Laterality Date  . ANTERIOR CERVICAL DECOMP/DISCECTOMY FUSION N/A 12/14/2015   Procedure: ANTERIOR CERVICAL DECOMPRESSION/DISCECTOMY FUSION CERVICAL FIVE -SIX;  Surgeon: Earnie Larsson, MD;  Location: [facility];  Service: Neurosurgery;  Laterality: N/A;  . BIOPSY  05/07/2015   Procedure: BIOPSY (Gastric);  Surgeon: [name], MD;  Location: [facility];  Service: Endoscopy;;  . CARDIAC CATHETERIZATION N/A 03/07/2016   Procedure: Left Heart Cath and Coronary Angiography;  Surgeon: Belva Crome, MD;  Location: [facility];  Service: Cardiovascular;  Laterality: N/A;  . COLONOSCOPY WITH PROPOFOL N/A 05/07/2015   DUK:GURKYHCWCB diverticulosis, multiple colon polyps removed, tubular adenoma, serrated colon polyp. Next colonoscopy October 2019  . COLONOSCOPY WITH PROPOFOL N/A 08/02/2018   Procedure: COLONOSCOPY WITH PROPOFOL;  Surgeon: [name], MD;  Location: [facility];  Service: Endoscopy;  Laterality: N/A;  12:00pm  . CORONARY ARTERY BYPASS GRAFT N/A 03/28/2016    Procedure: CORONARY ARTERY BYPASS GRAFTING (CABG) x 5 USING GREATER SAPHENOUS VEIN;  Surgeon: [name], MD;  Location: [facility];  Service: Open Heart Surgery;  Laterality: N/A;  . ENDOVEIN HARVEST OF GREATER SAPHENOUS VEIN Right 03/28/2016   Procedure: ENDOVEIN HARVEST OF GREATER SAPHENOUS VEIN;  Surgeon: [name], MD;  Location: [facility];  Service: Open Heart Surgery;  Laterality: Right;  . ESOPHAGEAL DILATION N/A 05/07/2015   Procedure: ESOPHAGEAL DILATION WITH 56FR MALONEY DILATOR;  Surgeon: [name], MD;  Location: [facility];  Service: Endoscopy;  Laterality: N/A;  . ESOPHAGOGASTRODUODENOSCOPY (EGD) WITH PROPOFOL N/A 05/07/2015   RMR: Status post dilation of normal esophagus. Gastritis.  Marland Kitchen KNEE SURGERY Left    arthroscopy  . MANDIBLE FRACTURE SURGERY    . POLYPECTOMY  05/07/2015   Procedure: POLYPECTOMY (Hepatic Flexure, Distal Transverse Colon, Rectal);  Surgeon: [name], MD;  Location: [facility];  Service: Endoscopy;;  . TEE WITHOUT CARDIOVERSION N/A 03/28/2016   Procedure: TRANSESOPHAGEAL ECHOCARDIOGRAM (TEE);  Surgeon: [name], MD;  Location: [facility];  Service: Open Heart Surgery;  Laterality: N/A;  .  TONSILLECTOMY         Family History  Problem Relation Age of Onset  . Diabetes Father   . Hypertension Father   . Arthritis Other   . Diabetes Other   . Asthma Other   . Stroke Sister     Social History   Tobacco Use  . Smoking status: Current Every Day Smoker    Packs/day: 0.50    Years: 30.00    Pack years: 15.00    Types: Cigarettes  . Smokeless tobacco: Never Used  . Tobacco comment: 3 cigs per day  Vaping Use  . Vaping Use: Never used  Substance Use Topics  . Alcohol use: No  . Drug use: Yes    Types: Marijuana    Home Medications Prior to Admission medications   Medication Sig Start Date End Date Taking? Authorizing Provider  albuterol (PROVENTIL HFA;VENTOLIN HFA) 108 (90 Base) MCG/ACT inhaler Inhale 2 puffs into the lungs every 4 (four)  hours as needed for wheezing or shortness of breath.     [provider]  amLODipine (NORVASC) 10 MG tablet Take 1 tablet (10 mg total) by mouth daily. 04/14/20   Verta Ellen., NP  baclofen (LIORESAL) 10 MG tablet Take 10 mg by mouth 2 (two) times daily. 02/18/20   [provider]  clopidogrel (PLAVIX) 75 MG tablet Take 75 mg by mouth daily.    [provider]  diazepam (VALIUM) 5 MG tablet Take 5 mg by mouth 2 (two) times daily as needed for anxiety.     [provider]  divalproex (DEPAKOTE) 500 MG DR tablet Take 3 tablets (1,500 mg total) by mouth 2 (two) times daily. 06/02/20   Penumalli, Earlean Polka, MD  escitalopram (LEXAPRO) 20 MG tablet Take 20 mg by mouth daily.     [provider]  glucose blood (ACCU-CHEK GUIDE) test strip Use to check glucose 4 times a day 09/09/20   Cassandria Anger, MD  insulin aspart (NOVOLOG) 100 UNIT/ML FlexPen Inject 4-10 Units into the skin 3 (three) times daily with meals. 09/09/20   Cassandria Anger, MD  insulin glargine (LANTUS SOLOSTAR) 100 UNIT/ML Solostar Pen Inject 36 Units into the skin at bedtime. 09/09/20   Cassandria Anger, MD  lamoTRIgine (LAMICTAL) 25 MG tablet Take 50 mg by mouth 2 (two) times daily.  05/20/19   [provider]  lisinopril (ZESTRIL) 20 MG tablet TAKE ONE TABLET BY MOUTH ONCE DAILY. 07/17/20   Strader, Fransisco Hertz, PA-C  Melatonin 10 MG CAPS Take 1 capsule by mouth at bedtime as needed (for sleep).  06/12/19   [provider]  nitroGLYCERIN (NITROSTAT) 0.4 MG SL tablet Place 1 tablet (0.4 mg total) under the tongue every 5 (five) minutes as needed for chest pain. 01/15/20 04/14/20  Herminio Commons, MD  oxyCODONE-acetaminophen (PERCOCET) 10-325 MG tablet Take 1 tablet by mouth every 4 (four) hours as needed for pain. 08/31/20   [name], DPM  oxyCODONE-acetaminophen (PERCOCET) 10-325 MG tablet Take 1 tablet by mouth every 4 (four) hours as needed for pain.  09/07/20   [name], DPM  oxyCODONE-acetaminophen (PERCOCET) 5-325 MG tablet Take 1-2 tablets by mouth every 4 (four) hours as needed for severe pain. 06/09/20   [name], DPM  oxyCODONE-acetaminophen (PERCOCET) 5-325 MG tablet Take 1-2 tablets by mouth every 4 (four) hours as needed for severe pain. 07/08/20   [name], DPM  oxyCODONE-acetaminophen (PERCOCET) 5-325 MG tablet Take 1-2  tablets by mouth every 4 (four) hours as needed for severe pain. 07/27/20   [name], DPM  oxyCODONE-acetaminophen (PERCOCET) 5-325 MG tablet Take 1-2 tablets by mouth every 4 (four) hours as needed for severe pain. 08/19/20   [name], DPM  paliperidone (INVEGA) 6 MG 24 hr tablet Take 12 mg by mouth daily.  01/19/21  [provider]  prazosin (MINIPRESS) 2 MG capsule Take 2-4 mg by mouth at bedtime as needed (for sleep).  12/03/19   [provider]  ranolazine (RANEXA) 1000 MG SR tablet Take 1 tablet (1,000 mg total) by mouth 2 (two) times daily. 09/03/20   Ahmed Prima, Fransisco Hertz, PA-C  risperiDONE (RISPERDAL) 3 MG tablet Take 3 mg by mouth at bedtime. 08/16/19   [provider]    Allergies    Aspirin  Review of Systems   Review of Systems  Constitutional: Negative for diaphoresis and fever.  HENT: Negative for sore throat.   Eyes: Negative for visual disturbance.  Respiratory: Positive for shortness of breath. Negative for cough.   Cardiovascular: Positive for chest pain.  Gastrointestinal: Negative for abdominal pain, nausea and vomiting.  Genitourinary: Negative for dysuria.  Musculoskeletal: Negative for back pain.  Skin: Negative for rash.  Neurological: Negative for weakness and headaches.    Physical Exam Updated Vital Signs BP (!) 183/94 (BP Location: Left Arm)   Pulse (!) 58   Temp 98.2 F (36.8 C) (Oral)   Resp 14   Ht 5\' 7"  (1.702 m)   Wt 68 kg   SpO2 100%   BMI 23.48 kg/m   Physical Exam Vitals and nursing note reviewed.  Constitutional:       Appearance: He is well-developed and well-nourished.  HENT:     Head: Normocephalic and atraumatic.  Eyes:     Conjunctiva/sclera: Conjunctivae normal.  Cardiovascular:     Rate and Rhythm: Normal rate and regular rhythm.     Heart sounds: No murmur heard.   Pulmonary:     Effort: Pulmonary effort is normal. No respiratory distress.     Breath sounds: Normal breath sounds.  Abdominal:     Palpations: Abdomen is soft.     Tenderness: There is no abdominal tenderness.  Musculoskeletal:        General: No edema. Normal range of motion.     Cervical back: Neck supple.     Right lower leg: No tenderness. No edema.     Left lower leg: No tenderness. No edema.     Comments: Left foot in walking boot  Skin:    General: Skin is warm and dry.     Capillary Refill: Capillary refill takes less than 2 seconds.  Neurological:     General: No focal deficit present.     Mental Status: He is alert.  Psychiatric:        Mood and Affect: Mood and affect normal.     ED Results / Procedures / Treatments   Labs (all labs ordered are listed, but only abnormal results are displayed) Labs Reviewed  BASIC METABOLIC PANEL - Abnormal; Notable for the following components:      Result Value   Glucose, Bld 211 (*)    Calcium 8.3 (*)    All other components within normal limits  CBC - Abnormal; Notable for the following components:   RBC 3.85 (*)    Hemoglobin 12.1 (*)    HCT 35.0 (*)    All other components within normal limits  TROPONIN I (HIGH SENSITIVITY)  TROPONIN I (HIGH SENSITIVITY)    EKG EKG Interpretation  Date/Time:  Monday September 14 2020 09:01:00 EST Ventricular Rate:  58 PR Interval:    QRS Duration: 91 QT Interval:  420 QTC Calculation: 413 R Axis:   -46 Text Interpretation: Sinus rhythm Left anterior fascicular block Anterior infarct, old No significant change since prior 6/21 Confirmed by Aletta Edouard [phone]) on 09/14/2020 9:22:28 AM   Radiology DG Chest 2  View  Result Date: 09/14/2020 CLINICAL DATA:  Chest pain EXAM: CHEST - 2 VIEW COMPARISON:  01/14/2020 FINDINGS: Normal heart size and mediastinal contours. Prior CABG. No acute infiltrate or edema. No effusion or pneumothorax. No acute osseous findings. Artifact from EKG leads. IMPRESSION: No active cardiopulmonary disease. Electronically Signed   By: Monte Fantasia M.D.   On: 09/14/2020 09:48    Procedures Procedures   Medications Ordered in ED Medications  nitroGLYCERIN (NITROSTAT) SL tablet 0.4 mg (0.4 mg Sublingual Given 09/14/20 1010)    ED Course  I have reviewed the triage vital signs and the nursing notes.  Pertinent labs & imaging results that were available during my care of the patient were reviewed by me and considered in my medical decision making (see chart for details).  Clinical Course as of 09/14/20 1743  Mon Sep 14, 2020  0922 Discussed with Dr. Johnsie Cancel cardiology on-call.  He did not feel he needed admission to the hospital but should keep his appointment for his outpatient stress test.  Asked to start him on Imdur 30 if no contraindication. [MB]  [id number] Chest x-ray interpreted by me as no acute infiltrates. [MB]  8127 Reassessment-patient states he is pain-free currently.  Work-up showing flat troponins.  Chest x-ray without any acute infiltrates.  He said he has a stress test coming up in about a week.  Will review with cardiology. [MB]  1240 Reviewed results and recommendations from cardiology with patient and he is comfortable plan.  He is not taking Imdur before.  Agreeable to plan. [MB]    Clinical Course User Index [MB] Hayden Rasmussen, MD   MDM Rules/Calculators/A&P HEAR Score: 5                       This patient complains of painful heart beating; this involves an extensive number of treatment Options and is a complaint that carries with it a high risk of complications and Morbidity. The differential includes ACS, arrhythmia, PVCs, metabolic derangement,  PE, pneumonia, GERD, musculoskeletal  I ordered, reviewed and interpreted labs, which included CBC with normal white count, hemoglobin slightly lower than baseline, chemistries normal other than elevated glucose, troponins flat I ordered medication nitroglycerin with improvement in symptoms I ordered imaging studies which included chest x-ray and I independently    visualized and interpreted imaging which showed no acute pulmonary disease Previous records obtained and reviewed in epic including recent cardiology clinic visit I consulted Dr. Johnsie Cancel cardiology and discussed lab and imaging findings  Critical Interventions: None  After the interventions stated above, I reevaluated the patient and found patient to be pain-free resting comfortably.  I reviewed his work-up with him and he is comfortable plan for outpatient follow-up with cardiology.  He is comfortable plan for starting Imdur.  Return instructions discussed   Final Clinical Impression(s) / ED Diagnoses Final diagnoses:  Nonspecific chest pain    Rx / DC Orders ED Discharge Orders         Ordered  isosorbide mononitrate (IMDUR) 30 MG 24 hr tablet  Daily        09/14/20 1241           Hayden Rasmussen, MD 09/14/20 1746

## 2020-09-14 NOTE — ED Notes (Signed)
Pt sitting up in bed at this time in no distress. Pt denies chest pain, denies feeling sob. Will continue to monitor patient. Bed lowered and locked with call bell in reach. Side rails up x2.

## 2020-09-14 NOTE — Discharge Instructions (Addendum)
You were seen in the emergency department for chest pain.  You had blood work EKG and a chest x-ray that did not show any signs of heart attack.  Cardiology is recommending that we start you on Imdur.  Please keep your appointment for your stress test.  Return to the emergency department if any worsening or concerning symptoms

## 2020-09-16 ENCOUNTER — Ambulatory Visit (INDEPENDENT_AMBULATORY_CARE_PROVIDER_SITE_OTHER): Payer: Medicaid Other | Admitting: Podiatry

## 2020-09-16 ENCOUNTER — Other Ambulatory Visit: Payer: Self-pay

## 2020-09-16 ENCOUNTER — Other Ambulatory Visit (HOSPITAL_COMMUNITY): Payer: Medicaid Other

## 2020-09-16 ENCOUNTER — Encounter (HOSPITAL_COMMUNITY): Payer: Medicaid Other

## 2020-09-16 ENCOUNTER — Encounter: Payer: Self-pay | Admitting: Podiatry

## 2020-09-16 DIAGNOSIS — Z9889 Other specified postprocedural states: Secondary | ICD-10-CM

## 2020-09-16 DIAGNOSIS — M722 Plantar fascial fibromatosis: Secondary | ICD-10-CM

## 2020-09-16 NOTE — Progress Notes (Signed)
Subjective:  Patient ID: Melvin Taylor, male    DOB: Jun 07, 1966,  MRN: [id number]  Chief Complaint  Patient presents with  . Routine Post Op    Routine post op DOS 1.24.22      55 y.o. male returns for post-op check.  He is doing well.  He denies any other acute complaints.  He still has tenderness and pain to it.  He has been ambulating with a cam boot.  He has been taking his pain medication.  Review of Systems: Negative except as noted in the HPI. Denies N/V/F/Ch.  Past Medical History:  Diagnosis Date  . Anxiety   . Bipolar 1 disorder ([place])   . Bone spur    left heel  . Cervical radiculopathy   . Chest pain   . Chronic back pain   . Chronic chest wall pain   . Chronic neck pain   . Coronary artery disease    a. s/p CABG in 03/2016 with LIMA-LAD, SVG-D1, SVG-RCA, and Seq SVG-mid and distal OM  . Depression   . Diabetes mellitus    Type II  . Hallucinations    "long history of them"  . Headache(784.0)   . History of gout   . HTN (hypertension)   . Insomnia   . Myocardial infarction ([place]) 2017  . Neuropathy   . Pain management   . Polysubstance abuse ([place])   . Right leg pain    chronic  . Schizophrenia ([place])   . Seizures ([place])    last sz between July 5-9th, 2016; epilepsy  . Shortness of breath dyspnea    with exertion  . Sleep apnea   . Transfusion of blood product refused for religious reason     Current Outpatient Medications:  .  albuterol (PROVENTIL HFA;VENTOLIN HFA) 108 (90 Base) MCG/ACT inhaler, Inhale 2 puffs into the lungs every 4 (four) hours as needed for wheezing or shortness of breath. , Disp: , Rfl:  .  amLODipine (NORVASC) 10 MG tablet, Take 1 tablet (10 mg total) by mouth daily., Disp: 30 tablet, Rfl: 3 .  baclofen (LIORESAL) 10 MG tablet, Take 10 mg by mouth 2 (two) times daily., Disp: , Rfl:  .  clopidogrel (PLAVIX) 75 MG tablet, Take 75 mg by mouth daily., Disp: , Rfl:  .  diazepam (VALIUM) 5 MG tablet, Take 5 mg by mouth 2 (two) times  daily as needed for anxiety. , Disp: , Rfl:  .  divalproex (DEPAKOTE) 500 MG DR tablet, Take 3 tablets (1,500 mg total) by mouth 2 (two) times daily., Disp: 540 tablet, Rfl: 4 .  escitalopram (LEXAPRO) 20 MG tablet, Take 20 mg by mouth daily. , Disp: , Rfl:  .  glucose blood (ACCU-CHEK GUIDE) test strip, Use to check glucose 4 times a day, Disp: 150 strip, Rfl: 2 .  insulin aspart (NOVOLOG) 100 UNIT/ML FlexPen, Inject 4-10 Units into the skin 3 (three) times daily with meals., Disp: 15 mL, Rfl: 1 .  insulin glargine (LANTUS SOLOSTAR) 100 UNIT/ML Solostar Pen, Inject 36 Units into the skin at bedtime., Disp: 15 mL, Rfl: 1 .  isosorbide mononitrate (IMDUR) 30 MG 24 hr tablet, Take 1 tablet (30 mg total) by mouth daily., Disp: 30 tablet, Rfl: 1 .  lamoTRIgine (LAMICTAL) 25 MG tablet, Take 50 mg by mouth 2 (two) times daily. , Disp: , Rfl:  .  lisinopril (ZESTRIL) 20 MG tablet, TAKE ONE TABLET BY MOUTH ONCE DAILY., Disp: 90 tablet, Rfl: 0 .  Melatonin  10 MG CAPS, Take 1 capsule by mouth at bedtime as needed (for sleep). , Disp: , Rfl:  .  nitroGLYCERIN (NITROSTAT) 0.4 MG SL tablet, Place 1 tablet (0.4 mg total) under the tongue every 5 (five) minutes as needed for chest pain., Disp: 25 tablet, Rfl: 3 .  oxyCODONE-acetaminophen (PERCOCET) 10-325 MG tablet, Take 1 tablet by mouth every 4 (four) hours as needed for pain., Disp: 30 tablet, Rfl: 0 .  paliperidone (INVEGA) 6 MG 24 hr tablet, Take 12 mg by mouth daily., Disp: , Rfl:  .  prazosin (MINIPRESS) 2 MG capsule, Take 2-4 mg by mouth at bedtime as needed (for sleep). , Disp: , Rfl:  .  ranolazine (RANEXA) 1000 MG SR tablet, Take 1 tablet (1,000 mg total) by mouth 2 (two) times daily., Disp: 180 tablet, Rfl: 3 .  risperiDONE (RISPERDAL) 3 MG tablet, Take 3 mg by mouth at bedtime., Disp: , Rfl:   Social History   Tobacco Use  Smoking Status Current Every Day Smoker  . Packs/day: 0.50  . Years: 30.00  . Pack years: 15.00  . Types: Cigarettes   Smokeless Tobacco Never Used  Tobacco Comment   3 cigs per day    Allergies  Allergen Reactions  . Aspirin Swelling and Other (See Comments)    Facial swelling    Objective:  There were no vitals filed for this visit. There is no height or weight on file to calculate BMI. Constitutional Well developed. Well nourished.  Vascular Foot warm and well perfused. Capillary refill normal to all digits.   Neurologic Normal speech. Oriented to person, place, and time. Epicritic sensation to light touch grossly present bilaterally.  Dermatologic Skin healing well without signs of infection. Skin edges well coapted without signs of infection.  Orthopedic: Tenderness to palpation noted about the surgical site.   Radiographs: None Assessment:   1. Plantar fasciitis of left foot   2. Status post foot surgery    Plan:  Patient was evaluated and treated and all questions answered.  S/p foot surgery left -Progressing as expected post-operatively. -XR: None -WB Status: He can begin transitioning into regular shoes slowly. -Sutures: Removed no signs of dehiscence noted.  No clinical signs of infection noted. -Medications: None -Foot redressed.  No follow-ups on file.

## 2020-09-18 ENCOUNTER — Telehealth: Payer: Self-pay | Admitting: Student

## 2020-09-18 NOTE — Telephone Encounter (Signed)
New message    Patient wants to know if you will call in some muscle relaxers (please call patient and let him know if she agrees he can only take certain ones)  for him to [place] ? He pulled a muscle yesterday and is having a lot of pian.

## 2020-09-18 NOTE — Telephone Encounter (Signed)
Patient returned phone call. Explained to patient that Cardiology does not prescribe muscle relaxer's and he should contact his PCP in that regard. Patient verbalized understanding.

## 2020-09-18 NOTE — Telephone Encounter (Signed)
Tried returning patient call. No answer. No voicemail.

## 2020-09-21 ENCOUNTER — Encounter (HOSPITAL_COMMUNITY)
Admission: RE | Admit: 2020-09-21 | Discharge: 2020-09-21 | Disposition: A | Discharge status: Home / Self Care | Payer: Medicaid Other | Source: Ambulatory Visit | Attending: Nurse Practitioner | Admitting: Nurse Practitioner

## 2020-09-21 ENCOUNTER — Other Ambulatory Visit: Payer: Self-pay | Admitting: Podiatry

## 2020-09-21 ENCOUNTER — Telehealth: Payer: Self-pay | Admitting: Podiatry

## 2020-09-21 ENCOUNTER — Encounter (HOSPITAL_COMMUNITY): Payer: Self-pay

## 2020-09-21 ENCOUNTER — Ambulatory Visit (HOSPITAL_COMMUNITY)
Admission: RE | Admit: 2020-09-21 | Discharge: 2020-09-21 | Disposition: A | Discharge status: Home / Self Care | Payer: Medicaid Other | Source: Ambulatory Visit | Attending: Student | Admitting: Student

## 2020-09-21 DIAGNOSIS — R079 Chest pain, unspecified: Secondary | ICD-10-CM | POA: Insufficient documentation

## 2020-09-21 DIAGNOSIS — I25708 Atherosclerosis of coronary artery bypass graft(s), unspecified, with other forms of angina pectoris: Secondary | ICD-10-CM | POA: Diagnosis present

## 2020-09-21 LAB — NM MYOCAR MULTI W/SPECT W/WALL MOTION / EF
LV dias vol: 88 mL (ref 62–150)
LV sys vol: 38 mL
Peak HR: 86 {beats}/min
RATE: 0.29
Rest HR: 62 {beats}/min
SDS: 4
SRS: 0
SSS: 4
TID: 1.01

## 2020-09-21 MED ORDER — SODIUM CHLORIDE FLUSH 0.9 % IV SOLN
INTRAVENOUS | Status: AC
  Administered 2020-09-21: 10 mL via INTRAVENOUS
  Filled 2020-09-21: qty 10

## 2020-09-21 MED ORDER — REGADENOSON 0.4 MG/5ML IV SOLN
INTRAVENOUS | Status: AC
  Administered 2020-09-21: 0.4 mg via INTRAVENOUS
  Filled 2020-09-21: qty 5

## 2020-09-21 MED ORDER — TECHNETIUM TC 99M TETROFOSMIN IV KIT
30.0000 | PACK | Freq: Once | INTRAVENOUS | Status: AC | PRN
  Administered 2020-09-21: 28.1 via INTRAVENOUS

## 2020-09-21 MED ORDER — TECHNETIUM TC 99M TETROFOSMIN IV KIT
10.0000 | PACK | Freq: Once | INTRAVENOUS | Status: AC | PRN
  Administered 2020-09-21: 10.75 via INTRAVENOUS

## 2020-09-21 NOTE — Telephone Encounter (Signed)
Sent I only did 15 tablets of this dose. Will need to start to decrease pain meds

## 2020-09-21 NOTE — Telephone Encounter (Signed)
Please advise . Dr. Posey Pronto is out of the office. Thanks

## 2020-09-21 NOTE — Telephone Encounter (Signed)
Please advise 

## 2020-09-21 NOTE — Telephone Encounter (Signed)
Please advise. Dr. Patel is out of the office  °

## 2020-09-21 NOTE — Telephone Encounter (Signed)
I have already spoken with the patient. He was upset it shows up as "denied" but there were multiple requests that had come through. I had sent it over earlier today. He did get the refill that I sent over today from the pharmacy.

## 2020-09-21 NOTE — Telephone Encounter (Signed)
Patient has requested refill on pain meds, Please Advise 

## 2020-09-22 NOTE — Telephone Encounter (Signed)
Ok thanks 

## 2020-09-23 ENCOUNTER — Ambulatory Visit (INDEPENDENT_AMBULATORY_CARE_PROVIDER_SITE_OTHER): Payer: Medicaid Other | Admitting: "Endocrinology

## 2020-09-23 ENCOUNTER — Encounter: Payer: Self-pay | Admitting: "Endocrinology

## 2020-09-23 ENCOUNTER — Other Ambulatory Visit: Payer: Self-pay

## 2020-09-23 VITALS — BP 122/78 | HR 60 | Ht 67.0 in | Wt 147.2 lb

## 2020-09-23 DIAGNOSIS — E1159 Type 2 diabetes mellitus with other circulatory complications: Secondary | ICD-10-CM

## 2020-09-23 DIAGNOSIS — E782 Mixed hyperlipidemia: Secondary | ICD-10-CM | POA: Diagnosis not present

## 2020-09-23 DIAGNOSIS — F17200 Nicotine dependence, unspecified, uncomplicated: Secondary | ICD-10-CM | POA: Diagnosis not present

## 2020-09-23 DIAGNOSIS — I10 Essential (primary) hypertension: Secondary | ICD-10-CM

## 2020-09-23 MED ORDER — LANTUS SOLOSTAR 100 UNIT/ML ~~LOC~~ SOPN: 40.0000 [IU] | PEN_INJECTOR | Freq: Every day | SUBCUTANEOUS | 1 refills | Status: DC

## 2020-09-23 NOTE — Patient Instructions (Signed)
                                     Advice for Weight Management  -For most of us the best way to lose weight is by diet management. Generally speaking, diet management means consuming less calories intentionally which over time brings about progressive weight loss.  This can be achieved more effectively by restricting carbohydrate consumption to the minimum possible.  So, it is critically important to know your numbers: how much calorie you are consuming and how much calorie you need. More importantly, our carbohydrates sources should be unprocessed or minimally processed complex starch food items.   Sometimes, it is important to balance nutrition by increasing protein intake (animal or plant source), fruits, and vegetables.  -Sticking to a routine mealtime to eat 3 meals a day and avoiding unnecessary snacks is shown to have a big role in weight control. Under normal circumstances, the only time we lose real weight is when we are hungry, so allow hunger to take place- hunger means no food between meal times, only water.  It is not advisable to starve.   -It is better to avoid simple carbohydrates including: Cakes, Sweet Desserts, Ice Cream, Soda (diet and regular), Sweet Tea, Candies, Chips, Cookies, Store Bought Juices, Alcohol in Excess of  1-2 drinks a day, Lemonade,  Artificial Sweeteners, Doughnuts, Coffee Creamers, "Sugar-free" Products, etc, etc.  This is not a complete list.....    -Consulting with certified diabetes educators is proven to provide you with the most accurate and current information on diet.  Also, you may be  interested in discussing diet options/exchanges , we can schedule a visit with Melvin Taylor, RDN, CDE for individualized nutrition education.  -Exercise: If you are able: 30 -60 minutes a day ,4 days a week, or 150 minutes a week.  The longer the better.  Combine stretch, strength, and aerobic activities.  If you were told in the  past that you have high risk for cardiovascular diseases, you may seek evaluation by your heart doctor prior to initiating moderate to intense exercise programs.                                  Additional Care Considerations for Diabetes   -Diabetes  is a chronic disease.  The most important care consideration is regular follow-up with your diabetes care provider with the goal being avoiding or delaying its complications and to take advantage of advances in medications and technology.    -Type 2 diabetes is known to coexist with other important comorbidities such as high blood pressure and high cholesterol.  It is critical to control not only the diabetes but also the high blood pressure and high cholesterol to minimize and delay the risk of complications including coronary artery disease, stroke, amputations, blindness, etc.    - Studies showed that people with diabetes will benefit from a class of medications known as ACE inhibitors and statins.  Unless there are specific reasons not to be on these medications, the standard of care is to consider getting one from these groups of medications at an optimal doses.  These medications are generally considered safe and proven to help protect the heart and the kidneys.    - People with diabetes are encouraged to initiate and maintain regular follow-up with eye doctors, foot   doctors, dentists , and if necessary heart and kidney doctors.     - It is highly recommended that people with diabetes quit smoking or stay away from smoking, and get yearly  flu vaccine and pneumonia vaccine at least every 5 years.  One other important lifestyle recommendation is to ensure adequate sleep - at least 6-7 hours of uninterrupted sleep at night.  -Exercise: If you are able: 30 -60 minutes a day, 4 days a week, or 150 minutes a week.  The longer the better.  Combine stretch, strength, and aerobic activities.  If you were told in the past that you have high risk for  cardiovascular diseases, you may seek evaluation by your heart doctor prior to initiating moderate to intense exercise programs.          

## 2020-09-23 NOTE — Progress Notes (Signed)
09/23/2020   Endocrinology follow-up note   Subjective:    Patient ID: Melvin Taylor, male    DOB: 01-11-1966, PCP [name], FNP   Past Medical History:  Diagnosis Date  . Anxiety   . Bipolar 1 disorder ([place])   . Bone spur    left heel  . Cervical radiculopathy   . Chest pain   . Chronic back pain   . Chronic chest wall pain   . Chronic neck pain   . Coronary artery disease    a. s/p CABG in 03/2016 with LIMA-LAD, SVG-D1, SVG-RCA, and Seq SVG-mid and distal OM  . Depression   . Diabetes mellitus    Type II  . Hallucinations    "long history of them"  . Headache(784.0)   . History of gout   . HTN (hypertension)   . Insomnia   . Myocardial infarction ([place]) 2017  . Neuropathy   . Pain management   . Polysubstance abuse ([place])   . Right leg pain    chronic  . Schizophrenia ([place])   . Seizures ([place])    last sz between July 5-9th, 2016; epilepsy  . Shortness of breath dyspnea    with exertion  . Sleep apnea   . Transfusion of blood product refused for religious reason    Past Surgical History:  Procedure Laterality Date  . ANTERIOR CERVICAL DECOMP/DISCECTOMY FUSION N/A 12/14/2015   Procedure: ANTERIOR CERVICAL DECOMPRESSION/DISCECTOMY FUSION CERVICAL FIVE -SIX;  Surgeon: Earnie Larsson, MD;  Location: [facility];  Service: Neurosurgery;  Laterality: N/A;  . BIOPSY  05/07/2015   Procedure: BIOPSY (Gastric);  Surgeon: [name], MD;  Location: [facility];  Service: Endoscopy;;  . CARDIAC CATHETERIZATION N/A 03/07/2016   Procedure: Left Heart Cath and Coronary Angiography;  Surgeon: Belva Crome, MD;  Location: [facility];  Service: Cardiovascular;  Laterality: N/A;  . COLONOSCOPY WITH PROPOFOL N/A 05/07/2015   HGD:[id number] diverticulosis, multiple colon polyps removed, tubular adenoma, serrated colon polyp. Next colonoscopy October 2019  . COLONOSCOPY WITH PROPOFOL N/A 08/02/2018   Procedure: COLONOSCOPY WITH PROPOFOL;  Surgeon: [name], MD;   Location: [facility];  Service: Endoscopy;  Laterality: N/A;  12:00pm  . CORONARY ARTERY BYPASS GRAFT N/A 03/28/2016   Procedure: CORONARY ARTERY BYPASS GRAFTING (CABG) x 5 USING GREATER SAPHENOUS VEIN;  Surgeon: [name], MD;  Location: [facility];  Service: Open Heart Surgery;  Laterality: N/A;  . ENDOVEIN HARVEST OF GREATER SAPHENOUS VEIN Right 03/28/2016   Procedure: ENDOVEIN HARVEST OF GREATER SAPHENOUS VEIN;  Surgeon: [name], MD;  Location: [facility];  Service: Open Heart Surgery;  Laterality: Right;  . ESOPHAGEAL DILATION N/A 05/07/2015   Procedure: ESOPHAGEAL DILATION WITH 56FR MALONEY DILATOR;  Surgeon: [name], MD;  Location: [facility];  Service: Endoscopy;  Laterality: N/A;  . ESOPHAGOGASTRODUODENOSCOPY (EGD) WITH PROPOFOL N/A 05/07/2015   RMR: Status post dilation of normal esophagus. Gastritis.  Marland Kitchen KNEE SURGERY Left    arthroscopy  . MANDIBLE FRACTURE SURGERY    . POLYPECTOMY  05/07/2015   Procedure: POLYPECTOMY (Hepatic Flexure, Distal Transverse Colon, Rectal);  Surgeon: [name], MD;  Location: [facility];  Service: Endoscopy;;  . TEE WITHOUT CARDIOVERSION N/A 03/28/2016   Procedure: TRANSESOPHAGEAL ECHOCARDIOGRAM (TEE);  Surgeon: [name], MD;  Location: [facility];  Service: Open Heart Surgery;  Laterality: N/A;  . TONSILLECTOMY     Social History   Socioeconomic History  . Marital status: Single  Spouse name: Not on file  . Number of children: Not on file  . Years of education: 11th grade  . Highest education level: Not on file  Occupational History  . Occupation: unemployed    Fish farm manager: NOT EMPLOYED  Tobacco Use  . Smoking status: Current Every Day Smoker    Packs/day: 0.50    Years: 30.00    Pack years: 15.00    Types: Cigarettes  . Smokeless tobacco: Never Used  . Tobacco comment: 3 cigs per day  Vaping Use  . Vaping Use: Never used  Substance and Sexual Activity  . Alcohol use: No  . Drug use: Yes    Types: Marijuana  . Sexual activity: Not  Currently  Other Topics Concern  . Not on file  Social History Narrative   Lives alone   Drinks a cup of coffee a day   Social Determinants of Health   Financial Resource Strain: Not on file  Food Insecurity: Not on file  Transportation Needs: Not on file  Physical Activity: Not on file  Stress: Not on file  Social Connections: Not on file   Outpatient Encounter Medications as of 09/23/2020  Medication Sig  . albuterol (PROVENTIL HFA;VENTOLIN HFA) 108 (90 Base) MCG/ACT inhaler Inhale 2 puffs into the lungs every 4 (four) hours as needed for wheezing or shortness of breath.   Marland Kitchen amLODipine (NORVASC) 10 MG tablet Take 1 tablet (10 mg total) by mouth daily.  Marland Kitchen atorvastatin (LIPITOR) 40 MG tablet Take 40 mg by mouth daily. Take 1 tablet at bedtime.  . baclofen (LIORESAL) 10 MG tablet Take 10 mg by mouth 2 (two) times daily.  . clopidogrel (PLAVIX) 75 MG tablet Take 75 mg by mouth daily.  . diazepam (VALIUM) 5 MG tablet Take 5 mg by mouth 2 (two) times daily as needed for anxiety.   . divalproex (DEPAKOTE) 500 MG DR tablet Take 3 tablets (1,500 mg total) by mouth 2 (two) times daily.  Marland Kitchen escitalopram (LEXAPRO) 20 MG tablet Take 20 mg by mouth daily.   Marland Kitchen glucose blood (ACCU-CHEK GUIDE) test strip Use to check glucose 4 times a day  . insulin aspart (NOVOLOG) 100 UNIT/ML FlexPen Inject 4-10 Units into the skin 3 (three) times daily with meals.  . insulin glargine (LANTUS SOLOSTAR) 100 UNIT/ML Solostar Pen Inject 40 Units into the skin at bedtime.  . isosorbide mononitrate (IMDUR) 30 MG 24 hr tablet Take 1 tablet (30 mg total) by mouth daily.  Marland Kitchen lamoTRIgine (LAMICTAL) 25 MG tablet Take 50 mg by mouth 2 (two) times daily.   Marland Kitchen lisinopril (ZESTRIL) 20 MG tablet TAKE ONE TABLET BY MOUTH ONCE DAILY.  . Melatonin 10 MG CAPS Take 1 capsule by mouth at bedtime as needed (for sleep).   . nitroGLYCERIN (NITROSTAT) 0.4 MG SL tablet Place 1 tablet (0.4 mg total) under the tongue every 5 (five) minutes as  needed for chest pain.  Marland Kitchen oxyCODONE-acetaminophen (PERCOCET) 10-325 MG tablet Take 1 tablet by mouth every 6 (six) hours as needed for pain.  . paliperidone (INVEGA) 6 MG 24 hr tablet Take 12 mg by mouth daily.  . prazosin (MINIPRESS) 2 MG capsule Take 2-4 mg by mouth at bedtime as needed (for sleep).   . ranolazine (RANEXA) 1000 MG SR tablet Take 1 tablet (1,000 mg total) by mouth 2 (two) times daily.  . risperiDONE (RISPERDAL) 3 MG tablet Take 3 mg by mouth at bedtime.  . [DISCONTINUED] insulin glargine (LANTUS SOLOSTAR) 100 UNIT/ML Solostar Pen Inject  36 Units into the skin at bedtime.   No facility-administered encounter medications on file as of 09/23/2020.   ALLERGIES: Allergies  Allergen Reactions  . Aspirin Swelling and Other (See Comments)    Facial swelling    VACCINATION STATUS: Immunization History  Administered Date(s) Administered  . Influenza,trivalent, recombinat, inj, PF 05/02/2015  . Moderna Sars-Covid-2 Vaccination 11/01/2019, 12/03/2019  . Tdap 01/18/2015    Diabetes He presents for his follow-up diabetic visit. He has type 2 diabetes mellitus. Onset time: He was diagnosed the approximate age of 16 years. His disease course has been worsening. There are no hypoglycemic associated symptoms. Pertinent negatives for hypoglycemia include no confusion, headaches, pallor or seizures. Associated symptoms include polydipsia and polyuria. Pertinent negatives for diabetes include no chest pain, no fatigue, no polyphagia and no weakness. There are no hypoglycemic complications. Symptoms are worsening. Diabetic complications include heart disease. (He is seriously noncompliant patient. He is now diagnosed with coronary artery disease at [facility] since last visit. He is awaiting for his outpatient cardiology evaluation. This assay to be set for 03/24/2016.) Risk factors for coronary artery disease include diabetes mellitus, dyslipidemia, hypertension, male sex, tobacco  exposure and sedentary lifestyle. Current diabetic treatment includes insulin injections. He is compliant with treatment none of the time. His weight is fluctuating minimally. He is following a generally unhealthy diet. When asked about meal planning, he reported none. He has had a previous visit with a dietitian. He never participates in exercise. His home blood glucose trend is increasing steadily. His breakfast blood glucose range is generally >200 mg/dl. His lunch blood glucose range is generally >200 mg/dl. His dinner blood glucose range is generally >200 mg/dl. His bedtime blood glucose range is generally >200 mg/dl. His overall blood glucose range is >200 mg/dl. (He presents with his meter and logs showing persistently above target glycemic profile. His average blood glucose is greater than 300 mg per DL. Despite detailed education and instruction last visit, he still did not utilize his NovoLog properly. He used only the correction part and  missed the standing order of 4 units 3 times daily AC.  His recent previsit labs show A1c of 12.7% increasing from 10.4%. He worries about hypoglycemia, although he did not document any hypoglycemia in his logs nor meter.  ) An ACE inhibitor/angiotensin II receptor blocker is not being taken.  Hyperlipidemia This is a chronic problem. The current episode started more than 1 year ago. Exacerbating diseases include diabetes. Pertinent negatives include no chest pain, myalgias or shortness of breath. Current antihyperlipidemic treatment includes statins. Risk factors for coronary artery disease include diabetes mellitus, dyslipidemia, hypertension, a sedentary lifestyle and male sex.  Hypertension Pertinent negatives include no chest pain, headaches, neck pain, palpitations or shortness of breath. Risk factors for coronary artery disease include smoking/tobacco exposure, diabetes mellitus, dyslipidemia and male gender.     Review of systems  Constitutional: +  Minimally fluctuating body weight,  current  Body mass index is 23.05 kg/m. , no fatigue, no subjective hyperthermia, no subjective hypothermia Musculoskeletal: He is status post left foot surgery, plantar fasciitis, bone spur removal    Objective:    BP 122/78   Pulse 60   Ht 5\' 7"  (1.702 m)   Wt 147 lb 3.2 oz (66.8 kg)   BMI 23.05 kg/m   Wt Readings from Last 3 Encounters:  09/23/20 147 lb 3.2 oz (66.8 kg)  09/14/20 149 lb 14.6 oz (68 kg)  09/09/20 150 lb 6.4 oz (68.2 kg)  Physical Exam- Limited  Constitutional:  Body mass index is 23.05 kg/m. , not in acute distress, + dysphoric state of mind Eyes:  EOMI, no exophthalmos Neck: Supple  Musculoskeletal: Left foot in surgical boot status post recent surgery for plantar fasciitis/bone spur removal.  Chemistry (most recent): Lab Results  Component Value Date   NA 135 09/14/2020   K 4.3 09/14/2020   CL 103 09/14/2020   CO2 27 09/14/2020   BUN 18 09/14/2020   CREATININE 1.24 09/14/2020   Diabetic Labs (most recent): Lab Results  Component Value Date   HGBA1C 12.7 08/27/2020   HGBA1C 10.4 05/08/2020   HGBA1C 9.2 (A) 02/19/2020   Lipid Panel     Component Value Date/Time   CHOL 169 08/27/2020 0000   CHOL 286 (H) 02/12/2020 1334   TRIG 89 08/27/2020 0000   HDL 89 (A) 08/27/2020 0000   HDL 118 02/12/2020 1334   CHOLHDL 2.0 03/22/2016 0853   VLDL 7 03/22/2016 0853   LDLCALC 64 08/27/2020 0000   LDLCALC 145 (H) 02/12/2020 1334    Assessment & Plan:   1. Uncontrolled type 2 diabetes mellitus with complication, with long-term current use of insulin (HCC)  - diabetes is  complicated by Coronary artery disease , CKD , chronic heavy smoking, noncompliance and patient remains at a high risk for more acute and chronic complications of diabetes which include CAD, CVA, CKD, retinopathy, and neuropathy. These are all discussed in detail with the patient.  He presents with his meter and logs showing persistently above  target glycemic profile. His average blood glucose is greater than 300 mg per DL. Despite detailed education and instruction last visit, he still did not utilize his NovoLog properly. He used only the correction part and  missed the standing order of 4 units 3 times daily AC.  His recent previsit labs show A1c of 12.7% increasing from 10.4%. He worries about hypoglycemia, although he did not document any hypoglycemia in his logs nor meter.   -I had a long discussion with him about treatment course of type 2 diabetes in the pathology behind its complications. - I have re-counseled the patient on diet management   by adopting a carbohydrate restricted / protein rich  Diet.  - he acknowledges that there is a room for improvement in his food and drink choices. - Suggestion is made for him to avoid simple carbohydrates  from his diet including Cakes, Sweet Desserts, Ice Cream, Soda (diet and regular), Sweet Tea, Candies, Chips, Cookies, Store Bought Juices, Alcohol in Excess of  1-2 drinks a day, Artificial Sweeteners,  Coffee Creamer, and "Sugar-free" Products, Lemonade. This will help patient to have more stable blood glucose profile and potentially avoid unintended weight gain.   - Patient is advised to stick to a routine mealtimes to eat 3 meals  a day and avoid unnecessary snacks ( to snack only to correct hypoglycemia).   - I have approached patient with the following individualized plan to manage diabetes and patient reluctantly accepts .  - He remains alarmingly noncompliant, does not follow recommendations, not engaged for proper monitoring of blood glucose for safe use of insulin. He did not adapt routine mealtimes as recommended. He is approached to get committed, accepts with reluctance. He worries about hypoglycemia, however did not document any hypoglycemia. He does not want to take NovoLog 8 units 3 times daily AC. --He is advised to increase his Lantus to 40 units nightly (he may need  higher doses only if  he eats breakfast on time), I had another  long discussion again about proper utilization of NovoLog.To get his confidence, I lowered his NovoLog to 4 units  3 times daily AC for pre-meal blood glucose between 90-150 mg/day, plus specific correction for readings above 150 mg per DL, associated with strict monitoring of blood glucose 4 times a day before meals and at bedtime. -He is warned not to take insulin without proper monitoring of blood glucose. -He is encouraged to call clinic for blood glucose readings less than 70 or greater than 300x3.  - He is not a suitable candidate for incretin therapy nor metformin/SG LT 2 inhibitors . - Patient specific target  for A1c; LDL, HDL, Triglycerides, and  Waist Circumference were discussed in detail.  2) BP/HTN:  His blood pressure is controlled to target. He is advised to continue lisinopril 20 mg p.o. daily, amlodipine 10 mg p.o. daily, hydrochlorothiazide 25 mg p.o. daily.  See below as far as smoking.    3)  Hyperlipidemia: His most recent lipid panel showed LDL at 75.  He will continue to benefit from statin intervention.  He is advised to continue atorvastatin 40 mg p.o. nightly.          4) Medical noncompliance: He has significant medical history of noncompliance, I advised him to stay focused on self-care.   5) Chronic Care/Health Maintenance:  -Patient  Is on Statin medications and encouraged to continue to follow up with Ophthalmology, Podiatrist at least yearly or according to recommendations, and advised to quit smoking. I have recommended yearly flu vaccine and pneumonia vaccination at least every 5 years; moderate intensity exercise for up to 150 minutes weekly; and  sleep for at least 7 hours a day.  The patient was counseled on the dangers of tobacco use, and was advised to quit.  Reviewed strategies to maximize success, including removing cigarettes and smoking materials from environment. Not ready or willing to  stop smoking at this time.    He is advised to continue follow-up with his PMD for primary care needs. - Time spent on this patient care encounter:  40 min, of which > 50% was spent in  counseling and the rest reviewing his blood glucose logs , discussing his hypoglycemia and hyperglycemia episodes, reviewing his current and  previous labs / studies  ( including abstraction from other facilities) and medications  doses and developing a  long term treatment plan and documenting his care.   Please refer to Patient Instructions for Blood Glucose Monitoring and Insulin/Medications Dosing Guide"  in media tab for additional information. Please  also refer to " Patient Self Inventory" in the Media  tab for reviewed elements of pertinent patient history.  Lendon Ka Batterton participated in the discussions, expressed understanding, and voiced agreement with the above plans.  All questions were answered to his satisfaction. he is encouraged to contact clinic should he have any questions or concerns prior to his return visit.    Follow up plan: -Return in about 3 months (around 12/21/2020) for F/U with Meter and Logs Only - no Labs.  Glade Lloyd, MD Phone: [phone]  Fax: [phone]   09/23/2020, 11:03 AM

## 2020-09-23 NOTE — Progress Notes (Signed)
Error

## 2020-09-28 ENCOUNTER — Telehealth: Payer: Self-pay | Admitting: Podiatry

## 2020-09-28 NOTE — Telephone Encounter (Signed)
Dr. Posey Pronto- please send pain medications as you feel necessary.

## 2020-09-28 NOTE — Telephone Encounter (Signed)
Patient calling to request another refill on pain medication oxyCODONE-acetaminophen (PERCOCET) 10-325 MG tablet. Patient stated supply written on 2/21 only lasted 5 days and he needs more.

## 2020-09-28 NOTE — Telephone Encounter (Signed)
Patient has requested refill on pain meds, Please Advise 

## 2020-09-29 MED ORDER — OXYCODONE-ACETAMINOPHEN 5-325 MG PO TABS: 1.0000 | ORAL_TABLET | ORAL | 0 refills | Status: DC | PRN

## 2020-09-29 NOTE — Addendum Note (Signed)
Addended by: Boneta Lucks on: 09/29/2020 07:37 AM   Modules accepted: Orders

## 2020-10-05 ENCOUNTER — Telehealth: Payer: Self-pay | Admitting: Podiatry

## 2020-10-05 NOTE — Telephone Encounter (Signed)
Patient has requested refill on pain meds, Please Advise 

## 2020-10-06 ENCOUNTER — Other Ambulatory Visit: Payer: Self-pay | Admitting: Podiatry

## 2020-10-06 MED ORDER — OXYCODONE-ACETAMINOPHEN 5-325 MG PO TABS: 1.0000 | ORAL_TABLET | ORAL | 0 refills | Status: DC | PRN

## 2020-10-09 ENCOUNTER — Ambulatory Visit (INDEPENDENT_AMBULATORY_CARE_PROVIDER_SITE_OTHER): Payer: Medicaid Other | Admitting: Student

## 2020-10-09 ENCOUNTER — Encounter: Payer: Self-pay | Admitting: Student

## 2020-10-09 ENCOUNTER — Other Ambulatory Visit: Payer: Self-pay

## 2020-10-09 VITALS — BP 128/82 | HR 62 | Ht 67.0 in | Wt 147.8 lb

## 2020-10-09 DIAGNOSIS — R002 Palpitations: Secondary | ICD-10-CM | POA: Diagnosis not present

## 2020-10-09 DIAGNOSIS — E785 Hyperlipidemia, unspecified: Secondary | ICD-10-CM

## 2020-10-09 DIAGNOSIS — I10 Essential (primary) hypertension: Secondary | ICD-10-CM

## 2020-10-09 DIAGNOSIS — M25562 Pain in left knee: Secondary | ICD-10-CM

## 2020-10-09 DIAGNOSIS — R079 Chest pain, unspecified: Secondary | ICD-10-CM

## 2020-10-09 DIAGNOSIS — I25708 Atherosclerosis of coronary artery bypass graft(s), unspecified, with other forms of angina pectoris: Secondary | ICD-10-CM

## 2020-10-09 MED ORDER — RANOLAZINE ER 500 MG PO TB12: 500.0000 mg | ORAL_TABLET | Freq: Two times a day (BID) | ORAL | 3 refills | Status: DC

## 2020-10-09 NOTE — Progress Notes (Signed)
Cardiology Office Note    Date:  10/10/2020   ID:  [name], DOB February 09, 1966, MRN [id number]  PCP:  [name], FNP  Cardiologist: Kate Sable, MD (Inactive)  --> needs to switch to new MD  Chief Complaint  Patient presents with  . Follow-up    1 month visit    History of Present Illness:    Melvin Taylor is a 55 y.o. male with past medical history of CAD (s/p CABG in 03/2016 with LIMA-LAD, SVG-D1, SVG-RCA, and Seq SVG-mid and distal OM), HTN, HLD, IDDM, OSA, Bipolar Disorder, Schizophrenia and tobacco use who presents to the office today for 1 month follow-up.  He was last examined by myself in 09/2020 and reported being under increased stress but had experienced significant chest pain and dyspnea the evening prior to his office visit. EMS had been called out but he had refused transport as his pain had resolved upon their arrival. EKG at the time of his visit showed no acute ST changes.  Ranexa was titrated from 500 mg twice daily to 1000 mg twice daily and a repeat Lexiscan Myoview was recommended for ischemic evaluation.  In the interim, he did present to [facility] ED on 09/14/2020 for evaluation of recurrent chest pain. Troponin values were negative and EKG was without acute changes. He was started on Imdur 30 mg daily and discharged home. His stress test was performed on 09/21/2020 and showed normal resting and stress perfusion with no evidence of ischemia and was overall a low risk study.  In talking with the patient today, he reports overall doing well since his stress test and denies any repeat episodes of chest pain. Reports his breathing has overall been stable with no reported orthopnea, PND or lower extremity edema.  He does experience occasional palpitations which he describes as his heart skipping for 1 or 2 beats. Denies any associated symptoms with this. He denies any alcohol use but does consume 2 to 3 cups of coffee in the morning and several  caffeinated sodas throughout the day. Feels like he has been fatigued and having nausea since Ranexa was titrated at his last visit.   Also reports having left knee pain which occurs in the morning or with activity. Requests a referral to Orthopedics.    Past Medical History:  Diagnosis Date  . Anxiety   . Bipolar 1 disorder ([place])   . Bone spur    left heel  . Cervical radiculopathy   . Chest pain   . Chronic back pain   . Chronic chest wall pain   . Chronic neck pain   . Coronary artery disease    a. s/p CABG in 03/2016 with LIMA-LAD, SVG-D1, SVG-RCA, and Seq SVG-mid and distal OM  . Depression   . Diabetes mellitus    Type II  . Hallucinations    "long history of them"  . Headache(784.0)   . History of gout   . HTN (hypertension)   . Insomnia   . Myocardial infarction ([place]) 2017  . Neuropathy   . Pain management   . Polysubstance abuse ([place])   . Right leg pain    chronic  . Schizophrenia ([place])   . Seizures ([place])    last sz between July 5-9th, 2016; epilepsy  . Shortness of breath dyspnea    with exertion  . Sleep apnea   . Transfusion of blood product refused for religious reason     Past Surgical History:  Procedure Laterality  Date  . ANTERIOR CERVICAL DECOMP/DISCECTOMY FUSION N/A 12/14/2015   Procedure: ANTERIOR CERVICAL DECOMPRESSION/DISCECTOMY FUSION CERVICAL FIVE -SIX;  Surgeon: Earnie Larsson, MD;  Location: [facility];  Service: Neurosurgery;  Laterality: N/A;  . BIOPSY  05/07/2015   Procedure: BIOPSY (Gastric);  Surgeon: [name], MD;  Location: [facility];  Service: Endoscopy;;  . CARDIAC CATHETERIZATION N/A 03/07/2016   Procedure: Left Heart Cath and Coronary Angiography;  Surgeon: Belva Crome, MD;  Location: [facility];  Service: Cardiovascular;  Laterality: N/A;  . COLONOSCOPY WITH PROPOFOL N/A 05/07/2015   QQP:[id number] diverticulosis, multiple colon polyps removed, tubular adenoma, serrated colon polyp. Next colonoscopy October 2019  .  COLONOSCOPY WITH PROPOFOL N/A 08/02/2018   Procedure: COLONOSCOPY WITH PROPOFOL;  Surgeon: [name], MD;  Location: [facility];  Service: Endoscopy;  Laterality: N/A;  12:00pm  . CORONARY ARTERY BYPASS GRAFT N/A 03/28/2016   Procedure: CORONARY ARTERY BYPASS GRAFTING (CABG) x 5 USING GREATER SAPHENOUS VEIN;  Surgeon: [name], MD;  Location: [facility];  Service: Open Heart Surgery;  Laterality: N/A;  . ENDOVEIN HARVEST OF GREATER SAPHENOUS VEIN Right 03/28/2016   Procedure: ENDOVEIN HARVEST OF GREATER SAPHENOUS VEIN;  Surgeon: [name], MD;  Location: [place] [facility];  Service: Open Heart Surgery;  Laterality: Right;  . ESOPHAGEAL DILATION N/A 05/07/2015   Procedure: ESOPHAGEAL DILATION WITH 56FR MALONEY DILATOR;  Surgeon: [name], MD;  Location: [facility];  Service: Endoscopy;  Laterality: N/A;  . ESOPHAGOGASTRODUODENOSCOPY (EGD) WITH PROPOFOL N/A 05/07/2015   RMR: Status post dilation of normal esophagus. Gastritis.  Marland Kitchen KNEE SURGERY Left    arthroscopy  . MANDIBLE FRACTURE SURGERY    . POLYPECTOMY  05/07/2015   Procedure: POLYPECTOMY (Hepatic Flexure, Distal Transverse Colon, Rectal);  Surgeon: [name], MD;  Location: [facility];  Service: Endoscopy;;  . TEE WITHOUT CARDIOVERSION N/A 03/28/2016   Procedure: TRANSESOPHAGEAL ECHOCARDIOGRAM (TEE);  Surgeon: [name], MD;  Location: [facility];  Service: Open Heart Surgery;  Laterality: N/A;  . TONSILLECTOMY      Current Medications: Outpatient Medications Prior to Visit  Medication Sig Dispense Refill  . albuterol (PROVENTIL HFA;VENTOLIN HFA) 108 (90 Base) MCG/ACT inhaler Inhale 2 puffs into the lungs every 4 (four) hours as needed for wheezing or shortness of breath.     Marland Kitchen amLODipine (NORVASC) 10 MG tablet Take 1 tablet (10 mg total) by mouth daily. 30 tablet 3  . atorvastatin (LIPITOR) 40 MG tablet Take 40 mg by mouth daily. Take 1 tablet at bedtime.    . baclofen (LIORESAL) 10 MG tablet Take 10 mg by mouth 2 (two) times daily.     . clopidogrel (PLAVIX) 75 MG tablet Take 75 mg by mouth daily.    . diazepam (VALIUM) 5 MG tablet Take 5 mg by mouth 2 (two) times daily as needed for anxiety.     . divalproex (DEPAKOTE) 500 MG DR tablet Take 3 tablets (1,500 mg total) by mouth 2 (two) times daily. 540 tablet 4  . escitalopram (LEXAPRO) 20 MG tablet Take 20 mg by mouth daily.     Marland Kitchen glucose blood (ACCU-CHEK GUIDE) test strip Use to check glucose 4 times a day 150 strip 2  . insulin aspart (NOVOLOG) 100 UNIT/ML FlexPen Inject 4-10 Units into the skin 3 (three) times daily with meals. 15 mL 1  . insulin glargine (LANTUS SOLOSTAR) 100 UNIT/ML Solostar Pen Inject 40 Units into the skin at bedtime. 15 mL 1  .  isosorbide mononitrate (IMDUR) 30 MG 24 hr tablet Take 1 tablet (30 mg total) by mouth daily. 30 tablet 1  . lamoTRIgine (LAMICTAL) 25 MG tablet Take 50 mg by mouth 2 (two) times daily.     Marland Kitchen lisinopril (ZESTRIL) 20 MG tablet TAKE ONE TABLET BY MOUTH ONCE DAILY. 90 tablet 0  . Melatonin 10 MG CAPS Take 1 capsule by mouth at bedtime as needed (for sleep).     . nitroGLYCERIN (NITROSTAT) 0.4 MG SL tablet Place 1 tablet (0.4 mg total) under the tongue every 5 (five) minutes as needed for chest pain. 25 tablet 3  . oxyCODONE-acetaminophen (PERCOCET) 10-325 MG tablet Take 1 tablet by mouth every 6 (six) hours as needed for pain. 15 tablet 0  . oxyCODONE-acetaminophen (PERCOCET) 5-325 MG tablet Take 1-2 tablets by mouth every 4 (four) hours as needed for severe pain. 30 tablet 0  . paliperidone (INVEGA) 6 MG 24 hr tablet Take 12 mg by mouth daily.    . prazosin (MINIPRESS) 2 MG capsule Take 2-4 mg by mouth at bedtime as needed (for sleep).     . risperiDONE (RISPERDAL) 3 MG tablet Take 3 mg by mouth at bedtime.    . ranolazine (RANEXA) 1000 MG SR tablet Take 1 tablet (1,000 mg total) by mouth 2 (two) times daily. 180 tablet 3  . oxyCODONE-acetaminophen (PERCOCET) 5-325 MG tablet Take 1-2 tablets by mouth every 4 (four) hours as needed  for severe pain. 30 tablet 0   No facility-administered medications prior to visit.     Allergies:   Aspirin   Social History   Socioeconomic History  . Marital status: Single    Spouse name: Not on file  . Number of children: Not on file  . Years of education: 11th grade  . Highest education level: Not on file  Occupational History  . Occupation: unemployed    Fish farm manager: NOT EMPLOYED  Tobacco Use  . Smoking status: Current Every Day Smoker    Packs/day: 0.50    Years: 30.00    Pack years: 15.00    Types: Cigarettes  . Smokeless tobacco: Never Used  . Tobacco comment: 3 cigs per day  Vaping Use  . Vaping Use: Never used  Substance and Sexual Activity  . Alcohol use: No  . Drug use: Yes    Types: Marijuana  . Sexual activity: Not Currently  Other Topics Concern  . Not on file  Social History Narrative   Lives alone   Drinks a cup of coffee a day   Social Determinants of Health   Financial Resource Strain: Not on file  Food Insecurity: Not on file  Transportation Needs: Not on file  Physical Activity: Not on file  Stress: Not on file  Social Connections: Not on file     Family History:  The patient's family history includes Arthritis in an other family member; Asthma in an other family member; Diabetes in his father and another family member; Hypertension in his father; Stroke in his sister.   Review of Systems:   Please see the history of present illness.     General:  No chills, fever, night sweats or weight changes. Positive for left knee pain.  Cardiovascular:  No chest pain, dyspnea on exertion, edema, orthopnea, palpitations, paroxysmal nocturnal dyspnea. Dermatological: No rash, lesions/masses Respiratory: No cough, dyspnea Urologic: No hematuria, dysuria Abdominal:   No nausea, vomiting, diarrhea, bright red blood per rectum, melena, or hematemesis Neurologic:  No visual changes, wkns, changes in  mental status. All other systems reviewed and are  otherwise negative except as noted above.   Physical Exam:    VS:  BP 128/82   Pulse 62   Ht 5\' 7"  (1.702 m)   Wt 147 lb 12.8 oz (67 kg)   SpO2 97%   BMI 23.15 kg/m    General: Well developed, well nourished,male appearing in no acute distress. Head: Normocephalic, atraumatic. Neck: No carotid bruits. JVD not elevated.  Lungs: Respirations regular and unlabored, without wheezes or rales.  Heart: Regular rate and rhythm. No S3 or S4.  No murmur, no rubs, or gallops appreciated. Abdomen: Appears non-distended. No obvious abdominal masses. Msk:  Strength and tone appear normal for age. No obvious joint deformities or effusions. Extremities: No clubbing or cyanosis. No lower extremity edema.  Distal pedal pulses are 2+ bilaterally. Neuro: Alert and oriented X 3. Moves all extremities spontaneously. No focal deficits noted. Psych:  Responds to questions appropriately with a normal affect. Skin: No rashes or lesions noted  Wt Readings from Last 3 Encounters:  10/09/20 147 lb 12.8 oz (67 kg)  09/23/20 147 lb 3.2 oz (66.8 kg)  09/14/20 149 lb 14.6 oz (68 kg)     Studies/Labs Reviewed:   EKG:  EKG is not ordered today.    Recent Labs: 02/12/2020: ALT 13 05/08/2020: TSH 0.55 09/14/2020: BUN 18; Creatinine, Ser 1.24; Hemoglobin 12.1; Platelets 172; Potassium 4.3; Sodium 135   Lipid Panel    Component Value Date/Time   CHOL 169 08/27/2020 0000   CHOL 286 (H) 02/12/2020 1334   TRIG 89 08/27/2020 0000   HDL 89 (A) 08/27/2020 0000   HDL 118 02/12/2020 1334   CHOLHDL 2.0 03/22/2016 0853   VLDL 7 03/22/2016 0853   LDLCALC 64 08/27/2020 0000   LDLCALC 145 (H) 02/12/2020 1334    Additional studies/ records that were reviewed today include:   Echocardiogram: 03/2019 IMPRESSIONS    1. The left ventricle has normal systolic function with an ejection  fraction of 60-65%. The cavity size was normal. Mild to moderate  concentric LVH. Left ventricular diastolic Doppler parameters are   indeterminate. No evidence of left ventricular  regional wall motion abnormalities.  2. Right atrial size was mildly dilated.  3. The mitral valve is grossly normal. Mild thickening of the mitral  valve leaflet.  4. The tricuspid valve is grossly normal.  5. The aortic valve is tricuspid.  6. The aorta is normal in size and structure.   NST: 09/2020  The study is normal.  This is a low risk study.  The left ventricular ejection fraction is normal (55-65%).   Normal resting and stress perfusion. No ischemia or infarction EF 56%    Assessment:    1. Coronary artery disease involving coronary bypass graft of native heart with other forms of angina pectoris ([place])   2. Essential hypertension   3. Hyperlipidemia LDL goal <70   4. Palpitations   5. Left knee pain, unspecified chronicity      Plan:   In order of problems listed above:  1. CAD - He is s/p CABG in 03/2016 with LIMA-LAD, SVG-D1, SVG-RCA, and Seq SVG-mid and distal OM. Recent NST last month showed normal resting and stress perfusion with no evidence of ischemia and was overall a low risk study. - He denies any recurrent chest pain but does report fatigue following dose adjustment of Ranexa. Will therefore reduce back to 500mg  BID. Will continue Plavix 75mg  daily (allergic to ASA), Amlodipine  10mg  daily, Atorvastatin 40mg  daily and Imdur 30mg  daily.   2. HTN - BP is well-controlled at 128/82 during today's visit. Continue Amlodipine, Lisinopril and Imdur at current dosing.   3. HLD - LDL was at 64 in 08/2020. Continue Atorvastatin 40mg  daily.   4. Palpitations - He reports occasional "skips" which sound most consistent with PAC's or PVC's. He does consume a significant amount of caffeine and I recommended he try to reduce this. We discussed a heart monitor as well but he wishes to hold off on this for now.   5. Left Knee Pain - He reports symptoms consistent with OA. He is unsure of any prior imaging and  has never had a steroid injection to the site. Will refer to Orthopedics for further evaluation.     Medication Adjustments/Labs and Tests Ordered: Current medicines are reviewed at length with the patient today.  Concerns regarding medicines are outlined above.  Medication changes, Labs and Tests ordered today are listed in the Patient Instructions below. Patient Instructions  Medication Instructions:  DECREASE Renexa to 500 mg twice a day. *If you need a refill on your cardiac medications before your next appointment, please call your pharmacy*   Lab Work: None If you have labs (blood work) drawn today and your tests are completely normal, you will receive your results only by: Marland Kitchen MyChart Message (if you have MyChart) OR . A paper copy in the mail If you have any lab test that is abnormal or we need to change your treatment, we will call you to review the results.   Testing/Procedures: None   Follow-Up: At [facility], you and your health needs are our priority.  As part of our continuing mission to provide you with exceptional heart care, we have created designated Provider Care Teams.  These Care Teams include your primary Cardiologist (physician) and Advanced Practice Providers (APPs -  Physician Assistants and Nurse Practitioners) who all work together to provide you with the care you need, when you need it.  We recommend signing up for the patient portal called "MyChart".  Sign up information is provided on this After Visit Summary.  MyChart is used to connect with patients for Virtual Visits (Telemedicine).  Patients are able to view lab/test results, encounter notes, upcoming appointments, etc.  Non-urgent messages can be sent to your provider as well.   To learn more about what you can do with MyChart, go to [email or web address].    Your next appointment:   6 month(s)  The format for your next appointment:   In Person  Provider:   Bernerd Pho, PA-C or  establish with MD   Other Instructions Referral to Orthopedics- They will contact you       Signed, Erma Heritage, PA-C  10/10/2020 9:17 AM    [facility] [address] [place], [place] [address] Phone: [phone] Fax: [phone]

## 2020-10-09 NOTE — Patient Instructions (Addendum)
Medication Instructions:  DECREASE Renexa to 500 mg twice a day. *If you need a refill on your cardiac medications before your next appointment, please call your pharmacy*   Lab Work: None If you have labs (blood work) drawn today and your tests are completely normal, you will receive your results only by: Marland Kitchen MyChart Message (if you have MyChart) OR . A paper copy in the mail If you have any lab test that is abnormal or we need to change your treatment, we will call you to review the results.   Testing/Procedures: None   Follow-Up: At [facility], you and your health needs are our priority.  As part of our continuing mission to provide you with exceptional heart care, we have created designated Provider Care Teams.  These Care Teams include your primary Cardiologist (physician) and Advanced Practice Providers (APPs -  Physician Assistants and Nurse Practitioners) who all work together to provide you with the care you need, when you need it.  We recommend signing up for the patient portal called "MyChart".  Sign up information is provided on this After Visit Summary.  MyChart is used to connect with patients for Virtual Visits (Telemedicine).  Patients are able to view lab/test results, encounter notes, upcoming appointments, etc.  Non-urgent messages can be sent to your provider as well.   To learn more about what you can do with MyChart, go to [email or web address].    Your next appointment:   6 month(s)  The format for your next appointment:   In Person  Provider:   Bernerd Pho, PA-C or establish with MD   Other Instructions Referral to Orthopedics- They will contact you

## 2020-10-10 ENCOUNTER — Encounter: Payer: Self-pay | Admitting: Student

## 2020-10-12 ENCOUNTER — Telehealth: Payer: Self-pay | Admitting: Podiatry

## 2020-10-12 ENCOUNTER — Other Ambulatory Visit: Payer: Self-pay | Admitting: Student

## 2020-10-12 MED ORDER — OXYCODONE-ACETAMINOPHEN 5-325 MG PO TABS: 1.0000 | ORAL_TABLET | ORAL | 0 refills | Status: DC | PRN

## 2020-10-12 NOTE — Addendum Note (Signed)
Addended by: Boneta Lucks on: 10/12/2020 12:23 PM   Modules accepted: Orders

## 2020-10-12 NOTE — Telephone Encounter (Signed)
Patient is having shoot pain up his leg, wants a refill of pain medication.

## 2020-10-14 ENCOUNTER — Encounter: Payer: Self-pay | Admitting: Podiatry

## 2020-10-14 ENCOUNTER — Ambulatory Visit (INDEPENDENT_AMBULATORY_CARE_PROVIDER_SITE_OTHER): Payer: Medicaid Other | Admitting: Podiatry

## 2020-10-14 ENCOUNTER — Other Ambulatory Visit: Payer: Self-pay

## 2020-10-14 DIAGNOSIS — M722 Plantar fascial fibromatosis: Secondary | ICD-10-CM

## 2020-10-14 DIAGNOSIS — Z9889 Other specified postprocedural states: Secondary | ICD-10-CM

## 2020-10-14 NOTE — Progress Notes (Signed)
Subjective:  Patient ID: Melvin Taylor, male    DOB: 1966/01/12,  MRN: [id number]  Chief Complaint  Patient presents with  . Post-op Problem    PT stated that he is still having shooting pains with the foot he had surgery on     55 y.o. male returns for post-op check.  He is doing well.  He denies any other acute complaints.  He still has tenderness and pain to it.  He has been ambulating with a cam boot.  He has been taking his pain medication.  Review of Systems: Negative except as noted in the HPI. Denies N/V/F/Ch.  Past Medical History:  Diagnosis Date  . Anxiety   . Bipolar 1 disorder ([place])   . Bone spur    left heel  . Cervical radiculopathy   . Chest pain   . Chronic back pain   . Chronic chest wall pain   . Chronic neck pain   . Coronary artery disease    a. s/p CABG in 03/2016 with LIMA-LAD, SVG-D1, SVG-RCA, and Seq SVG-mid and distal OM  . Depression   . Diabetes mellitus    Type II  . Hallucinations    "long history of them"  . Headache(784.0)   . History of gout   . HTN (hypertension)   . Insomnia   . Myocardial infarction ([place]) 2017  . Neuropathy   . Pain management   . Polysubstance abuse ([place])   . Right leg pain    chronic  . Schizophrenia ([place])   . Seizures ([place])    last sz between July 5-9th, 2016; epilepsy  . Shortness of breath dyspnea    with exertion  . Sleep apnea   . Transfusion of blood product refused for religious reason     Current Outpatient Medications:  .  albuterol (PROVENTIL HFA;VENTOLIN HFA) 108 (90 Base) MCG/ACT inhaler, Inhale 2 puffs into the lungs every 4 (four) hours as needed for wheezing or shortness of breath. , Disp: , Rfl:  .  amLODipine (NORVASC) 10 MG tablet, Take 1 tablet (10 mg total) by mouth daily., Disp: 30 tablet, Rfl: 3 .  atorvastatin (LIPITOR) 40 MG tablet, Take 40 mg by mouth daily. Take 1 tablet at bedtime., Disp: , Rfl:  .  baclofen (LIORESAL) 10 MG tablet, Take 10 mg by mouth 2 (two) times daily.,  Disp: , Rfl:  .  clopidogrel (PLAVIX) 75 MG tablet, Take 75 mg by mouth daily., Disp: , Rfl:  .  diazepam (VALIUM) 5 MG tablet, Take 5 mg by mouth 2 (two) times daily as needed for anxiety. , Disp: , Rfl:  .  divalproex (DEPAKOTE) 500 MG DR tablet, Take 3 tablets (1,500 mg total) by mouth 2 (two) times daily., Disp: 540 tablet, Rfl: 4 .  escitalopram (LEXAPRO) 20 MG tablet, Take 20 mg by mouth daily. , Disp: , Rfl:  .  glucose blood (ACCU-CHEK GUIDE) test strip, Use to check glucose 4 times a day, Disp: 150 strip, Rfl: 2 .  insulin aspart (NOVOLOG) 100 UNIT/ML FlexPen, Inject 4-10 Units into the skin 3 (three) times daily with meals., Disp: 15 mL, Rfl: 1 .  insulin glargine (LANTUS SOLOSTAR) 100 UNIT/ML Solostar Pen, Inject 40 Units into the skin at bedtime., Disp: 15 mL, Rfl: 1 .  isosorbide mononitrate (IMDUR) 30 MG 24 hr tablet, Take 1 tablet (30 mg total) by mouth daily., Disp: 30 tablet, Rfl: 1 .  lamoTRIgine (LAMICTAL) 25 MG tablet, Take 50 mg by mouth  2 (two) times daily. , Disp: , Rfl:  .  lisinopril (ZESTRIL) 20 MG tablet, TAKE ONE TABLET BY MOUTH ONCE DAILY., Disp: 90 tablet, Rfl: 3 .  Melatonin 10 MG CAPS, Take 1 capsule by mouth at bedtime as needed (for sleep). , Disp: , Rfl:  .  nitroGLYCERIN (NITROSTAT) 0.4 MG SL tablet, Place 1 tablet (0.4 mg total) under the tongue every 5 (five) minutes as needed for chest pain., Disp: 25 tablet, Rfl: 3 .  oxyCODONE-acetaminophen (PERCOCET) 10-325 MG tablet, Take 1 tablet by mouth every 6 (six) hours as needed for pain., Disp: 15 tablet, Rfl: 0 .  oxyCODONE-acetaminophen (PERCOCET) 5-325 MG tablet, Take 1-2 tablets by mouth every 4 (four) hours as needed for severe pain., Disp: 30 tablet, Rfl: 0 .  oxyCODONE-acetaminophen (PERCOCET) 5-325 MG tablet, Take 1-2 tablets by mouth every 4 (four) hours as needed for severe pain., Disp: 30 tablet, Rfl: 0 .  paliperidone (INVEGA) 6 MG 24 hr tablet, Take 12 mg by mouth daily., Disp: , Rfl:  .  prazosin  (MINIPRESS) 2 MG capsule, Take 2-4 mg by mouth at bedtime as needed (for sleep). , Disp: , Rfl:  .  ranolazine (RANEXA) 500 MG 12 hr tablet, Take 1 tablet (500 mg total) by mouth 2 (two) times daily., Disp: 180 tablet, Rfl: 3 .  risperiDONE (RISPERDAL) 3 MG tablet, Take 3 mg by mouth at bedtime., Disp: , Rfl:   Social History   Tobacco Use  Smoking Status Current Every Day Smoker  . Packs/day: 0.50  . Years: 30.00  . Pack years: 15.00  . Types: Cigarettes  Smokeless Tobacco Never Used  Tobacco Comment   3 cigs per day    Allergies  Allergen Reactions  . Aspirin Swelling and Other (See Comments)    Facial swelling    Objective:  There were no vitals filed for this visit. There is no height or weight on file to calculate BMI. Constitutional Well developed. Well nourished.  Vascular Foot warm and well perfused. Capillary refill normal to all digits.   Neurologic Normal speech. Oriented to person, place, and time. Epicritic sensation to light touch grossly present bilaterally.  Dermatologic  skin completely epithelialized.  Mild pain on palpation to the incision site.  No tonic plantar fascia noted.  Orthopedic: Tenderness to palpation noted about the surgical site.   Radiographs: None Assessment:   1. Plantar fasciitis of left foot   2. Status post foot surgery    Plan:  Patient was evaluated and treated and all questions answered.  S/p foot surgery left -Progressing as expected post-operatively. -XR: None -WB Status: Weightbearing as tolerated in regular shoes -Sutures: None -Medications: None -I discussed with him the importance of shoe gear modification with over-the-counter insoles.  I believe patient will benefit from power steps insoles as well as to making changes to his shoes with new balance.  No follow-ups on file.

## 2020-10-16 ENCOUNTER — Ambulatory Visit: Payer: Medicaid Other | Admitting: Orthopedic Surgery

## 2020-10-17 ENCOUNTER — Other Ambulatory Visit: Payer: Self-pay | Admitting: Podiatry

## 2020-10-17 MED ORDER — OXYCODONE-ACETAMINOPHEN 5-325 MG PO TABS
1.0000 | ORAL_TABLET | ORAL | 0 refills | Status: DC | PRN
Start: 2020-10-17 — End: 2020-12-12

## 2020-10-19 NOTE — Telephone Encounter (Signed)
Please advise 

## 2020-10-21 ENCOUNTER — Telehealth: Payer: Self-pay | Admitting: Podiatry

## 2020-10-21 ENCOUNTER — Ambulatory Visit: Payer: Medicaid Other | Admitting: Orthopedic Surgery

## 2020-10-21 ENCOUNTER — Other Ambulatory Visit: Payer: Self-pay | Admitting: Podiatry

## 2020-10-21 NOTE — Telephone Encounter (Signed)
Please advise 

## 2020-10-21 NOTE — Telephone Encounter (Signed)
Patient has requested refill on pain meds, Also he is currently at PCP regarding diabetic shoes, Please Advise

## 2020-10-22 ENCOUNTER — Other Ambulatory Visit: Payer: Self-pay | Admitting: Podiatry

## 2020-10-22 MED ORDER — OXYCODONE-ACETAMINOPHEN 5-325 MG PO TABS: 1.0000 | ORAL_TABLET | ORAL | 0 refills | Status: DC | PRN

## 2020-10-27 ENCOUNTER — Telehealth: Payer: Self-pay | Admitting: Podiatry

## 2020-10-27 ENCOUNTER — Encounter: Payer: Self-pay | Admitting: Orthopedic Surgery

## 2020-10-27 ENCOUNTER — Ambulatory Visit (INDEPENDENT_AMBULATORY_CARE_PROVIDER_SITE_OTHER): Payer: Medicaid Other | Admitting: Orthopedic Surgery

## 2020-10-27 ENCOUNTER — Other Ambulatory Visit: Payer: Self-pay | Admitting: Podiatry

## 2020-10-27 ENCOUNTER — Ambulatory Visit: Payer: Medicaid Other

## 2020-10-27 ENCOUNTER — Other Ambulatory Visit: Payer: Self-pay

## 2020-10-27 VITALS — BP 173/104 | HR 54 | Ht 67.0 in | Wt 150.0 lb

## 2020-10-27 DIAGNOSIS — M1712 Unilateral primary osteoarthritis, left knee: Secondary | ICD-10-CM

## 2020-10-27 DIAGNOSIS — M25562 Pain in left knee: Secondary | ICD-10-CM

## 2020-10-27 DIAGNOSIS — G8929 Other chronic pain: Secondary | ICD-10-CM

## 2020-10-27 MED ORDER — OXYCODONE-ACETAMINOPHEN 5-325 MG PO TABS: 1.0000 | ORAL_TABLET | ORAL | 0 refills | Status: DC | PRN

## 2020-10-27 NOTE — Telephone Encounter (Signed)
Patient requesting a refill on pain medication 

## 2020-10-27 NOTE — Telephone Encounter (Signed)
Please advise 

## 2020-10-27 NOTE — Progress Notes (Signed)
New Patient Visit  Assessment: Melvin Taylor is a 55 y.o. male with the following: Chronic pain of left knee; mild to moderate left knee arthritis  Plan: [name] has diffuse left knee pain.  Radiographs demonstrate mild to moderate loss of joint space, medial more than lateral.  On physical exam, he has tenderness along the medial and lateral joint lines, posterior knee and behind the patella with flexion.  He takes ibuprofen currently, with little relief.  He walks as part of his cardiac rehab, but no additional exercises.  I have recommended a steroid injection and home exercise program for his pain.  He demonstrates left thigh atrophy.  His pain is most consistent with degenerative changes.  The injection can help with his pain, but exercises for his knee will provide long term.  I stressed to him that there is no surgical intervention warranted at this time.  I do anticipate that his improvement will be gradual, and will require a commitment from the patient to improve his strength and mobility.  Follow-up as needed.   Procedure note injection - Left Knee joint   Verbal consent was obtained to inject the Left Knee joint  Timeout was completed to confirm the site of injection.  The skin was prepped with alcohol and ethyl chloride was sprayed at the injection site.  A 21-gauge needle was used to inject 6 mg of Betamethasone and 1% lidocaine (3 cc) into the Left Knee using an Anterolateral approach.  There were no complications. A sterile bandage was applied.   Follow-up: Return if symptoms worsen or fail to improve.  Subjective:  Chief Complaint  Patient presents with  . Knee Pain    Left knee, patient reports it feels like a shooting pain down to his foot,     History of Present Illness: Melvin Taylor is a 55 y.o. male who has been referred to clinic today by Bernerd Pho, PA-C for evaluation of left knee pain.  He states he had pain in his left knee for over a year.   He denies a specific injury.  Nothing caused his pain recently.  He states that his left knee does not swell.  He has not done any physical therapy.  He is currently taking ibuprofen, but this is not helping with his pain.  No previous injections.  Of note, he has a recent heart history, as well as surgery, and states he has been walking as part of his recovery, but has to take frequent stops due to the pain in his left knee.  His pain is diffuse.  He denies mechanical symptoms.  In addition, he has had more than one procedure on his left foot recently.  He has a history of seizures, and states he has not had one in several years.  However, I saw the patient in clinic approximately 6 months ago for a left shoulder complaint following a seizure.  Review of Systems: No fevers or chills No numbness or tingling No shortness of breath No bowel or bladder dysfunction No GI distress No headaches   Medical History:  Past Medical History:  Diagnosis Date  . Anxiety   . Bipolar 1 disorder ([place])   . Bone spur    left heel  . Cervical radiculopathy   . Chest pain   . Chronic back pain   . Chronic chest wall pain   . Chronic neck pain   . Coronary artery disease    a. s/p CABG in 03/2016  with LIMA-LAD, SVG-D1, SVG-RCA, and Seq SVG-mid and distal OM  . Depression   . Diabetes mellitus    Type II  . Hallucinations    "long history of them"  . Headache(784.0)   . History of gout   . HTN (hypertension)   . Insomnia   . Myocardial infarction ([place]) 2017  . Neuropathy   . Pain management   . Polysubstance abuse ([place])   . Right leg pain    chronic  . Schizophrenia ([place])   . Seizures ([place])    last sz between July 5-9th, 2016; epilepsy  . Shortness of breath dyspnea    with exertion  . Sleep apnea   . Transfusion of blood product refused for religious reason     Past Surgical History:  Procedure Laterality Date  . ANTERIOR CERVICAL DECOMP/DISCECTOMY FUSION N/A 12/14/2015   Procedure:  ANTERIOR CERVICAL DECOMPRESSION/DISCECTOMY FUSION CERVICAL FIVE -SIX;  Surgeon: Earnie Larsson, MD;  Location: [facility];  Service: Neurosurgery;  Laterality: N/A;  . BIOPSY  05/07/2015   Procedure: BIOPSY (Gastric);  Surgeon: [name], MD;  Location: [facility];  Service: Endoscopy;;  . CARDIAC CATHETERIZATION N/A 03/07/2016   Procedure: Left Heart Cath and Coronary Angiography;  Surgeon: Belva Crome, MD;  Location: [facility];  Service: Cardiovascular;  Laterality: N/A;  . COLONOSCOPY WITH PROPOFOL N/A 05/07/2015   DGL:OVFIEPPIRJ diverticulosis, multiple colon polyps removed, tubular adenoma, serrated colon polyp. Next colonoscopy October 2019  . COLONOSCOPY WITH PROPOFOL N/A 08/02/2018   Procedure: COLONOSCOPY WITH PROPOFOL;  Surgeon: [name], MD;  Location: [facility];  Service: Endoscopy;  Laterality: N/A;  12:00pm  . CORONARY ARTERY BYPASS GRAFT N/A 03/28/2016   Procedure: CORONARY ARTERY BYPASS GRAFTING (CABG) x 5 USING GREATER SAPHENOUS VEIN;  Surgeon: [name], MD;  Location: [facility];  Service: Open Heart Surgery;  Laterality: N/A;  . ENDOVEIN HARVEST OF GREATER SAPHENOUS VEIN Right 03/28/2016   Procedure: ENDOVEIN HARVEST OF GREATER SAPHENOUS VEIN;  Surgeon: [name], MD;  Location: [facility];  Service: Open Heart Surgery;  Laterality: Right;  . ESOPHAGEAL DILATION N/A 05/07/2015   Procedure: ESOPHAGEAL DILATION WITH 56FR MALONEY DILATOR;  Surgeon: [name], MD;  Location: [facility];  Service: Endoscopy;  Laterality: N/A;  . ESOPHAGOGASTRODUODENOSCOPY (EGD) WITH PROPOFOL N/A 05/07/2015   RMR: Status post dilation of normal esophagus. Gastritis.  Marland Kitchen KNEE SURGERY Left    arthroscopy  . MANDIBLE FRACTURE SURGERY    . POLYPECTOMY  05/07/2015   Procedure: POLYPECTOMY (Hepatic Flexure, Distal Transverse Colon, Rectal);  Surgeon: [name], MD;  Location: [facility];  Service: Endoscopy;;  . TEE WITHOUT CARDIOVERSION N/A 03/28/2016   Procedure: TRANSESOPHAGEAL  ECHOCARDIOGRAM (TEE);  Surgeon: [name], MD;  Location: [facility];  Service: Open Heart Surgery;  Laterality: N/A;  . TONSILLECTOMY      Family History  Problem Relation Age of Onset  . Diabetes Father   . Hypertension Father   . Arthritis Other   . Diabetes Other   . Asthma Other   . Stroke Sister    Social History   Tobacco Use  . Smoking status: Current Every Day Smoker    Packs/day: 0.50    Years: 30.00    Pack years: 15.00    Types: Cigarettes  . Smokeless tobacco: Never Used  . Tobacco comment: 3 cigs per day  Vaping Use  . Vaping Use: Never used  Substance Use Topics  . Alcohol  use: No  . Drug use: Yes    Types: Marijuana    Allergies  Allergen Reactions  . Aspirin Swelling and Other (See Comments)    Facial swelling     Current Meds  Medication Sig  . albuterol (PROVENTIL HFA;VENTOLIN HFA) 108 (90 Base) MCG/ACT inhaler Inhale 2 puffs into the lungs every 4 (four) hours as needed for wheezing or shortness of breath.   Marland Kitchen amLODipine (NORVASC) 10 MG tablet Take 1 tablet (10 mg total) by mouth daily.  Marland Kitchen atorvastatin (LIPITOR) 40 MG tablet Take 40 mg by mouth daily. Take 1 tablet at bedtime.  . baclofen (LIORESAL) 10 MG tablet Take 10 mg by mouth 2 (two) times daily.  . clopidogrel (PLAVIX) 75 MG tablet Take 75 mg by mouth daily.  . diazepam (VALIUM) 5 MG tablet Take 5 mg by mouth 2 (two) times daily as needed for anxiety.   . divalproex (DEPAKOTE) 500 MG DR tablet Take 3 tablets (1,500 mg total) by mouth 2 (two) times daily.  Marland Kitchen escitalopram (LEXAPRO) 20 MG tablet Take 20 mg by mouth daily.   Marland Kitchen glucose blood (ACCU-CHEK GUIDE) test strip Use to check glucose 4 times a day  . insulin aspart (NOVOLOG) 100 UNIT/ML FlexPen Inject 4-10 Units into the skin 3 (three) times daily with meals.  . insulin glargine (LANTUS SOLOSTAR) 100 UNIT/ML Solostar Pen Inject 40 Units into the skin at bedtime.  . isosorbide mononitrate (IMDUR) 30 MG 24 hr tablet Take 1 tablet (30 mg  total) by mouth daily.  Marland Kitchen lamoTRIgine (LAMICTAL) 25 MG tablet Take 50 mg by mouth 2 (two) times daily.   Marland Kitchen lisinopril (ZESTRIL) 20 MG tablet TAKE ONE TABLET BY MOUTH ONCE DAILY.  . Melatonin 10 MG CAPS Take 1 capsule by mouth at bedtime as needed (for sleep).   Marland Kitchen oxyCODONE-acetaminophen (PERCOCET) 10-325 MG tablet Take 1 tablet by mouth every 6 (six) hours as needed for pain.  Marland Kitchen oxyCODONE-acetaminophen (PERCOCET) 5-325 MG tablet Take 1-2 tablets by mouth every 4 (four) hours as needed for severe pain.  Marland Kitchen oxyCODONE-acetaminophen (PERCOCET) 5-325 MG tablet Take 1-2 tablets by mouth every 4 (four) hours as needed for severe pain.  Marland Kitchen oxyCODONE-acetaminophen (PERCOCET) 5-325 MG tablet Take 1-2 tablets by mouth every 4 (four) hours as needed for severe pain.  Marland Kitchen oxyCODONE-acetaminophen (PERCOCET) 5-325 MG tablet Take 1-2 tablets by mouth every 4 (four) hours as needed for severe pain.  . paliperidone (INVEGA) 6 MG 24 hr tablet Take 12 mg by mouth daily.  . prazosin (MINIPRESS) 2 MG capsule Take 2-4 mg by mouth at bedtime as needed (for sleep).   . ranolazine (RANEXA) 500 MG 12 hr tablet Take 1 tablet (500 mg total) by mouth 2 (two) times daily.  . risperiDONE (RISPERDAL) 3 MG tablet Take 3 mg by mouth at bedtime.    Objective: BP (!) 173/104   Pulse (!) 54   Ht 5\' 7"  (1.702 m)   Wt 150 lb (68 kg)   BMI 23.49 kg/m   Physical Exam:  General: Alert and oriented.  No acute distress.  Thin male Gait: Left-sided antalgic gait.  Evaluation of left knee demonstrates no effusion.  He has diffuse tenderness along both the medial, lateral joint lines, as well as the posterior knee.  Pain in the anterior knee with range of motion, specifically flexion.  Negative Lachman.  No increased laxity to varus or valgus stress.  Sensation is intact distally.  Active dorsiflexion of his ankle and his great  toe.    IMAGING: I personally ordered and reviewed the following images   X-ray of the left knee was  obtained in clinic today and demonstrates moderate loss of joint space within the medial compartment specifically.  Mild loss of joint space within the lateral compartment.  Small osteophytes within the medial compartment.  Impression: Mild to moderate degenerative changes, most severe within the medial compartment.   New Medications:  No orders of the defined types were placed in this encounter.     Mordecai Rasmussen, MD  10/27/2020 12:29 PM

## 2020-10-27 NOTE — Patient Instructions (Signed)
Instructions Following Joint Injections  In clinic today, you received an injection in one of your joints (sometimes more than one).  Occasionally, you can have some pain at the injection site, this is normal.  You can place ice at the injection site, or take over-the-counter medications such as Tylenol (acetaminophen) or Advil (ibuprofen).  Please follow all directions listed on the bottle.  If your joint (knee or shoulder) becomes swollen, red or very painful, please contact the clinic for additional assistance.   Two medications were injected, including lidocaine and a steroid (often referred to as cortisone).  Lidocaine is effective almost immediately but wears off quickly.  However, the steroid can take a few days to improve your symptoms.  In some cases, it can make your pain worse for a couple of days.  Do not be concerned if this happens as it is common.  You can apply ice or take some over-the-counter medications as needed.     Knee Exercises  Ask your health care provider which exercises are safe for you. Do exercises exactly as told by your health care provider and adjust them as directed. It is normal to feel mild stretching, pulling, tightness, or discomfort as you do these exercises. Stop right away if you feel sudden pain or your pain gets worse. Do not begin these exercises until told by your health care provider.  Stretching and range-of-motion exercises These exercises warm up your muscles and joints and improve the movement and flexibility of your knee. These exercises also help to relieve pain and swelling.  Knee extension, prone 1. Lie on your abdomen (prone position) on a bed. 2. Place your left / right knee just beyond the edge of the surface so your knee is not on the bed. You can put a towel under your left / right thigh just above your kneecap for comfort. 3. Relax your leg muscles and allow gravity to straighten your knee (extension). You should feel a stretch behind  your left / right knee. 4. Hold this position for 10 seconds. 5. Scoot up so your knee is supported between repetitions. Repeat 10 times. Complete this exercise 3-4 times per week.     Knee flexion, active 1. Lie on your back with both legs straight. If this causes back discomfort, bend your left / right knee so your foot is flat on the floor. 2. Slowly slide your left / right heel back toward your buttocks. Stop when you feel a gentle stretch in the front of your knee or thigh (flexion). 3. Hold this position for 10 seconds. 4. Slowly slide your left / right heel back to the starting position. Repeat 10 times. Complete this exercise 3-4 times per week.      Quadriceps stretch, prone 1. Lie on your abdomen on a firm surface, such as a bed or padded floor. 2. Bend your left / right knee and hold your ankle. If you cannot reach your ankle or pant leg, loop a belt around your foot and grab the belt instead. 3. Gently pull your heel toward your buttocks. Your knee should not slide out to the side. You should feel a stretch in the front of your thigh and knee (quadriceps). 4. Hold this position for 10 seconds. Repeat 10 times. Complete this exercise 3-4 times per week.      Hamstring, supine 1. Lie on your back (supine position). 2. Loop a belt or towel over the ball of your left / right foot. The ball of  your foot is on the walking surface, right under your toes. 3. Straighten your left / right knee and slowly pull on the belt to raise your leg until you feel a gentle stretch behind your knee (hamstring). ? Do not let your knee bend while you do this. ? Keep your other leg flat on the floor. 4. Hold this position for 10 seconds. Repeat 10 times. Complete this exercise 3-4 times per week.   Strengthening exercises These exercises build strength and endurance in your knee. Endurance is the ability to use your muscles for a long time, even after they get tired.  Quadriceps, isometric This  exercise stretches the muscles in front of your thigh (quadriceps) without moving your knee joint (isometric). 1. Lie on your back with your left / right leg extended and your other knee bent. Put a rolled towel or small pillow under your knee if told by your health care provider. 2. Slowly tense the muscles in the front of your left / right thigh. You should see your kneecap slide up toward your hip or see increased dimpling just above the knee. This motion will push the back of the knee toward the floor. 3. For 10 seconds, hold the muscle as tight as you can without increasing your pain. 4. Relax the muscles slowly and completely. Repeat 10 times. Complete this exercise 3-4 times per week. .     Straight leg raises This exercise stretches the muscles in front of your thigh (quadriceps) and the muscles that move your hips (hip flexors). 1. Lie on your back with your left / right leg extended and your other knee bent. 2. Tense the muscles in the front of your left / right thigh. You should see your kneecap slide up or see increased dimpling just above the knee. Your thigh may even shake a bit. 3. Keep these muscles tight as you raise your leg 4-6 inches (10-15 cm) off the floor. Do not let your knee bend. 4. Hold this position for 10 seconds. 5. Keep these muscles tense as you lower your leg. 6. Relax your muscles slowly and completely after each repetition. Repeat 10 times. Complete this exercise 3-4 times per week.  Hamstring, isometric 1. Lie on your back on a firm surface. 2. Bend your left / right knee about 30 degrees. 3. Dig your left / right heel into the surface as if you are trying to pull it toward your buttocks. Tighten the muscles in the back of your thighs (hamstring) to "dig" as hard as you can without increasing any pain. 4. Hold this position for 10 seconds. 5. Release the tension gradually and allow your muscles to relax completely for __________ seconds after each  repetition. Repeat 10 times. Complete this exercise 3-4 times per week.  Hamstring curls If told by your health care provider, do this exercise while wearing ankle weights. Begin with 5 lb weights. Then increase the weight by 1 lb (0.5 kg) increments. You can also use an exercise band 1. Lie on your abdomen with your legs straight. 2. Bend your left / right knee as far as you can without feeling pain. Keep your hips flat against the floor. 3. Hold this position for 10 seconds. 4. Slowly lower your leg to the starting position. Repeat 10 times. Complete this exercise 3-4 times per week.      Squats This exercise strengthens the muscles in front of your thigh and knee (quadriceps). 1. Stand in front of a table, with  your feet and knees pointing straight ahead. You may rest your hands on the table for balance but not for support. 2. Slowly bend your knees and lower your hips like you are going to sit in a chair. ? Keep your weight over your heels, not over your toes. ? Keep your lower legs upright so they are parallel with the table legs. ? Do not let your hips go lower than your knees. ? Do not bend lower than told by your health care provider. ? If your knee pain increases, do not bend as low. 3. Hold the squat position for 10 seconds. 4. Slowly push with your legs to return to standing. Do not use your hands to pull yourself to standing. Repeat 10 times. Complete this exercise 3-4 times per week .     Wall slides This exercise strengthens the muscles in front of your thigh and knee (quadriceps). 1. Lean your back against a smooth wall or door, and walk your feet out 18-24 inches (46-61 cm) from it. 2. Place your feet hip-width apart. 3. Slowly slide down the wall or door until your knees bend 90 degrees. Keep your knees over your heels, not over your toes. Keep your knees in line with your hips. 4. Hold this position for 10 seconds. Repeat 10 times. Complete this exercise 3-4 times  per week.      Straight leg raises This exercise strengthens the muscles that rotate the leg at the hip and move it away from your body (hip abductors). 1. Lie on your side with your left / right leg in the top position. Lie so your head, shoulder, knee, and hip line up. You may bend your bottom knee to help you keep your balance. 2. Roll your hips slightly forward so your hips are stacked directly over each other and your left / right knee is facing forward. 3. Leading with your heel, lift your top leg 4-6 inches (10-15 cm). You should feel the muscles in your outer hip lifting. ? Do not let your foot drift forward. ? Do not let your knee roll toward the ceiling. 4. Hold this position for 10 seconds. 5. Slowly return your leg to the starting position. 6. Let your muscles relax completely after each repetition. Repeat 10 times. Complete this exercise 3-4 times per week.      Straight leg raises This exercise stretches the muscles that move your hips away from the front of the pelvis (hip extensors). 1. Lie on your abdomen on a firm surface. You can put a pillow under your hips if that is more comfortable. 2. Tense the muscles in your buttocks and lift your left / right leg about 4-6 inches (10-15 cm). Keep your knee straight as you lift your leg. 3. Hold this position for 10 seconds. 4. Slowly lower your leg to the starting position. 5. Let your leg relax completely after each repetition. Repeat 10 times. Complete this exercise 3-4 times per week.

## 2020-10-27 NOTE — Telephone Encounter (Signed)
Pt left message stating he is needing the paperwork for him to get diabetic shoes completed by Dr Posey Pronto.   I verified and pts insurance does not cover them in our office. He needs to go to [facility] or [facility] in [place]. HE just needs a rx to go to one of these places.

## 2020-10-28 ENCOUNTER — Ambulatory Visit (INDEPENDENT_AMBULATORY_CARE_PROVIDER_SITE_OTHER): Payer: Medicaid Other | Admitting: Podiatry

## 2020-10-28 ENCOUNTER — Telehealth: Payer: Self-pay | Admitting: Podiatry

## 2020-10-28 ENCOUNTER — Encounter: Payer: Self-pay | Admitting: Podiatry

## 2020-10-28 DIAGNOSIS — M722 Plantar fascial fibromatosis: Secondary | ICD-10-CM

## 2020-10-28 DIAGNOSIS — E1142 Type 2 diabetes mellitus with diabetic polyneuropathy: Secondary | ICD-10-CM

## 2020-10-28 DIAGNOSIS — M2041 Other hammer toe(s) (acquired), right foot: Secondary | ICD-10-CM

## 2020-10-28 DIAGNOSIS — Z9889 Other specified postprocedural states: Secondary | ICD-10-CM

## 2020-10-28 DIAGNOSIS — M2042 Other hammer toe(s) (acquired), left foot: Secondary | ICD-10-CM

## 2020-10-28 NOTE — Telephone Encounter (Signed)
ty

## 2020-10-28 NOTE — Telephone Encounter (Signed)
done

## 2020-10-28 NOTE — Progress Notes (Addendum)
Subjective:  Patient ID: Melvin Taylor, male    DOB: Dec 28, 1965,  MRN: [id number]  Chief Complaint  Patient presents with  . Routine Post Op    PT stated that he is still having pain      55 y.o. male returns for post-op check.  He is doing well.  He denies any other acute complaints.  He still has tenderness and pain to it.  He has been ambulating in regular shoes.  He has been taking his pain medication.  Review of Systems: Negative except as noted in the HPI. Denies N/V/F/Ch.  Past Medical History:  Diagnosis Date  . Anxiety   . Bipolar 1 disorder ([place])   . Bone spur    left heel  . Cervical radiculopathy   . Chest pain   . Chronic back pain   . Chronic chest wall pain   . Chronic neck pain   . Coronary artery disease    a. s/p CABG in 03/2016 with LIMA-LAD, SVG-D1, SVG-RCA, and Seq SVG-mid and distal OM  . Depression   . Diabetes mellitus    Type II  . Hallucinations    "long history of them"  . Headache(784.0)   . History of gout   . HTN (hypertension)   . Insomnia   . Myocardial infarction ([place]) 2017  . Neuropathy   . Pain management   . Polysubstance abuse ([place])   . Right leg pain    chronic  . Schizophrenia ([place])   . Seizures ([place])    last sz between July 5-9th, 2016; epilepsy  . Shortness of breath dyspnea    with exertion  . Sleep apnea   . Transfusion of blood product refused for religious reason     Current Outpatient Medications:  .  albuterol (PROVENTIL HFA;VENTOLIN HFA) 108 (90 Base) MCG/ACT inhaler, Inhale 2 puffs into the lungs every 4 (four) hours as needed for wheezing or shortness of breath. , Disp: , Rfl:  .  amLODipine (NORVASC) 10 MG tablet, Take 1 tablet (10 mg total) by mouth daily., Disp: 30 tablet, Rfl: 3 .  atorvastatin (LIPITOR) 40 MG tablet, Take 40 mg by mouth daily. Take 1 tablet at bedtime., Disp: , Rfl:  .  baclofen (LIORESAL) 10 MG tablet, Take 10 mg by mouth 2 (two) times daily., Disp: , Rfl:  .  clopidogrel (PLAVIX) 75  MG tablet, Take 75 mg by mouth daily., Disp: , Rfl:  .  diazepam (VALIUM) 5 MG tablet, Take 5 mg by mouth 2 (two) times daily as needed for anxiety. , Disp: , Rfl:  .  divalproex (DEPAKOTE) 500 MG DR tablet, Take 3 tablets (1,500 mg total) by mouth 2 (two) times daily., Disp: 540 tablet, Rfl: 4 .  escitalopram (LEXAPRO) 20 MG tablet, Take 20 mg by mouth daily. , Disp: , Rfl:  .  glucose blood (ACCU-CHEK GUIDE) test strip, Use to check glucose 4 times a day, Disp: 150 strip, Rfl: 2 .  insulin aspart (NOVOLOG) 100 UNIT/ML FlexPen, Inject 4-10 Units into the skin 3 (three) times daily with meals., Disp: 15 mL, Rfl: 1 .  insulin glargine (LANTUS SOLOSTAR) 100 UNIT/ML Solostar Pen, Inject 40 Units into the skin at bedtime., Disp: 15 mL, Rfl: 1 .  isosorbide mononitrate (IMDUR) 30 MG 24 hr tablet, Take 1 tablet (30 mg total) by mouth daily., Disp: 30 tablet, Rfl: 1 .  lamoTRIgine (LAMICTAL) 25 MG tablet, Take 50 mg by mouth 2 (two) times daily. , Disp: ,  Rfl:  .  lisinopril (ZESTRIL) 20 MG tablet, TAKE ONE TABLET BY MOUTH ONCE DAILY., Disp: 90 tablet, Rfl: 3 .  Melatonin 10 MG CAPS, Take 1 capsule by mouth at bedtime as needed (for sleep). , Disp: , Rfl:  .  nitroGLYCERIN (NITROSTAT) 0.4 MG SL tablet, Place 1 tablet (0.4 mg total) under the tongue every 5 (five) minutes as needed for chest pain., Disp: 25 tablet, Rfl: 3 .  oxyCODONE-acetaminophen (PERCOCET) 10-325 MG tablet, Take 1 tablet by mouth every 6 (six) hours as needed for pain., Disp: 15 tablet, Rfl: 0 .  oxyCODONE-acetaminophen (PERCOCET) 5-325 MG tablet, Take 1-2 tablets by mouth every 4 (four) hours as needed for severe pain., Disp: 30 tablet, Rfl: 0 .  oxyCODONE-acetaminophen (PERCOCET) 5-325 MG tablet, Take 1-2 tablets by mouth every 4 (four) hours as needed for severe pain., Disp: 30 tablet, Rfl: 0 .  oxyCODONE-acetaminophen (PERCOCET) 5-325 MG tablet, Take 1-2 tablets by mouth every 4 (four) hours as needed for severe pain., Disp: 30 tablet,  Rfl: 0 .  oxyCODONE-acetaminophen (PERCOCET) 5-325 MG tablet, Take 1-2 tablets by mouth every 4 (four) hours as needed for severe pain., Disp: 30 tablet, Rfl: 0 .  oxyCODONE-acetaminophen (PERCOCET) 5-325 MG tablet, Take 1-2 tablets by mouth every 4 (four) hours as needed for severe pain., Disp: 30 tablet, Rfl: 0 .  oxyCODONE-acetaminophen (PERCOCET) 5-325 MG tablet, Take 1-2 tablets by mouth every 4 (four) hours as needed for severe pain., Disp: 30 tablet, Rfl: 0 .  paliperidone (INVEGA) 6 MG 24 hr tablet, Take 12 mg by mouth daily., Disp: , Rfl:  .  prazosin (MINIPRESS) 2 MG capsule, Take 2-4 mg by mouth at bedtime as needed (for sleep). , Disp: , Rfl:  .  ranolazine (RANEXA) 500 MG 12 hr tablet, Take 1 tablet (500 mg total) by mouth 2 (two) times daily., Disp: 180 tablet, Rfl: 3 .  risperiDONE (RISPERDAL) 3 MG tablet, Take 3 mg by mouth at bedtime., Disp: , Rfl:   Social History   Tobacco Use  Smoking Status Current Every Day Smoker  . Packs/day: 0.50  . Years: 30.00  . Pack years: 15.00  . Types: Cigarettes  Smokeless Tobacco Never Used  Tobacco Comment   3 cigs per day    Allergies  Allergen Reactions  . Aspirin Swelling and Other (See Comments)    Facial swelling    Objective:  There were no vitals filed for this visit. There is no height or weight on file to calculate BMI. Constitutional Well developed. Well nourished.  Vascular Foot warm and well perfused. Capillary refill normal to all digits.   Neurologic Normal speech. Oriented to person, place, and time. Epicritic sensation to light touch grossly present bilaterally.  Dermatologic  skin completely epithelialized.  Mild pain on palpation to the incision site.  No tonic plantar fascia noted.  Hammertoe contracture noted bilaterally 2 through 5.  Prominence noted of the contracture.  Orthopedic: Tenderness to palpation noted about the surgical site.   Radiographs: None Assessment:   1. Plantar fasciitis of left  foot   2. Status post foot surgery   3. Hammertoe, bilateral   4. Diabetic peripheral neuropathy associated with type 2 diabetes mellitus ([place])    Plan:  Patient was evaluated and treated and all questions answered.  Hammertoe contracture bilateral 2 through 5 -I explained to patient the etiology of hammertoe contracture and various treatment options were discussed.  Given that patient also has underlying uncontrolled diabetes I believe patient  will benefit from diabetic shoes to help offload contracture side therefore preventing future ulceration.  Patient agrees with the plan like to obtain diabetic shoes. -Prescription for diabetic shoes were given.  S/p foot surgery left -Progressing as expected post-operatively. -XR: None -WB Status: Weightbearing as tolerated in regular shoes -Sutures: None -Medications: None -He has obtained over-the-counter insoles.  He has started functioning with him.  He states that helps him a little bit but still there is tenderness associated with it.  No follow-ups on file.

## 2020-10-28 NOTE — Telephone Encounter (Signed)
I have paperwork, will bring down

## 2020-10-28 NOTE — Telephone Encounter (Signed)
[facility] has requested prior authorization for the following patient. They are requesting additional information as well. They sent information to the wrong fax number I did however give them correct fax and once its sent I'll put it in your box, Please Advise

## 2020-11-02 ENCOUNTER — Other Ambulatory Visit: Payer: Self-pay | Admitting: Podiatry

## 2020-11-02 ENCOUNTER — Telehealth: Payer: Self-pay | Admitting: Podiatry

## 2020-11-02 MED ORDER — OXYCODONE-ACETAMINOPHEN 5-325 MG PO TABS: 1.0000 | ORAL_TABLET | ORAL | 0 refills | Status: DC | PRN

## 2020-11-02 NOTE — Telephone Encounter (Signed)
Pt called to get his last refill on his pain meds. He is also waiting for paperwork to be completed or his diabetic shoes. Please advise

## 2020-11-09 ENCOUNTER — Other Ambulatory Visit: Payer: Self-pay | Admitting: Podiatry

## 2020-11-09 ENCOUNTER — Telehealth: Payer: Self-pay | Admitting: Podiatry

## 2020-11-09 MED ORDER — OXYCODONE-ACETAMINOPHEN 5-325 MG PO TABS: 1.0000 | ORAL_TABLET | ORAL | 0 refills | Status: DC | PRN

## 2020-11-09 NOTE — Telephone Encounter (Signed)
We sent them all the information. Ammie sent it in.   Thank you

## 2020-11-09 NOTE — Telephone Encounter (Signed)
sent 

## 2020-11-09 NOTE — Telephone Encounter (Signed)
Great.  Thanks

## 2020-11-09 NOTE — Telephone Encounter (Signed)
Thank you, will follow up with patient. Do you have any information regarding his shoes? [place] told him they were still waiting on your approval for is diabetic shoes.

## 2020-11-09 NOTE — Telephone Encounter (Signed)
Patient calling to inform Dr. Posey Pronto that [facility] is still waiting on approval for patient to get diabetic shoes. Patient is also requesting a refill on pain medication (still in pain) until he can be fitted for diabetic shoes.

## 2020-11-16 ENCOUNTER — Telehealth: Payer: Self-pay | Admitting: Podiatry

## 2020-11-16 ENCOUNTER — Other Ambulatory Visit: Payer: Self-pay | Admitting: "Endocrinology

## 2020-11-16 NOTE — Telephone Encounter (Signed)
Pt called stating he is still having pain in his foot and needs a refill on his pain medication until he can get his diabetic shoes. Pts insurance does not cover thru our office and pt did go to get fitted but has not heard from where he went. This is a Dr Posey Pronto pt.

## 2020-11-16 NOTE — Telephone Encounter (Signed)
Dr. Posey Pronto- is it OK for me to refill this medication for him? Looks like you just did it last week and has been on chronic pain medication. Does he need a referral to pain management?

## 2020-11-16 NOTE — Telephone Encounter (Signed)
Lets hold off on refilling his pain medication. I have already spoken to him briefly about pain management. He wants to think about it. I have told him that we cannot prescribe long term pain medication

## 2020-11-18 ENCOUNTER — Other Ambulatory Visit: Payer: Self-pay | Admitting: Diagnostic Neuroimaging

## 2020-11-30 ENCOUNTER — Encounter (HOSPITAL_COMMUNITY): Payer: Self-pay | Admitting: *Deleted

## 2020-11-30 ENCOUNTER — Emergency Department (HOSPITAL_COMMUNITY)
Admission: EM | Admit: 2020-11-30 | Discharge: 2020-11-30 | Disposition: A | Discharge status: Home / Self Care | Payer: Medicaid Other | Attending: Emergency Medicine | Admitting: Emergency Medicine

## 2020-11-30 ENCOUNTER — Other Ambulatory Visit: Payer: Self-pay

## 2020-11-30 ENCOUNTER — Emergency Department (HOSPITAL_COMMUNITY): Payer: Medicaid Other

## 2020-11-30 DIAGNOSIS — W010XXA Fall on same level from slipping, tripping and stumbling without subsequent striking against object, initial encounter: Secondary | ICD-10-CM | POA: Insufficient documentation

## 2020-11-30 DIAGNOSIS — I129 Hypertensive chronic kidney disease with stage 1 through stage 4 chronic kidney disease, or unspecified chronic kidney disease: Secondary | ICD-10-CM | POA: Insufficient documentation

## 2020-11-30 DIAGNOSIS — Z79899 Other long term (current) drug therapy: Secondary | ICD-10-CM | POA: Diagnosis not present

## 2020-11-30 DIAGNOSIS — W19XXXA Unspecified fall, initial encounter: Secondary | ICD-10-CM

## 2020-11-30 DIAGNOSIS — N183 Chronic kidney disease, stage 3 unspecified: Secondary | ICD-10-CM | POA: Insufficient documentation

## 2020-11-30 DIAGNOSIS — F17210 Nicotine dependence, cigarettes, uncomplicated: Secondary | ICD-10-CM | POA: Diagnosis not present

## 2020-11-30 DIAGNOSIS — E1122 Type 2 diabetes mellitus with diabetic chronic kidney disease: Secondary | ICD-10-CM | POA: Diagnosis not present

## 2020-11-30 DIAGNOSIS — I2510 Atherosclerotic heart disease of native coronary artery without angina pectoris: Secondary | ICD-10-CM | POA: Insufficient documentation

## 2020-11-30 DIAGNOSIS — M533 Sacrococcygeal disorders, not elsewhere classified: Secondary | ICD-10-CM | POA: Diagnosis not present

## 2020-11-30 DIAGNOSIS — Z794 Long term (current) use of insulin: Secondary | ICD-10-CM | POA: Insufficient documentation

## 2020-11-30 DIAGNOSIS — Z951 Presence of aortocoronary bypass graft: Secondary | ICD-10-CM | POA: Diagnosis not present

## 2020-11-30 MED ORDER — KETOROLAC TROMETHAMINE 30 MG/ML IJ SOLN
30.0000 mg | Freq: Once | INTRAMUSCULAR | Status: AC
  Administered 2020-11-30: 30 mg via INTRAMUSCULAR
  Filled 2020-11-30: qty 1

## 2020-11-30 NOTE — Discharge Instructions (Addendum)
     You were evaluated in the Emergency Department and after careful evaluation, we did not find any emergent condition requiring admission or further testing in the hospital.   Your exam/testing today was overall reassuring.  Symptoms seem to be due to your fall.  Please continue take Tylenol or ibuprofen as directed on bottle for pain.  Her blood pressure is also elevated today in the emergency department, this is most likely due to pain however I wanted to get this reevaluated with your PCP in the next couple of days. Please return to the Emergency Department if you experience any worsening of your condition.  Thank you for allowing [place] to be a part of your care. Please speak to your pharmacist about any new medications prescribed today in regards to side effects or interactions with other medications.

## 2020-11-30 NOTE — ED Notes (Signed)
Medication hold until 1645.

## 2020-11-30 NOTE — ED Provider Notes (Addendum)
[facility] EMERGENCY DEPARTMENT Provider Note   CSN: [id number] Arrival date & time: 11/30/20  1244     History Chief Complaint  Patient presents with  . Tailbone Pain    Melvin Taylor is a 55 y.o. male with pertient past medical history of bipolar 1 disorder, chronic back pain, anxiety, hypertension, neuropathy, schizophrenia that presents the emergency department today for tailbone pain after fall.  He states that he was fishing with his father yesterday, started healing and then he ran to the car, slipped and fell on his tailbone.  Patient states that he is currently having tailbone pain, does not radiate anywhere.  No pain down his leg.  No prior injury to this area.  Did not hurt himself elsewhere.  Did not injure his head.  Was in his normal health before this.  Patient states that the pain is severe, has been able to walk normally since then.  Has been taking Tylenol without much relief.  Per chart review patient has been asking for pain medication from rece, was referred to go see a pain clinic.  HPI     Past Medical History:  Diagnosis Date  . Anxiety   . Bipolar 1 disorder ([place])   . Bone spur    left heel  . Cervical radiculopathy   . Chest pain   . Chronic back pain   . Chronic chest wall pain   . Chronic neck pain   . Coronary artery disease    a. s/p CABG in 03/2016 with LIMA-LAD, SVG-D1, SVG-RCA, and Seq SVG-mid and distal OM  . Depression   . Diabetes mellitus    Type II  . Hallucinations    "long history of them"  . Headache(784.0)   . History of gout   . HTN (hypertension)   . Insomnia   . Myocardial infarction ([place]) 2017  . Neuropathy   . Pain management   . Polysubstance abuse ([place])   . Right leg pain    chronic  . Schizophrenia ([place])   . Seizures ([place])    last sz between July 5-9th, 2016; epilepsy  . Shortness of breath dyspnea    with exertion  . Sleep apnea   . Transfusion of blood product refused for religious reason     Patient  Active Problem List   Diagnosis Date Noted  . Current smoker 06/21/2019  . Seizure-like activity ([place]) 05/06/2019  . Type 2 diabetes mellitus with stage 3 chronic kidney disease, with long-term current use of insulin ([place]) 04/11/2019  . Anemia, chronic disease 07/04/2018  . Other specified diseases of the digestive system 10/30/2017  . CAD (coronary artery disease) 03/28/2016  . Chest pain 03/21/2016  . Mixed hyperlipidemia 03/11/2016  . Ischemic chest pain ([place])   . NSTEMI (non-ST elevated myocardial infarction) ([place])   . Coronary atherosclerosis of native coronary artery 03/03/2016  . Cervical spinal stenosis 12/14/2015  . Loss of weight 08/25/2015  . Essential hypertension, benign 07/23/2015  . Personal history of noncompliance with medical treatment, presenting hazards to health 07/23/2015  . Abdominal pain 06/08/2015  . Constipation 06/08/2015  . History of colonic polyps   . Diverticulosis of colon without hemorrhage   . Mucosal abnormality of stomach   . Encounter for screening colonoscopy 04/16/2015  . Dysphagia 04/16/2015  . Partial symptomatic epilepsy with complex partial seizures, intractable, without status epilepticus ([place]) 11/10/2014  . Bipolar disorder, unspecified ([place]) 10/15/2014  . Schizophrenia ([place]) 10/15/2014  . DM type 2 causing vascular  disease ([place]) 06/29/2014  . Musculoskeletal pain 06/29/2014  . Hyperglycemia without ketosis 09/07/2013  . 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 Other internal derangements of unspecified knee 01/13/2011  . Knee pain 12/28/2010  . Medial meniscus, posterior horn derangement 12/28/2010    Past Surgical History:  Procedure Laterality Date  . ANTERIOR CERVICAL DECOMP/DISCECTOMY FUSION N/A 12/14/2015   Procedure: ANTERIOR CERVICAL DECOMPRESSION/DISCECTOMY FUSION CERVICAL FIVE -SIX;  Surgeon: Earnie Larsson, MD;  Location: [place] [facility];  Service: Neurosurgery;  Laterality: N/A;  . BIOPSY  05/07/2015   Procedure:  BIOPSY (Gastric);  Surgeon: [name], MD;  Location: [facility];  Service: Endoscopy;;  . CARDIAC CATHETERIZATION N/A 03/07/2016   Procedure: Left Heart Cath and Coronary Angiography;  Surgeon: Belva Crome, MD;  Location: [place];  Service: Cardiovascular;  Laterality: N/A;  . COLONOSCOPY WITH PROPOFOL N/A 05/07/2015   [id number] diverticulosis, multiple colon polyps removed, tubular adenoma, serrated colon polyp. Next colonoscopy October 2019  . COLONOSCOPY WITH PROPOFOL N/A 08/02/2018   Procedure: COLONOSCOPY WITH PROPOFOL;  Surgeon: [name], MD;  Location: [facility];  Service: Endoscopy;  Laterality: N/A;  12:00pm  . CORONARY ARTERY BYPASS GRAFT N/A 03/28/2016   Procedure: CORONARY ARTERY BYPASS GRAFTING (CABG) x 5 USING GREATER SAPHENOUS VEIN;  Surgeon: [name], MD;  Location: [place] [facility] OR;  Service: Open Heart Surgery;  Laterality: N/A;  . ENDOVEIN HARVEST OF GREATER SAPHENOUS VEIN Right 03/28/2016   Procedure: ENDOVEIN HARVEST OF GREATER SAPHENOUS VEIN;  Surgeon: [name], MD;  Location: [facility];  Service: Open Heart Surgery;  Laterality: Right;  . ESOPHAGEAL DILATION N/A 05/07/2015   Procedure: ESOPHAGEAL DILATION WITH 56FR MALONEY DILATOR;  Surgeon: [name], MD;  Location: [facility];  Service: Endoscopy;  Laterality: N/A;  . ESOPHAGOGASTRODUODENOSCOPY (EGD) WITH PROPOFOL N/A 05/07/2015   RMR: Status post dilation of normal esophagus. Gastritis.  Marland Kitchen KNEE SURGERY Left    arthroscopy  . MANDIBLE FRACTURE SURGERY    . POLYPECTOMY  05/07/2015   Procedure: POLYPECTOMY (Hepatic Flexure, Distal Transverse Colon, Rectal);  Surgeon: [name], MD;  Location: [facility];  Service: Endoscopy;;  . TEE WITHOUT CARDIOVERSION N/A 03/28/2016   Procedure: TRANSESOPHAGEAL ECHOCARDIOGRAM (TEE);  Surgeon: [name], MD;  Location: [facility];  Service: Open Heart Surgery;  Laterality: N/A;  . TONSILLECTOMY         Family History  Problem Relation Age of Onset  . Diabetes  Father   . Hypertension Father   . Arthritis Other   . Diabetes Other   . Asthma Other   . Stroke Sister     Social History   Tobacco Use  . Smoking status: Current Every Day Smoker    Packs/day: 0.50    Years: 30.00    Pack years: 15.00    Types: Cigarettes  . Smokeless tobacco: Never Used  . Tobacco comment: 3 cigs per day  Vaping Use  . Vaping Use: Never used  Substance Use Topics  . Alcohol use: No  . Drug use: Yes    Types: Marijuana    Home Medications Prior to Admission medications   Medication Sig Start Date End Date Taking? Authorizing Provider  albuterol (PROVENTIL HFA;VENTOLIN HFA) 108 (90 Base) MCG/ACT inhaler Inhale 2 puffs into the lungs every 4 (four) hours as needed for wheezing or shortness of breath.     [provider]  amLODipine (NORVASC) 10 MG tablet Take 1 tablet (10 mg total) by  mouth daily. 04/14/20   Verta Ellen., NP  atorvastatin (LIPITOR) 40 MG tablet Take 40 mg by mouth daily. Take 1 tablet at bedtime.    [provider]  baclofen (LIORESAL) 10 MG tablet Take 10 mg by mouth 2 (two) times daily. 02/18/20   [provider]  clopidogrel (PLAVIX) 75 MG tablet Take 75 mg by mouth daily.    [provider]  diazepam (VALIUM) 5 MG tablet Take 5 mg by mouth 2 (two) times daily as needed for anxiety.     [provider]  divalproex (DEPAKOTE) 500 MG DR tablet Take 3 tablets (1,500 mg total) by mouth 2 (two) times daily. 06/02/20   Penumalli, Earlean Polka, MD  escitalopram (LEXAPRO) 20 MG tablet Take 20 mg by mouth daily.     [provider]  glucose blood (ACCU-CHEK GUIDE) test strip Use to check glucose 4 times a day 09/09/20   Cassandria Anger, MD  ibuprofen (ADVIL) 800 MG tablet Take 800 mg by mouth every 6 (six) hours as needed. 11/17/20   [provider]  insulin aspart (NOVOLOG) 100 UNIT/ML FlexPen Inject 4-10 Units into the skin 3 (three) times daily with meals. 09/09/20   Cassandria Anger, MD  insulin glargine (LANTUS SOLOSTAR) 100 UNIT/ML Solostar Pen Inject 40 Units into the skin at bedtime. 11/16/20   Cassandria Anger, MD  isosorbide mononitrate (IMDUR) 30 MG 24 hr tablet Take 1 tablet (30 mg total) by mouth daily. 09/14/20   Hayden Rasmussen, MD  lamoTRIgine (LAMICTAL) 25 MG tablet Take 50 mg by mouth 2 (two) times daily.  05/20/19   [provider]  lisinopril (ZESTRIL) 20 MG tablet TAKE ONE TABLET BY MOUTH ONCE DAILY. 10/12/20   Strader, Fransisco Hertz, PA-C  Melatonin 10 MG CAPS Take 1 capsule by mouth at bedtime as needed (for sleep).  06/12/19   [provider]  nitroGLYCERIN (NITROSTAT) 0.4 MG SL tablet Place 1 tablet (0.4 mg total) under the tongue every 5 (five) minutes as needed for chest pain. 01/15/20 04/14/20  Herminio Commons, MD  oxyCODONE-acetaminophen (PERCOCET) 10-325 MG tablet Take 1 tablet by mouth every 6 (six) hours as needed for pain. 09/21/20   Trula Slade, DPM  oxyCODONE-acetaminophen (PERCOCET) 5-325 MG tablet Take 1-2 tablets by mouth every 4 (four) hours as needed for severe pain. 09/29/20   [name], DPM  oxyCODONE-acetaminophen (PERCOCET) 5-325 MG tablet Take 1-2 tablets by mouth every 4 (four) hours as needed for severe pain. 10/12/20   [name], DPM  oxyCODONE-acetaminophen (PERCOCET) 5-325 MG tablet Take 1-2 tablets by mouth every 4 (four) hours as needed for severe pain. 10/17/20   [name], DPM  oxyCODONE-acetaminophen (PERCOCET) 5-325 MG tablet Take 1-2 tablets by mouth every 4 (four) hours as needed for severe pain. 10/22/20   [name], DPM  oxyCODONE-acetaminophen (PERCOCET) 5-325 MG tablet Take 1-2 tablets by mouth every 4 (four) hours as needed for severe pain. 10/27/20   [name], DPM  oxyCODONE-acetaminophen (PERCOCET) 5-325 MG tablet Take 1-2 tablets by mouth every 4 (four) hours as needed for severe pain. 11/02/20   [name], DPM  oxyCODONE-acetaminophen (PERCOCET) 5-325  MG tablet Take 1-2 tablets by mouth every 4 (four) hours as needed for severe pain. 11/09/20   [name], DPM  paliperidone (INVEGA) 6 MG 24 hr tablet Take 12 mg by mouth daily.  01/19/21  [provider]  prazosin (MINIPRESS)  2 MG capsule Take 2-4 mg by mouth at bedtime as needed (for sleep).  12/03/19   [provider]  ranolazine (RANEXA) 500 MG 12 hr tablet Take 1 tablet (500 mg total) by mouth 2 (two) times daily. 10/09/20   Strader, Fransisco Hertz, PA-C  risperiDONE (RISPERDAL) 3 MG tablet Take 3 mg by mouth at bedtime. 08/16/19   [provider]    Allergies    Aspirin  Review of Systems   Review of Systems  Constitutional: Negative for diaphoresis, fatigue and fever.  Eyes: Negative for visual disturbance.  Respiratory: Negative for shortness of breath.   Cardiovascular: Negative for chest pain.  Gastrointestinal: Negative for nausea and vomiting.  Musculoskeletal: Positive for arthralgias (tailbone pain). Negative for back pain and myalgias.  Skin: Negative for color change, pallor, rash and wound.  Neurological: Negative for syncope, weakness, light-headedness, numbness and headaches.  Psychiatric/Behavioral: Negative for behavioral problems and confusion.    Physical Exam Updated Vital Signs BP (!) 167/105   Pulse 67   Temp 98.6 F (37 C) (Oral)   Resp 15   SpO2 97%   Physical Exam Constitutional:      General: He is not in acute distress.    Appearance: Normal appearance. He is not ill-appearing, toxic-appearing or diaphoretic.  HENT:     Head: Normocephalic and atraumatic.     Nose: Nose normal. No congestion or rhinorrhea.     Mouth/Throat:     Mouth: Mucous membranes are moist.  Eyes:     Extraocular Movements: Extraocular movements intact.     Pupils: Pupils are equal, round, and reactive to light.  Cardiovascular:     Rate and Rhythm: Normal rate and regular rhythm.     Pulses: Normal pulses.  Pulmonary:     Effort: Pulmonary  effort is normal.     Breath sounds: Normal breath sounds.  Musculoskeletal:        General: Normal range of motion.     Cervical back: Normal range of motion.       Back:     Comments: Patient was midline tenderness to sacrum inspected above.  No step-offs or crepitus felt.  No erythema, no objective numbness.  Patient is ambulatory.  Skin:    General: Skin is warm and dry.     Capillary Refill: Capillary refill takes less than 2 seconds.  Neurological:     General: No focal deficit present.     Mental Status: He is alert and oriented to person, place, and time.  Psychiatric:        Mood and Affect: Mood normal.        Behavior: Behavior normal.        Thought Content: Thought content normal.     ED Results / Procedures / Treatments   Labs (all labs ordered are listed, but only abnormal results are displayed) Labs Reviewed - No data to display  EKG None  Radiology No results found.  Procedures Procedures   Medications Ordered in ED Medications - No data to display  ED Course  I have reviewed the triage vital signs and the nursing notes.  Pertinent labs & imaging results that were available during my care of the patient were reviewed by me and considered in my medical decision making (see chart for details).    MDM Rules/Calculators/A&P                          Melvin Ka  Taylor is a 55 y.o. male with friend past medical history of bipolar 1 disorder, chronic back pain, anxiety, hypertension, neuropathy, schizophrenia that presents the emergency department today for tailbone pain after fall.  No red flag symptoms.  Physical exam without any bony tenderness or crepitus.   Exam does not reveal any evidence of pilonidal cyst, cellulitis.  Plain films show no evidence of acute fracture or dislocation.  Will give Toradol, patient without any evidence of of chronic kidney disease.  Patient states that he takes ibuprofen at home.  Last creatinine 1.24.  GFR over 60.  Patient  is ambulatory, to be discharged with PCP follow-up.  Doubt need for further emergent work up at this time. I explained the diagnosis and have given explicit precautions to return to the ER including for any other new or worsening symptoms. The patient understands and accepts the medical plan as it's been dictated and I have answered their questions. Discharge instructions concerning home care and prescriptions have been given. The patient is STABLE and is discharged to home in good condition.   Final Clinical Impression(s) / ED Diagnoses Final diagnoses:  Fall, initial encounter    Rx / DC Orders ED Discharge Orders    None        Alfredia Client, PA-C 11/30/20 1613    Margette Fast, MD 12/01/20 1054

## 2020-11-30 NOTE — ED Triage Notes (Signed)
Pain in tailbone area onset yesterday

## 2020-12-09 ENCOUNTER — Other Ambulatory Visit: Payer: Self-pay

## 2020-12-09 ENCOUNTER — Ambulatory Visit (INDEPENDENT_AMBULATORY_CARE_PROVIDER_SITE_OTHER): Payer: Medicaid Other | Admitting: Podiatry

## 2020-12-09 DIAGNOSIS — M722 Plantar fascial fibromatosis: Secondary | ICD-10-CM | POA: Diagnosis not present

## 2020-12-09 MED ORDER — OXYCODONE-ACETAMINOPHEN 10-325 MG PO TABS: 1.0000 | ORAL_TABLET | ORAL | 0 refills | Status: DC | PRN

## 2020-12-10 ENCOUNTER — Other Ambulatory Visit: Payer: Self-pay | Admitting: "Endocrinology

## 2020-12-10 ENCOUNTER — Encounter: Payer: Self-pay | Admitting: Podiatry

## 2020-12-10 NOTE — Progress Notes (Signed)
Subjective:  Patient ID: Melvin Taylor, male    DOB: May 28, 1966,  MRN: [id number]  Chief Complaint  Patient presents with  . Plantar Fasciitis    PT stated that he is doing a little better      55 y.o. male returns for post-op check.  He is doing well.  He denies any other acute complaints.  He still has tenderness and pain to it.  He has been ambulating in regular shoes.  He has been taking his pain medication.  Review of Systems: Negative except as noted in the HPI. Denies N/V/F/Ch.  Past Medical History:  Diagnosis Date  . Anxiety   . Bipolar 1 disorder ([place])   . Bone spur    left heel  . Cervical radiculopathy   . Chest pain   . Chronic back pain   . Chronic chest wall pain   . Chronic neck pain   . Coronary artery disease    a. s/p CABG in 03/2016 with LIMA-LAD, SVG-D1, SVG-RCA, and Seq SVG-mid and distal OM  . Depression   . Diabetes mellitus    Type II  . Hallucinations    "long history of them"  . Headache(784.0)   . History of gout   . HTN (hypertension)   . Insomnia   . Myocardial infarction ([place]) 2017  . Neuropathy   . Pain management   . Polysubstance abuse ([place])   . Right leg pain    chronic  . Schizophrenia ([place])   . Seizures ([place])    last sz between July 5-9th, 2016; epilepsy  . Shortness of breath dyspnea    with exertion  . Sleep apnea   . Transfusion of blood product refused for religious reason     Current Outpatient Medications:  .  oxyCODONE-acetaminophen (PERCOCET) 10-325 MG tablet, Take 1 tablet by mouth every 4 (four) hours as needed for pain., Disp: 30 tablet, Rfl: 0 .  albuterol (PROVENTIL HFA;VENTOLIN HFA) 108 (90 Base) MCG/ACT inhaler, Inhale 2 puffs into the lungs every 4 (four) hours as needed for wheezing or shortness of breath. , Disp: , Rfl:  .  amLODipine (NORVASC) 10 MG tablet, Take 1 tablet (10 mg total) by mouth daily., Disp: 30 tablet, Rfl: 3 .  atorvastatin (LIPITOR) 40 MG tablet, Take 40 mg by mouth daily. Take 1  tablet at bedtime., Disp: , Rfl:  .  baclofen (LIORESAL) 10 MG tablet, Take 10 mg by mouth 2 (two) times daily., Disp: , Rfl:  .  clopidogrel (PLAVIX) 75 MG tablet, Take 75 mg by mouth daily., Disp: , Rfl:  .  diazepam (VALIUM) 5 MG tablet, Take 5 mg by mouth 2 (two) times daily as needed for anxiety. , Disp: , Rfl:  .  divalproex (DEPAKOTE) 500 MG DR tablet, Take 3 tablets (1,500 mg total) by mouth 2 (two) times daily., Disp: 540 tablet, Rfl: 4 .  escitalopram (LEXAPRO) 20 MG tablet, Take 20 mg by mouth daily. , Disp: , Rfl:  .  glucose blood (ACCU-CHEK GUIDE) test strip, Use to check glucose 4 times a day, Disp: 150 strip, Rfl: 2 .  ibuprofen (ADVIL) 800 MG tablet, Take 800 mg by mouth every 6 (six) hours as needed., Disp: , Rfl:  .  insulin aspart (NOVOLOG) 100 UNIT/ML FlexPen, Inject 4-10 Units into the skin 3 (three) times daily with meals., Disp: 15 mL, Rfl: 1 .  insulin glargine (LANTUS SOLOSTAR) 100 UNIT/ML Solostar Pen, Inject 40 Units into the skin at bedtime., Disp:  30 mL, Rfl: 0 .  isosorbide mononitrate (IMDUR) 30 MG 24 hr tablet, Take 1 tablet (30 mg total) by mouth daily., Disp: 30 tablet, Rfl: 1 .  lamoTRIgine (LAMICTAL) 25 MG tablet, Take 50 mg by mouth 2 (two) times daily. , Disp: , Rfl:  .  lisinopril (ZESTRIL) 20 MG tablet, TAKE ONE TABLET BY MOUTH ONCE DAILY., Disp: 90 tablet, Rfl: 3 .  Melatonin 10 MG CAPS, Take 1 capsule by mouth at bedtime as needed (for sleep). , Disp: , Rfl:  .  nitroGLYCERIN (NITROSTAT) 0.4 MG SL tablet, Place 1 tablet (0.4 mg total) under the tongue every 5 (five) minutes as needed for chest pain., Disp: 25 tablet, Rfl: 3 .  oxyCODONE-acetaminophen (PERCOCET) 10-325 MG tablet, Take 1 tablet by mouth every 6 (six) hours as needed for pain., Disp: 15 tablet, Rfl: 0 .  oxyCODONE-acetaminophen (PERCOCET) 5-325 MG tablet, Take 1-2 tablets by mouth every 4 (four) hours as needed for severe pain., Disp: 30 tablet, Rfl: 0 .  oxyCODONE-acetaminophen (PERCOCET)  5-325 MG tablet, Take 1-2 tablets by mouth every 4 (four) hours as needed for severe pain., Disp: 30 tablet, Rfl: 0 .  oxyCODONE-acetaminophen (PERCOCET) 5-325 MG tablet, Take 1-2 tablets by mouth every 4 (four) hours as needed for severe pain., Disp: 30 tablet, Rfl: 0 .  oxyCODONE-acetaminophen (PERCOCET) 5-325 MG tablet, Take 1-2 tablets by mouth every 4 (four) hours as needed for severe pain., Disp: 30 tablet, Rfl: 0 .  oxyCODONE-acetaminophen (PERCOCET) 5-325 MG tablet, Take 1-2 tablets by mouth every 4 (four) hours as needed for severe pain., Disp: 30 tablet, Rfl: 0 .  oxyCODONE-acetaminophen (PERCOCET) 5-325 MG tablet, Take 1-2 tablets by mouth every 4 (four) hours as needed for severe pain., Disp: 30 tablet, Rfl: 0 .  oxyCODONE-acetaminophen (PERCOCET) 5-325 MG tablet, Take 1-2 tablets by mouth every 4 (four) hours as needed for severe pain., Disp: 30 tablet, Rfl: 0 .  paliperidone (INVEGA) 6 MG 24 hr tablet, Take 12 mg by mouth daily., Disp: , Rfl:  .  prazosin (MINIPRESS) 2 MG capsule, Take 2-4 mg by mouth at bedtime as needed (for sleep). , Disp: , Rfl:  .  ranolazine (RANEXA) 500 MG 12 hr tablet, Take 1 tablet (500 mg total) by mouth 2 (two) times daily., Disp: 180 tablet, Rfl: 3 .  risperiDONE (RISPERDAL) 3 MG tablet, Take 3 mg by mouth at bedtime., Disp: , Rfl:   Social History   Tobacco Use  Smoking Status Current Every Day Smoker  . Packs/day: 0.50  . Years: 30.00  . Pack years: 15.00  . Types: Cigarettes  Smokeless Tobacco Never Used  Tobacco Comment   3 cigs per day    Allergies  Allergen Reactions  . Aspirin Swelling and Other (See Comments)    Facial swelling    Objective:  There were no vitals filed for this visit. There is no height or weight on file to calculate BMI. Constitutional Well developed. Well nourished.  Vascular Foot warm and well perfused. Capillary refill normal to all digits.   Neurologic Normal speech. Oriented to person, place, and  time. Epicritic sensation to light touch grossly present bilaterally.  Dermatologic  skin completely epithelialized.  Mild pain on palpation to the incision site.  No tonic plantar fascia noted.  Hammertoe contracture noted bilaterally 2 through 5.  Prominence noted of the contracture.  Orthopedic: Tenderness to palpation noted about the surgical site.   Radiographs: None Assessment:   No diagnosis found. Plan:  Patient  was evaluated and treated and all questions answered.  Hammertoe contracture bilateral 2 through 5 -I explained to patient the etiology of hammertoe contracture and various treatment options were discussed.  Given that patient also has underlying uncontrolled diabetes I believe patient will benefit from diabetic shoes to help offload contracture side therefore preventing future ulceration.  Patient agrees with the plan like to obtain diabetic shoes. -Prescription for diabetic shoes were given.  S/p foot surgery left Clinically healing well.  He still only has a little bit of residual pain.  Treatment today will be the last time I will be refilling his pain medication.  He states understanding.  Overall his surgical foot has healed completely.  No follow-ups on file.

## 2020-12-12 ENCOUNTER — Encounter (HOSPITAL_COMMUNITY): Payer: Self-pay

## 2020-12-12 ENCOUNTER — Other Ambulatory Visit: Payer: Self-pay

## 2020-12-12 ENCOUNTER — Emergency Department (HOSPITAL_COMMUNITY)
Admission: EM | Admit: 2020-12-12 | Discharge: 2020-12-12 | Disposition: A | Discharge status: Home / Self Care | Payer: Medicaid Other | Attending: Emergency Medicine | Admitting: Emergency Medicine

## 2020-12-12 ENCOUNTER — Emergency Department (HOSPITAL_COMMUNITY): Payer: Medicaid Other

## 2020-12-12 DIAGNOSIS — Z794 Long term (current) use of insulin: Secondary | ICD-10-CM | POA: Insufficient documentation

## 2020-12-12 DIAGNOSIS — E1122 Type 2 diabetes mellitus with diabetic chronic kidney disease: Secondary | ICD-10-CM | POA: Insufficient documentation

## 2020-12-12 DIAGNOSIS — F17210 Nicotine dependence, cigarettes, uncomplicated: Secondary | ICD-10-CM | POA: Diagnosis not present

## 2020-12-12 DIAGNOSIS — N183 Chronic kidney disease, stage 3 unspecified: Secondary | ICD-10-CM | POA: Insufficient documentation

## 2020-12-12 DIAGNOSIS — R519 Headache, unspecified: Secondary | ICD-10-CM | POA: Diagnosis present

## 2020-12-12 DIAGNOSIS — Z7902 Long term (current) use of antithrombotics/antiplatelets: Secondary | ICD-10-CM | POA: Insufficient documentation

## 2020-12-12 DIAGNOSIS — R509 Fever, unspecified: Secondary | ICD-10-CM | POA: Insufficient documentation

## 2020-12-12 DIAGNOSIS — D631 Anemia in chronic kidney disease: Secondary | ICD-10-CM | POA: Insufficient documentation

## 2020-12-12 DIAGNOSIS — E1165 Type 2 diabetes mellitus with hyperglycemia: Secondary | ICD-10-CM | POA: Insufficient documentation

## 2020-12-12 DIAGNOSIS — Z951 Presence of aortocoronary bypass graft: Secondary | ICD-10-CM | POA: Diagnosis not present

## 2020-12-12 DIAGNOSIS — I129 Hypertensive chronic kidney disease with stage 1 through stage 4 chronic kidney disease, or unspecified chronic kidney disease: Secondary | ICD-10-CM | POA: Insufficient documentation

## 2020-12-12 DIAGNOSIS — I2510 Atherosclerotic heart disease of native coronary artery without angina pectoris: Secondary | ICD-10-CM | POA: Insufficient documentation

## 2020-12-12 DIAGNOSIS — Z79899 Other long term (current) drug therapy: Secondary | ICD-10-CM | POA: Insufficient documentation

## 2020-12-12 DIAGNOSIS — E1140 Type 2 diabetes mellitus with diabetic neuropathy, unspecified: Secondary | ICD-10-CM | POA: Diagnosis not present

## 2020-12-12 LAB — CBC WITH DIFFERENTIAL/PLATELET
Abs Immature Granulocytes: 0.01 10*3/uL (ref 0.00–0.07)
Basophils Absolute: 0 10*3/uL (ref 0.0–0.1)
Basophils Relative: 1 %
Eosinophils Absolute: 0.1 10*3/uL (ref 0.0–0.5)
Eosinophils Relative: 2 %
HCT: 38.4 % — ABNORMAL LOW (ref 39.0–52.0)
Hemoglobin: 13.2 g/dL (ref 13.0–17.0)
Immature Granulocytes: 0 %
Lymphocytes Relative: 44 %
Lymphs Abs: 2.3 10*3/uL (ref 0.7–4.0)
MCH: 31.5 pg (ref 26.0–34.0)
MCHC: 34.4 g/dL (ref 30.0–36.0)
MCV: 91.6 fL (ref 80.0–100.0)
Monocytes Absolute: 0.5 10*3/uL (ref 0.1–1.0)
Monocytes Relative: 9 %
Neutro Abs: 2.3 10*3/uL (ref 1.7–7.7)
Neutrophils Relative %: 44 %
Platelets: 200 10*3/uL (ref 150–400)
RBC: 4.19 MIL/uL — ABNORMAL LOW (ref 4.22–5.81)
RDW: 12.3 % (ref 11.5–15.5)
WBC: 5.2 10*3/uL (ref 4.0–10.5)
nRBC: 0 % (ref 0.0–0.2)

## 2020-12-12 LAB — COMPREHENSIVE METABOLIC PANEL
ALT: 13 U/L (ref 0–44)
AST: 15 U/L (ref 15–41)
Albumin: 2.7 g/dL — ABNORMAL LOW (ref 3.5–5.0)
Alkaline Phosphatase: 42 U/L (ref 38–126)
Anion gap: 5 (ref 5–15)
BUN: 17 mg/dL (ref 6–20)
CO2: 29 mmol/L (ref 22–32)
Calcium: 8.7 mg/dL — ABNORMAL LOW (ref 8.9–10.3)
Chloride: 104 mmol/L (ref 98–111)
Creatinine, Ser: 1.43 mg/dL — ABNORMAL HIGH (ref 0.61–1.24)
GFR, Estimated: 58 mL/min — ABNORMAL LOW (ref >60)
Glucose, Bld: 134 mg/dL — ABNORMAL HIGH (ref 70–99)
Potassium: 5 mmol/L (ref 3.5–5.1)
Sodium: 138 mmol/L (ref 135–145)
Total Bilirubin: 0.8 mg/dL (ref 0.3–1.2)
Total Protein: 5.1 g/dL — ABNORMAL LOW (ref 6.5–8.1)

## 2020-12-12 LAB — TROPONIN I (HIGH SENSITIVITY)
Troponin I (High Sensitivity): 4 ng/L (ref <18)
Troponin I (High Sensitivity): 5 ng/L (ref <18)

## 2020-12-12 MED ORDER — ONDANSETRON HCL 4 MG/2ML IJ SOLN
4.0000 mg | Freq: Once | INTRAMUSCULAR | Status: AC
  Administered 2020-12-12: 4 mg via INTRAVENOUS
  Filled 2020-12-12: qty 2

## 2020-12-12 MED ORDER — METOCLOPRAMIDE HCL 5 MG/ML IJ SOLN
10.0000 mg | Freq: Once | INTRAMUSCULAR | Status: AC
  Administered 2020-12-12: 10 mg via INTRAVENOUS
  Filled 2020-12-12: qty 2

## 2020-12-12 MED ORDER — SODIUM CHLORIDE 0.9 % IV BOLUS
1000.0000 mL | Freq: Once | INTRAVENOUS | Status: AC
  Administered 2020-12-12: 1000 mL via INTRAVENOUS

## 2020-12-12 MED ORDER — AMLODIPINE BESYLATE 5 MG PO TABS
10.0000 mg | ORAL_TABLET | Freq: Once | ORAL | Status: AC
  Administered 2020-12-12: 10 mg via ORAL
  Filled 2020-12-12: qty 2

## 2020-12-12 NOTE — Discharge Instructions (Addendum)
Your exam and vital signs look reassuring.  I suspect you just had a migraine.  I recommend over-the-counter pain medications please take as needed.  Please go home and take your blood pressure medications as your blood pressure is high today.  I have given you your first dose of Norvasc please take your lisinopril.  Please follow-up with your PCP as needed.  Come back to the emergency department if you develop chest pain, shortness of breath, severe abdominal pain, uncontrolled nausea, vomiting, diarrhea.

## 2020-12-12 NOTE — ED Triage Notes (Signed)
Patient complaining of headache and overall not feeling well since yesterday. States that he has not taken BP meds in one day. Reports generalized pain and believes he may have been running fever at home.

## 2020-12-12 NOTE — ED Provider Notes (Signed)
[facility] EMERGENCY DEPARTMENT Provider Note   CSN: [id number] Arrival date & time: 12/12/20  1007     History Chief Complaint  Patient presents with  . Headache    STEPEHEN BUSSIE is a 55 y.o. male.  HPI   Patient with significant medical history of bipolar, CKD CABG 2017, type 2 diabetes seizure disorder presents with chief complaint of not feeling well.  Patient endorses 2 days ago he fell and hit the back of his head, states his legs gave out causing him to fall, since then he has not been feeling right, endorses headaches, fevers, chills, and general body aches.  He denies change in vision, paresthesias or weakness the upper or lower extremities, he is not on anticoagulant, denies IV drug use, denies  neck pain.  Patient denies recent sick contacts, is not up-to-date on his COVID or his influenza shots, states that he has been taken his medications, denies recent change in his seizure medications.  Patient denies chest pain, shortness of breath, abdominal pain, nausea, vomit, diarrhea, or some pedal edema.  Past Medical History:  Diagnosis Date  . Anxiety   . Bipolar 1 disorder ([place])   . Bone spur    left heel  . Cervical radiculopathy   . Chest pain   . Chronic back pain   . Chronic chest wall pain   . Chronic neck pain   . Coronary artery disease    a. s/p CABG in 03/2016 with LIMA-LAD, SVG-D1, SVG-RCA, and Seq SVG-mid and distal OM  . Depression   . Diabetes mellitus    Type II  . Hallucinations    "long history of them"  . Headache(784.0)   . History of gout   . HTN (hypertension)   . Insomnia   . Myocardial infarction ([place]) 2017  . Neuropathy   . Pain management   . Polysubstance abuse ([place])   . Right leg pain    chronic  . Schizophrenia ([place])   . Seizures ([place])    last sz between July 5-9th, 2016; epilepsy  . Shortness of breath dyspnea    with exertion  . Sleep apnea   . Transfusion of blood product refused for religious reason     Patient  Active Problem List   Diagnosis Date Noted  . Current smoker 06/21/2019  . Seizure-like activity ([place]) 05/06/2019  . Type 2 diabetes mellitus with stage 3 chronic kidney disease, with long-term current use of insulin ([place]) 04/11/2019  . Anemia, chronic disease 07/04/2018  . Other specified diseases of the digestive system 10/30/2017  . CAD (coronary artery disease) 03/28/2016  . Chest pain 03/21/2016  . Mixed hyperlipidemia 03/11/2016  . Ischemic chest pain ([place])   . NSTEMI (non-ST elevated myocardial infarction) ([employer])   . Coronary atherosclerosis of native coronary artery 03/03/2016  . Cervical spinal stenosis 12/14/2015  . Loss of weight 08/25/2015  . Essential hypertension, benign 07/23/2015  . Personal history of noncompliance with medical treatment, presenting hazards to health 07/23/2015  . Abdominal pain 06/08/2015  . Constipation 06/08/2015  . History of colonic polyps   . Diverticulosis of colon without hemorrhage   . Mucosal abnormality of stomach   . Encounter for screening colonoscopy 04/16/2015  . Dysphagia 04/16/2015  . Partial symptomatic epilepsy with complex partial seizures, intractable, without status epilepticus ([place]) 11/10/2014  . Bipolar disorder, unspecified ([place]) 10/15/2014  . Schizophrenia ([place]) 10/15/2014  . DM type 2 causing vascular disease ([place]) 06/29/2014  . Musculoskeletal pain 06/29/2014  .  Hyperglycemia without ketosis 09/07/2013  . 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 Other internal derangements of unspecified knee 01/13/2011  . Knee pain 12/28/2010  . Medial meniscus, posterior horn derangement 12/28/2010    Past Surgical History:  Procedure Laterality Date  . ANTERIOR CERVICAL DECOMP/DISCECTOMY FUSION N/A 12/14/2015   Procedure: ANTERIOR CERVICAL DECOMPRESSION/DISCECTOMY FUSION CERVICAL FIVE -SIX;  Surgeon: Earnie Larsson, MD;  Location: [facility]UR[facility] ORS;  Service: Neurosurgery;  Laterality: N/A;  . BIOPSY  05/07/2015   Procedure:  BIOPSY (Gastric);  Surgeon: [name], MD;  Location: [facility] OR[facility];  Service: Endoscopy;;  . CARDIAC CATHETERIZATION N/A 03/07/2016   Procedure: Left Heart Cath and Coronary Angiography;  Surgeon: Belva Crome, MD;  Location: [facility] CV LAB;  Service: Cardiovascular;  Laterality: N/A;  . COLONOSCOPY WITH PROPOFOL N/A 05/07/2015   LKG:[id number] diverticulosis, multiple colon polyps removed, tubular adenoma, serrated colon polyp. Next colonoscopy October 2019  . COLONOSCOPY WITH PROPOFOL N/A 08/02/2018   Procedure: COLONOSCOPY WITH PROPOFOL;  Surgeon: [name], MD;  Location: [facility];  Service: Endoscopy;  Laterality: N/A;  12:00pm  . CORONARY ARTERY BYPASS GRAFT N/A 03/28/2016   Procedure: CORONARY ARTERY BYPASS GRAFTING (CABG) x 5 USING GREATER SAPHENOUS VEIN;  Surgeon: [name], MD;  Location: [facility] OR;  Service: Open Heart Surgery;  Laterality: N/A;  . ENDOVEIN HARVEST OF GREATER SAPHENOUS VEIN Right 03/28/2016   Procedure: ENDOVEIN HARVEST OF GREATER SAPHENOUS VEIN;  Surgeon: [name], MD;  Location: [facility];  Service: Open Heart Surgery;  Laterality: Right;  . ESOPHAGEAL DILATION N/A 05/07/2015   Procedure: ESOPHAGEAL DILATION WITH 56FR MALONEY DILATOR;  Surgeon: [name], MD;  Location: [facility];  Service: Endoscopy;  Laterality: N/A;  . ESOPHAGOGASTRODUODENOSCOPY (EGD) WITH PROPOFOL N/A 05/07/2015   RMR: Status post dilation of normal esophagus. Gastritis.  Marland Kitchen KNEE SURGERY Left    arthroscopy  . MANDIBLE FRACTURE SURGERY    . POLYPECTOMY  05/07/2015   Procedure: POLYPECTOMY (Hepatic Flexure, Distal Transverse Colon, Rectal);  Surgeon: [name], MD;  Location: [facility];  Service: Endoscopy;;  . TEE WITHOUT CARDIOVERSION N/A 03/28/2016   Procedure: TRANSESOPHAGEAL ECHOCARDIOGRAM (TEE);  Surgeon: [name], MD;  Location: [facility];  Service: Open Heart Surgery;  Laterality: N/A;  . TONSILLECTOMY         Family History  Problem Relation Age of Onset  . Diabetes  Father   . Hypertension Father   . Arthritis Other   . Diabetes Other   . Asthma Other   . Stroke Sister     Social History   Tobacco Use  . Smoking status: Current Every Day Smoker    Packs/day: 0.50    Years: 30.00    Pack years: 15.00    Types: Cigarettes  . Smokeless tobacco: Never Used  . Tobacco comment: 3 cigs per day  Vaping Use  . Vaping Use: Never used  Substance Use Topics  . Alcohol use: No  . Drug use: Yes    Types: Marijuana    Home Medications Prior to Admission medications   Medication Sig Start Date End Date Taking? Authorizing Provider  albuterol (PROVENTIL HFA;VENTOLIN HFA) 108 (90 Base) MCG/ACT inhaler Inhale 2 puffs into the lungs every 4 (four) hours as needed for wheezing or shortness of breath.    Yes [provider]  amLODipine (NORVASC) 10 MG tablet Take 1 tablet (10 mg total) by mouth daily. 04/14/20  Yes Verta Ellen., NP  atorvastatin (LIPITOR) 40 MG tablet Take 40 mg by mouth daily. Take 1 tablet at bedtime.   Yes [provider]  baclofen (LIORESAL) 10 MG tablet Take 10 mg by mouth 2 (two) times daily. 02/18/20  Yes [provider]  clopidogrel (PLAVIX) 75 MG tablet Take 75 mg by mouth daily.   Yes [provider]  diazepam (VALIUM) 5 MG tablet Take 5 mg by mouth 2 (two) times daily as needed for anxiety.    Yes [provider]  divalproex (DEPAKOTE) 500 MG DR tablet Take 3 tablets (1,500 mg total) by mouth 2 (two) times daily. 06/02/20  Yes Penumalli, Vikram R, MD  escitalopram (LEXAPRO) 20 MG tablet Take 20 mg by mouth daily.    Yes [provider]  ibuprofen (ADVIL) 800 MG tablet Take 800 mg by mouth every 6 (six) hours as needed for moderate pain. 11/17/20  Yes [provider]  insulin aspart (NOVOLOG) 100 UNIT/ML FlexPen Inject 4-10 Units into the skin 3 (three) times daily with meals. 09/09/20  Yes Nida, Marella Chimes, MD  insulin glargine (LANTUS SOLOSTAR) 100 UNIT/ML Solostar  Pen Inject 40 Units into the skin at bedtime. 11/16/20  Yes Cassandria Anger, MD  isosorbide mononitrate (IMDUR) 30 MG 24 hr tablet Take 1 tablet (30 mg total) by mouth daily. 09/14/20  Yes Hayden Rasmussen, MD  lamoTRIgine (LAMICTAL) 25 MG tablet Take 50 mg by mouth 2 (two) times daily.  05/20/19  Yes [provider]  lisinopril (ZESTRIL) 20 MG tablet TAKE ONE TABLET BY MOUTH ONCE DAILY. Patient taking differently: Take 20 mg by mouth daily. 10/12/20  Yes Strader, Fransisco Hertz, PA-C  Melatonin 10 MG CAPS Take 1 capsule by mouth at bedtime as needed (for sleep).  06/12/19  Yes [provider]  nitroGLYCERIN (NITROSTAT) 0.4 MG SL tablet Place 1 tablet (0.4 mg total) under the tongue every 5 (five) minutes as needed for chest pain. 01/15/20 04/14/20 Yes Herminio Commons, MD  oxyCODONE-acetaminophen (PERCOCET) 5-325 MG tablet Take 1-2 tablets by mouth every 4 (four) hours as needed for severe pain. 11/09/20  Yes [name], DPM  paliperidone (INVEGA) 6 MG 24 hr tablet Take 12 mg by mouth daily.  01/19/21 Yes [provider]  prazosin (MINIPRESS) 2 MG capsule Take 2-4 mg by mouth at bedtime as needed (for sleep).  12/03/19  Yes [provider]  ranolazine (RANEXA) 500 MG 12 hr tablet Take 1 tablet (500 mg total) by mouth 2 (two) times daily. 10/09/20  Yes Strader, [place] M, PA-C  risperiDONE (RISPERDAL) 3 MG tablet Take 3 mg by mouth at bedtime. 08/16/19  Yes [provider]    Allergies    Aspirin  Review of Systems   Review of Systems  Constitutional: Positive for chills and fever.  HENT: Negative for congestion and sore throat.   Respiratory: Negative for shortness of breath.   Cardiovascular: Negative for chest pain.  Gastrointestinal: Negative for abdominal pain, diarrhea, nausea and vomiting.  Genitourinary: Negative for enuresis and flank pain.  Musculoskeletal: Positive for myalgias. Negative for back pain.  Skin: Negative for rash.   Neurological: Positive for headaches.  Hematological: Does not bruise/bleed easily.    Physical Exam Updated Vital Signs BP (!) 175/95   Pulse (!) 52   Temp 98.7 F (37.1 C) (Oral)   Resp 16   Ht 5\' 7"  (1.702 m)   Wt 68 kg   SpO2 100%   BMI 23.48 kg/m   Physical  Exam Vitals and nursing note reviewed.  Constitutional:      General: He is not in acute distress.    Appearance: He is not ill-appearing.  HENT:     Head: Normocephalic and atraumatic.     Comments: Head was palpated nontender to palpation, no deformities present.  No raccoon eyes or battle sign    Nose: No congestion.     Mouth/Throat:     Mouth: Mucous membranes are moist.     Pharynx: Oropharynx is clear.  Eyes:     Extraocular Movements: Extraocular movements intact.     Conjunctiva/sclera: Conjunctivae normal.     Pupils: Pupils are equal, round, and reactive to light.  Cardiovascular:     Rate and Rhythm: Normal rate and regular rhythm.     Pulses: Normal pulses.     Heart sounds: No murmur heard. No friction rub. No gallop.   Pulmonary:     Effort: No respiratory distress.     Breath sounds: No wheezing, rhonchi or rales.  Abdominal:     Palpations: Abdomen is soft.     Tenderness: There is no abdominal tenderness.  Musculoskeletal:     Cervical back: No rigidity or tenderness.     Comments: Patient has 5-5 strength, neurovascular intact in the upper lower extremities  Spine was palpated is nontender to palpation, no step-off deformities present.  Skin:    General: Skin is warm and dry.  Neurological:     Mental Status: He is alert.     GCS: GCS eye subscore is 4. GCS verbal subscore is 5. GCS motor subscore is 6.     Motor: No weakness.     Coordination: Romberg sign negative. Finger-Nose-Finger Test normal.     Comments: Current nerves II through XII are grossly intact  Patient have no difficulty with word finding.  Psychiatric:        Mood and Affect: Mood normal.     ED Results /  Procedures / Treatments   Labs (all labs ordered are listed, but only abnormal results are displayed) Labs Reviewed  COMPREHENSIVE METABOLIC PANEL - Abnormal; Notable for the following components:      Result Value   Glucose, Bld 134 (*)    Creatinine, Ser 1.43 (*)    Calcium 8.7 (*)    Total Protein 5.1 (*)    Albumin 2.7 (*)    GFR, Estimated 58 (*)    All other components within normal limits  CBC WITH DIFFERENTIAL/PLATELET - Abnormal; Notable for the following components:   RBC 4.19 (*)    HCT 38.4 (*)    All other components within normal limits  URINALYSIS, ROUTINE W REFLEX MICROSCOPIC  RAPID URINE DRUG SCREEN, HOSP PERFORMED  TROPONIN I (HIGH SENSITIVITY)  TROPONIN I (HIGH SENSITIVITY)    EKG EKG Interpretation  Date/Time:  Saturday Dec 12 2020 10:18:14 EDT Ventricular Rate:  63 PR Interval:  175 QRS Duration: 79 QT Interval:  412 QTC Calculation: 422 R Axis:   -64 Text Interpretation: Sinus rhythm Probable left atrial enlargement Abnormal R-wave progression, early transition Inferior infarct, old Consider anterior infarct Confirmed by Milton Ferguson [phone]) on 12/12/2020 1:37:59 PM   Radiology CT Head Wo Contrast  Result Date: 12/12/2020 CLINICAL DATA:  55 year old with acute onset of headache and possible fever that began yesterday. Current history of hypertension. EXAM: CT HEAD WITHOUT CONTRAST TECHNIQUE: Contiguous axial images were obtained from the base of the skull through the vertex without intravenous contrast. COMPARISON:  06/09/2018 and  earlier. FINDINGS: Brain: Ventricular system normal in size and appearance for age. No mass lesion. No midline shift. No acute hemorrhage or hematoma. No extra-axial fluid collections. No evidence of acute infarction. No interval change. Vascular: Mild BILATERAL carotid siphon atherosclerosis. No hyperdense vessel. Skull: No skull fracture or other focal osseous abnormality involving the skull. Sinuses/Orbits: Visualized  paranasal sinuses, bilateral mastoid air cells and bilateral middle ear cavities well-aerated. Visualized orbits and globes normal in appearance. Other: None. IMPRESSION: No acute intracranial abnormality.  Stable examination. Electronically Signed   By: Evangeline Dakin M.D.   On: 12/12/2020 11:57   DG Chest Port 1 View  Result Date: 12/12/2020 CLINICAL DATA:  Patient complaining of headache and overall not feeling well since yesterday. States that he has not taken BP meds in one day. Reports generalized pain and believes he may have been running fever at home. EXAM: PORTABLE CHEST - 1 VIEW COMPARISON:  09/14/2020 FINDINGS: Lungs are clear.  Previous CABG. Heart size and mediastinal contours are within normal limits. Aortic Atherosclerosis (ICD10-170.0). No effusion.  No pneumothorax. Visualized bones unremarkable. IMPRESSION: No acute cardiopulmonary disease post CABG. Electronically Signed   By: Lucrezia Europe M.D.   On: 12/12/2020 11:48    Procedures Procedures   Medications Ordered in ED Medications  metoCLOPramide (REGLAN) injection 10 mg (10 mg Intravenous Given 12/12/20 1149)  ondansetron (ZOFRAN) injection 4 mg (4 mg Intravenous Given 12/12/20 1149)  sodium chloride 0.9 % bolus 1,000 mL (0 mLs Intravenous Stopped 12/12/20 1345)  amLODipine (NORVASC) tablet 10 mg (10 mg Oral Given 12/12/20 1151)    ED Course  I have reviewed the triage vital signs and the nursing notes.  Pertinent labs & imaging results that were available during my care of the patient were reviewed by me and considered in my medical decision making (see chart for details).    MDM Rules/Calculators/A&P                          Initial impression-patient presents with not feeling well.  He is alert, does not appear in acute distress, vital signs notable for hypertension.  Endorses that he is not taking any of his blood pressure medications, will obtain basic lab work-up, CT head, provide patient with blood pressure  medication and reassess  Work-up-CBC unremarkable, CMP shows hyperglycemia 134, creatinine 1.43 appears to be baseline for patient, negative delta troponin.  CT head negative for acute findings, chest x-ray negative for acute findings.  EKG sinus without signs of ischemia no ST elevation depression noted.  Reassessment patient was reassessed after fluids, migraine cocktail, states he feels much better, and would like to go home, he has no complaints at this time.  Vital signs are notable for hypertension suspect this is secondary due to missed blood pressure medications, will take them when he gets home.  Rule out-low suspicion for intracranial head bleed and/or mass as there is no neurodeficits present my exam, CT head is negative for acute findings.  Low suspicion for systemic infection as patient is nontoxic-appearing, no source infection found on exam, no meningeal sign present.  Low suspicion for ACS or arrhythmias as patient has chest pain, shortness of breath, EKG without signs of ischemia, patient had negative delta troponin.  Low suspicion for hypertensive urgency or emergency as there is no signs of organ damage, suspect elevated blood pressure secondary due to missed dosages will have him take this when he gets home.  Plan-  1.  Headache since resolved-suspect secondary due to a migraine versus elevated blood pressure, will have patient continue over-the-counter pain medications, take his at home blood pressure medications follow-up PCP for further evaluation.  Vital signs have remained stable, no indication for hospital admission.    Patient given at home care as well strict return precautions.  Patient verbalized that they understood agreed to said plan.   Final Clinical Impression(s) / ED Diagnoses Final diagnoses:  Bad headache    Rx / DC Orders ED Discharge Orders    None       Einer, Meals, PA-C 12/12/20 1429    Milton Ferguson, MD 12/12/20 1453

## 2020-12-12 NOTE — ED Notes (Signed)
Patient to CT at this time

## 2020-12-15 ENCOUNTER — Other Ambulatory Visit: Payer: Self-pay

## 2020-12-15 ENCOUNTER — Emergency Department (HOSPITAL_COMMUNITY)
Admission: EM | Admit: 2020-12-15 | Discharge: 2020-12-15 | Disposition: A | Discharge status: Home / Self Care | Payer: Medicaid Other | Source: Home / Self Care | Attending: Emergency Medicine | Admitting: Emergency Medicine

## 2020-12-15 ENCOUNTER — Encounter (HOSPITAL_COMMUNITY): Payer: Self-pay | Admitting: *Deleted

## 2020-12-15 DIAGNOSIS — R519 Headache, unspecified: Secondary | ICD-10-CM | POA: Insufficient documentation

## 2020-12-15 DIAGNOSIS — E1151 Type 2 diabetes mellitus with diabetic peripheral angiopathy without gangrene: Secondary | ICD-10-CM | POA: Insufficient documentation

## 2020-12-15 DIAGNOSIS — Z794 Long term (current) use of insulin: Secondary | ICD-10-CM | POA: Insufficient documentation

## 2020-12-15 DIAGNOSIS — Z79899 Other long term (current) drug therapy: Secondary | ICD-10-CM | POA: Insufficient documentation

## 2020-12-15 DIAGNOSIS — Z951 Presence of aortocoronary bypass graft: Secondary | ICD-10-CM | POA: Insufficient documentation

## 2020-12-15 DIAGNOSIS — R569 Unspecified convulsions: Secondary | ICD-10-CM

## 2020-12-15 DIAGNOSIS — E1140 Type 2 diabetes mellitus with diabetic neuropathy, unspecified: Secondary | ICD-10-CM | POA: Insufficient documentation

## 2020-12-15 DIAGNOSIS — I2510 Atherosclerotic heart disease of native coronary artery without angina pectoris: Secondary | ICD-10-CM | POA: Insufficient documentation

## 2020-12-15 DIAGNOSIS — I129 Hypertensive chronic kidney disease with stage 1 through stage 4 chronic kidney disease, or unspecified chronic kidney disease: Secondary | ICD-10-CM | POA: Insufficient documentation

## 2020-12-15 DIAGNOSIS — N183 Chronic kidney disease, stage 3 unspecified: Secondary | ICD-10-CM | POA: Insufficient documentation

## 2020-12-15 DIAGNOSIS — E1169 Type 2 diabetes mellitus with other specified complication: Secondary | ICD-10-CM | POA: Insufficient documentation

## 2020-12-15 DIAGNOSIS — R630 Anorexia: Secondary | ICD-10-CM | POA: Insufficient documentation

## 2020-12-15 DIAGNOSIS — E782 Mixed hyperlipidemia: Secondary | ICD-10-CM | POA: Insufficient documentation

## 2020-12-15 DIAGNOSIS — R531 Weakness: Secondary | ICD-10-CM | POA: Insufficient documentation

## 2020-12-15 DIAGNOSIS — Z7902 Long term (current) use of antithrombotics/antiplatelets: Secondary | ICD-10-CM | POA: Insufficient documentation

## 2020-12-15 DIAGNOSIS — F17210 Nicotine dependence, cigarettes, uncomplicated: Secondary | ICD-10-CM | POA: Insufficient documentation

## 2020-12-15 DIAGNOSIS — E1122 Type 2 diabetes mellitus with diabetic chronic kidney disease: Secondary | ICD-10-CM | POA: Insufficient documentation

## 2020-12-15 LAB — MAGNESIUM: Magnesium: 1.7 mg/dL (ref 1.7–2.4)

## 2020-12-15 LAB — CBC WITH DIFFERENTIAL/PLATELET
Abs Immature Granulocytes: 0 10*3/uL (ref 0.00–0.07)
Basophils Absolute: 0 10*3/uL (ref 0.0–0.1)
Basophils Relative: 1 %
Eosinophils Absolute: 0.1 10*3/uL (ref 0.0–0.5)
Eosinophils Relative: 2 %
HCT: 37.3 % — ABNORMAL LOW (ref 39.0–52.0)
Hemoglobin: 12.8 g/dL — ABNORMAL LOW (ref 13.0–17.0)
Immature Granulocytes: 0 %
Lymphocytes Relative: 59 %
Lymphs Abs: 2.2 10*3/uL (ref 0.7–4.0)
MCH: 31.1 pg (ref 26.0–34.0)
MCHC: 34.3 g/dL (ref 30.0–36.0)
MCV: 90.5 fL (ref 80.0–100.0)
Monocytes Absolute: 0.3 10*3/uL (ref 0.1–1.0)
Monocytes Relative: 8 %
Neutro Abs: 1.2 10*3/uL — ABNORMAL LOW (ref 1.7–7.7)
Neutrophils Relative %: 30 %
Platelets: 213 10*3/uL (ref 150–400)
RBC: 4.12 MIL/uL — ABNORMAL LOW (ref 4.22–5.81)
RDW: 12.3 % (ref 11.5–15.5)
WBC: 3.8 10*3/uL — ABNORMAL LOW (ref 4.0–10.5)
nRBC: 0 % (ref 0.0–0.2)

## 2020-12-15 LAB — RAPID URINE DRUG SCREEN, HOSP PERFORMED
Amphetamines: NOT DETECTED
Barbiturates: NOT DETECTED
Benzodiazepines: NOT DETECTED
Cocaine: NOT DETECTED
Opiates: NOT DETECTED
Tetrahydrocannabinol: POSITIVE — AB

## 2020-12-15 LAB — COMPREHENSIVE METABOLIC PANEL
ALT: 13 U/L (ref 0–44)
AST: 20 U/L (ref 15–41)
Albumin: 2.8 g/dL — ABNORMAL LOW (ref 3.5–5.0)
Alkaline Phosphatase: 41 U/L (ref 38–126)
Anion gap: 6 (ref 5–15)
BUN: 14 mg/dL (ref 6–20)
CO2: 28 mmol/L (ref 22–32)
Calcium: 8.2 mg/dL — ABNORMAL LOW (ref 8.9–10.3)
Chloride: 103 mmol/L (ref 98–111)
Creatinine, Ser: 1.33 mg/dL — ABNORMAL HIGH (ref 0.61–1.24)
GFR, Estimated: >60 mL/min (ref >60)
Glucose, Bld: 157 mg/dL — ABNORMAL HIGH (ref 70–99)
Potassium: 4.6 mmol/L (ref 3.5–5.1)
Sodium: 137 mmol/L (ref 135–145)
Total Bilirubin: 0.7 mg/dL (ref 0.3–1.2)
Total Protein: 5.5 g/dL — ABNORMAL LOW (ref 6.5–8.1)

## 2020-12-15 LAB — ETHANOL: Alcohol, Ethyl (B): <10 mg/dL (ref <10)

## 2020-12-15 LAB — VALPROIC ACID LEVEL: Valproic Acid Lvl: 69 ug/mL (ref 50.0–100.0)

## 2020-12-15 MED ORDER — SODIUM CHLORIDE 0.9 % IV BOLUS
1000.0000 mL | Freq: Once | INTRAVENOUS | Status: AC
  Administered 2020-12-15: 1000 mL via INTRAVENOUS

## 2020-12-15 MED ORDER — DIPHENHYDRAMINE HCL 50 MG/ML IJ SOLN
12.5000 mg | Freq: Once | INTRAMUSCULAR | Status: AC
  Administered 2020-12-15: 12.5 mg via INTRAVENOUS
  Filled 2020-12-15: qty 1

## 2020-12-15 MED ORDER — VALPROATE SODIUM 100 MG/ML IV SOLN
500.0000 mg | Freq: Once | INTRAVENOUS | Status: AC
  Administered 2020-12-15: 500 mg via INTRAVENOUS
  Filled 2020-12-15: qty 5

## 2020-12-15 MED ORDER — PROCHLORPERAZINE EDISYLATE 10 MG/2ML IJ SOLN
10.0000 mg | Freq: Once | INTRAMUSCULAR | Status: AC
  Administered 2020-12-15: 10 mg via INTRAVENOUS
  Filled 2020-12-15: qty 2

## 2020-12-15 NOTE — ED Provider Notes (Addendum)
[facility] EMERGENCY DEPARTMENT Provider Note   CSN: [id number] Arrival date & time: 12/15/20  9798     History Chief Complaint  Patient presents with  . Seizures    Melvin Taylor is a 55 y.o. male with PMH of type II DM, bipolar disorder, HTN, HLD, CAD with MI on Plavix, and seizure disorder on Depakote who presents to the ED via EMS after reported seizure.  History is obtained by EMS reports that patient was not postictal on their arrival.  Initial CBG 159.  His blood pressure was however elevated at 200/109.  I reviewed patient's medical record and he was seen yesterday in the ED for headache symptoms.  CT head was obtained without contrast which demonstrated no acute intracranial normality.  Patient was nontoxic-appearing without meningeal signs.  Given reassuring work-up, patient was ultimately discharged home.  On my examination, patient reports that his headache had improved after last ED visit, but he never truly felt better.  Yesterday he simply felt weak and "weird".  This morning he woke up on the bedroom floor.  He suspects that he had a seizure earlier this morning.  When he finally came to, he called his father who advised him to come to the ED for evaluation.  He complains predominantly of a headache, states that this is typical when he has a seizure.  He states that he had a seizure before his ER encounter yesterday, but his most recent previously had been in 2016.  He had not seen his neurologist in few years because it had been well controlled.  In addition to his generalized weakness and feeling "weird", he also is endorsing diminished appetite.    He denies any blurred vision, diplopia, numbness, chest pain, shortness of breath, abdominal pain, urinary symptoms, changes in his bowel habits.  Patient is simply asking for something for his headache.  Patient smokes less than 1 pack/day.  He denies any alcohol use or other illicit drug use.  HPI     Past Medical  History:  Diagnosis Date  . Anxiety   . Bipolar 1 disorder ([place])   . Bone spur    left heel  . Cervical radiculopathy   . Chest pain   . Chronic back pain   . Chronic chest wall pain   . Chronic neck pain   . Coronary artery disease    a. s/p CABG in 03/2016 with LIMA-LAD, SVG-D1, SVG-RCA, and Seq SVG-mid and distal OM  . Depression   . Diabetes mellitus    Type II  . Hallucinations    "long history of them"  . Headache(784.0)   . History of gout   . HTN (hypertension)   . Insomnia   . Myocardial infarction ([place]) 2017  . Neuropathy   . Pain management   . Polysubstance abuse ([place])   . Right leg pain    chronic  . Schizophrenia ([place])   . Seizures ([place])    last sz between July 5-9th, 2016; epilepsy  . Shortness of breath dyspnea    with exertion  . Sleep apnea   . Transfusion of blood product refused for religious reason     Patient Active Problem List   Diagnosis Date Noted  . Current smoker 06/21/2019  . Seizure-like activity ([place]) 05/06/2019  . Type 2 diabetes mellitus with stage 3 chronic kidney disease, with long-term current use of insulin ([place]) 04/11/2019  . Anemia, chronic disease 07/04/2018  . Other specified diseases of the digestive system  10/30/2017  . CAD (coronary artery disease) 03/28/2016  . Chest pain 03/21/2016  . Mixed hyperlipidemia 03/11/2016  . Ischemic chest pain ([place])   . NSTEMI (non-ST elevated myocardial infarction) ([place])   . Coronary atherosclerosis of native coronary artery 03/03/2016  . Cervical spinal stenosis 12/14/2015  . Loss of weight 08/25/2015  . Essential hypertension, benign 07/23/2015  . Personal history of noncompliance with medical treatment, presenting hazards to health 07/23/2015  . Abdominal pain 06/08/2015  . Constipation 06/08/2015  . History of colonic polyps   . Diverticulosis of colon without hemorrhage   . Mucosal abnormality of stomach   . Encounter for screening colonoscopy 04/16/2015  . Dysphagia 04/16/2015   . Partial symptomatic epilepsy with complex partial seizures, intractable, without status epilepticus ([place]) 11/10/2014  . Bipolar disorder, unspecified ([place]) 10/15/2014  . Schizophrenia ([place]) 10/15/2014  . DM type 2 causing vascular disease ([place]) 06/29/2014  . Musculoskeletal pain 06/29/2014  . Hyperglycemia without ketosis 09/07/2013  . 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 Other internal derangements of unspecified knee 01/13/2011  . Knee pain 12/28/2010  . Medial meniscus, posterior horn derangement 12/28/2010    Past Surgical History:  Procedure Laterality Date  . ANTERIOR CERVICAL DECOMP/DISCECTOMY FUSION N/A 12/14/2015   Procedure: ANTERIOR CERVICAL DECOMPRESSION/DISCECTOMY FUSION CERVICAL FIVE -SIX;  Surgeon: Earnie Larsson, MD;  Location: [place] [facility] ORS;  Service: Neurosurgery;  Laterality: N/A;  . BIOPSY  05/07/2015   Procedure: BIOPSY (Gastric);  Surgeon: [name], MD;  Location: [facility];  Service: Endoscopy;;  . CARDIAC CATHETERIZATION N/A 03/07/2016   Procedure: Left Heart Cath and Coronary Angiography;  Surgeon: Belva Crome, MD;  Location: [place] CV LAB;  Service: Cardiovascular;  Laterality: N/A;  . COLONOSCOPY WITH PROPOFOL N/A 05/07/2015   [id number] diverticulosis, multiple colon polyps removed, tubular adenoma, serrated colon polyp. Next colonoscopy October 2019  . COLONOSCOPY WITH PROPOFOL N/A 08/02/2018   Procedure: COLONOSCOPY WITH PROPOFOL;  Surgeon: [name], MD;  Location: [facility];  Service: Endoscopy;  Laterality: N/A;  12:00pm  . CORONARY ARTERY BYPASS GRAFT N/A 03/28/2016   Procedure: CORONARY ARTERY BYPASS GRAFTING (CABG) x 5 USING GREATER SAPHENOUS VEIN;  Surgeon: [name], MD;  Location: [facility] OR;  Service: Open Heart Surgery;  Laterality: N/A;  . ENDOVEIN HARVEST OF GREATER SAPHENOUS VEIN Right 03/28/2016   Procedure: ENDOVEIN HARVEST OF GREATER SAPHENOUS VEIN;  Surgeon: [name], MD;  Location: [facility];  Service: Open  Heart Surgery;  Laterality: Right;  . ESOPHAGEAL DILATION N/A 05/07/2015   Procedure: ESOPHAGEAL DILATION WITH 56FR MALONEY DILATOR;  Surgeon: [name], MD;  Location: [facility];  Service: Endoscopy;  Laterality: N/A;  . ESOPHAGOGASTRODUODENOSCOPY (EGD) WITH PROPOFOL N/A 05/07/2015   RMR: Status post dilation of normal esophagus. Gastritis.  Marland Kitchen KNEE SURGERY Left    arthroscopy  . MANDIBLE FRACTURE SURGERY    . POLYPECTOMY  05/07/2015   Procedure: POLYPECTOMY (Hepatic Flexure, Distal Transverse Colon, Rectal);  Surgeon: [name], MD;  Location: [facility];  Service: Endoscopy;;  . TEE WITHOUT CARDIOVERSION N/A 03/28/2016   Procedure: TRANSESOPHAGEAL ECHOCARDIOGRAM (TEE);  Surgeon: [name], MD;  Location: [facility];  Service: Open Heart Surgery;  Laterality: N/A;  . TONSILLECTOMY         Family History  Problem Relation Age of Onset  . Diabetes Father   . Hypertension Father   . Arthritis Other   . Diabetes Other   . Asthma Other   .  Stroke Sister     Social History   Tobacco Use  . Smoking status: Current Every Day Smoker    Packs/day: 0.50    Years: 30.00    Pack years: 15.00    Types: Cigarettes  . Smokeless tobacco: Never Used  . Tobacco comment: 3 cigs per day  Vaping Use  . Vaping Use: Never used  Substance Use Topics  . Alcohol use: No  . Drug use: Yes    Types: Marijuana    Home Medications Prior to Admission medications   Medication Sig Start Date End Date Taking? Authorizing Provider  albuterol (PROVENTIL HFA;VENTOLIN HFA) 108 (90 Base) MCG/ACT inhaler Inhale 2 puffs into the lungs every 4 (four) hours as needed for wheezing or shortness of breath.    Yes [provider]  amLODipine (NORVASC) 10 MG tablet Take 1 tablet (10 mg total) by mouth daily. 04/14/20  Yes Verta Ellen., NP  atorvastatin (LIPITOR) 40 MG tablet Take 40 mg by mouth daily. Take 1 tablet at bedtime.   Yes [provider]  baclofen (LIORESAL) 10 MG tablet Take 10  mg by mouth 2 (two) times daily. 02/18/20  Yes [provider]  clopidogrel (PLAVIX) 75 MG tablet Take 75 mg by mouth daily.   Yes [provider]  diazepam (VALIUM) 5 MG tablet Take 5 mg by mouth 2 (two) times daily as needed for anxiety.    Yes [provider]  divalproex (DEPAKOTE) 500 MG DR tablet Take 3 tablets (1,500 mg total) by mouth 2 (two) times daily. 06/02/20  Yes Penumalli, Vikram R, MD  escitalopram (LEXAPRO) 20 MG tablet Take 20 mg by mouth daily.    Yes [provider]  ibuprofen (ADVIL) 800 MG tablet Take 800 mg by mouth every 6 (six) hours as needed for moderate pain. 11/17/20  Yes [provider]  insulin aspart (NOVOLOG) 100 UNIT/ML FlexPen Inject 4-10 Units into the skin 3 (three) times daily with meals. 09/09/20  Yes Nida, Marella Chimes, MD  insulin glargine (LANTUS SOLOSTAR) 100 UNIT/ML Solostar Pen Inject 40 Units into the skin at bedtime. 11/16/20  Yes Cassandria Anger, MD  isosorbide mononitrate (IMDUR) 30 MG 24 hr tablet Take 1 tablet (30 mg total) by mouth daily. 09/14/20  Yes Hayden Rasmussen, MD  lamoTRIgine (LAMICTAL) 25 MG tablet Take 50 mg by mouth 2 (two) times daily.  05/20/19  Yes [provider]  lisinopril (ZESTRIL) 20 MG tablet TAKE ONE TABLET BY MOUTH ONCE DAILY. Patient taking differently: Take 20 mg by mouth daily. 10/12/20  Yes Strader, Fransisco Hertz, PA-C  Melatonin 10 MG CAPS Take 1 capsule by mouth at bedtime as needed (for sleep).  06/12/19  Yes [provider]  oxyCODONE-acetaminophen (PERCOCET) 5-325 MG tablet Take 1-2 tablets by mouth every 4 (four) hours as needed for severe pain. 11/09/20  Yes [name], DPM  paliperidone (INVEGA) 6 MG 24 hr tablet Take 12 mg by mouth daily.  01/19/21 Yes [provider]  prazosin (MINIPRESS) 2 MG capsule Take 2-4 mg by mouth at bedtime as needed (for sleep).  12/03/19  Yes [provider]  ranolazine (RANEXA) 500 MG 12 hr tablet Take 1  tablet (500 mg total) by mouth 2 (two) times daily. 10/09/20  Yes Strader, [place] M, PA-C  risperiDONE (RISPERDAL) 3 MG tablet Take 3 mg by mouth at bedtime. 08/16/19  Yes [provider]  nitroGLYCERIN (NITROSTAT) 0.4 MG SL tablet Place 1 tablet (0.4 mg  total) under the tongue every 5 (five) minutes as needed for chest pain. 01/15/20 04/14/20  Herminio Commons, MD    Allergies    Aspirin  Review of Systems   Review of Systems  All other systems reviewed and are negative.   Physical Exam Updated Vital Signs BP (!) 178/96   Pulse (!) 49   Temp 98 F (36.7 C) (Oral)   Resp 15   Ht 5\' 7"  (1.702 m)   Wt 71.2 kg   SpO2 100%   BMI 24.59 kg/m   Physical Exam Vitals and nursing note reviewed. Exam conducted with a chaperone present.  Constitutional:      General: He is not in acute distress. HENT:     Head: Normocephalic and atraumatic.  Eyes:     General: No scleral icterus.    Extraocular Movements: Extraocular movements intact.     Conjunctiva/sclera: Conjunctivae normal.     Pupils: Pupils are equal, round, and reactive to light.  Neck:     Comments: No meningismus. Cardiovascular:     Rate and Rhythm: Normal rate.     Pulses: Normal pulses.  Pulmonary:     Effort: Pulmonary effort is normal. No respiratory distress.  Abdominal:     General: Abdomen is flat. There is no distension.     Palpations: Abdomen is soft.     Tenderness: There is no abdominal tenderness.  Musculoskeletal:        General: Normal range of motion.     Cervical back: Normal range of motion and neck supple. No rigidity or tenderness.     Comments: No midline spinal TTP.  Moves all extremities.  Peripheral pulses intact and symmetric.  Skin:    General: Skin is dry.  Neurological:     General: No focal deficit present.     Mental Status: He is alert and oriented to person, place, and time.     GCS: GCS eye subscore is 4. GCS verbal subscore is 5. GCS motor subscore is 6.   Psychiatric:        Mood and Affect: Mood normal.        Behavior: Behavior normal.        Thought Content: Thought content normal.     ED Results / Procedures / Treatments   Labs (all labs ordered are listed, but only abnormal results are displayed) Labs Reviewed  COMPREHENSIVE METABOLIC PANEL - Abnormal; Notable for the following components:      Result Value   Glucose, Bld 157 (*)    Creatinine, Ser 1.33 (*)    Calcium 8.2 (*)    Total Protein 5.5 (*)    Albumin 2.8 (*)    All other components within normal limits  CBC WITH DIFFERENTIAL/PLATELET - Abnormal; Notable for the following components:   WBC 3.8 (*)    RBC 4.12 (*)    Hemoglobin 12.8 (*)    HCT 37.3 (*)    Neutro Abs 1.2 (*)    All other components within normal limits  RAPID URINE DRUG SCREEN, HOSP PERFORMED - Abnormal; Notable for the following components:   Tetrahydrocannabinol POSITIVE (*)    All other components within normal limits  VALPROIC ACID LEVEL  ETHANOL  MAGNESIUM  LAMOTRIGINE LEVEL    EKG EKG Interpretation  Date/Time:  Tuesday Dec 15 2020 09:54:51 EDT Ventricular Rate:  50 PR Interval:  184 QRS Duration: 89 QT Interval:  455 QTC Calculation: 415 R Axis:   -52 Text Interpretation: Sinus rhythm  Left anterior fascicular block Anteroseptal infarct, age indeterminate Lateral leads are also involved No significant change since last tracing Confirmed by Aletta Edouard [phone]) on 12/15/2020 9:59:10 AM   Radiology No results found.  Procedures Procedures   Medications Ordered in ED Medications  prochlorperazine (COMPAZINE) injection 10 mg (10 mg Intravenous Given 12/15/20 1013)  diphenhydrAMINE (BENADRYL) injection 12.5 mg (12.5 mg Intravenous Given 12/15/20 1014)  sodium chloride 0.9 % bolus 1,000 mL (0 mLs Intravenous Stopped 12/15/20 1155)  valproate (DEPACON) 500 mg in dextrose 5 % 50 mL IVPB (0 mg Intravenous Stopped 12/15/20 1515)    ED Course  I have reviewed the triage vital  signs and the nursing notes.  Pertinent labs & imaging results that were available during my care of the patient were reviewed by me and considered in my medical decision making (see chart for details).  Clinical Course as of 12/15/20 1527  Tue Dec 15, 2020  1306 I spoke with Dr. Cheral Marker.  He recommended that we increase patient's Depakote to 1750 mg twice daily while also providing him with a supplemental load of 500 mg IV here in the ED.  He also asked that we double check a magnesium and Lamictal level on the patient.  He can follow-up with [facility] for ongoing evaluation and management. [GG]  4949 55 year old male with history of seizure disorder after likely seizure at home.  Something is been seizure-free for a while and now has had 2 seizures recently.  Neurology recommending increase Depakote. [MB]    Clinical Course User Index [GG] Corena Herter, PA-C [MB] Hayden Rasmussen, MD   MDM Rules/Calculators/A&P                          Lendon Ka Sohm was evaluated in Emergency Department on 12/15/2020 for the symptoms described in the history of present illness. He was evaluated in the context of the global COVID-19 pandemic, which necessitated consideration that the patient might be at risk for infection with the SARS-CoV-2 virus that causes COVID-19. Institutional protocols and algorithms that pertain to the evaluation of patients at risk for COVID-19 are in a state of rapid change based on information released by regulatory bodies including the CDC and federal and state organizations. These policies and algorithms were followed during the patient's care in the ED.  I personally reviewed patient's medical chart and all notes from triage and staff during today's encounter. I have also ordered and reviewed all labs and imaging that I felt to be medically necessary in the evaluation of this patient's complaints and with consideration of their physical exam. If needed, translation services were  available and utilized.   Patient is in the ED after waking up on the floor this morning, suspect that he had a seizure.  It was unwitnessed, he lives alone at home.  We will proceed with laboratory work-up including Depakote level and UDS.  We will also obtain EKG and keep him on cardiac monitoring.  CT head was obtained yesterday, no evidence of brain abscess or other space-occupying lesion that could be contributing to recurrence of seizures while on Depakote.  Depakote level was not checked yesterday.  I reviewed his medical record and his blood pressure has been previously well controlled around 120/80 earlier this year, but the past few ED encounters has demonstrated increased blood pressures in the area of 170/100.  I reviewed patient's medical record and he was referred to Dr. Leta Baptist with [facility]  from Dr. Joesph July on 07/16/2019.  At that time, there was no follow-up in epilepsy clinic and needed and there was no clear evidence of epilepsy.  Dr. Leta Baptist diagnosed him with partial symptomatic epilepsy with complex partial seizures.  Plan was for continued Depakote 1500 mg twice a day.  He was encouraged to abstain from cocaine.  I spoke with Dr. Cheral Marker, neurology.  He recommended that we increase patient's Depakote to 1750 mg twice daily while also providing him with a supplemental load of 500 mg IV here in the ED.  He also asked that we double check a magnesium and Lamictal level on the patient.  He can follow-up with [facility] for ongoing evaluation and management.  Magnesium level is within normal limits.  Lamotrigine still in-process.  He just received his IV valproate and feels prepared for discharge.  His blood pressure has been fluctuating, most recent 178/96.  This is consistent with recent encounters, but elevated when compared to 6 months ago.  Encouraging close outpatient follow-up with his primary care provider for medication adjustment.  He states that he is seeing them tomorrow.  ER return  precautions discussed.  Patient voices understanding is agreeable to the plan.  Final Clinical Impression(s) / ED Diagnoses Final diagnoses:  Seizure [facility])    Rx / [place] Orders ED Discharge Orders    None       Corena Herter, PA-C 12/15/20 1527    Corena Herter, PA-C 12/15/20 1546    Hayden Rasmussen, MD 12/15/20 1824

## 2020-12-15 NOTE — ED Triage Notes (Addendum)
Pt brought in by [facility] with c/o seizure at home this morning. Pt reports he fell to the ground when he had the seizure. He has been compliant with his seizure medication. Pt hasn't taken his BP medication today and BP for EMS was 200/109. EMS reports pt was not post ictal when they arrived. CBG 159 for EMS. Pt c/o dizziness and headache. Pt reports a lot of stress lately.

## 2020-12-15 NOTE — Discharge Instructions (Addendum)
Please increase your Depakote to 1750 mg twice daily from 1500 mg twice daily.  You will need to break one of your 5 mg tablets in half to achieve this.  You were given a supplemental 500 mg IV Depakote here in the ED.  Please follow-up with your primary care provider regarding today's ED encounter.  More importantly, call your neurologist at [facility] to schedule appointment for close outpatient follow-up.  Return to the ER seek immediate medical attention should experience any new or worsening symptoms.

## 2020-12-16 ENCOUNTER — Inpatient Hospital Stay (HOSPITAL_COMMUNITY): Payer: Medicaid Other

## 2020-12-16 ENCOUNTER — Inpatient Hospital Stay (HOSPITAL_COMMUNITY): Payer: Medicaid Other | Admitting: Anesthesiology

## 2020-12-16 ENCOUNTER — Inpatient Hospital Stay (HOSPITAL_COMMUNITY)
Admission: EM | Admit: 2020-12-16 | Discharge: 2020-12-21 | DRG: 024 | Disposition: A | Discharge status: Rehabilitation Facility | Payer: Medicaid Other | Attending: Neurology | Admitting: Neurology

## 2020-12-16 ENCOUNTER — Other Ambulatory Visit: Payer: Self-pay

## 2020-12-16 ENCOUNTER — Emergency Department (HOSPITAL_COMMUNITY): Payer: Medicaid Other

## 2020-12-16 ENCOUNTER — Encounter (HOSPITAL_COMMUNITY)
Admission: EM | Disposition: A | Discharge status: Rehabilitation Facility | Payer: Self-pay | Source: Home / Self Care | Attending: Neurology

## 2020-12-16 ENCOUNTER — Encounter (HOSPITAL_COMMUNITY): Payer: Self-pay | Admitting: Emergency Medicine

## 2020-12-16 DIAGNOSIS — E785 Hyperlipidemia, unspecified: Secondary | ICD-10-CM | POA: Diagnosis present

## 2020-12-16 DIAGNOSIS — I10 Essential (primary) hypertension: Secondary | ICD-10-CM

## 2020-12-16 DIAGNOSIS — G9349 Other encephalopathy: Secondary | ICD-10-CM | POA: Diagnosis present

## 2020-12-16 DIAGNOSIS — E1159 Type 2 diabetes mellitus with other circulatory complications: Secondary | ICD-10-CM | POA: Diagnosis not present

## 2020-12-16 DIAGNOSIS — Z86010 Personal history of colonic polyps: Secondary | ICD-10-CM

## 2020-12-16 DIAGNOSIS — F17210 Nicotine dependence, cigarettes, uncomplicated: Secondary | ICD-10-CM | POA: Diagnosis present

## 2020-12-16 DIAGNOSIS — E11649 Type 2 diabetes mellitus with hypoglycemia without coma: Secondary | ICD-10-CM | POA: Diagnosis not present

## 2020-12-16 DIAGNOSIS — E119 Type 2 diabetes mellitus without complications: Secondary | ICD-10-CM | POA: Diagnosis not present

## 2020-12-16 DIAGNOSIS — E1140 Type 2 diabetes mellitus with diabetic neuropathy, unspecified: Secondary | ICD-10-CM | POA: Diagnosis present

## 2020-12-16 DIAGNOSIS — Z794 Long term (current) use of insulin: Secondary | ICD-10-CM

## 2020-12-16 DIAGNOSIS — Z79899 Other long term (current) drug therapy: Secondary | ICD-10-CM

## 2020-12-16 DIAGNOSIS — R0789 Other chest pain: Secondary | ICD-10-CM | POA: Diagnosis present

## 2020-12-16 DIAGNOSIS — I252 Old myocardial infarction: Secondary | ICD-10-CM

## 2020-12-16 DIAGNOSIS — I129 Hypertensive chronic kidney disease with stage 1 through stage 4 chronic kidney disease, or unspecified chronic kidney disease: Secondary | ICD-10-CM | POA: Diagnosis present

## 2020-12-16 DIAGNOSIS — F319 Bipolar disorder, unspecified: Secondary | ICD-10-CM | POA: Diagnosis present

## 2020-12-16 DIAGNOSIS — F419 Anxiety disorder, unspecified: Secondary | ICD-10-CM | POA: Diagnosis present

## 2020-12-16 DIAGNOSIS — E1122 Type 2 diabetes mellitus with diabetic chronic kidney disease: Secondary | ICD-10-CM | POA: Diagnosis present

## 2020-12-16 DIAGNOSIS — R5383 Other fatigue: Secondary | ICD-10-CM | POA: Diagnosis not present

## 2020-12-16 DIAGNOSIS — I2510 Atherosclerotic heart disease of native coronary artery without angina pectoris: Secondary | ICD-10-CM | POA: Diagnosis present

## 2020-12-16 DIAGNOSIS — M109 Gout, unspecified: Secondary | ICD-10-CM | POA: Diagnosis present

## 2020-12-16 DIAGNOSIS — R29810 Facial weakness: Secondary | ICD-10-CM | POA: Diagnosis present

## 2020-12-16 DIAGNOSIS — G8929 Other chronic pain: Secondary | ICD-10-CM | POA: Diagnosis present

## 2020-12-16 DIAGNOSIS — Z823 Family history of stroke: Secondary | ICD-10-CM

## 2020-12-16 DIAGNOSIS — E440 Moderate protein-calorie malnutrition: Secondary | ICD-10-CM | POA: Insufficient documentation

## 2020-12-16 DIAGNOSIS — I639 Cerebral infarction, unspecified: Secondary | ICD-10-CM | POA: Diagnosis not present

## 2020-12-16 DIAGNOSIS — E1129 Type 2 diabetes mellitus with other diabetic kidney complication: Secondary | ICD-10-CM | POA: Diagnosis not present

## 2020-12-16 DIAGNOSIS — I63412 Cerebral infarction due to embolism of left middle cerebral artery: Secondary | ICD-10-CM | POA: Diagnosis present

## 2020-12-16 DIAGNOSIS — F209 Schizophrenia, unspecified: Secondary | ICD-10-CM | POA: Diagnosis present

## 2020-12-16 DIAGNOSIS — I729 Aneurysm of unspecified site: Secondary | ICD-10-CM | POA: Diagnosis not present

## 2020-12-16 DIAGNOSIS — Z8249 Family history of ischemic heart disease and other diseases of the circulatory system: Secondary | ICD-10-CM

## 2020-12-16 DIAGNOSIS — G40909 Epilepsy, unspecified, not intractable, without status epilepticus: Secondary | ICD-10-CM | POA: Diagnosis present

## 2020-12-16 DIAGNOSIS — M5412 Radiculopathy, cervical region: Secondary | ICD-10-CM | POA: Diagnosis present

## 2020-12-16 DIAGNOSIS — Z6824 Body mass index (BMI) 24.0-24.9, adult: Secondary | ICD-10-CM

## 2020-12-16 DIAGNOSIS — N179 Acute kidney failure, unspecified: Secondary | ICD-10-CM | POA: Diagnosis present

## 2020-12-16 DIAGNOSIS — R339 Retention of urine, unspecified: Secondary | ICD-10-CM | POA: Diagnosis not present

## 2020-12-16 DIAGNOSIS — R4701 Aphasia: Secondary | ICD-10-CM | POA: Diagnosis present

## 2020-12-16 DIAGNOSIS — G4700 Insomnia, unspecified: Secondary | ICD-10-CM | POA: Diagnosis present

## 2020-12-16 DIAGNOSIS — Z20822 Contact with and (suspected) exposure to covid-19: Secondary | ICD-10-CM | POA: Diagnosis present

## 2020-12-16 DIAGNOSIS — I69351 Hemiplegia and hemiparesis following cerebral infarction affecting right dominant side: Secondary | ICD-10-CM | POA: Diagnosis not present

## 2020-12-16 DIAGNOSIS — I63512 Cerebral infarction due to unspecified occlusion or stenosis of left middle cerebral artery: Secondary | ICD-10-CM | POA: Diagnosis present

## 2020-12-16 DIAGNOSIS — G4733 Obstructive sleep apnea (adult) (pediatric): Secondary | ICD-10-CM | POA: Diagnosis present

## 2020-12-16 DIAGNOSIS — Z951 Presence of aortocoronary bypass graft: Secondary | ICD-10-CM

## 2020-12-16 DIAGNOSIS — Z886 Allergy status to analgesic agent status: Secondary | ICD-10-CM

## 2020-12-16 DIAGNOSIS — Z981 Arthrodesis status: Secondary | ICD-10-CM

## 2020-12-16 DIAGNOSIS — N183 Chronic kidney disease, stage 3 unspecified: Secondary | ICD-10-CM | POA: Diagnosis present

## 2020-12-16 DIAGNOSIS — I672 Cerebral atherosclerosis: Secondary | ICD-10-CM | POA: Diagnosis present

## 2020-12-16 DIAGNOSIS — Z01818 Encounter for other preprocedural examination: Secondary | ICD-10-CM

## 2020-12-16 DIAGNOSIS — N289 Disorder of kidney and ureter, unspecified: Secondary | ICD-10-CM

## 2020-12-16 DIAGNOSIS — I6602 Occlusion and stenosis of left middle cerebral artery: Secondary | ICD-10-CM | POA: Diagnosis not present

## 2020-12-16 DIAGNOSIS — R29718 NIHSS score 18: Secondary | ICD-10-CM | POA: Diagnosis present

## 2020-12-16 DIAGNOSIS — Z833 Family history of diabetes mellitus: Secondary | ICD-10-CM

## 2020-12-16 DIAGNOSIS — Z7902 Long term (current) use of antithrombotics/antiplatelets: Secondary | ICD-10-CM

## 2020-12-16 HISTORY — PX: RADIOLOGY WITH ANESTHESIA: SHX6223

## 2020-12-16 LAB — DIFFERENTIAL
Abs Immature Granulocytes: 0 10*3/uL (ref 0.00–0.07)
Basophils Absolute: 0 10*3/uL (ref 0.0–0.1)
Basophils Relative: 1 %
Eosinophils Absolute: 0.1 10*3/uL (ref 0.0–0.5)
Eosinophils Relative: 3 %
Immature Granulocytes: 0 %
Lymphocytes Relative: 51 %
Lymphs Abs: 2.2 10*3/uL (ref 0.7–4.0)
Monocytes Absolute: 0.4 10*3/uL (ref 0.1–1.0)
Monocytes Relative: 9 %
Neutro Abs: 1.5 10*3/uL — ABNORMAL LOW (ref 1.7–7.7)
Neutrophils Relative %: 36 %

## 2020-12-16 LAB — I-STAT CHEM 8, ED
BUN: 21 mg/dL — ABNORMAL HIGH (ref 6–20)
Calcium, Ion: 1.13 mmol/L — ABNORMAL LOW (ref 1.15–1.40)
Chloride: 102 mmol/L (ref 98–111)
Creatinine, Ser: 2.2 mg/dL — ABNORMAL HIGH (ref 0.61–1.24)
Glucose, Bld: 159 mg/dL — ABNORMAL HIGH (ref 70–99)
HCT: 33 % — ABNORMAL LOW (ref 39.0–52.0)
Hemoglobin: 11.2 g/dL — ABNORMAL LOW (ref 13.0–17.0)
Potassium: 3.7 mmol/L (ref 3.5–5.1)
Sodium: 139 mmol/L (ref 135–145)
TCO2: 26 mmol/L (ref 22–32)

## 2020-12-16 LAB — COMPREHENSIVE METABOLIC PANEL
ALT: 12 U/L (ref 0–44)
AST: 16 U/L (ref 15–41)
Albumin: 2.8 g/dL — ABNORMAL LOW (ref 3.5–5.0)
Alkaline Phosphatase: 39 U/L (ref 38–126)
Anion gap: 6 (ref 5–15)
BUN: 22 mg/dL — ABNORMAL HIGH (ref 6–20)
CO2: 27 mmol/L (ref 22–32)
Calcium: 7.7 mg/dL — ABNORMAL LOW (ref 8.9–10.3)
Chloride: 102 mmol/L (ref 98–111)
Creatinine, Ser: 2.15 mg/dL — ABNORMAL HIGH (ref 0.61–1.24)
GFR, Estimated: 35 mL/min — ABNORMAL LOW (ref >60)
Glucose, Bld: 175 mg/dL — ABNORMAL HIGH (ref 70–99)
Potassium: 3.7 mmol/L (ref 3.5–5.1)
Sodium: 135 mmol/L (ref 135–145)
Total Bilirubin: 0.5 mg/dL (ref 0.3–1.2)
Total Protein: 5.1 g/dL — ABNORMAL LOW (ref 6.5–8.1)

## 2020-12-16 LAB — RESP PANEL BY RT-PCR (FLU A&B, COVID) ARPGX2
Influenza A by PCR: NEGATIVE
Influenza B by PCR: NEGATIVE
SARS Coronavirus 2 by RT PCR: NEGATIVE

## 2020-12-16 LAB — CBC
HCT: 34.5 % — ABNORMAL LOW (ref 39.0–52.0)
Hemoglobin: 12 g/dL — ABNORMAL LOW (ref 13.0–17.0)
MCH: 31.7 pg (ref 26.0–34.0)
MCHC: 34.8 g/dL (ref 30.0–36.0)
MCV: 91 fL (ref 80.0–100.0)
Platelets: 183 10*3/uL (ref 150–400)
RBC: 3.79 MIL/uL — ABNORMAL LOW (ref 4.22–5.81)
RDW: 12.3 % (ref 11.5–15.5)
WBC: 4.2 10*3/uL (ref 4.0–10.5)
nRBC: 0 % (ref 0.0–0.2)

## 2020-12-16 LAB — LAMOTRIGINE LEVEL: Lamotrigine Lvl: <1 ug/mL — ABNORMAL LOW (ref 2.0–20.0)

## 2020-12-16 LAB — PROTIME-INR
INR: 1 (ref 0.8–1.2)
Prothrombin Time: 13.1 seconds (ref 11.4–15.2)

## 2020-12-16 LAB — ETHANOL: Alcohol, Ethyl (B): <10 mg/dL (ref <10)

## 2020-12-16 LAB — CBG MONITORING, ED: Glucose-Capillary: 165 mg/dL — ABNORMAL HIGH (ref 70–99)

## 2020-12-16 LAB — APTT: aPTT: 29 seconds (ref 24–36)

## 2020-12-16 SURGERY — IR WITH ANESTHESIA
Anesthesia: General

## 2020-12-16 MED ORDER — NITROGLYCERIN 1 MG/10 ML FOR IR/CATH LAB
INTRA_ARTERIAL | Status: AC
  Administered 2020-12-17 (×3): 25 ug via INTRA_ARTERIAL
  Filled 2020-12-16: qty 10

## 2020-12-16 MED ORDER — ROCURONIUM BROMIDE 100 MG/10ML IV SOLN
INTRAVENOUS | Status: DC | PRN
  Administered 2020-12-16 – 2020-12-17 (×2): 50 mg via INTRAVENOUS

## 2020-12-16 MED ORDER — CLOPIDOGREL BISULFATE 300 MG PO TABS
ORAL_TABLET | ORAL | Status: AC
  Filled 2020-12-16: qty 1

## 2020-12-16 MED ORDER — CEFAZOLIN SODIUM-DEXTROSE 2-4 GM/100ML-% IV SOLN
INTRAVENOUS | Status: AC
  Filled 2020-12-16: qty 100

## 2020-12-16 MED ORDER — TICAGRELOR 90 MG PO TABS
ORAL_TABLET | ORAL | Status: AC
  Administered 2020-12-16: 180 mg via OROGASTRIC
  Filled 2020-12-16: qty 2

## 2020-12-16 MED ORDER — CANGRELOR TETRASODIUM 50 MG IV SOLR
INTRAVENOUS | Status: AC
  Filled 2020-12-16: qty 50

## 2020-12-16 MED ORDER — SUCCINYLCHOLINE CHLORIDE 20 MG/ML IJ SOLN
INTRAMUSCULAR | Status: DC | PRN
  Administered 2020-12-16: 100 mg via INTRAVENOUS

## 2020-12-16 MED ORDER — PHENYLEPHRINE HCL-NACL 10-0.9 MG/250ML-% IV SOLN
INTRAVENOUS | Status: DC | PRN
  Administered 2020-12-16: 15 ug/min via INTRAVENOUS

## 2020-12-16 MED ORDER — VERAPAMIL HCL 2.5 MG/ML IV SOLN
INTRAVENOUS | Status: AC
  Filled 2020-12-16: qty 2

## 2020-12-16 MED ORDER — FENTANYL CITRATE (PF) 100 MCG/2ML IJ SOLN
INTRAMUSCULAR | Status: DC | PRN
  Administered 2020-12-16: 100 ug via INTRAVENOUS

## 2020-12-16 MED ORDER — SODIUM CHLORIDE 0.9 % IV BOLUS
500.0000 mL | Freq: Once | INTRAVENOUS | Status: AC
  Administered 2020-12-16: 500 mL via INTRAVENOUS

## 2020-12-16 MED ORDER — ASPIRIN 81 MG PO CHEW
CHEWABLE_TABLET | ORAL | Status: AC
  Filled 2020-12-16: qty 1

## 2020-12-16 MED ORDER — DEXAMETHASONE SODIUM PHOSPHATE 4 MG/ML IJ SOLN
INTRAMUSCULAR | Status: DC | PRN
  Administered 2020-12-16: 5 mg via INTRAVENOUS

## 2020-12-16 MED ORDER — SODIUM CHLORIDE 0.9 % IV SOLN: INTRAVENOUS | Status: DC | PRN

## 2020-12-16 MED ORDER — CLEVIDIPINE BUTYRATE 0.5 MG/ML IV EMUL
INTRAVENOUS | Status: AC
  Filled 2020-12-16: qty 50

## 2020-12-16 MED ORDER — CEFAZOLIN SODIUM-DEXTROSE 2-3 GM-%(50ML) IV SOLR
INTRAVENOUS | Status: DC | PRN
  Administered 2020-12-16: 2 g via INTRAVENOUS

## 2020-12-16 MED ORDER — LIDOCAINE HCL (CARDIAC) PF 100 MG/5ML IV SOSY
PREFILLED_SYRINGE | INTRAVENOUS | Status: DC | PRN
  Administered 2020-12-16: 60 mg via INTRAVENOUS

## 2020-12-16 MED ORDER — EPTIFIBATIDE 20 MG/10ML IV SOLN
INTRAVENOUS | Status: AC
  Filled 2020-12-16: qty 10

## 2020-12-16 MED ORDER — PROPOFOL 10 MG/ML IV BOLUS
INTRAVENOUS | Status: DC | PRN
  Administered 2020-12-16: 20 mg via INTRAVENOUS
  Administered 2020-12-16: 30 mg via INTRAVENOUS
  Administered 2020-12-16: 150 mg via INTRAVENOUS

## 2020-12-16 MED ORDER — IOHEXOL 350 MG/ML SOLN
100.0000 mL | Freq: Once | INTRAVENOUS | Status: AC | PRN
  Administered 2020-12-16: 100 mL via INTRAVENOUS

## 2020-12-16 MED ORDER — TIROFIBAN HCL IN NACL 5-0.9 MG/100ML-% IV SOLN
INTRAVENOUS | Status: AC
  Filled 2020-12-16: qty 100

## 2020-12-16 NOTE — ED Notes (Signed)
Pt left with [facility] to go to IR

## 2020-12-16 NOTE — Anesthesia Procedure Notes (Signed)
Arterial Line Insertion Start/End5/18/2022 10:37 PM, 12/16/2020 10:40 PM Performed by: Oletta Lamas, CRNA  Patient location: Pre-op. Preanesthetic checklist: patient identified, IV checked, site marked, risks and benefits discussed, surgical consent, monitors and equipment checked, pre-op evaluation, timeout performed and anesthesia consent Lidocaine 1% used for infiltration Left, radial was placed Catheter size: 20 Fr Hand hygiene performed  and maximum sterile barriers used   Attempts: 2 Procedure performed using ultrasound guided technique. Following insertion, dressing applied. Post procedure assessment: normal and unchanged

## 2020-12-16 NOTE — ED Provider Notes (Signed)
[facility] EMERGENCY DEPARTMENT Provider Note   CSN: [id number] Arrival date & time: 12/16/20  2004  An emergency department physician performed an initial assessment on this suspected stroke patient at 2004.  History Chief Complaint  Patient presents with  . Code Stroke    Melvin Taylor is a 55 y.o. male.  HPI Patient presents BMS for evaluation of suspected stroke.  Per EMS he began having weakness of his right face and right arm about 35 minutes prior to their arrival here.  Patient had been evaluated here yesterday after some seizure activity.  Per EMS today there was no reported seizure activity, but that the patient walked to his truck then became unresponsive.  This occurred just prior to EMS being contacted.  On arrival patient unable to give any history.  Level 5 caveat-altered mental status    Past Medical History:  Diagnosis Date  . Anxiety   . Bipolar 1 disorder (HCC)   . Bone spur    left heel  . Cervical radiculopathy   . Chest pain   . Chronic back pain   . Chronic chest wall pain   . Chronic neck pain   . Coronary artery disease    a. s/p CABG in 03/2016 with LIMA-LAD, SVG-D1, SVG-RCA, and Seq SVG-mid and distal OM  . Depression   . Diabetes mellitus    Type II  . Hallucinations    "long history of them"  . Headache(784.0)   . History of gout   . HTN (hypertension)   . Insomnia   . Myocardial infarction (HCC) 2017  . Neuropathy   . Pain management   . Polysubstance abuse (HCC)   . Right leg pain    chronic  . Schizophrenia (HCC)   . Seizures (HCC)    last sz between July 5-9th, 2016; epilepsy  . Shortness of breath dyspnea    with exertion  . Sleep apnea   . Transfusion of blood product refused for religious reason     Patient Active Problem List   Diagnosis Date Noted  . Acute ischemic left MCA stroke (HCC) 12/16/2020  . Current smoker 06/21/2019  . Seizure-like activity (HCC) 05/06/2019  . Type 2 diabetes mellitus with stage 3  chronic kidney disease, with long-term current use of insulin (HCC) 04/11/2019  . Anemia, chronic disease 07/04/2018  . Other specified diseases of the digestive system 10/30/2017  . CAD (coronary artery disease) 03/28/2016  . Chest pain 03/21/2016  . Mixed hyperlipidemia 03/11/2016  . Ischemic chest pain (HCC)   . NSTEMI (non-ST elevated myocardial infarction) (HCC)   . Coronary atherosclerosis of native coronary artery 03/03/2016  . Cervical spinal stenosis 12/14/2015  . Loss of weight 08/25/2015  . Essential hypertension, benign 07/23/2015  . Personal history of noncompliance with medical treatment, presenting hazards to health 07/23/2015  . Abdominal pain 06/08/2015  . Constipation 06/08/2015  . History of colonic polyps   . Diverticulosis of colon without hemorrhage   . Mucosal abnormality of stomach   . Encounter for screening colonoscopy 04/16/2015  . Dysphagia 04/16/2015  . Partial symptomatic epilepsy with complex partial seizures, intractable, without status epilepticus (HCC) 11/10/2014  . Bipolar disorder, unspecified (HCC) 10/15/2014  . Schizophrenia (HCC) 10/15/2014  . DM type 2 causing vascular disease (HCC) 06/29/2014  . Musculoskeletal pain 06/29/2014  . Hyperglycemia without ketosis 09/07/2013  . 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 Other internal derangements of unspecified knee 01/13/2011  . Knee pain 12/28/2010  .  Medial meniscus, posterior horn derangement 12/28/2010    Past Surgical History:  Procedure Laterality Date  . ANTERIOR CERVICAL DECOMP/DISCECTOMY FUSION N/A 12/14/2015   Procedure: ANTERIOR CERVICAL DECOMPRESSION/DISCECTOMY FUSION CERVICAL FIVE -SIX;  Surgeon: Earnie Larsson, MD;  Location: [place] [facility];  Service: Neurosurgery;  Laterality: N/A;  . BIOPSY  05/07/2015   Procedure: BIOPSY (Gastric);  Surgeon: [name], MD;  Location: [facility];  Service: Endoscopy;;  . CARDIAC CATHETERIZATION N/A 03/07/2016   Procedure: Left Heart Cath  and Coronary Angiography;  Surgeon: Belva Crome, MD;  Location: [place] [facility];  Service: Cardiovascular;  Laterality: N/A;  . COLONOSCOPY WITH PROPOFOL N/A 05/07/2015   [id number] diverticulosis, multiple colon polyps removed, tubular adenoma, serrated colon polyp. Next colonoscopy October 2019  . COLONOSCOPY WITH PROPOFOL N/A 08/02/2018   Procedure: COLONOSCOPY WITH PROPOFOL;  Surgeon: [name], MD;  Location: [facility];  Service: Endoscopy;  Laterality: N/A;  12:00pm  . CORONARY ARTERY BYPASS GRAFT N/A 03/28/2016   Procedure: CORONARY ARTERY BYPASS GRAFTING (CABG) x 5 USING GREATER SAPHENOUS VEIN;  Surgeon: [name], MD;  Location: [place] [facility];  Service: Open Heart Surgery;  Laterality: N/A;  . ENDOVEIN HARVEST OF GREATER SAPHENOUS VEIN Right 03/28/2016   Procedure: ENDOVEIN HARVEST OF GREATER SAPHENOUS VEIN;  Surgeon: [name], MD;  Location: [place];  Service: Open Heart Surgery;  Laterality: Right;  . ESOPHAGEAL DILATION N/A 05/07/2015   Procedure: ESOPHAGEAL DILATION WITH 56FR MALONEY DILATOR;  Surgeon: [name], MD;  Location: [facility];  Service: Endoscopy;  Laterality: N/A;  . ESOPHAGOGASTRODUODENOSCOPY (EGD) WITH PROPOFOL N/A 05/07/2015   RMR: Status post dilation of normal esophagus. Gastritis.  Marland Kitchen KNEE SURGERY Left    arthroscopy  . MANDIBLE FRACTURE SURGERY    . POLYPECTOMY  05/07/2015   Procedure: POLYPECTOMY (Hepatic Flexure, Distal Transverse Colon, Rectal);  Surgeon: [name], MD;  Location: [facility];  Service: Endoscopy;;  . TEE WITHOUT CARDIOVERSION N/A 03/28/2016   Procedure: TRANSESOPHAGEAL ECHOCARDIOGRAM (TEE);  Surgeon: [name], MD;  Location: [place];  Service: Open Heart Surgery;  Laterality: N/A;  . TONSILLECTOMY         Family History  Problem Relation Age of Onset  . Diabetes Father   . Hypertension Father   . Arthritis Other   . Diabetes Other   . Asthma Other   . Stroke Sister     Social History   Tobacco Use  . Smoking  status: Current Every Day Smoker    Packs/day: 0.50    Years: 30.00    Pack years: 15.00    Types: Cigarettes  . Smokeless tobacco: Never Used  . Tobacco comment: 3 cigs per day  Vaping Use  . Vaping Use: Never used  Substance Use Topics  . Alcohol use: No  . Drug use: Yes    Types: Marijuana    Home Medications Prior to Admission medications   Medication Sig Start Date End Date Taking? Authorizing Provider  albuterol (PROVENTIL HFA;VENTOLIN HFA) 108 (90 Base) MCG/ACT inhaler Inhale 2 puffs into the lungs every 4 (four) hours as needed for wheezing or shortness of breath.     [provider]  amLODipine (NORVASC) 10 MG tablet Take 1 tablet (10 mg total) by mouth daily. 04/14/20   Verta Ellen., NP  atorvastatin (LIPITOR) 40 MG tablet Take 40 mg by mouth daily. Take 1 tablet at bedtime.    [provider]  baclofen (LIORESAL) 10 MG  tablet Take 10 mg by mouth 2 (two) times daily. 02/18/20   [provider]  clopidogrel (PLAVIX) 75 MG tablet Take 75 mg by mouth daily.    [provider]  diazepam (VALIUM) 5 MG tablet Take 5 mg by mouth 2 (two) times daily as needed for anxiety.     [provider]  divalproex (DEPAKOTE) 500 MG DR tablet Take 3 tablets (1,500 mg total) by mouth 2 (two) times daily. 06/02/20   Penumalli, Earlean Polka, MD  escitalopram (LEXAPRO) 20 MG tablet Take 20 mg by mouth daily.     [provider]  ibuprofen (ADVIL) 800 MG tablet Take 800 mg by mouth every 6 (six) hours as needed for moderate pain. 11/17/20   [provider]  insulin aspart (NOVOLOG) 100 UNIT/ML FlexPen Inject 4-10 Units into the skin 3 (three) times daily with meals. 09/09/20   Cassandria Anger, MD  insulin glargine (LANTUS SOLOSTAR) 100 UNIT/ML Solostar Pen Inject 40 Units into the skin at bedtime. 11/16/20   Cassandria Anger, MD  isosorbide mononitrate (IMDUR) 30 MG 24 hr tablet Take 1 tablet (30 mg total) by mouth daily. 09/14/20    Hayden Rasmussen, MD  lamoTRIgine (LAMICTAL) 25 MG tablet Take 50 mg by mouth 2 (two) times daily.  05/20/19   [provider]  lisinopril (ZESTRIL) 20 MG tablet TAKE ONE TABLET BY MOUTH ONCE DAILY. Patient taking differently: Take 20 mg by mouth daily. 10/12/20   Strader, Fransisco Hertz, PA-C  Melatonin 10 MG CAPS Take 1 capsule by mouth at bedtime as needed (for sleep).  06/12/19   [provider]  nitroGLYCERIN (NITROSTAT) 0.4 MG SL tablet Place 1 tablet (0.4 mg total) under the tongue every 5 (five) minutes as needed for chest pain. 01/15/20 04/14/20  Herminio Commons, MD  oxyCODONE-acetaminophen (PERCOCET) 5-325 MG tablet Take 1-2 tablets by mouth every 4 (four) hours as needed for severe pain. 11/09/20   [name], DPM  paliperidone (INVEGA) 6 MG 24 hr tablet Take 12 mg by mouth daily.  01/19/21  [provider]  prazosin (MINIPRESS) 2 MG capsule Take 2-4 mg by mouth at bedtime as needed (for sleep).  12/03/19   [provider]  ranolazine (RANEXA) 500 MG 12 hr tablet Take 1 tablet (500 mg total) by mouth 2 (two) times daily. 10/09/20   Strader, Fransisco Hertz, PA-C  risperiDONE (RISPERDAL) 3 MG tablet Take 3 mg by mouth at bedtime. 08/16/19   [provider]    Allergies    Aspirin  Review of Systems   Review of Systems  Unable to perform ROS: Mental status change    Physical Exam Updated Vital Signs BP (!) 175/94   Pulse 63   Temp 97.8 F (36.6 C) (Axillary)   Resp 13   Ht 5\' 7"  (1.702 m)   Wt 71.2 kg   SpO2 100%   BMI 24.59 kg/m   Physical Exam Vitals and nursing note reviewed.  Constitutional:      General: He is in acute distress.     Appearance: He is well-developed. He is ill-appearing. He is not toxic-appearing or diaphoretic.  HENT:     Head: Normocephalic and atraumatic.     Right Ear: External ear normal.     Left Ear: External ear normal.     Mouth/Throat:     Comments: Drooling clear saliva from right side of  mouth Eyes:     Conjunctiva/sclera: Conjunctivae normal.  Pupils: Pupils are equal, round, and reactive to light.  Neck:     Trachea: Phonation normal.  Cardiovascular:     Rate and Rhythm: Normal rate.  Pulmonary:     Effort: Pulmonary effort is normal.  Abdominal:     Palpations: Abdomen is soft.     Tenderness: There is no abdominal tenderness.  Musculoskeletal:        General: No swelling or tenderness.     Cervical back: Normal range of motion and neck supple.  Skin:    General: Skin is warm and dry.     Coloration: Skin is not jaundiced or pale.  Neurological:     Mental Status: He is alert.     Motor: No abnormal muscle tone.     Comments: Right facial droop, flaccid right arm, flaccid right leg  Psychiatric:     Comments: Obtunded     ED Results / Procedures / Treatments   Labs (all labs ordered are listed, but only abnormal results are displayed) Labs Reviewed  CBC - Abnormal; Notable for the following components:      Result Value   RBC 3.79 (*)    Hemoglobin 12.0 (*)    HCT 34.5 (*)    All other components within normal limits  DIFFERENTIAL - Abnormal; Notable for the following components:   Neutro Abs 1.5 (*)    All other components within normal limits  COMPREHENSIVE METABOLIC PANEL - Abnormal; Notable for the following components:   Glucose, Bld 175 (*)    BUN 22 (*)    Creatinine, Ser 2.15 (*)    Calcium 7.7 (*)    Total Protein 5.1 (*)    Albumin 2.8 (*)    GFR, Estimated 35 (*)    All other components within normal limits  CBG MONITORING, ED - Abnormal; Notable for the following components:   Glucose-Capillary 165 (*)    All other components within normal limits  RESP PANEL BY RT-PCR (FLU A&B, COVID) ARPGX2  ETHANOL  PROTIME-INR  APTT  RAPID URINE DRUG SCREEN, HOSP PERFORMED  URINALYSIS, ROUTINE W REFLEX MICROSCOPIC  VALPROIC ACID LEVEL  I-STAT CHEM 8, ED    EKG None  Radiology CT CEREBRAL PERFUSION W CONTRAST  Result Date:  12/16/2020 CLINICAL DATA:  Right-sided deficits EXAM: CT ANGIOGRAPHY HEAD AND NECK CT PERFUSION BRAIN TECHNIQUE: Multidetector CT imaging of the head and neck was performed using the standard protocol during bolus administration of intravenous contrast. Multiplanar CT image reconstructions and MIPs were obtained to evaluate the vascular anatomy. Carotid stenosis measurements (when applicable) are obtained utilizing NASCET criteria, using the distal internal carotid diameter as the denominator. Multiphase CT imaging of the brain was performed following IV bolus contrast injection. Subsequent parametric perfusion maps were calculated using RAPID software. CONTRAST:  123mL OMNIPAQUE IOHEXOL 350 MG/ML SOLN COMPARISON:  None. FINDINGS: CTA NECK FINDINGS SKELETON: There is no bony spinal canal stenosis. No lytic or blastic lesion. OTHER NECK: Normal pharynx, larynx and major salivary glands. No cervical lymphadenopathy. Unremarkable thyroid gland. UPPER CHEST: No pneumothorax or pleural effusion. No nodules or masses. AORTIC ARCH: There is no calcific atherosclerosis of the aortic arch. There is no aneurysm, dissection or hemodynamically significant stenosis of the visualized portion of the aorta. Conventional 3 vessel aortic branching pattern. The visualized proximal subclavian arteries are widely patent. RIGHT CAROTID SYSTEM: Normal without aneurysm, dissection or stenosis. LEFT CAROTID SYSTEM: Normal without aneurysm, dissection or stenosis. VERTEBRAL ARTERIES: Left dominant configuration. Both origins are clearly patent. There  is no dissection, occlusion or flow-limiting stenosis to the skull base (V1-V3 segments). CTA HEAD FINDINGS POSTERIOR CIRCULATION: --Vertebral arteries: Normal V4 segments. --Inferior cerebellar arteries: Normal. --Basilar artery: Normal. --Superior cerebellar arteries: Normal. --Posterior cerebral arteries (PCA): Multifocal moderate-to-severe stenosis of the right P2 segment. Normal left.  ANTERIOR CIRCULATION: --Intracranial internal carotid arteries: Normal. --Anterior cerebral arteries (ACA): Normal. Both A1 segments are present. Patent anterior communicating artery (a-comm). --Middle cerebral arteries (MCA): Complete occlusion of the left MCA at the proximal M1 segment with limited collateralization. Right MCA is normal. VENOUS SINUSES: As permitted by contrast timing, patent. ANATOMIC VARIANTS: None Review of the MIP images confirms the above findings. CT Brain Perfusion Findings: ASPECTS: 9 CBF (<30%) Volume: 59mL Perfusion (Tmax>6.0s) volume: 181mL Mismatch Volume: 19mL, likely slightly overestimated due to small area of completed infarction seen on the noncontrast head CT. Infarction Location:Left MCA territory IMPRESSION: 1. 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 2. Large area of ischemic penumbra in the left MCA territory. 3. 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 Critical Value/emergent results were called by telephone at the time of interpretation on 12/16/2020 at 9:02 pm to provider Christ Kick , who verbally acknowledged these results. Aortic Atherosclerosis (ICD10-I70.0). Electronically Signed   By: Ulyses Jarred M.D.   On: 12/16/2020 21:08   CT HEAD CODE STROKE WO CONTRAST  Result Date: 12/16/2020 CLINICAL DATA:  Code stroke.  Right-sided deficits EXAM: CT HEAD WITHOUT CONTRAST TECHNIQUE: Contiguous axial images were obtained from the base of the skull through the vertex without intravenous contrast. COMPARISON:  12/12/2020 FINDINGS: Brain: There is no mass, hemorrhage or extra-axial collection. The size and configuration of the ventricles and extra-axial CSF spaces are normal. Area of hypoattenuation in the inferior left frontal lobe, likely early subacute infarct. Small vessel infarcts in the left corona radiata are unchanged. Vascular: No  abnormal hyperdensity of the major intracranial arteries or dural venous sinuses. No intracranial atherosclerosis. Skull: The visualized skull base, calvarium and extracranial soft tissues are normal. Sinuses/Orbits: No fluid levels or advanced mucosal thickening of the visualized paranasal sinuses. No mastoid or middle ear effusion. The orbits are normal. ASPECTS [facility] Stroke Program Early CT Score) - Ganglionic level infarction (caudate, lentiform nuclei, internal capsule, insula, M1-M3 cortex): 6 - Supraganglionic infarction (M4-M6 cortex): 3 Total score (0-10 with 10 being normal): 9 IMPRESSION: 1. No acute intracranial hemorrhage. 2. Suspected early subacute infarct of the inferior left frontal lobe. 3. ASPECTS is 9. These results were called by telephone at the time of interpretation on 12/16/2020 at 8:25 pm to provider [facility] , who verbally acknowledged these results. Electronically Signed   By: Ulyses Jarred M.D.   On: 12/16/2020 20:26   CT ANGIO HEAD CODE STROKE  Result Date: 12/16/2020 CLINICAL DATA:  Right-sided deficits EXAM: CT ANGIOGRAPHY HEAD AND NECK CT PERFUSION BRAIN TECHNIQUE: Multidetector CT imaging of the head and neck was performed using the standard protocol during bolus administration of intravenous contrast. Multiplanar CT image reconstructions and MIPs were obtained to evaluate the vascular anatomy. Carotid stenosis measurements (when applicable) are obtained utilizing NASCET criteria, using the distal internal carotid diameter as the denominator. Multiphase CT imaging of the brain was performed following IV bolus contrast injection. Subsequent parametric perfusion maps were calculated using RAPID software. CONTRAST:  117mL OMNIPAQUE IOHEXOL 350 MG/ML SOLN COMPARISON:  None. FINDINGS: CTA NECK FINDINGS SKELETON: There is no bony spinal canal stenosis. No  lytic or blastic lesion. OTHER NECK: Normal pharynx, larynx and major salivary glands. No cervical lymphadenopathy.  Unremarkable thyroid gland. UPPER CHEST: No pneumothorax or pleural effusion. No nodules or masses. AORTIC ARCH: There is no calcific atherosclerosis of the aortic arch. There is no aneurysm, dissection or hemodynamically significant stenosis of the visualized portion of the aorta. Conventional 3 vessel aortic branching pattern. The visualized proximal subclavian arteries are widely patent. RIGHT CAROTID SYSTEM: Normal without aneurysm, dissection or stenosis. LEFT CAROTID SYSTEM: Normal without aneurysm, dissection or stenosis. VERTEBRAL ARTERIES: Left dominant configuration. Both origins are clearly patent. There is no dissection, occlusion or flow-limiting stenosis to the skull base (V1-V3 segments). CTA HEAD FINDINGS POSTERIOR CIRCULATION: --Vertebral arteries: Normal V4 segments. --Inferior cerebellar arteries: Normal. --Basilar artery: Normal. --Superior cerebellar arteries: Normal. --Posterior cerebral arteries (PCA): Multifocal moderate-to-severe stenosis of the right P2 segment. Normal left. ANTERIOR CIRCULATION: --Intracranial internal carotid arteries: Normal. --Anterior cerebral arteries (ACA): Normal. Both A1 segments are present. Patent anterior communicating artery (a-comm). --Middle cerebral arteries (MCA): Complete occlusion of the left MCA at the proximal M1 segment with limited collateralization. Right MCA is normal. VENOUS SINUSES: As permitted by contrast timing, patent. ANATOMIC VARIANTS: None Review of the MIP images confirms the above findings. CT Brain Perfusion Findings: ASPECTS: 9 CBF (<30%) Volume: 74mL Perfusion (Tmax>6.0s) volume: 171mL Mismatch Volume: 168mL, likely slightly overestimated due to small area of completed infarction seen on the noncontrast head CT. Infarction Location:Left MCA territory IMPRESSION: 1. 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 2. Large area of ischemic penumbra in the left  MCA territory. 3. 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 Critical Value/emergent results were called by telephone at the time of interpretation on 12/16/2020 at 9:02 pm to provider Christ Kick , who verbally acknowledged these results. Aortic Atherosclerosis (ICD10-I70.0). Electronically Signed   By: Ulyses Jarred M.D.   On: 12/16/2020 21:08   CT ANGIO NECK CODE STROKE  Result Date: 12/16/2020 CLINICAL DATA:  Right-sided deficits EXAM: CT ANGIOGRAPHY HEAD AND NECK CT PERFUSION BRAIN TECHNIQUE: Multidetector CT imaging of the head and neck was performed using the standard protocol during bolus administration of intravenous contrast. Multiplanar CT image reconstructions and MIPs were obtained to evaluate the vascular anatomy. Carotid stenosis measurements (when applicable) are obtained utilizing NASCET criteria, using the distal internal carotid diameter as the denominator. Multiphase CT imaging of the brain was performed following IV bolus contrast injection. Subsequent parametric perfusion maps were calculated using RAPID software. CONTRAST:  143mL OMNIPAQUE IOHEXOL 350 MG/ML SOLN COMPARISON:  None. FINDINGS: CTA NECK FINDINGS SKELETON: There is no bony spinal canal stenosis. No lytic or blastic lesion. OTHER NECK: Normal pharynx, larynx and major salivary glands. No cervical lymphadenopathy. Unremarkable thyroid gland. UPPER CHEST: No pneumothorax or pleural effusion. No nodules or masses. AORTIC ARCH: There is no calcific atherosclerosis of the aortic arch. There is no aneurysm, dissection or hemodynamically significant stenosis of the visualized portion of the aorta. Conventional 3 vessel aortic branching pattern. The visualized proximal subclavian arteries are widely patent. RIGHT CAROTID SYSTEM: Normal without aneurysm, dissection or stenosis. LEFT CAROTID SYSTEM: Normal without aneurysm, dissection or stenosis. VERTEBRAL ARTERIES: Left dominant  configuration. Both origins are clearly patent. There is no dissection, occlusion or flow-limiting stenosis to the skull base (V1-V3 segments). CTA HEAD FINDINGS POSTERIOR CIRCULATION: --Vertebral arteries: Normal V4 segments. --Inferior cerebellar arteries: Normal. --Basilar artery: Normal. --Superior cerebellar arteries: Normal. --Posterior cerebral arteries (  PCA): Multifocal moderate-to-severe stenosis of the right P2 segment. Normal left. ANTERIOR CIRCULATION: --Intracranial internal carotid arteries: Normal. --Anterior cerebral arteries (ACA): Normal. Both A1 segments are present. Patent anterior communicating artery (a-comm). --Middle cerebral arteries (MCA): Complete occlusion of the left MCA at the proximal M1 segment with limited collateralization. Right MCA is normal. VENOUS SINUSES: As permitted by contrast timing, patent. ANATOMIC VARIANTS: None Review of the MIP images confirms the above findings. CT Brain Perfusion Findings: ASPECTS: 9 CBF (<30%) Volume: 87mL Perfusion (Tmax>6.0s) volume: Mismatch Volume: , likely slightly overestimated due to small area of completed infarction seen on the noncontrast head CT. Infarction Location:Left MCA territory IMPRESSION: 1. 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 2. Large area of ischemic penumbra in the left MCA territory. 3. 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 Critical Value/emergent results were called by telephone at the time of interpretation on 12/16/2020 at 9:02 pm to provider Gray Bernhardt , who verbally acknowledged these results. Aortic Atherosclerosis (ICD10-I70.0). Electronically Signed   By: Deatra Robinson M.D.   On: 12/16/2020 21:08    Procedures .Critical Care Performed by: Mancel Bale, MD Authorized by: Mancel Bale, MD   Critical care provider statement:    Critical care time (minutes):   80   Critical care start time:  12/16/2020 8:04 PM   Critical care end time:  12/16/2020 9:50 PM   Critical care time was exclusive of:  Separately billable procedures and treating other patients   Critical care was time spent personally by me on the following activities:  Blood draw for specimens, development of treatment plan with patient or surrogate, discussions with consultants, evaluation of patient's response to treatment, examination of patient, obtaining history from patient or surrogate, ordering and performing treatments and interventions, ordering and review of laboratory studies, pulse oximetry, re-evaluation of patient's condition, review of old charts and ordering and review of radiographic studies     Medications Ordered in ED Medications  iohexol (OMNIPAQUE) 350 MG/ML injection 100 mL (100 mLs Intravenous Contrast Given 12/16/20 2054)    ED Course  I have reviewed the triage vital signs and the nursing notes.  Pertinent labs & imaging results that were available during my care of the patient were reviewed by me and considered in my medical decision making (see chart for details).  Clinical Course as of 12/16/20 2150  Wed Dec 16, 2020  2028 Case discussed with reading radiologist, regarding left frontal stroke seen on CT image [EW]  2130 Patient is alert.  He is nonverbal.  He continues to have right-sided paralysis and has a right-sided neglect.  No respiratory distress.  Mild hypertension noted on vital signs [EW]  2137 I have had verbal conversations with the neuro hospitalist, telemetry specialist.  Dr. Orson Aloe has talked to Dr. Iver Nestle, with [facility] neurology.  Patient has been activated for IR intervention.  Dr. Corliss Skains will treat the patient. [EW]  2139 EMS called for transfer. [EW]    Clinical Course User Index [EW] Mancel Bale, MD   MDM Rules/Calculators/A&P                           Patient Vitals for the past 24 hrs:  BP Temp Temp src Pulse Resp SpO2 Height  Weight  12/16/20 2133 (!) 175/94 -- -- 63 13 100 % -- --  12/16/20 2024 (!) 193/108 97.8 F (36.6  C) Axillary 62 18 99 % -- --  12/16/20 2022 -- -- -- -- -- -- 5\' 7"  (1.702 m) 71.2 kg    9:40 PM Reevaluation with update and discussion. After initial assessment and treatment, an updated evaluation reveals he is stable for transfer.Daleen Bo   Medical Decision Making:  This patient is presenting for evaluation of acute onset of stroke within 40 minutes, which does require a range of treatment options, and is a complaint that involves a high risk of morbidity and mortality. The differential diagnoses include TIA, stroke, seizure. I decided to review old records, and in summary middle-aged male with a history of seizures, presenting now with new symptoms of right-sided weakness and altered mental status.  I received additional historical information from EMS.  Clinical Laboratory Tests Ordered, included CBC, Metabolic panel and Viral panel. Review indicates normal except glucose high, BUN high, creatinine high, calcium low, total protein low, albumin low, hemoglobin low. Radiologic Tests Ordered, included initial head CT, follow-up advanced imaging.  I independently Visualized: Radiologic images, which show left frontal acute/subacute CVA, M1 occlusion on angiogram  Cardiac Monitor Tracing which shows normal sinus rhythm    Critical Interventions-clinical evaluation, code stroke initiation, radiography, laboratory testing, observation, discussion with outside teleneurology, and  neurology.  Transfer arrangements  After These Interventions, the Patient was reevaluated and was found with acute stroke, requiring thrombectomy, therefore transfer for interventional radiology intervention.  CRITICAL CARE-yes Performed by: Daleen Bo  Nursing Notes Reviewed/ Care Coordinated Applicable Imaging Reviewed Interpretation of Laboratory Data incorporated into ED treatment   Plan  transfer to neurology service at [facility].    Final Clinical Impression(s) / ED Diagnoses Final diagnoses:  Acute ischemic stroke [facility])  Renal insufficiency    Rx / DC Orders ED Discharge Orders    None       Daleen Bo, MD 12/18/20 1042

## 2020-12-16 NOTE — Anesthesia Procedure Notes (Signed)
Procedure Name: Intubation Date/Time: 12/16/2020 10:37 PM Performed by: Clearnce Sorrel, CRNA Pre-anesthesia Checklist: Patient identified, Emergency Drugs available, Suction available, Patient being monitored and Timeout performed Patient Re-evaluated:Patient Re-evaluated prior to induction Oxygen Delivery Method: Circle system utilized Preoxygenation: Pre-oxygenation with 100% oxygen Induction Type: IV induction and Rapid sequence Laryngoscope Size: McGraph and 4 Grade View: Grade I Tube type: Oral Tube size: 7.5 mm Number of attempts: 1 Airway Equipment and Method: Stylet Secured at: 23 cm Tube secured with: Tape Dental Injury: Teeth and Oropharynx as per pre-operative assessment

## 2020-12-16 NOTE — ED Provider Notes (Deleted)
8:02 PM-Case discussed with TTS, nurse practitioner Hoyle Sauer.  She states patient is stable for discharge.  I have reversed the IVC, as the patient is calm and goal oriented.  He will follow-up with his psychiatric providers.   Daleen Bo, MD 12/16/20 2018

## 2020-12-16 NOTE — Progress Notes (Signed)
Code Stroke Time Documentation   2008 Call Time 2011 Beeper Time 2011 Exam Started  2012 Exam Finished 2012 Images sent to [facility] 2012 Exam completed in Epic  2013 [facility] radiology called

## 2020-12-16 NOTE — ED Triage Notes (Signed)
Pt brought in by EMS for code stroke. LKN about 45 mins PTA. Pt had witnessed seizure with family, when EMS arrived pt noted to have right sided weakness. EDP cleared pt for triage on arrival.

## 2020-12-16 NOTE — Consult Note (Signed)
[facility] Tele[facility]urology Consult Services   Date of Service:   12/16/2020 20:15:40  Diagnosis:     .  I63.9 - Cerebrovascular accident (CVA), unspecified mechanism ([place])  Impression:     . 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 Plan to transfer for thrombectomy evaluation. He was not a candidate for tPA due to subacute appearing infarct on CT Head and risk of hemorrhage.  Metrics: Last Known Well: 12/16/2020 19:00:00 [facility] Notification Time: 12/16/2020 20:15:40 Arrival Time: 12/16/2020 20:04:00 Stamp Time: 12/16/2020 20:15:40 Initial Response Time: 12/16/2020 20:18:00 Symptoms: R sided weakness, aphasia. NIHSS Start Assessment Time: 12/16/2020 20:19:45 Patient is not a candidate for Thrombolytic. Thrombolytic Medical Decision: 12/16/2020 20:31:07 Patient was not deemed candidate for Thrombolytic because of following reasons: CT Demonstrating Extensive Infarction.  CT head was reviewed and results were: Subacute appearing multifocal infarcts within the L MCA territory  ED Physician notified of diagnostic impression and management plan on 12/16/2020 21:05:07  Advanced Imaging: CTA Head and Neck Completed.  CTP Completed.  LVO:Yes  Discussed with NIR:No  Discussed with NIR Text:  ED to discuss with NIR   Our recommendations are outlined below.  Recommendations:      .  Stroke/Telemetry Floor     .  Neuro Checks     .  Bedside Swallow Eval     .  DVT Prophylaxis     .  IV Fluids, Normal Saline     .  Head of Bed 30 Degrees     .  Euglycemia and Avoid Hyperthermia (PRN Acetaminophen)  Routine Consultation with [facility] for Follow up Care  Sign Out:     .  Discussed with Emergency Department  Provider    ------------------------------------------------------------------------------  History of Present Illness: Patient is a 55 year old Male.  Patient was brought by EMS for symptoms of R sided weakness, aphasia.  Patient presenting from home. History obtained per EMS. At ~1900, patient had a witnessed seizure at home. Following the seizure, he developed R sided weakness and aphasia. No prior history of postictal paralysis with his seizures in the past. Per chart review, he was seen in the ED yesterday with breakthrough seizure and reportedly was feeling weak and "weird" on 5/16  Last seen normal was within 4.5 hours. There is no history of hemorrhagic complications or intracranial hemorrhage. There is no history of Recent Anticoagulants. There is no history of recent major surgery. There is history of recent stroke.  Past Medical History:     . Hypertension     . Diabetes Mellitus     . Hyperlipidemia     . Coronary Artery Disease     . epilepsy  Anticoagulant use:  No  Antiplatelet use: Yes Plavix  Allergies:  Reviewed     Examination: BP(193/108), Pulse(90), Blood Glucose(165) 1A: Level of Consciousness - Alert; keenly responsive + 0 1B: Ask Month and Age - Aphasic + 2 1C: Blink Eyes & Squeeze Hands - Performs 0 Tasks + 2 2: Test Horizontal Extraocular Movements - Partial Gaze Palsy: Can Be Overcome + 1 3: Test Visual Fields - No Visual Loss + 0 4: Test Facial Palsy (Use Grimace if Obtunded) - Normal symmetry + 0 5A: Test Left Arm Motor Drift - No Drift for  10 Seconds + 0 5B: Test Right Arm Motor Drift - No Effort Against Gravity + 3 6A: Test Left Leg Motor Drift - No Drift for 5 Seconds + 0 6B: Test Right Leg Motor Drift - No Effort Against Gravity + 3 7: Test Limb Ataxia (FNF/Heel-Shin) - No Ataxia + 0 8: Test Sensation - Normal; No sensory loss + 0 9: Test Language/Aphasia - Mute/Global Aphasia: No Usable Speech/Auditory Comprehension + 3 10: Test  Dysarthria - Mute/Anarthric + 2 11: Test Extinction/Inattention - No abnormality + 0  NIHSS Score: 16   Pre-Morbid Modified Rankin Scale: 0 Points = No symptoms at all   Patient/Family was informed the Neurology Consult would occur via TeleHealth consult by way of interactive audio and video telecommunications and consented to receiving care in this manner.   Patient is being evaluated for possible acute neurologic impairment and high probability of imminent or life-threatening deterioration. I spent total of 45 minutes providing care to this patient, including time for face to face visit via telemedicine, review of medical records, imaging studies and discussion of findings with providers, the patient and/or family.   Dr Janeece Agee   [facility] [phone]  Case [id number]

## 2020-12-16 NOTE — H&P (Signed)
Neurology Consultation Reason for Consult: Code IR activation Requesting Physician: Daleen Bo  CC: Right sided weakness and speech difficulties  History is obtained from: Family (sister and father) and chart review given patient is aphasic   HPI: Melvin Taylor is a 55 y.o. male with a past medical history significant for partial symptomatic epilepsy with complex partial seizures (well controlled when on medications), coronary artery disease s/p CABG (2017), type 2 diabetes, hypertension, hyperlipidemia, sleep apnea, polysubstance abuse including (cocaine, tobacco, marijuana), mood disorder (bipolar/schizophrenia/anxiety/depression with hallucinations), chronic pain (back/chest wall/neck), gout.  Sister notes that the patient has an aide who comes in 6 days a week to help him with medication adherence but she has been on vacation.  In that setting the patient had a breakthrough seizure on Monday which he attributed to perhaps missing some medications in discussion with her.  In ED notes he presented complaining of a headache which improved with blood pressure control and pain medications, and a head CT was negative for acute intracranial process.  He presented again on Tuesday due to loss of consciousness which he suspected was a seizure as his headache was recurrent.  Depakote level was found to be 69 and his dose was increased from 1500 twice daily to 1750 twice daily with a supplemental load of 500 mg IV in the ED. family was concerned he also had slurred speech on Tuesday although they noted that this did fully resolve and that he was his normal self today when he left on a fishing trip with his father.  While out he was startled by a snake, and this sort of stressor can sometimes be a seizure trigger for him.  This happened at about 330 or 4 PM and afterwards family noticed his speech was abnormal and he was weak on the right side.  EMS was activated he was brought to [name], ED and  teleneurology evaluation revealed NIH stroke scale of 14, CTA with left M1 occlusion, CT perfusion with 160 cc at risk (slightly overestimated given dry head CT showed hypodensity that was included as area at risk in RAPID analysis).  Case was discussed with me by telemetry neurologist and patient was accepted to [facility] for emergent thrombectomy.  Risks and benefits of thrombectomy were discussed with family including high risk of permanent disability and death if no intervention was completed and 10% risk of hemorrhage with intervention, risk of the procedure being unsuccessful, and potential benefit of far improved recovery.  All of the sister's questions were answered and she was identified as Marine scientist by the father.  She consented to the procedure.  Regarding his seizure history, seizures started after a concussive head injury and he is followed by Dr. Stark Klein and was last seen 06/02/2020 at which time he was noted to have had a breakthrough seizure 05/10/2020 in the setting of undetectable valproic acid level.  Some of the seizures start with morning of losing hearing seeing flashes of light followed by generalized convulsions, stiffness/shaking, tongue biting, incontinence Home seizure medications include Depakote 1500 mg twice daily  Regarding his mood disorder, home medication list includes takes Valium 5 mg twice daily as needed anxiety, as well as lamotrigine 50 mg twice daily and risperidone 3 mg nightly, paliperidone 24-hour release 12 mg daily, Lexapro 20 mg daily.  However based on recent fill history as accessed by pharmacist on-call, he has been feeling olanzapine 5 mg twice daily and has not filled paliperidone recently  LKW: 3:30 PM reported by  family to me, 7 PM reported by EMS to teleneurology initially tPA given?: No, due to hypodensity suggestive of last known well greater than 6 hours prior to scan Premorbid modified rankin scale:      0 - No symptoms.  ROS: Unable to  obtain due to altered mental status.   Past Medical History:  Diagnosis Date  . Anxiety   . Bipolar 1 disorder ([place])   . Bone spur    left heel  . Cervical radiculopathy   . Chest pain   . Chronic back pain   . Chronic chest wall pain   . Chronic neck pain   . Coronary artery disease    a. s/p CABG in 03/2016 with LIMA-LAD, SVG-D1, SVG-RCA, and Seq SVG-mid and distal OM  . Depression   . Diabetes mellitus    Type II  . Hallucinations    "long history of them"  . Headache(784.0)   . History of gout   . HTN (hypertension)   . Insomnia   . Myocardial infarction ([place]) 2017  . Neuropathy   . Pain management   . Polysubstance abuse ([place])   . Right leg pain    chronic  . Schizophrenia ([place])   . Seizures ([place])    last sz between July 5-9th, 2016; epilepsy  . Shortness of breath dyspnea    with exertion  . Sleep apnea   . Transfusion of blood product refused for religious reason    Past Surgical History:  Procedure Laterality Date  . ANTERIOR CERVICAL DECOMP/DISCECTOMY FUSION N/A 12/14/2015   Procedure: ANTERIOR CERVICAL DECOMPRESSION/DISCECTOMY FUSION CERVICAL FIVE -SIX;  Surgeon: Earnie Larsson, MD;  Location: [facility];  Service: Neurosurgery;  Laterality: N/A;  . BIOPSY  05/07/2015   Procedure: BIOPSY (Gastric);  Surgeon: [name], MD;  Location: [facility];  Service: Endoscopy;;  . CARDIAC CATHETERIZATION N/A 03/07/2016   Procedure: Left Heart Cath and Coronary Angiography;  Surgeon: Belva Crome, MD;  Location: [facility];  Service: Cardiovascular;  Laterality: N/A;  . COLONOSCOPY WITH PROPOFOL N/A 05/07/2015   MVH:QIONGEXBMW diverticulosis, multiple colon polyps removed, tubular adenoma, serrated colon polyp. Next colonoscopy October 2019  . COLONOSCOPY WITH PROPOFOL N/A 08/02/2018   Procedure: COLONOSCOPY WITH PROPOFOL;  Surgeon: [name], MD;  Location: [facility];  Service: Endoscopy;  Laterality: N/A;  12:00pm  . CORONARY ARTERY BYPASS GRAFT N/A  03/28/2016   Procedure: CORONARY ARTERY BYPASS GRAFTING (CABG) x 5 USING GREATER SAPHENOUS VEIN;  Surgeon: [name], MD;  Location: [facility];  Service: Open Heart Surgery;  Laterality: N/A;  . ENDOVEIN HARVEST OF GREATER SAPHENOUS VEIN Right 03/28/2016   Procedure: ENDOVEIN HARVEST OF GREATER SAPHENOUS VEIN;  Surgeon: [name], MD;  Location: [facility];  Service: Open Heart Surgery;  Laterality: Right;  . ESOPHAGEAL DILATION N/A 05/07/2015   Procedure: ESOPHAGEAL DILATION WITH 56FR MALONEY DILATOR;  Surgeon: [name], MD;  Location: [facility];  Service: Endoscopy;  Laterality: N/A;  . ESOPHAGOGASTRODUODENOSCOPY (EGD) WITH PROPOFOL N/A 05/07/2015   RMR: Status post dilation of normal esophagus. Gastritis.  Marland Kitchen KNEE SURGERY Left    arthroscopy  . MANDIBLE FRACTURE SURGERY    . POLYPECTOMY  05/07/2015   Procedure: POLYPECTOMY (Hepatic Flexure, Distal Transverse Colon, Rectal);  Surgeon: [name], MD;  Location: [facility];  Service: Endoscopy;;  . TEE WITHOUT CARDIOVERSION N/A 03/28/2016   Procedure: TRANSESOPHAGEAL ECHOCARDIOGRAM (TEE);  Surgeon: [name], MD;  Location: [facility];  Service: Open Heart Surgery;  Laterality: N/A;  . TONSILLECTOMY     Current Outpatient Medications  Medication Instructions  . albuterol (PROVENTIL HFA;VENTOLIN HFA) 108 (90 Base) MCG/ACT inhaler 2 puffs, Inhalation, Every 4 hours PRN  . amLODipine (NORVASC) 10 mg, Oral, Daily  . atorvastatin (LIPITOR) 40 mg, Oral, Daily, Take 1 tablet at bedtime.  . baclofen (LIORESAL) 10 mg, Oral, 2 times daily  . clopidogrel (PLAVIX) 75 mg, Oral, Daily  . diazepam (VALIUM) 5 mg, Oral, 2 times daily PRN  . divalproex (DEPAKOTE) 1,500 mg, Oral, 2 times daily  . escitalopram (LEXAPRO) 20 mg, Oral, Daily  . ibuprofen (ADVIL) 800 mg, Oral, Every 6 hours PRN  . insulin aspart (NOVOLOG) 4-10 Units, Subcutaneous, 3 times daily with meals  . isosorbide mononitrate (IMDUR) 30 mg, Oral, Daily  . lamoTRIgine (LAMICTAL) 50 mg, Oral,  2 times daily  . Lantus SoloStar 40 Units, Subcutaneous, Daily at bedtime  . lisinopril (ZESTRIL) 20 MG tablet TAKE ONE TABLET BY MOUTH ONCE DAILY.  . Melatonin 10 MG CAPS 1 capsule, Oral, At bedtime PRN  . nitroGLYCERIN (NITROSTAT) 0.4 mg, Sublingual, Every 5 min PRN  . oxyCODONE-acetaminophen (PERCOCET) 5-325 MG tablet 1-2 tablets, Oral, Every 4 hours PRN  . paliperidone (INVEGA) 12 mg, Oral, Daily  . prazosin (MINIPRESS) 2-4 mg, Oral, At bedtime PRN  . ranolazine (RANEXA) 500 mg, Oral, 2 times daily  . risperiDONE (RISPERDAL) 3 mg, Oral, Daily at bedtime  Please note Per pharmacist review of his fill history he is only olanzapine 5 mg twice daily (not on the above list), Lexapro 20 mg daily, Lisinopril 20 daily, and Valium 5 mg TIDPRN, as well as the Lamictal 50 BID and Depakote 1500 BID isosorbide mononitrate (was last filled in March, unclear dose)  Family History  Problem Relation Age of Onset  . Diabetes Father   . Hypertension Father   . Arthritis Other   . Diabetes Other   . Asthma Other   . Stroke Sister    Social History:  reports that he has been smoking cigarettes. He has a 15.00 pack-year smoking history. He has never used smokeless tobacco. He reports current drug use. Drug: Marijuana. He reports that he does not drink alcohol.   Exam: Current vital signs: BP (!) 175/94   Pulse 63   Temp 97.8 F (36.6 C) (Axillary)   Resp 13   Ht 5\' 7"  (1.702 m)   Wt 71.2 kg   SpO2 100%   BMI 24.59 kg/m  Vital signs in last 24 hours: Temp:  [97.8 F (36.6 C)] 97.8 F (36.6 C) (05/18 2024) Pulse Rate:  [62-63] 63 (05/18 2133) Resp:  [13-18] 13 (05/18 2133) BP: (175-193)/(94-108) 175/94 (05/18 2133) SpO2:  [99 %-100 %] 100 % (05/18 2133) Weight:  [71.2 kg] 71.2 kg (05/18 2022)   Physical Exam  Constitutional: Appears well-developed and well-nourished.  Psych: Affect appropriate to situation, social smile present Eyes: No scleral injection HENT: No oropharyngeal  obstruction.  MSK: no joint deformities.  Cardiovascular: Normal rate and regular rhythm.  Respiratory: Effort normal, non-labored breathing GI: Soft.  No distension. There is no tenderness.  Skin: Warm dry and intact visible skin  Neuro: Mental Status: Patient is awake, alert, but mute and neglecting the right side Cranial Nerves: II: Visual Fields are notable for a right hemianopia. Pupils are equal, round, and reactive to light.   III,IV, VI: EOM with left gaze preference but can cross fully to the right V: Facial sensation is  symmetric to eyelash brush VII: Facial movement is notable for right facial droop.  VIII: hearing is intact to voice Remainder unable to assess secondary to mental status Motor: Tone is normal. Bulk is normal.  Briskly withdraws from noxious stimulation of the right upper extremity, right lower extremity drifts slightly but does not hit the bed with encouragement, left upper extremity has no drift, left lower extremity has no drift Sensory: Grossly equally reactive to touch in all 4 extremities Cerebellar: Unable to assess secondary to patient's mental status   NIHSS total 18 Score breakdown: 2 points for not answering questions, 2 points for not following commands, one-point for gaze preference to the left, 2 points for right hemianopia, 2 points for right facial droop, 2 points for right arm weakness, one-point for right leg weakness, 3 points for being mute, 2 points for no intelligible words, one-point for mild right-sided neglect    I have reviewed labs in epic and the results pertinent to this consultation are:  Basic Metabolic Panel: Recent Labs  Lab 12/12/20 1123 12/15/20 0929 12/15/20 1341 12/16/20 2028  NA 138 137  --  135  K 5.0 4.6  --  3.7  CL 104 103  --  102  CO2 29 28  --  27  GLUCOSE 134* 157*  --  175*  BUN 17 14  --  22*  CREATININE 1.43* 1.33*  --  2.15*  CALCIUM 8.7* 8.2*  --  7.7*  MG  --   --  1.7  --     CBC: Recent  Labs  Lab 12/12/20 1123 12/15/20 0929 12/16/20 2028  WBC 5.2 3.8* 4.2  NEUTROABS 2.3 1.2* 1.5*  HGB 13.2 12.8* 12.0*  HCT 38.4* 37.3* 34.5*  MCV 91.6 90.5 91.0  PLT 200 213 183    Coagulation Studies: Recent Labs    12/16/20 2026/11/15  LABPROT 13.1  INR 1.0      I have reviewed the images obtained: Head CT with subacute hypodensity in the right frontal lobe, as well as scattered more advanced hypodensities that do appear largely stable from his head CT from 5/14 as well, perhaps slightly more conspicuous.  Aspects 9 for M1 area of hypodensity CTA with left M1 occlusion CT perfusion with significant area at risk   Impression: Acute ischemic stroke in the setting of multiple stroke risk factors as above. Patient is a good candidate for intervention given his baseline functional status, young age and large vessel occlusion with favorable perfusion imaging and ASPECTS score.\  He is now status post successful thrombectomy, taking 3 with 2 passes with retriever, though he required a stent due to underlying intracranial atherosclerosis suggesting stroke mechanism is intracranial atherosclerotic disease; cocaine induced vasospasm/vasculopathy may also be playing a role given his history (UDS pending).  Additionally cardioembolic event cannot be ruled out at this time.   Recommendations: Assessment:  Plan:  Acute Ischemic Stroke Cerebral infarction due to embolism of left middle cerebral artery   Acuity: Acute Current Suspected Etiology: embolic  -Admit to: ICU -Antiplatelets per IR team post procedure  -Continue Statin -Blood pressure control, goal per IR team post procedure, SBP 120-140 if successful recannalization -MRI/ECHO/A1C/Lipid panel. -Hyperglycemia management per SSI to maintain glucose 140-180mg /dL. -PT/OT/ST therapies and recommendations when able  CNS  Dysarthria Dysphagia following cerebral infarction  -NPO until cleared by speech -ST -Advance diet as  tolerated -May need PEG  Hemiplegia and hemiparesis following cerebral infarction affecting right dominant side -PT/OT -PM&R consult  Baseline mood  disorder -Hold medications until medication reconciliation is complete, likely is only on olanzapine 5 mg twice daily  RESP Intubated for procedure  -vent management per ICU -wean when able  CV Essential (primary) hypertension -Aggressive BP control, goal SBP as above -Titrate oral agents, hold home lisinopril for now given AKI, consider amlodipine or Imdur as these are on his home medication list -TTE  CAD -Home plavix, antiplatelet agents per neuro IR team given intervention  Hyperlipidemia, unspecified  - Statin for goal LDL < 70  HEME Mild normocytic Anemia -Monitor -transfuse for hgb < 7  ENDO Type 2 diabetes mellitus w/o complications. -SSI -Start oral meds when cleared by speech -goal HgbA1c < 7  GI/GU Acute Kidney Failure (Cr 2.15, baseline 1.33) -Gentle hydration -avoid nephrotoxic agents - continue to monitor   Fluid/Electrolyte Disorders Hypocalciemia (7.7, corrects to 8.7 given Albumin 2.8) -Repeat labs -Trend  ID Possible Aspiration PNA -CXR -NPO -Monitor  Possible UTI -UA pending  Nutrition - No active issues   Prophylaxis DVT: Hold pending completion of ticagrelor, may be started if repeat head imaging is stable  GI: Protonix Bowel: Senna  Diet: NPO until cleared by speech  Code Status: Full Code    THE FOLLOWING WERE PRESENT ON ADMISSION: CNS -  Acute Ischemic Stroke, Seizures,  Hemiparesis, Hemiplegia,  Respiratory - Possible Aspiration Pneumonia,  Renal -  Acute Kidney Injury   Lesleigh Noe MD-PhD [facility] [phone]  Total critical care time: 50 minutes   Critical care time was exclusive of separately billable procedures and treating other patients.   Critical care was necessary to treat or prevent imminent or life-threatening deterioration.    Critical care was time spent personally by me on the following activities: development of treatment plan with patient and/or surrogate as well as nursing, discussions with consultants/primary team, evaluation of patient's response to treatment, examination of patient, obtaining history from patient or surrogate, ordering and performing treatments and interventions, ordering and review of laboratory studies, ordering and review of radiographic studies, and re-evaluation of patient's condition as needed, as documented above.

## 2020-12-16 NOTE — Anesthesia Preprocedure Evaluation (Addendum)
Anesthesia Evaluation  Patient identified by MRN, date of birth, ID band Patient confused    Reviewed: Allergy & Precautions, Patient's Chart, lab work & pertinent test results, Unable to perform ROS - Chart review onlyPreop documentation limited or incomplete due to emergent nature of procedure.  History of Anesthesia Complications Negative for: history of anesthetic complications  Airway   TM Distance: >3 FB Neck ROM: Full   Comment:  Unable to perform exam due to patient mental status  Dental  (+) Poor Dentition   Pulmonary sleep apnea , Current Smoker,    Pulmonary exam normal        Cardiovascular hypertension, Pt. on medications + CAD, + Past MI and + CABG  Normal cardiovascular exam   '20 TTE - EF 60-65%. Mild to moderate concentric LVH. Right atrial size was mildly dilated. Mild MR, TR, and PR.    Neuro/Psych  Headaches, Seizures -,  PSYCHIATRIC DISORDERS Anxiety Depression Bipolar Disorder Schizophrenia CVA (aphasic)    GI/Hepatic (+)     substance abuse  ,   Endo/Other  diabetes, Insulin Dependent  Renal/GU CRFRenal disease     Musculoskeletal  Gout    Abdominal   Peds  Hematology  (+) anemia , REFUSES BLOOD PRODUCTS, JEHOVAH'S WITNESS  Anesthesia Other Findings Covid test negative   Reproductive/Obstetrics                            Anesthesia Physical Anesthesia Plan  ASA: IV and emergent  Anesthesia Plan: General   Post-op Pain Management:    Induction: Intravenous, Rapid sequence and Cricoid pressure planned  PONV Risk Score and Plan: 1 and Treatment may vary due to age or medical condition  Airway Management Planned: Oral ETT  Additional Equipment: Arterial line  Intra-op Plan:   Post-operative Plan: Post-operative intubation/ventilation  Informed Consent:     Only emergency history available and History available from chart only  Plan Discussed  with: CRNA and Anesthesiologist  Anesthesia Plan Comments:        Anesthesia Quick Evaluation

## 2020-12-17 ENCOUNTER — Encounter (HOSPITAL_COMMUNITY): Payer: Self-pay | Admitting: Interventional Radiology

## 2020-12-17 ENCOUNTER — Inpatient Hospital Stay (HOSPITAL_COMMUNITY): Payer: Medicaid Other

## 2020-12-17 DIAGNOSIS — E1129 Type 2 diabetes mellitus with other diabetic kidney complication: Secondary | ICD-10-CM

## 2020-12-17 DIAGNOSIS — I10 Essential (primary) hypertension: Secondary | ICD-10-CM | POA: Diagnosis not present

## 2020-12-17 DIAGNOSIS — E1159 Type 2 diabetes mellitus with other circulatory complications: Secondary | ICD-10-CM

## 2020-12-17 DIAGNOSIS — I6602 Occlusion and stenosis of left middle cerebral artery: Secondary | ICD-10-CM

## 2020-12-17 DIAGNOSIS — Z01818 Encounter for other preprocedural examination: Secondary | ICD-10-CM

## 2020-12-17 DIAGNOSIS — Z794 Long term (current) use of insulin: Secondary | ICD-10-CM

## 2020-12-17 DIAGNOSIS — I63512 Cerebral infarction due to unspecified occlusion or stenosis of left middle cerebral artery: Secondary | ICD-10-CM | POA: Diagnosis not present

## 2020-12-17 DIAGNOSIS — N289 Disorder of kidney and ureter, unspecified: Secondary | ICD-10-CM

## 2020-12-17 HISTORY — PX: IR INTRA CRAN STENT: IMG2345

## 2020-12-17 HISTORY — PX: IR PERCUTANEOUS ART THROMBECTOMY/INFUSION INTRACRANIAL INC DIAG ANGIO: IMG6087

## 2020-12-17 HISTORY — PX: IR CT HEAD LTD: IMG2386

## 2020-12-17 LAB — URINALYSIS, ROUTINE W REFLEX MICROSCOPIC
Bacteria, UA: NONE SEEN
Bilirubin Urine: NEGATIVE
Glucose, UA: 50 mg/dL — AB
Ketones, ur: 5 mg/dL — AB
Leukocytes,Ua: NEGATIVE
Nitrite: NEGATIVE
Protein, ur: 100 mg/dL — AB
Specific Gravity, Urine: 1.038 — ABNORMAL HIGH (ref 1.005–1.030)
pH: 6 (ref 5.0–8.0)

## 2020-12-17 LAB — RAPID URINE DRUG SCREEN, HOSP PERFORMED
Amphetamines: NOT DETECTED
Barbiturates: NOT DETECTED
Benzodiazepines: NOT DETECTED
Cocaine: NOT DETECTED
Opiates: NOT DETECTED
Tetrahydrocannabinol: POSITIVE — AB

## 2020-12-17 LAB — CBC WITH DIFFERENTIAL/PLATELET
Abs Immature Granulocytes: 0.02 10*3/uL (ref 0.00–0.07)
Basophils Absolute: 0 10*3/uL (ref 0.0–0.1)
Basophils Relative: 0 %
Eosinophils Absolute: 0 10*3/uL (ref 0.0–0.5)
Eosinophils Relative: 1 %
HCT: 32.8 % — ABNORMAL LOW (ref 39.0–52.0)
Hemoglobin: 11.3 g/dL — ABNORMAL LOW (ref 13.0–17.0)
Immature Granulocytes: 0 %
Lymphocytes Relative: 17 %
Lymphs Abs: 1 10*3/uL (ref 0.7–4.0)
MCH: 31 pg (ref 26.0–34.0)
MCHC: 34.5 g/dL (ref 30.0–36.0)
MCV: 90.1 fL (ref 80.0–100.0)
Monocytes Absolute: 0.1 10*3/uL (ref 0.1–1.0)
Monocytes Relative: 2 %
Neutro Abs: 4.5 10*3/uL (ref 1.7–7.7)
Neutrophils Relative %: 80 %
Platelets: 173 10*3/uL (ref 150–400)
RBC: 3.64 MIL/uL — ABNORMAL LOW (ref 4.22–5.81)
RDW: 12.3 % (ref 11.5–15.5)
WBC: 5.7 10*3/uL (ref 4.0–10.5)
nRBC: 0 % (ref 0.0–0.2)

## 2020-12-17 LAB — BASIC METABOLIC PANEL
Anion gap: 8 (ref 5–15)
BUN: 18 mg/dL (ref 6–20)
CO2: 17 mmol/L — ABNORMAL LOW (ref 22–32)
Calcium: 7.2 mg/dL — ABNORMAL LOW (ref 8.9–10.3)
Chloride: 111 mmol/L (ref 98–111)
Creatinine, Ser: 1.75 mg/dL — ABNORMAL HIGH (ref 0.61–1.24)
GFR, Estimated: 45 mL/min — ABNORMAL LOW (ref >60)
Glucose, Bld: 235 mg/dL — ABNORMAL HIGH (ref 70–99)
Potassium: 3.7 mmol/L (ref 3.5–5.1)
Sodium: 136 mmol/L (ref 135–145)

## 2020-12-17 LAB — POCT I-STAT 7, (LYTES, BLD GAS, ICA,H+H)
Acid-base deficit: 7 mmol/L — ABNORMAL HIGH (ref 0.0–2.0)
Bicarbonate: 17.3 mmol/L — ABNORMAL LOW (ref 20.0–28.0)
Calcium, Ion: 1.1 mmol/L — ABNORMAL LOW (ref 1.15–1.40)
HCT: 35 % — ABNORMAL LOW (ref 39.0–52.0)
Hemoglobin: 11.9 g/dL — ABNORMAL LOW (ref 13.0–17.0)
O2 Saturation: 99 %
Patient temperature: 96
Potassium: 3.5 mmol/L (ref 3.5–5.1)
Sodium: 139 mmol/L (ref 135–145)
TCO2: 18 mmol/L — ABNORMAL LOW (ref 22–32)
pCO2 arterial: 27.4 mmHg — ABNORMAL LOW (ref 32.0–48.0)
pH, Arterial: 7.402 (ref 7.350–7.450)
pO2, Arterial: 107 mmHg (ref 83.0–108.0)

## 2020-12-17 LAB — GLUCOSE, CAPILLARY
Glucose-Capillary: 108 mg/dL — ABNORMAL HIGH (ref 70–99)
Glucose-Capillary: 119 mg/dL — ABNORMAL HIGH (ref 70–99)
Glucose-Capillary: 150 mg/dL — ABNORMAL HIGH (ref 70–99)
Glucose-Capillary: 155 mg/dL — ABNORMAL HIGH (ref 70–99)
Glucose-Capillary: 213 mg/dL — ABNORMAL HIGH (ref 70–99)
Glucose-Capillary: 227 mg/dL — ABNORMAL HIGH (ref 70–99)

## 2020-12-17 LAB — ECHOCARDIOGRAM COMPLETE
AR max vel: 3.25 cm2
AV Area VTI: 3.55 cm2
AV Area mean vel: 3.28 cm2
AV Mean grad: 3 mmHg
AV Peak grad: 6.3 mmHg
Ao pk vel: 1.25 m/s
Area-P 1/2: 2.76 cm2
Height: 67 in
MV VTI: 2.07 cm2
S' Lateral: 1.7 cm
Weight: 2512.01 oz

## 2020-12-17 LAB — VALPROIC ACID LEVEL: Valproic Acid Lvl: 39 ug/mL — ABNORMAL LOW (ref 50.0–100.0)

## 2020-12-17 LAB — LIPID PANEL
Cholesterol: 224 mg/dL — ABNORMAL HIGH (ref 0–200)
HDL: 79 mg/dL (ref >40)
LDL Cholesterol: 116 mg/dL — ABNORMAL HIGH (ref 0–99)
Total CHOL/HDL Ratio: 2.8 RATIO
Triglycerides: 146 mg/dL (ref <150)
VLDL: 29 mg/dL (ref 0–40)

## 2020-12-17 LAB — MRSA PCR SCREENING: MRSA by PCR: NEGATIVE

## 2020-12-17 LAB — MAGNESIUM: Magnesium: 1.6 mg/dL — ABNORMAL LOW (ref 1.7–2.4)

## 2020-12-17 LAB — HEMOGLOBIN A1C
Hgb A1c MFr Bld: 9.7 % — ABNORMAL HIGH (ref 4.8–5.6)
Mean Plasma Glucose: 231.69 mg/dL

## 2020-12-17 LAB — HIV ANTIBODY (ROUTINE TESTING W REFLEX): HIV Screen 4th Generation wRfx: NONREACTIVE

## 2020-12-17 MED ORDER — ENOXAPARIN SODIUM 40 MG/0.4ML IJ SOSY
40.0000 mg | PREFILLED_SYRINGE | INTRAMUSCULAR | Status: DC
  Administered 2020-12-17 – 2020-12-21 (×5): 40 mg via SUBCUTANEOUS
  Filled 2020-12-17 (×5): qty 0.4

## 2020-12-17 MED ORDER — ORAL CARE MOUTH RINSE
15.0000 mL | OROMUCOSAL | Status: DC
  Administered 2020-12-17 (×5): 15 mL via OROMUCOSAL

## 2020-12-17 MED ORDER — ATORVASTATIN CALCIUM 80 MG PO TABS: 80.0000 mg | ORAL_TABLET | Freq: Every day | ORAL | Status: DC

## 2020-12-17 MED ORDER — VALPROATE SODIUM 100 MG/ML IV SOLN
750.0000 mg | Freq: Four times a day (QID) | INTRAVENOUS | Status: DC
  Administered 2020-12-17 (×3): 750 mg via INTRAVENOUS
  Filled 2020-12-17 (×4): qty 7.5

## 2020-12-17 MED ORDER — DOCUSATE SODIUM 50 MG/5ML PO LIQD
100.0000 mg | Freq: Two times a day (BID) | ORAL | Status: DC
  Administered 2020-12-17: 100 mg
  Filled 2020-12-17: qty 10

## 2020-12-17 MED ORDER — DIVALPROEX SODIUM 500 MG PO DR TAB
1500.0000 mg | DELAYED_RELEASE_TABLET | Freq: Two times a day (BID) | ORAL | Status: DC
  Administered 2020-12-17: 1500 mg via ORAL
  Filled 2020-12-17 (×2): qty 3

## 2020-12-17 MED ORDER — AMLODIPINE BESYLATE 10 MG PO TABS
10.0000 mg | ORAL_TABLET | Freq: Every day | ORAL | Status: DC
  Administered 2020-12-17 – 2020-12-21 (×5): 10 mg via ORAL
  Filled 2020-12-17 (×5): qty 1

## 2020-12-17 MED ORDER — CHLORHEXIDINE GLUCONATE CLOTH 2 % EX PADS
6.0000 | MEDICATED_PAD | Freq: Every day | CUTANEOUS | Status: DC
  Administered 2020-12-18: 6 via TOPICAL

## 2020-12-17 MED ORDER — IOHEXOL 300 MG/ML  SOLN
100.0000 mL | Freq: Once | INTRAMUSCULAR | Status: AC | PRN
  Administered 2020-12-17: 60 mL via INTRA_ARTERIAL

## 2020-12-17 MED ORDER — LAMOTRIGINE 25 MG PO TABS: 50.0000 mg | ORAL_TABLET | Freq: Two times a day (BID) | ORAL | Status: DC

## 2020-12-17 MED ORDER — HYDRALAZINE HCL 20 MG/ML IJ SOLN: 5.0000 mg | INTRAMUSCULAR | Status: DC | PRN

## 2020-12-17 MED ORDER — ATORVASTATIN CALCIUM 40 MG PO TABS: 40.0000 mg | ORAL_TABLET | Freq: Every day | ORAL | Status: DC

## 2020-12-17 MED ORDER — TICAGRELOR 90 MG PO TABS: 90.0000 mg | ORAL_TABLET | Freq: Two times a day (BID) | ORAL | Status: DC

## 2020-12-17 MED ORDER — SODIUM CHLORIDE 0.9 % IV SOLN: INTRAVENOUS | Status: DC

## 2020-12-17 MED ORDER — IOHEXOL 300 MG/ML  SOLN
50.0000 mL | Freq: Once | INTRAMUSCULAR | Status: AC | PRN
  Administered 2020-12-17: 30 mL via INTRA_ARTERIAL

## 2020-12-17 MED ORDER — TICAGRELOR 90 MG PO TABS
90.0000 mg | ORAL_TABLET | Freq: Two times a day (BID) | ORAL | Status: DC
  Administered 2020-12-17 – 2020-12-21 (×8): 90 mg via ORAL
  Filled 2020-12-17 (×7): qty 1

## 2020-12-17 MED ORDER — INSULIN DETEMIR 100 UNIT/ML ~~LOC~~ SOLN
10.0000 [IU] | Freq: Two times a day (BID) | SUBCUTANEOUS | Status: DC
  Administered 2020-12-17 (×2): 10 [IU] via SUBCUTANEOUS
  Filled 2020-12-17 (×4): qty 0.1

## 2020-12-17 MED ORDER — ESCITALOPRAM OXALATE 20 MG PO TABS
20.0000 mg | ORAL_TABLET | Freq: Every day | ORAL | Status: DC
  Administered 2020-12-17: 20 mg
  Filled 2020-12-17: qty 1
  Filled 2020-12-17: qty 2
  Filled 2020-12-17: qty 1

## 2020-12-17 MED ORDER — INSULIN ASPART 100 UNIT/ML IJ SOLN
0.0000 [IU] | INTRAMUSCULAR | Status: DC
  Administered 2020-12-17: 3 [IU] via SUBCUTANEOUS
  Administered 2020-12-17: 2 [IU] via SUBCUTANEOUS
  Administered 2020-12-17: 3 [IU] via SUBCUTANEOUS
  Administered 2020-12-18: 1 [IU] via SUBCUTANEOUS

## 2020-12-17 MED ORDER — ISOSORBIDE MONONITRATE ER 30 MG PO TB24
30.0000 mg | ORAL_TABLET | Freq: Every day | ORAL | Status: DC
  Administered 2020-12-17 – 2020-12-21 (×5): 30 mg via ORAL
  Filled 2020-12-17 (×5): qty 1

## 2020-12-17 MED ORDER — ATORVASTATIN CALCIUM 40 MG PO TABS
40.0000 mg | ORAL_TABLET | Freq: Every day | ORAL | Status: DC
  Administered 2020-12-17: 40 mg
  Filled 2020-12-17: qty 1

## 2020-12-17 MED ORDER — ACETAMINOPHEN 325 MG PO TABS: 650.0000 mg | ORAL_TABLET | ORAL | Status: DC | PRN

## 2020-12-17 MED ORDER — ESCITALOPRAM OXALATE 20 MG PO TABS
20.0000 mg | ORAL_TABLET | Freq: Every day | ORAL | Status: DC
  Filled 2020-12-17: qty 1

## 2020-12-17 MED ORDER — ACETAMINOPHEN 650 MG RE SUPP: 650.0000 mg | RECTAL | Status: DC | PRN

## 2020-12-17 MED ORDER — ENOXAPARIN SODIUM 40 MG/0.4ML IJ SOSY: 40.0000 mg | PREFILLED_SYRINGE | INTRAMUSCULAR | Status: DC

## 2020-12-17 MED ORDER — VALPROATE SODIUM 100 MG/ML IV SOLN
750.0000 mg | Freq: Four times a day (QID) | INTRAVENOUS | Status: AC
  Administered 2020-12-17: 750 mg via INTRAVENOUS
  Filled 2020-12-17: qty 7.5

## 2020-12-17 MED ORDER — MAGNESIUM SULFATE 4 GM/100ML IV SOLN
4.0000 g | Freq: Once | INTRAVENOUS | Status: AC
  Administered 2020-12-17: 4 g via INTRAVENOUS
  Filled 2020-12-17: qty 100

## 2020-12-17 MED ORDER — PROPOFOL 1000 MG/100ML IV EMUL
0.0000 ug/kg/min | INTRAVENOUS | Status: DC
  Administered 2020-12-17: 10 ug/kg/min via INTRAVENOUS
  Filled 2020-12-17: qty 100

## 2020-12-17 MED ORDER — HYDRALAZINE HCL 20 MG/ML IJ SOLN
5.0000 mg | INTRAMUSCULAR | Status: DC | PRN
  Administered 2020-12-17 – 2020-12-21 (×6): 10 mg via INTRAVENOUS
  Filled 2020-12-17 (×6): qty 1

## 2020-12-17 MED ORDER — BACLOFEN 5 MG HALF TABLET: 5.0000 mg | ORAL_TABLET | Freq: Two times a day (BID) | ORAL | Status: DC

## 2020-12-17 MED ORDER — SENNOSIDES-DOCUSATE SODIUM 8.6-50 MG PO TABS: 1.0000 | ORAL_TABLET | Freq: Every evening | ORAL | Status: DC | PRN

## 2020-12-17 MED ORDER — SODIUM CHLORIDE 0.9 % IV SOLN
INTRAVENOUS | Status: DC
Start: 2020-12-17 — End: 2020-12-21

## 2020-12-17 MED ORDER — STROKE: EARLY STAGES OF RECOVERY BOOK: Freq: Once | Status: DC

## 2020-12-17 MED ORDER — CHLORHEXIDINE GLUCONATE 0.12% ORAL RINSE (MEDLINE KIT)
15.0000 mL | Freq: Two times a day (BID) | OROMUCOSAL | Status: DC
  Administered 2020-12-17: 15 mL via OROMUCOSAL

## 2020-12-17 MED ORDER — PANTOPRAZOLE SODIUM 40 MG IV SOLR
40.0000 mg | Freq: Every day | INTRAVENOUS | Status: DC
  Administered 2020-12-17: 40 mg via INTRAVENOUS
  Filled 2020-12-17: qty 40

## 2020-12-17 MED ORDER — SODIUM CHLORIDE 0.9 % IV SOLN
2.0000 ug/kg/min | INTRAVENOUS | Status: AC
  Administered 2020-12-17: 2 ug/kg/min via INTRAVENOUS
  Filled 2020-12-17: qty 50

## 2020-12-17 MED ORDER — LAMOTRIGINE 25 MG PO TABS
50.0000 mg | ORAL_TABLET | Freq: Two times a day (BID) | ORAL | Status: DC
  Administered 2020-12-17 (×2): 50 mg
  Filled 2020-12-17 (×2): qty 2

## 2020-12-17 MED ORDER — MELATONIN 5 MG PO TABS: 10.0000 mg | ORAL_TABLET | Freq: Every evening | ORAL | Status: DC | PRN

## 2020-12-17 MED ORDER — POLYETHYLENE GLYCOL 3350 17 G PO PACK
17.0000 g | PACK | Freq: Every day | ORAL | Status: DC
  Administered 2020-12-17: 17 g
  Filled 2020-12-17: qty 1

## 2020-12-17 MED ORDER — PANTOPRAZOLE SODIUM 40 MG PO TBEC
40.0000 mg | DELAYED_RELEASE_TABLET | Freq: Every day | ORAL | Status: DC
  Administered 2020-12-17 – 2020-12-21 (×5): 40 mg via ORAL
  Filled 2020-12-17 (×5): qty 1

## 2020-12-17 MED ORDER — CLEVIDIPINE BUTYRATE 0.5 MG/ML IV EMUL
0.0000 mg/h | INTRAVENOUS | Status: DC
  Administered 2020-12-17: 2 mg/h via INTRAVENOUS
  Administered 2020-12-17: 8 mg/h via INTRAVENOUS
  Administered 2020-12-17: 4 mg/h via INTRAVENOUS
  Filled 2020-12-17: qty 50
  Filled 2020-12-17 (×2): qty 100

## 2020-12-17 MED ORDER — LAMOTRIGINE 25 MG PO TABS
50.0000 mg | ORAL_TABLET | Freq: Two times a day (BID) | ORAL | Status: DC
  Administered 2020-12-17: 50 mg
  Filled 2020-12-17: qty 2

## 2020-12-17 MED ORDER — FENTANYL CITRATE (PF) 100 MCG/2ML IJ SOLN
50.0000 ug | INTRAMUSCULAR | Status: DC | PRN
  Filled 2020-12-17: qty 2

## 2020-12-17 MED ORDER — FENTANYL CITRATE (PF) 100 MCG/2ML IJ SOLN
50.0000 ug | INTRAMUSCULAR | Status: DC | PRN
  Administered 2020-12-17 (×2): 100 ug via INTRAVENOUS
  Filled 2020-12-17 (×2): qty 2

## 2020-12-17 MED ORDER — BACLOFEN 5 MG HALF TABLET
5.0000 mg | ORAL_TABLET | Freq: Two times a day (BID) | ORAL | Status: DC
  Filled 2020-12-17: qty 1

## 2020-12-17 MED ORDER — RISPERIDONE 3 MG PO TABS: 3.0000 mg | ORAL_TABLET | Freq: Every day | ORAL | Status: DC

## 2020-12-17 MED ORDER — CANGRELOR BOLUS VIA INFUSION
15.0000 ug/kg | Freq: Once | INTRAVENOUS | Status: AC
  Administered 2020-12-17: 1068 ug via INTRAVENOUS
  Filled 2020-12-17: qty 1068

## 2020-12-17 MED ORDER — POTASSIUM CHLORIDE 20 MEQ PO PACK
40.0000 meq | PACK | Freq: Once | ORAL | Status: AC
  Administered 2020-12-17: 40 meq
  Filled 2020-12-17: qty 2

## 2020-12-17 MED ORDER — POLYETHYLENE GLYCOL 3350 17 G PO PACK: 17.0000 g | PACK | Freq: Every day | ORAL | Status: DC

## 2020-12-17 MED ORDER — ACETAMINOPHEN 160 MG/5ML PO SOLN
650.0000 mg | ORAL | Status: DC | PRN
Start: 2020-12-17 — End: 2020-12-21

## 2020-12-17 MED ORDER — DOCUSATE SODIUM 50 MG/5ML PO LIQD: 100.0000 mg | Freq: Two times a day (BID) | ORAL | Status: DC

## 2020-12-17 MED ORDER — LABETALOL HCL 5 MG/ML IV SOLN
INTRAVENOUS | Status: DC | PRN
  Administered 2020-12-17 (×2): 5 mg via INTRAVENOUS

## 2020-12-17 MED ORDER — TICAGRELOR 90 MG PO TABS
90.0000 mg | ORAL_TABLET | Freq: Two times a day (BID) | ORAL | Status: DC
  Administered 2020-12-17: 90 mg
  Filled 2020-12-17 (×4): qty 1

## 2020-12-17 MED ORDER — ALBUTEROL SULFATE (2.5 MG/3ML) 0.083% IN NEBU: 2.5000 mg | INHALATION_SOLUTION | RESPIRATORY_TRACT | Status: DC | PRN

## 2020-12-17 MED ORDER — ACETAMINOPHEN 160 MG/5ML PO SOLN: 650.0000 mg | ORAL | Status: DC | PRN

## 2020-12-17 MED ORDER — CLEVIDIPINE BUTYRATE 0.5 MG/ML IV EMUL
INTRAVENOUS | Status: DC | PRN
  Administered 2020-12-17: 2 mg/h via INTRAVENOUS

## 2020-12-17 NOTE — Procedures (Signed)
Patient Name: Melvin Taylor  MRN: [id number]  Epilepsy Attending: Lora Havens  Referring Physician/Provider: Dr. Rosalin Hawking Date: 12/17/2020 Duration: 23.59 mins  Patient history: 55 year old male presented to ED with witnessed seizure activity and subsequent right hemiplegia with aphasia and left gaze deviation.  EEG to evaluate for seizures.  Level of alertness: Awake  AEDs during EEG study: Lamotrigine, Depakote  Technical aspects: This EEG study was done with scalp electrodes positioned according to the 10-20 International system of electrode placement. Electrical activity was acquired at a sampling rate of 500Hz  and reviewed with a high frequency filter of 70Hz  and a low frequency filter of 1Hz . EEG data were recorded continuously and digitally stored.   Description: The posterior dominant rhythm consists of 8 Hz activity of moderate voltage (25-35 uV) seen predominantly in posterior head regions, symmetric and reactive to eye opening and eye closing. EEG showed intermittent 3 to 6 Hz theta-delta slowing in left hemisphere.  Photic driving was not seen during photic stimulation. Hyperventilation was not performed.     ABNORMALITY - Intermittent slow, left hemisphere  IMPRESSION: This study is suggestive of cortical dysfunction arising from left hemisphere, nonspecific etiology. No seizures or epileptiform discharges were seen throughout the recording.   [name]

## 2020-12-17 NOTE — TOC CAGE-AID Note (Signed)
Transition of Care [facility]) - CAGE-AID Screening   Patient Details  Name: Melvin Taylor MRN: [id number] Date of Birth: [birth year]  Transition of Care [facility] CM/SW Contact:    Benard Halsted, LCSW Phone Number: 12/17/2020, 3:38 PM   Clinical Narrative: Patient unable to participate in screening.    CAGE-AID Screening: Substance Abuse Screening unable to be completed due to: : Patient unable to participate

## 2020-12-17 NOTE — Progress Notes (Signed)
Referring Physician(s): Dr. Curly Shores  Supervising Physician: Luanne Bras  Patient Status:  [facility] - In-pt  Chief Complaint: L MCA occlusion  Subjective: Patient resting comfortably this AM.  Recently extubated.  Sisters at bedside.  Word finding difficulties, residual right-sided weakness.  Stable after procedure.   Allergies: Aspirin  Medications: Prior to Admission medications   Medication Sig Start Date End Date Taking? Authorizing Provider  albuterol (PROVENTIL HFA;VENTOLIN HFA) 108 (90 Base) MCG/ACT inhaler Inhale 2 puffs into the lungs every 4 (four) hours as needed for wheezing or shortness of breath.     [provider]  amLODipine (NORVASC) 10 MG tablet Take 1 tablet (10 mg total) by mouth daily. 04/14/20   Verta Ellen., NP  atorvastatin (LIPITOR) 40 MG tablet Take 40 mg by mouth daily. Take 1 tablet at bedtime.    [provider]  baclofen (LIORESAL) 10 MG tablet Take 10 mg by mouth 2 (two) times daily. 02/18/20   [provider]  clopidogrel (PLAVIX) 75 MG tablet Take 75 mg by mouth daily.    [provider]  cycloSPORINE (RESTASIS) 0.05 % ophthalmic emulsion Place 1 drop into both eyes 2 (two) times daily.    [provider]  diazepam (VALIUM) 5 MG tablet Take 5 mg by mouth 2 (two) times daily as needed for anxiety.     [provider]  divalproex (DEPAKOTE) 500 MG DR tablet Take 3 tablets (1,500 mg total) by mouth 2 (two) times daily. 06/02/20   Penumalli, Earlean Polka, MD  escitalopram (LEXAPRO) 20 MG tablet Take 20 mg by mouth daily.     [provider]  ibuprofen (ADVIL) 800 MG tablet Take 800 mg by mouth every 6 (six) hours as needed for moderate pain. 11/17/20   [provider]  insulin aspart (NOVOLOG) 100 UNIT/ML FlexPen Inject 4-10 Units into the skin 3 (three) times daily with meals. 09/09/20   Cassandria Anger, MD  insulin glargine (LANTUS SOLOSTAR) 100 UNIT/ML Solostar Pen Inject  40 Units into the skin at bedtime. 11/16/20   Cassandria Anger, MD  isosorbide mononitrate (IMDUR) 30 MG 24 hr tablet Take 1 tablet (30 mg total) by mouth daily. 09/14/20   Hayden Rasmussen, MD  lamoTRIgine (LAMICTAL) 25 MG tablet Take 25 mg by mouth 2 (two) times daily. 05/20/19   [provider]  lisinopril (ZESTRIL) 20 MG tablet TAKE ONE TABLET BY MOUTH ONCE DAILY. Patient taking differently: Take 20 mg by mouth daily. 10/12/20   Strader, Fransisco Hertz, PA-C  Melatonin 10 MG CAPS Take 1 capsule by mouth at bedtime as needed (for sleep).  06/12/19   [provider]  nitroGLYCERIN (NITROSTAT) 0.4 MG SL tablet Place 1 tablet (0.4 mg total) under the tongue every 5 (five) minutes as needed for chest pain. 01/15/20 04/14/20  Herminio Commons, MD  OLANZapine (ZYPREXA) 5 MG tablet Take 5 mg by mouth in the morning and at bedtime.    [provider]  oxyCODONE-acetaminophen (PERCOCET) 5-325 MG tablet Take 1-2 tablets by mouth every 4 (four) hours as needed for severe pain. 11/09/20   [name], DPM  paliperidone (INVEGA) 6 MG 24 hr tablet Take 12 mg by mouth daily.  01/19/21  [provider]  prazosin (MINIPRESS) 2 MG capsule Take 2-4 mg by mouth at bedtime as needed (for sleep).  12/03/19   [provider]  ranolazine (RANEXA) 500 MG 12 hr tablet Take 1 tablet (500 mg total) by  mouth 2 (two) times daily. 10/09/20   Strader, Fransisco Hertz, PA-C  risperiDONE (RISPERDAL) 3 MG tablet Take 3 mg by mouth at bedtime. 08/16/19   [provider]     Vital Signs: BP (!) 161/86   Pulse 64   Temp 97.6 F (36.4 C) (Oral)   Resp 18   Ht 5\' 7"  (1.702 m)   Wt 157 lb (71.2 kg)   SpO2 100%   BMI 24.59 kg/m   Physical Exam  NAD, alert Neuro: alert, yawning, intermittent difficulty focusing.  Speech intelligible but with word finding difficulties.  Moving all extremities.  Increased weakness noted in right lower extremities.  Pulses: palpable pulses Groin:  soft, no evidence of hematoma or pseudoaneurysm   Imaging: CT HEAD WO CONTRAST  Result Date: 12/17/2020 CLINICAL DATA:  55 year old male code stroke presentation with left MCA M1 ELVO status post endovascular reperfusion. EXAM: CT HEAD WITHOUT CONTRAST TECHNIQUE: Contiguous axial images were obtained from the base of the skull through the vertex without intravenous contrast. COMPARISON:  CTA, CTP and CT head yesterday. FINDINGS: Brain: Pseudo normalization of the left inferior frontal gyrus which was hypodense at presentation yesterday (series 3, image 19). Trace subarachnoid hemorrhage in the left central sulcus on series 3, image 24. Trace contrast staining suspected in the left inferior basal ganglia series 3, image 15. And questionable additional mild gyral contrast staining in the anterior left MCA territory. Scattered areas of subcortical and central white matter hypodensity in the left frontal lobe are otherwise stable to decreased in conspicuity (series 3, image 22). But there is no confluent cytotoxic edema evident, and no intracranial mass effect or midline shift. No ventriculomegaly. No other intracranial hemorrhage identified. Vascular: Left MCA M1 region vascular stent is new since presentation. Skull: No acute osseous abnormality identified. Sinuses/Orbits: Trace fluid and mucosal thickening in the paranasal sinuses now. Mastoids and tympanic cavities remain clear. Other: Intubated. Fluid in the nasopharynx. Negative orbit and scalp soft tissues. IMPRESSION: 1. Some pseudo-normalization of the left inferior frontal gyrus hypodensity since the presentation CT yesterday which might be related to contrast staining. But there is otherwise little to no cytotoxic edema evident in the Left MCA territory. 2. Trace subarachnoid hemorrhage in the left hemisphere. No malignant hemorrhagic transformation or intracranial mass effect. 3. New left MCA M1 stent. Electronically Signed   By: Genevie Ann M.D.   On:  12/17/2020 06:15   CT CEREBRAL PERFUSION W CONTRAST  Result Date: 12/16/2020 CLINICAL DATA:  Right-sided deficits EXAM: CT ANGIOGRAPHY HEAD AND NECK CT PERFUSION BRAIN TECHNIQUE: Multidetector CT imaging of the head and neck was performed using the standard protocol during bolus administration of intravenous contrast. Multiplanar CT image reconstructions and MIPs were obtained to evaluate the vascular anatomy. Carotid stenosis measurements (when applicable) are obtained utilizing NASCET criteria, using the distal internal carotid diameter as the denominator. Multiphase CT imaging of the brain was performed following IV bolus contrast injection. Subsequent parametric perfusion maps were calculated using RAPID software. CONTRAST:  147mL OMNIPAQUE IOHEXOL 350 MG/ML SOLN COMPARISON:  None. FINDINGS: CTA NECK FINDINGS SKELETON: There is no bony spinal canal stenosis. No lytic or blastic lesion. OTHER NECK: Normal pharynx, larynx and major salivary glands. No cervical lymphadenopathy. Unremarkable thyroid gland. UPPER CHEST: No pneumothorax or pleural effusion. No nodules or masses. AORTIC ARCH: There is no calcific atherosclerosis of the aortic arch. There is no aneurysm, dissection or hemodynamically significant stenosis of the visualized portion of the aorta. Conventional 3 vessel aortic branching pattern.  The visualized proximal subclavian arteries are widely patent. RIGHT CAROTID SYSTEM: Normal without aneurysm, dissection or stenosis. LEFT CAROTID SYSTEM: Normal without aneurysm, dissection or stenosis. VERTEBRAL ARTERIES: Left dominant configuration. Both origins are clearly patent. There is no dissection, occlusion or flow-limiting stenosis to the skull base (V1-V3 segments). CTA HEAD FINDINGS POSTERIOR CIRCULATION: --Vertebral arteries: Normal V4 segments. --Inferior cerebellar arteries: Normal. --Basilar artery: Normal. --Superior cerebellar arteries: Normal. --Posterior cerebral arteries (PCA): Multifocal  moderate-to-severe stenosis of the right P2 segment. Normal left. ANTERIOR CIRCULATION: --Intracranial internal carotid arteries: Normal. --Anterior cerebral arteries (ACA): Normal. Both A1 segments are present. Patent anterior communicating artery (a-comm). --Middle cerebral arteries (MCA): Complete occlusion of the left MCA at the proximal M1 segment with limited collateralization. Right MCA is normal. VENOUS SINUSES: As permitted by contrast timing, patent. ANATOMIC VARIANTS: None Review of the MIP images confirms the above findings. CT Brain Perfusion Findings: ASPECTS: 9 CBF (<30%) Volume: 21mL Perfusion (Tmax>6.0s) volume: 177mL Mismatch Volume: 159mL, likely slightly overestimated due to small area of completed infarction seen on the noncontrast head CT. Infarction Location:Left MCA territory IMPRESSION: 1. 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 2. Large area of ischemic penumbra in the left MCA territory. 3. 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 Critical Value/emergent results were called by telephone at the time of interpretation on 12/16/2020 at 9:02 pm to provider Christ Kick , who verbally acknowledged these results. Aortic Atherosclerosis (ICD10-I70.0). Electronically Signed   By: Ulyses Jarred M.D.   On: 12/16/2020 21:08   DG CHEST PORT 1 VIEW  Result Date: 12/17/2020 CLINICAL DATA:  Intubation. EXAM: PORTABLE CHEST 1 VIEW COMPARISON:  12/12/2020. FINDINGS: Endotracheal tube tip noted 4 cm above the carina. NG tube tip below left hemidiaphragm. Prior CABG. Heart size normal. Low lung volumes. Mild left base atelectasis/infiltrate cannot be excluded. Mild elevation left hemidiaphragm. No pleural effusion or pneumothorax. Prior cervical spine fusion. IMPRESSION: 1.  Endotracheal tube and NG tube in good anatomic position. 2.  Prior CABG.  Heart size normal. 3. Mild  left base atelectasis/infiltrate cannot be excluded. Mild elevation left hemidiaphragm. Electronically Signed   By: Marcello Moores  Register   On: 12/17/2020 05:18   EEG adult  Result Date: 12/17/2020 Lora Havens, MD     12/17/2020  1:00 PM Patient Name: Melvin Taylor MRN: [id number] Epilepsy Attending: Lora Havens Referring Physician/Provider: Dr. 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  EEG to evaluate for seizures. Level of alertness: Awake AEDs during EEG study: Lamotrigine, Depakote Technical aspects: This EEG study was done with scalp electrodes positioned according to the 10-20 International system of electrode placement. Electrical activity was acquired at a sampling rate of 500Hz  and reviewed with a high frequency filter of 70Hz  and a low frequency filter of 1Hz . EEG data were recorded continuously and digitally stored. Description: The posterior dominant rhythm consists of 8 Hz activity of moderate voltage (25-35 uV) seen predominantly in posterior head regions, symmetric and reactive to eye opening and eye closing. EEG showed intermittent 3 to 6 Hz theta-delta slowing in left hemisphere.  Photic driving was not seen during photic stimulation. Hyperventilation was not performed.   ABNORMALITY - Intermittent slow, left hemisphere IMPRESSION: This study is suggestive of cortical dysfunction arising from left hemisphere, nonspecific etiology. No seizures or  epileptiform discharges were seen throughout the recording. Lora Havens   CT HEAD CODE STROKE WO CONTRAST  Result Date: 12/16/2020 CLINICAL DATA:  Code stroke.  Right-sided deficits EXAM: CT HEAD WITHOUT CONTRAST TECHNIQUE: Contiguous axial images were obtained from the base of the skull through the vertex without intravenous contrast. COMPARISON:  12/12/2020 FINDINGS: Brain: There  is no mass, hemorrhage or extra-axial collection. The size and configuration of the ventricles and extra-axial CSF spaces are normal. Area of hypoattenuation in the inferior left frontal lobe, likely early subacute infarct. Small vessel infarcts in the left corona radiata are unchanged. Vascular: No abnormal hyperdensity of the major intracranial arteries or dural venous sinuses. No intracranial atherosclerosis. Skull: The visualized skull base, calvarium and extracranial soft tissues are normal. Sinuses/Orbits: No fluid levels or advanced mucosal thickening of the visualized paranasal sinuses. No mastoid or middle ear effusion. The orbits are normal. ASPECTS [facility] Stroke Program Early CT Score) - Ganglionic level infarction (caudate, lentiform nuclei, internal capsule, insula, M1-M3 cortex): 6 - Supraganglionic infarction (M4-M6 cortex): 3 Total score (0-10 with 10 being normal): 9 IMPRESSION: 1. No acute intracranial hemorrhage. 2. Suspected early subacute infarct of the inferior left frontal lobe. 3. ASPECTS is 9. These results were called by telephone at the time of interpretation on 12/16/2020 at 8:25 pm to provider [facility] , who verbally acknowledged these results. Electronically Signed   By: Ulyses Jarred M.D.   On: 12/16/2020 20:26   CT ANGIO HEAD CODE STROKE  Result Date: 12/16/2020 CLINICAL DATA:  Right-sided deficits EXAM: CT ANGIOGRAPHY HEAD AND NECK CT PERFUSION BRAIN TECHNIQUE: Multidetector CT imaging of the head and neck was performed using the standard protocol during bolus administration of intravenous contrast. Multiplanar CT image reconstructions and MIPs were obtained to evaluate the vascular anatomy. Carotid stenosis measurements (when applicable) are obtained utilizing NASCET criteria, using the distal internal carotid diameter as the denominator. Multiphase CT imaging of the brain was performed following IV bolus contrast injection. Subsequent parametric perfusion maps were  calculated using RAPID software. CONTRAST:  13mL OMNIPAQUE IOHEXOL 350 MG/ML SOLN COMPARISON:  None. FINDINGS: CTA NECK FINDINGS SKELETON: There is no bony spinal canal stenosis. No lytic or blastic lesion. OTHER NECK: Normal pharynx, larynx and major salivary glands. No cervical lymphadenopathy. Unremarkable thyroid gland. UPPER CHEST: No pneumothorax or pleural effusion. No nodules or masses. AORTIC ARCH: There is no calcific atherosclerosis of the aortic arch. There is no aneurysm, dissection or hemodynamically significant stenosis of the visualized portion of the aorta. Conventional 3 vessel aortic branching pattern. The visualized proximal subclavian arteries are widely patent. RIGHT CAROTID SYSTEM: Normal without aneurysm, dissection or stenosis. LEFT CAROTID SYSTEM: Normal without aneurysm, dissection or stenosis. VERTEBRAL ARTERIES: Left dominant configuration. Both origins are clearly patent. There is no dissection, occlusion or flow-limiting stenosis to the skull base (V1-V3 segments). CTA HEAD FINDINGS POSTERIOR CIRCULATION: --Vertebral arteries: Normal V4 segments. --Inferior cerebellar arteries: Normal. --Basilar artery: Normal. --Superior cerebellar arteries: Normal. --Posterior cerebral arteries (PCA): Multifocal moderate-to-severe stenosis of the right P2 segment. Normal left. ANTERIOR CIRCULATION: --Intracranial internal carotid arteries: Normal. --Anterior cerebral arteries (ACA): Normal. Both A1 segments are present. Patent anterior communicating artery (a-comm). --Middle cerebral arteries (MCA): Complete occlusion of the left MCA at the proximal M1 segment with limited collateralization. Right MCA is normal. VENOUS SINUSES: As permitted by contrast timing, patent. ANATOMIC VARIANTS: None Review of the MIP images confirms the above findings. CT Brain Perfusion Findings: ASPECTS: 9 CBF (<30%) Volume: 75mL Perfusion (Tmax>6.0s) volume: 135mL  Mismatch Volume: , likely slightly overestimated due  to small area of completed infarction seen on the noncontrast head CT. Infarction Location:Left MCA territory IMPRESSION: 1. 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 2. Large area of ischemic penumbra in the left MCA territory. 3. 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 Critical Value/emergent results were called by telephone at the time of interpretation on 12/16/2020 at 9:02 pm to provider Gray Bernhardt , who verbally acknowledged these results. Aortic Atherosclerosis (ICD10-I70.0). Electronically Signed   By: Deatra Robinson M.D.   On: 12/16/2020 21:08   CT ANGIO NECK CODE STROKE  Result Date: 12/16/2020 CLINICAL DATA:  Right-sided deficits EXAM: CT ANGIOGRAPHY HEAD AND NECK CT PERFUSION BRAIN TECHNIQUE: Multidetector CT imaging of the head and neck was performed using the standard protocol during bolus administration of intravenous contrast. Multiplanar CT image reconstructions and MIPs were obtained to evaluate the vascular anatomy. Carotid stenosis measurements (when applicable) are obtained utilizing NASCET criteria, using the distal internal carotid diameter as the denominator. Multiphase CT imaging of the brain was performed following IV bolus contrast injection. Subsequent parametric perfusion maps were calculated using RAPID software. CONTRAST:  OMNIPAQUE IOHEXOL 350 MG/ML SOLN COMPARISON:  None. FINDINGS: CTA NECK FINDINGS SKELETON: There is no bony spinal canal stenosis. No lytic or blastic lesion. OTHER NECK: Normal pharynx, larynx and major salivary glands. No cervical lymphadenopathy. Unremarkable thyroid gland. UPPER CHEST: No pneumothorax or pleural effusion. No nodules or masses. AORTIC ARCH: There is no calcific atherosclerosis of the aortic arch. There is no aneurysm, dissection or hemodynamically significant stenosis of the visualized portion of the aorta.  Conventional 3 vessel aortic branching pattern. The visualized proximal subclavian arteries are widely patent. RIGHT CAROTID SYSTEM: Normal without aneurysm, dissection or stenosis. LEFT CAROTID SYSTEM: Normal without aneurysm, dissection or stenosis. VERTEBRAL ARTERIES: Left dominant configuration. Both origins are clearly patent. There is no dissection, occlusion or flow-limiting stenosis to the skull base (V1-V3 segments). CTA HEAD FINDINGS POSTERIOR CIRCULATION: --Vertebral arteries: Normal V4 segments. --Inferior cerebellar arteries: Normal. --Basilar artery: Normal. --Superior cerebellar arteries: Normal. --Posterior cerebral arteries (PCA): Multifocal moderate-to-severe stenosis of the right P2 segment. Normal left. ANTERIOR CIRCULATION: --Intracranial internal carotid arteries: Normal. --Anterior cerebral arteries (ACA): Normal. Both A1 segments are present. Patent anterior communicating artery (a-comm). --Middle cerebral arteries (MCA): Complete occlusion of the left MCA at the proximal M1 segment with limited collateralization. Right MCA is normal. VENOUS SINUSES: As permitted by contrast timing, patent. ANATOMIC VARIANTS: None Review of the MIP images confirms the above findings. CT Brain Perfusion Findings: ASPECTS: 9 CBF (<30%) Volume: 4mL Perfusion (Tmax>6.0s) volume: Mismatch Volume: , likely slightly overestimated due to small area of completed infarction seen on the noncontrast head CT. Infarction Location:Left MCA territory IMPRESSION: 1. 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 2. Large area of ischemic penumbra in the left MCA territory. 3. 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 Critical Value/emergent results were called by telephone at the time of interpretation on 12/16/2020 at 9:02 pm to provider Gray Bernhardt , who verbally acknowledged these  results. Aortic Atherosclerosis (ICD10-I70.0). Electronically Signed   By: Deatra Robinson M.D.   On: 12/16/2020 21:08    Labs:  CBC: Recent Labs    12/12/20 1123 12/15/20 0929 12/16/20 2028 12/16/20 2038 12/17/20 0237 12/17/20 0254  WBC 5.2 3.8*  4.2  --   --  5.7  HGB 13.2 12.8* 12.0* 11.2* 11.9* 11.3*  HCT 38.4* 37.3* 34.5* 33.0* 35.0* 32.8*  PLT 200 213 183  --   --  173    COAGS: Recent Labs    12/16/20 2028  INR 1.0  APTT 29    BMP: Recent Labs    02/12/20 0000 02/12/20 1334 05/08/20 0000 08/25/20 0000 08/27/20 0000 12/12/20 1123 12/15/20 0929 12/16/20 2028 12/16/20 2038 12/17/20 0237 12/17/20 0254  NA 141 141  --   --    < > 138 137 135 139 139 136  K 4.5 4.5  --   --    < > 5.0 4.6 3.7 3.7 3.5 3.7  CL 103 103  --   --    < > 104 103 102 102  --  111  CO2 29* 29  --   --    < > 29 28 27   --   --  17*  GLUCOSE  --  177*  --   --    < > 134* 157* 175* 159*  --  235*  BUN 19 19 17   --    < > 17 14 22* 21*  --  18  CALCIUM 9.0 9.0 8.9 9.0   < > 8.7* 8.2* 7.7*  --   --  7.2*  CREATININE 1.5* 1.51* 1.5*  --    < > 1.43* 1.33* 2.15* 2.20*  --  1.75*  GFRNONAA 51 51* 53 44   < > 58* >60 35*  --   --  45*  GFRAA 59 59* 61 51  --   --   --   --   --   --   --    < > = values in this interval not displayed.    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 Recently extubated.  Yawning, some decreased attention. Word finding difficulties.  H/o seizure disorder but sister at bedside states he has had several this week which is atypical.  On Brilinta 90 mg BID- got morning dose today.   Groin stable, procedure sites intact without issue.  IR following.   Electronically Signed: Docia Barrier, PA 12/17/2020, 1:29 PM   I spent a total of 15 Minutes at the the patient's bedside AND on the patient's hospital floor or unit, greater than 50% of which was counseling/coordinating care for L MCA occlusion.

## 2020-12-17 NOTE — Progress Notes (Signed)
[facility] Progress Note Patient Name: Melvin Taylor DOB: [birth year] MRN: [id number]   Date of Service  12/17/2020  HPI/Events of Note  Patient transferred from [place] ED with left M1 (MCA territory ) occlusion for emergent thrombectomy which he has received, he was intubated at some point for airway protection, and is admitted to [facility] for acute respiratory failure, and post-thrombectomy ICU care. Patient also had severely uncontrolled hypertension for which he was started on Cleviprex. He did not receive TPA because of concern regarding areas of brain already showing evidence of infarction on his initial CT scan of the brain.  eICU Interventions  New Patient Evaluation.        Kerry Kass Zymir Napoli 12/17/2020, 2:57 AM

## 2020-12-17 NOTE — Procedures (Signed)
Extubation Procedure Note  Patient Details:   Name: Melvin Taylor DOB: Oct 23, 1965 MRN: [id number]   Airway Documentation:    Vent end date: 12/17/20 Vent end time: 1000   Evaluation  O2 sats: stable throughout Complications: No apparent complications Patient did tolerate procedure well. Bilateral Breath Sounds: Clear,Diminished   Yes   Patient extubated per MD order. Positive cuff leak. No stridor noted. Vitals are stable on 3L Miranda. RN at bedside.  Derron Pipkins H Benjamen Koelling 12/17/2020, 11:01 AM

## 2020-12-17 NOTE — Progress Notes (Signed)
Patient ID: Melvin Taylor, male   DOB: [birth year], 55 y.o.   MRN: [id number] INR. 10 Y RT H M MRS 0 to 1 . LSW 19.00.  New onset seizure with aphasia  RT hemiplegia and Lt gaze deviation . CT Brain NO [facility] ASPECTS 9. CTA occluded Lt MCA M1 seg .CTP core of 38ml and penumbra of 156 ml  Endovascular treatment D/W family  By Dr Curly Shores .  Reasons,risk of complications and alternatives reviewed. Consent for treatment obtained from sister. S.Adrik Khim MD

## 2020-12-17 NOTE — Plan of Care (Signed)
  Problem: Skin Integrity: Goal: Risk for impaired skin integrity will decrease Outcome: Progressing   Problem: Education: Goal: Knowledge of disease or condition will improve Outcome: Progressing   Problem: Self-Care: Goal: Ability to communicate needs accurately will improve Outcome: Progressing   Problem: Nutrition: Goal: Risk of aspiration will decrease Outcome: Progressing Goal: Dietary intake will improve Outcome: Progressing   Problem: Coping: Goal: Level of anxiety will decrease Outcome: Completed/Met

## 2020-12-17 NOTE — Progress Notes (Signed)
STROKE TEAM PROGRESS NOTE   SUBJECTIVE (INTERVAL HISTORY) His RN and sister are at the bedside.  Pt was extubated this am, tolerating well. Pending swallow evaluation. Lethargic but following most simple commands, still has intermittent perseveration, anomia but able to repeat. Paucity of speech with broca's aphasia.    OBJECTIVE Temp:  [97.5 F (36.4 C)-97.8 F (36.6 C)] 97.5 F (36.4 C) (05/19 0400) Pulse Rate:  [51-64] 59 (05/19 0930) Resp:  [13-19] 13 (05/19 0930) BP: (93-193)/(70-108) 120/80 (05/19 0930) SpO2:  [94 %-100 %] 100 % (05/19 0930) Arterial Line BP: (85-179)/(50-101) 131/68 (05/19 0930) FiO2 (%):  [40 %] 40 % (05/19 0823) Weight:  [71.2 kg] 71.2 kg (05/18 2022)  Recent Labs  Lab 12/16/20 2021 12/17/20 0334 12/17/20 0811  GLUCAP 165* 213* 227*   Recent Labs  Lab 12/12/20 1123 12/15/20 0929 12/15/20 1341 12/16/20 2028 12/16/20 2038 12/17/20 0237 12/17/20 0254  NA 138 137  --  135 139 139 136  K 5.0 4.6  --  3.7 3.7 3.5 3.7  CL 104 103  --  102 102  --  111  CO2 29 28  --  27  --   --  17*  GLUCOSE 134* 157*  --  175* 159*  --  235*  BUN 17 14  --  22* 21*  --  18  CREATININE 1.43* 1.33*  --  2.15* 2.20*  --  1.75*  CALCIUM 8.7* 8.2*  --  7.7*  --   --  7.2*  MG  --   --  1.7  --   --   --  1.6*   Recent Labs  Lab 12/12/20 1123 12/15/20 0929 12/16/20 2028  AST 15 20 16   ALT 13 13 12   ALKPHOS 42 41 39  BILITOT 0.8 0.7 0.5  PROT 5.1* 5.5* 5.1*  ALBUMIN 2.7* 2.8* 2.8*   Recent Labs  Lab 12/12/20 1123 12/15/20 0929 12/16/20 2028 12/16/20 2038 12/17/20 0237 12/17/20 0254  WBC 5.2 3.8* 4.2  --   --  5.7  NEUTROABS 2.3 1.2* 1.5*  --   --  4.5  HGB 13.2 12.8* 12.0* 11.2* 11.9* 11.3*  HCT 38.4* 37.3* 34.5* 33.0* 35.0* 32.8*  MCV 91.6 90.5 91.0  --   --  90.1  PLT 200 213 183  --   --  173   No results for input(s): CKTOTAL, CKMB, CKMBINDEX, TROPONINI in the last 168 hours. Recent Labs    12/16/20 2028  LABPROT 13.1  INR 1.0   Recent  Labs    12/17/20 0254  COLORURINE YELLOW  LABSPEC 1.038*  PHURINE 6.0  GLUCOSEU 50*  HGBUR SMALL*  BILIRUBINUR NEGATIVE  KETONESUR 5*  PROTEINUR 100*  NITRITE NEGATIVE  LEUKOCYTESUR NEGATIVE       Component Value Date/Time   CHOL 224 (H) 12/17/2020 0254   CHOL 286 (H) 02/12/2020 1334   TRIG 146 12/17/2020 0254   HDL 79 12/17/2020 0254   HDL 118 02/12/2020 1334   CHOLHDL 2.8 12/17/2020 0254   VLDL 29 12/17/2020 0254   LDLCALC 116 (H) 12/17/2020 0254   LDLCALC 145 (H) 02/12/2020 1334   Lab Results  Component Value Date   HGBA1C 9.7 (H) 12/17/2020      Component Value Date/Time   LABOPIA NONE DETECTED 12/17/2020 0254   COCAINSCRNUR NONE DETECTED 12/17/2020 0254   LABBENZ NONE DETECTED 12/17/2020 0254   AMPHETMU NONE DETECTED 12/17/2020 0254   THCU POSITIVE (A) 12/17/2020 0254   LABBARB NONE DETECTED 12/17/2020  [place]  Lab 12/15/20 0930 12/16/20 2028  ETH <10 <10    I have personally reviewed the radiological images below and agree with the radiology interpretations.  DG Sacrum/Coccyx  Result Date: 11/30/2020 CLINICAL DATA:  Recent fall with sacral pain, initial encounter EXAM: SACRUM AND COCCYX - 2+ VIEW COMPARISON:  None. FINDINGS: Sacrum is well visualized. Sacral ala are unremarkable. No acute fracture is seen. IMPRESSION: No acute abnormality noted. Electronically Signed   By: Inez Catalina M.D.   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 EXAM: CT HEAD WITHOUT CONTRAST TECHNIQUE: Contiguous axial images were obtained from the base of the skull through the vertex without intravenous contrast. COMPARISON:  CTA, CTP and CT head yesterday. FINDINGS: Brain: Pseudo normalization of the left inferior frontal gyrus which was hypodense at presentation yesterday (series 3, image 19). Trace subarachnoid hemorrhage in the  left central sulcus on series 3, image 24. Trace contrast staining suspected in the left inferior basal ganglia series 3, image 15. And questionable additional mild gyral contrast staining in the anterior left MCA territory. Scattered areas of subcortical and central white matter hypodensity in the left frontal lobe are otherwise stable to decreased in conspicuity (series 3, image 22). But there is no confluent cytotoxic edema evident, and no intracranial mass effect or midline shift. No ventriculomegaly. No other intracranial hemorrhage identified. Vascular: Left MCA M1 region vascular stent is new since presentation. Skull: No acute osseous abnormality identified. Sinuses/Orbits: Trace fluid and mucosal thickening in the paranasal sinuses now. Mastoids and tympanic cavities remain clear. Other: Intubated. Fluid in the nasopharynx. Negative orbit and scalp soft tissues. IMPRESSION: 1. Some pseudo-normalization of the left inferior frontal gyrus hypodensity since the presentation CT yesterday which might be related to contrast staining. But there is otherwise little to no cytotoxic edema evident in the Left MCA territory. 2. Trace subarachnoid hemorrhage in the left hemisphere. No malignant hemorrhagic transformation or intracranial mass effect. 3. New left MCA M1 stent. Electronically Signed   By: Genevie Ann M.D.   On: 12/17/2020 06:15   CT Head Wo Contrast  Result Date: 12/12/2020 CLINICAL DATA:  55 year old with acute onset of headache and possible fever that began yesterday. Current history of hypertension. EXAM: CT HEAD WITHOUT CONTRAST TECHNIQUE: Contiguous axial images were obtained from the base of the skull through the vertex without intravenous contrast. COMPARISON:  06/09/2018 and earlier. FINDINGS: Brain: Ventricular system normal in size and appearance for age. No mass lesion. No midline shift. No acute hemorrhage or hematoma. No extra-axial fluid collections. No evidence of acute infarction. No  interval change. Vascular: Mild BILATERAL carotid siphon atherosclerosis. No hyperdense vessel. Skull: No skull fracture or other focal osseous abnormality involving the skull. Sinuses/Orbits: Visualized paranasal sinuses, bilateral mastoid air cells and bilateral middle ear cavities well-aerated. Visualized orbits and globes normal in appearance. Other: None. IMPRESSION: No acute intracranial abnormality.  Stable examination. Electronically Signed   By: Evangeline Dakin M.D.   On: 12/12/2020 11:57   CT CEREBRAL PERFUSION W CONTRAST  Result Date: 12/16/2020 CLINICAL DATA:  Right-sided deficits EXAM: CT ANGIOGRAPHY HEAD AND NECK CT PERFUSION BRAIN TECHNIQUE: Multidetector CT imaging of the head and neck was performed using the standard protocol during bolus administration of intravenous contrast. Multiplanar CT image reconstructions and MIPs were obtained to evaluate the vascular anatomy. Carotid stenosis measurements (  when applicable) are obtained utilizing NASCET criteria, using the distal internal carotid diameter as the denominator. Multiphase CT imaging of the brain was performed following IV bolus contrast injection. Subsequent parametric perfusion maps were calculated using RAPID software. CONTRAST:  165mL OMNIPAQUE IOHEXOL 350 MG/ML SOLN COMPARISON:  None. FINDINGS: CTA NECK FINDINGS SKELETON: There is no bony spinal canal stenosis. No lytic or blastic lesion. OTHER NECK: Normal pharynx, larynx and major salivary glands. No cervical lymphadenopathy. Unremarkable thyroid gland. UPPER CHEST: No pneumothorax or pleural effusion. No nodules or masses. AORTIC ARCH: There is no calcific atherosclerosis of the aortic arch. There is no aneurysm, dissection or hemodynamically significant stenosis of the visualized portion of the aorta. Conventional 3 vessel aortic branching pattern. The visualized proximal subclavian arteries are widely patent. RIGHT CAROTID SYSTEM: Normal without aneurysm, dissection or stenosis.  LEFT CAROTID SYSTEM: Normal without aneurysm, dissection or stenosis. VERTEBRAL ARTERIES: Left dominant configuration. Both origins are clearly patent. There is no dissection, occlusion or flow-limiting stenosis to the skull base (V1-V3 segments). CTA HEAD FINDINGS POSTERIOR CIRCULATION: --Vertebral arteries: Normal V4 segments. --Inferior cerebellar arteries: Normal. --Basilar artery: Normal. --Superior cerebellar arteries: Normal. --Posterior cerebral arteries (PCA): Multifocal moderate-to-severe stenosis of the right P2 segment. Normal left. ANTERIOR CIRCULATION: --Intracranial internal carotid arteries: Normal. --Anterior cerebral arteries (ACA): Normal. Both A1 segments are present. Patent anterior communicating artery (a-comm). --Middle cerebral arteries (MCA): Complete occlusion of the left MCA at the proximal M1 segment with limited collateralization. Right MCA is normal. VENOUS SINUSES: As permitted by contrast timing, patent. ANATOMIC VARIANTS: None Review of the MIP images confirms the above findings. CT Brain Perfusion Findings: ASPECTS: 9 CBF (<30%) Volume: 58mL Perfusion (Tmax>6.0s) volume: 143mL Mismatch Volume: 138mL, likely slightly overestimated due to small area of completed infarction seen on the noncontrast head CT. Infarction Location:Left MCA territory IMPRESSION: 1. 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 2. Large area of ischemic penumbra in the left MCA territory. 3. 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 Critical Value/emergent results were called by telephone at the time of interpretation on 12/16/2020 at 9:02 pm to provider Christ Kick , who verbally acknowledged these results. Aortic Atherosclerosis (ICD10-I70.0). Electronically Signed   By: Ulyses Jarred M.D.   On: 12/16/2020 21:08   DG CHEST PORT 1 VIEW  Result Date: 12/17/2020 CLINICAL DATA:   Intubation. EXAM: PORTABLE CHEST 1 VIEW COMPARISON:  12/12/2020. FINDINGS: Endotracheal tube tip noted 4 cm above the carina. NG tube tip below left hemidiaphragm. Prior CABG. Heart size normal. Low lung volumes. Mild left base atelectasis/infiltrate cannot be excluded. Mild elevation left hemidiaphragm. No pleural effusion or pneumothorax. Prior cervical spine fusion. IMPRESSION: 1.  Endotracheal tube and NG tube in good anatomic position. 2.  Prior CABG.  Heart size normal. 3. Mild left base atelectasis/infiltrate cannot be excluded. Mild elevation left hemidiaphragm. Electronically Signed   By: Marcello Moores  Register   On: 12/17/2020 05:18   DG Chest Port 1 View  Result Date: 12/12/2020 CLINICAL DATA:  Patient complaining of headache and overall not feeling well since yesterday. States that he has not taken BP meds in one day. Reports generalized pain and believes he may have been running fever at home. EXAM: PORTABLE CHEST - 1 VIEW COMPARISON:  09/14/2020 FINDINGS: Lungs are clear.  Previous CABG. Heart size and mediastinal contours are within normal limits. Aortic Atherosclerosis (ICD10-170.0). No effusion.  No pneumothorax. Visualized bones unremarkable. IMPRESSION: No acute  cardiopulmonary disease post CABG. Electronically Signed   By: Corlis Leak M.D.   On: 12/12/2020 11:48   CT HEAD CODE STROKE WO CONTRAST  Result Date: 12/16/2020 CLINICAL DATA:  Code stroke.  Right-sided deficits EXAM: CT HEAD WITHOUT CONTRAST TECHNIQUE: Contiguous axial images were obtained from the base of the skull through the vertex without intravenous contrast. COMPARISON:  12/12/2020 FINDINGS: Brain: There is no mass, hemorrhage or extra-axial collection. The size and configuration of the ventricles and extra-axial CSF spaces are normal. Area of hypoattenuation in the inferior left frontal lobe, likely early subacute infarct. Small vessel infarcts in the left corona radiata are unchanged. Vascular: No abnormal hyperdensity of the  major intracranial arteries or dural venous sinuses. No intracranial atherosclerosis. Skull: The visualized skull base, calvarium and extracranial soft tissues are normal. Sinuses/Orbits: No fluid levels or advanced mucosal thickening of the visualized paranasal sinuses. No mastoid or middle ear effusion. The orbits are normal. ASPECTS [facility] Stroke Program Early CT Score) - Ganglionic level infarction (caudate, lentiform nuclei, internal capsule, insula, M1-M3 cortex): 6 - Supraganglionic infarction (M4-M6 cortex): 3 Total score (0-10 with 10 being normal): 9 IMPRESSION: 1. No acute intracranial hemorrhage. 2. Suspected early subacute infarct of the inferior left frontal lobe. 3. ASPECTS is 9. These results were called by telephone at the time of interpretation on 12/16/2020 at 8:25 pm to provider [facility] , who verbally acknowledged these results. Electronically Signed   By: Deatra Robinson M.D.   On: 12/16/2020 20:26   CT ANGIO HEAD CODE STROKE  Result Date: 12/16/2020 CLINICAL DATA:  Right-sided deficits EXAM: CT ANGIOGRAPHY HEAD AND NECK CT PERFUSION BRAIN TECHNIQUE: Multidetector CT imaging of the head and neck was performed using the standard protocol during bolus administration of intravenous contrast. Multiplanar CT image reconstructions and MIPs were obtained to evaluate the vascular anatomy. Carotid stenosis measurements (when applicable) are obtained utilizing NASCET criteria, using the distal internal carotid diameter as the denominator. Multiphase CT imaging of the brain was performed following IV bolus contrast injection. Subsequent parametric perfusion maps were calculated using RAPID software. CONTRAST:  OMNIPAQUE IOHEXOL 350 MG/ML SOLN COMPARISON:  None. FINDINGS: CTA NECK FINDINGS SKELETON: There is no bony spinal canal stenosis. No lytic or blastic lesion. OTHER NECK: Normal pharynx, larynx and major salivary glands. No cervical lymphadenopathy. Unremarkable thyroid gland. UPPER  CHEST: No pneumothorax or pleural effusion. No nodules or masses. AORTIC ARCH: There is no calcific atherosclerosis of the aortic arch. There is no aneurysm, dissection or hemodynamically significant stenosis of the visualized portion of the aorta. Conventional 3 vessel aortic branching pattern. The visualized proximal subclavian arteries are widely patent. RIGHT CAROTID SYSTEM: Normal without aneurysm, dissection or stenosis. LEFT CAROTID SYSTEM: Normal without aneurysm, dissection or stenosis. VERTEBRAL ARTERIES: Left dominant configuration. Both origins are clearly patent. There is no dissection, occlusion or flow-limiting stenosis to the skull base (V1-V3 segments). CTA HEAD FINDINGS POSTERIOR CIRCULATION: --Vertebral arteries: Normal V4 segments. --Inferior cerebellar arteries: Normal. --Basilar artery: Normal. --Superior cerebellar arteries: Normal. --Posterior cerebral arteries (PCA): Multifocal moderate-to-severe stenosis of the right P2 segment. Normal left. ANTERIOR CIRCULATION: --Intracranial internal carotid arteries: Normal. --Anterior cerebral arteries (ACA): Normal. Both A1 segments are present. Patent anterior communicating artery (a-comm). --Middle cerebral arteries (MCA): Complete occlusion of the left MCA at the proximal M1 segment with limited collateralization. Right MCA is normal. VENOUS SINUSES: As permitted by contrast timing, patent. ANATOMIC VARIANTS: None Review of the MIP images confirms the above findings. CT Brain Perfusion Findings: ASPECTS: 9  CBF (<30%) Volume: 9mL Perfusion (Tmax>6.0s) volume: 147mL Mismatch Volume: 152mL, likely slightly overestimated due to small area of completed infarction seen on the noncontrast head CT. Infarction Location:Left MCA territory IMPRESSION: 1. 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 2. Large area of ischemic penumbra in the left MCA territory. 3. Multifocal  moderate-to-severe stenosis of the right posterior cerebral artery P2 segment. Critical Value/emergent results were called by telephone at the time of interpretation on 12/16/2020 at 9:02 pm to provider Christ Kick , who verbally acknowledged these results. Aortic Atherosclerosis (ICD10-I70.0). Electronically Signed   By: Ulyses Jarred M.D.   On: 12/16/2020 21:08   CT ANGIO NECK CODE STROKE  Result Date: 12/16/2020 CLINICAL DATA:  Right-sided deficits EXAM: CT ANGIOGRAPHY HEAD AND NECK CT PERFUSION BRAIN TECHNIQUE: Multidetector CT imaging of the head and neck was performed using the standard protocol during bolus administration of intravenous contrast. Multiplanar CT image reconstructions and MIPs were obtained to evaluate the vascular anatomy. Carotid stenosis measurements (when applicable) are obtained utilizing NASCET criteria, using the distal internal carotid diameter as the denominator. Multiphase CT imaging of the brain was performed following IV bolus contrast injection. Subsequent parametric perfusion maps were calculated using RAPID software. CONTRAST:  139mL OMNIPAQUE IOHEXOL 350 MG/ML SOLN COMPARISON:  None. FINDINGS: CTA NECK FINDINGS SKELETON: There is no bony spinal canal stenosis. No lytic or blastic lesion. OTHER NECK: Normal pharynx, larynx and major salivary glands. No cervical lymphadenopathy. Unremarkable thyroid gland. UPPER CHEST: No pneumothorax or pleural effusion. No nodules or masses. AORTIC ARCH: There is no calcific atherosclerosis of the aortic arch. There is no aneurysm, dissection or hemodynamically significant stenosis of the visualized portion of the aorta. Conventional 3 vessel aortic branching pattern. The visualized proximal subclavian arteries are widely patent. RIGHT CAROTID SYSTEM: Normal without aneurysm, dissection or stenosis. LEFT CAROTID SYSTEM: Normal without aneurysm, dissection or stenosis. VERTEBRAL ARTERIES: Left dominant configuration. Both origins are clearly  patent. There is no dissection, occlusion or flow-limiting stenosis to the skull base (V1-V3 segments). CTA HEAD FINDINGS POSTERIOR CIRCULATION: --Vertebral arteries: Normal V4 segments. --Inferior cerebellar arteries: Normal. --Basilar artery: Normal. --Superior cerebellar arteries: Normal. --Posterior cerebral arteries (PCA): Multifocal moderate-to-severe stenosis of the right P2 segment. Normal left. ANTERIOR CIRCULATION: --Intracranial internal carotid arteries: Normal. --Anterior cerebral arteries (ACA): Normal. Both A1 segments are present. Patent anterior communicating artery (a-comm). --Middle cerebral arteries (MCA): Complete occlusion of the left MCA at the proximal M1 segment with limited collateralization. Right MCA is normal. VENOUS SINUSES: As permitted by contrast timing, patent. ANATOMIC VARIANTS: None Review of the MIP images confirms the above findings. CT Brain Perfusion Findings: ASPECTS: 9 CBF (<30%) Volume: 54mL Perfusion (Tmax>6.0s) volume: 172mL Mismatch Volume: 168mL, likely slightly overestimated due to small area of completed infarction seen on the noncontrast head CT. Infarction Location:Left MCA territory IMPRESSION: 1. 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 2. Large area of ischemic penumbra in the left MCA territory. 3. 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 Critical Value/emergent results were called by telephone at the time of interpretation on 12/16/2020 at 9:02 pm to provider Christ Kick , who verbally acknowledged these results. Aortic Atherosclerosis (ICD10-I70.0). Electronically Signed   By: Ulyses Jarred M.D.   On: 12/16/2020 21:08    PHYSICAL EXAM  Temp:  [97.5 F (36.4 C)-97.8 F (36.6 C)] 97.5 F (36.4 C) (05/19  0400) Pulse Rate:  [51-64] 59 (05/19 0930) Resp:  [13-19] 13 (05/19 0930) BP: (93-193)/(70-108) 120/80 (05/19  0930) SpO2:  [94 %-100 %] 100 % (05/19 0930) Arterial Line BP: (85-179)/(50-101) 131/68 (05/19 0930) FiO2 (%):  [40 %] 40 % (05/19 0823) Weight:  [71.2 kg] 71.2 kg (05/18 2022)  General - Well nourished, well developed, lethargic.  Ophthalmologic - fundi not visualized due to noncooperation.  Cardiovascular - Regular rhythm and bradycardia.  Neuro - awake, alert, lethargic, eyes open, orientated to people and self, but not to age or time. Paucity of speech with intermittent nonsensical words and paraphasic errors with perseveration, moderate dysarthria, following most simple commands. Able to repeat simple sentences but complex sentences, not able to name. No gaze palsy, tracking bilaterally, visual field full, PERRL. No facial droop. Tongue midline. LUE no drift, RUE pronator drift, but finger grip 4/5 bilaterally. BLE 3/5 proximal, distally left 5/5 and right 4/5. Sensation symmetrical bilaterally, b/l FTN intact grossly, gait not tested.   ASSESSMENT/PLAN [name] is a 55 y.o. male with history of seizure on Depakote and Lamictal, CAD status post CABG, diabetes, hypertension, hyperlipidemia, OSA, substance abuse admitted for seizure, right-sided weakness and slurred speech. No tPA given due to outside window.    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   CT repeat left MCA infarct with trace SAH  MRI scattered left MCA infarcts. small chronic cortical infarct within the right occipital lobe.  MRA left M1 patent, high-grade stenosis inferior left M2, moderate stenosis right M1, severe stenosis, bilateral P2.  2D Echo  EF 65-70%  LDL 116  HgbA1c 9.7  Lovenox for VTE prophylaxis  clopidogrel 75 mg daily prior to admission, now on Brilinta (ticagrelor) 90 mg bid post stent,  patient has allergy to aspirin.  Patient counseled to be compliant with his antithrombotic medications  Ongoing aggressive stroke risk factor management  Therapy recommendations: Pending  Disposition: Pending  Seizure  History of seizure on Depakote and Lamictal  Recent breakthrough seizure  Continue Depakote 1500 twice daily, libido 50 twice daily  EEG no seizure  Depakote level daily in a.m.  Diabetes  HgbA1c 9.7 goal < 7.0  Uncontrolled  CBG monitoring  SSI  DM education and close PCP follow up  Hypertension . Stable . BP goal 120-140 within 24h of IR  Long term BP goal 130-150 given intracranial stenosis  Hyperlipidemia  Home meds: Lipitor 40  LDL 116, goal < 70  Now on Lipitor 80  Continue statin at discharge  Tobacco abuse  Current smoker  Smoking cessation counseling provided  Pt is willing to quit  Other Stroke Risk Factors  UDS positive THC, cessation education provided  ETOH use  Hx stroke/TIA - old infarct on MRI  Coronary artery disease status post CABG  Obstructive sleep apnea, on CPAP at home  Other Active Problems  AKI on CKD IIa, Cre 2.15->2.20->1.75, IV fluid  Hospital day # 1  This patient is critically ill due to left MCA stroke, left MCA occlusion, status post IR, history of seizure, and at significant risk of neurological worsening, death form recurrent stroke, hemorrhagic conversion, status epilepticus, heart failure. This patient's care requires constant monitoring of vital signs, hemodynamics, respiratory and cardiac monitoring, review of multiple databases, neurological assessment, discussion  with family, other specialists and medical decision making of high complexity. I spent 40 minutes of neurocritical care time in the care of this patient. I had long discussion with sister at bedside, updated pt current condition, treatment plan and potential prognosis, and answered all the questions.  She expressed understanding  and appreciation.    Rosalin Hawking, MD PhD Stroke Neurology 12/17/2020 10:54 AM    To contact Stroke Continuity provider, please refer to [email or web address] After hours, contact General Neurology

## 2020-12-17 NOTE — Progress Notes (Addendum)
   PCCM interval Note  52 yoM seen this morning in consult with Left M1 occlusion s/p NIR with stent placement.   No issues per RN.    TTE pending Remains on low dose propofol 10-15 for coughing/ gagging Restarted on cleviprex for SBP goal < 140 Currently weaning PSV 5/5 since 0830 Following commands, moving/ f/c simple commands on right now   Blood pressure 105/75, pulse (!) 51, temperature (!) 97.5 F (36.4 C), temperature source Axillary, resp. rate 16, height 5\' 7"  (1.702 m), weight 71.2 kg, SpO2 100 %.  Vent Mode: PSV;CPAP FiO2 (%):  [40 %] 40 % Set Rate:  [16 bmp] 16 bmp Vt Set:  [520 mL] 520 mL PEEP:  [5 cmH20] 5 cmH20 Pressure Support:  [5 cmH20] 5 cmH20 Plateau Pressure:  [14 cmH20-15 cmH20] 15 cmH20   General:  Adult male intubated on MV in NAD on low dose propofol HEENT: MM pink/moist, ETT/ OGT, pupils 3/reactive Neuro: opens eye to verbal, tracks, f/c on left 5/5, right 2/5 CV: NSR, no murmur PULM:  Non labored, minimal secretions, doing well on PSV 5/5 GI: soft, bs +, NT, foley  Extremities: warm/dry, no LE edema  Skin: no rashes    P:  Hopeful to extubate this morning Will give PO meds but will need enteral access if fails SLP for brillinta with stent placement Ongoing neuro exams  Ongoing stroke workup per Neuro   Addendum 0923- patient ready for extubation.  Discussed with Dr. Tacy Learn.  CCM extubation orders placed.  SLP/ PT/ OT ordered.   Sister updated at bedside.    Additional CCT 20 mins   Kennieth Rad, ACNP [facility] 12/17/2020, 11:36 AM

## 2020-12-17 NOTE — Procedures (Signed)
S/P Lt common carotid arteriogram  RT CFA approach. 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  Post CT brain No ICH. 63f exoseal used for hemostasis at the Rt CFA puncture site.. Distal pulses dopplerable bilaterally. Patient left intubated for airway protection as patient was not responsive prior to intubation.  Meds .  Cangrelor IV bolus followed by 4 hr infusion.Repeat CT brain after stopping the cangrelor at approx 410 am. PO 180 mg brilinta . Patient allergic to aspirin.  Pupils 63mm RT = LT sluggish. S.Jayjay Littles MD

## 2020-12-17 NOTE — Progress Notes (Signed)
*  PRELIMINARY RESULTS* Echocardiogram 2D Echocardiogram has been performed.  Luisa Hart  RDCS 12/17/2020, 9:07 AM

## 2020-12-17 NOTE — Anesthesia Postprocedure Evaluation (Signed)
Anesthesia Post Note  Patient: Melvin Taylor  Procedure(s) Performed: IR WITH ANESTHESIA (N/A )     Patient location during evaluation: ICU Anesthesia Type: General Level of consciousness: sedated and patient remains intubated per anesthesia plan Pain management: pain level controlled Vital Signs Assessment: post-procedure vital signs reviewed and stable Respiratory status: patient remains intubated per anesthesia plan Cardiovascular status: stable Postop Assessment: no apparent nausea or vomiting Anesthetic complications: no   No complications documented.  Last Vitals:  Vitals:   12/16/20 2024 12/16/20 2133  BP: (!) 193/108 (!) 175/94  Pulse: 62 63  Resp: 18 13  Temp: 36.6 C   SpO2: 99% 100%    Last Pain:  Vitals:   12/16/20 2024  TempSrc: Axillary                 Audry Pili

## 2020-12-17 NOTE — Progress Notes (Signed)
OT Cancellation Note  Patient Details Name: PRIMITIVO MERKEY MRN: [id number] DOB: [birth year]   Cancelled Treatment:    Reason Eval/Treat Not Completed: Active bedrest order (s/p emergent thrombectomy. will assess when medically ready.)  [facility]  Maurie Boettcher, OT/L   Acute OT Clinical Specialist Acute Rehabilitation Services Pager [phone] Office [phone]  12/17/2020, 9:24 AM

## 2020-12-17 NOTE — Progress Notes (Signed)
EEG complete - results pending 

## 2020-12-17 NOTE — Transfer of Care (Signed)
Immediate Anesthesia Transfer of Care Note  Patient: Melvin Taylor  Procedure(s) Performed: IR WITH ANESTHESIA (N/A )  Patient Location: ICU  Anesthesia Type:General  Level of Consciousness: Patient remains intubated per anesthesia plan  Airway & Oxygen Therapy: Patient remains intubated per anesthesia plan and Patient placed on Ventilator (see vital sign flow sheet for setting)  Post-op Assessment: Report given to RN and Post -op Vital signs reviewed and stable  Post vital signs: Reviewed and stable  Last Vitals:  Vitals Value Taken Time  BP    Temp    Pulse    Resp    SpO2      Last Pain:  Vitals:   12/16/20 2024  TempSrc: Axillary         Complications: No complications documented.

## 2020-12-17 NOTE — Consult Note (Signed)
NAME:  Melvin Taylor, MRN:  [id number], DOB:  Apr 27, 1966, LOS: 1 ADMISSION DATE:  12/16/2020, CONSULTATION DATE: 12/17/20 REFERRING MD:  [place], CHIEF COMPLAINT:  stroke   History of Present Illness:  Melvin Taylor is a 55 y.o. M with PMH significant for  DM, HTN, HL, seizures, CAD, polysubstance abuse and schizophrenia who presented with R-sided weakness and speech difficulties..  Per records, he has an aide that has been out of town and he subsequently missed some doses of Depakote and had a breakthrough seizure earlier this week.  On 5/18 he was on a fishing trip with his father when he was startled by a snake around 4pm. Shortly thereafter, he was noted to have abnormal speech and R-sided weakness and so presented to [facility] ED.  He was found to have L M1 occlusion and was transferred to [facility] for emergent thrombectomy.  There was concern for subacute appearing infarct on initial head CT, therefore did not receive TPA. Blood pressure was as high as 214/119 and he was started on Cleviprex.  He was intubated and PCCM consulted on transfer to neuro ICU.  Pertinent  Medical History   has a past medical history of Anxiety, Bipolar 1 disorder ([place]), Bone spur, Cervical radiculopathy, Chest pain, Chronic back pain, Chronic chest wall pain, Chronic neck pain, Coronary artery disease, Depression, Diabetes mellitus, Hallucinations, Headache(784.0), History of gout, HTN (hypertension), Insomnia, Myocardial infarction ([place]) (2017), Neuropathy, Pain management, Polysubstance abuse ([place]), Right leg pain, Schizophrenia ([place]), Seizures ([place]), Shortness of breath dyspnea, Sleep apnea, and Transfusion of blood product refused for religious reason.   Significant Hospital Events: Including procedures, antibiotic start and stop dates in addition to other pertinent events   . 5/18 Presented to [facility] ED with stroke symptoms . 5/19 Transferred to [facility], taken to [facility] for successful embolectomy and L. M1 stenting.  Repeat CT  without hemorrhage   Interim History / Subjective:  Transferred to Intensive care in hemodynamically stable condition  Objective   Blood pressure (!) 175/94, pulse 63, temperature 97.8 F (36.6 C), temperature source Axillary, resp. rate 13, height 5\' 7"  (1.702 m), weight 71.2 kg, SpO2 100 %.        Intake/Output Summary (Last 24 hours) at 12/17/2020 0125 Last data filed at 12/17/2020 0021 Gross per 24 hour  Intake 800 ml  Output 1000 ml  Net -200 ml   Filed Weights   12/16/20 2022  Weight: 71.2 kg    General:  Thin, elderly-appearing M intubated in no distress HEENT: MM pink/moist, ETT in place Neuro: examined on propofol and given rocuronium before ICU txfr, therefore not responsive, moving extremities or triggering breaths on vent CV: s1s2 rrr, no m/r/g PULM:  Course upper airway sounds, moderate secretions, no rhonchi or wheezing, requiring PRVC 40% FiO2 and PEEP 5 GI: soft, bsx4 active  Extremities: warm/dry, no edema, R groin sheath site without bleeding Skin: no rashes or lesions   Labs/imaging that I havepersonally reviewed  (right click and "Reselect all SmartList Selections" daily)   BMP-creatinine 2.2 CBC CTA head  Resolved Hospital Problem list     Assessment & Plan:    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 -HOB elevated 30 degrees. -Plateau pressures less than 30 cm H20.  -Follow chest x-ray, ABG prn.   -Bronchial hygiene and RT/bronchodilator  protocol.   History of Seizure Disorder On Depakote and Lamictal Recently missed doses and had breakthrough seizure P: -seizure precautions -continue home Lamictal and Depakote -Serial neuro checks    AKI Creatinine 2.2 Baseline 1.2-1.5 in epic P: -received 500cc IVF bolus -monitor UOP and BMP -follow electrolytes and avoid nephrotoxins      HTN Takes Norvasc, Lisinopril, Minipress and Imdur at baseline Last Echo in 2020 LVEF 60-65% and no RH failure On Plavix at home P: -currently on Cleviprex gtt, goal SBP <140, likely resume po medications in the AM -Echo pending     Type 2 DM Takes Lantus 40 units  P: -SSI, hold Lantus while NPO -A1c pending   History of Schizophrenia and polysubstance abuse -Risperdone, Lexapro and Lamictal ordered    Chronic Pain On Baclofen   Best practice (right click and "Reselect all SmartList Selections" daily)  Diet:  NPO Pain/Anxiety/Delirium protocol (if indicated): Yes (RASS goal -1,-2) VAP protocol (if indicated): Yes DVT prophylaxis: Contraindicated GI prophylaxis: PPI Glucose control:  SSI Yes Central venous access:  N/A Arterial line:  Yes, and it is still needed Foley:  Yes, and it is still needed Mobility:  bed rest  PT consulted: Yes Last date of multidisciplinary goals of care discussion [pending] Code Status:  full code Disposition: ICU  Labs   CBC: Recent Labs  Lab 12/12/20 1123 12/15/20 0929 12/16/20 2028 12/16/20 2038  WBC 5.2 3.8* 4.2  --   NEUTROABS 2.3 1.2* 1.5*  --   HGB 13.2 12.8* 12.0* 11.2*  HCT 38.4* 37.3* 34.5* 33.0*  MCV 91.6 90.5 91.0  --   PLT 200 213 183  --     Basic Metabolic Panel: Recent Labs  Lab 12/12/20 1123 12/15/20 0929 12/15/20 1341 12/16/20 2028 12/16/20 2038  NA 138 137  --  135 139  K 5.0 4.6  --  3.7 3.7  CL 104 103  --  102 102  CO2 29 28  --  27  --   GLUCOSE 134* 157*  --  175* 159*  BUN 17 14  --  22* 21*  CREATININE 1.43* 1.33*  --  2.15* 2.20*   CALCIUM 8.7* 8.2*  --  7.7*  --   MG  --   --  1.7  --   --    GFR: Estimated Creatinine Clearance: 35.1 mL/min (A) (by C-G formula based on SCr of 2.2 mg/dL (H)). Recent Labs  Lab 12/12/20 1123 12/15/20 0929 12/16/20 2028  WBC 5.2 3.8* 4.2    Liver Function Tests: Recent Labs  Lab 12/12/20 1123 12/15/20 0929 12/16/20 2028  AST 15 20 16   ALT 13 13 12   ALKPHOS 42 41 39  BILITOT 0.8 0.7 0.5  PROT 5.1* 5.5* 5.1*  ALBUMIN 2.7* 2.8* 2.8*   No results for input(s): LIPASE, AMYLASE in the last 168 hours. No results for input(s): AMMONIA in the last 168 hours.  ABG    Component Value Date/Time   PHART 7.384 03/28/2016 1736   PCO2ART 40.8 03/28/2016 1736   PO2ART 175.0 (H) 03/28/2016 1736   HCO3 24.3 (H) 03/28/2016 1736   TCO2 26 12/16/2020 2038   ACIDBASEDEF 1.0 03/28/2016 1736   O2SAT 100.0 03/28/2016 1736  Coagulation Profile: Recent Labs  Lab 12/16/20 2028  INR 1.0    Cardiac Enzymes: No results for input(s): CKTOTAL, CKMB, CKMBINDEX, TROPONINI in the last 168 hours.  HbA1C: Hemoglobin A1C  Date/Time Value Ref Range Status  08/25/2020 12:00 AM 12.7  Final  05/08/2020 12:00 AM 10.4  Final    CBG: Recent Labs  Lab 12/16/20 2021  GLUCAP 165*    Review of Systems:   Unable to obtain secondary to intubated  Past Medical History:  He,  has a past medical history of Anxiety, Bipolar 1 disorder ([place]), Bone spur, Cervical radiculopathy, Chest pain, Chronic back pain, Chronic chest wall pain, Chronic neck pain, Coronary artery disease, Depression, Diabetes mellitus, Hallucinations, Headache(784.0), History of gout, HTN (hypertension), Insomnia, Myocardial infarction ([place]) (2017), Neuropathy, Pain management, Polysubstance abuse ([place]), Right leg pain, Schizophrenia ([place]), Seizures ([place]), Shortness of breath dyspnea, Sleep apnea, and Transfusion of blood product refused for religious reason.   Surgical History:   Past Surgical History:  Procedure  Laterality Date  . ANTERIOR CERVICAL DECOMP/DISCECTOMY FUSION N/A 12/14/2015   Procedure: ANTERIOR CERVICAL DECOMPRESSION/DISCECTOMY FUSION CERVICAL FIVE -SIX;  Surgeon: Earnie Larsson, MD;  Location: [place] NEURO ORS;  Service: Neurosurgery;  Laterality: N/A;  . BIOPSY  05/07/2015   Procedure: BIOPSY (Gastric);  Surgeon: [name], MD;  Location: [facility] ORS;  Service: Endoscopy;;  . CARDIAC CATHETERIZATION N/A 03/07/2016   Procedure: Left Heart Cath and Coronary Angiography;  Surgeon: Belva Crome, MD;  Location: [place] CV LAB;  Service: Cardiovascular;  Laterality: N/A;  . COLONOSCOPY WITH PROPOFOL N/A 05/07/2015   IOE:[id number] diverticulosis, multiple colon polyps removed, tubular adenoma, serrated colon polyp. Next colonoscopy October 2019  . COLONOSCOPY WITH PROPOFOL N/A 08/02/2018   Procedure: COLONOSCOPY WITH PROPOFOL;  Surgeon: [name], MD;  Location: [facility] ENDO SUITE;  Service: Endoscopy;  Laterality: N/A;  12:00pm  . CORONARY ARTERY BYPASS GRAFT N/A 03/28/2016   Procedure: CORONARY ARTERY BYPASS GRAFTING (CABG) x 5 USING GREATER SAPHENOUS VEIN;  Surgeon: [name], MD;  Location: [place] OR;  Service: Open Heart Surgery;  Laterality: N/A;  . ENDOVEIN HARVEST OF GREATER SAPHENOUS VEIN Right 03/28/2016   Procedure: ENDOVEIN HARVEST OF GREATER SAPHENOUS VEIN;  Surgeon: [name], MD;  Location: [place];  Service: Open Heart Surgery;  Laterality: Right;  . ESOPHAGEAL DILATION N/A 05/07/2015   Procedure: ESOPHAGEAL DILATION WITH 56FR MALONEY DILATOR;  Surgeon: [name], MD;  Location: [facility];  Service: Endoscopy;  Laterality: N/A;  . ESOPHAGOGASTRODUODENOSCOPY (EGD) WITH PROPOFOL N/A 05/07/2015   RMR: Status post dilation of normal esophagus. Gastritis.  Marland Kitchen KNEE SURGERY Left    arthroscopy  . MANDIBLE FRACTURE SURGERY    . POLYPECTOMY  05/07/2015   Procedure: POLYPECTOMY (Hepatic Flexure, Distal Transverse Colon, Rectal);  Surgeon: [name], MD;  Location: [facility];  Service:  Endoscopy;;  . TEE WITHOUT CARDIOVERSION N/A 03/28/2016   Procedure: TRANSESOPHAGEAL ECHOCARDIOGRAM (TEE);  Surgeon: [name], MD;  Location: [place];  Service: Open Heart Surgery;  Laterality: N/A;  . TONSILLECTOMY       Social History:   reports that he has been smoking cigarettes. He has a 15.00 pack-year smoking history. He has never used smokeless tobacco. He reports current drug use. Drug: Marijuana. He reports that he does not drink alcohol.   Family History:  His family history includes Arthritis in an other family member; Asthma in an other family member; Diabetes in his father and another family member; Hypertension  in his father; Stroke in his sister.   Allergies Allergies  Allergen Reactions  . Aspirin Swelling and Other (See Comments)    Facial swelling      Home Medications  Prior to Admission medications   Medication Sig Start Date End Date Taking? Authorizing Provider  albuterol (PROVENTIL HFA;VENTOLIN HFA) 108 (90 Base) MCG/ACT inhaler Inhale 2 puffs into the lungs every 4 (four) hours as needed for wheezing or shortness of breath.     [provider]  amLODipine (NORVASC) 10 MG tablet Take 1 tablet (10 mg total) by mouth daily. 04/14/20   Verta Ellen., NP  atorvastatin (LIPITOR) 40 MG tablet Take 40 mg by mouth daily. Take 1 tablet at bedtime.    [provider]  baclofen (LIORESAL) 10 MG tablet Take 10 mg by mouth 2 (two) times daily. 02/18/20   [provider]  clopidogrel (PLAVIX) 75 MG tablet Take 75 mg by mouth daily.    [provider]  diazepam (VALIUM) 5 MG tablet Take 5 mg by mouth 2 (two) times daily as needed for anxiety.     [provider]  divalproex (DEPAKOTE) 500 MG DR tablet Take 3 tablets (1,500 mg total) by mouth 2 (two) times daily. 06/02/20   Penumalli, Earlean Polka, MD  escitalopram (LEXAPRO) 20 MG tablet Take 20 mg by mouth daily.     [provider]  ibuprofen (ADVIL) 800 MG tablet Take  800 mg by mouth every 6 (six) hours as needed for moderate pain. 11/17/20   [provider]  insulin aspart (NOVOLOG) 100 UNIT/ML FlexPen Inject 4-10 Units into the skin 3 (three) times daily with meals. 09/09/20   Cassandria Anger, MD  insulin glargine (LANTUS SOLOSTAR) 100 UNIT/ML Solostar Pen Inject 40 Units into the skin at bedtime. 11/16/20   Cassandria Anger, MD  isosorbide mononitrate (IMDUR) 30 MG 24 hr tablet Take 1 tablet (30 mg total) by mouth daily. 09/14/20   Hayden Rasmussen, MD  lamoTRIgine (LAMICTAL) 25 MG tablet Take 50 mg by mouth 2 (two) times daily.  05/20/19   [provider]  lisinopril (ZESTRIL) 20 MG tablet TAKE ONE TABLET BY MOUTH ONCE DAILY. Patient taking differently: Take 20 mg by mouth daily. 10/12/20   Strader, Fransisco Hertz, PA-C  Melatonin 10 MG CAPS Take 1 capsule by mouth at bedtime as needed (for sleep).  06/12/19   [provider]  nitroGLYCERIN (NITROSTAT) 0.4 MG SL tablet Place 1 tablet (0.4 mg total) under the tongue every 5 (five) minutes as needed for chest pain. 01/15/20 04/14/20  Herminio Commons, MD  oxyCODONE-acetaminophen (PERCOCET) 5-325 MG tablet Take 1-2 tablets by mouth every 4 (four) hours as needed for severe pain. 11/09/20   [name], DPM  paliperidone (INVEGA) 6 MG 24 hr tablet Take 12 mg by mouth daily.  01/19/21  [provider]  prazosin (MINIPRESS) 2 MG capsule Take 2-4 mg by mouth at bedtime as needed (for sleep).  12/03/19   [provider]  ranolazine (RANEXA) 500 MG 12 hr tablet Take 1 tablet (500 mg total) by mouth 2 (two) times daily. 10/09/20   Strader, Fransisco Hertz, PA-C  risperiDONE (RISPERDAL) 3 MG tablet Take 3 mg by mouth at bedtime. 08/16/19   [provider]     Critical care time: 45 minutes       CRITICAL CARE Performed by: Otilio Carpen Rahul Malinak   Total critical care time: 45 minutes  Critical care time  was exclusive of separately billable procedures and treating  other patients.  Critical care was necessary to treat or prevent imminent or life-threatening deterioration.  Critical care was time spent personally by me on the following activities: development of treatment plan with patient and/or surrogate as well as nursing, discussions with consultants, evaluation of patient's response to treatment, examination of patient, obtaining history from patient or surrogate, ordering and performing treatments and interventions, ordering and review of laboratory studies, ordering and review of radiographic studies, pulse oximetry and re-evaluation of patient's condition.   Otilio Carpen Sameka Bagent, PA-C [facility] See Amion for pager If no response to pager , please call [phone] until 7pm After 7:00 pm call Elink  [phone]

## 2020-12-17 NOTE — Progress Notes (Signed)
PT Cancellation Note  Patient Details Name: Melvin Taylor MRN: [id number] DOB: 1965-09-11   Cancelled Treatment:    Reason Eval/Treat Not Completed: Patient at procedure or test/unavailable. Will follow-up as time permits and as appropriate.   Moishe Spice, PT, DPT Acute Rehabilitation Services  Pager: [phone] Office: [place] 12/17/2020, 3:07 PM

## 2020-12-17 NOTE — Progress Notes (Incomplete)
Patient ID: Melvin Taylor, male   DOB: 1965-09-21, 55 y.o.   MRN: [id number] INR.

## 2020-12-18 DIAGNOSIS — E11649 Type 2 diabetes mellitus with hypoglycemia without coma: Secondary | ICD-10-CM | POA: Diagnosis not present

## 2020-12-18 DIAGNOSIS — I639 Cerebral infarction, unspecified: Secondary | ICD-10-CM | POA: Diagnosis not present

## 2020-12-18 DIAGNOSIS — E440 Moderate protein-calorie malnutrition: Secondary | ICD-10-CM | POA: Insufficient documentation

## 2020-12-18 DIAGNOSIS — I63512 Cerebral infarction due to unspecified occlusion or stenosis of left middle cerebral artery: Secondary | ICD-10-CM | POA: Diagnosis not present

## 2020-12-18 DIAGNOSIS — E1159 Type 2 diabetes mellitus with other circulatory complications: Secondary | ICD-10-CM | POA: Diagnosis not present

## 2020-12-18 DIAGNOSIS — N289 Disorder of kidney and ureter, unspecified: Secondary | ICD-10-CM | POA: Diagnosis not present

## 2020-12-18 HISTORY — DX: Moderate protein-calorie malnutrition: E44.0

## 2020-12-18 LAB — BASIC METABOLIC PANEL
Anion gap: 5 (ref 5–15)
BUN: 19 mg/dL (ref 6–20)
CO2: 19 mmol/L — ABNORMAL LOW (ref 22–32)
Calcium: 7.8 mg/dL — ABNORMAL LOW (ref 8.9–10.3)
Chloride: 114 mmol/L — ABNORMAL HIGH (ref 98–111)
Creatinine, Ser: 1.74 mg/dL — ABNORMAL HIGH (ref 0.61–1.24)
GFR, Estimated: 45 mL/min — ABNORMAL LOW (ref >60)
Glucose, Bld: 86 mg/dL (ref 70–99)
Potassium: 3.7 mmol/L (ref 3.5–5.1)
Sodium: 138 mmol/L (ref 135–145)

## 2020-12-18 LAB — CBC WITH DIFFERENTIAL/PLATELET
Abs Immature Granulocytes: 0.01 10*3/uL (ref 0.00–0.07)
Basophils Absolute: 0 10*3/uL (ref 0.0–0.1)
Basophils Relative: 0 %
Eosinophils Absolute: 0 10*3/uL (ref 0.0–0.5)
Eosinophils Relative: 0 %
HCT: 31.5 % — ABNORMAL LOW (ref 39.0–52.0)
Hemoglobin: 11.1 g/dL — ABNORMAL LOW (ref 13.0–17.0)
Immature Granulocytes: 0 %
Lymphocytes Relative: 21 %
Lymphs Abs: 1.3 10*3/uL (ref 0.7–4.0)
MCH: 31.6 pg (ref 26.0–34.0)
MCHC: 35.2 g/dL (ref 30.0–36.0)
MCV: 89.7 fL (ref 80.0–100.0)
Monocytes Absolute: 0.5 10*3/uL (ref 0.1–1.0)
Monocytes Relative: 7 %
Neutro Abs: 4.4 10*3/uL (ref 1.7–7.7)
Neutrophils Relative %: 72 %
Platelets: 158 10*3/uL (ref 150–400)
RBC: 3.51 MIL/uL — ABNORMAL LOW (ref 4.22–5.81)
RDW: 12.2 % (ref 11.5–15.5)
WBC: 6.2 10*3/uL (ref 4.0–10.5)
nRBC: 0 % (ref 0.0–0.2)

## 2020-12-18 LAB — GLUCOSE, CAPILLARY
Glucose-Capillary: 88 mg/dL (ref 70–99)
Glucose-Capillary: 124 mg/dL — ABNORMAL HIGH (ref 70–99)
Glucose-Capillary: 55 mg/dL — ABNORMAL LOW (ref 70–99)
Glucose-Capillary: 56 mg/dL — ABNORMAL LOW (ref 70–99)
Glucose-Capillary: 112 mg/dL — ABNORMAL HIGH (ref 70–99)
Glucose-Capillary: 158 mg/dL — ABNORMAL HIGH (ref 70–99)
Glucose-Capillary: 231 mg/dL — ABNORMAL HIGH (ref 70–99)
Glucose-Capillary: 163 mg/dL — ABNORMAL HIGH (ref 70–99)

## 2020-12-18 LAB — PHOSPHORUS: Phosphorus: 4.3 mg/dL (ref 2.5–4.6)

## 2020-12-18 LAB — VALPROIC ACID LEVEL: Valproic Acid Lvl: 150 ug/mL — ABNORMAL HIGH (ref 50.0–100.0)

## 2020-12-18 LAB — MAGNESIUM: Magnesium: 2.8 mg/dL — ABNORMAL HIGH (ref 1.7–2.4)

## 2020-12-18 MED ORDER — TAMSULOSIN HCL 0.4 MG PO CAPS
0.4000 mg | ORAL_CAPSULE | Freq: Every day | ORAL | Status: AC
  Administered 2020-12-18 – 2020-12-20 (×3): 0.4 mg via ORAL
  Filled 2020-12-18 (×3): qty 1

## 2020-12-18 MED ORDER — ENSURE ENLIVE PO LIQD
237.0000 mL | Freq: Three times a day (TID) | ORAL | Status: DC
  Administered 2020-12-18 – 2020-12-21 (×8): 237 mL via ORAL

## 2020-12-18 MED ORDER — LAMOTRIGINE 25 MG PO TABS
50.0000 mg | ORAL_TABLET | Freq: Two times a day (BID) | ORAL | Status: DC
  Administered 2020-12-18 – 2020-12-21 (×7): 50 mg via ORAL
  Filled 2020-12-18 (×7): qty 2

## 2020-12-18 MED ORDER — DEXTROSE 50 % IV SOLN
INTRAVENOUS | Status: AC
  Administered 2020-12-18: 25 mL
  Filled 2020-12-18: qty 50

## 2020-12-18 MED ORDER — ESCITALOPRAM OXALATE 10 MG PO TABS
20.0000 mg | ORAL_TABLET | Freq: Every day | ORAL | Status: DC
  Administered 2020-12-18 – 2020-12-21 (×4): 20 mg via ORAL
  Filled 2020-12-18: qty 1
  Filled 2020-12-18 (×4): qty 2

## 2020-12-18 MED ORDER — DIVALPROEX SODIUM 250 MG PO DR TAB
1000.0000 mg | DELAYED_RELEASE_TABLET | Freq: Two times a day (BID) | ORAL | Status: DC
  Administered 2020-12-18 – 2020-12-21 (×6): 1000 mg via ORAL
  Filled 2020-12-18: qty 2
  Filled 2020-12-18 (×6): qty 4

## 2020-12-18 MED ORDER — INSULIN ASPART 100 UNIT/ML IJ SOLN: 0.0000 [IU] | INTRAMUSCULAR | Status: DC

## 2020-12-18 MED ORDER — ADULT MULTIVITAMIN W/MINERALS CH
1.0000 | ORAL_TABLET | Freq: Every day | ORAL | Status: DC
  Administered 2020-12-18 – 2020-12-21 (×4): 1 via ORAL
  Filled 2020-12-18 (×4): qty 1

## 2020-12-18 MED ORDER — ATORVASTATIN CALCIUM 80 MG PO TABS
80.0000 mg | ORAL_TABLET | Freq: Every day | ORAL | Status: DC
  Administered 2020-12-18 – 2020-12-20 (×3): 80 mg via ORAL
  Filled 2020-12-18 (×3): qty 1

## 2020-12-18 MED ORDER — INSULIN ASPART 100 UNIT/ML IJ SOLN
0.0000 [IU] | Freq: Three times a day (TID) | INTRAMUSCULAR | Status: DC
  Administered 2020-12-18: 2 [IU] via SUBCUTANEOUS
  Administered 2020-12-18 – 2020-12-19 (×4): 3 [IU] via SUBCUTANEOUS
  Administered 2020-12-19 – 2020-12-20 (×2): 1 [IU] via SUBCUTANEOUS
  Administered 2020-12-20 (×2): 2 [IU] via SUBCUTANEOUS
  Administered 2020-12-21: 1 [IU] via SUBCUTANEOUS

## 2020-12-18 NOTE — Evaluation (Signed)
Speech Language Pathology Evaluation Patient Details Name: Melvin Taylor MRN: [id number] DOB: 1965-10-20 Today's Date: 12/18/2020 Time: 0865-7846 SLP Time Calculation (min) (ACUTE ONLY): 16 min  Problem List:  Patient Active Problem List   Diagnosis Date Noted  . Malnutrition of moderate degree 12/18/2020  . Middle cerebral artery embolism, 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  . Encounter for intubation   . Renal insufficiency   . Malignant hypertension   . Acute ischemic left MCA stroke ([place]) 12/16/2020  . Current smoker 06/21/2019  . Seizure-like activity ([place]) 05/06/2019  . Diabetes mellitus ([place]) 04/11/2019  . Anemia, chronic disease 07/04/2018  . Other specified diseases of the digestive system 10/30/2017  . CAD (coronary artery disease) 03/28/2016  . Chest pain 03/21/2016  . Mixed hyperlipidemia 03/11/2016  . Ischemic chest pain ([place])   . NSTEMI (non-ST elevated myocardial infarction) ([place])   . Coronary atherosclerosis of native coronary artery 03/03/2016  . Cervical spinal stenosis 12/14/2015  . Loss of weight 08/25/2015  . Essential hypertension, benign 07/23/2015  . Personal history of noncompliance with medical treatment, presenting hazards to health 07/23/2015  . Abdominal pain 06/08/2015  . Constipation 06/08/2015  . History of colonic polyps   . Diverticulosis of colon without hemorrhage   . Mucosal abnormality of stomach   . Encounter for screening colonoscopy 04/16/2015  . Dysphagia 04/16/2015  . Partial symptomatic epilepsy with complex partial seizures, intractable, without status epilepticus ([place]) 11/10/2014  . Bipolar disorder, unspecified ([place]) 10/15/2014  . Schizophrenia ([place]) 10/15/2014  . DM type 2 causing vascular disease ([place]) 06/29/2014  . Musculoskeletal pain 06/29/2014  . Hyperglycemia without ketosis 09/07/2013  . 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 Other internal derangements of unspecified knee 01/13/2011  . Knee pain 12/28/2010  . Medial  meniscus, posterior horn derangement 12/28/2010   Past Medical History:  Past Medical History:  Diagnosis Date  . Anxiety   . Bipolar 1 disorder ([place])   . Bone spur    left heel  . Cervical radiculopathy   . Chest pain   . Chronic back pain   . Chronic chest wall pain   . Chronic neck pain   . Coronary artery disease    a. s/p CABG in 03/2016 with LIMA-LAD, SVG-D1, SVG-RCA, and Seq SVG-mid and distal OM  . Depression   . Diabetes mellitus    Type II  . Hallucinations    "long history of them"  . Headache(784.0)   . History of gout   . HTN (hypertension)   . Insomnia   . Myocardial infarction ([place]) 2017  . Neuropathy   . Pain management   . Polysubstance abuse ([place])   . Right leg pain    chronic  . Schizophrenia ([place])   . Seizures ([place])    last sz between July 5-9th, 2016; epilepsy  . Shortness of breath dyspnea    with exertion  . Sleep apnea   . Transfusion of blood product refused for religious reason    Past Surgical History:  Past Surgical History:  Procedure Laterality Date  . ANTERIOR CERVICAL DECOMP/DISCECTOMY FUSION N/A 12/14/2015   Procedure: ANTERIOR CERVICAL DECOMPRESSION/DISCECTOMY FUSION CERVICAL FIVE -SIX;  Surgeon: Earnie Larsson, MD;  Location: [facility];  Service: Neurosurgery;  Laterality: N/A;  . BIOPSY  05/07/2015   Procedure: BIOPSY (Gastric);  Surgeon: [name], MD;  Location: [facility];  Service: Endoscopy;;  . CARDIAC CATHETERIZATION N/A 03/07/2016   Procedure: Left Heart Cath and Coronary Angiography;  Surgeon: Belva Crome,  MD;  Location: [facility];  Service: Cardiovascular;  Laterality: N/A;  . COLONOSCOPY WITH PROPOFOL N/A 05/07/2015   CNO:[id number] diverticulosis, multiple colon polyps removed, tubular adenoma, serrated colon polyp. Next colonoscopy October 2019  . COLONOSCOPY WITH PROPOFOL N/A 08/02/2018   Procedure: COLONOSCOPY WITH PROPOFOL;  Surgeon: [name], MD;  Location: [facility];  Service: Endoscopy;   Laterality: N/A;  12:00pm  . CORONARY ARTERY BYPASS GRAFT N/A 03/28/2016   Procedure: CORONARY ARTERY BYPASS GRAFTING (CABG) x 5 USING GREATER SAPHENOUS VEIN;  Surgeon: [name], MD;  Location: [facility] OR;  Service: Open Heart Surgery;  Laterality: N/A;  . ENDOVEIN HARVEST OF GREATER SAPHENOUS VEIN Right 03/28/2016   Procedure: ENDOVEIN HARVEST OF GREATER SAPHENOUS VEIN;  Surgeon: [name], MD;  Location: [facility];  Service: Open Heart Surgery;  Laterality: Right;  . ESOPHAGEAL DILATION N/A 05/07/2015   Procedure: ESOPHAGEAL DILATION WITH 56FR MALONEY DILATOR;  Surgeon: [name], MD;  Location: [facility];  Service: Endoscopy;  Laterality: N/A;  . ESOPHAGOGASTRODUODENOSCOPY (EGD) WITH PROPOFOL N/A 05/07/2015   RMR: Status post dilation of normal esophagus. Gastritis.  Marland Kitchen KNEE SURGERY Left    arthroscopy  . MANDIBLE FRACTURE SURGERY    . POLYPECTOMY  05/07/2015   Procedure: POLYPECTOMY (Hepatic Flexure, Distal Transverse Colon, Rectal);  Surgeon: [name], MD;  Location: [facility];  Service: Endoscopy;;  . RADIOLOGY WITH ANESTHESIA N/A 12/16/2020   Procedure: IR WITH ANESTHESIA;  Surgeon: Luanne Bras, MD;  Location: [facility];  Service: Radiology;  Laterality: N/A;  . TEE WITHOUT CARDIOVERSION N/A 03/28/2016   Procedure: TRANSESOPHAGEAL ECHOCARDIOGRAM (TEE);  Surgeon: [name], MD;  Location: [facility];  Service: Open Heart Surgery;  Laterality: N/A;  . TONSILLECTOMY     HPI:  Mr. AHMAUD DUTHIE is a 55 y.o. male with history of seizure, schizophrenia, CAD status post CABG, diabetes, hypertension, hyperlipidemia, OSA, substance abuse admitted for seizure, right-sided weakness and slurred speech. Imaging revealed an acute left MCA and underwent thrombectomy.   Assessment / Plan / Recommendation Clinical Impression  Mr Bradby primarily demonstrated cognitive impairments with communication-linguistic factors during evaluation. He verbally communicated in short phrases and had  difficulty expounding on his thoughts and many times attempted to describe/explain responses to open ended biographical questions and stated "I can't or I can't pronounce it" Verbalizations were appropriate with one noted phonemic paraphasia. A larger influece was his delayed processing significant difficulty attending, formulating and organizing his thoughts. Difficulty naming items in simple categories. He will need 24 hour supervision. Therapist recommends CIR.    SLP Assessment  SLP Recommendation/Assessment: Patient needs continued Speech Lanaguage Pathology Services SLP Visit Diagnosis: Cognitive communication deficit (R41.841)    Follow Up Recommendations  Inpatient Rehab    Frequency and Duration min 2x/week  2 weeks      SLP Evaluation Cognition  Overall Cognitive Status: Impaired/Different from baseline Arousal/Alertness: Awake/alert Orientation Level: Oriented to person Attention: Sustained Sustained Attention: Impaired Sustained Attention Impairment: Verbal basic Memory: Impaired Memory Impairment: Decreased recall of new information Awareness: Impaired Awareness Impairment: Intellectual impairment;Emergent impairment;Anticipatory impairment Problem Solving: Impaired Problem Solving Impairment: Verbal basic;Functional basic Safety/Judgment: Impaired       Comprehension  Auditory Comprehension Overall Auditory Comprehension: Impaired Commands: Impaired Two Step Basic Commands: 50-74% accurate Interfering Components: Attention Visual Recognition/Discrimination Discrimination: Not tested Reading Comprehension Reading Status: Not tested    Expression Expression Primary Mode of Expression: Verbal Verbal Expression Overall Verbal Expression: Impaired Initiation: No impairment  Level of Generative/Spontaneous Verbalization: Sentence Repetition: No impairment Naming: Impairment Divergent: 25-49% accurate Verbal Errors: Phonemic paraphasias Pragmatics: No  impairment Written Expression Dominant Hand: Right Written Expression: Not tested   Oral / Motor  Oral Motor/Sensory Function Overall Oral Motor/Sensory Function:  (no significant weakness) Motor Speech Overall Motor Speech: Appears within functional limits for tasks assessed Intelligibility: Intelligible Motor Planning: Witnin functional limits   GO                    Houston Siren 12/18/2020, 3:36 PM  Orbie Pyo Makina Skow M.Ed Risk analyst [phone] Office [phone]

## 2020-12-18 NOTE — Evaluation (Signed)
Physical Therapy Evaluation Patient Details Name: Melvin Taylor MRN: [id number] DOB: [birth year] Today's Date: 12/18/2020   History of Present Illness  55 y.o.brought in 5/17 by EMS for code stroke. Witnessed seizure by family. Transferred to [facility] from [facility]. S/p thrombectomy/IR for L M1 occlusion 5/19. MRI + scattered acute infarcts L MCA and PCA watershed territories affecting the cortical/subcortical frontal, parietal and occipital lobes. Mulitple acute infarcts within the L insula and basal ganglia.  PMH significant for  DM, HTN, HL, seizures, CAD, polysubstance abuse, CKD stage 3, BiPolar disorder and schizophrenia.    Clinical Impression  Pt presents with condition above and deficits mentioned below, see PT Problem List. PTA pt lives alone and has a PCA 6 days/wk, 4 hrs/day who assists with IADL tasks, including cooking, cleaning and medication management. Family assists as well and manages his finances. At baseline pt is independent with functional mobility with a RW and ADL tasks and does some of his cooking and cleaning/laundry and likes to walk and go fishing. Pt currently demonstrates R side weakness, incoordination, and decreased sensation and proprioception along with intermittent R side neglect, resulting in poor placement within the RW and management of his RW in the environment. Pt is impulsive and demonstrates a decline in his cognitive status. He is at risk for falls and injuries and would be unsafe to be home alone. Pt needing minA for basic mobility within his room with a RW this date, ambulating short bedroom distances. Will continue to follow acutely. Pt would greatly benefit from intensive therapy in the CIR setting prior to return home to maximize his safety and independence with all functional mobility.   Follow Up Recommendations CIR;Supervision/Assistance - 24 hour    Equipment Recommendations  None recommended by PT    Recommendations for Other Services Rehab  consult     Precautions / Restrictions Precautions Precautions: Fall Precaution Comments: R neglect Restrictions Weight Bearing Restrictions: No      Mobility  Bed Mobility Overal bed mobility: Needs Assistance Bed Mobility: Supine to Sit     Supine to sit: Min guard     General bed mobility comments: Pt able to come to sit with min guard for safety and extra time.    Transfers Overall transfer level: Needs assistance Equipment used: Rolling walker (2 wheeled) Transfers: Sit to/from [facility] Sit to Stand: Min assist;+2 safety/equipment Stand pivot transfers: Min assist;+2 safety/equipment       General transfer comment: MinA for steadying and to direct pt to step to L to transfer to recliner with RW. cues to reach back for target surface. Sit > stand 1x from bed, 2x from recliner, and 1x from toilet.  Ambulation/Gait Ambulation/Gait assistance: Min assist Gait Distance (Feet):  (x4 bouts of ~3 ft > ~10 ft > ~15 ft > ~15 ft) Assistive device: Rolling walker (2 wheeled) Gait Pattern/deviations: Step-through pattern;Decreased step length - right;Decreased stride length;Decreased dorsiflexion - right;Trunk flexed Gait velocity: reduced Gait velocity interpretation: <1.31 ft/sec, indicative of household ambulator General Gait Details: Pt with short, unstable gait, impulsively reaching off BOS on occasion. Pt neglectful of R side, bumping into objects and stepping R foot lateral to RW, needing cues to attend to and correct. MinA for steadying and directing pt.  Stairs            Wheelchair Mobility    Modified Rankin (Stroke Patients Only) Modified Rankin (Stroke Patients Only) Pre-Morbid Rankin Score: Moderate disability Modified Rankin: Moderately severe disability  Balance Overall balance assessment: Needs assistance Sitting-balance support: No upper extremity supported;Feet supported Sitting balance-Leahy Scale: Good     Standing  balance support: Bilateral upper extremity supported Standing balance-Leahy Scale: Poor Standing balance comment: UE support on RW.                             Pertinent Vitals/Pain Pain Assessment: Faces Faces Pain Scale: No hurt Pain Intervention(s): Monitored during session    Home Living Family/patient expects to be discharged to:: Private residence Living Arrangements: Alone Available Help at Discharge: Family;Personal care attendant (PCA 4 hrs/day 6 days/wk) Type of Home: Apartment Home Access: Level entry     Home Layout: One level Home Equipment: Walker - 2 wheels      Prior Function Level of Independence: Needs assistance;Independent;Independent with assistive device(s)   Gait / Transfers Assistance Needed: ambulated independently  ADL's / Homemaking Assistance Needed: I with ADL, assist with IADL tasks form PCA for medicaiton managment, cooking, some shopping; family also assisted. Pt does some cooking himself and occasionally does his own laundry. L:ikes to walk        Hand Dominance   Dominant Hand: Right    Extremity/Trunk Assessment   Upper Extremity Assessment Upper Extremity Assessment: Defer to OT evaluation    Lower Extremity Assessment Lower Extremity Assessment: RLE deficits/detail RLE Deficits / Details: Generalized weakness of grossly 4 to 4+ with MMT compared to 4+ to 5 on L; Decreased proprioception and sensation noted with mobility; decreased coordination with dysdiadochokinesia and possible dysmetria noted RLE Sensation: decreased proprioception (noted with mobility; was trying to rub shin without heel touching and did not notice, demonstrating impaired sensation) RLE Coordination: decreased gross motor;decreased fine motor (dysdiadochokinesia and possible dysmetria noted)    Cervical / Trunk Assessment Cervical / Trunk Assessment: Normal  Communication   Communication: Expressive difficulties  Cognition Arousal/Alertness:  Awake/alert Behavior During Therapy: Flat affect;Impulsive Overall Cognitive Status: Impaired/Different from baseline Area of Impairment: Orientation;Attention;Memory;Following commands;Safety/judgement;Awareness;Problem solving                 Orientation Level: Disoriented to;Place;Time (aware he had a stroke) Current Attention Level: Selective Memory: Decreased recall of precautions;Decreased short-term memory Following Commands: Follows one step commands with increased time Safety/Judgement: Decreased awareness of safety;Decreased awareness of deficits Awareness: Intellectual Problem Solving: Slow processing;Difficulty sequencing;Requires verbal cues General Comments: Pt with poor awareness into his deficits and thus safety, needing cues to attend to R side at times. Pt impulsive and forgetful of task at hand.      General Comments      Exercises     Assessment/Plan    PT Assessment Patient needs continued PT services  PT Problem List Decreased strength;Decreased activity tolerance;Decreased balance;Decreased mobility;Decreased coordination;Decreased cognition;Decreased knowledge of use of DME;Decreased safety awareness;Impaired sensation       PT Treatment Interventions DME instruction;Gait training;Functional mobility training;Therapeutic activities;Balance training;Therapeutic exercise;Neuromuscular re-education;Cognitive remediation;Patient/family education    PT Goals (Current goals can be found in the Care Plan section)  Acute Rehab PT Goals Patient Stated Goal: to get better PT Goal Formulation: With patient Time For Goal Achievement: 01/01/21 Potential to Achieve Goals: Good    Frequency Min 4X/week   Barriers to discharge        Co-evaluation PT/OT/SLP Co-Evaluation/Treatment: Yes Reason for Co-Treatment: Necessary to address cognition/behavior during functional activity;For patient/therapist safety;To address functional/ADL transfers PT goals  addressed during session: Mobility/safety with mobility;Balance;Proper use of DME  AM-PAC PT "6 Clicks" Mobility  Outcome Measure Help needed turning from your back to your side while in a flat bed without using bedrails?: A Little Help needed moving from lying on your back to sitting on the side of a flat bed without using bedrails?: A Little Help needed moving to and from a bed to a chair (including a wheelchair)?: A Little Help needed standing up from a chair using your arms (e.g., wheelchair or bedside chair)?: A Little Help needed to walk in hospital room?: A Little Help needed climbing 3-5 steps with a railing? : A Lot 6 Click Score: 17    End of Session Equipment Utilized During Treatment: Gait belt Activity Tolerance: Patient tolerated treatment well Patient left: in chair;with call bell/phone within reach;with chair alarm set Nurse Communication: Mobility status PT Visit Diagnosis: Unsteadiness on feet (R26.81);Other abnormalities of gait and mobility (R26.89);Muscle weakness (generalized) (M62.81);Difficulty in walking, not elsewhere classified (R26.2);Other symptoms and signs involving the nervous system (R29.898);Hemiplegia and hemiparesis Hemiplegia - Right/Left: Right Hemiplegia - dominant/non-dominant: Dominant Hemiplegia - caused by: Cerebral infarction    Time: 5397-6734 PT Time Calculation (min) (ACUTE ONLY): 40 min   Charges:   PT Evaluation $PT Eval Moderate Complexity: 1 Mod PT Treatments $Therapeutic Activity: 8-22 mins        Moishe Spice, PT, DPT Acute Rehabilitation Services  Pager: [phone] Office: [phone]   Orvan Falconer 12/18/2020, 3:31 PM

## 2020-12-18 NOTE — Progress Notes (Signed)
Initial Nutrition Assessment  DOCUMENTATION CODES:  Non-severe (moderate) malnutrition in context of chronic illness  INTERVENTION:  Add Ensure Enlive po TID, each supplement provides 350 kcal and 20 grams of protein.  Add Magic cup TID with meals, each supplement provides 290 kcal and 9 grams of protein.  Add MVI with minerals daily.  If PO intake is poor over the weekend, consider placing Cortrak on Monday to meet nutrition needs.  NUTRITION DIAGNOSIS:  Moderate Malnutrition related to chronic illness as evidenced by mild fat depletion,moderate fat depletion,mild muscle depletion,moderate muscle depletion,severe muscle depletion,percent weight loss.  GOAL:  Patient will meet greater than or equal to 90% of their needs  MONITOR:  PO intake,Supplement acceptance,Labs,Weight trends,Skin,I & O's  REASON FOR ASSESSMENT:  Consult Assessment of nutrition requirement/status  ASSESSMENT:  55 yo male with a PMH of T2DM, HTN, HL, seizures, polysubstance abuse, Bipolar 1 disorder, anxiety/depression and schizophrenia who presents with acute ischemic L MCA stroke. 5/18 - presented to [facility] ED 5/19 - transfer to [facility]  Attempted to speak with pt at bedside. Pt was very tired - minimal interaction with RD. RD suspects pt just wanted RD to leave, so he reported not having any issues eating at home, no weight loss, and no changes in appetite.  Per Epic, pt has lost about 15 lbs (9.8%) in about 2 months, which is significant and severe for the time frame. Wt history looks questionable as it appears weight has been copied on several occasions.   On exam, pt has moderate depletions. Suspect pt has acute on chronic malnutrition.   Recommend adding Ensure Enlive TID, Magic Cup TID, and MVI with minerals daily.  If pt's PO intake is poor over the weekend, consider placement of Cortrak tube on Monday to help with intake given stroke.  Medications: reviewed; SSI, Protonix, IVF @ 40 ml/hr  Labs:  reviewed; CBG 88-150, Mag 2.8 HbA1c: 9.7% (11/2020)  NUTRITION - FOCUSED PHYSICAL EXAM: Flowsheet Row Most Recent Value  Orbital Region Moderate depletion  Upper Arm Region Mild depletion  Thoracic and Lumbar Region No depletion  Buccal Region Moderate depletion  Temple Region Severe depletion  Clavicle Bone Region Moderate depletion  Clavicle and Acromion Bone Region Moderate depletion  Scapular Bone Region Unable to assess  Dorsal Hand Moderate depletion  Patellar Region Moderate depletion  Anterior Thigh Region Moderate depletion  Posterior Calf Region Moderate depletion  Edema (RD Assessment) None  Hair Reviewed  Eyes Reviewed  Mouth Reviewed  Skin Reviewed  Nails Reviewed     Diet Order:   Diet Order            Diet heart healthy/carb modified Room service appropriate? Yes with Assist; Fluid consistency: Thin  Diet effective now                EDUCATION NEEDS:  Education needs have been addressed  Skin:  Skin Assessment: Skin Integrity Issues: Skin Integrity Issues:: Incisions Incisions: R anterior proximal groin  Last BM:  PTA/unknown  Height:  Ht Readings from Last 1 Encounters:  12/16/20 5\' 7"  (1.702 m)   Weight:  Wt Readings from Last 1 Encounters:  12/17/20 61.3 kg   Ideal Body Weight:  67.3 kg  BMI:  Body mass index is 21.17 kg/m.  Estimated Nutritional Needs:  Kcal:  2100-2300 Protein:  95-110 grams Fluid:  >2 L  Derrel Nip, RD, LDN Registered Dietitian After Hours/Weekend Pager # in [place]

## 2020-12-18 NOTE — Progress Notes (Signed)
Referring Physician(s): Code stroke- Bhagat, Srishti L (neurology)  Supervising Physician: Julieanne Cotton  Patient Status:  [facility] - In-pt  Chief Complaint:  History of acute CVA s/p cerebral arteriogram with emergent mechanical thrombectomy of left MCA M1 occlusion using mechanical thrombectomy and rescue stenting via right femoral approach 12/17/2020 by Dr. Corliss Skains.  Subjective:  Patient laying in bed resting comfortably. He responds to voice and follows simple commands. Oriented to self only. With mild expressive aphasia- improved since yesterday. Moving all extremities. Right femoral puncture site c/d/i.   Allergies: Aspirin  Medications: Prior to Admission medications   Medication Sig Start Date End Date Taking? Authorizing Provider  albuterol (PROVENTIL HFA;VENTOLIN HFA) 108 (90 Base) MCG/ACT inhaler Inhale 2 puffs into the lungs every 4 (four) hours as needed for wheezing or shortness of breath.    Yes [provider]  amLODipine (NORVASC) 10 MG tablet Take 1 tablet (10 mg total) by mouth daily. 04/14/20  Yes Netta Neat., NP  atorvastatin (LIPITOR) 40 MG tablet Take 40 mg by mouth daily.   Yes [provider]  baclofen (LIORESAL) 10 MG tablet Take 10 mg by mouth 2 (two) times daily. 02/18/20  Yes [provider]  clopidogrel (PLAVIX) 75 MG tablet Take 75 mg by mouth daily.   Yes [provider]  cycloSPORINE (RESTASIS) 0.05 % ophthalmic emulsion Place 1 drop into both eyes 2 (two) times daily.   Yes [provider]  diazepam (VALIUM) 5 MG tablet Take 5 mg by mouth 2 (two) times daily as needed for anxiety.    Yes [provider]  divalproex (DEPAKOTE) 500 MG DR tablet Take 3 tablets (1,500 mg total) by mouth 2 (two) times daily. 06/02/20  Yes Penumalli, Vikram R, MD  escitalopram (LEXAPRO) 20 MG tablet Take 20 mg by mouth daily.    Yes [provider]  ibuprofen (ADVIL) 800 MG tablet Take 800 mg by  mouth every 6 (six) hours as needed for moderate pain. 11/17/20  Yes [provider]  insulin aspart (NOVOLOG) 100 UNIT/ML FlexPen Inject 4-10 Units into the skin 3 (three) times daily with meals. Patient taking differently: Inject 4 Units into the skin in the morning, at noon, in the evening, and at bedtime. 09/09/20  Yes Nida, Denman George, MD  insulin glargine (LANTUS SOLOSTAR) 100 UNIT/ML Solostar Pen Inject 40 Units into the skin at bedtime. 11/16/20  Yes Roma Kayser, MD  lamoTRIgine (LAMICTAL) 25 MG tablet Take 50 mg by mouth 2 (two) times daily. 05/20/19  Yes [provider]  lisinopril (ZESTRIL) 20 MG tablet TAKE ONE TABLET BY MOUTH ONCE DAILY. Patient taking differently: Take 20 mg by mouth daily. 10/12/20  Yes Strader, Lennart Pall, PA-C  Melatonin 10 MG CAPS Take 10 capsules by mouth at bedtime as needed (for sleep). 06/12/19  Yes [provider]  nitroGLYCERIN (NITROSTAT) 0.4 MG SL tablet Place 1 tablet (0.4 mg total) under the tongue every 5 (five) minutes as needed for chest pain. 01/15/20 04/14/20 Yes Laqueta Linden, MD  OLANZapine (ZYPREXA) 5 MG tablet Take 5 mg by mouth in the morning and at bedtime.   Yes [provider]  oxyCODONE-acetaminophen (PERCOCET) 5-325 MG tablet Take 1-2 tablets by mouth every 4 (four) hours as needed for severe pain. 11/09/20  Yes Candelaria Stagers, DPM  ranolazine (RANEXA) 500 MG 12 hr tablet Take 1 tablet (500 mg total) by mouth 2 (two) times daily. 10/09/20  Yes Strader, Lennart Pall, PA-C  risperiDONE (RISPERDAL) 3 MG tablet Take 3 mg by mouth at bedtime. 08/16/19  Yes [provider]  isosorbide mononitrate (IMDUR) 30 MG 24 hr tablet Take 1 tablet (30 mg total) by mouth daily. Patient not taking: Reported on 12/17/2020 09/14/20   Hayden Rasmussen, MD  paliperidone (INVEGA) 6 MG 24 hr tablet Take 12 mg by mouth daily. Patient not taking: Reported on 12/17/2020  01/19/21  [provider]      Vital Signs: BP (!) 156/99   Pulse (!) 51   Temp 97.8 F (36.6 C) (Axillary)   Resp 14   Ht 5\' 7"  (1.702 m)   Wt 135 lb 2.3 oz (61.3 kg)   SpO2 99%   BMI 21.17 kg/m   Physical Exam Vitals and nursing note reviewed.  Constitutional:      General: He is not in acute distress. Pulmonary:     Effort: Pulmonary effort is normal. No respiratory distress.  Skin:    General: Skin is warm and dry.     Comments: Right femoral puncture site soft with mild tenderness, no active bleeding or hematoma.  Neurological:     Mental Status: He is alert.     Comments: Alert, awake, and oriented to person only. Follows simple commands. Mild expressive aphasia- improved from yesterday. PERRL bilaterally. Moving all extremities. Distal pulses (DPs) palpable bilaterally.     Imaging: CT HEAD WO CONTRAST  Result Date: 12/17/2020 CLINICAL DATA:  55 year old male code stroke presentation with left MCA M1 ELVO status post endovascular reperfusion. EXAM: CT HEAD WITHOUT CONTRAST TECHNIQUE: Contiguous axial images were obtained from the base of the skull through the vertex without intravenous contrast. COMPARISON:  CTA, CTP and CT head yesterday. FINDINGS: Brain: Pseudo normalization of the left inferior frontal gyrus which was hypodense at presentation yesterday (series 3, image 19). Trace subarachnoid hemorrhage in the left central sulcus on series 3, image 24. Trace contrast staining suspected in the left inferior basal ganglia series 3, image 15. And questionable additional mild gyral contrast staining in the anterior left MCA territory. Scattered areas of subcortical and central white matter hypodensity in the left frontal lobe are otherwise stable to decreased in conspicuity (series 3, image 22). But there is no confluent cytotoxic edema evident, and no intracranial mass effect or midline shift. No ventriculomegaly. No other intracranial hemorrhage identified. Vascular: Left MCA M1 region vascular  stent is new since presentation. Skull: No acute osseous abnormality identified. Sinuses/Orbits: Trace fluid and mucosal thickening in the paranasal sinuses now. Mastoids and tympanic cavities remain clear. Other: Intubated. Fluid in the nasopharynx. Negative orbit and scalp soft tissues. IMPRESSION: 1. Some pseudo-normalization of the left inferior frontal gyrus hypodensity since the presentation CT yesterday which might be related to contrast staining. But there is otherwise little to no cytotoxic edema evident in the Left MCA territory. 2. Trace subarachnoid hemorrhage in the left hemisphere. No malignant hemorrhagic transformation or intracranial mass effect. 3. New left MCA M1 stent. Electronically Signed   By: Genevie Ann M.D.   On: 12/17/2020 06:15   MR ANGIO HEAD WO CONTRAST  Result Date: 12/17/2020 CLINICAL DATA:  Stroke, follow-up. EXAM: MRI HEAD WITHOUT CONTRAST MRA HEAD WITHOUT CONTRAST TECHNIQUE: Multiplanar, multi-echo pulse sequences of the brain and surrounding structures were acquired without intravenous contrast. Angiographic images of the Circle of Willis were acquired using MRA technique without intravenous contrast. COMPARISON: No pertinent prior exam. COMPARISON:  Noncontrast head CT 12/16/2020. CT angiogram head/neck and CT perfusion 12/16/2020. FINDINGS: MRI  HEAD FINDINGS Brain: Mild intermittent motion degradation. Mild cerebral and cerebellar atrophy. Scattered acute infarcts within the left MCA and MCA/PCA watershed territories affecting the cortical/subcortical left frontal, parietal and occipital lobes. Multiple acute infarcts are also present within the left insula and left basal ganglia. Suspected small chronic cortical infarct within the right occipital lobe (series 8, image 10). No evidence of intracranial mass. No chronic intracranial blood products. No extra-axial fluid collection. No midline shift. Vascular: Expected proximal arterial flow voids. SWI signal loss along the course  of the M1 and M2 left MCA likely reflecting stent artifact. Skull and upper cervical spine: No focal marrow lesion. Sinuses/Orbits: Visualized orbits show no acute finding. Trace bilateral ethmoid and maxillary sinus mucosal thickening. MRA HEAD FINDINGS Anterior circulation: The intracranial internal carotid arteries are patent. Mild stent artifact from a stent within the M1/M2 left MCA. The M1 left middle cerebral artery is now patent. Atherosclerotic irregularity of the M2 and more distal left MCA vessels. Most notably, there are sites of apparent high-grade stenosis within an inferior division proximal left M2 vessel. No proximal left M2 branch occlusion is identified. The M1 right MCA is patent. Redemonstrated moderate stenosis within the proximal right M 1 segment. No right M2 proximal branch occlusion or high-grade proximal stenosis. The anterior cerebral arteries are patent. No intracranial aneurysm is identified. Posterior circulation: The intracranial vertebral arteries are patent. The basilar artery is patent. The posterior cerebral arteries are patent. Redemonstrated atherosclerotic irregularity of the bilateral posterior cerebral arteries, most notably as follows. Multifocal severe stenoses within the P2 right PCA. Sites of up to moderate/severe stenosis within the P2 left PCA. Posterior communicating arteries are hypoplastic or absent bilaterally. Anatomic variants: As described IMPRESSION: MRI brain: 1. Scattered acute infarcts within the left MCA and MCA/PCA watershed territories affecting the cortical/subcortical left frontal, parietal and occipital lobes. Multiple acute infarcts are also present within the left insula and basal ganglia. No significant mass effect. No hemorrhagic conversion. 2. Suspected small chronic cortical infarct within the right occipital lobe. 3. Mild generalized parenchymal atrophy. MRA head: 1. Mild artifact arising from a stent within the M1/M2 left MCA. The M1 left MCA is  now patent. Atherosclerotic irregularity of the M2 and more distal left MCA vessels. Most notably, there is apparent high-grade stenosis within an inferior division proximal left M2 vessel. 2. Redemonstrated moderate stenosis within the proximal M1 right MCA. 3. Redemonstrated sites of severe stenosis within the P2 right PCA. 4. Redemonstrated sites of up to moderate/severe stenosis within the P2 left PCA. Electronically Signed   By: Kellie Simmering DO   On: 12/17/2020 16:01   MR BRAIN WO CONTRAST  Result Date: 12/17/2020 CLINICAL DATA:  Stroke, follow-up. EXAM: MRI HEAD WITHOUT CONTRAST MRA HEAD WITHOUT CONTRAST TECHNIQUE: Multiplanar, multi-echo pulse sequences of the brain and surrounding structures were acquired without intravenous contrast. Angiographic images of the Circle of Willis were acquired using MRA technique without intravenous contrast. COMPARISON: No pertinent prior exam. COMPARISON:  Noncontrast head CT 12/16/2020. CT angiogram head/neck and CT perfusion 12/16/2020. FINDINGS: MRI HEAD FINDINGS Brain: Mild intermittent motion degradation. Mild cerebral and cerebellar atrophy. Scattered acute infarcts within the left MCA and MCA/PCA watershed territories affecting the cortical/subcortical left frontal, parietal and occipital lobes. Multiple acute infarcts are also present within the left insula and left basal ganglia. Suspected small chronic cortical infarct within the right occipital lobe (series 8, image 10). No evidence of intracranial mass. No chronic intracranial blood products. No extra-axial fluid collection. No midline  shift. Vascular: Expected proximal arterial flow voids. SWI signal loss along the course of the M1 and M2 left MCA likely reflecting stent artifact. Skull and upper cervical spine: No focal marrow lesion. Sinuses/Orbits: Visualized orbits show no acute finding. Trace bilateral ethmoid and maxillary sinus mucosal thickening. MRA HEAD FINDINGS Anterior circulation: The  intracranial internal carotid arteries are patent. Mild stent artifact from a stent within the M1/M2 left MCA. The M1 left middle cerebral artery is now patent. Atherosclerotic irregularity of the M2 and more distal left MCA vessels. Most notably, there are sites of apparent high-grade stenosis within an inferior division proximal left M2 vessel. No proximal left M2 branch occlusion is identified. The M1 right MCA is patent. Redemonstrated moderate stenosis within the proximal right M 1 segment. No right M2 proximal branch occlusion or high-grade proximal stenosis. The anterior cerebral arteries are patent. No intracranial aneurysm is identified. Posterior circulation: The intracranial vertebral arteries are patent. The basilar artery is patent. The posterior cerebral arteries are patent. Redemonstrated atherosclerotic irregularity of the bilateral posterior cerebral arteries, most notably as follows. Multifocal severe stenoses within the P2 right PCA. Sites of up to moderate/severe stenosis within the P2 left PCA. Posterior communicating arteries are hypoplastic or absent bilaterally. Anatomic variants: As described IMPRESSION: MRI brain: 1. Scattered acute infarcts within the left MCA and MCA/PCA watershed territories affecting the cortical/subcortical left frontal, parietal and occipital lobes. Multiple acute infarcts are also present within the left insula and basal ganglia. No significant mass effect. No hemorrhagic conversion. 2. Suspected small chronic cortical infarct within the right occipital lobe. 3. Mild generalized parenchymal atrophy. MRA head: 1. Mild artifact arising from a stent within the M1/M2 left MCA. The M1 left MCA is now patent. Atherosclerotic irregularity of the M2 and more distal left MCA vessels. Most notably, there is apparent high-grade stenosis within an inferior division proximal left M2 vessel. 2. Redemonstrated moderate stenosis within the proximal M1 right MCA. 3. Redemonstrated  sites of severe stenosis within the P2 right PCA. 4. Redemonstrated sites of up to moderate/severe stenosis within the P2 left PCA. Electronically Signed   By: Kellie Simmering DO   On: 12/17/2020 16:01   CT CEREBRAL PERFUSION W CONTRAST  Result Date: 12/16/2020 CLINICAL DATA:  Right-sided deficits EXAM: CT ANGIOGRAPHY HEAD AND NECK CT PERFUSION BRAIN TECHNIQUE: Multidetector CT imaging of the head and neck was performed using the standard protocol during bolus administration of intravenous contrast. Multiplanar CT image reconstructions and MIPs were obtained to evaluate the vascular anatomy. Carotid stenosis measurements (when applicable) are obtained utilizing NASCET criteria, using the distal internal carotid diameter as the denominator. Multiphase CT imaging of the brain was performed following IV bolus contrast injection. Subsequent parametric perfusion maps were calculated using RAPID software. CONTRAST:  134mL OMNIPAQUE IOHEXOL 350 MG/ML SOLN COMPARISON:  None. FINDINGS: CTA NECK FINDINGS SKELETON: There is no bony spinal canal stenosis. No lytic or blastic lesion. OTHER NECK: Normal pharynx, larynx and major salivary glands. No cervical lymphadenopathy. Unremarkable thyroid gland. UPPER CHEST: No pneumothorax or pleural effusion. No nodules or masses. AORTIC ARCH: There is no calcific atherosclerosis of the aortic arch. There is no aneurysm, dissection or hemodynamically significant stenosis of the visualized portion of the aorta. Conventional 3 vessel aortic branching pattern. The visualized proximal subclavian arteries are widely patent. RIGHT CAROTID SYSTEM: Normal without aneurysm, dissection or stenosis. LEFT CAROTID SYSTEM: Normal without aneurysm, dissection or stenosis. VERTEBRAL ARTERIES: Left dominant configuration. Both origins are clearly patent. There is no dissection,  occlusion or flow-limiting stenosis to the skull base (V1-V3 segments). CTA HEAD FINDINGS POSTERIOR CIRCULATION: --Vertebral  arteries: Normal V4 segments. --Inferior cerebellar arteries: Normal. --Basilar artery: Normal. --Superior cerebellar arteries: Normal. --Posterior cerebral arteries (PCA): Multifocal moderate-to-severe stenosis of the right P2 segment. Normal left. ANTERIOR CIRCULATION: --Intracranial internal carotid arteries: Normal. --Anterior cerebral arteries (ACA): Normal. Both A1 segments are present. Patent anterior communicating artery (a-comm). --Middle cerebral arteries (MCA): Complete occlusion of the left MCA at the proximal M1 segment with limited collateralization. Right MCA is normal. VENOUS SINUSES: As permitted by contrast timing, patent. ANATOMIC VARIANTS: None Review of the MIP images confirms the above findings. CT Brain Perfusion Findings: ASPECTS: 9 CBF (<30%) Volume: 59mL Perfusion (Tmax>6.0s) volume: 159mL Mismatch Volume: 148mL, likely slightly overestimated due to small area of completed infarction seen on the noncontrast head CT. Infarction Location:Left MCA territory IMPRESSION: 1. 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 2. Large area of ischemic penumbra in the left MCA territory. 3. 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 Critical Value/emergent results were called by telephone at the time of interpretation on 12/16/2020 at 9:02 pm to provider Christ Kick , who verbally acknowledged these results. Aortic Atherosclerosis (ICD10-I70.0). Electronically Signed   By: Ulyses Jarred M.D.   On: 12/16/2020 21:08   DG CHEST PORT 1 VIEW  Result Date: 12/17/2020 CLINICAL DATA:  Intubation. EXAM: PORTABLE CHEST 1 VIEW COMPARISON:  12/12/2020. FINDINGS: Endotracheal tube tip noted 4 cm above the carina. NG tube tip below left hemidiaphragm. Prior CABG. Heart size normal. Low lung volumes. Mild left base atelectasis/infiltrate cannot be excluded. Mild elevation left  hemidiaphragm. No pleural effusion or pneumothorax. Prior cervical spine fusion. IMPRESSION: 1.  Endotracheal tube and NG tube in good anatomic position. 2.  Prior CABG.  Heart size normal. 3. Mild left base atelectasis/infiltrate cannot be excluded. Mild elevation left hemidiaphragm. Electronically Signed   By: Marcello Moores  Register   On: 12/17/2020 05:18   EEG adult  Result Date: 12/17/2020 Lora Havens, MD     12/17/2020  1:00 PM Patient Name: ANOOP HEMMER MRN: [id number] Epilepsy Attending: Lora Havens Referring Physician/Provider: Dr. 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  EEG to evaluate for seizures. Level of alertness: Awake AEDs during EEG study: Lamotrigine, Depakote Technical aspects: This EEG study was done with scalp electrodes positioned according to the 10-20 International system of electrode placement. Electrical activity was acquired at a sampling rate of 500Hz  and reviewed with a high frequency filter of 70Hz  and a low frequency filter of 1Hz . EEG data were recorded continuously and digitally stored. Description: The posterior dominant rhythm consists of 8 Hz activity of moderate voltage (25-35 uV) seen predominantly in posterior head regions, symmetric and reactive to eye opening and eye closing. EEG showed intermittent 3 to 6 Hz theta-delta slowing in left hemisphere.  Photic driving was not seen during photic stimulation. Hyperventilation was not performed.   ABNORMALITY - Intermittent slow, left hemisphere IMPRESSION: This study is suggestive of cortical dysfunction arising from left hemisphere, nonspecific etiology. No seizures or epileptiform discharges were seen throughout the recording. Lora Havens   ECHOCARDIOGRAM COMPLETE  Result Date: 12/17/2020    ECHOCARDIOGRAM REPORT   Patient Name:   MEL LANGAN  Date of Exam: 12/17/2020 Medical Rec #:  [id number]         Height:       67.0 in Accession #:    [id number]        Weight:       157.0 lb Date of Birth:  08/01/1966          BSA:          1.825 m Patient Age:    55 years          BP:           96/70 mmHg Patient Gender: M                 HR:           60 bpm. Exam Location:  Inpatient Procedure: 2D Echo, Cardiac Doppler and Color Doppler Indications:    CVA  History:        Patient has prior history of Echocardiogram examinations, most                 recent 03/04/2019. Previous Myocardial Infarction and CAD, Prior                 CABG, Signs/Symptoms:Shortness of Breath; Risk Factors:Diabetes.                 03/28/2016 CABG                 03/07/2016 cath.  Sonographer:    Luisa Hart RDCS Referring Phys: [id number] Lorenza Chick  Sonographer Comments: Echo performed with patient supine and on artificial respirator. IMPRESSIONS  1. Left ventricular ejection fraction, by estimation, is 65 to 70%. The left ventricle has normal function. The left ventricle has no regional wall motion abnormalities. There is moderate left ventricular hypertrophy. Left ventricular diastolic parameters are consistent with Grade I diastolic dysfunction (impaired relaxation).  2. Right ventricular systolic function is normal. The right ventricular size is normal. There is normal pulmonary artery systolic pressure. The estimated right ventricular systolic pressure is Q000111Q mmHg.  3. The mitral valve is normal in structure. Trivial mitral valve regurgitation. No evidence of mitral stenosis.  4. The aortic valve is normal in structure. Aortic valve regurgitation is not visualized. No aortic stenosis is present.  5. Aortic dilatation noted. There is borderline dilatation of the ascending aorta, measuring 38 mm.  6. The inferior vena cava is normal in size with greater than 50% respiratory variability, suggesting right atrial pressure of 3 mmHg. Conclusion(s)/Recommendation(s): No intracardiac source  of embolism detected on this transthoracic study. A transesophageal echocardiogram is recommended to exclude cardiac source of embolism if clinically indicated. FINDINGS  Left Ventricle: Left ventricular ejection fraction, by estimation, is 65 to 70%. The left ventricle has normal function. The left ventricle has no regional wall motion abnormalities. The left ventricular internal cavity size was normal in size. There is  moderate left ventricular hypertrophy. Left ventricular diastolic parameters are consistent with Grade I diastolic dysfunction (impaired relaxation). Right Ventricle: The right ventricular size is normal. No increase in right ventricular wall thickness. Right ventricular systolic function is normal. There is normal pulmonary artery systolic pressure. The tricuspid regurgitant velocity is 2.14 m/s, and  with an assumed right atrial pressure of 3 mmHg, the estimated right ventricular systolic pressure is Q000111Q mmHg. Left Atrium: Left atrial size was normal in size. Right Atrium: Right atrial size was normal in size. Pericardium: There is no evidence of pericardial effusion. Mitral Valve: The mitral valve is normal in structure. Trivial mitral  valve regurgitation. No evidence of mitral valve stenosis. MV peak gradient, 8.0 mmHg. The mean mitral valve gradient is 3.0 mmHg. Tricuspid Valve: The tricuspid valve is normal in structure. Tricuspid valve regurgitation is mild . No evidence of tricuspid stenosis. Aortic Valve: The aortic valve is normal in structure. Aortic valve regurgitation is not visualized. No aortic stenosis is present. Aortic valve mean gradient measures 3.0 mmHg. Aortic valve peak gradient measures 6.2 mmHg. Aortic valve area, by VTI measures 3.55 cm. Pulmonic Valve: The pulmonic valve was thickened with good excursion. Pulmonic valve regurgitation is mild. No evidence of pulmonic stenosis. Aorta: Aortic dilatation noted. There is borderline dilatation of the ascending aorta,  measuring 38 mm. Venous: The inferior vena cava is normal in size with greater than 50% respiratory variability, suggesting right atrial pressure of 3 mmHg. IAS/Shunts: There is redundancy of the interatrial septum. No atrial level shunt detected by color flow Doppler.  LEFT VENTRICLE PLAX 2D LVIDd:         3.50 cm  Diastology LVIDs:         1.70 cm  LV e' medial:    7.40 cm/s LV PW:         1.50 cm  LV E/e' medial:  11.9 LV IVS:        1.50 cm  LV e' lateral:   9.36 cm/s LVOT diam:     2.20 cm  LV E/e' lateral: 9.4 LV SV:         95 LV SV Index:   52 LVOT Area:     3.80 cm  RIGHT VENTRICLE RV Basal diam:  4.30 cm RV Mid diam:    2.60 cm LEFT ATRIUM             Index       RIGHT ATRIUM           Index LA diam:        3.20 cm 1.75 cm/m  RA Area:     11.60 cm LA Vol (A2C):   24.9 ml 13.65 ml/m RA Volume:   26.10 ml  14.30 ml/m LA Vol (A4C):   33.7 ml 18.47 ml/m LA Biplane Vol: 30.6 ml 16.77 ml/m  AORTIC VALVE                   PULMONIC VALVE AV Area (Vmax):    3.25 cm    PV Vmax:       0.76 m/s AV Area (Vmean):   3.28 cm    PV Vmean:      50.700 cm/s AV Area (VTI):     3.55 cm    PV VTI:        0.159 m AV Vmax:           125.00 cm/s PV Peak grad:  2.3 mmHg AV Vmean:          83.600 cm/s PV Mean grad:  1.0 mmHg AV VTI:            0.269 m AV Peak Grad:      6.2 mmHg AV Mean Grad:      3.0 mmHg LVOT Vmax:         107.00 cm/s LVOT Vmean:        72.200 cm/s LVOT VTI:          0.251 m LVOT/AV VTI ratio: 0.93  AORTA Ao Root diam: 3.70 cm Ao Asc diam:  3.80 cm MITRAL VALVE  TRICUSPID VALVE MV Area (PHT): 2.76 cm     TR Peak grad:   18.3 mmHg MV Area VTI:   2.07 cm     TR Vmax:        214.00 cm/s MV Peak grad:  8.0 mmHg MV Mean grad:  3.0 mmHg     SHUNTS MV Vmax:       1.41 m/s     Systemic VTI:  0.25 m MV Vmean:      80.8 cm/s    Systemic Diam: 2.20 cm MV Decel Time: 275 msec MV E velocity: 88.10 cm/s MV A velocity: 109.00 cm/s MV E/A ratio:  0.81 Cherlynn Kaiser MD Electronically signed by Cherlynn Kaiser MD Signature Date/Time: 12/17/2020/1:53:18 PM    Final    CT HEAD CODE STROKE WO CONTRAST  Result Date: 12/16/2020 CLINICAL DATA:  Code stroke.  Right-sided deficits EXAM: CT HEAD WITHOUT CONTRAST TECHNIQUE: Contiguous axial images were obtained from the base of the skull through the vertex without intravenous contrast. COMPARISON:  12/12/2020 FINDINGS: Brain: There is no mass, hemorrhage or extra-axial collection. The size and configuration of the ventricles and extra-axial CSF spaces are normal. Area of hypoattenuation in the inferior left frontal lobe, likely early subacute infarct. Small vessel infarcts in the left corona radiata are unchanged. Vascular: No abnormal hyperdensity of the major intracranial arteries or dural venous sinuses. No intracranial atherosclerosis. Skull: The visualized skull base, calvarium and extracranial soft tissues are normal. Sinuses/Orbits: No fluid levels or advanced mucosal thickening of the visualized paranasal sinuses. No mastoid or middle ear effusion. The orbits are normal. ASPECTS [facility] Stroke Program Early CT Score) - Ganglionic level infarction (caudate, lentiform nuclei, internal capsule, insula, M1-M3 cortex): 6 - Supraganglionic infarction (M4-M6 cortex): 3 Total score (0-10 with 10 being normal): 9 IMPRESSION: 1. No acute intracranial hemorrhage. 2. Suspected early subacute infarct of the inferior left frontal lobe. 3. ASPECTS is 9. These results were called by telephone at the time of interpretation on 12/16/2020 at 8:25 pm to provider [facility] , who verbally acknowledged these results. Electronically Signed   By: Ulyses Jarred M.D.   On: 12/16/2020 20:26   CT ANGIO HEAD CODE STROKE  Result Date: 12/16/2020 CLINICAL DATA:  Right-sided deficits EXAM: CT ANGIOGRAPHY HEAD AND NECK CT PERFUSION BRAIN TECHNIQUE: Multidetector CT imaging of the head and neck was performed using the standard protocol during bolus administration of intravenous contrast.  Multiplanar CT image reconstructions and MIPs were obtained to evaluate the vascular anatomy. Carotid stenosis measurements (when applicable) are obtained utilizing NASCET criteria, using the distal internal carotid diameter as the denominator. Multiphase CT imaging of the brain was performed following IV bolus contrast injection. Subsequent parametric perfusion maps were calculated using RAPID software. CONTRAST:  142mL OMNIPAQUE IOHEXOL 350 MG/ML SOLN COMPARISON:  None. FINDINGS: CTA NECK FINDINGS SKELETON: There is no bony spinal canal stenosis. No lytic or blastic lesion. OTHER NECK: Normal pharynx, larynx and major salivary glands. No cervical lymphadenopathy. Unremarkable thyroid gland. UPPER CHEST: No pneumothorax or pleural effusion. No nodules or masses. AORTIC ARCH: There is no calcific atherosclerosis of the aortic arch. There is no aneurysm, dissection or hemodynamically significant stenosis of the visualized portion of the aorta. Conventional 3 vessel aortic branching pattern. The visualized proximal subclavian arteries are widely patent. RIGHT CAROTID SYSTEM: Normal without aneurysm, dissection or stenosis. LEFT CAROTID SYSTEM: Normal without aneurysm, dissection or stenosis. VERTEBRAL ARTERIES: Left dominant configuration. Both origins are clearly patent. There is no dissection, occlusion or flow-limiting  stenosis to the skull base (V1-V3 segments). CTA HEAD FINDINGS POSTERIOR CIRCULATION: --Vertebral arteries: Normal V4 segments. --Inferior cerebellar arteries: Normal. --Basilar artery: Normal. --Superior cerebellar arteries: Normal. --Posterior cerebral arteries (PCA): Multifocal moderate-to-severe stenosis of the right P2 segment. Normal left. ANTERIOR CIRCULATION: --Intracranial internal carotid arteries: Normal. --Anterior cerebral arteries (ACA): Normal. Both A1 segments are present. Patent anterior communicating artery (a-comm). --Middle cerebral arteries (MCA): Complete occlusion of the left  MCA at the proximal M1 segment with limited collateralization. Right MCA is normal. VENOUS SINUSES: As permitted by contrast timing, patent. ANATOMIC VARIANTS: None Review of the MIP images confirms the above findings. CT Brain Perfusion Findings: ASPECTS: 9 CBF (<30%) Volume: 81mL Perfusion (Tmax>6.0s) volume: Mismatch Volume: , likely slightly overestimated due to small area of completed infarction seen on the noncontrast head CT. Infarction Location:Left MCA territory IMPRESSION: 1. 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 2. Large area of ischemic penumbra in the left MCA territory. 3. 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 Critical Value/emergent results were called by telephone at the time of interpretation on 12/16/2020 at 9:02 pm to provider Gray Bernhardt , who verbally acknowledged these results. Aortic Atherosclerosis (ICD10-I70.0). Electronically Signed   By: Deatra Robinson M.D.   On: 12/16/2020 21:08   CT ANGIO NECK CODE STROKE  Result Date: 12/16/2020 CLINICAL DATA:  Right-sided deficits EXAM: CT ANGIOGRAPHY HEAD AND NECK CT PERFUSION BRAIN TECHNIQUE: Multidetector CT imaging of the head and neck was performed using the standard protocol during bolus administration of intravenous contrast. Multiplanar CT image reconstructions and MIPs were obtained to evaluate the vascular anatomy. Carotid stenosis measurements (when applicable) are obtained utilizing NASCET criteria, using the distal internal carotid diameter as the denominator. Multiphase CT imaging of the brain was performed following IV bolus contrast injection. Subsequent parametric perfusion maps were calculated using RAPID software. CONTRAST:  OMNIPAQUE IOHEXOL 350 MG/ML SOLN COMPARISON:  None. FINDINGS: CTA NECK FINDINGS SKELETON: There is no bony spinal canal stenosis. No lytic or blastic  lesion. OTHER NECK: Normal pharynx, larynx and major salivary glands. No cervical lymphadenopathy. Unremarkable thyroid gland. UPPER CHEST: No pneumothorax or pleural effusion. No nodules or masses. AORTIC ARCH: There is no calcific atherosclerosis of the aortic arch. There is no aneurysm, dissection or hemodynamically significant stenosis of the visualized portion of the aorta. Conventional 3 vessel aortic branching pattern. The visualized proximal subclavian arteries are widely patent. RIGHT CAROTID SYSTEM: Normal without aneurysm, dissection or stenosis. LEFT CAROTID SYSTEM: Normal without aneurysm, dissection or stenosis. VERTEBRAL ARTERIES: Left dominant configuration. Both origins are clearly patent. There is no dissection, occlusion or flow-limiting stenosis to the skull base (V1-V3 segments). CTA HEAD FINDINGS POSTERIOR CIRCULATION: --Vertebral arteries: Normal V4 segments. --Inferior cerebellar arteries: Normal. --Basilar artery: Normal. --Superior cerebellar arteries: Normal. --Posterior cerebral arteries (PCA): Multifocal moderate-to-severe stenosis of the right P2 segment. Normal left. ANTERIOR CIRCULATION: --Intracranial internal carotid arteries: Normal. --Anterior cerebral arteries (ACA): Normal. Both A1 segments are present. Patent anterior communicating artery (a-comm). --Middle cerebral arteries (MCA): Complete occlusion of the left MCA at the proximal M1 segment with limited collateralization. Right MCA is normal. VENOUS SINUSES: As permitted by contrast timing, patent. ANATOMIC VARIANTS: None Review of the MIP images confirms the above findings. CT Brain Perfusion Findings: ASPECTS: 9 CBF (<30%) Volume: 78mL Perfusion (Tmax>6.0s) volume: Mismatch Volume: , likely slightly overestimated due to small area of completed infarction seen on the noncontrast head  CT. Infarction Location:Left MCA territory IMPRESSION: 1. Emergent large vessel occlusion of the left middle cerebral artery at the  proximal M1 segment with limited collateralization. 2. Large area of ischemic penumbra in the left MCA territory. 3. 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 Critical Value/emergent results were called by telephone at the time of interpretation on 12/16/2020 at 9:02 pm to provider Christ Kick , who verbally acknowledged these results. Aortic Atherosclerosis (ICD10-I70.0). Electronically Signed   By: Ulyses Jarred M.D.   On: 12/16/2020 21:08    Labs:  CBC: Recent Labs    12/15/20 0929 12/16/20 2028 12/16/20 2038 12/17/20 0237 12/17/20 0254 12/18/20 0429  WBC 3.8* 4.2  --   --  5.7 6.2  HGB 12.8* 12.0* 11.2* 11.9* 11.3* 11.1*  HCT 37.3* 34.5* 33.0* 35.0* 32.8* 31.5*  PLT 213 183  --   --  173 158    COAGS: Recent Labs    12/16/20 2028  INR 1.0  APTT 29    BMP: Recent Labs    02/12/20 0000 02/12/20 1334 05/08/20 0000 08/25/20 0000 08/27/20 0000 12/15/20 0929 12/16/20 2028 12/16/20 2038 12/17/20 0237 12/17/20 0254 12/18/20 0429  NA 141 141  --   --    < > 137 135 139 139 136 138  K 4.5 4.5  --   --    < > 4.6 3.7 3.7 3.5 3.7 3.7  CL 103 103  --   --    < > 103 102 102  --  111 114*  CO2 29* 29  --   --    < > 28 27  --   --  17* 19*  GLUCOSE  --  177*  --   --    < > 157* 175* 159*  --  235* 86  BUN 19 19 17   --    < > 14 22* 21*  --  18 19  CALCIUM 9.0 9.0 8.9 9.0   < > 8.2* 7.7*  --   --  7.2* 7.8*  CREATININE 1.5* 1.51* 1.5*  --    < > 1.33* 2.15* 2.20*  --  1.75* 1.74*  GFRNONAA 51 51* 53 44   < > >60 35*  --   --  45* 45*  GFRAA 59 59* 61 51  --   --   --   --   --   --   --    < > = values in this interval not displayed.    LIVER FUNCTION TESTS: Recent Labs    02/12/20 1334 12/12/20 1123 12/15/20 0929 12/16/20 2028  BILITOT 0.4 0.8 0.7 0.5  AST 15 15 20 16   ALT 13 13 13 12   ALKPHOS 58 42 41 39  PROT 5.4* 5.1* 5.5* 5.1*  ALBUMIN 3.4* 2.7* 2.8* 2.8*    Assessment and Plan:  History of acute CVA  s/p cerebral arteriogram with emergent mechanical thrombectomy of left MCA M1 occlusion using mechanical thrombectomy and rescue stenting via right femoral approach 12/17/2020 by Dr. Estanislado Pandy. Patient awake and alert, oriented to self only, follows simple commands, mild expressive aphasia (improved from yesterday), moving all extremities. Right femoral puncture site stable, distal pulses (DPs) palpable bilaterally. Continue taking Brilinta 90 mg twice daily. Plan to follow-up with Dr. Estanislado Pandy in clinic 2 weeks after discharge (NIR schedulers to call patient to set up this consult). Further plans per neurology/CCM- appreciate and agree with management. NIR to follow.   Electronically Signed:  Earley Abide, PA-C 12/18/2020, 10:14 AM   I spent a total of 15 Minutes at the the patient's bedside AND on the patient's hospital floor or unit, greater than 50% of which was counseling/coordinating care for CVA s/p revascularization.

## 2020-12-18 NOTE — Progress Notes (Signed)
Pt transferred to 3W. Sister [name] made aware. Only belongings include shoes, glasses, underware. [name] [name] has phone and wallet.

## 2020-12-18 NOTE — Progress Notes (Signed)
Inpatient Diabetes Program Recommendations  AACE/ADA: New Consensus Statement on Inpatient Glycemic Control (2015)  Target Ranges:  Prepandial:   less than 140 mg/dL      Peak postprandial:   less than 180 mg/dL (1-2 hours)      Critically ill patients:  140 - 180 mg/dL   Lab Results  Component Value Date   GLUCAP 124 (H) 12/18/2020   HGBA1C 9.7 (H) 12/17/2020    Review of Glycemic Control Results for Melvin Taylor, Melvin Taylor (MRN [id number]) as of 12/18/2020 08:33  Ref. Range 12/17/2020 23:31 12/18/2020 03:30 12/18/2020 07:48 12/18/2020 07:50 12/18/2020 08:18  Glucose-Capillary Latest Ref Range: 70 - 99 mg/dL 150 (H) 88 55 (L) 56 (L) 124 (H)   Inpatient Diabetes Program Recommendations:   Patient sees Dr. Dorris Fetch as endocrinologist and last office visit was 09/23/20. Consider for discharge : -Lantus 6 qd (0.1 units/kg x 61.3 kg With followup with Dr. Dorris Fetch and keep track of [name]. If fasting CBG <100, call Dr. Dorris Fetch for instructions.  Thank you, Nani Gasser. Findley Blankenbaker, RN, MSN, CDE  Diabetes Coordinator Inpatient Glycemic Control Team Team Pager [phone] (8am-5pm) 12/18/2020 8:36 AM

## 2020-12-18 NOTE — Progress Notes (Addendum)
NAME:  Melvin Taylor, MRN:  [id number], DOB:  31-Mar-1966, LOS: 2 ADMISSION DATE:  12/16/2020, CONSULTATION DATE: 12/18/20 REFERRING MD:  [place], CHIEF COMPLAINT:  stroke   History of Present Illness:  Melvin Taylor is a 55 y.o. M with PMH significant for  DM, HTN, HL, seizures, CAD, polysubstance abuse and schizophrenia who presented with R-sided weakness and speech difficulties..  Per records, he has an aide that has been out of town and he subsequently missed some doses of Depakote and had a breakthrough seizure earlier this week.  On 5/18 he was on a fishing trip with his father when he was startled by a snake around 4pm. Shortly thereafter, he was noted to have abnormal speech and R-sided weakness and so presented to [facility] ED.  He was found to have L M1 occlusion and was transferred to [name] for emergent thrombectomy.  There was concern for subacute appearing infarct on initial head CT, therefore did not receive TPA. Blood pressure was as high as 214/119 and he was started on Cleviprex.  He was intubated and PCCM consulted on transfer to neuro ICU.  Pertinent  Medical History   has a past medical history of Anxiety, Bipolar 1 disorder ([name]), Bone spur, Cervical radiculopathy, Chest pain, Chronic back pain, Chronic chest wall pain, Chronic neck pain, Coronary artery disease, Depression, Diabetes mellitus, Hallucinations, Headache(784.0), History of gout, HTN (hypertension), Insomnia, Myocardial infarction ([name]) (2017), Neuropathy, Pain management, Polysubstance abuse ([name]), Right leg pain, Schizophrenia ([name]), Seizures ([name]), Shortness of breath dyspnea, Sleep apnea, and Transfusion of blood product refused for religious reason.   Significant Hospital Events: Including procedures, antibiotic start and stop dates in addition to other pertinent events   . 5/18 Presented to [facility] ED with stroke symptoms . 5/19 Transferred to [facility], taken to [facility] for successful embolectomy and L. M1 stenting.  Repeat CT  without hemorrhage.  Extubated.  Passed swallow eval and restarted on oral and HTN meds, L rad aline removed, foley removed  Interim History / Subjective:   Remains hemodynamically stable.  Cleviprex off 0200 this am. On room air.  Afebrile  RN reports urinary retention s/p I/O x 3 overnight- mostly clear with few blood clots.   Pt reported to RN yesterday he snorts his oxy at home.   Today, no complaints- denies SOB/ pain.  Hypoglycemic this am - poor PO intake since extubation  Objective   Blood pressure 124/66, pulse (!) 53, temperature 98.3 F (36.8 C), temperature source Oral, resp. rate 13, height 5\' 7"  (1.702 m), weight 61.3 kg, SpO2 99 %.      Intake/Output Summary (Last 24 hours) at 12/18/2020 0749 Last data filed at 12/18/2020 5056 Gross per 24 hour  Intake 1361.08 ml  Output 1650 ml  Net -288.92 ml   Filed Weights   12/16/20 2022 12/17/20 1630  Weight: 71.2 kg 61.3 kg   General:  Thin, older male lying in bed in NAD HEENT: MM pink/moist, poor dentition, pupils 3/reactive, anicteric, some slight upper eyelid edema, slight right facial droop, speech clear Neuro: Awake, flat, oriented to person, place, month, and events.  Slight right arm drift/ 4/5, RLE 4/5, LUE/ LLE 5/5 CV: rr, remains SB, no murmur PULM:  On room air, non labored, CTA, strong cough GI: soft, bs +, NT, condom cath   Extremities: warm/dry, no LE edema  Skin: no rashes   Labs/imaging that I havepersonally reviewed  (right click and "Reselect all SmartList Selections" daily)   CBC, BMET, glucose  trend  TTE 5/19- EF 65-70, no wall abnormalities, G1DD, normal RV, mild aortic dilation EEG 5/19- suggestive of cortical dysfunction arising from left hemisphere, nonspecific etiology. 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 Intubated for procedure S/p successful revascularization and  stenting  Per report, was left intubated for poor post-op mental status P: - per Stroke team, primary - continue Brilinta, lipitor  - further imaging per neurology  -  TTE 5/19- EF 65-70, no wall abnormalities, G1DD, normal RV, mild aortic dilation - continue PT/ OT  Acute respiratory insufficiency related to above, resolved - extubated 5/19 - continue aggressive pulm hygiene w/ IS/ PT/ OT  History of Seizure Disorder On Depakote and Lamictal Recently missed doses and had breakthrough seizure prior to admit P: - Primary management per neurology  - Seizure precautions  - AEDs per neurology, continue home lamictal and depakote (valproic acid level 150 this am, to be adjusted per stroke/ pharmacy)  AKI Creatinine 2.2 Baseline 1.2-1.5 in epic P: - continue NS at 40 ml/ hr for now till oral intake improves  - UOP 1.6L / 24 hrs - improving sCr - trend renal indices    HTN Takes Norvasc, Lisinopril, Minipress and Imdur at baseline Last Echo in 2020 LVEF 60-65% and no RH failure On Plavix at home P: - TTE as above - continue norvasc 10 mg daily and imdur 30 mg daily.  Continue to hold lisinopril with improving AKI  Type 2 DM Takes Lantus 40 units at home and novolog TID  P: - hypoglycemic this am  - d/c levemir - reduce SSI to sensitive  - continue CBG q 4 - dietary consult for nutritional needs - consult diabetes coordinator- A1c 9.7  History of Schizophrenia Chronic Pain Depression Polysubstance abuse  On Baclofen, valium and oxy at home, held at this this time, monitor for withdrawal. RN reported patient/ sister confirmed he snorts his oxy at home.   - unclear if patient taking home meds as prescribed, would benefit from f/u psych outpt - zyprexa last filled in March, invega not taking - continue lamictal  - will need possible substance counseling - continue lexapro  Hypomag - repeat Mag this am   Urinary retention s/p foley removal - doesn't appear to be on  home meds  - flomax started per urinary retention protocol x 3 days - continue prn bladder scans, s/p 3 in/out overnight, will require foley if still having retention.  Some reports of blood clots but urine clear, likely from trauma, but at risk of obstructive AKI, watch UOP and renal indices closely.  Might need urology consult.    Given patient's hemodynamic and respiratory stability, PCCM will sign off.  Nothing further to add.  PCCM will sign off.  Please call us back if we can be of any further assistance.   Best practice (right click and "Reselect all SmartList Selections" daily)  Diet:  Oral Pain/Anxiety/Delirium protocol (if indicated): No VAP protocol (if indicated): Not indicated DVT prophylaxis: LMWH GI prophylaxis: PPI Glucose control:  SSI Yes, reduced to sensitive Central venous access:  N/A Arterial line:  N/A Foley:  N/A Mobility:  OOB  PT consulted: Yes Last date of multidisciplinary goals of care discussion [pending] per primary  Code Status:  full code Disposition: ICU- per stroke  Labs   CBC: Recent Labs  Lab 12/12/20 1123 12/15/20 0929  12/16/20 2028 12/16/20 2038 12/17/20 0237 12/17/20 0254 12/18/20 0429  WBC 5.2 3.8* 4.2  --   --  5.7 6.2  NEUTROABS 2.3 1.2* 1.5*  --   --  4.5 4.4  HGB 13.2 12.8* 12.0* 11.2* 11.9* 11.3* 11.1*  HCT 38.4* 37.3* 34.5* 33.0* 35.0* 32.8* 31.5*  MCV 91.6 90.5 91.0  --   --  90.1 89.7  PLT 200 213 183  --   --  173 371    Basic Metabolic Panel: Recent Labs  Lab 12/12/20 1123 12/15/20 0929 12/15/20 1341 12/16/20 2028 12/16/20 2038 12/17/20 0237 12/17/20 0254 12/18/20 0429  NA 138 137  --  135 139 139 136 138  K 5.0 4.6  --  3.7 3.7 3.5 3.7 3.7  CL 104 103  --  102 102  --  111 114*  CO2 29 28  --  27  --   --  17* 19*  GLUCOSE 134* 157*  --  175* 159*  --  235* 86  BUN 17 14  --  22* 21*  --  18 19  CREATININE 1.43* 1.33*  --  2.15* 2.20*  --  1.75* 1.74*  CALCIUM 8.7* 8.2*  --  7.7*  --   --  7.2* 7.8*  MG   --   --  1.7  --   --   --  1.6*  --    GFR: Estimated Creatinine Clearance: 41.1 mL/min (A) (by C-G formula based on SCr of 1.74 mg/dL (H)). Recent Labs  Lab 12/15/20 0929 12/16/20 2028 12/17/20 0254 12/18/20 0429  WBC 3.8* 4.2 5.7 6.2    Liver Function Tests: Recent Labs  Lab 12/12/20 1123 12/15/20 0929 12/16/20 2028  AST 15 20 16   ALT 13 13 12   ALKPHOS 42 41 39  BILITOT 0.8 0.7 0.5  PROT 5.1* 5.5* 5.1*  ALBUMIN 2.7* 2.8* 2.8*   No results for input(s): LIPASE, AMYLASE in the last 168 hours. No results for input(s): AMMONIA in the last 168 hours.  ABG    Component Value Date/Time   PHART 7.402 12/17/2020 0237   PCO2ART 27.4 (L) 12/17/2020 0237   PO2ART 107 12/17/2020 0237   HCO3 17.3 (L) 12/17/2020 0237   TCO2 18 (L) 12/17/2020 0237   ACIDBASEDEF 7.0 (H) 12/17/2020 0237   O2SAT 99.0 12/17/2020 0237     Coagulation Profile: Recent Labs  Lab 12/16/20 2028  INR 1.0    Cardiac Enzymes: No results for input(s): CKTOTAL, CKMB, CKMBINDEX, TROPONINI in the last 168 hours.  HbA1C: Hemoglobin A1C  Date/Time Value Ref Range Status  08/25/2020 12:00 AM 12.7  Final  05/08/2020 12:00 AM 10.4  Final   Hgb A1c MFr Bld  Date/Time Value Ref Range Status  12/17/2020 02:54 AM 9.7 (H) 4.8 - 5.6 % Final    Comment:    (NOTE) Pre diabetes:          5.7%-6.4%  Diabetes:              >6.4%  Glycemic control for   <7.0% adults with diabetes     CBG: Recent Labs  Lab 12/17/20 1600 12/17/20 1951 12/17/20 2331 12/18/20 0330 12/18/20 [place], ACNP [facility] 12/18/2020, 7:50 AM

## 2020-12-18 NOTE — Progress Notes (Signed)
Rehab Admissions Coordinator Note:  Patient was screened by Cleatrice Burke for appropriateness for an Inpatient Acute Rehab Consult per OT recs.  At this time, we are recommending Inpatient Rehab consult. I will place order per protocol.  Cleatrice Burke RN MSN 12/18/2020, 2:52 PM  I can be reached at [phone].

## 2020-12-18 NOTE — Discharge Instructions (Signed)
Femoral Site Care This sheet gives you information about how to care for yourself after your procedure. Your health care provider may also give you more specific instructions. If you have problems or questions, contact your health care provider. What can I expect after the procedure? After the procedure, it is common to have:  Bruising that usually fades within 1-2 weeks.  Tenderness at the site. Follow these instructions at home: Wound care 1. Follow instructions from your health care provider about how to take care of your insertion site. Make sure you: ? Wash your hands with soap and water before you change your bandage (dressing). If soap and water are not available, use hand sanitizer. ? Change your dressing as directed- pressure dressing removed 24 hours post-procedure (and switch for bandaid), bandaid removed 72 hours post-procedure 2. Do not take baths, swim, or use a hot tub for 7 days post-procedure. 3. You may shower 48 hours after the procedure or as told by your health care provider. ? Gently wash the site with plain soap and water. ? Pat the area dry with a clean towel. ? Do not rub the site. This may cause bleeding. 4. Check your site every day for signs of infection. Check for: ? Redness, swelling, or pain. ? Fluid or blood. ? Warmth. ? Pus or a bad smell. Activity  Do not stoop, bend, or lift anything that is heavier than 10 lb (4.5 kg) for 2 weeks post-procedure.  Do not drive self for 2 weeks post-procedure. Contact a health care provider if you have:  A fever or chills.  You have redness, swelling, or pain around your insertion site. Get help right away if:  The catheter insertion area swells very fast.  You pass out.  You suddenly start to sweat or your skin gets clammy.  The catheter insertion area is bleeding, and the bleeding does not stop when you hold steady pressure on the area.  The area near or just beyond the catheter insertion site becomes  pale, cool, tingly, or numb. These symptoms may represent a serious problem that is an emergency. Do not wait to see if the symptoms will go away. Get medical help right away. Call your local emergency services (911 in the [place] Do not drive yourself to the hospital.  This information is not intended to replace advice given to you by your health care provider. Make sure you discuss any questions you have with your health care provider. Document Revised: 07/31/2017 Document Reviewed: 07/31/2017 [employer] Patient Education  2020 [employer] 

## 2020-12-18 NOTE — Progress Notes (Signed)
STROKE TEAM PROGRESS NOTE   SUBJECTIVE (INTERVAL HISTORY) His RN is at the bedside.  Pt sitting in bed and ate breakfast well. Overnight some urinary retention, needed in and out x 2. Cre improving but still not baseline yet, will continue IVF. Pt aphasia much improved with only intermittent paraphasic error and perseveration. Depakote level was high, will hold off morning dose and continue 1000mg  bid, watch for depakote level. Had hypoglycemia reading overnight, has been resolved.    OBJECTIVE Temp:  [97.5 F (36.4 C)-98.3 F (36.8 C)] 97.8 F (36.6 C) (05/20 0800) Pulse Rate:  [48-70] 51 (05/20 0900) Cardiac Rhythm: Normal sinus rhythm (05/19 2000) Resp:  [8-20] 14 (05/20 0900) BP: (108-166)/(54-92) 158/88 (05/20 0900) SpO2:  [96 %-100 %] 99 % (05/20 0900) Arterial Line BP: (120-149)/(65-76) 137/70 (05/19 1235) Weight:  [61.3 kg] 61.3 kg (05/19 1630)  Recent Labs  Lab 12/17/20 2331 12/18/20 0330 12/18/20 0748 12/18/20 0750 12/18/20 0818  GLUCAP 150* 88 55* 56* 124*   Recent Labs  Lab 12/12/20 1123 12/15/20 0929 12/15/20 1341 12/16/20 2028 12/16/20 2038 12/17/20 0237 12/17/20 0254 12/18/20 0429  NA 138 137  --  135 139 139 136 138  K 5.0 4.6  --  3.7 3.7 3.5 3.7 3.7  CL 104 103  --  102 102  --  111 114*  CO2 29 28  --  27  --   --  17* 19*  GLUCOSE 134* 157*  --  175* 159*  --  235* 86  BUN 17 14  --  22* 21*  --  18 19  CREATININE 1.43* 1.33*  --  2.15* 2.20*  --  1.75* 1.74*  CALCIUM 8.7* 8.2*  --  7.7*  --   --  7.2* 7.8*  MG  --   --  1.7  --   --   --  1.6* 2.8*  PHOS  --   --   --   --   --   --   --  4.3   Recent Labs  Lab 12/12/20 1123 12/15/20 0929 12/16/20 2028  AST 15 20 16   ALT 13 13 12   ALKPHOS 42 41 39  BILITOT 0.8 0.7 0.5  PROT 5.1* 5.5* 5.1*  ALBUMIN 2.7* 2.8* 2.8*   Recent Labs  Lab 12/12/20 1123 12/15/20 0929 12/16/20 2028 12/16/20 2038 12/17/20 0237 12/17/20 0254 12/18/20 0429  WBC 5.2 3.8* 4.2  --   --  5.7 6.2  NEUTROABS  2.3 1.2* 1.5*  --   --  4.5 4.4  HGB 13.2 12.8* 12.0* 11.2* 11.9* 11.3* 11.1*  HCT 38.4* 37.3* 34.5* 33.0* 35.0* 32.8* 31.5*  MCV 91.6 90.5 91.0  --   --  90.1 89.7  PLT 200 213 183  --   --  173 158   No results for input(s): CKTOTAL, CKMB, CKMBINDEX, TROPONINI in the last 168 hours. Recent Labs    12/16/20 2028  LABPROT 13.1  INR 1.0   Recent Labs    12/17/20 0254  COLORURINE YELLOW  LABSPEC 1.038*  PHURINE 6.0  GLUCOSEU 50*  HGBUR SMALL*  BILIRUBINUR NEGATIVE  KETONESUR 5*  PROTEINUR 100*  NITRITE NEGATIVE  LEUKOCYTESUR NEGATIVE       Component Value Date/Time   CHOL 224 (H) 12/17/2020 0254   CHOL 286 (H) 02/12/2020 1334   TRIG 146 12/17/2020 0254   HDL 79 12/17/2020 0254   HDL 118 02/12/2020 1334   CHOLHDL 2.8 12/17/2020 0254   VLDL 29 12/17/2020 0254  LDLCALC 116 (H) 12/17/2020 0254   LDLCALC 145 (H) 02/12/2020 1334   Lab Results  Component Value Date   HGBA1C 9.7 (H) 12/17/2020      Component Value Date/Time   LABOPIA NONE DETECTED 12/17/2020 0254   COCAINSCRNUR NONE DETECTED 12/17/2020 0254   LABBENZ NONE DETECTED 12/17/2020 0254   AMPHETMU NONE DETECTED 12/17/2020 0254   THCU POSITIVE (A) 12/17/2020 0254   LABBARB NONE DETECTED 12/17/2020 0254    Recent Labs  Lab 12/15/20 0930 12/16/20 2028  ETH <10 <10    I have personally reviewed the radiological images below and agree with the radiology interpretations.  DG Sacrum/Coccyx  Result Date: 11/30/2020 CLINICAL DATA:  Recent fall with sacral pain, initial encounter EXAM: SACRUM AND COCCYX - 2+ VIEW COMPARISON:  None. FINDINGS: Sacrum is well visualized. Sacral ala are unremarkable. No acute fracture is seen. IMPRESSION: No acute abnormality noted. Electronically Signed   By: Inez Catalina M.D.   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 EXAM: CT HEAD  WITHOUT CONTRAST TECHNIQUE: Contiguous axial images were obtained from the base of the skull through the vertex without intravenous contrast. COMPARISON:  CTA, CTP and CT head yesterday. FINDINGS: Brain: Pseudo normalization of the left inferior frontal gyrus which was hypodense at presentation yesterday (series 3, image 19). Trace subarachnoid hemorrhage in the left central sulcus on series 3, image 24. Trace contrast staining suspected in the left inferior basal ganglia series 3, image 15. And questionable additional mild gyral contrast staining in the anterior left MCA territory. Scattered areas of subcortical and central white matter hypodensity in the left frontal lobe are otherwise stable to decreased in conspicuity (series 3, image 22). But there is no confluent cytotoxic edema evident, and no intracranial mass effect or midline shift. No ventriculomegaly. No other intracranial hemorrhage identified. Vascular: Left MCA M1 region vascular stent is new since presentation. Skull: No acute osseous abnormality identified. Sinuses/Orbits: Trace fluid and mucosal thickening in the paranasal sinuses now. Mastoids and tympanic cavities remain clear. Other: Intubated. Fluid in the nasopharynx. Negative orbit and scalp soft tissues. IMPRESSION: 1. Some pseudo-normalization of the left inferior frontal gyrus hypodensity since the presentation CT yesterday which might be related to contrast staining. But there is otherwise little to no cytotoxic edema evident in the Left MCA territory. 2. Trace subarachnoid hemorrhage in the left hemisphere. No malignant hemorrhagic transformation or intracranial mass effect. 3. New left MCA M1 stent. Electronically Signed   By: Genevie Ann M.D.   On: 12/17/2020 06:15   CT Head Wo Contrast  Result Date: 12/12/2020 CLINICAL DATA:  55 year old with acute onset of headache and possible fever that began yesterday. Current history of hypertension. EXAM: CT HEAD WITHOUT CONTRAST TECHNIQUE:  Contiguous axial images were obtained from the base of the skull through the vertex without intravenous contrast. COMPARISON:  06/09/2018 and earlier. FINDINGS: Brain: Ventricular system normal in size and appearance for age. No mass lesion. No midline shift. No acute hemorrhage or hematoma. No extra-axial fluid collections. No evidence of acute infarction. No interval change. Vascular: Mild BILATERAL carotid siphon atherosclerosis. No hyperdense vessel. Skull: No skull fracture or other focal osseous abnormality involving the skull. Sinuses/Orbits: Visualized paranasal sinuses, bilateral mastoid air cells and bilateral middle ear cavities well-aerated. Visualized orbits and globes normal in appearance. Other: None. IMPRESSION: No acute intracranial abnormality.  Stable examination. Electronically  Signed   By: Evangeline Dakin M.D.   On: 12/12/2020 11:57   MR ANGIO HEAD WO CONTRAST  Result Date: 12/17/2020 CLINICAL DATA:  Stroke, follow-up. EXAM: MRI HEAD WITHOUT CONTRAST MRA HEAD WITHOUT CONTRAST TECHNIQUE: Multiplanar, multi-echo pulse sequences of the brain and surrounding structures were acquired without intravenous contrast. Angiographic images of the Circle of Willis were acquired using MRA technique without intravenous contrast. COMPARISON: No pertinent prior exam. COMPARISON:  Noncontrast head CT 12/16/2020. CT angiogram head/neck and CT perfusion 12/16/2020. FINDINGS: MRI HEAD FINDINGS Brain: Mild intermittent motion degradation. Mild cerebral and cerebellar atrophy. Scattered acute infarcts within the left MCA and MCA/PCA watershed territories affecting the cortical/subcortical left frontal, parietal and occipital lobes. Multiple acute infarcts are also present within the left insula and left basal ganglia. Suspected small chronic cortical infarct within the right occipital lobe (series 8, image 10). No evidence of intracranial mass. No chronic intracranial blood products. No extra-axial fluid  collection. No midline shift. Vascular: Expected proximal arterial flow voids. SWI signal loss along the course of the M1 and M2 left MCA likely reflecting stent artifact. Skull and upper cervical spine: No focal marrow lesion. Sinuses/Orbits: Visualized orbits show no acute finding. Trace bilateral ethmoid and maxillary sinus mucosal thickening. MRA HEAD FINDINGS Anterior circulation: The intracranial internal carotid arteries are patent. Mild stent artifact from a stent within the M1/M2 left MCA. The M1 left middle cerebral artery is now patent. Atherosclerotic irregularity of the M2 and more distal left MCA vessels. Most notably, there are sites of apparent high-grade stenosis within an inferior division proximal left M2 vessel. No proximal left M2 branch occlusion is identified. The M1 right MCA is patent. Redemonstrated moderate stenosis within the proximal right M 1 segment. No right M2 proximal branch occlusion or high-grade proximal stenosis. The anterior cerebral arteries are patent. No intracranial aneurysm is identified. Posterior circulation: The intracranial vertebral arteries are patent. The basilar artery is patent. The posterior cerebral arteries are patent. Redemonstrated atherosclerotic irregularity of the bilateral posterior cerebral arteries, most notably as follows. Multifocal severe stenoses within the P2 right PCA. Sites of up to moderate/severe stenosis within the P2 left PCA. Posterior communicating arteries are hypoplastic or absent bilaterally. Anatomic variants: As described IMPRESSION: MRI brain: 1. Scattered acute infarcts within the left MCA and MCA/PCA watershed territories affecting the cortical/subcortical left frontal, parietal and occipital lobes. Multiple acute infarcts are also present within the left insula and basal ganglia. No significant mass effect. No hemorrhagic conversion. 2. Suspected small chronic cortical infarct within the right occipital lobe. 3. Mild generalized  parenchymal atrophy. MRA head: 1. Mild artifact arising from a stent within the M1/M2 left MCA. The M1 left MCA is now patent. Atherosclerotic irregularity of the M2 and more distal left MCA vessels. Most notably, there is apparent high-grade stenosis within an inferior division proximal left M2 vessel. 2. Redemonstrated moderate stenosis within the proximal M1 right MCA. 3. Redemonstrated sites of severe stenosis within the P2 right PCA. 4. Redemonstrated sites of up to moderate/severe stenosis within the P2 left PCA. Electronically Signed   By: Kellie Simmering DO   On: 12/17/2020 16:01   MR BRAIN WO CONTRAST  Result Date: 12/17/2020 CLINICAL DATA:  Stroke, follow-up. EXAM: MRI HEAD WITHOUT CONTRAST MRA HEAD WITHOUT CONTRAST TECHNIQUE: Multiplanar, multi-echo pulse sequences of the brain and surrounding structures were acquired without intravenous contrast. Angiographic images of the Circle of Willis were acquired using MRA technique without intravenous contrast. COMPARISON: No pertinent prior exam. COMPARISON:  Noncontrast head CT 12/16/2020. CT angiogram head/neck and CT perfusion 12/16/2020. FINDINGS: MRI HEAD FINDINGS Brain: Mild intermittent motion degradation. Mild cerebral and cerebellar atrophy. Scattered acute infarcts within the left MCA and MCA/PCA watershed territories affecting the cortical/subcortical left frontal, parietal and occipital lobes. Multiple acute infarcts are also present within the left insula and left basal ganglia. Suspected small chronic cortical infarct within the right occipital lobe (series 8, image 10). No evidence of intracranial mass. No chronic intracranial blood products. No extra-axial fluid collection. No midline shift. Vascular: Expected proximal arterial flow voids. SWI signal loss along the course of the M1 and M2 left MCA likely reflecting stent artifact. Skull and upper cervical spine: No focal marrow lesion. Sinuses/Orbits: Visualized orbits show no acute finding.  Trace bilateral ethmoid and maxillary sinus mucosal thickening. MRA HEAD FINDINGS Anterior circulation: The intracranial internal carotid arteries are patent. Mild stent artifact from a stent within the M1/M2 left MCA. The M1 left middle cerebral artery is now patent. Atherosclerotic irregularity of the M2 and more distal left MCA vessels. Most notably, there are sites of apparent high-grade stenosis within an inferior division proximal left M2 vessel. No proximal left M2 branch occlusion is identified. The M1 right MCA is patent. Redemonstrated moderate stenosis within the proximal right M 1 segment. No right M2 proximal branch occlusion or high-grade proximal stenosis. The anterior cerebral arteries are patent. No intracranial aneurysm is identified. Posterior circulation: The intracranial vertebral arteries are patent. The basilar artery is patent. The posterior cerebral arteries are patent. Redemonstrated atherosclerotic irregularity of the bilateral posterior cerebral arteries, most notably as follows. Multifocal severe stenoses within the P2 right PCA. Sites of up to moderate/severe stenosis within the P2 left PCA. Posterior communicating arteries are hypoplastic or absent bilaterally. Anatomic variants: As described IMPRESSION: MRI brain: 1. Scattered acute infarcts within the left MCA and MCA/PCA watershed territories affecting the cortical/subcortical left frontal, parietal and occipital lobes. Multiple acute infarcts are also present within the left insula and basal ganglia. No significant mass effect. No hemorrhagic conversion. 2. Suspected small chronic cortical infarct within the right occipital lobe. 3. Mild generalized parenchymal atrophy. MRA head: 1. Mild artifact arising from a stent within the M1/M2 left MCA. The M1 left MCA is now patent. Atherosclerotic irregularity of the M2 and more distal left MCA vessels. Most notably, there is apparent high-grade stenosis within an inferior division  proximal left M2 vessel. 2. Redemonstrated moderate stenosis within the proximal M1 right MCA. 3. Redemonstrated sites of severe stenosis within the P2 right PCA. 4. Redemonstrated sites of up to moderate/severe stenosis within the P2 left PCA. Electronically Signed   By: Kellie Simmering DO   On: 12/17/2020 16:01   CT CEREBRAL PERFUSION W CONTRAST  Result Date: 12/16/2020 CLINICAL DATA:  Right-sided deficits EXAM: CT ANGIOGRAPHY HEAD AND NECK CT PERFUSION BRAIN TECHNIQUE: Multidetector CT imaging of the head and neck was performed using the standard protocol during bolus administration of intravenous contrast. Multiplanar CT image reconstructions and MIPs were obtained to evaluate the vascular anatomy. Carotid stenosis measurements (when applicable) are obtained utilizing NASCET criteria, using the distal internal carotid diameter as the denominator. Multiphase CT imaging of the brain was performed following IV bolus contrast injection. Subsequent parametric perfusion maps were calculated using RAPID software. CONTRAST:  134mL OMNIPAQUE IOHEXOL 350 MG/ML SOLN COMPARISON:  None. FINDINGS: CTA NECK FINDINGS SKELETON: There is no bony spinal canal stenosis. No lytic or blastic lesion. OTHER NECK: Normal pharynx, larynx and major salivary glands. No  cervical lymphadenopathy. Unremarkable thyroid gland. UPPER CHEST: No pneumothorax or pleural effusion. No nodules or masses. AORTIC ARCH: There is no calcific atherosclerosis of the aortic arch. There is no aneurysm, dissection or hemodynamically significant stenosis of the visualized portion of the aorta. Conventional 3 vessel aortic branching pattern. The visualized proximal subclavian arteries are widely patent. RIGHT CAROTID SYSTEM: Normal without aneurysm, dissection or stenosis. LEFT CAROTID SYSTEM: Normal without aneurysm, dissection or stenosis. VERTEBRAL ARTERIES: Left dominant configuration. Both origins are clearly patent. There is no dissection, occlusion or  flow-limiting stenosis to the skull base (V1-V3 segments). CTA HEAD FINDINGS POSTERIOR CIRCULATION: --Vertebral arteries: Normal V4 segments. --Inferior cerebellar arteries: Normal. --Basilar artery: Normal. --Superior cerebellar arteries: Normal. --Posterior cerebral arteries (PCA): Multifocal moderate-to-severe stenosis of the right P2 segment. Normal left. ANTERIOR CIRCULATION: --Intracranial internal carotid arteries: Normal. --Anterior cerebral arteries (ACA): Normal. Both A1 segments are present. Patent anterior communicating artery (a-comm). --Middle cerebral arteries (MCA): Complete occlusion of the left MCA at the proximal M1 segment with limited collateralization. Right MCA is normal. VENOUS SINUSES: As permitted by contrast timing, patent. ANATOMIC VARIANTS: None Review of the MIP images confirms the above findings. CT Brain Perfusion Findings: ASPECTS: 9 CBF (<30%) Volume: 94mL Perfusion (Tmax>6.0s) volume: 168mL Mismatch Volume: 173mL, likely slightly overestimated due to small area of completed infarction seen on the noncontrast head CT. Infarction Location:Left MCA territory IMPRESSION: 1. 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 2. Large area of ischemic penumbra in the left MCA territory. 3. 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 Critical Value/emergent results were called by telephone at the time of interpretation on 12/16/2020 at 9:02 pm to provider Christ Kick , who verbally acknowledged these results. Aortic Atherosclerosis (ICD10-I70.0). Electronically Signed   By: Ulyses Jarred M.D.   On: 12/16/2020 21:08   DG CHEST PORT 1 VIEW  Result Date: 12/17/2020 CLINICAL DATA:  Intubation. EXAM: PORTABLE CHEST 1 VIEW COMPARISON:  12/12/2020. FINDINGS: Endotracheal tube tip noted 4 cm above the carina. NG tube tip below left hemidiaphragm. Prior CABG. Heart size  normal. Low lung volumes. Mild left base atelectasis/infiltrate cannot be excluded. Mild elevation left hemidiaphragm. No pleural effusion or pneumothorax. Prior cervical spine fusion. IMPRESSION: 1.  Endotracheal tube and NG tube in good anatomic position. 2.  Prior CABG.  Heart size normal. 3. Mild left base atelectasis/infiltrate cannot be excluded. Mild elevation left hemidiaphragm. Electronically Signed   By: Marcello Moores  Register   On: 12/17/2020 05:18   DG Chest Port 1 View  Result Date: 12/12/2020 CLINICAL DATA:  Patient complaining of headache and overall not feeling well since yesterday. States that he has not taken BP meds in one day. Reports generalized pain and believes he may have been running fever at home. EXAM: PORTABLE CHEST - 1 VIEW COMPARISON:  09/14/2020 FINDINGS: Lungs are clear.  Previous CABG. Heart size and mediastinal contours are within normal limits. Aortic Atherosclerosis (ICD10-170.0). No effusion.  No pneumothorax. Visualized bones unremarkable. IMPRESSION: No acute cardiopulmonary disease post CABG. Electronically Signed   By: Lucrezia Europe M.D.   On: 12/12/2020 11:48   EEG adult  Result Date: 12/17/2020 Lora Havens, MD     12/17/2020  1:00 PM Patient Name: JERYN CERNEY MRN: [id number] Epilepsy Attending: Lora Havens Referring Physician/Provider: Dr. Rosalin Taylor Date: 12/17/2020 Duration: 23.59 mins Patient history: 55 year old male presented to ED with witnessed seizure activity and subsequent right hemiplegia with  aphasia and left gaze deviation.  EEG to evaluate for seizures. Level of alertness: Awake AEDs during EEG study: Lamotrigine, Depakote Technical aspects: This EEG study was done with scalp electrodes positioned according to the 10-20 International system of electrode placement. Electrical activity was acquired at a sampling rate of 500Hz  and reviewed with a high frequency filter of 70Hz  and a low frequency filter of 1Hz . EEG data were recorded continuously and  digitally stored. Description: The posterior dominant rhythm consists of 8 Hz activity of moderate voltage (25-35 uV) seen predominantly in posterior head regions, symmetric and reactive to eye opening and eye closing. EEG showed intermittent 3 to 6 Hz theta-delta slowing in left hemisphere.  Photic driving was not seen during photic stimulation. Hyperventilation was not performed.   ABNORMALITY - Intermittent slow, left hemisphere IMPRESSION: This study is suggestive of cortical dysfunction arising from left hemisphere, nonspecific etiology. No seizures or epileptiform discharges were seen throughout the recording. Lora Havens   ECHOCARDIOGRAM COMPLETE  Result Date: 12/17/2020    ECHOCARDIOGRAM REPORT   Patient Name:   VANNY GOETTE Date of Exam: 12/17/2020 Medical Rec #:  [id number]         Height:       67.0 in Accession #:    [id number]        Weight:       157.0 lb Date of Birth:  Feb 05, 1966          BSA:          1.825 m Patient Age:    26 years          BP:           96/70 mmHg Patient Gender: M                 HR:           60 bpm. Exam Location:  Inpatient Procedure: 2D Echo, Cardiac Doppler and Color Doppler Indications:    CVA  History:        Patient has prior history of Echocardiogram examinations, most                 recent 03/04/2019. Previous Myocardial Infarction and CAD, Prior                 CABG, Signs/Symptoms:Shortness of Breath; Risk Factors:Diabetes.                 03/28/2016 CABG                 03/07/2016 cath.  Sonographer:    Luisa Hart RDCS Referring Phys: [id number] Lorenza Chick  Sonographer Comments: Echo performed with patient supine and on artificial respirator. IMPRESSIONS  1. Left ventricular ejection fraction, by estimation, is 65 to 70%. The left ventricle has normal function. The left ventricle has no regional wall motion abnormalities. There is moderate left ventricular hypertrophy. Left ventricular diastolic parameters are consistent with Grade I diastolic  dysfunction (impaired relaxation).  2. Right ventricular systolic function is normal. The right ventricular size is normal. There is normal pulmonary artery systolic pressure. The estimated right ventricular systolic pressure is [id number] mmHg.  3. The mitral valve is normal in structure. Trivial mitral valve regurgitation. No evidence of mitral stenosis.  4. The aortic valve is normal in structure. Aortic valve regurgitation is not visualized. No aortic stenosis is present.  5. Aortic dilatation noted. There is borderline dilatation of the ascending aorta, measuring 38 mm.  6. The  inferior vena cava is normal in size with greater than 50% respiratory variability, suggesting right atrial pressure of 3 mmHg. Conclusion(s)/Recommendation(s): No intracardiac source of embolism detected on this transthoracic study. A transesophageal echocardiogram is recommended to exclude cardiac source of embolism if clinically indicated. FINDINGS  Left Ventricle: Left ventricular ejection fraction, by estimation, is 65 to 70%. The left ventricle has normal function. The left ventricle has no regional wall motion abnormalities. The left ventricular internal cavity size was normal in size. There is  moderate left ventricular hypertrophy. Left ventricular diastolic parameters are consistent with Grade I diastolic dysfunction (impaired relaxation). Right Ventricle: The right ventricular size is normal. No increase in right ventricular wall thickness. Right ventricular systolic function is normal. There is normal pulmonary artery systolic pressure. The tricuspid regurgitant velocity is 2.14 m/s, and  with an assumed right atrial pressure of 3 mmHg, the estimated right ventricular systolic pressure is [id number] mmHg. Left Atrium: Left atrial size was normal in size. Right Atrium: Right atrial size was normal in size. Pericardium: There is no evidence of pericardial effusion. Mitral Valve: The mitral valve is normal in structure. Trivial mitral valve  regurgitation. No evidence of mitral valve stenosis. MV peak gradient, 8.0 mmHg. The mean mitral valve gradient is 3.0 mmHg. Tricuspid Valve: The tricuspid valve is normal in structure. Tricuspid valve regurgitation is mild . No evidence of tricuspid stenosis. Aortic Valve: The aortic valve is normal in structure. Aortic valve regurgitation is not visualized. No aortic stenosis is present. Aortic valve mean gradient measures 3.0 mmHg. Aortic valve peak gradient measures 6.2 mmHg. Aortic valve area, by VTI measures 3.55 cm. Pulmonic Valve: The pulmonic valve was thickened with good excursion. Pulmonic valve regurgitation is mild. No evidence of pulmonic stenosis. Aorta: Aortic dilatation noted. There is borderline dilatation of the ascending aorta, measuring 38 mm. Venous: The inferior vena cava is normal in size with greater than 50% respiratory variability, suggesting right atrial pressure of 3 mmHg. IAS/Shunts: There is redundancy of the interatrial septum. No atrial level shunt detected by color flow Doppler.  LEFT VENTRICLE PLAX 2D LVIDd:         3.50 cm  Diastology LVIDs:         1.70 cm  LV e' medial:    7.40 cm/s LV PW:         1.50 cm  LV E/e' medial:  11.9 LV IVS:        1.50 cm  LV e' lateral:   9.36 cm/s LVOT diam:     2.20 cm  LV E/e' lateral: 9.4 LV SV:         95 LV SV Index:   52 LVOT Area:     3.80 cm  RIGHT VENTRICLE RV Basal diam:  4.30 cm RV Mid diam:    2.60 cm LEFT ATRIUM             Index       RIGHT ATRIUM           Index LA diam:        3.20 cm 1.75 cm/m  RA Area:     11.60 cm LA Vol (A2C):   24.9 ml 13.65 ml/m RA Volume:   26.10 ml  14.30 ml/m LA Vol (A4C):   33.7 ml 18.47 ml/m LA Biplane Vol: 30.6 ml 16.77 ml/m  AORTIC VALVE                   PULMONIC VALVE AV Area (Vmax):  3.25 cm    PV Vmax:       0.76 m/s AV Area (Vmean):   3.28 cm    PV Vmean:      50.700 cm/s AV Area (VTI):     3.55 cm    PV VTI:        0.159 m AV Vmax:           125.00 cm/s PV Peak grad:  2.3 mmHg AV  Vmean:          83.600 cm/s PV Mean grad:  1.0 mmHg AV VTI:            0.269 m AV Peak Grad:      6.2 mmHg AV Mean Grad:      3.0 mmHg LVOT Vmax:         107.00 cm/s LVOT Vmean:        72.200 cm/s LVOT VTI:          0.251 m LVOT/AV VTI ratio: 0.93  AORTA Ao Root diam: 3.70 cm Ao Asc diam:  3.80 cm MITRAL VALVE                TRICUSPID VALVE MV Area (PHT): 2.76 cm     TR Peak grad:   18.3 mmHg MV Area VTI:   2.07 cm     TR Vmax:        214.00 cm/s MV Peak grad:  8.0 mmHg MV Mean grad:  3.0 mmHg     SHUNTS MV Vmax:       1.41 m/s     Systemic VTI:  0.25 m MV Vmean:      80.8 cm/s    Systemic Diam: 2.20 cm MV Decel Time: 275 msec MV E velocity: 88.10 cm/s MV A velocity: 109.00 cm/s MV E/A ratio:  0.81 Cherlynn Kaiser MD Electronically signed by Cherlynn Kaiser MD Signature Date/Time: 12/17/2020/1:53:18 PM    Final    CT HEAD CODE STROKE WO CONTRAST  Result Date: 12/16/2020 CLINICAL DATA:  Code stroke.  Right-sided deficits EXAM: CT HEAD WITHOUT CONTRAST TECHNIQUE: Contiguous axial images were obtained from the base of the skull through the vertex without intravenous contrast. COMPARISON:  12/12/2020 FINDINGS: Brain: There is no mass, hemorrhage or extra-axial collection. The size and configuration of the ventricles and extra-axial CSF spaces are normal. Area of hypoattenuation in the inferior left frontal lobe, likely early subacute infarct. Small vessel infarcts in the left corona radiata are unchanged. Vascular: No abnormal hyperdensity of the major intracranial arteries or dural venous sinuses. No intracranial atherosclerosis. Skull: The visualized skull base, calvarium and extracranial soft tissues are normal. Sinuses/Orbits: No fluid levels or advanced mucosal thickening of the visualized paranasal sinuses. No mastoid or middle ear effusion. The orbits are normal. ASPECTS [facility] Stroke Program Early CT Score) - Ganglionic level infarction (caudate, lentiform nuclei, internal capsule, insula, M1-M3 cortex):  6 - Supraganglionic infarction (M4-M6 cortex): 3 Total score (0-10 with 10 being normal): 9 IMPRESSION: 1. No acute intracranial hemorrhage. 2. Suspected early subacute infarct of the inferior left frontal lobe. 3. ASPECTS is 9. These results were called by telephone at the time of interpretation on 12/16/2020 at 8:25 pm to provider [facility] , who verbally acknowledged these results. Electronically Signed   By: Ulyses Jarred M.D.   On: 12/16/2020 20:26   CT ANGIO HEAD CODE STROKE  Result Date: 12/16/2020 CLINICAL DATA:  Right-sided deficits EXAM: CT ANGIOGRAPHY HEAD AND NECK CT PERFUSION BRAIN TECHNIQUE: Multidetector CT imaging  of the head and neck was performed using the standard protocol during bolus administration of intravenous contrast. Multiplanar CT image reconstructions and MIPs were obtained to evaluate the vascular anatomy. Carotid stenosis measurements (when applicable) are obtained utilizing NASCET criteria, using the distal internal carotid diameter as the denominator. Multiphase CT imaging of the brain was performed following IV bolus contrast injection. Subsequent parametric perfusion maps were calculated using RAPID software. CONTRAST:  123mL OMNIPAQUE IOHEXOL 350 MG/ML SOLN COMPARISON:  None. FINDINGS: CTA NECK FINDINGS SKELETON: There is no bony spinal canal stenosis. No lytic or blastic lesion. OTHER NECK: Normal pharynx, larynx and major salivary glands. No cervical lymphadenopathy. Unremarkable thyroid gland. UPPER CHEST: No pneumothorax or pleural effusion. No nodules or masses. AORTIC ARCH: There is no calcific atherosclerosis of the aortic arch. There is no aneurysm, dissection or hemodynamically significant stenosis of the visualized portion of the aorta. Conventional 3 vessel aortic branching pattern. The visualized proximal subclavian arteries are widely patent. RIGHT CAROTID SYSTEM: Normal without aneurysm, dissection or stenosis. LEFT CAROTID SYSTEM: Normal without aneurysm,  dissection or stenosis. VERTEBRAL ARTERIES: Left dominant configuration. Both origins are clearly patent. There is no dissection, occlusion or flow-limiting stenosis to the skull base (V1-V3 segments). CTA HEAD FINDINGS POSTERIOR CIRCULATION: --Vertebral arteries: Normal V4 segments. --Inferior cerebellar arteries: Normal. --Basilar artery: Normal. --Superior cerebellar arteries: Normal. --Posterior cerebral arteries (PCA): Multifocal moderate-to-severe stenosis of the right P2 segment. Normal left. ANTERIOR CIRCULATION: --Intracranial internal carotid arteries: Normal. --Anterior cerebral arteries (ACA): Normal. Both A1 segments are present. Patent anterior communicating artery (a-comm). --Middle cerebral arteries (MCA): Complete occlusion of the left MCA at the proximal M1 segment with limited collateralization. Right MCA is normal. VENOUS SINUSES: As permitted by contrast timing, patent. ANATOMIC VARIANTS: None Review of the MIP images confirms the above findings. CT Brain Perfusion Findings: ASPECTS: 9 CBF (<30%) Volume: 77mL Perfusion (Tmax>6.0s) volume: 169mL Mismatch Volume: 195mL, likely slightly overestimated due to small area of completed infarction seen on the noncontrast head CT. Infarction Location:Left MCA territory IMPRESSION: 1. 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 2. Large area of ischemic penumbra in the left MCA territory. 3. 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 Critical Value/emergent results were called by telephone at the time of interpretation on 12/16/2020 at 9:02 pm to provider Christ Kick , who verbally acknowledged these results. Aortic Atherosclerosis (ICD10-I70.0). Electronically Signed   By: Ulyses Jarred M.D.   On: 12/16/2020 21:08   CT ANGIO NECK CODE STROKE  Result Date: 12/16/2020 CLINICAL DATA:  Right-sided deficits EXAM: CT ANGIOGRAPHY  HEAD AND NECK CT PERFUSION BRAIN TECHNIQUE: Multidetector CT imaging of the head and neck was performed using the standard protocol during bolus administration of intravenous contrast. Multiplanar CT image reconstructions and MIPs were obtained to evaluate the vascular anatomy. Carotid stenosis measurements (when applicable) are obtained utilizing NASCET criteria, using the distal internal carotid diameter as the denominator. Multiphase CT imaging of the brain was performed following IV bolus contrast injection. Subsequent parametric perfusion maps were calculated using RAPID software. CONTRAST:  155mL OMNIPAQUE IOHEXOL 350 MG/ML SOLN COMPARISON:  None. FINDINGS: CTA NECK FINDINGS SKELETON: There is no bony spinal canal stenosis. No lytic or blastic lesion. OTHER NECK: Normal pharynx, larynx and major salivary glands. No cervical lymphadenopathy. Unremarkable thyroid gland. UPPER CHEST: No pneumothorax or pleural effusion. No nodules or masses. AORTIC ARCH: There is no calcific atherosclerosis of the aortic arch. There is  no aneurysm, dissection or hemodynamically significant stenosis of the visualized portion of the aorta. Conventional 3 vessel aortic branching pattern. The visualized proximal subclavian arteries are widely patent. RIGHT CAROTID SYSTEM: Normal without aneurysm, dissection or stenosis. LEFT CAROTID SYSTEM: Normal without aneurysm, dissection or stenosis. VERTEBRAL ARTERIES: Left dominant configuration. Both origins are clearly patent. There is no dissection, occlusion or flow-limiting stenosis to the skull base (V1-V3 segments). CTA HEAD FINDINGS POSTERIOR CIRCULATION: --Vertebral arteries: Normal V4 segments. --Inferior cerebellar arteries: Normal. --Basilar artery: Normal. --Superior cerebellar arteries: Normal. --Posterior cerebral arteries (PCA): Multifocal moderate-to-severe stenosis of the right P2 segment. Normal left. ANTERIOR CIRCULATION: --Intracranial internal carotid arteries: Normal.  --Anterior cerebral arteries (ACA): Normal. Both A1 segments are present. Patent anterior communicating artery (a-comm). --Middle cerebral arteries (MCA): Complete occlusion of the left MCA at the proximal M1 segment with limited collateralization. Right MCA is normal. VENOUS SINUSES: As permitted by contrast timing, patent. ANATOMIC VARIANTS: None Review of the MIP images confirms the above findings. CT Brain Perfusion Findings: ASPECTS: 9 CBF (<30%) Volume: 25mL Perfusion (Tmax>6.0s) volume: 145mL Mismatch Volume: 177mL, likely slightly overestimated due to small area of completed infarction seen on the noncontrast head CT. Infarction Location:Left MCA territory IMPRESSION: 1. 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 2. Large area of ischemic penumbra in the left MCA territory. 3. 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 Critical Value/emergent results were called by telephone at the time of interpretation on 12/16/2020 at 9:02 pm to provider Christ Kick , who verbally acknowledged these results. Aortic Atherosclerosis (ICD10-I70.0). Electronically Signed   By: Ulyses Jarred M.D.   On: 12/16/2020 21:08    PHYSICAL EXAM  Temp:  [97.5 F (36.4 C)-98.3 F (36.8 C)] 97.8 F (36.6 C) (05/20 0800) Pulse Rate:  [48-70] 51 (05/20 0900) Resp:  [8-20] 14 (05/20 0900) BP: (108-166)/(54-92) 158/88 (05/20 0900) SpO2:  [96 %-100 %] 99 % (05/20 0900) Arterial Line BP: (120-149)/(65-76) 137/70 (05/19 1235) Weight:  [61.3 kg] 61.3 kg (05/19 1630)  General - Well nourished, well developed, not in acute distress, feeding himself breakfast really well.  Ophthalmologic - fundi not visualized due to noncooperation.  Cardiovascular - Regular rhythm and bradycardia.  Neuro - awake, alert, eyes open, orientated to people, age and self, but not to time. No aphasia except intermittent  paraphasic errors with mild perseveration, mild dysarthria, following all simple commands. Able to repeat simple sentences but complex sentences, able to name 3/3. No gaze palsy, tracking bilaterally, visual field full, PERRL. No facial droop. Tongue midline. LUE no drift, RUE pronator drift, but finger grip 4/5 bilaterally. BLE 3/5 proximal, distally left 5/5 and right 4/5. Sensation symmetrical bilaterally, b/l FTN intact grossly, gait not tested.   ASSESSMENT/PLAN Melvin Taylor is a 55 y.o. male with history of seizure on Depakote and Lamictal, CAD status post CABG, diabetes, hypertension, hyperlipidemia, OSA, substance abuse admitted for seizure, right-sided weakness and slurred speech. No tPA given due to outside window.    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   CT repeat left MCA infarct with trace SAH  MRI scattered left MCA infarcts. small chronic cortical infarct within the right occipital lobe.  MRA left M1 patent, high-grade  stenosis inferior left M2, moderate stenosis right M1, severe stenosis, bilateral P2.  2D Echo  EF 65-70%  LDL 116  HgbA1c 9.7  Lovenox for VTE prophylaxis  clopidogrel 75 mg daily prior to admission, now on Brilinta (ticagrelor) 90 mg bid post stent, patient has allergy to aspirin.  Patient counseled to be compliant with his antithrombotic medications  Ongoing aggressive stroke risk factor management  Therapy recommendations: Pending, pt lives alone  Disposition: Pending  Seizure  History of seizure on Depakote and Lamictal  Recent breakthrough seizure  On Depakote 1500 twice daily PTA -> 1000 bid  lamictal 50 twice daily   EEG no seizure  Depakote level 39->150, hold off morning dose and decrease depakote to 1000mg  bid  depakote  level daily  Diabetes Hypoglycemia 5/20  HgbA1c 9.7 goal < 7.0  Uncontrolled  CBG monitoring  Overnight glucose 56->124->112  SSI  DM coordinator consulted  DM education and close PCP follow up  Hypertension . Stable . BP goal 120-140 within 24h of IR  Long term BP goal 130-150 given intracranial stenosis  Hyperlipidemia  Home meds: Lipitor 40  LDL 116, goal < 70  Now on Lipitor 80  Continue statin at discharge  Tobacco abuse  Current smoker  Smoking cessation counseling provided  Pt is willing to quit  Urine retention  in and out x 2  may needs foley  Bladder scan  put on flomax  Other Stroke Risk Factors  UDS positive THC, cessation education provided  ETOH use  Hx stroke/TIA - old infarct on MRI  Coronary artery disease status post CABG  Obstructive sleep apnea, on CPAP at home  Other Active Problems  AKI on CKD IIa, Cre 1.33->2.15->2.20->1.75->1.74, continue IV fluid  Hospital day # 2  This patient is critically ill due to left MCA stroke, thrombectomy, history of seizure, AKI, hypoglycemia, seizure and at significant risk of neurological worsening, death form recurrent stroke, hemorrhagic conversion, status epilepticus, hypoglycemic, renal failure. This patient's care requires constant monitoring of vital signs, hemodynamics, respiratory and cardiac monitoring, review of multiple databases, neurological assessment, discussion with family, other specialists and medical decision making of high complexity. I spent 35 minutes of neurocritical care time in the care of this patient. I had long discussion with Sister Melvin Haw over the phone, updated pt current condition, treatment plan and potential prognosis, and answered all the questions.  She expressed understanding and appreciation.  I also discussed with Dr. Tacy Learn CCM, and CCM NP Jerene Pitch.  Rosalin Hawking, MD PhD Stroke Neurology 12/18/2020 9:54 AM    To contact Stroke Continuity provider,  please refer to [email or web address] After hours, contact General Neurology

## 2020-12-18 NOTE — Consult Note (Incomplete)
Physical Medicine and Rehabilitation Consult Reason for Consult: Right side weakness and aphasia Referring Physician: Dr.Xu   HPI: Melvin Taylor is a 55 y.o. right-handed male with history of hypertension, hyperlipidemia, CAD with myocardial infarction maintained on Plavix, epilepsy/seizures, schizophrenia, diabetes mellitus, tobacco as well as polysubstance abuse question medical compliance.  Per chart review patient lives alone.  1 level apartment.  Has a PCA 4 hours a day 6 days a week.  Independent with assistive device.  Presented 12/16/2020 with right side weakness and speech difficulties.  CT/MRI showed scattered acute infarcts within the left MCA and MCA/PCA watershed territories affecting the cortical/subcortical left frontal parietal and occipital lobes.  Multiple acute infarcts also present within the left insula and basal ganglia.  No significant mass-effect.  No hemorrhagic conversion.  MRA demonstrated moderate stenosis within the proximal M1 right MCA.  Severe stenosis within the P2 and right PCA.  Patient underwent mechanical thrombectomy per interventional radiology.  Echocardiogram with ejection fraction of 65 to 70% grade 1 diastolic dysfunction.  EEG negative for seizure.  Urine drug screen positive marijuana.  Neurology follow-up work-up presently ongoing.  Maintained on Lovenox for DVT prophylaxis.  Therapy evaluations completed with recommendations of physical medicine rehab consult due to right side weakness and aphasia   Review of Systems  Constitutional: Negative for chills and fever.  HENT: Negative for hearing loss.   Eyes: Negative for blurred vision and double vision.  Respiratory: Negative for shortness of breath.   Cardiovascular: Negative for chest pain and leg swelling.  Gastrointestinal: Positive for constipation. Negative for heartburn, nausea and vomiting.  Genitourinary: Negative for dysuria, flank pain and hematuria.  Musculoskeletal: Positive for  back pain, joint pain and myalgias.  Skin: Negative for rash.  Neurological: Positive for speech change and weakness.  Psychiatric/Behavioral: Positive for depression. The patient has insomnia.        Schizophrenia/bipolar disorder  All other systems reviewed and are negative.  Past Medical History:  Diagnosis Date  . Anxiety   . Bipolar 1 disorder (HCC)   . Bone spur    left heel  . Cervical radiculopathy   . Chest pain   . Chronic back pain   . Chronic chest wall pain   . Chronic neck pain   . Coronary artery disease    a. s/p CABG in 03/2016 with LIMA-LAD, SVG-D1, SVG-RCA, and Seq SVG-mid and distal OM  . Depression   . Diabetes mellitus    Type II  . Hallucinations    "long history of them"  . Headache(784.0)   . History of gout   . HTN (hypertension)   . Insomnia   . Myocardial infarction (HCC) 2017  . Neuropathy   . Pain management   . Polysubstance abuse (HCC)   . Right leg pain    chronic  . Schizophrenia (HCC)   . Seizures (HCC)    last sz between July 5-9th, 2016; epilepsy  . Shortness of breath dyspnea    with exertion  . Sleep apnea   . Transfusion of blood product refused for religious reason    Past Surgical History:  Procedure Laterality Date  . ANTERIOR CERVICAL DECOMP/DISCECTOMY FUSION N/A 12/14/2015   Procedure: ANTERIOR CERVICAL DECOMPRESSION/DISCECTOMY FUSION CERVICAL FIVE -SIX;  Surgeon: Julio Sicks, MD;  Location: [facility];  Service: Neurosurgery;  Laterality: N/A;  . BIOPSY  05/07/2015   Procedure: BIOPSY (Gastric);  Surgeon: Corbin Ade, MD;  Location: [facility];  Service: Endoscopy;;  .  CARDIAC CATHETERIZATION N/A 03/07/2016   Procedure: Left Heart Cath and Coronary Angiography;  Surgeon: Belva Crome, MD;  Location: [facility];  Service: Cardiovascular;  Laterality: N/A;  . COLONOSCOPY WITH PROPOFOL N/A 05/07/2015   [id number] diverticulosis, multiple colon polyps removed, tubular adenoma, serrated colon polyp. Next colonoscopy  October 2019  . COLONOSCOPY WITH PROPOFOL N/A 08/02/2018   Procedure: COLONOSCOPY WITH PROPOFOL;  Surgeon: [name], MD;  Location: [facility];  Service: Endoscopy;  Laterality: N/A;  12:00pm  . CORONARY ARTERY BYPASS GRAFT N/A 03/28/2016   Procedure: CORONARY ARTERY BYPASS GRAFTING (CABG) x 5 USING GREATER SAPHENOUS VEIN;  Surgeon: [name], MD;  Location: [facility];  Service: Open Heart Surgery;  Laterality: N/A;  . ENDOVEIN HARVEST OF GREATER SAPHENOUS VEIN Right 03/28/2016   Procedure: ENDOVEIN HARVEST OF GREATER SAPHENOUS VEIN;  Surgeon: [name], MD;  Location: [facility];  Service: Open Heart Surgery;  Laterality: Right;  . ESOPHAGEAL DILATION N/A 05/07/2015   Procedure: ESOPHAGEAL DILATION WITH 56FR MALONEY DILATOR;  Surgeon: [name], MD;  Location: [facility];  Service: Endoscopy;  Laterality: N/A;  . ESOPHAGOGASTRODUODENOSCOPY (EGD) WITH PROPOFOL N/A 05/07/2015   RMR: Status post dilation of normal esophagus. Gastritis.  Marland Kitchen KNEE SURGERY Left    arthroscopy  . MANDIBLE FRACTURE SURGERY    . POLYPECTOMY  05/07/2015   Procedure: POLYPECTOMY (Hepatic Flexure, Distal Transverse Colon, Rectal);  Surgeon: [name], MD;  Location: [facility];  Service: Endoscopy;;  . RADIOLOGY WITH ANESTHESIA N/A 12/16/2020   Procedure: IR WITH ANESTHESIA;  Surgeon: Luanne Bras, MD;  Location: [facility];  Service: Radiology;  Laterality: N/A;  . TEE WITHOUT CARDIOVERSION N/A 03/28/2016   Procedure: TRANSESOPHAGEAL ECHOCARDIOGRAM (TEE);  Surgeon: [name], MD;  Location: [facility];  Service: Open Heart Surgery;  Laterality: N/A;  . TONSILLECTOMY     Family History  Problem Relation Age of Onset  . Diabetes Father   . Hypertension Father   . Arthritis Other   . Diabetes Other   . Asthma Other   . Stroke Sister    Social History:  reports that he has been smoking cigarettes. He has a 15.00 pack-year smoking history. He has never used smokeless tobacco. He reports current drug use. Drug:  Marijuana. He reports that he does not drink alcohol. Allergies:  Allergies  Allergen Reactions  . Aspirin Swelling and Other (See Comments)    Facial swelling    Medications Prior to Admission  Medication Sig Dispense Refill  . albuterol (PROVENTIL HFA;VENTOLIN HFA) 108 (90 Base) MCG/ACT inhaler Inhale 2 puffs into the lungs every 4 (four) hours as needed for wheezing or shortness of breath.     Marland Kitchen amLODipine (NORVASC) 10 MG tablet Take 1 tablet (10 mg total) by mouth daily. 30 tablet 3  . atorvastatin (LIPITOR) 40 MG tablet Take 40 mg by mouth daily.    . baclofen (LIORESAL) 10 MG tablet Take 10 mg by mouth 2 (two) times daily.    . clopidogrel (PLAVIX) 75 MG tablet Take 75 mg by mouth daily.    . cycloSPORINE (RESTASIS) 0.05 % ophthalmic emulsion Place 1 drop into both eyes 2 (two) times daily.    . diazepam (VALIUM) 5 MG tablet Take 5 mg by mouth 2 (two) times daily as needed for anxiety.     . divalproex (DEPAKOTE) 500 MG DR tablet Take 3 tablets (1,500 mg total) by mouth 2 (two) times daily. 540 tablet 4  .  escitalopram (LEXAPRO) 20 MG tablet Take 20 mg by mouth daily.     Marland Kitchen ibuprofen (ADVIL) 800 MG tablet Take 800 mg by mouth every 6 (six) hours as needed for moderate pain.    Marland Kitchen insulin aspart (NOVOLOG) 100 UNIT/ML FlexPen Inject 4-10 Units into the skin 3 (three) times daily with meals. (Patient taking differently: Inject 4 Units into the skin in the morning, at noon, in the evening, and at bedtime.) 15 mL 1  . insulin glargine (LANTUS SOLOSTAR) 100 UNIT/ML Solostar Pen Inject 40 Units into the skin at bedtime. 30 mL 0  . lamoTRIgine (LAMICTAL) 25 MG tablet Take 50 mg by mouth 2 (two) times daily.    Marland Kitchen lisinopril (ZESTRIL) 20 MG tablet TAKE ONE TABLET BY MOUTH ONCE DAILY. (Patient taking differently: Take 20 mg by mouth daily.) 90 tablet 3  . Melatonin 10 MG CAPS Take 10 capsules by mouth at bedtime as needed (for sleep).    . nitroGLYCERIN (NITROSTAT) 0.4 MG SL tablet Place 1 tablet  (0.4 mg total) under the tongue every 5 (five) minutes as needed for chest pain. 25 tablet 3  . OLANZapine (ZYPREXA) 5 MG tablet Take 5 mg by mouth in the morning and at bedtime.    Marland Kitchen oxyCODONE-acetaminophen (PERCOCET) 5-325 MG tablet Take 1-2 tablets by mouth every 4 (four) hours as needed for severe pain. 30 tablet 0  . ranolazine (RANEXA) 500 MG 12 hr tablet Take 1 tablet (500 mg total) by mouth 2 (two) times daily. 180 tablet 3  . risperiDONE (RISPERDAL) 3 MG tablet Take 3 mg by mouth at bedtime.    . isosorbide mononitrate (IMDUR) 30 MG 24 hr tablet Take 1 tablet (30 mg total) by mouth daily. (Patient not taking: Reported on 12/17/2020) 30 tablet 1  . paliperidone (INVEGA) 6 MG 24 hr tablet Take 12 mg by mouth daily. (Patient not taking: Reported on 12/17/2020)      Home: Home Living Family/patient expects to be discharged to:: Private residence Living Arrangements: Alone Available Help at Discharge:  (per OT pt has personal care attendant 4 hrs/day 6 days/wk) Type of Home: Apartment Home Access: Level entry Home Layout: One level Bathroom Shower/Tub: Chiropodist: Standard Bathroom Accessibility: Yes Home Equipment: Environmental consultant - 2 wheels  Lives With: Alone  Functional History: Prior Function Level of Independence: Needs assistance,Independent,Independent with assistive device(s) Gait / Transfers Assistance Needed: ambulated independently ADL's / Homemaking Assistance Needed: I with ADL, assist with IADL tasks form PCA for medicaiton managment, cooking, some shopping; family also assisted. Pt does some cooking himself and occasionally does his own laundry. L:ikes to walk Communication / Swallowing Assistance Needed: [facility] Functional Status:  Mobility: Bed Mobility Overal bed mobility: Needs Assistance Bed Mobility: Supine to Sit Supine to sit: Min guard Transfers Overall transfer level: Needs assistance Equipment used: Rolling walker (2 wheeled) Transfers: Sit  to/from Stand Sit to Stand: Min assist,+2 safety/equipment      ADL: ADL Overall ADL's : Needs assistance/impaired Eating/Feeding: Set up,Sitting Grooming: Set up,Sitting Upper Body Bathing: Set up,Sitting Lower Body Bathing: Minimal assistance,Sit to/from stand Upper Body Dressing : Minimal assistance,Sitting Lower Body Dressing: Minimal assistance,Sit to/from stand Toilet Transfer: Minimal assistance,+2 for safety/equipment,RW Toileting- Clothing Manipulation and Hygiene: Total assistance Toileting - Clothing Manipulation Details (indicate cue type and reason): condom cath Functional mobility during ADLs: Minimal assistance,+2 for safety/equipment,Rolling walker,Cueing for safety  Cognition: Cognition Overall Cognitive Status: Impaired/Different from baseline Arousal/Alertness: Awake/alert Orientation Level: Oriented to person Cognition Arousal/Alertness: Awake/alert Behavior  During Therapy: Flat affect,Impulsive Overall Cognitive Status: Impaired/Different from baseline Area of Impairment: Orientation,Attention,Memory,Following commands,Safety/judgement,Awareness,Problem solving Orientation Level: Disoriented to,Place,Time (aware he had a stroke) Current Attention Level: Selective Memory: Decreased recall of precautions,Decreased short-term memory Following Commands: Follows one step commands with increased time Safety/Judgement: Decreased awareness of safety,Decreased awareness of deficits Awareness: Intellectual Problem Solving: Slow processing,Difficulty sequencing,Requires verbal cues  Blood pressure (!) 162/77, pulse (!) 59, temperature 98.1 F (36.7 C), temperature source Axillary, resp. rate 19, height 5\' 7"  (1.702 m), weight 61.3 kg, SpO2 100 %. Physical Exam Neurological:     Comments: Patient is alert.  Mild dysarthria.  Makes eye contact with examiner.  Provides name and age but not time.  Follows simple commands.     Results for orders placed or performed  during the hospital encounter of 12/16/20 (from the past 24 hour(s))  Glucose, capillary     Status: Abnormal   Collection Time: 12/17/20  4:00 PM  Result Value Ref Range   Glucose-Capillary 119 (H) 70 - 99 mg/dL   Comment 1 Notify RN    Comment 2 Document in Chart   Glucose, capillary     Status: Abnormal   Collection Time: 12/17/20  7:51 PM  Result Value Ref Range   Glucose-Capillary 108 (H) 70 - 99 mg/dL  Glucose, capillary     Status: Abnormal   Collection Time: 12/17/20 11:31 PM  Result Value Ref Range   Glucose-Capillary 150 (H) 70 - 99 mg/dL  Glucose, capillary     Status: None   Collection Time: 12/18/20  3:30 AM  Result Value Ref Range   Glucose-Capillary 88 70 - 99 mg/dL  Basic metabolic panel     Status: Abnormal   Collection Time: 12/18/20  4:29 AM  Result Value Ref Range   Sodium 138 135 - 145 mmol/L   Potassium 3.7 3.5 - 5.1 mmol/L   Chloride 114 (H) 98 - 111 mmol/L   CO2 19 (L) 22 - 32 mmol/L   Glucose, Bld 86 70 - 99 mg/dL   BUN 19 6 - 20 mg/dL   Creatinine, Ser 1.74 (H) 0.61 - 1.24 mg/dL   Calcium 7.8 (L) 8.9 - 10.3 mg/dL   GFR, Estimated 45 (L) >60 mL/min   Anion gap 5 5 - 15  CBC with Differential/Platelet     Status: Abnormal   Collection Time: 12/18/20  4:29 AM  Result Value Ref Range   WBC 6.2 4.0 - 10.5 K/uL   RBC 3.51 (L) 4.22 - 5.81 MIL/uL   Hemoglobin 11.1 (L) 13.0 - 17.0 g/dL   HCT 31.5 (L) 39.0 - 52.0 %   MCV 89.7 80.0 - 100.0 fL   MCH 31.6 26.0 - 34.0 pg   MCHC 35.2 30.0 - 36.0 g/dL   RDW 12.2 11.5 - 15.5 %   Platelets 158 150 - 400 K/uL   nRBC 0.0 0.0 - 0.2 %   Neutrophils Relative % 72 %   Neutro Abs 4.4 1.7 - 7.7 K/uL   Lymphocytes Relative 21 %   Lymphs Abs 1.3 0.7 - 4.0 K/uL   Monocytes Relative 7 %   Monocytes Absolute 0.5 0.1 - 1.0 K/uL   Eosinophils Relative 0 %   Eosinophils Absolute 0.0 0.0 - 0.5 K/uL   Basophils Relative 0 %   Basophils Absolute 0.0 0.0 - 0.1 K/uL   Immature Granulocytes 0 %   Abs Immature Granulocytes  0.01 0.00 - 0.07 K/uL  Valproic acid level     Status:  Abnormal   Collection Time: 12/18/20  4:29 AM  Result Value Ref Range   Valproic Acid Lvl 150 (H) 50.0 - 100.0 ug/mL  Magnesium     Status: Abnormal   Collection Time: 12/18/20  4:29 AM  Result Value Ref Range   Magnesium 2.8 (H) 1.7 - 2.4 mg/dL  Phosphorus     Status: None   Collection Time: 12/18/20  4:29 AM  Result Value Ref Range   Phosphorus 4.3 2.5 - 4.6 mg/dL  Glucose, capillary     Status: Abnormal   Collection Time: 12/18/20  7:48 AM  Result Value Ref Range   Glucose-Capillary 55 (L) 70 - 99 mg/dL  Glucose, capillary     Status: Abnormal   Collection Time: 12/18/20  7:50 AM  Result Value Ref Range   Glucose-Capillary 56 (L) 70 - 99 mg/dL  Glucose, capillary     Status: Abnormal   Collection Time: 12/18/20  8:18 AM  Result Value Ref Range   Glucose-Capillary 124 (H) 70 - 99 mg/dL  Glucose, capillary     Status: Abnormal   Collection Time: 12/18/20 11:54 AM  Result Value Ref Range   Glucose-Capillary 112 (H) 70 - 99 mg/dL  Glucose, capillary     Status: Abnormal   Collection Time: 12/18/20  1:46 PM  Result Value Ref Range   Glucose-Capillary 158 (H) 70 - 99 mg/dL   CT HEAD WO CONTRAST  Result Date: 12/17/2020 CLINICAL DATA:  55 year old male code stroke presentation with left MCA M1 ELVO status post endovascular reperfusion. EXAM: CT HEAD WITHOUT CONTRAST TECHNIQUE: Contiguous axial images were obtained from the base of the skull through the vertex without intravenous contrast. COMPARISON:  CTA, CTP and CT head yesterday. FINDINGS: Brain: Pseudo normalization of the left inferior frontal gyrus which was hypodense at presentation yesterday (series 3, image 19). Trace subarachnoid hemorrhage in the left central sulcus on series 3, image 24. Trace contrast staining suspected in the left inferior basal ganglia series 3, image 15. And questionable additional mild gyral contrast staining in the anterior left MCA territory.  Scattered areas of subcortical and central white matter hypodensity in the left frontal lobe are otherwise stable to decreased in conspicuity (series 3, image 22). But there is no confluent cytotoxic edema evident, and no intracranial mass effect or midline shift. No ventriculomegaly. No other intracranial hemorrhage identified. Vascular: Left MCA M1 region vascular stent is new since presentation. Skull: No acute osseous abnormality identified. Sinuses/Orbits: Trace fluid and mucosal thickening in the paranasal sinuses now. Mastoids and tympanic cavities remain clear. Other: Intubated. Fluid in the nasopharynx. Negative orbit and scalp soft tissues. IMPRESSION: 1. Some pseudo-normalization of the left inferior frontal gyrus hypodensity since the presentation CT yesterday which might be related to contrast staining. But there is otherwise little to no cytotoxic edema evident in the Left MCA territory. 2. Trace subarachnoid hemorrhage in the left hemisphere. No malignant hemorrhagic transformation or intracranial mass effect. 3. New left MCA M1 stent. Electronically Signed   By: Genevie Ann M.D.   On: 12/17/2020 06:15   MR ANGIO HEAD WO CONTRAST  Result Date: 12/17/2020 CLINICAL DATA:  Stroke, follow-up. EXAM: MRI HEAD WITHOUT CONTRAST MRA HEAD WITHOUT CONTRAST TECHNIQUE: Multiplanar, multi-echo pulse sequences of the brain and surrounding structures were acquired without intravenous contrast. Angiographic images of the Circle of Willis were acquired using MRA technique without intravenous contrast. COMPARISON: No pertinent prior exam. COMPARISON:  Noncontrast head CT 12/16/2020. CT angiogram head/neck and CT perfusion 12/16/2020. FINDINGS:  MRI HEAD FINDINGS Brain: Mild intermittent motion degradation. Mild cerebral and cerebellar atrophy. Scattered acute infarcts within the left MCA and MCA/PCA watershed territories affecting the cortical/subcortical left frontal, parietal and occipital lobes. Multiple acute  infarcts are also present within the left insula and left basal ganglia. Suspected small chronic cortical infarct within the right occipital lobe (series 8, image 10). No evidence of intracranial mass. No chronic intracranial blood products. No extra-axial fluid collection. No midline shift. Vascular: Expected proximal arterial flow voids. SWI signal loss along the course of the M1 and M2 left MCA likely reflecting stent artifact. Skull and upper cervical spine: No focal marrow lesion. Sinuses/Orbits: Visualized orbits show no acute finding. Trace bilateral ethmoid and maxillary sinus mucosal thickening. MRA HEAD FINDINGS Anterior circulation: The intracranial internal carotid arteries are patent. Mild stent artifact from a stent within the M1/M2 left MCA. The M1 left middle cerebral artery is now patent. Atherosclerotic irregularity of the M2 and more distal left MCA vessels. Most notably, there are sites of apparent high-grade stenosis within an inferior division proximal left M2 vessel. No proximal left M2 branch occlusion is identified. The M1 right MCA is patent. Redemonstrated moderate stenosis within the proximal right M 1 segment. No right M2 proximal branch occlusion or high-grade proximal stenosis. The anterior cerebral arteries are patent. No intracranial aneurysm is identified. Posterior circulation: The intracranial vertebral arteries are patent. The basilar artery is patent. The posterior cerebral arteries are patent. Redemonstrated atherosclerotic irregularity of the bilateral posterior cerebral arteries, most notably as follows. Multifocal severe stenoses within the P2 right PCA. Sites of up to moderate/severe stenosis within the P2 left PCA. Posterior communicating arteries are hypoplastic or absent bilaterally. Anatomic variants: As described IMPRESSION: MRI brain: 1. Scattered acute infarcts within the left MCA and MCA/PCA watershed territories affecting the cortical/subcortical left frontal,  parietal and occipital lobes. Multiple acute infarcts are also present within the left insula and basal ganglia. No significant mass effect. No hemorrhagic conversion. 2. Suspected small chronic cortical infarct within the right occipital lobe. 3. Mild generalized parenchymal atrophy. MRA head: 1. Mild artifact arising from a stent within the M1/M2 left MCA. The M1 left MCA is now patent. Atherosclerotic irregularity of the M2 and more distal left MCA vessels. Most notably, there is apparent high-grade stenosis within an inferior division proximal left M2 vessel. 2. Redemonstrated moderate stenosis within the proximal M1 right MCA. 3. Redemonstrated sites of severe stenosis within the P2 right PCA. 4. Redemonstrated sites of up to moderate/severe stenosis within the P2 left PCA. Electronically Signed   By: Kellie Simmering DO   On: 12/17/2020 16:01   MR BRAIN WO CONTRAST  Result Date: 12/17/2020 CLINICAL DATA:  Stroke, follow-up. EXAM: MRI HEAD WITHOUT CONTRAST MRA HEAD WITHOUT CONTRAST TECHNIQUE: Multiplanar, multi-echo pulse sequences of the brain and surrounding structures were acquired without intravenous contrast. Angiographic images of the Circle of Willis were acquired using MRA technique without intravenous contrast. COMPARISON: No pertinent prior exam. COMPARISON:  Noncontrast head CT 12/16/2020. CT angiogram head/neck and CT perfusion 12/16/2020. FINDINGS: MRI HEAD FINDINGS Brain: Mild intermittent motion degradation. Mild cerebral and cerebellar atrophy. Scattered acute infarcts within the left MCA and MCA/PCA watershed territories affecting the cortical/subcortical left frontal, parietal and occipital lobes. Multiple acute infarcts are also present within the left insula and left basal ganglia. Suspected small chronic cortical infarct within the right occipital lobe (series 8, image 10). No evidence of intracranial mass. No chronic intracranial blood products. No extra-axial fluid collection. No  midline  shift. Vascular: Expected proximal arterial flow voids. SWI signal loss along the course of the M1 and M2 left MCA likely reflecting stent artifact. Skull and upper cervical spine: No focal marrow lesion. Sinuses/Orbits: Visualized orbits show no acute finding. Trace bilateral ethmoid and maxillary sinus mucosal thickening. MRA HEAD FINDINGS Anterior circulation: The intracranial internal carotid arteries are patent. Mild stent artifact from a stent within the M1/M2 left MCA. The M1 left middle cerebral artery is now patent. Atherosclerotic irregularity of the M2 and more distal left MCA vessels. Most notably, there are sites of apparent high-grade stenosis within an inferior division proximal left M2 vessel. No proximal left M2 branch occlusion is identified. The M1 right MCA is patent. Redemonstrated moderate stenosis within the proximal right M 1 segment. No right M2 proximal branch occlusion or high-grade proximal stenosis. The anterior cerebral arteries are patent. No intracranial aneurysm is identified. Posterior circulation: The intracranial vertebral arteries are patent. The basilar artery is patent. The posterior cerebral arteries are patent. Redemonstrated atherosclerotic irregularity of the bilateral posterior cerebral arteries, most notably as follows. Multifocal severe stenoses within the P2 right PCA. Sites of up to moderate/severe stenosis within the P2 left PCA. Posterior communicating arteries are hypoplastic or absent bilaterally. Anatomic variants: As described IMPRESSION: MRI brain: 1. Scattered acute infarcts within the left MCA and MCA/PCA watershed territories affecting the cortical/subcortical left frontal, parietal and occipital lobes. Multiple acute infarcts are also present within the left insula and basal ganglia. No significant mass effect. No hemorrhagic conversion. 2. Suspected small chronic cortical infarct within the right occipital lobe. 3. Mild generalized parenchymal atrophy. MRA  head: 1. Mild artifact arising from a stent within the M1/M2 left MCA. The M1 left MCA is now patent. Atherosclerotic irregularity of the M2 and more distal left MCA vessels. Most notably, there is apparent high-grade stenosis within an inferior division proximal left M2 vessel. 2. Redemonstrated moderate stenosis within the proximal M1 right MCA. 3. Redemonstrated sites of severe stenosis within the P2 right PCA. 4. Redemonstrated sites of up to moderate/severe stenosis within the P2 left PCA. Electronically Signed   By: Kellie Simmering DO   On: 12/17/2020 16:01   CT CEREBRAL PERFUSION W CONTRAST  Result Date: 12/16/2020 CLINICAL DATA:  Right-sided deficits EXAM: CT ANGIOGRAPHY HEAD AND NECK CT PERFUSION BRAIN TECHNIQUE: Multidetector CT imaging of the head and neck was performed using the standard protocol during bolus administration of intravenous contrast. Multiplanar CT image reconstructions and MIPs were obtained to evaluate the vascular anatomy. Carotid stenosis measurements (when applicable) are obtained utilizing NASCET criteria, using the distal internal carotid diameter as the denominator. Multiphase CT imaging of the brain was performed following IV bolus contrast injection. Subsequent parametric perfusion maps were calculated using RAPID software. CONTRAST:  112mL OMNIPAQUE IOHEXOL 350 MG/ML SOLN COMPARISON:  None. FINDINGS: CTA NECK FINDINGS SKELETON: There is no bony spinal canal stenosis. No lytic or blastic lesion. OTHER NECK: Normal pharynx, larynx and major salivary glands. No cervical lymphadenopathy. Unremarkable thyroid gland. UPPER CHEST: No pneumothorax or pleural effusion. No nodules or masses. AORTIC ARCH: There is no calcific atherosclerosis of the aortic arch. There is no aneurysm, dissection or hemodynamically significant stenosis of the visualized portion of the aorta. Conventional 3 vessel aortic branching pattern. The visualized proximal subclavian arteries are widely patent. RIGHT  CAROTID SYSTEM: Normal without aneurysm, dissection or stenosis. LEFT CAROTID SYSTEM: Normal without aneurysm, dissection or stenosis. VERTEBRAL ARTERIES: Left dominant configuration. Both origins are clearly patent. There is  no dissection, occlusion or flow-limiting stenosis to the skull base (V1-V3 segments). CTA HEAD FINDINGS POSTERIOR CIRCULATION: --Vertebral arteries: Normal V4 segments. --Inferior cerebellar arteries: Normal. --Basilar artery: Normal. --Superior cerebellar arteries: Normal. --Posterior cerebral arteries (PCA): Multifocal moderate-to-severe stenosis of the right P2 segment. Normal left. ANTERIOR CIRCULATION: --Intracranial internal carotid arteries: Normal. --Anterior cerebral arteries (ACA): Normal. Both A1 segments are present. Patent anterior communicating artery (a-comm). --Middle cerebral arteries (MCA): Complete occlusion of the left MCA at the proximal M1 segment with limited collateralization. Right MCA is normal. VENOUS SINUSES: As permitted by contrast timing, patent. ANATOMIC VARIANTS: None Review of the MIP images confirms the above findings. CT Brain Perfusion Findings: ASPECTS: 9 CBF (<30%) Volume: 46mL Perfusion (Tmax>6.0s) volume: 121mL Mismatch Volume: 164mL, likely slightly overestimated due to small area of completed infarction seen on the noncontrast head CT. Infarction Location:Left MCA territory IMPRESSION: 1. 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 2. Large area of ischemic penumbra in the left MCA territory. 3. 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 Critical Value/emergent results were called by telephone at the time of interpretation on 12/16/2020 at 9:02 pm to provider Christ Kick , who verbally acknowledged these results. Aortic Atherosclerosis (ICD10-I70.0). Electronically Signed   By: Ulyses Jarred M.D.   On: 12/16/2020 21:08   DG  CHEST PORT 1 VIEW  Result Date: 12/17/2020 CLINICAL DATA:  Intubation. EXAM: PORTABLE CHEST 1 VIEW COMPARISON:  12/12/2020. FINDINGS: Endotracheal tube tip noted 4 cm above the carina. NG tube tip below left hemidiaphragm. Prior CABG. Heart size normal. Low lung volumes. Mild left base atelectasis/infiltrate cannot be excluded. Mild elevation left hemidiaphragm. No pleural effusion or pneumothorax. Prior cervical spine fusion. IMPRESSION: 1.  Endotracheal tube and NG tube in good anatomic position. 2.  Prior CABG.  Heart size normal. 3. Mild left base atelectasis/infiltrate cannot be excluded. Mild elevation left hemidiaphragm. Electronically Signed   By: Marcello Moores  Register   On: 12/17/2020 05:18   EEG adult  Result Date: 12/17/2020 Lora Havens, MD     12/17/2020  1:00 PM Patient Name: Melvin Taylor MRN: [id number] Epilepsy Attending: Lora Havens Referring Physician/Provider: Dr. 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  EEG to evaluate for seizures. Level of alertness: Awake AEDs during EEG study: Lamotrigine, Depakote Technical aspects: This EEG study was done with scalp electrodes positioned according to the 10-20 International system of electrode placement. Electrical activity was acquired at a sampling rate of 500Hz  and reviewed with a high frequency filter of 70Hz  and a low frequency filter of 1Hz . EEG data were recorded continuously and digitally stored. Description: The posterior dominant rhythm consists of 8 Hz activity of moderate voltage (25-35 uV) seen predominantly in posterior head regions, symmetric and reactive to eye opening and eye closing. EEG showed intermittent 3 to 6 Hz theta-delta slowing in left hemisphere.  Photic driving was not seen during photic stimulation. Hyperventilation was not performed.   ABNORMALITY -  Intermittent slow, left hemisphere IMPRESSION: This study is suggestive of cortical dysfunction arising from left hemisphere, nonspecific etiology. No seizures or epileptiform discharges were seen throughout the recording. Lora Havens   ECHOCARDIOGRAM COMPLETE  Result Date: 12/17/2020    ECHOCARDIOGRAM REPORT   Patient Name:   Melvin Taylor Date of Exam: 12/17/2020 Medical Rec #:  [id number]         Height:       67.0 in Accession #:    [id number]        Weight:       157.0 lb Date of Birth:  1966/04/05          BSA:          1.825 m Patient Age:    21 years          BP:           96/70 mmHg Patient Gender: M                 HR:           60 bpm. Exam Location:  Inpatient Procedure: 2D Echo, Cardiac Doppler and Color Doppler Indications:    CVA  History:        Patient has prior history of Echocardiogram examinations, most                 recent 03/04/2019. Previous Myocardial Infarction and CAD, Prior                 CABG, Signs/Symptoms:Shortness of Breath; Risk Factors:Diabetes.                 03/28/2016 CABG                 03/07/2016 cath.  Sonographer:    Luisa Hart RDCS Referring Phys: [id number] Lorenza Chick  Sonographer Comments: Echo performed with patient supine and on artificial respirator. IMPRESSIONS  1. Left ventricular ejection fraction, by estimation, is 65 to 70%. The left ventricle has normal function. The left ventricle has no regional wall motion abnormalities. There is moderate left ventricular hypertrophy. Left ventricular diastolic parameters are consistent with Grade I diastolic dysfunction (impaired relaxation).  2. Right ventricular systolic function is normal. The right ventricular size is normal. There is normal pulmonary artery systolic pressure. The estimated right ventricular systolic pressure is Q000111Q mmHg.  3. The mitral valve is normal in structure. Trivial mitral valve regurgitation. No evidence of mitral stenosis.  4. The aortic valve is normal in structure. Aortic valve  regurgitation is not visualized. No aortic stenosis is present.  5. Aortic dilatation noted. There is borderline dilatation of the ascending aorta, measuring 38 mm.  6. The inferior vena cava is normal in size with greater than 50% respiratory variability, suggesting right atrial pressure of 3 mmHg. Conclusion(s)/Recommendation(s): No intracardiac source of embolism detected on this transthoracic study. A transesophageal echocardiogram is recommended to exclude cardiac source of embolism if clinically indicated. FINDINGS  Left Ventricle: Left ventricular ejection fraction, by estimation, is 65 to 70%. The left ventricle has normal function. The left ventricle has no regional wall motion abnormalities. The left ventricular internal cavity size was normal in size. There is  moderate left ventricular hypertrophy. Left ventricular diastolic parameters are consistent with Grade I diastolic dysfunction (impaired relaxation). Right Ventricle: The right ventricular size is normal. No increase in right ventricular wall thickness. Right ventricular systolic function is normal. There is normal pulmonary artery systolic pressure. The tricuspid regurgitant velocity is 2.14 m/s, and  with an assumed right atrial pressure of 3 mmHg, the estimated right ventricular systolic pressure is Q000111Q mmHg. Left Atrium: Left atrial size was normal in size. Right Atrium: Right atrial size was normal in size. Pericardium: There is no evidence of pericardial effusion. Mitral Valve: The mitral valve is normal in structure. Trivial mitral  valve regurgitation. No evidence of mitral valve stenosis. MV peak gradient, 8.0 mmHg. The mean mitral valve gradient is 3.0 mmHg. Tricuspid Valve: The tricuspid valve is normal in structure. Tricuspid valve regurgitation is mild . No evidence of tricuspid stenosis. Aortic Valve: The aortic valve is normal in structure. Aortic valve regurgitation is not visualized. No aortic stenosis is present. Aortic valve mean  gradient measures 3.0 mmHg. Aortic valve peak gradient measures 6.2 mmHg. Aortic valve area, by VTI measures 3.55 cm. Pulmonic Valve: The pulmonic valve was thickened with good excursion. Pulmonic valve regurgitation is mild. No evidence of pulmonic stenosis. Aorta: Aortic dilatation noted. There is borderline dilatation of the ascending aorta, measuring 38 mm. Venous: The inferior vena cava is normal in size with greater than 50% respiratory variability, suggesting right atrial pressure of 3 mmHg. IAS/Shunts: There is redundancy of the interatrial septum. No atrial level shunt detected by color flow Doppler.  LEFT VENTRICLE PLAX 2D LVIDd:         3.50 cm  Diastology LVIDs:         1.70 cm  LV e' medial:    7.40 cm/s LV PW:         1.50 cm  LV E/e' medial:  11.9 LV IVS:        1.50 cm  LV e' lateral:   9.36 cm/s LVOT diam:     2.20 cm  LV E/e' lateral: 9.4 LV SV:         95 LV SV Index:   52 LVOT Area:     3.80 cm  RIGHT VENTRICLE RV Basal diam:  4.30 cm RV Mid diam:    2.60 cm LEFT ATRIUM             Index       RIGHT ATRIUM           Index LA diam:        3.20 cm 1.75 cm/m  RA Area:     11.60 cm LA Vol (A2C):   24.9 ml 13.65 ml/m RA Volume:   26.10 ml  14.30 ml/m LA Vol (A4C):   33.7 ml 18.47 ml/m LA Biplane Vol: 30.6 ml 16.77 ml/m  AORTIC VALVE                   PULMONIC VALVE AV Area (Vmax):    3.25 cm    PV Vmax:       0.76 m/s AV Area (Vmean):   3.28 cm    PV Vmean:      50.700 cm/s AV Area (VTI):     3.55 cm    PV VTI:        0.159 m AV Vmax:           125.00 cm/s PV Peak grad:  2.3 mmHg AV Vmean:          83.600 cm/s PV Mean grad:  1.0 mmHg AV VTI:            0.269 m AV Peak Grad:      6.2 mmHg AV Mean Grad:      3.0 mmHg LVOT Vmax:         107.00 cm/s LVOT Vmean:        72.200 cm/s LVOT VTI:          0.251 m LVOT/AV VTI ratio: 0.93  AORTA Ao Root diam: 3.70 cm Ao Asc diam:  3.80 cm MITRAL VALVE  TRICUSPID VALVE MV Area (PHT): 2.76 cm     TR Peak grad:   18.3 mmHg MV Area VTI:    2.07 cm     TR Vmax:        214.00 cm/s MV Peak grad:  8.0 mmHg MV Mean grad:  3.0 mmHg     SHUNTS MV Vmax:       1.41 m/s     Systemic VTI:  0.25 m MV Vmean:      80.8 cm/s    Systemic Diam: 2.20 cm MV Decel Time: 275 msec MV E velocity: 88.10 cm/s MV A velocity: 109.00 cm/s MV E/A ratio:  0.81 Cherlynn Kaiser MD Electronically signed by Cherlynn Kaiser MD Signature Date/Time: 12/17/2020/1:53:18 PM    Final    CT HEAD CODE STROKE WO CONTRAST  Result Date: 12/16/2020 CLINICAL DATA:  Code stroke.  Right-sided deficits EXAM: CT HEAD WITHOUT CONTRAST TECHNIQUE: Contiguous axial images were obtained from the base of the skull through the vertex without intravenous contrast. COMPARISON:  12/12/2020 FINDINGS: Brain: There is no mass, hemorrhage or extra-axial collection. The size and configuration of the ventricles and extra-axial CSF spaces are normal. Area of hypoattenuation in the inferior left frontal lobe, likely early subacute infarct. Small vessel infarcts in the left corona radiata are unchanged. Vascular: No abnormal hyperdensity of the major intracranial arteries or dural venous sinuses. No intracranial atherosclerosis. Skull: The visualized skull base, calvarium and extracranial soft tissues are normal. Sinuses/Orbits: No fluid levels or advanced mucosal thickening of the visualized paranasal sinuses. No mastoid or middle ear effusion. The orbits are normal. ASPECTS [facility] Stroke Program Early CT Score) - Ganglionic level infarction (caudate, lentiform nuclei, internal capsule, insula, M1-M3 cortex): 6 - Supraganglionic infarction (M4-M6 cortex): 3 Total score (0-10 with 10 being normal): 9 IMPRESSION: 1. No acute intracranial hemorrhage. 2. Suspected early subacute infarct of the inferior left frontal lobe. 3. ASPECTS is 9. These results were called by telephone at the time of interpretation on 12/16/2020 at 8:25 pm to provider [facility] , who verbally acknowledged these results. Electronically Signed    By: Ulyses Jarred M.D.   On: 12/16/2020 20:26   CT ANGIO HEAD CODE STROKE  Result Date: 12/16/2020 CLINICAL DATA:  Right-sided deficits EXAM: CT ANGIOGRAPHY HEAD AND NECK CT PERFUSION BRAIN TECHNIQUE: Multidetector CT imaging of the head and neck was performed using the standard protocol during bolus administration of intravenous contrast. Multiplanar CT image reconstructions and MIPs were obtained to evaluate the vascular anatomy. Carotid stenosis measurements (when applicable) are obtained utilizing NASCET criteria, using the distal internal carotid diameter as the denominator. Multiphase CT imaging of the brain was performed following IV bolus contrast injection. Subsequent parametric perfusion maps were calculated using RAPID software. CONTRAST:  179mL OMNIPAQUE IOHEXOL 350 MG/ML SOLN COMPARISON:  None. FINDINGS: CTA NECK FINDINGS SKELETON: There is no bony spinal canal stenosis. No lytic or blastic lesion. OTHER NECK: Normal pharynx, larynx and major salivary glands. No cervical lymphadenopathy. Unremarkable thyroid gland. UPPER CHEST: No pneumothorax or pleural effusion. No nodules or masses. AORTIC ARCH: There is no calcific atherosclerosis of the aortic arch. There is no aneurysm, dissection or hemodynamically significant stenosis of the visualized portion of the aorta. Conventional 3 vessel aortic branching pattern. The visualized proximal subclavian arteries are widely patent. RIGHT CAROTID SYSTEM: Normal without aneurysm, dissection or stenosis. LEFT CAROTID SYSTEM: Normal without aneurysm, dissection or stenosis. VERTEBRAL ARTERIES: Left dominant configuration. Both origins are clearly patent. There is no dissection, occlusion or flow-limiting  stenosis to the skull base (V1-V3 segments). CTA HEAD FINDINGS POSTERIOR CIRCULATION: --Vertebral arteries: Normal V4 segments. --Inferior cerebellar arteries: Normal. --Basilar artery: Normal. --Superior cerebellar arteries: Normal. --Posterior cerebral  arteries (PCA): Multifocal moderate-to-severe stenosis of the right P2 segment. Normal left. ANTERIOR CIRCULATION: --Intracranial internal carotid arteries: Normal. --Anterior cerebral arteries (ACA): Normal. Both A1 segments are present. Patent anterior communicating artery (a-comm). --Middle cerebral arteries (MCA): Complete occlusion of the left MCA at the proximal M1 segment with limited collateralization. Right MCA is normal. VENOUS SINUSES: As permitted by contrast timing, patent. ANATOMIC VARIANTS: None Review of the MIP images confirms the above findings. CT Brain Perfusion Findings: ASPECTS: 9 CBF (<30%) Volume: 22mL Perfusion (Tmax>6.0s) volume: 169mL Mismatch Volume: 127mL, likely slightly overestimated due to small area of completed infarction seen on the noncontrast head CT. Infarction Location:Left MCA territory IMPRESSION: 1. 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 2. Large area of ischemic penumbra in the left MCA territory. 3. 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 Critical Value/emergent results were called by telephone at the time of interpretation on 12/16/2020 at 9:02 pm to provider Christ Kick , who verbally acknowledged these results. Aortic Atherosclerosis (ICD10-I70.0). Electronically Signed   By: Ulyses Jarred M.D.   On: 12/16/2020 21:08   CT ANGIO NECK CODE STROKE  Result Date: 12/16/2020 CLINICAL DATA:  Right-sided deficits EXAM: CT ANGIOGRAPHY HEAD AND NECK CT PERFUSION BRAIN TECHNIQUE: Multidetector CT imaging of the head and neck was performed using the standard protocol during bolus administration of intravenous contrast. Multiplanar CT image reconstructions and MIPs were obtained to evaluate the vascular anatomy. Carotid stenosis measurements (when applicable) are obtained utilizing NASCET criteria, using the distal internal carotid diameter as  the denominator. Multiphase CT imaging of the brain was performed following IV bolus contrast injection. Subsequent parametric perfusion maps were calculated using RAPID software. CONTRAST:  152mL OMNIPAQUE IOHEXOL 350 MG/ML SOLN COMPARISON:  None. FINDINGS: CTA NECK FINDINGS SKELETON: There is no bony spinal canal stenosis. No lytic or blastic lesion. OTHER NECK: Normal pharynx, larynx and major salivary glands. No cervical lymphadenopathy. Unremarkable thyroid gland. UPPER CHEST: No pneumothorax or pleural effusion. No nodules or masses. AORTIC ARCH: There is no calcific atherosclerosis of the aortic arch. There is no aneurysm, dissection or hemodynamically significant stenosis of the visualized portion of the aorta. Conventional 3 vessel aortic branching pattern. The visualized proximal subclavian arteries are widely patent. RIGHT CAROTID SYSTEM: Normal without aneurysm, dissection or stenosis. LEFT CAROTID SYSTEM: Normal without aneurysm, dissection or stenosis. VERTEBRAL ARTERIES: Left dominant configuration. Both origins are clearly patent. There is no dissection, occlusion or flow-limiting stenosis to the skull base (V1-V3 segments). CTA HEAD FINDINGS POSTERIOR CIRCULATION: --Vertebral arteries: Normal V4 segments. --Inferior cerebellar arteries: Normal. --Basilar artery: Normal. --Superior cerebellar arteries: Normal. --Posterior cerebral arteries (PCA): Multifocal moderate-to-severe stenosis of the right P2 segment. Normal left. ANTERIOR CIRCULATION: --Intracranial internal carotid arteries: Normal. --Anterior cerebral arteries (ACA): Normal. Both A1 segments are present. Patent anterior communicating artery (a-comm). --Middle cerebral arteries (MCA): Complete occlusion of the left MCA at the proximal M1 segment with limited collateralization. Right MCA is normal. VENOUS SINUSES: As permitted by contrast timing, patent. ANATOMIC VARIANTS: None Review of the MIP images confirms the above findings. CT Brain  Perfusion Findings: ASPECTS: 9 CBF (<30%) Volume: 57mL Perfusion (Tmax>6.0s) volume: 136mL Mismatch Volume: 168mL, likely slightly overestimated due to small area of completed infarction seen on the noncontrast  head CT. Infarction Location:Left MCA territory IMPRESSION: 1. 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 2. Large area of ischemic penumbra in the left MCA territory. 3. 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 Critical Value/emergent results were called by telephone at the time of interpretation on 12/16/2020 at 9:02 pm to provider Christ Kick , who verbally acknowledged these results. Aortic Atherosclerosis (ICD10-I70.0). Electronically Signed   By: Ulyses Jarred M.D.   On: 12/16/2020 21:08    ***  Lavon Paganini [place], PA-C 12/18/2020

## 2020-12-18 NOTE — Progress Notes (Signed)
Occupational Therapy Evaluation  PTA pt lives alone and has a PCA 6 days/wk, 4 hrs/day who assists with IADL tasks, including cooking, cleaning and medication management. Family assists as well and manages his finances. At baseline pt is independent with ADL tasks and does some of his cooking and cleaning/laundry and likes to walk and go fishing. Pt demonstrates a decline in his functional level of independence due to deficits listed below and would benefit from rehab at [facility]. Spoke with sister over the phone who states they could most likely provide more assistance after DC. Will follow acutely.    12/18/20 1000  OT Visit Information  Last OT Received On 12/18/20  PT/OT/SLP Co-Evaluation/Treatment Yes (parttial session)  Reason for Co-Treatment For patient/therapist safety;Necessary to address cognition/behavior during functional activity  OT goals addressed during session ADL's and self-care  History of Present Illness 55 y.o.brought in by EMS for code stroke. Witnessed seizure by family. Transferred to [facility] from [facility]. Underwent thrombectomy/IR for L M1 occlusion. MRI + scattered acute infarcts L MCA adn PCA watershed territories affecting the cortical/subcortical frontal, parietal and occipital lobes. Mulitple acute infarcts within the L insula and basal ganglia.  PMH significant for  DM, HTN, HL, seizures, CAD, polysubstance abuse, CKD stage 3, BiPolar disorder and schizophrenia.  Precautions  Precautions Fall  Home Living  Family/patient expects to be discharged to: Private residence  Living Arrangements Alone  Available Help at Discharge Family;Personal care attendant (PCA 4 hrs/day 6 days/wk)  Type of Home Apartment  Home Access Level entry  Home Layout One level  Bathroom Shower/Tub Biochemist, clinical Yes  How Accessible Accessible via walker  Roy - 2 wheels  Prior Function  Level of Independence Needs  assistance;Independent;Independent with assistive device(s)  Gait / Transfers Assistance Needed ambulated independently  ADL's / Colby I with ADL, assist with IADL tasks form PCA for medicaiton managment, cooking, some shopping; family also assisted. Pt does some cooking himself and occasionally does his own laundry. L:ikes to walk  Communication / Swallowing Assistance Needed [facility]  Communication  Communication Expressive difficulties  Pain Assessment  Pain Assessment Faces  Faces Pain Scale 0  Cognition  Arousal/Alertness Awake/alert  Behavior During Therapy Flat affect;Impulsive  Overall Cognitive Status Impaired/Different from baseline  Area of Impairment Orientation;Attention;Memory;Following commands;Safety/judgement;Awareness;Problem solving  Orientation Level Disoriented to;Place;Time (aware he had a stroke)  Current Attention Level Selective  Memory Decreased recall of precautions;Decreased short-term memory  Following Commands Follows one step commands with increased time  Safety/Judgement Decreased awareness of safety;Decreased awareness of deficits  Awareness Intellectual  Problem Solving Slow processing;Difficulty sequencing;Requires verbal cues  Upper Extremity Assessment  Upper Extremity Assessment RUE deficits/detail  RUE Deficits / Details appears to demonstrate apparent sensory motor deficits; using funcitonally but decreased fine-motor and in-hand manipulation skills  RUE Sensation decreased light touch (unablet o distinguish correct location when touched)  RUE Coordination decreased fine motor;decreased gross motor  Lower Extremity Assessment  Lower Extremity Assessment Defer to PT evaluation  Cervical / Trunk Assessment  Cervical / Trunk Assessment Normal  ADL  Overall ADL's  Needs assistance/impaired  Eating/Feeding Set up;Sitting  Grooming Set up;Sitting  Upper Body Bathing Set up;Sitting  Lower Body Bathing Minimal assistance;Sit  to/from stand  Upper Body Dressing  Minimal assistance;Sitting  Lower Body Dressing Minimal assistance;Sit to/from Retail buyer Minimal assistance;+2 for safety/equipment;RW  Toileting- Water quality scientist and Hygiene Total assistance  Toileting - Clothing Manipulation Details (indicate  cue type and reason) condom cath  Functional mobility during ADLs Minimal assistance;+2 for safety/equipment;Rolling walker;Cueing for safety  Vision- History  Baseline Vision/History Wears glasses  Wears Glasses At all times  Patient Visual Report Blurring of vision  Vision- Assessment  Vision Assessment? Vision impaired- to be further tested in functional context  Additional Comments Will further assess; ? field deficits in R visual field  Perception  Comments will further assess  Praxis  Praxis tested? Deficits  Deficits  (sensory motor difficulty at times)  Bed Mobility  Overal bed mobility Needs Assistance  Bed Mobility Supine to Sit  Supine to sit Min guard  Transfers  Overall transfer level Needs assistance  Equipment used Rolling walker (2 wheeled)  Transfers Sit to/from Stand  Sit to Stand Min assist;+2 safety/equipment  Balance  Overall balance assessment Needs assistance  Sitting balance-Leahy Scale Good  Standing balance-Leahy Scale Poor  OT - End of Session  Equipment Utilized During Treatment Gait belt;Rolling walker  Activity Tolerance Patient tolerated treatment well  Patient left in chair;with call bell/phone within reach;with chair alarm set;Other (comment) (working with PT)  Nurse Communication Mobility status  OT Assessment  OT Recommendation/Assessment Patient needs continued OT Services  OT Visit Diagnosis Unsteadiness on feet (R26.81);Other abnormalities of gait and mobility (R26.89);Muscle weakness (generalized) (M62.81);Other symptoms and signs involving cognitive function;Apraxia (R48.2)  OT Problem List Decreased strength;Decreased activity  tolerance;Impaired balance (sitting and/or standing);Impaired vision/perception;Decreased coordination;Decreased cognition;Decreased safety awareness;Decreased knowledge of use of DME or AE;Impaired sensation;Impaired UE functional use  OT Recommendation  Recommendations for Other Services Rehab consult  Follow Up Recommendations [facility];Supervision/Assistance - 24 hour  Individuals Consulted  Consulted and Agree with Results and Recommendations Family member/caregiver;Patient (sister)  Family Member Consulted sister  Acute Rehab OT Goals  Patient Stated Goal to get better  OT Goal Formulation With patient/family  Time For Goal Achievement 01/01/21  Potential to Achieve Goals Good  OT Time Calculation  OT Start Time (ACUTE ONLY) 1005  OT Stop Time (ACUTE ONLY) 1031  OT Time Calculation (min) 26 min  OT General Charges  $OT Visit 1 Visit  OT Evaluation  $OT Eval Moderate Complexity 1 Mod  Written Expression  Dominant Hand Right  Maurie Boettcher, OT/L   Acute OT Clinical Specialist Acute Rehabilitation Services Pager [phone] Office [phone]

## 2020-12-19 ENCOUNTER — Inpatient Hospital Stay (HOSPITAL_COMMUNITY): Payer: Medicaid Other

## 2020-12-19 DIAGNOSIS — I63512 Cerebral infarction due to unspecified occlusion or stenosis of left middle cerebral artery: Secondary | ICD-10-CM | POA: Diagnosis not present

## 2020-12-19 DIAGNOSIS — I729 Aneurysm of unspecified site: Secondary | ICD-10-CM

## 2020-12-19 LAB — GLUCOSE, CAPILLARY
Glucose-Capillary: 139 mg/dL — ABNORMAL HIGH (ref 70–99)
Glucose-Capillary: 150 mg/dL — ABNORMAL HIGH (ref 70–99)
Glucose-Capillary: 216 mg/dL — ABNORMAL HIGH (ref 70–99)
Glucose-Capillary: 231 mg/dL — ABNORMAL HIGH (ref 70–99)
Glucose-Capillary: 227 mg/dL — ABNORMAL HIGH (ref 70–99)
Glucose-Capillary: 202 mg/dL — ABNORMAL HIGH (ref 70–99)

## 2020-12-19 LAB — BASIC METABOLIC PANEL
Anion gap: 4 — ABNORMAL LOW (ref 5–15)
BUN: 23 mg/dL — ABNORMAL HIGH (ref 6–20)
CO2: 21 mmol/L — ABNORMAL LOW (ref 22–32)
Calcium: 7.9 mg/dL — ABNORMAL LOW (ref 8.9–10.3)
Chloride: 111 mmol/L (ref 98–111)
Creatinine, Ser: 1.71 mg/dL — ABNORMAL HIGH (ref 0.61–1.24)
GFR, Estimated: 46 mL/min — ABNORMAL LOW (ref >60)
Glucose, Bld: 160 mg/dL — ABNORMAL HIGH (ref 70–99)
Potassium: 4.2 mmol/L (ref 3.5–5.1)
Sodium: 136 mmol/L (ref 135–145)

## 2020-12-19 LAB — CBC WITH DIFFERENTIAL/PLATELET
Abs Immature Granulocytes: 0.01 10*3/uL (ref 0.00–0.07)
Basophils Absolute: 0 10*3/uL (ref 0.0–0.1)
Basophils Relative: 0 %
Eosinophils Absolute: 0 10*3/uL (ref 0.0–0.5)
Eosinophils Relative: 0 %
HCT: 26.5 % — ABNORMAL LOW (ref 39.0–52.0)
Hemoglobin: 9.3 g/dL — ABNORMAL LOW (ref 13.0–17.0)
Immature Granulocytes: 0 %
Lymphocytes Relative: 24 %
Lymphs Abs: 1.1 10*3/uL (ref 0.7–4.0)
MCH: 31.3 pg (ref 26.0–34.0)
MCHC: 35.1 g/dL (ref 30.0–36.0)
MCV: 89.2 fL (ref 80.0–100.0)
Monocytes Absolute: 0.5 10*3/uL (ref 0.1–1.0)
Monocytes Relative: 12 %
Neutro Abs: 2.9 10*3/uL (ref 1.7–7.7)
Neutrophils Relative %: 64 %
Platelets: 126 10*3/uL — ABNORMAL LOW (ref 150–400)
RBC: 2.97 MIL/uL — ABNORMAL LOW (ref 4.22–5.81)
RDW: 12.4 % (ref 11.5–15.5)
WBC: 4.6 10*3/uL (ref 4.0–10.5)
nRBC: 0 % (ref 0.0–0.2)

## 2020-12-19 LAB — CBC
HCT: 30.3 % — ABNORMAL LOW (ref 39.0–52.0)
Hemoglobin: 10.5 g/dL — ABNORMAL LOW (ref 13.0–17.0)
MCH: 31.3 pg (ref 26.0–34.0)
MCHC: 34.7 g/dL (ref 30.0–36.0)
MCV: 90.2 fL (ref 80.0–100.0)
Platelets: 129 10*3/uL — ABNORMAL LOW (ref 150–400)
RBC: 3.36 MIL/uL — ABNORMAL LOW (ref 4.22–5.81)
RDW: 12.3 % (ref 11.5–15.5)
WBC: 4.5 10*3/uL (ref 4.0–10.5)
nRBC: 0 % (ref 0.0–0.2)

## 2020-12-19 LAB — VALPROIC ACID LEVEL: Valproic Acid Lvl: 52 ug/mL (ref 50.0–100.0)

## 2020-12-19 NOTE — Progress Notes (Signed)
Limited right lower extremity arterial duplex completed. Refer to "CV Proc" under chart review to view preliminary results.  12/19/2020 3:55 PM Kelby Aline., MHA, RVT, RDCS, RDMS

## 2020-12-19 NOTE — Progress Notes (Signed)
Inpatient Rehab Admissions:  Inpatient Rehab Consult received.  I met with patient at the bedside for rehabilitation assessment and to discuss goals and expectations of an inpatient rehab admission.  Pt acknowledged understanding of goals and expectations. Pt told AC he would prefer to received therapy at home. TOC made aware.  Signed: Gayland Curry, [place], [place] Admissions Coordinator [phone]

## 2020-12-19 NOTE — Progress Notes (Signed)
STROKE TEAM PROGRESS NOTE   SUBJECTIVE (INTERVAL HISTORY) No acute events overnight.  He complains of pain in his right groin and tenderness to touch.  Hematocrit this morning of had dropped to 26.5 plan to repeat at noon today vital signs are stable.  Neurological exam unchanged. Today he reports he is fine, no new symptoms. His affect is flat. He provides one word answers. He does not engage in conversation. We discussed the plan of care including discharge to CIR. Patient stated he did not want to go rehab. We discussed the reasoning behind CIR recommendation. He is willing to consider it.   Diabetic acid level is low normal at 52 mg percent.  He has not had any further seizures.  OBJECTIVE Temp:  [97 F (36.1 C)-98.3 F (36.8 C)] 98.3 F (36.8 C) (05/21 1142) Pulse Rate:  [54-73] 55 (05/21 1142) Cardiac Rhythm: Normal sinus rhythm (05/21 0821) Resp:  [16-19] 16 (05/21 1142) BP: (143-176)/(71-88) 176/88 (05/21 1142) SpO2:  [100 %] 100 % (05/21 1142)  Recent Labs  Lab 12/18/20 1646 12/18/20 2122 12/19/20 0632 12/19/20 0756 12/19/20 1136  GLUCAP 231* 163* 139* 150* 216*   Recent Labs  Lab 12/15/20 0929 12/15/20 1341 12/16/20 2028 12/16/20 2038 12/17/20 0237 12/17/20 0254 12/18/20 0429 12/19/20 0047  NA 137  --  135 139 139 136 138 136  K 4.6  --  3.7 3.7 3.5 3.7 3.7 4.2  CL 103  --  102 102  --  111 114* 111  CO2 28  --  27  --   --  17* 19* 21*  GLUCOSE 157*  --  175* 159*  --  235* 86 160*  BUN 14  --  22* 21*  --  18 19 23*  CREATININE 1.33*  --  2.15* 2.20*  --  1.75* 1.74* 1.71*  CALCIUM 8.2*  --  7.7*  --   --  7.2* 7.8* 7.9*  MG  --  1.7  --   --   --  1.6* 2.8*  --   PHOS  --   --   --   --   --   --  4.3  --    Recent Labs  Lab 12/15/20 0929 12/16/20 2028  AST 20 16  ALT 13 12  ALKPHOS 41 39  BILITOT 0.7 0.5  PROT 5.5* 5.1*  ALBUMIN 2.8* 2.8*   Recent Labs  Lab 12/15/20 0929 12/16/20 2028 12/16/20 2038 12/17/20 0237 12/17/20 0254  12/18/20 0429 12/19/20 0047 12/19/20 1157  WBC 3.8* 4.2  --   --  5.7 6.2 4.6 4.5  NEUTROABS 1.2* 1.5*  --   --  4.5 4.4 2.9  --   HGB 12.8* 12.0*   < > 11.9* 11.3* 11.1* 9.3* 10.5*  HCT 37.3* 34.5*   < > 35.0* 32.8* 31.5* 26.5* 30.3*  MCV 90.5 91.0  --   --  90.1 89.7 89.2 90.2  PLT 213 183  --   --  173 158 126* 129*   < > = values in this interval not displayed.   No results for input(s): CKTOTAL, CKMB, CKMBINDEX, TROPONINI in the last 168 hours. Recent Labs    12/16/20 2028  LABPROT 13.1  INR 1.0   Recent Labs    12/17/20 0254  COLORURINE YELLOW  LABSPEC 1.038*  PHURINE 6.0  GLUCOSEU 50*  HGBUR SMALL*  BILIRUBINUR NEGATIVE  KETONESUR 5*  PROTEINUR 100*  NITRITE NEGATIVE  LEUKOCYTESUR NEGATIVE  Component Value Date/Time   CHOL 224 (H) 12/17/2020 0254   CHOL 286 (H) 02/12/2020 1334   TRIG 146 12/17/2020 0254   HDL 79 12/17/2020 0254   HDL 118 02/12/2020 1334   CHOLHDL 2.8 12/17/2020 0254   VLDL 29 12/17/2020 0254   LDLCALC 116 (H) 12/17/2020 0254   LDLCALC 145 (H) 02/12/2020 1334   Lab Results  Component Value Date   HGBA1C 9.7 (H) 12/17/2020      Component Value Date/Time   LABOPIA NONE DETECTED 12/17/2020 0254   COCAINSCRNUR NONE DETECTED 12/17/2020 0254   LABBENZ NONE DETECTED 12/17/2020 0254   AMPHETMU NONE DETECTED 12/17/2020 0254   THCU POSITIVE (A) 12/17/2020 0254   LABBARB NONE DETECTED 12/17/2020 0254    Recent Labs  Lab 12/15/20 0930 12/16/20 2028  ETH <10 <10    I have personally reviewed the radiological images below and agree with the radiology interpretations.  DG Sacrum/Coccyx  Result Date: 11/30/2020 CLINICAL DATA:  Recent fall with sacral pain, initial encounter EXAM: SACRUM AND COCCYX - 2+ VIEW COMPARISON:  None. FINDINGS: Sacrum is well visualized. Sacral ala are unremarkable. No acute fracture is seen. IMPRESSION: No acute abnormality noted. Electronically Signed   By: Inez Catalina M.D.   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 EXAM: CT HEAD WITHOUT CONTRAST TECHNIQUE: Contiguous axial images were obtained from the base of the skull through the vertex without intravenous contrast. COMPARISON:  CTA, CTP and CT head yesterday. FINDINGS: Brain: Pseudo normalization of the left inferior frontal gyrus which was hypodense at presentation yesterday (series 3, image 19). Trace subarachnoid hemorrhage in the left central sulcus on series 3, image 24. Trace contrast staining suspected in the left inferior basal ganglia series 3, image 15. And questionable additional mild gyral contrast staining in the anterior left MCA territory. Scattered areas of subcortical and central white matter hypodensity in the left frontal lobe are otherwise stable to decreased in conspicuity (series 3, image 22). But there is no confluent cytotoxic edema evident, and no intracranial mass effect or midline shift. No ventriculomegaly. No other intracranial hemorrhage identified. Vascular: Left MCA M1 region vascular stent is new since presentation. Skull: No acute osseous abnormality identified. Sinuses/Orbits: Trace fluid and mucosal thickening in the paranasal sinuses now. Mastoids and tympanic cavities remain clear. Other: Intubated. Fluid in the nasopharynx. Negative orbit and scalp soft tissues. IMPRESSION: 1. Some pseudo-normalization of the left inferior frontal gyrus hypodensity since the presentation CT yesterday which might be related to contrast staining. But there is otherwise little to no cytotoxic edema evident in the Left MCA territory. 2. Trace subarachnoid hemorrhage in the left hemisphere. No malignant hemorrhagic transformation or intracranial mass effect. 3. New left MCA M1 stent. Electronically Signed   By: Genevie Ann M.D.   On: 12/17/2020 06:15   CT Head Wo Contrast  Result Date:  12/12/2020 CLINICAL DATA:  55 year old with acute onset of headache and possible fever that began yesterday. Current history of hypertension. EXAM: CT HEAD WITHOUT CONTRAST TECHNIQUE: Contiguous axial images were obtained from the base of the skull through the vertex without intravenous contrast. COMPARISON:  06/09/2018 and earlier. FINDINGS: Brain: Ventricular system normal in size and appearance for age. No mass lesion. No midline shift. No acute hemorrhage or hematoma. No extra-axial fluid collections. No evidence of acute infarction. No interval change. Vascular: Mild BILATERAL carotid  siphon atherosclerosis. No hyperdense vessel. Skull: No skull fracture or other focal osseous abnormality involving the skull. Sinuses/Orbits: Visualized paranasal sinuses, bilateral mastoid air cells and bilateral middle ear cavities well-aerated. Visualized orbits and globes normal in appearance. Other: None. IMPRESSION: No acute intracranial abnormality.  Stable examination. Electronically Signed   By: Evangeline Dakin M.D.   On: 12/12/2020 11:57   MR ANGIO HEAD WO CONTRAST  Result Date: 12/17/2020 CLINICAL DATA:  Stroke, follow-up. EXAM: MRI HEAD WITHOUT CONTRAST MRA HEAD WITHOUT CONTRAST TECHNIQUE: Multiplanar, multi-echo pulse sequences of the brain and surrounding structures were acquired without intravenous contrast. Angiographic images of the Circle of Willis were acquired using MRA technique without intravenous contrast. COMPARISON: No pertinent prior exam. COMPARISON:  Noncontrast head CT 12/16/2020. CT angiogram head/neck and CT perfusion 12/16/2020. FINDINGS: MRI HEAD FINDINGS Brain: Mild intermittent motion degradation. Mild cerebral and cerebellar atrophy. Scattered acute infarcts within the left MCA and MCA/PCA watershed territories affecting the cortical/subcortical left frontal, parietal and occipital lobes. Multiple acute infarcts are also present within the left insula and left basal ganglia. Suspected  small chronic cortical infarct within the right occipital lobe (series 8, image 10). No evidence of intracranial mass. No chronic intracranial blood products. No extra-axial fluid collection. No midline shift. Vascular: Expected proximal arterial flow voids. SWI signal loss along the course of the M1 and M2 left MCA likely reflecting stent artifact. Skull and upper cervical spine: No focal marrow lesion. Sinuses/Orbits: Visualized orbits show no acute finding. Trace bilateral ethmoid and maxillary sinus mucosal thickening. MRA HEAD FINDINGS Anterior circulation: The intracranial internal carotid arteries are patent. Mild stent artifact from a stent within the M1/M2 left MCA. The M1 left middle cerebral artery is now patent. Atherosclerotic irregularity of the M2 and more distal left MCA vessels. Most notably, there are sites of apparent high-grade stenosis within an inferior division proximal left M2 vessel. No proximal left M2 branch occlusion is identified. The M1 right MCA is patent. Redemonstrated moderate stenosis within the proximal right M 1 segment. No right M2 proximal branch occlusion or high-grade proximal stenosis. The anterior cerebral arteries are patent. No intracranial aneurysm is identified. Posterior circulation: The intracranial vertebral arteries are patent. The basilar artery is patent. The posterior cerebral arteries are patent. Redemonstrated atherosclerotic irregularity of the bilateral posterior cerebral arteries, most notably as follows. Multifocal severe stenoses within the P2 right PCA. Sites of up to moderate/severe stenosis within the P2 left PCA. Posterior communicating arteries are hypoplastic or absent bilaterally. Anatomic variants: As described IMPRESSION: MRI brain: 1. Scattered acute infarcts within the left MCA and MCA/PCA watershed territories affecting the cortical/subcortical left frontal, parietal and occipital lobes. Multiple acute infarcts are also present within the left  insula and basal ganglia. No significant mass effect. No hemorrhagic conversion. 2. Suspected small chronic cortical infarct within the right occipital lobe. 3. Mild generalized parenchymal atrophy. MRA head: 1. Mild artifact arising from a stent within the M1/M2 left MCA. The M1 left MCA is now patent. Atherosclerotic irregularity of the M2 and more distal left MCA vessels. Most notably, there is apparent high-grade stenosis within an inferior division proximal left M2 vessel. 2. Redemonstrated moderate stenosis within the proximal M1 right MCA. 3. Redemonstrated sites of severe stenosis within the P2 right PCA. 4. Redemonstrated sites of up to moderate/severe stenosis within the P2 left PCA. Electronically Signed   By: Kellie Simmering DO   On: 12/17/2020 16:01   MR BRAIN WO CONTRAST  Result Date: 12/17/2020 CLINICAL DATA:  Stroke, follow-up. EXAM: MRI HEAD WITHOUT CONTRAST MRA HEAD WITHOUT CONTRAST TECHNIQUE: Multiplanar, multi-echo pulse sequences of the brain and surrounding structures were acquired without intravenous contrast. Angiographic images of the Circle of Willis were acquired using MRA technique without intravenous contrast. COMPARISON: No pertinent prior exam. COMPARISON:  Noncontrast head CT 12/16/2020. CT angiogram head/neck and CT perfusion 12/16/2020. FINDINGS: MRI HEAD FINDINGS Brain: Mild intermittent motion degradation. Mild cerebral and cerebellar atrophy. Scattered acute infarcts within the left MCA and MCA/PCA watershed territories affecting the cortical/subcortical left frontal, parietal and occipital lobes. Multiple acute infarcts are also present within the left insula and left basal ganglia. Suspected small chronic cortical infarct within the right occipital lobe (series 8, image 10). No evidence of intracranial mass. No chronic intracranial blood products. No extra-axial fluid collection. No midline shift. Vascular: Expected proximal arterial flow voids. SWI signal loss along the  course of the M1 and M2 left MCA likely reflecting stent artifact. Skull and upper cervical spine: No focal marrow lesion. Sinuses/Orbits: Visualized orbits show no acute finding. Trace bilateral ethmoid and maxillary sinus mucosal thickening. MRA HEAD FINDINGS Anterior circulation: The intracranial internal carotid arteries are patent. Mild stent artifact from a stent within the M1/M2 left MCA. The M1 left middle cerebral artery is now patent. Atherosclerotic irregularity of the M2 and more distal left MCA vessels. Most notably, there are sites of apparent high-grade stenosis within an inferior division proximal left M2 vessel. No proximal left M2 branch occlusion is identified. The M1 right MCA is patent. Redemonstrated moderate stenosis within the proximal right M 1 segment. No right M2 proximal branch occlusion or high-grade proximal stenosis. The anterior cerebral arteries are patent. No intracranial aneurysm is identified. Posterior circulation: The intracranial vertebral arteries are patent. The basilar artery is patent. The posterior cerebral arteries are patent. Redemonstrated atherosclerotic irregularity of the bilateral posterior cerebral arteries, most notably as follows. Multifocal severe stenoses within the P2 right PCA. Sites of up to moderate/severe stenosis within the P2 left PCA. Posterior communicating arteries are hypoplastic or absent bilaterally. Anatomic variants: As described IMPRESSION: MRI brain: 1. Scattered acute infarcts within the left MCA and MCA/PCA watershed territories affecting the cortical/subcortical left frontal, parietal and occipital lobes. Multiple acute infarcts are also present within the left insula and basal ganglia. No significant mass effect. No hemorrhagic conversion. 2. Suspected small chronic cortical infarct within the right occipital lobe. 3. Mild generalized parenchymal atrophy. MRA head: 1. Mild artifact arising from a stent within the M1/M2 left MCA. The M1 left  MCA is now patent. Atherosclerotic irregularity of the M2 and more distal left MCA vessels. Most notably, there is apparent high-grade stenosis within an inferior division proximal left M2 vessel. 2. Redemonstrated moderate stenosis within the proximal M1 right MCA. 3. Redemonstrated sites of severe stenosis within the P2 right PCA. 4. Redemonstrated sites of up to moderate/severe stenosis within the P2 left PCA. Electronically Signed   By: Kellie Simmering DO   On: 12/17/2020 16:01   CT CEREBRAL PERFUSION W CONTRAST  Result Date: 12/16/2020 CLINICAL DATA:  Right-sided deficits EXAM: CT ANGIOGRAPHY HEAD AND NECK CT PERFUSION BRAIN TECHNIQUE: Multidetector CT imaging of the head and neck was performed using the standard protocol during bolus administration of intravenous contrast. Multiplanar CT image reconstructions and MIPs were obtained to evaluate the vascular anatomy. Carotid stenosis measurements (when applicable) are obtained utilizing NASCET criteria, using the distal internal carotid diameter as the denominator. Multiphase CT imaging of the brain was performed following IV bolus  contrast injection. Subsequent parametric perfusion maps were calculated using RAPID software. CONTRAST:  164mL OMNIPAQUE IOHEXOL 350 MG/ML SOLN COMPARISON:  None. FINDINGS: CTA NECK FINDINGS SKELETON: There is no bony spinal canal stenosis. No lytic or blastic lesion. OTHER NECK: Normal pharynx, larynx and major salivary glands. No cervical lymphadenopathy. Unremarkable thyroid gland. UPPER CHEST: No pneumothorax or pleural effusion. No nodules or masses. AORTIC ARCH: There is no calcific atherosclerosis of the aortic arch. There is no aneurysm, dissection or hemodynamically significant stenosis of the visualized portion of the aorta. Conventional 3 vessel aortic branching pattern. The visualized proximal subclavian arteries are widely patent. RIGHT CAROTID SYSTEM: Normal without aneurysm, dissection or stenosis. LEFT CAROTID  SYSTEM: Normal without aneurysm, dissection or stenosis. VERTEBRAL ARTERIES: Left dominant configuration. Both origins are clearly patent. There is no dissection, occlusion or flow-limiting stenosis to the skull base (V1-V3 segments). CTA HEAD FINDINGS POSTERIOR CIRCULATION: --Vertebral arteries: Normal V4 segments. --Inferior cerebellar arteries: Normal. --Basilar artery: Normal. --Superior cerebellar arteries: Normal. --Posterior cerebral arteries (PCA): Multifocal moderate-to-severe stenosis of the right P2 segment. Normal left. ANTERIOR CIRCULATION: --Intracranial internal carotid arteries: Normal. --Anterior cerebral arteries (ACA): Normal. Both A1 segments are present. Patent anterior communicating artery (a-comm). --Middle cerebral arteries (MCA): Complete occlusion of the left MCA at the proximal M1 segment with limited collateralization. Right MCA is normal. VENOUS SINUSES: As permitted by contrast timing, patent. ANATOMIC VARIANTS: None Review of the MIP images confirms the above findings. CT Brain Perfusion Findings: ASPECTS: 9 CBF (<30%) Volume: 57mL Perfusion (Tmax>6.0s) volume: 175mL Mismatch Volume: 165mL, likely slightly overestimated due to small area of completed infarction seen on the noncontrast head CT. Infarction Location:Left MCA territory IMPRESSION: 1. 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 2. Large area of ischemic penumbra in the left MCA territory. 3. 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 Critical Value/emergent results were called by telephone at the time of interpretation on 12/16/2020 at 9:02 pm to provider Christ Kick , who verbally acknowledged these results. Aortic Atherosclerosis (ICD10-I70.0). Electronically Signed   By: Ulyses Jarred M.D.   On: 12/16/2020 21:08   DG CHEST PORT 1 VIEW  Result Date: 12/17/2020 CLINICAL DATA:  Intubation.  EXAM: PORTABLE CHEST 1 VIEW COMPARISON:  12/12/2020. FINDINGS: Endotracheal tube tip noted 4 cm above the carina. NG tube tip below left hemidiaphragm. Prior CABG. Heart size normal. Low lung volumes. Mild left base atelectasis/infiltrate cannot be excluded. Mild elevation left hemidiaphragm. No pleural effusion or pneumothorax. Prior cervical spine fusion. IMPRESSION: 1.  Endotracheal tube and NG tube in good anatomic position. 2.  Prior CABG.  Heart size normal. 3. Mild left base atelectasis/infiltrate cannot be excluded. Mild elevation left hemidiaphragm. Electronically Signed   By: Marcello Moores  Register   On: 12/17/2020 05:18   DG Chest Port 1 View  Result Date: 12/12/2020 CLINICAL DATA:  Patient complaining of headache and overall not feeling well since yesterday. States that he has not taken BP meds in one day. Reports generalized pain and believes he may have been running fever at home. EXAM: PORTABLE CHEST - 1 VIEW COMPARISON:  09/14/2020 FINDINGS: Lungs are clear.  Previous CABG. Heart size and mediastinal contours are within normal limits. Aortic Atherosclerosis (ICD10-170.0). No effusion.  No pneumothorax. Visualized bones unremarkable. IMPRESSION: No acute cardiopulmonary disease post CABG. Electronically Signed   By: Lucrezia Europe M.D.   On: 12/12/2020 11:48   EEG adult  Result Date: 12/17/2020 Hortense Ramal,  Cecil Cranker, MD     12/17/2020  1:00 PM Patient Name: Melvin Taylor MRN: [id number] Epilepsy Attending: Lora Havens Referring Physician/Provider: Dr. 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  EEG to evaluate for seizures. Level of alertness: Awake AEDs during EEG study: Lamotrigine, Depakote Technical aspects: This EEG study was done with scalp electrodes positioned according to the 10-20 International system of electrode placement. Electrical  activity was acquired at a sampling rate of 500Hz  and reviewed with a high frequency filter of 70Hz  and a low frequency filter of 1Hz . EEG data were recorded continuously and digitally stored. Description: The posterior dominant rhythm consists of 8 Hz activity of moderate voltage (25-35 uV) seen predominantly in posterior head regions, symmetric and reactive to eye opening and eye closing. EEG showed intermittent 3 to 6 Hz theta-delta slowing in left hemisphere.  Photic driving was not seen during photic stimulation. Hyperventilation was not performed.   ABNORMALITY - Intermittent slow, left hemisphere IMPRESSION: This study is suggestive of cortical dysfunction arising from left hemisphere, nonspecific etiology. No seizures or epileptiform discharges were seen throughout the recording. Lora Havens   ECHOCARDIOGRAM COMPLETE  Result Date: 12/17/2020    ECHOCARDIOGRAM REPORT   Patient Name:   Melvin Taylor Date of Exam: 12/17/2020 Medical Rec #:  [id number]         Height:       67.0 in Accession #:    [id number]        Weight:       157.0 lb Date of Birth:  29-Oct-1965          BSA:          1.825 m Patient Age:    21 years          BP:           96/70 mmHg Patient Gender: M                 HR:           60 bpm. Exam Location:  Inpatient Procedure: 2D Echo, Cardiac Doppler and Color Doppler Indications:    CVA  History:        Patient has prior history of Echocardiogram examinations, most                 recent 03/04/2019. Previous Myocardial Infarction and CAD, Prior                 CABG, Signs/Symptoms:Shortness of Breath; Risk Factors:Diabetes.                 03/28/2016 CABG                 03/07/2016 cath.  Sonographer:    Luisa Hart RDCS Referring Phys: [id number] Lorenza Chick  Sonographer Comments: Echo performed with patient supine and on artificial respirator. IMPRESSIONS  1. Left ventricular ejection fraction, by estimation, is 65 to 70%. The left ventricle has normal function. The left ventricle  has no regional wall motion abnormalities. There is moderate left ventricular hypertrophy. Left ventricular diastolic parameters are consistent with Grade I diastolic dysfunction (impaired relaxation).  2. Right ventricular systolic function is normal. The right ventricular size is normal. There is normal pulmonary artery systolic pressure. The estimated right ventricular systolic pressure is 05.3 mmHg.  3. The mitral valve is normal in  structure. Trivial mitral valve regurgitation. No evidence of mitral stenosis.  4. The aortic valve is normal in structure. Aortic valve regurgitation is not visualized. No aortic stenosis is present.  5. Aortic dilatation noted. There is borderline dilatation of the ascending aorta, measuring 38 mm.  6. The inferior vena cava is normal in size with greater than 50% respiratory variability, suggesting right atrial pressure of 3 mmHg. Conclusion(s)/Recommendation(s): No intracardiac source of embolism detected on this transthoracic study. A transesophageal echocardiogram is recommended to exclude cardiac source of embolism if clinically indicated. FINDINGS  Left Ventricle: Left ventricular ejection fraction, by estimation, is 65 to 70%. The left ventricle has normal function. The left ventricle has no regional wall motion abnormalities. The left ventricular internal cavity size was normal in size. There is  moderate left ventricular hypertrophy. Left ventricular diastolic parameters are consistent with Grade I diastolic dysfunction (impaired relaxation). Right Ventricle: The right ventricular size is normal. No increase in right ventricular wall thickness. Right ventricular systolic function is normal. There is normal pulmonary artery systolic pressure. The tricuspid regurgitant velocity is 2.14 m/s, and  with an assumed right atrial pressure of 3 mmHg, the estimated right ventricular systolic pressure is [id number] mmHg. Left Atrium: Left atrial size was normal in size. Right Atrium: Right  atrial size was normal in size. Pericardium: There is no evidence of pericardial effusion. Mitral Valve: The mitral valve is normal in structure. Trivial mitral valve regurgitation. No evidence of mitral valve stenosis. MV peak gradient, 8.0 mmHg. The mean mitral valve gradient is 3.0 mmHg. Tricuspid Valve: The tricuspid valve is normal in structure. Tricuspid valve regurgitation is mild . No evidence of tricuspid stenosis. Aortic Valve: The aortic valve is normal in structure. Aortic valve regurgitation is not visualized. No aortic stenosis is present. Aortic valve mean gradient measures 3.0 mmHg. Aortic valve peak gradient measures 6.2 mmHg. Aortic valve area, by VTI measures 3.55 cm. Pulmonic Valve: The pulmonic valve was thickened with good excursion. Pulmonic valve regurgitation is mild. No evidence of pulmonic stenosis. Aorta: Aortic dilatation noted. There is borderline dilatation of the ascending aorta, measuring 38 mm. Venous: The inferior vena cava is normal in size with greater than 50% respiratory variability, suggesting right atrial pressure of 3 mmHg. IAS/Shunts: There is redundancy of the interatrial septum. No atrial level shunt detected by color flow Doppler.  LEFT VENTRICLE PLAX 2D LVIDd:         3.50 cm  Diastology LVIDs:         1.70 cm  LV e' medial:    7.40 cm/s LV PW:         1.50 cm  LV E/e' medial:  11.9 LV IVS:        1.50 cm  LV e' lateral:   9.36 cm/s LVOT diam:     2.20 cm  LV E/e' lateral: 9.4 LV SV:         95 LV SV Index:   52 LVOT Area:     3.80 cm  RIGHT VENTRICLE RV Basal diam:  4.30 cm RV Mid diam:    2.60 cm LEFT ATRIUM             Index       RIGHT ATRIUM           Index LA diam:        3.20 cm 1.75 cm/m  RA Area:     11.60 cm LA Vol (A2C):   24.9 ml 13.65 ml/m RA Volume:  26.10 ml  14.30 ml/m LA Vol (A4C):   33.7 ml 18.47 ml/m LA Biplane Vol: 30.6 ml 16.77 ml/m  AORTIC VALVE                   PULMONIC VALVE AV Area (Vmax):    3.25 cm    PV Vmax:       0.76 m/s AV Area  (Vmean):   3.28 cm    PV Vmean:      50.700 cm/s AV Area (VTI):     3.55 cm    PV VTI:        0.159 m AV Vmax:           125.00 cm/s PV Peak grad:  2.3 mmHg AV Vmean:          83.600 cm/s PV Mean grad:  1.0 mmHg AV VTI:            0.269 m AV Peak Grad:      6.2 mmHg AV Mean Grad:      3.0 mmHg LVOT Vmax:         107.00 cm/s LVOT Vmean:        72.200 cm/s LVOT VTI:          0.251 m LVOT/AV VTI ratio: 0.93  AORTA Ao Root diam: 3.70 cm Ao Asc diam:  3.80 cm MITRAL VALVE                TRICUSPID VALVE MV Area (PHT): 2.76 cm     TR Peak grad:   18.3 mmHg MV Area VTI:   2.07 cm     TR Vmax:        214.00 cm/s MV Peak grad:  8.0 mmHg MV Mean grad:  3.0 mmHg     SHUNTS MV Vmax:       1.41 m/s     Systemic VTI:  0.25 m MV Vmean:      80.8 cm/s    Systemic Diam: 2.20 cm MV Decel Time: 275 msec MV E velocity: 88.10 cm/s MV A velocity: 109.00 cm/s MV E/A ratio:  0.81 Cherlynn Kaiser MD Electronically signed by Cherlynn Kaiser MD Signature Date/Time: 12/17/2020/1:53:18 PM    Final    CT HEAD CODE STROKE WO CONTRAST  Result Date: 12/16/2020 CLINICAL DATA:  Code stroke.  Right-sided deficits EXAM: CT HEAD WITHOUT CONTRAST TECHNIQUE: Contiguous axial images were obtained from the base of the skull through the vertex without intravenous contrast. COMPARISON:  12/12/2020 FINDINGS: Brain: There is no mass, hemorrhage or extra-axial collection. The size and configuration of the ventricles and extra-axial CSF spaces are normal. Area of hypoattenuation in the inferior left frontal lobe, likely early subacute infarct. Small vessel infarcts in the left corona radiata are unchanged. Vascular: No abnormal hyperdensity of the major intracranial arteries or dural venous sinuses. No intracranial atherosclerosis. Skull: The visualized skull base, calvarium and extracranial soft tissues are normal. Sinuses/Orbits: No fluid levels or advanced mucosal thickening of the visualized paranasal sinuses. No mastoid or middle ear effusion. The  orbits are normal. ASPECTS [facility] Stroke Program Early CT Score) - Ganglionic level infarction (caudate, lentiform nuclei, internal capsule, insula, M1-M3 cortex): 6 - Supraganglionic infarction (M4-M6 cortex): 3 Total score (0-10 with 10 being normal): 9 IMPRESSION: 1. No acute intracranial hemorrhage. 2. Suspected early subacute infarct of the inferior left frontal lobe. 3. ASPECTS is 9. These results were called by telephone at the time of interpretation on 12/16/2020 at 8:25 pm to provider ELLIOTT  JQGBE , who verbally acknowledged these results. Electronically Signed   By: Deatra Robinson M.D.   On: 12/16/2020 20:26   CT ANGIO HEAD CODE STROKE  Result Date: 12/16/2020 CLINICAL DATA:  Right-sided deficits EXAM: CT ANGIOGRAPHY HEAD AND NECK CT PERFUSION BRAIN TECHNIQUE: Multidetector CT imaging of the head and neck was performed using the standard protocol during bolus administration of intravenous contrast. Multiplanar CT image reconstructions and MIPs were obtained to evaluate the vascular anatomy. Carotid stenosis measurements (when applicable) are obtained utilizing NASCET criteria, using the distal internal carotid diameter as the denominator. Multiphase CT imaging of the brain was performed following IV bolus contrast injection. Subsequent parametric perfusion maps were calculated using RAPID software. CONTRAST:  OMNIPAQUE IOHEXOL 350 MG/ML SOLN COMPARISON:  None. FINDINGS: CTA NECK FINDINGS SKELETON: There is no bony spinal canal stenosis. No lytic or blastic lesion. OTHER NECK: Normal pharynx, larynx and major salivary glands. No cervical lymphadenopathy. Unremarkable thyroid gland. UPPER CHEST: No pneumothorax or pleural effusion. No nodules or masses. AORTIC ARCH: There is no calcific atherosclerosis of the aortic arch. There is no aneurysm, dissection or hemodynamically significant stenosis of the visualized portion of the aorta. Conventional 3 vessel aortic branching pattern. The visualized  proximal subclavian arteries are widely patent. RIGHT CAROTID SYSTEM: Normal without aneurysm, dissection or stenosis. LEFT CAROTID SYSTEM: Normal without aneurysm, dissection or stenosis. VERTEBRAL ARTERIES: Left dominant configuration. Both origins are clearly patent. There is no dissection, occlusion or flow-limiting stenosis to the skull base (V1-V3 segments). CTA HEAD FINDINGS POSTERIOR CIRCULATION: --Vertebral arteries: Normal V4 segments. --Inferior cerebellar arteries: Normal. --Basilar artery: Normal. --Superior cerebellar arteries: Normal. --Posterior cerebral arteries (PCA): Multifocal moderate-to-severe stenosis of the right P2 segment. Normal left. ANTERIOR CIRCULATION: --Intracranial internal carotid arteries: Normal. --Anterior cerebral arteries (ACA): Normal. Both A1 segments are present. Patent anterior communicating artery (a-comm). --Middle cerebral arteries (MCA): Complete occlusion of the left MCA at the proximal M1 segment with limited collateralization. Right MCA is normal. VENOUS SINUSES: As permitted by contrast timing, patent. ANATOMIC VARIANTS: None Review of the MIP images confirms the above findings. CT Brain Perfusion Findings: ASPECTS: 9 CBF (<30%) Volume: 86mL Perfusion (Tmax>6.0s) volume: Mismatch Volume: , likely slightly overestimated due to small area of completed infarction seen on the noncontrast head CT. Infarction Location:Left MCA territory IMPRESSION: 1. 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 2. Large area of ischemic penumbra in the left MCA territory. 3. 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 Critical Value/emergent results were called by telephone at the time of interpretation on 12/16/2020 at 9:02 pm to provider Gray Bernhardt , who verbally acknowledged these results. Aortic Atherosclerosis (ICD10-I70.0). Electronically Signed    By: Deatra Robinson M.D.   On: 12/16/2020 21:08   CT ANGIO NECK CODE STROKE  Result Date: 12/16/2020 CLINICAL DATA:  Right-sided deficits EXAM: CT ANGIOGRAPHY HEAD AND NECK CT PERFUSION BRAIN TECHNIQUE: Multidetector CT imaging of the head and neck was performed using the standard protocol during bolus administration of intravenous contrast. Multiplanar CT image reconstructions and MIPs were obtained to evaluate the vascular anatomy. Carotid stenosis measurements (when applicable) are obtained utilizing NASCET criteria, using the distal internal carotid diameter as the denominator. Multiphase CT imaging of the brain was performed following IV bolus contrast injection. Subsequent parametric perfusion maps were calculated using RAPID software. CONTRAST:  OMNIPAQUE IOHEXOL 350 MG/ML SOLN COMPARISON:  None. FINDINGS: CTA NECK FINDINGS SKELETON: There  is no bony spinal canal stenosis. No lytic or blastic lesion. OTHER NECK: Normal pharynx, larynx and major salivary glands. No cervical lymphadenopathy. Unremarkable thyroid gland. UPPER CHEST: No pneumothorax or pleural effusion. No nodules or masses. AORTIC ARCH: There is no calcific atherosclerosis of the aortic arch. There is no aneurysm, dissection or hemodynamically significant stenosis of the visualized portion of the aorta. Conventional 3 vessel aortic branching pattern. The visualized proximal subclavian arteries are widely patent. RIGHT CAROTID SYSTEM: Normal without aneurysm, dissection or stenosis. LEFT CAROTID SYSTEM: Normal without aneurysm, dissection or stenosis. VERTEBRAL ARTERIES: Left dominant configuration. Both origins are clearly patent. There is no dissection, occlusion or flow-limiting stenosis to the skull base (V1-V3 segments). CTA HEAD FINDINGS POSTERIOR CIRCULATION: --Vertebral arteries: Normal V4 segments. --Inferior cerebellar arteries: Normal. --Basilar artery: Normal. --Superior cerebellar arteries: Normal. --Posterior cerebral  arteries (PCA): Multifocal moderate-to-severe stenosis of the right P2 segment. Normal left. ANTERIOR CIRCULATION: --Intracranial internal carotid arteries: Normal. --Anterior cerebral arteries (ACA): Normal. Both A1 segments are present. Patent anterior communicating artery (a-comm). --Middle cerebral arteries (MCA): Complete occlusion of the left MCA at the proximal M1 segment with limited collateralization. Right MCA is normal. VENOUS SINUSES: As permitted by contrast timing, patent. ANATOMIC VARIANTS: None Review of the MIP images confirms the above findings. CT Brain Perfusion Findings: ASPECTS: 9 CBF (<30%) Volume: 63mL Perfusion (Tmax>6.0s) volume: 122mL Mismatch Volume: 168mL, likely slightly overestimated due to small area of completed infarction seen on the noncontrast head CT. Infarction Location:Left MCA territory IMPRESSION: 1. 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 2. Large area of ischemic penumbra in the left MCA territory. 3. 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 Critical Value/emergent results were called by telephone at the time of interpretation on 12/16/2020 at 9:02 pm to provider Christ Kick , who verbally acknowledged these results. Aortic Atherosclerosis (ICD10-I70.0). Electronically Signed   By: Ulyses Jarred M.D.   On: 12/16/2020 21:08    PHYSICAL EXAM  Temp:  [97 F (36.1 C)-98.3 F (36.8 C)] 98.3 F (36.8 C) (05/21 1142) Pulse Rate:  [54-73] 55 (05/21 1142) Resp:  [16-19] 16 (05/21 1142) BP: (143-176)/(71-88) 176/88 (05/21 1142) SpO2:  [100 %] 100 % (05/21 1142)  General - Well nourished, well developed, not in acute distress, feeding himself breakfast really well.  Ophthalmologic - fundi not visualized due to noncooperation.  Cardiovascular - Regular rhythm and bradycardia.  Neuro - awake, alert, eyes open, oriented to people, age  and self, but not to time.  Slightly nonfluent speech with occasional low paraphasic errors with mild perseveration, mild dysarthria, following all simple commands. Able to repeat simple sentences but complex sentences, able to name 3/3. No gaze palsy, tracking bilaterally, visual field full, PERRL. No facial droop. Tongue midline. LUE no drift, RUE pronator drift, but finger grip 4/5 bilaterally. BLE 3/5 proximal, distally left 5/5 and right 4/5. Sensation symmetrical bilaterally, b/l FTN intact grossly, gait not tested.   ASSESSMENT/PLAN Melvin Taylor is a 55 y.o. male with history of seizure on Depakote and Lamictal, CAD status post CABG, diabetes, hypertension, hyperlipidemia, OSA, substance abuse admitted for seizure, right-sided weakness and slurred speech. No tPA given due to outside window.    Stroke:  left MCA infarct due to left M1 occlusion s/p IR with stenting, secondary to ICAD  CT head left MCA anterior hypoattenuation, concerning for infarct  CTA head and neck left M1 occlusion  IR  with TICI3 and left MCA stenting.   CT repeat left MCA infarct with trace SAH  MRI scattered left MCA infarcts. small chronic cortical infarct within the right occipital lobe.  MRA left M1 patent, high-grade stenosis inferior left M2, moderate stenosis right M1, severe stenosis, bilateral P2.  2D Echo  EF 65-70%  LDL 116  HgbA1c 9.7  Lovenox for VTE prophylaxis  clopidogrel 75 mg daily prior to admission, now on Brilinta (ticagrelor) 90 mg bid post stent, patient has allergy to aspirin.  Patient counseled to be compliant with his antithrombotic medications   Possible pseudoanerysm at groin site: check ultrasound today, Hgb up to 10.5 and HCT 30 on 12 noon check.   Ongoing aggressive stroke risk factor management  Therapy recommendations: Pending, pt lives alone  Disposition: Pending  Seizure  History of seizure on Depakote and Lamictal  Recent breakthrough seizure  On  Depakote 1500 twice daily PTA -> 1000 bid  lamictal 50 twice daily   EEG no seizure  Depakote level 39->150->52, continue depakote 1000mg  bid  depakote level daily  Diabetes Hypoglycemia 5/20  HgbA1c 9.7 goal < 7.0  Uncontrolled  Hypoglycemia x one on 5/20  CBG monitoring stable past 24 hours   SSI  DM coordinator consulted, appreciate recs  DM education and close PCP follow up  Hypertension . Stable . BP goal 120-140 within 24h of IR  Long term BP goal 130-150 given intracranial stenosis  Hyperlipidemia  Home meds: Lipitor 40  LDL 116, goal < 70  Now on Lipitor 80  Continue statin at discharge  Tobacco abuse  Current smoker  Smoking cessation counseling provided  Pt is willing to quit  Urine retention  Foley placed 5/20  Flomax on board  Consider voiding trial around 5/25  Other Stroke Risk Factors  UDS positive THC, cessation education provided  ETOH use  Hx stroke/TIA - old infarct on MRI  Coronary artery disease status post CABG  Obstructive sleep apnea, on CPAP at home  Other Active Problems  AKI on CKD IIa, Cre 1.33->2.15->2.20->1.75->1.74, continue IV fluid  I have personally obtained history,examined this patient, reviewed notes, independently viewed imaging studies, participated in medical decision making and plan of care.ROS completed by me personally and pertinent positives fully documented  I have made any additions or clarifications directly to the above note. Agree with note above.  Continue valproic acid and the current dose for seizures.  Check right groin ultrasound to look for pseudoaneurysm due to falling hematocrit.  Repeat hematocrit at 12 noon today.  Long discussion with patient and answered questions.  Greater than 50% time during this 35-minute visit was spent in counseling and coordination of care and discussion with care team and answering questions.  Antony Contras, MD Medical Director [facility] Pager: [phone] 12/19/2020 2:45 PM   To contact Stroke Continuity provider, please refer to [email or web address] After hours, contact General Neurology

## 2020-12-19 NOTE — TOC Initial Note (Signed)
Transition of Care [facility]) - Initial/Assessment Note    Patient Details  Name: Melvin Taylor MRN: [id number] Date of Birth: August 07, 1965  Transition of Care [facility]) CM/SW Contact:    Carles Collet, RN Phone Number: 12/19/2020, 3:10 PM  Clinical Narrative:         PTA pt lives alone and has a PCA 6 days/wk, 4 hrs/day who assists with IADL tasks, including cooking, cleaning and medication management. Discussed discharge options with him. Informed him that if home health agency can be established, and may not be due to local agencies with decreased staffing and his payor source, Medicaid will only cover 1-3 home visits initially. Discussed that he would make a much recovery if he wen to SNF or CIR. Encouraged him to pursue CIR, he declined. Attempted to call his sister three times, no answer or call back.            perrigram,lisa Sister   [phone]     Expected Discharge Plan: Home/Self Care Barriers to Discharge: Continued Medical Work up   Patient Goals and CMS Choice Patient states their goals for this hospitalization and ongoing recovery are:: to go home      Expected Discharge Plan and Services Expected Discharge Plan: Home/Self Care                                              Prior Living Arrangements/Services   Lives with:: Self              Current home services: Homehealth aide    Activities of Daily Living Home Assistive Devices/Equipment: Blood pressure cuff,Eyeglasses,CBG Meter,Shower chair with back,Cane (specify quad or straight) ADL Screening (condition at time of admission) Patient's cognitive ability adequate to safely complete daily activities?: No Is the patient deaf or have difficulty hearing?: Yes Does the patient have difficulty seeing, even when wearing glasses/contacts?: Yes Does the patient have difficulty concentrating, remembering, or making decisions?: Yes Patient able to express need for assistance with ADLs?: No Does the  patient have difficulty dressing or bathing?: Yes Independently performs ADLs?: No Communication: Needs assistance Is this a change from baseline?: Change from baseline, expected to last >3 days Dressing (OT): Dependent Is this a change from baseline?: Change from baseline, expected to last >3 days Grooming: Dependent Is this a change from baseline?: Change from baseline, expected to last >3 days Feeding: Dependent,Needs assistance Is this a change from baseline?: Change from baseline, expected to last >3 days Bathing: Dependent Is this a change from baseline?: Change from baseline, expected to last >3 days Toileting: Needs assistance Is this a change from baseline?: Change from baseline, expected to last >3days In/Out Bed: Dependent Is this a change from baseline?: Change from baseline, expected to last >3 days Walks in Home: Dependent Is this a change from baseline?: Change from baseline, expected to last >3 days Does the patient have difficulty walking or climbing stairs?: Yes Weakness of Legs: Right Weakness of Arms/Hands: Right  Permission Sought/Granted                  Emotional Assessment              Admission diagnosis:  Renal insufficiency [N28.9] Acute ischemic left MCA stroke ([place]) [I63.512] Acute ischemic stroke [facility]) [I63.9] Middle cerebral artery embolism, left [I66.02] Patient Active Problem List   Diagnosis Date Noted  .  Malnutrition of moderate degree 12/18/2020  . Middle cerebral artery embolism, 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  . Encounter for intubation   . Renal insufficiency   . Malignant hypertension   . Acute ischemic left MCA stroke ([place]) 12/16/2020  . Current smoker 06/21/2019  . Seizure-like activity ([place]) 05/06/2019  . Diabetes mellitus ([place]) 04/11/2019  . Anemia, chronic disease 07/04/2018  . Other specified diseases of the digestive system 10/30/2017  . CAD (coronary artery disease) 03/28/2016  . Chest pain 03/21/2016  . Mixed hyperlipidemia  03/11/2016  . Ischemic chest pain ([place])   . NSTEMI (non-ST elevated myocardial infarction) ([place])   . Coronary atherosclerosis of native coronary artery 03/03/2016  . Cervical spinal stenosis 12/14/2015  . Loss of weight 08/25/2015  . Essential hypertension, benign 07/23/2015  . Personal history of noncompliance with medical treatment, presenting hazards to health 07/23/2015  . Abdominal pain 06/08/2015  . Constipation 06/08/2015  . History of colonic polyps   . Diverticulosis of colon without hemorrhage   . Mucosal abnormality of stomach   . Encounter for screening colonoscopy 04/16/2015  . Dysphagia 04/16/2015  . Partial symptomatic epilepsy with complex partial seizures, intractable, without status epilepticus ([place]) 11/10/2014  . Bipolar disorder, unspecified ([place]) 10/15/2014  . Schizophrenia ([place]) 10/15/2014  . DM type 2 causing vascular disease ([place]) 06/29/2014  . Musculoskeletal pain 06/29/2014  . Hyperglycemia without ketosis 09/07/2013  . 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 Other internal derangements of unspecified knee 01/13/2011  . Knee pain 12/28/2010  . Medial meniscus, posterior horn derangement 12/28/2010   PCP:  [name], FNP Pharmacy:   [place], [address] [place] [address] Phone: [phone] Fax: [place], [place] [address] [place] [address] Phone: [phone] Fax: [phone]     Social Determinants of Health (SDOH) Interventions    Readmission Risk Interventions No flowsheet data found.

## 2020-12-20 DIAGNOSIS — I63512 Cerebral infarction due to unspecified occlusion or stenosis of left middle cerebral artery: Secondary | ICD-10-CM | POA: Diagnosis not present

## 2020-12-20 LAB — CBC WITH DIFFERENTIAL/PLATELET
Abs Immature Granulocytes: 0.02 10*3/uL (ref 0.00–0.07)
Basophils Absolute: 0 10*3/uL (ref 0.0–0.1)
Basophils Relative: 0 %
Eosinophils Absolute: 0 10*3/uL (ref 0.0–0.5)
Eosinophils Relative: 0 %
HCT: 28.1 % — ABNORMAL LOW (ref 39.0–52.0)
Hemoglobin: 10 g/dL — ABNORMAL LOW (ref 13.0–17.0)
Immature Granulocytes: 0 %
Lymphocytes Relative: 10 %
Lymphs Abs: 0.6 10*3/uL — ABNORMAL LOW (ref 0.7–4.0)
MCH: 31.3 pg (ref 26.0–34.0)
MCHC: 35.6 g/dL (ref 30.0–36.0)
MCV: 88.1 fL (ref 80.0–100.0)
Monocytes Absolute: 0.5 10*3/uL (ref 0.1–1.0)
Monocytes Relative: 8 %
Neutro Abs: 4.8 10*3/uL (ref 1.7–7.7)
Neutrophils Relative %: 82 %
Platelets: DECREASED 10*3/uL (ref 150–400)
RBC: 3.19 MIL/uL — ABNORMAL LOW (ref 4.22–5.81)
RDW: 12.1 % (ref 11.5–15.5)
WBC: 5.9 10*3/uL (ref 4.0–10.5)
nRBC: 0 % (ref 0.0–0.2)

## 2020-12-20 LAB — GLUCOSE, CAPILLARY
Glucose-Capillary: 115 mg/dL — ABNORMAL HIGH (ref 70–99)
Glucose-Capillary: 147 mg/dL — ABNORMAL HIGH (ref 70–99)
Glucose-Capillary: 198 mg/dL — ABNORMAL HIGH (ref 70–99)
Glucose-Capillary: 182 mg/dL — ABNORMAL HIGH (ref 70–99)
Glucose-Capillary: 123 mg/dL — ABNORMAL HIGH (ref 70–99)

## 2020-12-20 LAB — VALPROIC ACID LEVEL: Valproic Acid Lvl: 88 ug/mL (ref 50.0–100.0)

## 2020-12-20 LAB — BASIC METABOLIC PANEL
Anion gap: 7 (ref 5–15)
BUN: 20 mg/dL (ref 6–20)
CO2: 22 mmol/L (ref 22–32)
Calcium: 8.4 mg/dL — ABNORMAL LOW (ref 8.9–10.3)
Chloride: 108 mmol/L (ref 98–111)
Creatinine, Ser: 1.61 mg/dL — ABNORMAL HIGH (ref 0.61–1.24)
GFR, Estimated: 50 mL/min — ABNORMAL LOW (ref >60)
Glucose, Bld: 96 mg/dL (ref 70–99)
Potassium: 3.7 mmol/L (ref 3.5–5.1)
Sodium: 137 mmol/L (ref 135–145)

## 2020-12-20 MED ORDER — HYDROCHLOROTHIAZIDE 25 MG PO TABS
25.0000 mg | ORAL_TABLET | Freq: Every day | ORAL | Status: DC
  Administered 2020-12-20 – 2020-12-21 (×2): 25 mg via ORAL
  Filled 2020-12-20 (×2): qty 1

## 2020-12-20 NOTE — Progress Notes (Signed)
Inpatient Diabetes Program Recommendations  AACE/ADA: New Consensus Statement on Inpatient Glycemic Control (2015)  Target Ranges:  Prepandial:   less than 140 mg/dL      Peak postprandial:   less than 180 mg/dL (1-2 hours)      Critically ill patients:  140 - 180 mg/dL   Results for YISRAEL, OBRYAN (MRN [id number]) as of 12/20/2020 12:12  Ref. Range 12/19/2020 07:56 12/19/2020 11:36 12/19/2020 17:04 12/19/2020 20:00 12/19/2020 22:41  Glucose-Capillary Latest Ref Range: 70 - 99 mg/dL 150 (H)  1 unit NOVOLOG  216 (H)  3 units NOVOLOG  231 (H)  3 units NOVOLOG  227 (H) 202 (H)  3 units NOVOLOG    Results for JASMON, GRAFFAM (MRN [id number]) as of 12/20/2020 12:12  Ref. Range 12/20/2020 06:47 12/20/2020 11:45 12/20/2020 12:06  Glucose-Capillary Latest Ref Range: 70 - 99 mg/dL 115 (H) 147 (H) 198 (H)    Home DM Meds: Lantus 40 units QHS       Novolog 4 units QID  Current Orders: Novolog Sensitive Correction Scale/ SSI (0-9 units) TID AC + HS    MD- Note patient getting PO diet + Ensure Enlive PO Supps  Please consider adding Novolog Meal Coverage to help with afternoon CBG elevations:  Novolog 4 units TID with meals  Hold if pt eats <50% of meal, Hold if pt NPO     --Will follow patient during hospitalization--  Wyn Quaker RN, MSN, CDE Diabetes Coordinator Inpatient Glycemic Control Team Team Pager: [phone] (8a-5p)

## 2020-12-20 NOTE — Progress Notes (Signed)
STROKE TEAM PROGRESS NOTE   SUBJECTIVE (INTERVAL HISTORY) No acute events overnight.  Blood pressure remains slightly high and will add hydrochlorothiazide Long discussion with him regarding CIR recommendations and need for therapy for his best outcome. He agrees to seek CIR admission. Sister, Misty Stanley, called and is in agreement with this plan of care. She was updated on his current status and treatment plan.   OBJECTIVE Temp:  [97.6 F (36.4 C)-98.5 F (36.9 C)] 98.2 F (36.8 C) (05/22 0800) Pulse Rate:  [56-89] 56 (05/22 0800) Cardiac Rhythm: Normal sinus rhythm (05/22 0806) Resp:  [14-18] 16 (05/22 0800) BP: (117-165)/(54-82) 157/62 (05/22 0800) SpO2:  [99 %-100 %] 100 % (05/22 0800)  Recent Labs  Lab 12/19/20 1136 12/19/20 1704 12/19/20 2000 12/19/20 2241 12/20/20 0647  GLUCAP 216* 231* 227* 202* 115*   Recent Labs  Lab 12/15/20 1341 12/16/20 2028 12/16/20 2038 12/17/20 0237 12/17/20 0254 12/18/20 0429 12/19/20 0047 12/20/20 0124  NA  --  135 139 139 136 138 136 137  K  --  3.7 3.7 3.5 3.7 3.7 4.2 3.7  CL  --  102 102  --  111 114* 111 108  CO2  --  27  --   --  17* 19* 21* 22  GLUCOSE  --  175* 159*  --  235* 86 160* 96  BUN  --  22* 21*  --  18 19 23* 20  CREATININE  --  2.15* 2.20*  --  1.75* 1.74* 1.71* 1.61*  CALCIUM  --  7.7*  --   --  7.2* 7.8* 7.9* 8.4*  MG 1.7  --   --   --  1.6* 2.8*  --   --   PHOS  --   --   --   --   --  4.3  --   --    Recent Labs  Lab 12/15/20 0929 12/16/20 2028  AST 20 16  ALT 13 12  ALKPHOS 41 39  BILITOT 0.7 0.5  PROT 5.5* 5.1*  ALBUMIN 2.8* 2.8*   Recent Labs  Lab 12/16/20 2028 12/16/20 2038 12/17/20 0254 12/18/20 0429 12/19/20 0047 12/19/20 1157 12/20/20 0124  WBC 4.2  --  5.7 6.2 4.6 4.5 5.9  NEUTROABS 1.5*  --  4.5 4.4 2.9  --  4.8  HGB 12.0*   < > 11.3* 11.1* 9.3* 10.5* 10.0*  HCT 34.5*   < > 32.8* 31.5* 26.5* 30.3* 28.1*  MCV 91.0  --  90.1 89.7 89.2 90.2 88.1  PLT 183  --  173 158 126* 129* PLATELET  CLUMPS NOTED ON SMEAR, COUNT APPEARS DECREASED   < > = values in this interval not displayed.   No results for input(s): CKTOTAL, CKMB, CKMBINDEX, TROPONINI in the last 168 hours. No results for input(s): LABPROT, INR in the last 72 hours. No results for input(s): COLORURINE, LABSPEC, PHURINE, GLUCOSEU, HGBUR, BILIRUBINUR, KETONESUR, PROTEINUR, UROBILINOGEN, NITRITE, LEUKOCYTESUR in the last 72 hours.  Invalid input(s): APPERANCEUR     Component Value Date/Time   CHOL 224 (H) 12/17/2020 0254   CHOL 286 (H) 02/12/2020 1334   TRIG 146 12/17/2020 0254   HDL 79 12/17/2020 0254   HDL 118 02/12/2020 1334   CHOLHDL 2.8 12/17/2020 0254   VLDL 29 12/17/2020 0254   LDLCALC 116 (H) 12/17/2020 0254   LDLCALC 145 (H) 02/12/2020 1334   Lab Results  Component Value Date   HGBA1C 9.7 (H) 12/17/2020      Component Value Date/Time  LABOPIA NONE DETECTED 12/17/2020 0254   COCAINSCRNUR NONE DETECTED 12/17/2020 0254   LABBENZ NONE DETECTED 12/17/2020 0254   AMPHETMU NONE DETECTED 12/17/2020 0254   THCU POSITIVE (A) 12/17/2020 0254   LABBARB NONE DETECTED 12/17/2020 0254    Recent Labs  Lab 12/15/20 0930 12/16/20 2028  ETH <10 <10    I have personally reviewed the radiological images below and agree with the radiology interpretations.  DG Sacrum/Coccyx  Result Date: 11/30/2020 CLINICAL DATA:  Recent fall with sacral pain, initial encounter EXAM: SACRUM AND COCCYX - 2+ VIEW COMPARISON:  None. FINDINGS: Sacrum is well visualized. Sacral ala are unremarkable. No acute fracture is seen. IMPRESSION: No acute abnormality noted. Electronically Signed   By: Inez Catalina M.D.   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 EXAM: CT HEAD WITHOUT CONTRAST TECHNIQUE: Contiguous axial images were obtained from the base of the skull through the vertex without  intravenous contrast. COMPARISON:  CTA, CTP and CT head yesterday. FINDINGS: Brain: Pseudo normalization of the left inferior frontal gyrus which was hypodense at presentation yesterday (series 3, image 19). Trace subarachnoid hemorrhage in the left central sulcus on series 3, image 24. Trace contrast staining suspected in the left inferior basal ganglia series 3, image 15. And questionable additional mild gyral contrast staining in the anterior left MCA territory. Scattered areas of subcortical and central white matter hypodensity in the left frontal lobe are otherwise stable to decreased in conspicuity (series 3, image 22). But there is no confluent cytotoxic edema evident, and no intracranial mass effect or midline shift. No ventriculomegaly. No other intracranial hemorrhage identified. Vascular: Left MCA M1 region vascular stent is new since presentation. Skull: No acute osseous abnormality identified. Sinuses/Orbits: Trace fluid and mucosal thickening in the paranasal sinuses now. Mastoids and tympanic cavities remain clear. Other: Intubated. Fluid in the nasopharynx. Negative orbit and scalp soft tissues. IMPRESSION: 1. Some pseudo-normalization of the left inferior frontal gyrus hypodensity since the presentation CT yesterday which might be related to contrast staining. But there is otherwise little to no cytotoxic edema evident in the Left MCA territory. 2. Trace subarachnoid hemorrhage in the left hemisphere. No malignant hemorrhagic transformation or intracranial mass effect. 3. New left MCA M1 stent. Electronically Signed   By: Genevie Ann M.D.   On: 12/17/2020 06:15   CT Head Wo Contrast  Result Date: 12/12/2020 CLINICAL DATA:  55 year old with acute onset of headache and possible fever that began yesterday. Current history of hypertension. EXAM: CT HEAD WITHOUT CONTRAST TECHNIQUE: Contiguous axial images were obtained from the base of the skull through the vertex without intravenous contrast.  COMPARISON:  06/09/2018 and earlier. FINDINGS: Brain: Ventricular system normal in size and appearance for age. No mass lesion. No midline shift. No acute hemorrhage or hematoma. No extra-axial fluid collections. No evidence of acute infarction. No interval change. Vascular: Mild BILATERAL carotid siphon atherosclerosis. No hyperdense vessel. Skull: No skull fracture or other focal osseous abnormality involving the skull. Sinuses/Orbits: Visualized paranasal sinuses, bilateral mastoid air cells and bilateral middle ear cavities well-aerated. Visualized orbits and globes normal in appearance. Other: None. IMPRESSION: No acute intracranial abnormality.  Stable examination. Electronically Signed   By: Evangeline Dakin M.D.   On: 12/12/2020 11:57   MR ANGIO HEAD WO CONTRAST  Result Date: 12/17/2020 CLINICAL DATA:  Stroke, follow-up. EXAM: MRI HEAD WITHOUT CONTRAST MRA HEAD  WITHOUT CONTRAST TECHNIQUE: Multiplanar, multi-echo pulse sequences of the brain and surrounding structures were acquired without intravenous contrast. Angiographic images of the Circle of Willis were acquired using MRA technique without intravenous contrast. COMPARISON: No pertinent prior exam. COMPARISON:  Noncontrast head CT 12/16/2020. CT angiogram head/neck and CT perfusion 12/16/2020. FINDINGS: MRI HEAD FINDINGS Brain: Mild intermittent motion degradation. Mild cerebral and cerebellar atrophy. Scattered acute infarcts within the left MCA and MCA/PCA watershed territories affecting the cortical/subcortical left frontal, parietal and occipital lobes. Multiple acute infarcts are also present within the left insula and left basal ganglia. Suspected small chronic cortical infarct within the right occipital lobe (series 8, image 10). No evidence of intracranial mass. No chronic intracranial blood products. No extra-axial fluid collection. No midline shift. Vascular: Expected proximal arterial flow voids. SWI signal loss along the course of the M1  and M2 left MCA likely reflecting stent artifact. Skull and upper cervical spine: No focal marrow lesion. Sinuses/Orbits: Visualized orbits show no acute finding. Trace bilateral ethmoid and maxillary sinus mucosal thickening. MRA HEAD FINDINGS Anterior circulation: The intracranial internal carotid arteries are patent. Mild stent artifact from a stent within the M1/M2 left MCA. The M1 left middle cerebral artery is now patent. Atherosclerotic irregularity of the M2 and more distal left MCA vessels. Most notably, there are sites of apparent high-grade stenosis within an inferior division proximal left M2 vessel. No proximal left M2 branch occlusion is identified. The M1 right MCA is patent. Redemonstrated moderate stenosis within the proximal right M 1 segment. No right M2 proximal branch occlusion or high-grade proximal stenosis. The anterior cerebral arteries are patent. No intracranial aneurysm is identified. Posterior circulation: The intracranial vertebral arteries are patent. The basilar artery is patent. The posterior cerebral arteries are patent. Redemonstrated atherosclerotic irregularity of the bilateral posterior cerebral arteries, most notably as follows. Multifocal severe stenoses within the P2 right PCA. Sites of up to moderate/severe stenosis within the P2 left PCA. Posterior communicating arteries are hypoplastic or absent bilaterally. Anatomic variants: As described IMPRESSION: MRI brain: 1. Scattered acute infarcts within the left MCA and MCA/PCA watershed territories affecting the cortical/subcortical left frontal, parietal and occipital lobes. Multiple acute infarcts are also present within the left insula and basal ganglia. No significant mass effect. No hemorrhagic conversion. 2. Suspected small chronic cortical infarct within the right occipital lobe. 3. Mild generalized parenchymal atrophy. MRA head: 1. Mild artifact arising from a stent within the M1/M2 left MCA. The M1 left MCA is now  patent. Atherosclerotic irregularity of the M2 and more distal left MCA vessels. Most notably, there is apparent high-grade stenosis within an inferior division proximal left M2 vessel. 2. Redemonstrated moderate stenosis within the proximal M1 right MCA. 3. Redemonstrated sites of severe stenosis within the P2 right PCA. 4. Redemonstrated sites of up to moderate/severe stenosis within the P2 left PCA. Electronically Signed   By: Kellie Simmering DO   On: 12/17/2020 16:01   MR BRAIN WO CONTRAST  Result Date: 12/17/2020 CLINICAL DATA:  Stroke, follow-up. EXAM: MRI HEAD WITHOUT CONTRAST MRA HEAD WITHOUT CONTRAST TECHNIQUE: Multiplanar, multi-echo pulse sequences of the brain and surrounding structures were acquired without intravenous contrast. Angiographic images of the Circle of Willis were acquired using MRA technique without intravenous contrast. COMPARISON: No pertinent prior exam. COMPARISON:  Noncontrast head CT 12/16/2020. CT angiogram head/neck and CT perfusion 12/16/2020. FINDINGS: MRI HEAD FINDINGS Brain: Mild intermittent motion degradation. Mild cerebral and cerebellar atrophy. Scattered acute infarcts within the left MCA and MCA/PCA watershed territories  affecting the cortical/subcortical left frontal, parietal and occipital lobes. Multiple acute infarcts are also present within the left insula and left basal ganglia. Suspected small chronic cortical infarct within the right occipital lobe (series 8, image 10). No evidence of intracranial mass. No chronic intracranial blood products. No extra-axial fluid collection. No midline shift. Vascular: Expected proximal arterial flow voids. SWI signal loss along the course of the M1 and M2 left MCA likely reflecting stent artifact. Skull and upper cervical spine: No focal marrow lesion. Sinuses/Orbits: Visualized orbits show no acute finding. Trace bilateral ethmoid and maxillary sinus mucosal thickening. MRA HEAD FINDINGS Anterior circulation: The intracranial  internal carotid arteries are patent. Mild stent artifact from a stent within the M1/M2 left MCA. The M1 left middle cerebral artery is now patent. Atherosclerotic irregularity of the M2 and more distal left MCA vessels. Most notably, there are sites of apparent high-grade stenosis within an inferior division proximal left M2 vessel. No proximal left M2 branch occlusion is identified. The M1 right MCA is patent. Redemonstrated moderate stenosis within the proximal right M 1 segment. No right M2 proximal branch occlusion or high-grade proximal stenosis. The anterior cerebral arteries are patent. No intracranial aneurysm is identified. Posterior circulation: The intracranial vertebral arteries are patent. The basilar artery is patent. The posterior cerebral arteries are patent. Redemonstrated atherosclerotic irregularity of the bilateral posterior cerebral arteries, most notably as follows. Multifocal severe stenoses within the P2 right PCA. Sites of up to moderate/severe stenosis within the P2 left PCA. Posterior communicating arteries are hypoplastic or absent bilaterally. Anatomic variants: As described IMPRESSION: MRI brain: 1. Scattered acute infarcts within the left MCA and MCA/PCA watershed territories affecting the cortical/subcortical left frontal, parietal and occipital lobes. Multiple acute infarcts are also present within the left insula and basal ganglia. No significant mass effect. No hemorrhagic conversion. 2. Suspected small chronic cortical infarct within the right occipital lobe. 3. Mild generalized parenchymal atrophy. MRA head: 1. Mild artifact arising from a stent within the M1/M2 left MCA. The M1 left MCA is now patent. Atherosclerotic irregularity of the M2 and more distal left MCA vessels. Most notably, there is apparent high-grade stenosis within an inferior division proximal left M2 vessel. 2. Redemonstrated moderate stenosis within the proximal M1 right MCA. 3. Redemonstrated sites of severe  stenosis within the P2 right PCA. 4. Redemonstrated sites of up to moderate/severe stenosis within the P2 left PCA. Electronically Signed   By: Kellie Simmering DO   On: 12/17/2020 16:01   CT CEREBRAL PERFUSION W CONTRAST  Result Date: 12/16/2020 CLINICAL DATA:  Right-sided deficits EXAM: CT ANGIOGRAPHY HEAD AND NECK CT PERFUSION BRAIN TECHNIQUE: Multidetector CT imaging of the head and neck was performed using the standard protocol during bolus administration of intravenous contrast. Multiplanar CT image reconstructions and MIPs were obtained to evaluate the vascular anatomy. Carotid stenosis measurements (when applicable) are obtained utilizing NASCET criteria, using the distal internal carotid diameter as the denominator. Multiphase CT imaging of the brain was performed following IV bolus contrast injection. Subsequent parametric perfusion maps were calculated using RAPID software. CONTRAST:  119mL OMNIPAQUE IOHEXOL 350 MG/ML SOLN COMPARISON:  None. FINDINGS: CTA NECK FINDINGS SKELETON: There is no bony spinal canal stenosis. No lytic or blastic lesion. OTHER NECK: Normal pharynx, larynx and major salivary glands. No cervical lymphadenopathy. Unremarkable thyroid gland. UPPER CHEST: No pneumothorax or pleural effusion. No nodules or masses. AORTIC ARCH: There is no calcific atherosclerosis of the aortic arch. There is no aneurysm, dissection or hemodynamically significant stenosis  of the visualized portion of the aorta. Conventional 3 vessel aortic branching pattern. The visualized proximal subclavian arteries are widely patent. RIGHT CAROTID SYSTEM: Normal without aneurysm, dissection or stenosis. LEFT CAROTID SYSTEM: Normal without aneurysm, dissection or stenosis. VERTEBRAL ARTERIES: Left dominant configuration. Both origins are clearly patent. There is no dissection, occlusion or flow-limiting stenosis to the skull base (V1-V3 segments). CTA HEAD FINDINGS POSTERIOR CIRCULATION: --Vertebral arteries: Normal V4  segments. --Inferior cerebellar arteries: Normal. --Basilar artery: Normal. --Superior cerebellar arteries: Normal. --Posterior cerebral arteries (PCA): Multifocal moderate-to-severe stenosis of the right P2 segment. Normal left. ANTERIOR CIRCULATION: --Intracranial internal carotid arteries: Normal. --Anterior cerebral arteries (ACA): Normal. Both A1 segments are present. Patent anterior communicating artery (a-comm). --Middle cerebral arteries (MCA): Complete occlusion of the left MCA at the proximal M1 segment with limited collateralization. Right MCA is normal. VENOUS SINUSES: As permitted by contrast timing, patent. ANATOMIC VARIANTS: None Review of the MIP images confirms the above findings. CT Brain Perfusion Findings: ASPECTS: 9 CBF (<30%) Volume: 54mL Perfusion (Tmax>6.0s) volume: 164mL Mismatch Volume: 180mL, likely slightly overestimated due to small area of completed infarction seen on the noncontrast head CT. Infarction Location:Left MCA territory IMPRESSION: 1. 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 2. Large area of ischemic penumbra in the left MCA territory. 3. 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 Critical Value/emergent results were called by telephone at the time of interpretation on 12/16/2020 at 9:02 pm to provider Christ Kick , who verbally acknowledged these results. Aortic Atherosclerosis (ICD10-I70.0). Electronically Signed   By: Ulyses Jarred M.D.   On: 12/16/2020 21:08   DG CHEST PORT 1 VIEW  Result Date: 12/17/2020 CLINICAL DATA:  Intubation. EXAM: PORTABLE CHEST 1 VIEW COMPARISON:  12/12/2020. FINDINGS: Endotracheal tube tip noted 4 cm above the carina. NG tube tip below left hemidiaphragm. Prior CABG. Heart size normal. Low lung volumes. Mild left base atelectasis/infiltrate cannot be excluded. Mild elevation left hemidiaphragm. No pleural  effusion or pneumothorax. Prior cervical spine fusion. IMPRESSION: 1.  Endotracheal tube and NG tube in good anatomic position. 2.  Prior CABG.  Heart size normal. 3. Mild left base atelectasis/infiltrate cannot be excluded. Mild elevation left hemidiaphragm. Electronically Signed   By: Marcello Moores  Register   On: 12/17/2020 05:18   DG Chest Port 1 View  Result Date: 12/12/2020 CLINICAL DATA:  Patient complaining of headache and overall not feeling well since yesterday. States that he has not taken BP meds in one day. Reports generalized pain and believes he may have been running fever at home. EXAM: PORTABLE CHEST - 1 VIEW COMPARISON:  09/14/2020 FINDINGS: Lungs are clear.  Previous CABG. Heart size and mediastinal contours are within normal limits. Aortic Atherosclerosis (ICD10-170.0). No effusion.  No pneumothorax. Visualized bones unremarkable. IMPRESSION: No acute cardiopulmonary disease post CABG. Electronically Signed   By: Lucrezia Europe M.D.   On: 12/12/2020 11:48   EEG adult  Result Date: 12/17/2020 Lora Havens, MD     12/17/2020  1:00 PM Patient Name: ANAV LAMMERT MRN: [id number] Epilepsy Attending: Lora Havens Referring Physician/Provider: Dr. 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  EEG to evaluate for seizures. Level of alertness: Awake AEDs during EEG study: Lamotrigine, Depakote Technical aspects: This EEG study was done with scalp electrodes positioned according to the 10-20  International system of electrode placement. Electrical activity was acquired at a sampling rate of 500Hz  and reviewed with a high frequency filter of 70Hz  and a low frequency filter of 1Hz . EEG data were recorded continuously and digitally stored. Description: The posterior dominant rhythm consists of 8 Hz activity of moderate voltage (25-35 uV) seen  predominantly in posterior head regions, symmetric and reactive to eye opening and eye closing. EEG showed intermittent 3 to 6 Hz theta-delta slowing in left hemisphere.  Photic driving was not seen during photic stimulation. Hyperventilation was not performed.   ABNORMALITY - Intermittent slow, left hemisphere IMPRESSION: This study is suggestive of cortical dysfunction arising from left hemisphere, nonspecific etiology. No seizures or epileptiform discharges were seen throughout the recording. Lora Havens   VAS [place] GROIN PSEUDOANEURYSM  Result Date: 12/19/2020  ARTERIAL PSEUDOANEURYSM  Patient Name:  CABELL BARAY  Date of Exam:   12/19/2020 Medical Rec #: [id number]          Accession #:    [id number] Date of Birth: Sep 05, 1965           Patient Gender: M Patient Age:   54Y Exam Location:  [facility] Procedure:      VAS [place] Gloriajean Dell Referring Phys: [id number] [place] --------------------------------------------------------------------------------  Exam: Right groin Indications: Decreased hematocrit. History: S/p right groin access. Comparison Study: No prior study Performing Technologist: Maudry Mayhew MHA, RDMS, RVT, RDCS  Examination Guidelines: A complete evaluation includes B-mode imaging, spectral Doppler, color Doppler, and power Doppler as needed of all accessible portions of each vessel. Bilateral testing is considered an integral part of a complete examination. Limited examinations for reoccurring indications may be performed as noted. +------------+----------+--------+------+----------+ Right DuplexPSV (cm/s)WaveformPlaqueComment(s) +------------+----------+--------+------+----------+ CFA            224                             +------------+----------+--------+------+----------+ Prox SFA       159                             +------------+----------+--------+------+----------+  Summary: No evidence of pseudoaneurysm, AVF or DVT     --------------------------------------------------------------------------------    Preliminary    ECHOCARDIOGRAM COMPLETE  Result Date: 12/17/2020    ECHOCARDIOGRAM REPORT   Patient Name:   JUANDAVID GONDA Date of Exam: 12/17/2020 Medical Rec #:  [id number]         Height:       67.0 in Accession #:    [id number]        Weight:       157.0 lb Date of Birth:  February 25, 1966          BSA:          1.825 m Patient Age:    35 years          BP:           96/70 mmHg Patient Gender: M                 HR:           60 bpm. Exam Location:  Inpatient Procedure: 2D Echo, Cardiac Doppler and Color Doppler Indications:    CVA  History:        Patient has prior history of Echocardiogram examinations, most  recent 03/04/2019. Previous Myocardial Infarction and CAD, Prior                 CABG, Signs/Symptoms:Shortness of Breath; Risk Factors:Diabetes.                 03/28/2016 CABG                 03/07/2016 cath.  Sonographer:    Luisa Hart RDCS Referring Phys: [id number] Lorenza Chick  Sonographer Comments: Echo performed with patient supine and on artificial respirator. IMPRESSIONS  1. Left ventricular ejection fraction, by estimation, is 65 to 70%. The left ventricle has normal function. The left ventricle has no regional wall motion abnormalities. There is moderate left ventricular hypertrophy. Left ventricular diastolic parameters are consistent with Grade I diastolic dysfunction (impaired relaxation).  2. Right ventricular systolic function is normal. The right ventricular size is normal. There is normal pulmonary artery systolic pressure. The estimated right ventricular systolic pressure is [id number] mmHg.  3. The mitral valve is normal in structure. Trivial mitral valve regurgitation. No evidence of mitral stenosis.  4. The aortic valve is normal in structure. Aortic valve regurgitation is not visualized. No aortic stenosis is present.  5. Aortic dilatation noted. There is borderline dilatation of the ascending  aorta, measuring 38 mm.  6. The inferior vena cava is normal in size with greater than 50% respiratory variability, suggesting right atrial pressure of 3 mmHg. Conclusion(s)/Recommendation(s): No intracardiac source of embolism detected on this transthoracic study. A transesophageal echocardiogram is recommended to exclude cardiac source of embolism if clinically indicated. FINDINGS  Left Ventricle: Left ventricular ejection fraction, by estimation, is 65 to 70%. The left ventricle has normal function. The left ventricle has no regional wall motion abnormalities. The left ventricular internal cavity size was normal in size. There is  moderate left ventricular hypertrophy. Left ventricular diastolic parameters are consistent with Grade I diastolic dysfunction (impaired relaxation). Right Ventricle: The right ventricular size is normal. No increase in right ventricular wall thickness. Right ventricular systolic function is normal. There is normal pulmonary artery systolic pressure. The tricuspid regurgitant velocity is 2.14 m/s, and  with an assumed right atrial pressure of 3 mmHg, the estimated right ventricular systolic pressure is [id number] mmHg. Left Atrium: Left atrial size was normal in size. Right Atrium: Right atrial size was normal in size. Pericardium: There is no evidence of pericardial effusion. Mitral Valve: The mitral valve is normal in structure. Trivial mitral valve regurgitation. No evidence of mitral valve stenosis. MV peak gradient, 8.0 mmHg. The mean mitral valve gradient is 3.0 mmHg. Tricuspid Valve: The tricuspid valve is normal in structure. Tricuspid valve regurgitation is mild . No evidence of tricuspid stenosis. Aortic Valve: The aortic valve is normal in structure. Aortic valve regurgitation is not visualized. No aortic stenosis is present. Aortic valve mean gradient measures 3.0 mmHg. Aortic valve peak gradient measures 6.2 mmHg. Aortic valve area, by VTI measures 3.55 cm. Pulmonic Valve: The  pulmonic valve was thickened with good excursion. Pulmonic valve regurgitation is mild. No evidence of pulmonic stenosis. Aorta: Aortic dilatation noted. There is borderline dilatation of the ascending aorta, measuring 38 mm. Venous: The inferior vena cava is normal in size with greater than 50% respiratory variability, suggesting right atrial pressure of 3 mmHg. IAS/Shunts: There is redundancy of the interatrial septum. No atrial level shunt detected by color flow Doppler.  LEFT VENTRICLE PLAX 2D LVIDd:         3.50 cm  Diastology LVIDs:  1.70 cm  LV e' medial:    7.40 cm/s LV PW:         1.50 cm  LV E/e' medial:  11.9 LV IVS:        1.50 cm  LV e' lateral:   9.36 cm/s LVOT diam:     2.20 cm  LV E/e' lateral: 9.4 LV SV:         95 LV SV Index:   52 LVOT Area:     3.80 cm  RIGHT VENTRICLE RV Basal diam:  4.30 cm RV Mid diam:    2.60 cm LEFT ATRIUM             Index       RIGHT ATRIUM           Index LA diam:        3.20 cm 1.75 cm/m  RA Area:     11.60 cm LA Vol (A2C):   24.9 ml 13.65 ml/m RA Volume:   26.10 ml  14.30 ml/m LA Vol (A4C):   33.7 ml 18.47 ml/m LA Biplane Vol: 30.6 ml 16.77 ml/m  AORTIC VALVE                   PULMONIC VALVE AV Area (Vmax):    3.25 cm    PV Vmax:       0.76 m/s AV Area (Vmean):   3.28 cm    PV Vmean:      50.700 cm/s AV Area (VTI):     3.55 cm    PV VTI:        0.159 m AV Vmax:           125.00 cm/s PV Peak grad:  2.3 mmHg AV Vmean:          83.600 cm/s PV Mean grad:  1.0 mmHg AV VTI:            0.269 m AV Peak Grad:      6.2 mmHg AV Mean Grad:      3.0 mmHg LVOT Vmax:         107.00 cm/s LVOT Vmean:        72.200 cm/s LVOT VTI:          0.251 m LVOT/AV VTI ratio: 0.93  AORTA Ao Root diam: 3.70 cm Ao Asc diam:  3.80 cm MITRAL VALVE                TRICUSPID VALVE MV Area (PHT): 2.76 cm     TR Peak grad:   18.3 mmHg MV Area VTI:   2.07 cm     TR Vmax:        214.00 cm/s MV Peak grad:  8.0 mmHg MV Mean grad:  3.0 mmHg     SHUNTS MV Vmax:       1.41 m/s     Systemic VTI:   0.25 m MV Vmean:      80.8 cm/s    Systemic Diam: 2.20 cm MV Decel Time: 275 msec MV E velocity: 88.10 cm/s MV A velocity: 109.00 cm/s MV E/A ratio:  0.81 Cherlynn Kaiser MD Electronically signed by Cherlynn Kaiser MD Signature Date/Time: 12/17/2020/1:53:18 PM    Final    CT HEAD CODE STROKE WO CONTRAST  Result Date: 12/16/2020 CLINICAL DATA:  Code stroke.  Right-sided deficits EXAM: CT HEAD WITHOUT CONTRAST TECHNIQUE: Contiguous axial images were obtained from the base of the skull through the vertex without intravenous contrast. COMPARISON:  12/12/2020  FINDINGS: Brain: There is no mass, hemorrhage or extra-axial collection. The size and configuration of the ventricles and extra-axial CSF spaces are normal. Area of hypoattenuation in the inferior left frontal lobe, likely early subacute infarct. Small vessel infarcts in the left corona radiata are unchanged. Vascular: No abnormal hyperdensity of the major intracranial arteries or dural venous sinuses. No intracranial atherosclerosis. Skull: The visualized skull base, calvarium and extracranial soft tissues are normal. Sinuses/Orbits: No fluid levels or advanced mucosal thickening of the visualized paranasal sinuses. No mastoid or middle ear effusion. The orbits are normal. ASPECTS [facility] Stroke Program Early CT Score) - Ganglionic level infarction (caudate, lentiform nuclei, internal capsule, insula, M1-M3 cortex): 6 - Supraganglionic infarction (M4-M6 cortex): 3 Total score (0-10 with 10 being normal): 9 IMPRESSION: 1. No acute intracranial hemorrhage. 2. Suspected early subacute infarct of the inferior left frontal lobe. 3. ASPECTS is 9. These results were called by telephone at the time of interpretation on 12/16/2020 at 8:25 pm to provider [facility] , who verbally acknowledged these results. Electronically Signed   By: Ulyses Jarred M.D.   On: 12/16/2020 20:26   CT ANGIO HEAD CODE STROKE  Result Date: 12/16/2020 CLINICAL DATA:  Right-sided deficits  EXAM: CT ANGIOGRAPHY HEAD AND NECK CT PERFUSION BRAIN TECHNIQUE: Multidetector CT imaging of the head and neck was performed using the standard protocol during bolus administration of intravenous contrast. Multiplanar CT image reconstructions and MIPs were obtained to evaluate the vascular anatomy. Carotid stenosis measurements (when applicable) are obtained utilizing NASCET criteria, using the distal internal carotid diameter as the denominator. Multiphase CT imaging of the brain was performed following IV bolus contrast injection. Subsequent parametric perfusion maps were calculated using RAPID software. CONTRAST:  143mL OMNIPAQUE IOHEXOL 350 MG/ML SOLN COMPARISON:  None. FINDINGS: CTA NECK FINDINGS SKELETON: There is no bony spinal canal stenosis. No lytic or blastic lesion. OTHER NECK: Normal pharynx, larynx and major salivary glands. No cervical lymphadenopathy. Unremarkable thyroid gland. UPPER CHEST: No pneumothorax or pleural effusion. No nodules or masses. AORTIC ARCH: There is no calcific atherosclerosis of the aortic arch. There is no aneurysm, dissection or hemodynamically significant stenosis of the visualized portion of the aorta. Conventional 3 vessel aortic branching pattern. The visualized proximal subclavian arteries are widely patent. RIGHT CAROTID SYSTEM: Normal without aneurysm, dissection or stenosis. LEFT CAROTID SYSTEM: Normal without aneurysm, dissection or stenosis. VERTEBRAL ARTERIES: Left dominant configuration. Both origins are clearly patent. There is no dissection, occlusion or flow-limiting stenosis to the skull base (V1-V3 segments). CTA HEAD FINDINGS POSTERIOR CIRCULATION: --Vertebral arteries: Normal V4 segments. --Inferior cerebellar arteries: Normal. --Basilar artery: Normal. --Superior cerebellar arteries: Normal. --Posterior cerebral arteries (PCA): Multifocal moderate-to-severe stenosis of the right P2 segment. Normal left. ANTERIOR CIRCULATION: --Intracranial internal carotid  arteries: Normal. --Anterior cerebral arteries (ACA): Normal. Both A1 segments are present. Patent anterior communicating artery (a-comm). --Middle cerebral arteries (MCA): Complete occlusion of the left MCA at the proximal M1 segment with limited collateralization. Right MCA is normal. VENOUS SINUSES: As permitted by contrast timing, patent. ANATOMIC VARIANTS: None Review of the MIP images confirms the above findings. CT Brain Perfusion Findings: ASPECTS: 9 CBF (<30%) Volume: 69mL Perfusion (Tmax>6.0s) volume: 172mL Mismatch Volume: 167mL, likely slightly overestimated due to small area of completed infarction seen on the noncontrast head CT. Infarction Location:Left MCA territory IMPRESSION: 1. 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 2. Large area of ischemic penumbra in the left MCA territory. 3. Multifocal  moderate-to-severe stenosis of the right posterior cerebral artery P2 segment. Critical Value/emergent results were called by telephone at the time of interpretation on 12/16/2020 at 9:02 pm to provider Christ Kick , who verbally acknowledged these results. Aortic Atherosclerosis (ICD10-I70.0). Electronically Signed   By: Ulyses Jarred M.D.   On: 12/16/2020 21:08   CT ANGIO NECK CODE STROKE  Result Date: 12/16/2020 CLINICAL DATA:  Right-sided deficits EXAM: CT ANGIOGRAPHY HEAD AND NECK CT PERFUSION BRAIN TECHNIQUE: Multidetector CT imaging of the head and neck was performed using the standard protocol during bolus administration of intravenous contrast. Multiplanar CT image reconstructions and MIPs were obtained to evaluate the vascular anatomy. Carotid stenosis measurements (when applicable) are obtained utilizing NASCET criteria, using the distal internal carotid diameter as the denominator. Multiphase CT imaging of the brain was performed following IV bolus contrast injection. Subsequent parametric perfusion maps were  calculated using RAPID software. CONTRAST:  119mL OMNIPAQUE IOHEXOL 350 MG/ML SOLN COMPARISON:  None. FINDINGS: CTA NECK FINDINGS SKELETON: There is no bony spinal canal stenosis. No lytic or blastic lesion. OTHER NECK: Normal pharynx, larynx and major salivary glands. No cervical lymphadenopathy. Unremarkable thyroid gland. UPPER CHEST: No pneumothorax or pleural effusion. No nodules or masses. AORTIC ARCH: There is no calcific atherosclerosis of the aortic arch. There is no aneurysm, dissection or hemodynamically significant stenosis of the visualized portion of the aorta. Conventional 3 vessel aortic branching pattern. The visualized proximal subclavian arteries are widely patent. RIGHT CAROTID SYSTEM: Normal without aneurysm, dissection or stenosis. LEFT CAROTID SYSTEM: Normal without aneurysm, dissection or stenosis. VERTEBRAL ARTERIES: Left dominant configuration. Both origins are clearly patent. There is no dissection, occlusion or flow-limiting stenosis to the skull base (V1-V3 segments). CTA HEAD FINDINGS POSTERIOR CIRCULATION: --Vertebral arteries: Normal V4 segments. --Inferior cerebellar arteries: Normal. --Basilar artery: Normal. --Superior cerebellar arteries: Normal. --Posterior cerebral arteries (PCA): Multifocal moderate-to-severe stenosis of the right P2 segment. Normal left. ANTERIOR CIRCULATION: --Intracranial internal carotid arteries: Normal. --Anterior cerebral arteries (ACA): Normal. Both A1 segments are present. Patent anterior communicating artery (a-comm). --Middle cerebral arteries (MCA): Complete occlusion of the left MCA at the proximal M1 segment with limited collateralization. Right MCA is normal. VENOUS SINUSES: As permitted by contrast timing, patent. ANATOMIC VARIANTS: None Review of the MIP images confirms the above findings. CT Brain Perfusion Findings: ASPECTS: 9 CBF (<30%) Volume: 19mL Perfusion (Tmax>6.0s) volume: 163mL Mismatch Volume: 131mL, likely slightly overestimated due  to small area of completed infarction seen on the noncontrast head CT. Infarction Location:Left MCA territory IMPRESSION: 1. 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 2. Large area of ischemic penumbra in the left MCA territory. 3. 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 Critical Value/emergent results were called by telephone at the time of interpretation on 12/16/2020 at 9:02 pm to provider Christ Kick , who verbally acknowledged these results. Aortic Atherosclerosis (ICD10-I70.0). Electronically Signed   By: Ulyses Jarred M.D.   On: 12/16/2020 21:08    PHYSICAL EXAM  Temp:  [97.6 F (36.4 C)-98.5 F (36.9 C)] 98.2 F (36.8 C) (05/22 0800) Pulse Rate:  [56-89] 56 (05/22 0800) Resp:  [14-18] 16 (05/22 0800) BP: (117-165)/(54-82) 157/62 (05/22 0800) SpO2:  [99 %-100 %] 100 % (05/22 0800)  General - Well nourished, well developed, not in acute distress, feeding himself breakfast really well.  Ophthalmologic - fundi not visualized due to noncooperation.  Cardiovascular - Regular rhythm and bradycardia.  Neuro - awake,  alert, eyes open, oriented to people, age and self, but not to time.  Slightly nonfluent speech with occasional low paraphasic errors with mild perseveration, mild dysarthria, following all simple commands. Able to repeat simple sentences but complex sentences, able to name 3/3. No gaze palsy, tracking bilaterally, visual field full, PERRL. No facial droop. Tongue midline. LUE no drift, RUE pronator drift, but finger grip 4/5 bilaterally. BLE 3/5 proximal, distally left 5/5 and right 4/5. Sensation symmetrical bilaterally, b/l FTN intact grossly, gait not tested.   ASSESSMENT/PLAN [name] is a 55 y.o. male with history of seizure on Depakote and Lamictal, CAD status post CABG, diabetes, hypertension, hyperlipidemia, OSA, stage 3 CKD,  substance abuse admitted for seizure, right-sided weakness and slurred speech. No tPA given due to outside window.    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   CT repeat left MCA infarct with trace SAH  MRI scattered left MCA infarcts. small chronic cortical infarct within the right occipital lobe.  MRA left M1 patent, high-grade stenosis inferior left M2, moderate stenosis right M1, severe stenosis, bilateral P2.  2D Echo  EF 65-70%  LDL 116  HgbA1c 9.7  Lovenox for VTE prophylaxis  clopidogrel 75 mg daily prior to admission, now on Brilinta (ticagrelor) 90 mg bid post stent, patient has allergy to aspirin.  Patient counseled to be compliant with his antithrombotic medications   Possible pseudoanerysm at groin site: check ultrasound today, Hgb up to 10.5 and HCT 30 on 12 noon check.   Ongoing aggressive stroke risk factor management  Therapy recommendations: CIR, patient was initially reluctant but has now agreed, his sister, [name], is on board with this plan and will encourage patient to seek admission at [place] per our phone conversation today  Disposition: CIR  Seizure  History of seizure on Depakote and Lamictal  Recent breakthrough seizure  On Depakote 1500 twice daily PTA -> 1000 bid  lamictal 50 twice daily   EEG no seizure  Depakote level 39->150->52, continue depakote 1000mg  bid  depakote level daily  Diabetes Hypoglycemia 5/20  HgbA1c 9.7 goal < 7.0  Uncontrolled  Hypoglycemia x one on 5/20  CBG monitoring stable past 24 hours   SSI  DM coordinator consulted, appreciate recs  DM education and close PCP follow up  Hypertension . Stable . BP goal 120-140 within 24h of IR  Long term BP goal 130-150 given intracranial  stenosis  Hyperlipidemia  Home meds: Lipitor 40  LDL 116, goal < 70  Now on Lipitor 80  Continue statin at discharge   Tobacco abuse  Current smoker  Smoking cessation counseling provided  Pt is willing to quit  AKI, stage3 CKD Urine retention  Cre 1.33->2.2->1.74->1.75 (baseline seems to be 1.5-1.7)  Continue gentle hydration with MIVF  Foley placed 5/20  Flomax on board  Consider voiding trial around 5/25  Other Stroke Risk Factors  UDS positive THC, cessation education provided  ETOH use  Hx stroke/TIA - old infarct on MRI  Coronary artery disease status post CABG  Obstructive sleep apnea, on CPAP at home  Other Active Problems  I plan continue mobilization out of bed and therapy consults.  Add hydrochlorothiazide for elevated blood pressure.  Rehabilitation consult.  Continue to follow hematocrit.  Greater than 50% time during  this 25-minute visit was spent on counseling and coordination of care about his stroke and plan for rehabilitation and answering questions.  Antony Contras MD  To contact Stroke Continuity provider, please refer to [email or web address] After hours, contact General Neurology

## 2020-12-20 NOTE — TOC Progression Note (Signed)
Transition of Care [facility]) - Progression Note    Patient Details  Name: Melvin Taylor MRN: [id number] Date of Birth: [birth year]  [facility] CM/SW Contact  Carles Collet, RN Phone Number: 12/20/2020, 12:48 PM  Clinical Narrative:    Received return call from patient's sister. She confirmed patient has HHA 6 days a week/ 4 hours. We discussed HH that would be available through [facility] if [facility] provider could be found, and level of therapy he would receive at CIR. [name] states that she has had a stroke many years and understands that he would have the best recovery if he was able to go to CIR. She is agreeable to CIR if it can be arranged and would like to try to pursue that option first.     Rondell Reams Sister   [phone]       Expected Discharge Plan: Home/Self Care Barriers to Discharge: Continued Medical Work up  Expected Discharge Plan and Services Expected Discharge Plan: Home/Self Care                                               Social Determinants of Health (SDOH) Interventions    Readmission Risk Interventions No flowsheet data found.

## 2020-12-20 NOTE — Progress Notes (Signed)
STROKE TEAM PROGRESS NOTE   SUBJECTIVE (INTERVAL HISTORY) No acute events overnight.  He complains of pain in his right groin and tenderness to touch.  Hematocrit this morning of had dropped to 26.5 plan to repeat at noon today vital signs are stable.  Neurological exam unchanged. Today he reports he is fine, no new symptoms. His affect is flat. He provides one word answers. He does not engage in conversation. We discussed the plan of care including discharge to CIR. Patient stated he did not want to go rehab. We discussed the reasoning behind CIR recommendation. He is willing to consider it.     OBJECTIVE Temp:  [97.6 F (36.4 C)-98.5 F (36.9 C)] 98.2 F (36.8 C) (05/22 0800) Pulse Rate:  [55-89] 56 (05/22 0800) Cardiac Rhythm: Normal sinus rhythm (05/22 0806) Resp:  [14-18] 16 (05/22 0800) BP: (117-176)/(54-88) 157/62 (05/22 0800) SpO2:  [99 %-100 %] 100 % (05/22 0800)  Recent Labs  Lab 12/19/20 1136 12/19/20 1704 12/19/20 2000 12/19/20 2241 12/20/20 0647  GLUCAP 216* 231* 227* 202* 115*   Recent Labs  Lab 12/15/20 1341 12/16/20 2028 12/16/20 2038 12/17/20 0237 12/17/20 0254 12/18/20 0429 12/19/20 0047 12/20/20 0124  NA  --  135 139 139 136 138 136 137  K  --  3.7 3.7 3.5 3.7 3.7 4.2 3.7  CL  --  102 102  --  111 114* 111 108  CO2  --  27  --   --  17* 19* 21* 22  GLUCOSE  --  175* 159*  --  235* 86 160* 96  BUN  --  22* 21*  --  18 19 23* 20  CREATININE  --  2.15* 2.20*  --  1.75* 1.74* 1.71* 1.61*  CALCIUM  --  7.7*  --   --  7.2* 7.8* 7.9* 8.4*  MG 1.7  --   --   --  1.6* 2.8*  --   --   PHOS  --   --   --   --   --  4.3  --   --    Recent Labs  Lab 12/15/20 0929 12/16/20 2028  AST 20 16  ALT 13 12  ALKPHOS 41 39  BILITOT 0.7 0.5  PROT 5.5* 5.1*  ALBUMIN 2.8* 2.8*   Recent Labs  Lab 12/16/20 2028 12/16/20 2038 12/17/20 0254 12/18/20 0429 12/19/20 0047 12/19/20 1157 12/20/20 0124  WBC 4.2  --  5.7 6.2 4.6 4.5 5.9  NEUTROABS 1.5*  --  4.5 4.4  2.9  --  4.8  HGB 12.0*   < > 11.3* 11.1* 9.3* 10.5* 10.0*  HCT 34.5*   < > 32.8* 31.5* 26.5* 30.3* 28.1*  MCV 91.0  --  90.1 89.7 89.2 90.2 88.1  PLT 183  --  173 158 126* 129* PLATELET CLUMPS NOTED ON SMEAR, COUNT APPEARS DECREASED   < > = values in this interval not displayed.   No results for input(s): CKTOTAL, CKMB, CKMBINDEX, TROPONINI in the last 168 hours. No results for input(s): LABPROT, INR in the last 72 hours. No results for input(s): COLORURINE, LABSPEC, [place], GLUCOSEU, HGBUR, BILIRUBINUR, KETONESUR, PROTEINUR, UROBILINOGEN, NITRITE, LEUKOCYTESUR in the last 72 hours.  Invalid input(s): APPERANCEUR     Component Value Date/Time   CHOL 224 (H) 12/17/2020 0254   CHOL 286 (H) 02/12/2020 1334   TRIG 146 12/17/2020 0254   HDL 79 12/17/2020 0254   HDL 118 02/12/2020 1334   CHOLHDL 2.8 12/17/2020 0254   VLDL  29 12/17/2020 0254   LDLCALC 116 (H) 12/17/2020 0254   LDLCALC 145 (H) 02/12/2020 1334   Lab Results  Component Value Date   HGBA1C 9.7 (H) 12/17/2020      Component Value Date/Time   LABOPIA NONE DETECTED 12/17/2020 0254   COCAINSCRNUR NONE DETECTED 12/17/2020 0254   LABBENZ NONE DETECTED 12/17/2020 0254   AMPHETMU NONE DETECTED 12/17/2020 0254   THCU POSITIVE (A) 12/17/2020 0254   LABBARB NONE DETECTED 12/17/2020 0254    Recent Labs  Lab 12/15/20 0930 12/16/20 2028  ETH <10 <10    I have personally reviewed the radiological images below and agree with the radiology interpretations.  DG Sacrum/Coccyx  Result Date: 11/30/2020 CLINICAL DATA:  Recent fall with sacral pain, initial encounter EXAM: SACRUM AND COCCYX - 2+ VIEW COMPARISON:  None. FINDINGS: Sacrum is well visualized. Sacral ala are unremarkable. No acute fracture is seen. IMPRESSION: No acute abnormality noted. Electronically Signed   By: Inez Catalina M.D.   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 EXAM: CT HEAD WITHOUT CONTRAST TECHNIQUE: Contiguous axial images were obtained from the base of the skull through the vertex without intravenous contrast. COMPARISON:  CTA, CTP and CT head yesterday. FINDINGS: Brain: Pseudo normalization of the left inferior frontal gyrus which was hypodense at presentation yesterday (series 3, image 19). Trace subarachnoid hemorrhage in the left central sulcus on series 3, image 24. Trace contrast staining suspected in the left inferior basal ganglia series 3, image 15. And questionable additional mild gyral contrast staining in the anterior left MCA territory. Scattered areas of subcortical and central white matter hypodensity in the left frontal lobe are otherwise stable to decreased in conspicuity (series 3, image 22). But there is no confluent cytotoxic edema evident, and no intracranial mass effect or midline shift. No ventriculomegaly. No other intracranial hemorrhage identified. Vascular: Left MCA M1 region vascular stent is new since presentation. Skull: No acute osseous abnormality identified. Sinuses/Orbits: Trace fluid and mucosal thickening in the paranasal sinuses now. Mastoids and tympanic cavities remain clear. Other: Intubated. Fluid in the nasopharynx. Negative orbit and scalp soft tissues. IMPRESSION: 1. Some pseudo-normalization of the left inferior frontal gyrus hypodensity since the presentation CT yesterday which might be related to contrast staining. But there is otherwise little to no cytotoxic edema evident in the Left MCA territory. 2. Trace subarachnoid hemorrhage in the left hemisphere. No malignant hemorrhagic transformation or intracranial mass effect. 3. New left MCA M1 stent. Electronically Signed   By: Genevie Ann M.D.   On: 12/17/2020 06:15   CT Head Wo Contrast  Result Date: 12/12/2020 CLINICAL DATA:  55 year old with acute onset of headache and possible fever that began yesterday. Current history  of hypertension. EXAM: CT HEAD WITHOUT CONTRAST TECHNIQUE: Contiguous axial images were obtained from the base of the skull through the vertex without intravenous contrast. COMPARISON:  06/09/2018 and earlier. FINDINGS: Brain: Ventricular system normal in size and appearance for age. No mass lesion. No midline shift. No acute hemorrhage or hematoma. No extra-axial fluid collections. No evidence of acute infarction. No interval change. Vascular: Mild BILATERAL carotid siphon atherosclerosis. No hyperdense vessel. Skull: No skull fracture or other focal osseous abnormality involving the skull. Sinuses/Orbits: Visualized paranasal sinuses, bilateral mastoid air cells and bilateral middle ear cavities well-aerated. Visualized orbits and globes normal in appearance. Other: None. IMPRESSION: No acute intracranial  abnormality.  Stable examination. Electronically Signed   By: Evangeline Dakin M.D.   On: 12/12/2020 11:57   MR ANGIO HEAD WO CONTRAST  Result Date: 12/17/2020 CLINICAL DATA:  Stroke, follow-up. EXAM: MRI HEAD WITHOUT CONTRAST MRA HEAD WITHOUT CONTRAST TECHNIQUE: Multiplanar, multi-echo pulse sequences of the brain and surrounding structures were acquired without intravenous contrast. Angiographic images of the Circle of Willis were acquired using MRA technique without intravenous contrast. COMPARISON: No pertinent prior exam. COMPARISON:  Noncontrast head CT 12/16/2020. CT angiogram head/neck and CT perfusion 12/16/2020. FINDINGS: MRI HEAD FINDINGS Brain: Mild intermittent motion degradation. Mild cerebral and cerebellar atrophy. Scattered acute infarcts within the left MCA and MCA/PCA watershed territories affecting the cortical/subcortical left frontal, parietal and occipital lobes. Multiple acute infarcts are also present within the left insula and left basal ganglia. Suspected small chronic cortical infarct within the right occipital lobe (series 8, image 10). No evidence of intracranial mass. No chronic  intracranial blood products. No extra-axial fluid collection. No midline shift. Vascular: Expected proximal arterial flow voids. SWI signal loss along the course of the M1 and M2 left MCA likely reflecting stent artifact. Skull and upper cervical spine: No focal marrow lesion. Sinuses/Orbits: Visualized orbits show no acute finding. Trace bilateral ethmoid and maxillary sinus mucosal thickening. MRA HEAD FINDINGS Anterior circulation: The intracranial internal carotid arteries are patent. Mild stent artifact from a stent within the M1/M2 left MCA. The M1 left middle cerebral artery is now patent. Atherosclerotic irregularity of the M2 and more distal left MCA vessels. Most notably, there are sites of apparent high-grade stenosis within an inferior division proximal left M2 vessel. No proximal left M2 branch occlusion is identified. The M1 right MCA is patent. Redemonstrated moderate stenosis within the proximal right M 1 segment. No right M2 proximal branch occlusion or high-grade proximal stenosis. The anterior cerebral arteries are patent. No intracranial aneurysm is identified. Posterior circulation: The intracranial vertebral arteries are patent. The basilar artery is patent. The posterior cerebral arteries are patent. Redemonstrated atherosclerotic irregularity of the bilateral posterior cerebral arteries, most notably as follows. Multifocal severe stenoses within the P2 right PCA. Sites of up to moderate/severe stenosis within the P2 left PCA. Posterior communicating arteries are hypoplastic or absent bilaterally. Anatomic variants: As described IMPRESSION: MRI brain: 1. Scattered acute infarcts within the left MCA and MCA/PCA watershed territories affecting the cortical/subcortical left frontal, parietal and occipital lobes. Multiple acute infarcts are also present within the left insula and basal ganglia. No significant mass effect. No hemorrhagic conversion. 2. Suspected small chronic cortical infarct  within the right occipital lobe. 3. Mild generalized parenchymal atrophy. MRA head: 1. Mild artifact arising from a stent within the M1/M2 left MCA. The M1 left MCA is now patent. Atherosclerotic irregularity of the M2 and more distal left MCA vessels. Most notably, there is apparent high-grade stenosis within an inferior division proximal left M2 vessel. 2. Redemonstrated moderate stenosis within the proximal M1 right MCA. 3. Redemonstrated sites of severe stenosis within the P2 right PCA. 4. Redemonstrated sites of up to moderate/severe stenosis within the P2 left PCA. Electronically Signed   By: Kellie Simmering DO   On: 12/17/2020 16:01   MR BRAIN WO CONTRAST  Result Date: 12/17/2020 CLINICAL DATA:  Stroke, follow-up. EXAM: MRI HEAD WITHOUT CONTRAST MRA HEAD WITHOUT CONTRAST TECHNIQUE: Multiplanar, multi-echo pulse sequences of the brain and surrounding structures were acquired without intravenous contrast. Angiographic images of the Circle of Willis were acquired using MRA technique without intravenous contrast. COMPARISON: No  pertinent prior exam. COMPARISON:  Noncontrast head CT 12/16/2020. CT angiogram head/neck and CT perfusion 12/16/2020. FINDINGS: MRI HEAD FINDINGS Brain: Mild intermittent motion degradation. Mild cerebral and cerebellar atrophy. Scattered acute infarcts within the left MCA and MCA/PCA watershed territories affecting the cortical/subcortical left frontal, parietal and occipital lobes. Multiple acute infarcts are also present within the left insula and left basal ganglia. Suspected small chronic cortical infarct within the right occipital lobe (series 8, image 10). No evidence of intracranial mass. No chronic intracranial blood products. No extra-axial fluid collection. No midline shift. Vascular: Expected proximal arterial flow voids. SWI signal loss along the course of the M1 and M2 left MCA likely reflecting stent artifact. Skull and upper cervical spine: No focal marrow lesion.  Sinuses/Orbits: Visualized orbits show no acute finding. Trace bilateral ethmoid and maxillary sinus mucosal thickening. MRA HEAD FINDINGS Anterior circulation: The intracranial internal carotid arteries are patent. Mild stent artifact from a stent within the M1/M2 left MCA. The M1 left middle cerebral artery is now patent. Atherosclerotic irregularity of the M2 and more distal left MCA vessels. Most notably, there are sites of apparent high-grade stenosis within an inferior division proximal left M2 vessel. No proximal left M2 branch occlusion is identified. The M1 right MCA is patent. Redemonstrated moderate stenosis within the proximal right M 1 segment. No right M2 proximal branch occlusion or high-grade proximal stenosis. The anterior cerebral arteries are patent. No intracranial aneurysm is identified. Posterior circulation: The intracranial vertebral arteries are patent. The basilar artery is patent. The posterior cerebral arteries are patent. Redemonstrated atherosclerotic irregularity of the bilateral posterior cerebral arteries, most notably as follows. Multifocal severe stenoses within the P2 right PCA. Sites of up to moderate/severe stenosis within the P2 left PCA. Posterior communicating arteries are hypoplastic or absent bilaterally. Anatomic variants: As described IMPRESSION: MRI brain: 1. Scattered acute infarcts within the left MCA and MCA/PCA watershed territories affecting the cortical/subcortical left frontal, parietal and occipital lobes. Multiple acute infarcts are also present within the left insula and basal ganglia. No significant mass effect. No hemorrhagic conversion. 2. Suspected small chronic cortical infarct within the right occipital lobe. 3. Mild generalized parenchymal atrophy. MRA head: 1. Mild artifact arising from a stent within the M1/M2 left MCA. The M1 left MCA is now patent. Atherosclerotic irregularity of the M2 and more distal left MCA vessels. Most notably, there is  apparent high-grade stenosis within an inferior division proximal left M2 vessel. 2. Redemonstrated moderate stenosis within the proximal M1 right MCA. 3. Redemonstrated sites of severe stenosis within the P2 right PCA. 4. Redemonstrated sites of up to moderate/severe stenosis within the P2 left PCA. Electronically Signed   By: Kellie Simmering DO   On: 12/17/2020 16:01   CT CEREBRAL PERFUSION W CONTRAST  Result Date: 12/16/2020 CLINICAL DATA:  Right-sided deficits EXAM: CT ANGIOGRAPHY HEAD AND NECK CT PERFUSION BRAIN TECHNIQUE: Multidetector CT imaging of the head and neck was performed using the standard protocol during bolus administration of intravenous contrast. Multiplanar CT image reconstructions and MIPs were obtained to evaluate the vascular anatomy. Carotid stenosis measurements (when applicable) are obtained utilizing NASCET criteria, using the distal internal carotid diameter as the denominator. Multiphase CT imaging of the brain was performed following IV bolus contrast injection. Subsequent parametric perfusion maps were calculated using RAPID software. CONTRAST:  137mL OMNIPAQUE IOHEXOL 350 MG/ML SOLN COMPARISON:  None. FINDINGS: CTA NECK FINDINGS SKELETON: There is no bony spinal canal stenosis. No lytic or blastic lesion. OTHER NECK: Normal pharynx, larynx  and major salivary glands. No cervical lymphadenopathy. Unremarkable thyroid gland. UPPER CHEST: No pneumothorax or pleural effusion. No nodules or masses. AORTIC ARCH: There is no calcific atherosclerosis of the aortic arch. There is no aneurysm, dissection or hemodynamically significant stenosis of the visualized portion of the aorta. Conventional 3 vessel aortic branching pattern. The visualized proximal subclavian arteries are widely patent. RIGHT CAROTID SYSTEM: Normal without aneurysm, dissection or stenosis. LEFT CAROTID SYSTEM: Normal without aneurysm, dissection or stenosis. VERTEBRAL ARTERIES: Left dominant configuration. Both origins  are clearly patent. There is no dissection, occlusion or flow-limiting stenosis to the skull base (V1-V3 segments). CTA HEAD FINDINGS POSTERIOR CIRCULATION: --Vertebral arteries: Normal V4 segments. --Inferior cerebellar arteries: Normal. --Basilar artery: Normal. --Superior cerebellar arteries: Normal. --Posterior cerebral arteries (PCA): Multifocal moderate-to-severe stenosis of the right P2 segment. Normal left. ANTERIOR CIRCULATION: --Intracranial internal carotid arteries: Normal. --Anterior cerebral arteries (ACA): Normal. Both A1 segments are present. Patent anterior communicating artery (a-comm). --Middle cerebral arteries (MCA): Complete occlusion of the left MCA at the proximal M1 segment with limited collateralization. Right MCA is normal. VENOUS SINUSES: As permitted by contrast timing, patent. ANATOMIC VARIANTS: None Review of the MIP images confirms the above findings. CT Brain Perfusion Findings: ASPECTS: 9 CBF (<30%) Volume: 72mL Perfusion (Tmax>6.0s) volume: 189mL Mismatch Volume: 142mL, likely slightly overestimated due to small area of completed infarction seen on the noncontrast head CT. Infarction Location:Left MCA territory IMPRESSION: 1. 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 2. Large area of ischemic penumbra in the left MCA territory. 3. 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 Critical Value/emergent results were called by telephone at the time of interpretation on 12/16/2020 at 9:02 pm to provider Christ Kick , who verbally acknowledged these results. Aortic Atherosclerosis (ICD10-I70.0). Electronically Signed   By: Ulyses Jarred M.D.   On: 12/16/2020 21:08   DG CHEST PORT 1 VIEW  Result Date: 12/17/2020 CLINICAL DATA:  Intubation. EXAM: PORTABLE CHEST 1 VIEW COMPARISON:  12/12/2020. FINDINGS: Endotracheal tube tip noted 4 cm above the carina. NG tube  tip below left hemidiaphragm. Prior CABG. Heart size normal. Low lung volumes. Mild left base atelectasis/infiltrate cannot be excluded. Mild elevation left hemidiaphragm. No pleural effusion or pneumothorax. Prior cervical spine fusion. IMPRESSION: 1.  Endotracheal tube and NG tube in good anatomic position. 2.  Prior CABG.  Heart size normal. 3. Mild left base atelectasis/infiltrate cannot be excluded. Mild elevation left hemidiaphragm. Electronically Signed   By: Marcello Moores  Register   On: 12/17/2020 05:18   DG Chest Port 1 View  Result Date: 12/12/2020 CLINICAL DATA:  Patient complaining of headache and overall not feeling well since yesterday. States that he has not taken BP meds in one day. Reports generalized pain and believes he may have been running fever at home. EXAM: PORTABLE CHEST - 1 VIEW COMPARISON:  09/14/2020 FINDINGS: Lungs are clear.  Previous CABG. Heart size and mediastinal contours are within normal limits. Aortic Atherosclerosis (ICD10-170.0). No effusion.  No pneumothorax. Visualized bones unremarkable. IMPRESSION: No acute cardiopulmonary disease post CABG. Electronically Signed   By: Lucrezia Europe M.D.   On: 12/12/2020 11:48   EEG adult  Result Date: 12/17/2020 Lora Havens, MD     12/17/2020  1:00 PM Patient Name: ANZEL MATHIASON MRN: [id number] Epilepsy Attending: Lora Havens Referring Physician/Provider: Dr. 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  EEG to evaluate for seizures. Level of alertness: Awake AEDs during EEG study: Lamotrigine, Depakote Technical aspects: This EEG study was done with scalp electrodes positioned according to the 10-20 International system of electrode placement. Electrical activity was acquired at a sampling rate of 500Hz  and reviewed with a high frequency filter of 70Hz  and a low frequency  filter of 1Hz . EEG data were recorded continuously and digitally stored. Description: The posterior dominant rhythm consists of 8 Hz activity of moderate voltage (25-35 uV) seen predominantly in posterior head regions, symmetric and reactive to eye opening and eye closing. EEG showed intermittent 3 to 6 Hz theta-delta slowing in left hemisphere.  Photic driving was not seen during photic stimulation. Hyperventilation was not performed.   ABNORMALITY - Intermittent slow, left hemisphere IMPRESSION: This study is suggestive of cortical dysfunction arising from left hemisphere, nonspecific etiology. No seizures or epileptiform discharges were seen throughout the recording. Lora Havens   VAS [place] GROIN PSEUDOANEURYSM  Result Date: 12/19/2020  ARTERIAL PSEUDOANEURYSM  Patient Name:  GREENE DIODATO  Date of Exam:   12/19/2020 Medical Rec #: [id number]          Accession #:    [id number] Date of Birth: 04/17/1966           Patient Gender: M Patient Age:   54Y Exam Location:  [facility] Procedure:      VAS [place] Gloriajean Dell Referring Phys: [id number] [place] --------------------------------------------------------------------------------  Exam: Right groin Indications: Decreased hematocrit. History: S/p right groin access. Comparison Study: No prior study Performing Technologist: Maudry Mayhew MHA, RDMS, RVT, RDCS  Examination Guidelines: A complete evaluation includes B-mode imaging, spectral Doppler, color Doppler, and power Doppler as needed of all accessible portions of each vessel. Bilateral testing is considered an integral part of a complete examination. Limited examinations for reoccurring indications may be performed as noted. +------------+----------+--------+------+----------+ Right DuplexPSV (cm/s)WaveformPlaqueComment(s) +------------+----------+--------+------+----------+ CFA            224                              +------------+----------+--------+------+----------+ Prox SFA       159                             +------------+----------+--------+------+----------+  Summary: No evidence of pseudoaneurysm, AVF or DVT    --------------------------------------------------------------------------------    Preliminary    ECHOCARDIOGRAM COMPLETE  Result Date: 12/17/2020    ECHOCARDIOGRAM REPORT   Patient Name:   ATIBA KIMBERLIN Date of Exam: 12/17/2020 Medical Rec #:  [id number]         Height:       67.0 in Accession #:    [id number]        Weight:       157.0 lb Date of Birth:  1966-07-07          BSA:          1.825 m Patient Age:    54 years          BP:           96/70 mmHg Patient Gender: M                 HR:           60 bpm. Exam Location:  Inpatient Procedure: 2D  Echo, Cardiac Doppler and Color Doppler Indications:    CVA  History:        Patient has prior history of Echocardiogram examinations, most                 recent 03/04/2019. Previous Myocardial Infarction and CAD, Prior                 CABG, Signs/Symptoms:Shortness of Breath; Risk Factors:Diabetes.                 03/28/2016 CABG                 03/07/2016 cath.  Sonographer:    Luisa Hart RDCS Referring Phys: [id number] Lorenza Chick  Sonographer Comments: Echo performed with patient supine and on artificial respirator. IMPRESSIONS  1. Left ventricular ejection fraction, by estimation, is 65 to 70%. The left ventricle has normal function. The left ventricle has no regional wall motion abnormalities. There is moderate left ventricular hypertrophy. Left ventricular diastolic parameters are consistent with Grade I diastolic dysfunction (impaired relaxation).  2. Right ventricular systolic function is normal. The right ventricular size is normal. There is normal pulmonary artery systolic pressure. The estimated right ventricular systolic pressure is [id number] mmHg.  3. The mitral valve is normal in structure. Trivial mitral valve regurgitation. No evidence of  mitral stenosis.  4. The aortic valve is normal in structure. Aortic valve regurgitation is not visualized. No aortic stenosis is present.  5. Aortic dilatation noted. There is borderline dilatation of the ascending aorta, measuring 38 mm.  6. The inferior vena cava is normal in size with greater than 50% respiratory variability, suggesting right atrial pressure of 3 mmHg. Conclusion(s)/Recommendation(s): No intracardiac source of embolism detected on this transthoracic study. A transesophageal echocardiogram is recommended to exclude cardiac source of embolism if clinically indicated. FINDINGS  Left Ventricle: Left ventricular ejection fraction, by estimation, is 65 to 70%. The left ventricle has normal function. The left ventricle has no regional wall motion abnormalities. The left ventricular internal cavity size was normal in size. There is  moderate left ventricular hypertrophy. Left ventricular diastolic parameters are consistent with Grade I diastolic dysfunction (impaired relaxation). Right Ventricle: The right ventricular size is normal. No increase in right ventricular wall thickness. Right ventricular systolic function is normal. There is normal pulmonary artery systolic pressure. The tricuspid regurgitant velocity is 2.14 m/s, and  with an assumed right atrial pressure of 3 mmHg, the estimated right ventricular systolic pressure is [id number] mmHg. Left Atrium: Left atrial size was normal in size. Right Atrium: Right atrial size was normal in size. Pericardium: There is no evidence of pericardial effusion. Mitral Valve: The mitral valve is normal in structure. Trivial mitral valve regurgitation. No evidence of mitral valve stenosis. MV peak gradient, 8.0 mmHg. The mean mitral valve gradient is 3.0 mmHg. Tricuspid Valve: The tricuspid valve is normal in structure. Tricuspid valve regurgitation is mild . No evidence of tricuspid stenosis. Aortic Valve: The aortic valve is normal in structure. Aortic valve  regurgitation is not visualized. No aortic stenosis is present. Aortic valve mean gradient measures 3.0 mmHg. Aortic valve peak gradient measures 6.2 mmHg. Aortic valve area, by VTI measures 3.55 cm. Pulmonic Valve: The pulmonic valve was thickened with good excursion. Pulmonic valve regurgitation is mild. No evidence of pulmonic stenosis. Aorta: Aortic dilatation noted. There is borderline dilatation of the ascending aorta, measuring 38 mm. Venous: The inferior vena cava is normal in size with greater than 50% respiratory  variability, suggesting right atrial pressure of 3 mmHg. IAS/Shunts: There is redundancy of the interatrial septum. No atrial level shunt detected by color flow Doppler.  LEFT VENTRICLE PLAX 2D LVIDd:         3.50 cm  Diastology LVIDs:         1.70 cm  LV e' medial:    7.40 cm/s LV PW:         1.50 cm  LV E/e' medial:  11.9 LV IVS:        1.50 cm  LV e' lateral:   9.36 cm/s LVOT diam:     2.20 cm  LV E/e' lateral: 9.4 LV SV:         95 LV SV Index:   52 LVOT Area:     3.80 cm  RIGHT VENTRICLE RV Basal diam:  4.30 cm RV Mid diam:    2.60 cm LEFT ATRIUM             Index       RIGHT ATRIUM           Index LA diam:        3.20 cm 1.75 cm/m  RA Area:     11.60 cm LA Vol (A2C):   24.9 ml 13.65 ml/m RA Volume:   26.10 ml  14.30 ml/m LA Vol (A4C):   33.7 ml 18.47 ml/m LA Biplane Vol: 30.6 ml 16.77 ml/m  AORTIC VALVE                   PULMONIC VALVE AV Area (Vmax):    3.25 cm    PV Vmax:       0.76 m/s AV Area (Vmean):   3.28 cm    PV Vmean:      50.700 cm/s AV Area (VTI):     3.55 cm    PV VTI:        0.159 m AV Vmax:           125.00 cm/s PV Peak grad:  2.3 mmHg AV Vmean:          83.600 cm/s PV Mean grad:  1.0 mmHg AV VTI:            0.269 m AV Peak Grad:      6.2 mmHg AV Mean Grad:      3.0 mmHg LVOT Vmax:         107.00 cm/s LVOT Vmean:        72.200 cm/s LVOT VTI:          0.251 m LVOT/AV VTI ratio: 0.93  AORTA Ao Root diam: 3.70 cm Ao Asc diam:  3.80 cm MITRAL VALVE                 TRICUSPID VALVE MV Area (PHT): 2.76 cm     TR Peak grad:   18.3 mmHg MV Area VTI:   2.07 cm     TR Vmax:        214.00 cm/s MV Peak grad:  8.0 mmHg MV Mean grad:  3.0 mmHg     SHUNTS MV Vmax:       1.41 m/s     Systemic VTI:  0.25 m MV Vmean:      80.8 cm/s    Systemic Diam: 2.20 cm MV Decel Time: 275 msec MV E velocity: 88.10 cm/s MV A velocity: 109.00 cm/s MV E/A ratio:  0.81 Cherlynn Kaiser MD Electronically signed by Cherlynn Kaiser MD Signature Date/Time: 12/17/2020/1:53:18  PM    Final    CT HEAD CODE STROKE WO CONTRAST  Result Date: 12/16/2020 CLINICAL DATA:  Code stroke.  Right-sided deficits EXAM: CT HEAD WITHOUT CONTRAST TECHNIQUE: Contiguous axial images were obtained from the base of the skull through the vertex without intravenous contrast. COMPARISON:  12/12/2020 FINDINGS: Brain: There is no mass, hemorrhage or extra-axial collection. The size and configuration of the ventricles and extra-axial CSF spaces are normal. Area of hypoattenuation in the inferior left frontal lobe, likely early subacute infarct. Small vessel infarcts in the left corona radiata are unchanged. Vascular: No abnormal hyperdensity of the major intracranial arteries or dural venous sinuses. No intracranial atherosclerosis. Skull: The visualized skull base, calvarium and extracranial soft tissues are normal. Sinuses/Orbits: No fluid levels or advanced mucosal thickening of the visualized paranasal sinuses. No mastoid or middle ear effusion. The orbits are normal. ASPECTS [facility] Stroke Program Early CT Score) - Ganglionic level infarction (caudate, lentiform nuclei, internal capsule, insula, M1-M3 cortex): 6 - Supraganglionic infarction (M4-M6 cortex): 3 Total score (0-10 with 10 being normal): 9 IMPRESSION: 1. No acute intracranial hemorrhage. 2. Suspected early subacute infarct of the inferior left frontal lobe. 3. ASPECTS is 9. These results were called by telephone at the time of interpretation on 12/16/2020 at 8:25 pm to  provider [facility] , who verbally acknowledged these results. Electronically Signed   By: Ulyses Jarred M.D.   On: 12/16/2020 20:26   CT ANGIO HEAD CODE STROKE  Result Date: 12/16/2020 CLINICAL DATA:  Right-sided deficits EXAM: CT ANGIOGRAPHY HEAD AND NECK CT PERFUSION BRAIN TECHNIQUE: Multidetector CT imaging of the head and neck was performed using the standard protocol during bolus administration of intravenous contrast. Multiplanar CT image reconstructions and MIPs were obtained to evaluate the vascular anatomy. Carotid stenosis measurements (when applicable) are obtained utilizing NASCET criteria, using the distal internal carotid diameter as the denominator. Multiphase CT imaging of the brain was performed following IV bolus contrast injection. Subsequent parametric perfusion maps were calculated using RAPID software. CONTRAST:  178mL OMNIPAQUE IOHEXOL 350 MG/ML SOLN COMPARISON:  None. FINDINGS: CTA NECK FINDINGS SKELETON: There is no bony spinal canal stenosis. No lytic or blastic lesion. OTHER NECK: Normal pharynx, larynx and major salivary glands. No cervical lymphadenopathy. Unremarkable thyroid gland. UPPER CHEST: No pneumothorax or pleural effusion. No nodules or masses. AORTIC ARCH: There is no calcific atherosclerosis of the aortic arch. There is no aneurysm, dissection or hemodynamically significant stenosis of the visualized portion of the aorta. Conventional 3 vessel aortic branching pattern. The visualized proximal subclavian arteries are widely patent. RIGHT CAROTID SYSTEM: Normal without aneurysm, dissection or stenosis. LEFT CAROTID SYSTEM: Normal without aneurysm, dissection or stenosis. VERTEBRAL ARTERIES: Left dominant configuration. Both origins are clearly patent. There is no dissection, occlusion or flow-limiting stenosis to the skull base (V1-V3 segments). CTA HEAD FINDINGS POSTERIOR CIRCULATION: --Vertebral arteries: Normal V4 segments. --Inferior cerebellar arteries: Normal.  --Basilar artery: Normal. --Superior cerebellar arteries: Normal. --Posterior cerebral arteries (PCA): Multifocal moderate-to-severe stenosis of the right P2 segment. Normal left. ANTERIOR CIRCULATION: --Intracranial internal carotid arteries: Normal. --Anterior cerebral arteries (ACA): Normal. Both A1 segments are present. Patent anterior communicating artery (a-comm). --Middle cerebral arteries (MCA): Complete occlusion of the left MCA at the proximal M1 segment with limited collateralization. Right MCA is normal. VENOUS SINUSES: As permitted by contrast timing, patent. ANATOMIC VARIANTS: None Review of the MIP images confirms the above findings. CT Brain Perfusion Findings: ASPECTS: 9 CBF (<30%) Volume: 53mL Perfusion (Tmax>6.0s) volume: 15mL Mismatch Volume: 198mL, likely  slightly overestimated due to small area of completed infarction seen on the noncontrast head CT. Infarction Location:Left MCA territory IMPRESSION: 1. 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 2. Large area of ischemic penumbra in the left MCA territory. 3. 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 Critical Value/emergent results were called by telephone at the time of interpretation on 12/16/2020 at 9:02 pm to provider Christ Kick , who verbally acknowledged these results. Aortic Atherosclerosis (ICD10-I70.0). Electronically Signed   By: Ulyses Jarred M.D.   On: 12/16/2020 21:08   CT ANGIO NECK CODE STROKE  Result Date: 12/16/2020 CLINICAL DATA:  Right-sided deficits EXAM: CT ANGIOGRAPHY HEAD AND NECK CT PERFUSION BRAIN TECHNIQUE: Multidetector CT imaging of the head and neck was performed using the standard protocol during bolus administration of intravenous contrast. Multiplanar CT image reconstructions and MIPs were obtained to evaluate the vascular anatomy. Carotid stenosis measurements (when applicable)  are obtained utilizing NASCET criteria, using the distal internal carotid diameter as the denominator. Multiphase CT imaging of the brain was performed following IV bolus contrast injection. Subsequent parametric perfusion maps were calculated using RAPID software. CONTRAST:  164mL OMNIPAQUE IOHEXOL 350 MG/ML SOLN COMPARISON:  None. FINDINGS: CTA NECK FINDINGS SKELETON: There is no bony spinal canal stenosis. No lytic or blastic lesion. OTHER NECK: Normal pharynx, larynx and major salivary glands. No cervical lymphadenopathy. Unremarkable thyroid gland. UPPER CHEST: No pneumothorax or pleural effusion. No nodules or masses. AORTIC ARCH: There is no calcific atherosclerosis of the aortic arch. There is no aneurysm, dissection or hemodynamically significant stenosis of the visualized portion of the aorta. Conventional 3 vessel aortic branching pattern. The visualized proximal subclavian arteries are widely patent. RIGHT CAROTID SYSTEM: Normal without aneurysm, dissection or stenosis. LEFT CAROTID SYSTEM: Normal without aneurysm, dissection or stenosis. VERTEBRAL ARTERIES: Left dominant configuration. Both origins are clearly patent. There is no dissection, occlusion or flow-limiting stenosis to the skull base (V1-V3 segments). CTA HEAD FINDINGS POSTERIOR CIRCULATION: --Vertebral arteries: Normal V4 segments. --Inferior cerebellar arteries: Normal. --Basilar artery: Normal. --Superior cerebellar arteries: Normal. --Posterior cerebral arteries (PCA): Multifocal moderate-to-severe stenosis of the right P2 segment. Normal left. ANTERIOR CIRCULATION: --Intracranial internal carotid arteries: Normal. --Anterior cerebral arteries (ACA): Normal. Both A1 segments are present. Patent anterior communicating artery (a-comm). --Middle cerebral arteries (MCA): Complete occlusion of the left MCA at the proximal M1 segment with limited collateralization. Right MCA is normal. VENOUS SINUSES: As permitted by contrast timing, patent.  ANATOMIC VARIANTS: None Review of the MIP images confirms the above findings. CT Brain Perfusion Findings: ASPECTS: 9 CBF (<30%) Volume: 67mL Perfusion (Tmax>6.0s) volume: 138mL Mismatch Volume: 154mL, likely slightly overestimated due to small area of completed infarction seen on the noncontrast head CT. Infarction Location:Left MCA territory IMPRESSION: 1. 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 2. Large area of ischemic penumbra in the left MCA territory. 3. 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 Critical Value/emergent results were called by telephone at the time of interpretation on 12/16/2020 at 9:02 pm to provider Christ Kick , who verbally acknowledged these results. Aortic Atherosclerosis (ICD10-I70.0). Electronically Signed   By: Ulyses Jarred M.D.   On: 12/16/2020 21:08    PHYSICAL EXAM  Temp:  [97.6 F (36.4 C)-98.5 F (36.9 C)] 98.2 F (36.8 C) (05/22 0800) Pulse Rate:  [55-89] 56 (05/22 0800) Resp:  [14-18] 16 (  05/22 0800) BP: (117-176)/(54-88) 157/62 (05/22 0800) SpO2:  [99 %-100 %] 100 % (05/22 0800)  General - Well nourished, well developed, not in acute distress, feeding himself breakfast really well.  Ophthalmologic - fundi not visualized due to noncooperation.  Cardiovascular - Regular rhythm and bradycardia.  Neuro - awake, alert, eyes open, oriented to people, age and self, but not to time.  Slightly nonfluent speech with occasional low paraphasic errors with mild perseveration, mild dysarthria, following all simple commands. Able to repeat simple sentences but complex sentences, able to name 3/3. No gaze palsy, tracking bilaterally, visual field full, PERRL. No facial droop. Tongue midline. LUE no drift, RUE pronator drift, but finger grip 4/5 bilaterally. BLE 3/5 proximal, distally left 5/5 and right 4/5. Sensation symmetrical bilaterally,  b/l FTN intact grossly, gait not tested.   ASSESSMENT/PLAN Mr. ONNIE BRENDA is a 55 y.o. male with history of seizure on Depakote and Lamictal, CAD status post CABG, diabetes, hypertension, hyperlipidemia, OSA, substance abuse admitted for seizure, right-sided weakness and slurred speech. No tPA given due to outside window.    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   CT repeat left MCA infarct with trace SAH  MRI scattered left MCA infarcts. small chronic cortical infarct within the right occipital lobe.  MRA left M1 patent, high-grade stenosis inferior left M2, moderate stenosis right M1, severe stenosis, bilateral P2.  2D Echo  EF 65-70%  LDL 116  HgbA1c 9.7  Lovenox for VTE prophylaxis  clopidogrel 75 mg daily prior to admission, now on Brilinta (ticagrelor) 90 mg bid post stent, patient has allergy to aspirin.  Patient counseled to be compliant with his antithrombotic medications   Possible pseudoanerysm at groin site: check ultrasound today, Hgb up to 10.5 and HCT 30 on 12 noon check.   Ongoing aggressive stroke risk factor management  Therapy recommendations: Pending, pt lives alone  Disposition: Pending  Seizure  History of seizure on Depakote and Lamictal  Recent breakthrough seizure  On Depakote 1500 twice daily PTA -> 1000 bid  lamictal 50 twice daily   EEG no seizure  Depakote level 39->150->52, continue depakote 1000mg  bid  depakote level daily  Diabetes Hypoglycemia 5/20  HgbA1c 9.7 goal < 7.0  Uncontrolled  Hypoglycemia x one on 5/20  CBG monitoring stable past 24 hours   SSI  DM coordinator consulted, appreciate recs  DM education and close PCP follow up  Hypertension . Stable . BP goal 120-140 within 24h of IR  Long term BP goal  130-150 given intracranial stenosis  Hyperlipidemia  Home meds: Lipitor 40  LDL 116, goal < 70  Now on Lipitor 80  Continue statin at discharge  Tobacco abuse  Current smoker  Smoking cessation counseling provided  Pt is willing to quit  Urine retention  Foley placed 5/20  Flomax on board  Consider voiding trial around 5/25  Other Stroke Risk Factors  UDS positive THC, cessation education provided  ETOH use  Hx stroke/TIA - old infarct on MRI  Coronary artery disease status post CABG  Obstructive sleep apnea, on CPAP at home  Other Active Problems  AKI on CKD IIa, Cre 1.33->2.15->2.20->1.75->1.74, continue IV fluid  I have personally obtained history,examined this patient, reviewed notes, independently viewed imaging studies, participated in medical decision making  and plan of care.ROS completed by me personally and pertinent positives fully documented  I have made any additions or clarifications directly to the above note. Agree with note above.  Continue valproic acid and the current dose for seizures.  Check right groin ultrasound to look for pseudoaneurysm due to falling hematocrit.  Repeat hematocrit at 12 noon today.  Long discussion with patient and answered questions.  Greater than 50% time during this 35-minute visit was spent in counseling and coordination of care and discussion with care team and answering questions.  Antony Contras, MD Medical Director [facility] Pager: [phone] 12/20/2020 11:39 AM   To contact Stroke Continuity provider, please refer to [email or web address] After hours, contact General Neurology

## 2020-12-20 NOTE — Progress Notes (Signed)
Inpatient Rehab Admissions Coordinator:  Informed to Neurology NP, Charlene Brooke that Dr. Leonie Man talked to pt regarding pursuing CIR.  Pt agreed.  Consult order received.  Attempted to contact pt's sister, [name] to confirm disposition.  Left message; awaiting return call.  Will continue to follow.   Gayland Curry, [place], [place] Admissions Coordinator [phone]

## 2020-12-20 NOTE — Progress Notes (Signed)
Inpatient Rehab Admissions Coordinator:  TOC informed AC that they were unable to persuade pt to pursue CIR.  Saw pt at bedside to verify that they do not wish to pursue CIR.  Pt indicated that he did not.  AC will sign off.   Gayland Curry, [place], [place] Admissions Coordinator [phone]

## 2020-12-21 ENCOUNTER — Other Ambulatory Visit: Payer: Self-pay

## 2020-12-21 ENCOUNTER — Inpatient Hospital Stay (HOSPITAL_COMMUNITY)
Admission: RE | Admit: 2020-12-21 | Discharge: 2020-12-30 | DRG: 057 | Disposition: A | Discharge status: Home Health | Payer: Medicaid Other | Source: Intra-hospital | Attending: Physical Medicine & Rehabilitation | Admitting: Physical Medicine & Rehabilitation

## 2020-12-21 DIAGNOSIS — Z951 Presence of aortocoronary bypass graft: Secondary | ICD-10-CM

## 2020-12-21 DIAGNOSIS — F1210 Cannabis abuse, uncomplicated: Secondary | ICD-10-CM | POA: Diagnosis present

## 2020-12-21 DIAGNOSIS — N189 Chronic kidney disease, unspecified: Secondary | ICD-10-CM | POA: Diagnosis present

## 2020-12-21 DIAGNOSIS — E1122 Type 2 diabetes mellitus with diabetic chronic kidney disease: Secondary | ICD-10-CM | POA: Diagnosis present

## 2020-12-21 DIAGNOSIS — R5383 Other fatigue: Secondary | ICD-10-CM

## 2020-12-21 DIAGNOSIS — E782 Mixed hyperlipidemia: Secondary | ICD-10-CM | POA: Diagnosis present

## 2020-12-21 DIAGNOSIS — Z79899 Other long term (current) drug therapy: Secondary | ICD-10-CM

## 2020-12-21 DIAGNOSIS — F17210 Nicotine dependence, cigarettes, uncomplicated: Secondary | ICD-10-CM | POA: Diagnosis present

## 2020-12-21 DIAGNOSIS — Z981 Arthrodesis status: Secondary | ICD-10-CM

## 2020-12-21 DIAGNOSIS — I129 Hypertensive chronic kidney disease with stage 1 through stage 4 chronic kidney disease, or unspecified chronic kidney disease: Secondary | ICD-10-CM | POA: Diagnosis present

## 2020-12-21 DIAGNOSIS — Z833 Family history of diabetes mellitus: Secondary | ICD-10-CM | POA: Diagnosis not present

## 2020-12-21 DIAGNOSIS — G40909 Epilepsy, unspecified, not intractable, without status epilepticus: Secondary | ICD-10-CM | POA: Diagnosis present

## 2020-12-21 DIAGNOSIS — I252 Old myocardial infarction: Secondary | ICD-10-CM | POA: Diagnosis not present

## 2020-12-21 DIAGNOSIS — I63512 Cerebral infarction due to unspecified occlusion or stenosis of left middle cerebral artery: Secondary | ICD-10-CM | POA: Diagnosis present

## 2020-12-21 DIAGNOSIS — F209 Schizophrenia, unspecified: Secondary | ICD-10-CM | POA: Diagnosis not present

## 2020-12-21 DIAGNOSIS — I69351 Hemiplegia and hemiparesis following cerebral infarction affecting right dominant side: Secondary | ICD-10-CM | POA: Diagnosis present

## 2020-12-21 DIAGNOSIS — D61818 Other pancytopenia: Secondary | ICD-10-CM | POA: Diagnosis present

## 2020-12-21 DIAGNOSIS — N179 Acute kidney failure, unspecified: Secondary | ICD-10-CM | POA: Diagnosis present

## 2020-12-21 DIAGNOSIS — E119 Type 2 diabetes mellitus without complications: Secondary | ICD-10-CM

## 2020-12-21 DIAGNOSIS — I10 Essential (primary) hypertension: Secondary | ICD-10-CM | POA: Diagnosis not present

## 2020-12-21 DIAGNOSIS — I2510 Atherosclerotic heart disease of native coronary artery without angina pectoris: Secondary | ICD-10-CM | POA: Diagnosis present

## 2020-12-21 DIAGNOSIS — Z794 Long term (current) use of insulin: Secondary | ICD-10-CM | POA: Diagnosis not present

## 2020-12-21 DIAGNOSIS — Z7902 Long term (current) use of antithrombotics/antiplatelets: Secondary | ICD-10-CM

## 2020-12-21 DIAGNOSIS — E785 Hyperlipidemia, unspecified: Secondary | ICD-10-CM | POA: Diagnosis present

## 2020-12-21 DIAGNOSIS — Z23 Encounter for immunization: Secondary | ICD-10-CM | POA: Diagnosis not present

## 2020-12-21 DIAGNOSIS — E1159 Type 2 diabetes mellitus with other circulatory complications: Secondary | ICD-10-CM | POA: Diagnosis present

## 2020-12-21 DIAGNOSIS — F319 Bipolar disorder, unspecified: Secondary | ICD-10-CM | POA: Diagnosis present

## 2020-12-21 DIAGNOSIS — I69320 Aphasia following cerebral infarction: Secondary | ICD-10-CM | POA: Diagnosis not present

## 2020-12-21 HISTORY — DX: Cerebral infarction due to unspecified occlusion or stenosis of left middle cerebral artery: I63.512

## 2020-12-21 LAB — CBC WITH DIFFERENTIAL/PLATELET
Abs Immature Granulocytes: 0.01 10*3/uL (ref 0.00–0.07)
Basophils Absolute: 0 10*3/uL (ref 0.0–0.1)
Basophils Relative: 0 %
Eosinophils Absolute: 0 10*3/uL (ref 0.0–0.5)
Eosinophils Relative: 1 %
HCT: 28.7 % — ABNORMAL LOW (ref 39.0–52.0)
Hemoglobin: 10.1 g/dL — ABNORMAL LOW (ref 13.0–17.0)
Immature Granulocytes: 0 %
Lymphocytes Relative: 17 %
Lymphs Abs: 0.5 10*3/uL — ABNORMAL LOW (ref 0.7–4.0)
MCH: 31.1 pg (ref 26.0–34.0)
MCHC: 35.2 g/dL (ref 30.0–36.0)
MCV: 88.3 fL (ref 80.0–100.0)
Monocytes Absolute: 0.6 10*3/uL (ref 0.1–1.0)
Monocytes Relative: 19 %
Neutro Abs: 1.8 10*3/uL (ref 1.7–7.7)
Neutrophils Relative %: 63 %
Platelets: 114 10*3/uL — ABNORMAL LOW (ref 150–400)
RBC: 3.25 MIL/uL — ABNORMAL LOW (ref 4.22–5.81)
RDW: 12.1 % (ref 11.5–15.5)
WBC: 2.9 10*3/uL — ABNORMAL LOW (ref 4.0–10.5)
nRBC: 0 % (ref 0.0–0.2)

## 2020-12-21 LAB — BASIC METABOLIC PANEL
Anion gap: 5 (ref 5–15)
BUN: 16 mg/dL (ref 6–20)
CO2: 23 mmol/L (ref 22–32)
Calcium: 8.3 mg/dL — ABNORMAL LOW (ref 8.9–10.3)
Chloride: 108 mmol/L (ref 98–111)
Creatinine, Ser: 1.39 mg/dL — ABNORMAL HIGH (ref 0.61–1.24)
GFR, Estimated: 59 mL/min — ABNORMAL LOW (ref >60)
Glucose, Bld: 143 mg/dL — ABNORMAL HIGH (ref 70–99)
Potassium: 4.2 mmol/L (ref 3.5–5.1)
Sodium: 136 mmol/L (ref 135–145)

## 2020-12-21 LAB — GLUCOSE, CAPILLARY
Glucose-Capillary: 145 mg/dL — ABNORMAL HIGH (ref 70–99)
Glucose-Capillary: 134 mg/dL — ABNORMAL HIGH (ref 70–99)
Glucose-Capillary: 105 mg/dL — ABNORMAL HIGH (ref 70–99)
Glucose-Capillary: 120 mg/dL — ABNORMAL HIGH (ref 70–99)
Glucose-Capillary: 141 mg/dL — ABNORMAL HIGH (ref 70–99)

## 2020-12-21 LAB — VALPROIC ACID LEVEL: Valproic Acid Lvl: 54 ug/mL (ref 50.0–100.0)

## 2020-12-21 MED ORDER — INSULIN ASPART 100 UNIT/ML IJ SOLN
0.0000 [IU] | Freq: Three times a day (TID) | INTRAMUSCULAR | Status: DC
  Administered 2020-12-21: 1 [IU] via SUBCUTANEOUS
  Administered 2020-12-22 (×2): 2 [IU] via SUBCUTANEOUS
  Administered 2020-12-22: 3 [IU] via SUBCUTANEOUS
  Administered 2020-12-23: 2 [IU] via SUBCUTANEOUS
  Administered 2020-12-23: 1 [IU] via SUBCUTANEOUS
  Administered 2020-12-23 – 2020-12-25 (×4): 2 [IU] via SUBCUTANEOUS
  Administered 2020-12-25: 3 [IU] via SUBCUTANEOUS
  Administered 2020-12-25: 2 [IU] via SUBCUTANEOUS
  Administered 2020-12-26: 3 [IU] via SUBCUTANEOUS
  Administered 2020-12-26: 1 [IU] via SUBCUTANEOUS
  Administered 2020-12-26: 3 [IU] via SUBCUTANEOUS
  Administered 2020-12-26: 1 [IU] via SUBCUTANEOUS
  Administered 2020-12-27: 2 [IU] via SUBCUTANEOUS
  Administered 2020-12-27 (×2): 1 [IU] via SUBCUTANEOUS
  Administered 2020-12-28 (×2): 2 [IU] via SUBCUTANEOUS
  Administered 2020-12-29: 5 [IU] via SUBCUTANEOUS
  Administered 2020-12-29: 1 [IU] via SUBCUTANEOUS
  Administered 2020-12-29: 2 [IU] via SUBCUTANEOUS

## 2020-12-21 MED ORDER — ALBUTEROL SULFATE (2.5 MG/3ML) 0.083% IN NEBU: 2.5000 mg | INHALATION_SOLUTION | RESPIRATORY_TRACT | Status: DC | PRN

## 2020-12-21 MED ORDER — ADULT MULTIVITAMIN W/MINERALS CH
1.0000 | ORAL_TABLET | Freq: Every day | ORAL | Status: DC
  Administered 2020-12-22 – 2020-12-30 (×9): 1 via ORAL
  Filled 2020-12-21 (×9): qty 1

## 2020-12-21 MED ORDER — SENNOSIDES-DOCUSATE SODIUM 8.6-50 MG PO TABS: 1.0000 | ORAL_TABLET | Freq: Every evening | ORAL | Status: DC | PRN

## 2020-12-21 MED ORDER — AMLODIPINE BESYLATE 10 MG PO TABS
10.0000 mg | ORAL_TABLET | Freq: Every day | ORAL | Status: DC
  Administered 2020-12-22 – 2020-12-30 (×9): 10 mg via ORAL
  Filled 2020-12-21 (×9): qty 1

## 2020-12-21 MED ORDER — ENOXAPARIN SODIUM 40 MG/0.4ML IJ SOSY
40.0000 mg | PREFILLED_SYRINGE | INTRAMUSCULAR | Status: DC
  Administered 2020-12-22 – 2020-12-30 (×9): 40 mg via SUBCUTANEOUS
  Filled 2020-12-21 (×9): qty 0.4

## 2020-12-21 MED ORDER — PNEUMOCOCCAL VAC POLYVALENT 25 MCG/0.5ML IJ INJ
0.5000 mL | INJECTION | INTRAMUSCULAR | Status: AC
  Administered 2020-12-22: 0.5 mL via INTRAMUSCULAR
  Filled 2020-12-21: qty 0.5

## 2020-12-21 MED ORDER — ATORVASTATIN CALCIUM 80 MG PO TABS
80.0000 mg | ORAL_TABLET | Freq: Every day | ORAL | Status: DC
  Administered 2020-12-21 – 2020-12-29 (×9): 80 mg via ORAL
  Filled 2020-12-21 (×9): qty 1

## 2020-12-21 MED ORDER — HYDROCHLOROTHIAZIDE 25 MG PO TABS
25.0000 mg | ORAL_TABLET | Freq: Every day | ORAL | Status: DC
  Administered 2020-12-22 – 2020-12-30 (×9): 25 mg via ORAL
  Filled 2020-12-21 (×9): qty 1

## 2020-12-21 MED ORDER — TICAGRELOR 90 MG PO TABS
90.0000 mg | ORAL_TABLET | Freq: Two times a day (BID) | ORAL | Status: DC
  Administered 2020-12-21 – 2020-12-30 (×18): 90 mg via ORAL
  Filled 2020-12-21 (×18): qty 1

## 2020-12-21 MED ORDER — ENSURE ENLIVE PO LIQD: 237.0000 mL | Freq: Three times a day (TID) | ORAL | 12 refills | Status: DC

## 2020-12-21 MED ORDER — ENOXAPARIN SODIUM 40 MG/0.4ML IJ SOSY: 40.0000 mg | PREFILLED_SYRINGE | INTRAMUSCULAR | Status: DC

## 2020-12-21 MED ORDER — PANTOPRAZOLE SODIUM 40 MG PO TBEC
40.0000 mg | DELAYED_RELEASE_TABLET | Freq: Every day | ORAL | Status: DC
  Administered 2020-12-22 – 2020-12-30 (×9): 40 mg via ORAL
  Filled 2020-12-21 (×9): qty 1

## 2020-12-21 MED ORDER — ISOSORBIDE MONONITRATE ER 30 MG PO TB24
30.0000 mg | ORAL_TABLET | Freq: Every day | ORAL | Status: DC
  Administered 2020-12-22 – 2020-12-30 (×9): 30 mg via ORAL
  Filled 2020-12-21 (×9): qty 1

## 2020-12-21 MED ORDER — ADULT MULTIVITAMIN W/MINERALS CH: 1.0000 | ORAL_TABLET | Freq: Every day | ORAL | Status: DC

## 2020-12-21 MED ORDER — DIVALPROEX SODIUM 500 MG PO DR TAB: 1000.0000 mg | DELAYED_RELEASE_TABLET | Freq: Two times a day (BID) | ORAL | Status: DC

## 2020-12-21 MED ORDER — LAMOTRIGINE 100 MG PO TABS
50.0000 mg | ORAL_TABLET | Freq: Two times a day (BID) | ORAL | Status: DC
  Administered 2020-12-21 – 2020-12-30 (×17): 50 mg via ORAL
  Filled 2020-12-21 (×18): qty 1

## 2020-12-21 MED ORDER — ACETAMINOPHEN 325 MG PO TABS
650.0000 mg | ORAL_TABLET | ORAL | Status: DC | PRN
  Filled 2020-12-21: qty 2

## 2020-12-21 MED ORDER — ATORVASTATIN CALCIUM 80 MG PO TABS: 80.0000 mg | ORAL_TABLET | Freq: Every day | ORAL | Status: DC

## 2020-12-21 MED ORDER — PANTOPRAZOLE SODIUM 40 MG PO TBEC: 40.0000 mg | DELAYED_RELEASE_TABLET | Freq: Every day | ORAL | Status: DC

## 2020-12-21 MED ORDER — TICAGRELOR 90 MG PO TABS
90.0000 mg | ORAL_TABLET | Freq: Two times a day (BID) | ORAL | Status: DC
  Filled 2020-12-21 (×9): qty 1

## 2020-12-21 MED ORDER — HYDROCHLOROTHIAZIDE 25 MG PO TABS: 25.0000 mg | ORAL_TABLET | Freq: Every day | ORAL | Status: DC

## 2020-12-21 MED ORDER — INSULIN ASPART 100 UNIT/ML IJ SOLN: 0.0000 [IU] | Freq: Three times a day (TID) | INTRAMUSCULAR | 11 refills | Status: DC

## 2020-12-21 MED ORDER — ACETAMINOPHEN 325 MG PO TABS: 650.0000 mg | ORAL_TABLET | ORAL | Status: DC | PRN

## 2020-12-21 MED ORDER — ACETAMINOPHEN 650 MG RE SUPP: 650.0000 mg | RECTAL | Status: DC | PRN

## 2020-12-21 MED ORDER — ENSURE ENLIVE PO LIQD
237.0000 mL | Freq: Three times a day (TID) | ORAL | Status: DC
  Administered 2020-12-21 – 2020-12-30 (×22): 237 mL via ORAL

## 2020-12-21 MED ORDER — DIVALPROEX SODIUM 500 MG PO DR TAB
1000.0000 mg | DELAYED_RELEASE_TABLET | Freq: Two times a day (BID) | ORAL | Status: DC
  Administered 2020-12-21 – 2020-12-30 (×18): 1000 mg via ORAL
  Filled 2020-12-21 (×19): qty 2

## 2020-12-21 MED ORDER — PALIPERIDONE ER 6 MG PO TB24: 12.0000 mg | ORAL_TABLET | Freq: Every day | ORAL | Status: DC

## 2020-12-21 MED ORDER — MELATONIN 5 MG PO TABS
10.0000 mg | ORAL_TABLET | Freq: Every evening | ORAL | Status: DC | PRN
  Filled 2020-12-21: qty 2

## 2020-12-21 MED ORDER — ACETAMINOPHEN 160 MG/5ML PO SOLN: 650.0000 mg | ORAL | Status: DC | PRN

## 2020-12-21 MED ORDER — ESCITALOPRAM OXALATE 10 MG PO TABS
20.0000 mg | ORAL_TABLET | Freq: Every day | ORAL | Status: DC
  Administered 2020-12-22 – 2020-12-30 (×9): 20 mg via ORAL
  Filled 2020-12-21 (×11): qty 2

## 2020-12-21 MED ORDER — TICAGRELOR 90 MG PO TABS: 90.0000 mg | ORAL_TABLET | Freq: Two times a day (BID) | ORAL | Status: DC

## 2020-12-21 MED ORDER — LANTUS SOLOSTAR 100 UNIT/ML ~~LOC~~ SOPN: 40.0000 [IU] | PEN_INJECTOR | Freq: Every day | SUBCUTANEOUS | 0 refills | Status: DC

## 2020-12-21 MED ORDER — BLOOD PRESSURE CONTROL BOOK
Freq: Once | Status: AC
  Filled 2020-12-21: qty 1

## 2020-12-21 MED ORDER — LIVING WELL WITH DIABETES BOOK
Freq: Once | Status: AC
  Filled 2020-12-21: qty 1

## 2020-12-21 NOTE — Progress Notes (Signed)
Inpatient Rehabilitation Medication Review by a Pharmacist  A complete drug regimen review was completed for this patient to identify any potential clinically significant medication issues.  Clinically significant medication issues were identified:  no  Check AMION for pharmacist assigned to patient if future medication questions/issues arise during this admission.  Pharmacist comments:   Time spent performing this drug regimen review (minutes):  20   Hitoshi Werts 12/21/2020 6:55 PM

## 2020-12-21 NOTE — Progress Notes (Signed)
Patient Blood pressure little elevated upon arrival . PA notified. Idamae Schuller, LPN

## 2020-12-21 NOTE — Progress Notes (Signed)
Inpatient Rehab Admissions Coordinator:   I spoke with the patient regarding CIR admission again today. He is now interested and I have a bed for him today. I spoke with his aunt, Charolette Child, who reports that pt. Has a home health aide who comes for 8 hours 6 days a week and she states that she and Pt.'s sister and other family can provide support in the evenings. RN may call report to [phone] after 12pm.  Clemens Catholic, [place], [place] Admissions Coordinator  [phone] (celll) [phone] (office)

## 2020-12-21 NOTE — Progress Notes (Signed)
Patient arrived at 28. Patient arrived in wheelchair. A&Ox3. Patient denied pain. assement completed. Vitals are stable. Patient educated on safety plan and plan of care. Will continue to monitor. Idamae Schuller, LPN

## 2020-12-21 NOTE — Progress Notes (Signed)
Inpatient Rehabilitation  Patient information reviewed and entered into eRehab system by [place] Madole M. Isabele Lollar, M.A., CCC/SLP, PPS Coordinator.  Information including medical coding, functional ability and quality indicators will be reviewed and updated through discharge.    

## 2020-12-21 NOTE — Progress Notes (Signed)
PMR Admission Coordinator Pre-Admission Assessment  Patient: Melvin Taylor is an 55 y.o., male MRN: [id number] DOB: [birth year] Height: 5\' 7"  (170.2 cm) Weight: 61.3 kg  Insurance Information HMO:     PPO:      PCP:      IPA:      80/20:      OTHER:   PRIMARY: Medicaid [place] AccessPolicy#Subscriber: pt Benefits: Phone #: passport one online 5/23 Eff. Date:active 5/12Out of Pocket Max: Life Max:  CIR:[place] surgeon: Phone#:   SECONDARY: none  Development worker, community:       Phone#:   The Engineer, petroleum" for patients in Inpatient Rehabilitation Facilities with attached "Privacy Act [place] Records" was provided and verbally reviewed with: Family  Emergency Contact Information         Contact Information    Name Relation Home Work Mobile   perrigram,lisa Sister   [phone]   gant,betty Aunt   [phone]      Current Medical History  Patient Admitting Diagnosis: CVA History of Present Illness: Melvin Taylor a 55 y.o.right-handed malewith history of hypertension, hyperlipidemia, CAD with myocardial infarction maintained on Plavix, epilepsy/seizures, schizophrenia, diabetes mellitus, tobacco as well as polysubstance abuse question medical compliance. Per chart review patient lives alone. 1 level apartment. Has a PCA 4 hours a day 6 days a week. Independent with assistive device. Presented 12/16/2020 with right side weakness and speech difficulties. CT/MRI showed scattered acute infarcts within the left MCA and MCA/PCA watershed territories affecting the cortical/subcortical left frontal parietal and occipital lobes. Multiple acute infarcts also present within the left insula and basal ganglia. No significant mass-effect. No hemorrhagic conversion. MRA demonstrated moderate stenosis within the proximal M1 right MCA. Severe stenosis within the  P2 and right PCA. Patient underwent mechanical thrombectomy per interventional radiology. Echocardiogram with ejection fraction of 65 to 67% grade 1 diastolic dysfunction. EEG negative for seizure. Urine drug screen positive marijuana. Neurology follow-up work-up presently ongoing. Maintained on Lovenox for DVT prophylaxis.Therapy evaluations completed with recommendations of physical medicine rehab consult due to right side weakness and aphasia  Complete NIHSS TOTAL: 3  Patient's medical record from [facility] has been reviewed by the rehabilitation admission coordinator and physician.  Past Medical History      Past Medical History:  Diagnosis Date  . Anxiety   . Bipolar 1 disorder ([place])   . Bone spur    left heel  . Cervical radiculopathy   . Chest pain   . Chronic back pain   . Chronic chest wall pain   . Chronic neck pain   . Coronary artery disease    a. s/p CABG in 03/2016 with LIMA-LAD, SVG-D1, SVG-RCA, and Seq SVG-mid and distal OM  . Depression   . Diabetes mellitus    Type II  . Hallucinations    "long history of them"  . Headache(784.0)   . History of gout   . HTN (hypertension)   . Insomnia   . Myocardial infarction ([place]) 2017  . Neuropathy   . Pain management   . Polysubstance abuse ([place])   . Right leg pain    chronic  . Schizophrenia ([place])   . Seizures ([place])    last sz between July 5-9th, 2016; epilepsy  . Shortness of breath dyspnea    with exertion  . Sleep apnea   . Transfusion of blood product refused for religious reason     Family History   family history includes Arthritis  in an other family member; Asthma in an other family member; Diabetes in his father and another family member; Hypertension in his father; Stroke in his sister.  Prior Rehab/Hospitalizations Has the patient had prior rehab or hospitalizations prior to admission? Yes  Has the patient had major surgery during 100  days prior to admission? Yes             Current Medications  Current Facility-Administered Medications:  .   stroke: mapping our early stages of recovery book, , Does not apply, Once, Bhagat, Srishti L, MD .  0.9 %  sodium chloride infusion, , Intravenous, Continuous, Garvin Fila, MD, Last Rate: 60 mL/hr at 12/21/20 0400, Infusion Verify at 12/21/20 0400 .  acetaminophen (TYLENOL) tablet 650 mg, 650 mg, Oral, Q4H PRN **OR** acetaminophen (TYLENOL) 160 MG/5ML solution 650 mg, 650 mg, Per Tube, Q4H PRN **OR** acetaminophen (TYLENOL) suppository 650 mg, 650 mg, Rectal, Q4H PRN, Deveshwar, Sanjeev, MD .  albuterol (PROVENTIL) (2.5 MG/3ML) 0.083% nebulizer solution 2.5 mg, 2.5 mg, Nebulization, Q4H PRN, Bhagat, Srishti L, MD .  amLODipine (NORVASC) tablet 10 mg, 10 mg, Oral, Daily, Jennelle Human B, NP, 10 mg at 12/21/20 0846 .  atorvastatin (LIPITOR) tablet 80 mg, 80 mg, Oral, QHS, Rosalin Hawking, MD, 80 mg at 12/20/20 2144 .  divalproex (DEPAKOTE) DR tablet 1,000 mg, 1,000 mg, Oral, Q12H, Rosalin Hawking, MD, 1,000 mg at 12/21/20 0846 .  enoxaparin (LOVENOX) injection 40 mg, 40 mg, Subcutaneous, Q24H, Rosalin Hawking, MD, 40 mg at 12/20/20 1227 .  escitalopram (LEXAPRO) tablet 20 mg, 20 mg, Oral, Daily, Rosalin Hawking, MD, 20 mg at 12/21/20 0846 .  feeding supplement (ENSURE ENLIVE / ENSURE PLUS) liquid 237 mL, 237 mL, Oral, TID BM, Simpson, Paula B, NP, 237 mL at 12/21/20 0851 .  hydrALAZINE (APRESOLINE) injection 5-10 mg, 5-10 mg, Intravenous, Q2H PRN, Rosalin Hawking, MD, 10 mg at 12/21/20 0540 .  hydrochlorothiazide (HYDRODIURIL) tablet 25 mg, 25 mg, Oral, Daily, Bailey-Modzik, Delila A, NP, 25 mg at 12/21/20 0847 .  insulin aspart (novoLOG) injection 0-9 Units, 0-9 Units, Subcutaneous, TID WC & HS, Rosalin Hawking, MD, 1 Units at 12/21/20 [phone] .  isosorbide mononitrate (IMDUR) 24 hr tablet 30 mg, 30 mg, Oral, Daily, Jennelle Human B, NP, 30 mg at 12/21/20 0845 .  lamoTRIgine (LAMICTAL) tablet 50 mg, 50 mg,  Oral, BID, Rosalin Hawking, MD, 50 mg at 12/21/20 0845 .  melatonin tablet 10 mg, 10 mg, Oral, QHS PRN, Bhagat, Srishti L, MD .  multivitamin with minerals tablet 1 tablet, 1 tablet, Oral, Daily, Jennelle Human B, NP, 1 tablet at 12/21/20 0848 .  pantoprazole (PROTONIX) EC tablet 40 mg, 40 mg, Oral, Daily, Rosalin Hawking, MD, 40 mg at 12/21/20 0847 .  senna-docusate (Senokot-S) tablet 1 tablet, 1 tablet, Oral, QHS PRN, Bhagat, Srishti L, MD .  ticagrelor (BRILINTA) tablet 90 mg, 90 mg, Oral, BID, 90 mg at 12/21/20 0847 **OR** ticagrelor (BRILINTA) tablet 90 mg, 90 mg, Per Tube, BID, Jennelle Human B, NP, 90 mg at 12/17/20 [id number]  Patients Current Diet:     Diet Order                  Diet heart healthy/carb modified Room service appropriate? Yes with Assist; Fluid consistency: Thin  Diet effective now                  Precautions / Restrictions Precautions Precautions: Fall Precaution Comments: R side inattention Restrictions Weight Bearing Restrictions: No   Has  the patient had 2 or more falls or a fall with injury in the past year? No  Prior Activity Level Limited Community (1-2x/wk): Pt. went out mostly for appointments  Prior Functional Level Self Care: Did the patient need help bathing, dressing, using the toilet or eating? Independent  Indoor Mobility: Did the patient need assistance with walking from room to room (with or without device)? Independent  Stairs: Did the patient need assistance with internal or external stairs (with or without device)? Independent  Functional Cognition: Did the patient need help planning regular tasks such as shopping or remembering to take medications? Needed some help  Home Assistive Devices / [place] Devices/Equipment: Blood pressure cuff,Eyeglasses,CBG Meter,Shower chair with back,Cane (specify quad or straight) Home Equipment: Walker - 2 wheels  Prior Device Use: Indicate devices/aids used by the patient  prior to current illness, exacerbation or injury? None of the above  Current Functional Level Cognition  Arousal/Alertness: Awake/alert Overall Cognitive Status: Impaired/Different from baseline Current Attention Level: Selective Orientation Level: Oriented to person,Oriented to place,Oriented to situation Following Commands: Follows one step commands with increased time Safety/Judgement: Decreased awareness of safety,Decreased awareness of deficits General Comments: Pt with poor awareness into his deficits and thus safety, needing cues to attend to R side at times. Pt impulsive and forgetful of task at hand. Attention: Sustained Sustained Attention: Impaired Sustained Attention Impairment: Verbal basic Memory: Impaired Memory Impairment: Decreased recall of new information Awareness: Impaired Awareness Impairment: Intellectual impairment,Emergent impairment,Anticipatory impairment Problem Solving: Impaired Problem Solving Impairment: Verbal basic,Functional basic Safety/Judgment: Impaired    Extremity Assessment (includes Sensation/Coordination)  Upper Extremity Assessment: Defer to OT evaluation RUE Deficits / Details: appears to demonstrate apparent sensory motor deficits; using funcitonally but decreased fine-motor and in-hand manipulation skills RUE Sensation: decreased light touch (unablet o distinguish correct location when touched) RUE Coordination: decreased fine motor,decreased gross motor  Lower Extremity Assessment: RLE deficits/detail RLE Deficits / Details: Generalized weakness of grossly 4 to 4+ with MMT compared to 4+ to 5 on L; Decreased proprioception and sensation noted with mobility; decreased coordination with dysdiadochokinesia and possible dysmetria noted RLE Sensation: decreased proprioception (noted with mobility; was trying to rub shin without heel touching and did not notice, demonstrating impaired sensation) RLE Coordination: decreased gross  motor,decreased fine motor (dysdiadochokinesia and possible dysmetria noted)    ADLs  Overall ADL's : Needs assistance/impaired Eating/Feeding: Set up,Sitting Grooming: Set up,Sitting Upper Body Bathing: Set up,Sitting Lower Body Bathing: Minimal assistance,Sit to/from stand Upper Body Dressing : Minimal assistance,Sitting Lower Body Dressing: Minimal assistance,Sit to/from stand Toilet Transfer: Minimal assistance,+2 for safety/equipment,RW Toileting- Clothing Manipulation and Hygiene: Total assistance Toileting - Clothing Manipulation Details (indicate cue type and reason): condom cath Functional mobility during ADLs: Minimal assistance,+2 for safety/equipment,Rolling walker,Cueing for safety    Mobility  Overal bed mobility: Needs Assistance Bed Mobility: Supine to Sit Supine to sit: Min guard,Min assist General bed mobility comments: Grossly min guard with 1 instance of min assist for anterior lean of trunk to initiate scooting out fully to EOB.    Transfers  Overall transfer level: Needs assistance Equipment used: Rolling walker (2 wheeled) Transfers: Sit to/from Stand Sit to Stand: Min assist Stand pivot transfers: Min assist,+2 safety/equipment General transfer comment: Steadying assist required during power up to full standing. Increased time to gain/maintain standing balance and decreased awareness of safety noted as pt attempting to initiate gait training before he had gained balance.    Ambulation / Gait / Stairs / Wheelchair Mobility  Ambulation/Gait Ambulation/Gait  assistance: Min Web designer (Feet): 200 Feet Assistive device: Rolling walker (2 wheeled) Gait Pattern/deviations: Step-through pattern,Decreased step length - right,Decreased stride length,Decreased dorsiflexion - right,Trunk flexed General Gait Details: Several instances of running into objects on the R. Pt especially catching the walker on door frame and obstacles in the hallway. VC's  throughout to attend to the R side and avoid obstacles, however pt unable to make corrective changes even with cues to avoid objects with the walker. Gait velocity: Decreased Gait velocity interpretation: <1.8 ft/sec, indicate of risk for recurrent falls    Posture / Balance Balance Overall balance assessment: Needs assistance Sitting-balance support: No upper extremity supported,Feet supported Sitting balance-Leahy Scale: Good Standing balance support: Bilateral upper extremity supported Standing balance-Leahy Scale: Poor Standing balance comment: UE support on RW.    Special needs/care consideration Skin: Abrasion to the right hand and knee and Diabetic management  sQ Novolog 3x daily with meals   Previous Home Environment (from acute therapy documentation) Living Arrangements: Alone  Lives With: Alone Available Help at Discharge: Family,Personal care attendant (PCA 4 hrs/day 6 days/wk) Type of Home: Apartment Home Layout: One level Home Access: Stairs to enter Entrance Stairs-Rails: Right Entrance Stairs-Number of Steps: 3 Bathroom Shower/Tub: Optometrist: Yes How Accessible: Accessible via walker [place]: Yes Type of Home Care Services: Homehealth aide (personal care assistance that comes 6 days/week, 4 hours/day)  Discharge Living Setting Plans for Discharge Living Setting: Patient's home Type of Home at Discharge: Apartment Discharge Home Layout: One level Discharge Home Access: Stairs to enter Entrance Stairs-Rails: Right Entrance Stairs-Number of Steps: 3 Discharge Bathroom Shower/Tub: Tub/shower unit Discharge Bathroom Toilet: Standard Discharge Bathroom Accessibility: Yes How Accessible: Accessible via walker Does the patient have any problems obtaining your medications?: No  Social/Family/Support Systems Patient Roles: Other (Comment) Contact Information: [phone] Anticipated Caregiver:  Charolette Child (aunt) confirmed that Pt. Has a aide in the mornings 6 days a week and states that she and pt.'s sister, [name], can provide assistance in the afternoons and weekends when an aide is not available. I was not able to reach sister, [name], but she confirmed with TOC that she wants Pt. To come [facility] Anticipated Caregiver's Contact Information: [phone] Caregiver Availability: 24/7 Discharge Plan Discussed with Primary Caregiver: Yes Is Caregiver In Agreement with Plan?: Yes  Goals Patient/Family Goal for Rehab: PT/OT Mod I; SLP Supervision Expected length of stay: 5-7 days Pt/Family Agrees to Admission and willing to participate: Yes Program Orientation Provided & Reviewed with Pt/Caregiver Including Roles  & Responsibilities: Yes  Decrease burden of Care through IP rehab admission: Specialzed equipment needs, Decrease number of caregivers, Bowel and bladder program and Patient/family education  Possible need for SNF placement upon discharge: not anticipated  Patient Condition: I have reviewed medical records from [facility], spoken with CM, and patient and family member. I discussed via phone for inpatient rehabilitation assessment.  Patient will benefit from ongoing PT, OT and SLP, can actively participate in 3 hours of therapy a day 5 days of the week, and can make measurable gains during the admission.  Patient will also benefit from the coordinated team approach during an Inpatient Acute Rehabilitation admission.  The patient will receive intensive therapy as well as Rehabilitation physician, nursing, social worker, and care management interventions.  Due to safety, skin/wound care, disease management, medication administration, pain management and patient education the patient requires 24 hour a day rehabilitation nursing.  The patient is  currently min A- Supervision with mobility and basic ADLs.  Discharge setting and therapy post discharge at home with home  health is anticipated.  Patient has agreed to participate in the Acute Inpatient Rehabilitation Program and will admit today.  Preadmission Screen Completed By:  Genella Mech, 12/21/2020 10:54 AM ______________________________________________________________________   Discussed status with Dr. Naaman Plummer  on 12/21/2020 at 945 and received approval for admission today.  Admission Coordinator:  Genella Mech, CCC-SLP, time 12/21/20*/Date 1100   Assessment/Plan: Diagnosis: left CVA 1. Does the need for close, 24 hr/day Medical supervision in concert with the patient's rehab needs make it unreasonable for this patient to be served in a less intensive setting? Yes 2. Co-Morbidities requiring supervision/potential complications: HTN, CAD, sz, schizophrenia 3. Due to bladder management, bowel management, safety, skin/wound care, disease management, medication administration, pain management and patient education, does the patient require 24 hr/day rehab nursing? Yes 4. Does the patient require coordinated care of a physician, rehab nurse, PT, OT, and SLP to address physical and functional deficits in the context of the above medical diagnosis(es)? Yes Addressing deficits in the following areas: balance, endurance, locomotion, strength, transferring, bowel/bladder control, bathing, dressing, feeding, grooming, toileting, cognition and psychosocial support 5. Can the patient actively participate in an intensive therapy program of at least 3 hrs of therapy 5 days a week? Yes 6. The potential for patient to make measurable gains while on inpatient rehab is excellent 7. Anticipated functional outcomes upon discharge from inpatient rehab: modified independent PT, modified independent OT, supervision SLP 8. Estimated rehab length of stay to reach the above functional goals is: 5-7 days 9. Anticipated discharge destination: Home 10. Overall Rehab/Functional Prognosis: excellent   MD Signature: Meredith Staggers, MD, [facility] 12/21/2020           Revision History                        Note Details  Author Meredith Staggers, MD File Time 12/21/2020 11:18 AM  Author Type Physician Status Signed  Last Editor Meredith Staggers, MD Service Physical Medicine and [place] # [id number] Admit Date 12/21/2020

## 2020-12-21 NOTE — Progress Notes (Signed)
Patient ID: Melvin Taylor, male   DOB: March 23, 1966, 55 y.o.   MRN: [id number] Met with the patient to introduce self and role of the nurse CM and initiate education. Patient confirmed caregiver responsible to DM monitoring and medication administration. Reviewed secondary stroke risks including DM (A!C 9.7) HLD (DLD 116; total 224) and HTN with Brilinta for anticoag. Patient unable to review meds. Incontinent of bowel with condom cath intact. Discussed taking condom off and toileting patient. Patient in agreement and acknowledged understanding of need to use bathroom and notification of staff of needs. Handouts to patient for sister/caregiver review. Will follow up with family on dietary modifications and smoking cessation. Continue to follow along to discharge to address educational needs and collaborate with the SW to facilitate preparation for discharge. Margarito Liner

## 2020-12-21 NOTE — H&P (Signed)
Physical Medicine and Rehabilitation Admission H&P    Chief Complaint  Patient presents with  . Code Stroke  : HPI: Melvin Taylor is a 55 year old right-handed male with history of hypertension hyperlipidemia, CAD with myocardial infarction maintained on Plavix, epilepsy/seizures maintained on Depakote as well as Lamictal, schizophrenia, diabetes mellitus, tobacco abuse as well as polysubstance abuse questionable medical compliance.  Per chart review patient lives alone.  1 level home.  Has a PCA 4 hours a day 6 days a week.  Independent with assistive device.  He has a sister in the area.  Presented 12/16/2020 with acute onset of right-sided weakness and speech difficulties.  CT/MRI showed scattered acute infarcts within the left MCA and MCA/PCA watershed territories affecting the cortical/subcortical left frontal parietal and occipital lobe.  Multiple acute infarcts also present within the left insula and basal ganglia.  No significant mass-effect.  No hemorrhagic conversion.  MRA demonstrated moderate stenosis within the proximal M1 and right MCA.  Severe stenosis within the P2 and right PCA.  Patient underwent mechanical thrombectomy per interventional radiology.  Echocardiogram with ejection fraction of 65 to [id number] grade 1 diastolic dysfunction.  EEG negative for seizure.  Admission chemistries glucose 175, creatinine 2.15, hemoglobin 12, hemoglobin A1c 9.7, urine drug screen was positive for marijuana.  Currently maintained on Brilinta for CVA prophylaxis, no aspirin secondary to questionable allergy.  Maintained on Lovenox for DVT prophylaxis.  Therapy evaluations completed due to patient's right side weakness and speech difficulties was admitted for a comprehensive rehab program.  Review of Systems  Constitutional: Negative for chills and fever.  HENT: Negative for hearing loss.   Eyes: Negative for blurred vision and double vision.  Respiratory: Negative for cough.        Shortness of  breath with exertion  Cardiovascular: Negative for chest pain, palpitations and leg swelling.  Gastrointestinal: Positive for constipation. Negative for heartburn, nausea and vomiting.  Genitourinary: Negative for dysuria, flank pain and hematuria.  Musculoskeletal: Positive for back pain, joint pain and myalgias.  Skin: Negative for rash.  Neurological: Positive for speech change, seizures, weakness and headaches.  Psychiatric/Behavioral: Positive for depression. The patient has insomnia.        Bipolar disorder/schizophrenia  All other systems reviewed and are negative.  Past Medical History:  Diagnosis Date  . Anxiety   . Bipolar 1 disorder ([place])   . Bone spur    left heel  . Cervical radiculopathy   . Chest pain   . Chronic back pain   . Chronic chest wall pain   . Chronic neck pain   . Coronary artery disease    a. s/p CABG in 03/2016 with LIMA-LAD, SVG-D1, SVG-RCA, and Seq SVG-mid and distal OM  . Depression   . Diabetes mellitus    Type II  . Hallucinations    "long history of them"  . Headache(784.0)   . History of gout   . HTN (hypertension)   . Insomnia   . Myocardial infarction ([place]) 2017  . Neuropathy   . Pain management   . Polysubstance abuse ([place])   . Right leg pain    chronic  . Schizophrenia ([place])   . Seizures ([place])    last sz between July 5-9th, 2016; epilepsy  . Shortness of breath dyspnea    with exertion  . Sleep apnea   . Transfusion of blood product refused for religious reason    Past Surgical History:  Procedure Laterality Date  . ANTERIOR CERVICAL  DECOMP/DISCECTOMY FUSION N/A 12/14/2015   Procedure: ANTERIOR CERVICAL DECOMPRESSION/DISCECTOMY FUSION CERVICAL FIVE -SIX;  Surgeon: Earnie Larsson, MD;  Location: [facility];  Service: Neurosurgery;  Laterality: N/A;  . BIOPSY  05/07/2015   Procedure: BIOPSY (Gastric);  Surgeon: [name], MD;  Location: [facility];  Service: Endoscopy;;  . CARDIAC CATHETERIZATION N/A 03/07/2016   Procedure: Left  Heart Cath and Coronary Angiography;  Surgeon: Belva Crome, MD;  Location: [facility];  Service: Cardiovascular;  Laterality: N/A;  . COLONOSCOPY WITH PROPOFOL N/A 05/07/2015   [id number] diverticulosis, multiple colon polyps removed, tubular adenoma, serrated colon polyp. Next colonoscopy October 2019  . COLONOSCOPY WITH PROPOFOL N/A 08/02/2018   Procedure: COLONOSCOPY WITH PROPOFOL;  Surgeon: [name], MD;  Location: [facility];  Service: Endoscopy;  Laterality: N/A;  12:00pm  . CORONARY ARTERY BYPASS GRAFT N/A 03/28/2016   Procedure: CORONARY ARTERY BYPASS GRAFTING (CABG) x 5 USING GREATER SAPHENOUS VEIN;  Surgeon: [name], MD;  Location: [facility];  Service: Open Heart Surgery;  Laterality: N/A;  . ENDOVEIN HARVEST OF GREATER SAPHENOUS VEIN Right 03/28/2016   Procedure: ENDOVEIN HARVEST OF GREATER SAPHENOUS VEIN;  Surgeon: [name], MD;  Location: [facility];  Service: Open Heart Surgery;  Laterality: Right;  . ESOPHAGEAL DILATION N/A 05/07/2015   Procedure: ESOPHAGEAL DILATION WITH 56FR MALONEY DILATOR;  Surgeon: [name], MD;  Location: [facility];  Service: Endoscopy;  Laterality: N/A;  . ESOPHAGOGASTRODUODENOSCOPY (EGD) WITH PROPOFOL N/A 05/07/2015   RMR: Status post dilation of normal esophagus. Gastritis.  Marland Kitchen KNEE SURGERY Left    arthroscopy  . MANDIBLE FRACTURE SURGERY    . POLYPECTOMY  05/07/2015   Procedure: POLYPECTOMY (Hepatic Flexure, Distal Transverse Colon, Rectal);  Surgeon: [name], MD;  Location: [facility];  Service: Endoscopy;;  . RADIOLOGY WITH ANESTHESIA N/A 12/16/2020   Procedure: IR WITH ANESTHESIA;  Surgeon: Luanne Bras, MD;  Location: [facility];  Service: Radiology;  Laterality: N/A;  . TEE WITHOUT CARDIOVERSION N/A 03/28/2016   Procedure: TRANSESOPHAGEAL ECHOCARDIOGRAM (TEE);  Surgeon: [name], MD;  Location: [facility];  Service: Open Heart Surgery;  Laterality: N/A;  . TONSILLECTOMY     Family History  Problem Relation Age of Onset   . Diabetes Father   . Hypertension Father   . Arthritis Other   . Diabetes Other   . Asthma Other   . Stroke Sister    Social History:  reports that he has been smoking cigarettes. He has a 15.00 pack-year smoking history. He has never used smokeless tobacco. He reports current drug use. Drug: Marijuana. He reports that he does not drink alcohol. Allergies:  Allergies  Allergen Reactions  . Aspirin Swelling and Other (See Comments)    Facial swelling    Medications Prior to Admission  Medication Sig Dispense Refill  . albuterol (PROVENTIL HFA;VENTOLIN HFA) 108 (90 Base) MCG/ACT inhaler Inhale 2 puffs into the lungs every 4 (four) hours as needed for wheezing or shortness of breath.     Marland Kitchen amLODipine (NORVASC) 10 MG tablet Take 1 tablet (10 mg total) by mouth daily. 30 tablet 3  . atorvastatin (LIPITOR) 40 MG tablet Take 40 mg by mouth daily.    . baclofen (LIORESAL) 10 MG tablet Take 10 mg by mouth 2 (two) times daily.    . clopidogrel (PLAVIX) 75 MG tablet Take 75 mg by mouth daily.    . cycloSPORINE (RESTASIS) 0.05 % ophthalmic emulsion Place 1 drop into  both eyes 2 (two) times daily.    . diazepam (VALIUM) 5 MG tablet Take 5 mg by mouth 2 (two) times daily as needed for anxiety.     . divalproex (DEPAKOTE) 500 MG DR tablet Take 3 tablets (1,500 mg total) by mouth 2 (two) times daily. 540 tablet 4  . escitalopram (LEXAPRO) 20 MG tablet Take 20 mg by mouth daily.     Marland Kitchen ibuprofen (ADVIL) 800 MG tablet Take 800 mg by mouth every 6 (six) hours as needed for moderate pain.    Marland Kitchen insulin aspart (NOVOLOG) 100 UNIT/ML FlexPen Inject 4-10 Units into the skin 3 (three) times daily with meals. (Patient taking differently: Inject 4 Units into the skin in the morning, at noon, in the evening, and at bedtime.) 15 mL 1  . insulin glargine (LANTUS SOLOSTAR) 100 UNIT/ML Solostar Pen Inject 40 Units into the skin at bedtime. 30 mL 0  . lamoTRIgine (LAMICTAL) 25 MG tablet Take 50 mg by mouth 2 (two) times  daily.    Marland Kitchen lisinopril (ZESTRIL) 20 MG tablet TAKE ONE TABLET BY MOUTH ONCE DAILY. (Patient taking differently: Take 20 mg by mouth daily.) 90 tablet 3  . Melatonin 10 MG CAPS Take 10 capsules by mouth at bedtime as needed (for sleep).    . nitroGLYCERIN (NITROSTAT) 0.4 MG SL tablet Place 1 tablet (0.4 mg total) under the tongue every 5 (five) minutes as needed for chest pain. 25 tablet 3  . OLANZapine (ZYPREXA) 5 MG tablet Take 5 mg by mouth in the morning and at bedtime.    Marland Kitchen oxyCODONE-acetaminophen (PERCOCET) 5-325 MG tablet Take 1-2 tablets by mouth every 4 (four) hours as needed for severe pain. 30 tablet 0  . ranolazine (RANEXA) 500 MG 12 hr tablet Take 1 tablet (500 mg total) by mouth 2 (two) times daily. 180 tablet 3  . risperiDONE (RISPERDAL) 3 MG tablet Take 3 mg by mouth at bedtime.    . isosorbide mononitrate (IMDUR) 30 MG 24 hr tablet Take 1 tablet (30 mg total) by mouth daily. (Patient not taking: Reported on 12/17/2020) 30 tablet 1  . paliperidone (INVEGA) 6 MG 24 hr tablet Take 12 mg by mouth daily. (Patient not taking: Reported on 12/17/2020)      Drug Regimen Review Drug regimen was reviewed and remains appropriate with no significant issues identified  Home: Home Living Family/patient expects to be discharged to:: Private residence Living Arrangements: Alone Available Help at Discharge: Family,Personal care attendant (PCA 4 hrs/day 6 days/wk) Type of Home: Apartment Home Access: Level entry Home Layout: One level Bathroom Shower/Tub: Chiropodist: Standard Bathroom Accessibility: Yes Home Equipment: Environmental consultant - 2 wheels  Lives With: Alone   Functional History: Prior Function Level of Independence: Needs assistance,Independent,Independent with assistive device(s) Gait / Transfers Assistance Needed: ambulated independently ADL's / Homemaking Assistance Needed: I with ADL, assist with IADL tasks form PCA for medicaiton managment, cooking, some shopping;  family also assisted. Pt does some cooking himself and occasionally does his own laundry. L:ikes to walk Communication / Swallowing Assistance Needed: [facility]  Functional Status:  Mobility: Bed Mobility Overal bed mobility: Needs Assistance Bed Mobility: Supine to Sit Supine to sit: Min guard General bed mobility comments: Pt able to come to sit with min guard for safety and extra time. Transfers Overall transfer level: Needs assistance Equipment used: Rolling walker (2 wheeled) Transfers: Sit to/from [employer] Sit to Stand: Min assist,+2 safety/equipment Stand pivot transfers: Min assist,+2 safety/equipment General transfer comment:  MinA for steadying and to direct pt to step to L to transfer to recliner with RW. cues to reach back for target surface. Sit > stand 1x from bed, 2x from recliner, and 1x from toilet. Ambulation/Gait Ambulation/Gait assistance: Min assist Gait Distance (Feet):  (x4 bouts of ~3 ft > ~10 ft > ~15 ft > ~15 ft) Assistive device: Rolling walker (2 wheeled) Gait Pattern/deviations: Step-through pattern,Decreased step length - right,Decreased stride length,Decreased dorsiflexion - right,Trunk flexed General Gait Details: Pt with short, unstable gait, impulsively reaching off BOS on occasion. Pt neglectful of R side, bumping into objects and stepping R foot lateral to RW, needing cues to attend to and correct. MinA for steadying and directing pt. Gait velocity: reduced Gait velocity interpretation: <1.31 ft/sec, indicative of household ambulator    ADL: ADL Overall ADL's : Needs assistance/impaired Eating/Feeding: Set up,Sitting Grooming: Set up,Sitting Upper Body Bathing: Set up,Sitting Lower Body Bathing: Minimal assistance,Sit to/from stand Upper Body Dressing : Minimal assistance,Sitting Lower Body Dressing: Minimal assistance,Sit to/from stand Toilet Transfer: Minimal assistance,+2 for safety/equipment,RW Toileting- Clothing Manipulation  and Hygiene: Total assistance Toileting - Clothing Manipulation Details (indicate cue type and reason): condom cath Functional mobility during ADLs: Minimal assistance,+2 for safety/equipment,Rolling walker,Cueing for safety  Cognition: Cognition Overall Cognitive Status: Impaired/Different from baseline Arousal/Alertness: Awake/alert Orientation Level: Oriented to person,Oriented to place,Oriented to situation Attention: Sustained Sustained Attention: Impaired Sustained Attention Impairment: Verbal basic Memory: Impaired Memory Impairment: Decreased recall of new information Awareness: Impaired Awareness Impairment: Intellectual impairment,Emergent impairment,Anticipatory impairment Problem Solving: Impaired Problem Solving Impairment: Verbal basic,Functional basic Safety/Judgment: Impaired Cognition Arousal/Alertness: Awake/alert Behavior During Therapy: Flat affect,Impulsive Overall Cognitive Status: Impaired/Different from baseline Area of Impairment: Orientation,Attention,Memory,Following commands,Safety/judgement,Awareness,Problem solving Orientation Level: Disoriented to,Place,Time (aware he had a stroke) Current Attention Level: Selective Memory: Decreased recall of precautions,Decreased short-term memory Following Commands: Follows one step commands with increased time Safety/Judgement: Decreased awareness of safety,Decreased awareness of deficits Awareness: Intellectual Problem Solving: Slow processing,Difficulty sequencing,Requires verbal cues General Comments: Pt with poor awareness into his deficits and thus safety, needing cues to attend to R side at times. Pt impulsive and forgetful of task at hand.  Physical Exam: Blood pressure (!) 153/72, pulse 69, temperature 98.1 F (36.7 C), temperature source Oral, resp. rate 18, height 5\' 7"  (1.702 m), weight 61.3 kg, SpO2 100 %. Physical Exam Constitutional:      Appearance: He is ill-appearing.     Comments: lethargic   HENT:     Head: Normocephalic and atraumatic.     Right Ear: External ear normal.     Left Ear: External ear normal.     Nose: Nose normal.     Mouth/Throat:     Mouth: Mucous membranes are moist.  Eyes:     Extraocular Movements: Extraocular movements intact.     Conjunctiva/sclera: Conjunctivae normal.     Pupils: Pupils are equal, round, and reactive to light.  Cardiovascular:     Rate and Rhythm: Normal rate and regular rhythm.     Heart sounds: No murmur heard.   Pulmonary:     Effort: Pulmonary effort is normal. No respiratory distress.     Breath sounds: No wheezing.  Abdominal:     General: Bowel sounds are normal. There is no distension.     Tenderness: There is no abdominal tenderness.  Musculoskeletal:        General: No swelling or tenderness.     Cervical back: Normal range of motion.     Right lower leg: No edema.  Left lower leg: No edema.  Skin:    General: Skin is warm.  Neurological:     Comments: Pt is lethargic. Does open eyes and follow commands with verbal cueing. Told me he was from [place], didn't know where he was. Oriented to name. RUE grossly 3/5, RLE 2+ to 3-/5 with inconsistent effort. LUE and LLE 3-4/5 with inconsistent effort. Withdrew to pain on all 4. No abnl tone.   Psychiatric:     Comments: Flat and disengaged     Results for orders placed or performed during the hospital encounter of 12/16/20 (from the past 48 hour(s))  Glucose, capillary     Status: Abnormal   Collection Time: 12/19/20  7:56 AM  Result Value Ref Range   Glucose-Capillary 150 (H) 70 - 99 mg/dL    Comment: Glucose reference range applies only to samples taken after fasting for at least 8 hours.  Glucose, capillary     Status: Abnormal   Collection Time: 12/19/20 11:36 AM  Result Value Ref Range   Glucose-Capillary 216 (H) 70 - 99 mg/dL    Comment: Glucose reference range applies only to samples taken after fasting for at least 8 hours.  CBC     Status:  Abnormal   Collection Time: 12/19/20 11:57 AM  Result Value Ref Range   WBC 4.5 4.0 - 10.5 K/uL   RBC 3.36 (L) 4.22 - 5.81 MIL/uL   Hemoglobin 10.5 (L) 13.0 - 17.0 g/dL   HCT 85.2 (L) 77.8 - 24.2 %   MCV 90.2 80.0 - 100.0 fL   MCH 31.3 26.0 - 34.0 pg   MCHC 34.7 30.0 - 36.0 g/dL   RDW 35.3 61.4 - 43.1 %   Platelets 129 (L) 150 - 400 K/uL    Comment: REPEATED TO VERIFY   nRBC 0.0 0.0 - 0.2 %    Comment: Performed at [facility], [address] [place], [place] [address]  Glucose, capillary     Status: Abnormal   Collection Time: 12/19/20  5:04 PM  Result Value Ref Range   Glucose-Capillary 231 (H) 70 - 99 mg/dL    Comment: Glucose reference range applies only to samples taken after fasting for at least 8 hours.  Glucose, capillary     Status: Abnormal   Collection Time: 12/19/20  8:00 PM  Result Value Ref Range   Glucose-Capillary 227 (H) 70 - 99 mg/dL    Comment: Glucose reference range applies only to samples taken after fasting for at least 8 hours.   Comment 1 Notify RN    Comment 2 Document in Chart   Glucose, capillary     Status: Abnormal   Collection Time: 12/19/20 10:41 PM  Result Value Ref Range   Glucose-Capillary 202 (H) 70 - 99 mg/dL    Comment: Glucose reference range applies only to samples taken after fasting for at least 8 hours.  Basic metabolic panel     Status: Abnormal   Collection Time: 12/20/20  1:24 AM  Result Value Ref Range   Sodium 137 135 - 145 mmol/L   Potassium 3.7 3.5 - 5.1 mmol/L   Chloride 108 98 - 111 mmol/L   CO2 22 22 - 32 mmol/L   Glucose, Bld 96 70 - 99 mg/dL    Comment: Glucose reference range applies only to samples taken after fasting for at least 8 hours.   BUN 20 6 - 20 mg/dL   Creatinine, Ser 6.76 (H) 0.61 - 1.24 mg/dL  Calcium 8.4 (L) 8.9 - 10.3 mg/dL   GFR, Estimated 50 (L) >60 mL/min    Comment: (NOTE) Calculated using the CKD-EPI Creatinine Equation (2021)    Anion gap 7 5 - 15    Comment: Performed at [facility], [place] [address] [place], [place] [address]  CBC with Differential/Platelet     Status: Abnormal   Collection Time: 12/20/20  1:24 AM  Result Value Ref Range   WBC 5.9 4.0 - 10.5 K/uL   RBC 3.19 (L) 4.22 - 5.81 MIL/uL   Hemoglobin 10.0 (L) 13.0 - 17.0 g/dL   HCT 28.1 (L) 39.0 - 52.0 %   MCV 88.1 80.0 - 100.0 fL   MCH 31.3 26.0 - 34.0 pg   MCHC 35.6 30.0 - 36.0 g/dL   RDW 12.1 11.5 - 15.5 %   Platelets  150 - 400 K/uL    PLATELET CLUMPS NOTED ON SMEAR, COUNT APPEARS DECREASED    Comment: Immature Platelet Fraction may be clinically indicated, consider ordering this additional test [id number]    nRBC 0.0 0.0 - 0.2 %   Neutrophils Relative % 82 %   Neutro Abs 4.8 1.7 - 7.7 K/uL   Lymphocytes Relative 10 %   Lymphs Abs 0.6 (L) 0.7 - 4.0 K/uL   Monocytes Relative 8 %   Monocytes Absolute 0.5 0.1 - 1.0 K/uL   Eosinophils Relative 0 %   Eosinophils Absolute 0.0 0.0 - 0.5 K/uL   Basophils Relative 0 %   Basophils Absolute 0.0 0.0 - 0.1 K/uL   Immature Granulocytes 0 %   Abs Immature Granulocytes 0.02 0.00 - 0.07 K/uL    Comment: Performed at [facility], [place] [address] [place], [place] [address]  Valproic acid level     Status: None   Collection Time: 12/20/20  1:24 AM  Result Value Ref Range   Valproic Acid Lvl 88 50.0 - 100.0 ug/mL    Comment: Performed at [facility] [address] [place], [place] [address]  Glucose, capillary     Status: Abnormal   Collection Time: 12/20/20  6:47 AM  Result Value Ref Range   Glucose-Capillary 115 (H) 70 - 99 mg/dL    Comment: Glucose reference range applies only to samples taken after fasting for at least 8 hours.   Comment 1 Notify RN    Comment 2 Document in Chart   Glucose, capillary     Status: Abnormal   Collection Time: 12/20/20 11:45 AM  Result Value Ref Range   Glucose-Capillary 147 (H) 70 - 99 mg/dL    Comment: Glucose reference range applies only to samples taken after fasting for at least 8 hours.   Glucose, capillary     Status: Abnormal   Collection Time: 12/20/20 12:06 PM  Result Value Ref Range   Glucose-Capillary 198 (H) 70 - 99 mg/dL    Comment: Glucose reference range applies only to samples taken after fasting for at least 8 hours.   Comment 1 Notify RN    Comment 2 Document in Chart   Glucose, capillary     Status: Abnormal   Collection Time: 12/20/20  4:09 PM  Result Value Ref Range   Glucose-Capillary 182 (H) 70 - 99 mg/dL    Comment: Glucose reference range applies only to samples taken after fasting for at least 8 hours.  Glucose, capillary     Status: Abnormal   Collection Time: 12/20/20  9:23 PM  Result Value Ref Range  Glucose-Capillary 123 (H) 70 - 99 mg/dL    Comment: Glucose reference range applies only to samples taken after fasting for at least 8 hours.  Basic metabolic panel     Status: Abnormal   Collection Time: 12/21/20  5:33 AM  Result Value Ref Range   Sodium 136 135 - 145 mmol/L   Potassium 4.2 3.5 - 5.1 mmol/L   Chloride 108 98 - 111 mmol/L   CO2 23 22 - 32 mmol/L   Glucose, Bld 143 (H) 70 - 99 mg/dL    Comment: Glucose reference range applies only to samples taken after fasting for at least 8 hours.   BUN 16 6 - 20 mg/dL   Creatinine, Ser 1.39 (H) 0.61 - 1.24 mg/dL   Calcium 8.3 (L) 8.9 - 10.3 mg/dL   GFR, Estimated 59 (L) >60 mL/min    Comment: (NOTE) Calculated using the CKD-EPI Creatinine Equation (2021)    Anion gap 5 5 - 15    Comment: Performed at [facility] [address], [place],  [address]  CBC with Differential/Platelet     Status: Abnormal (Preliminary result)   Collection Time: 12/21/20  5:33 AM  Result Value Ref Range   WBC 2.9 (L) 4.0 - 10.5 K/uL   RBC 3.25 (L) 4.22 - 5.81 MIL/uL   Hemoglobin 10.1 (L) 13.0 - 17.0 g/dL   HCT 28.7 (L) 39.0 - 52.0 %   MCV 88.3 80.0 - 100.0 fL   MCH 31.1 26.0 - 34.0 pg   MCHC 35.2 30.0 - 36.0 g/dL   RDW 12.1 11.5 - 15.5 %   Platelets PENDING 150 - 400 K/uL   nRBC 0.0 0.0 -  0.2 %    Comment: Performed at [facility], [place] [address] [place], [place] [address]   Neutrophils Relative % PENDING %   Neutro Abs PENDING 1.7 - 7.7 K/uL   Band Neutrophils PENDING %   Lymphocytes Relative PENDING %   Lymphs Abs PENDING 0.7 - 4.0 K/uL   Monocytes Relative PENDING %   Monocytes Absolute PENDING 0.1 - 1.0 K/uL   Eosinophils Relative PENDING %   Eosinophils Absolute PENDING 0.0 - 0.5 K/uL   Basophils Relative PENDING %   Basophils Absolute PENDING 0.0 - 0.1 K/uL   WBC Morphology PENDING    RBC Morphology PENDING    Smear Review PENDING    Other PENDING %   nRBC PENDING 0 /100 WBC   Metamyelocytes Relative PENDING %   Myelocytes PENDING %   Promyelocytes Relative PENDING %   Blasts PENDING %   Immature Granulocytes PENDING %   Abs Immature Granulocytes PENDING 0.00 - 0.07 K/uL  Valproic acid level     Status: None   Collection Time: 12/21/20  5:33 AM  Result Value Ref Range   Valproic Acid Lvl 54 50.0 - 100.0 ug/mL    Comment: Performed at [facility] [place] [address], [place], [place] [address]  Glucose, capillary     Status: Abnormal   Collection Time: 12/21/20  6:28 AM  Result Value Ref Range   Glucose-Capillary 145 (H) 70 - 99 mg/dL    Comment: Glucose reference range applies only to samples taken after fasting for at least 8 hours.   VAS [place] GROIN PSEUDOANEURYSM  Result Date: 12/19/2020  ARTERIAL PSEUDOANEURYSM  Patient Name:  ZAIR BORAWSKI  Date of Exam:   12/19/2020 Medical Rec #: [id number]  Accession #:    [id number] Date of Birth: [birth year]           Patient Gender: M Patient Age:   54Y Exam Location:  [facility] Procedure:      VAS [place] Gloriajean Dell Referring Phys: [id number] [place] --------------------------------------------------------------------------------  Exam: Right groin Indications: Decreased hematocrit. History: S/p right groin access. Comparison Study: No prior study Performing  Technologist: Maudry Mayhew MHA, RDMS, RVT, RDCS  Examination Guidelines: A complete evaluation includes B-mode imaging, spectral Doppler, color Doppler, and power Doppler as needed of all accessible portions of each vessel. Bilateral testing is considered an integral part of a complete examination. Limited examinations for reoccurring indications may be performed as noted. +------------+----------+--------+------+----------+ Right DuplexPSV (cm/s)WaveformPlaqueComment(s) +------------+----------+--------+------+----------+ CFA            224                             +------------+----------+--------+------+----------+ Prox SFA       159                             +------------+----------+--------+------+----------+  Summary: No evidence of pseudoaneurysm, AVF or DVT    --------------------------------------------------------------------------------    Preliminary        Medical Problem List and Plan: 1.  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  Antithrombotics: -DVT/anticoagulation: Lovenox  -antiplatelet therapy: Brilinta 90 mg twice daily 3. Pain Management: Tylenol as needed 4. Mood: Lexapro 20 mg daily, melatonin 10 mg nightly as needed  -antipsychotic agents: N/A 5. Neuropsych: This patient is not capable of making decisions on his own behalf. 6. Skin/Wound Care: Routine skin checks 7. Fluids/Electrolytes/Nutrition: Routine in and outs with follow-up chemistries 8.  Epilepsy/seizures, Lamictal 50 mg twice daily, Depakote 1000 mg every 12 hours.  EEG negative for seizure 9.  Hyperlipidemia.  Lipitor 10.  Hypertension.  Norvasc 10 mg daily, HCTZ 25 mg daily Imdur 30 mg daily.  Monitor with increased mobility 11.  Diabetes mellitus.  Hemoglobin A1c 9.7.  SSI.  Patient on Lantus 40 units nightly prior to  admission.  Resume as needed  5/23 reasonable control at present 12.  CAD with myocardial infarction.  Patient now on Brilinta 13.  History of tobacco polysubstance abuse.  Urine drug screen positive marijuana.  Provide counseling 14.  AKI on CKD admission creatinine 2.15 improved to 1.39.  Follow-up chemistries  -encourage adequate intake  -wasn't motivated to eat much while I was in room today. 15.  Questionable medical noncompliance.  Provide counseling 16. Lethargy: check labwork tomorrow morning including vpa level    Cathlyn Parsons, PA-C 12/21/2020

## 2020-12-21 NOTE — Discharge Summary (Addendum)
Stroke Discharge Summary  Patient ID: Melvin Taylor   MRN: [id number]      DOB: January 01, 1966  Date of Admission: 12/16/2020 Date of Discharge: 12/21/2020  Attending Physician:  Dr. Antony Contras Consultant(s):  PM&R Patient's PCP:  Melvin Robert, FNP  Discharge Diagnoses:  1. Left MCA infarct due to left M1 occlusion s/p IR with rescue left MCA stenting, secondary to ICAD 2. Seizure disorder 3. HTN 4. HLD 5. Tobacco Abuse 6. History of stroke  7. THC abuse 8. CAD 9. OSA 10. AKI on CKD 3 11. Schizophrenia   Active Problems:   Diabetes mellitus ([place])   Acute ischemic left MCA stroke ([place])   Middle cerebral artery embolism, left   Encounter for intubation   Renal insufficiency   Malignant hypertension   Malnutrition of moderate degree   Medications to be continued on Rehab Allergies as of 12/21/2020      Reactions   Aspirin Swelling, Other (See Comments)   Facial swelling       Medication List    STOP taking these medications   baclofen 10 MG tablet Commonly known as: LIORESAL   clopidogrel 75 MG tablet Commonly known as: PLAVIX   diazepam 5 MG tablet Commonly known as: VALIUM   ibuprofen 800 MG tablet Commonly known as: ADVIL   insulin aspart 100 UNIT/ML FlexPen Commonly known as: NOVOLOG Replaced by: insulin aspart 100 UNIT/ML injection   oxyCODONE-acetaminophen 5-325 MG tablet Commonly known as: Percocet     TAKE these medications   acetaminophen 325 MG tablet Commonly known as: TYLENOL Take 2 tablets (650 mg total) by mouth every 4 (four) hours as needed for mild pain (or temp > 37.5 C (99.5 F)).   albuterol 108 (90 Base) MCG/ACT inhaler Commonly known as: VENTOLIN HFA Inhale 2 puffs into the lungs every 4 (four) hours as needed for wheezing or shortness of breath.   amLODipine 10 MG tablet Commonly known as: NORVASC Take 1 tablet (10 mg total) by mouth daily.   atorvastatin 80 MG tablet Commonly known as: LIPITOR Take 1 tablet (80  mg total) by mouth at bedtime. What changed:   medication strength  how much to take  when to take this   cycloSPORINE 0.05 % ophthalmic emulsion Commonly known as: RESTASIS Place 1 drop into both eyes 2 (two) times daily.   divalproex 500 MG DR tablet Commonly known as: DEPAKOTE Take 2 tablets (1,000 mg total) by mouth every 12 (twelve) hours. What changed:   how much to take  when to take this   enoxaparin 40 MG/0.4ML injection Commonly known as: LOVENOX Inject 0.4 mLs (40 mg total) into the skin daily.   escitalopram 20 MG tablet Commonly known as: LEXAPRO Take 20 mg by mouth daily.   feeding supplement Liqd Take 237 mLs by mouth 3 (three) times daily between meals.   hydrochlorothiazide 25 MG tablet Commonly known as: HYDRODIURIL Take 1 tablet (25 mg total) by mouth daily. Start taking on: Dec 22, 2020   insulin aspart 100 UNIT/ML injection Commonly known as: novoLOG Inject 0-9 Units into the skin 4 (four) times daily -  with meals and at bedtime. Replaces: insulin aspart 100 UNIT/ML FlexPen   isosorbide mononitrate 30 MG 24 hr tablet Commonly known as: IMDUR Take 1 tablet (30 mg total) by mouth daily.   lamoTRIgine 25 MG tablet Commonly known as: LAMICTAL Take 50 mg by mouth 2 (two) times daily.   Lantus SoloStar 100 UNIT/ML Solostar  Pen Generic drug: insulin glargine Inject 40 Units into the skin at bedtime. HOME MEDS ON HOLD IN ACUTE SETTING What changed: additional instructions   lisinopril 20 MG tablet Commonly known as: ZESTRIL TAKE ONE TABLET BY MOUTH ONCE DAILY.   Melatonin 10 MG Caps Take 10 capsules by mouth at bedtime as needed (for sleep).   multivitamin with minerals Tabs tablet Take 1 tablet by mouth daily. Start taking on: Dec 22, 2020   nitroGLYCERIN 0.4 MG SL tablet Commonly known as: NITROSTAT Place 1 tablet (0.4 mg total) under the tongue every 5 (five) minutes as needed for chest pain.   OLANZapine 5 MG tablet Commonly  known as: ZYPREXA Take 5 mg by mouth in the morning and at bedtime.   paliperidone 6 MG 24 hr tablet Commonly known as: INVEGA Take 2 tablets (12 mg total) by mouth daily. Patient not taking, but is prescribed by his mental health provider What changed: additional instructions   pantoprazole 40 MG tablet Commonly known as: PROTONIX Take 1 tablet (40 mg total) by mouth daily. Start taking on: Dec 22, 2020   ranolazine 500 MG 12 hr tablet Commonly known as: Ranexa Take 1 tablet (500 mg total) by mouth 2 (two) times daily.   risperiDONE 3 MG tablet Commonly known as: RISPERDAL Take 3 mg by mouth at bedtime.   senna-docusate 8.6-50 MG tablet Commonly known as: Senokot-S Take 1 tablet by mouth at bedtime as needed for mild constipation or moderate constipation.   ticagrelor 90 MG Tabs tablet Commonly known as: BRILINTA Take 1 tablet (90 mg total) by mouth 2 (two) times daily.       LABORATORY STUDIES CBC    Component Value Date/Time   WBC 2.9 (L) 12/21/2020 0533   RBC 3.25 (L) 12/21/2020 0533   HGB 10.1 (L) 12/21/2020 0533   HCT 28.7 (L) 12/21/2020 0533   PLT 114 (L) 12/21/2020 0533   MCV 88.3 12/21/2020 0533   MCH 31.1 12/21/2020 0533   MCHC 35.2 12/21/2020 0533   RDW 12.1 12/21/2020 0533   LYMPHSABS 0.5 (L) 12/21/2020 0533   MONOABS 0.6 12/21/2020 0533   EOSABS 0.0 12/21/2020 0533   BASOSABS 0.0 12/21/2020 0533   CMP    Component Value Date/Time   NA 136 12/21/2020 0533   NA 141 02/12/2020 1334   K 4.2 12/21/2020 0533   CL 108 12/21/2020 0533   CO2 23 12/21/2020 0533   GLUCOSE 143 (H) 12/21/2020 0533   BUN 16 12/21/2020 0533   BUN 16 08/27/2020 0000   CREATININE 1.39 (H) 12/21/2020 0533   CREATININE 0.99 03/17/2016 1436   CALCIUM 8.3 (L) 12/21/2020 0533   PROT 5.1 (L) 12/16/2020 2028   PROT 5.4 (L) 02/12/2020 1334   ALBUMIN 2.8 (L) 12/16/2020 2028   ALBUMIN 3.4 (L) 02/12/2020 1334   AST 16 12/16/2020 2028   ALT 12 12/16/2020 2028   ALKPHOS 39  12/16/2020 2028   BILITOT 0.5 12/16/2020 2028   BILITOT 0.4 02/12/2020 1334   GFRNONAA 59 (L) 12/21/2020 0533   GFRAA 51 08/25/2020 0000   COAGS Lab Results  Component Value Date   INR 1.0 12/16/2020   INR 1.02 04/26/2016   INR 1.53 03/28/2016   Lipid Panel    Component Value Date/Time   CHOL 224 (H) 12/17/2020 0254   CHOL 286 (H) 02/12/2020 1334   TRIG 146 12/17/2020 0254   HDL 79 12/17/2020 0254   HDL 118 02/12/2020 1334   CHOLHDL 2.8 12/17/2020 0254  VLDL 29 12/17/2020 0254   LDLCALC 116 (H) 12/17/2020 0254   LDLCALC 145 (H) 02/12/2020 1334   HgbA1C  Lab Results  Component Value Date   HGBA1C 9.7 (H) 12/17/2020   Urinalysis    Component Value Date/Time   COLORURINE YELLOW 12/17/2020 0254   APPEARANCEUR CLEAR 12/17/2020 0254   LABSPEC 1.038 (H) 12/17/2020 0254   PHURINE 6.0 12/17/2020 0254   GLUCOSEU 50 (A) 12/17/2020 0254   HGBUR SMALL (A) 12/17/2020 0254   BILIRUBINUR NEGATIVE 12/17/2020 0254   KETONESUR 5 (A) 12/17/2020 0254   PROTEINUR 100 (A) 12/17/2020 0254   UROBILINOGEN 0.2 10/15/2014 1430   NITRITE NEGATIVE 12/17/2020 0254   LEUKOCYTESUR NEGATIVE 12/17/2020 0254   Urine Drug Screen     Component Value Date/Time   LABOPIA NONE DETECTED 12/17/2020 0254   COCAINSCRNUR NONE DETECTED 12/17/2020 0254   LABBENZ NONE DETECTED 12/17/2020 0254   AMPHETMU NONE DETECTED 12/17/2020 0254   THCU POSITIVE (A) 12/17/2020 0254   LABBARB NONE DETECTED 12/17/2020 0254    Alcohol Level    Component Value Date/Time   ETH <10 12/16/2020 2028     SIGNIFICANT DIAGNOSTIC STUDIES DG Sacrum/Coccyx  Result Date: 11/30/2020 CLINICAL DATA:  Recent fall with sacral pain, initial encounter EXAM: SACRUM AND COCCYX - 2+ VIEW COMPARISON:  None. FINDINGS: Sacrum is well visualized. Sacral ala are unremarkable. No acute fracture is seen. IMPRESSION: No acute abnormality noted. Electronically Signed   By: Inez Catalina M.D.   On: 11/30/2020 15:45   CT HEAD WO  CONTRAST  Result Date: 12/17/2020 CLINICAL DATA:  55 year old male code stroke presentation with left MCA M1 ELVO status post endovascular reperfusion. EXAM: CT HEAD WITHOUT CONTRAST TECHNIQUE: Contiguous axial images were obtained from the base of the skull through the vertex without intravenous contrast. COMPARISON:  CTA, CTP and CT head yesterday. FINDINGS: Brain: Pseudo normalization of the left inferior frontal gyrus which was hypodense at presentation yesterday (series 3, image 19). Trace subarachnoid hemorrhage in the left central sulcus on series 3, image 24. Trace contrast staining suspected in the left inferior basal ganglia series 3, image 15. And questionable additional mild gyral contrast staining in the anterior left MCA territory. Scattered areas of subcortical and central white matter hypodensity in the left frontal lobe are otherwise stable to decreased in conspicuity (series 3, image 22). But there is no confluent cytotoxic edema evident, and no intracranial mass effect or midline shift. No ventriculomegaly. No other intracranial hemorrhage identified. Vascular: Left MCA M1 region vascular stent is new since presentation. Skull: No acute osseous abnormality identified. Sinuses/Orbits: Trace fluid and mucosal thickening in the paranasal sinuses now. Mastoids and tympanic cavities remain clear. Other: Intubated. Fluid in the nasopharynx. Negative orbit and scalp soft tissues. IMPRESSION: 1. Some pseudo-normalization of the left inferior frontal gyrus hypodensity since the presentation CT yesterday which might be related to contrast staining. But there is otherwise little to no cytotoxic edema evident in the Left MCA territory. 2. Trace subarachnoid hemorrhage in the left hemisphere. No malignant hemorrhagic transformation or intracranial mass effect. 3. New left MCA M1 stent. Electronically Signed   By: Genevie Ann M.D.   On: 12/17/2020 06:15   CT Head Wo Contrast  Result Date: 12/12/2020 CLINICAL  DATA:  55 year old with acute onset of headache and possible fever that began yesterday. Current history of hypertension. EXAM: CT HEAD WITHOUT CONTRAST TECHNIQUE: Contiguous axial images were obtained from the base of the skull through the vertex without intravenous contrast. COMPARISON:  06/09/2018  and earlier. FINDINGS: Brain: Ventricular system normal in size and appearance for age. No mass lesion. No midline shift. No acute hemorrhage or hematoma. No extra-axial fluid collections. No evidence of acute infarction. No interval change. Vascular: Mild BILATERAL carotid siphon atherosclerosis. No hyperdense vessel. Skull: No skull fracture or other focal osseous abnormality involving the skull. Sinuses/Orbits: Visualized paranasal sinuses, bilateral mastoid air cells and bilateral middle ear cavities well-aerated. Visualized orbits and globes normal in appearance. Other: None. IMPRESSION: No acute intracranial abnormality.  Stable examination. Electronically Signed   By: Hulan Saas M.D.   On: 12/12/2020 11:57   MR ANGIO HEAD WO CONTRAST  Result Date: 12/17/2020 CLINICAL DATA:  Stroke, follow-up. EXAM: MRI HEAD WITHOUT CONTRAST MRA HEAD WITHOUT CONTRAST TECHNIQUE: Multiplanar, multi-echo pulse sequences of the brain and surrounding structures were acquired without intravenous contrast. Angiographic images of the Circle of Willis were acquired using MRA technique without intravenous contrast. COMPARISON: No pertinent prior exam. COMPARISON:  Noncontrast head CT 12/16/2020. CT angiogram head/neck and CT perfusion 12/16/2020. FINDINGS: MRI HEAD FINDINGS Brain: Mild intermittent motion degradation. Mild cerebral and cerebellar atrophy. Scattered acute infarcts within the left MCA and MCA/PCA watershed territories affecting the cortical/subcortical left frontal, parietal and occipital lobes. Multiple acute infarcts are also present within the left insula and left basal ganglia. Suspected small chronic cortical  infarct within the right occipital lobe (series 8, image 10). No evidence of intracranial mass. No chronic intracranial blood products. No extra-axial fluid collection. No midline shift. Vascular: Expected proximal arterial flow voids. SWI signal loss along the course of the M1 and M2 left MCA likely reflecting stent artifact. Skull and upper cervical spine: No focal marrow lesion. Sinuses/Orbits: Visualized orbits show no acute finding. Trace bilateral ethmoid and maxillary sinus mucosal thickening. MRA HEAD FINDINGS Anterior circulation: The intracranial internal carotid arteries are patent. Mild stent artifact from a stent within the M1/M2 left MCA. The M1 left middle cerebral artery is now patent. Atherosclerotic irregularity of the M2 and more distal left MCA vessels. Most notably, there are sites of apparent high-grade stenosis within an inferior division proximal left M2 vessel. No proximal left M2 branch occlusion is identified. The M1 right MCA is patent. Redemonstrated moderate stenosis within the proximal right M 1 segment. No right M2 proximal branch occlusion or high-grade proximal stenosis. The anterior cerebral arteries are patent. No intracranial aneurysm is identified. Posterior circulation: The intracranial vertebral arteries are patent. The basilar artery is patent. The posterior cerebral arteries are patent. Redemonstrated atherosclerotic irregularity of the bilateral posterior cerebral arteries, most notably as follows. Multifocal severe stenoses within the P2 right PCA. Sites of up to moderate/severe stenosis within the P2 left PCA. Posterior communicating arteries are hypoplastic or absent bilaterally. Anatomic variants: As described IMPRESSION: MRI brain: 1. Scattered acute infarcts within the left MCA and MCA/PCA watershed territories affecting the cortical/subcortical left frontal, parietal and occipital lobes. Multiple acute infarcts are also present within the left insula and basal  ganglia. No significant mass effect. No hemorrhagic conversion. 2. Suspected small chronic cortical infarct within the right occipital lobe. 3. Mild generalized parenchymal atrophy. MRA head: 1. Mild artifact arising from a stent within the M1/M2 left MCA. The M1 left MCA is now patent. Atherosclerotic irregularity of the M2 and more distal left MCA vessels. Most notably, there is apparent high-grade stenosis within an inferior division proximal left M2 vessel. 2. Redemonstrated moderate stenosis within the proximal M1 right MCA. 3. Redemonstrated sites of severe stenosis within the P2 right PCA.  4. Redemonstrated sites of up to moderate/severe stenosis within the P2 left PCA. Electronically Signed   By: Kellie Simmering DO   On: 12/17/2020 16:01   MR BRAIN WO CONTRAST  Result Date: 12/17/2020 CLINICAL DATA:  Stroke, follow-up. EXAM: MRI HEAD WITHOUT CONTRAST MRA HEAD WITHOUT CONTRAST TECHNIQUE: Multiplanar, multi-echo pulse sequences of the brain and surrounding structures were acquired without intravenous contrast. Angiographic images of the Circle of Willis were acquired using MRA technique without intravenous contrast. COMPARISON: No pertinent prior exam. COMPARISON:  Noncontrast head CT 12/16/2020. CT angiogram head/neck and CT perfusion 12/16/2020. FINDINGS: MRI HEAD FINDINGS Brain: Mild intermittent motion degradation. Mild cerebral and cerebellar atrophy. Scattered acute infarcts within the left MCA and MCA/PCA watershed territories affecting the cortical/subcortical left frontal, parietal and occipital lobes. Multiple acute infarcts are also present within the left insula and left basal ganglia. Suspected small chronic cortical infarct within the right occipital lobe (series 8, image 10). No evidence of intracranial mass. No chronic intracranial blood products. No extra-axial fluid collection. No midline shift. Vascular: Expected proximal arterial flow voids. SWI signal loss along the course of the M1 and  M2 left MCA likely reflecting stent artifact. Skull and upper cervical spine: No focal marrow lesion. Sinuses/Orbits: Visualized orbits show no acute finding. Trace bilateral ethmoid and maxillary sinus mucosal thickening. MRA HEAD FINDINGS Anterior circulation: The intracranial internal carotid arteries are patent. Mild stent artifact from a stent within the M1/M2 left MCA. The M1 left middle cerebral artery is now patent. Atherosclerotic irregularity of the M2 and more distal left MCA vessels. Most notably, there are sites of apparent high-grade stenosis within an inferior division proximal left M2 vessel. No proximal left M2 branch occlusion is identified. The M1 right MCA is patent. Redemonstrated moderate stenosis within the proximal right M 1 segment. No right M2 proximal branch occlusion or high-grade proximal stenosis. The anterior cerebral arteries are patent. No intracranial aneurysm is identified. Posterior circulation: The intracranial vertebral arteries are patent. The basilar artery is patent. The posterior cerebral arteries are patent. Redemonstrated atherosclerotic irregularity of the bilateral posterior cerebral arteries, most notably as follows. Multifocal severe stenoses within the P2 right PCA. Sites of up to moderate/severe stenosis within the P2 left PCA. Posterior communicating arteries are hypoplastic or absent bilaterally. Anatomic variants: As described IMPRESSION: MRI brain: 1. Scattered acute infarcts within the left MCA and MCA/PCA watershed territories affecting the cortical/subcortical left frontal, parietal and occipital lobes. Multiple acute infarcts are also present within the left insula and basal ganglia. No significant mass effect. No hemorrhagic conversion. 2. Suspected small chronic cortical infarct within the right occipital lobe. 3. Mild generalized parenchymal atrophy. MRA head: 1. Mild artifact arising from a stent within the M1/M2 left MCA. The M1 left MCA is now patent.  Atherosclerotic irregularity of the M2 and more distal left MCA vessels. Most notably, there is apparent high-grade stenosis within an inferior division proximal left M2 vessel. 2. Redemonstrated moderate stenosis within the proximal M1 right MCA. 3. Redemonstrated sites of severe stenosis within the P2 right PCA. 4. Redemonstrated sites of up to moderate/severe stenosis within the P2 left PCA. Electronically Signed   By: Kellie Simmering DO   On: 12/17/2020 16:01   IR CT Head Ltd  Result Date: 12/21/2020 INDICATION: New onset of aphasia, right-sided hemiplegia and left gaze deviation. 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 EXAM: 1. EMERGENT LARGE VESSEL OCCLUSION THROMBOLYSIS (anterior CIRCULATION) IR PERCUTANEOUS ART THORMBECTOMY/INFUSION  INTRACRANIAL INCLUDE DIAG ANGIO COMPARISON:  CT angiogram of the head and neck of Dec 16, 2020. MEDICATIONS: Ancef 2 g IV antibiotic was administered within 1 hour of the procedure. ANESTHESIA/SEDATION: General anesthesia. CONTRAST:  Omnipaque 300 120 mL. FLUOROSCOPY TIME:  Fluoroscopy Time: 76 minutes 18 seconds (2434 mGy). COMPLICATIONS: None immediate. TECHNIQUE: Following a full explanation of the procedure along with the potential associated complications, an informed witnessed consent was obtained. The risks of intracranial hemorrhage of 10%, worsening neurological deficit, ventilator dependency, death and inability to revascularize were all reviewed in detail with the patient's sister. The patient was then put under general anesthesia by the Department of Anesthesiology at [facility]. The right groin was prepped and draped in the usual sterile fashion. Thereafter using modified Seldinger technique, transfemoral access into the right common femoral artery was obtained without difficulty. Over a 0.035 inch guidewire an 8 [place] Pinnacle 25 cm sheath was inserted. Through this, and also over a 0.035 inch guidewire a 5  0.5 [place] JB 1 catheter inside of an 087 Emboguard balloon catheter was advanced to the aortic arch region and selectively positioned in the left common carotid artery. The 035 inch guidewire, and the support catheter were retrieved and removed. Good aspiration obtained from the hub of the Emboguard balloon guide catheter in the left common carotid artery. An arteriogram was performed centered extra cranially and intracranially. FINDINGS: The left common carotid arteriogram demonstrates the left external carotid artery and its major branches to be widely patent. The left internal carotid artery at the bulb to the cranial skull base is widely patent though associated with an O configuration tortuosity at the junction of the distal and the middle 1/3 of the left internal carotid artery. More distally the distal cervical left ICA at the petrous, cavernous and supraclinoid segments demonstrate wide patency. 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 The left anterior cerebral artery opacifies into the capillary and venous phases. PROCEDURE: The balloon guide catheter was advanced into the proximal 1/3 of the left internal carotid artery over a 0.035 inch Roadrunner guidewire. The guidewire was removed. Through this, a combination of an 027 136 cm Zoom aspiration catheter with an 021 162 cm Trevo ProVue microcatheter was advanced in combination over a 0.014 inch standard Synchro micro guidewire to the supraclinoid left ICA. The guidewire was then advanced using a torque device through the occluded left middle cerebral artery into an inferior branch M2 M3 region followed by the microcatheter. The 071 Zoom aspiration catheter was advanced and positioned in the occluded left middle cerebral artery. The guidewire was removed. Good aspiration was obtained from the hub of the microcatheter. A gentle control arteriogram performed through the microcatheter demonstrated  safe position of the tip of the microcatheter. This was then connected to continuous heparinized saline infusion. A Tiger 17 retrieval device was then prepped and purged submerged in heparinized saline infusion. This was then advanced in a coaxial manner and with constant heparinized saline infusion to the distal end of the microcatheter. The retrieval device was then deployed such that the proximal marker was at the proximal end of the occluded left middle cerebral artery with a Zoom aspiration catheter just proximal to this. The Tiger 17 device was then expanded and decreased in caliber over approximately 2 minutes. With constant aspiration at the hub of the Zoom aspiration catheter, using Penumbra aspiration device, and proximal flow arrest in the proximal left internal carotid artery over approximately  2 minutes, the combination of the retrieval device was retrieved as was the Zoom aspiration catheter. After flow reversal, control arteriogram performed through the balloon guide catheter in the left internal carotid artery demonstrated now flow through the left middle cerebral artery into the superior and inferior divisions revealing severe segmental stenosis of the left middle cerebral artery. Sticky clot was noted adhering to the retrieval device. A second pass was then made, using the same combination. The micro guidewire was again advanced into the inferior division M2 M3 region followed by the microcatheter. After having ascertained safe position of the tip of the microcatheter as described above, the Tiger 17 retrieval device was again deployed in the manner as described above. The proximal device was locked to the microcatheter at the proximal aspirate of the occlusion. Constant aspiration was then applied the hub of the balloon guide catheter in the left internal carotid artery with a 20 mL syringe, and Penumbra aspiration device at the hub of the Zoom aspiration catheter for approximately 2 minutes. The  retrieval device was expanded and decreased in caliber over approximately 2 minutes. The combination of the retrieval device, the microcatheter and the Zoom aspiration catheter were retrieved and removed. Free flow was noted from the Zoom aspiration catheter in the distal 1/3 of the left internal carotid artery. Again clot was seen within the retrieval device. Control arteriogram performed following reversal of flow arrest demonstrated now complete angiographic occlusion of the left middle cerebral artery. It was suspect reocclusion was probably related to underlying severe intracranial arteriosclerosis. Through the Zoom aspiration catheter in the cavernous left ICA, where a 0.014 inch standard Synchro micro guidewire, an 021 Trevo ProVue microcatheter was again advanced into the inferior division M2 M3 region. The guidewire was removed. Good aspiration obtained from the hub of the microcatheter. This was then connected to continuous heparinized saline infusion. A 4 mm x 40 mm Solitaire retrieval device was then advanced to the distal end of the microcatheter and deployed in the usual manner. This was left patent for approximately 3 minutes. Control arteriogram performed through the Zoom aspiration catheter in the supraclinoid left ICA demonstrated now flow through the retrieval device into the left MCA branches achieving a TICI 3 revascularization. Also noted again was a segmental severe stenosis of the mid to distal left middle cerebral artery due to intracranial arteriosclerosis. The retrieval device was recaptured into the microcatheter which was advanced slightly more distally into the M2 M3 regions. The retrieval device was removed. Good aspiration obtained from the hub of the microcatheter which was then connected to continuous heparinized saline infusion. It was elected to proceed with placement of an intracranial stent to prevent reocclusion. A 4 mm x 24 mm Neuroform ATLAS stent was selected. This was  prepped and purged in heparinized saline infusion and advanced in a coaxial manner to the distal end of the microcatheter. The proximal and the distal landing zones were identified. The O ring on the delivery microcatheter was loosened. With slight forward gentle traction with the right hand on the delivery micro guidewire, the distal and then the proximal portion of the stent was deployed. Control arteriogram performed through the Zoom aspiration catheter in the left internal carotid artery now demonstrated revascularization of the left middle cerebral artery and left anterior cerebral artery achieving a TICI 3 revascularization. A 2 mm x 9 mm Gateway balloon angioplasty microcatheter was then prepped and purged with heparinized saline infusion and 50% contrast and 50% heparinized saline infusion. Over a  0.014 inch standard Synchro micro guidewire, a balloon angioplasty catheter was advanced to the supraclinoid left ICA. With a torque device, the micro guidewire was advanced through the stent and into the inferior division left middle cerebral artery M2 M3 region. The balloon was then positioned with the proximal and distal markers covering the entirety of the severe stenosis. A control inflation was then performed via micro inflation syringe device via micro tubing to approximately 1.95 mm where it was maintained for approximately 30 seconds. Balloon was deflated and retrieved proximally. Control arteriogram performed through the balloon guide catheter in the left internal carotid artery demonstrates significantly improved caliber and flow through the left MCA stented segment and its distal branches. The left anterior cerebral artery remained patent with cross flow via the anterior communicating artery into the right anterior cerebral A2 segment. Control arteriogram was then performed in approximately 20 minutes which continued to demonstrate excellent flow through the left middle cerebral artery with  significantly increased caliber and hemodynamic flow into the left MCA distribution. The balloon guide catheter was retrieved and removed. The 8 French Pinnacle sheath was removed with hemostasis achieved in the right groin with a 7 [place] ExoSeal closure device and manual compression over 15 minutes. Distal pulses remained present in both feet at the end of the procedure. CT of the brain was performed prior to placement of the stent which demonstrated no evidence of intracranial hemorrhage. A second CT of the brain at the end of the procedure continued to demonstrate no evidence of intracranial hemorrhage or mass effect or midline shift. The patient was loaded with Brilinta 180 mg prior to the placement of the stent. A bolus dose of IV cangrelor was given at the time of placement of the stent, with a 4 hour low-dose infusion for 4 hours. Patient was left intubated on account of unresponsiveness prior to the intubation. His pupils were symmetrical though sluggish bilaterally. He was then transferred to the neuro PACU for post revascularization management. IMPRESSION: Status post endovascular revascularization of occluded left middle cerebral M1 segment with 2 passes with the Tiger 17 retrieval device and contact aspiration achieving a TICI 3 revascularization. 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 PLAN: Follow-up in the clinic 2 weeks post discharge. Electronically Signed   By: Luanne Bras M.D.   On: 12/17/2020 12:14   IR CT Head Ltd  Result Date: 12/21/2020 INDICATION: New onset of aphasia, right-sided hemiplegia and left gaze deviation. 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 EXAM: 1. EMERGENT LARGE VESSEL OCCLUSION THROMBOLYSIS (anterior CIRCULATION) IR PERCUTANEOUS ART THORMBECTOMY/INFUSION INTRACRANIAL INCLUDE DIAG ANGIO  COMPARISON:  CT angiogram of the head and neck of Dec 16, 2020. MEDICATIONS: Ancef 2 g IV antibiotic was administered within 1 hour of the procedure. ANESTHESIA/SEDATION: General anesthesia. CONTRAST:  Omnipaque 300 120 mL. FLUOROSCOPY TIME:  Fluoroscopy Time: 76 minutes 18 seconds (2434 mGy). COMPLICATIONS: None immediate. TECHNIQUE: Following a full explanation of the procedure along with the potential associated complications, an informed witnessed consent was obtained. The risks of intracranial hemorrhage of 10%, worsening neurological deficit, ventilator dependency, death and inability to revascularize were all reviewed in detail with the patient's sister. The patient was then put under general anesthesia by the Department of Anesthesiology at [facility]. The right groin was prepped and draped in the usual sterile fashion. Thereafter using modified Seldinger technique, transfemoral access  into the right common femoral artery was obtained without difficulty. Over a 0.035 inch guidewire an 8 [place] Pinnacle 25 cm sheath was inserted. Through this, and also over a 0.035 inch guidewire a 5 0.5 [place] JB 1 catheter inside of an 087 Emboguard balloon catheter was advanced to the aortic arch region and selectively positioned in the left common carotid artery. The 035 inch guidewire, and the support catheter were retrieved and removed. Good aspiration obtained from the hub of the Emboguard balloon guide catheter in the left common carotid artery. An arteriogram was performed centered extra cranially and intracranially. FINDINGS: The left common carotid arteriogram demonstrates the left external carotid artery and its major branches to be widely patent. The left internal carotid artery at the bulb to the cranial skull base is widely patent though associated with an O configuration tortuosity at the junction of the distal and the middle 1/3 of the left internal carotid artery. More distally the distal cervical  left ICA at the petrous, cavernous and supraclinoid segments demonstrate wide patency. 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 The left anterior cerebral artery opacifies into the capillary and venous phases. PROCEDURE: The balloon guide catheter was advanced into the proximal 1/3 of the left internal carotid artery over a 0.035 inch Roadrunner guidewire. The guidewire was removed. Through this, a combination of an 027 136 cm Zoom aspiration catheter with an 021 162 cm Trevo ProVue microcatheter was advanced in combination over a 0.014 inch standard Synchro micro guidewire to the supraclinoid left ICA. The guidewire was then advanced using a torque device through the occluded left middle cerebral artery into an inferior branch M2 M3 region followed by the microcatheter. The 071 Zoom aspiration catheter was advanced and positioned in the occluded left middle cerebral artery. The guidewire was removed. Good aspiration was obtained from the hub of the microcatheter. A gentle control arteriogram performed through the microcatheter demonstrated safe position of the tip of the microcatheter. This was then connected to continuous heparinized saline infusion. A Tiger 17 retrieval device was then prepped and purged submerged in heparinized saline infusion. This was then advanced in a coaxial manner and with constant heparinized saline infusion to the distal end of the microcatheter. The retrieval device was then deployed such that the proximal marker was at the proximal end of the occluded left middle cerebral artery with a Zoom aspiration catheter just proximal to this. The Tiger 17 device was then expanded and decreased in caliber over approximately 2 minutes. With constant aspiration at the hub of the Zoom aspiration catheter, using Penumbra aspiration device, and proximal flow arrest in the proximal left internal carotid artery over approximately 2 minutes, the  combination of the retrieval device was retrieved as was the Zoom aspiration catheter. After flow reversal, control arteriogram performed through the balloon guide catheter in the left internal carotid artery demonstrated now flow through the left middle cerebral artery into the superior and inferior divisions revealing severe segmental stenosis of the left middle cerebral artery. Sticky clot was noted adhering to the retrieval device. A second pass was then made, using the same combination. The micro guidewire was again advanced into the inferior division M2 M3 region followed by the microcatheter. After having ascertained safe position of the tip of the microcatheter as described above, the Tiger 17 retrieval device was again deployed in the manner as described above. The proximal device was locked to the microcatheter at the proximal aspirate of the  occlusion. Constant aspiration was then applied the hub of the balloon guide catheter in the left internal carotid artery with a 20 mL syringe, and Penumbra aspiration device at the hub of the Zoom aspiration catheter for approximately 2 minutes. The retrieval device was expanded and decreased in caliber over approximately 2 minutes. The combination of the retrieval device, the microcatheter and the Zoom aspiration catheter were retrieved and removed. Free flow was noted from the Zoom aspiration catheter in the distal 1/3 of the left internal carotid artery. Again clot was seen within the retrieval device. Control arteriogram performed following reversal of flow arrest demonstrated now complete angiographic occlusion of the left middle cerebral artery. It was suspect reocclusion was probably related to underlying severe intracranial arteriosclerosis. Through the Zoom aspiration catheter in the cavernous left ICA, where a 0.014 inch standard Synchro micro guidewire, an 021 Trevo ProVue microcatheter was again advanced into the inferior division M2 M3 region. The  guidewire was removed. Good aspiration obtained from the hub of the microcatheter. This was then connected to continuous heparinized saline infusion. A 4 mm x 40 mm Solitaire retrieval device was then advanced to the distal end of the microcatheter and deployed in the usual manner. This was left patent for approximately 3 minutes. Control arteriogram performed through the Zoom aspiration catheter in the supraclinoid left ICA demonstrated now flow through the retrieval device into the left MCA branches achieving a TICI 3 revascularization. Also noted again was a segmental severe stenosis of the mid to distal left middle cerebral artery due to intracranial arteriosclerosis. The retrieval device was recaptured into the microcatheter which was advanced slightly more distally into the M2 M3 regions. The retrieval device was removed. Good aspiration obtained from the hub of the microcatheter which was then connected to continuous heparinized saline infusion. It was elected to proceed with placement of an intracranial stent to prevent reocclusion. A 4 mm x 24 mm Neuroform ATLAS stent was selected. This was prepped and purged in heparinized saline infusion and advanced in a coaxial manner to the distal end of the microcatheter. The proximal and the distal landing zones were identified. The O ring on the delivery microcatheter was loosened. With slight forward gentle traction with the right hand on the delivery micro guidewire, the distal and then the proximal portion of the stent was deployed. Control arteriogram performed through the Zoom aspiration catheter in the left internal carotid artery now demonstrated revascularization of the left middle cerebral artery and left anterior cerebral artery achieving a TICI 3 revascularization. A 2 mm x 9 mm Gateway balloon angioplasty microcatheter was then prepped and purged with heparinized saline infusion and 50% contrast and 50% heparinized saline infusion. Over a 0.014 inch  standard Synchro micro guidewire, a balloon angioplasty catheter was advanced to the supraclinoid left ICA. With a torque device, the micro guidewire was advanced through the stent and into the inferior division left middle cerebral artery M2 M3 region. The balloon was then positioned with the proximal and distal markers covering the entirety of the severe stenosis. A control inflation was then performed via micro inflation syringe device via micro tubing to approximately 1.95 mm where it was maintained for approximately 30 seconds. Balloon was deflated and retrieved proximally. Control arteriogram performed through the balloon guide catheter in the left internal carotid artery demonstrates significantly improved caliber and flow through the left MCA stented segment and its distal branches. The left anterior cerebral artery remained patent with cross flow via the anterior communicating artery  into the right anterior cerebral A2 segment. Control arteriogram was then performed in approximately 20 minutes which continued to demonstrate excellent flow through the left middle cerebral artery with significantly increased caliber and hemodynamic flow into the left MCA distribution. The balloon guide catheter was retrieved and removed. The 8 French Pinnacle sheath was removed with hemostasis achieved in the right groin with a 7 [place] ExoSeal closure device and manual compression over 15 minutes. Distal pulses remained present in both feet at the end of the procedure. CT of the brain was performed prior to placement of the stent which demonstrated no evidence of intracranial hemorrhage. A second CT of the brain at the end of the procedure continued to demonstrate no evidence of intracranial hemorrhage or mass effect or midline shift. The patient was loaded with Brilinta 180 mg prior to the placement of the stent. A bolus dose of IV cangrelor was given at the time of placement of the stent, with a 4 hour low-dose infusion  for 4 hours. Patient was left intubated on account of unresponsiveness prior to the intubation. His pupils were symmetrical though sluggish bilaterally. He was then transferred to the neuro PACU for post revascularization management. IMPRESSION: Status post endovascular revascularization of occluded left middle cerebral M1 segment with 2 passes with the Tiger 17 retrieval device and contact aspiration achieving a TICI 3 revascularization. 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 PLAN: Follow-up in the clinic 2 weeks post discharge. Electronically Signed   By: Luanne Bras M.D.   On: 12/17/2020 12:14   CT CEREBRAL PERFUSION W CONTRAST  Result Date: 12/16/2020 CLINICAL DATA:  Right-sided deficits EXAM: CT ANGIOGRAPHY HEAD AND NECK CT PERFUSION BRAIN TECHNIQUE: Multidetector CT imaging of the head and neck was performed using the standard protocol during bolus administration of intravenous contrast. Multiplanar CT image reconstructions and MIPs were obtained to evaluate the vascular anatomy. Carotid stenosis measurements (when applicable) are obtained utilizing NASCET criteria, using the distal internal carotid diameter as the denominator. Multiphase CT imaging of the brain was performed following IV bolus contrast injection. Subsequent parametric perfusion maps were calculated using RAPID software. CONTRAST:  134mL OMNIPAQUE IOHEXOL 350 MG/ML SOLN COMPARISON:  None. FINDINGS: CTA NECK FINDINGS SKELETON: There is no bony spinal canal stenosis. No lytic or blastic lesion. OTHER NECK: Normal pharynx, larynx and major salivary glands. No cervical lymphadenopathy. Unremarkable thyroid gland. UPPER CHEST: No pneumothorax or pleural effusion. No nodules or masses. AORTIC ARCH: There is no calcific atherosclerosis of the aortic arch. There is no aneurysm, dissection or hemodynamically  significant stenosis of the visualized portion of the aorta. Conventional 3 vessel aortic branching pattern. The visualized proximal subclavian arteries are widely patent. RIGHT CAROTID SYSTEM: Normal without aneurysm, dissection or stenosis. LEFT CAROTID SYSTEM: Normal without aneurysm, dissection or stenosis. VERTEBRAL ARTERIES: Left dominant configuration. Both origins are clearly patent. There is no dissection, occlusion or flow-limiting stenosis to the skull base (V1-V3 segments). CTA HEAD FINDINGS POSTERIOR CIRCULATION: --Vertebral arteries: Normal V4 segments. --Inferior cerebellar arteries: Normal. --Basilar artery: Normal. --Superior cerebellar arteries: Normal. --Posterior cerebral arteries (PCA): Multifocal moderate-to-severe stenosis of the right P2 segment. Normal left. ANTERIOR CIRCULATION: --Intracranial internal carotid arteries: Normal. --Anterior cerebral arteries (ACA): Normal. Both A1 segments are present. Patent anterior communicating artery (a-comm). --Middle cerebral arteries (MCA): Complete occlusion of the left MCA at the proximal M1 segment with limited collateralization. Right MCA is normal. VENOUS SINUSES: As permitted by  contrast timing, patent. ANATOMIC VARIANTS: None Review of the MIP images confirms the above findings. CT Brain Perfusion Findings: ASPECTS: 9 CBF (<30%) Volume: 74mL Perfusion (Tmax>6.0s) volume: 123mL Mismatch Volume: 158mL, likely slightly overestimated due to small area of completed infarction seen on the noncontrast head CT. Infarction Location:Left MCA territory IMPRESSION: 1. 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 2. Large area of ischemic penumbra in the left MCA territory. 3. 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 Critical Value/emergent results were called by telephone at the time of interpretation on 12/16/2020 at 9:02 pm to  provider Christ Kick , who verbally acknowledged these results. Aortic Atherosclerosis (ICD10-I70.0). Electronically Signed   By: Ulyses Jarred M.D.   On: 12/16/2020 21:08   DG CHEST PORT 1 VIEW  Result Date: 12/17/2020 CLINICAL DATA:  Intubation. EXAM: PORTABLE CHEST 1 VIEW COMPARISON:  12/12/2020. FINDINGS: Endotracheal tube tip noted 4 cm above the carina. NG tube tip below left hemidiaphragm. Prior CABG. Heart size normal. Low lung volumes. Mild left base atelectasis/infiltrate cannot be excluded. Mild elevation left hemidiaphragm. No pleural effusion or pneumothorax. Prior cervical spine fusion. IMPRESSION: 1.  Endotracheal tube and NG tube in good anatomic position. 2.  Prior CABG.  Heart size normal. 3. Mild left base atelectasis/infiltrate cannot be excluded. Mild elevation left hemidiaphragm. Electronically Signed   By: Marcello Moores  Register   On: 12/17/2020 05:18   DG Chest Port 1 View  Result Date: 12/12/2020 CLINICAL DATA:  Patient complaining of headache and overall not feeling well since yesterday. States that he has not taken BP meds in one day. Reports generalized pain and believes he may have been running fever at home. EXAM: PORTABLE CHEST - 1 VIEW COMPARISON:  09/14/2020 FINDINGS: Lungs are clear.  Previous CABG. Heart size and mediastinal contours are within normal limits. Aortic Atherosclerosis (ICD10-170.0). No effusion.  No pneumothorax. Visualized bones unremarkable. IMPRESSION: No acute cardiopulmonary disease post CABG. Electronically Signed   By: Lucrezia Europe M.D.   On: 12/12/2020 11:48   EEG adult  Result Date: 12/17/2020 Lora Havens, MD     12/17/2020  1:00 PM Patient Name: Melvin Taylor MRN: [id number] Epilepsy Attending: Lora Havens Referring Physician/Provider: Dr. 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  EEG  to evaluate for seizures. Level of alertness: Awake AEDs during EEG study: Lamotrigine, Depakote Technical aspects: This EEG study was done with scalp electrodes positioned according to the 10-20 International system of electrode placement. Electrical activity was acquired at a sampling rate of 500Hz  and reviewed with a high frequency filter of 70Hz  and a low frequency filter of 1Hz . EEG data were recorded continuously and digitally stored. Description: The posterior dominant rhythm consists of 8 Hz activity of moderate voltage (25-35 uV) seen predominantly in posterior head regions, symmetric and reactive to eye opening and eye closing. EEG showed intermittent 3 to 6 Hz theta-delta slowing in left hemisphere.  Photic driving was not seen during photic stimulation. Hyperventilation was not performed.   ABNORMALITY - Intermittent slow, left hemisphere IMPRESSION: This study is suggestive of cortical dysfunction arising from left hemisphere, nonspecific etiology. No seizures or epileptiform discharges were seen throughout the recording. Lora Havens   VAS [place] GROIN PSEUDOANEURYSM  Result Date: 12/19/2020  ARTERIAL PSEUDOANEURYSM  Patient Name:  SEDRIC  D Robinson  Date of Exam:   12/19/2020 Medical Rec #: [id number]          Accession #:    [id number] Date of Birth: 1965-12-25           Patient Gender: M Patient Age:   54Y Exam Location:  [facility] Procedure:      VAS [place] Gloriajean Dell Referring Phys: [id number] [place] --------------------------------------------------------------------------------  Exam: Right groin Indications: Decreased hematocrit. History: S/p right groin access. Comparison Study: No prior study Performing Technologist: Maudry Mayhew MHA, RDMS, RVT, RDCS  Examination Guidelines: A complete evaluation includes B-mode imaging, spectral Doppler, color Doppler, and power Doppler as needed of all accessible portions of each vessel. Bilateral testing is considered an  integral part of a complete examination. Limited examinations for reoccurring indications may be performed as noted. +------------+----------+--------+------+----------+ Right DuplexPSV (cm/s)WaveformPlaqueComment(s) +------------+----------+--------+------+----------+ CFA            224                             +------------+----------+--------+------+----------+ Prox SFA       159                             +------------+----------+--------+------+----------+  Summary: No evidence of pseudoaneurysm, AVF or DVT    --------------------------------------------------------------------------------    Preliminary    ECHOCARDIOGRAM COMPLETE  Result Date: 12/17/2020    ECHOCARDIOGRAM REPORT   Patient Name:   ADISON LAROWE Date of Exam: 12/17/2020 Medical Rec #:  [id number]         Height:       67.0 in Accession #:    [id number]        Weight:       157.0 lb Date of Birth:  [birth year]          BSA:          1.825 m Patient Age:    2 years          BP:           96/70 mmHg Patient Gender: M                 HR:           60 bpm. Exam Location:  Inpatient Procedure: 2D Echo, Cardiac Doppler and Color Doppler Indications:    CVA  History:        Patient has prior history of Echocardiogram examinations, most                 recent 03/04/2019. Previous Myocardial Infarction and CAD, Prior                 CABG, Signs/Symptoms:Shortness of Breath; Risk Factors:Diabetes.                 03/28/2016 CABG                 03/07/2016 cath.  Sonographer:    Luisa Hart RDCS Referring Phys: [id number] Lorenza Chick  Sonographer Comments: Echo performed with patient supine and on artificial respirator. IMPRESSIONS  1. Left ventricular ejection fraction, by estimation, is 65 to 70%. The left ventricle has normal function. The left ventricle has no regional wall motion abnormalities. There is moderate left ventricular hypertrophy. Left ventricular diastolic parameters are consistent with Grade I diastolic dysfunction  (impaired relaxation).  2. Right ventricular systolic  function is normal. The right ventricular size is normal. There is normal pulmonary artery systolic pressure. The estimated right ventricular systolic pressure is [id number] mmHg.  3. The mitral valve is normal in structure. Trivial mitral valve regurgitation. No evidence of mitral stenosis.  4. The aortic valve is normal in structure. Aortic valve regurgitation is not visualized. No aortic stenosis is present.  5. Aortic dilatation noted. There is borderline dilatation of the ascending aorta, measuring 38 mm.  6. The inferior vena cava is normal in size with greater than 50% respiratory variability, suggesting right atrial pressure of 3 mmHg. Conclusion(s)/Recommendation(s): No intracardiac source of embolism detected on this transthoracic study. A transesophageal echocardiogram is recommended to exclude cardiac source of embolism if clinically indicated. FINDINGS  Left Ventricle: Left ventricular ejection fraction, by estimation, is 65 to 70%. The left ventricle has normal function. The left ventricle has no regional wall motion abnormalities. The left ventricular internal cavity size was normal in size. There is  moderate left ventricular hypertrophy. Left ventricular diastolic parameters are consistent with Grade I diastolic dysfunction (impaired relaxation). Right Ventricle: The right ventricular size is normal. No increase in right ventricular wall thickness. Right ventricular systolic function is normal. There is normal pulmonary artery systolic pressure. The tricuspid regurgitant velocity is 2.14 m/s, and  with an assumed right atrial pressure of 3 mmHg, the estimated right ventricular systolic pressure is [id number] mmHg. Left Atrium: Left atrial size was normal in size. Right Atrium: Right atrial size was normal in size. Pericardium: There is no evidence of pericardial effusion. Mitral Valve: The mitral valve is normal in structure. Trivial mitral valve  regurgitation. No evidence of mitral valve stenosis. MV peak gradient, 8.0 mmHg. The mean mitral valve gradient is 3.0 mmHg. Tricuspid Valve: The tricuspid valve is normal in structure. Tricuspid valve regurgitation is mild . No evidence of tricuspid stenosis. Aortic Valve: The aortic valve is normal in structure. Aortic valve regurgitation is not visualized. No aortic stenosis is present. Aortic valve mean gradient measures 3.0 mmHg. Aortic valve peak gradient measures 6.2 mmHg. Aortic valve area, by VTI measures 3.55 cm. Pulmonic Valve: The pulmonic valve was thickened with good excursion. Pulmonic valve regurgitation is mild. No evidence of pulmonic stenosis. Aorta: Aortic dilatation noted. There is borderline dilatation of the ascending aorta, measuring 38 mm. Venous: The inferior vena cava is normal in size with greater than 50% respiratory variability, suggesting right atrial pressure of 3 mmHg. IAS/Shunts: There is redundancy of the interatrial septum. No atrial level shunt detected by color flow Doppler.  LEFT VENTRICLE PLAX 2D LVIDd:         3.50 cm  Diastology LVIDs:         1.70 cm  LV e' medial:    7.40 cm/s LV PW:         1.50 cm  LV E/e' medial:  11.9 LV IVS:        1.50 cm  LV e' lateral:   9.36 cm/s LVOT diam:     2.20 cm  LV E/e' lateral: 9.4 LV SV:         95 LV SV Index:   52 LVOT Area:     3.80 cm  RIGHT VENTRICLE RV Basal diam:  4.30 cm RV Mid diam:    2.60 cm LEFT ATRIUM             Index       RIGHT ATRIUM           Index  LA diam:        3.20 cm 1.75 cm/m  RA Area:     11.60 cm LA Vol (A2C):   24.9 ml 13.65 ml/m RA Volume:   26.10 ml  14.30 ml/m LA Vol (A4C):   33.7 ml 18.47 ml/m LA Biplane Vol: 30.6 ml 16.77 ml/m  AORTIC VALVE                   PULMONIC VALVE AV Area (Vmax):    3.25 cm    PV Vmax:       0.76 m/s AV Area (Vmean):   3.28 cm    PV Vmean:      50.700 cm/s AV Area (VTI):     3.55 cm    PV VTI:        0.159 m AV Vmax:           125.00 cm/s PV Peak grad:  2.3 mmHg AV  Vmean:          83.600 cm/s PV Mean grad:  1.0 mmHg AV VTI:            0.269 m AV Peak Grad:      6.2 mmHg AV Mean Grad:      3.0 mmHg LVOT Vmax:         107.00 cm/s LVOT Vmean:        72.200 cm/s LVOT VTI:          0.251 m LVOT/AV VTI ratio: 0.93  AORTA Ao Root diam: 3.70 cm Ao Asc diam:  3.80 cm MITRAL VALVE                TRICUSPID VALVE MV Area (PHT): 2.76 cm     TR Peak grad:   18.3 mmHg MV Area VTI:   2.07 cm     TR Vmax:        214.00 cm/s MV Peak grad:  8.0 mmHg MV Mean grad:  3.0 mmHg     SHUNTS MV Vmax:       1.41 m/s     Systemic VTI:  0.25 m MV Vmean:      80.8 cm/s    Systemic Diam: 2.20 cm MV Decel Time: 275 msec MV E velocity: 88.10 cm/s MV A velocity: 109.00 cm/s MV E/A ratio:  0.81 Cherlynn Kaiser MD Electronically signed by Cherlynn Kaiser MD Signature Date/Time: 12/17/2020/1:53:18 PM    Final    IR PERCUTANEOUS ART THROMBECTOMY/INFUSION INTRACRANIAL INC DIAG ANGIO  Result Date: 12/21/2020 INDICATION: New onset of aphasia, right-sided hemiplegia and left gaze deviation. 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 EXAM: 1. EMERGENT LARGE VESSEL OCCLUSION THROMBOLYSIS (anterior CIRCULATION) IR PERCUTANEOUS ART THORMBECTOMY/INFUSION INTRACRANIAL INCLUDE DIAG ANGIO COMPARISON:  CT angiogram of the head and neck of Dec 16, 2020. MEDICATIONS: Ancef 2 g IV antibiotic was administered within 1 hour of the procedure. ANESTHESIA/SEDATION: General anesthesia. CONTRAST:  Omnipaque 300 120 mL. FLUOROSCOPY TIME:  Fluoroscopy Time: 76 minutes 18 seconds (2434 mGy). COMPLICATIONS: None immediate. TECHNIQUE: Following a full explanation of the procedure along with the potential associated complications, an informed witnessed consent was obtained. The risks of intracranial hemorrhage of 10%, worsening neurological deficit, ventilator dependency, death and inability to revascularize were all reviewed in detail with the patient's sister. The patient was then put under  general anesthesia by the Department of Anesthesiology at [facility]. The right groin was prepped and draped in the usual sterile fashion.  Thereafter using modified Seldinger technique, transfemoral access into the right common femoral artery was obtained without difficulty. Over a 0.035 inch guidewire an 8 [place] Pinnacle 25 cm sheath was inserted. Through this, and also over a 0.035 inch guidewire a 5 0.5 [place] JB 1 catheter inside of an 087 Emboguard balloon catheter was advanced to the aortic arch region and selectively positioned in the left common carotid artery. The 035 inch guidewire, and the support catheter were retrieved and removed. Good aspiration obtained from the hub of the Emboguard balloon guide catheter in the left common carotid artery. An arteriogram was performed centered extra cranially and intracranially. FINDINGS: The left common carotid arteriogram demonstrates the left external carotid artery and its major branches to be widely patent. The left internal carotid artery at the bulb to the cranial skull base is widely patent though associated with an O configuration tortuosity at the junction of the distal and the middle 1/3 of the left internal carotid artery. More distally the distal cervical left ICA at the petrous, cavernous and supraclinoid segments demonstrate wide patency. 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 The left anterior cerebral artery opacifies into the capillary and venous phases. PROCEDURE: The balloon guide catheter was advanced into the proximal 1/3 of the left internal carotid artery over a 0.035 inch Roadrunner guidewire. The guidewire was removed. Through this, a combination of an 027 136 cm Zoom aspiration catheter with an 021 162 cm Trevo ProVue microcatheter was advanced in combination over a 0.014 inch standard Synchro micro guidewire to the supraclinoid left ICA. The guidewire was then advanced  using a torque device through the occluded left middle cerebral artery into an inferior branch M2 M3 region followed by the microcatheter. The 071 Zoom aspiration catheter was advanced and positioned in the occluded left middle cerebral artery. The guidewire was removed. Good aspiration was obtained from the hub of the microcatheter. A gentle control arteriogram performed through the microcatheter demonstrated safe position of the tip of the microcatheter. This was then connected to continuous heparinized saline infusion. A Tiger 17 retrieval device was then prepped and purged submerged in heparinized saline infusion. This was then advanced in a coaxial manner and with constant heparinized saline infusion to the distal end of the microcatheter. The retrieval device was then deployed such that the proximal marker was at the proximal end of the occluded left middle cerebral artery with a Zoom aspiration catheter just proximal to this. The Tiger 17 device was then expanded and decreased in caliber over approximately 2 minutes. With constant aspiration at the hub of the Zoom aspiration catheter, using Penumbra aspiration device, and proximal flow arrest in the proximal left internal carotid artery over approximately 2 minutes, the combination of the retrieval device was retrieved as was the Zoom aspiration catheter. After flow reversal, control arteriogram performed through the balloon guide catheter in the left internal carotid artery demonstrated now flow through the left middle cerebral artery into the superior and inferior divisions revealing severe segmental stenosis of the left middle cerebral artery. Sticky clot was noted adhering to the retrieval device. A second pass was then made, using the same combination. The micro guidewire was again advanced into the inferior division M2 M3 region followed by the microcatheter. After having ascertained safe position of the tip of the microcatheter as described above, the  Tiger 17 retrieval device was again deployed in the manner as described above. The proximal device was locked to the  microcatheter at the proximal aspirate of the occlusion. Constant aspiration was then applied the hub of the balloon guide catheter in the left internal carotid artery with a 20 mL syringe, and Penumbra aspiration device at the hub of the Zoom aspiration catheter for approximately 2 minutes. The retrieval device was expanded and decreased in caliber over approximately 2 minutes. The combination of the retrieval device, the microcatheter and the Zoom aspiration catheter were retrieved and removed. Free flow was noted from the Zoom aspiration catheter in the distal 1/3 of the left internal carotid artery. Again clot was seen within the retrieval device. Control arteriogram performed following reversal of flow arrest demonstrated now complete angiographic occlusion of the left middle cerebral artery. It was suspect reocclusion was probably related to underlying severe intracranial arteriosclerosis. Through the Zoom aspiration catheter in the cavernous left ICA, where a 0.014 inch standard Synchro micro guidewire, an 021 Trevo ProVue microcatheter was again advanced into the inferior division M2 M3 region. The guidewire was removed. Good aspiration obtained from the hub of the microcatheter. This was then connected to continuous heparinized saline infusion. A 4 mm x 40 mm Solitaire retrieval device was then advanced to the distal end of the microcatheter and deployed in the usual manner. This was left patent for approximately 3 minutes. Control arteriogram performed through the Zoom aspiration catheter in the supraclinoid left ICA demonstrated now flow through the retrieval device into the left MCA branches achieving a TICI 3 revascularization. Also noted again was a segmental severe stenosis of the mid to distal left middle cerebral artery due to intracranial arteriosclerosis. The retrieval device was  recaptured into the microcatheter which was advanced slightly more distally into the M2 M3 regions. The retrieval device was removed. Good aspiration obtained from the hub of the microcatheter which was then connected to continuous heparinized saline infusion. It was elected to proceed with placement of an intracranial stent to prevent reocclusion. A 4 mm x 24 mm Neuroform ATLAS stent was selected. This was prepped and purged in heparinized saline infusion and advanced in a coaxial manner to the distal end of the microcatheter. The proximal and the distal landing zones were identified. The O ring on the delivery microcatheter was loosened. With slight forward gentle traction with the right hand on the delivery micro guidewire, the distal and then the proximal portion of the stent was deployed. Control arteriogram performed through the Zoom aspiration catheter in the left internal carotid artery now demonstrated revascularization of the left middle cerebral artery and left anterior cerebral artery achieving a TICI 3 revascularization. A 2 mm x 9 mm Gateway balloon angioplasty microcatheter was then prepped and purged with heparinized saline infusion and 50% contrast and 50% heparinized saline infusion. Over a 0.014 inch standard Synchro micro guidewire, a balloon angioplasty catheter was advanced to the supraclinoid left ICA. With a torque device, the micro guidewire was advanced through the stent and into the inferior division left middle cerebral artery M2 M3 region. The balloon was then positioned with the proximal and distal markers covering the entirety of the severe stenosis. A control inflation was then performed via micro inflation syringe device via micro tubing to approximately 1.95 mm where it was maintained for approximately 30 seconds. Balloon was deflated and retrieved proximally. Control arteriogram performed through the balloon guide catheter in the left internal carotid artery demonstrates  significantly improved caliber and flow through the left MCA stented segment and its distal branches. The left anterior cerebral artery remained patent with  cross flow via the anterior communicating artery into the right anterior cerebral A2 segment. Control arteriogram was then performed in approximately 20 minutes which continued to demonstrate excellent flow through the left middle cerebral artery with significantly increased caliber and hemodynamic flow into the left MCA distribution. The balloon guide catheter was retrieved and removed. The 8 [place] Pinnacle sheath was removed with hemostasis achieved in the right groin with a 7 [place] ExoSeal closure device and manual compression over 15 minutes. Distal pulses remained present in both feet at the end of the procedure. CT of the brain was performed prior to placement of the stent which demonstrated no evidence of intracranial hemorrhage. A second CT of the brain at the end of the procedure continued to demonstrate no evidence of intracranial hemorrhage or mass effect or midline shift. The patient was loaded with Brilinta 180 mg prior to the placement of the stent. A bolus dose of IV cangrelor was given at the time of placement of the stent, with a 4 hour low-dose infusion for 4 hours. Patient was left intubated on account of unresponsiveness prior to the intubation. His pupils were symmetrical though sluggish bilaterally. He was then transferred to the neuro PACU for post revascularization management. IMPRESSION: Status post endovascular revascularization of occluded left middle cerebral M1 segment with 2 passes with the Tiger 17 retrieval device and contact aspiration achieving a TICI 3 revascularization. 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 PLAN: Follow-up in the clinic 2 weeks post discharge. Electronically Signed   By: Luanne Bras M.D.   On: 12/17/2020 12:14   CT HEAD CODE STROKE WO CONTRAST  Result Date: 12/16/2020 CLINICAL DATA:  Code stroke.  Right-sided deficits EXAM: CT HEAD WITHOUT CONTRAST TECHNIQUE: Contiguous axial images were obtained from the base of the skull through the vertex without intravenous contrast. COMPARISON:  12/12/2020 FINDINGS: Brain: There is no mass, hemorrhage or extra-axial collection. The size and configuration of the ventricles and extra-axial CSF spaces are normal. Area of hypoattenuation in the inferior left frontal lobe, likely early subacute infarct. Small vessel infarcts in the left corona radiata are unchanged. Vascular: No abnormal hyperdensity of the major intracranial arteries or dural venous sinuses. No intracranial atherosclerosis. Skull: The visualized skull base, calvarium and extracranial soft tissues are normal. Sinuses/Orbits: No fluid levels or advanced mucosal thickening of the visualized paranasal sinuses. No mastoid or middle ear effusion. The orbits are normal. ASPECTS [facility] Stroke Program Early CT Score) - Ganglionic level infarction (caudate, lentiform nuclei, internal capsule, insula, M1-M3 cortex): 6 - Supraganglionic infarction (M4-M6 cortex): 3 Total score (0-10 with 10 being normal): 9 IMPRESSION: 1. No acute intracranial hemorrhage. 2. Suspected early subacute infarct of the inferior left frontal lobe. 3. ASPECTS is 9. These results were called by telephone at the time of interpretation on 12/16/2020 at 8:25 pm to provider [facility] , who verbally acknowledged these results. Electronically Signed   By: Ulyses Jarred M.D.   On: 12/16/2020 20:26   CT ANGIO HEAD CODE STROKE  Result Date: 12/16/2020 CLINICAL DATA:  Right-sided deficits EXAM: CT ANGIOGRAPHY HEAD AND NECK CT PERFUSION BRAIN TECHNIQUE: Multidetector CT imaging of the head and neck was performed using the standard protocol during bolus administration of intravenous contrast. Multiplanar CT image  reconstructions and MIPs were obtained to evaluate the vascular anatomy. Carotid stenosis measurements (when applicable) are obtained utilizing NASCET criteria, using the distal internal carotid diameter as  the denominator. Multiphase CT imaging of the brain was performed following IV bolus contrast injection. Subsequent parametric perfusion maps were calculated using RAPID software. CONTRAST:  OMNIPAQUE IOHEXOL 350 MG/ML SOLN COMPARISON:  None. FINDINGS: CTA NECK FINDINGS SKELETON: There is no bony spinal canal stenosis. No lytic or blastic lesion. OTHER NECK: Normal pharynx, larynx and major salivary glands. No cervical lymphadenopathy. Unremarkable thyroid gland. UPPER CHEST: No pneumothorax or pleural effusion. No nodules or masses. AORTIC ARCH: There is no calcific atherosclerosis of the aortic arch. There is no aneurysm, dissection or hemodynamically significant stenosis of the visualized portion of the aorta. Conventional 3 vessel aortic branching pattern. The visualized proximal subclavian arteries are widely patent. RIGHT CAROTID SYSTEM: Normal without aneurysm, dissection or stenosis. LEFT CAROTID SYSTEM: Normal without aneurysm, dissection or stenosis. VERTEBRAL ARTERIES: Left dominant configuration. Both origins are clearly patent. There is no dissection, occlusion or flow-limiting stenosis to the skull base (V1-V3 segments). CTA HEAD FINDINGS POSTERIOR CIRCULATION: --Vertebral arteries: Normal V4 segments. --Inferior cerebellar arteries: Normal. --Basilar artery: Normal. --Superior cerebellar arteries: Normal. --Posterior cerebral arteries (PCA): Multifocal moderate-to-severe stenosis of the right P2 segment. Normal left. ANTERIOR CIRCULATION: --Intracranial internal carotid arteries: Normal. --Anterior cerebral arteries (ACA): Normal. Both A1 segments are present. Patent anterior communicating artery (a-comm). --Middle cerebral arteries (MCA): Complete occlusion of the left MCA at the proximal M1  segment with limited collateralization. Right MCA is normal. VENOUS SINUSES: As permitted by contrast timing, patent. ANATOMIC VARIANTS: None Review of the MIP images confirms the above findings. CT Brain Perfusion Findings: ASPECTS: 9 CBF (<30%) Volume: 46mL Perfusion (Tmax>6.0s) volume: Mismatch Volume: , likely slightly overestimated due to small area of completed infarction seen on the noncontrast head CT. Infarction Location:Left MCA territory IMPRESSION: 1. 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 2. Large area of ischemic penumbra in the left MCA territory. 3. 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 Critical Value/emergent results were called by telephone at the time of interpretation on 12/16/2020 at 9:02 pm to provider Gray Bernhardt , who verbally acknowledged these results. Aortic Atherosclerosis (ICD10-I70.0). Electronically Signed   By: Deatra Robinson M.D.   On: 12/16/2020 21:08   CT ANGIO NECK CODE STROKE  Result Date: 12/16/2020 CLINICAL DATA:  Right-sided deficits EXAM: CT ANGIOGRAPHY HEAD AND NECK CT PERFUSION BRAIN TECHNIQUE: Multidetector CT imaging of the head and neck was performed using the standard protocol during bolus administration of intravenous contrast. Multiplanar CT image reconstructions and MIPs were obtained to evaluate the vascular anatomy. Carotid stenosis measurements (when applicable) are obtained utilizing NASCET criteria, using the distal internal carotid diameter as the denominator. Multiphase CT imaging of the brain was performed following IV bolus contrast injection. Subsequent parametric perfusion maps were calculated using RAPID software. CONTRAST:  OMNIPAQUE IOHEXOL 350 MG/ML SOLN COMPARISON:  None. FINDINGS: CTA NECK FINDINGS SKELETON: There is no bony spinal canal stenosis. No lytic or blastic lesion. OTHER NECK: Normal  pharynx, larynx and major salivary glands. No cervical lymphadenopathy. Unremarkable thyroid gland. UPPER CHEST: No pneumothorax or pleural effusion. No nodules or masses. AORTIC ARCH: There is no calcific atherosclerosis of the aortic arch. There is no aneurysm, dissection or hemodynamically significant stenosis of the visualized portion of the aorta. Conventional 3 vessel aortic branching pattern. The visualized proximal subclavian arteries are widely patent. RIGHT CAROTID SYSTEM: Normal without aneurysm, dissection or stenosis. LEFT CAROTID SYSTEM: Normal without aneurysm, dissection or stenosis. VERTEBRAL  ARTERIES: Left dominant configuration. Both origins are clearly patent. There is no dissection, occlusion or flow-limiting stenosis to the skull base (V1-V3 segments). CTA HEAD FINDINGS POSTERIOR CIRCULATION: --Vertebral arteries: Normal V4 segments. --Inferior cerebellar arteries: Normal. --Basilar artery: Normal. --Superior cerebellar arteries: Normal. --Posterior cerebral arteries (PCA): Multifocal moderate-to-severe stenosis of the right P2 segment. Normal left. ANTERIOR CIRCULATION: --Intracranial internal carotid arteries: Normal. --Anterior cerebral arteries (ACA): Normal. Both A1 segments are present. Patent anterior communicating artery (a-comm). --Middle cerebral arteries (MCA): Complete occlusion of the left MCA at the proximal M1 segment with limited collateralization. Right MCA is normal. VENOUS SINUSES: As permitted by contrast timing, patent. ANATOMIC VARIANTS: None Review of the MIP images confirms the above findings. CT Brain Perfusion Findings: ASPECTS: 9 CBF (<30%) Volume: 44mL Perfusion (Tmax>6.0s) volume: 187mL Mismatch Volume: 140mL, likely slightly overestimated due to small area of completed infarction seen on the noncontrast head CT. Infarction Location:Left MCA territory IMPRESSION: 1. Emergent large vessel occlusion of the left middle cerebral artery at the proximal M1 segment with  limited collateralization. 2. Large area of ischemic penumbra in the left MCA territory. 3. 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 Critical Value/emergent results were called by telephone at the time of interpretation on 12/16/2020 at 9:02 pm to provider Christ Kick , who verbally acknowledged these results. Aortic Atherosclerosis (ICD10-I70.0). Electronically Signed   By: Ulyses Jarred M.D.   On: 12/16/2020 21:08    HISTORY OF PRESENT ILLNESS Mr. CHICO TARANTO is a 55 y.o. male with history of seizure on Depakote and Lamictal, CAD status post CABG, diabetes, hypertension, hyperlipidemia, OSA, stage 3 CKD, substance abuse admitted to OSH for seizure at , right-sided weakness and slurred speech. NIH 14. No tPA given due to outside window. CTA with left M1 occlusion, CT perfusion with 160 cc at risk (slightly overestimated given dry head CT showed hypodensity that was included as area at risk in RAPID analysis).  Code IR activated and transferred to  [facility] for emergent thrombectomy.  [place] was taken emergently for thrombectomy by Dr. Estanislado Pandy. He tolerated the procedure well. Admitted to the neurologic ICU for post neurovascular intervention intensive monitoring and care. There he progressed well.    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   CT repeat left MCA infarct with trace SAH  MRI scattered left MCA infarcts. small chronic cortical infarct within the right occipital lobe.  MRA left M1 patent, high-grade stenosis inferior left M2, moderate stenosis right M1, severe stenosis, bilateral P2.  2D Echo  EF 65-70%  LDL 116  HgbA1c 9.7  Lovenox for VTE prophylaxis  clopidogrel 75 mg daily prior to admission, now  on Brilinta (ticagrelor) 90 mg bid post stent, patient has allergy to aspirin.  Patient counseled to be compliant with his antithrombotic medications   Possible pseudoanerysm at groin site: check ultrasound today, Hgb up to 10.5 and HCT 30 on 12 noon check.   Ongoing aggressive stroke risk factor management  Therapy recommendations: CIR, patient was initially reluctant but has now agreed, his sister, [name], is on board with this plan and will encourage patient to seek admission at [place] per our phone conversation today  Disposition: CIR  Seizure  History of seizure on Depakote and Lamictal  Recent breakthrough seizure  On Depakote 1500 twice daily PTA -> 1000 bid  lamictal 50 twice daily   EEG no seizure  Depakote level 39->150->52, continue depakote 1000mg  bid  depakote level daily  Diabetes Hypoglycemia 5/20  HgbA1c 9.7 goal < 7.0  Uncontrolled  Hypoglycemia x one on 5/20  CBG monitoring stable past 24 hours   SSI  DM coordinator consulted, appreciate recs  DM education and close PCP follow up  Hypertension  Stable  BP goal 120-140 within 24h of IR  Long term BP goal 130-150 given intracranial stenosis  Hyperlipidemia  Home meds: Lipitor 40  LDL 116, goal < 70  Now on Lipitor 80  Continue statin at discharge   Tobacco abuse  Current smoker  Smoking cessation counseling provided  Pt is willing to quit  AKI, stage3 CKD Urine retention  Cre 1.33->2.2->1.74->1.75 (baseline seems to be 1.5-1.7)  Continue gentle hydration with MIVF  Foley placed 5/20  Flomax on board  Consider voiding trial around 5/25  Other Stroke Risk Factors  UDS positive THC, cessation education provided  ETOH use  Hx stroke/TIA - old infarct on MRI  Coronary artery disease status post CABG  Obstructive sleep apnea, on CPAP at home  DISCHARGE EXAM Blood pressure (!) 144/74, pulse (!) 58, temperature 98.3 F (36.8 C), temperature source Oral,  resp. rate 18, height 5\' 7"  (1.702 m), weight 61.3 kg, SpO2 100 %.  General - Well nourished, well developed, Sitting up in chair at bedside in NAD.   Ophthalmologic - fundi not visualized due to noncooperation.  Cardiovascular - Regular rhythm and bradycardia.  Neuro - awake, alert, eyes open, oriented to people, age and self, but not to time.  Slightly nonfluent speech with occasional low paraphasic errors with mild perseveration, mild dysarthria, following all simple commands. Able to repeat simple sentences but complex sentences, able to name 3/3. No gaze palsy, tracking bilaterally, visual field full, PERRL. No facial droop. Tongue midline. LUE no drift, RUE pronator drift, but finger grip 4/5 bilaterally. BLE 3/5 proximal, distally left 5/5 and right 4/5. Sensation symmetrical bilaterally, b/l FTN intact grossly, gait not tested.   Discharge Diet      Diet   Diet heart healthy/carb modified Room service appropriate? Yes with Assist; Fluid consistency: Thin   liquids  DISCHARGE PLAN  Disposition:  Transfer to [facility] for ongoing PT, OT and ST  Now on Brilinta (ticagrelor) 90 mg bid post stent, patient has allergy to aspirin.  Recommend ongoing stroke risk factor control by Primary Care Physician at time of discharge from inpatient rehabilitation.  Follow-up PCP Melvin Robert, FNP in 2 weeks following discharge from rehab.  Follow-up in [facility] in 4 weeks following discharge from rehab, office to schedule an appointment.   35 minutes were spent preparing discharge.  Delila Glenna Fellows, NP-C  I have personally obtained history,examined this patient, reviewed notes, independently viewed imaging studies, participated in medical decision making and plan of care.ROS completed by me personally and pertinent positives fully documented  I have made any additions or clarifications directly to the above note. Agree with  note above.    Antony Contras, MD Medical Director [facility] Pager: [phone] 12/21/2020 4:40 PM

## 2020-12-21 NOTE — Progress Notes (Signed)
Physical Therapy Treatment Patient Details Name: Melvin Taylor MRN: [id number] DOB: [birth year] Today's Date: 12/21/2020    History of Present Illness Pt is a 55 y/o male brought in 5/17 by EMS for code stroke. Witnessed seizure by family. Transferred to [facility] from [place]. S/p thrombectomy/IR for L M1 occlusion 5/19. MRI + scattered acute infarcts L MCA and PCA watershed territories. Mulitple acute infarcts within the L insula and basal ganglia. PMH significant for DM, HTN, seizures, CAD, polysubstance abuse, CKD stage 3, BiPolar disorder and schizophrenia.    PT Comments    Pt progressing towards physical therapy goals. Was able to tolerate increased gait distance this session however continued to require assist for walker management, balance and safety 2 R inattention and frequently running into obstacles. Continue to recommend CIR level therapies to maximize functional independence and safety prior to return home.     Follow Up Recommendations  CIR;Supervision/Assistance - 24 hour     Equipment Recommendations  None recommended by PT    Recommendations for Other Services Rehab consult     Precautions / Restrictions Precautions Precautions: Fall Precaution Comments: R side inattention Restrictions Weight Bearing Restrictions: No    Mobility  Bed Mobility Overal bed mobility: Needs Assistance Bed Mobility: Supine to Sit     Supine to sit: Min guard;Min assist     General bed mobility comments: Grossly min guard with 1 instance of min assist for anterior lean of trunk to initiate scooting out fully to EOB.    Transfers Overall transfer level: Needs assistance Equipment used: Rolling walker (2 wheeled) Transfers: Sit to/from Stand Sit to Stand: Min assist         General transfer comment: Steadying assist required during power up to full standing. Increased time to gain/maintain standing balance and decreased awareness of safety noted as pt attempting to initiate gait  training before he had gained balance.  Ambulation/Gait Ambulation/Gait assistance: Min assist Gait Distance (Feet): 200 Feet Assistive device: Rolling walker (2 wheeled) Gait Pattern/deviations: Step-through pattern;Decreased step length - right;Decreased stride length;Decreased dorsiflexion - right;Trunk flexed Gait velocity: Decreased Gait velocity interpretation: <1.8 ft/sec, indicate of risk for recurrent falls General Gait Details: Several instances of running into objects on the R. Pt especially catching the walker on door frame and obstacles in the hallway. VC's throughout to attend to the R side and avoid obstacles, however pt unable to make corrective changes even with cues to avoid objects with the walker.   Stairs             Wheelchair Mobility    Modified Rankin (Stroke Patients Only) Modified Rankin (Stroke Patients Only) Pre-Morbid Rankin Score: Moderate disability Modified Rankin: Moderately severe disability     Balance Overall balance assessment: Needs assistance Sitting-balance support: No upper extremity supported;Feet supported Sitting balance-Leahy Scale: Good     Standing balance support: Bilateral upper extremity supported Standing balance-Leahy Scale: Poor Standing balance comment: UE support on RW.                            Cognition Arousal/Alertness: Awake/alert Behavior During Therapy: Flat affect;Impulsive Overall Cognitive Status: Impaired/Different from baseline Area of Impairment: Orientation;Attention;Memory;Following commands;Safety/judgement;Awareness;Problem solving                 Orientation Level: Disoriented to;Place;Time (aware he had a stroke) Current Attention Level: Selective Memory: Decreased recall of precautions;Decreased short-term memory Following Commands: Follows one step commands with increased time Safety/Judgement: Decreased awareness  of safety;Decreased awareness of deficits Awareness:  Intellectual Problem Solving: Slow processing;Difficulty sequencing;Requires verbal cues General Comments: Pt with poor awareness into his deficits and thus safety, needing cues to attend to R side at times. Pt impulsive and forgetful of task at hand.      Exercises      General Comments        Pertinent Vitals/Pain Pain Assessment: No/denies pain    Home Living       Type of Home: Apartment Home Access: Stairs to enter Entrance Stairs-Rails: Right Home Layout: One level        Prior Function            PT Goals (current goals can now be found in the care plan section) Acute Rehab PT Goals Patient Stated Goal: to get better PT Goal Formulation: With patient Time For Goal Achievement: 01/01/21 Potential to Achieve Goals: Good Progress towards PT goals: Progressing toward goals    Frequency    Min 4X/week      PT Plan Current plan remains appropriate    Co-evaluation              AM-PAC PT "6 Clicks" Mobility   Outcome Measure  Help needed turning from your back to your side while in a flat bed without using bedrails?: A Little Help needed moving from lying on your back to sitting on the side of a flat bed without using bedrails?: A Little Help needed moving to and from a bed to a chair (including a wheelchair)?: A Little Help needed standing up from a chair using your arms (e.g., wheelchair or bedside chair)?: A Little Help needed to walk in hospital room?: A Little Help needed climbing 3-5 steps with a railing? : A Lot 6 Click Score: 17    End of Session Equipment Utilized During Treatment: Gait belt Activity Tolerance: Patient tolerated treatment well Patient left: in chair;with call bell/phone within reach;with chair alarm set Nurse Communication: Mobility status PT Visit Diagnosis: Unsteadiness on feet (R26.81);Other abnormalities of gait and mobility (R26.89);Muscle weakness (generalized) (M62.81);Difficulty in walking, not elsewhere  classified (R26.2);Other symptoms and signs involving the nervous system (R29.898);Hemiplegia and hemiparesis Hemiplegia - Right/Left: Right Hemiplegia - dominant/non-dominant: Dominant Hemiplegia - caused by: Cerebral infarction     Time: 6283-1517 PT Time Calculation (min) (ACUTE ONLY): 30 min  Charges:  $Gait Training: 23-37 mins                     Rolinda Roan, PT, DPT Acute Rehabilitation Services Pager: [phone] Office: [phone]    Thelma Comp 12/21/2020, 9:35 AM

## 2020-12-21 NOTE — PMR Pre-admission (Signed)
PMR Admission Coordinator Pre-Admission Assessment  Patient: Melvin Taylor is an 55 y.o., male MRN: [id number] DOB: 02-Apr-1966 Height: 5\' 7"  (170.2 cm) Weight: 61.3 kg  Insurance Information HMO:     PPO:      PCP:      IPA:      80/20:      OTHER:   PRIMARY:  Medicaid [place] Access      Policy#      Subscriber: pt Benefits:  Phone #: passport one online 5/23 Eff. Date: active 5/12           Out of Pocket Max:       Life Max:  CIR: per [employer] guidelines       Financial Counselor:       Phone#:   SECONDARY: none  Development worker, community:       Phone#:   The Engineer, petroleum" for patients in Inpatient Rehabilitation Facilities with attached "Privacy Act [place] Records" was provided and verbally reviewed with: Family  Emergency Contact Information Contact Information    Name Relation Home Work Mobile   perrigram,lisa Sister   [phone]   gant,betty Elenor Legato   [phone]      Current Medical History  Patient Admitting Diagnosis: CVA History of Present Illness:  Melvin Taylor is a 55 y.o. right-handed male with history of hypertension, hyperlipidemia, CAD with myocardial infarction maintained on Plavix, epilepsy/seizures, schizophrenia, diabetes mellitus, tobacco as well as polysubstance abuse question medical compliance.  Per chart review patient lives alone.  1 level apartment.  Has a PCA 4 hours a day 6 days a week.  Independent with assistive device.  Presented 12/16/2020 with right side weakness and speech difficulties.  CT/MRI showed scattered acute infarcts within the left MCA and MCA/PCA watershed territories affecting the cortical/subcortical left frontal parietal and occipital lobes.  Multiple acute infarcts also present within the left insula and basal ganglia.  No significant mass-effect.  No hemorrhagic conversion.  MRA demonstrated moderate stenosis within the proximal M1 right MCA.  Severe stenosis within the P2 and right PCA.   Patient underwent mechanical thrombectomy per interventional radiology.  Echocardiogram with ejection fraction of 65 to 48% grade 1 diastolic dysfunction.  EEG negative for seizure.  Urine drug screen positive marijuana.  Neurology follow-up work-up presently ongoing.  Maintained on Lovenox for DVT prophylaxis.  Therapy evaluations completed with recommendations of physical medicine rehab consult due to right side weakness and aphasia  Complete NIHSS TOTAL: 3  Patient's medical record from [facility] has been reviewed by the rehabilitation admission coordinator and physician.  Past Medical History  Past Medical History:  Diagnosis Date  . Anxiety   . Bipolar 1 disorder ([place])   . Bone spur    left heel  . Cervical radiculopathy   . Chest pain   . Chronic back pain   . Chronic chest wall pain   . Chronic neck pain   . Coronary artery disease    a. s/p CABG in 03/2016 with LIMA-LAD, SVG-D1, SVG-RCA, and Seq SVG-mid and distal OM  . Depression   . Diabetes mellitus    Type II  . Hallucinations    "long history of them"  . Headache(784.0)   . History of gout   . HTN (hypertension)   . Insomnia   . Myocardial infarction ([place]) 2017  . Neuropathy   . Pain management   . Polysubstance abuse ([place])   . Right leg pain    chronic  .  Schizophrenia ([place])   . Seizures ([place])    last sz between July 5-9th, 2016; epilepsy  . Shortness of breath dyspnea    with exertion  . Sleep apnea   . Transfusion of blood product refused for religious reason     Family History   family history includes Arthritis in an other family member; Asthma in an other family member; Diabetes in his father and another family member; Hypertension in his father; Stroke in his sister.  Prior Rehab/Hospitalizations Has the patient had prior rehab or hospitalizations prior to admission? Yes  Has the patient had major surgery during 100 days prior to admission? Yes   Current  Medications  Current Facility-Administered Medications:  .   stroke: mapping our early stages of recovery book, , Does not apply, Once, Bhagat, Srishti L, MD .  0.9 %  sodium chloride infusion, , Intravenous, Continuous, Garvin Fila, MD, Last Rate: 60 mL/hr at 12/21/20 0400, Infusion Verify at 12/21/20 0400 .  acetaminophen (TYLENOL) tablet 650 mg, 650 mg, Oral, Q4H PRN **OR** acetaminophen (TYLENOL) 160 MG/5ML solution 650 mg, 650 mg, Per Tube, Q4H PRN **OR** acetaminophen (TYLENOL) suppository 650 mg, 650 mg, Rectal, Q4H PRN, Deveshwar, Sanjeev, MD .  albuterol (PROVENTIL) (2.5 MG/3ML) 0.083% nebulizer solution 2.5 mg, 2.5 mg, Nebulization, Q4H PRN, Bhagat, Srishti L, MD .  amLODipine (NORVASC) tablet 10 mg, 10 mg, Oral, Daily, Jennelle Human B, NP, 10 mg at 12/21/20 0846 .  atorvastatin (LIPITOR) tablet 80 mg, 80 mg, Oral, QHS, Rosalin Hawking, MD, 80 mg at 12/20/20 2144 .  divalproex (DEPAKOTE) DR tablet 1,000 mg, 1,000 mg, Oral, Q12H, Rosalin Hawking, MD, 1,000 mg at 12/21/20 0846 .  enoxaparin (LOVENOX) injection 40 mg, 40 mg, Subcutaneous, Q24H, Rosalin Hawking, MD, 40 mg at 12/20/20 1227 .  escitalopram (LEXAPRO) tablet 20 mg, 20 mg, Oral, Daily, Rosalin Hawking, MD, 20 mg at 12/21/20 0846 .  feeding supplement (ENSURE ENLIVE / ENSURE PLUS) liquid 237 mL, 237 mL, Oral, TID BM, Simpson, Paula B, NP, 237 mL at 12/21/20 0851 .  hydrALAZINE (APRESOLINE) injection 5-10 mg, 5-10 mg, Intravenous, Q2H PRN, Rosalin Hawking, MD, 10 mg at 12/21/20 0540 .  hydrochlorothiazide (HYDRODIURIL) tablet 25 mg, 25 mg, Oral, Daily, Bailey-Modzik, Delila A, NP, 25 mg at 12/21/20 0847 .  insulin aspart (novoLOG) injection 0-9 Units, 0-9 Units, Subcutaneous, TID WC & HS, Rosalin Hawking, MD, 1 Units at 12/21/20 [phone] .  isosorbide mononitrate (IMDUR) 24 hr tablet 30 mg, 30 mg, Oral, Daily, Jennelle Human B, NP, 30 mg at 12/21/20 0845 .  lamoTRIgine (LAMICTAL) tablet 50 mg, 50 mg, Oral, BID, Rosalin Hawking, MD, 50 mg at 12/21/20 0845 .   melatonin tablet 10 mg, 10 mg, Oral, QHS PRN, Bhagat, Srishti L, MD .  multivitamin with minerals tablet 1 tablet, 1 tablet, Oral, Daily, Jennelle Human B, NP, 1 tablet at 12/21/20 0848 .  pantoprazole (PROTONIX) EC tablet 40 mg, 40 mg, Oral, Daily, Rosalin Hawking, MD, 40 mg at 12/21/20 0847 .  senna-docusate (Senokot-S) tablet 1 tablet, 1 tablet, Oral, QHS PRN, Bhagat, Srishti L, MD .  ticagrelor (BRILINTA) tablet 90 mg, 90 mg, Oral, BID, 90 mg at 12/21/20 0847 **OR** ticagrelor (BRILINTA) tablet 90 mg, 90 mg, Per Tube, BID, Jennelle Human B, NP, 90 mg at 12/17/20 [id number]  Patients Current Diet:  Diet Order            Diet heart healthy/carb modified Room service appropriate? Yes with Assist; Fluid consistency: Thin  Diet effective now  Precautions / Restrictions Precautions Precautions: Fall Precaution Comments: R side inattention Restrictions Weight Bearing Restrictions: No   Has the patient had 2 or more falls or a fall with injury in the past year? No  Prior Activity Level Limited Community (1-2x/wk): Pt. went out mostly for appointments  Prior Functional Level Self Care: Did the patient need help bathing, dressing, using the toilet or eating? Independent  Indoor Mobility: Did the patient need assistance with walking from room to room (with or without device)? Independent  Stairs: Did the patient need assistance with internal or external stairs (with or without device)? Independent  Functional Cognition: Did the patient need help planning regular tasks such as shopping or remembering to take medications? Needed some help  Home Assistive Devices / [place] Devices/Equipment: Blood pressure cuff,Eyeglasses,CBG Meter,Shower chair with back,Cane (specify quad or straight) Home Equipment: Walker - 2 wheels  Prior Device Use: Indicate devices/aids used by the patient prior to current illness, exacerbation or injury? None of the above  Current  Functional Level Cognition  Arousal/Alertness: Awake/alert Overall Cognitive Status: Impaired/Different from baseline Current Attention Level: Selective Orientation Level: Oriented to person,Oriented to place,Oriented to situation Following Commands: Follows one step commands with increased time Safety/Judgement: Decreased awareness of safety,Decreased awareness of deficits General Comments: Pt with poor awareness into his deficits and thus safety, needing cues to attend to R side at times. Pt impulsive and forgetful of task at hand. Attention: Sustained Sustained Attention: Impaired Sustained Attention Impairment: Verbal basic Memory: Impaired Memory Impairment: Decreased recall of new information Awareness: Impaired Awareness Impairment: Intellectual impairment,Emergent impairment,Anticipatory impairment Problem Solving: Impaired Problem Solving Impairment: Verbal basic,Functional basic Safety/Judgment: Impaired    Extremity Assessment (includes Sensation/Coordination)  Upper Extremity Assessment: Defer to OT evaluation RUE Deficits / Details: appears to demonstrate apparent sensory motor deficits; using funcitonally but decreased fine-motor and in-hand manipulation skills RUE Sensation: decreased light touch (unablet o distinguish correct location when touched) RUE Coordination: decreased fine motor,decreased gross motor  Lower Extremity Assessment: RLE deficits/detail RLE Deficits / Details: Generalized weakness of grossly 4 to 4+ with MMT compared to 4+ to 5 on L; Decreased proprioception and sensation noted with mobility; decreased coordination with dysdiadochokinesia and possible dysmetria noted RLE Sensation: decreased proprioception (noted with mobility; was trying to rub shin without heel touching and did not notice, demonstrating impaired sensation) RLE Coordination: decreased gross motor,decreased fine motor (dysdiadochokinesia and possible dysmetria noted)    ADLs   Overall ADL's : Needs assistance/impaired Eating/Feeding: Set up,Sitting Grooming: Set up,Sitting Upper Body Bathing: Set up,Sitting Lower Body Bathing: Minimal assistance,Sit to/from stand Upper Body Dressing : Minimal assistance,Sitting Lower Body Dressing: Minimal assistance,Sit to/from stand Toilet Transfer: Minimal assistance,+2 for safety/equipment,RW Toileting- Clothing Manipulation and Hygiene: Total assistance Toileting - Clothing Manipulation Details (indicate cue type and reason): condom cath Functional mobility during ADLs: Minimal assistance,+2 for safety/equipment,Rolling walker,Cueing for safety    Mobility  Overal bed mobility: Needs Assistance Bed Mobility: Supine to Sit Supine to sit: Min guard,Min assist General bed mobility comments: Grossly min guard with 1 instance of min assist for anterior lean of trunk to initiate scooting out fully to EOB.    Transfers  Overall transfer level: Needs assistance Equipment used: Rolling walker (2 wheeled) Transfers: Sit to/from Stand Sit to Stand: Min assist Stand pivot transfers: Min assist,+2 safety/equipment General transfer comment: Steadying assist required during power up to full standing. Increased time to gain/maintain standing balance and decreased awareness of safety noted as pt attempting to initiate gait training  before he had gained balance.    Ambulation / Gait / Stairs / Wheelchair Mobility  Ambulation/Gait Ambulation/Gait assistance: Herbalist (Feet): 200 Feet Assistive device: Rolling walker (2 wheeled) Gait Pattern/deviations: Step-through pattern,Decreased step length - right,Decreased stride length,Decreased dorsiflexion - right,Trunk flexed General Gait Details: Several instances of running into objects on the R. Pt especially catching the walker on door frame and obstacles in the hallway. VC's throughout to attend to the R side and avoid obstacles, however pt unable to make corrective changes  even with cues to avoid objects with the walker. Gait velocity: Decreased Gait velocity interpretation: <1.8 ft/sec, indicate of risk for recurrent falls    Posture / Balance Balance Overall balance assessment: Needs assistance Sitting-balance support: No upper extremity supported,Feet supported Sitting balance-Leahy Scale: Good Standing balance support: Bilateral upper extremity supported Standing balance-Leahy Scale: Poor Standing balance comment: UE support on RW.    Special needs/care consideration Skin: Abrasion to the right hand and knee and Diabetic management  sQ Novolog 3x daily with meals   Previous Home Environment (from acute therapy documentation) Living Arrangements: Alone  Lives With: Alone Available Help at Discharge: Family,Personal care attendant (PCA 4 hrs/day 6 days/wk) Type of Home: Apartment Home Layout: One level Home Access: Stairs to enter Entrance Stairs-Rails: Right Entrance Stairs-Number of Steps: 3 Bathroom Shower/Tub: Optometrist: Yes How Accessible: Accessible via walker [place]: Yes Type of Home Care Services: Homehealth aide (personal care assistance that comes 6 days/week, 4 hours/day)  Discharge Living Setting Plans for Discharge Living Setting: Patient's home Type of Home at Discharge: Apartment Discharge Home Layout: One level Discharge Home Access: Stairs to enter Entrance Stairs-Rails: Right Entrance Stairs-Number of Steps: 3 Discharge Bathroom Shower/Tub: Tub/shower unit Discharge Bathroom Toilet: Standard Discharge Bathroom Accessibility: Yes How Accessible: Accessible via walker Does the patient have any problems obtaining your medications?: No  Social/Family/Support Systems Patient Roles: Other (Comment) Contact Information: [phone] Anticipated Caregiver: Charolette Child (aunt) confirmed that Pt. Has a aide in the mornings 6 days a week and states that she and pt.'s  sister, [name], can provide assistance in the afternoons and weekends when an aide is not available. I was not able to reach sister, [name], but she confirmed with TOC that she wants Pt. To come [facility] Anticipated Caregiver's Contact Information: [phone] Caregiver Availability: 24/7 Discharge Plan Discussed with Primary Caregiver: Yes Is Caregiver In Agreement with Plan?: Yes  Goals Patient/Family Goal for Rehab: PT/OT Mod I; SLP Supervision Expected length of stay: 5-7 days Pt/Family Agrees to Admission and willing to participate: Yes Program Orientation Provided & Reviewed with Pt/Caregiver Including Roles  & Responsibilities: Yes  Decrease burden of Care through IP rehab admission: Specialzed equipment needs, Decrease number of caregivers, Bowel and bladder program and Patient/family education  Possible need for SNF placement upon discharge: not anticipated  Patient Condition: I have reviewed medical records from [facility], spoken with CM, and patient and family member. I discussed via phone for inpatient rehabilitation assessment.  Patient will benefit from ongoing PT, OT and SLP, can actively participate in 3 hours of therapy a day 5 days of the week, and can make measurable gains during the admission.  Patient will also benefit from the coordinated team approach during an Inpatient Acute Rehabilitation admission.  The patient will receive intensive therapy as well as Rehabilitation physician, nursing, social worker, and care management interventions.  Due to safety, skin/wound care, disease management, medication  administration, pain management and patient education the patient requires 24 hour a day rehabilitation nursing.  The patient is currently min A- Supervision with mobility and basic ADLs.  Discharge setting and therapy post discharge at home with home health is anticipated.  Patient has agreed to participate in the Acute Inpatient Rehabilitation Program and will  admit today.  Preadmission Screen Completed By:  Genella Mech, 12/21/2020 10:54 AM ______________________________________________________________________   Discussed status with Dr. Naaman Plummer  on 12/21/2020 at 945 and received approval for admission today.  Admission Coordinator:  Genella Mech, CCC-SLP, time 12/21/20*/Date 1100   Assessment/Plan: Diagnosis: left CVA 1. Does the need for close, 24 hr/day Medical supervision in concert with the patient's rehab needs make it unreasonable for this patient to be served in a less intensive setting? Yes 2. Co-Morbidities requiring supervision/potential complications: HTN, CAD, sz, schizophrenia 3. Due to bladder management, bowel management, safety, skin/wound care, disease management, medication administration, pain management and patient education, does the patient require 24 hr/day rehab nursing? Yes 4. Does the patient require coordinated care of a physician, rehab nurse, PT, OT, and SLP to address physical and functional deficits in the context of the above medical diagnosis(es)? Yes Addressing deficits in the following areas: balance, endurance, locomotion, strength, transferring, bowel/bladder control, bathing, dressing, feeding, grooming, toileting, cognition and psychosocial support 5. Can the patient actively participate in an intensive therapy program of at least 3 hrs of therapy 5 days a week? Yes 6. The potential for patient to make measurable gains while on inpatient rehab is excellent 7. Anticipated functional outcomes upon discharge from inpatient rehab: modified independent PT, modified independent OT, supervision SLP 8. Estimated rehab length of stay to reach the above functional goals is: 5-7 days 9. Anticipated discharge destination: Home 10. Overall Rehab/Functional Prognosis: excellent   MD Signature: Meredith Staggers, MD, [facility] 12/21/2020

## 2020-12-21 NOTE — TOC Transition Note (Signed)
Transition of Care [facility]) - CM/SW Discharge Note   Patient Details  Name: VAISHNAV DEMARTIN MRN: [id number] Date of Birth: Mar 10, 1966  Transition of Care [facility]) CM/SW Contact:  Pollie Friar, RN Phone Number: 12/21/2020, 10:47 AM   Clinical Narrative:    Patient is discharging to CIR today. CM signing off.   Final next level of care: IP Rehab Facility Barriers to Discharge: No Barriers Identified   Patient Goals and CMS Choice Patient states their goals for this hospitalization and ongoing recovery are:: to go home      Discharge Placement                       Discharge Plan and Services                                     Social Determinants of Health (SDOH) Interventions     Readmission Risk Interventions No flowsheet data found.

## 2020-12-21 NOTE — H&P (Addendum)
Physical Medicine and Rehabilitation Admission H&P        Chief Complaint  Patient presents with  . Code Stroke  : HPI: Melvin Taylor is a 55 year old right-handed male with history of hypertension hyperlipidemia, CAD with myocardial infarction maintained on Plavix, epilepsy/seizures maintained on Depakote as well as Lamictal, schizophrenia, diabetes mellitus, tobacco abuse as well as polysubstance abuse questionable medical compliance.  Per chart review patient lives alone.  1 level home.  Has a PCA 4 hours a day 6 days a week.  Independent with assistive device.  He has a sister in the area.  Presented 12/16/2020 with acute onset of right-sided weakness and speech difficulties.  CT/MRI showed scattered acute infarcts within the left MCA and MCA/PCA watershed territories affecting the cortical/subcortical left frontal parietal and occipital lobe.  Multiple acute infarcts also present within the left insula and basal ganglia.  No significant mass-effect.  No hemorrhagic conversion.  MRA demonstrated moderate stenosis within the proximal M1 and right MCA.  Severe stenosis within the P2 and right PCA.  Patient underwent mechanical thrombectomy per interventional radiology.  Echocardiogram with ejection fraction of 65 to 27% grade 1 diastolic dysfunction.  EEG negative for seizure.  Admission chemistries glucose 175, creatinine 2.15, hemoglobin 12, hemoglobin A1c 9.7, urine drug screen was positive for marijuana.  Currently maintained on Brilinta for CVA prophylaxis, no aspirin secondary to questionable allergy.  Maintained on Lovenox for DVT prophylaxis.  Therapy evaluations completed due to patient's right side weakness and speech difficulties was admitted for a comprehensive rehab program.   Review of Systems  Constitutional: Negative for chills and fever.  HENT: Negative for hearing loss.   Eyes: Negative for blurred vision and double vision.  Respiratory: Negative for cough.         Shortness of breath with exertion  Cardiovascular: Negative for chest pain, palpitations and leg swelling.  Gastrointestinal: Positive for constipation. Negative for heartburn, nausea and vomiting.  Genitourinary: Negative for dysuria, flank pain and hematuria.  Musculoskeletal: Positive for back pain, joint pain and myalgias.  Skin: Negative for rash.  Neurological: Positive for speech change, seizures, weakness and headaches.  Psychiatric/Behavioral: Positive for depression. The patient has insomnia.        Bipolar disorder/schizophrenia  All other systems reviewed and are negative.       Past Medical History:  Diagnosis Date  . Anxiety    . Bipolar 1 disorder ([place])    . Bone spur      left heel  . Cervical radiculopathy    . Chest pain    . Chronic back pain    . Chronic chest wall pain    . Chronic neck pain    . Coronary artery disease      a. s/p CABG in 03/2016 with LIMA-LAD, SVG-D1, SVG-RCA, and Seq SVG-mid and distal OM  . Depression    . Diabetes mellitus      Type II  . Hallucinations      "long history of them"  . Headache(784.0)    . History of gout    . HTN (hypertension)    . Insomnia    . Myocardial infarction ([place]) 2017  . Neuropathy    . Pain management    . Polysubstance abuse ([place])    . Right leg pain      chronic  . Schizophrenia ([place])    . Seizures ([place])      last sz between July 5-9th, 2016; epilepsy  .  Shortness of breath dyspnea      with exertion  . Sleep apnea    . Transfusion of blood product refused for religious reason           Past Surgical History:  Procedure Laterality Date  . ANTERIOR CERVICAL DECOMP/DISCECTOMY FUSION N/A 12/14/2015    Procedure: ANTERIOR CERVICAL DECOMPRESSION/DISCECTOMY FUSION CERVICAL FIVE -SIX;  Surgeon: Earnie Larsson, MD;  Location: [facility];  Service: Neurosurgery;  Laterality: N/A;  . BIOPSY   05/07/2015    Procedure: BIOPSY (Gastric);  Surgeon: [name], MD;  Location: [facility];  Service: Endoscopy;;   . CARDIAC CATHETERIZATION N/A 03/07/2016    Procedure: Left Heart Cath and Coronary Angiography;  Surgeon: Belva Crome, MD;  Location: [facility];  Service: Cardiovascular;  Laterality: N/A;  . COLONOSCOPY WITH PROPOFOL N/A 05/07/2015    [id number] diverticulosis, multiple colon polyps removed, tubular adenoma, serrated colon polyp. Next colonoscopy October 2019  . COLONOSCOPY WITH PROPOFOL N/A 08/02/2018    Procedure: COLONOSCOPY WITH PROPOFOL;  Surgeon: [name], MD;  [facility];  Service: Endoscopy;  Laterality: N/A;  12:00pm  . CORONARY ARTERY BYPASS GRAFT N/A 03/28/2016    Procedure: CORONARY ARTERY BYPASS GRAFTING (CABG) x 5 USING GREATER SAPHENOUS VEIN;  Surgeon: [name], MD;  Location: [facility];  Service: Open Heart Surgery;  Laterality: N/A;  . ENDOVEIN HARVEST OF GREATER SAPHENOUS VEIN Right 03/28/2016    Procedure: ENDOVEIN HARVEST OF GREATER SAPHENOUS VEIN;  Surgeon: [name], MD;  Location: [facility];  Service: Open Heart Surgery;  Laterality: Right;  . ESOPHAGEAL DILATION N/A 05/07/2015    Procedure: ESOPHAGEAL DILATION WITH 56FR MALONEY DILATOR;  Surgeon: [name], MD;  Location: [facility];  Service: Endoscopy;  Laterality: N/A;  . ESOPHAGOGASTRODUODENOSCOPY (EGD) WITH PROPOFOL N/A 05/07/2015    RMR: Status post dilation of normal esophagus. Gastritis.  Marland Kitchen KNEE SURGERY Left      arthroscopy  . MANDIBLE FRACTURE SURGERY      . POLYPECTOMY   05/07/2015    Procedure: POLYPECTOMY (Hepatic Flexure, Distal Transverse Colon, Rectal);  Surgeon: [name], MD;  Location: [facility];  Service: Endoscopy;;  . RADIOLOGY WITH ANESTHESIA N/A 12/16/2020    Procedure: IR WITH ANESTHESIA;  Surgeon: Luanne Bras, MD;  Location: [facility];  Service: Radiology;  Laterality: N/A;  . TEE WITHOUT CARDIOVERSION N/A 03/28/2016    Procedure: TRANSESOPHAGEAL ECHOCARDIOGRAM (TEE);  Surgeon: [name], MD;  Location: [facility];  Service: Open Heart Surgery;  Laterality:  N/A;  . TONSILLECTOMY             Family History  Problem Relation Age of Onset  . Diabetes Father    . Hypertension Father    . Arthritis Other    . Diabetes Other    . Asthma Other    . Stroke Sister      Social History:  reports that he has been smoking cigarettes. He has a 15.00 pack-year smoking history. He has never used smokeless tobacco. He reports current drug use. Drug: Marijuana. He reports that he does not drink alcohol. Allergies:       Allergies  Allergen Reactions  . Aspirin Swelling and Other (See Comments)      Facial swelling           Medications Prior to Admission  Medication Sig Dispense Refill  . albuterol (PROVENTIL HFA;VENTOLIN HFA) 108 (90 Base) MCG/ACT inhaler Inhale 2 puffs into the  lungs every 4 (four) hours as needed for wheezing or shortness of breath.       Marland Kitchen amLODipine (NORVASC) 10 MG tablet Take 1 tablet (10 mg total) by mouth daily. 30 tablet 3  . atorvastatin (LIPITOR) 40 MG tablet Take 40 mg by mouth daily.      . baclofen (LIORESAL) 10 MG tablet Take 10 mg by mouth 2 (two) times daily.      . clopidogrel (PLAVIX) 75 MG tablet Take 75 mg by mouth daily.      . cycloSPORINE (RESTASIS) 0.05 % ophthalmic emulsion Place 1 drop into both eyes 2 (two) times daily.      . diazepam (VALIUM) 5 MG tablet Take 5 mg by mouth 2 (two) times daily as needed for anxiety.       . divalproex (DEPAKOTE) 500 MG DR tablet Take 3 tablets (1,500 mg total) by mouth 2 (two) times daily. 540 tablet 4  . escitalopram (LEXAPRO) 20 MG tablet Take 20 mg by mouth daily.       Marland Kitchen ibuprofen (ADVIL) 800 MG tablet Take 800 mg by mouth every 6 (six) hours as needed for moderate pain.      Marland Kitchen insulin aspart (NOVOLOG) 100 UNIT/ML FlexPen Inject 4-10 Units into the skin 3 (three) times daily with meals. (Patient taking differently: Inject 4 Units into the skin in the morning, at noon, in the evening, and at bedtime.) 15 mL 1  . insulin glargine (LANTUS SOLOSTAR) 100 UNIT/ML Solostar Pen  Inject 40 Units into the skin at bedtime. 30 mL 0  . lamoTRIgine (LAMICTAL) 25 MG tablet Take 50 mg by mouth 2 (two) times daily.      Marland Kitchen lisinopril (ZESTRIL) 20 MG tablet TAKE ONE TABLET BY MOUTH ONCE DAILY. (Patient taking differently: Take 20 mg by mouth daily.) 90 tablet 3  . Melatonin 10 MG CAPS Take 10 capsules by mouth at bedtime as needed (for sleep).      . nitroGLYCERIN (NITROSTAT) 0.4 MG SL tablet Place 1 tablet (0.4 mg total) under the tongue every 5 (five) minutes as needed for chest pain. 25 tablet 3  . OLANZapine (ZYPREXA) 5 MG tablet Take 5 mg by mouth in the morning and at bedtime.      Marland Kitchen oxyCODONE-acetaminophen (PERCOCET) 5-325 MG tablet Take 1-2 tablets by mouth every 4 (four) hours as needed for severe pain. 30 tablet 0  . ranolazine (RANEXA) 500 MG 12 hr tablet Take 1 tablet (500 mg total) by mouth 2 (two) times daily. 180 tablet 3  . risperiDONE (RISPERDAL) 3 MG tablet Take 3 mg by mouth at bedtime.      . isosorbide mononitrate (IMDUR) 30 MG 24 hr tablet Take 1 tablet (30 mg total) by mouth daily. (Patient not taking: Reported on 12/17/2020) 30 tablet 1  . paliperidone (INVEGA) 6 MG 24 hr tablet Take 12 mg by mouth daily. (Patient not taking: Reported on 12/17/2020)          Drug Regimen Review Drug regimen was reviewed and remains appropriate with no significant issues identified   Home: Home Living Family/patient expects to be discharged to:: Private residence Living Arrangements: Alone Available Help at Discharge: Family,Personal care attendant (PCA 4 hrs/day 6 days/wk) Type of Home: Apartment Home Access: Level entry Home Layout: One level Bathroom Shower/Tub: Chiropodist: Standard Bathroom Accessibility: Yes Home Equipment: Environmental consultant - 2 wheels  Lives With: Alone   Functional History: Prior Function Level of Independence: Needs assistance,Independent,Independent with assistive device(s)  Gait / Transfers Assistance Needed: ambulated  independently ADL's / Homemaking Assistance Needed: I with ADL, assist with IADL tasks form PCA for medicaiton managment, cooking, some shopping; family also assisted. Pt does some cooking himself and occasionally does his own laundry. L:ikes to walk Communication / Swallowing Assistance Needed: [facility]   Functional Status:  Mobility: Bed Mobility Overal bed mobility: Needs Assistance Bed Mobility: Supine to Sit Supine to sit: Min guard General bed mobility comments: Pt able to come to sit with min guard for safety and extra time. Transfers Overall transfer level: Needs assistance Equipment used: Rolling walker (2 wheeled) Transfers: Sit to/from [employer] Sit to Stand: [employer] safety/equipment Stand pivot transfers: Min assist,+2 safety/equipment General transfer comment: MinA for steadying and to direct pt to step to L to transfer to recliner with RW. cues to reach back for target surface. Sit > stand 1x from bed, 2x from recliner, and 1x from toilet. Ambulation/Gait Ambulation/Gait assistance: Min assist Gait Distance (Feet):  (x4 bouts of ~3 ft > ~10 ft > ~15 ft > ~15 ft) Assistive device: Rolling walker (2 wheeled) Gait Pattern/deviations: Step-through pattern,Decreased step length - right,Decreased stride length,Decreased dorsiflexion - right,Trunk flexed General Gait Details: Pt with short, unstable gait, impulsively reaching off BOS on occasion. Pt neglectful of R side, bumping into objects and stepping R foot lateral to RW, needing cues to attend to and correct. MinA for steadying and directing pt. Gait velocity: reduced Gait velocity interpretation: <1.31 ft/sec, indicative of household ambulator   ADL: ADL Overall ADL's : Needs assistance/impaired Eating/Feeding: Set up,Sitting Grooming: Set up,Sitting Upper Body Bathing: Set up,Sitting Lower Body Bathing: Minimal assistance,Sit to/from stand Upper Body Dressing : Minimal assistance,Sitting Lower Body  Dressing: Minimal assistance,Sit to/from stand Toilet Transfer: Minimal assistance,+2 for safety/equipment,RW Toileting- Clothing Manipulation and Hygiene: Total assistance Toileting - Clothing Manipulation Details (indicate cue type and reason): condom cath Functional mobility during ADLs: Minimal assistance,+2 for safety/equipment,Rolling walker,Cueing for safety   Cognition: Cognition Overall Cognitive Status: Impaired/Different from baseline Arousal/Alertness: Awake/alert Orientation Level: Oriented to person,Oriented to place,Oriented to situation Attention: Sustained Sustained Attention: Impaired Sustained Attention Impairment: Verbal basic Memory: Impaired Memory Impairment: Decreased recall of new information Awareness: Impaired Awareness Impairment: Intellectual impairment,Emergent impairment,Anticipatory impairment Problem Solving: Impaired Problem Solving Impairment: Verbal basic,Functional basic Safety/Judgment: Impaired Cognition Arousal/Alertness: Awake/alert Behavior During Therapy: Flat affect,Impulsive Overall Cognitive Status: Impaired/Different from baseline Area of Impairment: Orientation,Attention,Memory,Following commands,Safety/judgement,Awareness,Problem solving Orientation Level: Disoriented to,Place,Time (aware he had a stroke) Current Attention Level: Selective Memory: Decreased recall of precautions,Decreased short-term memory Following Commands: Follows one step commands with increased time Safety/Judgement: Decreased awareness of safety,Decreased awareness of deficits Awareness: Intellectual Problem Solving: Slow processing,Difficulty sequencing,Requires verbal cues General Comments: Pt with poor awareness into his deficits and thus safety, needing cues to attend to R side at times. Pt impulsive and forgetful of task at hand.   Physical Exam: Blood pressure (!) 153/72, pulse 69, temperature 98.1 F (36.7 C), temperature source Oral, resp. rate 18,  height 5\' 7"  (1.702 m), weight 61.3 kg, SpO2 100 %. Physical Exam Constitutional:      Appearance: He is ill-appearing.     Comments: lethargic  HENT:     Head: Normocephalic and atraumatic.     Right Ear: External ear normal.     Left Ear: External ear normal.     Nose: Nose normal.     Mouth/Throat:     Mouth: Mucous membranes are moist.  Eyes:     Extraocular Movements: Extraocular movements  intact.     Conjunctiva/sclera: Conjunctivae normal.     Pupils: Pupils are equal, round, and reactive to light.  Cardiovascular:     Rate and Rhythm: Normal rate and regular rhythm.     Heart sounds: No murmur heard.    Pulmonary:     Effort: Pulmonary effort is normal. No respiratory distress.     Breath sounds: No wheezing.  Abdominal:     General: Bowel sounds are normal. There is no distension.     Tenderness: There is no abdominal tenderness.  Musculoskeletal:        General: No swelling or tenderness.     Cervical back: Normal range of motion.     Right lower leg: No edema.     Left lower leg: No edema.  Skin:    General: Skin is warm.  Neurological:     Comments: Pt is lethargic. Does open eyes and follow commands with verbal cueing. Told me he was from [place], didn't know where he was. Oriented to name. RUE grossly 3/5, RLE 2+ to 3-/5 with inconsistent effort. LUE and LLE 3-4/5 with inconsistent effort. Withdrew to pain on all 4. No abnl tone.   Psychiatric:     Comments: Flat and disengaged        Lab Results Last 48 Hours        Results for orders placed or performed during the hospital encounter of 12/16/20 (from the past 48 hour(s))  Glucose, capillary     Status: Abnormal    Collection Time: 12/19/20  7:56 AM  Result Value Ref Range    Glucose-Capillary 150 (H) 70 - 99 mg/dL      Comment: Glucose reference range applies only to samples taken after fasting for at least 8 hours.  Glucose, capillary     Status: Abnormal    Collection Time: 12/19/20 11:36 AM   Result Value Ref Range    Glucose-Capillary 216 (H) 70 - 99 mg/dL      Comment: Glucose reference range applies only to samples taken after fasting for at least 8 hours.  CBC     Status: Abnormal    Collection Time: 12/19/20 11:57 AM  Result Value Ref Range    WBC 4.5 4.0 - 10.5 K/uL    RBC 3.36 (L) 4.22 - 5.81 MIL/uL    Hemoglobin 10.5 (L) 13.0 - 17.0 g/dL    HCT 30.3 (L) 39.0 - 52.0 %    MCV 90.2 80.0 - 100.0 fL    MCH 31.3 26.0 - 34.0 pg    MCHC 34.7 30.0 - 36.0 g/dL    RDW 12.3 11.5 - 15.5 %    Platelets 129 (L) 150 - 400 K/uL      Comment: REPEATED TO VERIFY    nRBC 0.0 0.0 - 0.2 %      Comment: Performed at [facility], [place] [address], [place], [place] [address]  Glucose, capillary     Status: Abnormal    Collection Time: 12/19/20  5:04 PM  Result Value Ref Range    Glucose-Capillary 231 (H) 70 - 99 mg/dL      Comment: Glucose reference range applies only to samples taken after fasting for at least 8 hours.  Glucose, capillary     Status: Abnormal    Collection Time: 12/19/20  8:00 PM  Result Value Ref Range    Glucose-Capillary 227 (H) 70 - 99 mg/dL      Comment: Glucose reference range applies only to  samples taken after fasting for at least 8 hours.    Comment 1 Notify RN      Comment 2 Document in Chart    Glucose, capillary     Status: Abnormal    Collection Time: 12/19/20 10:41 PM  Result Value Ref Range    Glucose-Capillary 202 (H) 70 - 99 mg/dL      Comment: Glucose reference range applies only to samples taken after fasting for at least 8 hours.  Basic metabolic panel     Status: Abnormal    Collection Time: 12/20/20  1:24 AM  Result Value Ref Range    Sodium 137 135 - 145 mmol/L    Potassium 3.7 3.5 - 5.1 mmol/L    Chloride 108 98 - 111 mmol/L    CO2 22 22 - 32 mmol/L    Glucose, Bld 96 70 - 99 mg/dL      Comment: Glucose reference range applies only to samples taken after fasting for at least 8 hours.    BUN 20 6 - 20 mg/dL    Creatinine, Ser  1.61 (H) 0.61 - 1.24 mg/dL    Calcium 8.4 (L) 8.9 - 10.3 mg/dL    GFR, Estimated 50 (L) >60 mL/min      Comment: (NOTE) Calculated using the CKD-EPI Creatinine Equation (2021)      Anion gap 7 5 - 15      Comment: Performed at [facility] [address] [place], [place] [address]  CBC with Differential/Platelet     Status: Abnormal    Collection Time: 12/20/20  1:24 AM  Result Value Ref Range    WBC 5.9 4.0 - 10.5 K/uL    RBC 3.19 (L) 4.22 - 5.81 MIL/uL    Hemoglobin 10.0 (L) 13.0 - 17.0 g/dL    HCT 28.1 (L) 39.0 - 52.0 %    MCV 88.1 80.0 - 100.0 fL    MCH 31.3 26.0 - 34.0 pg    MCHC 35.6 30.0 - 36.0 g/dL    RDW 12.1 11.5 - 15.5 %    Platelets   150 - 400 K/uL      PLATELET CLUMPS NOTED ON SMEAR, COUNT APPEARS DECREASED      Comment: Immature Platelet Fraction may be clinically indicated, consider ordering this additional test [id number]      nRBC 0.0 0.0 - 0.2 %    Neutrophils Relative % 82 %    Neutro Abs 4.8 1.7 - 7.7 K/uL    Lymphocytes Relative 10 %    Lymphs Abs 0.6 (L) 0.7 - 4.0 K/uL    Monocytes Relative 8 %    Monocytes Absolute 0.5 0.1 - 1.0 K/uL    Eosinophils Relative 0 %    Eosinophils Absolute 0.0 0.0 - 0.5 K/uL    Basophils Relative 0 %    Basophils Absolute 0.0 0.0 - 0.1 K/uL    Immature Granulocytes 0 %    Abs Immature Granulocytes 0.02 0.00 - 0.07 K/uL      Comment: Performed at [facility], [place] [address], [place], [place] [address]  Valproic acid level     Status: None    Collection Time: 12/20/20  1:24 AM  Result Value Ref Range    Valproic Acid Lvl 88 50.0 - 100.0 ug/mL      Comment: Performed at [facility] [address], [place], [place] [address]  Glucose, capillary     Status: Abnormal    Collection Time:  12/20/20  6:47 AM  Result Value Ref Range    Glucose-Capillary 115 (H) 70 - 99 mg/dL      Comment: Glucose reference range applies only to samples taken after fasting for at least 8 hours.    Comment 1 Notify RN       Comment 2 Document in Chart    Glucose, capillary     Status: Abnormal    Collection Time: 12/20/20 11:45 AM  Result Value Ref Range    Glucose-Capillary 147 (H) 70 - 99 mg/dL      Comment: Glucose reference range applies only to samples taken after fasting for at least 8 hours.  Glucose, capillary     Status: Abnormal    Collection Time: 12/20/20 12:06 PM  Result Value Ref Range    Glucose-Capillary 198 (H) 70 - 99 mg/dL      Comment: Glucose reference range applies only to samples taken after fasting for at least 8 hours.    Comment 1 Notify RN      Comment 2 Document in Chart    Glucose, capillary     Status: Abnormal    Collection Time: 12/20/20  4:09 PM  Result Value Ref Range    Glucose-Capillary 182 (H) 70 - 99 mg/dL      Comment: Glucose reference range applies only to samples taken after fasting for at least 8 hours.  Glucose, capillary     Status: Abnormal    Collection Time: 12/20/20  9:23 PM  Result Value Ref Range    Glucose-Capillary 123 (H) 70 - 99 mg/dL      Comment: Glucose reference range applies only to samples taken after fasting for at least 8 hours.  Basic metabolic panel     Status: Abnormal    Collection Time: 12/21/20  5:33 AM  Result Value Ref Range    Sodium 136 135 - 145 mmol/L    Potassium 4.2 3.5 - 5.1 mmol/L    Chloride 108 98 - 111 mmol/L    CO2 23 22 - 32 mmol/L    Glucose, Bld 143 (H) 70 - 99 mg/dL      Comment: Glucose reference range applies only to samples taken after fasting for at least 8 hours.    BUN 16 6 - 20 mg/dL    Creatinine, Ser 1.39 (H) 0.61 - 1.24 mg/dL    Calcium 8.3 (L) 8.9 - 10.3 mg/dL    GFR, Estimated 59 (L) >60 mL/min      Comment: (NOTE) Calculated using the CKD-EPI Creatinine Equation (2021)      Anion gap 5 5 - 15      Comment: Performed at [facility] [address] [place], [place] [address]  CBC with Differential/Platelet     Status: Abnormal (Preliminary result)    Collection Time: 12/21/20  5:33 AM   Result Value Ref Range    WBC 2.9 (L) 4.0 - 10.5 K/uL    RBC 3.25 (L) 4.22 - 5.81 MIL/uL    Hemoglobin 10.1 (L) 13.0 - 17.0 g/dL    HCT 28.7 (L) 39.0 - 52.0 %    MCV 88.3 80.0 - 100.0 fL    MCH 31.1 26.0 - 34.0 pg    MCHC 35.2 30.0 - 36.0 g/dL    RDW 12.1 11.5 - 15.5 %    Platelets PENDING 150 - 400 K/uL    nRBC 0.0 0.0 - 0.2 %      Comment: Performed at [facility], [address], [place], [place] [address]    Neutrophils Relative % PENDING %    Neutro Abs PENDING 1.7 - 7.7 K/uL    Band Neutrophils PENDING %    Lymphocytes Relative PENDING %    Lymphs Abs PENDING 0.7 - 4.0 K/uL    Monocytes Relative PENDING %    Monocytes Absolute PENDING 0.1 - 1.0 K/uL    Eosinophils Relative PENDING %    Eosinophils Absolute PENDING 0.0 - 0.5 K/uL    Basophils Relative PENDING %    Basophils Absolute PENDING 0.0 - 0.1 K/uL    WBC Morphology PENDING      RBC Morphology PENDING      Smear Review PENDING      Other PENDING %    nRBC PENDING 0 /100 WBC    Metamyelocytes Relative PENDING %    Myelocytes PENDING %    Promyelocytes Relative PENDING %    Blasts PENDING %    Immature Granulocytes PENDING %    Abs Immature Granulocytes PENDING 0.00 - 0.07 K/uL  Valproic acid level     Status: None    Collection Time: 12/21/20  5:33 AM  Result Value Ref Range    Valproic Acid Lvl 54 50.0 - 100.0 ug/mL      Comment: Performed at [facility] [address], [place], [place] [address]  Glucose, capillary     Status: Abnormal    Collection Time: 12/21/20  6:28 AM  Result Value Ref Range    Glucose-Capillary 145 (H) 70 - 99 mg/dL      Comment: Glucose reference range applies only to samples taken after fasting for at least 8 hours.       Imaging Results (Last 48 hours)  VAS [place] GROIN PSEUDOANEURYSM   Result Date: 12/19/2020  [facility]  Patient Name:  LUTHUR LINDMARK  Date of Exam:   12/19/2020 Medical Rec #: [id number]          Accession #:    [id number] Date of  Birth: [birth year]           Patient Gender: M Patient Age:   54Y Exam Location:  [facility] Procedure:      VAS [place] Gloriajean Dell Referring Phys: [id number] [place] --------------------------------------------------------------------------------  Exam: Right groin Indications: Decreased hematocrit. History: S/p right groin access. Comparison Study: No prior study Performing Technologist: Maudry Mayhew MHA, RDMS, RVT, RDCS  Examination Guidelines: A complete evaluation includes B-mode imaging, spectral Doppler, color Doppler, and power Doppler as needed of all accessible portions of each vessel. Bilateral testing is considered an integral part of a complete examination. Limited examinations for reoccurring indications may be performed as noted. +------------+----------+--------+------+----------+ Right DuplexPSV (cm/s)WaveformPlaqueComment(s) +------------+----------+--------+------+----------+ CFA            224                             +------------+----------+--------+------+----------+ Prox SFA       159                             +------------+----------+--------+------+----------+  Summary: No evidence of pseudoaneurysm, AVF or DVT    --------------------------------------------------------------------------------    Preliminary              Medical Problem List and Plan: 1.  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  Antithrombotics: -DVT/anticoagulation: Lovenox             -antiplatelet therapy: Brilinta 90 mg twice daily 3. Pain Management: Tylenol as needed 4. Mood: Lexapro 20 mg daily, melatonin 10 mg nightly as needed             -antipsychotic agents: N/A 5. Neuropsych: This patient is not capable of making decisions on his own behalf. 6. Skin/Wound Care:  Routine skin checks 7. Fluids/Electrolytes/Nutrition: Routine in and outs with follow-up chemistries 8.  Epilepsy/seizures, Lamictal 50 mg twice daily, Depakote 1000 mg every 12 hours.  EEG negative for seizure 9.  Hyperlipidemia.  Lipitor 10.  Hypertension.  Norvasc 10 mg daily, HCTZ 25 mg daily Imdur 30 mg daily.  Monitor with increased mobility 11.  Diabetes mellitus.  Hemoglobin A1c 9.7.  SSI.  Patient on Lantus 40 units nightly prior to admission.  Resume as needed             5/23 reasonable control at present 12.  CAD with myocardial infarction.  Patient now on Brilinta 13.  History of tobacco polysubstance abuse.  Urine drug screen positive marijuana.  Provide counseling 14.  AKI on CKD admission creatinine 2.15 improved to 1.39.  Follow-up chemistries             -encourage adequate intake             -wasn't motivated to eat much while I was in room today. 15.  Questionable medical noncompliance.  Provide counseling 16. Lethargy: check admission lab work tomorrow morning including vpa level    -monitor sleep chart   Cathlyn Parsons, PA-C 12/21/2020  I have personally performed a face to face diagnostic evaluation of this patient and formulated the key components of the plan.  Additionally, I have personally reviewed laboratory data, imaging studies, as well as relevant notes and concur with the physician assistant's documentation above.  The patient's status has not changed from the original H&P.  Any changes in documentation from the acute care chart have been noted above.  Meredith Staggers, MD, [facility]

## 2020-12-22 ENCOUNTER — Ambulatory Visit: Payer: Medicaid Other | Admitting: "Endocrinology

## 2020-12-22 DIAGNOSIS — I63512 Cerebral infarction due to unspecified occlusion or stenosis of left middle cerebral artery: Secondary | ICD-10-CM | POA: Diagnosis not present

## 2020-12-22 LAB — CBC WITH DIFFERENTIAL/PLATELET
Abs Immature Granulocytes: 0 10*3/uL (ref 0.00–0.07)
Basophils Absolute: 0 10*3/uL (ref 0.0–0.1)
Basophils Relative: 1 %
Eosinophils Absolute: 0.1 10*3/uL (ref 0.0–0.5)
Eosinophils Relative: 3 %
HCT: 32 % — ABNORMAL LOW (ref 39.0–52.0)
Hemoglobin: 11.1 g/dL — ABNORMAL LOW (ref 13.0–17.0)
Immature Granulocytes: 0 %
Lymphocytes Relative: 37 %
Lymphs Abs: 1.1 10*3/uL (ref 0.7–4.0)
MCH: 30.7 pg (ref 26.0–34.0)
MCHC: 34.7 g/dL (ref 30.0–36.0)
MCV: 88.6 fL (ref 80.0–100.0)
Monocytes Absolute: 0.7 10*3/uL (ref 0.1–1.0)
Monocytes Relative: 23 %
Neutro Abs: 1 10*3/uL — ABNORMAL LOW (ref 1.7–7.7)
Neutrophils Relative %: 36 %
Platelets: 137 10*3/uL — ABNORMAL LOW (ref 150–400)
RBC: 3.61 MIL/uL — ABNORMAL LOW (ref 4.22–5.81)
RDW: 12 % (ref 11.5–15.5)
WBC: 2.8 10*3/uL — ABNORMAL LOW (ref 4.0–10.5)
nRBC: 0 % (ref 0.0–0.2)

## 2020-12-22 LAB — COMPREHENSIVE METABOLIC PANEL
ALT: 19 U/L (ref 0–44)
AST: 29 U/L (ref 15–41)
Albumin: 2.2 g/dL — ABNORMAL LOW (ref 3.5–5.0)
Alkaline Phosphatase: 38 U/L (ref 38–126)
Anion gap: 9 (ref 5–15)
BUN: 17 mg/dL (ref 6–20)
CO2: 20 mmol/L — ABNORMAL LOW (ref 22–32)
Calcium: 8.2 mg/dL — ABNORMAL LOW (ref 8.9–10.3)
Chloride: 105 mmol/L (ref 98–111)
Creatinine, Ser: 1.37 mg/dL — ABNORMAL HIGH (ref 0.61–1.24)
GFR, Estimated: >60 mL/min (ref >60)
Glucose, Bld: 100 mg/dL — ABNORMAL HIGH (ref 70–99)
Potassium: 4.2 mmol/L (ref 3.5–5.1)
Sodium: 134 mmol/L — ABNORMAL LOW (ref 135–145)
Total Bilirubin: 0.9 mg/dL (ref 0.3–1.2)
Total Protein: 5 g/dL — ABNORMAL LOW (ref 6.5–8.1)

## 2020-12-22 LAB — GLUCOSE, CAPILLARY
Glucose-Capillary: 88 mg/dL (ref 70–99)
Glucose-Capillary: 226 mg/dL — ABNORMAL HIGH (ref 70–99)
Glucose-Capillary: 175 mg/dL — ABNORMAL HIGH (ref 70–99)
Glucose-Capillary: 168 mg/dL — ABNORMAL HIGH (ref 70–99)

## 2020-12-22 LAB — VALPROIC ACID LEVEL: Valproic Acid Lvl: 71 ug/mL (ref 50.0–100.0)

## 2020-12-22 NOTE — Evaluation (Signed)
Speech Language Pathology Assessment and Plan  Patient Details  Name: Melvin Taylor MRN: [id number] Date of Birth: October 01, 1965  SLP Diagnosis: Aphasia;Cognitive Impairments;Speech and Language deficits  Rehab Potential: Fair ELOS: 7-10 days    Today's Date: 12/22/2020 SLP Individual Time: 1330-1430 SLP Individual Time Calculation (min): 60 min   Hospital Problem: Active Problems:   Left middle cerebral artery stroke [facility])  Past Medical History:  Past Medical History:  Diagnosis Date  . Anxiety   . Bipolar 1 disorder ([place])   . Bone spur    left heel  . Cervical radiculopathy   . Chest pain   . Chronic back pain   . Chronic chest wall pain   . Chronic neck pain   . Coronary artery disease    a. s/p CABG in 03/2016 with LIMA-LAD, SVG-D1, SVG-RCA, and Seq SVG-mid and distal OM  . Depression   . Diabetes mellitus    Type II  . Hallucinations    "long history of them"  . Headache(784.0)   . History of gout   . HTN (hypertension)   . Insomnia   . Myocardial infarction ([place]) 2017  . Neuropathy   . Pain management   . Polysubstance abuse ([place])   . Right leg pain    chronic  . Schizophrenia ([place])   . Seizures ([place])    last sz between July 5-9th, 2016; epilepsy  . Shortness of breath dyspnea    with exertion  . Sleep apnea   . Transfusion of blood product refused for religious reason    Past Surgical History:  Past Surgical History:  Procedure Laterality Date  . ANTERIOR CERVICAL DECOMP/DISCECTOMY FUSION N/A 12/14/2015   Procedure: ANTERIOR CERVICAL DECOMPRESSION/DISCECTOMY FUSION CERVICAL FIVE -SIX;  Surgeon: Earnie Larsson, MD;  Location: [place] NEURO ORS;  Service: Neurosurgery;  Laterality: N/A;  . BIOPSY  05/07/2015   Procedure: BIOPSY (Gastric);  Surgeon: [name], MD;  Location: [facility];  Service: Endoscopy;;  . CARDIAC CATHETERIZATION N/A 03/07/2016   Procedure: Left Heart Cath and Coronary Angiography;  Surgeon: Belva Crome, MD;  Location: [place] CV LAB;   Service: Cardiovascular;  Laterality: N/A;  . COLONOSCOPY WITH PROPOFOL N/A 05/07/2015   [id number] diverticulosis, multiple colon polyps removed, tubular adenoma, serrated colon polyp. Next colonoscopy October 2019  . COLONOSCOPY WITH PROPOFOL N/A 08/02/2018   Procedure: COLONOSCOPY WITH PROPOFOL;  Surgeon: [name], MD;  Location: [facility];  Service: Endoscopy;  Laterality: N/A;  12:00pm  . CORONARY ARTERY BYPASS GRAFT N/A 03/28/2016   Procedure: CORONARY ARTERY BYPASS GRAFTING (CABG) x 5 USING GREATER SAPHENOUS VEIN;  Surgeon: [name], MD;  Location: [place] OR;  Service: Open Heart Surgery;  Laterality: N/A;  . ENDOVEIN HARVEST OF GREATER SAPHENOUS VEIN Right 03/28/2016   Procedure: ENDOVEIN HARVEST OF GREATER SAPHENOUS VEIN;  Surgeon: [name], MD;  Location: [place];  Service: Open Heart Surgery;  Laterality: Right;  . ESOPHAGEAL DILATION N/A 05/07/2015   Procedure: ESOPHAGEAL DILATION WITH 56FR MALONEY DILATOR;  Surgeon: [name], MD;  [facility];  Service: Endoscopy;  Laterality: N/A;  . ESOPHAGOGASTRODUODENOSCOPY (EGD) WITH PROPOFOL N/A 05/07/2015   RMR: Status post dilation of normal esophagus. Gastritis.  . IR CT HEAD LTD  12/17/2020  . IR CT HEAD LTD  12/17/2020  . IR PERCUTANEOUS ART THROMBECTOMY/INFUSION INTRACRANIAL INC DIAG ANGIO  12/17/2020  . KNEE SURGERY Left    arthroscopy  . MANDIBLE FRACTURE SURGERY    . POLYPECTOMY  05/07/2015   Procedure: POLYPECTOMY (Hepatic Flexure, Distal Transverse Colon, Rectal);  Surgeon: [name], MD;  Location[facility];  Service: Endoscopy;;  . RADIOLOGY WITH ANESTHESIA N/A 12/16/2020   Procedure: IR WITH ANESTHESIA;  Surgeon: Luanne Bras, MD;  Location: [place];  Service: Radiology;  Laterality: N/A;  . TEE WITHOUT CARDIOVERSION N/A 03/28/2016   Procedure: TRANSESOPHAGEAL ECHOCARDIOGRAM (TEE);  Surgeon: [name], MD;  Location: [place];  Service: Open Heart Surgery;  Laterality: N/A;  . TONSILLECTOMY       Assessment / Plan / Recommendation Clinical Impression Patient is a 55 y.o. year old male with history of hypertension hyperlipidemia, CAD with myocardial infarction maintained on Plavix, epilepsy/seizures maintained on Depakote as well as Lamictal, schizophrenia, diabetes mellitus, tobacco abuse as well as polysubstance abuse questionable medical compliance. Per chart review patient lives alone. 1 level home. Has a PCA 4 hours a day 6 days a week. Independent with assistive device. He has a sister in the area. Presented 12/16/2020 with acute onset of right-sided weakness and speech difficulties. CT/MRI showed scattered acute infarcts within the left MCA and MCA/PCA watershed territories affecting the cortical/subcortical left frontal parietal and occipital lobe. Multiple acute infarcts also present within the left insula and basal ganglia. No significant mass-effect. No hemorrhagic conversion. MRA demonstrated moderate stenosis within the proximal M1 and right MCA. Severe stenosis within the P2 and right PCA. Patient underwent mechanical thrombectomy per interventional radiology. Echocardiogram with ejection fraction of 65 to 38% grade 1 diastolic dysfunction. EEG negative for seizure. Admission chemistries glucose 175, creatinine 2.15, hemoglobin 12, hemoglobin A1c 9.7, urine drug screen was positive for marijuana. Currently maintained on Brilinta for CVA prophylaxis, no aspirin secondary to questionable allergy. Maintained on Lovenox for DVT prophylaxis. Therapy evaluations completed due to patient's right side weakness and speech difficulties was admitted for a comprehensive rehab program. Patient currently requires min with mobility secondary to muscle weakness, decreased cardiorespiratoy endurance, field cut, decreased attention to right and decreased motor planning, decreased initiation, decreased attention, decreased awareness, decreased problem solving, decreased safety awareness,  decreased memory and delayed processing and decreased sitting balance, decreased standing balance, decreased postural control, hemiplegia and decreased balance strategies.  Prior to hospitalization, patient was independent  with mobility and lived with Alone in a [place] home.  Home access is 3Stairs to enter.  Pt presents with moderate expressive and receptive aphasia along with noted cognitive impairments, including reduced intellectual awareness, basic problem solving, and short term recall; further impacted by lethargy and delayed processing. Pt was able to recall call bell after 5 minute intervals but not after 10 minutes. Pt demonstrated poor safety awareness, inconsistent intellectual awareness and mild right inattention. Expressively pt can speak at word/simple phrase level with halting speech due to delayed processing and word finding errors with semantic/phonetic paraphasia. Pt was able to name 7 out 10 items common items, name 3 out 6 object function and repeat at sentence level. Receptively pt was able to answer simple yes/no questions in 4 out 5 opportunities, complex yes/no questions in 3 out 5 opportunities, identify objects in a field of 2 in 4 out4 opportunities and in a field of 3 in 2 out 4 opportunities. Pt was able to read at word level, however demonstrated impaired comprehension in ability to select object to match word in 2 out 5 opportunities. Pt demonstrated poor writing legibility of name with RUE weakness. Pt denies changes to speech or swallow function. Pt would benefit from skilled ST services in  order to maximize functional independence and reduce burden of care, requiring 24 hour supervision at discharge with continued skilled ST services.   Skilled Therapeutic Interventions          Skilled ST services focused on cognitive skills. SLP facilitated administration of cognitive linguistic formal assessment and provided education of results. SLP and pt collaborated to set goals for  cognitive linguistic needs during length of stay. All questions answered to satisfaction.  Pt was left in room with call bell within reach and chair alarm set. SLP recommends to continue skilled services.  SLP Assessment  Patient will need skilled [place] Pathology Services during CIR admission    Recommendations  Patient destination: Home Follow up Recommendations: Home Health SLP;Outpatient SLP;24 hour supervision/assistance Equipment Recommended: None recommended by SLP    SLP Frequency 3 to 5 out of 7 days   SLP Duration  SLP Intensity  SLP Treatment/Interventions 7-10 days  Minumum of 1-2 x/day, 30 to 90 minutes  Cognitive remediation/compensation;Cueing hierarchy;Environmental controls;Internal/external aids;Functional tasks;Patient/family education;Speech/Language facilitation    Pain Pain Assessment Pain Score: 0-No pain  Prior Functioning Cognitive/Linguistic Baseline: Information not available Type of Home: Apartment  Lives With: Alone Available Help at Discharge: Family;Personal care attendant Vocation: Unemployed  SLP Evaluation Cognition Overall Cognitive Status: Impaired/Different from baseline Arousal/Alertness: Lethargic Orientation Level: Oriented to person Attention: Sustained Sustained Attention: Impaired Sustained Attention Impairment: Verbal basic Memory: Impaired Memory Impairment: Decreased recall of new information Awareness: Impaired Awareness Impairment: Intellectual impairment;Emergent impairment Problem Solving: Impaired Problem Solving Impairment: Verbal basic;Functional basic Safety/Judgment: Impaired  Comprehension Auditory Comprehension Overall Auditory Comprehension: Impaired Commands: Impaired One Step Basic Commands: 50-74% accurate Conversation: Simple Interfering Components: Attention Visual Recognition/Discrimination Discrimination: Exceptions to [facility] Common Objects: Able in field of 2 Reading Comprehension Reading  Status: Impaired Word level: Impaired Interfering Components: Attention;Right neglect/inattention;Processing time Expression Expression Primary Mode of Expression: Verbal Verbal Expression Overall Verbal Expression: Impaired Initiation: No impairment Level of Generative/Spontaneous Verbalization: Sentence Repetition: No impairment Naming: Impairment Confrontation: Impaired (7/10 common objects) Common Objects: Able in field of 2 Verbal Errors: Phonemic paraphasias;Semantic paraphasias Pragmatics: No impairment Written Expression Dominant Hand: Right Written Expression: Exceptions to [facility] Oral Motor Oral Motor/Sensory Function Overall Oral Motor/Sensory Function: Within functional limits Motor Speech Overall Motor Speech: Appears within functional limits for tasks assessed Intelligibility: Intelligible Motor Planning: Witnin functional limits  Care Tool Care Tool Cognition Expression of Ideas and Wants Expression of Ideas and Wants: Frequent difficulty - frequently exhibits difficulty with expressing needs and ideas   Understanding Verbal and Non-Verbal Content Understanding Verbal and Non-Verbal Content: Sometimes understands - understands only basic conversations or simple, direct phrases. Frequently requires cues to understand   Memory/Recall Ability *first 3 days only      Short Term Goals: Week 1: SLP Short Term Goal 1 (Week 1): STG=LTG due to short ELOS  Refer to Care Plan for Long Term Goals  Recommendations for other services: None   Discharge Criteria: Patient will be discharged from SLP if patient refuses treatment 3 consecutive times without medical reason, if treatment goals not met, if there is a change in medical status, if patient makes no progress towards goals or if patient is discharged from hospital.  The above assessment, treatment plan, treatment alternatives and goals were discussed and mutually agreed upon: by patient  Antjuan Rothe  [facility] 12/22/2020,  4:43 PM

## 2020-12-22 NOTE — Evaluation (Signed)
Occupational Therapy Assessment and Plan  Patient Details  Name: Melvin Taylor MRN: [id number] Date of Birth: November 03, 1965  OT Diagnosis: abnormal posture, altered mental status, cognitive deficits, hemiplegia affecting dominant side and muscle weakness (generalized) Rehab Potential: Rehab Potential (ACUTE ONLY): Excellent ELOS: 5-7 days   Today's Date: 12/22/2020 OT Individual Time: 1324-4010 OT Individual Time Calculation (min): 60 min     Hospital Problem: Active Problems:   Left middle cerebral artery stroke [facility])   Past Medical History:  Past Medical History:  Diagnosis Date  . Anxiety   . Bipolar 1 disorder ([place])   . Bone spur    left heel  . Cervical radiculopathy   . Chest pain   . Chronic back pain   . Chronic chest wall pain   . Chronic neck pain   . Coronary artery disease    a. s/p CABG in 03/2016 with LIMA-LAD, SVG-D1, SVG-RCA, and Seq SVG-mid and distal OM  . Depression   . Diabetes mellitus    Type II  . Hallucinations    "long history of them"  . Headache(784.0)   . History of gout   . HTN (hypertension)   . Insomnia   . Myocardial infarction ([place]) 2017  . Neuropathy   . Pain management   . Polysubstance abuse ([place])   . Right leg pain    chronic  . Schizophrenia ([place])   . Seizures ([place])    last sz between July 5-9th, 2016; epilepsy  . Shortness of breath dyspnea    with exertion  . Sleep apnea   . Transfusion of blood product refused for religious reason    Past Surgical History:  Past Surgical History:  Procedure Laterality Date  . ANTERIOR CERVICAL DECOMP/DISCECTOMY FUSION N/A 12/14/2015   Procedure: ANTERIOR CERVICAL DECOMPRESSION/DISCECTOMY FUSION CERVICAL FIVE -SIX;  Surgeon: Earnie Larsson, MD;  Location: [place] NEURO ORS;  Service: Neurosurgery;  Laterality: N/A;  . BIOPSY  05/07/2015   Procedure: BIOPSY (Gastric);  Surgeon: [name], MD;  Location: [facility];  Service: Endoscopy;;  . CARDIAC CATHETERIZATION N/A 03/07/2016   Procedure: Left  Heart Cath and Coronary Angiography;  Surgeon: Belva Crome, MD;  Location: [place] CV LAB;  Service: Cardiovascular;  Laterality: N/A;  . COLONOSCOPY WITH PROPOFOL N/A 05/07/2015   UVO:[id number] diverticulosis, multiple colon polyps removed, tubular adenoma, serrated colon polyp. Next colonoscopy October 2019  . COLONOSCOPY WITH PROPOFOL N/A 08/02/2018   Procedure: COLONOSCOPY WITH PROPOFOL;  Surgeon: [name], MD;  [facility];  Service: Endoscopy;  Laterality: N/A;  12:00pm  . CORONARY ARTERY BYPASS GRAFT N/A 03/28/2016   Procedure: CORONARY ARTERY BYPASS GRAFTING (CABG) x 5 USING GREATER SAPHENOUS VEIN;  Surgeon: [name], MD;  Location: [place] OR;  Service: Open Heart Surgery;  Laterality: N/A;  . ENDOVEIN HARVEST OF GREATER SAPHENOUS VEIN Right 03/28/2016   Procedure: ENDOVEIN HARVEST OF GREATER SAPHENOUS VEIN;  Surgeon: [name], MD;  Location: [place];  Service: Open Heart Surgery;  Laterality: Right;  . ESOPHAGEAL DILATION N/A 05/07/2015   Procedure: ESOPHAGEAL DILATION WITH 56FR MALONEY DILATOR;  Surgeon: [name], MD;  Location: [facility];  Service: Endoscopy;  Laterality: N/A;  . ESOPHAGOGASTRODUODENOSCOPY (EGD) WITH PROPOFOL N/A 05/07/2015   RMR: Status post dilation of normal esophagus. Gastritis.  . IR CT HEAD LTD  12/17/2020  . IR CT HEAD LTD  12/17/2020  . IR PERCUTANEOUS ART THROMBECTOMY/INFUSION INTRACRANIAL INC DIAG ANGIO  12/17/2020  . KNEE SURGERY Left  arthroscopy  . MANDIBLE FRACTURE SURGERY    . POLYPECTOMY  05/07/2015   Procedure: POLYPECTOMY (Hepatic Flexure, Distal Transverse Colon, Rectal);  Surgeon: [name], MD;  Location[facility];  Service: Endoscopy;;  . RADIOLOGY WITH ANESTHESIA N/A 12/16/2020   Procedure: IR WITH ANESTHESIA;  Surgeon: Luanne Bras, MD;  Location: [place];  Service: Radiology;  Laterality: N/A;  . TEE WITHOUT CARDIOVERSION N/A 03/28/2016   Procedure: TRANSESOPHAGEAL ECHOCARDIOGRAM (TEE);  Surgeon: [name], MD;  Location: [place];  Service: Open Heart Surgery;  Laterality: N/A;  . TONSILLECTOMY      Assessment & Plan Clinical Impression: Patient is a 55 y.o. year old male with recent admission to the hospital on.12/16/2020 with acute onset of right-sided weakness and speech difficulties. CT/MRI showed scattered acute infarcts within the left MCA and MCA/PCA watershed territories affecting the cortical/subcortical left frontal parietal and occipital lobe. Multiple acute infarcts also present within the left insula and basal ganglia. No significant mass-effect. No hemorrhagic conversion. MRA demonstrated moderate stenosis within the proximal M1 and right MCA. Severe stenosis within the P2 and right PCA. Patient underwent mechanical thrombectomy per interventional radiology. Echocardiogram with ejection fraction of 65 to 90% grade 1 diastolic dysfunction.   Patient transferred to CIR on 12/21/2020 .    Patient currently requires min with basic self-care skills and IADL secondary to muscle weakness and muscle paralysis, impaired timing and sequencing, unbalanced muscle activation and decreased coordination, field cut, decreased attention to right, decreased initiation, decreased attention, decreased awareness, decreased problem solving, decreased safety awareness, decreased memory and delayed processing and decreased standing balance, hemiplegia and decreased balance strategies.  Prior to hospitalization, patient could complete ADLs with independent .  Patient will benefit from skilled intervention to decrease level of assist with basic self-care skills and increase independence with basic self-care skills prior to discharge home with assist from an aide and other family members .  Anticipate patient will require 24 hour supervision and follow up home health.  OT - End of Session Activity Tolerance: Improving Endurance Deficit: Yes OT Assessment Rehab Potential (ACUTE ONLY): Excellent OT  Barriers to Discharge: Decreased caregiver support OT Barriers to Discharge Comments: will need 24 hour supervision for safety OT Patient demonstrates impairments in the following area(s): Balance;Motor;Endurance;Cognition;Safety;Perception;Vision OT Basic ADL's Functional Problem(s): Eating;Grooming;Bathing;Dressing;Toileting OT Advanced ADL's Functional Problem(s): Simple Meal Preparation OT Transfers Functional Problem(s): Toilet;Tub/Shower OT Additional Impairment(s): Fuctional Use of Upper Extremity OT Plan OT Intensity: Minimum of 1-2 x/day, 45 to 90 minutes OT Frequency: 5 out of 7 days OT Duration/Estimated Length of Stay: 5-7 days OT Treatment/Interventions: Balance/vestibular training;Discharge planning;Self Care/advanced ADL retraining;Therapeutic Activities;UE/LE Coordination activities;Functional mobility training;Patient/family education;Therapeutic Exercise;Community reintegration;DME/adaptive equipment instruction;Neuromuscular re-education;UE/LE Strength taining/ROM;Cognitive remediation/compensation;Disease mangement/prevention;Pain management;Visual/perceptual remediation/compensation;Psychosocial support OT Self Feeding Anticipated Outcome(s): independent OT Basic Self-Care Anticipated Outcome(s): supervision OT Toileting Anticipated Outcome(s): modified independent OT Bathroom Transfers Anticipated Outcome(s): supervision OT Recommendation Recommendations for Other Services: Neuropsych consult Patient destination: Home Follow Up Recommendations: 24 hour supervision/assistance;Home health OT Equipment Recommended: To be determined   OT Evaluation Precautions/Restrictions  Precautions Precautions: Fall Precaution Comments: R side inattention Restrictions Weight Bearing Restrictions: No  Pain Pain Assessment Pain Score: 0-No pain Home Living/Prior Functioning Home Living Family/patient expects to be discharged to:: Private residence Living Arrangements:  Alone Available Help at Discharge: Family,Personal care attendant Type of Home: Apartment Home Access: Stairs to enter [employer] of Steps: 3 Entrance Stairs-Rails: Right Home Layout: One level Bathroom Shower/Tub: Chiropodist: Standard Bathroom Accessibility: Yes  Lives With: Alone IADL History Homemaking Responsibilities: Yes Meal Prep Responsibility: Secondary Current License: No Occupation: On disability Prior Function Level of Independence: Independent with basic ADLs  Able to Take Stairs?: Yes Driving: No Vocation: On disability Vision Baseline Vision/History: Wears glasses Wears Glasses: At all times Patient Visual Report: No change from baseline Vision Assessment?: Yes Eye Alignment: Within Functional Limits Ocular Range of Motion: Within Functional Limits Tracking/Visual Pursuits: Decreased smoothness of horizontal tracking;Decreased smoothness of vertical tracking Convergence: Within functional limits Visual Fields: Right visual field deficit Perception  Perception: Impaired Inattention/Neglect: Does not attend to right visual field Praxis Praxis: Impaired Praxis Impairment Details: Initiation Cognition Overall Cognitive Status: Impaired/Different from baseline Arousal/Alertness: Awake/alert Orientation Level: Person;Place;Situation Person: Oriented Place: Disoriented Situation: Disoriented Year: Other (Comment) (unable to state) Day of Week: Incorrect (Wednesday) Memory: Impaired Memory Impairment: Storage deficit;Decreased short term memory Decreased Short Term Memory: Verbal basic;Functional basic Immediate Memory Recall: Sock;Blue;Bed Memory Recall Sock: Not able to recall Memory Recall Blue: Not able to recall Memory Recall Bed: Not able to recall Attention: Sustained Sustained Attention: Impaired Sustained Attention Impairment: Verbal basic;Functional basic Awareness: Impaired Awareness Impairment: Intellectual  impairment Problem Solving: Impaired Problem Solving Impairment: Functional basic Safety/Judgment: Impaired Sensation Sensation Light Touch: Appears Intact Hot/Cold: Appears Intact Proprioception: Appears Intact Stereognosis: Not tested Additional Comments: Sensation intact in BUEs per gross testing Coordination Gross Motor Movements are Fluid and Coordinated: No Fine Motor Movements are Fluid and Coordinated: No Coordination and Movement Description: Slower functional use of the RUE noted with selfcare tasks compared to the LUE.  Currently, uses at a diminshed assist level for selfcare tasks. Motor  Motor Motor: Hemiplegia;Abnormal tone Motor - Skilled Clinical Observations: slight R hemiparesis noted  Trunk/Postural Assessment  Cervical Assessment Cervical Assessment: Within Functional Limits Thoracic Assessment Thoracic Assessment: Exceptions to [facility] (thoracic kyphosis) Lumbar Assessment Lumbar Assessment: Exceptions to WFL (slight posterior pelvic tilt)  Balance Balance Balance Assessed: Yes Static Sitting Balance Static Sitting - Balance Support: Bilateral upper extremity supported Static Sitting - Level of Assistance: 5: Stand by assistance Dynamic Sitting Balance Dynamic Sitting - Balance Support: During functional activity Dynamic Sitting - Level of Assistance: 5: Stand by assistance Static Standing Balance Static Standing - Balance Support: During functional activity Static Standing - Level of Assistance:  (contact guard assist) Dynamic Standing Balance Dynamic Standing - Balance Support: No upper extremity supported;During functional activity Dynamic Standing - Level of Assistance: 4: Min assist Extremity/Trunk Assessment RUE Assessment RUE Assessment: Exceptions to [facility] Passive Range of Motion (PROM) Comments: WFLs Active Range of Motion (AROM) Comments: WFLs General Strength Comments: Strength overall 3+/5 throughout with decreased FM coordination noted when  attempting to open the soap bottle, with pt dropping it into the washpan on one occassion. LUE Assessment LUE Assessment: Within Functional Limits  Care Tool Care Tool Self Care Eating   Eating Assist Level: Set up assist    Oral Care    Oral Care Assist Level: Supervision/Verbal cueing    Bathing   Body parts bathed by patient: Right arm;Left arm;Chest;Abdomen;Front perineal area;Buttocks;Right upper leg;Left upper leg;Right lower leg;Left lower leg;Face     Assist Level: Minimal Assistance - Patient > 75%    Upper Body Dressing(including orthotics)   What is the patient wearing?: Pull over shirt   Assist Level: Minimal Assistance - Patient > 75%    Lower Body Dressing (excluding footwear)   What is the patient wearing?: Underwear/pull up;Incontinence brief Assist for lower body dressing: Minimal Assistance - Patient > 75%  Putting on/Taking off footwear   What is the patient wearing?: Non-skid slipper socks;Ted hose Assist for footwear: Minimal Assistance - Patient > 75%       Care Tool Toileting Toileting activity   Assist for toileting: Minimal Assistance - Patient > 75%     Care Tool Bed Mobility Roll left and right activity   Roll left and right assist level: Supervision/Verbal cueing    Sit to lying activity   Sit to lying assist level: Supervision/Verbal cueing    Lying to sitting edge of bed activity         Care Tool Transfers Sit to stand transfer   Sit to stand assist level: Contact Guard/Touching assist    Chair/bed transfer   Chair/bed transfer assist level: Minimal Assistance - Patient > 75%     Toilet transfer   Assist Level: Minimal Assistance - Patient > 75%     Care Tool Cognition Expression of Ideas and Wants Expression of Ideas and Wants: Frequent difficulty - frequently exhibits difficulty with expressing needs and ideas   Understanding Verbal and Non-Verbal Content Understanding Verbal and Non-Verbal Content: Sometimes understands  - understands only basic conversations or simple, direct phrases. Frequently requires cues to understand   Memory/Recall Ability *first 3 days only      Refer to Care Plan for Long Term Goals  SHORT TERM GOAL WEEK 1 OT Short Term Goal 1 (Week 1): STGs equal to LTGs set at supervision to modified independent level.  Recommendations for other services: Neuropsych   Skilled Therapeutic Intervention ADL ADL Eating: Set up Where Assessed-Eating: Chair Grooming: Contact guard Where Assessed-Grooming: Standing at sink Upper Body Bathing: Supervision/safety;Contact guard Where Assessed-Upper Body Bathing: Standing at sink Lower Body Bathing: Minimal assistance Where Assessed-Lower Body Bathing: Wheelchair Upper Body Dressing: Minimal assistance Where Assessed-Upper Body Dressing: Standing at sink Lower Body Dressing: Minimal assistance Where Assessed-Lower Body Dressing: Chair Toileting: Minimal assistance Where Assessed-Toileting: Glass blower/designer: Psychiatric nurse Method: Counselling psychologist: Raised toilet seat Mobility  Bed Mobility Bed Mobility: Supine to Sit Supine to Sit: Supervision/Verbal cueing Transfers Sit to Stand: Contact Guard/Touching assist Stand to Sit: Contact Guard/Touching assist   Session Note:  Pt worked on bathing and dressing sit to stand at the sink.  He needed mod instructional cueing for sequencing through task as he would perseverate on washing his front and back peri area in standing with min guard assist.  He was able to donn a pullover shirt with min assist for threading the RUE and pulling down on his back.  He was also able to complete toilet transfer with min assist and no device.  Melvin Taylor was not able to state city as he said "[place]".  He was not oriented to place, time, or situation stating that it was "October".  He was left sitting in the recliner with the call button and phone in reach and safety belt  in place.  Discussed expectations for needing 24 hour supervision at discharge and LOS around 7 days.     Discharge Criteria: Patient will be discharged from OT if patient refuses treatment 3 consecutive times without medical reason, if treatment goals not met, if there is a change in medical status, if patient makes no progress towards goals or if patient is discharged from hospital.  The above assessment, treatment plan, treatment alternatives and goals were discussed and mutually agreed upon: by patient  Mirranda Monrroy OTR/L 12/22/2020, 5:30 PM

## 2020-12-22 NOTE — Progress Notes (Signed)
PROGRESS NOTE   Subjective/Complaints:  No issues overnite Some word finding issues but Y/N appear intact   ROS- neg CP, SOB, N/V/D   Objective:   No results found. Recent Labs    12/21/20 0533 12/22/20 0438  WBC 2.9* 2.8*  HGB 10.1* 11.1*  HCT 28.7* 32.0*  PLT 114* 137*   Recent Labs    12/21/20 0533 12/22/20 0438  NA 136 134*  K 4.2 4.2  CL 108 105  CO2 23 20*  GLUCOSE 143* 100*  BUN 16 17  CREATININE 1.39* 1.37*  CALCIUM 8.3* 8.2*    Intake/Output Summary (Last 24 hours) at 12/22/2020 0728 Last data filed at 12/21/2020 1724 Gross per 24 hour  Intake 240 ml  Output --  Net 240 ml        Physical Exam: Vital Signs Blood pressure (!) 141/73, pulse (!) 52, temperature 98.2 F (36.8 C), temperature source Oral, resp. rate 18, SpO2 100 %.   General: No acute distress Mood and affect are appropriate Heart: Regular rate and rhythm no rubs murmurs or extra sounds Lungs: Clear to auscultation, breathing unlabored, no rales or wheezes Abdomen: Positive bowel sounds, soft nontender to palpation, nondistended Extremities: No clubbing, cyanosis, or edema Skin: No evidence of breakdown, no evidence of rash Neurologic: Cranial nerves II through XII intact, motor strength is 5/5 in Left , 4/5 Right  deltoid, bicep, tricep, grip, hip flexor, knee extensors, ankle dorsiflexor and plantar flexor Sensory exam normal sensation to light touch  in bilateral upper and lower extremities Cerebellar exam normal finger to nose to finger as well as heel to shin in bilateral upper and lower extremities Musculoskeletal: Full range of motion in all 4 extremities. No joint swelling   Assessment/Plan: 1. Functional deficits which require 3+ hours per day of interdisciplinary therapy in a comprehensive inpatient rehab setting.  Physiatrist is providing close team supervision and 24 hour management of active medical problems  listed below.  Physiatrist and rehab team continue to assess barriers to discharge/monitor patient progress toward functional and medical goals  Care Tool:  Bathing              Bathing assist       Upper Body Dressing/Undressing Upper body dressing   What is the patient wearing?: Hospital gown only    Upper body assist Assist Level: Maximal Assistance - Patient 25 - 49%    Lower Body Dressing/Undressing Lower body dressing      What is the patient wearing?: Hospital gown only     Lower body assist Assist for lower body dressing: Maximal Assistance - Patient 25 - 49%     Toileting Toileting    Toileting assist Assist for toileting: Total Assistance - Patient < 25%     Transfers Chair/bed transfer  Transfers assist           Locomotion Ambulation   Ambulation assist              Walk 10 feet activity   Assist           Walk 50 feet activity   Assist           Walk  150 feet activity   Assist           Walk 10 feet on uneven surface  activity   Assist           Wheelchair     Assist               Wheelchair 50 feet with 2 turns activity    Assist            Wheelchair 150 feet activity     Assist          Blood pressure (!) 141/73, pulse (!) 52, temperature 98.2 F (36.8 C), temperature source Oral, resp. rate 18, SpO2 100 %.  Medical Problem List and Plan: 1.  Right side weakness with aphasia secondary to left MCA infarct due to left M1 occlusion status post stenting             -patient may  shower             -ELOS/Goals:  5-8 days, mod I with PT and OT, supervision with SLP 2.  Antithrombotics: -DVT/anticoagulation: Lovenox             -antiplatelet therapy: Brilinta 90 mg twice daily 3. Pain Management: Tylenol as needed 4. Mood: Lexapro 20 mg daily, melatonin 10 mg nightly as needed             -antipsychotic agents: N/A 5. Neuropsych: This patient is not capable of  making decisions on his own behalf. 6. Skin/Wound Care: Routine skin checks 7. Fluids/Electrolytes/Nutrition: Routine in and outs with follow-up chemistries 8.  Epilepsy/seizures, Lamictal 50 mg twice daily, Depakote 1000 mg every 12 hours.  EEG negative for seizure 9.  Hyperlipidemia.  Lipitor 10.  Hypertension.  Norvasc 10 mg daily, HCTZ 25 mg daily Imdur 30 mg daily.  Monitor with increased mobility Vitals:   12/21/20 1932 12/22/20 0412  BP: (!) 182/82 (!) 141/73  Pulse: (!) 56 (!) 52  Resp: 16 18  Temp: 98.6 F (37 C) 98.2 F (36.8 C)  SpO2: 98% 100%  controlled this am  11.  Diabetes mellitus.  Hemoglobin A1c 9.7.  SSI.  Patient on Lantus 40 units nightly prior to admission.  Resume as needed            CBG (last 3)  Recent Labs    12/21/20 1629 12/21/20 2047 12/22/20 0602  GLUCAP 120* 141* 88  controlled 5/24 12.  CAD with myocardial infarction.  Patient now on Brilinta 13.  History of tobacco polysubstance abuse.  Urine drug screen positive marijuana.  Provide counseling 14.  AKI on CKD admission creatinine 2.15 improved to 1.39.  Follow-up chemistries             -encourage adequate intake             -wasn't motivated to eat much while I was in room today. 15.  Questionable medical noncompliance.  Provide counseling 16. Lethargy: check admission lab work tomorrow morning including vpa level    -monitor sleep chart    LOS: 1 days A FACE TO FACE EVALUATION WAS PERFORMED  Charlett Blake 12/22/2020, 7:28 AM

## 2020-12-22 NOTE — Evaluation (Signed)
Physical Therapy Assessment and Plan  Patient Details  Name: Melvin Taylor MRN: [id number] Date of Birth: 1965/10/20  PT Diagnosis: Abnormal posture, Abnormality of gait, Cognitive deficits, Coordination disorder, Difficulty walking, Hemiparesis dominant and Muscle weakness Rehab Potential: Good ELOS: 5-7   Today's Date: 12/22/2020 PT Individual Time: 1100-1156 PT Individual Time Calculation (min): 56 min    Hospital Problem: Active Problems:   Left middle cerebral artery stroke [facility])   Past Medical History:  Past Medical History:  Diagnosis Date  . Anxiety   . Bipolar 1 disorder ([place])   . Bone spur    left heel  . Cervical radiculopathy   . Chest pain   . Chronic back pain   . Chronic chest wall pain   . Chronic neck pain   . Coronary artery disease    a. s/p CABG in 03/2016 with LIMA-LAD, SVG-D1, SVG-RCA, and Seq SVG-mid and distal OM  . Depression   . Diabetes mellitus    Type II  . Hallucinations    "long history of them"  . Headache(784.0)   . History of gout   . HTN (hypertension)   . Insomnia   . Myocardial infarction ([place]) 2017  . Neuropathy   . Pain management   . Polysubstance abuse ([place])   . Right leg pain    chronic  . Schizophrenia ([place])   . Seizures ([place])    last sz between July 5-9th, 2016; epilepsy  . Shortness of breath dyspnea    with exertion  . Sleep apnea   . Transfusion of blood product refused for religious reason    Past Surgical History:  Past Surgical History:  Procedure Laterality Date  . ANTERIOR CERVICAL DECOMP/DISCECTOMY FUSION N/A 12/14/2015   Procedure: ANTERIOR CERVICAL DECOMPRESSION/DISCECTOMY FUSION CERVICAL FIVE -SIX;  Surgeon: Earnie Larsson, MD;  Location: [place] NEURO ORS;  Service: Neurosurgery;  Laterality: N/A;  . BIOPSY  05/07/2015   Procedure: BIOPSY (Gastric);  Surgeon: [name], MD;  Location: [facility] ORS;  Service: Endoscopy;;  . CARDIAC CATHETERIZATION N/A 03/07/2016   Procedure: Left Heart Cath and Coronary  Angiography;  Surgeon: Belva Crome, MD;  Location: [place] CV LAB;  Service: Cardiovascular;  Laterality: N/A;  . COLONOSCOPY WITH PROPOFOL N/A 05/07/2015   AST:[id number] diverticulosis, multiple colon polyps removed, tubular adenoma, serrated colon polyp. Next colonoscopy October 2019  . COLONOSCOPY WITH PROPOFOL N/A 08/02/2018   Procedure: COLONOSCOPY WITH PROPOFOL;  Surgeon: [name], MD;  Location: [facility] ENDO SUITE;  Service: Endoscopy;  Laterality: N/A;  12:00pm  . CORONARY ARTERY BYPASS GRAFT N/A 03/28/2016   Procedure: CORONARY ARTERY BYPASS GRAFTING (CABG) x 5 USING GREATER SAPHENOUS VEIN;  Surgeon: [name], MD;  Location: [place] OR;  Service: Open Heart Surgery;  Laterality: N/A;  . ENDOVEIN HARVEST OF GREATER SAPHENOUS VEIN Right 03/28/2016   Procedure: ENDOVEIN HARVEST OF GREATER SAPHENOUS VEIN;  Surgeon: [name], MD;  Location: [place];  Service: Open Heart Surgery;  Laterality: Right;  . ESOPHAGEAL DILATION N/A 05/07/2015   Procedure: ESOPHAGEAL DILATION WITH 56FR MALONEY DILATOR;  Surgeon: [name], MD;  Location: [facility] ORS;  Service: Endoscopy;  Laterality: N/A;  . ESOPHAGOGASTRODUODENOSCOPY (EGD) WITH PROPOFOL N/A 05/07/2015   RMR: Status post dilation of normal esophagus. Gastritis.  . IR CT HEAD LTD  12/17/2020  . IR CT HEAD LTD  12/17/2020  . IR PERCUTANEOUS ART THROMBECTOMY/INFUSION INTRACRANIAL INC DIAG ANGIO  12/17/2020  . KNEE SURGERY Left    arthroscopy  .  MANDIBLE FRACTURE SURGERY    . POLYPECTOMY  05/07/2015   Procedure: POLYPECTOMY (Hepatic Flexure, Distal Transverse Colon, Rectal);  Surgeon: [name], MD;  Location: [facility];  Service: Endoscopy;;  . RADIOLOGY WITH ANESTHESIA N/A 12/16/2020   Procedure: IR WITH ANESTHESIA;  Surgeon: Luanne Bras, MD;  Location: [place];  Service: Radiology;  Laterality: N/A;  . TEE WITHOUT CARDIOVERSION N/A 03/28/2016   Procedure: TRANSESOPHAGEAL ECHOCARDIOGRAM (TEE);  Surgeon: [name], MD;  Location: [place];   Service: Open Heart Surgery;  Laterality: N/A;  . TONSILLECTOMY      Assessment & Plan Clinical Impression: Patient is a 55 y.o. year old male with history of hypertension hyperlipidemia, CAD with myocardial infarction maintained on Plavix, epilepsy/seizures maintained on Depakote as well as Lamictal, schizophrenia, diabetes mellitus, tobacco abuse as well as polysubstance abuse questionable medical compliance. Per chart review patient lives alone. 1 level home. Has a PCA 4 hours a day 6 days a week. Independent with assistive device. He has a sister in the area. Presented 12/16/2020 with acute onset of right-sided weakness and speech difficulties. CT/MRI showed scattered acute infarcts within the left MCA and MCA/PCA watershed territories affecting the cortical/subcortical left frontal parietal and occipital lobe. Multiple acute infarcts also present within the left insula and basal ganglia. No significant mass-effect. No hemorrhagic conversion. MRA demonstrated moderate stenosis within the proximal M1 and right MCA. Severe stenosis within the P2 and right PCA. Patient underwent mechanical thrombectomy per interventional radiology. Echocardiogram with ejection fraction of 65 to 99% grade 1 diastolic dysfunction. EEG negative for seizure. Admission chemistries glucose 175, creatinine 2.15, hemoglobin 12, hemoglobin A1c 9.7, urine drug screen was positive for marijuana. Currently maintained on Brilinta for CVA prophylaxis, no aspirin secondary to questionable allergy. Maintained on Lovenox for DVT prophylaxis. Therapy evaluations completed due to patient's right side weakness and speech difficulties was admitted for a comprehensive rehab program. Patient currently requires min with mobility secondary to muscle weakness, decreased cardiorespiratoy endurance, field cut, decreased attention to right and decreased motor planning, decreased initiation, decreased attention, decreased awareness,  decreased problem solving, decreased safety awareness, decreased memory and delayed processing and decreased sitting balance, decreased standing balance, decreased postural control, hemiplegia and decreased balance strategies.  Prior to hospitalization, patient was independent  with mobility and lived with Alone in a [place] home.  Home access is 3Stairs to enter.  Patient will benefit from skilled PT intervention to maximize safe functional mobility, minimize fall risk and decrease caregiver burden for planned discharge home with 24 hour supervision.  Anticipate patient will benefit from follow up [place] at discharge.  PT - End of Session Activity Tolerance: Tolerates 10 - 20 min activity with multiple rests Endurance Deficit: Yes PT Assessment Rehab Potential (ACUTE/IP ONLY): Good PT Barriers to Discharge: Decreased caregiver support;Lack of/limited family support;Insurance for SNF coverage PT Patient demonstrates impairments in the following area(s): Balance;Behavior;Endurance;Motor;Nutrition;Pain;Perception;Safety;Sensory;Skin Integrity PT Transfers Functional Problem(s): Bed Mobility;Bed to Chair;Car;Furniture;Floor PT Locomotion Functional Problem(s): Ambulation;Stairs PT Plan PT Intensity: Minimum of 1-2 x/day ,45 to 90 minutes PT Frequency: 5 out of 7 days PT Duration Estimated Length of Stay: 5-7 PT Treatment/Interventions: Ambulation/gait training;DME/adaptive equipment instruction;Psychosocial support;UE/LE Strength taining/ROM;Balance/vestibular training;Functional electrical stimulation;Skin care/wound management;UE/LE Coordination activities;Cognitive remediation/compensation;Functional mobility training;Splinting/orthotics;Visual/perceptual remediation/compensation;Community reintegration;Neuromuscular re-education;Stair training;Wheelchair propulsion/positioning;Discharge planning;Pain management;Therapeutic Activities;Disease management/prevention;Patient/family education;Therapeutic  Exercise PT Transfers Anticipated Outcome(s): spv PT Locomotion Anticipated Outcome(s): spv PT Recommendation Recommendations for Other Services: Neuropsych consult Follow Up Recommendations: Home health PT;24 hour supervision/assistance Patient destination: Home Equipment Recommended: To be  determined   PT Evaluation Precautions/Restrictions Precautions Precautions: (P) Fall Precaution Comments: (P) R side inattention Restrictions Weight Bearing Restrictions: (P) No Home Living/Prior Functioning Home Living Family/patient expects to be discharged to:: Private residence Living Arrangements: Alone Available Help at Discharge: Family;Personal care attendant Type of Home: Apartment Home Access: Stairs to enter [employer] of Steps: 3 Entrance Stairs-Rails: Right Home Layout: One level Bathroom Shower/Tub: Tub/shower unit (got down in the bath tub) Bathroom Toilet: Standard Bathroom Accessibility: Yes  Lives With: Alone Prior Function Level of Independence: Independent with basic ADLs  Able to Take Stairs?: Yes Driving: No Vision/Perception  Perception Perception: Impaired Inattention/Neglect: Does not attend to right visual field Praxis Praxis: Impaired Praxis Impairment Details: Initiation  Cognition Overall Cognitive Status: Impaired/Different from baseline Arousal/Alertness: Awake/alert Orientation Level: Oriented to person Sustained Attention: Impaired Memory: Impaired Awareness: Impaired Problem Solving: Impaired Safety/Judgment: Impaired Sensation Sensation Light Touch: Appears Intact Hot/Cold: Not tested Proprioception: Appears Intact Stereognosis: Appears Intact Coordination Gross Motor Movements are Fluid and Coordinated: No Fine Motor Movements are Fluid and Coordinated: No Heel Shin Test: limited ROM R LE Motor  Motor Motor: Hemiplegia;Abnormal tone Motor - Skilled Clinical Observations: slight R hemi, R dysdiadochokinesia UE, mild  dysmetria R LE   Trunk/Postural Assessment  Cervical Assessment Cervical Assessment: Within Functional Limits Thoracic Assessment Thoracic Assessment: Exceptions to [facility] (increased kyphosis) Lumbar Assessment Lumbar Assessment: Exceptions to [facility] (posterior pelvic tilt) Postural Control Postural Control: Deficits on evaluation Righting Reactions: delayed and inadequate Protective Responses: delayed and inadequate  Balance Balance Balance Assessed: Yes Standardized Balance Assessment Standardized Balance Assessment: Berg Balance Test Berg Balance Test Sit to Stand: Needs minimal aid to stand or to stabilize Standing Unsupported: Able to stand 2 minutes with supervision Sitting with Back Unsupported but Feet Supported on Floor or Stool: Able to sit 2 minutes under supervision Stand to Sit: Controls descent by using hands Transfers: Needs one person to assist Standing Unsupported with Eyes Closed: Able to stand 10 seconds with supervision Standing Ubsupported with Feet Together: Able to place feet together independently but unable to hold for 30 seconds From Standing, Reach Forward with Outstretched Arm: Reaches forward but needs supervision From Standing Position, Pick up Object from Floor: Unable to pick up and needs supervision From Standing Position, Turn to Look Behind Over each Shoulder: Needs supervision when turning Turn 360 Degrees: Needs assistance while turning Standing Unsupported, Alternately Place Feet on Step/Stool: Able to complete >2 steps/needs minimal assist Standing Unsupported, One Foot in Front: Loses balance while stepping or standing Standing on One Leg: Tries to lift leg/unable to hold 3 seconds but remains standing independently Total Score: 21 Static Sitting Balance Static Sitting - Balance Support: Bilateral upper extremity supported Static Sitting - Level of Assistance: 5: Stand by assistance Dynamic Sitting Balance Dynamic Sitting - Balance Support:  During functional activity Dynamic Sitting - Level of Assistance: 5: Stand by assistance Static Standing Balance Static Standing - Balance Support: No upper extremity supported Static Standing - Level of Assistance:  (CGA) Dynamic Standing Balance Dynamic Standing - Balance Support: No upper extremity supported;During functional activity Dynamic Standing - Level of Assistance: 4: Min assist Extremity Assessment      RLE Assessment RLE Assessment: Exceptions to [facility] General Strength Comments: patient with limited ability to follow commands for MMTs, results questionnable RLE Strength Right Hip Flexion: 3-/5 Right Hip ABduction: 3+/5 Right Hip ADduction: 3+/5 Right Knee Flexion: 3+/5 Right Knee Extension: 4-/5 Right Ankle Dorsiflexion: 3/5 Right Ankle Plantar Flexion: 3+/5 LLE Assessment  LLE Assessment: Exceptions to [facility] General Strength Comments: limited ability to follow commands for MMTs, questionnable results LLE Strength Left Hip Flexion: 4-/5 Left Hip ABduction: 4-/5 Left Hip ADduction: 4-/5 Left Knee Flexion: 4-/5 Left Knee Extension: 4-/5 Left Ankle Dorsiflexion: 4-/5 Left Ankle Plantar Flexion: 4-/5  Care Tool Care Tool Bed Mobility Roll left and right activity   Roll left and right assist level: Supervision/Verbal cueing    Sit to lying activity   Sit to lying assist level: Supervision/Verbal cueing    Lying to sitting edge of bed activity   Lying to sitting edge of bed assist level: Contact Guard/Touching assist     Care Tool Transfers Sit to stand transfer   Sit to stand assist level: Contact Guard/Touching assist    Chair/bed transfer   Chair/bed transfer assist level: Minimal Assistance - Patient > 75%     Physiological scientist transfer assist level: Minimal Assistance - Patient > 75%      Care Tool Locomotion Ambulation   Assist level: Minimal Assistance - Patient > 75% Assistive device: No Device Max distance: 87  Walk  10 feet activity   Assist level: Minimal Assistance - Patient > 75% Assistive device: No Device   Walk 50 feet with 2 turns activity   Assist level: Minimal Assistance - Patient > 75% Assistive device: No Device  Walk 150 feet activity Walk 150 feet activity did not occur: Safety/medical concerns      Walk 10 feet on uneven surfaces activity   Assist level: Minimal Assistance - Patient > 75% Assistive device: Hand held assist  Stairs   Assist level: Minimal Assistance - Patient > 75% Stairs assistive device: 1 hand rail Max number of stairs: 12  Walk up/down 1 step activity   Walk up/down 1 step (curb) assist level: Minimal Assistance - Patient > 75% Walk up/down 1 step or curb assistive device: 1 hand rail    Walk up/down 4 steps activity Walk up/down 4 steps assist level: Minimal Assistance - Patient > 75% Walk up/down 4 steps assistive device: 1 hand rail  Walk up/down 12 steps activity   Walk up/down 12 steps assist level: Minimal Assistance - Patient > 75% Walk up/down 12 steps assistive device: 1 hand rail  Pick up small objects from floor   Pick up small object from the floor assist level: Minimal Assistance - Patient > 75%    Wheelchair Will patient use wheelchair at discharge?: No          Wheel 50 feet with 2 turns activity      Wheel 150 feet activity        Refer to Care Plan for Long Term Goals  SHORT TERM GOAL WEEK 1 PT Short Term Goal 1 (Week 1): STG= LTG based on ELOS  Recommendations for other services: Neuropsych  Skilled Therapeutic Intervention Mobility Transfers Transfers: Sit to Stand;Stand to [employer] Transfers Locomotion  Gait Ambulation: Yes Gait Assistance: Minimal Assistance - Patient > 75% Gait Distance (Feet): 87 Feet Assistive device: Rolling walker;None Gait Assistance Details: Verbal cues for precautions/safety;Verbal cues for safe use of DME/AE Gait Gait: Yes Gait Pattern: Impaired (B foot eversion) Gait Pattern:  Step-through pattern;Lateral hip instability;Narrow base of support;Abducted- right Gait velocity: Decreased Stairs / Additional Locomotion Stairs: Yes Stairs Assistance: Minimal Assistance - Patient > 75% Stair Management Technique: One rail Left Number of Stairs: 12 Height of Stairs: 6 Ramp: Minimal Assistance -  Patient >75% Curb: Minimal Assistance - Patient >75% Wheelchair Mobility Wheelchair Mobility: No  Patient received sitting up in recliner, lethargic, but agreeable to PT. He denies pain. Patient grossly CGA/MinA for all functional mobility with apparent R inattention, decreased strength and coordination R hemibody.   Discharge Criteria: Patient will be discharged from PT if patient refuses treatment 3 consecutive times without medical reason, if treatment goals not met, if there is a change in medical status, if patient makes no progress towards goals or if patient is discharged from hospital.  The above assessment, treatment plan, treatment alternatives and goals were discussed and mutually agreed upon: by patient  Debbora Dus 12/22/2020, 12:00 PM

## 2020-12-22 NOTE — Progress Notes (Signed)
Inpatient Rehabilitation  Patient information reviewed and entered into eRehab system by Malakhai Beitler Alain Deschene, OTR/L.   Information including medical coding, functional ability and quality indicators will be reviewed and updated through discharge.    

## 2020-12-22 NOTE — Plan of Care (Signed)
  Problem: RH Comprehension Communication Goal: LTG Patient will comprehend basic/complex auditory (SLP) Description: LTG: Patient will comprehend basic/complex auditory information with cues (SLP). Flowsheets (Taken 12/22/2020 1634) LTG: Patient will comprehend: Basic auditory information LTG: Patient will comprehend auditory information with cueing (SLP): Minimal Assistance - Patient > 75%   Problem: RH Expression Communication Goal: LTG Patient will verbally express basic/complex needs(SLP) Description: LTG:  Patient will verbally express basic/complex needs, wants or ideas with cues  (SLP) Flowsheets (Taken 12/22/2020 1634) LTG: Patient will verbally express basic/complex needs, wants or ideas (SLP): Minimal Assistance - Patient > 75%   Problem: RH Problem Solving Goal: LTG Patient will demonstrate problem solving for (SLP) Description: LTG:  Patient will demonstrate problem solving for basic/complex daily situations with cues  (SLP) Outcome: Not Applicable   Problem: RH Problem Solving Goal: LTG Patient will demonstrate problem solving for (SLP) Description: LTG:  Patient will demonstrate problem solving for basic/complex daily situations with cues  (SLP) Outcome: Not Applicable   Problem: RH Awareness Goal: LTG: Patient will demonstrate awareness during functional activites type of (SLP) Description: LTG: Patient will demonstrate awareness during functional activites type of (SLP) Flowsheets (Taken 12/22/2020 1634) Patient will demonstrate during cognitive/linguistic activities awareness type of: Emergent LTG: Patient will demonstrate awareness during cognitive/linguistic activities with assistance of (SLP): Moderate Assistance - Patient 50 - 74%

## 2020-12-22 NOTE — Progress Notes (Signed)
[place] Individual Statement of Services  Patient Name:  Melvin Taylor  Date:  12/22/2020  Welcome to the [facility].  Our goal is to provide you with an individualized program based on your diagnosis and situation, designed to meet your specific needs.  With this comprehensive rehabilitation program, you will be expected to participate in at least 3 hours of rehabilitation therapies Monday-Friday, with modified therapy programming on the weekends.  Your rehabilitation program will include the following services:  Physical Therapy (PT), Occupational Therapy (OT), Speech Therapy (ST), 24 hour per day rehabilitation nursing, Therapeutic Recreaction (TR), Neuropsychology, Care Coordinator, Rehabilitation Medicine, Nutrition Services, Pharmacy Services and Other  Weekly team conferences will be held on Wednesdays to discuss your progress.  Your Inpatient Rehabilitation Care Coordinator will talk with you frequently to get your input and to update you on team discussions.  Team conferences with you and your family in attendance may also be held.  Expected length of stay: 5-7 Days  Overall anticipated outcome: MOD I  Depending on your progress and recovery, your program may change. Your Inpatient Rehabilitation Care Coordinator will coordinate services and will keep you informed of any changes. Your Inpatient Rehabilitation Care Coordinator's name and contact numbers are listed  below.  The following services may also be recommended but are not provided by the [facility]:    [place] will be made to provide these services after discharge if needed.  Arrangements include referral to agencies that provide these services.  Your insurance has been verified to be:  Medicaid of [place] Your primary doctor is:  [name], FNP  Pertinent information will  be shared with your doctor and your insurance company.  Inpatient Rehabilitation Care Coordinator:  Erlene Quan, [place] or [phone]  Information discussed with and copy given to patient by: Dyanne Iha, 12/22/2020, 12:16 PM

## 2020-12-22 NOTE — Plan of Care (Signed)
  Problem: RH Balance Goal: LTG Patient will maintain dynamic sitting balance (PT) Description: LTG:  Patient will maintain dynamic sitting balance with assistance during mobility activities (PT) Flowsheets (Taken 12/22/2020 1210) LTG: Pt will maintain dynamic sitting balance during mobility activities with:: Independent Goal: LTG Patient will maintain dynamic standing balance (PT) Description: LTG:  Patient will maintain dynamic standing balance with assistance during mobility activities (PT) Flowsheets (Taken 12/22/2020 1210) LTG: Pt will maintain dynamic standing balance during mobility activities with:: Supervision/Verbal cueing   Problem: Sit to Stand Goal: LTG:  Patient will perform sit to stand with assistance level (PT) Description: LTG:  Patient will perform sit to stand with assistance level (PT) Flowsheets (Taken 12/22/2020 1210) LTG: PT will perform sit to stand in preparation for functional mobility with assistance level: Supervision/Verbal cueing   Problem: RH Bed Mobility Goal: LTG Patient will perform bed mobility with assist (PT) Description: LTG: Patient will perform bed mobility with assistance, with/without cues (PT). Flowsheets (Taken 12/22/2020 1210) LTG: Pt will perform bed mobility with assistance level of: Independent   Problem: RH Bed to Chair Transfers Goal: LTG Patient will perform bed/chair transfers w/assist (PT) Description: LTG: Patient will perform bed to chair transfers with assistance (PT). Flowsheets (Taken 12/22/2020 1210) LTG: Pt will perform Bed to Chair Transfers with assistance level: Supervision/Verbal cueing   Problem: RH Car Transfers Goal: LTG Patient will perform car transfers with assist (PT) Description: LTG: Patient will perform car transfers with assistance (PT). Flowsheets (Taken 12/22/2020 1210) LTG: Pt will perform car transfers with assist:: Supervision/Verbal cueing   Problem: RH Floor Transfers Goal: LTG Patient will perform floor  transfers w/assist (PT) Description: LTG: Patient will perform floor transfers with assistance (PT). Flowsheets (Taken 12/22/2020 1210) LTG: PT WILL PERFORM FLOOR TRANFERS  WITH  ASSIST:: Supervision/Verbal cueing   Problem: RH Ambulation Goal: LTG Patient will ambulate in controlled environment (PT) Description: LTG: Patient will ambulate in a controlled environment, # of feet with assistance (PT). Flowsheets (Taken 12/22/2020 1210) LTG: Pt will ambulate in controlled environ  assist needed:: Supervision/Verbal cueing LTG: Ambulation distance in controlled environment: 150 Goal: LTG Patient will ambulate in home environment (PT) Description: LTG: Patient will ambulate in home environment, # of feet with assistance (PT). Flowsheets (Taken 12/22/2020 1210) LTG: Pt will ambulate in home environ  assist needed:: Supervision/Verbal cueing LTG: Ambulation distance in home environment: 150   Problem: RH Stairs Goal: LTG Patient will ambulate up and down stairs w/assist (PT) Description: LTG: Patient will ambulate up and down # of stairs with assistance (PT) Flowsheets (Taken 12/22/2020 1210) LTG: Pt will ambulate up/down stairs assist needed:: Supervision/Verbal cueing LTG: Pt will  ambulate up and down number of stairs: 3 Note: R HR, per home set up

## 2020-12-23 ENCOUNTER — Encounter (HOSPITAL_COMMUNITY): Payer: Self-pay

## 2020-12-23 DIAGNOSIS — I63512 Cerebral infarction due to unspecified occlusion or stenosis of left middle cerebral artery: Secondary | ICD-10-CM | POA: Diagnosis not present

## 2020-12-23 LAB — GLUCOSE, CAPILLARY
Glucose-Capillary: 99 mg/dL (ref 70–99)
Glucose-Capillary: 184 mg/dL — ABNORMAL HIGH (ref 70–99)
Glucose-Capillary: 184 mg/dL — ABNORMAL HIGH (ref 70–99)
Glucose-Capillary: 140 mg/dL — ABNORMAL HIGH (ref 70–99)

## 2020-12-23 NOTE — Progress Notes (Signed)
Physical Therapy Session Note  Patient Details  Name: Melvin Taylor MRN: [id number] Date of Birth: 07/26/1966  Today's Date: 12/23/2020 PT Individual Time: 0930-1025 PT Individual Time Calculation (min): 55 min   Short Term Goals: Week 1:  PT Short Term Goal 1 (Week 1): STG= LTG based on ELOS  Skilled Therapeutic Interventions/Progress Updates:    Patient received sitting up in recliner, agreeable to PT. He denies pain. Patient appearing less lethargic today compared to yesterday, but remains with very flat affect and limited engagement with PT. He was able to ambulate 132ft to ortho gym with no AD and MinA. Increased R foot eversion, decreased stance time R LE, R lateral hip instability noted. Patient completing 8 mins on NuStep for large amplitude reciprocal stepping. BITS completed with the following tasks + CGA provided for postural support. Patient intermittent LOB R when reaching outside BOS R with appropriate, but delayed stepping strategies. Visual scanning with 85% accuracy and 5s reaction time. Simple trail making with numbers 1-25: 8'26" to complete with max verbal cues and 67 errors. Simple maze with 4'26" to attempt to complete and 25 errors. Patient unable to complete simple maze- he would draw lines as if he were navigating through maze and pick up at another random spot and backtrack, but not where he previously ended. Patient completing dynamic balance/weight shifts on Biodex with B UE + CGA. Task was to maintain dot within target. Patient with strong preference for R posterior weight shift, no LOB and limited ability to achieve L anterior weight shift. Patient ambulating back to his room with no AD and CGA/MinA. He had difficulty finding his way back to his room. Returning to recliner, seatbelt alarm on, call light within reach.   Therapy Documentation Precautions:  Precautions Precautions: Fall Precaution Comments: R side inattention Restrictions Weight Bearing Restrictions:  No    Therapy/Group: Individual Therapy  Karoline Caldwell, PT, DPT, CBIS  12/23/2020, 7:41 AM

## 2020-12-23 NOTE — Patient Care Conference (Signed)
Inpatient RehabilitationTeam Conference and Plan of Care Update Date: 12/23/2020   Time: 10:51 AM    Patient Name: Melvin Taylor      Medical Record Number: [id number]  Date of Birth: Dec 08, 1965 Sex: Male         Room/Bed: 4M04C/4M04C-01 Payor Info: Payor: MEDICAID [place] / Plan: MEDICAID [place] ACCESS / Product Type: *No Product type* /    Admit Date/Time:  12/21/2020  2:03 PM  Primary Diagnosis:  Acute ischemic left MCA stroke [facility])  Hospital Problems: Principal Problem:   Acute ischemic left MCA stroke [facility]) Active Problems:   Left middle cerebral artery stroke [facility])    Expected Discharge Date: Expected Discharge Date: 12/30/20  Team Members Present: Physician leading conference: Dr. Alysia Penna Care Coodinator Present: Dorien Chihuahua, RN, BSN, CRRN;Christina Wadsworth, BSW Nurse Present: Dorien Chihuahua, RN PT Present: Estevan Ryder, PT OT Present: Clyda Greener, OT SLP Present: Nadara Mode, SLP PPS Coordinator present : Gunnar Fusi, SLP     Current Status/Progress Goal Weekly Team Focus  Bowel/Bladder             Swallow/Nutrition/ Hydration             ADL's   Min assist for bathing and dressing sit to stand with min assist for transfers without an assistive device.  He demonstrates slight decreased coordination and functional use in the RUE with selfcare tasks as well as right visual field deficit  supervision mostly with modified independent for toileting  selfcare retraining, balance retraining, transfer training, DME education, pt education, therapeutic exercise, therapeutic activities.   Mobility   CGA bed mobility, CGA/MinA STS, stand pivot transfers, gait up ot 76ft no AD, MinA stairs x12 L HR, MinA car transfer  SPV overall, likely no AD  R attention, pt education, dc planning, fam education* needed before dc, gait progressions, dynamic balance, stairs, floor transfer   Communication   Mod A, expressive and receptive aphasia  Min A  wants/needs at  phrase level wording finding strategies, naming objects/function, identifying objects, mildly complex yes/no questions   Safety/Cognition/ Behavioral Observations  Max-Mod A  Min A  only focusing on awareness due to short ELOS and need for language treatment. but impairments in attention, basic problem solving and recall   Pain             Skin               Discharge Planning:  D/C home with aunt and sisters to provide care. Had aide 6 days a week. 24/7   Team Discussion: Mild dysphasia, motor planning issues, right inattention, right field cut and slow cognition level post Lft MCA. More trouble with cognition than physical limitations post stroke. BP slightly elevated; MD monitoring. Patient on target to meet rehab goals: Currently incontinent of bladder at night. Requires supervision for upper body care min assist for lower body bathing and dressing. CGA for stand pivot transfers and able to ambulate 150' without assist vie device using CGA. Up/Down 12 steps with min assist using left railing. SLP currently mod - max for cognition and min assist goals. Overall discharge goals set for supervision.  *See Care Plan and progress notes for long and short-term goals.   Revisions to Treatment Plan:   Teaching Needs: Transfers, toileting, safety, medications, secondary stroke risk management, etc.   Current Barriers to Discharge: Decreased caregiver support and Home enviroment access/layout  Possible Resolutions to Barriers: Patient going home with aunt and sister PCS help 4 hours/day  6 days/week     Medical Summary               I attest that I was present, lead the team conference, and concur with the assessment and plan of the team.   Dorien Chihuahua B 12/23/2020, 2:07 PM

## 2020-12-23 NOTE — Progress Notes (Signed)
Speech Language Pathology Daily Session Note  Patient Details  Name: Melvin Taylor MRN: [id number] Date of Birth: 08/26/1965  Today's Date: 12/23/2020 SLP Individual Time: 0830-0930 SLP Individual Time Calculation (min): 60 min  Short Term Goals: Week 1: SLP Short Term Goal 1 (Week 1): STG=LTG due to short ELOS  Skilled Therapeutic Interventions: Skilled SLP intervetion focused on expressive and receptive language. He was able to respond to questions in conversation with SLP and RN at beginning of session with short but functional phrases. He answered yes/ no questions to communicate information regarding how he took his pills prior to hospitalization. Pt able to read 5-6 word sentences independently with additional time and supervision A. He named 8/10 common objects with no cueing required and was able to name remaining 2 with min phonemic cues.  Pt able to write name with additional time. Decreased legibility noted and min A visual cues for letter omissions (1) in last name. Pt able to read and comprehend written daily schedule in larger print with min A verbal cues and visual cues. Cont with therapy per plan of care.      Pain Pain Assessment Pain Scale: Faces Pain Score: 0-No pain Faces Pain Scale: No hurt  Therapy/Group: Individual Therapy  Darrol Poke Jocelyn Nold 12/23/2020, 9:24 AM

## 2020-12-23 NOTE — Progress Notes (Signed)
Occupational Therapy Session Note  Patient Details  Name: Melvin Taylor MRN: [id number] Date of Birth: 1966/05/11  Today's Date: 12/23/2020 OT Individual Time: 1345-1500 OT Individual Time Calculation (min): 75 min    Short Term Goals: Week 1:  OT Short Term Goal 1 (Week 1): STGs equal to LTGs set at supervision to modified independent level.  Skilled Therapeutic Interventions/Progress Updates:    Pt worked on bathing and dressing to start session.  He was agreeable to completion of shower and was able to undress sit to stand with min assist.  He needed max instructional cueing for sequencing through bathing secondary to not being able to coordinate putting soap on the washcloth initially and instead dumping it out in his hand, before therapist cued him and gave him step by step direction.  He was able to wash most body parts with cueing to sit down for safety as he would try to stand and exhibited one LOB to the right.  He dried off in sitting with cueing for thoroughness and then transferred out to the bedside recliner for dressing.  Min questioning cueing for sequencing dressing with mod instructional cueing for safety as he attempted to stand to place his feet in his brief and pants initially.  He was able to complete donning them from sitting position with supervision and min guard for standing.  He was able to complete donning pullover shirt with supervision after initially trying to donn it on his feet, but realized this without cueing and corrected it.  He was able to complete oral hygiene in standing with min guard assist but with max demonstrational cueing as he could not cognitively coordinate activity.  He would try to spit in the cup he was given for rinsing his mouth instead of in the sink.  Next, had him ambulate down to the [employer] with min assist and no device.  He was able to then complete 2 sets of 1 minute with use of the BITs system for Visual Scanning Program.  He was able to  average 1.9 seconds with 94% accuracy using the non-involved LUE.  He did a good job of scanning left of midline to locate the blue dots, but then on the way back to the room, he ran into the door frame on the left side.  He was not oriented to place or time.  When given choices of bank, store, or hospital he chose bank.  He was able to state stroke for reason for hospitalization when given three choices however.  Finished session with pt in the bed with the call button and phone in reach and safety alarm in place.    Therapy Documentation Precautions:  Precautions Precautions: Fall Precaution Comments: R side inattention Restrictions Weight Bearing Restrictions: No  Pain: Pain Assessment Pain Scale: Faces Pain Score: 0-No pain ADL: See Care Tool Section for some details of mobility and selfcare  Therapy/Group: Individual Therapy  Keaston Pile OTR/L 12/23/2020, 3:56 PM

## 2020-12-23 NOTE — Progress Notes (Signed)
PROGRESS NOTE   Subjective/Complaints:  No issues overnite  Pt feels ok, okk tries to get up per RN  ROS- neg CP, SOB, N/V/D   Objective:   No results found. Recent Labs    12/21/20 0533 12/22/20 0438  WBC 2.9* 2.8*  HGB 10.1* 11.1*  HCT 28.7* 32.0*  PLT 114* 137*   Recent Labs    12/21/20 0533 12/22/20 0438  NA 136 134*  K 4.2 4.2  CL 108 105  CO2 23 20*  GLUCOSE 143* 100*  BUN 16 17  CREATININE 1.39* 1.37*  CALCIUM 8.3* 8.2*    Intake/Output Summary (Last 24 hours) at 12/23/2020 0932 Last data filed at 12/22/2020 1700 Gross per 24 hour  Intake 480 ml  Output --  Net 480 ml        Physical Exam: Vital Signs Blood pressure (!) 145/90, pulse (!) 57, temperature 98.4 F (36.9 C), temperature source Oral, resp. rate 20, SpO2 100 %.   General: No acute distress Mood and affect are appropriate Heart: Regular rate and rhythm no rubs murmurs or extra sounds Lungs: Clear to auscultation, breathing unlabored, no rales or wheezes Abdomen: Positive bowel sounds, soft nontender to palpation, nondistended Extremities: No clubbing, cyanosis, or edema Skin: No evidence of breakdown, no evidence of rash   Neuro: Cranial nerves II through XII intact, motor strength is 5/5 in Left , 4/5 Right  deltoid, bicep, tricep, grip, hip flexor, knee extensors, ankle dorsiflexor and plantar flexor Sensory exam normal sensation to light touch  in bilateral upper and lower extremities Cerebellar exam normal finger to nose to finger as well as heel to shin in bilateral upper and lower extremities Musculoskeletal: Full range of motion in all 4 extremities. No joint swelling   Assessment/Plan: 1. Functional deficits which require 3+ hours per day of interdisciplinary therapy in a comprehensive inpatient rehab setting.  Physiatrist is providing close team supervision and 24 hour management of active medical problems listed  below.  Physiatrist and rehab team continue to assess barriers to discharge/monitor patient progress toward functional and medical goals  Care Tool:  Bathing    Body parts bathed by patient: Right arm,Left arm,Chest,Abdomen,Front perineal area,Buttocks,Right upper leg,Left upper leg,Right lower leg,Left lower leg,Face         Bathing assist Assist Level: Minimal Assistance - Patient > 75%     Upper Body Dressing/Undressing Upper body dressing   What is the patient wearing?: Pull over shirt    Upper body assist Assist Level: Minimal Assistance - Patient > 75%    Lower Body Dressing/Undressing Lower body dressing      What is the patient wearing?: Underwear/pull up,Incontinence brief     Lower body assist Assist for lower body dressing: Minimal Assistance - Patient > 75%     Toileting Toileting    Toileting assist Assist for toileting: Minimal Assistance - Patient > 75%     Transfers Chair/bed transfer  Transfers assist     Chair/bed transfer assist level: Minimal Assistance - Patient > 75%     Locomotion Ambulation   Ambulation assist      Assist level: Minimal Assistance - Patient > 75% Assistive device: No  Device Max distance: 15'   Walk 10 feet activity   Assist     Assist level: Minimal Assistance - Patient > 75% Assistive device: No Device   Walk 50 feet activity   Assist    Assist level: Minimal Assistance - Patient > 75% Assistive device: No Device    Walk 150 feet activity   Assist Walk 150 feet activity did not occur: Safety/medical concerns         Walk 10 feet on uneven surface  activity   Assist     Assist level: Minimal Assistance - Patient > 75% Assistive device: Hand held assist   Wheelchair     Assist Will patient use wheelchair at discharge?: No             Wheelchair 50 feet with 2 turns activity    Assist            Wheelchair 150 feet activity     Assist          Blood  pressure (!) 145/90, pulse (!) 57, temperature 98.4 F (36.9 C), temperature source Oral, resp. rate 20, SpO2 100 %.  Medical Problem List and Plan: 1.  Right side weakness with aphasia secondary to left MCA infarct due to left M1 occlusion status post stenting             -patient may  shower             -ELOS/Goals:  5-8 days, mod I with PT and OT, supervision with SLP 2.  Antithrombotics: -DVT/anticoagulation: Lovenox             -antiplatelet therapy: Brilinta 90 mg twice daily 3. Pain Management: Tylenol as needed 4. Mood: Lexapro 20 mg daily, melatonin 10 mg nightly as needed             -antipsychotic agents: N/A 5. Neuropsych: This patient is not capable of making decisions on his own behalf. 6. Skin/Wound Care: Routine skin checks 7. Fluids/Electrolytes/Nutrition: Routine in and outs with follow-up chemistries 8.  Epilepsy/seizures, Lamictal 50 mg twice daily, Depakote 1000 mg every 12 hours.  EEG negative for seizure 9.  Hyperlipidemia.  Lipitor 10.  Hypertension.  Norvasc 10 mg daily, HCTZ 25 mg daily Imdur 30 mg daily.  Monitor with increased mobility Vitals:   12/22/20 1653 12/23/20 0544  BP: (!) 143/88 (!) 145/90  Pulse: (!) 57 (!) 57  Resp: 20 20  Temp: 98.3 F (36.8 C) 98.4 F (36.9 C)  SpO2: 100% 100%  fair control monitor on current meds  11.  Diabetes mellitus.  Hemoglobin A1c 9.7.  SSI.  Patient on Lantus 40 units nightly prior to admission.  Resume as needed            CBG (last 3)  Recent Labs    12/22/20 1651 12/22/20 2033 12/23/20 0434  GLUCAP 175* 168* 99  controlled 5/25 12.  CAD with myocardial infarction.  Patient now on Brilinta 13.  History of tobacco polysubstance abuse.  Urine drug screen positive marijuana.  Provide counseling 14.  AKI on CKD admission creatinine 2.15 improved to 1.39.  Follow-up chemistries             -encourage adequate intake             -wasn't motivated to eat much while I was in room today. 15.  Questionable medical  noncompliance.  Provide counseling 16. Lethargy: check admission lab work tomorrow morning including vpa level    -  monitor sleep chart   17.  Pancytopenia - mild - will consult pharmacy to see which meds are most likely involved LOS: 2 days A FACE TO [place] 12/23/2020, 8:22 AM

## 2020-12-23 NOTE — Progress Notes (Addendum)
MEDICATION RELATED CONSULT NOTE    Pharmacy Consult : Review medications from potential causes of pancytopenia   Allergies  Allergen Reactions  . Aspirin Swelling and Other (See Comments)    Facial swelling      Labs: Recent Labs    12/21/20 0533 12/22/20 0438  WBC 2.9* 2.8*  HGB 10.1* 11.1*  HCT 28.7* 32.0*  PLT 114* 137*  CREATININE 1.39* 1.37*  ALBUMIN  --  2.2*  PROT  --  5.0*  AST  --  29  ALT  --  19  ALKPHOS  --  38  BILITOT  --  0.9     Medications:  Scheduled:  . amLODipine  10 mg Oral Daily  . atorvastatin  80 mg Oral QHS  . divalproex  1,000 mg Oral Q12H  . enoxaparin (LOVENOX) injection  40 mg Subcutaneous Q24H  . escitalopram  20 mg Oral Daily  . feeding supplement  237 mL Oral TID BM  . hydrochlorothiazide  25 mg Oral Daily  . insulin aspart  0-9 Units Subcutaneous TID WC & HS  . isosorbide mononitrate  30 mg Oral Daily  . lamoTRIgine  50 mg Oral BID  . multivitamin with minerals  1 tablet Oral Daily  . pantoprazole  40 mg Oral Daily  . ticagrelor  90 mg Oral BID   Or  . ticagrelor  90 mg Per Tube BID    Assessment:  I have reviewed Mr. Schaumburg' list of inpatient medications for possible causes for pancytopenia.  Valproic acid (divalproex) is associated with bone marrow suppression, anemia, thrombocytopenia, and leukopenia.  It is typically dose related and associated with VPA levels >100 mcg/ml.  In most cases the medication can be dose reduced and does not need to be discontinued.   Additionally, there are a few case reports of hydrochlorothiazide associated with aplastic anemia and thrombocytopenia.  However a specific causality or link is not known.    No other noted association of pancytopenia with the remaining inpatient medications on Mr. Leinberger' med list.  Recommendations: Consider a valproic acid level and if elevated reduce valproic acid dose.   [employer], Pharm.D., BCPS Clinical Pharmacist  **Pharmacist phone directory  can be found on [email or web address] listed under [email or web address].  12/23/2020 9:19 AM

## 2020-12-23 NOTE — Progress Notes (Signed)
Inpatient Rehabilitation Care Coordinator Assessment and Plan Patient Details  Name: Melvin Taylor MRN: [id number] Date of Birth: 10-30-1965  Today's Date: 12/23/2020  Hospital Problems: Active Problems:   Left middle cerebral artery stroke [facility])  Past Medical History:  Past Medical History:  Diagnosis Date  . Anxiety   . Bipolar 1 disorder ([place])   . Bone spur    left heel  . Cervical radiculopathy   . Chest pain   . Chronic back pain   . Chronic chest wall pain   . Chronic neck pain   . Coronary artery disease    a. s/p CABG in 03/2016 with LIMA-LAD, SVG-D1, SVG-RCA, and Seq SVG-mid and distal OM  . Depression   . Diabetes mellitus    Type II  . Hallucinations    "long history of them"  . Headache(784.0)   . History of gout   . HTN (hypertension)   . Insomnia   . Myocardial infarction ([place]) 2017  . Neuropathy   . Pain management   . Polysubstance abuse ([place])   . Right leg pain    chronic  . Schizophrenia ([place])   . Seizures ([place])    last sz between July 5-9th, 2016; epilepsy  . Shortness of breath dyspnea    with exertion  . Sleep apnea   . Transfusion of blood product refused for religious reason    Past Surgical History:  Past Surgical History:  Procedure Laterality Date  . ANTERIOR CERVICAL DECOMP/DISCECTOMY FUSION N/A 12/14/2015   Procedure: ANTERIOR CERVICAL DECOMPRESSION/DISCECTOMY FUSION CERVICAL FIVE -SIX;  Surgeon: Earnie Larsson, MD;  Location: [place] NEURO ORS;  Service: Neurosurgery;  Laterality: N/A;  . BIOPSY  05/07/2015   Procedure: BIOPSY (Gastric);  Surgeon: [name], MD;  Location: [facility];  Service: Endoscopy;;  . CARDIAC CATHETERIZATION N/A 03/07/2016   Procedure: Left Heart Cath and Coronary Angiography;  Surgeon: Belva Crome, MD;  Location: [place] CV LAB;  Service: Cardiovascular;  Laterality: N/A;  . COLONOSCOPY WITH PROPOFOL N/A 05/07/2015   [id number] diverticulosis, multiple colon polyps removed, tubular adenoma, serrated colon  polyp. Next colonoscopy October 2019  . COLONOSCOPY WITH PROPOFOL N/A 08/02/2018   Procedure: COLONOSCOPY WITH PROPOFOL;  Surgeon: [name], MD;  Location: [facility];  Service: Endoscopy;  Laterality: N/A;  12:00pm  . CORONARY ARTERY BYPASS GRAFT N/A 03/28/2016   Procedure: CORONARY ARTERY BYPASS GRAFTING (CABG) x 5 USING GREATER SAPHENOUS VEIN;  Surgeon: [name], MD;  Location: [place] OR;  Service: Open Heart Surgery;  Laterality: N/A;  . ENDOVEIN HARVEST OF GREATER SAPHENOUS VEIN Right 03/28/2016   Procedure: ENDOVEIN HARVEST OF GREATER SAPHENOUS VEIN;  Surgeon: [name], MD;  Location: [place];  Service: Open Heart Surgery;  Laterality: Right;  . ESOPHAGEAL DILATION N/A 05/07/2015   Procedure: ESOPHAGEAL DILATION WITH 56FR MALONEY DILATOR;  Surgeon: [name], MD;  Location: [facility];  Service: Endoscopy;  Laterality: N/A;  . ESOPHAGOGASTRODUODENOSCOPY (EGD) WITH PROPOFOL N/A 05/07/2015   RMR: Status post dilation of normal esophagus. Gastritis.  . IR CT HEAD LTD  12/17/2020  . IR CT HEAD LTD  12/17/2020  . IR PERCUTANEOUS ART THROMBECTOMY/INFUSION INTRACRANIAL INC DIAG ANGIO  12/17/2020  . KNEE SURGERY Left    arthroscopy  . MANDIBLE FRACTURE SURGERY    . POLYPECTOMY  05/07/2015   Procedure: POLYPECTOMY (Hepatic Flexure, Distal Transverse Colon, Rectal);  Surgeon: [name], MD;  Location: [facility];  Service: Endoscopy;;  . RADIOLOGY WITH  ANESTHESIA N/A 12/16/2020   Procedure: IR WITH ANESTHESIA;  Surgeon: Luanne Bras, MD;  Location: [place];  Service: Radiology;  Laterality: N/A;  . TEE WITHOUT CARDIOVERSION N/A 03/28/2016   Procedure: TRANSESOPHAGEAL ECHOCARDIOGRAM (TEE);  Surgeon: [name], MD;  Location: [place];  Service: Open Heart Surgery;  Laterality: N/A;  . TONSILLECTOMY     Social History:  reports that he has been smoking cigarettes. He has a 15.00 pack-year smoking history. He has never used smokeless tobacco. He reports current drug use. Drug:  Marijuana. He reports that he does not drink alcohol.  Family / Support Systems Marital Status: Single Other Supports: [name] (sister), Inez Catalina [phone]) Anticipated Caregiver: Criss Rosales Ability/Limitations of Caregiver: Pt has HC aide 6 days/week, 4 hours, day Caregiver Availability: 24/7 Family Dynamics: Not much family. Support from aunt and sister  Social History Preferred language: English Religion: Jehovah's Witness Read: Yes Write: Yes Public relations account executive Issues: n/a Guardian/Conservator: Wynelle Beckmann   Abuse/Neglect Abuse/Neglect Assessment Can Be Completed: Yes Physical Abuse: Denies Verbal Abuse: Denies Sexual Abuse: Denies Exploitation of patient/patient's resources: Denies Self-Neglect: Denies  Emotional Status Pt's affect, behavior and adjustment status: pt not speaking, but pleasant and use of gestures Recent Psychosocial Issues: schizophrenia, anxiety, bi polar, depression Psychiatric History: schizophrenia, anxiety, bi polar, depression Substance Abuse History: tobacco, polysubstance  Patient / Family Perceptions, Expectations & Goals Pt/Family understanding of illness & functional limitations: yes Premorbid pt/family roles/activities: only went out for appointments Anticipated changes in roles/activities/participation: family able to assist Pt/family expectations/goals: [place]: None Premorbid Home Care/DME Agencies: Other (Comment) Media planner, Radio producer, Conservation officer, nature) Transportation available at discharge: family able to [employer] referrals recommended: Neuropsychology (poly sub, schizphrenia, compliance, coping, anxiety/depression)  Discharge Planning Living Arrangements: Alone Support Systems: Other relatives (Aunt and Sister) Type of Residence: Private residence (1 level 3 steps to enter) Insurance underwriter Resources: [employer] (specify county) Pensions consultant: Passenger transport manager Screen Referred: Yes Living Expenses: Rent Does the patient have any problems obtaining your medications?: No Home Management: independent Patient/Family Preliminary Plans: aunt/aide able to assist Care Coordinator Barriers to Discharge: Medication compliance,Decreased caregiver support,Lack of/limited family support,Insurance for SNF coverage Care Coordinator Anticipated Follow Up Needs: HH/OP Expected length of stay: 5-7 Days  Clinical Impression SW met with pt on 5/24. Introduced self, explained role. Pt and family goal to d/c back home with family to assist. No questions or concerns, sw will follow up with pt family today.   Dyanne Iha 12/23/2020, 9:31 AM

## 2020-12-23 NOTE — IPOC Note (Addendum)
Overall Plan of Care [facility]) Patient Details Name: Melvin Taylor MRN: [id number] DOB: 1966/03/01  Admitting Diagnosis: Acute ischemic left MCA stroke [facility])  Hospital Problems: Principal Problem:   Acute ischemic left MCA stroke [facility]) Active Problems:   Left middle cerebral artery stroke [facility])     Functional Problem List: Nursing Bladder,Bowel,Medication Management,Safety,Edema,Endurance  PT Balance,Behavior,Endurance,Motor,Nutrition,Pain,Perception,Safety,Sensory,Skin Integrity  OT Balance,Motor,Endurance,Cognition,Safety,Perception,Vision  SLP    TR         Basic ADL's: OT Eating,Grooming,Bathing,Dressing,Toileting     Advanced  ADL's: OT Simple Meal Preparation     Transfers: PT Bed Mobility,Bed to [place]  OT Toilet,Tub/Shower     Locomotion: PT Ambulation,Stairs     Additional Impairments: OT Fuctional Use of Upper Extremity  SLP Communication,Social Cognition comprehension,expression Awareness,Problem Solving,Memory,Attention  TR      Anticipated Outcomes Item Anticipated Outcome  Self Feeding independent  Swallowing      Basic self-care  supervision  Toileting  modified independent   Bathroom Transfers supervision  Bowel/Bladder  Manage bowel and bladder with mod I assist  Transfers  spv  Locomotion  spv  Communication  Min A  Cognition  Min A  Pain  n/a  Safety/Judgment  maintain safety with cures/reminders   Therapy Plan: PT Intensity: Minimum of 1-2 x/day ,45 to 90 minutes PT Frequency: 5 out of 7 days PT Duration Estimated Length of Stay: 5-7 OT Intensity: Minimum of 1-2 x/day, 45 to 90 minutes OT Frequency: 5 out of 7 days OT Duration/Estimated Length of Stay: 5-7 days SLP Intensity: Minumum of 1-2 x/day, 30 to 90 minutes SLP Frequency: 3 to 5 out of 7 days SLP Duration/Estimated Length of Stay: 7-10 days   Due to the current state of emergency, patients may not be receiving their 3-hours of  Medicare-mandated therapy.   Team Interventions: Nursing Interventions Patient/Family Education,Bowel Management,Disease Management/Prevention,Bladder [place] Planning  PT interventions Ambulation/gait training,DME/adaptive equipment instruction,Psychosocial support,UE/LE Strength taining/ROM,Balance/vestibular training,Functional electrical stimulation,Skin care/wound management,UE/LE Coordination activities,Cognitive remediation/compensation,Functional mobility training,Splinting/orthotics,Visual/perceptual remediation/compensation,Community reintegration,Neuromuscular re-education,Stair training,Wheelchair propulsion/positioning,Discharge planning,Pain management,Therapeutic Activities,Disease management/prevention,Patient/family education,Therapeutic Exercise  OT Interventions Balance/vestibular training,Discharge planning,Self Care/advanced ADL retraining,Therapeutic Activities,UE/LE Coordination activities,Functional mobility training,Patient/family education,Therapeutic Exercise,Community reintegration,DME/adaptive equipment instruction,Neuromuscular re-education,UE/LE Strength taining/ROM,Cognitive remediation/compensation,Disease mangement/prevention,Pain management,Visual/perceptual remediation/compensation,Psychosocial support  SLP Interventions Cognitive remediation/compensation,Cueing hierarchy,Environmental controls,Internal/external aids,Functional tasks,Patient/family education,Speech/Language facilitation  TR Interventions    SW/CM Interventions Discharge Planning,Psychosocial Support,Patient/Family Education,Disease Management/Prevention   Barriers to Discharge MD  Medical stability and Lack of/limited family support  Nursing Home environment access/layout,Decreased caregiver support solo with PCS 4 hr/day, 6 days/wk, 1 level apt with 3 ste and right rail  PT Decreased caregiver support,Lack of/limited family support,Insurance for SNF coverage     OT Decreased caregiver support will need 24 hour supervision for safety  SLP      SW Medication compliance,Decreased caregiver support,Lack of/limited family support,Insurance for SNF coverage     Team Discharge Planning: Destination: PT-Home ,OT- Home , SLP-Home Projected Follow-up: PT-Home health PT,24 hour supervision/assistance, OT-  24 hour supervision/assistance,Home health OT, SLP-Home Health SLP,Outpatient SLP,24 hour supervision/assistance Projected Equipment Needs: PT-To be determined, OT- To be determined, SLP-None recommended by SLP Equipment Details: PT- , OT-  Patient/family involved in discharge planning: PT- Patient,  OT-Patient, SLP-Patient  MD ELOS: 7-10d Medical Rehab Prognosis:  Excellent Assessment:  55 year old right-handed male with history of hypertension hyperlipidemia, CAD with myocardial infarction maintained on Plavix, epilepsy/seizures maintained on Depakote as well as Lamictal, schizophrenia, diabetes mellitus, tobacco abuse as well as polysubstance abuse questionable medical compliance. Per chart review patient lives alone. 1  level home. Has a PCA 4 hours a day 6 days a week. Independent with assistive device. He has a sister in the area. Presented 12/16/2020 with acute onset of right-sided weakness and speech difficulties. CT/MRI showed scattered acute infarcts within the left MCA and MCA/PCA watershed territories affecting the cortical/subcortical left frontal parietal and occipital lobe. Multiple acute infarcts also present within the left insula and basal ganglia. No significant mass-effect. No hemorrhagic conversion. MRA demonstrated moderate stenosis within the proximal M1 and right MCA. Severe stenosis within the P2 and right PCA. Patient underwent mechanical thrombectomy per interventional radiology. Echocardiogram with ejection fraction of 65 to 16% grade 1 diastolic dysfunction. EEG negative for seizure. Admission chemistries glucose 175,  creatinine 2.15, hemoglobin 12, hemoglobin A1c 9.7, urine drug screen was positive for marijuana. Currently maintained on Brilinta for CVA prophylaxis, no aspirin secondary to questionable allergy. Maintained on Lovenox for DVT prophylaxis. Therapy evaluations completed due to patient's right side weakness and speech difficulties was admitted for a comprehensive rehab program.  Now requiring 24/7 Rehab RN,MD, as well as CIR level PT, OT and SLP.  Treatment team will focus on ADLs and mobility with goals set at Sup/Mod I  See Team Conference Notes for weekly updates to the plan of care

## 2020-12-23 NOTE — Progress Notes (Signed)
Patient ID: Melvin Taylor, male   DOB: 1966/07/01, 54 y.o.   MRN: [id number] Team Conference Report to Patient/Family  Team Conference discussion was reviewed with the patient and caregiver, including goals, any changes in plan of care and target discharge date.  Patient and caregiver express understanding and are in agreement.  The patient has a target discharge date of 12/30/20.  SW met with pt and called sister at bedside. Provided conference updates. Pt will return home with sister, aunt and hired aid assistance. Sister to increase pt aide time to 5x a week 8 hrs a day. Sister have some questions about pt having accidents. No other questions or concerns, sw will cont to follow up Dyanne Iha 12/23/2020, 1:16 PM

## 2020-12-23 NOTE — Progress Notes (Signed)
Patient ID: Melvin Taylor, male   DOB: 10-12-1965, 55 y.o.   MRN: [id number]  PT [facility] referral sent to [facility]. SW waiting for follow up  Erlene Quan, [place]

## 2020-12-24 LAB — GLUCOSE, CAPILLARY
Glucose-Capillary: 114 mg/dL — ABNORMAL HIGH (ref 70–99)
Glucose-Capillary: 165 mg/dL — ABNORMAL HIGH (ref 70–99)
Glucose-Capillary: 92 mg/dL (ref 70–99)
Glucose-Capillary: 177 mg/dL — ABNORMAL HIGH (ref 70–99)

## 2020-12-24 NOTE — Progress Notes (Signed)
Speech Language Pathology Daily Session Note  Patient Details  Name: Melvin Taylor MRN: [id number] Date of Birth: Jul 03, 1966  Today's Date: 12/24/2020 SLP Individual Time: 2111-5520 SLP Individual Time Calculation (min): 30 min  Short Term Goals: Week 1: SLP Short Term Goal 1 (Week 1): STG=LTG due to short ELOS  Skilled Therapeutic Interventions: Skilled SLP intervention focused on expressive language. Pt responded to questions appropriately in conversation with short sentences and was able to expand sentences with moderate verbal cues. He communicated wants/needs and thoughts appropriately with minimal word finding difficulty.  Confrontational naming task completed with 80% accuracy. Errors were semantic paraphasias (comb for brush and fork for knife). Pt wrote simple 4-5 letter words with min A visual cues for errors in spelling. Pt left seated upright in chair with chair alarm set and call button within reach.      Pain Pain Assessment Pain Scale: Faces Faces Pain Scale: No hurt  Therapy/Group: Individual Therapy  Darrol Poke Gabryelle Whitmoyer 12/24/2020, 2:59 PM

## 2020-12-24 NOTE — Progress Notes (Signed)
Physical Therapy Session Note  Patient Details  Name: Melvin Taylor MRN: [id number] Date of Birth: 12-Jan-1966  Today's Date: 12/24/2020 PT Individual Time: 1100-1157 PT Individual Time Calculation (min): 57 min   Short Term Goals: Week 1:  PT Short Term Goal 1 (Week 1): STG= LTG based on ELOS  Skilled Therapeutic Interventions/Progress Updates:    Patient received sitting up in recliner, agreeable to PT. He denies pain. Patient able to ambulate to therapy gym with no AD and CGA. Standing dual task completed following Peg board picture to match pattern. CGA provided for balance and Max verbal cues for accuracy completing simple peg board pattern. Even with max cues, patient taking ~63mins to complete simple pattern. Intermittent weight shift R/slight LOB R with limited ability to shift weight back to L. Patient completing dynamic standing balance task on solid ground using 2# dowel chest press 2x12, shoulder press 2x12. Patient with equal UE ROM noted and intermittent LOB R. Dynamic standing balance progressed to on foam with mirror for visual feedback on posture. He was able to recognize LOB R, but again with limited ability to return to midline/weight shift L. Patient ambulating back to room with no AD and CGA with no cuing for path finding. Up in recliner, seatbelt alarm on, call light within reach.   Therapy Documentation Precautions:  Precautions Precautions: Fall Precaution Comments: R side inattention Restrictions Weight Bearing Restrictions: No    Therapy/Group: Individual Therapy  Karoline Caldwell, PT, DPT, CBIS  12/24/2020, 8:57 AM

## 2020-12-24 NOTE — Progress Notes (Signed)
Physical Therapy Session Note  Patient Details  Name: Melvin Taylor MRN: [id number] Date of Birth: Apr 11, 1966  Today's Date: 12/24/2020 PT Individual Time: 5391-2258 PT Individual Time Calculation (min): 55 min   Short Term Goals: Week 1:  PT Short Term Goal 1 (Week 1): STG= LTG based on ELOS  Skilled Therapeutic Interventions/Progress Updates:  Pt received sitting in recliner and agreeable to PT. Pt performed stand pivot transfer to WC. Pt transported to entrance of [place]. Gait training over cement sidewalk with CGA for safety with clothing management 241f, [id number] for step height on the R to prevent foot drag. Pt performed scavenger hunt through gift shop with CGA for safety with ambulation. Pt able to recall 2 of 3 objects, but unable to identify without max cues once in store. Partial incontinent episode, able to void bladder at urinal in atrium. Patient returned to room and performed lower body dressin change with set up assist from PT. Pt then left sitting in recliner with call bell in reach and all needs met.         Therapy Documentation Precautions:  Precautions Precautions: Fall Precaution Comments: R side inattention Restrictions Weight Bearing Restrictions: No   Pain: Pain Assessment Pain Scale: Faces Faces Pain Scale: No hurt    Therapy/Group: Individual Therapy  AusLorie Phenix26/2022, 5:31 PM

## 2020-12-24 NOTE — Progress Notes (Signed)
Occupational Therapy Session Note  Patient Details  Name: Melvin Taylor MRN: [id number] Date of Birth: [birth year]  Today's Date: 12/24/2020 OT Individual Time: 3151-7616 OT Individual Time Calculation (min): 53 min    Short Term Goals: Week 1:  OT Short Term Goal 1 (Week 1): STGs equal to LTGs set at supervision to modified independent level.  Skilled Therapeutic Interventions/Progress Updates:    Pt received semi-reclined in bed, denies pain, agreeable to therapy. Came sitting EOB with close S. Doffed/donned B socks close S, total A from therapist to don B ted hose. Completed UBD close S seated EOB, LBD with CGA for STS. Stand-pivot with no AD throughout session with CGA. W/c transport to and from gym 2/2 time management. Session focus on RUE NMR/FMC, attention to task, R visual scanning, and functional cognition in prep for ADL/IADL performance. Assessed B grip strength and administered 9HPT with the following results:  R: 30, 26, 24; average of 27 lbs; 2 min 15 secs L: 45, 38, 39; average of 41 lbs; 52 secs  Next, pt practiced flipping over stack of cards with R hand - able to complete with increased time, although req mod VCs to not use L hand. Additionally, completed 1 line of letter cancellation task. Able to ind identify targets on L side/middle of paper, but req direct cuing to identify 3 targets on his R. Req significant amount of time to complete. Using his dominant R hand, wrote his first name with increased time. Reports that it is too difficult to write his last name and that his handwriting is at 50% accuracy compared to handwriting prior to stroke. Edu re: R visual scanning, R [place] activities to complete between therapies.   Pt left seated in recliner with safety belt alarm engaged, call bell in reach, and all immediate needs met.    Therapy Documentation Precautions:  Precautions Precautions: Fall Precaution Comments: R side inattention Restrictions Weight Bearing  Restrictions: No Pain: Pain Assessment Pain Scale: 0-10 Pain Score: 0-No pain Faces Pain Scale: No hurt ADL: See Care Tool for more details.  Therapy/Group: Individual Therapy  Volanda Napoleon MS, OTR/L  12/24/2020, 6:50 AM

## 2020-12-25 DIAGNOSIS — F209 Schizophrenia, unspecified: Secondary | ICD-10-CM

## 2020-12-25 LAB — GLUCOSE, CAPILLARY
Glucose-Capillary: 89 mg/dL (ref 70–99)
Glucose-Capillary: 188 mg/dL — ABNORMAL HIGH (ref 70–99)
Glucose-Capillary: 188 mg/dL — ABNORMAL HIGH (ref 70–99)
Glucose-Capillary: 210 mg/dL — ABNORMAL HIGH (ref 70–99)

## 2020-12-25 NOTE — Progress Notes (Signed)
Physical Therapy Session Note  Patient Details  Name: Melvin Taylor MRN: [id number] Date of Birth: 07-Sep-1965  Today's Date: 12/25/2020 PT Individual Time: 1445-1530 PT Individual Time Calculation (min): 45 min   Short Term Goals: Week 1:  PT Short Term Goal 1 (Week 1): STG= LTG based on ELOS  Skilled Therapeutic Interventions/Progress Updates:    Patient received sitting up in recliner, agreeable to PT. He denies pain. Patient able to ambulate ~140ft to therapy gym with CGA and no AD. Completed floor transfer with verbal cues and CGA progressing to supervision. PT educating patient on fall safety including when to call 911/MD vs have family assist him up. Due to patients poor memory, family will need to be educated on the above to reinforce with patient in the event that he does fall at home. Patient completing dynamic standing balance on mat/uneven surface with 3# dowel chest press/shoulder press. Patient initially with very good balance, but with fatigue would develop R lateral lean. He would note awareness of this lean, but would not be able to correct it until he lost his balance and then he demonstrated appropriate stepping strategies to recover. No ankle or hip strategy noted. Patient ambulating additional 164ft to dayroom with CGA. Completing x6 mins on K[place] with B LE at 40cm/s. Patient ambulating back to his room with CGA and verbal cues for path finding. Returning to recliner, seatbelt alarm on call light within reach.   Therapy Documentation Precautions:  Precautions Precautions: Fall Precaution Comments: R side inattention Restrictions Weight Bearing Restrictions: No    Therapy/Group: Individual Therapy  Karoline Caldwell, PT, DPT, CBIS  12/25/2020, 7:39 AM

## 2020-12-25 NOTE — Progress Notes (Signed)
Occupational Therapy Session Note  Patient Details  Name: Melvin Taylor MRN: [id number] Date of Birth: June 16, 1966  Today's Date: 12/25/2020 OT Individual Time: 0813-0901 OT Individual Time Calculation (min): 48 min    Short Term Goals: Week 1:  OT Short Term Goal 1 (Week 1): STGs equal to LTGs set at supervision to modified independent level.  Skilled Therapeutic Interventions/Progress Updates:    Session 1: ([id number]) Pt received seated in w/c, agreeable to OT session, declining ADLs, and no pain reported. Pt self-propelled w/c ~20' with min A and vc's for hand placement on wheels; demo'd difficulty maintaining straight pushes and often turned into the R wall secondary to decreased coordination in the RUE. Pt educated and engaged in tub shower transfers 1) stepping over tub laterally while holding onto wall with BUEs and 2) with TTB using grab bars and no AD to simulate home environment. Mod vc's required for safety when stepping over threshold and in sit>stands. All tub transfers and sit<>stands completed CGA with 1 LOB requiring min A. Pt demo'd good understanding of DME options for home (has tub-shower with doors) and discussed need for possible removal of doors to accommodate for preferred TTB. Pt engaged in dynamic standing (CGA with no LOB), visual scanning, and cognitive sequencing task using BITS to correctly scan/sequence alphabet. Mod-max vc's required for L>R scanning and anchoring technique, and to correctly sequence alphabet at times. Pt completed A-L in 9:44 min with 48.33 sec reaction time and demo'd delayed processing and visual scanning abilities, especially to the R quadrants. Pt left seated in w/c, alarm set, all needs met, and call bell in reach.  Session 2: ([id number])  Pt in recliner to start session with functional mobility to the dayroom with min assist and no assistive device.  Worked on RUE coordination and visual scanning to the right to replicate a peg design task  following a given diagram.  He needed mod assist to complete simple 12 peg design to follow pattern.  Max demonstrational cueing to recognize errors.  Task took over 10 mins before therapist had to intervene to help him complete.  Finished with work on [employer] coordination task of removing small bolts from board and placing them back with the RUE.  Increased time needed to complete secondary to decreased coordination.  Finished session with return to the room at min assist level ambulating with pt left sitting up in the recliner with the call button and phone in reach.    Therapy Documentation Precautions:  Precautions Precautions: Fall Precaution Comments: R side inattention Restrictions Weight Bearing Restrictions: No Pain: Pain Assessment Pain Scale: 0-10 Pain Score: 0-No pain   Therapy/Group: Individual Therapy  Mellissa Kohut 12/25/2020, 12:33 PM

## 2020-12-25 NOTE — Progress Notes (Signed)
PROGRESS NOTE   Subjective/Complaints: Patient's chart reviewed- No issues reported overnight Vitals signs stable CBGs 89-188 Ambulating >700 feet  ROS- denies CP, SOB, N/V/D   Objective:   No results found. No results for input(s): WBC, HGB, HCT, PLT in the last 72 hours. No results for input(s): NA, K, CL, CO2, GLUCOSE, BUN, CREATININE, CALCIUM in the last 72 hours.  Intake/Output Summary (Last 24 hours) at 12/25/2020 1505 Last data filed at 12/25/2020 1215 Gross per 24 hour  Intake 417 ml  Output 750 ml  Net -333 ml        Physical Exam: Vital Signs Blood pressure 113/69, pulse 67, temperature 97.6 F (36.4 C), resp. rate 17, SpO2 100 %. Gen: no distress, normal appearing HEENT: oral mucosa pink and moist, NCAT Cardio: Reg rate Chest: normal effort, normal rate of breathing Abd: soft, non-distended Ext: no edema Psych: pleasant, normal affect Skin: intact  Neuro: Cranial nerves II through XII intact, motor strength is 5/5 in Left , 4/5 Right  deltoid, bicep, tricep, grip, hip flexor, knee extensors, ankle dorsiflexor and plantar flexor Sensory exam normal sensation to light touch  in bilateral upper and lower extremities Cerebellar exam normal finger to nose to finger as well as heel to shin in bilateral upper and lower extremities Musculoskeletal: Full range of motion in all 4 extremities. No joint swelling   Assessment/Plan: 1. Functional deficits which require 3+ hours per day of interdisciplinary therapy in a comprehensive inpatient rehab setting.  Physiatrist is providing close team supervision and 24 hour management of active medical problems listed below.  Physiatrist and rehab team continue to assess barriers to discharge/monitor patient progress toward functional and medical goals  Care Tool:  Bathing    Body parts bathed by patient: Right arm,Left arm,Chest,Abdomen,Front perineal  area,Buttocks,Right upper leg,Left upper leg,Right lower leg,Left lower leg,Face         Bathing assist Assist Level: Contact Guard/Touching assist     Upper Body Dressing/Undressing Upper body dressing   What is the patient wearing?: Pull over shirt    Upper body assist Assist Level: Supervision/Verbal cueing    Lower Body Dressing/Undressing Lower body dressing      What is the patient wearing?: Pants     Lower body assist Assist for lower body dressing: Contact Guard/Touching assist     Toileting Toileting    Toileting assist Assist for toileting: Minimal Assistance - Patient > 75%     Transfers Chair/bed transfer  Transfers assist     Chair/bed transfer assist level: Contact Guard/Touching assist     Locomotion Ambulation   Ambulation assist      Assist level: Contact Guard/Touching assist Assistive device: No Device Max distance: 150   Walk 10 feet activity   Assist     Assist level: Contact Guard/Touching assist Assistive device: No Device   Walk 50 feet activity   Assist    Assist level: Contact Guard/Touching assist Assistive device: No Device    Walk 150 feet activity   Assist Walk 150 feet activity did not occur: Safety/medical concerns  Assist level: Contact Guard/Touching assist Assistive device: No Device    Walk 10 feet on uneven surface  activity   Assist     Assist level: Minimal Assistance - Patient > 75% Assistive device: Hand held assist   Wheelchair     Assist Will patient use wheelchair at discharge?: No             Wheelchair 50 feet with 2 turns activity    Assist            Wheelchair 150 feet activity     Assist          Blood pressure 113/69, pulse 67, temperature 97.6 F (36.4 C), resp. rate 17, SpO2 100 %.  Medical Problem List and Plan: 1.  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  Antithrombotics: -DVT/anticoagulation: Lovenox             -antiplatelet therapy: Brilinta 90 mg twice daily 3. Pain Management: Continue Tylenol as needed 4. Mood: Continue Lexapro 20 mg daily, melatonin 10 mg nightly as needed             -antipsychotic agents: N/A 5. Neuropsych: This patient is not capable of making decisions on his own behalf. 6. Skin/Wound Care: Routine skin checks 7. Fluids/Electrolytes/Nutrition: Routine in and outs with follow-up chemistries 8.  Epilepsy/seizures, Lamictal 50 mg twice daily, Depakote 1000 mg every 12 hours.  EEG negative for seizure 9.  Hyperlipidemia.  Lipitor 10.  Hypertension.  Continue Norvasc 10 mg daily, HCTZ 25 mg daily Imdur 30 mg daily.  Monitor with increased mobility Vitals:   12/25/20 0509 12/25/20 1444  BP: (!) 155/77 113/69  Pulse: (!) 57 67  Resp: 16 17  Temp: 98.4 F (36.9 C) 97.6 F (36.4 C)  SpO2: 100% 100%  Labile 5/27 11.  Diabetes mellitus.  Hemoglobin A1c 9.7.  SSI.  Patient on Lantus 40 units nightly prior to admission.  Resume as needed            CBG (last 3)  Recent Labs    12/24/20 2049 12/25/20 0557 12/25/20 1112  GLUCAP 177* 89 188*  controlled 5/25 12.  CAD with myocardial infarction.  Patient now on Brilinta 13.  History of tobacco polysubstance abuse.  Urine drug screen positive marijuana.  Provide counseling 14.  AKI on CKD admission creatinine 2.15 improved to 1.39.  Follow-up chemistries             -encourage adequate intake             -wasn't motivated to eat much while I was in room today. 15.  Questionable medical noncompliance.  Provide counseling 16. Lethargy: check admission lab work tomorrow morning including vpa level    -monitor sleep chart   17.  Pancytopenia - mild - will consult pharmacy to see which meds are most likely involved LOS: 4 days A FACE TO FACE  EVALUATION WAS [place] 12/25/2020, 3:05 PM

## 2020-12-25 NOTE — Progress Notes (Signed)
Speech Language Pathology Daily Session Note  Patient Details  Name: Melvin Taylor MRN: [id number] Date of Birth: November 22, 1965  Today's Date: 12/25/2020 SLP Individual Time: 1100-1155 SLP Individual Time Calculation (min): 55 min  Short Term Goals: Week 1: SLP Short Term Goal 1 (Week 1): STG=LTG due to short ELOS  Skilled Therapeutic Interventions:   Patient seen for skilled ST session focusing on expressive language goals. He was sitting in recliner, napping but willing to participate in session. He was able to name verb/action photos with 70% accuracy and named object pictures with 80% accuracy and minA for phonemic, semantic cues. He demonstrated awareness to errors when telling SLP year of his birth. SLP reviewed calendar with patient as he could not recall/could not state how long he has been in rehab, but he was aware of discharge next week. Patient continues to benefit from skilled SLP intervention to maximize cognitive-linguistic function prior to discharge.  Pain Pain Assessment Pain Scale: 0-10 Pain Score: 0-No pain  Therapy/Group: Individual Therapy  Sonia Baller, MA, CCC-SLP Speech Therapy

## 2020-12-25 NOTE — Consult Note (Signed)
Neuropsychological Consultation   Patient:   Melvin Taylor   DOB:   1965/12/29  MR Number:  [id number]  Location:  [facility] [id number] [place] [id number] Dept: [place]: [phone]           Date of Service:   12/25/2020  Start Time:   9 AM End Time:   10 AM  Provider/Observer:  Ilean Skill, Psy.D.       Clinical Neuropsychologist       Billing Code/Service: [phone]  Chief Complaint:    Melvin Taylor is a 55 year old male who with a past medical history including hypertension, hyperlipidemia, CAD with myocardial infarction, epilepsy/seizures maintained on Depakote as well as Lamictal, past psychiatric history including diagnoses of schizophrenia and bipolar disorder, diabetes, tobacco abuse as well as polysubstance abuse with questionable medical compliance.  Patient presented on 12/16/2020 with acute onset of right-sided weakness and speech difficulties.  CT/MRI showed scattered acute infarcts in the left MCA and MCA/PCA watershed territories affecting the cortical and subcortical left frontal parietal and occipital lobe.  Multiple acute infarcts also present within the left insula and basal ganglia.  Patient was negative for seizures.  Patient has continued to have right-sided weakness and speech difficulties but speech has been improving.  Patient referred to the comprehensive inpatient rehabilitation unit due to functional decline.  Reason for Service:  The patient was referred for neuropsychological consultation due to residual effects of his left hemispheric watershed stroke event in the setting of a history of previous diagnosis of seizure disorder, schizophrenia/bipolar disorder.  Below is the HPI for the current admission.  Melvin Taylor is a 55 year old right-handed male with history of hypertension hyperlipidemia, CAD with myocardial infarction maintained on  Plavix, epilepsy/seizures maintained on Depakote as well as Lamictal, schizophrenia, diabetes mellitus, tobacco abuse as well as polysubstance abuse questionable medical compliance. Per chart review patient lives alone. 1 level home. Has a PCA 4 hours a day 6 days a week. Independent with assistive device. He has a sister in the area. Presented 12/16/2020 with acute onset of right-sided weakness and speech difficulties. CT/MRI showed scattered acute infarcts within the left MCA and MCA/PCA watershed territories affecting the cortical/subcortical left frontal parietal and occipital lobe. Multiple acute infarcts also present within the left insula and basal ganglia. No significant mass-effect. No hemorrhagic conversion. MRA demonstrated moderate stenosis within the proximal M1 and right MCA. Severe stenosis within the P2 and right PCA. Patient underwent mechanical thrombectomy per interventional radiology. Echocardiogram with ejection fraction of 65 to 27% grade 1 diastolic dysfunction. EEG negative for seizure. Admission chemistries glucose 175, creatinine 2.15, hemoglobin 12, hemoglobin A1c 9.7, urine drug screen was positive for marijuana. Currently maintained on Brilinta for CVA prophylaxis, no aspirin secondary to questionable allergy. Maintained on Lovenox for DVT prophylaxis. Therapy evaluations completed due to patient's right side weakness and speech difficulties was admitted for a comprehensive rehab program.  Current Status:  The patient was sitting in his wheelchair awake and alert as I entered the room.  Patient displayed a somewhat bright affect and was well oriented.  He was unable to give much detail about his stroke event but was aware that he had had a stroke.  The patient reports that he has not had any exacerbation of his underlying psychiatric illness but also did not clarify much whether he was actually considered schizophrenia or bipolar disorder but he also had a long  history of polysubstance abuse.  The patient does report that his mood is stable and his cognition is clear short of the limitations and changes poststroke.  The patient acknowledges some changes in vision and visual processing as well as some increased difficulties in word finding and expressive language.  Patient notes motor changes on his left side but is able to fully participate in therapies.  Patient knows he will be going home soon and is motivated to continue to work on improvements going forward.  Behavioral Observation: Melvin Taylor  presents as a 55 y.o.-year-old Right African American Male who appeared his stated age. his dress was Appropriate and he was Well Groomed and his manners were Appropriate to the situation.  his participation was indicative of Appropriate and Redirectable behaviors.  There were physical disabilities noted.  he displayed an appropriate level of cooperation and motivation.     Interactions:    Active Appropriate and Redirectable  Attention:   abnormal and attention span appeared shorter than expected for age  Memory:   abnormal; remote memory intact, recent memory impaired  Visuo-spatial:  abnormal  Speech (Volume):  low  Speech:   normal; some word finding difficulties noted  Thought Process:  Coherent and Relevant  Though Content:  WNL; not suicidal and not homicidal  Orientation:   person, place and time/date  Judgment:   Fair  Planning:   Poor  Affect:    Flat and Lethargic  Mood:    Dysphoric  Insight:   Fair  Intelligence:   normal  Medical History:   Past Medical History:  Diagnosis Date  . Anxiety   . Bipolar 1 disorder ([place])   . Bone spur    left heel  . Cervical radiculopathy   . Chest pain   . Chronic back pain   . Chronic chest wall pain   . Chronic neck pain   . Coronary artery disease    a. s/p CABG in 03/2016 with LIMA-LAD, SVG-D1, SVG-RCA, and Seq SVG-mid and distal OM  . Depression   . Diabetes mellitus     Type II  . Hallucinations    "long history of them"  . Headache(784.0)   . History of gout   . HTN (hypertension)   . Insomnia   . Myocardial infarction ([place]) 2017  . Neuropathy   . Pain management   . Polysubstance abuse ([place])   . Right leg pain    chronic  . Schizophrenia ([place])   . Seizures ([place])    last sz between July 5-9th, 2016; epilepsy  . Shortness of breath dyspnea    with exertion  . Sleep apnea   . Transfusion of blood product refused for religious reason          Patient Active Problem List   Diagnosis Date Noted  . Left middle cerebral artery stroke ([place]) 12/21/2020  . Malnutrition of moderate degree 12/18/2020  . Middle cerebral artery embolism, 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  . Encounter for intubation   . Renal insufficiency   . Malignant hypertension   . Acute ischemic left MCA stroke ([place]) 12/16/2020  . Current smoker 06/21/2019  . Seizure-like activity ([place]) 05/06/2019  . Diabetes mellitus ([place]) 04/11/2019  . Anemia, chronic disease 07/04/2018  . Other specified diseases of the digestive system 10/30/2017  . CAD (coronary artery disease) 03/28/2016  . Chest pain 03/21/2016  . Mixed hyperlipidemia 03/11/2016  . Ischemic chest pain ([place])   . NSTEMI (non-ST elevated myocardial infarction) ([place])   .  Coronary atherosclerosis of native coronary artery 03/03/2016  . Cervical spinal stenosis 12/14/2015  . Loss of weight 08/25/2015  . Essential hypertension, benign 07/23/2015  . Personal history of noncompliance with medical treatment, presenting hazards to health 07/23/2015  . Abdominal pain 06/08/2015  . Constipation 06/08/2015  . History of colonic polyps   . Diverticulosis of colon without hemorrhage   . Mucosal abnormality of stomach   . Encounter for screening colonoscopy 04/16/2015  . Dysphagia 04/16/2015  . Partial symptomatic epilepsy with complex partial seizures, intractable, without status epilepticus ([place]) 11/10/2014  . Bipolar disorder, unspecified  ([place]) 10/15/2014  . Schizophrenia ([place]) 10/15/2014  . DM type 2 causing vascular disease ([place]) 06/29/2014  . Musculoskeletal pain 06/29/2014  . Hyperglycemia without ketosis 09/07/2013  . 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 Other internal derangements of unspecified knee 01/13/2011  . Knee pain 12/28/2010  . Medial meniscus, posterior horn derangement 12/28/2010   Psychiatric History:  Patient with prior psychiatric diagnoses including previous diagnosis of schizophrenia without specific description of variant dating in his EMR back to 2016.  Patient also has a diagnosis in his past medical history including bipolar 1 disorder as well as anxiety and depression.  On top of the psychiatric diagnoses in the past the patient also has a history of polysubstance abuse.  Patient denies current extensive substance abuse but did test positive for marijuana upon admission.  Family Med/Psych History:  Family History  Problem Relation Age of Onset  . Diabetes Father   . Hypertension Father   . Arthritis Other   . Diabetes Other   . Asthma Other   . Stroke Sister     Impression/DX:  Melvin Taylor is a 55 year old male who with a past medical history including hypertension, hyperlipidemia, CAD with myocardial infarction, epilepsy/seizures maintained on Depakote as well as Lamictal, past psychiatric history including diagnoses of schizophrenia and bipolar disorder, diabetes, tobacco abuse as well as polysubstance abuse with questionable medical compliance.  Patient presented on 12/16/2020 with acute onset of right-sided weakness and speech difficulties.  CT/MRI showed scattered acute infarcts in the left MCA and MCA/PCA watershed territories affecting the cortical and subcortical left frontal parietal and occipital lobe.  Multiple acute infarcts also present within the left insula and basal ganglia.  Patient was negative for seizures.  Patient has continued to have right-sided weakness  and speech difficulties but speech has been improving.  Patient referred to the comprehensive inpatient rehabilitation unit due to functional decline.  The patient was sitting in his wheelchair awake and alert as I entered the room.  Patient displayed a somewhat bright affect and was well oriented.  He was unable to give much detail about his stroke event but was aware that he had had a stroke.  The patient reports that he has not had any exacerbation of his underlying psychiatric illness but also did not clarify much whether he was actually considered schizophrenia or bipolar disorder but he also had a long history of polysubstance abuse.  The patient does report that his mood is stable and his cognition is clear short of the limitations and changes poststroke.  The patient acknowledges some changes in vision and visual processing as well as some increased difficulties in word finding and expressive language.  Patient notes motor changes on his left side but is able to fully participate in therapies.  Patient knows he will be going home soon and is motivated to continue to work on improvements going forward.  Disposition/Plan:  The patient appears very stable from a psychiatric standpoint and denies any exacerbation of his underlying psychiatric disorder.  He was clear and oriented and appears appropriate from a psychological/psychiatric standpoint for discharge.  Diagnosis:    Left middle cerebral artery stroke [facility]) - Plan: Ambulatory referral to Neurology, Ambulatory referral to Physical Medicine Rehab         Electronically Signed   _______________________ Ilean Skill, Psy.D. Clinical Neuropsychologist

## 2020-12-26 DIAGNOSIS — I10 Essential (primary) hypertension: Secondary | ICD-10-CM

## 2020-12-26 LAB — GLUCOSE, CAPILLARY
Glucose-Capillary: 126 mg/dL — ABNORMAL HIGH (ref 70–99)
Glucose-Capillary: 131 mg/dL — ABNORMAL HIGH (ref 70–99)
Glucose-Capillary: 239 mg/dL — ABNORMAL HIGH (ref 70–99)
Glucose-Capillary: 218 mg/dL — ABNORMAL HIGH (ref 70–99)

## 2020-12-26 MED ORDER — INSULIN GLARGINE 100 UNIT/ML ~~LOC~~ SOLN
8.0000 [IU] | Freq: Every day | SUBCUTANEOUS | Status: DC
  Administered 2020-12-26 – 2020-12-27 (×2): 8 [IU] via SUBCUTANEOUS
  Filled 2020-12-26 (×3): qty 0.08

## 2020-12-26 NOTE — Progress Notes (Signed)
Speech Language Pathology Daily Session Note  Patient Details  Name: Melvin Taylor MRN: [id number] Date of Birth: 14-Oct-1965  Today's Date: 12/26/2020 SLP Individual Time: 1450-1530 SLP Individual Time Calculation (min): 40 min  Short Term Goals: Week 1: SLP Short Term Goal 1 (Week 1): STG=LTG due to short ELOS  Skilled Therapeutic Interventions: Pt seen for skilled ST with focus on expressive language goals. Pt sitting in recliner and agreeable to therapeutic tasks. Pt reports a big change in his "attitude" following CVA, unable to fully explain but agrees with feelings of depression. Pt is currently followed by neuropsych here in [facility]. With extra time, pt able to name common objects 90% accurate independently, phonemic cues increased to 100%. Attempting divergent naming task, pt naming 1-3 items in basic categories with max cues. Pt would at times provided unrelated associations to words (I.e. category was animals, pt naming "cow", then stating "horns", then stating "[place]"). Pt with delayed responses during all tasks, benefiting from repetition of prompts. Pt left in recliner with alarm set and all needs within reach. Cont ST POC.   Pain Pain Assessment Pain Scale: 0-10 Pain Score: 0-No pain  Therapy/Group: Individual Therapy  Dewaine Conger 12/26/2020, 3:14 PM

## 2020-12-26 NOTE — Progress Notes (Signed)
Physical Therapy Session Note  Patient Details  Name: Melvin Taylor MRN: [id number] Date of Birth: 01-02-1966  Today's Date: 12/26/2020 PT Individual Time: 1105-1205 and [id number] PT Individual Time Calculation (min): 60 min and 45 min  Short Term Goals: Week 1:  PT Short Term Goal 1 (Week 1): STG= LTG based on ELOS  Skilled Therapeutic Interventions/Progress Updates:    Session 1: Pt received L sidelying in bed asleep, upon awakening agreeable to therapy session. Pt reports he doesn't remember "anything." Pt oriented to location "[place]" and to birth year but not oriented to situation, day, month, or year - therapist reoriented patient to these and utilized the calendar provided by SLP to reinforce; however, pt demos impaired short term recall of this information in a 5 minute time span. Sit<>stands, no AD, with close supervision/CGA for steadying/safety during session as pt demos overall increased postural sway in all directions. Gait training ~126ft, no AD, to main therapy gym with CGA/min assist for balance due to continued increased postural sway - upon initiating gait in room pt with extremely excessive R LE hip external rotation and R knee snapping back into hyperextension during stance, this improves with longer distance gait but pt continues to have some excessive B LE hip ER. Upon arrival to gym pt reports "feeling funny" but unable to describe symptoms further. Assisted pt into supine on mat and assessed vitals as described. Supine vitals: BP 145/91 (MAP 106), HR 52bpm Seated vitals: BP 132/87 (MAP 102), HR 59bpm  Standing: BP 109/86 (MAP 94), HR 64bpm continues to report feeling "funny" but states it's not more intense in standing than in sitting. Pt wearing B LE knee high TED hose. Symptoms improved throughout session.   Throughout session pt demos significantly delayed cognitive processing along with impaired ability to follow commands, poor awareness, and impaired  dual-task.  Therapy session focused on dynamic gait training, dynamic standing balance, R attention, dual-task training, and RUE and R LE NMR via the following therapeutic tasks: - ascending/descending 8 steps using 1 HR, pt initially only using L UE support on rail demoing R UE inattention, cued pt to place L hand in his pocket and only use R UE support for increased R attention - ascended reciprocally and descended with step-to leading with R LE  - standing card-matching task with items placed R of midline forcing R visual scanning and attention using R UE with CGA/min assist for balance and max cuing to recall name of each card suit (pt would call everything "spades" despite only having hearts and diamonds cards) - pt demos impaired  RUE fine motor coordination dropping cards 3x but with increased time and min guarding for balance able to pick them up from the floor - demos slow speed of movement with R UE and requires increased time to complete this cognitive taks - dynamic gait training via forward, backwards, and lateral side stepping with dual task of stepping a specified number of times and counting them aloud - pt with delayed counting, unable to keep up with his steps and unable to recall number of steps specified to perform  Reassessed vitals during session: BP 118/90 (MAP 99), HR 62bpm   At end of session, gait training ~154ft, no AD, back to room with CGA/min assist for balance - continues to demo overall increased postural sway and B LE externally rotated (believe this may be pt's typical gait pattern but with R LE ER exaggerated). Pt left sitting in recliner with needs in reach,  seat belt alarm on, and NT present. Therapist reviewed how to use call button to call for nursing assistance.   Session 2: Pt received sitting in recliner asleep, and upon awakening reports he is "exhausted" but with minimal encouragement agreeable to therapy session. Sit<>stands, no AD, with close supervision/CGA  throughout session as pt demos increased postural sway upon coming to stand. Pt noted to be incontinent of bladder, pt unaware, performed sit<>stand LB clothing management and peri-care with set-up assist and CGA/close supervision for balance safety with pt often using backs of legs against bed to stabilize himself in standing. Pt suddenly reports need to urinate again - gait into bathroom with CGA for steadying due to urgency and pt continently voids bladder in standing though unsuccessful at capturing it in the toilet fully. Standing hand hygiene at sink with supervision for balance. Gait training ~171ft to ADL apartment, no AD, with CGA and intermittent min assist for balance as pt continues to demo increased postural sway with occasional minor LOB in varying directions - pt is able to implement stepping strategy for balance recovery but LOB occurs more frequent than is appropriate for the challenge of the walking task. Participated in gait training in ADL apartment kitchen while participating in [place] and dual-task challenge to locate fruit through the cabinets, drawers, and fridge in a specified written order - pt frequently having to refer to written list to recall the order to find items; however, demos ability to recall the location of the fruit to then later go back to collect it with only 3 mistakes - continues to require CGA and intermittent min assist for balance recovery due to intermittent minor LOB. Pt also demos minor LOB during static standing balance tasks with head turns and would benefit from additional training with this. Participated in standing balance and R UE NMR task to don gloves and clean fruit with wipes - cuing for more normal involvement of RUE into the task - CGA/min assist during minor LOB. Dynamic gait training ~14ft back to room, no AD, with CGA/min assist for balance while participating in horizontal and vertical head turns on verbal command - pt continues to be unable to  differentiate R from L without max multimodal cuing and demos delayed processing of the verbal commands on when to turn his head. Sit>supine supervision in bed. Pt left supine in bed, HOB maximally elevated, needs in reach, bed alarm on, and meal tray set-up.   Therapy Documentation Precautions:  Precautions Precautions: Fall Precaution Comments: R side inattention Restrictions Weight Bearing Restrictions: No  Pain:   Session 1: Denies pain during session.  Session 2: Denies pain during session.   Therapy/Group: Individual Therapy  Tawana Scale , PT, DPT, CSRS  12/26/2020, 7:54 AM

## 2020-12-26 NOTE — Progress Notes (Signed)
PROGRESS NOTE   Subjective/Complaints: Pt up in bed. Eating breakfast. Had no complaints.   ROS: Patient denies fever, rash, sore throat, blurred vision, nausea, vomiting, diarrhea, cough, shortness of breath or chest pain, joint or back pain, headache, or mood change.   Objective:   No results found. No results for input(s): WBC, HGB, HCT, PLT in the last 72 hours. No results for input(s): NA, K, CL, CO2, GLUCOSE, BUN, CREATININE, CALCIUM in the last 72 hours.  Intake/Output Summary (Last 24 hours) at 12/26/2020 0835 Last data filed at 12/25/2020 1740 Gross per 24 hour  Intake 413 ml  Output --  Net 413 ml        Physical Exam: Vital Signs Blood pressure 140/82, pulse (!) 55, temperature 98.1 F (36.7 C), resp. rate 17, SpO2 100 %. Constitutional: No distress . Vital signs reviewed. HEENT: EOMI, oral membranes moist Neck: supple Cardiovascular: RRR without murmur. No JVD    Respiratory/Chest: CTA Bilaterally without wheezes or rales. Normal effort    GI/Abdomen: BS +, non-tender, non-distended Ext: no clubbing, cyanosis, or edema Psych: pleasant and cooperative Skin: intact  Neuro: Cranial nerves II through XII intact, motor strength is 5/5 in Left , 4/5 Right  deltoid, bicep, tricep, grip, hip flexor, knee extensors, ankle dorsiflexor and plantar flexor Normal sensory and cerebellar exam Musculoskeletal: Full range of motion in all 4 extremities. No joint swelling   Assessment/Plan: 1. Functional deficits which require 3+ hours per day of interdisciplinary therapy in a comprehensive inpatient rehab setting.  Physiatrist is providing close team supervision and 24 hour management of active medical problems listed below.  Physiatrist and rehab team continue to assess barriers to discharge/monitor patient progress toward functional and medical goals  Care Tool:  Bathing    Body parts bathed by patient: Right  arm,Left arm,Chest,Abdomen,Front perineal area,Buttocks,Right upper leg,Left upper leg,Right lower leg,Left lower leg,Face         Bathing assist Assist Level: Contact Guard/Touching assist     Upper Body Dressing/Undressing Upper body dressing   What is the patient wearing?: Pull over shirt    Upper body assist Assist Level: Supervision/Verbal cueing    Lower Body Dressing/Undressing Lower body dressing      What is the patient wearing?: Pants     Lower body assist Assist for lower body dressing: Contact Guard/Touching assist     Toileting Toileting    Toileting assist Assist for toileting: Minimal Assistance - Patient > 75%     Transfers Chair/bed transfer  Transfers assist     Chair/bed transfer assist level: Contact Guard/Touching assist     Locomotion Ambulation   Ambulation assist      Assist level: Contact Guard/Touching assist Assistive device: No Device Max distance: 200   Walk 10 feet activity   Assist     Assist level: Contact Guard/Touching assist Assistive device: No Device   Walk 50 feet activity   Assist    Assist level: Contact Guard/Touching assist Assistive device: No Device    Walk 150 feet activity   Assist Walk 150 feet activity did not occur: Safety/medical concerns  Assist level: Contact Guard/Touching assist Assistive device: No Device  Walk 10 feet on uneven surface  activity   Assist     Assist level: Minimal Assistance - Patient > 75% Assistive device: Hand held assist   Wheelchair     Assist Will patient use wheelchair at discharge?: No             Wheelchair 50 feet with 2 turns activity    Assist            Wheelchair 150 feet activity     Assist          Blood pressure 140/82, pulse (!) 55, temperature 98.1 F (36.7 C), resp. rate 17, SpO2 100 %.  Medical Problem List and Plan: 1.  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  Antithrombotics: -DVT/anticoagulation: Lovenox             -antiplatelet therapy: Brilinta 90 mg twice daily 3. Pain Management: Continue Tylenol as needed 4. Mood: Continue Lexapro 20 mg daily, melatonin 10 mg nightly as needed             -antipsychotic agents: N/A 5. Neuropsych: This patient is not capable of making decisions on his own behalf. 6. Skin/Wound Care: Routine skin checks 7. Fluids/Electrolytes/Nutrition: Routine in and outs with follow-up chemistries 8.  Epilepsy/seizures, Lamictal 50 mg twice daily, Depakote 1000 mg every 12 hours.  EEG negative for seizure 9.  Hyperlipidemia.  Lipitor 10.  Hypertension.  Continue Norvasc 10 mg daily, HCTZ 25 mg daily Imdur 30 mg daily.  Monitor with increased mobility Vitals:   12/25/20 2000 12/26/20 0817  BP: 112/68 140/82  Pulse: 70 (!) 55  Resp: 17   Temp: 98.1 F (36.7 C)   SpO2: 100%   controlled 5/28 11.  Diabetes mellitus.  Hemoglobin A1c 9.7.  SSI.  Patient on Lantus 40 units nightly prior to admission.  Resume as needed            CBG (last 3)  Recent Labs    12/25/20 1629 12/25/20 2021 12/26/20 0736  GLUCAP 188* 210* 126*  labile 5/28---resume lantus at 8u qam (on hs at home), pm sugars higher 12.  CAD with myocardial infarction.  Patient now on Brilinta 13.  History of tobacco polysubstance abuse.  Urine drug screen positive marijuana.  Provide counseling 14.  AKI on CKD admission creatinine 2.15 improved to 1.39.  Follow-up chemistries             -encourage adequate intake             -wasn't motivated to eat much while I was in room today. 15.  Questionable medical noncompliance.  Provide counseling 16. Lethargy: more alert  17.  Pancytopenia - mild - will consult pharmacy to see which meds are most likely involved   LOS: 5 days A FACE TO [place] 12/26/2020, 8:35 AM

## 2020-12-26 NOTE — Progress Notes (Signed)
Occupational Therapy Session Note  Patient Details  Name: Melvin Taylor MRN: [id number] Date of Birth: [birth year]  Today's Date: 12/26/2020 OT Individual Time: 0906-1000 OT Individual Time Calculation (min): 54 min    Short Term Goals: Week 1:  OT Short Term Goal 1 (Week 1): STGs equal to LTGs set at supervision to modified independent level.  Skilled Therapeutic Interventions/Progress Updates:    Pt in bed to start session, agreeable to showering.  He was able to stand from the bed with min guard assist and work on picking out his clothing from her dresser.  Mod questioning cueing to recall not getting his pants out.  When presented with items he had chosen, he was unable to name them when shown to him.  He was able to ambulate to the shower with min assist and no device.  Increased sway and LOB to the right.  He stood to remove clothing at min assist and then transferred over to the shower seat for bathing.  He needed min instructional cueing for coordination putting soap on the washcloth instead of trying to just apply it to his arm, while holding the washcloth secondary to apraxia.  He sequenced all bathing with min instructional cueing for thoroughness and for sitting down as he would frequently try to stand throughout.  Once he dried off, he transferred out to the EOB for dressing with mod instructional cueing again to not try and stand to put his LB clothing on.  From seated position he completed UB dressing with supervision and LB dressing with min guard assist for underpants and pants.  Total assist was needed for TEDs with supervision for gripper socks.  Finished session with ambulation over to the sink at min assist and completion of oral hygiene in standing with min instructional cueing for emergent awareness and sequencing.  He returned to the bed to rest with call button and phone in reach and safety alarm in place.    Therapy Documentation Precautions:  Precautions Precautions:  Fall Precaution Comments: R side inattention Restrictions Weight Bearing Restrictions: No  Pain: Pain Assessment Pain Scale: Faces Pain Score: 0-No pain ADL: See Care Tool Section for some details of mobility and selfcare  Therapy/Group: Individual Therapy  Trek Kimball OTR/L 12/26/2020, 10:02 AM

## 2020-12-27 LAB — GLUCOSE, CAPILLARY
Glucose-Capillary: 59 mg/dL — ABNORMAL LOW (ref 70–99)
Glucose-Capillary: 70 mg/dL (ref 70–99)
Glucose-Capillary: 176 mg/dL — ABNORMAL HIGH (ref 70–99)
Glucose-Capillary: 131 mg/dL — ABNORMAL HIGH (ref 70–99)
Glucose-Capillary: 136 mg/dL — ABNORMAL HIGH (ref 70–99)

## 2020-12-27 MED ORDER — FLEET ENEMA 7-19 GM/118ML RE ENEM: 1.0000 | ENEMA | Freq: Every day | RECTAL | Status: DC | PRN

## 2020-12-27 MED ORDER — SORBITOL 70 % SOLN
60.0000 mL | Status: AC
  Administered 2020-12-27: 60 mL via ORAL
  Filled 2020-12-27: qty 60

## 2020-12-27 MED ORDER — BISACODYL 10 MG RE SUPP: 10.0000 mg | Freq: Every day | RECTAL | Status: DC | PRN

## 2020-12-27 NOTE — Plan of Care (Signed)
  Problem: Consults Goal: RH STROKE PATIENT EDUCATION Description: See Patient Education module for education specifics  Outcome: Progressing   Problem: RH BOWEL ELIMINATION Goal: RH STG MANAGE BOWEL WITH ASSISTANCE Description: STG Manage Bowel with  mod I Assistance. Outcome: Progressing Goal: RH STG MANAGE BOWEL W/MEDICATION W/ASSISTANCE Description: STG Manage Bowel with Medication with  mod I Assistance. Outcome: Progressing   Problem: RH BLADDER ELIMINATION Goal: RH STG MANAGE BLADDER WITH ASSISTANCE Description: STG Manage Bladder With  mod I Assistance Outcome: Progressing   Problem: RH SAFETY Goal: RH STG ADHERE TO SAFETY PRECAUTIONS W/ASSISTANCE/DEVICE Description: STG Adhere to Safety Precautions With cues/reminders Assistance/Device. Outcome: Progressing   Problem: RH KNOWLEDGE DEFICIT Goal: RH STG INCREASE KNOWLEDGE OF DIABETES Description: Patient and caregiver will be able to manage DM with medications and dietary modifications using handouts and educational resources independently Outcome: Progressing Goal: RH STG INCREASE KNOWLEDGE OF HYPERTENSION Description: _Patient and caregiver will be able to manage HTN with medications and dietary modifications using handouts and educational resources independently Outcome: Progressing Goal: RH STG INCREASE KNOWLEGDE OF HYPERLIPIDEMIA Description: Patient and caregiver will be able to manage HLD with medications and dietary modifications using handouts and educational resources independently Outcome: Progressing Goal: RH STG INCREASE KNOWLEDGE OF STROKE PROPHYLAXIS Description: Patient and caregiver will be able to manage secondary stroke risks with medications and dietary modifications using handouts and educational resources independently Outcome: Progressing   Problem: RH Vision Goal: RH LTG Vision (Specify) Outcome: Progressing

## 2020-12-28 LAB — GLUCOSE, CAPILLARY
Glucose-Capillary: 50 mg/dL — ABNORMAL LOW (ref 70–99)
Glucose-Capillary: 56 mg/dL — ABNORMAL LOW (ref 70–99)
Glucose-Capillary: 84 mg/dL (ref 70–99)
Glucose-Capillary: 104 mg/dL — ABNORMAL HIGH (ref 70–99)
Glucose-Capillary: 198 mg/dL — ABNORMAL HIGH (ref 70–99)
Glucose-Capillary: 182 mg/dL — ABNORMAL HIGH (ref 70–99)

## 2020-12-28 MED ORDER — INSULIN GLARGINE 100 UNIT/ML ~~LOC~~ SOLN
5.0000 [IU] | Freq: Every day | SUBCUTANEOUS | Status: DC
  Administered 2020-12-29 – 2020-12-30 (×2): 5 [IU] via SUBCUTANEOUS
  Filled 2020-12-28 (×3): qty 0.05

## 2020-12-28 NOTE — Discharge Summary (Signed)
Physician Discharge Summary  Patient ID: Melvin Taylor MRN: [id number] DOB/AGE: [birth year] 55 y.o.  Admit date: 12/21/2020 Discharge date: 12/30/2020  Discharge Diagnoses:  Principal Problem:   Acute ischemic left MCA stroke [facility]) Active Problems:   Left middle cerebral artery stroke [facility]) DVT prophylaxis Epilepsy/seizures Hyperlipidemia Hypertension Diabetes mellitus CAD with myocardial infarction History of tobacco/polysubstance abuse AKI on CKD Questionable medical compliance  Discharged Condition: Stable  Significant Diagnostic Studies: CT HEAD WO CONTRAST  Result Date: 12/17/2020 CLINICAL DATA:  55 year old male code stroke presentation with left MCA M1 ELVO status post endovascular reperfusion. EXAM: CT HEAD WITHOUT CONTRAST TECHNIQUE: Contiguous axial images were obtained from the base of the skull through the vertex without intravenous contrast. COMPARISON:  CTA, CTP and CT head yesterday. FINDINGS: Brain: Pseudo normalization of the left inferior frontal gyrus which was hypodense at presentation yesterday (series 3, image 19). Trace subarachnoid hemorrhage in the left central sulcus on series 3, image 24. Trace contrast staining suspected in the left inferior basal ganglia series 3, image 15. And questionable additional mild gyral contrast staining in the anterior left MCA territory. Scattered areas of subcortical and central white matter hypodensity in the left frontal lobe are otherwise stable to decreased in conspicuity (series 3, image 22). But there is no confluent cytotoxic edema evident, and no intracranial mass effect or midline shift. No ventriculomegaly. No other intracranial hemorrhage identified. Vascular: Left MCA M1 region vascular stent is new since presentation. Skull: No acute osseous abnormality identified. Sinuses/Orbits: Trace fluid and mucosal thickening in the paranasal sinuses now. Mastoids and tympanic cavities remain clear. Other: Intubated. Fluid in the  nasopharynx. Negative orbit and scalp soft tissues. IMPRESSION: 1. Some pseudo-normalization of the left inferior frontal gyrus hypodensity since the presentation CT yesterday which might be related to contrast staining. But there is otherwise little to no cytotoxic edema evident in the Left MCA territory. 2. Trace subarachnoid hemorrhage in the left hemisphere. No malignant hemorrhagic transformation or intracranial mass effect. 3. New left MCA M1 stent. Electronically Signed   By: Genevie Ann M.D.   On: 12/17/2020 06:15   CT Head Wo Contrast  Result Date: 12/12/2020 CLINICAL DATA:  55 year old with acute onset of headache and possible fever that began yesterday. Current history of hypertension. EXAM: CT HEAD WITHOUT CONTRAST TECHNIQUE: Contiguous axial images were obtained from the base of the skull through the vertex without intravenous contrast. COMPARISON:  06/09/2018 and earlier. FINDINGS: Brain: Ventricular system normal in size and appearance for age. No mass lesion. No midline shift. No acute hemorrhage or hematoma. No extra-axial fluid collections. No evidence of acute infarction. No interval change. Vascular: Mild BILATERAL carotid siphon atherosclerosis. No hyperdense vessel. Skull: No skull fracture or other focal osseous abnormality involving the skull. Sinuses/Orbits: Visualized paranasal sinuses, bilateral mastoid air cells and bilateral middle ear cavities well-aerated. Visualized orbits and globes normal in appearance. Other: None. IMPRESSION: No acute intracranial abnormality.  Stable examination. Electronically Signed   By: Evangeline Dakin M.D.   On: 12/12/2020 11:57   MR ANGIO HEAD WO CONTRAST  Result Date: 12/17/2020 CLINICAL DATA:  Stroke, follow-up. EXAM: MRI HEAD WITHOUT CONTRAST MRA HEAD WITHOUT CONTRAST TECHNIQUE: Multiplanar, multi-echo pulse sequences of the brain and surrounding structures were acquired without intravenous contrast. Angiographic images of the Circle of Willis were  acquired using MRA technique without intravenous contrast. COMPARISON: No pertinent prior exam. COMPARISON:  Noncontrast head CT 12/16/2020. CT angiogram head/neck and CT perfusion 12/16/2020. FINDINGS: MRI HEAD FINDINGS Brain: Mild intermittent motion  degradation. Mild cerebral and cerebellar atrophy. Scattered acute infarcts within the left MCA and MCA/PCA watershed territories affecting the cortical/subcortical left frontal, parietal and occipital lobes. Multiple acute infarcts are also present within the left insula and left basal ganglia. Suspected small chronic cortical infarct within the right occipital lobe (series 8, image 10). No evidence of intracranial mass. No chronic intracranial blood products. No extra-axial fluid collection. No midline shift. Vascular: Expected proximal arterial flow voids. SWI signal loss along the course of the M1 and M2 left MCA likely reflecting stent artifact. Skull and upper cervical spine: No focal marrow lesion. Sinuses/Orbits: Visualized orbits show no acute finding. Trace bilateral ethmoid and maxillary sinus mucosal thickening. MRA HEAD FINDINGS Anterior circulation: The intracranial internal carotid arteries are patent. Mild stent artifact from a stent within the M1/M2 left MCA. The M1 left middle cerebral artery is now patent. Atherosclerotic irregularity of the M2 and more distal left MCA vessels. Most notably, there are sites of apparent high-grade stenosis within an inferior division proximal left M2 vessel. No proximal left M2 branch occlusion is identified. The M1 right MCA is patent. Redemonstrated moderate stenosis within the proximal right M 1 segment. No right M2 proximal branch occlusion or high-grade proximal stenosis. The anterior cerebral arteries are patent. No intracranial aneurysm is identified. Posterior circulation: The intracranial vertebral arteries are patent. The basilar artery is patent. The posterior cerebral arteries are patent. Redemonstrated  atherosclerotic irregularity of the bilateral posterior cerebral arteries, most notably as follows. Multifocal severe stenoses within the P2 right PCA. Sites of up to moderate/severe stenosis within the P2 left PCA. Posterior communicating arteries are hypoplastic or absent bilaterally. Anatomic variants: As described IMPRESSION: MRI brain: 1. Scattered acute infarcts within the left MCA and MCA/PCA watershed territories affecting the cortical/subcortical left frontal, parietal and occipital lobes. Multiple acute infarcts are also present within the left insula and basal ganglia. No significant mass effect. No hemorrhagic conversion. 2. Suspected small chronic cortical infarct within the right occipital lobe. 3. Mild generalized parenchymal atrophy. MRA head: 1. Mild artifact arising from a stent within the M1/M2 left MCA. The M1 left MCA is now patent. Atherosclerotic irregularity of the M2 and more distal left MCA vessels. Most notably, there is apparent high-grade stenosis within an inferior division proximal left M2 vessel. 2. Redemonstrated moderate stenosis within the proximal M1 right MCA. 3. Redemonstrated sites of severe stenosis within the P2 right PCA. 4. Redemonstrated sites of up to moderate/severe stenosis within the P2 left PCA. Electronically Signed   By: Kellie Simmering DO   On: 12/17/2020 16:01   MR BRAIN WO CONTRAST  Result Date: 12/17/2020 CLINICAL DATA:  Stroke, follow-up. EXAM: MRI HEAD WITHOUT CONTRAST MRA HEAD WITHOUT CONTRAST TECHNIQUE: Multiplanar, multi-echo pulse sequences of the brain and surrounding structures were acquired without intravenous contrast. Angiographic images of the Circle of Willis were acquired using MRA technique without intravenous contrast. COMPARISON: No pertinent prior exam. COMPARISON:  Noncontrast head CT 12/16/2020. CT angiogram head/neck and CT perfusion 12/16/2020. FINDINGS: MRI HEAD FINDINGS Brain: Mild intermittent motion degradation. Mild cerebral and  cerebellar atrophy. Scattered acute infarcts within the left MCA and MCA/PCA watershed territories affecting the cortical/subcortical left frontal, parietal and occipital lobes. Multiple acute infarcts are also present within the left insula and left basal ganglia. Suspected small chronic cortical infarct within the right occipital lobe (series 8, image 10). No evidence of intracranial mass. No chronic intracranial blood products. No extra-axial fluid collection. No midline shift. Vascular: Expected proximal arterial flow  voids. SWI signal loss along the course of the M1 and M2 left MCA likely reflecting stent artifact. Skull and upper cervical spine: No focal marrow lesion. Sinuses/Orbits: Visualized orbits show no acute finding. Trace bilateral ethmoid and maxillary sinus mucosal thickening. MRA HEAD FINDINGS Anterior circulation: The intracranial internal carotid arteries are patent. Mild stent artifact from a stent within the M1/M2 left MCA. The M1 left middle cerebral artery is now patent. Atherosclerotic irregularity of the M2 and more distal left MCA vessels. Most notably, there are sites of apparent high-grade stenosis within an inferior division proximal left M2 vessel. No proximal left M2 branch occlusion is identified. The M1 right MCA is patent. Redemonstrated moderate stenosis within the proximal right M 1 segment. No right M2 proximal branch occlusion or high-grade proximal stenosis. The anterior cerebral arteries are patent. No intracranial aneurysm is identified. Posterior circulation: The intracranial vertebral arteries are patent. The basilar artery is patent. The posterior cerebral arteries are patent. Redemonstrated atherosclerotic irregularity of the bilateral posterior cerebral arteries, most notably as follows. Multifocal severe stenoses within the P2 right PCA. Sites of up to moderate/severe stenosis within the P2 left PCA. Posterior communicating arteries are hypoplastic or absent  bilaterally. Anatomic variants: As described IMPRESSION: MRI brain: 1. Scattered acute infarcts within the left MCA and MCA/PCA watershed territories affecting the cortical/subcortical left frontal, parietal and occipital lobes. Multiple acute infarcts are also present within the left insula and basal ganglia. No significant mass effect. No hemorrhagic conversion. 2. Suspected small chronic cortical infarct within the right occipital lobe. 3. Mild generalized parenchymal atrophy. MRA head: 1. Mild artifact arising from a stent within the M1/M2 left MCA. The M1 left MCA is now patent. Atherosclerotic irregularity of the M2 and more distal left MCA vessels. Most notably, there is apparent high-grade stenosis within an inferior division proximal left M2 vessel. 2. Redemonstrated moderate stenosis within the proximal M1 right MCA. 3. Redemonstrated sites of severe stenosis within the P2 right PCA. 4. Redemonstrated sites of up to moderate/severe stenosis within the P2 left PCA. Electronically Signed   By: Kellie Simmering DO   On: 12/17/2020 16:01   IR Intra Cran Stent  INDICATION: New onset of aphasia, right-sided hemiplegia and left gaze deviation. 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  EXAM: 1. EMERGENT LARGE VESSEL OCCLUSION THROMBOLYSIS (anterior CIRCULATION)  IR PERCUTANEOUS ART THORMBECTOMY/INFUSION INTRACRANIAL INCLUDE DIAG ANGIO  COMPARISON:  CT angiogram of the head and neck of Dec 16, 2020.  MEDICATIONS: Ancef 2 g IV antibiotic was administered within 1 hour of the procedure.  ANESTHESIA/SEDATION: General anesthesia.  CONTRAST:  Omnipaque 300 120 mL.  FLUOROSCOPY TIME:  Fluoroscopy Time: 76 minutes 18 seconds (2434 mGy).  COMPLICATIONS: None immediate.  TECHNIQUE: Following a full explanation of the procedure along with the potential associated complications, an informed witnessed consent was obtained. The risks of intracranial hemorrhage of 10%,  worsening neurological deficit, ventilator dependency, death and inability to revascularize were all reviewed in detail with the patient's sister.  The patient was then put under general anesthesia by the Department of Anesthesiology at [facility].  The right groin was prepped and draped in the usual sterile fashion. Thereafter using modified Seldinger technique, transfemoral access into the right common femoral artery was obtained without difficulty. Over a 0.035 inch guidewire an 8 [place] Pinnacle 25 cm sheath was inserted. Through this, and also over a 0.035 inch guidewire a 5 0.5 [place] JB 1 catheter  inside of an 087 Emboguard balloon catheter was advanced to the aortic arch region and selectively positioned in the left common carotid artery.  The 035 inch guidewire, and the support catheter were retrieved and removed. Good aspiration obtained from the hub of the Emboguard balloon guide catheter in the left common carotid artery. An arteriogram was performed centered extra cranially and intracranially.  FINDINGS: The left common carotid arteriogram demonstrates the left external carotid artery and its major branches to be widely patent.  The left internal carotid artery at the bulb to the cranial skull base is widely patent though associated with an O configuration tortuosity at the junction of the distal and the middle 1/3 of the left internal carotid artery.  More distally the distal cervical left ICA at the petrous, cavernous and supraclinoid segments demonstrate wide patency.  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  The left anterior cerebral artery opacifies into the capillary and venous phases.  PROCEDURE: The balloon guide catheter was advanced into the proximal 1/3 of the left internal carotid artery over a 0.035 inch Roadrunner guidewire.  The guidewire was removed. Through this, a combination of an 027 136 cm Zoom aspiration  catheter with an 021 162 cm Trevo ProVue microcatheter was advanced in combination over a 0.014 inch standard Synchro micro guidewire to the supraclinoid left ICA.  The guidewire was then advanced using a torque device through the occluded left middle cerebral artery into an inferior branch M2 M3 region followed by the microcatheter.  The 071 Zoom aspiration catheter was advanced and positioned in the occluded left middle cerebral artery.  The guidewire was removed. Good aspiration was obtained from the hub of the microcatheter. A gentle control arteriogram performed through the microcatheter demonstrated safe position of the tip of the microcatheter. This was then connected to continuous heparinized saline infusion.  A Tiger 17 retrieval device was then prepped and purged submerged in heparinized saline infusion.  This was then advanced in a coaxial manner and with constant heparinized saline infusion to the distal end of the microcatheter.  The retrieval device was then deployed such that the proximal marker was at the proximal end of the occluded left middle cerebral artery with a Zoom aspiration catheter just proximal to this.  The Tiger 17 device was then expanded and decreased in caliber over approximately 2 minutes.  With constant aspiration at the hub of the Zoom aspiration catheter, using Penumbra aspiration device, and proximal flow arrest in the proximal left internal carotid artery over approximately 2 minutes, the combination of the retrieval device was retrieved as was the Zoom aspiration catheter. After flow reversal, control arteriogram performed through the balloon guide catheter in the left internal carotid artery demonstrated now flow through the left middle cerebral artery into the superior and inferior divisions revealing severe segmental stenosis of the left middle cerebral artery. Sticky clot was noted adhering to the retrieval device.  A second pass was then made, using the same  combination. The micro guidewire was again advanced into the inferior division M2 M3 region followed by the microcatheter. After having ascertained safe position of the tip of the microcatheter as described above, the Tiger 17 retrieval device was again deployed in the manner as described above.  The proximal device was locked to the microcatheter at the proximal aspirate of the occlusion. Constant aspiration was then applied the hub of the balloon guide catheter in the left internal carotid artery with a 20  mL syringe, and Penumbra aspiration device at the hub of the Zoom aspiration catheter for approximately 2 minutes. The retrieval device was expanded and decreased in caliber over approximately 2 minutes. The combination of the retrieval device, the microcatheter and the Zoom aspiration catheter were retrieved and removed. Free flow was noted from the Zoom aspiration catheter in the distal 1/3 of the left internal carotid artery.  Again clot was seen within the retrieval device.  Control arteriogram performed following reversal of flow arrest demonstrated now complete angiographic occlusion of the left middle cerebral artery.  It was suspect reocclusion was probably related to underlying severe intracranial arteriosclerosis.  Through the Zoom aspiration catheter in the cavernous left ICA, where a 0.014 inch standard Synchro micro guidewire, an 021 Trevo ProVue microcatheter was again advanced into the inferior division M2 M3 region. The guidewire was removed. Good aspiration obtained from the hub of the microcatheter. This was then connected to continuous heparinized saline infusion.  A 4 mm x 40 mm Solitaire retrieval device was then advanced to the distal end of the microcatheter and deployed in the usual manner.  This was left patent for approximately 3 minutes. Control arteriogram performed through the Zoom aspiration catheter in the supraclinoid left ICA demonstrated now flow through the retrieval  device into the left MCA branches achieving a TICI 3 revascularization. Also noted again was a segmental severe stenosis of the mid to distal left middle cerebral artery due to intracranial arteriosclerosis.  The retrieval device was recaptured into the microcatheter which was advanced slightly more distally into the M2 M3 regions. The retrieval device was removed. Good aspiration obtained from the hub of the microcatheter which was then connected to continuous heparinized saline infusion.  It was elected to proceed with placement of an intracranial stent to prevent reocclusion. A 4 mm x 24 mm Neuroform ATLAS stent was selected.  This was prepped and purged in heparinized saline infusion and advanced in a coaxial manner to the distal end of the microcatheter.  The proximal and the distal landing zones were identified.  The O ring on the delivery microcatheter was loosened. With slight forward gentle traction with the right hand on the delivery micro guidewire, the distal and then the proximal portion of the stent was deployed. Control arteriogram performed through the Zoom aspiration catheter in the left internal carotid artery now demonstrated revascularization of the left middle cerebral artery and left anterior cerebral artery achieving a TICI 3 revascularization.  A 2 mm x 9 mm Gateway balloon angioplasty microcatheter was then prepped and purged with heparinized saline infusion and 50% contrast and 50% heparinized saline infusion.  Over a 0.014 inch standard Synchro micro guidewire, a balloon angioplasty catheter was advanced to the supraclinoid left ICA.  With a torque device, the micro guidewire was advanced through the stent and into the inferior division left middle cerebral artery M2 M3 region. The balloon was then positioned with the proximal and distal markers covering the entirety of the severe stenosis.  A control inflation was then performed via micro inflation syringe device via micro tubing  to approximately 1.95 mm where it was maintained for approximately 30 seconds. Balloon was deflated and retrieved proximally. Control arteriogram performed through the balloon guide catheter in the left internal carotid artery demonstrates significantly improved caliber and flow through the left MCA stented segment and its distal branches.  The left anterior cerebral artery remained patent with cross flow via the anterior communicating artery into the right anterior cerebral A2  segment.  Control arteriogram was then performed in approximately 20 minutes which continued to demonstrate excellent flow through the left middle cerebral artery with significantly increased caliber and hemodynamic flow into the left MCA distribution.  The balloon guide catheter was retrieved and removed. The 8 [place] Pinnacle sheath was removed with hemostasis achieved in the right groin with a 7 [place] ExoSeal closure device and manual compression over 15 minutes.  Distal pulses remained present in both feet at the end of the procedure.  CT of the brain was performed prior to placement of the stent which demonstrated no evidence of intracranial hemorrhage. A second CT of the brain at the end of the procedure continued to demonstrate no evidence of intracranial hemorrhage or mass effect or midline shift.  The patient was loaded with Brilinta 180 mg prior to the placement of the stent. A bolus dose of IV cangrelor was given at the time of placement of the stent, with a 4 hour low-dose infusion for 4 hours.  Patient was left intubated on account of unresponsiveness prior to the intubation.  His pupils were symmetrical though sluggish bilaterally.  He was then transferred to the neuro PACU for post revascularization management.  IMPRESSION: Status post endovascular revascularization of occluded left middle cerebral M1 segment with 2 passes with the Tiger 17 retrieval device and contact aspiration achieving a TICI 3 revascularization.  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  PLAN: Follow-up in the clinic 2 weeks post discharge.   Electronically Signed   By: Luanne Bras M.D.   On: 12/17/2020 12:14   IR CT Head Ltd  Result Date: 12/21/2020 INDICATION: New onset of aphasia, right-sided hemiplegia and left gaze deviation. 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 EXAM: 1. EMERGENT LARGE VESSEL OCCLUSION THROMBOLYSIS (anterior CIRCULATION) IR PERCUTANEOUS ART THORMBECTOMY/INFUSION INTRACRANIAL INCLUDE DIAG ANGIO COMPARISON:  CT angiogram of the head and neck of Dec 16, 2020. MEDICATIONS: Ancef 2 g IV antibiotic was administered within 1 hour of the procedure. ANESTHESIA/SEDATION: General anesthesia. CONTRAST:  Omnipaque 300 120 mL. FLUOROSCOPY TIME:  Fluoroscopy Time: 76 minutes 18 seconds (2434 mGy). COMPLICATIONS: None immediate. TECHNIQUE: Following a full explanation of the procedure along with the potential associated complications, an informed witnessed consent was obtained. The risks of intracranial hemorrhage of 10%, worsening neurological deficit, ventilator dependency, death and inability to revascularize were all reviewed in detail with the patient's sister. The patient was then put under general anesthesia by the Department of Anesthesiology at [facility]. The right groin was prepped and draped in the usual sterile fashion. Thereafter using modified Seldinger technique, transfemoral access into the right common femoral artery was obtained without difficulty. Over a 0.035 inch guidewire an 8 [place] Pinnacle 25 cm sheath was inserted. Through this, and also over a 0.035 inch guidewire a 5 0.5 [place] JB 1 catheter inside of an 087 Emboguard balloon catheter was advanced to the aortic arch region and selectively positioned in the left common carotid  artery. The 035 inch guidewire, and the support catheter were retrieved and removed. Good aspiration obtained from the hub of the Emboguard balloon guide catheter in the left common carotid artery. An arteriogram was performed centered extra cranially and intracranially. FINDINGS: The left common carotid arteriogram demonstrates the left external carotid artery and its major branches to be widely patent. The left internal carotid artery at the bulb  to the cranial skull base is widely patent though associated with an O configuration tortuosity at the junction of the distal and the middle 1/3 of the left internal carotid artery. More distally the distal cervical left ICA at the petrous, cavernous and supraclinoid segments demonstrate wide patency. 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 The left anterior cerebral artery opacifies into the capillary and venous phases. PROCEDURE: The balloon guide catheter was advanced into the proximal 1/3 of the left internal carotid artery over a 0.035 inch Roadrunner guidewire. The guidewire was removed. Through this, a combination of an 027 136 cm Zoom aspiration catheter with an 021 162 cm Trevo ProVue microcatheter was advanced in combination over a 0.014 inch standard Synchro micro guidewire to the supraclinoid left ICA. The guidewire was then advanced using a torque device through the occluded left middle cerebral artery into an inferior branch M2 M3 region followed by the microcatheter. The 071 Zoom aspiration catheter was advanced and positioned in the occluded left middle cerebral artery. The guidewire was removed. Good aspiration was obtained from the hub of the microcatheter. A gentle control arteriogram performed through the microcatheter demonstrated safe position of the tip of the microcatheter. This was then connected to continuous heparinized saline infusion. A Tiger 17 retrieval device was then prepped and  purged submerged in heparinized saline infusion. This was then advanced in a coaxial manner and with constant heparinized saline infusion to the distal end of the microcatheter. The retrieval device was then deployed such that the proximal marker was at the proximal end of the occluded left middle cerebral artery with a Zoom aspiration catheter just proximal to this. The Tiger 17 device was then expanded and decreased in caliber over approximately 2 minutes. With constant aspiration at the hub of the Zoom aspiration catheter, using Penumbra aspiration device, and proximal flow arrest in the proximal left internal carotid artery over approximately 2 minutes, the combination of the retrieval device was retrieved as was the Zoom aspiration catheter. After flow reversal, control arteriogram performed through the balloon guide catheter in the left internal carotid artery demonstrated now flow through the left middle cerebral artery into the superior and inferior divisions revealing severe segmental stenosis of the left middle cerebral artery. Sticky clot was noted adhering to the retrieval device. A second pass was then made, using the same combination. The micro guidewire was again advanced into the inferior division M2 M3 region followed by the microcatheter. After having ascertained safe position of the tip of the microcatheter as described above, the Tiger 17 retrieval device was again deployed in the manner as described above. The proximal device was locked to the microcatheter at the proximal aspirate of the occlusion. Constant aspiration was then applied the hub of the balloon guide catheter in the left internal carotid artery with a 20 mL syringe, and Penumbra aspiration device at the hub of the Zoom aspiration catheter for approximately 2 minutes. The retrieval device was expanded and decreased in caliber over approximately 2 minutes. The combination of the retrieval device, the microcatheter and the Zoom  aspiration catheter were retrieved and removed. Free flow was noted from the Zoom aspiration catheter in the distal 1/3 of the left internal carotid artery. Again clot was seen within the retrieval device. Control arteriogram performed following reversal of flow arrest demonstrated now complete angiographic occlusion of the left middle cerebral artery. It was suspect reocclusion was probably related to underlying severe intracranial arteriosclerosis. Through the  Zoom aspiration catheter in the cavernous left ICA, where a 0.014 inch standard Synchro micro guidewire, an 021 Trevo ProVue microcatheter was again advanced into the inferior division M2 M3 region. The guidewire was removed. Good aspiration obtained from the hub of the microcatheter. This was then connected to continuous heparinized saline infusion. A 4 mm x 40 mm Solitaire retrieval device was then advanced to the distal end of the microcatheter and deployed in the usual manner. This was left patent for approximately 3 minutes. Control arteriogram performed through the Zoom aspiration catheter in the supraclinoid left ICA demonstrated now flow through the retrieval device into the left MCA branches achieving a TICI 3 revascularization. Also noted again was a segmental severe stenosis of the mid to distal left middle cerebral artery due to intracranial arteriosclerosis. The retrieval device was recaptured into the microcatheter which was advanced slightly more distally into the M2 M3 regions. The retrieval device was removed. Good aspiration obtained from the hub of the microcatheter which was then connected to continuous heparinized saline infusion. It was elected to proceed with placement of an intracranial stent to prevent reocclusion. A 4 mm x 24 mm Neuroform ATLAS stent was selected. This was prepped and purged in heparinized saline infusion and advanced in a coaxial manner to the distal end of the microcatheter. The proximal and the distal landing  zones were identified. The O ring on the delivery microcatheter was loosened. With slight forward gentle traction with the right hand on the delivery micro guidewire, the distal and then the proximal portion of the stent was deployed. Control arteriogram performed through the Zoom aspiration catheter in the left internal carotid artery now demonstrated revascularization of the left middle cerebral artery and left anterior cerebral artery achieving a TICI 3 revascularization. A 2 mm x 9 mm Gateway balloon angioplasty microcatheter was then prepped and purged with heparinized saline infusion and 50% contrast and 50% heparinized saline infusion. Over a 0.014 inch standard Synchro micro guidewire, a balloon angioplasty catheter was advanced to the supraclinoid left ICA. With a torque device, the micro guidewire was advanced through the stent and into the inferior division left middle cerebral artery M2 M3 region. The balloon was then positioned with the proximal and distal markers covering the entirety of the severe stenosis. A control inflation was then performed via micro inflation syringe device via micro tubing to approximately 1.95 mm where it was maintained for approximately 30 seconds. Balloon was deflated and retrieved proximally. Control arteriogram performed through the balloon guide catheter in the left internal carotid artery demonstrates significantly improved caliber and flow through the left MCA stented segment and its distal branches. The left anterior cerebral artery remained patent with cross flow via the anterior communicating artery into the right anterior cerebral A2 segment. Control arteriogram was then performed in approximately 20 minutes which continued to demonstrate excellent flow through the left middle cerebral artery with significantly increased caliber and hemodynamic flow into the left MCA distribution. The balloon guide catheter was retrieved and removed. The 8 French Pinnacle sheath was  removed with hemostasis achieved in the right groin with a 7 [place] ExoSeal closure device and manual compression over 15 minutes. Distal pulses remained present in both feet at the end of the procedure. CT of the brain was performed prior to placement of the stent which demonstrated no evidence of intracranial hemorrhage. A second CT of the brain at the end of the procedure continued to demonstrate no evidence of intracranial hemorrhage or mass effect  or midline shift. The patient was loaded with Brilinta 180 mg prior to the placement of the stent. A bolus dose of IV cangrelor was given at the time of placement of the stent, with a 4 hour low-dose infusion for 4 hours. Patient was left intubated on account of unresponsiveness prior to the intubation. His pupils were symmetrical though sluggish bilaterally. He was then transferred to the neuro PACU for post revascularization management. IMPRESSION: Status post endovascular revascularization of occluded left middle cerebral M1 segment with 2 passes with the Tiger 17 retrieval device and contact aspiration achieving a TICI 3 revascularization. 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 PLAN: Follow-up in the clinic 2 weeks post discharge. Electronically Signed   By: Luanne Bras M.D.   On: 12/17/2020 12:14   IR CT Head Ltd  Result Date: 12/21/2020 INDICATION: New onset of aphasia, right-sided hemiplegia and left gaze deviation. 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 EXAM: 1. EMERGENT LARGE VESSEL OCCLUSION THROMBOLYSIS (anterior CIRCULATION) IR PERCUTANEOUS ART THORMBECTOMY/INFUSION INTRACRANIAL INCLUDE DIAG ANGIO COMPARISON:  CT angiogram of the head and neck of Dec 16, 2020. MEDICATIONS: Ancef 2 g IV antibiotic was administered within 1 hour of the procedure. ANESTHESIA/SEDATION:  General anesthesia. CONTRAST:  Omnipaque 300 120 mL. FLUOROSCOPY TIME:  Fluoroscopy Time: 76 minutes 18 seconds (2434 mGy). COMPLICATIONS: None immediate. TECHNIQUE: Following a full explanation of the procedure along with the potential associated complications, an informed witnessed consent was obtained. The risks of intracranial hemorrhage of 10%, worsening neurological deficit, ventilator dependency, death and inability to revascularize were all reviewed in detail with the patient's sister. The patient was then put under general anesthesia by the Department of Anesthesiology at [facility]. The right groin was prepped and draped in the usual sterile fashion. Thereafter using modified Seldinger technique, transfemoral access into the right common femoral artery was obtained without difficulty. Over a 0.035 inch guidewire an 8 [place] Pinnacle 25 cm sheath was inserted. Through this, and also over a 0.035 inch guidewire a 5 0.5 [place] JB 1 catheter inside of an 087 Emboguard balloon catheter was advanced to the aortic arch region and selectively positioned in the left common carotid artery. The 035 inch guidewire, and the support catheter were retrieved and removed. Good aspiration obtained from the hub of the Emboguard balloon guide catheter in the left common carotid artery. An arteriogram was performed centered extra cranially and intracranially. FINDINGS: The left common carotid arteriogram demonstrates the left external carotid artery and its major branches to be widely patent. The left internal carotid artery at the bulb to the cranial skull base is widely patent though associated with an O configuration tortuosity at the junction of the distal and the middle 1/3 of the left internal carotid artery. More distally the distal cervical left ICA at the petrous, cavernous and supraclinoid segments demonstrate wide patency. Complete angiographic occlusion of the proximal left middle cerebral artery M1  segment is evident. The left anterior cerebral artery opacifies into the capillary and venous phases. PROCEDURE: The balloon guide catheter was advanced into the proximal 1/3 of the left internal carotid artery over a 0.035 inch Roadrunner guidewire. The guidewire was removed. Through this, a combination of an 027 136 cm Zoom aspiration catheter with an 021 162 cm Trevo ProVue microcatheter was advanced in combination over a 0.014 inch standard Synchro micro guidewire to the supraclinoid  left ICA. The guidewire was then advanced using a torque device through the occluded left middle cerebral artery into an inferior branch M2 M3 region followed by the microcatheter. The 071 Zoom aspiration catheter was advanced and positioned in the occluded left middle cerebral artery. The guidewire was removed. Good aspiration was obtained from the hub of the microcatheter. A gentle control arteriogram performed through the microcatheter demonstrated safe position of the tip of the microcatheter. This was then connected to continuous heparinized saline infusion. A Tiger 17 retrieval device was then prepped and purged submerged in heparinized saline infusion. This was then advanced in a coaxial manner and with constant heparinized saline infusion to the distal end of the microcatheter. The retrieval device was then deployed such that the proximal marker was at the proximal end of the occluded left middle cerebral artery with a Zoom aspiration catheter just proximal to this. The Tiger 17 device was then expanded and decreased in caliber over approximately 2 minutes. With constant aspiration at the hub of the Zoom aspiration catheter, using Penumbra aspiration device, and proximal flow arrest in the proximal left internal carotid artery over approximately 2 minutes, the combination of the retrieval device was retrieved as was the Zoom aspiration catheter. After flow reversal, control arteriogram performed through the balloon guide  catheter in the left internal carotid artery demonstrated now flow through the left middle cerebral artery into the superior and inferior divisions revealing severe segmental stenosis of the left middle cerebral artery. Sticky clot was noted adhering to the retrieval device. A second pass was then made, using the same combination. The micro guidewire was again advanced into the inferior division M2 M3 region followed by the microcatheter. After having ascertained safe position of the tip of the microcatheter as described above, the Tiger 17 retrieval device was again deployed in the manner as described above. The proximal device was locked to the microcatheter at the proximal aspirate of the occlusion. Constant aspiration was then applied the hub of the balloon guide catheter in the left internal carotid artery with a 20 mL syringe, and Penumbra aspiration device at the hub of the Zoom aspiration catheter for approximately 2 minutes. The retrieval device was expanded and decreased in caliber over approximately 2 minutes. The combination of the retrieval device, the microcatheter and the Zoom aspiration catheter were retrieved and removed. Free flow was noted from the Zoom aspiration catheter in the distal 1/3 of the left internal carotid artery. Again clot was seen within the retrieval device. Control arteriogram performed following reversal of flow arrest demonstrated now complete angiographic occlusion of the left middle cerebral artery. It was suspect reocclusion was probably related to underlying severe intracranial arteriosclerosis. Through the Zoom aspiration catheter in the cavernous left ICA, where a 0.014 inch standard Synchro micro guidewire, an 021 Trevo ProVue microcatheter was again advanced into the inferior division M2 M3 region. The guidewire was removed. Good aspiration obtained from the hub of the microcatheter. This was then connected to continuous heparinized saline infusion. A 4 mm x 40 mm  Solitaire retrieval device was then advanced to the distal end of the microcatheter and deployed in the usual manner. This was left patent for approximately 3 minutes. Control arteriogram performed through the Zoom aspiration catheter in the supraclinoid left ICA demonstrated now flow through the retrieval device into the left MCA branches achieving a TICI 3 revascularization. Also noted again was a segmental severe stenosis of the mid to distal left middle cerebral artery due to intracranial  arteriosclerosis. The retrieval device was recaptured into the microcatheter which was advanced slightly more distally into the M2 M3 regions. The retrieval device was removed. Good aspiration obtained from the hub of the microcatheter which was then connected to continuous heparinized saline infusion. It was elected to proceed with placement of an intracranial stent to prevent reocclusion. A 4 mm x 24 mm Neuroform ATLAS stent was selected. This was prepped and purged in heparinized saline infusion and advanced in a coaxial manner to the distal end of the microcatheter. The proximal and the distal landing zones were identified. The O ring on the delivery microcatheter was loosened. With slight forward gentle traction with the right hand on the delivery micro guidewire, the distal and then the proximal portion of the stent was deployed. Control arteriogram performed through the Zoom aspiration catheter in the left internal carotid artery now demonstrated revascularization of the left middle cerebral artery and left anterior cerebral artery achieving a TICI 3 revascularization. A 2 mm x 9 mm Gateway balloon angioplasty microcatheter was then prepped and purged with heparinized saline infusion and 50% contrast and 50% heparinized saline infusion. Over a 0.014 inch standard Synchro micro guidewire, a balloon angioplasty catheter was advanced to the supraclinoid left ICA. With a torque device, the micro guidewire was advanced  through the stent and into the inferior division left middle cerebral artery M2 M3 region. The balloon was then positioned with the proximal and distal markers covering the entirety of the severe stenosis. A control inflation was then performed via micro inflation syringe device via micro tubing to approximately 1.95 mm where it was maintained for approximately 30 seconds. Balloon was deflated and retrieved proximally. Control arteriogram performed through the balloon guide catheter in the left internal carotid artery demonstrates significantly improved caliber and flow through the left MCA stented segment and its distal branches. The left anterior cerebral artery remained patent with cross flow via the anterior communicating artery into the right anterior cerebral A2 segment. Control arteriogram was then performed in approximately 20 minutes which continued to demonstrate excellent flow through the left middle cerebral artery with significantly increased caliber and hemodynamic flow into the left MCA distribution. The balloon guide catheter was retrieved and removed. The 8 French Pinnacle sheath was removed with hemostasis achieved in the right groin with a 7 [place] ExoSeal closure device and manual compression over 15 minutes. Distal pulses remained present in both feet at the end of the procedure. CT of the brain was performed prior to placement of the stent which demonstrated no evidence of intracranial hemorrhage. A second CT of the brain at the end of the procedure continued to demonstrate no evidence of intracranial hemorrhage or mass effect or midline shift. The patient was loaded with Brilinta 180 mg prior to the placement of the stent. A bolus dose of IV cangrelor was given at the time of placement of the stent, with a 4 hour low-dose infusion for 4 hours. Patient was left intubated on account of unresponsiveness prior to the intubation. His pupils were symmetrical though sluggish bilaterally. He was then  transferred to the neuro PACU for post revascularization management. IMPRESSION: Status post endovascular revascularization of occluded left middle cerebral M1 segment with 2 passes with the Tiger 17 retrieval device and contact aspiration achieving a TICI 3 revascularization. 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 PLAN: Follow-up in the clinic 2 weeks post discharge. Electronically Signed  By: Luanne Bras M.D.   On: 12/17/2020 12:14   CT CEREBRAL PERFUSION W CONTRAST  Result Date: 12/16/2020 CLINICAL DATA:  Right-sided deficits EXAM: CT ANGIOGRAPHY HEAD AND NECK CT PERFUSION BRAIN TECHNIQUE: Multidetector CT imaging of the head and neck was performed using the standard protocol during bolus administration of intravenous contrast. Multiplanar CT image reconstructions and MIPs were obtained to evaluate the vascular anatomy. Carotid stenosis measurements (when applicable) are obtained utilizing NASCET criteria, using the distal internal carotid diameter as the denominator. Multiphase CT imaging of the brain was performed following IV bolus contrast injection. Subsequent parametric perfusion maps were calculated using RAPID software. CONTRAST:  170mL OMNIPAQUE IOHEXOL 350 MG/ML SOLN COMPARISON:  None. FINDINGS: CTA NECK FINDINGS SKELETON: There is no bony spinal canal stenosis. No lytic or blastic lesion. OTHER NECK: Normal pharynx, larynx and major salivary glands. No cervical lymphadenopathy. Unremarkable thyroid gland. UPPER CHEST: No pneumothorax or pleural effusion. No nodules or masses. AORTIC ARCH: There is no calcific atherosclerosis of the aortic arch. There is no aneurysm, dissection or hemodynamically significant stenosis of the visualized portion of the aorta. Conventional 3 vessel aortic branching pattern. The visualized proximal subclavian arteries are widely  patent. RIGHT CAROTID SYSTEM: Normal without aneurysm, dissection or stenosis. LEFT CAROTID SYSTEM: Normal without aneurysm, dissection or stenosis. VERTEBRAL ARTERIES: Left dominant configuration. Both origins are clearly patent. There is no dissection, occlusion or flow-limiting stenosis to the skull base (V1-V3 segments). CTA HEAD FINDINGS POSTERIOR CIRCULATION: --Vertebral arteries: Normal V4 segments. --Inferior cerebellar arteries: Normal. --Basilar artery: Normal. --Superior cerebellar arteries: Normal. --Posterior cerebral arteries (PCA): Multifocal moderate-to-severe stenosis of the right P2 segment. Normal left. ANTERIOR CIRCULATION: --Intracranial internal carotid arteries: Normal. --Anterior cerebral arteries (ACA): Normal. Both A1 segments are present. Patent anterior communicating artery (a-comm). --Middle cerebral arteries (MCA): Complete occlusion of the left MCA at the proximal M1 segment with limited collateralization. Right MCA is normal. VENOUS SINUSES: As permitted by contrast timing, patent. ANATOMIC VARIANTS: None Review of the MIP images confirms the above findings. CT Brain Perfusion Findings: ASPECTS: 9 CBF (<30%) Volume: 42mL Perfusion (Tmax>6.0s) volume: 149mL Mismatch Volume: 117mL, likely slightly overestimated due to small area of completed infarction seen on the noncontrast head CT. Infarction Location:Left MCA territory IMPRESSION: 1. 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 2. Large area of ischemic penumbra in the left MCA territory. 3. 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 Critical Value/emergent results were called by telephone at the time of interpretation on 12/16/2020 at 9:02 pm to provider Christ Kick , who verbally acknowledged these results. Aortic Atherosclerosis (ICD10-I70.0). Electronically Signed   By: Ulyses Jarred M.D.   On:  12/16/2020 21:08   DG CHEST PORT 1 VIEW  Result Date: 12/17/2020 CLINICAL DATA:  Intubation. EXAM: PORTABLE CHEST 1 VIEW COMPARISON:  12/12/2020. FINDINGS: Endotracheal tube tip noted 4 cm above the carina. NG tube tip below left hemidiaphragm. Prior CABG. Heart size normal. Low lung volumes. Mild left base atelectasis/infiltrate cannot be excluded. Mild elevation left hemidiaphragm. No pleural effusion or pneumothorax. Prior cervical spine fusion. IMPRESSION: 1.  Endotracheal tube and NG tube in good anatomic position. 2.  Prior CABG.  Heart size normal. 3. Mild left base atelectasis/infiltrate cannot be excluded. Mild elevation left hemidiaphragm. Electronically Signed   By: Marcello Moores  Register   On: 12/17/2020 05:18   DG Chest Port 1 View  Result Date: 12/12/2020 CLINICAL DATA:  Patient complaining  of headache and overall not feeling well since yesterday. States that he has not taken BP meds in one day. Reports generalized pain and believes he may have been running fever at home. EXAM: PORTABLE CHEST - 1 VIEW COMPARISON:  09/14/2020 FINDINGS: Lungs are clear.  Previous CABG. Heart size and mediastinal contours are within normal limits. Aortic Atherosclerosis (ICD10-170.0). No effusion.  No pneumothorax. Visualized bones unremarkable. IMPRESSION: No acute cardiopulmonary disease post CABG. Electronically Signed   By: Lucrezia Europe M.D.   On: 12/12/2020 11:48   EEG adult  Result Date: 12/17/2020 Lora Havens, MD     12/17/2020  1:00 PM Patient Name: Melvin Taylor MRN: [id number] Epilepsy Attending: Lora Havens Referring Physician/Provider: Dr. 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  EEG to evaluate for seizures. Level of alertness: Awake AEDs during EEG study: Lamotrigine, Depakote Technical aspects: This EEG study was done with scalp  electrodes positioned according to the 10-20 International system of electrode placement. Electrical activity was acquired at a sampling rate of 500Hz  and reviewed with a high frequency filter of 70Hz  and a low frequency filter of 1Hz . EEG data were recorded continuously and digitally stored. Description: The posterior dominant rhythm consists of 8 Hz activity of moderate voltage (25-35 uV) seen predominantly in posterior head regions, symmetric and reactive to eye opening and eye closing. EEG showed intermittent 3 to 6 Hz theta-delta slowing in left hemisphere.  Photic driving was not seen during photic stimulation. Hyperventilation was not performed.   ABNORMALITY - Intermittent slow, left hemisphere IMPRESSION: This study is suggestive of cortical dysfunction arising from left hemisphere, nonspecific etiology. No seizures or epileptiform discharges were seen throughout the recording. Lora Havens   VAS [place] GROIN PSEUDOANEURYSM  Result Date: 12/21/2020  ARTERIAL PSEUDOANEURYSM  Patient Name:  Melvin Taylor  Date of Exam:   12/19/2020 Medical Rec #: [id number]          Accession #:    [id number] Date of Birth: [birth year]           Patient Gender: M Patient Age:   54Y Exam Location:  [facility] Procedure:      VAS [place] Gloriajean Dell Referring Phys: [id number] [place] --------------------------------------------------------------------------------  Exam: Right groin Indications: Decreased hematocrit. History: S/p right groin access. Comparison Study: No prior study Performing Technologist: Maudry Mayhew MHA, RDMS, RVT, RDCS  Examination Guidelines: A complete evaluation includes B-mode imaging, spectral Doppler, color Doppler, and power Doppler as needed of all accessible portions of each vessel. Bilateral testing is considered an integral part of a complete examination. Limited examinations for reoccurring indications may be performed as noted.  +------------+----------+--------+------+----------+ Right DuplexPSV (cm/s)WaveformPlaqueComment(s) +------------+----------+--------+------+----------+ CFA            224                             +------------+----------+--------+------+----------+ Prox SFA       159                             +------------+----------+--------+------+----------+  Summary: No evidence of pseudoaneurysm, AVF or DVT  Diagnosing physician: Monica Martinez MD Electronically signed by Monica Martinez MD on 12/21/2020 at 3:26:59 PM.   --------------------------------------------------------------------------------    Final    ECHOCARDIOGRAM COMPLETE  Result Date: 12/17/2020    ECHOCARDIOGRAM REPORT   Patient Name:   Melvin Taylor Date of Exam: 12/17/2020 Medical Rec #:  [id number]         Height:       67.0 in Accession #:    [id number]        Weight:       157.0 lb Date of Birth:  1966-06-06          BSA:          1.825 m Patient Age:    42 years          BP:           96/70 mmHg Patient Gender: M                 HR:           60 bpm. Exam Location:  Inpatient Procedure: 2D Echo, Cardiac Doppler and Color Doppler Indications:    CVA  History:        Patient has prior history of Echocardiogram examinations, most                 recent 03/04/2019. Previous Myocardial Infarction and CAD, Prior                 CABG, Signs/Symptoms:Shortness of Breath; Risk Factors:Diabetes.                 03/28/2016 CABG                 03/07/2016 cath.  Sonographer:    Luisa Hart RDCS Referring Phys: [id number] Lorenza Chick  Sonographer Comments: Echo performed with patient supine and on artificial respirator. IMPRESSIONS  1. Left ventricular ejection fraction, by estimation, is 65 to 70%. The left ventricle has normal function. The left ventricle has no regional wall motion abnormalities. There is moderate left ventricular hypertrophy. Left ventricular diastolic parameters are consistent with Grade I diastolic dysfunction  (impaired relaxation).  2. Right ventricular systolic function is normal. The right ventricular size is normal. There is normal pulmonary artery systolic pressure. The estimated right ventricular systolic pressure is 85.8 mmHg.  3. The mitral valve is normal in structure. Trivial mitral valve regurgitation. No evidence of mitral stenosis.  4. The aortic valve is normal in structure. Aortic valve regurgitation is not visualized. No aortic stenosis is present.  5. Aortic dilatation noted. There is borderline dilatation of the ascending aorta, measuring 38 mm.  6. The inferior vena cava is normal in size with greater than 50% respiratory variability, suggesting right atrial pressure of 3 mmHg. Conclusion(s)/Recommendation(s): No intracardiac source of embolism detected on this transthoracic study. A transesophageal echocardiogram is recommended to exclude cardiac source of embolism if clinically indicated. FINDINGS  Left Ventricle: Left ventricular ejection fraction, by estimation, is 65 to 70%. The left ventricle has normal function. The left ventricle has no regional wall motion abnormalities. The left ventricular internal cavity size was normal in size. There is  moderate left ventricular hypertrophy. Left ventricular diastolic parameters are consistent with Grade I diastolic dysfunction (impaired relaxation). Right Ventricle: The right ventricular size is normal. No increase in right ventricular wall thickness. Right ventricular systolic function is normal. There is normal pulmonary artery systolic pressure. The tricuspid regurgitant velocity is 2.14 m/s, and  with an assumed right atrial pressure of 3 mmHg, the estimated right ventricular systolic pressure is 85.0 mmHg. Left Atrium: Left atrial size was normal in size. Right Atrium: Right  atrial size was normal in size. Pericardium: There is no evidence of pericardial effusion. Mitral Valve: The mitral valve is normal in structure. Trivial mitral valve  regurgitation. No evidence of mitral valve stenosis. MV peak gradient, 8.0 mmHg. The mean mitral valve gradient is 3.0 mmHg. Tricuspid Valve: The tricuspid valve is normal in structure. Tricuspid valve regurgitation is mild . No evidence of tricuspid stenosis. Aortic Valve: The aortic valve is normal in structure. Aortic valve regurgitation is not visualized. No aortic stenosis is present. Aortic valve mean gradient measures 3.0 mmHg. Aortic valve peak gradient measures 6.2 mmHg. Aortic valve area, by VTI measures 3.55 cm. Pulmonic Valve: The pulmonic valve was thickened with good excursion. Pulmonic valve regurgitation is mild. No evidence of pulmonic stenosis. Aorta: Aortic dilatation noted. There is borderline dilatation of the ascending aorta, measuring 38 mm. Venous: The inferior vena cava is normal in size with greater than 50% respiratory variability, suggesting right atrial pressure of 3 mmHg. IAS/Shunts: There is redundancy of the interatrial septum. No atrial level shunt detected by color flow Doppler.  LEFT VENTRICLE PLAX 2D LVIDd:         3.50 cm  Diastology LVIDs:         1.70 cm  LV e' medial:    7.40 cm/s LV PW:         1.50 cm  LV E/e' medial:  11.9 LV IVS:        1.50 cm  LV e' lateral:   9.36 cm/s LVOT diam:     2.20 cm  LV E/e' lateral: 9.4 LV SV:         95 LV SV Index:   52 LVOT Area:     3.80 cm  RIGHT VENTRICLE RV Basal diam:  4.30 cm RV Mid diam:    2.60 cm LEFT ATRIUM             Index       RIGHT ATRIUM           Index LA diam:        3.20 cm 1.75 cm/m  RA Area:     11.60 cm LA Vol (A2C):   24.9 ml 13.65 ml/m RA Volume:   26.10 ml  14.30 ml/m LA Vol (A4C):   33.7 ml 18.47 ml/m LA Biplane Vol: 30.6 ml 16.77 ml/m  AORTIC VALVE                   PULMONIC VALVE AV Area (Vmax):    3.25 cm    PV Vmax:       0.76 m/s AV Area (Vmean):   3.28 cm    PV Vmean:      50.700 cm/s AV Area (VTI):     3.55 cm    PV VTI:        0.159 m AV Vmax:           125.00 cm/s PV Peak grad:  2.3 mmHg AV  Vmean:          83.600 cm/s PV Mean grad:  1.0 mmHg AV VTI:            0.269 m AV Peak Grad:      6.2 mmHg AV Mean Grad:      3.0 mmHg LVOT Vmax:         107.00 cm/s LVOT Vmean:        72.200 cm/s LVOT VTI:          0.251 m  LVOT/AV VTI ratio: 0.93  AORTA Ao Root diam: 3.70 cm Ao Asc diam:  3.80 cm MITRAL VALVE                TRICUSPID VALVE MV Area (PHT): 2.76 cm     TR Peak grad:   18.3 mmHg MV Area VTI:   2.07 cm     TR Vmax:        214.00 cm/s MV Peak grad:  8.0 mmHg MV Mean grad:  3.0 mmHg     SHUNTS MV Vmax:       1.41 m/s     Systemic VTI:  0.25 m MV Vmean:      80.8 cm/s    Systemic Diam: 2.20 cm MV Decel Time: 275 msec MV E velocity: 88.10 cm/s MV A velocity: 109.00 cm/s MV E/A ratio:  0.81 Cherlynn Kaiser MD Electronically signed by Cherlynn Kaiser MD Signature Date/Time: 12/17/2020/1:53:18 PM    Final    IR PERCUTANEOUS ART THROMBECTOMY/INFUSION INTRACRANIAL INC DIAG ANGIO  Result Date: 12/21/2020 INDICATION: New onset of aphasia, right-sided hemiplegia and left gaze deviation. 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 EXAM: 1. EMERGENT LARGE VESSEL OCCLUSION THROMBOLYSIS (anterior CIRCULATION) IR PERCUTANEOUS ART THORMBECTOMY/INFUSION INTRACRANIAL INCLUDE DIAG ANGIO COMPARISON:  CT angiogram of the head and neck of Dec 16, 2020. MEDICATIONS: Ancef 2 g IV antibiotic was administered within 1 hour of the procedure. ANESTHESIA/SEDATION: General anesthesia. CONTRAST:  Omnipaque 300 120 mL. FLUOROSCOPY TIME:  Fluoroscopy Time: 76 minutes 18 seconds (2434 mGy). COMPLICATIONS: None immediate. TECHNIQUE: Following a full explanation of the procedure along with the potential associated complications, an informed witnessed consent was obtained. The risks of intracranial hemorrhage of 10%, worsening neurological deficit, ventilator dependency, death and inability to revascularize were all reviewed in detail with the patient's sister. The patient was then put under  general anesthesia by the Department of Anesthesiology at [facility]. The right groin was prepped and draped in the usual sterile fashion. Thereafter using modified Seldinger technique, transfemoral access into the right common femoral artery was obtained without difficulty. Over a 0.035 inch guidewire an 8 [place] Pinnacle 25 cm sheath was inserted. Through this, and also over a 0.035 inch guidewire a 5 0.5 [place] JB 1 catheter inside of an 087 Emboguard balloon catheter was advanced to the aortic arch region and selectively positioned in the left common carotid artery. The 035 inch guidewire, and the support catheter were retrieved and removed. Good aspiration obtained from the hub of the Emboguard balloon guide catheter in the left common carotid artery. An arteriogram was performed centered extra cranially and intracranially. FINDINGS: The left common carotid arteriogram demonstrates the left external carotid artery and its major branches to be widely patent. The left internal carotid artery at the bulb to the cranial skull base is widely patent though associated with an O configuration tortuosity at the junction of the distal and the middle 1/3 of the left internal carotid artery. More distally the distal cervical left ICA at the petrous, cavernous and supraclinoid segments demonstrate wide patency. 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 The left anterior cerebral artery opacifies into the capillary and venous phases. PROCEDURE: The balloon guide catheter was advanced into the proximal 1/3 of the left internal carotid artery over a 0.035 inch Roadrunner guidewire. The guidewire was removed. Through this, a combination of an 027 136 cm Zoom aspiration catheter with an 021  162 cm Trevo ProVue microcatheter was advanced in combination over a 0.014 inch standard Synchro micro guidewire to the supraclinoid left ICA. The guidewire was then advanced  using a torque device through the occluded left middle cerebral artery into an inferior branch M2 M3 region followed by the microcatheter. The 071 Zoom aspiration catheter was advanced and positioned in the occluded left middle cerebral artery. The guidewire was removed. Good aspiration was obtained from the hub of the microcatheter. A gentle control arteriogram performed through the microcatheter demonstrated safe position of the tip of the microcatheter. This was then connected to continuous heparinized saline infusion. A Tiger 17 retrieval device was then prepped and purged submerged in heparinized saline infusion. This was then advanced in a coaxial manner and with constant heparinized saline infusion to the distal end of the microcatheter. The retrieval device was then deployed such that the proximal marker was at the proximal end of the occluded left middle cerebral artery with a Zoom aspiration catheter just proximal to this. The Tiger 17 device was then expanded and decreased in caliber over approximately 2 minutes. With constant aspiration at the hub of the Zoom aspiration catheter, using Penumbra aspiration device, and proximal flow arrest in the proximal left internal carotid artery over approximately 2 minutes, the combination of the retrieval device was retrieved as was the Zoom aspiration catheter. After flow reversal, control arteriogram performed through the balloon guide catheter in the left internal carotid artery demonstrated now flow through the left middle cerebral artery into the superior and inferior divisions revealing severe segmental stenosis of the left middle cerebral artery. Sticky clot was noted adhering to the retrieval device. A second pass was then made, using the same combination. The micro guidewire was again advanced into the inferior division M2 M3 region followed by the microcatheter. After having ascertained safe position of the tip of the microcatheter as described above, the  Tiger 17 retrieval device was again deployed in the manner as described above. The proximal device was locked to the microcatheter at the proximal aspirate of the occlusion. Constant aspiration was then applied the hub of the balloon guide catheter in the left internal carotid artery with a 20 mL syringe, and Penumbra aspiration device at the hub of the Zoom aspiration catheter for approximately 2 minutes. The retrieval device was expanded and decreased in caliber over approximately 2 minutes. The combination of the retrieval device, the microcatheter and the Zoom aspiration catheter were retrieved and removed. Free flow was noted from the Zoom aspiration catheter in the distal 1/3 of the left internal carotid artery. Again clot was seen within the retrieval device. Control arteriogram performed following reversal of flow arrest demonstrated now complete angiographic occlusion of the left middle cerebral artery. It was suspect reocclusion was probably related to underlying severe intracranial arteriosclerosis. Through the Zoom aspiration catheter in the cavernous left ICA, where a 0.014 inch standard Synchro micro guidewire, an 021 Trevo ProVue microcatheter was again advanced into the inferior division M2 M3 region. The guidewire was removed. Good aspiration obtained from the hub of the microcatheter. This was then connected to continuous heparinized saline infusion. A 4 mm x 40 mm Solitaire retrieval device was then advanced to the distal end of the microcatheter and deployed in the usual manner. This was left patent for approximately 3 minutes. Control arteriogram performed through the Zoom aspiration catheter in the supraclinoid left ICA demonstrated now flow through the retrieval device into the left MCA branches achieving a TICI 3 revascularization.  Also noted again was a segmental severe stenosis of the mid to distal left middle cerebral artery due to intracranial arteriosclerosis. The retrieval device was  recaptured into the microcatheter which was advanced slightly more distally into the M2 M3 regions. The retrieval device was removed. Good aspiration obtained from the hub of the microcatheter which was then connected to continuous heparinized saline infusion. It was elected to proceed with placement of an intracranial stent to prevent reocclusion. A 4 mm x 24 mm Neuroform ATLAS stent was selected. This was prepped and purged in heparinized saline infusion and advanced in a coaxial manner to the distal end of the microcatheter. The proximal and the distal landing zones were identified. The O ring on the delivery microcatheter was loosened. With slight forward gentle traction with the right hand on the delivery micro guidewire, the distal and then the proximal portion of the stent was deployed. Control arteriogram performed through the Zoom aspiration catheter in the left internal carotid artery now demonstrated revascularization of the left middle cerebral artery and left anterior cerebral artery achieving a TICI 3 revascularization. A 2 mm x 9 mm Gateway balloon angioplasty microcatheter was then prepped and purged with heparinized saline infusion and 50% contrast and 50% heparinized saline infusion. Over a 0.014 inch standard Synchro micro guidewire, a balloon angioplasty catheter was advanced to the supraclinoid left ICA. With a torque device, the micro guidewire was advanced through the stent and into the inferior division left middle cerebral artery M2 M3 region. The balloon was then positioned with the proximal and distal markers covering the entirety of the severe stenosis. A control inflation was then performed via micro inflation syringe device via micro tubing to approximately 1.95 mm where it was maintained for approximately 30 seconds. Balloon was deflated and retrieved proximally. Control arteriogram performed through the balloon guide catheter in the left internal carotid artery demonstrates  significantly improved caliber and flow through the left MCA stented segment and its distal branches. The left anterior cerebral artery remained patent with cross flow via the anterior communicating artery into the right anterior cerebral A2 segment. Control arteriogram was then performed in approximately 20 minutes which continued to demonstrate excellent flow through the left middle cerebral artery with significantly increased caliber and hemodynamic flow into the left MCA distribution. The balloon guide catheter was retrieved and removed. The 8 [place] Pinnacle sheath was removed with hemostasis achieved in the right groin with a 7 [place] ExoSeal closure device and manual compression over 15 minutes. Distal pulses remained present in both feet at the end of the procedure. CT of the brain was performed prior to placement of the stent which demonstrated no evidence of intracranial hemorrhage. A second CT of the brain at the end of the procedure continued to demonstrate no evidence of intracranial hemorrhage or mass effect or midline shift. The patient was loaded with Brilinta 180 mg prior to the placement of the stent. A bolus dose of IV cangrelor was given at the time of placement of the stent, with a 4 hour low-dose infusion for 4 hours. Patient was left intubated on account of unresponsiveness prior to the intubation. His pupils were symmetrical though sluggish bilaterally. He was then transferred to the neuro PACU for post revascularization management. IMPRESSION: Status post endovascular revascularization of occluded left middle cerebral M1 segment with 2 passes with the Tiger 17 retrieval device and contact aspiration achieving a TICI 3 revascularization. 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 PLAN: Follow-up in the clinic 2 weeks post discharge. Electronically Signed   By: Luanne Bras M.D.   On: 12/17/2020 12:14   CT HEAD CODE STROKE WO CONTRAST  Result Date: 12/16/2020 CLINICAL DATA:  Code stroke.  Right-sided deficits EXAM: CT HEAD WITHOUT CONTRAST TECHNIQUE: Contiguous axial images were obtained from the base of the skull through the vertex without intravenous contrast. COMPARISON:  12/12/2020 FINDINGS: Brain: There is no mass, hemorrhage or extra-axial collection. The size and configuration of the ventricles and extra-axial CSF spaces are normal. Area of hypoattenuation in the inferior left frontal lobe, likely early subacute infarct. Small vessel infarcts in the left corona radiata are unchanged. Vascular: No abnormal hyperdensity of the major intracranial arteries or dural venous sinuses. No intracranial atherosclerosis. Skull: The visualized skull base, calvarium and extracranial soft tissues are normal. Sinuses/Orbits: No fluid levels or advanced mucosal thickening of the visualized paranasal sinuses. No mastoid or middle ear effusion. The orbits are normal. ASPECTS [facility] Stroke Program Early CT Score) - Ganglionic level infarction (caudate, lentiform nuclei, internal capsule, insula, M1-M3 cortex): 6 - Supraganglionic infarction (M4-M6 cortex): 3 Total score (0-10 with 10 being normal): 9 IMPRESSION: 1. No acute intracranial hemorrhage. 2. Suspected early subacute infarct of the inferior left frontal lobe. 3. ASPECTS is 9. These results were called by telephone at the time of interpretation on 12/16/2020 at 8:25 pm to provider [facility] , who verbally acknowledged these results. Electronically Signed   By: Ulyses Jarred M.D.   On: 12/16/2020 20:26   CT ANGIO HEAD CODE STROKE  Result Date: 12/16/2020 CLINICAL DATA:  Right-sided deficits EXAM: CT ANGIOGRAPHY HEAD AND NECK CT PERFUSION BRAIN TECHNIQUE: Multidetector CT imaging of the head and neck was performed using the standard protocol during bolus administration of intravenous contrast. Multiplanar CT image  reconstructions and MIPs were obtained to evaluate the vascular anatomy. Carotid stenosis measurements (when applicable) are obtained utilizing NASCET criteria, using the distal internal carotid diameter as the denominator. Multiphase CT imaging of the brain was performed following IV bolus contrast injection. Subsequent parametric perfusion maps were calculated using RAPID software. CONTRAST:  136mL OMNIPAQUE IOHEXOL 350 MG/ML SOLN COMPARISON:  None. FINDINGS: CTA NECK FINDINGS SKELETON: There is no bony spinal canal stenosis. No lytic or blastic lesion. OTHER NECK: Normal pharynx, larynx and major salivary glands. No cervical lymphadenopathy. Unremarkable thyroid gland. UPPER CHEST: No pneumothorax or pleural effusion. No nodules or masses. AORTIC ARCH: There is no calcific atherosclerosis of the aortic arch. There is no aneurysm, dissection or hemodynamically significant stenosis of the visualized portion of the aorta. Conventional 3 vessel aortic branching pattern. The visualized proximal subclavian arteries are widely patent. RIGHT CAROTID SYSTEM: Normal without aneurysm, dissection or stenosis. LEFT CAROTID SYSTEM: Normal without aneurysm, dissection or stenosis. VERTEBRAL ARTERIES: Left dominant configuration. Both origins are clearly patent. There is no dissection, occlusion or flow-limiting stenosis to the skull base (V1-V3 segments). CTA HEAD FINDINGS POSTERIOR CIRCULATION: --Vertebral arteries: Normal V4 segments. --Inferior cerebellar arteries: Normal. --Basilar artery: Normal. --Superior cerebellar arteries: Normal. --Posterior cerebral arteries (PCA): Multifocal moderate-to-severe stenosis of the right P2 segment. Normal left. ANTERIOR CIRCULATION: --Intracranial internal carotid arteries: Normal. --Anterior cerebral arteries (ACA): Normal. Both A1 segments are present. Patent anterior communicating artery (a-comm). --Middle cerebral arteries (MCA): Complete occlusion of the left MCA at the proximal M1  segment with limited collateralization. Right MCA is normal. VENOUS SINUSES: As permitted by contrast timing, patent. ANATOMIC VARIANTS: None Review  of the MIP images confirms the above findings. CT Brain Perfusion Findings: ASPECTS: 9 CBF (<30%) Volume: 53mL Perfusion (Tmax>6.0s) volume: 129mL Mismatch Volume: 134mL, likely slightly overestimated due to small area of completed infarction seen on the noncontrast head CT. Infarction Location:Left MCA territory IMPRESSION: 1. 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 2. Large area of ischemic penumbra in the left MCA territory. 3. 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 Critical Value/emergent results were called by telephone at the time of interpretation on 12/16/2020 at 9:02 pm to provider Christ Kick , who verbally acknowledged these results. Aortic Atherosclerosis (ICD10-I70.0). Electronically Signed   By: Ulyses Jarred M.D.   On: 12/16/2020 21:08   CT ANGIO NECK CODE STROKE  Result Date: 12/16/2020 CLINICAL DATA:  Right-sided deficits EXAM: CT ANGIOGRAPHY HEAD AND NECK CT PERFUSION BRAIN TECHNIQUE: Multidetector CT imaging of the head and neck was performed using the standard protocol during bolus administration of intravenous contrast. Multiplanar CT image reconstructions and MIPs were obtained to evaluate the vascular anatomy. Carotid stenosis measurements (when applicable) are obtained utilizing NASCET criteria, using the distal internal carotid diameter as the denominator. Multiphase CT imaging of the brain was performed following IV bolus contrast injection. Subsequent parametric perfusion maps were calculated using RAPID software. CONTRAST:  144mL OMNIPAQUE IOHEXOL 350 MG/ML SOLN COMPARISON:  None. FINDINGS: CTA NECK FINDINGS SKELETON: There is no bony spinal canal stenosis. No lytic or blastic lesion. OTHER NECK: Normal  pharynx, larynx and major salivary glands. No cervical lymphadenopathy. Unremarkable thyroid gland. UPPER CHEST: No pneumothorax or pleural effusion. No nodules or masses. AORTIC ARCH: There is no calcific atherosclerosis of the aortic arch. There is no aneurysm, dissection or hemodynamically significant stenosis of the visualized portion of the aorta. Conventional 3 vessel aortic branching pattern. The visualized proximal subclavian arteries are widely patent. RIGHT CAROTID SYSTEM: Normal without aneurysm, dissection or stenosis. LEFT CAROTID SYSTEM: Normal without aneurysm, dissection or stenosis. VERTEBRAL ARTERIES: Left dominant configuration. Both origins are clearly patent. There is no dissection, occlusion or flow-limiting stenosis to the skull base (V1-V3 segments). CTA HEAD FINDINGS POSTERIOR CIRCULATION: --Vertebral arteries: Normal V4 segments. --Inferior cerebellar arteries: Normal. --Basilar artery: Normal. --Superior cerebellar arteries: Normal. --Posterior cerebral arteries (PCA): Multifocal moderate-to-severe stenosis of the right P2 segment. Normal left. ANTERIOR CIRCULATION: --Intracranial internal carotid arteries: Normal. --Anterior cerebral arteries (ACA): Normal. Both A1 segments are present. Patent anterior communicating artery (a-comm). --Middle cerebral arteries (MCA): Complete occlusion of the left MCA at the proximal M1 segment with limited collateralization. Right MCA is normal. VENOUS SINUSES: As permitted by contrast timing, patent. ANATOMIC VARIANTS: None Review of the MIP images confirms the above findings. CT Brain Perfusion Findings: ASPECTS: 9 CBF (<30%) Volume: 90mL Perfusion (Tmax>6.0s) volume: 187mL Mismatch Volume: 126mL, likely slightly overestimated due to small area of completed infarction seen on the noncontrast head CT. Infarction Location:Left MCA territory IMPRESSION: 1. Emergent large vessel occlusion of the left middle cerebral artery at the proximal M1 segment with  limited collateralization. 2. Large area of ischemic penumbra in the left MCA territory. 3. 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 Critical Value/emergent results were called by telephone at the time of interpretation on 12/16/2020 at 9:02 pm to provider Christ Kick , who verbally acknowledged these results. Aortic Atherosclerosis (ICD10-I70.0). Electronically Signed   By: Ulyses Jarred M.D.   On: 12/16/2020 21:08    Labs:  Basic  Metabolic Panel: No results for input(s): NA, K, CL, CO2, GLUCOSE, BUN, CREATININE, CALCIUM, MG, PHOS in the last 168 hours.  CBC: No results for input(s): WBC, NEUTROABS, HGB, HCT, MCV, PLT in the last 168 hours.  CBG: Recent Labs  Lab 12/29/20 2116 12/30/20 0632 12/30/20 1207 12/30/20 1212 12/30/20 1329  GLUCAP 190* 72 56* 61* 155*   Family history.  Father with diabetes mellitus hypertension.  Sister with CVA.  Denies any colon cancer esophageal cancer or rectal cancer  Brief HPI:   Melvin Taylor is a 55 y.o. right-handed male with history of hypertension hyperlipidemia CAD with myocardial infarction maintained on Plavix epilepsy/seizures maintained on Depakote as well as Lamictal, diabetes mellitus tobacco abuse as well as polysubstance abuse and questionable medical compliance.  Per chart review lives alone.  1 level home.  Has a PCA 4 hours a day 6 days a week.  Independent with assistive device.  He has a sister in the area.  Presented 12/16/2020 with acute onset of right-sided weakness and speech difficulties.  CT/MRI showed scattered acute infarcts within the left MCA and MCA/PCA watershed territories affecting the cortical subcortical left frontal parietal and occipital lobe.  Multiple acute infarcts also present within the left insula and basal ganglia.  No significant mass-effect.  No hemorrhagic conversion.  MRA demonstrated moderate stenosis within the proximal M1 and right MCA.  Severe stenosis within  the P2 and right PCA.  Patient underwent mechanical thrombectomy per interventional radiology echocardiogram with ejection fraction of 65 to 08% grade 1 diastolic dysfunction.  EEG negative for seizure.  Admission chemistries glucose 175 creatinine 2.15 hemoglobin 12 hemoglobin A1c 9.7 urine drug screen positive marijuana.  Maintained on Brilinta for CVA prophylaxis no aspirin secondary to questionable allergy.  Lovenox for DVT prophylaxis.  Due to patient's decreased functional ability right side weakness and speech difficulties was admitted for a comprehensive rehab program.   Hospital Course: Amel Gianino Wahlert was admitted to rehab 12/21/2020 for inpatient therapies to consist of PT, ST and OT at least three hours five days a week. Past admission physiatrist, therapy team and rehab RN have worked together to provide customized collaborative inpatient rehab.  Pertaining to patient's left MCA due to left M1 occlusion status post stenting remained on Brilinta alone no aspirin due to suspect allergy.  Patient would follow-up neurology services.  Lovenox for DVT prophylaxis.  No bleeding episodes.  Mood stabilization with Lexapro scheduled as well as melatonin as directed.  Patient did have a history of epilepsy/seizures EEG negative Lamictal or Depakote as directed.  His mood was stable followed by neuropsychology.  Lipitor for hyperlipidemia.  Blood pressure currently controlled on present regimen of Norvasc HCTZ and Imdur and would need outpatient follow-up.  Blood sugars monitored hemoglobin A1c 9.7 insulin therapy prior to admission patient follow-up diabetic teaching.  CAD with myocardial infarction patient now on Brilinta no chest pain or shortness of breath.  History of tobacco polysubstance abuse urine drug screen positive marijuana patient did receive counts regards to cessation of nicotine as well as illicit drug use questionable if would be compliant with these request.  AKI on CKD admission chemistries  creatinine 2.15 improved to 1.39 follow-up chemistries.   Blood pressures were monitored on TID basis and soft and monitored  Diabetes has been monitored with ac/hs CBG checks and SSI was use prn for tighter BS control.    Rehab course: During patient's stay in rehab weekly team conferences were held to monitor patient's progress, set  goals and discuss barriers to discharge. At admission, patient required minimal assist to ambulate with rolling walker minimal assist stand pivot transfers minimal guard supine to sit.  Set up upper body bathing minimal assist lower body bathing minimal assist upper body dressing minimal assist lower body dressing  Physical exam.  Blood pressure 153/72 pulse 69 temperature 98.1 respirations 18 oxygen saturations 100% room air Constitutional.  Patient a bit lethargic but arousable HEENT Head.  Normocephalic and atraumatic Eyes.  Pupils round and reactive to light no discharge.nystagmus Neck.  Supple nontender no JVD without thyromegaly Cardiac regular rate rhythm without extra sounds or murmur heard Abdomen.  Soft nontender positive bowel sounds without rebound Respiratory effort normal no respiratory distress without wheeze Skin.  Warm and dry Musculoskeletal.  No swelling or tenderness no edema Neurologic.  Lethargic arousable he does open his eyes follows commands with verbal cues.  Stated he was in [place] he was oriented to name.  Right upper extremity grossly 3/5 right lower extremity 2+ to 3 -/5 with inconsistent effort.  Left upper left lower extremity 3-4/5 with inconsistent effort.  Withdrawal to deep stimuli  He/She  has had improvement in activity tolerance, balance, postural control as well as ability to compensate for deficits. He/She has had improvement in functional use RUE/LUE  and RLE/LLE as well as improvement in awareness.  Sit to stand without assistive device close supervision contact-guard assist for steadying and safety.  Ambulates 150  feet without assistive device contact-guard assist.  He was able to stand from bed with minimal assist and work on picking up his clothing from the [employer].  When presented with items he had chosen he was able to name them when shown to him.  He was able to ambulate to the shower with minimal assist and no device.  He stood to remove clothing at min assist and then transferred over to the shower seat for bathing.  Needed minimal obstructional cues.  Once he dried off he transferred out to edge of bed progressing with moderate instructional cues again to not try and stand to put on his lower body clothing.  From seated position he completed upper body dressing and supervision lower body dressing minimal assist for underpants and pants.  Patient was able to name all common objects 90% accurate independently.  Full teaching completed plan discharge to home       Disposition:  There are no questions and answers to display.         Diet: Diabetic diet  Special Instructions: No driving smoking or alcohol  Medications at discharge. 1.  Tylenol as needed 2.  Norvasc 10 mg p.o. daily 3.  Lipitor 80 mg p.o. nightly 4.  Depakote 1000 mg p.o. every 12 hours 5.  Lexapro 20 mg p.o. daily 6.  HCTZ 25 mg p.o. daily 7.  Lantus insulin 8 units daily 8.  Imdur 30 mg p.o. daily 9.  Lamictal 50 mg p.o. twice daily 10.  Melatonin 10 mg p.o. nightly as needed sleep 11.  Multivitamin daily 12.  Protonix 40 mg p.o. daily 13.  Brilinta 90 mg p.o. twice daily  30-35 minutes were spent completing discharge summary and discharge planning  Discharge Instructions    Ambulatory referral to Neurology   Complete by: As directed    An appointment is requested in approximately 4 weeks left MCA/left M1 occlusion   Ambulatory referral to Physical Medicine Rehab   Complete by: As directed    Moderate complexity follow-up 1 to  2 weeks left MCA/left M1 occlusion       Follow-up Information    Kirsteins, Luanna Salk, MD Follow up.   Specialty: Physical Medicine and Rehabilitation Why: Office to call for appointment Contact information: [place] [address] [phone]        [name], FNP Follow up.   Specialty: Family Medicine Why: call for post hospital follow up Contact information: [place] [address] [phone]        [facility] Follow up.   Specialty: Neurology Why: for appt if you have not heard from them by Monday Contact information: [address] [place] [phone]              Signed: Cathlyn Parsons 12/30/2020, 5:08 PM

## 2020-12-28 NOTE — Progress Notes (Signed)
Hypoglycemic Event  CBG: 50  Treatment: 8 oz juice/soda  Symptoms: None  Follow-up CBG: Time:0732 CBG Result:84  Possible Reasons for Event: Unknown  Comments/MD notified:Secure chat sent to MD Kirsteins and Silvestre Mesi PA. Chapman Fitch LPN to follow up for day shift.     Athelene Hursey, Warnell Bureau

## 2020-12-28 NOTE — Progress Notes (Signed)
Physical Therapy Session Note  Patient Details  Name: Melvin Taylor MRN: [id number] Date of Birth: July 14, 1966  Today's Date: 12/28/2020 PT Individual Time: 1400-1500 PT Individual Time Calculation (min): 60 min   Short Term Goals: Week 1:  PT Short Term Goal 1 (Week 1): STG= LTG based on ELOS  Skilled Therapeutic Interventions/Progress Updates:  Pt seated in recliner.  He denied pain.  PT oriented pt to location and date.  With choice of 2, he was able to state situation.  Sit> stand from recliner with CGA.  In standing, therapeutic activity requiring bil UEs and R attention, retrieving 4 towels from chair on R and folding indiviually on rolling table in front of him; rest, folded 6 wash cloths and 1 pillow case.  Pt spontaneously used R hand but needed min cues for R attention at times.  He retrieved 2 wash cloths that he dropped on the floor using R hand, with L hand support on table. 10 minutes total in standing during bil UE activity.  Recliner> floor mat with CGA and cues for positioning body.  CGA and mod cues for technique floor> sitting.   Gait training without AD x 100' with CGA/min assist for rare LOB.  Pt demonstrated excessive bil hip external rotation, but no hyperextension R knee.  The bil hip external rotation may be habit.   neuromuscular re-education via demo, multimodal cues for standing with bil UE support --10 x 1 each:  bil heel/toe raises with 2 second holds, R/L hip abduction focused on neutral hip rotation; 5 x 1 mini squats.  At end of session, pt seated in recliner with needs at hand and seat pad alarm set.     Therapy Documentation Precautions:  Precautions Precautions: Fall Precaution Comments: R side inattention Restrictions Weight Bearing Restrictions: No      Therapy/Group: Individual Therapy  Rakayla Ricklefs 12/28/2020, 4:14 PM

## 2020-12-28 NOTE — Progress Notes (Signed)
Hypoglycemic Event  CBG: 56  Treatment: 4 oz juice/soda- with breakfast.   Symptoms: None  Follow-up CBG: [id number] CBG Result:50  Possible Reasons for Event: Unknown  Comments/MD notified Secure chat sent to MD Kirsteins. Patient still eating breakfast    Leda Belle[place]uille, Warnell Bureau

## 2020-12-28 NOTE — Progress Notes (Signed)
Occupational Therapy Session Note  Patient Details  Name: Melvin Taylor MRN: [id number] Date of Birth: [birth year]  Today's Date: 12/28/2020 OT Individual Time: 2330-0762 OT Individual Time Calculation (min): 40 min    Short Term Goals: Week 1:  OT Short Term Goal 1 (Week 1): STGs equal to LTGs set at supervision to modified independent level.  Skilled Therapeutic Interventions/Progress Updates:    Pt in bed to start session, easily awoken for therapy.  He was able to transfer to the EOB with supervision to work on dressing tasks.  Setup for pullover shirt with min guard for underpants and shorts.  He was able to donn his TEDs and gripper socks as well with setup assist.  He then completed abrupt transfer to the bathroom at min assist level without use of an assistive device, where he completed urination at min guard assist sit to stand.  He then ambulated to the sink for washing his hands at min assist level with completion of grooming tasks at min guard assist in static standing.  Next, had him ambulate up to the therapy gym at min assist level.  Noted LOB X2 without an assistive device when answering people in the hallway during mobility.  Once in the gym had him complete Nine Hole Peg Test.  She was able to complete this with the LUE in 48 secs and then the RUE in 95 seconds demonstrating coordination impairment.  Finished session with ambulation back to the room at min assist to min guard where he was left sitting up in the recliner with the call button and phone in reach.  NT in room as well.    Therapy Documentation Precautions:  Precautions Precautions: Fall Precaution Comments: R side inattention Restrictions Weight Bearing Restrictions: No  Pain: Pain Assessment Pain Scale: Faces Pain Score: 0-No pain ADL: See Care Tool Section for some details of mobility and selfcare  Therapy/Group: Individual Therapy  Olis Viverette OTR/L 12/28/2020, 12:26 PM

## 2020-12-28 NOTE — Progress Notes (Signed)
Speech Language Pathology Weekly Progress and Session Note  Patient Details  Name: Melvin Taylor MRN: [id number] Date of Birth: 02-18-1966  Beginning of progress report period: Dec 21, 2020 End of progress report period: Dec 28, 2020  Today's Date: 12/28/2020 SLP Individual Time: 1300-1345 SLP Individual Time Calculation (min): 45 min  Short Term Goals: Week 1: SLP Short Term Goal 1 (Week 1): STG=LTG due to short ELOS    New Short Term Goals: Week 2: SLP Short Term Goal 1 (Week 2): STGs=LTGs due to ELOS  Weekly Progress Updates: Pt has met  2/3 LTGs this reporting period due to improvmeent in overall verbal expression. He requires min A for verbal expression during structured tasks. He demonstrates improvement in naming, reading at sentence level, and sentence formulation at conversational level. Pt education is ongoing. He will benefit from continued skilled SLP intervention to address verbal expression and cognition.      Intensity: Minumum of 1-2 x/day, 30 to 90 minutes Frequency: 3 to 5 out of 7 days Duration/Length of Stay: 7-10 days Treatment/Interventions: Cognitive remediation/compensation;Cueing hierarchy;Environmental controls;Internal/external aids;Functional tasks;Patient/family education;Speech/Language facilitation   Daily Session  Skilled Therapeutic Interventions: Skilled SLP intervention focused on verbal expression. Pt able to name 8/10 objects. Errors were semantic paraphasias (I.e. paper towel for washcloth and tooth brush for hair brush) mod A for awareness of errors in naming. Pt became tearful during session and put head down. He stated "Im just not feeling good about things" Pt unable to explain what information was shared with him from a friend that made him emotional. Pt able to converse with family members on phone effectively when given increased time to communicate thoughts and answer questions. Pt left seated upright in chair with chair alarm set and  call button within reach. Cont with therapy per plan of care.    General    Pain Pain Assessment Pain Scale: Faces Pain Score: 0-No pain Faces Pain Scale: No hurt  Therapy/Group: Individual Therapy  Darrol Poke Tiffanyann Deroo 12/28/2020, 1:41 PM

## 2020-12-28 NOTE — Progress Notes (Signed)
Physical Therapy Session Note  Patient Details  Name: Melvin Taylor MRN: [id number] Date of Birth: 1965/09/11  Today's Date: 12/28/2020 PT Individual Time: 1110-1203  PT Individual Time Calculation (min): 53 min   Short Term Goals: Week 1:  PT Short Term Goal 1 (Week 1): STG= LTG based on ELOS  Skilled Therapeutic Interventions/Progress Updates:    Pt received asleep, sitting in recliner. Easily awakens and is agreeable to therapy session. Therapist educated pt on need to use RW for all standing/ambulating at home to improve balance and decrease fall risk due to pt's frequent unsteadiness with minor LOB - pt in agreement with this plan. Sit<>stands using RW with close supervision/intermittent CGA throughout session. Gait training ~165ft to main therapy gym using RW with close supervision/CGA for safety and therapist intermittently cuing/faciltiating for improved upright posture as pt tends to flex forward - pt noted to intermittently kick R side of RW with R foot due to excessive circumduction with decreased hip/knee flexion. Therapist increased height of RW by 1 to help improve upright posture. Stair navigation training 12steps using B HRs with cuing for reciprocal pattern on ascent and descent for R LE NMR and CGA for steadying. Gait training ~163ft to ADL apartment using RW with [place] for steadying. Gait training in ADL apartment using RW with education/training on safe AD management while navigating around cabinets, refrigerator, and how to slide a plate along the counter to allow safe B support on RW for proper AD management - educated pt to use walker bag to carry items while ambulating so that both hands can be safely on AD. Furniture transfer to/from couch using RW with close supervision for safety. Gait training ~143ft back towards room using RW while working on R/L visual scanning on verbal command for balance challenge - continued cuing/facilitation as above. At end of session pt left sitting  in recliner with needs in reach and chair alarm on.    Therapy Documentation Precautions:  Precautions Precautions: Fall Precaution Comments: R side inattention Restrictions Weight Bearing Restrictions: No  Pain: No reports of pain throughout session.   Therapy/Group: Individual Therapy  Tawana Scale , PT, DPT, CSRS  12/28/2020, 12:28 PM

## 2020-12-28 NOTE — Progress Notes (Addendum)
Occupational Therapy Discharge Summary  Patient Details  Name: Melvin Taylor MRN: [id number] Date of Birth: 09-14-1965  Today's Date: 12/28/2020      Patient has met 3 of 14 long term goals due to improved activity tolerance, improved balance, postural control, ability to compensate for deficits, functional use of  RIGHT upper extremity, improved attention, improved awareness and improved coordination.  Patient to discharge at overall Supervision level.  Patient's care partner requires assistance to provide the necessary physical and cognitive assistance at discharge.    Reasons goals not met: Pt requires supervision/cuing for safety awareness and CGA during most standing tasks during ADLs 2/2 balance deficits.  Recommendation:  Patient will benefit from ongoing skilled OT services in home health setting to continue to advance functional skills in the area of BADL and Reduce care partner burden.  Pt will continue to benefit from continued HHOT to work on increasing overall independence with selfcare tasks.  Currently, he still needs supervision overall secondary to decreased dynamic standing balance as well as decreased safety, memory, and decreased problem solving.  No family attended formal education but according to SW pt will have 4 hrs a day from a scheduled caregiver with all other hours completed by his aunt and his sister.  He will need initial 24 hour supervision for safety.   Equipment: tub bench  Reasons for discharge: treatment goals met and discharge from hospital  Patient/family agrees with progress made and goals achieved: Yes  OT Discharge Precautions/Restrictions  Precautions Precautions: Fall Precaution Comments: R side inattention Restrictions Weight Bearing Restrictions: No    Pain  none ADL ADL Eating: Independent Where Assessed-Eating: Chair Grooming: Supervision/safety Where Assessed-Grooming: Standing at sink Upper Body Bathing: Setup Where  Assessed-Upper Body Bathing: Shower,Chair Lower Body Bathing: Supervision/safety Where Assessed-Lower Body Bathing: Chair,Shower Upper Body Dressing: Supervision/safety Where Assessed-Upper Body Dressing: Edge of bed Lower Body Dressing: Supervision/safety Where Assessed-Lower Body Dressing: Edge of bed Toileting: Supervision/safety Where Assessed-Toileting: Glass blower/designer: Close supervision Armed forces technical officer Method: Counselling psychologist: Raised toilet seat Tub/Shower Transfer: Close supervison Clinical cytogeneticist Method: Optometrist: Facilities manager: Close supervision Social research officer, government Method: Ambulating Vision Baseline Vision/History: Wears glasses Wears Glasses: At all times Patient Visual Report: No change from baseline Vision Assessment?: Yes Eye Alignment: Within Functional Limits Ocular Range of Motion: Within Functional Limits Alignment/Gaze Preference: Within Defined Limits Tracking/Visual Pursuits: Decreased smoothness of horizontal tracking;Decreased smoothness of vertical tracking Convergence: Within functional limits Visual Fields: Right visual field deficit Perception  Perception: Impaired Inattention/Neglect: Does not attend to right visual field Praxis Praxis: Impaired Praxis Impairment Details: Ideation Cognition Overall Cognitive Status: Impaired/Different from baseline Arousal/Alertness: Awake/alert Attention: Sustained Sustained Attention: Impaired Sustained Attention Impairment: Verbal basic;Functional basic Memory: Impaired Memory Impairment: Storage deficit;Decreased short term memory Awareness: Impaired Awareness Impairment: Anticipatory impairment;Emergent impairment Problem Solving: Impaired Problem Solving Impairment: Functional basic;Verbal basic Safety/Judgment: Impaired Sensation Sensation Light Touch: Appears Intact Hot/Cold: Appears Intact Proprioception:  Appears Intact Stereognosis: Not tested Additional Comments: Sensation intact in BUEs per gross testing Coordination Gross Motor Movements are Fluid and Coordinated: No Fine Motor Movements are Fluid and Coordinated: No Coordination and Movement Description: Pt currently uses the RUE at a non-dominant level with selfcare tasks.  Slight decreased coordination and speed noted when compared with the LUE. Motor  Motor Motor: Hemiplegia Motor - Discharge Observations: Slight right hemiparesis still present Mobility  Bed Mobility Bed Mobility: Supine to Sit;Sit to Supine Supine to Sit: Supervision/Verbal cueing Sit to Supine:  Supervision/Verbal cueing Transfers Sit to Stand: Supervision/Verbal cueing Stand to Sit: Supervision/Verbal cueing  Trunk/Postural Assessment  Cervical Assessment Cervical Assessment: Within Functional Limits Thoracic Assessment Thoracic Assessment: Exceptions to [facility] (slight thoracic rounding) Lumbar Assessment Lumbar Assessment: Exceptions to [facility] (slight posterior pelvic tilt)  Balance Balance Balance Assessed: Yes Static Sitting Balance Static Sitting - Balance Support: Feet supported Static Sitting - Level of Assistance: 7: Independent Dynamic Sitting Balance Dynamic Sitting - Balance Support: During functional activity Dynamic Sitting - Level of Assistance: 7: Independent Static Standing Balance Static Standing - Balance Support: During functional activity Static Standing - Level of Assistance: 7: Independent Dynamic Standing Balance Dynamic Standing - Balance Support: During functional activity;Bilateral upper extremity supported Dynamic Standing - Level of Assistance: 5: Stand by assistance Extremity/Trunk Assessment RUE Assessment RUE Assessment: Exceptions to [facility] Passive Range of Motion (PROM) Comments: WFLs Active Range of Motion (AROM) Comments: WFLs General Strength Comments: Strength 4/5 throughout, still with decreased FM coordination noted  during 9 hole peg test which was completed in 95 seconds compared to 48 on the left. LUE Assessment LUE Assessment: Within Functional Limits   MCGUIRE,JAMES 12/28/2020, 9:15 PM

## 2020-12-28 NOTE — Progress Notes (Signed)
PROGRESS NOTE   Subjective/Complaints: Pt up in bed. Eating breakfast. Had no complaints.   ROS: Patient denies CP, SOB, N/V/D   Objective:   No results found. No results for input(s): WBC, HGB, HCT, PLT in the last 72 hours. No results for input(s): NA, K, CL, CO2, GLUCOSE, BUN, CREATININE, CALCIUM in the last 72 hours.  Intake/Output Summary (Last 24 hours) at 12/28/2020 0950 Last data filed at 12/28/2020 0715 Gross per 24 hour  Intake 592 ml  Output --  Net 592 ml        Physical Exam: Vital Signs Blood pressure 127/75, pulse (!) 52, temperature 97.6 F (36.4 C), resp. rate 18, SpO2 100 %.  General: No acute distress Mood and affect are appropriate Heart: Regular rate and rhythm no rubs murmurs or extra sounds Lungs: Clear to auscultation, breathing unlabored, no rales or wheezes Abdomen: Positive bowel sounds, soft nontender to palpation, nondistended Extremities: No clubbing, cyanosis, or edema  Skin: intact  Neuro: Cranial nerves II through XII intact, motor strength is 5/5 in Left , 4/5 Right  deltoid, bicep, tricep, grip, hip flexor, knee extensors, ankle dorsiflexor and plantar flexor Normal sensory and cerebellar exam Musculoskeletal: Full range of motion in all 4 extremities. No joint swelling   Assessment/Plan: 1. Functional deficits which require 3+ hours per day of interdisciplinary therapy in a comprehensive inpatient rehab setting.  Physiatrist is providing close team supervision and 24 hour management of active medical problems listed below.  Physiatrist and rehab team continue to assess barriers to discharge/monitor patient progress toward functional and medical goals  Care Tool:  Bathing    Body parts bathed by patient: Right arm,Left arm,Chest,Abdomen,Front perineal area,Buttocks,Right upper leg,Left upper leg,Right lower leg,Left lower leg,Face         Bathing assist Assist Level:  Minimal Assistance - Patient > 75%     Upper Body Dressing/Undressing Upper body dressing   What is the patient wearing?: Pull over shirt    Upper body assist Assist Level: Supervision/Verbal cueing    Lower Body Dressing/Undressing Lower body dressing      What is the patient wearing?: Pants,Underwear/pull up     Lower body assist Assist for lower body dressing: Contact Guard/Touching assist     Toileting Toileting    Toileting assist Assist for toileting: Minimal Assistance - Patient > 75%     Transfers Chair/bed transfer  Transfers assist     Chair/bed transfer assist level: Contact Guard/Touching assist     Locomotion Ambulation   Ambulation assist      Assist level: Minimal Assistance - Patient > 75% Assistive device: No Device Max distance: 165ft   Walk 10 feet activity   Assist     Assist level: Minimal Assistance - Patient > 75% Assistive device: No Device   Walk 50 feet activity   Assist    Assist level: Minimal Assistance - Patient > 75% Assistive device: No Device    Walk 150 feet activity   Assist Walk 150 feet activity did not occur: Safety/medical concerns  Assist level: Minimal Assistance - Patient > 75% Assistive device: No Device    Walk 10 feet on uneven surface  activity  Assist     Assist level: Minimal Assistance - Patient > 75% Assistive device: Hand held assist   Wheelchair     Assist Will patient use wheelchair at discharge?: No             Wheelchair 50 feet with 2 turns activity    Assist            Wheelchair 150 feet activity     Assist          Blood pressure 127/75, pulse (!) 52, temperature 97.6 F (36.4 C), resp. rate 18, SpO2 100 %.  Medical Problem List and Plan: 1.  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  Antithrombotics: -DVT/anticoagulation: Lovenox             -antiplatelet therapy: Brilinta 90 mg twice daily 3. Pain Management: Continue Tylenol as needed 4. Mood: Continue Lexapro 20 mg daily, melatonin 10 mg nightly as needed             -antipsychotic agents: N/A 5. Neuropsych: This patient is not capable of making decisions on his own behalf. 6. Skin/Wound Care: Routine skin checks 7. Fluids/Electrolytes/Nutrition: Routine in and outs with follow-up chemistries 8.  Epilepsy/seizures, Lamictal 50 mg twice daily, Depakote 1000 mg every 12 hours.  EEG negative for seizure 9.  Hyperlipidemia.  Lipitor 10.  Hypertension.  Continue Norvasc 10 mg daily, HCTZ 25 mg daily Imdur 30 mg daily.  Monitor with increased mobility Vitals:   12/27/20 1941 12/28/20 0459  BP: 114/76 127/75  Pulse: 66 (!) 52  Resp: 16 18  Temp: 98.3 F (36.8 C) 97.6 F (36.4 C)  SpO2: 99% 100%  controlled 5/30 11.  Diabetes mellitus.  Hemoglobin A1c 9.7.  SSI.  Patient on Lantus 40 units nightly prior to admission.  Resume as needed            CBG (last 3)  Recent Labs    12/28/20 0631 12/28/20 0658 12/28/20 0732  GLUCAP 56* 50* 84  labile 5/28---resume lantus at 8u qam hypoglycemic- hold today , resume 5U in am , will need to determine whether pt will be going home on Lantus  12.  CAD with myocardial infarction.  Patient now on Brilinta 13.  History of tobacco polysubstance abuse.  Urine drug screen positive marijuana.  Provide counseling 14.  AKI on CKD admission creatinine 2.15 improved to 1.39.  Follow-up chemistries             -encourage adequate intake             -wasn't motivated to eat much while I was in room today. 15.  Questionable medical noncompliance.  Provide counseling 16. Lethargy: more alert  17.  Pancytopenia - mild - will consult pharmacy to see which meds are most likely involved   LOS: 7 days A FACE TO [place] 12/28/2020, 9:50 AM

## 2020-12-28 NOTE — Progress Notes (Signed)
Patient ID: Melvin Taylor, male   DOB: [birth year], 55 y.o.   MRN: [id number] Spoke with Lisa-sister via telephone who reports the caregiver has fallen through and she is tryng to see if another family member can take pt home and provide care. She reports their Dad who lives in [place] may be able too. But will know more tomorrow. She does not want him to go to a NH but he may have too for a little while. Aware Christina-BSW will be back tomorrow and wants her to all her regarding plans.

## 2020-12-29 LAB — GLUCOSE, CAPILLARY
Glucose-Capillary: 88 mg/dL (ref 70–99)
Glucose-Capillary: 140 mg/dL — ABNORMAL HIGH (ref 70–99)
Glucose-Capillary: 251 mg/dL — ABNORMAL HIGH (ref 70–99)
Glucose-Capillary: 190 mg/dL — ABNORMAL HIGH (ref 70–99)

## 2020-12-29 NOTE — Progress Notes (Signed)
Physical Therapy Session Note  Patient Details  Name: Melvin Taylor MRN: [id number] Date of Birth: 1966-05-20  Today's Date: 12/29/2020 PT Individual Time: 1751-0258 PT Individual Time Calculation (min): 70 min   Short Term Goals: Week 1:  PT Short Term Goal 1 (Week 1): STG= LTG based on ELOS  Skilled Therapeutic Interventions/Progress Updates:    Pt received asleep L sidelying in bed and upon awakening agreeable to therapy session. Pt continues to be disoriented to location and situation stating he doesn't know where he is at and reports he is here due to "seizures." Therapist reoriented patient but with poor carryover. Supine<>sit during session with supervision. Sit<>stands using RW with close supervision for safety during session - cuing for safe hand placement with AD. Gait training ~1100ft to ortho gym using RW with close supervision - pt continues to intermittently hit R foot on back leg of RW during swing phase due to excessive circumduction though improved from yesterday - continued cuing to maintain upright posture and proximity to RW. Simulated car transfer (sedan height) using RW with CGA for steadying as pt demos poor safety awareness placing AD off to side when entering car and then leaving 1 foot in vehicle while coming to stand as exiting car - therapist educated pt on proper AD management and sequencing of transfer to increase safety and decrease risk for falls with having pt demonstrate understanding though pt resistant to this education at this time. Gait training ~59ft up/down ramp using RW with close supervision. Gait training ~110ft to main therapy gym with continued cuing for improved gait mechanics and AD management. Stair navigation training ascending/descending 12 steps using B HRs with close supervision/CGA for steadying/safety - pt demos improved R UE attention on HR only requiring 1x cuing to ensure safe placement. Patient participated in [facility] and demonstrates  increased fall risk as noted by score of   31/56.  (<36= high risk for falls, close to 100%; 37-45 significant >80%; 46-51 moderate >50%; 52-55 lower >25%). Educated pt on results of test and increased fall risk. Gait training ~154ft back to room using RW with close supervision. Therapist reinforcing proper AD management and sequencing when turning to sit down as pt frequently tries to place RW off to the side when going to sit. Pt left supine in bed with needs in reach and bed alarm on.  Therapy Documentation Precautions:  Precautions Precautions: Fall,Other (comment) Precaution Comments: R side inattention Restrictions Weight Bearing Restrictions: No  Pain:   Denies pain during session.  Balance Balance Balance Assessed: Yes Standardized Balance Assessment Standardized Balance Assessment: Berg Balance Test Berg Balance Test Sit to Stand: Able to stand  independently using hands Standing Unsupported: Able to stand 2 minutes with supervision Sitting with Back Unsupported but Feet Supported on Floor or Stool: Able to sit safely and securely 2 minutes Stand to Sit: Sits safely with minimal use of hands Transfers: Able to transfer with verbal cueing and /or supervision Standing Unsupported with Eyes Closed: Able to stand 10 seconds with supervision Standing Ubsupported with Feet Together: Able to place feet together independently but unable to hold for 30 seconds (had posterior LOB at 45seconds) From Standing, Reach Forward with Outstretched Arm: Can reach forward >5 cm safely (2") From Standing Position, Pick up Object from Floor: Able to pick up shoe, needs supervision From Standing Position, Turn to Look Behind Over each Shoulder: Needs supervision when turning Turn 360 Degrees: Needs close supervision or verbal cueing Standing Unsupported, Alternately Place  Feet on Step/Stool: Able to complete >2 steps/needs minimal assist (requires CGA) Standing Unsupported, One Foot in Front: Able  to take small step independently and hold 30 seconds Standing on One Leg: Unable to try or needs assist to prevent fall (requires min assist to maintain upright) Total Score: 31   Therapy/Group: Individual Therapy  Tawana Scale , PT, DPT, CSRS  12/29/2020, 8:07 PM

## 2020-12-29 NOTE — Progress Notes (Signed)
Speech Language Pathology Daily Session Note  Patient Details  Name: Melvin Taylor MRN: [id number] Date of Birth: 02/24/1966  Today's Date: 12/29/2020 SLP Individual Time: 1834-3735 SLP Individual Time Calculation (min): 42 min  Short Term Goals: Week 2: SLP Short Term Goal 1 (Week 2): STGs=LTGs due to ELOS  Skilled Therapeutic Interventions:Skilled ST services focused on education and language skills. SLP communicated with pt's sister via phone, she supports not being able to provided 24 hour supervision and are looking to possibly father to provide supervision or SNF placement. SLP provided education on current langauge and cognitive impairments and the need for 24 hour supervision. SLP facilitated identification of objects in a field of 3, pt demonstrated 90% accuracy with 20% sentence completion and function demonstration cues. Pt named function of common objects in 5 out 10 opportunities with max A sentence completion and demonstration cues. Pt's participate was limited due to observed fatigue and reduced sustained attention. Pt was left in room with call bell within reach and bed alarm set. SLP recommends to continue skilled services.     Pain Pain Assessment Pain Score: 0-No pain  Therapy/Group: Individual Therapy  Solita Macadam  [facility] [address] 12/29/2020, 11:44 AM

## 2020-12-29 NOTE — Progress Notes (Signed)
Physical Therapy Discharge Summary  Patient Details  Name: Melvin Taylor MRN: [id number] Date of Birth: [birth year]  Patient has met 10 of 10 long term goals due to improved activity tolerance, improved balance, improved postural control, increased strength, ability to compensate for deficits, functional use of  right upper extremity and right lower extremity, improved attention and improved awareness.  Patient to discharge at an ambulatory level using RW with Supervision.   Patient's care partner is independent to provide the necessary physical and cognitive assistance at discharge.  All goals met.  Recommendation:  Patient will benefit from ongoing skilled PT services in home health setting to continue to advance safe functional mobility, address ongoing impairments in dynamic standing balance, endurance, gait training with LRAD, dual-task training, and minimize fall risk.  Equipment: RW  Reasons for discharge: treatment goals met and discharge from hospital  Patient/family agrees with progress made and goals achieved: Yes  PT Discharge Precautions/Restrictions Precautions Precautions: Fall;Other (comment) Precaution Comments: R side inattention Restrictions Weight Bearing Restrictions: No Pain Pain Assessment Pain Scale: 0-10 Pain Score: 0-No pain Perception  Perception Perception: Impaired Inattention/Neglect: Does not attend to right visual field Praxis Praxis: Impaired Praxis Impairment Details: Ideation;Motor planning  Cognition Overall Cognitive Status: Impaired/Different from baseline Arousal/Alertness: Awake/alert Orientation Level: Oriented to person;Disoriented to place;Disoriented to situation;Disoriented to time (states he had a "siezure" and despite increased time and multiple questioning pt states he doesn't know where he is at) Attention: Focused Focused Attention: Impaired Sustained Attention: Impaired Memory: Impaired Awareness: Impaired Problem  Solving: Impaired Safety/Judgment: Impaired Sensation Sensation Light Touch: Impaired Detail Peripheral sensation comments: decreased distal sensation in B LEs Light Touch Impaired Details: Impaired RLE;Impaired LLE Hot/Cold: Not tested Proprioception: Appears Intact Stereognosis: Not tested Coordination Gross Motor Movements are Fluid and Coordinated: No Coordination and Movement Description: continues to demo impaired B LE coordination during functional mobility causing increased postural sway and minor LOBs Motor  Motor Motor: Hemiplegia Motor - Discharge Observations: Slight right hemiparesis still present  Mobility Bed Mobility Bed Mobility: Supine to Sit;Sit to Supine Supine to Sit: Supervision/Verbal cueing Sit to Supine: Supervision/Verbal cueing Transfers Transfers: Sit to Stand;Stand to Sit;Stand Pivot Transfers Sit to Stand: Supervision/Verbal cueing Stand to Sit: Supervision/Verbal cueing Stand Pivot Transfers: Supervision/Verbal cueing Stand Pivot Transfer Details: Verbal cues for sequencing;Verbal cues for technique;Verbal cues for safe use of DME/AE Transfer (Assistive device): Rolling walker Locomotion  Gait Ambulation: Yes Gait Assistance: Supervision/Verbal cueing Gait Distance (Feet): 200 Feet Assistive device: Rolling walker Gait Assistance Details: Verbal cues for safe use of DME/AE;Verbal cues for technique;Verbal cues for gait pattern Gait Gait: Yes Gait Pattern: Impaired Gait Pattern: Step-through pattern;Poor foot clearance - right;Narrow base of support;Decreased step length - left;Decreased step length - right Gait velocity: Decreased Stairs / Additional Locomotion Stairs: Yes Stairs Assistance: Supervision/Verbal cueing Stair Management Technique: Two rails Number of Stairs: 12 Height of Stairs: 6 Ramp: Supervision/Verbal cueing Wheelchair Mobility Wheelchair Mobility: No  Trunk/Postural Assessment  Cervical Assessment Cervical  Assessment: Exceptions to [facility] (forward head) Thoracic Assessment Thoracic Assessment: Exceptions to [facility] (slight thoracic rounding) Lumbar Assessment Lumbar Assessment: Exceptions to [facility] (slight posterior pelvic tilt) Postural Control Postural Control: Within Functional Limits (continues to have minor LOBs during gait but is able to implement proper stepping balance strategy without hands-on assistance to recover)  Balance Balance Balance Assessed: Yes Standardized Balance Assessment Standardized Balance Assessment: Berg Balance Test Berg Balance Test Sit to Stand: Able to stand  independently using hands Standing Unsupported: Able to stand 2  minutes with supervision Sitting with Back Unsupported but Feet Supported on Floor or Stool: Able to sit safely and securely 2 minutes Stand to Sit: Sits safely with minimal use of hands Transfers: Able to transfer with verbal cueing and /or supervision Standing Unsupported with Eyes Closed: Able to stand 10 seconds with supervision Standing Ubsupported with Feet Together: Able to place feet together independently but unable to hold for 30 seconds (had posterior LOB at 45seconds) From Standing, Reach Forward with Outstretched Arm: Can reach forward >5 cm safely (2") From Standing Position, Pick up Object from Floor: Able to pick up shoe, needs supervision From Standing Position, Turn to Look Behind Over each Shoulder: Needs supervision when turning Turn 360 Degrees: Needs close supervision or verbal cueing Standing Unsupported, Alternately Place Feet on Step/Stool: Able to complete >2 steps/needs minimal assist (requires CGA) Standing Unsupported, One Foot in Front: Able to take small step independently and hold 30 seconds Standing on One Leg: Unable to try or needs assist to prevent fall (requires min assist to maintain upright) Total Score: 31 Static Sitting Balance Static Sitting - Balance Support: Feet supported Static Sitting - Level of  Assistance: 7: Independent Dynamic Sitting Balance Dynamic Sitting - Balance Support: During functional activity Dynamic Sitting - Level of Assistance: 7: Independent Static Standing Balance Static Standing - Balance Support: During functional activity Static Standing - Level of Assistance: 5: Stand by assistance Dynamic Standing Balance Dynamic Standing - Balance Support: During functional activity;Bilateral upper extremity supported Dynamic Standing - Level of Assistance: 5: Stand by assistance Extremity Assessment      RLE Assessment RLE Assessment: Exceptions to [facility] Active Range of Motion (AROM) Comments: WFL General Strength Comments: continues to have some difficulty following commands for MMT but with therapist initiating movement pt able to implement better RLE Strength Right Hip Flexion: 4-/5 Right Hip ABduction: 4-/5 Right Hip ADduction: 4-/5 Right Knee Flexion: 4/5 Right Knee Extension: 4/5 Right Ankle Dorsiflexion: 4-/5 Right Ankle Plantar Flexion: 4-/5 LLE Assessment LLE Assessment: Exceptions to [facility] Active Range of Motion (AROM) Comments: WFL General Strength Comments: continues to have some difficulty following commands for MMT but with therapist initiating movement pt able to implement better LLE Strength Left Hip Flexion: 4/5 Left Hip ABduction: 4/5 Left Hip ADduction: 4/5 Left Knee Flexion: 4/5 Left Knee Extension: 4/5 Left Ankle Dorsiflexion: 4/5 Left Ankle Plantar Flexion: 4/5    Payal Stanforth M Arraya Buck , PT, DPT, CSRS 12/29/2020, 12:55 PM

## 2020-12-29 NOTE — Progress Notes (Signed)
PROGRESS NOTE   Subjective/Complaints:  No issues overnite  ROS: Patient denies CP, SOB, N/V/D   Objective:   No results found. No results for input(s): WBC, HGB, HCT, PLT in the last 72 hours. No results for input(s): NA, K, CL, CO2, GLUCOSE, BUN, CREATININE, CALCIUM in the last 72 hours.  Intake/Output Summary (Last 24 hours) at 12/29/2020 0750 Last data filed at 12/28/2020 1730 Gross per 24 hour  Intake 277 ml  Output --  Net 277 ml        Physical Exam: Vital Signs Blood pressure 109/74, pulse (!) 56, temperature 97.9 F (36.6 C), resp. rate 18, SpO2 100 %.  General: No acute distress Mood and affect are appropriate Heart: Regular rate and rhythm no rubs murmurs or extra sounds Lungs: Clear to auscultation, breathing unlabored, no rales or wheezes Abdomen: Positive bowel sounds, soft nontender to palpation, nondistended Extremities: No clubbing, cyanosis, or edema  Skin: intact  Neuro: Cranial nerves II through XII intact, motor strength is 5/5 in Left , 4/5 Right  deltoid, bicep, tricep, grip, hip flexor, knee extensors, ankle dorsiflexor and plantar flexor Normal sensory and cerebellar exam Musculoskeletal: Full range of motion in all 4 extremities. No joint swelling   Assessment/Plan: 1. Functional deficits which require 3+ hours per day of interdisciplinary therapy in a comprehensive inpatient rehab setting.  Physiatrist is providing close team supervision and 24 hour management of active medical problems listed below.  Physiatrist and rehab team continue to assess barriers to discharge/monitor patient progress toward functional and medical goals  Care Tool:  Bathing    Body parts bathed by patient: Right arm,Left arm,Chest,Abdomen,Front perineal area,Buttocks,Right upper leg,Left upper leg,Right lower leg,Left lower leg,Face         Bathing assist Assist Level: Minimal Assistance - Patient >  75%     Upper Body Dressing/Undressing Upper body dressing   What is the patient wearing?: Pull over shirt    Upper body assist Assist Level: Supervision/Verbal cueing    Lower Body Dressing/Undressing Lower body dressing      What is the patient wearing?: Pants,Underwear/pull up     Lower body assist Assist for lower body dressing: Contact Guard/Touching assist     Toileting Toileting    Toileting assist Assist for toileting: Contact Guard/Touching assist     Transfers Chair/bed transfer  Transfers assist     Chair/bed transfer assist level: Supervision/Verbal cueing Chair/bed transfer assistive device: Programmer, multimedia   Ambulation assist      Assist level: Minimal Assistance - Patient > 75% Assistive device: No Device Max distance: 100   Walk 10 feet activity   Assist     Assist level: Minimal Assistance - Patient > 75% Assistive device: No Device   Walk 50 feet activity   Assist    Assist level: Minimal Assistance - Patient > 75% Assistive device: No Device    Walk 150 feet activity   Assist Walk 150 feet activity did not occur: Safety/medical concerns  Assist level: Contact Guard/Touching assist Assistive device: Walker-rolling    Walk 10 feet on uneven surface  activity   Assist     Assist level: Minimal Assistance -  Patient > 75% Assistive device: Hand held assist   Wheelchair     Assist Will patient use wheelchair at discharge?: No             Wheelchair 50 feet with 2 turns activity    Assist            Wheelchair 150 feet activity     Assist          Blood pressure 109/74, pulse (!) 56, temperature 97.9 F (36.6 C), resp. rate 18, SpO2 100 %.  Medical Problem List and Plan: 1.  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  Antithrombotics: -DVT/anticoagulation: Lovenox             -antiplatelet therapy: Brilinta 90 mg twice daily 3. Pain Management: Continue Tylenol as needed 4. Mood: Continue Lexapro 20 mg daily, melatonin 10 mg nightly as needed             -antipsychotic agents: N/A 5. Neuropsych: This patient is not capable of making decisions on his own behalf. 6. Skin/Wound Care: Routine skin checks 7. Fluids/Electrolytes/Nutrition: Routine in and outs with follow-up chemistries 8.  Epilepsy/seizures, Lamictal 50 mg twice daily, Depakote 1000 mg every 12 hours.  EEG negative for seizure 9.  Hyperlipidemia.  Lipitor 10.  Hypertension.  Continue Norvasc 10 mg daily, HCTZ 25 mg daily Imdur 30 mg daily.  Monitor with increased mobility Vitals:   12/28/20 1927 12/29/20 0449  BP: 128/72 109/74  Pulse: (!) 52 (!) 56  Resp: 17 18  Temp: 98.6 F (37 C) 97.9 F (36.6 C)  SpO2: 100% 100%  controlled 5/31 11.  Diabetes mellitus.  Hemoglobin A1c 9.7.  SSI.  Patient on Lantus 40 units nightly prior to admission.  Resume as needed            CBG (last 3)  Recent Labs    12/28/20 1632 12/28/20 2056 12/29/20 0607  GLUCAP 198* 182* 88   , resume 5U today  , will need to determine whether pt will be going home on Lantus  12.  CAD with myocardial infarction.  Patient now on Brilinta 13.  History of tobacco polysubstance abuse.  Urine drug screen positive marijuana.  Provide counseling 14.  AKI on CKD admission creatinine 2.15 improved to 1.39.  Follow-up chemistries             -encourage adequate intake             -wasn't motivated to eat much while I was in room today. 15.  Questionable medical noncompliance.  Provide counseling 16. Lethargy: more alert  17.  Pancytopenia - mild - will consult pharmacy to see which meds are most likely involved   LOS: 8 days A FACE TO [place] 12/29/2020, 7:50 AM

## 2020-12-29 NOTE — NC FL2 (Signed)
[place] LEVEL OF CARE SCREENING TOOL     IDENTIFICATION  Patient Name: Melvin Taylor Birthdate: [birth year] Sex: male Admission Date (Current Location): 12/21/2020  [facility] and [place] Number:  [employer] and Address:  [facility], [place] [address] [place], [place] [address]      Provider Number:    Attending Physician Name and Address:  Charlett Blake, MD  Relative Name and Phone Number:  [name] [phone]    Current Level of Care: Hospital Recommended Level of Care: Nursing [place] Prior Approval Number:    Date Approved/Denied:   PASRR Number:    Discharge Plan: SNF    Current Diagnoses: Patient Active Problem List   Diagnosis Date Noted  . Left middle cerebral artery stroke ([place]) 12/21/2020  . Malnutrition of moderate degree 12/18/2020  . Middle cerebral artery embolism, 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  . Encounter for intubation   . Renal insufficiency   . Malignant hypertension   . Acute ischemic left MCA stroke ([place]) 12/16/2020  . Current smoker 06/21/2019  . Seizure-like activity ([place]) 05/06/2019  . Diabetes mellitus ([place]) 04/11/2019  . Anemia, chronic disease 07/04/2018  . Other specified diseases of the digestive system 10/30/2017  . CAD (coronary artery disease) 03/28/2016  . Chest pain 03/21/2016  . Mixed hyperlipidemia 03/11/2016  . Ischemic chest pain ([place])   . NSTEMI (non-ST elevated myocardial infarction) ([place])   . Coronary atherosclerosis of native coronary artery 03/03/2016  . Cervical spinal stenosis 12/14/2015  . Loss of weight 08/25/2015  . Essential hypertension, benign 07/23/2015  . Personal history of noncompliance with medical treatment, presenting hazards to health 07/23/2015  . Abdominal pain 06/08/2015  . Constipation 06/08/2015  . History of colonic polyps   . Diverticulosis of colon without hemorrhage   . Mucosal abnormality of stomach   . Encounter for  screening colonoscopy 04/16/2015  . Dysphagia 04/16/2015  . Partial symptomatic epilepsy with complex partial seizures, intractable, without status epilepticus ([place]) 11/10/2014  . Bipolar disorder, unspecified ([place]) 10/15/2014  . Schizophrenia ([place]) 10/15/2014  . DM type 2 causing vascular disease ([place]) 06/29/2014  . Musculoskeletal pain 06/29/2014  . Hyperglycemia without ketosis 09/07/2013  . 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 Other internal derangements of unspecified knee 01/13/2011  . Knee pain 12/28/2010  . Medial meniscus, posterior horn derangement 12/28/2010    Orientation RESPIRATION BLADDER Height & Weight     Self,Time,Situation  Normal Continent Weight:   Height:     BEHAVIORAL SYMPTOMS/MOOD NEUROLOGICAL BOWEL NUTRITION STATUS      Continent (inct @ night) Diet  AMBULATORY STATUS COMMUNICATION OF NEEDS Skin   Supervision Verbally Normal                       Personal Care Assistance Level of Assistance  Bathing,Dressing Bathing Assistance: Independent   Dressing Assistance: Independent     Functional Limitations Info             SPECIAL CARE FACTORS FREQUENCY  PT (By licensed PT),OT (By licensed OT),Speech therapy     PT Frequency: 5x a week OT Frequency: 5x a week     Speech Therapy Frequency: 5x a week      Contractures      Additional Factors Info                  Current Medications (12/29/2020):  This is the current hospital active medication list Current  Facility-Administered Medications  Medication Dose Route Frequency Provider Last Rate Last Admin  . acetaminophen (TYLENOL) tablet 650 mg  650 mg Oral Q4H PRN Angiulli, Lavon Paganini, PA-C       Or  . acetaminophen (TYLENOL) 160 MG/5ML solution 650 mg  650 mg Per Tube Q4H PRN Angiulli, Lavon Paganini, PA-C       Or  . acetaminophen (TYLENOL) suppository 650 mg  650 mg Rectal Q4H PRN Angiulli, Lavon Paganini, PA-C      . albuterol (PROVENTIL) (2.5 MG/3ML) 0.083% nebulizer  solution 2.5 mg  2.5 mg Nebulization Q4H PRN Angiulli, Lavon Paganini, PA-C      . amLODipine (NORVASC) tablet 10 mg  10 mg Oral Daily Cathlyn Parsons, PA-C   10 mg at 12/28/20 0843  . atorvastatin (LIPITOR) tablet 80 mg  80 mg Oral QHS Cathlyn Parsons, PA-C   80 mg at 12/28/20 2120  . bisacodyl (DULCOLAX) suppository 10 mg  10 mg Rectal Daily PRN Alger Simons T, MD      . divalproex (DEPAKOTE) DR tablet 1,000 mg  1,000 mg Oral Q12H AngiulliLavon Paganini, PA-C   1,000 mg at 12/28/20 2121  . enoxaparin (LOVENOX) injection 40 mg  40 mg Subcutaneous Q24H AngiulliLavon Paganini, PA-C   40 mg at 12/28/20 1235  . escitalopram (LEXAPRO) tablet 20 mg  20 mg Oral Daily Cathlyn Parsons, PA-C   20 mg at 12/28/20 3825  . feeding supplement (ENSURE ENLIVE / ENSURE PLUS) liquid 237 mL  237 mL Oral TID BM AngiulliLavon Paganini, PA-C   237 mL at 12/28/20 2121  . hydrochlorothiazide (HYDRODIURIL) tablet 25 mg  25 mg Oral Daily Cathlyn Parsons, PA-C   25 mg at 12/28/20 0844  . insulin aspart (novoLOG) injection 0-9 Units  0-9 Units Subcutaneous TID WC & HS Cathlyn Parsons, PA-C   2 Units at 12/28/20 2124  . insulin glargine (LANTUS) injection 5 Units  5 Units Subcutaneous Daily Kirsteins, Luanna Salk, MD      . isosorbide mononitrate (IMDUR) 24 hr tablet 30 mg  30 mg Oral Daily Cathlyn Parsons, PA-C   30 mg at 12/28/20 0843  . lamoTRIgine (LAMICTAL) tablet 50 mg  50 mg Oral BID Cathlyn Parsons, PA-C   50 mg at 12/28/20 0844  . melatonin tablet 10 mg  10 mg Oral QHS PRN Angiulli, Lavon Paganini, PA-C      . multivitamin with minerals tablet 1 tablet  1 tablet Oral Daily Cathlyn Parsons, PA-C   1 tablet at 12/28/20 [phone]  . pantoprazole (PROTONIX) EC tablet 40 mg  40 mg Oral Daily Cathlyn Parsons, PA-C   40 mg at 12/28/20 0844  . senna-docusate (Senokot-S) tablet 1 tablet  1 tablet Oral QHS PRN Angiulli, Lavon Paganini, PA-C      . sodium phosphate (FLEET) 7-19 GM/118ML enema 1 enema  1 enema Rectal Daily PRN Meredith Staggers, MD      . ticagrelor [facility]) tablet 90 mg  90 mg Oral BID Cathlyn Parsons, PA-C   90 mg at 12/28/20 2120   Or  . ticagrelor (BRILINTA) tablet 90 mg  90 mg Per Tube BID Cathlyn Parsons, PA-C         Discharge Medications: Please see discharge summary for a list of discharge medications.  Relevant Imaging Results:  Relevant Lab Results:   Additional Information    Dyanne Iha

## 2020-12-29 NOTE — Progress Notes (Signed)
Occupational Therapy Session Note  Patient Details  Name: Melvin Taylor MRN: [id number] Date of Birth: 1965/09/26  Today's Date: 12/29/2020 OT Individual Time: 6770-3403 OT Individual Time Calculation (min): 75 min    Short Term Goals: Week 1:  OT Short Term Goal 1 (Week 1): STGs equal to LTGs set at supervision to modified independent level.  Skilled Therapeutic Interventions/Progress Updates:    Pt received in bed and consented to OT tx. Pt seen for ADL routine analysis including bathing, dressing, grooming, and functional transfers and mobility. Pt requires close SUP-CGA during stanidng aspects of ADLs due to decreased standing balance. Cuing for safety throughout entire session. Pt demo's 2 LOB while standing in shower, req grab bars and therapist CGA to steady. Cuing to visually scan to R side to locate wash cloth and soap. Pt walked to and from bathroom with RW with CGA and completed dressing tasks seated EOB with SUP.  Pt walked to sink with RW and completed oral care and grooming with CGA and cuing for proficiency. Pt demo's decreased awareness as he turns on the sink, leaves it on, and the sink begins to fill up all the way. Cued pt to turn off water to not overflow sink, cuing to wipe toothpaste from mouth after oral care task. Pt noted to have small scrape on R arm that was bleeding, cleaned up and applied band aid. Pt instructed in BUE strengthening HEP to increase strength and activity tolerance for ADLs. Pt required cuing and increased time for instruction processing for upper body exercises. Pt instructed in 3# dowel rod elbow flexion, chest press, shoulder flexion, tricep extension, and shoulder press for 3x15 with mod cuing for proper technique and multiple rest breaks due to fatigue. Pt required mod encouragement for participation this session. After tx, pt helped back to bed and left with bed alarm on, all needs met.   Therapy Documentation Precautions:   Precautions Precautions: Fall Precaution Comments: R side inattention Restrictions Weight Bearing Restrictions: No   Vital Signs: Therapy Vitals Temp: 97.9 F (36.6 C) Pulse Rate: (!) 56 Resp: 18 BP: 109/74 Patient Position (if appropriate): Lying Oxygen Therapy SpO2: 100 % O2 Device: Room Air Pain: none     Therapy/Group: Individual Therapy  Shantese Raven 12/29/2020, 8:21 AM

## 2020-12-29 NOTE — Progress Notes (Signed)
Patient ID: Melvin Taylor, male   DOB: 07-09-1966, 55 y.o.   MRN: [id number]   Rolling Walker ordered through [place].   [place], [place]

## 2020-12-29 NOTE — Progress Notes (Signed)
Patient ID: Melvin Taylor, male   DOB: Jul 26, 1966, 55 y.o.   MRN: [id number]   Sw received call notification from pt sister [name]). Sister reports family is unable to physically care for pt/provide supervision. Sister plans to check with pt father to see if pt father can supervise pt a d/c. [name] would like to look in to SNF placement. SW will send pt FL2, sw informed sister of no guarantee of placement.   [place], [place]

## 2020-12-30 ENCOUNTER — Other Ambulatory Visit: Payer: Self-pay | Admitting: Family Medicine

## 2020-12-30 ENCOUNTER — Other Ambulatory Visit (HOSPITAL_COMMUNITY): Payer: Self-pay

## 2020-12-30 LAB — GLUCOSE, CAPILLARY
Glucose-Capillary: 72 mg/dL (ref 70–99)
Glucose-Capillary: 56 mg/dL — ABNORMAL LOW (ref 70–99)
Glucose-Capillary: 61 mg/dL — ABNORMAL LOW (ref 70–99)
Glucose-Capillary: 155 mg/dL — ABNORMAL HIGH (ref 70–99)

## 2020-12-30 MED ORDER — TICAGRELOR 90 MG PO TABS: 90.0000 mg | ORAL_TABLET | Freq: Two times a day (BID) | ORAL | 0 refills | Status: DC

## 2020-12-30 MED ORDER — ATORVASTATIN CALCIUM 80 MG PO TABS: 80.0000 mg | ORAL_TABLET | Freq: Every day | ORAL | 0 refills | Status: DC

## 2020-12-30 MED ORDER — GLIMEPIRIDE 2 MG PO TABS: 2.0000 mg | ORAL_TABLET | Freq: Every day | ORAL | 0 refills | Status: DC

## 2020-12-30 MED ORDER — LANTUS SOLOSTAR 100 UNIT/ML ~~LOC~~ SOPN: 5.0000 [IU] | PEN_INJECTOR | Freq: Every day | SUBCUTANEOUS | 0 refills | Status: DC

## 2020-12-30 MED ORDER — GLIMEPIRIDE 1 MG PO TABS
2.0000 mg | ORAL_TABLET | Freq: Every day | ORAL | Status: DC
  Administered 2020-12-30: 2 mg via ORAL
  Filled 2020-12-30: qty 2

## 2020-12-30 NOTE — Progress Notes (Signed)
Patient discharged home with brother in law this afternoon at 49. Information given to patient and family per PA, no questions noted. Brought down via wheelchair. Angie Fava

## 2020-12-30 NOTE — Progress Notes (Addendum)
Occupational Therapy Session Note  Patient Details  Name: Melvin Taylor MRN: [id number] Date of Birth: July 06, 1966  Today's Date: 12/30/2020 OT Individual Time: 9381-8299 OT Individual Time Calculation (min): 55 min    Short Term Goals: Week 1:  OT Short Term Goal 1 (Week 1): STGs equal to LTGs set at supervision to modified independent level.  Skilled Therapeutic Interventions/Progress Updates:    Session 1: (3716-9678)  Pt completed supine to sit EOB with supervision to begin session.  He then completed functional mobility down to the [place] elevators with use of the RW and supervision.  He was able to complete walking the rest of the way to the dayroom at the same level.  Worked on dynamic standing balance, cognition, and RUE functional use with use of the [employer] activity.  He was able to complete multiple sets of tossing the bean bags in standing with scanning to the right to locate them at supervision level.  Min assist was needed for ambulating to the board to pick up the bean bags after tossing them.  Max questioning cueing was needed to calculate his total number of points when adding them with previous throws, however he was able to add up his points for each interval of 5 bean bags with min questioning cueing.  Finished with work on [employer] coordination having him work on activity board removing screws and bolts with the RUE.  Mod instructional cueing to make sure to only use the RUE instead of attempting the left.  He was able to complete with increased time and supervision.  Finished session with ambulation back to the room at supervision level.  He was able to complete washing his hands at the sink with supervision.  Min instructional cueing for sequencing as he washed his hands with water and therapist had to cue him for use of soap on the right side.  He then did not remember to rinse them, so therapist cued him to do this before trying to dry them.  He transferred over to the bedside  recliner in preparation for lunch.  Call button and phone in reach with safety belt in place.    Session 2: (9381-0175)  Pt in recliner to start session agreeable to therapy.  He was able to complete functional mobility down to the therapy gym with min assist and no assistive device.  He was able to complete RUE coordination task using the resistive clothes pins.  He was able to complete the first 3 levels of clothespins but exhibited motor planning deficits while attempting to manipulate the clothespins to place them on the vertical bar.  Finished session with ambulation back to the room at min assist where he was left with his brother-in-law for transition home.  Discussed pt's current level of assist and need for 24 hour supervision/assist for safety as well.  He voiced understanding of this as well.    Therapy Documentation Precautions:  Precautions Precautions: Fall,Other (comment) Precaution Comments: R side inattention Restrictions Weight Bearing Restrictions: No  Pain: Pain Assessment Pain Scale: Faces Pain Score: 0-No pain ADL: See Care Tool Section for some details of mobility and selfcare  Therapy/Group: Individual Therapy  Tineshia Becraft OTR/L 12/30/2020, 12:12 PM

## 2020-12-30 NOTE — Discharge Instructions (Signed)
STROKE/TIA DISCHARGE INSTRUCTIONS SMOKING Cigarette smoking nearly doubles your risk of having a stroke & is the single most alterable risk factor  If you smoke or have smoked in the last 12 months, you are advised to quit smoking for your health.  Most of the excess cardiovascular risk related to smoking disappears within a year of stopping.  Ask you doctor about anti-smoking medications  [place] Quit Line: 1-800-QUIT NOW  Free Smoking Cessation Classes [phone]  CHOLESTEROL Know your levels; limit fat & cholesterol in your diet  Lipid Panel     Component Value Date/Time   CHOL 224 (H) 12/17/2020 0254   CHOL 286 (H) 02/12/2020 1334   TRIG 146 12/17/2020 0254   HDL 79 12/17/2020 0254   HDL 118 02/12/2020 1334   CHOLHDL 2.8 12/17/2020 0254   VLDL 29 12/17/2020 0254   LDLCALC 116 (H) 12/17/2020 0254   LDLCALC 145 (H) 02/12/2020 1334      Many patients benefit from treatment even if their cholesterol is at goal.  Goal: Total Cholesterol (CHOL) less than 160  Goal:  Triglycerides (TRIG) less than 150  Goal:  HDL greater than 40  Goal:  LDL (LDLCALC) less than 100   BLOOD PRESSURE American Stroke Association blood pressure target is less that 120/80 mm/Hg  Your discharge blood pressure is:     Monitor your blood pressure  Limit your salt and alcohol intake  Many individuals will require more than one medication for high blood pressure  DIABETES (A1c is a blood sugar average for last 3 months) Goal HGBA1c is under 7% (HBGA1c is blood sugar average for last 3 months)  Diabetes:    Lab Results  Component Value Date   HGBA1C 9.7 (H) 12/17/2020     Your HGBA1c can be lowered with medications, healthy diet, and exercise.  Check your blood sugar as directed by your physician  Call your physician if you experience unexplained or low blood sugars.  PHYSICAL ACTIVITY/REHABILITATION Goal is 30 minutes at least 4 days per week  Activity: Increase activity slowly, Therapies:  Physical Therapy: Home Health Return to work:   Activity decreases your risk of heart attack and stroke and makes your heart stronger.  It helps control your weight and blood pressure; helps you relax and can improve your mood.  Participate in a regular exercise program.  Talk with your doctor about the best form of exercise for you (dancing, walking, swimming, cycling).  DIET/WEIGHT Goal is to maintain a healthy weight  Your discharge diet is:  Diet Order            Diet heart healthy/carb modified Room service appropriate? Yes with Assist; Fluid consistency: Thin  Diet effective now                 liquids Your height is:    Your current weight is:   Your Body Mass Index (BMI) is:     Following the type of diet specifically designed for you will help prevent another stroke.  Your goal weight range is:    Your goal Body Mass Index (BMI) is 19-24.  Healthy food habits can help reduce 3 risk factors for stroke:  High cholesterol, hypertension, and excess weight.  RESOURCES Stroke/Support Group:  Call [phone]   STROKE EDUCATION PROVIDED/REVIEWED AND GIVEN TO PATIENT Stroke warning signs and symptoms How to activate emergency medical system (call 911). Medications prescribed at discharge. Need for follow-up after discharge. Personal risk factors for stroke. Pneumonia vaccine given:  Flu vaccine given:  My questions have been answered, the writing is legible, and I understand these instructions.  I will adhere to these goals & educational materials that have been provided to me after my discharge from the hospital.     Inpatient Rehab Discharge Instructions  [place] Discharge date and time: 12/30/20   Activities/Precautions/ Functional Status: Activity: activity as tolerated Diet: diabetic diet Wound Care: Routine skin checks Functional status:  ___ No restrictions     ___ Walk up steps independently _X__ 24/7 supervision/assistance   ___ Walk up steps  with assistance ___ Intermittent supervision/assistance  ___ Bathe/dress independently ___ Walk with walker     _x__ Bathe/dress with assistance ___ Walk Independently    ___ Shower independently ___ Walk with assistance    ___ Shower with assistance _X__ No alcohol     ___ Return to work/school ________  COMMUNITY REFERRALS UPON DISCHARGE:    Home Health:   PT     OT     ST                   Agency: TBD Phone:    Medical Equipment/Items Ordered: Conservation officer, nature                                                 Agency/Supplier: [employer]   Special Instructions: 1. No driving smoking or alcohol 2. Monitor blood sugars twice a day and take record with you when you go to your appt.   My questions have been answered and I understand these instructions. I will adhere to these goals and the provided educational materials after my discharge from the hospital.  Patient/Caregiver Signature _______________________________ Date __________  Clinician Signature _______________________________________ Date __________  Please bring this form and your medication list with you to all your follow-up doctor's appointments.

## 2020-12-30 NOTE — Progress Notes (Signed)
Speech Language Pathology Discharge Summary  Patient Details  Name: Melvin Taylor MRN: [id number] Date of Birth: [birth year]   Patient has met 3 of 3 long term goals.  Patient to discharge at overall Min;Mod level.  Reasons goals not met:     Clinical Impression/Discharge Summary:   Pt met 3 out 3 goals, discharging at mod-min A for basic language and cognitive tasks. Pt can name objects, however requires max A sentence completion and phonetic cues to name object function. Pt can identify objects in a field of 2 but required supervision A function cue when in a field of 3. Pt is able to express simple thoughts at phrase level. Pt is able to follow functional and simple commands with cues. Pt continues to requiring cuing for sustained attention, basic problem solving, recall and emergent/safety  awareness. Pt benefited from skilled ST services in order to maximize functional independence and reduce burden of care, requiring 24 hour supervision at discharge with continued skilled ST services.  Care Partner:  Caregiver Able to Provide Assistance: Yes  Type of Caregiver Assistance: Physical;Cognitive  Recommendation:  Home Health SLP;24 hour supervision/assistance  Rationale for SLP Follow Up: Maximize functional communication;Maximize cognitive function and independence;Reduce caregiver burden   Equipment: N/A   Reasons for discharge: Discharged from hospital   Patient/Family Agrees with Progress Made and Goals Achieved: Yes    Joanne Brander  [facility] 12/30/2020, 4:18 PM

## 2020-12-30 NOTE — Progress Notes (Addendum)
PROGRESS NOTE   Subjective/Complaints:  Pt's father coming in for D/C,   ROS: Patient denies CP, SOB, N/V/D   Objective:   No results found. No results for input(s): WBC, HGB, HCT, PLT in the last 72 hours. No results for input(s): NA, K, CL, CO2, GLUCOSE, BUN, CREATININE, CALCIUM in the last 72 hours.  Intake/Output Summary (Last 24 hours) at 12/30/2020 0842 Last data filed at 12/29/2020 1725 Gross per 24 hour  Intake 472 ml  Output --  Net 472 ml        Physical Exam: Vital Signs Blood pressure 130/70, pulse (!) 51, temperature 98.5 F (36.9 C), resp. rate 17, SpO2 100 %.  General: No acute distress Mood and affect are appropriate Heart: Regular rate and rhythm no rubs murmurs or extra sounds Lungs: Clear to auscultation, breathing unlabored, no rales or wheezes Abdomen: Positive bowel sounds, soft nontender to palpation, nondistended Extremities: No clubbing, cyanosis, or edema  Skin: intact  Neuro:motor strength is 5/5 in Left , 4/5 Right  deltoid, bicep, tricep, grip, hip flexor, knee extensors, ankle dorsiflexor and plantar flexor Normal sensory and cerebellar exam Musculoskeletal: Full range of motion in all 4 extremities. No joint swelling   Assessment/Plan: 1. Functional deficits due to CVA Stable for D/C today F/u PCP in 3-4 weeks F/u PM&R 2 weeks See D/C summary See D/C instructions Care Tool:  Bathing    Body parts bathed by patient: Right arm,Left arm,Chest,Abdomen,Front perineal area,Buttocks,Right upper leg,Left upper leg,Right lower leg,Left lower leg,Face         Bathing assist Assist Level: Contact Guard/Touching assist     Upper Body Dressing/Undressing Upper body dressing   What is the patient wearing?: Pull over shirt    Upper body assist Assist Level: Set up assist    Lower Body Dressing/Undressing Lower body dressing      What is the patient wearing?:  Pants,Underwear/pull up     Lower body assist Assist for lower body dressing: Contact Guard/Touching assist     Toileting Toileting    Toileting assist Assist for toileting: Contact Guard/Touching assist     Transfers Chair/bed transfer  Transfers assist     Chair/bed transfer assist level: Supervision/Verbal cueing Chair/bed transfer assistive device: Programmer, multimedia   Ambulation assist      Assist level: Supervision/Verbal cueing Assistive device: Walker-rolling Max distance: 273ft   Walk 10 feet activity   Assist     Assist level: Supervision/Verbal cueing Assistive device: Walker-rolling   Walk 50 feet activity   Assist    Assist level: Supervision/Verbal cueing Assistive device: Walker-rolling    Walk 150 feet activity   Assist Walk 150 feet activity did not occur: Safety/medical concerns  Assist level: Supervision/Verbal cueing Assistive device: Walker-rolling    Walk 10 feet on uneven surface  activity   Assist     Assist level: Supervision/Verbal cueing Assistive device: Aeronautical engineer Will patient use wheelchair at discharge?: No             Wheelchair 50 feet with 2 turns activity    Assist  Wheelchair 150 feet activity     Assist          Blood pressure 130/70, pulse (!) 51, temperature 98.5 F (36.9 C), resp. rate 17, SpO2 100 %.  Medical Problem List and Plan: 1.  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  Antithrombotics: -DVT/anticoagulation: Lovenox             -antiplatelet therapy: Brilinta 90 mg twice daily 3. Pain Management: Continue Tylenol as needed 4. Mood: Continue Lexapro 20 mg daily, melatonin 10 mg nightly as needed             -antipsychotic agents: N/A 5. Neuropsych: This patient is not capable of making decisions  on his own behalf. 6. Skin/Wound Care: Routine skin checks 7. Fluids/Electrolytes/Nutrition: Routine in and outs with follow-up chemistries 8.  Epilepsy/seizures, Lamictal 50 mg twice daily, Depakote 1000 mg every 12 hours.  EEG negative for seizure 9.  Hyperlipidemia.  Lipitor 10.  Hypertension.  Continue Norvasc 10 mg daily, HCTZ 25 mg daily Imdur 30 mg daily.  Monitor with increased mobility Vitals:   12/29/20 1946 12/30/20 0506  BP: 127/74 130/70  Pulse: (!) 55 (!) 51  Resp: 16 17  Temp: 98.4 F (36.9 C) 98.5 F (36.9 C)  SpO2: 100% 100%  controlled 5/31 11.  Diabetes mellitus.  Hemoglobin A1c 9.7.  SSI.  Patient on Lantus 40 units nightly prior to admission.  Resume as needed            CBG (last 3)  Recent Labs    12/29/20 1610 12/29/20 2116 12/30/20 0632  GLUCAP 251* 190* 72  low am CBG d/c Lantus , add glimipride to cover elevated PM CBGs  12.  CAD with myocardial infarction.  Patient now on Brilinta 13.  History of tobacco polysubstance abuse.  Urine drug screen positive marijuana.  Provide counseling 14.  AKI on CKD admission creatinine 2.15 improved to 1.39.  Follow-up chemistries             -encourage adequate intake             -wasn't motivated to eat much while I was in room today. 15.  Questionable medical noncompliance.  Provide counseling 16. Lethargy: more alert  17.  Pancytopenia - mild - will consult pharmacy to see which meds are most likely involved   LOS: 9 days A FACE TO [place] E Rukia Mcgillivray 12/30/2020, 8:42 AM

## 2020-12-30 NOTE — Progress Notes (Signed)
Inpatient Rehabilitation Care Coordinator Discharge Note  The overall goal for the admission was met for:   [facility]  Length of Stay: Yes, 8 Days  Discharge activity level: Yes  Home/community participation: Yes  Services provided included: MD, RD, PT, OT, SLP, RN, CM, TR, Pharmacy, Neuropsych and SW  Financial Services: Medicaid  Choices offered to/list presented to: [name] (Sister)  Follow-up services arranged: Home Health: TBD  Comments (or additional information): PT OT ST  Rolling Walker   Patient/Family verbalized understanding of follow-up arrangements: Yes  Individual responsible for coordination of the follow-up plan: [name] [phone]  Confirmed correct DME delivered: Dyanne Iha 12/30/2020    Dyanne Iha

## 2020-12-30 NOTE — Progress Notes (Signed)
Hypoglycemic Event  CBG: 61  Treatment: Gave lunch tray  Symptoms: none noted  Follow-up CBG: Time:1331 CBG Result:155  Possible Reasons for Event:   Comments/MD notified:Yes    Angie Fava

## 2020-12-30 NOTE — Progress Notes (Signed)
Patient ID: Melvin Taylor, male   DOB: 1966/02/12, 55 y.o.   MRN: [id number]   Pt referral sent to [facility] in [place], [place]

## 2020-12-30 NOTE — Progress Notes (Signed)
Physical Therapy Session Note  Patient Details  Name: Melvin Taylor MRN: [id number] Date of Birth: [birth year]  Today's Date: 12/30/2020 PT Individual Time: 0915-1010 PT Individual Time Calculation (min): 55 min   Short Term Goals: Week 1:  PT Short Term Goal 1 (Week 1): STG= LTG based on ELOS  Skilled Therapeutic Interventions/Progress Updates:     Patient in bed with RN administering medications upon PT arrival. Patient alert and agreeable to PT session. Patient denied pain at beginning of session, Patient reported 8/10 joint pain after ambulation, RN made aware. PT provided repositioning, rest breaks, and distraction as pain interventions throughout session. Patient oriented to person and date, June 1st, and recalled today was supposed to be his d/c date. Patient unable to answer questions for place, situation, and day of the week, provided 2013 for the year. Reoriented patient to these and provided education on stroke and present deficits.   Therapeutic Activity: Bed Mobility: Patient performed supine to/from sit with mod I for increased time in a flat bed without use of bed rails.  Transfers: Patient performed sit to/from stand x4 with supervision using RW. Provided verbal cues for hand placement, reaching back to sit and completing turn prior to sitting.  Gait Training/Community Integration:  Patient ambulated >100 feet then >600 feet x2 to/from outside of [facility] using RW with close supervision and CGA x2 due to minor LOB due to decreased environmental awareness on the R. Ambulated with decreased gait speed, decreased step length and height, increased B hip and knee flexion in stance, forward trunk lean, downward head gaze, and poor safety awareness with use of RW when fatigued and on turns. Provided verbal cues for proximity to RW for safety, safe use of RW on thresholds and unlevel side walk, and visual scanning of environment. Instructed patient to utilize wall signage to navigate to  the gift shop to locate the atrium. Patient required mod-max cues for locating signs and using them to navigate to the atrium and mod-min cues to return back to the room.   Patient able to recall date with correct year and situation from orientation questions reviewed at the beginning of the session. Patient unable to recall building and location and perseverated on the word stroke when attempting to recall. Attempted to have patient recall activities performed during session, patient unable due to fatigue and beginning to close his eyes at end of session.  Patient in bed at end of session with breaks locked, bed alarm set, and all needs within reach.   Therapy Documentation Precautions:  Precautions Precautions: Fall,Other (comment) Precaution Comments: R side inattention Restrictions Weight Bearing Restrictions: No   Therapy/Group: Individual Therapy  Lorance Pickeral L Kimberle Stanfill PT, DPT  12/30/2020, 4:12 PM

## 2020-12-30 NOTE — TOC Benefit Eligibility Note (Signed)
Patient Teacher, English as a foreign language completed.    The patient is currently admitted and upon discharge could be taking Jardiance 10 mg.  Prior Authorization is required.  The patient is insured through [place], [place] Patient Advocate Specialist [facility] Antimicrobial Stewardship Team Direct Number: [phone]  Fax: [phone]

## 2020-12-31 ENCOUNTER — Other Ambulatory Visit: Payer: Self-pay | Admitting: Adult Health

## 2020-12-31 NOTE — Progress Notes (Signed)
Sister, Rondell Reams, called in to say she was ws not finding medications at patient's home listed to be continued on the AVS from discharge from inpatient rehabilitation on 5/30. Per DC summary patient was supposed to be on amlodipine, HCTZ and protonix which could not be found at his home or sent to pharmacy. She reports that patient's medications were very disorganized and out of date. She is disposing of old Rxs and trying to organize medications. She plans on managing his medications moving forward. These Rxs were called in to [place] [facility] at her request. Eustaquio Maize took the Rxs over the phone. She stated amlodipine was already filled and waiting.

## 2020-12-31 NOTE — Progress Notes (Signed)
Patient ID: Melvin Taylor, male   DOB: 03/22/1966, 55 y.o.   MRN: [id number]   Pt declined by [facility] due to service area.  [place], [place]

## 2020-12-31 NOTE — Progress Notes (Signed)
Patient ID: Melvin Taylor, male   DOB: [birth year], 55 y.o.   MRN: [id number]   Pt declined by [facility] due to staffing  Erlene Quan, [place]

## 2020-12-31 NOTE — Progress Notes (Signed)
Patient ID: Melvin Taylor, male   DOB: August 08, 1965, 55 y.o.   MRN: [id number]   PT declined by [facility] due to insurance  Erlene Quan, [place]

## 2021-01-04 ENCOUNTER — Telehealth (HOSPITAL_COMMUNITY): Payer: Self-pay

## 2021-01-04 NOTE — Telephone Encounter (Signed)
Called to schedule f/u, no answer, left vm. AW 

## 2021-01-07 ENCOUNTER — Encounter: Payer: Medicaid Other | Attending: Physical Medicine & Rehabilitation | Admitting: Physical Medicine & Rehabilitation

## 2021-01-07 ENCOUNTER — Other Ambulatory Visit: Payer: Self-pay

## 2021-01-07 ENCOUNTER — Encounter: Payer: Self-pay | Admitting: Physical Medicine & Rehabilitation

## 2021-01-07 VITALS — BP 127/74 | HR 58 | Temp 98.2°F | Ht 67.0 in | Wt 139.6 lb

## 2021-01-07 DIAGNOSIS — I63512 Cerebral infarction due to unspecified occlusion or stenosis of left middle cerebral artery: Secondary | ICD-10-CM

## 2021-01-07 NOTE — Patient Instructions (Signed)
Please call for appt if re evaluation is needed

## 2021-01-07 NOTE — Progress Notes (Signed)
Subjective:    Patient ID: Melvin Taylor, male    DOB: May 01, 1966, 55 y.o.   MRN: [id number] Admit date: 12/21/2020 Discharge date: 12/30/2020  55 year old right-handed male with history of hypertension hyperlipidemia, CAD with myocardial infarction maintained on Plavix, epilepsy/seizures maintained on Depakote as well as Lamictal, schizophrenia, diabetes mellitus, tobacco abuse as well as polysubstance abuse questionable medical compliance.  Per chart review patient lives alone.  1 level home.  Has a PCA 4 hours a day 6 days a week.  Independent with assistive device.  He has a sister in the area.  Presented 12/16/2020 with acute onset of right-sided weakness and speech difficulties.  CT/MRI showed scattered acute infarcts within the left MCA and MCA/PCA watershed territories affecting the cortical/subcortical left frontal parietal and occipital lobe.  Multiple acute infarcts also present within the left insula and basal ganglia.  No significant mass-effect.  No hemorrhagic conversion.  MRA demonstrated moderate stenosis within the proximal M1 and right MCA.  Severe stenosis within the P2 and right PCA.  Patient underwent mechanical thrombectomy per interventional radiology.  Echocardiogram with ejection fraction of 65 to 84% grade 1 diastolic dysfunction.  EEG negative for seizure.  Admission chemistries glucose 175, creatinine 2.15, hemoglobin 12, hemoglobin A1c 9.7, urine drug screen was positive for marijuana.  Currently maintained on Brilinta for CVA prophylaxis, no aspirin secondary to questionable allergy.  Maintained on Lovenox for DVT prophylaxis.  Therapy evaluations completed due to patient's right side weakness and speech difficulties was admitted for a comprehensive rehab program.    HPI The patient is now living with his father in [place].  It is about 1 hour drive from here.  His sister accompanies the patient to clinic today.  The patient has had memory problems since his stroke  on 11/16/2020.  He has not been driving for a number of years because of uncontrolled seizures.  The patient indicates that he no longer smokes cigarettes.  He also does not smoke marijuana.  We discussed gust the risk of cigarettes on recurrent stroke as well as marijuana with balance and overall cognition.  Dressing  and bathing self  No falls  Amb without assistive  Home health PT OT and speech were ordered on discharge from the hospital however because of his insurance as well as location he is difficult to be finding a home health agency to take on his case. [facility] is checking about other HH agencies   Pain Inventory Average Pain 8 Pain Right Now 8 My pain is intermittent, sharp, and aching  LOCATION OF PAIN  Head   BOWEL Number of stools per week: 21 Oral laxative use No  Type of laxative n/a Enema or suppository use No  History of colostomy No  Incontinent No   BLADDER Normal In and out cath, frequency n/a Able to self cath No  Bladder incontinence No  Frequent urination No  Leakage with coughing No  Difficulty starting stream No  Incomplete bladder emptying No    Mobility walk without assistance ability to climb steps?  yes do you drive?  no  Function disabled: date disabled maybe 5 years I need assistance with the following:  household duties and shopping  Neuro/Psych No problems in this area  Prior Studies Any changes since last visit?  no  Physicians involved in your care Any changes since last visit?  no   Family History  Problem Relation Age of Onset  . Diabetes Father   . Hypertension Father   .  Arthritis Other   . Diabetes Other   . Asthma Other   . Stroke Sister    Social History   Socioeconomic History  . Marital status: Single    Spouse name: Not on file  . Number of children: Not on file  . Years of education: 11th grade  . Highest education level: Not on file  Occupational History  . Occupation: unemployed     Fish farm manager: NOT EMPLOYED  Tobacco Use  . Smoking status: Every Day    Packs/day: 0.50    Years: 30.00    Pack years: 15.00    Types: Cigarettes  . Smokeless tobacco: Never  . Tobacco comments:    3 cigs per day  Vaping Use  . Vaping Use: Never used  Substance and Sexual Activity  . Alcohol use: No  . Drug use: Yes    Types: Marijuana  . Sexual activity: Not Currently  Other Topics Concern  . Not on file  Social History Narrative   Lives alone   Drinks a cup of coffee a day   Social Determinants of Health   Financial Resource Strain: Not on file  Food Insecurity: Not on file  Transportation Needs: Not on file  Physical Activity: Not on file  Stress: Not on file  Social Connections: Not on file   Past Surgical History:  Procedure Laterality Date  . ANTERIOR CERVICAL DECOMP/DISCECTOMY FUSION N/A 12/14/2015   Procedure: ANTERIOR CERVICAL DECOMPRESSION/DISCECTOMY FUSION CERVICAL FIVE -SIX;  Surgeon: Earnie Larsson, MD;  Location: [facility];  Service: Neurosurgery;  Laterality: N/A;  . BIOPSY  05/07/2015   Procedure: BIOPSY (Gastric);  Surgeon: [name], MD;  Location: [facility];  Service: Endoscopy;;  . CARDIAC CATHETERIZATION N/A 03/07/2016   Procedure: Left Heart Cath and Coronary Angiography;  Surgeon: Belva Crome, MD;  Location: [facility];  Service: Cardiovascular;  Laterality: N/A;  . COLONOSCOPY WITH PROPOFOL N/A 05/07/2015   [facility]:ENIDPOEUMP diverticulosis, multiple colon polyps removed, tubular adenoma, serrated colon polyp. Next colonoscopy October 2019  . COLONOSCOPY WITH PROPOFOL N/A 08/02/2018   Procedure: COLONOSCOPY WITH PROPOFOL;  Surgeon: [name], MD;  Location: [facility];  Service: Endoscopy;  Laterality: N/A;  12:00pm  . CORONARY ARTERY BYPASS GRAFT N/A 03/28/2016   Procedure: CORONARY ARTERY BYPASS GRAFTING (CABG) x 5 USING GREATER SAPHENOUS VEIN;  Surgeon: [name], MD;  Location: [facility];  Service: Open Heart Surgery;  Laterality: N/A;  .  ENDOVEIN HARVEST OF GREATER SAPHENOUS VEIN Right 03/28/2016   Procedure: ENDOVEIN HARVEST OF GREATER SAPHENOUS VEIN;  Surgeon: [name], MD;  Location: [facility];  Service: Open Heart Surgery;  Laterality: Right;  . ESOPHAGEAL DILATION N/A 05/07/2015   Procedure: ESOPHAGEAL DILATION WITH 56FR MALONEY DILATOR;  Surgeon: [name], MD;  Location: [facility];  Service: Endoscopy;  Laterality: N/A;  . ESOPHAGOGASTRODUODENOSCOPY (EGD) WITH PROPOFOL N/A 05/07/2015   RMR: Status post dilation of normal esophagus. Gastritis.  . IR CT HEAD LTD  12/17/2020  . IR CT HEAD LTD  12/17/2020  . IR INTRA CRAN STENT  12/17/2020  . IR PERCUTANEOUS ART THROMBECTOMY/INFUSION INTRACRANIAL INC DIAG ANGIO  12/17/2020  . KNEE SURGERY Left    arthroscopy  . MANDIBLE FRACTURE SURGERY    . POLYPECTOMY  05/07/2015   Procedure: POLYPECTOMY (Hepatic Flexure, Distal Transverse Colon, Rectal);  Surgeon: [name], MD;  Location: [facility];  Service: Endoscopy;;  . RADIOLOGY WITH ANESTHESIA N/A 12/16/2020   Procedure: IR WITH  ANESTHESIA;  Surgeon: Luanne Bras, MD;  Location: [facility];  Service: Radiology;  Laterality: N/A;  . TEE WITHOUT CARDIOVERSION N/A 03/28/2016   Procedure: TRANSESOPHAGEAL ECHOCARDIOGRAM (TEE);  Surgeon: [name], MD;  Location: [facility];  Service: Open Heart Surgery;  Laterality: N/A;  . TONSILLECTOMY     Past Medical History:  Diagnosis Date  . Anxiety   . Bipolar 1 disorder ([place])   . Bone spur    left heel  . Cervical radiculopathy   . Chest pain   . Chronic back pain   . Chronic chest wall pain   . Chronic neck pain   . Coronary artery disease    a. s/p CABG in 03/2016 with LIMA-LAD, SVG-D1, SVG-RCA, and Seq SVG-mid and distal OM  . Depression   . Diabetes mellitus    Type II  . Hallucinations    "long history of them"  . Headache(784.0)   . History of gout   . HTN (hypertension)   . Insomnia   . Myocardial infarction ([place]) 2017  . Neuropathy   . Pain management   .  Polysubstance abuse ([place])   . Right leg pain    chronic  . Schizophrenia ([place])   . Seizures ([place])    last sz between July 5-9th, 2016; epilepsy  . Shortness of breath dyspnea    with exertion  . Sleep apnea   . Transfusion of blood product refused for religious reason    Temp 98.2 F (36.8 C)   Ht 5\' 7"  (1.702 m)   Wt 139 lb 9.6 oz (63.3 kg)   BMI 21.86 kg/m   Opioid Risk Score:   Fall Risk Score:  `1  Depression screen PHQ 2/9  No flowsheet data found.  Review of Systems  Neurological:  Positive for headaches.  All other systems reviewed and are negative.     Objective:   Physical Exam Vitals and nursing note reviewed.  Constitutional:      Appearance: He is normal weight.  Eyes:     General: No scleral icterus.    Extraocular Movements: Extraocular movements intact.     Conjunctiva/sclera: Conjunctivae normal.     Pupils: Pupils are equal, round, and reactive to light.  Cardiovascular:     Rate and Rhythm: Normal rate and regular rhythm.     Pulses: Normal pulses.     Heart sounds: Normal heart sounds. No murmur heard. Pulmonary:     Effort: Pulmonary effort is normal. No respiratory distress.     Breath sounds: Normal breath sounds.  Abdominal:     General: Bowel sounds are normal. There is no distension.     Palpations: Abdomen is soft.  Musculoskeletal:     Comments: No pain with range of motion of the upper or lower limbs No neck pain with cervical spine range of motion  Skin:    General: Skin is warm and dry.  Neurological:     Mental Status: He is alert and oriented to person, place, and time.     Cranial Nerves: No dysarthria.     Coordination: Romberg sign negative. Coordination abnormal. Finger-Nose-Finger Test abnormal. Impaired rapid alternating movements.     Gait: Gait is intact.     Comments: Oriented to person but not place, oriented to time Has difficulty with naming unfamiliar objects Motor strength is 4/5 in the right deltoid, bicep,  tricep, grip 5/5 in the left deltoid, bicep, tricep, grip 5/5 bilateral hip flexor, knee extensor, ankle dorsiflexor and  plantar flexor Sensation intact to light touch bilateral upper and lower limbs Coordination is reduced with finger to thumb opposition right upper extremity also positive dysdiadochokinesis with rapid alternating supination pronation right upper extremity.  Psychiatric:        Mood and Affect: Mood normal.        Behavior: Behavior normal.          Assessment & Plan:  #1.  Left MCA distribution infarct status post revascularization procedure on Brilinta.  We will follow-up with [facility] neurology and with interventional radiology He has had very good recovery of his lower extremity function as well as his balance. He still has right upper extremity weakness as well as coordination difficulties.  I do think he would benefit from additional home health or outpatient OT In addition he would benefit from home health or outpatient PT.  Given his geographic location as well as since insurance he is having some difficulty in locating a service provider.  He is following up with neurology and they are assisting with this. I will see the patient back on a as needed basis.   Call primary MD listed below for Medication refills   [name], [place]    PCP - General, Family Medicine    Since 05/13/2020    [phone]

## 2021-01-23 ENCOUNTER — Other Ambulatory Visit: Payer: Self-pay | Admitting: Physical Medicine and Rehabilitation

## 2021-01-27 ENCOUNTER — Other Ambulatory Visit: Payer: Self-pay

## 2021-01-27 ENCOUNTER — Ambulatory Visit (INDEPENDENT_AMBULATORY_CARE_PROVIDER_SITE_OTHER): Payer: Medicaid Other | Admitting: Podiatry

## 2021-01-27 DIAGNOSIS — E1142 Type 2 diabetes mellitus with diabetic polyneuropathy: Secondary | ICD-10-CM

## 2021-01-27 DIAGNOSIS — M79675 Pain in left toe(s): Secondary | ICD-10-CM

## 2021-01-27 DIAGNOSIS — M79674 Pain in right toe(s): Secondary | ICD-10-CM | POA: Diagnosis not present

## 2021-01-27 DIAGNOSIS — B351 Tinea unguium: Secondary | ICD-10-CM | POA: Diagnosis not present

## 2021-01-29 ENCOUNTER — Encounter: Payer: Self-pay | Admitting: Podiatry

## 2021-01-29 NOTE — Progress Notes (Signed)
  Subjective:  Patient ID: Melvin Taylor, male    DOB: 07-03-1966,  MRN: [id number]  Chief Complaint  Patient presents with   Nail Problem    Nail trim    56 y.o. male returns for the above complaint.  Patient presents with thickened elongated dystrophic toenails x10.  Pain on palpation.  Patient is a diabetic with last A1c of 9.7.  He would like for me to have the nails debrided down.  He recently had a stroke and is currently on blood thinners.  Denies any other acute complaints.  Objective:  There were no vitals filed for this visit. Podiatric Exam: Vascular: dorsalis pedis and posterior tibial pulses are palpable bilateral. Capillary return is immediate. Temperature gradient is WNL. Skin turgor WNL  Sensorium: Normal Semmes Weinstein monofilament test. Normal tactile sensation bilaterally. Nail Exam: Pt has thick disfigured discolored nails with subungual debris noted bilateral entire nail hallux through fifth toenails.  Pain on palpation to the nails. Ulcer Exam: There is no evidence of ulcer or pre-ulcerative changes or infection. Orthopedic Exam: Muscle tone and strength are WNL. No limitations in general ROM. No crepitus or effusions noted. HAV  B/L.  Hammer toes 2-5  B/L. Skin: No Porokeratosis. No infection or ulcers    Assessment & Plan:   1. Pain due to onychomycosis of toenails of both feet   2. Diabetic peripheral neuropathy associated with type 2 diabetes mellitus ([place])     Patient was evaluated and treated and all questions answered.  Onychomycosis with pain  -Nails palliatively debrided as below. -Educated on self-care  Procedure: Nail Debridement Rationale: pain  Type of Debridement: manual, sharp debridement. Instrumentation: Nail nipper, rotary burr. Number of Nails: 10  Procedures and Treatment: Consent by patient was obtained for treatment procedures. The patient understood the discussion of treatment and procedures well. All questions were answered  thoroughly reviewed. Debridement of mycotic and hypertrophic toenails, 1 through 5 bilateral and clearing of subungual debris. No ulceration, no infection noted.  Return Visit-Office Procedure: Patient instructed to return to the office for a follow up visit 3 months for continued evaluation and treatment.  Boneta Lucks, DPM    No follow-ups on file.

## 2021-02-02 NOTE — Progress Notes (Signed)
Patient was seen in office today for custom orthotic measurements by EJ with [facility]. Patient was advised that the office will call when the orthotics are available for pick-up. Patient verbalized understanding.

## 2021-02-05 ENCOUNTER — Other Ambulatory Visit: Payer: Self-pay | Admitting: Physical Medicine and Rehabilitation

## 2021-02-08 ENCOUNTER — Other Ambulatory Visit: Payer: Self-pay | Admitting: Physical Medicine and Rehabilitation

## 2021-02-09 ENCOUNTER — Emergency Department (HOSPITAL_COMMUNITY)
Admission: EM | Admit: 2021-02-09 | Discharge: 2021-02-10 | Disposition: A | Discharge status: Home / Self Care | Payer: Medicaid Other | Attending: Emergency Medicine | Admitting: Emergency Medicine

## 2021-02-09 ENCOUNTER — Other Ambulatory Visit: Payer: Self-pay | Admitting: "Endocrinology

## 2021-02-09 ENCOUNTER — Other Ambulatory Visit: Payer: Self-pay | Admitting: Nurse Practitioner

## 2021-02-09 ENCOUNTER — Encounter (HOSPITAL_COMMUNITY): Payer: Self-pay | Admitting: Emergency Medicine

## 2021-02-09 ENCOUNTER — Other Ambulatory Visit: Payer: Self-pay

## 2021-02-09 DIAGNOSIS — E1159 Type 2 diabetes mellitus with other circulatory complications: Secondary | ICD-10-CM | POA: Insufficient documentation

## 2021-02-09 DIAGNOSIS — I2510 Atherosclerotic heart disease of native coronary artery without angina pectoris: Secondary | ICD-10-CM | POA: Insufficient documentation

## 2021-02-09 DIAGNOSIS — Z87891 Personal history of nicotine dependence: Secondary | ICD-10-CM | POA: Insufficient documentation

## 2021-02-09 DIAGNOSIS — Z79899 Other long term (current) drug therapy: Secondary | ICD-10-CM | POA: Diagnosis not present

## 2021-02-09 DIAGNOSIS — I10 Essential (primary) hypertension: Secondary | ICD-10-CM

## 2021-02-09 DIAGNOSIS — Z955 Presence of coronary angioplasty implant and graft: Secondary | ICD-10-CM | POA: Diagnosis not present

## 2021-02-09 DIAGNOSIS — Z7984 Long term (current) use of oral hypoglycemic drugs: Secondary | ICD-10-CM | POA: Insufficient documentation

## 2021-02-09 LAB — BASIC METABOLIC PANEL
Anion gap: 7 (ref 5–15)
BUN: 33 mg/dL — ABNORMAL HIGH (ref 6–20)
CO2: 27 mmol/L (ref 22–32)
Calcium: 8.5 mg/dL — ABNORMAL LOW (ref 8.9–10.3)
Chloride: 102 mmol/L (ref 98–111)
Creatinine, Ser: 1.91 mg/dL — ABNORMAL HIGH (ref 0.61–1.24)
GFR, Estimated: 41 mL/min — ABNORMAL LOW (ref >60)
Glucose, Bld: 180 mg/dL — ABNORMAL HIGH (ref 70–99)
Potassium: 4 mmol/L (ref 3.5–5.1)
Sodium: 136 mmol/L (ref 135–145)

## 2021-02-09 LAB — CBC
HCT: 35.1 % — ABNORMAL LOW (ref 39.0–52.0)
Hemoglobin: 11.7 g/dL — ABNORMAL LOW (ref 13.0–17.0)
MCH: 31.1 pg (ref 26.0–34.0)
MCHC: 33.3 g/dL (ref 30.0–36.0)
MCV: 93.4 fL (ref 80.0–100.0)
Platelets: 200 10*3/uL (ref 150–400)
RBC: 3.76 MIL/uL — ABNORMAL LOW (ref 4.22–5.81)
RDW: 14 % (ref 11.5–15.5)
WBC: 4.6 10*3/uL (ref 4.0–10.5)
nRBC: 0 % (ref 0.0–0.2)

## 2021-02-09 MED ORDER — HYDROCHLOROTHIAZIDE 25 MG PO TABS
25.0000 mg | ORAL_TABLET | Freq: Every day | ORAL | Status: DC
  Administered 2021-02-10: 25 mg via ORAL
  Filled 2021-02-09: qty 1

## 2021-02-09 MED ORDER — AMLODIPINE BESYLATE 10 MG PO TABS: 10.0000 mg | ORAL_TABLET | Freq: Every day | ORAL | 0 refills | Status: DC

## 2021-02-09 MED ORDER — HYDROCHLOROTHIAZIDE 25 MG PO TABS: 25.0000 mg | ORAL_TABLET | Freq: Every day | ORAL | 0 refills | Status: DC

## 2021-02-09 MED ORDER — AMLODIPINE BESYLATE 5 MG PO TABS
10.0000 mg | ORAL_TABLET | Freq: Once | ORAL | Status: AC
  Administered 2021-02-10: 10 mg via ORAL
  Filled 2021-02-09: qty 2

## 2021-02-09 NOTE — ED Triage Notes (Addendum)
Pt c/o hypertension today. Pt states that he just checked his BP due to not feeling good. Pt states he recently had a stroke and was put on medications for HTN but has been out of them for a few days.

## 2021-02-09 NOTE — Discharge Instructions (Addendum)
You are seen today for high blood pressure.  It is important that you take your blood pressure medications.  Your lab work is at baseline.  If you develop chest pain, shortness of breath, new focal deficits or strokelike symptoms, you should be reevaluated.

## 2021-02-09 NOTE — Telephone Encounter (Signed)
The pt has appt but is requesting a refill on Glimepiride. I see where this was prescribed by Dr Naaman Plummer while he was admitted.  Appt is 7/20.

## 2021-02-09 NOTE — Telephone Encounter (Signed)
Lantus sent to pharmacy. Will hold off on Glimeperide until OV on 7/20. Pts family member notified.

## 2021-02-09 NOTE — Telephone Encounter (Signed)
Pt has an appointment 7/20 but he has been out of glimepiride (AMARYL) 2 MG tablet  for awhile now and his sugar is up.    [place], [place] Phone:  [phone]  Fax:  [phone]

## 2021-02-10 ENCOUNTER — Ambulatory Visit (INDEPENDENT_AMBULATORY_CARE_PROVIDER_SITE_OTHER): Payer: Medicaid Other | Admitting: Nurse Practitioner

## 2021-02-10 ENCOUNTER — Other Ambulatory Visit: Payer: Self-pay | Admitting: Nurse Practitioner

## 2021-02-10 ENCOUNTER — Encounter: Payer: Self-pay | Admitting: Nurse Practitioner

## 2021-02-10 ENCOUNTER — Other Ambulatory Visit: Payer: Self-pay | Admitting: Physical Medicine and Rehabilitation

## 2021-02-10 VITALS — BP 145/88 | HR 64 | Ht 67.0 in | Wt 143.0 lb

## 2021-02-10 DIAGNOSIS — E1159 Type 2 diabetes mellitus with other circulatory complications: Secondary | ICD-10-CM | POA: Diagnosis not present

## 2021-02-10 MED ORDER — GLIMEPIRIDE 2 MG PO TABS: 2.0000 mg | ORAL_TABLET | Freq: Every day | ORAL | 3 refills | Status: DC

## 2021-02-10 MED ORDER — TICAGRELOR 90 MG PO TABS: 90.0000 mg | ORAL_TABLET | Freq: Two times a day (BID) | ORAL | 0 refills | Status: DC

## 2021-02-10 MED ORDER — ISOSORBIDE MONONITRATE ER 30 MG PO TB24: 30.0000 mg | ORAL_TABLET | Freq: Every day | ORAL | 0 refills | Status: DC

## 2021-02-10 NOTE — ED Provider Notes (Signed)
[facility] EMERGENCY DEPARTMENT Provider Note   CSN: [id number] Arrival date & time: 02/09/21  1810     History Chief Complaint  Patient presents with   Hypertension    Melvin Taylor is a 55 y.o. male.  HPI     This 55 year old male with a history of bipolar disorder, diabetes, hypertension, polysubstance abuse, recent stroke who presents with concerns for high blood pressure.  Patient reports that he just was not feeling well earlier today and took his blood pressure and noted it to be high.  He denies chest pain, shortness of breath, new strokelike symptoms but was not feeling well in general.  He has been out of his blood pressure medication for approximately 1 week.  No fevers.  No sick contacts.  Patient states that he feels okay now.  He is currently asymptomatic.  Past Medical History:  Diagnosis Date   Anxiety    Bipolar 1 disorder ([place])    Bone spur    left heel   Cervical radiculopathy    Chest pain    Chronic back pain    Chronic chest wall pain    Chronic neck pain    Coronary artery disease    a. s/p CABG in 03/2016 with LIMA-LAD, SVG-D1, SVG-RCA, and Seq SVG-mid and distal OM   Depression    Diabetes mellitus    Type II   Hallucinations    "long history of them"   Headache(784.0)    History of gout    HTN (hypertension)    Insomnia    Myocardial infarction ([place]) 2017   Neuropathy    Pain management    Polysubstance abuse ([place])    Right leg pain    chronic   Schizophrenia ([place])    Seizures ([place])    last sz between July 5-9th, 2016; epilepsy   Shortness of breath dyspnea    with exertion   Sleep apnea    Transfusion of blood product refused for religious reason     Patient Active Problem List   Diagnosis Date Noted   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   Malnutrition of moderate degree 12/18/2020   Middle cerebral artery embolism, 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   Encounter for intubation    Renal insufficiency    Malignant  hypertension    Acute ischemic left MCA stroke ([place]) 12/16/2020   Current smoker 06/21/2019   Seizure-like activity ([place]) 05/06/2019   Diabetes mellitus ([place]) 04/11/2019   Anemia, chronic disease 07/04/2018   Other specified diseases of the digestive system 10/30/2017   CAD (coronary artery disease) 03/28/2016   Chest pain 03/21/2016   Mixed hyperlipidemia 03/11/2016   Ischemic chest pain (HCC)    NSTEMI (non-ST elevated myocardial infarction) ([place])    Coronary atherosclerosis of native coronary artery 03/03/2016   Cervical spinal stenosis 12/14/2015   Loss of weight 08/25/2015   Essential hypertension, benign 07/23/2015   Personal history of noncompliance with medical treatment, presenting hazards to health 07/23/2015   Abdominal pain 06/08/2015   Constipation 06/08/2015   History of colonic polyps    Diverticulosis of colon without hemorrhage    Mucosal abnormality of stomach    Encounter for screening colonoscopy 04/16/2015   Dysphagia 04/16/2015   Partial symptomatic epilepsy with complex partial seizures, intractable, without status epilepticus ([place]) 11/10/2014   Bipolar disorder, unspecified ([place]) 10/15/2014   Schizophrenia ([place]) 10/15/2014   DM type 2 causing vascular disease ([place]) 06/29/2014   Musculoskeletal pain 06/29/2014   Hyperglycemia without  ketosis 09/07/2013   Degeneration disease of medial meniscus 01/13/2011   Other internal derangements of unspecified knee 01/13/2011   Knee pain 12/28/2010   Medial meniscus, posterior horn derangement 12/28/2010    Past Surgical History:  Procedure Laterality Date   ANTERIOR CERVICAL DECOMP/DISCECTOMY FUSION N/A 12/14/2015   Procedure: ANTERIOR CERVICAL DECOMPRESSION/DISCECTOMY FUSION CERVICAL FIVE -SIX;  Surgeon: Earnie Larsson, MD;  Location: [place] [facility]URO ORS;  Service: Neurosurgery;  Laterality: N/A;   BIOPSY  05/07/2015   Procedure: BIOPSY (Gastric);  Surgeon: [name], MD;  Location: [facility];  Service: Endoscopy;;    CARDIAC CATHETERIZATION N/A 03/07/2016   Procedure: Left Heart Cath and Coronary Angiography;  Surgeon: Belva Crome, MD;  Location: [place] CV LAB;  Service: Cardiovascular;  Laterality: N/A;   COLONOSCOPY WITH PROPOFOL N/A 05/07/2015   JOA:[id number] diverticulosis, multiple colon polyps removed, tubular adenoma, serrated colon polyp. Next colonoscopy October 2019   COLONOSCOPY WITH PROPOFOL N/A 08/02/2018   Procedure: COLONOSCOPY WITH PROPOFOL;  Surgeon: [name], MD;  Location: [facility];  Service: Endoscopy;  Laterality: N/A;  12:00pm   CORONARY ARTERY BYPASS GRAFT N/A 03/28/2016   Procedure: CORONARY ARTERY BYPASS GRAFTING (CABG) x 5 USING GREATER SAPHENOUS VEIN;  Surgeon: [name], MD;  Location: [place] OR;  Service: Open Heart Surgery;  Laterality: N/A;   ENDOVEIN HARVEST OF GREATER SAPHENOUS VEIN Right 03/28/2016   Procedure: ENDOVEIN HARVEST OF GREATER SAPHENOUS VEIN;  Surgeon: [name], MD;  Location: [place];  Service: Open Heart Surgery;  Laterality: Right;   ESOPHAGEAL DILATION N/A 05/07/2015   Procedure: ESOPHAGEAL DILATION WITH 56FR MALONEY DILATOR;  Surgeon: [name], MD;  Location: [facility];  Service: Endoscopy;  Laterality: N/A;   ESOPHAGOGASTRODUODENOSCOPY (EGD) WITH PROPOFOL N/A 05/07/2015   RMR: Status post dilation of normal esophagus. Gastritis.   IR CT HEAD LTD  12/17/2020   IR CT HEAD LTD  12/17/2020   IR INTRA CRAN STENT  12/17/2020   IR PERCUTANEOUS ART THROMBECTOMY/INFUSION INTRACRANIAL INC DIAG ANGIO  12/17/2020   KNEE SURGERY Left    arthroscopy   MANDIBLE FRACTURE SURGERY     POLYPECTOMY  05/07/2015   Procedure: POLYPECTOMY (Hepatic Flexure, Distal Transverse Colon, Rectal);  Surgeon: [name], MD;  Location: [facility];  Service: Endoscopy;;   RADIOLOGY WITH ANESTHESIA N/A 12/16/2020   Procedure: IR WITH ANESTHESIA;  Surgeon: Luanne Bras, MD;  Location: [place];  Service: Radiology;  Laterality: N/A;   TEE WITHOUT CARDIOVERSION N/A 03/28/2016    Procedure: TRANSESOPHAGEAL ECHOCARDIOGRAM (TEE);  Surgeon: [name], MD;  Location: [place];  Service: Open Heart Surgery;  Laterality: N/A;   TONSILLECTOMY         Family History  Problem Relation Age of Onset   Diabetes Father    Hypertension Father    Arthritis Other    Diabetes Other    Asthma Other    Stroke Sister     Social History   Tobacco Use   Smoking status: Former    Packs/day: 0.50    Years: 30.00    Pack years: 15.00    Types: Cigarettes   Smokeless tobacco: Never   Tobacco comments:    3 cigs per day  Vaping Use   Vaping Use: Never used  Substance Use Topics   Alcohol use: Not Currently   Drug use: Not Currently    Types: Marijuana    Home Medications Prior to Admission medications   Medication Sig Start Date  End Date Taking? Authorizing Provider  albuterol (PROVENTIL HFA;VENTOLIN HFA) 108 (90 Base) MCG/ACT inhaler Inhale 2 puffs into the lungs every 4 (four) hours as needed for wheezing or shortness of breath.     [provider]  amLODipine (NORVASC) 10 MG tablet Take 1 tablet (10 mg total) by mouth daily. 02/09/21   Abass Misener, Barbette Hair, MD  atorvastatin (LIPITOR) 80 MG tablet Take 1 tablet (80 mg total) by mouth at bedtime. 12/30/20   Love, Ivan Anchors, PA-C  cycloSPORINE (RESTASIS) 0.05 % ophthalmic emulsion Place 1 drop into both eyes 2 (two) times daily.    [provider]  divalproex (DEPAKOTE) 500 MG DR tablet Take 2 tablets (1,000 mg total) by mouth every 12 (twelve) hours. 12/21/20   Bailey-Modzik, Delila A, NP  escitalopram (LEXAPRO) 20 MG tablet Take 20 mg by mouth daily.     [provider]  glimepiride (AMARYL) 2 MG tablet Take 1 tablet (2 mg total) by mouth daily with breakfast. 12/31/20   Love, Ivan Anchors, PA-C  hydrochlorothiazide (HYDRODIURIL) 25 MG tablet Take 1 tablet (25 mg total) by mouth daily. 02/09/21   Meshilem Machuca, Barbette Hair, MD  isosorbide mononitrate (IMDUR) 30 MG 24 hr tablet Take 1 tablet (30 mg total) by  mouth daily. 09/14/20   Hayden Rasmussen, MD  lamoTRIgine (LAMICTAL) 25 MG tablet Take 50 mg by mouth 2 (two) times daily. 05/20/19   [provider]  LANTUS SOLOSTAR 100 UNIT/ML Solostar Pen INJECT 40 UNITS INTO THE SKIN AT BEDTIME. 02/09/21   Nida, Marella Chimes, MD  Melatonin 10 MG CAPS Take 10 capsules by mouth at bedtime as needed (for sleep). 06/12/19   [provider]  Multiple Vitamin (MULTIVITAMIN WITH MINERALS) TABS tablet Take 1 tablet by mouth daily. Patient not taking: Reported on 01/07/2021 12/22/20   Bailey-Modzik, Mayo Ao A, NP  nitroGLYCERIN (NITROSTAT) 0.4 MG SL tablet Place 1 tablet (0.4 mg total) under the tongue every 5 (five) minutes as needed for chest pain. 01/15/20 04/14/20  Herminio Commons, MD  pantoprazole (PROTONIX) 40 MG tablet Take 1 tablet (40 mg total) by mouth daily. 12/22/20   Bailey-Modzik, Delila A, NP  senna-docusate (SENOKOT-S) 8.6-50 MG tablet Take 1 tablet by mouth at bedtime as needed for mild constipation or moderate constipation. 12/21/20   Bailey-Modzik, Delila A, NP  ticagrelor (BRILINTA) 90 MG TABS tablet Take 1 tablet (90 mg total) by mouth 2 (two) times daily. 12/30/20   Love, Ivan Anchors, PA-C    Allergies    Aspirin  Review of Systems   Review of Systems  Constitutional:  Negative for fever.  Respiratory:  Negative for shortness of breath.   Cardiovascular:  Negative for chest pain.  Gastrointestinal:  Negative for abdominal pain, nausea and vomiting.  Neurological:  Negative for weakness.  All other systems reviewed and are negative.  Physical Exam Updated Vital Signs BP (!) 172/86   Pulse (!) 51   Temp 97.9 F (36.6 C) (Oral)   Resp (!) 21   Ht 1.702 m (5\' 7" )   Wt 63.5 kg   SpO2 100%   BMI 21.93 kg/m   Physical Exam Vitals and nursing note reviewed.  Constitutional:      Appearance: He is well-developed. He is not ill-appearing.  HENT:     Head: Normocephalic and atraumatic.     Nose: Nose normal.      Mouth/Throat:     Mouth: Mucous membranes are moist.  Eyes:     Pupils: Pupils  are equal, round, and reactive to light.  Cardiovascular:     Rate and Rhythm: Normal rate and regular rhythm.     Heart sounds: Normal heart sounds. No murmur heard. Pulmonary:     Effort: Pulmonary effort is normal. No respiratory distress.     Breath sounds: Normal breath sounds. No wheezing.  Abdominal:     General: Bowel sounds are normal.     Palpations: Abdomen is soft.     Tenderness: There is no abdominal tenderness. There is no rebound.  Musculoskeletal:     Cervical back: Neck supple.     Right lower leg: No edema.     Left lower leg: No edema.  Lymphadenopathy:     Cervical: No cervical adenopathy.  Skin:    General: Skin is warm and dry.  Neurological:     Mental Status: He is alert and oriented to person, place, and time.     Comments: Cranial nerves II through XII intact, slightly diminished strength right upper extremity consistent with prior documented stroke,   Psychiatric:        Mood and Affect: Mood normal.    ED Results / Procedures / Treatments   Labs (all labs ordered are listed, but only abnormal results are displayed) Labs Reviewed  CBC - Abnormal; Notable for the following components:      Result Value   RBC 3.76 (*)    Hemoglobin 11.7 (*)    HCT 35.1 (*)    All other components within normal limits  BASIC METABOLIC PANEL - Abnormal; Notable for the following components:   Glucose, Bld 180 (*)    BUN 33 (*)    Creatinine, Ser 1.91 (*)    Calcium 8.5 (*)    GFR, Estimated 41 (*)    All other components within normal limits    EKG None  Radiology No results found.  Procedures Procedures   Medications Ordered in ED Medications  hydrochlorothiazide (HYDRODIURIL) tablet 25 mg (25 mg Oral Given 02/10/21 0018)  amLODipine (NORVASC) tablet 10 mg (10 mg Oral Given 02/10/21 0019)    ED Course  I have reviewed the triage vital signs and the nursing  notes.  Pertinent labs & imaging results that were available during my care of the patient were reviewed by me and considered in my medical decision making (see chart for details).    MDM Rules/Calculators/A&P                          Patient presents with hypertension.  States he felt poorly earlier today and has been out of his blood pressure medications.  Recent stroke.  Denies chest pain, shortness of breath, any strokelike symptoms.  Was noted to have elevated BP to 170s over 80s.  Otherwise he is nontoxic and nonfocal.  Labs reviewed from triage and largely at the patient's baseline.  He is currently asymptomatic.  We will provide him with his home dose of HCTZ and amlodipine.  Will discharge with a short course prescription and have him follow-up closely with his primary physician.  No evidence at this time of hypertensive urgency or emergency.  After history, exam, and medical workup I feel the patient has been appropriately medically screened and is safe for discharge home. Pertinent diagnoses were discussed with the patient. Patient was given return precautions.  Final Clinical Impression(s) / ED Diagnoses Final diagnoses:  Hypertension, unspecified type    Rx / DC Orders ED Discharge Orders  Ordered    amLODipine (NORVASC) 10 MG tablet  Daily        02/09/21 2359    hydrochlorothiazide (HYDRODIURIL) 25 MG tablet  Daily        02/09/21 2359             Merryl Hacker, MD 02/10/21 [phone]

## 2021-02-10 NOTE — Progress Notes (Signed)
02/10/2021   Endocrinology follow-up note   Subjective:    Patient ID: Melvin Taylor, male    DOB: May 16, 1966, PCP [name], FNP   Past Medical History:  Diagnosis Date   Anxiety    Bipolar 1 disorder ([place])    Bone spur    left heel   Cervical radiculopathy    Chest pain    Chronic back pain    Chronic chest wall pain    Chronic neck pain    Coronary artery disease    a. s/p CABG in 03/2016 with LIMA-LAD, SVG-D1, SVG-RCA, and Seq SVG-mid and distal OM   Depression    Diabetes mellitus    Type II   Hallucinations    "long history of them"   Headache(784.0)    History of gout    HTN (hypertension)    Insomnia    Myocardial infarction ([place]) 2017   Neuropathy    Pain management    Polysubstance abuse ([place])    Right leg pain    chronic   Schizophrenia ([place])    Seizures ([place])    last sz between July 5-9th, 2016; epilepsy   Shortness of breath dyspnea    with exertion   Sleep apnea    Transfusion of blood product refused for religious reason    Past Surgical History:  Procedure Laterality Date   ANTERIOR CERVICAL DECOMP/DISCECTOMY FUSION N/A 12/14/2015   Procedure: ANTERIOR CERVICAL DECOMPRESSION/DISCECTOMY FUSION CERVICAL FIVE -SIX;  Surgeon: Earnie Larsson, MD;  Location: [facility];  Service: Neurosurgery;  Laterality: N/A;   BIOPSY  05/07/2015   Procedure: BIOPSY (Gastric);  Surgeon: [name], MD;  Location: [facility];  Service: Endoscopy;;   CARDIAC CATHETERIZATION N/A 03/07/2016   Procedure: Left Heart Cath and Coronary Angiography;  Surgeon: Belva Crome, MD;  Location: [facility];  Service: Cardiovascular;  Laterality: N/A;   COLONOSCOPY WITH PROPOFOL N/A 05/07/2015   BJY:[id number] diverticulosis, multiple colon polyps removed, tubular adenoma, serrated colon polyp. Next colonoscopy October 2019   COLONOSCOPY WITH PROPOFOL N/A 08/02/2018   Procedure: COLONOSCOPY WITH PROPOFOL;  Surgeon: [name], MD;  [facility]: [facility];  Service:  Endoscopy;  Laterality: N/A;  12:00pm   CORONARY ARTERY BYPASS GRAFT N/A 03/28/2016   Procedure: CORONARY ARTERY BYPASS GRAFTING (CABG) x 5 USING GREATER SAPHENOUS VEIN;  Surgeon: [name], MD;  Location: [facility];  Service: Open Heart Surgery;  Laterality: N/A;   ENDOVEIN HARVEST OF GREATER SAPHENOUS VEIN Right 03/28/2016   Procedure: ENDOVEIN HARVEST OF GREATER SAPHENOUS VEIN;  Surgeon: [name], MD;  Location: [facility];  Service: Open Heart Surgery;  Laterality: Right;   ESOPHAGEAL DILATION N/A 05/07/2015   Procedure: ESOPHAGEAL DILATION WITH 56FR MALONEY DILATOR;  Surgeon: [name], MD;  Location: [facility];  Service: Endoscopy;  Laterality: N/A;   ESOPHAGOGASTRODUODENOSCOPY (EGD) WITH PROPOFOL N/A 05/07/2015   RMR: Status post dilation of normal esophagus. Gastritis.   IR CT HEAD LTD  12/17/2020   IR CT HEAD LTD  12/17/2020   IR INTRA CRAN STENT  12/17/2020   IR PERCUTANEOUS ART THROMBECTOMY/INFUSION INTRACRANIAL INC DIAG ANGIO  12/17/2020   KNEE SURGERY Left    arthroscopy   MANDIBLE FRACTURE SURGERY     POLYPECTOMY  05/07/2015   Procedure: POLYPECTOMY (Hepatic Flexure, Distal Transverse Colon, Rectal);  Surgeon: [name], MD;  Location: [facility];  Service: Endoscopy;;   RADIOLOGY WITH ANESTHESIA N/A 12/16/2020   Procedure: IR WITH ANESTHESIA;  Surgeon: Luanne Bras, MD;  Location: [facility];  Service: Radiology;  Laterality: N/A;   TEE WITHOUT CARDIOVERSION N/A 03/28/2016   Procedure: TRANSESOPHAGEAL ECHOCARDIOGRAM (TEE);  Surgeon: [name], MD;  Location: [facility];  Service: Open Heart Surgery;  Laterality: N/A;   TONSILLECTOMY     Social History   Socioeconomic History   Marital status: Single    Spouse name: Not on file   Number of children: Not on file   Years of education: 11th grade   Highest education level: Not on file  Occupational History   Occupation: unemployed    Employer: NOT EMPLOYED  Tobacco Use   Smoking status: Former    Packs/day: 0.50    Years:  30.00    Pack years: 15.00    Types: Cigarettes   Smokeless tobacco: Never   Tobacco comments:    3 cigs per day  Vaping Use   Vaping Use: Never used  Substance and Sexual Activity   Alcohol use: Not Currently   Drug use: Not Currently    Types: Marijuana   Sexual activity: Not Currently  Other Topics Concern   Not on file  Social History Narrative   Lives alone   Drinks a cup of coffee a day   Social Determinants of Health   Financial Resource Strain: Not on file  Food Insecurity: Not on file  Transportation Needs: Not on file  Physical Activity: Not on file  Stress: Not on file  Social Connections: Not on file   Outpatient Encounter Medications as of 02/10/2021  Medication Sig   amLODipine (NORVASC) 10 MG tablet Take 1 tablet (10 mg total) by mouth daily.   atorvastatin (LIPITOR) 80 MG tablet Take 1 tablet (80 mg total) by mouth at bedtime.   divalproex (DEPAKOTE) 500 MG DR tablet Take 2 tablets (1,000 mg total) by mouth every 12 (twelve) hours.   escitalopram (LEXAPRO) 20 MG tablet Take 20 mg by mouth daily.    hydrochlorothiazide (HYDRODIURIL) 25 MG tablet Take 1 tablet (25 mg total) by mouth daily.   lamoTRIgine (LAMICTAL) 25 MG tablet Take 50 mg by mouth 2 (two) times daily.   LANTUS SOLOSTAR 100 UNIT/ML Solostar Pen INJECT 40 UNITS INTO THE SKIN AT BEDTIME.   pantoprazole (PROTONIX) 40 MG tablet Take 1 tablet (40 mg total) by mouth daily.   ranolazine (RANEXA) 500 MG 12 hr tablet Take 500 mg by mouth 2 (two) times daily.   [DISCONTINUED] glimepiride (AMARYL) 2 MG tablet Take 1 tablet (2 mg total) by mouth daily with breakfast. (Patient taking differently: Take 4 mg by mouth daily with breakfast.)   albuterol (PROVENTIL HFA;VENTOLIN HFA) 108 (90 Base) MCG/ACT inhaler Inhale 2 puffs into the lungs every 4 (four) hours as needed for wheezing or shortness of breath.    cycloSPORINE (RESTASIS) 0.05 % ophthalmic emulsion Place 1 drop into both eyes 2 (two) times daily.    glimepiride (AMARYL) 2 MG tablet Take 1 tablet (2 mg total) by mouth daily with breakfast.   isosorbide mononitrate (IMDUR) 30 MG 24 hr tablet Take 1 tablet (30 mg total) by mouth daily.   Melatonin 10 MG CAPS Take 10 capsules by mouth at bedtime as needed (for sleep).   Multiple Vitamin (MULTIVITAMIN WITH MINERALS) TABS tablet Take 1 tablet by mouth daily. (Patient not taking: Reported on 01/07/2021)   nitroGLYCERIN (NITROSTAT) 0.4 MG SL tablet Place 1 tablet (0.4 mg total) under the tongue every 5 (five) minutes as needed for chest pain.  senna-docusate (SENOKOT-S) 8.6-50 MG tablet Take 1 tablet by mouth at bedtime as needed for mild constipation or moderate constipation.   ticagrelor (BRILINTA) 90 MG TABS tablet Take 1 tablet (90 mg total) by mouth 2 (two) times daily.   No facility-administered encounter medications on file as of 02/10/2021.   ALLERGIES: Allergies  Allergen Reactions   Aspirin Swelling and Other (See Comments)    Facial swelling    VACCINATION STATUS: Immunization History  Administered Date(s) Administered   Influenza,trivalent, recombinat, inj, PF 05/02/2015   Moderna Sars-Covid-2 Vaccination 11/01/2019, 12/03/2019   Pneumococcal Polysaccharide-23 12/22/2020   Tdap 01/18/2015    Diabetes He presents for his follow-up diabetic visit. He has type 2 diabetes mellitus. Onset time: He was diagnosed the approximate age of 16 years. His disease course has been improving. There are no hypoglycemic associated symptoms. Pertinent negatives for hypoglycemia include no confusion, headaches, pallor or seizures. Pertinent negatives for diabetes include no chest pain, no fatigue, no polydipsia, no polyphagia, no polyuria and no weakness. There are no hypoglycemic complications. Symptoms are stable. Diabetic complications include a CVA and heart disease. (He is seriously noncompliant patient. He is now diagnosed with coronary artery disease at [facility] since last visit. He is  awaiting for his outpatient cardiology evaluation. This assay to be set for 03/24/2016.  -Recently had CVA- was in CIR for rehabilitation) Risk factors for coronary artery disease include diabetes mellitus, dyslipidemia, hypertension, male sex, tobacco exposure and sedentary lifestyle. Current diabetic treatment includes insulin injections and oral agent (monotherapy). He is compliant with treatment most of the time. His weight is fluctuating minimally. He is following a generally unhealthy diet. When asked about meal planning, he reported none. He has had a previous visit with a dietitian. He never participates in exercise. His home blood glucose trend is fluctuating minimally. His breakfast blood glucose range is generally 140-180 mg/dl. His dinner blood glucose range is generally 140-180 mg/dl. His bedtime blood glucose range is generally 130-140 mg/dl. (He presents today, accompanied by a family member, with his logs, no meter, showing stable glycemic profile both fasting and postprandially.  His most recent A1c on 5/19 was 9.7% when he was hospitalized for CVA.  He has since completed CIR stay for rehabilitation where he was started on Glimepiride 2 mg daily.  There are no episodes of hypoglycemia documented or reported. ) An ACE inhibitor/angiotensin II receptor blocker is not being taken.  Hyperlipidemia This is a chronic problem. The current episode started more than 1 year ago. Exacerbating diseases include diabetes. Pertinent negatives include no chest pain, myalgias or shortness of breath. Current antihyperlipidemic treatment includes statins. Risk factors for coronary artery disease include diabetes mellitus, dyslipidemia, hypertension, a sedentary lifestyle and male sex.  Hypertension Pertinent negatives include no chest pain, headaches, neck pain, palpitations or shortness of breath. Risk factors for coronary artery disease include smoking/tobacco exposure, diabetes mellitus, dyslipidemia and  male gender. Hypertensive end-organ damage includes CVA.    Review of systems  Constitutional: + Minimally fluctuating body weight,  current Body mass index is 22.4 kg/m. , no fatigue, no subjective hyperthermia, no subjective hypothermia,  Eyes: no blurry vision, no xerophthalmia ENT: no sore throat, no nodules palpated in throat, no dysphagia/odynophagia, no hoarseness Cardiovascular: no chest pain, no shortness of breath, no palpitations, no leg swelling Respiratory: no cough, no shortness of breath Gastrointestinal: no nausea/vomiting/diarrhea Musculoskeletal: no muscle/joint aches Skin: no rashes, no hyperemia Neurological: no tremors, no numbness, no tingling, no dizziness Psychiatric: no depression,  no anxiety    Objective:    BP (!) 145/88 (BP Location: Left Arm)   Pulse 64   Ht 5\' 7"  (1.702 m)   Wt 143 lb (64.9 kg)   BMI 22.40 kg/m   Wt Readings from Last 3 Encounters:  02/10/21 143 lb (64.9 kg)  02/09/21 140 lb (63.5 kg)  01/07/21 139 lb 9.6 oz (63.3 kg)    BP Readings from Last 3 Encounters:  02/10/21 (!) 145/88  02/10/21 (!) 172/86  01/07/21 127/74     Physical Exam- Limited  Constitutional:  Body mass index is 22.4 kg/m. , not in acute distress, normal state of mind, expressive aphasia   Chemistry (most recent): Lab Results  Component Value Date   NA 136 02/09/2021   K 4.0 02/09/2021   CL 102 02/09/2021   CO2 27 02/09/2021   BUN 33 (H) 02/09/2021   CREATININE 1.91 (H) 02/09/2021   Diabetic Labs (most recent): Lab Results  Component Value Date   HGBA1C 9.7 (H) 12/17/2020   HGBA1C 12.7 08/25/2020   HGBA1C 10.4 05/08/2020   Lipid Panel     Component Value Date/Time   CHOL 224 (H) 12/17/2020 0254   CHOL 286 (H) 02/12/2020 1334   TRIG 146 12/17/2020 0254   HDL 79 12/17/2020 0254   HDL 118 02/12/2020 1334   CHOLHDL 2.8 12/17/2020 0254   VLDL 29 12/17/2020 0254   LDLCALC 116 (H) 12/17/2020 0254   LDLCALC 145 (H) 02/12/2020 1334     Assessment & Plan:   1) Uncontrolled type 2 diabetes mellitus with complication, with long-term current use of insulin (HCC)  - diabetes is  complicated by Coronary artery disease , CKD , chronic heavy smoking, noncompliance and patient remains at a high risk for more acute and chronic complications of diabetes which include CAD, CVA, CKD, retinopathy, and neuropathy. These are all discussed in detail with the patient.  He presents today, accompanied by a family member, with his logs, no meter, showing stable glycemic profile both fasting and postprandially.  His most recent A1c on 5/19 was 9.7% when he was hospitalized for CVA.  He has since completed CIR stay for rehabilitation where he was started on Glimepiride 2 mg daily.  There are no episodes of hypoglycemia documented or reported.  There is some question as to how much insulin he takes and at what times since he does not organize his own medications.  -I had a long discussion with him about treatment course of type 2 diabetes in the pathology behind its complications.  - Nutritional counseling repeated at each appointment due to patients tendency to fall back in to old habits.  - The patient admits there is a room for improvement in their diet and drink choices. -  Suggestion is made for the patient to avoid simple carbohydrates from their diet including Cakes, Sweet Desserts / Pastries, Ice Cream, Soda (diet and regular), Sweet Tea, Candies, Chips, Cookies, Sweet Pastries, Store Bought Juices, Alcohol in Excess of 1-2 drinks a day, Artificial Sweeteners, Coffee Creamer, and "Sugar-free" Products. This will help patient to have stable blood glucose profile and potentially avoid unintended weight gain.   - I encouraged the patient to switch to unprocessed or minimally processed complex starch and increased protein intake (animal or plant source), fruits, and vegetables.   - Patient is advised to stick to a routine mealtimes to eat 3  meals a day and avoid unnecessary snacks (to snack only to correct hypoglycemia).  - I  have approached patient with the following individualized plan to manage diabetes and patient reluctantly accepts .  -He is advised to continue same regimen for now as written on discharge from hospital.  His aunt is to be calling to let us know the exact dosage of his insulin.  He can continue Glimepiride 2 mg po daily.   -He is encouraged to continue monitoring blood glucose 4 times daily, before meals and before bed, and to call the clinic if he has readings less than 70 or greater than 300 for 3 tests in a row.  -He is warned not to take insulin without proper monitoring of blood glucose.  - He is not a suitable candidate for incretin therapy nor metformin/SG LT 2 inhibitors.  - Patient specific target  for A1c; LDL, HDL, Triglycerides, and  Waist Circumference were discussed in detail.  2) BP/HTN:  His blood pressure is controlled to target.  He is advised to continue Amlodipine 10 mg p.o. daily, Hydrochlorothiazide 25 mg p.o. daily.  His Lisinopril was stopped during his hospitalization for AKI.   3)  Hyperlipidemia:  His most recent lipid panel from 12/17/20 showed uncontrolled LDL at 116.  He will continue to benefit from statin intervention.  He is advised to continue Atorvastatin 80 mg p.o. nightly.   Side effects and precautions discussed with him.       4) Medical noncompliance: He has significant medical history of noncompliance, I advised him to stay focused on self-care.  5) Chronic Care/Health Maintenance: -Patient is on Statin medications and encouraged to continue to follow up with Ophthalmology, Podiatrist at least yearly or according to recommendations, and advised to quit smoking. I have recommended yearly flu vaccine and pneumonia vaccination at least every 5 years; moderate intensity exercise for up to 150 minutes weekly; and  sleep for at least 7 hours a day.   He is advised to  continue follow-up with his PMD for primary care needs.     I spent 30 minutes in the care of the patient today including review of labs from [place], Lipids, Thyroid Function, Hematology (current and previous including abstractions from other facilities); face-to-face time discussing  his blood glucose readings/logs, discussing hypoglycemia and hyperglycemia episodes and symptoms, medications doses, his options of short and long term treatment based on the latest standards of care / guidelines;  discussion about incorporating lifestyle medicine;  and documenting the encounter.    Please refer to Patient Instructions for Blood Glucose Monitoring and Insulin/Medications Dosing Guide"  in media tab for additional information. Please  also refer to " Patient Self Inventory" in the Media  tab for reviewed elements of pertinent patient history.  Lendon Ka Hollander participated in the discussions, expressed understanding, and voiced agreement with the above plans.  All questions were answered to his satisfaction. he is encouraged to contact clinic should he have any questions or concerns prior to his return visit.    Follow up plan: -Return in about 1 month (around 03/13/2021) for Diabetes F/U, Bring meter and logs.  Rayetta Pigg, [facility] [address] [place], [place] [address] Phone: [phone] Fax: [phone]  02/10/2021, 4:40 PM

## 2021-02-10 NOTE — Patient Instructions (Signed)
Diabetes Mellitus and Nutrition, Adult When you have diabetes, or diabetes mellitus, it is very important to have healthy eating habits because your blood sugar (glucose) levels are greatly affected by what you eat and drink. Eating healthy foods in the right amounts, at about the same times every day, can help you:  Control your blood glucose.  Lower your risk of heart disease.  Improve your blood pressure.  Reach or maintain a healthy weight. What can affect my meal plan? Every person with diabetes is different, and each person has different needs for a meal plan. Your health care provider may recommend that you work with a dietitian to make a meal plan that is best for you. Your meal plan may vary depending on factors such as:  The calories you need.  The medicines you take.  Your weight.  Your blood glucose, blood pressure, and cholesterol levels.  Your activity level.  Other health conditions you have, such as heart or kidney disease. How do carbohydrates affect me? Carbohydrates, also called carbs, affect your blood glucose level more than any other type of food. Eating carbs naturally raises the amount of glucose in your blood. Carb counting is a method for keeping track of how many carbs you eat. Counting carbs is important to keep your blood glucose at a healthy level, especially if you use insulin or take certain oral diabetes medicines. It is important to know how many carbs you can safely have in each meal. This is different for every person. Your dietitian can help you calculate how many carbs you should have at each meal and for each snack. How does alcohol affect me? Alcohol can cause a sudden decrease in blood glucose (hypoglycemia), especially if you use insulin or take certain oral diabetes medicines. Hypoglycemia can be a life-threatening condition. Symptoms of hypoglycemia, such as sleepiness, dizziness, and confusion, are similar to symptoms of having too much  alcohol.  Do not drink alcohol if: ? Your health care provider tells you not to drink. ? You are pregnant, may be pregnant, or are planning to become pregnant.  If you drink alcohol: ? Do not drink on an empty stomach. ? Limit how much you use to:  0-1 drink a day for women.  0-2 drinks a day for men. ? Be aware of how much alcohol is in your drink. In the [place] one drink equals one 12 oz bottle of beer (355 mL), one 5 oz glass of wine (148 mL), or one 1 oz glass of hard liquor (44 mL). ? Keep yourself hydrated with water, diet soda, or unsweetened iced tea.  Keep in mind that regular soda, juice, and other mixers may contain a lot of sugar and must be counted as carbs. What are tips for following this plan? Reading food labels  Start by checking the serving size on the "Nutrition Facts" label of packaged foods and drinks. The amount of calories, carbs, fats, and other nutrients listed on the label is based on one serving of the item. Many items contain more than one serving per package.  Check the total grams (g) of carbs in one serving. You can calculate the number of servings of carbs in one serving by dividing the total carbs by 15. For example, if a food has 30 g of total carbs per serving, it would be equal to 2 servings of carbs.  Check the number of grams (g) of saturated fats and trans fats in one serving. Choose foods that have   a low amount or none of these fats.  Check the number of milligrams (mg) of salt (sodium) in one serving. Most people should limit total sodium intake to less than 2,300 mg per day.  Always check the nutrition information of foods labeled as "low-fat" or "nonfat." These foods may be higher in added sugar or refined carbs and should be avoided.  Talk to your dietitian to identify your daily goals for nutrients listed on the label. Shopping  Avoid buying canned, pre-made, or processed foods. These foods tend to be high in fat, sodium, and added  sugar.  Shop around the outside edge of the grocery store. This is where you will most often find fresh fruits and vegetables, bulk grains, fresh meats, and fresh dairy. Cooking  Use low-heat cooking methods, such as baking, instead of high-heat cooking methods like deep frying.  Cook using healthy oils, such as olive, canola, or sunflower oil.  Avoid cooking with butter, cream, or high-fat meats. Meal planning  Eat meals and snacks regularly, preferably at the same times every day. Avoid going long periods of time without eating.  Eat foods that are high in fiber, such as fresh fruits, vegetables, beans, and whole grains. Talk with your dietitian about how many servings of carbs you can eat at each meal.  Eat 4-6 oz (112-168 g) of lean protein each day, such as lean meat, chicken, fish, eggs, or tofu. One ounce (oz) of lean protein is equal to: ? 1 oz (28 g) of meat, chicken, or fish. ? 1 egg. ?  cup (62 g) of tofu.  Eat some foods each day that contain healthy fats, such as avocado, nuts, seeds, and fish.   What foods should I eat? Fruits Berries. Apples. Oranges. Peaches. Apricots. Plums. Grapes. Mango. Papaya. Pomegranate. Kiwi. Cherries. Vegetables Lettuce. Spinach. Leafy greens, including kale, chard, collard greens, and mustard greens. Beets. Cauliflower. Cabbage. Broccoli. Carrots. Green beans. Tomatoes. Peppers. Onions. Cucumbers. Brussels sprouts. Grains Whole grains, such as whole-wheat or whole-grain bread, crackers, tortillas, cereal, and pasta. Unsweetened oatmeal. Quinoa. Brown or wild rice. Meats and other proteins Seafood. Poultry without skin. Lean cuts of poultry and beef. Tofu. Nuts. Seeds. Dairy Low-fat or fat-free dairy products such as milk, yogurt, and cheese. The items listed above may not be a complete list of foods and beverages you can eat. Contact a dietitian for more information. What foods should I avoid? Fruits Fruits canned with  syrup. Vegetables Canned vegetables. Frozen vegetables with butter or cream sauce. Grains Refined white flour and flour products such as bread, pasta, snack foods, and cereals. Avoid all processed foods. Meats and other proteins Fatty cuts of meat. Poultry with skin. Breaded or fried meats. Processed meat. Avoid saturated fats. Dairy Full-fat yogurt, cheese, or milk. Beverages Sweetened drinks, such as soda or iced tea. The items listed above may not be a complete list of foods and beverages you should avoid. Contact a dietitian for more information. Questions to ask a health care provider  Do I need to meet with a diabetes educator?  Do I need to meet with a dietitian?  What number can I call if I have questions?  When are the best times to check my blood glucose? Where to find more information:  American Diabetes Association: diabetes.org  Academy of Nutrition and Dietetics: www.eatright.org  National Institute of Diabetes and Digestive and Kidney Diseases: www.niddk.nih.gov  Association of Diabetes Care and Education Specialists: www.diabeteseducator.org Summary  It is important to have healthy eating   habits because your blood sugar (glucose) levels are greatly affected by what you eat and drink.  A healthy meal plan will help you control your blood glucose and maintain a healthy lifestyle.  Your health care provider may recommend that you work with a dietitian to make a meal plan that is best for you.  Keep in mind that carbohydrates (carbs) and alcohol have immediate effects on your blood glucose levels. It is important to count carbs and to use alcohol carefully. This information is not intended to replace advice given to you by your health care provider. Make sure you discuss any questions you have with your health care provider. Document Revised: 06/25/2019 Document Reviewed: 06/25/2019 Elsevier Patient Education  2021 Elsevier Inc.  

## 2021-02-15 ENCOUNTER — Inpatient Hospital Stay: Payer: Medicaid Other | Admitting: Adult Health

## 2021-02-17 ENCOUNTER — Ambulatory Visit: Payer: Medicaid Other | Admitting: Nurse Practitioner

## 2021-03-03 ENCOUNTER — Other Ambulatory Visit: Payer: Self-pay | Admitting: Nurse Practitioner

## 2021-03-04 ENCOUNTER — Other Ambulatory Visit: Payer: Self-pay | Admitting: Nurse Practitioner

## 2021-03-04 NOTE — Telephone Encounter (Signed)
Should this be sent to PCP

## 2021-03-04 NOTE — Telephone Encounter (Signed)
Yes, I made them very aware that I would only do it temporarily until he could be seen by his PCP who would ultimately be responsible for all other refills

## 2021-03-09 ENCOUNTER — Other Ambulatory Visit: Payer: Self-pay

## 2021-03-09 ENCOUNTER — Encounter: Payer: Self-pay | Admitting: Adult Health

## 2021-03-09 ENCOUNTER — Ambulatory Visit: Payer: Medicaid Other | Admitting: Adult Health

## 2021-03-09 VITALS — BP 150/92 | HR 72 | Ht 67.0 in | Wt 145.0 lb

## 2021-03-09 DIAGNOSIS — R269 Unspecified abnormalities of gait and mobility: Secondary | ICD-10-CM

## 2021-03-09 DIAGNOSIS — G40219 Localization-related (focal) (partial) symptomatic epilepsy and epileptic syndromes with complex partial seizures, intractable, without status epilepticus: Secondary | ICD-10-CM

## 2021-03-09 DIAGNOSIS — I63512 Cerebral infarction due to unspecified occlusion or stenosis of left middle cerebral artery: Secondary | ICD-10-CM | POA: Diagnosis not present

## 2021-03-09 DIAGNOSIS — I69398 Other sequelae of cerebral infarction: Secondary | ICD-10-CM | POA: Diagnosis not present

## 2021-03-09 DIAGNOSIS — I69319 Unspecified symptoms and signs involving cognitive functions following cerebral infarction: Secondary | ICD-10-CM

## 2021-03-09 MED ORDER — HYDROCHLOROTHIAZIDE 25 MG PO TABS: 25.0000 mg | ORAL_TABLET | Freq: Every day | ORAL | 3 refills | Status: DC

## 2021-03-09 MED ORDER — TICAGRELOR 90 MG PO TABS: 90.0000 mg | ORAL_TABLET | Freq: Two times a day (BID) | ORAL | 5 refills | Status: DC

## 2021-03-09 MED ORDER — ISOSORBIDE MONONITRATE ER 30 MG PO TB24: 30.0000 mg | ORAL_TABLET | Freq: Every day | ORAL | 3 refills | Status: DC

## 2021-03-09 NOTE — Patient Instructions (Signed)
Start therapies at home for residual deficit - you will be called to schedule  Continue Brilinta (ticagrelor) 90 mg bid  and atorvastatin '80mg'$  daily  for secondary stroke prevention  Continue to follow up with PCP regarding cholesterol, blood pressure and diabetes management  Maintain strict control of hypertension with blood pressure goal below 130/90, diabetes with hemoglobin A1c goal below 7% and cholesterol with LDL cholesterol (bad cholesterol) goal below 70 mg/dL.   You will be contacted by Dr. Anette Guarneri office to schedule follow up visit - ongoing use of Brilinta will be determined by their office      Followup in the future with me in 4 months or call earlier if needed       Thank you for coming to see [place] at [facility]. I hope we have been able to provide you high quality care today.  You may receive a patient satisfaction survey over the next few weeks. We would appreciate your feedback and comments so that we may continue to improve ourselves and the health of our patients.

## 2021-03-09 NOTE — Progress Notes (Signed)
[facility] [address] [place]. [place] [address] [phone]       HOSPITAL FOLLOW UP NOTE  [name] Date of Birth:  10/05/1965 Medical Record Number:  [id number]   Reason for Referral:  hospital stroke follow up    SUBJECTIVE:   CHIEF COMPLAINT:  Chief Complaint  Patient presents with   Follow-up    RM 3 with sister Melvin Taylor Pt is well, sister states he has trouble with his memory, weakness on R side and tremors in hands.Marland Kitchen     HPI:   [name] is a 55 y.o. male with history of seizure on Depakote and Lamictal, CAD status post CABG, diabetes, hypertension, hyperlipidemia, OSA on CPAP, stage 3 CKD, substance abuse admitted to OSH on 12/16/2020 for seizure, right-sided weakness and slurred speech.  Personally reviewed hospitalization, pertinent progress notes, imaging and lab work.  NIHSS 14. No tPA d/t outside window.  CTA L M1 occlusion and CT perfusion 160cc at risk (although slightly overestimated).  Transferred to [facility] for emergent thrombectomy.  Complete stroke work-up revealed left MCA infarct due to left M1 occlusion s/p IR with TICI 3 reperfusion and L MCA stenting, secondary to large vessel disease.  Repeat CT head showed trace SAH postprocedure.  In addition to acute infarcts, MRI showed small chronic cortical infarcts in the right occipital lobe. MRA diffuse intracranial stenosis.  EF 65 to 70%.  LDL 116 - increased atorvastatin from '40mg'$  to '80mg'$  daily.  A1c 9.7.  Initiated Brilinta post stent, hx of asa allergy.  History of seizure disorder on Depakote and Lamictal with recent breakthrough seizure discharged on Depakote 1000 mg twice daily ('1500mg'$  BID PTA) and Lamictal 50 mg twice daily.  HTN stable long-term BP goal 130-150.  Current tobacco use.  UDS positive for THC.  Residual deficit of cognitive impairment, mild dysarthria, right-sided weakness.  Evaluated by therapies and discharged to CIR.  Today, 03/09/2021, Melvin Taylor is  being seen for hospital follow-up accompanied by his sister, Melvin Taylor.  Overall stable since discharge without new stroke/TIA symptoms.  Reports residual RUE weakness with gradual improvement although still has difficulty with use.  Denies residual RLE weakness.  Completed HH therapies.  Sister also reports short-term memory impairment which was present previously but worsened post stroke.  He is currently living with his aunt.  Previously living on his own but had an aide 6 days/week during the day.  Able to maintain ADLs independently.  Needs assistance for IADLs. Sister also reports generalized tremors but per patient, these have been present prior to his stroke.  They are usually worse in the morning and get better as the day goes on.  Compliant on Brilinta and atorvastatin 80 mg daily -denies side effects.  Blood pressure today 150/92.  Routinely monitors at home and typically stable.  Complete tobacco cessation since discharge.  No seizure activity with continued use of Depakote '1000mg'$  BID and Lamictal 50 mg twice daily. Sister reports likely medication noncompliance previously as they found multiple full bottles of medications in his home.  Family now provides assistance for medication administration.  No further concerns at this time.      ROS:   14 system review of systems performed and negative with exception of those listed in HPI  PMH:  Past Medical History:  Diagnosis Date   Anxiety    Bipolar 1 disorder ([place])    Bone spur    left heel   Cervical radiculopathy    Chest  pain    Chronic back pain    Chronic chest wall pain    Chronic neck pain    Coronary artery disease    a. s/p CABG in 03/2016 with LIMA-LAD, SVG-D1, SVG-RCA, and Seq SVG-mid and distal OM   Depression    Diabetes mellitus    Type II   Hallucinations    "long history of them"   Headache(784.0)    History of gout    HTN (hypertension)    Insomnia    Myocardial infarction ([place]) 2017   Neuropathy    Pain  management    Polysubstance abuse ([place])    Right leg pain    chronic   Schizophrenia ([place])    Seizures ([place])    last sz between July 5-9th, 2016; epilepsy   Shortness of breath dyspnea    with exertion   Sleep apnea    Transfusion of blood product refused for religious reason     PSH:  Past Surgical History:  Procedure Laterality Date   ANTERIOR CERVICAL DECOMP/DISCECTOMY FUSION N/A 12/14/2015   Procedure: ANTERIOR CERVICAL DECOMPRESSION/DISCECTOMY FUSION CERVICAL FIVE -SIX;  Surgeon: Earnie Larsson, MD;  Location: [facility];  Service: Neurosurgery;  Laterality: N/A;   BIOPSY  05/07/2015   Procedure: BIOPSY (Gastric);  Surgeon: [name], MD;  Location: [facility];  Service: Endoscopy;;   CARDIAC CATHETERIZATION N/A 03/07/2016   Procedure: Left Heart Cath and Coronary Angiography;  Surgeon: Belva Crome, MD;  Location: [place] [facility];  Service: Cardiovascular;  Laterality: N/A;   COLONOSCOPY WITH PROPOFOL N/A 05/07/2015   [id number] diverticulosis, multiple colon polyps removed, tubular adenoma, serrated colon polyp. Next colonoscopy October 2019   COLONOSCOPY WITH PROPOFOL N/A 08/02/2018   Procedure: COLONOSCOPY WITH PROPOFOL;  Surgeon: [name], MD;  Location: [facility];  Service: Endoscopy;  Laterality: N/A;  12:00pm   CORONARY ARTERY BYPASS GRAFT N/A 03/28/2016   Procedure: CORONARY ARTERY BYPASS GRAFTING (CABG) x 5 USING GREATER SAPHENOUS VEIN;  Surgeon: [name], MD;  Location: [place] OR;  Service: Open Heart Surgery;  Laterality: N/A;   ENDOVEIN HARVEST OF GREATER SAPHENOUS VEIN Right 03/28/2016   Procedure: ENDOVEIN HARVEST OF GREATER SAPHENOUS VEIN;  Surgeon: [name], MD;  Location: [place];  Service: Open Heart Surgery;  Laterality: Right;   ESOPHAGEAL DILATION N/A 05/07/2015   Procedure: ESOPHAGEAL DILATION WITH 56FR MALONEY DILATOR;  Surgeon: [name], MD;  Location: [facility];  Service: Endoscopy;  Laterality: N/A;   ESOPHAGOGASTRODUODENOSCOPY (EGD)  WITH PROPOFOL N/A 05/07/2015   RMR: Status post dilation of normal esophagus. Gastritis.   IR CT HEAD LTD  12/17/2020   IR CT HEAD LTD  12/17/2020   IR INTRA CRAN STENT  12/17/2020   IR PERCUTANEOUS ART THROMBECTOMY/INFUSION INTRACRANIAL INC DIAG ANGIO  12/17/2020   KNEE SURGERY Left    arthroscopy   MANDIBLE FRACTURE SURGERY     POLYPECTOMY  05/07/2015   Procedure: POLYPECTOMY (Hepatic Flexure, Distal Transverse Colon, Rectal);  Surgeon: [name], MD;  Location: [facility];  Service: Endoscopy;;   RADIOLOGY WITH ANESTHESIA N/A 12/16/2020   Procedure: IR WITH ANESTHESIA;  Surgeon: Luanne Bras, MD;  Location: [place];  Service: Radiology;  Laterality: N/A;   TEE WITHOUT CARDIOVERSION N/A 03/28/2016   Procedure: TRANSESOPHAGEAL ECHOCARDIOGRAM (TEE);  Surgeon: [name], MD;  Location: [place];  Service: Open Heart Surgery;  Laterality: N/A;   TONSILLECTOMY      Social History:  Social History  Socioeconomic History   Marital status: Single    Spouse name: Not on file   Number of children: Not on file   Years of education: 11th grade   Highest education level: Not on file  Occupational History   Occupation: unemployed    Employer: NOT EMPLOYED  Tobacco Use   Smoking status: Former    Packs/day: 0.50    Years: 30.00    Pack years: 15.00    Types: Cigarettes   Smokeless tobacco: Never   Tobacco comments:    3 cigs per day  Vaping Use   Vaping Use: Never used  Substance and Sexual Activity   Alcohol use: Not Currently   Drug use: Not Currently    Types: Marijuana   Sexual activity: Not Currently  Other Topics Concern   Not on file  Social History Narrative   Lives alone   Drinks a cup of coffee a day   Social Determinants of Health   Financial Resource Strain: Not on file  Food Insecurity: Not on file  Transportation Needs: Not on file  Physical Activity: Not on file  Stress: Not on file  Social Connections: Not on file  Intimate Partner Violence: Not on file     Family History:  Family History  Problem Relation Age of Onset   Diabetes Father    Hypertension Father    Arthritis Other    Diabetes Other    Asthma Other    Stroke Sister     Medications:   Current Outpatient Medications on File Prior to Visit  Medication Sig Dispense Refill   albuterol (PROVENTIL HFA;VENTOLIN HFA) 108 (90 Base) MCG/ACT inhaler Inhale 2 puffs into the lungs every 4 (four) hours as needed for wheezing or shortness of breath.      amLODipine (NORVASC) 10 MG tablet Take 1 tablet (10 mg total) by mouth daily. 90 tablet 0   atorvastatin (LIPITOR) 80 MG tablet Take 1 tablet (80 mg total) by mouth at bedtime. 30 tablet 0   cycloSPORINE (RESTASIS) 0.05 % ophthalmic emulsion Place 1 drop into both eyes 2 (two) times daily.     divalproex (DEPAKOTE) 500 MG DR tablet Take 2 tablets (1,000 mg total) by mouth every 12 (twelve) hours.     escitalopram (LEXAPRO) 20 MG tablet Take 20 mg by mouth daily.      glimepiride (AMARYL) 2 MG tablet Take 1 tablet (2 mg total) by mouth daily with breakfast. 90 tablet 3   lamoTRIgine (LAMICTAL) 25 MG tablet Take 50 mg by mouth 2 (two) times daily.     LANTUS SOLOSTAR 100 UNIT/ML Solostar Pen INJECT 40 UNITS INTO THE SKIN AT BEDTIME. 15 mL 0   Melatonin 10 MG CAPS Take 10 capsules by mouth at bedtime as needed (for sleep).     Multiple Vitamin (MULTIVITAMIN WITH MINERALS) TABS tablet Take 1 tablet by mouth daily.     pantoprazole (PROTONIX) 40 MG tablet Take 1 tablet (40 mg total) by mouth daily.     ranolazine (RANEXA) 500 MG 12 hr tablet Take 500 mg by mouth 2 (two) times daily.     senna-docusate (SENOKOT-S) 8.6-50 MG tablet Take 1 tablet by mouth at bedtime as needed for mild constipation or moderate constipation.     nitroGLYCERIN (NITROSTAT) 0.4 MG SL tablet Place 1 tablet (0.4 mg total) under the tongue every 5 (five) minutes as needed for chest pain. 25 tablet 3   No current facility-administered medications on file prior to visit.  Allergies:   Allergies  Allergen Reactions   Aspirin Swelling and Other (See Comments)    Facial swelling       OBJECTIVE:  Physical Exam  Vitals:   03/09/21 1415  BP: (!) 150/92  Pulse: 72  Weight: 145 lb (65.8 kg)  Height: '5\' 7"'$  (1.702 m)   Body mass index is 22.71 kg/m. No results found.   Post stroke PHQ 2/9 Depression screen PHQ 2/9 03/10/2021  Decreased Interest 0  Down, Depressed, Hopeless 0  PHQ - 2 Score 0  Altered sleeping -  Tired, decreased energy -  Change in appetite -  Feeling bad or failure about yourself  -  Trouble concentrating -  Moving slowly or fidgety/restless -  Suicidal thoughts -  PHQ-9 Score -  Some recent data might be hidden     General: well developed, well nourished, pleasant middle-aged African-American male, seated, in no evident distress Head: head normocephalic and atraumatic.   Neck: supple with no carotid or supraclavicular bruits Cardiovascular: regular rate and rhythm, no murmurs Musculoskeletal: no deformity Skin:  no rash/petichiae Vascular:  Normal pulses all extremities   Neurologic Exam Mental Status: Awake and fully alert.  Fluent speech and language.  Oriented to place and time. Recent and remote memory impaired.  Attention span, concentration and fund of knowledge impaired.  Sister provides majority of history.  Mood and affect appropriate.  Cranial Nerves: Fundoscopic exam reveals sharp disc margins. Pupils equal, briskly reactive to light. Extraocular movements full without nystagmus. Visual fields full to confrontation. Hearing intact. Facial sensation intact. Face, tongue, palate moves normally and symmetrically.  Motor: Normal bulk and tone. Normal strength in all tested extremity muscles except decreased right hand dexterity Sensory.: intact to touch , pinprick , position and vibratory sensation.  Coordination: Rapid alternating movements normal in all extremities except slightly decreased right hand  dexterity. Finger-to-nose mild RUE ataxia and heel-to-shin performed accurately bilaterally.  Mild tremor noted in all extremities with activation.  No evidence of resting tremor or cogwheel rigidity Gait and Station: Arises from chair without difficulty. Stance is normal. Gait demonstrates normal stride length and balance with with slight favoring of right leg Reflexes: 1+ and symmetric. Toes downgoing.     NIHSS  0 Modified Rankin  3      ASSESSMENT: JAKERYAN BURNET is a 55 y.o. year old male with left MCA infarct on 12/16/2020 due to left M1 occlusion s/p IR with stenting secondary to ICAD after presenting with right-sided weakness and slurred speech.  Vascular risk factors include HTN, HLD, DM, CAD s/p CABG, hx of substance abuse, prior stroke on imaging (R occipital lobe), OSA and seizures on Depakote and Lamictal.      PLAN:  L MCA stroke:  Residual deficit: RUE weakness, ataxia, gait impairment and worsening baseline cognition. Referral placed for [facility] PT/OT/SLP due to continued deficits Needs f/u with Dr. Estanislado Pandy - will send message to IR requesting they contact his sister Melvin Taylor ([id number]) to schedule f/u visit Continue Brilinta (ticagrelor) 90 mg bid  and atorvastatin 80 mg daily for secondary stroke prevention.   Discussed secondary stroke prevention measures and importance of close PCP follow up for aggressive stroke risk factor management. I have gone over the pathophysiology of stroke, warning signs and symptoms, risk factors and their management in some detail with instructions to go to the closest emergency room for symptoms of concern. HTN: BP goal <130/90.  Stable on current regimen per PCP - BP meds refilled -ongoing refills  will need to be obtained by PCP as these will likely be long-term medications HLD: LDL goal <70. Recent LDL 116 -continue atorvastatin 80 mg daily.  DMII: A1c goal<7.0. Recent A1c 9.7.  Continue to follow-up with PCP for close monitoring  management Seizure disorder: On Depakote and Lamictal followed by Dr. Leta Baptist.  Depakote dosage recently decreased -longstanding generalized tremors may be a potential side effect - will see if they improve with recent dose adjustment.  Depakote level 71 on 12/22/2020.     Follow up in 4 months or call earlier if needed   CC:  [facility] provider: Dr. Leonie Man PCP: [name], FNP    I spent 59 minutes of face-to-face and non-face-to-face time with patient and sister.  This included previsit chart review including review of recent hospitalization, lab review, study review, order entry, electronic health record documentation, patient and sister education regarding recent stroke and likely etiology, residual deficits, secondary stroke prevention measures and importance of managing stroke risk factors, seizure disorder and answered all other questions to patient and sisters satisfaction   [name], AGNP-BC  [facility] [address] [place], [place] [address]  Phone [phone] Fax [phone] Note: This document was prepared with digital dictation and possible smart phrase technology. Any transcriptional errors that result from this process are unintentional.

## 2021-03-11 ENCOUNTER — Encounter: Payer: Self-pay | Admitting: Adult Health

## 2021-03-15 ENCOUNTER — Telehealth (HOSPITAL_COMMUNITY): Payer: Self-pay

## 2021-03-15 NOTE — Telephone Encounter (Signed)
Called pt's sister to schedule f/u with Dr. Estanislado Pandy, no answer, left vm. AW

## 2021-03-15 NOTE — Progress Notes (Signed)
I agree with the above plan 

## 2021-03-23 ENCOUNTER — Telehealth: Payer: Self-pay | Admitting: Adult Health

## 2021-03-23 ENCOUNTER — Ambulatory Visit (HOSPITAL_COMMUNITY)
Admission: RE | Admit: 2021-03-23 | Discharge: 2021-03-23 | Disposition: A | Discharge status: Home / Self Care | Payer: Medicaid Other | Source: Ambulatory Visit | Attending: Student | Admitting: Student

## 2021-03-23 ENCOUNTER — Other Ambulatory Visit: Payer: Self-pay

## 2021-03-23 DIAGNOSIS — I6602 Occlusion and stenosis of left middle cerebral artery: Secondary | ICD-10-CM

## 2021-03-23 DIAGNOSIS — I69319 Unspecified symptoms and signs involving cognitive functions following cerebral infarction: Secondary | ICD-10-CM

## 2021-03-23 DIAGNOSIS — I69398 Other sequelae of cerebral infarction: Secondary | ICD-10-CM

## 2021-03-23 DIAGNOSIS — I63512 Cerebral infarction due to unspecified occlusion or stenosis of left middle cerebral artery: Secondary | ICD-10-CM

## 2021-03-23 NOTE — Telephone Encounter (Signed)
Contacted pt sister, per DPR, informed her that the physical therapy orders was placed on 03/11/21 and we are waiting for referrals to get everything in order and placed. Also advised that it is up to the pts discretion as far as who they would like care to be from as far as PC goes. It would be best to look at PCP/internal med in the area and pick someone who is the best fit for them that she likes the best.  She understood and was appreciative of the call back.

## 2021-03-23 NOTE — Telephone Encounter (Signed)
[name] Melvin Taylor called stating she has not received a call about physical therapy for her brother and also wanting some help on finding a new PCP for her brother. Sis is requesting a call back.

## 2021-03-25 ENCOUNTER — Other Ambulatory Visit: Payer: Self-pay

## 2021-03-25 HISTORY — PX: IR RADIOLOGIST EVAL & MGMT: IMG5224

## 2021-03-25 NOTE — Patient Outreach (Signed)
[name]  03/25/2021  Melvin Taylor 1966/01/02 [id number]   No Telephone outreach to patient to obtain mRS as was successfully obtained by Dr Darden Dates on 03/09/21. MRS= 3   [place] Care Management Assistant

## 2021-03-30 ENCOUNTER — Encounter: Payer: Self-pay | Admitting: "Endocrinology

## 2021-03-30 ENCOUNTER — Telehealth: Payer: Self-pay | Admitting: Podiatry

## 2021-03-30 ENCOUNTER — Ambulatory Visit (INDEPENDENT_AMBULATORY_CARE_PROVIDER_SITE_OTHER): Payer: Medicaid Other | Admitting: "Endocrinology

## 2021-03-30 VITALS — BP 126/84 | HR 76 | Ht 67.0 in | Wt 147.0 lb

## 2021-03-30 DIAGNOSIS — E1159 Type 2 diabetes mellitus with other circulatory complications: Secondary | ICD-10-CM | POA: Diagnosis not present

## 2021-03-30 DIAGNOSIS — E782 Mixed hyperlipidemia: Secondary | ICD-10-CM

## 2021-03-30 DIAGNOSIS — I10 Essential (primary) hypertension: Secondary | ICD-10-CM | POA: Diagnosis not present

## 2021-03-30 LAB — POCT GLYCOSYLATED HEMOGLOBIN (HGB A1C): HbA1c, POC (controlled diabetic range): 6.6 % (ref 0.0–7.0)

## 2021-03-30 MED ORDER — ACCU-CHEK GUIDE VI STRP: ORAL_STRIP | 2 refills | Status: DC

## 2021-03-30 MED ORDER — ACCU-CHEK GUIDE ME W/DEVICE KIT: 1.0000 | PACK | 0 refills | Status: DC

## 2021-03-30 MED ORDER — GLIMEPIRIDE 2 MG PO TABS: 2.0000 mg | ORAL_TABLET | Freq: Every day | ORAL | 1 refills | Status: DC

## 2021-03-30 NOTE — Telephone Encounter (Signed)
Pts sister left message stating she contacted [facility] about pt getting diabetic shoes and they did not have a prescription from [place] for pt. Can you please write the rx and I can fax it.

## 2021-03-30 NOTE — Progress Notes (Signed)
03/30/2021   Endocrinology follow-up note   Subjective:    Patient ID: Melvin Taylor, male    DOB: April 08, 1966, PCP [name], FNP   Past Medical History:  Diagnosis Date   Anxiety    Bipolar 1 disorder ([place])    Bone spur    left heel   Cervical radiculopathy    Chest pain    Chronic back pain    Chronic chest wall pain    Chronic neck pain    Coronary artery disease    a. s/p CABG in 03/2016 with LIMA-LAD, SVG-D1, SVG-RCA, and Seq SVG-mid and distal OM   Depression    Diabetes mellitus    Type II   Hallucinations    "long history of them"   Headache(784.0)    History of gout    HTN (hypertension)    Insomnia    Myocardial infarction ([place]) 2017   Neuropathy    Pain management    Polysubstance abuse ([place])    Right leg pain    chronic   Schizophrenia ([place])    Seizures ([place])    last sz between July 5-9th, 2016; epilepsy   Shortness of breath dyspnea    with exertion   Sleep apnea    Transfusion of blood product refused for religious reason    Past Surgical History:  Procedure Laterality Date   ANTERIOR CERVICAL DECOMP/DISCECTOMY FUSION N/A 12/14/2015   Procedure: ANTERIOR CERVICAL DECOMPRESSION/DISCECTOMY FUSION CERVICAL FIVE -SIX;  Surgeon: Earnie Larsson, MD;  Location: [facility];  Service: Neurosurgery;  Laterality: N/A;   BIOPSY  05/07/2015   Procedure: BIOPSY (Gastric);  Surgeon: [name], MD;  Location: [facility];  Service: Endoscopy;;   CARDIAC CATHETERIZATION N/A 03/07/2016   Procedure: Left Heart Cath and Coronary Angiography;  Surgeon: Belva Crome, MD;  Location: [place] [facility];  Service: Cardiovascular;  Laterality: N/A;   COLONOSCOPY WITH PROPOFOL N/A 05/07/2015   EHM:[id number] diverticulosis, multiple colon polyps removed, tubular adenoma, serrated colon polyp. Next colonoscopy October 2019   COLONOSCOPY WITH PROPOFOL N/A 08/02/2018   Procedure: COLONOSCOPY WITH PROPOFOL;  Surgeon: [name], MD;  Location: [facility];  Service:  Endoscopy;  Laterality: N/A;  12:00pm   CORONARY ARTERY BYPASS GRAFT N/A 03/28/2016   Procedure: CORONARY ARTERY BYPASS GRAFTING (CABG) x 5 USING GREATER SAPHENOUS VEIN;  Surgeon: [name], MD;  Location: [facility];  Service: Open Heart Surgery;  Laterality: N/A;   ENDOVEIN HARVEST OF GREATER SAPHENOUS VEIN Right 03/28/2016   Procedure: ENDOVEIN HARVEST OF GREATER SAPHENOUS VEIN;  Surgeon: [name], MD;  Location: [facility];  Service: Open Heart Surgery;  Laterality: Right;   ESOPHAGEAL DILATION N/A 05/07/2015   Procedure: ESOPHAGEAL DILATION WITH 56FR MALONEY DILATOR;  Surgeon: [name], MD;  Location: [facility];  Service: Endoscopy;  Laterality: N/A;   ESOPHAGOGASTRODUODENOSCOPY (EGD) WITH PROPOFOL N/A 05/07/2015   RMR: Status post dilation of normal esophagus. Gastritis.   IR CT HEAD LTD  12/17/2020   IR CT HEAD LTD  12/17/2020   IR INTRA CRAN STENT  12/17/2020   IR PERCUTANEOUS ART THROMBECTOMY/INFUSION INTRACRANIAL INC DIAG ANGIO  12/17/2020   IR RADIOLOGIST EVAL & MGMT  03/25/2021   KNEE SURGERY Left    arthroscopy   MANDIBLE FRACTURE SURGERY     POLYPECTOMY  05/07/2015   Procedure: POLYPECTOMY (Hepatic Flexure, Distal Transverse Colon, Rectal);  Surgeon: [name], MD;  Location: [facility];  Service: Endoscopy;;   RADIOLOGY WITH  ANESTHESIA N/A 12/16/2020   Procedure: IR WITH ANESTHESIA;  Surgeon: Luanne Bras, MD;  Location: [place];  Service: Radiology;  Laterality: N/A;   TEE WITHOUT CARDIOVERSION N/A 03/28/2016   Procedure: TRANSESOPHAGEAL ECHOCARDIOGRAM (TEE);  Surgeon: [name], MD;  Location: [place];  Service: Open Heart Surgery;  Laterality: N/A;   TONSILLECTOMY     Social History   Socioeconomic History   Marital status: Single    Spouse name: Not on file   Number of children: Not on file   Years of education: 11th grade   Highest education level: Not on file  Occupational History   Occupation: unemployed    Employer: NOT EMPLOYED  Tobacco Use   Smoking  status: Former    Packs/day: 0.50    Years: 30.00    Pack years: 15.00    Types: Cigarettes   Smokeless tobacco: Never   Tobacco comments:    3 cigs per day  Vaping Use   Vaping Use: Never used  Substance and Sexual Activity   Alcohol use: Not Currently   Drug use: Not Currently    Types: Marijuana   Sexual activity: Not Currently  Other Topics Concern   Not on file  Social History Narrative   Lives alone   Drinks a cup of coffee a day   Social Determinants of Health   Financial Resource Strain: Not on file  Food Insecurity: Not on file  Transportation Needs: Not on file  Physical Activity: Not on file  Stress: Not on file  Social Connections: Not on file   Outpatient Encounter Medications as of 03/30/2021  Medication Sig   Blood Glucose Monitoring Suppl (ACCU-CHEK GUIDE ME) w/Device KIT 1 Piece by Does not apply route as directed.   glucose blood (ACCU-CHEK GUIDE) test strip Monitor glucose 2 times a day.   albuterol (PROVENTIL HFA;VENTOLIN HFA) 108 (90 Base) MCG/ACT inhaler Inhale 2 puffs into the lungs every 4 (four) hours as needed for wheezing or shortness of breath.    amLODipine (NORVASC) 10 MG tablet Take 1 tablet (10 mg total) by mouth daily.   atorvastatin (LIPITOR) 80 MG tablet Take 1 tablet (80 mg total) by mouth at bedtime.   cycloSPORINE (RESTASIS) 0.05 % ophthalmic emulsion Place 1 drop into both eyes 2 (two) times daily.   divalproex (DEPAKOTE) 500 MG DR tablet Take 2 tablets (1,000 mg total) by mouth every 12 (twelve) hours.   escitalopram (LEXAPRO) 20 MG tablet Take 20 mg by mouth daily.    glimepiride (AMARYL) 2 MG tablet Take 1 tablet (2 mg total) by mouth daily with breakfast.   hydrochlorothiazide (HYDRODIURIL) 25 MG tablet Take 1 tablet (25 mg total) by mouth daily.   isosorbide mononitrate (IMDUR) 30 MG 24 hr tablet Take 1 tablet (30 mg total) by mouth daily.   lamoTRIgine (LAMICTAL) 25 MG tablet Take 50 mg by mouth 2 (two) times daily.   LANTUS  SOLOSTAR 100 UNIT/ML Solostar Pen INJECT 40 UNITS INTO THE SKIN AT BEDTIME.   Melatonin 10 MG CAPS Take 10 capsules by mouth at bedtime as needed (for sleep).   Multiple Vitamin (MULTIVITAMIN WITH MINERALS) TABS tablet Take 1 tablet by mouth daily.   nitroGLYCERIN (NITROSTAT) 0.4 MG SL tablet Place 1 tablet (0.4 mg total) under the tongue every 5 (five) minutes as needed for chest pain.   pantoprazole (PROTONIX) 40 MG tablet Take 1 tablet (40 mg total) by mouth daily. (Patient taking differently: Take 40 mg by mouth daily as needed.)  senna-docusate (SENOKOT-S) 8.6-50 MG tablet Take 1 tablet by mouth at bedtime as needed for mild constipation or moderate constipation.   ticagrelor (BRILINTA) 90 MG TABS tablet Take 1 tablet (90 mg total) by mouth 2 (two) times daily.   [DISCONTINUED] glimepiride (AMARYL) 2 MG tablet Take 1 tablet (2 mg total) by mouth daily with breakfast.   [DISCONTINUED] glimepiride (AMARYL) 2 MG tablet Take 1 tablet (2 mg total) by mouth daily with breakfast.   [DISCONTINUED] ranolazine (RANEXA) 500 MG 12 hr tablet Take 500 mg by mouth 2 (two) times daily.   No facility-administered encounter medications on file as of 03/30/2021.   ALLERGIES: Allergies  Allergen Reactions   Aspirin Swelling and Other (See Comments)    Facial swelling    VACCINATION STATUS: Immunization History  Administered Date(s) Administered   Influenza,trivalent, recombinat, inj, PF 05/02/2015   Moderna Sars-Covid-2 Vaccination 11/01/2019, 12/03/2019   Pneumococcal Polysaccharide-23 12/22/2020   Tdap 01/18/2015    Diabetes He presents for his follow-up diabetic visit. He has type 2 diabetes mellitus. Onset time: He was diagnosed the approximate age of 52 years. His disease course has been improving. There are no hypoglycemic associated symptoms. Pertinent negatives for hypoglycemia include no confusion, headaches, pallor or seizures. Associated symptoms include polydipsia and polyuria. Pertinent  negatives for diabetes include no chest pain, no fatigue, no polyphagia and no weakness. There are no hypoglycemic complications. Symptoms are improving. Diabetic complications include heart disease. (He is seriously noncompliant patient. He is now diagnosed with coronary artery disease at [facility] since last visit. He is awaiting for his outpatient cardiology evaluation. This assay to be set for 03/24/2016.) Risk factors for coronary artery disease include diabetes mellitus, dyslipidemia, hypertension, male sex, tobacco exposure and sedentary lifestyle. Current diabetic treatment includes insulin injections. He is compliant with treatment none of the time. His weight is increasing steadily. He is following a generally unhealthy diet. When asked about meal planning, he reported none. He has had a previous visit with a dietitian. He never participates in exercise. His home blood glucose trend is decreasing steadily. His breakfast blood glucose range is generally 140-180 mg/dl. His lunch blood glucose range is generally 140-180 mg/dl. His dinner blood glucose range is generally 140-180 mg/dl. His bedtime blood glucose range is generally 140-180 mg/dl. His overall blood glucose range is 140-180 mg/dl. (He is accompanied by his sister to clinic today.  Since last visit, he has been under the care of his family including his sister.  He presents with controlled glycemic profile to target levels.  His point-of-care A1c is 6.6%, significantly improving from 12.7% A1c during his last visit.  He is currently only on Lantus and glimepiride.  ) An ACE inhibitor/angiotensin II receptor blocker is not being taken.  Hyperlipidemia This is a chronic problem. The current episode started more than 1 year ago. Exacerbating diseases include diabetes. Pertinent negatives include no chest pain, myalgias or shortness of breath. Current antihyperlipidemic treatment includes statins. Risk factors for coronary artery disease  include diabetes mellitus, dyslipidemia, hypertension, a sedentary lifestyle and male sex.  Hypertension Pertinent negatives include no chest pain, headaches, neck pain, palpitations or shortness of breath. Risk factors for coronary artery disease include smoking/tobacco exposure, diabetes mellitus, dyslipidemia and male gender.    Review of systems  Constitutional: + Appropriately regaining some of his weight.    current  Body mass index is 23.02 kg/m. , no fatigue, no subjective hyperthermia, no subjective hypothermia Musculoskeletal: He is status post left foot surgery,  plantar fasciitis, bone spur removal    Objective:    BP 126/84   Pulse 76   Ht 5' 7"  (1.702 m)   Wt 147 lb (66.7 kg)   BMI 23.02 kg/m   Wt Readings from Last 3 Encounters:  03/30/21 147 lb (66.7 kg)  03/09/21 145 lb (65.8 kg)  02/10/21 143 lb (64.9 kg)      Physical Exam- Limited  Constitutional:  Body mass index is 23.02 kg/m. , not in acute distress, + dysphoric state of mind Eyes:  EOMI, no exophthalmos Neck: Supple  Musculoskeletal: Left foot in surgical boot status post recent surgery for plantar fasciitis/bone spur removal.  Chemistry (most recent): Lab Results  Component Value Date   NA 136 02/09/2021   K 4.0 02/09/2021   CL 102 02/09/2021   CO2 27 02/09/2021   BUN 33 (H) 02/09/2021   CREATININE 1.91 (H) 02/09/2021   Diabetic Labs (most recent): Lab Results  Component Value Date   HGBA1C 6.6 03/30/2021   HGBA1C 9.7 (H) 12/17/2020   HGBA1C 12.7 08/25/2020   Lipid Panel     Component Value Date/Time   CHOL 224 (H) 12/17/2020 0254   CHOL 286 (H) 02/12/2020 1334   TRIG 146 12/17/2020 0254   HDL 79 12/17/2020 0254   HDL 118 02/12/2020 1334   CHOLHDL 2.8 12/17/2020 0254   VLDL 29 12/17/2020 0254   LDLCALC 116 (H) 12/17/2020 0254   LDLCALC 145 (H) 02/12/2020 1334    Assessment & Plan:   1. Uncontrolled type 2 diabetes mellitus with complication, with long-term current use of  insulin (HCC)  - diabetes is  complicated by Coronary artery disease , CKD , chronic heavy smoking, noncompliance and patient remains at a high risk for more acute and chronic complications of diabetes which include CAD, CVA, CKD, retinopathy, and neuropathy. These are all discussed in detail with the patient.  He is accompanied by his sister to clinic today.  Since last visit, he has been under the care of his family including his sister.  He presents with controlled glycemic profile to target levels.  His point-of-care A1c is 6.6%, significantly improving from 12.7% A1c during his last visit.  He is currently only on Lantus and glimepiride.    -I had a long discussion with him about treatment course of type 2 diabetes in the pathology behind its complications. - I have re-counseled the patient on diet management   by adopting a carbohydrate restricted / protein rich  Diet.  - he acknowledges that there is a room for improvement in his food and drink choices. - Suggestion is made for him to avoid simple carbohydrates  from his diet including Cakes, Sweet Desserts, Ice Cream, Soda (diet and regular), Sweet Tea, Candies, Chips, Cookies, Store Bought Juices, Alcohol in Excess of  1-2 drinks a day, Artificial Sweeteners,  Coffee Creamer, and "Sugar-free" Products, Lemonade. This will help patient to have more stable blood glucose profile and potentially avoid unintended weight gain.   - Patient is advised to stick to a routine mealtimes to eat 3 meals  a day and avoid unnecessary snacks ( to snack only to correct hypoglycemia).   - I have approached patient with the following individualized plan to manage diabetes and patient reluctantly accepts .  -Under his current care arrangement with his sister, he has done very well.  He presents with his best A1c of 6.6% remarkably improving from 12.7% during his last visit.   -He is advised to  stay put, since he is not going to do well where he lives alone.   He is advised to continue Lantus 40 units nightly, stop NovoLog.  He will be continued on glimepiride 2 mg p.o. daily at breakfast.  He is approached to continue monitoring blood glucose at least twice a day-daily before breakfast and at bedtime. -He is warned not to take insulin without proper monitoring of blood glucose. -He is encouraged to call clinic for blood glucose readings less than 70 or greater than 200x3.  - He is not a suitable candidate for incretin therapy nor metformin/SG LT 2 inhibitors . - Patient specific target  for A1c; LDL, HDL, Triglycerides,  were discussed in detail.  2) BP/HTN:  -His blood pressure is controlled to target. He is advised to continue lisinopril 20 mg p.o. daily, amlodipine 10 mg p.o. daily, hydrochlorothiazide 25 mg p.o. daily.  See below as far as smoking.    3)  Hyperlipidemia: His most recent lipid panel showed LDL at 75.  He will continue to benefit from statin intervention.  He is advised to continue atorvastatin 40 mg p.o. nightly.       4) Medical noncompliance: He has significant medical history of noncompliance.  He is advised to maintain his current set up of management with his family.      5) Chronic Care/Health Maintenance:  -Patient  Is on Statin medications and encouraged to continue to follow up with Ophthalmology, Podiatrist at least yearly or according to recommendations, and advised to quit smoking. I have recommended yearly flu vaccine and pneumonia vaccination at least every 5 years; moderate intensity exercise for up to 150 minutes weekly; and  sleep for at least 7 hours a day.  The patient was counseled on the dangers of tobacco use, and was advised to quit.  Reviewed strategies to maximize success, including removing cigarettes and smoking materials from environment.   He is advised to continue follow-up with his PMD for primary care needs.    I spent 34 minutes in the care of the patient today including review of labs from  [place], Lipids, Thyroid Function, Hematology (current and previous including abstractions from other facilities); face-to-face time discussing  his blood glucose readings/logs, discussing hypoglycemia and hyperglycemia episodes and symptoms, medications doses, his options of short and long term treatment based on the latest standards of care / guidelines;  discussion about incorporating lifestyle medicine;  and documenting the encounter.    Please refer to Patient Instructions for Blood Glucose Monitoring and Insulin/Medications Dosing Guide"  in media tab for additional information. Please  also refer to " Patient Self Inventory" in the Media  tab for reviewed elements of pertinent patient history.  Lendon Ka Kanady participated in the discussions, expressed understanding, and voiced agreement with the above plans.  All questions were answered to his satisfaction. he is encouraged to contact clinic should he have any questions or concerns prior to his return visit.    Follow up plan: -Return in about 4 months (around 07/30/2021) for Bring Meter and Logs- A1c in Office.  Glade Lloyd, MD Phone: [phone]  Fax: [phone]   03/30/2021, 5:36 PM

## 2021-03-30 NOTE — Patient Instructions (Signed)

## 2021-03-31 ENCOUNTER — Telehealth: Payer: Self-pay | Admitting: "Endocrinology

## 2021-03-31 ENCOUNTER — Telehealth: Payer: Self-pay

## 2021-03-31 NOTE — Telephone Encounter (Signed)
They have been giving 5 units since June. [name] is asking if she can give less than 5 units since he has had lows in the mornings even when he eats a snack with the 5 units.

## 2021-03-31 NOTE — Telephone Encounter (Signed)
Discussed with pt's sister, understanding voiced. 

## 2021-03-31 NOTE — Telephone Encounter (Signed)
Melvin Taylor) called back to let us know that this patient has only been getting 5 units of Lantus at night when he needed it and they have been doing this since he was released from the hospital on 12/30/2020 (documented on discharge notes). Melvin Haw wants to know if they can drop the dosage to lower because they gave the patient 5 units last night and he did eat a snack and his blood sugar this morning was 53. Please advise.

## 2021-03-31 NOTE — Telephone Encounter (Signed)
Pt sister Estill Batten called and states they did everything last night that they were told to do and this morning patient sugar was 55

## 2021-04-01 NOTE — Telephone Encounter (Signed)
I called [name] and she is going to find out what the patients blood glucose was this morning and also what kinds of snacks the patient is eating with his dose of Lantus. [name] stated that if the patient is already in a lower blood glucose range below 100 they do not give him the Lantus. If the patient has a higher blood glucose reading then they will give him the Lantus. [name] will call back with more detail.

## 2021-04-01 NOTE — Telephone Encounter (Signed)
Can patient give less that 5 units of Lantus because this amount is causing low blood glucose readings? And if so how much should he give?

## 2021-04-01 NOTE — Telephone Encounter (Signed)
[name] called back and stated that her aunt gave the patient 2 units of Lantus last night and the patient had a snack of a package of peanut butter crackers and this morning his blood sugar was 75. [name] stated that the patient has been having several low readings even when they did not give the patient the nightly dose of Lantus. [name] stated that she will stop the Lantus for now and will call us if the patient has any more low blood glucose readings or if he has elevated glucose readings.

## 2021-04-07 NOTE — Progress Notes (Signed)
Cardiology Office Note    Date:  04/13/2021   ID:  Melvin Taylor, DOB 05/01/1966, MRN [id number]  PCP:  Melvin Robert, FNP  Cardiologist: Kate Sable, MD (Inactive)  New to me      History of Present Illness:    Melvin Taylor is a 55 y.o. male  new to me previously seen by Dr Robbi Garter of CAD (s/p CABG in 03/2016 with LIMA-LAD, SVG-D1, SVG-RCA, and Seq SVG-mid and distal OM), HTN, HLD, IDDM, OSA, Bipolar Disorder, Schizophrenia and tobacco use   TTE 12/17/20 EF 65-70%   Present to [facility] ED on 09/14/2020 for evaluation of recurrent chest pain. Troponin values were negative and EKG was without acute changes. He was started on Imdur 30 mg daily and discharged home. His stress test was performed on 09/21/2020 and showed normal resting and stress perfusion with no evidence of ischemia and was overall a low risk study.  Thinks Ranexa has caused some nausea Some palpitations brief skips with high caffeine intake both coffee and soda Activity limited by left knee pain   Admitted with stroke 12/21/20-12/30/20 right sided weakness and speech difficultyMRI with scattered infarcts in left MCA/ affecting the frontal, parietal and occipital lobe Had mechanical thrombectomy EEG negative for seizures previously on lamicatal and depakote DM poorly controlled A1c 9.7 Had marijuana in drug screen ? Allergy to ASA and d/c with Brilinta D/c to rehab    He has a new aide with him today She is with him 9-4 7 days / week. He has two sisters near by No angina Some residual slow speech and right arm weakness from stroke Use to work for a [employer]    Past Medical History:  Diagnosis Date   Anxiety    Bipolar 1 disorder ([place])    Bone spur    left heel   Cervical radiculopathy    Chest pain    Chronic back pain    Chronic chest wall pain    Chronic neck pain    Coronary artery disease    a. s/p CABG in 03/2016 with LIMA-LAD, SVG-D1, SVG-RCA, and Seq SVG-mid and distal OM    Depression    Diabetes mellitus    Type II   Hallucinations    "long history of them"   Headache(784.0)    History of gout    HTN (hypertension)    Insomnia    Myocardial infarction ([place]) 2017   Neuropathy    Pain management    Polysubstance abuse ([place])    Right leg pain    chronic   Schizophrenia ([place])    Seizures ([place])    last sz between July 5-9th, 2016; epilepsy   Shortness of breath dyspnea    with exertion   Sleep apnea    Transfusion of blood product refused for religious reason     Past Surgical History:  Procedure Laterality Date   ANTERIOR CERVICAL DECOMP/DISCECTOMY FUSION N/A 12/14/2015   Procedure: ANTERIOR CERVICAL DECOMPRESSION/DISCECTOMY FUSION CERVICAL FIVE -SIX;  Surgeon: Earnie Larsson, MD;  [facility]: [facility];  Service: Neurosurgery;  Laterality: N/A;   BIOPSY  05/07/2015   Procedure: BIOPSY (Gastric);  Surgeon: [name], MD;  Location: [facility];  Service: Endoscopy;;   CARDIAC CATHETERIZATION N/A 03/07/2016   Procedure: Left Heart Cath and Coronary Angiography;  Surgeon: Belva Crome, MD;  Location: [place] [facility];  Service: Cardiovascular;  Laterality: N/A;   COLONOSCOPY WITH PROPOFOL N/A 05/07/2015   [id number]  diverticulosis, multiple colon polyps removed, tubular adenoma, serrated colon polyp. Next colonoscopy October 2019   COLONOSCOPY WITH PROPOFOL N/A 08/02/2018   Procedure: COLONOSCOPY WITH PROPOFOL;  Surgeon: [name], MD;  Location: [facility];  Service: Endoscopy;  Laterality: N/A;  12:00pm   CORONARY ARTERY BYPASS GRAFT N/A 03/28/2016   Procedure: CORONARY ARTERY BYPASS GRAFTING (CABG) x 5 USING GREATER SAPHENOUS VEIN;  Surgeon: [name], MD;  Location: [place] OR;  Service: Open Heart Surgery;  Laterality: N/A;   ENDOVEIN HARVEST OF GREATER SAPHENOUS VEIN Right 03/28/2016   Procedure: ENDOVEIN HARVEST OF GREATER SAPHENOUS VEIN;  Surgeon: [name], MD;  Location: [place];  Service: Open Heart Surgery;  Laterality: Right;    ESOPHAGEAL DILATION N/A 05/07/2015   Procedure: ESOPHAGEAL DILATION WITH 56FR MALONEY DILATOR;  Surgeon: [name], MD;  Location: [facility];  Service: Endoscopy;  Laterality: N/A;   ESOPHAGOGASTRODUODENOSCOPY (EGD) WITH PROPOFOL N/A 05/07/2015   RMR: Status post dilation of normal esophagus. Gastritis.   IR CT HEAD LTD  12/17/2020   IR CT HEAD LTD  12/17/2020   IR INTRA CRAN STENT  12/17/2020   IR PERCUTANEOUS ART THROMBECTOMY/INFUSION INTRACRANIAL INC DIAG ANGIO  12/17/2020   IR RADIOLOGIST EVAL & MGMT  03/25/2021   KNEE SURGERY Left    arthroscopy   MANDIBLE FRACTURE SURGERY     POLYPECTOMY  05/07/2015   Procedure: POLYPECTOMY (Hepatic Flexure, Distal Transverse Colon, Rectal);  Surgeon: [name], MD;  Location: [facility];  Service: Endoscopy;;   RADIOLOGY WITH ANESTHESIA N/A 12/16/2020   Procedure: IR WITH ANESTHESIA;  Surgeon: Luanne Bras, MD;  Location: [place];  Service: Radiology;  Laterality: N/A;   TEE WITHOUT CARDIOVERSION N/A 03/28/2016   Procedure: TRANSESOPHAGEAL ECHOCARDIOGRAM (TEE);  Surgeon: [name], MD;  Location: [place];  Service: Open Heart Surgery;  Laterality: N/A;   TONSILLECTOMY      Current Medications: Outpatient Medications Prior to Visit  Medication Sig Dispense Refill   albuterol (PROVENTIL HFA;VENTOLIN HFA) 108 (90 Base) MCG/ACT inhaler Inhale 2 puffs into the lungs every 4 (four) hours as needed for wheezing or shortness of breath.      amLODipine (NORVASC) 10 MG tablet Take 1 tablet (10 mg total) by mouth daily. 90 tablet 0   atorvastatin (LIPITOR) 80 MG tablet Take 1 tablet (80 mg total) by mouth at bedtime. 30 tablet 0   Blood Glucose Monitoring Suppl (ACCU-CHEK GUIDE ME) w/Device KIT 1 Piece by Does not apply route as directed. 1 kit 0   cycloSPORINE (RESTASIS) 0.05 % ophthalmic emulsion Place 1 drop into both eyes 2 (two) times daily.     divalproex (DEPAKOTE) 500 MG DR tablet Take 2 tablets (1,000 mg total) by mouth every 12 (twelve) hours.      escitalopram (LEXAPRO) 20 MG tablet Take 20 mg by mouth daily.      glimepiride (AMARYL) 2 MG tablet Take 1 tablet (2 mg total) by mouth daily with breakfast. 90 tablet 1   glucose blood (ACCU-CHEK GUIDE) test strip Monitor glucose 2 times a day. 150 each 2   hydrochlorothiazide (HYDRODIURIL) 25 MG tablet Take 1 tablet (25 mg total) by mouth daily. 90 tablet 3   isosorbide mononitrate (IMDUR) 30 MG 24 hr tablet Take 1 tablet (30 mg total) by mouth daily. 90 tablet 3   lamoTRIgine (LAMICTAL) 25 MG tablet Take 50 mg by mouth 2 (two) times daily.     LANTUS SOLOSTAR 100 UNIT/ML Solostar Pen INJECT  40 UNITS INTO THE SKIN AT BEDTIME. 15 mL 0   Melatonin 10 MG CAPS Take 10 capsules by mouth at bedtime as needed (for sleep).     Multiple Vitamin (MULTIVITAMIN WITH MINERALS) TABS tablet Take 1 tablet by mouth daily.     nitroGLYCERIN (NITROSTAT) 0.4 MG SL tablet Place 1 tablet (0.4 mg total) under the tongue every 5 (five) minutes as needed for chest pain. 25 tablet 3   pantoprazole (PROTONIX) 40 MG tablet Take 1 tablet (40 mg total) by mouth daily. (Patient taking differently: Take 40 mg by mouth daily as needed.)     senna-docusate (SENOKOT-S) 8.6-50 MG tablet Take 1 tablet by mouth at bedtime as needed for mild constipation or moderate constipation.     ticagrelor (BRILINTA) 90 MG TABS tablet Take 1 tablet (90 mg total) by mouth 2 (two) times daily. 60 tablet 5   No facility-administered medications prior to visit.     Allergies:   Aspirin   Social History   Socioeconomic History   Marital status: Single    Spouse name: Not on file   Number of children: Not on file   Years of education: 11th grade   Highest education level: Not on file  Occupational History   Occupation: unemployed    Employer: NOT EMPLOYED  Tobacco Use   Smoking status: Former    Packs/day: 0.50    Years: 30.00    Pack years: 15.00    Types: Cigarettes   Smokeless tobacco: Never   Tobacco comments:    3 cigs per day   Vaping Use   Vaping Use: Never used  Substance and Sexual Activity   Alcohol use: Not Currently   Drug use: Not Currently    Types: Marijuana   Sexual activity: Not Currently  Other Topics Concern   Not on file  Social History Narrative   Lives alone   Drinks a cup of coffee a day   Social Determinants of Health   Financial Resource Strain: Not on file  Food Insecurity: Not on file  Transportation Needs: Not on file  Physical Activity: Not on file  Stress: Not on file  Social Connections: Not on file     Family History:  The patient's family history includes Arthritis in an other family member; Asthma in an other family member; Diabetes in his father and another family member; Hypertension in his father; Stroke in his sister.   Review of Systems:   Please see the history of present illness.     General:  No chills, fever, night sweats or weight changes. Positive for left knee pain.  Cardiovascular:  No chest pain, dyspnea on exertion, edema, orthopnea, palpitations, paroxysmal nocturnal dyspnea. Dermatological: No rash, lesions/masses Respiratory: No cough, dyspnea Urologic: No hematuria, dysuria Abdominal:   No nausea, vomiting, diarrhea, bright red blood per rectum, melena, or hematemesis Neurologic:  No visual changes, wkns, changes in mental status. All other systems reviewed and are otherwise negative except as noted above.   Physical Exam:    VS:  BP 118/80   Pulse 75   Ht 5' 7"  (1.702 m)   Wt 65.8 kg   SpO2 99%   BMI 22.71 kg/m    Affect appropriate Healthy:  appears stated age HEENT: normal Neck supple with no adenopathy JVP normal no bruits no thyromegaly Lungs clear with no wheezing and good diaphragmatic motion Heart:  S1/S2 no murmur, no rub, gallop or click PMI normal post CABG  Abdomen: benighn, BS positve,  no tenderness, no AAA no bruit.  No HSM or HJR Distal pulses intact with no bruits No edema Neuro non-focal Skin warm and dry No  muscular weakness   Wt Readings from Last 3 Encounters:  04/13/21 65.8 kg  03/30/21 66.7 kg  03/09/21 65.8 kg     Studies/Labs Reviewed:   EKG:  EKG is not ordered today.    Recent Labs: 05/08/2020: TSH 0.55 12/18/2020: Magnesium 2.8 12/22/2020: ALT 19 02/09/2021: BUN 33; Creatinine, Ser 1.91; Hemoglobin 11.7; Platelets 200; Potassium 4.0; Sodium 136   Lipid Panel    Component Value Date/Time   CHOL 224 (H) 12/17/2020 0254   CHOL 286 (H) 02/12/2020 1334   TRIG 146 12/17/2020 0254   HDL 79 12/17/2020 0254   HDL 118 02/12/2020 1334   CHOLHDL 2.8 12/17/2020 0254   VLDL 29 12/17/2020 0254   LDLCALC 116 (H) 12/17/2020 0254   LDLCALC 145 (H) 02/12/2020 1334    Additional studies/ records that were reviewed today include:   Echocardiogram: 03/2019 IMPRESSIONS     1. The left ventricle has normal systolic function with an ejection  fraction of 60-65%. The cavity size was normal. Mild to moderate  concentric LVH. Left ventricular diastolic Doppler parameters are  indeterminate. No evidence of left ventricular  regional wall motion abnormalities.   2. Right atrial size was mildly dilated.   3. The mitral valve is grossly normal. Mild thickening of the mitral  valve leaflet.   4. The tricuspid valve is grossly normal.   5. The aortic valve is tricuspid.   6. The aorta is normal in size and structure.   NST: 09/2020 The study is normal. This is a low risk study. The left ventricular ejection fraction is normal (55-65%).   Normal resting and stress perfusion. No ischemia or infarction EF 56%     Assessment:    No diagnosis found.    Plan:   In order of problems listed above:  1. CAD - He is s/p CABG in 03/2016 with LIMA-LAD, SVG-D1, SVG-RCA, and Seq SVG-mid and distal OM. Myovue 12/21/20 showed normal resting and stress perfusion with no evidence of ischemia and was overall a low risk study. - He denies any recurrent chest pain Ranexa dose cut back . Will  continue Plavix 94m daily (allergic to ASA), Amlodipine 1110mdaily, Atorvastatin 4065maily and Imdur 22m22mily.   2. HTN - BP is well-controlled at 128/82 during today's visit. Continue Amlodipine, Lisinopril and Imdur at current dosing.   3. HLD - LDL was at 64 in 08/2020. Continue Atorvastatin 40mg66mly.   4. Palpitations - He reports occasional "skips" which sound most consistent with PAC's or PVC's. Cut back caffeine no need for monitor   5. Left Knee Pain - He reports symptoms consistent with OA. He is unsure of any prior imaging and has never had a steroid injection to the site. F/U ortho   6. Stroke:  post mechanical thrombectomy on Briilinta  f/u primary consider more PT/OT   7. DM:  poor control A1c 9.7 f/u primary importance of better control with recnet CVA and CAD discussed   8. Seizures:  EEG negative in hospital continue lamictal and depakote    F/U in a year    Signed, PeterJenkins Rouge 04/13/2021 12:55 PM    [facility] [id number]: [phone]

## 2021-04-13 ENCOUNTER — Encounter: Payer: Self-pay | Admitting: Cardiovascular Disease

## 2021-04-13 ENCOUNTER — Ambulatory Visit (INDEPENDENT_AMBULATORY_CARE_PROVIDER_SITE_OTHER): Payer: Medicaid Other | Admitting: Cardiovascular Disease

## 2021-04-13 ENCOUNTER — Other Ambulatory Visit: Payer: Self-pay

## 2021-04-13 VITALS — BP 118/80 | HR 75 | Ht 67.0 in | Wt 145.0 lb

## 2021-04-13 DIAGNOSIS — G40219 Localization-related (focal) (partial) symptomatic epilepsy and epileptic syndromes with complex partial seizures, intractable, without status epilepticus: Secondary | ICD-10-CM

## 2021-04-13 DIAGNOSIS — I10 Essential (primary) hypertension: Secondary | ICD-10-CM

## 2021-04-13 DIAGNOSIS — R002 Palpitations: Secondary | ICD-10-CM

## 2021-04-13 DIAGNOSIS — Z951 Presence of aortocoronary bypass graft: Secondary | ICD-10-CM | POA: Diagnosis not present

## 2021-04-13 DIAGNOSIS — E782 Mixed hyperlipidemia: Secondary | ICD-10-CM | POA: Diagnosis not present

## 2021-04-13 DIAGNOSIS — I63512 Cerebral infarction due to unspecified occlusion or stenosis of left middle cerebral artery: Secondary | ICD-10-CM

## 2021-04-13 NOTE — Patient Instructions (Signed)
Medication Instructions:  Your physician recommends that you continue on your current medications as directed. Please refer to the Current Medication list given to you today.  *If you need a refill on your cardiac medications before your next appointment, please call your pharmacy*   Lab Work: NONE   If you have labs (blood work) drawn today and your tests are completely normal, you will receive your results only by: . MyChart Message (if you have MyChart) OR . A paper copy in the mail If you have any lab test that is abnormal or we need to change your treatment, we will call you to review the results.   Testing/Procedures: NONE    Follow-Up: At [facility], you and your health needs are our priority.  As part of our continuing mission to provide you with exceptional heart care, we have created designated Provider Care Teams.  These Care Teams include your primary Cardiologist (physician) and Advanced Practice Providers (APPs -  Physician Assistants and Nurse Practitioners) who all work together to provide you with the care you need, when you need it.  We recommend signing up for the patient portal called "MyChart".  Sign up information is provided on this After Visit Summary.  MyChart is used to connect with patients for Virtual Visits (Telemedicine).  Patients are able to view lab/test results, encounter notes, upcoming appointments, etc.  Non-urgent messages can be sent to your provider as well.   To learn more about what you can do with MyChart, go to [email or web address].    Your next appointment:   1 year(s)  The format for your next appointment:   In Person  Provider:   Peter Nishan, MD   Other Instructions Thank you for choosing [facility]!    

## 2021-04-13 NOTE — Addendum Note (Signed)
Addended by: Levonne Hubert on: 04/13/2021 04:23 PM   Modules accepted: Orders

## 2021-04-20 ENCOUNTER — Telehealth: Payer: Self-pay | Admitting: Adult Health

## 2021-04-20 DIAGNOSIS — I63512 Cerebral infarction due to unspecified occlusion or stenosis of left middle cerebral artery: Secondary | ICD-10-CM

## 2021-04-20 DIAGNOSIS — R269 Unspecified abnormalities of gait and mobility: Secondary | ICD-10-CM

## 2021-04-20 DIAGNOSIS — I69319 Unspecified symptoms and signs involving cognitive functions following cerebral infarction: Secondary | ICD-10-CM

## 2021-04-20 DIAGNOSIS — I69398 Other sequelae of cerebral infarction: Secondary | ICD-10-CM

## 2021-04-20 NOTE — Telephone Encounter (Signed)
No home health agency will take medicaid for PT. Can you put a new order in for out patient PT.

## 2021-04-20 NOTE — Telephone Encounter (Signed)
FYI-[name] [name] on [place] called stating that they have been waiting for a call to schedule the pt's PT. She stated that no one has communicated with her and now she finds out that the pt's insurance is not accepted for home therapy and she has to wait more time to get the pt scheduled. [name] is not happy with how long this process is taking and no one has kept her up to date on any of it.

## 2021-04-20 NOTE — Telephone Encounter (Signed)
I am not sure if sister will be agreeable to do outpatient PT - can we find this out prior to placing referral? Thank you!

## 2021-04-21 ENCOUNTER — Ambulatory Visit (INDEPENDENT_AMBULATORY_CARE_PROVIDER_SITE_OTHER): Payer: Medicaid Other | Admitting: Podiatry

## 2021-04-21 ENCOUNTER — Encounter: Payer: Self-pay | Admitting: Podiatry

## 2021-04-21 ENCOUNTER — Other Ambulatory Visit: Payer: Self-pay

## 2021-04-21 DIAGNOSIS — M79675 Pain in left toe(s): Secondary | ICD-10-CM | POA: Diagnosis not present

## 2021-04-21 DIAGNOSIS — M79674 Pain in right toe(s): Secondary | ICD-10-CM | POA: Diagnosis not present

## 2021-04-21 DIAGNOSIS — E1142 Type 2 diabetes mellitus with diabetic polyneuropathy: Secondary | ICD-10-CM | POA: Diagnosis not present

## 2021-04-21 DIAGNOSIS — B351 Tinea unguium: Secondary | ICD-10-CM

## 2021-04-21 NOTE — Telephone Encounter (Signed)
I spoke with the sister and she is agreeable to out patent physical therapy since in home is not an option.  I have placed the order for PT and advise we would try to expedite this referral.   Sister voiced appreciation for the call and will wait to hear on scheduling PT.

## 2021-04-21 NOTE — Telephone Encounter (Signed)
Per Astrid Divine, Medicaid is not accepting PT order for in home therapy.  I spoke with the sister and she is agreeable to out patent physical therapy since in home is not an option.  I have placed the order for PT and advise we would try to expedite this referral.    Sister voiced appreciation for the call and will wait to hear on scheduling PT.

## 2021-04-21 NOTE — Progress Notes (Signed)
  Subjective:  Patient ID: Melvin Taylor, male    DOB: 1965-09-16,  MRN: [id number]  Chief Complaint  Patient presents with   Nail Problem    Nail trim    55 y.o. male returns for the above complaint.  Patient presents with thickened elongated dystrophic toenails x10.  Pain on palpation.  Patient is a diabetic with last A1c of 9.7.  He would like for me to have the nails debrided down.  He recently had a stroke and is currently on blood thinners.  Denies any other acute complaints.  Objective:  There were no vitals filed for this visit. Podiatric Exam: Vascular: dorsalis pedis and posterior tibial pulses are palpable bilateral. Capillary return is immediate. Temperature gradient is WNL. Skin turgor WNL  Sensorium: Normal Semmes Weinstein monofilament test. Normal tactile sensation bilaterally. Nail Exam: Pt has thick disfigured discolored nails with subungual debris noted bilateral entire nail hallux through fifth toenails.  Pain on palpation to the nails. Ulcer Exam: There is no evidence of ulcer or pre-ulcerative changes or infection. Orthopedic Exam: Muscle tone and strength are WNL. No limitations in general ROM. No crepitus or effusions noted. HAV  B/L.  Hammer toes 2-5  B/L. Skin: No Porokeratosis. No infection or ulcers    Assessment & Plan:   1. Pain due to onychomycosis of toenails of both feet   2. Diabetic peripheral neuropathy associated with type 2 diabetes mellitus ([place])      Patient was evaluated and treated and all questions answered.  Onychomycosis with pain  -Nails palliatively debrided as below. -Educated on self-care  Procedure: Nail Debridement Rationale: pain  Type of Debridement: manual, sharp debridement. Instrumentation: Nail nipper, rotary burr. Number of Nails: 10  Procedures and Treatment: Consent by patient was obtained for treatment procedures. The patient understood the discussion of treatment and procedures well. All questions were answered  thoroughly reviewed. Debridement of mycotic and hypertrophic toenails, 1 through 5 bilateral and clearing of subungual debris. No ulceration, no infection noted.  Return Visit-Office Procedure: Patient instructed to return to the office for a follow up visit 3 months for continued evaluation and treatment.  Boneta Lucks, DPM    Return in about 3 months (around 07/21/2021) for MAyer.

## 2021-04-21 NOTE — Addendum Note (Signed)
Addended by: Verlin Grills on: 04/21/2021 08:36 AM   Modules accepted: Orders

## 2021-04-30 ENCOUNTER — Ambulatory Visit: Payer: Medicaid Other | Admitting: Podiatry

## 2021-05-04 ENCOUNTER — Telehealth: Payer: Self-pay | Admitting: Adult Health

## 2021-05-04 NOTE — Telephone Encounter (Signed)
Please advise 

## 2021-05-04 NOTE — Telephone Encounter (Signed)
Please place order for OT/SLP.  Thank you.

## 2021-05-04 NOTE — Addendum Note (Signed)
Addended by: Minna Antis on: 05/04/2021 04:03 PM   Modules accepted: Orders

## 2021-05-04 NOTE — Telephone Encounter (Signed)
Orders sent via Epic to [facility] outpatient rehab.

## 2021-05-04 NOTE — Telephone Encounter (Signed)
Patient sister called and left a voicemail on my phone stating they need to schedule a CTA Head. I don't see anything that is order yet.

## 2021-05-04 NOTE — Telephone Encounter (Signed)
Sister needs to contact interventional radiology Dr. Estanislado Pandy who is managing this.  Thank you.

## 2021-05-04 NOTE — Telephone Encounter (Signed)
Orders placed.

## 2021-05-04 NOTE — Telephone Encounter (Signed)
Received phone call from Ollen Gross at [facility] outpatient Rehab. Stated they received referral for patient and scheduled him fot PT, but reading notes from [place] stated patient needs PT/OT/SLP-this was originally sent to [facility] but denied. Stated individual referrals would have to be entered for OT and SLP in order to get patient scheduled for this.

## 2021-05-04 NOTE — Telephone Encounter (Signed)
I dont see anything regarding this, is pt suppose to have CT ? Please advise

## 2021-05-04 NOTE — Telephone Encounter (Signed)
Contacted sister back, informed her that she need to contact interventional radiology Dr. Estanislado Pandy who is managing this. She stated she left him a message also already.

## 2021-05-06 ENCOUNTER — Ambulatory Visit: Payer: Medicaid Other | Admitting: Student

## 2021-05-12 ENCOUNTER — Ambulatory Visit (HOSPITAL_COMMUNITY): Payer: Medicaid Other | Admitting: Physical Therapy

## 2021-05-13 ENCOUNTER — Ambulatory Visit (HOSPITAL_COMMUNITY): Payer: Medicaid Other | Attending: Adult Health

## 2021-05-13 ENCOUNTER — Encounter (HOSPITAL_COMMUNITY): Payer: Medicaid Other | Admitting: Speech Pathology

## 2021-05-13 DIAGNOSIS — I63512 Cerebral infarction due to unspecified occlusion or stenosis of left middle cerebral artery: Secondary | ICD-10-CM | POA: Insufficient documentation

## 2021-05-13 DIAGNOSIS — R269 Unspecified abnormalities of gait and mobility: Secondary | ICD-10-CM | POA: Insufficient documentation

## 2021-05-13 DIAGNOSIS — R2681 Unsteadiness on feet: Secondary | ICD-10-CM | POA: Insufficient documentation

## 2021-05-13 DIAGNOSIS — R29898 Other symptoms and signs involving the musculoskeletal system: Secondary | ICD-10-CM | POA: Insufficient documentation

## 2021-05-13 DIAGNOSIS — R278 Other lack of coordination: Secondary | ICD-10-CM | POA: Insufficient documentation

## 2021-05-14 ENCOUNTER — Other Ambulatory Visit: Payer: Self-pay

## 2021-05-14 ENCOUNTER — Ambulatory Visit (HOSPITAL_COMMUNITY): Payer: Medicaid Other

## 2021-05-14 DIAGNOSIS — I63512 Cerebral infarction due to unspecified occlusion or stenosis of left middle cerebral artery: Secondary | ICD-10-CM | POA: Diagnosis present

## 2021-05-14 DIAGNOSIS — R2681 Unsteadiness on feet: Secondary | ICD-10-CM | POA: Diagnosis not present

## 2021-05-14 DIAGNOSIS — R269 Unspecified abnormalities of gait and mobility: Secondary | ICD-10-CM | POA: Diagnosis present

## 2021-05-14 DIAGNOSIS — R29898 Other symptoms and signs involving the musculoskeletal system: Secondary | ICD-10-CM | POA: Diagnosis present

## 2021-05-14 DIAGNOSIS — R278 Other lack of coordination: Secondary | ICD-10-CM | POA: Diagnosis present

## 2021-05-14 NOTE — Therapy (Signed)
[facility] [address] [place], [place], [address] Phone: [phone]   Fax:  [phone]  Physical Therapy Evaluation  Patient Details  Name: Melvin Taylor MRN: [id number] Date of Birth: 06-28-1966 Referring Provider (PT): [name], NP   Encounter Date: 05/14/2021   PT End of Session - 05/14/21 1348     Visit Number 1    Number of Visits 4    Date for PT Re-Evaluation 06/11/21    Authorization Type Medicaid [place]    PT Start Time 1856    PT Stop Time 1430    PT Time Calculation (min) 45 min    Activity Tolerance Patient tolerated treatment well    Behavior During Therapy [facility] for tasks assessed/performed             Past Medical History:  Diagnosis Date   Anxiety    Bipolar 1 disorder ([place])    Bone spur    left heel   Cervical radiculopathy    Chest pain    Chronic back pain    Chronic chest wall pain    Chronic neck pain    Coronary artery disease    a. s/p CABG in 03/2016 with LIMA-LAD, SVG-D1, SVG-RCA, and Seq SVG-mid and distal OM   Depression    Diabetes mellitus    Type II   Hallucinations    "long history of them"   Headache(784.0)    History of gout    HTN (hypertension)    Insomnia    Myocardial infarction ([place]) 2017   Neuropathy    Pain management    Polysubstance abuse ([place])    Right leg pain    chronic   Schizophrenia ([place])    Seizures ([place])    last sz between July 5-9th, 2016; epilepsy   Shortness of breath dyspnea    with exertion   Sleep apnea    Transfusion of blood product refused for religious reason     Past Surgical History:  Procedure Laterality Date   ANTERIOR CERVICAL DECOMP/DISCECTOMY FUSION N/A 12/14/2015   Procedure: ANTERIOR CERVICAL DECOMPRESSION/DISCECTOMY FUSION CERVICAL FIVE -SIX;  Surgeon: Earnie Larsson, MD;  [facility];  Service: Neurosurgery;  Laterality: N/A;   BIOPSY  05/07/2015   Procedure: BIOPSY (Gastric);  Surgeon: [name], MD;   [facility]: [facility];  Service: Endoscopy;;   CARDIAC CATHETERIZATION N/A 03/07/2016   Procedure: Left Heart Cath and Coronary Angiography;  Surgeon: Belva Crome, MD;  Location: [place] [facility];  Service: Cardiovascular;  Laterality: N/A;   COLONOSCOPY WITH PROPOFOL N/A 05/07/2015   DJS:[id number] diverticulosis, multiple colon polyps removed, tubular adenoma, serrated colon polyp. Next colonoscopy October 2019   COLONOSCOPY WITH PROPOFOL N/A 08/02/2018   Procedure: COLONOSCOPY WITH PROPOFOL;  Surgeon: [name], MD;  [facility];  Service: Endoscopy;  Laterality: N/A;  12:00pm   CORONARY ARTERY BYPASS GRAFT N/A 03/28/2016   Procedure: CORONARY ARTERY BYPASS GRAFTING (CABG) x 5 USING GREATER SAPHENOUS VEIN;  Surgeon: [name], MD;  Location: [facility];  Service: Open Heart Surgery;  Laterality: N/A;   ENDOVEIN HARVEST OF GREATER SAPHENOUS VEIN Right 03/28/2016   Procedure: ENDOVEIN HARVEST OF GREATER SAPHENOUS VEIN;  Surgeon: [name], MD;  Location: [facility];  Service: Open Heart Surgery;  Laterality: Right;   ESOPHAGEAL DILATION N/A 05/07/2015   Procedure: ESOPHAGEAL DILATION WITH 56FR MALONEY DILATOR;  Surgeon: [name], MD;  Location: [facility];  Service: Endoscopy;  Laterality: N/A;   ESOPHAGOGASTRODUODENOSCOPY (EGD) WITH PROPOFOL N/A 05/07/2015   RMR: Status post dilation of normal esophagus. Gastritis.   IR CT HEAD LTD  12/17/2020   IR CT HEAD LTD  12/17/2020   IR INTRA CRAN STENT  12/17/2020   IR PERCUTANEOUS ART THROMBECTOMY/INFUSION INTRACRANIAL INC DIAG ANGIO  12/17/2020   IR RADIOLOGIST EVAL & MGMT  03/25/2021   KNEE SURGERY Left    arthroscopy   MANDIBLE FRACTURE SURGERY     POLYPECTOMY  05/07/2015   Procedure: POLYPECTOMY (Hepatic Flexure, Distal Transverse Colon, Rectal);  Surgeon: [name], MD;  [facility];  Service: Endoscopy;;   RADIOLOGY WITH ANESTHESIA N/A 12/16/2020   Procedure: IR WITH ANESTHESIA;  Surgeon: Luanne Bras, MD;  Location: [facility];  Service: Radiology;  Laterality: N/A;   TEE WITHOUT CARDIOVERSION N/A 03/28/2016   Procedure: TRANSESOPHAGEAL ECHOCARDIOGRAM (TEE);  Surgeon: [name], MD;  Location: [facility];  Service: Open Heart Surgery;  Laterality: N/A;   TONSILLECTOMY      There were no vitals filed for this visit.    Subjective Assessment - 05/14/21 1350     Subjective Pt had CVA 3-4 months ago involvoing left MCA.  Pt reports chief complaint of memory loss and endorses hx of trips and falls    Patient is accompained by: --   PCA   Limitations Walking    Currently in Pain? No/denies                [facility] PT Assessment - 05/14/21 0001       Assessment   Medical Diagnosis I69.398,R26.9 (ICD-10-CM) - Gait disturbance, post-stroke    Referring Provider (PT) [name], NP    Onset Date/Surgical Date 12/16/20    Hand Dominance Right    Prior Therapy brief inpatient rehab      Balance Screen   Has the patient fallen in the past 6 months Yes    How many times? about 5 times    Has the patient had a decrease in activity level because of a fear of falling?  No    Is the patient reluctant to leave their home because of a fear of falling?  No      Home Environment   Living Environment Private residence    Living Arrangements Alone    Available Help at Discharge Personal care attendant    Type of [place] One level    [place] - single point      Prior Function   Level of Independence Independent with household mobility with device;Needs assistance with ADLs;Needs assistance with homemaking    Vocation On disability      Cognition   Memory Impaired    Awareness Appears intact      Sensation   Proprioception Appears Intact      Coordination   Gross Motor Movements are Fluid and Coordinated Yes    Fine Motor Movements are Fluid and Coordinated No      Functional Tests   Functional tests Single leg stance      Single Leg Stance    Comments 5-10 sec      Posture/Postural Control   Posture/Postural Control Postural limitations    Postural Limitations Forward head   head down posture from chronic neck pain     ROM / Strength   AROM / PROM / Strength Strength      Strength  Overall Strength Comments 5/5 gross strength BLE    Strength Assessment Site Hip;Knee;Ankle      Transfers   Transfers Sit to Stand;Stand Pivot Transfers    Sit to Stand 7: Independent    Stand Pivot Transfers 7: Independent      Ambulation/Gait   Ambulation/Gait Yes    Ambulation/Gait Assistance 7: Independent    Ambulation Distance (Feet) 250 Feet    Assistive device None    Gait Pattern Step-through pattern;Wide base of support   ambulates with bilateral LE external rotation, this seems to just be his habitus   Ambulation Surface Level;Indoor    Stairs Yes    Stairs Assistance 4: Min guard   retro-LOB when descending stairs   Stair Management Technique Alternating pattern    Number of Stairs 8      Standardized Balance Assessment   Standardized Balance Assessment Berg Balance Test;Dynamic Gait Index      Berg Balance Test   Sit to Stand Able to stand without using hands and stabilize independently    Standing Unsupported Able to stand safely 2 minutes    Sitting with Back Unsupported but Feet Supported on Floor or Stool Able to sit safely and securely 2 minutes    Stand to Sit Sits safely with minimal use of hands    Transfers Able to transfer safely, minor use of hands    Standing Unsupported with Eyes Closed Able to stand 10 seconds safely    Standing Unsupported with Feet Together Able to place feet together independently and stand 1 minute safely    From Standing, Reach Forward with Outstretched Arm Can reach confidently >25 cm (10")    From Standing Position, Pick up Object from Floor Able to pick up shoe, needs supervision    From Standing Position, Turn to Look Behind Over each Shoulder Looks behind from both sides and  weight shifts well    Turn 360 Degrees Able to turn 360 degrees safely in 4 seconds or less    Standing Unsupported, Alternately Place Feet on Step/Stool Able to stand independently and complete 8 steps >20 seconds    Standing Unsupported, One Foot in Front Able to plae foot ahead of the other independently and hold 30 seconds    Standing on One Leg Able to lift leg independently and hold 5-10 seconds    Total Score 52      Dynamic Gait Index   Level Surface Normal    Change in Gait Speed Mild Impairment    Gait with Horizontal Head Turns Mild Impairment    Gait with Vertical Head Turns Mild Impairment    Gait and Pivot Turn Mild Impairment    Step Over Obstacle Mild Impairment    Step Around Obstacles Mild Impairment    Steps Moderate Impairment    Total Score 16      Functional Gait  Assessment   Gait assessed  --                        Objective measurements completed on examination: See above findings.                PT Education - 05/14/21 1438     Education Details pt and CPA educated on gait/balance assessments    Person(s) Educated Patient;Caregiver(s)    Methods Explanation    Comprehension Verbalized understanding              PT Short Term Goals -  05/14/21 1443       PT SHORT TERM GOAL #1   Title Patient will be independent with HEP in order to improve functional outcomes.    Time 2    Period Weeks    Status New    Target Date 05/28/21      PT SHORT TERM GOAL #2   Title Improve LE strength/coordination/balance as evident by 10 sec SLS time    Baseline 5-10 sec    Time 2    Period Weeks    Status New    Target Date 05/28/21      PT SHORT TERM GOAL #3   Title Improve safety with ambulation as evidenced by score of 19/22 Dynamic Gait Index    Baseline 16/22    Time 2    Period Weeks    Status New    Target Date 05/28/21               PT Long Term Goals - 05/14/21 1445       PT LONG TERM GOAL #1   Title  Decrease risk for falls per score 56/56 Berg Balance Test    Baseline 52/56    Time 4    Period Weeks    Status New    Target Date 06/11/21                    Plan - 05/14/21 1439     Clinical Impression Statement Pt is 55 yo male s/p hx of stroke left MCA with gait disturbance and resultant balance deficits and difficulty in walking with risk for falls per Dynamic Gait Index and instances of LOB when negotiating stairs.  Pt would benefit from PT services to improve dynamic balance, enhance postural awareness, improve motor control/coordination in order to reduce risk for falls and improve independence and safety with ambulation and community mobility    Personal Factors and Comorbidities Comorbidity 3+    Comorbidities hx of cardiac issues, psychiatric conditions, cervical fusion    Examination-Activity Limitations Bend;Stairs;Locomotion Level    Examination-Participation Restrictions Cleaning;Shop    Stability/Clinical Decision Making Evolving/Moderate complexity    Clinical Decision Making Moderate    Rehab Potential Good    PT Frequency 1x / week    PT Duration 4 weeks    PT Treatment/Interventions ADLs/Self Care Home Management;Gait training;Stair training;Therapeutic activities;Therapeutic exercise;Balance training;Neuromuscular re-education;Manual techniques    PT Next Visit Plan HEP for balance    Consulted and Agree with Plan of Care Patient             Patient will benefit from skilled therapeutic intervention in order to improve the following deficits and impairments:  Abnormal gait, Decreased balance, Postural dysfunction, Impaired perceived functional ability  Visit Diagnosis: Unsteadiness on feet  Abnormal gait     Problem List Patient Active Problem List   Diagnosis Date Noted   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   Malnutrition of moderate degree 12/18/2020   Middle cerebral artery embolism, 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   Encounter for  intubation    Renal insufficiency    Malignant hypertension    Acute ischemic left MCA stroke ([place]) 12/16/2020   Current smoker 06/21/2019   Seizure-like activity ([place]) 05/06/2019   Diabetes mellitus ([place]) 04/11/2019   Anemia, chronic disease 07/04/2018   Other specified diseases of the digestive system 10/30/2017   CAD (coronary artery disease) 03/28/2016   Chest pain 03/21/2016   Mixed hyperlipidemia 03/11/2016   Ischemic chest pain ([place])  NSTEMI (non-ST elevated myocardial infarction) ([place])    Coronary atherosclerosis of native coronary artery 03/03/2016   Cervical spinal stenosis 12/14/2015   Loss of weight 08/25/2015   Essential hypertension, benign 07/23/2015   Personal history of noncompliance with medical treatment, presenting hazards to health 07/23/2015   Abdominal pain 06/08/2015   Constipation 06/08/2015   History of colonic polyps    Diverticulosis of colon without hemorrhage    Mucosal abnormality of stomach    Encounter for screening colonoscopy 04/16/2015   Dysphagia 04/16/2015   Partial symptomatic epilepsy with complex partial seizures, intractable, without status epilepticus ([place]) 11/10/2014   Bipolar disorder, unspecified ([place]) 10/15/2014   Schizophrenia ([place]) 10/15/2014   DM type 2 causing vascular disease ([place]) 06/29/2014   Musculoskeletal pain 06/29/2014   Hyperglycemia without ketosis 09/07/2013   Degeneration disease of medial meniscus 01/13/2011   Other internal derangements of unspecified knee 01/13/2011   Knee pain 12/28/2010   Medial meniscus, posterior horn derangement 12/28/2010    Toniann Fail, PT 05/14/2021, 2:47 PM  [facility] [address] [place], [place], [address] Phone: [phone]   Fax:  [phone]  Name: Melvin Taylor MRN: [id number] Date of Birth: 1966/03/20

## 2021-05-17 ENCOUNTER — Ambulatory Visit (HOSPITAL_COMMUNITY): Payer: Medicaid Other | Admitting: Physical Therapy

## 2021-05-20 ENCOUNTER — Other Ambulatory Visit: Payer: Self-pay

## 2021-05-20 ENCOUNTER — Ambulatory Visit (HOSPITAL_COMMUNITY): Payer: Medicaid Other | Admitting: Physical Therapy

## 2021-05-20 DIAGNOSIS — R2681 Unsteadiness on feet: Secondary | ICD-10-CM | POA: Diagnosis not present

## 2021-05-20 DIAGNOSIS — R269 Unspecified abnormalities of gait and mobility: Secondary | ICD-10-CM

## 2021-05-20 NOTE — Patient Instructions (Signed)
Access Code: [email or web address] URL: [email or web address] Date: 05/20/2021 Prepared by: Josue Hector  Exercises Heel Raises with Counter Support - 2-3 x daily - 7 x weekly - 2 sets - 10 reps Standing Tandem Balance with Counter Support - 2-3 x daily - 7 x weekly - 1 sets - 3 reps - 20 second hold Standing Hip Abduction with Counter Support - 2-3 x daily - 7 x weekly - 2 sets - 10 reps Sit to Stand with Armchair - 2-3 x daily - 7 x weekly - 2 sets - 10 reps

## 2021-05-20 NOTE — Therapy (Signed)
[facility] [place], [place], [address] Phone: [phone]   Fax:  [phone]  Physical Therapy Treatment  Patient Details  Name: Melvin Taylor MRN: [id number] Date of Birth: May 06, 1966 Referring Provider (PT): [name], NP   Encounter Date: 05/20/2021   PT End of Session - 05/20/21 1327     Visit Number 2    Number of Visits 4    Date for PT Re-Evaluation 06/11/21    Authorization Type Medicaid [place] Time Period 3 visits approved 10/15-11/4/22    PT Start Time 1320   Arrived late   PT Stop Time 1343    PT Time Calculation (min) 23 min    Activity Tolerance Patient tolerated treatment well    Behavior During Therapy [facility] for tasks assessed/performed             Past Medical History:  Diagnosis Date   Anxiety    Bipolar 1 disorder ([place])    Bone spur    left heel   Cervical radiculopathy    Chest pain    Chronic back pain    Chronic chest wall pain    Chronic neck pain    Coronary artery disease    a. s/p CABG in 03/2016 with LIMA-LAD, SVG-D1, SVG-RCA, and Seq SVG-mid and distal OM   Depression    Diabetes mellitus    Type II   Hallucinations    "long history of them"   Headache(784.0)    History of gout    HTN (hypertension)    Insomnia    Myocardial infarction ([place]) 2017   Neuropathy    Pain management    Polysubstance abuse ([place])    Right leg pain    chronic   Schizophrenia ([place])    Seizures ([place])    last sz between July 5-9th, 2016; epilepsy   Shortness of breath dyspnea    with exertion   Sleep apnea    Transfusion of blood product refused for religious reason     Past Surgical History:  Procedure Laterality Date   ANTERIOR CERVICAL DECOMP/DISCECTOMY FUSION N/A 12/14/2015   Procedure: ANTERIOR CERVICAL DECOMPRESSION/DISCECTOMY FUSION CERVICAL FIVE -SIX;  Surgeon: Earnie Larsson, MD;  [facility];  Service: Neurosurgery;  Laterality: N/A;   BIOPSY   05/07/2015   Procedure: BIOPSY (Gastric);  Surgeon: [name], MD;  Location: [facility];  Service: Endoscopy;;   CARDIAC CATHETERIZATION N/A 03/07/2016   Procedure: Left Heart Cath and Coronary Angiography;  Surgeon: Belva Crome, MD;  Location: [place] CV LAB;  Service: Cardiovascular;  Laterality: N/A;   COLONOSCOPY WITH PROPOFOL N/A 05/07/2015   OVF:[id number] diverticulosis, multiple colon polyps removed, tubular adenoma, serrated colon polyp. Next colonoscopy October 2019   COLONOSCOPY WITH PROPOFOL N/A 08/02/2018   Procedure: COLONOSCOPY WITH PROPOFOL;  Surgeon: [name], MD;  [facility];  Service: Endoscopy;  Laterality: N/A;  12:00pm   CORONARY ARTERY BYPASS GRAFT N/A 03/28/2016   Procedure: CORONARY ARTERY BYPASS GRAFTING (CABG) x 5 USING GREATER SAPHENOUS VEIN;  Surgeon: [name], MD;  Location: [facility] OR;  Service: Open Heart Surgery;  Laterality: N/A;   ENDOVEIN HARVEST OF GREATER SAPHENOUS VEIN Right 03/28/2016   Procedure: ENDOVEIN HARVEST OF GREATER SAPHENOUS VEIN;  Surgeon: [name], MD;  Location: [facility];  Service: Open Heart Surgery;  Laterality: Right;   ESOPHAGEAL DILATION N/A 05/07/2015   Procedure: ESOPHAGEAL DILATION WITH 56FR MALONEY DILATOR;  Surgeon: [name], MD;  [facility];  Service: Endoscopy;  Laterality: N/A;   ESOPHAGOGASTRODUODENOSCOPY (EGD) WITH PROPOFOL N/A 05/07/2015   RMR: Status post dilation of normal esophagus. Gastritis.   IR CT HEAD LTD  12/17/2020   IR CT HEAD LTD  12/17/2020   IR INTRA CRAN STENT  12/17/2020   IR PERCUTANEOUS ART THROMBECTOMY/INFUSION INTRACRANIAL INC DIAG ANGIO  12/17/2020   IR RADIOLOGIST EVAL & MGMT  03/25/2021   KNEE SURGERY Left    arthroscopy   MANDIBLE FRACTURE SURGERY     POLYPECTOMY  05/07/2015   Procedure: POLYPECTOMY (Hepatic Flexure, Distal Transverse Colon, Rectal);  Surgeon: [name], MD;  [facility];  Service: Endoscopy;;   RADIOLOGY WITH ANESTHESIA N/A 12/16/2020    Procedure: IR WITH ANESTHESIA;  Surgeon: Luanne Bras, MD;  Location: [facility];  Service: Radiology;  Laterality: N/A;   TEE WITHOUT CARDIOVERSION N/A 03/28/2016   Procedure: TRANSESOPHAGEAL ECHOCARDIOGRAM (TEE);  Surgeon: [name], MD;  Location: [facility];  Service: Open Heart Surgery;  Laterality: N/A;   TONSILLECTOMY      There were no vitals filed for this visit.   Subjective Assessment - 05/20/21 1326     Subjective Patient says he is "wore out". No new issues. No falls.    Patient is accompained by: --    Limitations Walking    Currently in Pain? Yes    Pain Score 8    global through upper body/ extremities                              [facility] Adult PT Treatment/Exercise - 05/20/21 0001       Exercises   Exercises Knee/Hip      Knee/Hip Exercises: Standing   Heel Raises Both;2 sets;10 reps    Hip Abduction Both;2 sets;10 reps    Other Standing Knee Exercises tandem stance 2 x 30" each solid floor      Knee/Hip Exercises: Seated   Sit to Sand 1 set;5 reps;with UE support                       PT Short Term Goals - 05/14/21 1443       PT SHORT TERM GOAL #1   Title Patient will be independent with HEP in order to improve functional outcomes.    Time 2    Period Weeks    Status New    Target Date 05/28/21      PT SHORT TERM GOAL #2   Title Improve LE strength/coordination/balance as evident by 10 sec SLS time    Baseline 5-10 sec    Time 2    Period Weeks    Status New    Target Date 05/28/21      PT SHORT TERM GOAL #3   Title Improve safety with ambulation as evidenced by score of 19/22 Dynamic Gait Index    Baseline 16/22    Time 2    Period Weeks    Status New    Target Date 05/28/21               PT Long Term Goals - 05/14/21 1445       PT LONG TERM GOAL #1   Title Decrease risk for falls per score 56/56 Berg Balance Test    Baseline 52/56    Time 4    Period Weeks    Status  New    Target Date  06/11/21                   Plan - 05/20/21 1340     Clinical Impression Statement Session limited by late arrival. Reviewed goals and introduced HEP for LE strength and static balance. Patient educated on purpose and function of all added activity. Patient with somewhat lethargic disposition today but able to perform all activity successfully and without increased reports of pain. Issued updated HEP handout. Patient will continue to benefit from skilled therapy services to progress Le strength and balance for improved functional mobility and reduced risk for falls.    Personal Factors and Comorbidities Comorbidity 3+    Comorbidities hx of cardiac issues, psychiatric conditions, cervical fusion    Examination-Activity Limitations [place];Stairs;Locomotion Level    Examination-Participation Restrictions Cleaning;Shop    Stability/Clinical Decision Making Evolving/Moderate complexity    Rehab Potential Good    PT Frequency 1x / week    PT Duration 4 weeks    PT Treatment/Interventions ADLs/Self Care Home Management;Gait training;Stair training;Therapeutic activities;Therapeutic exercise;Balance training;Neuromuscular re-education;Manual techniques    PT Next Visit Plan Continue with LE strength and balance progression as tolerated. Issue weekly HEP.    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) 05/06/2019   Diabetes mellitus ([place]) 04/11/2019   Anemia, chronic disease 07/04/2018   Other specified diseases of the digestive system 10/30/2017   CAD (coronary artery disease) 03/28/2016   Chest pain 03/21/2016   Mixed hyperlipidemia 03/11/2016   Ischemic chest pain ([facility])    NSTEMI (non-ST elevated myocardial infarction) ([place])    Coronary atherosclerosis of native coronary artery 03/03/2016   Cervical spinal stenosis 12/14/2015   Loss of weight 08/25/2015   Essential hypertension, benign 07/23/2015   Personal history of noncompliance with medical treatment, presenting hazards to health 07/23/2015   Abdominal pain 06/08/2015   Constipation 06/08/2015   History of colonic polyps    Diverticulosis of colon without hemorrhage    Mucosal abnormality of stomach    Encounter for screening colonoscopy 04/16/2015   Dysphagia 04/16/2015   Partial symptomatic epilepsy with complex partial seizures, intractable, without status epilepticus ([place]) 11/10/2014   Bipolar disorder, unspecified ([place]) 10/15/2014   Schizophrenia ([place]) 10/15/2014   DM type 2 causing vascular disease ([place]) 06/29/2014   Musculoskeletal pain 06/29/2014   Hyperglycemia without ketosis 09/07/2013   Degeneration disease of medial meniscus 01/13/2011   Other internal derangements of unspecified knee 01/13/2011   Knee pain 12/28/2010   Medial meniscus, posterior horn derangement 12/28/2010   1:46 PM, 05/20/21 Josue Hector PT DPT  Physical Therapist with [facility]  [phone])  [facility] [address] [place], [place], [address] Phone: [phone]   Fax:  [phone]  Name: TALVIN CHRISTIANSON MRN: [id number] Date of Birth:  01-May-1966

## 2021-05-25 ENCOUNTER — Other Ambulatory Visit: Payer: Self-pay

## 2021-05-25 ENCOUNTER — Ambulatory Visit (HOSPITAL_COMMUNITY): Payer: Medicaid Other | Admitting: Physical Therapy

## 2021-05-25 DIAGNOSIS — R269 Unspecified abnormalities of gait and mobility: Secondary | ICD-10-CM

## 2021-05-25 DIAGNOSIS — R2681 Unsteadiness on feet: Secondary | ICD-10-CM

## 2021-05-25 DIAGNOSIS — I63512 Cerebral infarction due to unspecified occlusion or stenosis of left middle cerebral artery: Secondary | ICD-10-CM

## 2021-05-25 NOTE — Therapy (Signed)
[place] [address] [place], [place], [address] Phone: [phone]   Fax:  [phone]  Physical Therapy Treatment  Patient Details  Name: Melvin [place] MRN: [id number] Date of Birth: Oct 08, 1965 Referring Provider (PT): [name], NP   Encounter Date: 05/25/2021   PT End of Session - 05/25/21 1352     Visit Number 3    Number of Visits 4    Date for PT Re-Evaluation 06/11/21    Authorization Type Medicaid [place] Time Period 3 visits approved 10/15-11/4/22    PT Start Time 2633    PT Stop Time 3545    PT Time Calculation (min) 35 min    Activity Tolerance Patient tolerated treatment well    Behavior During Therapy [facility] for tasks assessed/performed             Past Medical History:  Diagnosis Date   Anxiety    Bipolar 1 disorder ([place])    Bone spur    left heel   Cervical radiculopathy    Chest pain    Chronic back pain    Chronic chest wall pain    Chronic neck pain    Coronary artery disease    a. s/p CABG in 03/2016 with LIMA-LAD, SVG-D1, SVG-RCA, and Seq SVG-mid and distal OM   Depression    Diabetes mellitus    Type II   Hallucinations    "long history of them"   Headache(784.0)    History of gout    HTN (hypertension)    Insomnia    Myocardial infarction ([place]) 2017   Neuropathy    Pain management    Polysubstance abuse ([place])    Right leg pain    chronic   Schizophrenia ([place])    Seizures ([place])    last sz between July 5-9th, 2016; epilepsy   Shortness of breath dyspnea    with exertion   Sleep apnea    Transfusion of blood product refused for religious reason     Past Surgical History:  Procedure Laterality Date   ANTERIOR CERVICAL DECOMP/DISCECTOMY FUSION N/A 12/14/2015   Procedure: ANTERIOR CERVICAL DECOMPRESSION/DISCECTOMY FUSION CERVICAL FIVE -SIX;  Surgeon: Earnie Larsson, MD;  [facility];  Service: Neurosurgery;  Laterality: N/A;   BIOPSY  05/07/2015    Procedure: BIOPSY (Gastric);  Surgeon: [name], MD;  [facility];  Service: Endoscopy;;   CARDIAC CATHETERIZATION N/A 03/07/2016   Procedure: Left Heart Cath and Coronary Angiography;  Surgeon: Belva Crome, MD;  Location: [place] [facility];  Service: Cardiovascular;  Laterality: N/A;   COLONOSCOPY WITH PROPOFOL N/A 05/07/2015   [id number] diverticulosis, multiple colon polyps removed, tubular adenoma, serrated colon polyp. Next colonoscopy October 2019   COLONOSCOPY WITH PROPOFOL N/A 08/02/2018   Procedure: COLONOSCOPY WITH PROPOFOL;  Surgeon: [name], MD;  [facility];  Service: Endoscopy;  Laterality: N/A;  12:00pm   CORONARY ARTERY BYPASS GRAFT N/A 03/28/2016   Procedure: CORONARY ARTERY BYPASS GRAFTING (CABG) x 5 USING GREATER SAPHENOUS VEIN;  Surgeon: [name], MD;  Location: [place] OR;  Service: Open Heart Surgery;  Laterality: N/A;   ENDOVEIN HARVEST OF GREATER SAPHENOUS VEIN Right 03/28/2016   Procedure: ENDOVEIN HARVEST OF GREATER SAPHENOUS VEIN;  Surgeon: [name], MD;  Location: [place];  Service: Open Heart Surgery;  Laterality: Right;   ESOPHAGEAL DILATION N/A 05/07/2015   Procedure: ESOPHAGEAL DILATION WITH 56FR MALONEY DILATOR;  Surgeon: Herbie Baltimore  Hilton Cork, MD;  [facility];  Service: Endoscopy;  Laterality: N/A;   ESOPHAGOGASTRODUODENOSCOPY (EGD) WITH PROPOFOL N/A 05/07/2015   RMR: Status post dilation of normal esophagus. Gastritis.   IR CT HEAD LTD  12/17/2020   IR CT HEAD LTD  12/17/2020   IR INTRA CRAN STENT  12/17/2020   IR PERCUTANEOUS ART THROMBECTOMY/INFUSION INTRACRANIAL INC DIAG ANGIO  12/17/2020   IR RADIOLOGIST EVAL & MGMT  03/25/2021   KNEE SURGERY Left    arthroscopy   MANDIBLE FRACTURE SURGERY     POLYPECTOMY  05/07/2015   Procedure: POLYPECTOMY (Hepatic Flexure, Distal Transverse Colon, Rectal);  Surgeon: [name], MD;  [facility];  Service: Endoscopy;;   RADIOLOGY WITH ANESTHESIA N/A 12/16/2020   Procedure: IR WITH  ANESTHESIA;  Surgeon: Luanne Bras, MD;  Location: [place];  Service: Radiology;  Laterality: N/A;   TEE WITHOUT CARDIOVERSION N/A 03/28/2016   Procedure: TRANSESOPHAGEAL ECHOCARDIOGRAM (TEE);  Surgeon: [name], MD;  Location: [place];  Service: Open Heart Surgery;  Laterality: N/A;   TONSILLECTOMY      There were no vitals filed for this visit.   Subjective Assessment - 05/25/21 1323     Subjective Pt states his pain is high today at 7/10.  States his pain is in his chest but it is normal to have it.  Denies any heart issues, shortness of breath.  Reports pain is in his muscle.    Currently in Pain? Yes    Pain Score 7     Pain Location Chest    Pain Descriptors / Indicators Aching                               [facility] Adult PT Treatment/Exercise - 05/25/21 0001       Knee/Hip Exercises: Standing   Heel Raises Both;20 reps    Knee Flexion Both;20 reps    Knee Flexion Limitations "marching" with 1 HHA    Forward Lunges Both;2 sets;10 reps;Limitations    Forward Lunges Limitations onto 4" step without UE assist.    Hip Abduction Both;20 reps    Hip Extension Both;20 reps    SLS with Vectors 5x5" with 1 UE assist    Other Standing Knee Exercises tandem stance 2 x 30" each solid floor      Knee/Hip Exercises: Seated   Sit to Sand 1 set;10 reps;without UE support                       PT Short Term Goals - 05/14/21 1443       PT SHORT TERM GOAL #1   Title Patient will be independent with HEP in order to improve functional outcomes.    Time 2    Period Weeks    Status New    Target Date 05/28/21      PT SHORT TERM GOAL #2   Title Improve LE strength/coordination/balance as evident by 10 sec SLS time    Baseline 5-10 sec    Time 2    Period Weeks    Status New    Target Date 05/28/21      PT SHORT TERM GOAL #3   Title Improve safety with ambulation as evidenced by score of 19/22 Dynamic Gait Index    Baseline 16/22    Time 2     Period Weeks    Status New    Target Date  05/28/21               PT Long Term Goals - 05/14/21 1445       PT LONG TERM GOAL #1   Title Decrease risk for falls per score 56/56 Berg Balance Test    Baseline 52/56    Time 4    Period Weeks    Status New    Target Date 06/11/21                   Plan - 05/25/21 1352     Clinical Impression Statement Continued with LE strengthening and stability exercises.  PT without c/o chest pain during session only LE weakness.  Added lunges without UE assist, SLS and vectors all to work on LE stability.  Cues to complete activities slowly and controlled.  Pt required 3 seated rest breaks during session today and requested to end session early as he was fatigued.    Personal Factors and Comorbidities Comorbidity 3+    Comorbidities hx of cardiac issues, psychiatric conditions, cervical fusion    Examination-Activity Limitations [place];Stairs;Locomotion Level    Examination-Participation Restrictions Cleaning;Shop    Stability/Clinical Decision Making Evolving/Moderate complexity    Rehab Potential Good    PT Frequency 1x / week    PT Duration 4 weeks    PT Treatment/Interventions ADLs/Self Care Home Management;Gait training;Stair training;Therapeutic activities;Therapeutic exercise;Balance training;Neuromuscular re-education;Manual techniques    PT Next Visit Plan Continue with LE strength and balance progression as tolerated. Issue weekly HEP.    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   Malnutrition of moderate degree 12/18/2020   Middle cerebral artery embolism, 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   Encounter for intubation    Renal insufficiency    Malignant hypertension    Acute ischemic left MCA stroke ([place]) 12/16/2020   Current smoker 06/21/2019   Seizure-like activity ([place]) 05/06/2019   Diabetes mellitus ([place]) 04/11/2019   Anemia, chronic disease 07/04/2018   Other specified diseases of the digestive system 10/30/2017   CAD (coronary artery disease) 03/28/2016   Chest pain 03/21/2016   Mixed hyperlipidemia 03/11/2016   Ischemic chest pain (HCC)    NSTEMI (non-ST elevated myocardial infarction) ([place])    Coronary atherosclerosis of native coronary artery 03/03/2016   Cervical spinal stenosis 12/14/2015   Loss of weight 08/25/2015   Essential hypertension, benign 07/23/2015   Personal history of noncompliance with medical treatment, presenting hazards to health 07/23/2015   Abdominal pain 06/08/2015   Constipation 06/08/2015   History of colonic polyps    Diverticulosis of colon without hemorrhage    Mucosal abnormality of stomach    Encounter for screening colonoscopy 04/16/2015   Dysphagia 04/16/2015   Partial symptomatic epilepsy with complex partial seizures, intractable, without status epilepticus ([place]) 11/10/2014   Bipolar disorder, unspecified ([place]) 10/15/2014   Schizophrenia ([place]) 10/15/2014   DM type 2 causing vascular disease ([place]) 06/29/2014   Musculoskeletal  pain 06/29/2014   Hyperglycemia without ketosis 09/07/2013   Degeneration disease of medial meniscus 01/13/2011   Other internal derangements of unspecified knee 01/13/2011   Knee pain 12/28/2010   Medial meniscus, posterior horn derangement 12/28/2010   Teena Irani, PTA/CLT, WTA [phone]  Teena Irani, PTA 05/25/2021, 1:54 PM  [place] [address] [place], [place], [address] Phone: [phone]   Fax:  [phone]  Name: LARRIE FRAIZER MRN: [id number] Date of Birth: [birth year]

## 2021-05-27 ENCOUNTER — Other Ambulatory Visit: Payer: Self-pay

## 2021-05-27 ENCOUNTER — Encounter (HOSPITAL_COMMUNITY): Payer: Self-pay

## 2021-05-27 ENCOUNTER — Ambulatory Visit (HOSPITAL_COMMUNITY): Payer: Medicaid Other

## 2021-05-27 DIAGNOSIS — R278 Other lack of coordination: Secondary | ICD-10-CM

## 2021-05-27 DIAGNOSIS — R29898 Other symptoms and signs involving the musculoskeletal system: Secondary | ICD-10-CM

## 2021-05-27 DIAGNOSIS — R2681 Unsteadiness on feet: Secondary | ICD-10-CM | POA: Diagnosis not present

## 2021-05-27 NOTE — Patient Instructions (Signed)
Theraputty Home Exercise Program  Complete 1-2 times a day.  putty squeeze  Pt. should squeeze putty in hand trying to keep it round by rotating putty after each squeeze. push fingers through putty to palm each time. Complete for ___3-5___ minutes.   PUTTY KEY GRIP  Hold the putty at the top of your hand. Squeeze the putty between your thumb and the side of your 2nd finger as shown. Complete for ___3-5_____ minutes.    PUTTY 3 JAW CHUCK  Roll up some putty into a ball then flatten it. Then, firmly squeeze it with your first 3 fingers as shown. Complete for __3-5____ minutes.           

## 2021-05-31 NOTE — Therapy (Addendum)
[place], [place], [address] Phone: [phone]   Fax:  [phone]  Occupational Therapy Evaluation  Patient Details  Name: Melvin [place] MRN: [id number] Date of Birth: Nov 19, 1965 Referring Provider (OT): [name], NP   Encounter Date: 05/27/2021   OT End of Session - 05/31/21 2251     Visit Number 1    Number of Visits 8    Date for OT Re-Evaluation 06/28/21    Authorization Type [place] Medicaid    Authorization Time Period Requesting 8 visits    Authorization - Visit Number 0    Authorization - Number of Visits 8    OT Start Time 1118    OT Stop Time 1150    OT Time Calculation (min) 32 min    Activity Tolerance Patient tolerated treatment well    Behavior During Therapy [facility] for tasks assessed/performed             Past Medical History:  Diagnosis Date   Anxiety    Bipolar 1 disorder ([place])    Bone spur    left heel   Cervical radiculopathy    Chest pain    Chronic back pain    Chronic chest wall pain    Chronic neck pain    Coronary artery disease    a. s/p CABG in 03/2016 with LIMA-LAD, SVG-D1, SVG-RCA, and Seq SVG-mid and distal OM   Depression    Diabetes mellitus    Type II   Hallucinations    "long history of them"   Headache(784.0)    History of gout    HTN (hypertension)    Insomnia    Myocardial infarction ([place]) 2017   Neuropathy    Pain management    Polysubstance abuse ([place])    Right leg pain    chronic   Schizophrenia ([place])    Seizures ([place])    last sz between July 5-9th, 2016; epilepsy   Shortness of breath dyspnea    with exertion   Sleep apnea    Transfusion of blood product refused for religious reason     Past Surgical History:  Procedure Laterality Date   ANTERIOR CERVICAL DECOMP/DISCECTOMY FUSION N/A 12/14/2015   Procedure: ANTERIOR CERVICAL DECOMPRESSION/DISCECTOMY FUSION CERVICAL FIVE -SIX;  Surgeon: Earnie Larsson, MD;  [facility];  Service:  Neurosurgery;  Laterality: N/A;   BIOPSY  05/07/2015   Procedure: BIOPSY (Gastric);  Surgeon: [name], MD;  Location: [facility];  Service: Endoscopy;;   CARDIAC CATHETERIZATION N/A 03/07/2016   Procedure: Left Heart Cath and Coronary Angiography;  Surgeon: Belva Crome, MD;  Location: [place] CV LAB;  Service: Cardiovascular;  Laterality: N/A;   COLONOSCOPY WITH PROPOFOL N/A 05/07/2015   [id number] diverticulosis, multiple colon polyps removed, tubular adenoma, serrated colon polyp. Next colonoscopy October 2019   COLONOSCOPY WITH PROPOFOL N/A 08/02/2018   Procedure: COLONOSCOPY WITH PROPOFOL;  Surgeon: [name], MD;  [facility];  Service: Endoscopy;  Laterality: N/A;  12:00pm   CORONARY ARTERY BYPASS GRAFT N/A 03/28/2016   Procedure: CORONARY ARTERY BYPASS GRAFTING (CABG) x 5 USING GREATER SAPHENOUS VEIN;  Surgeon: [name], MD;  Location: [place] OR;  Service: Open Heart Surgery;  Laterality: N/A;   ENDOVEIN HARVEST OF GREATER SAPHENOUS VEIN Right 03/28/2016   Procedure: ENDOVEIN HARVEST OF GREATER SAPHENOUS VEIN;  Surgeon: [name], MD;  Location: [place];  Service: Open Heart Surgery;  Laterality: Right;  ESOPHAGEAL DILATION N/A 05/07/2015   Procedure: ESOPHAGEAL DILATION WITH 56FR MALONEY DILATOR;  Surgeon: [name], MD;  [facility];  Service: Endoscopy;  Laterality: N/A;   ESOPHAGOGASTRODUODENOSCOPY (EGD) WITH PROPOFOL N/A 05/07/2015   RMR: Status post dilation of normal esophagus. Gastritis.   IR CT HEAD LTD  12/17/2020   IR CT HEAD LTD  12/17/2020   IR INTRA CRAN STENT  12/17/2020   IR PERCUTANEOUS ART THROMBECTOMY/INFUSION INTRACRANIAL INC DIAG ANGIO  12/17/2020   IR RADIOLOGIST EVAL & MGMT  03/25/2021   KNEE SURGERY Left    arthroscopy   MANDIBLE FRACTURE SURGERY     POLYPECTOMY  05/07/2015   Procedure: POLYPECTOMY (Hepatic Flexure, Distal Transverse Colon, Rectal);  Surgeon: [name], MD;  [facility];  Service: Endoscopy;;    RADIOLOGY WITH ANESTHESIA N/A 12/16/2020   Procedure: IR WITH ANESTHESIA;  Surgeon: Luanne Bras, MD;  Location: [place];  Service: Radiology;  Laterality: N/A;   TEE WITHOUT CARDIOVERSION N/A 03/28/2016   Procedure: TRANSESOPHAGEAL ECHOCARDIOGRAM (TEE);  Surgeon: [name], MD;  Location: [place];  Service: Open Heart Surgery;  Laterality: N/A;   TONSILLECTOMY      There were no vitals filed for this visit.   Subjective Assessment - 05/31/21 2234     Subjective  S: I'm having some trouble with my speech and memory.    Pertinent History Pt is a 55 y/o male S/P Left MCA CVA which occurred on 12/16/20. Patient spent 2 weeks in CIR at [facility]. No Home Health therapy was received. He reports that he has a home health aid every day assisting with meal prep and housekeeping tasks 9am-4pm. [name], NP has referred patient to occupational therapy for evaluation and treatment.    Patient Stated Goals To increase his strength.    Currently in Pain? No/denies                      05/27/21 1127  Assessment  Medical Diagnosis Left MCA CVA  Referring Provider (OT) [name], NP  Onset Date/Surgical Date 12/16/20  Hand Dominance Right  Next MD Visit 07/13/21 (Neurologist)  Prior Therapy received 2 weeks of therapy at CIR  Precautions  Precautions None  Restrictions  Weight Bearing Restrictions No  Balance Screen  Has the patient fallen in the past 6 months No  Home  Environment  Family/patient expects to be discharged to: Private residence  Additional [place] assist for meal prep and housekeeping tasks via home health aide. 9am-4pm every day. Aide also reminds him to complete ADL tasks if forgotten.  Prior Function  Level of Independence Independent with household mobility with device;Needs assistance with ADLs;Needs assistance with homemaking  Vocation On disability  ADL  ADL comments Difficulty with hand strength and UB strength.  Mobility  Mobility  Status Independent  Vision - History  Baseline Vision Wears glasses all the time  Observation/Other Assessments  Focus on Therapeutic Outcomes (FOTO)  N/A  Coordination  Gross Motor Movements are Fluid and Coordinated No  Fine Motor Movements are Fluid and Coordinated No  9 Hole Peg Test Left;Right  Right 9 Hole Peg Test 43.1"  Left 9 Hole Peg Test 34.3"  ROM / Strength  AROM / PROM / Strength Strength;AROM  Strength  Overall Strength Comments Assessed seated. IR/er adducted  Strength Assessment Site Shoulder;Hand  Right/Left Shoulder Left  Left Shoulder Flexion 5/5  Left Shoulder ABduction 4/5 (limited due to bilateral shoulder pain)  Left Shoulder External Rotation 5/5  Left Shoulder Internal Rotation 5/5  Right/Left hand Right;Left  Right Hand Lateral Pinch 10 lbs  Right Hand 3 Point Pinch 8 lbs  Left Hand Lateral Pinch 16 lbs  Left Hand 3 Point Pinch 10 lbs  Right Hand Grip (lbs) 25  Left Hand Grip (lbs) 40               OT Education - 05/31/21 2250     Education Details red putty - grip and pinch strength    Person(s) Educated Patient    Methods Explanation;Demonstration;Verbal cues;Handout    Comprehension Verbalized understanding              OT Short Term Goals - 05/31/21 2256       OT SHORT TERM GOAL #1   Title Pt will be educated and verbalize understanding of HEP in order to increase his bilateral hand coordination and strength in order to participate in every day self care and leisure tasks with less difficulty.    Time 4    Period Weeks    Status New    Target Date 06/28/21      OT SHORT TERM GOAL #2   Title Pt will increase his bilateral hand coordination by decreasing the time it task to complete the 9 hole peg test to the following: right hand: 10" faster; left hand: 8" faster.    Time 4    Period Weeks    Status New      OT SHORT TERM GOAL #3   Title Pt will increase his bilateral hand grip strength to 50# or better where  applicable in order to maintain a functional grasp on items without dropping them.    Time 4    Period Weeks    Status New      OT SHORT TERM GOAL #4   Title Pt will increase his bilateral hand pinch strength to average norms in order to manipulate small items successfully and without difficulty due to increased strength and dexterity.    Time 4    Period Weeks    Status New                      Plan - 05/31/21 2253     Clinical Impression Statement A: Pt is a 55 y/o male S/P bilateral hand weakness and decreased coordination causing increased difficulty completing fine motor tasks and functional daily tasks such as manipulating small items or opening tight or new jars.    OT Occupational Profile and History Problem Focused Assessment - Including review of records relating to presenting problem    Occupational performance deficits (Please refer to evaluation for details): ADL's;IADL's;Leisure    Body Structure / Function / Physical Skills Strength;Dexterity;Coordination;FMC    Rehab Potential Excellent    Clinical Decision Making Limited treatment options, no task modification necessary    Comorbidities Affecting Occupational Performance: May have comorbidities impacting occupational performance    Modification or Assistance to Complete Evaluation  No modification of tasks or assist necessary to complete eval    OT Frequency 2x / week    OT Duration 4 weeks    OT Treatment/Interventions Self-care/ADL training;DME and/or AE instruction;Splinting;Therapeutic activities;Ultrasound;Therapeutic exercise;Passive range of motion;Neuromuscular education;Cryotherapy;Electrical Stimulation;Paraffin;Manual Therapy;Patient/family education    Plan P: Pt will benefit from skilled OT services to increase his functional performance during ADL tasks that require fine motor coordination and hand strength. Treatment Plan: COMPLETE DASH. grip and pinch strengthening, coordination.  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   Malnutrition of moderate degree 12/18/2020   Middle cerebral artery embolism, 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   Encounter for intubation    Renal insufficiency    Malignant hypertension    Acute ischemic left MCA stroke ([place]) 12/16/2020   Current smoker 06/21/2019   Seizure-like activity ([place]) 05/06/2019   Diabetes mellitus ([place]) 04/11/2019   Anemia, chronic disease 07/04/2018   Other specified diseases of the digestive system 10/30/2017   CAD (coronary artery disease) 03/28/2016   Chest pain 03/21/2016   Mixed hyperlipidemia 03/11/2016   Ischemic chest pain (HCC)    NSTEMI (non-ST elevated myocardial infarction) ([place])    Coronary atherosclerosis of native coronary artery 03/03/2016   Cervical spinal stenosis 12/14/2015   Loss of weight 08/25/2015   Essential hypertension, benign 07/23/2015   Personal history of noncompliance with medical treatment, presenting hazards to health 07/23/2015   Abdominal pain 06/08/2015   Constipation 06/08/2015   History of colonic polyps    Diverticulosis of colon without hemorrhage    Mucosal abnormality of stomach    Encounter for screening colonoscopy 04/16/2015   Dysphagia 04/16/2015   Partial symptomatic epilepsy with complex partial seizures, intractable, without status epilepticus ([place]) 11/10/2014   Bipolar disorder, unspecified ([place]) 10/15/2014    Schizophrenia ([place]) 10/15/2014   DM type 2 causing vascular disease ([place]) 06/29/2014   Musculoskeletal pain 06/29/2014   Hyperglycemia without ketosis 09/07/2013   Degeneration disease of medial meniscus 01/13/2011   Other internal derangements of unspecified knee 01/13/2011   Knee pain 12/28/2010   Medial meniscus, posterior horn derangement 12/28/2010     Ailene Ravel, OTR/L,CBIS  [phone]  05/31/2021, 11:04 PM  [place] [address] [place], [place], [address] Phone: [phone]   Fax:  [phone]  Name: OCTAVIAN GODEK MRN: [id number] Date of Birth: [birth year]

## 2021-06-02 ENCOUNTER — Encounter (HOSPITAL_COMMUNITY): Payer: Self-pay | Admitting: Speech Pathology

## 2021-06-02 ENCOUNTER — Ambulatory Visit (HOSPITAL_COMMUNITY): Payer: Medicaid Other | Attending: Adult Health

## 2021-06-02 ENCOUNTER — Ambulatory Visit (HOSPITAL_COMMUNITY): Payer: Medicaid Other | Admitting: Speech Pathology

## 2021-06-02 ENCOUNTER — Other Ambulatory Visit: Payer: Self-pay

## 2021-06-02 DIAGNOSIS — I6981 Attention and concentration deficit following other cerebrovascular disease: Secondary | ICD-10-CM | POA: Insufficient documentation

## 2021-06-02 DIAGNOSIS — R29898 Other symptoms and signs involving the musculoskeletal system: Secondary | ICD-10-CM | POA: Diagnosis present

## 2021-06-02 DIAGNOSIS — R269 Unspecified abnormalities of gait and mobility: Secondary | ICD-10-CM | POA: Diagnosis present

## 2021-06-02 DIAGNOSIS — R278 Other lack of coordination: Secondary | ICD-10-CM | POA: Diagnosis present

## 2021-06-02 DIAGNOSIS — R2681 Unsteadiness on feet: Secondary | ICD-10-CM | POA: Diagnosis present

## 2021-06-02 DIAGNOSIS — R41841 Cognitive communication deficit: Secondary | ICD-10-CM | POA: Insufficient documentation

## 2021-06-02 NOTE — Therapy (Signed)
[name] [address] [place], [place], [address] Phone: [phone]   Fax:  [phone]  Speech Language Pathology Evaluation  Patient Details  Name: Melvin Taylor MRN: [id number] Date of Birth: [birth year] Referring Provider (SLP): [name], NP   Encounter Date: 06/02/2021   End of Session - 06/02/21 1108     Visit Number 1    Number of Visits 7    Authorization Type Medicaid [place] Access    SLP Start Time 1030    SLP Stop Time  1115    SLP Time Calculation (min) 45 min    Activity Tolerance Patient tolerated treatment well             Past Medical History:  Diagnosis Date   Anxiety    Bipolar 1 disorder ([place])    Bone spur    left heel   Cervical radiculopathy    Chest pain    Chronic back pain    Chronic chest wall pain    Chronic neck pain    Coronary artery disease    a. s/p CABG in 03/2016 with LIMA-LAD, SVG-D1, SVG-RCA, and Seq SVG-mid and distal OM   Depression    Diabetes mellitus    Type II   Hallucinations    "long history of them"   Headache(784.0)    History of gout    HTN (hypertension)    Insomnia    Myocardial infarction ([place]) 2017   Neuropathy    Pain management    Polysubstance abuse ([place])    Right leg pain    chronic   Schizophrenia ([place])    Seizures ([place])    last sz between July 5-9th, 2016; epilepsy   Shortness of breath dyspnea    with exertion   Sleep apnea    Transfusion of blood product refused for religious reason     Past Surgical History:  Procedure Laterality Date   ANTERIOR CERVICAL DECOMP/DISCECTOMY FUSION N/A 12/14/2015   Procedure: ANTERIOR CERVICAL DECOMPRESSION/DISCECTOMY FUSION CERVICAL FIVE -SIX;  Surgeon: Earnie Larsson, MD;  [facility]: [facility];  Service: Neurosurgery;  Laterality: N/A;   BIOPSY  05/07/2015   Procedure: BIOPSY (Gastric);  Surgeon: [name], MD;  Location: [facility];  Service: Endoscopy;;   CARDIAC CATHETERIZATION N/A 03/07/2016    Procedure: Left Heart Cath and Coronary Angiography;  Surgeon: Belva Crome, MD;  Location: [facility];  Service: Cardiovascular;  Laterality: N/A;   COLONOSCOPY WITH PROPOFOL N/A 05/07/2015   LMB:[id number] diverticulosis, multiple colon polyps removed, tubular adenoma, serrated colon polyp. Next colonoscopy October 2019   COLONOSCOPY WITH PROPOFOL N/A 08/02/2018   Procedure: COLONOSCOPY WITH PROPOFOL;  Surgeon: [name], MD;  [facility]: [facility];  Service: Endoscopy;  Laterality: N/A;  12:00pm   CORONARY ARTERY BYPASS GRAFT N/A 03/28/2016   Procedure: CORONARY ARTERY BYPASS GRAFTING (CABG) x 5 USING GREATER SAPHENOUS VEIN;  Surgeon: [name], MD;  Location: [facility];  Service: Open Heart Surgery;  Laterality: N/A;   ENDOVEIN HARVEST OF GREATER SAPHENOUS VEIN Right 03/28/2016   Procedure: ENDOVEIN HARVEST OF GREATER SAPHENOUS VEIN;  Surgeon: [name], MD;  Location: [facility];  Service: Open Heart Surgery;  Laterality: Right;   ESOPHAGEAL DILATION N/A 05/07/2015   Procedure: ESOPHAGEAL DILATION WITH 56FR MALONEY DILATOR;  Surgeon: [name], MD;  Location: [facility];  Service: Endoscopy;  Laterality: N/A;   ESOPHAGOGASTRODUODENOSCOPY (EGD) WITH PROPOFOL N/A 05/07/2015   RMR: Status post  dilation of normal esophagus. Gastritis.   IR CT HEAD LTD  12/17/2020   IR CT HEAD LTD  12/17/2020   IR INTRA CRAN STENT  12/17/2020   IR PERCUTANEOUS ART THROMBECTOMY/INFUSION INTRACRANIAL INC DIAG ANGIO  12/17/2020   IR RADIOLOGIST EVAL & MGMT  03/25/2021   KNEE SURGERY Left    arthroscopy   MANDIBLE FRACTURE SURGERY     POLYPECTOMY  05/07/2015   Procedure: POLYPECTOMY (Hepatic Flexure, Distal Transverse Colon, Rectal);  Surgeon: [name], MD;  Location: [facility];  Service: Endoscopy;;   RADIOLOGY WITH ANESTHESIA N/A 12/16/2020   Procedure: IR WITH ANESTHESIA;  Surgeon: Luanne Bras, MD;  Location: [facility];  Service: Radiology;  Laterality: N/A;   TEE WITHOUT CARDIOVERSION N/A  03/28/2016   Procedure: TRANSESOPHAGEAL ECHOCARDIOGRAM (TEE);  Surgeon: [name], MD;  Location: [facility];  Service: Open Heart Surgery;  Laterality: N/A;   TONSILLECTOMY      There were no vitals filed for this visit.   Subjective Assessment - 06/02/21 1048     Subjective "I have trouble with my memory and losing my words."    Special Tests SLUMS ([place] Mental Status Examination)    Currently in Pain? 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 Patient spent 2 weeks in CIR at [facility]. No [facility] therapy was received. He reports that he has a home health aid every day assisting with meal prep and housekeeping tasks 9am-4pm. [name], NP has referred patient to speech pathology for evaluation and treatment.      Balance Screen   Has the patient fallen in the past 6 months Yes    How many times? 3    Has the patient had a decrease in activity level because of a fear of falling?  Yes    Is the patient reluctant to leave their home because of a fear of falling?  No      Prior Functional Status   Cognitive/Linguistic Baseline Baseline deficits    Baseline deficit details Aunt helped with finances and memory    Type of Home Apartment     Lives With Alone    Available Support Family;Personal care attendant    Education finished 11th grade    Vocation On disability      Cognition   Overall Cognitive Status Impaired/Different from baseline    Area of Impairment Attention;Memory;Following commands    Current Attention Level Sustained    Memory Decreased short-term memory    Following Commands Follows one step commands consistently;Follows multi-step commands with increased time     Memory Impaired    Awareness Appears intact    Problem Solving Impaired    Problem Solving Impairment Functional complex    Executive Function Self Monitoring    Self Monitoring Impaired    Self Monitoring Impairment Verbal complex;Functional complex      Auditory Comprehension   Overall Auditory Comprehension Impaired    Yes/No Questions Impaired    Basic Biographical Questions 76-100% accurate    Complex Questions 50-74% accurate  Commands Impaired    One Step Basic Commands 75-100% accurate    Two Step Basic Commands 50-74% accurate    Multistep Basic Commands 50-74% accurate    Conversation Moderately complex    Interfering Components Attention;Working Micro[employer]      Reading Comprehension   Reading Status Within funtional limits      Expression   Primary Mode of Expression Verbal      Verbal Expression   Overall Verbal Expression Impaired    Initiation No impairment    Automatic Speech Name;Social Response    Level of Generative/Spontaneous Verbalization Conversation    Repetition Impaired    Level of Impairment Sentence level    Naming No impairment    Pragmatics No impairment    Interfering Components Attention    Effective Techniques Semantic cues    Non-Verbal Means of Communication Not applicable      Written Expression   Dominant Hand Right    Written Expression Exceptions to [facility]    Self Formulation Ability --   Pt wrote his name and phone number     Oral Motor/Sensory Function   Overall Oral Motor/Sensory Function Appears within functional limits for tasks assessed      Motor Speech   Overall Motor Speech Appears within functional limits for tasks assessed    Respiration Within functional limits    Phonation Normal    Resonance Within functional limits    Articulation Within  functional limitis    Intelligibility Intelligible    Motor Planning Witnin functional limits    Motor Speech Errors Not applicable    Phonation WFL      Standardized Assessments   Standardized Assessments  --   SLUMS 10/30               SLP Education - 06/02/21 1147     Education Details Plan for SLP treatment for attention, memory, auditory comprehension, and moderately complex verbal expression    Person(s) Educated Patient    Methods Explanation    Comprehension Verbalized understanding              SLP Short Term Goals - 06/02/21 1204       SLP SHORT TERM GOAL #1   Title Pt will record 3 or greater weekly appointments, reminders, to-do items in external memory devices to facilitate task completion with min cues from caregivers/SLP.    Baseline Family is completing this for him    Time 4    Period Weeks    Status New    Target Date 07/07/21      SLP SHORT TERM GOAL #2   Title Pt will increase recall for requested information to 90% acc during functional memory exercises with use of compensatory strategies as needed when provided min cues.    Baseline 66%    Time 4    Period Weeks    Status New    Target Date 07/07/21      SLP SHORT TERM GOAL #3   Title Pt will complete moderately complex visual scanning activities during pencil/paper tasks with 90% acc with min assist and use of strategies as needed.    Baseline 70% clock drawing    Time 4    Period Weeks    Status New    Target Date 07/07/21      SLP SHORT TERM GOAL #4  Title Pt will complete moderate level reasoning and problem solving tasks with 80% acc with min cues provided from SLP.    Baseline 60% acc    Time 4    Period Weeks    Status New    Target Date 07/07/21      SLP SHORT TERM GOAL #5   Title Pt will increase recall of short paragraphs to 80% acc via responding to short questions with mi/mod assist for use of recall strategies from SLP.    Baseline 20%    Time 4    Period Weeks     Status New    Target Date 07/07/21              SLP Long Term Goals - 06/02/21 1219       SLP LONG TERM GOAL #1   Title Same as short              Plan - 06/02/21 1148     Clinical Impression Statement Pt presents with moderate cognitive linguistic deficits characterized by impaired attention and memory which negatively impact complex auditory comprehension and verbal expression. Pt scored a 10/30 on the SLUMS with deficits in mental calculations, divergent naming (8 animals in one minute), immediate and short term memory (took 4 repetitions for immediate recall, recalled 3/5 after delay), digits in reverse, clock drawing (with evidence of right inattention), and story recall (0/8).  He lives in an apartment alone, but has a caregiver six days a week and family assists with personal finances and appointment scheduling. He required this assistance before his stroke in May, however he indicates that his memory is "worse" and that he loses his train of thought when talking and has difficulty expressing himself. Initially, after his stroke, he was quite aphasic and only able to speak in short sentences. Confrontation naming and picture description task (content and fluency) were WNL. He says he sleeps a lot during the day, watches TV, and completes word searches. He is motivated to improve his memory and language skills and has good family support. Pt will benefit from skilled SLP in order to address the above impairments, maximize independence, and decrease burden of care.    Speech Therapy Frequency 2x / week    Duration --   3-4 weeks   Treatment/Interventions Cognitive reorganization;SLP instruction and feedback;Internal/external aids;Compensatory strategies;Patient/family education;Cueing hierarchy;Language facilitation    Potential to Achieve Goals Fair    Potential Considerations Severity of impairments;Previous level of function    SLP Home Exercise Plan Pt will completed HEP as  assigned to facilitate carryover of treatment strategies and techniques in home environment with use of written cues as needed.    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   Malnutrition of moderate degree 12/18/2020   Middle cerebral artery embolism, 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   Encounter for intubation    Renal insufficiency    Malignant hypertension    Acute ischemic left MCA stroke ([place]) 12/16/2020   Current smoker 06/21/2019   Seizure-like activity ([place]) 05/06/2019   Diabetes mellitus ([place]) 04/11/2019   Anemia, chronic disease 07/04/2018   Other  specified diseases of the digestive system 10/30/2017   CAD (coronary artery disease) 03/28/2016   Chest pain 03/21/2016   Mixed hyperlipidemia 03/11/2016   Ischemic chest pain [facility])    NSTEMI (non-ST elevated myocardial infarction) [facility])    Coronary atherosclerosis of native coronary artery 03/03/2016   Cervical spinal stenosis 12/14/2015   Loss of weight 08/25/2015   Essential hypertension, benign 07/23/2015   Personal history of noncompliance with medical treatment, presenting hazards to health 07/23/2015   Abdominal pain 06/08/2015   Constipation 06/08/2015   History of colonic polyps    Diverticulosis of colon without hemorrhage    Mucosal abnormality of stomach    Encounter for screening colonoscopy 04/16/2015   Dysphagia 04/16/2015   Partial symptomatic epilepsy with complex partial seizures, intractable, without status epilepticus ([place]) 11/10/2014   Bipolar disorder, unspecified ([place]) 10/15/2014   Schizophrenia ([place]) 10/15/2014   DM type 2 causing vascular  disease ([place]) 06/29/2014   Musculoskeletal pain 06/29/2014   Hyperglycemia without ketosis 09/07/2013   Degeneration disease of medial meniscus 01/13/2011   Other internal derangements of unspecified knee 01/13/2011   Knee pain 12/28/2010   Medial meniscus, posterior horn derangement 12/28/2010   Thank you,  Genene Churn, [place]  Castin Donaghue 06/02/2021, 12:19 PM  [facility] [address] [place], [place], [address] Phone: [phone]   Fax:  [phone]  Name: KAUSHAL VANNICE MRN: [id number] Date of Birth: 22-Jun-1966

## 2021-06-02 NOTE — Therapy (Signed)
[facility] [address] [place], [place], [address] Phone: [phone]   Fax:  [phone]  Physical Therapy Treatment and D/C Summary  Patient Details  Name: Melvin Taylor MRN: [id number] Date of Birth: 29-Apr-1966 Referring Provider (PT): [name], NP  PHYSICAL THERAPY DISCHARGE SUMMARY  Visits from Start of Care: 4  Current functional level related to goals / functional outcomes: Met all STG/LTG, low risk for falls   Remaining deficits: none   Education / Equipment: HEP   Patient agrees to discharge. Patient goals were met. Patient is being discharged due to meeting the stated rehab goals.  Encounter Date: 06/02/2021   PT End of Session - 06/02/21 1119     Visit Number 4    Number of Visits 4    Date for PT Re-Evaluation 06/11/21    Authorization Type Medicaid [place] Time Period 3 visits approved 10/15-11/4/22    PT Start Time 1115    PT Stop Time 1145   D/C visit   PT Time Calculation (min) 30 min    Activity Tolerance Patient tolerated treatment well    Behavior During Therapy [facility] for tasks assessed/performed             Past Medical History:  Diagnosis Date   Anxiety    Bipolar 1 disorder ([place])    Bone spur    left heel   Cervical radiculopathy    Chest pain    Chronic back pain    Chronic chest wall pain    Chronic neck pain    Coronary artery disease    a. s/p CABG in 03/2016 with LIMA-LAD, SVG-D1, SVG-RCA, and Seq SVG-mid and distal OM   Depression    Diabetes mellitus    Type II   Hallucinations    "long history of them"   Headache(784.0)    History of gout    HTN (hypertension)    Insomnia    Myocardial infarction ([place]) 2017   Neuropathy    Pain management    Polysubstance abuse ([place])    Right leg pain    chronic   Schizophrenia ([place])    Seizures ([place])    last sz between July 5-9th, 2016; epilepsy   Shortness of breath dyspnea    with exertion   Sleep apnea     Transfusion of blood product refused for religious reason     Past Surgical History:  Procedure Laterality Date   ANTERIOR CERVICAL DECOMP/DISCECTOMY FUSION N/A 12/14/2015   Procedure: ANTERIOR CERVICAL DECOMPRESSION/DISCECTOMY FUSION CERVICAL FIVE -SIX;  Surgeon: Earnie Larsson, MD;  [facility];  Service: Neurosurgery;  Laterality: N/A;   BIOPSY  05/07/2015   Procedure: BIOPSY (Gastric);  Surgeon: [name], MD;  Location: [facility];  Service: Endoscopy;;   CARDIAC CATHETERIZATION N/A 03/07/2016   Procedure: Left Heart Cath and Coronary Angiography;  Surgeon: Belva Crome, MD;  Location: [facility];  Service: Cardiovascular;  Laterality: N/A;   COLONOSCOPY WITH PROPOFOL N/A 05/07/2015   EPP:IRJJOACZYS diverticulosis, multiple colon polyps removed, tubular adenoma, serrated colon polyp. Next colonoscopy October 2019   COLONOSCOPY WITH PROPOFOL N/A 08/02/2018   Procedure: COLONOSCOPY WITH PROPOFOL;  Surgeon: [name], MD;  [facility];  Service: Endoscopy;  Laterality: N/A;  12:00pm   CORONARY ARTERY BYPASS GRAFT N/A 03/28/2016   Procedure: CORONARY ARTERY BYPASS GRAFTING (CABG) x 5 USING GREATER SAPHENOUS VEIN;  Surgeon: [name], MD;  [facility];  Service: Open Heart Surgery;  Laterality: N/A;   ENDOVEIN HARVEST OF GREATER SAPHENOUS VEIN Right 03/28/2016   Procedure: ENDOVEIN HARVEST OF GREATER SAPHENOUS VEIN;  Surgeon: [name], MD;  Location: [facility];  Service: Open Heart Surgery;  Laterality: Right;   ESOPHAGEAL DILATION N/A 05/07/2015   Procedure: ESOPHAGEAL DILATION WITH 56FR MALONEY DILATOR;  Surgeon: [name], MD;  [facility];  Service: Endoscopy;  Laterality: N/A;   ESOPHAGOGASTRODUODENOSCOPY (EGD) WITH PROPOFOL N/A 05/07/2015   RMR: Status post dilation of normal esophagus. Gastritis.   IR CT HEAD LTD  12/17/2020   IR CT HEAD LTD  12/17/2020   IR INTRA CRAN STENT  12/17/2020   IR PERCUTANEOUS ART THROMBECTOMY/INFUSION  INTRACRANIAL INC DIAG ANGIO  12/17/2020   IR RADIOLOGIST EVAL & MGMT  03/25/2021   KNEE SURGERY Left    arthroscopy   MANDIBLE FRACTURE SURGERY     POLYPECTOMY  05/07/2015   Procedure: POLYPECTOMY (Hepatic Flexure, Distal Transverse Colon, Rectal);  Surgeon: [name], MD;  [facility];  Service: Endoscopy;;   RADIOLOGY WITH ANESTHESIA N/A 12/16/2020   Procedure: IR WITH ANESTHESIA;  Surgeon: Luanne Bras, MD;  Location: [facility];  Service: Radiology;  Laterality: N/A;   TEE WITHOUT CARDIOVERSION N/A 03/28/2016   Procedure: TRANSESOPHAGEAL ECHOCARDIOGRAM (TEE);  Surgeon: [name], MD;  Location: [facility];  Service: Open Heart Surgery;  Laterality: N/A;   TONSILLECTOMY      There were no vitals filed for this visit.   Subjective Assessment - 06/02/21 1119     Subjective Pt reports no new issues. Reports he may have fallen down one time since last session, but can't recall.    Limitations Walking    Currently in Pain? No/denies    Pain Score 0-No pain                [facility] PT Assessment - 06/02/21 1120       Berg Balance Test   Sit to Stand Able to stand without using hands and stabilize independently    Standing Unsupported Able to stand safely 2 minutes    Sitting with Back Unsupported but Feet Supported on Floor or Stool Able to sit safely and securely 2 minutes    Stand to Sit Sits safely with minimal use of hands    Transfers Able to transfer safely, minor use of hands    Standing Unsupported with Eyes Closed Able to stand 10 seconds safely    Standing Unsupported with Feet Together Able to place feet together independently and stand 1 minute safely    From Standing, Reach Forward with Outstretched Arm Can reach confidently >25 cm (10")    From Standing Position, Pick up Object from Floor Able to pick up shoe safely and easily    From Standing Position, Turn to Look Behind Over each Shoulder Looks behind from both sides and weight shifts well    Turn 360  Degrees Able to turn 360 degrees safely in 4 seconds or less    Standing Unsupported, Alternately Place Feet on Step/Stool Able to stand independently and safely and complete 8 steps in 20 seconds    Standing Unsupported, One Foot in Front Able to place foot tandem independently and hold 30 seconds    Standing on One Leg Able to lift leg independently and hold > 10 seconds    Total Score 56      Dynamic Gait Index   Level Surface Normal  Change in Gait Speed Normal    Gait with Horizontal Head Turns Normal    Gait with Vertical Head Turns Normal    Gait and Pivot Turn Normal    Step Over Obstacle Normal    Step Around Obstacles Normal    Steps Normal    Total Score 24                                      PT Short Term Goals - 06/02/21 1130       PT SHORT TERM GOAL #1   Title Patient will be independent with HEP in order to improve functional outcomes.    Time 2    Period Weeks    Status Achieved    Target Date 05/28/21      PT SHORT TERM GOAL #2   Title Improve LE strength/coordination/balance as evident by 10 sec SLS time    Baseline 5-10 sec at start of care, 10-12 sec at present    Time 2    Period Weeks    Status Achieved    Target Date 05/28/21      PT SHORT TERM GOAL #3   Title Improve safety with ambulation as evidenced by score of 19/22 Dynamic Gait Index    Baseline 16/24 at start of care; 24/24 at present    Time 2    Period Weeks    Status Achieved    Target Date 05/28/21               PT Long Term Goals - 06/02/21 1132       PT LONG TERM GOAL #1   Title Decrease risk for falls per score 56/56 Berg Balance Test    Baseline 52/56 at start of care; 56/56 presently    Time 4    Period Weeks    Status Achieved                   Plan - 06/02/21 1144     Clinical Impression Statement Pt has met all STG/LTG and demonstrating low risk for falls.  Pt reports that he is largely sedentary and typically spends his  day sleeping or watching TV. Therapist encouraged pt to be more physically active and attend local senior center or fitness center for continued physical activity. D/C to HEP at this time    Personal Factors and Comorbidities Comorbidity 3+    Comorbidities hx of cardiac issues, psychiatric conditions, cervical fusion    Examination-Activity Limitations [place];Stairs;Locomotion Level    Examination-Participation Restrictions Cleaning;Shop    Stability/Clinical Decision Making Evolving/Moderate complexity    Rehab Potential Good    PT Frequency 1x / week    PT Duration 4 weeks    PT Treatment/Interventions ADLs/Self Care Home Management;Gait training;Stair training;Therapeutic activities;Therapeutic exercise;Balance training;Neuromuscular re-education;Manual techniques    PT Next Visit Plan Continue with LE strength and balance progression as tolerated. Issue weekly HEP.    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   Malnutrition of moderate degree 12/18/2020   Middle cerebral artery embolism, 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   Encounter for intubation    Renal insufficiency    Malignant hypertension    Acute ischemic left MCA stroke ([place]) 12/16/2020   Current smoker 06/21/2019   Seizure-like activity ([place]) 05/06/2019   Diabetes mellitus ([place]) 04/11/2019   Anemia, chronic disease 07/04/2018   Other specified diseases of the  digestive system 10/30/2017   CAD (coronary artery disease) 03/28/2016   Chest pain 03/21/2016   Mixed hyperlipidemia 03/11/2016   Ischemic chest pain ([place])    NSTEMI (non-ST elevated myocardial infarction) ([place])    Coronary atherosclerosis of native coronary artery 03/03/2016   Cervical spinal stenosis 12/14/2015   Loss of weight 08/25/2015   Essential hypertension, benign 07/23/2015   Personal history of noncompliance with medical treatment, presenting hazards to health 07/23/2015   Abdominal pain 06/08/2015   Constipation 06/08/2015   History of colonic polyps    Diverticulosis of colon without hemorrhage    Mucosal abnormality of stomach    Encounter for screening colonoscopy 04/16/2015   Dysphagia 04/16/2015   Partial symptomatic epilepsy with complex partial seizures, intractable, without status epilepticus ([place]) 11/10/2014   Bipolar disorder, unspecified ([place]) 10/15/2014   Schizophrenia ([place]) 10/15/2014   DM type 2 causing vascular disease ([place]) 06/29/2014   Musculoskeletal pain 06/29/2014   Hyperglycemia without ketosis 09/07/2013   Degeneration disease of medial meniscus 01/13/2011   Other internal derangements of unspecified knee 01/13/2011   Knee pain 12/28/2010   Medial meniscus, posterior horn derangement 12/28/2010    Toniann Fail, PT 06/02/2021, 11:49 AM  [facility] [address] [place], [place], [address] Phone: [phone]   Fax:  [phone]  Name: LEARY MCNULTY MRN: [id number] Date of Birth: 10/03/1965

## 2021-06-04 ENCOUNTER — Other Ambulatory Visit: Payer: Self-pay

## 2021-06-04 ENCOUNTER — Encounter (HOSPITAL_COMMUNITY): Payer: Self-pay

## 2021-06-04 ENCOUNTER — Ambulatory Visit (HOSPITAL_COMMUNITY): Payer: Medicaid Other

## 2021-06-04 DIAGNOSIS — R278 Other lack of coordination: Secondary | ICD-10-CM

## 2021-06-04 DIAGNOSIS — R29898 Other symptoms and signs involving the musculoskeletal system: Secondary | ICD-10-CM

## 2021-06-04 NOTE — Therapy (Addendum)
[facility] [address] [place], [place], [address] Phone: [phone]   Fax:  [phone]  Occupational Therapy Treatment  Patient Details  Name: Melvin Taylor MRN: [id number] Date of Birth: [birth year] Referring Provider (OT): [name], NP   Encounter Date: 06/04/2021   OT End of Session - 06/04/21 1054     Visit Number 2    Number of Visits 8    Date for OT Re-Evaluation 06/28/21    Authorization Type [place] Medicaid    Authorization Time Period Approved 8 visits (06/04/21-07/01/21)    Authorization - Visit Number 1    Authorization - Number of Visits 8    OT Start Time 1000    OT Stop Time 1884    OT Time Calculation (min) 38 min    Activity Tolerance Patient tolerated treatment well    Behavior During Therapy [facility] for tasks assessed/performed             Past Medical History:  Diagnosis Date   Anxiety    Bipolar 1 disorder ([place])    Bone spur    left heel   Cervical radiculopathy    Chest pain    Chronic back pain    Chronic chest wall pain    Chronic neck pain    Coronary artery disease    a. s/p CABG in 03/2016 with LIMA-LAD, SVG-D1, SVG-RCA, and Seq SVG-mid and distal OM   Depression    Diabetes mellitus    Type II   Hallucinations    "long history of them"   Headache(784.0)    History of gout    HTN (hypertension)    Insomnia    Myocardial infarction ([place]) 2017   Neuropathy    Pain management    Polysubstance abuse ([place])    Right leg pain    chronic   Schizophrenia ([place])    Seizures ([place])    last sz between July 5-9th, 2016; epilepsy   Shortness of breath dyspnea    with exertion   Sleep apnea    Transfusion of blood product refused for religious reason     Past Surgical History:  Procedure Laterality Date   ANTERIOR CERVICAL DECOMP/DISCECTOMY FUSION N/A 12/14/2015   Procedure: ANTERIOR CERVICAL DECOMPRESSION/DISCECTOMY FUSION CERVICAL FIVE -SIX;  Surgeon: Earnie Larsson, MD;  [facility];  Service: Neurosurgery;  Laterality: N/A;   BIOPSY  05/07/2015   Procedure: BIOPSY (Gastric);  Surgeon: [name], MD;  [facility];  Service: Endoscopy;;   CARDIAC CATHETERIZATION N/A 03/07/2016   Procedure: Left Heart Cath and Coronary Angiography;  Surgeon: Belva Crome, MD;  Location: [facility];  Service: Cardiovascular;  Laterality: N/A;   COLONOSCOPY WITH PROPOFOL N/A 05/07/2015   ZYS:[id number] diverticulosis, multiple colon polyps removed, tubular adenoma, serrated colon polyp. Next colonoscopy October 2019   COLONOSCOPY WITH PROPOFOL N/A 08/02/2018   Procedure: COLONOSCOPY WITH PROPOFOL;  Surgeon: [name], MD;  [facility];  Service: Endoscopy;  Laterality: N/A;  12:00pm   CORONARY ARTERY BYPASS GRAFT N/A 03/28/2016   Procedure: CORONARY ARTERY BYPASS GRAFTING (CABG) x 5 USING GREATER SAPHENOUS VEIN;  Surgeon: [name], MD;  Location: [facility];  Service: Open Heart Surgery;  Laterality: N/A;   ENDOVEIN HARVEST OF GREATER SAPHENOUS VEIN Right 03/28/2016   Procedure: ENDOVEIN HARVEST OF GREATER SAPHENOUS VEIN;  Surgeon: [name], MD;  Location: [facility];  Service: Open Heart Surgery;  Laterality: Right;  ESOPHAGEAL DILATION N/A 05/07/2015   Procedure: ESOPHAGEAL DILATION WITH 56FR MALONEY DILATOR;  Surgeon: [name], MD;  [facility];  Service: Endoscopy;  Laterality: N/A;   ESOPHAGOGASTRODUODENOSCOPY (EGD) WITH PROPOFOL N/A 05/07/2015   RMR: Status post dilation of normal esophagus. Gastritis.   IR CT HEAD LTD  12/17/2020   IR CT HEAD LTD  12/17/2020   IR INTRA CRAN STENT  12/17/2020   IR PERCUTANEOUS ART THROMBECTOMY/INFUSION INTRACRANIAL INC DIAG ANGIO  12/17/2020   IR RADIOLOGIST EVAL & MGMT  03/25/2021   KNEE SURGERY Left    arthroscopy   MANDIBLE FRACTURE SURGERY     POLYPECTOMY  05/07/2015   Procedure: POLYPECTOMY (Hepatic Flexure, Distal Transverse Colon, Rectal);  Surgeon: [name], MD;  [facility];  Service:  Endoscopy;;   RADIOLOGY WITH ANESTHESIA N/A 12/16/2020   Procedure: IR WITH ANESTHESIA;  Surgeon: Luanne Bras, MD;  Location: [facility];  Service: Radiology;  Laterality: N/A;   TEE WITHOUT CARDIOVERSION N/A 03/28/2016   Procedure: TRANSESOPHAGEAL ECHOCARDIOGRAM (TEE);  Surgeon: [name], MD;  Location: [facility];  Service: Open Heart Surgery;  Laterality: N/A;   TONSILLECTOMY      There were no vitals filed for this visit.   Subjective Assessment - 06/04/21 1004     Subjective  S: My right hand is sore.    Currently in Pain? Yes    Pain Score 6     Pain Location Hand    Pain Orientation Right    Pain Descriptors / Indicators Sore    Pain Type Acute pain    Pain Onset In the past 7 days    Pain Frequency Constant    Aggravating Factors  using putty    Pain Relieving Factors nothing    Effect of Pain on Daily Activities moderate effect    Multiple Pain Sites No                [facility] OT Assessment - 06/04/21 1011       Assessment   Medical Diagnosis Left MCA CVA      Precautions   Precautions None                Quick Dash - 06/04/21 1006     Open a tight or new jar Unable    Do heavy household chores (wash walls, wash floors) No difficulty   Aide completes   Carry a shopping bag or briefcase No difficulty    Wash your back Mild difficulty    Use a knife to cut food Unable    Recreational activities in which you take some force or impact through your arm, shoulder, or hand (golf, hammering, tennis) Mild difficulty    During the past week, to what extent has your arm, shoulder or hand problem interfered with your normal social activities with family, friends, neighbors, or groups? Not at all    During the past week, to what extent has your arm, shoulder or hand problem limited your work or other regular daily activities Slightly    Arm, shoulder, or hand pain. Mild    Tingling (pins and needles) in your arm, shoulder, or hand Extreme    Difficulty Sleeping  No difficulty    DASH Score 36.36 %                  OT Treatments/Exercises (OP) - 06/04/21 1012       Exercises   Exercises Hand  Hand Exercises   Sponges Left: 23 (all of them) Right: 14    Other Hand Exercises Left hand: utilized green clothespin and 3 point pinch to pick up 15 sponges. Right hand:      Fine Motor Coordination (Hand/Wrist)   Fine Motor Coordination Grooved pegs    Grooved pegs Completed grooved pegboard with both hands. Half of board completed with each hand.                    OT Education - 06/04/21 1053     Education Details reviewed goals. Recommended decreasing his HEP to once a day and/or letting his right hand take a break from completing since it's presenting with increased soreness.    Person(s) Educated Patient    Methods Explanation    Comprehension Verbalized understanding              OT Short Term Goals - 06/04/21 1055       OT SHORT TERM GOAL #1   Title Pt will be educated and verbalize understanding of HEP in order to increase his bilateral hand coordination and strength in order to participate in every day self care and leisure tasks with less difficulty.    Time 4    Period Weeks    Status On-going    Target Date 06/28/21      OT SHORT TERM GOAL #2   Title Pt will increase his bilateral hand coordination by decreasing the time it task to complete the 9 hole peg test to the following: right hand: 10" faster; left hand: 8" faster.    Time 4    Period Weeks    Status On-going      OT SHORT TERM GOAL #3   Title Pt will increase his bilateral hand grip strength to 50# or better where applicable in order to maintain a functional grasp on items without dropping them.    Time 4    Period Weeks    Status On-going      OT SHORT TERM GOAL #4   Title Pt will increase his bilateral hand pinch strength to average norms in order to manipulate small items successfully and without difficulty due to increased strength  and dexterity.    Time 4    Period Weeks    Status On-going                    Clinical impression statement: Pt arrived with reports of increased soreness to his right hand that increased with use and activity. Activities were modified and pain was monitored with the majority of focus placed on the left hand during session. Increased required to complete coordination task with pegboard due to moderate difficulty. Pt utilized both hand to complete. VC for form and technique were provided.   Plan - 06/04/21 1054     Body Structure / Function / Physical Skills Strength;Dexterity;Coordination;FMC    Plan P: Follow up on right hand soreness. Complete handgripper task for strengthening. in hand manipulation task to work on coordination.    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   Malnutrition of moderate degree 12/18/2020   Middle cerebral artery embolism, 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   Encounter for intubation    Renal insufficiency    Malignant hypertension    Acute ischemic left MCA stroke ([place]) 12/16/2020   Current smoker 06/21/2019   Seizure-like activity ([place]) 05/06/2019   Diabetes mellitus ([place]) 04/11/2019   Anemia, chronic disease 07/04/2018   Other specified diseases of the digestive system 10/30/2017   CAD (coronary artery disease) 03/28/2016   Chest pain 03/21/2016   Mixed hyperlipidemia 03/11/2016   Ischemic chest pain ([facility])    NSTEMI (non-ST elevated myocardial infarction) ([place])    Coronary atherosclerosis of  native coronary artery 03/03/2016   Cervical spinal stenosis 12/14/2015   Loss of weight 08/25/2015   Essential hypertension, benign 07/23/2015   Personal history of noncompliance with medical treatment, presenting hazards to health 07/23/2015   Abdominal pain 06/08/2015   Constipation 06/08/2015   History of colonic polyps    Diverticulosis of colon without hemorrhage    Mucosal abnormality of stomach    Encounter for screening colonoscopy 04/16/2015   Dysphagia 04/16/2015   Partial symptomatic epilepsy with complex partial seizures, intractable, without status epilepticus ([place]) 11/10/2014   Bipolar disorder, unspecified ([place]) 10/15/2014   Schizophrenia ([place]) 10/15/2014   DM type 2 causing vascular disease ([place]) 06/29/2014   Musculoskeletal pain 06/29/2014   Hyperglycemia without ketosis 09/07/2013   Degeneration disease of medial meniscus 01/13/2011   Other internal derangements of unspecified knee 01/13/2011   Knee pain 12/28/2010   Medial meniscus, posterior horn derangement 12/28/2010   Ailene Ravel, OTR/L,CBIS  [phone]  06/04/2021, 10:55 AM  [facility] [address] [place], [place], [address] Phone: [phone]   Fax:  [phone]  Name: Melvin Taylor MRN: [id number] Date of Birth: 06-Aug-1965

## 2021-06-09 ENCOUNTER — Ambulatory Visit (HOSPITAL_COMMUNITY): Payer: Medicaid Other

## 2021-06-10 ENCOUNTER — Ambulatory Visit (HOSPITAL_COMMUNITY): Payer: Medicaid Other | Admitting: Speech Pathology

## 2021-06-11 ENCOUNTER — Ambulatory Visit (HOSPITAL_COMMUNITY): Payer: Medicaid Other

## 2021-06-11 ENCOUNTER — Other Ambulatory Visit: Payer: Self-pay | Admitting: Diagnostic Neuroimaging

## 2021-06-11 ENCOUNTER — Encounter (HOSPITAL_COMMUNITY): Payer: Self-pay

## 2021-06-11 ENCOUNTER — Other Ambulatory Visit: Payer: Self-pay

## 2021-06-11 DIAGNOSIS — G40219 Localization-related (focal) (partial) symptomatic epilepsy and epileptic syndromes with complex partial seizures, intractable, without status epilepticus: Secondary | ICD-10-CM

## 2021-06-11 DIAGNOSIS — R278 Other lack of coordination: Secondary | ICD-10-CM

## 2021-06-11 DIAGNOSIS — R29898 Other symptoms and signs involving the musculoskeletal system: Secondary | ICD-10-CM

## 2021-06-11 NOTE — Therapy (Signed)
[facility] [address] [place], [place], [address] Phone: [phone]   Fax:  [phone]  Occupational Therapy Treatment  Patient Details  Name: Melvin Taylor MRN: [id number] Date of Birth: 1966-05-03 Referring Provider (OT): [name], NP   Encounter Date: 06/11/2021   OT End of Session - 06/11/21 1149     Visit Number 3    Number of Visits 8    Date for OT Re-Evaluation 06/28/21    Authorization Type [place] Medicaid    Authorization Time Period Approved 8 visits (06/04/21-07/01/21)    Authorization - Visit Number 2    Authorization - Number of Visits 8    OT Start Time 1115    OT Stop Time 1153    OT Time Calculation (min) 38 min    Activity Tolerance Patient tolerated treatment well    Behavior During Therapy [facility] for tasks assessed/performed             Past Medical History:  Diagnosis Date   Anxiety    Bipolar 1 disorder ([place])    Bone spur    left heel   Cervical radiculopathy    Chest pain    Chronic back pain    Chronic chest wall pain    Chronic neck pain    Coronary artery disease    a. s/p CABG in 03/2016 with LIMA-LAD, SVG-D1, SVG-RCA, and Seq SVG-mid and distal OM   Depression    Diabetes mellitus    Type II   Hallucinations    "long history of them"   Headache(784.0)    History of gout    HTN (hypertension)    Insomnia    Myocardial infarction ([place]) 2017   Neuropathy    Pain management    Polysubstance abuse ([place])    Right leg pain    chronic   Schizophrenia ([place])    Seizures ([place])    last sz between July 5-9th, 2016; epilepsy   Shortness of breath dyspnea    with exertion   Sleep apnea    Transfusion of blood product refused for religious reason     Past Surgical History:  Procedure Laterality Date   ANTERIOR CERVICAL DECOMP/DISCECTOMY FUSION N/A 12/14/2015   Procedure: ANTERIOR CERVICAL DECOMPRESSION/DISCECTOMY FUSION CERVICAL FIVE -SIX;  Surgeon: Earnie Larsson, MD;  [facility];  Service: Neurosurgery;  Laterality: N/A;   BIOPSY  05/07/2015   Procedure: BIOPSY (Gastric);  Surgeon: [name], MD;  [facility];  Service: Endoscopy;;   CARDIAC CATHETERIZATION N/A 03/07/2016   Procedure: Left Heart Cath and Coronary Angiography;  Surgeon: Belva Crome, MD;  Location: [place] [facility];  Service: Cardiovascular;  Laterality: N/A;   COLONOSCOPY WITH PROPOFOL N/A 05/07/2015   URK:[id number] diverticulosis, multiple colon polyps removed, tubular adenoma, serrated colon polyp. Next colonoscopy October 2019   COLONOSCOPY WITH PROPOFOL N/A 08/02/2018   Procedure: COLONOSCOPY WITH PROPOFOL;  Surgeon: [name], MD;  [facility];  Service: Endoscopy;  Laterality: N/A;  12:00pm   CORONARY ARTERY BYPASS GRAFT N/A 03/28/2016   Procedure: CORONARY ARTERY BYPASS GRAFTING (CABG) x 5 USING GREATER SAPHENOUS VEIN;  Surgeon: [name], MD;  Location: [facility] OR;  Service: Open Heart Surgery;  Laterality: N/A;   ENDOVEIN HARVEST OF GREATER SAPHENOUS VEIN Right 03/28/2016   Procedure: ENDOVEIN HARVEST OF GREATER SAPHENOUS VEIN;  Surgeon: [name], MD;  Location: [facility];  Service: Open Heart Surgery;  Laterality: Right;  ESOPHAGEAL DILATION N/A 05/07/2015   Procedure: ESOPHAGEAL DILATION WITH 56FR MALONEY DILATOR;  Surgeon: [name], MD;  [facility];  Service: Endoscopy;  Laterality: N/A;   ESOPHAGOGASTRODUODENOSCOPY (EGD) WITH PROPOFOL N/A 05/07/2015   RMR: Status post dilation of normal esophagus. Gastritis.   IR CT HEAD LTD  12/17/2020   IR CT HEAD LTD  12/17/2020   IR INTRA CRAN STENT  12/17/2020   IR PERCUTANEOUS ART THROMBECTOMY/INFUSION INTRACRANIAL INC DIAG ANGIO  12/17/2020   IR RADIOLOGIST EVAL & MGMT  03/25/2021   KNEE SURGERY Left    arthroscopy   MANDIBLE FRACTURE SURGERY     POLYPECTOMY  05/07/2015   Procedure: POLYPECTOMY (Hepatic Flexure, Distal Transverse Colon, Rectal);  Surgeon: [name], MD;  [facility];  Service:  Endoscopy;;   RADIOLOGY WITH ANESTHESIA N/A 12/16/2020   Procedure: IR WITH ANESTHESIA;  Surgeon: Luanne Bras, MD;  Location: [facility];  Service: Radiology;  Laterality: N/A;   TEE WITHOUT CARDIOVERSION N/A 03/28/2016   Procedure: TRANSESOPHAGEAL ECHOCARDIOGRAM (TEE);  Surgeon: [name], MD;  Location: [facility];  Service: Open Heart Surgery;  Laterality: N/A;   TONSILLECTOMY      There were no vitals filed for this visit.   Subjective Assessment - 06/11/21 1119     Subjective  S: I can't bend my right hand.    Currently in Pain? Yes    Pain Score 6     Pain Location Hand    Pain Orientation Right    Pain Descriptors / Indicators Sore    Pain Type Acute pain    Pain Onset 1 to 4 weeks ago    Pain Frequency Occasional    Aggravating Factors  when trying to use it or make a fist    Pain Relieving Factors when resting it    Effect of Pain on Daily Activities severe-moderate effect    Multiple Pain Sites No                [facility] OT Assessment - 06/11/21 1121       Assessment   Medical Diagnosis Left MCA CVA      Precautions   Precautions None                      OT Treatments/Exercises (OP) - 06/11/21 1121       Exercises   Exercises Hand      Hand Exercises   Sponges Left: 23 (all of them) Right: 16      Fine Motor Coordination (Hand/Wrist)   Fine Motor Coordination Picking up coins;Manipulating coins;Stacking coins;In hand manipuation training    In Hand Manipulation Training Picked up coin stack and placed coins in coin bank slot. Completed 2 stacks with left hand and 2 stacks with right hand.    Picking up coins Completed with both right hand and left hand individually. picked up 5 coins one at a time transferred to palm from fingertip.    Manipulating coins Transfered 5 coins one at a time from palm to finger tip   Completed with right and left hand indivisually   Stacking coins Pt created 3 stacks of coins (5 pennies in each)   completed  with right and left hand individually.                     OT Short Term Goals - 06/04/21 1055       OT SHORT TERM GOAL #1  Title Pt will be educated and verbalize understanding of HEP in order to increase his bilateral hand coordination and strength in order to participate in every day self care and leisure tasks with less difficulty.    Time 4    Period Weeks    Status On-going    Target Date 06/28/21      OT SHORT TERM GOAL #2   Title Pt will increase his bilateral hand coordination by decreasing the time it task to complete the 9 hole peg test to the following: right hand: 10" faster; left hand: 8" faster.    Time 4    Period Weeks    Status On-going      OT SHORT TERM GOAL #3   Title Pt will increase his bilateral hand grip strength to 50# or better where applicable in order to maintain a functional grasp on items without dropping them.    Time 4    Period Weeks    Status On-going      OT SHORT TERM GOAL #4   Title Pt will increase his bilateral hand pinch strength to average norms in order to manipulate small items successfully and without difficulty due to increased strength and dexterity.    Time 4    Period Weeks    Status On-going                      Plan - 06/11/21 1149     Clinical Impression Statement A: Reports of continued soreness in right hand when attempting to complete activities with it. Focused on coordination tasks with both hands this session. More difficulty noted with the right hand versus the left hand. Right hand demonstrates slight tremor when attempting fine motor tasks. Increased time needed to complete tasks with both hands due to decreased coordination. VC for form and technique were provided.    Body Structure / Function / Physical Skills Strength;Dexterity;Coordination;FMC    Plan P: Complete handgripper task for strengthening.    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   Malnutrition of moderate degree 12/18/2020   Middle cerebral artery embolism, 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   Encounter for intubation    Renal insufficiency    Malignant hypertension    Acute ischemic left MCA stroke ([place]) 12/16/2020   Current smoker 06/21/2019   Seizure-like activity ([place]) 05/06/2019   Diabetes mellitus ([place]) 04/11/2019   Anemia, chronic disease 07/04/2018   Other specified diseases of the digestive system 10/30/2017   CAD (coronary artery disease) 03/28/2016   Chest pain 03/21/2016   Mixed hyperlipidemia 03/11/2016   Ischemic chest pain (HCC)    NSTEMI (non-ST elevated myocardial infarction) ([place])    Coronary atherosclerosis of native coronary artery 03/03/2016   Cervical spinal stenosis 12/14/2015   Loss of weight 08/25/2015   Essential hypertension, benign 07/23/2015   Personal history of noncompliance with medical treatment, presenting hazards to health 07/23/2015   Abdominal pain 06/08/2015   Constipation 06/08/2015   History of colonic  polyps    Diverticulosis of colon without hemorrhage    Mucosal abnormality of stomach    Encounter for screening colonoscopy 04/16/2015   Dysphagia 04/16/2015   Partial symptomatic epilepsy with complex partial seizures, intractable, without status epilepticus ([place]) 11/10/2014   Bipolar disorder, unspecified ([place]) 10/15/2014   Schizophrenia ([place]) 10/15/2014   DM type 2 causing vascular disease ([place]) 06/29/2014   Musculoskeletal pain 06/29/2014   Hyperglycemia without ketosis 09/07/2013   Degeneration disease of  medial meniscus 01/13/2011   Other internal derangements of unspecified knee 01/13/2011   Knee pain 12/28/2010   Medial meniscus, posterior horn derangement 12/28/2010    Ailene Ravel, OTR/L,CBIS  [phone]  06/11/2021, 11:56 AM  [facility] [address] [place], [place], [address] Phone: [phone]   Fax:  [phone]  Name: Melvin Taylor MRN: [id number] Date of Birth: 03-01-1966

## 2021-06-14 NOTE — Telephone Encounter (Signed)
[facility], spoke with April re: depakote refill request. Per Fara Olden NP's office note 03/09/21 depakote had been reduced to 1000 mg twice daily due to tremors. April stated patient has been refilling it every 30 days for 1500 mg twice a day. I advised her I will discuss with Dr Leta Baptist and get refill sent by tomorrow. April verbalized understanding, appreciation.

## 2021-06-15 ENCOUNTER — Other Ambulatory Visit (HOSPITAL_COMMUNITY): Payer: Self-pay | Admitting: Interventional Radiology

## 2021-06-15 ENCOUNTER — Encounter (HOSPITAL_COMMUNITY): Payer: Self-pay

## 2021-06-15 ENCOUNTER — Ambulatory Visit (HOSPITAL_COMMUNITY): Payer: Medicaid Other | Admitting: Speech Pathology

## 2021-06-15 DIAGNOSIS — I6602 Occlusion and stenosis of left middle cerebral artery: Secondary | ICD-10-CM

## 2021-06-15 NOTE — Telephone Encounter (Signed)
Please refill as advised by Dr. Leta Baptist . ty

## 2021-06-16 ENCOUNTER — Encounter (HOSPITAL_COMMUNITY): Payer: Self-pay | Admitting: Occupational Therapy

## 2021-06-16 ENCOUNTER — Telehealth (HOSPITAL_COMMUNITY): Payer: Self-pay

## 2021-06-16 ENCOUNTER — Ambulatory Visit (HOSPITAL_COMMUNITY): Payer: Medicaid Other | Admitting: Occupational Therapy

## 2021-06-16 ENCOUNTER — Other Ambulatory Visit: Payer: Self-pay

## 2021-06-16 DIAGNOSIS — R29898 Other symptoms and signs involving the musculoskeletal system: Secondary | ICD-10-CM | POA: Diagnosis not present

## 2021-06-16 DIAGNOSIS — R278 Other lack of coordination: Secondary | ICD-10-CM

## 2021-06-16 NOTE — Telephone Encounter (Signed)
Called to schedule cta head/neck, no answer, left vm. AW 

## 2021-06-16 NOTE — Therapy (Signed)
[facility] [address] [place], [place], [address] Phone: [phone]   Fax:  [phone]  Occupational Therapy Treatment  Patient Details  Name: Melvin Taylor MRN: [id number] Date of Birth: [birth year] Referring Provider (OT): [name], NP   Encounter Date: 06/16/2021   OT End of Session - 06/16/21 1050     Visit Number 4    Number of Visits 8    Date for OT Re-Evaluation 06/28/21    Authorization Type [place] Medicaid    Authorization Time Period Approved 8 visits (06/04/21-07/01/21)    Authorization - Visit Number 3    Authorization - Number of Visits 8    OT Start Time [phone]    OT Stop Time 1026    OT Time Calculation (min) 38 min    Activity Tolerance Patient tolerated treatment well    Behavior During Therapy [facility] for tasks assessed/performed             Past Medical History:  Diagnosis Date   Anxiety    Bipolar 1 disorder ([place])    Bone spur    left heel   Cervical radiculopathy    Chest pain    Chronic back pain    Chronic chest wall pain    Chronic neck pain    Coronary artery disease    a. s/p CABG in 03/2016 with LIMA-LAD, SVG-D1, SVG-RCA, and Seq SVG-mid and distal OM   Depression    Diabetes mellitus    Type II   Hallucinations    "long history of them"   Headache(784.0)    History of gout    HTN (hypertension)    Insomnia    Myocardial infarction ([place]) 2017   Neuropathy    Pain management    Polysubstance abuse ([place])    Right leg pain    chronic   Schizophrenia ([place])    Seizures ()    last sz between July 5-9th, 2016; epilepsy   Shortness of breath dyspnea    with exertion   Sleep apnea    Transfusion of blood product refused for religious reason     Past Surgical History:  Procedure Laterality Date   ANTERIOR CERVICAL DECOMP/DISCECTOMY FUSION N/A 12/14/2015   Procedure: ANTERIOR CERVICAL DECOMPRESSION/DISCECTOMY FUSION CERVICAL FIVE -SIX;  Surgeon: Earnie Larsson, MD;  [facility];  Service: Neurosurgery;  Laterality: N/A;   BIOPSY  05/07/2015   Procedure: BIOPSY (Gastric);  Surgeon: [name], MD;  [facility];  Service: Endoscopy;;   CARDIAC CATHETERIZATION N/A 03/07/2016   Procedure: Left Heart Cath and Coronary Angiography;  Surgeon: Belva Crome, MD;  Location: [place] [facility];  Service: Cardiovascular;  Laterality: N/A;   COLONOSCOPY WITH PROPOFOL N/A 05/07/2015   JOA:[id number] diverticulosis, multiple colon polyps removed, tubular adenoma, serrated colon polyp. Next colonoscopy October 2019   COLONOSCOPY WITH PROPOFOL N/A 08/02/2018   Procedure: COLONOSCOPY WITH PROPOFOL;  Surgeon: [name], MD;  [facility];  Service: Endoscopy;  Laterality: N/A;  12:00pm   CORONARY ARTERY BYPASS GRAFT N/A 03/28/2016   Procedure: CORONARY ARTERY BYPASS GRAFTING (CABG) x 5 USING GREATER SAPHENOUS VEIN;  Surgeon: [name], MD;  Location: [facility];  Service: Open Heart Surgery;  Laterality: N/A;   ENDOVEIN HARVEST OF GREATER SAPHENOUS VEIN Right 03/28/2016   Procedure: ENDOVEIN HARVEST OF GREATER SAPHENOUS VEIN;  Surgeon: [name], MD;  Location: [facility];  Service: Open Heart Surgery;  Laterality: Right;  ESOPHAGEAL DILATION N/A 05/07/2015   Procedure: ESOPHAGEAL DILATION WITH 56FR MALONEY DILATOR;  Surgeon: [name], MD;  [facility];  Service: Endoscopy;  Laterality: N/A;   ESOPHAGOGASTRODUODENOSCOPY (EGD) WITH PROPOFOL N/A 05/07/2015   RMR: Status post dilation of normal esophagus. Gastritis.   IR CT HEAD LTD  12/17/2020   IR CT HEAD LTD  12/17/2020   IR INTRA CRAN STENT  12/17/2020   IR PERCUTANEOUS ART THROMBECTOMY/INFUSION INTRACRANIAL INC DIAG ANGIO  12/17/2020   IR RADIOLOGIST EVAL & MGMT  03/25/2021   KNEE SURGERY Left    arthroscopy   MANDIBLE FRACTURE SURGERY     POLYPECTOMY  05/07/2015   Procedure: POLYPECTOMY (Hepatic Flexure, Distal Transverse Colon, Rectal);  Surgeon: [name], MD;  [facility];  Service:  Endoscopy;;   RADIOLOGY WITH ANESTHESIA N/A 12/16/2020   Procedure: IR WITH ANESTHESIA;  Surgeon: Luanne Bras, MD;  Location: [facility];  Service: Radiology;  Laterality: N/A;   TEE WITHOUT CARDIOVERSION N/A 03/28/2016   Procedure: TRANSESOPHAGEAL ECHOCARDIOGRAM (TEE);  Surgeon: [name], MD;  Location: [facility];  Service: Open Heart Surgery;  Laterality: N/A;   TONSILLECTOMY      There were no vitals filed for this visit.   Subjective Assessment - 06/16/21 0947     Subjective  S: My hand hurts.    Currently in Pain? Yes    Pain Score 8     Pain Location Hand    Pain Orientation Right    Pain Descriptors / Indicators Sore    Pain Type Acute pain    Pain Radiating Towards N/A    Pain Onset 1 to 4 weeks ago    Pain Frequency Occasional    Aggravating Factors  when trying to use it or make a fist    Pain Relieving Factors when resting it    Effect of Pain on Daily Activities mod/max effect on ADLs    Multiple Pain Sites No                [facility] OT Assessment - 06/16/21 0946       Assessment   Medical Diagnosis Left MCA CVA      Precautions   Precautions None                      OT Treatments/Exercises (OP) - 06/16/21 0950       Exercises   Exercises Hand      Hand Exercises   Digit Composite ABduction AROM;10 reps    Digit Composite ADduction AROM;5 reps    Opposition AROM;5 reps   to all fingers   Hand Gripper with Large Beads R: all beads gripper at 20# horizontal; L: all beads gripper at 25# vertical    Hand Gripper with Medium Beads R: all beads gripper at 20# vertical L: all beads gripper at 25# vertical    Sponges right: 15    Other Hand Exercises Right hand: pt using red clothespin and 3 point pinch to grasp and place 20 sponges in bucket    Other Hand Exercises composite digit flexion/extension 10X                      OT Short Term Goals - 06/04/21 1055       OT SHORT TERM GOAL #1   Title Pt will be educated and  verbalize understanding of HEP in order to increase his bilateral hand coordination and strength in order  to participate in every day self care and leisure tasks with less difficulty.    Time 4    Period Weeks    Status On-going    Target Date 06/28/21      OT SHORT TERM GOAL #2   Title Pt will increase his bilateral hand coordination by decreasing the time it task to complete the 9 hole peg test to the following: right hand: 10" faster; left hand: 8" faster.    Time 4    Period Weeks    Status On-going      OT SHORT TERM GOAL #3   Title Pt will increase his bilateral hand grip strength to 50# or better where applicable in order to maintain a functional grasp on items without dropping them.    Time 4    Period Weeks    Status On-going      OT SHORT TERM GOAL #4   Title Pt will increase his bilateral hand pinch strength to average norms in order to manipulate small items successfully and without difficulty due to increased strength and dexterity.    Time 4    Period Weeks    Status On-going                      Plan - 06/16/21 1018     Clinical Impression Statement A: Pt reports continued right hand soreness with use. Discussed importance of using and moving the hand to improve mobility and decrease stiffness. Added hand gripper strengthening activity today, mod cuing for correct completion. Also added pinch task with right hand, no difficulty with opening clothespin all the way to grasp sponge. Verbal cuing for form and technique throughout tasks.    Body Structure / Function / Physical Skills Strength;Dexterity;Coordination;FMC    Plan P: Continue with grip and pinch strengthening-prioritize right hand    OT Home Exercise Plan eval: putty    Consulted and Agree with Plan of Care Patient             Patient will benefit from skilled therapeutic intervention in order to improve the following deficits and impairments:   Body Structure / Function / Physical Skills:  Strength, Dexterity, Coordination, [facility]       Visit Diagnosis: Other lack of coordination  Other symptoms and signs involving the musculoskeletal system    Problem List Patient Active Problem List   Diagnosis Date Noted   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   Malnutrition of moderate degree 12/18/2020   Middle cerebral artery embolism, 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   Encounter for intubation    Renal insufficiency    Malignant hypertension    Acute ischemic left MCA stroke ([place]) 12/16/2020   Current smoker 06/21/2019   Seizure-like activity ([place]) 05/06/2019   Diabetes mellitus ([place]) 04/11/2019   Anemia, chronic disease 07/04/2018   Other specified diseases of the digestive system 10/30/2017   CAD (coronary artery disease) 03/28/2016   Chest pain 03/21/2016   Mixed hyperlipidemia 03/11/2016   Ischemic chest pain (HCC)    NSTEMI (non-ST elevated myocardial infarction) ([place])    Coronary atherosclerosis of native coronary artery 03/03/2016   Cervical spinal stenosis 12/14/2015   Loss of weight 08/25/2015   Essential hypertension, benign 07/23/2015   Personal history of noncompliance with medical treatment, presenting hazards to health 07/23/2015   Abdominal pain 06/08/2015   Constipation 06/08/2015   History of colonic polyps    Diverticulosis of colon without hemorrhage    Mucosal abnormality of  stomach    Encounter for screening colonoscopy 04/16/2015   Dysphagia 04/16/2015   Partial symptomatic epilepsy with complex partial seizures, intractable, without status epilepticus ([place]) 11/10/2014   Bipolar disorder, unspecified ([place]) 10/15/2014   Schizophrenia ([place]) 10/15/2014   DM type 2 causing vascular disease ([place]) 06/29/2014   Musculoskeletal pain 06/29/2014   Hyperglycemia without ketosis 09/07/2013   Degeneration disease of medial meniscus 01/13/2011   Other internal derangements of unspecified knee 01/13/2011   Knee pain 12/28/2010   Medial meniscus, posterior  horn derangement 12/28/2010    Guadelupe Sabin, OTR/L  [phone] 06/16/2021, 10:51 AM  [facility] [address] [place], [place], [address] Phone: [phone]   Fax:  [phone]  Name: Melvin Taylor MRN: [id number] Date of Birth: Jul 14, 1966

## 2021-06-17 ENCOUNTER — Encounter (HOSPITAL_COMMUNITY): Payer: Self-pay | Admitting: Speech Pathology

## 2021-06-17 ENCOUNTER — Ambulatory Visit (HOSPITAL_COMMUNITY): Payer: Medicaid Other

## 2021-06-17 ENCOUNTER — Ambulatory Visit (HOSPITAL_COMMUNITY): Payer: Medicaid Other | Admitting: Speech Pathology

## 2021-06-17 ENCOUNTER — Encounter (HOSPITAL_COMMUNITY): Payer: Self-pay

## 2021-06-17 DIAGNOSIS — I6981 Attention and concentration deficit following other cerebrovascular disease: Secondary | ICD-10-CM

## 2021-06-17 DIAGNOSIS — R278 Other lack of coordination: Secondary | ICD-10-CM

## 2021-06-17 DIAGNOSIS — R29898 Other symptoms and signs involving the musculoskeletal system: Secondary | ICD-10-CM

## 2021-06-17 DIAGNOSIS — R41841 Cognitive communication deficit: Secondary | ICD-10-CM

## 2021-06-17 NOTE — Therapy (Signed)
[facility] [address] [place], [place], [address] Phone: [phone]   Fax:  [phone]  Speech Language Pathology Treatment  Patient Details  Name: Melvin Taylor MRN: [id number] Date of Birth: 1966/07/17 Referring Provider (SLP): [name], NP   Encounter Date: 06/17/2021   End of Session - 06/17/21 1030     Visit Number 2    Number of Visits 7    Authorization Type Medicaid [place] Access    Authorization Time Period 06/10/21-07/07/21    SLP Start Time 0955    SLP Stop Time  0258    SLP Time Calculation (min) 40 min    Activity Tolerance Patient tolerated treatment well             Past Medical History:  Diagnosis Date   Anxiety    Bipolar 1 disorder ([place])    Bone spur    left heel   Cervical radiculopathy    Chest pain    Chronic back pain    Chronic chest wall pain    Chronic neck pain    Coronary artery disease    a. s/p CABG in 03/2016 with LIMA-LAD, SVG-D1, SVG-RCA, and Seq SVG-mid and distal OM   Depression    Diabetes mellitus    Type II   Hallucinations    "long history of them"   Headache(784.0)    History of gout    HTN (hypertension)    Insomnia    Myocardial infarction ([place]) 2017   Neuropathy    Pain management    Polysubstance abuse ([place])    Right leg pain    chronic   Schizophrenia ([place])    Seizures ([place])    last sz between July 5-9th, 2016; epilepsy   Shortness of breath dyspnea    with exertion   Sleep apnea    Transfusion of blood product refused for religious reason     Past Surgical History:  Procedure Laterality Date   ANTERIOR CERVICAL DECOMP/DISCECTOMY FUSION N/A 12/14/2015   Procedure: ANTERIOR CERVICAL DECOMPRESSION/DISCECTOMY FUSION CERVICAL FIVE -SIX;  Surgeon: Earnie Larsson, MD;  [facility];  Service: Neurosurgery;  Laterality: N/A;   BIOPSY  05/07/2015   Procedure: BIOPSY (Gastric);  Surgeon: [name], MD;  Location: [facility];  Service: Endoscopy;;    CARDIAC CATHETERIZATION N/A 03/07/2016   Procedure: Left Heart Cath and Coronary Angiography;  Surgeon: Belva Crome, MD;  Location: [facility];  Service: Cardiovascular;  Laterality: N/A;   COLONOSCOPY WITH PROPOFOL N/A 05/07/2015   NID:POEUMPNTIR diverticulosis, multiple colon polyps removed, tubular adenoma, serrated colon polyp. Next colonoscopy October 2019   COLONOSCOPY WITH PROPOFOL N/A 08/02/2018   Procedure: COLONOSCOPY WITH PROPOFOL;  Surgeon: [name], MD;  [facility];  Service: Endoscopy;  Laterality: N/A;  12:00pm   CORONARY ARTERY BYPASS GRAFT N/A 03/28/2016   Procedure: CORONARY ARTERY BYPASS GRAFTING (CABG) x 5 USING GREATER SAPHENOUS VEIN;  Surgeon: [name], MD;  Location: [facility];  Service: Open Heart Surgery;  Laterality: N/A;   ENDOVEIN HARVEST OF GREATER SAPHENOUS VEIN Right 03/28/2016   Procedure: ENDOVEIN HARVEST OF GREATER SAPHENOUS VEIN;  Surgeon: [name], MD;  Location: [facility];  Service: Open Heart Surgery;  Laterality: Right;   ESOPHAGEAL DILATION N/A 05/07/2015   Procedure: ESOPHAGEAL DILATION WITH 56FR MALONEY DILATOR;  Surgeon: [name], MD;  [facility];  Service: Endoscopy;  Laterality: N/A;   ESOPHAGOGASTRODUODENOSCOPY (EGD) WITH PROPOFOL  N/A 05/07/2015   RMR: Status post dilation of normal esophagus. Gastritis.   IR CT HEAD LTD  12/17/2020   IR CT HEAD LTD  12/17/2020   IR INTRA CRAN STENT  12/17/2020   IR PERCUTANEOUS ART THROMBECTOMY/INFUSION INTRACRANIAL INC DIAG ANGIO  12/17/2020   IR RADIOLOGIST EVAL & MGMT  03/25/2021   KNEE SURGERY Left    arthroscopy   MANDIBLE FRACTURE SURGERY     POLYPECTOMY  05/07/2015   Procedure: POLYPECTOMY (Hepatic Flexure, Distal Transverse Colon, Rectal);  Surgeon: [name], MD;  [facility];  Service: Endoscopy;;   RADIOLOGY WITH ANESTHESIA N/A 12/16/2020   Procedure: IR WITH ANESTHESIA;  Surgeon: Luanne Bras, MD;  Location: [facility];  Service: Radiology;  Laterality:  N/A;   TEE WITHOUT CARDIOVERSION N/A 03/28/2016   Procedure: TRANSESOPHAGEAL ECHOCARDIOGRAM (TEE);  Surgeon: [name], MD;  Location: [facility];  Service: Open Heart Surgery;  Laterality: N/A;   TONSILLECTOMY      There were no vitals filed for this visit.   Subjective Assessment - 06/17/21 1005     Subjective "I didn't know I missed an appointment."    Currently in Pain? 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 Patient spent 2 weeks in CIR at [facility]. No [facility] therapy was received. He reports that he has a home health aid every day assisting with meal prep and housekeeping tasks 9am-4pm. [name], NP has referred patient to speech pathology for evaluation and treatment.      Treatment Provided   Treatment provided Cognitive-Linquistic      Pain Assessment   Pain Assessment No/denies pain      Cognitive-Linquistic Treatment   Treatment focused on Cognition;Patient/family/caregiver education    Skilled Treatment SLP provided verbal and written instructions regarding memory strategies and incorporation of strategies in recall tasks.      Assessment / Recommendations / Plan   Plan Continue with current plan of care      Progression Toward Goals   Progression toward goals Progressing toward goals              SLP Education - 06/17/21 1029     Education Details Provided folder with memory strategies and copy of schdule    Person(s) Educated Patient    Methods Explanation;Handout    Comprehension Verbalized understanding              SLP Short Term Goals - 06/17/21 1338       SLP SHORT TERM GOAL #1   Title Pt will record 3 or greater weekly appointments, reminders, to-do items in external memory devices to facilitate task  completion with min cues from caregivers/SLP.    Baseline Family is completing this for him    Time 4    Period Weeks    Status On-going    Target Date 07/07/21      SLP SHORT TERM GOAL #2   Title Pt will increase recall for requested information to 90% acc during functional memory exercises with use of compensatory strategies as needed when provided min cues.    Baseline 66%    Time 4    Period  Weeks    Status On-going    Target Date 07/07/21      SLP SHORT TERM GOAL #3   Title Pt will complete moderately complex visual scanning activities during pencil/paper tasks with 90% acc with min assist and use of strategies as needed.    Baseline 70% clock drawing    Time 4    Period Weeks    Status On-going    Target Date 07/07/21      SLP SHORT TERM GOAL #4   Title Pt will complete moderate level reasoning and problem solving tasks with 80% acc with min cues provided from SLP.    Baseline 60% acc    Time 4    Period Weeks    Status On-going    Target Date 07/07/21      SLP SHORT TERM GOAL #5   Title Pt will increase recall of short paragraphs to 80% acc via responding to short questions with mi/mod assist for use of recall strategies from SLP.    Baseline 20%    Time 4    Period Weeks    Status On-going    Target Date 07/07/21              SLP Long Term Goals - 06/17/21 1339       SLP LONG TERM GOAL #1   Title Same as short              Plan - 06/17/21 1334     Clinical Impression Statement Pt missed his first two appointments and he stated that he didn't know he had appointments scheduled. SLP printed two copies of his schedule and asked him to keep one in his folder and one on his fridge at home. SLP provided written memory strategies and reviewed with Pt. Pt asked to recall 4 words after SLP provided mod/max cues for implementation of memory strategies. Pt able to recall 4/4 words after 10 minute delay with use of association strategies. Continue plan of care.     Speech Therapy Frequency 2x / week    Duration --   3-4 weeks   Treatment/Interventions Cognitive reorganization;SLP instruction and feedback;Internal/external aids;Compensatory strategies;Patient/family education;Cueing hierarchy;Language facilitation    Potential to Achieve Goals Fair    Potential Considerations Severity of impairments;Previous level of function    SLP Home Exercise Plan Pt will completed HEP as assigned to facilitate carryover of treatment strategies and techniques in home environment with use of written cues as needed.    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   Malnutrition of moderate degree 12/18/2020   Middle cerebral artery embolism, 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   Encounter for intubation    Renal insufficiency    Malignant hypertension    Acute ischemic left MCA stroke ([place]) 12/16/2020   Current smoker 06/21/2019   Seizure-like activity ([place]) 05/06/2019   Diabetes mellitus ([place]) 04/11/2019   Anemia, chronic disease 07/04/2018   Other specified diseases of the digestive system 10/30/2017   CAD (coronary artery disease) 03/28/2016   Chest pain 03/21/2016   Mixed hyperlipidemia 03/11/2016   Ischemic chest pain ([place])  NSTEMI (non-ST elevated myocardial infarction) ([place])    Coronary atherosclerosis of native coronary artery 03/03/2016   Cervical spinal stenosis 12/14/2015   Loss of weight 08/25/2015   Essential hypertension, benign 07/23/2015   Personal history of noncompliance with medical treatment, presenting hazards to health 07/23/2015   Abdominal pain 06/08/2015   Constipation 06/08/2015    History of colonic polyps    Diverticulosis of colon without hemorrhage    Mucosal abnormality of stomach    Encounter for screening colonoscopy 04/16/2015   Dysphagia 04/16/2015   Partial symptomatic epilepsy with complex partial seizures, intractable, without status epilepticus ([place]) 11/10/2014   Bipolar disorder, unspecified ([place]) 10/15/2014   Schizophrenia ([place]) 10/15/2014   DM type 2 causing vascular disease ([place]) 06/29/2014   Musculoskeletal pain 06/29/2014   Hyperglycemia without ketosis 09/07/2013   Degeneration disease of medial meniscus 01/13/2011   Other internal derangements of unspecified knee 01/13/2011   Knee pain 12/28/2010   Medial meniscus, posterior horn derangement 12/28/2010   Thank you,  Genene Churn, CCC-SLP [phone]  Vadie Principato 06/17/2021, 1:40 PM  [facility] [address] [place], [place], [address] Phone: [phone]   Fax:  [phone]   Name: Melvin Taylor MRN: [id number] Date of Birth: 12-Dec-1965

## 2021-06-17 NOTE — Therapy (Signed)
[facility] [place], [place], [address] Phone: [phone]   Fax:  [phone]  Occupational Therapy Treatment  Patient Details  Name: Melvin Taylor MRN: [id number] Date of Birth: 10-27-1965 Referring Provider (OT): [name], NP   Encounter Date: 06/17/2021   OT End of Session - 06/17/21 1114     Visit Number 5    Number of Visits 8    Date for OT Re-Evaluation 06/28/21    Authorization Type  Medicaid    Authorization Time Period Approved 8 visits (06/04/21-07/01/21)    Authorization - Visit Number 4    Authorization - Number of Visits 8    OT Start Time 1033   Patient requested to end session early.   OT Stop Time 1104    OT Time Calculation (min) 31 min    Activity Tolerance Patient tolerated treatment well    Behavior During Therapy WFL for tasks assessed/performed             Past Medical History:  Diagnosis Date   Anxiety    Bipolar 1 disorder ([place])    Bone spur    left heel   Cervical radiculopathy    Chest pain    Chronic back pain    Chronic chest wall pain    Chronic neck pain    Coronary artery disease    a. s/p CABG in 03/2016 with LIMA-LAD, SVG-D1, SVG-RCA, and Seq SVG-mid and distal OM   Depression    Diabetes mellitus    Type II   Hallucinations    "long history of them"   Headache(784.0)    History of gout    HTN (hypertension)    Insomnia    Myocardial infarction ([place]) 2017   Neuropathy    Pain management    Polysubstance abuse ([place])    Right leg pain    chronic   Schizophrenia ([place])    Seizures ([place])    last sz between July 5-9th, 2016; epilepsy   Shortness of breath dyspnea    with exertion   Sleep apnea    Transfusion of blood product refused for religious reason     Past Surgical History:  Procedure Laterality Date   ANTERIOR CERVICAL DECOMP/DISCECTOMY FUSION N/A 12/14/2015   Procedure: ANTERIOR CERVICAL DECOMPRESSION/DISCECTOMY FUSION CERVICAL FIVE -SIX;   Surgeon: Earnie Larsson, MD;  [facility];  Service: Neurosurgery;  Laterality: N/A;   BIOPSY  05/07/2015   Procedure: BIOPSY (Gastric);  Surgeon: [name], MD;  [facility];  Service: Endoscopy;;   CARDIAC CATHETERIZATION N/A 03/07/2016   Procedure: Left Heart Cath and Coronary Angiography;  Surgeon: Belva Crome, MD;  Location: [facility];  Service: Cardiovascular;  Laterality: N/A;   COLONOSCOPY WITH PROPOFOL N/A 05/07/2015   KDT:[id number] diverticulosis, multiple colon polyps removed, tubular adenoma, serrated colon polyp. Next colonoscopy October 2019   COLONOSCOPY WITH PROPOFOL N/A 08/02/2018   Procedure: COLONOSCOPY WITH PROPOFOL;  Surgeon: [name], MD;  [facility];  Service: Endoscopy;  Laterality: N/A;  12:00pm   CORONARY ARTERY BYPASS GRAFT N/A 03/28/2016   Procedure: CORONARY ARTERY BYPASS GRAFTING (CABG) x 5 USING GREATER SAPHENOUS VEIN;  Surgeon: [name], MD;  Location: [facility];  Service: Open Heart Surgery;  Laterality: N/A;   ENDOVEIN HARVEST OF GREATER SAPHENOUS VEIN Right 03/28/2016   Procedure: ENDOVEIN HARVEST OF GREATER SAPHENOUS VEIN;  Surgeon: [name], MD;  Location: [facility];  Service:  Open Heart Surgery;  Laterality: Right;   ESOPHAGEAL DILATION N/A 05/07/2015   Procedure: ESOPHAGEAL DILATION WITH 56FR MALONEY DILATOR;  Surgeon: [name], MD;  [facility];  Service: Endoscopy;  Laterality: N/A;   ESOPHAGOGASTRODUODENOSCOPY (EGD) WITH PROPOFOL N/A 05/07/2015   RMR: Status post dilation of normal esophagus. Gastritis.   IR CT HEAD LTD  12/17/2020   IR CT HEAD LTD  12/17/2020   IR INTRA CRAN STENT  12/17/2020   IR PERCUTANEOUS ART THROMBECTOMY/INFUSION INTRACRANIAL INC DIAG ANGIO  12/17/2020   IR RADIOLOGIST EVAL & MGMT  03/25/2021   KNEE SURGERY Left    arthroscopy   MANDIBLE FRACTURE SURGERY     POLYPECTOMY  05/07/2015   Procedure: POLYPECTOMY (Hepatic Flexure, Distal Transverse Colon, Rectal);  Surgeon: [name], MD;  [facility];  Service: Endoscopy;;   RADIOLOGY WITH ANESTHESIA N/A 12/16/2020   Procedure: IR WITH ANESTHESIA;  Surgeon: Luanne Bras, MD;  Location: [facility];  Service: Radiology;  Laterality: N/A;   TEE WITHOUT CARDIOVERSION N/A 03/28/2016   Procedure: TRANSESOPHAGEAL ECHOCARDIOGRAM (TEE);  Surgeon: [name], MD;  Location: [facility];  Service: Open Heart Surgery;  Laterality: N/A;   TONSILLECTOMY      There were no vitals filed for this visit.   Subjective Assessment - 06/17/21 1036     Currently in Pain? Yes    Pain Score 7                 [facility] OT Assessment - 06/17/21 1041       Assessment   Medical Diagnosis Left MCA CVA      Precautions   Precautions None                      OT Treatments/Exercises (OP) - 06/17/21 1037       Exercises   Exercises Hand;Theraputty      Hand Exercises   Hand Gripper with Small Beads L: all beads with gripper set at 25# R; all beads with gripper set at 15#   horizontal for both   Other Hand Exercises Using pvc pipe, patient pressed circles into flattened yellow putty. Completed half with right hand and half with left hand.      Theraputty   Theraputty - Flatten yellow - both hands                      OT Short Term Goals - 06/04/21 1055       OT SHORT TERM GOAL #1   Title Pt will be educated and verbalize understanding of HEP in order to increase his bilateral hand coordination and strength in order to participate in every day self care and leisure tasks with less difficulty.    Time 4    Period Weeks    Status On-going    Target Date 06/28/21      OT SHORT TERM GOAL #2   Title Pt will increase his bilateral hand coordination by decreasing the time it task to complete the 9 hole peg test to the following: right hand: 10" faster; left hand: 8" faster.    Time 4    Period Weeks    Status On-going      OT SHORT TERM GOAL #3   Title Pt will increase his bilateral hand grip  strength to 50# or better where applicable in order to maintain a functional grasp on items without dropping them.    Time  4    Period Weeks    Status On-going      OT SHORT TERM GOAL #4   Title Pt will increase his bilateral hand pinch strength to average norms in order to manipulate small items successfully and without difficulty due to increased strength and dexterity.    Time 4    Period Weeks    Status On-going                      Plan - 06/17/21 1115     Clinical Impression Statement A: Completed hand strengthening with both hands this session. Increased time needed to complete all activities due to hand fatigue. Pt continues to report increased soreness in the right hand with all activities. VC for form and technique was provided during all tasks.    Body Structure / Function / Physical Skills Strength;Dexterity;Coordination;FMC    Plan P; Continue with grip and pinch strengthening. Coordination task with tweezers or deck of cards.    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   Malnutrition of moderate degree 12/18/2020   Middle cerebral artery embolism, 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   Encounter for intubation    Renal insufficiency    Malignant hypertension    Acute ischemic left MCA stroke ([place]) 12/16/2020   Current smoker 06/21/2019   Seizure-like activity ([place]) 05/06/2019   Diabetes mellitus ([place]) 04/11/2019   Anemia, chronic disease 07/04/2018   Other specified diseases of the digestive system 10/30/2017    CAD (coronary artery disease) 03/28/2016   Chest pain 03/21/2016   Mixed hyperlipidemia 03/11/2016   Ischemic chest pain ([place])    NSTEMI (non-ST elevated myocardial infarction) ([place])    Coronary atherosclerosis of native coronary artery 03/03/2016   Cervical spinal stenosis 12/14/2015   Loss of weight 08/25/2015   Essential hypertension, benign 07/23/2015   Personal history of noncompliance with medical treatment, presenting hazards to health 07/23/2015   Abdominal pain 06/08/2015   Constipation 06/08/2015   History of colonic polyps    Diverticulosis of colon without hemorrhage    Mucosal abnormality of stomach    Encounter for screening colonoscopy 04/16/2015   Dysphagia 04/16/2015   Partial symptomatic epilepsy with complex partial seizures, intractable, without status epilepticus ([place]) 11/10/2014   Bipolar disorder, unspecified ([place]) 10/15/2014   Schizophrenia ([place]) 10/15/2014   DM type 2 causing vascular disease ([place]) 06/29/2014   Musculoskeletal pain 06/29/2014   Hyperglycemia without ketosis 09/07/2013   Degeneration disease of medial meniscus 01/13/2011   Other internal derangements of unspecified knee 01/13/2011   Knee pain 12/28/2010   Medial meniscus, posterior horn derangement 12/28/2010   Ailene Ravel, OTR/L,CBIS  [phone]  06/17/2021, 11:38 AM  [facility] [address] [place], [place], [address] Phone: [phone]   Fax:  [phone]  Name: Melvin Taylor MRN: [id number] Date of Birth: June 01, 1966

## 2021-06-18 ENCOUNTER — Encounter (HOSPITAL_COMMUNITY): Payer: Medicaid Other | Admitting: Occupational Therapy

## 2021-06-21 ENCOUNTER — Ambulatory Visit (HOSPITAL_COMMUNITY): Payer: Medicaid Other | Admitting: Speech Pathology

## 2021-06-21 ENCOUNTER — Ambulatory Visit (HOSPITAL_COMMUNITY): Payer: Medicaid Other | Admitting: Occupational Therapy

## 2021-06-22 ENCOUNTER — Ambulatory Visit (HOSPITAL_COMMUNITY): Payer: Medicaid Other | Admitting: Speech Pathology

## 2021-06-22 ENCOUNTER — Encounter (HOSPITAL_COMMUNITY): Payer: Self-pay

## 2021-06-22 ENCOUNTER — Ambulatory Visit (HOSPITAL_COMMUNITY): Payer: Medicaid Other

## 2021-06-22 ENCOUNTER — Other Ambulatory Visit: Payer: Self-pay

## 2021-06-22 DIAGNOSIS — R29898 Other symptoms and signs involving the musculoskeletal system: Secondary | ICD-10-CM | POA: Diagnosis not present

## 2021-06-22 DIAGNOSIS — R278 Other lack of coordination: Secondary | ICD-10-CM

## 2021-06-22 NOTE — Therapy (Signed)
[facility] [address] [place], [place], [address] Phone: [phone]   Fax:  [phone]  Occupational Therapy Treatment reassessment Patient Details  Name: Melvin Taylor MRN: [id number] Date of Birth: [birth year] Referring Provider (OT): [name], NP   Encounter Date: 06/22/2021   OT End of Session - 06/22/21 1214     Visit Number 6    Number of Visits 8    Authorization Type [place] Medicaid    Authorization Time Period Approved 8 visits (06/04/21-07/01/21)    Authorization - Visit Number 5    Authorization - Number of Visits 8    OT Start Time 1115   reassess and discharge   OT Stop Time 1145    OT Time Calculation (min) 30 min    Activity Tolerance Patient tolerated treatment well    Behavior During Therapy [facility] for tasks assessed/performed             Past Medical History:  Diagnosis Date   Anxiety    Bipolar 1 disorder ([place])    Bone spur    left heel   Cervical radiculopathy    Chest pain    Chronic back pain    Chronic chest wall pain    Chronic neck pain    Coronary artery disease    a. s/p CABG in 03/2016 with LIMA-LAD, SVG-D1, SVG-RCA, and Seq SVG-mid and distal OM   Depression    Diabetes mellitus    Type II   Hallucinations    "long history of them"   Headache(784.0)    History of gout    HTN (hypertension)    Insomnia    Myocardial infarction ([place]) 2017   Neuropathy    Pain management    Polysubstance abuse ([place])    Right leg pain    chronic   Schizophrenia ([place])    Seizures ([place])    last sz between July 5-9th, 2016; epilepsy   Shortness of breath dyspnea    with exertion   Sleep apnea    Transfusion of blood product refused for religious reason     Past Surgical History:  Procedure Laterality Date   ANTERIOR CERVICAL DECOMP/DISCECTOMY FUSION N/A 12/14/2015   Procedure: ANTERIOR CERVICAL DECOMPRESSION/DISCECTOMY FUSION CERVICAL FIVE -SIX;  Surgeon: Earnie Larsson, MD;  [facility];  Service: Neurosurgery;  Laterality: N/A;   BIOPSY  05/07/2015   Procedure: BIOPSY (Gastric);  Surgeon: [name], MD;  Location: [facility];  Service: Endoscopy;;   CARDIAC CATHETERIZATION N/A 03/07/2016   Procedure: Left Heart Cath and Coronary Angiography;  Surgeon: Belva Crome, MD;  Location: [place] [facility] CV LAB;  Service: Cardiovascular;  Laterality: N/A;   COLONOSCOPY WITH PROPOFOL N/A 05/07/2015   [id number] diverticulosis, multiple colon polyps removed, tubular adenoma, serrated colon polyp. Next colonoscopy October 2019   COLONOSCOPY WITH PROPOFOL N/A 08/02/2018   Procedure: COLONOSCOPY WITH PROPOFOL;  Surgeon: [name], MD;  [facility];  Service: Endoscopy;  Laterality: N/A;  12:00pm   CORONARY ARTERY BYPASS GRAFT N/A 03/28/2016   Procedure: CORONARY ARTERY BYPASS GRAFTING (CABG) x 5 USING GREATER SAPHENOUS VEIN;  Surgeon: [name], MD;  Location: [facility] OR;  Service: Open Heart Surgery;  Laterality: N/A;   ENDOVEIN HARVEST OF GREATER SAPHENOUS VEIN Right 03/28/2016   Procedure: ENDOVEIN HARVEST OF GREATER SAPHENOUS VEIN;  Surgeon: [name], MD;  Location: [facility];  Service: Open Heart Surgery;  Laterality: Right;   ESOPHAGEAL DILATION N/A  05/07/2015   Procedure: ESOPHAGEAL DILATION WITH 56FR MALONEY DILATOR;  Surgeon: [name], MD;  Location: [facility];  Service: Endoscopy;  Laterality: N/A;   ESOPHAGOGASTRODUODENOSCOPY (EGD) WITH PROPOFOL N/A 05/07/2015   RMR: Status post dilation of normal esophagus. Gastritis.   IR CT HEAD LTD  12/17/2020   IR CT HEAD LTD  12/17/2020   IR INTRA CRAN STENT  12/17/2020   IR PERCUTANEOUS ART THROMBECTOMY/INFUSION INTRACRANIAL INC DIAG ANGIO  12/17/2020   IR RADIOLOGIST EVAL & MGMT  03/25/2021   KNEE SURGERY Left    arthroscopy   MANDIBLE FRACTURE SURGERY     POLYPECTOMY  05/07/2015   Procedure: POLYPECTOMY (Hepatic Flexure, Distal Transverse Colon, Rectal);  Surgeon: [name], MD;  [facility];  Service:  Endoscopy;;   RADIOLOGY WITH ANESTHESIA N/A 12/16/2020   Procedure: IR WITH ANESTHESIA;  Surgeon: Luanne Bras, MD;  Location: [facility];  Service: Radiology;  Laterality: N/A;   TEE WITHOUT CARDIOVERSION N/A 03/28/2016   Procedure: TRANSESOPHAGEAL ECHOCARDIOGRAM (TEE);  Surgeon: [name], MD;  Location: [facility];  Service: Open Heart Surgery;  Laterality: N/A;   TONSILLECTOMY      There were no vitals filed for this visit.   Subjective Assessment - 06/22/21 1211     Subjective  S: One of my medications is making me sleepy. When I take it in the morning, it's not too long before I have to go right back to bed.    Currently in Pain? Other (Comment)   reports pain in his right wrist (anterior portion) when completing any type of gripping activity. 4/10               [facility] OT Assessment - 06/22/21 1124       Assessment   Medical Diagnosis Left MCA CVA      Precautions   Precautions None      Coordination   9 Hole Peg Test Left;Right    Right 9 Hole Peg Test 53.1"   previous: 43.1"   Left 9 Hole Peg Test 38.8"   previous: 34.2"     ROM / Strength   AROM / PROM / Strength Strength      Strength   Right/Left hand Right;Left    Right Hand Grip (lbs) 15   previous: 25   Right Hand Lateral Pinch 10 lbs   previous: same   Right Hand 3 Point Pinch 10 lbs   previous: 8   Left Hand Grip (lbs) 37   previous: 40   Left Hand Lateral Pinch 16 lbs   previous: same   Left Hand 3 Point Pinch 12 lbs   previous: 10                               OT Short Term Goals - 06/22/21 1136       OT SHORT TERM GOAL #1   Title Pt will be educated and verbalize understanding of HEP in order to increase his bilateral hand coordination and strength in order to participate in every day self care and leisure tasks with less difficulty.    Time 4    Period Weeks    Status Partially Met    Target Date 06/28/21      OT SHORT TERM GOAL #2   Title Pt will increase his  bilateral hand coordination by decreasing the time it task to complete the 9 hole peg test to  the following: right hand: 10" faster; left hand: 8" faster.    Time 4    Period Weeks    Status Not Met      OT SHORT TERM GOAL #3   Title Pt will increase his bilateral hand grip strength to 50# or better where applicable in order to maintain a functional grasp on items without dropping them.    Time 4    Period Weeks    Status Not Met      OT SHORT TERM GOAL #4   Title Pt will increase his bilateral hand pinch strength to average norms in order to manipulate small items successfully and without difficulty due to increased strength and dexterity.    Time 4    Period Weeks    Status Not Met                      Plan - 06/22/21 1215     Clinical Impression Statement A: reassessment completed this date. Patient has partially met 1 goal out of 4. bilateral hand lateral pinch has increased by 2lbs. Bilateral 3 point pinch has remained the same. Bilateral grip strength has decreased as well as 9 hole peg test time for both hands. Pt reports that he feels his medication is making him sleepy. He does not know if it's his medication for seizures or for his BP that is causing this drowsiness. Pt reports that shortly after he takes his medication in the AM, he needs to go back to sleep. Patient also reports daily HEP completion frequency greater than what is recommended. Frequency on HEP handout states 1-2 times a day for completion. patient reports completing HEP 5 times a day. Per therapy notes, patient was educated on frequency at evaulation, again on 06/04/21, and also today. Pt reports consistantly experiencing right hand/wrist pain when completing gripping activities. Pain has remained since initial evalation.    Body Structure / Function / Physical Skills Strength;Dexterity;Coordination;FMC    Plan P: D/C from OT services to follow up with referring provider (Neurologist) to address possible  cause of sleepiness (medication?) and to discuss right hand/wrist pain. 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   Malnutrition of moderate degree 12/18/2020   Middle cerebral artery embolism, 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   Encounter for intubation    Renal insufficiency    Malignant hypertension    Acute ischemic left MCA stroke ([place]) 12/16/2020   Current smoker 06/21/2019   Seizure-like activity ([place]) 05/06/2019   Diabetes mellitus ([place]) 04/11/2019   Anemia, chronic disease 07/04/2018   Other specified diseases of the digestive system 10/30/2017   CAD (coronary artery disease) 03/28/2016   Chest pain 03/21/2016   Mixed hyperlipidemia 03/11/2016   Ischemic chest pain (H[facility])    NSTEMI (non-ST elevated myocardial infarction) ([place])    Coronary atherosclerosis of native coronary artery 03/03/2016   Cervical spinal stenosis 12/14/2015   Loss of weight 08/25/2015   Essential hypertension, benign  07/23/2015   Personal history of noncompliance with medical treatment, presenting hazards to health 07/23/2015   Abdominal pain 06/08/2015   Constipation 06/08/2015   History of colonic polyps    Diverticulosis of colon without hemorrhage    Mucosal abnormality of stomach    Encounter for screening colonoscopy 04/16/2015   Dysphagia 04/16/2015   Partial symptomatic epilepsy with complex partial seizures, intractable, without status epilepticus ([place]) 11/10/2014   Bipolar disorder, unspecified ([place]) 10/15/2014    Schizophrenia ([place]) 10/15/2014   DM type 2 causing vascular disease ([place]) 06/29/2014   Musculoskeletal pain 06/29/2014   Hyperglycemia without ketosis 09/07/2013   Degeneration disease of medial meniscus 01/13/2011   Other internal derangements of unspecified knee 01/13/2011   Knee pain 12/28/2010   Medial meniscus, posterior horn derangement 12/28/2010    Ailene Ravel, OTR/L,CBIS  [phone]  06/22/2021, 12:48 PM  [facility] [address] [place], [place], [address] Phone: [phone]   Fax:  [phone]  Name: Melvin Taylor MRN: [id number] Date of Birth: [birth year]  OCCUPATIONAL THERAPY DISCHARGE SUMMARY  Visits from Start of Care: 6  Current functional level related to goals / functional outcomes: See above   Remaining deficits: Right hand pain, bilateral hand weakness, decreased coordination   Education / Equipment: Hand strength HEP with putty. Frequent reminders and education provided regarding HEP frequency.   Patient agrees to discharge. Patient goals were  1 partially met out of 4 . Patient is being discharged due to lack of progress.Marland Kitchen

## 2021-06-28 ENCOUNTER — Ambulatory Visit (HOSPITAL_COMMUNITY): Payer: Medicaid Other | Admitting: Speech Pathology

## 2021-06-28 ENCOUNTER — Other Ambulatory Visit: Payer: Self-pay

## 2021-06-28 ENCOUNTER — Encounter (HOSPITAL_COMMUNITY): Payer: Self-pay | Admitting: Speech Pathology

## 2021-06-28 DIAGNOSIS — R41841 Cognitive communication deficit: Secondary | ICD-10-CM

## 2021-06-28 DIAGNOSIS — R29898 Other symptoms and signs involving the musculoskeletal system: Secondary | ICD-10-CM | POA: Diagnosis not present

## 2021-06-28 DIAGNOSIS — I6981 Attention and concentration deficit following other cerebrovascular disease: Secondary | ICD-10-CM

## 2021-06-28 NOTE — Therapy (Signed)
[facility] [address] [place], [place], [address] Phone: [phone]   Fax:  [phone]  Speech Language Pathology Treatment  Patient Details  Name: Melvin Taylor MRN: [id number] Date of Birth: 07/09/1966 Referring Provider (SLP): [name], NP   Encounter Date: 06/28/2021   End of Session - 06/28/21 1346     Visit Number 3    Number of Visits 7    Authorization Type Medicaid [place] Access    Authorization Time Period 06/10/21-07/07/21    SLP Start Time 0935    SLP Stop Time  0952    SLP Time Calculation (min) 17 min    Activity Tolerance Patient tolerated treatment well             Past Medical History:  Diagnosis Date   Anxiety    Bipolar 1 disorder ([place])    Bone spur    left heel   Cervical radiculopathy    Chest pain    Chronic back pain    Chronic chest wall pain    Chronic neck pain    Coronary artery disease    a. s/p CABG in 03/2016 with LIMA-LAD, SVG-D1, SVG-RCA, and Seq SVG-mid and distal OM   Depression    Diabetes mellitus    Type II   Hallucinations    "long history of them"   Headache(784.0)    History of gout    HTN (hypertension)    Insomnia    Myocardial infarction ([place]) 2017   Neuropathy    Pain management    Polysubstance abuse ([place])    Right leg pain    chronic   Schizophrenia ([place])    Seizures ([place])    last sz between July 5-9th, 2016; epilepsy   Shortness of breath dyspnea    with exertion   Sleep apnea    Transfusion of blood product refused for religious reason     Past Surgical History:  Procedure Laterality Date   ANTERIOR CERVICAL DECOMP/DISCECTOMY FUSION N/A 12/14/2015   Procedure: ANTERIOR CERVICAL DECOMPRESSION/DISCECTOMY FUSION CERVICAL FIVE -SIX;  Surgeon: Earnie Larsson, MD;  [facility];  Service: Neurosurgery;  Laterality: N/A;   BIOPSY  05/07/2015   Procedure: BIOPSY (Gastric);  Surgeon: [name], MD;  Location: [facility];  Service: Endoscopy;;    CARDIAC CATHETERIZATION N/A 03/07/2016   Procedure: Left Heart Cath and Coronary Angiography;  Surgeon: Belva Crome, MD;  Location: [facility] CV LAB;  Service: Cardiovascular;  Laterality: N/A;   COLONOSCOPY WITH PROPOFOL N/A 05/07/2015   [id number] diverticulosis, multiple colon polyps removed, tubular adenoma, serrated colon polyp. Next colonoscopy October 2019   COLONOSCOPY WITH PROPOFOL N/A 08/02/2018   Procedure: COLONOSCOPY WITH PROPOFOL;  Surgeon: [name], MD;  [facility];  Service: Endoscopy;  Laterality: N/A;  12:00pm   CORONARY ARTERY BYPASS GRAFT N/A 03/28/2016   Procedure: CORONARY ARTERY BYPASS GRAFTING (CABG) x 5 USING GREATER SAPHENOUS VEIN;  Surgeon: [name], MD;  Location: [facility] OR;  Service: Open Heart Surgery;  Laterality: N/A;   ENDOVEIN HARVEST OF GREATER SAPHENOUS VEIN Right 03/28/2016   Procedure: ENDOVEIN HARVEST OF GREATER SAPHENOUS VEIN;  Surgeon: [name], MD;  Location: [facility];  Service: Open Heart Surgery;  Laterality: Right;   ESOPHAGEAL DILATION N/A 05/07/2015   Procedure: ESOPHAGEAL DILATION WITH 56FR MALONEY DILATOR;  Surgeon: [name], MD;  Location: [facility];  Service: Endoscopy;  Laterality: N/A;   ESOPHAGOGASTRODUODENOSCOPY (EGD) WITH PROPOFOL  N/A 05/07/2015   RMR: Status post dilation of normal esophagus. Gastritis.   IR CT HEAD LTD  12/17/2020   IR CT HEAD LTD  12/17/2020   IR INTRA CRAN STENT  12/17/2020   IR PERCUTANEOUS ART THROMBECTOMY/INFUSION INTRACRANIAL INC DIAG ANGIO  12/17/2020   IR RADIOLOGIST EVAL & MGMT  03/25/2021   KNEE SURGERY Left    arthroscopy   MANDIBLE FRACTURE SURGERY     POLYPECTOMY  05/07/2015   Procedure: POLYPECTOMY (Hepatic Flexure, Distal Transverse Colon, Rectal);  Surgeon: [name], MD;  Location: [facility];  Service: Endoscopy;;   RADIOLOGY WITH ANESTHESIA N/A 12/16/2020   Procedure: IR WITH ANESTHESIA;  Surgeon: Luanne Bras, MD;  Location: [facility];  Service: Radiology;  Laterality:  N/A;   TEE WITHOUT CARDIOVERSION N/A 03/28/2016   Procedure: TRANSESOPHAGEAL ECHOCARDIOGRAM (TEE);  Surgeon: [name], MD;  Location: [facility];  Service: Open Heart Surgery;  Laterality: N/A;   TONSILLECTOMY      There were no vitals filed for this visit.   Subjective Assessment - 06/28/21 1334     Subjective "I am tired."    Patient is accompained by: --   caregiver, Peggy   Currently in Pain? No/denies                   ADULT SLP TREATMENT - 06/28/21 1342       General Information   Behavior/Cognition Alert;Cooperative;Pleasant mood    Patient Positioning Upright in chair    Oral care provided N/A    HPI Pt is a 55 y/o male S/P Left MCA CVA which occurred on 12/16/20. Patient spent 2 weeks in CIR at [facility] No [facility] therapy was received. He reports that he has a home health aid every day assisting with meal prep and housekeeping tasks 9am-4pm. [name], NP has referred patient to speech pathology for evaluation and treatment.      Treatment Provided   Treatment provided Cognitive-Linquistic      Pain Assessment   Pain Assessment No/denies pain      Cognitive-Linquistic Treatment   Treatment focused on Cognition;Patient/family/caregiver education    Skilled Treatment Limited session today due to Pt arriving 35 minutes late for therapy. SLP reiterated importance of using his calendar and schedule several times a day to help him track daily events and appointments.      Assessment / Recommendations / Plan   Plan Continue with current plan of care      Progression Toward Goals   Progression toward goals Not progressing toward goals (comment)   poor attendance             SLP Education - 06/28/21 1345     Education Details printed updated schedule for Pt    Person(s) Educated Patient;Caregiver(s)    Methods Explanation;Handout    Comprehension Verbalized understanding              SLP Short Term Goals - 06/28/21 1352       SLP  SHORT TERM GOAL #1   Title Pt will record 3 or greater weekly appointments, reminders, to-do items in external memory devices to facilitate task completion with min cues from caregivers/SLP.    Baseline Family is completing this for him    Time 4    Period Weeks    Status On-going    Target Date 07/07/21      SLP SHORT TERM GOAL #2   Title Pt will increase recall for requested information  to 90% acc during functional memory exercises with use of compensatory strategies as needed when provided min cues.    Baseline 66%    Time 4    Period Weeks    Status On-going    Target Date 07/07/21      SLP SHORT TERM GOAL #3   Title Pt will complete moderately complex visual scanning activities during pencil/paper tasks with 90% acc with min assist and use of strategies as needed.    Baseline 70% clock drawing    Time 4    Period Weeks    Status On-going    Target Date 07/07/21      SLP SHORT TERM GOAL #4   Title Pt will complete moderate level reasoning and problem solving tasks with 80% acc with min cues provided from SLP.    Baseline 60% acc    Time 4    Period Weeks    Status On-going    Target Date 07/07/21      SLP SHORT TERM GOAL #5   Title Pt will increase recall of short paragraphs to 80% acc via responding to short questions with mi/mod assist for use of recall strategies from SLP.    Baseline 20%    Time 4    Period Weeks    Status On-going    Target Date 07/07/21              SLP Long Term Goals - 06/17/21 1339       SLP LONG TERM GOAL #1   Title Same as short              Plan - 06/28/21 1347     Clinical Impression Statement Pt has missed 4 of his 8 scheduled SLP sessions to which he attributes to difficulty with transportation. He arrived 35 minutes late for today's appointment. Apparently [facility] arrived at his house, but he was not ready, so a caregiver drove him when she arrived to his residence. He had ~5 different schedules in his folder, so SLP  assisted in organizing his folder and tossing the old schedules and providing an updated one. His caregiver asked he he could have some putty to work with at home, and SLP found the HEP provided by OT in his folder. Pt has been given putty at home. Pt stated that he needs to find it. Limited progress made toward cognitive goals due to poor Pt attendance and motivation. We have one more scheduled SLP session this week and will likely plan for d/c.    Speech Therapy Frequency 2x / week    Duration --   3-4 weeks   Treatment/Interventions Cognitive reorganization;SLP instruction and feedback;Internal/external aids;Compensatory strategies;Patient/family education;Cueing hierarchy;Language facilitation    Potential to Achieve Goals Fair    Potential Considerations Severity of impairments;Previous level of function    SLP Home Exercise Plan Pt will completed HEP as assigned to facilitate carryover of treatment strategies and techniques in home environment with use of written cues as needed.    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   Malnutrition of moderate degree 12/18/2020  Middle cerebral artery embolism, 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   Encounter for intubation    Renal insufficiency    Malignant hypertension    Acute ischemic left MCA stroke ([place]) 12/16/2020   Current smoker 06/21/2019   Seizure-like activity ([place]) 05/06/2019   Diabetes mellitus ([place]) 04/11/2019   Anemia, chronic disease 07/04/2018   Other specified diseases of the digestive system 10/30/2017   CAD (coronary artery disease) 03/28/2016   Chest pain 03/21/2016    Mixed hyperlipidemia 03/11/2016   Ischemic chest pain [facility])    NSTEMI (non-ST elevated myocardial infarction) ([place])    Coronary atherosclerosis of native coronary artery 03/03/2016   Cervical spinal stenosis 12/14/2015   Loss of weight 08/25/2015   Essential hypertension, benign 07/23/2015   Personal history of noncompliance with medical treatment, presenting hazards to health 07/23/2015   Abdominal pain 06/08/2015   Constipation 06/08/2015   History of colonic polyps    Diverticulosis of colon without hemorrhage    Mucosal abnormality of stomach    Encounter for screening colonoscopy 04/16/2015   Dysphagia 04/16/2015   Partial symptomatic epilepsy with complex partial seizures, intractable, without status epilepticus ([place]) 11/10/2014   Bipolar disorder, unspecified ([place]) 10/15/2014   Schizophrenia ([place]) 10/15/2014   DM type 2 causing vascular disease ([place]) 06/29/2014   Musculoskeletal pain 06/29/2014   Hyperglycemia without ketosis 09/07/2013   Degeneration disease of medial meniscus 01/13/2011   Other internal derangements of unspecified knee 01/13/2011   Knee pain 12/28/2010   Medial meniscus, posterior horn derangement 12/28/2010   Thank you,  Genene Churn, CCC-SLP [phone]  Genene Churn, [place] 06/28/2021, 1:53 PM  [facility] [address] [place], [place], [address] Phone: [phone]   Fax:  [phone]   Name: Melvin Taylor MRN: [id number] Date of Birth: 07-07-1966

## 2021-06-29 ENCOUNTER — Encounter (HOSPITAL_COMMUNITY): Payer: Self-pay

## 2021-06-29 ENCOUNTER — Ambulatory Visit (HOSPITAL_COMMUNITY)
Admission: RE | Admit: 2021-06-29 | Discharge: 2021-06-29 | Disposition: A | Discharge status: Home / Self Care | Payer: Medicaid Other | Source: Ambulatory Visit | Attending: Interventional Radiology | Admitting: Interventional Radiology

## 2021-06-29 DIAGNOSIS — I6602 Occlusion and stenosis of left middle cerebral artery: Secondary | ICD-10-CM | POA: Insufficient documentation

## 2021-06-29 LAB — POCT I-STAT CREATININE: Creatinine, Ser: 3.2 mg/dL — ABNORMAL HIGH (ref 0.61–1.24)

## 2021-06-29 MED ORDER — IOHEXOL 350 MG/ML SOLN: 75.0000 mL | Freq: Once | INTRAVENOUS | Status: DC | PRN

## 2021-07-01 ENCOUNTER — Telehealth (HOSPITAL_COMMUNITY): Payer: Self-pay

## 2021-07-01 ENCOUNTER — Ambulatory Visit (HOSPITAL_COMMUNITY): Payer: Medicaid Other | Attending: Adult Health | Admitting: Speech Pathology

## 2021-07-01 ENCOUNTER — Other Ambulatory Visit: Payer: Self-pay

## 2021-07-01 ENCOUNTER — Encounter (HOSPITAL_COMMUNITY): Payer: Self-pay | Admitting: Speech Pathology

## 2021-07-01 DIAGNOSIS — I6981 Attention and concentration deficit following other cerebrovascular disease: Secondary | ICD-10-CM | POA: Diagnosis present

## 2021-07-01 DIAGNOSIS — R41841 Cognitive communication deficit: Secondary | ICD-10-CM | POA: Insufficient documentation

## 2021-07-01 NOTE — Telephone Encounter (Signed)
Called to schedule mra, no answer, left vm. AW  ?

## 2021-07-01 NOTE — Therapy (Signed)
[facility] [address] [place], [place], [address] Phone: [phone]   Fax:  [phone]  Speech Language Pathology Treatment  Patient Details  Name: Melvin Taylor MRN: [id number] Date of Birth: [birth year] Referring Provider (SLP): [name], NP   Encounter Date: 07/01/2021   End of Session - 07/01/21 1119     Visit Number 4    Number of Visits 7    Authorization Type Medicaid [place] Access    Authorization Time Period 06/10/21-07/07/21    SLP Start Time 1035    SLP Stop Time  1120    SLP Time Calculation (min) 45 min    Activity Tolerance Patient tolerated treatment well             Past Medical History:  Diagnosis Date   Anxiety    Bipolar 1 disorder ([place])    Bone spur    left heel   Cervical radiculopathy    Chest pain    Chronic back pain    Chronic chest wall pain    Chronic neck pain    Coronary artery disease    a. s/p CABG in 03/2016 with LIMA-LAD, SVG-D1, SVG-RCA, and Seq SVG-mid and distal OM   Depression    Diabetes mellitus    Type II   Hallucinations    "long history of them"   Headache(784.0)    History of gout    HTN (hypertension)    Insomnia    Myocardial infarction ([place]) 2017   Neuropathy    Pain management    Polysubstance abuse ([place])    Right leg pain    chronic   Schizophrenia ([place])    Seizures ([place])    last sz between July 5-9th, 2016; epilepsy   Shortness of breath dyspnea    with exertion   Sleep apnea    Transfusion of blood product refused for religious reason     Past Surgical History:  Procedure Laterality Date   ANTERIOR CERVICAL DECOMP/DISCECTOMY FUSION N/A 12/14/2015   Procedure: ANTERIOR CERVICAL DECOMPRESSION/DISCECTOMY FUSION CERVICAL FIVE -SIX;  Surgeon: Earnie Larsson, MD;  Location: [facility];  Service: Neurosurgery;  Laterality: N/A;   BIOPSY  05/07/2015   Procedure: BIOPSY (Gastric);  Surgeon: [name], MD;  Location: [facility];  Service: Endoscopy;;    CARDIAC CATHETERIZATION N/A 03/07/2016   Procedure: Left Heart Cath and Coronary Angiography;  Surgeon: Belva Crome, MD;  Location: [facility];  Service: Cardiovascular;  Laterality: N/A;   COLONOSCOPY WITH PROPOFOL N/A 05/07/2015   ONG:EXBMWUXLKG diverticulosis, multiple colon polyps removed, tubular adenoma, serrated colon polyp. Next colonoscopy October 2019   COLONOSCOPY WITH PROPOFOL N/A 08/02/2018   Procedure: COLONOSCOPY WITH PROPOFOL;  Surgeon: [name], MD;  Location: [facility];  Service: Endoscopy;  Laterality: N/A;  12:00pm   CORONARY ARTERY BYPASS GRAFT N/A 03/28/2016   Procedure: CORONARY ARTERY BYPASS GRAFTING (CABG) x 5 USING GREATER SAPHENOUS VEIN;  Surgeon: [name], MD;  Location: [facility];  Service: Open Heart Surgery;  Laterality: N/A;   ENDOVEIN HARVEST OF GREATER SAPHENOUS VEIN Right 03/28/2016   Procedure: ENDOVEIN HARVEST OF GREATER SAPHENOUS VEIN;  Surgeon: [name], MD;  Location: [facility];  Service: Open Heart Surgery;  Laterality: Right;   ESOPHAGEAL DILATION N/A 05/07/2015   Procedure: ESOPHAGEAL DILATION WITH 56FR MALONEY DILATOR;  Surgeon: [name], MD;  Location: [facility];  Service: Endoscopy;  Laterality: N/A;   ESOPHAGOGASTRODUODENOSCOPY (EGD) WITH PROPOFOL  N/A 05/07/2015   RMR: Status post dilation of normal esophagus. Gastritis.   IR CT HEAD LTD  12/17/2020   IR CT HEAD LTD  12/17/2020   IR INTRA CRAN STENT  12/17/2020   IR PERCUTANEOUS ART THROMBECTOMY/INFUSION INTRACRANIAL INC DIAG ANGIO  12/17/2020   IR RADIOLOGIST EVAL & MGMT  03/25/2021   KNEE SURGERY Left    arthroscopy   MANDIBLE FRACTURE SURGERY     POLYPECTOMY  05/07/2015   Procedure: POLYPECTOMY (Hepatic Flexure, Distal Transverse Colon, Rectal);  Surgeon: [name], MD;  Location: [facility];  Service: Endoscopy;;   RADIOLOGY WITH ANESTHESIA N/A 12/16/2020   Procedure: IR WITH ANESTHESIA;  Surgeon: Luanne Bras, MD;  Location: [facility];  Service: Radiology;  Laterality: N/A;    TEE WITHOUT CARDIOVERSION N/A 03/28/2016   Procedure: TRANSESOPHAGEAL ECHOCARDIOGRAM (TEE);  Surgeon: [name], MD;  Location: [facility];  Service: Open Heart Surgery;  Laterality: N/A;   TONSILLECTOMY      There were no vitals filed for this visit.   Subjective Assessment - 07/01/21 1115     Subjective "I have been shaking."    Special Tests SLUMS ([place] Mental Status Examination)    Currently in Pain? 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 Patient spent 2 weeks in CIR at [facility]. No [facility] therapy was received. He reports that he has a home health aid every day assisting with meal prep and housekeeping tasks 9am-4pm. [name], NP has referred patient to speech pathology for evaluation and treatment.      Treatment Provided   Treatment provided Cognitive-Linquistic      Pain Assessment   Pain Assessment No/denies pain      Cognitive-Linquistic Treatment   Treatment focused on Cognition;Patient/family/caregiver education    Skilled Treatment SLP provided skilled treatment targeting memory and attention via implementation of strategies during functional tasks.      Assessment / Recommendations / Plan   Plan Discharge SLP treatment due to (comment);Other (Comment)   limited attendance and progress     Progression Toward Goals   Progression toward goals Not progressing toward goals (comment)              SLP Education - 07/01/21 1118     Education Details Provided printout of his treatment team and recommendation to have family/caregivers write all information down for him at home    Person(s) Educated Patient    Methods Handout    Comprehension Verbalized understanding               SLP Short Term Goals - 07/01/21 1158       SLP SHORT TERM GOAL #1   Title Pt will record 3 or greater weekly appointments, reminders, to-do items in external memory devices to facilitate task completion with min cues from caregivers/SLP.    Baseline Family is completing this for him    Time 4    Period Weeks    Status Not Met    Target Date 07/07/21      SLP SHORT TERM GOAL #2   Title Pt will increase recall for  requested information to 90% acc during functional memory exercises with use of compensatory strategies as needed when provided min cues.    Baseline 66%    Time 4    Period Weeks    Status Partially Met    Target Date 07/07/21      SLP SHORT TERM GOAL #3   Title Pt will complete moderately complex visual scanning activities during pencil/paper tasks with 90% acc with min assist and use of strategies as needed.    Baseline 70% clock drawing    Time 4    Period Weeks    Status Not Met    Target Date 07/07/21      SLP SHORT TERM GOAL #4   Title Pt will complete moderate level reasoning and problem solving tasks with 80% acc with min cues provided from SLP.    Baseline 60% acc    Time 4    Period Weeks    Status Partially Met    Target Date 07/07/21      SLP SHORT TERM GOAL #5   Title Pt will increase recall of short paragraphs to 80% acc via responding to short questions with mi/mod assist for use of recall strategies from SLP.    Baseline 20%    Time 4    Period Weeks    Status Partially Met    Target Date 07/07/21              SLP Long Term Goals - 06/17/21 1339       SLP LONG TERM GOAL #1   Title Same as short              Plan - 07/01/21 1142     Clinical Impression Statement Pt forgot to bring his folder to therapy today. He has made limited progress toward goals due to poor attendance and poor motivation. Pt reports that his medications make him tired and that he has been "shaking" at times. He was encouraged to discuss with his doctor. Pt  required mod/max cues for short paragraph recall, however when presented with personally relevant information (phone calls received in therapy regarding meals on wheels and a new chair), he required only mi/mod cues. SLP provided a list of his medical team for him to add to his folder at home. He was able to locate answers to questions regarding his team with min cues. Most goals were not met due to reasons stated above. Pt has the necessary support at home to compensate for memory deficits and he reports feeling satisfied with his current level of function. Pt should continue to use the calendars and written supports provided by SLP at home. Will d/c Pt from SLP services.    Duration --   3-4 weeks   Treatment/Interventions Cognitive reorganization;SLP instruction and feedback;Internal/external aids;Compensatory strategies;Patient/family education;Cueing hierarchy;Language facilitation    Potential to Achieve Goals Fair    Potential Considerations Severity of impairments;Previous level of function    SLP Home Exercise Plan Pt will completed HEP as assigned to facilitate carryover of treatment strategies and techniques in home environment with use of written cues as needed.    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   Malnutrition of moderate degree 12/18/2020   Middle cerebral artery embolism, 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   Encounter for intubation    Renal insufficiency    Malignant hypertension    Acute ischemic left MCA stroke ([place]) 12/16/2020   Current smoker 06/21/2019   Seizure-like activity ([place]) 05/06/2019   Diabetes mellitus  ([place]) 04/11/2019   Anemia, chronic disease 07/04/2018   Other specified diseases of the digestive system 10/30/2017   CAD (coronary artery disease) 03/28/2016   Chest pain 03/21/2016   Mixed hyperlipidemia 03/11/2016   Ischemic chest pain ([facility])    NSTEMI (non-ST elevated myocardial infarction) ([place])    Coronary atherosclerosis of native coronary artery 03/03/2016   Cervical spinal stenosis 12/14/2015   Loss of weight 08/25/2015   Essential hypertension, benign 07/23/2015   Personal history of noncompliance with medical treatment, presenting hazards to health 07/23/2015   Abdominal pain 06/08/2015   Constipation 06/08/2015   History of colonic polyps    Diverticulosis of colon without hemorrhage    Mucosal abnormality of stomach    Encounter for screening colonoscopy 04/16/2015   Dysphagia 04/16/2015   Partial symptomatic epilepsy with complex partial seizures, intractable, without status epilepticus ([place]) 11/10/2014   Bipolar disorder, unspecified ([place]) 10/15/2014   Schizophrenia ([place]) 10/15/2014   DM type 2 causing vascular disease ([place]) 06/29/2014   Musculoskeletal pain 06/29/2014   Hyperglycemia without ketosis 09/07/2013   Degeneration disease of medial meniscus 01/13/2011   Other internal derangements of unspecified knee 01/13/2011   Knee pain 12/28/2010   Medial meniscus, posterior horn derangement 12/28/2010   SPEECH THERAPY DISCHARGE SUMMARY  Visits from Start of Care: 4  Current functional level related to goals / functional outcomes: Partially met   Remaining deficits: Moderate deficits in memory and attentiong   Education / Equipment: HEP provided and written supports for memory deficits   Patient agrees to discharge. Patient goals were partially met. Patient is being discharged due to lack of progress..   Thank you,  Genene Churn, [place]  Genene Churn [place] 07/01/2021, 11:59 AM  [facility] [address] [place], [place], [address] Phone: [phone]   Fax:  [phone]   Name: Melvin Taylor MRN: [id number] Date of Birth: [birth year]

## 2021-07-05 ENCOUNTER — Ambulatory Visit (HOSPITAL_COMMUNITY): Payer: Medicaid Other | Admitting: Speech Pathology

## 2021-07-07 ENCOUNTER — Ambulatory Visit (HOSPITAL_COMMUNITY): Payer: Medicaid Other | Admitting: Speech Pathology

## 2021-07-12 ENCOUNTER — Ambulatory Visit (HOSPITAL_COMMUNITY)
Admission: RE | Admit: 2021-07-12 | Discharge: 2021-07-12 | Disposition: A | Discharge status: Home / Self Care | Payer: Medicaid Other | Source: Ambulatory Visit | Attending: Interventional Radiology | Admitting: Interventional Radiology

## 2021-07-12 ENCOUNTER — Ambulatory Visit (HOSPITAL_COMMUNITY): Payer: Medicaid Other | Admitting: Speech Pathology

## 2021-07-12 ENCOUNTER — Other Ambulatory Visit: Payer: Self-pay

## 2021-07-12 DIAGNOSIS — I6602 Occlusion and stenosis of left middle cerebral artery: Secondary | ICD-10-CM | POA: Diagnosis present

## 2021-07-12 NOTE — Progress Notes (Deleted)
[facility] [address] [place]. [place] [address] [phone]       HOSPITAL FOLLOW UP NOTE  [name] Date of Birth:  06/04/1966 Medical Record Number:  [id number]   Reason for Referral:  hospital stroke follow up    SUBJECTIVE:   CHIEF COMPLAINT:  Chief Complaint  Patient presents with   Follow-up    RM 3 alone Pt is well, has been having trouble with L hand, sever headaches, very fatigue and cant functions.      HPI:   Update 07/12/2021 JM: Patient returns for 50-monthstroke follow-up  Overall stable -denies new stroke/TIA symptoms Continued RUE weakness - c/o wrist/hand pain *** Continued cognitive impairment - limited progress with SLP due to poor attendance and poor motivation C/o fatigue - questions med side effect ***  Compliant on Brilinta and atorvastatin -denies side effects Blood pressure today *** Tobacco use ***  Followed by Dr. PLeta Baptistfor history of seizures on Depakote and lamotrigine. No witnessed seizure activity  F/u with IR 8/23 with repeat MRA yesterday - planned to continue Brilinta until f/u imaging completed      History provided for reference purposes only Initial visit 03/09/2021 JM: [name] being seen for hospital follow-up accompanied by his sister, [name]  Overall stable since discharge without new stroke/TIA symptoms.  Reports residual RUE weakness with gradual improvement although still has difficulty with use.  Denies residual RLE weakness.  Completed HH therapies.  Sister also reports short-term memory impairment which was present previously but worsened post stroke.  He is currently living with his aunt.  Previously living on his own but had an aide 6 days/week during the day.  Able to maintain ADLs independently.  Needs assistance for IADLs. Sister also reports generalized tremors but per patient, these have been present prior to his stroke.  They are usually worse in the morning and get  better as the day goes on.  Compliant on Brilinta and atorvastatin 80 mg daily -denies side effects.  Blood pressure today 150/92.  Routinely monitors at home and typically stable.  Complete tobacco cessation since discharge.  No seizure activity with continued use of Depakote 10019mBID and Lamictal 50 mg twice daily. Sister reports likely medication noncompliance previously as they found multiple full bottles of medications in his home.  Family now provides assistance for medication administration.  No further concerns at this time.  Stroke admission 12/13/2020 Mr. Melvin SHEFFLERs a [age].o. male with history of seizure on Depakote and Lamictal, CAD status post CABG, diabetes, hypertension, hyperlipidemia, OSA on CPAP, stage 3 CKD, substance abuse admitted to OSH on 12/16/2020 for seizure, right-sided weakness and slurred speech.  Personally reviewed hospitalization, pertinent progress notes, imaging and lab work.  NIHSS 14. No tPA d/t outside window.  CTA L M1 occlusion and CT perfusion 160cc at risk (although slightly overestimated).  [facility].  Complete stroke work-up revealed left MCA infarct due to left M1 occlusion s/p IR with TICI 3 reperfusion and L MCA stenting, secondary to large vessel disease.  Repeat CT head showed trace SAH postprocedure.  In addition to acute infarcts, MRI showed small chronic cortical infarcts in the right occipital lobe. MRA diffuse intracranial stenosis.  EF 65 to 70%.  LDL 116 - increased atorvastatin from 4045mo 84m29mily.  A1c 9.7.  Initiated Brilinta post stent, hx of asa allergy.  History of seizure disorder on Depakote and Lamictal with recent breakthrough  seizure discharged on Depakote 1000 mg twice daily (1551m BID PTA) and Lamictal 50 mg twice daily.  HTN stable long-term BP goal 130-150.  Current tobacco use.  UDS positive for THC.  Residual deficit of cognitive impairment, mild dysarthria, right-sided weakness.  Evaluated by  therapies and discharged to CIR.      ROS:   14 system review of systems performed and negative with exception of those listed in HPI  PMH:  Past Medical History:  Diagnosis Date   Anxiety    Bipolar 1 disorder ([place]    Bone spur    left heel   Cervical radiculopathy    Chest pain    Chronic back pain    Chronic chest wall pain    Chronic neck pain    Coronary artery disease    a. s/p CABG in 03/2016 with LIMA-LAD, SVG-D1, SVG-RCA, and Seq SVG-mid and distal OM   Depression    Diabetes mellitus    Type II   Hallucinations    "long history of them"   Headache(784.0)    History of gout    HTN (hypertension)    Insomnia    [place]ocardial infarction ([place] 2017   Neuropathy    Pain management    Polysubstance abuse ([place]    Right leg pain    chronic   Schizophrenia ([place]    Seizures ([place]    last sz between July 5-9th, 2016; epilepsy   Shortness of breath dyspnea    with exertion   Sleep apnea    Transfusion of blood product refused for religious reason     PSH:  Past Surgical History:  Procedure Laterality Date   ANTERIOR CERVICAL DECOMP/DISCECTOMY FUSION N/A 12/14/2015   Procedure: ANTERIOR CERVICAL DECOMPRESSION/DISCECTOMY FUSION CERVICAL FIVE -SIX;  Surgeon: HEarnie Larsson MD;  [facility];  Service: Neurosurgery;  Laterality: N/A;   BIOPSY  05/07/2015   Procedure: BIOPSY (Gastric);  Surgeon: RDaneil Dolin MD;  Location: [facility];  Service: Endoscopy;;   CARDIAC CATHETERIZATION N/A 03/07/2016   Procedure: Left Heart Cath and Coronary Angiography;  Surgeon: HBelva Crome MD;  [facility];  Service: Cardiovascular;  Laterality: N/A;   COLONOSCOPY WITH PROPOFOL N/A 05/07/2015   RZOX:[id number], multiple colon polyps removed, tubular adenoma, serrated colon polyp. Next colonoscopy October 2019   COLONOSCOPY WITH PROPOFOL N/A 08/02/2018   Procedure: COLONOSCOPY WITH PROPOFOL;  Surgeon: RDaneil Dolin MD;  [facility]: [facility];   Service: Endoscopy;  Laterality: N/A;  12:00pm   CORONARY ARTERY BYPASS GRAFT N/A 03/28/2016   Procedure: CORONARY ARTERY BYPASS GRAFTING (CABG) x 5 USING GREATER SAPHENOUS VEIN;  Surgeon: BGaye Pollack MD;  [facility];  Service: Open Heart Surgery;  Laterality: N/A;   ENDOVEIN HARVEST OF GREATER SAPHENOUS VEIN Right 03/28/2016   Procedure: ENDOVEIN HARVEST OF GREATER SAPHENOUS VEIN;  Surgeon: BGaye Pollack MD;  [place]  Service: Open Heart Surgery;  Laterality: Right;   ESOPHAGEAL DILATION N/A 05/07/2015   Procedure: ESOPHAGEAL DILATION WITH 56FR MALONEY DILATOR;  Surgeon: RDaneil Dolin MD;  Location: [facility];  Service: Endoscopy;  Laterality: N/A;   ESOPHAGOGASTRODUODENOSCOPY (EGD) WITH PROPOFOL N/A 05/07/2015   RMR: Status post dilation of normal esophagus. Gastritis.   IR CT HEAD LTD  12/17/2020   IR CT HEAD LTD  12/17/2020   IR INTRA CRAN STENT  12/17/2020   IR PERCUTANEOUS ART THROMBECTOMY/INFUSION INTRACRANIAL INC DIAG ANGIO  12/17/2020   IR RADIOLOGIST EVAL &  MGMT  03/25/2021   KNEE SURGERY Left    arthroscopy   MANDIBLE FRACTURE SURGERY     POLYPECTOMY  05/07/2015   Procedure: POLYPECTOMY (Hepatic Flexure, Distal Transverse Colon, Rectal);  Surgeon: [name], MD;  Location: [facility];  Service: Endoscopy;;   RADIOLOGY WITH ANESTHESIA N/A 12/16/2020   Procedure: IR WITH ANESTHESIA;  Surgeon: Luanne Bras, MD;  Location: [place];  Service: Radiology;  Laterality: N/A;   TEE WITHOUT CARDIOVERSION N/A 03/28/2016   Procedure: TRANSESOPHAGEAL ECHOCARDIOGRAM (TEE);  Surgeon: [name], MD;  Location: [place];  Service: Open Heart Surgery;  Laterality: N/A;   TONSILLECTOMY      Social History:  Social History   Socioeconomic History   Marital status: Single    Spouse name: Not on file   Number of children: Not on file   Years of education: 11th grade   Highest education level: Not on file  Occupational History   Occupation: unemployed    Employer: NOT EMPLOYED   Tobacco Use   Smoking status: Former    Packs/day: 0.50    Years: 30.00    Pack years: 15.00    Types: Cigarettes   Smokeless tobacco: Never   Tobacco comments:    3 cigs per day  Vaping Use   Vaping Use: Never used  Substance and Sexual Activity   Alcohol use: Not Currently   Drug use: Not Currently    Types: Marijuana   Sexual activity: Not Currently  Other Topics Concern   Not on file  Social History Narrative   Lives alone   Drinks a cup of coffee a day   Social Determinants of Health   Financial Resource Strain: Not on file  Food Insecurity: Not on file  Transportation Needs: Not on file  Physical Activity: Not on file  Stress: Not on file  Social Connections: Not on file  Intimate Partner Violence: Not on file    Family History:  Family History  Problem Relation Age of Onset   Diabetes Father    Hypertension Father    Arthritis Other    Diabetes Other    Asthma Other    Stroke Sister     Medications:   Current Outpatient Medications on File Prior to Visit  Medication Sig Dispense Refill   albuterol (PROVENTIL HFA;VENTOLIN HFA) 108 (90 Base) MCG/ACT inhaler Inhale 2 puffs into the lungs every 4 (four) hours as needed for wheezing or shortness of breath.      amLODipine (NORVASC) 10 MG tablet Take 1 tablet (10 mg total) by mouth daily. 90 tablet 0   atorvastatin (LIPITOR) 80 MG tablet Take 1 tablet (80 mg total) by mouth at bedtime. 30 tablet 0   Blood Glucose Monitoring Suppl (ACCU-CHEK GUIDE ME) w/Device KIT 1 Piece by Does not apply route as directed. 1 kit 0   cycloSPORINE (RESTASIS) 0.05 % ophthalmic emulsion Place 1 drop into both eyes 2 (two) times daily.     divalproex (DEPAKOTE) 500 MG DR tablet Take 2 tablets (1,000 mg total) by mouth 2 (two) times daily. 120 tablet 2   escitalopram (LEXAPRO) 20 MG tablet Take 20 mg by mouth daily.      glimepiride (AMARYL) 2 MG tablet Take 1 tablet (2 mg total) by mouth daily with breakfast. 90 tablet 1    glucose blood (ACCU-CHEK GUIDE) test strip Monitor glucose 2 times a day. 150 each 2   hydrochlorothiazide (HYDRODIURIL) 25 MG tablet Take 1 tablet (25 mg total) by  mouth daily. 90 tablet 3   isosorbide mononitrate (IMDUR) 30 MG 24 hr tablet Take 1 tablet (30 mg total) by mouth daily. 90 tablet 3   lamoTRIgine (LAMICTAL) 25 MG tablet Take 50 mg by mouth 2 (two) times daily.     LANTUS SOLOSTAR 100 UNIT/ML Solostar Pen INJECT 40 UNITS INTO THE SKIN AT BEDTIME. 15 mL 0   Melatonin 10 MG CAPS Take 10 capsules by mouth at bedtime as needed (for sleep).     Multiple Vitamin (MULTIVITAMIN WITH MINERALS) TABS tablet Take 1 tablet by mouth daily.     pantoprazole (PROTONIX) 40 MG tablet Take 1 tablet (40 mg total) by mouth daily. (Patient taking differently: Take 40 mg by mouth daily as needed.)     senna-docusate (SENOKOT-S) 8.6-50 MG tablet Take 1 tablet by mouth at bedtime as needed for mild constipation or moderate constipation.     ticagrelor (BRILINTA) 90 MG TABS tablet Take 1 tablet (90 mg total) by mouth 2 (two) times daily. 60 tablet 5   nitroGLYCERIN (NITROSTAT) 0.4 MG SL tablet Place 1 tablet (0.4 mg total) under the tongue every 5 (five) minutes as needed for chest pain. 25 tablet 3   No current facility-administered medications on file prior to visit.    Allergies:   Allergies  Allergen Reactions   Aspirin Swelling and Other (See Comments)    Facial swelling       OBJECTIVE:  Physical Exam  Vitals:   07/13/21 1234  BP: 110/73  Pulse: 75  Weight: 142 lb (64.4 kg)  Height: 5' 5" (1.651 m)    Body mass index is 23.63 kg/m. No results found.  General: well developed, well nourished, pleasant middle-aged African-American male, seated, in no evident distress Head: head normocephalic and atraumatic.   Neck: supple with no carotid or supraclavicular bruits Cardiovascular: regular rate and rhythm, no murmurs Musculoskeletal: no deformity Skin:  no rash/petichiae Vascular:   Normal pulses all extremities   Neurologic Exam Mental Status: Awake and fully alert.  Fluent speech and language.  Oriented to place and time. Recent and remote memory impaired.  Attention span, concentration and fund of knowledge impaired.  Sister provides majority of history.  Mood and affect appropriate.  Cranial Nerves: Pupils equal, briskly reactive to light. Extraocular movements full without nystagmus. Visual fields full to confrontation. Hearing intact. Facial sensation intact. Face, tongue, palate moves normally and symmetrically.  Motor: Normal bulk and tone. Normal strength in all tested extremity muscles except decreased right hand dexterity Sensory.: intact to touch , pinprick , position and vibratory sensation.  Coordination: Rapid alternating movements normal in all extremities except slightly decreased right hand dexterity. Finger-to-nose mild RUE ataxia and heel-to-shin performed accurately bilaterally.  Mild tremor noted in all extremities with activation.  No evidence of resting tremor or cogwheel rigidity Gait and Station: Arises from chair without difficulty. Stance is normal. Gait demonstrates normal stride length and balance with with slight favoring of right leg Reflexes: 1+ and symmetric. Toes downgoing.         ASSESSMENT: Melvin Taylor is a 55 y.o. year old male with left MCA infarct on 12/16/2020 due to left M1 occlusion s/p IR with stenting secondary to ICAD after presenting with right-sided weakness and slurred speech.  Vascular risk factors include HTN, HLD, DM, CAD s/p CABG, hx of substance abuse, prior stroke on imaging (R occipital lobe), OSA and seizures on Depakote and Lamictal.      PLAN:  L MCA stroke:  Residual deficit: RUE weakness, ataxia, gait impairment and worsening baseline cognition. Completed outpatient therapies - continue doing HEP as advised Needs f/u with Dr. Estanislado Pandy - will send message to IR requesting they contact his sister [name]  ([phone]) to schedule f/u visit Continue Brilinta (ticagrelor) 90 mg bid  and atorvastatin 80 mg daily for secondary stroke prevention.   Discussed secondary stroke prevention measures and importance of close PCP follow up for aggressive stroke risk factor management. I have gone over the pathophysiology of stroke, warning signs and symptoms, risk factors and their management in some detail with instructions to go to the closest emergency room for symptoms of concern. HTN: BP goal <130/90.  Stable on current regimen per PCP - BP meds refilled -ongoing refills will need to be obtained by PCP as these will likely be long-term medications HLD: LDL goal <70. Recent LDL 116 -continue atorvastatin 80 mg daily.  DMII: A1c goal<7.0. Recent A1c 9.7.  Continue to follow-up with PCP for close monitoring management Seizure disorder: On Depakote and Lamictal followed by Dr. Leta Baptist.  Depakote dosage recently decreased -longstanding generalized tremors may be a potential side effect - will see if they improve with recent dose adjustment.  Depakote level 71 on 12/22/2020.     Follow up in 4 months or call earlier if needed   CC:  [facility] provider: Dr. Leonie Man PCP: [name], FNP    I spent 59 minutes of face-to-face and non-face-to-face time with patient and sister.  This included previsit chart review including review of recent hospitalization, lab review, study review, order entry, electronic health record documentation, patient and sister education regarding recent stroke and likely etiology, residual deficits, secondary stroke prevention measures and importance of managing stroke risk factors, seizure disorder and answered all other questions to patient and sisters satisfaction   [name], AGNP-BC  [facility] [address] [place], [place] [address]  Phone [phone] Fax [phone] Note: This document was prepared with digital dictation and possible  smart phrase technology. Any transcriptional errors that result from this process are unintentional.

## 2021-07-13 ENCOUNTER — Ambulatory Visit: Payer: Medicaid Other | Admitting: Adult Health

## 2021-07-13 ENCOUNTER — Encounter: Payer: Self-pay | Admitting: Adult Health

## 2021-07-13 VITALS — BP 110/73 | HR 75 | Ht 65.0 in | Wt 142.0 lb

## 2021-07-13 DIAGNOSIS — G4486 Cervicogenic headache: Secondary | ICD-10-CM

## 2021-07-13 DIAGNOSIS — I63512 Cerebral infarction due to unspecified occlusion or stenosis of left middle cerebral artery: Secondary | ICD-10-CM | POA: Diagnosis not present

## 2021-07-13 DIAGNOSIS — G40219 Localization-related (focal) (partial) symptomatic epilepsy and epileptic syndromes with complex partial seizures, intractable, without status epilepticus: Secondary | ICD-10-CM

## 2021-07-13 DIAGNOSIS — I69319 Unspecified symptoms and signs involving cognitive functions following cerebral infarction: Secondary | ICD-10-CM | POA: Diagnosis not present

## 2021-07-13 DIAGNOSIS — R5383 Other fatigue: Secondary | ICD-10-CM | POA: Diagnosis not present

## 2021-07-13 NOTE — Patient Instructions (Addendum)
We will check imaging that was recently done with Dr. Estanislado Pandy to look for possible new stroke   Start taking iron and vitamin D as advised by your primary doctor  Follow back up with your prior provider who did your neck surgery as this could be contributing to your symptoms  Continue Brilinta (ticagrelor) 90 mg bid  and atorvastatin  for secondary stroke prevention  Continue to follow up with PCP regarding cholesterol and blood pressure management  Maintain strict control of hypertension with blood pressure goal below 130/90 and cholesterol with LDL cholesterol (bad cholesterol) goal below 70 mg/dL.   Signs of a Stroke? Follow the BEFAST method:  Balance Watch for a sudden loss of balance, trouble with coordination or vertigo Eyes Is there a sudden loss of vision in one or both eyes? Or double vision?  Face: Ask the person to smile. Does one side of the face droop or is it numb?  Arms: Ask the person to raise both arms. Does one arm drift downward? Is there weakness or numbness of a leg? Speech: Ask the person to repeat a simple phrase. Does the speech sound slurred/strange? Is the person confused ? Time: If you observe any of these signs, call 911.    Followup in the future with me in 4 months or call earlier if needed       Thank you for coming to see [place] at [facility]. I hope we have been able to provide you high quality care today.  You may receive a patient satisfaction survey over the next few weeks. We would appreciate your feedback and comments so that we may continue to improve ourselves and the health of our patients.

## 2021-07-13 NOTE — Progress Notes (Signed)
[facility] [address] [place]. [place] [address] [phone]       STROKE FOLLOW UP NOTE  [name] Date of Birth:  1965-12-25 Medical Record Number:  [id number]   Reason for Referral: stroke follow up    SUBJECTIVE:   CHIEF COMPLAINT:  Chief Complaint  Patient presents with   Follow-up    RM 3 alone Pt is well, has been having trouble with L hand, sever headaches, very fatigue and cant functions.      HPI:   Update 07/12/2021 JM: Patient returns for 57-monthstroke follow-up with sister present via cell phone  c/o worsening right hand weakness, fatigue, and headaches -Worsening RUE weakness - c/o wrist/hand pain and weakness and difficulty grasping in right hand. This has been present since stroke but believes this has been worsening.  Previously working with OT and placed on hold until further eval for fatigue can be completed but noted doing greater frequency of HEP (tried to educate multipel times per notes). Sister reports she would encourage him to do this exercise throughout the day as she felt this would be helpful. OT notes did note some decline of grip strength bilaterally  -C/o fatigue throughout the day over the past month, sleeping during the day and at night. He belives this is due to medication side effects. Sister reports currently taking abx for tooth infection and UTI. PCP also recently recommended starting iron supplement and vitamin D supplement but not yet started.   -C/o headache almost every other day (reports present since his stroke) present upon awakening and at night when laying in bed, right-sided radiating to back of neck, descried as throbbing and aching. Has not tried any conservative measures. Does endorse chronic neck pain with prior history of C5-6 ACDF . Does believe pain gradually worsening.    Continued cognitive impairment - limited progress with SLP due to poor attendance and poor motivation there has since  been discharged. Sister continues to manage medications ensuring compliance.   Compliant on Brilinta and atorvastatin -denies side effects Blood pressure today 110/73 Denies tobacco use.  Followed by Dr. PLeta Baptistfor history of seizures on Depakote and lamotrigine. No witnessed seizure activity. No recent medication changes  F/u with IR 8/23 in office with repeat MRA yesterday - planned to continue Brilinta until f/u imaging completed  No further concerns at this time     History provided for reference purposes only Initial visit 03/09/2021 JM: [name] being seen for hospital follow-up accompanied by his sister, Melvin Taylor  Overall stable since discharge without new stroke/TIA symptoms.  Reports residual RUE weakness with gradual improvement although still has difficulty with use.  Denies residual RLE weakness.  Completed HH therapies.  Sister also reports short-term memory impairment which was present previously but worsened post stroke.  He is currently living with his aunt.  Previously living on his own but had an aide 6 days/week during the day.  Able to maintain ADLs independently.  Needs assistance for IADLs. Sister also reports generalized tremors but per patient, these have been present prior to his stroke.  They are usually worse in the morning and get better as the day goes on.  Compliant on Brilinta and atorvastatin 80 mg daily -denies side effects.  Blood pressure today 150/92.  Routinely monitors at home and typically stable.  Complete tobacco cessation since discharge.  No seizure activity with continued use of Depakote 10062mBID and Lamictal 50 mg twice daily. Sister reports likely  medication noncompliance previously as they found multiple full bottles of medications in his home.  Family now provides assistance for medication administration.  No further concerns at this time.  Stroke admission 12/13/2020 [name] is a 55 y.o. male with history of seizure on Depakote  and Lamictal, CAD status post CABG, diabetes, hypertension, hyperlipidemia, OSA on CPAP, stage 3 CKD, substance abuse admitted to OSH on 12/16/2020 for seizure, right-sided weakness and slurred speech.  Personally reviewed hospitalization, pertinent progress notes, imaging and lab work.  NIHSS 14. No tPA d/t outside window.  CTA L M1 occlusion and CT perfusion 160cc at risk (although slightly overestimated).  Transferred to [facility] for emergent thrombectomy.  Complete stroke work-up revealed left MCA infarct due to left M1 occlusion s/p IR with TICI 3 reperfusion and L MCA stenting, secondary to large vessel disease.  Repeat CT head showed trace SAH postprocedure.  In addition to acute infarcts, MRI showed small chronic cortical infarcts in the right occipital lobe. MRA diffuse intracranial stenosis.  EF 65 to 70%.  LDL 116 - increased atorvastatin from 35m to 846mdaily.  A1c 9.7.  Initiated Brilinta post stent, hx of asa allergy.  History of seizure disorder on Depakote and Lamictal with recent breakthrough seizure discharged on Depakote 1000 mg twice daily (150035mID PTA) and Lamictal 50 mg twice daily.  HTN stable long-term BP goal 130-150.  Current tobacco use.  UDS positive for THC.  Residual deficit of cognitive impairment, mild dysarthria, right-sided weakness.  Evaluated by therapies and discharged to CIR.      ROS:   14 system review of systems performed and negative with exception of those listed in HPI  PMH:  Past Medical History:  Diagnosis Date   Anxiety    Bipolar 1 disorder ([place]  Bone spur    left heel   Cervical radiculopathy    Chest pain    Chronic back pain    Chronic chest wall pain    Chronic neck pain    Coronary artery disease    a. s/p CABG in 03/2016 with LIMA-LAD, SVG-D1, SVG-RCA, and Seq SVG-mid and distal OM   Depression    Diabetes mellitus    Type II   Hallucinations    "long history of them"   Headache(784.0)    History of gout    HTN (hypertension)     Insomnia    [place]ocardial infarction ([place]   Neuropathy    Pain management    [place]ysubstance abuse ([place]  Right leg pain    chronic   Schizophrenia ([place]  Seizures ([place]  last sz between July 5-9th, 2016; epilepsy   Shortness of breath dyspnea    with exertion   Sleep apnea    Transfusion of blood product refused for religious reason     PSH:  Past Surgical History:  Procedure Laterality Date   ANTERIOR CERVICAL DECOMP/DISCECTOMY FUSION N/A 12/14/2015   Procedure: ANTERIOR CERVICAL DECOMPRESSION/DISCECTOMY FUSION CERVICAL FIVE -SIX;  Surgeon: HenEarnie LarssonD;  [facility]: [facility];  Service: Neurosurgery;  Laterality: N/A;   BIOPSY  05/07/2015   Procedure: BIOPSY (Gastric);  Surgeon: RobDaneil DolinD;  Location: [facility];  Service: Endoscopy;;   CARDIAC CATHETERIZATION N/A 03/07/2016   Procedure: Left Heart Cath and Coronary Angiography;  Surgeon: HenBelva CromeD;  Location: [facility];  Service: Cardiovascular;  Laterality: N/A;   COLONOSCOPY WITH PROPOFOL N/A 05/07/2015  UXL:[id number] diverticulosis, multiple colon polyps removed, tubular adenoma, serrated colon polyp. Next colonoscopy October 2019   COLONOSCOPY WITH PROPOFOL N/A 08/02/2018   Procedure: COLONOSCOPY WITH PROPOFOL;  Surgeon: [name], MD;  Location: [facility];  Service: Endoscopy;  Laterality: N/A;  12:00pm   CORONARY ARTERY BYPASS GRAFT N/A 03/28/2016   Procedure: CORONARY ARTERY BYPASS GRAFTING (CABG) x 5 USING GREATER SAPHENOUS VEIN;  Surgeon: [name], MD;  Location: [place] [facility];  Service: Open Heart Surgery;  Laterality: N/A;   ENDOVEIN HARVEST OF GREATER SAPHENOUS VEIN Right 03/28/2016   Procedure: ENDOVEIN HARVEST OF GREATER SAPHENOUS VEIN;  Surgeon: [name], MD;  Location: [place];  Service: Open Heart Surgery;  Laterality: Right;   ESOPHAGEAL DILATION N/A 05/07/2015   Procedure: ESOPHAGEAL DILATION WITH 56FR MALONEY DILATOR;  Surgeon: [name], MD;  Location: [facility];   Service: Endoscopy;  Laterality: N/A;   ESOPHAGOGASTRODUODENOSCOPY (EGD) WITH PROPOFOL N/A 05/07/2015   RMR: Status post dilation of normal esophagus. Gastritis.   IR CT HEAD LTD  12/17/2020   IR CT HEAD LTD  12/17/2020   IR INTRA CRAN STENT  12/17/2020   IR PERCUTANEOUS ART THROMBECTOMY/INFUSION INTRACRANIAL INC DIAG ANGIO  12/17/2020   IR RADIOLOGIST EVAL & MGMT  03/25/2021   KNEE SURGERY Left    arthroscopy   MANDIBLE FRACTURE SURGERY     POLYPECTOMY  05/07/2015   Procedure: POLYPECTOMY (Hepatic Flexure, Distal Transverse Colon, Rectal);  Surgeon: [name], MD;  Location: [facility];  Service: Endoscopy;;   RADIOLOGY WITH ANESTHESIA N/A 12/16/2020   Procedure: IR WITH ANESTHESIA;  Surgeon: Luanne Bras, MD;  Location: [place];  Service: Radiology;  Laterality: N/A;   TEE WITHOUT CARDIOVERSION N/A 03/28/2016   Procedure: TRANSESOPHAGEAL ECHOCARDIOGRAM (TEE);  Surgeon: [name], MD;  Location: [place];  Service: Open Heart Surgery;  Laterality: N/A;   TONSILLECTOMY      Social History:  Social History   Socioeconomic History   Marital status: Single    Spouse name: Not on file   Number of children: Not on file   Years of education: 11th grade   Highest education level: Not on file  Occupational History   Occupation: unemployed    Employer: NOT EMPLOYED  Tobacco Use   Smoking status: Former    Packs/day: 0.50    Years: 30.00    Pack years: 15.00    Types: Cigarettes   Smokeless tobacco: Never   Tobacco comments:    3 cigs per day  Vaping Use   Vaping Use: Never used  Substance and Sexual Activity   Alcohol use: Not Currently   Drug use: Not Currently    Types: Marijuana   Sexual activity: Not Currently  Other Topics Concern   Not on file  Social History Narrative   Lives alone   Drinks a cup of coffee a day   Social Determinants of Health   Financial Resource Strain: Not on file  Food Insecurity: Not on file  Transportation Needs: Not on file  Physical  Activity: Not on file  Stress: Not on file  Social Connections: Not on file  Intimate Partner Violence: Not on file    Family History:  Family History  Problem Relation Age of Onset   Diabetes Father    Hypertension Father    Arthritis Other    Diabetes Other    Asthma Other    Stroke Sister     Medications:   Current Outpatient Medications  on File Prior to Visit  Medication Sig Dispense Refill   albuterol (PROVENTIL HFA;VENTOLIN HFA) 108 (90 Base) MCG/ACT inhaler Inhale 2 puffs into the lungs every 4 (four) hours as needed for wheezing or shortness of breath.      amLODipine (NORVASC) 10 MG tablet Take 1 tablet (10 mg total) by mouth daily. 90 tablet 0   atorvastatin (LIPITOR) 80 MG tablet Take 1 tablet (80 mg total) by mouth at bedtime. 30 tablet 0   Blood Glucose Monitoring Suppl (ACCU-CHEK GUIDE ME) w/Device KIT 1 Piece by Does not apply route as directed. 1 kit 0   cycloSPORINE (RESTASIS) 0.05 % ophthalmic emulsion Place 1 drop into both eyes 2 (two) times daily.     divalproex (DEPAKOTE) 500 MG DR tablet Take 2 tablets (1,000 mg total) by mouth 2 (two) times daily. 120 tablet 2   escitalopram (LEXAPRO) 20 MG tablet Take 20 mg by mouth daily.      glimepiride (AMARYL) 2 MG tablet Take 1 tablet (2 mg total) by mouth daily with breakfast. 90 tablet 1   glucose blood (ACCU-CHEK GUIDE) test strip Monitor glucose 2 times a day. 150 each 2   hydrochlorothiazide (HYDRODIURIL) 25 MG tablet Take 1 tablet (25 mg total) by mouth daily. 90 tablet 3   isosorbide mononitrate (IMDUR) 30 MG 24 hr tablet Take 1 tablet (30 mg total) by mouth daily. 90 tablet 3   lamoTRIgine (LAMICTAL) 25 MG tablet Take 50 mg by mouth 2 (two) times daily.     LANTUS SOLOSTAR 100 UNIT/ML Solostar Pen INJECT 40 UNITS INTO THE SKIN AT BEDTIME. 15 mL 0   Melatonin 10 MG CAPS Take 10 capsules by mouth at bedtime as needed (for sleep).     Multiple Vitamin (MULTIVITAMIN WITH MINERALS) TABS tablet Take 1 tablet by mouth  daily.     pantoprazole (PROTONIX) 40 MG tablet Take 1 tablet (40 mg total) by mouth daily. (Patient taking differently: Take 40 mg by mouth daily as needed.)     senna-docusate (SENOKOT-S) 8.6-50 MG tablet Take 1 tablet by mouth at bedtime as needed for mild constipation or moderate constipation.     ticagrelor (BRILINTA) 90 MG TABS tablet Take 1 tablet (90 mg total) by mouth 2 (two) times daily. 60 tablet 5   nitroGLYCERIN (NITROSTAT) 0.4 MG SL tablet Place 1 tablet (0.4 mg total) under the tongue every 5 (five) minutes as needed for chest pain. 25 tablet 3   No current facility-administered medications on file prior to visit.    Allergies:   Allergies  Allergen Reactions   Aspirin Swelling and Other (See Comments)    Facial swelling       OBJECTIVE:  Physical Exam  Vitals:   07/13/21 1234  BP: 110/73  Pulse: 75  Weight: 142 lb (64.4 kg)  Height: _0  (1.651 m)    Body mass index is 23.63 kg/m. No results found.  General: well developed, well nourished, pleasant middle-aged African-American male, seated, in no evident distress Head: head normocephalic and atraumatic.   Neck: supple with no carotid or supraclavicular bruits,  Cardiovascular: regular rate and rhythm, no murmurs Musculoskeletal: no deformity, neck tightness with rotation left and right with cervical paraspinal tenderness Skin:  no rash/petichiae Vascular:  Normal pulses all extremities   Neurologic Exam Mental Status: Awake and fully alert.  Fluent speech and language.  Oriented to place and time. Recent and remote memory impaired.  Attention span, concentration and fund of knowledge impaired.  Sister  provides majority of history and makes corrections to patients report.  Mood and affect appropriate.  Cranial Nerves: Pupils equal, briskly reactive to light. Extraocular movements full without nystagmus. Visual fields full to confrontation. Hearing intact. Facial sensation intact. Face, tongue, palate moves  normally and symmetrically.  Motor: Decreased right hand dexterity with some increased tone, no evidence of grip weakness, full strength proximal. Mild RLE hip flexor weakness although questionable full effort with increased back pain with resistance. Full strength LUE and LLE Sensory.: intact to touch , pinprick , position and vibratory sensation.  Coordination: Rapid alternating movements normal in all extremities except slightly decreased right hand dexterity. Finger-to-nose and heel-to-shin right-sided incoordination.  Mild tremor noted in all extremities with activation.  No evidence of resting tremor or cogwheel rigidity Gait and Station: Arises from chair without difficulty. Stance is normal. Gait demonstrates normal stride length and mild unsteadiness with slight favoring of right leg without use of AD.  Tandem walking heel toe not attempted Reflexes: 1+ and symmetric. Toes downgoing.         ASSESSMENT: Melvin Taylor is a 55 y.o. year old male with left MCA infarct on 12/16/2020 due to left M1 occlusion s/p IR with stenting secondary to ICAD after presenting with right-sided weakness and slurred speech.  Vascular risk factors include HTN, HLD, DM, CAD s/p CABG, hx of substance abuse, prior stroke on imaging (R occipital lobe), OSA and seizures on Depakote and Lamictal.      PLAN:  L MCA stroke:  Residual deficit: right sided weakness, ataxia, gait impairment and worsening baseline cognition. Mild RLE weakness not previously noted and decreased right hand strength. In setting of increased fatigue and continued headaches, recommend completed MR brain to rule out new stroke. Advised to hold on exercises for a couple of days to see if pain improves - concern related to overuse Recent f/u with Dr. Estanislado Pandy and repeat MRA yesterday - awaiting results Continue Brilinta (ticagrelor) 90 mg bid  and atorvastatin 80 mg daily for secondary stroke prevention.   Discussed secondary stroke  prevention measures and importance of close PCP follow up for aggressive stroke risk factor management. I have gone over the pathophysiology of stroke, warning signs and symptoms, risk factors and their management in some detail with instructions to go to the closest emergency room for symptoms of concern. HTN: BP goal <130/90.  Stable on current regimen per PCP HLD: LDL goal <70. On atorvastatin 80 mg daily per PCP - recent lab work completed by PCP (unable to view via epic) DMII: A1c goal<7.0.  Prior A1c 6.6 down from 9.7.  Continue to follow-up with PCP for close monitoring management Seizure disorder: On Depakote and Lamictal followed by Dr. Leta Baptist.  No additional seizures. He has been persistently taking medications since May - low suspicion contributing to fatigue Fatigue: Likely multifactorial. Plans on starting iron supplement and vitamin D as advised by PCP which may be contributing. Hx of OSA with CPAP noncompliance also likely contributing - declines interest of restarting. Encouraged to gradually increase day time activity Headache: chronic. Radiating from cervical right side. No evidence of occipital neuralgia on exam or per reported symptoms. Chronic cervical issues with prior procedure possibly contributing.  Recommend he follow-up with his prior provider that completed surgery for re-evaluation. Present of symptoms upon awakening can be related to untreated sleep apnea.  Discussed use of heat/ice and gentle ROM/stretching exercises. Limited medication options as current use of antidepressants and additional use may lower seizure threshold,  current use of seizure medications, and low BP not appropriate for beta-blockers.     Follow up in 4 months or call earlier if needed   CC:  [facility] provider: Dr. Leonie Man PCP: [name], FNP    I spent 38 minutes of face-to-face and non-face-to-face time with patient and sister (via cell phone).  This included previsit chart review, lab review,  study review, order entry, electronic health record documentation, patient and sister education regarding prior stroke and residual deficits, secondary stroke prevention measures and importance of managing stroke risk factors, seizure disorder and multiple other complaints as above and answered all other questions to patient and sisters satisfaction  [name], AGNP-BC  [facility] [address] [place], [place] [address]  Phone [phone] Fax [phone] Note: This document was prepared with digital dictation and possible smart phrase technology. Any transcriptional errors that result from this process are unintentional.

## 2021-07-14 ENCOUNTER — Telehealth (HOSPITAL_COMMUNITY): Payer: Self-pay

## 2021-07-14 ENCOUNTER — Ambulatory Visit (HOSPITAL_COMMUNITY): Payer: Medicaid Other

## 2021-07-14 ENCOUNTER — Telehealth: Payer: Self-pay | Admitting: Adult Health

## 2021-07-14 ENCOUNTER — Ambulatory Visit (HOSPITAL_COMMUNITY): Payer: Medicaid Other | Admitting: Speech Pathology

## 2021-07-14 NOTE — Progress Notes (Signed)
I agree with the above plan 

## 2021-07-14 NOTE — Telephone Encounter (Signed)
Called pt regarding recent imaging, no answer, left vm. AW  

## 2021-07-14 NOTE — Telephone Encounter (Signed)
medicaid order sent to [facility], [facility] they will reach out to the patient to schedule.

## 2021-07-19 ENCOUNTER — Telehealth (HOSPITAL_COMMUNITY): Payer: Self-pay

## 2021-07-19 NOTE — Telephone Encounter (Signed)
Returned sister's call, no answer, left vm. AW

## 2021-07-28 ENCOUNTER — Ambulatory Visit: Payer: Medicaid Other | Admitting: Podiatry

## 2021-08-05 ENCOUNTER — Other Ambulatory Visit: Payer: Self-pay

## 2021-08-05 ENCOUNTER — Telehealth: Payer: Self-pay | Admitting: "Endocrinology

## 2021-08-05 ENCOUNTER — Ambulatory Visit: Payer: Medicaid Other | Admitting: "Endocrinology

## 2021-08-05 ENCOUNTER — Emergency Department (HOSPITAL_COMMUNITY)
Admission: EM | Admit: 2021-08-05 | Discharge: 2021-08-05 | Disposition: A | Discharge status: Home / Self Care | Payer: Medicaid Other | Attending: Emergency Medicine | Admitting: Emergency Medicine

## 2021-08-05 ENCOUNTER — Encounter (HOSPITAL_COMMUNITY): Payer: Self-pay | Admitting: Emergency Medicine

## 2021-08-05 DIAGNOSIS — R519 Headache, unspecified: Secondary | ICD-10-CM | POA: Insufficient documentation

## 2021-08-05 DIAGNOSIS — R531 Weakness: Secondary | ICD-10-CM | POA: Insufficient documentation

## 2021-08-05 DIAGNOSIS — Z5321 Procedure and treatment not carried out due to patient leaving prior to being seen by health care provider: Secondary | ICD-10-CM | POA: Insufficient documentation

## 2021-08-05 DIAGNOSIS — E162 Hypoglycemia, unspecified: Secondary | ICD-10-CM | POA: Insufficient documentation

## 2021-08-05 LAB — BASIC METABOLIC PANEL
Anion gap: 8 (ref 5–15)
BUN: 25 mg/dL — ABNORMAL HIGH (ref 6–20)
CO2: 26 mmol/L (ref 22–32)
Calcium: 9.3 mg/dL (ref 8.9–10.3)
Chloride: 107 mmol/L (ref 98–111)
Creatinine, Ser: 2.32 mg/dL — ABNORMAL HIGH (ref 0.61–1.24)
GFR, Estimated: 32 mL/min — ABNORMAL LOW (ref >60)
Glucose, Bld: 100 mg/dL — ABNORMAL HIGH (ref 70–99)
Potassium: 4.5 mmol/L (ref 3.5–5.1)
Sodium: 141 mmol/L (ref 135–145)

## 2021-08-05 LAB — CBC
HCT: 28.6 % — ABNORMAL LOW (ref 39.0–52.0)
Hemoglobin: 9.3 g/dL — ABNORMAL LOW (ref 13.0–17.0)
MCH: 30.5 pg (ref 26.0–34.0)
MCHC: 32.5 g/dL (ref 30.0–36.0)
MCV: 93.8 fL (ref 80.0–100.0)
Platelets: 199 10*3/uL (ref 150–400)
RBC: 3.05 MIL/uL — ABNORMAL LOW (ref 4.22–5.81)
RDW: 14.7 % (ref 11.5–15.5)
WBC: 3.8 10*3/uL — ABNORMAL LOW (ref 4.0–10.5)
nRBC: 0 % (ref 0.0–0.2)

## 2021-08-05 LAB — CBG MONITORING, ED: Glucose-Capillary: 124 mg/dL — ABNORMAL HIGH (ref 70–99)

## 2021-08-05 NOTE — Telephone Encounter (Signed)
Patient's sugar was 203 but he has not been taking his lantus at night because it has been fine. He has been to the ER 2x since 12/30. His sister will update as she can.

## 2021-08-05 NOTE — ED Triage Notes (Signed)
Pt presents with caretaker for ?stroke sx. Reports HA x 3 days. Was seen at [facility] on Friday for hypoglycemia and weakness. CBG 124 in triage.

## 2021-08-09 ENCOUNTER — Encounter: Payer: Self-pay | Admitting: Podiatry

## 2021-08-09 ENCOUNTER — Ambulatory Visit (INDEPENDENT_AMBULATORY_CARE_PROVIDER_SITE_OTHER): Payer: Medicaid Other | Admitting: Podiatry

## 2021-08-09 ENCOUNTER — Other Ambulatory Visit: Payer: Self-pay

## 2021-08-09 DIAGNOSIS — B351 Tinea unguium: Secondary | ICD-10-CM | POA: Diagnosis not present

## 2021-08-09 DIAGNOSIS — M79674 Pain in right toe(s): Secondary | ICD-10-CM | POA: Diagnosis not present

## 2021-08-09 DIAGNOSIS — M79675 Pain in left toe(s): Secondary | ICD-10-CM

## 2021-08-09 DIAGNOSIS — E1142 Type 2 diabetes mellitus with diabetic polyneuropathy: Secondary | ICD-10-CM | POA: Diagnosis not present

## 2021-08-09 NOTE — Progress Notes (Signed)
This patient returns to my office for at risk foot care.  This patient requires this care by a professional since this patient will be at risk due to having diabetic neuropathy.  This patient is unable to cut nails himself since the patient cannot reach his nails.These nails are painful walking and wearing shoes.  This patient presents for at risk foot care today.  General Appearance  Alert, conversant and in no acute stress.  Vascular  Dorsalis pedis and posterior tibial  pulses are palpable  bilaterally.  Capillary return is within normal limits  bilaterally. Temperature is within normal limits  bilaterally.  Neurologic  Senn-Weinstein monofilament wire test within normal limits  bilaterally. Muscle power within normal limits bilaterally.  Nails Thick disfigured discolored nails with subungual debris  from hallux to fifth toes bilaterally. No evidence of bacterial infection or drainage bilaterally.  Orthopedic  No limitations of motion  feet .  No crepitus or effusions noted.  No bony pathology or digital deformities noted.  Skin  normotropic skin with no porokeratosis noted bilaterally.  No signs of infections or ulcers noted.     Onychomycosis  Pain in right toes  Pain in left toes  Consent was obtained for treatment procedures.   Mechanical debridement of nails 1-5  bilaterally performed with a nail nipper.  Filed with dremel without incident.    Return office visit   3 months                   Told patient to return for periodic foot care and evaluation due to potential at risk complications.   Tylisa Alcivar DPM   

## 2021-08-10 ENCOUNTER — Encounter: Payer: Self-pay | Admitting: Orthopedic Surgery

## 2021-08-11 ENCOUNTER — Other Ambulatory Visit: Payer: Self-pay

## 2021-08-11 ENCOUNTER — Ambulatory Visit
Admission: RE | Admit: 2021-08-11 | Discharge: 2021-08-11 | Disposition: A | Discharge status: Home / Self Care | Payer: Medicaid Other | Source: Ambulatory Visit | Attending: Adult Health | Admitting: Adult Health

## 2021-08-11 DIAGNOSIS — I63512 Cerebral infarction due to unspecified occlusion or stenosis of left middle cerebral artery: Secondary | ICD-10-CM

## 2021-08-11 DIAGNOSIS — G4486 Cervicogenic headache: Secondary | ICD-10-CM

## 2021-08-17 ENCOUNTER — Other Ambulatory Visit (HOSPITAL_COMMUNITY): Payer: Self-pay | Admitting: Nurse Practitioner

## 2021-08-17 ENCOUNTER — Other Ambulatory Visit: Payer: Self-pay

## 2021-08-17 ENCOUNTER — Ambulatory Visit (HOSPITAL_COMMUNITY)
Admission: RE | Admit: 2021-08-17 | Discharge: 2021-08-17 | Disposition: A | Discharge status: Home / Self Care | Payer: Medicaid Other | Source: Ambulatory Visit | Attending: Nurse Practitioner | Admitting: Nurse Practitioner

## 2021-08-17 DIAGNOSIS — R4182 Altered mental status, unspecified: Secondary | ICD-10-CM | POA: Diagnosis present

## 2021-08-18 ENCOUNTER — Telehealth: Payer: Self-pay | Admitting: Adult Health

## 2021-08-18 NOTE — Telephone Encounter (Signed)
Contact sister informed her of results, she was appreciative.

## 2021-08-18 NOTE — Telephone Encounter (Signed)
[name] is asking for a call with results to pt's MRI when available.

## 2021-08-30 ENCOUNTER — Other Ambulatory Visit (HOSPITAL_COMMUNITY): Payer: Self-pay | Admitting: Nephrology

## 2021-08-30 ENCOUNTER — Other Ambulatory Visit: Payer: Self-pay | Admitting: Nephrology

## 2021-08-30 DIAGNOSIS — N170 Acute kidney failure with tubular necrosis: Secondary | ICD-10-CM

## 2021-08-30 DIAGNOSIS — I129 Hypertensive chronic kidney disease with stage 1 through stage 4 chronic kidney disease, or unspecified chronic kidney disease: Secondary | ICD-10-CM

## 2021-08-30 DIAGNOSIS — E1122 Type 2 diabetes mellitus with diabetic chronic kidney disease: Secondary | ICD-10-CM

## 2021-08-30 DIAGNOSIS — D638 Anemia in other chronic diseases classified elsewhere: Secondary | ICD-10-CM

## 2021-08-31 ENCOUNTER — Other Ambulatory Visit: Payer: Self-pay | Admitting: "Endocrinology

## 2021-09-02 ENCOUNTER — Encounter: Payer: Self-pay | Admitting: Orthopedic Surgery

## 2021-09-02 ENCOUNTER — Ambulatory Visit: Payer: Medicaid Other | Admitting: Orthopedic Surgery

## 2021-09-02 ENCOUNTER — Other Ambulatory Visit: Payer: Self-pay

## 2021-09-02 VITALS — BP 108/70 | HR 75 | Ht 66.0 in | Wt 126.8 lb

## 2021-09-02 DIAGNOSIS — M65332 Trigger finger, left middle finger: Secondary | ICD-10-CM | POA: Diagnosis not present

## 2021-09-02 NOTE — Progress Notes (Signed)
FOLLOW UP   Encounter Diagnosis  Name Primary?   Trigger finger, left middle finger Yes     Chief Complaint  Patient presents with   Hand Pain    LT long finger Painful x 2 weeks     Melvin Taylor is 56 years old he had a stroke affecting his right side presents with catching locking left long finger seems to come and go  System review no fever no chills no numbness no tingling  Past Medical History:  Diagnosis Date   Anxiety    Bipolar 1 disorder (HCC)    Bone spur    left heel   Cervical radiculopathy    Chest pain    Chronic back pain    Chronic chest wall pain    Chronic neck pain    Coronary artery disease    a. s/p CABG in 03/2016 with LIMA-LAD, SVG-D1, SVG-RCA, and Seq SVG-mid and distal OM   Depression    Diabetes mellitus    Type II   Hallucinations    "long history of them"   Headache(784.0)    History of gout    HTN (hypertension)    Insomnia    Myocardial infarction ([place]) 2017   Neuropathy    Pain management    Polysubstance abuse ([place])    Right leg pain    chronic   Schizophrenia ([place])    Seizures ([place])    last sz between July 5-9th, 2016; epilepsy   Shortness of breath dyspnea    with exertion   Sleep apnea    Transfusion of blood product refused for religious reason    BP 108/70    Pulse 75    Ht 5\' 6"  (1.676 m)    Wt 126 lb 12.8 oz (57.5 kg)    BMI 20.47 kg/m   Physical Exam Constitutional:      General: He is not in acute distress.    Appearance: He is well-developed.     Comments: Well developed, well nourished Normal grooming and hygiene     Cardiovascular:     Comments: No peripheral edema Musculoskeletal:     Comments: Left long or middle finger has in flexion it was easily manually reduced with tenderness over the A1 pulley neurovascular exam is intact color and capillary refill are normal flexor tendon strength is intact as well  Skin:    General: Skin is warm and dry.  Neurological:     Mental Status: He is alert and  oriented to person, place, and time.     Sensory: No sensory deficit.     Coordination: Coordination normal.     Gait: Gait normal.     Deep Tendon Reflexes: Reflexes are normal and symmetric.  Psychiatric:        Mood and Affect: Mood normal.        Behavior: Behavior normal.     Comments: Affect normal    Trigger finger injection  Diagnosis  left long finger or middle finger  Procedure injection A1 pulley Medications lidocaine 1% 1 mL and Depo-Medrol 40 mg 1 mL Skin prep alcohol and ethyl chloride Verbal consent was obtained Timeout confirmed the injection site  After cleaning the skin with alcohol and anesthetizing the skin with ethyl chloride the A1 pulley was palpated and the injection was performed without complication

## 2021-09-06 ENCOUNTER — Other Ambulatory Visit: Payer: Self-pay

## 2021-09-06 ENCOUNTER — Ambulatory Visit (HOSPITAL_COMMUNITY)
Admission: RE | Admit: 2021-09-06 | Discharge: 2021-09-06 | Disposition: A | Discharge status: Home / Self Care | Payer: Medicaid Other | Source: Ambulatory Visit | Attending: Nephrology | Admitting: Nephrology

## 2021-09-06 DIAGNOSIS — D638 Anemia in other chronic diseases classified elsewhere: Secondary | ICD-10-CM | POA: Insufficient documentation

## 2021-09-06 DIAGNOSIS — I129 Hypertensive chronic kidney disease with stage 1 through stage 4 chronic kidney disease, or unspecified chronic kidney disease: Secondary | ICD-10-CM | POA: Diagnosis present

## 2021-09-06 DIAGNOSIS — E1122 Type 2 diabetes mellitus with diabetic chronic kidney disease: Secondary | ICD-10-CM | POA: Diagnosis present

## 2021-09-06 DIAGNOSIS — N170 Acute kidney failure with tubular necrosis: Secondary | ICD-10-CM | POA: Insufficient documentation

## 2021-09-09 ENCOUNTER — Other Ambulatory Visit: Payer: Self-pay | Admitting: "Endocrinology

## 2021-09-13 ENCOUNTER — Encounter: Payer: Self-pay | Admitting: *Deleted

## 2021-09-13 ENCOUNTER — Telehealth: Payer: Self-pay | Admitting: *Deleted

## 2021-09-13 NOTE — Telephone Encounter (Signed)
Received fax from [facility] re: depakote dose. Last Rx was divalproex 500 mg, taker 2 tabs twice daily. Patient told pharmacy he only takes one tab twice daily.   I called, spoke with sister Melvin Taylor) , [place], on [place] who stated he wasn't taking meds at all. They hired a caregiver who comes 7 days a week and manages his meds now. Melvin Haw stated he was sleeping al the time so she tried to figure out what med may be causing it. Last Aug she discussed decreasing dose with patient who told her he didn't want to take med at all. She decided to cut back on his divalproex to one tab twice daily, stated he has not had any seizures at all. I advised will let Dr Leta Baptist know and call her if he has other recommendations. I noted he never follow up in a year with NP. We scheduled VV, but she will have to check with caregiver to see if she can help patient with VV. If not Melvin Haw will call back and schedule in office visit. She lives out of town. She  verbalized understanding, appreciation.

## 2021-09-21 ENCOUNTER — Telehealth: Payer: Self-pay | Admitting: Radiology

## 2021-09-21 NOTE — Telephone Encounter (Signed)
Patient's sister called, said that the injection has not helped.  Patient is in a lot of pain, still locking.  What do you recommend next?  Please call.  Thanks.

## 2021-09-21 NOTE — Telephone Encounter (Signed)
Dr Aline Brochure said Return if symptoms worsen or fail to improve.I called sister to advise.  She will make appointment

## 2021-10-04 ENCOUNTER — Ambulatory Visit (INDEPENDENT_AMBULATORY_CARE_PROVIDER_SITE_OTHER): Payer: Medicaid Other | Admitting: Orthopedic Surgery

## 2021-10-04 ENCOUNTER — Other Ambulatory Visit: Payer: Self-pay

## 2021-10-04 ENCOUNTER — Encounter: Payer: Self-pay | Admitting: Orthopedic Surgery

## 2021-10-04 ENCOUNTER — Telehealth: Payer: Self-pay

## 2021-10-04 DIAGNOSIS — M65332 Trigger finger, left middle finger: Secondary | ICD-10-CM | POA: Diagnosis not present

## 2021-10-04 NOTE — Telephone Encounter (Signed)
Called and LVM. Per Amy, NP pt f/u with Janett Billow, NP and needs to f/u with her instead.  ? ?If pt calls back or sister [name] on [place] please sch pt for a 4 month f/u around 4/13 with Janett Billow, NP can be VV.  ?

## 2021-10-04 NOTE — Progress Notes (Signed)
Chief Complaint  ?Patient presents with  ? Hand Pain  ?  Left middle trigger finger  ? ? ?Encounter Diagnosis  ?Name Primary?  ? Trigger finger, left middle finger Yes  ? ? ?Melvin Taylor is 56 years old about a month ago he had an injection in his left long/middle finger he comes in today complaining of similar symptoms with catching and locking of the left long finger and would like to proceed with surgery ? ?His exam today shows that his finger is indeed locked I pulled it free he has tenderness over the A1 pulley both flexor tendons are intact there is no evidence of neurovascular compromise to the digit ? ?We will schedule his surgery for March 17 ? ?I will do a separate history and physical and it is incorporated by reference ? ?As far as the surgery goes we will do a left middle or long finger trigger finger release and his risk factors are mainly diabetes ?

## 2021-10-05 ENCOUNTER — Telehealth: Payer: Medicaid Other | Admitting: Family Medicine

## 2021-10-07 ENCOUNTER — Other Ambulatory Visit: Payer: Self-pay | Admitting: "Endocrinology

## 2021-10-07 ENCOUNTER — Other Ambulatory Visit: Payer: Self-pay

## 2021-10-07 DIAGNOSIS — M65332 Trigger finger, left middle finger: Secondary | ICD-10-CM

## 2021-10-11 NOTE — Patient Instructions (Addendum)
Melvin Taylor  10/11/2021     '@PREFPERIOPPHARMACY'$ @   Your procedure is scheduled on  10/15/2021.   Report to [facility] at  1000 A.M.   Call this number if you have problems the morning of surgery:  [phone]   Remember:  Do not eat or drink after midnight.    Use your inhaler before you come and bring your rescue inhaler with you.  Take 20 units of your lantus the night before your procedure.  DO NOT take nay medications for diabetes the morning of your procedure.     Take these medicines the morning of surgery with A SIP OF WATER   amlodipine, depakote, lexapro, imdur, lamicatal, protonix.     Do not wear jewelry, make-up or nail polish.  Do not wear lotions, powders, or perfumes, or deodorant.  Do not shave 48 hours prior to surgery.  Men may shave face and neck.  Do not bring valuables to the hospital.  [facility] is not responsible for any belongings or valuables.  Contacts, dentures or bridgework may not be worn into surgery.  Leave your suitcase in the car.  After surgery it may be brought to your room.  For patients admitted to the hospital, discharge time will be determined by your treatment team.  Patients discharged the day of surgery will not be allowed to drive home and must have someone with them for 24 hours.    Special instructions:   DO NOT smoke tobacco or vape for 24 hours before your procedure.  Please read over the following fact sheets that you were given. Coughing and Deep Breathing, Surgical Site Infection Prevention, Anesthesia Post-op Instructions, and Care and Recovery After Surgery       Incision Care, Adult An incision is a cut that a doctor makes in your skin for surgery. Most times, these cuts are closed after surgery. Your cut from surgery may be closed with: Stitches (sutures). Staples. Skin glue. Skin tape (adhesive) strips. You may need to go back to your doctor to have stitches or staples taken out. This  may happen many days or many weeks after your surgery. You need to take good care of your cut so it does not get infected. Follow instructions from your doctor about how to care for your cut. Supplies needed: Soap and water. A clean hand towel. Wound cleanser. A clean bandage (dressing), if needed. Cream or ointment, if told by your doctor. Clean gauze. How to care for your cut from surgery Cleaning your cut Ask your doctor how to clean your cut. You may need to: Wear medical gloves. Use mild soap and water, or a wound cleanser. Use a clean gauze to pat your cut dry after you clean it. Changing your bandage Wash your hands with soap and water for at least 20 seconds before and after you change your bandage. If you cannot use soap and water, use hand sanitizer. Do not usedisinfectants or antiseptics, such as rubbing alcohol, to clean your wound unless told by your doctor. Change your bandage as told by your doctor. Leavestitches or skin glue in place for at least 2 weeks. Leave tape strips alone unless you are told to take them off. You may trim the edges of the tape strips if they curl up. Put a cream or ointment on your cut. Do this only as told. Cover your cut with a clean bandage. Ask your doctor when you can leave your  cut uncovered. Checking for infection Check your cut area every day for signs of infection. Check for: More redness, swelling, or pain. More fluid or blood. New warmth. Hardness or a new rash around the incision. Pus or a bad smell.  Follow these instructions at home Medicines Take over-the-counter and prescription medicines only as told by your doctor. If you were prescribed an antibiotic medicine, cream, or ointment, use it as told by your doctor. Do not stop using the antibiotic even if you start to feel better. Eating and drinking Eat foods that have a lot of certain nutrients, such as protein, vitamin A, and vitamin C. These foods help your cut heal. Foods  rich in protein include meat, fish, eggs, dairy, beans, nuts, and protein drinks. Foods rich in vitamin A include carrots and dark green, leafy vegetables. Foods rich in vitamin C include citrus fruits, tomatoes, broccoli, and peppers. Drink enough fluid to keep your pee (urine) pale yellow. General instructions  Do not take baths, swim, or use a hot tub. Ask your doctor about taking showers or sponge baths. Limit movement around your cut. This helps with healing. Try not to strain, lift, or exercise for the first 2 weeks, or for as long as told by your doctor. Return to your normal activities as told by your doctor. Ask your doctor what activities are safe for you. Do not scratch, scrub, or pick at your cut. Keep it covered as told by your doctor. Protect your cut from the sun when you are outside for the first 6 months, or for as long as told by your doctor. Cover up the scar area or put on sunscreen that has an SPF of at least 30. Do not use any products that contain nicotine or tobacco, such as cigarettes, e-cigarettes, and chewing tobacco. These can delay cut healing. If you need help quitting, ask your doctor. Keep all follow-up visits. Contact a doctor if: You have any of these signs of infection around your cut: More redness, swelling, or pain. More fluid or blood. New warmth or hardness. Pus or a bad smell. A new rash. You have a fever. You feel like you may vomit (nauseous). You vomit. You are dizzy. Your stitches, staples, skin glue, or tape strips come undone. Your cut gets bigger. You have a fever. Get help right away if: Your cut bleeds through your bandage, and bleeding does not stop with gentle pressure. Your cut opens up and comes apart. These symptoms may be an emergency. Do not wait to see if the symptoms will go away. Get medical help right away. Call your local emergency services (911 in the [place] Do not drive yourself to the hospital. Summary Follow  instructions from your doctor about how to care for your cut. Wash your hands with soap and water for at least 20 seconds before and after you change your bandage. If you cannot use soap and water, use hand sanitizer. Check your cut area every day for signs of infection. Keep all follow-up visits. This information is not intended to replace advice given to you by your health care provider. Make sure you discuss any questions you have with your health care provider. Document Revised: 10/19/2020 Document Reviewed: 10/19/2020 Elsevier Patient Education  [place] Anesthesia, Adult, Care After This sheet gives you information about how to care for yourself after your procedure. Your health care provider may also give you more specific instructions. If you have problems or questions, contact your health care  provider. What can I expect after the procedure? After the procedure, the following side effects are common: Pain or discomfort at the IV site. Nausea. Vomiting. Sore throat. Trouble concentrating. Feeling cold or chills. Feeling weak or tired. Sleepiness and fatigue. Soreness and body aches. These side effects can affect parts of the body that were not involved in surgery. Follow these instructions at home: For the time period you were told by your health care provider:  Rest. Do not participate in activities where you could fall or become injured. Do not drive or use machinery. Do not drink alcohol. Do not take sleeping pills or medicines that cause drowsiness. Do not make important decisions or sign legal documents. Do not take care of children on your own. Eating and drinking Follow any instructions from your health care provider about eating or drinking restrictions. When you feel hungry, start by eating small amounts of foods that are soft and easy to digest (bland), such as toast. Gradually return to your regular diet. Drink enough fluid to keep your urine  pale yellow. If you vomit, rehydrate by drinking water, juice, or clear broth. General instructions If you have sleep apnea, surgery and certain medicines can increase your risk for breathing problems. Follow instructions from your health care provider about wearing your sleep device: Anytime you are sleeping, including during daytime naps. While taking prescription pain medicines, sleeping medicines, or medicines that make you drowsy. Have a responsible adult stay with you for the time you are told. It is important to have someone help care for you until you are awake and alert. Return to your normal activities as told by your health care provider. Ask your health care provider what activities are safe for you. Take over-the-counter and prescription medicines only as told by your health care provider. If you smoke, do not smoke without supervision. Keep all follow-up visits as told by your health care provider. This is important. Contact a health care provider if: You have nausea or vomiting that does not get better with medicine. You cannot eat or drink without vomiting. You have pain that does not get better with medicine. You are unable to pass urine. You develop a skin rash. You have a fever. You have redness around your IV site that gets worse. Get help right away if: You have difficulty breathing. You have chest pain. You have blood in your urine or stool, or you vomit blood. Summary After the procedure, it is common to have a sore throat or nausea. It is also common to feel tired. Have a responsible adult stay with you for the time you are told. It is important to have someone help care for you until you are awake and alert. When you feel hungry, start by eating small amounts of foods that are soft and easy to digest (bland), such as toast. Gradually return to your regular diet. Drink enough fluid to keep your urine pale yellow. Return to your normal activities as told by your  health care provider. Ask your health care provider what activities are safe for you. This information is not intended to replace advice given to you by your health care provider. Make sure you discuss any questions you have with your health care provider. Document Revised: 04/02/2020 Document Reviewed: 10/31/2019 Elsevier Patient Education  2022 [place]. How to Use Chlorhexidine for Bathing Chlorhexidine gluconate (CHG) is a germ-killing (antiseptic) solution that is used to clean the skin. It can get rid of the bacteria that normally  live on the skin and can keep them away for about 24 hours. To clean your skin with CHG, you may be given: A CHG solution to use in the shower or as part of a sponge bath. A prepackaged cloth that contains CHG. Cleaning your skin with CHG may help lower the risk for infection: While you are staying in the intensive care unit of the hospital. If you have a vascular access, such as a central line, to provide short-term or long-term access to your veins. If you have a catheter to drain urine from your bladder. If you are on a ventilator. A ventilator is a machine that helps you breathe by moving air in and out of your lungs. After surgery. What are the risks? Risks of using CHG include: A skin reaction. Hearing loss, if CHG gets in your ears and you have a perforated eardrum. Eye injury, if CHG gets in your eyes and is not rinsed out. The CHG product catching fire. Make sure that you avoid smoking and flames after applying CHG to your skin. Do not use CHG: If you have a chlorhexidine allergy or have previously reacted to chlorhexidine. On babies younger than 74 months of age. How to use CHG solution Use CHG only as told by your health care provider, and follow the instructions on the label. Use the full amount of CHG as directed. Usually, this is one bottle. During a shower Follow these steps when using CHG solution during a shower (unless your health  care provider gives you different instructions): Start the shower. Use your normal soap and shampoo to wash your face and hair. Turn off the shower or move out of the shower stream. Pour the CHG onto a clean washcloth. Do not use any type of brush or rough-edged sponge. Starting at your neck, lather your body down to your toes. Make sure you follow these instructions: If you will be having surgery, pay special attention to the part of your body where you will be having surgery. Scrub this area for at least 1 minute. Do not use CHG on your head or face. If the solution gets into your ears or eyes, rinse them well with water. Avoid your genital area. Avoid any areas of skin that have broken skin, cuts, or scrapes. Scrub your back and under your arms. Make sure to wash skin folds. Let the lather sit on your skin for 1-2 minutes or as long as told by your health care provider. Thoroughly rinse your entire body in the shower. Make sure that all body creases and crevices are rinsed well. Dry off with a clean towel. Do not put any substances on your body afterward--such as powder, lotion, or perfume--unless you are told to do so by your health care provider. Only use lotions that are recommended by the manufacturer. Put on clean clothes or pajamas. If it is the night before your surgery, sleep in clean sheets.  During a sponge bath Follow these steps when using CHG solution during a sponge bath (unless your health care provider gives you different instructions): Use your normal soap and shampoo to wash your face and hair. Pour the CHG onto a clean washcloth. Starting at your neck, lather your body down to your toes. Make sure you follow these instructions: If you will be having surgery, pay special attention to the part of your body where you will be having surgery. Scrub this area for at least 1 minute. Do not use CHG on your head  or face. If the solution gets into your ears or eyes, rinse them well  with water. Avoid your genital area. Avoid any areas of skin that have broken skin, cuts, or scrapes. Scrub your back and under your arms. Make sure to wash skin folds. Let the lather sit on your skin for 1-2 minutes or as long as told by your health care provider. Using a different clean, wet washcloth, thoroughly rinse your entire body. Make sure that all body creases and crevices are rinsed well. Dry off with a clean towel. Do not put any substances on your body afterward--such as powder, lotion, or perfume--unless you are told to do so by your health care provider. Only use lotions that are recommended by the manufacturer. Put on clean clothes or pajamas. If it is the night before your surgery, sleep in clean sheets. How to use CHG prepackaged cloths Only use CHG cloths as told by your health care provider, and follow the instructions on the label. Use the CHG cloth on clean, dry skin. Do not use the CHG cloth on your head or face unless your health care provider tells you to. When washing with the CHG cloth: Avoid your genital area. Avoid any areas of skin that have broken skin, cuts, or scrapes. Before surgery Follow these steps when using a CHG cloth to clean before surgery (unless your health care provider gives you different instructions): Using the CHG cloth, vigorously scrub the part of your body where you will be having surgery. Scrub using a back-and-forth motion for 3 minutes. The area on your body should be completely wet with CHG when you are done scrubbing. Do not rinse. Discard the cloth and let the area air-dry. Do not put any substances on the area afterward, such as powder, lotion, or perfume. Put on clean clothes or pajamas. If it is the night before your surgery, sleep in clean sheets.  For general bathing Follow these steps when using CHG cloths for general bathing (unless your health care provider gives you different instructions). Use a separate CHG cloth for each  area of your body. Make sure you wash between any folds of skin and between your fingers and toes. Wash your body in the following order, switching to a new cloth after each step: The front of your neck, shoulders, and chest. Both of your arms, under your arms, and your hands. Your stomach and groin area, avoiding the genitals. Your right leg and foot. Your left leg and foot. The back of your neck, your back, and your buttocks. Do not rinse. Discard the cloth and let the area air-dry. Do not put any substances on your body afterward--such as powder, lotion, or perfume--unless you are told to do so by your health care provider. Only use lotions that are recommended by the manufacturer. Put on clean clothes or pajamas. Contact a health care provider if: Your skin gets irritated after scrubbing. You have questions about using your solution or cloth. You swallow any chlorhexidine. Call your local poison control center (1-253-294-5290 in the [place] Get help right away if: Your eyes itch badly, or they become very red or swollen. Your skin itches badly and is red or swollen. Your hearing changes. You have trouble seeing. You have swelling or tingling in your mouth or throat. You have trouble breathing. These symptoms may represent a serious problem that is an emergency. Do not wait to see if the symptoms will go away. Get medical help right away. Call your local  emergency services (911 in the [place] Do not drive yourself to the hospital. Summary Chlorhexidine gluconate (CHG) is a germ-killing (antiseptic) solution that is used to clean the skin. Cleaning your skin with CHG may help to lower your risk for infection. You may be given CHG to use for bathing. It may be in a bottle or in a prepackaged cloth to use on your skin. Carefully follow your health care provider's instructions and the instructions on the product label. Do not use CHG if you have a chlorhexidine allergy. Contact your health care  provider if your skin gets irritated after scrubbing. This information is not intended to replace advice given to you by your health care provider. Make sure you discuss any questions you have with your health care provider. Document Revised: 09/28/2020 Document Reviewed: 09/28/2020 Elsevier Patient Education  2022 [employer].

## 2021-10-13 ENCOUNTER — Encounter (HOSPITAL_COMMUNITY): Payer: Self-pay

## 2021-10-13 ENCOUNTER — Encounter (HOSPITAL_COMMUNITY)
Admission: RE | Admit: 2021-10-13 | Discharge: 2021-10-13 | Disposition: A | Discharge status: Home / Self Care | Payer: Medicaid Other | Source: Ambulatory Visit | Attending: Orthopedic Surgery | Admitting: Orthopedic Surgery

## 2021-10-13 VITALS — BP 135/88 | HR 61 | Temp 98.3°F | Resp 18 | Ht 66.0 in | Wt 131.0 lb

## 2021-10-13 DIAGNOSIS — Z01812 Encounter for preprocedural laboratory examination: Secondary | ICD-10-CM | POA: Insufficient documentation

## 2021-10-13 DIAGNOSIS — D638 Anemia in other chronic diseases classified elsewhere: Secondary | ICD-10-CM | POA: Diagnosis not present

## 2021-10-13 DIAGNOSIS — E1159 Type 2 diabetes mellitus with other circulatory complications: Secondary | ICD-10-CM

## 2021-10-13 HISTORY — DX: Cerebral infarction, unspecified: I63.9

## 2021-10-13 LAB — BASIC METABOLIC PANEL
Anion gap: 6 (ref 5–15)
BUN: 30 mg/dL — ABNORMAL HIGH (ref 6–20)
CO2: 26 mmol/L (ref 22–32)
Calcium: 8.6 mg/dL — ABNORMAL LOW (ref 8.9–10.3)
Chloride: 106 mmol/L (ref 98–111)
Creatinine, Ser: 1.85 mg/dL — ABNORMAL HIGH (ref 0.61–1.24)
GFR, Estimated: 42 mL/min — ABNORMAL LOW (ref >60)
Glucose, Bld: 158 mg/dL — ABNORMAL HIGH (ref 70–99)
Potassium: 4.3 mmol/L (ref 3.5–5.1)
Sodium: 138 mmol/L (ref 135–145)

## 2021-10-13 LAB — CBC WITH DIFFERENTIAL/PLATELET
Abs Immature Granulocytes: 0 10*3/uL (ref 0.00–0.07)
Basophils Absolute: 0 10*3/uL (ref 0.0–0.1)
Basophils Relative: 0 %
Eosinophils Absolute: 0.1 10*3/uL (ref 0.0–0.5)
Eosinophils Relative: 3 %
HCT: 28.6 % — ABNORMAL LOW (ref 39.0–52.0)
Hemoglobin: 9.4 g/dL — ABNORMAL LOW (ref 13.0–17.0)
Immature Granulocytes: 0 %
Lymphocytes Relative: 59 %
Lymphs Abs: 2.5 10*3/uL (ref 0.7–4.0)
MCH: 30.9 pg (ref 26.0–34.0)
MCHC: 32.9 g/dL (ref 30.0–36.0)
MCV: 94.1 fL (ref 80.0–100.0)
Monocytes Absolute: 0.4 10*3/uL (ref 0.1–1.0)
Monocytes Relative: 10 %
Neutro Abs: 1.2 10*3/uL — ABNORMAL LOW (ref 1.7–7.7)
Neutrophils Relative %: 28 %
Platelets: 165 10*3/uL (ref 150–400)
RBC: 3.04 MIL/uL — ABNORMAL LOW (ref 4.22–5.81)
RDW: 15.3 % (ref 11.5–15.5)
WBC: 4.2 10*3/uL (ref 4.0–10.5)
nRBC: 0 % (ref 0.0–0.2)

## 2021-10-14 LAB — HEMOGLOBIN A1C
Hgb A1c MFr Bld: 5.2 % (ref 4.8–5.6)
Mean Plasma Glucose: 103 mg/dL

## 2021-10-14 NOTE — H&P (Signed)
Outpatient H/P ? ?CC left long finger pain  ? ?HPI  ?[name] is 56 years old about a month ago he had an injection in his left long/middle finger he comes in today complaining of similar symptoms with catching and locking of the left long finger and would like to proceed with surgery ? ?ROS nothing at this time  ? ?Past Medical History:  ?Diagnosis Date  ? Anxiety   ? Bipolar 1 disorder ([place])   ? Bone spur   ? left heel  ? Cervical radiculopathy   ? Chest pain   ? Chronic back pain   ? Chronic chest wall pain   ? Chronic neck pain   ? Coronary artery disease   ? a. s/p CABG in 03/2016 with LIMA-LAD, SVG-D1, SVG-RCA, and Seq SVG-mid and distal OM  ? Depression   ? Diabetes mellitus   ? Type II  ? Hallucinations   ? "long history of them"  ? Headache(784.0)   ? History of gout   ? HTN (hypertension)   ? Insomnia   ? Myocardial infarction [facility]) 2017  ? Neuropathy   ? Pain management   ? Polysubstance abuse ([place])   ? Right leg pain   ? chronic  ? Schizophrenia ([place])   ? Seizures ([place])   ? last sz between July 5-9th, 2016; epilepsy  ? Shortness of breath dyspnea   ? with exertion  ? Sleep apnea   ? Stroke [facility])   ? Transfusion of blood product refused for religious reason   ? ?Past Surgical History:  ?Procedure Laterality Date  ? ANTERIOR CERVICAL DECOMP/DISCECTOMY FUSION N/A 12/14/2015  ? Procedure: ANTERIOR CERVICAL DECOMPRESSION/DISCECTOMY FUSION CERVICAL FIVE -SIX;  Surgeon: Earnie Larsson, MD;  Location: [place] NEURO ORS;  Service: Neurosurgery;  Laterality: N/A;  ? BIOPSY  05/07/2015  ? Procedure: BIOPSY (Gastric);  Surgeon: [name], MD;  Location: [facility];  Service: Endoscopy;;  ? CARDIAC CATHETERIZATION N/A 03/07/2016  ? Procedure: Left Heart Cath and Coronary Angiography;  Surgeon: Belva Crome, MD;  Location: [place] CV LAB;  Service: Cardiovascular;  Laterality: N/A;  ? COLONOSCOPY WITH PROPOFOL N/A 05/07/2015  ? NGE:[id number] diverticulosis, multiple colon polyps removed, tubular adenoma, serrated colon  polyp. Next colonoscopy October 2019  ? COLONOSCOPY WITH PROPOFOL N/A 08/02/2018  ? Procedure: COLONOSCOPY WITH PROPOFOL;  Surgeon: [name], MD;  [facility];  Service: Endoscopy;  Laterality: N/A;  12:00pm  ? CORONARY ARTERY BYPASS GRAFT N/A 03/28/2016  ? Procedure: CORONARY ARTERY BYPASS GRAFTING (CABG) x 5 USING GREATER SAPHENOUS VEIN;  Surgeon: [name], MD;  Location: [place] OR;  Service: Open Heart Surgery;  Laterality: N/A;  ? ENDOVEIN HARVEST OF GREATER SAPHENOUS VEIN Right 03/28/2016  ? Procedure: ENDOVEIN HARVEST OF GREATER SAPHENOUS VEIN;  Surgeon: [name], MD;  Location: [place];  Service: Open Heart Surgery;  Laterality: Right;  ? ESOPHAGEAL DILATION N/A 05/07/2015  ? Procedure: ESOPHAGEAL DILATION WITH 56FR MALONEY DILATOR;  Surgeon: [name], MD;  Location[facility];  Service: Endoscopy;  Laterality: N/A;  ? ESOPHAGOGASTRODUODENOSCOPY (EGD) WITH PROPOFOL N/A 05/07/2015  ? RMR: Status post dilation of normal esophagus. Gastritis.  ? IR CT HEAD LTD  12/17/2020  ? IR CT HEAD LTD  12/17/2020  ? IR INTRA CRAN STENT  12/17/2020  ? IR PERCUTANEOUS ART THROMBECTOMY/INFUSION INTRACRANIAL INC DIAG ANGIO  12/17/2020  ? IR RADIOLOGIST EVAL & MGMT  03/25/2021  ? KNEE SURGERY Left   ? arthroscopy  ?  MANDIBLE FRACTURE SURGERY    ? POLYPECTOMY  05/07/2015  ? Procedure: POLYPECTOMY (Hepatic Flexure, Distal Transverse Colon, Rectal);  Surgeon: [name], MD;  Location[facility];  Service: Endoscopy;;  ? RADIOLOGY WITH ANESTHESIA N/A 12/16/2020  ? Procedure: IR WITH ANESTHESIA;  Surgeon: Luanne Bras, MD;  Location: [place];  Service: Radiology;  Laterality: N/A;  ? TEE WITHOUT CARDIOVERSION N/A 03/28/2016  ? Procedure: TRANSESOPHAGEAL ECHOCARDIOGRAM (TEE);  Surgeon: [name], MD;  Location: [place];  Service: Open Heart Surgery;  Laterality: N/A;  ? TONSILLECTOMY    ? ?Family History  ?Problem Relation Age of Onset  ? Diabetes Father   ? Hypertension Father   ? Arthritis Other   ? Diabetes  Other   ? Asthma Other   ? Stroke Sister   ? ?Social History  ? ?Tobacco Use  ? Smoking status: Former  ?  Packs/day: 0.50  ?  Years: 30.00  ?  Pack years: 15.00  ?  Types: Cigarettes  ? Smokeless tobacco: Never  ? Tobacco comments:  ?  3 cigs per day  ?Vaping Use  ? Vaping Use: Never used  ?Substance Use Topics  ? Alcohol use: Not Currently  ? Drug use: Not Currently  ?  Types: Marijuana  ? ?Physical exam findings ? ?General appearance this is a well-developed well-nourished male BMI is 21 he is thin ? ?Cardiovascular exam shows no abnormalities in his upper extremities with good color capillary refill temperature and pulse ? ?Neurologically he has normal sensation in the digits of the left hand ? ? ? ?His exam today shows that his finger is indeed locked I pulled it free he has tenderness over the A1 pulley  ? ?both flexor tendons are intact there is no evidence of neurovascular compromise to the digit ? ?Diagnosis ?Trigger finger left long finger/middle finger ? ?Plan ?Release A1 pulley left long finger ?

## 2021-10-15 ENCOUNTER — Ambulatory Visit (HOSPITAL_COMMUNITY): Payer: Medicaid Other | Admitting: Certified Registered"

## 2021-10-15 ENCOUNTER — Other Ambulatory Visit: Payer: Self-pay

## 2021-10-15 ENCOUNTER — Ambulatory Visit (HOSPITAL_COMMUNITY)
Admission: RE | Admit: 2021-10-15 | Discharge: 2021-10-15 | Disposition: A | Discharge status: Home / Self Care | Payer: Medicaid Other | Attending: Orthopedic Surgery | Admitting: Orthopedic Surgery

## 2021-10-15 ENCOUNTER — Ambulatory Visit (HOSPITAL_BASED_OUTPATIENT_CLINIC_OR_DEPARTMENT_OTHER): Payer: Medicaid Other | Admitting: Certified Registered"

## 2021-10-15 ENCOUNTER — Encounter (HOSPITAL_COMMUNITY)
Admission: RE | Disposition: A | Discharge status: Home / Self Care | Payer: Self-pay | Source: Home / Self Care | Attending: Orthopedic Surgery

## 2021-10-15 ENCOUNTER — Encounter (HOSPITAL_COMMUNITY): Payer: Self-pay | Admitting: Orthopedic Surgery

## 2021-10-15 DIAGNOSIS — I129 Hypertensive chronic kidney disease with stage 1 through stage 4 chronic kidney disease, or unspecified chronic kidney disease: Secondary | ICD-10-CM

## 2021-10-15 DIAGNOSIS — I252 Old myocardial infarction: Secondary | ICD-10-CM | POA: Insufficient documentation

## 2021-10-15 DIAGNOSIS — M65332 Trigger finger, left middle finger: Secondary | ICD-10-CM

## 2021-10-15 DIAGNOSIS — E1140 Type 2 diabetes mellitus with diabetic neuropathy, unspecified: Secondary | ICD-10-CM | POA: Insufficient documentation

## 2021-10-15 DIAGNOSIS — Z87891 Personal history of nicotine dependence: Secondary | ICD-10-CM | POA: Diagnosis not present

## 2021-10-15 DIAGNOSIS — G4730 Sleep apnea, unspecified: Secondary | ICD-10-CM | POA: Insufficient documentation

## 2021-10-15 DIAGNOSIS — Z8673 Personal history of transient ischemic attack (TIA), and cerebral infarction without residual deficits: Secondary | ICD-10-CM | POA: Diagnosis not present

## 2021-10-15 DIAGNOSIS — Z833 Family history of diabetes mellitus: Secondary | ICD-10-CM | POA: Diagnosis not present

## 2021-10-15 DIAGNOSIS — G40909 Epilepsy, unspecified, not intractable, without status epilepticus: Secondary | ICD-10-CM | POA: Insufficient documentation

## 2021-10-15 DIAGNOSIS — F319 Bipolar disorder, unspecified: Secondary | ICD-10-CM | POA: Diagnosis not present

## 2021-10-15 DIAGNOSIS — I2510 Atherosclerotic heart disease of native coronary artery without angina pectoris: Secondary | ICD-10-CM | POA: Insufficient documentation

## 2021-10-15 DIAGNOSIS — N189 Chronic kidney disease, unspecified: Secondary | ICD-10-CM

## 2021-10-15 DIAGNOSIS — I10 Essential (primary) hypertension: Secondary | ICD-10-CM | POA: Diagnosis not present

## 2021-10-15 HISTORY — PX: TRIGGER FINGER RELEASE: SHX641

## 2021-10-15 LAB — GLUCOSE, CAPILLARY
Glucose-Capillary: 96 mg/dL (ref 70–99)
Glucose-Capillary: 65 mg/dL — ABNORMAL LOW (ref 70–99)

## 2021-10-15 SURGERY — RELEASE, A1 PULLEY, FOR TRIGGER FINGER
Anesthesia: General | Site: Hand | Laterality: Left

## 2021-10-15 MED ORDER — BUPIVACAINE HCL (PF) 0.5 % IJ SOLN
INTRAMUSCULAR | Status: DC | PRN
  Administered 2021-10-15: 10 mL

## 2021-10-15 MED ORDER — DEXAMETHASONE SODIUM PHOSPHATE 10 MG/ML IJ SOLN
INTRAMUSCULAR | Status: AC
  Filled 2021-10-15: qty 2

## 2021-10-15 MED ORDER — HYDROCODONE-ACETAMINOPHEN 5-325 MG PO TABS: 1.0000 | ORAL_TABLET | ORAL | 0 refills | Status: AC | PRN

## 2021-10-15 MED ORDER — DEXTROSE 50 % IV SOLN
INTRAVENOUS | Status: AC
  Filled 2021-10-15: qty 50

## 2021-10-15 MED ORDER — FENTANYL CITRATE (PF) 100 MCG/2ML IJ SOLN
INTRAMUSCULAR | Status: AC
  Filled 2021-10-15: qty 2

## 2021-10-15 MED ORDER — MIDAZOLAM HCL 2 MG/2ML IJ SOLN
INTRAMUSCULAR | Status: AC
  Filled 2021-10-15: qty 2

## 2021-10-15 MED ORDER — PROPOFOL 10 MG/ML IV BOLUS
INTRAVENOUS | Status: DC | PRN
  Administered 2021-10-15: 120 mg via INTRAVENOUS

## 2021-10-15 MED ORDER — LACTATED RINGERS IV SOLN: INTRAVENOUS | Status: DC | PRN

## 2021-10-15 MED ORDER — BUPIVACAINE HCL (PF) 0.5 % IJ SOLN
INTRAMUSCULAR | Status: AC
Start: 2021-10-15 — End: ?
  Filled 2021-10-15: qty 30

## 2021-10-15 MED ORDER — ONDANSETRON HCL 4 MG/2ML IJ SOLN
INTRAMUSCULAR | Status: AC
  Filled 2021-10-15: qty 8

## 2021-10-15 MED ORDER — CEFAZOLIN SODIUM-DEXTROSE 2-4 GM/100ML-% IV SOLN
2.0000 g | INTRAVENOUS | Status: AC
  Administered 2021-10-15: 2 g via INTRAVENOUS

## 2021-10-15 MED ORDER — CEFAZOLIN SODIUM-DEXTROSE 2-4 GM/100ML-% IV SOLN
INTRAVENOUS | Status: AC
  Filled 2021-10-15: qty 100

## 2021-10-15 MED ORDER — MIDAZOLAM HCL 5 MG/5ML IJ SOLN
INTRAMUSCULAR | Status: DC | PRN
Start: 2021-10-15 — End: 2021-10-15
  Administered 2021-10-15: 2 mg via INTRAVENOUS

## 2021-10-15 MED ORDER — FENTANYL CITRATE PF 50 MCG/ML IJ SOSY: 25.0000 ug | PREFILLED_SYRINGE | INTRAMUSCULAR | Status: DC | PRN

## 2021-10-15 MED ORDER — ONDANSETRON HCL 4 MG/2ML IJ SOLN: 4.0000 mg | Freq: Once | INTRAMUSCULAR | Status: DC | PRN

## 2021-10-15 MED ORDER — ONDANSETRON HCL 4 MG/2ML IJ SOLN
INTRAMUSCULAR | Status: DC | PRN
  Administered 2021-10-15: 4 mg via INTRAVENOUS

## 2021-10-15 MED ORDER — PROPOFOL 10 MG/ML IV BOLUS
INTRAVENOUS | Status: AC
Start: 2021-10-15 — End: ?
  Filled 2021-10-15: qty 20

## 2021-10-15 MED ORDER — SODIUM CHLORIDE 0.9 % IR SOLN
Status: DC | PRN
  Administered 2021-10-15: 1000 mL

## 2021-10-15 MED ORDER — FENTANYL CITRATE (PF) 100 MCG/2ML IJ SOLN
INTRAMUSCULAR | Status: DC | PRN
  Administered 2021-10-15: 50 ug via INTRAVENOUS

## 2021-10-15 MED ORDER — EPHEDRINE SULFATE (PRESSORS) 50 MG/ML IJ SOLN
INTRAMUSCULAR | Status: DC | PRN
Start: 2021-10-15 — End: 2021-10-15
  Administered 2021-10-15: 15 mg via INTRAVENOUS

## 2021-10-15 SURGICAL SUPPLY — 41 items
APL PRP STRL LF DISP 70% ISPRP (MISCELLANEOUS) ×1
BANDAGE ESMARK 4X12 BL STRL LF (DISPOSABLE) ×2 IMPLANT
BLADE SURG 15 STRL LF DISP TIS (BLADE) ×2 IMPLANT
BLADE SURG 15 STRL SS (BLADE) ×2
BNDG CMPR 12X4 ELC STRL LF (DISPOSABLE) ×1
BNDG CMPR STD VLCR NS LF 5.8X2 (GAUZE/BANDAGES/DRESSINGS) ×1
BNDG COHESIVE 4X5 TAN STRL (GAUZE/BANDAGES/DRESSINGS) ×3 IMPLANT
BNDG CONFORM 2 STRL LF (GAUZE/BANDAGES/DRESSINGS) ×3 IMPLANT
BNDG ELASTIC 2X5.8 VLCR NS LF (GAUZE/BANDAGES/DRESSINGS) ×3 IMPLANT
BNDG ESMARK 4X12 BLUE STRL LF (DISPOSABLE) ×2
CHLORAPREP W/TINT 26 (MISCELLANEOUS) ×3 IMPLANT
CLOTH BEACON ORANGE TIMEOUT ST (SAFETY) ×3 IMPLANT
COVER LIGHT HANDLE STERIS (MISCELLANEOUS) ×6 IMPLANT
CUFF TOURN SGL QUICK 18X4 (TOURNIQUET CUFF) ×3 IMPLANT
DECANTER SPIKE VIAL GLASS SM (MISCELLANEOUS) ×3 IMPLANT
DRAPE HALF SHEET 40X57 (DRAPES) ×3 IMPLANT
ELECT NDL TIP 2.8 STRL (NEEDLE) ×2 IMPLANT
ELECT NEEDLE TIP 2.8 STRL (NEEDLE) ×2 IMPLANT
ELECT REM PT RETURN 9FT ADLT (ELECTROSURGICAL) ×2
ELECTRODE REM PT RTRN 9FT ADLT (ELECTROSURGICAL) ×2 IMPLANT
GAUZE 4X4 16PLY ~~LOC~~+RFID DBL (SPONGE) ×1 IMPLANT
GAUZE SPONGE 4X4 12PLY STRL (GAUZE/BANDAGES/DRESSINGS) ×3 IMPLANT
GAUZE XEROFORM 1X8 LF (GAUZE/BANDAGES/DRESSINGS) ×3 IMPLANT
GLOVE SS N UNI LF 8.5 STRL (GLOVE) ×3 IMPLANT
GLOVE SURG POLYISO LF SZ7 (GLOVE) ×1 IMPLANT
GLOVE SURG POLYISO LF SZ8 (GLOVE) ×4 IMPLANT
GLOVE SURG UNDER POLY LF SZ7 (GLOVE) ×6 IMPLANT
GOWN STRL REUS W/TWL LRG LVL3 (GOWN DISPOSABLE) ×3 IMPLANT
GOWN STRL REUS W/TWL XL LVL3 (GOWN DISPOSABLE) ×3 IMPLANT
KIT TURNOVER KIT A (KITS) ×3 IMPLANT
MANIFOLD NEPTUNE II (INSTRUMENTS) ×3 IMPLANT
NDL HYPO 21X1.5 SAFETY (NEEDLE) ×2 IMPLANT
NEEDLE HYPO 21X1.5 SAFETY (NEEDLE) ×2 IMPLANT
NS IRRIG 1000ML POUR BTL (IV SOLUTION) ×3 IMPLANT
PACK BASIC LIMB (CUSTOM PROCEDURE TRAY) ×3 IMPLANT
PAD ARMBOARD 7.5X6 YLW CONV (MISCELLANEOUS) ×3 IMPLANT
POSITIONER HAND ALUMI XLG (MISCELLANEOUS) ×3 IMPLANT
SET BASIN LINEN APH (SET/KITS/TRAYS/PACK) ×3 IMPLANT
SPONGE GAUZE 2X2 8PLY STRL LF (GAUZE/BANDAGES/DRESSINGS) ×1 IMPLANT
SUT ETHILON 3 0 FSL (SUTURE) ×3 IMPLANT
SYR CONTROL 10ML LL (SYRINGE) ×3 IMPLANT

## 2021-10-15 NOTE — Brief Op Note (Signed)
OPERATIVE REPORT  ? ?10/15/2021 ?1:00 PM ?[name], MD ? ? ?Preop diagnosis trigger finger left long finger ?Postop diagnosis same  ?Procedure release A1 pulley left long finger ?Surgeon Aline Brochure ?Anesthesia LMA general anesthesia  ?operation findings: Stenosing tenosynovitis flexor tendon A1 pulley with nodularity of the tendon ?No assistants ?Counts were correct ?Clean case no specimen ?10 mL of Marcaine with out epinephrine injected after the case ?Patient to recovery room patient's stable condition ? ?The procedure was performed as follows ? ?The patient was identified in the preop area and the surgical site was confirmed and marked, chart update was completed patient taken to surgery given appropriate antibiotics based on her allergy profile ? ?After successful general anesthesia the arm was prepped and draped sterilely and a timeout was completed ? ?The left upper extremity was exsanguinated forward Esmarch and the tourniquet was elevated to 250 mmHg  ? ? ?A longitudinal incision was made over the A1 pulley of the left long finger subcutaneous tissue was divided blunt dissection was carried out to protect neurovascular structures. A blunt instrument was placed underneath the A1 pulley and the A1 pulley was released. Flexion extension of the digit confirmed removal of mechanical block. Wound was irrigated and closed with 3-0 nylon suture ? ?We took the patient to recovery room in stable condition ? ?[name], MD  ?

## 2021-10-15 NOTE — Transfer of Care (Signed)
Immediate Anesthesia Transfer of Care Note ? ?Patient: Melvin Taylor ? ?Procedure(s) Performed: RELEASE TRIGGER FINGER/A-1 PULLEY left middle or long finger (Left: Hand) ? ?Patient Location: PACU ? ?Anesthesia Type:General ? ?Level of Consciousness: sedated ? ?Airway & Oxygen Therapy: Patient Spontanous Breathing ? ?Post-op Assessment: Report given to RN and Post -op Vital signs reviewed and stable ? ?Post vital signs: Reviewed and stable ? ?Last Vitals:  ?Vitals Value Taken Time  ?BP 130/76 10/15/21 1301  ?Temp    ?Pulse 58 10/15/21 1310  ?Resp 13 10/15/21 1310  ?SpO2 98 % 10/15/21 1310  ?Vitals shown include unvalidated device data. ? ?Last Pain:  ?Vitals:  ? 10/15/21 1019  ?TempSrc: Oral  ?PainSc: 0-No pain  ?   ? ?  ? ?Complications: No notable events documented. ?

## 2021-10-15 NOTE — Op Note (Signed)
OPERATIVE REPORT  ?  ?10/15/2021 ?1:00 PM ?[name], MD ?  ?  ?Preop diagnosis trigger finger left long finger ?Postop diagnosis same  ?Procedure release A1 pulley left long finger ?Surgeon Aline Brochure ?Anesthesia LMA general anesthesia  ?operation findings: Stenosing tenosynovitis flexor tendon A1 pulley with nodularity of the tendon ?No assistants ?Counts were correct ?Clean case no specimen ?10 mL of Marcaine with out epinephrine injected after the case ?Patient to recovery room patient's stable condition ?  ?The procedure was performed as follows ?  ?The patient was identified in the preop area and the surgical site was confirmed and marked, chart update was completed patient taken to surgery given appropriate antibiotics based on her allergy profile ?  ?After successful general anesthesia the arm was prepped and draped sterilely and a timeout was completed ? ?The left upper extremity was exsanguinated forward Esmarch and the tourniquet was elevated to 250 mmHg  ?  ?  ?A longitudinal incision was made over the A1 pulley of the left long finger subcutaneous tissue was divided blunt dissection was carried out to protect neurovascular structures. A blunt instrument was placed underneath the A1 pulley and the A1 pulley was released. Flexion extension of the digit confirmed removal of mechanical block. Wound was irrigated and closed with 3-0 nylon suture ?  ?We took the patient to recovery room in stable condition ?  ?[name], MD   ?  ?  ?  ? ?

## 2021-10-15 NOTE — Anesthesia Preprocedure Evaluation (Signed)
Anesthesia Evaluation  ?Patient identified by MRN, date of birth, ID band ?Patient awake ? ? ? ?Reviewed: ?Allergy & Precautions, H&P , NPO status , Patient's Chart, lab work & pertinent test results, reviewed documented beta blocker date and time  ? ?Airway ?Mallampati: II ? ?TM Distance: >3 FB ?Neck ROM: full ? ? ? Dental ?no notable dental hx. ? ?  ?Pulmonary ?shortness of breath, sleep apnea , former smoker,  ?  ?Pulmonary exam normal ?breath sounds clear to auscultation ? ? ? ? ? ? Cardiovascular ?Exercise Tolerance: Good ?hypertension, + CAD and + Past MI  ? ?Rhythm:regular Rate:Normal ? ? ?  ?Neuro/Psych ? Headaches, Seizures -, Well Controlled,  PSYCHIATRIC DISORDERS Anxiety Depression Bipolar Disorder Schizophrenia  Neuromuscular disease CVA   ? GI/Hepatic ?negative GI ROS, Neg liver ROS,   ?Endo/Other  ?negative endocrine ROSdiabetes ? Renal/GU ?CRFRenal disease  ?negative genitourinary ?  ?Musculoskeletal ? ? Abdominal ?  ?Peds ? Hematology ? ?(+) Blood dyscrasia, anemia ,   ?Anesthesia Other Findings ? ? Reproductive/Obstetrics ?negative OB ROS ? ?  ? ? ? ? ? ? ? ? ? ? ? ? ? ?  ?  ? ? ? ? ? ? ? ? ?Anesthesia Physical ?Anesthesia Plan ? ?ASA: 3 ? ?Anesthesia Plan: General and General LMA  ? ?Post-op Pain Management:   ? ?Induction:  ? ?PONV Risk Score and Plan: Ondansetron ? ?Airway Management Planned:  ? ?Additional Equipment:  ? ?Intra-op Plan:  ? ?Post-operative Plan:  ? ?Informed Consent: I have reviewed the patients History and Physical, chart, labs and discussed the procedure including the risks, benefits and alternatives for the proposed anesthesia with the patient or authorized representative who has indicated his/her understanding and acceptance.  ? ? ? ?Dental Advisory Given ? ?Plan Discussed with: CRNA ? ?Anesthesia Plan Comments:   ? ? ? ? ? ? ?Anesthesia Quick Evaluation ? ?

## 2021-10-15 NOTE — Anesthesia Postprocedure Evaluation (Signed)
Anesthesia Post Note ? ?Patient: Raykwon Hobbs Canty ? ?Procedure(s) Performed: RELEASE TRIGGER FINGER/A-1 PULLEY left middle or long finger (Left: Hand) ? ?Patient location during evaluation: Phase II ?Anesthesia Type: General ?Level of consciousness: awake ?Pain management: pain level controlled ?Vital Signs Assessment: post-procedure vital signs reviewed and stable ?Respiratory status: spontaneous breathing and respiratory function stable ?Cardiovascular status: blood pressure returned to baseline and stable ?Postop Assessment: no headache and no apparent nausea or vomiting ?Anesthetic complications: no ?Comments: Late entry ? ? ?No notable events documented. ? ? ?Last Vitals:  ?Vitals:  ? 10/15/21 1315 10/15/21 1330  ?BP: 129/73 118/80  ?Pulse: (!) 59 (!) 59  ?Resp: 12 12  ?Temp:    ?SpO2: 98% 100%  ?  ?Last Pain:  ?Vitals:  ? 10/15/21 1330  ?TempSrc:   ?PainSc: 0-No pain  ? ? ?  ?  ?  ?  ?  ?  ? ?Louann Sjogren ? ? ? ? ?

## 2021-10-15 NOTE — Interval H&P Note (Signed)
History and Physical Interval Note: ? ?10/15/2021 ?12:06 PM ? ?Melvin Taylor  has presented today for surgery, with the diagnosis of Left long finger or middle finger triggering.  The various methods of treatment have been discussed with the patient and family. After consideration of risks, benefits and other options for treatment, the patient has consented to  Procedure(s): ?RELEASE TRIGGER FINGER/A-1 PULLEY left middle or long finger (Left) as a surgical intervention.  The patient's history has been reviewed, patient examined, no change in status, stable for surgery.  I have reviewed the patient's chart and labs.  Questions were answered to the patient's satisfaction.   ? ? ?[name] ? ? ?

## 2021-10-15 NOTE — Progress Notes (Signed)
CBG 142 after 25 of dextrose.  ?

## 2021-10-15 NOTE — Brief Op Note (Signed)
OPERATIVE REPORT  ? ?10/15/2021 ?12:58 PM ?[name], MD ? ? ?Preop diagnosis trigger finger left long finger ?Postop diagnosis same  ?Procedure release A1 pulley left long finger ?Surgeon Aline Brochure ?Anesthesia LMA general anesthesia  ?operation findings: Stenosing tenosynovitis flexor tendon A1 pulley with nodularity of the tendon ?No assistants ?Counts were correct ?Clean case no specimen ?10 mL of Marcaine with out epinephrine injected after the case ?Patient to recovery room patient's stable condition ? ?The procedure was performed as follows ? ?The patient was identified in the preop area and the surgical site was confirmed and marked, chart update was completed patient taken to surgery given appropriate antibiotics based on her allergy profile ? ?After successful general anesthesia the arm was prepped and draped sterilely and a timeout was completed ? ?The left upper extremity was exsanguinated forward Esmarch and the tourniquet was elevated to 250 mmHg  ? ? ?A longitudinal incision was made over the A1 pulley of the left long finger subcutaneous tissue was divided blunt dissection was carried out to protect neurovascular structures. A blunt instrument was placed underneath the A1 pulley and the A1 pulley was released. Flexion extension of the digit confirmed removal of mechanical block. Wound was irrigated and closed with 3-0 nylon suture ? ?We took the patient to recovery room in stable condition ? ?[name], MD  ?

## 2021-10-15 NOTE — Anesthesia Procedure Notes (Signed)
Procedure Name: LMA Insertion ?Date/Time: 10/15/2021 12:35 PM ?Performed by: Minerva Ends, CRNA ?Pre-anesthesia Checklist: Patient identified, Emergency Drugs available, Suction available and Patient being monitored ?Patient Re-evaluated:Patient Re-evaluated prior to induction ?Oxygen Delivery Method: Circle system utilized ?Preoxygenation: Pre-oxygenation with 100% oxygen ?Induction Type: IV induction ?Ventilation: Mask ventilation without difficulty ?LMA: LMA inserted ?LMA Size: 4.0 ?Tube type: Oral ?Number of attempts: 1 ?Placement Confirmation: positive ETCO2 and breath sounds checked- equal and bilateral ?Tube secured with: Tape ?Dental Injury: Teeth and Oropharynx as per pre-operative assessment  ? ? ? ? ?

## 2021-10-19 ENCOUNTER — Encounter (HOSPITAL_COMMUNITY): Payer: Self-pay | Admitting: Orthopedic Surgery

## 2021-10-19 LAB — GLUCOSE, CAPILLARY
Glucose-Capillary: 142 mg/dL — ABNORMAL HIGH (ref 70–99)
Glucose-Capillary: 64 mg/dL — ABNORMAL LOW (ref 70–99)

## 2021-10-21 ENCOUNTER — Telehealth: Payer: Self-pay | Admitting: Orthopedic Surgery

## 2021-10-21 NOTE — Telephone Encounter (Signed)
Patient came to office this morning, 11:3am, with care giver -  said here for appointment with Dr Aline Brochure. Per notes, appointment for post op visit from surgery 10/15/21 is 10/28/21. Relayed to patient Dr Aline Brochure is not in clinic at  this time and relayed I will speak with administrator Abigail Butts* ? *per Abigail Butts, as long as patient is not having any problems, ok to keep appointment as scheduled. Said if any concerns, offer this afternoon, 1:50pm, when Dr Aline Brochure will be in clinic. Discussed with patient - said no problems; will keep 10/28/21 appointment as scheduled, and is aware to call with any questions or concerns in event appointment date may need to be moved up. Patient and care giver voiced understanding. ?

## 2021-10-28 ENCOUNTER — Ambulatory Visit (INDEPENDENT_AMBULATORY_CARE_PROVIDER_SITE_OTHER): Payer: Medicaid Other | Admitting: Orthopedic Surgery

## 2021-10-28 DIAGNOSIS — M65332 Trigger finger, left middle finger: Secondary | ICD-10-CM

## 2021-10-28 NOTE — Progress Notes (Signed)
FOLLOW UP  ? ?Encounter Diagnosis  ?Name Primary?  ? Trigger finger, left middle finger Yes  ? ? ? ?Chief Complaint  ?Patient presents with  ? Routine Post Op  ?  Trigger finger release left long finger ?DOS 10/15/21  ? ? ? ?Postop visit #1 status post trigger finger surgery A1 pulley release left middle finger sutures were removed wound looks clean patient has adequate range of motion encouraged to continue active range of motion follow-up as needed ?

## 2021-11-02 ENCOUNTER — Telehealth: Payer: Medicaid Other | Admitting: Adult Health

## 2021-11-02 ENCOUNTER — Ambulatory Visit: Payer: Medicaid Other | Admitting: Adult Health

## 2021-11-02 ENCOUNTER — Encounter: Payer: Self-pay | Admitting: Adult Health

## 2021-11-08 ENCOUNTER — Ambulatory Visit (INDEPENDENT_AMBULATORY_CARE_PROVIDER_SITE_OTHER): Payer: Medicaid Other | Admitting: Podiatry

## 2021-11-08 ENCOUNTER — Encounter: Payer: Self-pay | Admitting: Podiatry

## 2021-11-08 ENCOUNTER — Telehealth: Payer: Medicaid Other | Admitting: Adult Health

## 2021-11-08 DIAGNOSIS — M79675 Pain in left toe(s): Secondary | ICD-10-CM | POA: Diagnosis not present

## 2021-11-08 DIAGNOSIS — E1142 Type 2 diabetes mellitus with diabetic polyneuropathy: Secondary | ICD-10-CM

## 2021-11-08 DIAGNOSIS — B351 Tinea unguium: Secondary | ICD-10-CM

## 2021-11-08 DIAGNOSIS — M79674 Pain in right toe(s): Secondary | ICD-10-CM | POA: Diagnosis not present

## 2021-11-08 NOTE — Progress Notes (Signed)
This patient returns to my office for at risk foot care.  This patient requires this care by a professional since this patient will be at risk due to having diabetic neuropathy.  This patient is unable to cut nails himself since the patient cannot reach his nails.These nails are painful walking and wearing shoes.  This patient presents for at risk foot care today.  General Appearance  Alert, conversant and in no acute stress.  Vascular  Dorsalis pedis and posterior tibial  pulses are palpable  bilaterally.  Capillary return is within normal limits  bilaterally. Temperature is within normal limits  bilaterally.  Neurologic  Senn-Weinstein monofilament wire test within normal limits  bilaterally. Muscle power within normal limits bilaterally.  Nails Thick disfigured discolored nails with subungual debris  from hallux to fifth toes bilaterally. No evidence of bacterial infection or drainage bilaterally.  Orthopedic  No limitations of motion  feet .  No crepitus or effusions noted.  No bony pathology or digital deformities noted.  Skin  normotropic skin with no porokeratosis noted bilaterally.  No signs of infections or ulcers noted.     Onychomycosis  Pain in right toes  Pain in left toes  Consent was obtained for treatment procedures.   Mechanical debridement of nails 1-5  bilaterally performed with a nail nipper.  Filed with dremel without incident.    Return office visit   3 months                   Told patient to return for periodic foot care and evaluation due to potential at risk complications.   Navika Hoopes DPM   

## 2021-11-09 ENCOUNTER — Other Ambulatory Visit: Payer: Self-pay | Admitting: "Endocrinology

## 2021-11-15 ENCOUNTER — Telehealth: Payer: Medicaid Other | Admitting: Adult Health

## 2021-11-23 ENCOUNTER — Ambulatory Visit (HOSPITAL_COMMUNITY)
Admission: RE | Admit: 2021-11-23 | Discharge: 2021-11-23 | Disposition: A | Discharge status: Home / Self Care | Payer: Medicaid Other | Source: Ambulatory Visit | Attending: Nephrology | Admitting: Nephrology

## 2021-11-23 ENCOUNTER — Encounter (HOSPITAL_COMMUNITY): Payer: Self-pay

## 2021-11-23 DIAGNOSIS — D631 Anemia in chronic kidney disease: Secondary | ICD-10-CM | POA: Insufficient documentation

## 2021-11-23 DIAGNOSIS — N1832 Chronic kidney disease, stage 3b: Secondary | ICD-10-CM | POA: Diagnosis not present

## 2021-11-23 LAB — POCT HEMOGLOBIN-HEMACUE: Hemoglobin: 9.1 g/dL — ABNORMAL LOW (ref 13.0–17.0)

## 2021-11-23 MED ORDER — EPOETIN ALFA-EPBX 3000 UNIT/ML IJ SOLN
3000.0000 [IU] | Freq: Once | INTRAMUSCULAR | Status: AC
  Administered 2021-11-23: 3000 [IU] via SUBCUTANEOUS
  Filled 2021-11-23: qty 1

## 2021-12-07 ENCOUNTER — Encounter (HOSPITAL_COMMUNITY): Payer: Medicaid Other

## 2021-12-07 ENCOUNTER — Encounter (HOSPITAL_COMMUNITY)
Admission: RE | Admit: 2021-12-07 | Discharge: 2021-12-07 | Disposition: A | Discharge status: Home / Self Care | Payer: Medicaid Other | Source: Ambulatory Visit | Attending: Nephrology | Admitting: Nephrology

## 2021-12-07 DIAGNOSIS — D631 Anemia in chronic kidney disease: Secondary | ICD-10-CM | POA: Insufficient documentation

## 2021-12-07 DIAGNOSIS — N1832 Chronic kidney disease, stage 3b: Secondary | ICD-10-CM | POA: Insufficient documentation

## 2021-12-08 ENCOUNTER — Other Ambulatory Visit: Payer: Self-pay | Admitting: "Endocrinology

## 2021-12-15 ENCOUNTER — Ambulatory Visit (HOSPITAL_COMMUNITY)
Admission: RE | Admit: 2021-12-15 | Discharge: 2021-12-15 | Disposition: A | Discharge status: Home / Self Care | Payer: Medicaid Other | Source: Ambulatory Visit | Attending: Nurse Practitioner | Admitting: Nurse Practitioner

## 2021-12-15 ENCOUNTER — Other Ambulatory Visit (HOSPITAL_COMMUNITY): Payer: Self-pay | Admitting: Nurse Practitioner

## 2021-12-15 DIAGNOSIS — M25511 Pain in right shoulder: Secondary | ICD-10-CM | POA: Insufficient documentation

## 2021-12-15 DIAGNOSIS — M25512 Pain in left shoulder: Secondary | ICD-10-CM | POA: Insufficient documentation

## 2021-12-21 ENCOUNTER — Ambulatory Visit (HOSPITAL_COMMUNITY): Payer: Medicaid Other

## 2021-12-23 ENCOUNTER — Encounter (HOSPITAL_COMMUNITY)
Admission: RE | Admit: 2021-12-23 | Discharge: 2021-12-23 | Disposition: A | Discharge status: Home / Self Care | Payer: Medicaid Other | Source: Ambulatory Visit | Attending: Nephrology | Admitting: Nephrology

## 2021-12-23 VITALS — BP 120/80 | HR 64 | Resp 16

## 2021-12-23 DIAGNOSIS — D631 Anemia in chronic kidney disease: Secondary | ICD-10-CM | POA: Diagnosis not present

## 2021-12-23 DIAGNOSIS — N1832 Chronic kidney disease, stage 3b: Secondary | ICD-10-CM | POA: Diagnosis present

## 2021-12-23 DIAGNOSIS — D638 Anemia in other chronic diseases classified elsewhere: Secondary | ICD-10-CM

## 2021-12-23 LAB — POCT HEMOGLOBIN-HEMACUE: Hemoglobin: 9.1 g/dL — ABNORMAL LOW (ref 13.0–17.0)

## 2021-12-23 MED ORDER — EPOETIN ALFA 3000 UNIT/ML IJ SOLN
3000.0000 [IU] | Freq: Once | INTRAMUSCULAR | Status: AC
  Administered 2021-12-23: 3000 [IU] via SUBCUTANEOUS
  Filled 2021-12-23: qty 1

## 2022-01-06 ENCOUNTER — Encounter (HOSPITAL_COMMUNITY)
Admission: RE | Admit: 2022-01-06 | Discharge: 2022-01-06 | Disposition: A | Discharge status: Home / Self Care | Payer: Medicaid Other | Source: Ambulatory Visit | Attending: Nephrology | Admitting: Nephrology

## 2022-01-06 VITALS — BP 103/62 | HR 66 | Temp 97.7°F | Resp 18 | Ht 66.0 in | Wt 131.0 lb

## 2022-01-06 DIAGNOSIS — D638 Anemia in other chronic diseases classified elsewhere: Secondary | ICD-10-CM | POA: Insufficient documentation

## 2022-01-06 DIAGNOSIS — N289 Disorder of kidney and ureter, unspecified: Secondary | ICD-10-CM | POA: Diagnosis present

## 2022-01-06 LAB — POCT HEMOGLOBIN-HEMACUE: Hemoglobin: 9.2 g/dL — ABNORMAL LOW (ref 13.0–17.0)

## 2022-01-06 MED ORDER — EPOETIN ALFA 3000 UNIT/ML IJ SOLN
INTRAMUSCULAR | Status: AC
  Filled 2022-01-06: qty 1

## 2022-01-06 MED ORDER — EPOETIN ALFA 3000 UNIT/ML IJ SOLN
3000.0000 [IU] | Freq: Once | INTRAMUSCULAR | Status: AC
  Administered 2022-01-06: 3000 [IU] via SUBCUTANEOUS

## 2022-01-20 ENCOUNTER — Encounter (HOSPITAL_COMMUNITY): Payer: Self-pay

## 2022-01-20 ENCOUNTER — Encounter (HOSPITAL_COMMUNITY)
Admission: RE | Admit: 2022-01-20 | Discharge: 2022-01-20 | Disposition: A | Discharge status: Home / Self Care | Payer: Medicaid Other | Source: Ambulatory Visit | Attending: Nephrology | Admitting: Nephrology

## 2022-01-20 VITALS — BP 104/72 | HR 66 | Temp 98.0°F | Resp 18 | Ht 66.0 in | Wt 131.0 lb

## 2022-01-20 DIAGNOSIS — N289 Disorder of kidney and ureter, unspecified: Secondary | ICD-10-CM

## 2022-01-20 DIAGNOSIS — D638 Anemia in other chronic diseases classified elsewhere: Secondary | ICD-10-CM | POA: Diagnosis not present

## 2022-01-20 LAB — POCT HEMOGLOBIN-HEMACUE: Hemoglobin: 9.4 g/dL — ABNORMAL LOW (ref 13.0–17.0)

## 2022-01-20 MED ORDER — EPOETIN ALFA 3000 UNIT/ML IJ SOLN
3000.0000 [IU] | Freq: Once | INTRAMUSCULAR | Status: AC
  Administered 2022-01-20: 3000 [IU] via SUBCUTANEOUS
  Filled 2022-01-20: qty 1

## 2022-01-25 NOTE — Progress Notes (Signed)
[facility] [address] [place]. [place] [address] [id number]       STROKE FOLLOW UP NOTE  [name] Date of Birth:  1965/10/03 Medical Record Number:  [id number]   Reason for Referral: stroke follow up    SUBJECTIVE:   CHIEF COMPLAINT:  Chief Complaint  Patient presents with   Cerebrovascular Accident    Rm 3,  4 month FU  "no new concerns"     HPI:   Update 01/26/2022 Melvin Taylor: Patient returns for stroke follow-up after prior visit 6 months ago.  He is unaccompanied.  Caregiver in lobby.  Has not been feeling well.  Reports continued right-sided weakness which he believes has been gradually worsening. Reports multiple areas of joint pain.  Positive sedentary without much activity during the day and no routine exercise.  Continued excessive daytime fatigue, has been having difficulty sleeping at night.  Continued neck pain.  Lives alone but now has caregiver assistance daily from 8:30 AM -4:30 PM who assists with majority of IADLs including medications. Reports compliance on Depakote, was previously on 1000 mg twice daily but currently only taking 500 mg twice daily, sister decreased to see if decreased dosage helped with fatigue, pt denies any noticeable benefit.  Denies any seizure activity.  Remains on Lamictal for bipolar disorder.  Remains on Brilinta and atorvastatin, denies side effects.  Blood pressure 112/76.  Denies new stroke/TIA symptoms.  Completed imaging after prior visit due to concerns of worsening RUE weakness showed chronic prior infarcts and T1 hyperintensity within the left lentiform nuclei new compared to imaging in 11/2020 possibly related to dystrophic mineralization vs artifact, no acute findings.   Since prior visit, evaluated by nephrology for worsening kidney disease.  Currently receiving Epogen shots for anemia of chronic disease.  Also remains on vitamin D supplement for vitamin D deficiency.  No further concerns  today     History provided for reference purposes only Update 07/12/2021 Melvin Taylor: Patient returns for 32-month stroke follow-up with sister present via cell phone  c/o worsening right hand weakness, fatigue, and headaches -Worsening RUE weakness - c/o wrist/hand pain and weakness and difficulty grasping in right hand. This has been present since stroke but believes this has been worsening.  Previously working with OT and placed on hold until further eval for fatigue can be completed but noted doing greater frequency of HEP (tried to educate multipel times per notes). Sister reports she would encourage him to do this exercise throughout the day as she felt this would be helpful. OT notes did note some decline of grip strength bilaterally  -C/o fatigue throughout the day over the past month, sleeping during the day and at night. He belives this is due to medication side effects. Sister reports currently taking abx for tooth infection and UTI. PCP also recently recommended starting iron supplement and vitamin D supplement but not yet started.   -C/o headache almost every other day (reports present since his stroke) present upon awakening and at night when laying in bed, right-sided radiating to back of neck, descried as throbbing and aching. Has not tried any conservative measures. Does endorse chronic neck pain with prior history of C5-6 ACDF . Does believe pain gradually worsening.    Continued cognitive impairment - limited progress with SLP due to poor attendance and poor motivation there has since been discharged. Sister continues to manage medications ensuring compliance.   Compliant on Brilinta and atorvastatin -denies side effects Blood pressure today 110/73 Denies tobacco  use.  Followed by Dr. Leta Taylor for history of seizures on Depakote and lamotrigine. No witnessed seizure activity. No recent medication changes  F/u with IR 8/23 in office with repeat MRA yesterday - planned to continue  Brilinta until f/u imaging completed  No further concerns at this time    Initial visit 03/09/2021 Melvin Taylor: Melvin Taylor is being seen for hospital follow-up accompanied by his sister, Melvin Taylor.  Overall stable since discharge without new stroke/TIA symptoms.  Reports residual RUE weakness with gradual improvement although still has difficulty with use.  Denies residual RLE weakness.  Completed HH therapies.  Sister also reports short-term memory impairment which was present previously but worsened post stroke.  He is currently living with his aunt.  Previously living on his own but had an aide 6 days/week during the day.  Able to maintain ADLs independently.  Needs assistance for IADLs. Sister also reports generalized tremors but per patient, these have been present prior to his stroke.  They are usually worse in the morning and get better as the day goes on.  Compliant on Brilinta and atorvastatin 80 mg daily -denies side effects.  Blood pressure today 150/92.  Routinely monitors at home and typically stable.  Complete tobacco cessation since discharge.  No seizure activity with continued use of Depakote 1067m BID and Lamictal 50 mg twice daily. Sister reports likely medication noncompliance previously as they found multiple full bottles of medications in his home.  Family now provides assistance for medication administration.  No further concerns at this time.  Stroke admission 12/13/2020 Mr. Melvin SPRADLEYis a 56y.o. male with history of seizure on Depakote and Lamictal, CAD status post CABG, diabetes, hypertension, hyperlipidemia, OSA on CPAP, stage 3 CKD, substance abuse admitted to OSH on 12/16/2020 for seizure, right-sided weakness and slurred speech.  Personally reviewed hospitalization, pertinent progress notes, imaging and lab work.  NIHSS 14. No tPA d/t outside window.  CTA L M1 occlusion and CT perfusion 160cc at risk (although slightly overestimated).  [facility].   Complete stroke work-up revealed left MCA infarct due to left M1 occlusion s/p IR with TICI 3 reperfusion and L MCA stenting, secondary to large vessel disease.  Repeat CT head showed trace SAH postprocedure.  In addition to acute infarcts, MRI showed small chronic cortical infarcts in the right occipital lobe. MRA diffuse intracranial stenosis.  EF 65 to 70%.  LDL 116 - increased atorvastatin from 469mto 8070maily.  A1c 9.7.  Initiated Brilinta post stent, hx of asa allergy.  History of seizure disorder on Depakote and Lamictal with recent breakthrough seizure discharged on Depakote 1000 mg twice daily (1500m38mD PTA) and Lamictal 50 mg twice daily.  HTN stable long-term BP goal 130-150.  Current tobacco use.  UDS positive for THC.  Residual deficit of cognitive impairment, mild dysarthria, right-sided weakness.  Evaluated by therapies and discharged to CIR.      ROS:   14 system review of systems performed and negative with exception of those listed in HPI  PMH:  Past Medical History:  Diagnosis Date   Anxiety    Bipolar 1 disorder (HCC)[place] Bone spur    left heel   Cervical radiculopathy    Chest pain    Chronic back pain    Chronic chest wall pain    Chronic neck pain    Coronary artery disease    a. s/p CABG in 03/2016 with LIMA-LAD, SVG-D1, SVG-RCA, and Seq  SVG-mid and distal OM   Depression    Diabetes mellitus    Type II   Hallucinations    "long history of them"   Headache(784.0)    History of gout    HTN (hypertension)    Insomnia    Myocardial infarction ([place]) 2017   Neuropathy    Pain management    Polysubstance abuse ([place])    Right leg pain    chronic   Schizophrenia ([place])    Seizures ([place])    last sz between July 5-9th, 2016; epilepsy   Shortness of breath dyspnea    with exertion   Sleep apnea    Stroke [facility])    Transfusion of blood product refused for religious reason     PSH:  Past Surgical History:  Procedure Laterality Date   ANTERIOR CERVICAL  DECOMP/DISCECTOMY FUSION N/A 12/14/2015   Procedure: ANTERIOR CERVICAL DECOMPRESSION/DISCECTOMY FUSION CERVICAL FIVE -SIX;  Surgeon: Melvin Larsson, MD;  Location: [place] NEURO ORS;  Service: Neurosurgery;  Laterality: N/A;   BIOPSY  05/07/2015   Procedure: BIOPSY (Gastric);  Surgeon: Melvin Dolin, MD;  Location: [facility];  Service: Endoscopy;;   CARDIAC CATHETERIZATION N/A 03/07/2016   Procedure: Left Heart Cath and Coronary Angiography;  Surgeon: Melvin Crome, MD;  Location: [place] CV LAB;  Service: Cardiovascular;  Laterality: N/A;   COLONOSCOPY WITH PROPOFOL N/A 05/07/2015   WIO:[id number] diverticulosis, multiple colon polyps removed, tubular adenoma, serrated colon polyp. Next colonoscopy October 2019   COLONOSCOPY WITH PROPOFOL N/A 08/02/2018   Procedure: COLONOSCOPY WITH PROPOFOL;  Surgeon: Melvin Dolin, MD;  Location: [facility];  Service: Endoscopy;  Laterality: N/A;  12:00pm   CORONARY ARTERY BYPASS GRAFT N/A 03/28/2016   Procedure: CORONARY ARTERY BYPASS GRAFTING (CABG) x 5 USING GREATER SAPHENOUS VEIN;  Surgeon: Melvin Pollack, MD;  Location: [place] OR;  Service: Open Heart Surgery;  Laterality: N/A;   ENDOVEIN HARVEST OF GREATER SAPHENOUS VEIN Right 03/28/2016   Procedure: ENDOVEIN HARVEST OF GREATER SAPHENOUS VEIN;  Surgeon: Melvin Pollack, MD;  Location: [place];  Service: Open Heart Surgery;  Laterality: Right;   ESOPHAGEAL DILATION N/A 05/07/2015   Procedure: ESOPHAGEAL DILATION WITH 56FR MALONEY DILATOR;  Surgeon: Melvin Dolin, MD;  Location: [facility];  Service: Endoscopy;  Laterality: N/A;   ESOPHAGOGASTRODUODENOSCOPY (EGD) WITH PROPOFOL N/A 05/07/2015   RMR: Status post dilation of normal esophagus. Gastritis.   IR CT HEAD LTD  12/17/2020   IR CT HEAD LTD  12/17/2020   IR INTRA CRAN STENT  12/17/2020   IR PERCUTANEOUS ART THROMBECTOMY/INFUSION INTRACRANIAL INC DIAG ANGIO  12/17/2020   IR RADIOLOGIST EVAL & MGMT  03/25/2021   KNEE SURGERY Left    arthroscopy   MANDIBLE FRACTURE SURGERY      POLYPECTOMY  05/07/2015   Procedure: POLYPECTOMY (Hepatic Flexure, Distal Transverse Colon, Rectal);  Surgeon: Melvin Dolin, MD;  [facility];  Service: Endoscopy;;   RADIOLOGY WITH ANESTHESIA N/A 12/16/2020   Procedure: IR WITH ANESTHESIA;  Surgeon: Melvin Bras, MD;  Location: [place];  Service: Radiology;  Laterality: N/A;   TEE WITHOUT CARDIOVERSION N/A 03/28/2016   Procedure: TRANSESOPHAGEAL ECHOCARDIOGRAM (TEE);  Surgeon: Melvin Pollack, MD;  Location: [place];  Service: Open Heart Surgery;  Laterality: N/A;   TONSILLECTOMY     TRIGGER FINGER RELEASE Left 10/15/2021   Procedure: RELEASE TRIGGER FINGER/A-1 PULLEY left middle or long finger;  Surgeon: Melvin Civil, MD;  Location[facility];  Service: Orthopedics;  Laterality:  Left;    Social History:  Social History   Socioeconomic History   Marital status: Single    Spouse name: Not on file   Number of children: Not on file   Years of education: 11th grade   Highest education level: Not on file  Occupational History   Occupation: unemployed    Employer: NOT EMPLOYED  Tobacco Use   Smoking status: Former    Packs/day: 0.50    Years: 30.00    Total pack years: 15.00    Types: Cigarettes   Smokeless tobacco: Never   Tobacco comments:    3 cigs per day  Vaping Use   Vaping Use: Never used  Substance and Sexual Activity   Alcohol use: Not Currently   Drug use: Not Currently    Types: Marijuana   Sexual activity: Not Currently  Other Topics Concern   Not on file  Social History Narrative   09/13/21 Lives alone, caregiver daily, manages meds   Drinks a cup of coffee a day   Social Determinants of Health   Financial Resource Strain: Not on file  Food Insecurity: Not on file  Transportation Needs: Not on file  Physical Activity: Not on file  Stress: Not on file  Social Connections: Not on file  Intimate Partner Violence: Not on file    Family History:  Family History  Problem Relation Age of Onset    Diabetes Father    Hypertension Father    Arthritis Other    Diabetes Other    Asthma Other    Stroke Sister     Medications:   Current Outpatient Medications on File Prior to Visit  Medication Sig Dispense Refill   albuterol (PROVENTIL HFA;VENTOLIN HFA) 108 (90 Base) MCG/ACT inhaler Inhale 2 puffs into the lungs every 4 (four) hours as needed for wheezing or shortness of breath.      amLODipine (NORVASC) 10 MG tablet Take 1 tablet (10 mg total) by mouth daily. 90 tablet 0   atorvastatin (LIPITOR) 80 MG tablet Take 1 tablet (80 mg total) by mouth at bedtime. 30 tablet 0   Blood Glucose Monitoring Suppl (ACCU-CHEK GUIDE ME) w/Device KIT 1 Piece by Does not apply route as directed. 1 kit 0   divalproex (DEPAKOTE) 500 MG DR tablet Take 2 tablets (1,000 mg total) by mouth 2 (two) times daily. 120 tablet 2   escitalopram (LEXAPRO) 20 MG tablet Take 20 mg by mouth daily.      glimepiride (AMARYL) 2 MG tablet TAKE 1 TABLET DAILY WITH BREAKFAST. 30 tablet 2   glucose blood (ACCU-CHEK GUIDE) test strip Monitor glucose 2 times a day. 150 each 2   isosorbide mononitrate (IMDUR) 30 MG 24 hr tablet Take 1 tablet (30 mg total) by mouth daily. 90 tablet 3   lamoTRIgine (LAMICTAL) 25 MG tablet Take 50 mg by mouth 2 (two) times daily.     LANTUS SOLOSTAR 100 UNIT/ML Solostar Pen INJECT 40 UNITS INTO THE SKIN AT BEDTIME. (Patient taking differently: Inject 5 Units into the skin at bedtime as needed (blood sugar above below 100; take with a snack).) 15 mL 0   Multiple Vitamin (MULTIVITAMIN WITH MINERALS) TABS tablet Take 1 tablet by mouth daily.     ticagrelor (BRILINTA) 90 MG TABS tablet Take 1 tablet (90 mg total) by mouth 2 (two) times daily. 60 tablet 5   Vitamin D, Ergocalciferol, (DRISDOL) 1.25 MG (50000 UNIT) CAPS capsule Take 50,000 Units by mouth every 7 (seven) days.  cycloSPORINE (RESTASIS) 0.05 % ophthalmic emulsion Place 1 drop into both eyes 2 (two) times daily. (Patient not taking: Reported  on 01/26/2022)     ferrous sulfate 325 (65 FE) MG tablet Take 325 mg by mouth daily. (Patient not taking: Reported on 01/26/2022)     hydrochlorothiazide (HYDRODIURIL) 25 MG tablet Take 1 tablet (25 mg total) by mouth daily. (Patient not taking: Reported on 01/26/2022) 90 tablet 3   nitroGLYCERIN (NITROSTAT) 0.4 MG SL tablet Place 1 tablet (0.4 mg total) under the tongue every 5 (five) minutes as needed for chest pain. 25 tablet 3   pantoprazole (PROTONIX) 40 MG tablet Take 1 tablet (40 mg total) by mouth daily. (Patient not taking: Reported on 01/26/2022)     No current facility-administered medications on file prior to visit.    Allergies:   Allergies  Allergen Reactions   Aspirin Swelling and Other (See Comments)    Facial swelling       OBJECTIVE:  Physical Exam  Vitals:   01/26/22 1019  BP: 112/76  Pulse: 68  Weight: 128 lb (58.1 kg)  Height: _0  (1.651 m)   Body mass index is 21.3 kg/m. No results found.  General: well developed, well nourished, pleasant middle-aged African-American male, seated, in no evident distress Head: head normocephalic and atraumatic.   Neck: supple with no carotid or supraclavicular bruits,  Cardiovascular: regular rate and rhythm, no murmurs Musculoskeletal: no deformity, neck tightness with rotation left and right with cervical paraspinal tenderness Skin:  no rash/petichiae Vascular:  Normal pulses all extremities   Neurologic Exam Mental Status: Awake although fatigued, yawning frequently throughout visit.  Fluent speech and language.  Oriented to place and time. Recent memory impaired and remote memory impaired.  Attention span, concentration and fund of knowledge diminished.  Mood and affect flat.  Cranial Nerves: Pupils equal, briskly reactive to light. Extraocular movements full without nystagmus. Visual fields full to confrontation. Hearing intact. Facial sensation intact. Face, tongue, palate moves normally and symmetrically.  Motor:  Difficulty fully testing strength in all extremities due to pain.  Despite this, strength relatively equal throughout except mild RLE hip flexor weakness Sensory.: intact to touch , pinprick , position and vibratory sensation.  Coordination: Rapid alternating movements normal in all extremities except slightly decreased right hand dexterity. Finger-to-nose and heel-to-shin right-sided incoordination.  Mild tremor noted in all extremities with activation.  No evidence of resting tremor or cogwheel rigidity Gait and Station: Arises from chair with mild difficulty. Stance is hunched at back and knees. Gait demonstrates wide-based gait with out-toeing and mild unsteadiness. No use of AD.  Tandem walking heel toe not attempted. Reflexes: 1+ and symmetric. Toes downgoing.         ASSESSMENT: BERK PILOT is a 56 y.o. year old male with left MCA infarct on 12/16/2020 due to left M1 occlusion s/p IR with stenting secondary to ICAD after presenting with right-sided weakness and slurred speech.  Vascular risk factors include HTN, HLD, DM, CAD s/p CABG, hx of substance abuse, prior stroke on imaging (R occipital lobe), OSA and seizures on Depakote and Lamictal.      PLAN:  L MCA stroke:  Referral placed to PT/OT for gait impairment, generalized pain and cervicalgia. Discussed importance of actively participating with therapies for optimal benefit for which patient agreed and wished to pursue. Continue to follow with Dr. Estanislado Pandy s/p M1 stenting Continue Brilinta (ticagrelor) 90 mg bid  and atorvastatin 80 mg daily for secondary stroke prevention.  Discussed secondary stroke prevention measures and importance of close PCP follow up for aggressive stroke risk factor management including BP goal<130/90, HLD with LDL goal<70 and DM with A1c.<7.  Repeat lipid panel and A1c today I have gone over the pathophysiology of stroke, warning signs and symptoms, risk factors and their management in some detail  with instructions to go to the closest emergency room for symptoms of concern.  Seizure disorder: On Depakote 558m BID followed by Dr. PLeta Taylor  No additional seizures.  Obtain Depakote level.  CBC/CMP per PCP. On lamotrigine for bipolar disorder  Excessive daytime fatigue: Likely multifactorial in setting of multiple comorbidities and polypharmacy.  Does have history of sleep apnea. Discussed reevaluation as prior study in 2014 with stroke 11/2020 and worsening daytime fatigue.  He was agreeable to proceed with reevaluation and possible treatment if indicated. Check B12 and TSH today.  Continue to follow with nephrology for anemia management.  Discussed increasing daytime activity with routine exercise and avoid daytime napping.  Cervicalgia, chronic: Advised to follow-up with PCP for further discussion and possible need of reevaluation by orthopedic/neurosurgery    Follow up in 6 months or call earlier if needed   CC:  PCP: Melvin Taylor    I spent 38 minutes of face-to-face and non-face-to-face time with patient and sister (via cell phone).  This included previsit chart review, lab review, study review, order entry, electronic health record documentation, patient and sister education regarding prior stroke and residual deficits, secondary stroke prevention measures and importance of managing stroke risk factors, seizure disorder and multiple other complaints as above and answered all other questions to patient and sisters satisfaction  Melvin Taylor AGNP-BC  [facility] [address] [id number] Phone [phone] [phone]: This document was prepared with digital dictation and possible smart phrase technology. Any transcriptional errors that result from this process are unintentional.

## 2022-01-26 ENCOUNTER — Ambulatory Visit: Payer: Medicaid Other | Admitting: Adult Health

## 2022-01-26 ENCOUNTER — Encounter: Payer: Self-pay | Admitting: Adult Health

## 2022-01-26 VITALS — BP 112/76 | HR 68 | Ht 65.0 in | Wt 128.0 lb

## 2022-01-26 DIAGNOSIS — G4733 Obstructive sleep apnea (adult) (pediatric): Secondary | ICD-10-CM | POA: Diagnosis not present

## 2022-01-26 DIAGNOSIS — G4710 Hypersomnia, unspecified: Secondary | ICD-10-CM | POA: Diagnosis not present

## 2022-01-26 DIAGNOSIS — R269 Unspecified abnormalities of gait and mobility: Secondary | ICD-10-CM

## 2022-01-26 DIAGNOSIS — I63512 Cerebral infarction due to unspecified occlusion or stenosis of left middle cerebral artery: Secondary | ICD-10-CM

## 2022-01-26 DIAGNOSIS — E1159 Type 2 diabetes mellitus with other circulatory complications: Secondary | ICD-10-CM

## 2022-01-26 DIAGNOSIS — M542 Cervicalgia: Secondary | ICD-10-CM

## 2022-01-26 DIAGNOSIS — G40219 Localization-related (focal) (partial) symptomatic epilepsy and epileptic syndromes with complex partial seizures, intractable, without status epilepticus: Secondary | ICD-10-CM | POA: Diagnosis not present

## 2022-01-26 DIAGNOSIS — I69398 Other sequelae of cerebral infarction: Secondary | ICD-10-CM

## 2022-01-26 DIAGNOSIS — E785 Hyperlipidemia, unspecified: Secondary | ICD-10-CM

## 2022-01-26 DIAGNOSIS — M2550 Pain in unspecified joint: Secondary | ICD-10-CM

## 2022-01-26 NOTE — Patient Instructions (Signed)
Referral placed to restart physical and occupational therapies at [facility]  Please follow up with your PCP regarding continued neck pain - you may need to get back in with ortho/neurosurgery for further evaluation   Referral placed for evaluation of underlying sleep apnea with prior history of apnea which can be contributing to your daytime fatigue  Continue depakote and lamictal - will check levels today  Continue  Brilinta   and atorvastatin for secondary stroke prevention  Continue to follow up with PCP regarding cholesterol, blood pressure and diabetes management  Maintain strict control of hypertension with blood pressure goal below 130/90, diabetes with hemoglobin A1c goal below 7.0 % and cholesterol with LDL cholesterol (bad cholesterol) goal below 70 mg/dL.   Signs of a Stroke? Follow the BEFAST method:  Balance Watch for a sudden loss of balance, trouble with coordination or vertigo Eyes Is there a sudden loss of vision in one or both eyes? Or double vision?  Face: Ask the person to smile. Does one side of the face droop or is it numb?  Arms: Ask the person to raise both arms. Does one arm drift downward? Is there weakness or numbness of a leg? Speech: Ask the person to repeat a simple phrase. Does the speech sound slurred/strange? Is the person confused ? Time: If you observe any of these signs, call 911.    Followup in the future with me in 6 months or call earlier if needed       Thank you for coming to see [place] at [facility]. I hope we have been able to provide you high quality care today.  You may receive a patient satisfaction survey over the next few weeks. We would appreciate your feedback and comments so that we may continue to improve ourselves and the health of our patients.

## 2022-01-27 ENCOUNTER — Ambulatory Visit (HOSPITAL_COMMUNITY): Payer: Medicaid Other | Attending: Adult Health

## 2022-01-27 LAB — THYROID PANEL WITH TSH
Free Thyroxine Index: 2.3 (ref 1.2–4.9)
T3 Uptake Ratio: 26 % (ref 24–39)
T4, Total: 8.9 ug/dL (ref 4.5–12.0)
TSH: 1.41 u[IU]/mL (ref 0.450–4.500)

## 2022-01-27 LAB — LIPID PANEL
Chol/HDL Ratio: 2.1 ratio (ref 0.0–5.0)
Cholesterol, Total: 141 mg/dL (ref 100–199)
HDL: 67 mg/dL (ref >39)
LDL Chol Calc (NIH): 56 mg/dL (ref 0–99)
Triglycerides: 98 mg/dL (ref 0–149)
VLDL Cholesterol Cal: 18 mg/dL (ref 5–40)

## 2022-01-27 LAB — HEMOGLOBIN A1C
Est. average glucose Bld gHb Est-mCnc: 105 mg/dL
Hgb A1c MFr Bld: 5.3 % (ref 4.8–5.6)

## 2022-01-27 LAB — VITAMIN B12: Vitamin B-12: 897 pg/mL (ref 232–1245)

## 2022-01-28 LAB — VALPROIC ACID LEVEL: Valproic Acid Lvl: 86 ug/mL (ref 50–100)

## 2022-01-28 LAB — LAMOTRIGINE LEVEL: Lamotrigine Lvl: 7.5 ug/mL (ref 2.0–20.0)

## 2022-02-03 ENCOUNTER — Encounter (HOSPITAL_COMMUNITY)
Admission: RE | Admit: 2022-02-03 | Discharge: 2022-02-03 | Disposition: A | Discharge status: Home / Self Care | Payer: Medicaid Other | Source: Ambulatory Visit | Attending: Nephrology | Admitting: Nephrology

## 2022-02-03 DIAGNOSIS — N289 Disorder of kidney and ureter, unspecified: Secondary | ICD-10-CM | POA: Insufficient documentation

## 2022-02-09 ENCOUNTER — Ambulatory Visit: Payer: Medicaid Other | Admitting: Podiatry

## 2022-02-11 ENCOUNTER — Ambulatory Visit (HOSPITAL_COMMUNITY): Payer: Medicaid Other

## 2022-03-08 ENCOUNTER — Other Ambulatory Visit: Payer: Self-pay | Admitting: "Endocrinology

## 2022-03-14 ENCOUNTER — Telehealth: Payer: Self-pay | Admitting: Neurology

## 2022-03-14 ENCOUNTER — Institutional Professional Consult (permissible substitution): Payer: Medicaid Other | Admitting: Neurology

## 2022-03-15 ENCOUNTER — Ambulatory Visit (HOSPITAL_COMMUNITY): Payer: Medicaid Other | Attending: Adult Health

## 2022-03-15 DIAGNOSIS — R29898 Other symptoms and signs involving the musculoskeletal system: Secondary | ICD-10-CM | POA: Diagnosis present

## 2022-03-15 DIAGNOSIS — M542 Cervicalgia: Secondary | ICD-10-CM | POA: Insufficient documentation

## 2022-03-15 DIAGNOSIS — M6281 Muscle weakness (generalized): Secondary | ICD-10-CM | POA: Insufficient documentation

## 2022-03-15 DIAGNOSIS — R2681 Unsteadiness on feet: Secondary | ICD-10-CM | POA: Diagnosis present

## 2022-03-15 DIAGNOSIS — I63512 Cerebral infarction due to unspecified occlusion or stenosis of left middle cerebral artery: Secondary | ICD-10-CM | POA: Insufficient documentation

## 2022-03-15 DIAGNOSIS — I69398 Other sequelae of cerebral infarction: Secondary | ICD-10-CM | POA: Diagnosis not present

## 2022-03-15 DIAGNOSIS — M2550 Pain in unspecified joint: Secondary | ICD-10-CM | POA: Insufficient documentation

## 2022-03-15 DIAGNOSIS — R269 Unspecified abnormalities of gait and mobility: Secondary | ICD-10-CM | POA: Diagnosis present

## 2022-03-15 DIAGNOSIS — I69359 Hemiplegia and hemiparesis following cerebral infarction affecting unspecified side: Secondary | ICD-10-CM | POA: Insufficient documentation

## 2022-03-15 DIAGNOSIS — R262 Difficulty in walking, not elsewhere classified: Secondary | ICD-10-CM | POA: Insufficient documentation

## 2022-03-15 NOTE — Therapy (Signed)
OUTPATIENT PHYSICAL THERAPY CERVICAL EVALUATION   Patient Name: Melvin Taylor MRN: [id number] DOB:Jan 17, 1966, 56 y.o., male Today's Date: 03/15/2022   PT End of Session - 03/15/22 1456     Visit Number 1    Number of Visits 8    Date for PT Re-Evaluation 04/12/22    Authorization Type Medicaid [place]    Authorization - Visit Number 0    Authorization - Number of Visits 3    Progress Note Due on Visit 3    PT Start Time 1300    PT Stop Time 1340    PT Time Calculation (min) 40 min             Past Medical History:  Diagnosis Date   Anxiety    Bipolar 1 disorder ([place])    Bone spur    left heel   Cervical radiculopathy    Chest pain    Chronic back pain    Chronic chest wall pain    Chronic neck pain    Coronary artery disease    a. s/p CABG in 03/2016 with LIMA-LAD, SVG-D1, SVG-RCA, and Seq SVG-mid and distal OM   Depression    Diabetes mellitus    Type II   Hallucinations    "long history of them"   Headache(784.0)    History of gout    HTN (hypertension)    Insomnia    Myocardial infarction ([place]) 2017   Neuropathy    Pain management    Polysubstance abuse ([place])    Right leg pain    chronic   Schizophrenia ([place])    Seizures ([place])    last sz between July 5-9th, 2016; epilepsy   Shortness of breath dyspnea    with exertion   Sleep apnea    Stroke ([place])    Transfusion of blood product refused for religious reason    Past Surgical History:  Procedure Laterality Date   ANTERIOR CERVICAL DECOMP/DISCECTOMY FUSION N/A 12/14/2015   Procedure: ANTERIOR CERVICAL DECOMPRESSION/DISCECTOMY FUSION CERVICAL FIVE -SIX;  Surgeon: Earnie Larsson, MD;  [facility]: [facility];  Service: Neurosurgery;  Laterality: N/A;   BIOPSY  05/07/2015   Procedure: BIOPSY (Gastric);  Surgeon: [name], MD;  Location: [facility];  Service: Endoscopy;;   CARDIAC CATHETERIZATION N/A 03/07/2016   Procedure: Left Heart Cath and Coronary Angiography;  Surgeon: Belva Crome, MD;   Location: [facility];  Service: Cardiovascular;  Laterality: N/A;   COLONOSCOPY WITH PROPOFOL N/A 05/07/2015   JQB:HALPFXTKWI diverticulosis, multiple colon polyps removed, tubular adenoma, serrated colon polyp. Next colonoscopy October 2019   COLONOSCOPY WITH PROPOFOL N/A 08/02/2018   Procedure: COLONOSCOPY WITH PROPOFOL;  Surgeon: [name], MD;  [facility]: [facility];  Service: Endoscopy;  Laterality: N/A;  12:00pm   CORONARY ARTERY BYPASS GRAFT N/A 03/28/2016   Procedure: CORONARY ARTERY BYPASS GRAFTING (CABG) x 5 USING GREATER SAPHENOUS VEIN;  Surgeon: [name], MD;  Location: [facility];  Service: Open Heart Surgery;  Laterality: N/A;   ENDOVEIN HARVEST OF GREATER SAPHENOUS VEIN Right 03/28/2016   Procedure: ENDOVEIN HARVEST OF GREATER SAPHENOUS VEIN;  Surgeon: [name], MD;  Location: [facility];  Service: Open Heart Surgery;  Laterality: Right;   ESOPHAGEAL DILATION N/A 05/07/2015   Procedure: ESOPHAGEAL DILATION WITH 56FR MALONEY DILATOR;  Surgeon: [name], MD;  Location: [facility];  Service: Endoscopy;  Laterality: N/A;   ESOPHAGOGASTRODUODENOSCOPY (EGD) WITH PROPOFOL N/A 05/07/2015   RMR: Status post dilation  of normal esophagus. Gastritis.   IR CT HEAD LTD  12/17/2020   IR CT HEAD LTD  12/17/2020   IR INTRA CRAN STENT  12/17/2020   IR PERCUTANEOUS ART THROMBECTOMY/INFUSION INTRACRANIAL INC DIAG ANGIO  12/17/2020   IR RADIOLOGIST EVAL & MGMT  03/25/2021   KNEE SURGERY Left    arthroscopy   MANDIBLE FRACTURE SURGERY     POLYPECTOMY  05/07/2015   Procedure: POLYPECTOMY (Hepatic Flexure, Distal Transverse Colon, Rectal);  Surgeon: [name], MD;  Location: [facility];  Service: Endoscopy;;   RADIOLOGY WITH ANESTHESIA N/A 12/16/2020   Procedure: IR WITH ANESTHESIA;  Surgeon: Luanne Bras, MD;  Location: [facility];  Service: Radiology;  Laterality: N/A;   TEE WITHOUT CARDIOVERSION N/A 03/28/2016   Procedure: TRANSESOPHAGEAL ECHOCARDIOGRAM (TEE);  Surgeon: [name], MD;   Location: [facility];  Service: Open Heart Surgery;  Laterality: N/A;   TONSILLECTOMY     TRIGGER FINGER RELEASE Left 10/15/2021   Procedure: RELEASE TRIGGER FINGER/A-1 PULLEY left middle or long finger;  Surgeon: Carole Civil, MD;  Location: [facility];  Service: Orthopedics;  Laterality: Left;   Patient Active Problem List   Diagnosis Date Noted   Trigger finger, left middle finger    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   Malnutrition of moderate degree 12/18/2020   Middle cerebral artery embolism, 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   Encounter for intubation    Renal insufficiency    Malignant hypertension    Acute ischemic left MCA stroke ([place]) 12/16/2020   Current smoker 06/21/2019   Seizure-like activity ([place]) 05/06/2019   Diabetes mellitus ([place]) 04/11/2019   Anemia, chronic disease 07/04/2018   Other specified diseases of the digestive system 10/30/2017   CAD (coronary artery disease) 03/28/2016   Chest pain 03/21/2016   Mixed hyperlipidemia 03/11/2016   Ischemic chest pain (HCC)    NSTEMI (non-ST elevated myocardial infarction) ([place])    Coronary atherosclerosis of native coronary artery 03/03/2016   Cervical spinal stenosis 12/14/2015   Loss of weight 08/25/2015   Essential hypertension, benign 07/23/2015   Personal history of noncompliance with medical treatment, presenting hazards to health 07/23/2015   Abdominal pain 06/08/2015   Constipation 06/08/2015   History of colonic polyps    Diverticulosis of colon without hemorrhage    Mucosal abnormality of stomach    Encounter for screening colonoscopy 04/16/2015   Dysphagia 04/16/2015   Partial symptomatic epilepsy with complex partial seizures, intractable, without status epilepticus ([place]) 11/10/2014   Bipolar disorder, unspecified ([place]) 10/15/2014   Schizophrenia ([place]) 10/15/2014   DM type 2 causing vascular disease ([place]) 06/29/2014   Musculoskeletal pain 06/29/2014   Hyperglycemia without ketosis 09/07/2013    Degeneration disease of medial meniscus 01/13/2011   Other internal derangements of unspecified knee 01/13/2011   Knee pain 12/28/2010   Medial meniscus, posterior horn derangement 12/28/2010    PCP: [name], FNP  REFERRING PROVIDER: [name], NP  REFERRING DIAG: [phone] (ICD-10-CM) - Cerebrovascular accident (CVA) due to stenosis of left middle cerebral artery (HCC) I69.398,R26.9 (ICD-10-CM) - Gait disturbance, post-stroke M25.50 (ICD-10-CM) - Multiple joint pain M54.2 (ICD-10-CM) - Cervicalgia   THERAPY DIAG:  Unsteadiness on feet  Neck pain  Abnormal gait  Other symptoms and signs involving the musculoskeletal system  Muscle weakness (generalized)  Difficulty in walking, not elsewhere classified  CVA, old, hemiparesis ([place])  Rationale for Evaluation and Treatment Rehabilitation  ONSET DATE: 1 year  SUBJECTIVE:  SUBJECTIVE STATEMENT: Patient states that he has neck pain ongoing; poor historian states he had neck surgery but aide denies. Lethargic during evaluation. Also has gait disturbance; reports fear of falling. Aide, Joe, with him today.  Stay with him every day mornings only.  POA is sister Estill Batten  PERTINENT HISTORY:  CVA 12/16/2020 Right side weakness from stroke Surgery in march trigger finger per Dr. Aline Brochure  PAIN:  Are you having pain? Yes: NPRS scale: 8/10 Pain location: right side and back of neck Pain description: aches Aggravating factors: unknown Relieving factors: unknown  PRECAUTIONS: Fall  WEIGHT BEARING RESTRICTIONS No  FALLS:  Has patient fallen in last 6 months? No patient states he fell but aide denies and then patient says he almost fell  LIVING ENVIRONMENT: Lives with: lives alone Lives in: House/apartment Stairs: Yes:  External: 10 steps; on right going up, on left going up, and can reach both uses elevator Has following equipment at home: Single point cane and Walker - 2 wheeled  OCCUPATION:disabled  PLOF: Needs assistance with ADLs and Needs assistance with homemaking  PATIENT GOALS less pain in the neck and walk better  OBJECTIVE:   DIAGNOSTIC FINDINGS:  CLINICAL DATA:  Right shoulder pain   EXAM: RIGHT SHOULDER - 2+ VIEW   COMPARISON:  None Available.   FINDINGS: No acute fracture or dislocation identified. Mild narrowing of the glenohumeral joint and acromioclavicular joint with early AC joint osteophytes.   IMPRESSION: Mild degenerative changes.     Electronically Signed   By: Ofilia Neas M.D.   On: 12/16/2021 16:05  CLINICAL DATA:  Left shoulder pain   EXAM: LEFT SHOULDER - 2+ VIEW   COMPARISON:  Left shoulder x-ray 05/10/2020   FINDINGS: There is no evidence of fracture or dislocation. There is no evidence of arthropathy or other focal bone abnormality. Soft tissues are unremarkable.   IMPRESSION: No acute osseous abnormality.        PATIENT SURVEYS:  NDI 39/50; 78%    COGNITION: Overall cognitive status: Impaired and History of cognitive impairments - at baseline   SENSATION: denies N/T   POSTURE: rounded shoulders and forward head  PALPATION: Right hand cooler than left Right upper trap tenderness   CERVICAL ROM:   Active ROM AROM (deg) eval  Flexion full  Extension Unable to get to neutral  Right lateral flexion   Left lateral flexion   Right rotation 12  Left rotation 30   (Blank rows = not tested)   UPPER EXTREMITY MMT:  MMT Right eval Left eval  Shoulder flexion 2-* 3-  Shoulder extension    Shoulder abduction    Shoulder adduction    Shoulder extension    Shoulder internal rotation    Shoulder external rotation    Middle trapezius 3- 3+  Lower trapezius    Elbow flexion 2- 4-  Elbow extension    Wrist flexion     Wrist extension    Wrist ulnar deviation    Wrist radial deviation    Wrist pronation    Wrist supination    Grip strength 3- 3+   (Blank rows = not tested)  FUNCTIONAL TESTS:  30 sec sit to stand x 0 TUG unable; needs min to mod assist to stand up   TODAY'S TREATMENT:  Physical therapy evaluation, HEP instruction   PATIENT EDUCATION:  Education details: Patient educated on exam findings, POC, scope of PT, HEP. Person educated: Patient Education method: Explanation, Demonstration, and Handouts Education comprehension: verbalized understanding, returned  demonstration, verbal cues required, and tactile cues required   HOME EXERCISE PROGRAM: Access Code: [email or web address] URL: [email or web address] Date: 03/15/2022 Prepared by: [facility]  Exercises - Seated Cervical Rotation AROM  - 2 x daily - 7 x weekly - 1 sets - 10 reps - Seated Cervical Flexion AROM  - 2 x daily - 7 x weekly - 1 sets - 10 reps - Seated Cervical Extension AROM  - 2 x daily - 7 x weekly - 1 sets - 10 reps - Seated Cervical Retraction  - 2 x daily - 7 x weekly - 1 sets - 10 reps  ASSESSMENT:  CLINICAL IMPRESSION: Patient is a 56 y.o. male who was seen today for physical therapy evaluation and treatment for neck pain; he also has a  history of CVA a little over a year ago with resultant right side weakness and gait instability all of which negatively impact his independence at home with daily tasks, bathing, grooming and dressing himself. Also presents with mild cognitive impairments; poor historian. Patient will benefit from continued skilled therapy interventions to address deficits and promote return to optimal function.    OBJECTIVE IMPAIRMENTS Abnormal gait, decreased activity tolerance, decreased balance, decreased coordination, decreased endurance, decreased knowledge of condition, decreased knowledge of use of DME, decreased mobility, difficulty walking, decreased ROM, decreased strength,  decreased safety awareness, hypomobility, impaired perceived functional ability, impaired flexibility, impaired UE functional use, and pain.   ACTIVITY LIMITATIONS carrying, lifting, bending, sitting, standing, squatting, sleeping, stairs, transfers, bed mobility, bathing, toileting, dressing, reach over head, hygiene/grooming, and locomotion level  PARTICIPATION LIMITATIONS: meal prep, cleaning, laundry, medication management, personal finances, driving, shopping, community activity, occupation, and yard work  [employer] POTENTIAL: Good  CLINICAL DECISION MAKING: Stable/uncomplicated  EVALUATION COMPLEXITY: Low   GOALS: Goals reviewed with patient? No  SHORT TERM GOALS: Target date: 03/29/2022    patient will be independent with initial HEP Baseline:  Goal status: INITIAL  2.  Patient will be able to perform sit to stand without assistance Baseline: min to mod Asssitance Goal status: INITIAL  LONG TERM GOALS: Target date: 04/12/2022    Patient will be independent with advanced HEP and self management strategies to improve quality of life and functional outcomes.    Baseline:  Goal status: INITIAL  2.  Patient will improve NDI score to 30/50 to demonstrate improved functional mobility of the cervical spine Baseline: 39/50 Goal status: INITIAL  3.  Patient will improve cervical extension to 20% available or better to be able to look up when sitting to see someone walking into his home. Baseline: to neutral Goal status: INITIAL  4.  Patient will report at least 50% improvement in overall symptoms and/or function to demonstrate improved functional mobility  Baseline:  Goal status: INITIAL   PLAN: PT FREQUENCY: 2x/week  PT DURATION: 4 weeks  PLANNED INTERVENTIONS: Therapeutic exercises, Therapeutic activity, Neuromuscular re-education, Balance training, Gait training, Patient/Family education, Joint manipulation, Joint mobilization, Stair training, Orthotic/Fit training, DME  instructions, Aquatic Therapy, Dry Needling, Electrical stimulation, Spinal manipulation, Spinal mobilization, Cryotherapy, Moist heat, Compression bandaging, scar mobilization, Splintting, Taping, Traction, Ultrasound, Ionotophoresis '4mg'$ /ml Dexamethasone, and Manual therapy  PLAN FOR NEXT SESSION: Review of HEP and goals.   3:10 PM, 03/15/22 Babette Stum Small Coleston Dirosa MPT [facility] [phone]

## 2022-03-16 ENCOUNTER — Encounter: Payer: Self-pay | Admitting: Podiatry

## 2022-03-16 ENCOUNTER — Ambulatory Visit (INDEPENDENT_AMBULATORY_CARE_PROVIDER_SITE_OTHER): Payer: Medicaid Other | Admitting: Podiatry

## 2022-03-16 DIAGNOSIS — M79675 Pain in left toe(s): Secondary | ICD-10-CM | POA: Diagnosis not present

## 2022-03-16 DIAGNOSIS — E1142 Type 2 diabetes mellitus with diabetic polyneuropathy: Secondary | ICD-10-CM

## 2022-03-16 DIAGNOSIS — B351 Tinea unguium: Secondary | ICD-10-CM

## 2022-03-16 DIAGNOSIS — M79674 Pain in right toe(s): Secondary | ICD-10-CM

## 2022-03-16 NOTE — Progress Notes (Signed)
This patient returns to my office for at risk foot care.  This patient requires this care by a professional since this patient will be at risk due to having diabetic neuropathy.  This patient is unable to cut nails himself since the patient cannot reach his nails.These nails are painful walking and wearing shoes.  This patient presents for at risk foot care today.  General Appearance  Alert, conversant and in no acute stress.  Vascular  Dorsalis pedis and posterior tibial  pulses are palpable  bilaterally.  Capillary return is within normal limits  bilaterally. Temperature is within normal limits  bilaterally.  Neurologic  Senn-Weinstein monofilament wire test within normal limits  bilaterally. Muscle power within normal limits bilaterally.  Nails Thick disfigured discolored nails with subungual debris  from hallux to fifth toes bilaterally. No evidence of bacterial infection or drainage bilaterally.  Orthopedic  No limitations of motion  feet .  No crepitus or effusions noted.  No bony pathology or digital deformities noted.  Skin  normotropic skin with no porokeratosis noted bilaterally.  No signs of infections or ulcers noted.     Onychomycosis  Pain in right toes  Pain in left toes  Consent was obtained for treatment procedures.   Mechanical debridement of nails 1-5  bilaterally performed with a nail nipper.  Filed with dremel without incident.    Return office visit   4 months                   Told patient to return for periodic foot care and evaluation due to potential at risk complications.   Philopateer Strine DPM   

## 2022-04-19 ENCOUNTER — Encounter: Payer: Self-pay | Admitting: Neurology

## 2022-04-19 ENCOUNTER — Ambulatory Visit: Payer: Medicaid Other | Admitting: Neurology

## 2022-04-19 VITALS — BP 103/71 | HR 66 | Ht 67.0 in | Wt 121.6 lb

## 2022-04-19 DIAGNOSIS — Z8669 Personal history of other diseases of the nervous system and sense organs: Secondary | ICD-10-CM

## 2022-04-19 DIAGNOSIS — R634 Abnormal weight loss: Secondary | ICD-10-CM | POA: Diagnosis not present

## 2022-04-19 DIAGNOSIS — Z8673 Personal history of transient ischemic attack (TIA), and cerebral infarction without residual deficits: Secondary | ICD-10-CM

## 2022-04-19 DIAGNOSIS — R0683 Snoring: Secondary | ICD-10-CM | POA: Diagnosis not present

## 2022-04-19 NOTE — Progress Notes (Signed)
Subjective:    Patient ID: Melvin Taylor is a 56 y.o. male.  HPI    Star Age, MD, PhD [facility] [address] [place], [place] [address]  Dear Janett Billow and Bonnita Levan,   I saw your patient, Melvin Taylor, upon your kind request in the sleep clinic today for initial consultation of his sleep disorder, in particular, concern for underlying obstructive sleep apnea.  The patient is unaccompanied today.  As you know, Melvin Taylor is a 56 year old right-handed gentleman with underlying complex medical history of seizure disorder, mood disorder, stroke, hypertension, diabetes, coronary artery disease with history of MI, history of substance use disorder (by chart review), chronic back pain, cervical radiculopathy, and hyperlipidemia, who was previously diagnosed with obstructive sleep apnea.  He does not currently use a CPAP machine, has not used her machine in over 2 years.  Prior diagnostic sleep study results are not available for my review today.  Testing was in or around 2014.  He did have a titration study on 05/27/2013 and I was able to review the results.  Study was interpreted by Dr. Phillips Odor and he was titrated to a pressure of 7 cm.  I do not have a compliance download for review today.  He reports that originally his sleep apnea was severe but then he was told he had mild sleep apnea.  He has had quite a bit of weight loss, at the time of his titration study his weight was listed as 200 pounds and BMI at 32, current weight is 121 pounds and BMI of 19.  He is single and lives alone.  He goes to bed around 1 and rise time is around 6.  He quit smoking about 2 years ago and has not used any illicit drugs in 2 years.  He does not drink any alcohol currently.  He drinks caffeine in the form of sweet tea, 1 or 2 servings per day on average.  He denies night to night nocturia, has had occasional morning headaches. I reviewed your office note from  01/26/2022.  His Epworth sleepiness score is 10 out of 24, fatigue severity score is 27 out of 63.  His Past Medical History Is Significant For: Past Medical History:  Diagnosis Date   Anxiety    Bipolar 1 disorder ([place])    Bone spur    left heel   Cervical radiculopathy    Chest pain    Chronic back pain    Chronic chest wall pain    Chronic neck pain    Coronary artery disease    a. s/p CABG in 03/2016 with LIMA-LAD, SVG-D1, SVG-RCA, and Seq SVG-mid and distal OM   Depression    Diabetes mellitus    Type II   Hallucinations    "long history of them"   Headache(784.0)    History of gout    HTN (hypertension)    Insomnia    Myocardial infarction ([place]) 2017   Neuropathy    Pain management    Polysubstance abuse ([place])    Right leg pain    chronic   Schizophrenia ([place])    Seizures ([place])    last sz between July 5-9th, 2016; epilepsy   Shortness of breath dyspnea    with exertion   Sleep apnea    Stroke [facility])    Transfusion of blood product refused for religious reason     His Past Surgical History Is Significant For: Past Surgical History:  Procedure Laterality Date   ANTERIOR CERVICAL DECOMP/DISCECTOMY FUSION N/A 12/14/2015   Procedure: ANTERIOR CERVICAL DECOMPRESSION/DISCECTOMY FUSION CERVICAL FIVE -SIX;  Surgeon: Earnie Larsson, MD;  Location: [facility];  Service: Neurosurgery;  Laterality: N/A;   BIOPSY  05/07/2015   Procedure: BIOPSY (Gastric);  Surgeon: [name], MD;  Location: [facility];  Service: Endoscopy;;   CARDIAC CATHETERIZATION N/A 03/07/2016   Procedure: Left Heart Cath and Coronary Angiography;  Surgeon: Belva Crome, MD;  Location: [facility];  Service: Cardiovascular;  Laterality: N/A;   COLONOSCOPY WITH PROPOFOL N/A 05/07/2015   NKN:[id number] diverticulosis, multiple colon polyps removed, tubular adenoma, serrated colon polyp. Next colonoscopy October 2019   COLONOSCOPY WITH PROPOFOL N/A 08/02/2018   Procedure: COLONOSCOPY WITH PROPOFOL;   Surgeon: [name], MD;  [facility];  Service: Endoscopy;  Laterality: N/A;  12:00pm   CORONARY ARTERY BYPASS GRAFT N/A 03/28/2016   Procedure: CORONARY ARTERY BYPASS GRAFTING (CABG) x 5 USING GREATER SAPHENOUS VEIN;  Surgeon: [name], MD;  Location: [facility] OR;  Service: Open Heart Surgery;  Laterality: N/A;   ENDOVEIN HARVEST OF GREATER SAPHENOUS VEIN Right 03/28/2016   Procedure: ENDOVEIN HARVEST OF GREATER SAPHENOUS VEIN;  Surgeon: [name], MD;  Location: [facility];  Service: Open Heart Surgery;  Laterality: Right;   ESOPHAGEAL DILATION N/A 05/07/2015   Procedure: ESOPHAGEAL DILATION WITH 56FR MALONEY DILATOR;  Surgeon: [name], MD;  Location: [facility];  Service: Endoscopy;  Laterality: N/A;   ESOPHAGOGASTRODUODENOSCOPY (EGD) WITH PROPOFOL N/A 05/07/2015   RMR: Status post dilation of normal esophagus. Gastritis.   IR CT HEAD LTD  12/17/2020   IR CT HEAD LTD  12/17/2020   IR INTRA CRAN STENT  12/17/2020   IR PERCUTANEOUS ART THROMBECTOMY/INFUSION INTRACRANIAL INC DIAG ANGIO  12/17/2020   IR RADIOLOGIST EVAL & MGMT  03/25/2021   KNEE SURGERY Left    arthroscopy   MANDIBLE FRACTURE SURGERY     POLYPECTOMY  05/07/2015   Procedure: POLYPECTOMY (Hepatic Flexure, Distal Transverse Colon, Rectal);  Surgeon: [name], MD;  [facility];  Service: Endoscopy;;   RADIOLOGY WITH ANESTHESIA N/A 12/16/2020   Procedure: IR WITH ANESTHESIA;  Surgeon: Luanne Bras, MD;  Location: [facility];  Service: Radiology;  Laterality: N/A;   TEE WITHOUT CARDIOVERSION N/A 03/28/2016   Procedure: TRANSESOPHAGEAL ECHOCARDIOGRAM (TEE);  Surgeon: [name], MD;  Location: [facility];  Service: Open Heart Surgery;  Laterality: N/A;   TONSILLECTOMY     TRIGGER FINGER RELEASE Left 10/15/2021   Procedure: RELEASE TRIGGER FINGER/A-1 PULLEY left middle or long finger;  Surgeon: Carole Civil, MD;  [facility];  Service: Orthopedics;  Laterality: Left;    His Family History Is  Significant For: Family History  Problem Relation Age of Onset   Diabetes Father    Hypertension Father    Sleep apnea Father    Stroke Sister    Arthritis Other    Diabetes Other    Asthma Other     His Social History Is Significant For: Social History   Socioeconomic History   Marital status: Single    Spouse name: Not on file   Number of children: Not on file   Years of education: 11th grade   Highest education level: Not on file  Occupational History   Occupation: unemployed    Employer: NOT EMPLOYED  Tobacco Use   Smoking status: Former    Packs/day: 0.50    Years: 30.00  Total pack years: 15.00    Types: Cigarettes   Smokeless tobacco: Never   Tobacco comments:    3 cigs per day  Vaping Use   Vaping Use: Never used  Substance and Sexual Activity   Alcohol use: Not Currently   Drug use: Not Currently    Types: Marijuana   Sexual activity: Not Currently  Other Topics Concern   Not on file  Social History Narrative   09/13/21 Lives alone, caregiver daily, manages meds   Drinks a cup of coffee a day   Social Determinants of Health   Financial Resource Strain: Not on file  Food Insecurity: Not on file  Transportation Needs: Not on file  Physical Activity: Not on file  Stress: Not on file  Social Connections: Not on file    His Allergies Are:  Allergies  Allergen Reactions   Aspirin Swelling and Other (See Comments)    Facial swelling   :   His Current Medications Are:  Outpatient Encounter Medications as of 04/19/2022  Medication Sig   albuterol (PROVENTIL HFA;VENTOLIN HFA) 108 (90 Base) MCG/ACT inhaler Inhale 2 puffs into the lungs every 4 (four) hours as needed for wheezing or shortness of breath.    amLODipine (NORVASC) 10 MG tablet Take 1 tablet (10 mg total) by mouth daily.   atorvastatin (LIPITOR) 80 MG tablet Take 1 tablet (80 mg total) by mouth at bedtime.   Blood Glucose Monitoring Suppl (ACCU-CHEK GUIDE ME) w/Device KIT 1 Piece by Does  not apply route as directed.   cycloSPORINE (RESTASIS) 0.05 % ophthalmic emulsion Place 1 drop into both eyes 2 (two) times daily.   divalproex (DEPAKOTE) 500 MG DR tablet Take 2 tablets (1,000 mg total) by mouth 2 (two) times daily.   escitalopram (LEXAPRO) 20 MG tablet Take 20 mg by mouth daily.    ferrous sulfate 325 (65 FE) MG tablet Take 325 mg by mouth daily.   glimepiride (AMARYL) 2 MG tablet TAKE 1 TABLET DAILY WITH BREAKFAST.   glucose blood (ACCU-CHEK GUIDE) test strip Monitor glucose 2 times a day.   hydrochlorothiazide (HYDRODIURIL) 25 MG tablet Take 1 tablet (25 mg total) by mouth daily.   isosorbide mononitrate (IMDUR) 30 MG 24 hr tablet Take 1 tablet (30 mg total) by mouth daily.   lamoTRIgine (LAMICTAL) 25 MG tablet Take 50 mg by mouth 2 (two) times daily.   LANTUS SOLOSTAR 100 UNIT/ML Solostar Pen INJECT 40 UNITS INTO THE SKIN AT BEDTIME. (Patient taking differently: Inject 5 Units into the skin at bedtime as needed (blood sugar above below 100; take with a snack).)   Multiple Vitamin (MULTIVITAMIN WITH MINERALS) TABS tablet Take 1 tablet by mouth daily.   pantoprazole (PROTONIX) 40 MG tablet Take 1 tablet (40 mg total) by mouth daily.   ticagrelor (BRILINTA) 90 MG TABS tablet Take 1 tablet (90 mg total) by mouth 2 (two) times daily.   Vitamin D, Ergocalciferol, (DRISDOL) 1.25 MG (50000 UNIT) CAPS capsule Take 50,000 Units by mouth every 7 (seven) days.   nitroGLYCERIN (NITROSTAT) 0.4 MG SL tablet Place 1 tablet (0.4 mg total) under the tongue every 5 (five) minutes as needed for chest pain.   No facility-administered encounter medications on file as of 04/19/2022.  :   Review of Systems:  Out of a complete 14 point review of systems, all are reviewed and negative with the exception of these symptoms as listed below:   Review of Systems  Neurological:  Pt here for sleep consult  Pt snores,fatigue,headaches,hypertension  ,hx stroke Pt had a sleep study done 2014  Pt  hasn't used CPAP machine in over 2 years . Pt states he was told sleep apnea was not bad .    ESS:10 FSS:27     Objective:  Neurological Exam  Physical Exam Physical Examination:   Vitals:   04/19/22 1120  BP: 103/71  Pulse: 66    General Examination: The patient is a very pleasant 56 y.o. male in no acute distress.   HEENT: Normocephalic, atraumatic, pupils are equal, round and reactive to light, extraocular tracking is mildly impaired, face is symmetric.  Speech is clear but scant.  Neck without limitation of movement, no carotid bruits.  Airway examination reveals mild to moderate mouth dryness, edentulous, status post uvulectomy, status post tonsillectomy.  Neck circumference 14-1/4 inches.    Chest: Clear to auscultation without wheezing, rhonchi or crackles noted.  Heart: S1+S2+0, regular and normal without murmurs, rubs or gallops noted.   Abdomen: Soft, non-tender and non-distended.  Extremities: There is no obvious edema in the distal lower extremities bilaterally.   Skin: Warm and dry without trophic changes noted.   Musculoskeletal: exam reveals no obvious joint deformities.   Neurologically:  Mental status: The patient is awake, alert and oriented in all 4 spheres. He is slow in responding.   Cranial nerves II - XII are as described above under HEENT exam.  Motor exam: Thin bulk, global strength of 4 out of 5, more weaker on the right hemibody.  Impaired fine motor skills.   Cerebellar testing: No dysmetria or intention tremor. There is no truncal or gait ataxia.  Sensory exam: intact to light touch in the upper and lower extremities.  Gait, station and balance: He stands with difficulty, he walks with a cane on the right side, mild limp.   Assessment and Plan:  In summary, Melvin Taylor is a very pleasant 56 y.o.-year old male with underlying complex medical history of seizure disorder, mood disorder, stroke, hypertension, diabetes, coronary artery  disease with history of MI, history of substance use disorder (by chart review), chronic back pain, cervical radiculopathy, and hyperlipidemia, who presents for evaluation of his prior diagnosis of obstructive sleep apnea.  He has a CPAP machine but has not used it in at least 2 years.  He has had quite a bit of weight loss over the past 9 years.  We will reassess with a sleep study.  He is willing to proceed, he would be willing to consider CPAP therapy again if the need arises.  He would prefer an in-lab sleep study which I have ordered.  We will pick up our discussion about the next steps and treatment options after testing.  We will keep him posted as to the test results by phone call and/or MyChart messaging where possible.  We will plan to follow-up in sleep clinic accordingly as well.  I answered all his questions today and the patient was in agreement.   I encouraged him to call with any interim questions, concerns, problems or updates or email [place] through [place].  Generally speaking, sleep test authorizations may take up to 2 weeks, sometimes less, sometimes longer, the patient is encouraged to get in touch with [place] if they do not hear back from the sleep lab staff directly within the next 2 weeks.  Thank you very much for allowing me to participate in the care of this nice patient. If I can be  of any further assistance to you please do not hesitate to call me at [phone].  Sincerely,   Star Age, MD, PhD

## 2022-04-19 NOTE — Patient Instructions (Signed)
  Based on your symptoms and your exam I believe you may still be at risk for obstructive sleep apnea (aka OSA). We should proceed with a sleep study to determine whether you do or do not have OSA and how severe it is. Even, if you have mild OSA, I may want you to consider treatment with CPAP, as treatment of even borderline or mild sleep apnea can result and improvement of symptoms such as sleep disruption, daytime sleepiness, nighttime bathroom breaks, restless leg symptoms, improvement of headache syndromes, even improved mood disorder.   As explained, an attended sleep study (meaning you get to stay overnight in the sleep lab), lets [place] monitor sleep-related behaviors such as sleep talking and leg movements in sleep, in addition to monitoring for sleep apnea.  A home sleep test is a screening tool for sleep apnea diagnosis only, but unfortunately, does not help with any other sleep-related diagnoses.  Please remember, the long-term risks and ramifications of untreated moderate to severe obstructive sleep apnea may include (but are not limited to): increased risk for cardiovascular disease, including congestive heart failure, stroke, difficult to control hypertension, treatment resistant obesity, arrhythmias, especially irregular heartbeat commonly known as A. Fib. (atrial fibrillation); even type 2 diabetes has been linked to untreated OSA.   Other correlations that untreated obstructive sleep apnea include macular edema which is swelling of the retina in the eyes, droopy eyelid syndrome, and elevated hemoglobin and hematocrit levels (often referred to as polycythemia).  Sleep apnea can cause disruption of sleep and sleep deprivation in most cases, which, in turn, can cause recurrent headaches, problems with memory, mood, concentration, focus, and vigilance. Most people with untreated sleep apnea report excessive daytime sleepiness, which can affect their ability to drive. Please do not drive or use heavy  equipment or machinery, if you feel sleepy! Patients with sleep apnea can also develop difficulty initiating and maintaining sleep (aka insomnia).   Having sleep apnea may increase your risk for other sleep disorders, including involuntary behaviors sleep such as sleep terrors, sleep talking, sleepwalking.    Having sleep apnea can also increase your risk for restless leg syndrome and leg movements at night.   Please note that untreated obstructive sleep apnea may carry additional perioperative morbidity. Patients with significant obstructive sleep apnea (typically, in the moderate to severe degree) should receive, if possible, perioperative PAP (positive airway pressure) therapy and the surgeons and particularly the anesthesiologists should be informed of the diagnosis and the severity of the sleep disordered breathing.   We will call you or email you through [place] with regards to your test results and plan a follow-up in sleep clinic accordingly. Most likely, you will hear from one of our nurses.   Our sleep lab administrative assistant will call you to schedule your sleep study and give you further instructions, regarding the check in process for the sleep study, arrival time, what to bring, when you can expect to leave after the study, etc., and to answer any other logistical questions you may have. If you don't hear back from her by about 2 weeks from now, please feel free to call her direct line at [phone] or you can call our general clinic number, or email [place] through My Chart.

## 2022-04-28 ENCOUNTER — Encounter (HOSPITAL_COMMUNITY)
Admission: RE | Admit: 2022-04-28 | Discharge: 2022-04-28 | Disposition: A | Discharge status: Home / Self Care | Payer: Medicaid Other | Source: Ambulatory Visit | Attending: Nephrology | Admitting: Nephrology

## 2022-04-28 DIAGNOSIS — D631 Anemia in chronic kidney disease: Secondary | ICD-10-CM | POA: Diagnosis not present

## 2022-04-28 DIAGNOSIS — N1832 Chronic kidney disease, stage 3b: Secondary | ICD-10-CM | POA: Insufficient documentation

## 2022-04-28 DIAGNOSIS — N289 Disorder of kidney and ureter, unspecified: Secondary | ICD-10-CM

## 2022-04-28 LAB — POCT HEMOGLOBIN-HEMACUE: Hemoglobin: 7.9 g/dL — ABNORMAL LOW (ref 13.0–17.0)

## 2022-04-28 MED ORDER — EPOETIN ALFA 3000 UNIT/ML IJ SOLN
INTRAMUSCULAR | Status: AC
  Filled 2022-04-28: qty 1

## 2022-04-28 MED ORDER — EPOETIN ALFA 3000 UNIT/ML IJ SOLN
3000.0000 [IU] | Freq: Once | INTRAMUSCULAR | Status: AC
  Administered 2022-04-28: 3000 [IU] via SUBCUTANEOUS

## 2022-04-28 NOTE — Progress Notes (Signed)
1341-Hgb 7.9 today. Last result 9.4 on 01/20/22. Thayer Headings RN at Dr Toya Smothers office notified. No new orders given. Instructed to proceed with procrit 3000. When pt returns on 05/12/22, call her with results of Hgb to see if procrit needs to be increased. Note on chart.

## 2022-05-12 ENCOUNTER — Encounter (HOSPITAL_COMMUNITY): Payer: Medicaid Other

## 2022-05-16 ENCOUNTER — Telehealth: Payer: Self-pay | Admitting: Adult Health

## 2022-05-16 NOTE — Telephone Encounter (Addendum)
Contacted sister back, discussed Dr Doristine Section recommendations. She still thinks he is taking too much medication.  He is eating fine, have a aide to come in the house to assist patient. I advised to encourage pt to try to stay up and active during the day, do activities and get out of the house.   Pt sister is requesting a call from sleep lab on scheduling pt sleep study. Please advise .

## 2022-05-16 NOTE — Telephone Encounter (Signed)
Fatigue issue has been ongoing for a while now and as discussed at recent visit, likely multifactorial.  At prior visit, we discussed his sister decreasing Depakote dose to '500mg'$  twice daily due to fatigue concern but denied any improvement, I would not recommend further adjusting but can have Dr. Leta Baptist weigh in on this as he treats patients seizures. He was also seen by Dr. Rexene Alberts recently for hx of OSA, is awaiting to undergo sleep study.

## 2022-05-16 NOTE — Telephone Encounter (Signed)
Pt sister [name] is calling. Stated she would like to talk to nurse about seizure medication. [name] is requesting a call back from nurse.

## 2022-05-16 NOTE — Telephone Encounter (Signed)
Do you have any suggestions?

## 2022-05-16 NOTE — Telephone Encounter (Signed)
Contacted sister back, she stated he sleeps 10-12 hrs a day. He's down 120lbs. She wantes to rq reevaluation of his sz medication, she Thinks he is taking too much. Hasn't had sz since last yr. What do you advise ?

## 2022-05-17 ENCOUNTER — Ambulatory Visit (HOSPITAL_COMMUNITY)
Admission: RE | Admit: 2022-05-17 | Discharge: 2022-05-17 | Disposition: A | Discharge status: Home / Self Care | Payer: Medicaid Other | Source: Ambulatory Visit | Attending: Nephrology | Admitting: Nephrology

## 2022-05-17 ENCOUNTER — Encounter (HOSPITAL_COMMUNITY): Payer: Self-pay

## 2022-05-17 VITALS — BP 99/78 | HR 67 | Temp 97.9°F | Resp 18 | Ht 67.0 in | Wt 121.7 lb

## 2022-05-17 DIAGNOSIS — D631 Anemia in chronic kidney disease: Secondary | ICD-10-CM | POA: Insufficient documentation

## 2022-05-17 DIAGNOSIS — N1832 Chronic kidney disease, stage 3b: Secondary | ICD-10-CM | POA: Insufficient documentation

## 2022-05-17 MED ORDER — EPOETIN ALFA 3000 UNIT/ML IJ SOLN
INTRAMUSCULAR | Status: AC
  Filled 2022-05-17: qty 1

## 2022-05-17 MED ORDER — EPOETIN ALFA 3000 UNIT/ML IJ SOLN
3000.0000 [IU] | Freq: Once | INTRAMUSCULAR | Status: AC
  Administered 2022-05-17: 3000 [IU] via SUBCUTANEOUS

## 2022-05-18 NOTE — Telephone Encounter (Signed)
[name] Sigmund Hazel said have been waiting since September 19. . She feels that pt is over medicated from medication for seizures. Would like a call back.

## 2022-05-18 NOTE — Telephone Encounter (Signed)
LVM for pt sister Estill Batten on [place] to call back   [place] no auth req

## 2022-05-19 NOTE — Telephone Encounter (Signed)
Alecia returned my call.Marland Kitchen  [employer]- Medicaid no auth req  Patient is scheduled at [facility] for 06/17/22 at 9 pm.  Estill Batten requested that I mail her the packet of information.

## 2022-05-25 LAB — POCT HEMOGLOBIN-HEMACUE: Hemoglobin: 8.3 g/dL — ABNORMAL LOW (ref 13.0–17.0)

## 2022-05-31 ENCOUNTER — Encounter (HOSPITAL_COMMUNITY)
Admission: RE | Admit: 2022-05-31 | Discharge: 2022-05-31 | Disposition: A | Discharge status: Home / Self Care | Payer: Medicaid Other | Source: Ambulatory Visit | Attending: Family Medicine | Admitting: Family Medicine

## 2022-05-31 VITALS — BP 114/77 | HR 57 | Temp 98.6°F | Resp 16

## 2022-05-31 DIAGNOSIS — N1832 Chronic kidney disease, stage 3b: Secondary | ICD-10-CM | POA: Insufficient documentation

## 2022-05-31 DIAGNOSIS — D631 Anemia in chronic kidney disease: Secondary | ICD-10-CM | POA: Insufficient documentation

## 2022-05-31 LAB — POCT HEMOGLOBIN-HEMACUE: Hemoglobin: 8.1 g/dL — ABNORMAL LOW (ref 13.0–17.0)

## 2022-05-31 MED ORDER — EPOETIN ALFA 3000 UNIT/ML IJ SOLN
3000.0000 [IU] | Freq: Once | INTRAMUSCULAR | Status: AC
  Administered 2022-05-31: 3000 [IU] via SUBCUTANEOUS

## 2022-05-31 MED ORDER — EPOETIN ALFA 3000 UNIT/ML IJ SOLN
INTRAMUSCULAR | Status: AC
  Filled 2022-05-31: qty 1

## 2022-06-06 DIAGNOSIS — N1832 Chronic kidney disease, stage 3b: Secondary | ICD-10-CM | POA: Insufficient documentation

## 2022-06-06 DIAGNOSIS — N1831 Chronic kidney disease, stage 3a: Secondary | ICD-10-CM | POA: Insufficient documentation

## 2022-06-15 ENCOUNTER — Encounter (HOSPITAL_COMMUNITY)
Admission: RE | Admit: 2022-06-15 | Discharge: 2022-06-15 | Disposition: A | Discharge status: Home / Self Care | Payer: Medicaid Other | Source: Ambulatory Visit | Attending: Family Medicine | Admitting: Family Medicine

## 2022-06-15 DIAGNOSIS — D631 Anemia in chronic kidney disease: Secondary | ICD-10-CM | POA: Insufficient documentation

## 2022-06-15 DIAGNOSIS — N1832 Chronic kidney disease, stage 3b: Secondary | ICD-10-CM | POA: Insufficient documentation

## 2022-06-17 ENCOUNTER — Ambulatory Visit (INDEPENDENT_AMBULATORY_CARE_PROVIDER_SITE_OTHER): Payer: Medicaid Other | Admitting: Neurology

## 2022-06-17 DIAGNOSIS — G4720 Circadian rhythm sleep disorder, unspecified type: Secondary | ICD-10-CM

## 2022-06-17 DIAGNOSIS — Z8669 Personal history of other diseases of the nervous system and sense organs: Secondary | ICD-10-CM

## 2022-06-17 DIAGNOSIS — G478 Other sleep disorders: Secondary | ICD-10-CM | POA: Diagnosis not present

## 2022-06-17 DIAGNOSIS — R634 Abnormal weight loss: Secondary | ICD-10-CM

## 2022-06-17 DIAGNOSIS — Z8673 Personal history of transient ischemic attack (TIA), and cerebral infarction without residual deficits: Secondary | ICD-10-CM

## 2022-06-17 DIAGNOSIS — R0683 Snoring: Secondary | ICD-10-CM

## 2022-06-21 ENCOUNTER — Encounter (HOSPITAL_COMMUNITY)
Admission: RE | Admit: 2022-06-21 | Discharge: 2022-06-21 | Disposition: A | Discharge status: Home / Self Care | Payer: Medicaid Other | Source: Ambulatory Visit | Attending: Family Medicine | Admitting: Family Medicine

## 2022-06-21 VITALS — BP 105/71 | HR 58 | Temp 97.5°F | Resp 16

## 2022-06-21 DIAGNOSIS — D631 Anemia in chronic kidney disease: Secondary | ICD-10-CM

## 2022-06-21 DIAGNOSIS — N1832 Chronic kidney disease, stage 3b: Secondary | ICD-10-CM | POA: Diagnosis not present

## 2022-06-21 MED ORDER — EPOETIN ALFA 3000 UNIT/ML IJ SOLN
3000.0000 [IU] | Freq: Once | INTRAMUSCULAR | Status: AC
  Administered 2022-06-21: 3000 [IU] via SUBCUTANEOUS
  Filled 2022-06-21: qty 1

## 2022-06-21 NOTE — Progress Notes (Signed)
Diagnosis: Anemia in Chronic Kidney Disease  Provider:  Manpreet Bhutani MD  Procedure: Injection  Retacrit (epoetin alfa-epbx), Dose: 3000 Units, Site: subcutaneous, Number of injections: 1   HGB 8.2  Post Care: Patient declined observation  Discharge: Condition: Good, Destination: Home . AVS provided to patient.   Performed by:  Baxter Hire, RN

## 2022-06-22 NOTE — Procedures (Signed)
Physician Interpretation:     [facility] POLYSOMNOGRAPHY  INTERPRETATION REPORT   STUDY DATE:  06/17/2022     PATIENT NAME:  Jacklynn Bue         DATE OF BIRTH:  1966-07-31  PATIENT ID:  [id number]    TYPE OF STUDY:  PSG  READING PHYSICIAN: Star Age, MD, PhD REFERRED BY: [name], NP SCORING TECHNICIAN: Gaylyn Cheers, RPSGT  HISTORY:  56 year old male with a complex medical history of seizure disorder, mood disorder, stroke, hypertension, diabetes, coronary artery disease with history of MI, history of substance use disorder (by chart review), chronic back pain, cervical radiculopathy, and hyperlipidemia, who was previously diagnosed with obstructive sleep apnea. He does not currently use a CPAP machine. His Epworth sleepiness score is 10 out of 24, fatigue severity score is 27 out of 63. Height: 67 in Weight: 121 lb (BMI 18) Neck Size: 14 in  MEDICATIONS: Proventil HFA, Norvasc, Lipitor, Restasis, Depakote, Lexapro, Ferrous Sulfate, Amaryl, Hydrodiuril, Imdur, Lamictal, Lantus solostar, Multivitamin, Protonix, Brilinta, Vitamin D, Nitrostat TECHNICAL DESCRIPTION: A registered sleep technologist was in attendance for the duration of the recording.  Data collection, scoring, video monitoring, and reporting were performed in compliance with the AASM Manual for the Scoring of Sleep and Associated Events; (Hypopnea is scored based on the criteria listed in Section VIII D. 1b in the AASM Manual V2.6 using a 4% oxygen desaturation rule or Hypopnea is scored based on the criteria listed in Section VIII D. 1a in the AASM Manual V2.6 using 3% oxygen desaturation and /or arousal rule).   SLEEP CONTINUITY AND SLEEP ARCHITECTURE:  Lights-out was at 21:40: and lights-on at  04:43:, with  a total recording time of 7 hours, 3.5 min. Total sleep time ( TST) was 59.5 minutes with a severely decreased sleep efficiency at 14.0%. There was  0.0% REM sleep.  BODY  POSITION:  TST was divided  between the following sleep positions: 100.0% supine;  0.0% lateral;  0% prone. Duration of total sleep and percent of total sleep in their respective position is as follows: supine 59 minutes (100%), non-supine 0 minutes (0%); right 00 minutes (0%), left 00 minutes (0%), and prone 00 minutes (0%).  Total supine REM sleep time was 00 minutes (0% of total REM sleep).  Sleep latency was increased at 69.0 minutes.  REM sleep was absent.  Of the total sleep time, the percentage of stage N1 sleep was 72.3%, which is markedly increased, stage N2 sleep was 28%, stage N3 sleep and REM sleep were absent. Wake after sleep onset (WASO) time accounted for 295 minutes with severe sleep fragmentation noted.  RESPIRATORY MONITORING:  Based on CMS criteria (using a 4% oxygen desaturation rule for scoring hypopneas), there were 0 apneas (0 obstructive; 0 central; 0 mixed), and 0 hypopneas.  Apnea index was 0.0. Hypopnea index was 0.0. The apnea-hypopnea index was 0.0 overall (0.0 supine, 0 non-supine; 0.0 REM, 0.0 supine REM).  There were 0 respiratory effort-related arousals (RERAs).  The RERA index was 0 events/h. Total respiratory disturbance index (RDI) was 0.0 events/h. RDI results showed: supine RDI  0.0 /h; non-supine RDI 0.0 /h; REM RDI 0.0 /h, supine REM RDI 0.0 /h.   Based on AASM criteria (using a 3% oxygen desaturation and /or arousal rule for scoring hypopneas), there were 0 apneas (0 obstructive; 0 central; 0 mixed), and 0 hypopneas. Apnea index was 0.0. Hypopnea index was 0.0. The apnea-hypopnea index was 0.0 overall (0.0 supine, 0  non-supine; 0.0 REM, 0.0 supine REM).  There were 0 respiratory effort-related arousals (RERAs).  The RERA index was 0 events/h. Total respiratory disturbance index (RDI) was 0.0 events/h. RDI results showed: supine RDI  0.0 /h; non-supine RDI 0.0 /h; REM RDI 0.0 /h, supine REM RDI 0.0 /h.  OXIMETRY: Oxyhemoglobin Saturation Nadir during sleep was at   91%) from a mean of 99%.  Of the Total sleep time (TST)   hypoxemia (=<88%) was present for  0.0 minutes, or 0.0% of total sleep time.  LIMB MOVEMENTS: There were 0 periodic limb movements of sleep (0.0/hr), of which 0 (0.0/hr) were associated with an arousal. AROUSAL: There were 46 arousals in total, for an arousal index of 46 arousals/hour.  Of these, 0 were identified as respiratory-related arousals (0 /h), 0 were PLM-related arousals (0 /h), and 57 were non-specific arousals (57 /h). EEG: Review of the EEG showed no abnormal electrical discharges and symmetrical bihemispheric findings.   EKG: The EKG revealed normal sinus rhythm (NSR). The average heart rate during sleep was 68 bpm. AUDIO/VIDEO REVIEW: The audio and video review did not show any abnormal or unusual behaviors, movements, phonations or vocalizations. The patient took no bathroom breaks.  No audible snoring was detected.   POST-STUDY QUESTIONNAIRE: Post study, the patient indicated, that sleep was worse than usual.  IMPRESSION:  1. Dysfunctions associated with sleep stages or arousal from sleep 2. Poor sleep pattern 3. Inconclusive Test RECOMMENDATIONS:  1. This study shows a severely reduced sleep efficiency of 14% with minimal sleep achieved, but absence of slow-wave sleep and REM sleep.  As such, the total sleep time of only 59.5 minutes is not enough to render conclusive results. The patient will be offered a home sleep test for re-evaluation of his sleep disordered breathing at home.  This study shows significant sleep fragmentation and abnormal sleep stage percentages; these are nonspecific findings and per se do not signify an intrinsic sleep disorder or a cause for the patient's sleep-related symptoms. Causes include (but are not limited to) the first night effect of the sleep study, circadian rhythm disturbances, medication effect or an underlying mood disorder or medical problem.  2. The patient should be cautioned not to  drive, work at heights, or operate dangerous or heavy equipment when tired or sleepy. Review and reiteration of good sleep hygiene measures should be pursued with any patient. 3. The patient will be advised of the test results and offered a home sleep test.   I certify that I have reviewed the entire raw data recording prior to the issuance of this report in accordance with the Standards of Accreditation of the [place] of Sleep Medicine (AASM). Star Age, MD, PhD [facility] [place], ABPN (Neurology and Sleep)              Technical Report:   General Information  Name: Damaso, Laday  BMI: 16.96 Physician: Star Age, MD  ID: [id number] Height: 67.0 in Technician: Gaylyn Cheers, RPSGT  Sex: Male Weight: 121.0 lb Record: [id number]  Age: 54 [[birth year]] Date: 06/17/2022    Medical & Medication History    Mr. Barletta is a 56 year old right-handed gentleman with underlying complex medical history of seizure disorder, mood disorder, stroke, hypertension, diabetes, coronary artery disease with history of MI, history of substance use disorder (by chart review), chronic back pain, cervical radiculopathy, and hyperlipidemia, who was previously diagnosed with obstructive sleep apnea. He does not currently use a CPAP machine, has not used her machine  in over 2 years. Prior diagnostic sleep study results are not available for my review today. Testing was in or around 2014. He did have a titration study on 05/27/2013 and I was able to review the results. Study was interpreted by Dr. Phillips Odor and he was titrated to a pressure of 7 cm. I do not have a compliance download for review today. He reports that originally his sleep apnea was severe but then he was told he had mild sleep apnea. He has had quite a bit of weight loss, at the time of his titration study his weight was listed as 200 pounds and BMI at 32, current weight is 121 pounds and BMI of 19. He  is single and lives alone. He goes to bed around 1 and rise time is around 6. He quit smoking about 2 years ago and has not used any illicit drugs in 2 years. He does not drink any alcohol currently. He drinks caffeine in the form of sweet tea, 1 or 2 servings per day on average. He denies night to night nocturia, has had occasional morning headaches. His Epworth sleepiness score is 10 out of 24, fatigue severity score is 27 out of 63.  Proventil HFA, Norvasc, Lipitor, Restasis, Depakote, Lexapro, Ferrous Sulfate, Amaryl, Hydrodiuril, Imdur, Lamictal, Lantus solostar, Multivitamin, Protonix, Brilinta, Vitamin D, Nitrostat   Sleep Disorder      Comments   Patient arrived for a diagnostic polysomnogram. Procedure explained and all questions answered. Standard paste setup without complications. Patient slept supine. No snoring was heard. No respiratory events observed. Sleep efficiency greatly reduced at 14%. No obvious cardiac arrhythmias noted. No significant PLMS observed. Patient had no restroom visit.    Lights out: 09:40:35 PM Lights on: 04:43:39 AM   Time Total Supine Side Prone Upright  Recording (TRT) 7h 3.57m7h 3.5109mh 0.66m88m 0.66m 39m0.66m  41mep (TST) 0h 59.56m 0h31m.56m 0h 556mm 0h 079m 0h 0.14m  Late9m N1 N2 N3 REM Onset Per. Slp. Eff.  Actual 0h 0.66m 0h 10.067mh 0.66m 39m0.66m 167m.66m 0h756m66m 14.71m   Stg26mr Wake N1 N2 N3 REM  Total 364.0 43.0 16.5 0.0 0.0  Supine 364.0 43.0 16.5 0.0 0.0  Side 0.0 0.0 0.0 0.0 0.0  Prone 0.0 0.0 0.0 0.0 0.0  Upright 0.0 0.0 0.0 0.0 0.0   Stg % Wake N1 N2 N3 REM  Total 86.0 72.3 27.7 0.0 0.0  Supine 86.0 72.3 27.7 0.0 0.0  Side 0.0 0.0 0.0 0.0 0.0  Prone 0.0 0.0 0.0 0.0 0.0  Upright 0.0 0.0 0.0 0.0 0.0     Apnea Summary Sub Supine Side Prone Upright  Total 0 Total 0 0 0 0 0    REM 0 0 0 0 0    NREM 0 0 0 0 0  Obs 0 REM 0 0 0 0 0    NREM 0 0 0 0 0  Mix 0 REM 0 0 0 0 0    NREM 0 0 0 0 0  Cen 0 REM 0 0 0 0 0    NREM 0 0 0 0 0   Rera  Summary Sub Supine Side Prone Upright  Total 0 Total 0 0 0 0 0    REM 0 0 0 0 0    NREM 0 0 0 0 0   Hypopnea Summary Sub Supine Side Prone Upright  Total 0 Total 0 0 0 0 0    REM  0 0 0 0 0    NREM 0 0 0 0 0   4% Hypopnea Summary Sub Supine Side Prone Upright  Total (4%) 0 Total 0 0 0 0 0    REM 0 0 0 0 0    NREM 0 0 0 0 0     AHI Total Obs Mix Cen  0.00 Apnea 0.00 0.00 0.00 0.00   Hypopnea 0.00 -- -- --  0.00 Hypopnea (4%) 0.00 -- -- --    Total Supine Side Prone Upright  Position AHI 0.00 0.00 0.00 0.00 0.00  REM AHI 0.00   NREM AHI 0.00   Position RDI 0.00 0.00 0.00 0.00 0.00  REM RDI 0.00   NREM RDI 0.00    4% Hypopnea Total Supine Side Prone Upright  Position AHI (4%) 0.00 0.00 0.00 0.00 0.00  REM AHI (4%) 0.00   NREM AHI (4%) 0.00   Position RDI (4%) 0.00 0.00 0.00 0.00 0.00  REM RDI (4%) 0.00   NREM RDI (4%) 0.00    Desaturation Information Threshold: 2% <100% <90% <80% <70% <60% <50% <40%  Supine 241.0 0.0 0.0 0.0 0.0 0.0 0.0  Side 0.0 0.0 0.0 0.0 0.0 0.0 0.0  Prone 0.0 0.0 0.0 0.0 0.0 0.0 0.0  Upright 0.0 0.0 0.0 0.0 0.0 0.0 0.0  Total 241.0 0.0 0.0 0.0 0.0 0.0 0.0  Index 43.6 0.0 0.0 0.0 0.0 0.0 0.0   Threshold: 3% <100% <90% <80% <70% <60% <50% <40%  Supine 89.0 0.0 0.0 0.0 0.0 0.0 0.0  Side 0.0 0.0 0.0 0.0 0.0 0.0 0.0  Prone 0.0 0.0 0.0 0.0 0.0 0.0 0.0  Upright 0.0 0.0 0.0 0.0 0.0 0.0 0.0  Total 89.0 0.0 0.0 0.0 0.0 0.0 0.0  Index 16.1 0.0 0.0 0.0 0.0 0.0 0.0   Threshold: 4% <100% <90% <80% <70% <60% <50% <40%  Supine 33.0 0.0 0.0 0.0 0.0 0.0 0.0  Side 0.0 0.0 0.0 0.0 0.0 0.0 0.0  Prone 0.0 0.0 0.0 0.0 0.0 0.0 0.0  Upright 0.0 0.0 0.0 0.0 0.0 0.0 0.0  Total 33.0 0.0 0.0 0.0 0.0 0.0 0.0  Index 6.0 0.0 0.0 0.0 0.0 0.0 0.0   Threshold: 3% <100% <90% <80% <70% <60% <50% <40%  Supine 89 0 0 0 0 0 0  Side 0 0 0 0 0 0 0  Prone 0 0 0 0 0 0 0  Upright 0 0 0 0 0 0 0  Total 89 0 0 0 0 0 0   Awakening/Arousal Information # of Awakenings 29  Wake  after sleep onset 295.12m Wake after persistent sleep 0.024m Arousal Assoc. Arousals Index  Apneas 0 0.0  Hypopneas 0 0.0  Leg Movements 4 4.0  Snore 0 0.0  PTT Arousals 0 0.0  Spontaneous 58 58.5  Total 62 62.5  Leg Movement Information PLMS LMs Index  Total LMs during PLMS 0 0.0  LMs w/ Microarousals 0 0.0   LM LMs Index  w/ Microarousal 4 4.0  w/ Awakening 3 3.0  w/ Resp Event 0 0.0  Spontaneous 2 2.0  Total 6 6.1     Desaturation threshold setting: 3% Minimum desaturation setting: 10 seconds SaO2 nadir: 91% The longest event was a 0 sec N/A Apnea with a minimum SaO2 of --%. The lowest SaO2 was --% associated with a 00 sec N/A Apnea. EKG Rates EKG Avg Max Min  Awake 71 133 46  Asleep 68 133 49  EKG Events: Tachycardia

## 2022-06-28 ENCOUNTER — Encounter: Payer: Self-pay | Admitting: Internal Medicine

## 2022-06-29 ENCOUNTER — Encounter (HOSPITAL_COMMUNITY): Payer: Medicaid Other

## 2022-06-30 ENCOUNTER — Telehealth: Payer: Self-pay | Admitting: "Endocrinology

## 2022-06-30 ENCOUNTER — Other Ambulatory Visit: Payer: Self-pay | Admitting: "Endocrinology

## 2022-06-30 DIAGNOSIS — E1159 Type 2 diabetes mellitus with other circulatory complications: Secondary | ICD-10-CM

## 2022-06-30 NOTE — Telephone Encounter (Signed)
[name] made aware

## 2022-06-30 NOTE — Telephone Encounter (Signed)
Pt requesting an appt. Can you add lab orders?

## 2022-07-04 ENCOUNTER — Telehealth: Payer: Self-pay | Admitting: *Deleted

## 2022-07-04 DIAGNOSIS — Z8669 Personal history of other diseases of the nervous system and sense organs: Secondary | ICD-10-CM

## 2022-07-04 DIAGNOSIS — R0683 Snoring: Secondary | ICD-10-CM

## 2022-07-04 DIAGNOSIS — Z8673 Personal history of transient ischemic attack (TIA), and cerebral infarction without residual deficits: Secondary | ICD-10-CM

## 2022-07-04 DIAGNOSIS — R634 Abnormal weight loss: Secondary | ICD-10-CM

## 2022-07-04 NOTE — Telephone Encounter (Signed)
HST order placed. 

## 2022-07-04 NOTE — Telephone Encounter (Signed)
-----   Message from Star Age, MD sent at 06/28/2022  5:22 PM EST ----- Patient referred by [name] for OSA eval. He had a PSG on 06/17/22, but did not sleep enough, please advise patient that we can consider a HST if insurance approved and he would like to proceed. Please let me know and copy Meagan Zenia Resides and Raquel Sarna on this if he would like to go ahead with a HST.

## 2022-07-04 NOTE — Telephone Encounter (Signed)
I called pt and spoke to sister, [name], (ok on DPR) that per Dr. Rexene Alberts with the PSG on 06-17-2022 he did not sleep enough and if ok to proceed and ok with insurance would like to do a HST.  She said that was ok to do.  I would relay to them in the sleep lab and if insurance approved would be in touch.  She verbalized understanding.

## 2022-07-05 ENCOUNTER — Encounter (HOSPITAL_COMMUNITY): Admission: RE | Admit: 2022-07-05 | Payer: Medicaid Other | Source: Ambulatory Visit

## 2022-07-07 ENCOUNTER — Encounter (HOSPITAL_COMMUNITY)
Admission: RE | Admit: 2022-07-07 | Discharge: 2022-07-07 | Disposition: A | Discharge status: Home / Self Care | Payer: Medicaid Other | Source: Ambulatory Visit | Attending: Family Medicine | Admitting: Family Medicine

## 2022-07-07 VITALS — BP 106/74 | HR 57 | Temp 97.5°F

## 2022-07-07 DIAGNOSIS — N1832 Chronic kidney disease, stage 3b: Secondary | ICD-10-CM | POA: Diagnosis not present

## 2022-07-07 DIAGNOSIS — D631 Anemia in chronic kidney disease: Secondary | ICD-10-CM | POA: Diagnosis present

## 2022-07-07 LAB — POCT HEMOGLOBIN-HEMACUE: Hemoglobin: 8.4 g/dL — ABNORMAL LOW (ref 13.0–17.0)

## 2022-07-07 MED ORDER — EPOETIN ALFA 3000 UNIT/ML IJ SOLN
3000.0000 [IU] | Freq: Once | INTRAMUSCULAR | Status: AC
  Administered 2022-07-07: 3000 [IU] via SUBCUTANEOUS

## 2022-07-07 NOTE — Addendum Note (Signed)
Encounter addended by: Baxter Hire, RN on: 07/07/2022 1:52 PM  Actions taken: Therapy plan modified

## 2022-07-07 NOTE — Progress Notes (Signed)
Diagnosis: Anemia in Chronic Kidney Disease  Provider:  Manpreet Bhutani MD  Procedure: Injection  Procrit (epoetin alfa), Dose: 3000 Units, Site: subcutaneous, Number of injections: 1  Hgb  8.4  Post Care: Observation period completed  Discharge: Condition: Good, Destination: Home . AVS provided to patient.   Performed by:  Oren Beckmann, RN

## 2022-07-12 NOTE — Telephone Encounter (Signed)
Spoke to the patient sister.  HST- Medicaid no auth req   He is scheduled at [facility] for 08/09/22 at 3 pm.  Mailed the sister the packet.

## 2022-07-13 ENCOUNTER — Ambulatory Visit: Payer: Medicaid Other | Admitting: Podiatry

## 2022-07-13 ENCOUNTER — Encounter (HOSPITAL_COMMUNITY): Payer: Medicaid Other

## 2022-07-19 ENCOUNTER — Encounter (HOSPITAL_COMMUNITY): Payer: Medicaid Other

## 2022-07-20 ENCOUNTER — Ambulatory Visit: Payer: Medicaid Other | Admitting: "Endocrinology

## 2022-07-20 NOTE — Progress Notes (Signed)
Cardiology Office Note    Date:  07/28/2022   ID:  Melvin Taylor, DOB 06/23/1966, MRN [id number]  PCP:  [name], FNP  Cardiologist: Kate Sable, MD (Inactive)  New to me      History of Present Illness:    Melvin Taylor is a 56 y.o. male  new to me previously seen by Dr Robbi Garter of CAD (s/p CABG in 03/2016 with LIMA-LAD, SVG-D1, SVG-RCA, and Seq SVG-mid and distal OM), HTN, HLD, IDDM, OSA, Bipolar Disorder, Schizophrenia and tobacco use   TTE 12/17/20 EF 65-70%   Present to [facility] ED on 09/14/2020 for evaluation of recurrent chest pain. Troponin values were negative and EKG was without acute changes. He was started on Imdur 30 mg daily and discharged home. His stress test was performed on 09/21/2020 and showed normal resting and stress perfusion with no evidence of ischemia and was overall a low risk study.  Thinks Ranexa has caused some nausea Some palpitations brief skips with high caffeine intake both coffee and soda Activity limited by left knee pain   Admitted with stroke 12/21/20-12/30/20 right sided weakness and speech difficultyMRI with scattered infarcts in left MCA/ affecting the frontal, parietal and occipital lobe Had mechanical thrombectomy EEG negative for seizures previously on lamicatal and depakote DM poorly controlled A1c 9.7 Had marijuana in drug screen ? Allergy to ASA and d/c with Brilinta D/c to rehab    He has a new aide with him today She is with him 9-4 7 days / week. He has two sisters near by No angina Some residual slow speech and right arm weakness from stroke Use to work for a [employer]   No chest pain Like to watch the [employer] play football Difficulty walking with a cane    Past Medical History:  Diagnosis Date   Anxiety    Bipolar 1 disorder ([place])    Bone spur    left heel   Cervical radiculopathy    Chest pain    Chronic back pain    Chronic chest wall pain    Chronic neck pain    Coronary artery disease     a. s/p CABG in 03/2016 with LIMA-LAD, SVG-D1, SVG-RCA, and Seq SVG-mid and distal OM   Depression    Diabetes mellitus    Type II   Hallucinations    "long history of them"   Headache(784.0)    History of gout    HTN (hypertension)    Insomnia    Myocardial infarction ([place]) 2017   Neuropathy    Pain management    Polysubstance abuse ([place])    Right leg pain    chronic   Schizophrenia ([place])    Seizures ([place])    last sz between July 5-9th, 2016; epilepsy   Shortness of breath dyspnea    with exertion   Sleep apnea    Stroke ([place])    Transfusion of blood product refused for religious reason     Past Surgical History:  Procedure Laterality Date   ANTERIOR CERVICAL DECOMP/DISCECTOMY FUSION N/A 12/14/2015   Procedure: ANTERIOR CERVICAL DECOMPRESSION/DISCECTOMY FUSION CERVICAL FIVE -SIX;  Surgeon: Earnie Larsson, MD;  Location: [facility];  Service: Neurosurgery;  Laterality: N/A;   BIOPSY  05/07/2015   Procedure: BIOPSY (Gastric);  Surgeon: [name], MD;  Location: [facility];  Service: Endoscopy;;   CARDIAC CATHETERIZATION N/A 03/07/2016   Procedure: Left Heart Cath and Coronary Angiography;  Surgeon: Belva Crome, MD;  Location: [facility];  Service: Cardiovascular;  Laterality: N/A;   COLONOSCOPY WITH PROPOFOL N/A 05/07/2015   UXL:[id number] diverticulosis, multiple colon polyps removed, tubular adenoma, serrated colon polyp. Next colonoscopy October 2019   COLONOSCOPY WITH PROPOFOL N/A 08/02/2018   Procedure: COLONOSCOPY WITH PROPOFOL;  Surgeon: [name], MD;  Location: [facility];  Service: Endoscopy;  Laterality: N/A;  12:00pm   CORONARY ARTERY BYPASS GRAFT N/A 03/28/2016   Procedure: CORONARY ARTERY BYPASS GRAFTING (CABG) x 5 USING GREATER SAPHENOUS VEIN;  Surgeon: [name], MD;  Location: [facility];  Service: Open Heart Surgery;  Laterality: N/A;   ENDOVEIN HARVEST OF GREATER SAPHENOUS VEIN Right 03/28/2016   Procedure: ENDOVEIN HARVEST OF GREATER SAPHENOUS VEIN;   Surgeon: [name], MD;  Location: [facility];  Service: Open Heart Surgery;  Laterality: Right;   ESOPHAGEAL DILATION N/A 05/07/2015   Procedure: ESOPHAGEAL DILATION WITH 56FR MALONEY DILATOR;  Surgeon: [name], MD;  Location: [facility];  Service: Endoscopy;  Laterality: N/A;   ESOPHAGOGASTRODUODENOSCOPY (EGD) WITH PROPOFOL N/A 05/07/2015   RMR: Status post dilation of normal esophagus. Gastritis.   IR CT HEAD LTD  12/17/2020   IR CT HEAD LTD  12/17/2020   IR INTRA CRAN STENT  12/17/2020   IR PERCUTANEOUS ART THROMBECTOMY/INFUSION INTRACRANIAL INC DIAG ANGIO  12/17/2020   IR RADIOLOGIST EVAL & MGMT  03/25/2021   KNEE SURGERY Left    arthroscopy   MANDIBLE FRACTURE SURGERY     POLYPECTOMY  05/07/2015   Procedure: POLYPECTOMY (Hepatic Flexure, Distal Transverse Colon, Rectal);  Surgeon: [name], MD;  Location[facility];  Service: Endoscopy;;   RADIOLOGY WITH ANESTHESIA N/A 12/16/2020   Procedure: IR WITH ANESTHESIA;  Surgeon: Luanne Bras, MD;  Location: [facility];  Service: Radiology;  Laterality: N/A;   TEE WITHOUT CARDIOVERSION N/A 03/28/2016   Procedure: TRANSESOPHAGEAL ECHOCARDIOGRAM (TEE);  Surgeon: [name], MD;  Location: [facility];  Service: Open Heart Surgery;  Laterality: N/A;   TONSILLECTOMY     TRIGGER FINGER RELEASE Left 10/15/2021   Procedure: RELEASE TRIGGER FINGER/A-1 PULLEY left middle or long finger;  Surgeon: Carole Civil, MD;  Location: [facility];  Service: Orthopedics;  Laterality: Left;    Current Medications: Outpatient Medications Prior to Visit  Medication Sig Dispense Refill   albuterol (PROVENTIL HFA;VENTOLIN HFA) 108 (90 Base) MCG/ACT inhaler Inhale 2 puffs into the lungs every 4 (four) hours as needed for wheezing or shortness of breath.      amLODipine (NORVASC) 10 MG tablet Take 1 tablet (10 mg total) by mouth daily. 90 tablet 0   atorvastatin (LIPITOR) 80 MG tablet Take 1 tablet (80 mg total) by mouth at bedtime. 30 tablet 0   Blood Glucose  Monitoring Suppl (ACCU-CHEK GUIDE ME) w/Device KIT 1 Piece by Does not apply route as directed. 1 kit 0   cycloSPORINE (RESTASIS) 0.05 % ophthalmic emulsion Place 1 drop into both eyes 2 (two) times daily.     divalproex (DEPAKOTE) 500 MG DR tablet Take 2 tablets (1,000 mg total) by mouth 2 (two) times daily. 120 tablet 2   escitalopram (LEXAPRO) 20 MG tablet Take 20 mg by mouth daily.      ferrous sulfate 325 (65 FE) MG tablet Take 325 mg by mouth daily.     glimepiride (AMARYL) 2 MG tablet TAKE 1 TABLET DAILY WITH BREAKFAST. 30 tablet 2   glucose blood (ACCU-CHEK GUIDE) test strip Monitor glucose 2 times a day. 150 each  2   hydrochlorothiazide (HYDRODIURIL) 25 MG tablet Take 1 tablet (25 mg total) by mouth daily. 90 tablet 3   isosorbide mononitrate (IMDUR) 30 MG 24 hr tablet Take 1 tablet (30 mg total) by mouth daily. 90 tablet 3   lamoTRIgine (LAMICTAL) 25 MG tablet Take 50 mg by mouth 2 (two) times daily.     LANTUS SOLOSTAR 100 UNIT/ML Solostar Pen INJECT 40 UNITS INTO THE SKIN AT BEDTIME. (Patient taking differently: Inject 5 Units into the skin at bedtime as needed (blood sugar above below 100; take with a snack).) 15 mL 0   Multiple Vitamin (MULTIVITAMIN WITH MINERALS) TABS tablet Take 1 tablet by mouth daily.     nitroGLYCERIN (NITROSTAT) 0.4 MG SL tablet Place 1 tablet (0.4 mg total) under the tongue every 5 (five) minutes as needed for chest pain. 25 tablet 3   pantoprazole (PROTONIX) 40 MG tablet Take 1 tablet (40 mg total) by mouth daily.     ticagrelor (BRILINTA) 90 MG TABS tablet Take 1 tablet (90 mg total) by mouth 2 (two) times daily. 60 tablet 5   Vitamin D, Ergocalciferol, (DRISDOL) 1.25 MG (50000 UNIT) CAPS capsule Take 50,000 Units by mouth every 7 (seven) days.     No facility-administered medications prior to visit.     Allergies:   Aspirin   Social History   Socioeconomic History   Marital status: Single    Spouse name: Not on file   Number of children: Not on file    Years of education: 11th grade   Highest education level: Not on file  Occupational History   Occupation: unemployed    Employer: NOT EMPLOYED  Tobacco Use   Smoking status: Former    Packs/day: 0.50    Years: 30.00    Total pack years: 15.00    Types: Cigarettes   Smokeless tobacco: Never   Tobacco comments:    3 cigs per day  Vaping Use   Vaping Use: Never used  Substance and Sexual Activity   Alcohol use: Not Currently   Drug use: Not Currently    Types: Marijuana   Sexual activity: Not Currently  Other Topics Concern   Not on file  Social History Narrative   09/13/21 Lives alone, caregiver daily, manages meds   Drinks a cup of coffee a day   Social Determinants of Health   Financial Resource Strain: Not on file  Food Insecurity: Not on file  Transportation Needs: Not on file  Physical Activity: Not on file  Stress: Not on file  Social Connections: Not on file     Family History:  The patient's family history includes Arthritis in an other family member; Asthma in an other family member; Diabetes in his father and another family member; Hypertension in his father; Stroke in his sister.   Review of Systems:   Please see the history of present illness.     General:  No chills, fever, night sweats or weight changes. Positive for left knee pain.  Cardiovascular:  No chest pain, dyspnea on exertion, edema, orthopnea, palpitations, paroxysmal nocturnal dyspnea. Dermatological: No rash, lesions/masses Respiratory: No cough, dyspnea Urologic: No hematuria, dysuria Abdominal:   No nausea, vomiting, diarrhea, bright red blood per rectum, melena, or hematemesis Neurologic:  No visual changes, wkns, changes in mental status. All other systems reviewed and are otherwise negative except as noted above.   Physical Exam:    VS:  There were no vitals taken for this visit.   Affect appropriate  Healthy:  appears stated age 44: normal Neck supple with no adenopathy JVP  normal no bruits no thyromegaly Lungs clear with no wheezing and good diaphragmatic motion Heart:  S1/S2 no murmur, no rub, gallop or click PMI normal post CABG  Abdomen: benighn, BS positve, no tenderness, no AAA no bruit.  No HSM or HJR Distal pulses intact with no bruits No edema Neuro  left sided weakness more LLE from stroke  Skin warm and dry No muscular weakness   Wt Readings from Last 3 Encounters:  05/17/22 121 lb 11.1 oz (55.2 kg)  04/19/22 121 lb 9.6 oz (55.2 kg)  01/26/22 128 lb (58.1 kg)     Studies/Labs Reviewed:   EKG:  EKG is not ordered today.    Recent Labs: 10/13/2021: BUN 30; Creatinine, Ser 1.85; Platelets 165; Potassium 4.3; Sodium 138 01/26/2022: TSH 1.410 07/21/2022: Hemoglobin 8.0   Lipid Panel    Component Value Date/Time   CHOL 141 01/26/2022 1129   TRIG 98 01/26/2022 1129   HDL 67 01/26/2022 1129   CHOLHDL 2.1 01/26/2022 1129   CHOLHDL 2.8 12/17/2020 0254   VLDL 29 12/17/2020 0254   LDLCALC 56 01/26/2022 1129    Additional studies/ records that were reviewed today include:   Echocardiogram: 03/2019 IMPRESSIONS     1. The left ventricle has normal systolic function with an ejection  fraction of 60-65%. The cavity size was normal. Mild to moderate  concentric LVH. Left ventricular diastolic Doppler parameters are  indeterminate. No evidence of left ventricular  regional wall motion abnormalities.   2. Right atrial size was mildly dilated.   3. The mitral valve is grossly normal. Mild thickening of the mitral  valve leaflet.   4. The tricuspid valve is grossly normal.   5. The aortic valve is tricuspid.   6. The aorta is normal in size and structure.   NST: 09/2020 The study is normal. This is a low risk study. The left ventricular ejection fraction is normal (55-65%).   Normal resting and stress perfusion. No ischemia or infarction EF 56%     Assessment:    No diagnosis found.    Plan:   In order of problems listed  above:  1. CAD - He is s/p CABG in 03/2016 with LIMA-LAD, SVG-D1, SVG-RCA, and Seq SVG-mid and distal OM. Myovue 12/21/20 showed normal resting and stress perfusion with no evidence of ischemia and was overall a low risk study. - He denies any recurrent chest pain Ranexa dose cut back . Will continue Plavix 32m daily (allergic to ASA), Amlodipine 149mdaily, Atorvastatin 4017maily and Imdur 16m4mily.   2. HTN - BP is well-controlled at 128/82 during today's visit. Continue Amlodipine, Lisinopril and Imdur at current dosing.   3. HLD - LDL was at 64 in 08/2020. Continue Atorvastatin 40mg42mly.   4. Palpitations - He reports occasional "skips" which sound most consistent with PAC's or PVC's. Cut back caffeine no need for monitor   5. Left Knee Pain - He reports symptoms consistent with OA. He is unsure of any prior imaging and has never had a steroid injection to the site. F/U ortho   6. Stroke:  post mechanical thrombectomy on Briilinta  f/u primary consider more PT/OT s/p M1 Stenting Dr Deves[place]stanislado [place] right sided weakness   7. DM:  poor control A1c 9.7 f/u primary importance of better control with recnet CVA and CAD discussed   8. Seizures:  EEG negative in hospital continue  lamictal and depakote   9. Anemia:  getting Procrit injections Hb 8.4   10. CRF:  Cr 1.9 range at baseline f/u nephrology    F/U in a year    Signed, Jenkins Rouge, MD  07/28/2022 9:37 AM    [facility] [address] [place], [place] [address] Phone: [phone] Fax: [phone]

## 2022-07-21 ENCOUNTER — Encounter (HOSPITAL_COMMUNITY)
Admission: RE | Admit: 2022-07-21 | Discharge: 2022-07-21 | Disposition: A | Discharge status: Home / Self Care | Payer: Medicaid Other | Source: Ambulatory Visit | Attending: Family Medicine | Admitting: Family Medicine

## 2022-07-21 VITALS — BP 110/77 | HR 78 | Temp 97.7°F | Resp 16

## 2022-07-21 DIAGNOSIS — N1832 Chronic kidney disease, stage 3b: Secondary | ICD-10-CM | POA: Diagnosis not present

## 2022-07-21 LAB — POCT HEMOGLOBIN-HEMACUE: Hemoglobin: 8 g/dL — ABNORMAL LOW (ref 13.0–17.0)

## 2022-07-21 MED ORDER — EPOETIN ALFA 3000 UNIT/ML IJ SOLN
3000.0000 [IU] | Freq: Once | INTRAMUSCULAR | Status: AC
  Administered 2022-07-21: 3000 [IU] via SUBCUTANEOUS

## 2022-07-21 NOTE — Progress Notes (Signed)
Diagnosis: Anemia in Chronic Kidney Disease  Provider:  Manpreet Bhutani MD  Procedure: Injection  Procrit (epoetin alfa), Dose: 3000 Units, Site: subcutaneous, Number of injections: 1  HGB 8.0  Post Care: Patient declined observation  Discharge: Condition: Good, Destination: Home . AVS provided to patient.   Performed by:  Baxter Hire, RN

## 2022-07-21 NOTE — Addendum Note (Signed)
Encounter addended by: Baxter Hire, RN on: 07/21/2022 11:49 AM  Actions taken: Clinical Note Signed

## 2022-07-27 ENCOUNTER — Encounter (HOSPITAL_COMMUNITY): Payer: Medicaid Other

## 2022-07-28 ENCOUNTER — Encounter: Payer: Self-pay | Admitting: Cardiovascular Disease

## 2022-07-28 ENCOUNTER — Encounter: Payer: Medicaid Other | Attending: Cardiovascular Disease | Admitting: Cardiovascular Disease

## 2022-07-28 VITALS — BP 108/70 | HR 60 | Ht 67.0 in | Wt 128.0 lb

## 2022-07-28 DIAGNOSIS — Z951 Presence of aortocoronary bypass graft: Secondary | ICD-10-CM

## 2022-07-28 DIAGNOSIS — I63512 Cerebral infarction due to unspecified occlusion or stenosis of left middle cerebral artery: Secondary | ICD-10-CM | POA: Diagnosis present

## 2022-07-28 DIAGNOSIS — E782 Mixed hyperlipidemia: Secondary | ICD-10-CM

## 2022-07-28 DIAGNOSIS — I10 Essential (primary) hypertension: Secondary | ICD-10-CM

## 2022-07-28 NOTE — Patient Instructions (Signed)
Medication Instructions:  Your physician recommends that you continue on your current medications as directed. Please refer to the Current Medication list given to you today.   Labwork: None  Testing/Procedures: None  Follow-Up: Follow up with Dr. Nishan in 1 year.   Any Other Special Instructions Will Be Listed Below (If Applicable).     If you need a refill on your cardiac medications before your next appointment, please call your pharmacy.  

## 2022-08-02 ENCOUNTER — Encounter (HOSPITAL_COMMUNITY): Payer: Medicaid Other

## 2022-08-02 ENCOUNTER — Other Ambulatory Visit: Payer: Self-pay

## 2022-08-02 NOTE — Progress Notes (Unsigned)
[facility] [address] [place]. [place] [address] [phone]       STROKE FOLLOW UP NOTE  [name] Date of Birth:  11-Sep-1965 Medical Record Number:  [id number]   Reason for Referral: stroke follow up    SUBJECTIVE:   CHIEF COMPLAINT:  No chief complaint on file.    HPI:   Update 08/03/2022 JM: Patient returns for 61-monthfollow-up.  Stable from stroke standpoint without new stroke/TIA symptoms.  Continued right hemiparesis.  Reports compliance on Brilinta and atorvastatin.  Blood pressure well-controlled.  Remains on Depakote, denies any recent seizure activity.  Continued daytime fatigue, completed in lab sleep study to evaluate for sleep apnea but did not have sufficient sleep time therefore plans on proceeding with HST currently scheduled 1/9.     History provided for reference purposes only Update 01/26/2022 JM: Patient returns for stroke follow-up after prior visit 6 months ago.  He is unaccompanied.  Caregiver in lobby.  Has not been feeling well.  Reports continued right-sided weakness which he believes has been gradually worsening. Reports multiple areas of joint pain.  Positive sedentary without much activity during the day and no routine exercise.  Continued excessive daytime fatigue, has been having difficulty sleeping at night.  Continued neck pain.  Lives alone but now has caregiver assistance daily from 8:30 AM -4:30 PM who assists with majority of IADLs including medications. Reports compliance on Depakote, was previously on 1000 mg twice daily but currently only taking 500 mg twice daily, sister decreased to see if decreased dosage helped with fatigue, pt denies any noticeable benefit.  Denies any seizure activity.  Remains on Lamictal for bipolar disorder.  Remains on Brilinta and atorvastatin, denies side effects.  Blood pressure 112/76.  Denies new stroke/TIA symptoms.  Completed imaging after prior visit due to concerns of  worsening RUE weakness showed chronic prior infarcts and T1 hyperintensity within the left lentiform nuclei new compared to imaging in 11/2020 possibly related to dystrophic mineralization vs artifact, no acute findings.   Since prior visit, evaluated by nephrology for worsening kidney disease.  Currently receiving Epogen shots for anemia of chronic disease.  Also remains on vitamin D supplement for vitamin D deficiency.  No further concerns today  Update 07/12/2021 JM: Patient returns for 454-monthtroke follow-up with sister present via cell phone  c/o worsening right hand weakness, fatigue, and headaches -Worsening RUE weakness - c/o wrist/hand pain and weakness and difficulty grasping in right hand. This has been present since stroke but believes this has been worsening.  Previously working with OT and placed on hold until further eval for fatigue can be completed but noted doing greater frequency of HEP (tried to educate multipel times per notes). Sister reports she would encourage him to do this exercise throughout the day as she felt this would be helpful. OT notes did note some decline of grip strength bilaterally  -C/o fatigue throughout the day over the past month, sleeping during the day and at night. He belives this is due to medication side effects. Sister reports currently taking abx for tooth infection and UTI. PCP also recently recommended starting iron supplement and vitamin D supplement but not yet started.   -C/o headache almost every other day (reports present since his stroke) present upon awakening and at night when laying in bed, right-sided radiating to back of neck, descried as throbbing and aching. Has not tried any conservative measures. Does endorse chronic neck pain with prior history of C5-6  ACDF . Does believe pain gradually worsening.    Continued cognitive impairment - limited progress with SLP due to poor attendance and poor motivation there has since been discharged.  Sister continues to manage medications ensuring compliance.   Compliant on Brilinta and atorvastatin -denies side effects Blood pressure today 110/73 Denies tobacco use.  Followed by Dr. Leta Baptist for history of seizures on Depakote and lamotrigine. No witnessed seizure activity. No recent medication changes  F/u with IR 8/23 in office with repeat MRA yesterday - planned to continue Brilinta until f/u imaging completed  No further concerns at this time    Initial visit 03/09/2021 JM: [name] is being seen for hospital follow-up accompanied by his sister, [name].  Overall stable since discharge without new stroke/TIA symptoms.  Reports residual RUE weakness with gradual improvement although still has difficulty with use.  Denies residual RLE weakness.  Completed HH therapies.  Sister also reports short-term memory impairment which was present previously but worsened post stroke.  He is currently living with his aunt.  Previously living on his own but had an aide 6 days/week during the day.  Able to maintain ADLs independently.  Needs assistance for IADLs. Sister also reports generalized tremors but per patient, these have been present prior to his stroke.  They are usually worse in the morning and get better as the day goes on.  Compliant on Brilinta and atorvastatin 80 mg daily -denies side effects.  Blood pressure today 150/92.  Routinely monitors at home and typically stable.  Complete tobacco cessation since discharge.  No seizure activity with continued use of Depakote 1058m BID and Lamictal 50 mg twice daily. Sister reports likely medication noncompliance previously as they found multiple full bottles of medications in his home.  Family now provides assistance for medication administration.  No further concerns at this time.  Stroke admission 12/13/2020 Mr. Melvin LOBERis a 57y.o. male with history of seizure on Depakote and Lamictal, CAD status post CABG, diabetes, hypertension,  hyperlipidemia, OSA on CPAP, stage 3 CKD, substance abuse admitted to OSH on 12/16/2020 for seizure, right-sided weakness and slurred speech.  Personally reviewed hospitalization, pertinent progress notes, imaging and lab work.  NIHSS 14. No tPA d/t outside window.  CTA L M1 occlusion and CT perfusion 160cc at risk (although slightly overestimated).  [facility].  Complete stroke work-up revealed left MCA infarct due to left M1 occlusion s/p IR with TICI 3 reperfusion and L MCA stenting, secondary to large vessel disease.  Repeat CT head showed trace SAH postprocedure.  In addition to acute infarcts, MRI showed small chronic cortical infarcts in the right occipital lobe. MRA diffuse intracranial stenosis.  EF 65 to 70%.  LDL 116 - increased atorvastatin from 489mto 8034maily.  A1c 9.7.  Initiated Brilinta post stent, hx of asa allergy.  History of seizure disorder on Depakote and Lamictal with recent breakthrough seizure discharged on Depakote 1000 mg twice daily (1500m60mD PTA) and Lamictal 50 mg twice daily.  HTN stable long-term BP goal 130-150.  Current tobacco use.  UDS positive for THC.  Residual deficit of cognitive impairment, mild dysarthria, right-sided weakness.  Evaluated by therapies and discharged to CIR.      ROS:   14 system review of systems performed and negative with exception of those listed in HPI  PMH:  Past Medical History:  Diagnosis Date   Anxiety    Bipolar 1 disorder (HCC)[place] Bone spur  left heel   Cervical radiculopathy    Chest pain    Chronic back pain    Chronic chest wall pain    Chronic neck pain    Coronary artery disease    a. s/p CABG in 03/2016 with LIMA-LAD, SVG-D1, SVG-RCA, and Seq SVG-mid and distal OM   Depression    Diabetes mellitus    Type II   Hallucinations    "long history of them"   Headache(784.0)    History of gout    HTN (hypertension)    Insomnia    Myocardial infarction ([place]) 2017   Neuropathy     Pain management    Polysubstance abuse ([place])    Right leg pain    chronic   Schizophrenia ([place])    Seizures ([place])    last sz between July 5-9th, 2016; epilepsy   Shortness of breath dyspnea    with exertion   Sleep apnea    Stroke ([place])    Transfusion of blood product refused for religious reason     PSH:  Past Surgical History:  Procedure Laterality Date   ANTERIOR CERVICAL DECOMP/DISCECTOMY FUSION N/A 12/14/2015   Procedure: ANTERIOR CERVICAL DECOMPRESSION/DISCECTOMY FUSION CERVICAL FIVE -SIX;  Surgeon: Earnie Larsson, MD;  Location: [facility];  Service: Neurosurgery;  Laterality: N/A;   BIOPSY  05/07/2015   Procedure: BIOPSY (Gastric);  Surgeon: [name], MD;  Location: [facility];  Service: Endoscopy;;   CARDIAC CATHETERIZATION N/A 03/07/2016   Procedure: Left Heart Cath and Coronary Angiography;  Surgeon: Belva Crome, MD;  Location: [place] [facility];  Service: Cardiovascular;  Laterality: N/A;   COLONOSCOPY WITH PROPOFOL N/A 05/07/2015   IPJ:[id number] diverticulosis, multiple colon polyps removed, tubular adenoma, serrated colon polyp. Next colonoscopy October 2019   COLONOSCOPY WITH PROPOFOL N/A 08/02/2018   Procedure: COLONOSCOPY WITH PROPOFOL;  Surgeon: [name], MD;  Location: [facility];  Service: Endoscopy;  Laterality: N/A;  12:00pm   CORONARY ARTERY BYPASS GRAFT N/A 03/28/2016   Procedure: CORONARY ARTERY BYPASS GRAFTING (CABG) x 5 USING GREATER SAPHENOUS VEIN;  Surgeon: [name], MD;  Location: [place] OR;  Service: Open Heart Surgery;  Laterality: N/A;   ENDOVEIN HARVEST OF GREATER SAPHENOUS VEIN Right 03/28/2016   Procedure: ENDOVEIN HARVEST OF GREATER SAPHENOUS VEIN;  Surgeon: [name], MD;  Location: [place];  Service: Open Heart Surgery;  Laterality: Right;   ESOPHAGEAL DILATION N/A 05/07/2015   Procedure: ESOPHAGEAL DILATION WITH 56FR MALONEY DILATOR;  Surgeon: [name], MD;  Location: [facility];  Service: Endoscopy;  Laterality: N/A;    ESOPHAGOGASTRODUODENOSCOPY (EGD) WITH PROPOFOL N/A 05/07/2015   RMR: Status post dilation of normal esophagus. Gastritis.   IR CT HEAD LTD  12/17/2020   IR CT HEAD LTD  12/17/2020   IR INTRA CRAN STENT  12/17/2020   IR PERCUTANEOUS ART THROMBECTOMY/INFUSION INTRACRANIAL INC DIAG ANGIO  12/17/2020   IR RADIOLOGIST EVAL & MGMT  03/25/2021   KNEE SURGERY Left    arthroscopy   MANDIBLE FRACTURE SURGERY     POLYPECTOMY  05/07/2015   Procedure: POLYPECTOMY (Hepatic Flexure, Distal Transverse Colon, Rectal);  Surgeon: [name], MD;  Location: [facility];  Service: Endoscopy;;   RADIOLOGY WITH ANESTHESIA N/A 12/16/2020   Procedure: IR WITH ANESTHESIA;  Surgeon: Luanne Bras, MD;  Location: [place];  Service: Radiology;  Laterality: N/A;   TEE WITHOUT CARDIOVERSION N/A 03/28/2016   Procedure: TRANSESOPHAGEAL ECHOCARDIOGRAM (TEE);  Surgeon: [name], MD;  Location: [facility];  Service: Open Heart Surgery;  Laterality: N/A;   TONSILLECTOMY     TRIGGER FINGER RELEASE Left 10/15/2021   Procedure: RELEASE TRIGGER FINGER/A-1 PULLEY left middle or long finger;  Surgeon: Carole Civil, MD;  Location: [facility];  Service: Orthopedics;  Laterality: Left;    Social History:  Social History   Socioeconomic History   Marital status: Single    Spouse name: Not on file   Number of children: Not on file   Years of education: 11th grade   Highest education level: Not on file  Occupational History   Occupation: unemployed    Employer: NOT EMPLOYED  Tobacco Use   Smoking status: Former    Packs/day: 0.50    Years: 30.00    Total pack years: 15.00    Types: Cigarettes   Smokeless tobacco: Never   Tobacco comments:    3 cigs per day  Vaping Use   Vaping Use: Never used  Substance and Sexual Activity   Alcohol use: Not Currently   Drug use: Not Currently    Types: Marijuana   Sexual activity: Not Currently  Other Topics Concern   Not on file  Social History Narrative   09/13/21 Lives alone,  caregiver daily, manages meds   Drinks a cup of coffee a day   Social Determinants of Health   Financial Resource Strain: Not on file  Food Insecurity: Not on file  Transportation Needs: Not on file  Physical Activity: Not on file  Stress: Not on file  Social Connections: Not on file  Intimate Partner Violence: Not on file    Family History:  Family History  Problem Relation Age of Onset   Diabetes Father    Hypertension Father    Sleep apnea Father    Stroke Sister    Arthritis Other    Diabetes Other    Asthma Other     Medications:   Current Outpatient Medications on File Prior to Visit  Medication Sig Dispense Refill   albuterol (PROVENTIL HFA;VENTOLIN HFA) 108 (90 Base) MCG/ACT inhaler Inhale 2 puffs into the lungs every 4 (four) hours as needed for wheezing or shortness of breath.      amLODipine (NORVASC) 10 MG tablet Take 1 tablet (10 mg total) by mouth daily. 90 tablet 0   atorvastatin (LIPITOR) 80 MG tablet Take 1 tablet (80 mg total) by mouth at bedtime. 30 tablet 0   Blood Glucose Monitoring Suppl (ACCU-CHEK GUIDE ME) w/Device KIT 1 Piece by Does not apply route as directed. 1 kit 0   cycloSPORINE (RESTASIS) 0.05 % ophthalmic emulsion Place 1 drop into both eyes 2 (two) times daily.     divalproex (DEPAKOTE) 500 MG DR tablet Take 2 tablets (1,000 mg total) by mouth 2 (two) times daily. 120 tablet 2   escitalopram (LEXAPRO) 20 MG tablet Take 20 mg by mouth daily.      ferrous sulfate 325 (65 FE) MG tablet Take 325 mg by mouth daily.     glimepiride (AMARYL) 2 MG tablet TAKE 1 TABLET DAILY WITH BREAKFAST. 30 tablet 2   glucose blood (ACCU-CHEK GUIDE) test strip Monitor glucose 2 times a day. 150 each 2   hydrochlorothiazide (HYDRODIURIL) 25 MG tablet Take 1 tablet (25 mg total) by mouth daily. 90 tablet 3   isosorbide mononitrate (IMDUR) 30 MG 24 hr tablet Take 1 tablet (30 mg total) by mouth daily. 90 tablet 3   lamoTRIgine (LAMICTAL) 25 MG tablet Take 50 mg  by  mouth 2 (two) times daily.     LANTUS SOLOSTAR 100 UNIT/ML Solostar Pen INJECT 40 UNITS INTO THE SKIN AT BEDTIME. (Patient taking differently: Inject 5 Units into the skin at bedtime as needed (blood sugar above below 100; take with a snack).) 15 mL 0   Multiple Vitamin (MULTIVITAMIN WITH MINERALS) TABS tablet Take 1 tablet by mouth daily.     nitroGLYCERIN (NITROSTAT) 0.4 MG SL tablet Place 1 tablet (0.4 mg total) under the tongue every 5 (five) minutes as needed for chest pain. 25 tablet 3   pantoprazole (PROTONIX) 40 MG tablet Take 1 tablet (40 mg total) by mouth daily.     ticagrelor (BRILINTA) 90 MG TABS tablet Take 1 tablet (90 mg total) by mouth 2 (two) times daily. 60 tablet 5   Vitamin D, Ergocalciferol, (DRISDOL) 1.25 MG (50000 UNIT) CAPS capsule Take 50,000 Units by mouth every 7 (seven) days.     No current facility-administered medications on file prior to visit.    Allergies:   Allergies  Allergen Reactions   Aspirin Swelling and Other (See Comments)    Facial swelling       OBJECTIVE:  Physical Exam  There were no vitals filed for this visit.  There is no height or weight on file to calculate BMI. No results found.  General: well developed, well nourished, pleasant middle-aged African-American male, seated, in no evident distress Head: head normocephalic and atraumatic.   Neck: supple with no carotid or supraclavicular bruits,  Cardiovascular: regular rate and rhythm, no murmurs Musculoskeletal: no deformity, neck tightness with rotation left and right with cervical paraspinal tenderness Skin:  no rash/petichiae Vascular:  Normal pulses all extremities   Neurologic Exam Mental Status: Awake although fatigued, yawning frequently throughout visit.  Fluent speech and language.  Oriented to place and time. Recent memory impaired and remote memory impaired.  Attention span, concentration and fund of knowledge diminished.  Mood and affect flat.  Cranial Nerves: Pupils  equal, briskly reactive to light. Extraocular movements full without nystagmus. Visual fields full to confrontation. Hearing intact. Facial sensation intact. Face, tongue, palate moves normally and symmetrically.  Motor: Difficulty fully testing strength in all extremities due to pain.  Despite this, strength relatively equal throughout except mild RLE hip flexor weakness Sensory.: intact to touch , pinprick , position and vibratory sensation.  Coordination: Rapid alternating movements normal in all extremities except slightly decreased right hand dexterity. Finger-to-nose and heel-to-shin right-sided incoordination.  Mild tremor noted in all extremities with activation.  No evidence of resting tremor or cogwheel rigidity Gait and Station: Arises from chair with mild difficulty. Stance is hunched at back and knees. Gait demonstrates wide-based gait with out-toeing and mild unsteadiness. No use of AD.  Tandem walking heel toe not attempted. Reflexes: 1+ and symmetric. Toes downgoing.         ASSESSMENT: Melvin Taylor is a 57 y.o. year old male with left MCA infarct on 12/16/2020 due to left M1 occlusion s/p IR with stenting secondary to ICAD after presenting with right-sided weakness and slurred speech.  Vascular risk factors include HTN, HLD, DM, CAD s/p CABG, hx of substance abuse, prior stroke on imaging (R occipital lobe), OSA and seizures on Depakote and Lamictal.      PLAN:  L MCA stroke:  Referral placed to PT/OT for gait impairment, generalized pain and cervicalgia. Discussed importance of actively participating with therapies for optimal benefit for which patient agreed and wished to pursue. Continue to follow  with Dr. Estanislado Pandy s/p M1 stenting Continue Brilinta (ticagrelor) 90 mg bid  and atorvastatin 80 mg daily for secondary stroke prevention.   Discussed secondary stroke prevention measures and importance of close PCP follow up for aggressive stroke risk factor management  including BP goal<130/90, HLD with LDL goal<70 and DM with A1c.<7.  Stroke labs 12/2021: LDL 56, A1c 5.3 I have gone over the pathophysiology of stroke, warning signs and symptoms, risk factors and their management in some detail with instructions to go to the closest emergency room for symptoms of concern.  Seizure disorder:  On Depakote 535m BID followed by Dr. PLeta Baptist   No additional seizures.   Depakote level 86 (12/2021) On lamotrigine for bipolar disorder managed by outside provider  Excessive daytime fatigue:  Likely multifactorial in setting of multiple comorbidities and polypharmacy.   Completed in lab study with severely reduced sleep efficiency with minimal sleep achieved and plans on pursuing HST currently scheduled 1/9. B12 and TSH WNL Continue to follow with nephrology for anemia management.   Discussed increasing daytime activity with routine exercise and avoid daytime napping.  Cervicalgia, chronic: Advised to follow-up with PCP for further discussion and possible need of reevaluation by orthopedic/neurosurgery    Follow up in 6 months or call earlier if needed   CC:  PCP: MDenyce Robert FNP    I spent 38 minutes of face-to-face and non-face-to-face time with patient and sister (via cell phone).  This included previsit chart review, lab review, study review, order entry, electronic health record documentation, patient and sister education regarding prior stroke and residual deficits, secondary stroke prevention measures and importance of managing stroke risk factors, seizure disorder and multiple other complaints as above and answered all other questions to patient and sisters satisfaction  JFrann Rider AGNP-BC  [facility] [address]SHiramGDowning [place] [address] Phone [phone] [phone]: This document was prepared with digital dictation and possible smart phrase technology. Any transcriptional errors that  result from this process are unintentional.

## 2022-08-03 ENCOUNTER — Ambulatory Visit (INDEPENDENT_AMBULATORY_CARE_PROVIDER_SITE_OTHER): Payer: Medicaid Other | Admitting: Adult Health

## 2022-08-03 ENCOUNTER — Ambulatory Visit: Payer: Medicaid Other | Admitting: Adult Health

## 2022-08-03 ENCOUNTER — Encounter: Payer: Self-pay | Admitting: Adult Health

## 2022-08-03 VITALS — BP 109/69 | HR 67 | Ht 67.0 in | Wt 123.0 lb

## 2022-08-03 DIAGNOSIS — G40219 Localization-related (focal) (partial) symptomatic epilepsy and epileptic syndromes with complex partial seizures, intractable, without status epilepticus: Secondary | ICD-10-CM | POA: Diagnosis not present

## 2022-08-03 DIAGNOSIS — I63512 Cerebral infarction due to unspecified occlusion or stenosis of left middle cerebral artery: Secondary | ICD-10-CM | POA: Diagnosis not present

## 2022-08-03 DIAGNOSIS — G4710 Hypersomnia, unspecified: Secondary | ICD-10-CM | POA: Diagnosis not present

## 2022-08-03 MED ORDER — DIVALPROEX SODIUM 500 MG PO DR TAB: 500.0000 mg | DELAYED_RELEASE_TABLET | Freq: Two times a day (BID) | ORAL | 11 refills | Status: DC

## 2022-08-03 NOTE — Patient Instructions (Addendum)
Continue Brilinta (ticagrelor) 90 mg bid  and atorvastatin for secondary stroke prevention  Continue Depakote 500 mg twice daily for seizure prevention, please call with any seizure activity   Continue to follow up with PCP regarding blood pressure and cholesterol management  Maintain strict control of hypertension with blood pressure goal below 130/90, diabetes with hemoglobin A1c goal below 7.0 % and cholesterol with LDL cholesterol (bad cholesterol) goal below 70 mg/dL.   Signs of a Stroke? Follow the BEFAST method:  Balance Watch for a sudden loss of balance, trouble with coordination or vertigo Eyes Is there a sudden loss of vision in one or both eyes? Or double vision?  Face: Ask the person to smile. Does one side of the face droop or is it numb?  Arms: Ask the person to raise both arms. Does one arm drift downward? Is there weakness or numbness of a leg? Speech: Ask the person to repeat a simple phrase. Does the speech sound slurred/strange? Is the person confused ? Time: If you observe any of these signs, call 911.     Followup in the future with me in 6 months with Dr. Leta Baptist for seizure follow up or call earlier if needed       Thank you for coming to see [place] at [facility]. I hope we have been able to provide you high quality care today.  You may receive a patient satisfaction survey over the next few weeks. We would appreciate your feedback and comments so that we may continue to improve ourselves and the health of our patients.

## 2022-08-04 ENCOUNTER — Encounter (HOSPITAL_COMMUNITY)
Admission: RE | Admit: 2022-08-04 | Discharge: 2022-08-04 | Disposition: A | Discharge status: Home / Self Care | Payer: Medicaid Other | Source: Ambulatory Visit | Attending: Nephrology | Admitting: Nephrology

## 2022-08-04 VITALS — BP 95/69 | HR 71 | Temp 98.4°F | Resp 16

## 2022-08-04 DIAGNOSIS — N1832 Chronic kidney disease, stage 3b: Secondary | ICD-10-CM | POA: Diagnosis present

## 2022-08-04 DIAGNOSIS — D631 Anemia in chronic kidney disease: Secondary | ICD-10-CM | POA: Insufficient documentation

## 2022-08-04 LAB — POCT HEMOGLOBIN-HEMACUE: Hemoglobin: 6.3 g/dL — CL (ref 13.0–17.0)

## 2022-08-04 MED ORDER — EPOETIN ALFA 10000 UNIT/ML IJ SOLN
5000.0000 [IU] | Freq: Once | INTRAMUSCULAR | Status: AC
  Administered 2022-08-04: 5000 [IU] via SUBCUTANEOUS
  Filled 2022-08-04: qty 1

## 2022-08-04 NOTE — Progress Notes (Deleted)
Diagnosis: Anemia in Chronic Kidney Disease  Provider:  Manpreet Bhutani MD  Procedure: Injection  Procrit (epoetin alfa), Dose: 5000 units , Site: subcutaneous, Number of injections: 1  Hgb 6.3  Post Care: Patient declined observation  Discharge: Condition: Stable, Destination: Home . AVS provided to patient.   Performed by:  Baxter Hire, RN

## 2022-08-04 NOTE — Progress Notes (Signed)
Diagnosis: Anemia in Chronic Kidney Disease  Provider:  Manpreet Bhutani MD  Procedure: Injection  Epogen (epoetin alfa), Dose: 5000 , Site: subcutaneous, Number of injections: 1  HGB 6.3  Post Care: Patient Declined Observation   Discharge: Condition: Good, Destination: Home . AVS provided to patient.   Performed by:  Grayland Ormond, RN

## 2022-08-04 NOTE — Progress Notes (Signed)
Hgb 6.3 with Hemocue, BP 95/69. Thayer Headings, Dr. Toya Smothers nurse notified of findings. She spoke with Dr. Theador Hawthorne who ordered a type and cross for 2 units of PRBC's to be infused. It was discovered that patient had form in chart for refusal of blood products. Patient was asked if he still refused blood products and he stated that he did, he is a Restaurant manager, fast food. Janice/Dr. Theador Hawthorne apprised of this and advised clinic RN to discharge patient after Procrit injection. Patient verbalized agreement with this.

## 2022-08-05 ENCOUNTER — Ambulatory Visit: Payer: Medicaid Other | Admitting: Gastroenterology

## 2022-08-05 ENCOUNTER — Telehealth: Payer: Self-pay | Admitting: Gastroenterology

## 2022-08-05 ENCOUNTER — Encounter: Payer: Self-pay | Admitting: Gastroenterology

## 2022-08-05 VITALS — BP 110/71 | HR 72 | Temp 96.8°F | Ht 67.0 in | Wt 123.8 lb

## 2022-08-05 DIAGNOSIS — R634 Abnormal weight loss: Secondary | ICD-10-CM

## 2022-08-05 NOTE — Progress Notes (Signed)
Patient left without being seen. Seen separate telephone note.

## 2022-08-05 NOTE — Telephone Encounter (Signed)
Patient had appointment at 9:30 Patient and caregiver left prior to 10am without patient being seen stating they had to pick up someone at school.  Chart had been reviewed. Recent Hgb of 6.3 yesterday, down from 8.0. 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 Dr. Theador Hawthorne (ordering provider) aware.   From a GI standpoint, he needs evaluation for his anemia. Please call patient and let him know he needs to reschedule his appointment so that we can work up his anemia. Offer him an appointment next week.   He has been seen here before so he can be put in available established patient spots.

## 2022-08-05 NOTE — Telephone Encounter (Signed)
Noted  

## 2022-08-06 LAB — COMPREHENSIVE METABOLIC PANEL
ALT: 7 IU/L (ref 0–44)
AST: 20 IU/L (ref 0–40)
Albumin/Globulin Ratio: 1.7 (ref 1.2–2.2)
Albumin: 3.8 g/dL (ref 3.8–4.9)
Alkaline Phosphatase: 47 IU/L (ref 44–121)
BUN/Creatinine Ratio: 10 (ref 9–20)
BUN: 16 mg/dL (ref 6–24)
Bilirubin Total: 0.5 mg/dL (ref 0.0–1.2)
CO2: 22 mmol/L (ref 20–29)
Calcium: 8.8 mg/dL (ref 8.7–10.2)
Chloride: 104 mmol/L (ref 96–106)
Creatinine, Ser: 1.59 mg/dL — ABNORMAL HIGH (ref 0.76–1.27)
Globulin, Total: 2.2 g/dL (ref 1.5–4.5)
Glucose: 120 mg/dL — ABNORMAL HIGH (ref 70–99)
Potassium: 5 mmol/L (ref 3.5–5.2)
Sodium: 140 mmol/L (ref 134–144)
Total Protein: 6 g/dL (ref 6.0–8.5)
eGFR: 50 mL/min/{1.73_m2} — ABNORMAL LOW (ref >59)

## 2022-08-06 LAB — LIPID PANEL
Chol/HDL Ratio: 1.9 ratio (ref 0.0–5.0)
Cholesterol, Total: 94 mg/dL — ABNORMAL LOW (ref 100–199)
HDL: 50 mg/dL (ref >39)
LDL Chol Calc (NIH): 29 mg/dL (ref 0–99)
Triglycerides: 70 mg/dL (ref 0–149)
VLDL Cholesterol Cal: 15 mg/dL (ref 5–40)

## 2022-08-06 LAB — T4, FREE: Free T4: 0.93 ng/dL (ref 0.82–1.77)

## 2022-08-06 LAB — VITAMIN D 25 HYDROXY (VIT D DEFICIENCY, FRACTURES): Vit D, 25-Hydroxy: 90.2 ng/mL (ref 30.0–100.0)

## 2022-08-06 LAB — TSH: TSH: 2.64 u[IU]/mL (ref 0.450–4.500)

## 2022-08-08 ENCOUNTER — Ambulatory Visit: Payer: Medicaid Other | Admitting: "Endocrinology

## 2022-08-10 ENCOUNTER — Ambulatory Visit: Payer: Medicaid Other | Admitting: Neurology

## 2022-08-10 DIAGNOSIS — G4733 Obstructive sleep apnea (adult) (pediatric): Secondary | ICD-10-CM | POA: Diagnosis not present

## 2022-08-10 DIAGNOSIS — R0683 Snoring: Secondary | ICD-10-CM

## 2022-08-10 DIAGNOSIS — Z8669 Personal history of other diseases of the nervous system and sense organs: Secondary | ICD-10-CM

## 2022-08-10 DIAGNOSIS — R634 Abnormal weight loss: Secondary | ICD-10-CM

## 2022-08-10 DIAGNOSIS — Z8673 Personal history of transient ischemic attack (TIA), and cerebral infarction without residual deficits: Secondary | ICD-10-CM

## 2022-08-11 NOTE — Progress Notes (Signed)
See procedure note.

## 2022-08-12 ENCOUNTER — Ambulatory Visit (INDEPENDENT_AMBULATORY_CARE_PROVIDER_SITE_OTHER): Payer: Medicaid Other | Admitting: Podiatry

## 2022-08-12 ENCOUNTER — Encounter: Payer: Self-pay | Admitting: Podiatry

## 2022-08-12 DIAGNOSIS — E1142 Type 2 diabetes mellitus with diabetic polyneuropathy: Secondary | ICD-10-CM

## 2022-08-12 DIAGNOSIS — M79675 Pain in left toe(s): Secondary | ICD-10-CM | POA: Diagnosis not present

## 2022-08-12 DIAGNOSIS — M79674 Pain in right toe(s): Secondary | ICD-10-CM

## 2022-08-12 DIAGNOSIS — B351 Tinea unguium: Secondary | ICD-10-CM | POA: Diagnosis not present

## 2022-08-12 NOTE — Addendum Note (Signed)
Addended by: Star Age on: 08/12/2022 12:32 PM   Modules accepted: Orders

## 2022-08-12 NOTE — Progress Notes (Signed)
This patient returns to my office for at risk foot care.  This patient requires this care by a professional since this patient will be at risk due to having diabetic neuropathy.  This patient is unable to cut nails himself since the patient cannot reach his nails.These nails are painful walking and wearing shoes.  This patient presents for at risk foot care today.  General Appearance  Alert, conversant and in no acute stress.  Vascular  Dorsalis pedis and posterior tibial  pulses are palpable  bilaterally.  Capillary return is within normal limits  bilaterally. Temperature is within normal limits  bilaterally.  Neurologic  Senn-Weinstein monofilament wire test within normal limits  bilaterally. Muscle power within normal limits bilaterally.  Nails Thick disfigured discolored nails with subungual debris  from hallux to fifth toes bilaterally. No evidence of bacterial infection or drainage bilaterally.  Orthopedic  No limitations of motion  feet .  No crepitus or effusions noted.  No bony pathology or digital deformities noted.  Skin  normotropic skin with no porokeratosis noted bilaterally.  No signs of infections or ulcers noted.     Onychomycosis  Pain in right toes  Pain in left toes  Consent was obtained for treatment procedures.   Mechanical debridement of nails 1-5  bilaterally performed with a nail nipper.  Filed with dremel without incident.    Return office visit   4 months                   Told patient to return for periodic foot care and evaluation due to potential at risk complications.   Gardiner Barefoot DPM

## 2022-08-12 NOTE — Procedures (Signed)
[facility]  HOME SLEEP TEST (Watch PAT) REPORT  STUDY DATE: 08/10/2022  DOB: [birth year]  MRN: [id number]  ORDERING CLINICIAN: Star Age, MD, PhD   REFERRING CLINICIAN: [name], NP/Dr. Leta Baptist  CLINICAL INFORMATION/HISTORY: 57 year old right-handed gentleman with underlying complex medical history of seizure disorder, mood disorder, stroke, hypertension, diabetes, coronary artery disease with history of MI, Hx of SUD (by chart review), chronic back pain, cervical radiculopathy, and hyperlipidemia, who was previously diagnosed with obstructive sleep apnea.  He does not currently use a CPAP machine.   Epworth sleepiness score: 10/24.  BMI: 19.4 kg/m  FINDINGS:   Sleep Summary:   Total Recording Time (hours, min): 10 hours, 55 min  Total Sleep Time (hours, min):  9 hours, 46 min  Percent REM (%):    5%   Respiratory Indices:   Calculated pAHI (per hour):  19.1/hour         REM pAHI:    N/A       NREM pAHI: 19.1/hour  Central pAHI: 3.1/hour  Oxygen Saturation Statistics:    Oxygen Saturation (%) Mean: 95%   Minimum oxygen saturation (%):                 80%   O2 Saturation Range (%): 80-99%    O2 Saturation (minutes) <=88%: 0.3 min  Pulse Rate Statistics:   Pulse Mean (bpm):    65/min    Pulse Range (43- 112/min)   IMPRESSION: OSA (obstructive sleep apnea)   RECOMMENDATION:  This home sleep test demonstrates moderate obstructive sleep apnea with a total AHI of 19.1/hour and O2 nadir of 80%.  Intermittent mild to moderate snoring was detected, rarely in the louder range. Treatment with a positive airway pressure (PAP) device is recommended. The patient will be advised to proceed with an autoPAP titration/trial at home for now. A full night titration study may be considered to optimize treatment settings, monitor proper oxygen saturations and aid with improvement of tolerance and adherence, if needed down the road. Alternative  treatment options may include a dental device through dentistry or orthodontics in selected patients or Inspire (hypoglossal nerve stimulator) in carefully selected patients (meeting inclusion criteria).  Concomitant weight loss is recommended (where clinically appropriate). Please note that untreated obstructive sleep apnea may carry additional perioperative morbidity. Patients with significant obstructive sleep apnea should receive perioperative PAP therapy and the surgeons and particularly the anesthesiologist should be informed of the diagnosis and the severity of the sleep disordered breathing. The patient should be cautioned not to drive, work at heights, or operate dangerous or heavy equipment when tired or sleepy. Review and reiteration of good sleep hygiene measures should be pursued with any patient. Other causes of the patient's symptoms, including circadian rhythm disturbances, an underlying mood disorder, medication effect and/or an underlying medical problem cannot be ruled out based on this test. Clinical correlation is recommended.  The patient and his referring provider will be notified of the test results. The patient will be seen in follow up in sleep clinic at [facility].  I certify that I have reviewed the raw data recording prior to the issuance of this report in accordance with the standards of the American Academy of Sleep Medicine (AASM).  INTERPRETING PHYSICIAN:   Star Age, MD, PhD Medical Director, [facility] at [facility]) [place], ABPN (Neurology and Sleep)   [facility] [address], [place], [place] [address] [phone]

## 2022-08-17 ENCOUNTER — Telehealth: Payer: Self-pay | Admitting: *Deleted

## 2022-08-17 NOTE — Telephone Encounter (Signed)
-----  Message from Star Age, MD sent at 08/12/2022 12:32 PM EST ----- Patient referred by JM and Dr. Leta Baptist, seen by me on 04/19/2022, attempted PSG on 11/17 did not show enough sleep so we did a home sleep test on 08/10/2022.    Please call and notify the patient that the recent home sleep test showed obstructive sleep apnea in the moderate range. I recommend treatment in the form of autoPAP, which means, that we don't have to bring him in for a sleep study with CPAP, but will let him start using a so called autoPAP machine at home, which is a CPAP-like machine with self-adjusting pressures. We will send the order to a local DME company (of his choice, or as per insurance requirement). The DME representative will fit him with a mask, educate him on how to use the machine, how to put the mask on, etc. I have placed an order in the chart. Please send the order, talk to patient, send report to referring MD. We will need a FU in sleep clinic for 10 weeks post-PAP set up, please arrange that with me or one of our NPs. Also reinforce the need for compliance with treatment. Thanks,   Star Age, MD, PhD [facility])

## 2022-08-17 NOTE — Telephone Encounter (Signed)
I spoke with the pt's sister and discussed patient's sleep study results which show moderate obstructive sleep apnea.  The patient's sister is amenable to proceeding with AutoPap therapy.  We discussed the difference between AutoPap and CPAP.  We also discussed the next steps regarding using sending an order to a DME company.  She prefers to use [employer].  We discussed the insurance compliance requirements which includes using the machine at least 4 hours at night and also being seen in our office for an initial follow-up appointment between 30 and 90 days after set up.  She is aware to expect a call from both [place] and from our office to schedule both appointments.  She was encouraged to follow-up with [facility] if she does not hear from them within 1 week.  After her questions were answered and she verbalized appreciation for the call.  Referral faxed to [facility]. Received a receipt of confirmation. Results sent to [name] NP.   Please call pt's sister and schedule initial autopap f/u appt for approx 80 days from today's date.

## 2022-08-18 ENCOUNTER — Encounter (HOSPITAL_COMMUNITY)
Admission: RE | Admit: 2022-08-18 | Discharge: 2022-08-18 | Disposition: A | Discharge status: Home / Self Care | Payer: Medicaid Other | Source: Ambulatory Visit | Attending: Nephrology | Admitting: Nephrology

## 2022-08-18 VITALS — BP 102/73 | HR 62 | Temp 98.2°F | Resp 16

## 2022-08-18 DIAGNOSIS — N1832 Chronic kidney disease, stage 3b: Secondary | ICD-10-CM

## 2022-08-18 LAB — POCT HEMOGLOBIN-HEMACUE: Hemoglobin: 9.4 g/dL — ABNORMAL LOW (ref 13.0–17.0)

## 2022-08-18 MED ORDER — EPOETIN ALFA 10000 UNIT/ML IJ SOLN
5000.0000 [IU] | Freq: Once | INTRAMUSCULAR | Status: AC
  Administered 2022-08-18: 5000 [IU] via SUBCUTANEOUS
  Filled 2022-08-18: qty 1

## 2022-08-18 NOTE — Progress Notes (Signed)
Diagnosis: Anemia in Chronic Kidney Disease  Provider:  Manpreet Bhutani MD  Procedure: Injection  Epogen (epoetin alfa), Dose: 5000u , Site: subcutaneous, Number of injections: 1  HGB 9.4  Post Care: Patient declined observation  Discharge: Condition: Good, Destination: Home . AVS provided to patient.   Performed by:  Grayland Ormond, RN

## 2022-08-18 NOTE — Telephone Encounter (Signed)
Called pt sister. Left a VM message to please call office.

## 2022-08-22 ENCOUNTER — Encounter (HOSPITAL_COMMUNITY): Payer: Self-pay

## 2022-08-22 ENCOUNTER — Other Ambulatory Visit: Payer: Self-pay | Admitting: Internal Medicine

## 2022-08-22 NOTE — Progress Notes (Signed)
Plan of Care Note for accepted transfer   Patient: Melvin Taylor MRN: [id number]   DOA: (Not on file)  Facility requesting transfer: [facility] Requesting Provider: Dr. Daphene Jaeger Reason for transfer: Specialty services requested, neurosurgery (possibly neurology) Facility course: Patient presented on 1/21 with altered mental status.  Patient had been okay in the morning when checked on and when someone came to check on him in the afternoon he was found to be altered.  Initially presented COVID-positive with fever to 100.4 and this was up to be the primary etiology.  Also was hypoglycemic.   Fever has improved and patient is on remdesivir and Decadron for his COVID without significant hypoxia.  Is also received D5 for his hypoglycemia. Patient also has known history of seizure disorder on Depakote, Keppra, Lamictal and had evidence of subtherapeutic Depakote levels and possible seizure with CK elevated to 1600 on first check now downtrending.  He is still alert but remains sluggish and off from baseline today.  Initial CT head looked okay but due to persistent issues additional imaging was obtained.   MR brain today showed questionable small infarct but was more significant for new subdural hematoma versus empyema with 4 mm maximum thickness.  CT head obtained again which confirmed hematoma with 4 mm maximum thickness not suggestive of empyema.  Due to lack of neurosurgical or neurology consultative services transfer requested to [facility] for further evaluation and treatment.  Not yet discussed with neurosurgery so I asked that requesting provider speak with them first but if they approve of need for transfer and evaluation here given development and worsening of subdural hematoma overnight patient will be tentatively accepted.  Pending neurosurgery response, may also need to be discussed with neurology.  Plan of care: The patient is tentatively accepted for admission to Progressive unit,  at [facility]..   Author: Marcelyn Bruins, MD 08/22/2022  Check [email or web address] for on-call coverage.  Nursing staff, Please call [place] number on Amion as soon as patient's arrival, so appropriate admitting provider can evaluate the pt.

## 2022-08-23 ENCOUNTER — Ambulatory Visit: Payer: Medicaid Other | Admitting: "Endocrinology

## 2022-08-26 ENCOUNTER — Telehealth: Payer: Self-pay | Admitting: Adult Health

## 2022-08-26 NOTE — Telephone Encounter (Signed)
[name] called wanting to know if his provider can get him switched from [facility] in [place] to a [facility]. hospital here in [place] in the ICU. Pt has a brain bleed and [place] is not equip to treat it. Please advise.

## 2022-08-27 ENCOUNTER — Encounter (HOSPITAL_COMMUNITY): Payer: Self-pay | Admitting: Internal Medicine

## 2022-08-27 ENCOUNTER — Other Ambulatory Visit: Payer: Self-pay

## 2022-08-27 ENCOUNTER — Inpatient Hospital Stay (HOSPITAL_COMMUNITY): Payer: Medicaid Other

## 2022-08-27 ENCOUNTER — Inpatient Hospital Stay (HOSPITAL_COMMUNITY)
Admission: RE | Admit: 2022-08-27 | Discharge: 2022-08-31 | DRG: 064 | Disposition: A | Discharge status: Rehabilitation Facility | Payer: Medicaid Other | Source: Other Acute Inpatient Hospital | Attending: Family Medicine | Admitting: Family Medicine

## 2022-08-27 DIAGNOSIS — Z8249 Family history of ischemic heart disease and other diseases of the circulatory system: Secondary | ICD-10-CM | POA: Diagnosis not present

## 2022-08-27 DIAGNOSIS — Z7984 Long term (current) use of oral hypoglycemic drugs: Secondary | ICD-10-CM

## 2022-08-27 DIAGNOSIS — E1142 Type 2 diabetes mellitus with diabetic polyneuropathy: Secondary | ICD-10-CM | POA: Diagnosis present

## 2022-08-27 DIAGNOSIS — E1159 Type 2 diabetes mellitus with other circulatory complications: Secondary | ICD-10-CM | POA: Diagnosis present

## 2022-08-27 DIAGNOSIS — S065XAA Traumatic subdural hemorrhage with loss of consciousness status unknown, initial encounter: Secondary | ICD-10-CM

## 2022-08-27 DIAGNOSIS — Z794 Long term (current) use of insulin: Secondary | ICD-10-CM | POA: Diagnosis not present

## 2022-08-27 DIAGNOSIS — Z951 Presence of aortocoronary bypass graft: Secondary | ICD-10-CM | POA: Diagnosis not present

## 2022-08-27 DIAGNOSIS — Z602 Problems related to living alone: Secondary | ICD-10-CM | POA: Diagnosis present

## 2022-08-27 DIAGNOSIS — Z87891 Personal history of nicotine dependence: Secondary | ICD-10-CM

## 2022-08-27 DIAGNOSIS — E11649 Type 2 diabetes mellitus with hypoglycemia without coma: Secondary | ICD-10-CM | POA: Diagnosis not present

## 2022-08-27 DIAGNOSIS — G40509 Epileptic seizures related to external causes, not intractable, without status epilepticus: Secondary | ICD-10-CM | POA: Diagnosis present

## 2022-08-27 DIAGNOSIS — Z681 Body mass index (BMI) 19 or less, adult: Secondary | ICD-10-CM

## 2022-08-27 DIAGNOSIS — F0630 Mood disorder due to known physiological condition, unspecified: Secondary | ICD-10-CM | POA: Diagnosis not present

## 2022-08-27 DIAGNOSIS — G4733 Obstructive sleep apnea (adult) (pediatric): Secondary | ICD-10-CM | POA: Diagnosis present

## 2022-08-27 DIAGNOSIS — I10 Essential (primary) hypertension: Secondary | ICD-10-CM | POA: Diagnosis not present

## 2022-08-27 DIAGNOSIS — Z79899 Other long term (current) drug therapy: Secondary | ICD-10-CM | POA: Diagnosis not present

## 2022-08-27 DIAGNOSIS — I6389 Other cerebral infarction: Secondary | ICD-10-CM | POA: Diagnosis not present

## 2022-08-27 DIAGNOSIS — Z8616 Personal history of COVID-19: Secondary | ICD-10-CM

## 2022-08-27 DIAGNOSIS — G40909 Epilepsy, unspecified, not intractable, without status epilepticus: Secondary | ICD-10-CM

## 2022-08-27 DIAGNOSIS — E1169 Type 2 diabetes mellitus with other specified complication: Secondary | ICD-10-CM | POA: Diagnosis not present

## 2022-08-27 DIAGNOSIS — I69351 Hemiplegia and hemiparesis following cerebral infarction affecting right dominant side: Secondary | ICD-10-CM

## 2022-08-27 DIAGNOSIS — I639 Cerebral infarction, unspecified: Secondary | ICD-10-CM | POA: Diagnosis not present

## 2022-08-27 DIAGNOSIS — E43 Unspecified severe protein-calorie malnutrition: Secondary | ICD-10-CM | POA: Diagnosis present

## 2022-08-27 DIAGNOSIS — U071 COVID-19: Secondary | ICD-10-CM | POA: Diagnosis present

## 2022-08-27 DIAGNOSIS — D649 Anemia, unspecified: Secondary | ICD-10-CM | POA: Diagnosis not present

## 2022-08-27 DIAGNOSIS — R269 Unspecified abnormalities of gait and mobility: Secondary | ICD-10-CM | POA: Diagnosis not present

## 2022-08-27 DIAGNOSIS — E875 Hyperkalemia: Secondary | ICD-10-CM | POA: Diagnosis not present

## 2022-08-27 DIAGNOSIS — E782 Mixed hyperlipidemia: Secondary | ICD-10-CM | POA: Diagnosis present

## 2022-08-27 DIAGNOSIS — E785 Hyperlipidemia, unspecified: Secondary | ICD-10-CM | POA: Diagnosis present

## 2022-08-27 DIAGNOSIS — F209 Schizophrenia, unspecified: Secondary | ICD-10-CM | POA: Diagnosis not present

## 2022-08-27 DIAGNOSIS — I6200 Nontraumatic subdural hemorrhage, unspecified: Principal | ICD-10-CM | POA: Diagnosis present

## 2022-08-27 DIAGNOSIS — I2510 Atherosclerotic heart disease of native coronary artery without angina pectoris: Secondary | ICD-10-CM | POA: Diagnosis present

## 2022-08-27 DIAGNOSIS — R4182 Altered mental status, unspecified: Secondary | ICD-10-CM | POA: Diagnosis not present

## 2022-08-27 DIAGNOSIS — Z823 Family history of stroke: Secondary | ICD-10-CM

## 2022-08-27 DIAGNOSIS — N179 Acute kidney failure, unspecified: Secondary | ICD-10-CM | POA: Diagnosis not present

## 2022-08-27 DIAGNOSIS — E871 Hypo-osmolality and hyponatremia: Secondary | ICD-10-CM | POA: Diagnosis not present

## 2022-08-27 DIAGNOSIS — Z886 Allergy status to analgesic agent status: Secondary | ICD-10-CM | POA: Diagnosis not present

## 2022-08-27 DIAGNOSIS — Z833 Family history of diabetes mellitus: Secondary | ICD-10-CM

## 2022-08-27 DIAGNOSIS — I25810 Atherosclerosis of coronary artery bypass graft(s) without angina pectoris: Secondary | ICD-10-CM | POA: Diagnosis not present

## 2022-08-27 DIAGNOSIS — G9341 Metabolic encephalopathy: Secondary | ICD-10-CM | POA: Diagnosis present

## 2022-08-27 HISTORY — DX: Epilepsy, unspecified, not intractable, without status epilepticus: G40.909

## 2022-08-27 HISTORY — DX: Traumatic subdural hemorrhage with loss of consciousness status unknown, initial encounter: S06.5XAA

## 2022-08-27 HISTORY — DX: Cerebral infarction, unspecified: I63.9

## 2022-08-27 LAB — HIV ANTIBODY (ROUTINE TESTING W REFLEX): HIV Screen 4th Generation wRfx: NONREACTIVE

## 2022-08-27 LAB — GLUCOSE, CAPILLARY
Glucose-Capillary: 227 mg/dL — ABNORMAL HIGH (ref 70–99)
Glucose-Capillary: 257 mg/dL — ABNORMAL HIGH (ref 70–99)
Glucose-Capillary: 230 mg/dL — ABNORMAL HIGH (ref 70–99)

## 2022-08-27 LAB — RESP PANEL BY RT-PCR (RSV, FLU A&B, COVID)  RVPGX2
Influenza A by PCR: NEGATIVE
Influenza B by PCR: NEGATIVE
Resp Syncytial Virus by PCR: NEGATIVE
SARS Coronavirus 2 by RT PCR: POSITIVE — AB

## 2022-08-27 LAB — VALPROIC ACID LEVEL: Valproic Acid Lvl: 66 ug/mL (ref 50.0–100.0)

## 2022-08-27 MED ORDER — SODIUM CHLORIDE 0.9 % IV SOLN: INTRAVENOUS | Status: DC

## 2022-08-27 MED ORDER — ACETAMINOPHEN 160 MG/5ML PO SOLN: 650.0000 mg | ORAL | Status: DC | PRN

## 2022-08-27 MED ORDER — LEVETIRACETAM IN NACL 1000 MG/100ML IV SOLN
1000.0000 mg | Freq: Two times a day (BID) | INTRAVENOUS | Status: DC
  Administered 2022-08-27 – 2022-08-30 (×7): 1000 mg via INTRAVENOUS
  Filled 2022-08-27 (×7): qty 100

## 2022-08-27 MED ORDER — LAMOTRIGINE 25 MG PO TABS: 50.0000 mg | ORAL_TABLET | Freq: Two times a day (BID) | ORAL | Status: DC

## 2022-08-27 MED ORDER — SODIUM CHLORIDE 0.9% FLUSH: 10.0000 mL | INTRAVENOUS | Status: DC | PRN

## 2022-08-27 MED ORDER — INSULIN ASPART 100 UNIT/ML IJ SOLN
0.0000 [IU] | Freq: Every day | INTRAMUSCULAR | Status: DC
  Administered 2022-08-27: 2 [IU] via SUBCUTANEOUS
  Administered 2022-08-29: 3 [IU] via SUBCUTANEOUS

## 2022-08-27 MED ORDER — DIVALPROEX SODIUM 250 MG PO DR TAB: 500.0000 mg | DELAYED_RELEASE_TABLET | Freq: Two times a day (BID) | ORAL | Status: DC

## 2022-08-27 MED ORDER — CHLORHEXIDINE GLUCONATE CLOTH 2 % EX PADS
6.0000 | MEDICATED_PAD | Freq: Every day | CUTANEOUS | Status: DC
  Administered 2022-08-27 – 2022-08-31 (×5): 6 via TOPICAL

## 2022-08-27 MED ORDER — ISOSORBIDE MONONITRATE ER 30 MG PO TB24
30.0000 mg | ORAL_TABLET | Freq: Every day | ORAL | Status: DC
  Administered 2022-08-28 – 2022-08-30 (×3): 30 mg via ORAL
  Filled 2022-08-27 (×4): qty 1

## 2022-08-27 MED ORDER — SODIUM CHLORIDE 0.9% FLUSH
10.0000 mL | Freq: Two times a day (BID) | INTRAVENOUS | Status: DC
  Administered 2022-08-27 – 2022-08-31 (×8): 10 mL

## 2022-08-27 MED ORDER — ALBUTEROL SULFATE (2.5 MG/3ML) 0.083% IN NEBU: 3.0000 mL | INHALATION_SOLUTION | RESPIRATORY_TRACT | Status: DC | PRN

## 2022-08-27 MED ORDER — STROKE: EARLY STAGES OF RECOVERY BOOK
Freq: Once | Status: AC
  Filled 2022-08-27: qty 1

## 2022-08-27 MED ORDER — ACETAMINOPHEN 650 MG RE SUPP: 650.0000 mg | RECTAL | Status: DC | PRN

## 2022-08-27 MED ORDER — ESCITALOPRAM OXALATE 10 MG PO TABS
20.0000 mg | ORAL_TABLET | Freq: Every day | ORAL | Status: DC
  Administered 2022-08-28 – 2022-08-30 (×3): 20 mg via ORAL
  Filled 2022-08-27 (×4): qty 2

## 2022-08-27 MED ORDER — ACETAMINOPHEN 325 MG PO TABS: 650.0000 mg | ORAL_TABLET | ORAL | Status: DC | PRN

## 2022-08-27 MED ORDER — CYCLOSPORINE 0.05 % OP EMUL
1.0000 [drp] | Freq: Two times a day (BID) | OPHTHALMIC | Status: DC
  Administered 2022-08-27 – 2022-08-31 (×8): 1 [drp] via OPHTHALMIC
  Filled 2022-08-27 (×10): qty 30

## 2022-08-27 MED ORDER — ATORVASTATIN CALCIUM 80 MG PO TABS
80.0000 mg | ORAL_TABLET | Freq: Every day | ORAL | Status: DC
  Administered 2022-08-28 – 2022-08-30 (×3): 80 mg via ORAL
  Filled 2022-08-27 (×4): qty 1

## 2022-08-27 MED ORDER — INSULIN ASPART 100 UNIT/ML IJ SOLN
0.0000 [IU] | Freq: Three times a day (TID) | INTRAMUSCULAR | Status: DC
  Administered 2022-08-27: 8 [IU] via SUBCUTANEOUS
  Administered 2022-08-28: 3 [IU] via SUBCUTANEOUS
  Administered 2022-08-29: 5 [IU] via SUBCUTANEOUS
  Administered 2022-08-30: 11 [IU] via SUBCUTANEOUS
  Administered 2022-08-30: 3 [IU] via SUBCUTANEOUS

## 2022-08-27 MED ORDER — SENNOSIDES-DOCUSATE SODIUM 8.6-50 MG PO TABS: 1.0000 | ORAL_TABLET | Freq: Every evening | ORAL | Status: DC | PRN

## 2022-08-27 NOTE — Consult Note (Signed)
NEURO HOSPITALIST CONSULT NOTE   Requestig physician: Dr. Lorin Mercy  Reason for Consult: Aphasia  History obtained from:  Sister and Chart     HPI:                                                                                                                                          Melvin Taylor is a 57 y.o. 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   Additional history from Dr. Lorin Mercy' note has been reviewed: "Melvin Taylor is a 57 y.o. male with medical history significant of bipolar d/o vs/ schizophrenia, chronic chest wall/neck/back pain, CAD s/p CABG, DM, HTN, polysubstance abuse, OSA, and seizure d/o presenting with AMS.  He was admitted to [facility] from 1/20-27 with AMS, minimally improving during hospitalization.  He was noted to have a persistent punctate focus of restricted diffusion in the L medial temporal lobe, concerning for acute/subacute CVA.  MRI/CT with SDH that developed during the hospitalization, 4 mm in L occipital region.  He has been on IV Keppra throughout the hospitalization without apparent seizure activity.  He is able to answer very limited questions, none about orientation.  He does follow some commands. I spoke with his sister.  He only answers direct questions.  A week ago today, his aide fed him breakfast and he ate well.  He has multiple medical problems, has a [place] aide for assistance at home.  At baseline he is able to walk, has a lift chair, have conversations, play video games.  His aide usually comes back at night to help him take his medications - but stopped by early, about  5pm.  He found him sitting in the floor propped against the bed.  Glucose 52.  ?fall - bruises on nose and forehead.  Given Gatorade with improved glucose, was able to get up and walk to his chair.  EMS came to check on him, he requested to go to the hospital.  Wednesday night, he was able to talk some, knew sister's name and childhood nicknames.  It took him a few minutes to answer, which was out of character for him.  He had no symptoms of COVID despite the positive test - she would like for him to be retested.  CT Saturday, no sign of CVA.  She asked for MRI - said he had a brain bleed.  He was on Plavix vs. Brilinta at home.  Prior to this, he had full mobility without  deficits.  She noticed last night that he had R hemiplegia and contracture."    Past Medical History:  Diagnosis Date   Bipolar 1 disorder (HCC)    depression/anxiety   Bone spur    left heel   Cervical radiculopathy    Chronic back pain    Chronic chest wall pain    Chronic neck pain    Coronary artery disease    a. s/p CABG in 03/2016 with LIMA-LAD, SVG-D1, SVG-RCA, and Seq SVG-mid and distal OM   Diabetes mellitus    Type II   Hallucinations    "long history of them"   Headache(784.0)    History of gout    HTN (hypertension)    Insomnia    Neuropathy    Pain management    Polysubstance abuse ([place])    Right leg pain    chronic   Schizophrenia ([place])    Seizures ([place])    last sz between July 5-9th, 2016; epilepsy   Sleep apnea    Stroke ([place])    Transfusion of blood product refused for religious reason     Past Surgical History:  Procedure Laterality Date   ANTERIOR CERVICAL DECOMP/DISCECTOMY FUSION N/A 12/14/2015   Procedure: ANTERIOR CERVICAL DECOMPRESSION/DISCECTOMY FUSION CERVICAL FIVE -SIX;  Surgeon: Earnie Larsson, MD;  Location: [facility] NEURO ORS;  Service: Neurosurgery;  Laterality: N/A;   BIOPSY  05/07/2015   Procedure: BIOPSY (Gastric);  Surgeon: [name], MD;  Location: [facility] ORS;  Service: Endoscopy;;    CARDIAC CATHETERIZATION N/A 03/07/2016   Procedure: Left Heart Cath and Coronary Angiography;  Surgeon: Belva Crome, MD;  Location: [facility] CV LAB;  Service: Cardiovascular;  Laterality: N/A;   COLONOSCOPY WITH PROPOFOL N/A 05/07/2015   PIR:JJOACZYSAY diverticulosis, multiple colon polyps removed, tubular adenoma, serrated colon polyp. Next colonoscopy October 2019   COLONOSCOPY WITH PROPOFOL N/A 08/02/2018   Procedure: COLONOSCOPY WITH PROPOFOL;  Surgeon: [name], MD;  Location: [facility] ENDO SUITE;  Service: Endoscopy;  Laterality: N/A;  12:00pm   CORONARY ARTERY BYPASS GRAFT N/A 03/28/2016   Procedure: CORONARY ARTERY BYPASS GRAFTING (CABG) x 5 USING GREATER SAPHENOUS VEIN;  Surgeon: [name], MD;  Location: [facility] OR;  Service: Open Heart Surgery;  Laterality: N/A;   ENDOVEIN HARVEST OF GREATER SAPHENOUS VEIN Right 03/28/2016   Procedure: ENDOVEIN HARVEST OF GREATER SAPHENOUS VEIN;  Surgeon: [name], MD;  Location: [facility];  Service: Open Heart Surgery;  Laterality: Right;   ESOPHAGEAL DILATION N/A 05/07/2015   Procedure: ESOPHAGEAL DILATION WITH 56FR MALONEY DILATOR;  Surgeon: [name], MD;  Location: [facility] ORS;  Service: Endoscopy;  Laterality: N/A;   ESOPHAGOGASTRODUODENOSCOPY (EGD) WITH PROPOFOL N/A 05/07/2015   RMR: Status post dilation of normal esophagus. Gastritis.   IR CT HEAD LTD  12/17/2020   IR CT HEAD LTD  12/17/2020   IR INTRA CRAN STENT  12/17/2020   IR PERCUTANEOUS ART THROMBECTOMY/INFUSION INTRACRANIAL INC DIAG ANGIO  12/17/2020   IR RADIOLOGIST EVAL & MGMT  03/25/2021   KNEE SURGERY Left    arthroscopy   MANDIBLE FRACTURE SURGERY     POLYPECTOMY  05/07/2015   Procedure: POLYPECTOMY (Hepatic Flexure, Distal Transverse Colon, Rectal);  Surgeon: [name], MD;  Location: [facility];  Service: Endoscopy;;   RADIOLOGY WITH ANESTHESIA N/A 12/16/2020   Procedure: IR WITH ANESTHESIA;  Surgeon: Luanne Bras, MD;  Location: [facility];  Service: Radiology;  Laterality: N/A;    TEE WITHOUT CARDIOVERSION N/A 03/28/2016  Procedure: TRANSESOPHAGEAL ECHOCARDIOGRAM (TEE);  Surgeon: [name], MD;  Location: [facility];  Service: Open Heart Surgery;  Laterality: N/A;   TONSILLECTOMY     TRIGGER FINGER RELEASE Left 10/15/2021   Procedure: RELEASE TRIGGER FINGER/A-1 PULLEY left middle or long finger;  Surgeon: Carole Civil, MD;  Location: [facility];  Service: Orthopedics;  Laterality: Left;    Family History  Problem Relation Age of Onset   Diabetes Father    Hypertension Father    Sleep apnea Father    Stroke Sister    Arthritis Other    Diabetes Other    Asthma Other              Social History:  reports that he has quit smoking. His smoking use included cigarettes. He has a 15.00 pack-year smoking history. He has never used smokeless tobacco. He reports that he does not currently use alcohol. He reports that he does not currently use drugs after having used the following drugs: Marijuana.  Allergies  Allergen Reactions   Aspirin Swelling and Other (See Comments)    Facial swelling     MEDICATIONS:                                                                                                                     Scheduled:   stroke: early stages of recovery book   Does not apply Once   [START ON 08/28/2022] atorvastatin  80 mg Oral QHS   Chlorhexidine Gluconate Cloth  6 each Topical Daily   cycloSPORINE  1 drop Both Eyes BID   [START ON 08/28/2022] escitalopram  20 mg Oral Daily   insulin aspart  0-15 Units Subcutaneous TID WC   insulin aspart  0-5 Units Subcutaneous QHS   [START ON 08/28/2022] isosorbide mononitrate  30 mg Oral Daily   sodium chloride flush  10-40 mL Intracatheter Q12H   Continuous:  sodium chloride     levETIRAcetam       ROS:                                                                                                                                       Unable to obtain due to mutism.    Blood pressure (!) 168/89, pulse  (!) 59, temperature 98.2 F (36.8 C), temperature source Oral, resp. rate 12, weight 52.9 kg, SpO2 100 %.   General Examination:  Physical Exam  HEENT-  Normocephalic. Small healing abrasions to face.    Lungs- Respirations unlabored Extremities- No edema  Neurological Examination Mental Status: Awake and alert. Mute without any speech output. Abulic with little spontaneous movement. Eyes are open and he will attend to his sister, but not the examiner. Poor eye contact, but will glance at visual stimuli. Follows about 20% of simple commands. No agitation noted.  Alert, oriented, thought content appropriate.  Speech fluent without evidence of aphasia.  Able to follow 3 step commands without difficulty. Cranial Nerves: II: Will glance toward visual stimuli in his peripheral fields. PERRL.   III,IV, VI: EOMI. No nystagmus.  V: Unable to formally assess due to mutism.  VII: Facial movements are symmetric while chewing food.  VIII: Will alert to sister's voice IX,X: Gag reflex deferred XI: Head is midline XII: Does not protrude tongue to command Motor: BUE will remain elevated for several seconds after examiner lifts and releases them.  Withdraws BLE to noxious.  No definite asymmetry.  No tremor or asterixis noted.  Sensory: Reacts to touch x 4.  Deep Tendon Reflexes: 2+ and symmetric throughout Plantars: Right: downgoing   Left: downgoing Cerebellar: No gross ataxia noted.  Gait: Deferred due to falls risk concerns.    Lab Results: Basic Metabolic Panel: No results for input(s): "NA", "K", "CL", "CO2", "GLUCOSE", "BUN", "CREATININE", "CALCIUM", "MG", "PHOS" in the last 168 hours.  CBC: No results for input(s): "WBC", "NEUTROABS", "HGB", "HCT", "MCV", "PLT" in the last 168 hours.  Cardiac Enzymes: No results for input(s): "CKTOTAL", "CKMB", "CKMBINDEX", "TROPONINI" in the last  168 hours.  Lipid Panel: No results for input(s): "CHOL", "TRIG", "HDL", "CHOLHDL", "VLDL", "LDLCALC" in the last 168 hours.  Imaging: No results found.   Assessment: 57 y.o. 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 Of note, he is Covid positive but has not had any symptoms.  - Exam reveals a mute and abulic patient without any definite limb weakness, facial droop or ataxia.  - DDx: - Overall presentation is suggestive of catatonia.  - Unusual manifestation of seizure is also possible, given his history of epilepsy - Per Dr. Hewitt Shorts review of the outside imaging studies, the head CT's and the MRI from 1/22 showed a minuscule subdural blood collection. The scans done today and yesterday showed no change. The reason for the patient's altered mental status is not felt to be due to the diminutive amount of blood. CT angio done today confirmed the stable hematoma, per Dr. Cyndy Freeze.  - Brilinta was stopped due to the hematoma. Most likely the risks of stent restenosis and stroke if there is prolonged cessation of this medication would significantly outweigh the risk of hemorrhage expansion while on Brilinta. Will obtain repeat CT head and if hematoma remains small, most likely will resume Brilinta.   Recommendations: - CT head has been ordered.  - EEG (ordered) - Restart Brilinta if CT shows small and stable hematoma - Lamictal and VPA home doses are listed in Epic, but he is not currently on these per [facility] and orders list.  - Keppra has been continued by primary team this admission. It was started at the OSH. After EEG, will need  to optimize the anticonvulsant regimen, most likely discontinuing Keppra and continuing VPA.  - Lamictal and VPA levels have been ordered.    Addendum: - CT head: Stable trace subdural hematoma along the posterior falx and posterior the occipital lobe. 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 Stable left MCA stent. Left mastoid effusion. - EEG: Completed with report pending.  - VPA level therapeutic at 66 - Lamotrigine level is pending - Brilinta can be restarted. Risks of expansion of the tiny subdural while on Brilinta are significantly lower than the risk of stent restenosis and massive stroke if off Brilinta    Electronically signed: Dr. Kerney Elbe 08/27/2022, 3:41 PM

## 2022-08-27 NOTE — H&P (Addendum)
History and Physical    Patient: Melvin Taylor RDE:[id number] DOB: 09/09/1965 DOA: 08/27/2022 DOS: the patient was seen and examined on 08/27/2022 PCP: [name], [place]  Patient coming from: Home - lives alone, has home health aide 53 hours a week; NOK: Renda Rolls, [phone]   Chief Complaint: AMS  HPI: Melvin Taylor is a 57 y.o. male with medical history significant of bipolar d/o vs/ schizophrenia, chronic chest wall/neck/back pain, CAD s/p CABG, DM, HTN, polysubstance abuse, OSA, and seizure d/o presenting with AMS.  He was admitted to [facility] from 1/20-27 with AMS, minimally improving during hospitalization.  He was noted to have a persistent punctate focus of restricted diffusion in the L medial temporal lobe, concerning for acute/subacute CVA.  MRI/CT with SDH that developed during the hospitalization, 4 mm in L occipital region.  He has been on IV Keppra throughout the hospitalization without apparent seizure activity.  He is able to answer very limited questions, none about orientation.  He does follow some commands.    I spoke with his sister.  He only answers direct questions.  A week ago today, his aide fed him breakfast and he ate well.  He has multiple medical problems, has a [place] aide for assistance at home.  At baseline he is able to walk, has a lift chair, have conversations, play video games.  His aide usually comes back at night to help him take his medications - but stopped by early, about 5pm.  He found him sitting in the floor propped against the bed.  Glucose 52.  ?fall - bruises on nose and forehead.  Given Gatorade with improved glucose, was able to get up and walk to his chair.  EMS came to check on him, he requested to go to the hospital.  Wednesday night, he was able to talk some, knew sister's name and childhood nicknames.  It took him a few minutes to answer, which was out of character for him.  He had no symptoms of COVID despite the positive test -  she would like for him to be retested.  CT Saturday, no sign of CVA.  She asked for MRI - said he had a brain bleed.  He was on Plavix vs. Brilinta at home.  Prior to this, he had full mobility without deficits.  She noticed last night that he had R hemiplegia and contracture.      ER Course:  [facility] to [facility] transfer, per Dr. Trilby Drummer:  AMS on 1/21 - went to [facility] - ok in AM, altered in PM.  Thought to be related to COVID + fever (100.4), hypoglycemia.  Given remdesivir and Decadron and D5.  Also with known h/o seizures and subtherapeutic Depakote levels, CK 1600 but downtrending.  Still altered so MRI with new subdural hematoma versus empyema with 4 mm maximum thickness.  CT head obtained again which confirmed hematoma with 4 mm maximum thickness not suggestive of empyema.   Due to lack of neurosurgical or neurology consultative services transfer requested to [facility] for further evaluation and treatment.  Pending neurosurgery response, may also need to be discussed with neurology.     Review of Systems: unable to review all systems due to the inability of the patient to answer questions. Past Medical History:  Diagnosis Date   Bipolar 1 disorder (HCC)    depression/anxiety   Bone spur    left heel   Cervical radiculopathy    Chronic back pain    Chronic chest  wall pain    Chronic neck pain    Coronary artery disease    a. s/p CABG in 03/2016 with LIMA-LAD, SVG-D1, SVG-RCA, and Seq SVG-mid and distal OM   Diabetes mellitus    Type II   Hallucinations    "long history of them"   Headache(784.0)    History of gout    HTN (hypertension)    Insomnia    Neuropathy    Pain management    Polysubstance abuse ([place])    Right leg pain    chronic   Schizophrenia ([place])    Seizures ([place])    last sz between July 5-9th, 2016; epilepsy   Sleep apnea    Stroke ([place])    Transfusion of blood product refused for religious reason    Past Surgical History:  Procedure Laterality Date   ANTERIOR  CERVICAL DECOMP/DISCECTOMY FUSION N/A 12/14/2015   Procedure: ANTERIOR CERVICAL DECOMPRESSION/DISCECTOMY FUSION CERVICAL FIVE -SIX;  Surgeon: Earnie Larsson, MD;  Location: [place] NEURO ORS;  Service: Neurosurgery;  Laterality: N/A;   BIOPSY  05/07/2015   Procedure: BIOPSY (Gastric);  Surgeon: [name], MD;  Location: [facility] OR[facility];  Service: Endoscopy;;   CARDIAC CATHETERIZATION N/A 03/07/2016   Procedure: Left Heart Cath and Coronary Angiography;  Surgeon: Belva Crome, MD;  Location: [place] CV LAB;  Service: Cardiovascular;  Laterality: N/A;   COLONOSCOPY WITH PROPOFOL N/A 05/07/2015   QPY:[id number] diverticulosis, multiple colon polyps removed, tubular adenoma, serrated colon polyp. Next colonoscopy October 2019   COLONOSCOPY WITH PROPOFOL N/A 08/02/2018   Procedure: COLONOSCOPY WITH PROPOFOL;  Surgeon: [name], MD;  Location: [facility] ENDO [facility];  Service: Endoscopy;  Laterality: N/A;  12:00pm   CORONARY ARTERY BYPASS GRAFT N/A 03/28/2016   Procedure: CORONARY ARTERY BYPASS GRAFTING (CABG) x 5 USING GREATER SAPHENOUS VEIN;  Surgeon: [name], MD;  Location: [place] OR;  Service: Open Heart Surgery;  Laterality: N/A;   ENDOVEIN HARVEST OF GREATER SAPHENOUS VEIN Right 03/28/2016   Procedure: ENDOVEIN HARVEST OF GREATER SAPHENOUS VEIN;  Surgeon: [name], MD;  Location: [place];  Service: Open Heart Surgery;  Laterality: Right;   ESOPHAGEAL DILATION N/A 05/07/2015   Procedure: ESOPHAGEAL DILATION WITH 56FR MALONEY DILATOR;  Surgeon: [name], MD;  Location: [facility];  Service: Endoscopy;  Laterality: N/A;   ESOPHAGOGASTRODUODENOSCOPY (EGD) WITH PROPOFOL N/A 05/07/2015   RMR: Status post dilation of normal esophagus. Gastritis.   IR CT HEAD LTD  12/17/2020   IR CT HEAD LTD  12/17/2020   IR INTRA CRAN STENT  12/17/2020   IR PERCUTANEOUS ART THROMBECTOMY/INFUSION INTRACRANIAL INC DIAG ANGIO  12/17/2020   IR RADIOLOGIST EVAL & MGMT  03/25/2021   KNEE SURGERY Left    arthroscopy   MANDIBLE FRACTURE  SURGERY     POLYPECTOMY  05/07/2015   Procedure: POLYPECTOMY (Hepatic Flexure, Distal Transverse Colon, Rectal);  Surgeon: [name], MD;  Location: [facility];  Service: Endoscopy;;   RADIOLOGY WITH ANESTHESIA N/A 12/16/2020   Procedure: IR WITH ANESTHESIA;  Surgeon: Luanne Bras, MD;  Location: [place];  Service: Radiology;  Laterality: N/A;   TEE WITHOUT CARDIOVERSION N/A 03/28/2016   Procedure: TRANSESOPHAGEAL ECHOCARDIOGRAM (TEE);  Surgeon: [name], MD;  Location: [place];  Service: Open Heart Surgery;  Laterality: N/A;   TONSILLECTOMY     TRIGGER FINGER RELEASE Left 10/15/2021   Procedure: RELEASE TRIGGER FINGER/A-1 PULLEY left middle or long finger;  Surgeon: Carole Civil, MD;  Location: [facility];  Service: Orthopedics;  Laterality: Left;   Social History:  reports that he has quit smoking. His smoking use included cigarettes. He has a 15.00 pack-year smoking history. He has never used smokeless tobacco. He reports that he does not currently use alcohol. He reports that he does not currently use drugs after having used the following drugs: Marijuana.  Allergies  Allergen Reactions   Aspirin Swelling and Other (See Comments)    Facial swelling     Family History  Problem Relation Age of Onset   Diabetes Father    Hypertension Father    Sleep apnea Father    Stroke Sister    Arthritis Other    Diabetes Other    Asthma Other     Prior to Admission medications   Medication Sig Start Date End Date Taking? Authorizing Provider  albuterol (PROVENTIL HFA;VENTOLIN HFA) 108 (90 Base) MCG/ACT inhaler Inhale 2 puffs into the lungs every 4 (four) hours as needed for wheezing or shortness of breath.     [provider]  amLODipine (NORVASC) 10 MG tablet Take 1 tablet (10 mg total) by mouth daily. 02/09/21   Horton, Barbette Hair, MD  atorvastatin (LIPITOR) 80 MG tablet Take 1 tablet (80 mg total) by mouth at bedtime. 12/30/20   Love, Ivan Anchors, PA-C  Blood Glucose Monitoring  Suppl (ACCU-CHEK GUIDE ME) w/Device KIT 1 Piece by Does not apply route as directed. 03/30/21   Cassandria Anger, MD  cycloSPORINE (RESTASIS) 0.05 % ophthalmic emulsion Place 1 drop into both eyes 2 (two) times daily.    [provider]  divalproex (DEPAKOTE) 500 MG DR tablet Take 1 tablet (500 mg total) by mouth 2 (two) times daily. 08/03/22   [name], NP  escitalopram (LEXAPRO) 20 MG tablet Take 20 mg by mouth daily.     [provider]  ferrous sulfate 325 (65 FE) MG tablet Take 325 mg by mouth daily.    [provider]  glimepiride (AMARYL) 2 MG tablet TAKE 1 TABLET DAILY WITH BREAKFAST. 12/08/21   Cassandria Anger, MD  glucose blood (ACCU-CHEK GUIDE) test strip Monitor glucose 2 times a day. 03/30/21   Cassandria Anger, MD  hydrochlorothiazide (HYDRODIURIL) 25 MG tablet Take 1 tablet (25 mg total) by mouth daily. 03/09/21   [name], NP  isosorbide mononitrate (IMDUR) 30 MG 24 hr tablet Take 1 tablet (30 mg total) by mouth daily. 03/09/21   [name], NP  lamoTRIgine (LAMICTAL) 25 MG tablet Take 50 mg by mouth 2 (two) times daily. 05/20/19   [provider]  LANTUS SOLOSTAR 100 UNIT/ML Solostar Pen INJECT 40 UNITS INTO THE SKIN AT BEDTIME. Patient taking differently: Inject 5 Units into the skin at bedtime as needed (blood sugar above below 100; take with a snack). 02/09/21   Cassandria Anger, MD  Multiple Vitamin (MULTIVITAMIN WITH MINERALS) TABS tablet Take 1 tablet by mouth daily. 12/22/20   Bailey-Modzik, Delila A, NP  nitroGLYCERIN (NITROSTAT) 0.4 MG SL tablet Place 1 tablet (0.4 mg total) under the tongue every 5 (five) minutes as needed for chest pain. 01/15/20 10/11/21  Herminio Commons, MD  ticagrelor (BRILINTA) 90 MG TABS tablet Take 1 tablet (90 mg total) by mouth 2 (two) times daily. 03/09/21   [name], NP  Vitamin D, Ergocalciferol, (DRISDOL) 1.25 MG (50000 UNIT) CAPS capsule Take 50,000 Units by mouth every 7  (seven) days.    [provider]    Physical Exam: Vitals:   08/27/22  1033 08/27/22 1316  BP: (!) 149/79 (!) 168/89  Pulse: 65 (!) 59  Resp: 16 12  Temp:  98.2 F (36.8 C)  TempSrc:  Oral  SpO2: 100% 100%  Weight: 52.9 kg    General:  Appears calm and comfortable and is in NAD Eyes:  EOMI, normal lids, iris ENT:  grossly normal lips & tongue, mmm Neck:  no LAD, masses or thyromegaly Cardiovascular:  RRR, no m/r/g. No LE edema.  Respiratory:   CTA bilaterally with no wheezes/rales/rhonchi.  Normal respiratory effort. Abdomen:  soft, NT, ND Skin:  no rash or induration seen on limited exam Musculoskeletal:  RUE flaccidity with developing contracture and apparent discomfort with movement; also with RLE weakness  Psychiatric:  flat mood and affect, speech almost non-existent with occasional slowed responses to questions Neurologic:  apparent R hemiparesis but very difficult given his overall severe lethargy   Radiological Exams on Admission: Independently reviewed - see discussion in A/P where applicable  No results found.  EKG: not done   Labs on Admission: I have personally reviewed the available labs and imaging studies at the time of the admission.  Pertinent labs:    WBC 15.1 Hgb 9.2 - stable Platelets 135 - stable   Assessment and Plan: Principal Problem:   Acute CVA (cerebrovascular accident) ([place]) Active Problems:   DM type 2 causing vascular disease ([place])   Essential hypertension, benign   Mixed hyperlipidemia   CAD (coronary artery disease)   Subdural hematoma (HCC)   Seizure disorder (HCC)   OSA (obstructive sleep apnea)   Acute metabolic encephalopathy    Acute metabolic encephalopathy, thought to be related to acute CVA -Concerning for CVA, advanced symptoms since presentation on 1/20 at [facility] -Initial head CT without apparent acute disease on 1/20 -MRI on 1/22 with 4 mm SDH - although empyema was a consideration; also with punctate area  of restricted diffusion in the medial L temporal lobe -CT on 1/22 with stent placement noted in L ICA and L MCA and SDH confirmed, new since admission -MRI on 1/25 with slightly decreased area of restricted diffusion, now trace SDH -CTA yesterday with severe stenosis of B PCAs, L stent patent  -Previously on Brilinta and will need to resume but also had SDH - will defer to neurology -Will admit for further CVA evaluation -Telemetry monitoring -Echo -Risk stratification with FLP, A1c -Neurology consult -PT/OT/ST/Nutrition Consults -SW consult for placement -Failed bedside swallow evaluation so will be strictly NPO until seen by speech  SDH -Appears to have been spontaneous at [place] -Neurosurgery has signed off  COVID -Positive for COVID on 1/20 -This was initially thought to be the cause of his AMS -However, his sister reports that he had no other symptoms and his AMS is still ongoing despite now 7 days of reprieve -His sister requests that he be retested; if negative, can dc precautions -Regardless, there is no need for treatment at this time  HTN -Allow permissive HTN for now -Treat BP only if >220/120, and then with goal of 15% reduction -Hold amlodipine, HCTZ and plan to restart in 48-72 hours   HLD -Check FLP -Continue Lipitor 80 mg daily when able to take PO   DM -A1c was 5.0 on 1/21 -Hold glimepride -Will order moderate-scale SSI  CAD -s/p CABG =Resume Imdur when able  Bipolar/schizophrenia  -Sister denies both diagnoses -h/o polysubstance abuse, in remission x 2 years (trigger finger surgery last year -> relapse for a brief period of time)  Seizure -None since stent placement -Depakote and Lamictal are on hold for now -He was covered with IV Keppra at [facility], will continue  OSA -Does not wear CPAP, no machine at this time     Advance Care Planning:   Code Status: Full Code - Code status was discussed with the patient's sister at the time of  admission.  The patient would want to receive full resuscitative measures at this time.   Consults: Neurology; Neurosurgery by telephone only; PT/OT/ST; nutrition; TOC team  DVT Prophylaxis: SCDs  Family Communication: None present; I spoke with his sister by telephone at the time of admission  Severity of Illness: The appropriate patient status for this patient is INPATIENT. Inpatient status is judged to be reasonable and necessary in order to provide the required intensity of service to ensure the patient's safety. The patient's presenting symptoms, physical exam findings, and initial radiographic and laboratory data in the context of their chronic comorbidities is felt to place them at high risk for further clinical deterioration. Furthermore, it is not anticipated that the patient will be medically stable for discharge from the hospital within 2 midnights of admission.   * I certify that at the point of admission it is my clinical judgment that the patient will require inpatient hospital care spanning beyond 2 midnights from the point of admission due to high intensity of service, high risk for further deterioration and high frequency of surveillance required.*  Author: Karmen Bongo, MD 08/27/2022 3:00 PM  For on call review [email or web address].

## 2022-08-27 NOTE — Evaluation (Signed)
Clinical/Bedside Swallow Evaluation Patient Details  Name: Melvin Taylor MRN: [id number] Date of Birth: May 13, 1966  Today's Date: 08/27/2022 Time: SLP Start Time (ACUTE ONLY): 1500 SLP Stop Time (ACUTE ONLY): 1520 SLP Time Calculation (min) (ACUTE ONLY): 20 min  Past Medical History:  Past Medical History:  Diagnosis Date   Bipolar 1 disorder (HCC)    depression/anxiety   Bone spur    left heel   Cervical radiculopathy    Chronic back pain    Chronic chest wall pain    Chronic neck pain    Coronary artery disease    a. s/p CABG in 03/2016 with LIMA-LAD, SVG-D1, SVG-RCA, and Seq SVG-mid and distal OM   Diabetes mellitus    Type II   Hallucinations    "long history of them"   Headache(784.0)    History of gout    HTN (hypertension)    Insomnia    Neuropathy    Pain management    Polysubstance abuse ([place])    Right leg pain    chronic   Schizophrenia ([place])    Seizures ([place])    last sz between July 5-9th, 2016; epilepsy   Sleep apnea    Stroke ([place])    Transfusion of blood product refused for religious reason    Past Surgical History:  Past Surgical History:  Procedure Laterality Date   ANTERIOR CERVICAL DECOMP/DISCECTOMY FUSION N/A 12/14/2015   Procedure: ANTERIOR CERVICAL DECOMPRESSION/DISCECTOMY FUSION CERVICAL FIVE -SIX;  Surgeon: Earnie Larsson, MD;  [facility]: [facility];  Service: Neurosurgery;  Laterality: N/A;   BIOPSY  05/07/2015   Procedure: BIOPSY (Gastric);  Surgeon: [name], MD;  Location: [facility];  Service: Endoscopy;;   CARDIAC CATHETERIZATION N/A 03/07/2016   Procedure: Left Heart Cath and Coronary Angiography;  Surgeon: Belva Crome, MD;  Location: [facility];  Service: Cardiovascular;  Laterality: N/A;   COLONOSCOPY WITH PROPOFOL N/A 05/07/2015   MCN:[id number] diverticulosis, multiple colon polyps removed, tubular adenoma, serrated colon polyp. Next colonoscopy October 2019   COLONOSCOPY WITH PROPOFOL N/A 08/02/2018   Procedure: COLONOSCOPY  WITH PROPOFOL;  Surgeon: [name], MD;  Location: [facility];  Service: Endoscopy;  Laterality: N/A;  12:00pm   CORONARY ARTERY BYPASS GRAFT N/A 03/28/2016   Procedure: CORONARY ARTERY BYPASS GRAFTING (CABG) x 5 USING GREATER SAPHENOUS VEIN;  Surgeon: [name], MD;  Location: [facility];  Service: Open Heart Surgery;  Laterality: N/A;   ENDOVEIN HARVEST OF GREATER SAPHENOUS VEIN Right 03/28/2016   Procedure: ENDOVEIN HARVEST OF GREATER SAPHENOUS VEIN;  Surgeon: [name], MD;  Location: [facility];  Service: Open Heart Surgery;  Laterality: Right;   ESOPHAGEAL DILATION N/A 05/07/2015   Procedure: ESOPHAGEAL DILATION WITH 56FR MALONEY DILATOR;  Surgeon: [name], MD;  Location: [facility];  Service: Endoscopy;  Laterality: N/A;   ESOPHAGOGASTRODUODENOSCOPY (EGD) WITH PROPOFOL N/A 05/07/2015   RMR: Status post dilation of normal esophagus. Gastritis.   IR CT HEAD LTD  12/17/2020   IR CT HEAD LTD  12/17/2020   IR INTRA CRAN STENT  12/17/2020   IR PERCUTANEOUS ART THROMBECTOMY/INFUSION INTRACRANIAL INC DIAG ANGIO  12/17/2020   IR RADIOLOGIST EVAL & MGMT  03/25/2021   KNEE SURGERY Left    arthroscopy   MANDIBLE FRACTURE SURGERY     POLYPECTOMY  05/07/2015   Procedure: POLYPECTOMY (Hepatic Flexure, Distal Transverse Colon, Rectal);  Surgeon: [name], MD;  Location: [facility];  Service: Endoscopy;;   RADIOLOGY WITH ANESTHESIA  N/A 12/16/2020   Procedure: IR WITH ANESTHESIA;  Surgeon: Luanne Bras, MD;  Location: [facility];  Service: Radiology;  Laterality: N/A;   TEE WITHOUT CARDIOVERSION N/A 03/28/2016   Procedure: TRANSESOPHAGEAL ECHOCARDIOGRAM (TEE);  Surgeon: [name], MD;  Location: [facility];  Service: Open Heart Surgery;  Laterality: N/A;   TONSILLECTOMY     TRIGGER FINGER RELEASE Left 10/15/2021   Procedure: RELEASE TRIGGER FINGER/A-1 PULLEY left middle or long finger;  Surgeon: Carole Civil, MD;  Location: [facility];  Service: Orthopedics;  Laterality: Left;   HPI:  57 y.o.  male with medical history significant of bipolar d/o vs/ schizophrenia, chronic chest wall/neck/back pain, CAD s/p CABG, DM, HTN, polysubstance abuse, OSA, and seizure d/o presenting with AMS.  He was admitted to [facility] from 1/20-27 with AMS, minimally improving during hospitalization.  He was noted to have a persistent punctate focus of restricted diffusion in the L medial temporal lobe, concerning for acute/subacute CVA.  MRI/CT with SDH that developed during the hospitalization, 4 mm in L occipital region.  He has been on IV Keppra throughout the hospitalization without apparent seizure activity.  He is able to answer very limited questions, none about orientation.  He does follow some commands    Assessment / Plan / Recommendation  Clinical Impression  Pt failed yale swallow screen this admission prompting SLP clinical swallow evaluation. Per RN pt with isolated coughing with thin liquids. No prior hx of dysphagia recorded during prior admissions including past CVA (2022). Pts sister at bedside, assisted with hx. She notes pt coughes occassionally but denies pt hx of PNA. Pt did exhibit s/sx of suspected components of pharyngeal dysphagia. This included isolated coughing following thin liquids via cup and straw concerning for reduced airway protection. Pt also with some belching and wet vocal quality in addition to multiple swallows. Trialed nectar thick liquids via cup and straw. No overt coughing exhibited or s/sx of aspiration.  Orally, pt edentulous (sister states had recent dental extractions and plans to get dentures). At baseline, he consumes softer solids since dental extractions. Pt did exhibit prolonged mastication of solids and reduced bolus cohesion. Recommend dysphagia 3 (mechanical soft) and nectar thick liquids with meds whole in puree (crush larger as needed). Recommend feeding asssitance and full supervision with all POs. SLP to follow for diet tolerance and plans for future instrumental  swallow study if clinical symptoms persist.  SLP Visit Diagnosis: Dysphagia, unspecified (R13.10);Dysphagia, oral phase (R13.11)    Aspiration Risk  Moderate aspiration risk    Diet Recommendation   Dysphagia 3 (mechanical soft), nectar (mildly thick) liquids  Medication Administration: Whole meds with puree    Other  Recommendations Oral Care Recommendations: Oral care BID Other Recommendations: Order thickener from pharmacy    Recommendations for follow up therapy are one component of a multi-disciplinary discharge planning process, led by the attending physician.  Recommendations may be updated based on patient status, additional functional criteria and insurance authorization.  Follow up Recommendations  (TBD)      Assistance Recommended at Discharge    Functional Status Assessment    Frequency and Duration min 2x/week  2 weeks       Prognosis Prognosis for Safe Diet Advancement: Good Barriers to Reach Goals: Cognitive deficits;Time post onset      Swallow Study   General Date of Onset: 08/26/22 HPI: 57 y.o. male with medical history significant of bipolar d/o vs/ schizophrenia, chronic chest wall/neck/back pain, CAD s/p CABG, DM, HTN, polysubstance abuse,  OSA, and seizure d/o presenting with AMS.  He was admitted to [facility] from 1/20-27 with AMS, minimally improving during hospitalization.  He was noted to have a persistent punctate focus of restricted diffusion in the L medial temporal lobe, concerning for acute/subacute CVA.  MRI/CT with SDH that developed during the hospitalization, 4 mm in L occipital region.  He has been on IV Keppra throughout the hospitalization without apparent seizure activity.  He is able to answer very limited questions, none about orientation.  He does follow some commands Type of Study: Bedside Swallow Evaluation Previous Swallow Assessment: no hx of dysphagia, pt was followed for cognitive linguistics following CVA in 2022 Diet Prior to this  Study: Regular;Thin liquids Temperature Spikes Noted: No Respiratory Status: Room air History of Recent Intubation: No Behavior/Cognition: Alert;Requires cueing Oral Cavity Assessment: Within Functional Limits Oral Care Completed by SLP: No Oral Cavity - Dentition: Edentulous (sister states pt is to) Self-Feeding Abilities: Needs assist Patient Positioning: Upright in bed Baseline Vocal Quality: Low vocal intensity Volitional Cough: Strong Volitional Swallow: Able to elicit    Oral/Motor/Sensory Function Overall Oral Motor/Sensory Function: Generalized oral weakness   Ice Chips Ice chips: Within functional limits Presentation: Spoon   Thin Liquid Thin Liquid: Impaired Presentation: Cup;Straw Oral Phase Impairments: Reduced lingual movement/coordination Oral Phase Functional Implications: Prolonged oral transit Pharyngeal  Phase Impairments: Suspected delayed Swallow;Multiple swallows;Cough - Delayed;Cough - Immediate;Wet Vocal Quality    Nectar Thick Nectar Thick Liquid: Impaired Presentation: Cup;Straw Pharyngeal Phase Impairments: Suspected delayed Swallow;Multiple swallows   Honey Thick Honey Thick Liquid: Not tested   Puree Puree: Within functional limits   Solid     Solid: Impaired Presentation: Spoon Oral Phase Impairments: Reduced lingual movement/coordination;Impaired mastication Oral Phase Functional Implications: Prolonged oral transit;Oral residue Pharyngeal Phase Impairments: Suspected delayed Swallow;Multiple swallows      Melvin Taylor. MA, CCC-SLP Acute Rehabilitation Services   08/27/2022,3:36 PM

## 2022-08-27 NOTE — Progress Notes (Signed)
Patient ID: Melvin Taylor, male   DOB: July 20, 1966, 57 y.o.   MRN: [id number] BP (!) 149/79 (BP Location: Right Arm)   Pulse 65   Resp 16   SpO2 100%  I have reviewed the Head CT's and the MRI from 1/22 when a minuscule subdural blood collection was identified. The scans done today, and yesterday show no change. This is not a subdural empyema. Nor is this the reason for altered mental status this diminutive amount of blood.  CT angio done today confirmed the stable hematoma.

## 2022-08-28 ENCOUNTER — Inpatient Hospital Stay (HOSPITAL_COMMUNITY): Payer: Medicaid Other

## 2022-08-28 DIAGNOSIS — I6389 Other cerebral infarction: Secondary | ICD-10-CM | POA: Diagnosis not present

## 2022-08-28 DIAGNOSIS — I639 Cerebral infarction, unspecified: Secondary | ICD-10-CM | POA: Diagnosis not present

## 2022-08-28 LAB — ECHOCARDIOGRAM COMPLETE
AR max vel: 4.08 cm2
AV Area VTI: 4.7 cm2
AV Area mean vel: 3.41 cm2
AV Mean grad: 1 mmHg
AV Peak grad: 2.6 mmHg
Ao pk vel: 0.81 m/s
Area-P 1/2: 2.26 cm2
Height: 65 in
S' Lateral: 2.8 cm
Weight: 1865.97 oz

## 2022-08-28 LAB — LIPID PANEL
Cholesterol: 102 mg/dL (ref 0–200)
HDL: 42 mg/dL (ref >40)
LDL Cholesterol: 46 mg/dL (ref 0–99)
Total CHOL/HDL Ratio: 2.4 RATIO
Triglycerides: 71 mg/dL (ref <150)
VLDL: 14 mg/dL (ref 0–40)

## 2022-08-28 LAB — GLUCOSE, CAPILLARY
Glucose-Capillary: 104 mg/dL — ABNORMAL HIGH (ref 70–99)
Glucose-Capillary: 136 mg/dL — ABNORMAL HIGH (ref 70–99)
Glucose-Capillary: 167 mg/dL — ABNORMAL HIGH (ref 70–99)

## 2022-08-28 MED ORDER — TICAGRELOR 90 MG PO TABS
90.0000 mg | ORAL_TABLET | Freq: Two times a day (BID) | ORAL | Status: DC
  Administered 2022-08-28 – 2022-08-30 (×5): 90 mg via ORAL
  Filled 2022-08-28 (×6): qty 1

## 2022-08-28 NOTE — Progress Notes (Signed)
SLP Cancellation Note  Patient Details Name: TERRACE FONTANILLA MRN: [id number] DOB: 1966-01-10   Cancelled treatment:       Reason Eval/Treat Not Completed: Patient at procedure or test/unavailable (Pt currently working with PT and OT. SLP will follow up later as schedule allows.)  Ahja Martello I. Hardin Negus, [place], [place] Office number [phone]  Horton Marshall 08/28/2022, 2:13 PM

## 2022-08-28 NOTE — Progress Notes (Signed)
EEG complete - results pending 

## 2022-08-28 NOTE — Evaluation (Signed)
Physical Therapy Evaluation Patient Details Name: Melvin Taylor MRN: [id number] DOB: 1966-04-22 Today's Date: 08/28/2022  History of Present Illness  Melvin Taylor is a 57 y.o. male who was admitted to [facility] from 1/20-27 with AMS, minimally improving during hospitalization.  He was noted to have a persistent punctate focus of restricted diffusion in the L medial temporal lobe, concerning for acute/subacute CVA.  MRI/CT with SDH that developed during the hospitalization, 4 mm in L occipital region. PMH: bipolar d/o vs/ schizophrenia, chronic chest wall/neck/back pain, CAD s/p CABG, DM, HTN, polysubstance abuse, OSA, and seizure d/o   Clinical Impression  Pt admitted with above. Pt was able to ambulate without AD and completed ADLs with supervision PTA. Pt now presenting with delayed processing, flat affect, R hemiparesis, R inattention, maxA for transfers and is unable to ambulate at this time. Pt does have 53 hours of home health aide and sister reports they can provide 24/7 assist. Recommending AIR upon d/c for maximal functional recovery and to address above deficits. Acute PT to cont to follow.       Recommendations for follow up therapy are one component of a multi-disciplinary discharge planning process, led by the attending physician.  Recommendations may be updated based on patient status, additional functional criteria and insurance authorization.  Follow Up Recommendations Acute inpatient rehab (3hours/day)      Assistance Recommended at Discharge Frequent or constant Supervision/Assistance  Patient can return home with the following  A lot of help with walking and/or transfers;A lot of help with bathing/dressing/bathroom;Direct supervision/assist for medications management;Direct supervision/assist for financial management;Assist for transportation;Help with stairs or ramp for entrance;Assistance with cooking/housework    Equipment Recommendations  (TBD at next venue)   Recommendations for Other Services  Rehab consult    Functional Status Assessment Patient has had a recent decline in their functional status and demonstrates the ability to make significant improvements in function in a reasonable and predictable amount of time.     Precautions / Restrictions Precautions Precautions: Fall Precaution Comments: R hemiparesis Restrictions Weight Bearing Restrictions: No      Mobility  Bed Mobility Overal bed mobility: Needs Assistance Bed Mobility: Supine to Sit     Supine to sit: +2 for physical assistance, Max assist     General bed mobility comments: max directional verbal cues, unable to use R UE and LE to assist with transfer, maxA for trunk elevation and to bring hips to EOB    Transfers Overall transfer level: Needs assistance Equipment used: 2 person hand held assist (2 person face to face transfer with gait belt) Transfers: Sit to/from Stand, Bed to chair/wheelchair/BSC Sit to Stand: Max assist, +2 physical assistance   Step pivot transfers: Max assist, +2 physical assistance       General transfer comment: max directional verbal cues, R knee blocked, posterior hip tactile cues to promote trunk/hip extension, maxA to advance R LE but was able to initiate L LE advancement    Ambulation/Gait               General Gait Details: unable this date  Stairs            Wheelchair Mobility    Modified Rankin (Stroke Patients Only) Modified Rankin (Stroke Patients Only) Pre-Morbid Rankin Score: Moderate disability Modified Rankin: Severe disability     Balance Overall balance assessment: Needs assistance Sitting-balance support: Feet supported, Single extremity supported Sitting balance-Leahy Scale: Poor Sitting balance - Comments: pt with posterior R lateral lean  Standing balance support: Single extremity supported, During functional activity, Reliant on assistive device for balance Standing balance-Leahy  Scale: Zero Standing balance comment: dependent on external support                             Pertinent Vitals/Pain Pain Assessment Pain Assessment: Faces Faces Pain Scale: Hurts little more Pain Location: R UE with PROM into extension    Home Living Family/patient expects to be discharged to:: Private residence Living Arrangements: Alone Available Help at Discharge: Personal care attendant (7-1 Sun-sat, 8p-9pm sun-sat med management and assist pt into bed) Type of Home: Apartment Home Access: Elevator       Home Layout: One level Home Equipment: Conservation officer, nature (2 wheels);Cane - single point;Shower seat;Hand held shower head      Prior Function Prior Level of Function : Needs assist             Mobility Comments: amb in home without AD, uses cane in community, scooter to go "shopping", plays video games ADLs Comments: home health aide to assist with making meals, dressing, bathing, manages medications, sisters does finances and is his POA     Hand Dominance   Dominant Hand: Right    Extremity/Trunk Assessment   Upper Extremity Assessment Upper Extremity Assessment: Defer to OT evaluation (R UE with noted elbow flexion at rest)    Lower Extremity Assessment Lower Extremity Assessment: RLE deficits/detail RLE Deficits / Details: increased tone, grossly 2+/5 RLE Coordination: decreased gross motor    Cervical / Trunk Assessment Cervical / Trunk Assessment: Normal  Communication   Communication: Expressive difficulties  Cognition Arousal/Alertness: Lethargic Behavior During Therapy: Flat affect Overall Cognitive Status: Impaired/Different from baseline Area of Impairment: Orientation, Attention, Memory, Following commands, Safety/judgement, Awareness, Problem solving                 Orientation Level: Disoriented to, Place, Time, Situation Current Attention Level: Focused Memory: Decreased short-term memory Following Commands: Follows one  step commands with increased time Safety/Judgement: Decreased awareness of safety, Decreased awareness of deficits (R inattention) Awareness: Intellectual Problem Solving: Slow processing, Difficulty sequencing, Requires verbal cues, Requires tactile cues General Comments: per sister, [name], pt normally able to hold a conversation, pt now with delayed response time and one word answers        General Comments General comments (skin integrity, edema, etc.): VSS    Exercises     Assessment/Plan    PT Assessment Patient needs continued PT services  PT Problem List Decreased strength;Decreased activity tolerance;Decreased balance;Decreased mobility;Decreased coordination;Decreased cognition;Decreased range of motion;Decreased safety awareness;Impaired tone       PT Treatment Interventions DME instruction;Gait training;Stair training;Functional mobility training;Therapeutic activities;Therapeutic exercise;Balance training;Neuromuscular re-education;Cognitive remediation    PT Goals (Current goals can be found in the Care Plan section)  Acute Rehab PT Goals Patient Stated Goal: didn't state PT Goal Formulation: With patient/family Time For Goal Achievement: 09/11/22 Potential to Achieve Goals: Good    Frequency Min 4X/week     Co-evaluation               AM-PAC PT "6 Clicks" Mobility  Outcome Measure Help needed turning from your back to your side while in a flat bed without using bedrails?: A Lot Help needed moving from lying on your back to sitting on the side of a flat bed without using bedrails?: A Lot Help needed moving to and from a bed to a chair (including a wheelchair)?:  A Lot Help needed standing up from a chair using your arms (e.g., wheelchair or bedside chair)?: A Lot Help needed to walk in hospital room?: Total Help needed climbing 3-5 steps with a railing? : Total 6 Click Score: 10    End of Session Equipment Utilized During Treatment: Gait belt Activity  Tolerance: Patient tolerated treatment well Patient left: in chair;with chair alarm set;with call bell/phone within reach;with family/visitor present Nurse Communication: Mobility status (how to transfer pt with 2 person std pvt) PT Visit Diagnosis: Difficulty in walking, not elsewhere classified (R26.2);Hemiplegia and hemiparesis Hemiplegia - Right/Left: Right Hemiplegia - dominant/non-dominant: Dominant Hemiplegia - caused by: Cerebral infarction    Time: 0034-9179 PT Time Calculation (min) (ACUTE ONLY): 34 min   Charges:   PT Evaluation $PT Eval Moderate Complexity: 1 Mod          Kittie Plater, PT, DPT Acute Rehabilitation Services Secure chat preferred Office #: [phone]   Berline Lopes 08/28/2022, 3:38 PM

## 2022-08-28 NOTE — Progress Notes (Signed)
Inpatient Rehab Admissions Coordinator Note:   Per therapy patient was screened for CIR candidacy by Shiv Shuey Danford Bad, CCC-SLP. At this time, pt appears to be a potential candidate for CIR. I will place an order for rehab consult for full assessment, per our protocol.  Please contact me any with questions.Gayland Curry, [place], [place] Admissions Coordinator [phone] 08/28/22 4:43 PM

## 2022-08-28 NOTE — Progress Notes (Signed)
Arrived to the room to adjust PICC line 2 cm but pt is currently undergoing EEG and is in recliner.  Plan to return.  Primary RN aware.

## 2022-08-28 NOTE — Progress Notes (Signed)
Participant going to CT, RN to call when pt is available. Call back number [phone]

## 2022-08-28 NOTE — Progress Notes (Signed)
Awaiting PICC nurse to pull back catheter.

## 2022-08-28 NOTE — Progress Notes (Signed)
Orthopedic Tech Progress Note Patient Details:  Melvin Taylor Sep 10, 1965 [id number]  Patient ID: Melvin Taylor, male   DOB: 10-Apr-1966, 57 y.o.   MRN: [id number] Soft elbow splint completed by OT, as the patient needed a removable kind.  Vernona Rieger 08/28/2022, 4:41 PM

## 2022-08-28 NOTE — Evaluation (Signed)
Occupational Therapy Evaluation Patient Details Name: Melvin Taylor MRN: [id number] DOB: 1965-09-14 Today's Date: 08/28/2022   History of Present Illness Melvin Taylor is a 57 y.o. male who was admitted to [facility] from 1/20-27 with AMS, minimally improving during hospitalization.  He was noted to have a persistent punctate focus of restricted diffusion in the L medial temporal lobe, concerning for acute/subacute CVA.  MRI/CT with SDH that developed during the hospitalization, 4 mm in L occipital region. PMH: bipolar d/o vs/ schizophrenia, chronic chest wall/neck/back pain, CAD s/p CABG, DM, HTN, polysubstance abuse, OSA, and seizure d/o   Clinical Impression   Melvin Taylor was evaluated s/p the above admission list, he live alone and has a PCA taht comes 40 hours/wk to assist with ADLs (pt able to stay with a family member for 24/7 assist if needed). Per sister's report, he typically is mod I with mobility. Upon evaluation he was limited by R hemibody sensory motor deficits, contractures, impaired balance, impaired communication and cognition, weakness and poor activity tolerance. Overall he required max A +2 fro bed mobility, to stand and to pivot to the chair. Due to the deficits listed below he requires mod-max A +2 for all ADLs. Soft elbow splint applied for joint integrity, RN notified to remove ever ~1 hour for skin check and not to sleep in splint tonight; remove if any skin integrity issues are noted. OT to continue to follow acutely. Recommend d/c to AIR for maximal functional progress towards baseline.      Recommendations for follow up therapy are one component of a multi-disciplinary discharge planning process, led by the attending physician.  Recommendations may be updated based on patient status, additional functional criteria and insurance authorization.   Follow Up Recommendations  Acute inpatient rehab (3hours/day)     Assistance Recommended at Discharge Frequent or constant  Supervision/Assistance  Patient can return home with the following A lot of help with walking and/or transfers;Two people to help with walking and/or transfers;A lot of help with bathing/dressing/bathroom;Two people to help with bathing/dressing/bathroom;Assistance with cooking/housework;Assistance with feeding;Direct supervision/assist for medications management;Direct supervision/assist for financial management;Assist for transportation;Help with stairs or ramp for entrance    Functional Status Assessment  Patient has had a recent decline in their functional status and demonstrates the ability to make significant improvements in function in a reasonable and predictable amount of time.  Equipment Recommendations  Other (comment) (defer)    Recommendations for Other Services Rehab consult     Precautions / Restrictions Precautions Precautions: Fall Precaution Comments: R hemiparesis, elbow splint Restrictions Weight Bearing Restrictions: No      Mobility Bed Mobility Overal bed mobility: Needs Assistance Bed Mobility: Supine to Sit     Supine to sit: +2 for physical assistance, Max assist     General bed mobility comments: max directional verbal cues, unable to use R UE and LE to assist with transfer, maxA for trunk elevation and to bring hips to EOB    Transfers Overall transfer level: Needs assistance Equipment used: 2 person hand held assist (2 person face to face transfer with gait belt) Transfers: Sit to/from Stand, Bed to chair/wheelchair/BSC Sit to Stand: Max assist, +2 physical assistance     Step pivot transfers: Max assist, +2 physical assistance     General transfer comment: max directional verbal cues, R knee blocked, posterior hip tactile cues to promote trunk/hip extension, maxA to advance R LE but was able to initiate L LE advancement      Balance Overall  balance assessment: Needs assistance Sitting-balance support: Feet supported, Single extremity  supported Sitting balance-Leahy Scale: Poor Sitting balance - Comments: pt with posterior R lateral lean   Standing balance support: Single extremity supported, During functional activity, Reliant on assistive device for balance Standing balance-Leahy Scale: Zero Standing balance comment: dependent on external support                           ADL either performed or assessed with clinical judgement   ADL Overall ADL's : Needs assistance/impaired Eating/Feeding: Moderate assistance;Sitting   Grooming: Moderate assistance;Sitting   Upper Body Bathing: Maximal assistance;Sitting   Lower Body Bathing: Maximal assistance;+2 for safety/equipment;+2 for physical assistance;Sit to/from stand   Upper Body Dressing : Maximal assistance;Sitting   Lower Body Dressing: Maximal assistance;+2 for safety/equipment;+2 for physical assistance;Sit to/from stand   Toilet Transfer: +2 for physical assistance;+2 for safety/equipment;Maximal assistance   Toileting- Clothing Manipulation and Hygiene: Maximal assistance;+2 for physical assistance;+2 for safety/equipment;Sit to/from stand       Functional mobility during ADLs: +2 for physical assistance;+2 for safety/equipment;Maximal assistance General ADL Comments: limited by L hemibody impairments, cog, communication     Vision Baseline Vision/History: 0 No visual deficits Vision Assessment?: No apparent visual deficits Additional Comments: did not formally assess     Perception Perception Perception Tested?: No   Praxis Praxis Praxis tested?: Not tested    Pertinent Vitals/Pain Pain Assessment Faces Pain Scale: Hurts little more Pain Location: R UE with PROM into extension     Hand Dominance Right   Extremity/Trunk Assessment Upper Extremity Assessment Upper Extremity Assessment: RUE deficits/detail RUE Deficits / Details: contracted, minimal PORM of elbow and shoulder. wrist and hand with full PROM. minimal activity  movement. grimiacing to touch RUE Sensation: decreased light touch;decreased proprioception RUE Coordination: decreased fine motor;decreased gross motor   Lower Extremity Assessment Lower Extremity Assessment: Defer to PT evaluation RLE Deficits / Details: increased tone, grossly 2+/5 RLE Coordination: decreased gross motor   Cervical / Trunk Assessment Cervical / Trunk Assessment: Normal   Communication Communication Communication: Expressive difficulties   Cognition Arousal/Alertness: Lethargic Behavior During Therapy: Flat affect Overall Cognitive Status: Impaired/Different from baseline Area of Impairment: Orientation, Attention, Memory, Following commands, Safety/judgement, Awareness, Problem solving                 Orientation Level: Disoriented to, Place, Time, Situation Current Attention Level: Focused Memory: Decreased short-term memory Following Commands: Follows one step commands with increased time Safety/Judgement: Decreased awareness of safety, Decreased awareness of deficits (R inattention) Awareness: Intellectual Problem Solving: Slow processing, Difficulty sequencing, Requires verbal cues, Requires tactile cues General Comments: per sister, [name], pt normally able to hold a conversation, pt now with delayed response time and one word answers     General Comments  VSS, sister present and supportive    Exercises     Shoulder Instructions      Home Living Family/patient expects to be discharged to:: Private residence Living Arrangements: Alone Available Help at Discharge: Personal care attendant (7-1 Sun-sat, 8p-9pm sun-sat med management and assist pt into bed) Type of Home: Apartment Home Access: Elevator     Home Layout: One level     Bathroom Shower/Tub: Teacher, early years/pre: Standard Bathroom Accessibility: Yes   Home Equipment: Conservation officer, nature (2 wheels);Cane - single point;Shower seat;Hand held shower head   Additional  Comments: info provided by sister      Prior Functioning/Environment Prior Level of Function :  Needs assist             Mobility Comments: amb in home without AD, uses cane in community, scooter to go "shopping", plays video games ADLs Comments: home health aide to assist with making meals, dressing, bathing, manages medications, sisters does finances and is his POA        OT Problem List: Decreased strength;Decreased range of motion;Decreased activity tolerance;Impaired balance (sitting and/or standing);Decreased cognition;Decreased safety awareness;Decreased knowledge of use of DME or AE;Decreased knowledge of precautions;Impaired UE functional use;Pain      OT Treatment/Interventions: Self-care/ADL training;Therapeutic exercise;DME and/or AE instruction;Therapeutic activities;Patient/family education;Balance training    OT Goals(Current goals can be found in the care plan section) Acute Rehab OT Goals Patient Stated Goal: unable to state OT Goal Formulation: With patient Time For Goal Achievement: 09/11/22 Potential to Achieve Goals: Good ADL Goals Pt Will Perform Grooming: with min assist;sitting Pt Will Perform Upper Body Dressing: with min assist;sitting Pt Will Perform Lower Body Dressing: with mod assist;sit to/from stand Pt Will Transfer to Toilet: with mod assist;stand pivot transfer;bedside commode  OT Frequency: Min 2X/week    Co-evaluation PT/OT/SLP Co-Evaluation/Treatment: Yes Reason for Co-Treatment: Complexity of the patient's impairments (multi-system involvement);For patient/therapist safety;To address functional/ADL transfers          AM-PAC OT "6 Clicks" Daily Activity     Outcome Measure Help from another person eating meals?: A Little Help from another person taking care of personal grooming?: A Lot Help from another person toileting, which includes using toliet, bedpan, or urinal?: A Lot Help from another person bathing (including washing, rinsing,  drying)?: A Lot Help from another person to put on and taking off regular upper body clothing?: A Lot Help from another person to put on and taking off regular lower body clothing?: A Lot 6 Click Score: 13   End of Session Equipment Utilized During Treatment: Gait belt Nurse Communication: Mobility status  Activity Tolerance: Patient tolerated treatment well Patient left: in chair;with call bell/phone within reach  OT Visit Diagnosis: Unsteadiness on feet (R26.81);Other abnormalities of gait and mobility (R26.89);Muscle weakness (generalized) (M62.81);Hemiplegia and hemiparesis Hemiplegia - Right/Left: Right                Time: 8921-1941 OT Time Calculation (min): 38 min Charges:  OT General Charges $OT Visit: 1 Visit OT Evaluation $OT Eval Moderate Complexity: 1 Mod    Brayn Eckstein D Causey 08/28/2022, 4:24 PM

## 2022-08-28 NOTE — Progress Notes (Signed)
Mobility Specialist Progress Note   08/28/22 1610  Mobility  Activity Transferred from chair to bed  Level of Assistance +2 (takes two people) (Mod A)  Assistive Device Other (Comment) (HHA)  Range of Motion/Exercises Active;All extremities  Activity Response Tolerated well   Patient received in recliner, RN requesting assistance to get patient back in bed. Required mod A +2 and HHA to stand/pivot to bed. Patient then required total A for bed mobility. Tolerated without complaint or incident. Was left in supine with all needs met, call bell in reach and RN present.   [place] Aydien Majette, BS EXP Mobility Specialist Please contact via SecureChat or Rehab office at [phone]

## 2022-08-28 NOTE — Progress Notes (Addendum)
PROGRESS NOTE    Melvin Taylor  UXN:[id number] DOB: October 31, 1965 DOA: 08/27/2022 PCP: [name], FNP   Brief Narrative:  Melvin Taylor is a 57 y.o. male with medical history significant of bipolar d/o vs/ schizophrenia, chronic chest wall/neck/back pain, CAD s/p CABG, DM, HTN, prior history of polysubstance abuse, OSA, and seizure d/o presenting with AMS.  He was admitted to [facility] from 1/20-27 with newly diagnosed covid(completed treatment) and AMS, minimally improving during hospitalization.  Punctate CVA on imaging at that facility with subdural hematoma developing during that hospitalization, 4 mm in the left occipital region.  He continues on Keppra for seizure prophylaxis.  At baseline he is able to answer direct questions per family.  Otherwise lives alone with caretakers.  Given new findings of subdural hematoma he was transferred to our facility for neurosurgery evaluation on 08/27/2022.  Assessment & Plan:   Principal Problem:   Acute CVA (cerebrovascular accident) ([place]) Active Problems:   DM type 2 causing vascular disease ([place])   Essential hypertension, benign   Mixed hyperlipidemia   CAD (coronary artery disease)   Subdural hematoma (HCC)   Seizure disorder (HCC)   OSA (obstructive sleep apnea)   Acute metabolic encephalopathy  Acute metabolic encephalopathy, multifactorial, resolved - Recent CVA, SDH, recent covid infection and high dose steroids with hospitalization all likely contributing to mental status changes - Neurosurgery signed off given small size and stable subdural hematoma - Neurology following, repeat CT head today without contrast to evaluate for evolution of subdural hematoma, if stable can resume Brilinta **ADDENDUM - repeat CT head stable, Neuro to resume Brilinta as previously discussed. - Questionable seizures per previous discussion with neurology, defer to their expertise for further testing or EEG, patient continues on Keppra without overt  seizures at this facility or prior. -Patient otherwise appears to be back to baseline today able to answer simple direct questions which was his baseline per signout.   SDH -Appears to have been spontaneous at [place] -Neurosurgery has signed off -If stable on repeat CT today can resume Brilinta   COVID, questionably incidental finding, resolved -Positive for COVID on 1/20, asymptomatic other than mental status changes which are likely due to other confounding issues as above. -Completed antiviral/steroid course without improvement in symptoms -There is no indication to retest as covid test will remain positive well past acute infection phase (upwards of 90 days in some patients) -He would have completed quarantine 08/25/22 given his lack of symptoms/admitted for reasons other than covid infection -This was initially thought to be the cause of his AMS but is less likely given above   HTN -Permissive hypertension window per neurology -Treat BP only if >220/120, and then with goal of 15% reduction -Hold amlodipine, HCTZ and plan to restart in 48-72 hours   HLD -Check FLP - recently checked earlier this month - repeat essentially the same/unremarkable -Continue Lipitor 80 mg daily when able to take PO   DM -A1c was 5.0 on 1/21 -Hold glimepride -Will order moderate-scale SSI   CAD, history -s/p CABG -Hold hypertensive medications currently normotensive   Bipolar/schizophrenia  -Sister denies both diagnoses -h/o polysubstance abuse, in remission x 2 years (trigger finger surgery last year -> relapse for a brief period of time)   History of seizures -None since stent placement -Depakote and Lamictal are on hold for now -Depakote level within normal limits -Currently on IV Keppra, transition per neurology as above   OSA -Does not wear CPAP, no machine at this time  DVT prophylaxis: None given subdural hematoma as above Code Status: Full Family Communication: None  present  Status is: Inpatient  Dispo: The patient is from: Home              Anticipated d/c is to: Home              Anticipated d/c date is: 24 to 48 hours pending EEG and possible antiepileptic medication titration              Patient currently not medically stable for discharge  Consultants:  Neurosurgery, neurology  Procedures:  None  Antimicrobials:  None indicated, completed antiviral course at previous facility for COVID infection as above  Subjective: No acute issues or events overnight, appears to be back to baseline mental status and interview this morning, review of systems negative other than back pain which she states is chronic.  Objective: Vitals:   08/27/22 1904 08/27/22 2304 08/28/22 0712 08/28/22 1102  BP: (!) 154/85 116/71 (!) 156/79 135/84  Pulse:   60 64  Resp: '11 12 12 10  '$ Temp: 98.4 F (36.9 C) 98.8 F (37.1 C) 98.7 F (37.1 C) 98.6 F (37 C)  TempSrc: Oral Oral Oral Oral  SpO2: 98% 100% 100% 100%  Weight:      Height:        Intake/Output Summary (Last 24 hours) at 08/28/2022 1402 Last data filed at 08/28/2022 0858 Gross per 24 hour  Intake 829.64 ml  Output 1350 ml  Net -520.36 ml   Filed Weights   08/27/22 1033 08/27/22 1620  Weight: 52.9 kg 52.9 kg    Examination:  General:  Pleasantly resting in bed, No acute distress.  Answer simple questions with 1-2 word answers HEENT:  Normocephalic atraumatic.  Sclerae nonicteric, noninjected.  Extraocular movements intact bilaterally. Neck:  Without mass or deformity.  Trachea is midline. Lungs:  Clear to auscultate bilaterally without rhonchi, wheeze, or rales. Heart:  Regular rate and rhythm.  Without murmurs, rubs, or gallops. Abdomen:  Soft, nontender, nondistended.  Without guarding or rebound. Extremities: Without cyanosis, clubbing, edema, or obvious deformity.  Data Reviewed: I have personally reviewed following labs and imaging studies  CBC: No results for input(s): "WBC",  "NEUTROABS", "HGB", "HCT", "MCV", "PLT" in the last 168 hours. Basic Metabolic Panel: No results for input(s): "NA", "K", "CL", "CO2", "GLUCOSE", "BUN", "CREATININE", "CALCIUM", "MG", "PHOS" in the last 168 hours. GFR: CrCl cannot be calculated (Patient's most recent lab result is older than the maximum 21 days allowed.). Liver Function Tests: No results for input(s): "AST", "ALT", "ALKPHOS", "BILITOT", "PROT", "ALBUMIN" in the last 168 hours. No results for input(s): "LIPASE", "AMYLASE" in the last 168 hours. No results for input(s): "AMMONIA" in the last 168 hours. Coagulation Profile: No results for input(s): "INR", "PROTIME" in the last 168 hours. Cardiac Enzymes: No results for input(s): "CKTOTAL", "CKMB", "CKMBINDEX", "TROPONINI" in the last 168 hours. BNP (last 3 results) No results for input(s): "PROBNP" in the last 8760 hours. HbA1C: No results for input(s): "HGBA1C" in the last 72 hours. CBG: Recent Labs  Lab 08/27/22 1042 08/27/22 1613 08/27/22 2033 08/28/22 0005  GLUCAP 227* 257* 230* 136*   Lipid Profile: Recent Labs    08/28/22 0329  CHOL 102  HDL 42  LDLCALC 46  TRIG 71  CHOLHDL 2.4   Thyroid Function Tests: No results for input(s): "TSH", "T4TOTAL", "FREET4", "T3FREE", "THYROIDAB" in the last 72 hours. Anemia Panel: No results for input(s): "VITAMINB12", "FOLATE", "FERRITIN", "TIBC", "IRON", "RETICCTPCT" in  the last 72 hours. Sepsis Labs: No results for input(s): "PROCALCITON", "LATICACIDVEN" in the last 168 hours.  Recent Results (from the past 240 hour(s))  Resp panel by RT-PCR (RSV, Flu A&B, Covid) Anterior Nasal Swab     Status: Abnormal   Collection Time: 08/27/22  3:03 PM   Specimen: Anterior Nasal Swab  Result Value Ref Range Status   SARS Coronavirus 2 by RT PCR POSITIVE (A) NEGATIVE Final    Comment: (NOTE) SARS-CoV-2 target nucleic acids are DETECTED.  The SARS-CoV-2 RNA is generally detectable in upper respiratory specimens during the  acute phase of infection. Positive results are indicative of the presence of the identified virus, but do not rule out bacterial infection or co-infection with other pathogens not detected by the test. Clinical correlation with patient history and other diagnostic information is necessary to determine patient infection status. The expected result is Negative.  Fact Sheet for Patients: EntrepreneurPulse.com.au  Fact Sheet for Healthcare Providers: IncredibleEmployment.be  This test is not yet approved or cleared by the [place] FDA and  has been authorized for detection and/or diagnosis of SARS-CoV-2 by FDA under an Emergency Use Authorization (EUA).  This EUA will remain in effect (meaning this test can be used) for the duration of  the COVID-19 declaration under Section 564(b)(1) of the A ct, 21 U.S.C. section 360bbb-3(b)(1), unless the authorization is terminated or revoked sooner.     Influenza A by PCR NEGATIVE NEGATIVE Final   Influenza B by PCR NEGATIVE NEGATIVE Final    Comment: (NOTE) The Xpert Xpress SARS-CoV-2/FLU/RSV plus assay is intended as an aid in the diagnosis of influenza from Nasopharyngeal swab specimens and should not be used as a sole basis for treatment. Nasal washings and aspirates are unacceptable for Xpert Xpress SARS-CoV-2/FLU/RSV testing.  Fact Sheet for Patients: EntrepreneurPulse.com.au  Fact Sheet for Healthcare Providers: IncredibleEmployment.be  This test is not yet approved or cleared by the [place] FDA and has been authorized for detection and/or diagnosis of SARS-CoV-2 by FDA under an Emergency Use Authorization (EUA). This EUA will remain in effect (meaning this test can be used) for the duration of the COVID-19 declaration under Section 564(b)(1) of the Act, 21 U.S.C. section 360bbb-3(b)(1), unless the authorization is terminated or revoked.     Resp  Syncytial Virus by PCR NEGATIVE NEGATIVE Final    Comment: (NOTE) Fact Sheet for Patients: EntrepreneurPulse.com.au  Fact Sheet for Healthcare Providers: IncredibleEmployment.be  This test is not yet approved or cleared by the [place] FDA and has been authorized for detection and/or diagnosis of SARS-CoV-2 by FDA under an Emergency Use Authorization (EUA). This EUA will remain in effect (meaning this test can be used) for the duration of the COVID-19 declaration under Section 564(b)(1) of the Act, 21 U.S.C. section 360bbb-3(b)(1), unless the authorization is terminated or revoked.  Performed at [facility], [place] [address], [place], [place] [address]          Radiology Studies: ECHOCARDIOGRAM COMPLETE  Result Date: 08/28/2022    ECHOCARDIOGRAM REPORT   Patient Name:   Melvin Taylor Date of Exam: 08/28/2022 Medical Rec #:  [id number]         Height:       65.0 in Accession #:    [id number]        Weight:       116.6 lb Date of Birth:  [birth year]          BSA:  1.573 m Patient Age:    71 years          BP:           130/84 mmHg Patient Gender: M                 HR:           56 bpm. Exam Location:  Inpatient Procedure: 2D Echo, Cardiac Doppler and Color Doppler Indications:    I63.9 Stroke  History:        Patient has prior history of Echocardiogram examinations, most                 recent 12/17/2020. Stroke; Risk Factors:Sleep Apnea, Diabetes,                 Dyslipidemia and Former Smoker.  Sonographer:    Wilkie Aye RVT RCS Referring Phys: [id number] JENNIFER YATES  Sonographer Comments: Image acquisition challenging due to uncooperative patient and Patient unable to turn; images off axis; patient kept pushing Techs hand away, exam ended. IMPRESSIONS  1. Left ventricular ejection fraction, by estimation, is 60 to 65%. The left ventricle has normal function. The left ventricle has no regional wall motion abnormalities. There is mild left  ventricular hypertrophy. Left ventricular diastolic parameters are consistent with Grade I diastolic dysfunction (impaired relaxation).  2. Right ventricular systolic function is normal. The right ventricular size is normal.  3. The mitral valve is normal in structure. Mild mitral valve regurgitation. No evidence of mitral stenosis.  4. The aortic valve is tricuspid. Aortic valve regurgitation is not visualized. Aortic valve sclerosis is present, with no evidence of aortic valve stenosis.  5. The inferior vena cava is normal in size with greater than 50% respiratory variability, suggesting right atrial pressure of 3 mmHg. Comparison(s): EF 65-70%. FINDINGS  Left Ventricle: Left ventricular ejection fraction, by estimation, is 60 to 65%. The left ventricle has normal function. The left ventricle has no regional wall motion abnormalities. The left ventricular internal cavity size was normal in size. There is  mild left ventricular hypertrophy. Left ventricular diastolic parameters are consistent with Grade I diastolic dysfunction (impaired relaxation). Right Ventricle: The right ventricular size is normal. Right ventricular systolic function is normal. Left Atrium: Left atrial size was normal in size. Right Atrium: Right atrial size was normal in size. Pericardium: There is no evidence of pericardial effusion. Mitral Valve: The mitral valve is normal in structure. Mild mitral valve regurgitation. No evidence of mitral valve stenosis. Tricuspid Valve: The tricuspid valve is normal in structure. Tricuspid valve regurgitation is mild . No evidence of tricuspid stenosis. Aortic Valve: The aortic valve is tricuspid. Aortic valve regurgitation is not visualized. Aortic valve sclerosis is present, with no evidence of aortic valve stenosis. Aortic valve mean gradient measures 1.0 mmHg. Aortic valve peak gradient measures 2.6  mmHg. Aortic valve area, by VTI measures 4.70 cm. Pulmonic Valve: The pulmonic valve was normal in  structure. Pulmonic valve regurgitation is trivial. No evidence of pulmonic stenosis. Aorta: The aortic root is normal in size and structure. Venous: The inferior vena cava is normal in size with greater than 50% respiratory variability, suggesting right atrial pressure of 3 mmHg. IAS/Shunts: The interatrial septum is aneurysmal. No atrial level shunt detected by color flow Doppler.  LEFT VENTRICLE PLAX 2D LVIDd:         3.70 cm   Diastology LVIDs:         2.80 cm   LV e' medial:  6.76 cm/s LV PW:         1.35 cm   LV E/e' medial: 9.0 LV IVS:        1.15 cm LVOT diam:     2.30 cm LV SV:         64 LV SV Index:   41 LVOT Area:     4.15 cm  RIGHT VENTRICLE RV S prime:     9.41 cm/s LEFT ATRIUM         Index LA diam:    2.90 cm 1.84 cm/m  AORTIC VALVE                    PULMONIC VALVE AV Area (Vmax):    4.08 cm     PV Vmax:          0.75 m/s AV Area (Vmean):   3.41 cm     PV Peak grad:     2.2 mmHg AV Area (VTI):     4.70 cm     PR End Diast Vel: 4.33 msec AV Vmax:           80.50 cm/s AV Vmean:          55.100 cm/s AV VTI:            0.137 m AV Peak Grad:      2.6 mmHg AV Mean Grad:      1.0 mmHg LVOT Vmax:         79.00 cm/s LVOT Vmean:        45.200 cm/s LVOT VTI:          0.155 m LVOT/AV VTI ratio: 1.13  AORTA Ao Root diam: 3.70 cm MITRAL VALVE               TRICUSPID VALVE MV Area (PHT): 2.26 cm    TR Peak grad:   26.8 mmHg MV Decel Time: 336 msec    TR Vmax:        259.00 cm/s MV E velocity: 60.60 cm/s MV A velocity: 87.30 cm/s  SHUNTS MV E/A ratio:  0.69        Systemic VTI:  0.16 m                            Systemic Diam: 2.30 cm Kirk Ruths MD Electronically signed by Kirk Ruths MD Signature Date/Time: 08/28/2022/12:15:17 PM    Final    DG CHEST PORT 1 VIEW  Result Date: 08/27/2022 CLINICAL DATA:  PICC line placement EXAM: PORTABLE CHEST 1 VIEW COMPARISON:  Chest x-ray August 20, 2022 FINDINGS: A new left PICC line is identified with the distal tip near the caval atrial junction. The tip may  be just beyond the caval atrial junction the patient may benefit from pulling the PICC line back 2 cm. The heart, hila, and mediastinum are unremarkable. No pneumothorax. No nodules, masses, or focal infiltrates. IMPRESSION: The new left PICC line is in good position. The tip may be just beyond the caval atrial junction. The patient may benefit from pulling the PICC line back 2 cm. These results will be called to the ordering clinician or representative by the Radiologist Assistant, and communication documented in the PACS or [employer]. Electronically Signed   By: Dorise Bullion III M.D.   On: 08/27/2022 16:04    Scheduled Meds:  atorvastatin  80 mg Oral QHS   Chlorhexidine Gluconate Cloth  6 each Topical Daily  cycloSPORINE  1 drop Both Eyes BID   escitalopram  20 mg Oral Daily   insulin aspart  0-15 Units Subcutaneous TID WC   insulin aspart  0-5 Units Subcutaneous QHS   isosorbide mononitrate  30 mg Oral Daily   sodium chloride flush  10-40 mL Intracatheter Q12H   Continuous Infusions:  sodium chloride 50 mL/hr at 08/28/22 0204   levETIRAcetam 1,000 mg (08/28/22 0415)     LOS: 1 day   Time spent: 57mn  Aryav C Mayzie Caughlin, DO [facility]  If 7PM-7AM, please contact night-coverage [email or web address]  08/28/2022, 2:02 PM

## 2022-08-29 DIAGNOSIS — R4182 Altered mental status, unspecified: Secondary | ICD-10-CM

## 2022-08-29 DIAGNOSIS — I639 Cerebral infarction, unspecified: Secondary | ICD-10-CM | POA: Diagnosis not present

## 2022-08-29 LAB — GLUCOSE, CAPILLARY
Glucose-Capillary: 102 mg/dL — ABNORMAL HIGH (ref 70–99)
Glucose-Capillary: 109 mg/dL — ABNORMAL HIGH (ref 70–99)
Glucose-Capillary: 230 mg/dL — ABNORMAL HIGH (ref 70–99)
Glucose-Capillary: 264 mg/dL — ABNORMAL HIGH (ref 70–99)

## 2022-08-29 LAB — CBC
HCT: 30.2 % — ABNORMAL LOW (ref 39.0–52.0)
Hemoglobin: 10.6 g/dL — ABNORMAL LOW (ref 13.0–17.0)
MCH: 31.4 pg (ref 26.0–34.0)
MCHC: 35.1 g/dL (ref 30.0–36.0)
MCV: 89.3 fL (ref 80.0–100.0)
Platelets: 165 10*3/uL (ref 150–400)
RBC: 3.38 MIL/uL — ABNORMAL LOW (ref 4.22–5.81)
RDW: 15.4 % (ref 11.5–15.5)
WBC: 6.7 10*3/uL (ref 4.0–10.5)
nRBC: 0.3 % — ABNORMAL HIGH (ref 0.0–0.2)

## 2022-08-29 LAB — BASIC METABOLIC PANEL
Anion gap: 9 (ref 5–15)
BUN: 18 mg/dL (ref 6–20)
CO2: 25 mmol/L (ref 22–32)
Calcium: 8.4 mg/dL — ABNORMAL LOW (ref 8.9–10.3)
Chloride: 110 mmol/L (ref 98–111)
Creatinine, Ser: 0.98 mg/dL (ref 0.61–1.24)
GFR, Estimated: >60 mL/min (ref >60)
Glucose, Bld: 86 mg/dL (ref 70–99)
Potassium: 3.3 mmol/L — ABNORMAL LOW (ref 3.5–5.1)
Sodium: 144 mmol/L (ref 135–145)

## 2022-08-29 LAB — LAMOTRIGINE LEVEL: Lamotrigine Lvl: <1 ug/mL — ABNORMAL LOW (ref 2.0–20.0)

## 2022-08-29 MED ORDER — ADULT MULTIVITAMIN W/MINERALS CH
1.0000 | ORAL_TABLET | Freq: Every day | ORAL | Status: DC
  Administered 2022-08-29 – 2022-08-30 (×2): 1 via ORAL
  Filled 2022-08-29 (×3): qty 1

## 2022-08-29 MED ORDER — NEPRO/CARBSTEADY PO LIQD
237.0000 mL | Freq: Three times a day (TID) | ORAL | Status: DC
  Administered 2022-08-30 (×3): 237 mL via ORAL

## 2022-08-29 NOTE — Progress Notes (Signed)
  Transition of Care [facility]) Screening Note   Patient Details  Name: Melvin Taylor Date of Birth: 09-12-1965   Transition of Care [facility]) CM/SW Contact:    Geralynn Ochs, LCSW Phone Number: 08/29/2022, 3:16 PM    Transition of Care Department [facility]) has reviewed patient, medical workup ongoing. Noting recommendation for AIR but unsure if family will have 24/7 assist. We will continue to monitor patient advancement through interdisciplinary progression rounds. If new patient transition needs arise, please place a TOC consult.

## 2022-08-29 NOTE — Progress Notes (Signed)
PROGRESS NOTE    Melvin Taylor  MGN:[id number] DOB: September 23, 1965 DOA: 08/27/2022 PCP: [name], FNP   Brief Narrative:  Melvin Taylor is a 57 y.o. male with medical history significant of bipolar d/o vs/ schizophrenia, chronic chest wall/neck/back pain, CAD s/p CABG, DM, HTN, prior history of polysubstance abuse, OSA, and seizure d/o presenting with AMS.  He was admitted to [facility] from 1/20-27 with newly diagnosed covid(completed treatment) and AMS, minimally improving during hospitalization.  Punctate CVA on imaging at that facility with subdural hematoma developing during that hospitalization, 4 mm in the left occipital region.  He continues on Keppra for seizure prophylaxis.  At baseline he is able to answer direct questions per family.  Otherwise lives alone with caretakers.  Given new findings of subdural hematoma he was transferred to our facility for neurosurgery evaluation on 08/27/2022.  Patient medically stable for discharge - awaiting safe disposition. CIR is evaluating for admission.  Assessment & Plan:   Principal Problem:   Acute CVA (cerebrovascular accident) ([place]) Active Problems:   DM type 2 causing vascular disease ([place])   Essential hypertension, benign   Mixed hyperlipidemia   CAD (coronary artery disease)   Subdural hematoma ([facility])   Seizure disorder ([facility])   OSA (obstructive sleep apnea)   Acute metabolic encephalopathy   Acute metabolic encephalopathy, multifactorial, resolved - Recent CVA, SDH, recent covid infection and high dose steroids with hospitalization all likely contributing to mental status changes - Neurosurgery signed off given small size and stable subdural hematoma - Neurology following, repeat CT head shows stable hematoma - Continue Brilinta in the setting of recent stenting. - Patient otherwise appears to be back to baseline today able to answer simple direct questions which was his baseline per signout.   SDH -Appears to have been  spontaneous at [place] -Neurosurgery has signed off -Repeat CT head stable - resume Brilinta as above   COVID, questionably incidental finding, resolved -Positive for COVID on 1/20, asymptomatic other than mental status changes which are likely due to other confounding issues as above. -Completed antiviral/steroid course without improvement in symptoms -There is no indication to retest as covid test will remain positive well past acute infection phase (upwards of 90 days in some patients) -He would have completed quarantine as an outpatient with mild symptoms (vs asymptomatic) 08/25/22 given his lack of symptoms/admitted for reasons other than covid infection -This was initially thought to be the cause of his AMS but unlikely given above   HTN -Permissive hypertension window closing - transition towards normal BP per neuro -Currently borderline hypotensive/bradycardic - continue to hold home medications (amlodipine/HCTZ)   HLD -FLP unremarkable -Continue Lipitor 80 mg daily when able to take PO   DM -A1c was 5.0 on 1/21 -Hold glimepride -Will order moderate-scale SSI   CAD, history -s/p CABG -Hold hypertensive medications currently normotensive   Bipolar/schizophrenia  -Sister denies both diagnoses -h/o polysubstance abuse, in remission x 2 years (trigger finger surgery last year -> relapse for a brief period of time)   History of seizures -None since stent placement -Depakote and Lamictal are on hold for now -Depakote level within normal limits -Currently on IV Keppra, transition per neurology as above   OSA -Does not wear CPAP, no machine at this time  DVT prophylaxis: None given subdural hematoma as above Code Status: Full Family Communication: None present  Status is: Inpatient  Dispo: The patient is from: Home  Anticipated d/c is to: CIR              Anticipated d/c date is: 24 to 48 hours pending EEG and possible antiepileptic medication  titration              Patient currently not medically stable for discharge  Consultants:  Neurosurgery, neurology  Procedures:  None  Antimicrobials:  None indicated, completed antiviral course at previous facility for COVID infection as above  Subjective: No acute issues or events overnight, appears to be back to baseline mental status and interview this morning, review of systems negative other than back pain which she states is chronic.  Objective: Vitals:   08/28/22 1515 08/28/22 1948 08/28/22 2332 08/29/22 0340  BP: (!) 150/83 (!) 149/85 (!) 160/90 (!) 149/76  Pulse: 64 65 61 60  Resp: '16 16 16 17  '$ Temp: 98.4 F (36.9 C) 98.5 F (36.9 C) 98.2 F (36.8 C) 98.5 F (36.9 C)  TempSrc: Oral Oral Oral Oral  SpO2: 100% 100% 100% 100%  Weight:      Height:        Intake/Output Summary (Last 24 hours) at 08/29/2022 0741 Last data filed at 08/29/2022 0322 Gross per 24 hour  Intake 1432.79 ml  Output 950 ml  Net 482.79 ml    Filed Weights   08/27/22 1033 08/27/22 1620  Weight: 52.9 kg 52.9 kg    Examination:  General:  Pleasantly resting in bed, No acute distress.  Answer simple questions with 1-2 word answers HEENT:  Normocephalic atraumatic.  Sclerae nonicteric, noninjected.  Extraocular movements intact bilaterally. Neck:  Without mass or deformity.  Trachea is midline. Lungs:  Clear to auscultate bilaterally without rhonchi, wheeze, or rales. Heart:  Regular rate and rhythm.  Without murmurs, rubs, or gallops. Abdomen:  Soft, nontender, nondistended.  Without guarding or rebound. Extremities: Without cyanosis, clubbing, edema, or obvious deformity.  Data Reviewed: I have personally reviewed following labs and imaging studies  CBC: Recent Labs  Lab 08/29/22 0346  WBC 6.7  HGB 10.6*  HCT 30.2*  MCV 89.3  PLT 268   Basic Metabolic Panel: Recent Labs  Lab 08/29/22 0346  NA 144  K 3.3*  CL 110  CO2 25  GLUCOSE 86  BUN 18  CREATININE 0.98  CALCIUM  8.4*   GFR: Estimated Creatinine Clearance: 61.5 mL/min (by C-G formula based on SCr of 0.98 mg/dL). Liver Function Tests: No results for input(s): "AST", "ALT", "ALKPHOS", "BILITOT", "PROT", "ALBUMIN" in the last 168 hours. No results for input(s): "LIPASE", "AMYLASE" in the last 168 hours. No results for input(s): "AMMONIA" in the last 168 hours. Coagulation Profile: No results for input(s): "INR", "PROTIME" in the last 168 hours. Cardiac Enzymes: No results for input(s): "CKTOTAL", "CKMB", "CKMBINDEX", "TROPONINI" in the last 168 hours. BNP (last 3 results) No results for input(s): "PROBNP" in the last 8760 hours. HbA1C: No results for input(s): "HGBA1C" in the last 72 hours. CBG: Recent Labs  Lab 08/27/22 2033 08/28/22 0005 08/28/22 1629 08/28/22 2109 08/29/22 0622  GLUCAP 230* 136* 167* 104* 102*    Lipid Profile: Recent Labs    08/28/22 0329  CHOL 102  HDL 42  LDLCALC 46  TRIG 71  CHOLHDL 2.4    Thyroid Function Tests: No results for input(s): "TSH", "T4TOTAL", "FREET4", "T3FREE", "THYROIDAB" in the last 72 hours. Anemia Panel: No results for input(s): "VITAMINB12", "FOLATE", "FERRITIN", "TIBC", "IRON", "RETICCTPCT" in the last 72 hours. Sepsis Labs: No results for input(s): "PROCALCITON", "LATICACIDVEN" in  the last 168 hours.  Recent Results (from the past 240 hour(s))  Resp panel by RT-PCR (RSV, Flu A&B, Covid) Anterior Nasal Swab     Status: Abnormal   Collection Time: 08/27/22  3:03 PM   Specimen: Anterior Nasal Swab  Result Value Ref Range Status   SARS Coronavirus 2 by RT PCR POSITIVE (A) NEGATIVE Final    Comment: (NOTE) SARS-CoV-2 target nucleic acids are DETECTED.  The SARS-CoV-2 RNA is generally detectable in upper respiratory specimens during the acute phase of infection. Positive results are indicative of the presence of the identified virus, but do not rule out bacterial infection or co-infection with other pathogens not detected by the  test. Clinical correlation with patient history and other diagnostic information is necessary to determine patient infection status. The expected result is Negative.  Fact Sheet for Patients: EntrepreneurPulse.com.au  Fact Sheet for Healthcare Providers: IncredibleEmployment.be  This test is not yet approved or cleared by the [place] FDA and  has been authorized for detection and/or diagnosis of SARS-CoV-2 by FDA under an Emergency Use Authorization (EUA).  This EUA will remain in effect (meaning this test can be used) for the duration of  the COVID-19 declaration under Section 564(b)(1) of the A ct, 21 U.S.C. section 360bbb-3(b)(1), unless the authorization is terminated or revoked sooner.     Influenza A by PCR NEGATIVE NEGATIVE Final   Influenza B by PCR NEGATIVE NEGATIVE Final    Comment: (NOTE) The Xpert Xpress SARS-CoV-2/FLU/RSV plus assay is intended as an aid in the diagnosis of influenza from Nasopharyngeal swab specimens and should not be used as a sole basis for treatment. Nasal washings and aspirates are unacceptable for Xpert Xpress SARS-CoV-2/FLU/RSV testing.  Fact Sheet for Patients: EntrepreneurPulse.com.au  Fact Sheet for Healthcare Providers: IncredibleEmployment.be  This test is not yet approved or cleared by the [place] FDA and has been authorized for detection and/or diagnosis of SARS-CoV-2 by FDA under an Emergency Use Authorization (EUA). This EUA will remain in effect (meaning this test can be used) for the duration of the COVID-19 declaration under Section 564(b)(1) of the Act, 21 U.S.C. section 360bbb-3(b)(1), unless the authorization is terminated or revoked.     Resp Syncytial Virus by PCR NEGATIVE NEGATIVE Final    Comment: (NOTE) Fact Sheet for Patients: EntrepreneurPulse.com.au  Fact Sheet for Healthcare  Providers: IncredibleEmployment.be  This test is not yet approved or cleared by the [place] FDA and has been authorized for detection and/or diagnosis of SARS-CoV-2 by FDA under an Emergency Use Authorization (EUA). This EUA will remain in effect (meaning this test can be used) for the duration of the COVID-19 declaration under Section 564(b)(1) of the Act, 21 U.S.C. section 360bbb-3(b)(1), unless the authorization is terminated or revoked.  Performed at [facility], [place] [address], [place], [place] [address]          Radiology Studies: CT HEAD WO CONTRAST (5MM)  Result Date: 08/28/2022 CLINICAL DATA:  Stroke, hemorrhagic EXAM: CT HEAD WITHOUT CONTRAST TECHNIQUE: Contiguous axial images were obtained from the base of the skull through the vertex without intravenous contrast. RADIATION DOSE REDUCTION: This exam was performed according to the departmental dose-optimization program which includes automated exposure control, adjustment of the mA and/or kV according to patient size and/or use of iterative reconstruction technique. COMPARISON:  CTA head and neck 08/26/2022. MR head without and with contrast 08/25/2022 at [place]: Brain: Trace subdural hematoma along the posterior falx and posterior the occipital lobe is  stable. No new hemorrhage is present. Remote infarcts of the left basal ganglia, anterior left frontal lobe and corona radiata are stable. No acute infarct is present. Ex vacuo dilation of the left lateral ventricle is present. The brainstem and cerebellum are within normal limits. Midline structures are within normal limits. Vascular: Left MCA stent is stable. Atherosclerotic calcifications are present within the cavernous internal carotid arteries. No other hyperdense vessel is present. Skull: Calvarium is intact. No focal lytic or blastic lesions are present. No significant extracranial soft tissue lesion is present. Sinuses/Orbits: A  left mastoid effusion is present. The paranasal sinuses and mastoid air cells are otherwise clear. The globes and orbits are within normal limits. IMPRESSION: 1. Stable trace subdural hematoma along the posterior falx and posterior the occipital lobe. 2. No new hemorrhage. 3. 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 4. Stable left MCA stent. 5. Left mastoid effusion. The above was relayed via text pager to Dr. Kerney Elbe on 08/28/2022 at 13:31 . Electronically Signed   By: San Morelle M.D.   On: 08/28/2022 13:32   ECHOCARDIOGRAM COMPLETE  Result Date: 08/28/2022    ECHOCARDIOGRAM REPORT   Patient Name:   LIVINGSTON DENNER Date of Exam: 08/28/2022 Medical Rec #:  [id number]         Height:       65.0 in Accession #:    [id number]        Weight:       116.6 lb Date of Birth:  06/28/1966          BSA:          1.573 m Patient Age:    62 years          BP:           130/84 mmHg Patient Gender: M                 HR:           56 bpm. Exam Location:  Inpatient Procedure: 2D Echo, Cardiac Doppler and Color Doppler Indications:    I63.9 Stroke  History:        Patient has prior history of Echocardiogram examinations, most                 recent 12/17/2020. Stroke; Risk Factors:Sleep Apnea, Diabetes,                 Dyslipidemia and Former Smoker.  Sonographer:    Wilkie Aye RVT RCS Referring Phys: [id number] JENNIFER YATES  Sonographer Comments: Image acquisition challenging due to uncooperative patient and Patient unable to turn; images off axis; patient kept pushing Techs hand away, exam ended. IMPRESSIONS  1. Left ventricular ejection fraction, by estimation, is 60 to 65%. The left ventricle has normal function. The left ventricle has no regional wall motion abnormalities. There is mild left ventricular hypertrophy. Left ventricular diastolic parameters are consistent with Grade I diastolic dysfunction (impaired relaxation).  2. Right ventricular systolic  function is normal. The right ventricular size is normal.  3. The mitral valve is normal in structure. Mild mitral valve regurgitation. No evidence of mitral stenosis.  4. The aortic valve is tricuspid. Aortic valve regurgitation is not visualized. Aortic valve sclerosis is present, with no evidence of aortic valve stenosis.  5. The inferior vena cava is normal in size with greater than 50% respiratory variability, suggesting right atrial pressure of 3 mmHg. Comparison(s): EF 65-70%. FINDINGS  Left Ventricle: Left ventricular ejection fraction, by estimation, is 60 to 65%. The left ventricle has normal function. The left ventricle has no regional wall motion abnormalities. The left ventricular internal cavity size was normal in size. There is  mild left ventricular hypertrophy. Left ventricular diastolic parameters are consistent with Grade I diastolic dysfunction (impaired relaxation). Right Ventricle: The right ventricular size is normal. Right ventricular systolic function is normal. Left Atrium: Left atrial size was normal in size. Right Atrium: Right atrial size was normal in size. Pericardium: There is no evidence of pericardial effusion. Mitral Valve: The mitral valve is normal in structure. Mild mitral valve regurgitation. No evidence of mitral valve stenosis. Tricuspid Valve: The tricuspid valve is normal in structure. Tricuspid valve regurgitation is mild . No evidence of tricuspid stenosis. Aortic Valve: The aortic valve is tricuspid. Aortic valve regurgitation is not visualized. Aortic valve sclerosis is present, with no evidence of aortic valve stenosis. Aortic valve mean gradient measures 1.0 mmHg. Aortic valve peak gradient measures 2.6  mmHg. Aortic valve area, by VTI measures 4.70 cm. Pulmonic Valve: The pulmonic valve was normal in structure. Pulmonic valve regurgitation is trivial. No evidence of pulmonic stenosis. Aorta: The aortic root is normal in size and structure. Venous: The inferior vena  cava is normal in size with greater than 50% respiratory variability, suggesting right atrial pressure of 3 mmHg. IAS/Shunts: The interatrial septum is aneurysmal. No atrial level shunt detected by color flow Doppler.  LEFT VENTRICLE PLAX 2D LVIDd:         3.70 cm   Diastology LVIDs:         2.80 cm   LV e' medial:   6.76 cm/s LV PW:         1.35 cm   LV E/e' medial: 9.0 LV IVS:        1.15 cm LVOT diam:     2.30 cm LV SV:         64 LV SV Index:   41 LVOT Area:     4.15 cm  RIGHT VENTRICLE RV S prime:     9.41 cm/s LEFT ATRIUM         Index LA diam:    2.90 cm 1.84 cm/m  AORTIC VALVE                    PULMONIC VALVE AV Area (Vmax):    4.08 cm     PV Vmax:          0.75 m/s AV Area (Vmean):   3.41 cm     PV Peak grad:     2.2 mmHg AV Area (VTI):     4.70 cm     PR End Diast Vel: 4.33 msec AV Vmax:           80.50 cm/s AV Vmean:          55.100 cm/s AV VTI:            0.137 m AV Peak Grad:      2.6 mmHg AV Mean Grad:      1.0 mmHg LVOT Vmax:         79.00 cm/s LVOT Vmean:        45.200 cm/s LVOT VTI:          0.155 m LVOT/AV VTI ratio: 1.13  AORTA Ao Root diam: 3.70 cm MITRAL VALVE               TRICUSPID VALVE MV  Area (PHT): 2.26 cm    TR Peak grad:   26.8 mmHg MV Decel Time: 336 msec    TR Vmax:        259.00 cm/s MV E velocity: 60.60 cm/s MV A velocity: 87.30 cm/s  SHUNTS MV E/A ratio:  0.69        Systemic VTI:  0.16 m                            Systemic Diam: 2.30 cm Kirk Ruths MD Electronically signed by Kirk Ruths MD Signature Date/Time: 08/28/2022/12:15:17 PM    Final    DG CHEST PORT 1 VIEW  Result Date: 08/27/2022 CLINICAL DATA:  PICC line placement EXAM: PORTABLE CHEST 1 VIEW COMPARISON:  Chest x-ray August 20, 2022 FINDINGS: A new left PICC line is identified with the distal tip near the caval atrial junction. The tip may be just beyond the caval atrial junction the patient may benefit from pulling the PICC line back 2 cm. The heart, hila, and mediastinum are unremarkable. No  pneumothorax. No nodules, masses, or focal infiltrates. IMPRESSION: The new left PICC line is in good position. The tip may be just beyond the caval atrial junction. The patient may benefit from pulling the PICC line back 2 cm. These results will be called to the ordering clinician or representative by the Radiologist Assistant, and communication documented in the PACS or [employer]. Electronically Signed   By: Dorise Bullion III M.D.   On: 08/27/2022 16:04    Scheduled Meds:  atorvastatin  80 mg Oral QHS   Chlorhexidine Gluconate Cloth  6 each Topical Daily   cycloSPORINE  1 drop Both Eyes BID   escitalopram  20 mg Oral Daily   insulin aspart  0-15 Units Subcutaneous TID WC   insulin aspart  0-5 Units Subcutaneous QHS   isosorbide mononitrate  30 mg Oral Daily   sodium chloride flush  10-40 mL Intracatheter Q12H   ticagrelor  90 mg Oral BID   Continuous Infusions:  sodium chloride 50 mL/hr at 08/29/22 0016   levETIRAcetam 1,000 mg (08/29/22 0422)     LOS: 2 days   Time spent: 83mn  Melvin C Farren Nelles, DO [facility]  If 7PM-7AM, please contact night-coverage [email or web address]  08/29/2022, 7:41 AM

## 2022-08-29 NOTE — Progress Notes (Signed)
Mobility Specialist: Progress Note   08/29/22 1733  Mobility  Activity Transferred from chair to bed  Level of Assistance +2 (takes two people)  Assistive Device Other (Comment) (HHA)  Distance Ambulated (ft) 2 ft  Activity Response Tolerated well  Mobility Referral Yes  $Mobility charge 1 Mobility   Pt assisted back to bed per request. +2 HHA minA to stand and pivot to the bed. No c/o throughout. Pt back to bed with NT present in the room.   Melvin Taylor Mobility Specialist Please contact via SecureChat or Rehab office at [phone]

## 2022-08-29 NOTE — Progress Notes (Shared)
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  Patient had trace subdural hematoma on admission, but this appears stable, so Brilinta was continued to prevent stent occlusion.  There was originally concern for catatonia, but patient at this time is able to respond to commands and answer questions verbally.  Subjective: Patient has no complaints this morning, vital signs remained stable.  Exam: Vitals:   08/29/22 0340 08/29/22 0807  BP: (!) 149/76 (!) 128/51  Pulse: 60 64  Resp: 17 18  Temp: 98.5 F (36.9 C) 97.6 F (36.4 C)  SpO2: 100% 100%   Gen: In bed, NAD Resp: non-labored breathing, no acute distress  Neuro: Mental Status: Patient is drowsy and oriented to self only.  Able to follow single step and two-step commands Cranial Nerves: Pupils equal round and reactive, extraocular movements intact, facial sensation symmetrical, face symmetrical, hearing intact to voice, phonation normal, shoulder shrug symmetrical, tongue midline Motor: Able to move left upper extremity and bilateral lower extremities with good antigravity strength, some antigravity strength in right upper extremity which is contracted. Sensory: Intact to light touch throughout DTR: 2+ throughout Gait: Deferred  Pertinent Labs:    Latest Ref Rng & Units 08/29/2022    3:46 AM 08/18/2022    8:34 AM 08/04/2022   12:00 PM  CBC  WBC 4.0 - 10.5 K/uL 6.7     Hemoglobin 13.0 - 17.0 g/dL 10.6  9.4  6.3   Hematocrit 39.0 - 52.0 % 30.2     Platelets 150 - 400 K/uL 165          Latest Ref Rng & Units 08/29/2022    3:46 AM 08/05/2022    9:04 AM 10/13/2021    2:45 PM  BMP  Glucose 70 - 99 mg/dL 86  120  158   BUN 6 - 20 mg/dL '18  16  30   '$ Creatinine 0.61 - 1.24 mg/dL 0.98  1.59   1.85   BUN/Creat Ratio 9 - 20  10    Sodium 135 - 145 mmol/L 144  140  138   Potassium 3.5 - 5.1 mmol/L 3.3  5.0  4.3   Chloride 98 - 111 mmol/L 110  104  106   CO2 22 - 32 mmol/L '25  22  26   '$ Calcium 8.9 - 10.3 mg/dL 8.4  8.8  8.6     Imaging Reviewed:  CT head: Stable trace subdural hematoma on the left side.,  Stable left MCA stent, remote infarct of left basal ganglia anterior left frontal lobe and corona radiata  Assessment: 57 year old patient with above past medical history presents with mutism.  He initially would not speak but would intermittently follow simple commands.  On exam, today, he is much improved and is able to speak, oriented to self only and able to follow simple and two-step commands.  Given seizure history, it is reasonable to obtain EEG to rule out seizures as cause of mutism.  Impression: Catatonia versus seizure activity  Recommendations: 1) routine EEG 2) continue Keppra 1 g every 12 hours 3) continue Brilinta to prevent in-stent restenosis  Cortney E Carron Curie , MSN, AGACNP-BC [facility] See Amion for schedule and pager information 08/29/2022 9:16 AM   NEUROHOSPITALIST ADDENDUM Performed a face to face diagnostic evaluation.   I have reviewed the  contents of history and physical exam as documented by PA/ARNP/Resident and agree with above documentation.  I have discussed and formulated the above plan as documented. Edits to the note have been made as needed.  Impression/Key exam findings/Plan: improved today, now talking and oriented to self. Routine EEG negative. Exam with known R sided weakness and RUE contractures. Given improvement, no further workup at this time.  We will signoff. Please feel free to contact us with any questions or concerns.  Donnetta Simpers, MD [facility] [phone]   If 7pm to 7am, please call on call as listed on AMION.

## 2022-08-29 NOTE — Progress Notes (Signed)
Physical Therapy Treatment Patient Details Name: Melvin Taylor MRN: [id number] DOB: 1966/06/04 Today's Date: 08/29/2022   History of Present Illness Melvin Taylor is a 57 y.o. male who was admitted to [facility] from 1/20-27 with AMS, minimally improving during hospitalization.  He was noted to have a persistent punctate focus of restricted diffusion in the L medial temporal lobe, concerning for acute/subacute CVA.  MRI/CT with SDH that developed during the hospitalization, 4 mm in L occipital region. PMH: bipolar d/o vs/ schizophrenia, chronic chest wall/neck/back pain, CAD s/p CABG, DM, HTN, polysubstance abuse, OSA, and seizure d/o    PT Comments    Pt progressing steadily towards his physical therapy goals; requiring less assist overall and able to initiate taking a few steps today. Pt requiring up to two person moderate assist for transfers and ambulating ~3 steps with HHA and close chair follow. Continues with R hemiplegia, inattention, impaired cognition, standing balance. Continue to recommend acute inpatient rehab (AIR) for post-acute therapy needs.    Recommendations for follow up therapy are one component of a multi-disciplinary discharge planning process, led by the attending physician.  Recommendations may be updated based on patient status, additional functional criteria and insurance authorization.  Follow Up Recommendations  Acute inpatient rehab (3hours/day)     Assistance Recommended at Discharge Frequent or constant Supervision/Assistance  Patient can return home with the following A lot of help with walking and/or transfers;A lot of help with bathing/dressing/bathroom;Direct supervision/assist for medications management;Direct supervision/assist for financial management;Assist for transportation;Help with stairs or ramp for entrance;Assistance with cooking/housework   Equipment Recommendations   (TBD at next venue)    Recommendations for Other Services Rehab consult      Precautions / Restrictions Precautions Precautions: Fall Precaution Comments: R hemiplegia Restrictions Weight Bearing Restrictions: No     Mobility  Bed Mobility Overal bed mobility: Needs Assistance Bed Mobility: Supine to Sit     Supine to sit: Mod assist, +2 for safety/equipment     General bed mobility comments: Pt initiating well, exiting towards left side of bed, assist at trunk to bring upright    Transfers Overall transfer level: Needs assistance Equipment used: 2 person hand held assist (2 person face to face transfer with gait belt) Transfers: Sit to/from Stand, Bed to chair/wheelchair/BSC Sit to Stand: Mod assist, +2 safety/equipment Stand pivot transfers: Mod assist, +2 safety/equipment         General transfer comment: Cues for pushing off with left hand, modA to power up and pivot towards left to chair.    Ambulation/Gait Ambulation/Gait assistance: Mod assist, +2 safety/equipment, +2 physical assistance Gait Distance (Feet): 3 Feet Assistive device: 2 person hand held assist Gait Pattern/deviations: Step-to pattern, Decreased stride length, Shuffle, Decreased weight shift to right Gait velocity: decreased Gait velocity interpretation: <1.31 ft/sec, indicative of household ambulator   General Gait Details: Increased R foot external rotation, decreased bilateral foot clearance, modA for standing balance and close chair follow   Stairs             Wheelchair Mobility    Modified Rankin (Stroke Patients Only) Modified Rankin (Stroke Patients Only) Pre-Morbid Rankin Score: Moderate disability Modified Rankin: Moderately severe disability     Balance Overall balance assessment: Needs assistance Sitting-balance support: Feet supported, Single extremity supported Sitting balance-Leahy Scale: Fair     Standing balance support: Single extremity supported, During functional activity Standing balance-Leahy Scale: Poor Standing balance comment:  reliant on external support  Cognition Arousal/Alertness: Awake/alert Behavior During Therapy: Flat affect Overall Cognitive Status: Impaired/Different from baseline Area of Impairment: Attention, Memory, Following commands, Safety/judgement, Awareness, Problem solving                   Current Attention Level: Sustained Memory: Decreased short-term memory Following Commands: Follows one step commands with increased time Safety/Judgement: Decreased awareness of safety, Decreased awareness of deficits Awareness: Intellectual Problem Solving: Slow processing, Difficulty sequencing, Requires verbal cues, Requires tactile cues General Comments: per sister, [name], pt normally able to hold a conversation, pt now with delayed response time and one word answers        Exercises      General Comments        Pertinent Vitals/Pain Pain Assessment Pain Assessment: Faces Faces Pain Scale: Hurts little more Pain Location: R UE with PROM into extension Pain Descriptors / Indicators: Discomfort, Grimacing Pain Intervention(s): Monitored during session    Home Living                          Prior Function            PT Goals (current goals can now be found in the care plan section) Acute Rehab PT Goals Patient Stated Goal: didn't state PT Goal Formulation: With patient/family Time For Goal Achievement: 09/11/22 Potential to Achieve Goals: Good Progress towards PT goals: Progressing toward goals    Frequency    Min 3X/week      PT Plan Frequency needs to be updated    Co-evaluation              AM-PAC PT "6 Clicks" Mobility   Outcome Measure  Help needed turning from your back to your side while in a flat bed without using bedrails?: A Lot Help needed moving from lying on your back to sitting on the side of a flat bed without using bedrails?: A Lot Help needed moving to and from a bed to a chair (including a  wheelchair)?: A Lot Help needed standing up from a chair using your arms (e.g., wheelchair or bedside chair)?: A Lot Help needed to walk in hospital room?: Total Help needed climbing 3-5 steps with a railing? : Total 6 Click Score: 10    End of Session Equipment Utilized During Treatment: Gait belt Activity Tolerance: Patient tolerated treatment well Patient left: with chair alarm set;with call bell/phone within reach;in chair Nurse Communication: Mobility status PT Visit Diagnosis: Difficulty in walking, not elsewhere classified (R26.2);Hemiplegia and hemiparesis Hemiplegia - Right/Left: Right Hemiplegia - dominant/non-dominant: Dominant Hemiplegia - caused by: Cerebral infarction     Time: 1407-1430 PT Time Calculation (min) (ACUTE ONLY): 23 min  Charges:  $Therapeutic Activity: 23-37 mins                     Wyona Almas, PT, DPT Acute Rehabilitation Services Office [phone]    Deno Etienne 08/29/2022, 4:35 PM

## 2022-08-29 NOTE — Progress Notes (Signed)
Initial Nutrition Assessment  DOCUMENTATION CODES:  Severe malnutrition in context of social or environmental circumstances  INTERVENTION:  Continue diet per SLP recommendations Nepro Shake po TID, each supplement provides 425 kcal and 19 grams protein. Nepro is nectar thick per manufacturer  Magic cup TID with meals, each supplement provides 290 kcal and 9 grams of protein MVI with minerals daily  NUTRITION DIAGNOSIS:  Severe Malnutrition related to social / environmental circumstances as evidenced by severe fat depletion, severe muscle depletion.  GOAL:  Patient will meet greater than or equal to 90% of their needs  MONITOR:  PO intake, Supplement acceptance, Labs, Skin  REASON FOR ASSESSMENT:  Consult Assessment of nutrition requirement/status  ASSESSMENT:  Pt with hx DM type 2, CAD s/p CABG, gout, HTN, CVA, and hx of polysubstance abuse presented to ED with AMS  1/27 - SLP evaluation, DYS3, nectar thick liquids 1/29 - SLP evaluation, DYS 2, nectar  Pt resting in bed at the time of assessment. Wakes upon physical touch but otherwise does not interact when spoken to. Severely malnourished on exam. Recorded intake of meals is poor. SLP downgraded diet today. Will add nutrition supplements to augment intake  Average Meal Intake: 1/28: 25% intake x 1 recorded meals  Nutritionally Relevant Medications: Scheduled Meds:  atorvastatin  80 mg Oral QHS   insulin aspart  0-15 Units Subcutaneous TID WC   insulin aspart  0-5 Units Subcutaneous QHS   Continuous Infusions:  sodium chloride 50 mL/hr at 08/29/22 1006   Labs Reviewed: K 3.3 CBG ranges from 102-257 mg/dL over the last 24 hours  NUTRITION - FOCUSED PHYSICAL EXAM: Flowsheet Row Most Recent Value  Orbital Region Moderate depletion  Upper Arm Region Severe depletion  Thoracic and Lumbar Region Severe depletion  Buccal Region Severe depletion  Temple Region Severe depletion  Clavicle Bone Region Moderate depletion   Clavicle and Acromion Bone Region Severe depletion  Scapular Bone Region Moderate depletion  Dorsal Hand Moderate depletion  Patellar Region Severe depletion  Anterior Thigh Region Severe depletion  Posterior Calf Region Severe depletion  Edema (RD Assessment) None  Hair Reviewed  Eyes Reviewed  Mouth Reviewed  Skin Reviewed  Nails Reviewed   Diet Order:   Diet Order             DIET DYS 2 Room service appropriate? No; Fluid consistency: Nectar Thick  Diet effective now                   EDUCATION NEEDS:  Not appropriate for education at this time  Skin:  Skin Assessment: Reviewed RN Assessment  Last BM:  1/29 - type 6  Height:  Ht Readings from Last 1 Encounters:  08/27/22 '5\' 5"'$  (1.651 m)    Weight:  Wt Readings from Last 1 Encounters:  08/27/22 52.9 kg    Ideal Body Weight:  61.8 kg  BMI:  Body mass index is 19.41 kg/m.  Estimated Nutritional Needs:  Kcal:  1800-2000 kcal/d Protein:  90-105g/d Fluid:  1.8-2L/d    Ranell Patrick, RD, LDN Clinical Dietitian RD pager # available in [place]  After hours/weekend pager # available in [facility]

## 2022-08-29 NOTE — Progress Notes (Addendum)
This nurse at bedside to place soft splint to right arm, pt yells out in extreme pain 10/10 when right arm is minimally manipulated, no pain at resting position but pt is guarding. Pt wants to wait and try with therapy again today.

## 2022-08-29 NOTE — Telephone Encounter (Signed)
Noted  

## 2022-08-29 NOTE — Telephone Encounter (Signed)
Called and spoke to pt's sister and she stated that the pt has already been moved to a [facility] hospital.

## 2022-08-29 NOTE — Plan of Care (Signed)
  Problem: Education: Goal: Knowledge of General Education information will improve Description: Including pain rating scale, medication(s)/side effects and non-pharmacologic comfort measures Outcome: Progressing   Problem: Health Behavior/Discharge Planning: Goal: Ability to manage health-related needs will improve Outcome: Progressing   Problem: Clinical Measurements: Goal: Ability to maintain clinical measurements within normal limits will improve Outcome: Progressing Goal: Will remain free from infection Outcome: Progressing Goal: Diagnostic test results will improve Outcome: Progressing Goal: Respiratory complications will improve Outcome: Progressing   Problem: Activity: Goal: Risk for activity intolerance will decrease Outcome: Progressing   Problem: Nutrition: Goal: Adequate nutrition will be maintained Outcome: Progressing   Problem: Coping: Goal: Level of anxiety will decrease Outcome: Progressing   Problem: Elimination: Goal: Will not experience complications related to bowel motility Outcome: Progressing Goal: Will not experience complications related to urinary retention Outcome: Progressing   Problem: Pain Managment: Goal: General experience of comfort will improve Outcome: Progressing   Problem: Safety: Goal: Ability to remain free from injury will improve Outcome: Progressing   Problem: Skin Integrity: Goal: Risk for impaired skin integrity will decrease Outcome: Progressing   Problem: Education: Goal: Ability to describe self-care measures that may prevent or decrease complications (Diabetes Survival Skills Education) will improve Outcome: Progressing Goal: Individualized Educational Video(s) Outcome: Progressing   Problem: Coping: Goal: Ability to adjust to condition or change in health will improve Outcome: Progressing   Problem: Fluid Volume: Goal: Ability to maintain a balanced intake and output will improve Outcome: Progressing    Problem: Health Behavior/Discharge Planning: Goal: Ability to identify and utilize available resources and services will improve Outcome: Progressing Goal: Ability to manage health-related needs will improve Outcome: Progressing   Problem: Metabolic: Goal: Ability to maintain appropriate glucose levels will improve Outcome: Progressing   Problem: Nutritional: Goal: Maintenance of adequate nutrition will improve Outcome: Progressing Goal: Progress toward achieving an optimal weight will improve Outcome: Progressing   Problem: Skin Integrity: Goal: Risk for impaired skin integrity will decrease Outcome: Progressing   Problem: Tissue Perfusion: Goal: Adequacy of tissue perfusion will improve Outcome: Progressing   Problem: Education: Goal: Knowledge of disease or condition will improve Outcome: Progressing Goal: Knowledge of secondary prevention will improve (MUST DOCUMENT ALL) Outcome: Progressing Goal: Knowledge of patient specific risk factors will improve Elta [place] N/A or DELETE if not current risk factor) Outcome: Progressing   Problem: Ischemic Stroke/TIA Tissue Perfusion: Goal: Complications of ischemic stroke/TIA will be minimized Outcome: Progressing

## 2022-08-29 NOTE — Progress Notes (Signed)
Inpatient Rehab Admissions Coordinator:    I spoke with pt.'s sister, [name], regarding potential CIR admit. She is interested, but not sure if family will be able to work out 24/7 min-mod assist at d/c. She states she will talk things over with family tonight and get back with me.   Clemens Catholic, [place], [place] Admissions Coordinator  [phone] ([place]) [phone] (office)

## 2022-08-29 NOTE — Telephone Encounter (Signed)
Please advise sister the hospital can transfer him. if they are not equip to handle the care that is needed, the will automatically transfer him. We do not order transfers from hospital to hospital.

## 2022-08-29 NOTE — Progress Notes (Signed)
Speech Language Pathology Treatment: Dysphagia  Patient Details Name: Melvin Taylor MRN: [id number] DOB: 12-24-1965 Today's Date: 08/29/2022 Time: 6945-0388 SLP Time Calculation (min) (ACUTE ONLY): 11 min  Assessment / Plan / Recommendation Clinical Impression  Pt seen for dysphagia therapy with trials of thin liquid, nectar, Dys 2 (graham cracker pieces in applesauce) and Dys 3 (graham cracker). He coughed once before trials and consistent immediate coughs after thin water. Nectar thick diminished the coughing but there was one delayed cough after consuming sequential larger straw sips nectar. His mastication and bolus prep was mild-moderately prolonged with the cracker and he would benefit from texture downgrade to Dys 2 (pt in agreement and wants to pursue having all foods chopped and not just his meats) given recent dental extraction. Recommend continue nectar thick liquids and pt would benefit from MBS.    HPI HPI: 57 y.o. male with medical history significant of bipolar d/o vs/ schizophrenia, chronic chest wall/neck/back pain, CAD s/p CABG, DM, HTN, polysubstance abuse, OSA, and seizure d/o presenting with AMS.  He was admitted to [facility] from 1/20-27 with AMS, minimally improving during hospitalization.  He was noted to have a persistent punctate focus of restricted diffusion in the L medial temporal lobe, concerning for acute/subacute CVA.  MRI/CT with SDH that developed during the hospitalization, 4 mm in L occipital region.  He has been on IV Keppra throughout the hospitalization without apparent seizure activity.  He is able to answer very limited questions, none about orientation.  He does follow some commands      SLP Plan  MBS      Recommendations for follow up therapy are one component of a multi-disciplinary discharge planning process, led by the attending physician.  Recommendations may be updated based on patient status, additional functional criteria and insurance  authorization.    Recommendations  Diet recommendations: Dysphagia 2 (fine chop);Nectar-thick liquid Liquids provided via: Cup;Straw Medication Administration: Whole meds with puree Supervision: Staff to assist with self feeding;Full supervision/cueing for compensatory strategies Compensations: Slow rate;Small sips/bites;Minimize environmental distractions;Lingual sweep for clearance of pocketing Postural Changes and/or Swallow Maneuvers: Seated upright 90 degrees                Oral Care Recommendations: Oral care BID Follow Up Recommendations:  (TBD) Assistance recommended at discharge: Frequent or constant Supervision/Assistance SLP Visit Diagnosis: Dysphagia, unspecified (R13.10);Dysphagia, oral phase (R13.11) Plan: MBS           Houston Siren  08/29/2022, 1:35 PM

## 2022-08-29 NOTE — Procedures (Signed)
Patient Name: Melvin Taylor  MRN: [id number]  Epilepsy Attending: Lora Havens  Referring Physician/Provider: Katy Apo, NP  Date: 08/28/2022 Duration: 21.18 mins  Patient history: 57yo M with ams. EEG to evaluate for seizure  Level of alertness: Awake  AEDs during EEG study: None  Technical aspects: This EEG study was done with scalp electrodes positioned according to the 10-20 International system of electrode placement. Electrical activity was reviewed with band pass filter of 1-'70Hz'$ , sensitivity of 7 uV/mm, display speed of 52m/sec with a '60Hz'$  notched filter applied as appropriate. EEG data were recorded continuously and digitally stored.  Video monitoring was available and reviewed as appropriate.  Description: EEG showed continuous generalized 3 to 6 Hz theta-delta slowing. Hyperventilation and photic stimulation were not performed.     ABNORMALITY - Continuous slow, generalized  IMPRESSION: This study is suggestive of moderate to severe diffuse encephalopathy, nonspecific etiology. No seizures or epileptiform discharges were seen throughout the recording.  [place] Petree OBarbra Sarks

## 2022-08-30 ENCOUNTER — Inpatient Hospital Stay (HOSPITAL_COMMUNITY): Payer: Medicaid Other

## 2022-08-30 DIAGNOSIS — I639 Cerebral infarction, unspecified: Secondary | ICD-10-CM | POA: Diagnosis not present

## 2022-08-30 DIAGNOSIS — E43 Unspecified severe protein-calorie malnutrition: Secondary | ICD-10-CM | POA: Insufficient documentation

## 2022-08-30 LAB — GLUCOSE, CAPILLARY
Glucose-Capillary: 115 mg/dL — ABNORMAL HIGH (ref 70–99)
Glucose-Capillary: 63 mg/dL — ABNORMAL LOW (ref 70–99)
Glucose-Capillary: 179 mg/dL — ABNORMAL HIGH (ref 70–99)
Glucose-Capillary: 316 mg/dL — ABNORMAL HIGH (ref 70–99)
Glucose-Capillary: 104 mg/dL — ABNORMAL HIGH (ref 70–99)

## 2022-08-30 NOTE — Progress Notes (Signed)
Inpatient Rehab Admissions Coordinator:    I do not have a CIR bed and Pt.'s sister has not confirmed support at d/c.  Clemens Catholic, [place], [place] Admissions Coordinator  [phone] ([place]) [phone] (office)

## 2022-08-30 NOTE — PMR Pre-admission (Signed)
PMR Admission Coordinator Pre-Admission Assessment  Patient: Melvin Taylor is an 57 y.o., male MRN: [id number] DOB: 1966/02/04 Height: '5\' 5"'$  (165.1 cm) Weight: 52.9 kg  Insurance Information HMO:     PPO:      PCP:      IPA:      80/20:      OTHER:  PRIMARY: Medicaid [place] [employer]      Policy#: [id number]       Subscriber: Pt.  CM Name:       Phone#:      Fax#:  Pre-Cert#:       Employer:  Benefits:  Phone #:      Name:  Eff. Date: effective 08/30/22 per passport onesource     Deduct:       Out of Pocket Max:       Life Max:  CIR:       SNF:  Outpatient:      Co-Pay:  Home Health:       Co-Pay:  DME:      Co-Pay:  Providers: in network  SECONDARY:       Policy#:      Phone#:   Development worker, community:       Phone#:   The Engineer, petroleum" for patients in Inpatient Rehabilitation Facilities with attached "Privacy Act [place] Records" was provided and verbally reviewed with: Patient  Emergency Contact Information Contact Information     Name Relation Home Work Mobile   [place] Sister   [phone]   gant,betty Elenor Legato   [phone]       Current Medical History  Patient Admitting Diagnosis: CVA History of Present Illness:  HPI: Melvin Taylor is a 57 year old right-handed male with history of bipolar disorder/schizophrenia, CAD/CABG, diabetes mellitus, hypertension, polysubstance abuse, OSA and will not use CPAP and seizure disorder//epilepsy maintained on Depakote as well as Lamictal as well as left MCA infarction with stenting maintained on Brilinta as well as Plavix receiving inpatient rehab services 12/21/2020 - 12/30/2020.  He was discharged to home ambulating 150 feet without assistive device contact-guard.  Per chart review patient lives alone.  1 level apartment with elevator.  Has a home health aide 53 hours a week to assist with making meals dressing bathing managing medications.  Sister does finances and his POA.  Presented  08/27/2022 from outside hospital after admission 1/20-27 with altered mental status.  He was noted to have persistent punctate focus of restricted diffusion in the left temporal medial lobe concerning for acute/subacute CVA on imaging at outside hospital.  MRI showed trace SDH that developed during the hospitalization 4 mm in the left occipital region.  He was transferred to [facility].  Cranial CT scan showed stable trace subdural hematoma along the posterior falx and posteriorly occipital lobe.  Stable remote infarcts of the left basal ganglia, anterior left frontal lobe and corona radiata but no acute CVA noted.  Stable left MCA stent.  Admission chemistries unremarkable except potassium 3.2, BUN 39, hemoglobin 9.2, valproic acid level 66.  Neurology follow-up patient currently remains on Brilinta as prior to admission but Plavix on hold.  EEG negative for seizure currently remains on Keppra which had been started at outside hospital.  His Depakote and Lamictal prior to admission are currently on hold.  Echocardiogram ejection fraction of 60 to 65% no wall motion abnormalities grade 1 diastolic dysfunction.  Hospital course COVID questionably incidental finding patient asymptomatic and completed antiviral/steroid course and maintained on airborne contact precautions.  Currently on dysphagia #2 nectar thick liquid diet.  Therapy evaluations completed due to patient decreased functional mobility altered mental status/dysphagia was admitted for a comprehensive rehab program.   Complete NIHSS TOTAL: 13  Patient's medical record from [facility].  has been reviewed by the rehabilitation admission coordinator and physician.  Past Medical History  Past Medical History:  Diagnosis Date   Bipolar 1 disorder ([place])    depression/anxiety   Bone spur    left heel   Cervical radiculopathy    Chronic back pain    Chronic chest wall pain    Chronic neck pain    Coronary artery disease     a. s/p CABG in 03/2016 with LIMA-LAD, SVG-D1, SVG-RCA, and Seq SVG-mid and distal OM   Diabetes mellitus    Type II   Hallucinations    "long history of them"   Headache(784.0)    History of gout    HTN (hypertension)    Insomnia    Neuropathy    Pain management    Polysubstance abuse ([place])    Right leg pain    chronic   Schizophrenia ([place])    Seizures ([place])    last sz between July 5-9th, 2016; epilepsy   Sleep apnea    Stroke [facility])    Transfusion of blood product refused for religious reason     Has the patient had major surgery during 100 days prior to admission? No  Family History   family history includes Arthritis in an other family member; Asthma in an other family member; Diabetes in his father and another family member; Hypertension in his father; Sleep apnea in his father; Stroke in his sister.  Current Medications  Current Facility-Administered Medications:    0.9 %  sodium chloride infusion, , Intravenous, Continuous, Karmen Bongo, MD, Last Rate: 50 mL/hr at 08/30/22 0938, Started During Downtime at 08/30/22 3818   [DISCONTINUED] acetaminophen (TYLENOL) tablet 650 mg, 650 mg, Oral, Q4H PRN **OR** acetaminophen (TYLENOL) 160 MG/5ML solution 650 mg, 650 mg, Per Tube, Q4H PRN **OR** acetaminophen (TYLENOL) suppository 650 mg, 650 mg, Rectal, Q4H PRN, Karmen Bongo, MD   albuterol (PROVENTIL) (2.5 MG/3ML) 0.083% nebulizer solution 3 mL, 3 mL, Inhalation, Q4H PRN, Karmen Bongo, MD   atorvastatin (LIPITOR) tablet 80 mg, 80 mg, Oral, Ivery Quale, MD, 80 mg at 08/29/22 2100   Chlorhexidine Gluconate Cloth 2 % PADS 6 each, 6 each, Topical, Daily, Karmen Bongo, MD, 6 each at 08/30/22 0919   cycloSPORINE (RESTASIS) 0.05 % ophthalmic emulsion 1 drop, 1 drop, Both Eyes, BID, Karmen Bongo, MD, 1 drop at 08/30/22 0920   escitalopram (LEXAPRO) tablet 20 mg, 20 mg, Oral, Daily, Karmen Bongo, MD, 20 mg at 08/30/22 0918   feeding supplement (NEPRO CARB  STEADY) liquid 237 mL, 237 mL, Oral, TID WC, Little Ishikawa, MD, 237 mL at 08/30/22 1206   insulin aspart (novoLOG) injection 0-15 Units, 0-15 Units, Subcutaneous, TID WC, Karmen Bongo, MD, 3 Units at 08/30/22 1207   insulin aspart (novoLOG) injection 0-5 Units, 0-5 Units, Subcutaneous, QHS, Karmen Bongo, MD, 3 Units at 08/29/22 2209   isosorbide mononitrate (IMDUR) 24 hr tablet 30 mg, 30 mg, Oral, Daily, Karmen Bongo, MD, 30 mg at 08/30/22 2993   levETIRAcetam (KEPPRA) IVPB 1000 mg/100 mL premix, 1,000 mg, Intravenous, Q12H, Karmen Bongo, MD, Last Rate: 400 mL/hr at 08/30/22 0316, 1,000 mg at 08/30/22 0316   multivitamin with minerals tablet 1 tablet, 1 tablet, Oral, Daily, Little Ishikawa,  MD, 1 tablet at 08/30/22 0917   sodium chloride flush (NS) 0.9 % injection 10-40 mL, 10-40 mL, Intracatheter, Q12H, Karmen Bongo, MD, 10 mL at 08/30/22 0920   sodium chloride flush (NS) 0.9 % injection 10-40 mL, 10-40 mL, Intracatheter, PRN, Karmen Bongo, MD   ticagrelor [facility]) tablet 90 mg, 90 mg, Oral, BID, Kerney Elbe, MD, 90 mg at 08/30/22 7342  Patients Current Diet:  Diet Order             DIET DYS 2 Room service appropriate? No; Fluid consistency: Nectar Thick  Diet effective now                   Precautions / Restrictions Precautions Precautions: Fall Precaution Comments: R hemiplegia Restrictions Weight Bearing Restrictions: No   Has the patient had 2 or more falls or a fall with injury in the past year? Yes  Prior Activity Level Limited Community (1-2x/wk): pt went out for appts  Prior Functional Level Self Care: Did the patient need help bathing, dressing, using the toilet or eating? Needed some help  Indoor Mobility: Did the patient need assistance with walking from room to room (with or without device)? Needed some help  Stairs: Did the patient need assistance with internal or external stairs (with or without device)? Needed some  help  Functional Cognition: Did the patient need help planning regular tasks such as shopping or remembering to take medications? Dependent  Patient Information Are you of Hispanic, Latino/a,or Spanish origin?: A. No, not of Hispanic, Latino/a, or Spanish origin What is your race?: B. Black or African American Do you need or want an interpreter to communicate with a doctor or health care staff?: 0. No  Patient's Response To:  Health Literacy and Transportation Is the patient able to respond to health literacy and transportation needs?: Yes Health Literacy - How often do you need to have someone help you when you read instructions, pamphlets, or other written material from your doctor or pharmacy?: Often In the past 12 months, has lack of transportation kept you from medical appointments or from getting medications?: No In the past 12 months, has lack of transportation kept you from meetings, work, or from getting things needed for daily living?: No  Home Assistive Devices / [place] Devices/Equipment: Bedside commode/3-in-1, Environmental consultant (specify type), Shower chair with back, Cane (specify quad or straight), CBG Meter, Blood pressure cuff Home Equipment: Conservation officer, nature (2 wheels), Cane - single point, Shower seat, Hand held shower head  Prior Device Use: Indicate devices/aids used by the patient prior to current illness, exacerbation or injury? Walker  Current Functional Level Cognition  Overall Cognitive Status: Impaired/Different from baseline Current Attention Level: Sustained Orientation Level: Oriented to person, Disoriented to place, Disoriented to time, Disoriented to situation Following Commands: Follows one step commands with increased time Safety/Judgement: Decreased awareness of safety, Decreased awareness of deficits General Comments: per sister, [name], pt normally able to hold a conversation, pt now with delayed response time and one word answers    Extremity  Assessment (includes Sensation/Coordination)  Upper Extremity Assessment: RUE deficits/detail RUE Deficits / Details: contracted, minimal PORM of elbow and shoulder. wrist and hand with full PROM. minimal activity movement. grimiacing to touch RUE Sensation: decreased light touch, decreased proprioception RUE Coordination: decreased fine motor, decreased gross motor  Lower Extremity Assessment: Defer to PT evaluation RLE Deficits / Details: increased tone, grossly 2+/5 RLE Coordination: decreased gross motor    ADLs  Overall ADL's : Needs assistance/impaired  Eating/Feeding: Moderate assistance, Sitting Grooming: Moderate assistance, Sitting Upper Body Bathing: Maximal assistance, Sitting Lower Body Bathing: Maximal assistance, +2 for safety/equipment, +2 for physical assistance, Sit to/from stand Upper Body Dressing : Maximal assistance, Sitting Lower Body Dressing: Maximal assistance, +2 for safety/equipment, +2 for physical assistance, Sit to/from stand Toilet Transfer: +2 for physical assistance, +2 for safety/equipment, Maximal assistance Toileting- Clothing Manipulation and Hygiene: Maximal assistance, +2 for physical assistance, +2 for safety/equipment, Sit to/from stand Functional mobility during ADLs: +2 for physical assistance, +2 for safety/equipment, Maximal assistance General ADL Comments: limited by L hemibody impairments, cog, communication    Mobility  Overal bed mobility: Needs Assistance Bed Mobility: Supine to Sit Supine to sit: Mod assist, +2 for safety/equipment General bed mobility comments: Pt initiating well, exiting towards left side of bed, assist at trunk to bring upright    Transfers  Overall transfer level: Needs assistance Equipment used: 2 person hand held assist (2 person face to face transfer with gait belt) Transfers: Sit to/from Stand, Bed to chair/wheelchair/BSC Sit to Stand: Mod assist, +2 safety/equipment Bed to/from chair/wheelchair/BSC transfer  type:: Stand pivot Stand pivot transfers: Mod assist, +2 safety/equipment Step pivot transfers: Max assist, +2 physical assistance General transfer comment: Cues for pushing off with left hand, modA to power up and pivot towards left to chair.    Ambulation / Gait / Stairs / Wheelchair Mobility  Ambulation/Gait Ambulation/Gait assistance: Mod assist, +2 safety/equipment, +2 physical assistance Gait Distance (Feet): 3 Feet Assistive device: 2 person hand held assist Gait Pattern/deviations: Step-to pattern, Decreased stride length, Shuffle, Decreased weight shift to right General Gait Details: Increased R foot external rotation, decreased bilateral foot clearance, modA for standing balance and close chair follow Gait velocity: decreased Gait velocity interpretation: <1.31 ft/sec, indicative of household ambulator    Posture / Balance Dynamic Sitting Balance Sitting balance - Comments: pt with posterior R lateral lean Balance Overall balance assessment: Needs assistance Sitting-balance support: Feet supported, Single extremity supported Sitting balance-Leahy Scale: Fair Sitting balance - Comments: pt with posterior R lateral lean Standing balance support: Single extremity supported, During functional activity Standing balance-Leahy Scale: Poor Standing balance comment: reliant on external support    Special needs/care consideration Special service needs none    Previous Home Environment (from acute therapy documentation) Living Arrangements: Other (Comment)  Lives With: Family Available Help at Discharge: Family, Available 24 hours/day Type of Home: Apartment Home Layout: Other (Comment) (2nd floor with elevator) Home Access: Elevator Bathroom Shower/Tub: Chiropodist: Standard Bathroom Accessibility: Yes How Accessible: Accessible via walker, Accessible via wheelchair Home Care Services: Yes Type of Home Care Services: Housekeeping, [place]  (if known): [place] ([phone]) Additional Comments: info provided by sister  Discharge Living Setting Plans for Discharge Living Setting: Patient's home Type of Home at Discharge: Apartment Discharge Home Layout: Two level, Other (Comment) Alternate Level Stairs-Rails: None Discharge Home Access: Elevator Discharge Bathroom Shower/Tub: Tub/shower unit Discharge Bathroom Toilet: Standard Discharge Bathroom Accessibility: Yes How Accessible: Accessible via walker, Accessible via wheelchair Does the patient have any problems obtaining your medications?: No  Social/Family/Support Systems Patient Roles: Other (Comment) Contact Information: Uncle Debroah Loop states he will spend nights with pt. and can do min-mod A. Sister [name] states that Pt. will have medicaid provided caregiver for 50 hrs a week (days) Anticipated Caregiver: [phone] Ability/Limitations of Caregiver: Can do Min A Caregiver Availability: 24/7 Discharge Plan Discussed with Primary Caregiver: Yes Is Caregiver In Agreement with Plan?: Yes Does Caregiver/Family  have Issues with Lodging/Transportation while Pt is in Rehab?: No  Goals Patient/Family Goal for Rehab: PT/OT/SLP Supervision to Min A Expected length of stay: 16-18 days Pt/Family Agrees to Admission and willing to participate: Yes Program Orientation Provided & Reviewed with Pt/Caregiver Including Roles  & Responsibilities: Yes  Decrease burden of Care through IP rehab admission: not anticipated   Possible need for SNF placement upon discharge: none   Patient Condition: I have reviewed medical records from [facility] , spoken with CM, and patient and family member. I met with patient at the bedside for inpatient rehabilitation assessment.  Patient will benefit from ongoing PT, OT, and SLP, can actively participate in 3 hours of therapy a day 5 days of the week, and can make measurable gains during the admission.  Patient will also  benefit from the coordinated team approach during an Inpatient Acute Rehabilitation admission.  The patient will receive intensive therapy as well as Rehabilitation physician, nursing, social worker, and care management interventions.  Due to safety, skin/wound care, disease management, medication administration, pain management, and patient education the patient requires 24 hour a day rehabilitation nursing.  The patient is currently mod A  with mobility and basic ADLs.  Discharge setting and therapy post discharge at home with home health is anticipated.  Patient has agreed to participate in the Acute Inpatient Rehabilitation Program and will admit today.  Preadmission Screen Completed By:  Genella Mech, 08/30/2022 2:51 PM ______________________________________________________________________   Discussed status with Dr. Curlene Dolphin  on 08/31/22 at 51 and received approval for admission today.  Admission Coordinator:  Genella Mech, CCC-SLP, time 1010/Date 08/31/22   Assessment/Plan: Diagnosis: acute metabolic encephalopathy  Does the need for close, 24 hr/day Medical supervision in concert with the patient's rehab needs make it unreasonable for this patient to be served in a less intensive setting? Yes Co-Morbidities requiring supervision/potential complications: DM, HLD, CAD, COVID-19, HTN, seizure disorder Due to bladder management, bowel management, safety, skin/wound care, disease management, medication administration, pain management, and patient education, does the patient require 24 hr/day rehab nursing? Yes Does the patient require coordinated care of a physician, rehab nurse, PT, OT, and SLP to address physical and functional deficits in the context of the above medical diagnosis(es)? Yes Addressing deficits in the following areas: balance, endurance, locomotion, strength, transferring, bowel/bladder control, bathing, dressing, feeding, grooming, toileting, cognition, speech, language,  swallowing, and psychosocial support Can the patient actively participate in an intensive therapy program of at least 3 hrs of therapy 5 days a week? Yes The potential for patient to make measurable gains while on inpatient rehab is good Anticipated functional outcomes upon discharge from inpatient rehab: supervision and min assist PT, supervision and min assist OT, supervision and min assist SLP Estimated rehab length of stay to reach the above functional goals is: 16-18 Anticipated discharge destination: Home 10. Overall Rehab/Functional Prognosis: good   MD Signature: Jennye Boroughs

## 2022-08-30 NOTE — Progress Notes (Signed)
Inpatient Rehab Admissions Coordinator:    I spoke with pt.'s sister [name], regarding disposition plan. She states that she has talked with case worker and pt. Will have 50+ hours of caregiver assistance at d/c. Additionally, Pt.'s aunt and uncle will assist evenings and nights. I spoke with pt.'s uncle, Debroah Loop, Who confirmed this and states that he can do up to min-mod A. I will follow for potential admit once medically ready.   Clemens Catholic, [place], [place] Admissions Coordinator  [phone] ([place]) [phone] (office)

## 2022-08-30 NOTE — Progress Notes (Signed)
Modified Barium Swallow Progress Note  Patient Details  Name: Melvin Taylor MRN: [id number] Date of Birth: 1966/06/08  Today's Date: 08/30/2022  Modified Barium Swallow completed.  Full report located under Chart Review in the Imaging Section.  Brief recommendations include the following:  Clinical Impression  Patient presents with a mild oral and a mild-moderate pharyngeal phase dysphagia as per this MBS. During oral phase, patient exhibited weak lingual manipulation and mastication leading to delays in bolus formation and transit. During pharyngeal phase, patient with vallecular residuals with all consistencies as well as pyriform sinus residuals with honey thick, nectar thick and thin liquids. Residuals started to clear with subsequent swallows. No penetration or aspiration observed with teaspoon sips of nectar, thin or honey thick liquids. During consecutive cup sips with thin liquids and nectar thick liquids, patient with reduced epiglottic inversion leading to penetration above vocal cords. In addition, airway closure was adequate with nectar thick liquids even during concecutive sips. Aspiration observed during the swallow with consecutive sips of thin liquids and although it was trace to mild in amount, patient did not sense. (silent aspiration). SLP recommending to continue with Dys 2 solids, nectar thick liquids at this time and will trial thin liquids at bedside.   Swallow Evaluation Recommendations       SLP Diet Recommendations: Dysphagia 2 (Fine chop) solids;Nectar thick liquid   Liquid Administration via: Cup;Straw   Medication Administration: Whole meds with puree   Supervision: Patient able to self feed;Full supervision/cueing for compensatory strategies   Compensations: Slow rate;Small sips/bites;Minimize environmental distractions;Lingual sweep for clearance of pocketing   Postural Changes: Seated upright at 90 degrees   Oral Care Recommendations: Oral care BID    Other Recommendations: Order thickener from [place], MA, CCC-SLP Speech Therapy

## 2022-08-30 NOTE — Progress Notes (Signed)
PROGRESS NOTE    Melvin Taylor  KTG:[id number] DOB: May 01, 1966 DOA: 08/27/2022 PCP: [name], FNP   Brief Narrative:  Melvin Taylor is a 57 y.o. male with medical history significant of bipolar d/o vs/ schizophrenia, chronic chest wall/neck/back pain, CAD s/p CABG, DM, HTN, prior history of polysubstance abuse, OSA, and seizure d/o presenting with AMS.  He was admitted to [facility] from 1/20-27 with newly diagnosed covid(completed treatment) and AMS, minimally improving during hospitalization.  Punctate CVA on imaging at that facility with subdural hematoma developing during that hospitalization, 4 mm in the left occipital region.  He continues on Keppra for seizure prophylaxis.  At baseline he is able to answer direct questions per family.  Otherwise lives alone with caretakers.  Given new findings of subdural hematoma he was transferred to our facility for neurosurgery evaluation on 08/27/2022.  Patient medically stable for discharge - awaiting safe disposition. CIR is evaluating for admission.  Assessment & Plan:   Principal Problem:   Acute CVA (cerebrovascular accident) ([place]) Active Problems:   DM type 2 causing vascular disease ([place])   Essential hypertension, benign   Mixed hyperlipidemia   CAD (coronary artery disease)   Subdural hematoma (HCC)   Seizure disorder (HCC)   OSA (obstructive sleep apnea)   Acute metabolic encephalopathy   Protein-calorie malnutrition, severe   Acute metabolic encephalopathy, multifactorial, resolved - Recent CVA, SDH, recent covid infection and high dose steroids with hospitalization all likely contributing to mental status changes - Neurosurgery signed off given small size and stable subdural hematoma - Neurology following, repeat CT head shows stable hematoma - Continue Brilinta in the setting of recent stenting. - Patient otherwise appears to be back to baseline today able to answer simple direct questions which was his baseline per  signout.   SDH -Appears to have been spontaneous at [place] -Neurosurgery has signed off -Repeat CT head stable - resume Brilinta as above   COVID, questionably incidental finding, resolved -Positive for COVID on 1/20, asymptomatic other than mental status changes which are likely due to other confounding issues as above. -Completed antiviral/steroid course without improvement in symptoms -There is no indication to retest as covid test will remain positive well past acute infection phase (upwards of 90 days in some patients) -He would have completed quarantine as an outpatient with mild symptoms (vs asymptomatic) 08/25/22 given his lack of symptoms/admitted for reasons other than covid infection -This was initially thought to be the cause of his AMS but unlikely given above   HTN -Permissive hypertension window closing - transition towards normal BP per neuro -Currently borderline hypotensive/bradycardic - continue to hold home medications (amlodipine/HCTZ)   HLD -FLP unremarkable -Continue Lipitor 80 mg daily when able to take PO   DM -A1c was 5.0 on 1/21 -Hold glimepride -Will order moderate-scale SSI   CAD, history -s/p CABG -Hold hypertensive medications currently normotensive   Bipolar/schizophrenia  -Sister denies both diagnoses -h/o polysubstance abuse, in remission x 2 years (trigger finger surgery last year -> relapse for a brief period of time)   History of seizures -None since stent placement -Depakote and Lamictal are on hold for now -Depakote level within normal limits -Currently on IV Keppra, transition per neurology as above   OSA -Does not wear CPAP, no machine at this time  DVT prophylaxis: None given subdural hematoma as above Code Status: Full Family Communication: None present  Status is: Inpatient  Dispo: The patient is from: Home  Anticipated d/c is to: CIR              Anticipated d/c date is: 24 to 48 hours pending EEG and  possible antiepileptic medication titration              Patient currently not medically stable for discharge  Consultants:  Neurosurgery, neurology  Procedures:  None  Antimicrobials:  None indicated, completed antiviral course at previous facility for COVID infection as above  Subjective: No acute issues or events overnight, appears to be back to baseline mental status and interview this morning, review of systems negative other than back pain which she states is chronic.  Objective: Vitals:   08/29/22 1900 08/29/22 2300 08/30/22 0300 08/30/22 0804  BP: 126/70 (!) 124/94 120/81 115/63  Pulse: 87 71 73 (!) 52  Resp: '18 18 18 17  '$ Temp: 98.4 F (36.9 C) 98.4 F (36.9 C) 98.5 F (36.9 C) 98.6 F (37 C)  TempSrc: Oral Axillary Axillary Oral  SpO2:    99%  Weight:      Height:        Intake/Output Summary (Last 24 hours) at 08/30/2022 1135 Last data filed at 08/30/2022 0500 Gross per 24 hour  Intake 1598.48 ml  Output 550 ml  Net 1048.48 ml    Filed Weights   08/27/22 1033 08/27/22 1620  Weight: 52.9 kg 52.9 kg    Examination:  General:  Pleasantly resting in bed, No acute distress.  Answer simple questions with 1-2 word answers HEENT:  Normocephalic atraumatic.  Sclerae nonicteric, noninjected.  Extraocular movements intact bilaterally. Neck:  Without mass or deformity.  Trachea is midline. Lungs:  Clear to auscultate bilaterally without rhonchi, wheeze, or rales. Heart:  Regular rate and rhythm.  Without murmurs, rubs, or gallops. Abdomen:  Soft, nontender, nondistended.  Without guarding or rebound. Extremities: Without cyanosis, clubbing, edema, or obvious deformity.  Data Reviewed: I have personally reviewed following labs and imaging studies  CBC: Recent Labs  Lab 08/29/22 0346  WBC 6.7  HGB 10.6*  HCT 30.2*  MCV 89.3  PLT 536    Basic Metabolic Panel: Recent Labs  Lab 08/29/22 0346  NA 144  K 3.3*  CL 110  CO2 25  GLUCOSE 86  BUN 18   CREATININE 0.98  CALCIUM 8.4*    GFR: Estimated Creatinine Clearance: 61.5 mL/min (by C-G formula based on SCr of 0.98 mg/dL). Liver Function Tests: No results for input(s): "AST", "ALT", "ALKPHOS", "BILITOT", "PROT", "ALBUMIN" in the last 168 hours. No results for input(s): "LIPASE", "AMYLASE" in the last 168 hours. No results for input(s): "AMMONIA" in the last 168 hours. Coagulation Profile: No results for input(s): "INR", "PROTIME" in the last 168 hours. Cardiac Enzymes: No results for input(s): "CKTOTAL", "CKMB", "CKMBINDEX", "TROPONINI" in the last 168 hours. BNP (last 3 results) No results for input(s): "PROBNP" in the last 8760 hours. HbA1C: No results for input(s): "HGBA1C" in the last 72 hours. CBG: Recent Labs  Lab 08/29/22 1202 08/29/22 1548 08/29/22 2157 08/30/22 0539 08/30/22 0607  GLUCAP 109* 230* 264* 63* 115*    Lipid Profile: Recent Labs    08/28/22 0329  CHOL 102  HDL 42  LDLCALC 46  TRIG 71  CHOLHDL 2.4    Thyroid Function Tests: No results for input(s): "TSH", "T4TOTAL", "FREET4", "T3FREE", "THYROIDAB" in the last 72 hours. Anemia Panel: No results for input(s): "VITAMINB12", "FOLATE", "FERRITIN", "TIBC", "IRON", "RETICCTPCT" in the last 72 hours. Sepsis Labs: No results for input(s): "PROCALCITON", "LATICACIDVEN" in  the last 168 hours.  Recent Results (from the past 240 hour(s))  Resp panel by RT-PCR (RSV, Flu A&B, Covid) Anterior Nasal Swab     Status: Abnormal   Collection Time: 08/27/22  3:03 PM   Specimen: Anterior Nasal Swab  Result Value Ref Range Status   SARS Coronavirus 2 by RT PCR POSITIVE (A) NEGATIVE Final    Comment: (NOTE) SARS-CoV-2 target nucleic acids are DETECTED.  The SARS-CoV-2 RNA is generally detectable in upper respiratory specimens during the acute phase of infection. Positive results are indicative of the presence of the identified virus, but do not rule out bacterial infection or co-infection with other  pathogens not detected by the test. Clinical correlation with patient history and other diagnostic information is necessary to determine patient infection status. The expected result is Negative.  Fact Sheet for Patients: EntrepreneurPulse.com.au  Fact Sheet for Healthcare Providers: IncredibleEmployment.be  This test is not yet approved or cleared by the [place] FDA and  has been authorized for detection and/or diagnosis of SARS-CoV-2 by FDA under an Emergency Use Authorization (EUA).  This EUA will remain in effect (meaning this test can be used) for the duration of  the COVID-19 declaration under Section 564(b)(1) of the A ct, 21 U.S.C. section 360bbb-3(b)(1), unless the authorization is terminated or revoked sooner.     Influenza A by PCR NEGATIVE NEGATIVE Final   Influenza B by PCR NEGATIVE NEGATIVE Final    Comment: (NOTE) The Xpert Xpress SARS-CoV-2/FLU/RSV plus assay is intended as an aid in the diagnosis of influenza from Nasopharyngeal swab specimens and should not be used as a sole basis for treatment. Nasal washings and aspirates are unacceptable for Xpert Xpress SARS-CoV-2/FLU/RSV testing.  Fact Sheet for Patients: EntrepreneurPulse.com.au  Fact Sheet for Healthcare Providers: IncredibleEmployment.be  This test is not yet approved or cleared by the [place] FDA and has been authorized for detection and/or diagnosis of SARS-CoV-2 by FDA under an Emergency Use Authorization (EUA). This EUA will remain in effect (meaning this test can be used) for the duration of the COVID-19 declaration under Section 564(b)(1) of the Act, 21 U.S.C. section 360bbb-3(b)(1), unless the authorization is terminated or revoked.     Resp Syncytial Virus by PCR NEGATIVE NEGATIVE Final    Comment: (NOTE) Fact Sheet for Patients: EntrepreneurPulse.com.au  Fact Sheet for Healthcare  Providers: IncredibleEmployment.be  This test is not yet approved or cleared by the [place] FDA and has been authorized for detection and/or diagnosis of SARS-CoV-2 by FDA under an Emergency Use Authorization (EUA). This EUA will remain in effect (meaning this test can be used) for the duration of the COVID-19 declaration under Section 564(b)(1) of the Act, 21 U.S.C. section 360bbb-3(b)(1), unless the authorization is terminated or revoked.  Performed at [facility], [place] [address], [place], [place] [address]          Radiology Studies: EEG adult  Result Date: Sep 09, 2022 Lora Havens, MD     09-Sep-2022 10:26 AM Patient Name: Melvin Taylor MRN: [id number] Epilepsy Attending: Lora Havens Referring Physician/Provider: Katy Apo, NP Date: 08/28/2022 Duration: 21.18 mins Patient history: 57yo M with ams. EEG to evaluate for seizure Level of alertness: Awake AEDs during EEG study: None Technical aspects: This EEG study was done with scalp electrodes positioned according to the 10-20 International system of electrode placement. Electrical activity was reviewed with band pass filter of 1-'70Hz'$ , sensitivity of 7 uV/mm, display speed of 71m/sec with a '60Hz'$   notched filter applied as appropriate. EEG data were recorded continuously and digitally stored.  Video monitoring was available and reviewed as appropriate. Description: EEG showed continuous generalized 3 to 6 Hz theta-delta slowing. Hyperventilation and photic stimulation were not performed.   ABNORMALITY - Continuous slow, generalized IMPRESSION: This study is suggestive of moderate to severe diffuse encephalopathy, nonspecific etiology. No seizures or epileptiform discharges were seen throughout the recording. [place]   CT HEAD WO CONTRAST (5MM)  Result Date: 08/28/2022 CLINICAL DATA:  Stroke, hemorrhagic EXAM: CT HEAD WITHOUT CONTRAST TECHNIQUE: Contiguous axial images were  obtained from the base of the skull through the vertex without intravenous contrast. RADIATION DOSE REDUCTION: This exam was performed according to the departmental dose-optimization program which includes automated exposure control, adjustment of the mA and/or kV according to patient size and/or use of iterative reconstruction technique. COMPARISON:  CTA head and neck 08/26/2022. MR head without and with contrast 08/25/2022 at [place]: Brain: Trace subdural hematoma along the posterior falx and posterior the occipital lobe is stable. No new hemorrhage is present. Remote infarcts of the left basal ganglia, anterior left frontal lobe and corona radiata are stable. No acute infarct is present. Ex vacuo dilation of the left lateral ventricle is present. The brainstem and cerebellum are within normal limits. Midline structures are within normal limits. Vascular: Left MCA stent is stable. Atherosclerotic calcifications are present within the cavernous internal carotid arteries. No other hyperdense vessel is present. Skull: Calvarium is intact. No focal lytic or blastic lesions are present. No significant extracranial soft tissue lesion is present. Sinuses/Orbits: A left mastoid effusion is present. The paranasal sinuses and mastoid air cells are otherwise clear. The globes and orbits are within normal limits. IMPRESSION: 1. Stable trace subdural hematoma along the posterior falx and posterior the occipital lobe. 2. No new hemorrhage. 3. 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 4. Stable left MCA stent. 5. Left mastoid effusion. The above was relayed via text pager to Dr. Kerney Elbe on 08/28/2022 at 13:31 . Electronically Signed   By: San Morelle M.D.   On: 08/28/2022 13:32   ECHOCARDIOGRAM COMPLETE  Result Date: 08/28/2022    ECHOCARDIOGRAM REPORT   Patient Name:   Melvin Taylor Date of Exam: 08/28/2022 Medical Rec #:  [id number]          Height:       65.0 in Accession #:    [id number]        Weight:       116.6 lb Date of Birth:  [birth year]          BSA:          1.573 m Patient Age:    70 years          BP:           130/84 mmHg Patient Gender: M                 HR:           56 bpm. Exam Location:  Inpatient Procedure: 2D Echo, Cardiac Doppler and Color Doppler Indications:    I63.9 Stroke  History:        Patient has prior history of Echocardiogram examinations, most                 recent 12/17/2020. Stroke; Risk Factors:Sleep Apnea, Diabetes,  Dyslipidemia and Former Smoker.  Sonographer:    Wilkie Aye RVT RCS Referring Phys: [id number] JENNIFER YATES  Sonographer Comments: Image acquisition challenging due to uncooperative patient and Patient unable to turn; images off axis; patient kept pushing Techs hand away, exam ended. IMPRESSIONS  1. Left ventricular ejection fraction, by estimation, is 60 to 65%. The left ventricle has normal function. The left ventricle has no regional wall motion abnormalities. There is mild left ventricular hypertrophy. Left ventricular diastolic parameters are consistent with Grade I diastolic dysfunction (impaired relaxation).  2. Right ventricular systolic function is normal. The right ventricular size is normal.  3. The mitral valve is normal in structure. Mild mitral valve regurgitation. No evidence of mitral stenosis.  4. The aortic valve is tricuspid. Aortic valve regurgitation is not visualized. Aortic valve sclerosis is present, with no evidence of aortic valve stenosis.  5. The inferior vena cava is normal in size with greater than 50% respiratory variability, suggesting right atrial pressure of 3 mmHg. Comparison(s): EF 65-70%. FINDINGS  Left Ventricle: Left ventricular ejection fraction, by estimation, is 60 to 65%. The left ventricle has normal function. The left ventricle has no regional wall motion abnormalities. The left ventricular internal cavity size was normal in size. There is  mild  left ventricular hypertrophy. Left ventricular diastolic parameters are consistent with Grade I diastolic dysfunction (impaired relaxation). Right Ventricle: The right ventricular size is normal. Right ventricular systolic function is normal. Left Atrium: Left atrial size was normal in size. Right Atrium: Right atrial size was normal in size. Pericardium: There is no evidence of pericardial effusion. Mitral Valve: The mitral valve is normal in structure. Mild mitral valve regurgitation. No evidence of mitral valve stenosis. Tricuspid Valve: The tricuspid valve is normal in structure. Tricuspid valve regurgitation is mild . No evidence of tricuspid stenosis. Aortic Valve: The aortic valve is tricuspid. Aortic valve regurgitation is not visualized. Aortic valve sclerosis is present, with no evidence of aortic valve stenosis. Aortic valve mean gradient measures 1.0 mmHg. Aortic valve peak gradient measures 2.6  mmHg. Aortic valve area, by VTI measures 4.70 cm. Pulmonic Valve: The pulmonic valve was normal in structure. Pulmonic valve regurgitation is trivial. No evidence of pulmonic stenosis. Aorta: The aortic root is normal in size and structure. Venous: The inferior vena cava is normal in size with greater than 50% respiratory variability, suggesting right atrial pressure of 3 mmHg. IAS/Shunts: The interatrial septum is aneurysmal. No atrial level shunt detected by color flow Doppler.  LEFT VENTRICLE PLAX 2D LVIDd:         3.70 cm   Diastology LVIDs:         2.80 cm   LV e' medial:   6.76 cm/s LV PW:         1.35 cm   LV E/e' medial: 9.0 LV IVS:        1.15 cm LVOT diam:     2.30 cm LV SV:         64 LV SV Index:   41 LVOT Area:     4.15 cm  RIGHT VENTRICLE RV S prime:     9.41 cm/s LEFT ATRIUM         Index LA diam:    2.90 cm 1.84 cm/m  AORTIC VALVE                    PULMONIC VALVE AV Area (Vmax):    4.08 cm     PV Vmax:  0.75 m/s AV Area (Vmean):   3.41 cm     PV Peak grad:     2.2 mmHg AV Area  (VTI):     4.70 cm     PR End Diast Vel: 4.33 msec AV Vmax:           80.50 cm/s AV Vmean:          55.100 cm/s AV VTI:            0.137 m AV Peak Grad:      2.6 mmHg AV Mean Grad:      1.0 mmHg LVOT Vmax:         79.00 cm/s LVOT Vmean:        45.200 cm/s LVOT VTI:          0.155 m LVOT/AV VTI ratio: 1.13  AORTA Ao Root diam: 3.70 cm MITRAL VALVE               TRICUSPID VALVE MV Area (PHT): 2.26 cm    TR Peak grad:   26.8 mmHg MV Decel Time: 336 msec    TR Vmax:        259.00 cm/s MV E velocity: 60.60 cm/s MV A velocity: 87.30 cm/s  SHUNTS MV E/A ratio:  0.69        Systemic VTI:  0.16 m                            Systemic Diam: 2.30 cm Kirk Ruths MD Electronically signed by Kirk Ruths MD Signature Date/Time: 08/28/2022/12:15:17 PM    Final     Scheduled Meds:  atorvastatin  80 mg Oral QHS   Chlorhexidine Gluconate Cloth  6 each Topical Daily   cycloSPORINE  1 drop Both Eyes BID   escitalopram  20 mg Oral Daily   feeding supplement (NEPRO CARB STEADY)  237 mL Oral TID WC   insulin aspart  0-15 Units Subcutaneous TID WC   insulin aspart  0-5 Units Subcutaneous QHS   isosorbide mononitrate  30 mg Oral Daily   multivitamin with minerals  1 tablet Oral Daily   sodium chloride flush  10-40 mL Intracatheter Q12H   ticagrelor  90 mg Oral BID   Continuous Infusions:  sodium chloride 50 mL/hr at 08/30/22 0938   levETIRAcetam 1,000 mg (08/30/22 0316)     LOS: 3 days   Time spent: 70mn  Porfirio C Shandricka Monroy, DO [facility]  If 7PM-7AM, please contact night-coverage [email or web address]  08/30/2022, 11:35 AM

## 2022-08-30 NOTE — Progress Notes (Signed)
Speech Language Pathology Treatment: Dysphagia  Patient Details Name: Melvin Taylor MRN: [id number] DOB: 07/09/1966 Today's Date: 08/30/2022 Time: 1715-1730 SLP Time Calculation (min) (ACUTE ONLY): 15 min  Assessment / Plan / Recommendation Clinical Impression  Patient seen by SLP for skilled treatment focused on dysphagia goals. He was awake, in bed with mask still on. He was able to verbalize what he wanted when asked by SLP and given extra time, saying, "two orange juices". SLP thickened orange juice to nectar thick. Patient able to feed self via cup and via straw, however unless cued, he took large sips resulting in mildly delayed cough response. This corresponds to MBS findings from today of penetration with consecutive cup sips of nectar thick and thin liquids. SLP posted swallow precautions sign and will continue to follow for ability to advance and toleration of diet.   HPI HPI: 57 y.o. male with medical history significant of bipolar d/o vs/ schizophrenia, chronic chest wall/neck/back pain, CAD s/p CABG, DM, HTN, polysubstance abuse, OSA, and seizure d/o presenting with AMS.  He was admitted to [facility] from 1/20-27 with AMS, minimally improving during hospitalization.  He was noted to have a persistent punctate focus of restricted diffusion in the L medial temporal lobe, concerning for acute/subacute CVA.  MRI/CT with SDH that developed during the hospitalization, 4 mm in L occipital region.  He has been on IV Keppra throughout the hospitalization without apparent seizure activity.  He is able to answer very limited questions, none about orientation.  He does follow some commands      SLP Plan  Continue with current plan of care      Recommendations for follow up therapy are one component of a multi-disciplinary discharge planning process, led by the attending physician.  Recommendations may be updated based on patient status, additional functional criteria and insurance authorization.     Recommendations  Diet recommendations: Dysphagia 2 (fine chop);Nectar-thick liquid Liquids provided via: Cup;Straw Medication Administration: Whole meds with puree Supervision: Staff to assist with self feeding;Full supervision/cueing for compensatory strategies Compensations: Slow rate;Small sips/bites;Minimize environmental distractions;Lingual sweep for clearance of pocketing Postural Changes and/or Swallow Maneuvers: Seated upright 90 degrees                Oral Care Recommendations: Oral care BID Follow Up Recommendations: Acute inpatient rehab (3hours/day) Assistance recommended at discharge: Frequent or constant Supervision/Assistance SLP Visit Diagnosis: Dysphagia, oropharyngeal phase (R13.12) Plan: Continue with current plan of care         Sonia Baller, MA, CCC-SLP Speech Therapy

## 2022-08-30 NOTE — Progress Notes (Addendum)
Physical Therapy Treatment Patient Details Name: Melvin Taylor MRN: [id number] DOB: Feb 28, 1966 Today's Date: 08/30/2022   History of Present Illness Melvin Taylor is a 57 y.o. male who was admitted to [facility] from 1/20-27 with AMS, minimally improving during hospitalization.  He was noted to have a persistent punctate focus of restricted diffusion in the L medial temporal lobe, concerning for acute/subacute CVA.  MRI/CT with SDH that developed during the hospitalization, 4 mm in L occipital region. PMH: bipolar d/o vs/ schizophrenia, chronic chest wall/neck/back pain, CAD s/p CABG, DM, HTN, polysubstance abuse, OSA, and seizure d/o    PT Comments    Pt progressing well towards his physical therapy goals and is motivated to participate. Session focused on PROM/stretching right extremities and transfer training. Pt with increased RLE extension tone, although is able to use functionally during session. Pt overall requiring min-mod assist. Performed multiple stands during session; displays decreased right foot clearance with transfers and stepping. Demonstrates good activity tolerance throughout. Continue to recommend AIR to address deficits, maximize functional mobility and decrease caregiver burden. Continue to recommend acute inpatient rehab (AIR) for post-acute therapy needs.     Recommendations for follow up therapy are one component of a multi-disciplinary discharge planning process, led by the attending physician.  Recommendations may be updated based on patient status, additional functional criteria and insurance authorization.  Follow Up Recommendations  Acute inpatient rehab (3hours/day)     Assistance Recommended at Discharge Frequent or constant Supervision/Assistance  Patient can return home with the following A lot of help with walking and/or transfers;A lot of help with bathing/dressing/bathroom;Direct supervision/assist for medications management;Direct supervision/assist for  financial management;Assist for transportation;Help with stairs or ramp for entrance;Assistance with cooking/housework   Equipment Recommendations   (TBD at next venue)    Recommendations for Other Services Rehab consult     Precautions / Restrictions Precautions Precautions: Fall Precaution Comments: R hemiplegia Restrictions Weight Bearing Restrictions: No     Mobility  Bed Mobility Overal bed mobility: Needs Assistance Bed Mobility: Supine to Sit     Supine to sit: Mod assist     General bed mobility comments: Pt initiating well, exiting towards left side of bed, assist at trunk to bring upright    Transfers Overall transfer level: Needs assistance Equipment used: 1 person hand held assist, Rolling walker (2 wheels) Transfers: Sit to/from Stand, Bed to chair/wheelchair/BSC Sit to Stand: Mod assist Stand pivot transfers: Min assist         General transfer comment: Cues for pushing off with left hand, modA to power up from edge of bed. On 2nd trial, pt holding onto RW with LUE in order to stabilize to provide peri care. On 3rd standing trial, pt pivoting to left with minA, decreased R foot clearance, cues for reaching back with LUE    Ambulation/Gait                   Stairs             Wheelchair Mobility    Modified Rankin (Stroke Patients Only) Modified Rankin (Stroke Patients Only) Pre-Morbid Rankin Score: Moderate disability Modified Rankin: Moderately severe disability     Balance Overall balance assessment: Needs assistance Sitting-balance support: Feet supported, Single extremity supported Sitting balance-Leahy Scale: Fair     Standing balance support: Single extremity supported, During functional activity Standing balance-Leahy Scale: Poor Standing balance comment: reliant on external support  Cognition Arousal/Alertness: Awake/alert Behavior During Therapy: Flat affect Overall Cognitive  Status: Impaired/Different from baseline Area of Impairment: Attention, Memory, Following commands, Safety/judgement, Awareness, Problem solving                   Current Attention Level: Sustained Memory: Decreased short-term memory Following Commands: Follows one step commands with increased time Safety/Judgement: Decreased awareness of safety, Decreased awareness of deficits Awareness: Intellectual Problem Solving: Slow processing, Difficulty sequencing, Requires verbal cues, Requires tactile cues General Comments: per sister, Melvin Taylor, pt normally able to hold a conversation, pt now with delayed response time and one word answers        Exercises Other Exercises Other Exercises: Supine: manual RLE hamstring stretch x 1 minute, PROM D1 x 10 to break up tone Other Exercises: Sitting in chair: PROM RUE supination/pronation, scapular protraction/retraction with R palm resting flat on thigh and sliding anteriorly x 5    General Comments        Pertinent Vitals/Pain Pain Assessment Pain Assessment: Faces Faces Pain Scale: Hurts little more Pain Location: R UE with PROM into extension, RLE with stretching Pain Descriptors / Indicators: Discomfort, Grimacing Pain Intervention(s): Limited activity within patient's tolerance, Monitored during session    Home Living                          Prior Function            PT Goals (current goals can now be found in the care plan section) Acute Rehab PT Goals Patient Stated Goal: didn't state PT Goal Formulation: With patient/family Time For Goal Achievement: 09/11/22 Potential to Achieve Goals: Good Progress towards PT goals: Progressing toward goals    Frequency    Min 3X/week      PT Plan Current plan remains appropriate    Co-evaluation              AM-PAC PT "6 Clicks" Mobility   Outcome Measure  Help needed turning from your back to your side while in a flat bed without using bedrails?: A  Lot Help needed moving from lying on your back to sitting on the side of a flat bed without using bedrails?: A Lot Help needed moving to and from a bed to a chair (including a wheelchair)?: A Lot Help needed standing up from a chair using your arms (e.g., wheelchair or bedside chair)?: A Lot Help needed to walk in hospital room?: Total Help needed climbing 3-5 steps with a railing? : Total 6 Click Score: 10    End of Session Equipment Utilized During Treatment: Gait belt Activity Tolerance: Patient tolerated treatment well Patient left: with chair alarm set;with call bell/phone within reach;in chair Nurse Communication: Mobility status PT Visit Diagnosis: Difficulty in walking, not elsewhere classified (R26.2);Hemiplegia and hemiparesis Hemiplegia - Right/Left: Right Hemiplegia - dominant/non-dominant: Dominant Hemiplegia - caused by: Cerebral infarction     Time: [id number] PT Time Calculation (min) (ACUTE ONLY): 37 min  Charges:  $Therapeutic Activity: 23-37 mins                     Wyona Almas, PT, DPT Acute Rehabilitation Services Office [phone]    Deno Etienne 08/30/2022, 3:02 PM

## 2022-08-30 NOTE — Progress Notes (Signed)
Hypoglycemic Event  CBG: 63  Treatment: 4 oz juice/soda  Symptoms: None  Follow-up CBG: IVHS:[id number]  CBG Result:115  Possible Reasons for Event: Unknown  Comments/MD notified:NA    Melvin Taylor

## 2022-08-31 ENCOUNTER — Inpatient Hospital Stay (HOSPITAL_COMMUNITY)
Admission: RE | Admit: 2022-08-31 | Discharge: 2022-09-15 | DRG: 091 | Disposition: A | Discharge status: Home / Self Care | Payer: Medicaid Other | Source: Intra-hospital | Attending: Physical Medicine & Rehabilitation | Admitting: Physical Medicine & Rehabilitation

## 2022-08-31 ENCOUNTER — Encounter (HOSPITAL_COMMUNITY): Payer: Self-pay | Admitting: Physical Medicine & Rehabilitation

## 2022-08-31 ENCOUNTER — Other Ambulatory Visit: Payer: Self-pay

## 2022-08-31 DIAGNOSIS — F319 Bipolar disorder, unspecified: Secondary | ICD-10-CM | POA: Diagnosis present

## 2022-08-31 DIAGNOSIS — Z87891 Personal history of nicotine dependence: Secondary | ICD-10-CM

## 2022-08-31 DIAGNOSIS — E1169 Type 2 diabetes mellitus with other specified complication: Secondary | ICD-10-CM

## 2022-08-31 DIAGNOSIS — E1122 Type 2 diabetes mellitus with diabetic chronic kidney disease: Secondary | ICD-10-CM | POA: Diagnosis present

## 2022-08-31 DIAGNOSIS — E871 Hypo-osmolality and hyponatremia: Secondary | ICD-10-CM | POA: Diagnosis present

## 2022-08-31 DIAGNOSIS — E875 Hyperkalemia: Secondary | ICD-10-CM | POA: Diagnosis not present

## 2022-08-31 DIAGNOSIS — G4700 Insomnia, unspecified: Secondary | ICD-10-CM | POA: Diagnosis present

## 2022-08-31 DIAGNOSIS — Z7984 Long term (current) use of oral hypoglycemic drugs: Secondary | ICD-10-CM

## 2022-08-31 DIAGNOSIS — G40909 Epilepsy, unspecified, not intractable, without status epilepticus: Secondary | ICD-10-CM | POA: Diagnosis present

## 2022-08-31 DIAGNOSIS — G8929 Other chronic pain: Secondary | ICD-10-CM | POA: Diagnosis present

## 2022-08-31 DIAGNOSIS — E11649 Type 2 diabetes mellitus with hypoglycemia without coma: Secondary | ICD-10-CM | POA: Diagnosis present

## 2022-08-31 DIAGNOSIS — Z951 Presence of aortocoronary bypass graft: Secondary | ICD-10-CM | POA: Diagnosis not present

## 2022-08-31 DIAGNOSIS — E785 Hyperlipidemia, unspecified: Secondary | ICD-10-CM | POA: Diagnosis not present

## 2022-08-31 DIAGNOSIS — Z7902 Long term (current) use of antithrombotics/antiplatelets: Secondary | ICD-10-CM

## 2022-08-31 DIAGNOSIS — E1142 Type 2 diabetes mellitus with diabetic polyneuropathy: Secondary | ICD-10-CM | POA: Diagnosis present

## 2022-08-31 DIAGNOSIS — N179 Acute kidney failure, unspecified: Secondary | ICD-10-CM | POA: Diagnosis not present

## 2022-08-31 DIAGNOSIS — R1313 Dysphagia, pharyngeal phase: Secondary | ICD-10-CM | POA: Diagnosis present

## 2022-08-31 DIAGNOSIS — F419 Anxiety disorder, unspecified: Secondary | ICD-10-CM | POA: Diagnosis present

## 2022-08-31 DIAGNOSIS — I2510 Atherosclerotic heart disease of native coronary artery without angina pectoris: Secondary | ICD-10-CM | POA: Diagnosis present

## 2022-08-31 DIAGNOSIS — R269 Unspecified abnormalities of gait and mobility: Principal | ICD-10-CM | POA: Diagnosis present

## 2022-08-31 DIAGNOSIS — F209 Schizophrenia, unspecified: Secondary | ICD-10-CM | POA: Diagnosis present

## 2022-08-31 DIAGNOSIS — Z825 Family history of asthma and other chronic lower respiratory diseases: Secondary | ICD-10-CM

## 2022-08-31 DIAGNOSIS — Z981 Arthrodesis status: Secondary | ICD-10-CM

## 2022-08-31 DIAGNOSIS — I129 Hypertensive chronic kidney disease with stage 1 through stage 4 chronic kidney disease, or unspecified chronic kidney disease: Secondary | ICD-10-CM | POA: Diagnosis present

## 2022-08-31 DIAGNOSIS — Z886 Allergy status to analgesic agent status: Secondary | ICD-10-CM

## 2022-08-31 DIAGNOSIS — Z794 Long term (current) use of insulin: Secondary | ICD-10-CM | POA: Diagnosis not present

## 2022-08-31 DIAGNOSIS — Z8673 Personal history of transient ischemic attack (TIA), and cerebral infarction without residual deficits: Secondary | ICD-10-CM

## 2022-08-31 DIAGNOSIS — M549 Dorsalgia, unspecified: Secondary | ICD-10-CM | POA: Diagnosis present

## 2022-08-31 DIAGNOSIS — I639 Cerebral infarction, unspecified: Secondary | ICD-10-CM | POA: Diagnosis not present

## 2022-08-31 DIAGNOSIS — M19011 Primary osteoarthritis, right shoulder: Secondary | ICD-10-CM | POA: Diagnosis present

## 2022-08-31 DIAGNOSIS — I25810 Atherosclerosis of coronary artery bypass graft(s) without angina pectoris: Secondary | ICD-10-CM

## 2022-08-31 DIAGNOSIS — M109 Gout, unspecified: Secondary | ICD-10-CM | POA: Diagnosis present

## 2022-08-31 DIAGNOSIS — G4733 Obstructive sleep apnea (adult) (pediatric): Secondary | ICD-10-CM | POA: Diagnosis present

## 2022-08-31 DIAGNOSIS — G40219 Localization-related (focal) (partial) symptomatic epilepsy and epileptic syndromes with complex partial seizures, intractable, without status epilepticus: Secondary | ICD-10-CM

## 2022-08-31 DIAGNOSIS — D631 Anemia in chronic kidney disease: Secondary | ICD-10-CM | POA: Diagnosis present

## 2022-08-31 DIAGNOSIS — Z8616 Personal history of COVID-19: Secondary | ICD-10-CM

## 2022-08-31 DIAGNOSIS — Z823 Family history of stroke: Secondary | ICD-10-CM

## 2022-08-31 DIAGNOSIS — Z8249 Family history of ischemic heart disease and other diseases of the circulatory system: Secondary | ICD-10-CM

## 2022-08-31 DIAGNOSIS — Z833 Family history of diabetes mellitus: Secondary | ICD-10-CM

## 2022-08-31 DIAGNOSIS — F0630 Mood disorder due to known physiological condition, unspecified: Secondary | ICD-10-CM | POA: Diagnosis not present

## 2022-08-31 DIAGNOSIS — I10 Essential (primary) hypertension: Secondary | ICD-10-CM | POA: Diagnosis not present

## 2022-08-31 DIAGNOSIS — G9341 Metabolic encephalopathy: Principal | ICD-10-CM | POA: Diagnosis present

## 2022-08-31 DIAGNOSIS — Z79899 Other long term (current) drug therapy: Secondary | ICD-10-CM

## 2022-08-31 DIAGNOSIS — N1832 Chronic kidney disease, stage 3b: Secondary | ICD-10-CM | POA: Diagnosis present

## 2022-08-31 LAB — GLUCOSE, CAPILLARY
Glucose-Capillary: 73 mg/dL (ref 70–99)
Glucose-Capillary: 79 mg/dL (ref 70–99)
Glucose-Capillary: 71 mg/dL (ref 70–99)
Glucose-Capillary: 210 mg/dL — ABNORMAL HIGH (ref 70–99)

## 2022-08-31 MED ORDER — DIAZEPAM 2 MG PO TABS
5.0000 mg | ORAL_TABLET | Freq: Every day | ORAL | Status: DC
  Administered 2022-09-01 – 2022-09-02 (×2): 5 mg via ORAL
  Filled 2022-08-31 (×2): qty 3

## 2022-08-31 MED ORDER — CYCLOSPORINE 0.05 % OP EMUL: 1.0000 [drp] | Freq: Two times a day (BID) | OPHTHALMIC | 0 refills | Status: DC

## 2022-08-31 MED ORDER — FERROUS SULFATE 325 (65 FE) MG PO TABS
325.0000 mg | ORAL_TABLET | Freq: Every day | ORAL | Status: DC
  Administered 2022-09-01 – 2022-09-15 (×15): 325 mg via ORAL
  Filled 2022-08-31 (×15): qty 1

## 2022-08-31 MED ORDER — INSULIN ASPART 100 UNIT/ML IJ SOLN
0.0000 [IU] | Freq: Three times a day (TID) | INTRAMUSCULAR | Status: DC
  Administered 2022-09-02 – 2022-09-04 (×3): 2 [IU] via SUBCUTANEOUS
  Administered 2022-09-06: 3 [IU] via SUBCUTANEOUS
  Administered 2022-09-07: 2 [IU] via SUBCUTANEOUS

## 2022-08-31 MED ORDER — ATORVASTATIN CALCIUM 80 MG PO TABS
80.0000 mg | ORAL_TABLET | Freq: Every day | ORAL | Status: DC
  Administered 2022-08-31 – 2022-09-14 (×15): 80 mg via ORAL
  Filled 2022-08-31 (×15): qty 1

## 2022-08-31 MED ORDER — ACETAMINOPHEN 160 MG/5ML PO SOLN
650.0000 mg | ORAL | Status: DC | PRN
  Administered 2022-09-02 – 2022-09-12 (×3): 650 mg
  Filled 2022-08-31 (×3): qty 20.3

## 2022-08-31 MED ORDER — LISINOPRIL 5 MG PO TABS
5.0000 mg | ORAL_TABLET | Freq: Every day | ORAL | Status: DC
  Administered 2022-08-31 – 2022-09-03 (×4): 5 mg via ORAL
  Filled 2022-08-31 (×4): qty 1

## 2022-08-31 MED ORDER — ADULT MULTIVITAMIN W/MINERALS CH
1.0000 | ORAL_TABLET | Freq: Every day | ORAL | Status: DC
  Administered 2022-09-01 – 2022-09-15 (×15): 1 via ORAL
  Filled 2022-08-31 (×15): qty 1

## 2022-08-31 MED ORDER — ISOSORBIDE MONONITRATE ER 30 MG PO TB24
30.0000 mg | ORAL_TABLET | Freq: Every day | ORAL | Status: DC
  Administered 2022-09-01 – 2022-09-15 (×15): 30 mg via ORAL
  Filled 2022-08-31 (×15): qty 1

## 2022-08-31 MED ORDER — NEPRO/CARBSTEADY PO LIQD
237.0000 mL | Freq: Three times a day (TID) | ORAL | Status: DC
  Administered 2022-09-03 – 2022-09-05 (×2): 237 mL via ORAL

## 2022-08-31 MED ORDER — METOPROLOL TARTRATE 12.5 MG HALF TABLET
12.5000 mg | ORAL_TABLET | Freq: Two times a day (BID) | ORAL | Status: DC
  Administered 2022-08-31 – 2022-09-15 (×27): 12.5 mg via ORAL
  Filled 2022-08-31 (×30): qty 1

## 2022-08-31 MED ORDER — ACETAMINOPHEN 650 MG RE SUPP: 650.0000 mg | RECTAL | Status: DC | PRN

## 2022-08-31 MED ORDER — CYCLOSPORINE 0.05 % OP EMUL
1.0000 [drp] | Freq: Two times a day (BID) | OPHTHALMIC | Status: DC
  Administered 2022-08-31 – 2022-09-15 (×29): 1 [drp] via OPHTHALMIC
  Filled 2022-08-31 (×31): qty 30

## 2022-08-31 MED ORDER — DIVALPROEX SODIUM 250 MG PO DR TAB
500.0000 mg | DELAYED_RELEASE_TABLET | Freq: Two times a day (BID) | ORAL | Status: DC
  Administered 2022-08-31 – 2022-09-01 (×3): 500 mg via ORAL
  Filled 2022-08-31 (×4): qty 2

## 2022-08-31 MED ORDER — ALLOPURINOL 100 MG PO TABS
100.0000 mg | ORAL_TABLET | Freq: Every day | ORAL | Status: DC
  Administered 2022-08-31 – 2022-09-15 (×16): 100 mg via ORAL
  Filled 2022-08-31 (×16): qty 1

## 2022-08-31 MED ORDER — ALBUTEROL SULFATE (2.5 MG/3ML) 0.083% IN NEBU: 3.0000 mL | INHALATION_SOLUTION | RESPIRATORY_TRACT | Status: DC | PRN

## 2022-08-31 MED ORDER — ESCITALOPRAM OXALATE 10 MG PO TABS
20.0000 mg | ORAL_TABLET | Freq: Every day | ORAL | Status: DC
  Administered 2022-09-01 – 2022-09-15 (×15): 20 mg via ORAL
  Filled 2022-08-31 (×15): qty 2

## 2022-08-31 MED ORDER — LEVETIRACETAM 500 MG PO TABS
1000.0000 mg | ORAL_TABLET | Freq: Two times a day (BID) | ORAL | Status: DC
  Administered 2022-08-31 – 2022-09-05 (×11): 1000 mg via ORAL
  Filled 2022-08-31 (×12): qty 2

## 2022-08-31 MED ORDER — AMLODIPINE BESYLATE 10 MG PO TABS
10.0000 mg | ORAL_TABLET | Freq: Every day | ORAL | Status: DC
  Administered 2022-08-31 – 2022-09-03 (×4): 10 mg via ORAL
  Filled 2022-08-31 (×4): qty 1

## 2022-08-31 MED ORDER — TICAGRELOR 90 MG PO TABS
90.0000 mg | ORAL_TABLET | Freq: Two times a day (BID) | ORAL | Status: DC
  Administered 2022-08-31 – 2022-09-15 (×30): 90 mg via ORAL
  Filled 2022-08-31 (×30): qty 1

## 2022-08-31 NOTE — H&P (Signed)
Physical Medicine and Rehabilitation Admission H&P       HPI: Melvin Taylor is a 57 year old right-handed male with history of bipolar disorder/schizophrenia, CAD/CABG, diabetes mellitus, hypertension, polysubstance abuse, OSA and will not use CPAP and seizure disorder//epilepsy maintained on Depakote as well as Lamictal as well as left MCA infarction with stenting maintained on Brilinta as well as Plavix receiving inpatient rehab services 12/21/2020 - 12/30/2020.  He was discharged to home ambulating 150 feet without assistive device contact-guard.  Per chart review patient lives alone.  1 level apartment with elevator.  Has a home health aide 53 hours a week to assist with making meals dressing bathing managing medications.  Sister does finances and his POA.  Presented 08/27/2022 from outside hospital after admission 1/20-27 with altered mental status.  He was noted to have persistent punctate focus of restricted diffusion in the left temporal medial lobe concerning for acute/subacute CVA on imaging at outside hospital.  MRI showed trace SDH that developed during the hospitalization 4 mm in the left occipital region.  He was transferred to [facility].  Cranial CT scan showed stable trace subdural hematoma along the posterior falx and posteriorly occipital lobe.  Stable remote infarcts of the left basal ganglia, anterior left frontal lobe and corona radiata but no acute CVA noted.  Stable left MCA stent.  Admission chemistries unremarkable except potassium 3.2, BUN 39, hemoglobin 9.2, valproic acid level 66.  Neurology follow-up patient currently remains on Brilinta as prior to admission but Plavix on hold.  Neurology felt that  risk of stent restenosis if there is prolonged cessation of Brilinta would most likely outweigh the risk of hemorrhage.  Neurosurgery signed off due to small size and stable appearance of subdural hematoma.  EEG negative for seizure currently remains on Keppra which  had been started at outside hospital.  His Depakote and Lamictal prior to admission are currently on hold.  Echocardiogram ejection fraction of 60 to 65% no wall motion abnormalities grade 1 diastolic dysfunction.  Hospital course COVID questionably incidental finding patient asymptomatic and completed antiviral/steroid course and maintained on airborne contact precautions.  He has completed his quarantine today.  Currently on dysphagia #2 nectar thick liquid diet.  Patient reports pain in his legs however he is unable to provide many details regarding this issue.  He does admit to history of diabetes with polyneuropathy.  Therapy evaluations completed due to patient decreased functional mobility altered mental status/dysphagia was admitted for a comprehensive rehab program.   Review of Systems  Constitutional:  Positive for malaise/fatigue. Negative for chills and fever.  HENT:  Negative for hearing loss.   Eyes:  Negative for blurred vision and double vision.  Respiratory:  Negative for shortness of breath and wheezing.   Cardiovascular:  Positive for leg swelling. Negative for chest pain and palpitations.  Gastrointestinal:  Positive for constipation. Negative for heartburn, nausea and vomiting.  Genitourinary:  Negative for dysuria, flank pain and hematuria.  Musculoskeletal:  Positive for back pain, joint pain, myalgias and neck pain.  Skin:  Negative for rash.  Neurological:  Positive for seizures and headaches.  Psychiatric/Behavioral:  The patient has insomnia.        Schizophrenia/bipolar disorder  All other systems reviewed and are negative.       Past Medical History:  Diagnosis Date   Bipolar 1 disorder ([place])      depression/anxiety   Bone spur      left heel  Cervical radiculopathy     Chronic back pain     Chronic chest wall pain     Chronic neck pain     Coronary artery disease      a. s/p CABG in 03/2016 with LIMA-LAD, SVG-D1, SVG-RCA, and Seq SVG-mid and distal OM    Diabetes mellitus      Type II   Hallucinations      "long history of them"   Headache(784.0)     History of gout     HTN (hypertension)     Insomnia     Neuropathy     Pain management     Polysubstance abuse ([place])     Right leg pain      chronic   Schizophrenia ([place])     Seizures ([place])      last sz between July 5-9th, 2016; epilepsy   Sleep apnea     Stroke ([place])     Transfusion of blood product refused for religious reason           Past Surgical History:  Procedure Laterality Date   ANTERIOR CERVICAL DECOMP/DISCECTOMY FUSION N/A 12/14/2015    Procedure: ANTERIOR CERVICAL DECOMPRESSION/DISCECTOMY FUSION CERVICAL FIVE -SIX;  Surgeon: Earnie Larsson, MD;  Location: [place] [facility];  Service: Neurosurgery;  Laterality: N/A;   BIOPSY   05/07/2015    Procedure: BIOPSY (Gastric);  Surgeon: [name], MD;  [facility]: [facility];  Service: Endoscopy;;   CARDIAC CATHETERIZATION N/A 03/07/2016    Procedure: Left Heart Cath and Coronary Angiography;  Surgeon: Belva Crome, MD;  Location: [place] [facility];  Service: Cardiovascular;  Laterality: N/A;   COLONOSCOPY WITH PROPOFOL N/A 05/07/2015    [id number] diverticulosis, multiple colon polyps removed, tubular adenoma, serrated colon polyp. Next colonoscopy October 2019   COLONOSCOPY WITH PROPOFOL N/A 08/02/2018    Procedure: COLONOSCOPY WITH PROPOFOL;  Surgeon: [name], MD;  [facility];  Service: Endoscopy;  Laterality: N/A;  12:00pm   CORONARY ARTERY BYPASS GRAFT N/A 03/28/2016    Procedure: CORONARY ARTERY BYPASS GRAFTING (CABG) x 5 USING GREATER SAPHENOUS VEIN;  Surgeon: [name], MD;  Location: [place] [facility];  Service: Open Heart Surgery;  Laterality: N/A;   ENDOVEIN HARVEST OF GREATER SAPHENOUS VEIN Right 03/28/2016    Procedure: ENDOVEIN HARVEST OF GREATER SAPHENOUS VEIN;  Surgeon: [name], MD;  Location: [place];  Service: Open Heart Surgery;  Laterality: Right;   ESOPHAGEAL DILATION N/A 05/07/2015    Procedure:  ESOPHAGEAL DILATION WITH 56FR MALONEY DILATOR;  Surgeon: [name], MD;  [facility];  Service: Endoscopy;  Laterality: N/A;   ESOPHAGOGASTRODUODENOSCOPY (EGD) WITH PROPOFOL N/A 05/07/2015    RMR: Status post dilation of normal esophagus. Gastritis.   IR CT HEAD LTD   12/17/2020   IR CT HEAD LTD   12/17/2020   IR INTRA CRAN STENT   12/17/2020   IR PERCUTANEOUS ART THROMBECTOMY/INFUSION INTRACRANIAL INC DIAG ANGIO   12/17/2020   IR RADIOLOGIST EVAL & MGMT   03/25/2021   KNEE SURGERY Left      arthroscopy   MANDIBLE FRACTURE SURGERY       POLYPECTOMY   05/07/2015    Procedure: POLYPECTOMY (Hepatic Flexure, Distal Transverse Colon, Rectal);  Surgeon: [name], MD;  [facility];  Service: Endoscopy;;   RADIOLOGY WITH ANESTHESIA N/A 12/16/2020    Procedure: IR WITH ANESTHESIA;  Surgeon: Luanne Bras, MD;  Location: [place];  Service: Radiology;  Laterality: N/A;  TEE WITHOUT CARDIOVERSION N/A 03/28/2016    Procedure: TRANSESOPHAGEAL ECHOCARDIOGRAM (TEE);  Surgeon: [name], MD;  Location: [place];  Service: Open Heart Surgery;  Laterality: N/A;   TONSILLECTOMY       TRIGGER FINGER RELEASE Left 10/15/2021    Procedure: RELEASE TRIGGER FINGER/A-1 PULLEY left middle or long finger;  Surgeon: Carole Civil, MD;  Location: [facility];  Service: Orthopedics;  Laterality: Left;         Family History  Problem Relation Age of Onset   Diabetes Father     Hypertension Father     Sleep apnea Father     Stroke Sister     Arthritis Other     Diabetes Other     Asthma Other      Social History:  reports that he has quit smoking. His smoking use included cigarettes. He has a 15.00 pack-year smoking history. He has never used smokeless tobacco. He reports that he does not currently use alcohol. He reports that he does not currently use drugs after having used the following drugs: Marijuana. Allergies:       Allergies  Allergen Reactions   Aspirin Swelling and Other (See  Comments)      Facial swelling           Medications Prior to Admission  Medication Sig Dispense Refill   albuterol (PROVENTIL HFA;VENTOLIN HFA) 108 (90 Base) MCG/ACT inhaler Inhale 2 puffs into the lungs every 4 (four) hours as needed for wheezing or shortness of breath.        allopurinol (ZYLOPRIM) 100 MG tablet Take 100 mg by mouth daily.       amLODipine (NORVASC) 10 MG tablet Take 1 tablet (10 mg total) by mouth daily. (Patient taking differently: Take 10 mg by mouth daily. Hold BP <110, and/or MAP <70) 90 tablet 0   ascorbic acid (VITAMIN C) 1000 MG tablet Take 1,000 mg by mouth daily.       atorvastatin (LIPITOR) 80 MG tablet Take 1 tablet (80 mg total) by mouth at bedtime. (Patient taking differently: Take 40 mg by mouth at bedtime.) 30 tablet 0   [EXPIRED] azithromycin (ZITHROMAX) 500 MG tablet Take 500 mg by mouth daily. For 7 days       [EXPIRED] cefTRIAXone (ROCEPHIN) IVPB Inject 1 g into the vein daily. x7 days       cholecalciferol (VITAMIN D3) 25 MCG (1000 UNIT) tablet Take 1,000 Units by mouth daily.       clopidogrel (PLAVIX) 75 MG tablet Take 75 mg by mouth daily.       [EXPIRED] DEXAMETHASONE SOD PHOS-NACL IV Inject 6 mg into the vein daily. x7 days.       diazepam (VALIUM) 5 MG tablet Take 5 mg by mouth daily.       divalproex (DEPAKOTE) 500 MG DR tablet Take 1 tablet (500 mg total) by mouth 2 (two) times daily. (Patient taking differently: Take 750 mg by mouth 2 (two) times daily.) 60 tablet 11   docusate sodium (COLACE) 100 MG capsule Take 100 mg by mouth 2 (two) times daily.       escitalopram (LEXAPRO) 20 MG tablet Take 20 mg by mouth daily.        famotidine (PEPCID) 20 MG tablet Take 20 mg by mouth 2 (two) times daily.       ferrous sulfate 325 (65 FE) MG tablet Take 325 mg by mouth daily.       heparin 5000 UNIT/ML injection  Inject 5,000 Units into the skin every 12 (twelve) hours.       insulin lispro (HUMALOG) 100 UNIT/ML injection Inject 0-20 Units into the skin  4 (four) times daily. Per sliding scale       isosorbide mononitrate (IMDUR) 30 MG 24 hr tablet Take 1 tablet (30 mg total) by mouth daily. (Patient taking differently: Take 60 mg by mouth daily. Hold SBP <110 or MAP <70) 90 tablet 3   lactated ringers infusion Inject 100 mL/hr into the vein continuous.       levETIRAcetam (KEPPRA) 1000 MG tablet Take 1,000 mg by mouth 2 (two) times daily.       lisinopril (ZESTRIL) 5 MG tablet Take 5 mg by mouth daily. Hold for SBP < 110 and/or MAP < 70 and/or potassium in the past 24 hours > 5.5 and/or SCr in the past 24 hours > 2.5.       metoprolol tartrate (LOPRESSOR) 25 MG tablet Take 12.5 mg by mouth 2 (two) times daily. Hold for: HR < 60 and/or SBP < 110 and/or MAP < 70       Sodium Zirconium Cyclosilicate (LOKELMA PO) Take 1 tablet by mouth in the morning and at bedtime.       Blood Glucose Monitoring Suppl (ACCU-CHEK GUIDE ME) w/Device KIT 1 Piece by Does not apply route as directed. 1 kit 0   glucose blood (ACCU-CHEK GUIDE) test strip Monitor glucose 2 times a day. 150 each 2   ticagrelor (BRILINTA) 90 MG TABS tablet Take 1 tablet (90 mg total) by mouth 2 (two) times daily. (Patient not taking: Reported on 08/28/2022) 60 tablet 5   Vitamin D, Ergocalciferol, (DRISDOL) 1.25 MG (50000 UNIT) CAPS capsule Take 50,000 Units by mouth every 7 (seven) days. (Patient not taking: Reported on 08/28/2022)              Home: Home Living Family/patient expects to be discharged to:: Private residence Living Arrangements: Other (Comment) Available Help at Discharge: Family, Available 24 hours/day Type of Home: Apartment Home Access: Elevator Home Layout: Other (Comment) (2nd floor with elevator) Bathroom Shower/Tub: Chiropodist: Standard Bathroom Accessibility: Yes Home Equipment: Conservation officer, nature (2 wheels), [employer] - single point, Civil engineer, contracting, Hand held shower head Additional Comments: info provided by sister  Lives With: Family   Functional  History: Prior Function Prior Level of Function : Needs assist Mobility Comments: Pt. went out for appts ADLs Comments: home health aide to assist with making meals, dressing, bathing, manages medications, sisters does finances and is his POA   Functional Status:  Mobility: Bed Mobility Overal bed mobility: Needs Assistance Bed Mobility: Supine to Sit Supine to sit: Mod assist General bed mobility comments: Pt initiating well, exiting towards left side of bed, assist at trunk to bring upright Transfers Overall transfer level: Needs assistance Equipment used: 1 person hand held assist, Rolling walker (2 wheels) Transfers: Sit to/from Stand, Bed to chair/wheelchair/BSC Sit to Stand: Mod assist Bed to/from chair/wheelchair/BSC transfer type:: Stand pivot Stand pivot transfers: Min assist Step pivot transfers: Max assist, +2 physical assistance General transfer comment: Cues for pushing off with left hand, modA to power up from edge of bed. On 2nd trial, pt holding onto RW with LUE in order to stabilize to provide peri care. On 3rd standing trial, pt pivoting to left with minA, decreased R foot clearance, cues for reaching back with LUE Ambulation/Gait Ambulation/Gait assistance: Mod assist, +2 safety/equipment, +2 physical assistance Gait Distance (Feet): 3 Feet Assistive  device: 2 person hand held assist Gait Pattern/deviations: Step-to pattern, Decreased stride length, Shuffle, Decreased weight shift to right General Gait Details: Increased R foot external rotation, decreased bilateral foot clearance, modA for standing balance and close chair follow Gait velocity: decreased Gait velocity interpretation: <1.31 ft/sec, indicative of household ambulator   ADL: ADL Overall ADL's : Needs assistance/impaired Eating/Feeding: Moderate assistance, Sitting Grooming: Moderate assistance, Sitting Upper Body Bathing: Maximal assistance, Sitting Lower Body Bathing: Maximal assistance, +2 for  safety/equipment, +2 for physical assistance, Sit to/from stand Upper Body Dressing : Maximal assistance, Sitting Lower Body Dressing: Maximal assistance, +2 for safety/equipment, +2 for physical assistance, Sit to/from stand Toilet Transfer: +2 for physical assistance, +2 for safety/equipment, Maximal assistance Toileting- Clothing Manipulation and Hygiene: Maximal assistance, +2 for physical assistance, +2 for safety/equipment, Sit to/from stand Functional mobility during ADLs: +2 for physical assistance, +2 for safety/equipment, Maximal assistance General ADL Comments: limited by L hemibody impairments, cog, communication   Cognition: Cognition Overall Cognitive Status: Impaired/Different from baseline Orientation Level: Oriented to person, Disoriented to place, Disoriented to time, Disoriented to situation Cognition Arousal/Alertness: Awake/alert Behavior During Therapy: Flat affect Overall Cognitive Status: Impaired/Different from baseline Area of Impairment: Attention, Memory, Following commands, Safety/judgement, Awareness, Problem solving Orientation Level: Disoriented to, Place, Time, Situation Current Attention Level: Sustained Memory: Decreased short-term memory Following Commands: Follows one step commands with increased time Safety/Judgement: Decreased awareness of safety, Decreased awareness of deficits Awareness: Intellectual Problem Solving: Slow processing, Difficulty sequencing, Requires verbal cues, Requires tactile cues General Comments: per sister, [name], pt normally able to hold a conversation, pt now with delayed response time and one word answers   Physical Exam: Blood pressure (!) 150/95, pulse 60, temperature 98.9 F (37.2 C), temperature source Oral, resp. rate 15, height '5\' 5"'$  (1.651 m), weight 52.9 kg, SpO2 100 %. Physical Exam     General:  No apparent distress HEENT: Head is normocephalic, atraumatic, PERRLA, EOMI, sclera anicteric, oral mucosa pink and  moist Neck: Supple without JVD or lymphadenopathy Heart: Reg rate and rhythm. No murmurs rubs or gallops Chest: CTA bilaterally without wheezes, rales, or rhonchi; no distress Abdomen: Soft, non-tender, non-distended, bowel sounds positive. Extremities: No clubbing, cyanosis, or edema. Pulses are 2+ Psych: Very flat Skin: Clean and intact without signs of breakdown Neuro: Alert and oriented to person only.  He knows he is in the hospital but is unable to provide which hospital or city.  He does not know the date.  Unable to explain why he is in the hospital.  Follows commands intermittently with a lot of encouragement, CN2-12 intact-difficult to assess facial movements due to command following, appears to have left gaze preference however he was able to cross midline after several attempts, he was able to name 2 objects, unable to repeat, minimal verbal output, usually responds with nodding or shaking his head or "yep", very delayed responses, phonation appears normal Strength LUE and LLE at least 4 out of 5, limited by effort RUE 1 out of 5 proximally to 2 out of 5 distally, appears this may be limited by effort RLE able to lift to gravity, ankle DF 1 out of 5, MMT appears to be limited by effort and command following DTR 2+ throughout Sensation to LT intact in all 4 extremities Finger-nose intact on the left Musculoskeletal: No joint swelling noted, normal tone noted, appears to have intermittent tenderness throughout bilateral upper extremities and lower extremities     Lab Results Last 48 Hours  Results for orders placed or performed during the hospital encounter of 08/27/22 (from the past 48 hour(s))  Glucose, capillary     Status: Abnormal    Collection Time: 08/29/22 12:02 PM  Result Value Ref Range    Glucose-Capillary 109 (H) 70 - 99 mg/dL      Comment: Glucose reference range applies only to samples taken after fasting for at least 8 hours.    Comment 1 Notify RN       Comment 2 Document in Chart    Glucose, capillary     Status: Abnormal    Collection Time: 08/29/22  3:48 PM  Result Value Ref Range    Glucose-Capillary 230 (H) 70 - 99 mg/dL      Comment: Glucose reference range applies only to samples taken after fasting for at least 8 hours.    Comment 1 Notify RN      Comment 2 Document in Chart    Glucose, capillary     Status: Abnormal    Collection Time: 08/29/22  9:57 PM  Result Value Ref Range    Glucose-Capillary 264 (H) 70 - 99 mg/dL      Comment: Glucose reference range applies only to samples taken after fasting for at least 8 hours.  Glucose, capillary     Status: Abnormal    Collection Time: 08/30/22  5:39 AM  Result Value Ref Range    Glucose-Capillary 63 (L) 70 - 99 mg/dL      Comment: Glucose reference range applies only to samples taken after fasting for at least 8 hours.  Glucose, capillary     Status: Abnormal    Collection Time: 08/30/22  6:07 AM  Result Value Ref Range    Glucose-Capillary 115 (H) 70 - 99 mg/dL      Comment: Glucose reference range applies only to samples taken after fasting for at least 8 hours.  Glucose, capillary     Status: Abnormal    Collection Time: 08/30/22 11:45 AM  Result Value Ref Range    Glucose-Capillary 179 (H) 70 - 99 mg/dL      Comment: Glucose reference range applies only to samples taken after fasting for at least 8 hours.  Glucose, capillary     Status: Abnormal    Collection Time: 08/30/22  5:41 PM  Result Value Ref Range    Glucose-Capillary 316 (H) 70 - 99 mg/dL      Comment: Glucose reference range applies only to samples taken after fasting for at least 8 hours.    Comment 1 Notify RN      Comment 2 Document in Chart    Glucose, capillary     Status: Abnormal    Collection Time: 08/30/22  9:12 PM  Result Value Ref Range    Glucose-Capillary 104 (H) 70 - 99 mg/dL      Comment: Glucose reference range applies only to samples taken after fasting for at least 8 hours.    Comment 1  Notify RN      Comment 2 Document in Chart    Glucose, capillary     Status: None    Collection Time: 08/31/22  7:01 AM  Result Value Ref Range    Glucose-Capillary 73 70 - 99 mg/dL      Comment: Glucose reference range applies only to samples taken after fasting for at least 8 hours.       Imaging Results (Last 48 hours)  DG Swallowing Func-Speech Pathology   Result Date: 08/30/2022 Table  formatting from the original result was not included. Objective Swallowing Evaluation: Type of Study: MBS-Modified Barium Swallow Study  Patient Details Name: Melvin Taylor MRN: [id number] Date of Birth: 10-09-1965 Today's Date: 08/30/2022 Time: SLP Start Time (ACUTE ONLY): 7062 -SLP Stop Time (ACUTE ONLY): 3762 SLP Time Calculation (min) (ACUTE ONLY): 20 min Past Medical History: Past Medical History: Diagnosis Date  Bipolar 1 disorder ([place])   depression/anxiety  Bone spur   left heel  Cervical radiculopathy   Chronic back pain   Chronic chest wall pain   Chronic neck pain   Coronary artery disease   a. s/p CABG in 03/2016 with LIMA-LAD, SVG-D1, SVG-RCA, and Seq SVG-mid and distal OM  Diabetes mellitus   Type II  Hallucinations   "long history of them"  Headache(784.0)   History of gout   HTN (hypertension)   Insomnia   Neuropathy   Pain management   Polysubstance abuse ([place])   Right leg pain   chronic  Schizophrenia ([place])   Seizures ([place])   last sz between July 5-9th, 2016; epilepsy  Sleep apnea   Stroke ([place])   Transfusion of blood product refused for religious reason  Past Surgical History: Past Surgical History: Procedure Laterality Date  ANTERIOR CERVICAL DECOMP/DISCECTOMY FUSION N/A 12/14/2015  Procedure: ANTERIOR CERVICAL DECOMPRESSION/DISCECTOMY FUSION CERVICAL FIVE -SIX;  Surgeon: Earnie Larsson, MD;  Location: [facility];  Service: Neurosurgery;  Laterality: N/A;  BIOPSY  05/07/2015  Procedure: BIOPSY (Gastric);  Surgeon: [name], MD;  [facility];  Service: Endoscopy;;  CARDIAC CATHETERIZATION N/A  03/07/2016  Procedure: Left Heart Cath and Coronary Angiography;  Surgeon: Belva Crome, MD;  Location: [place] [facility];  Service: Cardiovascular;  Laterality: N/A;  COLONOSCOPY WITH PROPOFOL N/A 05/07/2015  GBT:[id number] diverticulosis, multiple colon polyps removed, tubular adenoma, serrated colon polyp. Next colonoscopy October 2019  COLONOSCOPY WITH PROPOFOL N/A 08/02/2018  Procedure: COLONOSCOPY WITH PROPOFOL;  Surgeon: [name], MD;  [facility];  Service: Endoscopy;  Laterality: N/A;  12:00pm  CORONARY ARTERY BYPASS GRAFT N/A 03/28/2016  Procedure: CORONARY ARTERY BYPASS GRAFTING (CABG) x 5 USING GREATER SAPHENOUS VEIN;  Surgeon: [name], MD;  Location: [place] [facility];  Service: Open Heart Surgery;  Laterality: N/A;  ENDOVEIN HARVEST OF GREATER SAPHENOUS VEIN Right 03/28/2016  Procedure: ENDOVEIN HARVEST OF GREATER SAPHENOUS VEIN;  Surgeon: [name], MD;  Location: [place];  Service: Open Heart Surgery;  Laterality: Right;  ESOPHAGEAL DILATION N/A 05/07/2015  Procedure: ESOPHAGEAL DILATION WITH 56FR MALONEY DILATOR;  Surgeon: [name], MD;  [facility];  Service: Endoscopy;  Laterality: N/A;  ESOPHAGOGASTRODUODENOSCOPY (EGD) WITH PROPOFOL N/A 05/07/2015  RMR: Status post dilation of normal esophagus. Gastritis.  IR CT HEAD LTD  12/17/2020  IR CT HEAD LTD  12/17/2020  IR INTRA CRAN STENT  12/17/2020  IR PERCUTANEOUS ART THROMBECTOMY/INFUSION INTRACRANIAL INC DIAG ANGIO  12/17/2020  IR RADIOLOGIST EVAL & MGMT  03/25/2021  KNEE SURGERY Left   arthroscopy  MANDIBLE FRACTURE SURGERY    POLYPECTOMY  05/07/2015  Procedure: POLYPECTOMY (Hepatic Flexure, Distal Transverse Colon, Rectal);  Surgeon: [name], MD;  [facility];  Service: Endoscopy;;  RADIOLOGY WITH ANESTHESIA N/A 12/16/2020  Procedure: IR WITH ANESTHESIA;  Surgeon: Luanne Bras, MD;  Location: [place];  Service: Radiology;  Laterality: N/A;  TEE WITHOUT CARDIOVERSION N/A 03/28/2016  Procedure: TRANSESOPHAGEAL  ECHOCARDIOGRAM (TEE);  Surgeon: [name], MD;  Location: [place];  Service: Open Heart Surgery;  Laterality:  N/A;  TONSILLECTOMY    TRIGGER FINGER RELEASE Left 10/15/2021  Procedure: RELEASE TRIGGER FINGER/A-1 PULLEY left middle or long finger;  Surgeon: Carole Civil, MD;  [facility];  Service: Orthopedics;  Laterality: Left; HPI: 57 y.o. male with medical history significant of bipolar d/o vs/ schizophrenia, chronic chest wall/neck/back pain, CAD s/p CABG, DM, HTN, polysubstance abuse, OSA, and seizure d/o presenting with AMS.  He was [facility] with AMS, minimally improving during hospitalization.  He was noted to have a persistent punctate focus of restricted diffusion in the L medial temporal lobe, concerning for acute/subacute CVA.  MRI/CT with SDH that developed during the hospitalization, 4 mm in L occipital region.  He has been on IV Keppra throughout the hospitalization without apparent seizure activity.  He is able to answer very limited questions, none about orientation.  He does follow some commands  Subjective: Alert, cooperative  Recommendations for follow up therapy are one component of a multi-disciplinary discharge planning process, led by the attending physician.  Recommendations may be updated based on patient status, additional functional criteria and insurance authorization. Assessment / Plan / Recommendation   08/30/2022   5:48 PM Clinical Impressions Clinical Impression Patient presents with a mild oral and a mild-moderate pharyngeal phase dysphagia as per this MBS. During oral phase, patient exhibited weak lingual manipulation and mastication leading to delays in bolus formation and transit. During pharyngeal phase, patient with vallecular residuals with all consistencies as well as pyriform sinus residuals with honey thick, nectar thick and thin liquids. Residuals started to clear with subsequent swallows. No penetration or aspiration observed with teaspoon  sips of nectar, thin or honey thick liquids. During consecutive cup sips with thin liquids and nectar thick liquids, patient with reduced epiglottic inversion leading to penetration above vocal cords. In addition, airway closure was adequate with nectar thick liquids even during concecutive sips. Aspiration observed during the swallow with consecutive sips of thin liquids and although it was trace to mild in amount, patient did not sense. (silent aspiration). SLP recommending to continue with Dys 2 solids, nectar thick liquids at this time and will trial thin liquids at bedside. SLP Visit Diagnosis Dysphagia, oropharyngeal phase (R13.12) Impact on safety and function Mild aspiration risk;Moderate aspiration risk     08/30/2022   5:48 PM Treatment Recommendations Treatment Recommendations Therapy as outlined in treatment plan below     08/30/2022   5:55 PM Prognosis Prognosis for Safe Diet Advancement Good Barriers to Reach Goals Severity of deficits;Time post onset   08/30/2022   5:48 PM Diet Recommendations SLP Diet Recommendations Dysphagia 2 (Fine chop) solids;Nectar thick liquid Liquid Administration via Cup;Straw Medication Administration Whole meds with puree Compensations Slow rate;Small sips/bites;Minimize environmental distractions;Lingual sweep for clearance of pocketing Postural Changes Seated upright at 90 degrees     08/30/2022   5:48 PM Other Recommendations Oral Care Recommendations Oral care BID Other Recommendations Order thickener from pharmacy Follow Up Recommendations Acute inpatient rehab (3hours/day) Functional Status Assessment Patient has had a recent decline in their functional status and demonstrates the ability to make significant improvements in function in a reasonable and predictable amount of time.   08/30/2022   5:48 PM Frequency and Duration  Speech Therapy Frequency (ACUTE ONLY) min 2x/week Treatment Duration 2 weeks     08/30/2022   5:43 PM Oral Phase Oral Phase Impaired Oral - Honey  Teaspoon Reduced posterior propulsion;Weak lingual manipulation Oral - Nectar Teaspoon Weak lingual manipulation;Reduced posterior propulsion Oral - Nectar  Cup Weak lingual manipulation;Reduced posterior propulsion Oral - Thin Teaspoon Weak lingual manipulation;Holding of bolus Oral - Thin Cup Premature spillage Oral - Puree Reduced posterior propulsion;Weak lingual manipulation Oral - Mech Soft Reduced posterior propulsion;Weak lingual manipulation;Piecemeal swallowing;Impaired mastication    08/30/2022   5:45 PM Pharyngeal Phase Pharyngeal Phase Impaired Pharyngeal- Honey Teaspoon Pharyngeal residue - valleculae;Pharyngeal residue - pyriform Pharyngeal- Nectar Teaspoon Pharyngeal residue - valleculae;Pharyngeal residue - pyriform Pharyngeal- Nectar Cup Delayed swallow initiation-pyriform sinuses;Reduced airway/laryngeal closure;Reduced epiglottic inversion;Penetration/Aspiration during swallow;Pharyngeal residue - valleculae;Pharyngeal residue - pyriform Pharyngeal Material enters airway, remains ABOVE vocal cords then ejected out;Material enters airway, remains ABOVE vocal cords and not ejected out Pharyngeal- Thin Teaspoon Pharyngeal residue - valleculae;Pharyngeal residue - pyriform Pharyngeal- Thin Cup Pharyngeal residue - valleculae;Pharyngeal residue - pyriform;Penetration/Aspiration during swallow;Reduced epiglottic inversion;Reduced airway/laryngeal closure Pharyngeal Material enters airway, passes BELOW cords without attempt by patient to eject out (silent aspiration) Pharyngeal- Puree Reduced pharyngeal peristalsis;Reduced tongue base retraction;Pharyngeal residue - valleculae;Pharyngeal residue - pyriform    08/30/2022   5:48 PM Cervical Esophageal Phase  Cervical Esophageal Phase Impaired Cervical Esophageal Comment barium retention above UES Sonia Baller, MA, CCC-SLP Speech Therapy                      EEG adult   Result Date: 08/29/2022 Lora Havens, MD     08/29/2022 10:26 AM Patient Name:  Melvin Taylor MRN: [id number] Epilepsy Attending: Lora Havens Referring Physician/Provider: Katy Apo, NP Date: 08/28/2022 Duration: 21.18 mins Patient history: 57yo M with ams. EEG to evaluate for seizure Level of alertness: Awake AEDs during EEG study: None Technical aspects: This EEG study was done with scalp electrodes positioned according to the 10-20 International system of electrode placement. Electrical activity was reviewed with band pass filter of 1-'70Hz'$ , sensitivity of 7 uV/mm, display speed of 63m/sec with a '60Hz'$  notched filter applied as appropriate. EEG data were recorded continuously and digitally stored.  Video monitoring was available and reviewed as appropriate. Description: EEG showed continuous generalized 3 to 6 Hz theta-delta slowing. Hyperventilation and photic stimulation were not performed.   ABNORMALITY - Continuous slow, generalized IMPRESSION: This study is suggestive of moderate to severe diffuse encephalopathy, nonspecific etiology. No seizures or epileptiform discharges were seen throughout the recording. Priyanka O Yadav           Blood pressure (!) 150/95, pulse 60, temperature 98.9 F (37.2 C), temperature source Oral, resp. rate 15, height '5\' 5"'$  (1.651 m), weight 52.9 kg, SpO2 100 %.   Medical Problem List and Plan: 1. Functional deficits secondary to acute metabolic encephalopathy.  Stable trace subdural hematoma along the posterior falx and posterior occipital lobe.  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  Received inpatient rehab services 12/21/2020 - 12/30/2020 after CVA             -patient may 16-18 days shower             -ELOS/Goals: PT/OT/SLP Supervision to MIN A             -Admit to CIR, evaluations tomorrow by PT OT and SLP 2.  Antithrombotics: -DVT/anticoagulation:  Mechanical:  Antiembolism stockings, knee (TED hose) Bilateral lower extremities              -antiplatelet therapy: Brilinta 90 mg twice daily 3. Pain Management: Tylenol as needed             -  consider stating gabapentin for neuropathy 4. Mood/Behavior/Sleep: Provide emotional support.  Valium 5 mg daily Lexapro 20 mg daily, Depakote 500 mg twice daily             -antipsychotic agents: N/A 5. Neuropsych/cognition: This patient is not capable of making decisions on his own behalf. 6. Skin/Wound Care: Routine skin checks 7. Fluids/Electrolytes/Nutrition: Routine in and outs with follow-up chemistries 8.  Seizure disorder/epilepsy.  EEG negative.  Keppra 1000 mg every 12 hours, Depakote 500 mg twice a day 9.  Diabetes mellitus.  Currently SSI.  Patient on Lantus insulin 40 units nightly and Amaryl 2 mg daily prior to admission.  Resume as needed.  -CBGs have been labile, if continues consider decreasing SSI scale 10.  Hyperlipidemia.  Lipitor '80mg'$  daily 11.  CAD with CABG 2017.  Continue Imdur 30 mg daily.  No chest pain or shortness of breath. 12.  History of hypertension.  Patient on Norvasc 10 mg daily, Lopressor 12.5 mg twice daily, lisinopril 5 mg daily, BP well controlled overall 13.  COVID-19.  Resolved.  Precautions have been discontinued       Cathlyn Parsons, PA-C 08/31/2022   I have personally performed a face to face diagnostic evaluation of this patient and formulated the key components of the plan.  Additionally, I have personally reviewed laboratory data, imaging studies, as well as relevant notes and concur with the physician assistant's documentation above.   The patient's status has not changed from the original H&P.  Any changes in documentation from the acute care chart have been noted above.   Jennye Boroughs, MD, Mell[facility] Drown

## 2022-08-31 NOTE — H&P (Signed)
Physical Medicine and Rehabilitation Admission H&P     HPI: Melvin Taylor is a 57 year old right-handed male with history of bipolar disorder/schizophrenia, CAD/CABG, diabetes mellitus, hypertension, polysubstance abuse, OSA and will not use CPAP and seizure disorder//epilepsy maintained on Depakote as well as Lamictal as well as left MCA infarction with stenting maintained on Brilinta as well as Plavix receiving inpatient rehab services 12/21/2020 - 12/30/2020.  He was discharged to home ambulating 150 feet without assistive device contact-guard.  Per chart review patient lives alone.  1 level apartment with elevator.  Has a home health aide 53 hours a week to assist with making meals dressing bathing managing medications.  Sister does finances and his POA.  Presented 08/27/2022 from outside hospital after admission 1/20-27 with altered mental status.  He was noted to have persistent punctate focus of restricted diffusion in the left temporal medial lobe concerning for acute/subacute CVA on imaging at outside hospital.  MRI showed trace SDH that developed during the hospitalization 4 mm in the left occipital region.  He was transferred to [facility].  Cranial CT scan showed stable trace subdural hematoma along the posterior falx and posteriorly occipital lobe.  Stable remote infarcts of the left basal ganglia, anterior left frontal lobe and corona radiata but no acute CVA noted.  Stable left MCA stent.  Admission chemistries unremarkable except potassium 3.2, BUN 39, hemoglobin 9.2, valproic acid level 66.  Neurology follow-up patient currently remains on Brilinta as prior to admission but Plavix on hold.  Neurology felt that  risk of stent restenosis if there is prolonged cessation of Brilinta would most likely outweigh the risk of hemorrhage.  Neurosurgery signed off due to small size and stable appearance of subdural hematoma.  EEG negative for seizure currently remains on Keppra which had  been started at outside hospital.  His Depakote and Lamictal prior to admission are currently on hold.  Echocardiogram ejection fraction of 60 to 65% no wall motion abnormalities grade 1 diastolic dysfunction.  Hospital course COVID questionably incidental finding patient asymptomatic and completed antiviral/steroid course and maintained on airborne contact precautions.  He has completed his quarantine today.  Currently on dysphagia #2 nectar thick liquid diet.  Patient reports pain in his legs however he is unable to provide many details regarding this issue.  When asked he does admit to history of diabetes with polyneuropathy.  Therapy evaluations completed due to patient decreased functional mobility altered mental status/dysphagia was admitted for a comprehensive rehab program.  Review of Systems  Constitutional:  Positive for malaise/fatigue. Negative for chills and fever.  HENT:  Negative for hearing loss.   Eyes:  Negative for blurred vision and double vision.  Respiratory:  Negative for shortness of breath and wheezing.   Cardiovascular:  Positive for leg swelling. Negative for chest pain and palpitations.  Gastrointestinal:  Positive for constipation. Negative for heartburn, nausea and vomiting.  Genitourinary:  Negative for dysuria, flank pain and hematuria.  Musculoskeletal:  Positive for back pain, joint pain, myalgias and neck pain.  Skin:  Negative for rash.  Neurological:  Positive for seizures and headaches.  Psychiatric/Behavioral:  The patient has insomnia.        Schizophrenia/bipolar disorder  All other systems reviewed and are negative.  Past Medical History:  Diagnosis Date   Bipolar 1 disorder (HCC)    depression/anxiety   Bone spur    left heel   Cervical radiculopathy    Chronic back pain    Chronic chest  wall pain    Chronic neck pain    Coronary artery disease    a. s/p CABG in 03/2016 with LIMA-LAD, SVG-D1, SVG-RCA, and Seq SVG-mid and distal OM   Diabetes  mellitus    Type II   Hallucinations    "long history of them"   Headache(784.0)    History of gout    HTN (hypertension)    Insomnia    Neuropathy    Pain management    Polysubstance abuse ([place])    Right leg pain    chronic   Schizophrenia ([place])    Seizures ([place])    last sz between July 5-9th, 2016; epilepsy   Sleep apnea    Stroke ([place])    Transfusion of blood product refused for religious reason    Past Surgical History:  Procedure Laterality Date   ANTERIOR CERVICAL DECOMP/DISCECTOMY FUSION N/A 12/14/2015   Procedure: ANTERIOR CERVICAL DECOMPRESSION/DISCECTOMY FUSION CERVICAL FIVE -SIX;  Surgeon: Earnie Larsson, MD;  Location: [place] [facility];  Service: Neurosurgery;  Laterality: N/A;   BIOPSY  05/07/2015   Procedure: BIOPSY (Gastric);  Surgeon: [name], MD;  [facility]: [facility];  Service: Endoscopy;;   CARDIAC CATHETERIZATION N/A 03/07/2016   Procedure: Left Heart Cath and Coronary Angiography;  Surgeon: Belva Crome, MD;  Location: [place] [facility];  Service: Cardiovascular;  Laterality: N/A;   COLONOSCOPY WITH PROPOFOL N/A 05/07/2015   [id number] diverticulosis, multiple colon polyps removed, tubular adenoma, serrated colon polyp. Next colonoscopy October 2019   COLONOSCOPY WITH PROPOFOL N/A 08/02/2018   Procedure: COLONOSCOPY WITH PROPOFOL;  Surgeon: [name], MD;  [facility];  Service: Endoscopy;  Laterality: N/A;  12:00pm   CORONARY ARTERY BYPASS GRAFT N/A 03/28/2016   Procedure: CORONARY ARTERY BYPASS GRAFTING (CABG) x 5 USING GREATER SAPHENOUS VEIN;  Surgeon: [name], MD;  Location: [place] [facility];  Service: Open Heart Surgery;  Laterality: N/A;   ENDOVEIN HARVEST OF GREATER SAPHENOUS VEIN Right 03/28/2016   Procedure: ENDOVEIN HARVEST OF GREATER SAPHENOUS VEIN;  Surgeon: [name], MD;  Location: [place];  Service: Open Heart Surgery;  Laterality: Right;   ESOPHAGEAL DILATION N/A 05/07/2015   Procedure: ESOPHAGEAL DILATION WITH 56FR MALONEY DILATOR;   Surgeon: [name], MD;  [facility];  Service: Endoscopy;  Laterality: N/A;   ESOPHAGOGASTRODUODENOSCOPY (EGD) WITH PROPOFOL N/A 05/07/2015   RMR: Status post dilation of normal esophagus. Gastritis.   IR CT HEAD LTD  12/17/2020   IR CT HEAD LTD  12/17/2020   IR INTRA CRAN STENT  12/17/2020   IR PERCUTANEOUS ART THROMBECTOMY/INFUSION INTRACRANIAL INC DIAG ANGIO  12/17/2020   IR RADIOLOGIST EVAL & MGMT  03/25/2021   KNEE SURGERY Left    arthroscopy   MANDIBLE FRACTURE SURGERY     POLYPECTOMY  05/07/2015   Procedure: POLYPECTOMY (Hepatic Flexure, Distal Transverse Colon, Rectal);  Surgeon: [name], MD;  [facility];  Service: Endoscopy;;   RADIOLOGY WITH ANESTHESIA N/A 12/16/2020   Procedure: IR WITH ANESTHESIA;  Surgeon: Luanne Bras, MD;  Location: [place];  Service: Radiology;  Laterality: N/A;   TEE WITHOUT CARDIOVERSION N/A 03/28/2016   Procedure: TRANSESOPHAGEAL ECHOCARDIOGRAM (TEE);  Surgeon: [name], MD;  Location: [place];  Service: Open Heart Surgery;  Laterality: N/A;   TONSILLECTOMY     TRIGGER FINGER RELEASE Left 10/15/2021   Procedure: RELEASE TRIGGER FINGER/A-1 PULLEY left middle or long finger;  Surgeon: Carole Civil, MD;  [facility];  Service: Orthopedics;  Laterality: Left;   Family History  Problem Relation Age of Onset   Diabetes Father    Hypertension Father    Sleep apnea Father    Stroke Sister    Arthritis Other    Diabetes Other    Asthma Other    Social History:  reports that he has quit smoking. His smoking use included cigarettes. He has a 15.00 pack-year smoking history. He has never used smokeless tobacco. He reports that he does not currently use alcohol. He reports that he does not currently use drugs after having used the following drugs: Marijuana. Allergies:  Allergies  Allergen Reactions   Aspirin Swelling and Other (See Comments)    Facial swelling    Medications Prior to Admission  Medication Sig Dispense  Refill   albuterol (PROVENTIL HFA;VENTOLIN HFA) 108 (90 Base) MCG/ACT inhaler Inhale 2 puffs into the lungs every 4 (four) hours as needed for wheezing or shortness of breath.      allopurinol (ZYLOPRIM) 100 MG tablet Take 100 mg by mouth daily.     amLODipine (NORVASC) 10 MG tablet Take 1 tablet (10 mg total) by mouth daily. (Patient taking differently: Take 10 mg by mouth daily. Hold BP <110, and/or MAP <70) 90 tablet 0   ascorbic acid (VITAMIN C) 1000 MG tablet Take 1,000 mg by mouth daily.     atorvastatin (LIPITOR) 80 MG tablet Take 1 tablet (80 mg total) by mouth at bedtime. (Patient taking differently: Take 40 mg by mouth at bedtime.) 30 tablet 0   [EXPIRED] azithromycin (ZITHROMAX) 500 MG tablet Take 500 mg by mouth daily. For 7 days     [EXPIRED] cefTRIAXone (ROCEPHIN) IVPB Inject 1 g into the vein daily. x7 days     cholecalciferol (VITAMIN D3) 25 MCG (1000 UNIT) tablet Take 1,000 Units by mouth daily.     clopidogrel (PLAVIX) 75 MG tablet Take 75 mg by mouth daily.     [EXPIRED] DEXAMETHASONE SOD PHOS-NACL IV Inject 6 mg into the vein daily. x7 days.     diazepam (VALIUM) 5 MG tablet Take 5 mg by mouth daily.     divalproex (DEPAKOTE) 500 MG DR tablet Take 1 tablet (500 mg total) by mouth 2 (two) times daily. (Patient taking differently: Take 750 mg by mouth 2 (two) times daily.) 60 tablet 11   docusate sodium (COLACE) 100 MG capsule Take 100 mg by mouth 2 (two) times daily.     escitalopram (LEXAPRO) 20 MG tablet Take 20 mg by mouth daily.      famotidine (PEPCID) 20 MG tablet Take 20 mg by mouth 2 (two) times daily.     ferrous sulfate 325 (65 FE) MG tablet Take 325 mg by mouth daily.     heparin 5000 UNIT/ML injection Inject 5,000 Units into the skin every 12 (twelve) hours.     insulin lispro (HUMALOG) 100 UNIT/ML injection Inject 0-20 Units into the skin 4 (four) times daily. Per sliding scale     isosorbide mononitrate (IMDUR) 30 MG 24 hr tablet Take 1 tablet (30 mg total) by  mouth daily. (Patient taking differently: Take 60 mg by mouth daily. Hold SBP <110 or MAP <70) 90 tablet 3   lactated ringers infusion Inject 100 mL/hr into the vein continuous.     levETIRAcetam (KEPPRA) 1000 MG tablet Take 1,000 mg by mouth 2 (two) times daily.     lisinopril (ZESTRIL) 5 MG tablet Take 5 mg by mouth daily. Hold for SBP <  110 and/or MAP < 70 and/or potassium in the past 24 hours > 5.5 and/or SCr in the past 24 hours > 2.5.     metoprolol tartrate (LOPRESSOR) 25 MG tablet Take 12.5 mg by mouth 2 (two) times daily. Hold for: HR < 60 and/or SBP < 110 and/or MAP < 70     Sodium Zirconium Cyclosilicate (LOKELMA PO) Take 1 tablet by mouth in the morning and at bedtime.     Blood Glucose Monitoring Suppl (ACCU-CHEK GUIDE ME) w/Device KIT 1 Piece by Does not apply route as directed. 1 kit 0   glucose blood (ACCU-CHEK GUIDE) test strip Monitor glucose 2 times a day. 150 each 2   ticagrelor (BRILINTA) 90 MG TABS tablet Take 1 tablet (90 mg total) by mouth 2 (two) times daily. (Patient not taking: Reported on 08/28/2022) 60 tablet 5   Vitamin D, Ergocalciferol, (DRISDOL) 1.25 MG (50000 UNIT) CAPS capsule Take 50,000 Units by mouth every 7 (seven) days. (Patient not taking: Reported on 08/28/2022)        Home: Home Living Family/patient expects to be discharged to:: Private residence Living Arrangements: Other (Comment) Available Help at Discharge: Family, Available 24 hours/day Type of Home: Apartment Home Access: Elevator Home Layout: Other (Comment) (2nd floor with elevator) Bathroom Shower/Tub: Chiropodist: Standard Bathroom Accessibility: Yes Home Equipment: Conservation officer, nature (2 wheels), [employer] - single point, Civil engineer, contracting, Hand held shower head Additional Comments: info provided by sister  Lives With: Family   Functional History: Prior Function Prior Level of Function : Needs assist Mobility Comments: Pt. went out for appts ADLs Comments: home health aide to  assist with making meals, dressing, bathing, manages medications, sisters does finances and is his POA  Functional Status:  Mobility: Bed Mobility Overal bed mobility: Needs Assistance Bed Mobility: Supine to Sit Supine to sit: Mod assist General bed mobility comments: Pt initiating well, exiting towards left side of bed, assist at trunk to bring upright Transfers Overall transfer level: Needs assistance Equipment used: 1 person hand held assist, Rolling walker (2 wheels) Transfers: Sit to/from Stand, Bed to chair/wheelchair/BSC Sit to Stand: Mod assist Bed to/from chair/wheelchair/BSC transfer type:: Stand pivot Stand pivot transfers: Min assist Step pivot transfers: Max assist, +2 physical assistance General transfer comment: Cues for pushing off with left hand, modA to power up from edge of bed. On 2nd trial, pt holding onto RW with LUE in order to stabilize to provide peri care. On 3rd standing trial, pt pivoting to left with minA, decreased R foot clearance, cues for reaching back with LUE Ambulation/Gait Ambulation/Gait assistance: Mod assist, +2 safety/equipment, +2 physical assistance Gait Distance (Feet): 3 Feet Assistive device: 2 person hand held assist Gait Pattern/deviations: Step-to pattern, Decreased stride length, Shuffle, Decreased weight shift to right General Gait Details: Increased R foot external rotation, decreased bilateral foot clearance, modA for standing balance and close chair follow Gait velocity: decreased Gait velocity interpretation: <1.31 ft/sec, indicative of household ambulator    ADL: ADL Overall ADL's : Needs assistance/impaired Eating/Feeding: Moderate assistance, Sitting Grooming: Moderate assistance, Sitting Upper Body Bathing: Maximal assistance, Sitting Lower Body Bathing: Maximal assistance, +2 for safety/equipment, +2 for physical assistance, Sit to/from stand Upper Body Dressing : Maximal assistance, Sitting Lower Body Dressing: Maximal  assistance, +2 for safety/equipment, +2 for physical assistance, Sit to/from stand Toilet Transfer: +2 for physical assistance, +2 for safety/equipment, Maximal assistance Toileting- Clothing Manipulation and Hygiene: Maximal assistance, +2 for physical assistance, +2 for safety/equipment, Sit to/from stand Functional mobility  during ADLs: +2 for physical assistance, +2 for safety/equipment, Maximal assistance General ADL Comments: limited by L hemibody impairments, cog, communication  Cognition: Cognition Overall Cognitive Status: Impaired/Different from baseline Orientation Level: Oriented to person, Disoriented to place, Disoriented to time, Disoriented to situation Cognition Arousal/Alertness: Awake/alert Behavior During Therapy: Flat affect Overall Cognitive Status: Impaired/Different from baseline Area of Impairment: Attention, Memory, Following commands, Safety/judgement, Awareness, Problem solving Orientation Level: Disoriented to, Place, Time, Situation Current Attention Level: Sustained Memory: Decreased short-term memory Following Commands: Follows one step commands with increased time Safety/Judgement: Decreased awareness of safety, Decreased awareness of deficits Awareness: Intellectual Problem Solving: Slow processing, Difficulty sequencing, Requires verbal cues, Requires tactile cues General Comments: per sister, [name], pt normally able to hold a conversation, pt now with delayed response time and one word answers  Physical Exam: Blood pressure (!) 150/95, pulse 60, temperature 98.9 F (37.2 C), temperature source Oral, resp. rate 15, height '5\' 5"'$  (1.651 m), weight 52.9 kg, SpO2 100 %. Physical Exam   General:  No apparent distress HEENT: Head is normocephalic, atraumatic, PERRLA, EOMI, sclera anicteric, oral mucosa pink and moist Neck: Supple without JVD or lymphadenopathy Heart: Reg rate and rhythm. No murmurs rubs or gallops Chest: CTA bilaterally without wheezes,  rales, or rhonchi; no distress Abdomen: Soft, non-tender, non-distended, bowel sounds positive. Extremities: No clubbing, cyanosis, or edema. Pulses are 2+ Psych: Very flat Skin: Clean and intact without signs of breakdown Neuro: Alert and oriented to person only.  He knows he is in the hospital but is unable to provide which hospital or him city.  He does not know the date.  Unable to explain why he is in the hospital.  Follows commands intermittently with a lot of encouragement, CN2-12 intact-difficult to assess facial movements due to command following, appears to have left gaze preference however he was able to cross midline after several attempts, he was able to name 2 objects, unable to repeat, normal verbal output, usually responds with nodding or shaking his head or "yep", very delayed responses, phonation appears normal Strength LUE and LLE at least 4 out of 5, limited by effort RUE 1 out of 5 proximally to 2 out of 5 distally, appears this may be limited by effort RLE able to lift to gravity, ankle DF 1 out of 5, MMT appears to be limited by effort and command following DTR 2+ throughout Sensation to LT intact in all 4 extremities Finger-nose intact on the left Musculoskeletal: No joint swelling noted, normal tone noted, appears to have intermittent tenderness throughout bilateral upper extremities and lower extremities   Results for orders placed or performed during the hospital encounter of 08/27/22 (from the past 48 hour(s))  Glucose, capillary     Status: Abnormal   Collection Time: 08/29/22 12:02 PM  Result Value Ref Range   Glucose-Capillary 109 (H) 70 - 99 mg/dL    Comment: Glucose reference range applies only to samples taken after fasting for at least 8 hours.   Comment 1 Notify RN    Comment 2 Document in Chart   Glucose, capillary     Status: Abnormal   Collection Time: 08/29/22  3:48 PM  Result Value Ref Range   Glucose-Capillary 230 (H) 70 - 99 mg/dL    Comment:  Glucose reference range applies only to samples taken after fasting for at least 8 hours.   Comment 1 Notify RN    Comment 2 Document in Chart   Glucose, capillary     Status: Abnormal  Collection Time: 08/29/22  9:57 PM  Result Value Ref Range   Glucose-Capillary 264 (H) 70 - 99 mg/dL    Comment: Glucose reference range applies only to samples taken after fasting for at least 8 hours.  Glucose, capillary     Status: Abnormal   Collection Time: 08/30/22  5:39 AM  Result Value Ref Range   Glucose-Capillary 63 (L) 70 - 99 mg/dL    Comment: Glucose reference range applies only to samples taken after fasting for at least 8 hours.  Glucose, capillary     Status: Abnormal   Collection Time: 08/30/22  6:07 AM  Result Value Ref Range   Glucose-Capillary 115 (H) 70 - 99 mg/dL    Comment: Glucose reference range applies only to samples taken after fasting for at least 8 hours.  Glucose, capillary     Status: Abnormal   Collection Time: 08/30/22 11:45 AM  Result Value Ref Range   Glucose-Capillary 179 (H) 70 - 99 mg/dL    Comment: Glucose reference range applies only to samples taken after fasting for at least 8 hours.  Glucose, capillary     Status: Abnormal   Collection Time: 08/30/22  5:41 PM  Result Value Ref Range   Glucose-Capillary 316 (H) 70 - 99 mg/dL    Comment: Glucose reference range applies only to samples taken after fasting for at least 8 hours.   Comment 1 Notify RN    Comment 2 Document in Chart   Glucose, capillary     Status: Abnormal   Collection Time: 08/30/22  9:12 PM  Result Value Ref Range   Glucose-Capillary 104 (H) 70 - 99 mg/dL    Comment: Glucose reference range applies only to samples taken after fasting for at least 8 hours.   Comment 1 Notify RN    Comment 2 Document in Chart   Glucose, capillary     Status: None   Collection Time: 08/31/22  7:01 AM  Result Value Ref Range   Glucose-Capillary 73 70 - 99 mg/dL    Comment: Glucose reference range applies  only to samples taken after fasting for at least 8 hours.   DG Swallowing Func-Speech Pathology  Result Date: 08/30/2022 Table formatting from the original result was not included. Objective Swallowing Evaluation: Type of Study: MBS-Modified Barium Swallow Study  Patient Details Name: Melvin Taylor MRN: [id number] Date of Birth: February 24, 1966 Today's Date: 08/30/2022 Time: SLP Start Time (ACUTE ONLY): 9030 -SLP Stop Time (ACUTE ONLY): 0923 SLP Time Calculation (min) (ACUTE ONLY): 20 min Past Medical History: Past Medical History: Diagnosis Date  Bipolar 1 disorder ([place])   depression/anxiety  Bone spur   left heel  Cervical radiculopathy   Chronic back pain   Chronic chest wall pain   Chronic neck pain   Coronary artery disease   a. s/p CABG in 03/2016 with LIMA-LAD, SVG-D1, SVG-RCA, and Seq SVG-mid and distal OM  Diabetes mellitus   Type II  Hallucinations   "long history of them"  Headache(784.0)   History of gout   HTN (hypertension)   Insomnia   Neuropathy   Pain management   Polysubstance abuse ([place])   Right leg pain   chronic  Schizophrenia ([place])   Seizures ([place])   last sz between July 5-9th, 2016; epilepsy  Sleep apnea   Stroke ([place])   Transfusion of blood product refused for religious reason  Past Surgical History: Past Surgical History: Procedure Laterality Date  ANTERIOR CERVICAL DECOMP/DISCECTOMY FUSION N/A 12/14/2015  Procedure: ANTERIOR  CERVICAL DECOMPRESSION/DISCECTOMY FUSION CERVICAL FIVE -SIX;  Surgeon: Earnie Larsson, MD;  Location: [place] [facility];  Service: Neurosurgery;  Laterality: N/A;  BIOPSY  05/07/2015  Procedure: BIOPSY (Gastric);  Surgeon: [name], MD;  [facility];  Service: Endoscopy;;  CARDIAC CATHETERIZATION N/A 03/07/2016  Procedure: Left Heart Cath and Coronary Angiography;  Surgeon: Belva Crome, MD;  Location: [place] [facility];  Service: Cardiovascular;  Laterality: N/A;  COLONOSCOPY WITH PROPOFOL N/A 05/07/2015  GUR:[id number] diverticulosis, multiple colon polyps removed,  tubular adenoma, serrated colon polyp. Next colonoscopy October 2019  COLONOSCOPY WITH PROPOFOL N/A 08/02/2018  Procedure: COLONOSCOPY WITH PROPOFOL;  Surgeon: [name], MD;  [facility];  Service: Endoscopy;  Laterality: N/A;  12:00pm  CORONARY ARTERY BYPASS GRAFT N/A 03/28/2016  Procedure: CORONARY ARTERY BYPASS GRAFTING (CABG) x 5 USING GREATER SAPHENOUS VEIN;  Surgeon: [name], MD;  Location: [place] [facility];  Service: Open Heart Surgery;  Laterality: N/A;  ENDOVEIN HARVEST OF GREATER SAPHENOUS VEIN Right 03/28/2016  Procedure: ENDOVEIN HARVEST OF GREATER SAPHENOUS VEIN;  Surgeon: [name], MD;  Location: [place];  Service: Open Heart Surgery;  Laterality: Right;  ESOPHAGEAL DILATION N/A 05/07/2015  Procedure: ESOPHAGEAL DILATION WITH 56FR MALONEY DILATOR;  Surgeon: [name], MD;  [facility];  Service: Endoscopy;  Laterality: N/A;  ESOPHAGOGASTRODUODENOSCOPY (EGD) WITH PROPOFOL N/A 05/07/2015  RMR: Status post dilation of normal esophagus. Gastritis.  IR CT HEAD LTD  12/17/2020  IR CT HEAD LTD  12/17/2020  IR INTRA CRAN STENT  12/17/2020  IR PERCUTANEOUS ART THROMBECTOMY/INFUSION INTRACRANIAL INC DIAG ANGIO  12/17/2020  IR RADIOLOGIST EVAL & MGMT  03/25/2021  KNEE SURGERY Left   arthroscopy  MANDIBLE FRACTURE SURGERY    POLYPECTOMY  05/07/2015  Procedure: POLYPECTOMY (Hepatic Flexure, Distal Transverse Colon, Rectal);  Surgeon: [name], MD;  [facility];  Service: Endoscopy;;  RADIOLOGY WITH ANESTHESIA N/A 12/16/2020  Procedure: IR WITH ANESTHESIA;  Surgeon: Luanne Bras, MD;  Location: [place];  Service: Radiology;  Laterality: N/A;  TEE WITHOUT CARDIOVERSION N/A 03/28/2016  Procedure: TRANSESOPHAGEAL ECHOCARDIOGRAM (TEE);  Surgeon: [name], MD;  Location: [place];  Service: Open Heart Surgery;  Laterality: N/A;  TONSILLECTOMY    TRIGGER FINGER RELEASE Left 10/15/2021  Procedure: RELEASE TRIGGER FINGER/A-1 PULLEY left middle or long finger;  Surgeon: Carole Civil,  MD;  [facility];  Service: Orthopedics;  Laterality: Left; HPI: 57 y.o. male with medical history significant of bipolar d/o vs/ schizophrenia, chronic chest wall/neck/back pain, CAD s/p CABG, DM, HTN, polysubstance abuse, OSA, and seizure d/o presenting with AMS.  He was admitted to [facility] from 1/20-27 with AMS, minimally improving during hospitalization.  He was noted to have a persistent punctate focus of restricted diffusion in the L medial temporal lobe, concerning for acute/subacute CVA.  MRI/CT with SDH that developed during the hospitalization, 4 mm in L occipital region.  He has been on IV Keppra throughout the hospitalization without apparent seizure activity.  He is able to answer very limited questions, none about orientation.  He does follow some commands  Subjective: Alert, cooperative  Recommendations for follow up therapy are one component of a multi-disciplinary discharge planning process, led by the attending physician.  Recommendations may be updated based on patient status, additional functional criteria and insurance authorization. Assessment / Plan / Recommendation   08/30/2022   5:48 PM Clinical Impressions Clinical Impression Patient presents with a mild oral and a mild-moderate pharyngeal phase dysphagia as  per this MBS. During oral phase, patient exhibited weak lingual manipulation and mastication leading to delays in bolus formation and transit. During pharyngeal phase, patient with vallecular residuals with all consistencies as well as pyriform sinus residuals with honey thick, nectar thick and thin liquids. Residuals started to clear with subsequent swallows. No penetration or aspiration observed with teaspoon sips of nectar, thin or honey thick liquids. During consecutive cup sips with thin liquids and nectar thick liquids, patient with reduced epiglottic inversion leading to penetration above vocal cords. In addition, airway closure was adequate with nectar thick liquids even during  concecutive sips. Aspiration observed during the swallow with consecutive sips of thin liquids and although it was trace to mild in amount, patient did not sense. (silent aspiration). SLP recommending to continue with Dys 2 solids, nectar thick liquids at this time and will trial thin liquids at bedside. SLP Visit Diagnosis Dysphagia, oropharyngeal phase (R13.12) Impact on safety and function Mild aspiration risk;Moderate aspiration risk     08/30/2022   5:48 PM Treatment Recommendations Treatment Recommendations Therapy as outlined in treatment plan below     08/30/2022   5:55 PM Prognosis Prognosis for Safe Diet Advancement Good Barriers to Reach Goals Severity of deficits;Time post onset   08/30/2022   5:48 PM Diet Recommendations SLP Diet Recommendations Dysphagia 2 (Fine chop) solids;Nectar thick liquid Liquid Administration via Cup;Straw Medication Administration Whole meds with puree Compensations Slow rate;Small sips/bites;Minimize environmental distractions;Lingual sweep for clearance of pocketing Postural Changes Seated upright at 90 degrees     08/30/2022   5:48 PM Other Recommendations Oral Care Recommendations Oral care BID Other Recommendations Order thickener from pharmacy Follow Up Recommendations Acute inpatient rehab (3hours/day) Functional Status Assessment Patient has had a recent decline in their functional status and demonstrates the ability to make significant improvements in function in a reasonable and predictable amount of time.   08/30/2022   5:48 PM Frequency and Duration  Speech Therapy Frequency (ACUTE ONLY) min 2x/week Treatment Duration 2 weeks     08/30/2022   5:43 PM Oral Phase Oral Phase Impaired Oral - Honey Teaspoon Reduced posterior propulsion;Weak lingual manipulation Oral - Nectar Teaspoon Weak lingual manipulation;Reduced posterior propulsion Oral - Nectar Cup Weak lingual manipulation;Reduced posterior propulsion Oral - Thin Teaspoon Weak lingual manipulation;Holding of bolus Oral  - Thin Cup Premature spillage Oral - Puree Reduced posterior propulsion;Weak lingual manipulation Oral - Mech Soft Reduced posterior propulsion;Weak lingual manipulation;Piecemeal swallowing;Impaired mastication    08/30/2022   5:45 PM Pharyngeal Phase Pharyngeal Phase Impaired Pharyngeal- Honey Teaspoon Pharyngeal residue - valleculae;Pharyngeal residue - pyriform Pharyngeal- Nectar Teaspoon Pharyngeal residue - valleculae;Pharyngeal residue - pyriform Pharyngeal- Nectar Cup Delayed swallow initiation-pyriform sinuses;Reduced airway/laryngeal closure;Reduced epiglottic inversion;Penetration/Aspiration during swallow;Pharyngeal residue - valleculae;Pharyngeal residue - pyriform Pharyngeal Material enters airway, remains ABOVE vocal cords then ejected out;Material enters airway, remains ABOVE vocal cords and not ejected out Pharyngeal- Thin Teaspoon Pharyngeal residue - valleculae;Pharyngeal residue - pyriform Pharyngeal- Thin Cup Pharyngeal residue - valleculae;Pharyngeal residue - pyriform;Penetration/Aspiration during swallow;Reduced epiglottic inversion;Reduced airway/laryngeal closure Pharyngeal Material enters airway, passes BELOW cords without attempt by patient to eject out (silent aspiration) Pharyngeal- Puree Reduced pharyngeal peristalsis;Reduced tongue base retraction;Pharyngeal residue - valleculae;Pharyngeal residue - pyriform    08/30/2022   5:48 PM Cervical Esophageal Phase  Cervical Esophageal Phase Impaired Cervical Esophageal Comment barium retention above UES Sonia Baller, MA, CCC-SLP Speech Therapy                     EEG adult  Result Date: 08/29/2022 Lora Havens, MD     08/29/2022 10:26 AM Patient Name: CEASER EBELING MRN: [id number] Epilepsy Attending: Lora Havens Referring Physician/Provider: Katy Apo, NP Date: 08/28/2022 Duration: 21.18 mins Patient history: 57yo M with ams. EEG to evaluate for seizure Level of alertness: Awake AEDs during EEG study: None  Technical aspects: This EEG study was done with scalp electrodes positioned according to the 10-20 International system of electrode placement. Electrical activity was reviewed with band pass filter of 1-'70Hz'$ , sensitivity of 7 uV/mm, display speed of 7m/sec with a '60Hz'$  notched filter applied as appropriate. EEG data were recorded continuously and digitally stored.  Video monitoring was available and reviewed as appropriate. Description: EEG showed continuous generalized 3 to 6 Hz theta-delta slowing. Hyperventilation and photic stimulation were not performed.   ABNORMALITY - Continuous slow, generalized IMPRESSION: This study is suggestive of moderate to severe diffuse encephalopathy, nonspecific etiology. No seizures or epileptiform discharges were seen throughout the recording. Priyanka O Yadav      Blood pressure (!) 150/95, pulse 60, temperature 98.9 F (37.2 C), temperature source Oral, resp. rate 15, height '5\' 5"'$  (1.651 m), weight 52.9 kg, SpO2 100 %.  Medical Problem List and Plan: 1. Functional deficits secondary to acute metabolic encephalopathy.  Stable trace subdural hematoma along the posterior falx and posterior occipital lobe.  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  Received inpatient rehab services 12/21/2020 - 12/30/2020 after CVA  -patient may 16-18 days shower  -ELOS/Goals: PT/OT/SLP Supervision to MIN A  -Admit to CIR, evaluations tomorrow by PT OT and SLP 2.  Antithrombotics: -DVT/anticoagulation:  Mechanical:  Antiembolism stockings, knee (TED hose) Bilateral lower extremities  -antiplatelet therapy: Brilinta 90 mg twice daily 3. Pain Management: Tylenol as needed  -consider stating gabapentin for neuropathy 4. Mood/Behavior/Sleep: Provide emotional support.  Valium 5 mg daily Lexapro 20 mg daily, Depakote 500 mg twice daily  -antipsychotic agents: N/A 5. Neuropsych/cognition: This patient is  not capable of making decisions on his own behalf. 6. Skin/Wound Care: Routine skin checks 7. Fluids/Electrolytes/Nutrition: Routine in and outs with follow-up chemistries 8.  Seizure disorder/epilepsy.  EEG negative.  Keppra 1000 mg every 12 hours, Depakote 500 mg twice a day 9.  Diabetes mellitus.  Currently SSI.  Patient on Lantus insulin 40 units nightly and Amaryl 2 mg daily prior to admission.  Resume as needed.  -CBGs have been labile, if continues consider decreasing SSI scale 10.  Hyperlipidemia.  Lipitor '80mg'$  daily 11.  CAD with CABG 2017.  Continue Imdur 30 mg daily.  No chest pain or shortness of breath. 12.  History of hypertension.  Patient on Norvasc 10 mg daily, Lopressor 12.5 mg twice daily, lisinopril 5 mg daily, BP well controlled overall 13.  COVID-19.  Resolved.  Precautions have been discontinued    DCathlyn Parsons PA-C 08/31/2022  I have personally performed a face to face diagnostic evaluation of this patient and formulated the key components of the plan.  Additionally, I have personally reviewed laboratory data, imaging studies, as well as relevant notes and concur with the physician assistant's documentation above.  The patient's status has not changed from the original H&P.  Any changes in documentation from the acute care chart have been noted above.  YJennye Boroughs MD, FMellody Drown

## 2022-08-31 NOTE — Progress Notes (Signed)
Patient arrived on unit in bed escorted by NT x2;skin assessment completed by x2 RN;pt oriented to unit best as possible as only alert to self.

## 2022-08-31 NOTE — Discharge Summary (Signed)
Physician Discharge Summary  Melvin Taylor HEN:[id number] DOB: Dec 19, 1965 DOA: 08/27/2022  PCP: [name], FNP  Admit date: 08/27/2022 Discharge date: 08/31/2022 30 Day Unplanned Readmission Risk Score    Flowsheet Row Admission (Current) from 08/27/2022 in [facility]  30 Day Unplanned Readmission Risk Score (%) 14.3 Filed at 08/31/2022 0801       This score is the patient's risk of an unplanned readmission within 30 days of being discharged (0 -100%). The score is based on dignosis, age, lab data, medications, orders, and past utilization.   Low:  0-14.9   Medium: 15-21.9   High: 22-29.9   Extreme: 30 and above          Admitted From: Home Disposition: CIR  Recommendations for Outpatient Follow-up:  Follow up with PCP in 1-2 weeks Please obtain BMP/CBC in one week Follow-up with neurology in 4 to 6 weeks Please follow up with your PCP on the following pending results: Unresulted Labs (From admission, onward)    None         Home Health: None Equipment/Devices: None  Discharge Condition: Stable CODE STATUS: Full code Diet recommendation: Cardiac  Subjective: Seen and examined.  No complaints.  Brief/Interim Summary: Melvin Taylor is a 57 y.o. male with medical history significant of bipolar d/o vs/ schizophrenia, chronic chest wall/neck/back pain, CAD s/p CABG, DM, HTN, prior history of polysubstance abuse, OSA, and seizure, presented with AMS.  He was admitted to [facility] from 1/20-27 with newly diagnosed covid(completed treatment) and AMS, minimally improving during hospitalization.  Punctate CVA on imaging at that facility with subdural hematoma developing during that hospitalization, 4 mm in the left occipital region.  He continues on Keppra for seizure prophylaxis.  At baseline he is able to answer direct questions per family.  Otherwise lives alone with caretakers.  Given new findings of subdural hematoma he was transferred to our facility  for neurosurgery evaluation on 08/27/2022.   Acute metabolic encephalopathy, multifactorial, resolved - Recent CVA, SDH, recent covid infection and high dose steroids with hospitalization all likely contributing to mental status changes - Neurosurgery signed off given small size and stable subdural hematoma repeat CT head shows stable hematoma -Neurology recommended continue Brilinta in the setting of recent stenting. - Patient otherwise appears to be back to baseline today able to answer simple direct questions which was his baseline per signout.   SDH -Appears to have been spontaneous at [place] -Neurosurgery has signed off -Repeat CT head stable - resumed Brilinta as above   COVID, questionably incidental finding, resolved -Positive for COVID on 1/20, asymptomatic other than mental status changes which are likely due to other confounding issues as above. -Completed antiviral/steroid course without improvement in symptoms -There is no indication to retest as covid test will remain positive well past acute infection phase (upwards of 90 days in some patients).  He completed his quarantine today.  HTN Blood pressure rising, resume all home antihypertensives and he is comfortable CIR so MD over there will further adjust medications.   HLD -FLP unremarkable -Continue Lipitor 80 mg daily when able to take PO   DM -A1c was 5.0 on 1/21 Resume PTA medications.   CAD, history -s/p CABG -Hold hypertensive medications currently normotensive   Bipolar/schizophrenia  -Sister denies both diagnoses -h/o polysubstance abuse, in remission x 2 years (trigger finger surgery last year -> relapse for a brief period of time)   History of seizures -None since stent placement resume home dose of  Depakote and Keppra.   OSA -Does not wear CPAP, no machine at this time  Discharge plan was discussed with patient and/or family member and they verbalized understanding and agreed with it.   Discharge Diagnoses:  Principal Problem:   Acute CVA (cerebrovascular accident) [facility]) Active Problems:   DM type 2 causing vascular disease ([place])   Essential hypertension, benign   Mixed hyperlipidemia   CAD (coronary artery disease)   Subdural hematoma (HCC)   Seizure disorder (HCC)   OSA (obstructive sleep apnea)   Acute metabolic encephalopathy   Protein-calorie malnutrition, severe    Discharge Instructions   Allergies as of 08/31/2022       Reactions   Aspirin Swelling, Other (See Comments)   Facial swelling         Medication List     STOP taking these medications    azithromycin 500 MG tablet Commonly known as: ZITHROMAX   cefTRIAXone  IVPB Commonly known as: ROCEPHIN   clopidogrel 75 MG tablet Commonly known as: PLAVIX   DEXAMETHASONE SOD PHOS-NACL IV   lactated ringers infusion       TAKE these medications    Accu-Chek Guide Me w/Device Kit 1 Piece by Does not apply route as directed.   Accu-Chek Guide test strip Generic drug: glucose blood Monitor glucose 2 times a day.   albuterol 108 (90 Base) MCG/ACT inhaler Commonly known as: VENTOLIN HFA Inhale 2 puffs into the lungs every 4 (four) hours as needed for wheezing or shortness of breath.   allopurinol 100 MG tablet Commonly known as: ZYLOPRIM Take 100 mg by mouth daily.   amLODipine 10 MG tablet Commonly known as: NORVASC Take 1 tablet (10 mg total) by mouth daily. What changed: additional instructions   ascorbic acid 1000 MG tablet Commonly known as: VITAMIN C Take 1,000 mg by mouth daily.   atorvastatin 80 MG tablet Commonly known as: LIPITOR Take 1 tablet (80 mg total) by mouth at bedtime. What changed: how much to take   cholecalciferol 25 MCG (1000 UNIT) tablet Commonly known as: VITAMIN D3 Take 1,000 Units by mouth daily.   cycloSPORINE 0.05 % ophthalmic emulsion Commonly known as: RESTASIS Place 1 drop into both eyes 2 (two) times daily.   diazepam 5 MG  tablet Commonly known as: VALIUM Take 5 mg by mouth daily.   divalproex 500 MG DR tablet Commonly known as: DEPAKOTE Take 1 tablet (500 mg total) by mouth 2 (two) times daily. What changed: how much to take   docusate sodium 100 MG capsule Commonly known as: COLACE Take 100 mg by mouth 2 (two) times daily.   escitalopram 20 MG tablet Commonly known as: LEXAPRO Take 20 mg by mouth daily.   famotidine 20 MG tablet Commonly known as: PEPCID Take 20 mg by mouth 2 (two) times daily.   ferrous sulfate 325 (65 FE) MG tablet Take 325 mg by mouth daily.   heparin 5000 UNIT/ML injection Inject 5,000 Units into the skin every 12 (twelve) hours.   insulin lispro 100 UNIT/ML injection Commonly known as: HUMALOG Inject 0-20 Units into the skin 4 (four) times daily. Per sliding scale   isosorbide mononitrate 30 MG 24 hr tablet Commonly known as: IMDUR Take 1 tablet (30 mg total) by mouth daily. What changed:  how much to take additional instructions   levETIRAcetam 1000 MG tablet Commonly known as: KEPPRA Take 1,000 mg by mouth 2 (two) times daily.   lisinopril 5 MG tablet Commonly known as: ZESTRIL Take  5 mg by mouth daily. Hold for SBP < 110 and/or MAP < 70 and/or potassium in the past 24 hours > 5.5 and/or SCr in the past 24 hours > 2.5.   LOKELMA PO Take 1 tablet by mouth in the morning and at bedtime.   metoprolol tartrate 25 MG tablet Commonly known as: LOPRESSOR Take 12.5 mg by mouth 2 (two) times daily. Hold for: HR < 60 and/or SBP < 110 and/or MAP < 70   ticagrelor 90 MG Tabs tablet Commonly known as: BRILINTA Take 1 tablet (90 mg total) by mouth 2 (two) times daily.   Vitamin D (Ergocalciferol) 1.25 MG (50000 UNIT) Caps capsule Commonly known as: DRISDOL Take 50,000 Units by mouth every 7 (seven) days.        Follow-up Information     [name], FNP Follow up in 1 week(s).   Specialty: Family Medicine Contact information: [place] [address] [phone]                Allergies  Allergen Reactions   Aspirin Swelling and Other (See Comments)    Facial swelling     Consultations: Neurology and neurosurgery   Procedures/Studies: DG Swallowing Func-Speech Pathology  Result Date: 08/30/2022 Table formatting from the original result was not included. Objective Swallowing Evaluation: Type of Study: MBS-Modified Barium Swallow Study  Patient Details Name: Melvin Taylor MRN: [id number] Date of Birth: 1966/07/29 Today's Date: 08/30/2022 Time: SLP Start Time (ACUTE ONLY): 2831 -SLP Stop Time (ACUTE ONLY): 5176 SLP Time Calculation (min) (ACUTE ONLY): 20 min Past Medical History: Past Medical History: Diagnosis Date  Bipolar 1 disorder ([place])   depression/anxiety  Bone spur   left heel  Cervical radiculopathy   Chronic back pain   Chronic chest wall pain   Chronic neck pain   Coronary artery disease   a. s/p CABG in 03/2016 with LIMA-LAD, SVG-D1, SVG-RCA, and Seq SVG-mid and distal OM  Diabetes mellitus   Type II  Hallucinations   "long history of them"  Headache(784.0)   History of gout   HTN (hypertension)   Insomnia   Neuropathy   Pain management   Polysubstance abuse ([place])   Right leg pain   chronic  Schizophrenia ([place])   Seizures ([place])   last sz between July 5-9th, 2016; epilepsy  Sleep apnea   Stroke ([place])   Transfusion of blood product refused for religious reason  Past Surgical History: Past Surgical History: Procedure Laterality Date  ANTERIOR CERVICAL DECOMP/DISCECTOMY FUSION N/A 12/14/2015  Procedure: ANTERIOR CERVICAL DECOMPRESSION/DISCECTOMY FUSION CERVICAL FIVE -SIX;  Surgeon: Earnie Larsson, MD;  Location: [facility];  Service: Neurosurgery;  Laterality: N/A;  BIOPSY  05/07/2015  Procedure: BIOPSY (Gastric);  Surgeon: [name], MD;  Location: [facility];  Service: Endoscopy;;  CARDIAC CATHETERIZATION N/A 03/07/2016  Procedure: Left Heart Cath and Coronary Angiography;  Surgeon: Belva Crome, MD;   Location: [place] [facility];  Service: Cardiovascular;  Laterality: N/A;  COLONOSCOPY WITH PROPOFOL N/A 05/07/2015  HYW:VPXTGGYIRS diverticulosis, multiple colon polyps removed, tubular adenoma, serrated colon polyp. Next colonoscopy October 2019  COLONOSCOPY WITH PROPOFOL N/A 08/02/2018  Procedure: COLONOSCOPY WITH PROPOFOL;  Surgeon: [name], MD;  Location: [facility];  Service: Endoscopy;  Laterality: N/A;  12:00pm  CORONARY ARTERY BYPASS GRAFT N/A 03/28/2016  Procedure: CORONARY ARTERY BYPASS GRAFTING (CABG) x 5 USING GREATER SAPHENOUS VEIN;  Surgeon: [name], MD;  Location: [facility];  Service: Open Heart  Surgery;  Laterality: N/A;  ENDOVEIN HARVEST OF GREATER SAPHENOUS VEIN Right 03/28/2016  Procedure: ENDOVEIN HARVEST OF GREATER SAPHENOUS VEIN;  Surgeon: [name], MD;  Location: [place];  Service: Open Heart Surgery;  Laterality: Right;  ESOPHAGEAL DILATION N/A 05/07/2015  Procedure: ESOPHAGEAL DILATION WITH 56FR MALONEY DILATOR;  Surgeon: [name], MD;  Location: [facility];  Service: Endoscopy;  Laterality: N/A;  ESOPHAGOGASTRODUODENOSCOPY (EGD) WITH PROPOFOL N/A 05/07/2015  RMR: Status post dilation of normal esophagus. Gastritis.  IR CT HEAD LTD  12/17/2020  IR CT HEAD LTD  12/17/2020  IR INTRA CRAN STENT  12/17/2020  IR PERCUTANEOUS ART THROMBECTOMY/INFUSION INTRACRANIAL INC DIAG ANGIO  12/17/2020  IR RADIOLOGIST EVAL & MGMT  03/25/2021  KNEE SURGERY Left   arthroscopy  MANDIBLE FRACTURE SURGERY    POLYPECTOMY  05/07/2015  Procedure: POLYPECTOMY (Hepatic Flexure, Distal Transverse Colon, Rectal);  Surgeon: [name], MD;  Location: [facility];  Service: Endoscopy;;  RADIOLOGY WITH ANESTHESIA N/A 12/16/2020  Procedure: IR WITH ANESTHESIA;  Surgeon: Luanne Bras, MD;  Location: [place];  Service: Radiology;  Laterality: N/A;  TEE WITHOUT CARDIOVERSION N/A 03/28/2016  Procedure: TRANSESOPHAGEAL ECHOCARDIOGRAM (TEE);  Surgeon: [name], MD;  Location: [place];  Service: Open Heart Surgery;   Laterality: N/A;  TONSILLECTOMY    TRIGGER FINGER RELEASE Left 10/15/2021  Procedure: RELEASE TRIGGER FINGER/A-1 PULLEY left middle or long finger;  Surgeon: Carole Civil, MD;  Location: [facility];  Service: Orthopedics;  Laterality: Left; HPI: 57 y.o. male with medical history significant of bipolar d/o vs/ schizophrenia, chronic chest wall/neck/back pain, CAD s/p CABG, DM, HTN, polysubstance abuse, OSA, and seizure d/o presenting with AMS.  He was admitted to [facility] from 1/20-27 with AMS, minimally improving during hospitalization.  He was noted to have a persistent punctate focus of restricted diffusion in the L medial temporal lobe, concerning for acute/subacute CVA.  MRI/CT with SDH that developed during the hospitalization, 4 mm in L occipital region.  He has been on IV Keppra throughout the hospitalization without apparent seizure activity.  He is able to answer very limited questions, none about orientation.  He does follow some commands  Subjective: Alert, cooperative  Recommendations for follow up therapy are one component of a multi-disciplinary discharge planning process, led by the attending physician.  Recommendations may be updated based on patient status, additional functional criteria and insurance authorization. Assessment / Plan / Recommendation   08/30/2022   5:48 PM Clinical Impressions Clinical Impression Patient presents with a mild oral and a mild-moderate pharyngeal phase dysphagia as per this MBS. During oral phase, patient exhibited weak lingual manipulation and mastication leading to delays in bolus formation and transit. During pharyngeal phase, patient with vallecular residuals with all consistencies as well as pyriform sinus residuals with honey thick, nectar thick and thin liquids. Residuals started to clear with subsequent swallows. No penetration or aspiration observed with teaspoon sips of nectar, thin or honey thick liquids. During consecutive cup sips with thin liquids and nectar  thick liquids, patient with reduced epiglottic inversion leading to penetration above vocal cords. In addition, airway closure was adequate with nectar thick liquids even during concecutive sips. Aspiration observed during the swallow with consecutive sips of thin liquids and although it was trace to mild in amount, patient did not sense. (silent aspiration). SLP recommending to continue with Dys 2 solids, nectar thick liquids at this time and will trial thin liquids at bedside. SLP Visit Diagnosis Dysphagia, oropharyngeal phase (R13.12) Impact on safety  and function Mild aspiration risk;Moderate aspiration risk     08/30/2022   5:48 PM Treatment Recommendations Treatment Recommendations Therapy as outlined in treatment plan below     08/30/2022   5:55 PM Prognosis Prognosis for Safe Diet Advancement Good Barriers to Reach Goals Severity of deficits;Time post onset   08/30/2022   5:48 PM Diet Recommendations SLP Diet Recommendations Dysphagia 2 (Fine chop) solids;Nectar thick liquid Liquid Administration via Cup;Straw Medication Administration Whole meds with puree Compensations Slow rate;Small sips/bites;Minimize environmental distractions;Lingual sweep for clearance of pocketing Postural Changes Seated upright at 90 degrees     08/30/2022   5:48 PM Other Recommendations Oral Care Recommendations Oral care BID Other Recommendations Order thickener from pharmacy Follow Up Recommendations Acute inpatient rehab (3hours/day) Functional Status Assessment Patient has had a recent decline in their functional status and demonstrates the ability to make significant improvements in function in a reasonable and predictable amount of time.   08/30/2022   5:48 PM Frequency and Duration  Speech Therapy Frequency (ACUTE ONLY) min 2x/week Treatment Duration 2 weeks     08/30/2022   5:43 PM Oral Phase Oral Phase Impaired Oral - Honey Teaspoon Reduced posterior propulsion;Weak lingual manipulation Oral - Nectar Teaspoon Weak lingual  manipulation;Reduced posterior propulsion Oral - Nectar Cup Weak lingual manipulation;Reduced posterior propulsion Oral - Thin Teaspoon Weak lingual manipulation;Holding of bolus Oral - Thin Cup Premature spillage Oral - Puree Reduced posterior propulsion;Weak lingual manipulation Oral - Mech Soft Reduced posterior propulsion;Weak lingual manipulation;Piecemeal swallowing;Impaired mastication    08/30/2022   5:45 PM Pharyngeal Phase Pharyngeal Phase Impaired Pharyngeal- Honey Teaspoon Pharyngeal residue - valleculae;Pharyngeal residue - pyriform Pharyngeal- Nectar Teaspoon Pharyngeal residue - valleculae;Pharyngeal residue - pyriform Pharyngeal- Nectar Cup Delayed swallow initiation-pyriform sinuses;Reduced airway/laryngeal closure;Reduced epiglottic inversion;Penetration/Aspiration during swallow;Pharyngeal residue - valleculae;Pharyngeal residue - pyriform Pharyngeal Material enters airway, remains ABOVE vocal cords then ejected out;Material enters airway, remains ABOVE vocal cords and not ejected out Pharyngeal- Thin Teaspoon Pharyngeal residue - valleculae;Pharyngeal residue - pyriform Pharyngeal- Thin Cup Pharyngeal residue - valleculae;Pharyngeal residue - pyriform;Penetration/Aspiration during swallow;Reduced epiglottic inversion;Reduced airway/laryngeal closure Pharyngeal Material enters airway, passes BELOW cords without attempt by patient to eject out (silent aspiration) Pharyngeal- Puree Reduced pharyngeal peristalsis;Reduced tongue base retraction;Pharyngeal residue - valleculae;Pharyngeal residue - pyriform    08/30/2022   5:48 PM Cervical Esophageal Phase  Cervical Esophageal Phase Impaired Cervical Esophageal Comment barium retention above UES Sonia Baller, MA, CCC-SLP Speech Therapy                     EEG adult  Result Date: 08/29/2022 Lora Havens, MD     08/29/2022 10:26 AM Patient Name: Melvin Taylor MRN: [id number] Epilepsy Attending: Lora Havens Referring Physician/Provider: Katy Apo, NP Date: 08/28/2022 Duration: 21.18 mins Patient history: 57yo M with ams. EEG to evaluate for seizure Level of alertness: Awake AEDs during EEG study: None Technical aspects: This EEG study was done with scalp electrodes positioned according to the 10-20 International system of electrode placement. Electrical activity was reviewed with band pass filter of 1-'70Hz'$ , sensitivity of 7 uV/mm, display speed of 77m/sec with a '60Hz'$  notched filter applied as appropriate. EEG data were recorded continuously and digitally stored.  Video monitoring was available and reviewed as appropriate. Description: EEG showed continuous generalized 3 to 6 Hz theta-delta slowing. Hyperventilation and photic stimulation were not performed.   ABNORMALITY - Continuous slow, generalized IMPRESSION: This study is suggestive of moderate to severe diffuse encephalopathy, nonspecific  etiology. No seizures or epileptiform discharges were seen throughout the recording. [place]   CT HEAD WO CONTRAST (5MM)  Result Date: 08/28/2022 CLINICAL DATA:  Stroke, hemorrhagic EXAM: CT HEAD WITHOUT CONTRAST TECHNIQUE: Contiguous axial images were obtained from the base of the skull through the vertex without intravenous contrast. RADIATION DOSE REDUCTION: This exam was performed according to the departmental dose-optimization program which includes automated exposure control, adjustment of the mA and/or kV according to patient size and/or use of iterative reconstruction technique. COMPARISON:  CTA head and neck 08/26/2022. MR head without and with contrast 08/25/2022 at [place]: Brain: Trace subdural hematoma along the posterior falx and posterior the occipital lobe is stable. No new hemorrhage is present. Remote infarcts of the left basal ganglia, anterior left frontal lobe and corona radiata are stable. No acute infarct is present. Ex vacuo dilation of the left lateral ventricle is present. The brainstem and  cerebellum are within normal limits. Midline structures are within normal limits. Vascular: Left MCA stent is stable. Atherosclerotic calcifications are present within the cavernous internal carotid arteries. No other hyperdense vessel is present. Skull: Calvarium is intact. No focal lytic or blastic lesions are present. No significant extracranial soft tissue lesion is present. Sinuses/Orbits: A left mastoid effusion is present. The paranasal sinuses and mastoid air cells are otherwise clear. The globes and orbits are within normal limits. IMPRESSION: 1. Stable trace subdural hematoma along the posterior falx and posterior the occipital lobe. 2. No new hemorrhage. 3. 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 4. Stable left MCA stent. 5. Left mastoid effusion. The above was relayed via text pager to Dr. Kerney Elbe on 08/28/2022 at 13:31 . Electronically Signed   By: San Morelle M.D.   On: 08/28/2022 13:32   ECHOCARDIOGRAM COMPLETE  Result Date: 08/28/2022    ECHOCARDIOGRAM REPORT   Patient Name:   Melvin Taylor Date of Exam: 08/28/2022 Medical Rec #:  [id number]         Height:       65.0 in Accession #:    [id number]        Weight:       116.6 lb Date of Birth:  03-07-1966          BSA:          1.573 m Patient Age:    10 years          BP:           130/84 mmHg Patient Gender: M                 HR:           56 bpm. Exam Location:  Inpatient Procedure: 2D Echo, Cardiac Doppler and Color Doppler Indications:    I63.9 Stroke  History:        Patient has prior history of Echocardiogram examinations, most                 recent 12/17/2020. Stroke; Risk Factors:Sleep Apnea, Diabetes,                 Dyslipidemia and Former Smoker.  Sonographer:    Wilkie Aye RVT RCS Referring Phys: [id number] JENNIFER YATES  Sonographer Comments: Image acquisition challenging due to uncooperative patient and Patient unable to turn; images off axis; patient kept pushing  Techs hand away, exam ended. IMPRESSIONS  1. Left ventricular ejection fraction, by estimation, is 60 to  65%. The left ventricle has normal function. The left ventricle has no regional wall motion abnormalities. There is mild left ventricular hypertrophy. Left ventricular diastolic parameters are consistent with Grade I diastolic dysfunction (impaired relaxation).  2. Right ventricular systolic function is normal. The right ventricular size is normal.  3. The mitral valve is normal in structure. Mild mitral valve regurgitation. No evidence of mitral stenosis.  4. The aortic valve is tricuspid. Aortic valve regurgitation is not visualized. Aortic valve sclerosis is present, with no evidence of aortic valve stenosis.  5. The inferior vena cava is normal in size with greater than 50% respiratory variability, suggesting right atrial pressure of 3 mmHg. Comparison(s): EF 65-70%. FINDINGS  Left Ventricle: Left ventricular ejection fraction, by estimation, is 60 to 65%. The left ventricle has normal function. The left ventricle has no regional wall motion abnormalities. The left ventricular internal cavity size was normal in size. There is  mild left ventricular hypertrophy. Left ventricular diastolic parameters are consistent with Grade I diastolic dysfunction (impaired relaxation). Right Ventricle: The right ventricular size is normal. Right ventricular systolic function is normal. Left Atrium: Left atrial size was normal in size. Right Atrium: Right atrial size was normal in size. Pericardium: There is no evidence of pericardial effusion. Mitral Valve: The mitral valve is normal in structure. Mild mitral valve regurgitation. No evidence of mitral valve stenosis. Tricuspid Valve: The tricuspid valve is normal in structure. Tricuspid valve regurgitation is mild . No evidence of tricuspid stenosis. Aortic Valve: The aortic valve is tricuspid. Aortic valve regurgitation is not visualized. Aortic valve sclerosis is present,  with no evidence of aortic valve stenosis. Aortic valve mean gradient measures 1.0 mmHg. Aortic valve peak gradient measures 2.6  mmHg. Aortic valve area, by VTI measures 4.70 cm. Pulmonic Valve: The pulmonic valve was normal in structure. Pulmonic valve regurgitation is trivial. No evidence of pulmonic stenosis. Aorta: The aortic root is normal in size and structure. Venous: The inferior vena cava is normal in size with greater than 50% respiratory variability, suggesting right atrial pressure of 3 mmHg. IAS/Shunts: The interatrial septum is aneurysmal. No atrial level shunt detected by color flow Doppler.  LEFT VENTRICLE PLAX 2D LVIDd:         3.70 cm   Diastology LVIDs:         2.80 cm   LV e' medial:   6.76 cm/s LV PW:         1.35 cm   LV E/e' medial: 9.0 LV IVS:        1.15 cm LVOT diam:     2.30 cm LV SV:         64 LV SV Index:   41 LVOT Area:     4.15 cm  RIGHT VENTRICLE RV S prime:     9.41 cm/s LEFT ATRIUM         Index LA diam:    2.90 cm 1.84 cm/m  AORTIC VALVE                    PULMONIC VALVE AV Area (Vmax):    4.08 cm     PV Vmax:          0.75 m/s AV Area (Vmean):   3.41 cm     PV Peak grad:     2.2 mmHg AV Area (VTI):     4.70 cm     PR End Diast Vel: 4.33 msec AV Vmax:  80.50 cm/s AV Vmean:          55.100 cm/s AV VTI:            0.137 m AV Peak Grad:      2.6 mmHg AV Mean Grad:      1.0 mmHg LVOT Vmax:         79.00 cm/s LVOT Vmean:        45.200 cm/s LVOT VTI:          0.155 m LVOT/AV VTI ratio: 1.13  AORTA Ao Root diam: 3.70 cm MITRAL VALVE               TRICUSPID VALVE MV Area (PHT): 2.26 cm    TR Peak grad:   26.8 mmHg MV Decel Time: 336 msec    TR Vmax:        259.00 cm/s MV E velocity: 60.60 cm/s MV A velocity: 87.30 cm/s  SHUNTS MV E/A ratio:  0.69        Systemic VTI:  0.16 m                            Systemic Diam: 2.30 cm Kirk Ruths MD Electronically signed by Kirk Ruths MD Signature Date/Time: 08/28/2022/12:15:17 PM    Final    DG CHEST PORT 1 VIEW  Result  Date: 08/27/2022 CLINICAL DATA:  PICC line placement EXAM: PORTABLE CHEST 1 VIEW COMPARISON:  Chest x-ray August 20, 2022 FINDINGS: A new left PICC line is identified with the distal tip near the caval atrial junction. The tip may be just beyond the caval atrial junction the patient may benefit from pulling the PICC line back 2 cm. The heart, hila, and mediastinum are unremarkable. No pneumothorax. No nodules, masses, or focal infiltrates. IMPRESSION: The new left PICC line is in good position. The tip may be just beyond the caval atrial junction. The patient may benefit from pulling the PICC line back 2 cm. These results will be called to the ordering clinician or representative by the Radiologist Assistant, and communication documented in the PACS or [employer]. Electronically Signed   By: Dorise Bullion III M.D.   On: 08/27/2022 16:04   Home sleep test  Result Date: 08/10/2022 Star Age, MD     08/12/2022 12:27 PM  [facility] HOME SLEEP TEST (Watch PAT) REPORT STUDY DATE: 08/10/2022 DOB: Sep 30, 1965 MRN: [id number] ORDERING CLINICIAN: Star Age, MD, PhD  REFERRING CLINICIAN: [name], NP/Dr. Leta Baptist CLINICAL INFORMATION/HISTORY: 57 year old right-handed gentleman with underlying complex medical history of seizure disorder, mood disorder, stroke, hypertension, diabetes, coronary artery disease with history of MI, Hx of SUD (by chart review), chronic back pain, cervical radiculopathy, and hyperlipidemia, who was previously diagnosed with obstructive sleep apnea.  He does not currently use a CPAP machine. Epworth sleepiness score: 10/24. BMI: 19.4 kg/m FINDINGS: Sleep Summary: Total Recording Time (hours, min): 10 hours, 55 min Total Sleep Time (hours, min):  9 hours, 46 min Percent REM (%):    5% Respiratory Indices: Calculated pAHI (per hour):  19.1/hour       REM pAHI:    N/A     NREM pAHI: 19.1/hour Central pAHI: 3.1/hour Oxygen Saturation Statistics:  Oxygen Saturation (%)  Mean: 95% Minimum oxygen saturation (%):                 80% O2 Saturation Range (%): 80-99%  O2 Saturation (minutes) <=88%: 0.3 min Pulse Rate Statistics: Pulse Mean (  bpm):    65/min  Pulse Range (43- 112/min) IMPRESSION: OSA (obstructive sleep apnea) RECOMMENDATION: This home sleep test demonstrates moderate obstructive sleep apnea with a total AHI of 19.1/hour and O2 nadir of 80%.  Intermittent mild to moderate snoring was detected, rarely in the louder range. Treatment with a positive airway pressure (PAP) device is recommended. The patient will be advised to proceed with an autoPAP titration/trial at home for now. A full night titration study may be considered to optimize treatment settings, monitor proper oxygen saturations and aid with improvement of tolerance and adherence, if needed down the road. Alternative treatment options may include a dental device through dentistry or orthodontics in selected patients or Inspire (hypoglossal nerve stimulator) in carefully selected patients (meeting inclusion criteria).  Concomitant weight loss is recommended (where clinically appropriate). Please note that untreated obstructive sleep apnea may carry additional perioperative morbidity. Patients with significant obstructive sleep apnea should receive perioperative PAP therapy and the surgeons and particularly the anesthesiologist should be informed of the diagnosis and the severity of the sleep disordered breathing. The patient should be cautioned not to drive, work at heights, or operate dangerous or heavy equipment when tired or sleepy. Review and reiteration of good sleep hygiene measures should be pursued with any patient. Other causes of the patient's symptoms, including circadian rhythm disturbances, an underlying mood disorder, medication effect and/or an underlying medical problem cannot be ruled out based on this test. Clinical correlation is recommended. The patient and his referring provider will be notified  of the test results. The patient will be seen in follow up in sleep clinic at [facility]. I certify that I have reviewed the raw data recording prior to the issuance of this report in accordance with the standards of the American Academy of Sleep Medicine (AASM). INTERPRETING PHYSICIAN: Star Age, MD, PhD Medical Director, [facility]) [place], ABPN (Neurology and Sleep) [facility] [address], [place], [place] [address] [phone]     Discharge Exam: Vitals:   08/31/22 0328 08/31/22 0829  BP: 132/87 (!) 150/95  Pulse: 60 (!) 58  Resp: 13 15  Temp: 98.9 F (37.2 C) 98.3 F (36.8 C)  SpO2: 100% 100%   Vitals:   08/30/22 2030 08/31/22 0029 08/31/22 0328 08/31/22 0829  BP: 117/78 133/72 132/87 (!) 150/95  Pulse:  68 60 (!) 58  Resp: '17 19 13 15  '$ Temp: 99.4 F (37.4 C) 98.4 F (36.9 C) 98.9 F (37.2 C) 98.3 F (36.8 C)  TempSrc: Oral Oral Oral Oral  SpO2:  97% 100% 100%  Weight:      Height:        General: Pt is alert, awake, not in acute distress Cardiovascular: RRR, S1/S2 +, no rubs, no gallops Respiratory: CTA bilaterally, no wheezing, no rhonchi Abdominal: Soft, NT, ND, bowel sounds + Extremities: no edema, no cyanosis    The results of significant diagnostics from this hospitalization (including imaging, microbiology, ancillary and laboratory) are listed below for reference.     Microbiology: Recent Results (from the past 240 hour(s))  Resp panel by RT-PCR (RSV, Flu A&B, Covid) Anterior Nasal Swab     Status: Abnormal   Collection Time: 08/27/22  3:03 PM   Specimen: Anterior Nasal Swab  Result Value Ref Range Status   SARS Coronavirus 2 by RT PCR POSITIVE (A) NEGATIVE Final    Comment: (NOTE) SARS-CoV-2 target nucleic acids are DETECTED.  The SARS-CoV-2 RNA is generally detectable in upper  respiratory specimens during the acute phase of infection. Positive results are indicative of the presence  of the identified virus, but do not rule out bacterial infection or co-infection with other pathogens not detected by the test. Clinical correlation with patient history and other diagnostic information is necessary to determine patient infection status. The expected result is Negative.  Fact Sheet for Patients: EntrepreneurPulse.com.au  Fact Sheet for Healthcare Providers: IncredibleEmployment.be  This test is not yet approved or cleared by the [place] FDA and  has been authorized for detection and/or diagnosis of SARS-CoV-2 by FDA under an Emergency Use Authorization (EUA).  This EUA will remain in effect (meaning this test can be used) for the duration of  the COVID-19 declaration under Section 564(b)(1) of the A ct, 21 U.S.C. section 360bbb-3(b)(1), unless the authorization is terminated or revoked sooner.     Influenza A by PCR NEGATIVE NEGATIVE Final   Influenza B by PCR NEGATIVE NEGATIVE Final    Comment: (NOTE) The Xpert Xpress SARS-CoV-2/FLU/RSV plus assay is intended as an aid in the diagnosis of influenza from Nasopharyngeal swab specimens and should not be used as a sole basis for treatment. Nasal washings and aspirates are unacceptable for Xpert Xpress SARS-CoV-2/FLU/RSV testing.  Fact Sheet for Patients: EntrepreneurPulse.com.au  Fact Sheet for Healthcare Providers: IncredibleEmployment.be  This test is not yet approved or cleared by the [place] FDA and has been authorized for detection and/or diagnosis of SARS-CoV-2 by FDA under an Emergency Use Authorization (EUA). This EUA will remain in effect (meaning this test can be used) for the duration of the COVID-19 declaration under Section 564(b)(1) of the Act, 21 U.S.C. section 360bbb-3(b)(1), unless the authorization is terminated or revoked.     Resp Syncytial Virus by PCR NEGATIVE NEGATIVE Final    Comment: (NOTE) Fact Sheet  for Patients: EntrepreneurPulse.com.au  Fact Sheet for Healthcare Providers: IncredibleEmployment.be  This test is not yet approved or cleared by the [place] FDA and has been authorized for detection and/or diagnosis of SARS-CoV-2 by FDA under an Emergency Use Authorization (EUA). This EUA will remain in effect (meaning this test can be used) for the duration of the COVID-19 declaration under Section 564(b)(1) of the Act, 21 U.S.C. section 360bbb-3(b)(1), unless the authorization is terminated or revoked.  Performed at [facility], [place] [address] [place], [place] [address]      Labs: BNP (last 3 results) No results for input(s): "BNP" in the last 8760 hours. Basic Metabolic Panel: Recent Labs  Lab 08/29/22 0346  NA 144  K 3.3*  CL 110  CO2 25  GLUCOSE 86  BUN 18  CREATININE 0.98  CALCIUM 8.4*   Liver Function Tests: No results for input(s): "AST", "ALT", "ALKPHOS", "BILITOT", "PROT", "ALBUMIN" in the last 168 hours. No results for input(s): "LIPASE", "AMYLASE" in the last 168 hours. No results for input(s): "AMMONIA" in the last 168 hours. CBC: Recent Labs  Lab 08/29/22 0346  WBC 6.7  HGB 10.6*  HCT 30.2*  MCV 89.3  PLT 165   Cardiac Enzymes: No results for input(s): "CKTOTAL", "CKMB", "CKMBINDEX", "TROPONINI" in the last 168 hours. BNP: Invalid input(s): "POCBNP" CBG: Recent Labs  Lab 08/30/22 0607 08/30/22 1145 08/30/22 1741 08/30/22 2112 08/31/22 0701  GLUCAP 115* 179* 316* 104* 73   D-Dimer No results for input(s): "DDIMER" in the last 72 hours. Hgb A1c No results for input(s): "HGBA1C" in the last 72 hours. Lipid Profile No results for input(s): "CHOL", "HDL", "LDLCALC", "TRIG", "CHOLHDL", "LDLDIRECT"  in the last 72 hours. Thyroid function studies No results for input(s): "TSH", "T4TOTAL", "T3FREE", "THYROIDAB" in the last 72 hours.  Invalid input(s): "FREET3" Anemia work up No results for  input(s): "VITAMINB12", "FOLATE", "FERRITIN", "TIBC", "IRON", "RETICCTPCT" in the last 72 hours. Urinalysis    Component Value Date/Time   COLORURINE YELLOW 12/17/2020 0254   APPEARANCEUR CLEAR 12/17/2020 0254   LABSPEC 1.038 (H) 12/17/2020 0254   PHURINE 6.0 12/17/2020 0254   GLUCOSEU 50 (A) 12/17/2020 0254   HGBUR SMALL (A) 12/17/2020 0254   BILIRUBINUR NEGATIVE 12/17/2020 0254   KETONESUR 5 (A) 12/17/2020 0254   PROTEINUR 100 (A) 12/17/2020 0254   UROBILINOGEN 0.2 10/15/2014 1430   NITRITE NEGATIVE 12/17/2020 0254   LEUKOCYTESUR NEGATIVE 12/17/2020 0254   Sepsis Labs Recent Labs  Lab 08/29/22 0346  WBC 6.7   Microbiology Recent Results (from the past 240 hour(s))  Resp panel by RT-PCR (RSV, Flu A&B, Covid) Anterior Nasal Swab     Status: Abnormal   Collection Time: 08/27/22  3:03 PM   Specimen: Anterior Nasal Swab  Result Value Ref Range Status   SARS Coronavirus 2 by RT PCR POSITIVE (A) NEGATIVE Final    Comment: (NOTE) SARS-CoV-2 target nucleic acids are DETECTED.  The SARS-CoV-2 RNA is generally detectable in upper respiratory specimens during the acute phase of infection. Positive results are indicative of the presence of the identified virus, but do not rule out bacterial infection or co-infection with other pathogens not detected by the test. Clinical correlation with patient history and other diagnostic information is necessary to determine patient infection status. The expected result is Negative.  Fact Sheet for Patients: EntrepreneurPulse.com.au  Fact Sheet for Healthcare Providers: IncredibleEmployment.be  This test is not yet approved or cleared by the [place] FDA and  has been authorized for detection and/or diagnosis of SARS-CoV-2 by FDA under an Emergency Use Authorization (EUA).  This EUA will remain in effect (meaning this test can be used) for the duration of  the COVID-19 declaration under Section  564(b)(1) of the A ct, 21 U.S.C. section 360bbb-3(b)(1), unless the authorization is terminated or revoked sooner.     Influenza A by PCR NEGATIVE NEGATIVE Final   Influenza B by PCR NEGATIVE NEGATIVE Final    Comment: (NOTE) The Xpert Xpress SARS-CoV-2/FLU/RSV plus assay is intended as an aid in the diagnosis of influenza from Nasopharyngeal swab specimens and should not be used as a sole basis for treatment. Nasal washings and aspirates are unacceptable for Xpert Xpress SARS-CoV-2/FLU/RSV testing.  Fact Sheet for Patients: EntrepreneurPulse.com.au  Fact Sheet for Healthcare Providers: IncredibleEmployment.be  This test is not yet approved or cleared by the [place] FDA and has been authorized for detection and/or diagnosis of SARS-CoV-2 by FDA under an Emergency Use Authorization (EUA). This EUA will remain in effect (meaning this test can be used) for the duration of the COVID-19 declaration under Section 564(b)(1) of the Act, 21 U.S.C. section 360bbb-3(b)(1), unless the authorization is terminated or revoked.     Resp Syncytial Virus by PCR NEGATIVE NEGATIVE Final    Comment: (NOTE) Fact Sheet for Patients: EntrepreneurPulse.com.au  Fact Sheet for Healthcare Providers: IncredibleEmployment.be  This test is not yet approved or cleared by the [place] FDA and has been authorized for detection and/or diagnosis of SARS-CoV-2 by FDA under an Emergency Use Authorization (EUA). This EUA will remain in effect (meaning this test can be used) for the duration of the COVID-19 declaration under Section 564(b)(1) of the Act, 21  U.S.C. section 360bbb-3(b)(1), unless the authorization is terminated or revoked.  Performed at [facility], [place] [address] [place], [place] [address]      Time coordinating discharge: Over 30 minutes  SIGNED:   Darliss Cheney, MD  [facility] 08/31/2022, 10:05 AM *Please note that this is a verbal dictation therefore any spelling or grammatical errors are due to the "[place]" system interpretation. If 7PM-7AM, please contact night-coverage [email or web address]

## 2022-08-31 NOTE — Progress Notes (Signed)
Patient refused all PO medications, spat them out and stated "I don't want those"  patient educated, MD notified

## 2022-08-31 NOTE — Progress Notes (Signed)
Speech Language Pathology Treatment: Dysphagia  Patient Details Name: Melvin Taylor MRN: [id number] DOB: March 30, 1966 Today's Date: 08/31/2022 Time: 7026-3785 SLP Time Calculation (min) (ACUTE ONLY): 15 min  Assessment / Plan / Recommendation Clinical Impression  Patient seen by SLP for skilled treatment focused on dysphagia goals. Patient was awake and alert, holding unopened cup of pre-thickened juice and seemed to be trying to open it. SLP provided assist with setup of meal tray. Patient was able to manage liquid PO's via cup sips without assistance but continues to be impulsive, taking very large sips and not responding to cues for smaller sips. Frequency of delayed cough after liquid PO intake was slightly less than previous date. Patient declined to have any of the dysphagia 2 solids but did eat the puree solids (applesauce). SLP assisted with holding cup and cues for him to use left hand to feed self. Patient will require further trials of various PO textures (purees and dys 3 solids) to determine if any impact on his PO intake.    HPI HPI: 57 y.o. male with medical history significant of bipolar d/o vs/ schizophrenia, chronic chest wall/neck/back pain, CAD s/p CABG, DM, HTN, polysubstance abuse, OSA, and seizure d/o presenting with AMS.  He was admitted to [facility] from 1/20-27 with AMS, minimally improving during hospitalization.  He was noted to have a persistent punctate focus of restricted diffusion in the L medial temporal lobe, concerning for acute/subacute CVA.  MRI/CT with SDH that developed during the hospitalization, 4 mm in L occipital region.  He has been on IV Keppra throughout the hospitalization without apparent seizure activity.  He is able to answer very limited questions, none about orientation.  He does follow some commands      SLP Plan  Continue with current plan of care      Recommendations for follow up therapy are one component of a multi-disciplinary discharge planning  process, led by the attending physician.  Recommendations may be updated based on patient status, additional functional criteria and insurance authorization.    Recommendations  Diet recommendations: Dysphagia 2 (fine chop);Nectar-thick liquid Liquids provided via: Cup;Straw Medication Administration: Whole meds with puree Supervision: Staff to assist with self feeding;Full supervision/cueing for compensatory strategies Compensations: Slow rate;Small sips/bites;Minimize environmental distractions;Lingual sweep for clearance of pocketing Postural Changes and/or Swallow Maneuvers: Seated upright 90 degrees                Oral Care Recommendations: Oral care BID Follow Up Recommendations: Acute inpatient rehab (3hours/day) Assistance recommended at discharge: Frequent or constant Supervision/Assistance SLP Visit Diagnosis: Dysphagia, oropharyngeal phase (R13.12) Plan: Continue with current plan of care          Sonia Baller, MA, CCC-SLP Speech Therapy

## 2022-08-31 NOTE — Progress Notes (Signed)
Occupational Therapy Treatment Patient Details Name: MOSI HANNOLD MRN: [id number] DOB: [birth year] Today's Date: 08/31/2022   History of present illness Melvin Taylor is a 57 y.o. male who was admitted to [facility] from 1/20-27 with AMS, minimally improving during hospitalization.  He was noted to have a persistent punctate focus of restricted diffusion in the L medial temporal lobe, concerning for acute/subacute CVA.  MRI/CT with SDH that developed during the hospitalization, 4 mm in L occipital region. PMH: bipolar d/o vs/ schizophrenia, chronic chest wall/neck/back pain, CAD s/p CABG, DM, HTN, polysubstance abuse, OSA, and seizure d/o   OT comments  Pt progressing towards established OT goals. Pt continues to be limited by communication, cognition, and R hemiplegia. Pt with RUE in 100 degrees elbow flexion and shoulder adduction at rest. Focus session on functional mobility, RUE ROM, and repositioning. AAROM of digits with max multimodal cues. PROM of wrist, elbow, forearm and shoulder. Manual therapy to shoulder and scapula and pt with decreased tightness to tolerate greater shoulder and scapular ROM, but still limited significantly. Repositioned air splint at elbow and repositioned shoulder. Max multimodal cues throughout to follow commands and participate in functional mobility/ ADL. Highly recommend multidisciplinary approach at AIR to optimize independence.    Recommendations for follow up therapy are one component of a multi-disciplinary discharge planning process, led by the attending physician.  Recommendations may be updated based on patient status, additional functional criteria and insurance authorization.    Follow Up Recommendations  Acute inpatient rehab (3hours/day)     Assistance Recommended at Discharge Frequent or constant Supervision/Assistance  Patient can return home with the following  A lot of help with walking and/or transfers;Two people to help with walking and/or  transfers;A lot of help with bathing/dressing/bathroom;Two people to help with bathing/dressing/bathroom;Assistance with cooking/housework;Assistance with feeding;Direct supervision/assist for medications management;Direct supervision/assist for financial management;Assist for transportation;Help with stairs or ramp for entrance   Equipment Recommendations  Other (comment) (defer)    Recommendations for Other Services Rehab consult    Precautions / Restrictions Precautions Precautions: Fall Precaution Comments: R hemiplegia Restrictions Weight Bearing Restrictions: No       Mobility Bed Mobility Overal bed mobility: Needs Assistance Bed Mobility: Supine to Sit     Supine to sit: Mod assist     General bed mobility comments: max multimodal cues for sequencing    Transfers Overall transfer level: Needs assistance Equipment used: 2 person hand held assist Transfers: Sit to/from Stand, Bed to chair/wheelchair/BSC Sit to Stand: Mod assist, +2 physical assistance, Min assist (mod on first attempts, min for additional attempts with greater cueing)     Step pivot transfers: Min assist, +2 physical assistance, +2 safety/equipment     General transfer comment: multimodal cues for hand placement, safety, and sequencing     Balance Overall balance assessment: Needs assistance Sitting-balance support: Feet supported, Single extremity supported Sitting balance-Leahy Scale: Fair Sitting balance - Comments: pt with posterior R lateral lean, but able to static sit with min guard A   Standing balance support: Single extremity supported, During functional activity Standing balance-Leahy Scale: Poor Standing balance comment: reliant on external support                           ADL either performed or assessed with clinical judgement   ADL Overall ADL's : Needs assistance/impaired     Grooming: Minimal assistance;Sitting;Wash/dry face Grooming Details (indicate cue  type and reason): MIn A to initiate and  intermittently for sitting balance.                 Toilet Transfer: Moderate assistance;+2 for physical assistance;+2 for safety/equipment;Minimal assistance;Stand-pivot Toilet Transfer Details (indicate cue type and reason): Mod A up from EOB, with max cues and incresed time able to stand from recliner with min A +2. Pivoting to recliner with min-mod +2 and max multimodal cues for qweight shift/sequenceing of LE movement Toileting- Clothing Manipulation and Hygiene: Maximal assistance;+2 for physical assistance;+2 for safety/equipment;Sit to/from stand Toileting - Clothing Manipulation Details (indicate cue type and reason): for posterior pericare as pt lightly soiled on arrival     Functional mobility during ADLs: +2 for physical assistance;+2 for safety/equipment;Moderate assistance;Minimal assistance General ADL Comments: limited by L hemibody impairments, cog, communication    Extremity/Trunk Assessment Upper Extremity Assessment Upper Extremity Assessment: RUE deficits/detail RUE Deficits / Details: contracted, minimal PORM of elbow and shoulder. ~100 degrees elbow flexion at rest; with PROM following manual therapy, able to achieve elbow extension, but continues to lack ~10 degrees (not able to extend to 0 degrees with PROM. Pt tolerating ~20 degrees shoulder flexion and abduction following manual therapy to musculature surrounding scapula and scapular mobility. Tightness with all scapular movement and very limited in scaular elevation. wrist and hand with full PROM. minimal active movement. grimiacing to touch RUE Sensation: decreased light touch;decreased proprioception RUE Coordination: decreased fine motor;decreased gross motor   Lower Extremity Assessment Lower Extremity Assessment: Defer to PT evaluation        Vision   Additional Comments: not formally assessed. Maintains eyes closed for majorty of session and requires cues to  make eye contact. Able to reach out with LUE for wash cloth and to touch therpist's hand with significantly incresaed time.   Perception     Praxis      Cognition Arousal/Alertness: Awake/alert Behavior During Therapy: Flat affect Overall Cognitive Status: No family/caregiver present to determine baseline cognitive functioning Area of Impairment: Attention, Memory, Following commands, Safety/judgement, Awareness, Problem solving                   Current Attention Level: Sustained Memory: Decreased short-term memory Following Commands: Follows one step commands with increased time Safety/Judgement: Decreased awareness of safety, Decreased awareness of deficits Awareness: Intellectual Problem Solving: Slow processing, Difficulty sequencing, Requires verbal cues, Requires tactile cues General Comments: Pt continues with delayed verbal responses and one-two word answers. Pt follows commands during functional mobility with significantly increased time for processing. Pt with decresaed reasoning/problem solving abilities and with limited command following during UE exercises/stretching to facilitate functional use of RUE. Max multimodal cue and max increased time for pt to initiate.        Exercises Exercises: Other exercises Other Exercises Other Exercises: standing with + A for safety; OT manually facilitating elbow extension and pt able to extend AAROM and lacking 10 degrees. Other Exercises: seated in recliner: attempting shoulder flexion, but pt with max tightness and pain. Manual therapy to lats, pecs, rhomboids, traps to decrease tightness and pain. Facilitating scapular mobility elevation/depression/protractio/retraction. Pt limited in scapular elevation affecting shoulder mobility. Tolerating ~20 degrees shoulder flexion after scapular mobility. Repositioned Posey air splint to elbow in elbow extension. hand in lap in semi-supinated position (thumb up) and slought shoulder  flexion to optimize functional use.    Shoulder Instructions       General Comments VSS.    Pertinent Vitals/ Pain       Pain Assessment Pain Assessment: Faces Faces Pain  Scale: Hurts even more Pain Location: RUE with PROM elbow extension, forearm supination, and shoulder ROM Pain Descriptors / Indicators: Discomfort, Grimacing Pain Intervention(s): Limited activity within patient's tolerance, Monitored during session, Repositioned  Home Living     Available Help at Discharge: Family;Available 24 hours/day Type of Home: Apartment                              Lives With: Family    Prior Functioning/Environment              Frequency  Min 2X/week        Progress Toward Goals  OT Goals(current goals can now be found in the care plan section)  Progress towards OT goals: Progressing toward goals  Acute Rehab OT Goals Patient Stated Goal: rest OT Goal Formulation: With patient Time For Goal Achievement: 09/11/22 Potential to Achieve Goals: Good ADL Goals Pt Will Perform Grooming: with min assist;sitting Pt Will Perform Upper Body Dressing: with min assist;sitting Pt Will Perform Lower Body Dressing: with mod assist;sit to/from stand Pt Will Transfer to Toilet: with mod assist;stand pivot transfer;bedside commode  Plan Discharge plan remains appropriate;Frequency remains appropriate    Co-evaluation                 AM-PAC OT "6 Clicks" Daily Activity     Outcome Measure   Help from another person eating meals?: A Little Help from another person taking care of personal grooming?: A Lot Help from another person toileting, which includes using toliet, bedpan, or urinal?: A Lot Help from another person bathing (including washing, rinsing, drying)?: A Lot Help from another person to put on and taking off regular upper body clothing?: A Lot Help from another person to put on and taking off regular lower body clothing?: A Lot 6 Click Score:  13    End of Session Equipment Utilized During Treatment: Gait belt  OT Visit Diagnosis: Unsteadiness on feet (R26.81);Other abnormalities of gait and mobility (R26.89);Muscle weakness (generalized) (M62.81);Hemiplegia and hemiparesis Hemiplegia - Right/Left: Right   Activity Tolerance Patient tolerated treatment well   Patient Left in chair;with call bell/phone within reach;with chair alarm set;with nursing/sitter in room   Nurse Communication Mobility status (RN present at end of session)        Time: 1025-1105 OT Time Calculation (min): 40 min  Charges: OT General Charges $OT Visit: 1 Visit OT Treatments $Self Care/Home Management : 8-22 mins $Therapeutic Exercise: 23-37 mins  Elder Cyphers, OTR/L [facility] Acute Rehabilitation Office: [phone]   Magnus Ivan 08/31/2022, 11:41 AM

## 2022-08-31 NOTE — Progress Notes (Signed)
Inpatient Rehab Admissions Coordinator:   I have a CIR bed for this PT. And can admit today. RN may call report to [place], [place], [place] Admissions Coordinator  [phone] ([place]) [phone] (office)

## 2022-08-31 NOTE — Progress Notes (Signed)
Sister [name] called, updated her on patient transferring to CIR and patient CBG

## 2022-08-31 NOTE — Progress Notes (Signed)
PMR Admission Coordinator Pre-Admission Assessment   Patient: EDD REPPERT is an 57 y.o., male MRN: [id number] DOB: [birth year] Height: '5\' 5"'$  (165.1 cm) Weight: 52.9 kg   Insurance Information HMO:     PPO:      PCP:      IPA:      80/20:      OTHER:  PRIMARY: Medicaid [place] Access      Policy#: [id number]       Subscriber: Pt.  CM Name:       Phone#:      Fax#:  Pre-Cert#:       Employer:  Benefits:  Phone #:      Name:  Eff. Date: effective 08/30/22 per passport onesource     Deduct:       Out of Pocket Max:       Life Max:  CIR:       SNF:  Outpatient:      Co-Pay:  Home Health:       Co-Pay:  DME:      Co-Pay:  Providers: in network  SECONDARY:       Policy#:      Phone#:    Development worker, community:       Phone#:    The Engineer, petroleum" for patients in Inpatient Rehabilitation Facilities with attached "Privacy Act [place] Records" was provided and verbally reviewed with: Patient   Emergency Contact Information Contact Information       Name Relation Home Work Mobile    [place] Sister     [phone]    gant,betty Elenor Legato     [phone]           Current Medical History  Patient Admitting Diagnosis: CVA History of Present Illness:  HPI: DEANGLO HISSONG is a 57 year old right-handed male with history of bipolar disorder/schizophrenia, CAD/CABG, diabetes mellitus, hypertension, polysubstance abuse, OSA and will not use CPAP and seizure disorder//epilepsy maintained on Depakote as well as Lamictal as well as left MCA infarction with stenting maintained on Brilinta as well as Plavix receiving inpatient rehab services 12/21/2020 - 12/30/2020.  He was discharged to home ambulating 150 feet without assistive device contact-guard.  Per chart review patient lives alone.  1 level apartment with elevator.  Has a home health aide 53 hours a week to assist with making meals dressing bathing managing medications.  Sister does finances and his  POA.  Presented 08/27/2022 from outside hospital after admission 1/20-27 with altered mental status.  He was noted to have persistent punctate focus of restricted diffusion in the left temporal medial lobe concerning for acute/subacute CVA on imaging at outside hospital.  MRI showed trace SDH that developed during the hospitalization 4 mm in the left occipital region.  He was [facility].  Cranial CT scan showed stable trace subdural hematoma along the posterior falx and posteriorly occipital lobe.  Stable remote infarcts of the left basal ganglia, anterior left frontal lobe and corona radiata but no acute CVA noted.  Stable left MCA stent.  Admission chemistries unremarkable except potassium 3.2, BUN 39, hemoglobin 9.2, valproic acid level 66.  Neurology follow-up patient currently remains on Brilinta as prior to admission but Plavix on hold.  EEG negative for seizure currently remains on Keppra which had been started at outside hospital.  His Depakote and Lamictal prior to admission are currently on hold.  Echocardiogram ejection fraction of 60 to 65% no wall motion abnormalities grade 1 diastolic dysfunction.  Hospital course COVID questionably incidental finding patient asymptomatic and completed antiviral/steroid course and maintained on airborne contact precautions.  Currently on dysphagia #2 nectar thick liquid diet.  Therapy evaluations completed due to patient decreased functional mobility altered mental status/dysphagia was admitted for a comprehensive rehab program.    Complete NIHSS TOTAL: 13   Patient's medical record from [facility].  has been reviewed by the rehabilitation admission coordinator and physician.   Past Medical History      Past Medical History:  Diagnosis Date   Bipolar 1 disorder ([place])      depression/anxiety   Bone spur      left heel   Cervical radiculopathy     Chronic back pain     Chronic chest wall pain     Chronic neck pain      Coronary artery disease      a. s/p CABG in 03/2016 with LIMA-LAD, SVG-D1, SVG-RCA, and Seq SVG-mid and distal OM   Diabetes mellitus      Type II   Hallucinations      "long history of them"   Headache(784.0)     History of gout     HTN (hypertension)     Insomnia     Neuropathy     Pain management     Polysubstance abuse ([place])     Right leg pain      chronic   Schizophrenia ([place])     Seizures ([place])      last sz between July 5-9th, 2016; epilepsy   Sleep apnea     Stroke [facility])     Transfusion of blood product refused for religious reason        Has the patient had major surgery during 100 days prior to admission? No   Family History   family history includes Arthritis in an other family member; Asthma in an other family member; Diabetes in his father and another family member; Hypertension in his father; Sleep apnea in his father; Stroke in his sister.   Current Medications   Current Facility-Administered Medications:    0.9 %  sodium chloride infusion, , Intravenous, Continuous, Karmen Bongo, MD, Last Rate: 50 mL/hr at 08/30/22 0938, Started During Downtime at 08/30/22 1937   [DISCONTINUED] acetaminophen (TYLENOL) tablet 650 mg, 650 mg, Oral, Q4H PRN **OR** acetaminophen (TYLENOL) 160 MG/5ML solution 650 mg, 650 mg, Per Tube, Q4H PRN **OR** acetaminophen (TYLENOL) suppository 650 mg, 650 mg, Rectal, Q4H PRN, Karmen Bongo, MD   albuterol (PROVENTIL) (2.5 MG/3ML) 0.083% nebulizer solution 3 mL, 3 mL, Inhalation, Q4H PRN, Karmen Bongo, MD   atorvastatin (LIPITOR) tablet 80 mg, 80 mg, Oral, Ivery Quale, MD, 80 mg at 08/29/22 2100   Chlorhexidine Gluconate Cloth 2 % PADS 6 each, 6 each, Topical, Daily, Karmen Bongo, MD, 6 each at 08/30/22 0919   cycloSPORINE (RESTASIS) 0.05 % ophthalmic emulsion 1 drop, 1 drop, Both Eyes, BID, Karmen Bongo, MD, 1 drop at 08/30/22 0920   escitalopram (LEXAPRO) tablet 20 mg, 20 mg, Oral, Daily, Karmen Bongo, MD, 20 mg  at 08/30/22 0918   feeding supplement (NEPRO CARB STEADY) liquid 237 mL, 237 mL, Oral, TID WC, Little Ishikawa, MD, 237 mL at 08/30/22 1206   insulin aspart (novoLOG) injection 0-15 Units, 0-15 Units, Subcutaneous, TID WC, Karmen Bongo, MD, 3 Units at 08/30/22 1207   insulin aspart (novoLOG) injection 0-5 Units, 0-5 Units, Subcutaneous, Ivery Quale, MD, 3 Units at 08/29/22 2209   isosorbide mononitrate (IMDUR)  24 hr tablet 30 mg, 30 mg, Oral, Daily, Karmen Bongo, MD, 30 mg at 08/30/22 0917   levETIRAcetam (KEPPRA) IVPB 1000 mg/100 mL premix, 1,000 mg, Intravenous, Q12H, Karmen Bongo, MD, Last Rate: 400 mL/hr at 08/30/22 0316, 1,000 mg at 08/30/22 0316   multivitamin with minerals tablet 1 tablet, 1 tablet, Oral, Daily, Little Ishikawa, MD, 1 tablet at 08/30/22 1740   sodium chloride flush (NS) 0.9 % injection 10-40 mL, 10-40 mL, Intracatheter, Q12H, Karmen Bongo, MD, 10 mL at 08/30/22 0920   sodium chloride flush (NS) 0.9 % injection 10-40 mL, 10-40 mL, Intracatheter, PRN, Karmen Bongo, MD   ticagrelor Kary Kos) tablet 90 mg, 90 mg, Oral, BID, Kerney Elbe, MD, 90 mg at 08/30/22 8144   Patients Current Diet:  Diet Order                  DIET DYS 2 Room service appropriate? No; Fluid consistency: Nectar Thick  Diet effective now                         Precautions / Restrictions Precautions Precautions: Fall Precaution Comments: R hemiplegia Restrictions Weight Bearing Restrictions: No    Has the patient had 2 or more falls or a fall with injury in the past year? Yes   Prior Activity Level Limited Community (1-2x/wk): pt went out for appts   Prior Functional Level Self Care: Did the patient need help bathing, dressing, using the toilet or eating? Needed some help   Indoor Mobility: Did the patient need assistance with walking from room to room (with or without device)? Needed some help   Stairs: Did the patient need assistance with internal  or external stairs (with or without device)? Needed some help   Functional Cognition: Did the patient need help planning regular tasks such as shopping or remembering to take medications? Dependent   Patient Information Are you of Hispanic, Latino/a,or Spanish origin?: A. No, not of Hispanic, Latino/a, or Spanish origin What is your race?: B. Black or African American Do you need or want an interpreter to communicate with a doctor or health care staff?: 0. No   Patient's Response To:  Health Literacy and Transportation Is the patient able to respond to health literacy and transportation needs?: Yes Health Literacy - How often do you need to have someone help you when you read instructions, pamphlets, or other written material from your doctor or pharmacy?: Often In the past 12 months, has lack of transportation kept you from medical appointments or from getting medications?: No In the past 12 months, has lack of transportation kept you from meetings, work, or from getting things needed for daily living?: No   Home Assistive Devices / [place] Devices/Equipment: Bedside commode/3-in-1, Environmental consultant (specify type), Shower chair with back, Cane (specify quad or straight), CBG Meter, Blood pressure cuff Home Equipment: Conservation officer, nature (2 wheels), Cane - single point, Shower seat, Hand held shower head   Prior Device Use: Indicate devices/aids used by the patient prior to current illness, exacerbation or injury? Walker   Current Functional Level Cognition   Overall Cognitive Status: Impaired/Different from baseline Current Attention Level: Sustained Orientation Level: Oriented to person, Disoriented to place, Disoriented to time, Disoriented to situation Following Commands: Follows one step commands with increased time Safety/Judgement: Decreased awareness of safety, Decreased awareness of deficits General Comments: per sister, [name], pt normally able to hold a conversation, pt now  with delayed response time and one  word answers    Extremity Assessment (includes Sensation/Coordination)   Upper Extremity Assessment: RUE deficits/detail RUE Deficits / Details: contracted, minimal PORM of elbow and shoulder. wrist and hand with full PROM. minimal activity movement. grimiacing to touch RUE Sensation: decreased light touch, decreased proprioception RUE Coordination: decreased fine motor, decreased gross motor  Lower Extremity Assessment: Defer to PT evaluation RLE Deficits / Details: increased tone, grossly 2+/5 RLE Coordination: decreased gross motor     ADLs   Overall ADL's : Needs assistance/impaired Eating/Feeding: Moderate assistance, Sitting Grooming: Moderate assistance, Sitting Upper Body Bathing: Maximal assistance, Sitting Lower Body Bathing: Maximal assistance, +2 for safety/equipment, +2 for physical assistance, Sit to/from stand Upper Body Dressing : Maximal assistance, Sitting Lower Body Dressing: Maximal assistance, +2 for safety/equipment, +2 for physical assistance, Sit to/from stand Toilet Transfer: +2 for physical assistance, +2 for safety/equipment, Maximal assistance Toileting- Clothing Manipulation and Hygiene: Maximal assistance, +2 for physical assistance, +2 for safety/equipment, Sit to/from stand Functional mobility during ADLs: +2 for physical assistance, +2 for safety/equipment, Maximal assistance General ADL Comments: limited by L hemibody impairments, cog, communication     Mobility   Overal bed mobility: Needs Assistance Bed Mobility: Supine to Sit Supine to sit: Mod assist, +2 for safety/equipment General bed mobility comments: Pt initiating well, exiting towards left side of bed, assist at trunk to bring upright     Transfers   Overall transfer level: Needs assistance Equipment used: 2 person hand held assist (2 person face to face transfer with gait belt) Transfers: Sit to/from Stand, Bed to chair/wheelchair/BSC Sit to Stand: Mod  assist, +2 safety/equipment Bed to/from chair/wheelchair/BSC transfer type:: Stand pivot Stand pivot transfers: Mod assist, +2 safety/equipment Step pivot transfers: Max assist, +2 physical assistance General transfer comment: Cues for pushing off with left hand, modA to power up and pivot towards left to chair.     Ambulation / Gait / Stairs / Wheelchair Mobility   Ambulation/Gait Ambulation/Gait assistance: Mod assist, +2 safety/equipment, +2 physical assistance Gait Distance (Feet): 3 Feet Assistive device: 2 person hand held assist Gait Pattern/deviations: Step-to pattern, Decreased stride length, Shuffle, Decreased weight shift to right General Gait Details: Increased R foot external rotation, decreased bilateral foot clearance, modA for standing balance and close chair follow Gait velocity: decreased Gait velocity interpretation: <1.31 ft/sec, indicative of household ambulator     Posture / Balance Dynamic Sitting Balance Sitting balance - Comments: pt with posterior R lateral lean Balance Overall balance assessment: Needs assistance Sitting-balance support: Feet supported, Single extremity supported Sitting balance-Leahy Scale: Fair Sitting balance - Comments: pt with posterior R lateral lean Standing balance support: Single extremity supported, During functional activity Standing balance-Leahy Scale: Poor Standing balance comment: reliant on external support     Special needs/care consideration Special service needs none     Previous Home Environment (from acute therapy documentation) Living Arrangements: Other (Comment)  Lives With: Family Available Help at Discharge: Family, Available 24 hours/day Type of Home: Apartment Home Layout: Other (Comment) (2nd floor with elevator) Home Access: Elevator Bathroom Shower/Tub: Chiropodist: Standard Bathroom Accessibility: Yes How Accessible: Accessible via walker, Accessible via wheelchair Home Care  Services: Yes Type of Home Care Services: Housekeeping, [place] (if known): [place] ([phone]) Additional Comments: info provided by sister   Discharge Living Setting Plans for Discharge Living Setting: Patient's home Type of Home at Discharge: Apartment Discharge Home Layout: Two level, Other (Comment) Alternate Level Stairs-Rails: None Discharge Home Access: Elevator Discharge Bathroom Shower/Tub:  Tub/shower unit Discharge Bathroom Toilet: Standard Discharge Bathroom Accessibility: Yes How Accessible: Accessible via walker, Accessible via wheelchair Does the patient have any problems obtaining your medications?: No   Social/Family/Support Systems Patient Roles: Other (Comment) Contact Information: Uncle Debroah Loop states he will spend nights with pt. and can do min-mod A. Sister [name] states that Pt. will have medicaid provided caregiver for 50 hrs a week (days) Anticipated Caregiver: [phone] Ability/Limitations of Caregiver: Can do Min A Caregiver Availability: 24/7 Discharge Plan Discussed with Primary Caregiver: Yes Is Caregiver In Agreement with Plan?: Yes Does Caregiver/Family have Issues with Lodging/Transportation while Pt is in Rehab?: No   Goals Patient/Family Goal for Rehab: PT/OT/SLP Supervision to Min A Expected length of stay: 16-18 days Pt/Family Agrees to Admission and willing to participate: Yes Program Orientation Provided & Reviewed with Pt/Caregiver Including Roles  & Responsibilities: Yes   Decrease burden of Care through IP rehab admission: not anticipated    Possible need for SNF placement upon discharge: none    Patient Condition: I have reviewed medical records from [facility] , spoken with CM, and patient and family member. I met with patient at the bedside for inpatient rehabilitation assessment.  Patient will benefit from ongoing PT, OT, and SLP, can actively participate in 3 hours of therapy a day 5  days of the week, and can make measurable gains during the admission.  Patient will also benefit from the coordinated team approach during an Inpatient Acute Rehabilitation admission.  The patient will receive intensive therapy as well as Rehabilitation physician, nursing, social worker, and care management interventions.  Due to safety, skin/wound care, disease management, medication administration, pain management, and patient education the patient requires 24 hour a day rehabilitation nursing.  The patient is currently mod A  with mobility and basic ADLs.  Discharge setting and therapy post discharge at home with home health is anticipated.  Patient has agreed to participate in the Acute Inpatient Rehabilitation Program and will admit today.   Preadmission Screen Completed By:  Genella Mech, 08/30/2022 2:51 PM ______________________________________________________________________   Discussed status with Dr. Curlene Dolphin  on 08/31/22 at 24 and received approval for admission today.   Admission Coordinator:  Genella Mech, CCC-SLP, time 1010/Date 08/31/22    Assessment/Plan: Diagnosis: acute metabolic encephalopathy  Does the need for close, 24 hr/day Medical supervision in concert with the patient's rehab needs make it unreasonable for this patient to be served in a less intensive setting? Yes Co-Morbidities requiring supervision/potential complications: DM, HLD, CAD, COVID-19, HTN, seizure disorder Due to bladder management, bowel management, safety, skin/wound care, disease management, medication administration, pain management, and patient education, does the patient require 24 hr/day rehab nursing? Yes Does the patient require coordinated care of a physician, rehab nurse, PT, OT, and SLP to address physical and functional deficits in the context of the above medical diagnosis(es)? Yes Addressing deficits in the following areas: balance, endurance, locomotion, strength, transferring, bowel/bladder  control, bathing, dressing, feeding, grooming, toileting, cognition, speech, language, swallowing, and psychosocial support Can the patient actively participate in an intensive therapy program of at least 3 hrs of therapy 5 days a week? Yes The potential for patient to make measurable gains while on inpatient rehab is good Anticipated functional outcomes upon discharge from inpatient rehab: supervision and min assist PT, supervision and min assist OT, supervision and min assist SLP Estimated rehab length of stay to reach the above functional goals is: 16-18 Anticipated discharge destination: Home 10. Overall Rehab/Functional Prognosis:  good     MD Signature: Jennye Boroughs

## 2022-08-31 NOTE — TOC Transition Note (Signed)
Transition of Care [facility]) - CM/SW Discharge Note   Patient Details  Name: Melvin Taylor MRN: [id number] Date of Birth: February 09, 1966  [facility] CM/SW Contact:  Pollie Friar, RN Phone Number: 08/31/2022, 1:06 PM   Clinical Narrative:    Pt is discharging to CIR today. CM signing off.    Final next level of care: IP Rehab Facility Barriers to Discharge: No Barriers Identified   Patient Goals and CMS Choice CMS Medicare.gov Compare Post Acute Care list provided to:: Patient    Discharge Placement                         Discharge Plan and Services Additional resources added to the After Visit Summary for                                       Social Determinants of Health (SDOH) Interventions SDOH Screenings   Food Insecurity: No Food Insecurity (08/27/2022)  Housing: Low Risk  (08/27/2022)  Transportation Needs: No Transportation Needs (08/27/2022)  Utilities: Not At Risk (08/27/2022)  Depression (PHQ2-9): Low Risk  (03/10/2021)  Tobacco Use: Medium Risk (08/27/2022)     Readmission Risk Interventions     No data to display

## 2022-08-31 NOTE — Evaluation (Signed)
Speech Language Pathology Evaluation Patient Details Name: TRIGGER FRASIER MRN: [id number] DOB: 1966-04-19 Today's Date: 08/31/2022 Time: 7782-4235 SLP Time Calculation (min) (ACUTE ONLY): 15 min  Problem List:  Patient Active Problem List   Diagnosis Date Noted   Protein-calorie malnutrition, severe 08/30/2022   Acute CVA (cerebrovascular accident) ([place]) 08/27/2022   Subdural hematoma ([place]) 08/27/2022   Seizure disorder ([place]) 08/27/2022   OSA (obstructive sleep apnea) 59/14/4315   Acute metabolic encephalopathy 59/03/6760   Anemia due to stage 3b chronic kidney disease ([place]) 06/06/2022   Trigger finger, left middle finger    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   Malnutrition of moderate degree 12/18/2020   Middle cerebral artery embolism, 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   Encounter for intubation    Renal insufficiency    Malignant hypertension    Acute ischemic left MCA stroke ([place]) 12/16/2020   Current smoker 06/21/2019   Seizure-like activity ([place]) 05/06/2019   Diabetes mellitus ([place]) 04/11/2019   Anemia, chronic disease 07/04/2018   Other specified diseases of the digestive system 10/30/2017   CAD (coronary artery disease) 03/28/2016   Chest pain 03/21/2016   Mixed hyperlipidemia 03/11/2016   Ischemic chest pain (HCC)    NSTEMI (non-ST elevated myocardial infarction) ([place])    Coronary atherosclerosis of native coronary artery 03/03/2016   Cervical spinal stenosis 12/14/2015   Loss of weight 08/25/2015   Essential hypertension, benign 07/23/2015   Personal history of noncompliance with medical treatment, presenting hazards to health 07/23/2015   Abdominal pain 06/08/2015   Constipation 06/08/2015   History of colonic polyps    Diverticulosis of colon without hemorrhage    Mucosal abnormality of stomach    Encounter for screening colonoscopy 04/16/2015   Dysphagia 04/16/2015   Partial symptomatic epilepsy with complex partial seizures, intractable, without status  epilepticus ([place]) 11/10/2014   Bipolar disorder, unspecified ([place]) 10/15/2014   Schizophrenia ([place]) 10/15/2014   DM type 2 causing vascular disease ([place]) 06/29/2014   Musculoskeletal pain 06/29/2014   Hyperglycemia without ketosis 09/07/2013   Degeneration disease of medial meniscus 01/13/2011   Other internal derangements of unspecified knee 01/13/2011   Knee pain 12/28/2010   Medial meniscus, posterior horn derangement 12/28/2010   Past Medical History:  Past Medical History:  Diagnosis Date   Bipolar 1 disorder ([place])    depression/anxiety   Bone spur    left heel   Cervical radiculopathy    Chronic back pain    Chronic chest wall pain    Chronic neck pain    Coronary artery disease    a. s/p CABG in 03/2016 with LIMA-LAD, SVG-D1, SVG-RCA, and Seq SVG-mid and distal OM   Diabetes mellitus    Type II   Hallucinations    "long history of them"   Headache(784.0)    History of gout    HTN (hypertension)    Insomnia    Neuropathy    Pain management    Polysubstance abuse ([place])    Right leg pain    chronic   Schizophrenia ([place])    Seizures ([place])    last sz between July 5-9th, 2016; epilepsy   Sleep apnea    Stroke ([place])    Transfusion of blood product refused for religious reason    Past Surgical History:  Past Surgical History:  Procedure Laterality Date   ANTERIOR CERVICAL DECOMP/DISCECTOMY FUSION N/A 12/14/2015   Procedure: ANTERIOR CERVICAL DECOMPRESSION/DISCECTOMY FUSION CERVICAL FIVE -SIX;  Surgeon: Earnie Larsson, MD;  Location: [facility];  Service: Neurosurgery;  Laterality: N/A;   BIOPSY  05/07/2015   Procedure: BIOPSY (Gastric);  Surgeon: [name], MD;  Location: [facility];  Service: Endoscopy;;   CARDIAC CATHETERIZATION N/A 03/07/2016   Procedure: Left Heart Cath and Coronary Angiography;  Surgeon: Belva Crome, MD;  Location: [facility];  Service: Cardiovascular;  Laterality: N/A;   COLONOSCOPY WITH PROPOFOL N/A 05/07/2015   UXN:[id number]  diverticulosis, multiple colon polyps removed, tubular adenoma, serrated colon polyp. Next colonoscopy October 2019   COLONOSCOPY WITH PROPOFOL N/A 08/02/2018   Procedure: COLONOSCOPY WITH PROPOFOL;  Surgeon: [name], MD;  Location: [facility];  Service: Endoscopy;  Laterality: N/A;  12:00pm   CORONARY ARTERY BYPASS GRAFT N/A 03/28/2016   Procedure: CORONARY ARTERY BYPASS GRAFTING (CABG) x 5 USING GREATER SAPHENOUS VEIN;  Surgeon: [name], MD;  Location: [facility];  Service: Open Heart Surgery;  Laterality: N/A;   ENDOVEIN HARVEST OF GREATER SAPHENOUS VEIN Right 03/28/2016   Procedure: ENDOVEIN HARVEST OF GREATER SAPHENOUS VEIN;  Surgeon: [name], MD;  Location: [facility];  Service: Open Heart Surgery;  Laterality: Right;   ESOPHAGEAL DILATION N/A 05/07/2015   Procedure: ESOPHAGEAL DILATION WITH 56FR MALONEY DILATOR;  Surgeon: [name], MD;  Location: [facility];  Service: Endoscopy;  Laterality: N/A;   ESOPHAGOGASTRODUODENOSCOPY (EGD) WITH PROPOFOL N/A 05/07/2015   RMR: Status post dilation of normal esophagus. Gastritis.   IR CT HEAD LTD  12/17/2020   IR CT HEAD LTD  12/17/2020   IR INTRA CRAN STENT  12/17/2020   IR PERCUTANEOUS ART THROMBECTOMY/INFUSION INTRACRANIAL INC DIAG ANGIO  12/17/2020   IR RADIOLOGIST EVAL & MGMT  03/25/2021   KNEE SURGERY Left    arthroscopy   MANDIBLE FRACTURE SURGERY     POLYPECTOMY  05/07/2015   Procedure: POLYPECTOMY (Hepatic Flexure, Distal Transverse Colon, Rectal);  Surgeon: [name], MD;  Location: [facility];  Service: Endoscopy;;   RADIOLOGY WITH ANESTHESIA N/A 12/16/2020   Procedure: IR WITH ANESTHESIA;  Surgeon: Luanne Bras, MD;  Location: [facility];  Service: Radiology;  Laterality: N/A;   TEE WITHOUT CARDIOVERSION N/A 03/28/2016   Procedure: TRANSESOPHAGEAL ECHOCARDIOGRAM (TEE);  Surgeon: [name], MD;  Location: [facility];  Service: Open Heart Surgery;  Laterality: N/A;   TONSILLECTOMY     TRIGGER FINGER RELEASE Left 10/15/2021    Procedure: RELEASE TRIGGER FINGER/A-1 PULLEY left middle or long finger;  Surgeon: Carole Civil, MD;  Location: [facility];  Service: Orthopedics;  Laterality: Left;   HPI:  57 y.o. male with medical history significant of bipolar d/o vs/ schizophrenia, chronic chest wall/neck/back pain, CAD s/p CABG, DM, HTN, polysubstance abuse, OSA, and seizure d/o presenting with AMS.  He was admitted to [facility] from 1/20-27 with AMS, minimally improving during hospitalization.  He was noted to have a persistent punctate focus of restricted diffusion in the L medial temporal lobe, concerning for acute/subacute CVA.  MRI/CT with SDH that developed during the hospitalization, 4 mm in L occipital region.  He has been on IV Keppra throughout the hospitalization without apparent seizure activity.  He is able to answer very limited questions, none about orientation.  He does follow some commands   Assessment / Plan / Recommendation Clinical Impression  Patient presents with a mixed receptive-expressive aphasia and cognitive impairment with receptive language skills appearing > expressive language skills. When completing confrontational naming, he was accurate but responses were very delayed. He was able to answer orientation question for place (hospital) when  given three choices. Affect is flat and patient does not initiate eye contact, interactions or any communication (verbal or non-verbal). He will respond to open ended questions related to basic immediate needs at word and phrase level. When asked what he did this morning he replied, "I woke up and ate". He required verbal cues to elaborate or continue responding. Patient had difficulty problem solving feeding self with left (non-dominant) hand and required moderate amount of hand over hand and tactile cues. No family present to determine baseline but patient will benefit from continued skilled SLP intervention.    SLP Assessment  SLP Recommendation/Assessment: Patient  needs continued Speech Lanaguage Pathology Services SLP Visit Diagnosis: Cognitive communication deficit (R41.841);Aphasia (R47.01)    Recommendations for follow up therapy are one component of a multi-disciplinary discharge planning process, led by the attending physician.  Recommendations may be updated based on patient status, additional functional criteria and insurance authorization.    Follow Up Recommendations  Acute inpatient rehab (3hours/day)    Assistance Recommended at Discharge  Frequent or constant Supervision/Assistance  Functional Status Assessment Patient has had a recent decline in their functional status and demonstrates the ability to make significant improvements in function in a reasonable and predictable amount of time.  Frequency and Duration min 2x/week  2 weeks      SLP Evaluation Cognition  Overall Cognitive Status: No family/caregiver present to determine baseline cognitive functioning Arousal/Alertness: Awake/alert Orientation Level: Oriented to person;Disoriented to place;Disoriented to time;Disoriented to situation Month: March Day of Week: Incorrect Attention: Sustained Sustained Attention: Impaired Sustained Attention Impairment: Verbal basic;Functional basic Memory: Impaired Memory Impairment: Other (comment) (aphasia is preventing accurate assessment of memory) Awareness: Impaired Awareness Impairment: Intellectual impairment Problem Solving: Impaired Problem Solving Impairment: Functional basic;Verbal basic Behaviors: Impulsive Safety/Judgment: Impaired       Comprehension  Auditory Comprehension Overall Auditory Comprehension: Impaired Yes/No Questions: Not tested Commands: Impaired One Step Basic Commands: 25-49% accurate Conversation: Simple Interfering Components: Attention;Processing speed;Motor planning EffectiveTechniques: Extra processing time;Visual/Gestural cues;Repetition Visual Recognition/Discrimination Discrimination: Not  tested Reading Comprehension Reading Status: Not tested    Expression Expression Primary Mode of Expression: Verbal Verbal Expression Overall Verbal Expression: Impaired Initiation: Impaired Level of Generative/Spontaneous Verbalization: Word;Phrase Naming: Impairment Responsive: Not tested Confrontation: Impaired Convergent: Not tested Divergent: Not tested Pragmatics: Impairment Impairments: Abnormal affect;Eye contact;Monotone Interfering Components: Attention;Premorbid deficit Effective Techniques: Open ended questions;Other (Comment) (three choice cues) Non-Verbal Means of Communication: Not applicable Written Expression Dominant Hand: Right Written Expression: Not tested   Oral / Motor  Oral Motor/Sensory Function Overall Oral Motor/Sensory Function: Generalized oral weakness            Sonia Baller, MA, CCC-SLP Speech Therapy

## 2022-09-01 ENCOUNTER — Inpatient Hospital Stay (HOSPITAL_COMMUNITY): Payer: Medicaid Other

## 2022-09-01 ENCOUNTER — Encounter (HOSPITAL_COMMUNITY)
Admission: RE | Admit: 2022-09-01 | Discharge: 2022-09-01 | Disposition: A | Discharge status: Home / Self Care | Payer: Medicaid Other | Source: Ambulatory Visit | Attending: Nephrology | Admitting: Nephrology

## 2022-09-01 DIAGNOSIS — D631 Anemia in chronic kidney disease: Secondary | ICD-10-CM | POA: Insufficient documentation

## 2022-09-01 DIAGNOSIS — N1832 Chronic kidney disease, stage 3b: Secondary | ICD-10-CM | POA: Insufficient documentation

## 2022-09-01 DIAGNOSIS — G9341 Metabolic encephalopathy: Secondary | ICD-10-CM | POA: Diagnosis not present

## 2022-09-01 LAB — CBC WITH DIFFERENTIAL/PLATELET
Abs Immature Granulocytes: 0.06 10*3/uL (ref 0.00–0.07)
Basophils Absolute: 0 10*3/uL (ref 0.0–0.1)
Basophils Relative: 0 %
Eosinophils Absolute: 0.1 10*3/uL (ref 0.0–0.5)
Eosinophils Relative: 2 %
HCT: 28.5 % — ABNORMAL LOW (ref 39.0–52.0)
Hemoglobin: 10.1 g/dL — ABNORMAL LOW (ref 13.0–17.0)
Immature Granulocytes: 1 %
Lymphocytes Relative: 51 %
Lymphs Abs: 3 10*3/uL (ref 0.7–4.0)
MCH: 33.1 pg (ref 26.0–34.0)
MCHC: 35.4 g/dL (ref 30.0–36.0)
MCV: 93.4 fL (ref 80.0–100.0)
Monocytes Absolute: 1 10*3/uL (ref 0.1–1.0)
Monocytes Relative: 16 %
Neutro Abs: 1.8 10*3/uL (ref 1.7–7.7)
Neutrophils Relative %: 30 %
Platelets: 175 10*3/uL (ref 150–400)
RBC: 3.05 MIL/uL — ABNORMAL LOW (ref 4.22–5.81)
RDW: 18.3 % — ABNORMAL HIGH (ref 11.5–15.5)
WBC: 5.9 10*3/uL (ref 4.0–10.5)
nRBC: 0 % (ref 0.0–0.2)

## 2022-09-01 LAB — COMPREHENSIVE METABOLIC PANEL WITH GFR
ALT: 14 U/L (ref 0–44)
AST: 26 U/L (ref 15–41)
Albumin: 2.7 g/dL — ABNORMAL LOW (ref 3.5–5.0)
Alkaline Phosphatase: 38 U/L (ref 38–126)
Anion gap: 7 (ref 5–15)
BUN: 12 mg/dL (ref 6–20)
CO2: 22 mmol/L (ref 22–32)
Calcium: 7.9 mg/dL — ABNORMAL LOW (ref 8.9–10.3)
Chloride: 104 mmol/L (ref 98–111)
Creatinine, Ser: 1.03 mg/dL (ref 0.61–1.24)
GFR, Estimated: >60 mL/min
Glucose, Bld: 62 mg/dL — ABNORMAL LOW (ref 70–99)
Potassium: 4.6 mmol/L (ref 3.5–5.1)
Sodium: 133 mmol/L — ABNORMAL LOW (ref 135–145)
Total Bilirubin: 1.2 mg/dL (ref 0.3–1.2)
Total Protein: 5.3 g/dL — ABNORMAL LOW (ref 6.5–8.1)

## 2022-09-01 LAB — GLUCOSE, CAPILLARY
Glucose-Capillary: 64 mg/dL — ABNORMAL LOW (ref 70–99)
Glucose-Capillary: 72 mg/dL (ref 70–99)
Glucose-Capillary: 107 mg/dL — ABNORMAL HIGH (ref 70–99)
Glucose-Capillary: 88 mg/dL (ref 70–99)
Glucose-Capillary: 132 mg/dL — ABNORMAL HIGH (ref 70–99)

## 2022-09-01 NOTE — Discharge Instructions (Addendum)
Inpatient Rehab Discharge Instructions  Melvin Taylor Discharge date and time: No discharge date for patient encounter.   Activities/Precautions/ Functional Status: Activity: activity as tolerated Diet: Dysphagia #2 nectar liquids Wound Care: Routine skin checks Functional status:  ___ No restrictions     ___ Walk up steps independently ___ 24/7 supervision/assistance   ___ Walk up steps with assistance ___ Intermittent supervision/assistance  ___ Bathe/dress independently ___ Walk with walker     _x__ Bathe/dress with assistance ___ Walk Independently    ___ Shower independently ___ Walk with assistance    ___ Shower with assistance ___ No alcohol     ___ Return to work/school ________  Special Instructions:  No driving smoking or alcohol  Continue to hold Plavix for small trace subdural hematoma until follow-up neurology services  My questions have been answered and I understand these instructions. I will adhere to these goals and the provided educational materials after my discharge from the hospital.  Patient/Caregiver Signature _______________________________ Date __________  Clinician Signature _______________________________________ Date __________  Please bring this form and your medication list with you to all your follow-up doctor's appointments.

## 2022-09-01 NOTE — Plan of Care (Signed)
Problem: RH Balance Goal: LTG: Patient will maintain dynamic sitting balance (OT) Description: LTG:  Patient will maintain dynamic sitting balance with assistance during activities of daily living (OT) Flowsheets (Taken 09/01/2022 1841) LTG: Pt will maintain dynamic sitting balance during ADLs with: Supervision/Verbal cueing Goal: LTG Patient will maintain dynamic standing with ADLs (OT) Description: LTG:  Patient will maintain dynamic standing balance with assist during activities of daily living (OT)  Flowsheets (Taken 09/01/2022 1841) LTG: Pt will maintain dynamic standing balance during ADLs with: Contact Guard/Touching assist   Problem: Sit to Stand Goal: LTG:  Patient will perform sit to stand in prep for activites of daily living with assistance level (OT) Description: LTG:  Patient will perform sit to stand in prep for activites of daily living with assistance level (OT) Flowsheets (Taken 09/01/2022 1841) LTG: PT will perform sit to stand in prep for activites of daily living with assistance level: Supervision/Verbal cueing   Problem: RH Eating Goal: LTG Patient will perform eating w/assist, cues/equip (OT) Description: LTG: Patient will perform eating with assist, with/without cues using equipment (OT) Flowsheets (Taken 09/01/2022 1841) LTG: Pt will perform eating with assistance level of: Supervision/Verbal cueing   Problem: RH Grooming Goal: LTG Patient will perform grooming w/assist,cues/equip (OT) Description: LTG: Patient will perform grooming with assist, with/without cues using equipment (OT) Flowsheets (Taken 09/01/2022 1841) LTG: Pt will perform grooming with assistance level of: Supervision/Verbal cueing   Problem: RH Bathing Goal: LTG Patient will bathe all body parts with assist levels (OT) Description: LTG: Patient will bathe all body parts with assist levels (OT) Flowsheets (Taken 09/01/2022 1841) LTG: Pt will perform bathing with assistance level/cueing: Minimal  Assistance - Patient > 75% LTG: Position pt will perform bathing: Shower   Problem: RH Dressing Goal: LTG Patient will perform upper body dressing (OT) Description: LTG Patient will perform upper body dressing with assist, with/without cues (OT). Flowsheets (Taken 09/01/2022 1841) LTG: Pt will perform upper body dressing with assistance level of: Supervision/Verbal cueing Goal: LTG Patient will perform lower body dressing w/assist (OT) Description: LTG: Patient will perform lower body dressing with assist, with/without cues in positioning using equipment (OT) Flowsheets (Taken 09/01/2022 1841) LTG: Pt will perform lower body dressing with assistance level of: Contact Guard/Touching assist   Problem: RH Toileting Goal: LTG Patient will perform toileting task (3/3 steps) with assistance level (OT) Description: LTG: Patient will perform toileting task (3/3 steps) with assistance level (OT)  Flowsheets (Taken 09/01/2022 1841) LTG: Pt will perform toileting task (3/3 steps) with assistance level: Contact Guard/Touching assist   Problem: RH Functional Use of Upper Extremity Goal: LTG Patient will use RT/LT upper extremity as a (OT) Description: LTG: Patient will use right/left upper extremity as a stabilizer/gross assist/diminished/nondominant/dominant level with assist, with/without cues during functional activity (OT) Flowsheets (Taken 09/01/2022 1843) LTG: Use of upper extremity in functional activities: RUE as dominant level LTG: Pt will use upper extremity in functional activity with assistance level of: Contact Guard/Touching assist   Problem: RH Toilet Transfers Goal: LTG Patient will perform toilet transfers w/assist (OT) Description: LTG: Patient will perform toilet transfers with assist, with/without cues using equipment (OT) Flowsheets (Taken 09/01/2022 1843) LTG: Pt will perform toilet transfers with assistance level of: Contact Guard/Touching assist   Problem: RH Tub/Shower  Transfers Goal: LTG Patient will perform tub/shower transfers w/assist (OT) Description: LTG: Patient will perform tub/shower transfers with assist, with/without cues using equipment (OT) Flowsheets (Taken 09/01/2022 1843) LTG: Pt will perform tub/shower stall transfers with assistance level of:  Contact Guard/Touching assist   Problem: RH Attention Goal: LTG Patient will demonstrate this level of attention during functional activites (OT) Description: LTG:  Patient will demonstrate this level of attention during functional activites  (OT) Flowsheets (Taken 09/01/2022 1843) Patient will demonstrate this level of attention during functional activites: Sustained Patient will demonstrate above attention level in the following environment: Home LTG: Patient will demonstrate this level of attention during functional activites (OT): Supervision

## 2022-09-01 NOTE — Progress Notes (Signed)
Inpatient Rehabilitation Care Coordinator Assessment and Plan Patient Details  Name: Melvin Taylor MRN: [id number] Date of Birth: 1966-01-22  Today's Date: 09/01/2022  Hospital Problems: Principal Problem:   Acute metabolic encephalopathy Active Problems:   Primary hypertension   Hyperlipidemia  Past Medical History:  Past Medical History:  Diagnosis Date   Bipolar 1 disorder ([place])    depression/anxiety   Bone spur    left heel   Cervical radiculopathy    Chronic back pain    Chronic chest wall pain    Chronic neck pain    Coronary artery disease    a. s/p CABG in 03/2016 with LIMA-LAD, SVG-D1, SVG-RCA, and Seq SVG-mid and distal OM   Diabetes mellitus    Type II   Hallucinations    "long history of them"   Headache(784.0)    History of gout    HTN (hypertension)    Insomnia    Neuropathy    Pain management    Polysubstance abuse ([place])    Right leg pain    chronic   Schizophrenia ([place])    Seizures ([place])    last sz between July 5-9th, 2016; epilepsy   Sleep apnea    Stroke ([place])    Transfusion of blood product refused for religious reason    Past Surgical History:  Past Surgical History:  Procedure Laterality Date   ANTERIOR CERVICAL DECOMP/DISCECTOMY FUSION N/A 12/14/2015   Procedure: ANTERIOR CERVICAL DECOMPRESSION/DISCECTOMY FUSION CERVICAL FIVE -SIX;  Surgeon: Earnie Larsson, MD;  Location: [facility];  Service: Neurosurgery;  Laterality: N/A;   BIOPSY  05/07/2015   Procedure: BIOPSY (Gastric);  Surgeon: [name], MD;  Location: [facility];  Service: Endoscopy;;   CARDIAC CATHETERIZATION N/A 03/07/2016   Procedure: Left Heart Cath and Coronary Angiography;  Surgeon: Belva Crome, MD;  Location: [facility];  Service: Cardiovascular;  Laterality: N/A;   COLONOSCOPY WITH PROPOFOL N/A 05/07/2015   KDT:[id number] diverticulosis, multiple colon polyps removed, tubular adenoma, serrated colon polyp. Next colonoscopy October 2019   COLONOSCOPY WITH PROPOFOL  N/A 08/02/2018   Procedure: COLONOSCOPY WITH PROPOFOL;  Surgeon: [name], MD;  Location: [facility];  Service: Endoscopy;  Laterality: N/A;  12:00pm   CORONARY ARTERY BYPASS GRAFT N/A 03/28/2016   Procedure: CORONARY ARTERY BYPASS GRAFTING (CABG) x 5 USING GREATER SAPHENOUS VEIN;  Surgeon: [name], MD;  Location: [facility];  Service: Open Heart Surgery;  Laterality: N/A;   ENDOVEIN HARVEST OF GREATER SAPHENOUS VEIN Right 03/28/2016   Procedure: ENDOVEIN HARVEST OF GREATER SAPHENOUS VEIN;  Surgeon: [name], MD;  Location: [facility];  Service: Open Heart Surgery;  Laterality: Right;   ESOPHAGEAL DILATION N/A 05/07/2015   Procedure: ESOPHAGEAL DILATION WITH 56FR MALONEY DILATOR;  Surgeon: [name], MD;  Location: [facility];  Service: Endoscopy;  Laterality: N/A;   ESOPHAGOGASTRODUODENOSCOPY (EGD) WITH PROPOFOL N/A 05/07/2015   RMR: Status post dilation of normal esophagus. Gastritis.   IR CT HEAD LTD  12/17/2020   IR CT HEAD LTD  12/17/2020   IR INTRA CRAN STENT  12/17/2020   IR PERCUTANEOUS ART THROMBECTOMY/INFUSION INTRACRANIAL INC DIAG ANGIO  12/17/2020   IR RADIOLOGIST EVAL & MGMT  03/25/2021   KNEE SURGERY Left    arthroscopy   MANDIBLE FRACTURE SURGERY     POLYPECTOMY  05/07/2015   Procedure: POLYPECTOMY (Hepatic Flexure, Distal Transverse Colon, Rectal);  Surgeon: [name], MD;  Location: [facility];  Service: Endoscopy;;   RADIOLOGY  WITH ANESTHESIA N/A 12/16/2020   Procedure: IR WITH ANESTHESIA;  Surgeon: Luanne Bras, MD;  Location: [facility];  Service: Radiology;  Laterality: N/A;   TEE WITHOUT CARDIOVERSION N/A 03/28/2016   Procedure: TRANSESOPHAGEAL ECHOCARDIOGRAM (TEE);  Surgeon: [name], MD;  Location: [facility];  Service: Open Heart Surgery;  Laterality: N/A;   TONSILLECTOMY     TRIGGER FINGER RELEASE Left 10/15/2021   Procedure: RELEASE TRIGGER FINGER/A-1 PULLEY left middle or long finger;  Surgeon: Carole Civil, MD;  Location: [facility];  Service: Orthopedics;   Laterality: Left;   Social History:  reports that he has quit smoking. His smoking use included cigarettes. He has a 15.00 pack-year smoking history. He has never used smokeless tobacco. He reports that he does not currently use alcohol. He reports that he does not currently use drugs after having used the following drugs: Marijuana.  Family / Support Systems Marital Status: Single Patient Roles: Other (Comment) Spouse/Significant Other: N/A Children: N/A Other Supports: sisters, niece, nephew Anticipated Caregiver: [name] [phone] Ability/Limitations of Caregiver: Min A Caregiver Availability: 24/7 Family Dynamics: Support from family  Social History Preferred language: English Religion: Jehovah's Witness Cultural Background: support from all of family (sister, aunt, nieces, nephew) Education: [place] - How often do you need to have someone help you when you read instructions, pamphlets, or other written material from your doctor or pharmacy?: Often Writes: Yes Employment Status: Unemployed Date Retired/Disabled/Unemployed: unknown Public relations account executive Issues: N/A Guardian/Conservator: Product manager   Abuse/Neglect Abuse/Neglect Assessment Can Be Completed: Unable to assess, patient is non-responsive or altered mental status (Patient declines)  Patient response to: Social Isolation - How often do you feel lonely or isolated from those around you?: Patient declines to respond  Emotional Status Pt's affect, behavior and adjustment status: Patient covering face, preferring not to respond. Family reporting answers.Personal history of noncompliance with medical treatment, Recent Psychosocial Issues: coping, behvaior Psychiatric History: hx of Schizophrenia and smoking Substance Abuse History: hx of smoking  Patient / Family Perceptions, Expectations & Goals Pt/Family understanding of illness & functional limitations: yes- family present Premorbid  pt/family roles/activities: Previously living independent with assistance from caregiver and family checking in throughout the day Anticipated changes in roles/activities/participation: Family anticpates to discharge home with careigvers services and sisters to assist daily and uncle to stay with patient at night Pt/family expectations/goals: Supervision/Min Luis M. Cintron: None Premorbid Home Care/DME Agencies: None Transportation available at discharge: Family able to transport Is the patient able to respond to transportation needs?: Other (comment) (Sister presenting responses) In the past 12 months, has lack of transportation kept you from medical appointments or from getting medications?: No In the past 12 months, has lack of transportation kept you from meetings, work, or from getting things needed for daily living?: No Resource referrals recommended: Neuropsychology  Discharge Planning Living Arrangements: Alone Support Systems: Other relatives, Home care staff Type of Residence: Private residence (2 level home, Media planner entrance) Insurance Resources: [employer] (specify county) Museum/gallery curator Resources: [employer] Financial Screen Referred: No Living Expenses: Education officer, community Management: Patient, Family Does the patient have any problems obtaining your medications?: No Home Management: Ship broker Preliminary Plans: Caregiver and family able to assist with cogntive tasks Care Coordinator Barriers to Discharge: Insurance for SNF coverage, Decreased caregiver support, Lack of/limited family support Care Coordinator Anticipated Follow Up Needs: HH/OP Expected length of stay: 16-18 Days  Clinical Impression Sw met with patient and sister at bedside, introduced self and explained role. Patient  placed covers over face allowing sister to report responses. Patient anticipates discharging back home with caregiver assistance and assistance from his two  sisters, uncle, niece and nephew. Patient lives independent but has caregivers services that come in for about 50 hours a week and family assist patient during other times. Patient has no steps to enter and has elevator to get to 2nd level. Patient added to neuropsych. No additional questions or concerns.  Dyanne Iha 09/01/2022, 1:24 PM

## 2022-09-01 NOTE — Evaluation (Signed)
Occupational Therapy Assessment and Plan  Patient Details  Name: Melvin Taylor MRN: [id number] Date of Birth: 1965-10-24  OT Diagnosis: abnormal posture, acute pain, altered mental status, cognitive deficits, hemiplegia affecting dominant side, muscle weakness (generalized), and pain in joint Rehab Potential: Rehab Potential (ACUTE ONLY): Good ELOS: 2 weeks   Today's Date: 09/01/2022 OT Individual Time: 0930-1030 OT Individual Time Calculation (min): 60 min     Hospital Problem: Principal Problem:   Acute metabolic encephalopathy Active Problems:   Primary hypertension   Hyperlipidemia   Past Medical History:  Past Medical History:  Diagnosis Date   Bipolar 1 disorder ([place])    depression/anxiety   Bone spur    left heel   Cervical radiculopathy    Chronic back pain    Chronic chest wall pain    Chronic neck pain    Coronary artery disease    a. s/p CABG in 03/2016 with LIMA-LAD, SVG-D1, SVG-RCA, and Seq SVG-mid and distal OM   Diabetes mellitus    Type II   Hallucinations    "long history of them"   Headache(784.0)    History of gout    HTN (hypertension)    Insomnia    Neuropathy    Pain management    Polysubstance abuse ([place])    Right leg pain    chronic   Schizophrenia ([place])    Seizures ([place])    last sz between July 5-9th, 2016; epilepsy   Sleep apnea    Stroke ([place])    Transfusion of blood product refused for religious reason    Past Surgical History:  Past Surgical History:  Procedure Laterality Date   ANTERIOR CERVICAL DECOMP/DISCECTOMY FUSION N/A 12/14/2015   Procedure: ANTERIOR CERVICAL DECOMPRESSION/DISCECTOMY FUSION CERVICAL FIVE -SIX;  Surgeon: Earnie Larsson, MD;  [facility]: [facility];  Service: Neurosurgery;  Laterality: N/A;   BIOPSY  05/07/2015   Procedure: BIOPSY (Gastric);  Surgeon: [name], MD;  Location: [facility];  Service: Endoscopy;;   CARDIAC CATHETERIZATION N/A 03/07/2016   Procedure: Left Heart Cath and Coronary Angiography;   Surgeon: Belva Crome, MD;  Location: [place] [facility];  Service: Cardiovascular;  Laterality: N/A;   COLONOSCOPY WITH PROPOFOL N/A 05/07/2015   GYJ:EHUDJSHFWY diverticulosis, multiple colon polyps removed, tubular adenoma, serrated colon polyp. Next colonoscopy October 2019   COLONOSCOPY WITH PROPOFOL N/A 08/02/2018   Procedure: COLONOSCOPY WITH PROPOFOL;  Surgeon: [name], MD;  [facility]: [facility];  Service: Endoscopy;  Laterality: N/A;  12:00pm   CORONARY ARTERY BYPASS GRAFT N/A 03/28/2016   Procedure: CORONARY ARTERY BYPASS GRAFTING (CABG) x 5 USING GREATER SAPHENOUS VEIN;  Surgeon: [name], MD;  Location: [place] [facility];  Service: Open Heart Surgery;  Laterality: N/A;   ENDOVEIN HARVEST OF GREATER SAPHENOUS VEIN Right 03/28/2016   Procedure: ENDOVEIN HARVEST OF GREATER SAPHENOUS VEIN;  Surgeon: [name], MD;  Location: [place];  Service: Open Heart Surgery;  Laterality: Right;   ESOPHAGEAL DILATION N/A 05/07/2015   Procedure: ESOPHAGEAL DILATION WITH 56FR MALONEY DILATOR;  Surgeon: [name], MD;  Location: [facility];  Service: Endoscopy;  Laterality: N/A;   ESOPHAGOGASTRODUODENOSCOPY (EGD) WITH PROPOFOL N/A 05/07/2015   RMR: Status post dilation of normal esophagus. Gastritis.   IR CT HEAD LTD  12/17/2020   IR CT HEAD LTD  12/17/2020   IR INTRA CRAN STENT  12/17/2020   IR PERCUTANEOUS ART THROMBECTOMY/INFUSION INTRACRANIAL INC DIAG ANGIO  12/17/2020   IR RADIOLOGIST EVAL & MGMT  03/25/2021   KNEE SURGERY Left    arthroscopy   MANDIBLE FRACTURE SURGERY     POLYPECTOMY  05/07/2015   Procedure: POLYPECTOMY (Hepatic Flexure, Distal Transverse Colon, Rectal);  Surgeon: [name], MD;  Location: [facility];  Service: Endoscopy;;   RADIOLOGY WITH ANESTHESIA N/A 12/16/2020   Procedure: IR WITH ANESTHESIA;  Surgeon: Luanne Bras, MD;  Location: [place];  Service: Radiology;  Laterality: N/A;   TEE WITHOUT CARDIOVERSION N/A 03/28/2016   Procedure: TRANSESOPHAGEAL ECHOCARDIOGRAM (TEE);   Surgeon: [name], MD;  Location: [place];  Service: Open Heart Surgery;  Laterality: N/A;   TONSILLECTOMY     TRIGGER FINGER RELEASE Left 10/15/2021   Procedure: RELEASE TRIGGER FINGER/A-1 PULLEY left middle or long finger;  Surgeon: Carole Civil, MD;  Location: [facility];  Service: Orthopedics;  Laterality: Left;    Assessment & Plan Clinical Impression:  Patient is a 57 y.o. year old right-handed male with history of bipolar disorder/schizophrenia, CAD/CABG, diabetes mellitus, hypertension, polysubstance abuse, OSA and will not use CPAP and seizure disorder//epilepsy maintained on Depakote as well as Lamictal as well as left MCA infarction with stenting maintained on Brilinta as well as Plavix receiving inpatient rehab services 12/21/2020 - 12/30/2020.  He was discharged to home ambulating 150 feet without assistive device contact-guard.  Per chart review patient lives alone.  1 level apartment with elevator.  Has a home health aide 53 hours a week to assist with making meals dressing bathing managing medications.  Sister does finances and his POA.  Presented 08/27/2022 from outside hospital after admission 1/20-27 with altered mental status.  He was noted to have persistent punctate focus of restricted diffusion in the left temporal medial lobe concerning for acute/subacute CVA on imaging at outside hospital.  MRI showed trace SDH that developed during the hospitalization 4 mm in the left occipital region.  He was transferred to [facility].  Cranial CT scan showed stable trace subdural hematoma along the posterior falx and posteriorly occipital lobe.  Stable remote infarcts of the left basal ganglia, anterior left frontal lobe and corona radiata but no acute CVA noted.  Stable left MCA stent.  Admission chemistries unremarkable except potassium 3.2, BUN 39, hemoglobin 9.2, valproic acid level 66.  Neurology follow-up patient currently remains on Brilinta as prior to admission but Plavix on  hold.  Neurology felt that  risk of stent restenosis if there is prolonged cessation of Brilinta would most likely outweigh the risk of hemorrhage.  Neurosurgery signed off due to small size and stable appearance of subdural hematoma.  EEG negative for seizure currently remains on Keppra which had been started at outside hospital.  His Depakote and Lamictal prior to admission are currently on hold.  Echocardiogram ejection fraction of 60 to 65% no wall motion abnormalities grade 1 diastolic dysfunction.  Hospital course COVID questionably incidental finding patient asymptomatic and completed antiviral/steroid course and maintained on airborne contact precautions.  He has completed his quarantine today.  Currently on dysphagia #2 nectar thick liquid diet.  Patient reports pain in his legs however he is unable to provide many details regarding this issue.  He does admit to history of diabetes with polyneuropathy.  Therapy evaluations completed due to patient decreased functional mobility altered mental status/dysphagia was admitted for a comprehensive rehab program. Patient transferred to CIR on 08/31/2022.  Patient transferred to CIR on 08/31/2022   Patient currently requires max with basic self-care skills and IADL secondary to muscle weakness, muscle joint  tightness, and muscle paralysis, decreased cardiorespiratoy endurance, impaired timing and sequencing, abnormal tone, unbalanced muscle activation, motor apraxia, decreased coordination, and decreased motor planning, decreased initiation, decreased attention, decreased awareness, decreased problem solving, decreased safety awareness, decreased memory, and delayed processing, and decreased sitting balance, decreased standing balance, decreased postural control, hemiplegia, and decreased balance strategies.  Prior to hospitalization, patient could complete BADL with supervision to min A.  Patient will benefit from skilled intervention to decrease level of assist  with basic self-care skills, increase independence with basic self-care skills, and increase level of independence with iADL prior to discharge home with care partner.  Anticipate patient will require 24 hour supervision and follow up home health.  OT - End of Session Activity Tolerance: Decreased this session Endurance Deficit: Yes Endurance Deficit Description: pt with impaired endurance but also has decreased engagement OT Assessment Rehab Potential (ACUTE ONLY): Good OT Patient demonstrates impairments in the following area(s): Balance;Endurance;Safety;Motor;Behavior;Cognition;Pain;Skin Integrity OT Basic ADL's Functional Problem(s): Eating;Grooming;Bathing;Dressing;Toileting OT Transfers Functional Problem(s): Tub/Shower;Toilet OT Additional Impairment(s): Fuctional Use of Upper Extremity OT Plan OT Intensity: Minimum of 1-2 x/day, 45 to 90 minutes OT Frequency: 5 out of 7 days OT Duration/Estimated Length of Stay: 2 weeks OT Treatment/Interventions: Balance/vestibular training;Community reintegration;Disease mangement/prevention;Functional electrical stimulation;Neuromuscular re-education;Patient/family education;Self Care/advanced ADL retraining;Therapeutic Exercise;UE/LE Coordination activities;Wheelchair propulsion/positioning;Cognitive remediation/compensation;Discharge planning;DME/adaptive equipment instruction;Functional mobility training;Pain management;Psychosocial support;Skin care/wound managment;Therapeutic Activities;UE/LE Strength taining/ROM;Visual/perceptual remediation/compensation OT Self Feeding Anticipated Outcome(s): Supervision OT Basic Self-Care Anticipated Outcome(s): Supervision to min A OT Toileting Anticipated Outcome(s): Min A OT Bathroom Transfers Anticipated Outcome(s): Supervision OT Recommendation Recommendations for Other Services: Neuropsych consult Patient destination: Home Follow Up Recommendations: Home health OT Equipment Recommended: To be  determined   OT Evaluation Skilled Therapeutic Interventions/Progress Updates:  1:1 OT evaluation and intervention initiated with skilled education provided on OT role, goals, and POC. Pt presenting with aphasia and flat affect this session impacting participation in session. OT will continue ongoing evaluation during Pt stay to continue to provide appropriate interventions and recommendations for d/c. Pt hx gathered form chart as Pt unable to answer questions accurately this session and no family present. Pt completed BADLs this session at functional levels listed below with skilled interventions including increased time, cueing, functional transfer training, BADL retraining with modifications, pain management, and d/c planning. Pt would benefit from ongoing IPR services to increase independence in BADLs.   Precautions/Restrictions  Precautions Precautions: Fall;Other (comment) Precaution Comments: R hemiplegia with R UE flexor tone and R LE extensor tone (LE tone primarily in the bed), Aphasia, delayed processing Required Braces or Orthoses: Other Brace Other Brace: R UE aircast for tone management Restrictions Weight Bearing Restrictions: No General Chart Reviewed: Yes Additional Pertinent History: Previous L MCA, seizure disorder/epilespy Family/Caregiver Present: No Vital Signs  Pain Pain Assessment Pain Scale: Faces Faces Pain Scale: Hurts even more (when performing R LE hip/knee flexion) Pain Type: Other (Comment) (pain only with this movement when supine in bed) Pain Location: Leg Pain Orientation: Right Pain Descriptors / Indicators: Spasm;Other (Comment) (tone response) Pain Onset: Other (Comment) (only with this movement but calmed at rest in hooklying position) Pain Intervention(s): Rest;Repositioned Home Living/Prior Functioning Home Living Family/patient expects to be discharged to:: Private residence Living Arrangements: Alone Available Help at Discharge: Family,  Available 24 hours/day Type of Home: Apartment Home Access: Elevator Home Layout: Other (Comment) (2nd floor apartment) Bathroom Shower/Tub: Chiropodist: Standard Bathroom Accessibility: Yes Additional Comments: per SLP convo with his sister and chart review pt was independent for basic functional  mobility at household level using cane only for community mobility  Lives With: Alone (per chart review; Pt unable to report d/t decreased verbalization) IADL History Homemaking Responsibilities: No Current License: No Occupation: On disability, Retired Prior Function Level of Independence: Independent with gait, Independent with transfers, Needs assistance with ADLs (per chart review) Bath: Minimal Dressing: Minimal Meal Prep: Total Laundry: Total Vacuuming: Total Light Housekeeping: Total Gardening: Total Yard Work: Total Shopping: Total  Able to Take Stairs?: Yes Driving: No Vocation: Retired Surveyor, mining Baseline Vision/History: 0 No visual deficits (pt not participating in formal assessment, but able to read OT name tag and see items for BADL tasks) Ability to See in Adequate Light: 0 Adequate Patient Visual Report: No change from baseline Vision Assessment?: No apparent visual deficits (ongoing assessment during POC as Pt participation increases) Perception  Perception: Within Functional Limits Praxis Praxis: Impaired Praxis Impairment Details: Initiation Praxis-Other Comments: delayed initiation Cognition Cognition Overall Cognitive Status: History of cognitive impairments - at baseline (difficult to assess d/t Pt limited particiaption in session. Pt requiring increased time to warm up to new people. Will continue to guild raport during POC) Arousal/Alertness: Awake/alert Orientation Level: Person Memory: Impaired Memory Impairment: Other (comment) (difficult to assess d/t aphasia) Attention: Focused;Sustained Focused Attention: Appears intact Sustained  Attention: Impaired Sustained Attention Impairment: Verbal basic;Functional basic Awareness: Impaired Awareness Impairment: Intellectual impairment Problem Solving: Impaired Problem Solving Impairment: Functional basic;Verbal basic Executive Function:  (all impaired d/t lower level impairements present) Behaviors: Poor frustration tolerance Safety/Judgment: Impaired Brief Interview for Mental Status (BIMS) Repetition of Three Words (First Attempt): 2 Temporal Orientation: Year: No answer Temporal Orientation: Month: No answer Temporal Orientation: Day: No answer Recall: "Sock": No, could not recall Recall: "Blue": No, could not recall Recall: "Bed": No, could not recall BIMS Summary Score: 2 Sensation Sensation Light Touch: Impaired by gross assessment Hot/Cold: Not tested Proprioception: Appears Intact Stereognosis: Not tested Coordination Gross Motor Movements are Fluid and Coordinated: No Fine Motor Movements are Fluid and Coordinated: No Coordination and Movement Description: impaired due to R hemiplegia with abnormal tone and imbalance balance Finger Nose Finger Test: Pt unable to follow directions for assessment Motor  Motor Motor: Abnormal tone;Abnormal postural alignment and control;Hemiplegia Motor - Skilled Clinical Observations: R hemiplegia, R UE flexor tone, R LE extensor tone primarily in supine  Trunk/Postural Assessment  Cervical Assessment Cervical Assessment: Exceptions to [facility] (forward head) Thoracic Assessment Thoracic Assessment: Exceptions to [facility] (rounded shoudlers) Lumbar Assessment Lumbar Assessment: Exceptions to [facility] (posterior pelvic tilt) Postural Control Postural Control: Deficits on evaluation Righting Reactions: delayed and inadequate for minor or more perturbations Postural Limitations: decreased  Balance Balance Balance Assessed: Yes Static Sitting Balance Static Sitting - Balance Support: Feet supported;Left upper extremity  supported Static Sitting - Level of Assistance: Other (comment) (CGA) Dynamic Sitting Balance Dynamic Sitting - Balance Support: Feet supported Dynamic Sitting - Level of Assistance: 3: Mod assist Static Standing Balance Static Standing - Balance Support: During functional activity;Left upper extremity supported Static Standing - Level of Assistance: 4: Min assist;Other (comment) Dynamic Standing Balance Dynamic Standing - Balance Support: During functional activity;Bilateral upper extremity supported Dynamic Standing - Level of Assistance: 3: Mod assist;4: Min assist Extremity/Trunk Assessment RUE Assessment RUE Assessment: Exceptions to [facility] Passive Range of Motion (PROM) Comments: Limited d/t pain Active Range of Motion (AROM) Comments: Limited d/t pain General Strength Comments: Difficult to assess d/t RUE pain and potential for fx during fall at home. Will continue to assess as able. LUE Assessment LUE Assessment: Exceptions  to [facility] General Strength Comments: 4/5 d/t general deconditioning  Care Tool Care Tool Self Care Eating   Eating Assist Level: Dependent - Patient 0%    Oral Care    Oral Care Assist Level: Total assistance - Patient < 25%    Bathing   Body parts bathed by patient: Chest;Abdomen;Right upper leg;Left upper leg;Face Body parts bathed by helper: Front perineal area;Buttocks;Left lower leg;Right lower leg;Left arm;Right arm   Assist Level: Maximal Assistance - Patient 24 - 49%    Upper Body Dressing(including orthotics)   What is the patient wearing?: Pull over shirt   Assist Level: Maximal Assistance - Patient 25 - 49%    Lower Body Dressing (excluding footwear)   What is the patient wearing?: Underwear/pull up;Pants Assist for lower body dressing: Maximal Assistance - Patient 25 - 49%    Putting on/Taking off footwear   What is the patient wearing?: Shoes;Socks Assist for footwear: Dependent - Patient 0%       Care Tool Toileting Toileting  activity   Assist for toileting: Dependent - Patient 0%     Care Tool Bed Mobility Roll left and right activity   Roll left and right assist level: Moderate Assistance - Patient 50 - 74%    Sit to lying activity   Sit to lying assist level: Minimal Assistance - Patient > 75%    Lying to sitting on side of bed activity   Lying to sitting on side of bed assist level: the ability to move from lying on the back to sitting on the side of the bed with no back support.: Moderate Assistance - Patient 50 - 74%     Care Tool Transfers Sit to stand transfer   Sit to stand assist level: Moderate Assistance - Patient 50 - 74%    Chair/bed transfer   Chair/bed transfer assist level: Moderate Assistance - Patient 50 - 74%     Toilet transfer   Assist Level: Moderate Assistance - Patient 50 - 74%     Care Tool Cognition  Expression of Ideas and Wants Expression of Ideas and Wants: 1. Rarely/Never expressess or very difficult - rarely/never expresses self or speech is very difficult to understand  Understanding Verbal and Non-Verbal Content Understanding Verbal and Non-Verbal Content: 2. Sometimes understands - understands only basic conversations or simple, direct phrases. Frequently requires cues to understand   Memory/Recall Ability Memory/Recall Ability : None of the above were recalled   Refer to Care Plan for Long Term Goals  SHORT TERM GOAL WEEK 1 OT Short Term Goal 1 (Week 1): Pt will consistently complete toilet transfer min A sing LRAD OT Short Term Goal 2 (Week 1): Pt will utilize RUE during fucntional tasks ~50% of time with mod cueing OT Short Term Goal 3 (Week 1): Pt will maintain dynamic sitting balance CGA using LRAD OT Short Term Goal 4 (Week 1): Pt will sustain attention during BADL/functional task in quiet environment with mod cueing  Recommendations for other services: None    Skilled Therapeutic Intervention ADL ADL Eating: Not assessed Grooming: Maximal  assistance Where Assessed-Grooming: Edge of bed Upper Body Bathing: Moderate assistance Where Assessed-Upper Body Bathing: Edge of bed Lower Body Bathing: Maximal assistance Where Assessed-Lower Body Bathing: Edge of bed Upper Body Dressing: Maximal assistance Where Assessed-Upper Body Dressing: Edge of bed Lower Body Dressing: Maximal assistance Where Assessed-Lower Body Dressing: Edge of bed Toileting: Dependent Where Assessed-Toileting: Bed level Toilet Transfer: Moderate assistance Toilet Transfer Method: Sit pivot Toilet Transfer Equipment:  Bedside commode Tub/Shower Transfer: Not assessed Social research officer, government: Moderate assistance Social research officer, government Method: Radiographer, therapeutic: Grab bars;Transfer tub bench Mobility  Bed Mobility Bed Mobility: Rolling Right;Rolling Left;Supine to Sit;Sit to Supine Rolling Right: Moderate Assistance - Patient 50-74%;Minimal Assistance - Patient > 75% Rolling Left: Minimal Assistance - Patient > 75%;Moderate Assistance - Patient 50-74% Supine to Sit: Moderate Assistance - Patient 50-74% Sit to Supine: Minimal Assistance - Patient > 75% Transfers Sit to Stand: Minimal Assistance - Patient > 75%;Moderate Assistance - Patient 50-74% Stand to Sit: Moderate Assistance - Patient 50-74%   Discharge Criteria: Patient will be discharged from OT if patient refuses treatment 3 consecutive times without medical reason, if treatment goals not met, if there is a change in medical status, if patient makes no progress towards goals or if patient is discharged from hospital.  The above assessment, treatment plan, treatment alternatives and goals were discussed and mutually agreed upon: by patient  Janey Genta 09/01/2022, 6:31 PM

## 2022-09-01 NOTE — Plan of Care (Signed)
  Problem: RH Balance Goal: LTG Patient will maintain dynamic sitting balance (PT) Description: LTG:  Patient will maintain dynamic sitting balance with assistance during mobility activities (PT) Flowsheets (Taken 09/01/2022 1531) LTG: Pt will maintain dynamic sitting balance during mobility activities with:: Supervision/Verbal cueing Goal: LTG Patient will maintain dynamic standing balance (PT) Description: LTG:  Patient will maintain dynamic standing balance with assistance during mobility activities (PT) Flowsheets (Taken 09/01/2022 1531) LTG: Pt will maintain dynamic standing balance during mobility activities with:: Contact Guard/Touching assist   Problem: Sit to Stand Goal: LTG:  Patient will perform sit to stand with assistance level (PT) Description: LTG:  Patient will perform sit to stand with assistance level (PT) Flowsheets (Taken 09/01/2022 1531) LTG: PT will perform sit to stand in preparation for functional mobility with assistance level: Supervision/Verbal cueing   Problem: RH Bed Mobility Goal: LTG Patient will perform bed mobility with assist (PT) Description: LTG: Patient will perform bed mobility with assistance, with/without cues (PT). Flowsheets (Taken 09/01/2022 1531) LTG: Pt will perform bed mobility with assistance level of: Supervision/Verbal cueing   Problem: RH Bed to Chair Transfers Goal: LTG Patient will perform bed/chair transfers w/assist (PT) Description: LTG: Patient will perform bed to chair transfers with assistance (PT). Flowsheets (Taken 09/01/2022 1531) LTG: Pt will perform Bed to Chair Transfers with assistance level: Supervision/Verbal cueing   Problem: RH Car Transfers Goal: LTG Patient will perform car transfers with assist (PT) Description: LTG: Patient will perform car transfers with assistance (PT). Flowsheets (Taken 09/01/2022 1531) LTG: Pt will perform car transfers with assist:: Supervision/Verbal cueing   Problem: RH Ambulation Goal: LTG Patient  will ambulate in controlled environment (PT) Description: LTG: Patient will ambulate in a controlled environment, # of feet with assistance (PT). Flowsheets (Taken 09/01/2022 1531) LTG: Pt will ambulate in controlled environ  assist needed:: Supervision/Verbal cueing LTG: Ambulation distance in controlled environment: 130f using LRAD Goal: LTG Patient will ambulate in home environment (PT) Description: LTG: Patient will ambulate in home environment, # of feet with assistance (PT). Flowsheets (Taken 09/01/2022 1531) LTG: Pt will ambulate in home environ  assist needed:: Supervision/Verbal cueing LTG: Ambulation distance in home environment: 571fusing LRAD

## 2022-09-01 NOTE — Progress Notes (Signed)
Patient ID: Melvin Taylor, male   DOB: 05-25-1966, 57 y.o.   MRN: [id number] Met with the patient to review current situation, rehab process, team conference and plan of care. Discussed medication management for secondary risks including HTN, HLD, DM and previous stroke on Brilinta. Reviewed dietary modifications for D2/Nectar diet and skin care. Continue to follow along to address educational needs to facilitate preparation for discharge. Margarito Liner

## 2022-09-01 NOTE — Plan of Care (Signed)
  Problem: RH Cognition - SLP Goal: RH LTG Patient will demonstrate orientation with cues Description:  LTG:  Patient will demonstrate orientation to person/place/time/situation with cues (SLP)   Flowsheets (Taken 09/01/2022 1302) LTG Patient will demonstrate orientation to:  Person  Place  Time  Situation LTG: Patient will demonstrate orientation using cueing (SLP): Minimal Assistance - Patient > 75%   Problem: RH Comprehension Communication Goal: LTG Patient will comprehend basic/complex auditory (SLP) Description: LTG: Patient will comprehend basic/complex auditory information with cues (SLP). Flowsheets (Taken 09/01/2022 1302) LTG: Patient will comprehend: Basic auditory information LTG: Patient will comprehend auditory information with cueing (SLP): Minimal Assistance - Patient > 75%   Problem: RH Expression Communication Goal: LTG Patient will express needs/wants via multi-modal(SLP) Description: LTG:  Patient will express needs/wants via multi-modal communication (gestures/written, etc) with cues (SLP) Flowsheets (Taken 09/01/2022 1302) LTG: Patient will express needs/wants via multimodal communication (gestures/written, etc) with cueing (SLP): Supervision Goal: LTG Patient will verbally express basic/complex needs(SLP) Description: LTG:  Patient will verbally express basic/complex needs, wants or ideas with cues  (SLP) Flowsheets (Taken 09/01/2022 1302) LTG: Patient will verbally express basic/complex needs, wants or ideas (SLP): Minimal Assistance - Patient > 75%

## 2022-09-01 NOTE — Progress Notes (Signed)
PROGRESS NOTE   Subjective/Complaints:    Objective:   DG Swallowing Func-Speech Pathology  Result Date: 08/30/2022 Table formatting from the original result was not included. Objective Swallowing Evaluation: Type of Study: MBS-Modified Barium Swallow Study  Patient Details Name: Melvin Taylor MRN: [id number] Date of Birth: 01/04/1966 Today's Date: 08/30/2022 Time: SLP Start Time (ACUTE ONLY): 6712 -SLP Stop Time (ACUTE ONLY): 4580 SLP Time Calculation (min) (ACUTE ONLY): 20 min Past Medical History: Past Medical History: Diagnosis Date  Bipolar 1 disorder ([place])   depression/anxiety  Bone spur   left heel  Cervical radiculopathy   Chronic back pain   Chronic chest wall pain   Chronic neck pain   Coronary artery disease   a. s/p CABG in 03/2016 with LIMA-LAD, SVG-D1, SVG-RCA, and Seq SVG-mid and distal OM  Diabetes mellitus   Type II  Hallucinations   "long history of them"  Headache(784.0)   History of gout   HTN (hypertension)   Insomnia   Neuropathy   Pain management   Polysubstance abuse ([place])   Right leg pain   chronic  Schizophrenia ([place])   Seizures ([place])   last sz between July 5-9th, 2016; epilepsy  Sleep apnea   Stroke ([address])   Transfusion of blood product refused for religious reason  Past Surgical History: Past Surgical History: Procedure Laterality Date  ANTERIOR CERVICAL DECOMP/DISCECTOMY FUSION N/A 12/14/2015  Procedure: ANTERIOR CERVICAL DECOMPRESSION/DISCECTOMY FUSION CERVICAL FIVE -SIX;  Surgeon: Earnie Larsson, MD;  Location: [facility];  Service: Neurosurgery;  Laterality: N/A;  BIOPSY  05/07/2015  Procedure: BIOPSY (Gastric);  Surgeon: [name], MD;  Location: [facility];  Service: Endoscopy;;  CARDIAC CATHETERIZATION N/A 03/07/2016  Procedure: Left Heart Cath and Coronary Angiography;  Surgeon: Belva Crome, MD;  Location: [facility];  Service: Cardiovascular;  Laterality: N/A;  COLONOSCOPY WITH PROPOFOL N/A 05/07/2015   [id number] diverticulosis, multiple colon polyps removed, tubular adenoma, serrated colon polyp. Next colonoscopy October 2019  COLONOSCOPY WITH PROPOFOL N/A 08/02/2018  Procedure: COLONOSCOPY WITH PROPOFOL;  Surgeon: [name], MD;  Location: [facility];  Service: Endoscopy;  Laterality: N/A;  12:00pm  CORONARY ARTERY BYPASS GRAFT N/A 03/28/2016  Procedure: CORONARY ARTERY BYPASS GRAFTING (CABG) x 5 USING GREATER SAPHENOUS VEIN;  Surgeon: [name], MD;  Location: [facility];  Service: Open Heart Surgery;  Laterality: N/A;  ENDOVEIN HARVEST OF GREATER SAPHENOUS VEIN Right 03/28/2016  Procedure: ENDOVEIN HARVEST OF GREATER SAPHENOUS VEIN;  Surgeon: [name], MD;  Location: [facility];  Service: Open Heart Surgery;  Laterality: Right;  ESOPHAGEAL DILATION N/A 05/07/2015  Procedure: ESOPHAGEAL DILATION WITH 56FR MALONEY DILATOR;  Surgeon: [name], MD;  Location: [facility];  Service: Endoscopy;  Laterality: N/A;  ESOPHAGOGASTRODUODENOSCOPY (EGD) WITH PROPOFOL N/A 05/07/2015  RMR: Status post dilation of normal esophagus. Gastritis.  IR CT HEAD LTD  12/17/2020  IR CT HEAD LTD  12/17/2020  IR INTRA CRAN STENT  12/17/2020  IR PERCUTANEOUS ART THROMBECTOMY/INFUSION INTRACRANIAL INC DIAG ANGIO  12/17/2020  IR RADIOLOGIST EVAL & MGMT  03/25/2021  KNEE SURGERY Left   arthroscopy  MANDIBLE FRACTURE SURGERY    POLYPECTOMY  05/07/2015  Procedure:  POLYPECTOMY (Hepatic Flexure, Distal Transverse Colon, Rectal);  Surgeon: [name], MD;  Location: [facility];  Service: Endoscopy;;  RADIOLOGY WITH ANESTHESIA N/A 12/16/2020  Procedure: IR WITH ANESTHESIA;  Surgeon: Luanne Bras, MD;  Location: [facility];  Service: Radiology;  Laterality: N/A;  TEE WITHOUT CARDIOVERSION N/A 03/28/2016  Procedure: TRANSESOPHAGEAL ECHOCARDIOGRAM (TEE);  Surgeon: [name], MD;  Location: [facility];  Service: Open Heart Surgery;  Laterality: N/A;  TONSILLECTOMY    TRIGGER FINGER RELEASE Left 10/15/2021  Procedure: RELEASE TRIGGER FINGER/A-1 PULLEY  left middle or long finger;  Surgeon: Carole Civil, MD;  Location: [facility];  Service: Orthopedics;  Laterality: Left; HPI: 57 y.o. male with medical history significant of bipolar d/o vs/ schizophrenia, chronic chest wall/neck/back pain, CAD s/p CABG, DM, HTN, polysubstance abuse, OSA, and seizure d/o presenting with AMS.  He was [facility] to [facility] from [facility]/20[facility] with AMS, minimally improving during hospitalization.  He was noted to have a persistent punctate focus of restricted diffusion in the L medial temporal lobe, concerning for acute/subacute CVA.  MRI/CT with SDH that developed during the hospitalization, 4 mm in L occipital region.  He has been on IV Keppra throughout the hospitalization without apparent seizure activity.  He is able to answer very limited questions, none about orientation.  He does follow some commands  Subjective: Alert, cooperative  Recommendations for follow up therapy are one component of a multi-disciplinary discharge planning process, led by the attending physician.  Recommendations may be updated based on patient status, additional functional criteria and insurance authorization. Assessment / Plan / Recommendation   08/30/2022   5:48 PM Clinical Impressions Clinical Impression Patient presents with a mild oral and a mild-moderate pharyngeal phase dysphagia as per this MBS. During oral phase, patient exhibited weak lingual manipulation and mastication leading to delays in bolus formation and transit. During pharyngeal phase, patient with vallecular residuals with all consistencies as well as pyriform sinus residuals with honey thick, nectar thick and thin liquids. Residuals started to clear with subsequent swallows. No penetration or aspiration observed with teaspoon sips of nectar, thin or honey thick liquids. During consecutive cup sips with thin liquids and nectar thick liquids, patient with reduced epiglottic inversion leading to penetration above vocal cords. In addition, airway  closure was adequate with nectar thick liquids even during concecutive sips. Aspiration observed during the swallow with consecutive sips of thin liquids and although it was trace to mild in amount, patient did not sense. (silent aspiration). SLP recommending to continue with Dys 2 solids, nectar thick liquids at this time and will trial thin liquids at bedside. SLP Visit Diagnosis Dysphagia, oropharyngeal phase (R13.12) Impact on safety and function Mild aspiration risk;Moderate aspiration risk     08/30/2022   5:48 PM Treatment Recommendations Treatment Recommendations Therapy as outlined in treatment plan below     08/30/2022   5:55 PM Prognosis Prognosis for Safe Diet Advancement Good Barriers to Reach Goals Severity of deficits;Time post onset   08/30/2022   5:48 PM Diet Recommendations SLP Diet Recommendations Dysphagia 2 (Fine chop) solids;Nectar thick liquid Liquid Administration via Cup;Straw Medication Administration Whole meds with puree Compensations Slow rate;Small sips/bites;Minimize environmental distractions;Lingual sweep for clearance of pocketing Postural Changes Seated upright at 90 degrees     08/30/2022   5:48 PM Other Recommendations Oral Care Recommendations Oral care BID Other Recommendations Order thickener from pharmacy Follow Up Recommendations Acute inpatient rehab (3hours/day) Functional Status Assessment Patient has had a recent decline in their functional status and  demonstrates the ability to make significant improvements in function in a reasonable and predictable amount of time.   08/30/2022   5:48 PM Frequency and Duration  Speech Therapy Frequency (ACUTE ONLY) min 2x/week Treatment Duration 2 weeks     08/30/2022   5:43 PM Oral Phase Oral Phase Impaired Oral - Honey Teaspoon Reduced posterior propulsion;Weak lingual manipulation Oral - Nectar Teaspoon Weak lingual manipulation;Reduced posterior propulsion Oral - Nectar Cup Weak lingual manipulation;Reduced posterior propulsion Oral -  Thin Teaspoon Weak lingual manipulation;Holding of bolus Oral - Thin Cup Premature spillage Oral - Puree Reduced posterior propulsion;Weak lingual manipulation Oral - Mech Soft Reduced posterior propulsion;Weak lingual manipulation;Piecemeal swallowing;Impaired mastication    08/30/2022   5:45 PM Pharyngeal Phase Pharyngeal Phase Impaired Pharyngeal- Honey Teaspoon Pharyngeal residue - valleculae;Pharyngeal residue - pyriform Pharyngeal- Nectar Teaspoon Pharyngeal residue - valleculae;Pharyngeal residue - pyriform Pharyngeal- Nectar Cup Delayed swallow initiation-pyriform sinuses;Reduced airway/laryngeal closure;Reduced epiglottic inversion;Penetration/Aspiration during swallow;Pharyngeal residue - valleculae;Pharyngeal residue - pyriform Pharyngeal Material enters airway, remains ABOVE vocal cords then ejected out;Material enters airway, remains ABOVE vocal cords and not ejected out Pharyngeal- Thin Teaspoon Pharyngeal residue - valleculae;Pharyngeal residue - pyriform Pharyngeal- Thin Cup Pharyngeal residue - valleculae;Pharyngeal residue - pyriform;Penetration/Aspiration during swallow;Reduced epiglottic inversion;Reduced airway/laryngeal closure Pharyngeal Material enters airway, passes BELOW cords without attempt by patient to eject out (silent aspiration) Pharyngeal- Puree Reduced pharyngeal peristalsis;Reduced tongue base retraction;Pharyngeal residue - valleculae;Pharyngeal residue - pyriform    08/30/2022   5:48 PM Cervical Esophageal Phase  Cervical Esophageal Phase Impaired Cervical Esophageal Comment barium retention above UES Sonia Baller, MA, CCC-SLP Speech Therapy                     Recent Labs    09/01/22 [phone]  WBC 5.9  HGB 10.1*  HCT 28.5*  PLT 175   Recent Labs    09/01/22 0613  NA 133*  K 4.6  CL 104  CO2 22  GLUCOSE 62*  BUN 12  CREATININE 1.03  CALCIUM 7.9*    Intake/Output Summary (Last 24 hours) at 09/01/2022 0916 Last data filed at 09/01/2022 0836 Gross per 24 hour   Intake 535 ml  Output --  Net 535 ml        Physical Exam: Vital Signs Blood pressure 130/64, pulse (!) 59, temperature 98.8 F (37.1 C), resp. rate 18, height '5\' 5"'$  (1.651 m), weight 53.2 kg, SpO2 100 %.   General: No acute distress Mood and affect are appropriate Heart: Regular rate and rhythm no rubs murmurs or extra sounds Lungs: Clear to auscultation, breathing unlabored, no rales or wheezes Abdomen: Positive bowel sounds, soft nontender to palpation, nondistended Extremities: No clubbing, cyanosis, or edema Skin: No evidence of breakdown, no evidence of rash Neurologic: Cranial nerves II through XII intact, motor strength is 4/5 in bilateral , bicep, tricep, grip, hip flexor, knee extensors, ankle dorsiflexor and plantar flexor, left delt RIght deltoid MMT limited by pain    Musculoskeletal:RIght shoulder pain limits abd and flexion    Assessment/Plan: 1. Functional deficits which require 3+ hours per day of interdisciplinary therapy in a comprehensive inpatient rehab setting. Physiatrist is providing close team supervision and 24 hour management of active medical problems listed below. Physiatrist and rehab team continue to assess barriers to discharge/monitor patient progress toward functional and medical goals  Care Tool:  Bathing              Bathing assist       Upper Body Dressing/Undressing Upper  body dressing        Upper body assist      Lower Body Dressing/Undressing Lower body dressing            Lower body assist       Toileting Toileting    Toileting assist       Transfers Chair/bed transfer  Transfers assist           Locomotion Ambulation   Ambulation assist              Walk 10 feet activity   Assist           Walk 50 feet activity   Assist           Walk 150 feet activity   Assist           Walk 10 feet on uneven surface  activity   Assist            Wheelchair     Assist               Wheelchair 50 feet with 2 turns activity    Assist            Wheelchair 150 feet activity     Assist          Blood pressure 130/64, pulse (!) 59, temperature 98.8 F (37.1 C), resp. rate 18, height '5\' 5"'$  (1.651 m), weight 53.2 kg, SpO2 100 %.  Medical Problem List and Plan: 1. Functional deficits secondary to acute metabolic encephalopathy.  Stable trace subdural hematoma along the posterior falx and posterior occipital lobe.  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  Received inpatient rehab services 12/21/2020 - 12/30/2020 after CVA             -patient may 16-18 days shower             -ELOS/Goals: PT/OT/SLP Supervision to MIN A             -Admit to CIR, evaluations tomorrow by PT OT and SLP 2.  Antithrombotics: -DVT/anticoagulation:  Mechanical:  Antiembolism stockings, knee (TED hose) Bilateral lower extremities             -antiplatelet therapy: Brilinta 90 mg twice daily 3. Pain Management: Tylenol as needed             RIgh tshoujlder pain- may have fallen at home PTA , check Xray  4. Mood/Behavior/Sleep: Provide emotional support.  Valium 5 mg daily Lexapro 20 mg daily, Depakote 500 mg twice daily             -antipsychotic agents: N/A 5. Neuropsych/cognition: This patient is not capable of making decisions on his own behalf. 6. Skin/Wound Care: Routine skin checks 7. Fluids/Electrolytes/Nutrition: Routine in and outs with follow-up chemistries 8.  Seizure disorder/epilepsy.  EEG negative.  Keppra 1000 mg every 12 hours, Depakote 500 mg twice a day 9.  Diabetes mellitus.  Currently SSI.  Patient on Lantus insulin 40 units nightly and Amaryl 2 mg daily prior to admission.  Resume as needed.  -CBGs have been labile, if continues consider decreasing SSI scale 10.  Hyperlipidemia.  Lipitor '80mg'$  daily 11.  CAD with CABG 2017.  Continue  Imdur 30 mg daily.  No chest pain or shortness of breath. 12.  History of hypertension.  Patient on Norvasc 10 mg daily, Lopressor 12.5 mg twice daily, lisinopril 5 mg daily, BP well  controlled overall 13.  COVID-19.  Resolved.  Precautions have been discontinued    LOS: 1 days A FACE TO [place] Renda Pohlman 09/01/2022, 9:16 AM

## 2022-09-01 NOTE — Progress Notes (Signed)
Inpatient Rehabilitation Admission Medication Review by a Pharmacist  A complete drug regimen review was completed for this patient to identify any potential clinically significant medication issues.  High Risk Drug Classes Is patient taking? Indication by Medication  Antipsychotic No   Anticoagulant No   Antibiotic No   Opioid No   Antiplatelet Yes Brilinta - CAD  Hypoglycemics/insulin Yes SSI - DM (Amaryl PTA)  Vasoactive Medication Yes Imdur, amlodipine, lisinopril, metoprolol - CAD, BP  Chemotherapy No   Other Yes Albuterol - SOB Allopurinol - gout Atorvastatin - HLD Diazepam - anxiety Lexapro - mood  Keppra, Depakote - seizure history     Type of Medication Issue Identified Description of Issue Recommendation(s)  Drug Interaction(s) (clinically significant)     Duplicate Therapy     Allergy     No Medication Administration End Date     Incorrect Dose     Additional Drug Therapy Needed     Significant med changes from prior encounter (inform family/care partners about these prior to discharge).    Other       Clinically significant medication issues were identified that warrant physician communication and completion of prescribed/recommended actions by midnight of the next day:  No  Pharmacist comments: None  Time spent performing this drug regimen review (minutes):  30 minutes  Thank you Anette Guarneri, PharmD

## 2022-09-01 NOTE — Evaluation (Signed)
Physical Therapy Assessment and Plan  Patient Details  Name: MAXUM CASSARINO MRN: [id number] Date of Birth: 08-22-1965  PT Diagnosis: Abnormal posture, Abnormality of gait, Cognitive deficits, Contracture of joint: R elbow (potentially developing), Difficulty walking, Hemiparesis dominant, Hypertonia, Impaired cognition, Impaired sensation, Muscle weakness, and Pain in R LE when tone onset happens Rehab Potential: Good ELOS: ~ 2 weeks   Today's Date: 09/01/2022 PT Individual Time: 6295-2841 PT Individual Time Calculation (min): 65 min    Hospital Problem: Principal Problem:   Acute metabolic encephalopathy Active Problems:   Primary hypertension   Hyperlipidemia   Past Medical History:  Past Medical History:  Diagnosis Date   Bipolar 1 disorder ([place])    depression/anxiety   Bone spur    left heel   Cervical radiculopathy    Chronic back pain    Chronic chest wall pain    Chronic neck pain    Coronary artery disease    a. s/p CABG in 03/2016 with LIMA-LAD, SVG-D1, SVG-RCA, and Seq SVG-mid and distal OM   Diabetes mellitus    Type II   Hallucinations    "long history of them"   Headache(784.0)    History of gout    HTN (hypertension)    Insomnia    Neuropathy    Pain management    Polysubstance abuse ([place])    Right leg pain    chronic   Schizophrenia ([place])    Seizures ([place])    last sz between July 5-9th, 2016; epilepsy   Sleep apnea    Stroke ([place])    Transfusion of blood product refused for religious reason    Past Surgical History:  Past Surgical History:  Procedure Laterality Date   ANTERIOR CERVICAL DECOMP/DISCECTOMY FUSION N/A 12/14/2015   Procedure: ANTERIOR CERVICAL DECOMPRESSION/DISCECTOMY FUSION CERVICAL FIVE -SIX;  Surgeon: Earnie Larsson, MD;  Location: [facility];  Service: Neurosurgery;  Laterality: N/A;   BIOPSY  05/07/2015   Procedure: BIOPSY (Gastric);  Surgeon: [name], MD;  Location: [facility];  Service: Endoscopy;;   CARDIAC CATHETERIZATION  N/A 03/07/2016   Procedure: Left Heart Cath and Coronary Angiography;  Surgeon: Belva Crome, MD;  Location: [facility];  Service: Cardiovascular;  Laterality: N/A;   COLONOSCOPY WITH PROPOFOL N/A 05/07/2015   LKG:[id number] diverticulosis, multiple colon polyps removed, tubular adenoma, serrated colon polyp. Next colonoscopy October 2019   COLONOSCOPY WITH PROPOFOL N/A 08/02/2018   Procedure: COLONOSCOPY WITH PROPOFOL;  Surgeon: [name], MD;  Location: [facility];  Service: Endoscopy;  Laterality: N/A;  12:00pm   CORONARY ARTERY BYPASS GRAFT N/A 03/28/2016   Procedure: CORONARY ARTERY BYPASS GRAFTING (CABG) x 5 USING GREATER SAPHENOUS VEIN;  Surgeon: [name], MD;  Location: [facility];  Service: Open Heart Surgery;  Laterality: N/A;   ENDOVEIN HARVEST OF GREATER SAPHENOUS VEIN Right 03/28/2016   Procedure: ENDOVEIN HARVEST OF GREATER SAPHENOUS VEIN;  Surgeon: [name], MD;  Location: [facility];  Service: Open Heart Surgery;  Laterality: Right;   ESOPHAGEAL DILATION N/A 05/07/2015   Procedure: ESOPHAGEAL DILATION WITH 56FR MALONEY DILATOR;  Surgeon: [name], MD;  Location: [facility];  Service: Endoscopy;  Laterality: N/A;   ESOPHAGOGASTRODUODENOSCOPY (EGD) WITH PROPOFOL N/A 05/07/2015   RMR: Status post dilation of normal esophagus. Gastritis.   IR CT HEAD LTD  12/17/2020   IR CT HEAD LTD  12/17/2020   IR INTRA CRAN STENT  12/17/2020   IR PERCUTANEOUS ART THROMBECTOMY/INFUSION INTRACRANIAL INC DIAG ANGIO  12/17/2020   IR RADIOLOGIST EVAL & MGMT  03/25/2021   KNEE SURGERY Left    arthroscopy   MANDIBLE FRACTURE SURGERY     POLYPECTOMY  05/07/2015   Procedure: POLYPECTOMY (Hepatic Flexure, Distal Transverse Colon, Rectal);  Surgeon: [name], MD;  Location: [facility];  Service: Endoscopy;;   RADIOLOGY WITH ANESTHESIA N/A 12/16/2020   Procedure: IR WITH ANESTHESIA;  Surgeon: Luanne Bras, MD;  Location: [facility];  Service: Radiology;  Laterality: N/A;   TEE WITHOUT  CARDIOVERSION N/A 03/28/2016   Procedure: TRANSESOPHAGEAL ECHOCARDIOGRAM (TEE);  Surgeon: [name], MD;  Location: [facility];  Service: Open Heart Surgery;  Laterality: N/A;   TONSILLECTOMY     TRIGGER FINGER RELEASE Left 10/15/2021   Procedure: RELEASE TRIGGER FINGER/A-1 PULLEY left middle or long finger;  Surgeon: Carole Civil, MD;  Location: [facility];  Service: Orthopedics;  Laterality: Left;    Assessment & Plan Clinical Impression: Patient is a 57 y.o. year old right-handed male with history of bipolar disorder/schizophrenia, CAD/CABG, diabetes mellitus, hypertension, polysubstance abuse, OSA and will not use CPAP and seizure disorder//epilepsy maintained on Depakote as well as Lamictal as well as left MCA infarction with stenting maintained on Brilinta as well as Plavix receiving inpatient rehab services 12/21/2020 - 12/30/2020.  He was discharged to home ambulating 150 feet without assistive device contact-guard.  Per chart review patient lives alone.  1 level apartment with elevator.  Has a home health aide 53 hours a week to assist with making meals dressing bathing managing medications.  Sister does finances and his POA.  Presented 08/27/2022 from outside hospital after admission 1/20-27 with altered mental status.  He was noted to have persistent punctate focus of restricted diffusion in the left temporal medial lobe concerning for acute/subacute CVA on imaging at outside hospital.  MRI showed trace SDH that developed during the hospitalization 4 mm in the left occipital region.  He was transferred to [facility].  Cranial CT scan showed stable trace subdural hematoma along the posterior falx and posteriorly occipital lobe.  Stable remote infarcts of the left basal ganglia, anterior left frontal lobe and corona radiata but no acute CVA noted.  Stable left MCA stent.  Admission chemistries unremarkable except potassium 3.2, BUN 39, hemoglobin 9.2, valproic acid level 66.  Neurology  follow-up patient currently remains on Brilinta as prior to admission but Plavix on hold.  Neurology felt that  risk of stent restenosis if there is prolonged cessation of Brilinta would most likely outweigh the risk of hemorrhage.  Neurosurgery signed off due to small size and stable appearance of subdural hematoma.  EEG negative for seizure currently remains on Keppra which had been started at outside hospital.  His Depakote and Lamictal prior to admission are currently on hold.  Echocardiogram ejection fraction of 60 to 65% no wall motion abnormalities grade 1 diastolic dysfunction.  Hospital course COVID questionably incidental finding patient asymptomatic and completed antiviral/steroid course and maintained on airborne contact precautions.  He has completed his quarantine today.  Currently on dysphagia #2 nectar thick liquid diet.  Patient reports pain in his legs however he is unable to provide many details regarding this issue.  He does admit to history of diabetes with polyneuropathy.  Therapy evaluations completed due to patient decreased functional mobility altered mental status/dysphagia was admitted for a comprehensive rehab program. Patient transferred to CIR on 08/31/2022 .   Patient currently requires mod assist with mobility secondary to muscle weakness and muscle joint  tightness, decreased cardiorespiratoy endurance, impaired timing and sequencing, abnormal tone, and unbalanced muscle activation,  , decreased initiation, decreased attention, decreased awareness, decreased problem solving, decreased safety awareness, decreased memory, and delayed processing, and decreased sitting balance, decreased standing balance, decreased postural control, hemiplegia, and decreased balance strategies.  Prior to hospitalization, patient was independent  with mobility and lived with Alone (all information obtained from chart review as pt with limited verbal responses to therapist throughout session) in a  [place] home.  Home access is  Elevator.  Patient will benefit from skilled PT intervention to maximize safe functional mobility, minimize fall risk, and decrease caregiver burden for planned discharge home with 24 hour supervision.  Anticipate patient will benefit from follow up [place] at discharge.  PT - End of Session Activity Tolerance: Tolerates 30+ min activity with multiple rests Endurance Deficit: Yes Endurance Deficit Description: pt with impaired endurance but also has decreased engagement PT Assessment Rehab Potential (ACUTE/IP ONLY): Good PT Barriers to Discharge: Behavior;Lack of/limited family support;Decreased caregiver support;Nutrition means PT Patient demonstrates impairments in the following area(s): Balance;Perception;Behavior;Safety;Edema;Sensory;Endurance;Skin Integrity;Motor;Nutrition;Pain PT Transfers Functional Problem(s): Bed Mobility;Bed to Chair;Car;Furniture;Floor PT Locomotion Functional Problem(s): Ambulation;Stairs PT Plan PT Intensity: Minimum of 1-2 x/day ,45 to 90 minutes PT Frequency: 5 out of 7 days PT Duration Estimated Length of Stay: ~ 2 weeks PT Treatment/Interventions: Ambulation/gait training;Community reintegration;DME/adaptive equipment instruction;Neuromuscular re-education;Psychosocial support;Stair training;UE/LE Strength taining/ROM;Balance/vestibular training;Functional electrical stimulation;Discharge planning;Pain management;Skin care/wound management;UE/LE Coordination activities;Therapeutic Activities;Cognitive remediation/compensation;Disease management/prevention;Functional mobility training;Patient/family education;Splinting/orthotics;Therapeutic Exercise;Visual/perceptual remediation/compensation PT Transfers Anticipated Outcome(s): supervision using LRAD PT Locomotion Anticipated Outcome(s): supervision using LRAD PT Recommendation Follow Up Recommendations: Home health PT;24 hour supervision/assistance Patient destination:  Home Equipment Recommended: To be determined   PT Evaluation Precautions/Restrictions Precautions Precautions: Fall;Other (comment) Precaution Comments: R hemiplegia with R UE flexor tone and R LE extensor tone (LE tone primarily in the bed), Aphasia, delayed processing Required Braces or Orthoses: Other Brace Other Brace: R UE aircast for tone management Restrictions Weight Bearing Restrictions: No Pain Pain Assessment Pain Scale: Faces Faces Pain Scale: Hurts even more (when performing R LE hip/knee flexion) Pain Type: Other (Comment) (pain only with this movement when supine in bed) Pain Location: Leg Pain Orientation: Right Pain Descriptors / Indicators: Spasm;Other (Comment) (tone response) Pain Onset: Other (Comment) (only with this movement but calmed at rest in hooklying position) Pain Intervention(s): Rest;Repositioned Pain Interference Pain Interference Pain Effect on Sleep: 8. Unable to answer (pt with limited to no verbal responses throughout session) Pain Interference with Therapy Activities: 8. Unable to answer Pain Interference with Day-to-Day Activities: 8. Unable to answer Home Living/Prior Functioning Home Living Living Arrangements: Alone Available Help at Discharge: Family;Available 24 hours/day Type of Home: Apartment Home Access: Elevator Home Layout: Other (Comment) (2nd floor apartment) Bathroom Shower/Tub: Chiropodist: Standard Bathroom Accessibility: Yes Additional Comments: per SLP convo with his sister and chart review pt was independent for basic functional mobility at household level using cane only for community mobility  Lives With: Alone (all information obtained from chart review as pt with limited verbal responses to therapist throughout session) Prior Function Level of Independence: Independent with gait;Independent with transfers Meal Prep: Total Laundry: Total Vacuuming: Total Light Housekeeping: Total Gardening:  Total Yard Work: Total Shopping: Total  Able to Take Stairs?: Yes (pt reports he was able to do stairs but unsure of accuracy of that information) Driving: No 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 Praxis Praxis: Impaired Praxis Impairment Details: Initiation Praxis-Other Comments: pt with delayed  inititaion throughout session  Cognition Overall Cognitive Status: History of cognitive impairments - at baseline (pt with very limited verbal responses to therapist throughout session as pt takes a while to warm up to people and did not want to engage) Arousal/Alertness: Awake/alert Orientation Level: Oriented to person Attention: Focused;Sustained Focused Attention: Appears intact Sustained Attention: Impaired Memory: Impaired Awareness: Impaired Behaviors: Poor frustration tolerance Safety/Judgment: Impaired Sensation Sensation Light Touch: Impaired by gross assessment (unable to formally assess due to decreased pt engagement but has hx of polyneuropathy per chart) Hot/Cold: Not tested Proprioception: Appears Intact (unable to formally assess due to decreased pt engagement but grossly did not notice a deficit in this) Stereognosis: Not tested Coordination Gross Motor Movements are Fluid and Coordinated: No Coordination and Movement Description: impaired due to R hemiplegia with abnormal tone and imbalance balance Motor  Motor Motor: Abnormal tone;Abnormal postural alignment and control;Hemiplegia Motor - Skilled Clinical Observations: R hemiplegia, R UE flexor tone, R LE extensor tone primarily in supine   Trunk/Postural Assessment  Cervical Assessment Cervical Assessment: Exceptions to [facility] (forward head) Thoracic Assessment Thoracic Assessment: Exceptions to [facility] (rounded shoulders) Lumbar Assessment Lumbar Assessment: Exceptions to [facility] (posterior pelvic tilt in sitting) Postural Control Postural Control: Deficits on  evaluation Righting Reactions: delayed and inadequate for minor or more perturbations Postural Limitations: decreased  Balance Balance Balance Assessed: Yes Static Sitting Balance Static Sitting - Balance Support: Feet supported;Left upper extremity supported Static Sitting - Level of Assistance: Other (comment) (CGA) Dynamic Sitting Balance Dynamic Sitting - Balance Support: Feet supported Dynamic Sitting - Level of Assistance: 3: Mod assist Static Standing Balance Static Standing - Balance Support: During functional activity;Left upper extremity supported Static Standing - Level of Assistance: 4: Min assist;Other (comment) (CGA) Dynamic Standing Balance Dynamic Standing - Balance Support: During functional activity;Bilateral upper extremity supported Dynamic Standing - Level of Assistance: 3: Mod assist;4: Min assist Extremity Assessment      RLE Assessment RLE Assessment: Exceptions to [facility] Active Range of Motion (AROM) Comments: WFL for tasks assessed General Strength Comments: did not participate in formal assessment due to decreased pt engagement limiting his ability to follow commands - functionally, demos at least 3+/5 or greater RLE Strength Right Hip Flexion: 3+/5 Right Knee Flexion: 3+/5 Right Knee Extension: 3+/5 Right Ankle Dorsiflexion: 3+/5 Right Ankle Plantar Flexion: 3+/5 RLE Tone RLE Tone Comments: significant extensor tone in R LE noted supine in bed, but otherwise did not notice tone impacting movements LLE Assessment LLE Assessment: Exceptions to [facility] Active Range of Motion (AROM) Comments: WFL for tasks assessed General Strength Comments: did not participate in formal assessment due to decreased pt engagement limiting his ability to follow commands - functionally, demos at least 3+/5 or greater LLE Strength Left Hip Flexion: 3+/5 Left Knee Flexion: 3+/5 Left Knee Extension: 3+/5 Left Ankle Dorsiflexion: 3+/5 Left Ankle Plantar Flexion: 3+/5  Care  Tool Care Tool Bed Mobility Roll left and right activity   Roll left and right assist level: Moderate Assistance - Patient 50 - 74%    Sit to lying activity   Sit to lying assist level: Minimal Assistance - Patient > 75%    Lying to sitting on side of bed activity   Lying to sitting on side of bed assist level: the ability to move from lying on the back to sitting on the side of the bed with no back support.: Moderate Assistance - Patient 50 - 74%     Care Tool Transfers Sit to stand transfer  Sit to stand assist level: Moderate Assistance - Patient 50 - 74%    Chair/bed transfer   Chair/bed transfer assist level: Moderate Assistance - Patient 50 - 74%     Physiological scientist transfer assist level: Minimal Assistance - Patient > 75% Health visitor Comment: UE support on stable car    Care Tool Locomotion Ambulation   Assist level: 2 helpers (+2 BHHA min assist) Assistive device: Hand held assist Max distance: 60f  Walk 10 feet activity   Assist level: 2 helpers Assistive device: Hand held assist   Walk 50 feet with 2 turns activity Walk 50 feet with 2 turns activity did not occur: Safety/medical concerns      Walk 150 feet activity Walk 150 feet activity did not occur: Safety/medical concerns      Walk 10 feet on uneven surfaces activity Walk 10 feet on uneven surfaces activity did not occur: Safety/medical concerns      Stairs Stair activity did not occur: Safety/medical concerns        Walk up/down 1 step activity Walk up/down 1 step or curb (drop down) activity did not occur: Safety/medical concerns      Walk up/down 4 steps activity Walk up/down 4 steps activity did not occur: Safety/medical concerns      Walk up/down 12 steps activity Walk up/down 12 steps activity did not occur: Safety/medical concerns      Pick up small objects from floor   Pick up small object from the floor assist level: Maximal Assistance -  Patient 25 - 49%    Wheelchair Is the patient using a wheelchair?: Yes (for transport)     Wheelchair assist level: Dependent - Patient 0%    Wheel 50 feet with 2 turns activity   Assist Level: Dependent - Patient 0%  Wheel 150 feet activity   Assist Level: Dependent - Patient 0%    Refer to Care Plan for Long Term Goals  SHORT TERM GOAL WEEK 1 PT Short Term Goal 1 (Week 1): Pt will perform supine<>sit with CGA consistently PT Short Term Goal 2 (Week 1): Pt will perform sit<>stands using LRAD with CGA PT Short Term Goal 3 (Week 1): Pt will perform bed<>chair transfers using LRAD with CGA PT Short Term Goal 4 (Week 1): Pt will ambulate at least 568fusing LRAD with CGA  Recommendations for other services: None   Skilled Therapeutic Intervention Pt received supine in bed with a blanket over his head. Pt not appreciative of thearpist waking him and takes a long time to warm up to therapist's presence - >90% of the time he did not verbally respond to therapist when asked questions, but rather would close his eyes and turn his head away from therapist, but when necessary he would initiate moving for mobility tasks when given significantly increased time - pt's mood did improve when Bria, NT was present and this prompted pt to be agreeable to participate in OOB mobility with PT. Pt did warm up to therapist by end of session with increased verbal responses and attempt of ~3 word responses.  Evaluation completed (see details above) with patient education regarding purpose of PT evaluation, PT POC and goals, therapy schedule, weekly team meetings, and other CIR information including safety plan and fall risk safety. Pt performed the below functional mobility tasks with the specified levels of skilled cuing and assistance.  Pt noted to be incontinent of bowels -  notified NT who was present to assist for time management.  Noticed during bed mobility pt has strong R LE extensor tone requiring  significant facilitation to bring R LE into hip/knee flexion for hooklying position and this causes pt to vocalize out in pain. Notified nurse and PA. However, did not notice the tone impacting transfers or gait.   Pt continues to be slow to initiate mobility tasks throughout session.  At end of session, pt left supine in bed with needs in reach and bed alarm on.   Mobility Bed Mobility Bed Mobility: Rolling Right;Rolling Left;Supine to Sit;Sit to Supine Rolling Right: Moderate Assistance - Patient 50-74%;Minimal Assistance - Patient > 75% Rolling Left: Minimal Assistance - Patient > 75%;Moderate Assistance - Patient 50-74% Supine to Sit: Moderate Assistance - Patient 50-74% Sit to Supine: Minimal Assistance - Patient > 75% Transfers Transfers: Sit to Stand;Stand to Sit;Stand Pivot Transfers Sit to Stand: Minimal Assistance - Patient > 75%;Moderate Assistance - Patient 50-74% Stand to Sit: Moderate Assistance - Patient 50-74% (posterior lean) Stand Pivot Transfers: Moderate Assistance - Patient 50 - 74%;Minimal Assistance - Patient > 75% Stand Pivot Transfer Details: Verbal cues for technique;Verbal cues for sequencing;Verbal cues for gait pattern;Verbal cues for precautions/safety;Tactile cues for weight shifting;Tactile cues for sequencing;Tactile cues for posture;Manual facilitation for weight shifting;Tactile cues for initiation Transfer (Assistive device): 1 person hand held assist Locomotion  Gait Ambulation: Yes Gait Assistance: 2 Helpers;Minimal Assistance - Patient > 75% (+ 2 for L HHA and therapist guarding on R side (but able to progress to therapist only with L HHA)) Gait Distance (Feet): 28 Feet (50f x2 with +2 assist progressed to gait in room ~152fusing L HHA from therapist only) Assistive device: 2 person hand held assist;1 person hand held assist Gait Assistance Details: Verbal cues for sequencing;Verbal cues for technique;Verbal cues for safe use of DME/AE;Manual  facilitation for weight shifting;Tactile cues for weight shifting;Tactile cues for sequencing;Tactile cues for initiation;Tactile cues for posture;Verbal cues for gait pattern;Visual cues/gestures for sequencing Gait Gait: Yes Gait Pattern: Impaired Gait Pattern: Decreased step length - right;Decreased step length - left;Poor foot clearance - left;Poor foot clearance - right;Wide base of support;Decreased hip/knee flexion - right;Decreased hip/knee flexion - left (L LE excessive ER) Gait velocity: decreased Stairs / Additional Locomotion Stairs: No Wheelchair Mobility Wheelchair Mobility: No   Discharge Criteria: Patient will be discharged from PT if patient refuses treatment 3 consecutive times without medical reason, if treatment goals not met, if there is a change in medical status, if patient makes no progress towards goals or if patient is discharged from hospital.  The above assessment, treatment plan, treatment alternatives and goals were discussed and mutually agreed upon: by patient  CaTawana Scale PT, DPT, NCS, CSRS 09/01/2022, 12:24 PM

## 2022-09-01 NOTE — Evaluation (Signed)
Speech Language Pathology Assessment and Plan  Patient Details  Name: Melvin Taylor MRN: [id number] Date of Birth: Jul 28, 1966  SLP Diagnosis: Aphasia;Dysphagia;Cognitive Impairments;Speech and Language deficits  Rehab Potential: Fair ELOS: ~2 weeks   Today's Date: 09/01/2022 SLP Individual Time: 4287-6811 SLP Individual Time Calculation (min): 50 min  Hospital Problem: Principal Problem:   Acute metabolic encephalopathy Active Problems:   Primary hypertension   Hyperlipidemia  Past Medical History:  Past Medical History:  Diagnosis Date   Bipolar 1 disorder ([place])    depression/anxiety   Bone spur    left heel   Cervical radiculopathy    Chronic back pain    Chronic chest wall pain    Chronic neck pain    Coronary artery disease    a. s/p CABG in 03/2016 with LIMA-LAD, SVG-D1, SVG-RCA, and Seq SVG-mid and distal OM   Diabetes mellitus    Type II   Hallucinations    "long history of them"   Headache(784.0)    History of gout    HTN (hypertension)    Insomnia    Neuropathy    Pain management    Polysubstance abuse ([place])    Right leg pain    chronic   Schizophrenia ([place])    Seizures ([place])    last sz between July 5-9th, 2016; epilepsy   Sleep apnea    Stroke ([place])    Transfusion of blood product refused for religious reason    Past Surgical History:  Past Surgical History:  Procedure Laterality Date   ANTERIOR CERVICAL DECOMP/DISCECTOMY FUSION N/A 12/14/2015   Procedure: ANTERIOR CERVICAL DECOMPRESSION/DISCECTOMY FUSION CERVICAL FIVE -SIX;  Surgeon: Earnie Larsson, MD;  Location: [facility];  Service: Neurosurgery;  Laterality: N/A;   BIOPSY  05/07/2015   Procedure: BIOPSY (Gastric);  Surgeon: [name], MD;  Location: [facility];  Service: Endoscopy;;   CARDIAC CATHETERIZATION N/A 03/07/2016   Procedure: Left Heart Cath and Coronary Angiography;  Surgeon: Belva Crome, MD;  Location: [facility];  Service: Cardiovascular;  Laterality: N/A;   COLONOSCOPY  WITH PROPOFOL N/A 05/07/2015   XBW:IOMBTDHRCB diverticulosis, multiple colon polyps removed, tubular adenoma, serrated colon polyp. Next colonoscopy October 2019   COLONOSCOPY WITH PROPOFOL N/A 08/02/2018   Procedure: COLONOSCOPY WITH PROPOFOL;  Surgeon: [name], MD;  [facility];  Service: Endoscopy;  Laterality: N/A;  12:00pm   CORONARY ARTERY BYPASS GRAFT N/A 03/28/2016   Procedure: CORONARY ARTERY BYPASS GRAFTING (CABG) x 5 USING GREATER SAPHENOUS VEIN;  Surgeon: [name], MD;  Location: [facility] OR;  Service: Open Heart Surgery;  Laterality: N/A;   ENDOVEIN HARVEST OF GREATER SAPHENOUS VEIN Right 03/28/2016   Procedure: ENDOVEIN HARVEST OF GREATER SAPHENOUS VEIN;  Surgeon: [name], MD;  Location: [facility];  Service: Open Heart Surgery;  Laterality: Right;   ESOPHAGEAL DILATION N/A 05/07/2015   Procedure: ESOPHAGEAL DILATION WITH 56FR MALONEY DILATOR;  Surgeon: [name], MD;  Location: [facility];  Service: Endoscopy;  Laterality: N/A;   ESOPHAGOGASTRODUODENOSCOPY (EGD) WITH PROPOFOL N/A 05/07/2015   RMR: Status post dilation of normal esophagus. Gastritis.   IR CT HEAD LTD  12/17/2020   IR CT HEAD LTD  12/17/2020   IR INTRA CRAN STENT  12/17/2020   IR PERCUTANEOUS ART THROMBECTOMY/INFUSION INTRACRANIAL INC DIAG ANGIO  12/17/2020   IR RADIOLOGIST EVAL & MGMT  03/25/2021   KNEE SURGERY Left    arthroscopy   MANDIBLE FRACTURE SURGERY     POLYPECTOMY  05/07/2015   Procedure: POLYPECTOMY (Hepatic Flexure, Distal Transverse Colon, Rectal);  Surgeon: [name], MD;  [facility];  Service: Endoscopy;;   RADIOLOGY WITH ANESTHESIA N/A 12/16/2020   Procedure: IR WITH ANESTHESIA;  Surgeon: Luanne Bras, MD;  Location: [facility];  Service: Radiology;  Laterality: N/A;   TEE WITHOUT CARDIOVERSION N/A 03/28/2016   Procedure: TRANSESOPHAGEAL ECHOCARDIOGRAM (TEE);  Surgeon: [name], MD;  Location: [facility];  Service: Open Heart Surgery;  Laterality: N/A;   TONSILLECTOMY      TRIGGER FINGER RELEASE Left 10/15/2021   Procedure: RELEASE TRIGGER FINGER/A-1 PULLEY left middle or long finger;  Surgeon: Carole Civil, MD;  Location: [facility];  Service: Orthopedics;  Laterality: Left;    Assessment / Plan / Recommendation Clinical Impression [name] is a 57 year old right-handed male with history of bipolar disorder/schizophrenia, CAD/CABG, diabetes mellitus, hypertension, polysubstance abuse, OSA and will not use CPAP and seizure disorder//epilepsy maintained on Depakote as well as Lamictal as well as left MCA infarction with stenting maintained on Brilinta as well as Plavix receiving inpatient rehab services 12/21/2020 - 12/30/2020.  He was discharged to home ambulating 150 feet without assistive device contact-guard.  Per chart review patient lives alone. 1 level apartment with elevator. Has a home health aide 53 hours a week to assist with making meals dressing bathing managing medications.  Sister does finances and his POA.  Presented 08/27/2022 from outside hospital after admission 1/20-27 with altered mental status.  He was noted to have persistent punctate focus of restricted diffusion in the left temporal medial lobe concerning for acute/subacute CVA on imaging at outside hospital.  MRI showed trace SDH that developed during the hospitalization 4 mm in the left occipital region.  He was transferred to [name]. Cranial CT scan showed stable trace subdural hematoma along the posterior falx and posteriorly occipital lobe. Stable remote infarcts of the left basal ganglia, anterior left frontal lobe and corona radiata but no acute CVA noted.  Stable left MCA stent. Currently on dysphagia #2 nectar thick liquid diet. Therapy evaluations completed due to patient decreased functional mobility altered mental status/dysphagia was admitted for a comprehensive rehab program.   Pt is known to this service s/p L MCA stroke. Pt was discharged from SLP services in CIR on  12/30/20 at the min-to-mod A level for basic language and cognitive tasks; presented with no swallowing needs at the time.   Pt presents today with an expressive-receptive aphasia with expressive deficits appearing more significant than receptive. Pt also appears to present with a cognitive impairment impacting orientation, recall (?), and awareness, however evaluation was limited secondary to drowsiness and minimal participation. Pt exhibited mostly single word responses to basic yes/no questions; minimal responses to open-ended questions. Pt made minimal eye contact with SLP and made no attempts to interact and/or initiate meaningful communication exchange. Per nurse tech, pt has not communicated much with staff, but was observed engaging in a simple verbal exchange over the phone at the short phrase level with a family member earlier today.   Pt would not follow any commands to complete OME, however d/t frequent yawning, SLP was able to determine pt was edentulous. Pt refused PO trials therefore was unable to complete BSE today. Per nurse tech, pt consumed Dys 2 textures and nectar thick liquids with recent meals without overt s/sx of aspiration and no observable chewing/swallowing difficulty with current diet. Recommend continuation of dysphagia 2 textures with nectar thick liquids, pills crushed or whole  in puree as tolerated (as per MBS recommendations), full supervision, limit environmental distractions, and assess for pocketing.   [name], Sharyn Lull, arrived toward middle-to-end of session and provided insight into pt's PLOF. She reported pt's current communication and cognition appears near baseline, with unresolved deficits from previous CVA. Pt was mostly independent and living alone, but received support from a hired aid at home for ADLs and iADLs. Recommend ongoing discussion with family to determine if pt is at/near cognitive-linguistic baseline for appropriateness of current cognitive/language  goals.   SLP recommends skilled ST intervention for speech/language/cognition and treatment of oropharyngeal dysphagia.    Skilled Therapeutic Interventions          Pt participated in informal speech/language/cognitive evaluation. See report for full details   SLP Assessment  Patient will need skilled Speech Lanaguage Pathology Services during CIR admission    Recommendations  SLP Diet Recommendations: Dysphagia 2 (Fine chop);Nectar Liquid Administration via: Cup Medication Administration: Whole meds with puree Supervision: Staff to assist with self feeding;Full supervision/cueing for compensatory strategies Compensations: Slow rate;Small sips/bites;Minimize environmental distractions;Lingual sweep for clearance of pocketing Postural Changes and/or Swallow Maneuvers: Seated upright 90 degrees Oral Care Recommendations: Oral care BID Patient destination: Home Follow up Recommendations: 24 hour supervision/assistance Equipment Recommended: To be determined    SLP Frequency 3 to 5 out of 7 days   SLP Duration  SLP Intensity  SLP Treatment/Interventions ~2 weeks  Minumum of 1-2 x/day, 30 to 90 minutes  Other (comment);Dysphagia/aspiration precaution training;Patient/family education;Speech/Language facilitation (further cognitive-linguistic/language evaluation)    Pain Pain Assessment Pain Scale: Faces Faces Pain Scale: Hurts even more (when performing R LE hip/knee flexion) Pain Type: Other (Comment) (pain only with this movement when supine in bed) Pain Location: Leg Pain Orientation: Right Pain Descriptors / Indicators: Spasm;Other (Comment) (tone response) Pain Onset: Other (Comment) (only with this movement but calmed at rest in hooklying position) Pain Intervention(s): Rest;Repositioned  Prior Functioning Cognitive/Linguistic Baseline: Baseline deficits Baseline deficit details: baseline cognitive-linguistic deficits following prior CVA Type of Home: Apartment  Lives  With: Alone Available Help at Discharge: Family;Available 24 hours/day Vocation: Retired  Programmer, systems Overall Cognitive Status: History of cognitive impairments - at baseline Arousal/Alertness: Lethargic Orientation Level: Oriented to person Year:  (did not respond; difficult to determine if due to aphasia or minimal interest in engaging during assessment) Month:  (did not respond; difficult to determine if due to aphasia or minimal interest in engaging during assessment) Day of Week:  (did not respond; difficult to determine if due to aphasia or minimal interest in engaging during assessment) Attention: Focused Focused Attention: Impaired Memory: Impaired Awareness: Impaired Awareness Impairment: Intellectual impairment  Comprehension Auditory Comprehension Overall Auditory Comprehension: Impaired Yes/No Questions: Impaired Basic Biographical Questions: 0-25% accurate Basic Immediate Environment Questions: 0-24% accurate Complex Questions: 0-24% accurate Commands: Impaired One Step Basic Commands: 0-24% accurate Conversation: Simple Interfering Components: Attention Expression Expression Primary Mode of Expression: Verbal Verbal Expression Overall Verbal Expression: Impaired Automatic Speech: Name Level of Generative/Spontaneous Verbalization: Word;Phrase Naming: Impairment Pragmatics: Impairment Impairments: Abnormal affect;Eye contact;Monotone Interfering Components: Attention;Premorbid deficit Non-Verbal Means of Communication: Not applicable Written Expression Dominant Hand: Right Oral Motor Oral Motor/Sensory Function Overall Oral Motor/Sensory Function: Generalized oral weakness Motor Speech Overall Motor Speech: Other (comment) (difficult to assess due to minimal verbal communcation)  Care Tool Care Tool Cognition Ability to hear (with hearing aid or hearing appliances if normally used Ability to hear (with hearing aid or hearing appliances if  normally used): 1. Minimal difficulty - difficulty  in some environments (e.g. when person speaks softly or setting is noisy)   Expression of Ideas and Wants Expression of Ideas and Wants: 1. Rarely/Never expressess or very difficult - rarely/never expresses self or speech is very difficult to understand   Understanding Verbal and Non-Verbal Content Understanding Verbal and Non-Verbal Content: 2. Sometimes understands - understands only basic conversations or simple, direct phrases. Frequently requires cues to understand  Memory/Recall Ability Memory/Recall Ability : None of the above were recalled   Bedside Swallowing Assessment General Date of Onset: 08/26/22 Previous Swallow Assessment: BSE 08/27/2022, MBS 08/30/22 Diet Prior to this Study: Dysphagia 2 (chopped);Nectar-thick liquids Temperature Spikes Noted: No Respiratory Status: Room air History of Recent Intubation: No Behavior/Cognition: Lethargic/Drowsy Oral Cavity - Dentition: Edentulous Self-Feeding Abilities: Needs assist;Needs set up Baseline Vocal Quality: Low vocal intensity Volitional Cough: Cognitively unable to elicit Volitional Swallow:  (made no attempts)  Oral Care Assessment Oral Assessment  (WDL): Exceptions to WDL Lips: Symmetrical Teeth: Missing (Comment) Tongue: Pink Mucous Membrane(s): Moist Saliva: Moist, saliva free flowing Level of Consciousness: Alert Is patient on any of following O2 devices?: None of the above Nutritional status: Dysphagia Oral Assessment Risk : High Risk Ice Chips Ice chips: Not tested Thin Liquid Thin Liquid: Not tested Nectar Thick Nectar Thick Liquid: Not tested (refused) Honey Thick Honey Thick Liquid:  (refused) Puree Puree:  (refused) Solid Solid:  (refused) BSE Assessment Risk for Aspiration Impact on safety and function: Mild aspiration risk;Moderate aspiration risk Other Related Risk Factors: Previous CVA;Cognitive impairment;Deconditioning  Short Term  Goals: Week 1: SLP Short Term Goal 1 (Week 1): Patient will consume current diet with minimal overt s/sx of aspiration with min A for implementation of swallowing precautions/strategies SLP Short Term Goal 2 (Week 1): Patient will participate in further cognitive-linguistic, speech/language evaluation with 100% completion SLP Short Term Goal 3 (Week 1): Patient will respond to basic yes/no questions pertaining to environment and biographical information with 50% accuracy given mod A verbal/visual cues SLP Short Term Goal 4 (Week 1): Patient will follow 1-step commads with 50% accuracy given mod A verbal/visual cues SLP Short Term Goal 5 (Week 1): Patient will name objects with 50% accuracy given max A multimodal cues SLP Short Term Goal 6 (Week 1): Patient will orient x4 with max A multimodal cues  Refer to Care Plan for Long Term Goals  Recommendations for other services: None   Discharge Criteria: Patient will be discharged from SLP if patient refuses treatment 3 consecutive times without medical reason, if treatment goals not met, if there is a change in medical status, if patient makes no progress towards goals or if patient is discharged from hospital.  The above assessment, treatment plan, treatment alternatives and goals were discussed and mutually agreed upon: by family  Patty Sermons 09/01/2022, 4:22 PM

## 2022-09-01 NOTE — Progress Notes (Addendum)
MD notified of patients blood glucose of 64. Patient was given 8oz of orange juice will recheck blood sugar in 15 minutes. No new orders obtained at this time.  Rechecked blood glucose it is now 72.

## 2022-09-01 NOTE — Progress Notes (Signed)
Inpatient Rehabilitation  Patient information reviewed and entered into eRehab system by Alpha Mysliwiec M. Gwyn Mehring, M.A., CCC/SLP, PPS Coordinator.  Information including medical coding, functional ability and quality indicators will be reviewed and updated through discharge.    

## 2022-09-01 NOTE — Progress Notes (Signed)
[facility] Individual Statement of Services  Patient Name:  Melvin Taylor  Date:  09/01/2022  Welcome to the [name].  Our goal is to provide you with an individualized program based on your diagnosis and situation, designed to meet your specific needs.  With this comprehensive rehabilitation program, you will be expected to participate in at least 3 hours of rehabilitation therapies Monday-Friday, with modified therapy programming on the weekends.  Your rehabilitation program will include the following services:  Physical Therapy (PT), Occupational Therapy (OT), Speech Therapy (ST), 24 hour per day rehabilitation nursing, Therapeutic Recreaction (TR), Neuropsychology, Care Coordinator, Rehabilitation Medicine, Nutrition Services, Pharmacy Services, and Other  Weekly team conferences will be held on Wednesdays to discuss your progress.  Your Inpatient Rehabilitation Care Coordinator will talk with you frequently to get your input and to update you on team discussions.  Team conferences with you and your family in attendance may also be held.  Expected length of stay:  16-18 Days  Overall anticipated outcome: Supervision to Min A   Depending on your progress and recovery, your program may change. Your Inpatient Rehabilitation Care Coordinator will coordinate services and will keep you informed of any changes. Your Inpatient Rehabilitation Care Coordinator's name and contact numbers are listed  below.  The following services may also be recommended but are not provided by the [facility]:   [place] will be made to provide these services after discharge if needed.  Arrangements include referral to agencies that provide these services.  Your insurance has been verified to be:  Medicaid [place] Your primary doctor is:  [name], FNP  Pertinent  information will be shared with your doctor and your insurance company.  Inpatient Rehabilitation Care Coordinator:  Erlene Quan, [place] or [phone]  Information discussed with and copy given to patient by: Dyanne Iha, 09/01/2022, 10:25 AM

## 2022-09-02 DIAGNOSIS — G9341 Metabolic encephalopathy: Secondary | ICD-10-CM | POA: Diagnosis not present

## 2022-09-02 LAB — GLUCOSE, CAPILLARY
Glucose-Capillary: 60 mg/dL — ABNORMAL LOW (ref 70–99)
Glucose-Capillary: 106 mg/dL — ABNORMAL HIGH (ref 70–99)
Glucose-Capillary: 129 mg/dL — ABNORMAL HIGH (ref 70–99)
Glucose-Capillary: 113 mg/dL — ABNORMAL HIGH (ref 70–99)
Glucose-Capillary: 171 mg/dL — ABNORMAL HIGH (ref 70–99)

## 2022-09-02 MED ORDER — DIAZEPAM 2 MG PO TABS
2.0000 mg | ORAL_TABLET | Freq: Every day | ORAL | Status: DC
  Administered 2022-09-03 – 2022-09-08 (×6): 2 mg via ORAL
  Filled 2022-09-02 (×6): qty 1

## 2022-09-02 MED ORDER — VALPROIC ACID 250 MG/5ML PO SOLN
500.0000 mg | Freq: Two times a day (BID) | ORAL | Status: DC
  Administered 2022-09-02 – 2022-09-15 (×27): 500 mg via ORAL
  Filled 2022-09-02 (×27): qty 10

## 2022-09-02 NOTE — Progress Notes (Signed)
Patient confused calling his sister many times asking her to bring food. Patient has had his meals each time as scheduled but will not eat his entire meal, or will state he is not hungry. Patient's sister called to inform me the patient called her stating he was hungry, informed her he just ate. She states the patient said he was "still hungry", went in to ask him- he states he is not hungry. Nurse tech informed this nurse the patient did this with her as well, she also states he informed his family "we are not feeding him."

## 2022-09-02 NOTE — Progress Notes (Signed)
PROGRESS NOTE   Subjective/Complaints:  Reviewed xray , no significant findings  Needs assist with feeding , somnolent   Appetite variable , cannot give a good hx due to mental status, per CNA somnolence interferes with feeding   ROS- cannot obtain due to lethargy , poor attention  Objective:   DG Shoulder Right Port  Result Date: 09/01/2022 CLINICAL DATA:  Right shoulder pain. EXAM: RIGHT SHOULDER - 1 VIEW COMPARISON:  Right shoulder radiographs 12/15/2021 FINDINGS: Mild acromioclavicular joint space narrowing and peripheral osteophytosis, similar to prior. The glenohumeral joint space is maintained. No acute fracture or dislocation. The visualized portion of the right lung is unremarkable. Status post median sternotomy. Mild-to-moderate calcification within the aortic arch. IMPRESSION: Mild acromioclavicular osteoarthritis. Electronically Signed   By: Yvonne Kendall M.D.   On: 09/01/2022 15:08   Recent Labs    09/01/22 0613  WBC 5.9  HGB 10.1*  HCT 28.5*  PLT 175    Recent Labs    09/01/22 0613  NA 133*  K 4.6  CL 104  CO2 22  GLUCOSE 62*  BUN 12  CREATININE 1.03  CALCIUM 7.9*     Intake/Output Summary (Last 24 hours) at 09/02/2022 6333 Last data filed at 09/02/2022 5456 Gross per 24 hour  Intake 416 ml  Output 225 ml  Net 191 ml         Physical Exam: Vital Signs Blood pressure 127/69, pulse (!) 51, temperature 98.1 F (36.7 C), temperature source Oral, resp. rate 16, height '5\' 5"'$  (1.651 m), weight 53.2 kg, SpO2 100 %.   General: No acute distress Mood and affect are appropriate Heart: Regular rate and rhythm no rubs murmurs or extra sounds Lungs: Clear to auscultation, breathing unlabored, no rales or wheezes Abdomen: Positive bowel sounds, soft nontender to palpation, nondistended Extremities: No clubbing, cyanosis, or edema Skin: No evidence of breakdown, no evidence of rash Neurologic: Cranial  nerves II through XII intact, motor strength is 4/5 in bilateral , bicep, tricep, grip, hip flexor, knee extensors, ankle dorsiflexor and plantar flexor, left delt RIght deltoid MMT limited by pain    Musculoskeletal:RIght shoulder pain limits abd and flexion    Assessment/Plan: 1. Functional deficits which require 3+ hours per day of interdisciplinary therapy in a comprehensive inpatient rehab setting. Physiatrist is providing close team supervision and 24 hour management of active medical problems listed below. Physiatrist and rehab team continue to assess barriers to discharge/monitor patient progress toward functional and medical goals  Care Tool:  Bathing    Body parts bathed by patient: Chest, Abdomen, Right upper leg, Left upper leg, Face   Body parts bathed by helper: Front perineal area, Buttocks, Left lower leg, Right lower leg, Left arm, Right arm     Bathing assist Assist Level: Maximal Assistance - Patient 24 - 49%     Upper Body Dressing/Undressing Upper body dressing   What is the patient wearing?: Pull over shirt    Upper body assist Assist Level: Maximal Assistance - Patient 25 - 49%    Lower Body Dressing/Undressing Lower body dressing      What is the patient wearing?: Underwear/pull up, Pants     Lower  body assist Assist for lower body dressing: Maximal Assistance - Patient 25 - 49%     Toileting Toileting    Toileting assist Assist for toileting: Dependent - Patient 0%     Transfers Chair/bed transfer  Transfers assist     Chair/bed transfer assist level: Moderate Assistance - Patient 50 - 74%     Locomotion Ambulation   Ambulation assist      Assist level: 2 helpers (+2 BHHA min assist) Assistive device: Hand held assist Max distance: 73f   Walk 10 feet activity   Assist     Assist level: 2 helpers Assistive device: Hand held assist   Walk 50 feet activity   Assist Walk 50 feet with 2 turns activity did not occur:  Safety/medical concerns         Walk 150 feet activity   Assist Walk 150 feet activity did not occur: Safety/medical concerns         Walk 10 feet on uneven surface  activity   Assist Walk 10 feet on uneven surfaces activity did not occur: Safety/medical concerns         Wheelchair     Assist Is the patient using a wheelchair?: Yes (for transport) Type of Wheelchair: Manual    Wheelchair assist level: Dependent - Patient 0%      Wheelchair 50 feet with 2 turns activity    Assist        Assist Level: Dependent - Patient 0%   Wheelchair 150 feet activity     Assist      Assist Level: Dependent - Patient 0%   Blood pressure 127/69, pulse (!) 51, temperature 98.1 F (36.7 C), temperature source Oral, resp. rate 16, height '5\' 5"'$  (1.651 m), weight 53.2 kg, SpO2 100 %.  Medical Problem List and Plan: 1. Functional deficits secondary to acute metabolic encephalopathy.  Stable trace subdural hematoma along the posterior falx and posterior occipital lobe.  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  Received inpatient rehab services 12/21/2020 - 12/30/2020 after CVA             -patient may 16-18 days shower             -ELOS/Goals: PT/OT/SLP Supervision to MIN A             -Admit to CIR, evaluations tomorrow by PT OT and SLP 2.  Antithrombotics: -DVT/anticoagulation:  Mechanical:  Antiembolism stockings, knee (TED hose) Bilateral lower extremities             -antiplatelet therapy: Brilinta 90 mg twice daily 3. Pain Management: Tylenol as needed             RIght shoulder pain- Mild AC arthritis likely incidental finding  4. Mood/Behavior/Sleep: Provide emotional support.  Valium 5 mg daily Lexapro 20 mg daily, Depakote 500 mg twice daily             -antipsychotic agents: N/A 5. Neuropsych/cognition: This patient is not capable of making decisions on his own behalf. 6.  Skin/Wound Care: Routine skin checks 7. Fluids/Electrolytes/Nutrition: Routine in and outs with follow-up chemistries 8.  Seizure disorder/epilepsy.  EEG negative.  Keppra 1000 mg every 12 hours, Depakote 500 mg twice a day 9.  Diabetes mellitus.  Currently SSI.  Patient on Lantus insulin 40 units nightly and Amaryl 2 mg daily prior to admission.  Resume as needed.  CBG (last 3)  Recent Labs  09/01/22 2041 09/02/22 0541 09/02/22 0651  GLUCAP 132* 60* 106*    10.  Hyperlipidemia.  Lipitor '80mg'$  daily 11.  CAD with CABG 2017.  Continue Imdur 30 mg daily.  No chest pain or shortness of breath. 12.  History of hypertension.  Patient on Norvasc 10 mg daily, Lopressor 12.5 mg twice daily, lisinopril 5 mg daily, BP well controlled overall 2/2 Vitals:   09/01/22 1942 09/02/22 0338  BP: 99/65 127/69  Pulse: 60 (!) 51  Resp: 17 16  Temp: 98 F (36.7 C) 98.1 F (36.7 C)  SpO2: 100% 100%    13.  COVID-19.  Resolved.  Precautions have been discontinued    LOS: 2 days A FACE TO FACE EVALUATION WAS PERFORMED  Charlett Blake 09/02/2022, 9:18 AM

## 2022-09-02 NOTE — Progress Notes (Signed)
Occupational Therapy Session Note  Patient Details  Name: DAT DERKSEN MRN: [id number] Date of Birth: 1966-03-12  Today's Date: 09/02/2022 OT Individual Time: 1116-1200 OT Individual Time Calculation (min): 44 min  OT Individual Time: 1430-1530 OT Individual Time Calculation (min): 60 min   Short Term Goals: Week 1:  OT Short Term Goal 1 (Week 1): Pt will consistently complete toilet transfer min A sing LRAD OT Short Term Goal 2 (Week 1): Pt will utilize RUE during fucntional tasks ~50% of time with mod cueing OT Short Term Goal 3 (Week 1): Pt will maintain dynamic sitting balance CGA using LRAD OT Short Term Goal 4 (Week 1): Pt will sustain attention during BADL/functional task in quiet environment with mod cueing  Skilled Therapeutic Interventions/Progress Updates:     Pt received sitting up in wc presenting to be in good spirits and receptive to skilled OT session. Pt reporting 0/10. Presenting with flat affect, however improvement in responding to therapist questions and initiating conversations during session.  Pt sister calling at beginning of session with Pt appropriately responding in short phrases and attending to phone call without difficulty. Pt also able to recall breakfast to inform sister of meal. Challenges noted with verbalizing more complex phrases and verbalizing needs requiring mod A. Pt transported to ADL apartment total A in his wc. Pt engaged in functional cognition task seated at table to address attention and problem solving deficits and to facilitate BUE use within functional task in quiet environment. Pt able to sort familiar items into 3 categories with min verbal cues to correct single mistake and for attention to continue task.  Pt completed R NMR activity seated at table with facilitation of functional reaching and grasp/release using familiar items of cups. Pt able to grasp average sized grasps with RUE to stack and un-stack cups x5 with Pt noted to compensate  for decreased shoulder strength with bicep flexion and trunk positioning. Verbal and tactile cues provided to support correct functional movement patterns.  Pt transported back to room total A in wc. Pt was left resting in wc with call bell in reach and seat belt  alarm on with nursing staff entering room upon OT departure.   PM Session: Pt received sitting up in wc presenting to be in good spirits, however very sleepy this afternoon. Pt receptive to skilled OT session reporting 0/10 pain. Focused beginning of session on continuing to build rapport with Pt by inquiring about events of the day with Pt able to recall snack provided by SLP and walking with PT during earlier sessions. Pt transported total A to therapy gym in wc. Pt completed stand step transfer to EOM with mod A HHA with facilitation required for right/left weight shifting and lifting RLE from ground. Pt able to maintain static sitting balance EOM with CGA. Facilitated anterior weight shifting and trunk flexion siting EOM as a preparatory skill for sit to stand with mod A and verbal cueing required throughout task. Pt then completed dynamic sitting balance activities with R inferior reaching components facilitated during task. Pt able to reach inferiorly to retrieve medium sized pegs and place them in board with CGA to min A. Pt utilized pegs to replicate picture with pt requiring mod cues to attend to picture and correct mistakes. Pt presenting with decreased participation this session compared to AM session d/t fatigue. Pt completed stand step transfer back to wc in similar fashion as before. Pt transported back to room total A in wc. Squat pivot transfer back  to bed with prolonged weightbearing facilitated during transfer. Pt sit>supine min A to lift legs with Pt able to follow verbal cues to place head on pillow to lower trunk. Pt left resting in bed with call bell in reach, bed alarm on, and all needs met.   Therapy Documentation Precautions:   Precautions Precautions: Fall, Other (comment) Precaution Comments: R hemiplegia with R UE flexor tone and R LE extensor tone (LE tone primarily in the bed), Aphasia, delayed processing Required Braces or Orthoses: Other Brace Other Brace: R UE aircast for tone management Restrictions Weight Bearing Restrictions: No General:   Vital Signs:  Pain: Pain Assessment Pain Scale: 0-10 Pain Score: Asleep Faces Pain Scale: No hurt PAINAD (Pain Assessment in Advanced Dementia) Breathing: occasional labored breathing, short period of hyperventilation Negative Vocalization: occasional moan/groan, low speech, negative/disapproving quality Facial Expression: sad, frightened, frown Body Language: tense, distressed pacing, fidgeting Consolability: no need to console PAINAD Score: 4 ADL: ADL Eating: Not assessed Grooming: Maximal assistance Where Assessed-Grooming: Edge of bed Upper Body Bathing: Moderate assistance Where Assessed-Upper Body Bathing: Edge of bed Lower Body Bathing: Maximal assistance Where Assessed-Lower Body Bathing: Edge of bed Upper Body Dressing: Maximal assistance Where Assessed-Upper Body Dressing: Edge of bed Lower Body Dressing: Maximal assistance Where Assessed-Lower Body Dressing: Edge of bed Toileting: Dependent Where Assessed-Toileting: Bed level Toilet Transfer: Moderate assistance Toilet Transfer Method: Sit pivot Toilet Transfer Equipment: Engineer, technical sales Transfer: Not assessed Social research officer, government: Moderate assistance Social research officer, government Method: Radiographer, therapeutic: Grab bars, Transfer tub bench   Therapy/Group: Individual Therapy  Janey Genta 09/02/2022, 7:50 AM

## 2022-09-02 NOTE — IPOC Note (Signed)
Overall Plan of Care [facility]) Patient Details Name: Melvin Taylor MRN: [id number] DOB: [birth year]  Admitting Diagnosis: Acute metabolic encephalopathy  Hospital Problems: Principal Problem:   Acute metabolic encephalopathy Active Problems:   Primary hypertension   Hyperlipidemia     Functional Problem List: Nursing Behavior, Bladder, Bowel, Safety, Sensory, Skin Integrity, Endurance, Motor  PT Balance, Perception, Behavior, Safety, Edema, Sensory, Endurance, Skin Integrity, Motor, Nutrition, Pain  OT Balance, Endurance, Safety, Motor, Behavior, Cognition, Pain, Skin Integrity  SLP Cognition, Linguistic, Nutrition  TR         Basic ADL's: OT Eating, Grooming, Bathing, Dressing, Toileting     Advanced  ADL's: OT       Transfers: PT Bed Mobility, Bed to Chair, Car, Furniture, Floor  OT Tub/Shower, Agricultural engineer: PT Ambulation, Stairs     Additional Impairments: OT Fuctional Use of Upper Extremity  SLP Swallowing, Communication, Social Cognition expression, comprehension Awareness, Memory, Attention  TR      Anticipated Outcomes Item Anticipated Outcome  Self Feeding Supervision  Swallowing  sup A   Basic self-care  Supervision to min A  Toileting  Min A   Bathroom Transfers Supervision  Bowel/Bladder  continent B/B  Transfers  supervision using LRAD  Locomotion  supervision using LRAD  Communication  min A  Cognition  TBD  Pain  less than 3  Safety/Judgment  remain fall free while in rehab   Therapy Plan: PT Intensity: Minimum of 1-2 x/day ,45 to 90 minutes PT Frequency: 5 out of 7 days PT Duration Estimated Length of Stay: ~ 2 weeks OT Intensity: Minimum of 1-2 x/day, 45 to 90 minutes OT Frequency: 5 out of 7 days OT Duration/Estimated Length of Stay: 2 weeks SLP Intensity: Minumum of 1-2 x/day, 30 to 90 minutes SLP Frequency: 3 to 5 out of 7 days SLP Duration/Estimated Length of Stay: ~2 weeks   Team Interventions: Nursing  Interventions Patient/Family Education, Dysphagia/Aspiration Precaution Training, Bladder Management, Discharge Planning, Bowel Management, Skin Care/Wound Management, Psychosocial Support, Disease Management/Prevention, Cognitive Remediation/Compensation  PT interventions Ambulation/gait training, Community reintegration, DME/adaptive equipment instruction, Neuromuscular re-education, Psychosocial support, Stair training, UE/LE Strength taining/ROM, Training and development officer, Functional electrical stimulation, Discharge planning, Pain management, Skin care/wound management, UE/LE Coordination activities, Therapeutic Activities, Cognitive remediation/compensation, Disease management/prevention, Functional mobility training, Patient/family education, Splinting/orthotics, Therapeutic Exercise, Visual/perceptual remediation/compensation  OT Interventions Training and development officer, Community reintegration, Disease mangement/prevention, Functional electrical stimulation, Neuromuscular re-education, Patient/family education, Self Care/advanced ADL retraining, Therapeutic Exercise, UE/LE Coordination activities, Wheelchair propulsion/positioning, Cognitive remediation/compensation, Discharge planning, DME/adaptive equipment instruction, Functional mobility training, Pain management, Psychosocial support, Skin care/wound managment, Therapeutic Activities, UE/LE Strength taining/ROM, Visual/perceptual remediation/compensation  SLP Interventions Other (comment), Dysphagia/aspiration precaution training, Patient/family education, Speech/Language facilitation (further cognitive-linguistic/language evaluation)  TR Interventions    SW/CM Interventions Discharge Planning, Psychosocial Support, Patient/Family Education, Disease Management/Prevention   Barriers to Discharge MD  Medical stability  Nursing Decreased caregiver support, Incontinence, Wound Care, Behavior lives alone with HH aid 53 hr/wk in 1 level apt with  elevator. Sister Research officer, trade union and is POA  PT Behavior, Lack of/limited family support, Decreased caregiver support, Nutrition means    OT      SLP Behavior (limited participation at eval, unsure of compliance)    [place] for SNF coverage, Decreased caregiver support, Lack of/limited family support     Team Discharge Planning: Destination: PT-Home ,OT- Home , SLP-Home Projected Follow-up: PT-Home health PT, 24 hour supervision/assistance, OT-  Home health OT, SLP-24 hour supervision/assistance Projected Equipment Needs: PT-To  be determined, OT- To be determined, SLP-To be determined Equipment Details: PT- , OT-  Patient/family involved in discharge planning: PT- Patient,  OT-Patient, SLP-Family member/caregiver  MD ELOS: 16-18d Medical Rehab Prognosis:  Good Assessment: The patient has been admitted for CIR therapies with the diagnosis of metabolic encephalopathy . The team will be addressing functional mobility, strength, stamina, balance, safety, adaptive techniques and equipment, self-care, bowel and bladder mgt, patient and caregiver education, BP management . Goals have been set at [place] . Anticipated discharge destination is Home .        See Team Conference Notes for weekly updates to the plan of care

## 2022-09-02 NOTE — Evaluation (Signed)
Bedside Swallow Examination and Daily Session Note  Patient Details  Name: Melvin Taylor MRN: [id number] Date of Birth: 10-07-1965  SLP Diagnosis: Dysphagia  Rehab Potential: Good ELOS: ~2 weeks    Today's Date: 09/02/2022 SLP Individual Time: 1015-1100 SLP Individual Time Calculation (min): 50 min  Hospital Problem: Principal Problem:   Acute metabolic encephalopathy Active Problems:   Primary hypertension   Hyperlipidemia  Past Medical History:  Past Medical History:  Diagnosis Date   Bipolar 1 disorder ([place])    depression/anxiety   Bone spur    left heel   Cervical radiculopathy    Chronic back pain    Chronic chest wall pain    Chronic neck pain    Coronary artery disease    a. s/p CABG in 03/2016 with LIMA-LAD, SVG-D1, SVG-RCA, and Seq SVG-mid and distal OM   Diabetes mellitus    Type II   Hallucinations    "long history of them"   Headache(784.0)    History of gout    HTN (hypertension)    Insomnia    Neuropathy    Pain management    Polysubstance abuse ([place])    Right leg pain    chronic   Schizophrenia ([place])    Seizures ([place])    last sz between July 5-9th, 2016; epilepsy   Sleep apnea    Stroke ([place])    Transfusion of blood product refused for religious reason    Past Surgical History:  Past Surgical History:  Procedure Laterality Date   ANTERIOR CERVICAL DECOMP/DISCECTOMY FUSION N/A 12/14/2015   Procedure: ANTERIOR CERVICAL DECOMPRESSION/DISCECTOMY FUSION CERVICAL FIVE -SIX;  Surgeon: Earnie Larsson, MD;  [facility]: [facility];  Service: Neurosurgery;  Laterality: N/A;   BIOPSY  05/07/2015   Procedure: BIOPSY (Gastric);  Surgeon: [name], MD;  Location: [facility];  Service: Endoscopy;;   CARDIAC CATHETERIZATION N/A 03/07/2016   Procedure: Left Heart Cath and Coronary Angiography;  Surgeon: Belva Crome, MD;  Location: [facility];  Service: Cardiovascular;  Laterality: N/A;   COLONOSCOPY WITH PROPOFOL N/A 05/07/2015   EQA:[id number]  diverticulosis, multiple colon polyps removed, tubular adenoma, serrated colon polyp. Next colonoscopy October 2019   COLONOSCOPY WITH PROPOFOL N/A 08/02/2018   Procedure: COLONOSCOPY WITH PROPOFOL;  Surgeon: [name], MD;  Location: [facility];  Service: Endoscopy;  Laterality: N/A;  12:00pm   CORONARY ARTERY BYPASS GRAFT N/A 03/28/2016   Procedure: CORONARY ARTERY BYPASS GRAFTING (CABG) x 5 USING GREATER SAPHENOUS VEIN;  Surgeon: [name], MD;  Location: [facility];  Service: Open Heart Surgery;  Laterality: N/A;   ENDOVEIN HARVEST OF GREATER SAPHENOUS VEIN Right 03/28/2016   Procedure: ENDOVEIN HARVEST OF GREATER SAPHENOUS VEIN;  Surgeon: [name], MD;  Location: [facility];  Service: Open Heart Surgery;  Laterality: Right;   ESOPHAGEAL DILATION N/A 05/07/2015   Procedure: ESOPHAGEAL DILATION WITH 56FR MALONEY DILATOR;  Surgeon: [name], MD;  Location: [facility];  Service: Endoscopy;  Laterality: N/A;   ESOPHAGOGASTRODUODENOSCOPY (EGD) WITH PROPOFOL N/A 05/07/2015   RMR: Status post dilation of normal esophagus. Gastritis.   IR CT HEAD LTD  12/17/2020   IR CT HEAD LTD  12/17/2020   IR INTRA CRAN STENT  12/17/2020   IR PERCUTANEOUS ART THROMBECTOMY/INFUSION INTRACRANIAL INC DIAG ANGIO  12/17/2020   IR RADIOLOGIST EVAL & MGMT  03/25/2021   KNEE SURGERY Left    arthroscopy   MANDIBLE FRACTURE SURGERY     POLYPECTOMY  05/07/2015  Procedure: POLYPECTOMY (Hepatic Flexure, Distal Transverse Colon, Rectal);  Surgeon: [name], MD;  Location: [facility];  Service: Endoscopy;;   RADIOLOGY WITH ANESTHESIA N/A 12/16/2020   Procedure: IR WITH ANESTHESIA;  Surgeon: Luanne Bras, MD;  Location: [facility];  Service: Radiology;  Laterality: N/A;   TEE WITHOUT CARDIOVERSION N/A 03/28/2016   Procedure: TRANSESOPHAGEAL ECHOCARDIOGRAM (TEE);  Surgeon: [name], MD;  Location: [facility];  Service: Open Heart Surgery;  Laterality: N/A;   TONSILLECTOMY     TRIGGER FINGER RELEASE Left 10/15/2021    Procedure: RELEASE TRIGGER FINGER/A-1 PULLEY left middle or long finger;  Surgeon: Carole Civil, MD;  Location: [facility];  Service: Orthopedics;  Laterality: Left;    Assessment / Plan / Recommendation Clinical Impression  Pt was seen for a bedside swallow evaluation due to being unable to complete yesterday 2/2 drowsiness and limited participation. OME revealed what appears to be generalized oral weakness. Pt stated he purchased dentures but does not have access to them yet, therefore edentulous at this time. Pt consumed sips of nectar thick liquids by cup and straw with what appeared to be suspected swallow initiation delay, good oral containment, and without clinical s/sx concerning for aspiration. Vocal quality was clear post swallows. Pt consumed pureed textures with what appeared to be mildly prolonged AP transit and minimal oral residuals post swallow. This was effectively cleared with NTL rinse. Pt consumed dysphagia 2 textures with mildly prolonged yet effective mastication, minimal oral residuals post swallow. There were no overt s/sx of aspiration with solids. Pt was able to perform self feeding with set-up A but according to nurse tech, pt is reluctant to feed himself. SLP recommends continuation of dysphagia 2 textures with nectar thick liquids, crushed medications, and full supervision.    Skilled Therapeutic Interventions          In addition to BSE, SLP also facilitated cognitive-linguistic and language goals. Pt responded to basic y/n questions pertaining to immediate environment and biographical information with mod A verbal cues during 50% of occasions. Pt benefited from extended processing time for most questions. With open ended questions, pt was known to respond with mostly single word responses delayed response, some hesitation noted, and intelligible speech. Pt followed 1-step commands with mod A verbal/visual cues and extended processing time to execute. Pt presented with flat  affect and somnolence, but improved alertness and interaction with SLP as compared to yesterday's session. Pt was left in wheelchair with alarm activated and immediate needs within reach at end of session. Continue per POC.   SLP Assessment  Patient will need skilled Speech Lanaguage Pathology Services during CIR admission    Recommendations  SLP Diet Recommendations: Dysphagia 2 (Fine chop);Nectar Liquid Administration via: Cup;Straw Medication Administration: Crushed with puree Supervision: Staff to assist with self feeding;Full supervision/cueing for compensatory strategies Compensations: Slow rate;Small sips/bites;Minimize environmental distractions;Lingual sweep for clearance of pocketing Postural Changes and/or Swallow Maneuvers: Seated upright 90 degrees Oral Care Recommendations: Oral care BID Patient destination: Home Follow up Recommendations: 24 hour supervision/assistance Equipment Recommended: To be determined    SLP Frequency 3 to 5 out of 7 days   SLP Duration  SLP Intensity  SLP Treatment/Interventions ~2 weeks  Minumum of 1-2 x/day, 30 to 90 minutes  Dysphagia/aspiration precaution training;Patient/family education    Pain Pain Assessment Pain Scale: 0-10 Pain Score: 0-No pain  Care Tool Care Tool Cognition Ability to hear (with hearing aid or hearing appliances if normally used Ability to hear (with hearing aid  or hearing appliances if normally used): 1. Minimal difficulty - difficulty in some environments (e.g. when person speaks softly or setting is noisy)   Expression of Ideas and Wants Expression of Ideas and Wants: 2. Frequent difficulty - frequently exhibits difficulty with expressing needs and ideas   Understanding Verbal and Non-Verbal Content Understanding Verbal and Non-Verbal Content: 2. Sometimes understands - understands only basic conversations or simple, direct phrases. Frequently requires cues to understand  Memory/Recall Ability Memory/Recall  Ability : That he or she is in a hospital/hospital unit   Bedside Swallowing Assessment General Date of Onset: 08/26/22 Previous Swallow Assessment: BSE 08/27/2022, MBS 08/30/22 Diet Prior to this Study: Dysphagia 2 (chopped);Nectar-thick liquids Temperature Spikes Noted: No Respiratory Status: Room air History of Recent Intubation: No Behavior/Cognition: Alert;Cooperative Oral Cavity - Dentition: Edentulous Self-Feeding Abilities: Needs assist;Needs set up Patient Positioning: Upright in bed Baseline Vocal Quality: Low vocal intensity Volitional Cough: Weak Volitional Swallow: Able to elicit  Oral Care Assessment Oral Assessment  (WDL): Exceptions to WDL Lips: Symmetrical Teeth: Missing (Comment) Tongue: Pink Mucous Membrane(s): Moist Saliva: Moist, saliva free flowing Level of Consciousness: Alert Is patient on any of following O2 devices?: None of the above Nutritional status: Dysphagia Oral Assessment Risk : High Risk Ice Chips Ice chips: Not tested Thin Liquid   Nectar Thick Nectar Thick Liquid: Within functional limits Honey Thick Honey Thick Liquid: Not tested Puree Puree: Impaired Oral Phase Functional Implications: Oral residue;Prolonged oral transit Pharyngeal Phase Impairments: Suspected delayed Swallow Solid Solid: Impaired Presentation: Spoon Oral Phase Impairments: Impaired mastication Oral Phase Functional Implications: Prolonged oral transit;Oral residue Pharyngeal Phase Impairments: Suspected delayed Swallow BSE Assessment Risk for Aspiration Impact on safety and function: Mild aspiration risk;Moderate aspiration risk Other Related Risk Factors: Previous CVA;Cognitive impairment;Deconditioning  Short Term Goals: Week 1: SLP Short Term Goal 1 (Week 1): Patient will consume current diet with minimal overt s/sx of aspiration with min A for implementation of swallowing precautions/strategies SLP Short Term Goal 2 (Week 1): Patient will participate in  further cognitive-linguistic, speech/language evaluation with 100% completion SLP Short Term Goal 3 (Week 1): Patient will respond to basic yes/no questions pertaining to environment and biographical information with 50% accuracy given mod A verbal/visual cues SLP Short Term Goal 4 (Week 1): Patient will follow 1-step commads with 50% accuracy given mod A verbal/visual cues SLP Short Term Goal 5 (Week 1): Patient will name objects with 50% accuracy given max A multimodal cues SLP Short Term Goal 6 (Week 1): Patient will orient x4 with max A multimodal cues  Refer to Care Plan for Long Term Goals  Recommendations for other services: None   Discharge Criteria: Patient will be discharged from SLP if patient refuses treatment 3 consecutive times without medical reason, if treatment goals not met, if there is a change in medical status, if patient makes no progress towards goals or if patient is discharged from hospital.  The above assessment, treatment plan, treatment alternatives and goals were discussed and mutually agreed upon: by patient  Patty Sermons 09/02/2022, 4:23 PM

## 2022-09-02 NOTE — Progress Notes (Signed)
Physical Therapy Session Note  Patient Details  Name: Melvin Taylor MRN: [id number] Date of Birth: [birth year]  Today's Date: 09/02/2022 PT Individual Time:  335-4562 PT Total Treatment Time: 55 minutes-      Short Term Goals: Week 1:  PT Short Term Goal 1 (Week 1): Pt will perform supine<>sit with CGA consistently PT Short Term Goal 2 (Week 1): Pt will perform sit<>stands using LRAD with CGA PT Short Term Goal 3 (Week 1): Pt will perform bed<>chair transfers using LRAD with CGA PT Short Term Goal 4 (Week 1): Pt will ambulate at least 29f using LRAD with CGA  Skilled Therapeutic Interventions/Progress Updates:    Pt recived supine in bed asleep. Took several attempts to arouse pt, but once aroused pt was agreeable to therapy. Pt oriented to name, place but not date. Therapist observed a incontinent bowel episode. Therapist donned new brief w/ pt supine. Pt required MaxA x 2 for pericare. Therapist donned pants in supine w/ ModA w/ rolling R and L in bed. Pt transferred maxA from L sidelying to sitting at EOB. Pt still visibly lethargic. Squat pivot transfer from EOB to WC, Mod A. Min vc for pt to shift weight anteriorly to generate enough LE force for the transfer. Pt transported to therapy gym dependently.   Standing balance done in parallel bars. 2x1 min w/ Mod A. Therapist provided double knee block to R knee in order to transfer from sit to stand. Pt cued to use LUE to hold on to parallel bars. Therapist facilitating hip extension in standing. Mod vc for pt to keep head up helped to correct pts flexed trunk.   Gait training: ~58fx2 w/ Max A.Therapist facilitating B lateral weight shifts to encourage foot clearance. Pt struggled more to clear LLE during swing phase. Therapist helped guide LLE. Resisted R lateral lean, required therapist facilitation to shift weight to the R.   Pt transported back to room dependently in WC. [facility] on, call bell in reach and all needs  met.   Therapy Documentation Precautions:  Precautions Precautions: Fall, Other (comment) Precaution Comments: R hemiplegia with R UE flexor tone and R LE extensor tone (LE tone primarily in the bed), Aphasia, delayed processing Required Braces or Orthoses: Other Brace Other Brace: R UE aircast for tone management Restrictions Weight Bearing Restrictions: No General:     Pain: Pt unable to verbalize pain, faces made when attempting to move RUE. Increased flexor tone        Therapy/Group: Individual Therapy  [name] 09/02/2022, 6:14 PM

## 2022-09-03 DIAGNOSIS — G9341 Metabolic encephalopathy: Secondary | ICD-10-CM | POA: Diagnosis not present

## 2022-09-03 LAB — BASIC METABOLIC PANEL
Anion gap: 5 (ref 5–15)
BUN: 22 mg/dL — ABNORMAL HIGH (ref 6–20)
CO2: 26 mmol/L (ref 22–32)
Calcium: 7.9 mg/dL — ABNORMAL LOW (ref 8.9–10.3)
Chloride: 102 mmol/L (ref 98–111)
Creatinine, Ser: 1.64 mg/dL — ABNORMAL HIGH (ref 0.61–1.24)
GFR, Estimated: 48 mL/min — ABNORMAL LOW (ref >60)
Glucose, Bld: 107 mg/dL — ABNORMAL HIGH (ref 70–99)
Potassium: 5.7 mmol/L — ABNORMAL HIGH (ref 3.5–5.1)
Sodium: 133 mmol/L — ABNORMAL LOW (ref 135–145)

## 2022-09-03 LAB — GLUCOSE, CAPILLARY
Glucose-Capillary: 59 mg/dL — ABNORMAL LOW (ref 70–99)
Glucose-Capillary: 67 mg/dL — ABNORMAL LOW (ref 70–99)
Glucose-Capillary: 72 mg/dL (ref 70–99)
Glucose-Capillary: 106 mg/dL — ABNORMAL HIGH (ref 70–99)
Glucose-Capillary: 125 mg/dL — ABNORMAL HIGH (ref 70–99)
Glucose-Capillary: 96 mg/dL (ref 70–99)

## 2022-09-03 MED ORDER — SODIUM CHLORIDE 0.9 % IV SOLN: INTRAVENOUS | Status: AC

## 2022-09-03 NOTE — Progress Notes (Signed)
Occupational Therapy Session Note  Patient Details  Name: Melvin Taylor MRN: [id number] Date of Birth: 07/14/1966  Session 1: Today's Date: 09/03/2022 OT Individual Time: 5573-2202 OT Individual Time Calculation (min): 41 min   Session 2: OT Individual Time: 5427-0623 OT Individual Time Calculation (min): 42 min   Short Term Goals: Week 1:  OT Short Term Goal 1 (Week 1): Pt will consistently complete toilet transfer min A sing LRAD OT Short Term Goal 2 (Week 1): Pt will utilize RUE during fucntional tasks ~50% of time with mod cueing OT Short Term Goal 3 (Week 1): Pt will maintain dynamic sitting balance CGA using LRAD OT Short Term Goal 4 (Week 1): Pt will sustain attention during BADL/functional task in quiet environment with mod cueing  Skilled Therapeutic Interventions/Progress Updates:    Session 1: Patient asleep in bed upon therapy arrival. Required verbal, visual, and tactile stimuli to wake up. Alertness low at start of session although gradually increased as session progressed.  Demonstrate/verbalizes pain during bed mobility and any movement of right UE. No number provided. Monitored during session.   Patient participated in skilled OT session focusing on ADL Re-training, functional transfers, activity participation, self-feeding engagement. Dr. Tressa Busman arrived during session reporting that pt's blood sugar was low this morning. Pt verbalized that he did not eat breakfast. Dr. Tressa Busman verbalized concern that if he is not eating, pt's blood sugar will need to be checked frequently.  Therapist integrated techniques to engage pt and increase alertness such as turning on lights, sitting pt on the EOB, and attempting to engage pt in conversation in order to improve improve alertness and level of arousal. Pt followed one step commands inconsistently and with increased time during session.  Due to low level of alertness, increased physical assist was provided during session. Total A  provided to transition pt from supine to sitting on EOB. Functional transfer performance fluctuated during session with pt requiring total assist to complete sqt pvt transfer from bed to chair initially. At end of session, pt required mod A std pvt transfer from w/c to recliner.   Therapist provided thickened OJ and Ensure to patient and encouraged him to drink since he reports he did not eat breakfast. Pt drank all OJ and ~8oz of Ensure. Informed NT.   While seated at sink, pt completed face washing with set-up provided.    Session 2:  Demonstrate/verbalizes pain during bed mobility and any movement of right UE. No number provided. Monitored during session. Pt in recliner with blanket pulled up over head. Reports he did not eat lunch when asked. Did not provide a reason why when asked.   Patient participated in skilled OT session focusing on ADL re-training, sit to stand transitions, functional transfers, activity participation, and bed mobility.  Pt completed all functional transfers with Mod A during std pvt; no device. Pt demonstrates a heavy posterior lean while standing and was able to correct when cued once during session. Posterior lean present due to severe kyphotic posture causing a forward head, rounded shoulders, and posterior pelvic tilt.   Pt completed UB dressing seated at sink. Max VC along with tactile cues provided to initiate and complete task of pulling pull over shirt off. Min physical assist provided to doff shirt. OT provided same amount of cues and physical assist to doff new pull over shirt as well. Total Assist provided to doff/don new incontinent brief. Pt requested to wear same pants stating that they are clean. Encouraged increased activity participation by  providing one step commands during dressing and grooming. Pt attempted to use his right hand/UE through self care tasks although very limited use is available. During teethbrushing, pt place toothpaste in mouth versus  toothbrush although was able to complete remainder of task with VC for sequencing. Pt returned to bed with Mod A provided for stand pvt transfer. Mod A provide to transition from sit to supine.     Therapy Documentation Precautions:  Precautions Precautions: Fall, Other (comment) Precaution Comments: R hemiplegia with R UE flexor tone and R LE extensor tone (LE tone primarily in the bed), Aphasia, delayed processing Required Braces or Orthoses: Other Brace Other Brace: R UE aircast for tone management Restrictions Weight Bearing Restrictions: No   Therapy/Group: Individual Therapy  Ailene Ravel, OTR/L,CBIS  Supplemental OT - [facility] Secure Chat Preferred   09/03/2022, 7:57 AM

## 2022-09-03 NOTE — Progress Notes (Signed)
Physical Therapy Session Note  Patient Details  Name: Melvin Taylor MRN: [id number] Date of Birth: 12-18-1965  Today's Date: 09/03/2022 PT Individual Time: 1660-6301 PT Individual Time Calculation (min): 55 min   Short Term Goals: Week 1:  PT Short Term Goal 1 (Week 1): Pt will perform supine<>sit with CGA consistently PT Short Term Goal 2 (Week 1): Pt will perform sit<>stands using LRAD with CGA PT Short Term Goal 3 (Week 1): Pt will perform bed<>chair transfers using LRAD with CGA PT Short Term Goal 4 (Week 1): Pt will ambulate at least 39f using LRAD with CGA  Skilled Therapeutic Interventions/Progress Updates:    Pt received asleep, supine in bed with the covers over his head. Gentle verbal stimulus to arouse and with encouragement pt agreeable to therapy session. Pt requires increased time to initiate mobility and has delayed processing with slow verbal response to questions.   Vitals in supine: BP 95/61 (MAP 72), HR 58bpm   Donned knee high TED hose.  Supine>sitting L EOB via logroll technique with max assist for B LE management and trunk upright due to poor engagement from patient.   Able to maintain static sitting balance EOB with supervision.   Sit>stand EOB>L HHA with mod assist for rising to stand then initiated R stand pivot to w/c and pt with balance instability relying on backs of legs against bed for support resulting in him feeling unsafe to take a step and attempts to perform squat pivot instead, but doesn't rotate hips enough requiring max assist to pivot hips and land in chair safely.   Vitals in w/c: BP 94/62 (MAP 72), HR 59bpm no reports of symptoms, but pt with very limited verbal responses throughout session - starts to respond more towards end of session when pt warms up to therapist more   Transported to/from gym in w/c for time management and energy conservation.  Sit>stands to L HHA progressed to min assist throughout session with pt slow to rise and  having some muscle fatigue noticed by shaking when rising to stand.  Gait training ~512fwith +2 B HHA progressed to only +1 L HHA from therapist with min assist for balance throughout - continues to have slow gait speed, decreased B LE step lengths and decreased foot clearance, L LE excessively externally rotated, excessive thoracic kyphosis with downwards gaze, and minor posterior lean requiring assistance for balance - verbal/tactile cuing for improved posture and increased step lengths.  Vitals after 1st gait: BP 123/110 (MAP 116), HR 58bpm   Transported back to room.   Short distance ~[id number] transfer w/c>EOB with +1 L HHA providing min assist for balance.   Sit>supine from R EOB via reverse logroll with supervision. Pt left supine/partial R sidelying in bed with needs in reach and bed alarm on.    Therapy Documentation Precautions:  Precautions Precautions: Fall, Other (comment) Precaution Comments: R hemiplegia with R UE flexor tone and R LE extensor tone (LE tone primarily in the bed), Aphasia, delayed processing Required Braces or Orthoses: Other Brace Other Brace: R UE aircast for tone management Restrictions Weight Bearing Restrictions: No  Pain:  Reports some pain in RLE due to onset of muscle tone when donning TED hose - tried to modify technique to decrease pain. Otherwise, no indication of pain during session.    Therapy/Group: Individual Therapy  CarTawana ScalePT, DPT, NCS, CSRS 09/03/2022, 3:31 PM

## 2022-09-03 NOTE — Progress Notes (Signed)
Speech Language Pathology Daily Session Note  Patient Details  Name: Melvin Taylor MRN: [id number] Date of Birth: 1966/01/31  Today's Date: 09/03/2022 SLP Individual Time: 2505-3976 SLP Individual Time Calculation (min): 29 min and Today's Date: 09/03/2022 SLP Missed Time: 31 Minutes Missed Time Reason: Patient unwilling to participate;Patient fatigue  Short Term Goals: Week 1: SLP Short Term Goal 1 (Week 1): Patient will consume current diet with minimal overt s/sx of aspiration with min A for implementation of swallowing precautions/strategies SLP Short Term Goal 2 (Week 1): Patient will participate in further cognitive-linguistic, speech/language evaluation with 100% completion SLP Short Term Goal 3 (Week 1): Patient will respond to basic yes/no questions pertaining to environment and biographical information with 50% accuracy given mod A verbal/visual cues SLP Short Term Goal 4 (Week 1): Patient will follow 1-step commads with 50% accuracy given mod A verbal/visual cues SLP Short Term Goal 5 (Week 1): Patient will name objects with 50% accuracy given max A multimodal cues SLP Short Term Goal 6 (Week 1): Patient will orient x4 with max A multimodal cues  Skilled Therapeutic Interventions: Pt seen for skilled ST with focus on cognitive linguistic goals, pt in bed and rolls eyes when SLP enters room to introduce self. Pt then pulls blanket over his head and minimally responds to SLP interactions. Blanket remained over head and face for remainder of session despite encouragement and education. When patient does respond, responses are very delayed, 2-3 words and at times aggressive in tone. Pt eventually answering simple yes/no questions related to basic wants/needs and care with 75% accuracy. Per chart, pt with complaints yesterday to family that he was very hungry. Throughout session, SLP offered patient multiple trials and textures to attempt to assess PO intake however patient adamantly refused  any intake at this time stating "I've already had that" and denying hunger or thirst. Pt asking simple, appropriate questions related to family members and schedule this date which SLP answered. At this point, pt requesting SLP to leave and not engaging further. Pt missed 31 minutes of skilled ST this date due to unwilling to participate. Cont ST POC.   Pain Pain Assessment Pain Scale: 0-10 Pain Score: 0-No pain  Therapy/Group: Individual Therapy  Dewaine Conger 09/03/2022, 8:58 AM

## 2022-09-03 NOTE — Progress Notes (Signed)
Hypoglycemic Event  CBG: 67  Treatment: 4 oz juice/soda  Symptoms: None  Follow-up CBG: Time: 0650 CBG Result: 72  Possible Reasons for Event: Unknown  Comments/MD notified:  Glucose is 67. Patient asymptomatic. Hypoglycemia protocol followed. Repeat glucose is 72. Dr. Durel Salts made aware.    Melvin Taylor

## 2022-09-03 NOTE — Progress Notes (Signed)
Hypoglycemic Event  CBG: 59  Treatment: 4 oz juice/soda  Symptoms: None  Follow-up CBG: Time: 0636 CBG Result: 67  Possible Reasons for Event: Unknown  Comments/MD notified:  Blood glucose was 59, hypoglycemic protocol followed. Repeat glucose was 67. Hypoglycemic protocol repeated. Glucose now 72. Dr. Durel Salts made aware.    Melvin Taylor

## 2022-09-03 NOTE — Progress Notes (Addendum)
PROGRESS NOTE   Subjective/Complaints:  No acute complaints. Overnight, hypoglycemic episodes 60s, improved to 70s with hypoglycemia protocol. Patient had not eaten breakfast this AM d/t wanting to sleep. Discussed w/ nursing encourage PO intakes; patient not currently on standing insulin.      09/03/2022    1:02 PM 09/03/2022    3:16 AM 09/02/2022    7:56 PM  Vitals with BMI  Systolic 92 95 99  Diastolic 60 69 66  Pulse 55 54 63     LBM: 2/3, multiple   ROS- cannot obtain due to lethargy , poor attention  Objective:   DG Shoulder Right Port  Result Date: 09/01/2022 CLINICAL DATA:  Right shoulder pain. EXAM: RIGHT SHOULDER - 1 VIEW COMPARISON:  Right shoulder radiographs 12/15/2021 FINDINGS: Mild acromioclavicular joint space narrowing and peripheral osteophytosis, similar to prior. The glenohumeral joint space is maintained. No acute fracture or dislocation. The visualized portion of the right lung is unremarkable. Status post median sternotomy. Mild-to-moderate calcification within the aortic arch. IMPRESSION: Mild acromioclavicular osteoarthritis. Electronically Signed   By: Yvonne Kendall M.D.   On: 09/01/2022 15:08   Recent Labs    09/01/22 0613  WBC 5.9  HGB 10.1*  HCT 28.5*  PLT 175    Recent Labs    09/01/22 0613  NA 133*  K 4.6  CL 104  CO2 22  GLUCOSE 62*  BUN 12  CREATININE 1.03  CALCIUM 7.9*     Intake/Output Summary (Last 24 hours) at 09/03/2022 1428 Last data filed at 09/03/2022 0819 Gross per 24 hour  Intake 298 ml  Output 300 ml  Net -2 ml         Physical Exam: Vital Signs Blood pressure 92/60, pulse (!) 55, temperature 97.7 F (36.5 C), resp. rate 18, height '5\' 5"'$  (1.651 m), weight 53.2 kg, SpO2 100 %.   General: No acute distress. Lethargic but arousable to verbal stimuli.  Heart: Regular rate and rhythm no rubs murmurs or extra sounds Lungs: Clear to auscultation, breathing  unlabored, no rales or wheezes Abdomen: Positive bowel sounds, soft nontender to palpation, nondistended Extremities: No clubbing, cyanosis, or edema Skin: No evidence of breakdown, no evidence of rash Neurologic: Moving all 4 extremities in bed. Not answering orientation questions. Can follow simple commands.    Prior exams: Cranial nerves II through XII intact, motor strength is 4/5 in bilateral , bicep, tricep, grip, hip flexor, knee extensors, ankle dorsiflexor and plantar flexor, left delt RIght deltoid MMT limited by pain    Musculoskeletal:RIght shoulder pain limits abd and flexion    Assessment/Plan: 1. Functional deficits which require 3+ hours per day of interdisciplinary therapy in a comprehensive inpatient rehab setting. Physiatrist is providing close team supervision and 24 hour management of active medical problems listed below. Physiatrist and rehab team continue to assess barriers to discharge/monitor patient progress toward functional and medical goals  Care Tool:  Bathing    Body parts bathed by patient: Chest, Abdomen, Right upper leg, Left upper leg, Face   Body parts bathed by helper: Front perineal area, Buttocks, Left lower leg, Right lower leg, Left arm, Right arm     Bathing assist Assist  Level: Maximal Assistance - Patient 24 - 49%     Upper Body Dressing/Undressing Upper body dressing   What is the patient wearing?: Pull over shirt    Upper body assist Assist Level: Maximal Assistance - Patient 25 - 49%    Lower Body Dressing/Undressing Lower body dressing      What is the patient wearing?: Underwear/pull up, Pants     Lower body assist Assist for lower body dressing: Maximal Assistance - Patient 25 - 49%     Toileting Toileting    Toileting assist Assist for toileting: Dependent - Patient 0%     Transfers Chair/bed transfer  Transfers assist     Chair/bed transfer assist level: Moderate Assistance - Patient 50 - 74%      Locomotion Ambulation   Ambulation assist      Assist level: 2 helpers (+2 BHHA min assist) Assistive device: Hand held assist Max distance: 69f   Walk 10 feet activity   Assist     Assist level: 2 helpers Assistive device: Hand held assist   Walk 50 feet activity   Assist Walk 50 feet with 2 turns activity did not occur: Safety/medical concerns         Walk 150 feet activity   Assist Walk 150 feet activity did not occur: Safety/medical concerns         Walk 10 feet on uneven surface  activity   Assist Walk 10 feet on uneven surfaces activity did not occur: Safety/medical concerns         Wheelchair     Assist Is the patient using a wheelchair?: Yes (for transport) Type of Wheelchair: Manual    Wheelchair assist level: Dependent - Patient 0%      Wheelchair 50 feet with 2 turns activity    Assist        Assist Level: Dependent - Patient 0%   Wheelchair 150 feet activity     Assist      Assist Level: Dependent - Patient 0%   Blood pressure 92/60, pulse (!) 55, temperature 97.7 F (36.5 C), resp. rate 18, height '5\' 5"'$  (1.651 m), weight 53.2 kg, SpO2 100 %.  Medical Problem List and Plan: 1. Functional deficits secondary to acute metabolic encephalopathy.  Stable trace subdural hematoma along the posterior falx and posterior occipital lobe.  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  Received inpatient rehab services 12/21/2020 - 12/30/2020 after CVA             -patient may 16-18 days shower             -ELOS/Goals: PT/OT/SLP Supervision to MIN A             -Admit to CIR, evaluations tomorrow by PT OT and SLP 2.  Antithrombotics: -DVT/anticoagulation:  Mechanical:  Antiembolism stockings, knee (TED hose) Bilateral lower extremities             -antiplatelet therapy: Brilinta 90 mg twice daily 3. Pain Management: Tylenol as needed              RIght shoulder pain- Mild AC arthritis likely incidental finding   4. Mood/Behavior/Sleep: Provide emotional support.  Valium 5 mg daily Lexapro 20 mg daily, Depakote 500 mg twice daily             -antipsychotic agents: N/A 5. Neuropsych/cognition: This patient is not capable of making decisions on his own behalf. 6. Skin/Wound  Care: Routine skin checks 7. Fluids/Electrolytes/Nutrition: Routine in and outs with follow-up chemistries  - 2/3: Hypoglycemia, poor POs d/t lethargy. Labs ordered d/t 11 pt Na drop 1/29-2/1. Will likely need to start maintenance IVF NS 75/hr.   8.  Seizure disorder/epilepsy.  EEG negative.  Keppra 1000 mg every 12 hours, Depakote 500 mg twice a day  9.  Diabetes mellitus.  Currently SSI.  Patient on Lantus insulin 40 units nightly and Amaryl 2 mg daily prior to admission.  Resume as needed.  CBG (last 3)  Recent Labs    09/03/22 0636 09/03/22 0650 09/03/22 1120  GLUCAP 67* 72 106*     - 2/3: fall likely d/t poor POs overnight; not on standing insulin; eating 10-80% meals per chart.   10.  Hyperlipidemia.  Lipitor '80mg'$  daily 11.  CAD with CABG 2017.  Continue Imdur 30 mg daily.  No chest pain or shortness of breath. 12.  History of hypertension.  Patient on Norvasc 10 mg daily, Lopressor 12.5 mg twice daily, lisinopril 5 mg daily, BP well controlled overall 2/2 Vitals:   09/03/22 0316 09/03/22 1302  BP: 95/69 92/60  Pulse: (!) 54 (!) 55  Resp: 16 18  Temp: 98.5 F (36.9 C) 97.7 F (36.5 C)  SpO2: 100% 100%    2/3: Hypotensive, bradycardia, low intakes as above. Hold norvasc  13.  COVID-19.  Resolved.  Precautions have been discontinued    LOS: 3 days A FACE TO FACE EVALUATION WAS PERFORMED  Gertie Gowda 09/03/2022, 2:28 PM      Latest Ref Rng & Units 09/03/2022    2:52 PM 09/01/2022    6:13 AM 08/29/2022    3:46 AM  BMP  Glucose 70 - 99 mg/dL 107  62  86   BUN 6 - 20 mg/dL '22  12  18   '$ Creatinine 0.61 - 1.24 mg/dL 1.64  1.03  0.98    Sodium 135 - 145 mmol/L 133  133  144   Potassium 3.5 - 5.1 mmol/L 5.7  4.6  3.3   Chloride 98 - 111 mmol/L 102  104  110   CO2 22 - 32 mmol/L '26  22  25   '$ Calcium 8.9 - 10.3 mg/dL 7.9  7.9  8.4     BMP as above. Start IVF 75 cc/hr for 1 L total. Repeat labs in AM. Lisinopril Dced d/t K increase 5.7

## 2022-09-04 DIAGNOSIS — G9341 Metabolic encephalopathy: Secondary | ICD-10-CM | POA: Diagnosis not present

## 2022-09-04 LAB — BASIC METABOLIC PANEL
Anion gap: 5 (ref 5–15)
BUN: 23 mg/dL — ABNORMAL HIGH (ref 6–20)
CO2: 22 mmol/L (ref 22–32)
Calcium: 7.8 mg/dL — ABNORMAL LOW (ref 8.9–10.3)
Chloride: 107 mmol/L (ref 98–111)
Creatinine, Ser: 1.53 mg/dL — ABNORMAL HIGH (ref 0.61–1.24)
GFR, Estimated: 52 mL/min — ABNORMAL LOW (ref >60)
Glucose, Bld: 95 mg/dL (ref 70–99)
Potassium: 5.5 mmol/L — ABNORMAL HIGH (ref 3.5–5.1)
Sodium: 134 mmol/L — ABNORMAL LOW (ref 135–145)

## 2022-09-04 LAB — CBC
HCT: 21.3 % — ABNORMAL LOW (ref 39.0–52.0)
Hemoglobin: 7.6 g/dL — ABNORMAL LOW (ref 13.0–17.0)
MCH: 31.7 pg (ref 26.0–34.0)
MCHC: 35.7 g/dL (ref 30.0–36.0)
MCV: 88.8 fL (ref 80.0–100.0)
Platelets: 183 K/uL (ref 150–400)
RBC: 2.4 MIL/uL — ABNORMAL LOW (ref 4.22–5.81)
RDW: 15.7 % — ABNORMAL HIGH (ref 11.5–15.5)
WBC: 4.6 K/uL (ref 4.0–10.5)
nRBC: 0 % (ref 0.0–0.2)

## 2022-09-04 LAB — GLUCOSE, CAPILLARY
Glucose-Capillary: 95 mg/dL (ref 70–99)
Glucose-Capillary: 67 mg/dL — ABNORMAL LOW (ref 70–99)
Glucose-Capillary: 65 mg/dL — ABNORMAL LOW (ref 70–99)
Glucose-Capillary: 96 mg/dL (ref 70–99)
Glucose-Capillary: 138 mg/dL — ABNORMAL HIGH (ref 70–99)
Glucose-Capillary: 117 mg/dL — ABNORMAL HIGH (ref 70–99)
Glucose-Capillary: 169 mg/dL — ABNORMAL HIGH (ref 70–99)

## 2022-09-04 MED ORDER — DEXTROSE-NACL 5-0.45 % IV SOLN: INTRAVENOUS | Status: AC

## 2022-09-04 MED ORDER — SODIUM CHLORIDE 0.9 % IV SOLN: INTRAVENOUS | Status: DC

## 2022-09-04 NOTE — Progress Notes (Signed)
PROGRESS NOTE   Subjective/Complaints:  Hypoglycemic again this AM, patient remained in 60-70s despite multiple juices consumed. Appears per chart review to be eating meals. Not on carb modified diet, and not on standing DM treatment, just SSI. ?why continued hypoglycemic AM events and daytime BG remains 90s.   S/p IVF, NA, K, and BUN/Cr mildly improved.   This am, patient endorses no complaints. He has legs over the wide of the bed and is waiting for nursing to get him to the bathroom, not redirectable. Patient denies nausea, poor appetite.       09/04/2022    1:41 PM 09/04/2022    9:18 AM 09/04/2022    4:17 AM  Vitals with BMI  Systolic 99 916 97  Diastolic 63 78 80  Pulse 55  58    ROS- cannot obtain due to cognitive deficits  Objective:   No results found. Recent Labs    09/04/22 0707  WBC 4.6  HGB 7.6*  HCT 21.3*  PLT 183    Recent Labs    09/03/22 1452 09/04/22 0707  NA 133* 134*  K 5.7* 5.5*  CL 102 107  CO2 26 22  GLUCOSE 107* 95  BUN 22* 23*  CREATININE 1.64* 1.53*  CALCIUM 7.9* 7.8*     Intake/Output Summary (Last 24 hours) at 09/04/2022 1854 Last data filed at 09/04/2022 1823 Gross per 24 hour  Intake 596 ml  Output 250 ml  Net 346 ml         Physical Exam: Vital Signs Blood pressure 99/63, pulse (!) 55, temperature (!) 97.4 F (36.3 C), temperature source Oral, resp. rate 16, height '5\' 5"'$  (1.651 m), weight 53.2 kg, SpO2 100 %.   General: No acute distress. Appropriate appearance.   Heart: Regular rate and rhythm no rubs murmurs or extra sounds Lungs: Clear to auscultation, breathing unlabored, no rales or wheezes Abdomen: Positive bowel sounds, soft nontender to palpation, nondistended Extremities: No clubbing, cyanosis, or edema Skin: No evidence of breakdown, no evidence of rash Neurologic: Moving all 4 extremities in bed, antigravity and against resistance. Oriented to self and  year with cues. Place as "home".  Can follow simple commands.  Psych: Mildly impulsive, does not redirect to get back into bed, perseverates on using bathroom.    Prior exams: Cranial nerves II through XII intact, motor strength is 4/5 in bilateral , bicep, tricep, grip, hip flexor, knee extensors, ankle dorsiflexor and plantar flexor, left delt RIght deltoid MMT limited by pain    Musculoskeletal:RIght shoulder pain limits abd and flexion    Assessment/Plan: 1. Functional deficits which require 3+ hours per day of interdisciplinary therapy in a comprehensive inpatient rehab setting. Physiatrist is providing close team supervision and 24 hour management of active medical problems listed below. Physiatrist and rehab team continue to assess barriers to discharge/monitor patient progress toward functional and medical goals  Care Tool:  Bathing    Body parts bathed by patient: Chest, Abdomen, Right upper leg, Left upper leg, Face   Body parts bathed by helper: Front perineal area, Buttocks, Left lower leg, Right lower leg, Left arm, Right arm     Bathing assist Assist Level: Maximal  Assistance - Patient 24 - 49%     Upper Body Dressing/Undressing Upper body dressing   What is the patient wearing?: Pull over shirt    Upper body assist Assist Level: Maximal Assistance - Patient 25 - 49%    Lower Body Dressing/Undressing Lower body dressing      What is the patient wearing?: Underwear/pull up, Pants     Lower body assist Assist for lower body dressing: Maximal Assistance - Patient 25 - 49%     Toileting Toileting    Toileting assist Assist for toileting: Dependent - Patient 0%     Transfers Chair/bed transfer  Transfers assist     Chair/bed transfer assist level: Moderate Assistance - Patient 50 - 74%     Locomotion Ambulation   Ambulation assist      Assist level: 2 helpers (+2 BHHA min assist) Assistive device: Hand held assist Max distance: 53f    Walk 10 feet activity   Assist     Assist level: 2 helpers Assistive device: Hand held assist   Walk 50 feet activity   Assist Walk 50 feet with 2 turns activity did not occur: Safety/medical concerns         Walk 150 feet activity   Assist Walk 150 feet activity did not occur: Safety/medical concerns         Walk 10 feet on uneven surface  activity   Assist Walk 10 feet on uneven surfaces activity did not occur: Safety/medical concerns         Wheelchair     Assist Is the patient using a wheelchair?: Yes (for transport) Type of Wheelchair: Manual    Wheelchair assist level: Dependent - Patient 0%      Wheelchair 50 feet with 2 turns activity    Assist        Assist Level: Dependent - Patient 0%   Wheelchair 150 feet activity     Assist      Assist Level: Dependent - Patient 0%   Blood pressure 99/63, pulse (!) 55, temperature (!) 97.4 F (36.3 C), temperature source Oral, resp. rate 16, height '5\' 5"'$  (1.651 m), weight 53.2 kg, SpO2 100 %.  Medical Problem List and Plan: 1. Functional deficits secondary to acute metabolic encephalopathy.  Stable trace subdural hematoma along the posterior falx and posterior occipital lobe.  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  Received inpatient rehab services 12/21/2020 - 12/30/2020 after CVA             -patient may 16-18 days shower             -ELOS/Goals: PT/OT/SLP Supervision to MIN A             -Admit to CIR, evaluations tomorrow by PT OT and SLP 2.  Antithrombotics: -DVT/anticoagulation:  Mechanical:  Antiembolism stockings, knee (TED hose) Bilateral lower extremities             -antiplatelet therapy: Brilinta 90 mg twice daily 3. 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 Mood/Behavior/Sleep: Provide emotional support.  Valium 5  mg daily Lexapro 20 mg daily, Depakote 500 mg twice daily             -antipsychotic agents: N/A 5. Neuropsych/cognition: This patient is not capable of making decisions on his own behalf.  6. Skin/Wound Care: Routine skin checks 7. Fluids/Electrolytes/Nutrition: Routine in and outs with follow-up chemistries  - 2/3: Hypoglycemia, poor POs d/t lethargy. Labs ordered d/t 11 pt Na drop 1/29-2/1. Will likely need to start maintenance IVF NS 75/hr.  -2/4: See BMP results above; Na 133->134, K 5.7->5.5, and Cr 1.64->1.53 with 1 L IVF yesterday. Continue today, due to hypoglycemia switching to D5 1/2NS 1L overnight. Encouraged PO fluids. AM labs.  - Calorie counts, dietary consult placed to see if  diet can be further optimized to reduce hypoglycemic events  8.  Seizure disorder/epilepsy.  EEG negative.  Keppra 1000 mg every 12 hours, Depakote 500 mg twice a day  9.  Diabetes mellitus.  Currently SSI.  Patient on Lantus insulin 40 units nightly and Amaryl 2 mg daily prior to admission.  Resume as needed.  CBG (last 3)  Recent Labs    09/04/22 0720 09/04/22 1126 09/04/22 1653  GLUCAP 96 138* 117*     - 2/3: fall likely d/t poor POs overnight; not on standing insulin; eating 10-80% meals per chart.  - 2/4: Improved BG with D5 1/2 NS  10.  Hyperlipidemia.  Lipitor '80mg'$  daily 11.  CAD with CABG 2017.  Continue Imdur 30 mg daily.  No chest pain or shortness of breath. 12.  History of hypertension.  Patient on Norvasc 10 mg daily, Lopressor 12.5 mg twice daily, lisinopril 5 mg daily, BP well controlled overall 2/2 Vitals:   09/04/22 0918 09/04/22 1341  BP: 104/78 99/63  Pulse:  (!) 55  Resp:  16  Temp:  (!) 97.4 F (36.3 C)  SpO2:  100%    2/3: Hypotensive, bradycardia, low intakes as above. Hold norvasc 2/4: Losartan Dced d/t hyperkalemia, AKI. Remains hypotensive today; may need to adjust CHF medications next if symptomatic.   13.  COVID-19.  Resolved.  Precautions have been  discontinued    LOS: 4 days A FACE TO [place] 09/04/2022, 6:54 PM

## 2022-09-04 NOTE — Progress Notes (Signed)
Hypoglycemic Event  CBG: 65  Treatment: 4 oz juice/soda  Symptoms: None  Follow-up CBG: Time:0720 CBG Result:96  Possible Reasons for Event: Unknown  Comments/MD notified:Will notify MD Tressa Busman through secure chat    Meera Vasco, Warnell Bureau

## 2022-09-04 NOTE — Progress Notes (Signed)
Hypoglycemic Event  CBG: 67   Treatment: 4 oz juice/soda  Symptoms: None  Follow-up CBG: [id number] CBG Result:65  Possible Reasons for Event: Unknown  Comments/MD notified:will treat x2    Matei Magnone, Warnell Bureau

## 2022-09-05 DIAGNOSIS — E871 Hypo-osmolality and hyponatremia: Secondary | ICD-10-CM

## 2022-09-05 DIAGNOSIS — E875 Hyperkalemia: Secondary | ICD-10-CM

## 2022-09-05 DIAGNOSIS — N179 Acute kidney failure, unspecified: Secondary | ICD-10-CM

## 2022-09-05 LAB — GLUCOSE, CAPILLARY
Glucose-Capillary: 69 mg/dL — ABNORMAL LOW (ref 70–99)
Glucose-Capillary: 82 mg/dL (ref 70–99)
Glucose-Capillary: 85 mg/dL (ref 70–99)
Glucose-Capillary: 114 mg/dL — ABNORMAL HIGH (ref 70–99)
Glucose-Capillary: 168 mg/dL — ABNORMAL HIGH (ref 70–99)

## 2022-09-05 LAB — BASIC METABOLIC PANEL
Anion gap: 5 (ref 5–15)
BUN: 20 mg/dL (ref 6–20)
CO2: 22 mmol/L (ref 22–32)
Calcium: 8.1 mg/dL — ABNORMAL LOW (ref 8.9–10.3)
Chloride: 102 mmol/L (ref 98–111)
Creatinine, Ser: 1.4 mg/dL — ABNORMAL HIGH (ref 0.61–1.24)
GFR, Estimated: 58 mL/min — ABNORMAL LOW (ref >60)
Glucose, Bld: 92 mg/dL (ref 70–99)
Potassium: 5.8 mmol/L — ABNORMAL HIGH (ref 3.5–5.1)
Sodium: 129 mmol/L — ABNORMAL LOW (ref 135–145)

## 2022-09-05 MED ORDER — ENSURE ENLIVE PO LIQD
237.0000 mL | Freq: Three times a day (TID) | ORAL | Status: DC
  Administered 2022-09-05 – 2022-09-06 (×2): 237 mL via ORAL

## 2022-09-05 MED ORDER — SODIUM ZIRCONIUM CYCLOSILICATE 10 G PO PACK
10.0000 g | PACK | Freq: Once | ORAL | Status: AC
  Administered 2022-09-05: 10 g via ORAL
  Filled 2022-09-05: qty 1

## 2022-09-05 MED ORDER — DEXTROSE-NACL 5-0.9 % IV SOLN: INTRAVENOUS | Status: DC

## 2022-09-05 NOTE — Progress Notes (Signed)
PROGRESS NOTE   Subjective/Complaints:  Na down today and K+ up to 5.8. Glucose was still decreased at 69 this AM.  No additional concerns.       09/05/2022    4:34 AM 09/04/2022    7:19 PM 09/04/2022    1:41 PM  Vitals with BMI  Systolic 093 235 99  Diastolic 70 66 63  Pulse 56 61 55    ROS- cannot obtain due to cognitive deficits  Objective:   No results found. Recent Labs    09/04/22 0707  WBC 4.6  HGB 7.6*  HCT 21.3*  PLT 183   Recent Labs    09/04/22 0707 09/05/22 0907  NA 134* 129*  K 5.5* 5.8*  CL 107 102  CO2 22 22  GLUCOSE 95 92  BUN 23* 20  CREATININE 1.53* 1.40*  CALCIUM 7.8* 8.1*    Intake/Output Summary (Last 24 hours) at 09/05/2022 0843 Last data filed at 09/05/2022 5732 Gross per 24 hour  Intake 608 ml  Output --  Net 608 ml         Physical Exam: Vital Signs Blood pressure 112/70, pulse (!) 56, temperature 98.1 F (36.7 C), temperature source Oral, resp. rate 18, height '5\' 5"'$  (1.651 m), weight 53.2 kg, SpO2 100 %.   General: No acute distress. Appropriate appearance.   Heart: Regular rate and rhythm no rubs murmurs or extra sounds Lungs: Clear to auscultation, breathing unlabored, no rales or wheezes Abdomen: Positive bowel sounds, soft nontender to palpation, nondistended Extremities: No clubbing, cyanosis, or edema Skin: No evidence of breakdown, no evidence of rash Neurologic: Moving all 4 extremities in bed, antigravity and against resistance. Oriented to self and year with cues. Place as "home".  Can follow simple commands.  Psych: Mildly impulsive, flat affect   Prior exams: Cranial nerves II through XII intact, motor strength is 4/5 in bilateral , bicep, tricep, grip, hip flexor, knee extensors, ankle dorsiflexor and plantar flexor, left delt RIght deltoid MMT limited by pain    Musculoskeletal:RIght shoulder pain limits abd and flexion    Assessment/Plan: 1.  Functional deficits which require 3+ hours per day of interdisciplinary therapy in a comprehensive inpatient rehab setting. Physiatrist is providing close team supervision and 24 hour management of active medical problems listed below. Physiatrist and rehab team continue to assess barriers to discharge/monitor patient progress toward functional and medical goals  Care Tool:  Bathing    Body parts bathed by patient: Chest, Abdomen, Right upper leg, Left upper leg, Face   Body parts bathed by helper: Front perineal area, Buttocks, Left lower leg, Right lower leg, Left arm, Right arm     Bathing assist Assist Level: Maximal Assistance - Patient 24 - 49%     Upper Body Dressing/Undressing Upper body dressing   What is the patient wearing?: Pull over shirt    Upper body assist Assist Level: Maximal Assistance - Patient 25 - 49%    Lower Body Dressing/Undressing Lower body dressing      What is the patient wearing?: Underwear/pull up, Pants     Lower body assist Assist for lower body dressing: Maximal Assistance - Patient 25 - 49%  Toileting Toileting    Toileting assist Assist for toileting: Dependent - Patient 0%     Transfers Chair/bed transfer  Transfers assist     Chair/bed transfer assist level: Moderate Assistance - Patient 50 - 74%     Locomotion Ambulation   Ambulation assist      Assist level: 2 helpers (+2 BHHA min assist) Assistive device: Hand held assist Max distance: 7f   Walk 10 feet activity   Assist     Assist level: 2 helpers Assistive device: Hand held assist   Walk 50 feet activity   Assist Walk 50 feet with 2 turns activity did not occur: Safety/medical concerns         Walk 150 feet activity   Assist Walk 150 feet activity did not occur: Safety/medical concerns         Walk 10 feet on uneven surface  activity   Assist Walk 10 feet on uneven surfaces activity did not occur: Safety/medical concerns          Wheelchair     Assist Is the patient using a wheelchair?: Yes (for transport) Type of Wheelchair: Manual    Wheelchair assist level: Dependent - Patient 0%      Wheelchair 50 feet with 2 turns activity    Assist        Assist Level: Dependent - Patient 0%   Wheelchair 150 feet activity     Assist      Assist Level: Dependent - Patient 0%   Blood pressure 112/70, pulse (!) 56, temperature 98.1 F (36.7 C), temperature source Oral, resp. rate 18, height '5\' 5"'$  (1.651 m), weight 53.2 kg, SpO2 100 %.  Medical Problem List and Plan: 1. Functional deficits secondary to acute metabolic encephalopathy.  Stable trace subdural hematoma along the posterior falx and posterior occipital lobe.  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  Received inpatient rehab services 12/21/2020 - 12/30/2020 after CVA             -patient may 16-18 days shower             -ELOS/Goals: PT/OT/SLP Supervision to MIN A             -Admit to CIR, evaluations tomorrow by PT OT and SLP 2.  Antithrombotics: -DVT/anticoagulation:  Mechanical:  Antiembolism stockings, knee (TED hose) Bilateral lower extremities             -antiplatelet therapy: Brilinta 90 mg twice daily 3. 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 Mood/Behavior/Sleep: Provide emotional support.  Valium 5 mg daily Lexapro 20 mg daily, Depakote 500 mg twice daily             -antipsychotic agents: N/A 5. Neuropsych/cognition: This patient is not capable of making decisions on his own behalf. 6. Skin/Wound Care: Routine skin checks 7. Fluids/Electrolytes/Nutrition: Routine in and outs with follow-up chemistries  - 2/3: Hypoglycemia, poor POs d/t lethargy. Labs ordered d/t 11 pt Na drop 1/29-2/1. Will likely need to start maintenance IVF NS 75/hr.  -2/4: See BMP results above; Na  133->134, K 5.7->5.5, and Cr 1.64->1.53 with 1 L IVF yesterday. Continue today, due to hypoglycemia switching to D5 1/2NS 1L overnight. Encouraged PO fluids. AM labs.  - Calorie counts, dietary consult placed to see if  diet can  be further optimized to reduce hypoglycemic events 2/5 will order lokelma for K+ 5.8, give 10g lokelma  8.  Seizure disorder/epilepsy.  EEG negative.  Keppra 1000 mg every 12 hours, Depakote 500 mg twice a day  9.  Diabetes mellitus.  Currently SSI.  Patient on Lantus insulin 40 units nightly and Amaryl 2 mg daily prior to admission.  Resume as needed.  CBG (last 3)  Recent Labs    09/04/22 2031 09/05/22 0620 09/05/22 0710  GLUCAP 169* 69* 82     - 2/3: fall likely d/t poor POs overnight; not on standing insulin; eating 10-80% meals per chart.  - 2/4: Improved BG with D5 1/2 NS -2/5 start D5NS  10.  Hyperlipidemia.  Lipitor '80mg'$  daily 11.  CAD with CABG 2017.  Continue Imdur 30 mg daily.  No chest pain or shortness of breath. 12.  History of hypertension.  Patient on Norvasc 10 mg daily, Lopressor 12.5 mg twice daily, lisinopril 5 mg daily, BP 2/5 controlled Vitals:   09/04/22 1919 09/05/22 0434  BP: 112/66 112/70  Pulse: 61 (!) 56  Resp: 18 18  Temp: 98 F (36.7 C) 98.1 F (36.7 C)  SpO2: 100% 100%    2/3: Hypotensive, bradycardia, low intakes as above. Hold norvasc 2/4: Losartan Dced d/t hyperkalemia, AKI. Remains hypotensive today; may need to adjust CHF medications next if symptomatic.   13.  COVID-19.  Resolved.  Precautions have been discontinued 14. Hyponatremic, Na 129  -2/5, was given D5 1/2 NS last night, will start D5NS 41m/hr 15. AKI  -2/5 Start IVF    LOS: 5 days A FACE TO FACE EVALUATION WAS PERFORMED  YJennye Boroughs2/11/2022, 8:43 AM

## 2022-09-05 NOTE — Progress Notes (Signed)
Occupational Therapy Session Note  Patient Details  Name: Melvin Taylor MRN: [id number] Date of Birth: [birth year]  Today's Date: 09/05/2022 OT Individual Time: 1101-1210 OT Individual Time Calculation (min): 69 min  OT Individual Time: 2878-6767 OT Individual Time Calculation (min): 36 min   Short Term Goals: Week 1:  OT Short Term Goal 1 (Week 1): Pt will consistently complete toilet transfer min A sing LRAD OT Short Term Goal 2 (Week 1): Pt will utilize RUE during fucntional tasks ~50% of time with mod cueing OT Short Term Goal 3 (Week 1): Pt will maintain dynamic sitting balance CGA using LRAD OT Short Term Goal 4 (Week 1): Pt will sustain attention during BADL/functional task in quiet environment with mod cueing  Skilled Therapeutic Interventions/Progress Updates:     AM Session: Pt received sitting up in recliner lightly sleeping easy to wake. Pt reporting pain in back unrated- offered repositioning and rest breaks to accommodate for pain, Pt politely denying need to request pain medication from nursing. Pt continuing to present with flat affect and decreased processing speed requiring increased time to respond. Pt receptive to skilled OT session with moderate motivation required. Focus this session LB strengthening for BADLs and functional transfers.  TEDs, shoes, and air cast donned on Pt total A at beginning of session.   Sit>stand HHA mod A with cueing required for anterior weight shifting and body mechanics. Stand step>wc HHA with mod A to initiate stepping with pt presenting with posterior propulsion requiring posterior assist from OT to maintain balance. Pt noted to initiate weight shifting during step requiring mod A to fully facilitate weight shifting.   Pt transported total A to therapy gym in wc for time management and energy conservation. Stand pivot to EOM HHA with Mod A required for weight shifting. Pt educated on sit>supine transfer with mod A required to lift legs +time.  Pt completed LB bridges and hip adduction with medium-sized soft ball placed between Pt legs to increase LB strength for balance and BADLs. Pt required mod A and max cueing to lift bottom fully off mat during bridge. Pt able to adduct hips to squeeze ball 2x10 reps with max cues for technique and attention. Pt able to follow verbal cues for leg and positioning during supine>sit requiring mod A to bring legs off mat and bring trunk off mat using LUE to initiate lifting.   Pt performed stand step transfer to wc similar to previous transfer. Transported to room total A in wc. Stand pivot to recliner HHA mod A. Pt was left resting in recliner with call bell in reach, seat belt alarm on, and all needs met.   PM Session:  Pt received sitting in recliner lightly sleeping. Pt reporting no pain, however groaning when donning shoes with repositioning and rest breaks provided during session.   +2 available at beginning of session for functional mobility training. Pt completed sit>stand with mod cues for body mechanics and positioning. Ambulated to wc with mod HHA on L side +2 min HHA on R. Mod verbal and tactile cues required during task for step length, motivation, and to facilitate weight shifting. Pt transported total A to therapy gym in wc.   Functional mobility training to EOM mod HHA +2 min HHA with tactile and verbal cues required for step length, weight shifting, and trunk positioning. Vitals following ambulation 100/79 (85).   Pt completed seated functional reach activity to facilitate upright posture sitting EOM with dynamic sitting balance challenge. Pt able to don 4x  red (light resistance) clothes pins onto top of vertical bar using LUE with anterior weightlifting facilitated during task CGA. Task graded up to incorporate standing. Pt able to don clothes pines (red) x3 onto bar in standing with posterior trunk support provided d/t posterior lean with max cues required to facilitate anterior weight  shifting.   Stand step EOM>wc mod A with mod cues required. Pt transported back to room in wc total A. Completed stand step to EOB mod A and returned to bed with mod A to lift left with Pt able to follow verbal cues to lower trunk using LUE. Pt was left resting in bed with call bell in reach, bed alarm on, and all needs met.   Therapy Documentation Precautions:  Precautions Precautions: Fall, Other (comment) Precaution Comments: R hemiplegia with R UE flexor tone and R LE extensor tone (LE tone primarily in the bed), Aphasia, delayed processing Required Braces or Orthoses: Other Brace Other Brace: R UE aircast for tone management Restrictions Weight Bearing Restrictions: No  Pain: Pain Assessment Pain Scale: 0-10 Pain Score: 0-No pain   Therapy/Group: Individual Therapy  Janey Genta 09/05/2022, 11:08 AM

## 2022-09-05 NOTE — Progress Notes (Signed)
Initial Nutrition Assessment  DOCUMENTATION CODES:   Severe malnutrition in context of social or environmental circumstances  INTERVENTION:  Continue 48 hour calorie count as ordered by team. Calorie count was started 09/05/22 at breakfast. Updated order to add calorie count instructions, but RN has already started an envelope with receipts on patient's door. RD will follow for results of calorie count.  Discontinued Nepro per pt request.  RD suspects that in setting of severe malnutrition, pt may have decreased glycogen stores contributing to hypoglycemia in AM. Recommend providing supplements between meals and bedtime snack (see below).  Provide Ensure Enlive po TID between meals, each supplement provides 350 kcal and 20 grams of protein. Please thicken to appropriate consistency per SLP recommendations. Recommend strongly encouraging bedtime supplement of Ensure to see if this will help improve hypoglycemia pt has been experiencing in AM.  RD ordered bedtime snack to be delivered at 8PM. Strongly encouraged bedtime snack to see if this will help improve hypoglycemia pt has been experiencing in AM.  NUTRITION DIAGNOSIS:   Severe Malnutrition related to social / environmental circumstances as evidenced by severe fat depletion, severe muscle depletion.  GOAL:   Patient will meet greater than or equal to 90% of their needs  MONITOR:   PO intake, Supplement acceptance, Labs, Weight trends, I & O's  REASON FOR ASSESSMENT:   Consult Assessment of nutrition requirement/status  ASSESSMENT:   57 year old male with PMHx of DM type 2, CAD s/p CABG, gout, HTN, CVA, polysubstance abuse who is currently admitted to inpatient rehab with functional deficits secondary to acute metabolic encephalopathy.  RD consulted and calorie count started as pt with hypoglycemia despite good PO intake recorded.  Met with pt at bedside.  He is currently on dysphagia 2 diet with nectar-thick liquids per SLP  recommendations. He reports that his appetite is good.  He reports he is eating 3 meals per day. Unable to provide nutrition history from prior to admission. Per review of chart on 2/4 pt ate 95% of breakfast, 100% of lunch, and 90% of dinner. Calorie count was officially started this AM 09/05/22 with breakfast meal. RN has hunt envelope on door and receipts are being collected. Pt has been consistently having hypoglycemia in AM around 0600. Noted pt has been refusing Nepro supplements per MAR. Pt reports he does not like these supplements and prefers to try Ensure instead. Will change supplements so they are given between meals. Also discussed with pt and he is amenable to having a bedtime snack delivered each evening for him to eat. Suspect bedtime snack and evening Ensure after dinner may help prevent hypoglycemia pt has been experiencing each AM. RD will continue to follow for results of calorie count started this morning. Pt is edentulous. He reports he does not have any dentures.  Pt reports he is unsure of his UBW or if he has been losing weight. Per review eof chart weights have fluctuated between 52-59 kg but do appear to be overall trending down. Current wt is 53.2 kg (117.29 lbs).  Medications reviewed and include: allopurinol, ferrous sulfate 325 mg daily, Novolog 0-15 units TID, Imdur, Keppra, Lopressor, MVI daily, valproic acid, D5-NS at 50 mL/hour  Labs reviewed: CBG 69-169, Sodium 129, Potassium 5.8, Creatinine 1.4 Vitamin D: 90.2 08/05/22 Vitamin B12 897 01/26/22  UOP: 9 occurrences unmeasured UOP  I/O: +1428 mL since admission  Discussed with RN.  NUTRITION - FOCUSED PHYSICAL EXAM:  Flowsheet Row Most Recent Value  Orbital Region Moderate depletion  Upper Arm Region Severe depletion  Thoracic and Lumbar Region Severe depletion  Buccal Region Severe depletion  Temple Region Severe depletion  Clavicle Bone Region Severe depletion  Clavicle and Acromion Bone Region Severe depletion   Scapular Bone Region Moderate depletion  Dorsal Hand Severe depletion  Patellar Region Severe depletion  Anterior Thigh Region Severe depletion  Posterior Calf Region Severe depletion  Edema (RD Assessment) None  Hair Reviewed  Eyes Reviewed  Mouth Reviewed  [edentulous]  Skin Reviewed  Nails Reviewed       Diet Order:   Diet Order             DIET DYS 2 Room service appropriate? No; Fluid consistency: Nectar Thick  Diet effective now                  EDUCATION NEEDS:   Not appropriate for education at this time  Skin:  Skin Assessment: Reviewed RN Assessment  Last BM:  09/05/22 - large type 6  Height:   Ht Readings from Last 1 Encounters:  08/31/22 '5\' 5"'$  (1.651 m)   Weight:   Wt Readings from Last 1 Encounters:  08/31/22 53.2 kg   Ideal Body Weight:  61.8 kg  BMI:  Body mass index is 19.52 kg/m.  Estimated Nutritional Needs:   Kcal:  1800-2000  Protein:  90-100 grams  Fluid:  1.8-2 L/day  Loanne Drilling, MS, RD, LDN, CNSC Pager number available on Amion

## 2022-09-05 NOTE — Progress Notes (Signed)
Speech Language Pathology Daily Session Note  Patient Details  Name: Melvin Taylor MRN: [id number] Date of Birth: April 13, 1966  Today's Date: 09/05/2022 SLP Individual Time: 4827-0786 SLP Individual Time Calculation (min): 45 min  Short Term Goals: Week 1: SLP Short Term Goal 1 (Week 1): Patient will consume current diet with minimal overt s/sx of aspiration with min A for implementation of swallowing precautions/strategies SLP Short Term Goal 2 (Week 1): Patient will participate in further cognitive-linguistic, speech/language evaluation with 100% completion SLP Short Term Goal 3 (Week 1): Patient will respond to basic yes/no questions pertaining to environment and biographical information with 50% accuracy given mod A verbal/visual cues SLP Short Term Goal 4 (Week 1): Patient will follow 1-step commads with 50% accuracy given mod A verbal/visual cues SLP Short Term Goal 5 (Week 1): Patient will name objects with 50% accuracy given max A multimodal cues SLP Short Term Goal 6 (Week 1): Patient will orient x4 with max A multimodal cues  Skilled Therapeutic Interventions: Skilled ST treatment focused on swallowing and language goals. Pt was semi-reclined in bed and awake on arrival. Upon SLP entering, pt immediately turned his head away from therapist and closed his eyes. He was willing to open his eyes to participate, however required extensive coaxing and appeared to have a poor frustration tolerance. Pt only responded to yes/no questions during <50% of opportunities; did not respond to any open ended questions unless cued directly during structured language tasks. Pt required max A verbal cues to increase vocal intensity. Pt performed oral care using suction sponge and max A for thoroughness. Pt accepted limited, controlled therapeutic PO trials with ice chips and 1/4-1/2 tsp of thin water. Pt exhibited no overt s/sx of aspiration with ice chips; immediate cough response x1 and wet vocal quality x2  with 1/4-1/2 tsp of water via spoon. Pt exhibited suspected swallow initiation delay during 50% of occasions; suspect inconsistencies related to swallow timing attributed to fatigue. Between PO trials, SLP facilitated verbal expression tasks. Pt independently initiated first and last name, birthday, and age with 100% accuracy and mod A verbal cues to increase vocal intensity to improve intelligibility. Pt was then known to shout responses out of frustration. Pt eventually pulled covers over his head and exhibited minimal, meaningful participation. Patient was left in bed with alarm activated and immediate needs within reach at end of session. Passed off to nurse for med pass. Continue current diet with continuation of conservative and controlled PO trials. Continue per current plan of care.      Pain  None  Therapy/Group: Individual Therapy  Patty Sermons 09/05/2022, 8:41 AM

## 2022-09-05 NOTE — Progress Notes (Signed)
Physical Therapy Session Note  Patient Details  Name: Melvin Taylor MRN: [id number] Date of Birth: [birth year]  Today's Date: 09/05/2022 PT Individual Time: 6599-3570 PT Individual Time Calculation (min): 26 min   Short Term Goals: Week 1:  PT Short Term Goal 1 (Week 1): Pt will perform supine<>sit with CGA consistently PT Short Term Goal 2 (Week 1): Pt will perform sit<>stands using LRAD with CGA PT Short Term Goal 3 (Week 1): Pt will perform bed<>chair transfers using LRAD with CGA PT Short Term Goal 4 (Week 1): Pt will ambulate at least 44f using LRAD with CGA  Skilled Therapeutic Interventions/Progress Updates:  Patient supine in bed with covers pulled over head upon entrance to room. Patient is awake, alert, and agreeable to PT session.   Patient with no pain complaint at start of session. Complains of lack of sleep at night.   Low to respond and initiate movements throughout session.   Therapeutic Activity: Bed Mobility: Pt performed supine --> sit with initiation of BLE with supervision and consistent vc for sequencing technique. Bu then requires MinA to bring UB to upright seated position on EOB. ModA to scoot forward ot place feet on floor.   Sitting balance challenged with dressing task while seated EOB. Does begin to lose balance posteriorly but stops with abdominal activation and then requires MinA to lean forward.   Transfers: Pt performed sit<>stand and stand pivot transfers throughout session with ***. Provided verbal cues for***.  Gait Training:  Pt ambulated *** ft using *** with ***. Demonstrated ***. Provided vc/ tc for ***.  Wheelchair Mobility:  Pt propelled wheelchair *** feet with ***. Provided vc for ***.  Neuromuscular Re-ed: NMR facilitated during session with focus on***. Pt guided in ***. NMR performed for improvements in motor control and coordination, balance, sequencing, judgement, and self confidence/ efficacy in performing all aspects of mobility  at highest level of independence.   Therapeutic Exercise: Pt performed the following exercises with vc/ tc for proper technique. ***  Patient *** at end of session with brakes locked, *** alarm set, and all needs within reach.  - Dressing with [place] w/c with ModA for RLE bkwd pivot stepping - SPVT to recliner with improved following directions for sequencing step and MinA for facilitating weight shift and ModA for bkwd step placement.  Therapy Documentation Precautions:  Precautions Precautions: Fall, Other (comment) Precaution Comments: R hemiplegia with R UE flexor tone and R LE extensor tone (LE tone primarily in the bed), Aphasia, delayed processing Required Braces or Orthoses: Other Brace Other Brace: R UE aircast for tone management Restrictions Weight Bearing Restrictions: No General:   Vital Signs:  Pain:  No pain complaint this session.   Therapy/Group: Individual Therapy  JAlger SimonsPT, DPT, CSRS 09/05/2022, 10:19 AM

## 2022-09-05 NOTE — Progress Notes (Signed)
Hypoglycemic Event  CBG: 69  Treatment: 4 oz juice/soda  Symptoms: None  Follow-up CBG: Time:0710 CBG Result:82  Possible Reasons for Event: Unknown  Comments/MD notified: Treated x 1 per protocol. Will secure chat MD Kirsteins and notified oncoming RN    Melvin Taylor, [place]

## 2022-09-06 LAB — BASIC METABOLIC PANEL
Anion gap: 9 (ref 5–15)
Anion gap: 4 — ABNORMAL LOW (ref 5–15)
BUN: 18 mg/dL (ref 6–20)
BUN: 19 mg/dL (ref 6–20)
CO2: 23 mmol/L (ref 22–32)
CO2: 23 mmol/L (ref 22–32)
Calcium: 8.7 mg/dL — ABNORMAL LOW (ref 8.9–10.3)
Calcium: 8.3 mg/dL — ABNORMAL LOW (ref 8.9–10.3)
Chloride: 105 mmol/L (ref 98–111)
Chloride: 108 mmol/L (ref 98–111)
Creatinine, Ser: 1.32 mg/dL — ABNORMAL HIGH (ref 0.61–1.24)
Creatinine, Ser: 1.22 mg/dL (ref 0.61–1.24)
GFR, Estimated: >60 mL/min (ref >60)
GFR, Estimated: >60 mL/min (ref >60)
Glucose, Bld: 80 mg/dL (ref 70–99)
Glucose, Bld: 79 mg/dL (ref 70–99)
Potassium: 6.1 mmol/L — ABNORMAL HIGH (ref 3.5–5.1)
Potassium: 4.6 mmol/L (ref 3.5–5.1)
Sodium: 135 mmol/L (ref 135–145)
Sodium: 135 mmol/L (ref 135–145)

## 2022-09-06 LAB — GLUCOSE, CAPILLARY
Glucose-Capillary: 77 mg/dL (ref 70–99)
Glucose-Capillary: 63 mg/dL — ABNORMAL LOW (ref 70–99)
Glucose-Capillary: 79 mg/dL (ref 70–99)
Glucose-Capillary: 164 mg/dL — ABNORMAL HIGH (ref 70–99)
Glucose-Capillary: 126 mg/dL — ABNORMAL HIGH (ref 70–99)

## 2022-09-06 MED ORDER — SODIUM ZIRCONIUM CYCLOSILICATE 10 G PO PACK
10.0000 g | PACK | Freq: Three times a day (TID) | ORAL | Status: DC
  Administered 2022-09-06 (×2): 10 g via ORAL
  Filled 2022-09-06 (×3): qty 1

## 2022-09-06 MED ORDER — LEVETIRACETAM 100 MG/ML PO SOLN
1000.0000 mg | Freq: Two times a day (BID) | ORAL | Status: DC
  Administered 2022-09-06 – 2022-09-08 (×5): 1000 mg via ORAL
  Filled 2022-09-06 (×5): qty 10

## 2022-09-06 MED ORDER — NEPRO/CARBSTEADY PO LIQD
237.0000 mL | Freq: Three times a day (TID) | ORAL | Status: DC
  Administered 2022-09-06 – 2022-09-08 (×7): 237 mL via ORAL

## 2022-09-06 NOTE — Progress Notes (Signed)
Calorie Count Note  48 hour calorie count ordered. Day 1 results below:  Diet: dysphagia 2 with nectar-thick liquids Supplements: Ensure Enlive TID between meals (thickened to appropriate consistency) Snack: QHS snack  Breakfast 2/5: 90% eggs, 90% pork sausage patty, 100% applesauce, 10% nectar-thick cranberry juice (347 kcal, 15 grams of protein) Lunch 2/5: 25% chicken, 10% puree peaches, 10% nectar-thick sweet tea, 100% Magic Cup (337 kcal, 16 grams of protein) Dinner 2/5: 75% chicken with gravy, 100% mashed potatoes, 100% pureed mixed berries, 50% nectar-thick sweet tea, 100% Magic Cup (621 kcal, 26 grams of protein) Snack 2/5: N/A (pt reports he did not receive) Supplements: 1 bottle Nepro, 1 bottle Ensure Enlive (770 kcal, 39 grams of protein)  Total intake day 1: 2075 kcal (>100% of estimated needs)  96 grams of protein (100% of estimated needs)  Estimated Nutritional Needs:  Kcal:  1800-2000 Protein:  90-100 grams Fluid:  1.8-2 L/day  Nutrition Dx: Severe Malnutrition related to social / environmental circumstances as evidenced by severe fat depletion, severe muscle depletion.   Goal: Patient will meet greater than or equal to 90% of their needs   Intervention:  -Continue 48 hour calorie count. -Continue Ensure Enlive po TID between meals, each supplement provides 350 kcal and 20 grams of protein. Please thicken to appropriate consistency per SLP recommendations. -Continue bedtime snack to be delivered at 8PM.  Strongly encourage bedtime Ensure supplement and snack to help with preventing hypoglycemia in AM.  Noted CBG this AM at 0541 was 77 (WNL).  Loanne Drilling, MS, RD, LDN, CNSC Pager number available on Amion

## 2022-09-06 NOTE — Progress Notes (Addendum)
PROGRESS NOTE   Subjective/Complaints:  K+ continues to be elevated. No new concerns.       09/06/2022    5:53 AM 09/05/2022    7:30 PM 09/05/2022   12:35 PM  Vitals with BMI  Systolic 803 212 248  Diastolic 64 60 74  Pulse 57 63 53    ROS- cannot obtain due to cognitive deficits  Objective:   No results found. Recent Labs    09/04/22 0707  WBC 4.6  HGB 7.6*  HCT 21.3*  PLT 183    Recent Labs    09/04/22 0707 09/05/22 0907  NA 134* 129*  K 5.5* 5.8*  CL 107 102  CO2 22 22  GLUCOSE 95 92  BUN 23* 20  CREATININE 1.53* 1.40*  CALCIUM 7.8* 8.1*     Intake/Output Summary (Last 24 hours) at 09/06/2022 0820 Last data filed at 09/06/2022 0758 Gross per 24 hour  Intake 130 ml  Output 300 ml  Net -170 ml         Physical Exam: Vital Signs Blood pressure 104/64, pulse (!) 57, temperature 98.2 F (36.8 C), temperature source Oral, resp. rate 18, height '5\' 5"'$  (1.651 m), weight 53.2 kg, SpO2 100 %.   General: No acute distress. Lying in bed with blanket covering his face Heart: Regular rate and rhythm no rubs murmurs or extra sounds Lungs: Clear to auscultation, breathing unlabored, no rales or wheezes Abdomen: Positive bowel sounds, soft nontender to palpation, nondistended Extremities: No clubbing, cyanosis, or edema Skin: No evidence of breakdown, no evidence of rash Neurologic: Moving all 4 extremities in bed, antigravity and against resistance. Oriented to self and year with cues. Place as "home".  Can follow simple commands.  Psych: Mildly impulsive, flat affect   Prior exams: Cranial nerves II through XII intact, motor strength is 4/5 in bilateral , bicep, tricep, grip, hip flexor, knee extensors, ankle dorsiflexor and plantar flexor, left delt RIght deltoid MMT limited by pain    Musculoskeletal:RIght shoulder pain limits abd and flexion    Assessment/Plan: 1. Functional deficits which require  3+ hours per day of interdisciplinary therapy in a comprehensive inpatient rehab setting. Physiatrist is providing close team supervision and 24 hour management of active medical problems listed below. Physiatrist and rehab team continue to assess barriers to discharge/monitor patient progress toward functional and medical goals  Care Tool:  Bathing    Body parts bathed by patient: Chest, Abdomen, Right upper leg, Left upper leg, Face   Body parts bathed by helper: Front perineal area, Buttocks, Left lower leg, Right lower leg, Left arm, Right arm     Bathing assist Assist Level: Maximal Assistance - Patient 24 - 49%     Upper Body Dressing/Undressing Upper body dressing   What is the patient wearing?: Pull over shirt    Upper body assist Assist Level: Maximal Assistance - Patient 25 - 49%    Lower Body Dressing/Undressing Lower body dressing      What is the patient wearing?: Underwear/pull up, Pants     Lower body assist Assist for lower body dressing: Maximal Assistance - Patient 25 - 49%     Toileting Toileting  Toileting assist Assist for toileting: Dependent - Patient 0%     Transfers Chair/bed transfer  Transfers assist     Chair/bed transfer assist level: Moderate Assistance - Patient 50 - 74%     Locomotion Ambulation   Ambulation assist      Assist level: 2 helpers (+2 BHHA min assist) Assistive device: Hand held assist Max distance: 88f   Walk 10 feet activity   Assist     Assist level: 2 helpers Assistive device: Hand held assist   Walk 50 feet activity   Assist Walk 50 feet with 2 turns activity did not occur: Safety/medical concerns         Walk 150 feet activity   Assist Walk 150 feet activity did not occur: Safety/medical concerns         Walk 10 feet on uneven surface  activity   Assist Walk 10 feet on uneven surfaces activity did not occur: Safety/medical concerns         Wheelchair     Assist Is  the patient using a wheelchair?: Yes (for transport) Type of Wheelchair: Manual    Wheelchair assist level: Dependent - Patient 0%      Wheelchair 50 feet with 2 turns activity    Assist        Assist Level: Dependent - Patient 0%   Wheelchair 150 feet activity     Assist      Assist Level: Dependent - Patient 0%   Blood pressure 104/64, pulse (!) 57, temperature 98.2 F (36.8 C), temperature source Oral, resp. rate 18, height '5\' 5"'$  (1.651 m), weight 53.2 kg, SpO2 100 %.  Medical Problem List and Plan: 1. Functional deficits secondary to acute metabolic encephalopathy.  Stable trace subdural hematoma along the posterior falx and posterior occipital lobe.  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  Received inpatient rehab services 12/21/2020 - 12/30/2020 after CVA             -patient may 16-18 days shower             -ELOS/Goals: PT/OT/SLP Supervision to MIN A             -Continue CIR,  PT OT and SLP  -Team conference tomorrow 2.  Antithrombotics: -DVT/anticoagulation:  Mechanical:  Antiembolism stockings, knee (TED hose) Bilateral lower extremities             -antiplatelet therapy: Brilinta 90 mg twice daily 3. 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 Mood/Behavior/Sleep: Provide emotional support.  Valium 5 mg daily Lexapro 20 mg daily, Depakote 500 mg twice daily             -antipsychotic agents: N/A 5. Neuropsych/cognition: This patient is not capable of making decisions on his own behalf. 6. Skin/Wound Care: Routine skin checks 7. Fluids/Electrolytes/Nutrition: Routine in and outs with follow-up chemistries  - 2/3: Hypoglycemia, poor POs d/t lethargy. Labs ordered d/t 11 pt Na drop 1/29-2/1. Will likely need to start maintenance IVF NS 75/hr.  -2/4: See BMP results above; Na 133->134, K 5.7->5.5, and Cr  1.64->1.53 with 1 L IVF yesterday. Continue today, due to hypoglycemia switching to D5 1/2NS 1L overnight. Encouraged PO fluids. AM labs.  - Calorie counts, dietary consult placed to see if  diet can be further optimized to  reduce hypoglycemic events 2/5 will order lokelma for K+ 5.8, give 10g lokelma  2/6 K+ continues to be elevated, increase IVF to176m/hr, change ensure to nepro, discussed with pharmacy-no meds likely contributing, lokelma TID for 3 doses planned -Addendum K+ down to 4.6, will hold off on further lokelma, recheck tomorrow    8.  Seizure disorder/epilepsy.  EEG negative.  Keppra 1000 mg every 12 hours, Depakote 500 mg twice a day  9.  Diabetes mellitus.  Currently SSI.  Patient on Lantus insulin 40 units nightly and Amaryl 2 mg daily prior to admission.  Resume as needed.  CBG (last 3)  Recent Labs    09/05/22 1652 09/05/22 2127 09/06/22 0541  GLUCAP 114* 168* 77     - 2/3: fall likely d/t poor POs overnight; not on standing insulin; eating 10-80% meals per chart.  - 2/4: Improved BG with D5 1/2 NS -2/5 start D5NS 2/6 Continues to have episodes of low glucose, increase D5NS to 1024mhr, encourage snack 10.  Hyperlipidemia.  Lipitor '80mg'$  daily 11.  CAD with CABG 2017.  Continue Imdur 30 mg daily.  No chest pain or shortness of breath. 12.  History of hypertension.  Patient on Norvasc 10 mg daily, Lopressor 12.5 mg twice daily, lisinopril 5 mg daily, BP  Vitals:   09/05/22 1930 09/06/22 0553  BP: 103/60 104/64  Pulse: 63 (!) 57  Resp: 17 18  Temp: 98.3 F (36.8 C) 98.2 F (36.8 C)  SpO2: 100% 100%    2/3: Hypotensive, bradycardia, low intakes as above. Hold norvasc 2/4: Losartan Dced d/t hyperkalemia, AKI. Remains hypotensive today; may need to adjust CHF medications next if symptomatic.  2/6 well controlled  13.  COVID-19.  Resolved.  Precautions have been discontinued 14. Hyponatremic, Na 129  -2/5, was given D5 1/2 NS last night, will start D5NS  5040mr  -2/6 Na improved to 135 today 15. AKI  -2/5 Start IVF  2/6 improving, increase IVF to100m72m    LOS: 6 days A FACE TO FACE EVALUATION WAS PERFORMED  YuriJennye Boroughs/2024, 8:20 AM

## 2022-09-06 NOTE — Progress Notes (Signed)
Speech Language Pathology Daily Session Note  Patient Details  Name: Melvin Taylor MRN: [id number] Date of Birth: August 28, 1965  Today's Date: 09/06/2022 SLP Individual Time: 0800-0900 SLP Individual Time Calculation (min): 60 min  Short Term Goals: Week 1: SLP Short Term Goal 1 (Week 1): Patient will consume current diet with minimal overt s/sx of aspiration with min A for implementation of swallowing precautions/strategies SLP Short Term Goal 2 (Week 1): Patient will participate in further cognitive-linguistic, speech/language evaluation with 100% completion SLP Short Term Goal 3 (Week 1): Patient will respond to basic yes/no questions pertaining to environment and biographical information with 50% accuracy given mod A verbal/visual cues SLP Short Term Goal 4 (Week 1): Patient will follow 1-step commads with 50% accuracy given mod A verbal/visual cues SLP Short Term Goal 5 (Week 1): Patient will name objects with 50% accuracy given max A multimodal cues SLP Short Term Goal 6 (Week 1): Patient will orient x4 with max A multimodal cues  Skilled Therapeutic Interventions: Skilled ST treatment focused on communication and swallowing goals. Pt was greeted semi-reclined in bed on arrival. Pt opened his eyes to acknowledge SLP's presence, but then quickly returned to closing his eyes and kept them closed for majority of session. Pt exhibited minimal verbal communication with SLP despite max attempts. Pt infrequently responded with "mhh hmm" or "nuh uh." There were times pt spontaneously responded with open ended responses, but these instances were very limited and suspect attributed to motivation/desire. Nurse tech reported pt was able to engage in a conversation on the phone last night at the sentence level appearing increasingly motivated to speak with family members. It appears pt is uninterested in communicating with hospital Question appropriateness of communication goals at this time due to lack of  participation.   Pt was agreeable to consume nectar thick liquids by straw with no overt s/sx of aspiration, and mod A verbal cues to implement swallowing safety strategies to include one, small sip at a time.  SLP facilitated pharyngeal strengthening exercises aimed to enhance hyolaryngeal excursion for improved epiglottic inversion, which was observed to be decreased during MBS dated 08/30/22. Pt successfully executed 30 repetitions with mod-to-max A verbal cues to obtain adequate positioning (tuck chin rather than engage jaw). Achieving this took a considerable amount of time and coaxing (40 minutes). SLP attempted to facilitate the Masako maneuver however pt was unable to successfully protrude tongue; unsure if attributed to oral motor planning vs. motivation.  Patient was left in bed with alarm activated and immediate needs within reach at end of session. RD arrived at end of session. Continue per current plan of care.      Pain Pain Assessment Pain Scale: 0-10 Pain Score: 0-No pain  Therapy/Group: Individual Therapy  Gevork Ayyad T Thelbert Gartin 09/06/2022, 8:11 AM

## 2022-09-06 NOTE — Progress Notes (Signed)
Occupational Therapy Session Note  Patient Details  Name: Melvin Taylor MRN: [id number] Date of Birth: 07/24/1966  Today's Date: 09/06/2022 OT Individual Time: 0902-0959 OT Individual Time Calculation (min): 57 min  OT Individual Time: 1500-1530 OT Individual Time Calculation (min): 30 min  Short Term Goals: Week 1:  OT Short Term Goal 1 (Week 1): Pt will consistently complete toilet transfer min A sing LRAD OT Short Term Goal 2 (Week 1): Pt will utilize RUE during fucntional tasks ~50% of time with mod cueing OT Short Term Goal 3 (Week 1): Pt will maintain dynamic sitting balance CGA using LRAD OT Short Term Goal 4 (Week 1): Pt will sustain attention during BADL/functional task in quiet environment with mod cueing  Skilled Therapeutic Interventions/Progress Updates:     AM Session: MD in room performing morning rounds upon OT arrival. Pt with head covered receptive to skilled OT session with moderate encouragement with OT offering to assist Pt to bathroom. Pt reporting 0/10 pain, however presenting with pain in LLE and bottom during mobilization- repositioning and rest breaks provided. TED hose donned in supine total A.Vitals supine BP 118/73 (87) Hr 58.  Pt transitioned from supine to EOB using log roll technique to L with mod A to lift trunk and bring legs off EOB. Max cueing required during supine>sit with Pt able to initiate lowering legs and pushing through LUE. Rn entering room to disconnect IV for BADLs and provide morning medications. Increased time required for Pt to take medications sitting EOB with maximal motivation required to swallow vs. hold crushed medications in mouth. OT collaborating with Rn to provide encouragement. Pt able to maintain static sitting balance with SBA ~10 minutes while taking medications.  Pt reporting need to use restroom. Set-up urinal with Pt sitting EOB, however Pt unable to void. Pt completed sit>stand min A and maintained static standing balance CGA  with lower extremity support provided from bed. Pt unable to void in standing requesting to use elevated toilet seat. Stand>pivot to wc min A with mod cueing for stepping feet. Transported to bathroom total A in wc stand pivot to elevated toilet seat min A. Pt provided increased time on toilet d/t need for BM and void (document in flowsheets). Pt total A for posterior peri-care with Pt able to maintain balance CGA with LUE supported on grab bar. Pt stand pivot to wc min A and mod cueing for technique and safety.  Pt transported to room total A. Pt completed U/LB dressing seated in w/c with max A required to don shirt and max motivation d/t Pt decreased participation in session. Pt able to weave LLE into pants requiring max A to weave R leg and bring pants to waist, however Pt attempting to bring pants to waist initiating use of RUE.  Vitals end of session BP 11/70 (83) Hr 62. Pt was left resting in his wc with set belt alarm on, call bell in reach, and all needs met. Nursing staff notified of Pt status.   PM Session:  Pt received sleeping in bed moderately difficult to wake upon OT arrival. Pt presenting disinterested in OT session, however receptive to in-room therapy with maximal motivation provided. Focus this session R NMR and bed mobility training. Pt reporting no pain at beginning of session with pain observed during movement on RU/LE- rest breaks and repositioning provided. Bed flattened to simulate home bed environment. Guided Pt through learning functional movement patterns for coming into sitting. Initially having Pt lift head and begin to lift  trunk off bed with gentle guidance. Facilitated Pt bringing legs into chest  in preparation for rolling to L. Pt able to bring legs off bed and come onto side with min A and max cueing. Mod A to lift trunk with Pt initiating pushing through LUE.  EOB OT provided gentle stretch of RUE with increase in spasticity noted at beginning and end ranged of elbow  flexion. Pt intermittently following single-step directions EOB with decreased participation and increased processing time required this session. Facilitated weightbearing through RUE with Pt tolerating well. Pt able to weight shift to increase weight in RUE and bend/straighten arm through partial ROM with assist.  Pt educated on transition from sit>supine. Pt able to lower trunk onto elbow bring head onto pillow with light mod A to guide movement and mod A to bring legs into bed. Transitioned onto back with supervision. Pt was left resting in bed with call bell in reach, seat belt alarm on, and all needs met.    Therapy Documentation Precautions:  Precautions Precautions: Fall, Other (comment) Precaution Comments: R hemiplegia with R UE flexor tone and R LE extensor tone (LE tone primarily in the bed), Aphasia, delayed processing Required Braces or Orthoses: Other Brace Other Brace: R UE aircast for tone management Restrictions Weight Bearing Restrictions: No General:   Vital Signs: Therapy Vitals Temp: 98.2 F (36.8 C) Temp Source: Oral Pulse Rate: (Abnormal) 57 Resp: 18 BP: 104/64 Patient Position (if appropriate): Lying Oxygen Therapy SpO2: 100 % O2 Device: Room Air Pain: Pain Assessment Pain Scale: 0-10 Pain Score: 0-No pain ADL: ADL Eating: Not assessed Grooming: Maximal assistance Where Assessed-Grooming: Edge of bed Upper Body Bathing: Moderate assistance Where Assessed-Upper Body Bathing: Edge of bed Lower Body Bathing: Maximal assistance Where Assessed-Lower Body Bathing: Edge of bed Upper Body Dressing: Maximal assistance Where Assessed-Upper Body Dressing: Edge of bed Lower Body Dressing: Maximal assistance Where Assessed-Lower Body Dressing: Edge of bed Toileting: Dependent Where Assessed-Toileting: Bed level Toilet Transfer: Moderate assistance Toilet Transfer Method: Sit pivot Toilet Transfer Equipment: Engineer, technical sales Transfer: Not  assessed Social research officer, government: Moderate assistance Social research officer, government Method: Radiographer, therapeutic: Grab bars, Transfer tub bench   Therapy/Group: Individual Therapy  Janey Genta 09/06/2022, 9:13 AM

## 2022-09-06 NOTE — Progress Notes (Signed)
Physical Therapy Session Note  Patient Details  Name: Melvin Taylor MRN: [id number] Date of Birth: 07-22-1966  Today's Date: 09/06/2022 PT Individual Time: 1107-1202 PT Individual Time Calculation (min): 55 min   Short Term Goals: Week 1:  PT Short Term Goal 1 (Week 1): Pt will perform supine<>sit with CGA consistently PT Short Term Goal 2 (Week 1): Pt will perform sit<>stands using LRAD with CGA PT Short Term Goal 3 (Week 1): Pt will perform bed<>chair transfers using LRAD with CGA PT Short Term Goal 4 (Week 1): Pt will ambulate at least 20f using LRAD with CGA  Skilled Therapeutic Interventions/Progress Updates:    Pt received supine in bed with covers over his head and requires max, gentle encouragement and >18mutes to initiate OOB mobility with continuous coaxing and education on importance of participating in therapy to increase his independence with functional mobility to work towards a safe D/C home with support. Pt eventually initiates moving LEs off EOB with tactile facilitation and then requires max assist to bring trunk upright due to poor pt engagement/effort. Donned tennis shoes total assist.   Throughout session pt continues to require ++significantly increased time to initiate a mobility task after being cued due to delayed initiation and poor engagement.  Sit>stand EOB>L HHA with mod assist for rising to stand due to poor pt engagement/effort and due to impaired B LE strength. R stand pivot EOB>w/c using L HHA with min assist for balance while turning and pt able to lower to sitting with heavy min assist demoing great forward trunk flexion.    Transported to/from gym in w/c for time management and energy conservation.  NT in/out to assess blood sugar and noted to be 63 - retrieved pt nectar thick OJ and pt consumed and nurse approved pt for continued participation in therapy session - by end of session BS had improved to 79.   Gait training ~8089f2 using L HHA with min  assist for balance - continues to have intermittent poor R LE foot clearance with associated increased hip external rotation, slightly wider BOS, increased postural sway, and decreased gait speed.  At end of session, pt left seated in w/c with needs in reach and seat belt alarm on preparation for assistance with his meal.    Therapy Documentation Precautions:  Precautions Precautions: Fall, Other (comment) Precaution Comments: R hemiplegia with R UE flexor tone and R LE extensor tone (LE tone primarily in the bed), Aphasia, delayed processing Required Braces or Orthoses: Other Brace Other Brace: R UE aircast for tone management Restrictions Weight Bearing Restrictions: No   Pain:  Pt intermittently moaning, vocalizing pain/discomfort when stretching or when initiating movement and when asked reports it is either in his R UE or R LE - anticipate this is due to increased tone.   Therapy/Group: Individual Therapy  CarTawana ScalePT, DPT, NCS, CSRS 09/06/2022, 8:02 AM

## 2022-09-06 NOTE — Plan of Care (Signed)
  Problem: RH Swallowing Goal: LTG Patient will consume least restrictive diet using compensatory strategies with assistance (SLP) Description: LTG:  Patient will consume least restrictive diet using compensatory strategies with assistance (SLP) Flowsheets (Taken 09/06/2022 1845) LTG: Pt Patient will consume least restrictive diet using compensatory strategies with assistance of (SLP): Supervision Goal: LTG Patient will participate in dysphagia therapy to increase swallow function with assistance (SLP) Description: LTG:  Patient will participate in dysphagia therapy to increase swallow function with assistance (SLP) Flowsheets (Taken 09/06/2022 1845) LTG: Pt will participate in dysphagia therapy to increase swallow function with assistance of (SLP): Supervision Goal: LTG Pt will demonstrate functional change in swallow as evidenced by bedside/clinical objective assessment (SLP) Description: LTG: Patient will demonstrate functional change in swallow as evidenced by bedside/clinical objective assessment (SLP) Flowsheets (Taken 09/06/2022 1845) LTG: Patient will demonstrate functional change in swallow as evidenced by bedside/clinical objective assessment: Oropharyngeal swallow

## 2022-09-06 NOTE — Progress Notes (Signed)
Hypoglycemic Event  CBG: 63  Treatment: 8 oz juice/soda, pt on D5 37m/ hour cont.   Symptoms: None  Follow-up CBG: Time:1156 CBG Result:79  Possible Reasons for Event: Inadequate meal intake  Comments/MD notified:PA notified, okay to wait for pt to have lunch delivered. Cont D5 581mper hour.     TaMelene Plan

## 2022-09-07 LAB — GLUCOSE, CAPILLARY
Glucose-Capillary: 135 mg/dL — ABNORMAL HIGH (ref 70–99)
Glucose-Capillary: 105 mg/dL — ABNORMAL HIGH (ref 70–99)
Glucose-Capillary: 94 mg/dL (ref 70–99)
Glucose-Capillary: 143 mg/dL — ABNORMAL HIGH (ref 70–99)

## 2022-09-07 LAB — BASIC METABOLIC PANEL
Anion gap: 8 (ref 5–15)
BUN: 17 mg/dL (ref 6–20)
CO2: 20 mmol/L — ABNORMAL LOW (ref 22–32)
Calcium: 8.1 mg/dL — ABNORMAL LOW (ref 8.9–10.3)
Chloride: 107 mmol/L (ref 98–111)
Creatinine, Ser: 1.12 mg/dL (ref 0.61–1.24)
GFR, Estimated: >60 mL/min (ref >60)
Glucose, Bld: 115 mg/dL — ABNORMAL HIGH (ref 70–99)
Potassium: 4.7 mmol/L (ref 3.5–5.1)
Sodium: 135 mmol/L (ref 135–145)

## 2022-09-07 MED ORDER — MODAFINIL 100 MG PO TABS: 100.0000 mg | ORAL_TABLET | Freq: Every day | ORAL | Status: DC

## 2022-09-07 MED ORDER — MODAFINIL 100 MG PO TABS
100.0000 mg | ORAL_TABLET | Freq: Every day | ORAL | Status: AC
  Administered 2022-09-07 – 2022-09-13 (×7): 100 mg via ORAL
  Filled 2022-09-07 (×7): qty 1

## 2022-09-07 NOTE — Progress Notes (Signed)
Physical Therapy Session Note  Patient Details  Name: Melvin Taylor MRN: [id number] Date of Birth: [birth year]  Today's Date: 09/07/2022 PT Individual Time: 0915-      Short Term Goals: Week 1:  PT Short Term Goal 1 (Week 1): Pt will perform supine<>sit with CGA consistently PT Short Term Goal 2 (Week 1): Pt will perform sit<>stands using LRAD with CGA PT Short Term Goal 3 (Week 1): Pt will perform bed<>chair transfers using LRAD with CGA PT Short Term Goal 4 (Week 1): Pt will ambulate at least 12f using LRAD with CGA  Skilled Therapeutic Interventions/Progress Updates:    Chart reviewed. Pt initially agreeable to PT with strong encouragement. PT began to support pt transfer to EOB, and pt became reluctant to participate. PT continued to educate pt on need for therapy and encouraged pt's home mobility goals. Pt continued to refuse therapy.  At end of session, pt was left semi-reclined with alarm engaged, nurse call bell and all needs in reach.     Therapy Documentation Precautions:  Precautions Precautions: Fall, Other (comment) Precaution Comments: R hemiplegia with R UE flexor tone and R LE extensor tone (LE tone primarily in the bed), Aphasia, delayed processing Required Braces or Orthoses: Other Brace Other Brace: R UE aircast for tone management Restrictions Weight Bearing Restrictions: No General: PT Amount of Missed Time (min): 30 Minutes PT Missed Treatment Reason: Patient unwilling to participate    Therapy/Group: Individual Therapy  KMarquette Old PT, DPT 09/07/2022, 10:56 AM

## 2022-09-07 NOTE — Progress Notes (Signed)
Physical Therapy Session Note  Patient Details  Name: Melvin Taylor MRN: [id number] Date of Birth: Oct 28, 1965  Today's Date: 09/07/2022 PT Individual Time: 1107-1202 PT Individual Time Calculation (min): 55 min   Short Term Goals: Week 1:  PT Short Term Goal 1 (Week 1): Pt will perform supine<>sit with CGA consistently PT Short Term Goal 2 (Week 1): Pt will perform sit<>stands using LRAD with CGA PT Short Term Goal 3 (Week 1): Pt will perform bed<>chair transfers using LRAD with CGA PT Short Term Goal 4 (Week 1): Pt will ambulate at least 21f using LRAD with CGA  Skilled Therapeutic Interventions/Progress Updates:    Pt received supine in bed eyes open, awake and upon seeing thearpist pt places the blanket over his head. With gentle encouragement and education pt agreeable to therapy session. Nurse present to disconnect IV for session.  Pt continues to require 10+ minutes to initiate OOB mobility with consistent gentle encouragement. Supine>sitting L EOB, HOB partially elevated, bedrail available but pt not using it with continued heavy mod assist to bring trunk upright but pt demoing improving initiation of using L UE to assist with this.   Sitting EOB started with close supervision/CGA while donning clean shirt with mod/max assist. Progressed to requiring max/total assist for sitting balance EOB due to strong/persistent L posterior trunk lean when trying to perform LB clothing management requiring total assist. Pt noted to be incontinent of bladder - retrieved +2 assist.   Sit>stand EOB>L HHA with heavy mod/max assist of 1 and static standing with heavy max assist due to strong L posterior lean while +2 provided dependent LB clothing management and peri-care. R stand pivot to w/c using L HHA with pt having impaired motor planning turning to face chair rather than move hips towards it requiring heavy max assist to pivot correct direction while maintaining balance.   Assessed vitals: BP  127/77 (MAP 92), HR 57bpm, SpO2 100% on RA Blood sugar taken ~347m prior to be 105.    Transported to/from gym in w/c for time management and energy conservation.  Sit>stand w/c>L HHA in therapy gym with light mod assist at this time for lifting to stand and maintain balance due to continued posterior lean bias and lack of adequate anterior trunk flexion/hip hinge to initiate the transfer (+2 close by for safety).   Gait training ~[id number] to his room with L HHA and +2 providing close guarding transitioned to providing w/c follow after ~[id number] heavy min assist from thearpist for balance due to posterior lean bias - continues to have excessive B LE hip external rotation, poor L weight shift to allow adequate R LE foot clearance and swing phase advancement (cuing to improve) resulting in occasional minor tripping over R toes, lacks trunk rotation with guarded posturing - varying between step-to leading with L LE to slight reciprocal pattern.   NMR training focusing on improving transfers of sit<>stand x5 to [facility] heavy min progressed to light min assist with cuing for increased anterior trunk flexion/hip hinge.  At end of session, pt left seated in w/c with needs in reach and seat belt alarm on. Pt reports need to use bathroom - NT notified.    Therapy Documentation Precautions:  Precautions Precautions: Fall, Other (comment) Precaution Comments: R hemiplegia with R UE flexor tone and R LE extensor tone (LE tone primarily in the bed), Aphasia, delayed processing Required Braces or Orthoses: Other Brace Other Brace: R UE aircast for tone management Restrictions Weight Bearing Restrictions:  No   Pain:  Pt continues to have brief moments of discomfort in R hemibody due to abnormal tone when yawning or stretching.    Therapy/Group: Individual Therapy  Tawana Scale , PT, DPT, NCS, CSRS 09/07/2022, 7:57 AM

## 2022-09-07 NOTE — Progress Notes (Addendum)
PROGRESS NOTE   Subjective/Complaints:  Pt awake but with delayed responses.  Slowly eating breakfast with SLP, (couple bites in 108mn) voices no c/os       09/07/2022    4:41 AM 09/06/2022    9:16 PM 09/06/2022    1:56 PM  Vitals with BMI  Systolic [id number] Diastolic 68 68 78  Pulse 60 65 56    ROS- cannot obtain due to cognitive deficits  Objective:   No results found. No results for input(s): "WBC", "HGB", "HCT", "PLT" in the last 72 hours.  Recent Labs    09/06/22 1954 09/07/22 0718  NA 135 135  K 4.6 4.7  CL 108 107  CO2 23 20*  GLUCOSE 79 115*  BUN 19 17  CREATININE 1.22 1.12  CALCIUM 8.3* 8.1*     Intake/Output Summary (Last 24 hours) at 09/07/2022 0835 Last data filed at 09/06/2022 1751 Gross per 24 hour  Intake 236 ml  Output 275 ml  Net -39 ml         Physical Exam: Vital Signs Blood pressure 122/68, pulse 60, temperature 98 F (36.7 C), resp. rate 18, height '5\' 5"'$  (1.651 m), weight 53.2 kg, SpO2 99 %.   General: No acute distress. Lying in bed with blanket covering his face Heart: Regular rate and rhythm no rubs murmurs or extra sounds Lungs: Clear to auscultation, breathing unlabored, no rales or wheezes Abdomen: Positive bowel sounds, soft nontender to palpation, nondistended Extremities: No clubbing, cyanosis, or edema Skin: No evidence of breakdown, no evidence of rash Neurologic: Moving all 4 extremities in bed, antigravity and against resistance. Oriented to self and year with cues. Delayed responses.  Can follow simple commands.  Psych: Mildly impulsive, flat affect   Prior exams: Cranial nerves II through XII intact, motor strength is 4/5 in bilateral , bicep, tricep, grip, hip flexor, knee extensors, ankle dorsiflexor and plantar flexor, left delt RIght deltoid MMT limited by pain    Musculoskeletal:RIght shoulder pain limits abd and flexion , also limited ROM with  ER   Assessment/Plan: 1. Functional deficits which require 3+ hours per day of interdisciplinary therapy in a comprehensive inpatient rehab setting. Physiatrist is providing close team supervision and 24 hour management of active medical problems listed below. Physiatrist and rehab team continue to assess barriers to discharge/monitor patient progress toward functional and medical goals  Care Tool:  Bathing    Body parts bathed by patient: Chest, Abdomen, Right upper leg, Left upper leg, Face   Body parts bathed by helper: Front perineal area, Buttocks, Left lower leg, Right lower leg, Left arm, Right arm     Bathing assist Assist Level: Maximal Assistance - Patient 24 - 49%     Upper Body Dressing/Undressing Upper body dressing   What is the patient wearing?: Pull over shirt    Upper body assist Assist Level: Maximal Assistance - Patient 25 - 49%    Lower Body Dressing/Undressing Lower body dressing      What is the patient wearing?: Underwear/pull up, Pants     Lower body assist Assist for lower body dressing: Maximal Assistance - Patient 25 - 49%  Toileting Toileting    Toileting assist Assist for toileting: Dependent - Patient 0%     Transfers Chair/bed transfer  Transfers assist     Chair/bed transfer assist level: Moderate Assistance - Patient 50 - 74% Chair/bed transfer assistive device:  (HHA)   Locomotion Ambulation   Ambulation assist      Assist level: Minimal Assistance - Patient > 75% Assistive device: Hand held assist Max distance: 107f   Walk 10 feet activity   Assist     Assist level: 2 helpers Assistive device: Hand held assist   Walk 50 feet activity   Assist Walk 50 feet with 2 turns activity did not occur: Safety/medical concerns         Walk 150 feet activity   Assist Walk 150 feet activity did not occur: Safety/medical concerns         Walk 10 feet on uneven surface  activity   Assist Walk 10 feet  on uneven surfaces activity did not occur: Safety/medical concerns         Wheelchair     Assist Is the patient using a wheelchair?: Yes (for transport) Type of Wheelchair: Manual    Wheelchair assist level: Dependent - Patient 0%      Wheelchair 50 feet with 2 turns activity    Assist        Assist Level: Dependent - Patient 0%   Wheelchair 150 feet activity     Assist      Assist Level: Dependent - Patient 0%   Blood pressure 122/68, pulse 60, temperature 98 F (36.7 C), resp. rate 18, height '5\' 5"'$  (1.651 m), weight 53.2 kg, SpO2 99 %.  Medical Problem List and Plan: 1. Functional deficits secondary to acute metabolic encephalopathy.  Stable trace subdural hematoma along the posterior falx and posterior occipital lobe.  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  Received inpatient rehab services 12/21/2020 - 12/30/2020 after CVA             -patient may shower             -ELOS/Goals: 09/15/2022 Min A level             -Continue CIR,  PT OT and SLP  -Team conference today please see physician documentation under team conference tab, met with team  to discuss problems,progress, and goals. Formulized individual treatment plan based on medical history, underlying problem and comorbidities.  2.  Antithrombotics: -DVT/anticoagulation:  Mechanical:  Antiembolism stockings, knee (TED hose) Bilateral lower extremities             -antiplatelet therapy: Brilinta 90 mg twice daily 3. 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    4. Mood/Behavior/Sleep: Provide emotional support.  Valium 5 mg daily Lexapro 20 mg daily, Depakote 500 mg twice daily              -antipsychotic agents: N/A Trial modafanil during inpt stay will d/c when he goes home 5. Neuropsych/cognition: This patient is not capable of making decisions on his own behalf. 6. Skin/Wound Care: Routine skin checks 7. Fluids/Electrolytes/Nutrition: Routine in and outs with follow-up chemistries  -2/4: See  BMP results above; Na 133->134, K 5.7->5.5, and Cr 1.64->1.53 with 1 L IVF yesterday. Continue today, due to hypoglycemia switching to D5 1/2NS 1L overnight. Encouraged PO fluids. AM labs.  - Calorie counts, dietary consult placed to see if  diet can be further optimized to reduce hypoglycemic events 2/5 will order lokelma for K+ 5.8, give 10g lokelma  2/6 K+ continues to be elevated, increase IVF to169m/hr, change ensure to nepro, discussed with pharmacy-no meds likely contributing, lokelma TID for 3 doses planned -Addendum K+ down to 4.6, will hold off on further lokelma, recheck tomorrow  Stable 2/7  8.  Seizure disorder/epilepsy.  EEG negative.  Keppra 1000 mg every 12 hours, Depakote 500 mg twice a day  9.  Diabetes mellitus.  Currently SSI.  Patient on Lantus insulin 40 units nightly and Amaryl 2 mg daily prior to admission.  Resume as needed.  CBG (last 3)  Recent Labs    09/06/22 1632 09/06/22 2115 09/07/22 0550  GLUCAP 164* 126* 135*    10.  Hyperlipidemia.  Lipitor '80mg'$  daily 11.  CAD with CABG 2017.  Continue Imdur 30 mg daily.  No chest pain or shortness of breath. 12.  History of hypertension.  Patient on Norvasc 10 mg daily, Lopressor 12.5 mg twice daily, lisinopril 5 mg daily, BP  Vitals:   09/06/22 2116 09/07/22 0441  BP: 96/68 122/68  Pulse: 65 60  Resp: 17 18  Temp: 97.8 F (36.6 C) 98 F (36.7 C)  SpO2: 100% 99%    2/3: Hypotensive, bradycardia, low intakes as above. Hold norvasc 2/4: Losartan Dced d/t hyperkalemia, AKI. Remains hypotensive today; may need to adjust CHF medications next if symptomatic.  2/6 well controlled  13.  COVID-19.  Resolved.   Precautions have been discontinued 14. Hyponatremic, Na 129  -2/5, was given D5 1/2 NS last night, will start D5NS 556mhr  -2/6 Na improved to 135 today 15. AKI  -2/5 Start IVF  2/6 improving, increase IVF [id number]      Latest Ref Rng & Units 09/07/2022    7:18 AM 09/06/2022    7:54 PM 09/06/2022   10:44 AM  BMP  Glucose 70 - 99 mg/dL 115  79  80   BUN 6 - 20 mg/dL '17  19  18   '$ Creatinine 0.61 - 1.24 mg/dL 1.12  1.22  1.32   Sodium 135 - 145 mmol/L 135  135  135   Potassium 3.5 - 5.1 mmol/L 4.7  4.6  6.1   Chloride 98 - 111 mmol/L 107  108  105   CO2 22 - 32 mmol/L '20  23  23   '$ Calcium 8.9 - 10.3 mg/dL 8.1  8.3  8.7      LOS: 7 days A FACE TO FACE EVALUATION WAS PERFORMED  AndCharlett Blake7/2024, 8:35 AM

## 2022-09-07 NOTE — Progress Notes (Signed)
Speech Language Pathology Daily Session Note  Patient Details  Name: Melvin Taylor MRN: [id number] Date of Birth: [birth year]  Today's Date: 09/07/2022 SLP Individual Time: 0800-0900 SLP Individual Time Calculation (min): 60 min  Short Term Goals: Week 1: SLP Short Term Goal 1 (Week 1): Patient will consume current diet with minimal overt s/sx of aspiration with min A for implementation of swallowing precautions/strategies SLP Short Term Goal 2 (Week 1): Patient will participate in further cognitive-linguistic, speech/language evaluation with 100% completion SLP Short Term Goal 3 (Week 1): Patient will respond to basic yes/no questions pertaining to environment and biographical information with 50% accuracy given mod A verbal/visual cues SLP Short Term Goal 4 (Week 1): Patient will follow 1-step commads with 50% accuracy given mod A verbal/visual cues SLP Short Term Goal 5 (Week 1): Patient will name objects with 50% accuracy given max A multimodal cues SLP Short Term Goal 6 (Week 1): Patient will orient x4 with max A multimodal cues  Skilled Therapeutic Interventions: Skilled ST treatment focused on communication and swallowing goals. Pt was greeted in bed on arrival and required an extensive amount of time to rouse in order to participate. Pt with limited verbal output with SLP, mostly responding to yes/no questions during ~25% of occasions, very limited open ended responses. SLP attempted to facilitate verbal expression through identifying preferences with morning meal with max A multimodal cues (e.g., "do you want this or that?", yes/no questions, requesting pt to point) with minimal response. Pt was receptive to consuming ~25% of breakfast if SLP loaded spoon and handed it to pt with mod A. Pt would not initiate self feeding but do not suspect this is attributed to deficits with initiation, and rather willingness/unwillingness to do so. Pt consumed dysphagia 2 textures with prolonged  mastication (up to 1 minute per bolus with sausage) but functional enough for bolus breakdown, oral transit, minimal oral residuals, and occ delayed cough. Pt exhibited improved mastication effectiveness with eggs vs.ground sausage. Suspect edentulous status contributes to mastication effectiveness. Pt consumed applesauce with timely oral transit, good oral clearance, and no overt s/sx of aspiration. Pt consumed nectar thick liquids by straw with min A verbal cues to take small, single sips resulting in immediate cough x1, delayed cough x1, and delayed throat clear x1. No overt s/sx of aspiration were observed when pt took small, controlled sips. SLP continues to recommend dysphagia 2 textures as I anticipate a downgrade to pureed textures would result in even more of a decline with PO intake which we highly want to avoid at this time. Based on presentation at bedside, pt does not appear clinically ready for consideration of a repeat MBS at this time, but will continue to assess readiness.   Pt requested to call his sister at end of session (i.e., "I want to call my sister") requiring total A to navigate phone. Sister did not answer but pt left a message while communicating at the sentence level with average verbal fluency, some word finding hesitation, and appropriate syntax (i.e., "Hi [name], it's your brother Michaelanthony. Wanted to talk to you about the doctor visit. Call me back when you can. Bye"). Based on this observation, it is evident pt is able to verbally communicate needs given desire to do so. After speaking with team during conference, it appears sister will be making efforts to spend more time with pt in CIR. Hopeful this may improve pt's participation. Will discuss pt's current LOF vs. PLOF and consider discontinuing language goals given pt  is near/at baseline, and due to poor participation.   Patient was left in bed with alarm activated and immediate needs within reach at end of session. Continue per  current plan of care.      Pain No response  Therapy/Group: Individual Therapy  Patty Sermons 09/07/2022, 8:52 AM

## 2022-09-07 NOTE — Progress Notes (Addendum)
Calorie Count Note  48 hour calorie count ordered. Day 2 results:  Diet: dysphagia 2 with nectar-thick liquids Supplements: previously changed to Ensure Enlive po TID between meals, changed to Nepro po TID between meals yesterday by team in setting of hyperkalemia  Breakfast 2/6: 100% eggs, 90% pork sausage patty, 10% puree pineapple, 50% nectar thick orange juice (362 kcal, 16 grams of protein) Lunch 2/6: 100% chicken with gravy, 100% mashed potatoes, 10% broccoli, 100% nectar thick sweet tea, 100% Magic Cup (611 kcal, 30 grams of protein) Dinner 2/6: 100% rosemary chicken with gravy, 100% mashed potatoes, 5% puree pears, 100% nectar thick sweet tea, 100% Magic Cup (678 kcal, 38 grams of protein) Snack 2/6: N/A (didn't receive) Supplements 2/6: 1 bottle Nepro + 25% of another bottle of Nepro (525 kcal, 23 grams of protein)  Total intake day 2: 2176 kcal (>100% of estimated needs)  107 grams of protein (>100% of estimated needs)  Estimated Nutritional Needs:  Kcal:  1800-2000 Protein:  90-100 grams Fluid:  1.8-2 L/day  Nutrition Dx: Severe Malnutrition related to social / environmental circumstances as evidenced by severe fat depletion, severe muscle depletion.    Goal: Patient will meet greater than or equal to 90% of their needs    Intervention:  -Will discontinue calorie count as 48 hour calorie count is now complete. -Continue Nepro Shake po TID between meals, each supplement provides 425 kcal and 19 grams protein. If served cold will be nectar-like. If room temperature, please thicken to appropriate consistency per SLP recommendations. -Pt is currently receiving Magic Cup with lunch and dinner with dysphagia 2/nectar thick diet. Will also order to come with breakfast trays as he likes these supplements and he will then receive TID. Each supplement provides 290 kcal and 9 grams of protein. -Continue bedtime snack to be delivered at [place], MS, RD, LDN,  [place] Pager number available on Amion

## 2022-09-07 NOTE — Progress Notes (Signed)
Speech Language Pathology Weekly Progress and Session Note  Patient Details  Name: Melvin Taylor MRN: [id number] Date of Birth: September 14, 1965  Beginning of progress report period: September 01, 2022 End of progress report period: September 08, 2022  {chl ip rehab slp time calculations:304100500}  Short Term Goals: Week 1: SLP Short Term Goal 1 (Week 1): Patient will consume current diet with minimal overt s/sx of aspiration with min A for implementation of swallowing precautions/strategies SLP Short Term Goal 1 - Progress (Week 1): Not met SLP Short Term Goal 2 (Week 1): Patient will participate in further cognitive-linguistic, speech/language evaluation with 100% completion SLP Short Term Goal 2 - Progress (Week 1): Discontinued (comment) (minimal participation in language goals) SLP Short Term Goal 3 (Week 1): Patient will respond to basic yes/no questions pertaining to environment and biographical information with 50% accuracy given mod A verbal/visual cues SLP Short Term Goal 3 - Progress (Week 1): Met SLP Short Term Goal 4 (Week 1): Patient will follow 1-step commads with 50% accuracy given mod A verbal/visual cues SLP Short Term Goal 4 - Progress (Week 1): Not met SLP Short Term Goal 5 (Week 1): Patient will name objects with 50% accuracy given max A multimodal cues SLP Short Term Goal 5 - Progress (Week 1): Not progressing SLP Short Term Goal 6 (Week 1): Patient will orient x4 with max A multimodal cues SLP Short Term Goal 6 - Progress (Week 1): Met    New Short Term Goals: {NAT:[id number]}  Weekly Progress Updates: Patient has made minimal gains and has met 2 of 6 STGs this reporting period due to limited participation. When the desire to communicate appears present, pt appears able to communicate functional needs verbally with min A cues and extended time. Pt is currently requiring max-to-total A to participate in any meaningful language tasks with very little participation. Per pt's  sister, it appears pt is near/at baseline from a communication standpoint. Recommend further discussion to determine appropriateness of current POC prior to consideration of discontinuing communication goals. Sister plans to be present for family education this week. Pt has participated in swallowing therapy with extensive coaxing and mod-to-max A verbal cues to effectively execute exercises. Pt is currently consuming a dysphagia 2 diet with nectar thick liquids and min overt s/sx of aspiration with mod A to implement swallowing compensatory strategies. Anticipate repeat MBS next week to re-assess oropharyngeal swallow given continued participation in therapy and consent to proceed with study. Patient and family education is ongoing. Patient would benefit from continued skilled SLP intervention to maximize cognitive functioning and overall functional independence prior to discharge.   Intensity:   Frequency:   Duration/Length of Stay:   Treatment/Interventions:    Daily Session  Skilled Therapeutic Interventions: ***     General    Pain Pain Assessment Pain Scale: Faces Faces Pain Scale: Hurts a little bit  Therapy/Group: {Therapy/Group:[id number]}  Melvin Taylor T Melvin Taylor 09/07/2022, 12:49 PM

## 2022-09-07 NOTE — Progress Notes (Addendum)
Patient ID: Melvin Taylor, male   DOB: 14-Jan-1966, 57 y.o.   MRN: [id number]  Sw made attempt to call patient sister, [name]. Left VM, sw will continue to FU.   9:33am: Sw received a call from pt's sister. Sister unable to attend therapy sessions today but is available tomorrow to attend. Family education arranged 9a-12p.

## 2022-09-07 NOTE — Patient Care Conference (Signed)
Inpatient RehabilitationTeam Conference and Plan of Care Update Date: 09/07/2022   Time: 10:35 AM    Patient Name: Melvin Taylor      Medical Record Number: [id number]  Date of Birth: [birth year] Sex: Male         Room/Bed: 4W02C/4W02C-01 Payor Info: Payor: MEDICAID [place] / Plan: MEDICAID [place] ACCESS / Product Type: *No Product type* /    Admit Date/Time:  08/31/2022  4:47 PM  Primary Diagnosis:  Acute metabolic encephalopathy  Hospital Problems: Principal Problem:   Acute metabolic encephalopathy Active Problems:   Primary hypertension   Hyperlipidemia    Expected Discharge Date: Expected Discharge Date: 09/15/22  Team Members Present: Physician leading conference: Dr. Alysia Penna Social Worker Present: Erlene Quan, BSW Nurse Present: Dorien Chihuahua, RN PT Present: Page Spiro, PT OT Present: Other (comment) Lowella Fairy, OT) SLP Present: Sherren Kerns, SLP PPS Coordinator present : Gunnar Fusi, SLP     Current Status/Progress Goal Weekly Team Focus  Bowel/Bladder   pt is incontinent of b/b.   Regain continence   Assist with toileting q2-4hrs and prn    Swallow/Nutrition/ Hydration   dysphagia 2 diet with nectar thick liquids - mod A   sup A  pharyngeal strengthening exercises, PO trials, tolerance of current diet with implementation of swallowing safety strategies    ADL's   Max A U/LB dressing, max A toielting, UB bathing mod A, LB bathing max A, mod A stand>pivot to toilet/shower, grooming/heygine mod A, ;limited by decreased participation and motivaiton for therapy sessions and pain   supervision to min A (may need to down grade some BADL goals d/t requiring assistance at baseline; need to speak with family to confirm)   R-NMR, fucntional mobility training, transfer training, BADL re-education, d/c planning, balance training, activity tolerance, therapeutic use of self    Mobility   max assist bed mobility, min/mod assist sit<>stand and stand  pivot transfers, min assist gait up to 62f using L HHA - pt's with poor engagement in therapy   supervision overall ambulatory at household level - likely will need to downgrade goals to min assist, will wait for family educ tomorrow to determine most appropriate goals  pt participation, activity tolerance, dynamic standing balance, bed mobility training, transfer training, gait training, pt education - collaborating with SW to have daily family present to improve participation    Communication   max A - minimal participation   sup-to-min A (may consider discontinuing d/t near or at baseline per sister report and minimal participation since start of care)   functional communication, following simple commands    Safety/Cognition/ Behavioral Observations  max A - minimal participation   min A   orientation    Pain   no c/o pain   Remain pain free   Assess pain qshift    Skin   Skin intact   Maintain skin integrity  Assess skin qshift      Discharge Planning:  Discharging home with assistance from 2 sisters, uncle, niece and nephew. Patient lives alone but has a caregiver 50 hours a week and assistance from sisters. No steps to enter.   Team Discussion: Patient post metabolic encephalopathy with hx of strokes. Limited by post stroke frozen shoulder pain, chronic right hemiparesis, learned helplessness, poor motivation/engagement, and participation in therapy.  Patient on target to meet rehab goals: Currently needs max assist for toileting and dressing. Needs min - mod assist for sit- stand and stand pivito transfers but can ambulate up to 8-0'  HHA on the left.  Maintained on a D2 nectar diet; HS IVF.   *See Care Plan and progress notes for long and short-term goals.   Revisions to Treatment Plan:  Downgraded goals to min assist MBS pending; signs and symptoms of aspiration Discontinued communication goals   Teaching Needs: Safety, medications, transfers, toileting, etc.    Current Barriers to Discharge: Decreased caregiver support and Behavior  Possible Resolutions to Barriers: Family education HH follow up services IYM:[id number] confirmation of equipment at the home     Medical Summary Current Status: chronic right shoulder pain, hx of Left CVA and R HP, BPs with lability, cognitive slowing poor intake,  Barriers to Discharge: Medical stability   Possible Resolutions to Barriers/Weekly Focus: Right shoulder pain, reduced ROM , labile BP   Continued Need for Acute Rehabilitation Level of Care: The patient requires daily medical management by a physician with specialized training in physical medicine and rehabilitation for the following reasons: Direction of a multidisciplinary physical rehabilitation program to maximize functional independence : Yes Medical management of patient stability for increased activity during participation in an intensive rehabilitation regime.: Yes Analysis of laboratory values and/or radiology reports with any subsequent need for medication adjustment and/or medical intervention. : Yes   I attest that I was present, lead the team conference, and concur with the assessment and plan of the team.   Dorien Chihuahua B 09/07/2022, 2:27 PM

## 2022-09-07 NOTE — Progress Notes (Addendum)
Occupational Therapy Session Note  Patient Details  Name: Melvin Taylor MRN: [id number] Date of Birth: 01-23-1966  Today's Date: 09/07/2022 OT Individual Time: 0630-1601 OT Individual Time Calculation (min): 71 min    Short Term Goals: Week 1:  OT Short Term Goal 1 (Week 1): Pt will consistently complete toilet transfer min A sing LRAD OT Short Term Goal 2 (Week 1): Pt will utilize RUE during fucntional tasks ~50% of time with mod cueing OT Short Term Goal 3 (Week 1): Pt will maintain dynamic sitting balance CGA using LRAD OT Short Term Goal 4 (Week 1): Pt will sustain attention during BADL/functional task in quiet environment with mod cueing  Skilled Therapeutic Interventions/Progress Updates:     Pt received deeply sleeping in bed difficult to wake. Blinds open, lights turned on, and damp wash cloth applied to Pt face to increase arousal and alertness for therapy session. Pt opening eyes, however turning away from therapist stating "bed". Provided maximal motivation with Pt agreeing to therapy session with agreement to return to bed following session. Rn present at beginning of session to disconnect IV providing additional encouragement.   Pt required 10+ minutes to initiate transition OOB with gentle encouragement provided consistently. Pt eventually able to roll onto L side with min A and initiate lowering lefts off side of bed. Max cues required to initiate use of pushing through LUE.   Pt able to maintain sitting balance EOB supervision for TED hose and shoes to be donned total A for time management. Pt politely denying need for shower and bathroom this session. Pt requiring ++time to initiate standing and ambulation to wc with maximal gentle motivation provided from OT and nursing staff in/out during therapy session. Sit>stand and ambulation to wc with mod HHA for weight shifting/balance and mod cueing for technique and step length.   Transported total A to therapy gym in wc for time  management. Functional mobility training HHA min A with mod cueing for step length and weight shifting x2 trials with improved balance noted with practice.  Pt completed BUE ball pass activity with trunk rotation incorporated to increase RUE and core strength from BADL tasks. Pt complete 2x5 R/L trunk rotations with soccer ball and anteriorly weight shifting to pass ball to OT with mod verbal and tactile cueing required for technique.   Pt completed dynamic sitting balance activities with posterior and lateral reaching incorporated to simulate skills required for posterior peri-care and clothing management. Pt able to complete tasks with SBA and max motivation +time.   Transported back to room total A in wc. Pt stand pivot min HHA to EOB. Pt returned to bed with mod A to lift legs and position trunk. Pt was left resting in bed with call bell in reach, bed alarm on, and all needs met.   Therapy Documentation Precautions:  Precautions Precautions: Fall, Other (comment) Precaution Comments: R hemiplegia with R UE flexor tone and R LE extensor tone (LE tone primarily in the bed), Aphasia, delayed processing Required Braces or Orthoses: Other Brace Other Brace: R UE aircast for tone management Restrictions Weight Bearing Restrictions: No General:   Vital Signs: Therapy Vitals Temp: 97.6 F (36.4 C) Temp Source: Oral Pulse Rate: 62 Resp: 16 BP: 108/78 Patient Position (if appropriate): Lying Oxygen Therapy SpO2: 100 % O2 Device: Room Air Pain:   ADL: ADL Eating: Not assessed Grooming: Maximal assistance Where Assessed-Grooming: Edge of bed Upper Body Bathing: Moderate assistance Where Assessed-Upper Body Bathing: Edge of bed Lower Body Bathing:  Maximal assistance Where Assessed-Lower Body Bathing: Edge of bed Upper Body Dressing: Maximal assistance Where Assessed-Upper Body Dressing: Edge of bed Lower Body Dressing: Maximal assistance Where Assessed-Lower Body Dressing: Edge of  bed Toileting: Dependent Where Assessed-Toileting: Bed level Toilet Transfer: Moderate assistance Toilet Transfer Method: Sit pivot Toilet Transfer Equipment: Engineer, technical sales Transfer: Not assessed Social research officer, government: Moderate assistance Social research officer, government Method: Radiographer, therapeutic: Grab bars, Transfer tub bench   Therapy/Group: Individual Therapy  Janey Genta 09/07/2022, 3:41 PM

## 2022-09-08 LAB — GLUCOSE, CAPILLARY
Glucose-Capillary: 109 mg/dL — ABNORMAL HIGH (ref 70–99)
Glucose-Capillary: 97 mg/dL (ref 70–99)
Glucose-Capillary: 114 mg/dL — ABNORMAL HIGH (ref 70–99)
Glucose-Capillary: 116 mg/dL — ABNORMAL HIGH (ref 70–99)

## 2022-09-08 MED ORDER — LAMOTRIGINE 25 MG PO TABS
25.0000 mg | ORAL_TABLET | Freq: Every day | ORAL | Status: AC
  Administered 2022-09-08 – 2022-09-10 (×3): 25 mg via ORAL
  Filled 2022-09-08 (×3): qty 1

## 2022-09-08 MED ORDER — LAMOTRIGINE 25 MG PO TABS
25.0000 mg | ORAL_TABLET | Freq: Every day | ORAL | Status: DC
  Administered 2022-09-14: 25 mg via ORAL
  Filled 2022-09-08: qty 1

## 2022-09-08 MED ORDER — LAMOTRIGINE 25 MG PO TABS: 50.0000 mg | ORAL_TABLET | Freq: Two times a day (BID) | ORAL | Status: DC

## 2022-09-08 MED ORDER — INSULIN ASPART 100 UNIT/ML IJ SOLN: 0.0000 [IU] | Freq: Three times a day (TID) | INTRAMUSCULAR | Status: DC

## 2022-09-08 MED ORDER — LAMOTRIGINE 25 MG PO TABS
25.0000 mg | ORAL_TABLET | Freq: Two times a day (BID) | ORAL | Status: AC
  Administered 2022-09-11 – 2022-09-13 (×6): 25 mg via ORAL
  Filled 2022-09-08 (×6): qty 1

## 2022-09-08 MED ORDER — LAMOTRIGINE 25 MG PO TABS
50.0000 mg | ORAL_TABLET | Freq: Every day | ORAL | Status: DC
  Administered 2022-09-14 – 2022-09-15 (×2): 50 mg via ORAL
  Filled 2022-09-08 (×2): qty 2

## 2022-09-08 NOTE — Progress Notes (Signed)
Occupational Therapy Weekly Progress Note  Patient Details  Name: Melvin Taylor MRN: [id number] Date of Birth: 01/22/1966  Beginning of progress report period: August 01, 2022 End of progress report period: August 08, 2022  Today's Date: 09/08/2022 OT Individual Time: 1110-1204 OT Individual Time Calculation (min): 54 min    Patient has met 2 of 4 short term goals.  Pt is demonstrating improved sitting balance requiring SBA to CGA to maintain dynamic sitting balance during BADLs and functional activities. Pt is initiating use of RUE during functional activities ~50% of the time as tolerable d/t pain with min to mod cueing. Pt is progressing towards completing toilet transfers consistently using RW and sustaining attention on functional tasks in quiet environment. Pt is completing BADLs at the levels listed below. Pt is receiving support from his family who visits and inquires about Pt PLOC and progress often. Pt continues to require increased time to initiate engagement in therapy session, fatigue, and pain in RU/LEs impacting participation in therapy sessions.  Patient continues to demonstrate the following deficits: muscle weakness and muscle joint tightness, decreased cardiorespiratoy endurance, impaired timing and sequencing, abnormal tone, unbalanced muscle activation, decreased coordination, and decreased motor planning, decreased attention to right and decreased motor planning, decreased initiation, decreased attention, decreased awareness, decreased problem solving, decreased safety awareness, decreased memory, and delayed processing, and decreased sitting balance, decreased standing balance, decreased postural control, hemiplegia, and decreased balance strategies and therefore will continue to benefit from skilled OT intervention to enhance overall performance with BADL, iADL, and Reduce care partner burden.  Patient progressing toward long term goals..  Plan of care revisions: See updated  POC note for details. Goals downgraded d/t Pt current progress and functional status.   OT Short Term Goals Week 1:  OT Short Term Goal 1 (Week 1): Pt will consistently complete toilet transfer min A sing LRAD OT Short Term Goal 1 - Progress (Week 1): Progressing toward goal OT Short Term Goal 2 (Week 1): Pt will utilize RUE during fucntional tasks ~50% of time with mod cueing OT Short Term Goal 2 - Progress (Week 1): Met OT Short Term Goal 3 (Week 1): Pt will maintain dynamic sitting balance CGA using LRAD OT Short Term Goal 3 - Progress (Week 1): Met OT Short Term Goal 4 (Week 1): Pt will sustain attention during BADL/functional task in quiet environment with mod cueing OT Short Term Goal 4 - Progress (Week 1): Progressing toward goal Week 2:  OT Short Term Goal 1 (Week 2): STG=LTG d/t Pt ELOS  Skilled Therapeutic Interventions/Progress Updates:     Pt received sitting up in wc with PT exiting session. Sister present in room. Sister reporting Pt is presenting increasingly fatigued and drowsy compared to baseline with noted changes in medications compared to home medications. PT reporting she informed medical team prior to OT session. PA, Dan, in/out during session to provide education and address medication changes through collaboration with Pt and Pt sister, [name]. Nursing staff also in/out during session to gather blood sugar readings and nutritional shake with OT collaboration provided to support Pt in working with nursing staff.   Pt receptive to taking shower this session. Educated Pt on modified techniques (over the head) for doffing shirt requiring max A and cueing +time to remove shirt. Covered pt IV site to prepare for shower. Pt ambulated to bathroom min HHA with max motivation provide from OT and sister in order for Pt to initiate stand. Upon arriving to bathroom, Pt requested to  use toilet. Pt able to doff pants with mod A d/t tight waist band and to maintain balance. Pt provided ++time  on toilet d/t need to have BM (+B and B). Pt able to complete posterior peri-care in standing with min A for cleanliness. Brought pants to waist in standing with heavy mod A for balance. Shower not attempted this session d/t time constraints. Pt ambulated to sink mod a with cueing required for weight shifting and stepping with RLE. Pt fatigued following toileting washing hands in sitting for energy conservation.   Focused remainder of session on d/c planning with Pt sister, [name]. [name] reporting Pt will have 24/7 support upon d/c home between caregiver and family- uncle or caregiver will be present at night. Educated [name] that Pt is more willing to participate in therapy sessions when she is present encouraging frequent visits as possible.   Pt was handed off the nursing staff at end of session for eating assistance. Pt left rest in wc with call bell in reach, sister present in room, and all needs met.   Therapy Documentation Precautions:  Precautions Precautions: Fall, Other (comment) Precaution Comments: R hemiplegia with R UE flexor tone and R LE extensor tone (LE tone primarily in the bed), Aphasia, delayed processing Required Braces or Orthoses: Other Brace Other Brace: R UE aircast for tone management Restrictions Weight Bearing Restrictions: No General:   Vital Signs: Therapy Vitals Temp: 98.1 F (36.7 C) Pulse Rate: 60 Resp: 16 BP: 124/85 Patient Position (if appropriate): Lying Oxygen Therapy SpO2: 100 % O2 Device: Room Air Pain:   ADL: ADL Eating: Not assessed Grooming: Maximal assistance Where Assessed-Grooming: Edge of bed Upper Body Bathing: Moderate assistance Where Assessed-Upper Body Bathing: Edge of bed Lower Body Bathing: Maximal assistance Where Assessed-Lower Body Bathing: Edge of bed Upper Body Dressing: Maximal assistance Where Assessed-Upper Body Dressing: Edge of bed Lower Body Dressing: Maximal assistance Where Assessed-Lower Body Dressing: Edge of  bed Toileting: Dependent Where Assessed-Toileting: Bed level Toilet Transfer: Moderate assistance Toilet Transfer Method: Sit pivot Toilet Transfer Equipment: Engineer, technical sales Transfer: Not assessed Social research officer, government: Moderate assistance Social research officer, government Method: Radiographer, therapeutic: Grab bars, Transfer tub bench   Therapy/Group: Individual Therapy  Janey Genta 09/08/2022, 7:51 AM

## 2022-09-08 NOTE — Progress Notes (Signed)
Physical Therapy Weekly Progress Note  Patient Details  Name: Melvin Taylor MRN: [id number] Date of Birth: 05/05/1966  Beginning of progress report period: September 01, 2022 End of progress report period: September 08, 2022  Today's Date: 09/08/2022 PT Individual Time: 1007-1109 and 7035-0093 PT Individual Time Calculation (min): 62 min and 39 min  Patient has met 0 of 4 short term goals. Mr. [name] has made slower than anticipated progress primarily due to decreased participation in therapy sessions. [name], Melvin Taylor,"  present for therapy sessions on 2/8 to see pt's CLOF and provide pt motivation/encouragement to increase participation. She confirms pt will have 24hr support at D/C. Depending on pt's effort and energy levels he can perform supine<>sit with varying from min to max assist, sit<>stands and stand pivot transfers from min to mod assist, and participate in gait training up to 240f with min/mod assist. Based on pt's CLOF, fluctuations in mobility levels, progress so far, and participation levels his LTGs have been downgraded to min assist overall. Pt will benefit from continued CIR level skilled physical therapy to further advance his independence with functional mobility prior to D/Cing home.  Patient continues to demonstrate the following deficits muscle weakness and muscle joint tightness, decreased cardiorespiratoy endurance, impaired timing and sequencing, abnormal tone, unbalanced muscle activation, and decreased motor planning, decreased attention to right, decreased initiation, decreased attention, decreased awareness, decreased problem solving, decreased safety awareness, decreased memory, and delayed processing, and decreased sitting balance, decreased standing balance, decreased postural control, hemiplegia, and decreased balance strategies and therefore will continue to benefit from skilled PT intervention to increase functional independence with mobility.  Patient  not progressing toward long term goals.  See goal revision..  Continue plan of care.  PT Short Term Goals Week 1:  PT Short Term Goal 1 (Week 1): Pt will perform supine<>sit with CGA consistently PT Short Term Goal 1 - Progress (Week 1): Progressing toward goal PT Short Term Goal 2 (Week 1): Pt will perform sit<>stands using LRAD with CGA PT Short Term Goal 2 - Progress (Week 1): Progressing toward goal PT Short Term Goal 3 (Week 1): Pt will perform bed<>chair transfers using LRAD with CGA PT Short Term Goal 3 - Progress (Week 1): Progressing toward goal PT Short Term Goal 4 (Week 1): Pt will ambulate at least 563fusing LRAD with CGA PT Short Term Goal 4 - Progress (Week 1): Progressing toward goal Week 2:  PT Short Term Goal 1 (Week 2): = to LTGs based on ELOS  Skilled Therapeutic Interventions/Progress Updates:  Ambulation/gait training;Community reintegration;DME/adaptive equipment instruction;Neuromuscular re-education;Psychosocial support;Stair training;UE/LE Strength taining/ROM;Balance/vestibular training;Functional electrical stimulation;Discharge planning;Pain management;Skin care/wound management;UE/LE Coordination activities;Therapeutic Activities;Cognitive remediation/compensation;Disease management/prevention;Functional mobility training;Patient/family education;Splinting/orthotics;Therapeutic Exercise;Visual/perceptual remediation/compensation  Session 1: Pt received supine in bed with NT present assisting pt with peri-care and LB clothing management due to incontinent void and pt's sister, LiLattie Hawpresent. Pt agreeable to therapy session with focus on initiating family education. Therapist assumed care of pt. Therapist discussed pt's impaired processing and delayed initiation of mobility tasks as well as possible behavior contributing to pt's decreased participation in therapy sessions. Discussed possibility of having increased family presence during pt's CIR stay to help with increased  motivation to participate but family reports this may not be feasible. [name] did express concerns regarding pt's insulin and seizure medications - therapist notified medical team to address.  Sister reports that she is planning to increase hired caregiver support from 50hrs/week to 70hrs/week when pt D/Cs and that pt's Uncle  or caregiver will be staying with pt at night - she confirmed specifically that pt will have 24hr support.  Supine in bed, threaded on pants total assist, donned shoes total assist, and pt attempted to pull pants up over hips via bridge but unsuccessful therefore requiring total assist. Supine>sitting L EOB, HOB slightly elevated, with heavy min assist and significantly increased time/effort to bring trunk upright using L UE support. Sit>stand with mod assist for lifting to stand and balance due to posterior lean. R stand pivot EOB>w/c using L HHA with mod assist for balance due to posterior lean and therapist facilitating weight shifting to promote a step.    Transported to ADL apartment in w/c for time management and energy conservation.  Gait training ~80f in ADL apartment using L HHA with min assist, occasional mod assist when pt tripping over R toes causing LOB - pt continues to lack adequate L weight shift resulting in poor R LE foot clearance and step length as well as wider BOS and guarded upper body posturing.  Gait training ~2454ffrom ADL apartment back to his room with his sister providing w/c follow for safety and pt requiring 1x standing rest break - continues to require consistent min assist with facilitation for increased L weight shift onto stance limb to improve R LE swing phase advancement with the above gait deviations.  At end of session, pt left seated in w/c with needs in reach and OT present to assume care of pt.   Session 2: Pt received sitting in recliner with a blanket over his head. Pt initially irritated at therapist removing blanket from his  head and reports he is "really tired" initially refusing participation in session. Therapist provided education on importance of participation in therapy and reinforced his goals of being able to go home. Pt continues to very clearly state "no" in response to attempts at motivating him to participate. Called pt's sister AlElmo PuttLiLattie Hawand she spoke with pt and was able to motivate him to participate and he was agreeable to limited session after this.  Sit>stand recliner>L HHA with min assist for lifting to stand and balance due to posterior lean bias. Gait training ~1243fn room to w/c using L HHA with min assist for balance due to pt continuing to have posterior lean bias.  Transported to/from gym in w/c for time management and energy conservation. Gait training ~80f12fing L HHA with more consistent min assist at this time with pt having improved weight shifting, improved foot clearance bilaterally, and consistent reciprocal stepping pattern. Transported back to room and performed ~5ft 47fulatory transfer back to bed using L HHA with min assist. Sit>supine with min/mod assist for B LE management into bed and pt vocalizing out in discomfort/pain with this movement due to R LE hypertonia. Pt left supine in bed with needs in reach and bed alarm on.  Therapy Documentation Precautions:  Precautions Precautions: Fall, Other (comment) Precaution Comments: R hemiplegia with R UE flexor tone and R LE extensor tone (LE tone primarily in the bed), Aphasia, delayed processing Required Braces or Orthoses: Other Brace Other Brace: R UE aircast for tone management Restrictions Weight Bearing Restrictions: No   Pain:  Session 1: Pt continues to have moments of discomfort/pain in R hemibody when moving certain ways due to hypertonia.  Session 2: Pt continues to have moments of discomfort/pain in R hemibody when moving certain ways due to hypertonia.  Therapy/Group: Individual Therapy  Murice Barbar M PipFrancis Dowse, DPT,  NCS,  CSRS 09/08/2022, 7:44 AM

## 2022-09-08 NOTE — Plan of Care (Signed)
  Problem: RH Balance Goal: LTG Patient will maintain dynamic sitting balance (PT) Description: LTG:  Patient will maintain dynamic sitting balance with assistance during mobility activities (PT) Flowsheets (Taken 09/08/2022 1838) LTG: Pt will maintain dynamic sitting balance during mobility activities with:: (downgraded based on pt's progress) Contact Guard/Touching assist Note: downgraded based on pt's progress  Goal: LTG Patient will maintain dynamic standing balance (PT) Description: LTG:  Patient will maintain dynamic standing balance with assistance during mobility activities (PT) Flowsheets (Taken 09/08/2022 1838) LTG: Pt will maintain dynamic standing balance during mobility activities with:: (downgraded based on pt's progress) Minimal Assistance - Patient > 75% Note: downgraded based on pt's progress    Problem: Sit to Stand Goal: LTG:  Patient will perform sit to stand with assistance level (PT) Description: LTG:  Patient will perform sit to stand with assistance level (PT) Flowsheets (Taken 09/08/2022 1838) LTG: PT will perform sit to stand in preparation for functional mobility with assistance level: (downgraded based on pt's progress) Minimal Assistance - Patient > 75% Note: downgraded based on pt's progress    Problem: RH Bed Mobility Goal: LTG Patient will perform bed mobility with assist (PT) Description: LTG: Patient will perform bed mobility with assistance, with/without cues (PT). Flowsheets (Taken 09/08/2022 1838) LTG: Pt will perform bed mobility with assistance level of: (downgraded based on pt's progress) Minimal Assistance - Patient > 75% Note: downgraded based on pt's progress    Problem: RH Bed to Chair Transfers Goal: LTG Patient will perform bed/chair transfers w/assist (PT) Description: LTG: Patient will perform bed to chair transfers with assistance (PT). Flowsheets (Taken 09/08/2022 1838) LTG: Pt will perform Bed to Chair Transfers with assistance level: (downgraded  based on pt's progress) Minimal Assistance - Patient > 75% Note: downgraded based on pt's progress    Problem: RH Car Transfers Goal: LTG Patient will perform car transfers with assist (PT) Description: LTG: Patient will perform car transfers with assistance (PT). Flowsheets (Taken 09/08/2022 1838) LTG: Pt will perform car transfers with assist:: (downgraded based on pt's progress) Minimal Assistance - Patient > 75% Note: downgraded based on pt's progress    Problem: RH Ambulation Goal: LTG Patient will ambulate in controlled environment (PT) Description: LTG: Patient will ambulate in a controlled environment, # of feet with assistance (PT). Flowsheets (Taken 09/08/2022 1838) LTG: Pt will ambulate in controlled environ  assist needed:: (downgraded based on pt's progress) Minimal Assistance - Patient > 75% LTG: Ambulation distance in controlled environment: 161f using LRAD Note: downgraded based on pt's progress  Goal: LTG Patient will ambulate in home environment (PT) Description: LTG: Patient will ambulate in home environment, # of feet with assistance (PT). Flowsheets (Taken 09/08/2022 1838) LTG: Pt will ambulate in home environ  assist needed:: (downgraded based on pt's progress) Minimal Assistance - Patient > 75% LTG: Ambulation distance in home environment: 515fusing LRAD Note: downgraded based on pt's progress

## 2022-09-08 NOTE — Progress Notes (Signed)
Patient ID: Melvin Taylor, male   DOB: 1966/03/20, 57 y.o.   MRN: [id number]  Team Conference Report to Patient/Family  Team Conference discussion was reviewed with the patient and caregiver, including goals, any changes in plan of care and target discharge date.  Patient and caregiver express understanding and are in agreement.  The patient has a target discharge date of 09/15/22.   SW met with patient and called sister a bedside. Patient prefers to discharge home sooner. Sister will be present for family education today to determine if she feel comfortable taking patient home sooner. Sw will discuss with team. No additional questions or concerns. Dyanne Iha 09/08/2022, 9:24 AM

## 2022-09-08 NOTE — Progress Notes (Signed)
Was called to the room to discuss patient's bouts of lethargy.  Sister is at bedside who is POA.  After strong review of medications from outside hospital and discussion with neurology.  Patient's Keppra will be discontinued his home Lamictal dose was resumed and he would continue Depakote.  Also regards to blood sugars patient's Lantus insulin was discontinued in August by PCP due to low blood sugars.  He was using glimepiride only if blood sugar was greater than 150.

## 2022-09-08 NOTE — Plan of Care (Signed)
  Problem: RH Balance Goal: LTG Patient will maintain dynamic standing with ADLs (OT) Description: LTG:  Patient will maintain dynamic standing balance with assist during activities of daily living (OT)  Flowsheets (Taken 09/08/2022 1609) LTG: Pt will maintain dynamic standing balance during ADLs with: Minimal Assistance - Patient > 75%   Problem: Sit to Stand Goal: LTG:  Patient will perform sit to stand in prep for activites of daily living with assistance level (OT) Description: LTG:  Patient will perform sit to stand in prep for activites of daily living with assistance level (OT) Flowsheets (Taken 09/08/2022 1609) LTG: PT will perform sit to stand in prep for activites of daily living with assistance level: Contact Guard/Touching assist Note: Downgraded d/t pt current progress and functional status.    Problem: RH Eating Goal: LTG Patient will perform eating w/assist, cues/equip (OT) Description: LTG: Patient will perform eating with assist, with/without cues using equipment (OT) Flowsheets (Taken 09/08/2022 1609) LTG: Pt will perform eating with assistance level of: Minimal Assistance - Patient > 75% Note: Downgraded d/t pt current progress and functional status.    Problem: RH Grooming Goal: LTG Patient will perform grooming w/assist,cues/equip (OT) Description: LTG: Patient will perform grooming with assist, with/without cues using equipment (OT) Flowsheets (Taken 09/08/2022 1609) LTG: Pt will perform grooming with assistance level of: Minimal Assistance - Patient > 75% Note: Downgraded d/t pt current progress and functional status.    Problem: RH Dressing Goal: LTG Patient will perform upper body dressing (OT) Description: LTG Patient will perform upper body dressing with assist, with/without cues (OT). Flowsheets (Taken 09/08/2022 1609) LTG: Pt will perform upper body dressing with assistance level of: Minimal Assistance - Patient > 75% Note: Downgraded d/t pt current progress and  functional status.  Goal: LTG Patient will perform lower body dressing w/assist (OT) Description: LTG: Patient will perform lower body dressing with assist, with/without cues in positioning using equipment (OT) Flowsheets (Taken 09/08/2022 1609) LTG: Pt will perform lower body dressing with assistance level of: Minimal Assistance - Patient > 75% Note: Downgraded d/t pt current progress and functional status.    Problem: RH Toileting Goal: LTG Patient will perform toileting task (3/3 steps) with assistance level (OT) Description: LTG: Patient will perform toileting task (3/3 steps) with assistance level (OT)  Flowsheets (Taken 09/08/2022 1609) LTG: Pt will perform toileting task (3/3 steps) with assistance level: Minimal Assistance - Patient > 75% Note: Downgraded d/t pt current progress and functional status.    Problem: RH Toilet Transfers Goal: LTG Patient will perform toilet transfers w/assist (OT) Description: LTG: Patient will perform toilet transfers with assist, with/without cues using equipment (OT) Flowsheets (Taken 09/08/2022 1609) LTG: Pt will perform toilet transfers with assistance level of: Minimal Assistance - Patient > 75% Note: Downgraded d/t pt current progress and functional status.    Problem: RH Tub/Shower Transfers Goal: LTG Patient will perform tub/shower transfers w/assist (OT) Description: LTG: Patient will perform tub/shower transfers with assist, with/without cues using equipment (OT) Flowsheets (Taken 09/08/2022 1609) LTG: Pt will perform tub/shower stall transfers with assistance level of: Minimal Assistance - Patient > 75% Note: Downgraded d/t pt current progress and functional status.

## 2022-09-08 NOTE — Plan of Care (Signed)
Problem: RH Cognition - SLP Goal: RH LTG Patient will demonstrate orientation with cues Description:  LTG:  Patient will demonstrate orientation to person/place/time/situation with cues (SLP)   Outcome: Not Applicable   Problem: RH Comprehension Communication Goal: LTG Patient will comprehend basic/complex auditory (SLP) Description: LTG: Patient will comprehend basic/complex auditory information with cues (SLP). Outcome: Not Applicable   Problem: RH Expression Communication Goal: LTG Patient will express needs/wants via multi-modal(SLP) Description: LTG:  Patient will express needs/wants via multi-modal communication (gestures/written, etc) with cues (SLP) Outcome: Not Applicable Goal: LTG Patient will verbally express basic/complex needs(SLP) Description: LTG:  Patient will verbally express basic/complex needs, wants or ideas with cues  (SLP) Outcome: Not Applicable   Problem: RH Swallowing Goal: LTG Patient will participate in dysphagia therapy to increase swallow function with assistance (SLP) Description: LTG:  Patient will participate in dysphagia therapy to increase swallow function with assistance (SLP) Flowsheets (Taken 09/08/2022 1237) LTG: Pt will participate in dysphagia therapy to increase swallow function with assistance of (SLP): Minimal Assistance - Patient > 75%

## 2022-09-08 NOTE — Progress Notes (Signed)
PROGRESS NOTE   Subjective/Complaints:  Pt awake but with delayed responses.  Slowly eating breakfast with SLP, (couple bites in 20mn) voices no c/os       09/08/2022    8:20 AM 09/08/2022    4:26 AM 09/07/2022    7:35 PM  Vitals with BMI  Systolic [id number] Diastolic 84 85 77  Pulse 62 60 62    ROS- cannot obtain due to cognitive deficits  Objective:   No results found. No results for input(s): "WBC", "HGB", "HCT", "PLT" in the last 72 hours.  Recent Labs    09/06/22 1954 09/07/22 0718  NA 135 135  K 4.6 4.7  CL 108 107  CO2 23 20*  GLUCOSE 79 115*  BUN 19 17  CREATININE 1.22 1.12  CALCIUM 8.3* 8.1*     Intake/Output Summary (Last 24 hours) at 09/08/2022 0923 Last data filed at 09/08/2022 0811 Gross per 24 hour  Intake 594 ml  Output --  Net 594 ml         Physical Exam: Vital Signs Blood pressure 124/84, pulse 62, temperature 98.1 F (36.7 C), resp. rate 16, height '5\' 5"'$  (1.651 m), weight 53.2 kg, SpO2 100 %.   General: No acute distress. Lying in bed with blanket covering his face Heart: Regular rate and rhythm no rubs murmurs or extra sounds Lungs: Clear to auscultation, breathing unlabored, no rales or wheezes Abdomen: Positive bowel sounds, soft nontender to palpation, nondistended Extremities: No clubbing, cyanosis, or edema Skin: No evidence of breakdown, no evidence of rash Neurologic: Moving all 4 extremities in bed, antigravity and against resistance. Oriented to self and year with cues. Delayed responses.  Can follow simple commands.  Psych: Mildly impulsive, flat affect   Prior exams: Cranial nerves II through XII intact, motor strength is 4/5 in bilateral , bicep, tricep, grip, hip flexor, knee extensors, ankle dorsiflexor and plantar flexor, left delt RIght deltoid MMT but now able to elevate and abd to 90 deg (improved)    Musculoskeletal:RIght shoulder pain limits abd and  flexion , also limited ROM with ER, pain to palp Right wrist no swelling erythema or deformity (? Hyperalgesia  CVA related) no elow  deformity , mild tenderness R acromian   Assessment/Plan: 1. Functional deficits which require 3+ hours per day of interdisciplinary therapy in a comprehensive inpatient rehab setting. Physiatrist is providing close team supervision and 24 hour management of active medical problems listed below. Physiatrist and rehab team continue to assess barriers to discharge/monitor patient progress toward functional and medical goals  Care Tool:  Bathing    Body parts bathed by patient: Chest, Abdomen, Right upper leg, Left upper leg, Face   Body parts bathed by helper: Front perineal area, Buttocks, Left lower leg, Right lower leg, Left arm, Right arm     Bathing assist Assist Level: Maximal Assistance - Patient 24 - 49%     Upper Body Dressing/Undressing Upper body dressing   What is the patient wearing?: Pull over shirt    Upper body assist Assist Level: Maximal Assistance - Patient 25 - 49%    Lower Body Dressing/Undressing Lower body dressing  What is the patient wearing?: Underwear/pull up, Pants     Lower body assist Assist for lower body dressing: Maximal Assistance - Patient 25 - 49%     Toileting Toileting    Toileting assist Assist for toileting: Dependent - Patient 0%     Transfers Chair/bed transfer  Transfers assist     Chair/bed transfer assist level: Moderate Assistance - Patient 50 - 74% Chair/bed transfer assistive device:  (HHA)   Locomotion Ambulation   Ambulation assist      Assist level: Minimal Assistance - Patient > 75% Assistive device: Hand held assist Max distance: 38f   Walk 10 feet activity   Assist     Assist level: 2 helpers Assistive device: Hand held assist   Walk 50 feet activity   Assist Walk 50 feet with 2 turns activity did not occur: Safety/medical concerns         Walk 150  feet activity   Assist Walk 150 feet activity did not occur: Safety/medical concerns         Walk 10 feet on uneven surface  activity   Assist Walk 10 feet on uneven surfaces activity did not occur: Safety/medical concerns         Wheelchair     Assist Is the patient using a wheelchair?: Yes (for transport) Type of Wheelchair: Manual    Wheelchair assist level: Dependent - Patient 0%      Wheelchair 50 feet with 2 turns activity    Assist        Assist Level: Dependent - Patient 0%   Wheelchair 150 feet activity     Assist      Assist Level: Dependent - Patient 0%   Blood pressure 124/84, pulse 62, temperature 98.1 F (36.7 C), resp. rate 16, height '5\' 5"'$  (1.651 m), weight 53.2 kg, SpO2 100 %.  Medical Problem List and Plan: 1. Functional deficits secondary to acute metabolic encephalopathy.  Stable trace subdural hematoma along the posterior falx and posterior occipital lobe.  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  Received inpatient rehab services 12/21/2020 - 12/30/2020 after CVA             -patient may shower             -ELOS/Goals: 09/15/2022 Min A level             -Continue CIR,  PT OT and SLP    2.  Antithrombotics: -DVT/anticoagulation:  Mechanical:  Antiembolism stockings, knee (TED hose) Bilateral lower extremities             -antiplatelet therapy: Brilinta 90 mg twice daily 3. 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 Mood/Behavior/Sleep: Provide emotional support.  Valium 5 mg  daily Lexapro 20 mg daily, Depakote 500 mg twice daily             -antipsychotic agents: N/A Trial modafanil during inpt stay will d/c when he goes home 5. Neuropsych/cognition: This patient is not capable of making decisions on  his own behalf. 6. Skin/Wound Care: Routine skin checks 7. Fluids/Electrolytes/Nutrition: Routine in and outs with follow-up chemistries  -2/4: See BMP results above; Na 133->134, K 5.7->5.5, and Cr 1.64->1.53 with 1 L IVF yesterday. Continue today, due to hypoglycemia switching to D5 1/2NS 1L overnight. Encouraged PO fluids. AM labs.  - Calorie counts, dietary consult placed to see if  diet can be further optimized to reduce hypoglycemic events 2/5 will order lokelma for K+ 5.8, give 10g lokelma  2/6 K+ continues to be elevated, increase IVF to177m/hr, change ensure to nepro, discussed with pharmacy-no meds likely contributing, lokelma TID for 3 doses planned -Addendum K+ down to 4.6, will hold off on further lokelma, recheck tomorrow  Stable 2/7  8.  Seizure disorder/epilepsy.  EEG negative.  Keppra 1000 mg every 12 hours, Depakote 500 mg twice a day  9.  Diabetes mellitus.  Currently SSI.  Patient on Lantus insulin 40 units nightly and Amaryl 2 mg daily prior to admission.  Resume as needed.  CBG (last 3)  Recent Labs    09/07/22 1638 09/07/22 2205 09/08/22 0622  GLUCAP 94 143* 109*    10.  Hyperlipidemia.  Lipitor '80mg'$  daily 11.  CAD with CABG 2017.  Continue Imdur 30 mg daily.  No chest pain or shortness of breath. 12.  History of hypertension.  Patient on Norvasc 10 mg daily, Lopressor 12.5 mg twice daily, lisinopril 5 mg daily, BP  Vitals:   09/08/22 0426 09/08/22 0820  BP: 124/85 124/84  Pulse: 60 62  Resp: 16   Temp: 98.1 F (36.7 C)   SpO2: 100%     2/3: Hypotensive, bradycardia, low intakes as above. Hold norvasc 2/4: Losartan Dced d/t hyperkalemia, AKI. Remains hypotensive today; may need to adjust CHF medications next if symptomatic.   2/6 well controlled  13.  COVID-19.  Resolved.  Precautions have been discontinued 14. Hyponatremic, Na 129  -2/5, was given D5 1/2 NS last night, will start D5NS 518mhr  -2/6 Na improved to 135 today 15. AKI  -2/5 Start IVF  2/6 improving, increase IVF [id number]      Latest Ref Rng & Units 09/07/2022    7:18 AM 09/06/2022    7:54 PM 09/06/2022   10:44 AM  BMP  Glucose 70 - 99 mg/dL 115  79  80   BUN 6 - 20 mg/dL '17  19  18   '$ Creatinine 0.61 - 1.24 mg/dL 1.12  1.22  1.32   Sodium 135 - 145 mmol/L 135  135  135   Potassium 3.5 - 5.1 mmol/L 4.7  4.6  6.1   Chloride 98 - 111 mmol/L 107  108  105   CO2 22 - 32 mmol/L '20  23  23   '$ Calcium 8.9 - 10.3 mg/dL 8.1  8.3  8.7      LOS: 8 days A FACE TO FACE EVALUATION WAS PERFORMED  AndCharlett Blake8/2024, 9:23 AM

## 2022-09-08 NOTE — Progress Notes (Signed)
Patient ID: Melvin Taylor, male   DOB: 1965/09/21, 57 y.o.   MRN: [id number]  Family education with patient sister, [name] on Tuesday 2/70.

## 2022-09-09 DIAGNOSIS — F209 Schizophrenia, unspecified: Secondary | ICD-10-CM

## 2022-09-09 DIAGNOSIS — F0630 Mood disorder due to known physiological condition, unspecified: Secondary | ICD-10-CM

## 2022-09-09 LAB — GLUCOSE, CAPILLARY
Glucose-Capillary: 58 mg/dL — ABNORMAL LOW (ref 70–99)
Glucose-Capillary: 61 mg/dL — ABNORMAL LOW (ref 70–99)
Glucose-Capillary: 66 mg/dL — ABNORMAL LOW (ref 70–99)
Glucose-Capillary: 80 mg/dL (ref 70–99)
Glucose-Capillary: 117 mg/dL — ABNORMAL HIGH (ref 70–99)
Glucose-Capillary: 71 mg/dL (ref 70–99)
Glucose-Capillary: 132 mg/dL — ABNORMAL HIGH (ref 70–99)

## 2022-09-09 LAB — BASIC METABOLIC PANEL
Anion gap: 9 (ref 5–15)
BUN: 15 mg/dL (ref 6–20)
CO2: 23 mmol/L (ref 22–32)
Calcium: 8.5 mg/dL — ABNORMAL LOW (ref 8.9–10.3)
Chloride: 104 mmol/L (ref 98–111)
Creatinine, Ser: 1.11 mg/dL (ref 0.61–1.24)
GFR, Estimated: >60 mL/min (ref >60)
Glucose, Bld: 60 mg/dL — ABNORMAL LOW (ref 70–99)
Potassium: 4.4 mmol/L (ref 3.5–5.1)
Sodium: 136 mmol/L (ref 135–145)

## 2022-09-09 NOTE — Progress Notes (Signed)
Hypoglycemic Event  CBG: 66  Treatment: 4 oz juice/soda  Symptoms: None  Follow-up CBG: [id number] CBG Result:80  Possible Reasons for Event: Unknown  Comments/MD notified:Hypoglycemic Protocol done.    Melvin Taylor

## 2022-09-09 NOTE — Progress Notes (Signed)
Hypoglycemic Event  CBG: 58  Treatment: 4 oz juice/soda  Symptoms: None  Follow-up CBG: [id number] CBG Result:61  Possible Reasons for Event: Unknown  Comments/MD notified:Hypoglycemic Protocol initiated.     Melvin Taylor

## 2022-09-09 NOTE — Progress Notes (Signed)
Physical Therapy Session Note  Patient Details  Name: TYKESE SALERNO MRN: [id number] Date of Birth: 01-31-1966  Today's Date: 09/09/2022 PT Individual Time: 1121-1201 PT Individual Time Calculation (min): 40 min   Short Term Goals: Week 1:  PT Short Term Goal 1 (Week 1): Pt will perform supine<>sit with CGA consistently PT Short Term Goal 1 - Progress (Week 1): Progressing toward goal PT Short Term Goal 2 (Week 1): Pt will perform sit<>stands using LRAD with CGA PT Short Term Goal 2 - Progress (Week 1): Progressing toward goal PT Short Term Goal 3 (Week 1): Pt will perform bed<>chair transfers using LRAD with CGA PT Short Term Goal 3 - Progress (Week 1): Progressing toward goal PT Short Term Goal 4 (Week 1): Pt will ambulate at least 51f using LRAD with CGA PT Short Term Goal 4 - Progress (Week 1): Progressing toward goal  Skilled Therapeutic Interventions/Progress Updates:    Pt received asleep supine in bed was able to awake when therapist entered and began speaking, however, demonstrated delayed arousal, but agreeable to therapy. Nurse tech entered to check pt's glucose levels.   Pt Mod A w/ bed mobility. Pt transferred supine to sitting at EOB. Pt demonstrated decreased willingness to participate. Mod A for transfer to sitting with therapist assisting at trunk and LE. In sitting, pt demonstrated decreased trunk control. Pt was instructed to hold on to bed rail to maintain sitting balance. Pt requested to change clothes. Therapist donned clothing Max A. Pt experienced a seemingly RUE spasm when donning sweatshirt which resulted in a verbalization of pain. Took about 30 seconds for pain to decrease. Sit to stand transfer to pull pants up. Pt required Mod A to transfer and mod vc to bring shoulders forward. Pt exhibited continued lack of trunk control when sitting. Therapist providing Mod A at upper trunk to maintain upright sitting. Pt was able to remain upright w/ supervision, however  infrequent during session. Due to lack of time, session remained in pt room.   Gait training: ~20 ft w/ RW and Mod a for wt shifts to the R to clear L foot and Mod A for managing RW. Pt demonstrated decreased B step length and showed little response when cued to increase step length.   Pt left supine in bed and required Mod A w/ LE management and required Max A x2 [facility] on, call bell in reach and all needs met.   Therapy Documentation Precautions:  Precautions Precautions: Fall, Other (comment) Precaution Comments: R hemiplegia with R UE flexor tone and R LE extensor tone (LE tone primarily in the bed), Aphasia, delayed processing Required Braces or Orthoses: Other Brace Other Brace: R UE aircast for tone management Restrictions Weight Bearing Restrictions: No General:   Pain: Verbalized pain at RUE when donning sweatshirt         Therapy/Group: Individual Therapy  Kamryn Messineo 09/09/2022, 2:59 PM

## 2022-09-09 NOTE — Progress Notes (Signed)
Nutrition Follow-up  DOCUMENTATION CODES:   Severe malnutrition in context of social or environmental circumstances  INTERVENTION:  Encourage adequate PO intake Discontinue Nepro Shakes Magic cup TID with meals, each supplement provides 290 kcal and 9 grams of protein Increase snacks from once daily to TID  NUTRITION DIAGNOSIS:   Severe Malnutrition related to social / environmental circumstances as evidenced by severe fat depletion, severe muscle depletion. - remains applicable  GOAL:  Patient will meet greater than or equal to 90% of their needs - goal   MONITOR:   PO intake, Supplement acceptance, Labs, Weight trends, I & O's  REASON FOR ASSESSMENT:   Consult Other (Comment) (Diabetic snacks on D2 diet; used to eating q 2 hours to keep CBGs up)  ASSESSMENT:   57 year old male with PMHx of DM type 2, CAD s/p CABG, gout, HTN, CVA, polysubstance abuse who is currently admitted to inpatient rehab with functional deficits secondary to acute metabolic encephalopathy.  Calorie count completed on 2/7. Pt eating >100% of his calorie and protein needs. Pt consumed 25% of breakfast this morning and 75% of lunch. He really enjoys magic cup but is not drinking the Nepro shakes. Consult received to add snacks between meals as pt reports eating every 2 hours to help with blood sugar control.  Attempted to speak with pt at bedside to obtain snack preferences however pt with blanket over his head sleeping and would not speak to RD. Nightly snacks already ordered in [employer] ordering system. Added 10a and 2p snacks. Discussed with RN.   Medications: ferrous sulfate, SSI 0-6 units TID, MVI  Labs reviewed  CBG's 117, 80, 66, 61, 58 x24 hours  Diet Order:   Diet Order             DIET DYS 2 Room service appropriate? No; Fluid consistency: Nectar Thick  Diet effective now                   EDUCATION NEEDS:   Not appropriate for education at this time  Skin:  Skin  Assessment: Reviewed RN Assessment  Last BM:  2/8- type 6  Height:   Ht Readings from Last 1 Encounters:  08/31/22 5' 5"$  (1.651 m)    Weight:   Wt Readings from Last 1 Encounters:  08/31/22 53.2 kg    Ideal Body Weight:  61.8 kg  BMI:  Body mass index is 19.52 kg/m.  Estimated Nutritional Needs:   Kcal:  1800-2000  Protein:  90-100 grams  Fluid:  1.8-2 L/day  Clayborne Dana, RDN, LDN Clinical Nutrition

## 2022-09-09 NOTE — Progress Notes (Addendum)
PROGRESS NOTE   Subjective/Complaints:  More alert today , still with poor intake Discussed med changes with SLP      09/08/2022    7:39 PM 09/08/2022    1:18 PM 09/08/2022    8:20 AM  Vitals with BMI  Systolic [id number]  Diastolic 75 67 84  Pulse 64 56 62    ROS- cannot obtain due to cognitive deficits  Objective:   No results found. No results for input(s): "WBC", "HGB", "HCT", "PLT" in the last 72 hours.  Recent Labs    09/07/22 0718 09/09/22 0529  NA 135 136  K 4.7 4.4  CL 107 104  CO2 20* 23  GLUCOSE 115* 60*  BUN 17 15  CREATININE 1.12 1.11  CALCIUM 8.1* 8.5*     Intake/Output Summary (Last 24 hours) at 09/09/2022 0841 Last data filed at 09/09/2022 [id number] Gross per 24 hour  Intake 744 ml  Output --  Net 744 ml         Physical Exam: Vital Signs Blood pressure 114/75, pulse 64, temperature 98.2 F (36.8 C), temperature source Oral, resp. rate 16, height 5' 5"$  (1.651 m), weight 53.2 kg, SpO2 99 %.   General: No acute distress. Lying in bed with blanket covering his face Heart: Regular rate and rhythm no rubs murmurs or extra sounds Lungs: Clear to auscultation, breathing unlabored, no rales or wheezes Abdomen: Positive bowel sounds, soft nontender to palpation, nondistended Extremities: No clubbing, cyanosis, or edema Skin: No evidence of breakdown, no evidence of rash Neurologic: Moving all 4 extremities in bed, antigravity and against resistance. Oriented to self and year with cues. Delayed responses.  Can follow simple commands.  Psych: Mildly impulsive, flat affect   Prior exams: Cranial nerves II through XII intact, motor strength is 4/5 in left 3/5 Right , bicep, tricep, grip, hip flexor, knee extensors, ankle dorsiflexor and plantar flexor, left delt RIght deltoid MMT but now able to elevate and abd to 90 deg (improved)    Musculoskeletal:RIght shoulder pain limits abd and flexion ,  also limited ROM with ER, pain to palp Right wrist no swelling erythema or deformity (? Hyperalgesia  CVA related) no elow  deformity , mild tenderness R acromian   Assessment/Plan: 1. Functional deficits which require 3+ hours per day of interdisciplinary therapy in a comprehensive inpatient rehab setting. Physiatrist is providing close team supervision and 24 hour management of active medical problems listed below. Physiatrist and rehab team continue to assess barriers to discharge/monitor patient progress toward functional and medical goals  Care Tool:  Bathing    Body parts bathed by patient: Chest, Abdomen, Right upper leg, Left upper leg, Face   Body parts bathed by helper: Front perineal area, Buttocks, Left lower leg, Right lower leg, Left arm, Right arm     Bathing assist Assist Level: Maximal Assistance - Patient 24 - 49%     Upper Body Dressing/Undressing Upper body dressing   What is the patient wearing?: Pull over shirt    Upper body assist Assist Level: Maximal Assistance - Patient 25 - 49%    Lower Body Dressing/Undressing Lower body dressing      What  is the patient wearing?: Underwear/pull up, Pants     Lower body assist Assist for lower body dressing: Maximal Assistance - Patient 25 - 49%     Toileting Toileting    Toileting assist Assist for toileting: Dependent - Patient 0%     Transfers Chair/bed transfer  Transfers assist     Chair/bed transfer assist level: Minimal Assistance - Patient > 75% Chair/bed transfer assistive device:  (HHA)   Locomotion Ambulation   Ambulation assist      Assist level: Minimal Assistance - Patient > 75% Assistive device: Hand held assist Max distance: 77f   Walk 10 feet activity   Assist     Assist level: 2 helpers Assistive device: Hand held assist   Walk 50 feet activity   Assist Walk 50 feet with 2 turns activity did not occur: Safety/medical concerns         Walk 150 feet  activity   Assist Walk 150 feet activity did not occur: Safety/medical concerns         Walk 10 feet on uneven surface  activity   Assist Walk 10 feet on uneven surfaces activity did not occur: Safety/medical concerns         Wheelchair     Assist Is the patient using a wheelchair?: Yes (for transport) Type of Wheelchair: Manual    Wheelchair assist level: Dependent - Patient 0%      Wheelchair 50 feet with 2 turns activity    Assist        Assist Level: Dependent - Patient 0%   Wheelchair 150 feet activity     Assist      Assist Level: Dependent - Patient 0%   Blood pressure 114/75, pulse 64, temperature 98.2 F (36.8 C), temperature source Oral, resp. rate 16, height 5' 5"$  (1.651 m), weight 53.2 kg, SpO2 99 %.  Medical Problem List and Plan: 1. Functional deficits secondary to acute metabolic encephalopathy.  Stable trace subdural hematoma along the posterior falx and posterior occipital lobe.  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  Received inpatient rehab services 12/21/2020 - 12/30/2020 after CVA             -patient may shower             -ELOS/Goals: 09/15/2022 Min A level             -Continue CIR,  PT OT and SLP    2.  Antithrombotics: -DVT/anticoagulation:  Mechanical:  Antiembolism stockings, knee (TED hose) Bilateral lower extremities             -antiplatelet therapy: Brilinta 90 mg twice daily 3. 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 Mood/Behavior/Sleep: more leathargic since  admit, will d/c   Valium( not a home med), D/C Keppra, restart lamictal after review of home meds with pt's sister (POA) and neuro  Trial modafanil during inpt stay will d/c when he goes home 5. Neuropsych/cognition: This patient is not capable of making decisions on his own  behalf. 6. Skin/Wound Care: Routine skin checks 7. Fluids/Electrolytes/Nutrition: Routine in and outs with follow-up chemistries  -2/4: See BMP results above; Na 133->134, K 5.7->5.5, and Cr 1.64->1.53 with 1 L IVF yesterday. Continue today, due to hypoglycemia switching to D5 1/2NS 1L overnight. Encouraged PO fluids. AM labs.  - Calorie counts, dietary consult placed to see if  diet can be further optimized to reduce hypoglycemic events 2/5 will order lokelma for K+ 5.8, give 10g lokelma  2/6 K+ continues to be elevated, increase IVF to111m/hr, change ensure to nepro, discussed with pharmacy-no meds likely contributing, lokelma TID for 3 doses planned -Addendum K+ down to 4.6, will hold off on further lokelma, recheck tomorrow  Normal BMET 2/9   8.  Seizure disorder/epilepsy.  EEG negative.  Keppra 1000 mg every 12 hours, Depakote 500 mg twice a day  9.  Diabetes mellitus.  Currently SSI.  per sister not on Lantus at home  CBG (last 3)  Recent Labs    09/09/22 0549 09/09/22 0605 09/09/22 0616  GLUCAP 61* 66* 80   No on hypoglycemic agents, enc po intake  10.  Hyperlipidemia.  Lipitor 860mdaily 11.  CAD with CABG 2017.  Continue Imdur 30 mg daily.  No chest pain or shortness of breath. 12.  History of hypertension.  Patient on Norvasc 10 mg daily, Lopressor 12.5 mg twice daily, lisinopril 5 mg daily, Vitals:   09/08/22 1318 09/08/22 1939  BP: 105/67 114/75  Pulse: (!) 56 64  Resp: 18 16  Temp: 97.6 F (36.4 C) 98.2 F (36.8 C)  SpO2: 100% 99%      13.  COVID-19.  Resolved.  Precautions have been discontinued 14. Hyponatremic, Na 129  -2/5, was given D5 1/2 NS last night, will start D5NS 5057mr  -2/6  Na improved to 135 today 15. AKI  -2/5 Start IVF  2/6 improving, increase IVF to100m14m      Latest Ref Rng & Units 09/09/2022    5:29 AM 09/07/2022    7:18 AM 09/06/2022    7:54 PM  BMP  Glucose 70 - 99 mg/dL 60  115  79   BUN 6 - 20 mg/dL 15  17  19   $ Creatinine 0.61 - 1.24 mg/dL 1.11  1.12  1.22   Sodium 135 - 145 mmol/L 136  135  135   Potassium 3.5 - 5.1 mmol/L 4.4  4.7  4.6   Chloride 98 - 111 mmol/L 104  107  108   CO2 22 - 32 mmol/L 23  20  23   $ Calcium 8.9 - 10.3 mg/dL 8.5  8.1  8.3    2/9 BMET normal off IVF, monitor intake   LOS: 9 days A FACE TO FACE EVALUATION WAS PERFORMED  AndrCharlett Blake/2024, 8:41 AM

## 2022-09-09 NOTE — Progress Notes (Signed)
Occupational Therapy Session Note  Patient Details  Name: Melvin Taylor MRN: [id number] Date of Birth: 1965/08/29  Today's Date: 09/09/2022 OT Individual Time: [id number] OT Individual Time Calculation (min): 41 min  OT Individual Time: [id number] OT Individual Time Calculation (min): 48 min   Short Term Goals: Week 1:  OT Short Term Goal 1 (Week 1): Pt will consistently complete toilet transfer min A sing LRAD OT Short Term Goal 1 - Progress (Week 1): Progressing toward goal OT Short Term Goal 2 (Week 1): Pt will utilize RUE during fucntional tasks ~50% of time with mod cueing OT Short Term Goal 2 - Progress (Week 1): Met OT Short Term Goal 3 (Week 1): Pt will maintain dynamic sitting balance CGA using LRAD OT Short Term Goal 3 - Progress (Week 1): Met OT Short Term Goal 4 (Week 1): Pt will sustain attention during BADL/functional task in quiet environment with mod cueing OT Short Term Goal 4 - Progress (Week 1): Progressing toward goal Week 2:  OT Short Term Goal 1 (Week 2): STG=LTG d/t Pt ELOS  Skilled Therapeutic Interventions/Progress Updates:     AM Session: Pt handed off from SLP exiting session upon OT arrival. SLP reporting Pt had BM during her session today and completed self care during her session. Pt reporting 0/10 pain, however groaning when coming to standing and with gentle ROM of RUE- provided rest breaks and repositioning. Pt receptive to skilled OT session.   Pt requiring decreased time to initiate participation this session with improvement noted compared to previous sessions. Pt reeducated on bed mobility using log rolling technique without bed features.Pt completed bed mobility with bed flattened to simulate home environment. Pt able to initiate bending knees and reaching across body to roll onto side following min verbal and tactile cues +time CGA. Pt brought legs off bed min A +time with mod A required to lift trunk Pt initiating push through LUE.   RUE NMR with  gentle AAROM performed with Pt sitting EOB. Increase in spasticity noted during initial movement of elbow extension to flexion limiting ROM. However, Pt arm able to moved through full extension with gentle PROM and massage. Limited at end ROM of elbow flexion. Pt able to bring hand to shoulder and hand to mouth sequence x5 with min A and mod cueing.   Pt educated on modified LB dressing techniques completing seated EOB. Pt required assistance to weave RLE into pants use anterior lean technique. Pt able to weave LLE into pants and pull pants to waist in standing with light min A for balance and mod cueing for technique +time.  Pt educated on shower transfer and receptive to practice in preparation for shower this afternoon. Sit>stand and functional mobility to bathroom min HHA for balance with verbal and tactile cues required for balance and trunk positioning. Completed transfer <> tub bench with min A using grab bars for balance. Pt verbalized safety and comfort completing transfer during shower this afternoon. Ambulated to bed min HHA and returned to bed with mod A to lift legs. Pt was left resting in bed with bed alarm on, call bell in reach, and all needs met.   PM Session: Pt received sleeping in bed easy to wake. Pt agreeable to OT session reporting 0/10 pain. Pt requiring increased time to participate at beginning of session with max VB and tactile cueing provided. Pt able to bend knees, roll onto L side, and lift trunk from flat bed with therapist assist to lower legs during  bed mobility. Pt agreeable to shower with a focus this session on attention, use of RUE, and dynamic sitting balance within BADLs. Pt ambulated to bathroom and transferred to elevated toilet seat with min HHA with maximal motivation provided during task. Increased time required on toilet d/t need for BM (charted in flowsheets). Pt max A for posterior peri-care for time management. Pt doffed pants in standing min A and shirt in  sitting max A with Pt initiating bringing shirt off his head. Pt ambulated to shower and transferred to bench min HHA to light mod for balance with max VB cues for technique and safety. During shower, Pt initiating using RUE requiring min A for UB bathing and max LB bathing d/t sitting balance deficits, small shower space, and decreased motivation to complete task. Following shower, Pt completed dressing EOB. LB dressing completed mod A with anterior weight shifting incorporated into task. UB dressing using hemi-dressing technique mod A and max cueing +time to don loose t-shirt. Increased assistance required to donn sweatshirt. Pt transferred to wc stand pivot min HHA. Pt was left resting in wc with call bell in reach, seat belt alarm on, and all needs met.   Therapy Documentation Precautions:  Precautions Precautions: Fall, Other (comment) Precaution Comments: R hemiplegia with R UE flexor tone and R LE extensor tone (LE tone primarily in the bed), Aphasia, delayed processing Required Braces or Orthoses: Other Brace Other Brace: R UE aircast for tone management Restrictions Weight Bearing Restrictions: No   ADL: ADL Eating: Not assessed Grooming: Maximal assistance Where Assessed-Grooming: Edge of bed Upper Body Bathing: Moderate assistance Where Assessed-Upper Body Bathing: Edge of bed Lower Body Bathing: Maximal assistance Where Assessed-Lower Body Bathing: Edge of bed Upper Body Dressing: Maximal assistance Where Assessed-Upper Body Dressing: Edge of bed Lower Body Dressing: Maximal assistance Where Assessed-Lower Body Dressing: Edge of bed Toileting: Dependent Where Assessed-Toileting: Bed level Toilet Transfer: Moderate assistance Toilet Transfer Method: Sit pivot Toilet Transfer Equipment: Engineer, technical sales Transfer: Not assessed Social research officer, government: Moderate assistance Social research officer, government Method: Radiographer, therapeutic: Grab bars, Transfer tub  bench   Therapy/Group: Individual Therapy  Janey Genta 09/09/2022, 9:32 AM

## 2022-09-09 NOTE — Progress Notes (Signed)
Blood sugar results this AM  relayed to [place], [place] in person.

## 2022-09-09 NOTE — Progress Notes (Signed)
Hypoglycemic Event  CBG: 61  Treatment: 4 oz juice/soda  Symptoms: None  Follow-up CBG: Time:0605 CBG Result:66  Possible Reasons for Event: Unknown  Comments/MD notified:Hypoglycemic Protocol done.    Marcelline Deist

## 2022-09-09 NOTE — Consult Note (Signed)
Neuropsychological Consultation   Patient:   Melvin Taylor   DOB:   Oct 12, 1965  MR Number:  [id number]  Location:  [facility] [id number] [place] [address] Dept: [place]: [phone]           Date of Service:   09/09/2022  Start Time:   10 AM End Time:   11 AM  Provider/Observer:  Ilean Skill, Psy.D.       Clinical Neuropsychologist       Billing Code/Service: [phone]  Reason for Service:    Melvin Taylor is a 57 year old male referred for neuropsychological consultation due to coping and adjustment issues during his ongoing comprehensive inpatient rehabilitation admission.  Patient has a past medical history including previous psychiatric diagnoses of bipolar disorder/schizophrenia.  Patient also with CAD/CABG, diabetes, hypertension, past polysubstance abuse, obstructive sleep apnea but noncompliant with CPAP use and seizure disorder/epilepsy.  Patient has been maintained on Depakote as well as Lamictal.  Patient has had a past left MCA infarction with stenting and was previously on the comprehensive inpatient rehabilitation services between 12/21/2020 - 12/30/2020.  Patient presented on 08/27/2022 from outside hospital ([facility]) after admission on 08/20/2022.  He was there for 1 week.  Patient had altered mental status and identification of restricted blood diffusion in the left temporal medial lobe concerning for acute/subacute CVA.  MRI showed trace subdural hematoma that developed during hospitalization.  Patient was ultimately transferred to [facility] with CT scan showing stable trace subdural hematoma.  There were also remote infarcts in the left basal ganglia, anterior left frontal lobe and corona radiata with no acute CVA noted.  Patient's left MCA stent was stable.  Patient has continued to have pain in his legs but has difficulty  providing adequate detail.  Patient today describes significant pain in his right arm.  After therapy evaluations were conducted he was admitted to the comprehensive inpatient rehabilitation unit.  Patient with significant decreased functional mobility, altered mental status, dysphagia and admitted for comprehensive inpatient program.   Below is the HPI for the current admission for convenience:  HPI: Melvin Taylor is a 57 year old right-handed male with history of bipolar disorder/schizophrenia, CAD/CABG, diabetes mellitus, hypertension, polysubstance abuse, OSA and will not use CPAP and seizure disorder//epilepsy maintained on Depakote as well as Lamictal as well as left MCA infarction with stenting maintained on Brilinta as well as Plavix receiving inpatient rehab services 12/21/2020 - 12/30/2020.  He was discharged to home ambulating 150 feet without assistive device contact-guard.  Per chart review patient lives alone.  1 level apartment with elevator.  Has a home health aide 53 hours a week to assist with making meals dressing bathing managing medications.  Sister does finances and his POA.  Presented 08/27/2022 from outside hospital after admission 1/20-27 with altered mental status.  He was noted to have persistent punctate focus of restricted diffusion in the left temporal medial lobe concerning for acute/subacute CVA on imaging at outside hospital.  MRI showed trace SDH that developed during the hospitalization 4 mm in the left occipital region.  He was transferred to [facility].  Cranial CT scan showed stable trace subdural hematoma along the posterior falx and posteriorly occipital lobe.  Stable remote infarcts of the left basal ganglia, anterior left frontal lobe and corona radiata but no acute CVA noted.  Stable left MCA stent.  Admission chemistries unremarkable except potassium 3.2,  BUN 39, hemoglobin 9.2, valproic acid level 66.  Neurology follow-up patient currently remains on  Brilinta as prior to admission but Plavix on hold.  Neurology felt that  risk of stent restenosis if there is prolonged cessation of Brilinta would most likely outweigh the risk of hemorrhage.  Neurosurgery signed off due to small size and stable appearance of subdural hematoma.  EEG negative for seizure currently remains on Keppra which had been started at outside hospital.  His Depakote and Lamictal prior to admission are currently on hold.  Echocardiogram ejection fraction of 60 to 65% no wall motion abnormalities grade 1 diastolic dysfunction.  Hospital course COVID questionably incidental finding patient asymptomatic and completed antiviral/steroid course and maintained on airborne contact precautions.  He has completed his quarantine today.  Currently on dysphagia #2 nectar thick liquid diet.  Patient reports pain in his legs however he is unable to provide many details regarding this issue.  He does admit to history of diabetes with polyneuropathy.  Therapy evaluations completed due to patient decreased functional mobility altered mental status/dysphagia was admitted for a comprehensive rehab program.   Current Status:  Upon arriving in the patient's room he was alone laying in his bed awake with the head slightly elevated.  Patient immediately began to report pain in his right arm but was unable to provide any specific details.  While patient appeared to adequately understand our conversation and receptive language appeared to be intact the patient clearly had diminished cognitive abilities.  Patient was oriented to self but was not able to specifically identify his location and was disoriented to year, month and time.  With more specific direct questions the patient knew he was in the hospital and was able to remember presenting to [facility] and he remember the ambulance ride to [facility].  With cueing the patient was able to recall previous rehab efforts at [facility].  Patient was unable to  describe or explain what has been told has happened to him most recently but was not surprised when we discussed issues related to stroke/cerebrovascular accident.  Patient did acknowledge past psychiatric history but was a very poor historian.  Patient denied any active psychiatric symptoms and denied significant mood disturbance with either depression or anxiety and denied any psychotic features and no auditory or visual hallucinations.  Patient did not verbalize any type of delusional belief systems.  Behavioral Observation: MERYLE PACIFICO  presents as a 57 y.o.-year-old Right handed African American Male who appeared his stated age. his dress was Appropriate and he was Well Groomed and his manners were Appropriate to the situation.  his participation was indicative of Inattentive and Redirectable behaviors.  There were physical disabilities noted.  he displayed an appropriate level of cooperation and motivation.    Interactions:    Active Inattentive  Attention:   abnormal and attention span appeared shorter than expected for age  Memory:   abnormal; global memory impairment noted  Visuo-spatial:  not examined  Speech (Volume):  low  Speech:   normal; normal  Thought Process:  Circumstantial  Though Content:  WNL; not suicidal, not homicidal, and no significant mood disturbance or psychotic features noted  Orientation:   person and place  Judgment:   Poor  Planning:   Poor  Affect:    Appropriate  Mood:    Dysphoric  Insight:   Shallow  Intelligence:   low   Medical History:   Past Medical History:  Diagnosis Date   Bipolar 1 disorder (  HCC)    depression/anxiety   Bone spur    left heel   Cervical radiculopathy    Chronic back pain    Chronic chest wall pain    Chronic neck pain    Coronary artery disease    a. s/p CABG in 03/2016 with LIMA-LAD, SVG-D1, SVG-RCA, and Seq SVG-mid and distal OM   Diabetes mellitus    Type II   Hallucinations    "long history of  them"   Headache(784.0)    History of gout    HTN (hypertension)    Insomnia    Neuropathy    Pain management    Polysubstance abuse ([employer])    Right leg pain    chronic   Schizophrenia ([place])    Seizures ([place])    last sz between July 5-9th, 2016; epilepsy   Sleep apnea    Stroke [facility])    Transfusion of blood product refused for religious reason          Patient Active Problem List   Diagnosis Date Noted   Protein-calorie malnutrition, severe 08/30/2022   Acute CVA (cerebrovascular accident) ([place]) 08/27/2022   Subdural hematoma ([place]) 08/27/2022   Seizure disorder ([place]) 08/27/2022   OSA (obstructive sleep apnea) [id number]   Acute metabolic encephalopathy [id number]   Anemia due to stage 3b chronic kidney disease ([place]) 06/06/2022   Trigger finger, left middle finger    Left middle cerebral artery stroke ([place]) 12/21/2020   Malnutrition of moderate degree 12/18/2020   Middle cerebral artery embolism, 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   Encounter for intubation    Renal insufficiency    Malignant hypertension    Acute ischemic left MCA stroke ([place]) 12/16/2020   Current smoker 06/21/2019   Seizure-like activity ([place]) 05/06/2019   Diabetes mellitus ([place]) 04/11/2019   Anemia, chronic disease 07/04/2018   Other specified diseases of the digestive system 10/30/2017   CAD (coronary artery disease) 03/28/2016   Chest pain 03/21/2016   Hyperlipidemia 03/11/2016   Ischemic chest pain (HCC)    NSTEMI (non-ST elevated myocardial infarction) ([place])    Coronary atherosclerosis of native coronary artery 03/03/2016   Cervical spinal stenosis 12/14/2015   Loss of weight 08/25/2015   Primary hypertension 07/23/2015   Personal history of noncompliance with medical treatment, presenting hazards to health 07/23/2015   Abdominal pain 06/08/2015   Constipation 06/08/2015   History of colonic polyps    Diverticulosis of colon without hemorrhage    Mucosal abnormality of stomach    Encounter for screening  colonoscopy 04/16/2015   Dysphagia 04/16/2015   Partial symptomatic epilepsy with complex partial seizures, intractable, without status epilepticus ([place]) 11/10/2014   Bipolar disorder, unspecified ([place]) 10/15/2014   Schizophrenia ([name]) 10/15/2014   DM type 2 causing vascular disease ([place]) 06/29/2014   Musculoskeletal pain 06/29/2014   Hyperglycemia without ketosis 09/07/2013   Degeneration disease of medial meniscus 01/13/2011   Other internal derangements of unspecified knee 01/13/2011   Knee pain 12/28/2010   Medial meniscus, posterior horn derangement 12/28/2010    Psychiatric History:  Patient has extensive past psychiatric history including previous diagnosis of bipolar disorder 1 and schizophrenia with documented psychiatric condition going back at least to 2016.  Family Med/Psych History:  Family History  Problem Relation Age of Onset   Diabetes Father    Hypertension Father    Sleep apnea Father    Stroke Sister    Arthritis Other    Diabetes Other    Asthma Other     Risk of  Suicide/Violence: low patient has no history of violence or self-harm and denied any suicidal or homicidal ideation.  Impression/DX:  KHAIDYN JEWART is a 57 year old male referred for neuropsychological consultation due to coping and adjustment issues during his ongoing comprehensive inpatient rehabilitation admission.  Patient has a past medical history including previous psychiatric diagnoses of bipolar disorder/schizophrenia.  Patient also with CAD/CABG, diabetes, hypertension, past polysubstance abuse, obstructive sleep apnea but noncompliant with CPAP use and seizure disorder/epilepsy.  Patient has been maintained on Depakote as well as Lamictal.  Patient has had a past left MCA infarction with stenting and was previously on the comprehensive inpatient rehabilitation services between 12/21/2020 - 12/30/2020.  Patient presented on 08/27/2022 from outside hospital ([facility]) after admission on 08/20/2022.  He was there for 1 week.  Patient had altered mental status and identification of restricted blood diffusion in the left temporal medial lobe concerning for acute/subacute CVA.  MRI showed trace subdural hematoma that developed during hospitalization.  Patient was ultimately transferred to [facility] with CT scan showing stable trace subdural hematoma.  There were also remote infarcts in the left basal ganglia, anterior left frontal lobe and corona radiata with no acute CVA noted.  Patient's left MCA stent was stable.  Patient has continued to have pain in his legs but has difficulty providing adequate detail.  Patient today describes significant pain in his right arm.  After therapy evaluations were conducted he was admitted to the comprehensive inpatient rehabilitation unit.  Patient with significant decreased functional mobility, altered mental status, dysphagia and admitted for comprehensive inpatient program.  Disposition/Plan:  Today we worked on coping and adjustment issues around extended hospital stay and expectations going forward with regard to his discharge.  Diagnosis:    Acute metabolic encephalopathy - Plan: Ambulatory referral to Physical Medicine Rehab, Ambulatory referral to Neurology, CANCELED: Ambulatory referral to Neurology         Electronically Signed   _______________________ Ilean Skill, Psy.D. Clinical Neuropsychologist

## 2022-09-09 NOTE — Progress Notes (Signed)
Speech Language Pathology Daily Session Note  Patient Details  Name: Melvin Taylor MRN: [id number] Date of Birth: Mar 07, 1966  Today's Date: 09/09/2022 SLP Individual Time: 0800-0900 SLP Individual Time Calculation (min): 60 min  Short Term Goals: Week 2: SLP Short Term Goal 1 (Week 2): STG=LTG due to ELOS  Skilled Therapeutic Interventions: Skilled ST treatment focused on swallowing goals. Pt was greeted semi-reclined in bed on arrival. SLP greeted self and pt expressed "oh god" while rolling eyes and pulling covers over his head. Pt eventually agreeable to participate with extensive coaxing. Upon repositioning in bed for completion of swallowing exercises, pt reported he needed to have a bowel movement. Due to urgent nature per pt report, SLP put a bed pan in place. Pt had continence of bowel and required max A for peri care.   SLP facilitated education and completion of shaker (head lift exercise) while in supine position with max A coaching. Pt completed a total of 30 repetitions, while completing intervals of 5 repetitions at a time with 1-1.5 minute rest period between due to fatigue. Pt appeared to benefit from structured cueing (counting down, informing him when next interval is approaching), otherwise pt was known to just lay in bed with minimal response. Pt denied discomfort/pain upon exertion.   HOB elevated to upright position and pt completed oral care with set-up and min A verbal cues for thoroughness. Following oral care, SLP facilitated controlled trials of thin liquid (water) by medicine cup in the amount of 2.5 and 5cc. Pt consumed with what appeared to be delayed oral transit and suspected swallow initiation delay, multiple swallows, and throat clear x1. Recommend continuation of nectar thick liquids and plan for repeat MBS next week to further evaluate.   Patient was left in bed with alarm activated and immediate needs within reach at end of session. Continue per current plan of  care.      Pain  Discomfort upon moving R shoulder, straitening legs in bed. Otherwise no discomfort reported at rest.  Therapy/Group: Individual Therapy  Tru Leopard T Azalynn Maxim 09/09/2022, 8:25 AM

## 2022-09-10 DIAGNOSIS — E11649 Type 2 diabetes mellitus with hypoglycemia without coma: Secondary | ICD-10-CM

## 2022-09-10 LAB — GLUCOSE, CAPILLARY
Glucose-Capillary: 64 mg/dL — ABNORMAL LOW (ref 70–99)
Glucose-Capillary: 76 mg/dL (ref 70–99)
Glucose-Capillary: 85 mg/dL (ref 70–99)
Glucose-Capillary: 117 mg/dL — ABNORMAL HIGH (ref 70–99)
Glucose-Capillary: 129 mg/dL — ABNORMAL HIGH (ref 70–99)

## 2022-09-10 NOTE — Progress Notes (Signed)
Physical Therapy Session Note  Patient Details  Name: Melvin Taylor MRN: [id number] Date of Birth: [birth year]  Today's Date: 09/10/2022 PT Individual Time: 1610-1659 PT Individual Time Calculation (min): 49 min   Short Term Goals: Week 2:  PT Short Term Goal 1 (Week 2): = to LTGs based on ELOS  Skilled Therapeutic Interventions/Progress Updates:    Pt received supine in bed awake, watching TV. Pt agreeable to therapy session without significant amount of encouragement as has been previously needed. Pt does continue to require increased time to initiate OOB mobility; however, improving.   Supine>sitting L EOB, HOB slightly elevated, with mod assist for trunk upright with pt using L UE to assist - lacks adequate anterior trunk flexion with rotation having posterior lean bias.  Sitting EOB requiring varying CGA to total assist for sitting balance due to persistent posterior lean bias - requires frequent max/total assist to regain upright posture due to progressively worsening posterior lean that pt is not able to prevent nor correct - donned UB clothing total assist - threaded on LB clothing total assist - donned TED hose, socks, and shoes total assist   Sit>stand EOB>L HHA with heavy mod assist for lifting to stand due to persistent posterior lean and heavy mod/max assist for standing balance due to strong posterior lean while pulling pants up over hips total assist. Returned to sitting EOB for safety with pt lacking adequate forward trunk flexion/hip hinge when going to sit despite cuing/facilitation.   Pt overall has increased stiffness/rigidity to his trunk.  NT present for BS assessment - educated NT on importance of pt getting OOB during the day and requested to ensure staff assists him to a chair during the day.   Gait training ~13f to dayroom with min assist due to persistent posterior lean with continued lack of adequate L weight shift onto stance limb resulting in decreased R  LE foot clearance although improving  - +2 w/c follow to gym but not needed back to room at end of session.   Sit>stand NMR retraining of having pt reach forward to grab squigz from low step to standing and placing it on mirror to facilitate anterior weight shift still requiring heavy min/light mod assist for balance.  Gait training back to room as described above. Pt left seated in recliner with needs in reach, seat belt alarm on, and B LEs elevated.  Pt overall appears to have improved mood, smiling at therapist throughout session.   Therapy Documentation Precautions:  Precautions Precautions: Fall, Other (comment) Precaution Comments: R hemiplegia with R UE flexor tone and R LE extensor tone (LE tone primarily in the bed), Aphasia, delayed processing Required Braces or Orthoses: Other Brace Other Brace: R UE aircast for tone management Restrictions Weight Bearing Restrictions: No   Pain: Continues to have some discomfort in R hemibody due to hypertonia noted when pt yawns or stretches that side.    Therapy/Group: Individual Therapy  CTawana Scale, PT, DPT, NCS, CSRS 09/10/2022, 3:57 PM

## 2022-09-10 NOTE — Progress Notes (Signed)
Occupational Therapy Session Note  Patient Details  Name: Melvin Taylor MRN: [id number] Date of Birth: 1966-04-10  Today's Date: 09/10/2022 OT Individual Time: 0930-1005 OT Individual Time Calculation (min): 35 min  and Today's Date: 09/10/2022 OT Missed Time: 40 Minutes Missed Time Reason: Patient fatigue;Patient unwilling/refused to participate without medical reason   Short Term Goals: Week 2:  OT Short Term Goal 1 (Week 2): STG=LTG d/t Pt ELOS  Skilled Therapeutic Interventions/Progress Updates:    Pt received supine in bed initially declining OT. Pt noted to be positioned awkwardly in bed and brief soiled. With encouragement, pt agreeable to allow OT to assist with changing brief and repositioning. Pt completed slight bridging 2x and rolling L<>R with min A to change brief, complete hygiene, and change pad. OT provided max assist for repositioning in bed then provided RUE NMR with gentle AAROM and PROM exercises. Pt initially unable to extend elbow to 90 degrees, however with time progressed to extension [facility]. Pt with minimal active engagement in exercises. Pt left supine in bed with HOB elevated and all needs in reach.   Therapy Documentation Precautions:  Precautions Precautions: Fall, Other (comment) Precaution Comments: R hemiplegia with R UE flexor tone and R LE extensor tone (LE tone primarily in the bed), Aphasia, delayed processing Required Braces or Orthoses: Other Brace Other Brace: R UE aircast for tone management Restrictions Weight Bearing Restrictions: No General: General OT Amount of Missed Time: 40 Minutes Vital Signs: Therapy Vitals Temp: 98.4 F (36.9 C) Temp Source: Oral Pulse Rate: (!) 53 Resp: 16 BP: 121/72 Patient Position (if appropriate): Lying Oxygen Therapy SpO2: 100 % O2 Device: Room Air Pain: Pain Assessment Pain Scale: 0-10 Pain Score: 0-No pain ADL: ADL Eating: Not assessed Grooming: Maximal assistance Where Assessed-Grooming: Edge  of bed Upper Body Bathing: Moderate assistance Where Assessed-Upper Body Bathing: Edge of bed Lower Body Bathing: Maximal assistance Where Assessed-Lower Body Bathing: Edge of bed Upper Body Dressing: Maximal assistance Where Assessed-Upper Body Dressing: Edge of bed Lower Body Dressing: Maximal assistance Where Assessed-Lower Body Dressing: Edge of bed Toileting: Dependent Where Assessed-Toileting: Bed level Toilet Transfer: Moderate assistance Toilet Transfer Method: Sit pivot Toilet Transfer Equipment: Engineer, technical sales Transfer: Not assessed Social research officer, government: Moderate assistance Social research officer, government Method: Radiographer, therapeutic: Grab bars, Doctor, general practice   Exercises:   Other Treatments:     Therapy/Group: Individual Therapy  Duayne Cal 09/10/2022, 12:13 PM

## 2022-09-10 NOTE — Significant Event (Signed)
Hypoglycemic Event  CBG: 64  Treatment: 4 oz juice/soda  Symptoms: None  Follow-up CBG: [id number]  CBG Result:74  Possible Reasons for Event: Inadequate meal intake  Comments/MD notified:    Leland Her

## 2022-09-10 NOTE — Progress Notes (Signed)
PROGRESS NOTE   Subjective/Complaints:  In bed this morning.  Blanket over his head.  No new concerns elicited.  Continues to have occasional hypoglycemic episodes.    ROS- cannot obtain due to cognitive deficits  Objective:   No results found. No results for input(s): "WBC", "HGB", "HCT", "PLT" in the last 72 hours.  Recent Labs    09/09/22 0529  NA 136  K 4.4  CL 104  CO2 23  GLUCOSE 60*  BUN 15  CREATININE 1.11  CALCIUM 8.5*     Intake/Output Summary (Last 24 hours) at 09/10/2022 1248 Last data filed at 09/10/2022 1242 Gross per 24 hour  Intake 295 ml  Output --  Net 295 ml         Physical Exam: Vital Signs Blood pressure 121/72, pulse (!) 53, temperature 98.4 F (36.9 C), temperature source Oral, resp. rate 16, height 5' 5"$  (1.651 m), weight 53.2 kg, SpO2 100 %.   General: No acute distress. Lying in bed with blanket covering his face okay Heart: Regular rate and rhythm no rubs murmurs or extra sounds  Lungs: Clear to auscultation, breathing unlabored, no rales or wheezes Abdomen: Positive bowel sounds, soft nontender to palpation, nondistended Extremities: No clubbing, cyanosis, or edema Skin: No evidence of breakdown, no evidence of rash Neurologic: Moving all 4 extremities in bed, antigravity and against resistance. Oriented to self and year with cues. Delayed responses.  Can follow simple commands. CN 2-12 grossly intact Psych: Mildly impulsive, flat affect   Prior exams: Cranial nerves II through XII intact, motor strength is 4/5 in left 3/5 Right , bicep, tricep, grip, hip flexor, knee extensors, ankle dorsiflexor and plantar flexor, left delt RIght deltoid MMT but now able to elevate and abd to 90 deg (improved)    Musculoskeletal:RIght shoulder pain limits abd and flexion , also limited ROM with ER, pain to palp Right wrist no swelling erythema or deformity (? Hyperalgesia  CVA related)  no elow  deformity , mild tenderness R acromian   Assessment/Plan: 1. Functional deficits which require 3+ hours per day of interdisciplinary therapy in a comprehensive inpatient rehab setting. Physiatrist is providing close team supervision and 24 hour management of active medical problems listed below. Physiatrist and rehab team continue to assess barriers to discharge/monitor patient progress toward functional and medical goals  Care Tool:  Bathing    Body parts bathed by patient: Chest, Abdomen, Right upper leg, Left upper leg, Face   Body parts bathed by helper: Front perineal area, Buttocks, Left lower leg, Right lower leg, Left arm, Right arm     Bathing assist Assist Level: Maximal Assistance - Patient 24 - 49%     Upper Body Dressing/Undressing Upper body dressing   What is the patient wearing?: Pull over shirt    Upper body assist Assist Level: Maximal Assistance - Patient 25 - 49%    Lower Body Dressing/Undressing Lower body dressing      What is the patient wearing?: Underwear/pull up, Pants     Lower body assist Assist for lower body dressing: Maximal Assistance - Patient 25 - 49%     Toileting Toileting    Toileting assist Assist  for toileting: Dependent - Patient 0%     Transfers Chair/bed transfer  Transfers assist     Chair/bed transfer assist level: Minimal Assistance - Patient > 75% Chair/bed transfer assistive device:  (HHA)   Locomotion Ambulation   Ambulation assist      Assist level: Minimal Assistance - Patient > 75% Assistive device: Hand held assist Max distance: 16f   Walk 10 feet activity   Assist     Assist level: 2 helpers Assistive device: Hand held assist   Walk 50 feet activity   Assist Walk 50 feet with 2 turns activity did not occur: Safety/medical concerns         Walk 150 feet activity   Assist Walk 150 feet activity did not occur: Safety/medical concerns         Walk 10 feet on uneven  surface  activity   Assist Walk 10 feet on uneven surfaces activity did not occur: Safety/medical concerns         Wheelchair     Assist Is the patient using a wheelchair?: Yes (for transport) Type of Wheelchair: Manual    Wheelchair assist level: Dependent - Patient 0%      Wheelchair 50 feet with 2 turns activity    Assist        Assist Level: Dependent - Patient 0%   Wheelchair 150 feet activity     Assist      Assist Level: Dependent - Patient 0%   Blood pressure 121/72, pulse (!) 53, temperature 98.4 F (36.9 C), temperature source Oral, resp. rate 16, height 5' 5"$  (1.651 m), weight 53.2 kg, SpO2 100 %.  Medical Problem List and Plan: 1. Functional deficits secondary to acute metabolic encephalopathy.  Stable trace subdural hematoma along the posterior falx and posterior occipital lobe.  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  Received inpatient rehab services 12/21/2020 - 12/30/2020 after CVA             -patient may shower             -ELOS/Goals: 09/15/2022 Min A level              -Continue CIR,  PT OT and SLP -walked 20 ft with RW  MOD A with PT    2.  Antithrombotics: -DVT/anticoagulation:  Mechanical:  Antiembolism stockings, knee (TED hose) Bilateral lower extremities             -antiplatelet therapy: Brilinta 90 mg twice daily 3. 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 Mood/Behavior/Sleep: more leathargic since admit, will d/c   Valium( not a home med), D/C Keppra, restart lamictal after  review of home meds with pt's sister (POA) and neuro  Trial modafanil during inpt stay will d/c when he goes home -seen by neuropsychology 09/10/22 5. Neuropsych/cognition: This patient is not capable of making decisions on his own behalf. 6. Skin/Wound Care: Routine skin checks 7. Fluids/Electrolytes/Nutrition: Routine in and outs with follow-up chemistries  -2/4: See BMP results above;  Na 133->134, K 5.7->5.5, and Cr 1.64->1.53 with 1 L IVF yesterday. Continue today, due to hypoglycemia switching to D5 1/2NS 1L overnight. Encouraged PO fluids. AM labs.  - Calorie counts, dietary consult placed to see if  diet can be further optimized to reduce hypoglycemic events 2/5 will order lokelma for K+ 5.8, give 10g lokelma  2/6 K+ continues to be elevated, increase IVF to134m/hr, change ensure to nepro, discussed with pharmacy-no meds likely contributing, lokelma TID for 3 doses planned -Addendum K+ down to 4.6, will hold off on further lokelma, recheck tomorrow  Normal BMET 2/9   8.  Seizure disorder/epilepsy.  EEG negative.  Keppra 1000 mg every 12 hours, Depakote 500 mg twice a day  9.  Diabetes mellitus.  Currently SSI.  per sister not on Lantus at home  CBG (last 3)  Recent Labs    09/10/22 0529 09/10/22 0601 09/10/22 1138  GLUCAP 64* 76 85   No on hypoglycemic agents, enc po intake  2/10 continue to encourage PO intake for occasional hypoglycemic episodes , DC SSI-not requiring 10.  Hyperlipidemia.  Lipitor 857mdaily 11.  CAD with CABG 2017.  Continue Imdur 30 mg daily.  No chest pain or shortness of breath. 12.  History of hypertension.  Patient on Norvasc 10 mg daily, Lopressor 12.5 mg twice daily, lisinopril 5 mg daily, -2/10 Well controlled, continue current regimen Vitals:   09/09/22 2020 09/10/22 0847  BP: 125/79 121/72  Pulse: 65 (!) 53  Resp: 18 16  Temp: 98 F (36.7 C) 98.4 F (36.9 C)  SpO2: 100% 100%      13.  COVID-19.  Resolved.  Precautions have been  discontinued 14. Hyponatremic, Na 129  -2/5, was given D5 1/2 NS last night, will start D5NS 5042mr  -2/6 Na improved to 135 today 15. AKI  -2/5 Start IVF  2/6 improving, increase IVF to 100m46m      Latest Ref Rng & Units 09/09/2022    5:29 AM 09/07/2022    7:18 AM 09/06/2022    7:54 PM  BMP  Glucose 70 - 99 mg/dL 60  115  79   BUN 6 - 20 mg/dL 15  17  19   $ Creatinine 0.61 - 1.24 mg/dL 1.11  1.12  1.22   Sodium 135 - 145 mmol/L 136  135  135   Potassium 3.5 - 5.1 mmol/L 4.4  4.7  4.6   Chloride 98 - 111 mmol/L 104  107  108   CO2 22 - 32 mmol/L 23  20  23   $ Calcium 8.9 - 10.3 mg/dL 8.5  8.1  8.3    2/9 BMET normal off IVF, monitor intake  2/10 encourage oral intake  LOS: 10 days A FACE TO FACE EVALUATION WAS PERFORMED  YuriJennye Boroughs0/2024, 12:48 PM

## 2022-09-11 DIAGNOSIS — D649 Anemia, unspecified: Secondary | ICD-10-CM

## 2022-09-11 LAB — GLUCOSE, CAPILLARY
Glucose-Capillary: 114 mg/dL — ABNORMAL HIGH (ref 70–99)
Glucose-Capillary: 91 mg/dL (ref 70–99)
Glucose-Capillary: 118 mg/dL — ABNORMAL HIGH (ref 70–99)
Glucose-Capillary: 110 mg/dL — ABNORMAL HIGH (ref 70–99)

## 2022-09-11 MED ORDER — DICLOFENAC SODIUM 1 % EX GEL
4.0000 g | Freq: Four times a day (QID) | CUTANEOUS | Status: DC
  Administered 2022-09-11 – 2022-09-15 (×13): 4 g via TOPICAL
  Filled 2022-09-11: qty 100

## 2022-09-11 NOTE — Progress Notes (Signed)
Occupational Therapy Session Note  Patient Details  Name: Melvin Taylor MRN: [id number] Date of Birth: Aug 20, 1965  Today's Date: 09/11/2022 OT Individual Time: 1035-1105 OT Individual Time Calculation (min): 30 min    Short Term Goals:  Week 2:  OT Short Term Goal 1 (Week 2): STG=LTG d/t Pt ELOS  Skilled Therapeutic Interventions/Progress Updates:    Pt taking medication with nurse upon OT arrival therefore 10 minutes of treatment missed at beginning of session.  Patient appearing drowsy and eyes closing intermittently needing cues to increase alertness.  Pt completed supine to sit with increased time for processing skills and needing assist to approach EOB. Pt needing min assist to recover while sitting EOB due to posterior LOB.  Pt donned pants with mod assist at sit<>stand level.  Pt also significantly leaning posteriorly while in standing and LLE slipping forward needing mod assist to maintain balance and block left foot.  Returned to sitting then with cues, pt agreeable to sit up in recliner.  Needed mod assist for stand pivot EOB to recliner and cues for sequencing.  Call bell in reach, seat alarm on.  Therapy Documentation Precautions:  Precautions Precautions: Fall, Other (comment) Precaution Comments: R hemiplegia with R UE flexor tone and R LE extensor tone (LE tone primarily in the bed), Aphasia, delayed processing Required Braces or Orthoses: Other Brace Other Brace: R UE aircast for tone management Restrictions Weight Bearing Restrictions: No   Therapy/Group: Individual Therapy  Ezekiel Slocumb 09/11/2022, 7:59 AM

## 2022-09-11 NOTE — Progress Notes (Signed)
PROGRESS NOTE   Subjective/Complaints:  Reports left knee soreness, no additional concerns this AM.    ROS- cannot obtain due to cognitive deficits  Objective:   No results found. No results for input(s): "WBC", "HGB", "HCT", "PLT" in the last 72 hours.  Recent Labs    09/09/22 0529  NA 136  K 4.4  CL 104  CO2 23  GLUCOSE 60*  BUN 15  CREATININE 1.11  CALCIUM 8.5*     Intake/Output Summary (Last 24 hours) at 09/11/2022 1551 Last data filed at 09/11/2022 1234 Gross per 24 hour  Intake 354 ml  Output 375 ml  Net -21 ml         Physical Exam: Vital Signs Blood pressure 119/78, pulse 60, temperature 98.4 F (36.9 C), resp. rate 15, height 5' 5"$  (1.651 m), weight 53.2 kg, SpO2 100 %.   General: No acute distress. Lying in bed with blanket covering his face okay Heart: Regular rate and rhythm no rubs murmurs or extra sounds  Lungs: Clear to auscultation, breathing unlabored, no rales or wheezes Abdomen: Positive bowel sounds, soft nontender to palpation, nondistended Extremities: No clubbing, cyanosis, or edema Skin: No evidence of breakdown, no evidence of rash Neurologic: Moving all 4 extremities in bed, antigravity and against resistance. Oriented to self and year with cues. Delayed responses.  Can follow simple commands. CN 2-12 grossly intact Psych: Mildly impulsive, flat affect   Prior exams: Cranial nerves II through XII intact, motor strength is 4/5 in left 3/5 Right , bicep, tricep, grip, hip flexor, knee extensors, ankle dorsiflexor and plantar flexor, left delt RIght deltoid MMT but now able to elevate and abd to 90 deg (improved)    Musculoskeletal:RIght shoulder pain limits abd and flexion , also limited ROM with ER, pain to palp Right wrist no swelling erythema or deformity (? Hyperalgesia  CVA related) no elow  deformity , mild tenderness R acromian L knee tenderness at joint line medial and  lateral, no pain with varus or valgus stress, no swelling    Assessment/Plan: 1. Functional deficits which require 3+ hours per day of interdisciplinary therapy in a comprehensive inpatient rehab setting. Physiatrist is providing close team supervision and 24 hour management of active medical problems listed below. Physiatrist and rehab team continue to assess barriers to discharge/monitor patient progress toward functional and medical goals  Care Tool:  Bathing    Body parts bathed by patient: Chest, Abdomen, Right upper leg, Left upper leg, Face   Body parts bathed by helper: Front perineal area, Buttocks, Left lower leg, Right lower leg, Left arm, Right arm     Bathing assist Assist Level: Maximal Assistance - Patient 24 - 49%     Upper Body Dressing/Undressing Upper body dressing   What is the patient wearing?: Pull over shirt    Upper body assist Assist Level: Maximal Assistance - Patient 25 - 49%    Lower Body Dressing/Undressing Lower body dressing      What is the patient wearing?: Underwear/pull up, Pants     Lower body assist Assist for lower body dressing: Maximal Assistance - Patient 25 - 49%     Toileting Toileting  Toileting assist Assist for toileting: Dependent - Patient 0%     Transfers Chair/bed transfer  Transfers assist     Chair/bed transfer assist level: Minimal Assistance - Patient > 75% Chair/bed transfer assistive device:  (HHA)   Locomotion Ambulation   Ambulation assist      Assist level: Minimal Assistance - Patient > 75% Assistive device: Hand held assist Max distance: 11f   Walk 10 feet activity   Assist     Assist level: 2 helpers Assistive device: Hand held assist   Walk 50 feet activity   Assist Walk 50 feet with 2 turns activity did not occur: Safety/medical concerns         Walk 150 feet activity   Assist Walk 150 feet activity did not occur: Safety/medical concerns         Walk 10 feet on  uneven surface  activity   Assist Walk 10 feet on uneven surfaces activity did not occur: Safety/medical concerns         Wheelchair     Assist Is the patient using a wheelchair?: Yes (for transport) Type of Wheelchair: Manual    Wheelchair assist level: Dependent - Patient 0%      Wheelchair 50 feet with 2 turns activity    Assist        Assist Level: Dependent - Patient 0%   Wheelchair 150 feet activity     Assist      Assist Level: Dependent - Patient 0%   Blood pressure 119/78, pulse 60, temperature 98.4 F (36.9 C), resp. rate 15, height 5' 5"$  (1.651 m), weight 53.2 kg, SpO2 100 %.  Medical Problem List and Plan: 1. Functional deficits secondary to acute metabolic encephalopathy.  Stable trace subdural hematoma along the posterior falx and posterior occipital lobe.  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  Received inpatient rehab services 12/21/2020 - 12/30/2020 after CVA             -patient may shower             -ELOS/Goals: 09/15/2022 Min A level              -Continue CIR,  PT OT and SLP -walked 20 ft with RW  MOD A with PT    2.  Antithrombotics: -DVT/anticoagulation:  Mechanical:  Antiembolism stockings, knee (TED hose) Bilateral lower extremities             -antiplatelet therapy: Brilinta 90 mg twice daily 3. 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 Mood/Behavior/Sleep: more leathargic since admit, will d/c   Valium( not a home med), D/C  Keppra, restart lamictal after review of home meds with pt's sister (POA) and neuro  Trial modafanil during inpt stay will d/c when he goes home -seen by neuropsychology 09/10/22 5. Neuropsych/cognition: This patient is not capable of making decisions on his own behalf. 6. Skin/Wound Care: Routine skin checks 7. Fluids/Electrolytes/Nutrition: Routine in and outs with  follow-up chemistries  -2/4: See BMP results above; Na 133->134, K 5.7->5.5, and Cr 1.64->1.53 with 1 L IVF yesterday. Continue today, due to hypoglycemia switching to D5 1/2NS 1L overnight. Encouraged PO fluids. AM labs.  - Calorie counts, dietary consult placed to see if  diet can be further optimized to reduce hypoglycemic events 2/5 will order lokelma for K+ 5.8, give 10g lokelma  2/6 K+ continues to be elevated, increase IVF to144m/hr, change ensure to nepro, discussed with pharmacy-no meds likely contributing, lokelma TID for 3 doses planned -Addendum K+ down to 4.6, will hold off on further lokelma, recheck tomorrow  Normal BMET 2/9   8.  Seizure disorder/epilepsy.  EEG negative.  Keppra 1000 mg every 12 hours, Depakote 500 mg twice a day  9.  Diabetes mellitus.  Currently SSI.  per sister not on Lantus at home  CBG (last 3)  Recent Labs    09/10/22 2044 09/11/22 0607 09/11/22 1129  GLUCAP 129* 114* 91   No on hypoglycemic agents, enc po intake  2/10 continue to encourage PO intake for occasional hypoglycemic episodes , DC SSI-not requiring 2/11 no further hypoglycemic events noted 10.  Hyperlipidemia.  Lipitor 872mdaily 11.  CAD with CABG 2017.  Continue Imdur 30 mg daily.  No chest pain or shortness of breath. 12.  History of hypertension.  Patient on Norvasc 10 mg daily, Lopressor 12.5 mg twice daily, lisinopril 5 mg daily, -2/11 well controlled Vitals:   09/11/22 0507 09/11/22 1247  BP: (!) 142/88 119/78  Pulse: (!) 51 60  Resp: 17 15  Temp: 98 F (36.7 C) 98.4 F (36.9 C)  SpO2: 100% 100%      13.   COVID-19.  Resolved.  Precautions have been discontinued 14. Hyponatremic, Na 129  -2/5, was given D5 1/2 NS last night, will start D5NS 5063mr  -2/6 Na improved to 135 today  Recheck tomorrow 15. AKI  -2/5 Start IVF  2/6 improving, increase IVF to 100m27m      Latest Ref Rng & Units 09/09/2022    5:29 AM 09/07/2022    7:18 AM 09/06/2022    7:54 PM  BMP  Glucose 70 - 99 mg/dL 60  115  79   BUN 6 - 20 mg/dL 15  17  19   $ Creatinine 0.61 - 1.24 mg/dL 1.11  1.12  1.22   Sodium 135 - 145 mmol/L 136  135  135   Potassium 3.5 - 5.1 mmol/L 4.4  4.7  4.6   Chloride 98 - 111 mmol/L 104  107  108   CO2 22 - 32 mmol/L 23  20  23   $ Calcium 8.9 - 10.3 mg/dL 8.5  8.1  8.3    2/9 BMET normal off IVF, monitor intake  2/11 Recheck BMET tomorrow  16. Anemia acute on chronic  -2/11Recheck CBC tomorrow ordered  LOS: 11 days A FACE TO FACE EVALUATION WAS PERFORMED  YuriJennye Boroughs1/2024, 3:51 PM

## 2022-09-12 ENCOUNTER — Inpatient Hospital Stay (HOSPITAL_COMMUNITY): Payer: Medicaid Other

## 2022-09-12 LAB — CBC
HCT: 25.9 % — ABNORMAL LOW (ref 39.0–52.0)
Hemoglobin: 8.5 g/dL — ABNORMAL LOW (ref 13.0–17.0)
MCH: 29.3 pg (ref 26.0–34.0)
MCHC: 32.8 g/dL (ref 30.0–36.0)
MCV: 89.3 fL (ref 80.0–100.0)
Platelets: 200 10*3/uL (ref 150–400)
RBC: 2.9 MIL/uL — ABNORMAL LOW (ref 4.22–5.81)
RDW: 15.6 % — ABNORMAL HIGH (ref 11.5–15.5)
WBC: 3.4 10*3/uL — ABNORMAL LOW (ref 4.0–10.5)
nRBC: 0 % (ref 0.0–0.2)

## 2022-09-12 LAB — BASIC METABOLIC PANEL
Anion gap: 4 — ABNORMAL LOW (ref 5–15)
BUN: 30 mg/dL — ABNORMAL HIGH (ref 6–20)
CO2: 27 mmol/L (ref 22–32)
Calcium: 8.6 mg/dL — ABNORMAL LOW (ref 8.9–10.3)
Chloride: 102 mmol/L (ref 98–111)
Creatinine, Ser: 1.63 mg/dL — ABNORMAL HIGH (ref 0.61–1.24)
GFR, Estimated: 49 mL/min — ABNORMAL LOW (ref >60)
Glucose, Bld: 89 mg/dL (ref 70–99)
Potassium: 5.1 mmol/L (ref 3.5–5.1)
Sodium: 133 mmol/L — ABNORMAL LOW (ref 135–145)

## 2022-09-12 LAB — GLUCOSE, CAPILLARY
Glucose-Capillary: 84 mg/dL (ref 70–99)
Glucose-Capillary: 85 mg/dL (ref 70–99)
Glucose-Capillary: 46 mg/dL — ABNORMAL LOW (ref 70–99)
Glucose-Capillary: 52 mg/dL — ABNORMAL LOW (ref 70–99)
Glucose-Capillary: 81 mg/dL (ref 70–99)
Glucose-Capillary: 167 mg/dL — ABNORMAL HIGH (ref 70–99)

## 2022-09-12 NOTE — Progress Notes (Signed)
Hypoglycemic Event  CBG: 52  Treatment: 8 oz juice/soda and 1 tube glucose gel  Symptoms: None  Follow-up CBG: Time:46 CBG Result:81  Possible Reasons for Event: Inadequate meal intake  Comments/MD notified: Reesa Chew PA notified;pt asymptomatic follow protocol    Jolynne Spurgin [email or web address]

## 2022-09-12 NOTE — Progress Notes (Signed)
PROGRESS NOTE   Subjective/Complaints:  Cannot express well (aphasic from prior CVA)  ROS- cannot obtain due to cognitive deficits  Objective:   No results found. No results for input(s): "WBC", "HGB", "HCT", "PLT" in the last 72 hours.  No results for input(s): "NA", "K", "CL", "CO2", "GLUCOSE", "BUN", "CREATININE", "CALCIUM" in the last 72 hours.   Intake/Output Summary (Last 24 hours) at 09/12/2022 0836 Last data filed at 09/11/2022 1806 Gross per 24 hour  Intake 177 ml  Output 150 ml  Net 27 ml         Physical Exam: Vital Signs Blood pressure 117/70, pulse (!) 55, temperature 98.2 F (36.8 C), resp. rate 17, height 5' 5"$  (1.651 m), weight 53.2 kg, SpO2 100 %.   General: No acute distress. EOB dressing with OT  Heart: Regular rate and rhythm no rubs murmurs or extra sounds  Lungs: Clear to auscultation, breathing unlabored, no rales or wheezes Abdomen: Positive bowel sounds, soft nontender to palpation, nondistended Extremities: No clubbing, cyanosis, or edema Skin: No evidence of breakdown, no evidence of rash Neurologic: Moving all 4 extremities in bed, antigravity and against resistance. Oriented to self and year with cues. Delayed responses.  Can follow simple commands. CN 2-12 grossly intact Psych: Mildly impulsive, flat affect   Prior exams: Cranial nerves II through XII intact, motor strength is 4/5 in left 3/5 Right , bicep, tricep, grip, hip flexor, knee extensors, ankle dorsiflexor and plantar flexor, left delt RIght deltoid MMT but now able to elevate and abd to 90 deg (improved)    Musculoskeletal:RIght shoulder pain limits abd and flexion , also limited ROM with ER, pain to palp Right wrist no swelling erythema or deformity (? Hyperalgesia  CVA related) no elow  deformity , mild tenderness R acromian No L knee tenderness at joint line medial and lateral, no pain with varus or valgus stress, no  swelling  Left ankle pain with dorsiflexion, no deformity , no joint swelling or erythema   Assessment/Plan: 1. Functional deficits which require 3+ hours per day of interdisciplinary therapy in a comprehensive inpatient rehab setting. Physiatrist is providing close team supervision and 24 hour management of active medical problems listed below. Physiatrist and rehab team continue to assess barriers to discharge/monitor patient progress toward functional and medical goals  Care Tool:  Bathing    Body parts bathed by patient: Chest, Abdomen, Right upper leg, Left upper leg, Face   Body parts bathed by helper: Front perineal area, Buttocks, Left lower leg, Right lower leg, Left arm, Right arm     Bathing assist Assist Level: Maximal Assistance - Patient 24 - 49%     Upper Body Dressing/Undressing Upper body dressing   What is the patient wearing?: Pull over shirt    Upper body assist Assist Level: Maximal Assistance - Patient 25 - 49%    Lower Body Dressing/Undressing Lower body dressing      What is the patient wearing?: Underwear/pull up, Pants     Lower body assist Assist for lower body dressing: Maximal Assistance - Patient 25 - 49%     Toileting Toileting    Toileting assist Assist for toileting: Dependent - Patient  0%     Transfers Chair/bed transfer  Transfers assist     Chair/bed transfer assist level: Minimal Assistance - Patient > 75% Chair/bed transfer assistive device:  (HHA)   Locomotion Ambulation   Ambulation assist      Assist level: Minimal Assistance - Patient > 75% Assistive device: Hand held assist Max distance: 47f   Walk 10 feet activity   Assist     Assist level: 2 helpers Assistive device: Hand held assist   Walk 50 feet activity   Assist Walk 50 feet with 2 turns activity did not occur: Safety/medical concerns         Walk 150 feet activity   Assist Walk 150 feet activity did not occur: Safety/medical  concerns         Walk 10 feet on uneven surface  activity   Assist Walk 10 feet on uneven surfaces activity did not occur: Safety/medical concerns         Wheelchair     Assist Is the patient using a wheelchair?: Yes (for transport) Type of Wheelchair: Manual    Wheelchair assist level: Dependent - Patient 0%      Wheelchair 50 feet with 2 turns activity    Assist        Assist Level: Dependent - Patient 0%   Wheelchair 150 feet activity     Assist      Assist Level: Dependent - Patient 0%   Blood pressure 117/70, pulse (!) 55, temperature 98.2 F (36.8 C), resp. rate 17, height 5' 5"$  (1.651 m), weight 53.2 kg, SpO2 100 %.  Medical Problem List and Plan: 1. Functional deficits secondary to acute metabolic encephalopathy.  Stable trace subdural hematoma along the posterior falx and posterior occipital lobe.  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  Received inpatient rehab services 12/21/2020 - 12/30/2020 after CVA             -patient may shower             -ELOS/Goals: 09/15/2022 Min A level              -Continue CIR,  PT OT and SLP -walked 20 ft with RW  MOD A with PT    2.  Antithrombotics: -DVT/anticoagulation:  Mechanical:  Antiembolism stockings, knee (TED hose) Bilateral lower extremities             -antiplatelet therapy: Brilinta 90 mg twice daily 3. 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 Mood/Behavior/Sleep: more leathargic since admit, will d/c   Valium( not a home med), D/C Keppra, restart lamictal after review of home meds with pt's sister (POA) and neuro  Trial modafanil during inpt stay will d/c when he goes home -seen by neuropsychology 09/10/22- limited eval due to cognition 5. Neuropsych/cognition: This patient is not capable of making  decisions on his own behalf. 6. Skin/Wound Care: Routine skin checks 7. Fluids/Electrolytes/Nutrition: Routine in and outs with follow-up chemistries  -2/4: See BMP results above; Na 133->134, K 5.7->5.5, and Cr 1.64->1.53 with 1 L IVF yesterday. Continue today, due to hypoglycemia switching to D5 1/2NS 1L overnight. Encouraged PO fluids. AM labs.  - Calorie counts, dietary consult placed to see if  diet can be further optimized to reduce hypoglycemic events 2/5 will order lokelma for K+ 5.8, give 10g lokelma  2/6 K+ continues to be elevated, increase IVF to145m/hr, change ensure to nepro, discussed with pharmacy-no meds likely contributing, lokelma TID for 3 doses planned -Addendum K+ down to 4.6, will hold off on further lokelma, recheck tomorrow    Latest Ref Rng & Units 09/09/2022    5:29 AM 09/07/2022    7:18 AM 09/06/2022    7:54 PM  BMP  Glucose 70 - 99 mg/dL 60  115  79   BUN 6 - 20 mg/dL 15  17  19   $ Creatinine 0.61 - 1.24 mg/dL 1.11  1.12  1.22   Sodium 135 - 145 mmol/L 136  135  135   Potassium 3.5 - 5.1 mmol/L 4.4  4.7  4.6   Chloride 98 - 111 mmol/L 104  107  108   CO2 22 - 32 mmol/L 23  20  23   $ Calcium 8.9 - 10.3 mg/dL 8.5  8.1  8.3      8.  Seizure disorder/epilepsy.  EEG negative.  Keppra 1000 mg every 12 hours, Depakote 500 mg twice a day  9.  Diabetes mellitus.  Currently SSI.  per sister not on Lantus at home  CBG (last 3)  Recent Labs    09/11/22 1648 09/11/22 2036 09/12/22 0556  GLUCAP 118* 110* 84   No on hypoglycemic agents,  enc po intake  2/10 continue to encourage PO intake for occasional hypoglycemic episodes , DC SSI-not requiring 2/11 no further hypoglycemic events noted 10.  Hyperlipidemia.  Lipitor 866mdaily 11.  CAD with CABG 2017.  Continue Imdur 30 mg daily.  No chest pain or shortness of breath. 12.  History of hypertension.  Patient on Norvasc 10 mg daily, Lopressor 12.5 mg twice daily, lisinopril 5 mg daily, -2/12 well controlled Vitals:   09/11/22 1932 09/12/22 0358  BP: 101/65 117/70  Pulse: 63 (!) 55  Resp: 16 17  Temp: 99.3 F (37.4 C) 98.2 F (36.8 C)  SpO2: 97% 100%      13.  COVID-19.  Resolved.  Precautions have been discontinued 14. Hyponatremic, Na 129  -2/5, was given D5 1/2 NS last night, will start D5NS 5038mr  -2/6 Na improved to 135 today  Recheck tomorrow 15. AKI  -2/5 Start IVF  2/6 improving, increase IVF to 100m66m      Latest Ref Rng & Units 09/09/2022    5:29 AM 09/07/2022    7:18 AM 09/06/2022    7:54 PM  BMP  Glucose 70 - 99 mg/dL 60  115  79   BUN 6 - 20 mg/dL 15  17  19   $ Creatinine 0.61 - 1.24 mg/dL 1.11  1.12  1.22   Sodium 135 - 145 mmol/L 136  135  135   Potassium 3.5 - 5.1 mmol/L 4.4  4.7  4.6   Chloride 98 - 111 mmol/L 104  107  108   CO2 22 - 32 mmol/L 23  20  23   $ Calcium 8.9 - 10.3 mg/dL 8.5  8.1  8.3    2/9 BMET normal off IVF, monitor intake  2/11 Recheck BMET tomorrow  16. Anemia acute on chronic  -2/11Recheck CBC tomorrow ordered  LOS: 12 days A FACE TO FACE EVALUATION WAS PERFORMED  Charlett Blake 09/12/2022, 8:36 AM

## 2022-09-12 NOTE — Progress Notes (Signed)
Occupational Therapy Session Note  Patient Details  Name: Melvin Taylor MRN: [id number] Date of Birth: 10/19/1965  Today's Date: 09/12/2022 OT Individual Time: 1120-1205 OT Individual Time Calculation (min): 45 min    Short Term Goals: Week 1:  OT Short Term Goal 1 (Week 2): STG=LTG d/t Pt ELOS  Skilled Therapeutic Interventions/Progress Updates:    Pt in bed partially awake upon therapy arrival. Patient agreeable to participate in OT session. Pain: Facial grimacing when right arm or right leg was moved. No number provided. RUE air cast was removed during session and replaced at the end.   Patient participated in skilled OT session focusing on bed mobility, ADL re-training, cognitive function, standing balance, and LUE strength. Therapist integrated use of RUE during opening opening of containers during grooming task in order to promote bilateral hand coordination and increase functional use of the RUE.   With increased time, pt transitioned to seated on EOB with Mod A; HOB elevated. Pt completed sqt pvt transfer with Min A to w/c from bed.  Grooming task (brushing teeth) completed while seated at sink at w/c level. OT provided VC to initiate task, requested using RUE to hold and stabilize toothpaste while unscrewing cap with Left hand. OT provided active assisted movement of RUE in order to focus on bilateral use during set up. Pt was able to replace cap of toothpaste without assist, with increased time, and moderate difficulty.   Pt participated in functional reaching task while standing at bedroom window. With Min guard provided for standing balance, pt used his left UE to remove 4 Squigz from window focusing on shoulder stability, hand and arm strength, and standing balance.   VC provided during session for hand placement during sit to stand transitions as well as focus on speed and control of decent.     Therapy Documentation Precautions:  Precautions Precautions: Fall, Other  (comment) Precaution Comments: R hemiplegia with R UE flexor tone and R LE extensor tone (LE tone primarily in the bed), Aphasia, delayed processing Required Braces or Orthoses: Other Brace Other Brace: R UE aircast for tone management Restrictions Weight Bearing Restrictions: No  Therapy/Group: Individual Therapy  Ailene Ravel, OTR/L,CBIS  Supplemental OT - [facility] and [facility] Secure Chat Preferred   09/12/2022, 7:56 AM

## 2022-09-12 NOTE — Progress Notes (Signed)
Occupational Therapy Session Note  Patient Details  Name: Melvin Taylor MRN: [id number] Date of Birth: 1966-04-18  Today's Date: 09/12/2022 OT Individual Time: [id number] OT Individual Time Calculation (min): 45 min  OT Individual Time: [id number] OT Individual Time Calculation (min): 72 min   Short Term Goals: Week 2:  OT Short Term Goal 1 (Week 2): STG=LTG d/t Pt ELOS  Skilled Therapeutic Interventions/Progress Updates:     AM Session:  Pt received sleeping in bed easy to wake, however requiring increased time and motivation to initiate participating in session this AM. Pt presenting to be in increased pain this AM with slight movements of BLE- pt presenting with extreme reaction and increased sensitivity to pain. Contacted RN, Rn in/out at beginning of session to provide pain medications. Focus this session BADL retraining and functional mobility/transfers.   Pt able to bed knees in preparation for log roll with min A for RLE +time and maximal motivation. Pt rolled to L side with min A and required max A to lift trunk to come to sitting although OT facilitating Pt pushing through LUE. MD in/out for morning rounds.   Pt able to maintain dynamic sitting balance with SBA to CGA this session. Provided Pt shirt with education on modified (hemi-dressing) technique to increase independencies. Pt initially refusing to use modified strategy, however Pt noticing he is unable to put on shirt without increased assistance using traditional method. Pt then receptive to using modified strategy donning RUE first with min A to bring shirt completely to shoulder. Pt able to weave LUE into shirt and bring shirt overhead with maximal cueing ++time. Pt max A to weaving RLE into pants and min A to weave LLE and bring pants to waist in standing.   Pt reporting need to use restroom. Pt sit>stand ambulating to bathroom mod HHA transitioning to min A with weight shifting facilitation during task. Utilized grab bars  to transition to elevated toilet seat. Pt required increased time on toilet for BM. Contacted nursing staff and handed Pt off to nursing staff at end of session. Pt was left resting on toilet with all immediate needs met and nursing staff present in room.  PM Session:  Pt received sleeping in bed easy to wake with increased time and motivation required to support Pt engagement in session. Pt reporting pain at beginning of session- Rn in/out to provide pain medications with OT offering rest breaks and repositioning to accommodate for pain.   Pt able to bring legs into bent positioning and roll onto side with min A this session. Max motivation and mod A required to facilitate Pt utilizing LUE to lift trunk. Pt sit>stand ambulating to wc with min HHA mod verbal cues required for body positioning and alignment.   Pt transported total A to therapy gym for time management and energy conservation. Pt completed functional mobility training 2x15 ft mod HHA transitioning to min HHA with practice.   Completed gentle PROM transitioning to [facility] of Pt RUE. Pt initially presenting with spasticity in RUE preventing full ROM, however able to move through full ROM at elbow and wrist with gentle prolonged stretch and massage.   Pt completed medium sized peg board activity to increase functional use of RUE and facilitate functional reaching with grasp and release component. Pt able to place and remove 3 pegs from board x2 trials with mod A provided under elbow ++time and maximal motivation provided. Pt demonstrating mildly increased finger flexion extension and grasp for BADL and functional tasks.  Pt transported back to room total A in wc. Pt was left resting in wc with call bell in reach, chair alarm on, and all needs met.    Therapy Documentation Precautions:  Precautions Precautions: Fall, Other (comment) Precaution Comments: R hemiplegia with R UE flexor tone and R LE extensor tone (LE tone primarily in the  bed), Aphasia, delayed processing Required Braces or Orthoses: Other Brace Other Brace: R UE aircast for tone management Restrictions Weight Bearing Restrictions: No   Therapy/Group: Individual Therapy  Janey Genta 09/12/2022, 8:02 AM

## 2022-09-12 NOTE — Discharge Summary (Signed)
Physician Discharge Summary  Patient ID: Melvin Taylor MRN: [id number] DOB/AGE: 1966/01/03 57 y.o.  Admit date: 08/31/2022 Discharge date: 09/15/2022  Discharge Diagnoses:  Principal Problem:   Acute metabolic encephalopathy Active Problems:   Primary hypertension   Hyperlipidemia Mood stabilization Hyperlipidemia CAD with CABG COVID-19 Diabetes mellitus AKI/CKD Anemia acute on chronic OSA  Discharged Condition: Stable  Significant Diagnostic Studies: DG Swallowing Func-Speech Pathology  Result Date: 09/14/2022 Table formatting from the original result was not included. Modified Barium Swallow Study Patient Details Name: Melvin Taylor MRN: [id number] Date of Birth: 1966-02-27 Today's Date: 09/14/2022 HPI/PMH: HPI: 57 y.o. male with medical history significant of bipolar d/o vs/ schizophrenia, chronic chest wall/neck/back pain, CAD s/p CABG, DM, HTN, polysubstance abuse, OSA, and seizure d/o presenting with AMS.  He was admitted to [facility] from 1/20-27 with AMS, minimally improving during hospitalization.  He was noted to have a persistent punctate focus of restricted diffusion in the L medial temporal lobe, concerning for acute/subacute CVA.  MRI/CT with SDH that developed during the hospitalization, 4 mm in L occipital region.  He has been on IV Keppra throughout the hospitalization without apparent seizure activity.  He is able to answer very limited questions, none about orientation.  He does follow some commands Clinical Impression: Clinical Impression: MBSS completed this date to assess readiness for upgrading diet textures in preparation of d/c home next date. Pt with similar presentation to MBSS completed on 08/30/2022. 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 Oral phase limited by oral holding and trace oral residuals post-swallow likely due to decreased lingual strength. Pharyngeal phase limited by  anatomical variations and decreased ROM of laryngeal structures (appear relatively anchored). Decreased anterior hyolaryngeal elevation and excursion noted with partial epiglottic inversion, decreased UES relaxation with retrograde flow above UES, and diffuse pharyngeal residuals (mild to mild-moderate in volume). Of note, cervical spine hardware noted from previous procedure.  Aforementioned findings resulted in flash penetration (PAS 2) with nectar-thick liqiuds and honey-thick liquids via tsp and cup. Deeper penetration with resultant silent aspiration (PAS 8) noted with thin liquid via cup and straw, particularly with larger boluses; no penetration nor aspiration observed with tsp or 5 cc medicine cup sips. No penetration nor aspiration noted with pureed and soft solid textures, though oral phase was prolonged and delayed. Edentulous status exacerbated oral manipulation with soft solid textures. Compensatory techniques of effortful swallow, use of straws, and multiple swallows were trialed and noted to be ineffective. Cued throat clear appeared effective in clearing penetrated/aspirated material, though will need skilled intervention and mass practice to recall and consistently utilize given suspected cognitive impairments.  Given objective findings, recommend continuation of current diet textures (Dysphagia 2 and nectar-thick liquids) with medications crushed in puree with assistance for self-feeding. Continue to recommend pharyngeal strengthening exercises targeting UES relaxation, cough strength + response (RMST), and hyolaryngeal elevation and excursion. Recommend trialing thin liquids via tsp or 5 cc medicine cup/Provale cup with SLP to assess tolerance and train pt on use of cough/throat clear, re-swallow technique. For comfort, pt may have tsp sips of thin water following oral care. Will educate pt and family on findings. Factors that may increase risk of adverse event in presence of aspiration ([place] 2021): Factors that may increase risk of adverse event in presence of aspiration ([place] 2021): Reduced cognitive function; Dependence for feeding and/or oral hygiene Recommendations/Plan: Swallowing Evaluation Recommendations Swallowing Evaluation Recommendations Recommendations: PO  diet PO Diet Recommendation: Dysphagia 2 (Finely chopped); Mildly thick liquids (Level 2, nectar thick) Liquid Administration via: Cup; Straw Medication Administration: Whole meds with puree Supervision: Patient able to self-feed; Staff to assist with self-feeding; Full supervision/cueing for swallowing strategies Postural changes: Position pt fully upright for meals; Stay upright 30-60 min after meals Oral care recommendations: Oral care BID (2x/day) Caregiver Recommendations: Order thickener from pharmacy Treatment Plan Treatment Plan Treatment recommendations: Therapy as outlined in treatment plan below Follow-up recommendations: Home health SLP Functional status assessment: Patient has had a recent decline in their functional status and/or demonstrates limited ability to make significant improvements in function in a reasonable and predictable amount of time. Interventions: Aspiration precaution training; Oropharyngeal exercises; Compensatory techniques; Patient/family education; Trials of upgraded texture/liquids; Diet toleration management by SLP Recommendations Recommendations for follow up therapy are one component of a multi-disciplinary discharge planning process, led by the attending physician.  Recommendations may be updated based on patient status, additional functional criteria and insurance authorization. Assessment: Orofacial Exam: Orofacial Exam Oral Cavity: Oral Hygiene: WFL Oral Cavity - Dentition: Edentulous Anatomy: Anatomy: Presence of cervical hardware Thin Liquids: Thin Liquids (Level 0) Thin Liquids : Impaired Bolus delivery method: Cup Thin Liquid - Impairment: Oral Impairment; Pharyngeal  impairment; Esophageal impairment Lip Closure: Interlabial escape, no progression to anterior lip Tongue control during bolus hold: Cohesive bolus between tongue to palatal seal Bolus transport/lingual motion: Delayed initiation of tongue motion (oral holding) Oral residue: Trace residue lining oral structures Location of oral residue : Palate Initiation of swallow : Pyriform sinuses Soft palate elevation: Complete Laryngeal elevation: Partial or minimal superior movement and approximation Anterior hyoid excursion: Partial Epiglottic movement: Complete Laryngeal vestibule closure: Incomplete Pharyngeal stripping wave : Present - diminished Pharyngeal contraction (A/P view only): N/A Pharyngoesophageal segment opening: Partial distention/partial duration, partial obstruction of flow Tongue base retraction: No contrast between tongue base and posterior pharyngeal wall (PPW) Pharyngeal residue: Collection of residue within or on pharyngeal structures Location of pharyngeal residue: Diffuse (>3 areas) Penetration/Aspiration Scale (PAS) score: 8.  Material enters airway, passes BELOW cords without attempt by patient to eject out (silent aspiration); 1.  Material does not enter airway; 5.  Material enters airway, CONTACTS cords and not ejected out Esophageal impairment: Esophageal retention with retrograde flow through the PES  Mildly Thick Liquids: Mildly thick liquids (Level 2, nectar thick) Mildly thick liquids (Level 2, nectar thick): Impaired Bolus delivery method: Cup Mildly Thick Liquid - Impairment: Oral Impairment; Pharyngeal impairment; Esophageal impairment Lip Closure: Interlabial escape, no progression to anterior lip Tongue control during bolus hold: Cohesive bolus between tongue to palatal seal Bolus transport/lingual motion: Delayed initiation of tongue motion (oral holding) Oral residue: Trace residue lining oral structures Location of oral residue : Palate Initiation of swallow : Valleculae Soft palate  elevation: Complete Anterior hyoid excursion: Partial Epiglottic movement: Partial Laryngeal vestibule closure: Incomplete Pharyngeal stripping wave : Present - diminished Pharyngeal contraction (A/P view only): N/A Pharyngoesophageal segment opening: Partial distention/partial duration, partial obstruction of flow Tongue base retraction: No contrast between tongue base and posterior pharyngeal wall (PPW) Pharyngeal residue: Collection of residue within or on pharyngeal structures Location of pharyngeal residue: Diffuse (>3 areas) Penetration/Aspiration Scale (PAS) score: 2.  Material enters airway, remains ABOVE vocal cords then ejected out; 1.  Material does not enter airway Esophageal impairment: Esophageal retention with retrograde flow through the PES  Moderately Thick Liquids: Moderately thick liquids (Level 3, honey thick) Moderately thick liquids (Level 3, honey thick): Impaired Bolus delivery method:  Spoon Moderately Thick Liquid - Impairment: Oral Impairment; Pharyngeal impairment; Esophageal impairment Lip Closure: Interlabial escape, no progression to anterior lip Tongue control during bolus hold: Cohesive bolus between tongue to palatal seal Bolus transport/lingual motion: Delayed initiation of tongue motion (oral holding) Oral residue: Trace residue lining oral structures Location of oral residue : Tongue; Palate Initiation of swallow : Valleculae Soft palate elevation: Complete Laryngeal elevation: Partial or minimal superior movement and approximation Anterior hyoid excursion: Partial Epiglottic movement: Partial Laryngeal vestibule closure: Incomplete Pharyngeal stripping wave : Present - diminished Pharyngeal contraction (A/P view only): N/A Pharyngoesophageal segment opening: Partial distention/partial duration, partial obstruction of flow Tongue base retraction: No contrast between tongue base and posterior pharyngeal wall (PPW) Pharyngeal residue: Collection of residue within or on pharyngeal  structures Location of pharyngeal residue: Valleculae; Pyriform sinuses Penetration/Aspiration Scale (PAS) score: 2.  Material enters airway, remains ABOVE vocal cords then ejected out Esophageal impairment: Esophageal retention with retrograde flow through the PES  Puree: Puree Puree: Impaired Puree - Impairment: Oral Impairment; Pharyngeal impairment; Esophageal impairment Lip Closure: No labial escape Bolus transport/lingual motion: Delayed initiation of tongue motion (oral holding) Oral residue: Trace residue lining oral structures Location of oral residue : Palate; Tongue Initiation of swallow: Valleculae Soft palate elevation: Complete Laryngeal elevation: Partial or minimal superior movement and approximation Anterior hyoid excursion: Partial Epiglottic movement: Complete Laryngeal vestibule closure: Complete: No air/contrast in laryngeal vestibule Pharyngeal stripping wave : Present - diminished Pharyngoesophageal segment opening: Partial distention/partial duration, partial obstruction of flow Tongue base retraction: No contrast between tongue base and posterior pharyngeal wall (PPW) Pharyngeal residue: Collection of residue within or on pharyngeal structures Location of pharyngeal residue: Valleculae; Pyriform sinuses Penetration/Aspiration Scale (PAS) score: 1.  Material does not enter airway Esophageal impairment: Esophageal retention with retrograde flow through the PES Solid: Solid Solid: Impaired Solid - Impairment: Oral Impairment; Pharyngeal impairment; Esophageal impairment Lip Closure: No labial escape Bolus preparation/mastication: Slow prolonged chewing/mashing with complete recollection Bolus transport/lingual motion: Delayed initiation of tongue motion (oral holding) Oral residue: Trace residue lining oral structures Location of oral residue : Palate Initiation of swallow: Valleculae Soft palate elevation: Complete Laryngeal elevation: Partial or minimal superior movement and approximation  Anterior hyoid excursion: Partial Epiglottic movement: Complete Laryngeal vestibule closure: Complete: No air/contrast in laryngeal vestibule Pharyngeal stripping wave : Present - diminished Pharyngeal contraction (A/P view only): N/A Pharyngoesophageal segment opening: Partial distention/partial duration, partial obstruction of flow Tongue base retraction: No contrast between tongue base and posterior pharyngeal wall (PPW) Pharyngeal residue: Collection of residue within or on pharyngeal structures Location of pharyngeal residue: Valleculae; Pyriform sinuses Penetration/Aspiration Scale (PAS) score: 1.  Material does not enter airway Esophageal impairment: Esophageal retention with retrograde flow through the PES Pill: Pill Pill: Not Tested Compensatory Strategies: Compensatory Strategies Compensatory strategies: Yes Straw: Ineffective Ineffective Straw: Thin liquid (Level 0) Effortful swallow: Ineffective Ineffective Effortful Swallow: Thin liquid (Level 0) Multiple swallows: Ineffective Ineffective Multiple Swallows: Thin liquid (Level 0)   General Information: Caregiver present: No  Diet Prior to this Study: Dysphagia 2 (finely chopped); Mildly thick liquids (Level 2, nectar thick)   Temperature : Normal   Respiratory Status: WFL   Supplemental O2: None (Room air)   History of Recent Intubation: No  Behavior/Cognition: Alert; Requires cueing; Cooperative Self-Feeding Abilities: Needs assist with self-feeding Baseline vocal quality/speech: Normal No data recorded Volitional Swallow: Able to elicit Exam Limitations: Other (comment) (Curvature of cervical spine - head forward posture at rest) Goal Planning: Prognosis for improved oropharyngeal function:  Good Barriers to Reach Goals: Cognitive deficits; Time post onset; Behavior No data recorded Patient/Family Stated Goal: nothing stated Consulted and agree with results and recommendations: Pt unable/family or caregiver not available; Other (comment) (SLP) Pain:  Pain Assessment Pain Assessment: 0-10 Pain Score: 0 Faces Pain Scale: 6 Breathing: 1 Negative Vocalization: 1 Facial Expression: 1 Body Language: 1 Consolability: 0 PAINAD Score: 4 Pain Location: RUE with PROM elbow extension, forearm supination, and shoulder ROM Pain Descriptors / Indicators: Discomfort; Grimacing Pain Intervention(s): Limited activity within patient's tolerance; Monitored during session; Repositioned End of Session: Start Time:SLP Start Time (ACUTE ONLY): 0915 Stop Time: SLP Stop Time (ACUTE ONLY): 0930 Time Calculation:SLP Time Calculation (min) (ACUTE ONLY): 15 min Charges: SLP Evaluations $ SLP Speech Visit: 1 Visit SLP Evaluations $BSS Swallow: 1 Procedure $MBS Swallow: 1 Procedure $ SLP EVAL LANGUAGE/SOUND PRODUCTION: 1 Procedure $Swallowing Treatment: 1 Procedure SLP visit diagnosis: SLP Visit Diagnosis: Cognitive communication deficit (R41.841); Aphasia (R47.01); Dysphagia, oropharyngeal phase (R13.12) Past Medical History: Past Medical History: Diagnosis Date  Bipolar 1 disorder (HCC)   depression/anxiety  Bone spur   left heel  Cervical radiculopathy   Chronic back pain   Chronic chest wall pain   Chronic neck pain   Coronary artery disease   a. s/p CABG in 03/2016 with LIMA-LAD, SVG-D1, SVG-RCA, and Seq SVG-mid and distal OM  Diabetes mellitus   Type II  Hallucinations   "long history of them"  Headache(784.0)   History of gout   HTN (hypertension)   Insomnia   Neuropathy   Pain management   Polysubstance abuse ([place])   Right leg pain   chronic  Schizophrenia ([place])   Seizures ([place])   last sz between July 5-9th, 2016; epilepsy  Sleep apnea   Stroke ([place])   Transfusion of blood product refused for religious reason  Past Surgical History: Past Surgical History: Procedure Laterality Date  ANTERIOR CERVICAL DECOMP/DISCECTOMY FUSION N/A 12/14/2015  Procedure: ANTERIOR CERVICAL DECOMPRESSION/DISCECTOMY FUSION CERVICAL FIVE -SIX;  Surgeon: Earnie Larsson, MD;  [facility]: [facility];  Service:  Neurosurgery;  Laterality: N/A;  BIOPSY  05/07/2015  Procedure: BIOPSY (Gastric);  Surgeon: [name], MD;  Location: [facility];  Service: Endoscopy;;  CARDIAC CATHETERIZATION N/A 03/07/2016  Procedure: Left Heart Cath and Coronary Angiography;  Surgeon: Belva Crome, MD;  Location: [place] [facility];  Service: Cardiovascular;  Laterality: N/A;  COLONOSCOPY WITH PROPOFOL N/A 05/07/2015  [id number] diverticulosis, multiple colon polyps removed, tubular adenoma, serrated colon polyp. Next colonoscopy October 2019  COLONOSCOPY WITH PROPOFOL N/A 08/02/2018  Procedure: COLONOSCOPY WITH PROPOFOL;  Surgeon: [name], MD;  [facility];  Service: Endoscopy;  Laterality: N/A;  12:00pm  CORONARY ARTERY BYPASS GRAFT N/A 03/28/2016  Procedure: CORONARY ARTERY BYPASS GRAFTING (CABG) x 5 USING GREATER SAPHENOUS VEIN;  Surgeon: [name], MD;  Location: [place] [facility];  Service: Open Heart Surgery;  Laterality: N/A;  ENDOVEIN HARVEST OF GREATER SAPHENOUS VEIN Right 03/28/2016  Procedure: ENDOVEIN HARVEST OF GREATER SAPHENOUS VEIN;  Surgeon: [name], MD;  Location: [place];  Service: Open Heart Surgery;  Laterality: Right;  ESOPHAGEAL DILATION N/A 05/07/2015  Procedure: ESOPHAGEAL DILATION WITH 56FR MALONEY DILATOR;  Surgeon: [name], MD;  [facility];  Service: Endoscopy;  Laterality: N/A;  ESOPHAGOGASTRODUODENOSCOPY (EGD) WITH PROPOFOL N/A 05/07/2015  RMR: Status post dilation of normal esophagus. Gastritis.  IR CT HEAD LTD  12/17/2020  IR CT HEAD LTD  12/17/2020  IR INTRA CRAN STENT  12/17/2020  IR PERCUTANEOUS ART THROMBECTOMY/INFUSION INTRACRANIAL INC DIAG ANGIO  12/17/2020  IR RADIOLOGIST EVAL & MGMT  03/25/2021  KNEE SURGERY Left   arthroscopy  MANDIBLE FRACTURE SURGERY    POLYPECTOMY  05/07/2015  Procedure: POLYPECTOMY (Hepatic Flexure, Distal Transverse Colon, Rectal);  Surgeon: [name], MD;  [facility];  Service: Endoscopy;;  RADIOLOGY WITH ANESTHESIA N/A 12/16/2020  Procedure: IR WITH  ANESTHESIA;  Surgeon: Luanne Bras, MD;  Location: [place];  Service: Radiology;  Laterality: N/A;  TEE WITHOUT CARDIOVERSION N/A 03/28/2016  Procedure: TRANSESOPHAGEAL ECHOCARDIOGRAM (TEE);  Surgeon: [name], MD;  Location: [place];  Service: Open Heart Surgery;  Laterality: N/A;  TONSILLECTOMY    TRIGGER FINGER RELEASE Left 10/15/2021  Procedure: RELEASE TRIGGER FINGER/A-1 PULLEY left middle or long finger;  Surgeon: Carole Civil, MD;  [facility];  Service: Orthopedics;  Laterality: Left; Bethany A Lutes 09/14/2022, 3:26 PM  DG Ankle 2 Views Left  Result Date: 09/12/2022 CLINICAL DATA:  Chronic pain of the LEFT ankle. EXAM: LEFT ANKLE - 2 VIEW COMPARISON:  06/17/2020 FINDINGS: There is no evidence of fracture, dislocation, or joint effusion. There is no evidence of arthropathy or other focal bone abnormality. Soft tissues are unremarkable. IMPRESSION: Negative. Electronically Signed   By: Nolon Nations M.D.   On: 09/12/2022 09:47   DG Shoulder Right Port  Result Date: 09/01/2022 CLINICAL DATA:  Right shoulder pain. EXAM: RIGHT SHOULDER - 1 VIEW COMPARISON:  Right shoulder radiographs 12/15/2021 FINDINGS: Mild acromioclavicular joint space narrowing and peripheral osteophytosis, similar to prior. The glenohumeral joint space is maintained. No acute fracture or dislocation. The visualized portion of the right lung is unremarkable. Status post median sternotomy. Mild-to-moderate calcification within the aortic arch. IMPRESSION: Mild acromioclavicular osteoarthritis. Electronically Signed   By: Yvonne Kendall M.D.   On: 09/01/2022 15:08   DG Swallowing Func-Speech Pathology  Result Date: 08/30/2022 Table formatting from the original result was not included. Objective Swallowing Evaluation: Type of Study: MBS-Modified Barium Swallow Study  Patient Details Name: Melvin Taylor MRN: [id number] Date of Birth: 03/06/1966 Today's Date: 08/30/2022 Time: SLP Start Time (ACUTE ONLY): [id number] -SLP  Stop Time (ACUTE ONLY): [id number] SLP Time Calculation (min) (ACUTE ONLY): 20 min Past Medical History: Past Medical History: Diagnosis Date  Bipolar 1 disorder ([place])   depression/anxiety  Bone spur   left heel  Cervical radiculopathy   Chronic back pain   Chronic chest wall pain   Chronic neck pain   Coronary artery disease   a. s/p CABG in 03/2016 with LIMA-LAD, SVG-D1, SVG-RCA, and Seq SVG-mid and distal OM  Diabetes mellitus   Type II  Hallucinations   "long history of them"  Headache(784.0)   History of gout   HTN (hypertension)   Insomnia   Neuropathy   Pain management   Polysubstance abuse ([place])   Right leg pain   chronic  Schizophrenia ([place])   Seizures ([place])   last sz between July 5-9th, 2016; epilepsy  Sleep apnea   Stroke ([place])   Transfusion of blood product refused for religious reason  Past Surgical History: Past Surgical History: Procedure Laterality Date  ANTERIOR CERVICAL DECOMP/DISCECTOMY FUSION N/A 12/14/2015  Procedure: ANTERIOR CERVICAL DECOMPRESSION/DISCECTOMY FUSION CERVICAL FIVE -SIX;  Surgeon: Earnie Larsson, MD;  [facility];  Service: Neurosurgery;  Laterality: N/A;  BIOPSY  05/07/2015  Procedure: BIOPSY (Gastric);  Surgeon: [name], MD;  [facility];  Service: Endoscopy;;  CARDIAC CATHETERIZATION N/A 03/07/2016  Procedure: Left Heart Cath  and Coronary Angiography;  Surgeon: Belva Crome, MD;  Location: [place] [facility];  Service: Cardiovascular;  Laterality: N/A;  COLONOSCOPY WITH PROPOFOL N/A 05/07/2015  [id number] diverticulosis, multiple colon polyps removed, tubular adenoma, serrated colon polyp. Next colonoscopy October 2019  COLONOSCOPY WITH PROPOFOL N/A 08/02/2018  Procedure: COLONOSCOPY WITH PROPOFOL;  Surgeon: [name], MD;  [facility];  Service: Endoscopy;  Laterality: N/A;  12:00pm  CORONARY ARTERY BYPASS GRAFT N/A 03/28/2016  Procedure: CORONARY ARTERY BYPASS GRAFTING (CABG) x 5 USING GREATER SAPHENOUS VEIN;  Surgeon: [name], MD;   Location: [place] [facility];  Service: Open Heart Surgery;  Laterality: N/A;  ENDOVEIN HARVEST OF GREATER SAPHENOUS VEIN Right 03/28/2016  Procedure: ENDOVEIN HARVEST OF GREATER SAPHENOUS VEIN;  Surgeon: [name], MD;  Location: [place];  Service: Open Heart Surgery;  Laterality: Right;  ESOPHAGEAL DILATION N/A 05/07/2015  Procedure: ESOPHAGEAL DILATION WITH 56FR MALONEY DILATOR;  Surgeon: [name], MD;  [facility];  Service: Endoscopy;  Laterality: N/A;  ESOPHAGOGASTRODUODENOSCOPY (EGD) WITH PROPOFOL N/A 05/07/2015  RMR: Status post dilation of normal esophagus. Gastritis.  IR CT HEAD LTD  12/17/2020  IR CT HEAD LTD  12/17/2020  IR INTRA CRAN STENT  12/17/2020  IR PERCUTANEOUS ART THROMBECTOMY/INFUSION INTRACRANIAL INC DIAG ANGIO  12/17/2020  IR RADIOLOGIST EVAL & MGMT  03/25/2021  KNEE SURGERY Left   arthroscopy  MANDIBLE FRACTURE SURGERY    POLYPECTOMY  05/07/2015  Procedure: POLYPECTOMY (Hepatic Flexure, Distal Transverse Colon, Rectal);  Surgeon: [name], MD;  [facility];  Service: Endoscopy;;  RADIOLOGY WITH ANESTHESIA N/A 12/16/2020  Procedure: IR WITH ANESTHESIA;  Surgeon: Luanne Bras, MD;  Location: [place];  Service: Radiology;  Laterality: N/A;  TEE WITHOUT CARDIOVERSION N/A 03/28/2016  Procedure: TRANSESOPHAGEAL ECHOCARDIOGRAM (TEE);  Surgeon: [name], MD;  Location: [place];  Service: Open Heart Surgery;  Laterality: N/A;  TONSILLECTOMY    TRIGGER FINGER RELEASE Left 10/15/2021  Procedure: RELEASE TRIGGER FINGER/A-1 PULLEY left middle or long finger;  Surgeon: Carole Civil, MD;  [facility];  Service: Orthopedics;  Laterality: Left; HPI: 57 y.o. male with medical history significant of bipolar d/o vs/ schizophrenia, chronic chest wall/neck/back pain, CAD s/p CABG, DM, HTN, polysubstance abuse, OSA, and seizure d/o presenting with AMS.  He was admitted to [facility] from 1/20-27 with AMS, minimally improving during hospitalization.  He was noted to have a persistent punctate focus of  restricted diffusion in the L medial temporal lobe, concerning for acute/subacute CVA.  MRI/CT with SDH that developed during the hospitalization, 4 mm in L occipital region.  He has been on IV Keppra throughout the hospitalization without apparent seizure activity.  He is able to answer very limited questions, none about orientation.  He does follow some commands  Subjective: Alert, cooperative  Recommendations for follow up therapy are one component of a multi-disciplinary discharge planning process, led by the attending physician.  Recommendations may be updated based on patient status, additional functional criteria and insurance authorization. Assessment / Plan / Recommendation   08/30/2022   5:48 PM Clinical Impressions Clinical Impression Patient presents with a mild oral and a mild-moderate pharyngeal phase dysphagia as per this MBS. During oral phase, patient exhibited weak lingual manipulation and mastication leading to delays in bolus formation and transit. During pharyngeal phase, patient with vallecular residuals with all consistencies as well as pyriform sinus residuals with honey thick, nectar thick and thin liquids. Residuals started to clear with subsequent swallows. No  penetration or aspiration observed with teaspoon sips of nectar, thin or honey thick liquids. During consecutive cup sips with thin liquids and nectar thick liquids, patient with reduced epiglottic inversion leading to penetration above vocal cords. In addition, airway closure was adequate with nectar thick liquids even during concecutive sips. Aspiration observed during the swallow with consecutive sips of thin liquids and although it was trace to mild in amount, patient did not sense. (silent aspiration). SLP recommending to continue with Dys 2 solids, nectar thick liquids at this time and will trial thin liquids at bedside. SLP Visit Diagnosis Dysphagia, oropharyngeal phase (R13.12) Impact on safety and function Mild aspiration  risk;Moderate aspiration risk     08/30/2022   5:48 PM Treatment Recommendations Treatment Recommendations Therapy as outlined in treatment plan below     08/30/2022   5:55 PM Prognosis Prognosis for Safe Diet Advancement Good Barriers to Reach Goals Severity of deficits;Time post onset   08/30/2022   5:48 PM Diet Recommendations SLP Diet Recommendations Dysphagia 2 (Fine chop) solids;Nectar thick liquid Liquid Administration via Cup;Straw Medication Administration Whole meds with puree Compensations Slow rate;Small sips/bites;Minimize environmental distractions;Lingual sweep for clearance of pocketing Postural Changes Seated upright at 90 degrees     08/30/2022   5:48 PM Other Recommendations Oral Care Recommendations Oral care BID Other Recommendations Order thickener from pharmacy Follow Up Recommendations Acute inpatient rehab (3hours/day) Functional Status Assessment Patient has had a recent decline in their functional status and demonstrates the ability to make significant improvements in function in a reasonable and predictable amount of time.   08/30/2022   5:48 PM Frequency and Duration  Speech Therapy Frequency (ACUTE ONLY) min 2x/week Treatment Duration 2 weeks     08/30/2022   5:43 PM Oral Phase Oral Phase Impaired Oral - Honey Teaspoon Reduced posterior propulsion;Weak lingual manipulation Oral - Nectar Teaspoon Weak lingual manipulation;Reduced posterior propulsion Oral - Nectar Cup Weak lingual manipulation;Reduced posterior propulsion Oral - Thin Teaspoon Weak lingual manipulation;Holding of bolus Oral - Thin Cup Premature spillage Oral - Puree Reduced posterior propulsion;Weak lingual manipulation Oral - Mech Soft Reduced posterior propulsion;Weak lingual manipulation;Piecemeal swallowing;Impaired mastication    08/30/2022   5:45 PM Pharyngeal Phase Pharyngeal Phase Impaired Pharyngeal- Honey Teaspoon Pharyngeal residue - valleculae;Pharyngeal residue - pyriform Pharyngeal- Nectar Teaspoon Pharyngeal  residue - valleculae;Pharyngeal residue - pyriform Pharyngeal- Nectar Cup Delayed swallow initiation-pyriform sinuses;Reduced airway/laryngeal closure;Reduced epiglottic inversion;Penetration/Aspiration during swallow;Pharyngeal residue - valleculae;Pharyngeal residue - pyriform Pharyngeal Material enters airway, remains ABOVE vocal cords then ejected out;Material enters airway, remains ABOVE vocal cords and not ejected out Pharyngeal- Thin Teaspoon Pharyngeal residue - valleculae;Pharyngeal residue - pyriform Pharyngeal- Thin Cup Pharyngeal residue - valleculae;Pharyngeal residue - pyriform;Penetration/Aspiration during swallow;Reduced epiglottic inversion;Reduced airway/laryngeal closure Pharyngeal Material enters airway, passes BELOW cords without attempt by patient to eject out (silent aspiration) Pharyngeal- Puree Reduced pharyngeal peristalsis;Reduced tongue base retraction;Pharyngeal residue - valleculae;Pharyngeal residue - pyriform    08/30/2022   5:48 PM Cervical Esophageal Phase  Cervical Esophageal Phase Impaired Cervical Esophageal Comment barium retention above UES Sonia Baller, MA, CCC-SLP Speech Therapy                     EEG adult  Result Date: 08/29/2022 Lora Havens, MD     08/29/2022 10:26 AM Patient Name: Melvin Taylor MRN: [id number] Epilepsy Attending: Lora Havens Referring Physician/Provider: Katy Apo, NP Date: 08/28/2022 Duration: 21.18 mins Patient history: 57yo M with ams. EEG to evaluate for seizure Level of  alertness: Awake AEDs during EEG study: None Technical aspects: This EEG study was done with scalp electrodes positioned according to the 10-20 International system of electrode placement. Electrical activity was reviewed with band pass filter of 1-70Hz$ , sensitivity of 7 uV/mm, display speed of 65m/sec with a 60Hz$  notched filter applied as appropriate. EEG data were recorded continuously and digitally stored.  Video monitoring was available and  reviewed as appropriate. Description: EEG showed continuous generalized 3 to 6 Hz theta-delta slowing. Hyperventilation and photic stimulation were not performed.   ABNORMALITY - Continuous slow, generalized IMPRESSION: This study is suggestive of moderate to severe diffuse encephalopathy, nonspecific etiology. No seizures or epileptiform discharges were seen throughout the recording. [facility]  CT HEAD WO CONTRAST (5MM)  Result Date: 08/28/2022 CLINICAL DATA:  Stroke, hemorrhagic EXAM: CT HEAD WITHOUT CONTRAST TECHNIQUE: Contiguous axial images were obtained from the base of the skull through the vertex without intravenous contrast. RADIATION DOSE REDUCTION: This exam was performed according to the departmental dose-optimization program which includes automated exposure control, adjustment of the mA and/or kV according to patient size and/or use of iterative reconstruction technique. COMPARISON:  CTA head and neck 08/26/2022. MR head without and with contrast 08/25/2022 at [facility] Brain: Trace subdural hematoma along the posterior falx and posterior the occipital lobe is stable. No new hemorrhage is present. Remote infarcts of the left basal ganglia, anterior left frontal lobe and corona radiata are stable. No acute infarct is present. Ex vacuo dilation of the left lateral ventricle is present. The brainstem and cerebellum are within normal limits. Midline structures are within normal limits. Vascular: Left MCA stent is stable. Atherosclerotic calcifications are present within the cavernous internal carotid arteries. No other hyperdense vessel is present. Skull: Calvarium is intact. No focal lytic or blastic lesions are present. No significant extracranial soft tissue lesion is present. Sinuses/Orbits: A left mastoid effusion is present. The paranasal sinuses and mastoid air cells are otherwise clear. The globes and orbits are within normal limits. IMPRESSION: 1. Stable trace subdural  hematoma along the posterior falx and posterior the occipital lobe. 2. No new hemorrhage. 3. 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 4. Stable left MCA stent. 5. Left mastoid effusion. The above was relayed via text pager to Dr. EKerney Elbeon 08/28/2022 at 13:31 . Electronically Signed   By: CSan MorelleM.D.   On: 08/28/2022 13:32   ECHOCARDIOGRAM COMPLETE  Result Date: 08/28/2022    ECHOCARDIOGRAM REPORT   Patient Name:   Melvin SPERRAZZADate of Exam: 08/28/2022 Medical Rec #:  [id number]        Height:       65.0 in Accession #:    [id number]       Weight:       116.6 lb Date of Birth:  806/03/1966         BSA:          1.573 m Patient Age:    89years          BP:           130/84 mmHg Patient Gender: M                 HR:           56 bpm. Exam Location:  Inpatient Procedure: 2D Echo, Cardiac Doppler and Color Doppler Indications:    I63.9 Stroke  History:  Patient has prior history of Echocardiogram examinations, most                 recent 12/17/2020. Stroke; Risk Factors:Sleep Apnea, Diabetes,                 Dyslipidemia and Former Smoker.  Sonographer:    Wilkie Aye RVT RCS Referring Phys: [id number] JENNIFER YATES  Sonographer Comments: Image acquisition challenging due to uncooperative patient and Patient unable to turn; images off axis; patient kept pushing Techs hand away, exam ended. IMPRESSIONS  1. Left ventricular ejection fraction, by estimation, is 60 to 65%. The left ventricle has normal function. The left ventricle has no regional wall motion abnormalities. There is mild left ventricular hypertrophy. Left ventricular diastolic parameters are consistent with Grade I diastolic dysfunction (impaired relaxation).  2. Right ventricular systolic function is normal. The right ventricular size is normal.  3. The mitral valve is normal in structure. Mild mitral valve regurgitation. No evidence of mitral stenosis.  4. The aortic valve  is tricuspid. Aortic valve regurgitation is not visualized. Aortic valve sclerosis is present, with no evidence of aortic valve stenosis.  5. The inferior vena cava is normal in size with greater than 50% respiratory variability, suggesting right atrial pressure of 3 mmHg. Comparison(s): EF 65-70%. FINDINGS  Left Ventricle: Left ventricular ejection fraction, by estimation, is 60 to 65%. The left ventricle has normal function. The left ventricle has no regional wall motion abnormalities. The left ventricular internal cavity size was normal in size. There is  mild left ventricular hypertrophy. Left ventricular diastolic parameters are consistent with Grade I diastolic dysfunction (impaired relaxation). Right Ventricle: The right ventricular size is normal. Right ventricular systolic function is normal. Left Atrium: Left atrial size was normal in size. Right Atrium: Right atrial size was normal in size. Pericardium: There is no evidence of pericardial effusion. Mitral Valve: The mitral valve is normal in structure. Mild mitral valve regurgitation. No evidence of mitral valve stenosis. Tricuspid Valve: The tricuspid valve is normal in structure. Tricuspid valve regurgitation is mild . No evidence of tricuspid stenosis. Aortic Valve: The aortic valve is tricuspid. Aortic valve regurgitation is not visualized. Aortic valve sclerosis is present, with no evidence of aortic valve stenosis. Aortic valve mean gradient measures 1.0 mmHg. Aortic valve peak gradient measures 2.6  mmHg. Aortic valve area, by VTI measures 4.70 cm. Pulmonic Valve: The pulmonic valve was normal in structure. Pulmonic valve regurgitation is trivial. No evidence of pulmonic stenosis. Aorta: The aortic root is normal in size and structure. Venous: The inferior vena cava is normal in size with greater than 50% respiratory variability, suggesting right atrial pressure of 3 mmHg. IAS/Shunts: The interatrial septum is aneurysmal. No atrial level shunt  detected by color flow Doppler.  LEFT VENTRICLE PLAX 2D LVIDd:         3.70 cm   Diastology LVIDs:         2.80 cm   LV e' medial:   6.76 cm/s LV PW:         1.35 cm   LV E/e' medial: 9.0 LV IVS:        1.15 cm LVOT diam:     2.30 cm LV SV:         64 LV SV Index:   41 LVOT Area:     4.15 cm  RIGHT VENTRICLE RV S prime:     9.41 cm/s LEFT ATRIUM         Index LA  diam:    2.90 cm 1.84 cm/m  AORTIC VALVE                    PULMONIC VALVE AV Area (Vmax):    4.08 cm     PV Vmax:          0.75 m/s AV Area (Vmean):   3.41 cm     PV Peak grad:     2.2 mmHg AV Area (VTI):     4.70 cm     PR End Diast Vel: 4.33 msec AV Vmax:           80.50 cm/s AV Vmean:          55.100 cm/s AV VTI:            0.137 m AV Peak Grad:      2.6 mmHg AV Mean Grad:      1.0 mmHg LVOT Vmax:         79.00 cm/s LVOT Vmean:        45.200 cm/s LVOT VTI:          0.155 m LVOT/AV VTI ratio: 1.13  AORTA Ao Root diam: 3.70 cm MITRAL VALVE               TRICUSPID VALVE MV Area (PHT): 2.26 cm    TR Peak grad:   26.8 mmHg MV Decel Time: 336 msec    TR Vmax:        259.00 cm/s MV E velocity: 60.60 cm/s MV A velocity: 87.30 cm/s  SHUNTS MV E/A ratio:  0.69        Systemic VTI:  0.16 m                            Systemic Diam: 2.30 cm Kirk Ruths MD Electronically signed by Kirk Ruths MD Signature Date/Time: 08/28/2022/12:15:17 PM    Final    DG CHEST PORT 1 VIEW  Result Date: 08/27/2022 CLINICAL DATA:  PICC line placement EXAM: PORTABLE CHEST 1 VIEW COMPARISON:  Chest x-ray August 20, 2022 FINDINGS: A new left PICC line is identified with the distal tip near the caval atrial junction. The tip may be just beyond the caval atrial junction the patient may benefit from pulling the PICC line back 2 cm. The heart, hila, and mediastinum are unremarkable. No pneumothorax. No nodules, masses, or focal infiltrates. IMPRESSION: The new left PICC line is in good position. The tip may be just beyond the caval atrial junction. The patient may benefit from  pulling the PICC line back 2 cm. These results will be called to the ordering clinician or representative by the Radiologist Assistant, and communication documented in the PACS or [employer]. Electronically Signed   By: Dorise Bullion III M.D.   On: 08/27/2022 16:04    Labs:  Basic Metabolic Panel: Recent Labs  Lab 09/09/22 0529 09/12/22 0924 09/14/22 1053  NA 136 133* 133*  K 4.4 5.1 4.8  CL 104 102 101  CO2 23 27 24  $ GLUCOSE 60* 89 106*  BUN 15 30* 37*  CREATININE 1.11 1.63* 1.88*  CALCIUM 8.5* 8.6* 8.7*    CBC: Recent Labs  Lab 09/12/22 0924  WBC 3.4*  HGB 8.5*  HCT 25.9*  MCV 89.3  PLT 200    CBG: Recent Labs  Lab 09/14/22 0630 09/14/22 0650 09/14/22 0730 09/14/22 1554 09/14/22 2124  GLUCAP 56* 59* 93 84 133*  Family history.  Father with diabetes and hypertension Sister with CVA.  Denies any colon cancer esophageal cancer or rectal cancer  Brief HPI:   Melvin Taylor is a 57 y.o. right-handed male with history of bipolar disorder/schizophrenia, CAD with CABG diabetes mellitus hypertension polysubstance abuse OSA and will not use CPAP seizure disorder/epilepsy maintained on Depakote as well as Lamictal 1 well as left MCA infarction with stenting maintained on Brilinta and Plavix receiving inpatient rehab services 12/21/2020 - 12/30/2020.  He was discharged to home ambulating without assistive device.  Per chart review lives alone.  He has a home health aide 53 hours a week to assist with making meals dressing bathing and managing medications.  Sister does finances and has POA.  Presented 08/27/2022 from outside hospital after recent admission with altered mental status.  He was noted to have persistent punctate focus of restricted diffusion in the left temporal medial lobe concerning for acute/subacute CVA on imaging at outside hospital.  MRI showed trace SDH that developed during the hospitalization 4 mm in the left occipital region.  He was transferred to [facility].  Cranial CT scan showed stable trace subdural hematoma along the posterior falx and posteriorly occipital lobe.  Stable remote infarcts of the left basal ganglia, anterior left frontal lobe and corona radiata but no acute CVA noted.  Stable left MCA stent.  Admission chemistries unremarkable except potassium 3.2 BUN 39 hemoglobin 9.2.  Neurology follow-up maintained on Brilinta as prior to admission but Plavix held.  Neurology felt the risk of stent restenosis if there is prolonged cessation of Brilinta would most likely outweigh the risk of hemorrhage.  Neurosurgery signed off due to small size and stable appearance of subdural hematoma.  EEG negative for seizure.  Echocardiogram with ejection fraction of 60 to 65% no wall motion abnormalities grade 1 diastolic dysfunction.  Hospital course COVID questionably incidental finding patient asymptomatic completed antiviral/steroid course and maintained on airborne contact precautions.  He had completed his quarantine 08/31/2022.  Currently on a dysphagia #2 nectar thick liquid diet.  Therapy evaluations completed due to patient decreased functional mobility was admitted for a comprehensive rehab program.   Hospital Course: Melvin Taylor was admitted to rehab 08/31/2022 for inpatient therapies to consist of PT, ST and OT at least three hours five days a week. Past admission physiatrist, therapy team and rehab RN have worked together to provide customized collaborative inpatient rehab.  Pertaining to patient's acute metabolic encephalopathy stable trace subdural hematoma along the posterior falx and posterior occipital lobe.  Stable remote infarcts left basal ganglia anterior left frontal lobe and corona radiata as well as history of left MCA with stenting.  He was cleared to remain on Brilinta.  His Plavix remain on hold he would follow-up neurology services.  Hospital course pain management right shoulder pain mild AC arthritis likely incidental  findings placed on Voltaren gel.  Mood stabilization with history of bipolar disorder/schizophrenia he remained on his Lexapro with Lamictal resumed and Depakote ongoing.  Trial of Provigil during the hospitalization discontinued at discharge.  Patient did receive followed by neuropsychology during his rehabilitation stay.  Lipitor ongoing for hyperlipidemia.  Hospital course incidental finding COVID-19 resolved precautions discontinued patient afebrile.  Mild hyponatremia 129 improved to 133-135 he did receive D5 one half normal saline for short time during his hospital stay.  Patient does have a history of CKD followed by nephrology services.He did receive his Epogen injection as PTA 09/13/2022. Acute on chronic anemia  no bleeding episodes and remained on iron supplement with lattest HGB 8.5.  Blood sugars monitored latest hemoglobin A1c of 5.0 currently on no diabetic agents he had been on Lantus insulin in the past and has since been discontinued by PCP.  By report he was using glimepiride at home only if blood sugar greater than 150.  Patient did have some asymptomatic hypoglycemia occurred when patient missed a meal or refused bedtime snack.  Patient did have recent diagnosis of OSA in January and a home CPAP machine has been ordered for patient.  His oxygen saturations remained greater than 90% on room air.  He remains on dysphagia #2 nectar thick liquid diet followed by speech therapy with las MBS 09/14/2022.  Blood pressure controlled on Imdur as well as low-dose Lopressor   Blood pressures were monitored on TID basis and controlled     Rehab course: During patient's stay in rehab weekly team conferences were held to monitor patient's progress, set goals and discuss barriers to discharge. At admission, patient required minimal assist stand pivot transfers moderate assist ambulation 3 feet  Physical exam.  Blood pressure 150/95 pulse 60 temperature 98.9 respirations 15 oxygen saturation is  100% Constitutional.  No acute distress HEENT Head.  Normocephalic and atraumatic Eyes.  Pupils round and reactive to light no discharge without nystagmus Neck.  Supple nontender no JVD without thyromegaly Cardiac regular rate and rhythm without any extra sounds or murmur heard Abdomen.  Soft nontender positive bowel sounds without rebound Respiratory effort normal no respiratory distress without wheeze Extremities.  No clubbing cyanosis or edema Neurologic.  Alert oriented to person only.  He knows he was in the hospital but unable to provide which hospital or city.  He does not know the date.  Follow commands intermittently.  Cranial nerves II through XII intact difficult to assess. Strength left upper and left lower extremity 4 out of 5 Right extremity 1/5 proximal 2/5 distal Right lower extremity able to lift to gravity ankle dorsiflexion 1/5 MMT appears to be limited by effort DTRs 2+ throughout  He/She  has had improvement in activity tolerance, balance, postural control as well as ability to compensate for deficits. He/She has had improvement in functional use RUE/LUE  and RLE/LLE as well as improvement in awareness.  Supine to sitting edge of bed elevated moderate assist for trunk upright with patient using left upper extremity to assist.  Sitting edge of bed requiring varying contact-guard to total assist for sitting balance.  Gait 140 feet to the day room with minimal assist due to persistent posterior lean.  Patient completed supine to sit with increased time for processing skills needed assist to approach edge of bed during ADLs.  Patient completed bed mobility with bed flattened to simulate home environment.  Patient able initiate bending knees and reaching across body to roll onto side following minimal verbal and tactile cues.  Patient educated on modified lower body dressing techniques completed seated edge of bed.  Patient required assistance to we right lower extremity to pants use  anterior lean technique.  Speech therapy continue to follow patient for dysphagia.  Full family teaching completed plan discharge to home       Disposition: Discharge to home    Diet: Dysphagia #2 nectar thick liquids  Special Instructions: No driving smoking or alcohol  Continue to hold Plavix for now due to small trace SDH  Medications at discharge. 1.  Tylenol as needed 2.  Lipitor 80 mg p.o. nightly 3.  Cyclosporine 0.05% 1 drop both eyes twice daily 4.  Lexapro 20 mg p.o. daily 5.  Ferrous sulfate 325 mg p.o. daily 6.  Imdur 30 mg p.o. daily 7.  Lamictal 50 mg p.o. twice daily 8.  Lopressor 12.5 mg p.o. twice daily 9.  Multivitamin daily 10.  Brilinta 90 mg p.o. twice daily  30-35 minutes were spent completing discharge summary and discharge planning  Discharge Instructions     Ambulatory referral to Neurology   Complete by: As directed    Medical complexity follow-up 1 to 2 weeks acute metabolic encephalopathy with history of CVA and spasticity   Ambulatory referral to Physical Medicine Rehab   Complete by: As directed    Water, laxity follow-up 1 to 2 weeks acute metabolic encephalopathy with history of CVA and spasticity.        Follow-up Information     Kirsteins, Luanna Salk, MD Follow up.   Specialty: Physical Medicine and Rehabilitation Why: Only as directed Contact information: [place] [address] [phone]         Liana Gerold, MD Follow up.   Specialty: Nephrology Why: call for appointment Contact information: [address] [place] [address] [phone]                 Signed: Lavon Paganini [place] 09/15/2022, 5:13 AM

## 2022-09-12 NOTE — Progress Notes (Signed)
Physical Therapy Session Note  Patient Details  Name: Melvin Taylor MRN: [id number] Date of Birth: 1966/07/04  Today's Date: 09/12/2022 PT Individual Time: 1030-1100 PT Individual Time Calculation (min): 30 min   Short Term Goals: Week 2:  PT Short Term Goal 1 (Week 2): = to LTGs based on ELOS  Skilled Therapeutic Interventions/Progress Updates:    Chart reviewed and pt agreeable to therapy. Pt received semi-reclined in bed with no c/o pain. Also of note, pt required significant time, >15 mins, to participate in session. Pt continuously reminded pt of reason for PT and need to promote independent home mobility. Once agreeable, pt sat EOB with ModA. Pt completed seated balance exercises of reaches and sitting with no hand support for 8 mins with CGA and periodic modA during LOBs to left. Pt required more modA for LOB as sitting time increased indicating endurance deficit. Pt then returned to bed with CGA and required CGA for R/L rolling during bed linen change with NT support. At end of session, pt was left semi-reclined in bed with alarm engaged, 4 rails lifted per pt request, nurse call bell and all needs in reach.     Therapy Documentation Precautions:  Precautions Precautions: Fall, Other (comment) Precaution Comments: R hemiplegia with R UE flexor tone and R LE extensor tone (LE tone primarily in the bed), Aphasia, delayed processing Required Braces or Orthoses: Other Brace Other Brace: R UE aircast for tone management Restrictions Weight Bearing Restrictions: No     Therapy/Group: Individual Therapy  Marquette Old, PT, DPT 09/12/2022, 12:21 PM

## 2022-09-13 ENCOUNTER — Other Ambulatory Visit: Payer: Self-pay

## 2022-09-13 LAB — GLUCOSE, CAPILLARY
Glucose-Capillary: 76 mg/dL (ref 70–99)
Glucose-Capillary: 53 mg/dL — ABNORMAL LOW (ref 70–99)
Glucose-Capillary: 54 mg/dL — ABNORMAL LOW (ref 70–99)
Glucose-Capillary: 56 mg/dL — ABNORMAL LOW (ref 70–99)
Glucose-Capillary: 75 mg/dL (ref 70–99)
Glucose-Capillary: 76 mg/dL (ref 70–99)
Glucose-Capillary: 108 mg/dL — ABNORMAL HIGH (ref 70–99)
Glucose-Capillary: 128 mg/dL — ABNORMAL HIGH (ref 70–99)

## 2022-09-13 MED ORDER — GLUCOSE 40 % PO GEL
ORAL | Status: AC
  Filled 2022-09-13: qty 1.21

## 2022-09-13 MED ORDER — DARBEPOETIN ALFA 40 MCG/0.4ML IJ SOSY
40.0000 ug | PREFILLED_SYRINGE | Freq: Once | INTRAMUSCULAR | Status: AC
  Administered 2022-09-13: 40 ug via SUBCUTANEOUS
  Filled 2022-09-13: qty 0.4

## 2022-09-13 MED ORDER — GLUCOSE 40 % PO GEL
2.0000 | ORAL | Status: AC
  Administered 2022-09-13: 62 g via ORAL

## 2022-09-13 MED ORDER — DEXTROSE-NACL 5-0.45 % IV SOLN: INTRAVENOUS | Status: DC

## 2022-09-13 NOTE — Progress Notes (Signed)
Speech Language Pathology Daily Session Note  Patient Details  Name: Melvin Taylor MRN: [id number] Date of Birth: 01/09/1966  Today's Date: 09/13/2022 SLP Individual Time: 1030-1100 SLP Individual Time Calculation (min): 30 min  Short Term Goals: Week 1: SLP Short Term Goal 1 (Week 1): Patient will consume current diet with minimal overt s/sx of aspiration with min A for implementation of swallowing precautions/strategies SLP Short Term Goal 1 - Progress (Week 1): Not met SLP Short Term Goal 2 (Week 1): Patient will participate in further cognitive-linguistic, speech/language evaluation with 100% completion SLP Short Term Goal 2 - Progress (Week 1): Not met SLP Short Term Goal 3 (Week 1): Patient will respond to basic yes/no questions pertaining to environment and biographical information with 50% accuracy given mod A verbal/visual cues SLP Short Term Goal 3 - Progress (Week 1): Discontinued (comment) SLP Short Term Goal 4 (Week 1): Patient will follow 1-step commads with 50% accuracy given mod A verbal/visual cues SLP Short Term Goal 4 - Progress (Week 1): Discontinued (comment) SLP Short Term Goal 5 (Week 1): Patient will name objects with 50% accuracy given max A multimodal cues SLP Short Term Goal 5 - Progress (Week 1): Discontinued (comment) SLP Short Term Goal 6 (Week 1): Patient will orient x4 with max A multimodal cues SLP Short Term Goal 6 - Progress (Week 1): Discontinued (comment)  Skilled Therapeutic Interventions:   Pt seen for skilled SLP session to address dysphagia goals. [name] present for family education. SLP facilitated pharyngeal strengthening exercises including effortful swallow with applesauce x20-25, Shaker head lifts x5, and Chin tuck against resistance x5. [name] verbalized good understanding of how to facilitate swallow exercises. Recommend repeat MBSS prior to pt's discharge from [facility] to objectively determine safest, least restrictive diet  recommendation at this time. Reviewed plan with pt's sister.   Pt left in bed with nursing and sister present and PT entering room for next therapy session. Continue SLP PoC.   Pain Pain Assessment Pain Scale: 0-10 Pain Score: 0-No pain Faces Pain Scale: No hurt  Therapy/Group: Individual Therapy  Wyn Forster 09/13/2022, 12:10 PM

## 2022-09-13 NOTE — Progress Notes (Signed)
Patient ID: Melvin Taylor, male   DOB: 1966/06/19, 57 y.o.   MRN: [id number]  Sw spoke with pt's sister who has expressed concerns of pt d/c home on Thursday. Sister concerned about pt's swallowing and now requiring a IV. Sister requested that pt's has a snack 2 hours after every meal and 2 hours before bedtime.

## 2022-09-13 NOTE — Progress Notes (Signed)
Occupational Therapy Session Note  Patient Details  Name: Melvin Taylor MRN: [id number] Date of Birth: Dec 17, 1965  Today's Date: 09/13/2022 OT Individual Time: [id number] OT Individual Time Calculation (min): 58 min    Short Term Goals: Week 1:  OT Short Term Goal 1 (Week 1): Pt will consistently complete toilet transfer min A sing LRAD OT Short Term Goal 1 - Progress (Week 1): Progressing toward goal OT Short Term Goal 2 (Week 1): Pt will utilize RUE during fucntional tasks ~50% of time with mod cueing OT Short Term Goal 2 - Progress (Week 1): Met OT Short Term Goal 3 (Week 1): Pt will maintain dynamic sitting balance CGA using LRAD OT Short Term Goal 3 - Progress (Week 1): Met OT Short Term Goal 4 (Week 1): Pt will sustain attention during BADL/functional task in quiet environment with mod cueing OT Short Term Goal 4 - Progress (Week 1): Progressing toward goal Week 2:  OT Short Term Goal 1 (Week 2): STG=LTG d/t Pt ELOS  Skilled Therapeutic Interventions/Progress Updates:     Pt received sleeping in bed with head covered. Pt easy to wake receptive to skilled OT session with mod motivation required. Pt presenting more alert this AM, however continuing to require increased time to initiate tasks and participation in session. Pt presenting with pain in RU/LE during initial mobilization this session- offered ret breaks, repositioning, and gentle stretching.   Pt reporting MD came in this morning and informed him "you don't eat enough". OT facilitated discussion around Pt eating habits. Pt reporting he agreed he does not eat enough and that he doesn't feel motivated to eat. Discussed reasons for not wanting to eat with Pt currently unable to verbalize stating "I don't know" to questions regarding food/diet. Pt reporting he does enjoy the vanilla nutritional shakes with nursing staff notified.   Pt sister entering room for family education. Pt provided education on Pt current functional  status and areas requiring increased support such as posterior peri-care during toileting, LB dressing, LB bathing, and assistance with tub and toilet transfers. OT facilitated Pt sister in assisting/cueing Pt with completing LB with Pt able to complete with mod A +time to weave L leg and maintain balance while bringing pants to waist.   Pt sister educated on need for 25/7 supervision and min A required upon d/c home. Sister verbalizing family and hired aide will be able to provided needed assistance at d/c. Pt sister requesting to trial using RW vs. HHA to increase her comfort in assisting Pt. Pt able to ambulate within room using RW with min A. Pt sister educated on cueing and hands on assistance to provide Pt during functional mobility and transfers with demonstration, practice, and feedback provided. Pt able to ambulate >150 ft with sister providing min HHA. Discussed follow up therapy with Pt sister receptive to [facility] at d/c. Recommending tub bench to increase safety during transfers and bathing with sister and Pt receptive. Educated Pt sister on handling, stretching, and positioning of Pt RUE to prevent pain and increase functional use over time with focus on moving through pain free range with slow controled movements.   Pt was left resting in wc with call bell in reach, chair alarm on, and all needs met with sister present in room.   Therapy Documentation Precautions:  Precautions Precautions: Fall, Other (comment) Precaution Comments: R hemiplegia with R UE flexor tone and R LE extensor tone (LE tone primarily in the bed), Aphasia, delayed processing Required Braces or Orthoses:  Other Brace Other Brace: R UE aircast for tone management Restrictions Weight Bearing Restrictions: No General:   ADL: ADL Eating: Not assessed Grooming: Maximal assistance Where Assessed-Grooming: Edge of bed Upper Body Bathing: Moderate assistance Where Assessed-Upper Body Bathing: Edge of bed Lower Body  Bathing: Maximal assistance Where Assessed-Lower Body Bathing: Edge of bed Upper Body Dressing: Maximal assistance Where Assessed-Upper Body Dressing: Edge of bed Lower Body Dressing: Maximal assistance Where Assessed-Lower Body Dressing: Edge of bed Toileting: Dependent Where Assessed-Toileting: Bed level Toilet Transfer: Moderate assistance Toilet Transfer Method: Sit pivot Toilet Transfer Equipment: Engineer, technical sales Transfer: Not assessed Social research officer, government: Moderate assistance Social research officer, government Method: Radiographer, therapeutic: Grab bars, Transfer tub bench   Therapy/Group: Individual Therapy  Janey Genta 09/13/2022, 7:52 AM

## 2022-09-13 NOTE — Significant Event (Signed)
Hypoglycemic Event  CBG: 56  Treatment: 4 oz juice/soda and 2 tubes glucose gel  Symptoms: None  Follow-up CBG: Time:0804 CBG Result:75  Possible Reasons for Event: Unknown  Comments/MD notified: Nightshift RN notified Silvestre Mesi, PA-C and Kirsteins, MD. New orders received.     [place]

## 2022-09-13 NOTE — Progress Notes (Signed)
Pt declined snack, but drank cup of nectar thick cranberry juice.

## 2022-09-13 NOTE — Progress Notes (Signed)
Patient ID: Melvin Taylor, male   DOB: 05/18/1966, 57 y.o.   MRN: [id number]  [facility] referral sent to [facility].  Patient accepted by [facility].  Orders sent.

## 2022-09-13 NOTE — Progress Notes (Addendum)
Hypoglycemic Event  CBG: 53  Treatment: 8 oz juice/soda, glucose gel x 2, pt now eating breakfast  Symptoms: None  Follow-up CBG: Time:0630 CBG Result:54, 56   Possible Reasons for Event: Unknown  Comments/MD notified: Secure chat Dr Letta Pate and Marlowe Shores PA. Awaiting response. Will report to on coming nurse.    Consuello Masse

## 2022-09-13 NOTE — Progress Notes (Addendum)
Pharmacy consulted to check med list for drug induced causes of hypoglycemia. Crrent med list and home med list checked for causative drugs using Micromedex.   None of patient's current medications or preadmission meds (with exception of Insulin SS preadmission) are associated with significant hypoglycemia with the exception of possibly the patient's metorpolol.   Thank you,   Krysteena Stalker A. Levada Dy, PharmD, BCPS, FNKF Clinical Pharmacist [place] Please utilize Amion for appropriate phone number to reach the unit pharmacist ([place])

## 2022-09-13 NOTE — Progress Notes (Signed)
Checked BGM since pt's blood sugar dropped during the day. BGM is 76. Gave pt cup of chocolate pudding and thickened juice.

## 2022-09-13 NOTE — Progress Notes (Signed)
Physical Therapy Session Note  Patient Details  Name: Melvin Taylor MRN: [id number] Date of Birth: 06/18/1966  Today's Date: 09/13/2022 PT Individual Time: 1101-1206 and [id number] PT Individual Time Calculation (min): 65 min and 62 min  Short Term Goals: Week 2:  PT Short Term Goal 1 (Week 2): = to LTGs based on ELOS  Skilled Therapeutic Interventions/Progress Updates:    Session 1: Pt received supine in bed with his sister, Melvin Taylor, present for follow-up on hands-on education/training from last week in preparation for current planned D/C on 2/15. Pt agreeable to therapy session. Therapist reinforced education with sister that pt is continuing to require a significant amount of time (15-73mn) to initiate and accomplish mobilizing out of the bed at the beginning of each session, decreasing the amount of time he has for other interventions - pt's sister reinforced education and encouragement from therapist to improve pt's participation in this for future sessions. Therapist reinforced education with nursing staff on motivating/encouraging pt to stay OOB as much as possible during the day.   [name] concerned if pt not sleeping well at night - therapist reached out to nursing staff concerning this. [name] reports pt recently diagnosed in January with sleep apnea needing CPAP at night - therapist notified medical team.   Supine>sitting L EOB, HOB partially elevated, with increased time (~5-156mutes) pt able to perform with supervision.  Sit>stand EOB>RW with min assist for lifting to stand and balance due to posterior lean initially - cuing for proper hand placement during transfers (poor recall/carryover during session).   Gait training ~8033fo therapy gym using RW with CGA/min assist and pt's sister providing w/c follow in event of fatigue. [name] concerned about pt's blood sugar so NT present to assess and it was 76. Gait training additional ~47f21f dayroom using RW with  continued CGA/min assist with pt demoing improved ability to maintain his balance while using AD - continues to lack adequate L weight shift frequently progressed to only occasionally kicking R back leg of RW with R LE after cuing for improved AD positioning (pt's sister reports pt ambulated slightly like this prior).   Pt performed bed mobility on mat table to simulate flat bed at home and pt able to perform with CGA/supervision and increased time/effort - pt did pull up using L hand on edge of mat so discussed with his sister the option of installing a bar/handrail if needed on pt's personal bed as pt's preference is to sleep in his current bed rather than using hospital bed.  Gait training additional ~50ft13fng RW with CGA/min assist as described above.  Transported to ortho gym in w/c. Performed ambulatory car transfer using RW with CGA/light min assist and pt requiring max cuing to safely use AD to turn fully and sit as he wanted to sit it off to the side.   Discussed with pt/sister that pt is currently at a limited household ambulator level and would need a wheelchair to access the community and pt/sister in agreement - SW notified of DME recommendation.   [name] reports she wishes pt would have made even more improvement in his mobility but that her current concern is pt's swallowing and blood sugar. Pt's hired caregiver planning to attend education/training tomorrow at 9AM -[place] [employer]. At end of session, pt left seated in w/c in care of nurse.   Session 2: Pt received supine in bed resting with eyes closed but easily awakens. Pt agreeable to therapy session requiring  max encouragement. Pt continues to require ~15-3mnutes to transition from supine>sitting L EOB despite consistent cuing, motivation, and encouragement from therapist to initiate mobility more quickly. Pt lies there yawning with very delayed initiation eventually requiring mod assist to bring trunk upright and LEs  over to EOB for time management, not because pt requires this much assistance. Sitting EOB, donned tennis shoes total assist. Sit>stand EOB>RW again with pt having very delayed initiation of mobility despite therapist providing simple, verbal/visual cuing to come to standing and allowing increased time for processing therefore requiring mod assist to rise to stand and maintain balance due to persistent posterior lean.  Gait training ~2078fto main therapy gym using RW with CGA/min assist for balance and therapist bringing w/c follow in event of fatigue but not used. Pt demonstrating the following gait deviations with therapist providing the described cuing and facilitation for improvement:  - continues to veer R with AD, repeatedly bumping into wall despite cuing to correct - very slow gait speed  - continues to frequently kick R back leg of RW with R LE  - is walking too close inside of AD towards front horizontal bar on RW  - continues to lack adequate L weight shift during L stance to improve R LE swing advancement  - narrow BOS  Sit<>stand NMR training focusing on improving anterior hip hinge to bring weight forward and decrease posterior lean while rising to stand by having pt lean forward and pick up squigz from short step (causing him to flex down as if he is going to sit) then return to standing upright and placing squigz up high on mirror with L hand to promote upright posture and challenge balance - requires consistent min assist for balance throughout with therapist providing facilitation at hips/pelvis to improve anterior pelvic tilt when flexing down and then glute activation when standing with L weight shift as pt continues to have R posterior lean/LOB.  Seated R UE NMR task of reaching to pull squigz off mirror at elbow height - this requires significantly increased time and effort with pt able to remove 3.  Gait training ~20024fack to pt's room using RW with CGA/light min assist and pt  continuing to demo above gait deviations with pt noticed to have more narrow BOS with his heels bumping each other during swing (narrow BOS with slight excessive hip external rotation R>L) and requires 3x standing rest breaks to make the distance (therapist did not bring w/c).   Pt reports need to use bathroom. Gait training into bathroom using RW with CGA/light min assist and requiring max cuing to keep AD with him and use it correctly to maintain his balance and increase independence. Dependent LB clothing management. Pt left seated on BSC over toilet with NT present to assume care of pt and educated that pt needs to sit up in recliner for next meal.  Therapy Documentation Precautions:  Precautions Precautions: Fall, Other (comment) Precaution Comments: R hemiplegia with R UE flexor tone and R LE extensor tone (LE tone primarily in the bed), Aphasia, delayed processing Required Braces or Orthoses: Other Brace Other Brace: R UE aircast for tone management Restrictions Weight Bearing Restrictions: No   Pain:  Session 1: Pt moving R arm quickly and causing muscle spasm with pain but only briefly.  Session 2: Continues to have R hemibody muscle spasm type pain when moving those limbs too quickly.    Therapy/Group: Individual Therapy  CarTawana ScalePT, DPT, NCS, CSRS 09/13/2022,  8:02 AM

## 2022-09-14 ENCOUNTER — Inpatient Hospital Stay (HOSPITAL_COMMUNITY): Payer: Medicaid Other

## 2022-09-14 ENCOUNTER — Ambulatory Visit: Payer: Medicaid Other | Admitting: Gastroenterology

## 2022-09-14 LAB — GLUCOSE, CAPILLARY
Glucose-Capillary: 56 mg/dL — ABNORMAL LOW (ref 70–99)
Glucose-Capillary: 59 mg/dL — ABNORMAL LOW (ref 70–99)
Glucose-Capillary: 93 mg/dL (ref 70–99)
Glucose-Capillary: 84 mg/dL (ref 70–99)
Glucose-Capillary: 133 mg/dL — ABNORMAL HIGH (ref 70–99)

## 2022-09-14 LAB — BASIC METABOLIC PANEL
Anion gap: 8 (ref 5–15)
BUN: 37 mg/dL — ABNORMAL HIGH (ref 6–20)
CO2: 24 mmol/L (ref 22–32)
Calcium: 8.7 mg/dL — ABNORMAL LOW (ref 8.9–10.3)
Chloride: 101 mmol/L (ref 98–111)
Creatinine, Ser: 1.88 mg/dL — ABNORMAL HIGH (ref 0.61–1.24)
GFR, Estimated: 41 mL/min — ABNORMAL LOW (ref >60)
Glucose, Bld: 106 mg/dL — ABNORMAL HIGH (ref 70–99)
Potassium: 4.8 mmol/L (ref 3.5–5.1)
Sodium: 133 mmol/L — ABNORMAL LOW (ref 135–145)

## 2022-09-14 MED ORDER — LAMOTRIGINE 25 MG PO TABS: 50.0000 mg | ORAL_TABLET | Freq: Two times a day (BID) | ORAL | 0 refills | Status: DC

## 2022-09-14 MED ORDER — ACETAMINOPHEN 160 MG/5ML PO SOLN: 650.0000 mg | ORAL | 0 refills | Status: DC | PRN

## 2022-09-14 MED ORDER — ALBUTEROL SULFATE HFA 108 (90 BASE) MCG/ACT IN AERS: 2.0000 | INHALATION_SPRAY | RESPIRATORY_TRACT | 0 refills | Status: DC | PRN

## 2022-09-14 MED ORDER — TICAGRELOR 90 MG PO TABS: 90.0000 mg | ORAL_TABLET | Freq: Two times a day (BID) | ORAL | 5 refills | Status: DC

## 2022-09-14 MED ORDER — METOPROLOL TARTRATE 25 MG PO TABS: 12.5000 mg | ORAL_TABLET | Freq: Two times a day (BID) | ORAL | 0 refills | Status: DC

## 2022-09-14 MED ORDER — ESCITALOPRAM OXALATE 20 MG PO TABS: 20.0000 mg | ORAL_TABLET | Freq: Every day | ORAL | 0 refills | Status: DC

## 2022-09-14 MED ORDER — LIDOCAINE HCL 1 % IJ SOLN
5.0000 mL | Freq: Once | INTRAMUSCULAR | Status: DC
  Filled 2022-09-14: qty 5

## 2022-09-14 MED ORDER — BETAMETHASONE SOD PHOS & ACET 6 (3-3) MG/ML IJ SUSP
12.0000 mg | Freq: Once | INTRAMUSCULAR | Status: AC
  Administered 2022-09-15: 12 mg via INTRAMUSCULAR
  Filled 2022-09-14 (×2): qty 2

## 2022-09-14 MED ORDER — ADULT MULTIVITAMIN W/MINERALS CH: 1.0000 | ORAL_TABLET | Freq: Every day | ORAL | Status: AC

## 2022-09-14 MED ORDER — VITAMIN D (ERGOCALCIFEROL) 1.25 MG (50000 UNIT) PO CAPS: 50000.0000 [IU] | ORAL_CAPSULE | ORAL | 0 refills | Status: DC

## 2022-09-14 MED ORDER — VALPROIC ACID 250 MG/5ML PO SOLN: 500.0000 mg | Freq: Two times a day (BID) | ORAL | 0 refills | Status: DC

## 2022-09-14 MED ORDER — ATORVASTATIN CALCIUM 80 MG PO TABS: 80.0000 mg | ORAL_TABLET | Freq: Every day | ORAL | 0 refills | Status: DC

## 2022-09-14 MED ORDER — LIDOCAINE HCL (PF) 1 % IJ SOLN
5.0000 mL | Freq: Once | INTRAMUSCULAR | Status: AC
  Administered 2022-09-15: 5 mL via INTRADERMAL
  Filled 2022-09-14: qty 30

## 2022-09-14 MED ORDER — CYCLOSPORINE 0.05 % OP EMUL: 1.0000 [drp] | Freq: Two times a day (BID) | OPHTHALMIC | 0 refills | Status: DC

## 2022-09-14 NOTE — Progress Notes (Signed)
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 Recent Labs    09/12/22 0924  WBC 3.4*  HGB 8.5*  HCT 25.9*  PLT 200    Recent Labs    09/12/22 0924  NA 133*  K 5.1  CL 102  CO2 27  GLUCOSE 89  BUN 30*  CREATININE 1.63*  CALCIUM 8.6*     Intake/Output Summary (Last 24 hours) at 09/14/2022 0945 Last data filed at 09/14/2022 [id number] Gross per 24 hour  Intake 913.42 ml  Output 150 ml  Net 763.42 ml         Physical Exam: Vital Signs Blood pressure 123/78, pulse 60, temperature 98.3 F (36.8 C), temperature source Oral, resp. rate 16, height 5' 5"$  (1.651 m), weight 53.2 kg, SpO2 96 %.   General: No acute distress. EOB dressing with OT  Heart: Regular rate and rhythm no rubs murmurs or extra sounds  Lungs: Clear to auscultation, breathing unlabored, no rales or wheezes Abdomen: Positive bowel sounds, soft nontender to palpation, nondistended Extremities: No clubbing, cyanosis, or edema Skin: No evidence of breakdown, no evidence of rash Neurologic: Moving all 4 extremities in bed, antigravity and against resistance. Oriented to self and year with cues. Delayed responses.  Can follow simple commands. CN 2-12 grossly intact Psych: Mildly impulsive, flat affect   Prior exams: Cranial nerves II through XII intact, motor strength is 4/5 in left 3/5 Right , bicep, tricep, grip, hip flexor, knee extensors, ankle dorsiflexor and plantar flexor, left delt RIght deltoid MMT but now able to elevate and abd to 90 deg (improved)    Musculoskeletal:RIght shoulder pain limits abd and flexion , also limited ROM with ER, pain to palp Right wrist no swelling erythema or deformity (? Hyperalgesia  CVA related) no  elow  deformity , mild tenderness R acromian No L knee tenderness at joint line medial and lateral, no pain with varus or valgus stress, no swelling  Left ankle pain with dorsiflexion, no deformity , no joint swelling or erythema   Assessment/Plan: 1. Functional deficits which require 3+ hours per day of interdisciplinary therapy in a comprehensive inpatient rehab setting. Physiatrist is providing close team supervision and 24 hour management of active medical problems listed below. Physiatrist and rehab team continue to assess barriers to discharge/monitor patient progress toward functional and medical goals  Care Tool:  Bathing    Body parts bathed by patient: Chest, Abdomen, Right upper leg, Left upper leg, Face   Body parts bathed by helper: Front perineal area, Buttocks, Left lower leg, Right lower leg, Left arm, Right arm     Bathing assist Assist Level: Maximal Assistance - Patient 24 - 49%     Upper Body Dressing/Undressing Upper body dressing   What is the patient wearing?: Pull over shirt    Upper body assist Assist Level: Maximal Assistance - Patient 25 - 49%    Lower Body Dressing/Undressing Lower body dressing      What is  the patient wearing?: Underwear/pull up, Pants     Lower body assist Assist for lower body dressing: Maximal Assistance - Patient 25 - 49%     Toileting Toileting    Toileting assist Assist for toileting: Dependent - Patient 0%     Transfers Chair/bed transfer  Transfers assist     Chair/bed transfer assist level: Minimal Assistance - Patient > 75% Chair/bed transfer assistive device: Armrests, Programmer, multimedia   Ambulation assist      Assist level: Minimal Assistance - Patient > 75% Assistive device: Walker-rolling Max distance: 11f   Walk 10 feet activity   Assist     Assist level: 2 helpers Assistive device: Hand held assist   Walk 50 feet activity   Assist Walk 50 feet with 2 turns activity  did not occur: Safety/medical concerns         Walk 150 feet activity   Assist Walk 150 feet activity did not occur: Safety/medical concerns         Walk 10 feet on uneven surface  activity   Assist Walk 10 feet on uneven surfaces activity did not occur: Safety/medical concerns         Wheelchair     Assist Is the patient using a wheelchair?: Yes (for transport) Type of Wheelchair: Manual    Wheelchair assist level: Dependent - Patient 0%      Wheelchair 50 feet with 2 turns activity    Assist        Assist Level: Dependent - Patient 0%   Wheelchair 150 feet activity     Assist      Assist Level: Dependent - Patient 0%   Blood pressure 123/78, pulse 60, temperature 98.3 F (36.8 C), temperature source Oral, resp. rate 16, height 5' 5"$  (1.651 m), weight 53.2 kg, SpO2 96 %.  Medical Problem List and Plan: 1. Functional deficits secondary to acute metabolic encephalopathy.  Stable trace subdural hematoma along the posterior falx and posterior occipital lobe.  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  Received inpatient rehab services 12/21/2020 - 12/30/2020 after CVA             -patient may shower             -ELOS/Goals: 09/15/2022 Min A level              -Continue CIR,  PT OT and SLP -walked 20 ft with RW  MOD A with PT    2.  Antithrombotics: -DVT/anticoagulation:  Mechanical:  Antiembolism stockings, knee (TED hose) Bilateral lower extremities             -antiplatelet therapy: Brilinta 90 mg twice daily 3. 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 Mood/Behavior/Sleep: more leathargic since admit, will d/c   Valium( not a home med), D/C Keppra, restart  lamictal after review of home meds with pt's sister (POA) and neuro  Trial modafanil during inpt stay will d/c when he goes home -seen by neuropsychology 09/10/22- limited eval due to cognition 5. Neuropsych/cognition: This patient is not capable of making decisions on his own behalf. 6. Skin/Wound Care: Routine skin checks 7. Fluids/Electrolytes/Nutrition: Routine in and outs with follow-up chemistries  -2/4: See BMP results above; Na 133->134, K 5.7->5.5, and Cr 1.64->1.53 with 1 L IVF yesterday. Continue today, due to hypoglycemia switching to D5 1/2NS 1L overnight. Encouraged PO fluids. AM labs.  - Calorie counts, dietary consult placed to see if  diet can be further optimized to reduce hypoglycemic events 2/5 will order lokelma for K+ 5.8, give 10g lokelma  2/6 K+ continues to be elevated, increase IVF to121m/hr, change ensure to nepro, discussed with pharmacy-no meds likely contributing, lokelma TID for 3 doses planned -Addendum K+ down to 4.6, will hold off on further lokelma, recheck tomorrow    Latest Ref Rng & Units 09/12/2022    9:24 AM 09/09/2022    5:29 AM 09/07/2022    7:18 AM  BMP  Glucose 70 - 99 mg/dL 89  60  115   BUN 6 - 20 mg/dL 30  15  17   $ Creatinine 0.61 - 1.24 mg/dL 1.63  1.11  1.12   Sodium 135 - 145 mmol/L 133  136  135   Potassium 3.5 - 5.1 mmol/L 5.1  4.4  4.7   Chloride 98 - 111 mmol/L 102  104  107   CO2 22 - 32 mmol/L 27  23  20   $ Calcium 8.9 - 10.3 mg/dL 8.6  8.5  8.1      8.  Seizure disorder/epilepsy.  EEG negative.  Keppra 1000 mg every 12 hours, Depakote 500 mg twice a day  9.  Diabetes mellitus.  Currently SSI.  per  sister not on Lantus at home  CBG (last 3)  Recent Labs    09/14/22 0630 09/14/22 0650 09/14/22 0730  GLUCAP 56* 59* 93   No on hypoglycemic agents, enc po intake  2/10 continue to encourage PO intake for occasional hypoglycemic episodes , DC SSI-not requiring 2/11 no further hypoglycemic events noted 10.  Hyperlipidemia.  Lipitor 860mdaily 11.  CAD with CABG 2017.  Continue Imdur 30 mg daily.  No chest pain or shortness of breath. 12.  History of hypertension.  Patient on Norvasc 10 mg daily, Lopressor 12.5 mg twice daily, lisinopril 5 mg daily, -2/12 well controlled Vitals:   09/13/22 1945 09/14/22 0400  BP: (!) 101/51 123/78  Pulse: 60 60  Resp: 18 16  Temp: 98.2 F (36.8 C) 98.3 F (36.8 C)  SpO2: 100% 96%      13.  COVID-19.  Resolved.  Precautions have been discontinued 14. Hyponatremic, Na 129  -2/5, was given D5 1/2 NS last night, will start D5NS 5057mr  -2/6 Na improved to 135 today  Recheck tomorrow 15. AKI  -2/5 Start IVF  2/6 improving, increase IVF to 100m4m      Latest Ref Rng & Units 09/12/2022    9:24 AM 09/09/2022    5:29 AM 09/07/2022    7:18 AM  BMP  Glucose 70 - 99 mg/dL 89  60  115   BUN 6 - 20 mg/dL 30  15  17   $ Creatinine 0.61 - 1.24 mg/dL 1.63  1.11  1.12   Sodium 135 - 145 mmol/L 133  136  135  Potassium 3.5 - 5.1 mmol/L 5.1  4.4  4.7   Chloride 98 - 111 mmol/L 102  104  107   CO2 22 - 32 mmol/L 27  23  20   $ Calcium 8.9 - 10.3 mg/dL 8.6  8.5  8.1    CKD f/u nephro as an outpt 2/11 Recheck BMET tomorrow  16. Anemia acute on chronic  Hgb mildly lower due to IVF  LOS: 14 days A FACE TO Lantana E Mikaelah Trostle 09/14/2022, 9:45 AM

## 2022-09-14 NOTE — Plan of Care (Signed)
  Problem: RH BOWEL ELIMINATION Goal: RH STG MANAGE BOWEL W/MEDICATION W/ASSISTANCE Description: STG Manage Bowel with Medication with min Assistance. Outcome: Not Progressing; incontinence   Problem: RH KNOWLEDGE DEFICIT Goal: RH STG INCREASE KNOWLEDGE OF DYSPHAGIA/FLUID INTAKE Description: Patient/caregiver will be able to managed appropriate diet modifications/textures from nursing handout and nursing education independently  Outcome: Not Progressing; confusion ; encephalopathy

## 2022-09-14 NOTE — Patient Care Conference (Signed)
Inpatient RehabilitationTeam Conference and Plan of Care Update Date: 09/14/2022   Time: 10:36 AM    Patient Name: Melvin Taylor      Medical Record Number: [id number]  Date of Birth: 06/20/1966 Sex: Male         Room/Bed: 4W02C/4W02C-01 Payor Info: Payor: MEDICAID [place] / Plan: MEDICAID [place] ACCESS / Product Type: *No Product type* /    Admit Date/Time:  08/31/2022  4:47 PM  Primary Diagnosis:  Acute metabolic encephalopathy  Hospital Problems: Principal Problem:   Acute metabolic encephalopathy Active Problems:   Primary hypertension   Hyperlipidemia    Expected Discharge Date: Expected Discharge Date: 09/15/22  Team Members Present: Physician leading conference: Dr. Alysia Penna Social Worker Present: Erlene Quan, BSW Nurse Present: Dorien Chihuahua, RN PT Present: Page Spiro, PT OT Present: Other (comment) Lowella Fairy, OT) SLP Present: Weston Anna, SLP PPS Coordinator present : Gunnar Fusi, SLP     Current Status/Progress Goal Weekly Team Focus  Bowel/Bladder   continent of bladder and bowel   maintain continency   assess bladder and bowel q shift and prn    Swallow/Nutrition/ Hydration   dysphagia 2 diet with nectar thick liquids   sup A  MBS, pharyngeal stregthening exercises, tolerance of current diet, education    ADL's   mod A UB dressing, min A UB bathing, mod LB dressing, mod A LB bathing, min A shower transfer, min A toilet transfer, mod A toileting   min A overall   R-NMR, fucntional mobility training, family education, BADL retraining, d/c planning, balance trianing, activity tolerance, U/LB strengthening, pain management    Mobility   supervision bed mobility (when given increased time and pt having full engagement in task), min assist sit<>stand and stand pivot transfers uisng RW, min assist gait up to ~264f using RW, min assist car transfers using RW - requires wheelchair to access community - pt demos minor improvement in his  engagement and participation   downgraded to min assist overall  pt participation, activity tolerance, dynamic standing balance, bed mobility training, transfer training, gait training, pt/family education, DME training    Communication   pt at baseline per family - goals discontinued   NA        Safety/Cognition/ Behavioral Observations  pt at baseline per family - goal discontinued   NA        Pain   patient is pain free   less than 2/10 pain scale   assess q shift and prn for pain    Skin   intact   maintain skin integrity  assess skin q shift and prn      Discharge Planning:  D/c home on Thurs with sisters and family to assist   Team Discussion: Patient with inconsistent pain issues, hyper-reactive to stimuli and sensitivity noted.  Patient on target to meet rehab goals: Currently needs mod assist for upper body and lower body dressing with min assist for upper body bathing. Able to ambulate once up with HHA; min assist and ambulate with a RW with min assist. Able to complete car transfers with min assist. Maintained on a D2; Nectar thick diet. Communication and cognition near baseline.  *See Care Plan and progress notes for long and short-term goals.   Revisions to Treatment Plan:  Downgraded PT goals to min assist   Teaching Needs: Safety, transfers, toileting, medications, dietary modifications, etc.   Current Barriers to Discharge: Decreased caregiver support and Behavior  Possible Resolutions to Barriers: Family education [facility]  follow up services DME: W/C Home Equipment: Conservation officer, nature (2 wheels), [employer] - single point, Shower seat, Hand held shower head, Bedside Commode     Medical Summary Current Status: Inconsistent pain c/os, asymptomatic hypoglycemia  Barriers to Discharge: Incontinence;Inadequate Nutritional Intake;Other (comments)  Barriers to Discharge Comments: hypoglycemia, mild asymptomatic, chronic R shoulder pain frozen shoulder Possible  Resolutions to Barriers/Weekly Focus: enc po intake, f/u endo, d/c in am after family traing   Continued Need for Acute Rehabilitation Level of Care: The patient requires daily medical management by a physician with specialized training in physical medicine and rehabilitation for the following reasons: Direction of a multidisciplinary physical rehabilitation program to maximize functional independence : Yes Medical management of patient stability for increased activity during participation in an intensive rehabilitation regime.: Yes Analysis of laboratory values and/or radiology reports with any subsequent need for medication adjustment and/or medical intervention. : Yes   I attest that I was present, lead the team conference, and concur with the assessment and plan of the team.   Dorien Chihuahua B 09/14/2022, 4:35 PM

## 2022-09-14 NOTE — Plan of Care (Signed)
  Problem: Sit to Stand Goal: LTG:  Patient will perform sit to stand in prep for activites of daily living with assistance level (OT) Description: LTG:  Patient will perform sit to stand in prep for activites of daily living with assistance level (OT) Outcome: Not Met (add Reason) Note: Pt continues to require min A for sit>stand d/t hemiplegia 2/2 to CVA.   Problem: RH Bathing Goal: LTG Patient will bathe all body parts with assist levels (OT) Description: LTG: Patient will bathe all body parts with assist levels (OT) Outcome: Not Met (add Reason) Note: Pt continues to require mod A for LB bathing and min A UB bathing d/t functional and cognitive deficits 2/2 CVA.    Problem: RH Dressing Goal: LTG Patient will perform lower body dressing w/assist (OT) Description: LTG: Patient will perform lower body dressing with assist, with/without cues in positioning using equipment (OT) Outcome: Not Met (add Reason) Note: Pt continues to require increased assistance d/t balance, strength, and cognitive deficits 2/2 to CVA   Problem: RH Toileting Goal: LTG Patient will perform toileting task (3/3 steps) with assistance level (OT) Description: LTG: Patient will perform toileting task (3/3 steps) with assistance level (OT)  Outcome: Not Met (add Reason) Note: Pt continues to require mod A for toileting d/t functional and cognitive deficits 2/2 CVA.    Problem: RH Functional Use of Upper Extremity Goal: LTG Patient will use RT/LT upper extremity as a (OT) Description: LTG: Patient will use right/left upper extremity as a stabilizer/gross assist/diminished/nondominant/dominant level with assist, with/without cues during functional activity (OT) Outcome: Not Met (add Reason) Note: Pt able to utilize RUE as a gross assist, however unable to use at dominant d/t spasticity and decreased strength/ROM 2/2 CVA   Problem: RH Balance Goal: LTG: Patient will maintain dynamic sitting balance (OT) Description:  LTG:  Patient will maintain dynamic sitting balance with assistance during activities of daily living (OT) Outcome: Completed/Met Goal: LTG Patient will maintain dynamic standing with ADLs (OT) Description: LTG:  Patient will maintain dynamic standing balance with assist during activities of daily living (OT)  Outcome: Completed/Met   Problem: RH Eating Goal: LTG Patient will perform eating w/assist, cues/equip (OT) Description: LTG: Patient will perform eating with assist, with/without cues using equipment (OT) Outcome: Completed/Met   Problem: RH Grooming Goal: LTG Patient will perform grooming w/assist,cues/equip (OT) Description: LTG: Patient will perform grooming with assist, with/without cues using equipment (OT) Outcome: Completed/Met   Problem: RH Dressing Goal: LTG Patient will perform upper body dressing (OT) Description: LTG Patient will perform upper body dressing with assist, with/without cues (OT). Outcome: Completed/Met   Problem: RH Toilet Transfers Goal: LTG Patient will perform toilet transfers w/assist (OT) Description: LTG: Patient will perform toilet transfers with assist, with/without cues using equipment (OT) Outcome: Completed/Met   Problem: RH Tub/Shower Transfers Goal: LTG Patient will perform tub/shower transfers w/assist (OT) Description: LTG: Patient will perform tub/shower transfers with assist, with/without cues using equipment (OT) Outcome: Completed/Met   Problem: RH Attention Goal: LTG Patient will demonstrate this level of attention during functional activites (OT) Description: LTG:  Patient will demonstrate this level of attention during functional activites  (OT) Outcome: Completed/Met

## 2022-09-14 NOTE — Progress Notes (Addendum)
Patient ID: Melvin Taylor, male   DOB: [birth year], 57 y.o.   MRN: [id number]  Patient approved for PT OT SLP with [facility].  Orders faxed.

## 2022-09-14 NOTE — Progress Notes (Signed)
Patient ID: [name], male   DOB: 03-28-1966, 57 y.o.   MRN: [id number]  2:37PM: Sw received FU from patient sister, [name]. Sister has spoken with Dr. Letta Pate and she is aware fo pt's d/c tomorrow. Sister will be present this evening. No additional questions or concerns .

## 2022-09-14 NOTE — Progress Notes (Signed)
Occupational Therapy Discharge Summary  Patient Details  Name: Melvin Taylor MRN: [id number] Date of Birth: [birth year]  Date of Discharge from OT service:September 14, 2022  Today's Date: 09/14/2022 OT Individual Time: [id number] OT Individual Time Calculation (min): 45 min    Patient has met 8 of 13 long term goals due to improved activity tolerance, improved balance, postural control, ability to compensate for deficits, functional use of  RIGHT upper and RIGHT lower extremity, improved attention, improved awareness, and improved coordination.  Patient to discharge at overall Min-Mod Assist level.  Patient's care partner is independent to provide the necessary physical and cognitive assistance at discharge.    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 Pt required maximal motivation to participate in each session and increased time to initiate participation. Pt limited by pain and increased pain sensitivity in RU/LE, however Pt also reporting intermittent pain in LLE limiting participation in session. Pt continues to require increased assistance with toileting, bathing, and dressing, however made significant improvement from baseline. Pt caregiver and family are available to provide the necessary support and have participated in family session prior to Pt d/c to increase Pt and caregiver safety.   Recommendation:  Patient will benefit from ongoing skilled OT services in home health setting to continue to advance functional skills in the area of BADL and Reduce care partner burden.  Equipment: Tub Producer, television/film/video  Reasons for discharge: treatment goals met and discharge from hospital  Patient/family agrees with progress made and goals achieved: Yes  OT Discharge Skilled Therapeutic Interventions/Progress Updates:  Pt received supine in bed presenting to be soiled in urine upon OT arrival. Agreeable to OT session reporting pain with  movement in RU/LE with rest breaks and repositioning provided. Pt requiring increased time and motivation to participate in session. Transitioned to EOB with supervision ++time. Pt doffed shirt min A and pants mod A in standing. Completed UB bathing with min A to wash LUE with assistance also provided to wash his back. LB bathing completed mod A to maintain balance for washing bottom with assistance required to wash R/L LES and bottom. Shirt donned with min A ++time and max cueing for modified technique. Pants donned mod A to weave RLE and maintain balance while bringing pants to wast. Functional mobility to wc min A HHA. Pt was left resting in wc with call bell in reach, chair alarm on, and all needs met.   Precautions/Restrictions  Precautions Precautions: Fall;Other (comment) Precaution Comments: R hemiplegia with R UE flexor tone and R LE extensor tone (LE tone primarily in the bed), Aphasia, delayed processing Other Brace: R UE aircast for tone management Restrictions Weight Bearing Restrictions: No General     Pain: Unrated pain in RU/LE with rest breaks and repositioning provided.    ADL ADL Eating: Minimal assistance Where Assessed-Eating: Chair Grooming: Minimal assistance Where Assessed-Grooming: Edge of bed Upper Body Bathing: Minimal assistance Where Assessed-Upper Body Bathing: Edge of bed Lower Body Bathing: Moderate assistance Where Assessed-Lower Body Bathing: Edge of bed Upper Body Dressing: Minimal assistance, Moderate assistance, Maximal cueing (able to put on loose t-shirts min A +time, mod A for sweat shirts) Where Assessed-Upper Body Dressing: Edge of bed Lower Body Dressing: Moderate assistance, Maximal cueing Where Assessed-Lower Body Dressing: Edge of bed Toileting: Moderate assistance Where Assessed-Toileting: Glass blower/designer: Psychiatric nurse Method: Counselling psychologist: Ambulance person Transfer: Librarian, academic Method: Optometrist:  Transfer tub bench, Grab bars Social research officer, government: Minimal assistance Social research officer, government Method: Heritage manager: Gaffer Baseline Vision/History: 0 No visual deficits (Pt not participating in formal assess. No visual deficits noted during fucntional tasks and BADLs) Patient Visual Report: No change from baseline Vision Assessment?: No apparent visual deficits Perception  Perception: Within Functional Limits Praxis Praxis: Impaired Praxis Impairment Details: Initiation Praxis-Other Comments: significantly delay initiation Cognition Cognition Overall Cognitive Status: History of cognitive impairments - at baseline Arousal/Alertness: Awake/alert Orientation Level: Person Memory: Impaired Memory Impairment: Storage deficit;Retrieval deficit;Decreased recall of new information;Decreased short term memory;Decreased long term memory Decreased Long Term Memory: Functional basic Decreased Short Term Memory: Functional basic Attention: Selective Focused Attention: Appears intact Sustained Attention: Appears intact Selective Attention: Impaired Selective Attention Impairment: Functional basic Awareness Impairment: Intellectual impairment Problem Solving: Impaired Problem Solving Impairment: Functional basic Executive Function:  (all impaired d/t lower level cognitive impairement present) Behaviors: Poor frustration tolerance Safety/Judgment: Impaired Brief Interview for Mental Status (BIMS) Repetition of Three Words (First Attempt): 3 Temporal Orientation: Year: Missed by 2 to 5 years Temporal Orientation: Month: Missed by more than 1 month Temporal Orientation: Day: Incorrect Recall: "Sock": No, could not recall Recall: "Blue": No, could not recall Recall: "Bed": No, could not recall BIMS Summary Score: 4 Sensation Sensation Light Touch: Impaired by gross  assessment Hot/Cold: Not tested Proprioception: Not tested Stereognosis: Not tested Coordination Gross Motor Movements are Fluid and Coordinated: Yes Fine Motor Movements are Fluid and Coordinated: Yes Coordination and Movement Description: impaired due to R hemiplegia with abnormal tone and imbalance balance Finger Nose Finger Test: Pt unable to follow directions for assessment Motor  Motor Motor: Abnormal tone;Abnormal postural alignment and control;Hemiplegia Motor - Skilled Clinical Observations: R hemiplegia, R UE flexor tone, R LE extensor tone primarily in supine Mobility  Bed Mobility Bed Mobility: Rolling Right;Rolling Left;Supine to Sit;Sit to Supine Rolling Right: Supervision/verbal cueing Rolling Left: Minimal Assistance - Patient > 75% Supine to Sit: Supervision/Verbal cueing Sit to Supine: Supervision/Verbal cueing Transfers Sit to Stand: Minimal Assistance - Patient > 75% Stand to Sit: Minimal Assistance - Patient > 75%  Trunk/Postural Assessment  Cervical Assessment Cervical Assessment: Exceptions to [facility] (forward head) Thoracic Assessment Thoracic Assessment: Exceptions to [facility] (rounded shoudlers) Lumbar Assessment Lumbar Assessment: Exceptions to [facility] (posterior pelvic tilt) Postural Control Postural Control: Deficits on evaluation Righting Reactions: delayed and inadequate for minor or more perturbations Postural Limitations: decreased  Balance Balance Balance Assessed: Yes Static Sitting Balance Static Sitting - Balance Support: Feet unsupported Static Sitting - Level of Assistance: 5: Stand by assistance Dynamic Sitting Balance Dynamic Sitting - Balance Support: Feet unsupported Dynamic Sitting - Level of Assistance: 5: Stand by assistance Static Standing Balance Static Standing - Balance Support: Bilateral upper extremity supported Static Standing - Level of Assistance: 4: Min assist Dynamic Standing Balance Dynamic Standing - Balance Support:  Bilateral upper extremity supported Dynamic Standing - Level of Assistance: 4: Min assist Extremity/Trunk Assessment RUE Assessment RUE Assessment: Exceptions to [facility] Passive Range of Motion (PROM) Comments: Limited d/t pain Active Range of Motion (AROM) Comments: Limited d/t pain, able to flex shoulder to 90 degrees when giving maximal effort following gentle PROM General Strength Comments: 2+/5 distally and proximally LUE Assessment LUE Assessment: Exceptions to [facility] General Strength Comments: 4/5 d/t general deconditioning   Janey Genta 09/14/2022, 12:57 PM

## 2022-09-14 NOTE — Discharge Summary (Signed)
Physical Therapy Discharge Summary  Patient Details  Name: Melvin Taylor MRN: [id number] Date of Birth: 30-Jan-1966  Date of Discharge from PT service:September 14, 2022  Today's Date: 09/14/2022 PT Individual Time: [id number] PT Individual Time Calculation (min): 75 min    Patient has met 8 of 8 long term goals due to improved activity tolerance, improved balance, improved postural control, increased strength, ability to compensate for deficits, functional use of  right upper extremity and right lower extremity, improved attention, and improved awareness.  Patient to discharge at an ambulatory level [place] using RW for household mobility, requires wheelchair for participation in mobility outside the home. Patient has a hired caregiver and family who are independent to provide the necessary physical and cognitive assistance at discharge.  All goals met.  Recommendation:  Patient will benefit from ongoing skilled PT services in home health setting to continue to advance safe functional mobility, address ongoing impairments in bed mobility training, transfer training, gait training using LRAD, standing balance, R hemibody NMR, and minimize fall risk.  Equipment: 16x18wheelchair with cushion, pt already has RW  Reasons for discharge: treatment goals met and discharge from hospital  Patient/family agrees with progress made and goals achieved: Yes  Skilled Therapeutic Interventions/Progress Updates:  Pt received supine in bed awake and upon encouraging pt to participate in therapy session he stated "no." Therapist educated pt on importance of participation; however, pt minimally receptive to this education therefore therapist called pt's sister, Melvin Taylor, and she encouraged him to participate and at that point he was agreeable. Despite this, pt still required >16mnutes to initiate and complete mobility transition to sitting EOB. Supine>sitting L EOB, HOB partially elevated, with supervision.  Scoot towards EOB with pt demoing improved effort but continues to require min assist. Donned tennis shoes total assist. Sit>stand EOB>RW with min assist for lifting to stand, facilitating increased anterior trunk flexion when initiating transfer, and for balance while rising due to posterior lean - pt receptive to cuing to push up with L hand from EOB this session with improvement in rising but still requiring min assist. Gait training ~2037fto main therapy gym using RW with CGA for steadying and intermittent light min assist for balance (therapist bringing w/c in event needed but not used) - pt continues to intermittently kick R back leg of RW with R LE, continues to veer towards R but this is primarily when fatigued due to pt not pushing as much with R UE, narrow BOS, and significantly decreased gait speed. Pt demos poor eccentric control when going to sit in standard chair in gym due to lack of forward/anterior hip hinge with pt extending trunk back causing minor posterior LOB while sitting. L stand pivot chair>w/c with pt reluctant to use RW wanting to hold onto w/c armrest with min assist. Stair navigation training ascending/descending 4 steps (6" height) using L UE support on each HR with min  assist for balance and R LE management - step-to pattern leading with varying L vs R LE on ascent (but when leading with R LE demos poor ability to power up through LE) and then leading with R LE on descent but pt requires manual facilitation to place his foot onto the next step during descent. Transported back to his room and pt requesting to return to bed. Sit>stand w/c>RW with light min assist - pt receptive to cuing to push up from armrest and bring trunk forward. Gait training ~1549fo EOB using RW with light min assist and requiring  max cuing to keep AD with him as he turns to sit because pt wants to park it off to the side. Sit>supine with supervision and min assist to reposition for improved pt comfort. Pt left  supine in bed with needs in reach and bed alarm on.  PT Discharge Precautions/Restrictions Precautions Precautions: Fall;Other (comment) Precaution Comments: R hemiplegia with R UE flexor tone and R LE extensor tone (LE tone primarily in the bed), Aphasia, delayed processing Other Brace: R UE aircast for tone management Restrictions Weight Bearing Restrictions: No Pain Pain Assessment Pain Scale: 0-10 Pain Score: 5  Pain Location: Arm (reports pain in "both arms") Pain Orientation: Right;Left Pain Descriptors / Indicators: Aching Patients Stated Pain Goal: 1 Pain Intervention(s): RN made aware;Distraction;Emotional support;Relaxation Pain Interference Pain Interference Pain Effect on Sleep: 4. Almost constantly Pain Interference with Therapy Activities: 2. Occasionally Pain Interference with Day-to-Day Activities: 2. Occasionally Vision/Perception  Vision - History Ability to See in Adequate Light: 0 Adequate Vision - Assessment Additional Comments: vision is difficult to formally assess due to decreased participation overall Perception Perception: Within Functional Limits Praxis Praxis: Impaired Praxis Impairment Details: Initiation;Motor planning Praxis-Other Comments: significantly delay initiation  Cognition Overall Cognitive Status: History of cognitive impairments - at baseline Arousal/Alertness: Awake/alert Orientation Level: Oriented to person Attention: Selective;Focused;Sustained Focused Attention: Appears intact Sustained Attention: Impaired Selective Attention: Impaired Memory: Impaired Awareness: Impaired Behaviors: Poor frustration tolerance Safety/Judgment: Impaired Sensation Sensation Light Touch: Impaired Detail Central sensation comments: pt able to feel touch on screen but reports R LE sensation feels different compared to L LE Peripheral sensation comments: per chart, hx of polyneuropathy Light Touch Impaired Details: Impaired RLE;Impaired  LLE Hot/Cold: Not tested Proprioception: Impaired by gross assessment (appears to be impaired in R LE during mobility tasks) Stereognosis: Not tested Coordination Gross Motor Movements are Fluid and Coordinated: No Coordination and Movement Description: impaired due to R hemiplegia with hypertonia and impaired balance Motor  Motor Motor: Abnormal tone;Abnormal postural alignment and control;Hemiplegia Motor - Discharge Observations: R hemiplegia, R UE flexor tone, and R LE extensor tone primarily in supine  Mobility Bed Mobility Bed Mobility: Supine to Sit;Sit to Supine Rolling Right: Supervision/verbal cueing Rolling Left: Minimal Assistance - Patient > 75% Supine to Sit: Supervision/Verbal cueing (when pt engaged/fully participating in the task) Sit to Supine: Supervision/Verbal cueing (when pt engaged/fully participating in the task) Transfers Transfers: Sit to Stand;Stand to [employer] Transfers (when pt engaged and fully participating in the task otherwise can intermittently require mod assist for coming to stand and maintaining balance due to posterior lean) Sit to Stand: Minimal Assistance - Patient > 75% Stand to Sit: Minimal Assistance - Patient > 75% Stand Pivot Transfers: Minimal Assistance - Patient > 75% Stand Pivot Transfer Details: Verbal cues for technique;Verbal cues for sequencing;Verbal cues for gait pattern;Verbal cues for safe use of DME/AE;Visual cues/gestures for sequencing;Tactile cues for sequencing;Tactile cues for weight shifting;Tactile cues for initiation;Visual cues for safe use of DME/AE;Manual facilitation for weight shifting Transfer (Assistive device): Rolling walker Locomotion  Gait Ambulation: Yes Gait Assistance: Contact Guard/Touching assist;Minimal Assistance - Patient > 75% Gait Distance (Feet): 200 Feet Assistive device: Rolling walker Gait Assistance Details: Verbal cues for safe use of DME/AE;Verbal cues for technique;Verbal cues for gait  pattern;Verbal cues for sequencing;Verbal cues for precautions/safety;Tactile cues for weight shifting;Tactile cues for placement;Visual cues for safe use of DME/AE;Tactile cues for sequencing Gait Gait: Yes Gait Pattern: Impaired Gait Pattern: Decreased step length - right;Poor foot clearance - right;Decreased hip/knee flexion - right;Narrow  base of support;Decreased weight shift to left (intermittently kicks back R leg of RW with R LE, doesn't push R side of AD forward symmetrically with L side so tends to veer R bumping into wall/objects) Gait velocity: significantly decreased Stairs / Additional Locomotion Stairs: Yes (pt with elevator access to apartment) Stairs Assistance: Minimal Assistance - Patient > 75% Stair Management Technique: Two rails;Step to pattern (L UE support on each HR, step-to pattern leading with L vs R LE on ascent (lacking sufficient power up in R LE) but leading with R LE on descent requiring manual facilitation to place R LE correctly down on next step) Number of Stairs: 4 Height of Stairs: 6 Ramp: Minimal Assistance - Patient >75% (using RW) Curb: Moderate Assistance - Patient 50 - 74% Wheelchair Mobility Wheelchair Mobility: No  Trunk/Postural Assessment  Cervical Assessment Cervical Assessment: Exceptions to [facility] (forward head) Thoracic Assessment Thoracic Assessment: Exceptions to [facility] (rounded shoulders with stiffness in thoracic spine (limited rotation flexibility)) Lumbar Assessment Lumbar Assessment: Exceptions to [facility] (posterior pelvic tilt) Postural Control Postural Control: Deficits on evaluation Trunk Control: pt with persistent L posterior lean sitting EOB requiring L UE support for balance Righting Reactions: delayed and inadequate for minor or more perturbations Postural Limitations: decreased  Balance  Balance Balance Assessed: Yes Static Sitting Balance Static Sitting - Balance Support: Feet unsupported;Left upper extremity supported Static  Sitting - Level of Assistance: 5: Stand by assistance Dynamic Sitting Balance Dynamic Sitting - Balance Support: Feet supported;Left upper extremity supported Dynamic Sitting - Level of Assistance: 5: Stand by assistance;Other (comment) (CGA) Static Standing Balance Static Standing - Balance Support: During functional activity;Bilateral upper extremity supported Static Standing - Level of Assistance: Other (comment);4: Min assist (CGA) Dynamic Standing Balance Dynamic Standing - Balance Support: During functional activity;Bilateral upper extremity supported Dynamic Standing - Level of Assistance: 4: Min assist Extremity Assessment      RLE Assessment RLE Assessment: Exceptions to [facility] Active Range of Motion (AROM) Comments: WFL for tasks assessed General Strength Comments: assessed in sitting with pt having some difficulty motor planning and following instructions RLE Strength Right Hip Flexion: 3+/5 Right Knee Flexion: 4-/5 Right Knee Extension: 4-/5 (lacks terminal knee extension but is able to maintain position against slight to moderate pressure) Right Ankle Dorsiflexion: 4-/5 Right Ankle Plantar Flexion: 4-/5 RLE Tone RLE Tone Comments: continues to have mild R LE extensor tone noted primarily in supine LLE Assessment LLE Assessment: Exceptions to [facility] Active Range of Motion (AROM) Comments: WFL for tasks assessed General Strength Comments: assessed in sitting with pt having some difficulty motor planning and following instructions LLE Strength Left Hip Flexion: 3/5 Left Knee Flexion: 3+/5 Left Knee Extension: 4/5 Left Ankle Dorsiflexion: 4-/5 Left Ankle Plantar Flexion: 4-/5   Tawana Scale , PT, DPT, NCS, CSRS 09/14/2022, 7:58 AM

## 2022-09-14 NOTE — Progress Notes (Addendum)
Hypoglycemic Event  CBG: 56  Treatment: 8 oz juice/soda  Symptoms: None  Follow-up CBG: Time:  0650 CBG Result:59  Possible Reasons for Event: Unknown  Comments/MD notified:paged Dr Dennie Bible and Garrard.    Raeanne Gathers Renee  Am CBG low again at 56 and 59 despite adding Dextrose IVF.  Snack of apple juice, OJ, and 1 cup of pudding given.  Will retake.

## 2022-09-14 NOTE — Procedures (Signed)
Modified Barium Swallow Study  Patient Details  Name: Melvin Taylor MRN: [id number] Date of Birth: 08-Nov-1965  Today's Date: 09/14/2022  Modified Barium Swallow completed.  Full report located under Chart Review in the Imaging Section.  History of Present Illness 57 y.o. male with medical history significant of bipolar d/o vs/ schizophrenia, chronic chest wall/neck/back pain, CAD s/p CABG, DM, HTN, polysubstance abuse, OSA, and seizure d/o presenting with AMS.  He was admitted to [facility] from 1/20-27 with AMS, minimally improving during hospitalization.  He was noted to have a persistent punctate focus of restricted diffusion in the L medial temporal lobe, concerning for acute/subacute CVA.  MRI/CT with SDH that developed during the hospitalization, 4 mm in L occipital region.  He has been on IV Keppra throughout the hospitalization without apparent seizure activity.  He is able to answer very limited questions, none about orientation.  He does follow some commands   Clinical Impression MBSS completed this date to assess readiness for upgrading diet textures in preparation of d/c home next date. Pt with similar presentation to MBSS completed on 08/30/2022. 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 Oral phase limited by oral holding and trace oral residuals post-swallow likely due to decreased lingual strength. Pharyngeal phase limited by anatomical variations and decreased ROM of laryngeal structures (appear relatively anchored). Decreased anterior hyolaryngeal elevation and excursion noted with partial epiglottic inversion, decreased UES relaxation with retrograde flow above UES, and diffuse pharyngeal residuals (mild to mild-moderate in volume). Of note, cervical spine hardware noted from previous procedure.   Aforementioned findings resulted in flash penetration (PAS 2) with nectar-thick liqiuds and honey-thick liquids  via tsp and cup. Deeper penetration with resultant silent aspiration (PAS 8) noted with thin liquid via cup and straw, particularly with larger boluses; no penetration nor aspiration observed with tsp or 5 cc medicine cup sips. No penetration nor aspiration noted with pureed and soft solid textures, though oral phase was prolonged and delayed. Edentulous status exacerbated oral manipulation with soft solid textures. Compensatory techniques of effortful swallow, use of straws, and multiple swallows were trialed and noted to be ineffective. Cued throat clear appeared effective in clearing penetrated/aspirated material, though will need skilled intervention and mass practice to recall and consistently utilize given suspected cognitive impairments.  Given objective findings, recommend continuation of current diet textures (Dysphagia 2 and nectar-thick liquids) with medications crushed in puree with assistance for self-feeding. Continue to recommend pharyngeal strengthening exercises targeting UES relaxation, cough strength + response (RMST), and hyolaryngeal elevation and excursion. Recommend trialing thin liquids via tsp or 5 cc medicine cup/Provale cup with SLP to assess tolerance and train pt on use of cough/throat clear, re-swallow technique. For comfort, pt may have tsp sips of thin water following oral care. Educated pt and family on findings.  Factors that may increase risk of adverse event in presence of aspiration ([place] 2021): Reduced cognitive function;Dependence for feeding and/or oral hygiene  Swallow Evaluation Recommendations Recommendations: PO diet PO Diet Recommendation: Dysphagia 2 (Finely chopped);Mildly thick liquids (Level 2, nectar thick) Liquid Administration via: Cup;Straw Medication Administration: Whole meds with puree Supervision: Patient able to self-feed;Staff to assist with self-feeding;Full supervision/cueing for swallowing strategies Postural changes: Position pt  fully upright for meals;Stay upright 30-60 min after meals Oral care recommendations: Oral care BID (2x/day)      Day Deery A Jalayia Bagheri 09/14/2022,12:46 PM

## 2022-09-14 NOTE — Progress Notes (Addendum)
Speech Language Pathology Discharge Summary  Patient Details  Name: Melvin Taylor MRN: [id number] Date of Birth: [birth year]  Date of Discharge from SLP service:September 14, 2022  Today's Date: 09/14/2022   Patient has met 4 of 4 long term goals.  Patient to discharge at overall Supervision level.   Clinical Impression/Discharge Summary:  Pt has demonstrated minimal progress during this admission. Per MBSS completed on 09/14/22, pt continues to present with silent aspiration of thin liquid (trace) due to oral holding and decreased hyolaryngeal elevation and epiglottic inversion (not much, if any improvement from MBSS completed on 08/30/2022). Recommend continuation of current diet Dysphagia 2 and nectar-thick liquids with supervision. May have thin water via tsp following thorough oral care. Pt and pt's sister education completed. Thickener and instructions on purchasing made available in pt's room. Per chart review, pt presents with cognitive-linguistic skills that are consistent with baseline at this time; therefore, goals discontinued. Recommend 24/7 supervision and assistance at time of d/c, as well as f/u therapy via [facility] to continue addressing dysphagia. May trial RMST and 5 cc Provale cup as available. Thank you for allowing [place] to care for Mr. Sarin during this admission.   Care Partner:  Caregiver Able to Provide Assistance: Yes  Type of Caregiver Assistance: Cognitive;Physical  Recommendation:  24 hour supervision/assistance;Home Health SLP  Rationale for SLP Follow Up: Reduce caregiver burden;Maximize swallowing safety;Maximize cognitive function and independence   Equipment: thickener   Reasons for discharge: Discharged from hospital   Patient/Family Agrees with Progress Made and Goals Achieved: Yes    Reza Crymes A Marcele Kosta 09/14/2022, 3:53 PM

## 2022-09-14 NOTE — Progress Notes (Signed)
Physical Therapy Session Note  Patient Details  Name: Melvin Taylor MRN: [id number] Date of Birth: Apr 18, 1966  Today's Date: 09/14/2022 PT Individual Time: 1110-1208  PT Individual Time Calculation (min): 58 min    Short Term Goals: Week 2:  PT Short Term Goal 1 (Week 2): = to LTGs based on ELOS  Skilled Therapeutic Interventions/Progress Updates:  Pt received supine in bed awake and with max encouragement agreeable to therapy session. Pt's hired caregiver not present - pt reports his sister updated him that the caregiver would not be able to make it. [employer] present to deliver pt's wheelchair - they delivered 16x16in wheelchair that does not have an adjustable depth despite request for 16x18 (width x depth) so pt's thighs are not supported as far out towards his knees as would be preferred, but it is still a safe set-up. Supine>sitting L EOB, HOB flat and not using bedrail, with supervision and increased time/effort and pt continuing to have significantly delayed initiation of this task requiring ~35mnutes to transition to sitting EOB. Pt able to sit EOB requiring L UE support for trunk control due to persistent L posterior lean/gradual LOB - despite cuing to scoot hips forward towards EOB, pt not initiating this requiring heavy min assist to scoot to allow him to improve his balance with feet supported on ground. Donned tennis shoes total assist for time. Sit>stand EOB>RW with heavy min assist as pt not willing to listen to cuing to push up with L hand from seat, but rather tries to pull up on RW - continues to have strong posterior lean bias when coming to stand. Gait training ~173fto w/c using RW with pt initially requiring consistent min assist to maintain balance due to posterior lean progressed to CGA. Therapist set-up pt's wheelchair with seat cushion and back rest to trial.  Transported to/from gym in w/c for time management and energy conservation. Sit>stand w/c>RW with pt now  listening to therapist's cuing to push up with L hand from armrests and comes to stand with light min assist. Gait training ~401fn dayroom using RW with CGA/intermittent light min assist to view Valentine's Day activities to work on directional changes and vision scanning during gait. Seated in w/c pt participated in making a Valentine's day card with total assist to work on fine motor skills. At end of session, pt left seated in w/c with needs in reach and seat belt alarm on with SW present.   Therapy Documentation Precautions:  Precautions Precautions: Fall, Other (comment) Precaution Comments: R hemiplegia with R UE flexor tone and R LE extensor tone (LE tone primarily in the bed), Aphasia, delayed processing Required Braces or Orthoses: Other Brace Other Brace: R UE aircast for tone management Restrictions Weight Bearing Restrictions: No   Pain: No moments of pain noted during session and no reports of pain.   Therapy/Group: Individual Therapy  CarTawana ScalePT, DPT, NCS, CSRS 09/14/2022, 7:57 AM

## 2022-09-14 NOTE — Progress Notes (Addendum)
Patient ID: Melvin Taylor, male   DOB: November 11, 1965, 57 y.o.   MRN: [id number] Team Conference Report to Patient/Family  Team Conference discussion was reviewed with the patient and caregiver, including goals, any changes in plan of care and target discharge date.  Patient and caregiver express understanding and are in agreement.  The patient has a target discharge date of 09/15/22.   Sw met with patient and provided conference updates. Sw made attempt to call patient sister, [name] at bedside to confirm d/c for tomorrow. Patient received a 16x16 WC instead of 16x18, SW will reach out to [employer]. No answer, left detailed VM. Sw will continue to FU.  Dyanne Iha 09/14/2022, 1:38 PM

## 2022-09-15 ENCOUNTER — Encounter (HOSPITAL_COMMUNITY)
Admission: RE | Admit: 2022-09-15 | Discharge: 2022-09-15 | Disposition: A | Discharge status: Home / Self Care | Payer: Medicaid Other | Source: Ambulatory Visit | Attending: Nephrology | Admitting: Nephrology

## 2022-09-15 LAB — GLUCOSE, CAPILLARY: Glucose-Capillary: 117 mg/dL — ABNORMAL HIGH (ref 70–99)

## 2022-09-15 NOTE — Progress Notes (Signed)
Inpatient Rehabilitation Care Coordinator Discharge Note   Patient Details  Name: Melvin Taylor MRN: [id number] Date of Birth: [birth year]   Discharge location: Home with sister and family to support  Length of Stay: 15 Days  Discharge activity level: ambulatory level Min Assist using RW  Home/community participation: Sister and family  Patient response [id number] Literacy - How often do you need to have someone help you when you read instructions, pamphlets, or other written material from your doctor or pharmacy?: Always  Patient response [id number] Isolation - How often do you feel lonely or isolated from those around you?: Patient declines to respond  Services provided included: MD, RD, PT, OT, SLP, RN, CM, TR, Pharmacy, Neuropsych, SW  Financial Services:  Charity fundraiser Utilized: Medicaid    Choices offered to/list presented to: [place], sister  Follow-up services arranged:  Home Health, DME Home Health Agency: [facility] OT SLP    DME : Wheelchair and TTB    Patient response to transportation need: Is the patient able to respond to transportation needs?: No In the past 12 months, has lack of transportation kept you from medical appointments or from getting medications?: No In the past 12 months, has lack of transportation kept you from meetings, work, or from getting things needed for daily living?: No    Comments (or additional information):  Patient/Family verbalized understanding of follow-up arrangements:  Yes  Individual responsible for coordination of the follow-up plan: [employer]  [id number]  Confirmed correct DME delivered: Dyanne Iha 09/15/2022    Dyanne Iha

## 2022-09-15 NOTE — Progress Notes (Addendum)
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  Had been seen by endo in past, reached out to Rayetta Pigg NP from [facility], reduced intake plus reactive hypoglycemia to sweets suspected  ROS- cannot obtain due to cognitive deficits  Objective:   DG Swallowing Func-Speech Pathology  Result Date: 09/14/2022 Table formatting from the original result was not included. Modified Barium Swallow Study Patient Details Name: Melvin Taylor MRN: [id number] Date of Birth: 1965/12/23 Today's Date: 09/14/2022 HPI/PMH: HPI: 57 y.o. male with medical history significant of bipolar d/o vs/ schizophrenia, chronic chest wall/neck/back pain, CAD s/p CABG, DM, HTN, polysubstance abuse, OSA, and seizure d/o presenting with AMS.  He was admitted to [facility] from 1/20-27 with AMS, minimally improving during hospitalization.  He was noted to have a persistent punctate focus of restricted diffusion in the L medial temporal lobe, concerning for acute/subacute CVA.  MRI/CT with SDH that developed during the hospitalization, 4 mm in L occipital region.  He has been on IV Keppra throughout the hospitalization without apparent seizure activity.  He is able to answer very limited questions, none about orientation.  He does follow some commands Clinical Impression: Clinical Impression: MBSS completed this date to assess readiness for upgrading diet textures in preparation of d/c home next date. Pt with similar presentation to MBSS completed on 08/30/2022. 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 Oral phase limited by oral holding and trace oral residuals post-swallow likely due to decreased lingual strength. Pharyngeal phase limited by  anatomical variations and decreased ROM of laryngeal structures (appear relatively anchored). Decreased anterior hyolaryngeal elevation and excursion noted with partial epiglottic inversion, decreased UES relaxation with retrograde flow above UES, and diffuse pharyngeal residuals (mild to mild-moderate in volume). Of note, cervical spine hardware noted from previous procedure.  Aforementioned findings resulted in flash penetration (PAS 2) with nectar-thick liqiuds and honey-thick liquids via tsp and cup. Deeper penetration with resultant silent aspiration (PAS 8) noted with thin liquid via cup and straw, particularly with larger boluses; no penetration nor aspiration observed with tsp or 5 cc medicine cup sips. No penetration nor aspiration noted with pureed and soft solid textures, though oral phase was prolonged and delayed. Edentulous status exacerbated oral manipulation with soft solid textures. Compensatory techniques of effortful swallow, use of straws, and multiple swallows were trialed and noted to be ineffective. Cued throat clear appeared effective in clearing penetrated/aspirated material, though will need skilled intervention and mass practice to recall and consistently utilize given suspected cognitive impairments.  Given objective findings, recommend continuation of current diet textures (Dysphagia 2 and nectar-thick liquids) with medications crushed in puree with assistance for self-feeding. Continue to recommend pharyngeal strengthening exercises targeting UES relaxation, cough strength + response (RMST), and hyolaryngeal elevation and excursion. Recommend trialing thin liquids via tsp or 5 cc medicine cup/Provale cup with SLP to assess tolerance and train pt on use of cough/throat clear, re-swallow technique. For comfort, pt may have tsp sips of thin water  following oral care. Will educate pt and family on findings. Factors that may increase risk of adverse event in presence of aspiration ([place] 2021): Factors that may increase risk of adverse event in presence of aspiration ([place] 2021): Reduced cognitive function; Dependence for feeding and/or oral hygiene Recommendations/Plan: Swallowing Evaluation Recommendations Swallowing Evaluation Recommendations Recommendations: PO diet PO Diet Recommendation: Dysphagia 2 (Finely chopped); Mildly thick liquids (Level 2, nectar thick) Liquid Administration via: Cup; Straw Medication Administration: Whole meds with puree Supervision: Patient able to self-feed; Staff to assist with self-feeding; Full supervision/cueing for swallowing strategies Postural changes: Position pt fully upright for meals; Stay upright 30-60 min after meals Oral care recommendations: Oral care BID (2x/day) Caregiver Recommendations: Order thickener from pharmacy Treatment Plan Treatment Plan Treatment recommendations: Therapy as outlined in treatment plan below Follow-up recommendations: Home health SLP Functional status assessment: Patient has had a recent decline in their functional status and/or demonstrates limited ability to make significant improvements in function in a reasonable and predictable amount of time. Interventions: Aspiration precaution training; Oropharyngeal exercises; Compensatory techniques; Patient/family education; Trials of upgraded texture/liquids; Diet toleration management by SLP Recommendations Recommendations for follow up therapy are one component of a multi-disciplinary discharge planning process, led by the attending physician.  Recommendations may be updated based on patient status, additional functional criteria and insurance authorization. Assessment: Orofacial Exam: Orofacial Exam Oral Cavity: Oral Hygiene: WFL Oral Cavity - Dentition: Edentulous Anatomy: Anatomy: Presence of cervical hardware Thin Liquids: Thin Liquids (Level 0) Thin Liquids : Impaired Bolus delivery method: Cup Thin Liquid - Impairment: Oral Impairment; Pharyngeal  impairment; Esophageal impairment Lip Closure: Interlabial escape, no progression to anterior lip Tongue control during bolus hold: Cohesive bolus between tongue to palatal seal Bolus transport/lingual motion: Delayed initiation of tongue motion (oral holding) Oral residue: Trace residue lining oral structures Location of oral residue : Palate Initiation of swallow : Pyriform sinuses Soft palate elevation: Complete Laryngeal elevation: Partial or minimal superior movement and approximation Anterior hyoid excursion: Partial Epiglottic movement: Complete Laryngeal vestibule closure: Incomplete Pharyngeal stripping wave : Present - diminished Pharyngeal contraction (A/P view only): N/A Pharyngoesophageal segment opening: Partial distention/partial duration, partial obstruction of flow Tongue base retraction: No contrast between tongue base and posterior pharyngeal wall (PPW) Pharyngeal residue: Collection of residue within or on pharyngeal structures Location of pharyngeal residue: Diffuse (>3 areas) Penetration/Aspiration Scale (PAS) score: 8.  Material enters airway, passes BELOW cords without attempt by patient to eject out (silent aspiration); 1.  Material does not enter airway; 5.  Material enters airway, CONTACTS cords and not ejected out Esophageal impairment: Esophageal retention with retrograde flow through the PES  Mildly Thick Liquids: Mildly thick liquids (Level 2, nectar thick) Mildly thick liquids (Level 2, nectar thick): Impaired Bolus delivery method: Cup Mildly Thick Liquid - Impairment: Oral Impairment; Pharyngeal impairment; Esophageal impairment Lip Closure: Interlabial escape, no progression to anterior lip Tongue control during bolus hold: Cohesive bolus between tongue to palatal seal Bolus transport/lingual motion: Delayed initiation of tongue motion (oral holding) Oral residue: Trace residue lining oral structures Location of oral residue : Palate Initiation of swallow : Valleculae Soft palate  elevation: Complete Anterior hyoid excursion: Partial Epiglottic movement: Partial Laryngeal vestibule closure: Incomplete Pharyngeal stripping wave : Present - diminished Pharyngeal contraction (A/P view only): N/A Pharyngoesophageal segment opening: Partial distention/partial duration, partial obstruction of flow Tongue base retraction: No contrast between tongue base and posterior pharyngeal wall (PPW) Pharyngeal residue: Collection of residue within or on pharyngeal structures Location  of pharyngeal residue: Diffuse (>3 areas) Penetration/Aspiration Scale (PAS) score: 2.  Material enters airway, remains ABOVE vocal cords then ejected out; 1.  Material does not enter airway Esophageal impairment: Esophageal retention with retrograde flow through the PES  Moderately Thick Liquids: Moderately thick liquids (Level 3, honey thick) Moderately thick liquids (Level 3, honey thick): Impaired Bolus delivery method: Spoon Moderately Thick Liquid - Impairment: Oral Impairment; Pharyngeal impairment; Esophageal impairment Lip Closure: Interlabial escape, no progression to anterior lip Tongue control during bolus hold: Cohesive bolus between tongue to palatal seal Bolus transport/lingual motion: Delayed initiation of tongue motion (oral holding) Oral residue: Trace residue lining oral structures Location of oral residue : Tongue; Palate Initiation of swallow : Valleculae Soft palate elevation: Complete Laryngeal elevation: Partial or minimal superior movement and approximation Anterior hyoid excursion: Partial Epiglottic movement: Partial Laryngeal vestibule closure: Incomplete Pharyngeal stripping wave : Present - diminished Pharyngeal contraction (A/P view only): N/A Pharyngoesophageal segment opening: Partial distention/partial duration, partial obstruction of flow Tongue base retraction: No contrast between tongue base and posterior pharyngeal wall (PPW) Pharyngeal residue: Collection of residue within or on pharyngeal  structures Location of pharyngeal residue: Valleculae; Pyriform sinuses Penetration/Aspiration Scale (PAS) score: 2.  Material enters airway, remains ABOVE vocal cords then ejected out Esophageal impairment: Esophageal retention with retrograde flow through the PES  Puree: Puree Puree: Impaired Puree - Impairment: Oral Impairment; Pharyngeal impairment; Esophageal impairment Lip Closure: No labial escape Bolus transport/lingual motion: Delayed initiation of tongue motion (oral holding) Oral residue: Trace residue lining oral structures Location of oral residue : Palate; Tongue Initiation of swallow: Valleculae Soft palate elevation: Complete Laryngeal elevation: Partial or minimal superior movement and approximation Anterior hyoid excursion: Partial Epiglottic movement: Complete Laryngeal vestibule closure: Complete: No air/contrast in laryngeal vestibule Pharyngeal stripping wave : Present - diminished Pharyngoesophageal segment opening: Partial distention/partial duration, partial obstruction of flow Tongue base retraction: No contrast between tongue base and posterior pharyngeal wall (PPW) Pharyngeal residue: Collection of residue within or on pharyngeal structures Location of pharyngeal residue: Valleculae; Pyriform sinuses Penetration/Aspiration Scale (PAS) score: 1.  Material does not enter airway Esophageal impairment: Esophageal retention with retrograde flow through the PES Solid: Solid Solid: Impaired Solid - Impairment: Oral Impairment; Pharyngeal impairment; Esophageal impairment Lip Closure: No labial escape Bolus preparation/mastication: Slow prolonged chewing/mashing with complete recollection Bolus transport/lingual motion: Delayed initiation of tongue motion (oral holding) Oral residue: Trace residue lining oral structures Location of oral residue : Palate Initiation of swallow: Valleculae Soft palate elevation: Complete Laryngeal elevation: Partial or minimal superior movement and approximation  Anterior hyoid excursion: Partial Epiglottic movement: Complete Laryngeal vestibule closure: Complete: No air/contrast in laryngeal vestibule Pharyngeal stripping wave : Present - diminished Pharyngeal contraction (A/P view only): N/A Pharyngoesophageal segment opening: Partial distention/partial duration, partial obstruction of flow Tongue base retraction: No contrast between tongue base and posterior pharyngeal wall (PPW) Pharyngeal residue: Collection of residue within or on pharyngeal structures Location of pharyngeal residue: Valleculae; Pyriform sinuses Penetration/Aspiration Scale (PAS) score: 1.  Material does not enter airway Esophageal impairment: Esophageal retention with retrograde flow through the PES Pill: Pill Pill: Not Tested Compensatory Strategies: Compensatory Strategies Compensatory strategies: Yes Straw: Ineffective Ineffective Straw: Thin liquid (Level 0) Effortful swallow: Ineffective Ineffective Effortful Swallow: Thin liquid (Level 0) Multiple swallows: Ineffective Ineffective Multiple Swallows: Thin liquid (Level 0)   General Information: Caregiver present: No  Diet Prior to this Study: Dysphagia 2 (finely chopped); Mildly thick liquids (Level 2, nectar thick)   Temperature : Normal   Respiratory  Status: WFL   Supplemental O2: None (Room air)   History of Recent Intubation: No  Behavior/Cognition: Alert; Requires cueing; Cooperative Self-Feeding Abilities: Needs assist with self-feeding Baseline vocal quality/speech: Normal No data recorded Volitional Swallow: Able to elicit Exam Limitations: Other (comment) (Curvature of cervical spine - head forward posture at rest) Goal Planning: Prognosis for improved oropharyngeal function: Good Barriers to Reach Goals: Cognitive deficits; Time post onset; Behavior No data recorded Patient/Family Stated Goal: nothing stated Consulted and agree with results and recommendations: Pt unable/family or caregiver not available; Other (comment) (SLP) Pain:  Pain Assessment Pain Assessment: 0-10 Pain Score: 0 Faces Pain Scale: 6 Breathing: 1 Negative Vocalization: 1 Facial Expression: 1 Body Language: 1 Consolability: 0 PAINAD Score: 4 Pain Location: RUE with PROM elbow extension, forearm supination, and shoulder ROM Pain Descriptors / Indicators: Discomfort; Grimacing Pain Intervention(s): Limited activity within patient's tolerance; Monitored during session; Repositioned End of Session: Start Time:SLP Start Time (ACUTE ONLY): 0915 Stop Time: SLP Stop Time (ACUTE ONLY): 0930 Time Calculation:SLP Time Calculation (min) (ACUTE ONLY): 15 min Charges: SLP Evaluations $ SLP Speech Visit: 1 Visit SLP Evaluations $BSS Swallow: 1 Procedure $MBS Swallow: 1 Procedure $ SLP EVAL LANGUAGE/SOUND PRODUCTION: 1 Procedure $Swallowing Treatment: 1 Procedure SLP visit diagnosis: SLP Visit Diagnosis: Cognitive communication deficit (R41.841); Aphasia (R47.01); Dysphagia, oropharyngeal phase (R13.12) Past Medical History: Past Medical History: Diagnosis Date  Bipolar 1 disorder (HCC)   depression/anxiety  Bone spur   left heel  Cervical radiculopathy   Chronic back pain   Chronic chest wall pain   Chronic neck pain   Coronary artery disease   a. s/p CABG in 03/2016 with LIMA-LAD, SVG-D1, SVG-RCA, and Seq SVG-mid and distal OM  Diabetes mellitus   Type II  Hallucinations   "long history of them"  Headache(784.0)   History of gout   HTN (hypertension)   Insomnia   Neuropathy   Pain management   Polysubstance abuse ([place])   Right leg pain   chronic  Schizophrenia ([place])   Seizures ([place])   last sz between July 5-9th, 2016; epilepsy  Sleep apnea   Stroke ([place])   Transfusion of blood product refused for religious reason  Past Surgical History: Past Surgical History: Procedure Laterality Date  ANTERIOR CERVICAL DECOMP/DISCECTOMY FUSION N/A 12/14/2015  Procedure: ANTERIOR CERVICAL DECOMPRESSION/DISCECTOMY FUSION CERVICAL FIVE -SIX;  Surgeon: Earnie Larsson, MD;  Location: [facility];  Service:  Neurosurgery;  Laterality: N/A;  BIOPSY  05/07/2015  Procedure: BIOPSY (Gastric);  Surgeon: [name], MD;  Location: [facility];  Service: Endoscopy;;  CARDIAC CATHETERIZATION N/A 03/07/2016  Procedure: Left Heart Cath and Coronary Angiography;  Surgeon: Belva Crome, MD;  Location: [facility];  Service: Cardiovascular;  Laterality: N/A;  COLONOSCOPY WITH PROPOFOL N/A 05/07/2015  [id number] diverticulosis, multiple colon polyps removed, tubular adenoma, serrated colon polyp. Next colonoscopy October 2019  COLONOSCOPY WITH PROPOFOL N/A 08/02/2018  Procedure: COLONOSCOPY WITH PROPOFOL;  Surgeon: [name], MD;  Location: [facility];  Service: Endoscopy;  Laterality: N/A;  12:00pm  CORONARY ARTERY BYPASS GRAFT N/A 03/28/2016  Procedure: CORONARY ARTERY BYPASS GRAFTING (CABG) x 5 USING GREATER SAPHENOUS VEIN;  Surgeon: [name], MD;  Location: [facility];  Service: Open Heart Surgery;  Laterality: N/A;  ENDOVEIN HARVEST OF GREATER SAPHENOUS VEIN Right 03/28/2016  Procedure: ENDOVEIN HARVEST OF GREATER SAPHENOUS VEIN;  Surgeon: [name], MD;  Location: [facility];  Service: Open Heart Surgery;  Laterality: Right;  ESOPHAGEAL DILATION N/A 05/07/2015  Procedure: ESOPHAGEAL DILATION WITH 56FR MALONEY DILATOR;  Surgeon: [name], MD;  Location: [facility];  Service: Endoscopy;  Laterality: N/A;  ESOPHAGOGASTRODUODENOSCOPY (EGD) WITH PROPOFOL N/A 05/07/2015  RMR: Status post dilation of normal esophagus. Gastritis.  IR CT HEAD LTD  12/17/2020  IR CT HEAD LTD  12/17/2020  IR INTRA CRAN STENT  12/17/2020  IR PERCUTANEOUS ART THROMBECTOMY/INFUSION INTRACRANIAL INC DIAG ANGIO  12/17/2020  IR RADIOLOGIST EVAL & MGMT  03/25/2021  KNEE SURGERY Left   arthroscopy  MANDIBLE FRACTURE SURGERY    POLYPECTOMY  05/07/2015  Procedure: POLYPECTOMY (Hepatic Flexure, Distal Transverse Colon, Rectal);  Surgeon: [name], MD;  Location: [facility];  Service: Endoscopy;;  RADIOLOGY WITH ANESTHESIA N/A 12/16/2020  Procedure: IR WITH  ANESTHESIA;  Surgeon: Luanne Bras, MD;  Location: [facility];  Service: Radiology;  Laterality: N/A;  TEE WITHOUT CARDIOVERSION N/A 03/28/2016  Procedure: TRANSESOPHAGEAL ECHOCARDIOGRAM (TEE);  Surgeon: [name], MD;  Location: [place] [facility];  Service: Open Heart Surgery;  Laterality: N/A;  TONSILLECTOMY    TRIGGER FINGER RELEASE Left 10/15/2021  Procedure: RELEASE TRIGGER FINGER/A-1 PULLEY left middle or long finger;  Surgeon: Carole Civil, MD;  Location: [facility];  Service: Orthopedics;  Laterality: Left; Bethany A Lutes 09/14/2022, 3:26 PM  Recent Labs    09/12/22 0924  WBC 3.4*  HGB 8.5*  HCT 25.9*  PLT 200    Recent Labs    09/12/22 0924 09/14/22 1053  NA 133* 133*  K 5.1 4.8  CL 102 101  CO2 27 24  GLUCOSE 89 106*  BUN 30* 37*  CREATININE 1.63* 1.88*  CALCIUM 8.6* 8.7*     Intake/Output Summary (Last 24 hours) at 09/15/2022 0849 Last data filed at 09/15/2022 0400 Gross per 24 hour  Intake 720 ml  Output --  Net 720 ml         Physical Exam: Vital Signs Blood pressure 119/74, pulse (!) 56, temperature 98.3 F (36.8 C), resp. rate 16, height 5' 5"$  (1.651 m), weight 52.4 kg, SpO2 100 %.   General: No acute distress. EOB dressing with OT  Heart: Regular rate and rhythm no rubs murmurs or extra sounds  Lungs: Clear to auscultation, breathing unlabored, no rales or wheezes Abdomen: Positive bowel sounds, soft nontender to palpation, nondistended Extremities: No clubbing, cyanosis, or edema Skin: No evidence of breakdown, no evidence of rash Neurologic: Moving all 4 extremities in bed, antigravity and against resistance. Oriented to self and year with cues. Delayed responses.  Can follow simple commands. CN 2-12 grossly intact Psych: inappropriate affect      Musculoskeletal:RIght shoulder pain limits abd and flexion , also limited ROM with ER, pain to palp Right wrist no swelling erythema or deformity (? Hyperalgesia  CVA related) no elow  deformity , mild  tenderness R acromian   Assessment/Plan: 1. Functional deficits due to encephalopathy  Stable for D/C today F/u PCP in 3-4 weeks F/u PM&R 2 weeks F/u Nephro F/u [facility] See D/C summary See D/C instructions   Care Tool:  Bathing    Body parts bathed by patient: Chest, Abdomen, Right upper leg, Left upper leg, Face, Right arm, Front perineal area, Buttocks   Body parts bathed by helper: Left arm, Buttocks, Right lower leg, Left lower leg     Bathing assist Assist Level: Moderate Assistance - Patient 50 - 74%     Upper Body Dressing/Undressing Upper body dressing   What is the patient wearing?: Pull over shirt  Upper body assist Assist Level: Minimal Assistance - Patient > 75%    Lower Body Dressing/Undressing Lower body dressing      What is the patient wearing?: Underwear/pull up, Pants     Lower body assist Assist for lower body dressing: Moderate Assistance - Patient 50 - 74%     Toileting Toileting    Toileting assist Assist for toileting: Moderate Assistance - Patient 50 - 74%     Transfers Chair/bed transfer  Transfers assist     Chair/bed transfer assist level: Minimal Assistance - Patient > 75% Chair/bed transfer assistive device: Armrests, Programmer, multimedia   Ambulation assist      Assist level: Minimal Assistance - Patient > 75% Assistive device: Walker-rolling Max distance: 29f   Walk 10 feet activity   Assist     Assist level: Contact Guard/Touching assist Assistive device: Walker-rolling   Walk 50 feet activity   Assist Walk 50 feet with 2 turns activity did not occur: Safety/medical concerns  Assist level: Minimal Assistance - Patient > 75% Assistive device: Walker-rolling    Walk 150 feet activity   Assist Walk 150 feet activity did not occur: Safety/medical concerns  Assist level: Minimal Assistance - Patient > 75% Assistive device: Walker-rolling    Walk 10 feet on uneven surface   activity   Assist Walk 10 feet on uneven surfaces activity did not occur: Safety/medical concerns   Assist level: Minimal Assistance - Patient > 75% Assistive device: WChemical engineer    Assist Is the patient using a wheelchair?: Yes Type of Wheelchair: Manual    Wheelchair assist level: Dependent - Patient 0%      Wheelchair 50 feet with 2 turns activity    Assist        Assist Level: Dependent - Patient 0%   Wheelchair 150 feet activity     Assist      Assist Level: Dependent - Patient 0%   Blood pressure 119/74, pulse (!) 56, temperature 98.3 F (36.8 C), resp. rate 16, height 5' 5"$  (1.651 m), weight 52.4 kg, SpO2 100 %.  Medical Problem List and Plan: 1. Functional deficits secondary to acute metabolic encephalopathy.  Stable trace subdural hematoma along the posterior falx and posterior occipital lobe.  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  Received inpatient rehab services 12/21/2020 - 12/30/2020 after CVA             -patient may shower             -ELOS/Goals: 09/15/2022 Min A level              -Continue CIR,  PT OT and SLP -walked 20 ft with RW  MOD A with PT    2.  Antithrombotics: -DVT/anticoagulation:  Mechanical:  Antiembolism stockings, knee (TED hose) Bilateral lower extremities             -antiplatelet therapy: Brilinta 90 mg twice daily 3. 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 Mood/Behavior/Sleep: more leathargic since admit, will d/c   Valium( not a home med), D/C Keppra, restart lamictal after review of home meds with pt's sister (POA) and neuro  Trial modafanil  during inpt stay will d/c when he goes home -seen by neuropsychology 09/10/22- limited eval due to  cognition 5. Neuropsych/cognition: This patient is not capable of making decisions on his own behalf. 6. Skin/Wound Care: Routine skin checks 7. Fluids/Electrolytes/Nutrition: Routine in and outs with follow-up chemistries  -2/4: See BMP results above; Na 133->134, K 5.7->5.5, and Cr 1.64->1.53 with 1 L IVF yesterday. Continue today, due to hypoglycemia switching to D5 1/2NS 1L overnight. Encouraged PO fluids. AM labs.  - Calorie counts, dietary consult placed to see if  diet can be further optimized to reduce hypoglycemic events 2/5 will order lokelma for K+ 5.8, give 10g lokelma  2/6 K+ continues to be elevated, increase IVF to123m/hr, change ensure to nepro, discussed with pharmacy-no meds likely contributing, lokelma TID for 3 doses planned -Addendum K+ down to 4.6, will hold off on further lokelma, recheck tomorrow    Latest Ref Rng & Units 09/14/2022   10:53 AM 09/12/2022    9:24 AM 09/09/2022    5:29 AM  BMP  Glucose 70 - 99 mg/dL 106  89  60   BUN 6 - 20 mg/dL 37  30  15   Creatinine 0.61 - 1.24 mg/dL 1.88  1.63  1.11   Sodium 135 - 145 mmol/L 133  133  136   Potassium 3.5 - 5.1 mmol/L 4.8  5.1  4.4   Chloride 98 - 111 mmol/L 101  102  104   CO2 22 - 32 mmol/L 24  27  23   $ Calcium 8.9 - 10.3 mg/dL 8.7  8.6  8.5      8.  Seizure disorder/epilepsy.  EEG negative.  Keppra 1000 mg every 12 hours, Depakote 500 mg twice a day  9.  Diabetes mellitus.  Currently SSI.  per sister not on Lantus at home  CBG (last 3)   2/15 no further hypoglycemic events noted 10.  Hyperlipidemia.  Lipitor 870mdaily 11.  CAD with CABG 2017.  Continue Imdur 30 mg daily.  No chest pain or shortness of breath. 12.  History of hypertension.  Patient on Norvasc 10 mg daily, Lopressor 12.5 mg twice daily, lisinopril 5 mg daily, -2/12 well controlled Vitals:   09/14/22 1927 09/15/22 0406  BP: 105/84 119/74  Pulse: (!) 56 (!) 56  Resp: 16 16  Temp: 98.4 F (36.9 C) 98.3 F (36.8 C)  SpO2: 100% 100%       13.  COVID-19.  Resolved.  Precautions have been discontinued 14. Hyponatremic, Na 129  -2/5, was given D5 1/2 NS last night, will start D5NS 508mr  -2/6 Na improved to 135 today  Recheck tomorrow 15. AKI  -2/5 Start IVF  2/6 improving, increase IVF to 100m86m      Latest Ref Rng & Units 09/14/2022   10:53 AM 09/12/2022    9:24 AM 09/09/2022    5:29 AM  BMP  Glucose 70 - 99 mg/dL 106  89  60   BUN 6 - 20 mg/dL 37  30  15   Creatinine 0.61 - 1.24 mg/dL 1.88  1.63  1.11   Sodium 135 - 145 mmol/L 133  133  136   Potassium 3.5 - 5.1 mmol/L 4.8  5.1  4.4   Chloride 98 - 111 mmol/L 101  102  104   CO2 22 - 32 mmol/L 24  27  23   $ Calcium 8.9 - 10.3 mg/dL 8.7  8.6  8.5    CKD f/u nephro as an  outpt 2/11 Recheck BMET tomorrow  Shoulder injectionRight    Indication:Right Shoulder pain not relieved by medication management and other conservative care.  Informed consent was obtained after describing risks and benefits of the procedure with the patient, this includes bleeding, bruising, infection and medication side effects. The patient wishes to proceed and has given written consent. Patient was placed in a seated position. TheRight shoulder was marked and prepped with betadine in the subacromial area. A 25-gauge 1 inch needle was inserted into the subacromial area. After negative draw back for blood, a solution containing 1 mL of 6 mg per ML betamethasone and 4 mL of 1% lidocaine was injected. A band aid was applied. The patient tolerated the procedure well. Post procedure instructions were given.   LOS: 15 days A FACE TO FACE EVALUATION WAS PERFORMED  Charlett Blake 09/15/2022, 8:49 AM

## 2022-09-15 NOTE — Progress Notes (Signed)
Inpatient Rehabilitation Discharge Medication Review by a Pharmacist   A complete drug regimen review was completed for this patient to identify any potential clinically significant medication issues.  High Risk Drug Classes Is patient taking? Indication by Medication  Antipsychotic No   Anticoagulant No   Antibiotic No   Opioid No   Antiplatelet Yes Brilinta - CAD  Hypoglycemics/insulin No   Vasoactive Medication Yes Imdur,  metoprolol - CAD, BP  Chemotherapy No   Other Yes Atorvastatin - HLD  Albuterol inhaler- prn  shortness of breath or wheezing Ferrous sulfate- iron supplement  Lexapro - mood Lamotrigine, valproic acid oral solution- - seizure history Vitamin D-supplement      Type of Medication Issue Identified Description of Issue Recommendation(s)  Drug Interaction(s) (clinically significant)     Duplicate Therapy     Allergy     No Medication Administration End Date     Incorrect Dose     Additional Drug Therapy Needed     Significant med changes from prior encounter (inform family/care partners about these prior to discharge). Discontinued allopurinol,  amlodipine, diazepam, humalog insulin, famotidine, levetiracetam, lisinopril. Patient instructed on discharge medication list to stop these medications.   Other      Clinically significant medication issues were identified that warrant physician communication and completion of prescribed/recommended actions by midnight of the next day:  No  Pharmacist comments: None  Time spent performing this drug regimen review (minutes):  30 minutes   Thank you for allowing pharmacy to be part of this patients care team. Nicole Cella, RPh Clinical Pharmacist 09/15/2022

## 2022-09-15 NOTE — Progress Notes (Signed)
Patient ID: Melvin Taylor, male   DOB: 12/08/1965, 57 y.o.   MRN: [id number]  Sister waiting for TTB to be delivered.

## 2022-09-19 ENCOUNTER — Telehealth: Payer: Self-pay | Admitting: *Deleted

## 2022-09-19 NOTE — Telephone Encounter (Signed)
Contacted [facility] to verify order for OT PT SLP  received, they do have an open order and was scheduled to visit patient on Wednesday. Also called patient sister back and notified if she couldn't retrieve new patient paperwork then have aid bring patient in at 12:30. Nothing further needed.

## 2022-09-19 NOTE — Telephone Encounter (Signed)
Transitional Care call--who you talked with patient's sister Melvin Taylor    Are you/is patient experiencing any problems since coming home? No Are there any questions regarding any aspect of care? no Are there any questions regarding medications administration/dosing?No Are meds being taken as prescribed? Yes Patient should review meds with caller to confirm per sister no change in patient's medications. Patient is taking meds as prescribed. Have there been any falls?No Has Home Health been to the house and/or have they contacted you? If not, have you tried to contact them? Can we help you contact them? Patient already has an aid through [facility]. Patient's sister states they on offer in home care. Patient has not been contacted by PT OT SLP Are bowels and bladder emptying properly? Are there any unexpected incontinence issues? If applicable, is patient following bowel/bladder programs? No Any fevers, problems with breathing, unexpected pain?No Are there any skin problems or new areas of breakdown?No Has the patient/family member arranged specialty MD follow up (ie cardiology/neurology/renal/surgical/etc)?  Can we help arrange? Patient's sister will call and arrange f/u with Dr. Theador Hawthorne. Does the patient need any other services or support that we can help arrange? No, they were contacted my MCD on yesterday and sister informed that patient would need some adult diapers. Are caregivers following through as expected in assisting the patient? Yes Has the patient quit smoking, drinking alcohol, or using drugs as recommended? Patient does not drink, smoke or use drugs currently.  Appointment time, 12:40 arrive time  1:00 pm with here [place]

## 2022-09-19 NOTE — Telephone Encounter (Deleted)
Melvin Taylor ([id number])  form thumbnail [employer] is processing your PA request and will respond shortly with next steps. You are currently using the fastest method to process this prior authorization. Please do not fax or call [employer] to resubmit this request. To check for an update later, open this request again from your dashboard.

## 2022-09-28 ENCOUNTER — Encounter: Payer: Self-pay | Admitting: Registered Nurse

## 2022-09-28 ENCOUNTER — Encounter: Payer: Medicaid Other | Attending: Registered Nurse | Admitting: Registered Nurse

## 2022-09-28 VITALS — BP 110/71 | HR 63 | Ht 65.0 in | Wt 125.0 lb

## 2022-09-28 DIAGNOSIS — S065XAA Traumatic subdural hemorrhage with loss of consciousness status unknown, initial encounter: Secondary | ICD-10-CM | POA: Diagnosis present

## 2022-09-28 DIAGNOSIS — I10 Essential (primary) hypertension: Secondary | ICD-10-CM | POA: Insufficient documentation

## 2022-09-28 DIAGNOSIS — G9341 Metabolic encephalopathy: Secondary | ICD-10-CM | POA: Diagnosis present

## 2022-09-28 NOTE — Progress Notes (Signed)
Subjective:    Patient ID: Melvin Taylor, male    DOB: October 29, 1965, 57 y.o.   MRN: [id number]  HPI: Melvin Taylor is a 57 y.o. male who is here for Transitional Care Visit for follow up of his Subdural Hematoma, Acute Metabolic Encephalopathy and Essential Hypertension. He presented to [facility] on 08/27/2022 with altered mental status.  Dr Lorin Mercy H&P on 08/27/2022 Chief Complaint: AMS   HPI: Melvin Taylor is a 57 y.o. male with medical history significant of bipolar d/o vs/ schizophrenia, chronic chest wall/neck/back pain, CAD s/p CABG, DM, HTN, polysubstance abuse, OSA, and seizure d/o presenting with AMS.  He was admitted to [facility] from 1/20-27 with AMS, minimally improving during hospitalization.  He was noted to have a persistent punctate focus of restricted diffusion in the L medial temporal lobe, concerning for acute/subacute CVA.  MRI/CT with SDH that developed during the hospitalization, 4 mm in L occipital region.  He has been on IV Keppra throughout the hospitalization without apparent seizure activity.  He is able to answer very limited questions, none about orientation.  He does follow some commands.     I spoke with his sister.  He only answers direct questions.  A week ago today, his aide fed him breakfast and he ate well.  He has multiple medical problems, has a [facility] aide for assistance at home.  At baseline he is able to walk, has a lift chair, have conversations, play video games.  His aide usually comes back at night to help him take his medications - but stopped by early, about 5pm.  He found him sitting in the floor propped against the bed.  Glucose 52.  ?fall - bruises on nose and forehead.  Given Gatorade with improved glucose, was able to get up and walk to his chair.  EMS came to check on him, he requested to go to the hospital.  Wednesday night, he was able to talk some, knew sister's name and childhood nicknames.  It took him a few minutes to answer, which was  out of character for him.  He had no symptoms of COVID despite the positive test - she would like for him to be retested.  CT Saturday, no sign of CVA.  She asked for MRI - said he had a brain bleed.  He was on Plavix vs. Brilinta at home.  Prior to this, he had full mobility without deficits.  She noticed last night that he had R hemiplegia and contracture.    CT Head: WO Contrast: IMPRESSION: 1. Stable trace subdural hematoma along the posterior falx and posterior the occipital lobe. 2. No new hemorrhage. 3. 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 4. Stable left MCA stent. 5. Left mastoid effusion.   Neurology  and neurosurgery was consulted.  He was admitted to inpatient rehabilitation on 08/31/2022 and discharged home on 09/15/2022.He is receiving Home Health Therapy with [facility].  Melvin Taylor receiving 24 hour care, per home health aide Broadus John). He denies any pain at this time. On his Health and History form his pain was rated 5. Also reports he has a good appetite.   Melvin Taylor arrived in wheelchair     Pain Inventory Average Pain 5 Pain Right Now 5 My pain is intermittent and sharp  LOCATION OF PAIN  right shoulder. Right leg  BOWEL Number of stools per week: 7   BLADDER Normal  Mobility walk without assistance walk with assistance use a cane use a walker ability to climb steps?  no do you drive?  no needs help with transfers Do you have any goals in this area?  yes  Function disabled: date disabled unknown  Neuro/Psych weakness numbness tremor trouble walking spasms dizziness confusion  Prior Studies Any changes since last visit?  no  Physicians involved in your care Any changes since last visit?  no   Family History  Problem Relation Age of Onset   Diabetes Father    Hypertension Father    Sleep apnea Father    Stroke Sister    Arthritis Other    Diabetes Other     Asthma Other    Social History   Socioeconomic History   Marital status: Single    Spouse name: Not on file   Number of children: Not on file   Years of education: 11th grade   Highest education level: Not on file  Occupational History   Occupation: unemployed    Employer: NOT EMPLOYED  Tobacco Use   Smoking status: Former    Packs/day: 0.50    Years: 30.00    Total pack years: 15.00    Types: Cigarettes   Smokeless tobacco: Never   Tobacco comments:    3 cigs per day  Vaping Use   Vaping Use: Never used  Substance and Sexual Activity   Alcohol use: Not Currently   Drug use: Not Currently    Types: Marijuana   Sexual activity: Not Currently  Other Topics Concern   Not on file  Social History Narrative   09/13/21 Lives alone, caregiver daily, manages meds   Drinks a cup of coffee a day   Social Determinants of Health   Financial Resource Strain: Not on file  Food Insecurity: No Food Insecurity (08/27/2022)   Hunger Vital Sign    Worried About Running Out of Food in the Last Year: Never true    Ran Out of Food in the Last Year: Never true  Transportation Needs: No Transportation Needs (08/27/2022)   PRAPARE - Hydrologist (Medical): No    Lack of Transportation (Non-Medical): No  Physical Activity: Not on file  Stress: Not on file  Social Connections: Not on file   Past Surgical History:  Procedure Laterality Date   ANTERIOR CERVICAL DECOMP/DISCECTOMY FUSION N/A 12/14/2015   Procedure: ANTERIOR CERVICAL DECOMPRESSION/DISCECTOMY FUSION CERVICAL FIVE -SIX;  Surgeon: Earnie Larsson, MD;  Location: [place] NEURO ORS;  Service: Neurosurgery;  Laterality: N/A;   BIOPSY  05/07/2015   Procedure: BIOPSY (Gastric);  Surgeon: [name], MD;  Location: [facility];  Service: Endoscopy;;   CARDIAC CATHETERIZATION N/A 03/07/2016   Procedure: Left Heart Cath and Coronary Angiography;  Surgeon: Belva Crome, MD;  Location: [place] CV LAB;  Service:  Cardiovascular;  Laterality: N/A;   COLONOSCOPY WITH PROPOFOL N/A 05/07/2015   [id number] diverticulosis, multiple colon polyps removed, tubular adenoma, serrated colon polyp. Next colonoscopy October 2019   COLONOSCOPY WITH PROPOFOL N/A 08/02/2018   Procedure: COLONOSCOPY WITH PROPOFOL;  Surgeon: [name], MD;  Location: [facility];  Service: Endoscopy;  Laterality: N/A;  12:00pm   CORONARY ARTERY BYPASS GRAFT N/A 03/28/2016   Procedure: CORONARY ARTERY BYPASS GRAFTING (CABG) x 5 USING GREATER SAPHENOUS VEIN;  Surgeon: [name], MD;  Location: [place] OR;  Service: Open Heart Surgery;  Laterality: N/A;   ENDOVEIN HARVEST OF GREATER SAPHENOUS VEIN Right 03/28/2016  Procedure: ENDOVEIN HARVEST OF GREATER SAPHENOUS VEIN;  Surgeon: [name], MD;  Location: [place];  Service: Open Heart Surgery;  Laterality: Right;   ESOPHAGEAL DILATION N/A 05/07/2015   Procedure: ESOPHAGEAL DILATION WITH 56FR MALONEY DILATOR;  Surgeon: [name], MD;  Location: [facility];  Service: Endoscopy;  Laterality: N/A;   ESOPHAGOGASTRODUODENOSCOPY (EGD) WITH PROPOFOL N/A 05/07/2015   RMR: Status post dilation of normal esophagus. Gastritis.   IR CT HEAD LTD  12/17/2020   IR CT HEAD LTD  12/17/2020   IR INTRA CRAN STENT  12/17/2020   IR PERCUTANEOUS ART THROMBECTOMY/INFUSION INTRACRANIAL INC DIAG ANGIO  12/17/2020   IR RADIOLOGIST EVAL & MGMT  03/25/2021   KNEE SURGERY Left    arthroscopy   MANDIBLE FRACTURE SURGERY     POLYPECTOMY  05/07/2015   Procedure: POLYPECTOMY (Hepatic Flexure, Distal Transverse Colon, Rectal);  Surgeon: [name], MD;  Location: [facility];  Service: Endoscopy;;   RADIOLOGY WITH ANESTHESIA N/A 12/16/2020   Procedure: IR WITH ANESTHESIA;  Surgeon: Luanne Bras, MD;  Location: [place];  Service: Radiology;  Laterality: N/A;   TEE WITHOUT CARDIOVERSION N/A 03/28/2016   Procedure: TRANSESOPHAGEAL ECHOCARDIOGRAM (TEE);  Surgeon: [name], MD;  Location: [place];  Service: Open Heart  Surgery;  Laterality: N/A;   TONSILLECTOMY     TRIGGER FINGER RELEASE Left 10/15/2021   Procedure: RELEASE TRIGGER FINGER/A-1 PULLEY left middle or long finger;  Surgeon: Carole Civil, MD;  Location: [facility];  Service: Orthopedics;  Laterality: Left;   Past Medical History:  Diagnosis Date   Bipolar 1 disorder ([place])    depression/anxiety   Bone spur    left heel   Cervical radiculopathy    Chronic back pain    Chronic chest wall pain    Chronic neck pain    Coronary artery disease    a. s/p CABG in 03/2016 with LIMA-LAD, SVG-D1, SVG-RCA, and Seq SVG-mid and distal OM   Diabetes mellitus    Type II   Hallucinations    "long history of them"   Headache(784.0)    History of gout    HTN (hypertension)    Insomnia    Neuropathy    Pain management    Polysubstance abuse ([place])    Right leg pain    chronic   Schizophrenia ([place])    Seizures ([place])    last sz between July 5-9th, 2016; epilepsy   Sleep apnea    Stroke [facility])    Transfusion of blood product refused for religious reason    There were no vitals taken for this visit.  Opioid Risk Score:   Fall Risk Score:  `1  Depression screen PHQ 2/9     03/10/2021    5:02 PM 01/07/2021   12:02 PM 12/21/2016    9:52 AM 04/26/2016    9:55 AM 04/05/2016   11:11 AM 03/11/2016    9:53 AM 07/23/2015    9:04 AM  Depression screen PHQ 2/9  Decreased Interest 0 0 0 0 0 0 0  Down, Depressed, Hopeless 0 0 0 0 0 0 0  PHQ - 2 Score 0 0 0 0 0 0 0  Altered sleeping  0       Tired, decreased energy  0       Change in appetite  0       Feeling bad or failure about yourself   0       Trouble concentrating  2  Moving slowly or fidgety/restless  2       Suicidal thoughts  0       PHQ-9 Score  4         Review of Systems  Musculoskeletal:  Positive for gait problem.  Neurological:  Positive for dizziness, tremors, weakness and numbness.       Spasms  All other systems reviewed and are negative.      Objective:   Physical  Exam Vitals and nursing note reviewed.  Constitutional:      Appearance: Normal appearance.  Cardiovascular:     Rate and Rhythm: Normal rate and regular rhythm.     Pulses: Normal pulses.     Heart sounds: Normal heart sounds.  Pulmonary:     Effort: Pulmonary effort is normal.     Breath sounds: Normal breath sounds.  Musculoskeletal:     Cervical back: Normal range of motion and neck supple.     Comments: Normal Muscle Bulk and Muscle Testing Reveals:  Upper Extremities: Right : Decreased ROM and Muscle Strength 1/5 Left Upper extremity: Full ROM and Muscle Strength 5/5 Lower Extremities: Right: Decreased ROM  Left Lower Extremity: Full ROM and Muscle Strength 5/5 Arrived in wheelchair    Skin:    General: Skin is warm and dry.  Neurological:     Mental Status: He is alert and oriented to person, place, and time.  Psychiatric:        Mood and Affect: Mood normal.        Behavior: Behavior normal.         Assessment & Plan:  Subdural Hematoma, Acute Metabolic Encephalopathy: He has a scheduled appointment with Neurology. Continue current medication regimen. Continue to monitor.   Essential Hypertension: Continue current medication regimen. PCP Following. Continue to Monitor.   F/U with Dr Letta Pate in 4- 6 weeks

## 2022-09-29 ENCOUNTER — Encounter: Payer: Self-pay | Admitting: Radiology

## 2022-09-29 ENCOUNTER — Encounter (HOSPITAL_COMMUNITY)
Admission: RE | Admit: 2022-09-29 | Discharge: 2022-09-29 | Disposition: A | Discharge status: Home / Self Care | Payer: Medicaid Other | Source: Ambulatory Visit | Attending: Nephrology | Admitting: Nephrology

## 2022-09-29 VITALS — BP 120/72 | HR 59 | Temp 97.9°F | Resp 16

## 2022-09-29 DIAGNOSIS — N1832 Chronic kidney disease, stage 3b: Secondary | ICD-10-CM | POA: Diagnosis not present

## 2022-09-29 DIAGNOSIS — D631 Anemia in chronic kidney disease: Secondary | ICD-10-CM | POA: Diagnosis present

## 2022-09-29 LAB — POCT HEMOGLOBIN-HEMACUE: Hemoglobin: 8.7 g/dL — ABNORMAL LOW (ref 13.0–17.0)

## 2022-09-29 MED ORDER — EPOETIN ALFA 2000 UNIT/ML IJ SOLN
2000.0000 [IU] | Freq: Once | INTRAMUSCULAR | Status: AC
  Administered 2022-09-29: 2000 [IU] via SUBCUTANEOUS
  Filled 2022-09-29: qty 1

## 2022-09-29 MED ORDER — EPOETIN ALFA 3000 UNIT/ML IJ SOLN: 3000.0000 [IU] | Freq: Once | INTRAMUSCULAR | Status: DC

## 2022-09-29 MED ORDER — EPOETIN ALFA 10000 UNIT/ML IJ SOLN: 5000.0000 [IU] | Freq: Once | INTRAMUSCULAR | Status: DC

## 2022-09-29 MED ORDER — EPOETIN ALFA 3000 UNIT/ML IJ SOLN
3000.0000 [IU] | Freq: Once | INTRAMUSCULAR | Status: DC
  Administered 2022-09-29: 3000 [IU] via INTRAVENOUS
  Filled 2022-09-29: qty 1

## 2022-09-29 NOTE — Addendum Note (Signed)
Encounter addended by: Baxter Hire, RN on: 09/29/2022 12:14 PM  Actions taken: MAR administration accepted

## 2022-09-29 NOTE — Progress Notes (Signed)
Diagnosis: Anemia in Chronic Kidney Disease  Provider:  Manpreet Bhutani MD  Procedure: Injection  Epogen (epoetin alfa), Dose: 5000U , Site: subcutaneous, Number of injections: 2  HGB 8.6  Post Care: Patient declined observation  Discharge: Condition: Good, Destination: Home . AVS Provided  Performed by:  Grayland Ormond, RN

## 2022-10-13 ENCOUNTER — Encounter (HOSPITAL_COMMUNITY)
Admission: RE | Admit: 2022-10-13 | Discharge: 2022-10-13 | Disposition: A | Discharge status: Home / Self Care | Payer: Medicaid Other | Source: Ambulatory Visit | Attending: Nephrology | Admitting: Nephrology

## 2022-10-13 ENCOUNTER — Encounter (HOSPITAL_COMMUNITY)
Admission: RE | Admit: 2022-10-13 | Payer: Medicaid Other | Source: Ambulatory Visit | Attending: Family Medicine | Admitting: Family Medicine

## 2022-10-13 VITALS — BP 103/77 | HR 86 | Temp 98.5°F | Resp 16

## 2022-10-13 DIAGNOSIS — N1832 Chronic kidney disease, stage 3b: Secondary | ICD-10-CM | POA: Diagnosis present

## 2022-10-13 DIAGNOSIS — D631 Anemia in chronic kidney disease: Secondary | ICD-10-CM | POA: Insufficient documentation

## 2022-10-13 LAB — POCT HEMOGLOBIN-HEMACUE: Hemoglobin: 9.3 g/dL — ABNORMAL LOW (ref 13.0–17.0)

## 2022-10-13 MED ORDER — EPOETIN ALFA 10000 UNIT/ML IJ SOLN
5000.0000 [IU] | Freq: Once | INTRAMUSCULAR | Status: AC
  Administered 2022-10-13: 5000 [IU] via SUBCUTANEOUS

## 2022-10-13 NOTE — Progress Notes (Signed)
Diagnosis: Anemia in Chronic Kidney Disease  Provider:  Manpreet Bhutani MD  Procedure: Injection  Epogen (epoetin alfa), Dose: 5000 units , Site: subcutaneous, Number of injections: 1  Hgb 9.3  Post Care: Patient declined observation  Discharge: Condition: Good, Destination: Home . AVS Declined  Performed by:  Binnie Kand, RN

## 2022-10-13 NOTE — Addendum Note (Signed)
Encounter addended by: Baxter Hire, RN on: 10/13/2022 2:37 PM  Actions taken: Therapy plan modified

## 2022-10-27 ENCOUNTER — Encounter (HOSPITAL_COMMUNITY)
Admission: RE | Admit: 2022-10-27 | Discharge: 2022-10-27 | Disposition: A | Discharge status: Home / Self Care | Payer: Medicaid Other | Source: Ambulatory Visit | Attending: Nephrology | Admitting: Nephrology

## 2022-10-27 VITALS — BP 108/70 | HR 71 | Temp 98.4°F | Resp 17

## 2022-10-27 DIAGNOSIS — N1832 Chronic kidney disease, stage 3b: Secondary | ICD-10-CM

## 2022-10-27 LAB — POCT HEMOGLOBIN-HEMACUE: Hemoglobin: 8 g/dL — ABNORMAL LOW (ref 13.0–17.0)

## 2022-10-27 MED ORDER — EPOETIN ALFA 10000 UNIT/ML IJ SOLN
5000.0000 [IU] | Freq: Once | INTRAMUSCULAR | Status: AC
  Administered 2022-10-27: 5000 [IU] via SUBCUTANEOUS

## 2022-10-27 NOTE — Progress Notes (Signed)
Diagnosis: Anemia in Chronic Kidney Disease  Provider:  Manpreet Bhutani MD  Procedure: Injection  Epogen (epoetin alfa), Dose: 5000 units , Site: subcutaneous, Number of injections: 1  HGB 8.0  Post Care: Patient declined observation  Discharge: Condition: Good, Destination: Home . AVS Provided and AVS Declined  Performed by:  Grayland Ormond, RN

## 2022-11-01 ENCOUNTER — Encounter: Payer: Medicaid Other | Attending: Registered Nurse | Admitting: Physical Medicine & Rehabilitation

## 2022-11-01 ENCOUNTER — Encounter: Payer: Self-pay | Admitting: Physical Medicine & Rehabilitation

## 2022-11-01 VITALS — BP 118/81 | HR 79 | Ht 65.0 in | Wt 120.0 lb

## 2022-11-01 DIAGNOSIS — I69359 Hemiplegia and hemiparesis following cerebral infarction affecting unspecified side: Secondary | ICD-10-CM | POA: Insufficient documentation

## 2022-11-01 NOTE — Progress Notes (Signed)
Subjective:    Patient ID: Melvin Taylor, male    DOB: 03/04/1966, 57 y.o.   MRN: [id number]   Hospital course pain management right shoulder pain mild AC arthritis likely incidental findings placed on Voltaren gel.  Mood stabilization with history of bipolar disorder/schizophrenia he remained on his Lexapro with Lamictal resumed and Depakote ongoing.  Trial of Provigil during the hospitalization discontinued at discharge.  Patient did receive followed by neuropsychology during his rehabilitation stay.  Lipitor ongoing for hyperlipidemia.  Hospital course incidental finding COVID-19 resolved precautions discontinued patient afebrile.  Mild hyponatremia 129 improved to 133-135 he did receive D5 one half normal saline for short time during his hospital stay.  Patient does have a history of CKD followed by nephrology services.He did receive his Epogen injection as PTA 09/13/2022. Acute on chronic anemia no bleeding episodes and remained on iron supplement with lattest HGB 8.5.  Blood sugars monitored latest hemoglobin A1c of 5.0 currently on no diabetic agents he had been on Lantus insulin in the past and has since been discontinued by PCP.  By report he was using glimepiride at home only if blood sugar greater than 150.  Patient did have some asymptomatic hypoglycemia occurred when patient missed a meal or refused bedtime snack.  Patient did have recent diagnosis of OSA in January and a home CPAP machine has been ordered for patient.  His oxygen saturations remained greater than 90% on room air.  He remains on dysphagia #2 nectar thick liquid diet followed by speech therapy with [facility] 09/14/2022.  Blood pressure controlled on Imdur as well as low-dose Lopressor  57 y.o. right-handed male with history of bipolar disorder/schizophrenia, CAD with CABG diabetes mellitus hypertension polysubstance abuse OSA and will not use CPAP seizure disorder/epilepsy maintained on Depakote as well as Lamictal 1 well as left  MCA infarction with stenting maintained on Brilinta and Plavix receiving inpatient rehab services 12/21/2020 - 12/30/2020.  He was discharged to home ambulating without assistive device.  Per chart review lives alone.  He has a home health aide 53 hours a week to assist with making meals dressing bathing and managing medications.  Sister does finances and has POA.  Presented 08/27/2022 from outside hospital after recent admission with altered mental status.  He was noted to have persistent punctate focus of restricted diffusion in the left temporal medial lobe concerning for acute/subacute CVA on imaging at outside hospital.  MRI showed trace SDH that developed during the hospitalization 4 mm in the left occipital region.  He was transferred to [facility].  Cranial CT scan showed stable trace subdural hematoma along the posterior falx and posteriorly occipital lobe.  Stable remote infarcts of the left basal ganglia, anterior left frontal lobe and corona radiata but no acute CVA noted.  Stable left MCA stent.  Admission chemistries unremarkable except potassium 3.2 BUN 39 hemoglobin 9.2.  Neurology follow-up maintained on Brilinta as prior to admission but Plavix held.  Neurology felt the risk of stent restenosis if there is prolonged cessation of Brilinta would most likely outweigh the risk of hemorrhage.  Neurosurgery signed off due to small size and stable appearance of subdural hematoma.  EEG negative for seizure.  Echocardiogram with ejection fraction of 60 to 65% no wall motion abnormalities grade 1 diastolic dysfunction.  Hospital course COVID questionably incidental finding patient asymptomatic completed antiviral/steroid course and maintained on airborne contact precautions.  He had completed his quarantine 08/31/2022.  Currently on a dysphagia #2 nectar thick  liquid diet.   Admit date: 08/31/2022 Discharge date: 09/15/2022  Admit date: 12/21/2020 Discharge date: 12/30/2020  57 year old right-handed  male with history of hypertension hyperlipidemia, CAD with myocardial infarction maintained on Plavix, epilepsy/seizures maintained on Depakote as well as Lamictal, schizophrenia, diabetes mellitus, tobacco abuse as well as polysubstance abuse questionable medical compliance.  Per chart review patient lives alone.  1 level home.  Has a PCA 4 hours a day 6 days a week.  Independent with assistive device.  He has a sister in the area.  Presented 12/16/2020 with acute onset of right-sided weakness and speech difficulties.  CT/MRI showed scattered acute infarcts within the left MCA and MCA/PCA watershed territories affecting the cortical/subcortical left frontal parietal and occipital lobe.  Multiple acute infarcts also present within the left insula and basal ganglia.  No significant mass-effect.  No hemorrhagic conversion.  MRA demonstrated moderate stenosis within the proximal M1 and right MCA.  Severe stenosis within the P2 and right PCA.  Patient underwent mechanical thrombectomy per interventional radiology.  Echocardiogram with ejection fraction of 65 to [id number] grade 1 diastolic dysfunction.  EEG negative for seizure.  Admission chemistries glucose 175, creatinine 2.15, hemoglobin 12, hemoglobin A1c 9.7, urine drug screen was positive for marijuana.  Currently maintained on Brilinta for CVA prophylaxis, no aspirin secondary to questionable allergy.  Maintained on Lovenox for DVT prophylaxis.  Therapy evaluations completed due to patient's right side weakness and speech difficulties was admitted for a comprehensive rehab program.   HPI  No recent falls , Left leg pain does not remember when it started , neck pain has been ongoing  Needs help with dressing , Mod I with meal prep, Eats a lot of sweets.  Patient complains of bilateral shoulder pain right greater than left side.  He has tried injections in the past.  He has been told he has arthritis. X-rays from 2023 reviewed, has right acromioclavicular joint  osteoarthritis no osseous findings on the left side.  Cervical spine films from 2021 demonstrate intact instrumented fusion C5-C6.  MRI of the cervical spine from 2018 reviewed as well as CT scan of the cervical spine 2019 reviewed.  Mild adjacent level degenerative changes.  HHPT , [place]   Patient is living in his own apartment in [place] with family supervision.  Pain Inventory Average Pain 7 Pain Right Now 8 My pain is intermittent, sharp, burning, and stabbing  LOCATION OF PAIN  Neck , shoulders   BOWEL Number of stools per week: 5 Oral laxative use Yes  Type of laxative n/a Enema or suppository use No  History of colostomy No  Incontinent Yes   BLADDER diaper In and out cath, frequency n/a Able to self cath  n/a Bladder incontinence Yes  Frequent urination No  Leakage with coughing No  Difficulty starting stream No  Incomplete bladder emptying No    Mobility walk with assistance use a cane use a walker ability to climb steps?  no do you drive?  no use a wheelchair  Function disabled: date disabled    Neuro/Psych bladder control problems bowel control problems weakness numbness tremor trouble walking depression anxiety  Prior Studies N/a  Physicians involved in your care New patient   Family History  Problem Relation Age of Onset   Diabetes Father    Hypertension Father    Sleep apnea Father    Stroke Sister    Arthritis Other    Diabetes Other    Asthma Other    Social History  Socioeconomic History   Marital status: Single    Spouse name: Not on file   Number of children: Not on file   Years of education: 11th grade   Highest education level: Not on file  Occupational History   Occupation: unemployed    Employer: NOT EMPLOYED  Tobacco Use   Smoking status: Former    Packs/day: 0.50    Years: 30.00    Additional pack years: 0.00    Total pack years: 15.00    Types: Cigarettes   Smokeless tobacco: Never   Tobacco  comments:    3 cigs per day  Vaping Use   Vaping Use: Never used  Substance and Sexual Activity   Alcohol use: Not Currently   Drug use: Not Currently    Types: Marijuana   Sexual activity: Not Currently  Other Topics Concern   Not on file  Social History Narrative   09/13/21 Lives alone, caregiver daily, manages meds   Drinks a cup of coffee a day   Social Determinants of Health   Financial Resource Strain: Not on file  Food Insecurity: No Food Insecurity (08/27/2022)   Hunger Vital Sign    Worried About Running Out of Food in the Last Year: Never true    [place] in the Last Year: Never true  Transportation Needs: No Transportation Needs (08/27/2022)   PRAPARE - Hydrologist (Medical): No    Lack of Transportation (Non-Medical): No  Physical Activity: Not on file  Stress: Not on file  Social Connections: Not on file   Past Surgical History:  Procedure Laterality Date   ANTERIOR CERVICAL DECOMP/DISCECTOMY FUSION N/A 12/14/2015   Procedure: ANTERIOR CERVICAL DECOMPRESSION/DISCECTOMY FUSION CERVICAL FIVE -SIX;  Surgeon: Earnie Larsson, MD;  Location: [facility];  Service: Neurosurgery;  Laterality: N/A;   BIOPSY  05/07/2015   Procedure: BIOPSY (Gastric);  Surgeon: [name], MD;  Location: [facility];  Service: Endoscopy;;   CARDIAC CATHETERIZATION N/A 03/07/2016   Procedure: Left Heart Cath and Coronary Angiography;  Surgeon: Belva Crome, MD;  Location: [facility];  Service: Cardiovascular;  Laterality: N/A;   COLONOSCOPY WITH PROPOFOL N/A 05/07/2015   [id number] diverticulosis, multiple colon polyps removed, tubular adenoma, serrated colon polyp. Next colonoscopy October 2019   COLONOSCOPY WITH PROPOFOL N/A 08/02/2018   Procedure: COLONOSCOPY WITH PROPOFOL;  Surgeon: [name], MD;  Location: [facility];  Service: Endoscopy;  Laterality: N/A;  12:00pm   CORONARY ARTERY BYPASS GRAFT N/A 03/28/2016   Procedure: CORONARY ARTERY  BYPASS GRAFTING (CABG) x 5 USING GREATER SAPHENOUS VEIN;  Surgeon: [name], MD;  Location: [facility];  Service: Open Heart Surgery;  Laterality: N/A;   ENDOVEIN HARVEST OF GREATER SAPHENOUS VEIN Right 03/28/2016   Procedure: ENDOVEIN HARVEST OF GREATER SAPHENOUS VEIN;  Surgeon: [name], MD;  Location: [place];  Service: Open Heart Surgery;  Laterality: Right;   ESOPHAGEAL DILATION N/A 05/07/2015   Procedure: ESOPHAGEAL DILATION WITH 56FR MALONEY DILATOR;  Surgeon: [name], MD;  Location: [facility];  Service: Endoscopy;  Laterality: N/A;   ESOPHAGOGASTRODUODENOSCOPY (EGD) WITH PROPOFOL N/A 05/07/2015   RMR: Status post dilation of normal esophagus. Gastritis.   IR CT HEAD LTD  12/17/2020   IR CT HEAD LTD  12/17/2020   IR INTRA CRAN STENT  12/17/2020   IR PERCUTANEOUS ART THROMBECTOMY/INFUSION INTRACRANIAL INC DIAG ANGIO  12/17/2020   IR RADIOLOGIST EVAL & MGMT  03/25/2021  KNEE SURGERY Left    arthroscopy   MANDIBLE FRACTURE SURGERY     POLYPECTOMY  05/07/2015   Procedure: POLYPECTOMY (Hepatic Flexure, Distal Transverse Colon, Rectal);  Surgeon: [name], MD;  [facility];  Service: Endoscopy;;   RADIOLOGY WITH ANESTHESIA N/A 12/16/2020   Procedure: IR WITH ANESTHESIA;  Surgeon: Luanne Bras, MD;  Location: [place];  Service: Radiology;  Laterality: N/A;   TEE WITHOUT CARDIOVERSION N/A 03/28/2016   Procedure: TRANSESOPHAGEAL ECHOCARDIOGRAM (TEE);  Surgeon: [name], MD;  Location: [facility];  Service: Open Heart Surgery;  Laterality: N/A;   TONSILLECTOMY     TRIGGER FINGER RELEASE Left 10/15/2021   Procedure: RELEASE TRIGGER FINGER/A-1 PULLEY left middle or long finger;  Surgeon: Carole Civil, MD;  Location: [facility];  Service: Orthopedics;  Laterality: Left;   Past Medical History:  Diagnosis Date   Bipolar 1 disorder ([place])    depression/anxiety   Bone spur    left heel   Cervical radiculopathy    Chronic back pain    Chronic chest wall pain    Chronic neck  pain    Coronary artery disease    a. s/p CABG in 03/2016 with LIMA-LAD, SVG-D1, SVG-RCA, and Seq SVG-mid and distal OM   Diabetes mellitus    Type II   Hallucinations    "long history of them"   Headache(784.0)    History of gout    HTN (hypertension)    Insomnia    Neuropathy    Pain management    Polysubstance abuse ([place])    Right leg pain    chronic   Schizophrenia ([place])    Seizures ([place])    last sz between July 5-9th, 2016; epilepsy   Sleep apnea    Stroke [facility])    Transfusion of blood product refused for religious reason    There were no vitals taken for this visit.  Opioid Risk Score:   Fall Risk Score:  `1  Depression screen PHQ 2/9     03/10/2021    5:02 PM 01/07/2021   12:02 PM 12/21/2016    9:52 AM 04/26/2016    9:55 AM 04/05/2016   11:11 AM 03/11/2016    9:53 AM 07/23/2015    9:04 AM  Depression screen PHQ 2/9  Decreased Interest 0 0 0 0 0 0 0  Down, Depressed, Hopeless 0 0 0 0 0 0 0  PHQ - 2 Score 0 0 0 0 0 0 0  Altered sleeping  0       Tired, decreased energy  0       Change in appetite  0       Feeling bad or failure about yourself   0       Trouble concentrating  2       Moving slowly or fidgety/restless  2       Suicidal thoughts  0       PHQ-9 Score  4         Review of Systems  Musculoskeletal:  Positive for back pain and neck pain.       Rt arm, leg pain  All other systems reviewed and are negative.      Objective:   Physical Exam Vitals and nursing note reviewed.  Constitutional:      Appearance: He is normal weight.  HENT:     Head: Normocephalic and atraumatic.  Eyes:     Extraocular Movements: Extraocular movements intact.  Conjunctiva/sclera: Conjunctivae normal.     Pupils: Pupils are equal, round, and reactive to light.  Neurological:     Mental Status: He is alert and oriented to person, place, and time.     Cranial Nerves: Dysarthria present.     Motor: No abnormal muscle tone.     Coordination: Coordination abnormal.      Gait: Gait abnormal.     Comments: Reduced fine motor right upper extremity finger to thumb opposition Motor strength right upper extremity 3 - at the deltoid bicep tricep 4 - at the finger flexors and extensors 5/5 strength in left upper extremity Right lower extremity 4/5 hip flexor knee extensor ankle dorsiflexor left lower extremity 5/5 hip flexor knee extensor ankle dorsiflexor Ambulates with a quad cane no evidence of toe drag or knee instability. Speech with some word finding issues can communicate at a phrase level. Poor historian, family member needs to supplement basic medical history  Psychiatric:        Mood and Affect: Mood normal.        Behavior: Behavior normal.           Assessment & Plan:   1.  History of left MCA infarct in 2022 with more recent SDH overall is back to his poststroke functional level.  His stroke mainly produced expressive aphasia and right upper extremity greater than lower extremity weakness. He will complete his home health PT OT.  Follow-up with neurology as well as his PCP and his nephrologist.  Discussed with patient and caregiver. Physical medicine rehab follow-up on as-needed basis.  2.  Left shoulder pain likely combination of poststroke weakness with cranial clavicular osteoarthritis.  We discussed shoulder injection which she would not like to pursue at the current time.

## 2022-11-03 ENCOUNTER — Other Ambulatory Visit: Payer: Self-pay | Admitting: Adult Health

## 2022-11-03 ENCOUNTER — Other Ambulatory Visit: Payer: Self-pay

## 2022-11-03 NOTE — Telephone Encounter (Signed)
[name] called stating that the pt is needing a refill on his lamoTRIgine (LAMICTAL) 25 MG tablet sent to the [facility]

## 2022-11-10 ENCOUNTER — Encounter (HOSPITAL_COMMUNITY)
Admission: RE | Admit: 2022-11-10 | Discharge: 2022-11-10 | Disposition: A | Discharge status: Home / Self Care | Payer: Medicaid Other | Source: Ambulatory Visit | Attending: Nephrology | Admitting: Nephrology

## 2022-11-10 VITALS — BP 105/75 | HR 86 | Temp 98.7°F | Resp 16

## 2022-11-10 DIAGNOSIS — D631 Anemia in chronic kidney disease: Secondary | ICD-10-CM | POA: Insufficient documentation

## 2022-11-10 DIAGNOSIS — N1832 Chronic kidney disease, stage 3b: Secondary | ICD-10-CM | POA: Insufficient documentation

## 2022-11-10 DIAGNOSIS — E43 Unspecified severe protein-calorie malnutrition: Secondary | ICD-10-CM | POA: Insufficient documentation

## 2022-11-10 LAB — RENAL FUNCTION PANEL
Albumin: 3.3 g/dL — ABNORMAL LOW (ref 3.5–5.0)
Anion gap: 6 (ref 5–15)
BUN: 20 mg/dL (ref 6–20)
CO2: 25 mmol/L (ref 22–32)
Calcium: 8.5 mg/dL — ABNORMAL LOW (ref 8.9–10.3)
Chloride: 103 mmol/L (ref 98–111)
Creatinine, Ser: 1.8 mg/dL — ABNORMAL HIGH (ref 0.61–1.24)
GFR, Estimated: 43 mL/min — ABNORMAL LOW (ref >60)
Glucose, Bld: 187 mg/dL — ABNORMAL HIGH (ref 70–99)
Phosphorus: 3.9 mg/dL (ref 2.5–4.6)
Potassium: 4.5 mmol/L (ref 3.5–5.1)
Sodium: 134 mmol/L — ABNORMAL LOW (ref 135–145)

## 2022-11-10 LAB — CBC WITH DIFFERENTIAL/PLATELET
Abs Immature Granulocytes: 0.01 10*3/uL (ref 0.00–0.07)
Basophils Absolute: 0 10*3/uL (ref 0.0–0.1)
Basophils Relative: 0 %
Eosinophils Absolute: 0.1 10*3/uL (ref 0.0–0.5)
Eosinophils Relative: 2 %
HCT: 29.2 % — ABNORMAL LOW (ref 39.0–52.0)
Hemoglobin: 9.5 g/dL — ABNORMAL LOW (ref 13.0–17.0)
Immature Granulocytes: 0 %
Lymphocytes Relative: 62 %
Lymphs Abs: 2.8 10*3/uL (ref 0.7–4.0)
MCH: 29.8 pg (ref 26.0–34.0)
MCHC: 32.5 g/dL (ref 30.0–36.0)
MCV: 91.5 fL (ref 80.0–100.0)
Monocytes Absolute: 0.6 10*3/uL (ref 0.1–1.0)
Monocytes Relative: 12 %
Neutro Abs: 1.1 10*3/uL — ABNORMAL LOW (ref 1.7–7.7)
Neutrophils Relative %: 24 %
Platelets: 141 10*3/uL — ABNORMAL LOW (ref 150–400)
RBC: 3.19 MIL/uL — ABNORMAL LOW (ref 4.22–5.81)
RDW: 16.3 % — ABNORMAL HIGH (ref 11.5–15.5)
WBC: 4.6 10*3/uL (ref 4.0–10.5)
nRBC: 0 % (ref 0.0–0.2)

## 2022-11-10 LAB — IRON AND TIBC
Iron: 69 ug/dL (ref 45–182)
Saturation Ratios: 25 % (ref 17.9–39.5)
TIBC: 274 ug/dL (ref 250–450)
UIBC: 205 ug/dL

## 2022-11-10 LAB — FERRITIN: Ferritin: 70 ng/mL (ref 24–336)

## 2022-11-10 LAB — POCT HEMOGLOBIN-HEMACUE: Hemoglobin: 8.5 g/dL — ABNORMAL LOW (ref 13.0–17.0)

## 2022-11-10 MED ORDER — EPOETIN ALFA 10000 UNIT/ML IJ SOLN
5000.0000 [IU] | Freq: Once | INTRAMUSCULAR | Status: AC
  Administered 2022-11-10: 5000 [IU] via SUBCUTANEOUS
  Filled 2022-11-10: qty 1

## 2022-11-10 NOTE — Progress Notes (Signed)
Diagnosis: Anemia in Chronic Kidney Disease  Provider:  Manpreet Bhutani MD  Procedure: Injection  Procrit (epoetin alfa), Dose: 5000 units , Site: subcutaneous, Number of injections: 1  Hgb 8.5  Post Care: Patient declined observation  Discharge: Condition: Good, Destination: Home . AVS Declined  Performed by:  Evelena Peat, RN

## 2022-11-11 ENCOUNTER — Encounter: Payer: Self-pay | Admitting: Diagnostic Neuroimaging

## 2022-11-11 ENCOUNTER — Ambulatory Visit: Payer: Medicaid Other | Admitting: Diagnostic Neuroimaging

## 2022-11-11 VITALS — BP 101/74 | HR 90 | Ht 67.0 in | Wt 120.6 lb

## 2022-11-11 DIAGNOSIS — I63512 Cerebral infarction due to unspecified occlusion or stenosis of left middle cerebral artery: Secondary | ICD-10-CM

## 2022-11-11 DIAGNOSIS — G40909 Epilepsy, unspecified, not intractable, without status epilepticus: Secondary | ICD-10-CM | POA: Diagnosis not present

## 2022-11-11 LAB — PARATHYROID HORMONE, INTACT (NO CA): PTH: 31 pg/mL (ref 15–65)
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
## 2022-11-11 NOTE — Patient Instructions (Signed)
  STROKE PREVENTION - continue brillinta 90mg  twice a day  - continue atorvastatin 80mg  daily - continue BP control  SEIZURE DISORDER - continue divalproex 500mg  twice a day - continue lamotrigine 50mg  twice a day

## 2022-11-23 ENCOUNTER — Encounter: Payer: Self-pay | Admitting: Diagnostic Neuroimaging

## 2022-11-23 ENCOUNTER — Telehealth: Payer: Self-pay | Admitting: Diagnostic Neuroimaging

## 2022-11-23 NOTE — Telephone Encounter (Signed)
Sent letter in mail informing pt of appointment change from 7/11 to 7/8 due to provider template change

## 2022-11-24 ENCOUNTER — Encounter (HOSPITAL_COMMUNITY)
Admission: RE | Admit: 2022-11-24 | Discharge: 2022-11-24 | Disposition: A | Discharge status: Home / Self Care | Payer: Medicaid Other | Source: Ambulatory Visit | Attending: Nephrology | Admitting: Nephrology

## 2022-11-24 VITALS — BP 108/72 | HR 53 | Temp 97.7°F | Resp 20

## 2022-11-24 DIAGNOSIS — D631 Anemia in chronic kidney disease: Secondary | ICD-10-CM

## 2022-11-24 DIAGNOSIS — N1832 Chronic kidney disease, stage 3b: Secondary | ICD-10-CM | POA: Diagnosis not present

## 2022-11-24 DIAGNOSIS — E43 Unspecified severe protein-calorie malnutrition: Secondary | ICD-10-CM

## 2022-11-24 LAB — PROTEIN / CREATININE RATIO, URINE
Creatinine, Urine: 122 mg/dL
Protein Creatinine Ratio: 0.1 mg/mg{Cre} (ref 0.00–0.15)
Total Protein, Urine: 12 mg/dL

## 2022-11-24 LAB — POCT HEMOGLOBIN-HEMACUE: Hemoglobin: 8.9 g/dL — ABNORMAL LOW (ref 13.0–17.0)

## 2022-11-24 MED ORDER — EPOETIN ALFA 10000 UNIT/ML IJ SOLN
5000.0000 [IU] | Freq: Once | INTRAMUSCULAR | Status: AC
  Administered 2022-11-24: 5000 [IU] via SUBCUTANEOUS

## 2022-11-24 NOTE — Progress Notes (Signed)
Diagnosis: Anemia in Chronic Kidney Disease  Provider:  Manpreet Bhutani MD  Procedure: Injection  Procrit (epoetin alfa), Dose: 5000 units , Site: subcutaneous, Number of injections: 1  Hgb 8.9. Administered in left arm.  Post Care: Patient declined observation  Discharge: Condition: Good, Destination: Nursing Home . AVS Declined  Performed by:  Wyvonne Lenz, RN

## 2022-11-24 NOTE — Addendum Note (Signed)
Encounter addended by: Wyvonne Lenz, RN on: 11/24/2022 9:18 AM  Actions taken: Therapy plan modified

## 2022-11-29 ENCOUNTER — Other Ambulatory Visit: Payer: Self-pay | Admitting: Adult Health

## 2022-11-30 ENCOUNTER — Other Ambulatory Visit: Payer: Self-pay

## 2022-12-08 ENCOUNTER — Encounter (HOSPITAL_COMMUNITY)
Admission: RE | Admit: 2022-12-08 | Discharge: 2022-12-08 | Disposition: A | Discharge status: Home / Self Care | Payer: Medicaid Other | Source: Ambulatory Visit | Attending: Nephrology | Admitting: Nephrology

## 2022-12-08 VITALS — BP 103/67 | HR 71 | Temp 98.5°F | Resp 18

## 2022-12-08 DIAGNOSIS — N1832 Chronic kidney disease, stage 3b: Secondary | ICD-10-CM

## 2022-12-08 DIAGNOSIS — D631 Anemia in chronic kidney disease: Secondary | ICD-10-CM | POA: Insufficient documentation

## 2022-12-08 DIAGNOSIS — D638 Anemia in other chronic diseases classified elsewhere: Secondary | ICD-10-CM | POA: Diagnosis present

## 2022-12-08 LAB — POCT HEMOGLOBIN-HEMACUE: Hemoglobin: 9.1 g/dL — ABNORMAL LOW (ref 13.0–17.0)

## 2022-12-08 MED ORDER — EPOETIN ALFA 10000 UNIT/ML IJ SOLN
5000.0000 [IU] | Freq: Once | INTRAMUSCULAR | Status: AC
  Administered 2022-12-08: 5000 [IU] via SUBCUTANEOUS

## 2022-12-08 NOTE — Addendum Note (Signed)
Encounter addended by: Pilkington-Burchett, Marlow Baars, RN on: 12/08/2022 9:15 AM  Actions taken: MAR administration edited, MAR administration accepted

## 2022-12-08 NOTE — Progress Notes (Signed)
Diagnosis: Anemia in Chronic Kidney Disease  Provider:  Manpreet Bhutani MD  Procedure: Injection  Procrit (epoetin alfa), Dose: 5000 units , Site: subcutaneous, Number of injections: 1  Hgb 9.1. Administered in left arm.  Post Care: Patient declined observation  Discharge: Condition: Good, Destination: Nursing Home . AVS Declined  Performed by:  Marin Shutter, RN

## 2022-12-12 ENCOUNTER — Other Ambulatory Visit: Payer: Self-pay

## 2022-12-14 ENCOUNTER — Telehealth (HOSPITAL_COMMUNITY): Payer: Self-pay

## 2022-12-14 ENCOUNTER — Telehealth: Payer: Self-pay | Admitting: Pharmacy Technician

## 2022-12-14 NOTE — Telephone Encounter (Signed)
Auth Submission: NO AUTH NEEDED Site of care: Site of care: [facility] Payer: [place] medicaid Medication & CPT/J Code(s) submitted: Feraheme (ferumoxytol) [id number] Route of submission (phone, fax, portal):  Phone # Fax # Auth type: Buy/Bill Units/visits requested: x2 Reference number:  Approval from: 12/14/22 to 04/16/23

## 2022-12-14 NOTE — Telephone Encounter (Signed)
Called sister [name] Misty Stanley) [place] to schedule iron infusion (ok per DPR). No answer. Left HIPAA-compliant voicemail requesting call back. Will also send MyChart message.  Wyvonne Lenz, RN

## 2022-12-15 ENCOUNTER — Ambulatory Visit (INDEPENDENT_AMBULATORY_CARE_PROVIDER_SITE_OTHER): Payer: Medicaid Other | Admitting: Podiatry

## 2022-12-15 ENCOUNTER — Telehealth: Payer: Self-pay

## 2022-12-15 ENCOUNTER — Encounter: Payer: Self-pay | Admitting: Podiatry

## 2022-12-15 DIAGNOSIS — M79674 Pain in right toe(s): Secondary | ICD-10-CM

## 2022-12-15 DIAGNOSIS — M79675 Pain in left toe(s): Secondary | ICD-10-CM

## 2022-12-15 DIAGNOSIS — E1142 Type 2 diabetes mellitus with diabetic polyneuropathy: Secondary | ICD-10-CM

## 2022-12-15 DIAGNOSIS — B351 Tinea unguium: Secondary | ICD-10-CM

## 2022-12-15 NOTE — Telephone Encounter (Signed)
Error

## 2022-12-15 NOTE — Telephone Encounter (Addendum)
Spoke with sister [name] [phone]). Confirmed Procrit injection appointments on 12/22/22 and 01/05/23, and made additional appointments through 03/02/23. Ms. Melvin Taylor had questions regarding IV iron infusions ordered by Dr. Wolfgang Phoenix; directed her to call provider's office for additional information. Verbalized understanding and stated she would call back to schedule infusions.  Wyvonne Lenz, RN    Addendum:  Spoke with Ms. [name] this afternoon. She confirmed her questions were answered by provider and ok to schedule. Requested I call patient's caregiver [name] to schedule [phone]); this RN completed this afternoon. Ms. Melvin Taylor is aware of dates of upcoming iron infusion appointments. All questions answered.  Wyvonne Lenz, RN

## 2022-12-15 NOTE — Progress Notes (Signed)
This patient returns to my office for at risk foot care.  This patient requires this care by a professional since this patient will be at risk due to having diabetic neuropathy.  This patient is unable to cut nails himself since the patient cannot reach his nails.These nails are painful walking and wearing shoes.  This patient presents for at risk foot care today.  General Appearance  Alert, conversant and in no acute stress.  Vascular  Dorsalis pedis and posterior tibial  pulses are palpable  bilaterally.  Capillary return is within normal limits  bilaterally. Temperature is within normal limits  bilaterally.  Neurologic  Senn-Weinstein monofilament wire test within normal limits  bilaterally. Muscle power within normal limits bilaterally.  Nails Thick disfigured discolored nails with subungual debris  from hallux to fifth toes bilaterally. No evidence of bacterial infection or drainage bilaterally.  Orthopedic  No limitations of motion  feet .  No crepitus or effusions noted.  No bony pathology or digital deformities noted.  Skin  normotropic skin with no porokeratosis noted bilaterally.  No signs of infections or ulcers noted.     Onychomycosis  Pain in right toes  Pain in left toes  Consent was obtained for treatment procedures.   Mechanical debridement of nails 1-5  bilaterally performed with a nail nipper.  Filed with dremel without incident.    Return office visit   4 months                   Told patient to return for periodic foot care and evaluation due to potential at risk complications.   Tarig Zimmers DPM   

## 2022-12-16 ENCOUNTER — Other Ambulatory Visit: Payer: Self-pay

## 2022-12-16 ENCOUNTER — Encounter (HOSPITAL_COMMUNITY)
Admission: RE | Admit: 2022-12-16 | Discharge: 2022-12-16 | Disposition: A | Discharge status: Home / Self Care | Payer: Medicaid Other | Source: Ambulatory Visit | Attending: Nephrology | Admitting: Nephrology

## 2022-12-16 ENCOUNTER — Ambulatory Visit: Payer: Medicaid Other | Admitting: Podiatry

## 2022-12-16 ENCOUNTER — Telehealth (HOSPITAL_COMMUNITY): Payer: Self-pay

## 2022-12-16 ENCOUNTER — Encounter (HOSPITAL_COMMUNITY): Payer: Self-pay | Admitting: Emergency Medicine

## 2022-12-16 ENCOUNTER — Emergency Department (HOSPITAL_COMMUNITY): Payer: Medicaid Other

## 2022-12-16 ENCOUNTER — Inpatient Hospital Stay (HOSPITAL_COMMUNITY)
Admission: EM | Admit: 2022-12-16 | Discharge: 2022-12-21 | DRG: 640 | Disposition: A | Discharge status: Home Health | Payer: Medicaid Other | Attending: Family Medicine | Admitting: Family Medicine

## 2022-12-16 VITALS — BP 75/53 | HR 66 | Temp 98.0°F | Resp 18

## 2022-12-16 DIAGNOSIS — I951 Orthostatic hypotension: Secondary | ICD-10-CM | POA: Diagnosis not present

## 2022-12-16 DIAGNOSIS — G4730 Sleep apnea, unspecified: Secondary | ICD-10-CM | POA: Diagnosis present

## 2022-12-16 DIAGNOSIS — F209 Schizophrenia, unspecified: Secondary | ICD-10-CM | POA: Diagnosis present

## 2022-12-16 DIAGNOSIS — R32 Unspecified urinary incontinence: Secondary | ICD-10-CM | POA: Diagnosis present

## 2022-12-16 DIAGNOSIS — E1159 Type 2 diabetes mellitus with other circulatory complications: Secondary | ICD-10-CM | POA: Diagnosis not present

## 2022-12-16 DIAGNOSIS — Z7902 Long term (current) use of antithrombotics/antiplatelets: Secondary | ICD-10-CM

## 2022-12-16 DIAGNOSIS — I959 Hypotension, unspecified: Secondary | ICD-10-CM

## 2022-12-16 DIAGNOSIS — E11649 Type 2 diabetes mellitus with hypoglycemia without coma: Secondary | ICD-10-CM | POA: Diagnosis present

## 2022-12-16 DIAGNOSIS — E1122 Type 2 diabetes mellitus with diabetic chronic kidney disease: Secondary | ICD-10-CM | POA: Diagnosis present

## 2022-12-16 DIAGNOSIS — I2510 Atherosclerotic heart disease of native coronary artery without angina pectoris: Secondary | ICD-10-CM | POA: Diagnosis present

## 2022-12-16 DIAGNOSIS — R569 Unspecified convulsions: Secondary | ICD-10-CM | POA: Diagnosis not present

## 2022-12-16 DIAGNOSIS — E860 Dehydration: Secondary | ICD-10-CM | POA: Diagnosis present

## 2022-12-16 DIAGNOSIS — E869 Volume depletion, unspecified: Secondary | ICD-10-CM | POA: Diagnosis present

## 2022-12-16 DIAGNOSIS — Z681 Body mass index (BMI) 19 or less, adult: Secondary | ICD-10-CM

## 2022-12-16 DIAGNOSIS — F419 Anxiety disorder, unspecified: Secondary | ICD-10-CM | POA: Diagnosis present

## 2022-12-16 DIAGNOSIS — N179 Acute kidney failure, unspecified: Secondary | ICD-10-CM | POA: Diagnosis present

## 2022-12-16 DIAGNOSIS — N289 Disorder of kidney and ureter, unspecified: Secondary | ICD-10-CM | POA: Diagnosis not present

## 2022-12-16 DIAGNOSIS — Z951 Presence of aortocoronary bypass graft: Secondary | ICD-10-CM

## 2022-12-16 DIAGNOSIS — N1832 Chronic kidney disease, stage 3b: Secondary | ICD-10-CM | POA: Diagnosis present

## 2022-12-16 DIAGNOSIS — D509 Iron deficiency anemia, unspecified: Secondary | ICD-10-CM | POA: Diagnosis present

## 2022-12-16 DIAGNOSIS — F32A Depression, unspecified: Secondary | ICD-10-CM | POA: Diagnosis present

## 2022-12-16 DIAGNOSIS — G8929 Other chronic pain: Secondary | ICD-10-CM | POA: Diagnosis present

## 2022-12-16 DIAGNOSIS — D631 Anemia in chronic kidney disease: Secondary | ICD-10-CM | POA: Diagnosis not present

## 2022-12-16 DIAGNOSIS — Z886 Allergy status to analgesic agent status: Secondary | ICD-10-CM

## 2022-12-16 DIAGNOSIS — F319 Bipolar disorder, unspecified: Secondary | ICD-10-CM | POA: Diagnosis present

## 2022-12-16 DIAGNOSIS — G40909 Epilepsy, unspecified, not intractable, without status epilepticus: Secondary | ICD-10-CM

## 2022-12-16 DIAGNOSIS — E43 Unspecified severe protein-calorie malnutrition: Secondary | ICD-10-CM | POA: Diagnosis not present

## 2022-12-16 DIAGNOSIS — E1140 Type 2 diabetes mellitus with diabetic neuropathy, unspecified: Secondary | ICD-10-CM | POA: Diagnosis present

## 2022-12-16 DIAGNOSIS — Z87891 Personal history of nicotine dependence: Secondary | ICD-10-CM | POA: Diagnosis not present

## 2022-12-16 DIAGNOSIS — I129 Hypertensive chronic kidney disease with stage 1 through stage 4 chronic kidney disease, or unspecified chronic kidney disease: Secondary | ICD-10-CM | POA: Diagnosis present

## 2022-12-16 DIAGNOSIS — Z79899 Other long term (current) drug therapy: Secondary | ICD-10-CM

## 2022-12-16 DIAGNOSIS — E119 Type 2 diabetes mellitus without complications: Secondary | ICD-10-CM

## 2022-12-16 DIAGNOSIS — Z981 Arthrodesis status: Secondary | ICD-10-CM

## 2022-12-16 DIAGNOSIS — R4182 Altered mental status, unspecified: Secondary | ICD-10-CM | POA: Diagnosis not present

## 2022-12-16 DIAGNOSIS — Z8249 Family history of ischemic heart disease and other diseases of the circulatory system: Secondary | ICD-10-CM | POA: Diagnosis not present

## 2022-12-16 DIAGNOSIS — Z820 Family history of epilepsy and other diseases of the nervous system: Secondary | ICD-10-CM

## 2022-12-16 DIAGNOSIS — I639 Cerebral infarction, unspecified: Secondary | ICD-10-CM | POA: Diagnosis not present

## 2022-12-16 DIAGNOSIS — N1831 Chronic kidney disease, stage 3a: Secondary | ICD-10-CM | POA: Diagnosis present

## 2022-12-16 DIAGNOSIS — D638 Anemia in other chronic diseases classified elsewhere: Secondary | ICD-10-CM

## 2022-12-16 DIAGNOSIS — G9341 Metabolic encephalopathy: Secondary | ICD-10-CM | POA: Diagnosis not present

## 2022-12-16 DIAGNOSIS — I69351 Hemiplegia and hemiparesis following cerebral infarction affecting right dominant side: Secondary | ICD-10-CM | POA: Diagnosis not present

## 2022-12-16 DIAGNOSIS — M109 Gout, unspecified: Secondary | ICD-10-CM | POA: Diagnosis present

## 2022-12-16 DIAGNOSIS — H9190 Unspecified hearing loss, unspecified ear: Secondary | ICD-10-CM | POA: Diagnosis present

## 2022-12-16 DIAGNOSIS — G4700 Insomnia, unspecified: Secondary | ICD-10-CM | POA: Diagnosis present

## 2022-12-16 DIAGNOSIS — I9589 Other hypotension: Secondary | ICD-10-CM | POA: Diagnosis not present

## 2022-12-16 DIAGNOSIS — Z823 Family history of stroke: Secondary | ICD-10-CM

## 2022-12-16 DIAGNOSIS — Z86010 Personal history of colonic polyps: Secondary | ICD-10-CM

## 2022-12-16 DIAGNOSIS — Z833 Family history of diabetes mellitus: Secondary | ICD-10-CM

## 2022-12-16 DIAGNOSIS — Z825 Family history of asthma and other chronic lower respiratory diseases: Secondary | ICD-10-CM

## 2022-12-16 HISTORY — DX: Hypotension, unspecified: I95.9

## 2022-12-16 LAB — COMPREHENSIVE METABOLIC PANEL
ALT: 13 U/L (ref 0–44)
AST: 24 U/L (ref 15–41)
Albumin: 3.2 g/dL — ABNORMAL LOW (ref 3.5–5.0)
Alkaline Phosphatase: 45 U/L (ref 38–126)
Anion gap: 11 (ref 5–15)
BUN: 28 mg/dL — ABNORMAL HIGH (ref 6–20)
CO2: 23 mmol/L (ref 22–32)
Calcium: 8.5 mg/dL — ABNORMAL LOW (ref 8.9–10.3)
Chloride: 100 mmol/L (ref 98–111)
Creatinine, Ser: 2.41 mg/dL — ABNORMAL HIGH (ref 0.61–1.24)
GFR, Estimated: 30 mL/min — ABNORMAL LOW (ref >60)
Glucose, Bld: 137 mg/dL — ABNORMAL HIGH (ref 70–99)
Potassium: 4.2 mmol/L (ref 3.5–5.1)
Sodium: 134 mmol/L — ABNORMAL LOW (ref 135–145)
Total Bilirubin: 0.6 mg/dL (ref 0.3–1.2)
Total Protein: 6.1 g/dL — ABNORMAL LOW (ref 6.5–8.1)

## 2022-12-16 LAB — CBC WITH DIFFERENTIAL/PLATELET
Abs Immature Granulocytes: 0.03 10*3/uL (ref 0.00–0.07)
Basophils Absolute: 0 10*3/uL (ref 0.0–0.1)
Basophils Relative: 0 %
Eosinophils Absolute: 0.1 10*3/uL (ref 0.0–0.5)
Eosinophils Relative: 2 %
HCT: 26.6 % — ABNORMAL LOW (ref 39.0–52.0)
Hemoglobin: 8.7 g/dL — ABNORMAL LOW (ref 13.0–17.0)
Immature Granulocytes: 1 %
Lymphocytes Relative: 55 %
Lymphs Abs: 3.2 10*3/uL (ref 0.7–4.0)
MCH: 30.9 pg (ref 26.0–34.0)
MCHC: 32.7 g/dL (ref 30.0–36.0)
MCV: 94.3 fL (ref 80.0–100.0)
Monocytes Absolute: 0.8 10*3/uL (ref 0.1–1.0)
Monocytes Relative: 14 %
Neutro Abs: 1.6 10*3/uL — ABNORMAL LOW (ref 1.7–7.7)
Neutrophils Relative %: 28 %
Platelets: 274 10*3/uL (ref 150–400)
RBC: 2.82 MIL/uL — ABNORMAL LOW (ref 4.22–5.81)
RDW: 17.1 % — ABNORMAL HIGH (ref 11.5–15.5)
WBC: 5.7 10*3/uL (ref 4.0–10.5)
nRBC: 0 % (ref 0.0–0.2)

## 2022-12-16 LAB — URINALYSIS, W/ REFLEX TO CULTURE (INFECTION SUSPECTED)
Bacteria, UA: NONE SEEN
Bilirubin Urine: NEGATIVE
Glucose, UA: NEGATIVE mg/dL
Hgb urine dipstick: NEGATIVE
Ketones, ur: NEGATIVE mg/dL
Leukocytes,Ua: NEGATIVE
Nitrite: NEGATIVE
Protein, ur: NEGATIVE mg/dL
Specific Gravity, Urine: 1.004 — ABNORMAL LOW (ref 1.005–1.030)
pH: 6 (ref 5.0–8.0)

## 2022-12-16 LAB — CULTURE, BLOOD (ROUTINE X 2): Special Requests: ADEQUATE

## 2022-12-16 LAB — LACTIC ACID, PLASMA
Lactic Acid, Venous: 2.9 mmol/L (ref 0.5–1.9)
Lactic Acid, Venous: 0.6 mmol/L (ref 0.5–1.9)

## 2022-12-16 MED ORDER — SODIUM CHLORIDE 0.9 % IV SOLN
510.0000 mg | Freq: Once | INTRAVENOUS | Status: DC
  Filled 2022-12-16: qty 17

## 2022-12-16 MED ORDER — ONDANSETRON HCL 4 MG/2ML IJ SOLN: 4.0000 mg | Freq: Four times a day (QID) | INTRAMUSCULAR | Status: DC | PRN

## 2022-12-16 MED ORDER — ALBUTEROL SULFATE (2.5 MG/3ML) 0.083% IN NEBU: 3.0000 mL | INHALATION_SOLUTION | RESPIRATORY_TRACT | Status: DC | PRN

## 2022-12-16 MED ORDER — ONDANSETRON HCL 4 MG PO TABS: 4.0000 mg | ORAL_TABLET | Freq: Four times a day (QID) | ORAL | Status: DC | PRN

## 2022-12-16 MED ORDER — POLYETHYLENE GLYCOL 3350 17 G PO PACK: 17.0000 g | PACK | Freq: Every day | ORAL | Status: DC | PRN

## 2022-12-16 MED ORDER — ACETAMINOPHEN 650 MG RE SUPP: 650.0000 mg | Freq: Four times a day (QID) | RECTAL | Status: DC | PRN

## 2022-12-16 MED ORDER — LACTATED RINGERS IV SOLN: INTRAVENOUS | Status: DC

## 2022-12-16 MED ORDER — ACETAMINOPHEN 325 MG PO TABS
650.0000 mg | ORAL_TABLET | Freq: Four times a day (QID) | ORAL | Status: DC | PRN
  Administered 2022-12-18 – 2022-12-20 (×3): 650 mg via ORAL
  Filled 2022-12-16 (×3): qty 2

## 2022-12-16 MED ORDER — ACETAMINOPHEN 325 MG PO TABS
650.0000 mg | ORAL_TABLET | Freq: Once | ORAL | Status: DC
  Filled 2022-12-16: qty 2

## 2022-12-16 MED ORDER — ATORVASTATIN CALCIUM 40 MG PO TABS
80.0000 mg | ORAL_TABLET | Freq: Every day | ORAL | Status: DC
  Administered 2022-12-16 – 2022-12-20 (×5): 80 mg via ORAL
  Filled 2022-12-16 (×5): qty 2

## 2022-12-16 MED ORDER — CYCLOSPORINE 0.05 % OP EMUL
1.0000 [drp] | Freq: Two times a day (BID) | OPHTHALMIC | Status: DC
  Administered 2022-12-16 – 2022-12-21 (×10): 1 [drp] via OPHTHALMIC
  Filled 2022-12-16 (×8): qty 30

## 2022-12-16 MED ORDER — LAMOTRIGINE 25 MG PO TABS
50.0000 mg | ORAL_TABLET | Freq: Two times a day (BID) | ORAL | Status: DC
  Administered 2022-12-16 – 2022-12-21 (×10): 50 mg via ORAL
  Filled 2022-12-16 (×10): qty 2

## 2022-12-16 MED ORDER — SODIUM CHLORIDE 0.9 % IV BOLUS
2000.0000 mL | Freq: Once | INTRAVENOUS | Status: AC
  Administered 2022-12-16: 2000 mL via INTRAVENOUS

## 2022-12-16 MED ORDER — TICAGRELOR 90 MG PO TABS
90.0000 mg | ORAL_TABLET | Freq: Two times a day (BID) | ORAL | Status: DC
  Administered 2022-12-16 – 2022-12-21 (×10): 90 mg via ORAL
  Filled 2022-12-16 (×10): qty 1

## 2022-12-16 MED ORDER — SODIUM CHLORIDE 0.9 % IV SOLN
300.0000 mg | Freq: Once | INTRAVENOUS | Status: AC
  Administered 2022-12-16: 300 mg via INTRAVENOUS
  Filled 2022-12-16: qty 15

## 2022-12-16 MED ORDER — DIPHENHYDRAMINE HCL 25 MG PO CAPS
25.0000 mg | ORAL_CAPSULE | Freq: Once | ORAL | Status: DC
  Filled 2022-12-16: qty 1

## 2022-12-16 MED ORDER — ESCITALOPRAM OXALATE 10 MG PO TABS
20.0000 mg | ORAL_TABLET | Freq: Every day | ORAL | Status: DC
  Administered 2022-12-17 – 2022-12-21 (×5): 20 mg via ORAL
  Filled 2022-12-16 (×5): qty 2

## 2022-12-16 MED ORDER — PANTOPRAZOLE SODIUM 40 MG PO TBEC
40.0000 mg | DELAYED_RELEASE_TABLET | Freq: Every day | ORAL | Status: DC
  Administered 2022-12-17 – 2022-12-21 (×5): 40 mg via ORAL
  Filled 2022-12-16 (×5): qty 1

## 2022-12-16 MED ORDER — ENSURE ENLIVE PO LIQD
237.0000 mL | Freq: Two times a day (BID) | ORAL | Status: DC
  Administered 2022-12-17 – 2022-12-21 (×9): 237 mL via ORAL

## 2022-12-16 MED ORDER — ENOXAPARIN SODIUM 30 MG/0.3ML IJ SOSY
30.0000 mg | PREFILLED_SYRINGE | INTRAMUSCULAR | Status: DC
  Administered 2022-12-16 – 2022-12-17 (×2): 30 mg via SUBCUTANEOUS
  Filled 2022-12-16 (×2): qty 0.3

## 2022-12-16 MED ORDER — DIVALPROEX SODIUM 250 MG PO DR TAB
500.0000 mg | DELAYED_RELEASE_TABLET | Freq: Every day | ORAL | Status: DC
  Administered 2022-12-17 – 2022-12-18 (×2): 500 mg via ORAL
  Filled 2022-12-16 (×2): qty 2

## 2022-12-16 NOTE — Progress Notes (Addendum)
Mostly nonverbal except able to give short answers to questions.  Spoke with Perrin Smack, longtime caregiver with [facility] and he is with patient in home 10 hours daily, with uncle being here the remaining hours.  Baseline he usually walks independently but has recently been using walker, esp since stroke.  Needs assist with bathing but able to feed self.  Tremors are baseline. No teeth but [name] will bring dentures

## 2022-12-16 NOTE — Progress Notes (Signed)
Medication Feraheme that was scheduled to be given @1330  administered @2030  via physician's request for medication to be given when available regardless of time. . Medication could not be signed out due to read only status from Infusion department where med was scheduled to be administered.

## 2022-12-16 NOTE — ED Triage Notes (Signed)
Pt BIB nurse from the 2nd floor infusion center, for hypotension, pt is non-verbal and caregiver is enroute by to hospital, was supposed to get first iron infusion today.

## 2022-12-16 NOTE — ED Notes (Signed)
Date and time results received: 12/16/22 1500 (use smartphrase ".now" to insert current time)  Test: Lactic Acid Critical Value: 2.9  Name of Provider Notified: Dr Estell Harpin  Orders Received? Or Actions Taken?:

## 2022-12-16 NOTE — H&P (Signed)
History and Physical    Patient: Melvin Taylor ZOX:[id number] DOB: 05/12/1966 DOA: 12/16/2022 DOS: the patient was seen and examined on 12/16/2022 PCP: [name], FNP  Patient coming from: Home  Chief Complaint:  Chief Complaint  Patient presents with   Hypotension   HPI: Melvin Taylor is a 57 y.o. male with medical history significant of type 2 diabetes not on medications, history of stroke, hypertension, chronic kidney disease, hypertension.  Patient has chronic anemia due to chronic kidney disease and was scheduled for iron infusions.  He went to get his first 1 today and was found to be hypotensive with a blood pressure in the 70s systolically.  He was taken to the emergency department for evaluation.  Here, his blood pressure continued to be low and was found to have an AKI on chronic kidney disease.  Patient given IV fluid bolus.  Patient is largely nonverbal and history is obtained by chart and patient's sister, who is POA.  Patient reportedly eats fairly well and drinks plenty of fluid. No urinary changes, fevers, chills, nausea.  Review of Systems: As mentioned in the history of present illness. All other systems reviewed and are negative. Past Medical History:  Diagnosis Date   Bipolar 1 disorder (HCC)    depression/anxiety   Bone spur    left heel   Cervical radiculopathy    Chronic back pain    Chronic chest wall pain    Chronic neck pain    Coronary artery disease    a. s/p CABG in 03/2016 with LIMA-LAD, SVG-D1, SVG-RCA, and Seq SVG-mid and distal OM   Diabetes mellitus    Type II   Hallucinations    "long history of them"   Headache(784.0)    History of gout    HTN (hypertension)    Insomnia    Neuropathy    Pain management    Polysubstance abuse (HCC)    Right leg pain    chronic   Schizophrenia (HCC)    Seizures (HCC)    last sz between July 5-9th, 2016; epilepsy   Sleep apnea    Stroke (HCC)    Transfusion of blood product refused for  religious reason    Past Surgical History:  Procedure Laterality Date   ANTERIOR CERVICAL DECOMP/DISCECTOMY FUSION N/A 12/14/2015   Procedure: ANTERIOR CERVICAL DECOMPRESSION/DISCECTOMY FUSION CERVICAL FIVE -SIX;  Surgeon: Julio Sicks, MD;  Location: [facility];  Service: Neurosurgery;  Laterality: N/A;   BIOPSY  05/07/2015   Procedure: BIOPSY (Gastric);  Surgeon: Corbin Ade, MD;  Location: [facility];  Service: Endoscopy;;   CARDIAC CATHETERIZATION N/A 03/07/2016   Procedure: Left Heart Cath and Coronary Angiography;  Surgeon: Lyn Records, MD;  Location: [facility] INVASIVE CV LAB;  Service: Cardiovascular;  Laterality: N/A;   COLONOSCOPY WITH PROPOFOL N/A 05/07/2015   WJX:[id number] diverticulosis, multiple colon polyps removed, tubular adenoma, serrated colon polyp. Next colonoscopy October 2019   COLONOSCOPY WITH PROPOFOL N/A 08/02/2018   Procedure: COLONOSCOPY WITH PROPOFOL;  Surgeon: Corbin Ade, MD;  Location: [facility];  Service: Endoscopy;  Laterality: N/A;  12:00pm   CORONARY ARTERY BYPASS GRAFT N/A 03/28/2016   Procedure: CORONARY ARTERY BYPASS GRAFTING (CABG) x 5 USING GREATER SAPHENOUS VEIN;  Surgeon: Alleen Borne, MD;  Location: [facility] OR;  Service: Open Heart Surgery;  Laterality: N/A;   ENDOVEIN HARVEST OF GREATER SAPHENOUS VEIN Right 03/28/2016   Procedure: ENDOVEIN HARVEST OF GREATER SAPHENOUS VEIN;  Surgeon: Alleen Borne, MD;  Location: [facility] OR;  Service: Open Heart Surgery;  Laterality: Right;   ESOPHAGEAL DILATION N/A 05/07/2015   Procedure: ESOPHAGEAL DILATION WITH 56FR MALONEY DILATOR;  Surgeon: Corbin Ade, MD;  Location: [facility];  Service: Endoscopy;  Laterality: N/A;   ESOPHAGOGASTRODUODENOSCOPY (EGD) WITH PROPOFOL N/A 05/07/2015   RMR: Status post dilation of normal esophagus. Gastritis.   IR CT HEAD LTD  12/17/2020   IR CT HEAD LTD  12/17/2020   IR INTRA CRAN STENT  12/17/2020   IR PERCUTANEOUS ART THROMBECTOMY/INFUSION INTRACRANIAL INC DIAG ANGIO  12/17/2020   IR  RADIOLOGIST EVAL & MGMT  03/25/2021   KNEE SURGERY Left    arthroscopy   MANDIBLE FRACTURE SURGERY     POLYPECTOMY  05/07/2015   Procedure: POLYPECTOMY (Hepatic Flexure, Distal Transverse Colon, Rectal);  Surgeon: Corbin Ade, MD;  Location: [facility];  Service: Endoscopy;;   RADIOLOGY WITH ANESTHESIA N/A 12/16/2020   Procedure: IR WITH ANESTHESIA;  Surgeon: Julieanne Cotton, MD;  Location: [facility] OR;  Service: Radiology;  Laterality: N/A;   TEE WITHOUT CARDIOVERSION N/A 03/28/2016   Procedure: TRANSESOPHAGEAL ECHOCARDIOGRAM (TEE);  Surgeon: Alleen Borne, MD;  Location: [facility] OR;  Service: Open Heart Surgery;  Laterality: N/A;   TONSILLECTOMY     TRIGGER FINGER RELEASE Left 10/15/2021   Procedure: RELEASE TRIGGER FINGER/A-1 PULLEY left middle or long finger;  Surgeon: Vickki Hearing, MD;  Location: [facility]S;  Service: Orthopedics;  Laterality: Left;   Social History:  reports that he has quit smoking. His smoking use included cigarettes. He has a 15.00 pack-year smoking history. He has never used smokeless tobacco. He reports that he does not currently use alcohol. He reports that he does not currently use drugs after having used the following drugs: Marijuana.  Allergies  Allergen Reactions   Aspirin Other (See Comments), Swelling and Itching    Facial swelling  Facial swelling , Reaction:  Facial swelling    Family History  Problem Relation Age of Onset   Diabetes Father    Hypertension Father    Sleep apnea Father    Stroke Sister    Arthritis Other    Diabetes Other    Asthma Other     Prior to Admission medications   Medication Sig Start Date End Date Taking? Authorizing Provider  ACCU-CHEK GUIDE test strip USE AS DIRECTED TOoMONITOR BLOOD SUGAR (4)mTIMES DAILY.e 09/16/22   [provider]  acetaminophen (TYLENOL) 160 MG/5ML solution Place 20.3 mLs (650 mg total) into feeding tube every 4 (four) hours as needed for mild pain (or temp > 37.5 C (99.5 F)). 09/14/22    Angiulli, Mcarthur Rossetti, PA-C  albuterol (VENTOLIN HFA) 108 (90 Base) MCG/ACT inhaler Inhale 2 puffs into the lungs every 4 (four) hours as needed for wheezing or shortness of breath. 09/14/22   Angiulli, Mcarthur Rossetti, PA-C  allopurinol (ZYLOPRIM) 100 MG tablet Take 100 mg by mouth daily.    Bhutani, Manpreet S, MD  amLODipine (NORVASC) 10 MG tablet Take 10 mg by mouth daily. 11/29/22   [provider]  amLODipine-benazepril (LOTREL) 10-40 MG capsule Take 1 capsule by mouth daily.    Pearson Grippe, MD  atorvastatin (LIPITOR) 80 MG tablet Take 1 tablet (80 mg total) by mouth at bedtime. 09/14/22   Angiulli, Mcarthur Rossetti, PA-C  cycloSPORINE (RESTASIS) 0.05 % ophthalmic emulsion Place 1 drop into both eyes 2 (two) times daily. 09/14/22   Angiulli, Mcarthur Rossetti, PA-C  divalproex (DEPAKOTE) 500 MG DR tablet Take by mouth. 05/07/19  [provider]  epoetin alfa (EPOGEN) 3000 UNIT/ML injection Inject into the skin. 09/30/21   [provider]  escitalopram (LEXAPRO) 20 MG tablet Take 1 tablet (20 mg total) by mouth daily. 09/14/22   Angiulli, Mcarthur Rossetti, PA-C  ferrous sulfate 325 (65 FE) MG tablet Take 325 mg by mouth daily.    [provider]  isosorbide mononitrate (IMDUR) 30 MG 24 hr tablet Take 1 tablet (30 mg total) by mouth daily. Patient taking differently: Take 60 mg by mouth daily. Hold SBP <110 or MAP <70 03/09/21   Ihor Austin, NP  lamoTRIgine (LAMICTAL) 25 MG tablet TAKE 2 TABLETS BY MOUTH TWICE DAILY. 11/30/22   Ihor Austin, NP  metoprolol tartrate (LOPRESSOR) 25 MG tablet Take 0.5 tablets (12.5 mg total) by mouth 2 (two) times daily. 09/14/22   Angiulli, Mcarthur Rossetti, PA-C  mirtazapine (REMERON SOL-TAB) 15 MG disintegrating tablet Take 15 mg by mouth at bedtime.    Pearson Grippe, MD  Multiple Vitamin (MULTIVITAMIN WITH MINERALS) TABS tablet Take 1 tablet by mouth daily. 09/14/22   Angiulli, Mcarthur Rossetti, PA-C  pantoprazole (PROTONIX) 40 MG tablet Take 40 mg by mouth daily.    Hyler, Janine Limbo, NP   ticagrelor (BRILINTA) 90 MG TABS tablet Take 1 tablet (90 mg total) by mouth 2 (two) times daily. 09/14/22   Angiulli, Mcarthur Rossetti, PA-C  valsartan (DIOVAN) 40 MG tablet Take 20 mg by mouth daily. 11/29/22   [provider]  Vitamin D, Ergocalciferol, (DRISDOL) 1.25 MG (50000 UNIT) CAPS capsule Take 1 capsule (50,000 Units total) by mouth every 7 (seven) days. 09/14/22   Charlton Amor, PA-C    Physical Exam: Vitals:   12/16/22 1430 12/16/22 1500 12/16/22 1520 12/16/22 1530  BP: 100/74 103/61 117/80   Pulse: 69  75 75  Resp: 18  18 18   Temp:      TempSrc:      SpO2: 99%  99% 99%  Weight:      Height:       General: middle age male. Awake and alert and oriented x3. No acute cardiopulmonary distress.  HEENT: Normocephalic atraumatic.  Right and left ears normal in appearance.  Pupils equal, round, reactive to light. Extraocular muscles are intact. Sclerae anicteric and noninjected.  Moist mucosal membranes. No mucosal lesions.  Neck: Neck supple without lymphadenopathy. No carotid bruits. No masses palpated.  Cardiovascular: Regular rate with normal S1-S2 sounds. No murmurs, rubs, gallops auscultated. No JVD.  Respiratory: Good respiratory effort with no wheezes, rales, rhonchi. Lungs clear to auscultation bilaterally.  No accessory muscle use. Abdomen: Soft, nontender, nondistended. Active bowel sounds. No masses or hepatosplenomegaly  Skin: No rashes, lesions, or ulcerations.  Dry, warm to touch. 2+ dorsalis pedis and radial pulses. Musculoskeletal: No calf or leg pain. All major joints not erythematous nontender.  No upper or lower joint deformation.  Good ROM.  No contractures  Psychiatric: Intact judgment and insight. Pleasant and cooperative. Neurologic: No focal neurological deficits. Strength is 5/5 and symmetric in upper and lower extremities.  Cranial nerves II through XII are grossly intact.  Data Reviewed: Results for orders placed or performed during the hospital  encounter of 12/16/22 (from the past 24 hour(s))  Comprehensive metabolic panel     Status: Abnormal   Collection Time: 12/16/22  2:05 PM  Result Value Ref Range   Sodium 134 (L) 135 - 145 mmol/L   Potassium 4.2 3.5 - 5.1 mmol/L   Chloride 100 98 - 111 mmol/L  CO2 23 22 - 32 mmol/L   Glucose, Bld 137 (H) 70 - 99 mg/dL   BUN 28 (H) 6 - 20 mg/dL   Creatinine, Ser 1.61 (H) 0.61 - 1.24 mg/dL   Calcium 8.5 (L) 8.9 - 10.3 mg/dL   Total Protein 6.1 (L) 6.5 - 8.1 g/dL   Albumin 3.2 (L) 3.5 - 5.0 g/dL   AST 24 15 - 41 U/L   ALT 13 0 - 44 U/L   Alkaline Phosphatase 45 38 - 126 U/L   Total Bilirubin 0.6 0.3 - 1.2 mg/dL   GFR, Estimated 30 (L) >60 mL/min   Anion gap 11 5 - 15  CBC with Differential     Status: Abnormal   Collection Time: 12/16/22  2:05 PM  Result Value Ref Range   WBC 5.7 4.0 - 10.5 K/uL   RBC 2.82 (L) 4.22 - 5.81 MIL/uL   Hemoglobin 8.7 (L) 13.0 - 17.0 g/dL   HCT 09.6 (L) 04.5 - 40.9 %   MCV 94.3 80.0 - 100.0 fL   MCH 30.9 26.0 - 34.0 pg   MCHC 32.7 30.0 - 36.0 g/dL   RDW 81.1 (H) 91.4 - 78.2 %   Platelets 274 150 - 400 K/uL   nRBC 0.0 0.0 - 0.2 %   Neutrophils Relative % 28 %   Neutro Abs 1.6 (L) 1.7 - 7.7 K/uL   Lymphocytes Relative 55 %   Lymphs Abs 3.2 0.7 - 4.0 K/uL   Monocytes Relative 14 %   Monocytes Absolute 0.8 0.1 - 1.0 K/uL   Eosinophils Relative 2 %   Eosinophils Absolute 0.1 0.0 - 0.5 K/uL   Basophils Relative 0 %   Basophils Absolute 0.0 0.0 - 0.1 K/uL   Immature Granulocytes 1 %   Abs Immature Granulocytes 0.03 0.00 - 0.07 K/uL  Culture, blood (Routine x 2)     Status: None (Preliminary result)   Collection Time: 12/16/22  2:05 PM   Specimen: BLOOD  Result Value Ref Range   Specimen Description BLOOD BLOOD LEFT ARM    Special Requests      BOTTLES DRAWN AEROBIC AND ANAEROBIC Blood Culture adequate volume Performed at [facility], [address], [place], [place] [address]    Culture PENDING    Report Status PENDING   Culture, blood  (Routine x 2)     Status: None (Preliminary result)   Collection Time: 12/16/22  2:15 PM   Specimen: BLOOD  Result Value Ref Range   Specimen Description BLOOD BLOOD RIGHT HAND    Special Requests      BOTTLES DRAWN AEROBIC AND ANAEROBIC Blood Culture adequate volume Performed at [facility], [address] [place], [place] [address]    Culture PENDING    Report Status PENDING   Lactic acid, plasma     Status: Abnormal   Collection Time: 12/16/22  4:05 PM  Result Value Ref Range   Lactic Acid, Venous 2.9 (HH) 0.5 - 1.9 mmol/L  Urinalysis, w/ Reflex to Culture (Infection Suspected) -Urine, Clean Catch     Status: Abnormal   Collection Time: 12/16/22  5:05 PM  Result Value Ref Range   Specimen Source URINE, CLEAN CATCH    Color, Urine COLORLESS (A) YELLOW   APPearance CLEAR CLEAR   Specific Gravity, Urine 1.004 (L) 1.005 - 1.030   pH 6.0 5.0 - 8.0   Glucose, UA NEGATIVE NEGATIVE mg/dL   Hgb urine dipstick NEGATIVE NEGATIVE   Bilirubin Urine NEGATIVE NEGATIVE   Ketones,  ur NEGATIVE NEGATIVE mg/dL   Protein, ur NEGATIVE NEGATIVE mg/dL   Nitrite NEGATIVE NEGATIVE   Leukocytes,Ua NEGATIVE NEGATIVE   RBC / HPF 0-5 0 - 5 RBC/hpf   WBC, UA 0-5 0 - 5 WBC/hpf   Bacteria, UA NONE SEEN NONE SEEN   Squamous Epithelial / HPF 0-5 0 - 5 /HPF    DG Chest Port 1 View  Result Date: 12/16/2022 CLINICAL DATA:  Hypotension EXAM: PORTABLE CHEST 1 VIEW COMPARISON:  08/27/2022 FINDINGS: Interval removal of previously noted PICC line. Status post median sternotomy. Unchanged cardiac and mediastinal contours. No focal pulmonary opacity. No pleural effusion or pneumothorax. No acute osseous abnormality. IMPRESSION: No active disease. Electronically Signed   By: Wiliam Ke M.D.   On: 12/16/2022 14:48     Assessment and Plan: No notes have been filed under this hospital service. Service: Hospitalist  Principal Problem:   Hypotension Active Problems:   CAD (coronary artery disease)   Diabetes  mellitus (HCC)   Renal insufficiency   Anemia due to stage 3b chronic kidney disease (HCC)   Acute CVA (cerebrovascular accident) (HCC)   Seizure disorder (HCC)   Protein-calorie malnutrition, severe  Hypotension Hold BP meds IVF AKI on CKD IVF Recheck SCr tomorrow Anemia of chronic disease Venofer today DM2 Controlled off meds  Severe malnutrition Ensure bentween meals.   Advance Care Planning:   Code Status: Full Code confirm  Consults:   Family Communication: Discussed patient with sister  Severity of Illness: The appropriate patient status for this patient is INPATIENT. Inpatient status is judged to be reasonable and necessary in order to provide the required intensity of service to ensure the patient's safety. The patient's presenting symptoms, physical exam findings, and initial radiographic and laboratory data in the context of their chronic comorbidities is felt to place them at high risk for further clinical deterioration. Furthermore, it is not anticipated that the patient will be medically stable for discharge from the hospital within 2 midnights of admission.   * I certify that at the point of admission it is my clinical judgment that the patient will require inpatient hospital care spanning beyond 2 midnights from the point of admission due to high intensity of service, high risk for further deterioration and high frequency of surveillance required.*  Author: Levie Heritage, DO 12/16/2022 5:51 PM  For on call review [email or web address].

## 2022-12-16 NOTE — Telephone Encounter (Signed)
Patient presented today for first dose of IV Feraheme. Patient roomed and initial BP 74/59, repeat BP 75/53. Patient alert and able to indicate needs, but unable to verbalize symptoms. Other VSS.   Called Dr. Lucio Edward office ([facility] [phone]). Spoke with Liborio Nixon and relayed patient's BP. Per Liborio Nixon, provider stated to take patient to ED to be assessed.   Patient taken to ED by w/c. Called patient's caregiver [name] [phone]) and sister/POA [name] [phone]) to let them know patient's location. They verbalized understanding and all questions answered.  Wyvonne Lenz, RN

## 2022-12-16 NOTE — ED Provider Notes (Signed)
[facility] Provider Note   CSN: [id number] Arrival date & time: 12/16/22  1326     History {Add pertinent medical, surgical, social history, OB history to HPI:1} Chief Complaint  Patient presents with   Hypotension    Melvin Taylor is a 57 y.o. male.  Patient has a history of bipolar schizophrenia diabetes.  He also has a history of anemia.  Patient was supposed to get a iron infusion today but was hypotensive   Weakness Severity:  Mild Onset quality:  Sudden Timing:  Constant Progression:  Waxing and waning Chronicity:  New Context: not alcohol use   Relieved by:  None tried Ineffective treatments:  None tried Associated symptoms: no abdominal pain, no chest pain, no cough, no diarrhea, no frequency, no headaches and no seizures   Risk factors: no anemia        Home Medications Prior to Admission medications   Medication Sig Start Date End Date Taking? Authorizing Provider  ACCU-CHEK GUIDE test strip USE AS DIRECTED TOoMONITOR BLOOD SUGAR (4)mTIMES DAILY.e 09/16/22   [provider]  acetaminophen (TYLENOL) 160 MG/5ML solution Place 20.3 mLs (650 mg total) into feeding tube every 4 (four) hours as needed for mild pain (or temp > 37.5 C (99.5 F)). 09/14/22   Angiulli, Mcarthur Rossetti, PA-C  albuterol (VENTOLIN HFA) 108 (90 Base) MCG/ACT inhaler Inhale 2 puffs into the lungs every 4 (four) hours as needed for wheezing or shortness of breath. 09/14/22   Angiulli, Mcarthur Rossetti, PA-C  allopurinol (ZYLOPRIM) 100 MG tablet Take 100 mg by mouth daily.    Bhutani, Manpreet S, MD  amLODipine-benazepril (LOTREL) 10-40 MG capsule Take 1 capsule by mouth daily.    Pearson Grippe, MD  atorvastatin (LIPITOR) 80 MG tablet Take 1 tablet (80 mg total) by mouth at bedtime. 09/14/22   Angiulli, Mcarthur Rossetti, PA-C  cycloSPORINE (RESTASIS) 0.05 % ophthalmic emulsion Place 1 drop into both eyes 2 (two) times daily. 09/14/22   Angiulli, Mcarthur Rossetti, PA-C   divalproex (DEPAKOTE) 500 MG DR tablet Take by mouth. 05/07/19   [provider]  epoetin alfa (EPOGEN) 3000 UNIT/ML injection Inject into the skin. 09/30/21   [provider]  escitalopram (LEXAPRO) 20 MG tablet Take 1 tablet (20 mg total) by mouth daily. 09/14/22   Angiulli, Mcarthur Rossetti, PA-C  ferrous sulfate 325 (65 FE) MG tablet Take 325 mg by mouth daily.    [provider]  isosorbide mononitrate (IMDUR) 30 MG 24 hr tablet Take 1 tablet (30 mg total) by mouth daily. Patient taking differently: Take 60 mg by mouth daily. Hold SBP <110 or MAP <70 03/09/21   Ihor Austin, NP  lamoTRIgine (LAMICTAL) 25 MG tablet TAKE 2 TABLETS BY MOUTH TWICE DAILY. 11/30/22   Ihor Austin, NP  metoprolol tartrate (LOPRESSOR) 25 MG tablet Take 0.5 tablets (12.5 mg total) by mouth 2 (two) times daily. 09/14/22   Angiulli, Mcarthur Rossetti, PA-C  mirtazapine (REMERON SOL-TAB) 15 MG disintegrating tablet Take 15 mg by mouth at bedtime.    Pearson Grippe, MD  Multiple Vitamin (MULTIVITAMIN WITH MINERALS) TABS tablet Take 1 tablet by mouth daily. 09/14/22   Angiulli, Mcarthur Rossetti, PA-C  pantoprazole (PROTONIX) 40 MG tablet Take 40 mg by mouth daily.    Hyler, Janine Limbo, NP  ticagrelor (BRILINTA) 90 MG TABS tablet Take 1 tablet (90 mg total) by mouth 2 (two) times daily. 09/14/22   Angiulli, Mcarthur Rossetti, PA-C  Vitamin D, Ergocalciferol, (DRISDOL) 1.25  MG (50000 UNIT) CAPS capsule Take 1 capsule (50,000 Units total) by mouth every 7 (seven) days. 09/14/22   Angiulli, Mcarthur Rossetti, PA-C      Allergies    Aspirin    Review of Systems   Review of Systems  Constitutional:  Negative for appetite change and fatigue.  HENT:  Negative for congestion, ear discharge and sinus pressure.   Eyes:  Negative for discharge.  Respiratory:  Negative for cough.   Cardiovascular:  Negative for chest pain.  Gastrointestinal:  Negative for abdominal pain and diarrhea.  Genitourinary:  Negative for frequency and hematuria.  Musculoskeletal:   Negative for back pain.  Skin:  Negative for rash.  Neurological:  Positive for weakness. Negative for seizures and headaches.  Psychiatric/Behavioral:  Negative for hallucinations.     Physical Exam Updated Vital Signs BP 108/73   Pulse 72   Temp 98.1 F (36.7 C) (Oral)   Resp 20   Ht 5\' 7"  (1.702 m)   Wt 54.7 kg   SpO2 98%   BMI 18.89 kg/m  Physical Exam Vitals and nursing note reviewed.  Constitutional:      Appearance: He is well-developed.  HENT:     Head: Normocephalic.     Nose: Nose normal.  Eyes:     General: No scleral icterus.    Conjunctiva/sclera: Conjunctivae normal.  Neck:     Thyroid: No thyromegaly.  Cardiovascular:     Rate and Rhythm: Normal rate and regular rhythm.     Heart sounds: No murmur heard.    No friction rub. No gallop.  Pulmonary:     Breath sounds: No stridor. No wheezing or rales.  Chest:     Chest wall: No tenderness.  Abdominal:     General: There is no distension.     Tenderness: There is no abdominal tenderness. There is no rebound.  Musculoskeletal:        General: Normal range of motion.     Cervical back: Neck supple.  Lymphadenopathy:     Cervical: No cervical adenopathy.  Skin:    Findings: No erythema or rash.  Neurological:     Mental Status: He is alert and oriented to person, place, and time.     Motor: No abnormal muscle tone.     Coordination: Coordination normal.  Psychiatric:        Behavior: Behavior normal.     ED Results / Procedures / Treatments   Labs (all labs ordered are listed, but only abnormal results are displayed) Labs Reviewed  COMPREHENSIVE METABOLIC PANEL - Abnormal; Notable for the following components:      Result Value   Sodium 134 (*)    Glucose, Bld 137 (*)    BUN 28 (*)    Creatinine, Ser 2.41 (*)    Calcium 8.5 (*)    Total Protein 6.1 (*)    Albumin 3.2 (*)    GFR, Estimated 30 (*)    All other components within normal limits  LACTIC ACID, PLASMA - Abnormal; Notable for  the following components:   Lactic Acid, Venous 2.9 (*)    All other components within normal limits  CBC WITH DIFFERENTIAL/PLATELET - Abnormal; Notable for the following components:   RBC 2.82 (*)    Hemoglobin 8.7 (*)    HCT 26.6 (*)    RDW 17.1 (*)    Neutro Abs 1.6 (*)    All other components within normal limits  CULTURE, BLOOD (ROUTINE X 2)  CULTURE, BLOOD (  ROUTINE X 2)  URINALYSIS, W/ REFLEX TO CULTURE (INFECTION SUSPECTED)  LACTIC ACID, PLASMA    EKG None  Radiology DG Chest Port 1 View  Result Date: 12/16/2022 CLINICAL DATA:  Hypotension EXAM: PORTABLE CHEST 1 VIEW COMPARISON:  08/27/2022 FINDINGS: Interval removal of previously noted PICC line. Status post median sternotomy. Unchanged cardiac and mediastinal contours. No focal pulmonary opacity. No pleural effusion or pneumothorax. No acute osseous abnormality. IMPRESSION: No active disease. Electronically Signed   By: Wiliam Ke M.D.   On: 12/16/2022 14:48    Procedures Procedures  {Document cardiac monitor, telemetry assessment procedure when appropriate:1}  Medications Ordered in ED Medications  sodium chloride 0.9 % bolus 2,000 mL (2,000 mLs Intravenous New Bag/Given 12/16/22 1413)    ED Course/ Medical Decision Making/ A&P   {   Click here for ABCD2, HEART and other calculatorsREFRESH Note before signing :1}                          Medical Decision Making Amount and/or Complexity of Data Reviewed Labs: ordered.  Risk Decision regarding hospitalization.   Patient with an AKI and anemia.  Initial hypotension.  He will be admitted to medicine and hydrated  {Document critical care time when appropriate:1} {Document review of labs and clinical decision tools ie heart score, Chads2Vasc2 etc:1}  {Document your independent review of radiology images, and any outside records:1} {Document your discussion with family members, caretakers, and with consultants:1} {Document social determinants of health  affecting pt's care:1} {Document your decision making why or why not admission, treatments were needed:1} Final Clinical Impression(s) / ED Diagnoses Final diagnoses:  None    Rx / DC Orders ED Discharge Orders     None

## 2022-12-17 ENCOUNTER — Inpatient Hospital Stay (HOSPITAL_COMMUNITY): Payer: Medicaid Other

## 2022-12-17 DIAGNOSIS — I9589 Other hypotension: Secondary | ICD-10-CM | POA: Diagnosis not present

## 2022-12-17 DIAGNOSIS — G40909 Epilepsy, unspecified, not intractable, without status epilepticus: Secondary | ICD-10-CM | POA: Diagnosis not present

## 2022-12-17 DIAGNOSIS — E43 Unspecified severe protein-calorie malnutrition: Secondary | ICD-10-CM | POA: Diagnosis not present

## 2022-12-17 LAB — CBC
HCT: 25.2 % — ABNORMAL LOW (ref 39.0–52.0)
Hemoglobin: 8.3 g/dL — ABNORMAL LOW (ref 13.0–17.0)
MCH: 31 pg (ref 26.0–34.0)
MCHC: 32.9 g/dL (ref 30.0–36.0)
MCV: 94 fL (ref 80.0–100.0)
Platelets: 253 10*3/uL (ref 150–400)
RBC: 2.68 MIL/uL — ABNORMAL LOW (ref 4.22–5.81)
RDW: 16.9 % — ABNORMAL HIGH (ref 11.5–15.5)
WBC: 4.5 10*3/uL (ref 4.0–10.5)
nRBC: 0 % (ref 0.0–0.2)

## 2022-12-17 LAB — BASIC METABOLIC PANEL
Anion gap: 6 (ref 5–15)
BUN: 17 mg/dL (ref 6–20)
CO2: 24 mmol/L (ref 22–32)
Calcium: 8.6 mg/dL — ABNORMAL LOW (ref 8.9–10.3)
Chloride: 107 mmol/L (ref 98–111)
Creatinine, Ser: 1.41 mg/dL — ABNORMAL HIGH (ref 0.61–1.24)
GFR, Estimated: 58 mL/min — ABNORMAL LOW (ref >60)
Glucose, Bld: 61 mg/dL — ABNORMAL LOW (ref 70–99)
Potassium: 3.6 mmol/L (ref 3.5–5.1)
Sodium: 137 mmol/L (ref 135–145)

## 2022-12-17 LAB — CULTURE, BLOOD (ROUTINE X 2)

## 2022-12-17 LAB — HEMOGLOBIN A1C
Hgb A1c MFr Bld: 6 % — ABNORMAL HIGH (ref 4.8–5.6)
Mean Plasma Glucose: 125.5 mg/dL

## 2022-12-17 MED ORDER — LORAZEPAM 2 MG/ML IJ SOLN
0.5000 mg | Freq: Four times a day (QID) | INTRAMUSCULAR | Status: DC | PRN
  Administered 2022-12-17: 0.5 mg via INTRAVENOUS
  Filled 2022-12-17: qty 1

## 2022-12-17 MED ORDER — DEXTROSE-SODIUM CHLORIDE 5-0.9 % IV SOLN: INTRAVENOUS | Status: DC

## 2022-12-17 MED ORDER — DARBEPOETIN ALFA 100 MCG/0.5ML IJ SOSY
100.0000 ug | PREFILLED_SYRINGE | Freq: Once | INTRAMUSCULAR | Status: AC
  Administered 2022-12-17: 100 ug via SUBCUTANEOUS
  Filled 2022-12-17: qty 0.5

## 2022-12-17 NOTE — Progress Notes (Signed)
   12/17/22 0754  Provider Notification  Provider Name/Title Dr Marisa Severin  Date Provider Notified 12/17/22  Time Provider Notified [phone]  Method of Notification Page  Notification Reason Other (Comment) (LAB RESULTS SHOWED CBG OF 61)  Provider response No new orders  Date of Provider Response 12/17/22  Time of Provider Response [phone]

## 2022-12-17 NOTE — Progress Notes (Addendum)
Patient very sleepy this am, was only able to say his name. I woke him up and feed him a container of applesauce,and gave him a Ensure,he begin to become more alert. CBG showed 101 at this time,Dr Courage notified. Plan of care on going .

## 2022-12-17 NOTE — Progress Notes (Addendum)
PROGRESS NOTE     Melvin Taylor, is a 57 y.o. male, DOB - [birth year], ZOX:[id number]  Admit date - 12/16/2022   Admitting Physician Levie Heritage, DO  Outpatient Primary MD for the patient is [name], FNP  LOS - 1  Chief Complaint  Patient presents with   Hypotension        Brief Narrative:   57 y.o. male with medical history significant of DM2- not on medications, history of prior ischemic stroke and also prior subdural hemorrhage, hypertension, remote history of polysubstance abuse,  Patient has chronic anemia due to chronic kidney disease admitted on 12/16/2022 with hypotension subsequently developed hypoglycemia on acute metabolic encephalopathy    -Assessment and Plan: 1) acute metabolic encephalopathy--- patient's dad sister and is concerned that his mentation is not back to baseline -Patient has extensive neuro history including history of subdural hematoma, remote infarcts left basal ganglia, anterior left frontal lobe and corona radiata, status post prior left MCA stent placement -Patient does have right-sided weakness which is not new -However is very lethargic, globally confused shaking at times with generalized weakness and is not following commands consistently -Will get CT head -Will check Depakote levels as patient has history of seizures as well -EEG is not available at this time -Use lorazepam as needed seizures--as per family members no recent seizures or over the last 18 to 24 months or so, as patient has been more compliant with antiepileptics lately  2)Hypotension--- suspect due to dehydration/volume depletion -BP improved with IV fluids =-Holding valsartan and metoprolol, on hold amlodipine/benazepril  3) history of prior ischemic stroke/H/o prior subdural hemorrhage---status post prior left MCA stent placement -Continue atorvastatin, continue Brilinta for secondary stroke prophylaxis  4)H/o SZ--please see #1 above -Check Depakote  levels -Continue Lamictal and Depakote -May use IV diazepam as needed  5) hypoglycemia--- okay to continue dextrose IV solution until oral intake is more reliable -Patient with remote history of DM but not on medications for this -A1c pending  6) chronic anemia with iron deficiency--- patient received Aranesp and iron infusion -Hgb stable currently above 8 despite IV fluids/hemodilution  7)AKI----acute kidney injury due to dehydration and hypotension compounded by valsartan/benazepril use -Creatinine is down to 1.4 from 2.4 on admission with IVF -hydration -Further review of records shows creatinine of 1.11 on 09/09/2022 and creatinine of 1.12 on 09/07/2022 -Patient does Not have CKD per se  renally adjust medications, avoid nephrotoxic agents / dehydration  / hypotension  Status is: Inpatient   Disposition: The patient is from: Home              Anticipated d/c is to: Home              Anticipated d/c date is: 1 day              Patient currently is not medically stable to d/c. Barriers: Not Clinically Stable-   Code Status :  -  Code Status: Full Code   Family Communication:   Discussed with patient's sister [name]  Who is also his healthcare power of attorney, patient and [name], and patient's father Mr. Mykol Goytia  DVT Prophylaxis  :   - SCDs   enoxaparin (LOVENOX) injection 30 mg Start: 12/16/22 2200   Lab Results  Component Value Date   PLT 253 12/17/2022    Inpatient Medications  Scheduled Meds:  atorvastatin  80 mg Oral QHS   cycloSPORINE  1 drop Both Eyes BID   divalproex  500 mg  Oral Daily   enoxaparin (LOVENOX) injection  30 mg Subcutaneous Q24H   escitalopram  20 mg Oral Daily   feeding supplement  237 mL Oral BID BM   lamoTRIgine  50 mg Oral BID   pantoprazole  40 mg Oral Daily   ticagrelor  90 mg Oral BID   Continuous Infusions:  dextrose 5 % and 0.9 % NaCl 75 mL/hr at 12/17/22 1248   PRN Meds:.acetaminophen **OR** acetaminophen, albuterol,  LORazepam, ondansetron **OR** ondansetron (ZOFRAN) IV, polyethylene glycol   Anti-infectives (From admission, onward)    None         Subjective: [name] today has no fevers, no emesis,  No chest pain,   - Sleepy, oral intake is not great has to be encouraged --  Objective: Vitals:   12/17/22 0019 12/17/22 0339 12/17/22 0936 12/17/22 1335  BP: 112/70 130/89 92/76 125/72  Pulse: 89 93 80 78  Resp: 18 20 19 18   Temp: 98 F (36.7 C) 98.5 F (36.9 C) 97.7 F (36.5 C) 98.6 F (37 C)  TempSrc:   Axillary   SpO2: 100% 100% 100% 97%  Weight:      Height:        Intake/Output Summary (Last 24 hours) at 12/17/2022 1729 Last data filed at 12/17/2022 1533 Gross per 24 hour  Intake 1516.21 ml  Output 2200 ml  Net -683.79 ml   Filed Weights   12/16/22 1345  Weight: 54.7 kg    Physical Exam  Gen:-Sleepy but arousable, cooperative, no acute distress  HEENT:- [place].AT, No sclera icterus Neck-Supple Neck,No JVD,.  Lungs-  CTAB , fair symmetrical air movement CV- S1, S2 normal, regular  Abd-  +ve B.Sounds, Abd Soft, No tenderness,    Extremity/Skin:- No  edema, pedal pulses present  Psych-affect is flat/lethargic, disoriented Neuro-residual right-sided hemiparesis, no new focal deficits, +ve  tremors especially of the right upper extremity which is not really new  Data Reviewed: I have personally reviewed following labs and imaging studies  CBC: Recent Labs  Lab 12/16/22 1405 12/17/22 0529  WBC 5.7 4.5  NEUTROABS 1.6*  --   HGB 8.7* 8.3*  HCT 26.6* 25.2*  MCV 94.3 94.0  PLT 274 253   Basic Metabolic Panel: Recent Labs  Lab 12/16/22 1405 12/17/22 0526  NA 134* 137  K 4.2 3.6  CL 100 107  CO2 23 24  GLUCOSE 137* 61*  BUN 28* 17  CREATININE 2.41* 1.41*  CALCIUM 8.5* 8.6*   GFR: Estimated Creatinine Clearance: 44.2 mL/min (A) (by C-G formula based on SCr of 1.41 mg/dL (H)). Liver Function Tests: Recent Labs  Lab 12/16/22 1405  AST 24  ALT 13   ALKPHOS 45  BILITOT 0.6  PROT 6.1*  ALBUMIN 3.2*   Recent Results (from the past 240 hour(s))  Culture, blood (Routine x 2)     Status: None (Preliminary result)   Collection Time: 12/16/22  2:05 PM   Specimen: BLOOD  Result Value Ref Range Status   Specimen Description BLOOD BLOOD LEFT ARM  Final   Special Requests   Final    BOTTLES DRAWN AEROBIC AND ANAEROBIC Blood Culture adequate volume   Culture   Final    NO GROWTH < 24 HOURS Performed at [facility], [address], [place], [place] [address]    Report Status PENDING  Incomplete  Culture, blood (Routine x 2)     Status: None (Preliminary result)   Collection Time: 12/16/22  2:15 PM   Specimen: BLOOD  Result Value Ref Range Status   Specimen Description BLOOD BLOOD RIGHT HAND  Final   Special Requests   Final    BOTTLES DRAWN AEROBIC AND ANAEROBIC Blood Culture adequate volume   Culture   Final    NO GROWTH < 24 HOURS Performed at [facility], [address], [place], [place] [address]    Report Status PENDING  Incomplete    Radiology Studies: DG Chest Port 1 View  Result Date: 12/16/2022 CLINICAL DATA:  Hypotension EXAM: PORTABLE CHEST 1 VIEW COMPARISON:  08/27/2022 FINDINGS: Interval removal of previously noted PICC line. Status post median sternotomy. Unchanged cardiac and mediastinal contours. No focal pulmonary opacity. No pleural effusion or pneumothorax. No acute osseous abnormality. IMPRESSION: No active disease. Electronically Signed   By: Wiliam Ke M.D.   On: 12/16/2022 14:48    Scheduled Meds:  atorvastatin  80 mg Oral QHS   cycloSPORINE  1 drop Both Eyes BID   divalproex  500 mg Oral Daily   enoxaparin (LOVENOX) injection  30 mg Subcutaneous Q24H   escitalopram  20 mg Oral Daily   feeding supplement  237 mL Oral BID BM   lamoTRIgine  50 mg Oral BID   pantoprazole  40 mg Oral Daily   ticagrelor  90 mg Oral BID   Continuous Infusions:  dextrose 5 % and 0.9 % NaCl 75 mL/hr at 12/17/22 1248     LOS: 1 day   Shon Hale M.D on 12/17/2022 at 5:29 PM  Go to [email or web address] - for contact info  [facility] - Office  [phone]  If 7PM-7AM, please contact night-coverage [email or web address] 12/17/2022, 5:29 PM

## 2022-12-18 DIAGNOSIS — G40909 Epilepsy, unspecified, not intractable, without status epilepticus: Secondary | ICD-10-CM

## 2022-12-18 DIAGNOSIS — I9589 Other hypotension: Secondary | ICD-10-CM

## 2022-12-18 LAB — BASIC METABOLIC PANEL
Anion gap: 8 (ref 5–15)
BUN: 11 mg/dL (ref 6–20)
CO2: 25 mmol/L (ref 22–32)
Calcium: 8.7 mg/dL — ABNORMAL LOW (ref 8.9–10.3)
Chloride: 103 mmol/L (ref 98–111)
Creatinine, Ser: 1.17 mg/dL (ref 0.61–1.24)
GFR, Estimated: >60 mL/min (ref >60)
Glucose, Bld: 70 mg/dL (ref 70–99)
Potassium: 3.5 mmol/L (ref 3.5–5.1)
Sodium: 136 mmol/L (ref 135–145)

## 2022-12-18 LAB — CULTURE, BLOOD (ROUTINE X 2): Culture: NO GROWTH

## 2022-12-18 LAB — VALPROIC ACID LEVEL: Valproic Acid Lvl: 65 ug/mL (ref 50.0–100.0)

## 2022-12-18 LAB — AMMONIA: Ammonia: 44 umol/L — ABNORMAL HIGH (ref 9–35)

## 2022-12-18 MED ORDER — ENOXAPARIN SODIUM 40 MG/0.4ML IJ SOSY
40.0000 mg | PREFILLED_SYRINGE | INTRAMUSCULAR | Status: DC
  Administered 2022-12-18 – 2022-12-20 (×3): 40 mg via SUBCUTANEOUS
  Filled 2022-12-18 (×3): qty 0.4

## 2022-12-18 MED ORDER — LACTULOSE 10 GM/15ML PO SOLN
30.0000 g | Freq: Every day | ORAL | Status: AC
  Administered 2022-12-18 – 2022-12-19 (×2): 30 g via ORAL
  Filled 2022-12-18 (×2): qty 60

## 2022-12-18 MED ORDER — DIVALPROEX SODIUM 250 MG PO DR TAB
500.0000 mg | DELAYED_RELEASE_TABLET | Freq: Two times a day (BID) | ORAL | Status: DC
  Administered 2022-12-18 – 2022-12-20 (×4): 500 mg via ORAL
  Filled 2022-12-18 (×4): qty 2

## 2022-12-18 MED ORDER — POTASSIUM CHLORIDE 20 MEQ PO PACK
40.0000 meq | PACK | Freq: Once | ORAL | Status: AC
  Administered 2022-12-18: 40 meq via ORAL
  Filled 2022-12-18: qty 2

## 2022-12-18 NOTE — Progress Notes (Signed)
PROGRESS NOTE     Melvin Taylor, is a 57 y.o. male, DOB - 04/25/1966, WJX:[id number]  Admit date - 12/16/2022   Admitting Physician Levie Heritage, DO  Outpatient Primary MD for the patient is [name], FNP  LOS - 2  Chief Complaint  Patient presents with   Hypotension      Brief Narrative:   57 y.o. male with medical history significant of DM2- not on medications, history of prior ischemic stroke and also prior subdural hemorrhage, hypertension, remote history of polysubstance abuse,  Patient has chronic anemia due to chronic kidney disease admitted on 12/16/2022 with hypotension subsequently developed hypoglycemia on acute metabolic encephalopathy    -Assessment and Plan: 1) acute metabolic encephalopathy--- patient's dad sister and is concerned that his mentation is not back to baseline -Patient has extensive neuro history including history of subdural hematoma, remote infarcts left basal ganglia, anterior left frontal lobe and corona radiata, status post prior left MCA stent placement -Patient does have right-sided weakness which is not new 12/18/22 -Less lethargic...... patient's sister, patient's antibiotic and patient does not believe that he is not quite back to baseline yet -CT head without New acute findings -Depakote level is therapeutic -Ammonia borderline high at 44-- -give lactulose -Check EEG -Use lorazepam as needed seizures--as per family members no recent seizures or over the last 18 to 24 months or so, as patient has been more compliant with antiepileptics lately  2)Hypotension--- suspect due to dehydration/volume depletion -BP improved with IV fluids =-Holding valsartan and metoprolol, also hold amlodipine/benazepril  3)History of prior ischemic stroke/H/o prior subdural hemorrhage---status post prior left MCA stent placement -Continue atorvastatin, continue Brilinta for secondary stroke prophylaxis  4)H/o SZ--please see #1 above -Therapeutic  Depakote level noted -Continue Lamictal and Depakote -May use IV lorazepam as needed -Check EEG  5)Hypoglycemia--- okay to continue dextrose IV solution until oral intake is more reliable -Patient with remote history of DM but not on medications for this -A1c 6.0 reflecting excellent diabetic control PTA -Per family patient has history of recurrent hypoglycemia so he has to eat about every 3 hours at home and typically he gets a late snack before going to bed  6) chronic anemia with iron deficiency--- patient received Aranesp and iron infusion -Hgb stable currently above 8 despite IV fluids/hemodilution  7)AKI----acute kidney injury due to dehydration and hypotension compounded by valsartan/benazepril use -Creatinine is down to 1.1 from 2.4 on admission with IVF -hydration -Further review of records shows creatinine of 1.11 on 09/09/2022 and creatinine of 1.12 on 09/07/2022 -Patient does Not have CKD per se  renally adjust medications, avoid nephrotoxic agents / dehydration  / hypotension  8) generalized weakness and deconditioning--- get PT eval May need home health PT  Status is: Inpatient   Disposition: The patient is from: Home              Anticipated d/c is to: Home with home health PT              Anticipated d/c date is: 1 day              Patient currently is not medically stable to d/c. Barriers: Not Clinically Stable-   Code Status :  -  Code Status: Full Code   Family Communication:   Discussed with patient's sister [name]  Who is also his healthcare power of attorney, and [name] (who is pt's Engineer, mining) and patient's father Mr. Shakir Isenhour  DVT Prophylaxis  :   -  SCDs   enoxaparin (LOVENOX) injection 40 mg Start: 12/18/22 2200   Lab Results  Component Value Date   PLT 253 12/17/2022   Inpatient Medications  Scheduled Meds:  atorvastatin  80 mg Oral QHS   cycloSPORINE  1 drop Both Eyes BID   divalproex  500 mg Oral Daily   enoxaparin (LOVENOX) injection  40  mg Subcutaneous Q24H   escitalopram  20 mg Oral Daily   feeding supplement  237 mL Oral BID BM   lactulose  30 g Oral Daily   lamoTRIgine  50 mg Oral BID   pantoprazole  40 mg Oral Daily   potassium chloride  40 mEq Oral Once   ticagrelor  90 mg Oral BID   Continuous Infusions:  dextrose 5 % and 0.9 % NaCl 125 mL/hr at 12/18/22 1147   PRN Meds:.acetaminophen **OR** acetaminophen, albuterol, LORazepam, ondansetron **OR** ondansetron (ZOFRAN) IV, polyethylene glycol   Anti-infectives (From admission, onward)    None         Subjective: [name] today has no fevers, no emesis,  No chest pain,   - ---Patient's Sister [name], aunt Kathie Rhodes and dad at bedside -Oral intake improving -More awake and less sleepy  Objective: Vitals:   12/17/22 0936 12/17/22 1335 12/17/22 2027 12/18/22 0443  BP: 92/76 125/72 (!) 149/80 (!) 140/109  Pulse: 80 78 87 91  Resp: 19 18 16 18   Temp: 97.7 F (36.5 C) 98.6 F (37 C) 98.6 F (37 C) 98.5 F (36.9 C)  TempSrc: Axillary  Oral Oral  SpO2: 100% 97% 99% 99%  Weight:      Height:        Intake/Output Summary (Last 24 hours) at 12/18/2022 1657 Last data filed at 12/18/2022 1300 Gross per 24 hour  Intake 600 ml  Output 1000 ml  Net -400 ml   Filed Weights   12/16/22 1345  Weight: 54.7 kg    Physical Exam  Gen:-Waking up  more, cooperative, no acute distress , frail and cachectic appearing HEENT:- [place].AT, No sclera icterus Neck-Supple Neck,No JVD,.  Lungs-  CTAB , fair symmetrical air movement CV- S1, S2 normal, regular  Abd-  +ve B.Sounds, Abd Soft, No tenderness,    Extremity/Skin:- No  edema, pedal pulses present  Psych-affect is flat/lethargic, disoriented Neuro-residual right-sided hemiparesis, no new focal deficits, +ve  tremors especially of the right upper extremity which is not really new  Data Reviewed: I have personally reviewed following labs and imaging studies  CBC: Recent Labs  Lab 12/16/22 1405  12/17/22 0529  WBC 5.7 4.5  NEUTROABS 1.6*  --   HGB 8.7* 8.3*  HCT 26.6* 25.2*  MCV 94.3 94.0  PLT 274 253   Basic Metabolic Panel: Recent Labs  Lab 12/16/22 1405 12/17/22 0526 12/18/22 0451  NA 134* 137 136  K 4.2 3.6 3.5  CL 100 107 103  CO2 23 24 25   GLUCOSE 137* 61* 70  BUN 28* 17 11  CREATININE 2.41* 1.41* 1.17  CALCIUM 8.5* 8.6* 8.7*   GFR: Estimated Creatinine Clearance: 53.2 mL/min (by C-G formula based on SCr of 1.17 mg/dL). Liver Function Tests: Recent Labs  Lab 12/16/22 1405  AST 24  ALT 13  ALKPHOS 45  BILITOT 0.6  PROT 6.1*  ALBUMIN 3.2*   Recent Results (from the past 240 hour(s))  Culture, blood (Routine x 2)     Status: None (Preliminary result)   Collection Time: 12/16/22  2:05 PM   Specimen: BLOOD  Result Value  Ref Range Status   Specimen Description BLOOD BLOOD LEFT ARM  Final   Special Requests   Final    BOTTLES DRAWN AEROBIC AND ANAEROBIC Blood Culture adequate volume   Culture   Final    NO GROWTH 2 DAYS Performed at [facility], [address] [place], [place] [address]    Report Status PENDING  Incomplete  Culture, blood (Routine x 2)     Status: None (Preliminary result)   Collection Time: 12/16/22  2:15 PM   Specimen: BLOOD  Result Value Ref Range Status   Specimen Description BLOOD BLOOD RIGHT HAND  Final   Special Requests   Final    BOTTLES DRAWN AEROBIC AND ANAEROBIC Blood Culture adequate volume   Culture   Final    NO GROWTH 2 DAYS Performed at [facility], [address], [place], [place] [address]    Report Status PENDING  Incomplete    Radiology Studies: CT HEAD WO CONTRAST ( )  Result Date: 12/17/2022 CLINICAL DATA:  Initial evaluation for mental status change, unknown cause. EXAM: CT HEAD WITHOUT CONTRAST TECHNIQUE: Contiguous axial images were obtained from the base of the skull through the vertex without intravenous contrast. RADIATION DOSE REDUCTION: This exam was performed according to the departmental  dose-optimization program which includes automated exposure control, adjustment of the mA and/or kV according to patient size and/or use of iterative reconstruction technique. COMPARISON:  CT from 08/28/2022. FINDINGS: Brain: Mildly advanced cerebral atrophy with chronic small vessel ischemic disease. Remote lacunar infarcts present at the left basal ganglia. Additional small remote cortical infarcts involving the posterior left frontoparietal region. No acute intracranial hemorrhage. No acute large vessel territory infarct. No mass lesion or midline shift. No hydrocephalus or extra-axial fluid collection. Vascular: No abnormal hyperdense vessel. Vascular stent in place within the left M1 segment. Scattered vascular calcifications noted within the carotid siphons. Skull: Scalp soft tissues demonstrate no acute finding. Calvarium intact. Sinuses/Orbits: Globes orbital soft tissues within normal limits. Visualized paranasal sinuses are largely clear. No mastoid effusion. Other: None. IMPRESSION: 1. No acute intracranial abnormality. 2. Chronic left MCA distribution infarcts involving the left frontoparietal region and left basal ganglia. 3. Vascular stent in place within the left M1 segment. 4. Underlying mildly advanced cerebral atrophy with chronic small vessel ischemic disease. Electronically Signed   By: Rise Mu M.D.   On: 12/17/2022 20:20    Scheduled Meds:  atorvastatin  80 mg Oral QHS   cycloSPORINE  1 drop Both Eyes BID   divalproex  500 mg Oral Daily   enoxaparin (LOVENOX) injection  40 mg Subcutaneous Q24H   escitalopram  20 mg Oral Daily   feeding supplement  237 mL Oral BID BM   lactulose  30 g Oral Daily   lamoTRIgine  50 mg Oral BID   pantoprazole  40 mg Oral Daily   potassium chloride  40 mEq Oral Once   ticagrelor  90 mg Oral BID   Continuous Infusions:  dextrose 5 % and 0.9 % NaCl 125 mL/hr at 12/18/22 1147    LOS: 2 days   Shon Hale M.D on 12/18/2022 at 4:57  PM  Go to [email or web address] - for contact info  [facility] - Office  [phone]  If 7PM-7AM, please contact night-coverage [email or web address] 12/18/2022, 4:57 PM

## 2022-12-19 ENCOUNTER — Inpatient Hospital Stay (HOSPITAL_COMMUNITY): Payer: Medicaid Other

## 2022-12-19 ENCOUNTER — Inpatient Hospital Stay (HOSPITAL_COMMUNITY)
Admit: 2022-12-19 | Discharge: 2022-12-19 | Disposition: A | Discharge status: Home / Self Care | Payer: Medicaid Other | Attending: Family Medicine | Admitting: Family Medicine

## 2022-12-19 DIAGNOSIS — E43 Unspecified severe protein-calorie malnutrition: Secondary | ICD-10-CM

## 2022-12-19 DIAGNOSIS — N289 Disorder of kidney and ureter, unspecified: Secondary | ICD-10-CM

## 2022-12-19 DIAGNOSIS — I9589 Other hypotension: Secondary | ICD-10-CM | POA: Diagnosis not present

## 2022-12-19 DIAGNOSIS — G40909 Epilepsy, unspecified, not intractable, without status epilepticus: Secondary | ICD-10-CM | POA: Diagnosis not present

## 2022-12-19 LAB — BASIC METABOLIC PANEL
Anion gap: 5 (ref 5–15)
BUN: 7 mg/dL (ref 6–20)
CO2: 26 mmol/L (ref 22–32)
Calcium: 8.7 mg/dL — ABNORMAL LOW (ref 8.9–10.3)
Chloride: 106 mmol/L (ref 98–111)
Creatinine, Ser: 1.29 mg/dL — ABNORMAL HIGH (ref 0.61–1.24)
GFR, Estimated: >60 mL/min (ref >60)
Glucose, Bld: 65 mg/dL — ABNORMAL LOW (ref 70–99)
Potassium: 4.3 mmol/L (ref 3.5–5.1)
Sodium: 137 mmol/L (ref 135–145)

## 2022-12-19 LAB — GLUCOSE, CAPILLARY
Glucose-Capillary: 101 mg/dL — ABNORMAL HIGH (ref 70–99)
Glucose-Capillary: 129 mg/dL — ABNORMAL HIGH (ref 70–99)
Glucose-Capillary: 158 mg/dL — ABNORMAL HIGH (ref 70–99)
Glucose-Capillary: 167 mg/dL — ABNORMAL HIGH (ref 70–99)
Glucose-Capillary: 231 mg/dL — ABNORMAL HIGH (ref 70–99)
Glucose-Capillary: 66 mg/dL — ABNORMAL LOW (ref 70–99)
Glucose-Capillary: 75 mg/dL (ref 70–99)
Glucose-Capillary: 84 mg/dL (ref 70–99)
Glucose-Capillary: 85 mg/dL (ref 70–99)
Glucose-Capillary: 85 mg/dL (ref 70–99)

## 2022-12-19 MED ORDER — LACTULOSE 10 GM/15ML PO SOLN
30.0000 g | Freq: Once | ORAL | Status: AC
  Administered 2022-12-19: 30 g via ORAL
  Filled 2022-12-19: qty 60

## 2022-12-19 NOTE — Progress Notes (Signed)
EEG complete - results pending 

## 2022-12-19 NOTE — TOC Progression Note (Signed)
Transition of Care [facility]) - Progression Note    Patient Details  Name: NYLES ALDRIDGE MRN: [id number] Date of Birth: 02-01-1966  Transition of Care [facility]) CM/SW Contact  Leitha Bleak, RN Phone Number: 12/19/2022, 2:54 PM  Clinical Narrative:   Patient admitted with Hypotension. Patient has a high risk for readmission. PT is recommending SNF. Per MD wait for MRI to talk with sister. MRI is not showing a new stroke, CM spoke with his sister. He has cap aide for 72 hours a week, family staying at night. She is his POA and does not want him to lose his apartment. She states as long as it is not a new CVA, they will take him home. They did have home health PT in the past, possibly charity. TOC will check to see who has charity week starting tomorrow for HHPT.      Barriers to Discharge: Continued Medical Work up  Expected Discharge Plan and Services      Living arrangements for the past 2 months: Apartment                    Social Determinants of Health (SDOH) Interventions SDOH Screenings   Food Insecurity: No Food Insecurity (12/16/2022)  Housing: Low Risk  (12/16/2022)  Transportation Needs: No Transportation Needs (12/16/2022)  Utilities: Not At Risk (12/16/2022)  Depression (PHQ2-9): Low Risk  (11/01/2022)  Tobacco Use: Medium Risk (12/16/2022)    Readmission Risk Interventions     No data to display

## 2022-12-19 NOTE — Progress Notes (Addendum)
PROGRESS NOTE   Melvin Taylor, is a 57 y.o. male, DOB - Jan 08, 1966, GNF:[id number]  Admit date - 12/16/2022   Admitting Physician Melvin Heritage, DO  Outpatient Primary MD for the patient is Melvin Pearson, FNP  LOS - 3  Chief Complaint  Patient presents with   Hypotension      Brief Narrative:   57 y.o. male with medical history significant of DM2- not on medications, history of prior ischemic stroke and also prior subdural hemorrhage, hypertension, remote history of polysubstance abuse,  Patient has chronic anemia due to chronic kidney disease admitted on 12/16/2022 with hypotension subsequently developed hypoglycemia on acute metabolic encephalopathy    -Assessment and Plan: 1) acute metabolic encephalopathy--- patient's dad sister and is concerned that his mentation is not back to baseline -Patient has extensive neuro history including history of subdural hematoma, remote infarcts left basal ganglia, anterior left frontal lobe and corona radiata, status post prior left MCA stent placement -Patient does have right-sided weakness which is not new 12/19/22 -Much Less lethargic...... patient's sister, patient's antibiotic and patient does not believe that he is not quite back to baseline yet -CT head without New acute findings -MRI brain from 12/19/2022 without acute findings -Depakote level is therapeutic -Ammonia was borderline high at 44-- -mentation much improved with lactulose lactulose -Check EEG--pending -Use lorazepam as needed seizures--as per family members no recent seizures or over the last 18 to 24 months or so, as patient has been more compliant with antiepileptics lately  2)Hypotension--- suspect due to dehydration/volume depletion -BP improved with IV fluids =-Holding valsartan and metoprolol, also hold amlodipine/benazepril  3)History of prior ischemic stroke/H/o prior subdural hemorrhage---status post prior left MCA stent placement -Continue atorvastatin,  continue Brilinta for secondary stroke prophylaxis  4)H/o SZ--please see #1 above -Therapeutic Depakote level noted -Continue Lamictal and Depakote -May use IV lorazepam as needed -Check EEG-pending  5)Hypoglycemia--- okay to continue dextrose IV solution until oral intake is more reliable -Patient with remote history of DM but not on medications for this -A1c 6.0 reflecting excellent diabetic control PTA -Per family patient has history of recurrent hypoglycemia so he has to eat about every 3 hours at home and typically he gets a late snack before going to bed  6) chronic anemia with iron deficiency--- patient received Aranesp and iron infusion -Hgb stable currently above 8 despite IV fluids/hemodilution  7)AKI----acute kidney injury due to dehydration and hypotension compounded by valsartan/benazepril use -Creatinine is down to 1.1 from 2.4 on admission with IVF -hydration -Further review of records shows creatinine of 1.11 on 09/09/2022 and creatinine of 1.12 on 09/07/2022 -Patient does Not have CKD per se  renally adjust medications, avoid nephrotoxic agents / dehydration  / hypotension  8)Generalized weakness and deconditioning--- PT eval appreciated recommends SNF rehab   Status is: Inpatient   Disposition: The patient is from: Home              Anticipated d/c is to: Home with home health PT Vs SNF              Anticipated d/c date is: 1 day              Patient currently is not medically stable to d/c. Barriers: Not Clinically Stable-   Code Status :  -  Code Status: Full Code   Family Communication:   -Left voicemail for patient's sister Melvin Taylor Who is also his healthcare power of attorney,  -and previously discussed with Melvin Taylor (who is pt's  Aunt) and patient's father [name]  DVT Prophylaxis  :   - SCDs   enoxaparin (LOVENOX) injection 40 mg Start: 12/18/22 2200  Lab Results  Component Value Date   PLT 253 12/17/2022   Inpatient Medications  Scheduled  Meds:  atorvastatin  80 mg Oral QHS   cycloSPORINE  1 drop Both Eyes BID   divalproex  500 mg Oral BID   enoxaparin (LOVENOX) injection  40 mg Subcutaneous Q24H   escitalopram  20 mg Oral Daily   feeding supplement  237 mL Oral BID BM   lamoTRIgine  50 mg Oral BID   pantoprazole  40 mg Oral Daily   ticagrelor  90 mg Oral BID   Continuous Infusions:  dextrose 5 % and 0.9 % NaCl 100 mL/hr at 12/19/22 0821   PRN Meds:.acetaminophen **OR** acetaminophen, albuterol, LORazepam, ondansetron **OR** ondansetron (ZOFRAN) IV, polyethylene glycol   Anti-infectives (From admission, onward)    None      Subjective: Melvin Taylor today has no fevers, no emesis,  No chest pain,   - -More awake, -Oral intake is improving -Very weak.,.... gait struggles persist -Left voicemail for patient's sister Melvin Taylor Who is also his healthcare power of attorney,   Objective: Vitals:   12/18/22 1951 12/19/22 0415 12/19/22 1252 12/19/22 1412  BP: (!) 146/96 (!) 143/78 (!) 125/107 (!) 143/82  Pulse: 79 82 83 82  Resp: 18 18 16 17   Temp: 98.6 F (37 C) 98.2 F (36.8 C) 98 F (36.7 C) 98.3 F (36.8 C)  TempSrc: Oral Oral Oral Oral  SpO2: 100% 100% 96% 98%  Weight:      Height:        Intake/Output Summary (Last 24 hours) at 12/19/2022 1736 Last data filed at 12/19/2022 1608 Gross per 24 hour  Intake 132 ml  Output 2350 ml  Net -2218 ml   Filed Weights   12/16/22 1345  Weight: 54.7 kg   Physical Exam  Gen:-Waking up  more, cooperative, no acute distress , frail and cachectic appearing HEENT:- [place].AT, No sclera icterus Neck-Supple Neck,No JVD,.  Lungs-  CTAB , fair symmetrical air movement CV- S1, S2 normal, regular  Abd-  +ve B.Sounds, Abd Soft, No tenderness,    Extremity/Skin:- No  edema, pedal pulses present  Psych-affect is appropriate,, more oriented Neuro-residual right-sided hemiparesis, no new focal deficits, +ve  tremors especially of the right upper extremity which is not  really new, he is very weak and unsteady  Data Reviewed: I have personally reviewed following labs and imaging studies  CBC: Recent Labs  Lab 12/16/22 1405 12/17/22 0529  WBC 5.7 4.5  NEUTROABS 1.6*  --   HGB 8.7* 8.3*  HCT 26.6* 25.2*  MCV 94.3 94.0  PLT 274 253   Basic Metabolic Panel: Recent Labs  Lab 12/16/22 1405 12/17/22 0526 12/18/22 0451 12/19/22 0553  NA 134* 137 136 137  K 4.2 3.6 3.5 4.3  CL 100 107 103 106  CO2 23 24 25 26   GLUCOSE 137* 61* 70 65*  BUN 28* 17 11 7   CREATININE 2.41* 1.41* 1.17 1.29*  CALCIUM 8.5* 8.6* 8.7* 8.7*   GFR: Estimated Creatinine Clearance: 48.3 mL/min (A) (by C-G formula based on SCr of 1.29 mg/dL (H)). Liver Function Tests: Recent Labs  Lab 12/16/22 1405  AST 24  ALT 13  ALKPHOS 45  BILITOT 0.6  PROT 6.1*  ALBUMIN 3.2*   Recent Results (from the past 240 hour(s))  Culture, blood (Routine x 2)  Status: None (Preliminary result)   Collection Time: 12/16/22  2:05 PM   Specimen: BLOOD  Result Value Ref Range Status   Specimen Description BLOOD BLOOD LEFT ARM  Final   Special Requests   Final    BOTTLES DRAWN AEROBIC AND ANAEROBIC Blood Culture adequate volume   Culture   Final    NO GROWTH 2 DAYS Performed at [facility], [address] [place], [place] [address]    Report Status PENDING  Incomplete  Culture, blood (Routine x 2)     Status: None (Preliminary result)   Collection Time: 12/16/22  2:15 PM   Specimen: BLOOD  Result Value Ref Range Status   Specimen Description BLOOD BLOOD RIGHT HAND  Final   Special Requests   Final    BOTTLES DRAWN AEROBIC AND ANAEROBIC Blood Culture adequate volume   Culture   Final    NO GROWTH 2 DAYS Performed at [facility], [address], [place], [place] [address]    Report Status PENDING  Incomplete    Radiology Studies: MR BRAIN WO CONTRAST  Result Date: 12/19/2022 CLINICAL DATA:  Mental status change, unknown cause EXAM: MRI HEAD WITHOUT CONTRAST TECHNIQUE:  Multiplanar, multiecho pulse sequences of the brain and surrounding structures were obtained without intravenous contrast. COMPARISON:  MR Head 08/25/22, CT head 12/17/22 FINDINGS: Limitations: Motion degraded exam. The susceptibility weighted images, FLAIR sequences, and the sagittal T1 weighted sequences are essentially nondiagnostic. Brain: Negative for an acute infarct. No extra-axial fluid collection. No hydrocephalus. There is sequela of at least moderate chronic microvascular ischemic change. Vascular: Normal flow voids. Skull and upper cervical spine: Markedly limited evaluation Sinuses/Orbits: Markedly limited evaluation, but no definite acute abnormality. Other: None. IMPRESSION: Markedly limited exam due to motion artifact. Within this limitation, no acute intracranial process. Electronically Signed   By: Lorenza Cambridge M.D.   On: 12/19/2022 14:28   CT HEAD WO CONTRAST ( )  Result Date: 12/17/2022 CLINICAL DATA:  Initial evaluation for mental status change, unknown cause. EXAM: CT HEAD WITHOUT CONTRAST TECHNIQUE: Contiguous axial images were obtained from the base of the skull through the vertex without intravenous contrast. RADIATION DOSE REDUCTION: This exam was performed according to the departmental dose-optimization program which includes automated exposure control, adjustment of the mA and/or kV according to patient size and/or use of iterative reconstruction technique. COMPARISON:  CT from 08/28/2022. FINDINGS: Brain: Mildly advanced cerebral atrophy with chronic small vessel ischemic disease. Remote lacunar infarcts present at the left basal ganglia. Additional small remote cortical infarcts involving the posterior left frontoparietal region. No acute intracranial hemorrhage. No acute large vessel territory infarct. No mass lesion or midline shift. No hydrocephalus or extra-axial fluid collection. Vascular: No abnormal hyperdense vessel. Vascular stent in place within the left M1 segment.  Scattered vascular calcifications noted within the carotid siphons. Skull: Scalp soft tissues demonstrate no acute finding. Calvarium intact. Sinuses/Orbits: Globes orbital soft tissues within normal limits. Visualized paranasal sinuses are largely clear. No mastoid effusion. Other: None. IMPRESSION: 1. No acute intracranial abnormality. 2. Chronic left MCA distribution infarcts involving the left frontoparietal region and left basal ganglia. 3. Vascular stent in place within the left M1 segment. 4. Underlying mildly advanced cerebral atrophy with chronic small vessel ischemic disease. Electronically Signed   By: Rise Mu M.D.   On: 12/17/2022 20:20    Scheduled Meds:  atorvastatin  80 mg Oral QHS   cycloSPORINE  1 drop Both Eyes BID   divalproex  500 mg Oral BID  enoxaparin (LOVENOX) injection  40 mg Subcutaneous Q24H   escitalopram  20 mg Oral Daily   feeding supplement  237 mL Oral BID BM   lamoTRIgine  50 mg Oral BID   pantoprazole  40 mg Oral Daily   ticagrelor  90 mg Oral BID   Continuous Infusions:  dextrose 5 % and 0.9 % NaCl 100 mL/hr at 12/19/22 0821    LOS: 3 days   Shon Hale M.D on 12/19/2022 at 5:36 PM  Go to [email or web address] - for contact info  [facility] - Office  [phone]  If 7PM-7AM, please contact night-coverage [email or web address] 12/19/2022, 5:36 PM

## 2022-12-19 NOTE — Evaluation (Signed)
Physical Therapy Evaluation Patient Details Name: Melvin Taylor MRN: [id number] DOB: Dec 20, 1965 Today's Date: 12/19/2022  History of Present Illness  Melvin Taylor is a 57 y.o. male with medical history significant of type 2 diabetes not on medications, history of stroke, hypertension, chronic kidney disease, hypertension.  Patient has chronic anemia due to chronic kidney disease and was scheduled for iron infusions.  He went to get his first 1 today and was found to be hypotensive with a blood pressure in the 70s systolically.  He was taken to the emergency department for evaluation.  Here, his blood pressure continued to be low and was found to have an AKI on chronic kidney disease.  Patient given IV fluid bolus.  Patient is largely nonverbal and history is obtained by chart and patient's sister, who is POA.  Patient reportedly eats fairly well and drinks plenty of fluid. No urinary changes, fevers, chills, nausea.   Clinical Impression  Patient demonstrates spastic like movement for extremities during supine to sitting, once seated had episodes of leaning/falling backwards, after verbal/tactile cueing able to maintain sitting balance, very unsteady on feet and limited to a few shuffling side steps before having to sit due to c/o fatigue.  Patient tolerated sitting up in chair after therapy - nursing staff notified.  Patient will benefit from continued skilled physical therapy in hospital and recommended venue below to increase strength, balance, endurance for safe ADLs and gait.         Recommendations for follow up therapy are one component of a multi-disciplinary discharge planning process, led by the attending physician.  Recommendations may be updated based on patient status, additional functional criteria and insurance authorization.  Follow Up Recommendations Can patient physically be transported by private vehicle: No     Assistance Recommended at Discharge Intermittent  Supervision/Assistance  Patient can return home with the following  A lot of help with bathing/dressing/bathroom;A lot of help with walking and/or transfers;Help with stairs or ramp for entrance;Assistance with cooking/housework    Equipment Recommendations None recommended by PT  Recommendations for Other Services       Functional Status Assessment Patient has had a recent decline in their functional status and demonstrates the ability to make significant improvements in function in a reasonable and predictable amount of time.     Precautions / Restrictions Precautions Precautions: Fall Restrictions Weight Bearing Restrictions: No      Mobility  Bed Mobility Overal bed mobility: Needs Assistance Bed Mobility: Supine to Sit     Supine to sit: Mod assist     General bed mobility comments: increased time, once seated frequent leaning/falling backwards    Transfers Overall transfer level: Needs assistance Equipment used: Rolling walker (2 wheels) Transfers: Sit to/from Stand, Bed to chair/wheelchair/BSC Sit to Stand: Mod assist, Max assist   Step pivot transfers: Mod assist, Max assist       General transfer comment: unsteadyt labored movement with difficulty taking steps due to poor balance, spastic like movement of extremities    Ambulation/Gait Ambulation/Gait assistance: Max assist Gait Distance (Feet): 3 Feet Assistive device: Rolling walker (2 wheels) Gait Pattern/deviations: Decreased step length - right, Decreased step length - left, Decreased stride length, Shuffle Gait velocity: slow     General Gait Details: limited to a few slow labored unsteady side steps requiring tactile assistance for advancing BLE due to spastic like movement  Stairs            Wheelchair Mobility    Modified Rankin (  Stroke Patients Only)       Balance Overall balance assessment: Needs assistance Sitting-balance support: Feet supported, No upper extremity  supported Sitting balance-Leahy Scale: Poor Sitting balance - Comments: fair/poor seated at EOB   Standing balance support: During functional activity, Reliant on assistive device for balance, Bilateral upper extremity supported Standing balance-Leahy Scale: Poor Standing balance comment: using RW                             Pertinent Vitals/Pain Pain Assessment Pain Assessment: No/denies pain    Home Living Family/patient expects to be discharged to:: Private residence Living Arrangements: Other relatives Available Help at Discharge: Family;Available 24 hours/day Type of Home: Apartment Home Access: Elevator       Home Layout: Other (Comment) Home Equipment: Rolling Walker (2 wheels);Cane - single point;Shower seat;Hand held shower head Additional Comments: patient is poor historian, info taken from previous admission    Prior Function Prior Level of Function : Needs assist       Physical Assist : Mobility (physical);ADLs (physical) Mobility (physical): Bed mobility;Transfers;Gait;Stairs   Mobility Comments: household ambulator without AD, uses cane for longer distances ADLs Comments: assisted by home health aide and family     Hand Dominance   Dominant Hand: Right    Extremity/Trunk Assessment   Upper Extremity Assessment Upper Extremity Assessment: Generalized weakness    Lower Extremity Assessment Lower Extremity Assessment: Generalized weakness    Cervical / Trunk Assessment Cervical / Trunk Assessment: Normal  Communication   Communication: Expressive difficulties  Cognition Arousal/Alertness: Awake/alert Behavior During Therapy: Flat affect Overall Cognitive Status: No family/caregiver present to determine baseline cognitive functioning                                          General Comments      Exercises     Assessment/Plan    PT Assessment Patient needs continued PT services  PT Problem List Decreased  strength;Decreased activity tolerance;Decreased balance;Decreased mobility       PT Treatment Interventions DME instruction;Gait training;Stair training;Functional mobility training;Therapeutic activities;Therapeutic exercise;Patient/family education;Balance training    PT Goals (Current goals can be found in the Care Plan section)  Acute Rehab PT Goals Patient Stated Goal: return home PT Goal Formulation: With patient Time For Goal Achievement: 01/02/23 Potential to Achieve Goals: Good    Frequency Min 3X/week     Co-evaluation               AM-PAC PT "6 Clicks" Mobility  Outcome Measure Help needed turning from your back to your side while in a flat bed without using bedrails?: A Lot Help needed moving from lying on your back to sitting on the side of a flat bed without using bedrails?: A Lot Help needed moving to and from a bed to a chair (including a wheelchair)?: A Lot Help needed standing up from a chair using your arms (e.g., wheelchair or bedside chair)?: A Lot Help needed to walk in hospital room?: A Lot Help needed climbing 3-5 steps with a railing? : Total 6 Click Score: 11    End of Session   Activity Tolerance: Patient tolerated treatment well;Patient limited by fatigue Patient left: in chair;with call bell/phone within reach;with chair alarm set Nurse Communication: Mobility status PT Visit Diagnosis: Unsteadiness on feet (R26.81);Other abnormalities of gait and mobility (R26.89);Muscle weakness (  generalized) (M62.81)    Time: 0454-0981 PT Time Calculation (min) (ACUTE ONLY): 32 min   Charges:   PT Evaluation $PT Eval Moderate Complexity: 1 Mod PT Treatments $Therapeutic Activity: 23-37 mins        2:20 PM, 12/19/22 Ocie Bob, MPT Physical Therapist with [facility] [phone] office [phone] mobile phone

## 2022-12-19 NOTE — Plan of Care (Signed)
  Problem: Acute Rehab PT Goals(only PT should resolve) Goal: Pt Will Go Supine/Side To Sit Outcome: Progressing Flowsheets (Taken 12/19/2022 1424) Pt will go Supine/Side to Sit: with minimal assist Goal: Patient Will Transfer Sit To/From Stand Outcome: Progressing Flowsheets (Taken 12/19/2022 1424) Patient will transfer sit to/from stand: with minimal assist Goal: Pt Will Transfer Bed To Chair/Chair To Bed Outcome: Progressing Flowsheets (Taken 12/19/2022 1424) Pt will Transfer Bed to Chair/Chair to Bed: with min assist Goal: Pt Will Ambulate Outcome: Progressing Flowsheets (Taken 12/19/2022 1424) Pt will Ambulate:  25 feet  with minimal assist  with rolling walker   2:24 PM, 12/19/22 Ocie Bob, MPT Physical Therapist with [facility] [phone] office [phone] mobile phone

## 2022-12-20 DIAGNOSIS — N289 Disorder of kidney and ureter, unspecified: Secondary | ICD-10-CM | POA: Diagnosis not present

## 2022-12-20 DIAGNOSIS — I951 Orthostatic hypotension: Secondary | ICD-10-CM | POA: Diagnosis not present

## 2022-12-20 DIAGNOSIS — R4182 Altered mental status, unspecified: Secondary | ICD-10-CM

## 2022-12-20 DIAGNOSIS — G9341 Metabolic encephalopathy: Secondary | ICD-10-CM

## 2022-12-20 DIAGNOSIS — R569 Unspecified convulsions: Secondary | ICD-10-CM

## 2022-12-20 DIAGNOSIS — G40909 Epilepsy, unspecified, not intractable, without status epilepticus: Secondary | ICD-10-CM | POA: Diagnosis not present

## 2022-12-20 LAB — GLUCOSE, CAPILLARY
Glucose-Capillary: 148 mg/dL — ABNORMAL HIGH (ref 70–99)
Glucose-Capillary: 89 mg/dL (ref 70–99)
Glucose-Capillary: 73 mg/dL (ref 70–99)
Glucose-Capillary: 75 mg/dL (ref 70–99)

## 2022-12-20 LAB — CBC
HCT: 26.1 % — ABNORMAL LOW (ref 39.0–52.0)
Hemoglobin: 8.6 g/dL — ABNORMAL LOW (ref 13.0–17.0)
MCH: 30.2 pg (ref 26.0–34.0)
MCHC: 33 g/dL (ref 30.0–36.0)
MCV: 91.6 fL (ref 80.0–100.0)
Platelets: 239 10*3/uL (ref 150–400)
RBC: 2.85 MIL/uL — ABNORMAL LOW (ref 4.22–5.81)
RDW: 17 % — ABNORMAL HIGH (ref 11.5–15.5)
WBC: 4.9 10*3/uL (ref 4.0–10.5)
nRBC: 0 % (ref 0.0–0.2)

## 2022-12-20 LAB — RENAL FUNCTION PANEL
Albumin: 2.9 g/dL — ABNORMAL LOW (ref 3.5–5.0)
Anion gap: 5 (ref 5–15)
BUN: 8 mg/dL (ref 6–20)
CO2: 25 mmol/L (ref 22–32)
Calcium: 8.6 mg/dL — ABNORMAL LOW (ref 8.9–10.3)
Chloride: 106 mmol/L (ref 98–111)
Creatinine, Ser: 1.27 mg/dL — ABNORMAL HIGH (ref 0.61–1.24)
GFR, Estimated: >60 mL/min (ref >60)
Glucose, Bld: 95 mg/dL (ref 70–99)
Phosphorus: 4.3 mg/dL (ref 2.5–4.6)
Potassium: 4.2 mmol/L (ref 3.5–5.1)
Sodium: 136 mmol/L (ref 135–145)

## 2022-12-20 LAB — CULTURE, BLOOD (ROUTINE X 2)

## 2022-12-20 LAB — FOLATE: Folate: 8.7 ng/mL (ref >5.9)

## 2022-12-20 LAB — AMMONIA: Ammonia: 53 umol/L — ABNORMAL HIGH (ref 9–35)

## 2022-12-20 LAB — TSH: TSH: 2.367 u[IU]/mL (ref 0.350–4.500)

## 2022-12-20 LAB — VITAMIN B12: Vitamin B-12: 507 pg/mL (ref 180–914)

## 2022-12-20 MED ORDER — DIVALPROEX SODIUM 250 MG PO DR TAB
250.0000 mg | DELAYED_RELEASE_TABLET | Freq: Two times a day (BID) | ORAL | Status: DC
  Administered 2022-12-21: 250 mg via ORAL
  Filled 2022-12-20: qty 1

## 2022-12-20 MED ORDER — LACTULOSE 10 GM/15ML PO SOLN
30.0000 g | Freq: Two times a day (BID) | ORAL | Status: DC
  Administered 2022-12-20 – 2022-12-21 (×3): 30 g via ORAL
  Filled 2022-12-20 (×3): qty 60

## 2022-12-20 NOTE — Progress Notes (Signed)
Physical Therapy Treatment Patient Details Name: Melvin Taylor MRN: [id number] DOB: 07-27-1966 Today's Date: 12/20/2022   History of Present Illness Melvin Taylor is a 57 y.o. male with medical history significant of type 2 diabetes not on medications, history of stroke, hypertension, chronic kidney disease, hypertension.  Patient has chronic anemia due to chronic kidney disease and was scheduled for iron infusions.  He went to get his first 1 today and was found to be hypotensive with a blood pressure in the 70s systolically.  He was taken to the emergency department for evaluation.  Here, his blood pressure continued to be low and was found to have an AKI on chronic kidney disease.  Patient given IV fluid bolus.  Patient is largely nonverbal and history is obtained by chart and patient's sister, who is POA.  Patient reportedly eats fairly well and drinks plenty of fluid. No urinary changes, fevers, chills, nausea.    PT Comments    Patient presents more alert and able to follow most directions with occasional repeated verbal/tactile cueing. Patient had shaky labored movement for sitting up at bedside, difficulty completing most BLE exercises requiring active assistance due to weakness and stiffness in legs, once standing very shaky with near loss of balance and limited to a few side steps before having to sit due to fall risk.  Patient tolerated sitting up in chair after therapy - nursing staff notified.  Patient will benefit from continued skilled physical therapy in hospital and recommended venue below to increase strength, balance, endurance for safe ADLs and gait.    Recommendations for follow up therapy are one component of a multi-disciplinary discharge planning process, led by the attending physician.  Recommendations may be updated based on patient status, additional functional criteria and insurance authorization.  Follow Up Recommendations  Can patient physically be transported by  private vehicle: No    Assistance Recommended at Discharge Intermittent Supervision/Assistance  Patient can return home with the following A lot of help with bathing/dressing/bathroom;A lot of help with walking and/or transfers;Help with stairs or ramp for entrance;Assistance with cooking/housework   Equipment Recommendations  None recommended by PT    Recommendations for Other Services       Precautions / Restrictions Precautions Precautions: Fall Restrictions Weight Bearing Restrictions: No     Mobility  Bed Mobility Overal bed mobility: Needs Assistance Bed Mobility: Supine to Sit     Supine to sit: Mod assist     General bed mobility comments: had diffiuclty using BUE due to spastic like movement    Transfers Overall transfer level: Needs assistance Equipment used: Rolling walker (2 wheels) Transfers: Sit to/from Stand, Bed to chair/wheelchair/BSC Sit to Stand: Mod assist   Step pivot transfers: Mod assist       General transfer comment: unsteady shaky movement    Ambulation/Gait Ambulation/Gait assistance: Mod assist, Max assist Gait Distance (Feet): 5 Feet Assistive device: Rolling walker (2 wheels) Gait Pattern/deviations: Decreased step length - right, Decreased step length - left, Decreased stride length, Knees buckling Gait velocity: slow     General Gait Details: limited to a few unsteady labored shaky side steps before having to sit due to legs buckling   Stairs             Wheelchair Mobility    Modified Rankin (Stroke Patients Only)       Balance Overall balance assessment: Needs assistance Sitting-balance support: Feet supported, No upper extremity supported Sitting balance-Leahy Scale: Fair Sitting balance - Comments: seated  at EOB with occasional supporting self with BUE   Standing balance support: Reliant on assistive device for balance, During functional activity, Bilateral upper extremity supported Standing balance-Leahy  Scale: Poor Standing balance comment: using RW                            Cognition Arousal/Alertness: Awake/alert Behavior During Therapy: WFL for tasks assessed/performed Overall Cognitive Status: Within Functional Limits for tasks assessed                                 General Comments: required occasional repeated verba/tactile cueing for following instructions        Exercises General Exercises - Lower Extremity Ankle Circles/Pumps: Seated, AROM, Strengthening, Both, 10 reps Long Arc Quad: Seated, AAROM, Strengthening, 10 reps, Both Hip Flexion/Marching: Seated, AAROM, Strengthening, Both, 10 reps    General Comments        Pertinent Vitals/Pain Pain Assessment Pain Assessment: No/denies pain    Home Living                          Prior Function            PT Goals (current goals can now be found in the care plan section) Acute Rehab PT Goals Patient Stated Goal: return home PT Goal Formulation: With patient Time For Goal Achievement: 01/02/23 Potential to Achieve Goals: Good Progress towards PT goals: Progressing toward goals    Frequency    Min 3X/week      PT Plan Current plan remains appropriate    Co-evaluation              AM-PAC PT "6 Clicks" Mobility   Outcome Measure  Help needed turning from your back to your side while in a flat bed without using bedrails?: A Little Help needed moving from lying on your back to sitting on the side of a flat bed without using bedrails?: A Lot Help needed moving to and from a bed to a chair (including a wheelchair)?: A Lot Help needed standing up from a chair using your arms (e.g., wheelchair or bedside chair)?: A Lot Help needed to walk in hospital room?: A Lot Help needed climbing 3-5 steps with a railing? : Total 6 Click Score: 12    End of Session   Activity Tolerance: Patient tolerated treatment well;Patient limited by fatigue Patient left: in  chair;with call bell/phone within reach;with chair alarm set Nurse Communication: Mobility status PT Visit Diagnosis: Unsteadiness on feet (R26.81);Other abnormalities of gait and mobility (R26.89);Muscle weakness (generalized) (M62.81)     Time: 4098-1191 PT Time Calculation (min) (ACUTE ONLY): 24 min  Charges:  $Therapeutic Exercise: 8-22 mins $Therapeutic Activity: 8-22 mins                     3:40 PM, 12/20/22 Ocie Bob, MPT Physical Therapist with [facility] [phone] office [phone] mobile phone

## 2022-12-20 NOTE — Progress Notes (Addendum)
PROGRESS NOTE   Melvin Taylor, is a 57 y.o. male, DOB - 1966/03/26, ZOX:[id number]  Admit date - 12/16/2022   Admitting Physician No admitting provider for patient encounter.  Outpatient Primary MD for the patient is [name], FNP  LOS - 4  Chief Complaint  Patient presents with   Hypotension      Brief Narrative:   57 y.o. male with medical history significant of DM2- not on medications, history of prior ischemic stroke and also prior subdural hemorrhage, hypertension, remote history of polysubstance abuse,  Patient has chronic anemia due to chronic kidney disease admitted on 12/16/2022 with hypotension subsequently developed hypoglycemia on acute metabolic encephalopathy    -Assessment and Plan: 1) acute metabolic encephalopathy--- patient's dad sister and is concerned that his mentation is not back to baseline -Patient has extensive neuro history including history of subdural hematoma, remote infarcts left basal ganglia, anterior left frontal lobe and corona radiata, status post prior left MCA stent placement -Patient does have right-sided weakness which is not new 12/20/22 -Much Less lethargic...... patient's sister does not believe that he is  quite back to baseline yet -CT head without New acute findings -MRI brain from 12/19/2022 without acute findings (limited by motion artifact) -Depakote level is therapeutic -Ammonia was borderline high at 44 >>53 -mentation much improved with lactulose lactulose -EEG--without epileptiform findings -Use lorazepam as needed seizures--as per family members no recent seizures or over the last 18 to 24 months or so, as patient has been more compliant with antiepileptics lately -Please see full neurology consult dated 12/20/2022 -If patient improves with lactulose possible discharge over the next 24 to 48 hours -If patient fails to improve further with lactulose for elevated ammonia then consider transfer to [facility] for MRI with  sedation/lorazepam and continuous EEG --Dr. Melynda Ripple the neurologist called and updated patient's HCPOA today  2)Hypotension--- suspect due to dehydration/volume depletion -BP improved with IV fluids =-Holding valsartan and metoprolol, also hold amlodipine/benazepril  3)History of prior ischemic stroke/H/o prior subdural hemorrhage---status post prior left MCA stent placement -Continue atorvastatin, continue Brilinta for secondary stroke prophylaxis  4)H/o SZ--please see #1 above -Therapeutic Depakote level noted -Continue Lamictal and Depakote -May use IV lorazepam as needed  EEG-without epileptiform findings epileptiform findings  5)Hypoglycemia--- okay to continue dextrose IV solution until oral intake is more reliable -Patient with remote history of DM but not on medications for this -A1c 6.0 reflecting excellent diabetic control PTA -Per family patient has history of recurrent hypoglycemia so he has to eat about every 3 hours at home and typically he gets a late snack before going to bed  6) chronic anemia with iron deficiency--- patient received Aranesp and iron infusion -Hgb stable currently above 8 despite IV fluids/hemodilution -B12 and folate are not low  7)AKI----acute kidney injury due to dehydration and hypotension compounded by valsartan/benazepril use -Creatinine is down to 1.27 from 2.4 on admission with IVF -hydration -Further review of records shows creatinine of 1.11 on 09/09/2022 and creatinine of 1.12 on 09/07/2022 -Patient does Not have CKD per se  renally adjust medications, avoid nephrotoxic agents / dehydration  / hypotension  8)Generalized weakness and deconditioning--- PT eval appreciated recommends SNF rehab  -Patient's HCPOA is considering possible discharge home with home health if improved for further rather than going to SNF  Status is: Inpatient   Disposition: The patient is from: Home              Anticipated d/c is to: Home with home health PT  Vs SNF               Anticipated d/c date is: 1 day              Patient currently is not medically stable to d/c. Barriers: Not Clinically Stable-   Code Status :  -  Code Status: Full Code   Family Communication:   - patient's sister [name] Who is also his healthcare power of attorney,  -and previously discussed with [name] (who is pt's Aunt) and patient's father Mr. [name] -Dr. Melynda Ripple the neurologist called and updated patient's HCPOA today 12/20/22  DVT Prophylaxis  :   - SCDs   enoxaparin (LOVENOX) injection 40 mg Start: 12/18/22 2200  Lab Results  Component Value Date   PLT 239 12/20/2022   Inpatient Medications  Scheduled Meds:  atorvastatin  80 mg Oral QHS   cycloSPORINE  1 drop Both Eyes BID   divalproex  500 mg Oral BID   enoxaparin (LOVENOX) injection  40 mg Subcutaneous Q24H   escitalopram  20 mg Oral Daily   feeding supplement  237 mL Oral BID BM   lactulose  30 g Oral BID   lamoTRIgine  50 mg Oral BID   pantoprazole  40 mg Oral Daily   ticagrelor  90 mg Oral BID   Continuous Infusions:  dextrose 5 % and 0.9 % NaCl 100 mL/hr at 12/20/22 1627   PRN Meds:.acetaminophen **OR** acetaminophen, albuterol, LORazepam, ondansetron **OR** ondansetron (ZOFRAN) IV, polyethylene glycol   Anti-infectives (From admission, onward)    None      Subjective: Melvin Taylor today has no fevers, no emesis,  No chest pain,   - -More awake, -Oral intake is improving -Very weak.,.... gait struggles persist -High fall risk -Dr. Melynda Ripple the neurologist called and updated patient's HCPOA today  Objective: Vitals:   12/19/22 1412 12/19/22 2023 12/20/22 0534 12/20/22 1845  BP: (!) 143/82 (!) 158/97 (!) 124/93 (!) 144/104  Pulse: 82 88  85  Resp: 17 20 18 18   Temp: 98.3 F (36.8 C) 97.8 F (36.6 C) 97.9 F (36.6 C) 99.2 F (37.3 C)  TempSrc: Oral Oral  Oral  SpO2: 98% 100% 100% 100%  Weight:      Height:        Intake/Output Summary (Last 24 hours) at 12/20/2022  1920 Last data filed at 12/20/2022 1700 Gross per 24 hour  Intake 600 ml  Output 400 ml  Net 200 ml   Filed Weights   12/16/22 1345  Weight: 54.7 kg   Physical Exam  Gen:-Waking up  more, cooperative, no acute distress , frail and cachectic appearing HEENT:- [place].AT, No sclera icterus Neck-Supple Neck,No JVD,.  Lungs-  CTAB , fair symmetrical air movement CV- S1, S2 normal, regular  Abd-  +ve B.Sounds, Abd Soft, No tenderness,    Extremity/Skin:- No  edema, pedal pulses present  Psych-affect is appropriate,, more oriented Neuro-residual right-sided hemiparesis, no new focal deficits, +ve  tremors especially of the right upper extremity which is not really new, however he is currently very weak and unsteady-compared to prior baseline  Data Reviewed: I have personally reviewed following labs and imaging studies  CBC: Recent Labs  Lab 12/16/22 1405 12/17/22 0529 12/20/22 0818  WBC 5.7 4.5 4.9  NEUTROABS 1.6*  --   --   HGB 8.7* 8.3* 8.6*  HCT 26.6* 25.2* 26.1*  MCV 94.3 94.0 91.6  PLT 274 253 239   Basic Metabolic Panel: Recent Labs  Lab 12/16/22 1405 12/17/22 0526 12/18/22 0451 12/19/22 0553 2023/01/13 0818  NA 134* 137 136 137 136  K 4.2 3.6 3.5 4.3 4.2  CL 100 107 103 106 106  CO2 23 24 25 26 25   GLUCOSE 137* 61* 70 65* 95  BUN 28* 17 11 7 8   CREATININE 2.41* 1.41* 1.17 1.29* 1.27*  CALCIUM 8.5* 8.6* 8.7* 8.7* 8.6*  PHOS  --   --   --   --  4.3   GFR: Estimated Creatinine Clearance: 49.1 mL/min (A) (by C-G formula based on SCr of 1.27 mg/dL (H)). Liver Function Tests: Recent Labs  Lab 12/16/22 1405 Jan 13, 2023 0818  AST 24  --   ALT 13  --   ALKPHOS 45  --   BILITOT 0.6  --   PROT 6.1*  --   ALBUMIN 3.2* 2.9*   Recent Results (from the past 240 hour(s))  Culture, blood (Routine x 2)     Status: None (Preliminary result)   Collection Time: 12/16/22  2:05 PM   Specimen: BLOOD  Result Value Ref Range Status   Specimen Description BLOOD BLOOD LEFT ARM   Final   Special Requests   Final    BOTTLES DRAWN AEROBIC AND ANAEROBIC Blood Culture adequate volume   Culture   Final    NO GROWTH 4 DAYS Performed at [facility], [address], [place], [place] [address]    Report Status PENDING  Incomplete  Culture, blood (Routine x 2)     Status: None (Preliminary result)   Collection Time: 12/16/22  2:15 PM   Specimen: BLOOD  Result Value Ref Range Status   Specimen Description BLOOD BLOOD RIGHT HAND  Final   Special Requests   Final    BOTTLES DRAWN AEROBIC AND ANAEROBIC Blood Culture adequate volume   Culture   Final    NO GROWTH 4 DAYS Performed at [facility], [address], [place], [place] [address]    Report Status PENDING  Incomplete    Radiology Studies: EEG adult  Result Date: Jan 13, 2023 Charlsie Quest, MD     01/13/2023  8:54 AM Patient Name: STEHEN DONATI MRN: [id number] Epilepsy Attending: Charlsie Quest Referring Physician/Provider: Shon Hale, MD Date: Jan 13, 2023 Duration: 23.38 mins Patient history:  57 y.o. male with medical history significant of DM2- not on medications, history of prior ischemic stroke and also prior subdural hemorrhage, hypertension, remote history of polysubstance abuse, now with weakness. EEG to evaluate for seizure. Level of alertness: Awake AEDs during EEG study: LTG, VPA Technical aspects: This EEG study was done with scalp electrodes positioned according to the 10-20 International system of electrode placement. Electrical activity was reviewed with band pass filter of 1-70Hz , sensitivity of 7 uV/mm, display speed of 109mm/sec with a 60Hz  notched filter applied as appropriate. EEG data were recorded continuously and digitally stored.  Video monitoring was available and reviewed as appropriate. Description: EEG showed continuous generalized 3 to 6 Hz theta-delta slowing. Hyperventilation and photic stimulation were not performed.   ABNORMALITY - Continuous slow, generalized IMPRESSION: This study is  suggestive of moderate diffuse encephalopathy, nonspecific etiology. No seizures or epileptiform discharges were seen throughout the recording. Charlsie Quest   MR BRAIN WO CONTRAST  Result Date: 12/19/2022 CLINICAL DATA:  Mental status change, unknown cause EXAM: MRI HEAD WITHOUT CONTRAST TECHNIQUE: Multiplanar, multiecho pulse sequences of the brain and surrounding structures were obtained without intravenous contrast. COMPARISON:  MR Head 08/25/22, CT head 12/17/22 FINDINGS: Limitations: Motion degraded  exam. The susceptibility weighted images, FLAIR sequences, and the sagittal T1 weighted sequences are essentially nondiagnostic. Brain: Negative for an acute infarct. No extra-axial fluid collection. No hydrocephalus. There is sequela of at least moderate chronic microvascular ischemic change. Vascular: Normal flow voids. Skull and upper cervical spine: Markedly limited evaluation Sinuses/Orbits: Markedly limited evaluation, but no definite acute abnormality. Other: None. IMPRESSION: Markedly limited exam due to motion artifact. Within this limitation, no acute intracranial process. Electronically Signed   By: Lorenza Cambridge M.D.   On: 12/19/2022 14:28    Scheduled Meds:  atorvastatin  80 mg Oral QHS   cycloSPORINE  1 drop Both Eyes BID   divalproex  500 mg Oral BID   enoxaparin (LOVENOX) injection  40 mg Subcutaneous Q24H   escitalopram  20 mg Oral Daily   feeding supplement  237 mL Oral BID BM   lactulose  30 g Oral BID   lamoTRIgine  50 mg Oral BID   pantoprazole  40 mg Oral Daily   ticagrelor  90 mg Oral BID   Continuous Infusions:  dextrose 5 % and 0.9 % NaCl 100 mL/hr at 12/20/22 1627    LOS: 4 days   Shon Hale M.D on 12/20/2022 at 7:20 PM  Go to [email or web address] - for contact info  [facility] - Office  [phone]  If 7PM-7AM, please contact night-coverage [email or web address] 12/20/2022, 7:20 PM

## 2022-12-20 NOTE — TOC Progression Note (Signed)
Transition of Care [facility]) - Progression Note    Patient Details  Name: Melvin Taylor MRN: [id number] Date of Birth: 05-19-1966  [facility] CM/SW Contact  Elliot Gault, LCSW Phone Number: 12/20/2022, 10:24 AM  Clinical Narrative:     TOC following. Anticipating dc home with HH later today vs tomorrow per MD.   Referred out for [facility] as requested by family. Cory at [facility] accepted the referral. Information added to the AVS.  Will follow.  Expected Discharge Plan: Home w Home Health Services Barriers to Discharge: Continued Medical Work up   Patient Goals and CMS Choice Patient states their goals for this hospitalization and ongoing recovery are:: to go home CMS Medicare.gov Compare Post Acute Care list provided to:: Patient Represenative (must comment) Choice offered to / list presented to : Sibling  Expected Discharge Plan and Services Expected Discharge Plan: Home w Home Health Services In-house Referral: Clinical Social Work   Post Acute Care Choice: Home Health Living arrangements for the past 2 months: Apartment                           HH Arranged: PT HH Agency: [facility] Date [facility] Agency Contacted: 12/20/22   Representative spoke with at [facility] Agency: Kandee Keen  Prior Living Arrangements/Services Living arrangements for the past 2 months: Apartment Lives with:: Self Patient language and need for interpreter reviewed:: Yes Do you feel safe going back to the place where you live?: Yes      Need for Family Participation in Patient Care: Yes (Comment) Care giver support system in place?: Yes (comment)   Criminal Activity/Legal Involvement Pertinent to Current Situation/Hospitalization: No - Comment as needed  Activities of Daily Living Home Assistive Devices/Equipment: Shower chair with back, Environmental consultant (specify type) ADL Screening (condition at time of admission) Patient's cognitive ability adequate to safely complete daily activities?:  No Is the patient deaf or have difficulty hearing?: No Does the patient have difficulty seeing, even when wearing glasses/contacts?: No Does the patient have difficulty concentrating, remembering, or making decisions?: No Patient able to express need for assistance with ADLs?: No Does the patient have difficulty dressing or bathing?: Yes Independently performs ADLs?: No Communication: Needs assistance Is this a change from baseline?: Pre-admission baseline Dressing (OT): Needs assistance Is this a change from baseline?: Pre-admission baseline Grooming: Independent Feeding: Independent Bathing: Needs assistance Is this a change from baseline?: Pre-admission baseline Toileting: Needs assistance Is this a change from baseline?: Pre-admission baseline In/Out Bed: Needs assistance, Dependent Is this a change from baseline?: Pre-admission baseline Walks in Home: Needs assistance Is this a change from baseline?: Pre-admission baseline Does the patient have difficulty walking or climbing stairs?: Yes Weakness of Legs: Both Weakness of Arms/Hands: Both  Permission Sought/Granted Permission sought to share information with : Facility Industrial/product designer granted to share information with : Yes, Verbal Permission Granted     Permission granted to share info w AGENCY: [facility]        Emotional Assessment       Orientation: : Oriented to Self Alcohol / Substance Use: Not Applicable Psych Involvement: No (comment)  Admission diagnosis:  Hypotension [I95.9] Patient Active Problem List   Diagnosis Date Noted   Hypotension 12/16/2022   Protein-calorie malnutrition, severe 08/30/2022   Acute CVA (cerebrovascular accident) (HCC) 08/27/2022   Subdural hematoma (HCC) 08/27/2022   Seizure disorder (HCC) 08/27/2022   OSA (obstructive sleep apnea) 08/27/2022   Acute metabolic  encephalopathy 08/27/2022   Anemia due to stage 3b chronic kidney disease (HCC) 06/06/2022   Trigger  finger, left middle finger    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   Malnutrition of moderate degree 12/18/2020   Middle cerebral artery embolism, 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   Encounter for intubation    Renal insufficiency    Malignant hypertension    Acute ischemic left MCA stroke (HCC) 12/16/2020   Current smoker 06/21/2019   Seizure-like activity (HCC) 05/06/2019   Diabetes mellitus (HCC) 04/11/2019   Anemia, chronic disease 07/04/2018   Other specified diseases of the digestive system 10/30/2017   CAD (coronary artery disease) 03/28/2016   Chest pain 03/21/2016   Hyperlipidemia 03/11/2016   Ischemic chest pain [facility])    NSTEMI (non-ST elevated myocardial infarction) (HCC)    Coronary atherosclerosis of native coronary artery 03/03/2016   Cervical spinal stenosis 12/14/2015   Loss of weight 08/25/2015   Primary hypertension 07/23/2015   Personal history of noncompliance with medical treatment, presenting hazards to health 07/23/2015   Abdominal pain 06/08/2015   Constipation 06/08/2015   History of colonic polyps    Diverticulosis of colon without hemorrhage    Mucosal abnormality of stomach    Encounter for screening colonoscopy 04/16/2015   Dysphagia 04/16/2015   Partial symptomatic epilepsy with complex partial seizures, intractable, without status epilepticus (HCC) 11/10/2014   Bipolar disorder, unspecified (HCC) 10/15/2014   Schizophrenia (HCC) 10/15/2014   DM type 2 causing vascular disease (HCC) 06/29/2014   Musculoskeletal pain 06/29/2014   Hyperglycemia without ketosis 09/07/2013   Degeneration disease of medial meniscus 01/13/2011   Other internal derangements of unspecified knee 01/13/2011   Knee pain 12/28/2010   Medial meniscus, posterior horn derangement 12/28/2010   PCP:  [name], FNP Pharmacy:   [facility] [place] - [address] [place] [address] Phone: [phone] Fax:  [phone]  [facility] [place] - [address] [place] [address] Phone: [phone] Fax: [phone]     Social Determinants of Health (SDOH) Interventions    Readmission Risk Interventions     No data to display           Expected Discharge Plan: Home w Home Health Services Barriers to Discharge: Continued Medical Work up  Expected Discharge Plan and Services In-house Referral: Clinical Social Work   Post Acute Care Choice: Home Health Living arrangements for the past 2 months: Apartment                           HH Arranged: PT HH Agency: [facility] Date [facility] Agency Contacted: 12/20/22   Representative spoke with at [facility] Agency: Kandee Keen   Social Determinants of Health (SDOH) Interventions SDOH Screenings   Food Insecurity: No Food Insecurity (12/16/2022)  Housing: Low Risk  (12/16/2022)  Transportation Needs: No Transportation Needs (12/16/2022)  Utilities: Not At Risk (12/16/2022)  Depression (PHQ2-9): Low Risk  (11/01/2022)  Tobacco Use: Medium Risk (12/16/2022)    Readmission Risk Interventions     No data to display

## 2022-12-20 NOTE — Procedures (Addendum)
Patient Name: Melvin Taylor  MRN: [id number]  Epilepsy Attending: Charlsie Quest  Referring Physician/Provider: Shon Hale, MD  Date: 12/20/2022 Duration: 23.38 mins  Patient history:  56 y.o. male with medical history significant of DM2- not on medications, history of prior ischemic stroke and also prior subdural hemorrhage, hypertension, remote history of polysubstance abuse, now with weakness. EEG to evaluate for seizure.  Level of alertness: Awake  AEDs during EEG study: LTG, VPA  Technical aspects: This EEG study was done with scalp electrodes positioned according to the 10-20 International system of electrode placement. Electrical activity was reviewed with band pass filter of 1-70Hz , sensitivity of 7 uV/mm, display speed of 69mm/sec with a 60Hz  notched filter applied as appropriate. EEG data were recorded continuously and digitally stored.  Video monitoring was available and reviewed as appropriate.  Description: EEG showed continuous generalized 3 to 6 Hz theta-delta slowing. Hyperventilation and photic stimulation were not performed.     ABNORMALITY - Continuous slow, generalized  IMPRESSION: This study is suggestive of moderate diffuse encephalopathy, nonspecific etiology. No seizures or epileptiform discharges were seen throughout the recording.  Melvin Taylor

## 2022-12-20 NOTE — Consult Note (Signed)
I connected with  Melvin Taylor on 12/20/22 by a video enabled telemedicine application and verified that I am speaking with the correct person using two identifiers.   I discussed the limitations of evaluation and management by telemedicine. The patient expressed understanding and agreed to proceed.  Location of patient: [facility] Location of physician: [facility]  Neurology Consultation Reason for Consult: ams Referring Physician: Dr. Shon Hale  CC: ams  History is obtained from: Patient, chart review  HPI: Melvin Taylor is a 57 y.o. male with past medical history of stroke with residual right hemiparesis, epilepsy, bipolar disorder, coronary artery disease, hypertension, diabetes, chronic kidney disease and chronic anemia who was brought in on 5/70/2024 due to  hypotension.  Per chart review, patient was at the infusion center to get iron infusion and was noted to be hypotensive with systolic blood pressure in the 70s.  In the emergency room, workup revealed AKI, patient was given IV fluids and admitted for further workup.   Since being in the hospital, patient has been progressively confused, now not communicating as well as he does at baseline and needs two-person assist to walk.  Per sister, at baseline patient is alert, oriented, follows commands, is able to ambulate.  MRI brain was obtained which did not show any acute abnormality.  Therefore neurology was consulted for further recommendations.  Of note, patient was seen by neurology in January 2024 for persistent mutism which resolved spontaneously, suspected catatonia.   Outpatient notes were reviewed and summarized as follows.  Patient had onset of seizures around age 44-18.  Described aura loss of hearing, flashing lights followed by generalized convulsions, stiffness, tongue bite and urinary incontinence.  Reported he had failed Dilantin, phenobarbital and Keppra.  Currently on Depakote and  lamotrigine.  Epilepsy risk factors: Had head trauma while playing basketball and ran into a pole with?  loss of consciousness, great-aunt has history of epilepsy, denies febrile seizures or CNS infections.  Patient has had multiple routine EEG, ambulatory EEGs and an EMU admission.  In the past without ictal-interictal abnormalities on EEG.  ROS:  Unable to obtain due to altered mental status.   Past Medical History:  Diagnosis Date   Bipolar 1 disorder (HCC)    depression/anxiety   Bone spur    left heel   Cervical radiculopathy    Chronic back pain    Chronic chest wall pain    Chronic neck pain    Coronary artery disease    a. s/p CABG in 03/2016 with LIMA-LAD, SVG-D1, SVG-RCA, and Seq SVG-mid and distal OM   Diabetes mellitus    Type II   Hallucinations    "long history of them"   Headache(784.0)    History of gout    HTN (hypertension)    Insomnia    Neuropathy    Pain management    Polysubstance abuse (HCC)    Right leg pain    chronic   Schizophrenia (HCC)    Seizures (HCC)    last sz between July 5-9th, 2016; epilepsy   Sleep apnea    Stroke (HCC)    Transfusion of blood product refused for religious reason     Family History  Problem Relation Age of Onset   Diabetes Father    Hypertension Father    Sleep apnea Father    Stroke Sister    Arthritis Other    Diabetes Other    Asthma Other     Social History:  reports that he has quit smoking. His smoking use included cigarettes. He has a 15.00 pack-year smoking history. He has never used smokeless tobacco. He reports that he does not currently use alcohol. He reports that he does not currently use drugs after having used the following drugs: Marijuana.   Medications Prior to Admission  Medication Sig Dispense Refill Last Dose   acetaminophen (TYLENOL) 160 MG/5ML solution Place 20.3 mLs (650 mg total) into feeding tube every 4 (four) hours as needed for mild pain (or temp > 37.5 C (99.5 F)). 120 mL 0  unknown   albuterol (VENTOLIN HFA) 108 (90 Base) MCG/ACT inhaler Inhale 2 puffs into the lungs every 4 (four) hours as needed for wheezing or shortness of breath. 8 g 0 unknown   allopurinol (ZYLOPRIM) 100 MG tablet Take 100 mg by mouth daily.   12/16/2022   amLODipine (NORVASC) 10 MG tablet Take 10 mg by mouth daily.   12/16/2022   atorvastatin (LIPITOR) 80 MG tablet Take 1 tablet (80 mg total) by mouth at bedtime. 30 tablet 0 12/15/2022   divalproex (DEPAKOTE) 500 MG DR tablet Take 250 mg by mouth 2 (two) times daily.   12/16/2022   escitalopram (LEXAPRO) 20 MG tablet Take 1 tablet (20 mg total) by mouth daily. 30 tablet 0 12/16/2022   ferrous sulfate 325 (65 FE) MG tablet Take 325 mg by mouth daily.   12/16/2022   metoprolol tartrate (LOPRESSOR) 25 MG tablet Take 0.5 tablets (12.5 mg total) by mouth 2 (two) times daily. 60 tablet 0 12/16/2022 at AM   ticagrelor (BRILINTA) 90 MG TABS tablet Take 1 tablet (90 mg total) by mouth 2 (two) times daily. 60 tablet 5 12/16/2022   valsartan (DIOVAN) 40 MG tablet Take 20 mg by mouth daily.   12/16/2022   Vitamin D, Ergocalciferol, (DRISDOL) 1.25 MG (50000 UNIT) CAPS capsule Take 1 capsule (50,000 Units total) by mouth every 7 (seven) days. 5 capsule 0 12/12/2022   ACCU-CHEK GUIDE test strip USE AS DIRECTED TOoMONITOR BLOOD SUGAR (4)mTIMES DAILY.e      cycloSPORINE (RESTASIS) 0.05 % ophthalmic emulsion Place 1 drop into both eyes 2 (two) times daily. (Patient not taking: Reported on 12/18/2022) 0.4 mL 0 Not Taking   isosorbide mononitrate (IMDUR) 30 MG 24 hr tablet Take 1 tablet (30 mg total) by mouth daily. (Patient not taking: Reported on 12/18/2022) 90 tablet 3 Not Taking   lamoTRIgine (LAMICTAL) 25 MG tablet TAKE 2 TABLETS BY MOUTH TWICE DAILY. (Patient not taking: Reported on 12/18/2022) 360 tablet 1 Not Taking   Multiple Vitamin (MULTIVITAMIN WITH MINERALS) TABS tablet Take 1 tablet by mouth daily.   12/16/2022   pantoprazole (PROTONIX) 40 MG tablet Take 40 mg by  mouth daily. (Patient not taking: Reported on 12/18/2022)   Not Taking      Exam: Current vital signs: BP (!) 124/93   Pulse 88   Temp 97.9 F (36.6 C)   Resp 18   Ht 5\' 7"  (1.702 m)   Wt 54.7 kg   SpO2 100%   BMI 18.89 kg/m  Vital signs in last 24 hours: Temp:  [97.8 F (36.6 C)-98.3 F (36.8 C)] 97.9 F (36.6 C) (05/21 0534) Pulse Rate:  [82-88] 88 (05/20 2023) Resp:  [16-20] 18 (05/21 0534) BP: (124-158)/(82-107) 124/93 (05/21 0534) SpO2:  [96 %-100 %] 100 % (05/21 0534)   Physical Exam  Constitutional: Appears well-developed and well-nourished.  Neuro: Awake, alert, able to tell me his name, could not tell me  where he is, after giving 3 choices said he is at home.  However when I said he is at the hospital, laughed said " oh yes, I know I am not at home", not oriented to time, did not follow commands, pupils appear appear round and equally reactive, extraocular movements appear intact, spontaneously moving left upper and lower extremity in bed, right hemiparesis  I have reviewed labs in epic and the results pertinent to this consultation are: CBC:  Recent Labs  Lab 12/16/22 1405 12/17/22 0529 12/20/22 0818  WBC 5.7 4.5 4.9  NEUTROABS 1.6*  --   --   HGB 8.7* 8.3* 8.6*  HCT 26.6* 25.2* 26.1*  MCV 94.3 94.0 91.6  PLT 274 253 239    Basic Metabolic Panel:  Lab Results  Component Value Date   NA 136 12/20/2022   K 4.2 12/20/2022   CO2 25 12/20/2022   GLUCOSE 95 12/20/2022   BUN 8 12/20/2022   CREATININE 1.27 (H) 12/20/2022   CALCIUM 8.6 (L) 12/20/2022   GFRNONAA >60 12/20/2022   GFRAA 51 08/25/2020   Lipid Panel:  Lab Results  Component Value Date   LDLCALC 46 08/28/2022   HgbA1c:  Lab Results  Component Value Date   HGBA1C 6.0 (H) 12/17/2022   Urine Drug Screen:     Component Value Date/Time   LABOPIA NONE DETECTED 12/17/2020 0254   COCAINSCRNUR NONE DETECTED 12/17/2020 0254   LABBENZ NONE DETECTED 12/17/2020 0254   AMPHETMU NONE DETECTED  12/17/2020 0254   THCU POSITIVE (A) 12/17/2020 0254   LABBARB NONE DETECTED 12/17/2020 0254    Alcohol Level     Component Value Date/Time   ETH <10 12/16/2020 2028     I have reviewed the images obtained:  CT Head without contrast 12/17/2022: No acute intracranial abnormality. Chronic left MCA distribution infarcts involving the left frontoparietal region and left basal ganglia.  Vascular stent in place within the left M1 segment. Underlying mildly advanced cerebral atrophy with chronic small vessel ischemic disease.  MRI Brain wo contrast 12/19/2022 : Markedly limited exam due to motion artifact. Within this limitation, no acute intracranial process.  rEEG 5/0/2024: This study is suggestive of moderate diffuse encephalopathy, nonspecific etiology. No seizures or epileptiform discharges were seen throughout the recording.   ASSESSMENT/PLAN: 57 year old male with multiple medical comorbidities as noted above who was admitted due to hypotension but has gradually been more confused and has generalized weakness.  Acute encephalopathy Chronic stroke Epilepsy Hyperammonemia -Acute encephalopathy likely toxic metabolic, versus delirium. -Hyperammonemia likely due to Depakote  Recommendations: -Confirm Depakote and lamotrigine doses with sister.  Continue Depakote 500 mg twice daily and lamotrigine 50 mg twice daily for now -Agree with lactulose for hyperammonemia.  However if hyperammonemia persists, can consider adding levocarnitine.  Hesitant to reduce Depakote because patient has not had seizures in about 2 years per sister on phone -Will check vitamin B12, MMA, folate, TSH, thiamine for reversible causes of encephalopathy.  Sister does report adequate food intake therefore will hold off on starting thiamine replacement  -If patient mental status worsens, can consider transfer to [facility] for MRI brain with lorazepam and video EEG -Discussed plan with sister on phone, Dr. Mariea Clonts via  secure chat  Thank you for allowing [place] to participate in the care of this patient. If you have any further questions, please contact  me or neurohospitalist.   Lindie Spruce Epilepsy [facility] neuro[facility]ist

## 2022-12-21 DIAGNOSIS — E1159 Type 2 diabetes mellitus with other circulatory complications: Secondary | ICD-10-CM

## 2022-12-21 DIAGNOSIS — I9589 Other hypotension: Secondary | ICD-10-CM | POA: Diagnosis not present

## 2022-12-21 DIAGNOSIS — G40909 Epilepsy, unspecified, not intractable, without status epilepticus: Secondary | ICD-10-CM | POA: Diagnosis not present

## 2022-12-21 DIAGNOSIS — E43 Unspecified severe protein-calorie malnutrition: Secondary | ICD-10-CM | POA: Diagnosis not present

## 2022-12-21 LAB — GLUCOSE, CAPILLARY
Glucose-Capillary: 165 mg/dL — ABNORMAL HIGH (ref 70–99)
Glucose-Capillary: 166 mg/dL — ABNORMAL HIGH (ref 70–99)
Glucose-Capillary: 180 mg/dL — ABNORMAL HIGH (ref 70–99)
Glucose-Capillary: 84 mg/dL (ref 70–99)
Glucose-Capillary: 112 mg/dL — ABNORMAL HIGH (ref 70–99)

## 2022-12-21 LAB — BASIC METABOLIC PANEL
Anion gap: 7 (ref 5–15)
BUN: 7 mg/dL (ref 6–20)
CO2: 25 mmol/L (ref 22–32)
Calcium: 8.4 mg/dL — ABNORMAL LOW (ref 8.9–10.3)
Chloride: 103 mmol/L (ref 98–111)
Creatinine, Ser: 1.13 mg/dL (ref 0.61–1.24)
GFR, Estimated: >60 mL/min (ref >60)
Glucose, Bld: 107 mg/dL — ABNORMAL HIGH (ref 70–99)
Potassium: 3.9 mmol/L (ref 3.5–5.1)
Sodium: 135 mmol/L (ref 135–145)

## 2022-12-21 LAB — CULTURE, BLOOD (ROUTINE X 2)
Culture: NO GROWTH
Special Requests: ADEQUATE

## 2022-12-21 LAB — AMMONIA: Ammonia: 30 umol/L (ref 9–35)

## 2022-12-21 MED ORDER — LACTULOSE 10 GM/15ML PO SOLN: 20.0000 g | Freq: Two times a day (BID) | ORAL | 2 refills | Status: DC

## 2022-12-21 MED ORDER — ENSURE ENLIVE PO LIQD: 237.0000 mL | Freq: Two times a day (BID) | ORAL | 1 refills | Status: AC

## 2022-12-21 MED ORDER — CYCLOSPORINE 0.05 % OP EMUL: 1.0000 [drp] | Freq: Two times a day (BID) | OPHTHALMIC | 0 refills | Status: DC | PRN

## 2022-12-21 NOTE — TOC Transition Note (Signed)
Transition of Care [facility]) - CM/SW Discharge Note   Patient Details  Name: Melvin Taylor MRN: [id number] Date of Birth: January 26, 1966  [facility] CM/SW Contact:  Leitha Bleak, RN Phone Number: 12/21/2022, 11:49 AM   Clinical Narrative:   Patient discharging home with home health. Cory updated, order are in.    Final next level of care: Home w Home Health Services Barriers to Discharge: Barriers Resolved   Patient Goals and CMS Choice CMS Medicare.gov Compare Post Acute Care list provided to:: Patient Represenative (must comment) Choice offered to / list presented to : Sibling  Discharge Placement       Patient and family notified of of transfer: 12/21/22  Discharge Plan and Services Additional resources added to the After Visit Summary for   In-house Referral: Clinical Social Work   Post Acute Care Choice: Home Health                HH Arranged: [facility] Agency: [facility] Date [facility] Agency Contacted: 12/20/22   Representative spoke with at [facility] Agency: Kandee Keen  Social Determinants of Health (SDOH) Interventions SDOH Screenings   Food Insecurity: No Food Insecurity (12/16/2022)  Housing: Low Risk  (12/16/2022)  Transportation Needs: No Transportation Needs (12/16/2022)  Utilities: Not At Risk (12/16/2022)  Depression (PHQ2-9): Low Risk  (11/01/2022)  Tobacco Use: Medium Risk (12/16/2022)     Readmission Risk Interventions    12/21/2022   11:48 AM  Readmission Risk Prevention Plan  Transportation Screening Complete  PCP or Specialist Appt within 3-5 Days Complete  HRI or Home Care Consult Complete  Social Work Consult for Recovery Care Planning/Counseling Complete  Palliative Care Screening Not Applicable  Medication Review [employer]) Complete

## 2022-12-21 NOTE — Discharge Instructions (Signed)
IMPORTANT INFORMATION: PAY CLOSE ATTENTION   PHYSICIAN DISCHARGE INSTRUCTIONS  Follow with Primary care provider  McCorkle, Tenika, FNP  and other consultants as instructed by your Hospitalist Physician  SEEK MEDICAL CARE OR RETURN TO EMERGENCY ROOM IF SYMPTOMS COME BACK, WORSEN OR NEW PROBLEM DEVELOPS   Please note: You were cared for by a hospitalist during your hospital stay. Every effort will be made to forward records to your primary care provider.  You can request that your primary care provider send for your hospital records if they have not received them.  Once you are discharged, your primary care physician will handle any further medical issues. Please note that NO REFILLS for any discharge medications will be authorized once you are discharged, as it is imperative that you return to your primary care physician (or establish a relationship with a primary care physician if you do not have one) for your post hospital discharge needs so that they can reassess your need for medications and monitor your lab values.  Please get a complete blood count and chemistry panel checked by your Primary MD at your next visit, and again as instructed by your Primary MD.  Get Medicines reviewed and adjusted: Please take all your medications with you for your next visit with your Primary MD  Laboratory/radiological data: Please request your Primary MD to go over all hospital tests and procedure/radiological results at the follow up, please ask your primary care provider to get all Hospital records sent to his/her office.  In some cases, they will be blood work, cultures and biopsy results pending at the time of your discharge. Please request that your primary care provider follow up on these results.  If you are diabetic, please bring your blood sugar readings with you to your follow up appointment with primary care.    Please call and make your follow up appointments as soon as possible.    Also  Note the following: If you experience worsening of your admission symptoms, develop shortness of breath, life threatening emergency, suicidal or homicidal thoughts you must seek medical attention immediately by calling 911 or calling your MD immediately  if symptoms less severe.  You must read complete instructions/literature along with all the possible adverse reactions/side effects for all the Medicines you take and that have been prescribed to you. Take any new Medicines after you have completely understood and accpet all the possible adverse reactions/side effects.   Do not drive when taking Pain medications or sleeping medications (Benzodiazepines)  Do not take more than prescribed Pain, Sleep and Anxiety Medications. It is not advisable to combine anxiety,sleep and pain medications without talking with your primary care practitioner  Special Instructions: If you have smoked or chewed Tobacco  in the last 2 yrs please stop smoking, stop any regular Alcohol  and or any Recreational drug use.  Wear Seat belts while driving.  Do not drive if taking any narcotic, mind altering or controlled substances or recreational drugs or alcohol.        

## 2022-12-21 NOTE — Discharge Summary (Signed)
Physician Discharge Summary  Melvin Taylor ZOX:[id number] DOB: April 10, 1966 DOA: 12/16/2022  PCP: [name], FNP Neurologist: Marjory Lies Admit date: 12/16/2022 Discharge date: 12/21/2022  Admitted From:  Home  Disposition:  Home with HH (family declines SNF)  Recommendations for Outpatient Follow-up:  Follow up with PCP in 1 weeks Please follow up with neurologist in 2 weeks  Please follow up on the following pending results: vitamin B12, MMA, folate, TSH, thiamine   Home Health:  PT, Family providing 24/7 supervision  Discharge Condition: STABLE   CODE STATUS: FULL DIET: soft foods, assist with feeding, aspiration precautions    Brief Hospitalization Summary: Please see all hospital notes, images, labs for full details of the hospitalization. ADMISSION PROVIDER HPI:  57 y.o. male with medical history significant of DM2- not on medications, history of prior ischemic stroke and also prior subdural hemorrhage, hypertension, remote history of polysubstance abuse,  Patient has chronic anemia due to chronic kidney disease admitted on 12/16/2022 with hypotension subsequently developed hypoglycemia on acute metabolic encephalopathy.  He was noted to have elevated ammonia levels likely secondary to depakote usage.  He was started on oral lactulose with subsequent ammonia level coming down to normal.  He was evaluated by neurologist and we looked for reversible causes for his encephalopathy.  None was found other than the elevated ammonia levels.  He was maintained on depakote and lamictal and had no seizure activity and EEG did not show seizure activity.  PT had recommended SNF placement but family declined and agreed to provide 24/7 supervision and care, but was agreeable to home health.  Pt is being discharged home with home health in stable condition.    1) acute metabolic encephalopathy--- patient's dad sister and is concerned that his mentation is not back to baseline -Patient has extensive  neuro history including history of subdural hematoma, remote infarcts left basal ganglia, anterior left frontal lobe and corona radiata, status post prior left MCA stent placement -Patient does have right-sided weakness which is not new 12/20/22 -Much Less lethargic...... patient's sister does not believe that he is  quite back to baseline yet -CT head without New acute findings -MRI brain from 12/19/2022 without acute findings (limited by motion artifact) -Depakote level is therapeutic -Ammonia was borderline high at 44 >>53>>30 -mentation much improved with lactulose -- DC home with [facility] and continue lactulose at home  -EEG--without epileptiform findings -Use lorazepam as needed seizures--as per family members no recent seizures or over the last 18 to 24 months or so, as patient has been more compliant with antiepileptics lately -Please see full neurology consult dated 12/20/2022 -Pt improved to baseline after lactulose treatments and ammonia down to 30.  DC home with [facility] today.  --Dr. Melynda Ripple the neurologist called and updated patient's HCPOA   2)Hypotension--- suspect due to dehydration/volume depletion -BP improved with IV fluids =-Holding valsartan and metoprolol due to soft BPs, amlodipine restarted at DC    3)History of prior ischemic stroke/H/o prior subdural hemorrhage---status post prior left MCA stent placement -Continue atorvastatin, continue Brilinta for secondary stroke prophylaxis   4)H/o SZ--please see #1 above -Therapeutic Depakote level noted -Continue Lamictal and Depakote -May use IV lorazepam as needed  EEG-without epileptiform findings epileptiform findings   5)Hypoglycemia--- briefly treated with IV dextrose infusion; now is eating and drinking -Patient with remote history of DM but not on medications for this -A1c 6.0 reflecting excellent diabetic control PTA -Per family patient has history of recurrent hypoglycemia so he has to eat about every 3  hours at home and  typically he gets a late snack before going to bed   6) chronic anemia with iron deficiency--- patient received Aranesp and iron infusion -Hgb stable currently above 8 despite IV fluids/hemodilution -B12 and folate are not low   7)AKI----acute kidney injury due to dehydration and hypotension compounded by valsartan/benazepril use -Creatinine is down to 1.27 from 2.4 on admission with IVF -hydration -Further review of records shows creatinine of 1.11 on 09/09/2022 and creatinine of 1.12 on 09/07/2022  renally adjusted medications, avoid nephrotoxic agents / dehydration  / hypotension   8)Generalized weakness and deconditioning--- PT eval appreciated recommends SNF rehab  -Patient's HCPOA is considering possible discharge home with home health and has declined SNF, will provide 24/7 supervision and care at home.    Status is: Inpatient   Discharge Diagnoses:  Principal Problem:   Hypotension Active Problems:   CAD (coronary artery disease)   Bipolar disorder, unspecified (HCC)   Diabetes mellitus (HCC)   Renal insufficiency   Anemia due to stage 3b chronic kidney disease (HCC)   Seizure disorder (HCC)   Protein-calorie malnutrition, severe   Discharge Instructions: Discharge Instructions     Ambulatory referral to Neurology   Complete by: As directed    An appointment is requested in approximately: 2 weeks      Allergies as of 12/21/2022       Reactions   Aspirin Other (See Comments), Swelling, Itching   Facial swelling Facial swelling , Reaction:  Facial swelling        Medication List     STOP taking these medications    isosorbide mononitrate 30 MG 24 hr tablet Commonly known as: IMDUR   metoprolol tartrate 25 MG tablet Commonly known as: LOPRESSOR   valsartan 40 MG tablet Commonly known as: DIOVAN       TAKE these medications    Accu-Chek Guide test strip Generic drug: glucose blood USE AS DIRECTED TOoMONITOR BLOOD SUGAR (4)mTIMES DAILY.e    acetaminophen 160 MG/5ML solution Commonly known as: TYLENOL Place 20.3 mLs (650 mg total) into feeding tube every 4 (four) hours as needed for mild pain (or temp > 37.5 C (99.5 F)).   albuterol 108 (90 Base) MCG/ACT inhaler Commonly known as: VENTOLIN HFA Inhale 2 puffs into the lungs every 4 (four) hours as needed for wheezing or shortness of breath.   allopurinol 100 MG tablet Commonly known as: ZYLOPRIM Take 100 mg by mouth daily.   amLODipine 10 MG tablet Commonly known as: NORVASC Take 10 mg by mouth daily.   atorvastatin 80 MG tablet Commonly known as: LIPITOR Take 1 tablet (80 mg total) by mouth at bedtime.   cycloSPORINE 0.05 % ophthalmic emulsion Commonly known as: RESTASIS Place 1 drop into both eyes 2 (two) times daily as needed. What changed:  when to take this reasons to take this   divalproex 250 MG DR tablet Commonly known as: DEPAKOTE Take 250 mg by mouth 2 (two) times daily.   escitalopram 20 MG tablet Commonly known as: LEXAPRO Take 1 tablet (20 mg total) by mouth daily.   feeding supplement Liqd Take 237 mLs by mouth 2 (two) times daily between meals.   ferrous sulfate 325 (65 FE) MG tablet Take 325 mg by mouth daily.   lactulose 10 GM/15ML solution Commonly known as: CHRONULAC Take 30 mLs (20 g total) by mouth 2 (two) times daily.   lamoTRIgine 25 MG tablet Commonly known as: LAMICTAL TAKE 2 TABLETS BY MOUTH TWICE DAILY.  multivitamin with minerals Tabs tablet Take 1 tablet by mouth daily.   pantoprazole 40 MG tablet Commonly known as: PROTONIX Take 40 mg by mouth daily.   ticagrelor 90 MG Tabs tablet Commonly known as: BRILINTA Take 1 tablet (90 mg total) by mouth 2 (two) times daily.   Vitamin D (Ergocalciferol) 1.25 MG (50000 UNIT) Caps capsule Commonly known as: DRISDOL Take 1 capsule (50,000 Units total) by mouth every 7 (seven) days.        Follow-up Information     Care, [facility] Follow up.   Specialty:  Home Health Services Why: [facility] will call to schedule in home therapy visits Contact information: [address] [place] [address] [phone]         [name], FNP. Schedule an appointment as soon as possible for a visit in 1 week(s).   Specialty: Family Medicine Why: Hospital Follow Up Contact information: [address] [place] [address] [phone]         Suanne Marker, MD. Schedule an appointment as soon as possible for a visit in 2 week(s).   Specialties: Neurology, Radiology Why: Hospital Follow Up Contact information: [address] [place] [address] [phone]                Allergies  Allergen Reactions   Aspirin Other (See Comments), Swelling and Itching    Facial swelling  Facial swelling , Reaction:  Facial swelling   Allergies as of 12/21/2022       Reactions   Aspirin Other (See Comments), Swelling, Itching   Facial swelling Facial swelling , Reaction:  Facial swelling        Medication List     STOP taking these medications    isosorbide mononitrate 30 MG 24 hr tablet Commonly known as: IMDUR   metoprolol tartrate 25 MG tablet Commonly known as: LOPRESSOR   valsartan 40 MG tablet Commonly known as: DIOVAN       TAKE these medications    Accu-Chek Guide test strip Generic drug: glucose blood USE AS DIRECTED TOoMONITOR BLOOD SUGAR (4)mTIMES DAILY.e   acetaminophen 160 MG/5ML solution Commonly known as: TYLENOL Place 20.3 mLs (650 mg total) into feeding tube every 4 (four) hours as needed for mild pain (or temp > 37.5 C (99.5 F)).   albuterol 108 (90 Base) MCG/ACT inhaler Commonly known as: VENTOLIN HFA Inhale 2 puffs into the lungs every 4 (four) hours as needed for wheezing or shortness of breath.   allopurinol 100 MG tablet Commonly known as: ZYLOPRIM Take 100 mg by mouth daily.   amLODipine 10 MG tablet Commonly known as:  NORVASC Take 10 mg by mouth daily.   atorvastatin 80 MG tablet Commonly known as: LIPITOR Take 1 tablet (80 mg total) by mouth at bedtime.   cycloSPORINE 0.05 % ophthalmic emulsion Commonly known as: RESTASIS Place 1 drop into both eyes 2 (two) times daily as needed. What changed:  when to take this reasons to take this   divalproex 250 MG DR tablet Commonly known as: DEPAKOTE Take 250 mg by mouth 2 (two) times daily.   escitalopram 20 MG tablet Commonly known as: LEXAPRO Take 1 tablet (20 mg total) by mouth daily.   feeding supplement Liqd Take 237 mLs by mouth 2 (two) times daily between meals.   ferrous sulfate 325 (65 FE) MG tablet Take 325 mg by mouth daily.   lactulose 10 GM/15ML solution Commonly known  as: CHRONULAC Take 30 mLs (20 g total) by mouth 2 (two) times daily.   lamoTRIgine 25 MG tablet Commonly known as: LAMICTAL TAKE 2 TABLETS BY MOUTH TWICE DAILY.   multivitamin with minerals Tabs tablet Take 1 tablet by mouth daily.   pantoprazole 40 MG tablet Commonly known as: PROTONIX Take 40 mg by mouth daily.   ticagrelor 90 MG Tabs tablet Commonly known as: BRILINTA Take 1 tablet (90 mg total) by mouth 2 (two) times daily.   Vitamin D (Ergocalciferol) 1.25 MG (50000 UNIT) Caps capsule Commonly known as: DRISDOL Take 1 capsule (50,000 Units total) by mouth every 7 (seven) days.        Procedures/Studies: EEG adult  Result Date: 21-Dec-2022 Charlsie Quest, MD     12-21-22  8:54 AM Patient Name: Melvin Taylor MRN: [id number] Epilepsy Attending: Charlsie Quest Referring Physician/Provider: Shon Hale, MD Date: Dec 21, 2022 Duration: 23.38 mins Patient history:  57 y.o. male with medical history significant of DM2- not on medications, history of prior ischemic stroke and also prior subdural hemorrhage, hypertension, remote history of polysubstance abuse, now with weakness. EEG to evaluate for seizure. Level of alertness: Awake AEDs during EEG  study: LTG, VPA Technical aspects: This EEG study was done with scalp electrodes positioned according to the 10-20 International system of electrode placement. Electrical activity was reviewed with band pass filter of 1-70Hz , sensitivity of 7 uV/mm, display speed of 30mm/sec with a 60Hz  notched filter applied as appropriate. EEG data were recorded continuously and digitally stored.  Video monitoring was available and reviewed as appropriate. Description: EEG showed continuous generalized 3 to 6 Hz theta-delta slowing. Hyperventilation and photic stimulation were not performed.   ABNORMALITY - Continuous slow, generalized IMPRESSION: This study is suggestive of moderate diffuse encephalopathy, nonspecific etiology. No seizures or epileptiform discharges were seen throughout the recording. Charlsie Quest   MR BRAIN WO CONTRAST  Result Date: 12/19/2022 CLINICAL DATA:  Mental status change, unknown cause EXAM: MRI HEAD WITHOUT CONTRAST TECHNIQUE: Multiplanar, multiecho pulse sequences of the brain and surrounding structures were obtained without intravenous contrast. COMPARISON:  MR Head 08/25/22, CT head 12/17/22 FINDINGS: Limitations: Motion degraded exam. The susceptibility weighted images, FLAIR sequences, and the sagittal T1 weighted sequences are essentially nondiagnostic. Brain: Negative for an acute infarct. No extra-axial fluid collection. No hydrocephalus. There is sequela of at least moderate chronic microvascular ischemic change. Vascular: Normal flow voids. Skull and upper cervical spine: Markedly limited evaluation Sinuses/Orbits: Markedly limited evaluation, but no definite acute abnormality. Other: None. IMPRESSION: Markedly limited exam due to motion artifact. Within this limitation, no acute intracranial process. Electronically Signed   By: Lorenza Cambridge M.D.   On: 12/19/2022 14:28   CT HEAD WO CONTRAST ( )  Result Date: 12/17/2022 CLINICAL DATA:  Initial evaluation for mental status change,  unknown cause. EXAM: CT HEAD WITHOUT CONTRAST TECHNIQUE: Contiguous axial images were obtained from the base of the skull through the vertex without intravenous contrast. RADIATION DOSE REDUCTION: This exam was performed according to the departmental dose-optimization program which includes automated exposure control, adjustment of the mA and/or kV according to patient size and/or use of iterative reconstruction technique. COMPARISON:  CT from 08/28/2022. FINDINGS: Brain: Mildly advanced cerebral atrophy with chronic small vessel ischemic disease. Remote lacunar infarcts present at the left basal ganglia. Additional small remote cortical infarcts involving the posterior left frontoparietal region. No acute intracranial hemorrhage. No acute large vessel territory infarct. No mass lesion or midline shift. No hydrocephalus or extra-axial fluid  collection. Vascular: No abnormal hyperdense vessel. Vascular stent in place within the left M1 segment. Scattered vascular calcifications noted within the carotid siphons. Skull: Scalp soft tissues demonstrate no acute finding. Calvarium intact. Sinuses/Orbits: Globes orbital soft tissues within normal limits. Visualized paranasal sinuses are largely clear. No mastoid effusion. Other: None. IMPRESSION: 1. No acute intracranial abnormality. 2. Chronic left MCA distribution infarcts involving the left frontoparietal region and left basal ganglia. 3. Vascular stent in place within the left M1 segment. 4. Underlying mildly advanced cerebral atrophy with chronic small vessel ischemic disease. Electronically Signed   By: Rise Mu M.D.   On: 12/17/2022 20:20   DG Chest Port 1 View  Result Date: 12/16/2022 CLINICAL DATA:  Hypotension EXAM: PORTABLE CHEST 1 VIEW COMPARISON:  08/27/2022 FINDINGS: Interval removal of previously noted PICC line. Status post median sternotomy. Unchanged cardiac and mediastinal contours. No focal pulmonary opacity. No pleural effusion or  pneumothorax. No acute osseous abnormality. IMPRESSION: No active disease. Electronically Signed   By: Wiliam Ke M.D.   On: 12/16/2022 14:48     Subjective: No specific complaints, seems to be back to his baseline now and eager to go home, lives with sister.   Discharge Exam: Vitals:   12/20/22 2021 12/21/22 0536  BP: (!) 165/99 (!) 136/96  Pulse: 88 81  Resp: 19 18  Temp: 98.2 F (36.8 C) 98.1 F (36.7 C)  SpO2: 100% 100%   Vitals:   12/20/22 0534 12/20/22 1845 12/20/22 2021 12/21/22 0536  BP: (!) 124/93 (!) 144/104 (!) 165/99 (!) 136/96  Pulse:  85 88 81  Resp: 18 18 19 18   Temp: 97.9 F (36.6 C) 99.2 F (37.3 C) 98.2 F (36.8 C) 98.1 F (36.7 C)  TempSrc:  Oral Oral   SpO2: 100% 100% 100% 100%  Weight:      Height:       General: Pt is alert, awake, not in acute distress Cardiovascular: normal S1/S2 +, no rubs, no gallops Respiratory: CTA bilaterally, no wheezing, no rhonchi Abdominal: Soft, NT, ND, bowel sounds + Extremities: no edema, no cyanosis   The results of significant diagnostics from this hospitalization (including imaging, microbiology, ancillary and laboratory) are listed below for reference.     Microbiology: Recent Results (from the past 240 hour(s))  Culture, blood (Routine x 2)     Status: None   Collection Time: 12/16/22  2:05 PM   Specimen: BLOOD  Result Value Ref Range Status   Specimen Description BLOOD BLOOD LEFT ARM  Final   Special Requests   Final    BOTTLES DRAWN AEROBIC AND ANAEROBIC Blood Culture adequate volume   Culture   Final    NO GROWTH 5 DAYS Performed at [facility], [address] [place], [place] [address]    Report Status 12/21/2022 FINAL  Final  Culture, blood (Routine x 2)     Status: None   Collection Time: 12/16/22  2:15 PM   Specimen: BLOOD  Result Value Ref Range Status   Specimen Description BLOOD BLOOD RIGHT HAND  Final   Special Requests   Final    BOTTLES DRAWN AEROBIC AND ANAEROBIC Blood Culture  adequate volume   Culture   Final    NO GROWTH 5 DAYS Performed at [facility], [address], [place], [place] [address]    Report Status 12/21/2022 FINAL  Final     Labs: BNP (last 3 results) No results for input(s): "BNP" in the last 8760 hours. Basic Metabolic Panel: Recent  Labs  Lab 12/17/22 0526 12/18/22 0451 12/19/22 0553 12/20/22 0818 12/21/22 0935  NA 137 136 137 136 135  K 3.6 3.5 4.3 4.2 3.9  CL 107 103 106 106 103  CO2 24 25 26 25 25   GLUCOSE 61* 70 65* 95 107*  BUN 17 11 7 8 7   CREATININE 1.41* 1.17 1.29* 1.27* 1.13  CALCIUM 8.6* 8.7* 8.7* 8.6* 8.4*  PHOS  --   --   --  4.3  --    Liver Function Tests: Recent Labs  Lab 12/16/22 1405 12/20/22 0818  AST 24  --   ALT 13  --   ALKPHOS 45  --   BILITOT 0.6  --   PROT 6.1*  --   ALBUMIN 3.2* 2.9*   No results for input(s): "LIPASE", "AMYLASE" in the last 168 hours. Recent Labs  Lab 12/18/22 1157 12/20/22 0818 12/21/22 0935  AMMONIA 44* 53* 30   CBC: Recent Labs  Lab 12/16/22 1405 12/17/22 0529 12/20/22 0818  WBC 5.7 4.5 4.9  NEUTROABS 1.6*  --   --   HGB 8.7* 8.3* 8.6*  HCT 26.6* 25.2* 26.1*  MCV 94.3 94.0 91.6  PLT 274 253 239   Cardiac Enzymes: No results for input(s): "CKTOTAL", "CKMB", "CKMBINDEX", "TROPONINI" in the last 168 hours. BNP: Invalid input(s): "POCBNP" CBG: Recent Labs  Lab 12/20/22 1115 12/20/22 1818 12/20/22 1848 12/20/22 2349 12/21/22 0529  GLUCAP 75 166* 180* 165* 84   D-Dimer No results for input(s): "DDIMER" in the last 72 hours. Hgb A1c No results for input(s): "HGBA1C" in the last 72 hours. Lipid Profile No results for input(s): "CHOL", "HDL", "LDLCALC", "TRIG", "CHOLHDL", "LDLDIRECT" in the last 72 hours. Thyroid function studies Recent Labs    12/20/22 1233  TSH 2.367   Anemia work up Recent Labs    12/20/22 1233  VITAMINB12 507  FOLATE 8.7   Urinalysis    Component Value Date/Time   COLORURINE COLORLESS (A) 12/16/2022 1705    APPEARANCEUR CLEAR 12/16/2022 1705   LABSPEC 1.004 (L) 12/16/2022 1705   PHURINE 6.0 12/16/2022 1705   GLUCOSEU NEGATIVE 12/16/2022 1705   HGBUR NEGATIVE 12/16/2022 1705   BILIRUBINUR NEGATIVE 12/16/2022 1705   KETONESUR NEGATIVE 12/16/2022 1705   PROTEINUR NEGATIVE 12/16/2022 1705   UROBILINOGEN 0.2 10/15/2014 1430   NITRITE NEGATIVE 12/16/2022 1705   LEUKOCYTESUR NEGATIVE 12/16/2022 1705   Sepsis Labs Recent Labs  Lab 12/16/22 1405 12/17/22 0529 12/20/22 0818  WBC 5.7 4.5 4.9   Microbiology Recent Results (from the past 240 hour(s))  Culture, blood (Routine x 2)     Status: None   Collection Time: 12/16/22  2:05 PM   Specimen: BLOOD  Result Value Ref Range Status   Specimen Description BLOOD BLOOD LEFT ARM  Final   Special Requests   Final    BOTTLES DRAWN AEROBIC AND ANAEROBIC Blood Culture adequate volume   Culture   Final    NO GROWTH 5 DAYS Performed at [facility], [address], [place], [place] [address]    Report Status 12/21/2022 FINAL  Final  Culture, blood (Routine x 2)     Status: None   Collection Time: 12/16/22  2:15 PM   Specimen: BLOOD  Result Value Ref Range Status   Specimen Description BLOOD BLOOD RIGHT HAND  Final   Special Requests   Final    BOTTLES DRAWN AEROBIC AND ANAEROBIC Blood Culture adequate volume   Culture   Final    NO GROWTH 5 DAYS Performed  at [facility], [address] [place], [place] [address]    Report Status 12/21/2022 FINAL  Final   Time coordinating discharge: 38 mins   SIGNED:  Standley Dakins, MD  [facility] 12/21/2022, 11:05 AM How to contact the [facility] Attending or Consulting provider 7A - 7P or covering provider during after hours 7P -7A, for this patient?  Check the care team in [facility] and look for a) attending/consulting [facility] provider listed and b) the [place] team listed Log into [email or web address] and use [facility] universal password to access. If you do not have the password, please contact the hospital  operator. Locate the [facility] provider you are looking for under [facility] and page to a number that you can be directly reached. If you still have difficulty reaching the provider, please page the [facility] (Director on Call) for the Hospitalists listed on amion for assistance.

## 2022-12-22 ENCOUNTER — Encounter (HOSPITAL_COMMUNITY)
Admission: RE | Admit: 2022-12-22 | Discharge: 2022-12-22 | Disposition: A | Discharge status: Home / Self Care | Payer: Medicaid Other | Source: Ambulatory Visit | Attending: Nephrology | Admitting: Nephrology

## 2022-12-22 VITALS — BP 127/90 | HR 77 | Temp 98.2°F | Resp 16

## 2022-12-22 DIAGNOSIS — D631 Anemia in chronic kidney disease: Secondary | ICD-10-CM | POA: Diagnosis present

## 2022-12-22 DIAGNOSIS — D638 Anemia in other chronic diseases classified elsewhere: Secondary | ICD-10-CM | POA: Diagnosis present

## 2022-12-22 DIAGNOSIS — N1832 Chronic kidney disease, stage 3b: Secondary | ICD-10-CM | POA: Diagnosis present

## 2022-12-22 MED ORDER — EPOETIN ALFA 10000 UNIT/ML IJ SOLN
5000.0000 [IU] | Freq: Once | INTRAMUSCULAR | Status: AC
  Administered 2022-12-22: 5000 [IU] via SUBCUTANEOUS

## 2022-12-22 NOTE — Addendum Note (Signed)
Encounter addended by: Wyvonne Lenz, RN on: 12/22/2022 8:46 AM  Actions taken: Therapy plan modified

## 2022-12-22 NOTE — Progress Notes (Signed)
Diagnosis: Anemia in Chronic Kidney Disease  Provider:  Celso Amy MD  Procedure: Injection  Procrit (epoetin alfa), Dose: 5000 units , Site: subcutaneous, Number of injections: 1  Hgb 8.6 from CBC collected on 12/20/22. Administered in left arm.  Post Care: Patient declined observation  Discharge: Condition: Good, Destination: Home . AVS Declined  Performed by:  Wyvonne Lenz, RN

## 2022-12-23 ENCOUNTER — Encounter (HOSPITAL_COMMUNITY)
Admission: RE | Admit: 2022-12-23 | Discharge: 2022-12-23 | Disposition: A | Discharge status: Home / Self Care | Payer: Medicaid Other | Source: Ambulatory Visit | Attending: Nephrology | Admitting: Nephrology

## 2022-12-23 ENCOUNTER — Telehealth: Payer: Self-pay

## 2022-12-23 LAB — VITAMIN B1: Vitamin B1 (Thiamine): 104.6 nmol/L (ref 66.5–200.0)

## 2022-12-23 NOTE — Telephone Encounter (Addendum)
Returned Engineer, technical sales from patient's sister, [name] POA [phone]). Per sister, patient received iron infusion last week while admitted; date/time confirmed via chart review. Cancelled today's appointment per request. Confirmed upcoming appointments for Procrit 01/05/23 - 03/02/23.  Called Dr. Lucio Edward office ([facility], [phone]) and spoke with Liborio Nixon. Relayed that patient had received IV iron while hospitalized. Liborio Nixon verbalized understanding and stated that she would speak with Dr. Wolfgang Phoenix to determine whether he wanted to continue with the current treatment plan or make any changes.  Patient's sister is aware that we reached out to Dr. Wolfgang Phoenix and will call her back once we confirm any changes to the patient's treatment plan.  Wyvonne Lenz, RN   Addendum:   Spoke with [name] today (12/27/22) at 1324. Relayed receipt of verbal order per Dr. Wolfgang Phoenix for two additional infusions of Venofer 300 mg, one week apart. Scheduled patient for 12/30/22 at 10 am for next infusion. Misty Stanley to call back if unable to get transportation.  Wyvonne Lenz, RN

## 2022-12-25 LAB — METHYLMALONIC ACID(MMA), RND URINE
Creatinine(Crt), U: 0.25 g/L — ABNORMAL LOW (ref 0.30–3.00)
MMA - Normalized: 1.3 umol/mmol cr (ref 0.3–2.8)
Methylmalonic Acid, Ur: 2.9 umol/L (ref 1.6–29.7)

## 2022-12-27 ENCOUNTER — Other Ambulatory Visit: Payer: Self-pay

## 2022-12-30 ENCOUNTER — Encounter (HOSPITAL_COMMUNITY)
Admission: RE | Admit: 2022-12-30 | Discharge: 2022-12-30 | Disposition: A | Discharge status: Home / Self Care | Payer: Medicaid Other | Source: Ambulatory Visit | Attending: Nephrology | Admitting: Nephrology

## 2022-12-30 VITALS — BP 157/93 | HR 72 | Temp 98.0°F | Resp 16

## 2022-12-30 DIAGNOSIS — N1832 Chronic kidney disease, stage 3b: Secondary | ICD-10-CM | POA: Diagnosis not present

## 2022-12-30 DIAGNOSIS — D638 Anemia in other chronic diseases classified elsewhere: Secondary | ICD-10-CM

## 2022-12-30 MED ORDER — ACETAMINOPHEN 325 MG PO TABS
650.0000 mg | ORAL_TABLET | Freq: Once | ORAL | Status: AC
  Administered 2022-12-30: 650 mg via ORAL
  Filled 2022-12-30: qty 2

## 2022-12-30 MED ORDER — SODIUM CHLORIDE 0.9 % IV SOLN
300.0000 mg | Freq: Once | INTRAVENOUS | Status: AC
  Administered 2022-12-30: 300 mg via INTRAVENOUS
  Filled 2022-12-30: qty 300

## 2022-12-30 MED ORDER — DIPHENHYDRAMINE HCL 25 MG PO CAPS
25.0000 mg | ORAL_CAPSULE | Freq: Once | ORAL | Status: AC
  Administered 2022-12-30: 25 mg via ORAL
  Filled 2022-12-30: qty 1

## 2022-12-30 NOTE — Progress Notes (Signed)
Diagnosis: Iron Deficiency Anemia  Provider:  Celso Amy MD  Procedure: IV Infusion  IV Type: Peripheral, IV Location: L Forearm  Venofer (Iron Sucrose), Dose: 300 mg  Infusion Start Time: 1030  Infusion Stop Time: 1210  Post Infusion IV Care: Observation period completed and Peripheral IV Discontinued  Discharge: Condition: Stable, Destination: Home . AVS Declined  Performed by:  Wyvonne Lenz, RN

## 2022-12-30 NOTE — Addendum Note (Signed)
Encounter addended by: Wyvonne Lenz, RN on: 12/30/2022 12:51 PM  Actions taken: Therapy plan modified

## 2023-01-03 ENCOUNTER — Telehealth: Payer: Self-pay

## 2023-01-03 ENCOUNTER — Telehealth: Payer: Self-pay | Admitting: Diagnostic Neuroimaging

## 2023-01-03 MED ORDER — DIVALPROEX SODIUM 250 MG PO DR TAB: 250.0000 mg | DELAYED_RELEASE_TABLET | Freq: Two times a day (BID) | ORAL | 1 refills | Status: DC

## 2023-01-03 NOTE — Telephone Encounter (Signed)
Pt sister called, Stated pt PCP told her that Dr. Marjory Lies prescribed  divalproex (DEPAKOTE) 250 MG DR tablet. She is requesting refill be sent to [facility]

## 2023-01-03 NOTE — Telephone Encounter (Signed)
Patient needs a Depakote refill. Request is for Depakote 500 MG to split in half for dosing.   Caller was told to call Dr.Penumanali with [facility] for the refill.   Please advise if needed.

## 2023-01-03 NOTE — Telephone Encounter (Signed)
Rx was sent to patient pharmacy. 

## 2023-01-04 ENCOUNTER — Encounter: Payer: Self-pay | Admitting: Gastroenterology

## 2023-01-05 ENCOUNTER — Encounter (HOSPITAL_COMMUNITY)
Admission: RE | Admit: 2023-01-05 | Discharge: 2023-01-05 | Disposition: A | Discharge status: Home / Self Care | Payer: Medicaid Other | Source: Ambulatory Visit | Attending: Nephrology | Admitting: Nephrology

## 2023-01-05 VITALS — BP 117/70 | HR 69 | Temp 97.6°F | Resp 14

## 2023-01-05 DIAGNOSIS — D638 Anemia in other chronic diseases classified elsewhere: Secondary | ICD-10-CM | POA: Diagnosis present

## 2023-01-05 DIAGNOSIS — N1832 Chronic kidney disease, stage 3b: Secondary | ICD-10-CM | POA: Insufficient documentation

## 2023-01-05 DIAGNOSIS — D631 Anemia in chronic kidney disease: Secondary | ICD-10-CM | POA: Diagnosis present

## 2023-01-05 LAB — POCT HEMOGLOBIN-HEMACUE: Hemoglobin: 9.9 g/dL — ABNORMAL LOW (ref 13.0–17.0)

## 2023-01-05 MED ORDER — EPOETIN ALFA 10000 UNIT/ML IJ SOLN
5000.0000 [IU] | Freq: Once | INTRAMUSCULAR | Status: AC
  Administered 2023-01-05: 5000 [IU] via SUBCUTANEOUS

## 2023-01-05 NOTE — Progress Notes (Signed)
Diagnosis: Anemia in Chronic Kidney Disease  Provider:  Manpreet Bhutani MD  Procedure: Injection  Procrit (epoetin alfa), Dose: 5000 units , Site: subcutaneous, Number of injections: 1  Hgb 9.9  Post Care: Patient declined observation  Discharge: Condition: Good, Destination: Home . AVS Declined  Performed by:  Wyvonne Lenz, RN

## 2023-01-06 ENCOUNTER — Encounter (HOSPITAL_COMMUNITY)
Admission: RE | Admit: 2023-01-06 | Discharge: 2023-01-06 | Disposition: A | Discharge status: Home / Self Care | Payer: Medicaid Other | Source: Ambulatory Visit | Attending: Nephrology | Admitting: Nephrology

## 2023-01-06 VITALS — BP 167/114 | HR 89 | Temp 97.4°F | Resp 16

## 2023-01-06 DIAGNOSIS — N1832 Chronic kidney disease, stage 3b: Secondary | ICD-10-CM | POA: Diagnosis not present

## 2023-01-06 DIAGNOSIS — D638 Anemia in other chronic diseases classified elsewhere: Secondary | ICD-10-CM

## 2023-01-06 MED ORDER — SODIUM CHLORIDE 0.9 % IV SOLN
300.0000 mg | Freq: Once | INTRAVENOUS | Status: AC
  Administered 2023-01-06: 300 mg via INTRAVENOUS
  Filled 2023-01-06: qty 200

## 2023-01-06 MED ORDER — DIPHENHYDRAMINE HCL 25 MG PO CAPS
25.0000 mg | ORAL_CAPSULE | Freq: Once | ORAL | Status: AC
  Administered 2023-01-06: 25 mg via ORAL
  Filled 2023-01-06: qty 1

## 2023-01-06 MED ORDER — ACETAMINOPHEN 325 MG PO TABS
650.0000 mg | ORAL_TABLET | Freq: Once | ORAL | Status: AC
  Administered 2023-01-06: 650 mg via ORAL
  Filled 2023-01-06: qty 2

## 2023-01-06 NOTE — Addendum Note (Signed)
Encounter addended by: Forrest Moron, RN on: 01/06/2023 2:30 PM  Actions taken: LDA properties accepted

## 2023-01-06 NOTE — Progress Notes (Signed)
Diagnosis: Anemia in Chronic Kidney Disease  Provider:  Chilton Greathouse MD  Procedure: IV Infusion  IV Type: Peripheral, IV Location: L Antecubital  Venofer (Iron Sucrose), Dose: 300 mg  Infusion Start Time: 1038 am  Infusion Stop Time: 1229 pm  Post Infusion IV Care: Observation period completed and Peripheral IV Discontinued  Discharge: Condition: Good, Destination: Home . AVS Declined  Performed by:  Forrest Moron, RN

## 2023-01-19 ENCOUNTER — Encounter (HOSPITAL_COMMUNITY)
Admission: RE | Admit: 2023-01-19 | Discharge: 2023-01-19 | Disposition: A | Discharge status: Home / Self Care | Payer: Medicaid Other | Source: Ambulatory Visit | Attending: Nephrology | Admitting: Nephrology

## 2023-01-19 VITALS — BP 125/84 | HR 68 | Temp 97.7°F | Resp 18

## 2023-01-19 DIAGNOSIS — D631 Anemia in chronic kidney disease: Secondary | ICD-10-CM

## 2023-01-19 DIAGNOSIS — N1832 Chronic kidney disease, stage 3b: Secondary | ICD-10-CM | POA: Diagnosis not present

## 2023-01-19 LAB — POCT HEMOGLOBIN-HEMACUE: Hemoglobin: 10.4 g/dL — ABNORMAL LOW (ref 13.0–17.0)

## 2023-01-19 MED ORDER — EPOETIN ALFA 10000 UNIT/ML IJ SOLN: 5000.0000 [IU] | Freq: Once | INTRAMUSCULAR | Status: DC

## 2023-01-19 NOTE — Progress Notes (Signed)
Hgb 10.4 - no injection needed 

## 2023-01-26 ENCOUNTER — Ambulatory Visit (INDEPENDENT_AMBULATORY_CARE_PROVIDER_SITE_OTHER): Payer: Medicaid Other | Admitting: Internal Medicine

## 2023-01-26 ENCOUNTER — Encounter: Payer: Self-pay | Admitting: Internal Medicine

## 2023-01-26 ENCOUNTER — Ambulatory Visit (HOSPITAL_COMMUNITY)
Admission: RE | Admit: 2023-01-26 | Discharge: 2023-01-26 | Disposition: A | Discharge status: Home / Self Care | Payer: Medicaid Other | Source: Ambulatory Visit | Attending: Internal Medicine | Admitting: Internal Medicine

## 2023-01-26 VITALS — BP 139/80 | HR 79 | Ht 65.0 in | Wt 116.2 lb

## 2023-01-26 DIAGNOSIS — M25561 Pain in right knee: Secondary | ICD-10-CM

## 2023-01-26 DIAGNOSIS — I63512 Cerebral infarction due to unspecified occlusion or stenosis of left middle cerebral artery: Secondary | ICD-10-CM

## 2023-01-26 DIAGNOSIS — M1A9XX0 Chronic gout, unspecified, without tophus (tophi): Secondary | ICD-10-CM

## 2023-01-26 DIAGNOSIS — M25511 Pain in right shoulder: Secondary | ICD-10-CM

## 2023-01-26 DIAGNOSIS — F32A Depression, unspecified: Secondary | ICD-10-CM

## 2023-01-26 DIAGNOSIS — I6930 Unspecified sequelae of cerebral infarction: Secondary | ICD-10-CM

## 2023-01-26 DIAGNOSIS — J3489 Other specified disorders of nose and nasal sinuses: Secondary | ICD-10-CM | POA: Insufficient documentation

## 2023-01-26 DIAGNOSIS — I25708 Atherosclerosis of coronary artery bypass graft(s), unspecified, with other forms of angina pectoris: Secondary | ICD-10-CM

## 2023-01-26 DIAGNOSIS — F419 Anxiety disorder, unspecified: Secondary | ICD-10-CM

## 2023-01-26 DIAGNOSIS — D638 Anemia in other chronic diseases classified elsewhere: Secondary | ICD-10-CM

## 2023-01-26 DIAGNOSIS — N1832 Chronic kidney disease, stage 3b: Secondary | ICD-10-CM

## 2023-01-26 DIAGNOSIS — E1159 Type 2 diabetes mellitus with other circulatory complications: Secondary | ICD-10-CM

## 2023-01-26 DIAGNOSIS — G40909 Epilepsy, unspecified, not intractable, without status epilepticus: Secondary | ICD-10-CM

## 2023-01-26 DIAGNOSIS — I10 Essential (primary) hypertension: Secondary | ICD-10-CM

## 2023-01-26 NOTE — Patient Instructions (Signed)
It was a pleasure to see you today.  Thank you for giving [place] the opportunity to be involved in your care.  Below is a brief recap of your visit and next steps.  We will plan to see you again in 4 weeks.  Summary You have established care today No medication changes have been made We will follow up in 4 weeks for medication management

## 2023-01-26 NOTE — Progress Notes (Signed)
New Patient Office Visit  Subjective    Patient ID: Melvin Taylor, male    DOB: October 04, 1965  Age: 57 y.o. MRN: [id number]  CC:  Chief Complaint  Patient presents with   Establish Care   HPI Melvin GOGERTY presents to establish care.  He is a 57 year old male with an extensive past medical history notable for CVA in May 2022, CAD s/p CABG x 5 (2017), HTN, T2DM, CKD, history of seizures, depression, and polysubstance abuse.  He was previously followed at the [facility] clinic.  [name] is accompanied by his sister today.  His acute concern is pain in his right knee and around his nose.  He reports a fall last Sunday (6/23).  He is requesting x-rays today.  Outpatient Encounter Medications as of 01/26/2023  Medication Sig   ACCU-CHEK GUIDE test strip USE AS DIRECTED TOoMONITOR BLOOD SUGAR (4)mTIMES DAILY.e   acetaminophen (TYLENOL) 160 MG/5ML solution Place 20.3 mLs (650 mg total) into feeding tube every 4 (four) hours as needed for mild pain (or temp > 37.5 C (99.5 F)).   allopurinol (ZYLOPRIM) 100 MG tablet Take 100 mg by mouth daily.   amLODipine (NORVASC) 10 MG tablet Take 10 mg by mouth daily.   atorvastatin (LIPITOR) 80 MG tablet Take 1 tablet (80 mg total) by mouth at bedtime.   divalproex (DEPAKOTE) 250 MG DR tablet Take 1 tablet (250 mg total) by mouth 2 (two) times daily.   escitalopram (LEXAPRO) 20 MG tablet Take 1 tablet (20 mg total) by mouth daily.   feeding supplement (ENSURE ENLIVE / ENSURE PLUS) LIQD Take 237 mLs by mouth 2 (two) times daily between meals.   ferrous sulfate 325 (65 FE) MG tablet Take 325 mg by mouth daily.   isosorbide mononitrate (IMDUR) 30 MG 24 hr tablet Take 30 mg by mouth daily.   lactulose (CHRONULAC) 10 GM/15ML solution Take 30 mLs (20 g total) by mouth 2 (two) times daily. (Patient taking differently: Take 20 g by mouth 2 (two) times daily. 15 mL every other day.)   lamoTRIgine (LAMICTAL) 25 MG tablet TAKE 2 TABLETS BY MOUTH TWICE DAILY.  (Patient taking differently: Take 50 mg by mouth 2 (two) times daily. 15 mg every other day)   Multiple Vitamin (MULTIVITAMIN WITH MINERALS) TABS tablet Take 1 tablet by mouth daily.   ticagrelor (BRILINTA) 90 MG TABS tablet Take 1 tablet (90 mg total) by mouth 2 (two) times daily.   Vitamin D, Ergocalciferol, (DRISDOL) 1.25 MG (50000 UNIT) CAPS capsule Take 1 capsule (50,000 Units total) by mouth every 7 (seven) days.   [DISCONTINUED] albuterol (VENTOLIN HFA) 108 (90 Base) MCG/ACT inhaler Inhale 2 puffs into the lungs every 4 (four) hours as needed for wheezing or shortness of breath.   [DISCONTINUED] cycloSPORINE (RESTASIS) 0.05 % ophthalmic emulsion Place 1 drop into both eyes 2 (two) times daily as needed.   [DISCONTINUED] pantoprazole (PROTONIX) 40 MG tablet Take 40 mg by mouth daily. (Patient not taking: Reported on 12/18/2022)   No facility-administered encounter medications on file as of 01/26/2023.    Past Medical History:  Diagnosis Date   Bipolar 1 disorder (HCC)    depression/anxiety   Bone spur    left heel   Cervical radiculopathy    Chronic back pain    Chronic chest wall pain    Chronic neck pain    Coronary artery disease    a. s/p CABG in 03/2016 with LIMA-LAD, SVG-D1, SVG-RCA, and Seq SVG-mid and distal OM  Diabetes mellitus    Type II   Hallucinations    "long history of them"   Headache(784.0)    History of gout    HTN (hypertension)    Insomnia    Neuropathy    Pain management    Polysubstance abuse (HCC)    Right leg pain    chronic   Schizophrenia (HCC)    Seizures (HCC)    last sz between July 5-9th, 2016; epilepsy   Sleep apnea    Stroke [facility])    Transfusion of blood product refused for religious reason     Past Surgical History:  Procedure Laterality Date   ANTERIOR CERVICAL DECOMP/DISCECTOMY FUSION N/A 12/14/2015   Procedure: ANTERIOR CERVICAL DECOMPRESSION/DISCECTOMY FUSION CERVICAL FIVE -SIX;  Surgeon: Julio Sicks, MD;  Location: [facility] NEURO ORS;   Service: Neurosurgery;  Laterality: N/A;   BIOPSY  05/07/2015   Procedure: BIOPSY (Gastric);  Surgeon: Corbin Ade, MD;  Location: [facility] ORS;  Service: Endoscopy;;   CARDIAC CATHETERIZATION N/A 03/07/2016   Procedure: Left Heart Cath and Coronary Angiography;  Surgeon: Lyn Records, MD;  Location: [facility] INVASIVE CV LAB;  Service: Cardiovascular;  Laterality: N/A;   COLONOSCOPY WITH PROPOFOL N/A 05/07/2015   [id number] diverticulosis, multiple colon polyps removed, tubular adenoma, serrated colon polyp. Next colonoscopy October 2019   COLONOSCOPY WITH PROPOFOL N/A 08/02/2018   Procedure: COLONOSCOPY WITH PROPOFOL;  Surgeon: Corbin Ade, MD;  Location: [facility] ENDO SUITE;  Service: Endoscopy;  Laterality: N/A;  12:00pm   CORONARY ARTERY BYPASS GRAFT N/A 03/28/2016   Procedure: CORONARY ARTERY BYPASS GRAFTING (CABG) x 5 USING GREATER SAPHENOUS VEIN;  Surgeon: Alleen Borne, MD;  Location: [facility] OR;  Service: Open Heart Surgery;  Laterality: N/A;   ENDOVEIN HARVEST OF GREATER SAPHENOUS VEIN Right 03/28/2016   Procedure: ENDOVEIN HARVEST OF GREATER SAPHENOUS VEIN;  Surgeon: Alleen Borne, MD;  Location: [facility] OR;  Service: Open Heart Surgery;  Laterality: Right;   ESOPHAGEAL DILATION N/A 05/07/2015   Procedure: ESOPHAGEAL DILATION WITH 56FR MALONEY DILATOR;  Surgeon: Corbin Ade, MD;  Location: [facility] ORS;  Service: Endoscopy;  Laterality: N/A;   ESOPHAGOGASTRODUODENOSCOPY (EGD) WITH PROPOFOL N/A 05/07/2015   RMR: Status post dilation of normal esophagus. Gastritis.   IR CT HEAD LTD  12/17/2020   IR CT HEAD LTD  12/17/2020   IR INTRA CRAN STENT  12/17/2020   IR PERCUTANEOUS ART THROMBECTOMY/INFUSION INTRACRANIAL INC DIAG ANGIO  12/17/2020   IR RADIOLOGIST EVAL & MGMT  03/25/2021   KNEE SURGERY Left    arthroscopy   MANDIBLE FRACTURE SURGERY     POLYPECTOMY  05/07/2015   Procedure: POLYPECTOMY (Hepatic Flexure, Distal Transverse Colon, Rectal);  Surgeon: Corbin Ade, MD;  Location: [facility] ORS;  Service: Endoscopy;;    RADIOLOGY WITH ANESTHESIA N/A 12/16/2020   Procedure: IR WITH ANESTHESIA;  Surgeon: Julieanne Cotton, MD;  Location: [facility] OR;  Service: Radiology;  Laterality: N/A;   TEE WITHOUT CARDIOVERSION N/A 03/28/2016   Procedure: TRANSESOPHAGEAL ECHOCARDIOGRAM (TEE);  Surgeon: Alleen Borne, MD;  Location: [facility] OR;  Service: Open Heart Surgery;  Laterality: N/A;   TONSILLECTOMY     TRIGGER FINGER RELEASE Left 10/15/2021   Procedure: RELEASE TRIGGER FINGER/A-1 PULLEY left middle or long finger;  Surgeon: Vickki Hearing, MD;  Location: [facility] ORS;  Service: Orthopedics;  Laterality: Left;    Family History  Problem Relation Age of Onset   Diabetes Father    Hypertension Father    Sleep apnea Father  Stroke Sister    Arthritis Other    Diabetes Other    Asthma Other     Social History   Socioeconomic History   Marital status: Single    Spouse name: Not on file   Number of children: Not on file   Years of education: 11th grade   Highest education level: Not on file  Occupational History   Occupation: unemployed    Employer: NOT EMPLOYED  Tobacco Use   Smoking status: Former    Packs/day: 0.50    Years: 30.00    Additional pack years: 0.00    Total pack years: 15.00    Types: Cigarettes   Smokeless tobacco: Never   Tobacco comments:    3 cigs per day  Vaping Use   Vaping Use: Never used  Substance and Sexual Activity   Alcohol use: Not Currently   Drug use: Not Currently    Types: Marijuana   Sexual activity: Not Currently  Other Topics Concern   Not on file  Social History Narrative   09/13/21 Lives alone, caregiver daily, manages meds   Drinks a cup of coffee a day   Social Determinants of Health   Financial Resource Strain: Not on file  Food Insecurity: No Food Insecurity (12/16/2022)   Hunger Vital Sign    Worried About Running Out of Food in the Last Year: Never true    Ran Out of Food in the Last Year: Never true  Transportation Needs: No Transportation Needs  (12/16/2022)   PRAPARE - Administrator, Civil Service (Medical): No    Lack of Transportation (Non-Medical): No  Physical Activity: Not on file  Stress: Not on file  Social Connections: Not on file  Intimate Partner Violence: Not At Risk (12/16/2022)   Humiliation, Afraid, Rape, and Kick questionnaire    Fear of Current or Ex-Partner: No    Emotionally Abused: No    Physically Abused: No    Sexually Abused: No   Review of Systems  HENT:         Nasal pain  Musculoskeletal:  Positive for joint pain.  All other systems reviewed and are negative.  Objective    BP 139/80   Pulse 79   Ht 5\' 5"  (1.651 m)   Wt 116 lb 3.2 oz (52.7 kg)   SpO2 98%   BMI 19.34 kg/m   Physical Exam Vitals reviewed.  Constitutional:      General: He is not in acute distress.    Appearance: He is not ill-appearing.     Comments: Examined in wheelchair, appears older than stated age  HENT:     Head: Normocephalic and atraumatic.     Right Ear: External ear normal.     Left Ear: External ear normal.     Nose: No congestion or rhinorrhea.     Comments: Ecchymoses present diffusely over the nose    Mouth/Throat:     Mouth: Mucous membranes are moist.     Pharynx: Oropharynx is clear.  Eyes:     General: No scleral icterus.    Extraocular Movements: Extraocular movements intact.     Conjunctiva/sclera: Conjunctivae normal.     Pupils: Pupils are equal, round, and reactive to light.  Cardiovascular:     Rate and Rhythm: Normal rate and regular rhythm.     Pulses: Normal pulses.     Heart sounds: Normal heart sounds. No murmur heard. Pulmonary:     Effort: Pulmonary effort is normal.  Breath sounds: Normal breath sounds. No wheezing, rhonchi or rales.  Abdominal:     General: Abdomen is flat. Bowel sounds are normal. There is no distension.     Palpations: Abdomen is soft.     Tenderness: There is no abdominal tenderness.  Musculoskeletal:        General: Tenderness (TTP to  palpation of the right knee) present. No swelling or deformity.     Cervical back: Normal range of motion.     Comments: There is no obvious deformity on inspection of the right knee.  No significant bruising or swelling.  There is TTP over the patella and lateral aspect of the right knee.  ROM of the right shoulder significantly reduced.  Skin:    General: Skin is warm and dry.     Capillary Refill: Capillary refill takes less than 2 seconds.  Neurological:     General: No focal deficit present.     Mental Status: He is alert and oriented to person, place, and time.     Motor: No weakness.  Psychiatric:        Mood and Affect: Mood normal.        Behavior: Behavior normal.        Thought Content: Thought content normal.   Last CBC Lab Results  Component Value Date   WBC 4.9 12/20/2022   HGB 9.5 (L) 02/01/2023   HCT 26.1 (L) 12/20/2022   MCV 91.6 12/20/2022   MCH 30.2 12/20/2022   RDW 17.0 (H) 12/20/2022   PLT 239 12/20/2022   Last metabolic panel Lab Results  Component Value Date   GLUCOSE 107 (H) 12/21/2022   NA 135 12/21/2022   K 3.9 12/21/2022   CL 103 12/21/2022   CO2 25 12/21/2022   BUN 7 12/21/2022   CREATININE 1.13 12/21/2022   GFRNONAA >60 12/21/2022   CALCIUM 8.4 (L) 12/21/2022   PHOS 4.3 12/20/2022   PROT 6.1 (L) 12/16/2022   ALBUMIN 2.9 (L) 12/20/2022   LABGLOB 2.2 08/05/2022   AGRATIO 1.7 08/05/2022   BILITOT 0.6 12/16/2022   ALKPHOS 45 12/16/2022   AST 24 12/16/2022   ALT 13 12/16/2022   ANIONGAP 7 12/21/2022   Last lipids Lab Results  Component Value Date   CHOL 102 08/28/2022   HDL 42 08/28/2022   LDLCALC 46 08/28/2022   TRIG 71 08/28/2022   CHOLHDL 2.4 08/28/2022   Last hemoglobin A1c Lab Results  Component Value Date   HGBA1C 6.0 (H) 12/17/2022   Last thyroid functions Lab Results  Component Value Date   TSH 2.367 12/20/2022   T4TOTAL 8.9 01/26/2022   Last vitamin D Lab Results  Component Value Date   VD25OH 90.2 08/05/2022    Last vitamin B12 and Folate Lab Results  Component Value Date   VITAMINB12 507 12/20/2022   FOLATE 8.7 12/20/2022    Assessment & Plan:   Problem List Items Addressed This Visit       Primary hypertension    He is currently prescribed amlodipine 10 mg daily.  BP today is 139/80.  No medication changes had made today.  Consider addition of ACE/ARB therapy at follow-up.      CAD (coronary artery disease)    History of CAD s/p CABG x 5 in 2017.  Followed by cardiology.  Currently prescribed Brilinta and atorvastatin 80 mg daily.      Acute ischemic left MCA stroke [facility])    History of left MCA stroke in May 2022.  Followed  by neurology.  No residual deficits.  Currently prescribed Brilinta and atorvastatin 80 mg daily.      Diabetes mellitus (HCC)    Diet controlled.  Followed by endocrinology (Dr. Fransico Him).  Last A1c 6.0.      Seizure disorder (HCC) (Chronic)    Previously documented history of seizures.  Currently prescribed Depakote and Lamictal.  Followed by neurology.      Stage 3b chronic kidney disease (CKD) (HCC)    CKD 3B.  Followed by nephrology (Dr. Wolfgang Phoenix).  Reports that he was last seen for follow-up in late March.  He is also being treated for iron deficiency anemia.  Followed by nephrology and gastroenterology.  Receiving iron infusions and Aranesp.      Knee pain - Primary    His acute concern today is right knee pain in the setting of a recent fall.  There is no obvious deformity on inspection of the right knee, however there is tenderness palpation over the patella and lateral aspect of the right knee. -Right knee x-rays ordered today -Orthopedic surgery referral placed at patient's request      Anxiety and depression    Mood is currently stable on Lexapro.      Anemia, chronic disease    History of iron deficiency anemia in the setting of an extensive medical history.  Currently receiving iron infusions and Aranesp. -GI follow-up scheduled for 7/8       Gout    Denies recent flares.  Symptoms are adequately controlled with allopurinol.      Right shoulder pain    He endorses significant pain with movement of the right shoulder.  ROM is reduced in all directions, concerning for adhesive capsulitis. -Orthopedic surgery referral placed today      Nasal pain    Diffuse ecchymoses is present across the nose.  He recently fell, landing on his face. -Nasal x-rays ordered today      Return in about 4 weeks (around 02/23/2023).   [name], MD

## 2023-01-31 ENCOUNTER — Telehealth: Payer: Self-pay | Admitting: Internal Medicine

## 2023-01-31 NOTE — Telephone Encounter (Signed)
Called in on patient behalf , wants a call back in regard to xray results

## 2023-02-01 ENCOUNTER — Encounter (HOSPITAL_COMMUNITY)
Admission: RE | Admit: 2023-02-01 | Discharge: 2023-02-01 | Disposition: A | Discharge status: Home / Self Care | Payer: Medicaid Other | Source: Ambulatory Visit | Attending: Nephrology | Admitting: Nephrology

## 2023-02-01 VITALS — BP 127/85 | HR 64 | Temp 97.3°F | Resp 16

## 2023-02-01 DIAGNOSIS — N1832 Chronic kidney disease, stage 3b: Secondary | ICD-10-CM | POA: Diagnosis not present

## 2023-02-01 DIAGNOSIS — D631 Anemia in chronic kidney disease: Secondary | ICD-10-CM | POA: Insufficient documentation

## 2023-02-01 LAB — POCT HEMOGLOBIN-HEMACUE: Hemoglobin: 9.5 g/dL — ABNORMAL LOW (ref 13.0–17.0)

## 2023-02-01 MED ORDER — EPOETIN ALFA 10000 UNIT/ML IJ SOLN
5000.0000 [IU] | Freq: Once | INTRAMUSCULAR | Status: AC
  Administered 2023-02-01: 5000 [IU] via SUBCUTANEOUS
  Filled 2023-02-01: qty 1

## 2023-02-01 NOTE — Progress Notes (Signed)
Diagnosis: Anemia in Chronic Kidney Disease  Provider:  Manpreet Bhutani MD  Procedure: Injection  Procrit (epoetin alfa), Dose: 5000 units , Site: subcutaneous, Number of injections: 1  Hgb 9.5  Post Care: Patient declined observation  Discharge: Condition: Good, Destination: Home . AVS Declined  Performed by:  Arrie Senate, RN

## 2023-02-02 ENCOUNTER — Encounter (HOSPITAL_COMMUNITY): Payer: Self-pay | Admitting: Nephrology

## 2023-02-02 ENCOUNTER — Encounter: Payer: Self-pay | Admitting: Internal Medicine

## 2023-02-02 DIAGNOSIS — J3489 Other specified disorders of nose and nasal sinuses: Secondary | ICD-10-CM

## 2023-02-02 DIAGNOSIS — M25511 Pain in right shoulder: Secondary | ICD-10-CM | POA: Insufficient documentation

## 2023-02-02 DIAGNOSIS — M109 Gout, unspecified: Secondary | ICD-10-CM | POA: Insufficient documentation

## 2023-02-02 HISTORY — DX: Other specified disorders of nose and nasal sinuses: J34.89

## 2023-02-02 NOTE — Assessment & Plan Note (Signed)
He is currently prescribed amlodipine 10 mg daily.  BP today is 139/80.  No medication changes had made today.  Consider addition of ACE/ARB therapy at follow-up.

## 2023-02-02 NOTE — Assessment & Plan Note (Signed)
Diffuse ecchymoses is present across the nose.  He recently fell, landing on his face. -Nasal x-rays ordered today

## 2023-02-02 NOTE — Assessment & Plan Note (Signed)
Denies recent flares.  Symptoms are adequately controlled with allopurinol.

## 2023-02-02 NOTE — Assessment & Plan Note (Signed)
He endorses significant pain with movement of the right shoulder.  ROM is reduced in all directions, concerning for adhesive capsulitis. -Orthopedic surgery referral placed today

## 2023-02-02 NOTE — Assessment & Plan Note (Signed)
History of iron deficiency anemia in the setting of an extensive medical history.  Currently receiving iron infusions and Aranesp. -GI follow-up scheduled for 7/8

## 2023-02-02 NOTE — Assessment & Plan Note (Signed)
Diet controlled.  Followed by endocrinology (Dr. Fransico Him).  Last A1c 6.0.

## 2023-02-02 NOTE — Assessment & Plan Note (Signed)
Mood is currently stable on Lexapro.

## 2023-02-02 NOTE — Assessment & Plan Note (Signed)
Previously documented history of seizures.  Currently prescribed Depakote and Lamictal.  Followed by neurology.

## 2023-02-02 NOTE — Assessment & Plan Note (Signed)
CKD 3B.  Followed by nephrology (Dr. Wolfgang Phoenix).  Reports that he was last seen for follow-up in late March.  He is also being treated for iron deficiency anemia.  Followed by nephrology and gastroenterology.  Receiving iron infusions and Aranesp.

## 2023-02-02 NOTE — Assessment & Plan Note (Signed)
His acute concern today is right knee pain in the setting of a recent fall.  There is no obvious deformity on inspection of the right knee, however there is tenderness palpation over the patella and lateral aspect of the right knee. -Right knee x-rays ordered today -Orthopedic surgery referral placed at patient's request

## 2023-02-02 NOTE — Assessment & Plan Note (Signed)
History of CAD s/p CABG x 5 in 2017.  Followed by cardiology.  Currently prescribed Brilinta and atorvastatin 80 mg daily.

## 2023-02-02 NOTE — Assessment & Plan Note (Signed)
History of left MCA stroke in May 2022.  Followed by neurology.  No residual deficits.  Currently prescribed Brilinta and atorvastatin 80 mg daily.

## 2023-02-06 ENCOUNTER — Ambulatory Visit: Payer: Medicaid Other | Admitting: Diagnostic Neuroimaging

## 2023-02-06 ENCOUNTER — Telehealth: Payer: Self-pay | Admitting: Gastroenterology

## 2023-02-06 ENCOUNTER — Telehealth: Payer: Self-pay | Admitting: *Deleted

## 2023-02-06 ENCOUNTER — Ambulatory Visit: Payer: Medicaid Other | Admitting: Gastroenterology

## 2023-02-06 ENCOUNTER — Encounter: Payer: Self-pay | Admitting: Gastroenterology

## 2023-02-06 VITALS — BP 133/83 | HR 67 | Temp 97.7°F | Ht 67.0 in

## 2023-02-06 DIAGNOSIS — D649 Anemia, unspecified: Secondary | ICD-10-CM

## 2023-02-06 DIAGNOSIS — R634 Abnormal weight loss: Secondary | ICD-10-CM | POA: Diagnosis not present

## 2023-02-06 NOTE — Telephone Encounter (Signed)
Medical clearance sent.

## 2023-02-06 NOTE — Telephone Encounter (Signed)
Please request medical clearance from neurology for possible colonoscopy and upper endoscopy.  Patient with history of stroke, subdural hematoma, recent admission for encephalopathy.  We do not plan to hold Brilinta prior to procedures.

## 2023-02-06 NOTE — Telephone Encounter (Signed)
Pt has an appointment today. 02/06/23   Request for patient to stop medication prior to procedure or is needing cleareance  02/06/23  Melvin Taylor 06/09/1966  What type of surgery is being performed? colonoscopy and upper endoscopy   When is surgery scheduled? TBD  What type of clearance is required (medical or pharmacy to hold medication or both? Medical   Patient with history of stroke, subdural hematoma, recent admission for encephalopathy. Pt is needing to have clearance before being scheduled for procedures.   Name of physician performing surgery?  Dr.Rourk Chalmers [facility] at [employer]: [phone] Fax: [phone]  Anethesia type (none, local, MAC, general)? MAC

## 2023-02-06 NOTE — Patient Instructions (Signed)
Please complete stool test and have someone return to our office.  We will be in touch with further recommendations for work up of anemia ie if colonoscopy or upper endoscopy is advised. Stool result will also aid in the decision so please complete as soon as possible.

## 2023-02-06 NOTE — Progress Notes (Signed)
GI Office Note    Referring Provider: [name], FNP Primary Care Physician:  [name], MD  Primary Gastroenterologist: Roetta Sessions, MD   Chief Complaint   Chief Complaint  Patient presents with   Anemia    History of Present Illness   Melvin Taylor is a 57 y.o. male presenting today at the request of Dr. Wolfgang Phoenix for anemia. Last seen in 2019. Patient has a history of prior CVAs including ischemic stroke status post left MCA stent placement, chronically on Brilinta. H/o seizures.  Earlier this year he had subdural hematoma.  He has had several hospitalizations this year. Admitted in early 08/2022 with Covid. Punctate CVA on imaging with subdural hematoma developing during hospitalization, 4mm in left occiptal region. Most recent hospitalization in 11/2022 for with altered MS with elevated ammonia levels in setting of depakote usage. Started on lactulose. Seen by neurology. Evaluated by ST in 09/2022, recommended dysphagia 2 with nectar thick liquids.   Patient has history of chronic anemia, anemia of chronic disease with IDA, receives Procrit and iron infusions.   Patient was scheduled to see me in 08/2022, he left without being seen as his caregiver had to leave. His Hgb was 6.3 at that time. Refused blood products due to being [employer]. Patient did not reschedule ov.   Today: BM couple per week. Not taking lactulose given for recent for elevated ammonia level in setting of depakote. Does not like it. No melena, brbpr. No abdominal pain. Eating good. No heartburn. States he has been cleared to drink thin liquids. He is not sure if he would agree to a colonoscopy but he would consider. He is non ambulatory since strokes.   Last IV iron 6/7, 5/31 Last procrit 02/01/23.  Labs from 12/14/2022: Iron saturation 14% down from 22 (normal reference range 15-55), serum iron 38 down from 68 in December 2023, TIBC 267, ferritin 154.  Most recent hemoglobin 9.5 on February 01, 2023.  In January his lowest hemoglobin was 6.3, 2 weeks later was 9.4.  Patient does not receive blood products due to being a [employer].  Hemoglobin has ranged anywhere from 7-10 this year consistently in the 8-9 range.  Colonoscopy January 2020: -Diverticulosis -Next colonoscopy in 5 years due to history of adenomatous colon polyps.  EGD 2016: -Normal esophagus status post Maloney dilation -Abnormal gastric mucosa of uncertain significance, chronic active gastritis, no H. pylori -Bulbar erosions   Wt Readings from Last 15 Encounters:  01/26/23 116 lb 3.2 oz (52.7 kg)  12/16/22 120 lb 9.5 oz (54.7 kg)  11/11/22 120 lb 9.6 oz (54.7 kg)  11/01/22 120 lb (54.4 kg)  09/28/22 125 lb (56.7 kg)  09/14/22 115 lb 8.3 oz (52.4 kg)  08/27/22 116 lb 10 oz (52.9 kg)  08/05/22 123 lb 12.8 oz (56.2 kg)  08/03/22 123 lb (55.8 kg)  07/28/22 128 lb (58.1 kg)  05/17/22 121 lb 11.1 oz (55.2 kg)  04/19/22 121 lb 9.6 oz (55.2 kg)  01/26/22 128 lb (58.1 kg)  01/20/22 130 lb 15.3 oz (59.4 kg)  01/06/22 130 lb 15.3 oz (59.4 kg)     Medications   Current Outpatient Medications  Medication Sig Dispense Refill   ACCU-CHEK GUIDE test strip USE AS DIRECTED TOoMONITOR BLOOD SUGAR (4)mTIMES DAILY.e     amLODipine (NORVASC) 10 MG tablet Take 10 mg by mouth daily.     atorvastatin (LIPITOR) 80 MG tablet Take 1 tablet (80 mg total) by mouth at  bedtime. 30 tablet 0   divalproex (DEPAKOTE) 250 MG DR tablet Take 1 tablet (250 mg total) by mouth 2 (two) times daily. 120 tablet 1   escitalopram (LEXAPRO) 20 MG tablet Take 1 tablet (20 mg total) by mouth daily. 30 tablet 0   feeding supplement (ENSURE ENLIVE / ENSURE PLUS) LIQD Take 237 mLs by mouth 2 (two) times daily between meals. 14220 mL 1   ferrous sulfate 325 (65 FE) MG tablet Take 325 mg by mouth daily.     insulin glargine (LANTUS SOLOSTAR) 100 UNIT/ML Solostar Pen Inject into the skin.     insulin lispro (HUMALOG) 100 UNIT/ML injection  Inject into the skin.     isosorbide mononitrate (IMDUR) 30 MG 24 hr tablet Take 30 mg by mouth daily.     lactulose (CHRONULAC) 10 GM/15ML solution Take 30 mLs (20 g total) by mouth 2 (two) times daily. (Patient taking differently: Take 20 g by mouth 2 (two) times daily. 15 mL every other day.) 946 mL 2   lamoTRIgine (LAMICTAL) 25 MG tablet TAKE 2 TABLETS BY MOUTH TWICE DAILY. (Patient taking differently: Take 50 mg by mouth 2 (two) times daily. 15 mg every other day) 360 tablet 1   mirtazapine (REMERON) 15 MG tablet Take 15 mg by mouth at bedtime.     Multiple Vitamin (MULTIVITAMIN WITH MINERALS) TABS tablet Take 1 tablet by mouth daily.     risperiDONE (RISPERDAL) 2 MG tablet Take by mouth.     ticagrelor (BRILINTA) 90 MG TABS tablet Take 1 tablet (90 mg total) by mouth 2 (two) times daily. 60 tablet 5   Vitamin D, Ergocalciferol, (DRISDOL) 1.25 MG (50000 UNIT) CAPS capsule Take 1 capsule (50,000 Units total) by mouth every 7 (seven) days. 5 capsule 0   No current facility-administered medications for this visit.    Allergies   Allergies as of 02/06/2023 - Review Complete 02/06/2023  Allergen Reaction Noted   Aspirin Other (See Comments), Swelling, and Itching 12/28/2010     Past Medical History   Past Medical History:  Diagnosis Date   Bipolar 1 disorder (HCC)    depression/anxiety   Bone spur    left heel   Cervical radiculopathy    Chronic back pain    Chronic chest wall pain    Chronic neck pain    Coronary artery disease    a. s/p CABG in 03/2016 with LIMA-LAD, SVG-D1, SVG-RCA, and Seq SVG-mid and distal OM   Diabetes mellitus    Type II   Hallucinations    "long history of them"   Headache(784.0)    History of gout    HTN (hypertension)    Insomnia    Neuropathy    Pain management    Polysubstance abuse (HCC)    Right leg pain    chronic   Schizophrenia (HCC)    Seizures (HCC)    last sz between July 5-9th, 2016; epilepsy   Sleep apnea    Stroke (HCC)     Transfusion of blood product refused for religious reason     Past Surgical History   Past Surgical History:  Procedure Laterality Date   ANTERIOR CERVICAL DECOMP/DISCECTOMY FUSION N/A 12/14/2015   Procedure: ANTERIOR CERVICAL DECOMPRESSION/DISCECTOMY FUSION CERVICAL FIVE -SIX;  Surgeon: Julio Sicks, MD;  Location: [facility];  Service: Neurosurgery;  Laterality: N/A;   BIOPSY  05/07/2015   Procedure: BIOPSY (Gastric);  Surgeon: Corbin Ade, MD;  Location: [facility];  Service: Endoscopy;;   CARDIAC  CATHETERIZATION N/A 03/07/2016   Procedure: Left Heart Cath and Coronary Angiography;  Surgeon: Lyn Records, MD;  Location: [facility] IN[facility]AS[facility] CV [facility];  Service: Cardiovascular;  Laterality: N/A;   COLONOSCOPY WITH PROPOFOL N/A 05/07/2015   [id number] diverticulosis, multiple colon polyps removed, tubular adenoma, serrated colon polyp. Next colonoscopy October 2019   COLONOSCOPY WITH PROPOFOL N/A 08/02/2018   Procedure: COLONOSCOPY WITH PROPOFOL;  Surgeon: Corbin Ade, MD;  Location: [facility];  Service: Endoscopy;  Laterality: N/A;  12:00pm   CORONARY ARTERY BYPASS GRAFT N/A 03/28/2016   Procedure: CORONARY ARTERY BYPASS GRAFTING (CABG) x 5 USING GREATER SAPHENOUS VEIN;  Surgeon: Alleen Borne, MD;  Location: [facility] OR;  Service: Open Heart Surgery;  Laterality: N/A;   ENDOVEIN HARVEST OF GREATER SAPHENOUS VEIN Right 03/28/2016   Procedure: ENDOVEIN HARVEST OF GREATER SAPHENOUS VEIN;  Surgeon: Alleen Borne, MD;  Location: [facility] OR;  Service: Open Heart Surgery;  Laterality: Right;   ESOPHAGEAL DILATION N/A 05/07/2015   Procedure: ESOPHAGEAL DILATION WITH 56FR MALONEY DILATOR;  Surgeon: Corbin Ade, MD;  Location: [facility];  Service: Endoscopy;  Laterality: N/A;   ESOPHAGOGASTRODUODENOSCOPY (EGD) WITH PROPOFOL N/A 05/07/2015   RMR: Status post dilation of normal esophagus. Gastritis.   IR CT HEAD LTD  12/17/2020   IR CT HEAD LTD  12/17/2020   IR INTRA CRAN STENT  12/17/2020   IR PERCUTANEOUS ART  THROMBECTOMY/INFUSION INTRACRANIAL INC DIAG ANGIO  12/17/2020   IR RADIOLOGIST EVAL & MGMT  03/25/2021   KNEE SURGERY Left    arthroscopy   MANDIBLE FRACTURE SURGERY     POLYPECTOMY  05/07/2015   Procedure: POLYPECTOMY (Hepatic Flexure, Distal Transverse Colon, Rectal);  Surgeon: Corbin Ade, MD;  Location: [facility];  Service: Endoscopy;;   RADIOLOGY WITH ANESTHESIA N/A 12/16/2020   Procedure: IR WITH ANESTHESIA;  Surgeon: Julieanne Cotton, MD;  Location[facility];  Service: Radiology;  Laterality: N/A;   TEE WITHOUT CARDIOVERSION N/A 03/28/2016   Procedure: TRANSESOPHAGEAL ECHOCARDIOGRAM (TEE);  Surgeon: Alleen Borne, MD;  Location: [facility] OR;  Service: Open Heart Surgery;  Laterality: N/A;   TONSILLECTOMY     TRIGGER FINGER RELEASE Left 10/15/2021   Procedure: RELEASE TRIGGER FINGER/A-1 PULLEY left middle or long finger;  Surgeon: Vickki Hearing, MD;  Location: [facility];  Service: Orthopedics;  Laterality: Left;    Past Family History   Family History  Problem Relation Age of Onset   Diabetes Father    Hypertension Father    Sleep apnea Father    Stroke Sister    Arthritis Other    Diabetes Other    Asthma Other     Past Social History   Social History   Socioeconomic History   Marital status: Single    Spouse name: Not on file   Number of children: Not on file   Years of education: 11th grade   Highest education level: Not on file  Occupational History   Occupation: unemployed    Employer: NOT EMPLOYED  Tobacco Use   Smoking status: Former    Packs/day: 0.50    Years: 30.00    Additional pack years: 0.00    Total pack years: 15.00    Types: Cigarettes   Smokeless tobacco: Never   Tobacco comments:    3 cigs per day  Vaping Use   Vaping Use: Never used  Substance and Sexual Activity   Alcohol use: Not Currently   Drug use: Not Currently    Types: Marijuana  Sexual activity: Not Currently  Other Topics Concern   Not on file  Social History Narrative    09/13/21 Lives alone, caregiver daily, manages meds   Drinks a cup of coffee a day   Social Determinants of Health   Financial Resource Strain: Not on file  Food Insecurity: No Food Insecurity (12/16/2022)   Hunger Vital Sign    Worried About Running Out of Food in the Last Year: Never true    Ran Out of Food in the Last Year: Never true  Transportation Needs: No Transportation Needs (12/16/2022)   PRAPARE - Administrator, Civil Service (Medical): No    Lack of Transportation (Non-Medical): No  Physical Activity: Not on file  Stress: Not on file  Social Connections: Not on file  Intimate Partner Violence: Not At Risk (12/16/2022)   Humiliation, Afraid, Rape, and Kick questionnaire    Fear of Current or Ex-Partner: No    Emotionally Abused: No    Physically Abused: No    Sexually Abused: No    Review of Systems   General: Negative for anorexia, weight loss, fever, chills, fatigue, weakness. ENT: Negative for hoarseness, difficulty swallowing , nasal congestion. CV: Negative for chest pain, angina, palpitations, dyspnea on exertion, peripheral edema.  Respiratory: Negative for dyspnea at rest, dyspnea on exertion, cough, sputum, wheezing.  GI: See history of present illness. GU:  Negative for dysuria, hematuria, urinary incontinence, urinary frequency, nocturnal urination.  Endo: Negative for unusual weight change.     Physical Exam   BP 133/83 (BP Location: Left Arm, Patient Position: Sitting, Cuff Size: Normal)   Pulse 67   Temp 97.7 F (36.5 C) (Temporal)   Ht 5\' 7"  (1.702 m)   BMI 18.20 kg/m    General: thin male in wheelchair. no acute distress. Alert and oriented.   Eyes: No icterus. Mouth: Oropharyngeal mucosa moist and pink , no lesions erythema or exudate. Lungs: Clear to auscultation bilaterally.  Heart: Regular rate and rhythm, no murmurs rubs or gallops.  Abdomen: Bowel sounds are normal, nontender, nondistended, no hepatosplenomegaly or masses,   no abdominal bruits or hernia , no rebound or guarding.  Rectal: not performed Extremities: No lower extremity edema. No clubbing or deformities. Neuro: Alert and oriented x 4   Skin: Warm and dry, no jaundice.   Psych: Alert and cooperative, normal mood and affect.  Labs   Lab Results  Component Value Date   CREATININE 1.13 12/21/2022   BUN 7 12/21/2022   NA 135 12/21/2022   K 3.9 12/21/2022   CL 103 12/21/2022   CO2 25 12/21/2022   Lab Results  Component Value Date   WBC 4.9 12/20/2022   HGB 9.5 (L) 02/01/2023   HCT 26.1 (L) 12/20/2022   MCV 91.6 12/20/2022   PLT 239 12/20/2022   Lab Results  Component Value Date   ALT 13 12/16/2022   AST 24 12/16/2022   ALKPHOS 45 12/16/2022   BILITOT 0.6 12/16/2022   Lab Results  Component Value Date   IRON 69 11/10/2022   TIBC 274 11/10/2022   FERRITIN 70 11/10/2022   Lab Results  Component Value Date   VITAMINB12 507 12/20/2022   Lab Results  Component Value Date   FOLATE 8.7 12/20/2022    Imaging Studies   DG Nasal Bones  Result Date: 01/31/2023 CLINICAL DATA:  Fall, nose pain EXAM: NASAL BONES - 3+ VIEW COMPARISON:  None Available. FINDINGS: Minimally displaced fractures of the nasal bones. Overlying soft  tissue edema. Patient is edentulous. Paranasal sinuses are normally aerated. IMPRESSION: Minimally displaced fractures of the nasal bones with overlying soft tissue edema. Electronically Signed   By: Jearld Lesch M.D.   On: 01/31/2023 09:35   DG Knee Complete 4 Views Right  Result Date: 01/31/2023 CLINICAL DATA:  Knee pain after a fall EXAM: RIGHT KNEE - COMPLETE 4+ VIEW COMPARISON:  None Available. FINDINGS: There is no acute fracture or dislocation. Bony alignment is normal. The joint spaces are preserved, without significant degenerative change. Extensive vascular calcifications are noted. There is a probable small suprapatellar effusion. IMPRESSION: Probable small suprapatellar effusion. No acute osseous  abnormality. Electronically Signed   By: Lesia Hausen M.D.   On: 01/31/2023 09:34    Assessment/Plan:   Anemia: Mixed picture, anemia of chronic disease with low iron saturations.  Serum iron and iron saturations both decreasing since last year. Hgb stable now with Procrit and IV iron. No overt GI bleeding.  Hemoccult status unknown.  He is due for colonoscopy in January 2025 for history of adenomatous colon polyps.  Remotely had an EGD as outlined above.  He has had some weight loss as noted although has been stable over the past several months.  He denies any GI complaints although admits to infrequent stools couple times per week.  He is not sure about pursuing colonoscopy at this time.  Bowel prep would be difficult for him given his immobility.  It would be helpful to update colonoscopy to rule out occult GI bleeding as a source of his iron deficiency.  Would consider an upper at the same time.  Initially check ifobt. Obtain clearance from neurology for possible colonoscopy/egd.      Leanna Battles. Melvyn Neth, MHS, PA-C [facility]

## 2023-02-07 ENCOUNTER — Encounter: Payer: Self-pay | Admitting: Orthopedic Surgery

## 2023-02-07 ENCOUNTER — Ambulatory Visit: Payer: Medicaid Other | Admitting: Orthopedic Surgery

## 2023-02-07 ENCOUNTER — Other Ambulatory Visit (INDEPENDENT_AMBULATORY_CARE_PROVIDER_SITE_OTHER): Payer: Medicaid Other

## 2023-02-07 VITALS — BP 126/89 | HR 87 | Ht 67.0 in | Wt 116.0 lb

## 2023-02-07 DIAGNOSIS — M25511 Pain in right shoulder: Secondary | ICD-10-CM | POA: Diagnosis not present

## 2023-02-07 DIAGNOSIS — M25561 Pain in right knee: Secondary | ICD-10-CM

## 2023-02-07 NOTE — Patient Instructions (Signed)

## 2023-02-07 NOTE — Progress Notes (Unsigned)
New Patient Visit  Assessment: Melvin Taylor is a 57 y.o. male with the following: 1. Acute pain of right shoulder 2. Acute pain of right knee   Plan: Melvin Taylor has pain in his right knee, as well as his right shoulder.  On physical exam, there is no bruising.  No swelling around either his shoulder or his knee.  He has minimal motion due to pain.  Tenderness to palpation over the lateral aspect of the knee.  Radiographs are without acute injury.  He does not have significant degenerative changes.  Unclear what is causing his discomfort at this time.  I have offered him steroid injection for both the knee and the right shoulder, and he is elected to proceed.  This was completed in clinic today.  He will follow-up as needed.   Procedure note injection - Right shoulder    Verbal consent was obtained to inject the right shoulder, subacromial space Timeout was completed to confirm the site of injection.   The skin was prepped with alcohol and ethyl chloride was sprayed at the injection site.  A 21-gauge needle was used to inject 40 mg of Depo-Medrol and 1% lidocaine (3 cc) into the subacromial space of the right shoulder using a posterolateral approach.  There were no complications.  A sterile bandage was applied.   Procedure note injection Right knee joint   Verbal consent was obtained to inject the right knee joint  Timeout was completed to confirm the site of injection.  The skin was prepped with alcohol and ethyl chloride was sprayed at the injection site.  A 21-gauge needle was used to inject 40 mg of Depo-Medrol and 1% lidocaine (3 cc) into the right knee using an anterolateral approach.  There were no complications. A sterile bandage was applied.   Follow-up: Return if symptoms worsen or fail to improve.  Subjective:  Chief Complaint  Patient presents with   Shoulder Pain    Had a fall from a seizure recently and having r shoulder pain     History of Present  Illness: Melvin Taylor is a 57 y.o. male who has been referred by Delmar Landau, MD for evaluation of right shoulder pain.  He also is complaining of right knee pain.  Regarding the right shoulder, he has had pain for the last couple months, ever since he sustained a seizure.  No specific injury.  Never had an issue in his right shoulder prior to the seizure.  Regarding his right knee, he states he fell a couple weeks ago.  Since then, he has had pain in the anterior aspect of the knee.  He has limited motion.  Medications have not been helpful.   Review of Systems: No fevers or chills No numbness or tingling No chest pain No shortness of breath No bowel or bladder dysfunction No GI distress No headaches   Medical History:  Past Medical History:  Diagnosis Date   Bipolar 1 disorder (HCC)    depression/anxiety   Bone spur    left heel   Cervical radiculopathy    Chronic back pain    Chronic chest wall pain    Chronic neck pain    Coronary artery disease    a. s/p CABG in 03/2016 with LIMA-LAD, SVG-D1, SVG-RCA, and Seq SVG-mid and distal OM   Diabetes mellitus    Type II   Hallucinations    "long history of them"   Headache(784.0)    History of gout  HTN (hypertension)    Insomnia    Neuropathy    Pain management    Polysubstance abuse (HCC)    Right leg pain    chronic   Schizophrenia (HCC)    Seizures (HCC)    last sz between July 5-9th, 2016; epilepsy   Sleep apnea    Stroke [facility])    Transfusion of blood product refused for religious reason     Past Surgical History:  Procedure Laterality Date   ANTERIOR CERVICAL DECOMP/DISCECTOMY FUSION N/A 12/14/2015   Procedure: ANTERIOR CERVICAL DECOMPRESSION/DISCECTOMY FUSION CERVICAL FIVE -SIX;  Surgeon: Julio Sicks, MD;  Location: [facility] NEURO ORS;  Service: Neurosurgery;  Laterality: N/A;   BIOPSY  05/07/2015   Procedure: BIOPSY (Gastric);  Surgeon: Corbin Ade, MD;  Location: [facility] ORS;  Service: Endoscopy;;   CARDIAC  CATHETERIZATION N/A 03/07/2016   Procedure: Left Heart Cath and Coronary Angiography;  Surgeon: Lyn Records, MD;  Location: [facility] INVASIVE CV LAB;  Service: Cardiovascular;  Laterality: N/A;   COLONOSCOPY WITH PROPOFOL N/A 05/07/2015   [id number] diverticulosis, multiple colon polyps removed, tubular adenoma, serrated colon polyp. Next colonoscopy October 2019   COLONOSCOPY WITH PROPOFOL N/A 08/02/2018   Procedure: COLONOSCOPY WITH PROPOFOL;  Surgeon: Corbin Ade, MD;  Location: [facility] ENDO SUITE;  Service: Endoscopy;  Laterality: N/A;  12:00pm   CORONARY ARTERY BYPASS GRAFT N/A 03/28/2016   Procedure: CORONARY ARTERY BYPASS GRAFTING (CABG) x 5 USING GREATER SAPHENOUS VEIN;  Surgeon: Alleen Borne, MD;  Location: [facility] OR;  Service: Open Heart Surgery;  Laterality: N/A;   ENDOVEIN HARVEST OF GREATER SAPHENOUS VEIN Right 03/28/2016   Procedure: ENDOVEIN HARVEST OF GREATER SAPHENOUS VEIN;  Surgeon: Alleen Borne, MD;  Location: [facility] OR;  Service: Open Heart Surgery;  Laterality: Right;   ESOPHAGEAL DILATION N/A 05/07/2015   Procedure: ESOPHAGEAL DILATION WITH 56FR MALONEY DILATOR;  Surgeon: Corbin Ade, MD;  Location: [facility] ORS;  Service: Endoscopy;  Laterality: N/A;   ESOPHAGOGASTRODUODENOSCOPY (EGD) WITH PROPOFOL N/A 05/07/2015   RMR: Status post dilation of normal esophagus. Gastritis.   IR CT HEAD LTD  12/17/2020   IR CT HEAD LTD  12/17/2020   IR INTRA CRAN STENT  12/17/2020   IR PERCUTANEOUS ART THROMBECTOMY/INFUSION INTRACRANIAL INC DIAG ANGIO  12/17/2020   IR RADIOLOGIST EVAL & MGMT  03/25/2021   KNEE SURGERY Left    arthroscopy   MANDIBLE FRACTURE SURGERY     POLYPECTOMY  05/07/2015   Procedure: POLYPECTOMY (Hepatic Flexure, Distal Transverse Colon, Rectal);  Surgeon: Corbin Ade, MD;  Location: [facility]S;  Service: Endoscopy;;   RADIOLOGY WITH ANESTHESIA N/A 12/16/2020   Procedure: IR WITH ANESTHESIA;  Surgeon: Julieanne Cotton, MD;  Location: [facility] OR;  Service: Radiology;  Laterality: N/A;   TEE  WITHOUT CARDIOVERSION N/A 03/28/2016   Procedure: TRANSESOPHAGEAL ECHOCARDIOGRAM (TEE);  Surgeon: Alleen Borne, MD;  Location: [facility] OR;  Service: Open Heart Surgery;  Laterality: N/A;   TONSILLECTOMY     TRIGGER FINGER RELEASE Left 10/15/2021   Procedure: RELEASE TRIGGER FINGER/A-1 PULLEY left middle or long finger;  Surgeon: Vickki Hearing, MD;  Location: [facility] ORS;  Service: Orthopedics;  Laterality: Left;    Family History  Problem Relation Age of Onset   Diabetes Father    Hypertension Father    Sleep apnea Father    Stroke Sister    Arthritis Other    Diabetes Other    Asthma Other    Social History   Tobacco Use  Smoking status: Former    Packs/day: 0.50    Years: 30.00    Additional pack years: 0.00    Total pack years: 15.00    Types: Cigarettes   Smokeless tobacco: Never   Tobacco comments:    3 cigs per day  Vaping Use   Vaping Use: Never used  Substance Use Topics   Alcohol use: Not Currently   Drug use: Not Currently    Types: Marijuana    Allergies  Allergen Reactions   Aspirin Other (See Comments), Swelling and Itching    Facial swelling  Facial swelling , Reaction:  Facial swelling    Current Meds  Medication Sig   ACCU-CHEK GUIDE test strip USE AS DIRECTED TOoMONITOR BLOOD SUGAR (4)mTIMES DAILY.e   amLODipine (NORVASC) 10 MG tablet Take 10 mg by mouth daily.   atorvastatin (LIPITOR) 80 MG tablet Take 1 tablet (80 mg total) by mouth at bedtime.   divalproex (DEPAKOTE) 250 MG DR tablet Take 1 tablet (250 mg total) by mouth 2 (two) times daily.   escitalopram (LEXAPRO) 20 MG tablet Take 1 tablet (20 mg total) by mouth daily.   feeding supplement (ENSURE ENLIVE / ENSURE PLUS) LIQD Take 237 mLs by mouth 2 (two) times daily between meals.   ferrous sulfate 325 (65 FE) MG tablet Take 325 mg by mouth daily.   insulin glargine (LANTUS SOLOSTAR) 100 UNIT/ML Solostar Pen Inject into the skin.   insulin lispro (HUMALOG) 100 UNIT/ML injection Inject into the  skin.   isosorbide mononitrate (IMDUR) 30 MG 24 hr tablet Take 30 mg by mouth daily.   lactulose (CHRONULAC) 10 GM/15ML solution Take 30 mLs (20 g total) by mouth 2 (two) times daily.   lamoTRIgine (LAMICTAL) 25 MG tablet TAKE 2 TABLETS BY MOUTH TWICE DAILY. (Patient taking differently: Take 50 mg by mouth 2 (two) times daily. 15 mg every other day)   mirtazapine (REMERON) 15 MG tablet Take 15 mg by mouth at bedtime.   Multiple Vitamin (MULTIVITAMIN WITH MINERALS) TABS tablet Take 1 tablet by mouth daily.   risperiDONE (RISPERDAL) 2 MG tablet Take by mouth.   ticagrelor (BRILINTA) 90 MG TABS tablet Take 1 tablet (90 mg total) by mouth 2 (two) times daily.   Vitamin D, Ergocalciferol, (DRISDOL) 1.25 MG (50000 UNIT) CAPS capsule Take 1 capsule (50,000 Units total) by mouth every 7 (seven) days.    Objective: BP 126/89   Pulse 87   Ht 5\' 7"  (1.702 m)   Wt 116 lb (52.6 kg)   BMI 18.17 kg/m   Physical Exam:  General: Alert and oriented. and No acute distress. Gait: Ambulates with the assistance of a walker.  Thin, frail appearing.  Right shoulder without deformity.  No bruising.  No swelling.  Limited range of motion due to pain.  Sensation is intact in the right upper extremity.  Evaluation of the right knee demonstrates no bruising.  No swelling.  No redness.  He has exquisite tenderness to palpation, especially over the anterior and lateral aspect of the right knee.  Limited range of motion.  IMAGING: I personally ordered and reviewed the following images  X-rays of the right shoulder were obtained in clinic today.  No acute injury.  Well-maintained glenohumeral joint space.  There does appear to be some proximal humeral migration.  Minimal osteophytes.  No bony lesions.  Impression: Negative right shoulder x-rays, with some proximal humeral migration   X-rays of the right knee were previously obtained.  Mild loss of joint  space.  Minimal osteophytes.   New Medications:  No orders  of the defined types were placed in this encounter.     Oliver Barre, MD  02/08/2023 8:20 AM

## 2023-02-09 ENCOUNTER — Ambulatory Visit: Payer: Medicaid Other | Admitting: Diagnostic Neuroimaging

## 2023-02-13 ENCOUNTER — Institutional Professional Consult (permissible substitution): Payer: Medicaid Other | Admitting: Diagnostic Neuroimaging

## 2023-02-13 NOTE — Telephone Encounter (Signed)
 Please advise. Thank you

## 2023-02-13 NOTE — Telephone Encounter (Signed)
Patient with hx of stroke s/p MCA stent in 2022 currently on brilinta. As long as no recent strokes or stroke/TIA symptoms over the past 4-6 months, can hold Brilinta for 3-5 days prior with small but acceptable risk of recurrent stroke while off therapy. Recommend restarting immediately after or once cleared by GI. Would recommend further clearance to hold Brilinta with IR Dr. Corliss Skains which was started post MCA stent in 2022.

## 2023-02-13 NOTE — Telephone Encounter (Signed)
Pt is needing to have clearance before his procedures can be scheduled.

## 2023-02-15 ENCOUNTER — Other Ambulatory Visit: Payer: Self-pay

## 2023-02-16 ENCOUNTER — Encounter (HOSPITAL_COMMUNITY)
Admission: RE | Admit: 2023-02-16 | Discharge: 2023-02-16 | Disposition: A | Discharge status: Home / Self Care | Payer: Medicaid Other | Source: Ambulatory Visit | Attending: Nephrology | Admitting: Nephrology

## 2023-02-16 VITALS — BP 164/82 | HR 58 | Temp 98.1°F | Resp 16

## 2023-02-16 DIAGNOSIS — N1832 Chronic kidney disease, stage 3b: Secondary | ICD-10-CM

## 2023-02-16 LAB — POCT HEMOGLOBIN-HEMACUE: Hemoglobin: 9.1 g/dL — ABNORMAL LOW (ref 13.0–17.0)

## 2023-02-16 MED ORDER — EPOETIN ALFA 10000 UNIT/ML IJ SOLN
5000.0000 [IU] | Freq: Once | INTRAMUSCULAR | Status: AC
  Administered 2023-02-16: 5000 [IU] via SUBCUTANEOUS

## 2023-02-16 NOTE — Progress Notes (Signed)
Diagnosis: Anemia in Chronic Kidney Disease  Provider:  Manpreet Bhutani MD  Procedure: Injection  Procrit (epoetin alfa), Dose: 5000 units , Site: subcutaneous, Number of injections: 1  Hgb 9.1  Post Care: Patient declined observation  Discharge: Condition: Good, Destination: Home . AVS given.  Performed by:  Arrie Senate, RN

## 2023-02-20 NOTE — Telephone Encounter (Signed)
Please reach out to patient. We were waiting on ifobt, he was reluctant to proceed with procedures and mutually decided at ov, to check for occult gi bleeding to help him make decision. We have not received ifobt back.   He can proceed with colonoscopy+/- EGD to evaluate anemia if he agrees. Neurology provided approval to hold Brilinta but we do not plan to hold this for procedures. We had requested approval for proceeding with procedures.      Let me know what he decides.

## 2023-02-21 NOTE — Telephone Encounter (Signed)
Spoke with pt's sister [name] (on [place]) and she states pt's aide was supposed to have already done the ifobt. She says she is going to see pt today and will find out what he wants to do and give [place] a call back.

## 2023-02-23 ENCOUNTER — Encounter: Payer: Self-pay | Admitting: Internal Medicine

## 2023-02-23 ENCOUNTER — Ambulatory Visit (INDEPENDENT_AMBULATORY_CARE_PROVIDER_SITE_OTHER): Payer: Medicaid Other | Admitting: Internal Medicine

## 2023-02-23 VITALS — BP 126/71 | HR 78 | Ht 67.0 in | Wt 134.6 lb

## 2023-02-23 DIAGNOSIS — E1159 Type 2 diabetes mellitus with other circulatory complications: Secondary | ICD-10-CM

## 2023-02-23 DIAGNOSIS — D638 Anemia in other chronic diseases classified elsewhere: Secondary | ICD-10-CM | POA: Diagnosis not present

## 2023-02-23 DIAGNOSIS — S022XXA Fracture of nasal bones, initial encounter for closed fracture: Secondary | ICD-10-CM

## 2023-02-23 DIAGNOSIS — I10 Essential (primary) hypertension: Secondary | ICD-10-CM | POA: Diagnosis not present

## 2023-02-23 DIAGNOSIS — S022XXD Fracture of nasal bones, subsequent encounter for fracture with routine healing: Secondary | ICD-10-CM | POA: Diagnosis not present

## 2023-02-23 HISTORY — DX: Fracture of nasal bones, initial encounter for closed fracture: S02.2XXA

## 2023-02-23 NOTE — Assessment & Plan Note (Signed)
A1c 6.0 on labs from May.  Followed by endocrinology (Dr. Fransico Him).  Diet controlled.  I have recommended that Melvin Taylor completed diabetic eye exam.  He reports that his sister is working to schedule an appointment with ophthalmology.  Diabetes related preventative care items are otherwise up-to-date.

## 2023-02-23 NOTE — Assessment & Plan Note (Signed)
Nasal x-rays were obtained at his last appointment in the setting of a recent fall with nasal pain and ecchymoses on exam.  X-rays revealed minimally displaced nasal bone fractures.  No intervention or treatment is needed.  Patient reports today that nasal pain is gradually improving.

## 2023-02-23 NOTE — Progress Notes (Signed)
Established Patient Office Visit  Subjective   Patient ID: Melvin Taylor, male    DOB: [birth year]  Age: 57 y.o. MRN: [id number]  Chief Complaint  Patient presents with   Hypertension    Follow up   Diabetes    Follow up   Melvin Taylor returns to care today for routine follow-up.  He was last evaluated by me on 6/27 as a new patient presenting to establish care.  At that time we reviewed his medical history.  He was additionally concerned about pain in his right knee and his nose after a fall.  X-rays were obtained, which showed nasal fractures with minimal displacement and no acute osseous abnormalities in the right knee.  He was referred to orthopedic surgery at his request and has since established care with Dr. Dallas Schimke.  He has received corticosteroid injections in the right shoulder and knee.  He has also been seen by gastroenterology for follow-up in the setting of anemia and weight loss.  He will undergo colonoscopy and EGD in the near future.  Melvin Taylor reports feeling fairly well today.  He is asymptomatic currently and has no acute concerns to discuss.   Past Medical History:  Diagnosis Date   Bipolar 1 disorder (HCC)    depression/anxiety   Bone spur    left heel   Cervical radiculopathy    Chronic back pain    Chronic chest wall pain    Chronic neck pain    Coronary artery disease    a. s/p CABG in 03/2016 with LIMA-LAD, SVG-D1, SVG-RCA, and Seq SVG-mid and distal OM   Diabetes mellitus    Type II   Hallucinations    "long history of them"   Headache(784.0)    History of gout    HTN (hypertension)    Insomnia    Neuropathy    Pain management    Polysubstance abuse (HCC)    Right leg pain    chronic   Schizophrenia (HCC)    Seizures (HCC)    last sz between July 5-9th, 2016; epilepsy   Sleep apnea    Stroke (HCC)    Transfusion of blood product refused for religious reason    Past Surgical History:  Procedure Laterality Date   ANTERIOR CERVICAL  DECOMP/DISCECTOMY FUSION N/A 12/14/2015   Procedure: ANTERIOR CERVICAL DECOMPRESSION/DISCECTOMY FUSION CERVICAL FIVE -SIX;  Surgeon: Julio Sicks, MD;  Location: [facility] NEURO ORS;  Service: Neurosurgery;  Laterality: N/A;   BIOPSY  05/07/2015   Procedure: BIOPSY (Gastric);  Surgeon: Corbin Ade, MD;  Location: [facility] ORS;  Service: Endoscopy;;   CARDIAC CATHETERIZATION N/A 03/07/2016   Procedure: Left Heart Cath and Coronary Angiography;  Surgeon: Lyn Records, MD;  Location: [facility] INVASIVE CV LAB;  Service: Cardiovascular;  Laterality: N/A;   COLONOSCOPY WITH PROPOFOL N/A 05/07/2015   NGE:[id number] diverticulosis, multiple colon polyps removed, tubular adenoma, serrated colon polyp. Next colonoscopy October 2019   COLONOSCOPY WITH PROPOFOL N/A 08/02/2018   Procedure: COLONOSCOPY WITH PROPOFOL;  Surgeon: Corbin Ade, MD;  Location: [facility];  Service: Endoscopy;  Laterality: N/A;  12:00pm   CORONARY ARTERY BYPASS GRAFT N/A 03/28/2016   Procedure: CORONARY ARTERY BYPASS GRAFTING (CABG) x 5 USING GREATER SAPHENOUS VEIN;  Surgeon: Alleen Borne, MD;  Location: [facility] OR;  Service: Open Heart Surgery;  Laterality: N/A;   ENDOVEIN HARVEST OF GREATER SAPHENOUS VEIN Right 03/28/2016   Procedure: ENDOVEIN HARVEST OF GREATER SAPHENOUS VEIN;  Surgeon: Alleen Borne, MD;  Location: [facility] OR;  Service: Open Heart Surgery;  Laterality: Right;   ESOPHAGEAL DILATION N/A 05/07/2015   Procedure: ESOPHAGEAL DILATION WITH 56FR MALONEY DILATOR;  Surgeon: Corbin Ade, MD;  Location: [facility] ORS;  Service: Endoscopy;  Laterality: N/A;   ESOPHAGOGASTRODUODENOSCOPY (EGD) WITH PROPOFOL N/A 05/07/2015   RMR: Status post dilation of normal esophagus. Gastritis.   IR CT HEAD LTD  12/17/2020   IR CT HEAD LTD  12/17/2020   IR INTRA CRAN STENT  12/17/2020   IR PERCUTANEOUS ART THROMBECTOMY/INFUSION INTRACRANIAL INC DIAG ANGIO  12/17/2020   IR RADIOLOGIST EVAL & MGMT  03/25/2021   KNEE SURGERY Left    arthroscopy   MANDIBLE FRACTURE SURGERY      POLYPECTOMY  05/07/2015   Procedure: POLYPECTOMY (Hepatic Flexure, Distal Transverse Colon, Rectal);  Surgeon: Corbin Ade, MD;  Location: [facility] ORS;  Service: Endoscopy;;   RADIOLOGY WITH ANESTHESIA N/A 12/16/2020   Procedure: IR WITH ANESTHESIA;  Surgeon: Julieanne Cotton, MD;  Location: [facility] OR;  Service: Radiology;  Laterality: N/A;   TEE WITHOUT CARDIOVERSION N/A 03/28/2016   Procedure: TRANSESOPHAGEAL ECHOCARDIOGRAM (TEE);  Surgeon: Alleen Borne, MD;  Location: [facility] OR;  Service: Open Heart Surgery;  Laterality: N/A;   TONSILLECTOMY     TRIGGER FINGER RELEASE Left 10/15/2021   Procedure: RELEASE TRIGGER FINGER/A-1 PULLEY left middle or long finger;  Surgeon: Vickki Hearing, MD;  Location: [facility] ORS;  Service: Orthopedics;  Laterality: Left;   Social History   Tobacco Use   Smoking status: Former    Current packs/day: 0.50    Average packs/day: 0.5 packs/day for 30.0 years (15.0 ttl pk-yrs)    Types: Cigarettes   Smokeless tobacco: Never   Tobacco comments:    3 cigs per day  Vaping Use   Vaping status: Never Used  Substance Use Topics   Alcohol use: Not Currently   Drug use: Not Currently    Types: Marijuana   Family History  Problem Relation Age of Onset   Diabetes Father    Hypertension Father    Sleep apnea Father    Stroke Sister    Arthritis Other    Diabetes Other    Asthma Other    Allergies  Allergen Reactions   Aspirin Other (See Comments), Swelling and Itching    Facial swelling  Facial swelling , Reaction:  Facial swelling   Review of Systems  Constitutional:  Negative for chills and fever.  HENT:  Negative for sore throat.   Respiratory:  Negative for cough and shortness of breath.   Cardiovascular:  Negative for chest pain, palpitations and leg swelling.  Gastrointestinal:  Negative for abdominal pain, blood in stool, constipation, diarrhea, nausea and vomiting.  Genitourinary:  Negative for dysuria and hematuria.  Musculoskeletal:  Negative for  myalgias.  Skin:  Negative for itching and rash.  Neurological:  Negative for dizziness and headaches.  Psychiatric/Behavioral:  Negative for depression and suicidal ideas.      Objective:     BP 126/71   Pulse 78   Ht 5\' 7"  (1.702 m)   Wt 134 lb 9.6 oz (61.1 kg)   SpO2 97%   BMI 21.08 kg/m  BP Readings from Last 3 Encounters:  02/23/23 126/71  02/16/23 (!) 164/82  02/07/23 126/89   Physical Exam Vitals reviewed.  Constitutional:      General: He is not in acute distress.    Appearance: Normal appearance. He is not ill-appearing.     Comments: Examined in wheelchair  HENT:     Head: Normocephalic and atraumatic.     Right Ear: External ear normal.     Left Ear: External ear normal.     Nose: Nose normal. No congestion or rhinorrhea.     Mouth/Throat:     Mouth: Mucous membranes are moist.     Pharynx: Oropharynx is clear.  Eyes:     General: No scleral icterus.    Extraocular Movements: Extraocular movements intact.     Conjunctiva/sclera: Conjunctivae normal.     Pupils: Pupils are equal, round, and reactive to light.  Cardiovascular:     Rate and Rhythm: Normal rate and regular rhythm.     Pulses: Normal pulses.     Heart sounds: Normal heart sounds. No murmur heard. Pulmonary:     Effort: Pulmonary effort is normal.     Breath sounds: Normal breath sounds. No wheezing, rhonchi or rales.  Abdominal:     General: Abdomen is flat. Bowel sounds are normal. There is no distension.     Palpations: Abdomen is soft.     Tenderness: There is no abdominal tenderness.  Musculoskeletal:        General: No swelling or deformity. Normal range of motion.     Cervical back: Normal range of motion.  Skin:    General: Skin is warm and dry.     Capillary Refill: Capillary refill takes less than 2 seconds.  Neurological:     General: No focal deficit present.     Mental Status: He is alert and oriented to person, place, and time.     Motor: No weakness.  Psychiatric:         Mood and Affect: Mood normal.        Behavior: Behavior normal.        Thought Content: Thought content normal.   Last CBC Lab Results  Component Value Date   WBC 4.9 12/20/2022   HGB 9.1 (L) 02/16/2023   HCT 26.1 (L) 12/20/2022   MCV 91.6 12/20/2022   MCH 30.2 12/20/2022   RDW 17.0 (H) 12/20/2022   PLT 239 12/20/2022   Last metabolic panel Lab Results  Component Value Date   GLUCOSE 107 (H) 12/21/2022   NA 135 12/21/2022   K 3.9 12/21/2022   CL 103 12/21/2022   CO2 25 12/21/2022   BUN 7 12/21/2022   CREATININE 1.13 12/21/2022   GFRNONAA >60 12/21/2022   CALCIUM 8.4 (L) 12/21/2022   PHOS 4.3 12/20/2022   PROT 6.1 (L) 12/16/2022   ALBUMIN 2.9 (L) 12/20/2022   LABGLOB 2.2 08/05/2022   AGRATIO 1.7 08/05/2022   BILITOT 0.6 12/16/2022   ALKPHOS 45 12/16/2022   AST 24 12/16/2022   ALT 13 12/16/2022   ANIONGAP 7 12/21/2022   Last lipids Lab Results  Component Value Date   CHOL 102 08/28/2022   HDL 42 08/28/2022   LDLCALC 46 08/28/2022   TRIG 71 08/28/2022   CHOLHDL 2.4 08/28/2022   Last hemoglobin A1c Lab Results  Component Value Date   HGBA1C 6.0 (H) 12/17/2022   Last thyroid functions Lab Results  Component Value Date   TSH 2.367 12/20/2022   T4TOTAL 8.9 01/26/2022   Last vitamin D Lab Results  Component Value Date   VD25OH 90.2 08/05/2022   Last vitamin B12 and Folate Lab Results  Component Value Date   VITAMINB12 507 12/20/2022   FOLATE 8.7 12/20/2022     Assessment & Plan:   Problem List Items Addressed This Visit  Primary hypertension - Primary    BP remains well-controlled on current antihypertensive regimen.  No medication changes have been made today.  Can consider addition of ACE/ARB therapy in the future in the setting of CKD, however will defer this to his nephrologist.      Diabetes mellitus (HCC)    A1c 6.0 on labs from May.  Followed by endocrinology (Dr. Fransico Him).  Diet controlled.  I have recommended that Melvin Taylor  completed diabetic eye exam.  He reports that his sister is working to schedule an appointment with ophthalmology.  Diabetes related preventative care items are otherwise up-to-date.      Nasal fracture    Nasal x-rays were obtained at his last appointment in the setting of a recent fall with nasal pain and ecchymoses on exam.  X-rays revealed minimally displaced nasal bone fractures.  No intervention or treatment is needed.  Patient reports today that nasal pain is gradually improving.      Anemia, chronic disease    Multifactorial etiology.  Currently receiving iron infusions and Aranesp.  Followed by gastroenterology.  Last seen for follow-up on 7/8.  Planning for colonoscopy and EGD.       Return in about 3 months (around 05/26/2023).    [name], MD

## 2023-02-23 NOTE — Assessment & Plan Note (Signed)
Multifactorial etiology.  Currently receiving iron infusions and Aranesp.  Followed by gastroenterology.  Last seen for follow-up on 7/8.  Planning for colonoscopy and EGD.

## 2023-02-23 NOTE — Assessment & Plan Note (Signed)
BP remains well-controlled on current antihypertensive regimen.  No medication changes have been made today.  Can consider addition of ACE/ARB therapy in the future in the setting of CKD, however will defer this to his nephrologist.

## 2023-02-23 NOTE — Telephone Encounter (Signed)
FYI

## 2023-02-23 NOTE — Patient Instructions (Signed)
It was a pleasure to see you today.  Thank you for giving [place] the opportunity to be involved in your care.  Below is a brief recap of your visit and next steps.  We will plan to see you again in 3 months.  Summary NO medication changes today I recommend scheduling an appointment with your eye doctor. Please let us know if assistance is needed Follow up in 3 months

## 2023-03-01 ENCOUNTER — Other Ambulatory Visit: Payer: Self-pay

## 2023-03-01 DIAGNOSIS — D649 Anemia, unspecified: Secondary | ICD-10-CM

## 2023-03-01 NOTE — Progress Notes (Signed)
[facility] [address] [place] [address]   Clinic Day:  03/02/2023  Referring physician: [name], MD  Patient Care Team: [name], MD as PCP - General (Internal Medicine) Laqueta Linden, MD (Inactive) as PCP - Cardiology (Cardiology) Jena Gauss Gerrit Friends, MD as Consulting Physician (Gastroenterology)   ASSESSMENT & PLAN:   Assessment: ***  Plan: ***  No orders of the defined types were placed in this encounter.     Alben Deeds Teague,acting as a Neurosurgeon for Doreatha Massed, MD.,have documented all relevant documentation on the behalf of Doreatha Massed, MD,as directed by  Doreatha Massed, MD while in the presence of Doreatha Massed, MD.   ***  Carney R Teague   7/31/20249:26 PM  CHIEF COMPLAINT/PURPOSE OF CONSULT:   Diagnosis: anemia  Current Therapy:  ***  HISTORY OF PRESENT ILLNESS:   Melvin Taylor is a 57 y.o. male presenting to clinic today for evaluation of anemia at the request of [name], MD.  His most recent Hemoglobin panel on 02/16/23 found decreased hbg at 9.1. Of note, he has stage 3b CKD.   Today, he states that he is doing well overall. His appetite level is at ***%. His energy level is at ***%.  ***He denies recent chest pain on exertion, shortness of breath on minimal exertion, pre-syncopal episodes, or palpitations. ***He had not noticed any recent bleeding such as epistaxis, hematuria or hematochezia ***The patient denies over the counter NSAID ingestion. He is not *** on antiplatelets agents. His last colonoscopy was *** ***He had no prior history or diagnosis of cancer. He denies any family history of cancer.  *** His age appropriate screening programs are up-to-date. ***He denies any pica and eats a variety of diet. ***He never donated blood or received blood transfusion. ***The patient was prescribed oral iron supplements and he takes ***  PAST MEDICAL HISTORY:   Past Medical  History: Past Medical History:  Diagnosis Date   Bipolar 1 disorder (HCC)    depression/anxiety   Bone spur    left heel   Cervical radiculopathy    Chronic back pain    Chronic chest wall pain    Chronic neck pain    Coronary artery disease    a. s/p CABG in 03/2016 with LIMA-LAD, SVG-D1, SVG-RCA, and Seq SVG-mid and distal OM   Diabetes mellitus    Type II   Hallucinations    "long history of them"   Headache(784.0)    History of gout    HTN (hypertension)    Insomnia    Neuropathy    Pain management    Polysubstance abuse (HCC)    Right leg pain    chronic   Schizophrenia (HCC)    Seizures (HCC)    last sz between July 5-9th, 2016; epilepsy   Sleep apnea    Stroke (HCC)    Transfusion of blood product refused for religious reason     Surgical History: Past Surgical History:  Procedure Laterality Date   ANTERIOR CERVICAL DECOMP/DISCECTOMY FUSION N/A 12/14/2015   Procedure: ANTERIOR CERVICAL DECOMPRESSION/DISCECTOMY FUSION CERVICAL FIVE -SIX;  Surgeon: Julio Sicks, MD;  Location: [facility] NEURO ORS;  Service: Neurosurgery;  Laterality: N/A;   BIOPSY  05/07/2015   Procedure: BIOPSY (Gastric);  Surgeon: Corbin Ade, MD;  Location: [facility] ORS;  Service: Endoscopy;;   CARDIAC CATHETERIZATION N/A 03/07/2016   Procedure: Left Heart Cath and Coronary Angiography;  Surgeon: Lyn Records, MD;  Location: [facility] INVASIVE  CV LAB;  Service: Cardiovascular;  Laterality: N/A;   COLONOSCOPY WITH PROPOFOL N/A 05/07/2015   [id number] diverticulosis, multiple colon polyps removed, tubular adenoma, serrated colon polyp. Next colonoscopy October 2019   COLONOSCOPY WITH PROPOFOL N/A 08/02/2018   Procedure: COLONOSCOPY WITH PROPOFOL;  Surgeon: Corbin Ade, MD;  Location: [facility] ENDO SUITE;  Service: Endoscopy;  Laterality: N/A;  12:00pm   CORONARY ARTERY BYPASS GRAFT N/A 03/28/2016   Procedure: CORONARY ARTERY BYPASS GRAFTING (CABG) x 5 USING GREATER SAPHENOUS VEIN;  Surgeon: Alleen Borne, MD;   Location: [facility] OR;  Service: Open Heart Surgery;  Laterality: N/A;   ENDOVEIN HARVEST OF GREATER SAPHENOUS VEIN Right 03/28/2016   Procedure: ENDOVEIN HARVEST OF GREATER SAPHENOUS VEIN;  Surgeon: Alleen Borne, MD;  Location: [facility] OR;  Service: Open Heart Surgery;  Laterality: Right;   ESOPHAGEAL DILATION N/A 05/07/2015   Procedure: ESOPHAGEAL DILATION WITH 56FR MALONEY DILATOR;  Surgeon: Corbin Ade, MD;  Location: [facility] ORS;  Service: Endoscopy;  Laterality: N/A;   ESOPHAGOGASTRODUODENOSCOPY (EGD) WITH PROPOFOL N/A 05/07/2015   RMR: Status post dilation of normal esophagus. Gastritis.   IR CT HEAD LTD  12/17/2020   IR CT HEAD LTD  12/17/2020   IR INTRA CRAN STENT  12/17/2020   IR PERCUTANEOUS ART THROMBECTOMY/INFUSION INTRACRANIAL INC DIAG ANGIO  12/17/2020   IR RADIOLOGIST EVAL & MGMT  03/25/2021   KNEE SURGERY Left    arthroscopy   MANDIBLE FRACTURE SURGERY     POLYPECTOMY  05/07/2015   Procedure: POLYPECTOMY (Hepatic Flexure, Distal Transverse Colon, Rectal);  Surgeon: Corbin Ade, MD;  Location: [facility]S;  Service: Endoscopy;;   RADIOLOGY WITH ANESTHESIA N/A 12/16/2020   Procedure: IR WITH ANESTHESIA;  Surgeon: Julieanne Cotton, MD;  Location: [facility] OR;  Service: Radiology;  Laterality: N/A;   TEE WITHOUT CARDIOVERSION N/A 03/28/2016   Procedure: TRANSESOPHAGEAL ECHOCARDIOGRAM (TEE);  Surgeon: Alleen Borne, MD;  Location: [facility] OR;  Service: Open Heart Surgery;  Laterality: N/A;   TONSILLECTOMY     TRIGGER FINGER RELEASE Left 10/15/2021   Procedure: RELEASE TRIGGER FINGER/A-1 PULLEY left middle or long finger;  Surgeon: Vickki Hearing, MD;  Location: [facility] ORS;  Service: Orthopedics;  Laterality: Left;    Social History: Social History   Socioeconomic History   Marital status: Single    Spouse name: Not on file   Number of children: Not on file   Years of education: 11th grade   Highest education level: Not on file  Occupational History   Occupation: unemployed    Employer: NOT EMPLOYED   Tobacco Use   Smoking status: Former    Current packs/day: 0.50    Average packs/day: 0.5 packs/day for 30.0 years (15.0 ttl pk-yrs)    Types: Cigarettes   Smokeless tobacco: Never   Tobacco comments:    3 cigs per day  Vaping Use   Vaping status: Never Used  Substance and Sexual Activity   Alcohol use: Not Currently   Drug use: Not Currently    Types: Marijuana   Sexual activity: Not Currently  Other Topics Concern   Not on file  Social History Narrative   09/13/21 Lives alone, caregiver daily, manages meds   Drinks a cup of coffee a day   Social Determinants of Health   Financial Resource Strain: Not on file  Food Insecurity: No Food Insecurity (12/16/2022)   Hunger Vital Sign    Worried About Running Out of Food in the Last Year: Never true  Ran Out of Food in the Last Year: Never true  Transportation Needs: No Transportation Needs (12/16/2022)   PRAPARE - Administrator, Civil Service (Medical): No    Lack of Transportation (Non-Medical): No  Physical Activity: Not on file  Stress: Not on file  Social Connections: Not on file  Intimate Partner Violence: Not At Risk (12/16/2022)   Humiliation, Afraid, Rape, and Kick questionnaire    Fear of Current or Ex-Partner: No    Emotionally Abused: No    Physically Abused: No    Sexually Abused: No    Family History: Family History  Problem Relation Age of Onset   Diabetes Father    Hypertension Father    Sleep apnea Father    Stroke Sister    Arthritis Other    Diabetes Other    Asthma Other     Current Medications:  Current Outpatient Medications:    ACCU-CHEK GUIDE test strip, USE AS DIRECTED TOoMONITOR BLOOD SUGAR (4)mTIMES DAILY.e, Disp: , Rfl:    amLODipine (NORVASC) 10 MG tablet, Take 10 mg by mouth daily., Disp: , Rfl:    atorvastatin (LIPITOR) 80 MG tablet, Take 1 tablet (80 mg total) by mouth at bedtime., Disp: 30 tablet, Rfl: 0   divalproex (DEPAKOTE) 250 MG DR tablet, Take 1 tablet (250 mg  total) by mouth 2 (two) times daily., Disp: 120 tablet, Rfl: 1   escitalopram (LEXAPRO) 20 MG tablet, Take 1 tablet (20 mg total) by mouth daily., Disp: 30 tablet, Rfl: 0   ferrous sulfate 325 (65 FE) MG tablet, Take 325 mg by mouth daily., Disp: , Rfl:    insulin glargine (LANTUS SOLOSTAR) 100 UNIT/ML Solostar Pen, Inject into the skin., Disp: , Rfl:    insulin lispro (HUMALOG) 100 UNIT/ML injection, Inject into the skin., Disp: , Rfl:    isosorbide mononitrate (IMDUR) 30 MG 24 hr tablet, Take 30 mg by mouth daily., Disp: , Rfl:    lactulose (CHRONULAC) 10 GM/15ML solution, Take 30 mLs (20 g total) by mouth 2 (two) times daily., Disp: 946 mL, Rfl: 2   lamoTRIgine (LAMICTAL) 25 MG tablet, TAKE 2 TABLETS BY MOUTH TWICE DAILY. (Patient taking differently: Take 50 mg by mouth 2 (two) times daily. 15 mg every other day), Disp: 360 tablet, Rfl: 1   mirtazapine (REMERON) 15 MG tablet, Take 15 mg by mouth at bedtime., Disp: , Rfl:    Multiple Vitamin (MULTIVITAMIN WITH MINERALS) TABS tablet, Take 1 tablet by mouth daily., Disp: , Rfl:    risperiDONE (RISPERDAL) 2 MG tablet, Take by mouth., Disp: , Rfl:    ticagrelor (BRILINTA) 90 MG TABS tablet, Take 1 tablet (90 mg total) by mouth 2 (two) times daily., Disp: 60 tablet, Rfl: 5   Vitamin D, Ergocalciferol, (DRISDOL) 1.25 MG (50000 UNIT) CAPS capsule, Take 1 capsule (50,000 Units total) by mouth every 7 (seven) days., Disp: 5 capsule, Rfl: 0   Allergies: Allergies  Allergen Reactions   Aspirin Other (See Comments), Swelling and Itching    Facial swelling  Facial swelling , Reaction:  Facial swelling    REVIEW OF SYSTEMS:   Review of Systems  Constitutional:  Negative for chills, fatigue and fever.  HENT:   Negative for lump/mass, mouth sores, nosebleeds, sore throat and trouble swallowing.   Eyes:  Negative for eye problems.  Respiratory:  Negative for cough and shortness of breath.   Cardiovascular:  Negative for chest pain, leg swelling and  palpitations.  Gastrointestinal:  Negative for abdominal  pain, constipation, diarrhea, nausea and vomiting.  Genitourinary:  Negative for bladder incontinence, difficulty urinating, dysuria, frequency, hematuria and nocturia.   Musculoskeletal:  Negative for arthralgias, back pain, flank pain, myalgias and neck pain.  Skin:  Negative for itching and rash.  Neurological:  Negative for dizziness, headaches and numbness.  Hematological:  Does not bruise/bleed easily.  Psychiatric/Behavioral:  Negative for depression, sleep disturbance and suicidal ideas. The patient is not nervous/anxious.   All other systems reviewed and are negative.    VITALS:   There were no vitals taken for this visit.  Wt Readings from Last 3 Encounters:  02/23/23 134 lb 9.6 oz (61.1 kg)  02/07/23 116 lb (52.6 kg)  01/26/23 116 lb 3.2 oz (52.7 kg)    There is no height or weight on file to calculate BMI.   PHYSICAL EXAM:   Physical Exam Vitals and nursing note reviewed. Exam conducted with a chaperone present.  Constitutional:      Appearance: Normal appearance.  Cardiovascular:     Rate and Rhythm: Normal rate and regular rhythm.     Pulses: Normal pulses.     Heart sounds: Normal heart sounds.  Pulmonary:     Effort: Pulmonary effort is normal.     Breath sounds: Normal breath sounds.  Abdominal:     Palpations: Abdomen is soft. There is no hepatomegaly, splenomegaly or mass.     Tenderness: There is no abdominal tenderness.  Musculoskeletal:     Right lower leg: No edema.     Left lower leg: No edema.  Lymphadenopathy:     Cervical: No cervical adenopathy.     Right cervical: No superficial, deep or posterior cervical adenopathy.    Left cervical: No superficial, deep or posterior cervical adenopathy.     Upper Body:     Right upper body: No supraclavicular or axillary adenopathy.     Left upper body: No supraclavicular or axillary adenopathy.  Neurological:     General: No focal deficit  present.     Mental Status: He is alert and oriented to person, place, and time.  Psychiatric:        Mood and Affect: Mood normal.        Behavior: Behavior normal.     LABS:      Latest Ref Rng & Units 02/16/2023    8:28 AM 02/01/2023    8:04 AM 01/19/2023    8:37 AM  CBC  Hemoglobin 13.0 - 17.0 g/dL 9.1  9.5  41.3       Latest Ref Rng & Units 12/21/2022    9:35 AM 12/20/2022    8:18 AM 12/19/2022    5:53 AM  CMP  Glucose 70 - 99 mg/dL 244  95  65   BUN 6 - 20 mg/dL 7  8  7    Creatinine 0.61 - 1.24 mg/dL 0.10  2.72  5.36   Sodium 135 - 145 mmol/L 135  136  137   Potassium 3.5 - 5.1 mmol/L 3.9  4.2  4.3   Chloride 98 - 111 mmol/L 103  106  106   CO2 22 - 32 mmol/L 25  25  26    Calcium 8.9 - 10.3 mg/dL 8.4  8.6  8.7      No results found for: "CEA1", "CEA" / No results found for: "CEA1", "CEA" No results found for: "PSA1" No results found for: "UYQ034" No results found for: "CAN125"  No results found for: "TOTALPROTELP", "ALBUMINELP", "A1GS", "A2GS", "BETS", "BETA2SER", "  GAMS", "MSPIKE", "SPEI" Lab Results  Component Value Date   TIBC 274 11/10/2022   FERRITIN 70 11/10/2022   IRONPCTSAT 25 11/10/2022   No results found for: "LDH"   STUDIES:   DG Shoulder Right  Result Date: 02/08/2023 X-rays of the right shoulder were obtained in clinic today.  No acute injury.  Well-maintained glenohumeral joint space.  There does appear to be some proximal humeral migration.  Minimal osteophytes.  No bony lesions.  Impression: Negative right shoulder x-rays, with some proximal humeral migration

## 2023-03-02 ENCOUNTER — Inpatient Hospital Stay (HOSPITAL_BASED_OUTPATIENT_CLINIC_OR_DEPARTMENT_OTHER): Payer: Medicaid Other | Admitting: Hematology

## 2023-03-02 ENCOUNTER — Encounter: Payer: Self-pay | Admitting: Hematology

## 2023-03-02 ENCOUNTER — Inpatient Hospital Stay: Payer: Medicaid Other | Attending: Hematology

## 2023-03-02 ENCOUNTER — Inpatient Hospital Stay: Payer: Medicaid Other

## 2023-03-02 ENCOUNTER — Encounter (HOSPITAL_COMMUNITY): Payer: Medicaid Other

## 2023-03-02 VITALS — BP 124/75 | HR 80 | Temp 98.3°F | Resp 18 | Ht 67.0 in | Wt 138.2 lb

## 2023-03-02 DIAGNOSIS — Z809 Family history of malignant neoplasm, unspecified: Secondary | ICD-10-CM

## 2023-03-02 DIAGNOSIS — D649 Anemia, unspecified: Secondary | ICD-10-CM | POA: Insufficient documentation

## 2023-03-02 DIAGNOSIS — N1832 Chronic kidney disease, stage 3b: Secondary | ICD-10-CM | POA: Diagnosis present

## 2023-03-02 DIAGNOSIS — Z87891 Personal history of nicotine dependence: Secondary | ICD-10-CM | POA: Diagnosis not present

## 2023-03-02 DIAGNOSIS — D509 Iron deficiency anemia, unspecified: Secondary | ICD-10-CM | POA: Diagnosis not present

## 2023-03-02 DIAGNOSIS — Z79620 Long term (current) use of immunosuppressive biologic: Secondary | ICD-10-CM | POA: Insufficient documentation

## 2023-03-02 DIAGNOSIS — D631 Anemia in chronic kidney disease: Secondary | ICD-10-CM | POA: Diagnosis present

## 2023-03-02 DIAGNOSIS — Z8673 Personal history of transient ischemic attack (TIA), and cerebral infarction without residual deficits: Secondary | ICD-10-CM | POA: Insufficient documentation

## 2023-03-02 LAB — LACTATE DEHYDROGENASE: LDH: 200 U/L — ABNORMAL HIGH (ref 98–192)

## 2023-03-02 LAB — VITAMIN B12: Vitamin B-12: 436 pg/mL (ref 180–914)

## 2023-03-02 LAB — IRON AND TIBC
Iron: 150 ug/dL (ref 45–182)
Saturation Ratios: 48 % — ABNORMAL HIGH (ref 17.9–39.5)
TIBC: 313 ug/dL (ref 250–450)
UIBC: 163 ug/dL

## 2023-03-02 LAB — CBC WITH DIFFERENTIAL/PLATELET
Abs Immature Granulocytes: 0.01 10*3/uL (ref 0.00–0.07)
Basophils Absolute: 0 10*3/uL (ref 0.0–0.1)
Basophils Relative: 1 %
Eosinophils Absolute: 0.1 10*3/uL (ref 0.0–0.5)
Eosinophils Relative: 2 %
HCT: 34.8 % — ABNORMAL LOW (ref 39.0–52.0)
Hemoglobin: 11.3 g/dL — ABNORMAL LOW (ref 13.0–17.0)
Immature Granulocytes: 0 %
Lymphocytes Relative: 56 %
Lymphs Abs: 2 10*3/uL (ref 0.7–4.0)
MCH: 31.5 pg (ref 26.0–34.0)
MCHC: 32.5 g/dL (ref 30.0–36.0)
MCV: 96.9 fL (ref 80.0–100.0)
Monocytes Absolute: 0.4 10*3/uL (ref 0.1–1.0)
Monocytes Relative: 10 %
Neutro Abs: 1.1 10*3/uL — ABNORMAL LOW (ref 1.7–7.7)
Neutrophils Relative %: 31 %
Platelets: 154 10*3/uL (ref 150–400)
RBC: 3.59 MIL/uL — ABNORMAL LOW (ref 4.22–5.81)
RDW: 17.2 % — ABNORMAL HIGH (ref 11.5–15.5)
WBC: 3.6 10*3/uL — ABNORMAL LOW (ref 4.0–10.5)
nRBC: 0 % (ref 0.0–0.2)

## 2023-03-02 LAB — RETICULOCYTES
Immature Retic Fract: 7.4 % (ref 2.3–15.9)
RBC.: 3.53 MIL/uL — ABNORMAL LOW (ref 4.22–5.81)
Retic Count, Absolute: 92.8 10*3/uL (ref 19.0–186.0)
Retic Ct Pct: 2.6 % (ref 0.4–3.1)

## 2023-03-02 LAB — FERRITIN: Ferritin: 220 ng/mL (ref 24–336)

## 2023-03-02 LAB — TSH: TSH: 2.703 u[IU]/mL (ref 0.350–4.500)

## 2023-03-02 LAB — FOLATE: Folate: 14 ng/mL (ref >5.9)

## 2023-03-02 NOTE — Patient Instructions (Signed)
You were seen and examined today by Dr. Ellin Saba. Dr. Ellin Saba is a hematologist, meaning that he specializes in blood abnormalities. Dr. Ellin Saba discussed your past medical history, family history of cancers/blood conditions and the events that led to you being here today.  You were referred to Dr. Ellin Saba due to anemia (low hemoglobin).  Dr. Ellin Saba has recommended additional labs today for further evaluation.  Follow-up as scheduled.

## 2023-03-02 NOTE — Progress Notes (Signed)
Patient here today with office visit with Dr Ellin Saba and possible Procrit injenction. Patient's Hgb 11.3, no injection needed. Patient discharged after office visit.

## 2023-03-07 ENCOUNTER — Other Ambulatory Visit: Payer: Self-pay | Admitting: Internal Medicine

## 2023-03-08 ENCOUNTER — Telehealth: Payer: Self-pay | Admitting: Internal Medicine

## 2023-03-08 NOTE — Telephone Encounter (Signed)
Patient sister called back to our office and name of medicine amLODipine (NORVASC) 10 MG tablet [[id number]]  and Vitamin D, Ergocalciferol, (DRISDOL) 1.25 MG (50000 UNIT) CAPS capsule [[id number]   [facility] trying to do the bubble wrap.   Pharmacy: [facility]

## 2023-03-08 NOTE — Telephone Encounter (Signed)
We have not received ifobt which may be out of date. Ifobt on back order. Tammy, at this time, I would offer patient to either complete hemoccults, or schedule TCS+/-EGD (previously reluctant which is why we were checking for blood in the stool first), offer return ov, or he can return as needed. I would strongly advise he continue to follow with hematology for anemia.

## 2023-03-08 NOTE — Telephone Encounter (Signed)
Prescription Request  03/08/2023  LOV: 02/23/2023  What is the name of the medication or equipment? ticagrelor (BRILINTA) 90 MG TABS tablet  (no longer gets from other provider this was the hospitalist provider)   NEEDS ANOTHER MEDICINE DOES NOT KNOW WHAT NAME OF IT IS PATIENT ASKED FOR NURSE TO DR Durwin Nora TO CONTACT [facility]  ASKED FOR KATHY IN THE PHARMACY AND FIND OUT WHICH 2 MEDICATIONS WERE SENT OVER TO REFILL.    Have you contacted your pharmacy to request a refill? Yes   Which pharmacy would you like this sent to?  [facility]   Patient notified that their request is being sent to the clinical staff for review and that they should receive a response within 2 business days.   Please advise at Mobile [phone] (mobile)   P

## 2023-03-09 NOTE — Telephone Encounter (Signed)
Lmom for return call.  

## 2023-03-09 NOTE — Telephone Encounter (Signed)
Spoke to [facility] and she has ok'd having the pt complete the hemoccult cards. Mailed them out to the pt today.

## 2023-03-10 ENCOUNTER — Other Ambulatory Visit: Payer: Self-pay

## 2023-03-10 MED ORDER — ALLOPURINOL 100 MG PO TABS: 100.0000 mg | ORAL_TABLET | Freq: Every day | ORAL | 0 refills | Status: DC | PRN

## 2023-03-10 MED ORDER — VITAMIN D3 50 MCG (2000 UT) PO CAPS: 2000.0000 [IU] | ORAL_CAPSULE | Freq: Every day | ORAL | 3 refills | Status: AC

## 2023-03-10 NOTE — Telephone Encounter (Signed)
noted 

## 2023-03-10 NOTE — Telephone Encounter (Signed)
Sent rxs in and also provided script for Vitamin d otc in order to get pill packs.

## 2023-03-16 ENCOUNTER — Inpatient Hospital Stay (HOSPITAL_BASED_OUTPATIENT_CLINIC_OR_DEPARTMENT_OTHER): Payer: Medicaid Other | Admitting: Oncology

## 2023-03-16 ENCOUNTER — Inpatient Hospital Stay: Payer: Medicaid Other

## 2023-03-16 VITALS — BP 119/71 | HR 64 | Temp 98.3°F | Resp 16

## 2023-03-16 DIAGNOSIS — D638 Anemia in other chronic diseases classified elsewhere: Secondary | ICD-10-CM

## 2023-03-16 DIAGNOSIS — N1832 Chronic kidney disease, stage 3b: Secondary | ICD-10-CM

## 2023-03-16 DIAGNOSIS — D631 Anemia in chronic kidney disease: Secondary | ICD-10-CM

## 2023-03-16 DIAGNOSIS — D649 Anemia, unspecified: Secondary | ICD-10-CM | POA: Diagnosis not present

## 2023-03-16 DIAGNOSIS — N184 Chronic kidney disease, stage 4 (severe): Secondary | ICD-10-CM

## 2023-03-16 LAB — CBC
HCT: 30.1 % — ABNORMAL LOW (ref 39.0–52.0)
Hemoglobin: 9.9 g/dL — ABNORMAL LOW (ref 13.0–17.0)
MCH: 31.4 pg (ref 26.0–34.0)
MCHC: 32.9 g/dL (ref 30.0–36.0)
MCV: 95.6 fL (ref 80.0–100.0)
Platelets: 169 10*3/uL (ref 150–400)
RBC: 3.15 MIL/uL — ABNORMAL LOW (ref 4.22–5.81)
RDW: 15 % (ref 11.5–15.5)
WBC: 4 10*3/uL (ref 4.0–10.5)
nRBC: 0 % (ref 0.0–0.2)

## 2023-03-16 MED ORDER — EPOETIN ALFA-EPBX 10000 UNIT/ML IJ SOLN: 5000.0000 [IU] | Freq: Once | INTRAMUSCULAR | Status: DC

## 2023-03-16 MED ORDER — EPOETIN ALFA-EPBX 3000 UNIT/ML IJ SOLN
5000.0000 [IU] | Freq: Once | INTRAMUSCULAR | Status: AC
  Administered 2023-03-16: 5000 [IU] via SUBCUTANEOUS
  Filled 2023-03-16: qty 1.67

## 2023-03-16 NOTE — Patient Instructions (Signed)
[facility]  Discharge Instructions: Thank you for choosing [facility] to provide your oncology and hematology care.  If you have a lab appointment with the Cancer Center - please note that after April 8th, 2024, all labs will be drawn in the cancer center.  You do not have to check in or register with the main entrance as you have in the past but will complete your check-in in the cancer center.  Wear comfortable clothing and clothing appropriate for easy access to any Portacath or PICC line.   We strive to give you quality time with your provider. You may need to reschedule your appointment if you arrive late (15 or more minutes).  Arriving late affects you and other patients whose appointments are after yours.  Also, if you miss three or more appointments without notifying the office, you may be dismissed from the clinic at the provider's discretion.      For prescription refill requests, have your pharmacy contact our office and allow 72 hours for refills to be completed.    Today you received the following chemotherapy and/or immunotherapy agents Retacrit      To help prevent nausea and vomiting after your treatment, we encourage you to take your nausea medication as directed.  BELOW ARE SYMPTOMS THAT SHOULD BE REPORTED IMMEDIATELY: *FEVER GREATER THAN 100.4 F (38 C) OR HIGHER *CHILLS OR SWEATING *NAUSEA AND VOMITING THAT IS NOT CONTROLLED WITH YOUR NAUSEA MEDICATION *UNUSUAL SHORTNESS OF BREATH *UNUSUAL BRUISING OR BLEEDING *URINARY PROBLEMS (pain or burning when urinating, or frequent urination) *BOWEL PROBLEMS (unusual diarrhea, constipation, pain near the anus) TENDERNESS IN MOUTH AND THROAT WITH OR WITHOUT PRESENCE OF ULCERS (sore throat, sores in mouth, or a toothache) UNUSUAL RASH, SWELLING OR PAIN  UNUSUAL VAGINAL DISCHARGE OR ITCHING   Items with * indicate a potential emergency and should be followed up as soon as possible or go to the  Emergency Department if any problems should occur.  Please show the CHEMOTHERAPY ALERT CARD or IMMUNOTHERAPY ALERT CARD at check-in to the Emergency Department and triage nurse.  Should you have questions after your visit or need to cancel or reschedule your appointment, please contact [facility] [phone]  and follow the prompts.  Office hours are 8:00 a.m. to 4:30 p.m. Monday - Friday. Please note that voicemails left after 4:00 p.m. may not be returned until the following business day.  We are closed weekends and major holidays. You have access to a nurse at all times for urgent questions. Please call the main number to the clinic [phone] and follow the prompts.  For any non-urgent questions, you may also contact your provider using MyChart. We now offer e-Visits for anyone 18 and older to request care online for non-urgent symptoms. For details visit [email or web address].   Also download the MyChart app! Go to the app store, search "MyChart", open the app, select , and log in with your MyChart username and password.   

## 2023-03-16 NOTE — Progress Notes (Signed)
Patient presents today for Retacrit injection pre providers order.  Vital signs and labs reviewed by NP.  HGB 9.9.  Message received from Durenda Hurt NP okay to proceed with injection.  Stable during administration without incident; injection site WNL; see MAR for injection details.  Patient tolerated procedure well and without incident.  No questions or complaints noted at this time.

## 2023-03-16 NOTE — Progress Notes (Signed)
[facility] [address], [place] [address]   Clinic Day:  03/02/2023  Referring physician: [name], MD  Patient Care Team: [name], MD as PCP - General (Internal Medicine) Laqueta Linden, MD (Inactive) as PCP - Cardiology (Cardiology) Jena Gauss Gerrit Friends, MD as Consulting Physician (Gastroenterology)   ASSEinSSMENT & PLAN:   Assessment:  1.  Normocytic anemia: - Patient seen at the request of Dr. 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 - Colonoscopy (08/02/2018): Diverticulosis in the sigmoid and descending colon.  Otherwise normal exam. - EGD (2016): Normal esophagus, s/p Maloney dilatation.  Abnormal gastric mucosa of uncertain significance, chronic active gastritis. - Epogen started on 11/23/2021, currently receiving 5000 units every 2 weeks - Received 3 infusions of Venofer 300 mg, last one on 01/06/2023  2.  Social/family history: - Lives by himself.  Melvin Taylor has about 10 hours of aide per day helps with day-to-day activities like cooking, taking shower as Melvin Taylor had recurrent CVA.  Melvin Taylor quit cigarette smoking more than 10 years ago. - Paternal great aunt had cancer.  Plan:  1.  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 -Labs from 03/16/2023 show hemoglobin of 9.9 (11.3).  -Proceed with Retacrit 5000 units today. -Additional workup for anemia from 03/02/2023 showed no evidence of nutritional deficiencies with normal iron levels, folate, B12 and copper.  LDH was slightly elevated at 200.  Protein and immunofixation electrophoresis did not reveal evidence of monoclonal protein with an unremarkable pattern.  No evidence of hemolysis. -Recommend follow-up in 3 weeks with labs plus or minus Retacrit and follow-up with provider in 16 weeks with labs and assessment. -Discussed periodically monitoring iron levels to see if Melvin Taylor needs additional IV iron given  history.  PLAN SUMMARY: >> Return to clinic in 3 weeks for follow-up with labs plus or minus Retacrit and in 3 months for follow-up with labs, assessment and possible Retacrit.      No orders of the defined types were placed in this encounter.   I spent 20 minutes dedicated to the care of this patient (face-to-face and non-face-to-face) on the date of the encounter to include what is described in the assessment and plan.   Mauro Kaufmann, NP   8/15/20249:49 AM  CHIEF COMPLAINT/PURPOSE OF CONSULT:   Diagnosis: anemia  Current Therapy: Epogen and intermittent IV iron  HISTORY OF PRESENT ILLNESS:   Melvin Taylor is a 57 y.o. male presenting to clinic today for follow-up for anemia.  Melvin Taylor was evaluated last by Dr. Ellin Saba approximately 2 weeks ago on 03/02/2023 for workup for anemia.  Initial hemoglobin was found to be 9.1 with history of stage IIIb CKD.  Melvin Taylor continues to deny any bleeding per rectum, melena, hematochezia, hematemesis, epistaxis or gingival bleeding.  Reports feeling more tired over the past 3 to 4 days.  Appetite has been low as well.  States Melvin Taylor felt really good about 2 weeks ago and energy levels have slowly declined.  Appetite is 75% and energy levels are 75%.  Denies any pain.   PAST MEDICAL HISTORY:   Past Medical History: Past Medical History:  Diagnosis Date   Bipolar 1 disorder (HCC)    depression/anxiety   Bone spur    left heel   Cervical radiculopathy    Chronic back pain    Chronic chest wall pain    Chronic neck pain  Coronary artery disease    a. s/p CABG in 03/2016 with LIMA-LAD, SVG-D1, SVG-RCA, and Seq SVG-mid and distal OM   Diabetes mellitus    Type II   Hallucinations    "long history of them"   Headache(784.0)    History of gout    HTN (hypertension)    Insomnia    Neuropathy    Pain management    Polysubstance abuse (HCC)    Right leg pain    chronic   Schizophrenia (HCC)    Seizures (HCC)    last sz between July 5-9th,  2016; epilepsy   Sleep apnea    Stroke (HCC)    Transfusion of blood product refused for religious reason     Surgical History: Past Surgical History:  Procedure Laterality Date   ANTERIOR CERVICAL DECOMP/DISCECTOMY FUSION N/A 12/14/2015   Procedure: ANTERIOR CERVICAL DECOMPRESSION/DISCECTOMY FUSION CERVICAL FIVE -SIX;  Surgeon: Julio Sicks, MD;  Location: [facility] NEURO ORS;  Service: Neurosurgery;  Laterality: N/A;   BIOPSY  05/07/2015   Procedure: BIOPSY (Gastric);  Surgeon: Corbin Ade, MD;  Location: [facility] ORS;  Service: Endoscopy;;   CARDIAC CATHETERIZATION N/A 03/07/2016   Procedure: Left Heart Cath and Coronary Angiography;  Surgeon: Lyn Records, MD;  Location: [facility] INVASIVE CV LAB;  Service: Cardiovascular;  Laterality: N/A;   COLONOSCOPY WITH PROPOFOL N/A 05/07/2015   [id number] diverticulosis, multiple colon polyps removed, tubular adenoma, serrated colon polyp. Next colonoscopy October 2019   COLONOSCOPY WITH PROPOFOL N/A 08/02/2018   Procedure: COLONOSCOPY WITH PROPOFOL;  Surgeon: Corbin Ade, MD;  Location: [facility] ENDO SUITE;  Service: Endoscopy;  Laterality: N/A;  12:00pm   CORONARY ARTERY BYPASS GRAFT N/A 03/28/2016   Procedure: CORONARY ARTERY BYPASS GRAFTING (CABG) x 5 USING GREATER SAPHENOUS VEIN;  Surgeon: Alleen Borne, MD;  Location: [facility] OR;  Service: Open Heart Surgery;  Laterality: N/A;   ENDOVEIN HARVEST OF GREATER SAPHENOUS VEIN Right 03/28/2016   Procedure: ENDOVEIN HARVEST OF GREATER SAPHENOUS VEIN;  Surgeon: Alleen Borne, MD;  Location: [facility] OR;  Service: Open Heart Surgery;  Laterality: Right;   ESOPHAGEAL DILATION N/A 05/07/2015   Procedure: ESOPHAGEAL DILATION WITH 56FR MALONEY DILATOR;  Surgeon: Corbin Ade, MD;  Location: [facility] ORS;  Service: Endoscopy;  Laterality: N/A;   ESOPHAGOGASTRODUODENOSCOPY (EGD) WITH PROPOFOL N/A 05/07/2015   RMR: Status post dilation of normal esophagus. Gastritis.   IR CT HEAD LTD  12/17/2020   IR CT HEAD LTD  12/17/2020   IR INTRA CRAN  STENT  12/17/2020   IR PERCUTANEOUS ART THROMBECTOMY/INFUSION INTRACRANIAL INC DIAG ANGIO  12/17/2020   IR RADIOLOGIST EVAL & MGMT  03/25/2021   KNEE SURGERY Left    arthroscopy   MANDIBLE FRACTURE SURGERY     POLYPECTOMY  05/07/2015   Procedure: POLYPECTOMY (Hepatic Flexure, Distal Transverse Colon, Rectal);  Surgeon: Corbin Ade, MD;  Location: [facility]S;  Service: Endoscopy;;   RADIOLOGY WITH ANESTHESIA N/A 12/16/2020   Procedure: IR WITH ANESTHESIA;  Surgeon: Julieanne Cotton, MD;  Location: [facility] OR;  Service: Radiology;  Laterality: N/A;   TEE WITHOUT CARDIOVERSION N/A 03/28/2016   Procedure: TRANSESOPHAGEAL ECHOCARDIOGRAM (TEE);  Surgeon: Alleen Borne, MD;  Location: [facility] OR;  Service: Open Heart Surgery;  Laterality: N/A;   TONSILLECTOMY     TRIGGER FINGER RELEASE Left 10/15/2021   Procedure: RELEASE TRIGGER FINGER/A-1 PULLEY left middle or long finger;  Surgeon: Vickki Hearing, MD;  Location: [facility] ORS;  Service: Orthopedics;  Laterality: Left;  Social History: Social History   Socioeconomic History   Marital status: Single    Spouse name: Not on file   Number of children: Not on file   Years of education: 11th grade   Highest education level: Not on file  Occupational History   Occupation: unemployed    Employer: NOT EMPLOYED  Tobacco Use   Smoking status: Former    Current packs/day: 0.50    Average packs/day: 0.5 packs/day for 30.0 years (15.0 ttl pk-yrs)    Types: Cigarettes   Smokeless tobacco: Never   Tobacco comments:    3 cigs per day  Vaping Use   Vaping status: Never Used  Substance and Sexual Activity   Alcohol use: Not Currently   Drug use: Not Currently    Types: Marijuana   Sexual activity: Not Currently  Other Topics Concern   Not on file  Social History Narrative   09/13/21 Lives alone, caregiver daily, manages meds   Drinks a cup of coffee a day   Social Determinants of Health   Financial Resource Strain: Not on file  Food Insecurity: No Food  Insecurity (12/16/2022)   Hunger Vital Sign    Worried About Running Out of Food in the Last Year: Never true    Ran Out of Food in the Last Year: Never true  Transportation Needs: No Transportation Needs (12/16/2022)   PRAPARE - Administrator, Civil Service (Medical): No    Lack of Transportation (Non-Medical): No  Physical Activity: Not on file  Stress: Not on file  Social Connections: Not on file  Intimate Partner Violence: Not At Risk (12/16/2022)   Humiliation, Afraid, Rape, and Kick questionnaire    Fear of Current or Ex-Partner: No    Emotionally Abused: No    Physically Abused: No    Sexually Abused: No    Family History: Family History  Problem Relation Age of Onset   Diabetes Father    Hypertension Father    Sleep apnea Father    Stroke Sister    Arthritis Other    Diabetes Other    Asthma Other     Current Medications:  Current Outpatient Medications:    ACCU-CHEK GUIDE test strip, USE AS DIRECTED TOoMONITOR BLOOD SUGAR (4)mTIMES DAILY.e, Disp: , Rfl:    allopurinol (ZYLOPRIM) 100 MG tablet, Take 1 tablet by mouth daily., Disp: , Rfl:    allopurinol (ZYLOPRIM) 100 MG tablet, Take 1 tablet (100 mg total) by mouth daily as needed., Disp: 30 tablet, Rfl: 0   amLODipine (NORVASC) 10 MG tablet, Take 10 mg by mouth daily., Disp: , Rfl:    atorvastatin (LIPITOR) 80 MG tablet, Take 1 tablet (80 mg total) by mouth at bedtime., Disp: 30 tablet, Rfl: 0   Cholecalciferol (VITAMIN D3) 50 MCG (2000 UT) capsule, Take 1 capsule (2,000 Units total) by mouth daily., Disp: 90 capsule, Rfl: 3   divalproex (DEPAKOTE) 250 MG DR tablet, Take 1 tablet (250 mg total) by mouth 2 (two) times daily., Disp: 120 tablet, Rfl: 1   escitalopram (LEXAPRO) 20 MG tablet, Take 1 tablet (20 mg total) by mouth daily., Disp: 30 tablet, Rfl: 0   ferrous sulfate 325 (65 FE) MG tablet, Take 325 mg by mouth daily., Disp: , Rfl:    insulin glargine (LANTUS SOLOSTAR) 100 UNIT/ML Solostar Pen, Inject  into the skin., Disp: , Rfl:    insulin lispro (HUMALOG) 100 UNIT/ML injection, Inject into the skin., Disp: , Rfl:    isosorbide mononitrate (  IMDUR) 30 MG 24 hr tablet, Take 30 mg by mouth daily., Disp: , Rfl:    lactulose (CHRONULAC) 10 GM/15ML solution, Take 30 mLs (20 g total) by mouth 2 (two) times daily., Disp: 946 mL, Rfl: 2   lamoTRIgine (LAMICTAL) 25 MG tablet, TAKE 2 TABLETS BY MOUTH TWICE DAILY. (Patient taking differently: Take 50 mg by mouth 2 (two) times daily. 15 mg every other day), Disp: 360 tablet, Rfl: 1   mirtazapine (REMERON) 15 MG tablet, Take 15 mg by mouth at bedtime., Disp: , Rfl:    Multiple Vitamin (MULTIVITAMIN WITH MINERALS) TABS tablet, Take 1 tablet by mouth daily., Disp: , Rfl:    risperiDONE (RISPERDAL) 2 MG tablet, Take by mouth., Disp: , Rfl:    ticagrelor (BRILINTA) 90 MG TABS tablet, Take 1 tablet (90 mg total) by mouth 2 (two) times daily., Disp: 60 tablet, Rfl: 5   Vitamin D, Ergocalciferol, (DRISDOL) 1.25 MG (50000 UNIT) CAPS capsule, Take 1 capsule (50,000 Units total) by mouth every 7 (seven) days., Disp: 5 capsule, Rfl: 0   Allergies: Allergies  Allergen Reactions   Aspirin Other (See Comments), Swelling and Itching    Facial swelling  Facial swelling , Reaction:  Facial swelling    REVIEW OF SYSTEMS:   Review of Systems  Constitutional:  Positive for appetite change (over 2-3 days) and fatigue (slow decline).  Psychiatric/Behavioral:  Positive for sleep disturbance.      VITALS:   There were no vitals taken for this visit.  Wt Readings from Last 3 Encounters:  03/02/23 138 lb 3.2 oz (62.7 kg)  02/23/23 134 lb 9.6 oz (61.1 kg)  02/07/23 116 lb (52.6 kg)    There is no height or weight on file to calculate BMI.   PHYSICAL EXAM:   Physical Exam Constitutional:      Appearance: Normal appearance.     Comments: Patient is sitting comfortably in a wheelchair.  Cardiovascular:     Rate and Rhythm: Normal rate and regular rhythm.   Pulmonary:     Effort: Pulmonary effort is normal.     Breath sounds: Normal breath sounds.  Abdominal:     General: Bowel sounds are normal.     Palpations: Abdomen is soft.  Musculoskeletal:        General: No swelling. Normal range of motion.  Neurological:     Mental Status: Melvin Taylor is alert and oriented to person, place, and time. Mental status is at baseline.     LABS:      Latest Ref Rng & Units 03/16/2023    9:24 AM 03/02/2023    9:04 AM 02/16/2023    8:28 AM  CBC  WBC 4.0 - 10.5 K/uL 4.0  3.6    Hemoglobin 13.0 - 17.0 g/dL 9.9  16.1  9.1   Hematocrit 39.0 - 52.0 % 30.1  34.8    Platelets 150 - 400 K/uL 169  154        Latest Ref Rng & Units 12/21/2022    9:35 AM 12/20/2022    8:18 AM 12/19/2022    5:53 AM  CMP  Glucose 70 - 99 mg/dL 096  95  65   BUN 6 - 20 mg/dL 7  8  7    Creatinine 0.61 - 1.24 mg/dL 0.45  4.09  8.11   Sodium 135 - 145 mmol/L 135  136  137   Potassium 3.5 - 5.1 mmol/L 3.9  4.2  4.3   Chloride 98 - 111 mmol/L 103  106  106   CO2 22 - 32 mmol/L 25  25  26    Calcium 8.9 - 10.3 mg/dL 8.4  8.6  8.7      No results found for: "CEA1", "CEA" / No results found for: "CEA1", "CEA" No results found for: "PSA1" No results found for: "XVQ008" No results found for: "CAN125"  Lab Results  Component Value Date   TOTALPROTELP 6.0 03/02/2023   TOTALPROTELP 6.0 03/02/2023   ALBUMINELP 3.8 03/02/2023   A1GS 0.2 03/02/2023   A2GS 0.3 (L) 03/02/2023   BETS 0.7 03/02/2023   GAMS 0.9 03/02/2023   MSPIKE Not Observed 03/02/2023   SPEI Comment 03/02/2023   Lab Results  Component Value Date   TIBC 313 03/02/2023   TIBC 274 11/10/2022   FERRITIN 220 03/02/2023   FERRITIN 70 11/10/2022   IRONPCTSAT 48 (H) 03/02/2023   IRONPCTSAT 25 11/10/2022   Lab Results  Component Value Date   LDH 200 (H) 03/02/2023     STUDIES:   No results found.

## 2023-03-31 ENCOUNTER — Other Ambulatory Visit: Payer: Medicaid Other

## 2023-03-31 ENCOUNTER — Ambulatory Visit: Payer: Medicaid Other

## 2023-04-06 ENCOUNTER — Telehealth: Payer: Self-pay | Admitting: Orthopedic Surgery

## 2023-04-06 ENCOUNTER — Telehealth: Payer: Self-pay | Admitting: Internal Medicine

## 2023-04-06 NOTE — Telephone Encounter (Signed)
Patient sister LVM says pt is wanting another shot for his R shoulder and R knee. Please advise Thank you

## 2023-04-06 NOTE — Telephone Encounter (Addendum)
DR. Dallas Schimke   Patient sister called to schedule appt for her brother.  I advised her Dr. Dallas Schimke only does injections  every 3 months.  She said well he might will go ahead and do it because he is in a lot of pain ...  I have made him an appt in October 05/12/23.  She said she will contact his primary care Dr. Durwin Nora and see what he can do.     Please call her back at [phone]

## 2023-04-07 ENCOUNTER — Inpatient Hospital Stay: Payer: Medicaid Other

## 2023-04-07 ENCOUNTER — Other Ambulatory Visit: Payer: Self-pay | Admitting: Internal Medicine

## 2023-04-07 ENCOUNTER — Inpatient Hospital Stay: Payer: Medicaid Other | Attending: Hematology

## 2023-04-07 ENCOUNTER — Ambulatory Visit: Payer: Medicaid Other

## 2023-04-07 ENCOUNTER — Other Ambulatory Visit: Payer: Medicaid Other

## 2023-04-07 VITALS — BP 117/69 | HR 66 | Temp 97.2°F | Resp 17

## 2023-04-07 DIAGNOSIS — N1832 Chronic kidney disease, stage 3b: Secondary | ICD-10-CM | POA: Diagnosis present

## 2023-04-07 DIAGNOSIS — D638 Anemia in other chronic diseases classified elsewhere: Secondary | ICD-10-CM

## 2023-04-07 DIAGNOSIS — D631 Anemia in chronic kidney disease: Secondary | ICD-10-CM | POA: Insufficient documentation

## 2023-04-07 DIAGNOSIS — D649 Anemia, unspecified: Secondary | ICD-10-CM

## 2023-04-07 LAB — CBC
HCT: 32.7 % — ABNORMAL LOW (ref 39.0–52.0)
Hemoglobin: 10.5 g/dL — ABNORMAL LOW (ref 13.0–17.0)
MCH: 30.3 pg (ref 26.0–34.0)
MCHC: 32.1 g/dL (ref 30.0–36.0)
MCV: 94.2 fL (ref 80.0–100.0)
Platelets: 156 10*3/uL (ref 150–400)
RBC: 3.47 MIL/uL — ABNORMAL LOW (ref 4.22–5.81)
RDW: 14.7 % (ref 11.5–15.5)
WBC: 4 10*3/uL (ref 4.0–10.5)
nRBC: 0 % (ref 0.0–0.2)

## 2023-04-07 MED ORDER — EPOETIN ALFA-EPBX 10000 UNIT/ML IJ SOLN
5000.0000 [IU] | Freq: Once | INTRAMUSCULAR | Status: AC
  Administered 2023-04-07: 5000 [IU] via SUBCUTANEOUS
  Filled 2023-04-07: qty 1

## 2023-04-07 NOTE — Progress Notes (Signed)
Patient tolerated Retacrit 5,000 unit injection with no complaints voiced.  Site clean and dry with no bruising or swelling noted.  No complaints of pain.  Discharged with vital signs stable and no signs or symptoms of distress noted.

## 2023-04-07 NOTE — Patient Instructions (Signed)
[facility]  Discharge Instructions: Thank you for choosing [facility] to provide your oncology and hematology care.  If you have a lab appointment with the Cancer Center - please note that after April 8th, 2024, all labs will be drawn in the cancer center.  You do not have to check in or register with the main entrance as you have in the past but will complete your check-in in the cancer center.  Wear comfortable clothing and clothing appropriate for easy access to any Portacath or PICC line.   We strive to give you quality time with your provider. You may need to reschedule your appointment if you arrive late (15 or more minutes).  Arriving late affects you and other patients whose appointments are after yours.  Also, if you miss three or more appointments without notifying the office, you may be dismissed from the clinic at the provider's discretion.      For prescription refill requests, have your pharmacy contact our office and allow 72 hours for refills to be completed.    Today you received the following Retacrit.  Epoetin Alfa Injection What is this medication? EPOETIN ALFA (e POE e tin AL fa) treats low levels of red blood cells (anemia) caused by kidney disease, chemotherapy, or HIV medications. It can also be used in people who are at risk for blood loss during surgery. It works by Systems analyst make more red blood cells, which reduces the need for blood transfusions. This medicine may be used for other purposes; ask your health care provider or pharmacist if you have questions. COMMON BRAND NAME(S): Epogen, Procrit, Retacrit What should I tell my care team before I take this medication? They need to know if you have any of these conditions: Blood clots Cancer Heart disease High blood pressure On dialysis Seizures Stroke An unusual or allergic reaction to epoetin alfa, albumin, benzyl alcohol, other medications, foods, dyes, or  preservatives Pregnant or trying to get pregnant Breast-feeding How should I use this medication? This medication is injected into a vein or under the skin. It is usually given by your care team in a hospital or clinic setting. It may also be given at home. If you get this medication at home, you will be taught how to prepare and give it. Use exactly as directed. Take it as directed on the prescription label at the same time every day. Keep taking it unless your care team tells you to stop. It is important that you put your used needles and syringes in a special sharps container. Do not put them in a trash can. If you do not have a sharps container, call your pharmacist or care team to get one. A special MedGuide will be given to you by the pharmacist with each prescription and refill. Be sure to read this information carefully each time. Talk to your care team about the use of this medication in children. While this medication may be used in children as young as 1 month of age for selected conditions, precautions do apply. Overdosage: If you think you have taken too much of this medicine contact a poison control center or emergency room at once. NOTE: This medicine is only for you. Do not share this medicine with others. What if I miss a dose? If you miss a dose, take it as soon as you can. If it is almost time for your next dose, take only that dose. Do not take double or  extra doses. What may interact with this medication? Darbepoetin alfa Methoxy polyethylene glycol-epoetin beta This list may not describe all possible interactions. Give your health care provider a list of all the medicines, herbs, non-prescription drugs, or dietary supplements you use. Also tell them if you smoke, drink alcohol, or use illegal drugs. Some items may interact with your medicine. What should I watch for while using this medication? Visit your care team for regular checks on your progress. Check your blood pressure  as directed. Know what your blood pressure should be and when to contact your care team. Your condition will be monitored carefully while you are receiving this medication. You may need blood work while taking this medication. What side effects may I notice from receiving this medication? Side effects that you should report to your care team as soon as possible: Allergic reactions--skin rash, itching, hives, swelling of the face, lips, tongue, or throat Blood clot--pain, swelling, or warmth in the leg, shortness of breath, chest pain Heart attack--pain or tightness in the chest, shoulders, arms, or jaw, nausea, shortness of breath, cold or clammy skin, feeling faint or lightheaded Increase in blood pressure Rash, fever, and swollen lymph nodes Redness, blistering, peeling, or loosening of the skin, including inside the mouth Seizures Stroke--sudden numbness or weakness of the face, arm, or leg, trouble speaking, confusion, trouble walking, loss of balance or coordination, dizziness, severe headache, change in vision Side effects that usually do not require medical attention (report to your care team if they continue or are bothersome): Bone, joint, or muscle pain Cough Headache Nausea Pain, redness, or irritation at injection site This list may not describe all possible side effects. Call your doctor for medical advice about side effects. You may report side effects to FDA at 1-800-FDA-1088. Where should I keep my medication? Keep out of the reach of children and pets. Store in a refrigerator. Do not freeze. Do not shake. Protect from light. Keep this medication in the original container until you are ready to take it. See product for storage information. Get rid of any unused medication after the expiration date. To get rid of medications that are no longer needed or have expired: Take the medication to a medication take-back program. Check with your pharmacy or law enforcement to find a  location. If you cannot return the medication, ask your pharmacist or care team how to get rid of the medication safely. NOTE: This sheet is a summary. It may not cover all possible information. If you have questions about this medicine, talk to your doctor, pharmacist, or health care provider.  2024 Elsevier/Gold Standard (2021-11-19 00:00:00)       To help prevent nausea and vomiting after your treatment, we encourage you to take your nausea medication as directed.  BELOW ARE SYMPTOMS THAT SHOULD BE REPORTED IMMEDIATELY: *FEVER GREATER THAN 100.4 F (38 C) OR HIGHER *CHILLS OR SWEATING *NAUSEA AND VOMITING THAT IS NOT CONTROLLED WITH YOUR NAUSEA MEDICATION *UNUSUAL SHORTNESS OF BREATH *UNUSUAL BRUISING OR BLEEDING *URINARY PROBLEMS (pain or burning when urinating, or frequent urination) *BOWEL PROBLEMS (unusual diarrhea, constipation, pain near the anus) TENDERNESS IN MOUTH AND THROAT WITH OR WITHOUT PRESENCE OF ULCERS (sore throat, sores in mouth, or a toothache) UNUSUAL RASH, SWELLING OR PAIN  UNUSUAL VAGINAL DISCHARGE OR ITCHING   Items with * indicate a potential emergency and should be followed up as soon as possible or go to the Emergency Department if any problems should occur.  Please show the CHEMOTHERAPY ALERT CARD  or IMMUNOTHERAPY ALERT CARD at check-in to the Emergency Department and triage nurse.  Should you have questions after your visit or need to cancel or reschedule your appointment, please contact [facility] [phone]  and follow the prompts.  Office hours are 8:00 a.m. to 4:30 p.m. Monday - Friday. Please note that voicemails left after 4:00 p.m. may not be returned until the following business day.  We are closed weekends and major holidays. You have access to a nurse at all times for urgent questions. Please call the main number to the clinic [phone] and follow the prompts.  For any non-urgent questions, you may also contact your  provider using MyChart. We now offer e-Visits for anyone 25 and older to request care online for non-urgent symptoms. For details visit [email or web address].   Also download the MyChart app! Go to the app store, search "MyChart", open the app, select [place], and log in with your MyChart username and password.

## 2023-04-07 NOTE — Telephone Encounter (Signed)
Spoke with patient sister

## 2023-04-14 ENCOUNTER — Other Ambulatory Visit: Payer: Medicaid Other

## 2023-04-14 ENCOUNTER — Ambulatory Visit: Payer: Medicaid Other

## 2023-04-17 ENCOUNTER — Encounter: Payer: Self-pay | Admitting: Podiatry

## 2023-04-17 ENCOUNTER — Ambulatory Visit (INDEPENDENT_AMBULATORY_CARE_PROVIDER_SITE_OTHER): Payer: Medicaid Other | Admitting: Podiatry

## 2023-04-17 DIAGNOSIS — E1142 Type 2 diabetes mellitus with diabetic polyneuropathy: Secondary | ICD-10-CM | POA: Diagnosis not present

## 2023-04-17 DIAGNOSIS — B351 Tinea unguium: Secondary | ICD-10-CM | POA: Diagnosis not present

## 2023-04-17 DIAGNOSIS — M79674 Pain in right toe(s): Secondary | ICD-10-CM

## 2023-04-17 DIAGNOSIS — M79675 Pain in left toe(s): Secondary | ICD-10-CM

## 2023-04-17 NOTE — Progress Notes (Signed)
This patient returns to my office for at risk foot care.  This patient requires this care by a professional since this patient will be at risk due to having diabetic neuropathy.  This patient is unable to cut nails himself since the patient cannot reach his nails.These nails are painful walking and wearing shoes.  This patient presents for at risk foot care today.  General Appearance  Alert, conversant and in no acute stress.  Vascular  Dorsalis pedis and posterior tibial  pulses are palpable  bilaterally.  Capillary return is within normal limits  bilaterally. Temperature is within normal limits  bilaterally.  Neurologic  Senn-Weinstein monofilament wire test within normal limits  bilaterally. Muscle power within normal limits bilaterally.  Nails Thick disfigured discolored nails with subungual debris  from hallux to fifth toes bilaterally. No evidence of bacterial infection or drainage bilaterally.  Orthopedic  No limitations of motion  feet .  No crepitus or effusions noted.  No bony pathology or digital deformities noted.  Skin  normotropic skin with no porokeratosis noted bilaterally.  No signs of infections or ulcers noted.     Onychomycosis  Pain in right toes  Pain in left toes  Consent was obtained for treatment procedures.   Mechanical debridement of nails 1-5  bilaterally performed with a nail nipper.  Filed with dremel without incident.    Return office visit   4 months                   Told patient to return for periodic foot care and evaluation due to potential at risk complications.   Helane Gunther DPM

## 2023-04-19 ENCOUNTER — Encounter: Payer: Self-pay | Admitting: Internal Medicine

## 2023-04-19 ENCOUNTER — Ambulatory Visit (INDEPENDENT_AMBULATORY_CARE_PROVIDER_SITE_OTHER): Payer: Medicaid Other | Admitting: Internal Medicine

## 2023-04-19 VITALS — BP 97/68 | HR 67

## 2023-04-19 DIAGNOSIS — G8929 Other chronic pain: Secondary | ICD-10-CM | POA: Diagnosis not present

## 2023-04-19 DIAGNOSIS — M25561 Pain in right knee: Secondary | ICD-10-CM

## 2023-04-19 DIAGNOSIS — M25511 Pain in right shoulder: Secondary | ICD-10-CM

## 2023-04-19 MED ORDER — METHYLPREDNISOLONE ACETATE 80 MG/ML IJ SUSP
40.0000 mg | Freq: Once | INTRAMUSCULAR | Status: AC
Start: 2023-04-19 — End: 2023-04-19
  Administered 2023-04-19: 40 mg via INTRAMUSCULAR

## 2023-04-19 NOTE — Assessment & Plan Note (Signed)
Chronic knee pain Has had steroid injection Likely stiffness from poor mobility, remains in wheelchair or chair at home X-ray of knee showed mild effusion recently DepoMedrol 40 mg IM today Unable to take oral NSAIDs as he takes Brillinta Tylenol PRN for pain Follow up with Orthopedic surgeon

## 2023-04-19 NOTE — Progress Notes (Signed)
Acute Office Visit  Subjective:    Patient ID: Melvin Taylor, male    DOB: Aug 21, 1965, 57 y.o.   MRN: [id number]  Chief Complaint  Patient presents with   Knee Pain    Right knee pain    Shoulder Pain    Right shoulder pain     HPI Patient is in today for complaint of chronic right knee and shoulder pain.  He has been evaluated by  orthopedic surgeon and has had x-rays of right knee and shoulder.  He has had steroid injection in right knee and shoulder with mild relief in 07/24. He tried contacting Orthopedic surgeon for repeat intra-articular injection, but was advised to wait till 10/24.  He denies any recent injury.  His caregiver reports that he has very limited mobility and also keeps his RUE in the flexed position in wheelchair, which likely leads to stiffness of the right shoulder.  He has history of CVA and takes Brilinta.  Past Medical History:  Diagnosis Date   Bipolar 1 disorder (HCC)    depression/anxiety   Bone spur    left heel   Cervical radiculopathy    Chronic back pain    Chronic chest wall pain    Chronic neck pain    Coronary artery disease    a. s/p CABG in 03/2016 with LIMA-LAD, SVG-D1, SVG-RCA, and Seq SVG-mid and distal OM   Diabetes mellitus    Type II   Hallucinations    "long history of them"   Headache(784.0)    History of gout    HTN (hypertension)    Insomnia    Neuropathy    Pain management    Polysubstance abuse (HCC)    Right leg pain    chronic   Schizophrenia (HCC)    Seizures (HCC)    last sz between July 5-9th, 2016; epilepsy   Sleep apnea    Stroke (HCC)    Transfusion of blood product refused for religious reason     Past Surgical History:  Procedure Laterality Date   ANTERIOR CERVICAL DECOMP/DISCECTOMY FUSION N/A 12/14/2015   Procedure: ANTERIOR CERVICAL DECOMPRESSION/DISCECTOMY FUSION CERVICAL FIVE -SIX;  Surgeon: Julio Sicks, MD;  Location: [facility]URO ORS;  Service: Neurosurgery;  Laterality: N/A;   BIOPSY  05/07/2015    Procedure: BIOPSY (Gastric);  Surgeon: Corbin Ade, MD;  Location: [facility] ORS;  Service: Endoscopy;;   CARDIAC CATHETERIZATION N/A 03/07/2016   Procedure: Left Heart Cath and Coronary Angiography;  Surgeon: Lyn Records, MD;  Location: [facility] INVASIVE CV LAB;  Service: Cardiovascular;  Laterality: N/A;   COLONOSCOPY WITH PROPOFOL N/A 05/07/2015   WUJ:[id number] diverticulosis, multiple colon polyps removed, tubular adenoma, serrated colon polyp. Next colonoscopy October 2019   COLONOSCOPY WITH PROPOFOL N/A 08/02/2018   Procedure: COLONOSCOPY WITH PROPOFOL;  Surgeon: Corbin Ade, MD;  Location: [facility] ENDO SUITE;  Service: Endoscopy;  Laterality: N/A;  12:00pm   CORONARY ARTERY BYPASS GRAFT N/A 03/28/2016   Procedure: CORONARY ARTERY BYPASS GRAFTING (CABG) x 5 USING GREATER SAPHENOUS VEIN;  Surgeon: Alleen Borne, MD;  Location: [facility] OR;  Service: Open Heart Surgery;  Laterality: N/A;   ENDOVEIN HARVEST OF GREATER SAPHENOUS VEIN Right 03/28/2016   Procedure: ENDOVEIN HARVEST OF GREATER SAPHENOUS VEIN;  Surgeon: Alleen Borne, MD;  Location: [facility] OR;  Service: Open Heart Surgery;  Laterality: Right;   ESOPHAGEAL DILATION N/A 05/07/2015   Procedure: ESOPHAGEAL DILATION WITH 56FR MALONEY DILATOR;  Surgeon: Corbin Ade, MD;  Location: [facility]  ORS;  Service: Endoscopy;  Laterality: N/A;   ESOPHAGOGASTRODUODENOSCOPY (EGD) WITH PROPOFOL N/A 05/07/2015   RMR: Status post dilation of normal esophagus. Gastritis.   IR CT HEAD LTD  12/17/2020   IR CT HEAD LTD  12/17/2020   IR INTRA CRAN STENT  12/17/2020   IR PERCUTANEOUS ART THROMBECTOMY/INFUSION INTRACRANIAL INC DIAG ANGIO  12/17/2020   IR RADIOLOGIST EVAL & MGMT  03/25/2021   KNEE SURGERY Left    arthroscopy   MANDIBLE FRACTURE SURGERY     POLYPECTOMY  05/07/2015   Procedure: POLYPECTOMY (Hepatic Flexure, Distal Transverse Colon, Rectal);  Surgeon: Corbin Ade, MD;  Location: [facility] ORS;  Service: Endoscopy;;   RADIOLOGY WITH ANESTHESIA N/A 12/16/2020   Procedure: IR WITH  ANESTHESIA;  Surgeon: Julieanne Cotton, MD;  Location: [facility] OR;  Service: Radiology;  Laterality: N/A;   TEE WITHOUT CARDIOVERSION N/A 03/28/2016   Procedure: TRANSESOPHAGEAL ECHOCARDIOGRAM (TEE);  Surgeon: Alleen Borne, MD;  Location: [facility] OR;  Service: Open Heart Surgery;  Laterality: N/A;   TONSILLECTOMY     TRIGGER FINGER RELEASE Left 10/15/2021   Procedure: RELEASE TRIGGER FINGER/A-1 PULLEY left middle or long finger;  Surgeon: Vickki Hearing, MD;  Location: [facility] ORS;  Service: Orthopedics;  Laterality: Left;    Family History  Problem Relation Age of Onset   Diabetes Father    Hypertension Father    Sleep apnea Father    Stroke Sister    Arthritis Other    Diabetes Other    Asthma Other     Social History   Socioeconomic History   Marital status: Single    Spouse name: Not on file   Number of children: Not on file   Years of education: 11th grade   Highest education level: Not on file  Occupational History   Occupation: unemployed    Employer: NOT EMPLOYED  Tobacco Use   Smoking status: Former    Current packs/day: 0.50    Average packs/day: 0.5 packs/day for 30.0 years (15.0 ttl pk-yrs)    Types: Cigarettes   Smokeless tobacco: Never   Tobacco comments:    3 cigs per day  Vaping Use   Vaping status: Never Used  Substance and Sexual Activity   Alcohol use: Not Currently   Drug use: Not Currently    Types: Marijuana   Sexual activity: Not Currently  Other Topics Concern   Not on file  Social History Narrative   09/13/21 Lives alone, caregiver daily, manages meds   Drinks a cup of coffee a day   Social Determinants of Health   Financial Resource Strain: Not on file  Food Insecurity: No Food Insecurity (12/16/2022)   Hunger Vital Sign    Worried About Running Out of Food in the Last Year: Never true    Ran Out of Food in the Last Year: Never true  Transportation Needs: No Transportation Needs (12/16/2022)   PRAPARE - Scientist, research (physical sciences) (Medical): No    Lack of Transportation (Non-Medical): No  Physical Activity: Not on file  Stress: Not on file  Social Connections: Not on file  Intimate Partner Violence: Not At Risk (12/16/2022)   Humiliation, Afraid, Rape, and Kick questionnaire    Fear of Current or Ex-Partner: No    Emotionally Abused: No    Physically Abused: No    Sexually Abused: No    Outpatient Medications Prior to Visit  Medication Sig Dispense Refill   ACCU-CHEK GUIDE test strip USE AS DIRECTED  TOoMONITOR BLOOD SUGAR (4)mTIMES DAILY.e     allopurinol (ZYLOPRIM) 100 MG tablet Take 1 tablet by mouth daily.     allopurinol (ZYLOPRIM) 100 MG tablet TAKE 1 TABLET BY MOUTH DAILY. 30 tablet 0   amLODipine (NORVASC) 10 MG tablet Take 10 mg by mouth daily.     atorvastatin (LIPITOR) 80 MG tablet TAKE 1 TABLET BY MOUTH ONCE DAILY. 30 tablet 0   Cholecalciferol (VITAMIN D3) 50 MCG (2000 UT) capsule Take 1 capsule (2,000 Units total) by mouth daily. 90 capsule 3   divalproex (DEPAKOTE) 250 MG DR tablet Take 1 tablet (250 mg total) by mouth 2 (two) times daily. 120 tablet 1   escitalopram (LEXAPRO) 20 MG tablet Take 1 tablet (20 mg total) by mouth daily. 30 tablet 0   ferrous sulfate 325 (65 FE) MG tablet Take 325 mg by mouth daily.     insulin glargine (LANTUS SOLOSTAR) 100 UNIT/ML Solostar Pen Inject into the skin.     insulin lispro (HUMALOG) 100 UNIT/ML injection Inject into the skin.     isosorbide mononitrate (IMDUR) 30 MG 24 hr tablet TAKE ONE TABLET BY MOUTH ONCE DAILY. 30 tablet 0   lactulose (CHRONULAC) 10 GM/15ML solution Take 30 mLs (20 g total) by mouth 2 (two) times daily. 946 mL 2   lamoTRIgine (LAMICTAL) 25 MG tablet TAKE 2 TABLETS BY MOUTH TWICE DAILY. (Patient taking differently: Take 50 mg by mouth 2 (two) times daily. 15 mg every other day) 360 tablet 1   mirtazapine (REMERON) 15 MG tablet Take 15 mg by mouth at bedtime.     Multiple Vitamin (MULTIVITAMIN WITH MINERALS) TABS tablet Take  1 tablet by mouth daily.     risperiDONE (RISPERDAL) 2 MG tablet Take by mouth.     ticagrelor (BRILINTA) 90 MG TABS tablet Take 1 tablet (90 mg total) by mouth 2 (two) times daily. 60 tablet 5   Vitamin D, Ergocalciferol, (DRISDOL) 1.25 MG (50000 UNIT) CAPS capsule Take 1 capsule (50,000 Units total) by mouth every 7 (seven) days. 5 capsule 0   No facility-administered medications prior to visit.    Allergies  Allergen Reactions   Aspirin Other (See Comments), Swelling and Itching    Facial swelling  Facial swelling , Reaction:  Facial swelling    Review of Systems  Constitutional:  Negative for chills and fever.  Respiratory:  Negative for cough and shortness of breath.   Cardiovascular:  Negative for chest pain and palpitations.  Gastrointestinal:  Negative for diarrhea, nausea and vomiting.  Musculoskeletal:  Positive for arthralgias and gait problem.  Skin:  Negative for rash.  Neurological:  Positive for weakness. Negative for dizziness.  Psychiatric/Behavioral:  Negative for agitation and behavioral problems.        Objective:    Physical Exam Vitals reviewed.  Constitutional:      General: He is not in acute distress.    Appearance: He is not diaphoretic.     Comments: In wheelchair  HENT:     Nose: Nose normal.     Mouth/Throat:     Mouth: Mucous membranes are moist.  Eyes:     General: No scleral icterus.    Extraocular Movements: Extraocular movements intact.  Cardiovascular:     Rate and Rhythm: Normal rate and regular rhythm.     Heart sounds: Normal heart sounds. No murmur heard. Pulmonary:     Breath sounds: Normal breath sounds. No wheezing or rales.  Musculoskeletal:     Right shoulder: No  effusion. Decreased range of motion.     Cervical back: Neck supple. No tenderness.     Right knee: Swelling (Mild) present. Decreased range of motion.  Skin:    General: Skin is warm.     Findings: No rash.  Neurological:     General: No focal deficit  present.     Mental Status: He is alert and oriented to person, place, and time.  Psychiatric:        Mood and Affect: Mood normal.        Behavior: Behavior normal.     BP 97/68 (BP Location: Left Arm, Patient Position: Sitting, Cuff Size: Normal)   Pulse 67   SpO2 98%  Wt Readings from Last 3 Encounters:  03/02/23 138 lb 3.2 oz (62.7 kg)  02/23/23 134 lb 9.6 oz (61.1 kg)  02/07/23 116 lb (52.6 kg)        Assessment & Plan:   Problem List Items Addressed This Visit       Other   Knee pain - Primary    Chronic knee pain Has had steroid injection Likely stiffness from poor mobility, remains in wheelchair or chair at home X-ray of knee showed mild effusion recently DepoMedrol 40 mg IM today Unable to take oral NSAIDs as he takes Brillinta Tylenol PRN for pain Follow up with Orthopedic surgeon      Right shoulder pain    Chronic shoulder pain since 01/30/23 Has had steroid injection Likely stiffness, needs to keep shoulder in extended/relaxed position rather than constant flexion X-ray of shoulder was unremarkable DepoMedrol 40 mg IM today Unable to take oral NSAIDs as he takes Brillinta Tylenol PRN for pain Follow up with Orthopedic surgeon        Meds ordered this encounter  Medications   methylPREDNISolone acetate (DEPO-MEDROL) injection 40 mg     Anabel Halon, MD

## 2023-04-19 NOTE — Assessment & Plan Note (Signed)
Chronic shoulder pain since 01/30/23 Has had steroid injection Likely stiffness, needs to keep shoulder in extended/relaxed position rather than constant flexion X-ray of shoulder was unremarkable DepoMedrol 40 mg IM today Unable to take oral NSAIDs as he takes Brillinta Tylenol PRN for pain Follow up with Orthopedic surgeon

## 2023-04-19 NOTE — Patient Instructions (Signed)
Please take Tylenol as needed for knee and shoulder pain.  Please follow up with Orthopedic surgeon for knee and shoulder pain.

## 2023-04-24 ENCOUNTER — Other Ambulatory Visit: Payer: Self-pay | Admitting: Internal Medicine

## 2023-04-28 ENCOUNTER — Inpatient Hospital Stay: Payer: Medicaid Other

## 2023-04-28 VITALS — BP 113/80 | HR 66 | Temp 96.9°F | Resp 18

## 2023-04-28 DIAGNOSIS — N1832 Chronic kidney disease, stage 3b: Secondary | ICD-10-CM | POA: Diagnosis not present

## 2023-04-28 DIAGNOSIS — D638 Anemia in other chronic diseases classified elsewhere: Secondary | ICD-10-CM

## 2023-04-28 DIAGNOSIS — D649 Anemia, unspecified: Secondary | ICD-10-CM

## 2023-04-28 LAB — CBC
HCT: 32 % — ABNORMAL LOW (ref 39.0–52.0)
Hemoglobin: 10.8 g/dL — ABNORMAL LOW (ref 13.0–17.0)
MCH: 31 pg (ref 26.0–34.0)
MCHC: 33.8 g/dL (ref 30.0–36.0)
MCV: 92 fL (ref 80.0–100.0)
Platelets: 197 10*3/uL (ref 150–400)
RBC: 3.48 MIL/uL — ABNORMAL LOW (ref 4.22–5.81)
RDW: 15.7 % — ABNORMAL HIGH (ref 11.5–15.5)
WBC: 6 10*3/uL (ref 4.0–10.5)
nRBC: 0 % (ref 0.0–0.2)

## 2023-04-28 MED ORDER — EPOETIN ALFA-EPBX 10000 UNIT/ML IJ SOLN: 5000.0000 [IU] | Freq: Once | INTRAMUSCULAR | Status: DC

## 2023-04-28 MED ORDER — EPOETIN ALFA-EPBX 2000 UNIT/ML IJ SOLN
2000.0000 [IU] | Freq: Once | INTRAMUSCULAR | Status: AC
  Administered 2023-04-28: 2000 [IU] via SUBCUTANEOUS
  Filled 2023-04-28: qty 1

## 2023-04-28 MED ORDER — EPOETIN ALFA-EPBX 3000 UNIT/ML IJ SOLN
3000.0000 [IU] | Freq: Once | INTRAMUSCULAR | Status: AC
  Administered 2023-04-28: 3000 [IU] via SUBCUTANEOUS
  Filled 2023-04-28: qty 1

## 2023-04-28 NOTE — Progress Notes (Signed)
Retacrit injection given per orders. Patient tolerated it well without problems. Vitals stable and discharged home from clinic via wheelchair. Follow up as scheduled.  

## 2023-05-08 ENCOUNTER — Other Ambulatory Visit: Payer: Self-pay | Admitting: Adult Health

## 2023-05-08 ENCOUNTER — Other Ambulatory Visit: Payer: Self-pay | Admitting: Internal Medicine

## 2023-05-12 ENCOUNTER — Ambulatory Visit: Payer: Medicaid Other

## 2023-05-12 ENCOUNTER — Ambulatory Visit: Payer: Medicaid Other | Admitting: Orthopedic Surgery

## 2023-05-18 ENCOUNTER — Other Ambulatory Visit: Payer: Self-pay

## 2023-05-18 DIAGNOSIS — D638 Anemia in other chronic diseases classified elsewhere: Secondary | ICD-10-CM

## 2023-05-18 NOTE — Progress Notes (Signed)
Lab orders entered

## 2023-05-19 ENCOUNTER — Inpatient Hospital Stay: Payer: Medicaid Other

## 2023-05-22 ENCOUNTER — Ambulatory Visit: Payer: Medicaid Other

## 2023-05-23 ENCOUNTER — Inpatient Hospital Stay: Payer: Medicaid Other | Attending: Hematology

## 2023-05-23 VITALS — BP 115/77 | HR 67 | Temp 97.9°F

## 2023-05-23 DIAGNOSIS — D638 Anemia in other chronic diseases classified elsewhere: Secondary | ICD-10-CM

## 2023-05-23 DIAGNOSIS — D631 Anemia in chronic kidney disease: Secondary | ICD-10-CM | POA: Insufficient documentation

## 2023-05-23 DIAGNOSIS — N1832 Chronic kidney disease, stage 3b: Secondary | ICD-10-CM | POA: Insufficient documentation

## 2023-05-23 LAB — CBC WITH DIFFERENTIAL/PLATELET
Abs Immature Granulocytes: 0 10*3/uL (ref 0.00–0.07)
Basophils Absolute: 0 10*3/uL (ref 0.0–0.1)
Basophils Relative: 1 %
Eosinophils Absolute: 0.1 10*3/uL (ref 0.0–0.5)
Eosinophils Relative: 2 %
HCT: 31.8 % — ABNORMAL LOW (ref 39.0–52.0)
Hemoglobin: 10.7 g/dL — ABNORMAL LOW (ref 13.0–17.0)
Immature Granulocytes: 0 %
Lymphocytes Relative: 61 %
Lymphs Abs: 2.7 10*3/uL (ref 0.7–4.0)
MCH: 31.8 pg (ref 26.0–34.0)
MCHC: 33.6 g/dL (ref 30.0–36.0)
MCV: 94.4 fL (ref 80.0–100.0)
Monocytes Absolute: 0.5 10*3/uL (ref 0.1–1.0)
Monocytes Relative: 10 %
Neutro Abs: 1.1 10*3/uL — ABNORMAL LOW (ref 1.7–7.7)
Neutrophils Relative %: 26 %
Platelets: 179 10*3/uL (ref 150–400)
RBC: 3.37 MIL/uL — ABNORMAL LOW (ref 4.22–5.81)
RDW: 15.9 % — ABNORMAL HIGH (ref 11.5–15.5)
WBC: 4.3 10*3/uL (ref 4.0–10.5)
nRBC: 0 % (ref 0.0–0.2)

## 2023-05-23 LAB — IRON AND TIBC
Iron: 113 ug/dL (ref 45–182)
Saturation Ratios: 39 % (ref 17.9–39.5)
TIBC: 290 ug/dL (ref 250–450)
UIBC: 177 ug/dL

## 2023-05-23 LAB — VITAMIN B12: Vitamin B-12: 527 pg/mL (ref 180–914)

## 2023-05-23 LAB — FERRITIN: Ferritin: 249 ng/mL (ref 24–336)

## 2023-05-23 MED ORDER — EPOETIN ALFA-EPBX 10000 UNIT/ML IJ SOLN: 5000.0000 [IU] | Freq: Once | INTRAMUSCULAR | Status: DC

## 2023-05-23 MED ORDER — EPOETIN ALFA-EPBX 3000 UNIT/ML IJ SOLN
3000.0000 [IU] | Freq: Once | INTRAMUSCULAR | Status: AC
  Administered 2023-05-23: 3000 [IU] via SUBCUTANEOUS
  Filled 2023-05-23: qty 1

## 2023-05-23 MED ORDER — EPOETIN ALFA-EPBX 2000 UNIT/ML IJ SOLN
2000.0000 [IU] | Freq: Once | INTRAMUSCULAR | Status: AC
  Administered 2023-05-23: 2000 [IU] via SUBCUTANEOUS
  Filled 2023-05-23: qty 1

## 2023-05-23 NOTE — Progress Notes (Signed)
Patient tolerated Retacrit injection with no complaints voiced. Hgb 10.7. Site clean and dry with no bruising or swelling noted at site.  See MAR for details.  Band aid applied.  Patient stable during and after injection.  Vss with discharge and left in satisfactory condition with no s/s of distress noted. All follow ups as scheduled  Melvin Taylor [employer]

## 2023-05-23 NOTE — Patient Instructions (Signed)

## 2023-05-24 ENCOUNTER — Telehealth: Payer: Self-pay | Admitting: Internal Medicine

## 2023-05-24 NOTE — Telephone Encounter (Signed)
POA is wanting to know how his eye exam results, currently still waiting on results

## 2023-05-24 NOTE — Telephone Encounter (Signed)
Patient POA Gladstone Lighter called asked if iron results came back [phone]

## 2023-05-28 ENCOUNTER — Other Ambulatory Visit: Payer: Self-pay | Admitting: Adult Health

## 2023-05-31 LAB — METHYLMALONIC ACID, SERUM: Methylmalonic Acid, Quantitative: 242 nmol/L (ref 0–378)

## 2023-06-01 ENCOUNTER — Encounter: Payer: Self-pay | Admitting: Internal Medicine

## 2023-06-01 ENCOUNTER — Ambulatory Visit (INDEPENDENT_AMBULATORY_CARE_PROVIDER_SITE_OTHER): Payer: Medicaid Other | Admitting: Internal Medicine

## 2023-06-01 VITALS — BP 102/68 | HR 78 | Ht 67.0 in | Wt 136.4 lb

## 2023-06-01 DIAGNOSIS — M25561 Pain in right knee: Secondary | ICD-10-CM

## 2023-06-01 DIAGNOSIS — D638 Anemia in other chronic diseases classified elsewhere: Secondary | ICD-10-CM | POA: Diagnosis not present

## 2023-06-01 DIAGNOSIS — E1159 Type 2 diabetes mellitus with other circulatory complications: Secondary | ICD-10-CM

## 2023-06-01 DIAGNOSIS — F32A Depression, unspecified: Secondary | ICD-10-CM

## 2023-06-01 DIAGNOSIS — E782 Mixed hyperlipidemia: Secondary | ICD-10-CM

## 2023-06-01 DIAGNOSIS — I10 Essential (primary) hypertension: Secondary | ICD-10-CM

## 2023-06-01 DIAGNOSIS — Z2821 Immunization not carried out because of patient refusal: Secondary | ICD-10-CM

## 2023-06-01 DIAGNOSIS — G8929 Other chronic pain: Secondary | ICD-10-CM

## 2023-06-01 DIAGNOSIS — F419 Anxiety disorder, unspecified: Secondary | ICD-10-CM

## 2023-06-01 NOTE — Patient Instructions (Signed)
It was a pleasure to see you today.  Thank you for giving [place] the opportunity to be involved in your care.  Below is a brief recap of your visit and next steps.  We will plan to see you again in 3 months.  Summary No medication changes today I recommend following up with orthopedic surgery (Dr. Dallas Schimke) for your right knee Follow up in 3 months

## 2023-06-01 NOTE — Assessment & Plan Note (Signed)
He continues to endorse chronic right knee pain.  ROM is reduced.  No significant effusion is present.  Previous x-rays have not shown significant degenerative changes.  He did receive pain relief previously with intra-articular corticosteroid injection in early July.  I recommended that he follow-up with orthopedic surgery (Dr. Dallas Schimke) to discuss additional treatment options.

## 2023-06-01 NOTE — Assessment & Plan Note (Signed)
 Remains adequately controlled on current antihypertensive regimen.  No medication changes are indicated today.

## 2023-06-01 NOTE — Assessment & Plan Note (Signed)
Mood remains stable and anxiety adequately controlled with Lexapro.

## 2023-06-01 NOTE — Assessment & Plan Note (Signed)
Lipid panel updated in January.  Total cholesterol 102 and LDL 46.  He is currently prescribed atorvastatin 80 mg daily. -Repeat lipid panel at follow-up in 3 months

## 2023-06-01 NOTE — Assessment & Plan Note (Signed)
Labs from 10/22 are stable.  Followed by hematology.

## 2023-06-01 NOTE — Progress Notes (Signed)
Established Patient Office Visit  Subjective   Patient ID: Melvin Taylor, male    DOB: May 06, 1966  Age: 57 y.o. MRN: [id number]  Chief Complaint  Patient presents with   Care Management    3 month f/u   Melvin Taylor returns to care today for routine follow-up.  He was last evaluated by me on 7/25.  No medication changes were made at that time and 53-month follow-up was arranged.  In the interim, he has been evaluated by hematology/oncology and podiatry.  He presented to [facility] for an acute visit on 9/18 endorsing chronic right knee and shoulder pain.  Received IM methylprednisolone injection for pain relief.  There have otherwise been no acute interval events. Melvin Taylor endorses chronic right knee pain.  He is otherwise asymptomatic and has no acute concerns to discuss.  Past Medical History:  Diagnosis Date   Bipolar 1 disorder (HCC)    depression/anxiety   Bone spur    left heel   Cervical radiculopathy    Chronic back pain    Chronic chest wall pain    Chronic neck pain    Coronary artery disease    a. s/p CABG in 03/2016 with LIMA-LAD, SVG-D1, SVG-RCA, and Seq SVG-mid and distal OM   Diabetes mellitus    Type II   Hallucinations    "long history of them"   Headache(784.0)    History of gout    HTN (hypertension)    Insomnia    Neuropathy    Pain management    Polysubstance abuse (HCC)    Right leg pain    chronic   Schizophrenia (HCC)    Seizures (HCC)    last sz between July 5-9th, 2016; epilepsy   Sleep apnea    Stroke (HCC)    Transfusion of blood product refused for religious reason    Past Surgical History:  Procedure Laterality Date   ANTERIOR CERVICAL DECOMP/DISCECTOMY FUSION N/A 12/14/2015   Procedure: ANTERIOR CERVICAL DECOMPRESSION/DISCECTOMY FUSION CERVICAL FIVE -SIX;  Surgeon: Julio Sicks, MD;  Location: [facility] NEURO ORS;  Service: Neurosurgery;  Laterality: N/A;   BIOPSY  05/07/2015   Procedure: BIOPSY (Gastric);  Surgeon: Corbin Ade, MD;  Location: [facility]  ORS;  Service: Endoscopy;;   CARDIAC CATHETERIZATION N/A 03/07/2016   Procedure: Left Heart Cath and Coronary Angiography;  Surgeon: Lyn Records, MD;  Location: [facility] INVASIVE CV LAB;  Service: Cardiovascular;  Laterality: N/A;   COLONOSCOPY WITH PROPOFOL N/A 05/07/2015   WUJ:[id number] diverticulosis, multiple colon polyps removed, tubular adenoma, serrated colon polyp. Next colonoscopy October 2019   COLONOSCOPY WITH PROPOFOL N/A 08/02/2018   Procedure: COLONOSCOPY WITH PROPOFOL;  Surgeon: Corbin Ade, MD;  Location: [facility] ENDO SUITE;  Service: Endoscopy;  Laterality: N/A;  12:00pm   CORONARY ARTERY BYPASS GRAFT N/A 03/28/2016   Procedure: CORONARY ARTERY BYPASS GRAFTING (CABG) x 5 USING GREATER SAPHENOUS VEIN;  Surgeon: Alleen Borne, MD;  Location: [facility] OR;  Service: Open Heart Surgery;  Laterality: N/A;   ENDOVEIN HARVEST OF GREATER SAPHENOUS VEIN Right 03/28/2016   Procedure: ENDOVEIN HARVEST OF GREATER SAPHENOUS VEIN;  Surgeon: Alleen Borne, MD;  Location: [facility] OR;  Service: Open Heart Surgery;  Laterality: Right;   ESOPHAGEAL DILATION N/A 05/07/2015   Procedure: ESOPHAGEAL DILATION WITH 56FR MALONEY DILATOR;  Surgeon: Corbin Ade, MD;  Location: [facility] ORS;  Service: Endoscopy;  Laterality: N/A;   ESOPHAGOGASTRODUODENOSCOPY (EGD) WITH PROPOFOL N/A 05/07/2015   RMR: Status post dilation of normal esophagus. Gastritis.  IR CT HEAD LTD  12/17/2020   IR CT HEAD LTD  12/17/2020   IR INTRA CRAN STENT  12/17/2020   IR PERCUTANEOUS ART THROMBECTOMY/INFUSION INTRACRANIAL INC DIAG ANGIO  12/17/2020   IR RADIOLOGIST EVAL & MGMT  03/25/2021   KNEE SURGERY Left    arthroscopy   MANDIBLE FRACTURE SURGERY     POLYPECTOMY  05/07/2015   Procedure: POLYPECTOMY (Hepatic Flexure, Distal Transverse Colon, Rectal);  Surgeon: Corbin Ade, MD;  Location: [facility] ORS;  Service: Endoscopy;;   RADIOLOGY WITH ANESTHESIA N/A 12/16/2020   Procedure: IR WITH ANESTHESIA;  Surgeon: Julieanne Cotton, MD;  Location: [facility] OR;  Service:  Radiology;  Laterality: N/A;   TEE WITHOUT CARDIOVERSION N/A 03/28/2016   Procedure: TRANSESOPHAGEAL ECHOCARDIOGRAM (TEE);  Surgeon: Alleen Borne, MD;  Location: [facility] OR;  Service: Open Heart Surgery;  Laterality: N/A;   TONSILLECTOMY     TRIGGER FINGER RELEASE Left 10/15/2021   Procedure: RELEASE TRIGGER FINGER/A-1 PULLEY left middle or long finger;  Surgeon: Vickki Hearing, MD;  Location: [facility] ORS;  Service: Orthopedics;  Laterality: Left;   Social History   Tobacco Use   Smoking status: Former    Current packs/day: 0.50    Average packs/day: 0.5 packs/day for 30.0 years (15.0 ttl pk-yrs)    Types: Cigarettes   Smokeless tobacco: Never   Tobacco comments:    3 cigs per day  Vaping Use   Vaping status: Never Used  Substance Use Topics   Alcohol use: Not Currently   Drug use: Not Currently    Types: Marijuana   Family History  Problem Relation Age of Onset   Diabetes Father    Hypertension Father    Sleep apnea Father    Stroke Sister    Arthritis Other    Diabetes Other    Asthma Other    Allergies  Allergen Reactions   Aspirin Other (See Comments), Swelling and Itching    Facial swelling  Facial swelling , Reaction:  Facial swelling   Review of Systems  Musculoskeletal:  Positive for joint pain (R knee pain).     Objective:     BP 102/68   Pulse 78   Ht 5\' 7"  (1.702 m)   Wt 136 lb 6.4 oz (61.9 kg)   SpO2 99%   BMI 21.36 kg/m  BP Readings from Last 3 Encounters:  06/01/23 102/68  05/23/23 115/77  04/28/23 113/80   Physical Exam Vitals reviewed.  Constitutional:      General: He is not in acute distress.    Appearance: Normal appearance. He is not ill-appearing.  HENT:     Head: Normocephalic and atraumatic.     Right Ear: External ear normal.     Left Ear: External ear normal.     Nose: Nose normal. No congestion or rhinorrhea.     Mouth/Throat:     Mouth: Mucous membranes are moist.     Pharynx: Oropharynx is clear.  Eyes:     General: No  scleral icterus.    Extraocular Movements: Extraocular movements intact.     Conjunctiva/sclera: Conjunctivae normal.     Pupils: Pupils are equal, round, and reactive to light.  Cardiovascular:     Rate and Rhythm: Normal rate and regular rhythm.     Pulses: Normal pulses.     Heart sounds: Normal heart sounds. No murmur heard. Pulmonary:     Effort: Pulmonary effort is normal.     Breath sounds: Normal breath sounds. No wheezing, rhonchi  or rales.  Abdominal:     General: Abdomen is flat. Bowel sounds are normal. There is no distension.     Palpations: Abdomen is soft.     Tenderness: There is no abdominal tenderness.  Musculoskeletal:        General: No swelling or deformity.     Cervical back: Normal range of motion.     Comments: Reduced ROM of the right knee  Skin:    General: Skin is warm and dry.     Capillary Refill: Capillary refill takes less than 2 seconds.  Neurological:     General: No focal deficit present.     Mental Status: He is alert and oriented to person, place, and time.     Motor: No weakness.     Gait: Gait abnormal (Ambulates with rollator).  Psychiatric:        Mood and Affect: Mood normal.        Behavior: Behavior normal.        Thought Content: Thought content normal.   Last CBC Lab Results  Component Value Date   WBC 4.3 05/23/2023   HGB 10.7 (L) 05/23/2023   HCT 31.8 (L) 05/23/2023   MCV 94.4 05/23/2023   MCH 31.8 05/23/2023   RDW 15.9 (H) 05/23/2023   PLT 179 05/23/2023   Last metabolic panel Lab Results  Component Value Date   GLUCOSE 107 (H) 12/21/2022   NA 135 12/21/2022   K 3.9 12/21/2022   CL 103 12/21/2022   CO2 25 12/21/2022   BUN 7 12/21/2022   CREATININE 1.13 12/21/2022   GFRNONAA >60 12/21/2022   CALCIUM 8.4 (L) 12/21/2022   PHOS 4.3 12/20/2022   PROT 6.1 (L) 12/16/2022   ALBUMIN 2.9 (L) 12/20/2022   LABGLOB 2.2 03/02/2023   AGRATIO 1.7 03/02/2023   BILITOT 0.6 12/16/2022   ALKPHOS 45 12/16/2022   AST 24  12/16/2022   ALT 13 12/16/2022   ANIONGAP 7 12/21/2022   Last lipids Lab Results  Component Value Date   CHOL 102 08/28/2022   HDL 42 08/28/2022   LDLCALC 46 08/28/2022   TRIG 71 08/28/2022   CHOLHDL 2.4 08/28/2022   Last hemoglobin A1c Lab Results  Component Value Date   HGBA1C 6.0 (H) 12/17/2022   Last thyroid functions Lab Results  Component Value Date   TSH 2.703 03/02/2023   T4TOTAL 8.9 01/26/2022   Last vitamin D Lab Results  Component Value Date   VD25OH 90.2 08/05/2022   Last vitamin B12 and Folate Lab Results  Component Value Date   VITAMINB12 527 05/23/2023   FOLATE 14.0 03/02/2023     Assessment & Plan:   Problem List Items Addressed This Visit       Primary hypertension - Primary    Remains adequately controlled on current antihypertensive regimen.  No medication changes are indicated today.      Diabetes mellitus (HCC)    Diet controlled.  A1c 6.0 on labs from May.  Diabetes related preventative care items are up-to-date.      Chronic pain of right knee    He continues to endorse chronic right knee pain.  ROM is reduced.  No significant effusion is present.  Previous x-rays have not shown significant degenerative changes.  He did receive pain relief previously with intra-articular corticosteroid injection in early July.  I recommended that he follow-up with orthopedic surgery (Dr. Dallas Schimke) to discuss additional treatment options.      Hyperlipidemia    Lipid panel updated  in January.  Total cholesterol 102 and LDL 46.  He is currently prescribed atorvastatin 80 mg daily. -Repeat lipid panel at follow-up in 3 months      Anxiety and depression    Mood remains stable and anxiety adequately controlled with Lexapro.      Anemia, chronic disease    Labs from 10/22 are stable.  Followed by hematology.      Return in about 3 months (around 09/01/2023).   [name], MD

## 2023-06-01 NOTE — Assessment & Plan Note (Signed)
Diet controlled.  A1c 6.0 on labs from May.  Diabetes related preventative care items are up-to-date.

## 2023-06-08 ENCOUNTER — Other Ambulatory Visit (INDEPENDENT_AMBULATORY_CARE_PROVIDER_SITE_OTHER): Payer: Self-pay

## 2023-06-08 ENCOUNTER — Other Ambulatory Visit: Payer: Self-pay | Admitting: Internal Medicine

## 2023-06-08 ENCOUNTER — Encounter: Payer: Self-pay | Admitting: Orthopedic Surgery

## 2023-06-08 ENCOUNTER — Ambulatory Visit: Payer: Medicaid Other | Admitting: Orthopedic Surgery

## 2023-06-08 ENCOUNTER — Other Ambulatory Visit: Payer: Self-pay | Admitting: Orthopedic Surgery

## 2023-06-08 VITALS — BP 109/65 | HR 67 | Ht 67.0 in | Wt 136.0 lb

## 2023-06-08 DIAGNOSIS — G8929 Other chronic pain: Secondary | ICD-10-CM

## 2023-06-08 DIAGNOSIS — M25561 Pain in right knee: Secondary | ICD-10-CM | POA: Diagnosis not present

## 2023-06-08 MED ORDER — METHYLPREDNISOLONE ACETATE 40 MG/ML IJ SUSP
40.0000 mg | Freq: Once | INTRAMUSCULAR | Status: AC
  Administered 2023-06-08: 40 mg via INTRA_ARTICULAR

## 2023-06-08 NOTE — Progress Notes (Addendum)
Follow-up visit  Chief Complaint  Patient presents with   Knee Pain    For about 4 weeks has had Right knee pain   Encounter Diagnosis  Name Primary?   Chronic pain of right knee Yes    Unclear diagnosis  Patient was seen Dr. Dallas Schimke for injection every 3 months but somehow was scheduled with me  Complains of contractures both knees right knee pain x-rays negative only showing grade 1 or 2 arthritis with mild joint space narrowing symmetrically no deformity  Patient has poor gait  X-rays repeated today again they show symmetric joint space narrowing without osteophytes  Reinjected no other treatment available at this time for this patient  Injections can be done every 3 months  Patient was seen Dr. Dallas Schimke  Procedure note right knee injection   verbal consent was obtained to inject right knee joint  Timeout was completed to confirm the site of injection  The medications used were depomedrol 40 mg and 1% lidocaine 3 cc Anesthesia was provided by ethyl chloride and the skin was prepped with alcohol.  After cleaning the skin with alcohol a 20-gauge needle was used to inject the right knee joint. There were no complications. A sterile bandage was applied.

## 2023-06-08 NOTE — Addendum Note (Signed)
Addended byCaffie Damme on: 06/08/2023 11:41 AM   Modules accepted: Orders

## 2023-06-09 ENCOUNTER — Inpatient Hospital Stay: Payer: Medicaid Other

## 2023-06-14 ENCOUNTER — Other Ambulatory Visit: Payer: Self-pay

## 2023-06-14 MED ORDER — UNABLE TO FIND: 1.0000 | Freq: Every day | 0 refills | Status: DC

## 2023-06-16 ENCOUNTER — Inpatient Hospital Stay: Payer: Medicaid Other | Attending: Hematology

## 2023-06-16 ENCOUNTER — Inpatient Hospital Stay: Payer: Medicaid Other

## 2023-06-16 DIAGNOSIS — N1832 Chronic kidney disease, stage 3b: Secondary | ICD-10-CM | POA: Insufficient documentation

## 2023-06-16 DIAGNOSIS — D631 Anemia in chronic kidney disease: Secondary | ICD-10-CM | POA: Insufficient documentation

## 2023-06-16 DIAGNOSIS — D649 Anemia, unspecified: Secondary | ICD-10-CM

## 2023-06-16 LAB — CBC
HCT: 32.9 % — ABNORMAL LOW (ref 39.0–52.0)
Hemoglobin: 11.2 g/dL — ABNORMAL LOW (ref 13.0–17.0)
MCH: 31.7 pg (ref 26.0–34.0)
MCHC: 34 g/dL (ref 30.0–36.0)
MCV: 93.2 fL (ref 80.0–100.0)
Platelets: 218 10*3/uL (ref 150–400)
RBC: 3.53 MIL/uL — ABNORMAL LOW (ref 4.22–5.81)
RDW: 15.2 % (ref 11.5–15.5)
WBC: 7.3 10*3/uL (ref 4.0–10.5)
nRBC: 0 % (ref 0.0–0.2)

## 2023-06-16 NOTE — Progress Notes (Signed)
Patients Hgb 11.2. Per parameters pt does not need Retacrit injection today. Pt and caretaker made aware and all questions answered at this time. All follow ups as scheduled.   Gaspard Isbell [employer]

## 2023-06-20 ENCOUNTER — Other Ambulatory Visit: Payer: Self-pay

## 2023-06-20 MED ORDER — UNABLE TO FIND: 1.0000 | Freq: Every day | 0 refills | Status: DC

## 2023-06-20 MED ORDER — UNABLE TO FIND: 1.0000 | Freq: Every day | 0 refills | Status: AC

## 2023-07-07 ENCOUNTER — Inpatient Hospital Stay: Payer: Self-pay

## 2023-07-07 ENCOUNTER — Inpatient Hospital Stay: Payer: Medicaid Other | Attending: Hematology | Admitting: Oncology

## 2023-07-07 ENCOUNTER — Other Ambulatory Visit: Payer: Self-pay | Admitting: Internal Medicine

## 2023-07-07 ENCOUNTER — Inpatient Hospital Stay: Payer: Medicaid Other

## 2023-07-07 VITALS — BP 116/73 | HR 63 | Temp 98.1°F | Resp 17

## 2023-07-07 DIAGNOSIS — D638 Anemia in other chronic diseases classified elsewhere: Secondary | ICD-10-CM

## 2023-07-07 DIAGNOSIS — N1832 Chronic kidney disease, stage 3b: Secondary | ICD-10-CM | POA: Diagnosis present

## 2023-07-07 DIAGNOSIS — D61818 Other pancytopenia: Secondary | ICD-10-CM | POA: Diagnosis not present

## 2023-07-07 DIAGNOSIS — Z79899 Other long term (current) drug therapy: Secondary | ICD-10-CM | POA: Insufficient documentation

## 2023-07-07 DIAGNOSIS — D631 Anemia in chronic kidney disease: Secondary | ICD-10-CM | POA: Diagnosis present

## 2023-07-07 DIAGNOSIS — D649 Anemia, unspecified: Secondary | ICD-10-CM

## 2023-07-07 LAB — CBC
HCT: 31.2 % — ABNORMAL LOW (ref 39.0–52.0)
Hemoglobin: 9.9 g/dL — ABNORMAL LOW (ref 13.0–17.0)
MCH: 30.7 pg (ref 26.0–34.0)
MCHC: 31.7 g/dL (ref 30.0–36.0)
MCV: 96.9 fL (ref 80.0–100.0)
Platelets: 141 10*3/uL — ABNORMAL LOW (ref 150–400)
RBC: 3.22 MIL/uL — ABNORMAL LOW (ref 4.22–5.81)
RDW: 15.5 % (ref 11.5–15.5)
WBC: 3.9 10*3/uL — ABNORMAL LOW (ref 4.0–10.5)
nRBC: 0 % (ref 0.0–0.2)

## 2023-07-07 MED ORDER — EPOETIN ALFA-EPBX 10000 UNIT/ML IJ SOLN
5000.0000 [IU] | Freq: Once | INTRAMUSCULAR | Status: AC
  Administered 2023-07-07: 5000 [IU] via SUBCUTANEOUS
  Filled 2023-07-07: qty 1

## 2023-07-07 NOTE — Progress Notes (Signed)
[facility] [address], [place] [address]   Clinic Day:  03/02/2023  Referring physician: [name], MD  Patient Care Team: [name], MD as PCP - General (Internal Medicine) Laqueta Linden, MD (Inactive) as PCP - Cardiology (Cardiology) Jena Gauss Gerrit Friends, MD as Consulting Physician (Gastroenterology)  ASSEinSSMENT & PLAN:   Assessment:  1.  Normocytic anemia: - Patient seen at the request of Dr. 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 - Colonoscopy (08/02/2018): Diverticulosis in the sigmoid and descending colon.  Otherwise normal exam. - EGD (2016): Normal esophagus, s/p Maloney dilatation.  Abnormal gastric mucosa of uncertain significance, chronic active gastritis. - Epogen started on 11/23/2021, currently receiving 5000 units every 2 weeks - Received 3 infusions of Venofer 300 mg, last one on 01/06/2023 -Hematology workup from 03/02/2023 showed no evidence of nutritional deficiencies with normal iron levels, folate, B12 and copper.  LDH was slightly elevated at 200.  Protein and immunofixation electrophoresis did not reveal evidence of monoclonal protein with an unremarkable pattern.  No evidence of hemolysis.  2.  Social/family history: - Lives by himself.  He has about 10 hours of aide per day helps with day-to-day activities like cooking, taking shower as he had recurrent CVA.  He quit cigarette smoking more than 10 years ago. - Paternal great aunt had cancer.  Plan:  1.  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 -Labs from 07/07/2023 show a hemoglobin of 9.9 (11.2), the white blood cell count 3.9 (7.3) and a platelet count of 141 (218).  -Redraw all of iron levels on 05/23/2023 showed iron saturation 39%, ferritin 249 and vitamin B12 level 524.  MMA was 242. -Proceed with Retacrit 5000 units today.  2.  Pancytopenia: -We discussed that  not only did his hemoglobin dropped to 9.9, he is slightly neutropenic with white blood cell count of 3.9 and has thrombocytopenia with a platelet count of 141.  He has intermittent low levels of both cell lines in the past so at this point we will defer bone marrow biopsy but we did discuss in detail that if these further decline would recommend a bone marrow.  PLAN SUMMARY: >> Retacrit today.  >> RTC in every 2 weeks for labs and possible Retacrit based on results. >> Return to clinic in 16 weeks for labs, see MD +/- Retacrit.      No orders of the defined types were placed in this encounter.  I spent 25 minutes dedicated to the care of this patient (face-to-face and non-face-to-face) on the date of the encounter to include what is described in the assessment and plan.   Mauro Kaufmann, NP   12/6/20249:06 AM  CHIEF COMPLAINT/PURPOSE OF CONSULT:   Diagnosis: anemia  Current Therapy: Epogen and intermittent IV iron  HISTORY OF PRESENT ILLNESS:   [facility] is a 57 y.o. male presenting to clinic today for follow-up for anemia.  He was last seen in clinic by me on 03/16/2023 for follow-up and to review lab results.  Denies any interval hospitalizations, surgeries or changes to his baseline health.  Has chronic right knee pain and receives joint injections.  He is here today with his new aide.  Has occasional constipation and diarrhea.  Has intermittent headaches and dizziness when he stands.  He has trouble with his mobility due to his right knee.  Reports a nosebleed around Thanksgiving.  States it was easily stopped.  He continues to deny any bleeding per rectum, melena, hematochezia, hematemesis, epistaxis or gingival bleeding.  Reports feeling more tired over the past 3 to 4 days.  Appetite is 80% energy levels are 50%.  PAST MEDICAL HISTORY:   Past Medical History: Past Medical History:  Diagnosis Date   Bipolar 1 disorder (HCC)    depression/anxiety   Bone spur    left  heel   Cervical radiculopathy    Chronic back pain    Chronic chest wall pain    Chronic neck pain    Coronary artery disease    a. s/p CABG in 03/2016 with LIMA-LAD, SVG-D1, SVG-RCA, and Seq SVG-mid and distal OM   Diabetes mellitus    Type II   Hallucinations    "long history of them"   Headache(784.0)    History of gout    HTN (hypertension)    Insomnia    Neuropathy    Pain management    Polysubstance abuse (HCC)    Right leg pain    chronic   Schizophrenia (HCC)    Seizures (HCC)    last sz between July 5-9th, 2016; epilepsy   Sleep apnea    Stroke (HCC)    Transfusion of blood product refused for religious reason     Surgical History: Past Surgical History:  Procedure Laterality Date   ANTERIOR CERVICAL DECOMP/DISCECTOMY FUSION N/A 12/14/2015   Procedure: ANTERIOR CERVICAL DECOMPRESSION/DISCECTOMY FUSION CERVICAL FIVE -SIX;  Surgeon: Julio Sicks, MD;  Location: [facility];  Service: Neurosurgery;  Laterality: N/A;   BIOPSY  05/07/2015   Procedure: BIOPSY (Gastric);  Surgeon: Corbin Ade, MD;  Location: [facility];  Service: Endoscopy;;   CARDIAC CATHETERIZATION N/A 03/07/2016   Procedure: Left Heart Cath and Coronary Angiography;  Surgeon: Lyn Records, MD;  Location: [facility];  Service: Cardiovascular;  Laterality: N/A;   COLONOSCOPY WITH PROPOFOL N/A 05/07/2015   [id number] diverticulosis, multiple colon polyps removed, tubular adenoma, serrated colon polyp. Next colonoscopy October 2019   COLONOSCOPY WITH PROPOFOL N/A 08/02/2018   Procedure: COLONOSCOPY WITH PROPOFOL;  Surgeon: Corbin Ade, MD;  Location: [facility];  Service: Endoscopy;  Laterality: N/A;  12:00pm   CORONARY ARTERY BYPASS GRAFT N/A 03/28/2016   Procedure: CORONARY ARTERY BYPASS GRAFTING (CABG) x 5 USING GREATER SAPHENOUS VEIN;  Surgeon: Alleen Borne, MD;  Location: [facility] OR;  Service: Open Heart Surgery;  Laterality: N/A;   ENDOVEIN HARVEST OF GREATER SAPHENOUS VEIN Right 03/28/2016    Procedure: ENDOVEIN HARVEST OF GREATER SAPHENOUS VEIN;  Surgeon: Alleen Borne, MD;  Location: [facility] OR;  Service: Open Heart Surgery;  Laterality: Right;   ESOPHAGEAL DILATION N/A 05/07/2015   Procedure: ESOPHAGEAL DILATION WITH 56FR MALONEY DILATOR;  Surgeon: Corbin Ade, MD;  Location: [facility];  Service: Endoscopy;  Laterality: N/A;   ESOPHAGOGASTRODUODENOSCOPY (EGD) WITH PROPOFOL N/A 05/07/2015   RMR: Status post dilation of normal esophagus. Gastritis.   IR CT HEAD LTD  12/17/2020   IR CT HEAD LTD  12/17/2020   IR INTRA CRAN STENT  12/17/2020   IR PERCUTANEOUS ART THROMBECTOMY/INFUSION INTRACRANIAL INC DIAG ANGIO  12/17/2020   IR RADIOLOGIST EVAL & MGMT  03/25/2021   KNEE SURGERY Left    arthroscopy   MANDIBLE FRACTURE SURGERY     POLYPECTOMY  05/07/2015   Procedure: POLYPECTOMY (Hepatic Flexure, Distal Transverse Colon, Rectal);  Surgeon: Corbin Ade, MD;  Location: [facility];  Service: Endoscopy;;  RADIOLOGY WITH ANESTHESIA N/A 12/16/2020   Procedure: IR WITH ANESTHESIA;  Surgeon: Julieanne Cotton, MD;  Location: [facility];  Service: Radiology;  Laterality: N/A;   TEE WITHOUT CARDIOVERSION N/A 03/28/2016   Procedure: TRANSESOPHAGEAL ECHOCARDIOGRAM (TEE);  Surgeon: Alleen Borne, MD;  Location: [facility];  Service: Open Heart Surgery;  Laterality: N/A;   TONSILLECTOMY     TRIGGER FINGER RELEASE Left 10/15/2021   Procedure: RELEASE TRIGGER FINGER/A-1 PULLEY left middle or long finger;  Surgeon: Vickki Hearing, MD;  Location: [facility];  Service: Orthopedics;  Laterality: Left;    Social History: Social History   Socioeconomic History   Marital status: Single    Spouse name: Not on file   Number of children: Not on file   Years of education: 11th grade   Highest education level: Not on file  Occupational History   Occupation: unemployed    Employer: NOT EMPLOYED  Tobacco Use   Smoking status: Former    Current packs/day: 0.50    Average packs/day: 0.5 packs/day for 30.0 years (15.0 ttl  pk-yrs)    Types: Cigarettes   Smokeless tobacco: Never   Tobacco comments:    3 cigs per day  Vaping Use   Vaping status: Never Used  Substance and Sexual Activity   Alcohol use: Not Currently   Drug use: Not Currently    Types: Marijuana   Sexual activity: Not Currently  Other Topics Concern   Not on file  Social History Narrative   09/13/21 Lives alone, caregiver daily, manages meds   Drinks a cup of coffee a day   Social Determinants of Health   Financial Resource Strain: Not on file  Food Insecurity: No Food Insecurity (12/16/2022)   Hunger Vital Sign    Worried About Running Out of Food in the Last Year: Never true    Ran Out of Food in the Last Year: Never true  Transportation Needs: No Transportation Needs (12/16/2022)   PRAPARE - Administrator, Civil Service (Medical): No    Lack of Transportation (Non-Medical): No  Physical Activity: Not on file  Stress: Not on file  Social Connections: Not on file  Intimate Partner Violence: Not At Risk (12/16/2022)   Humiliation, Afraid, Rape, and Kick questionnaire    Fear of Current or Ex-Partner: No    Emotionally Abused: No    Physically Abused: No    Sexually Abused: No    Family History: Family History  Problem Relation Age of Onset   Diabetes Father    Hypertension Father    Sleep apnea Father    Stroke Sister    Arthritis Other    Diabetes Other    Asthma Other     Current Medications:  Current Outpatient Medications:    ACCU-CHEK GUIDE test strip, USE AS DIRECTED TO MONITOR BLOOD SUGAR (4) TIMES DAILY., Disp: 100 strip, Rfl: 6   allopurinol (ZYLOPRIM) 100 MG tablet, Take 1 tablet by mouth daily., Disp: , Rfl:    allopurinol (ZYLOPRIM) 100 MG tablet, TAKE 1 TABLET BY MOUTH DAILY., Disp: 30 tablet, Rfl: 0   allopurinol (ZYLOPRIM) 100 MG tablet, TAKE 1 TABLET BY MOUTH DAILY., Disp: 30 tablet, Rfl: 0   amLODipine (NORVASC) 10 MG tablet, Take 10 mg by mouth daily., Disp: , Rfl:    atorvastatin  (LIPITOR) 80 MG tablet, TAKE 1 TABLET BY MOUTH ONCE DAILY., Disp: 30 tablet, Rfl: 0   Cholecalciferol (VITAMIN D3) 50 MCG (2000 UT) capsule, Take 1 capsule (2,000  Units total) by mouth daily., Disp: 90 capsule, Rfl: 3   divalproex (DEPAKOTE) 250 MG DR tablet, TAKE (1) TABLET BY MOUTH TWICE DAILY., Disp: 60 tablet, Rfl: 3   escitalopram (LEXAPRO) 20 MG tablet, Take 1 tablet (20 mg total) by mouth daily., Disp: 30 tablet, Rfl: 0   ferrous sulfate 325 (65 FE) MG tablet, Take 325 mg by mouth daily., Disp: , Rfl:    insulin glargine (LANTUS SOLOSTAR) 100 UNIT/ML Solostar Pen, Inject into the skin., Disp: , Rfl:    insulin lispro (HUMALOG) 100 UNIT/ML injection, Inject into the skin., Disp: , Rfl:    isosorbide mononitrate (IMDUR) 30 MG 24 hr tablet, TAKE ONE TABLET BY MOUTH ONCE DAILY., Disp: 30 tablet, Rfl: 0   lactulose (CHRONULAC) 10 GM/15ML solution, Take 30 mLs (20 g total) by mouth 2 (two) times daily., Disp: 946 mL, Rfl: 2   lamoTRIgine (LAMICTAL) 25 MG tablet, TAKE 2 TABLETS BY MOUTH TWICE DAILY., Disp: 120 tablet, Rfl: 5   mirtazapine (REMERON) 15 MG tablet, Take 15 mg by mouth at bedtime., Disp: , Rfl:    Multiple Vitamin (MULTIVITAMIN WITH MINERALS) TABS tablet, Take 1 tablet by mouth daily., Disp: , Rfl:    risperiDONE (RISPERDAL) 2 MG tablet, Take by mouth., Disp: , Rfl:    ticagrelor (BRILINTA) 90 MG TABS tablet, Take 1 tablet (90 mg total) by mouth 2 (two) times daily., Disp: 60 tablet, Rfl: 5   UNABLE TO FIND, 1 each by Does not apply route daily. Med Name: GLOVES 1B/M OR PRN, Disp: 1 each, Rfl: 0   UNABLE TO FIND, 1 each by Does not apply route daily. Med Name: PULL UPS 100/M OR PRN, Disp: 1 each, Rfl: 0   UNABLE TO FIND, 1 each by Does not apply route daily. Med Name: NUTRITIONAL SUPPLEMENT 180U/M OR PRN, Disp: 1 each, Rfl: 0   UNABLE TO FIND, 1 each by Does not apply route daily. Med Name: UNDER PADS/CHUX 30/M OR PRN, Disp: 1 each, Rfl: 0   Vitamin D, Ergocalciferol, (DRISDOL) 1.25 MG  (50000 UNIT) CAPS capsule, Take 1 capsule (50,000 Units total) by mouth every 7 (seven) days., Disp: 5 capsule, Rfl: 0   Allergies: Allergies  Allergen Reactions   Aspirin Other (See Comments), Swelling and Itching    Facial swelling  Facial swelling , Reaction:  Facial swelling    REVIEW OF SYSTEMS:   Review of Systems  Constitutional:  Positive for fatigue.  Gastrointestinal:  Positive for constipation and diarrhea. Negative for blood in stool.  Musculoskeletal:  Positive for arthralgias (Right knee pain).  Neurological:  Positive for dizziness and headaches.     VITALS:   There were no vitals taken for this visit.  Wt Readings from Last 3 Encounters:  06/08/23 136 lb (61.7 kg)  06/01/23 136 lb 6.4 oz (61.9 kg)  03/02/23 138 lb 3.2 oz (62.7 kg)    There is no height or weight on file to calculate BMI.   PHYSICAL EXAM:   Physical Exam Constitutional:      Appearance: Normal appearance.  Cardiovascular:     Rate and Rhythm: Normal rate and regular rhythm.  Pulmonary:     Effort: Pulmonary effort is normal.     Breath sounds: Normal breath sounds.  Abdominal:     General: Bowel sounds are normal.     Palpations: Abdomen is soft.  Musculoskeletal:        General: No swelling. Normal range of motion.  Neurological:  Mental Status: He is alert and oriented to person, place, and time. Mental status is at baseline.     LABS:      Latest Ref Rng & Units 07/07/2023    8:21 AM 06/16/2023    8:02 AM 05/23/2023    9:07 AM  CBC  WBC 4.0 - 10.5 K/uL 3.9  7.3  4.3   Hemoglobin 13.0 - 17.0 g/dL 9.9  02.7  25.3   Hematocrit 39.0 - 52.0 % 31.2  32.9  31.8   Platelets 150 - 400 K/uL 141  218  179       Latest Ref Rng & Units 12/21/2022    9:35 AM 12/20/2022    8:18 AM 12/19/2022    5:53 AM  CMP  Glucose 70 - 99 mg/dL 664  95  65   BUN 6 - 20 mg/dL 7  8  7    Creatinine 0.61 - 1.24 mg/dL 4.03  4.74  2.59   Sodium 135 - 145 mmol/L 135  136  137   Potassium 3.5 -  5.1 mmol/L 3.9  4.2  4.3   Chloride 98 - 111 mmol/L 103  106  106   CO2 22 - 32 mmol/L 25  25  26    Calcium 8.9 - 10.3 mg/dL 8.4  8.6  8.7      No results found for: "CEA1", "CEA" / No results found for: "CEA1", "CEA" No results found for: "PSA1" No results found for: "DGL875" No results found for: "CAN125"  Lab Results  Component Value Date   TOTALPROTELP 6.0 03/02/2023   TOTALPROTELP 6.0 03/02/2023   ALBUMINELP 3.8 03/02/2023   A1GS 0.2 03/02/2023   A2GS 0.3 (L) 03/02/2023   BETS 0.7 03/02/2023   GAMS 0.9 03/02/2023   MSPIKE Not Observed 03/02/2023   SPEI Comment 03/02/2023   Lab Results  Component Value Date   TIBC 290 05/23/2023   TIBC 313 03/02/2023   TIBC 274 11/10/2022   FERRITIN 249 05/23/2023   FERRITIN 220 03/02/2023   FERRITIN 70 11/10/2022   IRONPCTSAT 39 05/23/2023   IRONPCTSAT 48 (H) 03/02/2023   IRONPCTSAT 25 11/10/2022   Lab Results  Component Value Date   LDH 200 (H) 03/02/2023     STUDIES:   DG Knee 3 Views Right  Result Date: 06/09/2023 X-ray report Chief complaint right knee pain and stiffness no injury Images 3 views attempted Zoila Shutter view unsuccessful patient could not maintain positioning Reading: Normal alignment physiologic valgus, symmetric joint space narrowing on each side of the joint without osteophytes or secondary bone formation Impression: Possible arthritis grade 1 or 2 no osteophytes seen symmetric joint space narrowing normal alignment no fracture

## 2023-07-07 NOTE — Patient Instructions (Signed)
[facility] - A DEPT OF [facility]  Discharge Instructions: Thank you for choosing [facility] to provide your oncology and hematology care.  If you have a lab appointment with the Cancer Center - please note that after April 8th, 2024, all labs will be drawn in the cancer center.  You do not have to check in or register with the main entrance as you have in the past but will complete your check-in in the cancer center.  Wear comfortable clothing and clothing appropriate for easy access to any Portacath or PICC line.   We strive to give you quality time with your provider. You may need to reschedule your appointment if you arrive late (15 or more minutes).  Arriving late affects you and other patients whose appointments are after yours.  Also, if you miss three or more appointments without notifying the office, you may be dismissed from the clinic at the provider's discretion.      For prescription refill requests, have your pharmacy contact our office and allow 72 hours for refills to be completed.    Today you received the following:  Retacrit.  Epoetin Alfa Injection What is this medication? EPOETIN ALFA (e POE e tin AL fa) treats low levels of red blood cells (anemia) caused by kidney disease, chemotherapy, or HIV medications. It can also be used in people who are at risk for blood loss during surgery. It works by Systems analyst make more red blood cells, which reduces the need for blood transfusions. This medicine may be used for other purposes; ask your health care provider or pharmacist if you have questions. COMMON BRAND NAME(S): Epogen, Procrit, Retacrit What should I tell my care team before I take this medication? They need to know if you have any of these conditions: Blood clots Cancer Heart disease High blood pressure On dialysis Seizures Stroke An unusual or allergic reaction to epoetin alfa, albumin, benzyl alcohol, other  medications, foods, dyes, or preservatives Pregnant or trying to get pregnant Breast-feeding How should I use this medication? This medication is injected into a vein or under the skin. It is usually given by your care team in a hospital or clinic setting. It may also be given at home. If you get this medication at home, you will be taught how to prepare and give it. Use exactly as directed. Take it as directed on the prescription label at the same time every day. Keep taking it unless your care team tells you to stop. It is important that you put your used needles and syringes in a special sharps container. Do not put them in a trash can. If you do not have a sharps container, call your pharmacist or care team to get one. A special MedGuide will be given to you by the pharmacist with each prescription and refill. Be sure to read this information carefully each time. Talk to your care team about the use of this medication in children. While this medication may be used in children as young as 1 month of age for selected conditions, precautions do apply. Overdosage: If you think you have taken too much of this medicine contact a poison control center or emergency room at once. NOTE: This medicine is only for you. Do not share this medicine with others. What if I miss a dose? If you miss a dose, take it as soon as you can. If it is almost time for your next  dose, take only that dose. Do not take double or extra doses. What may interact with this medication? Darbepoetin alfa Methoxy polyethylene glycol-epoetin beta This list may not describe all possible interactions. Give your health care provider a list of all the medicines, herbs, non-prescription drugs, or dietary supplements you use. Also tell them if you smoke, drink alcohol, or use illegal drugs. Some items may interact with your medicine. What should I watch for while using this medication? Visit your care team for regular checks on your  progress. Check your blood pressure as directed. Know what your blood pressure should be and when to contact your care team. Your condition will be monitored carefully while you are receiving this medication. You may need blood work while taking this medication. What side effects may I notice from receiving this medication? Side effects that you should report to your care team as soon as possible: Allergic reactions--skin rash, itching, hives, swelling of the face, lips, tongue, or throat Blood clot--pain, swelling, or warmth in the leg, shortness of breath, chest pain Heart attack--pain or tightness in the chest, shoulders, arms, or jaw, nausea, shortness of breath, cold or clammy skin, feeling faint or lightheaded Increase in blood pressure Rash, fever, and swollen lymph nodes Redness, blistering, peeling, or loosening of the skin, including inside the mouth Seizures Stroke--sudden numbness or weakness of the face, arm, or leg, trouble speaking, confusion, trouble walking, loss of balance or coordination, dizziness, severe headache, change in vision Side effects that usually do not require medical attention (report to your care team if they continue or are bothersome): Bone, joint, or muscle pain Cough Headache Nausea Pain, redness, or irritation at injection site This list may not describe all possible side effects. Call your doctor for medical advice about side effects. You may report side effects to FDA at 1-800-FDA-1088. Where should I keep my medication? Keep out of the reach of children and pets. Store in a refrigerator. Do not freeze. Do not shake. Protect from light. Keep this medication in the original container until you are ready to take it. See product for storage information. Get rid of any unused medication after the expiration date. To get rid of medications that are no longer needed or have expired: Take the medication to a medication take-back program. Check with your  pharmacy or law enforcement to find a location. If you cannot return the medication, ask your pharmacist or care team how to get rid of the medication safely. NOTE: This sheet is a summary. It may not cover all possible information. If you have questions about this medicine, talk to your doctor, pharmacist, or health care provider.  2024 Elsevier/Gold Standard (2021-11-19 00:00:00)     To help prevent nausea and vomiting after your treatment, we encourage you to take your nausea medication as directed.  BELOW ARE SYMPTOMS THAT SHOULD BE REPORTED IMMEDIATELY: *FEVER GREATER THAN 100.4 F (38 C) OR HIGHER *CHILLS OR SWEATING *NAUSEA AND VOMITING THAT IS NOT CONTROLLED WITH YOUR NAUSEA MEDICATION *UNUSUAL SHORTNESS OF BREATH *UNUSUAL BRUISING OR BLEEDING *URINARY PROBLEMS (pain or burning when urinating, or frequent urination) *BOWEL PROBLEMS (unusual diarrhea, constipation, pain near the anus) TENDERNESS IN MOUTH AND THROAT WITH OR WITHOUT PRESENCE OF ULCERS (sore throat, sores in mouth, or a toothache) UNUSUAL RASH, SWELLING OR PAIN  UNUSUAL VAGINAL DISCHARGE OR ITCHING   Items with * indicate a potential emergency and should be followed up as soon as possible or go to the Emergency Department if any problems should  occur.  Please show the CHEMOTHERAPY ALERT CARD or IMMUNOTHERAPY ALERT CARD at check-in to the Emergency Department and triage nurse.  Should you have questions after your visit or need to cancel or reschedule your appointment, please contact [facility] [place] - A DEPT OF [facility] [phone]  and follow the prompts.  Office hours are 8:00 a.m. to 4:30 p.m. Monday - Friday. Please note that voicemails left after 4:00 p.m. may not be returned until the following business day.  We are closed weekends and major holidays. You have access to a nurse at all times for urgent questions. Please call the main number to the clinic [phone] and follow the  prompts.  For any non-urgent questions, you may also contact your provider using MyChart. We now offer e-Visits for anyone 42 and older to request care online for non-urgent symptoms. For details visit [email or web address].   Also download the MyChart app! Go to the app store, search "MyChart", open the app, select [facility], and log in with your MyChart username and password.

## 2023-07-07 NOTE — Progress Notes (Signed)
Hemoglobin today is 9.9.  We will proceed with Retacrit injection per provider orders.

## 2023-07-21 ENCOUNTER — Inpatient Hospital Stay: Payer: Self-pay

## 2023-07-21 VITALS — BP 113/70 | HR 71 | Temp 97.2°F | Resp 18

## 2023-07-21 DIAGNOSIS — D638 Anemia in other chronic diseases classified elsewhere: Secondary | ICD-10-CM

## 2023-07-21 DIAGNOSIS — N1832 Chronic kidney disease, stage 3b: Secondary | ICD-10-CM | POA: Diagnosis not present

## 2023-07-21 DIAGNOSIS — D649 Anemia, unspecified: Secondary | ICD-10-CM

## 2023-07-21 LAB — CBC
HCT: 31.7 % — ABNORMAL LOW (ref 39.0–52.0)
Hemoglobin: 10.5 g/dL — ABNORMAL LOW (ref 13.0–17.0)
MCH: 31.5 pg (ref 26.0–34.0)
MCHC: 33.1 g/dL (ref 30.0–36.0)
MCV: 95.2 fL (ref 80.0–100.0)
Platelets: 180 10*3/uL (ref 150–400)
RBC: 3.33 MIL/uL — ABNORMAL LOW (ref 4.22–5.81)
RDW: 14.9 % (ref 11.5–15.5)
WBC: 4.4 10*3/uL (ref 4.0–10.5)
nRBC: 0 % (ref 0.0–0.2)

## 2023-07-21 LAB — GLUCOSE, CAPILLARY: Glucose-Capillary: 155 mg/dL — ABNORMAL HIGH (ref 70–99)

## 2023-07-21 MED ORDER — EPOETIN ALFA-EPBX 2000 UNIT/ML IJ SOLN
2000.0000 [IU] | Freq: Once | INTRAMUSCULAR | Status: AC
  Administered 2023-07-21: 2000 [IU] via SUBCUTANEOUS
  Filled 2023-07-21: qty 1

## 2023-07-21 MED ORDER — EPOETIN ALFA-EPBX 3000 UNIT/ML IJ SOLN
3000.0000 [IU] | Freq: Once | INTRAMUSCULAR | Status: AC
  Administered 2023-07-21: 3000 [IU] via SUBCUTANEOUS
  Filled 2023-07-21: qty 1

## 2023-07-21 MED ORDER — EPOETIN ALFA-EPBX 10000 UNIT/ML IJ SOLN: 5000.0000 [IU] | Freq: Once | INTRAMUSCULAR | Status: DC

## 2023-07-21 NOTE — Progress Notes (Unsigned)
Weber Cooks Pau presents today for Retacrit injection per the provider's orders.  Stable during administration without incident; injection site WNL; see MAR for injection details.  Patient tolerated procedure well and without incident.  No questions or complaints noted at this time.

## 2023-07-21 NOTE — Progress Notes (Signed)
Patient presents today for Retacrit injection per providers order.  Vital signs WNL.  Hgb 10.5.  Stable during administration without incident; injection site WNL; see MAR for injection details.  Patient tolerated procedure well and without incident.  No questions or complaints noted at this time.

## 2023-07-21 NOTE — Patient Instructions (Signed)
 [facility] [place] - A DEPT OF [facility]  Discharge Instructions: Thank you for choosing [facility] to provide your oncology and hematology care.  If you have a lab appointment with the Cancer Center - please note that after April 8th, 2024, all labs will be drawn in the cancer center.  You do not have to check in or register with the main entrance as you have in the past but will complete your check-in in the cancer center.  Wear comfortable clothing and clothing appropriate for easy access to any Portacath or PICC line.   We strive to give you quality time with your provider. You may need to reschedule your appointment if you arrive late (15 or more minutes).  Arriving late affects you and other patients whose appointments are after yours.  Also, if you miss three or more appointments without notifying the office, you may be dismissed from the clinic at the provider's discretion.      For prescription refill requests, have your pharmacy contact our office and allow 72 hours for refills to be completed.    Today you received the following chemotherapy and/or immunotherapy agents Retacrit      To help prevent nausea and vomiting after your treatment, we encourage you to take your nausea medication as directed.  BELOW ARE SYMPTOMS THAT SHOULD BE REPORTED IMMEDIATELY: *FEVER GREATER THAN 100.4 F (38 C) OR HIGHER *CHILLS OR SWEATING *NAUSEA AND VOMITING THAT IS NOT CONTROLLED WITH YOUR NAUSEA MEDICATION *UNUSUAL SHORTNESS OF BREATH *UNUSUAL BRUISING OR BLEEDING *URINARY PROBLEMS (pain or burning when urinating, or frequent urination) *BOWEL PROBLEMS (unusual diarrhea, constipation, pain near the anus) TENDERNESS IN MOUTH AND THROAT WITH OR WITHOUT PRESENCE OF ULCERS (sore throat, sores in mouth, or a toothache) UNUSUAL RASH, SWELLING OR PAIN  UNUSUAL VAGINAL DISCHARGE OR ITCHING   Items with * indicate a potential emergency and should be followed up  as soon as possible or go to the Emergency Department if any problems should occur.  Please show the CHEMOTHERAPY ALERT CARD or IMMUNOTHERAPY ALERT CARD at check-in to the Emergency Department and triage nurse.  Should you have questions after your visit or need to cancel or reschedule your appointment, please contact [facility] [place] - A DEPT OF [facility] [phone]  and follow the prompts.  Office hours are 8:00 a.m. to 4:30 p.m. Monday - Friday. Please note that voicemails left after 4:00 p.m. may not be returned until the following business day.  We are closed weekends and major holidays. You have access to a nurse at all times for urgent questions. Please call the main number to the clinic [phone] and follow the prompts.  For any non-urgent questions, you may also contact your provider using MyChart. We now offer e-Visits for anyone 31 and older to request care online for non-urgent symptoms. For details visit [email or web address].   Also download the MyChart app! Go to the app store, search "MyChart", open the app, select [place], and log in with your MyChart username and password.

## 2023-07-29 NOTE — Progress Notes (Deleted)
Cardiology Office Note    Date:  07/29/2023   ID:  Melvin Taylor, DOB 06-19-1966, MRN [id number]  PCP:  [name], MD  Cardiologist: Eden Emms   History of Present Illness:    Melvin Taylor is a 57 y.o. male  new to me previously seen by Dr Nat Math of CAD (s/p CABG in 03/2016 with LIMA-LAD, SVG-D1, SVG-RCA, and Seq SVG-mid and distal OM), HTN, HLD, IDDM, OSA, Bipolar Disorder, Schizophrenia and tobacco use   TTE 12/17/20 EF 65-70%   Present to [facility] ED on 09/14/2020 for evaluation of recurrent chest pain. Troponin values were negative and EKG was without acute changes. He was started on Imdur 30 mg daily and discharged home. His stress test was performed on 09/21/2020 and showed normal resting and stress perfusion with no evidence of ischemia and was overall a low risk study.  Thinks Ranexa has caused some nausea Some palpitations brief skips with high caffeine intake both coffee and soda Activity limited by left knee pain   Admitted with stroke 12/21/20-12/30/20 right sided weakness and speech difficultyMRI with scattered infarcts in left MCA/ affecting the frontal, parietal and occipital lobe Had mechanical thrombectomy EEG negative for seizures previously on lamicatal and depakote DM poorly controlled A1c 9.7 Had marijuana in drug screen ? Allergy to ASA and d/c with Brilinta D/c to rehab    He has a new aide with him today She is with him 9-4 7 days / week. He has two sisters near by No angina Some residual slow speech and right arm weakness from stroke Use to work for a tree [employer]   No chest pain Like to watch the [employer] play football Difficulty walking with a cane has had steroid injection by ortho in right knee   Sees [facility] for pancytopenia/anemia has had procrit injections Hct has been stable 30.2 He is a Jehovah's witness   ***   Past Medical History:  Diagnosis Date   Bipolar 1 disorder (HCC)    depression/anxiety   Bone spur    left  heel   Cervical radiculopathy    Chronic back pain    Chronic chest wall pain    Chronic neck pain    Coronary artery disease    a. s/p CABG in 03/2016 with LIMA-LAD, SVG-D1, SVG-RCA, and Seq SVG-mid and distal OM   Diabetes mellitus    Type II   Hallucinations    "long history of them"   Headache(784.0)    History of gout    HTN (hypertension)    Insomnia    Neuropathy    Pain management    Polysubstance abuse (HCC)    Right leg pain    chronic   Schizophrenia (HCC)    Seizures (HCC)    last sz between July 5-9th, 2016; epilepsy   Sleep apnea    Stroke (HCC)    Transfusion of blood product refused for religious reason     Past Surgical History:  Procedure Laterality Date   ANTERIOR CERVICAL DECOMP/DISCECTOMY FUSION N/A 12/14/2015   Procedure: ANTERIOR CERVICAL DECOMPRESSION/DISCECTOMY FUSION CERVICAL FIVE -SIX;  Surgeon: Julio Sicks, MD;  Location: [facility];  Service: Neurosurgery;  Laterality: N/A;   BIOPSY  05/07/2015   Procedure: BIOPSY (Gastric);  Surgeon: Corbin Ade, MD;  Location: [facility]S;  Service: Endoscopy;;   CARDIAC CATHETERIZATION N/A 03/07/2016   Procedure: Left Heart Cath and Coronary Angiography;  Surgeon: Lyn Records, MD;  Location: [facility]  INVASIVE CV LAB;  Service: Cardiovascular;  Laterality: N/A;   COLONOSCOPY WITH PROPOFOL N/A 05/07/2015   RUE:[id number] diverticulosis, multiple colon polyps removed, tubular adenoma, serrated colon polyp. Next colonoscopy October 2019   COLONOSCOPY WITH PROPOFOL N/A 08/02/2018   Procedure: COLONOSCOPY WITH PROPOFOL;  Surgeon: Corbin Ade, MD;  Location: [facility];  Service: Endoscopy;  Laterality: N/A;  12:00pm   CORONARY ARTERY BYPASS GRAFT N/A 03/28/2016   Procedure: CORONARY ARTERY BYPASS GRAFTING (CABG) x 5 USING GREATER SAPHENOUS VEIN;  Surgeon: Alleen Borne, MD;  Location: [facility] OR;  Service: Open Heart Surgery;  Laterality: N/A;   ENDOVEIN HARVEST OF GREATER SAPHENOUS VEIN Right 03/28/2016   Procedure: ENDOVEIN  HARVEST OF GREATER SAPHENOUS VEIN;  Surgeon: Alleen Borne, MD;  Location: [facility] OR;  Service: Open Heart Surgery;  Laterality: Right;   ESOPHAGEAL DILATION N/A 05/07/2015   Procedure: ESOPHAGEAL DILATION WITH 56FR MALONEY DILATOR;  Surgeon: Corbin Ade, MD;  Location: [facility]S;  Service: Endoscopy;  Laterality: N/A;   ESOPHAGOGASTRODUODENOSCOPY (EGD) WITH PROPOFOL N/A 05/07/2015   RMR: Status post dilation of normal esophagus. Gastritis.   IR CT HEAD LTD  12/17/2020   IR CT HEAD LTD  12/17/2020   IR INTRA CRAN STENT  12/17/2020   IR PERCUTANEOUS ART THROMBECTOMY/INFUSION INTRACRANIAL INC DIAG ANGIO  12/17/2020   IR RADIOLOGIST EVAL & MGMT  03/25/2021   KNEE SURGERY Left    arthroscopy   MANDIBLE FRACTURE SURGERY     POLYPECTOMY  05/07/2015   Procedure: POLYPECTOMY (Hepatic Flexure, Distal Transverse Colon, Rectal);  Surgeon: Corbin Ade, MD;  Location: [facility];  Service: Endoscopy;;   RADIOLOGY WITH ANESTHESIA N/A 12/16/2020   Procedure: IR WITH ANESTHESIA;  Surgeon: Julieanne Cotton, MD;  Location: [facility] OR;  Service: Radiology;  Laterality: N/A;   TEE WITHOUT CARDIOVERSION N/A 03/28/2016   Procedure: TRANSESOPHAGEAL ECHOCARDIOGRAM (TEE);  Surgeon: Alleen Borne, MD;  Location: [facility] OR;  Service: Open Heart Surgery;  Laterality: N/A;   TONSILLECTOMY     TRIGGER FINGER RELEASE Left 10/15/2021   Procedure: RELEASE TRIGGER FINGER/A-1 PULLEY left middle or long finger;  Surgeon: Vickki Hearing, MD;  Location: [facility]S;  Service: Orthopedics;  Laterality: Left;    Current Medications: Outpatient Medications Prior to Visit  Medication Sig Dispense Refill   ACCU-CHEK GUIDE test strip USE AS DIRECTED TO MONITOR BLOOD SUGAR (4) TIMES DAILY. 100 strip 6   allopurinol (ZYLOPRIM) 100 MG tablet Take 1 tablet by mouth daily.     allopurinol (ZYLOPRIM) 100 MG tablet TAKE 1 TABLET BY MOUTH DAILY. 30 tablet 0   allopurinol (ZYLOPRIM) 100 MG tablet TAKE ONE TABLET BY MOUTH EVERY DAY 30 tablet 0   amLODipine  (NORVASC) 10 MG tablet Take 10 mg by mouth daily.     atorvastatin (LIPITOR) 80 MG tablet TAKE 1 TABLET BY MOUTH ONCE DAILY. 30 tablet 0   Cholecalciferol (VITAMIN D3) 50 MCG (2000 UT) capsule Take 1 capsule (2,000 Units total) by mouth daily. 90 capsule 3   divalproex (DEPAKOTE) 250 MG DR tablet TAKE (1) TABLET BY MOUTH TWICE DAILY. 60 tablet 3   escitalopram (LEXAPRO) 20 MG tablet Take 1 tablet (20 mg total) by mouth daily. 30 tablet 0   ferrous sulfate 325 (65 FE) MG tablet Take 325 mg by mouth daily.     insulin glargine (LANTUS SOLOSTAR) 100 UNIT/ML Solostar Pen Inject into the skin.     insulin lispro (HUMALOG) 100 UNIT/ML injection Inject into the skin.  isosorbide mononitrate (IMDUR) 30 MG 24 hr tablet TAKE ONE TABLET BY MOUTH EVERY DAY 30 tablet 0   lactulose (CHRONULAC) 10 GM/15ML solution Take 30 mLs (20 g total) by mouth 2 (two) times daily. 946 mL 2   lamoTRIgine (LAMICTAL) 25 MG tablet TAKE 2 TABLETS BY MOUTH TWICE DAILY. 120 tablet 5   mirtazapine (REMERON) 15 MG tablet Take 15 mg by mouth at bedtime.     Multiple Vitamin (MULTIVITAMIN WITH MINERALS) TABS tablet Take 1 tablet by mouth daily.     risperiDONE (RISPERDAL) 2 MG tablet Take by mouth.     ticagrelor (BRILINTA) 90 MG TABS tablet Take 1 tablet (90 mg total) by mouth 2 (two) times daily. 60 tablet 5   UNABLE TO FIND 1 each by Does not apply route daily. Med Name: GLOVES 1B/M OR PRN 1 each 0   UNABLE TO FIND 1 each by Does not apply route daily. Med Name: PULL UPS 100/M OR PRN 1 each 0   UNABLE TO FIND 1 each by Does not apply route daily. Med Name: NUTRITIONAL SUPPLEMENT 180U/M OR PRN 1 each 0   UNABLE TO FIND 1 each by Does not apply route daily. Med Name: UNDER PADS/CHUX 30/M OR PRN 1 each 0   Vitamin D, Ergocalciferol, (DRISDOL) 1.25 MG (50000 UNIT) CAPS capsule Take 1 capsule (50,000 Units total) by mouth every 7 (seven) days. 5 capsule 0   No facility-administered medications prior to visit.     Allergies:    Aspirin   Social History   Socioeconomic History   Marital status: Single    Spouse name: Not on file   Number of children: Not on file   Years of education: 11th grade   Highest education level: Not on file  Occupational History   Occupation: unemployed    Employer: NOT EMPLOYED  Tobacco Use   Smoking status: Former    Current packs/day: 0.50    Average packs/day: 0.5 packs/day for 30.0 years (15.0 ttl pk-yrs)    Types: Cigarettes   Smokeless tobacco: Never   Tobacco comments:    3 cigs per day  Vaping Use   Vaping status: Never Used  Substance and Sexual Activity   Alcohol use: Not Currently   Drug use: Not Currently    Types: Marijuana   Sexual activity: Not Currently  Other Topics Concern   Not on file  Social History Narrative   09/13/21 Lives alone, caregiver daily, manages meds   Drinks a cup of coffee a day   Social Drivers of Corporate investment banker Strain: Not on file  Food Insecurity: No Food Insecurity (12/16/2022)   Hunger Vital Sign    Worried About Running Out of Food in the Last Year: Never true    Ran Out of Food in the Last Year: Never true  Transportation Needs: No Transportation Needs (12/16/2022)   PRAPARE - Administrator, Civil Service (Medical): No    Lack of Transportation (Non-Medical): No  Physical Activity: Not on file  Stress: Not on file  Social Connections: Not on file     Family History:  The patient's family history includes Arthritis in an other family member; Asthma in an other family member; Diabetes in his father and another family member; Hypertension in his father; Stroke in his sister.   Review of Systems:   Please see the history of present illness.     General:  No chills, fever, night sweats or weight changes.  Positive for left knee pain.  Cardiovascular:  No chest pain, dyspnea on exertion, edema, orthopnea, palpitations, paroxysmal nocturnal dyspnea. Dermatological: No rash, lesions/masses Respiratory:  No cough, dyspnea Urologic: No hematuria, dysuria Abdominal:   No nausea, vomiting, diarrhea, bright red blood per rectum, melena, or hematemesis Neurologic:  No visual changes, wkns, changes in mental status. All other systems reviewed and are otherwise negative except as noted above.   Physical Exam:    VS:  There were no vitals taken for this visit.   Affect appropriate Healthy:  appears stated age HEENT: normal Neck supple with no adenopathy JVP normal no bruits no thyromegaly Lungs clear with no wheezing and good diaphragmatic motion Heart:  S1/S2 no murmur, no rub, gallop or click PMI normal post CABG  Abdomen: benighn, BS positve, no tenderness, no AAA no bruit.  No HSM or HJR Distal pulses intact with no bruits No edema Neuro  left sided weakness more LLE from stroke  Skin warm and dry No muscular weakness   Wt Readings from Last 3 Encounters:  06/08/23 136 lb (61.7 kg)  06/01/23 136 lb 6.4 oz (61.9 kg)  03/02/23 138 lb 3.2 oz (62.7 kg)     Studies/Labs Reviewed:   EKG:  EKG is not ordered today.    Recent Labs: 12/16/2022: ALT 13 12/21/2022: BUN 7; Creatinine, Ser 1.13; Potassium 3.9; Sodium 135 03/02/2023: TSH 2.703 07/21/2023: Hemoglobin 10.5; Platelets 180   Lipid Panel    Component Value Date/Time   CHOL 102 08/28/2022 0329   CHOL 94 (L) 08/05/2022 0904   TRIG 71 08/28/2022 0329   HDL 42 08/28/2022 0329   HDL 50 08/05/2022 0904   CHOLHDL 2.4 08/28/2022 0329   VLDL 14 08/28/2022 0329   LDLCALC 46 08/28/2022 0329   LDLCALC 29 08/05/2022 0904    Additional studies/ records that were reviewed today include:   Echocardiogram: 03/2019 IMPRESSIONS     1. The left ventricle has normal systolic function with an ejection  fraction of 60-65%. The cavity size was normal. Mild to moderate  concentric LVH. Left ventricular diastolic Doppler parameters are  indeterminate. No evidence of left ventricular  regional wall motion abnormalities.   2. Right  atrial size was mildly dilated.   3. The mitral valve is grossly normal. Mild thickening of the mitral  valve leaflet.   4. The tricuspid valve is grossly normal.   5. The aortic valve is tricuspid.   6. The aorta is normal in size and structure.   NST: 09/2020 The study is normal. This is a low risk study. The left ventricular ejection fraction is normal (55-65%).   Normal resting and stress perfusion. No ischemia or infarction EF 56%     Assessment:    No diagnosis found.    Plan:   In order of problems listed above:  1. CAD - He is s/p CABG in 03/2016 with LIMA-LAD, SVG-D1, SVG-RCA, and Seq SVG-mid and distal OM. Myovue 12/21/20 showed normal resting and stress perfusion with no evidence of ischemia and was overall a low risk study. - He denies any recurrent chest pain Ranexa dose cut back . Will continue Plavix 75mg  daily (allergic to ASA), Amlodipine 10mg  daily, Atorvastatin 40mg  daily and Imdur 30mg  daily.   2. HTN - BP is well-controlled at 128/82 during today's visit. Continue Amlodipine, Lisinopril and Imdur at current dosing.   3. HLD - LDL was at 46 08/28/22 on statin f/u with primary   4. Palpitations - He reports occasional "  skips" which sound most consistent with PAC's or PVC's. Cut back caffeine no need for monitor   5. Left Knee Pain - He reports symptoms consistent with OA. He is unsure of any prior imaging and has never had a steroid injection to the site. F/U ortho   6. Stroke:  post mechanical thrombectomy on Briilinta  f/u primary consider more PT/OT s/p M1 Stenting Dr Corliss Skains on [place] Residual right sided weakness   7. DM:  poor control A1c 9.7 f/u primary importance of better control with recnet CVA and CAD discussed   8. Seizures:  EEG negative in hospital continue lamictal and depakote   9. Anemia:  getting Procrit injections Hct 31.7 stable 07/21/23  Jehovah's Witness sees [facility] Last visit pancytopenia and bone marrow biopsy  deferred PLT 141 WBC 3.9   10. CRF:  Cr 1.13 12/21/22 improved  f/u nephrology    F/U in a year    Signed, Charlton Haws, MD  07/29/2023 1:09 PM    [facility] [address] [place], [place] [address] Phone: [phone] Fax: [phone]

## 2023-08-04 ENCOUNTER — Inpatient Hospital Stay: Payer: Medicaid Other | Attending: Hematology

## 2023-08-04 ENCOUNTER — Inpatient Hospital Stay: Payer: Medicaid Other

## 2023-08-04 ENCOUNTER — Ambulatory Visit: Payer: Medicaid Other | Attending: Cardiovascular Disease | Admitting: Cardiovascular Disease

## 2023-08-04 VITALS — BP 105/77 | HR 62 | Temp 98.1°F | Resp 18

## 2023-08-04 DIAGNOSIS — N1832 Chronic kidney disease, stage 3b: Secondary | ICD-10-CM | POA: Insufficient documentation

## 2023-08-04 DIAGNOSIS — D649 Anemia, unspecified: Secondary | ICD-10-CM

## 2023-08-04 DIAGNOSIS — D631 Anemia in chronic kidney disease: Secondary | ICD-10-CM | POA: Insufficient documentation

## 2023-08-04 DIAGNOSIS — D638 Anemia in other chronic diseases classified elsewhere: Secondary | ICD-10-CM

## 2023-08-04 LAB — CBC
HCT: 30.8 % — ABNORMAL LOW (ref 39.0–52.0)
Hemoglobin: 10.3 g/dL — ABNORMAL LOW (ref 13.0–17.0)
MCH: 31.5 pg (ref 26.0–34.0)
MCHC: 33.4 g/dL (ref 30.0–36.0)
MCV: 94.2 fL (ref 80.0–100.0)
Platelets: 157 10*3/uL (ref 150–400)
RBC: 3.27 MIL/uL — ABNORMAL LOW (ref 4.22–5.81)
RDW: 14.2 % (ref 11.5–15.5)
WBC: 4.6 10*3/uL (ref 4.0–10.5)
nRBC: 0 % (ref 0.0–0.2)

## 2023-08-04 MED ORDER — EPOETIN ALFA-EPBX 10000 UNIT/ML IJ SOLN
5000.0000 [IU] | Freq: Once | INTRAMUSCULAR | Status: AC
  Administered 2023-08-04: 5000 [IU] via SUBCUTANEOUS
  Filled 2023-08-04: qty 1

## 2023-08-04 NOTE — Patient Instructions (Signed)
 [facility] [place] - A DEPT OF [facility]  Discharge Instructions: Thank you for choosing [facility] to provide your oncology and hematology care.  If you have a lab appointment with the Cancer Center - please note that after April 8th, 2024, all labs will be drawn in the cancer center.  You do not have to check in or register with the main entrance as you have in the past but will complete your check-in in the cancer center.  Wear comfortable clothing and clothing appropriate for easy access to any Portacath or PICC line.   We strive to give you quality time with your provider. You may need to reschedule your appointment if you arrive late (15 or more minutes).  Arriving late affects you and other patients whose appointments are after yours.  Also, if you miss three or more appointments without notifying the office, you may be dismissed from the clinic at the provider's discretion.      For prescription refill requests, have your pharmacy contact our office and allow 72 hours for refills to be completed.    Today you received the following :  Retacrit.  Epoetin Alfa Injection What is this medication? EPOETIN ALFA (e POE e tin AL fa) treats low levels of red blood cells (anemia) caused by kidney disease, chemotherapy, or HIV medications. It can also be used in people who are at risk for blood loss during surgery. It works by Systems analyst make more red blood cells, which reduces the need for blood transfusions. This medicine may be used for other purposes; ask your health care provider or pharmacist if you have questions. COMMON BRAND NAME(S): Epogen, Procrit, Retacrit What should I tell my care team before I take this medication? They need to know if you have any of these conditions: Blood clots Cancer Heart disease High blood pressure On dialysis Seizures Stroke An unusual or allergic reaction to epoetin alfa, albumin, benzyl alcohol, other  medications, foods, dyes, or preservatives Pregnant or trying to get pregnant Breast-feeding How should I use this medication? This medication is injected into a vein or under the skin. It is usually given by your care team in a hospital or clinic setting. It may also be given at home. If you get this medication at home, you will be taught how to prepare and give it. Use exactly as directed. Take it as directed on the prescription label at the same time every day. Keep taking it unless your care team tells you to stop. It is important that you put your used needles and syringes in a special sharps container. Do not put them in a trash can. If you do not have a sharps container, call your pharmacist or care team to get one. A special MedGuide will be given to you by the pharmacist with each prescription and refill. Be sure to read this information carefully each time. Talk to your care team about the use of this medication in children. While this medication may be used in children as young as 1 month of age for selected conditions, precautions do apply. Overdosage: If you think you have taken too much of this medicine contact a poison control center or emergency room at once. NOTE: This medicine is only for you. Do not share this medicine with others. What if I miss a dose? If you miss a dose, take it as soon as you can. If it is almost time for your  next dose, take only that dose. Do not take double or extra doses. What may interact with this medication? Darbepoetin alfa Methoxy polyethylene glycol-epoetin beta This list may not describe all possible interactions. Give your health care provider a list of all the medicines, herbs, non-prescription drugs, or dietary supplements you use. Also tell them if you smoke, drink alcohol, or use illegal drugs. Some items may interact with your medicine. What should I watch for while using this medication? Visit your care team for regular checks on your  progress. Check your blood pressure as directed. Know what your blood pressure should be and when to contact your care team. Your condition will be monitored carefully while you are receiving this medication. You may need blood work while taking this medication. What side effects may I notice from receiving this medication? Side effects that you should report to your care team as soon as possible: Allergic reactions--skin rash, itching, hives, swelling of the face, lips, tongue, or throat Blood clot--pain, swelling, or warmth in the leg, shortness of breath, chest pain Heart attack--pain or tightness in the chest, shoulders, arms, or jaw, nausea, shortness of breath, cold or clammy skin, feeling faint or lightheaded Increase in blood pressure Rash, fever, and swollen lymph nodes Redness, blistering, peeling, or loosening of the skin, including inside the mouth Seizures Stroke--sudden numbness or weakness of the face, arm, or leg, trouble speaking, confusion, trouble walking, loss of balance or coordination, dizziness, severe headache, change in vision Side effects that usually do not require medical attention (report to your care team if they continue or are bothersome): Bone, joint, or muscle pain Cough Headache Nausea Pain, redness, or irritation at injection site This list may not describe all possible side effects. Call your doctor for medical advice about side effects. You may report side effects to FDA at 1-800-FDA-1088. Where should I keep my medication? Keep out of the reach of children and pets. Store in a refrigerator. Do not freeze. Do not shake. Protect from light. Keep this medication in the original container until you are ready to take it. See product for storage information. Get rid of any unused medication after the expiration date. To get rid of medications that are no longer needed or have expired: Take the medication to a medication take-back program. Check with your  pharmacy or law enforcement to find a location. If you cannot return the medication, ask your pharmacist or care team how to get rid of the medication safely. NOTE: This sheet is a summary. It may not cover all possible information. If you have questions about this medicine, talk to your doctor, pharmacist, or health care provider.  2024 Elsevier/Gold Standard (2021-11-19 00:00:00)    To help prevent nausea and vomiting after your treatment, we encourage you to take your nausea medication as directed.  BELOW ARE SYMPTOMS THAT SHOULD BE REPORTED IMMEDIATELY: *FEVER GREATER THAN 100.4 F (38 C) OR HIGHER *CHILLS OR SWEATING *NAUSEA AND VOMITING THAT IS NOT CONTROLLED WITH YOUR NAUSEA MEDICATION *UNUSUAL SHORTNESS OF BREATH *UNUSUAL BRUISING OR BLEEDING *URINARY PROBLEMS (pain or burning when urinating, or frequent urination) *BOWEL PROBLEMS (unusual diarrhea, constipation, pain near the anus) TENDERNESS IN MOUTH AND THROAT WITH OR WITHOUT PRESENCE OF ULCERS (sore throat, sores in mouth, or a toothache) UNUSUAL RASH, SWELLING OR PAIN  UNUSUAL VAGINAL DISCHARGE OR ITCHING   Items with * indicate a potential emergency and should be followed up as soon as possible or go to the Emergency Department if any problems should  occur.  Please show the CHEMOTHERAPY ALERT CARD or IMMUNOTHERAPY ALERT CARD at check-in to the Emergency Department and triage nurse.  Should you have questions after your visit or need to cancel or reschedule your appointment, please contact [facility] [place] - A DEPT OF [facility] [phone]  and follow the prompts.  Office hours are 8:00 a.m. to 4:30 p.m. Monday - Friday. Please note that voicemails left after 4:00 p.m. may not be returned until the following business day.  We are closed weekends and major holidays. You have access to a nurse at all times for urgent questions. Please call the main number to the clinic [phone] and follow the  prompts.  For any non-urgent questions, you may also contact your provider using MyChart. We now offer e-Visits for anyone 73 and older to request care online for non-urgent symptoms. For details visit [email or web address].   Also download the MyChart app! Go to the app store, search "MyChart", open the app, select [place], and log in with your MyChart username and password.

## 2023-08-04 NOTE — Progress Notes (Signed)
 Hemoglobin today is 10.3. We will proceed with Retacrit  injection per provider orders.  Patient tolerated injection with no complaints voiced.  Site clean and dry with no bruising or swelling noted.  No complaints of pain.  Discharged with vital signs stable and no signs or symptoms of distress noted.

## 2023-08-07 ENCOUNTER — Other Ambulatory Visit: Payer: Self-pay | Admitting: Internal Medicine

## 2023-08-10 ENCOUNTER — Encounter: Payer: Self-pay | Admitting: *Deleted

## 2023-08-17 ENCOUNTER — Ambulatory Visit: Payer: Medicaid Other | Admitting: Podiatry

## 2023-08-18 ENCOUNTER — Inpatient Hospital Stay: Payer: Medicaid Other

## 2023-08-18 ENCOUNTER — Encounter: Payer: Self-pay | Admitting: Hematology

## 2023-08-18 VITALS — BP 105/75 | HR 73 | Temp 97.0°F | Resp 18

## 2023-08-18 DIAGNOSIS — D638 Anemia in other chronic diseases classified elsewhere: Secondary | ICD-10-CM

## 2023-08-18 DIAGNOSIS — D649 Anemia, unspecified: Secondary | ICD-10-CM

## 2023-08-18 DIAGNOSIS — N1832 Chronic kidney disease, stage 3b: Secondary | ICD-10-CM

## 2023-08-18 LAB — CBC
HCT: 31.6 % — ABNORMAL LOW (ref 39.0–52.0)
Hemoglobin: 10.4 g/dL — ABNORMAL LOW (ref 13.0–17.0)
MCH: 30.7 pg (ref 26.0–34.0)
MCHC: 32.9 g/dL (ref 30.0–36.0)
MCV: 93.2 fL (ref 80.0–100.0)
Platelets: 176 10*3/uL (ref 150–400)
RBC: 3.39 MIL/uL — ABNORMAL LOW (ref 4.22–5.81)
RDW: 14.1 % (ref 11.5–15.5)
WBC: 4.7 10*3/uL (ref 4.0–10.5)
nRBC: 0 % (ref 0.0–0.2)

## 2023-08-18 MED ORDER — EPOETIN ALFA-EPBX 10000 UNIT/ML IJ SOLN: 5000.0000 [IU] | Freq: Once | INTRAMUSCULAR | Status: DC

## 2023-08-18 MED ORDER — EPOETIN ALFA-EPBX 3000 UNIT/ML IJ SOLN
5000.0000 [IU] | Freq: Once | INTRAMUSCULAR | Status: AC
  Administered 2023-08-18: 5000 [IU] via SUBCUTANEOUS
  Filled 2023-08-18: qty 2

## 2023-08-18 NOTE — Progress Notes (Signed)
Patient's hgb 10.4 and blood pressure stable. Pt tolerated Retacrit injection with no complaints voiced.  Site clean and dry with no bruising or swelling noted at site.  See MAR for details.  Band aid applied.  Patient stable during and after injection.  Vss with discharge and left in satisfactory condition with no s/s of distress noted. All follow ups as scheduled.   Melvin Taylor [employer]

## 2023-08-18 NOTE — Patient Instructions (Signed)

## 2023-08-23 ENCOUNTER — Encounter (HOSPITAL_COMMUNITY): Payer: Self-pay | Admitting: Emergency Medicine

## 2023-08-23 ENCOUNTER — Ambulatory Visit: Payer: Medicaid Other | Admitting: Podiatry

## 2023-08-23 ENCOUNTER — Other Ambulatory Visit: Payer: Self-pay

## 2023-08-23 ENCOUNTER — Inpatient Hospital Stay (HOSPITAL_COMMUNITY)
Admission: EM | Admit: 2023-08-23 | Discharge: 2023-08-26 | DRG: 177 | Disposition: A | Discharge status: Home / Self Care | Payer: Medicaid Other | Attending: Family Medicine | Admitting: Family Medicine

## 2023-08-23 ENCOUNTER — Encounter: Payer: Self-pay | Admitting: Podiatry

## 2023-08-23 ENCOUNTER — Emergency Department (HOSPITAL_COMMUNITY): Payer: Medicaid Other

## 2023-08-23 VITALS — Ht 67.0 in

## 2023-08-23 DIAGNOSIS — M109 Gout, unspecified: Secondary | ICD-10-CM | POA: Diagnosis present

## 2023-08-23 DIAGNOSIS — Z833 Family history of diabetes mellitus: Secondary | ICD-10-CM

## 2023-08-23 DIAGNOSIS — Z8249 Family history of ischemic heart disease and other diseases of the circulatory system: Secondary | ICD-10-CM

## 2023-08-23 DIAGNOSIS — Z981 Arthrodesis status: Secondary | ICD-10-CM

## 2023-08-23 DIAGNOSIS — G9341 Metabolic encephalopathy: Secondary | ICD-10-CM | POA: Diagnosis not present

## 2023-08-23 DIAGNOSIS — Z7902 Long term (current) use of antithrombotics/antiplatelets: Secondary | ICD-10-CM

## 2023-08-23 DIAGNOSIS — N179 Acute kidney failure, unspecified: Secondary | ICD-10-CM | POA: Diagnosis present

## 2023-08-23 DIAGNOSIS — Z860101 Personal history of adenomatous and serrated colon polyps: Secondary | ICD-10-CM

## 2023-08-23 DIAGNOSIS — B351 Tinea unguium: Secondary | ICD-10-CM | POA: Diagnosis not present

## 2023-08-23 DIAGNOSIS — U071 COVID-19: Secondary | ICD-10-CM | POA: Diagnosis not present

## 2023-08-23 DIAGNOSIS — G4730 Sleep apnea, unspecified: Secondary | ICD-10-CM | POA: Diagnosis present

## 2023-08-23 DIAGNOSIS — I10 Essential (primary) hypertension: Secondary | ICD-10-CM | POA: Diagnosis present

## 2023-08-23 DIAGNOSIS — Z951 Presence of aortocoronary bypass graft: Secondary | ICD-10-CM

## 2023-08-23 DIAGNOSIS — E871 Hypo-osmolality and hyponatremia: Secondary | ICD-10-CM | POA: Diagnosis present

## 2023-08-23 DIAGNOSIS — Z79899 Other long term (current) drug therapy: Secondary | ICD-10-CM

## 2023-08-23 DIAGNOSIS — I2510 Atherosclerotic heart disease of native coronary artery without angina pectoris: Secondary | ICD-10-CM | POA: Diagnosis present

## 2023-08-23 DIAGNOSIS — G40909 Epilepsy, unspecified, not intractable, without status epilepticus: Secondary | ICD-10-CM | POA: Diagnosis not present

## 2023-08-23 DIAGNOSIS — Z8673 Personal history of transient ischemic attack (TIA), and cerebral infarction without residual deficits: Secondary | ICD-10-CM

## 2023-08-23 DIAGNOSIS — I129 Hypertensive chronic kidney disease with stage 1 through stage 4 chronic kidney disease, or unspecified chronic kidney disease: Secondary | ICD-10-CM | POA: Diagnosis present

## 2023-08-23 DIAGNOSIS — Z823 Family history of stroke: Secondary | ICD-10-CM

## 2023-08-23 DIAGNOSIS — M1A9XX0 Chronic gout, unspecified, without tophus (tophi): Secondary | ICD-10-CM

## 2023-08-23 DIAGNOSIS — G4700 Insomnia, unspecified: Secondary | ICD-10-CM | POA: Diagnosis present

## 2023-08-23 DIAGNOSIS — Z794 Long term (current) use of insulin: Secondary | ICD-10-CM

## 2023-08-23 DIAGNOSIS — F319 Bipolar disorder, unspecified: Secondary | ICD-10-CM | POA: Diagnosis present

## 2023-08-23 DIAGNOSIS — G8929 Other chronic pain: Secondary | ICD-10-CM | POA: Diagnosis present

## 2023-08-23 DIAGNOSIS — E1142 Type 2 diabetes mellitus with diabetic polyneuropathy: Secondary | ICD-10-CM | POA: Diagnosis not present

## 2023-08-23 DIAGNOSIS — M79675 Pain in left toe(s): Secondary | ICD-10-CM | POA: Diagnosis not present

## 2023-08-23 DIAGNOSIS — Z886 Allergy status to analgesic agent status: Secondary | ICD-10-CM

## 2023-08-23 DIAGNOSIS — M79674 Pain in right toe(s): Secondary | ICD-10-CM | POA: Diagnosis not present

## 2023-08-23 DIAGNOSIS — E1140 Type 2 diabetes mellitus with diabetic neuropathy, unspecified: Secondary | ICD-10-CM | POA: Diagnosis present

## 2023-08-23 DIAGNOSIS — E860 Dehydration: Secondary | ICD-10-CM | POA: Diagnosis present

## 2023-08-23 DIAGNOSIS — E119 Type 2 diabetes mellitus without complications: Secondary | ICD-10-CM

## 2023-08-23 DIAGNOSIS — M5412 Radiculopathy, cervical region: Secondary | ICD-10-CM | POA: Diagnosis present

## 2023-08-23 DIAGNOSIS — R627 Adult failure to thrive: Secondary | ICD-10-CM | POA: Diagnosis present

## 2023-08-23 DIAGNOSIS — Z6821 Body mass index (BMI) 21.0-21.9, adult: Secondary | ICD-10-CM

## 2023-08-23 DIAGNOSIS — I63512 Cerebral infarction due to unspecified occlusion or stenosis of left middle cerebral artery: Secondary | ICD-10-CM | POA: Diagnosis present

## 2023-08-23 DIAGNOSIS — N1832 Chronic kidney disease, stage 3b: Secondary | ICD-10-CM | POA: Diagnosis present

## 2023-08-23 DIAGNOSIS — F419 Anxiety disorder, unspecified: Secondary | ICD-10-CM | POA: Diagnosis present

## 2023-08-23 DIAGNOSIS — Z87891 Personal history of nicotine dependence: Secondary | ICD-10-CM

## 2023-08-23 DIAGNOSIS — E1122 Type 2 diabetes mellitus with diabetic chronic kidney disease: Secondary | ICD-10-CM | POA: Diagnosis present

## 2023-08-23 DIAGNOSIS — G4733 Obstructive sleep apnea (adult) (pediatric): Secondary | ICD-10-CM | POA: Diagnosis present

## 2023-08-23 DIAGNOSIS — E1159 Type 2 diabetes mellitus with other circulatory complications: Secondary | ICD-10-CM

## 2023-08-23 DIAGNOSIS — F209 Schizophrenia, unspecified: Secondary | ICD-10-CM | POA: Diagnosis present

## 2023-08-23 DIAGNOSIS — F32A Depression, unspecified: Secondary | ICD-10-CM | POA: Diagnosis present

## 2023-08-23 DIAGNOSIS — G8191 Hemiplegia, unspecified affecting right dominant side: Secondary | ICD-10-CM | POA: Diagnosis present

## 2023-08-23 LAB — PROTIME-INR
INR: 1.2 (ref 0.8–1.2)
Prothrombin Time: 15.6 s — ABNORMAL HIGH (ref 11.4–15.2)

## 2023-08-23 LAB — COMPREHENSIVE METABOLIC PANEL
ALT: 19 U/L (ref 0–44)
AST: 30 U/L (ref 15–41)
Albumin: 3.9 g/dL (ref 3.5–5.0)
Alkaline Phosphatase: 48 U/L (ref 38–126)
Anion gap: 10 (ref 5–15)
BUN: 19 mg/dL (ref 6–20)
CO2: 23 mmol/L (ref 22–32)
Calcium: 9 mg/dL (ref 8.9–10.3)
Chloride: 101 mmol/L (ref 98–111)
Creatinine, Ser: 1.66 mg/dL — ABNORMAL HIGH (ref 0.61–1.24)
GFR, Estimated: 47 mL/min — ABNORMAL LOW (ref >60)
Glucose, Bld: 101 mg/dL — ABNORMAL HIGH (ref 70–99)
Potassium: 3.8 mmol/L (ref 3.5–5.1)
Sodium: 134 mmol/L — ABNORMAL LOW (ref 135–145)
Total Bilirubin: 1.1 mg/dL (ref 0.0–1.2)
Total Protein: 6.6 g/dL (ref 6.5–8.1)

## 2023-08-23 LAB — CBC WITH DIFFERENTIAL/PLATELET
Abs Immature Granulocytes: 0.01 10*3/uL (ref 0.00–0.07)
Basophils Absolute: 0 10*3/uL (ref 0.0–0.1)
Basophils Relative: 0 %
Eosinophils Absolute: 0 10*3/uL (ref 0.0–0.5)
Eosinophils Relative: 1 %
HCT: 35.1 % — ABNORMAL LOW (ref 39.0–52.0)
Hemoglobin: 11.5 g/dL — ABNORMAL LOW (ref 13.0–17.0)
Immature Granulocytes: 0 %
Lymphocytes Relative: 30 %
Lymphs Abs: 1.4 10*3/uL (ref 0.7–4.0)
MCH: 30.3 pg (ref 26.0–34.0)
MCHC: 32.8 g/dL (ref 30.0–36.0)
MCV: 92.4 fL (ref 80.0–100.0)
Monocytes Absolute: 0.9 10*3/uL (ref 0.1–1.0)
Monocytes Relative: 18 %
Neutro Abs: 2.4 10*3/uL (ref 1.7–7.7)
Neutrophils Relative %: 51 %
Platelets: 214 10*3/uL (ref 150–400)
RBC: 3.8 MIL/uL — ABNORMAL LOW (ref 4.22–5.81)
RDW: 14.3 % (ref 11.5–15.5)
WBC: 4.7 10*3/uL (ref 4.0–10.5)
nRBC: 0 % (ref 0.0–0.2)

## 2023-08-23 LAB — URINALYSIS, W/ REFLEX TO CULTURE (INFECTION SUSPECTED)
Bilirubin Urine: NEGATIVE
Glucose, UA: NEGATIVE mg/dL
Ketones, ur: NEGATIVE mg/dL
Leukocytes,Ua: NEGATIVE
Nitrite: NEGATIVE
Protein, ur: 30 mg/dL — AB
Specific Gravity, Urine: 1.01 (ref 1.005–1.030)
pH: 7 (ref 5.0–8.0)

## 2023-08-23 LAB — RESP PANEL BY RT-PCR (RSV, FLU A&B, COVID)  RVPGX2
Influenza A by PCR: NEGATIVE
Influenza B by PCR: NEGATIVE
Resp Syncytial Virus by PCR: NEGATIVE
SARS Coronavirus 2 by RT PCR: POSITIVE — AB

## 2023-08-23 LAB — LACTIC ACID, PLASMA
Lactic Acid, Venous: 1.9 mmol/L (ref 0.5–1.9)
Lactic Acid, Venous: 1 mmol/L (ref 0.5–1.9)

## 2023-08-23 LAB — APTT: aPTT: 40 s — ABNORMAL HIGH (ref 24–36)

## 2023-08-23 MED ORDER — SODIUM CHLORIDE 0.9 % IV SOLN
2.0000 g | Freq: Once | INTRAVENOUS | Status: AC
  Administered 2023-08-23: 2 g via INTRAVENOUS
  Filled 2023-08-23: qty 20

## 2023-08-23 MED ORDER — SODIUM CHLORIDE 0.9 % IV SOLN
500.0000 mg | Freq: Once | INTRAVENOUS | Status: AC
  Administered 2023-08-23: 500 mg via INTRAVENOUS
  Filled 2023-08-23: qty 5

## 2023-08-23 MED ORDER — ACETAMINOPHEN 325 MG PO TABS
650.0000 mg | ORAL_TABLET | Freq: Once | ORAL | Status: AC
  Administered 2023-08-23: 650 mg via ORAL
  Filled 2023-08-23: qty 2

## 2023-08-23 MED ORDER — SODIUM CHLORIDE 0.9 % IV BOLUS
2000.0000 mL | Freq: Once | INTRAVENOUS | Status: AC
  Administered 2023-08-23: 2000 mL via INTRAVENOUS

## 2023-08-23 MED ORDER — NIRMATRELVIR/RITONAVIR (PAXLOVID) TABLET (RENAL DOSING)
2.0000 | ORAL_TABLET | Freq: Two times a day (BID) | ORAL | Status: DC
  Administered 2023-08-24 – 2023-08-26 (×6): 2 via ORAL
  Filled 2023-08-23: qty 20

## 2023-08-23 NOTE — Assessment & Plan Note (Signed)
Meeting SIRS criteria with fever of 103, tachycardia heart rate up to 102, and tachypnea respirate rate 19-27.  No respiratory symptoms.  Chest x-ray clear.  Not hypoxic. -Consult pharmacy if patient is a candidate for Paxlovid

## 2023-08-23 NOTE — Sepsis Progress Note (Signed)
Code sepsis protocol being monitored by eLink. 

## 2023-08-23 NOTE — Assessment & Plan Note (Signed)
 Resume Brilinta

## 2023-08-23 NOTE — Assessment & Plan Note (Addendum)
-   Med reconciliation pending -Check Depakote level - resume Depakote and Lamictal

## 2023-08-23 NOTE — Assessment & Plan Note (Addendum)
Stable. -Awaiting med reconciliation

## 2023-08-23 NOTE — H&P (Signed)
History and Physical    Melvin Taylor NWG:[id number] DOB: 1966-04-18 DOA: 08/23/2023  PCP: [name], MD   Patient coming from: Home  I have personally briefly reviewed patient's old medical records in [facility]  Chief Complaint: AMS, fever  HPI: Melvin Taylor is a 58 y.o. male with medical history significant for diabetes mellitus, gout, hypertension, seizure disorder, CKD 3B, stroke. Patient was brought to the ED with reports of confusion, fever, generalized weakness.  Home health nurse came to see patient and called EMS.  My evaluation, patient appears generally weak, slightly lethargic, opens his eyes and answers questions.  He denies difficulty breathing, no cough.  No vomiting no diarrhea no abdominal pain, no urinary symptoms.  ED Course: Tmax 103, maximum temperature 103, Tmax 103, heart rate 85-102, respiratory rate 19-27.  Blood pressure systolic 120s to 578I, O2 sats greater than 97% on room air. COVID test positive. UA with rare bacteria, not suggestive of infection. Chest x-ray negative for acute abnormality. Lactic acid 1.9. IV ceftriaxone and azithromycin started.  2 L bolus given. Hospitalist admit for AKI, encephalopathy, and generalized weakness.  Review of Systems: As per HPI all other systems reviewed and negative.  Past Medical History:  Diagnosis Date   Bipolar 1 disorder (HCC)    depression/anxiety   Bone spur    left heel   Cervical radiculopathy    Chronic back pain    Chronic chest wall pain    Chronic neck pain    Coronary artery disease    a. s/p CABG in 03/2016 with LIMA-LAD, SVG-D1, SVG-RCA, and Seq SVG-mid and distal OM   Diabetes mellitus    Type II   Hallucinations    "long history of them"   Headache(784.0)    History of gout    HTN (hypertension)    Insomnia    Neuropathy    Pain management    Polysubstance abuse (HCC)    Right leg pain    chronic   Schizophrenia (HCC)    Seizures (HCC)    last sz between July  5-9th, 2016; epilepsy   Sleep apnea    Stroke (HCC)    Transfusion of blood product refused for religious reason     Past Surgical History:  Procedure Laterality Date   ANTERIOR CERVICAL DECOMP/DISCECTOMY FUSION N/A 12/14/2015   Procedure: ANTERIOR CERVICAL DECOMPRESSION/DISCECTOMY FUSION CERVICAL FIVE -SIX;  Surgeon: Julio Sicks, MD;  Location: [facility] NEURO ORS;  Service: Neurosurgery;  Laterality: N/A;   BIOPSY  05/07/2015   Procedure: BIOPSY (Gastric);  Surgeon: Corbin Ade, MD;  Location: [facility] ORS;  Service: Endoscopy;;   CARDIAC CATHETERIZATION N/A 03/07/2016   Procedure: Left Heart Cath and Coronary Angiography;  Surgeon: Lyn Records, MD;  Location: [facility] INVASIVE CV LAB;  Service: Cardiovascular;  Laterality: N/A;   COLONOSCOPY WITH PROPOFOL N/A 05/07/2015   ONG:EXBMWUXLKG diverticulosis, multiple colon polyps removed, tubular adenoma, serrated colon polyp. Next colonoscopy October 2019   COLONOSCOPY WITH PROPOFOL N/A 08/02/2018   Procedure: COLONOSCOPY WITH PROPOFOL;  Surgeon: Corbin Ade, MD;  Location: [facility] ENDO SUITE;  Service: Endoscopy;  Laterality: N/A;  12:00pm   CORONARY ARTERY BYPASS GRAFT N/A 03/28/2016   Procedure: CORONARY ARTERY BYPASS GRAFTING (CABG) x 5 USING GREATER SAPHENOUS VEIN;  Surgeon: Alleen Borne, MD;  Location: [facility] OR;  Service: Open Heart Surgery;  Laterality: N/A;   ENDOVEIN HARVEST OF GREATER SAPHENOUS VEIN Right 03/28/2016   Procedure: ENDOVEIN HARVEST OF GREATER SAPHENOUS VEIN;  Surgeon: Alleen Borne, MD;  Location: [facility] OR;  Service: Open Heart Surgery;  Laterality: Right;   ESOPHAGEAL DILATION N/A 05/07/2015   Procedure: ESOPHAGEAL DILATION WITH 56FR MALONEY DILATOR;  Surgeon: Corbin Ade, MD;  Location: [facility] ORS;  Service: Endoscopy;  Laterality: N/A;   ESOPHAGOGASTRODUODENOSCOPY (EGD) WITH PROPOFOL N/A 05/07/2015   RMR: Status post dilation of normal esophagus. Gastritis.   IR CT HEAD LTD  12/17/2020   IR CT HEAD LTD  12/17/2020   IR INTRA CRAN STENT  12/17/2020    IR PERCUTANEOUS ART THROMBECTOMY/INFUSION INTRACRANIAL INC DIAG ANGIO  12/17/2020   IR RADIOLOGIST EVAL & MGMT  03/25/2021   KNEE SURGERY Left    arthroscopy   MANDIBLE FRACTURE SURGERY     POLYPECTOMY  05/07/2015   Procedure: POLYPECTOMY (Hepatic Flexure, Distal Transverse Colon, Rectal);  Surgeon: Corbin Ade, MD;  Location: [facility] ORS;  Service: Endoscopy;;   RADIOLOGY WITH ANESTHESIA N/A 12/16/2020   Procedure: IR WITH ANESTHESIA;  Surgeon: Julieanne Cotton, MD;  Location[facility] OR;  Service: Radiology;  Laterality: N/A;   TEE WITHOUT CARDIOVERSION N/A 03/28/2016   Procedure: TRANSESOPHAGEAL ECHOCARDIOGRAM (TEE);  Surgeon: Alleen Borne, MD;  Location: [facility] OR;  Service: Open Heart Surgery;  Laterality: N/A;   TONSILLECTOMY     TRIGGER FINGER RELEASE Left 10/15/2021   Procedure: RELEASE TRIGGER FINGER/A-1 PULLEY left middle or long finger;  Surgeon: Vickki Hearing, MD;  Location: [facility] ORS;  Service: Orthopedics;  Laterality: Left;     reports that he has quit smoking. His smoking use included cigarettes. He has a 15 pack-year smoking history. He has never used smokeless tobacco. He reports that he does not currently use alcohol. He reports that he does not currently use drugs after having used the following drugs: Marijuana.  Allergies  Allergen Reactions   Aspirin Itching, Swelling and Other (See Comments)    Facial swelling    Family History  Problem Relation Age of Onset   Diabetes Father    Hypertension Father    Sleep apnea Father    Stroke Sister    Arthritis Other    Diabetes Other    Asthma Other     Prior to Admission medications   Medication Sig Start Date End Date Taking? Authorizing Provider  ACCU-CHEK GUIDE test strip USE AS DIRECTED TO MONITOR BLOOD SUGAR (4) TIMES DAILY. 04/24/23   [name], MD  allopurinol (ZYLOPRIM) 100 MG tablet Take 1 tablet by mouth daily. 09/30/21 03/09/24  [provider]  allopurinol (ZYLOPRIM) 100 MG tablet TAKE 1 TABLET BY  MOUTH DAILY. 04/07/23   [name], MD  allopurinol (ZYLOPRIM) 100 MG tablet TAKE ONE TABLET BY MOUTH EVERY DAY 08/07/23   [name], MD  amLODipine (NORVASC) 10 MG tablet Take 10 mg by mouth daily. 11/29/22   [provider]  atorvastatin (LIPITOR) 80 MG tablet TAKE 1 TABLET BY MOUTH ONCE DAILY. 04/07/23   [name], MD  Cholecalciferol (VITAMIN D3) 50 MCG (2000 UT) capsule Take 1 capsule (2,000 Units total) by mouth daily. 03/10/23   [name], MD  divalproex (DEPAKOTE) 250 MG DR tablet TAKE (1) TABLET BY MOUTH TWICE DAILY. 05/09/23   Ihor Austin, NP  escitalopram (LEXAPRO) 20 MG tablet Take 1 tablet (20 mg total) by mouth daily. 09/14/22   Angiulli, Mcarthur Rossetti, PA-C  ferrous sulfate 325 (65 FE) MG tablet Take 325 mg by mouth daily.    [provider]  insulin glargine (  LANTUS SOLOSTAR) 100 UNIT/ML Solostar Pen Inject into the skin. 06/06/19   [provider]  insulin lispro (HUMALOG) 100 UNIT/ML injection Inject into the skin. 06/06/19   [provider]  isosorbide mononitrate (IMDUR) 30 MG 24 hr tablet TAKE ONE TABLET BY MOUTH EVERY DAY 08/07/23   [name], MD  lactulose (CHRONULAC) 10 GM/15ML solution Take 30 mLs (20 g total) by mouth 2 (two) times daily. 12/21/22   Johnson, Clanford L, MD  lamoTRIgine (LAMICTAL) 25 MG tablet TAKE 2 TABLETS BY MOUTH TWICE DAILY. 06/02/23   Penumalli, Glenford Bayley, MD  mirtazapine (REMERON) 15 MG tablet Take 15 mg by mouth at bedtime. 10/24/22   [provider]  Multiple Vitamin (MULTIVITAMIN WITH MINERALS) TABS tablet Take 1 tablet by mouth daily. 09/14/22   Angiulli, Mcarthur Rossetti, PA-C  risperiDONE (RISPERDAL) 2 MG tablet Take by mouth. 09/28/18   [provider]  ticagrelor (BRILINTA) 90 MG TABS tablet Take 1 tablet (90 mg total) by mouth 2 (two) times daily. 09/14/22   Angiulli, Mcarthur Rossetti, PA-C  UNABLE TO FIND 1 each by Does not apply route daily. Med Name: GLOVES 1B/M OR PRN 06/20/23   [name], MD  UNABLE TO FIND 1 each by Does not apply route daily. Med Name: PULL UPS 100/M OR PRN 06/20/23   [name], MD  UNABLE TO FIND 1 each by Does not apply route daily. Med Name: NUTRITIONAL SUPPLEMENT 180U/M OR PRN 06/20/23   [name], MD  UNABLE TO FIND 1 each by Does not apply route daily. Med Name: UNDER PADS/CHUX 30/M OR PRN 06/20/23   [name], MD  Vitamin D, Ergocalciferol, (DRISDOL) 1.25 MG (50000 UNIT) CAPS capsule Take 1 capsule (50,000 Units total) by mouth every 7 (seven) days. 09/14/22   Charlton Amor, PA-C    Physical Exam: Vitals:   08/23/23 1916 08/23/23 1930 08/23/23 2030 08/23/23 2146  BP:  (!) 141/85 133/82   Pulse:  (!) 102 89   Resp:  (!) 27 (!) 22   Temp:    (!) 100.5 F (38.1 C)  TempSrc:    Oral  SpO2: 99% 99% 97%   Weight:      Height:        Constitutional: Generalized weakness, mildly lethargic, calm, comfortable Vitals:   08/23/23 1916 08/23/23 1930 08/23/23 2030 08/23/23 2146  BP:  (!) 141/85 133/82   Pulse:  (!) 102 89   Resp:  (!) 27 (!) 22   Temp:    (!) 100.5 F (38.1 C)  TempSrc:    Oral  SpO2: 99% 99% 97%   Weight:      Height:       Eyes: PERRL, lids and conjunctivae normal ENMT: Mucous membranes are dry  Neck: normal, supple, no masses, no thyromegaly Respiratory: clear to auscultation bilaterally, no wheezing, no crackles. Normal respiratory effort. No accessory muscle use.  Cardiovascular: Regular rate and rhythm, no murmurs / rubs / gallops. No extremity edema.  Extremities warm. Abdomen: no tenderness, no masses palpated. No hepatosplenomegaly. Bowel sounds positive.  Musculoskeletal: no clubbing / cyanosis. No joint deformity upper and lower extremities.  Skin: no rashes, lesions, ulcers. No induration Neurologic: Moving lower extremities against gravity, okay grip strength bilateral upper extremity, no facial asymmetry,  speech fluent.  Psychiatric: Mildly lethargic, able to answer questions,  oriented to person and somewhat to place but not situation.  Labs on Admission: I have personally reviewed following labs and  imaging studies  CBC: Recent Labs  Lab 08/18/23 0948 08/23/23 1840  WBC 4.7 4.7  NEUTROABS  --  2.4  HGB 10.4* 11.5*  HCT 31.6* 35.1*  MCV 93.2 92.4  PLT 176 214   Basic Metabolic Panel: Recent Labs  Lab 08/23/23 1840  NA 134*  K 3.8  CL 101  CO2 23  GLUCOSE 101*  BUN 19  CREATININE 1.66*  CALCIUM 9.0   GFR: Estimated Creatinine Clearance: 42 mL/min (A) (by C-G formula based on SCr of 1.66 mg/dL (H)). Liver Function Tests: Recent Labs  Lab 08/23/23 1840  AST 30  ALT 19  ALKPHOS 48  BILITOT 1.1  PROT 6.6  ALBUMIN 3.9   Coagulation Profile: Recent Labs  Lab 08/23/23 1840  INR 1.2   Urine analysis:    Component Value Date/Time   COLORURINE YELLOW 08/23/2023 1857   APPEARANCEUR CLEAR 08/23/2023 1857   LABSPEC 1.010 08/23/2023 1857   PHURINE 7.0 08/23/2023 1857   GLUCOSEU NEGATIVE 08/23/2023 1857   HGBUR MODERATE (A) 08/23/2023 1857   BILIRUBINUR NEGATIVE 08/23/2023 1857   KETONESUR NEGATIVE 08/23/2023 1857   PROTEINUR 30 (A) 08/23/2023 1857   UROBILINOGEN 0.2 10/15/2014 1430   NITRITE NEGATIVE 08/23/2023 1857   LEUKOCYTESUR NEGATIVE 08/23/2023 1857    Radiological Exams on Admission: DG Chest Port 1 View Result Date: 08/23/2023 CLINICAL DATA:  Altered level of consciousness, fever, questionable sepsis EXAM: PORTABLE CHEST 1 VIEW COMPARISON:  12/16/2022 FINDINGS: Single frontal view of the chest demonstrates postsurgical changes from CABG. No acute airspace disease, effusion, or pneumothorax. No acute bony abnormalities. IMPRESSION: 1. No acute intrathoracic process. Electronically Signed   By: Sharlet Salina M.D.   On: 08/23/2023 18:50    EKG: Independently reviewed.  Tachycardic, heart rate 102, QTc prolonged at 683.  LAFB.  Diffuse T wave abnormality similar to prior EKG.  Assessment/Plan Principal Problem:   Acute  metabolic encephalopathy Active Problems:   COVID-19 virus infection   AKI (acute kidney injury) (HCC)   Seizure disorder (HCC)   Primary hypertension   Anxiety and depression   Diabetes mellitus (HCC)   Left middle cerebral artery stroke (HCC)   OSA (obstructive sleep apnea)   Gout  Assessment and Plan: * Acute metabolic encephalopathy Generalized weakness no focal deficits, in the setting of COVID-19 infection.  No headache, pupils equal.  Not on anticoagulation.  Appears dehydrated. -2 L bolus given, continue N/s 75cc/hr x 20hrs -home meds- Depakote, risperidone, Lexapro, held for now pending med rec  AKI (acute kidney injury) (HCC) Creatinine elevated 1.6, baseline 1.1-1.2.  Likely secondary to dehydration from viral infection. -Hydrate  COVID-19 virus infection Meeting SIRS criteria with fever of 103, tachycardia heart rate up to 102, and tachypnea respirate rate 19-27.  No respiratory symptoms.  Chest x-ray clear.  Not hypoxic. -Consult pharmacy if patient is a candidate for Paxlovid  Seizure disorder (HCC) - Med reconciliation pending -Check Depakote level - resume Depakote and Lamictal  Gout Resume allopurinol  Left middle cerebral artery stroke (HCC) Resume Brilinta  Diabetes mellitus (HCC) - SSI- S - HgbA1c -Hold home insulin  Anxiety and depression Med reconciliation pending, QTc prolonged.  Med list-patient is on risperidone, Depakote, Lexapro, mirtazapine  Primary hypertension Stable. -Awaiting med reconciliation  Prolong QT-683.  Not new.  He is on antipsychotics, antidepressants. -Hold QT prolonging meds for now -Check magnesium  DVT prophylaxis: Heparin Code Status: Full code Family Communication: None at bedside Disposition Plan: ~ 2 days Consults called: none Admission status:  Obs Med surg    Author: Onnie Boer, MD 08/23/2023 10:58 PM  For on call review [email or web address].

## 2023-08-23 NOTE — Assessment & Plan Note (Signed)
Creatinine elevated 1.6, baseline 1.1-1.2.  Likely secondary to dehydration from viral infection. -Hydrate

## 2023-08-23 NOTE — ED Notes (Signed)
ED TO INPATIENT HANDOFF REPORT  ED Nurse Name and Phone #:   S Name/Age/Gender Melvin Taylor Melvin Taylor 58 y.o. male Room/Bed: APA10/APA10  Code Status   Code Status: Prior  Home/SNF/Other Home Patient oriented to: self, place, time, and situation Is this baseline? Yes   Triage Complete: Triage complete  Chief Complaint Acute metabolic encephalopathy [G93.41]  Triage Note Home health nurse called ems for pt being altered and running a fever.    Allergies Allergies  Allergen Reactions   Aspirin Itching, Swelling and Other (See Comments)    Facial swelling    Level of Care/Admitting Diagnosis ED Disposition     ED Disposition  Admit   Condition  --   Comment  Hospital Area: [facility] [[id number]]  Level of Care: Med-Surg [16]  Covid Evaluation: Confirmed COVID Positive  Diagnosis: Acute metabolic encephalopathy [[id number]]  Admitting Physician: Onnie Boer [[id number]]  Attending Physician: Onnie Boer Melvin.Taylor          B Medical/Surgery History Past Medical History:  Diagnosis Date   Bipolar 1 disorder (HCC)    depression/anxiety   Bone spur    left heel   Cervical radiculopathy    Chronic back pain    Chronic chest wall pain    Chronic neck pain    Coronary artery disease    a. s/p CABG in 03/2016 with LIMA-LAD, SVG-D1, SVG-RCA, and Seq SVG-mid and distal OM   Diabetes mellitus    Type II   Hallucinations    "long history of them"   Headache(784.0)    History of gout    HTN (hypertension)    Insomnia    Neuropathy    Pain management    Polysubstance abuse (HCC)    Right leg pain    chronic   Schizophrenia (HCC)    Seizures (HCC)    last sz between July 5-9th, 2016; epilepsy   Sleep apnea    Stroke (HCC)    Transfusion of blood product refused for religious reason    Past Surgical History:  Procedure Laterality Date   ANTERIOR CERVICAL DECOMP/DISCECTOMY FUSION N/A 12/14/2015   Procedure: ANTERIOR CERVICAL  DECOMPRESSION/DISCECTOMY FUSION CERVICAL FIVE -SIX;  Surgeon: Julio Sicks, MD;  Location: [facility] NEURO ORS;  Service: Neurosurgery;  Laterality: N/A;   BIOPSY  05/07/2015   Procedure: BIOPSY (Gastric);  Surgeon: Corbin Ade, MD;  Location: [facility] ORS;  Service: Endoscopy;;   CARDIAC CATHETERIZATION N/A 03/07/2016   Procedure: Left Heart Cath and Coronary Angiography;  Surgeon: Lyn Records, MD;  Location: [facility] INVASIVE CV LAB;  Service: Cardiovascular;  Laterality: N/A;   COLONOSCOPY WITH PROPOFOL N/A 05/07/2015   NGE:[id number] diverticulosis, multiple colon polyps removed, tubular adenoma, serrated colon polyp. Next colonoscopy October 2019   COLONOSCOPY WITH PROPOFOL N/A 08/02/2018   Procedure: COLONOSCOPY WITH PROPOFOL;  Surgeon: Corbin Ade, MD;  Location: [facility] ENDO SUITE;  Service: Endoscopy;  Laterality: N/A;  12:00pm   CORONARY ARTERY BYPASS GRAFT N/A 03/28/2016   Procedure: CORONARY ARTERY BYPASS GRAFTING (CABG) x 5 USING GREATER SAPHENOUS VEIN;  Surgeon: Alleen Borne, MD;  Location: [facility] OR;  Service: Open Heart Surgery;  Laterality: N/A;   ENDOVEIN HARVEST OF GREATER SAPHENOUS VEIN Right 03/28/2016   Procedure: ENDOVEIN HARVEST OF GREATER SAPHENOUS VEIN;  Surgeon: Alleen Borne, MD;  Location: [facility] OR;  Service: Open Heart Surgery;  Laterality: Right;   ESOPHAGEAL DILATION N/A 05/07/2015   Procedure: ESOPHAGEAL DILATION WITH 56FR MALONEY DILATOR;  Surgeon: Gerrit Friends  Rourk, MD;  Location: [facility] ORS;  Service: Endoscopy;  Laterality: N/A;   ESOPHAGOGASTRODUODENOSCOPY (EGD) WITH PROPOFOL N/A 05/07/2015   RMR: Status post dilation of normal esophagus. Gastritis.   IR CT HEAD LTD  12/17/2020   IR CT HEAD LTD  12/17/2020   IR INTRA CRAN STENT  12/17/2020   IR PERCUTANEOUS ART THROMBECTOMY/INFUSION INTRACRANIAL INC DIAG ANGIO  12/17/2020   IR RADIOLOGIST EVAL & MGMT  03/25/2021   KNEE SURGERY Left    arthroscopy   MANDIBLE FRACTURE SURGERY     POLYPECTOMY  05/07/2015   Procedure: POLYPECTOMY (Hepatic Flexure,  Distal Transverse Colon, Rectal);  Surgeon: Corbin Ade, MD;  Location: [facility] ORS;  Service: Endoscopy;;   RADIOLOGY WITH ANESTHESIA N/A 12/16/2020   Procedure: IR WITH ANESTHESIA;  Surgeon: Julieanne Cotton, MD;  Location: [facility] OR;  Service: Radiology;  Laterality: N/A;   TEE WITHOUT CARDIOVERSION N/A 03/28/2016   Procedure: TRANSESOPHAGEAL ECHOCARDIOGRAM (TEE);  Surgeon: Alleen Borne, MD;  [facility] OR;  Service: Open Heart Surgery;  Laterality: N/A;   TONSILLECTOMY     TRIGGER FINGER RELEASE Left 10/15/2021   Procedure: RELEASE TRIGGER FINGER/A-1 PULLEY left middle or long finger;  Surgeon: Vickki Hearing, MD;  Location: [facility] ORS;  Service: Orthopedics;  Laterality: Left;     A IV Location/Drains/Wounds Patient Lines/Drains/Airways Status     Active Line/Drains/Airways     Name Placement date Placement time Site Days   Peripheral IV 08/23/23 20 G Anterior;Distal;Right;Upper Arm 08/23/23  --  Arm  less than 1            Intake/Output Last 24 hours No intake or output data in the 24 hours ending 08/23/23 2313  Labs/Imaging Results for orders placed or performed during the hospital encounter of 08/23/23 (from the past 48 hours)  Lactic acid, plasma     Status: None   Collection Time: 08/23/23  6:40 PM  Result Value Ref Range   Lactic Acid, Venous 1.9 0.5 - 1.9 mmol/L    Comment: Performed at [facility], [address], [place], [place] [address]  Comprehensive metabolic panel     Status: Abnormal   Collection Time: 08/23/23  6:40 PM  Result Value Ref Range   Sodium 134 (L) 135 - 145 mmol/L   Potassium 3.8 3.5 - 5.1 mmol/L   Chloride 101 98 - 111 mmol/L   CO2 23 22 - 32 mmol/L   Glucose, Bld 101 (H) 70 - 99 mg/dL    Comment: Glucose reference range applies only to samples taken after fasting for at least 8 hours.   BUN 19 6 - 20 mg/dL   Creatinine, Ser 7.84 (H) 0.61 - 1.24 mg/dL   Calcium 9.0 8.9 - 69.6 mg/dL   Total Protein 6.6 6.5 - 8.1 g/dL   Albumin 3.9 3.5 -  5.0 g/dL   AST 30 15 - 41 U/L   ALT 19 0 - 44 U/L   Alkaline Phosphatase 48 38 - 126 U/L   Total Bilirubin 1.1 0.0 - 1.2 mg/dL   GFR, Estimated 47 (L) >60 mL/min    Comment: (NOTE) Calculated using the CKD-EPI Creatinine Equation (2021)    Anion gap 10 5 - 15    Comment: Performed at [facility], [address], [place], [place] [address]  CBC with Differential     Status: Abnormal   Collection Time: 08/23/23  6:40 PM  Result Value Ref Range   WBC 4.7 4.0 - 10.5 K/uL   RBC 3.80 (  L) 4.22 - 5.81 MIL/uL   Hemoglobin 11.5 (L) 13.0 - 17.0 g/dL   HCT 95.6 (L) 21.3 - 08.6 %   MCV 92.4 80.0 - 100.0 fL   MCH 30.3 26.0 - 34.0 pg   MCHC 32.8 30.0 - 36.0 g/dL   RDW 57.8 46.9 - 62.9 %   Platelets 214 150 - 400 K/uL   nRBC 0.0 0.0 - 0.2 %   Neutrophils Relative % 51 %   Neutro Abs 2.4 1.7 - 7.7 K/uL   Lymphocytes Relative 30 %   Lymphs Abs 1.4 0.7 - 4.0 K/uL   Monocytes Relative 18 %   Monocytes Absolute 0.9 0.1 - 1.0 K/uL   Eosinophils Relative 1 %   Eosinophils Absolute 0.0 0.0 - 0.5 K/uL   Basophils Relative 0 %   Basophils Absolute 0.0 0.0 - 0.1 K/uL   Immature Granulocytes 0 %   Abs Immature Granulocytes 0.01 0.00 - 0.07 K/uL    Comment: Performed at [facility], [address] [place], [place] [address]  Protime-INR     Status: Abnormal   Collection Time: 08/23/23  6:40 PM  Result Value Ref Range   Prothrombin Time 15.6 (H) 11.4 - 15.2 seconds   INR 1.2 0.8 - 1.2    Comment: (NOTE) INR goal varies based on device and disease states. Performed at [facility], [address], [place], [place] [address]   APTT     Status: Abnormal   Collection Time: 08/23/23  6:40 PM  Result Value Ref Range   aPTT 40 (H) 24 - 36 seconds    Comment:        IF BASELINE aPTT IS ELEVATED, SUGGEST PATIENT RISK ASSESSMENT BE USED TO DETERMINE APPROPRIATE ANTICOAGULANT THERAPY. Performed at [facility], [address], [place], [place] [address]   Blood Culture (routine x 2)     Status: None  (Preliminary result)   Collection Time: 08/23/23  6:40 PM   Specimen: Right Antecubital; Blood  Result Value Ref Range   Specimen Description RIGHT ANTECUBITAL    Special Requests      BOTTLES DRAWN AEROBIC AND ANAEROBIC Blood Culture adequate volume Performed at [facility], [address], [place], [place] [address]    Culture PENDING    Report Status PENDING   Blood Culture (routine x 2)     Status: None (Preliminary result)   Collection Time: 08/23/23  6:50 PM   Specimen: Left Antecubital; Blood  Result Value Ref Range   Specimen Description LEFT ANTECUBITAL    Special Requests      BOTTLES DRAWN AEROBIC AND ANAEROBIC Blood Culture adequate volume Performed at [facility], [address] [place], [place] [address]    Culture PENDING    Report Status PENDING   Resp panel by RT-PCR (RSV, Flu A&B, Covid) Anterior Nasal Swab     Status: Abnormal   Collection Time: 08/23/23  6:57 PM   Specimen: Anterior Nasal Swab  Result Value Ref Range   SARS Coronavirus 2 by RT PCR POSITIVE (A) NEGATIVE    Comment: (NOTE) SARS-CoV-2 target nucleic acids are DETECTED.  The SARS-CoV-2 RNA is generally detectable in upper respiratory specimens during the acute phase of infection. Positive results are indicative of the presence of the identified virus, but do not rule out bacterial infection or co-infection with other pathogens not detected by the test. Clinical correlation with patient history and other diagnostic information is necessary to determine patient infection status. The expected result is Negative.  Fact Sheet for Patients: BloggerCourse.com  Fact Sheet for Healthcare Providers: SeriousBroker.it  This test is not yet approved or cleared by the [place] FDA and  has been authorized for detection and/or diagnosis of SARS-CoV-2 by FDA under an Emergency Use Authorization (EUA).  This EUA will remain in effect (meaning this  test can be used) for the duration of  the COVID-19 declaration under Section 564(b)(1) of the A ct, 21 U.S.C. section 360bbb-3(b)(1), unless the authorization is terminated or revoked sooner.     Influenza A by PCR NEGATIVE NEGATIVE   Influenza B by PCR NEGATIVE NEGATIVE    Comment: (NOTE) The Xpert Xpress SARS-CoV-2/FLU/RSV plus assay is intended as an aid in the diagnosis of influenza from Nasopharyngeal swab specimens and should not be used as a sole basis for treatment. Nasal washings and aspirates are unacceptable for Xpert Xpress SARS-CoV-2/FLU/RSV testing.  Fact Sheet for Patients: BloggerCourse.com  Fact Sheet for Healthcare Providers: SeriousBroker.it  This test is not yet approved or cleared by the [place] FDA and has been authorized for detection and/or diagnosis of SARS-CoV-2 by FDA under an Emergency Use Authorization (EUA). This EUA will remain in effect (meaning this test can be used) for the duration of the COVID-19 declaration under Section 564(b)(1) of the Act, 21 U.S.C. section 360bbb-3(b)(1), unless the authorization is terminated or revoked.     Resp Syncytial Virus by PCR NEGATIVE NEGATIVE    Comment: (NOTE) Fact Sheet for Patients: BloggerCourse.com  Fact Sheet for Healthcare Providers: SeriousBroker.it  This test is not yet approved or cleared by the [place] FDA and has been authorized for detection and/or diagnosis of SARS-CoV-2 by FDA under an Emergency Use Authorization (EUA). This EUA will remain in effect (meaning this test can be used) for the duration of the COVID-19 declaration under Section 564(b)(1) of the Act, 21 U.S.C. section 360bbb-3(b)(1), unless the authorization is terminated or revoked.  Performed at [facility], [address], [place], [place] [address]   Urinalysis, w/ Reflex to Culture (Infection Suspected) -Urine,  Clean Catch     Status: Abnormal   Collection Time: 08/23/23  6:57 PM  Result Value Ref Range   Specimen Source URINE, CATHETERIZED     Comment: CORRECTED ON 01/22 AT 1917: PREVIOUSLY REPORTED AS URINE, CLEAN CATCH   Color, Urine YELLOW YELLOW   APPearance CLEAR CLEAR   Specific Gravity, Urine 1.010 1.005 - 1.030   pH 7.0 5.0 - 8.0   Glucose, UA NEGATIVE NEGATIVE mg/dL   Hgb urine dipstick MODERATE (A) NEGATIVE   Bilirubin Urine NEGATIVE NEGATIVE   Ketones, ur NEGATIVE NEGATIVE mg/dL   Protein, ur 30 (A) NEGATIVE mg/dL   Nitrite NEGATIVE NEGATIVE   Leukocytes,Ua NEGATIVE NEGATIVE   RBC / HPF 6-10 0 - 5 RBC/hpf   WBC, UA 0-5 0 - 5 WBC/hpf    Comment:        Reflex urine culture not performed if WBC <=10, OR if Squamous epithelial cells >5. If Squamous epithelial cells >5 suggest recollection.    Bacteria, UA RARE (A) NONE SEEN   Squamous Epithelial / HPF 0-5 0 - 5 /HPF    Comment: Performed at [facility], [address], [place], [place] [address]  Lactic acid, plasma     Status: None   Collection Time: 08/23/23  8:28 PM  Result Value Ref Range   Lactic Acid, Venous 1.0 0.5 - 1.9 mmol/L    Comment: Performed at [facility], [address], [place], [place]  [address]   *Note: Due to a large number of results and/or encounters for the requested time period, some results have not been displayed. A complete set of results can be found in Results Review.   DG Chest Port 1 View Result Date: 08/23/2023 CLINICAL DATA:  Altered level of consciousness, fever, questionable sepsis EXAM: PORTABLE CHEST 1 VIEW COMPARISON:  12/16/2022 FINDINGS: Single frontal view of the chest demonstrates postsurgical changes from CABG. No acute airspace disease, effusion, or pneumothorax. No acute bony abnormalities. IMPRESSION: 1. No acute intrathoracic process. Electronically Signed   By: Sharlet Salina M.D.   On: 08/23/2023 18:50    Pending Labs Unresulted Labs (From admission, onward)    None        Vitals/Pain Today's Vitals   08/23/23 1916 08/23/23 1930 08/23/23 2030 08/23/23 2146  BP:  (!) 141/85 133/82   Pulse:  (!) 102 89   Resp:  (!) 27 (!) 22   Temp:    (!) 100.5 F (38.1 C)  TempSrc:    Oral  SpO2: 99% 99% 97%   Weight:      Height:      PainSc:        Isolation Precautions No active isolations  Medications Medications  cefTRIAXone (ROCEPHIN) 2 g in sodium chloride 0.9 % 100 mL IVPB (0 g Intravenous Stopped 08/23/23 1928)  azithromycin (ZITHROMAX) 500 mg in sodium chloride 0.9 % 250 mL IVPB (0 mg Intravenous Stopped 08/23/23 2028)  sodium chloride 0.9 % bolus 2,000 mL (0 mLs Intravenous Stopped 08/23/23 2122)  acetaminophen (TYLENOL) tablet 650 mg (650 mg Oral Given 08/23/23 1857)    Mobility walks with person assist     Focused Assessments    R Recommendations: See Admitting Provider Note  Report given to:   Additional Notes:

## 2023-08-23 NOTE — ED Provider Notes (Signed)
[place] EMERGENCY DEPARTMENT AT [facility] Provider Note   CSN: [id number] Arrival date & time: 08/23/23  1754     History  Chief Complaint  Patient presents with   Altered Mental Status    Melvin Taylor is a 58 y.o. male.  Patient has a history of hypertension diabetes seizures and stroke.  He had a temperature of 103 today and has been extremely fatigued and confused  The history is provided by the patient and a friend. No language interpreter was used.  Altered Mental Status Presenting symptoms: disorientation   Severity:  Moderate Most recent episode:  Today Episode history:  Continuous Timing:  Constant Progression:  Worsening Chronicity:  New Context: not alcohol use   Associated symptoms: weakness   Associated symptoms: no abdominal pain, no hallucinations, no headaches, no rash and no seizures        Home Medications Prior to Admission medications   Medication Sig Start Date End Date Taking? Authorizing Provider  ACCU-CHEK GUIDE test strip USE AS DIRECTED TO MONITOR BLOOD SUGAR (4) TIMES DAILY. 04/24/23   [name], MD  allopurinol (ZYLOPRIM) 100 MG tablet Take 1 tablet by mouth daily. 09/30/21 03/09/24  [provider]  allopurinol (ZYLOPRIM) 100 MG tablet TAKE 1 TABLET BY MOUTH DAILY. 04/07/23   [name], MD  allopurinol (ZYLOPRIM) 100 MG tablet TAKE ONE TABLET BY MOUTH EVERY DAY 08/07/23   [name], MD  amLODipine (NORVASC) 10 MG tablet Take 10 mg by mouth daily. 11/29/22   [provider]  atorvastatin (LIPITOR) 80 MG tablet TAKE 1 TABLET BY MOUTH ONCE DAILY. 04/07/23   [name], MD  Cholecalciferol (VITAMIN D3) 50 MCG (2000 UT) capsule Take 1 capsule (2,000 Units total) by mouth daily. 03/10/23   [name], MD  divalproex (DEPAKOTE) 250 MG DR tablet TAKE (1) TABLET BY MOUTH TWICE DAILY. 05/09/23   Ihor Austin, NP  escitalopram (LEXAPRO) 20 MG tablet Take 1 tablet (20 mg total) by mouth daily.  09/14/22   Angiulli, Mcarthur Rossetti, PA-C  ferrous sulfate 325 (65 FE) MG tablet Take 325 mg by mouth daily.    [provider]  insulin glargine (LANTUS SOLOSTAR) 100 UNIT/ML Solostar Pen Inject into the skin. 06/06/19   [provider]  insulin lispro (HUMALOG) 100 UNIT/ML injection Inject into the skin. 06/06/19   [provider]  isosorbide mononitrate (IMDUR) 30 MG 24 hr tablet TAKE ONE TABLET BY MOUTH EVERY DAY 08/07/23   [name], MD  lactulose (CHRONULAC) 10 GM/15ML solution Take 30 mLs (20 g total) by mouth 2 (two) times daily. 12/21/22   Johnson, Clanford L, MD  lamoTRIgine (LAMICTAL) 25 MG tablet TAKE 2 TABLETS BY MOUTH TWICE DAILY. 06/02/23   Penumalli, Glenford Bayley, MD  mirtazapine (REMERON) 15 MG tablet Take 15 mg by mouth at bedtime. 10/24/22   [provider]  Multiple Vitamin (MULTIVITAMIN WITH MINERALS) TABS tablet Take 1 tablet by mouth daily. 09/14/22   Angiulli, Mcarthur Rossetti, PA-C  risperiDONE (RISPERDAL) 2 MG tablet Take by mouth. 09/28/18   [provider]  ticagrelor (BRILINTA) 90 MG TABS tablet Take 1 tablet (90 mg total) by mouth 2 (two) times daily. 09/14/22   Angiulli, Mcarthur Rossetti, PA-C  UNABLE TO FIND 1 each by Does not apply route daily. Med Name: GLOVES 1B/M OR PRN 06/20/23   [name], MD  UNABLE TO FIND 1 each by Does not apply route daily. Med Name: PULL  UPS 100/M OR PRN 06/20/23   [name], MD  UNABLE TO FIND 1 each by Does not apply route daily. Med Name: NUTRITIONAL SUPPLEMENT 180U/M OR PRN 06/20/23   [name], MD  UNABLE TO FIND 1 each by Does not apply route daily. Med Name: UNDER PADS/CHUX 30/M OR PRN 06/20/23   [name], MD  Vitamin D, Ergocalciferol, (DRISDOL) 1.25 MG (50000 UNIT) CAPS capsule Take 1 capsule (50,000 Units total) by mouth every 7 (seven) days. 09/14/22   Angiulli, Mcarthur Rossetti, PA-C      Allergies    Aspirin    Review of Systems   Review of Systems  Constitutional:  Negative for  appetite change and fatigue.  HENT:  Negative for congestion, ear discharge and sinus pressure.   Eyes:  Negative for discharge.  Respiratory:  Negative for cough.   Cardiovascular:  Negative for chest pain.  Gastrointestinal:  Negative for abdominal pain and diarrhea.  Genitourinary:  Negative for frequency and hematuria.  Musculoskeletal:  Negative for back pain.  Skin:  Negative for rash.  Neurological:  Positive for weakness. Negative for seizures and headaches.  Psychiatric/Behavioral:  Negative for hallucinations.     Physical Exam Updated Vital Signs BP 133/82   Pulse 89   Temp (!) 100.5 F (38.1 C) (Oral)   Resp (!) 22   Ht 5\' 7"  (1.702 m)   Wt 62 kg   SpO2 97%   BMI 21.41 kg/m  Physical Exam Vitals and nursing note reviewed.  Constitutional:      Appearance: He is well-developed.  HENT:     Head: Normocephalic.     Nose: Nose normal.  Eyes:     General: No scleral icterus.    Conjunctiva/sclera: Conjunctivae normal.  Neck:     Thyroid: No thyromegaly.  Cardiovascular:     Rate and Rhythm: Normal rate and regular rhythm.     Heart sounds: No murmur heard.    No friction rub. No gallop.  Pulmonary:     Breath sounds: No stridor. No wheezing or rales.  Chest:     Chest wall: No tenderness.  Abdominal:     General: There is no distension.     Tenderness: There is no abdominal tenderness. There is no rebound.  Musculoskeletal:        General: Normal range of motion.     Cervical back: Neck supple.  Lymphadenopathy:     Cervical: No cervical adenopathy.  Skin:    Findings: No erythema or rash.  Neurological:     Mental Status: He is alert.     Motor: No abnormal muscle tone.     Coordination: Coordination normal.     Comments: Oriented to person only  Psychiatric:        Behavior: Behavior normal.     ED Results / Procedures / Treatments   Labs (all labs ordered are listed, but only abnormal results are displayed) Labs Reviewed  RESP PANEL BY  RT-PCR (RSV, FLU A&B, COVID)  [id number] - Abnormal; Notable for the following components:      Result Value   SARS Coronavirus 2 by RT PCR POSITIVE (*)    All other components within normal limits  COMPREHENSIVE METABOLIC PANEL - Abnormal; Notable for the following components:   Sodium 134 (*)    Glucose, Bld 101 (*)    Creatinine, Ser 1.66 (*)    GFR, Estimated 47 (*)    All other components within normal limits  CBC WITH DIFFERENTIAL/PLATELET - Abnormal; Notable for the following components:   RBC 3.80 (*)    Hemoglobin 11.5 (*)    HCT 35.1 (*)    All other components within normal limits  PROTIME-INR - Abnormal; Notable for the following components:   Prothrombin Time 15.6 (*)    All other components within normal limits  APTT - Abnormal; Notable for the following components:   aPTT 40 (*)    All other components within normal limits  URINALYSIS, W/ REFLEX TO CULTURE (INFECTION SUSPECTED) - Abnormal; Notable for the following components:   Hgb urine dipstick MODERATE (*)    Protein, ur 30 (*)    Bacteria, UA RARE (*)    All other components within normal limits  CULTURE, BLOOD (ROUTINE X 2)  CULTURE, BLOOD (ROUTINE X 2)  LACTIC ACID, PLASMA  LACTIC ACID, PLASMA    EKG None  Radiology DG Chest Port 1 View Result Date: 08/23/2023 CLINICAL DATA:  Altered level of consciousness, fever, questionable sepsis EXAM: PORTABLE CHEST 1 VIEW COMPARISON:  12/16/2022 FINDINGS: Single frontal view of the chest demonstrates postsurgical changes from CABG. No acute airspace disease, effusion, or pneumothorax. No acute bony abnormalities. IMPRESSION: 1. No acute intrathoracic process. Electronically Signed   By: Sharlet Salina M.D.   On: 08/23/2023 18:50    Procedures Procedures    Medications Ordered in ED Medications  cefTRIAXone (ROCEPHIN) 2 g in sodium chloride 0.9 % 100 mL IVPB (0 g Intravenous Stopped 08/23/23 1928)  azithromycin (ZITHROMAX) 500 mg in sodium chloride 0.9 % 250 mL  IVPB (0 mg Intravenous Stopped 08/23/23 2028)  sodium chloride 0.9 % bolus 2,000 mL (0 mLs Intravenous Stopped 08/23/23 2122)  acetaminophen (TYLENOL) tablet 650 mg (650 mg Oral Given 08/23/23 1857)    ED Course/ Medical Decision Making/ A&P                                 Medical Decision Making Amount and/or Complexity of Data Reviewed Labs: ordered. Radiology: ordered.  Risk OTC drugs. Decision regarding hospitalization.   Patient is being admitted for COVID-19 along with failure to thrive        Final Clinical Impression(s) / ED Diagnoses Final diagnoses:  COVID-19    Rx / DC Orders ED Discharge Orders     None         Bethann Berkshire, MD 08/28/23 1226

## 2023-08-23 NOTE — Progress Notes (Signed)
This patient returns to my office for at risk foot care.  This patient requires this care by a professional since this patient will be at risk due to having diabetic neuropathy.  This patient is unable to cut nails himself since the patient cannot reach his nails.These nails are painful walking and wearing shoes.  This patient presents for at risk foot care today.  General Appearance  Alert, conversant and in no acute stress.  Vascular  Dorsalis pedis and posterior tibial  pulses are palpable  bilaterally.  Capillary return is within normal limits  bilaterally. Temperature is within normal limits  bilaterally.  Neurologic  Senn-Weinstein monofilament wire test within normal limits  bilaterally. Muscle power within normal limits bilaterally.  Nails Thick disfigured discolored nails with subungual debris  from hallux to fifth toes bilaterally. No evidence of bacterial infection or drainage bilaterally.  Orthopedic  No limitations of motion  feet .  No crepitus or effusions noted.  No bony pathology or digital deformities noted.  Skin  normotropic skin with no porokeratosis noted bilaterally.  No signs of infections or ulcers noted.     Onychomycosis  Pain in right toes  Pain in left toes  Consent was obtained for treatment procedures.   Mechanical debridement of nails 1-5  bilaterally performed with a nail nipper.  Filed with dremel without incident.    Return office visit   4 months                   Told patient to return for periodic foot care and evaluation due to potential at risk complications.   Helane Gunther DPM

## 2023-08-23 NOTE — Assessment & Plan Note (Signed)
Resume allopurinol 

## 2023-08-23 NOTE — Assessment & Plan Note (Addendum)
-   SSI- S - HgbA1c -Hold home insulin

## 2023-08-23 NOTE — Assessment & Plan Note (Addendum)
Generalized weakness no focal deficits, in the setting of COVID-19 infection.  No headache, pupils equal.  Not on anticoagulation.  Appears dehydrated. -2 L bolus given, continue N/s 75cc/hr x 20hrs -home meds- Depakote, risperidone, Lexapro, held for now pending med rec

## 2023-08-23 NOTE — ED Triage Notes (Signed)
Home health nurse called ems for pt being altered and running a fever.

## 2023-08-23 NOTE — Assessment & Plan Note (Addendum)
Med reconciliation pending, QTc prolonged.  Med list-patient is on risperidone, Depakote, Lexapro, mirtazapine

## 2023-08-24 DIAGNOSIS — U071 COVID-19: Principal | ICD-10-CM | POA: Diagnosis present

## 2023-08-24 DIAGNOSIS — G8191 Hemiplegia, unspecified affecting right dominant side: Secondary | ICD-10-CM | POA: Diagnosis present

## 2023-08-24 DIAGNOSIS — E1122 Type 2 diabetes mellitus with diabetic chronic kidney disease: Secondary | ICD-10-CM | POA: Diagnosis present

## 2023-08-24 DIAGNOSIS — E860 Dehydration: Secondary | ICD-10-CM | POA: Diagnosis present

## 2023-08-24 DIAGNOSIS — M109 Gout, unspecified: Secondary | ICD-10-CM | POA: Diagnosis present

## 2023-08-24 DIAGNOSIS — E871 Hypo-osmolality and hyponatremia: Secondary | ICD-10-CM | POA: Diagnosis present

## 2023-08-24 DIAGNOSIS — I2510 Atherosclerotic heart disease of native coronary artery without angina pectoris: Secondary | ICD-10-CM | POA: Diagnosis present

## 2023-08-24 DIAGNOSIS — R627 Adult failure to thrive: Secondary | ICD-10-CM | POA: Diagnosis present

## 2023-08-24 DIAGNOSIS — F209 Schizophrenia, unspecified: Secondary | ICD-10-CM | POA: Diagnosis present

## 2023-08-24 DIAGNOSIS — G4733 Obstructive sleep apnea (adult) (pediatric): Secondary | ICD-10-CM | POA: Diagnosis present

## 2023-08-24 DIAGNOSIS — G8929 Other chronic pain: Secondary | ICD-10-CM | POA: Diagnosis present

## 2023-08-24 DIAGNOSIS — E1140 Type 2 diabetes mellitus with diabetic neuropathy, unspecified: Secondary | ICD-10-CM | POA: Diagnosis present

## 2023-08-24 DIAGNOSIS — N179 Acute kidney failure, unspecified: Secondary | ICD-10-CM | POA: Diagnosis present

## 2023-08-24 DIAGNOSIS — G4730 Sleep apnea, unspecified: Secondary | ICD-10-CM | POA: Diagnosis present

## 2023-08-24 DIAGNOSIS — N1832 Chronic kidney disease, stage 3b: Secondary | ICD-10-CM | POA: Diagnosis present

## 2023-08-24 DIAGNOSIS — I129 Hypertensive chronic kidney disease with stage 1 through stage 4 chronic kidney disease, or unspecified chronic kidney disease: Secondary | ICD-10-CM | POA: Diagnosis present

## 2023-08-24 DIAGNOSIS — G4700 Insomnia, unspecified: Secondary | ICD-10-CM | POA: Diagnosis present

## 2023-08-24 DIAGNOSIS — Z981 Arthrodesis status: Secondary | ICD-10-CM | POA: Diagnosis not present

## 2023-08-24 DIAGNOSIS — M5412 Radiculopathy, cervical region: Secondary | ICD-10-CM | POA: Diagnosis present

## 2023-08-24 DIAGNOSIS — Z794 Long term (current) use of insulin: Secondary | ICD-10-CM | POA: Diagnosis not present

## 2023-08-24 DIAGNOSIS — F419 Anxiety disorder, unspecified: Secondary | ICD-10-CM | POA: Diagnosis present

## 2023-08-24 DIAGNOSIS — G9341 Metabolic encephalopathy: Secondary | ICD-10-CM | POA: Diagnosis present

## 2023-08-24 DIAGNOSIS — G40909 Epilepsy, unspecified, not intractable, without status epilepticus: Secondary | ICD-10-CM | POA: Diagnosis present

## 2023-08-24 DIAGNOSIS — F319 Bipolar disorder, unspecified: Secondary | ICD-10-CM | POA: Diagnosis present

## 2023-08-24 LAB — CBC
HCT: 34.4 % — ABNORMAL LOW (ref 39.0–52.0)
Hemoglobin: 11.4 g/dL — ABNORMAL LOW (ref 13.0–17.0)
MCH: 30.2 pg (ref 26.0–34.0)
MCHC: 33.1 g/dL (ref 30.0–36.0)
MCV: 91.2 fL (ref 80.0–100.0)
Platelets: 198 10*3/uL (ref 150–400)
RBC: 3.77 MIL/uL — ABNORMAL LOW (ref 4.22–5.81)
RDW: 14.1 % (ref 11.5–15.5)
WBC: 4.6 10*3/uL (ref 4.0–10.5)
nRBC: 0 % (ref 0.0–0.2)

## 2023-08-24 LAB — GLUCOSE, CAPILLARY
Glucose-Capillary: 135 mg/dL — ABNORMAL HIGH (ref 70–99)
Glucose-Capillary: 61 mg/dL — ABNORMAL LOW (ref 70–99)
Glucose-Capillary: 108 mg/dL — ABNORMAL HIGH (ref 70–99)
Glucose-Capillary: 76 mg/dL (ref 70–99)
Glucose-Capillary: 66 mg/dL — ABNORMAL LOW (ref 70–99)
Glucose-Capillary: 107 mg/dL — ABNORMAL HIGH (ref 70–99)
Glucose-Capillary: 122 mg/dL — ABNORMAL HIGH (ref 70–99)

## 2023-08-24 LAB — BASIC METABOLIC PANEL
Anion gap: 9 (ref 5–15)
BUN: 18 mg/dL (ref 6–20)
CO2: 21 mmol/L — ABNORMAL LOW (ref 22–32)
Calcium: 8.8 mg/dL — ABNORMAL LOW (ref 8.9–10.3)
Chloride: 106 mmol/L (ref 98–111)
Creatinine, Ser: 1.39 mg/dL — ABNORMAL HIGH (ref 0.61–1.24)
GFR, Estimated: 58 mL/min — ABNORMAL LOW (ref >60)
Glucose, Bld: 97 mg/dL (ref 70–99)
Potassium: 3.4 mmol/L — ABNORMAL LOW (ref 3.5–5.1)
Sodium: 136 mmol/L (ref 135–145)

## 2023-08-24 LAB — VALPROIC ACID LEVEL: Valproic Acid Lvl: 52 ug/mL (ref 50.0–100.0)

## 2023-08-24 LAB — HEMOGLOBIN A1C
Hgb A1c MFr Bld: 5.8 % — ABNORMAL HIGH (ref 4.8–5.6)
Mean Plasma Glucose: 119.76 mg/dL

## 2023-08-24 MED ORDER — TICAGRELOR 90 MG PO TABS
90.0000 mg | ORAL_TABLET | Freq: Two times a day (BID) | ORAL | Status: DC
Start: 2023-08-24 — End: 2023-08-26
  Administered 2023-08-24 – 2023-08-26 (×6): 90 mg via ORAL
  Filled 2023-08-24 (×6): qty 1

## 2023-08-24 MED ORDER — POLYETHYLENE GLYCOL 3350 17 G PO PACK: 17.0000 g | PACK | Freq: Every day | ORAL | Status: DC | PRN

## 2023-08-24 MED ORDER — ALLOPURINOL 100 MG PO TABS
100.0000 mg | ORAL_TABLET | Freq: Every day | ORAL | Status: DC
  Administered 2023-08-24 – 2023-08-26 (×3): 100 mg via ORAL
  Filled 2023-08-24 (×3): qty 1

## 2023-08-24 MED ORDER — INSULIN ASPART 100 UNIT/ML IJ SOLN: 0.0000 [IU] | Freq: Every day | INTRAMUSCULAR | Status: DC

## 2023-08-24 MED ORDER — HEPARIN SODIUM (PORCINE) 5000 UNIT/ML IJ SOLN
5000.0000 [IU] | Freq: Three times a day (TID) | INTRAMUSCULAR | Status: DC
  Administered 2023-08-24 – 2023-08-26 (×7): 5000 [IU] via SUBCUTANEOUS
  Filled 2023-08-24 (×7): qty 1

## 2023-08-24 MED ORDER — DIVALPROEX SODIUM 250 MG PO DR TAB
250.0000 mg | DELAYED_RELEASE_TABLET | Freq: Two times a day (BID) | ORAL | Status: DC
Start: 2023-08-24 — End: 2023-08-26
  Administered 2023-08-24 – 2023-08-26 (×5): 250 mg via ORAL
  Filled 2023-08-24 (×5): qty 1

## 2023-08-24 MED ORDER — ACETAMINOPHEN 325 MG PO TABS
650.0000 mg | ORAL_TABLET | Freq: Four times a day (QID) | ORAL | Status: DC | PRN
  Administered 2023-08-24 – 2023-08-26 (×5): 650 mg via ORAL
  Filled 2023-08-24 (×5): qty 2

## 2023-08-24 MED ORDER — LAMOTRIGINE 25 MG PO TABS
50.0000 mg | ORAL_TABLET | Freq: Two times a day (BID) | ORAL | Status: DC
Start: 2023-08-24 — End: 2023-08-26
  Administered 2023-08-24 – 2023-08-26 (×6): 50 mg via ORAL
  Filled 2023-08-24 (×6): qty 2

## 2023-08-24 MED ORDER — DEXTROSE-SODIUM CHLORIDE 5-0.9 % IV SOLN
INTRAVENOUS | Status: DC
Start: 2023-08-24 — End: 2023-08-25

## 2023-08-24 MED ORDER — DEXTROSE 50 % IV SOLN
1.0000 | Freq: Once | INTRAVENOUS | Status: AC
  Administered 2023-08-24: 25 mL via INTRAVENOUS
  Filled 2023-08-24: qty 50

## 2023-08-24 MED ORDER — ACETAMINOPHEN 650 MG RE SUPP: 650.0000 mg | Freq: Four times a day (QID) | RECTAL | Status: DC | PRN

## 2023-08-24 MED ORDER — POTASSIUM CHLORIDE CRYS ER 20 MEQ PO TBCR
40.0000 meq | EXTENDED_RELEASE_TABLET | ORAL | Status: AC
  Administered 2023-08-24 (×2): 40 meq via ORAL
  Filled 2023-08-24: qty 2

## 2023-08-24 MED ORDER — AMLODIPINE BESYLATE 5 MG PO TABS
10.0000 mg | ORAL_TABLET | Freq: Every day | ORAL | Status: DC
  Administered 2023-08-24 – 2023-08-26 (×3): 10 mg via ORAL
  Filled 2023-08-24 (×3): qty 2

## 2023-08-24 MED ORDER — INSULIN ASPART 100 UNIT/ML IJ SOLN
0.0000 [IU] | Freq: Three times a day (TID) | INTRAMUSCULAR | Status: DC
  Administered 2023-08-25: 2 [IU] via SUBCUTANEOUS

## 2023-08-24 NOTE — Progress Notes (Signed)
PROGRESS NOTE  Melvin Taylor, is a 58 y.o. male, DOB - 10-15-1965, ZOX:[id number]  Admit date - 08/23/2023   Admitting Physician Kamori Kitchens Mariea Clonts, MD  Outpatient Primary MD for the patient is [name], MD  LOS - 0  Chief Complaint  Patient presents with   Altered Mental Status      Brief Narrative:  58 y.o. male with medical history significant for diabetes mellitus, gout, hypertension, seizure disorder, CKD 3B, and history of stroke admitted on 08/23/2023 with acute metabolic encephalopathy in the setting of COVID-19 infection    -Assessment and Plan: 1)Acute metabolic encephalopathy Generalized weakness no focal deficits, in the setting of COVID-19 infection and dehydration -Patient has extensive neuro history including history of subdural hematoma, remote infarcts left basal ganglia, anterior left frontal lobe and corona radiata, status post prior left MCA stent placement -Patient does have right-sided weakness which is not new -.  No headache, pupils equal.  Not on anticoagulation.   --Mentation improving but not back to baseline yet -Consider repeat cranial imaging if mentation fails to improve as anticipated -Continue PTA Lamictal and Depakote  2)AKI (acute kidney injury) (HCC) Creatinine elevated 1.6, baseline 1.1-1.2.  Likely secondary to dehydration from viral infection. -Creatinine improving with hydration - renally adjust medications, avoid nephrotoxic agents / dehydration  / hypotension  3)COVID-19 virus infection -SIRS pathophysiology resolving -Chest x-ray without acute findings -No hypoxia -c/n  Paxlovid  4)Seizure disorder (HCC) - -Serum Depakote level was therapeutic at 52 C/n Depakote and Lamictal  5)DM2- .-A1C 5.8 reflecting excellent diabetic control PTA Use Novolog/Humalog Sliding scale insulin with Accu-Cheks/Fingersticks as ordered  6)Gout Resume allopurinol  7)H/o Left middle cerebral artery stroke (HCC) Resume  Brilinta  8)HTN--amlodipine as ordered  Status is: Inpatient   Disposition: The patient is from: Home              Anticipated d/c is to: Home              Anticipated d/c date is: 1 day              Patient currently is not medically stable to d/c. Barriers: Not Clinically Stable-   Code Status :  -  Code Status: Full Code   Family Communication:   Sister [name] is  primary contact  DVT Prophylaxis  :   - SCDs  heparin injection 5,000 Units Start: 08/24/23 0600   Lab Results  Component Value Date   PLT 198 08/24/2023    Inpatient Medications  Scheduled Meds:  allopurinol  100 mg Oral Daily   divalproex  250 mg Oral Q12H   heparin  5,000 Units Subcutaneous Q8H   insulin aspart  0-5 Units Subcutaneous QHS   insulin aspart  0-9 Units Subcutaneous TID WC   lamoTRIgine  50 mg Oral BID   nirmatrelvir/ritonavir (renal dosing)  2 tablet Oral BID   ticagrelor  90 mg Oral BID   Continuous Infusions: PRN Meds:.acetaminophen **OR** acetaminophen, polyethylene glycol   Anti-infectives (From admission, onward)    Start     Dose/Rate Route Frequency Ordered Stop   08/23/23 2345  nirmatrelvir/ritonavir (renal dosing) (PAXLOVID) 2 tablet        2 tablet Oral 2 times daily 08/23/23 2331 08/28/23 2159   08/23/23 1830  cefTRIAXone (ROCEPHIN) 2 g in sodium chloride 0.9 % 100 mL IVPB        2 g 200 mL/hr over 30 Minutes Intravenous Once 08/23/23 1826 08/23/23 1928   08/23/23 1830  azithromycin (  ZITHROMAX) 500 mg in sodium chloride 0.9 % 250 mL IVPB        500 mg 250 mL/hr over 60 Minutes Intravenous  Once 08/23/23 1826 08/23/23 2028         Subjective: [name] today has no fevers, no emesis,  No chest pain,   - Resting comfortably -Interacting -Appetite is not great   Objective: Vitals:   08/24/23 0019 08/24/23 0149 08/24/23 0154 08/24/23 0435  BP:   (!) 150/106 (!) 145/75  Pulse:   100 89  Resp: 18  (!) 22 18  Temp:   98.2 F (36.8 C) 98.8 F (37.1 C)   TempSrc:    Oral  SpO2: 99%  100% 100%  Weight:  58.4 kg    Height:        Intake/Output Summary (Last 24 hours) at 08/24/2023 1817 Last data filed at 08/24/2023 1700 Gross per 24 hour  Intake 770 ml  Output 650 ml  Net 120 ml   Filed Weights   08/23/23 1815 08/24/23 0149  Weight: 62 kg 58.4 kg    Physical Exam  Gen:- Awake Alert, no acute distress HEENT:- [place].AT, No sclera icterus Neck-Supple Neck,No JVD,.  Lungs-  CTAB , fair symmetrical air movement CV- S1, S2 normal, regular  Abd-  +ve B.Sounds, Abd Soft, No tenderness,    Extremity/Skin:- No  edema, pedal pulses present  Psych-affect is appropriate, oriented x 2 Neuro-generalized weakness, residual right hemiparesis, no additional new focal deficits, no tremors  Data Reviewed: I have personally reviewed following labs and imaging studies  CBC: Recent Labs  Lab 08/18/23 0948 08/23/23 1840 08/24/23 0459  WBC 4.7 4.7 4.6  NEUTROABS  --  2.4  --   HGB 10.4* 11.5* 11.4*  HCT 31.6* 35.1* 34.4*  MCV 93.2 92.4 91.2  PLT 176 214 198   Basic Metabolic Panel: Recent Labs  Lab 08/23/23 1840 08/24/23 0459  NA 134* 136  K 3.8 3.4*  CL 101 106  CO2 23 21*  GLUCOSE 101* 97  BUN 19 18  CREATININE 1.66* 1.39*  CALCIUM 9.0 8.8*   GFR: Estimated Creatinine Clearance: 47.3 mL/min (A) (by C-G formula based on SCr of 1.39 mg/dL (H)). Liver Function Tests: Recent Labs  Lab 08/23/23 1840  AST 30  ALT 19  ALKPHOS 48  BILITOT 1.1  PROT 6.6  ALBUMIN 3.9   HbA1C: Recent Labs    08/23/23 1840  HGBA1C 5.8*   Recent Results (from the past 240 hours)  Blood Culture (routine x 2)     Status: None (Preliminary result)   Collection Time: 08/23/23  6:40 PM   Specimen: Right Antecubital; Blood  Result Value Ref Range Status   Specimen Description RIGHT ANTECUBITAL  Final   Special Requests   Final    BOTTLES DRAWN AEROBIC AND ANAEROBIC Blood Culture adequate volume   Culture   Final    NO GROWTH < 12  HOURS Performed at [facility], [address], [place], [place] [address]    Report Status PENDING  Incomplete  Blood Culture (routine x 2)     Status: None (Preliminary result)   Collection Time: 08/23/23  6:50 PM   Specimen: Left Antecubital; Blood  Result Value Ref Range Status   Specimen Description LEFT ANTECUBITAL  Final   Special Requests   Final    BOTTLES DRAWN AEROBIC AND ANAEROBIC Blood Culture adequate volume   Culture   Final    NO GROWTH < 12 HOURS Performed at  [facility], [address], [place], [place] [address]    Report Status PENDING  Incomplete  Resp panel by RT-PCR (RSV, Flu A&B, Covid) Anterior Nasal Swab     Status: Abnormal   Collection Time: 08/23/23  6:57 PM   Specimen: Anterior Nasal Swab  Result Value Ref Range Status   SARS Coronavirus 2 by RT PCR POSITIVE (A) NEGATIVE Final    Comment: (NOTE) SARS-CoV-2 target nucleic acids are DETECTED.  The SARS-CoV-2 RNA is generally detectable in upper respiratory specimens during the acute phase of infection. Positive results are indicative of the presence of the identified virus, but do not rule out bacterial infection or co-infection with other pathogens not detected by the test. Clinical correlation with patient history and other diagnostic information is necessary to determine patient infection status. The expected result is Negative.  Fact Sheet for Patients: BloggerCourse.com  Fact Sheet for Healthcare Providers: SeriousBroker.it  This test is not yet approved or cleared by the [place] FDA and  has been authorized for detection and/or diagnosis of SARS-CoV-2 by FDA under an Emergency Use Authorization (EUA).  This EUA will remain in effect (meaning this test can be used) for the duration of  the COVID-19 declaration under Section 564(b)(1) of the A ct, 21 U.S.C. section 360bbb-3(b)(1), unless the authorization is terminated or revoked  sooner.     Influenza A by PCR NEGATIVE NEGATIVE Final   Influenza B by PCR NEGATIVE NEGATIVE Final    Comment: (NOTE) The Xpert Xpress SARS-CoV-2/FLU/RSV plus assay is intended as an aid in the diagnosis of influenza from Nasopharyngeal swab specimens and should not be used as a sole basis for treatment. Nasal washings and aspirates are unacceptable for Xpert Xpress SARS-CoV-2/FLU/RSV testing.  Fact Sheet for Patients: BloggerCourse.com  Fact Sheet for Healthcare Providers: SeriousBroker.it  This test is not yet approved or cleared by the [place] FDA and has been authorized for detection and/or diagnosis of SARS-CoV-2 by FDA under an Emergency Use Authorization (EUA). This EUA will remain in effect (meaning this test can be used) for the duration of the COVID-19 declaration under Section 564(b)(1) of the Act, 21 U.S.C. section 360bbb-3(b)(1), unless the authorization is terminated or revoked.     Resp Syncytial Virus by PCR NEGATIVE NEGATIVE Final    Comment: (NOTE) Fact Sheet for Patients: BloggerCourse.com  Fact Sheet for Healthcare Providers: SeriousBroker.it  This test is not yet approved or cleared by the [place] FDA and has been authorized for detection and/or diagnosis of SARS-CoV-2 by FDA under an Emergency Use Authorization (EUA). This EUA will remain in effect (meaning this test can be used) for the duration of the COVID-19 declaration under Section 564(b)(1) of the Act, 21 U.S.C. section 360bbb-3(b)(1), unless the authorization is terminated or revoked.  Performed at [facility], [address], [place], [place] [address]     Radiology Studies: [facility] Chest [facility] 1 View Result Date: 08/23/2023 CLINICAL DATA:  Altered level of consciousness, fever, questionable sepsis EXAM: PORTABLE CHEST 1 VIEW COMPARISON:  12/16/2022 FINDINGS: Single frontal view of the  chest demonstrates postsurgical changes from CABG. No acute airspace disease, effusion, or pneumothorax. No acute bony abnormalities. IMPRESSION: 1. No acute intrathoracic process. Electronically Signed   By: Sharlet Salina M.D.   On: 08/23/2023 18:50   Scheduled Meds:  allopurinol  100 mg Oral Daily   divalproex  250 mg Oral Q12H   heparin  5,000 Units Subcutaneous Q8H   insulin aspart  0-5 Units Subcutaneous QHS  insulin aspart  0-9 Units Subcutaneous TID WC   lamoTRIgine  50 mg Oral BID   nirmatrelvir/ritonavir (renal dosing)  2 tablet Oral BID   ticagrelor  90 mg Oral BID   Continuous Infusions:   LOS: 0 days    Shon Hale M.D on 08/24/2023 at 6:17 PM  Go to [email or web address] - for contact info  [facility] - Office  [phone]  If 7PM-7AM, please contact night-coverage [email or web address] 08/24/2023, 6:17 PM

## 2023-08-24 NOTE — Progress Notes (Signed)
   08/24/23 0910  TOC Brief Assessment  Insurance and Status Reviewed  Patient has primary care physician Yes  Home environment has been reviewed from home with family  Prior/Current Home Services Current home services [facility] RN, aide)  Social Drivers of Health Review SDOH reviewed no interventions necessary  Readmission risk has been reviewed Yes  Transition of care needs no transition of care needs at this time    Transition of Care Department [facility]) has reviewed patient and no TOC needs have been identified at this time. We will continue to monitor patient advancement through interdisciplinary progression rounds. If new patient transition needs arise, please place a TOC consult.

## 2023-08-25 DIAGNOSIS — G9341 Metabolic encephalopathy: Secondary | ICD-10-CM | POA: Diagnosis not present

## 2023-08-25 LAB — RENAL FUNCTION PANEL
Albumin: 3.3 g/dL — ABNORMAL LOW (ref 3.5–5.0)
Anion gap: 6 (ref 5–15)
BUN: 15 mg/dL (ref 6–20)
CO2: 21 mmol/L — ABNORMAL LOW (ref 22–32)
Calcium: 8.3 mg/dL — ABNORMAL LOW (ref 8.9–10.3)
Chloride: 104 mmol/L (ref 98–111)
Creatinine, Ser: 1.2 mg/dL (ref 0.61–1.24)
GFR, Estimated: >60 mL/min (ref >60)
Glucose, Bld: 100 mg/dL — ABNORMAL HIGH (ref 70–99)
Phosphorus: 3 mg/dL (ref 2.5–4.6)
Potassium: 4 mmol/L (ref 3.5–5.1)
Sodium: 131 mmol/L — ABNORMAL LOW (ref 135–145)

## 2023-08-25 LAB — GLUCOSE, CAPILLARY
Glucose-Capillary: 98 mg/dL (ref 70–99)
Glucose-Capillary: 185 mg/dL — ABNORMAL HIGH (ref 70–99)
Glucose-Capillary: 109 mg/dL — ABNORMAL HIGH (ref 70–99)

## 2023-08-25 NOTE — Progress Notes (Signed)
PROGRESS NOTE  Melvin Taylor, is a 58 y.o. male, DOB - [birth year], ZOX:[id number]  Admit date - 08/23/2023   Admitting Physician Alphonzo Devera Mariea Clonts, MD  Outpatient Primary MD for the patient is [name], MD  LOS - 1  Chief Complaint  Patient presents with   Altered Mental Status      Brief Narrative:  58 y.o. male with medical history significant for diabetes mellitus, gout, hypertension, seizure disorder, CKD 3B, and history of stroke admitted on 08/23/2023 with acute metabolic encephalopathy in the setting of COVID-19 infection    -Assessment and Plan: 1)Acute metabolic encephalopathy Generalized weakness no focal deficits, in the setting of COVID-19 infection and dehydration -Patient has extensive neuro history including history of subdural hematoma, remote infarcts left basal ganglia, anterior left frontal lobe and corona radiata, status post prior left MCA stent placement -Patient does have right-sided weakness which is not new -.  No headache, pupils equal.  Not on anticoagulation. 08/25/23   --Mentation improving but not back to baseline yet -Consider repeat cranial imaging if mentation fails to improve as anticipated -Continue PTA Lamictal and Depakote  2)AKI (acute kidney injury) (HCC) Creatinine elevated 1.6, baseline 1.1-1.2.  Likely secondary to dehydration from viral infection. 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 Barriers: Not Clinically Stable-   Code Status :  -  Code Status: Full Code   Family Communication:   Sister [name] is  primary contact  DVT Prophylaxis  :   - SCDs  heparin injection 5,000 Units Start: 08/24/23 0600   Lab Results  Component Value Date   PLT 198 08/24/2023    Inpatient Medications  Scheduled Meds:  allopurinol  100 mg Oral Daily   amLODipine  10 mg Oral Daily   divalproex  250 mg Oral Q12H   heparin  5,000 Units Subcutaneous Q8H   insulin aspart  0-5 Units Subcutaneous QHS   insulin aspart  0-9 Units Subcutaneous TID WC   lamoTRIgine  50 mg Oral BID   nirmatrelvir/ritonavir (renal dosing)  2 tablet Oral BID   ticagrelor  90 mg Oral BID   Continuous Infusions: PRN Meds:.acetaminophen **OR** acetaminophen, polyethylene glycol   Anti-infectives (From admission, onward)    Start     Dose/Rate Route Frequency Ordered Stop   08/23/23 2345  nirmatrelvir/ritonavir (renal dosing) (PAXLOVID) 2 tablet        2 tablet Oral 2 times daily 08/23/23 2331 08/28/23 2159   08/23/23 1830  cefTRIAXone (ROCEPHIN) 2 g in sodium chloride 0.9 % 100 mL IVPB        2 g  200 mL/hr over 30 Minutes Intravenous Once 08/23/23 1826 08/23/23 1928   08/23/23 1830  azithromycin (ZITHROMAX) 500 mg in sodium chloride 0.9 % 250 mL IVPB        500 mg 250 mL/hr over 60 Minutes Intravenous  Once 08/23/23 1826 08/23/23 2028       Subjective: Melvin Taylor today has no fevers, no emesis,  No chest pain,   - -mentation improving but not back to baseline -Oral intake is fair -Cough is not productive  Objective: Vitals:   08/25/23 0338 08/25/23 0353 08/25/23 0900 08/25/23 1401  BP:  134/86 (!) 143/80 119/74 132/78  Pulse: 78 76 83 80  Resp: 16   15  Temp: 99.1 F (37.3 C) 98.8 F (37.1 C) 98.6 F (37 C) 98.2 F (36.8 C)  TempSrc: Oral Oral Oral Oral  SpO2: 97% 98% 99% 100%  Weight:      Height:        Intake/Output Summary (Last 24 hours) at 08/25/2023 1847 Last data filed at 08/25/2023 1832 Gross per 24 hour  Intake 2080 ml  Output 2650 ml  Net -570 ml   Filed Weights   08/23/23 1815 08/24/23 0149  Weight: 62 kg 58.4 kg    Physical Exam  Gen:- Awake Alert, no acute distress HEENT:- [place].AT, No sclera icterus Neck-Supple Neck,No JVD,.  Lungs-  CTAB , fair symmetrical air movement CV- S1, S2 normal, regular  Abd-  +ve B.Sounds, Abd Soft, No tenderness,    Extremity/Skin:- No  edema, pedal pulses present  Psych-affect is appropriate, oriented x 2, mentation improving but not back to baseline Neuro-generalized weakness, residual right hemiparesis, no additional new focal deficits, no tremors  Data Reviewed: I have personally reviewed following labs and imaging studies  CBC: Recent Labs  Lab 08/23/23 1840 08/24/23 0459  WBC 4.7 4.6  NEUTROABS 2.4  --   HGB 11.5* 11.4*  HCT 35.1* 34.4*  MCV 92.4 91.2  PLT 214 198   Basic Metabolic Panel: Recent Labs  Lab 08/23/23 1840 08/24/23 0459 08/25/23 0420  NA 134* 136 131*  K 3.8 3.4* 4.0  CL 101 106 104  CO2 23 21* 21*  GLUCOSE 101* 97 100*  BUN 19 18 15   CREATININE 1.66* 1.39* 1.20  CALCIUM 9.0 8.8* 8.3*  PHOS  --   --  3.0   GFR: Estimated Creatinine Clearance: 54.8 mL/min (by C-G formula based on SCr of 1.2 mg/dL). Liver Function Tests: Recent Labs  Lab 08/23/23 1840 08/25/23 0420  AST 30  --   ALT 19  --   ALKPHOS 48  --   BILITOT 1.1  --   PROT 6.6  --   ALBUMIN 3.9 3.3*   HbA1C: Recent Labs    08/23/23 1840  HGBA1C 5.8*   Recent Results (from the past 240 hours)  Blood Culture (routine x 2)     Status: None (Preliminary result)   Collection Time: 08/23/23  6:40 PM    Specimen: Right Antecubital; Blood  Result Value Ref Range Status   Specimen Description RIGHT ANTECUBITAL  Final   Special Requests   Final    BOTTLES DRAWN AEROBIC AND ANAEROBIC Blood Culture adequate volume   Culture   Final    NO GROWTH 2 DAYS Performed at [facility], [address], [place], [place] [address]    Report Status PENDING  Incomplete  Blood Culture (routine x 2)     Status: None (Preliminary result)   Collection Time: 08/23/23  6:50  PM   Specimen: Left Antecubital; Blood  Result Value Ref Range Status   Specimen Description LEFT ANTECUBITAL  Final   Special Requests   Final    BOTTLES DRAWN AEROBIC AND ANAEROBIC Blood Culture adequate volume   Culture   Final    NO GROWTH 2 DAYS Performed at [facility], [address] [place], [place] [address]    Report Status PENDING  Incomplete  Resp panel by RT-PCR (RSV, Flu A&B, Covid) Anterior Nasal Swab     Status: Abnormal   Collection Time: 08/23/23  6:57 PM   Specimen: Anterior Nasal Swab  Result Value Ref Range Status   SARS Coronavirus 2 by RT PCR POSITIVE (A) NEGATIVE Final    Comment: (NOTE) SARS-CoV-2 target nucleic acids are DETECTED.  The SARS-CoV-2 RNA is generally detectable in upper respiratory specimens during the acute phase of infection. Positive results are indicative of the presence of the identified virus, but do not rule out bacterial infection or co-infection with other pathogens not detected by the test. Clinical correlation with patient history and other diagnostic information is necessary to determine patient infection status. The expected result is Negative.  Fact Sheet for Patients: BloggerCourse.com  Fact Sheet for Healthcare Providers: SeriousBroker.it  This test is not yet approved or cleared by the [place] FDA and  has been authorized for detection and/or diagnosis of SARS-CoV-2 by FDA under an Emergency Use Authorization  (EUA).  This EUA will remain in effect (meaning this test can be used) for the duration of  the COVID-19 declaration under Section 564(b)(1) of the A ct, 21 U.S.C. section 360bbb-3(b)(1), unless the authorization is terminated or revoked sooner.     Influenza A by PCR NEGATIVE NEGATIVE Final   Influenza B by PCR NEGATIVE NEGATIVE Final    Comment: (NOTE) The Xpert Xpress SARS-CoV-2/FLU/RSV plus assay is intended as an aid in the diagnosis of influenza from Nasopharyngeal swab specimens and should not be used as a sole basis for treatment. Nasal washings and aspirates are unacceptable for Xpert Xpress SARS-CoV-2/FLU/RSV testing.  Fact Sheet for Patients: BloggerCourse.com  Fact Sheet for Healthcare Providers: SeriousBroker.it  This test is not yet approved or cleared by the [place] FDA and has been authorized for detection and/or diagnosis of SARS-CoV-2 by FDA under an Emergency Use Authorization (EUA). This EUA will remain in effect (meaning this test can be used) for the duration of the COVID-19 declaration under Section 564(b)(1) of the Act, 21 U.S.C. section 360bbb-3(b)(1), unless the authorization is terminated or revoked.     Resp Syncytial Virus by PCR NEGATIVE NEGATIVE Final    Comment: (NOTE) Fact Sheet for Patients: BloggerCourse.com  Fact Sheet for Healthcare Providers: SeriousBroker.it  This test is not yet approved or cleared by the [place] FDA and has been authorized for detection and/or diagnosis of SARS-CoV-2 by FDA under an Emergency Use Authorization (EUA). This EUA will remain in effect (meaning this test can be used) for the duration of the COVID-19 declaration under Section 564(b)(1) of the Act, 21 U.S.C. section 360bbb-3(b)(1), unless the authorization is terminated or revoked.  Performed at [facility], [address], [place], [place]  [address]     Scheduled Meds:  allopurinol  100 mg Oral Daily   amLODipine  10 mg Oral Daily   divalproex  250 mg Oral Q12H   heparin  5,000 Units Subcutaneous Q8H   insulin aspart  0-5 Units Subcutaneous QHS   insulin aspart  0-9 Units Subcutaneous TID WC  lamoTRIgine  50 mg Oral BID   nirmatrelvir/ritonavir (renal dosing)  2 tablet Oral BID   ticagrelor  90 mg Oral BID   Continuous Infusions:   LOS: 1 day   Shon Hale M.D on 08/25/2023 at 6:47 PM  Go to [email or web address] - for contact info  [facility] - Office  [phone]  If 7PM-7AM, please contact night-coverage [email or web address] 08/25/2023, 6:47 PM

## 2023-08-25 NOTE — Plan of Care (Signed)
  Problem: Education: Goal: Knowledge of risk factors and measures for prevention of condition will improve Outcome: Progressing   Problem: Coping: Goal: Psychosocial and spiritual needs will be supported Outcome: Progressing   Problem: Respiratory: Goal: Will maintain a patent airway Outcome: Progressing Goal: Complications related to the disease process, condition or treatment will be avoided or minimized Outcome: Progressing   Problem: Education: Goal: Knowledge of General Education information will improve Description: Including pain rating scale, medication(s)/side effects and non-pharmacologic comfort measures Outcome: Progressing   Problem: Health Behavior/Discharge Planning: Goal: Ability to manage health-related needs will improve Outcome: Progressing   Problem: Clinical Measurements: Goal: Ability to maintain clinical measurements within normal limits will improve Outcome: Progressing Goal: Will remain free from infection Outcome: Progressing Goal: Diagnostic test results will improve Outcome: Progressing Goal: Respiratory complications will improve Outcome: Progressing Goal: Cardiovascular complication will be avoided Outcome: Progressing   Problem: Activity: Goal: Risk for activity intolerance will decrease Outcome: Progressing   Problem: Nutrition: Goal: Adequate nutrition will be maintained Outcome: Progressing   Problem: Coping: Goal: Level of anxiety will decrease Outcome: Progressing   Problem: Elimination: Goal: Will not experience complications related to bowel motility Outcome: Progressing Goal: Will not experience complications related to urinary retention Outcome: Progressing   Problem: Pain Managment: Goal: General experience of comfort will improve and/or be controlled Outcome: Progressing   Problem: Safety: Goal: Ability to remain free from injury will improve Outcome: Progressing   Problem: Skin Integrity: Goal: Risk for impaired  skin integrity will decrease Outcome: Progressing   Problem: Education: Goal: Ability to describe self-care measures that may prevent or decrease complications (Diabetes Survival Skills Education) will improve Outcome: Progressing Goal: Individualized Educational Video(s) Outcome: Progressing   Problem: Coping: Goal: Ability to adjust to condition or change in health will improve Outcome: Progressing   Problem: Fluid Volume: Goal: Ability to maintain a balanced intake and output will improve Outcome: Progressing   Problem: Health Behavior/Discharge Planning: Goal: Ability to identify and utilize available resources and services will improve Outcome: Progressing Goal: Ability to manage health-related needs will improve Outcome: Progressing   Problem: Metabolic: Goal: Ability to maintain appropriate glucose levels will improve Outcome: Progressing   Problem: Nutritional: Goal: Maintenance of adequate nutrition will improve Outcome: Progressing Goal: Progress toward achieving an optimal weight will improve Outcome: Progressing   Problem: Skin Integrity: Goal: Risk for impaired skin integrity will decrease Outcome: Progressing   Problem: Tissue Perfusion: Goal: Adequacy of tissue perfusion will improve Outcome: Progressing

## 2023-08-26 DIAGNOSIS — G9341 Metabolic encephalopathy: Secondary | ICD-10-CM | POA: Diagnosis not present

## 2023-08-26 LAB — BASIC METABOLIC PANEL
Anion gap: 8 (ref 5–15)
BUN: 19 mg/dL (ref 6–20)
CO2: 21 mmol/L — ABNORMAL LOW (ref 22–32)
Calcium: 8.5 mg/dL — ABNORMAL LOW (ref 8.9–10.3)
Chloride: 106 mmol/L (ref 98–111)
Creatinine, Ser: 1.5 mg/dL — ABNORMAL HIGH (ref 0.61–1.24)
GFR, Estimated: 53 mL/min — ABNORMAL LOW (ref >60)
Glucose, Bld: 85 mg/dL (ref 70–99)
Potassium: 4.3 mmol/L (ref 3.5–5.1)
Sodium: 135 mmol/L (ref 135–145)

## 2023-08-26 LAB — GLUCOSE, CAPILLARY
Glucose-Capillary: 72 mg/dL (ref 70–99)
Glucose-Capillary: 93 mg/dL (ref 70–99)
Glucose-Capillary: 79 mg/dL (ref 70–99)

## 2023-08-26 MED ORDER — NIRMATRELVIR/RITONAVIR (PAXLOVID) TABLET (RENAL DOSING): 2.0000 | ORAL_TABLET | Freq: Two times a day (BID) | ORAL | Status: AC

## 2023-08-26 MED ORDER — FERROUS SULFATE 325 (65 FE) MG PO TABS: 325.0000 mg | ORAL_TABLET | Freq: Every day | ORAL | 3 refills | Status: AC

## 2023-08-26 MED ORDER — ACETAMINOPHEN 325 MG PO TABS: 650.0000 mg | ORAL_TABLET | Freq: Four times a day (QID) | ORAL | Status: AC | PRN

## 2023-08-26 NOTE — Discharge Instructions (Signed)
1)Avoid ibuprofen/Advil/Aleve/Motrin/Goody Powders/Naproxen/BC powders/Meloxicam/Diclofenac/Indomethacin and other Nonsteroidal anti-inflammatory medications as these will make you more likely to bleed and can cause stomach ulcers, can also cause Kidney problems.   2)Repeat CBC and BMP--in 1 week-  3)You are strongly advised to isolate/quarantine for at least 10 days from the date of your diagnosis with COVID-19 infection--please always wear a mask if you have to go outside the house  4)Please take medications as prescribed--- please note that there were some changes to your medications

## 2023-08-26 NOTE — Discharge Summary (Signed)
Melvin Taylor, is a 58 y.o. male  DOB May 12, 1966  MRN [id number].  Admission date:  08/23/2023  Admitting Physician  Shon Hale, MD  Discharge Date:  08/26/2023   Primary MD  [name], MD  Recommendations for primary care physician for things to follow:  1)Avoid ibuprofen/Advil/Aleve/Motrin/Goody Powders/Naproxen/BC powders/Meloxicam/Diclofenac/Indomethacin and other Nonsteroidal anti-inflammatory medications as these will make you more likely to bleed and can cause stomach ulcers, can also cause Kidney problems.   2)Repeat CBC and BMP--in 1 week-  3)You are strongly advised to isolate/quarantine for at least 10 days from the date of your diagnosis with COVID-19 infection--please always wear a mask if you have to go outside the house  4)Please take medications as prescribed--- please note that there were some changes to your medications  Admission Diagnosis  Acute metabolic encephalopathy [G93.41] COVID-19 [U07.1]   Discharge Diagnosis  Acute metabolic encephalopathy [G93.41] COVID-19 [U07.1]    Principal Problem:   Acute metabolic encephalopathy Active Problems:   COVID-19 virus infection   AKI (acute kidney injury) (HCC)   Seizure disorder (HCC)   Primary hypertension   Anxiety and depression   Diabetes mellitus (HCC)   Left middle cerebral artery stroke (HCC)   OSA (obstructive sleep apnea)   Gout   COVID-19     Past Medical History:  Diagnosis Date   Bipolar 1 disorder (HCC)    depression/anxiety   Bone spur    left heel   Cervical radiculopathy    Chronic back pain    Chronic chest wall pain    Chronic neck pain    Coronary artery disease    a. s/p CABG in 03/2016 with LIMA-LAD, SVG-D1, SVG-RCA, and Seq SVG-mid and distal OM   Diabetes mellitus    Type II   Hallucinations    "long history of them"   Headache(784.0)    History of gout    HTN (hypertension)     Insomnia    Neuropathy    Pain management    Polysubstance abuse (HCC)    Right leg pain    chronic   Schizophrenia (HCC)    Seizures (HCC)    last sz between July 5-9th, 2016; epilepsy   Sleep apnea    Stroke (HCC)    Transfusion of blood product refused for religious reason     Past Surgical History:  Procedure Laterality Date   ANTERIOR CERVICAL DECOMP/DISCECTOMY FUSION N/A 12/14/2015   Procedure: ANTERIOR CERVICAL DECOMPRESSION/DISCECTOMY FUSION CERVICAL FIVE -SIX;  Surgeon: Julio Sicks, MD;  Location: [facility];  Service: Neurosurgery;  Laterality: N/A;   BIOPSY  05/07/2015   Procedure: BIOPSY (Gastric);  Surgeon: Corbin Ade, MD;  Location: [facility] ORS;  Service: Endoscopy;;   CARDIAC CATHETERIZATION N/A 03/07/2016   Procedure: Left Heart Cath and Coronary Angiography;  Surgeon: Lyn Records, MD;  Location: [facility] INVASIVE CV LAB;  Service: Cardiovascular;  Laterality: N/A;   COLONOSCOPY WITH PROPOFOL N/A 05/07/2015   YNW:GNFAOZHYQM diverticulosis, multiple colon polyps removed, tubular adenoma, serrated colon polyp. Next  colonoscopy October 2019   COLONOSCOPY WITH PROPOFOL N/A 08/02/2018   Procedure: COLONOSCOPY WITH PROPOFOL;  Surgeon: Corbin Ade, MD;  Location: [facility];  Service: Endoscopy;  Laterality: N/A;  12:00pm   CORONARY ARTERY BYPASS GRAFT N/A 03/28/2016   Procedure: CORONARY ARTERY BYPASS GRAFTING (CABG) x 5 USING GREATER SAPHENOUS VEIN;  Surgeon: Alleen Borne, MD;  Location: [facility] OR;  Service: Open Heart Surgery;  Laterality: N/A;   ENDOVEIN HARVEST OF GREATER SAPHENOUS VEIN Right 03/28/2016   Procedure: ENDOVEIN HARVEST OF GREATER SAPHENOUS VEIN;  Surgeon: Alleen Borne, MD;  Location: [facility] OR;  Service: Open Heart Surgery;  Laterality: Right;   ESOPHAGEAL DILATION N/A 05/07/2015   Procedure: ESOPHAGEAL DILATION WITH 56FR MALONEY DILATOR;  Surgeon: Corbin Ade, MD;  Location: [facility];  Service: Endoscopy;  Laterality: N/A;   ESOPHAGOGASTRODUODENOSCOPY (EGD) WITH  PROPOFOL N/A 05/07/2015   RMR: Status post dilation of normal esophagus. Gastritis.   IR CT HEAD LTD  12/17/2020   IR CT HEAD LTD  12/17/2020   IR INTRA CRAN STENT  12/17/2020   IR PERCUTANEOUS ART THROMBECTOMY/INFUSION INTRACRANIAL INC DIAG ANGIO  12/17/2020   IR RADIOLOGIST EVAL & MGMT  03/25/2021   KNEE SURGERY Left    arthroscopy   MANDIBLE FRACTURE SURGERY     POLYPECTOMY  05/07/2015   Procedure: POLYPECTOMY (Hepatic Flexure, Distal Transverse Colon, Rectal);  Surgeon: Corbin Ade, MD;  Location: [facility];  Service: Endoscopy;;   RADIOLOGY WITH ANESTHESIA N/A 12/16/2020   Procedure: IR WITH ANESTHESIA;  Surgeon: Julieanne Cotton, MD;  Location[facility] OR;  Service: Radiology;  Laterality: N/A;   TEE WITHOUT CARDIOVERSION N/A 03/28/2016   Procedure: TRANSESOPHAGEAL ECHOCARDIOGRAM (TEE);  Surgeon: Alleen Borne, MD;  Location: [facility] OR;  Service: Open Heart Surgery;  Laterality: N/A;   TONSILLECTOMY     TRIGGER FINGER RELEASE Left 10/15/2021   Procedure: RELEASE TRIGGER FINGER/A-1 PULLEY left middle or long finger;  Surgeon: Vickki Hearing, MD;  Location: [facility] ORS;  Service: Orthopedics;  Laterality: Left;    HPI  from the history and physical done on the day of admission:      Chief Complaint: AMS, fever   HPI: Melvin Taylor is a 58 y.o. male with medical history significant for diabetes mellitus, gout, hypertension, seizure disorder, CKD 3B, stroke. Patient was brought to the ED with reports of confusion, fever, generalized weakness.  Home health nurse came to see patient and called EMS.  My evaluation, patient appears generally weak, slightly lethargic, opens his eyes and answers questions.  He denies difficulty breathing, no cough.  No vomiting no diarrhea no abdominal pain, no urinary symptoms.   ED Course: Tmax 103, maximum temperature 103, Tmax 103, heart rate 85-102, respiratory rate 19-27.  Blood pressure systolic 120s to 409W, O2 sats greater than 97% on room air. COVID test  positive. UA with rare bacteria, not suggestive of infection. Chest x-ray negative for acute abnormality. Lactic acid 1.9. IV ceftriaxone and azithromycin started.  2 L bolus given. Hospitalist admit for AKI, encephalopathy, and generalized weakness.   Review of Systems: As per HPI all other systems reviewed and negative.    Hospital Course:   Brief Narrative:  58 y.o. male with medical history significant for diabetes mellitus, gout, hypertension, seizure disorder, CKD 3B, and history of stroke admitted on 08/23/2023 with acute metabolic encephalopathy in the setting of COVID-19 infection     -Assessment and Plan: 1)Acute metabolic encephalopathy--- resolved Generalized weakness no focal  deficits, in the setting of COVID-19 infection and dehydration -Patient has extensive neuro history including history of subdural hematoma, remote infarcts left basal ganglia, anterior left frontal lobe and corona radiata, status post prior left MCA stent placement -Patient does have right-sided weakness which is not new -.  No headache, pupils equal.  Not on anticoagulation. --Mentation is  back to baseline family/caregiver -Continue PTA Lamictal and Depakote   2)AKI (acute kidney injury) (HCC) Creatinine elevated 1.6, baseline 1.1-1.2.  Likely secondary to dehydration from viral infection. --Avoid dehydration--push fluids - renally adjust medications, avoid nephrotoxic agents / dehydration  / hypotension   3)COVID-19 virus infection -SIRS pathophysiology resolved -Chest x-ray without acute findings -No hypoxia -c/n  Paxlovid   4)Seizure disorder (HCC) - -Serum Depakote level was therapeutic at 52 C/n Depakote and Lamictal   5)DM2- .-A1C 5.8 reflecting excellent diabetic control PTA -Diet controlled   6)Gout Resume allopurinol   7)H/o Left middle cerebral artery stroke (HCC) C/n  Brilinta   8)HTN--isosorbide   9) hyponatremia--- due to dehydration, normalized with IV fluids     Disposition: The patient is from: Home              Anticipated d/c is to: Home with caregivers  Discharge Condition: Stable  Follow UP   Follow-up Information     [name], MD. Schedule an appointment as soon as possible for a visit in 1 week(s).   Specialty: Internal Medicine Why: Repeat CBC and BMP Blood tests Contact information: [address] [place] [address] [phone]                 Diet and Activity recommendation:  As advised  Discharge Instructions    Discharge Instructions     Call MD for:  difficulty breathing, headache or visual disturbances   Complete by: As directed    Call MD for:  persistant dizziness or light-headedness   Complete by: As directed    Call MD for:  persistant nausea and vomiting   Complete by: As directed    Call MD for:  temperature >100.4   Complete by: As directed    Diet - low sodium heart healthy   Complete by: As directed    Discharge instructions   Complete by: As directed    1)Avoid ibuprofen/Advil/Aleve/Motrin/Goody Powders/Naproxen/BC powders/Meloxicam/Diclofenac/Indomethacin and other Nonsteroidal anti-inflammatory medications as these will make you more likely to bleed and can cause stomach ulcers, can also cause Kidney problems.   2)Repeat CBC and BMP--in 1 week-  3)You are strongly advised to isolate/quarantine for at least 10 days from the date of your diagnosis with COVID-19 infection--please always wear a mask if you have to go outside the house  4)Please take medications as prescribed--- please note that there were some changes to your medications   Increase activity slowly   Complete by: As directed          Discharge Medications     Allergies as of 08/26/2023       Reactions   Aspirin Itching, Swelling, Other (See Comments)   Facial swelling        Medication List     PAUSE taking these medications    amLODipine 10 MG tablet Wait to take this until: August 28, 2023  Morning Commonly known as: NORVASC Take 10 mg by mouth daily.       STOP taking these medications    insulin lispro 100 UNIT/ML injection Commonly known as: HUMALOG   lactulose  10 GM/15ML solution Commonly known as: CHRONULAC   Lantus SoloStar 100 UNIT/ML Solostar Pen Generic drug: insulin glargine   mirtazapine 15 MG tablet Commonly known as: REMERON   risperiDONE 2 MG tablet Commonly known as: RISPERDAL       TAKE these medications    Accu-Chek Guide test strip Generic drug: glucose blood USE AS DIRECTED TO MONITOR BLOOD SUGAR (4) TIMES DAILY.   acetaminophen 325 MG tablet Commonly known as: TYLENOL Take 2 tablets (650 mg total) by mouth every 6 (six) hours as needed for mild pain (pain score 1-3) (or Fever >/= 101).   allopurinol 100 MG tablet Commonly known as: ZYLOPRIM TAKE ONE TABLET BY MOUTH EVERY DAY What changed: Another medication with the same name was removed. Continue taking this medication, and follow the directions you see here.   atorvastatin 80 MG tablet Commonly known as: LIPITOR TAKE 1 TABLET BY MOUTH ONCE DAILY.   divalproex 250 MG DR tablet Commonly known as: DEPAKOTE TAKE (1) TABLET BY MOUTH TWICE DAILY.   escitalopram 20 MG tablet Commonly known as: LEXAPRO Take 1 tablet (20 mg total) by mouth daily.   ferrous sulfate 325 (65 FE) MG tablet Take 1 tablet (325 mg total) by mouth daily with breakfast. What changed: when to take this   isosorbide mononitrate 30 MG 24 hr tablet Commonly known as: IMDUR TAKE ONE TABLET BY MOUTH EVERY DAY   lamoTRIgine 25 MG tablet Commonly known as: LAMICTAL TAKE 2 TABLETS BY MOUTH TWICE DAILY.   multivitamin with minerals Tabs tablet Take 1 tablet by mouth daily.   nirmatrelvir/ritonavir (renal dosing) 10 x 150 MG & 10 x 100MG  Tabs Commonly known as: PAXLOVID Take 2 tablets by mouth 2 (two) times daily for 5 days.   ticagrelor 90 MG Tabs tablet Commonly known as: BRILINTA Take 1 tablet (90  mg total) by mouth 2 (two) times daily.   UNABLE TO FIND 1 each by Does not apply route daily. Med Name: GLOVES 1B/M OR PRN   UNABLE TO FIND 1 each by Does not apply route daily. Med Name: PULL UPS 100/M OR PRN   UNABLE TO FIND 1 each by Does not apply route daily. Med Name: NUTRITIONAL SUPPLEMENT 180U/M OR PRN   UNABLE TO FIND 1 each by Does not apply route daily. Med Name: UNDER PADS/CHUX 30/M OR PRN   Vitamin D (Ergocalciferol) 1.25 MG (50000 UNIT) Caps capsule Commonly known as: DRISDOL Take 1 capsule (50,000 Units total) by mouth every 7 (seven) days.   Vitamin D3 50 MCG (2000 UT) capsule Take 1 capsule (2,000 Units total) by mouth daily.        Major procedures and Radiology Reports - PLEASE review detailed and final reports for all details, in brief -   DG Chest Port 1 View Result Date: 08/23/2023 CLINICAL DATA:  Altered level of consciousness, fever, questionable sepsis EXAM: PORTABLE CHEST 1 VIEW COMPARISON:  12/16/2022 FINDINGS: Single frontal view of the chest demonstrates postsurgical changes from CABG. No acute airspace disease, effusion, or pneumothorax. No acute bony abnormalities. IMPRESSION: 1. No acute intrathoracic process. Electronically Signed   By: Sharlet Salina M.D.   On: 08/23/2023 18:50    Micro Results   Recent Results (from the past 240 hours)  Blood Culture (routine x 2)     Status: None (Preliminary result)   Collection Time: 08/23/23  6:40 PM   Specimen: Right Antecubital; Blood  Result Value Ref Range Status   Specimen Description RIGHT ANTECUBITAL  Final   Special Requests   Final    BOTTLES DRAWN AEROBIC AND ANAEROBIC Blood Culture adequate volume   Culture   Final    NO GROWTH 3 DAYS Performed at [facility], [address], [place], [place] [address]    Report Status PENDING  Incomplete  Blood Culture (routine x 2)     Status: None (Preliminary result)   Collection Time: 08/23/23  6:50 PM   Specimen: Left Antecubital; Blood  Result  Value Ref Range Status   Specimen Description LEFT ANTECUBITAL  Final   Special Requests   Final    BOTTLES DRAWN AEROBIC AND ANAEROBIC Blood Culture adequate volume   Culture   Final    NO GROWTH 3 DAYS Performed at [facility], [address], [place], [place] [address]    Report Status PENDING  Incomplete  Resp panel by RT-PCR (RSV, Flu A&B, Covid) Anterior Nasal Swab     Status: Abnormal   Collection Time: 08/23/23  6:57 PM   Specimen: Anterior Nasal Swab  Result Value Ref Range Status   SARS Coronavirus 2 by RT PCR POSITIVE (A) NEGATIVE Final    Comment: (NOTE) SARS-CoV-2 target nucleic acids are DETECTED.  The SARS-CoV-2 RNA is generally detectable in upper respiratory specimens during the acute phase of infection. Positive results are indicative of the presence of the identified virus, but do not rule out bacterial infection or co-infection with other pathogens not detected by the test. Clinical correlation with patient history and other diagnostic information is necessary to determine patient infection status. The expected result is Negative.  Fact Sheet for Patients: BloggerCourse.com  Fact Sheet for Healthcare Providers: SeriousBroker.it  This test is not yet approved or cleared by the [place] FDA and  has been authorized for detection and/or diagnosis of SARS-CoV-2 by FDA under an Emergency Use Authorization (EUA).  This EUA will remain in effect (meaning this test can be used) for the duration of  the COVID-19 declaration under Section 564(b)(1) of the A ct, 21 U.S.C. section 360bbb-3(b)(1), unless the authorization is terminated or revoked sooner.     Influenza A by PCR NEGATIVE NEGATIVE Final   Influenza B by PCR NEGATIVE NEGATIVE Final    Comment: (NOTE) The Xpert Xpress SARS-CoV-2/FLU/RSV plus assay is intended as an aid in the diagnosis of influenza from Nasopharyngeal swab specimens and should not be  used as a sole basis for treatment. Nasal washings and aspirates are unacceptable for Xpert Xpress SARS-CoV-2/FLU/RSV testing.  Fact Sheet for Patients: BloggerCourse.com  Fact Sheet for Healthcare Providers: SeriousBroker.it  This test is not yet approved or cleared by the [place] FDA and has been authorized for detection and/or diagnosis of SARS-CoV-2 by FDA under an Emergency Use Authorization (EUA). This EUA will remain in effect (meaning this test can be used) for the duration of the COVID-19 declaration under Section 564(b)(1) of the Act, 21 U.S.C. section 360bbb-3(b)(1), unless the authorization is terminated or revoked.     Resp Syncytial Virus by PCR NEGATIVE NEGATIVE Final    Comment: (NOTE) Fact Sheet for Patients: BloggerCourse.com  Fact Sheet for Healthcare Providers: SeriousBroker.it  This test is not yet approved or cleared by the [place] FDA and has been authorized for detection and/or diagnosis of SARS-CoV-2 by FDA under an Emergency Use Authorization (EUA). This EUA will remain in effect (meaning this test can be used) for the duration of the COVID-19 declaration under Section 564(b)(1) of the Act, 21 U.S.C. section 360bbb-3(b)(1), unless the  authorization is terminated or revoked.  Performed at [facility], [address], [place], [place] [address]     Today   Subjective    Melvin Taylor today has no new complaints  No fever  Or chills   No Nausea, Vomiting or Diarrhea -I called and  updated patient's sister        Patient has been seen and examined prior to discharge   Objective   Blood pressure (!) 137/92, pulse 70, temperature 98.2 F (36.8 C), temperature source Oral, resp. rate 18, height 5\' 7"  (1.702 m), weight 58.4 kg, SpO2 99%.   Intake/Output Summary (Last 24 hours) at 08/26/2023 1454 Last data filed at 08/26/2023  1124 Gross per 24 hour  Intake 720 ml  Output 750 ml  Net -30 ml    Exam Gen:- Awake Alert, no acute distress  HEENT:- [place].AT, No sclera icterus Neck-Supple Neck,No JVD,.  Lungs-  CTAB , good air movement bilaterally CV- S1, S2 normal, regular Abd-  +ve B.Sounds, Abd Soft, No tenderness,    Extremity/Skin:- No  edema,   good pulses Psych-affect is appropriate, oriented x  2 Neuro-generalized weakness, residual right hemiparesis, no additional new focal deficits, no tremors    Data Review   CBC w Diff:  Lab Results  Component Value Date   WBC 4.6 08/24/2023   HGB 11.4 (L) 08/24/2023   HCT 34.4 (L) 08/24/2023   PLT 198 08/24/2023   LYMPHOPCT 30 08/23/2023   MONOPCT 18 08/23/2023   EOSPCT 1 08/23/2023   BASOPCT 0 08/23/2023    CMP:  Lab Results  Component Value Date   NA 135 08/26/2023   NA 140 08/05/2022   K 4.3 08/26/2023   CL 106 08/26/2023   CO2 21 (L) 08/26/2023   BUN 19 08/26/2023   BUN 16 08/05/2022   CREATININE 1.50 (H) 08/26/2023   CREATININE 0.99 03/17/2016   GLU 177 02/12/2020   PROT 6.6 08/23/2023   PROT 6.0 08/05/2022   ALBUMIN 3.3 (L) 08/25/2023   ALBUMIN 3.8 08/05/2022   BILITOT 1.1 08/23/2023   BILITOT 0.5 08/05/2022   ALKPHOS 48 08/23/2023   AST 30 08/23/2023   ALT 19 08/23/2023  .  Total Discharge time is about 33 minutes  Shon Hale M.D on 08/26/2023 at 2:54 PM  Go to [email or web address] -  for contact info  [facility] - Office  [phone]

## 2023-08-26 NOTE — Plan of Care (Signed)
  Problem: Education: Goal: Knowledge of risk factors and measures for prevention of condition will improve Outcome: Adequate for Discharge   Problem: Coping: Goal: Psychosocial and spiritual needs will be supported Outcome: Adequate for Discharge   Problem: Respiratory: Goal: Will maintain a patent airway Outcome: Adequate for Discharge Goal: Complications related to the disease process, condition or treatment will be avoided or minimized Outcome: Adequate for Discharge   Problem: Education: Goal: Knowledge of General Education information will improve Description: Including pain rating scale, medication(s)/side effects and non-pharmacologic comfort measures Outcome: Adequate for Discharge   Problem: Health Behavior/Discharge Planning: Goal: Ability to manage health-related needs will improve Outcome: Adequate for Discharge   Problem: Clinical Measurements: Goal: Ability to maintain clinical measurements within normal limits will improve Outcome: Adequate for Discharge Goal: Will remain free from infection Outcome: Adequate for Discharge Goal: Diagnostic test results will improve Outcome: Adequate for Discharge Goal: Respiratory complications will improve Outcome: Adequate for Discharge Goal: Cardiovascular complication will be avoided Outcome: Adequate for Discharge   Problem: Activity: Goal: Risk for activity intolerance will decrease Outcome: Adequate for Discharge   Problem: Nutrition: Goal: Adequate nutrition will be maintained Outcome: Adequate for Discharge   Problem: Coping: Goal: Level of anxiety will decrease Outcome: Adequate for Discharge   Problem: Elimination: Goal: Will not experience complications related to bowel motility Outcome: Adequate for Discharge Goal: Will not experience complications related to urinary retention Outcome: Adequate for Discharge   Problem: Pain Managment: Goal: General experience of comfort will improve and/or be  controlled Outcome: Adequate for Discharge   Problem: Safety: Goal: Ability to remain free from injury will improve Outcome: Adequate for Discharge   Problem: Skin Integrity: Goal: Risk for impaired skin integrity will decrease Outcome: Adequate for Discharge   Problem: Education: Goal: Ability to describe self-care measures that may prevent or decrease complications (Diabetes Survival Skills Education) will improve Outcome: Adequate for Discharge Goal: Individualized Educational Video(s) Outcome: Adequate for Discharge   Problem: Coping: Goal: Ability to adjust to condition or change in health will improve Outcome: Adequate for Discharge   Problem: Fluid Volume: Goal: Ability to maintain a balanced intake and output will improve Outcome: Adequate for Discharge   Problem: Health Behavior/Discharge Planning: Goal: Ability to identify and utilize available resources and services will improve Outcome: Adequate for Discharge Goal: Ability to manage health-related needs will improve Outcome: Adequate for Discharge   Problem: Metabolic: Goal: Ability to maintain appropriate glucose levels will improve Outcome: Adequate for Discharge   Problem: Nutritional: Goal: Maintenance of adequate nutrition will improve Outcome: Adequate for Discharge Goal: Progress toward achieving an optimal weight will improve Outcome: Adequate for Discharge   Problem: Skin Integrity: Goal: Risk for impaired skin integrity will decrease Outcome: Adequate for Discharge   Problem: Tissue Perfusion: Goal: Adequacy of tissue perfusion will improve Outcome: Adequate for Discharge

## 2023-08-26 NOTE — TOC Progression Note (Addendum)
Transition of Care [facility]) - Progression Note    Patient Details  Name: Melvin Taylor MRN: [id number] Date of Birth: 12/15/1965  Transition of Care [facility]) CM/SW Contact  Catalina Gravel, [place] Phone Number: 08/26/2023, 3:34 PM  Clinical Narrative:     Stafford from MD, pt will be DC today- pt had in home care up to 70 hours a week- MD wants to be sure that this is in place or resumes.  CSW reached out to sister who is POA- no answer left a VM.  Traditionally, pt insurance difficult to match for [facility]- TOC will need to determine who provider is.  TOC to follow.   Addendum:  No return call from sister as of 5:00 PM   Barriers to Discharge: No Barriers Identified  Expected Discharge Plan and Services         Expected Discharge Date: 08/26/23                                     Social Determinants of Health (SDOH) Interventions SDOH Screenings   Food Insecurity: No Food Insecurity (08/23/2023)  Housing: Low Risk  (08/23/2023)  Transportation Needs: No Transportation Needs (08/23/2023)  Utilities: Not At Risk (08/23/2023)  Depression (PHQ2-9): Low Risk  (06/01/2023)  Social Connections: Patient Unable To Answer (08/23/2023)  Tobacco Use: Medium Risk (08/23/2023)    Readmission Risk Interventions    12/21/2022   11:48 AM  Readmission Risk Prevention Plan  Transportation Screening Complete  PCP or Specialist Appt within 3-5 Days Complete  HRI or Home Care Consult Complete  Social Work Consult for Recovery Care Planning/Counseling Complete  Palliative Care Screening Not Applicable  Medication Review Oceanographer) Complete

## 2023-08-27 ENCOUNTER — Other Ambulatory Visit: Payer: Self-pay | Admitting: Adult Health

## 2023-08-28 ENCOUNTER — Telehealth: Payer: Self-pay

## 2023-08-28 LAB — CULTURE, BLOOD (ROUTINE X 2)
Culture: NO GROWTH
Culture: NO GROWTH
Special Requests: ADEQUATE
Special Requests: ADEQUATE

## 2023-08-28 LAB — GLUCOSE, CAPILLARY: Glucose-Capillary: 96 mg/dL (ref 70–99)

## 2023-08-28 NOTE — Telephone Encounter (Signed)
Dr. Marjory Lies  See the below, your note states pt is taking " continue divalproex 500mg  twice a day "  However sister said dose should be 250 mg. Rx pending for approval.

## 2023-08-28 NOTE — Telephone Encounter (Signed)
[name] returning call. States dosage is 250mg  per paperwork from the hospital

## 2023-08-28 NOTE — Telephone Encounter (Signed)
Copied from CRM [id number]. Topic: Appointments - Scheduling Inquiry for Clinic >> Aug 28, 2023  4:26 PM Ivette P wrote: Reason for CRM: Pt was discharged from hospital and needs a sooner appt than 02/03, pt is peeing himself due to pain in the right knee. Requesting callback [phone]

## 2023-08-28 NOTE — Telephone Encounter (Signed)
Last seen on 11/11/23 per note " continue divalproex 500mg  twice a day " Follow up scheduled on 11/13/23   I called and left message for sister [name] to call to confirm dose.

## 2023-08-29 ENCOUNTER — Telehealth: Payer: Self-pay

## 2023-08-29 NOTE — Transitions of Care (Post Inpatient/ED Visit) (Signed)
08/29/2023  Name: Melvin Taylor MRN: [id number] DOB: 22-Aug-1965  Today's TOC FU Call Status: Today's TOC FU Call Status:: Successful TOC FU Call Completed TOC FU Call Complete Date: 08/29/23 Patient's Name and Date of Birth confirmed.  Transition Care Management Follow-up Telephone Call Date of Discharge: 08/26/23 [facility] Type of Discharge: Inpatient Admission Primary Inpatient Discharge Diagnosis:: Acute metabolic encephalopathy How have you been since you were released from the hospital?: Better (Feeling better but has right knee pain) Any questions or concerns?: Yes Patient Questions/Concerns:: knee pain. Patient Questions/Concerns Addressed: Notified Provider of Patient Questions/Concerns, Other: (message sent to PCP about knee pain. Encoruaged sister to call MD office or send a My chart message)  Items Reviewed: Did you receive and understand the discharge instructions provided?: Yes Medications obtained,verified, and reconciled?: Yes (Medications Reviewed) Any new allergies since your discharge?: No Dietary orders reviewed?: Yes Type of Diet Ordered:: low sodium  heart healthy Do you have support at home?: Yes People in Home: sibling(s) Name of Support/Comfort Primary Source: 10 hours of caregiver service. daily,  otherwise sisters assist  Medications Reviewed Today: Medications Reviewed Today     Reviewed by Earlie Server, RN (Registered Nurse) on 08/29/23 at 1004  Med List Status: <None>   Medication Order Taking? Sig Documenting Provider Last Dose Status Informant  ACCU-CHEK GUIDE test strip [id number] Yes USE AS DIRECTED TO MONITOR BLOOD SUGAR (4) TIMES DAILY. [name], MD Taking Active Family Member, Pharmacy Records  acetaminophen (TYLENOL) 325 MG tablet [id number] Yes Take 2 tablets (650 mg total) by mouth every 6 (six) hours as needed for mild pain (pain score 1-3) (or Fever >/= 101). Shon Hale, MD Taking Active    allopurinol (ZYLOPRIM) 100 MG tablet [id number] Yes TAKE ONE TABLET BY MOUTH EVERY DAY [name], MD Taking Active Family Member, Pharmacy Records  amLODipine (NORVASC) 10 MG tablet [id number] Yes Take 10 mg by mouth daily. [provider] Taking Active Pharmacy Records, Family Member  atorvastatin (LIPITOR) 80 MG tablet [id number] Yes TAKE 1 TABLET BY MOUTH ONCE DAILY. [name], MD Taking Active Family Member, Pharmacy Records           Med Note [place], CYNTHIA M   Thu Aug 24, 2023  8:30 AM) Pt takes at bedtime  Cholecalciferol (VITAMIN D3) 50 MCG (2000 UT) capsule [id number] Yes Take 1 capsule (2,000 Units total) by mouth daily. [name], MD Taking Active Family Member, Pharmacy Records  divalproex (DEPAKOTE) 250 MG DR tablet [id number] Yes TAKE (1) TABLET BY MOUTH TWICE DAILY. Penumalli, Glenford Bayley, MD Taking Active   escitalopram (LEXAPRO) 20 MG tablet [id number] Yes Take 1 tablet (20 mg total) by mouth daily. Charlton Amor, PA-C Taking Active Pharmacy Records, Family Member  ferrous sulfate 325 (65 FE) MG tablet [id number] Yes Take 1 tablet (325 mg total) by mouth daily with breakfast. Shon Hale, MD Taking Active   isosorbide mononitrate (IMDUR) 30 MG 24 hr tablet [id number] Yes TAKE ONE TABLET BY MOUTH EVERY DAY [name], MD Taking Active Family Member, Pharmacy Records           Med Note Joanne Gavel, Dennard Schaumann Aug 24, 2023  8:44 AM)    lamoTRIgine (LAMICTAL) 25 MG tablet [id number] Yes TAKE 2 TABLETS BY MOUTH TWICE DAILY. Penumalli, Glenford Bayley, MD Taking Active Family Member, Pharmacy Records  Multiple Vitamin (MULTIVITAMIN WITH MINERALS) TABS tablet [id number] Yes Take 1 tablet by mouth daily. Angiulli,  Mcarthur Rossetti, PA-C Taking Active Pharmacy Records, Family Member  nirmatrelvir/ritonavir, renal dosing, (PAXLOVID) 10 x 150 MG & 10 x 100MG  TABS [id number] Yes Take 2 tablets by mouth 2 (two) times daily for 5 days. Shon Hale, MD Taking Active    ticagrelor (BRILINTA) 90 MG TABS tablet [id number] Yes Take 1 tablet (90 mg total) by mouth 2 (two) times daily. Charlton Amor, PA-C Taking Active Pharmacy Records, Family Member  UNABLE TO Derl Barrow [id number] Yes 1 each by Does not apply route daily. Med Name: GLOVES 1B/M OR PRN [name], MD Taking Active Family Member, Pharmacy Records  UNABLE TO FIND [id number] Yes 1 each by Does not apply route daily. Med Name: PULL UPS 100/M OR PRN [name], MD Taking Active Family Member, Pharmacy Records  UNABLE TO FIND [id number] Yes 1 each by Does not apply route daily. Med Name: NUTRITIONAL SUPPLEMENT 180U/M OR PRN [name], MD Taking Active Family Member, Pharmacy Records           Med Note Geraldine, CYNTHIA M   Thu Aug 24, 2023  8:43 AM) Glucerna once a day if they can get them to drink it  UNABLE TO FIND [id number] Yes 1 each by Does not apply route daily. Med Name: UNDER PADS/CHUX 30/M OR PRN [name], MD Taking Active Family Member, Pharmacy Records  Vitamin D, Ergocalciferol, (DRISDOL) 1.25 MG (50000 UNIT) CAPS capsule [id number] Yes Take 1 capsule (50,000 Units total) by mouth every 7 (seven) days. Charlton Amor, PA-C Taking Active Pharmacy Records, Family Member            Home Care and Equipment/Supplies: Were Home Health Services Ordered?: No Any new equipment or medical supplies ordered?: No  Functional Questionnaire: Do you need assistance with bathing/showering or dressing?: Yes Do you need assistance with meal preparation?: Yes Do you need assistance with eating?: No Do you have difficulty maintaining continence:  (patient having a hard time going to the bathroom due to knee pain. sister states that patient is having accidents.  I sugested patient get a urinal to assist with Bathroom needs.) Do you need assistance with getting out of bed/getting out of a chair/moving?: Yes Do you have difficulty managing or taking your medications?: No (meds are  bubbled packed)  Follow up appointments reviewed: PCP Follow-up appointment confirmed?: Yes Date of PCP follow-up appointment?: 09/04/23 Follow-up Provider: Dr. Durwin Nora Specialist [facility] Follow-up appointment confirmed?: NA Do you need transportation to your follow-up appointment?: No Do you understand care options if your condition(s) worsen?: Yes-patient verbalized understanding  SDOH Interventions Today    Flowsheet Row Most Recent Value  SDOH Interventions   Food Insecurity Interventions Intervention Not Indicated  Housing Interventions Intervention Not Indicated  Transportation Interventions Intervention Not Indicated  Utilities Interventions Intervention Not Indicated      Interventions Today    Flowsheet Row Most Recent Value  Chronic Disease   Chronic disease during today's visit Other  [CoVID]  General Interventions   General Interventions Discussed/Reviewed General Interventions Discussed, Labs, Doctor Visits  Doctor Visits Discussed/Reviewed Doctor Visits Discussed, Doctor Visits Reviewed  Exercise Interventions   Exercise Discussed/Reviewed Exercise Discussed  Education Interventions   Education Provided Provided Education  Provided Verbal Education On Nutrition, Labs, Blood Sugar Monitoring, Exercise, Medication, When to see the doctor, Other  [discussed urinal]  Labs Reviewed Kidney Function  Nutrition Interventions   Nutrition Discussed/Reviewed Nutrition Discussed, Fluid intake  [Reviewed supplements and importance]  Pharmacy Interventions   Pharmacy Dicussed/Reviewed  Medications and their functions, Medication Adherence       TOC Interventions Today    Flowsheet Row Most Recent Value  TOC Interventions   TOC Interventions Discussed/Reviewed TOC Interventions Discussed, TOC Interventions Reviewed, Post discharge activity limitations per provider, S/S of infection       Spoke with sister who is HCPOA.  Sister reports that patient is doing well.  Patients biggest concern right now is his knee pain, Sister reports PCP is aware. Reports patient has had 3 injections to knee without any improvement.  Sister states that she requested for patient to have an MRI while at the hospital.. I did not see any orders or results.  Patient has CG services for 10 hours per day.  Meds are bubble packed.   Interventions: reviewed with sister the need to wear a mask to prevent spread of COVID.  Reviewed all medications. Encouraged sister to make sure [facility] is notified in the future when patient is discharged of new medications or medication changes.  Reviewed OTC lidocaine or pain patches. Reviewed benefits of using a urinal to prevent urination accidents. Encouraged sister to call MD office or send a message about patients pain.  This note sent to MD.    Lonia Chimera, RN, BSN, CEN Population Health- Transition of Care Team.  [facility] [phone]

## 2023-08-29 NOTE — Telephone Encounter (Signed)
Pt added to wait list  No available openings at this time

## 2023-09-01 ENCOUNTER — Inpatient Hospital Stay: Payer: Medicaid Other

## 2023-09-01 VITALS — BP 105/77 | HR 74 | Temp 97.1°F | Resp 18

## 2023-09-01 DIAGNOSIS — D638 Anemia in other chronic diseases classified elsewhere: Secondary | ICD-10-CM

## 2023-09-01 DIAGNOSIS — N1832 Chronic kidney disease, stage 3b: Secondary | ICD-10-CM

## 2023-09-01 DIAGNOSIS — D631 Anemia in chronic kidney disease: Secondary | ICD-10-CM | POA: Diagnosis present

## 2023-09-01 DIAGNOSIS — D649 Anemia, unspecified: Secondary | ICD-10-CM

## 2023-09-01 LAB — CBC
HCT: 30.7 % — ABNORMAL LOW (ref 39.0–52.0)
Hemoglobin: 10.4 g/dL — ABNORMAL LOW (ref 13.0–17.0)
MCH: 30.6 pg (ref 26.0–34.0)
MCHC: 33.9 g/dL (ref 30.0–36.0)
MCV: 90.3 fL (ref 80.0–100.0)
Platelets: 320 10*3/uL (ref 150–400)
RBC: 3.4 MIL/uL — ABNORMAL LOW (ref 4.22–5.81)
RDW: 14.3 % (ref 11.5–15.5)
WBC: 4.3 10*3/uL (ref 4.0–10.5)
nRBC: 0 % (ref 0.0–0.2)

## 2023-09-01 MED ORDER — EPOETIN ALFA-EPBX 10000 UNIT/ML IJ SOLN: 5000.0000 [IU] | Freq: Once | INTRAMUSCULAR | Status: DC

## 2023-09-01 MED ORDER — EPOETIN ALFA-EPBX 2000 UNIT/ML IJ SOLN
2000.0000 [IU] | Freq: Once | INTRAMUSCULAR | Status: AC
Start: 2023-09-01 — End: 2023-09-01
  Administered 2023-09-01: 2000 [IU] via SUBCUTANEOUS
  Filled 2023-09-01: qty 1

## 2023-09-01 MED ORDER — EPOETIN ALFA-EPBX 3000 UNIT/ML IJ SOLN
3000.0000 [IU] | Freq: Once | INTRAMUSCULAR | Status: AC
  Administered 2023-09-01: 3000 [IU] via SUBCUTANEOUS
  Filled 2023-09-01: qty 1

## 2023-09-01 NOTE — Progress Notes (Signed)
Patient presents today for Retracrit injection per providers order.  Vital signs WNL.  Patient has no new complaints at this time.    Stable during administration without incident; injection site WNL; see MAR for injection details.  Patient tolerated procedure well and without incident.  No questions or complaints noted at this time.

## 2023-09-02 ENCOUNTER — Encounter: Payer: Self-pay | Admitting: Hematology

## 2023-09-04 ENCOUNTER — Encounter: Payer: Self-pay | Admitting: Internal Medicine

## 2023-09-04 ENCOUNTER — Ambulatory Visit (INDEPENDENT_AMBULATORY_CARE_PROVIDER_SITE_OTHER): Payer: Medicaid Other | Admitting: Internal Medicine

## 2023-09-04 VITALS — BP 113/75 | HR 79

## 2023-09-04 DIAGNOSIS — F32A Depression, unspecified: Secondary | ICD-10-CM

## 2023-09-04 DIAGNOSIS — I10 Essential (primary) hypertension: Secondary | ICD-10-CM

## 2023-09-04 DIAGNOSIS — F419 Anxiety disorder, unspecified: Secondary | ICD-10-CM

## 2023-09-04 DIAGNOSIS — G40909 Epilepsy, unspecified, not intractable, without status epilepticus: Secondary | ICD-10-CM

## 2023-09-04 DIAGNOSIS — E782 Mixed hyperlipidemia: Secondary | ICD-10-CM | POA: Diagnosis not present

## 2023-09-04 DIAGNOSIS — E1159 Type 2 diabetes mellitus with other circulatory complications: Secondary | ICD-10-CM

## 2023-09-04 DIAGNOSIS — N1832 Chronic kidney disease, stage 3b: Secondary | ICD-10-CM | POA: Diagnosis not present

## 2023-09-04 DIAGNOSIS — D638 Anemia in other chronic diseases classified elsewhere: Secondary | ICD-10-CM

## 2023-09-04 NOTE — Patient Instructions (Signed)
It was a pleasure to see you today.  Thank you for giving [place] the opportunity to be involved in your care.  Below is a brief recap of your visit and next steps.  We will plan to see you again in 3 months.  Summary No medication changes today I recommend scheduling follow up with orthopedic surgery for further treatment on your knee Follow up in 3 months

## 2023-09-04 NOTE — Progress Notes (Signed)
 Established Patient Office Visit  Subjective   Patient ID: Melvin Taylor, male    DOB: Dec 20, 1965  Age: 58 y.o. MRN: [id number]  Chief Complaint  Patient presents with   Care Management    Follow up, patient having difficulties with right knee as discussed in previous visits   [name] returns to care today for routine follow-up.  He was last seen by me in October 2024.  No medication changes were made at that time and 59-month follow-up was arranged.  In the interim, he has been evaluated by orthopedic surgery and hematology.  Recent hospital admission 1/22 - 1/25 in the setting of acute metabolic encephalopathy attributed to COVID-19 and dehydration.  There have otherwise been no acute interval events. [name] endorses chronic knee pain but otherwise reports feeling well today.  He does not have any acute concerns to discuss.  Past Medical History:  Diagnosis Date   Bipolar 1 disorder (HCC)    depression/anxiety   Bone spur    left heel   Cervical radiculopathy    Chronic back pain    Chronic chest wall pain    Chronic neck pain    Coronary artery disease    a. s/p CABG in 03/2016 with LIMA-LAD, SVG-D1, SVG-RCA, and Seq SVG-mid and distal OM   Diabetes mellitus    Type II   Hallucinations    "long history of them"   Headache(784.0)    History of gout    HTN (hypertension)    Insomnia    Neuropathy    Pain management    Polysubstance abuse (HCC)    Right leg pain    chronic   Schizophrenia (HCC)    Seizures (HCC)    last sz between July 5-9th, 2016; epilepsy   Sleep apnea    Stroke (HCC)    Transfusion of blood product refused for religious reason    Past Surgical History:  Procedure Laterality Date   ANTERIOR CERVICAL DECOMP/DISCECTOMY FUSION N/A 12/14/2015   Procedure: ANTERIOR CERVICAL DECOMPRESSION/DISCECTOMY FUSION CERVICAL FIVE -SIX;  Surgeon: Julio Sicks, MD;  Location: [facility];  Service: Neurosurgery;  Laterality: N/A;   BIOPSY  05/07/2015    Procedure: BIOPSY (Gastric);  Surgeon: Corbin Ade, MD;  Location: [facility] ORS;  Service: Endoscopy;;   CARDIAC CATHETERIZATION N/A 03/07/2016   Procedure: Left Heart Cath and Coronary Angiography;  Surgeon: Lyn Records, MD;  Location: [facility] INVASIVE CV LAB;  Service: Cardiovascular;  Laterality: N/A;   COLONOSCOPY WITH PROPOFOL N/A 05/07/2015   WUJ:[id number] diverticulosis, multiple colon polyps removed, tubular adenoma, serrated colon polyp. Next colonoscopy October 2019   COLONOSCOPY WITH PROPOFOL N/A 08/02/2018   Procedure: COLONOSCOPY WITH PROPOFOL;  Surgeon: Corbin Ade, MD;  Location: [facility] END[facility] SU[facility];  Service: Endoscopy;  Laterality: N/A;  12:00pm   CORONARY ARTERY BYPASS GRAFT N/A 03/28/2016   Procedure: CORONARY ARTERY BYPASS GRAFTING (CABG) x 5 USING GREATER SAPHENOUS VEIN;  Surgeon: Alleen Borne, MD;  Location: [facility] OR;  Service: Open Heart Surgery;  Laterality: N/A;   ENDOVEIN HARVEST OF GREATER SAPHENOUS VEIN Right 03/28/2016   Procedure: ENDOVEIN HARVEST OF GREATER SAPHENOUS VEIN;  Surgeon: Alleen Borne, MD;  Location: [facility] OR;  Service: Open Heart Surgery;  Laterality: Right;   ESOPHAGEAL DILATION N/A 05/07/2015   Procedure: ESOPHAGEAL DILATION WITH 56FR MALONEY DILATOR;  Surgeon: Corbin Ade, MD;  Location: [facility] ORS;  Service: Endoscopy;  Laterality: N/A;   ESOPHAGOGASTRODUODENOSCOPY (EGD) WITH PROPOFOL N/A 05/07/2015  RMR: Status post dilation of normal esophagus. Gastritis.   IR CT HEAD LTD  12/17/2020   IR CT HEAD LTD  12/17/2020   IR INTRA CRAN STENT  12/17/2020   IR PERCUTANEOUS ART THROMBECTOMY/INFUSION INTRACRANIAL INC DIAG ANGIO  12/17/2020   IR RADIOLOGIST EVAL & MGMT  03/25/2021   KNEE SURGERY Left    arthroscopy   MANDIBLE FRACTURE SURGERY     POLYPECTOMY  05/07/2015   Procedure: POLYPECTOMY (Hepatic Flexure, Distal Transverse Colon, Rectal);  Surgeon: Corbin Ade, MD;  Location: [facility];  Service: Endoscopy;;   RADIOLOGY WITH ANESTHESIA N/A 12/16/2020   Procedure: IR WITH  ANESTHESIA;  Surgeon: Julieanne Cotton, MD;  Location: [facility] OR;  Service: Radiology;  Laterality: N/A;   TEE WITHOUT CARDIOVERSION N/A 03/28/2016   Procedure: TRANSESOPHAGEAL ECHOCARDIOGRAM (TEE);  Surgeon: Alleen Borne, MD;  Location: [facility] OR;  Service: Open Heart Surgery;  Laterality: N/A;   TONSILLECTOMY     TRIGGER FINGER RELEASE Left 10/15/2021   Procedure: RELEASE TRIGGER FINGER/A-1 PULLEY left middle or long finger;  Surgeon: Vickki Hearing, MD;  Location: [facility]S;  Service: Orthopedics;  Laterality: Left;   Social History   Tobacco Use   Smoking status: Former    Current packs/day: 0.50    Average packs/day: 0.5 packs/day for 30.0 years (15.0 ttl pk-yrs)    Types: Cigarettes   Smokeless tobacco: Never   Tobacco comments:    3 cigs per day  Vaping Use   Vaping status: Never Used  Substance Use Topics   Alcohol use: Not Currently   Drug use: Not Currently    Types: Marijuana   Family History  Problem Relation Age of Onset   Diabetes Father    Hypertension Father    Sleep apnea Father    Stroke Sister    Arthritis Other    Diabetes Other    Asthma Other    Allergies  Allergen Reactions   Aspirin Itching, Swelling and Other (See Comments)    Facial swelling   Review of Systems  Constitutional:  Negative for chills and fever.  HENT:  Negative for sore throat.   Respiratory:  Negative for cough and shortness of breath.   Cardiovascular:  Negative for chest pain, palpitations and leg swelling.  Gastrointestinal:  Negative for abdominal pain, blood in stool, constipation, diarrhea, nausea and vomiting.  Genitourinary:  Negative for dysuria and hematuria.  Musculoskeletal:  Positive for joint pain (chronic right knee pain). Negative for myalgias.  Skin:  Negative for itching and rash.  Neurological:  Negative for dizziness and headaches.  Psychiatric/Behavioral:  Negative for depression and suicidal ideas.      Objective:     BP 113/75 (BP Location: Left Arm,  Patient Position: Sitting, Cuff Size: Normal)   Pulse 79   SpO2 95%  BP Readings from Last 3 Encounters:  09/15/23 103/68  09/15/23 113/75  09/04/23 113/75   Physical Exam Vitals reviewed.  Constitutional:      General: He is not in acute distress.    Appearance: Normal appearance. He is not ill-appearing.     Comments: Examined in wheelchair  HENT:     Head: Normocephalic and atraumatic.     Right Ear: External ear normal.     Left Ear: External ear normal.     Nose: Nose normal. No congestion or rhinorrhea.     Mouth/Throat:     Mouth: Mucous membranes are moist.     Pharynx: Oropharynx is clear.  Eyes:  General: No scleral icterus.    Extraocular Movements: Extraocular movements intact.     Conjunctiva/sclera: Conjunctivae normal.     Pupils: Pupils are equal, round, and reactive to light.  Cardiovascular:     Rate and Rhythm: Normal rate and regular rhythm.     Pulses: Normal pulses.     Heart sounds: Normal heart sounds. No murmur heard. Pulmonary:     Effort: Pulmonary effort is normal.     Breath sounds: Normal breath sounds. No wheezing, rhonchi or rales.  Abdominal:     General: Abdomen is flat. Bowel sounds are normal. There is no distension.     Palpations: Abdomen is soft.     Tenderness: There is no abdominal tenderness.  Musculoskeletal:        General: Tenderness (Anterior aspect of right knee) present. No swelling or deformity.     Cervical back: Normal range of motion.     Comments: Reduced ROM of the right knee  Skin:    General: Skin is warm and dry.     Capillary Refill: Capillary refill takes less than 2 seconds.  Neurological:     General: No focal deficit present.     Mental Status: He is alert and oriented to person, place, and time.     Motor: No weakness.  Psychiatric:        Mood and Affect: Mood normal.        Behavior: Behavior normal.        Thought Content: Thought content normal.   Last CBC Lab Results  Component Value Date    WBC 4.0 09/15/2023   HGB 10.2 (L) 09/15/2023   HCT 30.3 (L) 09/15/2023   MCV 91.0 09/15/2023   MCH 30.6 09/15/2023   RDW 14.9 09/15/2023   PLT 157 09/15/2023   Last metabolic panel Lab Results  Component Value Date   GLUCOSE 85 08/26/2023   NA 135 08/26/2023   K 4.3 08/26/2023   CL 106 08/26/2023   CO2 21 (L) 08/26/2023   BUN 19 08/26/2023   CREATININE 1.50 (H) 08/26/2023   GFRNONAA 53 (L) 08/26/2023   CALCIUM 8.5 (L) 08/26/2023   PHOS 3.0 08/25/2023   PROT 6.6 08/23/2023   ALBUMIN 3.3 (L) 08/25/2023   LABGLOB 2.2 03/02/2023   AGRATIO 1.7 03/02/2023   BILITOT 1.1 08/23/2023   ALKPHOS 48 08/23/2023   AST 30 08/23/2023   ALT 19 08/23/2023   ANIONGAP 8 08/26/2023   Last lipids Lab Results  Component Value Date   CHOL 102 08/28/2022   HDL 42 08/28/2022   LDLCALC 46 08/28/2022   TRIG 71 08/28/2022   CHOLHDL 2.4 08/28/2022   Last hemoglobin A1c Lab Results  Component Value Date   HGBA1C 5.8 (H) 08/23/2023   Last thyroid functions Lab Results  Component Value Date   TSH 2.703 03/02/2023   T4TOTAL 8.9 01/26/2022   Last vitamin D Lab Results  Component Value Date   VD25OH 90.2 08/05/2022   Last vitamin B12 and Folate Lab Results  Component Value Date   VITAMINB12 527 05/23/2023   FOLATE 14.0 03/02/2023     Assessment & Plan:   Problem List Items Addressed This Visit       Primary hypertension - Primary   Remains adequately controlled on current antihypertensive regimen.  Consider addition of ACE/ARB at future appointment in the setting of diabetes mellitus and CKD      Diabetes mellitus (HCC)   A1c 5.8 on recent labs.  Remains well-controlled  with diet.      Seizure disorder (HCC) (Chronic)   Denies recent seizure activity.  Remains on Depakote and Lamictal.  Neurology follow-up scheduled for April.      Stage 3b chronic kidney disease (CKD) (HCC)   Stable on latest available labs.  Followed by nephrology (Dr. Wolfgang Phoenix).      Hyperlipidemia    Remains well-controlled on atorvastatin 80 mg daily.      Anxiety and depression   Mood remains stable and anxiety adequately controlled with Lexapro      Anemia, chronic disease   Retacrit infusions per hematology.  Follow-up is scheduled for March.      Return in about 3 months (around 12/02/2023).   [name], MD

## 2023-09-05 ENCOUNTER — Other Ambulatory Visit: Payer: Self-pay | Admitting: Internal Medicine

## 2023-09-06 ENCOUNTER — Encounter: Payer: Self-pay | Admitting: Hematology

## 2023-09-14 ENCOUNTER — Encounter: Payer: Self-pay | Admitting: Hematology

## 2023-09-15 ENCOUNTER — Encounter: Payer: Self-pay | Admitting: Hematology

## 2023-09-15 ENCOUNTER — Inpatient Hospital Stay: Payer: Medicaid Other | Attending: Hematology

## 2023-09-15 ENCOUNTER — Inpatient Hospital Stay: Payer: Medicaid Other

## 2023-09-15 ENCOUNTER — Ambulatory Visit: Payer: Medicaid Other | Admitting: Orthopedic Surgery

## 2023-09-15 ENCOUNTER — Encounter: Payer: Self-pay | Admitting: Orthopedic Surgery

## 2023-09-15 VITALS — BP 103/68 | HR 76 | Temp 98.1°F | Resp 18

## 2023-09-15 VITALS — BP 113/75 | Ht 67.0 in | Wt 128.0 lb

## 2023-09-15 DIAGNOSIS — D631 Anemia in chronic kidney disease: Secondary | ICD-10-CM | POA: Insufficient documentation

## 2023-09-15 DIAGNOSIS — M25562 Pain in left knee: Secondary | ICD-10-CM

## 2023-09-15 DIAGNOSIS — N1832 Chronic kidney disease, stage 3b: Secondary | ICD-10-CM

## 2023-09-15 DIAGNOSIS — G8929 Other chronic pain: Secondary | ICD-10-CM

## 2023-09-15 DIAGNOSIS — D649 Anemia, unspecified: Secondary | ICD-10-CM

## 2023-09-15 DIAGNOSIS — M25561 Pain in right knee: Secondary | ICD-10-CM | POA: Diagnosis not present

## 2023-09-15 DIAGNOSIS — D638 Anemia in other chronic diseases classified elsewhere: Secondary | ICD-10-CM

## 2023-09-15 LAB — CBC
HCT: 30.3 % — ABNORMAL LOW (ref 39.0–52.0)
Hemoglobin: 10.2 g/dL — ABNORMAL LOW (ref 13.0–17.0)
MCH: 30.6 pg (ref 26.0–34.0)
MCHC: 33.7 g/dL (ref 30.0–36.0)
MCV: 91 fL (ref 80.0–100.0)
Platelets: 157 10*3/uL (ref 150–400)
RBC: 3.33 MIL/uL — ABNORMAL LOW (ref 4.22–5.81)
RDW: 14.9 % (ref 11.5–15.5)
WBC: 4 10*3/uL (ref 4.0–10.5)
nRBC: 0 % (ref 0.0–0.2)

## 2023-09-15 MED ORDER — METHYLPREDNISOLONE ACETATE 40 MG/ML IJ SUSP
40.0000 mg | Freq: Once | INTRAMUSCULAR | Status: AC
  Administered 2023-09-15: 40 mg via INTRA_ARTICULAR

## 2023-09-15 MED ORDER — EPOETIN ALFA-EPBX 3000 UNIT/ML IJ SOLN
3000.0000 [IU] | Freq: Once | INTRAMUSCULAR | Status: AC
  Administered 2023-09-15: 3000 [IU] via SUBCUTANEOUS
  Filled 2023-09-15: qty 1

## 2023-09-15 MED ORDER — EPOETIN ALFA-EPBX 10000 UNIT/ML IJ SOLN: 5000.0000 [IU] | Freq: Once | INTRAMUSCULAR | Status: DC

## 2023-09-15 MED ORDER — EPOETIN ALFA-EPBX 2000 UNIT/ML IJ SOLN
2000.0000 [IU] | Freq: Once | INTRAMUSCULAR | Status: AC
  Administered 2023-09-15: 2000 [IU] via SUBCUTANEOUS
  Filled 2023-09-15: qty 1

## 2023-09-15 NOTE — Progress Notes (Addendum)
   Subjective:    Patient ID: Melvin Taylor, male    DOB: 1965/12/23, 58 y.o.   MRN: [id number]  Melvin Taylor f/b Dr Dallas Schimke  Work in for chronic knee pain    He's not a surgical candidate. There is nothing I can do from a surgical standpoint   He says his back is not hurting and neither are his legs   He refuses to ambulate , probably too hard to do and he's deconditioned     Past Medical History:  Diagnosis Date   Bipolar 1 disorder (HCC)    depression/anxiety   Bone spur    left heel   Cervical radiculopathy    Chronic back pain    Chronic chest wall pain    Chronic neck pain    Coronary artery disease    a. s/p CABG in 03/2016 with LIMA-LAD, SVG-D1, SVG-RCA, and Seq SVG-mid and distal OM   Diabetes mellitus    Type II   Hallucinations    "long history of them"   Headache(784.0)    History of gout    HTN (hypertension)    Insomnia    Neuropathy    Pain management    Polysubstance abuse (HCC)    Right leg pain    chronic   Schizophrenia (HCC)    Seizures (HCC)    last sz between July 5-9th, 2016; epilepsy   Sleep apnea    Stroke [facility])    Transfusion of blood product refused for religious reason        Objective:   Physical Exam  Chronic flexion contractures of the knees. No effxs  No erythema or warmth to the joints    Wheelchair no ev of ambulation     Assessment & Plan:   Encounter Diagnoses  Name Primary?   Chronic pain of right knee Yes   Chronic pain of left knee    Procedure note for bilateral knee injections  Procedure note left knee injection verbal consent was obtained to inject left knee joint  Timeout was completed to confirm the site of injection  The medications used were 40 mg depomedrol and 3 cc of 1% lidocaine  Anesthesia was provided by ethyl chloride and the skin was prepped with alcohol.  After cleaning the skin with alcohol a 20-gauge needle was used to inject the left knee joint. There were no complications. A sterile  bandage was applied.   Procedure note right knee injection verbal consent was obtained to inject right knee joint  Timeout was completed to confirm the site of injection  The medications used were 40 mg depomedrol and 3 cc of 1% lidocaine  Anesthesia was provided by ethyl chloride and the skin was prepped with alcohol.  After cleaning the skin with alcohol a 20-gauge needle was used to inject the right knee joint. There were no complications. A sterile bandage was applied.   I have no treatment options for him because of his medical condition and chronic pain and contractures;  I do not think injections will be helpful anymore.   No follow ups unless there is a new problem

## 2023-09-15 NOTE — Progress Notes (Signed)
Patient presents today for Retacrit injection per providers order.  Hgb noted to be 10.2.  Vitals signs within parameters for injection.  Stable dueing administration without incident; injection site WNL; see MAR for injection details.  Patient tolerated procedure well and without incident.  No questions or complaints noted at this time.

## 2023-09-15 NOTE — Patient Instructions (Signed)
[facility] [place] - A DEPT OF [facility]  Discharge Instructions: Thank you for choosing [facility] to provide your oncology and hematology care.  If you have a lab appointment with the Cancer Center - please note that after April 8th, 2024, all labs will be drawn in the cancer center.  You do not have to check in or register with the main entrance as you have in the past but will complete your check-in in the cancer center.  Wear comfortable clothing and clothing appropriate for easy access to any Portacath or PICC line.   We strive to give you quality time with your provider. You may need to reschedule your appointment if you arrive late (15 or more minutes).  Arriving late affects you and other patients whose appointments are after yours.  Also, if you miss three or more appointments without notifying the office, you may be dismissed from the clinic at the provider's discretion.      For prescription refill requests, have your pharmacy contact our office and allow 72 hours for refills to be completed.    Today you received the following chemotherapy and/or immunotherapy agents Retacrit      To help prevent nausea and vomiting after your treatment, we encourage you to take your nausea medication as directed.  BELOW ARE SYMPTOMS THAT SHOULD BE REPORTED IMMEDIATELY: *FEVER GREATER THAN 100.4 F (38 C) OR HIGHER *CHILLS OR SWEATING *NAUSEA AND VOMITING THAT IS NOT CONTROLLED WITH YOUR NAUSEA MEDICATION *UNUSUAL SHORTNESS OF BREATH *UNUSUAL BRUISING OR BLEEDING *URINARY PROBLEMS (pain or burning when urinating, or frequent urination) *BOWEL PROBLEMS (unusual diarrhea, constipation, pain near the anus) TENDERNESS IN MOUTH AND THROAT WITH OR WITHOUT PRESENCE OF ULCERS (sore throat, sores in mouth, or a toothache) UNUSUAL RASH, SWELLING OR PAIN  UNUSUAL VAGINAL DISCHARGE OR ITCHING   Items with * indicate a potential emergency and should be followed up  as soon as possible or go to the Emergency Department if any problems should occur.  Please show the CHEMOTHERAPY ALERT CARD or IMMUNOTHERAPY ALERT CARD at check-in to the Emergency Department and triage nurse.  Should you have questions after your visit or need to cancel or reschedule your appointment, please contact [facility] [place] - A DEPT OF [facility] [phone]  and follow the prompts.  Office hours are 8:00 a.m. to 4:30 p.m. Monday - Friday. Please note that voicemails left after 4:00 p.m. may not be returned until the following business day.  We are closed weekends and major holidays. You have access to a nurse at all times for urgent questions. Please call the main number to the clinic [phone] and follow the prompts.  For any non-urgent questions, you may also contact your provider using MyChart. We now offer e-Visits for anyone 90 and older to request care online for non-urgent symptoms. For details visit [email or web address].   Also download the MyChart app! Go to the app store, search "MyChart", open the app, select [place], and log in with your MyChart username and password.

## 2023-09-15 NOTE — Progress Notes (Signed)
   There were no vitals taken for this visit.  There is no height or weight on file to calculate BMI.  Chief Complaint  Patient presents with   Knee Pain    Right knee pain work in today states knee painful previous injection helped for a while but does not want another one     No diagnosis found.  DOI/DOS/ Date: none  Worse

## 2023-09-18 NOTE — Tx Team (Signed)
Yes I addended it

## 2023-09-28 ENCOUNTER — Other Ambulatory Visit: Payer: Self-pay

## 2023-09-28 DIAGNOSIS — D638 Anemia in other chronic diseases classified elsewhere: Secondary | ICD-10-CM

## 2023-09-29 ENCOUNTER — Inpatient Hospital Stay: Payer: Medicaid Other

## 2023-09-29 DIAGNOSIS — Z8673 Personal history of transient ischemic attack (TIA), and cerebral infarction without residual deficits: Secondary | ICD-10-CM | POA: Insufficient documentation

## 2023-10-02 NOTE — Assessment & Plan Note (Signed)
 Denies recent seizure activity.  Remains on Depakote and Lamictal.  Neurology follow-up scheduled for April.

## 2023-10-02 NOTE — Assessment & Plan Note (Signed)
 Retacrit infusions per hematology.  Follow-up is scheduled for March.

## 2023-10-02 NOTE — Assessment & Plan Note (Signed)
 Remains well-controlled on atorvastatin 80 mg daily.

## 2023-10-02 NOTE — Assessment & Plan Note (Signed)
Mood remains stable and anxiety adequately controlled with Lexapro.

## 2023-10-02 NOTE — Assessment & Plan Note (Signed)
 Remains adequately controlled on current antihypertensive regimen.  Consider addition of ACE/ARB at future appointment in the setting of diabetes mellitus and CKD

## 2023-10-02 NOTE — Assessment & Plan Note (Signed)
 Stable on latest available labs.  Followed by nephrology (Dr. Wolfgang Phoenix).

## 2023-10-02 NOTE — Assessment & Plan Note (Signed)
 A1c 5.8 on recent labs.  Remains well-controlled with diet.

## 2023-10-03 ENCOUNTER — Other Ambulatory Visit: Payer: Self-pay | Admitting: Internal Medicine

## 2023-10-11 ENCOUNTER — Telehealth: Payer: Self-pay | Admitting: Gastroenterology

## 2023-10-11 ENCOUNTER — Telehealth: Payer: Self-pay | Admitting: *Deleted

## 2023-10-11 NOTE — Telephone Encounter (Signed)
 Spoke with patient's sister about scheduling office visit before colonoscopy.  She said he just got out of the hospital today from a fall and a brain bleed.  She said he would be going to Rehab and then have at home therapy and best to call back in a month or so.

## 2023-10-11 NOTE — Telephone Encounter (Signed)
 error

## 2023-10-13 ENCOUNTER — Inpatient Hospital Stay: Payer: Medicaid Other | Attending: Hematology

## 2023-10-13 ENCOUNTER — Inpatient Hospital Stay: Payer: Medicaid Other

## 2023-10-13 VITALS — BP 100/70 | HR 58 | Temp 97.0°F | Resp 18

## 2023-10-13 DIAGNOSIS — D638 Anemia in other chronic diseases classified elsewhere: Secondary | ICD-10-CM

## 2023-10-13 DIAGNOSIS — D631 Anemia in chronic kidney disease: Secondary | ICD-10-CM | POA: Insufficient documentation

## 2023-10-13 DIAGNOSIS — N1832 Chronic kidney disease, stage 3b: Secondary | ICD-10-CM

## 2023-10-13 LAB — CBC
HCT: 29.6 % — ABNORMAL LOW (ref 39.0–52.0)
Hemoglobin: 9.5 g/dL — ABNORMAL LOW (ref 13.0–17.0)
MCH: 29.9 pg (ref 26.0–34.0)
MCHC: 32.1 g/dL (ref 30.0–36.0)
MCV: 93.1 fL (ref 80.0–100.0)
Platelets: 253 10*3/uL (ref 150–400)
RBC: 3.18 MIL/uL — ABNORMAL LOW (ref 4.22–5.81)
RDW: 15.7 % — ABNORMAL HIGH (ref 11.5–15.5)
WBC: 5.1 10*3/uL (ref 4.0–10.5)
nRBC: 0 % (ref 0.0–0.2)

## 2023-10-13 MED ORDER — EPOETIN ALFA-EPBX 3000 UNIT/ML IJ SOLN
3000.0000 [IU] | Freq: Once | INTRAMUSCULAR | Status: AC
  Administered 2023-10-13: 3000 [IU] via SUBCUTANEOUS
  Filled 2023-10-13: qty 1

## 2023-10-13 MED ORDER — EPOETIN ALFA-EPBX 2000 UNIT/ML IJ SOLN
2000.0000 [IU] | Freq: Once | INTRAMUSCULAR | Status: AC
  Administered 2023-10-13: 2000 [IU] via SUBCUTANEOUS
  Filled 2023-10-13: qty 1

## 2023-10-13 NOTE — Progress Notes (Signed)
 Hemoglobin today is 9.5.  We will proceed with Retacrit injection per provider orders.  Patient tolerated injection with no complaints voiced.  Site clean and dry with no bruising or swelling noted.  No complaints of pain.  Discharged with vital signs stable and no signs or symptoms of distress noted.

## 2023-10-13 NOTE — Patient Instructions (Signed)
 [facility] [place] - A DEPT OF [facility]  Discharge Instructions: Thank you for choosing [facility] to provide your oncology and hematology care.  If you have a lab appointment with the Cancer Center - please note that after April 8th, 2024, all labs will be drawn in the cancer center.  You do not have to check in or register with the main entrance as you have in the past but will complete your check-in in the cancer center.  Wear comfortable clothing and clothing appropriate for easy access to any Portacath or PICC line.   We strive to give you quality time with your provider. You may need to reschedule your appointment if you arrive late (15 or more minutes).  Arriving late affects you and other patients whose appointments are after yours.  Also, if you miss three or more appointments without notifying the office, you may be dismissed from the clinic at the provider's discretion.      For prescription refill requests, have your pharmacy contact our office and allow 72 hours for refills to be completed.    Today you received the following :  Retacrit.  Epoetin Alfa Injection What is this medication? EPOETIN ALFA (e POE e tin AL fa) treats low levels of red blood cells (anemia) caused by kidney disease, chemotherapy, or HIV medications. It can also be used in people who are at risk for blood loss during surgery. It works by Systems analyst make more red blood cells, which reduces the need for blood transfusions. This medicine may be used for other purposes; ask your health care provider or pharmacist if you have questions. COMMON BRAND NAME(S): Epogen, Procrit, Retacrit What should I tell my care team before I take this medication? They need to know if you have any of these conditions: Blood clots Cancer Heart disease High blood pressure On dialysis Seizures Stroke An unusual or allergic reaction to epoetin alfa, albumin, benzyl alcohol, other  medications, foods, dyes, or preservatives Pregnant or trying to get pregnant Breast-feeding How should I use this medication? This medication is injected into a vein or under the skin. It is usually given by your care team in a hospital or clinic setting. It may also be given at home. If you get this medication at home, you will be taught how to prepare and give it. Use exactly as directed. Take it as directed on the prescription label at the same time every day. Keep taking it unless your care team tells you to stop. It is important that you put your used needles and syringes in a special sharps container. Do not put them in a trash can. If you do not have a sharps container, call your pharmacist or care team to get one. A special MedGuide will be given to you by the pharmacist with each prescription and refill. Be sure to read this information carefully each time. Talk to your care team about the use of this medication in children. While this medication may be used in children as young as 1 month of age for selected conditions, precautions do apply. Overdosage: If you think you have taken too much of this medicine contact a poison control center or emergency room at once. NOTE: This medicine is only for you. Do not share this medicine with others. What if I miss a dose? If you miss a dose, take it as soon as you can. If it is almost time for your  next dose, take only that dose. Do not take double or extra doses. What may interact with this medication? Darbepoetin alfa Methoxy polyethylene glycol-epoetin beta This list may not describe all possible interactions. Give your health care provider a list of all the medicines, herbs, non-prescription drugs, or dietary supplements you use. Also tell them if you smoke, drink alcohol, or use illegal drugs. Some items may interact with your medicine. What should I watch for while using this medication? Visit your care team for regular checks on your  progress. Check your blood pressure as directed. Know what your blood pressure should be and when to contact your care team. Your condition will be monitored carefully while you are receiving this medication. You may need blood work while taking this medication. What side effects may I notice from receiving this medication? Side effects that you should report to your care team as soon as possible: Allergic reactions--skin rash, itching, hives, swelling of the face, lips, tongue, or throat Blood clot--pain, swelling, or warmth in the leg, shortness of breath, chest pain Heart attack--pain or tightness in the chest, shoulders, arms, or jaw, nausea, shortness of breath, cold or clammy skin, feeling faint or lightheaded Increase in blood pressure Rash, fever, and swollen lymph nodes Redness, blistering, peeling, or loosening of the skin, including inside the mouth Seizures Stroke--sudden numbness or weakness of the face, arm, or leg, trouble speaking, confusion, trouble walking, loss of balance or coordination, dizziness, severe headache, change in vision Side effects that usually do not require medical attention (report to your care team if they continue or are bothersome): Bone, joint, or muscle pain Cough Headache Nausea Pain, redness, or irritation at injection site This list may not describe all possible side effects. Call your doctor for medical advice about side effects. You may report side effects to FDA at 1-800-FDA-1088. Where should I keep my medication? Keep out of the reach of children and pets. Store in a refrigerator. Do not freeze. Do not shake. Protect from light. Keep this medication in the original container until you are ready to take it. See product for storage information. Get rid of any unused medication after the expiration date. To get rid of medications that are no longer needed or have expired: Take the medication to a medication take-back program. Check with your  pharmacy or law enforcement to find a location. If you cannot return the medication, ask your pharmacist or care team how to get rid of the medication safely. NOTE: This sheet is a summary. It may not cover all possible information. If you have questions about this medicine, talk to your doctor, pharmacist, or health care provider.  2024 Elsevier/Gold Standard (2021-11-19 00:00:00)    To help prevent nausea and vomiting after your treatment, we encourage you to take your nausea medication as directed.  BELOW ARE SYMPTOMS THAT SHOULD BE REPORTED IMMEDIATELY: *FEVER GREATER THAN 100.4 F (38 C) OR HIGHER *CHILLS OR SWEATING *NAUSEA AND VOMITING THAT IS NOT CONTROLLED WITH YOUR NAUSEA MEDICATION *UNUSUAL SHORTNESS OF BREATH *UNUSUAL BRUISING OR BLEEDING *URINARY PROBLEMS (pain or burning when urinating, or frequent urination) *BOWEL PROBLEMS (unusual diarrhea, constipation, pain near the anus) TENDERNESS IN MOUTH AND THROAT WITH OR WITHOUT PRESENCE OF ULCERS (sore throat, sores in mouth, or a toothache) UNUSUAL RASH, SWELLING OR PAIN  UNUSUAL VAGINAL DISCHARGE OR ITCHING   Items with * indicate a potential emergency and should be followed up as soon as possible or go to the Emergency Department if any problems should  occur.  Please show the CHEMOTHERAPY ALERT CARD or IMMUNOTHERAPY ALERT CARD at check-in to the Emergency Department and triage nurse.  Should you have questions after your visit or need to cancel or reschedule your appointment, please contact [facility] [place] - A DEPT OF [facility] [phone]  and follow the prompts.  Office hours are 8:00 a.m. to 4:30 p.m. Monday - Friday. Please note that voicemails left after 4:00 p.m. may not be returned until the following business day.  We are closed weekends and major holidays. You have access to a nurse at all times for urgent questions. Please call the main number to the clinic [phone] and follow the  prompts.  For any non-urgent questions, you may also contact your provider using MyChart. We now offer e-Visits for anyone 73 and older to request care online for non-urgent symptoms. For details visit [email or web address].   Also download the MyChart app! Go to the app store, search "MyChart", open the app, select [place], and log in with your MyChart username and password.

## 2023-10-18 ENCOUNTER — Encounter: Payer: Self-pay | Admitting: Internal Medicine

## 2023-10-18 ENCOUNTER — Ambulatory Visit: Payer: Self-pay | Admitting: Internal Medicine

## 2023-10-18 VITALS — BP 131/77 | HR 60 | Ht 67.0 in | Wt 140.0 lb

## 2023-10-18 DIAGNOSIS — S066X0D Traumatic subarachnoid hemorrhage without loss of consciousness, subsequent encounter: Secondary | ICD-10-CM

## 2023-10-18 DIAGNOSIS — S065XAD Traumatic subdural hemorrhage with loss of consciousness status unknown, subsequent encounter: Secondary | ICD-10-CM

## 2023-10-18 DIAGNOSIS — N1832 Chronic kidney disease, stage 3b: Secondary | ICD-10-CM

## 2023-10-18 DIAGNOSIS — S065XAA Traumatic subdural hemorrhage with loss of consciousness status unknown, initial encounter: Secondary | ICD-10-CM

## 2023-10-18 DIAGNOSIS — Z09 Encounter for follow-up examination after completed treatment for conditions other than malignant neoplasm: Secondary | ICD-10-CM | POA: Diagnosis not present

## 2023-10-18 DIAGNOSIS — S066X0A Traumatic subarachnoid hemorrhage without loss of consciousness, initial encounter: Secondary | ICD-10-CM | POA: Insufficient documentation

## 2023-10-18 NOTE — Patient Instructions (Signed)
 Please follow up with Dr Kem Kays as scheduled. Please check with Dr Kem Kays for restarting Brillinta.  Please continue to take medications as prescribed.  Please continue to follow low salt diet and ambulate as tolerated.

## 2023-10-18 NOTE — Assessment & Plan Note (Addendum)
 Likely due to minor trauma at home Repeat CT head showed stable changes, did not require neurosurgical intervention Follow-up with neurosurgery clinic - advised to ask about resuming Brilinta due to history of ischemic CVA in the past Continue home PT/OT

## 2023-10-18 NOTE — Progress Notes (Signed)
 Established Patient Office Visit  Subjective:  Patient ID: Melvin Taylor, male    DOB: May 14, 1966  Age: 58 y.o. MRN: [id number]  CC:  Chief Complaint  Patient presents with   Follow-up    Hospital f/u     HPI BRAELYNN Taylor is a 58 y.o. 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  He was transferred from the Neuro ICU after stabilization of SDH and SAH with cerebral edema and brain compression. He was found down by family and noted to have right subdural hematoma, brain compression, and cerebral edema on OSH CTH and hypertensive emergency requiring nicardipine drip. He was transferred to [facility] Neuro ICU for further management and Neurosurgery evaluation. Nicardipine was stopped and he was resumed on his home medications with good blood pressure control. His home Brilinta was held. He was started on Keppra and continued on antiepileptics; he is now back on his home antiepileptics dosing. Repeat imaging was improved. NeuroSurgery did not feel surgery was needed but recommended continuing to hold all antiplatelet medications until follow up in the office with Dr. Park Breed in 4 weeks. PT/OT saw the patient and he was accepted to [facility]. He does live alone and has aides who assist a few hours a day, per his sister. Family is unable to provide 24hr care if needed. Retacrit dose, which was due, was given prior to discharge, which he has been getting per his Heme/Onc physician as an outpatient. He was discharged from [facility] on 10/11/23.  He is accompanied by his aide today.  His aide reports that he likely had rolled over from his bed and might have hit his head on the floor.  He was alert and at his baseline mental status when he found the patient on the floor.  Denies any episode of seizures since returning to home.  He has been participating in PT/OT at home.  Past Medical History:  Diagnosis Date    Bipolar 1 disorder (HCC)    depression/anxiety   Bone spur    left heel   Cervical radiculopathy    Chronic back pain    Chronic chest wall pain    Chronic neck pain    Coronary artery disease    a. s/p CABG in 03/2016 with LIMA-LAD, SVG-D1, SVG-RCA, and Seq SVG-mid and distal OM   Diabetes mellitus    Type II   Hallucinations    "long history of them"   Headache(784.0)    History of gout    HTN (hypertension)    Insomnia    Neuropathy    Pain management    Polysubstance abuse (HCC)    Right leg pain    chronic   Schizophrenia (HCC)    Seizures (HCC)    last sz between July 5-9th, 2016; epilepsy   Sleep apnea    Stroke (HCC)    Transfusion of blood product refused for religious reason     Past Surgical History:  Procedure Laterality Date   ANTERIOR CERVICAL DECOMP/DISCECTOMY FUSION N/A 12/14/2015   Procedure: ANTERIOR CERVICAL DECOMPRESSION/DISCECTOMY FUSION CERVICAL FIVE -SIX;  Surgeon: Julio Sicks, MD;  Location: [facility] NEURO ORS;  Service: Neurosurgery;  Laterality: N/A;   BIOPSY  05/07/2015   Procedure: BIOPSY (Gastric);  Surgeon: Corbin Ade, MD;  Location: [facility] ORS;  Service: Endoscopy;;   CARDIAC CATHETERIZATION N/A 03/07/2016   Procedure: Left Heart Cath and Coronary Angiography;  Surgeon:  Lyn Records, MD;  Location: [facility] INVASIVE CV LAB;  Service: Cardiovascular;  Laterality: N/A;   COLONOSCOPY WITH PROPOFOL N/A 05/07/2015   UEA:[id number] diverticulosis, multiple colon polyps removed, tubular adenoma, serrated colon polyp. Next colonoscopy October 2019   COLONOSCOPY WITH PROPOFOL N/A 08/02/2018   Procedure: COLONOSCOPY WITH PROPOFOL;  Surgeon: Corbin Ade, MD;  Location: [facility] ENDO SUITE;  Service: Endoscopy;  Laterality: N/A;  12:00pm   CORONARY ARTERY BYPASS GRAFT N/A 03/28/2016   Procedure: CORONARY ARTERY BYPASS GRAFTING (CABG) x 5 USING GREATER SAPHENOUS VEIN;  Surgeon: Alleen Borne, MD;  Location: [facility] OR;  Service: Open Heart Surgery;  Laterality: N/A;   ENDOVEIN  HARVEST OF GREATER SAPHENOUS VEIN Right 03/28/2016   Procedure: ENDOVEIN HARVEST OF GREATER SAPHENOUS VEIN;  Surgeon: Alleen Borne, MD;  Location: [facility] OR;  Service: Open Heart Surgery;  Laterality: Right;   ESOPHAGEAL DILATION N/A 05/07/2015   Procedure: ESOPHAGEAL DILATION WITH 56FR MALONEY DILATOR;  Surgeon: Corbin Ade, MD;  Location: [facility] ORS;  Service: Endoscopy;  Laterality: N/A;   ESOPHAGOGASTRODUODENOSCOPY (EGD) WITH PROPOFOL N/A 05/07/2015   RMR: Status post dilation of normal esophagus. Gastritis.   IR CT HEAD LTD  12/17/2020   IR CT HEAD LTD  12/17/2020   IR INTRA CRAN STENT  12/17/2020   IR PERCUTANEOUS ART THROMBECTOMY/INFUSION INTRACRANIAL INC DIAG ANGIO  12/17/2020   IR RADIOLOGIST EVAL & MGMT  03/25/2021   KNEE SURGERY Left    arthroscopy   MANDIBLE FRACTURE SURGERY     POLYPECTOMY  05/07/2015   Procedure: POLYPECTOMY (Hepatic Flexure, Distal Transverse Colon, Rectal);  Surgeon: Corbin Ade, MD;  Location: [facility] ORS;  Service: Endoscopy;;   RADIOLOGY WITH ANESTHESIA N/A 12/16/2020   Procedure: IR WITH ANESTHESIA;  Surgeon: Julieanne Cotton, MD;  Location: [facility] OR;  Service: Radiology;  Laterality: N/A;   TEE WITHOUT CARDIOVERSION N/A 03/28/2016   Procedure: TRANSESOPHAGEAL ECHOCARDIOGRAM (TEE);  Surgeon: Alleen Borne, MD;  Location: [facility] OR;  Service: Open Heart Surgery;  Laterality: N/A;   TONSILLECTOMY     TRIGGER FINGER RELEASE Left 10/15/2021   Procedure: RELEASE TRIGGER FINGER/A-1 PULLEY left middle or long finger;  Surgeon: Vickki Hearing, MD;  Location: [facility] ORS;  Service: Orthopedics;  Laterality: Left;    Family History  Problem Relation Age of Onset   Diabetes Father    Hypertension Father    Sleep apnea Father    Stroke Sister    Arthritis Other    Diabetes Other    Asthma Other     Social History   Socioeconomic History   Marital status: Single    Spouse name: Not on file   Number of children: Not on file   Years of education: 11th grade   Highest  education level: Not on file  Occupational History   Occupation: unemployed    Employer: NOT EMPLOYED  Tobacco Use   Smoking status: Former    Current packs/day: 0.50    Average packs/day: 0.5 packs/day for 30.0 years (15.0 ttl pk-yrs)    Types: Cigarettes   Smokeless tobacco: Never   Tobacco comments:    3 cigs per day  Vaping Use   Vaping status: Never Used  Substance and Sexual Activity   Alcohol use: Not Currently   Drug use: Not Currently    Types: Marijuana   Sexual activity: Not Currently  Other Topics Concern   Not on file  Social History Narrative   09/13/21 Lives alone, caregiver daily, manages meds  Drinks a cup of coffee a day   Social Drivers of Corporate investment banker Strain: Not on file  Food Insecurity: No Food Insecurity (09/26/2023)   Received [facility]   Hunger Vital Sign    Worried About Running Out of Food in the Last Year: Never true    Ran Out of Food in the Last Year: Never true  Transportation Needs: No Transportation Needs (09/26/2023)   Received from [facility] - Transportation    Lack of Transportation (Medical): No    Lack of Transportation (Non-Medical): No  Physical Activity: Not on file  Stress: Stress Concern Present (09/26/2023)   Received from [facility] - Occupational Stress Questionnaire    Feeling of Stress : Very much  Social Connections: Patient Unable To Answer (08/23/2023)   Social Connection and Isolation Panel [NHANES]    Frequency of Communication with Friends and Family: Patient unable to answer    Frequency of Social Gatherings with Friends and Family: Patient unable to answer    Attends Religious Services: Patient unable to answer    Active Member of Clubs or Organizations: Patient unable to answer    Attends Banker Meetings: Patient unable to answer    Marital Status: Patient unable to answer  Intimate Partner Violence: Not At Risk  (09/24/2023)   Received from [facility]   HITS    Over the last 12 months how often did your partner physically hurt you?: Never    Over the last 12 months how often did your partner insult you or talk down to you?: Never    Over the last 12 months how often did your partner threaten you with physical harm?: Never    Over the last 12 months how often did your partner scream or curse at you?: Never    Outpatient Medications Prior to Visit  Medication Sig Dispense Refill   ACCU-CHEK GUIDE test strip USE AS DIRECTED TO MONITOR BLOOD SUGAR (4) TIMES DAILY. 100 strip 6   acetaminophen (TYLENOL) 325 MG tablet Take 2 tablets (650 mg total) by mouth every 6 (six) hours as needed for mild pain (pain score 1-3) (or Fever >/= 101).     allopurinol (ZYLOPRIM) 100 MG tablet TAKE ONE TABLET BY MOUTH EVERY DAY 30 tablet 0   amLODipine (NORVASC) 10 MG tablet Take 10 mg by mouth daily.     atorvastatin (LIPITOR) 80 MG tablet TAKE 1 TABLET BY MOUTH ONCE DAILY. 30 tablet 0   Cholecalciferol (VITAMIN D3) 50 MCG (2000 UT) capsule Take 1 capsule (2,000 Units total) by mouth daily. 90 capsule 3   divalproex (DEPAKOTE) 250 MG DR tablet TAKE (1) TABLET BY MOUTH TWICE DAILY. 60 tablet 3   escitalopram (LEXAPRO) 20 MG tablet Take 1 tablet (20 mg total) by mouth daily. 30 tablet 0   ferrous sulfate 325 (65 FE) MG tablet Take 1 tablet (325 mg total) by mouth daily with breakfast. 30 tablet 3   isosorbide mononitrate (IMDUR) 30 MG 24 hr tablet TAKE ONE TABLET BY MOUTH EVERY DAY 30 tablet 0   lamoTRIgine (LAMICTAL) 25 MG tablet TAKE 2 TABLETS BY MOUTH TWICE DAILY. 120 tablet 5   Multiple Vitamin (MULTIVITAMIN WITH MINERALS) TABS tablet Take 1 tablet by mouth daily.     ticagrelor (BRILINTA) 90 MG TABS tablet Take 1 tablet (90 mg total) by mouth 2 (two) times daily. 60 tablet 5   UNABLE TO FIND  1 each by Does not apply route daily. Med Name: GLOVES 1B/M OR PRN 1 each 0   UNABLE TO FIND 1 each by Does not apply route  daily. Med Name: PULL UPS 100/M OR PRN 1 each 0   UNABLE TO FIND 1 each by Does not apply route daily. Med Name: NUTRITIONAL SUPPLEMENT 180U/M OR PRN 1 each 0   UNABLE TO FIND 1 each by Does not apply route daily. Med Name: UNDER PADS/CHUX 30/M OR PRN 1 each 0   Vitamin D, Ergocalciferol, (DRISDOL) 1.25 MG (50000 UNIT) CAPS capsule Take 1 capsule (50,000 Units total) by mouth every 7 (seven) days. 5 capsule 0   No facility-administered medications prior to visit.    Allergies  Allergen Reactions   Aspirin Itching, Swelling and Other (See Comments)    Facial swelling    ROS Review of Systems  Constitutional:  Negative for chills and fever.  HENT:  Negative for congestion and sore throat.   Eyes:  Negative for pain and discharge.  Respiratory:  Negative for cough and shortness of breath.   Cardiovascular:  Negative for chest pain and palpitations.  Gastrointestinal:  Negative for diarrhea, nausea and vomiting.  Endocrine: Negative for polydipsia and polyuria.  Genitourinary:  Negative for dysuria and hematuria.  Musculoskeletal:  Positive for arthralgias. Negative for neck pain and neck stiffness.  Skin:  Negative for rash.  Neurological:  Positive for weakness. Negative for dizziness and headaches.  Psychiatric/Behavioral:  Negative for agitation and behavioral problems.       Objective:    Physical Exam Vitals reviewed.  Constitutional:      General: He is not in acute distress.    Appearance: He is not diaphoretic.     Comments: In wheelchair  HENT:     Head: Normocephalic and atraumatic.     Nose: Nose normal.     Mouth/Throat:     Mouth: Mucous membranes are moist.  Eyes:     General: No scleral icterus.    Extraocular Movements: Extraocular movements intact.  Cardiovascular:     Rate and Rhythm: Regular rhythm.     Heart sounds: Normal heart sounds. No murmur heard. Pulmonary:     Breath sounds: Normal breath sounds. No wheezing or rales.  Musculoskeletal:      Cervical back: Neck supple. No tenderness.     Right lower leg: No edema.     Left lower leg: No edema.  Skin:    General: Skin is warm.     Findings: No rash.  Neurological:     General: No focal deficit present.     Mental Status: He is alert.     Sensory: No sensory deficit.     Motor: Weakness (RUE and RLE - 3/5, LUE and LLE - 4/5) present.     Comments: Alert and oriented to person and place, at baseline  Psychiatric:        Mood and Affect: Mood normal.        Behavior: Behavior normal.     BP 131/77   Pulse 60   Ht 5\' 7"  (1.702 m)   Wt 140 lb (63.5 kg)   SpO2 98%   BMI 21.93 kg/m  Wt Readings from Last 3 Encounters:  10/18/23 140 lb (63.5 kg)  09/15/23 128 lb (58.1 kg)  08/24/23 128 lb 12 oz (58.4 kg)    Lab Results  Component Value Date   TSH 2.703 03/02/2023   Lab Results  Component Value Date  WBC 5.1 10/13/2023   HGB 9.5 (L) 10/13/2023   HCT 29.6 (L) 10/13/2023   MCV 93.1 10/13/2023   PLT 253 10/13/2023   Lab Results  Component Value Date   NA 135 08/26/2023   K 4.3 08/26/2023   CO2 21 (L) 08/26/2023   GLUCOSE 85 08/26/2023   BUN 19 08/26/2023   CREATININE 1.50 (H) 08/26/2023   BILITOT 1.1 08/23/2023   ALKPHOS 48 08/23/2023   AST 30 08/23/2023   ALT 19 08/23/2023   PROT 6.6 08/23/2023   ALBUMIN 3.3 (L) 08/25/2023   CALCIUM 8.5 (L) 08/26/2023   ANIONGAP 8 08/26/2023   EGFR 50 (L) 08/05/2022   Lab Results  Component Value Date   CHOL 102 08/28/2022   Lab Results  Component Value Date   HDL 42 08/28/2022   Lab Results  Component Value Date   LDLCALC 46 08/28/2022   Lab Results  Component Value Date   TRIG 71 08/28/2022   Lab Results  Component Value Date   CHOLHDL 2.4 08/28/2022   Lab Results  Component Value Date   HGBA1C 5.8 (H) 08/23/2023      Assessment & Plan:   Problem List Items Addressed This Visit       Nervous and Auditory   Subdural hematoma (HCC) - Primary   Likely due to minor trauma at home Repeat CT  head showed stable changes, did not require neurosurgical intervention Follow-up with neurosurgery clinic - advised to ask about resuming Brilinta due to history of ischemic CVA in the past Continue home PT/OT      Relevant Orders   CBC with Differential/Platelet   CMP14+EGFR   Subarachnoid hemorrhage following injury, no loss of consciousness (HCC)   CT head showed subarachnoid hemorrhage with midline shift Patient did not lose consciousness, appeared to be at baseline mental status Was given Keppra for seizure prophylaxis, was placed back on Depakote and Lamictal upon discharge - no recent seizures BP wnl Likely due to minor trauma at home Follow-up with neurosurgery clinic - advised to ask about resuming Brilinta due to history of ischemic CVA in the past        Genitourinary   Stage 3b chronic kidney disease (CKD) (HCC)   Last BMP reviewed Check repeat CBC and CMP today Maintain adequate hydration      Relevant Orders   CBC with Differential/Platelet   CMP14+EGFR     Other   Hospital discharge follow-up   Hospital chart reviewed, including discharge summary Medications reconciled and reviewed with the patient in detail       No orders of the defined types were placed in this encounter.   Follow-up: Return if symptoms worsen or fail to improve.    Anabel Halon, MD

## 2023-10-18 NOTE — Assessment & Plan Note (Signed)
 Hospital chart reviewed, including discharge summary Medications reconciled and reviewed with the patient in detail

## 2023-10-18 NOTE — Assessment & Plan Note (Signed)
 Last BMP reviewed Check repeat CBC and CMP today Maintain adequate hydration

## 2023-10-18 NOTE — Assessment & Plan Note (Addendum)
 CT head showed subarachnoid hemorrhage with midline shift Patient did not lose consciousness, appeared to be at baseline mental status Was given Keppra for seizure prophylaxis, was placed back on Depakote and Lamictal upon discharge - no recent seizures BP wnl Likely due to minor trauma at home Follow-up with neurosurgery clinic - advised to ask about resuming Brilinta due to history of ischemic CVA in the past

## 2023-10-19 LAB — CMP14+EGFR
ALT: 14 IU/L (ref 0–44)
AST: 17 IU/L (ref 0–40)
Albumin: 3.9 g/dL (ref 3.8–4.9)
Alkaline Phosphatase: 67 IU/L (ref 44–121)
BUN/Creatinine Ratio: 9 (ref 9–20)
BUN: 11 mg/dL (ref 6–24)
Bilirubin Total: 0.4 mg/dL (ref 0.0–1.2)
CO2: 24 mmol/L (ref 20–29)
Calcium: 8.7 mg/dL (ref 8.7–10.2)
Chloride: 104 mmol/L (ref 96–106)
Creatinine, Ser: 1.18 mg/dL (ref 0.76–1.27)
Globulin, Total: 2 g/dL (ref 1.5–4.5)
Glucose: 64 mg/dL — ABNORMAL LOW (ref 70–99)
Potassium: 4.9 mmol/L (ref 3.5–5.2)
Sodium: 140 mmol/L (ref 134–144)
Total Protein: 5.9 g/dL — ABNORMAL LOW (ref 6.0–8.5)
eGFR: 71 mL/min/{1.73_m2} (ref >59)

## 2023-10-19 LAB — CBC WITH DIFFERENTIAL/PLATELET
Basophils Absolute: 0 10*3/uL (ref 0.0–0.2)
Basos: 1 %
EOS (ABSOLUTE): 0.1 10*3/uL (ref 0.0–0.4)
Eos: 2 %
Hematocrit: 31.3 % — ABNORMAL LOW (ref 37.5–51.0)
Hemoglobin: 10.1 g/dL — ABNORMAL LOW (ref 13.0–17.7)
Immature Grans (Abs): 0 10*3/uL (ref 0.0–0.1)
Immature Granulocytes: 0 %
Lymphocytes Absolute: 2.7 10*3/uL (ref 0.7–3.1)
Lymphs: 61 %
MCH: 29.9 pg (ref 26.6–33.0)
MCHC: 32.3 g/dL (ref 31.5–35.7)
MCV: 93 fL (ref 79–97)
Monocytes Absolute: 0.4 10*3/uL (ref 0.1–0.9)
Monocytes: 9 %
Neutrophils Absolute: 1.2 10*3/uL — ABNORMAL LOW (ref 1.4–7.0)
Neutrophils: 27 %
Platelets: 285 10*3/uL (ref 150–450)
RBC: 3.38 x10E6/uL — ABNORMAL LOW (ref 4.14–5.80)
RDW: 14.3 % (ref 11.6–15.4)
WBC: 4.4 10*3/uL (ref 3.4–10.8)

## 2023-10-20 NOTE — Addendum Note (Signed)
 Addended byTrena Platt on: 10/20/2023 07:41 AM   Modules accepted: Level of Service

## 2023-10-23 DIAGNOSIS — Z8673 Personal history of transient ischemic attack (TIA), and cerebral infarction without residual deficits: Secondary | ICD-10-CM

## 2023-10-23 DIAGNOSIS — Z556 Problems related to health literacy: Secondary | ICD-10-CM

## 2023-10-23 DIAGNOSIS — S065X0D Traumatic subdural hemorrhage without loss of consciousness, subsequent encounter: Secondary | ICD-10-CM | POA: Diagnosis not present

## 2023-10-23 DIAGNOSIS — I10 Essential (primary) hypertension: Secondary | ICD-10-CM | POA: Diagnosis not present

## 2023-10-23 DIAGNOSIS — F32A Depression, unspecified: Secondary | ICD-10-CM

## 2023-10-23 DIAGNOSIS — Z9181 History of falling: Secondary | ICD-10-CM

## 2023-10-23 DIAGNOSIS — E785 Hyperlipidemia, unspecified: Secondary | ICD-10-CM

## 2023-10-23 DIAGNOSIS — S066X0D Traumatic subarachnoid hemorrhage without loss of consciousness, subsequent encounter: Secondary | ICD-10-CM | POA: Diagnosis not present

## 2023-10-23 DIAGNOSIS — G935 Compression of brain: Secondary | ICD-10-CM | POA: Diagnosis not present

## 2023-10-23 DIAGNOSIS — M79642 Pain in left hand: Secondary | ICD-10-CM

## 2023-10-23 DIAGNOSIS — M109 Gout, unspecified: Secondary | ICD-10-CM

## 2023-10-24 ENCOUNTER — Telehealth: Payer: Self-pay | Admitting: Internal Medicine

## 2023-10-24 HISTORY — PX: BURR HOLE FOR SUBDURAL HEMATOMA: SHX1275

## 2023-10-24 NOTE — Telephone Encounter (Signed)
 Called patient for a diabetic eye exam, spoke to sister and asked to pass this information on to Dr Durwin Nora, He is currently at [facility] bleeding of the brian going in for a procedure this afternoon.

## 2023-10-25 ENCOUNTER — Other Ambulatory Visit: Payer: Self-pay

## 2023-10-25 DIAGNOSIS — N1832 Chronic kidney disease, stage 3b: Secondary | ICD-10-CM

## 2023-10-25 DIAGNOSIS — D638 Anemia in other chronic diseases classified elsewhere: Secondary | ICD-10-CM

## 2023-10-26 ENCOUNTER — Inpatient Hospital Stay: Payer: Medicaid Other

## 2023-10-26 ENCOUNTER — Inpatient Hospital Stay: Payer: Medicaid Other | Admitting: Oncology

## 2023-10-26 NOTE — Progress Notes (Deleted)
 [facility] [address] [place] [address]   Clinic Day:  03/02/2023  Referring physician: [name], MD  Patient Care Team: [name], MD as PCP - General (Internal Medicine) Laqueta Linden, MD (Inactive) as PCP - Cardiology (Cardiology) Jena Gauss Gerrit Friends, MD as Consulting Physician (Gastroenterology)  ASSEinSSMENT & PLAN:   Assessment:  1.  Normocytic anemia: - Patient seen at the request of Dr. 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 - Colonoscopy (08/02/2018): Diverticulosis in the sigmoid and descending colon.  Otherwise normal exam. - EGD (2016): Normal esophagus, s/p Maloney dilatation.  Abnormal gastric mucosa of uncertain significance, chronic active gastritis. - Epogen started on 11/23/2021, currently receiving 5000 units every 2 weeks - Received 3 infusions of Venofer 300 mg, last one on 01/06/2023 -Hematology workup from 03/02/2023 showed no evidence of nutritional deficiencies with normal iron levels, folate, B12 and copper.  LDH was slightly elevated at 200.  Protein and immunofixation electrophoresis did not reveal evidence of monoclonal protein with an unremarkable pattern.  No evidence of hemolysis.  2.  Social/family history: - Lives by himself.  He has about 10 hours of aide per day helps with day-to-day activities like cooking, taking shower as he had recurrent CVA.  He quit cigarette smoking more than 10 years ago. - Paternal great aunt had cancer.  Plan:  1.  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 -Labs from   07/07/2023 show a hemoglobin of 9.9 (11.2), the white blood cell count 3.9 (7.3) and a platelet count of 141 (218).  -Redraw all of iron levels on 05/23/2023 showed iron saturation 39%, ferritin 249 and vitamin B12 level 524.  MMA was 242. -Proceed with Retacrit 5000 units today.  2.  Pancytopenia: -We discussed  that not only did his hemoglobin dropped to 9.9, he is slightly neutropenic with white blood cell count of 3.9 and has thrombocytopenia with a platelet count of 141.  He has intermittent low levels of both cell lines in the past so at this point we will defer bone marrow biopsy but we did discuss in detail that if these further decline would recommend a bone marrow.  PLAN SUMMARY: >> Retacrit today.  >> RTC in every 2 weeks for labs and possible Retacrit based on results. >> Return to clinic in 16 weeks for labs, see MD +/- Retacrit.      No orders of the defined types were placed in this encounter.  I spent 25 minutes dedicated to the care of this patient (face-to-face and non-face-to-face) on the date of the encounter to include what is described in the assessment and plan.   Mauro Kaufmann, NP   3/27/20259:20 AM  CHIEF COMPLAINT/PURPOSE OF CONSULT:   Diagnosis: anemia  Current Therapy: Epogen and intermittent IV iron  HISTORY OF PRESENT ILLNESS:   Melvin Taylor is a 58 y.o. male presenting to clinic today for follow-up for anemia.  He was last seen in clinic by me on 03/16/2023 for follow-up and to review lab results.  Denies any interval hospitalizations, surgeries or changes to his baseline health.  Has chronic right knee pain and receives joint injections.  He is here today with his new aide.  Has occasional constipation and diarrhea.  Has intermittent headaches and dizziness when he stands.  He has trouble with his mobility due to his right knee.  Reports a nosebleed around  Thanksgiving.  States it was easily stopped.  He continues to deny any bleeding per rectum, melena, hematochezia, hematemesis, epistaxis or gingival bleeding.  Reports feeling more tired over the past 3 to 4 days.  Appetite is 80% energy levels are 50%.  PAST MEDICAL HISTORY:   Past Medical History: Past Medical History:  Diagnosis Date   Bipolar 1 disorder (HCC)    depression/anxiety   Bone spur     left heel   Cervical radiculopathy    Chronic back pain    Chronic chest wall pain    Chronic neck pain    Coronary artery disease    a. s/p CABG in 03/2016 with LIMA-LAD, SVG-D1, SVG-RCA, and Seq SVG-mid and distal OM   Diabetes mellitus    Type II   Hallucinations    "long history of them"   Headache(784.0)    History of gout    HTN (hypertension)    Insomnia    Neuropathy    Pain management    Polysubstance abuse (HCC)    Right leg pain    chronic   Schizophrenia (HCC)    Seizures (HCC)    last sz between July 5-9th, 2016; epilepsy   Sleep apnea    Stroke (HCC)    Transfusion of blood product refused for religious reason     Surgical History: Past Surgical History:  Procedure Laterality Date   ANTERIOR CERVICAL DECOMP/DISCECTOMY FUSION N/A 12/14/2015   Procedure: ANTERIOR CERVICAL DECOMPRESSION/DISCECTOMY FUSION CERVICAL FIVE -SIX;  Surgeon: Julio Sicks, MD;  Location: [facility] NEURO ORS;  Service: Neurosurgery;  Laterality: N/A;   BIOPSY  05/07/2015   Procedure: BIOPSY (Gastric);  Surgeon: Corbin Ade, MD;  Location: [facility] ORS;  Service: Endoscopy;;   CARDIAC CATHETERIZATION N/A 03/07/2016   Procedure: Left Heart Cath and Coronary Angiography;  Surgeon: Lyn Records, MD;  Location: [facility] INVASIVE CV LAB;  Service: Cardiovascular;  Laterality: N/A;   COLONOSCOPY WITH PROPOFOL N/A 05/07/2015   NWG:[id number] diverticulosis, multiple colon polyps removed, tubular adenoma, serrated colon polyp. Next colonoscopy October 2019   COLONOSCOPY WITH PROPOFOL N/A 08/02/2018   Procedure: COLONOSCOPY WITH PROPOFOL;  Surgeon: Corbin Ade, MD;  Location: [facility] ENDO SUITE;  Service: Endoscopy;  Laterality: N/A;  12:00pm   CORONARY ARTERY BYPASS GRAFT N/A 03/28/2016   Procedure: CORONARY ARTERY BYPASS GRAFTING (CABG) x 5 USING GREATER SAPHENOUS VEIN;  Surgeon: Alleen Borne, MD;  Location: [facility] OR;  Service: Open Heart Surgery;  Laterality: N/A;   ENDOVEIN HARVEST OF GREATER SAPHENOUS VEIN Right  03/28/2016   Procedure: ENDOVEIN HARVEST OF GREATER SAPHENOUS VEIN;  Surgeon: Alleen Borne, MD;  Location: [facility] OR;  Service: Open Heart Surgery;  Laterality: Right;   ESOPHAGEAL DILATION N/A 05/07/2015   Procedure: ESOPHAGEAL DILATION WITH 56FR MALONEY DILATOR;  Surgeon: Corbin Ade, MD;  Location: [facility] ORS;  Service: Endoscopy;  Laterality: N/A;   ESOPHAGOGASTRODUODENOSCOPY (EGD) WITH PROPOFOL N/A 05/07/2015   RMR: Status post dilation of normal esophagus. Gastritis.   IR CT HEAD LTD  12/17/2020   IR CT HEAD LTD  12/17/2020   IR INTRA CRAN STENT  12/17/2020   IR PERCUTANEOUS ART THROMBECTOMY/INFUSION INTRACRANIAL INC DIAG ANGIO  12/17/2020   IR RADIOLOGIST EVAL & MGMT  03/25/2021   KNEE SURGERY Left    arthroscopy   MANDIBLE FRACTURE SURGERY     POLYPECTOMY  05/07/2015   Procedure: POLYPECTOMY (Hepatic Flexure, Distal Transverse Colon, Rectal);  Surgeon: Corbin Ade, MD;  Location: [facility] ORS;  Service:  Endoscopy;;   RADIOLOGY WITH ANESTHESIA N/A 12/16/2020   Procedure: IR WITH ANESTHESIA;  Surgeon: Julieanne Cotton, MD;  Location: [facility] OR;  Service: Radiology;  Laterality: N/A;   TEE WITHOUT CARDIOVERSION N/A 03/28/2016   Procedure: TRANSESOPHAGEAL ECHOCARDIOGRAM (TEE);  Surgeon: Alleen Borne, MD;  Location: [facility] OR;  Service: Open Heart Surgery;  Laterality: N/A;   TONSILLECTOMY     TRIGGER FINGER RELEASE Left 10/15/2021   Procedure: RELEASE TRIGGER FINGER/A-1 PULLEY left middle or long finger;  Surgeon: Vickki Hearing, MD;  Location: [facility] ORS;  Service: Orthopedics;  Laterality: Left;    Social History: Social History   Socioeconomic History   Marital status: Single    Spouse name: Not on file   Number of children: Not on file   Years of education: 11th grade   Highest education level: Not on file  Occupational History   Occupation: unemployed    Employer: NOT EMPLOYED  Tobacco Use   Smoking status: Former    Current packs/day: 0.50    Average packs/day: 0.5 packs/day for 30.0  years (15.0 ttl pk-yrs)    Types: Cigarettes   Smokeless tobacco: Never   Tobacco comments:    3 cigs per day  Vaping Use   Vaping status: Never Used  Substance and Sexual Activity   Alcohol use: Not Currently   Drug use: Not Currently    Types: Marijuana   Sexual activity: Not Currently  Other Topics Concern   Not on file  Social History Narrative   09/13/21 Lives alone, caregiver daily, manages meds   Drinks a cup of coffee a day   Social Drivers of Corporate investment banker Strain: Not on file  Food Insecurity: No Food Insecurity (09/26/2023)   Received from [facility]   Hunger Vital Sign    Worried About Running Out of Food in the Last Year: Never true    Ran Out of Food in the Last Year: Never true  Transportation Needs: No Transportation Needs (10/24/2023)   Received from [facility] - Transportation    Lack of Transportation (Medical): No    Lack of Transportation (Non-Medical): No  Physical Activity: Not on file  Stress: Stress Concern Present (09/26/2023)   Received from [facility] - Occupational Stress Questionnaire    Feeling of Stress : Very much  Social Connections: Patient Unable To Answer (08/23/2023)   Social Connection and Isolation Panel [NHANES]    Frequency of Communication with Friends and Family: Patient unable to answer    Frequency of Social Gatherings with Friends and Family: Patient unable to answer    Attends Religious Services: Patient unable to answer    Active Member of Clubs or Organizations: Patient unable to answer    Attends Banker Meetings: Patient unable to answer    Marital Status: Patient unable to answer  Intimate Partner Violence: Not At Risk (10/23/2023)   Received from [facility]   HITS    Over the last 12 months how often did your partner physically hurt you?: Never    Over the last 12 months how often did your partner insult you or talk down to you?:  Never    Over the last 12 months how often did your partner threaten you with physical harm?: Never    Over the last 12 months how often did your partner scream or curse at you?: Never    Family History: Family History  Problem Relation Age of Onset   Diabetes Father    Hypertension Father    Sleep apnea Father    Stroke Sister    Arthritis Other    Diabetes Other    Asthma Other     Current Medications:  Current Outpatient Medications:    ACCU-CHEK GUIDE test strip, USE AS DIRECTED TO MONITOR BLOOD SUGAR (4) TIMES DAILY., Disp: 100 strip, Rfl: 6   acetaminophen (TYLENOL) 325 MG tablet, Take 2 tablets (650 mg total) by mouth every 6 (six) hours as needed for mild pain (pain score 1-3) (or Fever >/= 101)., Disp: , Rfl:    allopurinol (ZYLOPRIM) 100 MG tablet, TAKE ONE TABLET BY MOUTH EVERY DAY, Disp: 30 tablet, Rfl: 0   amLODipine (NORVASC) 10 MG tablet, Take 10 mg by mouth daily., Disp: , Rfl:    atorvastatin (LIPITOR) 80 MG tablet, TAKE 1 TABLET BY MOUTH ONCE DAILY., Disp: 30 tablet, Rfl: 0   Cholecalciferol (VITAMIN D3) 50 MCG (2000 UT) capsule, Take 1 capsule (2,000 Units total) by mouth daily., Disp: 90 capsule, Rfl: 3   divalproex (DEPAKOTE) 250 MG DR tablet, TAKE (1) TABLET BY MOUTH TWICE DAILY., Disp: 60 tablet, Rfl: 3   escitalopram (LEXAPRO) 20 MG tablet, Take 1 tablet (20 mg total) by mouth daily., Disp: 30 tablet, Rfl: 0   ferrous sulfate 325 (65 FE) MG tablet, Take 1 tablet (325 mg total) by mouth daily with breakfast., Disp: 30 tablet, Rfl: 3   isosorbide mononitrate (IMDUR) 30 MG 24 hr tablet, TAKE ONE TABLET BY MOUTH EVERY DAY, Disp: 30 tablet, Rfl: 0   lamoTRIgine (LAMICTAL) 25 MG tablet, TAKE 2 TABLETS BY MOUTH TWICE DAILY., Disp: 120 tablet, Rfl: 5   Multiple Vitamin (MULTIVITAMIN WITH MINERALS) TABS tablet, Take 1 tablet by mouth daily., Disp: , Rfl:    ticagrelor (BRILINTA) 90 MG TABS tablet, Take 1 tablet (90 mg total) by mouth 2 (two) times daily., Disp: 60  tablet, Rfl: 5   UNABLE TO FIND, 1 each by Does not apply route daily. Med Name: GLOVES 1B/M OR PRN, Disp: 1 each, Rfl: 0   UNABLE TO FIND, 1 each by Does not apply route daily. Med Name: PULL UPS 100/M OR PRN, Disp: 1 each, Rfl: 0   UNABLE TO FIND, 1 each by Does not apply route daily. Med Name: NUTRITIONAL SUPPLEMENT 180U/M OR PRN, Disp: 1 each, Rfl: 0   UNABLE TO FIND, 1 each by Does not apply route daily. Med Name: UNDER PADS/CHUX 30/M OR PRN, Disp: 1 each, Rfl: 0   Vitamin D, Ergocalciferol, (DRISDOL) 1.25 MG (50000 UNIT) CAPS capsule, Take 1 capsule (50,000 Units total) by mouth every 7 (seven) days., Disp: 5 capsule, Rfl: 0   Allergies: Allergies  Allergen Reactions   Aspirin Itching, Swelling and Other (See Comments)    Facial swelling    REVIEW OF SYSTEMS:   Review of Systems  Constitutional:  Positive for fatigue.  Gastrointestinal:  Positive for constipation and diarrhea. Negative for blood in stool.  Musculoskeletal:  Positive for arthralgias (Right knee pain).  Neurological:  Positive for dizziness and headaches.     VITALS:   There were no vitals taken for this visit.  Wt Readings from Last 3 Encounters:  10/18/23 140 lb (63.5 kg)  09/15/23 128 lb (58.1 kg)  08/24/23 128 lb 12 oz (58.4 kg)    There is no height or weight on file to calculate BMI.   PHYSICAL EXAM:   Physical Exam Constitutional:  Appearance: Normal appearance.  Cardiovascular:     Rate and Rhythm: Normal rate and regular rhythm.  Pulmonary:     Effort: Pulmonary effort is normal.     Breath sounds: Normal breath sounds.  Abdominal:     General: Bowel sounds are normal.     Palpations: Abdomen is soft.  Musculoskeletal:        General: No swelling. Normal range of motion.  Neurological:     Mental Status: He is alert and oriented to person, place, and time. Mental status is at baseline.     LABS:      Latest Ref Rng & Units 10/18/2023   11:31 AM 10/13/2023    9:32 AM 09/15/2023     9:24 AM  CBC  WBC 3.4 - 10.8 x10E3/uL 4.4  5.1  4.0   Hemoglobin 13.0 - 17.7 g/dL 16.1  9.5  09.6   Hematocrit 37.5 - 51.0 % 31.3  29.6  30.3   Platelets 150 - 450 x10E3/uL 285  253  157       Latest Ref Rng & Units 10/18/2023   11:31 AM 08/26/2023    5:30 AM 08/25/2023    4:20 AM  CMP  Glucose 70 - 99 mg/dL 64  85  045   BUN 6 - 24 mg/dL 11  19  15    Creatinine 0.76 - 1.27 mg/dL 4.09  8.11  9.14   Sodium 134 - 144 mmol/L 140  135  131   Potassium 3.5 - 5.2 mmol/L 4.9  4.3  4.0   Chloride 96 - 106 mmol/L 104  106  104   CO2 20 - 29 mmol/L 24  21  21    Calcium 8.7 - 10.2 mg/dL 8.7  8.5  8.3   Total Protein 6.0 - 8.5 g/dL 5.9     Total Bilirubin 0.0 - 1.2 mg/dL 0.4     Alkaline Phos 44 - 121 IU/L 67     AST 0 - 40 IU/L 17     ALT 0 - 44 IU/L 14        No results found for: "CEA1", "CEA" / No results found for: "CEA1", "CEA" No results found for: "PSA1" No results found for: "CAN199" No results found for: "CAN125"  Lab Results  Component Value Date   TOTALPROTELP 6.0 03/02/2023   TOTALPROTELP 6.0 03/02/2023   ALBUMINELP 3.8 03/02/2023   A1GS 0.2 03/02/2023   A2GS 0.3 (L) 03/02/2023   BETS 0.7 03/02/2023   GAMS 0.9 03/02/2023   MSPIKE Not Observed 03/02/2023   SPEI Comment 03/02/2023   Lab Results  Component Value Date   TIBC 290 05/23/2023   TIBC 313 03/02/2023   TIBC 274 11/10/2022   FERRITIN 249 05/23/2023   FERRITIN 220 03/02/2023   FERRITIN 70 11/10/2022   IRONPCTSAT 39 05/23/2023   IRONPCTSAT 48 (H) 03/02/2023   IRONPCTSAT 25 11/10/2022   Lab Results  Component Value Date   LDH 200 (H) 03/02/2023     STUDIES:   No results found.

## 2023-10-30 ENCOUNTER — Encounter: Payer: Self-pay | Admitting: Hematology

## 2023-11-01 ENCOUNTER — Ambulatory Visit: Admitting: Diagnostic Neuroimaging
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
## 2023-11-13 ENCOUNTER — Encounter: Payer: Self-pay | Admitting: Adult Health

## 2023-11-13 ENCOUNTER — Ambulatory Visit: Payer: Medicaid Other | Admitting: Adult Health

## 2023-11-13 VITALS — BP 109/67 | HR 59 | Ht 67.0 in | Wt 122.0 lb

## 2023-11-13 DIAGNOSIS — R58 Hemorrhage, not elsewhere classified: Secondary | ICD-10-CM | POA: Diagnosis not present

## 2023-11-13 DIAGNOSIS — I63512 Cerebral infarction due to unspecified occlusion or stenosis of left middle cerebral artery: Secondary | ICD-10-CM

## 2023-11-13 DIAGNOSIS — G40909 Epilepsy, unspecified, not intractable, without status epilepticus: Secondary | ICD-10-CM

## 2023-11-13 MED ORDER — LAMOTRIGINE 25 MG PO TABS: 50.0000 mg | ORAL_TABLET | Freq: Two times a day (BID) | ORAL | 11 refills | Status: AC

## 2023-11-13 MED ORDER — DIVALPROEX SODIUM 250 MG PO DR TAB: 250.0000 mg | DELAYED_RELEASE_TABLET | Freq: Two times a day (BID) | ORAL | 11 refills | Status: AC

## 2023-11-13 NOTE — Patient Instructions (Addendum)
 Your Plan:  Continue Depakote 250 mg twice daily and lamotrigine 50 mg twice daily for seizure prevention  Will reach out to your PCP to request obtaining lamotrigine level at tomorrows visit with your other lab work  Follow-up with neurosurgery as scheduled and discuss timeframe of restarting Brilinta  Please call with any seizure activity    Follow-up in 1 year or call earlier if needed     Thank you for coming to see us  at [facility]. I hope we have been able to provide you high quality care today.  You may receive a patient satisfaction survey over the next few weeks. We would appreciate your feedback and comments so that we may continue to improve ourselves and the health of our patients.

## 2023-11-13 NOTE — Progress Notes (Signed)
 Great, thank you so much!

## 2023-11-14 ENCOUNTER — Ambulatory Visit: Payer: Self-pay | Admitting: Internal Medicine

## 2023-11-14 ENCOUNTER — Encounter: Payer: Self-pay | Admitting: Internal Medicine

## 2023-11-14 VITALS — BP 101/67 | HR 64

## 2023-11-14 DIAGNOSIS — S065XAA Traumatic subdural hemorrhage with loss of consciousness status unknown, initial encounter: Secondary | ICD-10-CM

## 2023-11-14 DIAGNOSIS — G40909 Epilepsy, unspecified, not intractable, without status epilepticus: Secondary | ICD-10-CM | POA: Diagnosis not present

## 2023-11-14 DIAGNOSIS — S065XAD Traumatic subdural hemorrhage with loss of consciousness status unknown, subsequent encounter: Secondary | ICD-10-CM | POA: Diagnosis not present

## 2023-11-14 DIAGNOSIS — Z09 Encounter for follow-up examination after completed treatment for conditions other than malignant neoplasm: Secondary | ICD-10-CM

## 2023-11-14 DIAGNOSIS — D649 Anemia, unspecified: Secondary | ICD-10-CM

## 2023-11-14 NOTE — Assessment & Plan Note (Signed)
 Presents today for hospital follow-up in the setting of recent admission 3/24 - 4/1 in the setting of worsening SDH requiring bur holes 3/25.  Discharged from rehab facility 4/11.  He is feeling well today and taking medications as prescribed.  Will check Lamictal level today at neurology request.  Brilinta remains on hold.  Neurosurgery follow-up scheduled for 4/24.  He will return to care for routine follow-up with me on 5/2.

## 2023-11-14 NOTE — Patient Instructions (Signed)
 It was a pleasure to see you today.  Thank you for giving us  the opportunity to be involved in your care.  Below is a brief recap of your visit and next steps.  We will plan to see you again on 5/2.  [facility] No medication changes made. Continue to hold Brilinta Neurosurgery follow up on 4/24 Repeat labs ordered today

## 2023-11-14 NOTE — Progress Notes (Signed)
 Established Patient Office Visit  Subjective   Patient ID: Melvin Taylor, male    DOB: 04/20/1966  Age: 58 y.o. MRN: [id number]  Chief Complaint  Patient presents with   Hospitalization Follow-up    Hospital Follow up    [name] returns.  For hospital follow-up.  Recent hospital admission 3/24 - 4/1 after presenting to OSH with worsening confusion.  Found to have worsening subdural hematoma on CT head.  Previous history of hospital admission in the setting of subdural hematoma in February.  He was transferred to [facility] for evaluation by neurosurgery and underwent right bur holes for evacuation of subdural hematoma on 3/25.  Transferred to neuro ICU after his procedure.  Postoperative course was uncomplicated.  He was transition to oral medications, diet advanced, and discharged to [facility] rehab facility.  Discharged home 4/11.  Seen by neurology for follow-up yesterday (4/14).  No medication changes were made.  Neurosurgery follow-up is scheduled for 4/24.  Brilinta remains on hold in the setting of recent SDH. [name] reports feeling well today.  He is taking his medications as prescribed and has no additional concerns to discuss.  Past Medical History:  Diagnosis Date   Bipolar 1 disorder (HCC)    depression/anxiety   Bone spur    left heel   Cervical radiculopathy    Chronic back pain    Chronic chest wall pain    Chronic neck pain    Coronary artery disease    a. s/p CABG in 03/2016 with LIMA-LAD, SVG-D1, SVG-RCA, and Seq SVG-mid and distal OM   Diabetes mellitus    Type II   Hallucinations    "long history of them"   Headache(784.0)    History of gout    HTN (hypertension)    Insomnia    Neuropathy    Pain management    Polysubstance abuse (HCC)    Right leg pain    chronic   Schizophrenia (HCC)    Seizures (HCC)    last sz between July 5-9th, 2016; epilepsy   Sleep apnea    Stroke (HCC)    Transfusion of blood product refused for religious reason    Past  Surgical History:  Procedure Laterality Date   ANTERIOR CERVICAL DECOMP/DISCECTOMY FUSION N/A 12/14/2015   Procedure: ANTERIOR CERVICAL DECOMPRESSION/DISCECTOMY FUSION CERVICAL FIVE -SIX;  Surgeon: Julio Sicks, MD;  Location: [facility] NEURO ORS;  Service: Neurosurgery;  Laterality: N/A;   BIOPSY  05/07/2015   Procedure: BIOPSY (Gastric);  Surgeon: Corbin Ade, MD;  Location: [facility] ORS;  Service: Endoscopy;;   BURR HOLE FOR SUBDURAL HEMATOMA Right 10/24/2023   CARDIAC CATHETERIZATION N/A 03/07/2016   Procedure: Left Heart Cath and Coronary Angiography;  Surgeon: Lyn Records, MD;  Location: [facility] INVASIVE CV LAB;  Service: Cardiovascular;  Laterality: N/A;   COLONOSCOPY WITH PROPOFOL N/A 05/07/2015   WUJ:[id number] diverticulosis, multiple colon polyps removed, tubular adenoma, serrated colon polyp. Next colonoscopy October 2019   COLONOSCOPY WITH PROPOFOL N/A 08/02/2018   Procedure: COLONOSCOPY WITH PROPOFOL;  Surgeon: Corbin Ade, MD;  Location: [facility] END[facility] SUITE;  Service: Endoscopy;  Laterality: N/A;  12:00pm   CORONARY ARTERY BYPASS GRAFT N/A 03/28/2016   Procedure: CORONARY ARTERY BYPASS GRAFTING (CABG) x 5 USING GREATER SAPHENOUS VEIN;  Surgeon: Alleen Borne, MD;  Location: [facility] OR;  Service: Open Heart Surgery;  Laterality: N/A;   ENDOVEIN HARVEST OF GREATER SAPHENOUS VEIN Right 03/28/2016   Procedure: ENDOVEIN HARVEST OF GREATER SAPHENOUS VEIN;  Surgeon: Judie Grieve  Gerianne Kobs, MD;  Location: [facility] OR;  Service: Open Heart Surgery;  Laterality: Right;   ESOPHAGEAL DILATION N/A 05/07/2015   Procedure: ESOPHAGEAL DILATION WITH 56FR MALONEY DILATOR;  Surgeon: Suzette Espy, MD;  Location: [facility] ORS;  Service: Endoscopy;  Laterality: N/A;   ESOPHAGOGASTRODUODENOSCOPY (EGD) WITH PROPOFOL N/A 05/07/2015   RMR: Status post dilation of normal esophagus. Gastritis.   IR CT HEAD LTD  12/17/2020   IR CT HEAD LTD  12/17/2020   IR INTRA CRAN STENT  12/17/2020   IR PERCUTANEOUS ART THROMBECTOMY/INFUSION INTRACRANIAL INC  DIAG ANGIO  12/17/2020   IR RADIOLOGIST EVAL & MGMT  03/25/2021   KNEE SURGERY Left    arthroscopy   MANDIBLE FRACTURE SURGERY     POLYPECTOMY  05/07/2015   Procedure: POLYPECTOMY (Hepatic Flexure, Distal Transverse Colon, Rectal);  Surgeon: Suzette Espy, MD;  Location: [facility] ORS;  Service: Endoscopy;;   RADIOLOGY WITH ANESTHESIA N/A 12/16/2020   Procedure: IR WITH ANESTHESIA;  Surgeon: Luellen Sages, MD;  Location: [facility] OR;  Service: Radiology;  Laterality: N/A;   TEE WITHOUT CARDIOVERSION N/A 03/28/2016   Procedure: TRANSESOPHAGEAL ECHOCARDIOGRAM (TEE);  Surgeon: Bartley Lightning, MD;  Location: [facility] OR;  Service: Open Heart Surgery;  Laterality: N/A;   TONSILLECTOMY     TRIGGER FINGER RELEASE Left 10/15/2021   Procedure: RELEASE TRIGGER FINGER/A-1 PULLEY left middle or long finger;  Surgeon: Darrin Emerald, MD;  Location: [facility] ORS;  Service: Orthopedics;  Laterality: Left;   Social History   Tobacco Use   Smoking status: Former    Current packs/day: 0.50    Average packs/day: 0.5 packs/day for 30.0 years (15.0 ttl pk-yrs)    Types: Cigarettes   Smokeless tobacco: Never   Tobacco comments:    3 cigs per day  Vaping Use   Vaping status: Never Used  Substance Use Topics   Alcohol use: Not Currently   Drug use: Not Currently    Types: Marijuana   Family History  Problem Relation Age of Onset   Diabetes Father    Hypertension Father    Sleep apnea Father    Stroke Sister    Arthritis Other    Diabetes Other    Asthma Other    Allergies  Allergen Reactions   Aspirin Itching, Swelling and Other (See Comments)    Facial swelling   Review of Systems  Constitutional:  Negative for chills and fever.  HENT:  Negative for sore throat.   Respiratory:  Negative for cough and shortness of breath.   Cardiovascular:  Negative for chest pain, palpitations and leg swelling.  Gastrointestinal:  Negative for abdominal pain, blood in stool, constipation, diarrhea, nausea and  vomiting.  Genitourinary:  Negative for dysuria and hematuria.  Musculoskeletal:  Negative for myalgias.  Skin:  Negative for itching and rash.  Neurological:  Negative for dizziness and headaches.  Psychiatric/Behavioral:  Negative for depression and suicidal ideas.       Objective:     BP 101/67   Pulse 64   SpO2 97%  BP Readings from Last 3 Encounters:  11/14/23 101/67  11/13/23 109/67  10/18/23 131/77   Physical Exam Vitals reviewed.  Constitutional:      General: He is not in acute distress.    Appearance: Normal appearance. He is not ill-appearing.     Comments: Examined in wheelchair  HENT:     Head: Normocephalic and atraumatic.     Right Ear: External ear normal.     Left Ear:  External ear normal.     Nose: Nose normal. No congestion or rhinorrhea.     Mouth/Throat:     Mouth: Mucous membranes are moist.     Pharynx: Oropharynx is clear.  Eyes:     General: No scleral icterus.    Extraocular Movements: Extraocular movements intact.     Conjunctiva/sclera: Conjunctivae normal.     Pupils: Pupils are equal, round, and reactive to light.  Cardiovascular:     Rate and Rhythm: Normal rate and regular rhythm.     Pulses: Normal pulses.     Heart sounds: Normal heart sounds. No murmur heard. Pulmonary:     Effort: Pulmonary effort is normal.     Breath sounds: Normal breath sounds. No wheezing, rhonchi or rales.  Abdominal:     General: Abdomen is flat. Bowel sounds are normal. There is no distension.     Palpations: Abdomen is soft.     Tenderness: There is no abdominal tenderness.  Musculoskeletal:        General: No swelling or deformity. Normal range of motion.     Cervical back: Normal range of motion.     Right lower leg: No edema.     Left lower leg: No edema.  Skin:    General: Skin is warm and dry.     Capillary Refill: Capillary refill takes less than 2 seconds.     Comments: Well-healed surgical scar on the right side of skull  Neurological:      General: No focal deficit present.     Mental Status: He is alert and oriented to person, place, and time.     Cranial Nerves: No cranial nerve deficit.     Motor: Weakness (3/5 RUE) present.     Deep Tendon Reflexes: Reflexes normal.  Psychiatric:        Mood and Affect: Mood normal.        Behavior: Behavior normal.        Thought Content: Thought content normal.   Last CBC Lab Results  Component Value Date   WBC 4.4 10/18/2023   HGB 10.1 (L) 10/18/2023   HCT 31.3 (L) 10/18/2023   MCV 93 10/18/2023   MCH 29.9 10/18/2023   RDW 14.3 10/18/2023   PLT 285 10/18/2023   Last metabolic panel Lab Results  Component Value Date   GLUCOSE 64 (L) 10/18/2023   NA 140 10/18/2023   K 4.9 10/18/2023   CL 104 10/18/2023   CO2 24 10/18/2023   BUN 11 10/18/2023   CREATININE 1.18 10/18/2023   EGFR 71 10/18/2023   CALCIUM 8.7 10/18/2023   PHOS 3.0 08/25/2023   PROT 5.9 (L) 10/18/2023   ALBUMIN 3.9 10/18/2023   LABGLOB 2.0 10/18/2023   AGRATIO 1.7 03/02/2023   BILITOT 0.4 10/18/2023   ALKPHOS 67 10/18/2023   AST 17 10/18/2023   ALT 14 10/18/2023   ANIONGAP 8 08/26/2023   Last lipids Lab Results  Component Value Date   CHOL 102 08/28/2022   HDL 42 08/28/2022   LDLCALC 46 08/28/2022   TRIG 71 08/28/2022   CHOLHDL 2.4 08/28/2022   Last hemoglobin A1c Lab Results  Component Value Date   HGBA1C 5.8 (H) 08/23/2023   Last thyroid functions Lab Results  Component Value Date   TSH 2.703 03/02/2023   T4TOTAL 8.9 01/26/2022   Last vitamin D Lab Results  Component Value Date   VD25OH 90.2 08/05/2022   Last vitamin B12 and Folate Lab Results  Component Value Date  [id number] 05/23/2023   FOLATE 14.0 03/02/2023     Assessment & Plan:   Problem List Items Addressed This Visit       Subdural hematoma (HCC)   Presents today for hospital follow-up in the setting of recent admission 3/24 - 4/1 in the setting of worsening SDH requiring bur holes 3/25.  Discharged from  rehab facility 4/11.  He is feeling well today and taking medications as prescribed.  Will check Lamictal level today at neurology request.  Brilinta remains on hold.  Neurosurgery follow-up scheduled for 4/24.  He will return to care for routine follow-up with me on 5/2.      Return in about 17 days (around 12/01/2023).   Tobi Fortes, MD

## 2023-11-15 ENCOUNTER — Telehealth: Payer: Self-pay

## 2023-11-15 NOTE — Telephone Encounter (Signed)
 Copied from CRM [id number]. Topic: General - Other >> Nov 15, 2023 10:43 AM Emylou G wrote: Reason for CRM: Evalyn Hillier (lisa) Waneta Gut (sister) [phone] called has a few questions: When should he resume his ticagrelor (BRILINTA) 90 MG TABS tablet What is his current weight What as his sugar

## 2023-11-15 NOTE — Telephone Encounter (Signed)
 Spoke to sister

## 2023-11-16 ENCOUNTER — Telehealth: Payer: Self-pay | Admitting: Adult Health

## 2023-11-16 LAB — BASIC METABOLIC PANEL WITH GFR
BUN/Creatinine Ratio: 15 (ref 9–20)
BUN: 26 mg/dL — ABNORMAL HIGH (ref 6–24)
CO2: 25 mmol/L (ref 20–29)
Calcium: 9 mg/dL (ref 8.7–10.2)
Chloride: 105 mmol/L (ref 96–106)
Creatinine, Ser: 1.69 mg/dL — ABNORMAL HIGH (ref 0.76–1.27)
Glucose: 66 mg/dL — ABNORMAL LOW (ref 70–99)
Potassium: 4.8 mmol/L (ref 3.5–5.2)
Sodium: 140 mmol/L (ref 134–144)
eGFR: 46 mL/min/{1.73_m2} — ABNORMAL LOW (ref >59)

## 2023-11-16 LAB — CBC WITH DIFFERENTIAL/PLATELET
Basophils Absolute: 0 10*3/uL (ref 0.0–0.2)
Basos: 0 %
EOS (ABSOLUTE): 0.1 10*3/uL (ref 0.0–0.4)
Eos: 1 %
Hematocrit: 31 % — ABNORMAL LOW (ref 37.5–51.0)
Hemoglobin: 10 g/dL — ABNORMAL LOW (ref 13.0–17.7)
Immature Grans (Abs): 0 10*3/uL (ref 0.0–0.1)
Immature Granulocytes: 0 %
Lymphocytes Absolute: 2.7 10*3/uL (ref 0.7–3.1)
Lymphs: 63 %
MCH: 30.2 pg (ref 26.6–33.0)
MCHC: 32.3 g/dL (ref 31.5–35.7)
MCV: 94 fL (ref 79–97)
Monocytes Absolute: 0.4 10*3/uL (ref 0.1–0.9)
Monocytes: 10 %
Neutrophils Absolute: 1.1 10*3/uL — ABNORMAL LOW (ref 1.4–7.0)
Neutrophils: 26 %
Platelets: 180 10*3/uL (ref 150–450)
RBC: 3.31 x10E6/uL — ABNORMAL LOW (ref 4.14–5.80)
RDW: 14.1 % (ref 11.6–15.4)
WBC: 4.3 10*3/uL (ref 3.4–10.8)

## 2023-11-16 LAB — LAMOTRIGINE LEVEL: Lamotrigine Lvl: 7.9 ug/mL (ref 2.0–20.0)

## 2023-11-16 NOTE — Telephone Encounter (Signed)
 Spoke w/Pt sister regarding the recent labs. Pt sister stated they had received the results from Dr. Assunta Lax office. Pt sister inquired about Brilinta and if he should remain off or start back. Informed her to contact Pt PCP ofc regarding that medication. Pt sister stated understanding and thankful for the call.

## 2023-11-16 NOTE — Telephone Encounter (Addendum)
 [name] has returned call to [place], [employer]ity fundraiser.  She is asking for a call back

## 2023-11-16 NOTE — Telephone Encounter (Signed)
-----   Message from [name] sent at 11/16/2023  7:24 AM EDT ----- Dr. Kermit Ped, Suella Emmer thank you for obtaining that! I appreciate it!   POD 3 - can you please advise [name] that his recent lamotrigine level was satisfactory and to continue current dosage.  Thank you.

## 2023-11-16 NOTE — Telephone Encounter (Signed)
-----   Message from [name] sent at 11/16/2023  7:24 AM EDT ----- Dr. Kermit Ped, Suella Emmer thank you for obtaining that! I appreciate it!   POD 3 - can you please advise [name] that his recent lamotrigine level was satisfactory and to continue current dosage.  Thank you.

## 2023-11-17 ENCOUNTER — Telehealth: Payer: Self-pay

## 2023-11-17 NOTE — Telephone Encounter (Signed)
 Spoke to sister

## 2023-11-17 NOTE — Telephone Encounter (Signed)
 Copied from CRM [id number]. Topic: Clinical - Medication Question >> Nov 16, 2023 11:53 AM Rosamond Comes wrote: Reason for CRM: patient POA Alecia calling in. Medication questions, if he should start taking ticagrelor  (BRILINTA ) 90 MG TABS tablet  again. And asking about Plavix , that is not on patient medication list. Patient neurologist stated to call primary care provider to start these medications.   Please call Alecia  regarding this issue.  Alecia phone number [phone]

## 2023-11-22 ENCOUNTER — Ambulatory Visit: Payer: Medicaid Other | Admitting: Podiatry

## 2023-11-24 ENCOUNTER — Encounter: Payer: Self-pay | Admitting: Podiatry

## 2023-11-24 ENCOUNTER — Ambulatory Visit: Admitting: Podiatry

## 2023-11-24 DIAGNOSIS — M79675 Pain in left toe(s): Secondary | ICD-10-CM

## 2023-11-24 DIAGNOSIS — B351 Tinea unguium: Secondary | ICD-10-CM | POA: Diagnosis not present

## 2023-11-24 DIAGNOSIS — E1142 Type 2 diabetes mellitus with diabetic polyneuropathy: Secondary | ICD-10-CM | POA: Diagnosis not present

## 2023-11-24 DIAGNOSIS — M79674 Pain in right toe(s): Secondary | ICD-10-CM | POA: Diagnosis not present

## 2023-11-24 NOTE — Progress Notes (Signed)
 This patient returns to my office for at risk foot care.  This patient requires this care by a professional since this patient will be at risk due to having diabetic neuropathy.  This patient is unable to cut nails himself since the patient cannot reach his nails.These nails are painful walking and wearing shoes.  This patient presents for at risk foot care today.  General Appearance  Alert, conversant and in no acute stress.  Vascular  Dorsalis pedis and posterior tibial  pulses are palpable  bilaterally.  Capillary return is within normal limits  bilaterally. Temperature is within normal limits  bilaterally.  Neurologic  Senn-Weinstein monofilament wire test within normal limits  bilaterally. Muscle power within normal limits bilaterally.  Nails Thick disfigured discolored nails with subungual debris  from hallux to fifth toes bilaterally. No evidence of bacterial infection or drainage bilaterally.  Orthopedic  No limitations of motion  feet .  No crepitus or effusions noted.  No bony pathology or digital deformities noted.  Skin  normotropic skin with no porokeratosis noted bilaterally.  No signs of infections or ulcers noted.     Onychomycosis  Pain in right toes  Pain in left toes  Consent was obtained for treatment procedures.   Mechanical debridement of nails 1-5  bilaterally performed with a nail nipper.  Filed with dremel without incident.    Return office visit   3 months                   Told patient to return for periodic foot care and evaluation due to potential at risk complications.   Helane Gunther DPM

## 2023-12-01 ENCOUNTER — Ambulatory Visit: Payer: Medicaid Other | Admitting: Internal Medicine

## 2023-12-02 NOTE — Progress Notes (Unsigned)
 GI Office Note    Referring Provider: Tobi Fortes, MD Primary Care Physician:  Tobi Fortes, MD  Primary Gastroenterologist: Rheba Cedar, MD   Chief Complaint   No chief complaint on file.   History of Present Illness   Melvin Taylor is a 58 y.o. male presenting today for follow up. Last seen 01/2023. Seen at that time for anemia at the request of Dr. Carrolyn Clan.   Patient has a history of prior CVAs including ischemic stroke status post left MCA stent placement, chronically on Brilinta . H/o seizures.  Earlier this year he had subdural hematoma.   He has had several hospitalizations this year. Admitted in early 08/2022 with Covid. Punctate CVA on imaging with subdural hematoma developing during hospitalization, 4mm in left occiptal region. Most recent hospitalization in 11/2022 for with altered MS with elevated ammonia levels in setting of depakote  usage. Started on lactulose . Seen by neurology. Evaluated by ST in 09/2022, recommended dysphagia 2 with nectar thick liquids.    Patient has history of chronic anemia, anemia of chronic disease with IDA, receives Procrit  and iron  infusions.    Patient was scheduled to see me in 08/2022, he left without being seen as his caregiver had to leave. His Hgb was 6.3 at that time. Refused blood products due to being [employer]. Patient did not reschedule ov.  After last ov, we discussed doing colonoscopy+/-EGD, we received clearance from neurology but his work up has been delayed by other health issues. Hospitalized in 08/2023 with covid.   He had brain bleed in 09/2023, hospitalized at [facility]. He required burr holes for right subdural hematoma. He had recurrent bleeding. He is off Brilinta  and all antiplatelet/anticoagulant agents. Referred to Dr. Arleen Bells for right MMA embo.   Labs***  Colonoscopy January 2020: -Diverticulosis -Next colonoscopy in 5 years due to history of adenomatous colon polyps.   EGD 2016: -Normal  esophagus status post Maloney dilation -Abnormal gastric mucosa of uncertain significance, chronic active gastritis, no H. pylori -Bulbar erosions   Medications   Current Outpatient Medications  Medication Sig Dispense Refill   ACCU-CHEK GUIDE test strip USE AS DIRECTED TO MONITOR BLOOD SUGAR (4) TIMES DAILY. 100 strip 6   acetaminophen  (TYLENOL ) 325 MG tablet Take 2 tablets (650 mg total) by mouth every 6 (six) hours as needed for mild pain (pain score 1-3) (or Fever >/= 101).     allopurinol  (ZYLOPRIM ) 100 MG tablet TAKE ONE TABLET BY MOUTH EVERY DAY 30 tablet 0   amLODipine  (NORVASC ) 10 MG tablet Take 10 mg by mouth daily.     atorvastatin  (LIPITOR ) 80 MG tablet TAKE 1 TABLET BY MOUTH ONCE DAILY. 30 tablet 0   Cholecalciferol (VITAMIN D3) 50 MCG (2000 UT) capsule Take 1 capsule (2,000 Units total) by mouth daily. 90 capsule 3   divalproex  (DEPAKOTE ) 250 MG DR tablet Take 1 tablet (250 mg total) by mouth 2 (two) times daily. 60 tablet 11   escitalopram  (LEXAPRO ) 20 MG tablet Take 1 tablet (20 mg total) by mouth daily. 30 tablet 0   ferrous sulfate  325 (65 FE) MG tablet Take 1 tablet (325 mg total) by mouth daily with breakfast. 30 tablet 3   isosorbide  mononitrate (IMDUR ) 30 MG 24 hr tablet TAKE ONE TABLET BY MOUTH EVERY DAY 30 tablet 0   lamoTRIgine  (LAMICTAL ) 25 MG tablet Take 2 tablets (50 mg total) by mouth 2 (two) times daily. 120 tablet 11   Multiple Vitamin (MULTIVITAMIN WITH MINERALS) TABS  tablet Take 1 tablet by mouth daily.     ticagrelor  (BRILINTA ) 90 MG TABS tablet Take 1 tablet (90 mg total) by mouth 2 (two) times daily. 60 tablet 5   UNABLE TO FIND 1 each by Does not apply route daily. Med Name: GLOVES 1B/M OR PRN 1 each 0   UNABLE TO FIND 1 each by Does not apply route daily. Med Name: PULL UPS 100/M OR PRN 1 each 0   UNABLE TO FIND 1 each by Does not apply route daily. Med Name: NUTRITIONAL SUPPLEMENT 180U/M OR PRN 1 each 0   UNABLE TO FIND 1 each by Does not apply route  daily. Med Name: UNDER PADS/CHUX 30/M OR PRN 1 each 0   Vitamin D , Ergocalciferol , (DRISDOL ) 1.25 MG (50000 UNIT) CAPS capsule Take 1 capsule (50,000 Units total) by mouth every 7 (seven) days. 5 capsule 0   No current facility-administered medications for this visit.    Allergies   Allergies as of 12/04/2023 - Review Complete 11/24/2023  Allergen Reaction Noted   Aspirin  Itching, Swelling, and Other (See Comments) 12/28/2010     Past Medical History   Past Medical History:  Diagnosis Date   Bipolar 1 disorder (HCC)    depression/anxiety   Bone spur    left heel   Cervical radiculopathy    Chronic back pain    Chronic chest wall pain    Chronic neck pain    Coronary artery disease    a. s/p CABG in 03/2016 with LIMA-LAD, SVG-D1, SVG-RCA, and Seq SVG-mid and distal OM   Diabetes mellitus    Type II   Hallucinations    "long history of them"   Headache(784.0)    History of gout    HTN (hypertension)    Insomnia    Neuropathy    Pain management    Polysubstance abuse (HCC)    Right leg pain    chronic   Schizophrenia (HCC)    Seizures (HCC)    last sz between July 5-9th, 2016; epilepsy   Sleep apnea    Stroke (HCC)    Transfusion of blood product refused for religious reason     Past Surgical History   Past Surgical History:  Procedure Laterality Date   ANTERIOR CERVICAL DECOMP/DISCECTOMY FUSION N/A 12/14/2015   Procedure: ANTERIOR CERVICAL DECOMPRESSION/DISCECTOMY FUSION CERVICAL FIVE -SIX;  Surgeon: Agustina Aldrich, MD;  Location: [facility] NEURO ORS;  Service: Neurosurgery;  Laterality: N/A;   BIOPSY  05/07/2015   Procedure: BIOPSY (Gastric);  Surgeon: Suzette Espy, MD;  Location: [facility] ORS;  Service: Endoscopy;;   BURR HOLE FOR SUBDURAL HEMATOMA Right 10/24/2023   CARDIAC CATHETERIZATION N/A 03/07/2016   Procedure: Left Heart Cath and Coronary Angiography;  Surgeon: Arty Binning, MD;  Location: [facility] INVASIVE CV LAB;  Service: Cardiovascular;  Laterality: N/A;    COLONOSCOPY WITH PROPOFOL  N/A 05/07/2015   [id number] diverticulosis, multiple colon polyps removed, tubular adenoma, serrated colon polyp. Next colonoscopy October 2019   COLONOSCOPY WITH PROPOFOL  N/A 08/02/2018   Procedure: COLONOSCOPY WITH PROPOFOL ;  Surgeon: Suzette Espy, MD;  Location: [facility] ENDO SUITE;  Service: Endoscopy;  Laterality: N/A;  12:00pm   CORONARY ARTERY BYPASS GRAFT N/A 03/28/2016   Procedure: CORONARY ARTERY BYPASS GRAFTING (CABG) x 5 USING GREATER SAPHENOUS VEIN;  Surgeon: Bartley Lightning, MD;  Location: [facility] OR;  Service: Open Heart Surgery;  Laterality: N/A;   ENDOVEIN HARVEST OF GREATER SAPHENOUS VEIN Right 03/28/2016   Procedure: ENDOVEIN HARVEST OF GREATER SAPHENOUS  VEIN;  Surgeon: Bartley Lightning, MD;  Location: [facility] OR;  Service: Open Heart Surgery;  Laterality: Right;   ESOPHAGEAL DILATION N/A 05/07/2015   Procedure: ESOPHAGEAL DILATION WITH 56FR MALONEY DILATOR;  Surgeon: Suzette Espy, MD;  Location: [facility] ORS;  Service: Endoscopy;  Laterality: N/A;   ESOPHAGOGASTRODUODENOSCOPY (EGD) WITH PROPOFOL  N/A 05/07/2015   RMR: Status post dilation of normal esophagus. Gastritis.   IR CT HEAD LTD  12/17/2020   IR CT HEAD LTD  12/17/2020   IR INTRA CRAN STENT  12/17/2020   IR PERCUTANEOUS ART THROMBECTOMY/INFUSION INTRACRANIAL INC DIAG ANGIO  12/17/2020   IR RADIOLOGIST EVAL & MGMT  03/25/2021   KNEE SURGERY Left    arthroscopy   MANDIBLE FRACTURE SURGERY     POLYPECTOMY  05/07/2015   Procedure: POLYPECTOMY (Hepatic Flexure, Distal Transverse Colon, Rectal);  Surgeon: Suzette Espy, MD;  Location: [facility] ORS;  Service: Endoscopy;;   RADIOLOGY WITH ANESTHESIA N/A 12/16/2020   Procedure: IR WITH ANESTHESIA;  Surgeon: Luellen Sages, MD;  Location[facility] OR;  Service: Radiology;  Laterality: N/A;   TEE WITHOUT CARDIOVERSION N/A 03/28/2016   Procedure: TRANSESOPHAGEAL ECHOCARDIOGRAM (TEE);  Surgeon: Bartley Lightning, MD;  Location: [facility] OR;  Service: Open Heart Surgery;  Laterality:  N/A;   TONSILLECTOMY     TRIGGER FINGER RELEASE Left 10/15/2021   Procedure: RELEASE TRIGGER FINGER/A-1 PULLEY left middle or long finger;  Surgeon: Darrin Emerald, MD;  Location: [facility] ORS;  Service: Orthopedics;  Laterality: Left;    Past Family History   Family History  Problem Relation Age of Onset   Diabetes Father    Hypertension Father    Sleep apnea Father    Stroke Sister    Arthritis Other    Diabetes Other    Asthma Other     Past Social History   Social History   Socioeconomic History   Marital status: Single    Spouse name: Not on file   Number of children: Not on file   Years of education: 11th grade   Highest education level: Not on file  Occupational History   Occupation: unemployed    Employer: NOT EMPLOYED  Tobacco Use   Smoking status: Former    Current packs/day: 0.50    Average packs/day: 0.5 packs/day for 30.0 years (15.0 ttl pk-yrs)    Types: Cigarettes   Smokeless tobacco: Never   Tobacco comments:    3 cigs per day  Vaping Use   Vaping status: Never Used  Substance and Sexual Activity   Alcohol use: Not Currently   Drug use: Not Currently    Types: Marijuana   Sexual activity: Not Currently  Other Topics Concern   Not on file  Social History Narrative   09/13/21 Lives alone, caregiver daily, manages meds   Drinks a cup of coffee a day   Social Drivers of Health   Financial Resource Strain: Patient Declined (11/23/2023)   Received from [facility]   Overall Financial Resource Strain (CARDIA)    Difficulty of Paying Living Expenses: Patient declined  Food Insecurity: Patient Declined (11/23/2023)   Received from [facility]   Hunger Vital Sign    Worried About Running Out of Food in the Last Year: Patient declined    Ran Out of Food in the Last Year: Patient declined  Transportation Needs: Patient Declined (11/23/2023)   Received from [facility] - Transportation    Lack of Transportation (Medical): Patient  declined  Lack of Transportation (Non-Medical): Patient declined  Physical Activity: Not on file  Stress: Stress Concern Present (10/28/2023)   Received from [facility] - Occupational Stress Questionnaire    Feeling of Stress : Very much  Social Connections: Patient Unable To Answer (08/23/2023)   Social Connection and Isolation Panel [NHANES]    Frequency of Communication with Friends and Family: Patient unable to answer    Frequency of Social Gatherings with Friends and Family: Patient unable to answer    Attends Religious Services: Patient unable to answer    Active Member of Clubs or Organizations: Patient unable to answer    Attends Banker Meetings: Patient unable to answer    Marital Status: Patient unable to answer  Intimate Partner Violence: Not At Risk (10/23/2023)   Received from [facility]   HITS    Over the last 12 months how often did your partner physically hurt you?: Never    Over the last 12 months how often did your partner insult you or talk down to you?: Never    Over the last 12 months how often did your partner threaten you with physical harm?: Never    Over the last 12 months how often did your partner scream or curse at you?: Never    Review of Systems   General: Negative for anorexia, weight loss, fever, chills, fatigue, weakness. ENT: Negative for hoarseness, difficulty swallowing , nasal congestion. CV: Negative for chest pain, angina, palpitations, dyspnea on exertion, peripheral edema.  Respiratory: Negative for dyspnea at rest, dyspnea on exertion, cough, sputum, wheezing.  GI: See history of present illness. GU:  Negative for dysuria, hematuria, urinary incontinence, urinary frequency, nocturnal urination.  Endo: Negative for unusual weight change.     Physical Exam   There were no vitals taken for this visit.   General: Well-nourished, well-developed in no acute distress.  Eyes: No  icterus. Mouth: Oropharyngeal mucosa moist and pink , no lesions erythema or exudate. Lungs: Clear to auscultation bilaterally.  Heart: Regular rate and rhythm, no murmurs rubs or gallops.  Abdomen: Bowel sounds are normal, nontender, nondistended, no hepatosplenomegaly or masses,  no abdominal bruits or hernia , no rebound or guarding.  Rectal: ***  Extremities: No lower extremity edema. No clubbing or deformities. Neuro: Alert and oriented x 4   Skin: Warm and dry, no jaundice.   Psych: Alert and cooperative, normal mood and affect.  Labs   *** Imaging Studies   No results found.  Assessment       PLAN   ***   Trudie Fuse. Harles Lied, MHS, PA-C [facility]

## 2023-12-04 ENCOUNTER — Encounter: Payer: Self-pay | Admitting: Gastroenterology

## 2023-12-04 ENCOUNTER — Ambulatory Visit (INDEPENDENT_AMBULATORY_CARE_PROVIDER_SITE_OTHER): Admitting: Gastroenterology

## 2023-12-04 VITALS — BP 127/79 | HR 56 | Temp 97.5°F

## 2023-12-04 DIAGNOSIS — Z860101 Personal history of adenomatous and serrated colon polyps: Secondary | ICD-10-CM

## 2023-12-04 DIAGNOSIS — D638 Anemia in other chronic diseases classified elsewhere: Secondary | ICD-10-CM

## 2023-12-04 DIAGNOSIS — D509 Iron deficiency anemia, unspecified: Secondary | ICD-10-CM | POA: Diagnosis not present

## 2023-12-04 DIAGNOSIS — D649 Anemia, unspecified: Secondary | ICD-10-CM

## 2023-12-04 DIAGNOSIS — S065X0A Traumatic subdural hemorrhage without loss of consciousness, initial encounter: Secondary | ICD-10-CM

## 2023-12-04 DIAGNOSIS — Z860100 Personal history of colon polyps, unspecified: Secondary | ICD-10-CM

## 2023-12-04 DIAGNOSIS — S065XAA Traumatic subdural hemorrhage with loss of consciousness status unknown, initial encounter: Secondary | ICD-10-CM

## 2023-12-04 HISTORY — DX: Iron deficiency anemia, unspecified: D50.9

## 2023-12-04 NOTE — Patient Instructions (Addendum)
 We will need to hold off colonoscopy and possible upper endoscopy to evaluate your anemia until you have been cleared by your doctors from your subdural hematoma (bleeding on the brain).   Please reach out when you have been released by your neurosurgeon.   I will also follow your progress and reach out once they complete treatment.

## 2023-12-08 ENCOUNTER — Ambulatory Visit: Payer: Self-pay

## 2023-12-08 ENCOUNTER — Encounter

## 2023-12-08 NOTE — Telephone Encounter (Signed)
 Appointment scheduled.

## 2023-12-08 NOTE — Telephone Encounter (Signed)
  Chief Complaint: leg pain Symptoms: Leg pain right side considerably worse Frequency: worsening this week Pertinent Negatives: Patient denies  Disposition: [] ED /[] Urgent Care (no appt availability in office) / [x] Appointment(In office/virtual)/ []  [facility]/ [] Home Care/ [] Refused Recommended Disposition /[] [facility]/ []  Follow-up with PCP Additional Notes: Call from Leah at [facility]. She states that pt is complaining about right knee pain. Pt states that both legs hurt, but right side is worse. Per Leah pain seems to be nerve pain in nature. She also states that leg muscles are very tight. Pt needs 1 week notice in order to schedule transportation into office. Scheduled MyChart visit for this afternoon.  Pt may need additional help with getting on video call.    Copied from CRM [id number]. Topic: Clinical - Red Word Triage >> Dec 08, 2023 10:05 AM Stanly Early wrote: Red Word that prompted transfer to Nurse Triage: patient reported pain in both legs but right leg is worse. Its affecting him to do physical therapy Reason for Disposition  [1] SEVERE pain (e.g., excruciating, unable to do any normal activities) AND [2] not improved after 2 hours of pain medicine  Answer Assessment - Initial Assessment Questions 1. ONSET: "When did the pain start?"      This week more intense. 2. LOCATION: "Where is the pain located?"      More in the right leg -  3. PAIN: "How bad is the pain?"    (Scale 1-10; or mild, moderate, severe)   -  MILD (1-3): doesn't interfere with normal activities    -  MODERATE (4-7): interferes with normal activities (e.g., work or school) or awakens from sleep, limping    -  SEVERE (8-10): excruciating pain, unable to do any normal activities, unable to walk     8-9/10 with any movement 4. WORK OR EXERCISE: "Has there been any recent work or exercise that involved this part of the body?"      no 5. CAUSE: "What do you think is causing the  leg pain?"     sciatica 6. OTHER SYMPTOMS: "Do you have any other symptoms?" (e.g., chest pain, back pain, breathing difficulty, swelling, rash, fever, numbness, weakness)     Muscles are tight in leg.  Protocols used: Leg Pain-A-AH

## 2023-12-08 NOTE — Progress Notes (Signed)
 This encounter was created in error - please disregard.

## 2023-12-11 ENCOUNTER — Ambulatory Visit: Payer: Self-pay | Admitting: *Deleted

## 2023-12-11 NOTE — Telephone Encounter (Signed)
 Patient scheduled 12/13/2023 with Melvin Taylor due to Dr Kermit Ped being out of office

## 2023-12-11 NOTE — Telephone Encounter (Signed)
 Caller was patient's sister, [name]" on DPR Chief Complaint: right leg pain continues  Symptoms: right leg pain , reports nerve pain instead of joint pain per patient sister per PT Frequency: 1 week  Pertinent Negatives: Patient denies na  Disposition: [] ED /[] Urgent Care (no appt availability in office) / [x] Appointment(In office/virtual)/ []  [facility]/ [] Home Care/ [] Refused Recommended Disposition /[] [facility]/ []  Follow-up with PCP Additional Notes:   Patient sister calling to confirm appt scheduled for 12/13/23 and no appt noted for that date with PCP. Pt sister reports due to transportation issues patient was scheduled for VV 12/08/23 and waited on call for more than 20 minutes and provider never showed on appt.  [name] concerned if patient sees another provider they may not know pt hx for kidney disease and does not want patient prescribed narcotics due to hx drug abuse and addiction. No available appt with PCP until June. Sister agreed to in office appt for 12/13/23 due to she already set up [facility] to pick up patient for "the other appt" that she thought was scheduled for the 14th. This RN scheduled appt for 12/13/23 with other provider and sister agreed. Please advise.       Copied from CRM [id number]. Topic: Clinical - Red Word Triage >> Dec 11, 2023  8:54 AM Hamdi H wrote: Red Word that prompted transfer to Nurse Triage: Leg pain, possible nerve pain instead of joint pain according to PT. Reason for Disposition  [1] MODERATE pain (e.g., interferes with normal activities, limping) AND [2] present > 3 days  Answer Assessment - Initial Assessment Questions 1. ONSET: "When did the pain start?"      On going pain per patient sister [name] on [place]  2. LOCATION: "Where is the pain located?"      Right leg  3. PAIN: "How bad is the pain?"    (Scale 1-10; or mild, moderate, severe)   -  MILD (1-3): doesn't interfere with normal activities    -  MODERATE (4-7):  interferes with normal activities (e.g., work or school) or awakens from sleep, limping    -  SEVERE (8-10): excruciating pain, unable to do any normal activities, unable to walk     Injections not helping  4. WORK OR EXERCISE: "Has there been any recent work or exercise that involved this part of the body?"      No but does do therapy 5. CAUSE: "What do you think is causing the leg pain?"     Nerve pain  6. OTHER SYMPTOMS: "Do you have any other symptoms?" (e.g., chest pain, back pain, breathing difficulty, swelling, rash, fever, numbness, weakness)     Right leg pain not joint pain 7. PREGNANCY: "Is there any chance you are pregnant?" "When was your last menstrual period?"     na  Protocols used: Leg Pain-A-AH

## 2023-12-12 ENCOUNTER — Other Ambulatory Visit: Payer: Self-pay | Admitting: *Deleted

## 2023-12-12 ENCOUNTER — Ambulatory Visit (HOSPITAL_COMMUNITY)

## 2023-12-12 DIAGNOSIS — D649 Anemia, unspecified: Secondary | ICD-10-CM

## 2023-12-12 DIAGNOSIS — D61818 Other pancytopenia: Secondary | ICD-10-CM

## 2023-12-12 DIAGNOSIS — D638 Anemia in other chronic diseases classified elsewhere: Secondary | ICD-10-CM

## 2023-12-12 DIAGNOSIS — N1832 Chronic kidney disease, stage 3b: Secondary | ICD-10-CM

## 2023-12-12 DIAGNOSIS — D631 Anemia in chronic kidney disease: Secondary | ICD-10-CM

## 2023-12-13 ENCOUNTER — Ambulatory Visit (INDEPENDENT_AMBULATORY_CARE_PROVIDER_SITE_OTHER): Payer: Self-pay | Admitting: Family Medicine

## 2023-12-13 ENCOUNTER — Encounter: Payer: Self-pay | Admitting: Family Medicine

## 2023-12-13 VITALS — BP 135/75 | HR 65 | Ht 67.0 in | Wt 136.0 lb

## 2023-12-13 DIAGNOSIS — I739 Peripheral vascular disease, unspecified: Secondary | ICD-10-CM | POA: Diagnosis not present

## 2023-12-13 DIAGNOSIS — M79604 Pain in right leg: Secondary | ICD-10-CM

## 2023-12-13 NOTE — Progress Notes (Signed)
 Established Patient Office Visit   Subjective  Patient ID: Melvin Taylor, male    DOB: 11/20/1965  Age: 58 y.o. MRN: [id number]  Chief Complaint  Patient presents with   Leg Pain    Rt. Leg pain has gotten  more severe since March stroke. Shooting pain from hip down to foot. Has had several falls since stroke.     He  has a past medical history of Bipolar 1 disorder (HCC), Bone spur, Cervical radiculopathy, Chronic back pain, Chronic chest wall pain, Chronic neck pain, Coronary artery disease, Diabetes mellitus, Hallucinations, Headache(784.0), History of gout, HTN (hypertension), Insomnia, Neuropathy, Pain management, Polysubstance abuse (HCC), Right leg pain, Schizophrenia (HCC), Seizures (HCC), Sleep apnea, Stroke (HCC), and Transfusion of blood product refused for religious reason.  Leg Pain: The patient reports right leg pain without a clear injury mechanism. The pain is described as aching and shooting, with a severity of 10/10, and has been worsening since onset. Associated symptoms include inability to bear weight, muscle weakness, and numbness and tingling. There is no report of any foreign body. The pain is aggravated by palpation, movement, and weight-bearing. The patient has tried rest and acetaminophen , but reports no relief from these measures.    Review of Systems  Constitutional:  Negative for chills and fever.  Respiratory:  Negative for shortness of breath.   Cardiovascular:  Negative for chest pain.  Neurological:  Positive for tingling. Negative for dizziness and headaches.      Objective:     BP 135/75   Pulse 65   Ht 5\' 7"  (1.702 m)   Wt 136 lb 0.6 oz (61.7 kg)   SpO2 96%   BMI 21.31 kg/m  BP Readings from Last 3 Encounters:  12/13/23 135/75  12/04/23 127/79  11/14/23 101/67      Physical Exam Vitals reviewed.  Constitutional:      General: He is not in acute distress.    Appearance: Normal appearance. He is not ill-appearing, toxic-appearing  or diaphoretic.  HENT:     Head: Normocephalic.  Eyes:     General:        Right eye: No discharge.        Left eye: No discharge.     Conjunctiva/sclera: Conjunctivae normal.  Cardiovascular:     Rate and Rhythm: Normal rate.     Pulses: Normal pulses.     Heart sounds: Normal heart sounds.  Pulmonary:     Effort: Pulmonary effort is normal. No respiratory distress.     Breath sounds: Normal breath sounds.  Musculoskeletal:        General: Tenderness present.     Right lower leg: Tenderness present.     Left lower leg: No tenderness.  Skin:    General: Skin is warm and dry.  Neurological:     Mental Status: He is alert.  Psychiatric:        Mood and Affect: Mood normal.        Behavior: Behavior normal.      No results found for any visits on 12/13/23.  The ASCVD Risk score (Arnett DK, et al., 2019) failed to calculate for the following reasons:   Risk score cannot be calculated because patient has a medical history suggesting prior/existing ASCVD    Assessment & Plan:  Right leg pain Assessment & Plan: ABI testing and venous ultrasound have been ordered to evaluate potential causes of right leg pain Pain is possibly nerve-related. Recommend follow-up with PCP to consider  starting Cymbalta  for long-term management of neuropathic pain  Orders: -     US  Venous Img Lower Unilateral Right (DVT); Future  Claudication (HCC) -     VAS US  ABI WITH/WO TBI; Future    Return if symptoms worsen or fail to improve.   Avelino Lek Amber Bail, FNP

## 2023-12-13 NOTE — Assessment & Plan Note (Signed)
 ABI testing and venous ultrasound have been ordered to evaluate potential causes of right leg pain Pain is possibly nerve-related. Recommend follow-up with PCP to consider starting Cymbalta  for long-term management of neuropathic pain

## 2023-12-13 NOTE — Patient Instructions (Addendum)
        Great to see you today.  I have refilled the medication(s) we provide.   If labs were collected, we will inform you of lab results once received either by echart message or telephone call.   - echart message- for normal results that have been seen by the patient already.   - telephone call: abnormal results or if patient has not viewed results in their echart.   - Please take medications as prescribed. - Follow up with your primary health provider if any health concerns arises. - If symptoms worsen please contact your primary care provider and/or visit the emergency department.    Possible options Lyrica or Cymbalta   for nerve pain

## 2023-12-14 ENCOUNTER — Inpatient Hospital Stay

## 2023-12-14 ENCOUNTER — Inpatient Hospital Stay: Attending: Hematology | Admitting: Hematology

## 2023-12-14 VITALS — BP 123/70 | HR 51 | Temp 98.0°F | Resp 16 | Wt 135.4 lb

## 2023-12-14 DIAGNOSIS — D649 Anemia, unspecified: Secondary | ICD-10-CM

## 2023-12-14 DIAGNOSIS — D638 Anemia in other chronic diseases classified elsewhere: Secondary | ICD-10-CM

## 2023-12-14 DIAGNOSIS — N1832 Chronic kidney disease, stage 3b: Secondary | ICD-10-CM | POA: Diagnosis present

## 2023-12-14 DIAGNOSIS — D631 Anemia in chronic kidney disease: Secondary | ICD-10-CM | POA: Insufficient documentation

## 2023-12-14 DIAGNOSIS — Z79899 Other long term (current) drug therapy: Secondary | ICD-10-CM | POA: Diagnosis not present

## 2023-12-14 DIAGNOSIS — D61818 Other pancytopenia: Secondary | ICD-10-CM

## 2023-12-14 LAB — VITAMIN B12: Vitamin B-12: 560 pg/mL (ref 180–914)

## 2023-12-14 LAB — IRON AND TIBC
Iron: 89 ug/dL (ref 45–182)
Saturation Ratios: 31 % (ref 17.9–39.5)
TIBC: 285 ug/dL (ref 250–450)
UIBC: 196 ug/dL

## 2023-12-14 LAB — CBC WITH DIFFERENTIAL/PLATELET
Abs Immature Granulocytes: 0 10*3/uL (ref 0.00–0.07)
Basophils Absolute: 0 10*3/uL (ref 0.0–0.1)
Basophils Relative: 0 %
Eosinophils Absolute: 0.1 10*3/uL (ref 0.0–0.5)
Eosinophils Relative: 2 %
HCT: 31.8 % — ABNORMAL LOW (ref 39.0–52.0)
Hemoglobin: 10.5 g/dL — ABNORMAL LOW (ref 13.0–17.0)
Immature Granulocytes: 0 %
Lymphocytes Relative: 67 %
Lymphs Abs: 3 10*3/uL (ref 0.7–4.0)
MCH: 30.5 pg (ref 26.0–34.0)
MCHC: 33 g/dL (ref 30.0–36.0)
MCV: 92.4 fL (ref 80.0–100.0)
Monocytes Absolute: 0.4 10*3/uL (ref 0.1–1.0)
Monocytes Relative: 9 %
Neutro Abs: 1 10*3/uL — ABNORMAL LOW (ref 1.7–7.7)
Neutrophils Relative %: 22 %
Platelets: 153 10*3/uL (ref 150–400)
RBC: 3.44 MIL/uL — ABNORMAL LOW (ref 4.22–5.81)
RDW: 14.9 % (ref 11.5–15.5)
WBC: 4.5 10*3/uL (ref 4.0–10.5)
nRBC: 0 % (ref 0.0–0.2)

## 2023-12-14 LAB — FERRITIN: Ferritin: 138 ng/mL (ref 24–336)

## 2023-12-14 LAB — FOLATE: Folate: 18.5 ng/mL (ref >5.9)

## 2023-12-14 MED ORDER — EPOETIN ALFA-EPBX 10000 UNIT/ML IJ SOLN
5000.0000 [IU] | Freq: Once | INTRAMUSCULAR | Status: DC
Start: 2023-12-14 — End: 2023-12-14

## 2023-12-14 MED ORDER — EPOETIN ALFA-EPBX 3000 UNIT/ML IJ SOLN
3000.0000 [IU] | Freq: Once | INTRAMUSCULAR | Status: AC
  Administered 2023-12-14: 3000 [IU] via SUBCUTANEOUS
  Filled 2023-12-14: qty 1

## 2023-12-14 MED ORDER — EPOETIN ALFA-EPBX 2000 UNIT/ML IJ SOLN
2000.0000 [IU] | Freq: Once | INTRAMUSCULAR | Status: AC
  Administered 2023-12-14: 2000 [IU] via SUBCUTANEOUS
  Filled 2023-12-14: qty 1

## 2023-12-14 NOTE — Patient Instructions (Signed)
 [facility] [place] - A DEPT OF [facility]  Discharge Instructions: Thank you for choosing [facility] to provide your oncology and hematology care.  If you have a lab appointment with the Cancer Center - please note that after April 8th, 2024, all labs will be drawn in the cancer center.  You do not have to check in or register with the main entrance as you have in the past but will complete your check-in in the cancer center.  Wear comfortable clothing and clothing appropriate for easy access to any Portacath or PICC line.   We strive to give you quality time with your provider. You may need to reschedule your appointment if you arrive late (15 or more minutes).  Arriving late affects you and other patients whose appointments are after yours.  Also, if you miss three or more appointments without notifying the office, you may be dismissed from the clinic at the provider's discretion.      For prescription refill requests, have your pharmacy contact our office and allow 72 hours for refills to be completed.    Today you received the following chemotherapy and/or immunotherapy agents Retacrit  injection.       To help prevent nausea and vomiting after your treatment, we encourage you to take your nausea medication as directed.  BELOW ARE SYMPTOMS THAT SHOULD BE REPORTED IMMEDIATELY: *FEVER GREATER THAN 100.4 F (38 C) OR HIGHER *CHILLS OR SWEATING *NAUSEA AND VOMITING THAT IS NOT CONTROLLED WITH YOUR NAUSEA MEDICATION *UNUSUAL SHORTNESS OF BREATH *UNUSUAL BRUISING OR BLEEDING *URINARY PROBLEMS (pain or burning when urinating, or frequent urination) *BOWEL PROBLEMS (unusual diarrhea, constipation, pain near the anus) TENDERNESS IN MOUTH AND THROAT WITH OR WITHOUT PRESENCE OF ULCERS (sore throat, sores in mouth, or a toothache) UNUSUAL RASH, SWELLING OR PAIN  UNUSUAL VAGINAL DISCHARGE OR ITCHING   Items with * indicate a potential emergency and should be  followed up as soon as possible or go to the Emergency Department if any problems should occur.  Please show the CHEMOTHERAPY ALERT CARD or IMMUNOTHERAPY ALERT CARD at check-in to the Emergency Department and triage nurse.  Should you have questions after your visit or need to cancel or reschedule your appointment, please contact [facility] [place] - A DEPT OF [facility] [phone]  and follow the prompts.  Office hours are 8:00 a.m. to 4:30 p.m. Monday - Friday. Please note that voicemails left after 4:00 p.m. may not be returned until the following business day.  We are closed weekends and major holidays. You have access to a nurse at all times for urgent questions. Please call the main number to the clinic [phone] and follow the prompts.  For any non-urgent questions, you may also contact your provider using MyChart. We now offer e-Visits for anyone 69 and older to request care online for non-urgent symptoms. For details visit [email or web address].   Also download the MyChart app! Go to the app store, search "MyChart", open the app, select [place], and log in with your MyChart username and password.

## 2023-12-14 NOTE — Progress Notes (Signed)
 Melvin Taylor presents today for injection per the provider's orders.  Retacrit  administration without incident; injection site WNL; see MAR for injection details.  Patient tolerated procedure well and without incident.  No questions or complaints noted at this time. Discharged from clinic by wheel chair in stable condition. Alert and oriented x 3. F/U with [facility] as scheduled.

## 2023-12-14 NOTE — Progress Notes (Signed)
 [facility] [address] [place] [address]   Clinic Day:  12/14/23   Referring physician: Tobi Fortes, Taylor  Patient Care Team: Melvin Taylor as PCP - General (Internal Medicine) Melvin Hughs, Taylor (Inactive) as PCP - Cardiology (Cardiology) Melvin Cheadle Windsor Hatcher, Taylor as Consulting Physician (Gastroenterology)   ASSESSMENT & PLAN:   Assessment:  1.  Normocytic anemia: - Patient seen at the request of Dr. 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 - Colonoscopy (08/02/2018): Diverticulosis in the sigmoid and descending colon.  Otherwise normal exam. - EGD (2016): Normal esophagus, s/p Maloney dilatation.  Abnormal gastric mucosa of uncertain significance, chronic active gastritis. - Epogen  started on 11/23/2021, currently receiving 5000 units every 2 weeks - Received 3 infusions of Venofer  300 mg, last one on 01/06/2023  2.  Social/family history: - Lives by himself.  He has about 10 hours of aide per day helps with day-to-day activities like cooking, taking shower as he had recurrent CVA.  He quit cigarette smoking more than 10 years ago. - Paternal great aunt had cancer.  Plan:  1.  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 - He is receiving Epogen  5000 units every 2 weeks as needed. - He was hospitalized twice for subdural hematoma from a fall and Brilinta  was discontinued.  He had bur holes placed with evacuation of the hematoma.  He is following up with [facility] - Labs today show hemoglobin 10.5 and rest of the CBC was normal.  Vitamin B12 was normal at 560.  Ferritin is 138, trending down.  Saturation is 31.  Folic acid  was normal. - He will receive Epogen  5000 units today and continue CBC monitoring every 2 weeks. - Will give Monoferric 1 g IV x 1. - RTC 12 weeks for follow-up with repeat CBC, ferritin and iron  panel.   Orders  Placed This Encounter  Procedures   CBC    Standing Status:   Future    Expected Date:   03/11/2024    Expiration Date:   12/13/2024   Iron  and TIBC ([facility] ONLY)    Standing Status:   Future    Expected Date:   03/11/2024    Expiration Date:   12/13/2024   Ferritin    Standing Status:   Future    Expected Date:   03/11/2024    Expiration Date:   12/13/2024      Melvin Taylor,acting as a scribe for Melvin Taylor.,have documented all relevant documentation on the behalf of Melvin Taylor,as directed by  Melvin Taylor while in the presence of Melvin Taylor.  I, Melvin Boros Taylor, have reviewed the above documentation for accuracy and completeness, and I agree with the above.    Melvin Taylor   5/15/202510:08 AM  CHIEF COMPLAINT/PURPOSE OF CONSULT:   Diagnosis: anemia  Current Therapy: Epogen  and intermittent IV iron   HISTORY OF PRESENT ILLNESS:   Melvin Taylor is a 58 y.o. male presenting to clinic today for evaluation of anemia at the request of Melvin Taylor.  His most recent Hemoglobin panel on 02/16/23 found decreased hbg at 9.1. Of note, he has stage 3b CKD.   Today, he states that he is doing well overall. His appetite level is at 100%. His energy level is at 80%.   INTERVAL HISTORY:   Melvin Taylor is  a 58 y.o. male presenting to the clinic today for follow-up of anemia. He was last seen by me on 03/02/23.  Since his last visit, he has been admitted to the hospital twice for subdural hematoma. His first hospital admission was from 09/23/23 to 09/29/23 where imaging showed 12mm right subdural hematoma, brain compression, and cerebral edema. Brilinta  was held and he was given nicardipine drip for hypertension. Size of hematoma improved, as did his disposition, and he was discharged.   Melvin Taylor was admitted to the hospital a second time from 10/23/23 to 10/31/23 for worsening of subdural hematoma  found on outpatient imaging. He then underwent right burr holes on 10/24/23 and healed well post-operatively.   His most recent CT head  from 11/21/23 showed: Prior right frontal and parietal burr holes for subdural evacuation with interval removal of extra-axial drain since prior CT of October 26, 2023. There is residual mixed attenuation subdural hemorrhage primarily subacute which appears increased in volume currently measuring up to 15 mm previously 9 mm. There is 7 mm leftward midline shift previously 5 mm.   Today, he states that he is doing well overall. His appetite level is at 100%. His energy level is at 75%.   PAST MEDICAL HISTORY:   Past Medical History: Past Medical History:  Diagnosis Date   Bipolar 1 disorder (HCC)    depression/anxiety   Bone spur    left heel   Cervical radiculopathy    Chronic back pain    Chronic chest wall pain    Chronic neck pain    Coronary artery disease    a. s/p CABG in 03/2016 with LIMA-LAD, SVG-D1, SVG-RCA, and Seq SVG-mid and distal OM   Diabetes mellitus    Type II   Hallucinations    "long history of them"   Headache(784.0)    History of gout    HTN (hypertension)    Insomnia    Neuropathy    Pain management    Polysubstance abuse (HCC)    Right leg pain    chronic   Schizophrenia (HCC)    Seizures (HCC)    last sz between July 5-9th, 2016; epilepsy   Sleep apnea    Stroke (HCC)    Transfusion of blood product refused for religious reason     Surgical History: Past Surgical History:  Procedure Laterality Date   ANTERIOR CERVICAL DECOMP/DISCECTOMY FUSION N/A 12/14/2015   Procedure: ANTERIOR CERVICAL DECOMPRESSION/DISCECTOMY FUSION CERVICAL FIVE -SIX;  Surgeon: Melvin Aldrich, Taylor;  Location: [facility];  Service: Neurosurgery;  Laterality: N/A;   BIOPSY  05/07/2015   Procedure: BIOPSY (Gastric);  Surgeon: Melvin Espy, Taylor;  Location: [facility] ORS;  Service: Endoscopy;;   BURR HOLE FOR SUBDURAL HEMATOMA Right 10/24/2023   CARDIAC  CATHETERIZATION N/A 03/07/2016   Procedure: Left Heart Cath and Coronary Angiography;  Surgeon: Melvin Binning, Taylor;  Location: [facility] INVASIVE CV LAB;  Service: Cardiovascular;  Laterality: N/A;   COLONOSCOPY WITH PROPOFOL  N/A 05/07/2015   [id number] diverticulosis, multiple colon polyps removed, tubular adenoma, serrated colon polyp. Next colonoscopy October 2019   COLONOSCOPY WITH PROPOFOL  N/A 08/02/2018   Procedure: COLONOSCOPY WITH PROPOFOL ;  Surgeon: Melvin Espy, Taylor;  Location: [facility] END[facility];  Service: Endoscopy;  Laterality: N/A;  12:00pm   CORONARY ARTERY BYPASS GRAFT N/A 03/28/2016   Procedure: CORONARY ARTERY BYPASS GRAFTING (CABG) x 5 USING GREATER SAPHENOUS VEIN;  Surgeon: Bartley Lightning, Taylor;  Location: [facility] OR;  Service: Open Heart Surgery;  Laterality: N/A;  ENDOVEIN HARVEST OF GREATER SAPHENOUS VEIN Right 03/28/2016   Procedure: ENDOVEIN HARVEST OF GREATER SAPHENOUS VEIN;  Surgeon: Bartley Lightning, Taylor;  Location: [facility] OR;  Service: Open Heart Surgery;  Laterality: Right;   ESOPHAGEAL DILATION N/A 05/07/2015   Procedure: ESOPHAGEAL DILATION WITH 56FR MALONEY DILATOR;  Surgeon: Melvin Espy, Taylor;  Location: [facility]S;  Service: Endoscopy;  Laterality: N/A;   ESOPHAGOGASTRODUODENOSCOPY (EGD) WITH PROPOFOL  N/A 05/07/2015   RMR: Status post dilation of normal esophagus. Gastritis.   IR CT HEAD LTD  12/17/2020   IR CT HEAD LTD  12/17/2020   IR INTRA CRAN STENT  12/17/2020   IR PERCUTANEOUS ART THROMBECTOMY/INFUSION INTRACRANIAL INC DIAG ANGIO  12/17/2020   IR RADIOLOGIST EVAL & MGMT  03/25/2021   KNEE SURGERY Left    arthroscopy   MANDIBLE FRACTURE SURGERY     POLYPECTOMY  05/07/2015   Procedure: POLYPECTOMY (Hepatic Flexure, Distal Transverse Colon, Rectal);  Surgeon: Melvin Espy, Taylor;  Location: [facility];  Service: Endoscopy;;   RADIOLOGY WITH ANESTHESIA N/A 12/16/2020   Procedure: IR WITH ANESTHESIA;  Surgeon: Luellen Sages, Taylor;  Location: [facility] OR;  Service: Radiology;   Laterality: N/A;   TEE WITHOUT CARDIOVERSION N/A 03/28/2016   Procedure: TRANSESOPHAGEAL ECHOCARDIOGRAM (TEE);  Surgeon: Bartley Lightning, Taylor;  Location: [facility] OR;  Service: Open Heart Surgery;  Laterality: N/A;   TONSILLECTOMY     TRIGGER FINGER RELEASE Left 10/15/2021   Procedure: RELEASE TRIGGER FINGER/A-1 PULLEY left middle or long finger;  Surgeon: Darrin Emerald, Taylor;  Location: [facility];  Service: Orthopedics;  Laterality: Left;    Social History: Social History   Socioeconomic History   Marital status: Single    Spouse name: Not on file   Number of children: Not on file   Years of education: 11th grade   Highest education level: Not on file  Occupational History   Occupation: unemployed    Employer: NOT EMPLOYED  Tobacco Use   Smoking status: Former    Current packs/day: 0.50    Average packs/day: 0.5 packs/day for 30.0 years (15.0 ttl pk-yrs)    Types: Cigarettes   Smokeless tobacco: Never   Tobacco comments:    3 cigs per day  Vaping Use   Vaping status: Never Used  Substance and Sexual Activity   Alcohol use: Not Currently   Drug use: Not Currently    Types: Marijuana   Sexual activity: Not Currently  Other Topics Concern   Not on file  Social History Narrative   09/13/21 Lives alone, caregiver daily, manages meds   Drinks a cup of coffee a day   Social Drivers of Health   Financial Resource Strain: Patient Declined (11/23/2023)   Received from [facility]   Overall Financial Resource Strain (CARDIA)    Difficulty of Paying Living Expenses: Patient declined  Food Insecurity: Patient Declined (11/23/2023)   [facility]   Hunger Vital Sign    Worried About Running Out of Food in the Last Year: Patient declined    Ran Out of Food in the Last Year: Patient declined  Transportation Needs: Patient Declined (11/23/2023)   Received from [facility]   PRAPARE - Transportation    Lack of Transportation (Medical): Patient declined    Lack of  Transportation (Non-Medical): Patient declined  Physical Activity: Not on file  Stress: Stress Concern Present (10/28/2023)   Received from [facility] - Occupational Stress Questionnaire    Feeling  of Stress : Very much  Social Connections: Patient Unable To Answer (08/23/2023)   Social Connection and Isolation Panel [NHANES]    Frequency of Communication with Friends and Family: Patient unable to answer    Frequency of Social Gatherings with Friends and Family: Patient unable to answer    Attends Religious Services: Patient unable to answer    Active Member of Clubs or Organizations: Patient unable to answer    Attends Banker Meetings: Patient unable to answer    Marital Status: Patient unable to answer  Intimate Partner Violence: Not At Risk (10/23/2023)   Received from [facility]   HITS    Over the last 12 months how often did your partner physically hurt you?: Never    Over the last 12 months how often did your partner insult you or talk down to you?: Never    Over the last 12 months how often did your partner threaten you with physical harm?: Never    Over the last 12 months how often did your partner scream or curse at you?: Never    Family History: Family History  Problem Relation Age of Onset   Diabetes Father    Hypertension Father    Sleep apnea Father    Stroke Sister    Arthritis Other    Diabetes Other    Asthma Other     Current Medications:  Current Outpatient Medications:    ACCU-CHEK GUIDE test strip, USE AS DIRECTED TO MONITOR BLOOD SUGAR (4) TIMES DAILY., Disp: 100 strip, Rfl: 6   acetaminophen  (TYLENOL ) 325 MG tablet, Take 2 tablets (650 mg total) by mouth every 6 (six) hours as needed for mild pain (pain score 1-3) (or Fever >/= 101)., Disp: , Rfl:    allopurinol  (ZYLOPRIM ) 100 MG tablet, TAKE ONE TABLET BY MOUTH EVERY DAY, Disp: 30 tablet, Rfl: 0   amLODipine  (NORVASC ) 10 MG tablet, Take 10 mg  by mouth daily., Disp: , Rfl:    atorvastatin  (LIPITOR ) 80 MG tablet, TAKE 1 TABLET BY MOUTH ONCE DAILY., Disp: 30 tablet, Rfl: 0   Cholecalciferol (VITAMIN D3) 50 MCG (2000 UT) capsule, Take 1 capsule (2,000 Units total) by mouth daily., Disp: 90 capsule, Rfl: 3   divalproex  (DEPAKOTE ) 250 MG DR tablet, Take 1 tablet (250 mg total) by mouth 2 (two) times daily., Disp: 60 tablet, Rfl: 11   escitalopram  (LEXAPRO ) 20 MG tablet, Take 1 tablet (20 mg total) by mouth daily., Disp: 30 tablet, Rfl: 0   ferrous sulfate  325 (65 FE) MG tablet, Take 1 tablet (325 mg total) by mouth daily with breakfast., Disp: 30 tablet, Rfl: 3   isosorbide  mononitrate (IMDUR ) 30 MG 24 hr tablet, TAKE ONE TABLET BY MOUTH EVERY DAY, Disp: 30 tablet, Rfl: 0   lamoTRIgine  (LAMICTAL ) 25 MG tablet, Take 2 tablets (50 mg total) by mouth 2 (two) times daily., Disp: 120 tablet, Rfl: 11   Multiple Vitamin (MULTIVITAMIN WITH MINERALS) TABS tablet, Take 1 tablet by mouth daily., Disp: , Rfl:    UNABLE TO FIND, 1 each by Does not apply route daily. Med Name: GLOVES 1B/M OR PRN, Disp: 1 each, Rfl: 0   UNABLE TO FIND, 1 each by Does not apply route daily. Med Name: PULL UPS 100/M OR PRN, Disp: 1 each, Rfl: 0   UNABLE TO FIND, 1 each by Does not apply route daily. Med Name: NUTRITIONAL SUPPLEMENT 180U/M OR PRN, Disp: 1 each, Rfl: 0   UNABLE TO FIND, 1 each by  Does not apply route daily. Med Name: UNDER PADS/CHUX 30/M OR PRN, Disp: 1 each, Rfl: 0   Vitamin D , Ergocalciferol , (DRISDOL ) 1.25 MG (50000 UNIT) CAPS capsule, Take 1 capsule (50,000 Units total) by mouth every 7 (seven) days., Disp: 5 capsule, Rfl: 0 No current facility-administered medications for this visit.  Facility-Administered Medications Ordered in Other Visits:    epoetin  alfa-epbx (RETACRIT ) injection 2,000 Units, 2,000 Units, Subcutaneous, Once, Melvin Taylor   epoetin  alfa-epbx (RETACRIT ) injection 3,000 Units, 3,000 Units, Subcutaneous, Once, Melvin Taylor   Allergies: Allergies  Allergen Reactions   Aspirin  Itching, Swelling and Other (See Comments)    Facial swelling    REVIEW OF SYSTEMS:   Review of Systems  Constitutional:  Negative for chills, fatigue and fever.  HENT:   Negative for lump/mass, mouth sores, nosebleeds, sore throat and trouble swallowing.   Eyes:  Negative for eye problems.  Respiratory:  Negative for cough and shortness of breath.   Cardiovascular:  Negative for chest pain, leg swelling and palpitations.  Gastrointestinal:  Negative for abdominal pain, constipation, diarrhea, nausea and vomiting.  Genitourinary:  Negative for bladder incontinence, difficulty urinating, dysuria, frequency, hematuria and nocturia.   Musculoskeletal:  Positive for arthralgias. Negative for back pain, flank pain, myalgias and neck pain.  Skin:  Negative for itching and rash.  Neurological:  Positive for headaches. Negative for dizziness and numbness.  Hematological:  Does not bruise/bleed easily.  Psychiatric/Behavioral:  Negative for depression, sleep disturbance and suicidal ideas. The patient is not nervous/anxious.   All other systems reviewed and are negative.    VITALS:   Blood pressure 123/70, pulse (!) 51, temperature 98 F (36.7 C), temperature source Oral, resp. rate 16, weight 135 lb 5.8 oz (61.4 kg), SpO2 99%.  Wt Readings from Last 3 Encounters:  12/14/23 135 lb 5.8 oz (61.4 kg)  12/13/23 136 lb 0.6 oz (61.7 kg)  11/13/23 122 lb (55.3 kg)    Body mass index is 21.2 kg/m.   PHYSICAL EXAM:   Physical Exam Vitals and nursing note reviewed. Exam conducted with a chaperone present.  Constitutional:      Appearance: Normal appearance.  Cardiovascular:     Rate and Rhythm: Normal rate and regular rhythm.     Pulses: Normal pulses.     Heart sounds: Normal heart sounds.  Pulmonary:     Effort: Pulmonary effort is normal.     Breath sounds: Normal breath sounds.  Abdominal:     Palpations: Abdomen  is soft. There is no hepatomegaly, splenomegaly or mass.     Tenderness: There is no abdominal tenderness.  Musculoskeletal:     Right lower leg: No edema.     Left lower leg: No edema.  Lymphadenopathy:     Cervical: No cervical adenopathy.     Right cervical: No superficial, deep or posterior cervical adenopathy.    Left cervical: No superficial, deep or posterior cervical adenopathy.     Upper Body:     Right upper body: No supraclavicular or axillary adenopathy.     Left upper body: No supraclavicular or axillary adenopathy.  Neurological:     General: No focal deficit present.     Mental Status: He is alert and oriented to person, place, and time.  Psychiatric:        Mood and Affect: Mood normal.        Behavior: Behavior normal.     LABS:      Latest Ref Rng & Units  12/14/2023    7:46 AM 11/14/2023    1:17 PM 10/18/2023   11:31 AM  CBC  WBC 4.0 - 10.5 K/uL 4.5  4.3  4.4   Hemoglobin 13.0 - 17.0 g/dL 96.0  45.4  09.8   Hematocrit 39.0 - 52.0 % 31.8  31.0  31.3   Platelets 150 - 400 K/uL 153  180  285       Latest Ref Rng & Units 11/14/2023    1:17 PM 10/18/2023   11:31 AM 08/26/2023    5:30 AM  CMP  Glucose 70 - 99 mg/dL 66  64  85   BUN 6 - 24 mg/dL 26  11  19    Creatinine 0.76 - 1.27 mg/dL 1.19  1.47  8.29   Sodium 134 - 144 mmol/L 140  140  135   Potassium 3.5 - 5.2 mmol/L 4.8  4.9  4.3   Chloride 96 - 106 mmol/L 105  104  106   CO2 20 - 29 mmol/L 25  24  21    Calcium  8.7 - 10.2 mg/dL 9.0  8.7  8.5   Total Protein 6.0 - 8.5 g/dL  5.9    Total Bilirubin 0.0 - 1.2 mg/dL  0.4    Alkaline Phos 44 - 121 IU/L  67    AST 0 - 40 IU/L  17    ALT 0 - 44 IU/L  14       No results found for: "CEA1", "CEA" / No results found for: "CEA1", "CEA" No results found for: "PSA1" No results found for: "CAN199" No results found for: "CAN125"  Lab Results  Component Value Date   TOTALPROTELP 6.0 03/02/2023   TOTALPROTELP 6.0 03/02/2023   ALBUMINELP 3.8 03/02/2023   A1GS  0.2 03/02/2023   A2GS 0.3 (L) 03/02/2023   BETS 0.7 03/02/2023   GAMS 0.9 03/02/2023   MSPIKE Not Observed 03/02/2023   SPEI Comment 03/02/2023   Lab Results  Component Value Date   TIBC 285 12/14/2023   TIBC 290 05/23/2023   TIBC 313 03/02/2023   FERRITIN 138 12/14/2023   FERRITIN 249 05/23/2023   FERRITIN 220 03/02/2023   IRONPCTSAT 31 12/14/2023   IRONPCTSAT 39 05/23/2023   IRONPCTSAT 48 (H) 03/02/2023   Lab Results  Component Value Date   LDH 200 (H) 03/02/2023     STUDIES:   No results found.

## 2023-12-14 NOTE — Patient Instructions (Signed)
 [facility] at [facility] Discharge Instructions   You were seen and examined today by Dr. Ellin Saba.  He reviewed the results of your lab work which are normal/stable.   We will proceed with your injection today.   Return as scheduled.      Thank you for choosing [facility] at [facility] to provide your oncology and hematology care.  To afford each patient quality time with our provider, please arrive at least 15 minutes before your scheduled appointment time.   If you have a lab appointment with the Cancer Center please come in thru the Main Entrance and check in at the main information desk.  You need to re-schedule your appointment should you arrive 10 or more minutes late.  We strive to give you quality time with our providers, and arriving late affects you and other patients whose appointments are after yours.  Also, if you no show three or more times for appointments you may be dismissed from the clinic at the providers discretion.     Again, thank you for choosing [facility].  Our hope is that these requests will decrease the amount of time that you wait before being seen by our physicians.       _____________________________________________________________  Should you have questions after your visit to [facility], please contact our office at [phone] and follow the prompts.  Our office hours are 8:00 a.m. and 4:30 p.m. Monday - Friday.  Please note that voicemails left after 4:00 p.m. may not be returned until the following business day.  We are closed weekends and major holidays.  You do have access to a nurse 24-7, just call the main number to the clinic [phone] and do not press any options, hold on the line and a nurse will answer the phone.    For prescription refill requests, have your pharmacy contact our office and allow 72 hours.    Due to Covid, you will need to wear a mask upon  entering the hospital. If you do not have a mask, a mask will be given to you at the Main Entrance upon arrival. For doctor visits, patients may have 1 support person age 24 or older with them. For treatment visits, patients can not have anyone with them due to social distancing guidelines and our immunocompromised population.

## 2023-12-18 ENCOUNTER — Telehealth: Payer: Self-pay

## 2023-12-18 NOTE — Telephone Encounter (Signed)
 Copied from CRM [id number]. Topic: Clinical - Home Health Verbal Orders >> Dec 18, 2023  9:26 AM Jenna Moan wrote: Caller/Agency: Adriana Hopping from [facility] Callback Number: [phone] Service Requested: Physical Therapy Frequency: 2x a week for 3 weeks Any new concerns about the patient? Yes main concern for his right knee but test has been ordered

## 2023-12-18 NOTE — Telephone Encounter (Signed)
 Spoke to sister, explained it is an US  and that someone will call her once they get everything processed

## 2023-12-18 NOTE — Telephone Encounter (Signed)
 Verbal orders provided.

## 2023-12-18 NOTE — Telephone Encounter (Signed)
 Copied from CRM [id number]. Topic: General - Other >> Dec 18, 2023  8:43 AM Albertha Alosa wrote: Reason for CRM: Patient sister Sherral Do, called in regarding patient needing a xray on his knee, is wanting someone to give her a callback on when and where she needs to go for this  [phone]

## 2023-12-18 NOTE — Progress Notes (Signed)
 Cardiology Office Note    Date:  01/01/2024   ID:  Melvin Taylor, DOB 1966-06-27, MRN [id number]  PCP:  Tobi Fortes, MD  Cardiologist: Drake Gens, MD (Inactive)  New to me      History of Present Illness:    Melvin Taylor is a 58 y.o. male  new to me previously seen by Dr Heddy Liverpool of CAD (s/p CABG in 03/2016 with LIMA-LAD, SVG-D1, SVG-RCA, and Seq SVG-mid and distal OM), HTN, HLD, IDDM, OSA, Bipolar Disorder, Schizophrenia and tobacco use   TTE 12/17/20 EF 65-70%   Present to [facility] ED on 09/14/2020 for evaluation of recurrent chest pain. Troponin values were negative and EKG was without acute changes. He was started on Imdur  30 mg daily and discharged home. His stress test was performed on 09/21/2020 and showed normal resting and stress perfusion with no evidence of ischemia and was overall a low risk study.  Thinks Ranexa  has caused some nausea Some palpitations brief skips with high caffeine intake both coffee and soda Activity limited by left knee pain   Admitted with stroke 12/21/20-12/30/20 right sided weakness and speech difficultyMRI with scattered infarcts in left MCA/ affecting the frontal, parietal and occipital lobe Had mechanical thrombectomy EEG negative for seizures previously on lamicatal and depakote  DM poorly controlled A1c 9.7 Had marijuana in drug screen ? Allergy to ASA and d/c with Brilinta  D/c to rehab    No aid in room with him today  She is with him 9-4 7 days / week. He has two sisters near by No angina Some residual slow speech and right arm weakness from stroke Use to work for a tree [employer]   No chest pain Like to watch the [employer] play football Difficulty walking with a cane   Denies angina compliant with meds    Past Medical History:  Diagnosis Date   Bipolar 1 disorder (HCC)    depression/anxiety   Bone spur    left heel   Cervical radiculopathy    Chronic back pain    Chronic chest wall pain    Chronic neck pain     Coronary artery disease    a. s/p CABG in 03/2016 with LIMA-LAD, SVG-D1, SVG-RCA, and Seq SVG-mid and distal OM   Diabetes mellitus    Type II   Hallucinations    "long history of them"   Headache(784.0)    History of gout    HTN (hypertension)    Insomnia    Neuropathy    Pain management    Polysubstance abuse (HCC)    Right leg pain    chronic   Schizophrenia (HCC)    Seizures (HCC)    last sz between July 5-9th, 2016; epilepsy   Sleep apnea    Stroke (HCC)    Transfusion of blood product refused for religious reason     Past Surgical History:  Procedure Laterality Date   ANTERIOR CERVICAL DECOMP/DISCECTOMY FUSION N/A 12/14/2015   Procedure: ANTERIOR CERVICAL DECOMPRESSION/DISCECTOMY FUSION CERVICAL FIVE -SIX;  Surgeon: Agustina Aldrich, MD;  Location: [facility] NEURO ORS;  Service: Neurosurgery;  Laterality: N/A;   BIOPSY  05/07/2015   Procedure: BIOPSY (Gastric);  Surgeon: Suzette Espy, MD;  Location: [facility] ORS;  Service: Endoscopy;;   BURR HOLE FOR SUBDURAL HEMATOMA Right 10/24/2023   CARDIAC CATHETERIZATION N/A 03/07/2016   Procedure: Left Heart Cath and Coronary Angiography;  Surgeon: Arty Binning, MD;  Location: [facility] INVASIVE CV LAB;  Service: Cardiovascular;  Laterality: N/A;   COLONOSCOPY WITH PROPOFOL  N/A 05/07/2015   BJY:NWGNFAOZHY diverticulosis, multiple colon polyps removed, tubular adenoma, serrated colon polyp. Next colonoscopy October 2019   COLONOSCOPY WITH PROPOFOL  N/A 08/02/2018   Procedure: COLONOSCOPY WITH PROPOFOL ;  Surgeon: Suzette Espy, MD;  Location: [facility] ENDO SUITE;  Service: Endoscopy;  Laterality: N/A;  12:00pm   CORONARY ARTERY BYPASS GRAFT N/A 03/28/2016   Procedure: CORONARY ARTERY BYPASS GRAFTING (CABG) x 5 USING GREATER SAPHENOUS VEIN;  Surgeon: Bartley Lightning, MD;  Location: [facility] OR;  Service: Open Heart Surgery;  Laterality: N/A;   ENDOVEIN HARVEST OF GREATER SAPHENOUS VEIN Right 03/28/2016   Procedure: ENDOVEIN HARVEST OF GREATER SAPHENOUS VEIN;  Surgeon:  Bartley Lightning, MD;  Location: [facility] OR;  Service: Open Heart Surgery;  Laterality: Right;   ESOPHAGEAL DILATION N/A 05/07/2015   Procedure: ESOPHAGEAL DILATION WITH 56FR MALONEY DILATOR;  Surgeon: Suzette Espy, MD;  Location: [facility] ORS;  Service: Endoscopy;  Laterality: N/A;   ESOPHAGOGASTRODUODENOSCOPY (EGD) WITH PROPOFOL  N/A 05/07/2015   RMR: Status post dilation of normal esophagus. Gastritis.   IR CT HEAD LTD  12/17/2020   IR CT HEAD LTD  12/17/2020   IR INTRA CRAN STENT  12/17/2020   IR PERCUTANEOUS ART THROMBECTOMY/INFUSION INTRACRANIAL INC DIAG ANGIO  12/17/2020   IR RADIOLOGIST EVAL & MGMT  03/25/2021   KNEE SURGERY Left    arthroscopy   MANDIBLE FRACTURE SURGERY     POLYPECTOMY  05/07/2015   Procedure: POLYPECTOMY (Hepatic Flexure, Distal Transverse Colon, Rectal);  Surgeon: Suzette Espy, MD;  Location: [facility]S;  Service: Endoscopy;;   RADIOLOGY WITH ANESTHESIA N/A 12/16/2020   Procedure: IR WITH ANESTHESIA;  Surgeon: Luellen Sages, MD;  Location: [facility] OR;  Service: Radiology;  Laterality: N/A;   TEE WITHOUT CARDIOVERSION N/A 03/28/2016   Procedure: TRANSESOPHAGEAL ECHOCARDIOGRAM (TEE);  Surgeon: Bartley Lightning, MD;  Location: [facility] OR;  Service: Open Heart Surgery;  Laterality: N/A;   TONSILLECTOMY     TRIGGER FINGER RELEASE Left 10/15/2021   Procedure: RELEASE TRIGGER FINGER/A-1 PULLEY left middle or long finger;  Surgeon: Darrin Emerald, MD;  Location: [facility]S;  Service: Orthopedics;  Laterality: Left;    Current Medications: Outpatient Medications Prior to Visit  Medication Sig Dispense Refill   ACCU-CHEK GUIDE test strip USE AS DIRECTED TO MONITOR BLOOD SUGAR (4) TIMES DAILY. 100 strip 6   acetaminophen  (TYLENOL ) 325 MG tablet Take 2 tablets (650 mg total) by mouth every 6 (six) hours as needed for mild pain (pain score 1-3) (or Fever >/= 101).     allopurinol  (ZYLOPRIM ) 100 MG tablet TAKE ONE TABLET BY MOUTH EVERY DAY 30 tablet 0   amLODipine  (NORVASC ) 10 MG tablet Take  10 mg by mouth daily.     atorvastatin  (LIPITOR ) 80 MG tablet TAKE 1 TABLET BY MOUTH ONCE DAILY. 30 tablet 0   Cholecalciferol (VITAMIN D3) 50 MCG (2000 UT) capsule Take 1 capsule (2,000 Units total) by mouth daily. 90 capsule 3   divalproex  (DEPAKOTE ) 250 MG DR tablet Take 1 tablet (250 mg total) by mouth 2 (two) times daily. 60 tablet 11   ferrous sulfate  325 (65 FE) MG tablet Take 1 tablet (325 mg total) by mouth daily with breakfast. 30 tablet 3   isosorbide  mononitrate (IMDUR ) 30 MG 24 hr tablet TAKE ONE TABLET BY MOUTH EVERY DAY 30 tablet 0   lamoTRIgine  (LAMICTAL ) 25 MG tablet Take 2 tablets (50 mg total) by mouth 2 (two) times daily. 120 tablet 11  Multiple Vitamin (MULTIVITAMIN WITH MINERALS) TABS tablet Take 1 tablet by mouth daily.     UNABLE TO FIND 1 each by Does not apply route daily. Med Name: GLOVES 1B/M OR PRN 1 each 0   UNABLE TO FIND 1 each by Does not apply route daily. Med Name: PULL UPS 100/M OR PRN 1 each 0   UNABLE TO FIND 1 each by Does not apply route daily. Med Name: NUTRITIONAL SUPPLEMENT 180U/M OR PRN 1 each 0   UNABLE TO FIND 1 each by Does not apply route daily. Med Name: UNDER PADS/CHUX 30/M OR PRN 1 each 0   Vitamin D , Ergocalciferol , (DRISDOL ) 1.25 MG (50000 UNIT) CAPS capsule Take 1 capsule (50,000 Units total) by mouth every 7 (seven) days. 5 capsule 0   escitalopram  (LEXAPRO ) 20 MG tablet Take 1 tablet (20 mg total) by mouth daily. (Patient not taking: Reported on 01/01/2024) 30 tablet 0   No facility-administered medications prior to visit.     Allergies:   Aspirin    Social History   Socioeconomic History   Marital status: Single    Spouse name: Not on file   Number of children: Not on file   Years of education: 11th grade   Highest education level: Not on file  Occupational History   Occupation: unemployed    Employer: NOT EMPLOYED  Tobacco Use   Smoking status: Former    Current packs/day: 0.50    Average packs/day: 0.5 packs/day for 30.0 years  (15.0 ttl pk-yrs)    Types: Cigarettes   Smokeless tobacco: Never   Tobacco comments:    3 cigs per day  Vaping Use   Vaping status: Never Used  Substance and Sexual Activity   Alcohol use: Not Currently   Drug use: Not Currently    Types: Marijuana   Sexual activity: Not Currently  Other Topics Concern   Not on file  Social History Narrative   09/13/21 Lives alone, caregiver daily, manages meds   Drinks a cup of coffee a day   Social Drivers of Health   Financial Resource Strain: Patient Declined (11/23/2023)   Received from [facility]   Overall Financial Resource Strain (CARDIA)    Difficulty of Paying Living Expenses: Patient declined  Food Insecurity: Patient Declined (11/23/2023)   Received from [facility]   Hunger Vital Sign    Worried About Running Out of Food in the Last Year: Patient declined    Ran Out of Food in the Last Year: Patient declined  Transportation Needs: Patient Declined (11/23/2023)   Received from [facility] - Transportation    Lack of Transportation (Medical): Patient declined    Lack of Transportation (Non-Medical): Patient declined  Physical Activity: Not on file  Stress: Stress Concern Present (10/28/2023)   Received from [facility] - Occupational Stress Questionnaire    Feeling of Stress : Very much  Social Connections: Patient Unable To Answer (08/23/2023)   Social Connection and Isolation Panel [NHANES]    Frequency of Communication with Friends and Family: Patient unable to answer    Frequency of Social Gatherings with Friends and Family: Patient unable to answer    Attends Religious Services: Patient unable to answer    Active Member of Clubs or Organizations: Patient unable to answer    Attends Banker Meetings: Patient unable to answer    Marital Status: Patient unable to answer     Family History:  The  patient's family history includes Arthritis in an other  family member; Asthma in an other family member; Diabetes in his father and another family member; Hypertension in his father; Stroke in his sister.   Review of Systems:   Please see the history of present illness.     General:  No chills, fever, night sweats or weight changes. Positive for left knee pain.  Cardiovascular:  No chest pain, dyspnea on exertion, edema, orthopnea, palpitations, paroxysmal nocturnal dyspnea. Dermatological: No rash, lesions/masses Respiratory: No cough, dyspnea Urologic: No hematuria, dysuria Abdominal:   No nausea, vomiting, diarrhea, bright red blood per rectum, melena, or hematemesis Neurologic:  No visual changes, wkns, changes in mental status.   All other systems reviewed and are otherwise negative except as noted above.   Physical Exam:    VS:  BP 120/64 (BP Location: Right Arm, Patient Position: Sitting, Cuff Size: Normal)   Pulse 61   Ht 5\' 7"  (1.702 m)   Wt 135 lb (61.2 kg)   SpO2 96%   BMI 21.14 kg/m    Affect appropriate Healthy:  appears stated age HEENT: normal Neck supple with no adenopathy JVP normal no bruits no thyromegaly Lungs clear with no wheezing and good diaphragmatic motion Heart:  S1/S2 no murmur, no rub, gallop or click PMI normal post CABG  Abdomen: benighn, BS positve, no tenderness, no AAA no bruit.  No HSM or HJR Distal pulses intact with no bruits No edema Neuro  Right sided weakness more LLE from stroke  Skin warm and dry No muscular weakness   Wt Readings from Last 3 Encounters:  01/01/24 135 lb (61.2 kg)  12/14/23 135 lb 5.8 oz (61.4 kg)  12/13/23 136 lb 0.6 oz (61.7 kg)     Studies/Labs Reviewed:   EKG:  EKG is not ordered today.    Recent Labs: 03/02/2023: TSH 2.703 10/18/2023: ALT 14 11/14/2023: BUN 26; Creatinine, Ser 1.69; Potassium 4.8; Sodium 140 12/28/2023: Hemoglobin 10.7; Platelets 140   Lipid Panel    Component Value Date/Time   CHOL 102 08/28/2022 0329   CHOL 94 (L) 08/05/2022 0904    TRIG 71 08/28/2022 0329   HDL 42 08/28/2022 0329   HDL 50 08/05/2022 0904   CHOLHDL 2.4 08/28/2022 0329   VLDL 14 08/28/2022 0329   LDLCALC 46 08/28/2022 0329   LDLCALC 29 08/05/2022 0904    Additional studies/ records that were reviewed today include:   Echocardiogram: 03/2019 IMPRESSIONS     1. The left ventricle has normal systolic function with an ejection  fraction of 60-65%. The cavity size was normal. Mild to moderate  concentric LVH. Left ventricular diastolic Doppler parameters are  indeterminate. No evidence of left ventricular  regional wall motion abnormalities.   2. Right atrial size was mildly dilated.   3. The mitral valve is grossly normal. Mild thickening of the mitral  valve leaflet.   4. The tricuspid valve is grossly normal.   5. The aortic valve is tricuspid.   6. The aorta is normal in size and structure.   NST: 09/2020 The study is normal. This is a low risk study. The left ventricular ejection fraction is normal (55-65%).   Normal resting and stress perfusion. No ischemia or infarction EF 56%     Assessment:    No diagnosis found.    Plan:   In order of problems listed above:  1. CAD - He is s/p CABG in 03/2016 with LIMA-LAD, SVG-D1, SVG-RCA, and Seq SVG-mid and distal OM.  Myovue 12/21/20 showed normal resting and stress perfusion with no evidence of ischemia and was overall a low risk study. - He denies any recurrent chest pain Ranexa  dose cut back . Will continue Plavix  75mg  daily (allergic to ASA), Amlodipine  10mg  daily, Atorvastatin  40mg  daily and Imdur  30mg  daily.   2. HTN - BP is well-controlled at 128/82 during today's visit. Continue Amlodipine , Lisinopril  and Imdur  at current dosing.   3. HLD - LDL was at 64 in 08/2020. Continue Atorvastatin  40mg  daily.   4. Palpitations - He reports occasional "skips" which sound most consistent with PAC's or PVC's. Cut back caffeine no need for monitor   5. Left Knee Pain - He reports  symptoms consistent with OA. He is unsure of any prior imaging and has never had a steroid injection to the site. F/U ortho   6. Stroke:  post mechanical thrombectomy on Briilinta  f/u primary consider more PT/OT s/p M1 Stenting Dr Alvira Josephs on [place] Residual right sided weakness   7. DM:  improved last A1c 09/24/23 5.3   8. Seizures:  EEG negative in hospital continue lamictal  and depakote    9. Anemia:  getting  Retacrit  injections Hb 10.4 12/14/23 Note he is a Jehovah's Witness   10. CRF:  Cr 1.69 range at baseline f/u nephrology    F/U in a year    Signed, Janelle Mediate, MD  01/01/2024 9:33 AM    [facility] [address] [place], [place] [address] Phone: [phone] Fax: [phone]

## 2023-12-21 ENCOUNTER — Inpatient Hospital Stay

## 2023-12-22 ENCOUNTER — Ambulatory Visit (HOSPITAL_COMMUNITY): Admitting: Occupational Therapy

## 2023-12-27 ENCOUNTER — Inpatient Hospital Stay

## 2023-12-27 ENCOUNTER — Other Ambulatory Visit: Payer: Self-pay

## 2023-12-27 VITALS — BP 114/73 | HR 56 | Temp 98.1°F | Resp 16

## 2023-12-27 DIAGNOSIS — D638 Anemia in other chronic diseases classified elsewhere: Secondary | ICD-10-CM

## 2023-12-27 DIAGNOSIS — N1832 Chronic kidney disease, stage 3b: Secondary | ICD-10-CM

## 2023-12-27 MED ORDER — SODIUM CHLORIDE 0.9 % IV SOLN: INTRAVENOUS | Status: DC

## 2023-12-27 MED ORDER — ACETAMINOPHEN 325 MG PO TABS
650.0000 mg | ORAL_TABLET | Freq: Once | ORAL | Status: AC
  Administered 2023-12-27: 650 mg via ORAL
  Filled 2023-12-27: qty 2

## 2023-12-27 MED ORDER — CETIRIZINE HCL 10 MG/ML IV SOLN
10.0000 mg | Freq: Once | INTRAVENOUS | Status: AC
  Administered 2023-12-27: 10 mg via INTRAVENOUS
  Filled 2023-12-27: qty 1

## 2023-12-27 MED ORDER — SODIUM CHLORIDE 0.9 % IV SOLN
1000.0000 mg | Freq: Once | INTRAVENOUS | Status: AC
  Administered 2023-12-27: 1000 mg via INTRAVENOUS
  Filled 2023-12-27: qty 1000

## 2023-12-27 NOTE — Progress Notes (Signed)
 Patient tolerated iron infusion with no complaints voiced.  Peripheral IV site clean and dry with good blood return noted before and after infusion.  Band aid applied.  VSS with discharge and left in satisfactory condition with no s/s of distress noted.

## 2023-12-27 NOTE — Patient Instructions (Signed)
 [facility] [place] - A DEPT OF [facility]  Discharge Instructions: Thank you for choosing [facility] to provide your oncology and hematology care.  If you have a lab appointment with the Cancer Center - please note that after April 8th, 2024, all labs will be drawn in the cancer center.  You do not have to check in or register with the main entrance as you have in the past but will complete your check-in in the cancer center.  Wear comfortable clothing and clothing appropriate for easy access to any Portacath or PICC line.   We strive to give you quality time with your provider. You may need to reschedule your appointment if you arrive late (15 or more minutes).  Arriving late affects you and other patients whose appointments are after yours.  Also, if you miss three or more appointments without notifying the office, you may be dismissed from the clinic at the provider's discretion.      For prescription refill requests, have your pharmacy contact our office and allow 72 hours for refills to be completed.    Today you received the following chemotherapy and/or immunotherapy agents Monoferric, return as scheduled.   To help prevent nausea and vomiting after your treatment, we encourage you to take your nausea medication as directed.  BELOW ARE SYMPTOMS THAT SHOULD BE REPORTED IMMEDIATELY: *FEVER GREATER THAN 100.4 F (38 C) OR HIGHER *CHILLS OR SWEATING *NAUSEA AND VOMITING THAT IS NOT CONTROLLED WITH YOUR NAUSEA MEDICATION *UNUSUAL SHORTNESS OF BREATH *UNUSUAL BRUISING OR BLEEDING *URINARY PROBLEMS (pain or burning when urinating, or frequent urination) *BOWEL PROBLEMS (unusual diarrhea, constipation, pain near the anus) TENDERNESS IN MOUTH AND THROAT WITH OR WITHOUT PRESENCE OF ULCERS (sore throat, sores in mouth, or a toothache) UNUSUAL RASH, SWELLING OR PAIN  UNUSUAL VAGINAL DISCHARGE OR ITCHING   Items with * indicate a potential emergency and  should be followed up as soon as possible or go to the Emergency Department if any problems should occur.  Please show the CHEMOTHERAPY ALERT CARD or IMMUNOTHERAPY ALERT CARD at check-in to the Emergency Department and triage nurse.  Should you have questions after your visit or need to cancel or reschedule your appointment, please contact [facility] [place] - A DEPT OF [facility] [phone]  and follow the prompts.  Office hours are 8:00 a.m. to 4:30 p.m. Monday - Friday. Please note that voicemails left after 4:00 p.m. may not be returned until the following business day.  We are closed weekends and major holidays. You have access to a nurse at all times for urgent questions. Please call the main number to the clinic [phone] and follow the prompts.  For any non-urgent questions, you may also contact your provider using MyChart. We now offer e-Visits for anyone 62 and older to request care online for non-urgent symptoms. For details visit [email or web address].   Also download the MyChart app! Go to the app store, search "MyChart", open the app, select [facility], and log in with your MyChart username and password.

## 2023-12-28 ENCOUNTER — Inpatient Hospital Stay

## 2023-12-28 VITALS — BP 103/71 | HR 62 | Temp 96.7°F | Resp 18

## 2023-12-28 DIAGNOSIS — N1832 Chronic kidney disease, stage 3b: Secondary | ICD-10-CM | POA: Diagnosis not present

## 2023-12-28 DIAGNOSIS — D638 Anemia in other chronic diseases classified elsewhere: Secondary | ICD-10-CM

## 2023-12-28 LAB — CBC WITH DIFFERENTIAL/PLATELET
Abs Immature Granulocytes: 0 10*3/uL (ref 0.00–0.07)
Basophils Absolute: 0 10*3/uL (ref 0.0–0.1)
Basophils Relative: 0 %
Eosinophils Absolute: 0 10*3/uL (ref 0.0–0.5)
Eosinophils Relative: 1 %
HCT: 32.3 % — ABNORMAL LOW (ref 39.0–52.0)
Hemoglobin: 10.7 g/dL — ABNORMAL LOW (ref 13.0–17.0)
Lymphocytes Relative: 66 %
Lymphs Abs: 2.3 10*3/uL (ref 0.7–4.0)
MCH: 31 pg (ref 26.0–34.0)
MCHC: 33.1 g/dL (ref 30.0–36.0)
MCV: 93.6 fL (ref 80.0–100.0)
Monocytes Absolute: 0.5 10*3/uL (ref 0.1–1.0)
Monocytes Relative: 13 %
Neutro Abs: 0.7 10*3/uL — ABNORMAL LOW (ref 1.7–7.7)
Neutrophils Relative %: 20 %
Platelets: 140 10*3/uL — ABNORMAL LOW (ref 150–400)
RBC: 3.45 MIL/uL — ABNORMAL LOW (ref 4.22–5.81)
RDW: 14.5 % (ref 11.5–15.5)
WBC: 3.5 10*3/uL — ABNORMAL LOW (ref 4.0–10.5)
nRBC: 0 % (ref 0.0–0.2)

## 2023-12-28 MED ORDER — EPOETIN ALFA-EPBX 10000 UNIT/ML IJ SOLN: 5000.0000 [IU] | Freq: Once | INTRAMUSCULAR | Status: DC

## 2023-12-28 MED ORDER — EPOETIN ALFA-EPBX 3000 UNIT/ML IJ SOLN
3000.0000 [IU] | Freq: Once | INTRAMUSCULAR | Status: AC
  Administered 2023-12-28: 3000 [IU] via SUBCUTANEOUS
  Filled 2023-12-28: qty 1

## 2023-12-28 MED ORDER — EPOETIN ALFA-EPBX 2000 UNIT/ML IJ SOLN
2000.0000 [IU] | Freq: Once | INTRAMUSCULAR | Status: AC
  Administered 2023-12-28: 2000 [IU] via SUBCUTANEOUS
  Filled 2023-12-28: qty 1

## 2023-12-28 NOTE — Patient Instructions (Signed)

## 2023-12-28 NOTE — Progress Notes (Signed)
 Patient tolerated injection with no complaints voiced.  Site clean and dry with no bruising or swelling noted at site.  See MAR for details.  Band aid applied.  Patient stable during and after injection.  Vss with discharge and left in satisfactory condition with no s/s of distress noted.

## 2023-12-29 LAB — PATHOLOGIST SMEAR REVIEW

## 2024-01-01 ENCOUNTER — Telehealth: Payer: Self-pay

## 2024-01-01 ENCOUNTER — Ambulatory Visit: Attending: Cardiovascular Disease | Admitting: Cardiovascular Disease

## 2024-01-01 VITALS — BP 120/64 | HR 61 | Ht 67.0 in | Wt 135.0 lb

## 2024-01-01 DIAGNOSIS — Z951 Presence of aortocoronary bypass graft: Secondary | ICD-10-CM

## 2024-01-01 DIAGNOSIS — I63512 Cerebral infarction due to unspecified occlusion or stenosis of left middle cerebral artery: Secondary | ICD-10-CM | POA: Diagnosis present

## 2024-01-01 DIAGNOSIS — I10 Essential (primary) hypertension: Secondary | ICD-10-CM | POA: Diagnosis not present

## 2024-01-01 DIAGNOSIS — E782 Mixed hyperlipidemia: Secondary | ICD-10-CM | POA: Diagnosis present

## 2024-01-01 NOTE — Patient Instructions (Signed)
 Medication Instructions:  Your physician recommends that you continue on your current medications as directed. Please refer to the Current Medication list given to you today.  *If you need a refill on your cardiac medications before your next appointment, please call your pharmacy*  Lab Work: NONE   If you have labs (blood work) drawn today and your tests are completely normal, you will receive your results only by: MyChart Message (if you have MyChart) OR A paper copy in the mail If you have any lab test that is abnormal or we need to change your treatment, we will call you to review the results.  Testing/Procedures: NONE   Follow-Up: At [facility], you and your health needs are our priority.  As part of our continuing mission to provide you with exceptional heart care, our providers are all part of one team.  This team includes your primary Cardiologist (physician) and Advanced Practice Providers or APPs (Physician Assistants and Nurse Practitioners) who all work together to provide you with the care you need, when you need it.  Your next appointment:   1 year(s)  Provider:   You may see Janelle Mediate, MD or one of the following Advanced Practice Providers on your designated Care Team:   Woodfin Hays, PA-C  [place], [place] Theotis Flake, [place]     We recommend signing up for the patient portal called "MyChart".  Sign up information is provided on this After Visit Summary.  MyChart is used to connect with patients for Virtual Visits (Telemedicine).  Patients are able to view lab/test results, encounter notes, upcoming appointments, etc.  Non-urgent messages can be sent to your provider as well.   To learn more about what you can do with MyChart, go to [email or web address].   Other Instructions Thank you for choosing [facility]!

## 2024-01-01 NOTE — Telephone Encounter (Signed)
 Copied from CRM [id number]. Topic: Referral - Status >> Jan 01, 2024  1:23 PM Ivette P wrote: Reason for CRM: Alecia called in to follow up on a referral based on the pt most recent visit with Illiana on 12/13/2023. Pt was told would have "ABI testing and venous ultrasound have been ordered to evaluate potential causes of right leg pain"  Referral was processed 12/13/23 but not referring office to send referral too.   Alecia is asking for an update and if someone can reach out her Gwynn Lesches ([phone]

## 2024-01-02 DIAGNOSIS — N1832 Chronic kidney disease, stage 3b: Secondary | ICD-10-CM | POA: Diagnosis not present

## 2024-01-02 DIAGNOSIS — I129 Hypertensive chronic kidney disease with stage 1 through stage 4 chronic kidney disease, or unspecified chronic kidney disease: Secondary | ICD-10-CM | POA: Diagnosis not present

## 2024-01-02 DIAGNOSIS — F32A Depression, unspecified: Secondary | ICD-10-CM

## 2024-01-02 DIAGNOSIS — F419 Anxiety disorder, unspecified: Secondary | ICD-10-CM

## 2024-01-02 DIAGNOSIS — Z9181 History of falling: Secondary | ICD-10-CM

## 2024-01-02 DIAGNOSIS — Z556 Problems related to health literacy: Secondary | ICD-10-CM

## 2024-01-02 DIAGNOSIS — S066X0D Traumatic subarachnoid hemorrhage without loss of consciousness, subsequent encounter: Secondary | ICD-10-CM

## 2024-01-02 DIAGNOSIS — M109 Gout, unspecified: Secondary | ICD-10-CM | POA: Diagnosis not present

## 2024-01-02 DIAGNOSIS — S065X0D Traumatic subdural hemorrhage without loss of consciousness, subsequent encounter: Secondary | ICD-10-CM

## 2024-01-02 DIAGNOSIS — E43 Unspecified severe protein-calorie malnutrition: Secondary | ICD-10-CM | POA: Diagnosis not present

## 2024-01-02 DIAGNOSIS — E785 Hyperlipidemia, unspecified: Secondary | ICD-10-CM

## 2024-01-02 DIAGNOSIS — G935 Compression of brain: Secondary | ICD-10-CM

## 2024-01-03 NOTE — Telephone Encounter (Signed)
 Patient has been scheduled for 6/11 and is aware of Appt.

## 2024-01-09 ENCOUNTER — Ambulatory Visit: Payer: Medicaid Other | Admitting: Diagnostic Neuroimaging

## 2024-01-10 ENCOUNTER — Ambulatory Visit: Attending: Internal Medicine

## 2024-01-10 ENCOUNTER — Other Ambulatory Visit: Payer: Self-pay

## 2024-01-10 DIAGNOSIS — D638 Anemia in other chronic diseases classified elsewhere: Secondary | ICD-10-CM

## 2024-01-10 DIAGNOSIS — I739 Peripheral vascular disease, unspecified: Secondary | ICD-10-CM | POA: Diagnosis not present

## 2024-01-11 ENCOUNTER — Telehealth: Payer: Self-pay

## 2024-01-11 ENCOUNTER — Inpatient Hospital Stay: Attending: Hematology

## 2024-01-11 ENCOUNTER — Encounter: Payer: Self-pay | Admitting: Internal Medicine

## 2024-01-11 ENCOUNTER — Ambulatory Visit: Admitting: Internal Medicine

## 2024-01-11 ENCOUNTER — Inpatient Hospital Stay

## 2024-01-11 VITALS — BP 113/70 | HR 77 | Ht 67.0 in

## 2024-01-11 DIAGNOSIS — E782 Mixed hyperlipidemia: Secondary | ICD-10-CM

## 2024-01-11 DIAGNOSIS — I739 Peripheral vascular disease, unspecified: Secondary | ICD-10-CM | POA: Diagnosis not present

## 2024-01-11 DIAGNOSIS — E1159 Type 2 diabetes mellitus with other circulatory complications: Secondary | ICD-10-CM

## 2024-01-11 DIAGNOSIS — D631 Anemia in chronic kidney disease: Secondary | ICD-10-CM | POA: Insufficient documentation

## 2024-01-11 DIAGNOSIS — D638 Anemia in other chronic diseases classified elsewhere: Secondary | ICD-10-CM

## 2024-01-11 DIAGNOSIS — I999 Unspecified disorder of circulatory system: Secondary | ICD-10-CM

## 2024-01-11 DIAGNOSIS — F32A Depression, unspecified: Secondary | ICD-10-CM

## 2024-01-11 DIAGNOSIS — N1832 Chronic kidney disease, stage 3b: Secondary | ICD-10-CM | POA: Insufficient documentation

## 2024-01-11 DIAGNOSIS — S065XAS Traumatic subdural hemorrhage with loss of consciousness status unknown, sequela: Secondary | ICD-10-CM

## 2024-01-11 DIAGNOSIS — I10 Essential (primary) hypertension: Secondary | ICD-10-CM | POA: Diagnosis not present

## 2024-01-11 DIAGNOSIS — S065XAA Traumatic subdural hemorrhage with loss of consciousness status unknown, initial encounter: Secondary | ICD-10-CM

## 2024-01-11 DIAGNOSIS — F419 Anxiety disorder, unspecified: Secondary | ICD-10-CM

## 2024-01-11 LAB — VAS US ABI WITH/WO TBI
Left ABI: 0.97
Right ABI: 0.66

## 2024-01-11 NOTE — Assessment & Plan Note (Signed)
 Renal function stable on labs from March.  He is followed by nephrology and was recently seen for follow-up.

## 2024-01-11 NOTE — Telephone Encounter (Signed)
-----   Message from Janelle Mediate sent at 01/11/2024  9:58 AM EDT ----- Has decreased circulation have him see Dr Alvenia Aus for consult

## 2024-01-11 NOTE — Progress Notes (Signed)
 Established Patient Office Visit  Subjective   Patient ID: Melvin Taylor, male    DOB: [birth year]  Age: 58 y.o. MRN: [id number]  Chief Complaint  Patient presents with   Medical Management of Chronic Issues    3 month f/u, caregiver reports pt has started his blood thinners yesterday.    Melvin Taylor returns to care today for routine follow-up.  He was last evaluated by me on 4/15 for hospital follow-up after admission for worsening subdural hematoma requiring bur holes 3/25.  Previously seen by me for routine follow-up on 2/3.  No medication changes were made at that time and 55-month follow-up was arranged.  In the interim, he has been evaluated by neurosurgery, orthopedic surgery, neurology, gastroenterology, oncology, and cardiology.  Today he continues to endorse chronic right leg pain.  He was evaluated by neurosurgery recently and, per patient and his caregiver, was cleared to resume Brilinta  yesterday.  Past Medical History:  Diagnosis Date   Bipolar 1 disorder (HCC)    depression/anxiety   Bone spur    left heel   Cervical radiculopathy    Chronic back pain    Chronic chest wall pain    Chronic neck pain    Coronary artery disease    a. s/p CABG in 03/2016 with LIMA-LAD, SVG-D1, SVG-RCA, and Seq SVG-mid and distal OM   Diabetes mellitus    Type II   Hallucinations    long history of them   Headache(784.0)    History of gout    HTN (hypertension)    Insomnia    Neuropathy    Pain management    Polysubstance abuse (HCC)    Right leg pain    chronic   Schizophrenia (HCC)    Seizures (HCC)    last sz between July 5-9th, 2016; epilepsy   Sleep apnea    Stroke (HCC)    Transfusion of blood product refused for religious reason    Past Surgical History:  Procedure Laterality Date   ANTERIOR CERVICAL DECOMP/DISCECTOMY FUSION N/A 12/14/2015   Procedure: ANTERIOR CERVICAL DECOMPRESSION/DISCECTOMY FUSION CERVICAL FIVE -SIX;  Surgeon: Agustina Aldrich, MD;  Location: [facility]  NEURO ORS;  Service: Neurosurgery;  Laterality: N/A;   BIOPSY  05/07/2015   Procedure: BIOPSY (Gastric);  Surgeon: Suzette Espy, MD;  Location: [facility] ORS;  Service: Endoscopy;;   BURR HOLE FOR SUBDURAL HEMATOMA Right 10/24/2023   CARDIAC CATHETERIZATION N/A 03/07/2016   Procedure: Left Heart Cath and Coronary Angiography;  Surgeon: Arty Binning, MD;  Location: [facility] INVASIVE CV LAB;  Service: Cardiovascular;  Laterality: N/A;   COLONOSCOPY WITH PROPOFOL  N/A 05/07/2015   JXB:[id number] diverticulosis, multiple colon polyps removed, tubular adenoma, serrated colon polyp. Next colonoscopy October 2019   COLONOSCOPY WITH PROPOFOL  N/A 08/02/2018   Procedure: COLONOSCOPY WITH PROPOFOL ;  Surgeon: Suzette Espy, MD;  Location: [facility];  Service: Endoscopy;  Laterality: N/A;  12:00pm   CORONARY ARTERY BYPASS GRAFT N/A 03/28/2016   Procedure: CORONARY ARTERY BYPASS GRAFTING (CABG) x 5 USING GREATER SAPHENOUS VEIN;  Surgeon: Bartley Lightning, MD;  Location: [facility] OR;  Service: Open Heart Surgery;  Laterality: N/A;   ENDOVEIN HARVEST OF GREATER SAPHENOUS VEIN Right 03/28/2016   Procedure: ENDOVEIN HARVEST OF GREATER SAPHENOUS VEIN;  Surgeon: Bartley Lightning, MD;  Location: [facility] OR;  Service: Open Heart Surgery;  Laterality: Right;   ESOPHAGEAL DILATION N/A 05/07/2015   Procedure: ESOPHAGEAL DILATION WITH 56FR MALONEY DILATOR;  Surgeon: Suzette Espy, MD;  Location: [facility]  ORS;  Service: Endoscopy;  Laterality: N/A;   ESOPHAGOGASTRODUODENOSCOPY (EGD) WITH PROPOFOL  N/A 05/07/2015   RMR: Status post dilation of normal esophagus. Gastritis.   IR CT HEAD LTD  12/17/2020   IR CT HEAD LTD  12/17/2020   IR INTRA CRAN STENT  12/17/2020   IR PERCUTANEOUS ART THROMBECTOMY/INFUSION INTRACRANIAL INC DIAG ANGIO  12/17/2020   IR RADIOLOGIST EVAL & MGMT  03/25/2021   KNEE SURGERY Left    arthroscopy   MANDIBLE FRACTURE SURGERY     POLYPECTOMY  05/07/2015   Procedure: POLYPECTOMY (Hepatic Flexure, Distal Transverse Colon,  Rectal);  Surgeon: Suzette Espy, MD;  Location: [facility];  Service: Endoscopy;;   RADIOLOGY WITH ANESTHESIA N/A 12/16/2020   Procedure: IR WITH ANESTHESIA;  Surgeon: Luellen Sages, MD;  Location: [facility] OR;  Service: Radiology;  Laterality: N/A;   TEE WITHOUT CARDIOVERSION N/A 03/28/2016   Procedure: TRANSESOPHAGEAL ECHOCARDIOGRAM (TEE);  Surgeon: Bartley Lightning, MD;  Location: [facility] OR;  Service: Open Heart Surgery;  Laterality: N/A;   TONSILLECTOMY     TRIGGER FINGER RELEASE Left 10/15/2021   Procedure: RELEASE TRIGGER FINGER/A-1 PULLEY left middle or long finger;  Surgeon: Darrin Emerald, MD;  Location: [facility] ORS;  Service: Orthopedics;  Laterality: Left;   Social History   Tobacco Use   Smoking status: Former    Current packs/day: 0.50    Average packs/day: 0.5 packs/day for 30.0 years (15.0 ttl pk-yrs)    Types: Cigarettes   Smokeless tobacco: Never   Tobacco comments:    3 cigs per day  Vaping Use   Vaping status: Never Used  Substance Use Topics   Alcohol use: Not Currently   Drug use: Not Currently    Types: Marijuana   Family History  Problem Relation Age of Onset   Diabetes Father    Hypertension Father    Sleep apnea Father    Stroke Sister    Arthritis Other    Diabetes Other    Asthma Other    Allergies  Allergen Reactions   Aspirin  Itching, Swelling and Other (See Comments)    Facial swelling   Review of Systems  Constitutional:  Negative for chills and fever.  HENT:  Negative for sore throat.   Respiratory:  Negative for cough and shortness of breath.   Cardiovascular:  Negative for chest pain, palpitations and leg swelling.  Gastrointestinal:  Negative for abdominal pain, blood in stool, constipation, diarrhea, nausea and vomiting.  Genitourinary:  Negative for dysuria and hematuria.  Musculoskeletal:  Positive for myalgias (Right leg pain).  Skin:  Negative for itching and rash.  Neurological:  Negative for dizziness and headaches.   Psychiatric/Behavioral:  Negative for depression and suicidal ideas.      Objective:     BP 113/70   Pulse 77   Ht 5' 7 (1.702 m)   SpO2 98%   BMI 21.14 kg/m  BP Readings from Last 3 Encounters:  01/11/24 113/70  01/01/24 120/64  12/28/23 103/71   Physical Exam Vitals reviewed.  Constitutional:      General: He is not in acute distress.    Appearance: Normal appearance. He is not ill-appearing.     Comments: Examined in wheelchair  HENT:     Head: Normocephalic and atraumatic.     Right Ear: External ear normal.     Left Ear: External ear normal.     Nose: Nose normal. No congestion or rhinorrhea.     Mouth/Throat:     Mouth: Mucous  membranes are moist.     Pharynx: Oropharynx is clear.   Eyes:     General: No scleral icterus.    Extraocular Movements: Extraocular movements intact.     Conjunctiva/sclera: Conjunctivae normal.     Pupils: Pupils are equal, round, and reactive to light.    Cardiovascular:     Rate and Rhythm: Normal rate and regular rhythm.     Pulses: Normal pulses.     Heart sounds: Normal heart sounds. No murmur heard. Pulmonary:     Effort: Pulmonary effort is normal.     Breath sounds: Normal breath sounds. No wheezing, rhonchi or rales.  Abdominal:     General: Abdomen is flat. Bowel sounds are normal. There is no distension.     Palpations: Abdomen is soft.     Tenderness: There is no abdominal tenderness.   Musculoskeletal:        General: No swelling or deformity. Normal range of motion.     Cervical back: Normal range of motion.     Right lower leg: No edema.     Left lower leg: No edema.   Skin:    General: Skin is warm and dry.     Capillary Refill: Capillary refill takes less than 2 seconds.     Comments: Well-healed surgical scar on the right side of skull   Neurological:     General: No focal deficit present.     Mental Status: He is alert and oriented to person, place, and time.     Cranial Nerves: No cranial nerve  deficit.     Motor: Weakness (3/5 RUE) present.     Deep Tendon Reflexes: Reflexes normal.     Comments: He endorses discomfort with light touch along the right shin  Psychiatric:        Mood and Affect: Mood normal.        Behavior: Behavior normal.        Thought Content: Thought content normal.   Last CBC Lab Results  Component Value Date   WBC 3.5 (L) 12/28/2023   HGB 10.7 (L) 12/28/2023   HCT 32.3 (L) 12/28/2023   MCV 93.6 12/28/2023   MCH 31.0 12/28/2023   RDW 14.5 12/28/2023   PLT 140 (L) 12/28/2023   Last metabolic panel Lab Results  Component Value Date   GLUCOSE 66 (L) 11/14/2023   NA 140 11/14/2023   K 4.8 11/14/2023   CL 105 11/14/2023   CO2 25 11/14/2023   BUN 26 (H) 11/14/2023   CREATININE 1.69 (H) 11/14/2023   EGFR 46 (L) 11/14/2023   CALCIUM  9.0 11/14/2023   PHOS 3.0 08/25/2023   PROT 5.9 (L) 10/18/2023   ALBUMIN  3.9 10/18/2023   LABGLOB 2.0 10/18/2023   AGRATIO 1.7 03/02/2023   BILITOT 0.4 10/18/2023   ALKPHOS 67 10/18/2023   AST 17 10/18/2023   ALT 14 10/18/2023   ANIONGAP 8 08/26/2023   Last lipids Lab Results  Component Value Date   CHOL 102 08/28/2022   HDL 42 08/28/2022   LDLCALC 46 08/28/2022   TRIG 71 08/28/2022   CHOLHDL 2.4 08/28/2022   Last hemoglobin A1c Lab Results  Component Value Date   HGBA1C 5.8 (H) 08/23/2023   Last thyroid  functions Lab Results  Component Value Date   TSH 2.703 03/02/2023   T4TOTAL 8.9 01/26/2022   Last vitamin D  Lab Results  Component Value Date   VD25OH 90.2 08/05/2022   Last vitamin B12 and Folate Lab Results  Component Value  Date   VITAMINB12 560 12/14/2023   FOLATE 18.5 12/14/2023     Assessment & Plan:   Problem List Items Addressed This Visit       Primary hypertension   Remains adequately controlled on current antihypertensive regimen.      Peripheral arterial disease (HCC) - Primary   He recently completed ABIs after presenting for an acute visit on 5/14 endorsing right  leg pain with claudication.  ABI 0.66 in the right lower extremity, consistent with moderate PAD.  He resumed Brilinta  yesterday (6/11).  Will place referral to vascular surgery for further evaluation and management.      Diabetes mellitus (HCC)   A1c 5.8 on labs from January.  Remains well-controlled with diet.      Subdural hematoma (HCC)   Evaluated by neurosurgery earlier this month for follow-up in the setting of recent SDH requiring bur holes 3/25.  Brilinta  has been on hold, however the patient and his caregiver report today that he was cleared to resume Brilinta  yesterday (6/11).      Stage 3b chronic kidney disease (CKD) (HCC)   Renal function stable on labs from March.  He is followed by nephrology and was recently seen for follow-up.      Hyperlipidemia   Currently prescribed atorvastatin  80 mg daily.  Lipid panel last updated in January 2024.  Repeat lipid panel ordered today.      Anxiety and depression   Stable with Lexapro  20 mg daily.      Anemia, chronic disease   Hgb stable.  Recently seen by hematology for follow-up.  He continues to receive Retacrit  and iron  infusions.      Return in about 3 months (around 04/12/2024).   Tobi Fortes, MD

## 2024-01-11 NOTE — Assessment & Plan Note (Signed)
Stable with Lexapro 20 mg daily.

## 2024-01-11 NOTE — Assessment & Plan Note (Signed)
 Hgb stable.  Recently seen by hematology for follow-up.  He continues to receive Retacrit  and iron  infusions.

## 2024-01-11 NOTE — Assessment & Plan Note (Signed)
 A1c 5.8 on labs from January.  Remains well-controlled with diet.

## 2024-01-11 NOTE — Assessment & Plan Note (Signed)
 Currently prescribed atorvastatin  80 mg daily.  Lipid panel last updated in January 2024.  Repeat lipid panel ordered today.

## 2024-01-11 NOTE — Patient Instructions (Signed)
 It was a pleasure to see you today.  Thank you for giving us  the opportunity to be involved in your care.  Below is a brief recap of your visit and next steps.  We will plan to see you again in 3 months.  Summary Vascular surgery referral placed today Repeat cholesterol panel ordered Follow up in 3 months

## 2024-01-11 NOTE — Telephone Encounter (Signed)
 Patient's sister (D[facility]) is aware of results and referral.

## 2024-01-11 NOTE — Telephone Encounter (Signed)
 Left message for patient to call back.  Placed referral for Dr. Alvenia Aus.

## 2024-01-11 NOTE — Assessment & Plan Note (Signed)
 Evaluated by neurosurgery earlier this month for follow-up in the setting of recent SDH requiring bur holes 3/25.  Brilinta  has been on hold, however the patient and his caregiver report today that he was cleared to resume Brilinta  yesterday (6/11).

## 2024-01-11 NOTE — Telephone Encounter (Signed)
 Pt is returning call to nurse.

## 2024-01-11 NOTE — Assessment & Plan Note (Signed)
 Remains adequately controlled on current antihypertensive regimen.

## 2024-01-11 NOTE — Assessment & Plan Note (Signed)
 He recently completed ABIs after presenting for an acute visit on 5/14 endorsing right leg pain with claudication.  ABI 0.66 in the right lower extremity, consistent with moderate PAD.  He resumed Brilinta  yesterday (6/11).  Will place referral to vascular surgery for further evaluation and management.

## 2024-01-12 ENCOUNTER — Ambulatory Visit: Payer: Self-pay | Admitting: Internal Medicine

## 2024-01-12 LAB — LIPID PANEL
Chol/HDL Ratio: 1.7 ratio (ref 0.0–5.0)
Cholesterol, Total: 111 mg/dL (ref 100–199)
HDL: 64 mg/dL (ref >39)
LDL Chol Calc (NIH): 31 mg/dL (ref 0–99)
Triglycerides: 80 mg/dL (ref 0–149)
VLDL Cholesterol Cal: 16 mg/dL (ref 5–40)

## 2024-01-15 ENCOUNTER — Emergency Department (HOSPITAL_COMMUNITY)
Admission: EM | Admit: 2024-01-15 | Discharge: 2024-01-15 | Disposition: A | Discharge status: Home / Self Care | Attending: Emergency Medicine | Admitting: Emergency Medicine

## 2024-01-15 ENCOUNTER — Emergency Department (HOSPITAL_COMMUNITY)

## 2024-01-15 DIAGNOSIS — Y92009 Unspecified place in unspecified non-institutional (private) residence as the place of occurrence of the external cause: Secondary | ICD-10-CM | POA: Diagnosis not present

## 2024-01-15 DIAGNOSIS — M25552 Pain in left hip: Secondary | ICD-10-CM | POA: Diagnosis not present

## 2024-01-15 DIAGNOSIS — M25551 Pain in right hip: Secondary | ICD-10-CM | POA: Diagnosis not present

## 2024-01-15 DIAGNOSIS — I2510 Atherosclerotic heart disease of native coronary artery without angina pectoris: Secondary | ICD-10-CM | POA: Diagnosis not present

## 2024-01-15 DIAGNOSIS — I10 Essential (primary) hypertension: Secondary | ICD-10-CM | POA: Diagnosis not present

## 2024-01-15 DIAGNOSIS — W06XXXA Fall from bed, initial encounter: Secondary | ICD-10-CM | POA: Diagnosis not present

## 2024-01-15 DIAGNOSIS — Z79899 Other long term (current) drug therapy: Secondary | ICD-10-CM | POA: Diagnosis not present

## 2024-01-15 DIAGNOSIS — Z8673 Personal history of transient ischemic attack (TIA), and cerebral infarction without residual deficits: Secondary | ICD-10-CM | POA: Insufficient documentation

## 2024-01-15 DIAGNOSIS — E119 Type 2 diabetes mellitus without complications: Secondary | ICD-10-CM | POA: Diagnosis not present

## 2024-01-15 DIAGNOSIS — S0990XA Unspecified injury of head, initial encounter: Secondary | ICD-10-CM | POA: Diagnosis present

## 2024-01-15 DIAGNOSIS — W19XXXA Unspecified fall, initial encounter: Secondary | ICD-10-CM

## 2024-01-15 NOTE — Discharge Instructions (Signed)
 Your CT scans and x-rays today are reassuring with no new injuries noted and your CT of your head shows that your previous blood clot is significantly reduced, almost gone since your procedure.  Plan follow-up care with your primary doctor for any further concerns.

## 2024-01-15 NOTE — ED Triage Notes (Signed)
 Pt. BIB [facility]. Pt. Feel out of bed this morning and hit his right side. Also hit he left eye. Pt. Has HX of fall and brain bleed in February 2025.

## 2024-01-15 NOTE — ED Provider Notes (Signed)
 [place] EMERGENCY DEPARTMENT AT [facility] Provider Note   CSN: [id number] Arrival date & time: 01/15/24  0900     Patient presents with: Melvin Taylor is a 58 y.o. 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  Patient fell out of bed this morning landing on his left side and hit his temple region on his bedside commode.  He endorses pain in his bilateral hip area, denies headache at this time but does have localized pain at the left temple.  There is a small abrasion of his left upper brow.  He denies LOC, dizziness or escalating headache since the fall.  He does have a significant recent history of subdural hematoma from the fall which occurred in February, had bur hole treatment, under the care of neurosurgery with [facility].  He has started back his Brilinta  last week.  Denies nausea or vomiting, dizziness, escalating headache.  He reports chronic neck and back pain, not worse today.  He has had no treatments prior to arrival.   The history is provided by the patient.       Prior to Admission medications   Medication Sig Start Date End Date Taking? Authorizing Provider  ACCU-CHEK GUIDE test strip USE AS DIRECTED TO MONITOR BLOOD SUGAR (4) TIMES DAILY. 04/24/23   Tobi Fortes, MD  acetaminophen  (TYLENOL ) 325 MG tablet Take 2 tablets (650 mg total) by mouth every 6 (six) hours as needed for mild pain (pain score 1-3) (or Fever >/= 101). 08/26/23   Colin Dawley, MD  allopurinol  (ZYLOPRIM ) 100 MG tablet TAKE ONE TABLET BY MOUTH EVERY DAY 10/03/23   Tobi Fortes, MD  amLODipine  (NORVASC ) 10 MG tablet Take 10 mg by mouth daily. 11/29/22   [provider]  atorvastatin  (LIPITOR ) 80 MG tablet TAKE 1 TABLET BY MOUTH ONCE DAILY. 04/07/23   Tobi Fortes, MD   Cholecalciferol (VITAMIN D3) 50 MCG (2000 UT) capsule Take 1 capsule (2,000 Units total) by mouth daily. 03/10/23   Tobi Fortes, MD  divalproex  (DEPAKOTE ) 250 MG DR tablet Take 1 tablet (250 mg total) by mouth 2 (two) times daily. 11/13/23   [name], NP  escitalopram  (LEXAPRO ) 20 MG tablet Take 1 tablet (20 mg total) by mouth daily. 09/14/22   Angiulli, Everlyn Hockey, PA-C  ferrous sulfate  325 (65 FE) MG tablet Take 1 tablet (325 mg total) by mouth daily with breakfast. 08/26/23   Colin Dawley, MD  isosorbide  mononitrate (IMDUR ) 30 MG 24 hr tablet TAKE ONE TABLET BY MOUTH EVERY DAY 10/03/23   Tobi Fortes, MD  lamoTRIgine  (LAMICTAL ) 25 MG tablet Take 2 tablets (50 mg total) by mouth 2 (two) times daily. 11/13/23   [name], NP  Multiple Vitamin (MULTIVITAMIN WITH MINERALS) TABS tablet Take 1 tablet by mouth daily. 09/14/22   Angiulli, Everlyn Hockey, PA-C  UNABLE TO FIND 1 each by Does not apply route daily. Med Name: GLOVES 1B/M OR PRN 06/20/23   Tobi Fortes, MD  UNABLE TO FIND 1 each by Does not apply route daily. Med Name: PULL UPS 100/M OR PRN 06/20/23   Tobi Fortes, MD  UNABLE TO FIND 1 each by Does not apply route daily. Med Name: NUTRITIONAL SUPPLEMENT 180U/M OR PRN 06/20/23   Tobi Fortes, MD  UNABLE TO FIND  1 each by Does not apply route daily. Med Name: UNDER PADS/CHUX 30/M OR PRN 06/20/23   Tobi Fortes, MD  Vitamin D , Ergocalciferol , (DRISDOL ) 1.25 MG (50000 UNIT) CAPS capsule Take 1 capsule (50,000 Units total) by mouth every 7 (seven) days. 09/14/22   Angiulli, Everlyn Hockey, PA-C    Allergies: Aspirin     Review of Systems  Constitutional:  Negative for fever.  HENT:  Negative for congestion and sore throat.   Eyes: Negative.   Respiratory:  Negative for chest tightness and shortness of breath.   Cardiovascular:  Negative for chest pain.  Gastrointestinal:  Negative for abdominal pain and nausea.  Genitourinary: Negative.   Musculoskeletal:  Positive for  arthralgias. Negative for joint swelling and neck pain.  Skin:  Positive for wound. Negative for rash.  Neurological:  Positive for headaches. Negative for dizziness, weakness, light-headedness and numbness.  Psychiatric/Behavioral: Negative.      Updated Vital Signs BP 115/76 (BP Location: Left Arm)   Pulse 77   Temp 99 F (37.2 C) (Oral)   Resp 18   Ht 5' 7 (1.702 m)   Wt 61.2 kg   SpO2 98%   BMI 21.14 kg/m   Physical Exam Vitals and nursing note reviewed.  Constitutional:      Appearance: He is well-developed.  HENT:     Head: Normocephalic and atraumatic.   Eyes:     Extraocular Movements: Extraocular movements intact.     Conjunctiva/sclera: Conjunctivae normal.     Pupils: Pupils are equal, round, and reactive to light.    Cardiovascular:     Rate and Rhythm: Normal rate and regular rhythm.     Heart sounds: Normal heart sounds.  Pulmonary:     Effort: Pulmonary effort is normal.     Breath sounds: Normal breath sounds. No wheezing.  Abdominal:     General: Bowel sounds are normal.     Palpations: Abdomen is soft.     Tenderness: There is no abdominal tenderness.   Musculoskeletal:        General: Normal range of motion.     Cervical back: Normal and normal range of motion. No deformity or bony tenderness.     Thoracic back: Normal. No bony tenderness.     Lumbar back: Normal. No bony tenderness.     Right hip: No deformity.     Left hip: Bony tenderness present. No deformity.     Comments: TTP left lateral hip/greater trochanter region.    Skin:    General: Skin is warm and dry.     Comments: Small abrasion/laceration left upper lid/brow,  hemostatic with wound edges well approximated.   Neurological:     Mental Status: He is alert.     (all labs ordered are listed, but only abnormal results are displayed) Labs Reviewed - No data to display  EKG: None  Radiology: CT Cervical Spine Wo Contrast Result Date: 01/15/2024 CLINICAL DATA:   58 year old male status post fall from bed. History of right side subdural hematoma status post surgical drainage earlier this year. EXAM: CT CERVICAL SPINE WITHOUT CONTRAST TECHNIQUE: Multidetector CT imaging of the cervical spine was performed without intravenous contrast. Multiplanar CT image reconstructions were also generated. RADIATION DOSE REDUCTION: This exam was performed according to the departmental dose-optimization program which includes automated exposure control, adjustment of the mA and/or kV according to patient size and/or use of iterative reconstruction technique. COMPARISON:  Head CT today.  Cervical spine CT 09/23/2023. FINDINGS: Alignment: Stable reversal  of cervical lordosis. Cervicothoracic junction alignment is within normal limits. Bilateral posterior element alignment is within normal limits. Skull base and vertebrae: Normal background bone mineralization. Visualized skull base is intact. No atlanto-occipital dissociation. C1 and C2 appear intact and aligned. Chronic C5-C6 ACDF, detailed below. No acute osseous abnormality identified. Soft tissues and spinal canal: No prevertebral fluid or swelling. No visible canal hematoma. Stable noncontrast neck soft tissues. Disc levels: Cervical spine degeneration, especially multilevel right side and left side C2-C3 facet arthropathy. Chronic C5-C6 ACDF hardware. No convincing hardware loosening. But no convincing interbody arthrodesis either. Developing bulky anterior C6-C7 osteophytosis again noted. Stable mild degenerative anterolisthesis of C4 on C5. Upper chest: Visible upper thoracic levels appear stable and intact. Negative lung apices. IMPRESSION: 1. No acute traumatic injury identified in the cervical spine. 2. 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 Electronically Signed   By: Marlise Simpers M.D.   On: 01/15/2024 10:46   DG HIP UNILAT WITH PELVIS 2-3 VIEWS LEFT Result Date: 01/15/2024 CLINICAL DATA:  58 year old male status post fall from  bed. History of right side subdural hematoma status post surgical drainage earlier this year. EXAM: DG HIP (WITH OR WITHOUT PELVIS) 2-3V LEFT; DG HIP (WITH OR WITHOUT PELVIS) 2-3V RIGHT COMPARISON:  CT Abdomen and Pelvis 07/17/2019. FINDINGS: AP view of the pelvis 1013 hours. Bone mineralization within normal limits. Femoral heads normally located. Pelvis appears intact. SI joints remain symmetric. Negative visible bowel gas pattern. Dedicated AP and frogleg lateral views of both hips. Bilateral proximal femurs appear intact. Bilateral calcified femoral artery atherosclerosis. Chronic right inguinal surgical clips. IMPRESSION: No acute fracture or dislocation identified about the pelvis or bilateral hips. Electronically Signed   By: Marlise Simpers M.D.   On: 01/15/2024 10:42   DG HIP UNILAT WITH PELVIS 2-3 VIEWS RIGHT Result Date: 01/15/2024 CLINICAL DATA:  58 year old male status post fall from bed. History of right side subdural hematoma status post surgical drainage earlier this year. EXAM: DG HIP (WITH OR WITHOUT PELVIS) 2-3V LEFT; DG HIP (WITH OR WITHOUT PELVIS) 2-3V RIGHT COMPARISON:  CT Abdomen and Pelvis 07/17/2019. FINDINGS: AP view of the pelvis 1013 hours. Bone mineralization within normal limits. Femoral heads normally located. Pelvis appears intact. SI joints remain symmetric. Negative visible bowel gas pattern. Dedicated AP and frogleg lateral views of both hips. Bilateral proximal femurs appear intact. Bilateral calcified femoral artery atherosclerosis. Chronic right inguinal surgical clips. IMPRESSION: No acute fracture or dislocation identified about the pelvis or bilateral hips. Electronically Signed   By: Marlise Simpers M.D.   On: 01/15/2024 10:42   CT Head Wo Contrast Result Date: 01/15/2024 CLINICAL DATA:  58 year old male status post fall from bed. History of right side subdural hematoma status post surgical drainage earlier this year. EXAM: CT HEAD WITHOUT CONTRAST TECHNIQUE: Contiguous axial images  were obtained from the base of the skull through the vertex without intravenous contrast. RADIATION DOSE REDUCTION: This exam was performed according to the departmental dose-optimization program which includes automated exposure control, adjustment of the mA and/or kV according to patient size and/or use of iterative reconstruction technique. COMPARISON:  [facility] head CT 09/23/2023. FINDINGS: Brain: Previously seen mixed density up to 12 mm right side subdural hematoma has regressed postoperatively. Low to intermediate density residual is 4-5 mm maximal thickness now. And no convincing hyperdense blood products. Midline shift has resolved. No acute ventriculomegaly, ex vacuo ventricular enlargement on the left with stable chronic multifocal cerebral hemisphere encephalomalacia. No new intracranial hemorrhage. Subarachnoid blood seen  in February is resolved. Normal basilar cisterns. Stable gray-white matter differentiation throughout the brain. Vascular: Chronic left MCA stent. Calcified atherosclerosis at the skull base. No suspicious intracranial vascular hyperdensity. Skull: Right superior and anterior convexity burr holes are new since February. No acute osseous abnormality identified. Sinuses/Orbits: Visualized paranasal sinuses and mastoids are stable and well aerated. Other: New postoperative changes along the superior right vertex since February. No definite acute orbit or scalp soft tissue injury. IMPRESSION: 1. Right Side subdural hematoma is substantially regressed since February with interval surgical drainage. Residual 4-5 mm low to intermediate density SDH. No midline shift. 2. No superimposed acute intracranial abnormality or acute traumatic injury identified. 3. Chronic left greater than right hemisphere infarcts with encephalomalacia. Chronic left MCA stent. Electronically Signed   By: Marlise Simpers M.D.   On: 01/15/2024 10:39     Procedures   Medications Ordered in the ED - No data  to display                                  Medical Decision Making Pt presenting with head and neck ,  left hip pain after rolling out of bed this am.  Recent subdural with burr hole evacuation from injury sustained 2/25.  Started back on his brilinta  last week,  concerning for reoccurence of subdural,  also concern for fractures/dislocation.  No concerns on exam - small abrasion left eyebrow - not requiring sutures or other closure.    Amount and/or Complexity of Data Reviewed Radiology: ordered.    Details: Reviewed and reassuring including CT head and neck, bilateral hips, all negative for acute injuries, reassuringly much smaller right subdural status post surgical intervention.  Risk Decision regarding hospitalization. Risk Details: No indication for hospitalization.        Final diagnoses:  Fall in home, initial encounter  Minor head injury, initial encounter    ED Discharge Orders     None          Alyse July 01/15/24 1122    Rosealee Concha, MD 01/15/24 1529

## 2024-01-17 ENCOUNTER — Other Ambulatory Visit: Payer: Self-pay | Admitting: Internal Medicine

## 2024-01-24 ENCOUNTER — Ambulatory Visit: Payer: Self-pay | Admitting: Family Medicine

## 2024-01-25 ENCOUNTER — Inpatient Hospital Stay

## 2024-01-25 VITALS — BP 108/79 | HR 64 | Temp 97.3°F | Resp 18

## 2024-01-25 DIAGNOSIS — N1832 Chronic kidney disease, stage 3b: Secondary | ICD-10-CM | POA: Diagnosis present

## 2024-01-25 DIAGNOSIS — D631 Anemia in chronic kidney disease: Secondary | ICD-10-CM | POA: Diagnosis present

## 2024-01-25 DIAGNOSIS — D638 Anemia in other chronic diseases classified elsewhere: Secondary | ICD-10-CM

## 2024-01-25 LAB — CBC
HCT: 31.6 % — ABNORMAL LOW (ref 39.0–52.0)
Hemoglobin: 10.3 g/dL — ABNORMAL LOW (ref 13.0–17.0)
MCH: 30.1 pg (ref 26.0–34.0)
MCHC: 32.6 g/dL (ref 30.0–36.0)
MCV: 92.4 fL (ref 80.0–100.0)
Platelets: 129 10*3/uL — ABNORMAL LOW (ref 150–400)
RBC: 3.42 MIL/uL — ABNORMAL LOW (ref 4.22–5.81)
RDW: 14.3 % (ref 11.5–15.5)
WBC: 3.9 10*3/uL — ABNORMAL LOW (ref 4.0–10.5)
nRBC: 0 % (ref 0.0–0.2)

## 2024-01-25 MED ORDER — EPOETIN ALFA-EPBX 2000 UNIT/ML IJ SOLN
2000.0000 [IU] | Freq: Once | INTRAMUSCULAR | Status: AC
  Administered 2024-01-25: 2000 [IU] via SUBCUTANEOUS
  Filled 2024-01-25: qty 1

## 2024-01-25 MED ORDER — EPOETIN ALFA-EPBX 10000 UNIT/ML IJ SOLN: 5000.0000 [IU] | Freq: Once | INTRAMUSCULAR | Status: DC

## 2024-01-25 MED ORDER — EPOETIN ALFA-EPBX 3000 UNIT/ML IJ SOLN
3000.0000 [IU] | Freq: Once | INTRAMUSCULAR | Status: AC
  Administered 2024-01-25: 3000 [IU] via SUBCUTANEOUS
  Filled 2024-01-25: qty 1

## 2024-01-25 NOTE — Patient Instructions (Signed)
 [facility] [place] - A DEPT OF [facility]  Discharge Instructions: Thank you for choosing [facility] to provide your oncology and hematology care.  If you have a lab appointment with the Cancer Center - please note that after April 8th, 2024, all labs will be drawn in the cancer center.  You do not have to check in or register with the main entrance as you have in the past but will complete your check-in in the cancer center.  Wear comfortable clothing and clothing appropriate for easy access to any Portacath or PICC line.   We strive to give you quality time with your provider. You may need to reschedule your appointment if you arrive late (15 or more minutes).  Arriving late affects you and other patients whose appointments are after yours.  Also, if you miss three or more appointments without notifying the office, you may be dismissed from the clinic at the provider's discretion.      For prescription refill requests, have your pharmacy contact our office and allow 72 hours for refills to be completed.    Today you received the following chemotherapy and/or immunotherapy agents Retacrit      To help prevent nausea and vomiting after your treatment, we encourage you to take your nausea medication as directed.  BELOW ARE SYMPTOMS THAT SHOULD BE REPORTED IMMEDIATELY: *FEVER GREATER THAN 100.4 F (38 C) OR HIGHER *CHILLS OR SWEATING *NAUSEA AND VOMITING THAT IS NOT CONTROLLED WITH YOUR NAUSEA MEDICATION *UNUSUAL SHORTNESS OF BREATH *UNUSUAL BRUISING OR BLEEDING *URINARY PROBLEMS (pain or burning when urinating, or frequent urination) *BOWEL PROBLEMS (unusual diarrhea, constipation, pain near the anus) TENDERNESS IN MOUTH AND THROAT WITH OR WITHOUT PRESENCE OF ULCERS (sore throat, sores in mouth, or a toothache) UNUSUAL RASH, SWELLING OR PAIN  UNUSUAL VAGINAL DISCHARGE OR ITCHING   Items with * indicate a potential emergency and should be followed up  as soon as possible or go to the Emergency Department if any problems should occur.  Please show the CHEMOTHERAPY ALERT CARD or IMMUNOTHERAPY ALERT CARD at check-in to the Emergency Department and triage nurse.  Should you have questions after your visit or need to cancel or reschedule your appointment, please contact [facility] [place] - A DEPT OF [facility] [phone]  and follow the prompts.  Office hours are 8:00 a.m. to 4:30 p.m. Monday - Friday. Please note that voicemails left after 4:00 p.m. may not be returned until the following business day.  We are closed weekends and major holidays. You have access to a nurse at all times for urgent questions. Please call the main number to the clinic [phone] and follow the prompts.  For any non-urgent questions, you may also contact your provider using MyChart. We now offer e-Visits for anyone 90 and older to request care online for non-urgent symptoms. For details visit [email or web address].   Also download the MyChart app! Go to the app store, search "MyChart", open the app, select [place], and log in with your MyChart username and password.

## 2024-01-25 NOTE — Progress Notes (Signed)
 Melvin Taylor presents today for Retacrit  injection per the provider's orders.  Hgb noted to be 10.3.  Stable during administration without incident; injection site WNL; see MAR for injection details.  Patient tolerated procedure well and without incident.  No questions or complaints noted at this time.

## 2024-01-26 ENCOUNTER — Encounter (HOSPITAL_COMMUNITY): Payer: Self-pay | Admitting: Interventional Radiology

## 2024-01-30 ENCOUNTER — Ambulatory Visit: Attending: Cardiovascular Disease | Admitting: Cardiovascular Disease

## 2024-01-30 ENCOUNTER — Encounter: Payer: Self-pay | Admitting: Cardiovascular Disease

## 2024-01-30 ENCOUNTER — Telehealth: Payer: Self-pay | Admitting: Cardiovascular Disease

## 2024-01-30 VITALS — BP 127/82 | HR 63 | Ht 67.0 in | Wt 135.0 lb

## 2024-01-30 DIAGNOSIS — I2510 Atherosclerotic heart disease of native coronary artery without angina pectoris: Secondary | ICD-10-CM | POA: Diagnosis present

## 2024-01-30 DIAGNOSIS — I739 Peripheral vascular disease, unspecified: Secondary | ICD-10-CM | POA: Insufficient documentation

## 2024-01-30 DIAGNOSIS — I10 Essential (primary) hypertension: Secondary | ICD-10-CM | POA: Insufficient documentation

## 2024-01-30 DIAGNOSIS — E785 Hyperlipidemia, unspecified: Secondary | ICD-10-CM | POA: Insufficient documentation

## 2024-01-30 DIAGNOSIS — Z8673 Personal history of transient ischemic attack (TIA), and cerebral infarction without residual deficits: Secondary | ICD-10-CM | POA: Insufficient documentation

## 2024-01-30 NOTE — Progress Notes (Addendum)
 Cardiology Office Note   Date:  01/30/2024   ID:  Melvin Taylor, DOB 1966-04-06, MRN [id number]  PCP:  Bevely Doffing, FNP  Cardiologist:  Dr. Delford  No chief complaint on file.     History of Present Illness: Melvin Taylor is a 58 y.o. male who was referred by Dr. Nishan for evaluation management of peripheral arterial disease.  He has known history of coronary artery disease status post CABG in 2017, essential hypertension, hyperlipidemia, diabetes mellitus, previous stroke, obstructive sleep apnea, bipolar disorder and tobacco use. He had a fall last year that resulted in subdural hematoma which required surgery.  Most recent CT head showed no significant progression of hematoma.  He was cleared to resume Brilinta  90 mg twice daily.  The patient's mobility has been limited since he had his stroke and he uses a wheelchair occasionally.  Most of the time, he uses a walker but he has been limited recently over the last few months by severe bilateral leg pain worse on the right side.  He is only able to walk short distance.  He also has significant discomfort in the right foot especially at night when he is trying to sleep.  Both feet are cold to touch.  No lower extremity ulceration.  He underwent lower extremity arterial Doppler last month which showed an ABI of 0.66 on the right and 0.97 on the left.  He is accompanied today by the caregiver.    Past Medical History:  Diagnosis Date   Bipolar 1 disorder (HCC)    depression/anxiety   Bone spur    left heel   Cervical radiculopathy    Chronic back pain    Chronic chest wall pain    Chronic neck pain    Coronary artery disease    a. s/p CABG in 03/2016 with LIMA-LAD, SVG-D1, SVG-RCA, and Seq SVG-mid and distal OM   Diabetes mellitus    Type II   Hallucinations    long history of them   Headache(784.0)    History of gout    HTN (hypertension)    Insomnia    Neuropathy    Pain management    Polysubstance abuse  (HCC)    Right leg pain    chronic   Schizophrenia (HCC)    Seizures (HCC)    last sz between July 5-9th, 2016; epilepsy   Sleep apnea    Stroke (HCC)    Transfusion of blood product refused for religious reason     Past Surgical History:  Procedure Laterality Date   ANTERIOR CERVICAL DECOMP/DISCECTOMY FUSION N/A 12/14/2015   Procedure: ANTERIOR CERVICAL DECOMPRESSION/DISCECTOMY FUSION CERVICAL FIVE -SIX;  Surgeon: Victory Gunnels, MD;  Location: [facility];  Service: Neurosurgery;  Laterality: N/A;   BIOPSY  05/07/2015   Procedure: BIOPSY (Gastric);  Surgeon: Lamar CHRISTELLA Hollingshead, MD;  Location: [facility] ORS;  Service: Endoscopy;;   BURR HOLE FOR SUBDURAL HEMATOMA Right 10/24/2023   CARDIAC CATHETERIZATION N/A 03/07/2016   Procedure: Left Heart Cath and Coronary Angiography;  Surgeon: Victory LELON Sharps, MD;  Location: [facility] INVASIVE CV LAB;  Service: Cardiovascular;  Laterality: N/A;   COLONOSCOPY WITH PROPOFOL  N/A 05/07/2015   MFM:ejwrnonwpr diverticulosis, multiple colon polyps removed, tubular adenoma, serrated colon polyp. Next colonoscopy October 2019   COLONOSCOPY WITH PROPOFOL  N/A 08/02/2018   Procedure: COLONOSCOPY WITH PROPOFOL ;  Surgeon: Hollingshead Lamar CHRISTELLA, MD;  Location: [facility];  Service: Endoscopy;  Laterality: N/A;  12:00pm   CORONARY ARTERY BYPASS GRAFT  N/A 03/28/2016   Procedure: CORONARY ARTERY BYPASS GRAFTING (CABG) x 5 USING GREATER SAPHENOUS VEIN;  Surgeon: Dorise MARLA Fellers, MD;  Location: [facility] OR;  Service: Open Heart Surgery;  Laterality: N/A;   ENDOVEIN HARVEST OF GREATER SAPHENOUS VEIN Right 03/28/2016   Procedure: ENDOVEIN HARVEST OF GREATER SAPHENOUS VEIN;  Surgeon: Dorise MARLA Fellers, MD;  Location: [facility] OR;  Service: Open Heart Surgery;  Laterality: Right;   ESOPHAGEAL DILATION N/A 05/07/2015   Procedure: ESOPHAGEAL DILATION WITH 56FR MALONEY DILATOR;  Surgeon: Lamar CHRISTELLA Hollingshead, MD;  Location: [facility] ORS;  Service: Endoscopy;  Laterality: N/A;   ESOPHAGOGASTRODUODENOSCOPY (EGD) WITH PROPOFOL   N/A 05/07/2015   RMR: Status post dilation of normal esophagus. Gastritis.   IR CT HEAD LTD  12/17/2020   IR CT HEAD LTD  12/17/2020   IR INTRA CRAN STENT  12/17/2020   IR PERCUTANEOUS ART THROMBECTOMY/INFUSION INTRACRANIAL INC DIAG ANGIO  12/17/2020   IR RADIOLOGIST EVAL & MGMT  03/25/2021   KNEE SURGERY Left    arthroscopy   MANDIBLE FRACTURE SURGERY     POLYPECTOMY  05/07/2015   Procedure: POLYPECTOMY (Hepatic Flexure, Distal Transverse Colon, Rectal);  Surgeon: Lamar CHRISTELLA Hollingshead, MD;  Location: [facility];  Service: Endoscopy;;   RADIOLOGY WITH ANESTHESIA N/A 12/16/2020   Procedure: IR WITH ANESTHESIA;  Surgeon: Dolphus Carrion, MD;  Location: [facility] OR;  Service: Radiology;  Laterality: N/A;   TEE WITHOUT CARDIOVERSION N/A 03/28/2016   Procedure: TRANSESOPHAGEAL ECHOCARDIOGRAM (TEE);  Surgeon: Dorise MARLA Fellers, MD;  Location[facility];  Service: Open Heart Surgery;  Laterality: N/A;   TONSILLECTOMY     TRIGGER FINGER RELEASE Left 10/15/2021   Procedure: RELEASE TRIGGER FINGER/A-1 PULLEY left middle or long finger;  Surgeon: Margrette Taft BRAVO, MD;  Location: [facility] ORS;  Service: Orthopedics;  Laterality: Left;     Current Outpatient Medications  Medication Sig Dispense Refill   ACCU-CHEK GUIDE test strip USE AS DIRECTED TO MONITOR BLOOD SUGAR (4) TIMES DAILY. 100 strip 6   acetaminophen  (TYLENOL ) 325 MG tablet Take 2 tablets (650 mg total) by mouth every 6 (six) hours as needed for mild pain (pain score 1-3) (or Fever >/= 101).     allopurinol  (ZYLOPRIM ) 100 MG tablet TAKE ONE TABLET BY MOUTH EVERY DAY 30 tablet 0   amLODipine  (NORVASC ) 10 MG tablet Take 10 mg by mouth daily.     atorvastatin  (LIPITOR ) 80 MG tablet TAKE 1 TABLET BY MOUTH ONCE DAILY. 30 tablet 0   BRILINTA  90 MG TABS tablet TAKE 1 TABLET BY MOUTH TWICE DAILY 180 tablet 5   Cholecalciferol (VITAMIN D3) 50 MCG (2000 UT) capsule Take 1 capsule (2,000 Units total) by mouth daily. 90 capsule 3   divalproex  (DEPAKOTE ) 250 MG DR tablet  Take 1 tablet (250 mg total) by mouth 2 (two) times daily. 60 tablet 11   escitalopram  (LEXAPRO ) 20 MG tablet Take 1 tablet (20 mg total) by mouth daily. 30 tablet 0   ferrous sulfate  325 (65 FE) MG tablet Take 1 tablet (325 mg total) by mouth daily with breakfast. 30 tablet 3   isosorbide  mononitrate (IMDUR ) 30 MG 24 hr tablet TAKE ONE TABLET BY MOUTH EVERY DAY 30 tablet 0   lamoTRIgine  (LAMICTAL ) 25 MG tablet Take 2 tablets (50 mg total) by mouth 2 (two) times daily. 120 tablet 11   Multiple Vitamin (MULTIVITAMIN WITH MINERALS) TABS tablet Take 1 tablet by mouth daily.     UNABLE TO FIND 1 each by Does not apply route daily. Med  Name: GLOVES 1B/M OR PRN 1 each 0   UNABLE TO FIND 1 each by Does not apply route daily. Med Name: PULL UPS 100/M OR PRN 1 each 0   UNABLE TO FIND 1 each by Does not apply route daily. Med Name: NUTRITIONAL SUPPLEMENT 180U/M OR PRN 1 each 0   UNABLE TO FIND 1 each by Does not apply route daily. Med Name: UNDER PADS/CHUX 30/M OR PRN 1 each 0   Vitamin D , Ergocalciferol , (DRISDOL ) 1.25 MG (50000 UNIT) CAPS capsule Take 1 capsule (50,000 Units total) by mouth every 7 (seven) days. 5 capsule 0   No current facility-administered medications for this visit.    Allergies:   Aspirin     Social History:  The patient  reports that he has quit smoking. His smoking use included cigarettes. He has a 15 pack-year smoking history. He has never used smokeless tobacco. He reports that he does not currently use alcohol. He reports that he does not currently use drugs after having used the following drugs: Marijuana.   Family History:  The patient's family history includes Arthritis in an other family member; Asthma in an other family member; Diabetes in his father and another family member; Hypertension in his father; Sleep apnea in his father; Stroke in his sister.    ROS:  Please see the history of present illness.   Otherwise, review of systems are positive for none.   All other  systems are reviewed and negative.    PHYSICAL EXAM: VS:  BP 127/82 (BP Location: Left Arm, Patient Position: Sitting)   Pulse 63   Ht 5' 7 (1.702 m)   Wt 135 lb (61.2 kg)   SpO2 99%   BMI 21.14 kg/m  , BMI Body mass index is 21.14 kg/m. GEN: Well nourished, well developed, in no acute distress  HEENT: normal  Neck: no JVD, carotid bruits, or masses Cardiac: RRR; no murmurs, rubs, or gallops,no edema  Respiratory:  clear to auscultation bilaterally, normal work of breathing GI: soft, nontender, nondistended, + BS MS: no deformity or atrophy  Skin: warm and dry, no rash Neuro:  Strength and sensation are intact Psych: euthymic mood, full affect Vascular: Femoral pulse is normal on the left and diminished on the right.  Distal pulses are not palpable.   EKG:  EKG is not ordered today.    Recent Labs: 03/02/2023: TSH 2.703 10/18/2023: ALT 14 11/14/2023: BUN 26; Creatinine, Ser 1.69; Potassium 4.8; Sodium 140 01/25/2024: Hemoglobin 10.3; Platelets 129    Lipid Panel    Component Value Date/Time   CHOL 111 01/11/2024 0848   TRIG 80 01/11/2024 0848   HDL 64 01/11/2024 0848   CHOLHDL 1.7 01/11/2024 0848   CHOLHDL 2.4 08/28/2022 0329   VLDL 14 08/28/2022 0329   LDLCALC 31 01/11/2024 0848      Wt Readings from Last 3 Encounters:  01/30/24 135 lb (61.2 kg)  01/15/24 135 lb (61.2 kg)  01/01/24 135 lb (61.2 kg)           No data to display            ASSESSMENT AND PLAN:  1.  Peripheral arterial disease: The patient has severe claudication and rest pain on the right side.  He has been extremely limited by his symptoms.  Fortunately, there is no ulceration or tissue loss.  However, this is still a limb threatening situation.  I recommend proceeding with abdominal aortogram with lower extremity angiography and possible endovascular intervention.  I  discussed the procedure in details as well as risk and benefits.  Planned access is via the left common femoral  artery. Will plan on 4 hours of hydration given chronic kidney disease. He takes Brilinta  90 mg twice daily.  2.  Coronary artery disease involving native coronary arteries without angina: Continue medical therapy.  3.  Essential hypertension: Blood pressure is controlled.  4.  Hyperlipidemia: Continue high-dose atorvastatin .  Most recent lipid profile showed an LDL of 31.  5.  Status post stroke: Stable  6.  Chronic kidney disease: Most recent creatinine was 1.69 with a GFR 46.    Disposition:   Proceed with angiography and follow-up after.  Signed,  Deatrice Cage, MD  01/30/2024 8:43 AM    [facility]

## 2024-01-30 NOTE — Telephone Encounter (Signed)
 Called and spoke to patient DPR, Bernarda and made her aware of patient procedure lower extremity angiography to be done in [place] on 7/16 @1 :00 pm .Advise DPR, Alicia and patient to call for any other questions. Understanding verbalized.

## 2024-01-30 NOTE — Telephone Encounter (Signed)
 Returned the call to the patient's sister, per the dpr. All questions have been answered. The patient will be staying overnight for observation.

## 2024-01-30 NOTE — Telephone Encounter (Signed)
 Olam, RN called in asking questions about pt's procedure.   Can procedure be changed to an earlier time?  About how many days prior does he need lab work because he did not have done today  Does he need to hold Brilinta  prior to procedure.

## 2024-01-30 NOTE — Patient Instructions (Signed)
 Medication Instructions:  No changes *If you need a refill on your cardiac medications before your next appointment, please call your pharmacy*  Lab Work: Your provider would like for you to have the following labs today: CBC and BMET  If you have labs (blood work) drawn today and your tests are completely normal, you will receive your results only by: MyChart Message (if you have MyChart) OR A paper copy in the mail If you have any lab test that is abnormal or we need to change your treatment, we will call you to review the results.  Testing/Procedures: Your physician has requested that you have a peripheral vascular angiogram. This exam is performed at the hospital. During this exam IV contrast is used to look at arterial blood flow. Please review the information sheet given for details.   Follow-Up: At [facility], you and your health needs are our priority.  As part of our continuing mission to provide you with exceptional heart care, our providers are all part of one team.  This team includes your primary Cardiologist (physician) and Advanced Practice Providers or APPs (Physician Assistants and Nurse Practitioners) who all work together to provide you with the care you need, when you need it.  Your next appointment:   Keep your follow up on 8/13 at 3:35pm   We recommend signing up for the patient portal called MyChart.  Sign up information is provided on this After Visit Summary.  MyChart is used to connect with patients for Virtual Visits (Telemedicine).  Patients are able to view lab/test results, encounter notes, upcoming appointments, etc.  Non-urgent messages can be sent to your provider as well.   To learn more about what you can do with MyChart, go to [email or web address].   Other Instructions  [place] [facility] A DEPT OF [place]. [facility] AT [address] A DEPT OF THE [place]. [facility] [address] [place] [address] Dept:  [phone] Loc: [phone]  Melvin Taylor  01/30/2024  You are scheduled for a Peripheral Angiogram on Wednesday, July 16 with Dr. Deatrice Cage.  1. Please arrive at the [facility] (Main Entrance A) at [facility]: [address] [place], [place] [address] at 9:00 AM (This time is 4 hour(s) before your procedure to ensure your preparation).   Free valet parking service is available. You will check in at ADMITTING. The support person will be asked to wait in the waiting room.  It is OK to have someone drop you off and come back when you are ready to be discharged.    Special note: Every effort is made to have your procedure done on time. Please understand that emergencies sometimes delay scheduled procedures.  2. Diet: Do not eat solid foods after midnight.  The patient may have clear liquids until 5am upon the day of the procedure.  3. Labs: You will need to have blood drawn on 01/30/24. You do not need to be fasting.  4. Medication instructions in preparation for your procedure: Nothing to hold  On the morning of your procedure, take your Brilinta /Ticagrelor  and any morning medicines NOT listed above.  You may use sips of water .  5. Plan to go home the same day, you will only stay overnight if medically necessary. 6. Bring a current list of your medications and current insurance cards. 7. You MUST have a responsible person to drive you home. 8. Someone MUST be with you the first 24 hours  after you arrive home or your discharge will be delayed. 9. Please wear clothes that are easy to get on and off and wear slip-on shoes.  Thank you for allowing us  to care for you!   --  Invasive Cardiovascular services

## 2024-01-30 NOTE — Telephone Encounter (Signed)
 Sister Versie) requesting a call back from RN Olam to follow-up on patient's office visit and next steps.

## 2024-01-30 NOTE — H&P (View-Only) (Signed)
 Cardiology Office Note   Date:  01/30/2024   ID:  Melvin Taylor, DOB 1966-04-06, MRN [id number]  PCP:  Bevely Doffing, FNP  Cardiologist:  Dr. Delford  No chief complaint on file.     History of Present Illness: Melvin Taylor is a 58 y.o. male who was referred by Dr. Nishan for evaluation management of peripheral arterial disease.  He has known history of coronary artery disease status post CABG in 2017, essential hypertension, hyperlipidemia, diabetes mellitus, previous stroke, obstructive sleep apnea, bipolar disorder and tobacco use. He had a fall last year that resulted in subdural hematoma which required surgery.  Most recent CT head showed no significant progression of hematoma.  He was cleared to resume Brilinta  90 mg twice daily.  The patient's mobility has been limited since he had his stroke and he uses a wheelchair occasionally.  Most of the time, he uses a walker but he has been limited recently over the last few months by severe bilateral leg pain worse on the right side.  He is only able to walk short distance.  He also has significant discomfort in the right foot especially at night when he is trying to sleep.  Both feet are cold to touch.  No lower extremity ulceration.  He underwent lower extremity arterial Doppler last month which showed an ABI of 0.66 on the right and 0.97 on the left.  He is accompanied today by the caregiver.    Past Medical History:  Diagnosis Date   Bipolar 1 disorder (HCC)    depression/anxiety   Bone spur    left heel   Cervical radiculopathy    Chronic back pain    Chronic chest wall pain    Chronic neck pain    Coronary artery disease    a. s/p CABG in 03/2016 with LIMA-LAD, SVG-D1, SVG-RCA, and Seq SVG-mid and distal OM   Diabetes mellitus    Type II   Hallucinations    long history of them   Headache(784.0)    History of gout    HTN (hypertension)    Insomnia    Neuropathy    Pain management    Polysubstance abuse  (HCC)    Right leg pain    chronic   Schizophrenia (HCC)    Seizures (HCC)    last sz between July 5-9th, 2016; epilepsy   Sleep apnea    Stroke (HCC)    Transfusion of blood product refused for religious reason     Past Surgical History:  Procedure Laterality Date   ANTERIOR CERVICAL DECOMP/DISCECTOMY FUSION N/A 12/14/2015   Procedure: ANTERIOR CERVICAL DECOMPRESSION/DISCECTOMY FUSION CERVICAL FIVE -SIX;  Surgeon: Victory Gunnels, MD;  Location: [facility];  Service: Neurosurgery;  Laterality: N/A;   BIOPSY  05/07/2015   Procedure: BIOPSY (Gastric);  Surgeon: Lamar CHRISTELLA Hollingshead, MD;  Location: [facility] ORS;  Service: Endoscopy;;   BURR HOLE FOR SUBDURAL HEMATOMA Right 10/24/2023   CARDIAC CATHETERIZATION N/A 03/07/2016   Procedure: Left Heart Cath and Coronary Angiography;  Surgeon: Victory LELON Sharps, MD;  Location: [facility] INVASIVE CV LAB;  Service: Cardiovascular;  Laterality: N/A;   COLONOSCOPY WITH PROPOFOL  N/A 05/07/2015   MFM:ejwrnonwpr diverticulosis, multiple colon polyps removed, tubular adenoma, serrated colon polyp. Next colonoscopy October 2019   COLONOSCOPY WITH PROPOFOL  N/A 08/02/2018   Procedure: COLONOSCOPY WITH PROPOFOL ;  Surgeon: Hollingshead Lamar CHRISTELLA, MD;  Location: [facility] SU[facility];  Service: Endoscopy;  Laterality: N/A;  12:00pm   CORONARY ARTERY BYPASS GRAFT  N/A 03/28/2016   Procedure: CORONARY ARTERY BYPASS GRAFTING (CABG) x 5 USING GREATER SAPHENOUS VEIN;  Surgeon: Dorise MARLA Fellers, MD;  Location: [facility] OR;  Service: Open Heart Surgery;  Laterality: N/A;   ENDOVEIN HARVEST OF GREATER SAPHENOUS VEIN Right 03/28/2016   Procedure: ENDOVEIN HARVEST OF GREATER SAPHENOUS VEIN;  Surgeon: Dorise MARLA Fellers, MD;  Location: [facility] OR;  Service: Open Heart Surgery;  Laterality: Right;   ESOPHAGEAL DILATION N/A 05/07/2015   Procedure: ESOPHAGEAL DILATION WITH 56FR MALONEY DILATOR;  Surgeon: Lamar CHRISTELLA Hollingshead, MD;  Location: [facility] ORS;  Service: Endoscopy;  Laterality: N/A;   ESOPHAGOGASTRODUODENOSCOPY (EGD) WITH PROPOFOL   N/A 05/07/2015   RMR: Status post dilation of normal esophagus. Gastritis.   IR CT HEAD LTD  12/17/2020   IR CT HEAD LTD  12/17/2020   IR INTRA CRAN STENT  12/17/2020   IR PERCUTANEOUS ART THROMBECTOMY/INFUSION INTRACRANIAL INC DIAG ANGIO  12/17/2020   IR RADIOLOGIST EVAL & MGMT  03/25/2021   KNEE SURGERY Left    arthroscopy   MANDIBLE FRACTURE SURGERY     POLYPECTOMY  05/07/2015   Procedure: POLYPECTOMY (Hepatic Flexure, Distal Transverse Colon, Rectal);  Surgeon: Lamar CHRISTELLA Hollingshead, MD;  Location: [facility]S;  Service: Endoscopy;;   RADIOLOGY WITH ANESTHESIA N/A 12/16/2020   Procedure: IR WITH ANESTHESIA;  Surgeon: Dolphus Carrion, MD;  Location: [facility] OR;  Service: Radiology;  Laterality: N/A;   TEE WITHOUT CARDIOVERSION N/A 03/28/2016   Procedure: TRANSESOPHAGEAL ECHOCARDIOGRAM (TEE);  Surgeon: Dorise MARLA Fellers, MD;  Location[facility];  Service: Open Heart Surgery;  Laterality: N/A;   TONSILLECTOMY     TRIGGER FINGER RELEASE Left 10/15/2021   Procedure: RELEASE TRIGGER FINGER/A-1 PULLEY left middle or long finger;  Surgeon: Margrette Taft BRAVO, MD;  Location: [facility] ORS;  Service: Orthopedics;  Laterality: Left;     Current Outpatient Medications  Medication Sig Dispense Refill   ACCU-CHEK GUIDE test strip USE AS DIRECTED TO MONITOR BLOOD SUGAR (4) TIMES DAILY. 100 strip 6   acetaminophen  (TYLENOL ) 325 MG tablet Take 2 tablets (650 mg total) by mouth every 6 (six) hours as needed for mild pain (pain score 1-3) (or Fever >/= 101).     allopurinol  (ZYLOPRIM ) 100 MG tablet TAKE ONE TABLET BY MOUTH EVERY DAY 30 tablet 0   amLODipine  (NORVASC ) 10 MG tablet Take 10 mg by mouth daily.     atorvastatin  (LIPITOR ) 80 MG tablet TAKE 1 TABLET BY MOUTH ONCE DAILY. 30 tablet 0   BRILINTA  90 MG TABS tablet TAKE 1 TABLET BY MOUTH TWICE DAILY 180 tablet 5   Cholecalciferol (VITAMIN D3) 50 MCG (2000 UT) capsule Take 1 capsule (2,000 Units total) by mouth daily. 90 capsule 3   divalproex  (DEPAKOTE ) 250 MG DR tablet  Take 1 tablet (250 mg total) by mouth 2 (two) times daily. 60 tablet 11   escitalopram  (LEXAPRO ) 20 MG tablet Take 1 tablet (20 mg total) by mouth daily. 30 tablet 0   ferrous sulfate  325 (65 FE) MG tablet Take 1 tablet (325 mg total) by mouth daily with breakfast. 30 tablet 3   isosorbide  mononitrate (IMDUR ) 30 MG 24 hr tablet TAKE ONE TABLET BY MOUTH EVERY DAY 30 tablet 0   lamoTRIgine  (LAMICTAL ) 25 MG tablet Take 2 tablets (50 mg total) by mouth 2 (two) times daily. 120 tablet 11   Multiple Vitamin (MULTIVITAMIN WITH MINERALS) TABS tablet Take 1 tablet by mouth daily.     UNABLE TO FIND 1 each by Does not apply route daily. Med  Name: GLOVES 1B/M OR PRN 1 each 0   UNABLE TO FIND 1 each by Does not apply route daily. Med Name: PULL UPS 100/M OR PRN 1 each 0   UNABLE TO FIND 1 each by Does not apply route daily. Med Name: NUTRITIONAL SUPPLEMENT 180U/M OR PRN 1 each 0   UNABLE TO FIND 1 each by Does not apply route daily. Med Name: UNDER PADS/CHUX 30/M OR PRN 1 each 0   Vitamin D , Ergocalciferol , (DRISDOL ) 1.25 MG (50000 UNIT) CAPS capsule Take 1 capsule (50,000 Units total) by mouth every 7 (seven) days. 5 capsule 0   No current facility-administered medications for this visit.    Allergies:   Aspirin     Social History:  The patient  reports that he has quit smoking. His smoking use included cigarettes. He has a 15 pack-year smoking history. He has never used smokeless tobacco. He reports that he does not currently use alcohol. He reports that he does not currently use drugs after having used the following drugs: Marijuana.   Family History:  The patient's family history includes Arthritis in an other family member; Asthma in an other family member; Diabetes in his father and another family member; Hypertension in his father; Sleep apnea in his father; Stroke in his sister.    ROS:  Please see the history of present illness.   Otherwise, review of systems are positive for none.   All other  systems are reviewed and negative.    PHYSICAL EXAM: VS:  BP 127/82 (BP Location: Left Arm, Patient Position: Sitting)   Pulse 63   Ht 5' 7 (1.702 m)   Wt 135 lb (61.2 kg)   SpO2 99%   BMI 21.14 kg/m  , BMI Body mass index is 21.14 kg/m. GEN: Well nourished, well developed, in no acute distress  HEENT: normal  Neck: no JVD, carotid bruits, or masses Cardiac: RRR; no murmurs, rubs, or gallops,no edema  Respiratory:  clear to auscultation bilaterally, normal work of breathing GI: soft, nontender, nondistended, + BS MS: no deformity or atrophy  Skin: warm and dry, no rash Neuro:  Strength and sensation are intact Psych: euthymic mood, full affect Vascular: Femoral pulse is normal on the left and diminished on the right.  Distal pulses are not palpable.   EKG:  EKG is not ordered today.    Recent Labs: 03/02/2023: TSH 2.703 10/18/2023: ALT 14 11/14/2023: BUN 26; Creatinine, Ser 1.69; Potassium 4.8; Sodium 140 01/25/2024: Hemoglobin 10.3; Platelets 129    Lipid Panel    Component Value Date/Time   CHOL 111 01/11/2024 0848   TRIG 80 01/11/2024 0848   HDL 64 01/11/2024 0848   CHOLHDL 1.7 01/11/2024 0848   CHOLHDL 2.4 08/28/2022 0329   VLDL 14 08/28/2022 0329   LDLCALC 31 01/11/2024 0848      Wt Readings from Last 3 Encounters:  01/30/24 135 lb (61.2 kg)  01/15/24 135 lb (61.2 kg)  01/01/24 135 lb (61.2 kg)           No data to display            ASSESSMENT AND PLAN:  1.  Peripheral arterial disease: The patient has severe claudication and rest pain on the right side.  He has been extremely limited by his symptoms.  Fortunately, there is no ulceration or tissue loss.  However, this is still a limb threatening situation.  I recommend proceeding with abdominal aortogram with lower extremity angiography and possible endovascular intervention.  I  discussed the procedure in details as well as risk and benefits.  Planned access is via the left common femoral  artery. Will plan on 4 hours of hydration given chronic kidney disease. He takes Brilinta  90 mg twice daily.  2.  Coronary artery disease involving native coronary arteries without angina: Continue medical therapy.  3.  Essential hypertension: Blood pressure is controlled.  4.  Hyperlipidemia: Continue high-dose atorvastatin .  Most recent lipid profile showed an LDL of 31.  5.  Status post stroke: Stable  6.  Chronic kidney disease: Most recent creatinine was 1.69 with a GFR 46.    Disposition:   Proceed with angiography and follow-up after.  Signed,  Deatrice Cage, MD  01/30/2024 8:43 AM    [facility]

## 2024-02-06 ENCOUNTER — Institutional Professional Consult (permissible substitution): Admitting: Cardiovascular Disease

## 2024-02-07 ENCOUNTER — Other Ambulatory Visit: Payer: Self-pay

## 2024-02-07 DIAGNOSIS — D638 Anemia in other chronic diseases classified elsewhere: Secondary | ICD-10-CM

## 2024-02-08 ENCOUNTER — Inpatient Hospital Stay: Attending: Hematology

## 2024-02-08 ENCOUNTER — Inpatient Hospital Stay

## 2024-02-08 DIAGNOSIS — N1832 Chronic kidney disease, stage 3b: Secondary | ICD-10-CM | POA: Diagnosis present

## 2024-02-08 DIAGNOSIS — D631 Anemia in chronic kidney disease: Secondary | ICD-10-CM | POA: Diagnosis present

## 2024-02-08 DIAGNOSIS — D638 Anemia in other chronic diseases classified elsewhere: Secondary | ICD-10-CM

## 2024-02-08 LAB — CBC
HCT: 33.5 % — ABNORMAL LOW (ref 39.0–52.0)
Hemoglobin: 11.2 g/dL — ABNORMAL LOW (ref 13.0–17.0)
MCH: 31 pg (ref 26.0–34.0)
MCHC: 33.4 g/dL (ref 30.0–36.0)
MCV: 92.8 fL (ref 80.0–100.0)
Platelets: 194 K/uL (ref 150–400)
RBC: 3.61 MIL/uL — ABNORMAL LOW (ref 4.22–5.81)
RDW: 14.6 % (ref 11.5–15.5)
WBC: 4.1 K/uL (ref 4.0–10.5)
nRBC: 0 % (ref 0.0–0.2)

## 2024-02-08 NOTE — Progress Notes (Signed)
 Per order parameter patient does not need Retacrit  today for Hgb of 11.2.

## 2024-02-13 ENCOUNTER — Telehealth: Payer: Self-pay | Admitting: *Deleted

## 2024-02-13 NOTE — Telephone Encounter (Addendum)
 LEA scheduled at [facility] for: Wednesday January 15, 2024 1 PM Arrival time [facility] Main Entrance A at: 9 AM-pre-procedure hydration  Nothing to eat after midnight prior to procedure, clear liquids until 5 AM day of procedure.  Medication instructions: -Usual morning medications can be taken with sips of water  including Brilinta  90 mg.  Plan to go home the same day, you will only stay overnight if medically necessary.  You must have responsible adult to drive you home.  Someone must be with you the first 24 hours after you arrive home.  Reviewed procedure instructions/discussed pre-procedure hydration/BMP AM of procedure with Alecia (DPR),   BMP not done 02/08/24-will plan to get on arrival to Short Stay 02/14/24.

## 2024-02-14 ENCOUNTER — Ambulatory Visit (HOSPITAL_COMMUNITY)
Admission: RE | Admit: 2024-02-14 | Discharge: 2024-02-14 | Disposition: A | Discharge status: Home / Self Care | Source: Ambulatory Visit | Attending: Cardiovascular Disease | Admitting: Cardiovascular Disease

## 2024-02-14 ENCOUNTER — Other Ambulatory Visit: Payer: Self-pay

## 2024-02-14 ENCOUNTER — Encounter (HOSPITAL_COMMUNITY)
Admission: RE | Disposition: A | Discharge status: Home / Self Care | Payer: Self-pay | Source: Ambulatory Visit | Attending: Cardiovascular Disease

## 2024-02-14 DIAGNOSIS — E1122 Type 2 diabetes mellitus with diabetic chronic kidney disease: Secondary | ICD-10-CM | POA: Insufficient documentation

## 2024-02-14 DIAGNOSIS — I2510 Atherosclerotic heart disease of native coronary artery without angina pectoris: Secondary | ICD-10-CM | POA: Diagnosis not present

## 2024-02-14 DIAGNOSIS — I739 Peripheral vascular disease, unspecified: Secondary | ICD-10-CM

## 2024-02-14 DIAGNOSIS — N289 Disorder of kidney and ureter, unspecified: Secondary | ICD-10-CM

## 2024-02-14 DIAGNOSIS — I129 Hypertensive chronic kidney disease with stage 1 through stage 4 chronic kidney disease, or unspecified chronic kidney disease: Secondary | ICD-10-CM | POA: Insufficient documentation

## 2024-02-14 DIAGNOSIS — E1151 Type 2 diabetes mellitus with diabetic peripheral angiopathy without gangrene: Secondary | ICD-10-CM | POA: Diagnosis not present

## 2024-02-14 DIAGNOSIS — E785 Hyperlipidemia, unspecified: Secondary | ICD-10-CM | POA: Insufficient documentation

## 2024-02-14 DIAGNOSIS — Z7902 Long term (current) use of antithrombotics/antiplatelets: Secondary | ICD-10-CM | POA: Diagnosis not present

## 2024-02-14 DIAGNOSIS — N189 Chronic kidney disease, unspecified: Secondary | ICD-10-CM | POA: Diagnosis not present

## 2024-02-14 DIAGNOSIS — Z87891 Personal history of nicotine dependence: Secondary | ICD-10-CM | POA: Diagnosis not present

## 2024-02-14 DIAGNOSIS — Z01812 Encounter for preprocedural laboratory examination: Secondary | ICD-10-CM

## 2024-02-14 DIAGNOSIS — I70221 Atherosclerosis of native arteries of extremities with rest pain, right leg: Secondary | ICD-10-CM | POA: Diagnosis not present

## 2024-02-14 DIAGNOSIS — Z8673 Personal history of transient ischemic attack (TIA), and cerebral infarction without residual deficits: Secondary | ICD-10-CM | POA: Diagnosis not present

## 2024-02-14 DIAGNOSIS — Z91199 Patient's noncompliance with other medical treatment and regimen due to unspecified reason: Secondary | ICD-10-CM

## 2024-02-14 DIAGNOSIS — Z79899 Other long term (current) drug therapy: Secondary | ICD-10-CM | POA: Insufficient documentation

## 2024-02-14 HISTORY — PX: ABDOMINAL AORTOGRAM: CATH118222

## 2024-02-14 HISTORY — PX: LOWER EXTREMITY ANGIOGRAPHY: CATH118251

## 2024-02-14 HISTORY — PX: LOWER EXTREMITY INTERVENTION: CATH118252

## 2024-02-14 LAB — BASIC METABOLIC PANEL WITH GFR
Anion gap: 7 (ref 5–15)
BUN: 17 mg/dL (ref 6–20)
CO2: 26 mmol/L (ref 22–32)
Calcium: 8.9 mg/dL (ref 8.9–10.3)
Chloride: 105 mmol/L (ref 98–111)
Creatinine, Ser: 1.45 mg/dL — ABNORMAL HIGH (ref 0.61–1.24)
GFR, Estimated: 56 mL/min — ABNORMAL LOW (ref >60)
Glucose, Bld: 73 mg/dL (ref 70–99)
Potassium: 3.9 mmol/L (ref 3.5–5.1)
Sodium: 138 mmol/L (ref 135–145)

## 2024-02-14 LAB — NO BLOOD PRODUCTS

## 2024-02-14 SURGERY — LOWER EXTREMITY ANGIOGRAPHY
Anesthesia: LOCAL | Laterality: Right

## 2024-02-14 MED ORDER — MIDAZOLAM HCL 2 MG/2ML IJ SOLN
INTRAMUSCULAR | Status: AC
  Filled 2024-02-14: qty 2

## 2024-02-14 MED ORDER — SODIUM CHLORIDE 0.9 % WEIGHT BASED INFUSION
3.0000 mL/kg/h | INTRAVENOUS | Status: AC
  Administered 2024-02-14: 3 mL/kg/h via INTRAVENOUS

## 2024-02-14 MED ORDER — LABETALOL HCL 5 MG/ML IV SOLN: 10.0000 mg | INTRAVENOUS | Status: DC | PRN

## 2024-02-14 MED ORDER — FENTANYL CITRATE (PF) 100 MCG/2ML IJ SOLN
INTRAMUSCULAR | Status: DC | PRN
  Administered 2024-02-14: 25 ug via INTRAVENOUS

## 2024-02-14 MED ORDER — IODIXANOL 320 MG/ML IV SOLN
INTRAVENOUS | Status: DC | PRN
  Administered 2024-02-14: 55 mL

## 2024-02-14 MED ORDER — ACETAMINOPHEN 325 MG PO TABS
650.0000 mg | ORAL_TABLET | ORAL | Status: DC | PRN
  Administered 2024-02-14: 650 mg via ORAL
  Filled 2024-02-14: qty 2

## 2024-02-14 MED ORDER — SODIUM CHLORIDE 0.9 % WEIGHT BASED INFUSION: 1.0000 mL/kg/h | INTRAVENOUS | Status: DC

## 2024-02-14 MED ORDER — LIDOCAINE HCL (PF) 1 % IJ SOLN
INTRAMUSCULAR | Status: AC
  Filled 2024-02-14: qty 30

## 2024-02-14 MED ORDER — LIDOCAINE HCL (PF) 1 % IJ SOLN
INTRAMUSCULAR | Status: DC | PRN
  Administered 2024-02-14: 15 mL

## 2024-02-14 MED ORDER — SODIUM CHLORIDE 0.9% FLUSH: 3.0000 mL | Freq: Two times a day (BID) | INTRAVENOUS | Status: DC

## 2024-02-14 MED ORDER — HEPARIN (PORCINE) IN NACL 1000-0.9 UT/500ML-% IV SOLN
INTRAVENOUS | Status: DC | PRN
  Administered 2024-02-14 (×2): 500 mL

## 2024-02-14 MED ORDER — ONDANSETRON HCL 4 MG/2ML IJ SOLN: 4.0000 mg | Freq: Four times a day (QID) | INTRAMUSCULAR | Status: DC | PRN

## 2024-02-14 MED ORDER — SODIUM CHLORIDE 0.9% FLUSH
3.0000 mL | INTRAVENOUS | Status: DC | PRN
Start: 2024-02-14 — End: 2024-02-14

## 2024-02-14 MED ORDER — SODIUM CHLORIDE 0.9 % IV SOLN
250.0000 mL | INTRAVENOUS | Status: DC | PRN
Start: 2024-02-14 — End: 2024-02-14

## 2024-02-14 MED ORDER — SODIUM CHLORIDE 0.9 % IV SOLN: INTRAVENOUS | Status: DC

## 2024-02-14 MED ORDER — HEPARIN SODIUM (PORCINE) 1000 UNIT/ML IJ SOLN
INTRAMUSCULAR | Status: DC | PRN
  Administered 2024-02-14: 6000 [IU] via INTRAVENOUS

## 2024-02-14 MED ORDER — FENTANYL CITRATE (PF) 100 MCG/2ML IJ SOLN
INTRAMUSCULAR | Status: AC
  Filled 2024-02-14: qty 2

## 2024-02-14 MED ORDER — MIDAZOLAM HCL 2 MG/2ML IJ SOLN
INTRAMUSCULAR | Status: DC | PRN
  Administered 2024-02-14: 1 mg via INTRAVENOUS

## 2024-02-14 SURGICAL SUPPLY — 18 items
BALLOON MUSTANG 6.0X40 135 (BALLOONS) IMPLANT
BALLOON MUSTANG 7.0X40 135 (BALLOONS) IMPLANT
CATH ANGIO 5F PIGTAIL 65CM (CATHETERS) IMPLANT
CATH CROSS OVER TEMPO 5F (CATHETERS) IMPLANT
CATH STRAIGHT 5FR 65CM (CATHETERS) IMPLANT
CLOSURE PERCLOSE PROSTYLE (VASCULAR PRODUCTS) IMPLANT
GLIDEWIRE ADV .035X260CM (WIRE) IMPLANT
KIT ENCORE 26 ADVANTAGE (KITS) IMPLANT
KIT MICROPUNCTURE NIT STIFF (SHEATH) IMPLANT
KIT SINGLE USE MANIFOLD (KITS) IMPLANT
KIT SYRINGE INJ CVI SPIKEX1 (MISCELLANEOUS) IMPLANT
SET ATX-X65L (MISCELLANEOUS) IMPLANT
SHEATH CATAPULT 6F 45 MP (SHEATH) IMPLANT
SHEATH PINNACLE 5F 10CM (SHEATH) IMPLANT
SHEATH PROBE COVER 6X72 (BAG) IMPLANT
STENT ELUVIA 7X40X130 (Permanent Stent) IMPLANT
TRAY PV CATH (CUSTOM PROCEDURE TRAY) ×3 IMPLANT
WIRE HITORQ VERSACORE ST 145CM (WIRE) IMPLANT

## 2024-02-14 NOTE — Interval H&P Note (Signed)
 History and Physical Interval Note:  02/14/2024 1:48 PM  Melvin Taylor  has presented today for surgery, with the diagnosis of pad.  The various methods of treatment have been discussed with the patient and family. After consideration of risks, benefits and other options for treatment, the patient has consented to  Procedure(s): Lower Extremity Angiography (N/A) as a surgical intervention.  The patient's history has been reviewed, patient examined, no change in status, stable for surgery.  I have reviewed the patient's chart and labs.  Questions were answered to the patient's satisfaction.     [name]

## 2024-02-14 NOTE — Discharge Instructions (Signed)
 Femoral Site Care The following information offers guidance on how to care for yourself after your procedure. Your health care provider may also give you more specific instructions. If you have problems or questions, contact your health care provider. What can I expect after the procedure? After the procedure, it is common to have bruising and tenderness at the incision site. This usually fades within 1-2 weeks. Follow these instructions at home: Incision site care  Follow instructions from your health care provider about how to take care of your incision site. Make sure you: Wash your hands with soap and water for at least 20 seconds before and after you change your bandage (dressing). If soap and water are not available, use hand sanitizer. Remove your dressing in 24 hours. Leave stitches (sutures), skin glue, or adhesive strips in place. These skin closures may need to stay in place for 2 weeks or longer. If adhesive strip edges start to loosen and curl up, you may trim the loose edges. Do not remove adhesive strips completely unless your health care provider tells you to do that. Do not take baths, swim, or use a hot tub for at least 1 week. You may shower 24 hours after the procedure or as told by your health care provider. Gently wash the incision site with plain soap and water. Pat the area dry with a clean towel. Do not rub the site. This may cause bleeding. Do not apply powder or lotion to the site. Keep the site clean and dry. Check your femoral site every day for signs of infection. Check for: Redness, swelling, or pain. Fluid or blood. Warmth. Pus or a bad smell. Activity If you were given a sedative during the procedure, it can affect you for several hours. Do not drive or operate machinery until your health care provider says that it is safe. Rest as told by your health care provider. Avoid sitting for a long time without moving. Get up to take short walks every 1-2 hours. This  is important to improve blood flow and breathing. Ask for help if you feel weak or unsteady. Return to your normal activities as told by your health care provider. Ask your health care provider what activities are safe for you and when you can return to work. Avoid activities that take a lot of effort for the first 2-3 days after your procedure, or as long as directed. Do not lift anything that is heavier than 10 lb (4.5 kg), or the limit that you are told, until your health care provider says that it is safe. General instructions Take over-the-counter and prescription medicines only as told by your health care provider. If you will be going home right after the procedure, plan to have a responsible adult care for you for the time you are told. This is important. Keep all follow-up visits. This is important. Contact a health care provider if: You have a fever or chills. You have any of these signs of infection at your incision site: Redness, swelling, or pain. Fluid or blood. Warmth. Pus or a bad smell. Get help right away if: The incision area swells very fast. The incision area is bleeding, and the bleeding does not stop when you hold steady pressure on the area. Your leg or foot becomes pale, cool, tingly, or numb. These symptoms may represent a serious problem that is an emergency. Do not wait to see if the symptoms will go away. Get medical help right away. Call your local emergency  services (911 in the [place] Do not drive yourself to the hospital. Summary After the procedure, it is common to have bruising and tenderness that fade within 1-2 weeks. Check your femoral site every day for signs of infection. Do not lift anything that is heavier than 10 lb (4.5 kg), or the limit that you are told, until your health care provider says that it is safe. Get help right away if the incision area swells very fast, you have bleeding at the incision area that does not stop, or your leg or foot  becomes pale, cool, or numb. This information is not intended to replace advice given to you by your health care provider. Make sure you discuss any questions you have with your health care provider. Document Revised: 04/07/2021 Document Reviewed: 09/06/2020 Elsevier Patient Education  2024 [employer].

## 2024-02-15 ENCOUNTER — Encounter (HOSPITAL_COMMUNITY): Payer: Self-pay | Admitting: Cardiovascular Disease

## 2024-02-15 ENCOUNTER — Other Ambulatory Visit: Payer: Self-pay | Admitting: *Deleted

## 2024-02-15 DIAGNOSIS — I739 Peripheral vascular disease, unspecified: Secondary | ICD-10-CM

## 2024-02-16 ENCOUNTER — Other Ambulatory Visit: Payer: Self-pay | Admitting: Internal Medicine

## 2024-02-21 ENCOUNTER — Other Ambulatory Visit: Payer: Self-pay

## 2024-02-21 DIAGNOSIS — D638 Anemia in other chronic diseases classified elsewhere: Secondary | ICD-10-CM

## 2024-02-22 ENCOUNTER — Inpatient Hospital Stay

## 2024-02-22 ENCOUNTER — Ambulatory Visit: Payer: Self-pay

## 2024-02-22 ENCOUNTER — Other Ambulatory Visit: Payer: Self-pay

## 2024-02-22 VITALS — BP 104/74 | HR 56 | Temp 98.1°F | Resp 16

## 2024-02-22 DIAGNOSIS — D638 Anemia in other chronic diseases classified elsewhere: Secondary | ICD-10-CM

## 2024-02-22 DIAGNOSIS — N1832 Chronic kidney disease, stage 3b: Secondary | ICD-10-CM | POA: Diagnosis not present

## 2024-02-22 DIAGNOSIS — F419 Anxiety disorder, unspecified: Secondary | ICD-10-CM

## 2024-02-22 DIAGNOSIS — E782 Mixed hyperlipidemia: Secondary | ICD-10-CM

## 2024-02-22 DIAGNOSIS — I2510 Atherosclerotic heart disease of native coronary artery without angina pectoris: Secondary | ICD-10-CM

## 2024-02-22 DIAGNOSIS — I10 Essential (primary) hypertension: Secondary | ICD-10-CM

## 2024-02-22 LAB — CBC
HCT: 31.1 % — ABNORMAL LOW (ref 39.0–52.0)
Hemoglobin: 10.4 g/dL — ABNORMAL LOW (ref 13.0–17.0)
MCH: 30.9 pg (ref 26.0–34.0)
MCHC: 33.4 g/dL (ref 30.0–36.0)
MCV: 92.3 fL (ref 80.0–100.0)
Platelets: 169 K/uL (ref 150–400)
RBC: 3.37 MIL/uL — ABNORMAL LOW (ref 4.22–5.81)
RDW: 14.4 % (ref 11.5–15.5)
WBC: 4.1 K/uL (ref 4.0–10.5)
nRBC: 0 % (ref 0.0–0.2)

## 2024-02-22 MED ORDER — EPOETIN ALFA-EPBX 2000 UNIT/ML IJ SOLN
2000.0000 [IU] | Freq: Once | INTRAMUSCULAR | Status: AC
  Administered 2024-02-22: 2000 [IU] via SUBCUTANEOUS
  Filled 2024-02-22: qty 1

## 2024-02-22 MED ORDER — AMLODIPINE BESYLATE 10 MG PO TABS: 10.0000 mg | ORAL_TABLET | Freq: Every day | ORAL | 3 refills | Status: DC

## 2024-02-22 MED ORDER — ALLOPURINOL 100 MG PO TABS: 100.0000 mg | ORAL_TABLET | Freq: Every day | ORAL | 5 refills | Status: DC

## 2024-02-22 MED ORDER — EPOETIN ALFA-EPBX 3000 UNIT/ML IJ SOLN
3000.0000 [IU] | Freq: Once | INTRAMUSCULAR | Status: AC
  Administered 2024-02-22: 3000 [IU] via SUBCUTANEOUS
  Filled 2024-02-22: qty 1

## 2024-02-22 MED ORDER — EPOETIN ALFA-EPBX 10000 UNIT/ML IJ SOLN: 5000.0000 [IU] | Freq: Once | INTRAMUSCULAR | Status: DC

## 2024-02-22 MED ORDER — ESCITALOPRAM OXALATE 20 MG PO TABS: 20.0000 mg | ORAL_TABLET | Freq: Every day | ORAL | 3 refills | Status: DC

## 2024-02-22 MED ORDER — ATORVASTATIN CALCIUM 80 MG PO TABS: 80.0000 mg | ORAL_TABLET | Freq: Every day | ORAL | 3 refills | Status: DC

## 2024-02-22 MED ORDER — ISOSORBIDE MONONITRATE ER 30 MG PO TB24: 30.0000 mg | ORAL_TABLET | Freq: Every morning | ORAL | 3 refills | Status: DC

## 2024-02-22 NOTE — Patient Instructions (Signed)
 [facility] [place] - A DEPT OF [facility]  Discharge Instructions: Thank you for choosing [facility] to provide your oncology and hematology care.  If you have a lab appointment with the Cancer Center - please note that after April 8th, 2024, all labs will be drawn in the cancer center.  You do not have to check in or register with the main entrance as you have in the past but will complete your check-in in the cancer center.  Wear comfortable clothing and clothing appropriate for easy access to any Portacath or PICC line.   We strive to give you quality time with your provider. You may need to reschedule your appointment if you arrive late (15 or more minutes).  Arriving late affects you and other patients whose appointments are after yours.  Also, if you miss three or more appointments without notifying the office, you may be dismissed from the clinic at the provider's discretion.      For prescription refill requests, have your pharmacy contact our office and allow 72 hours for refills to be completed.    Today you received the following injection Retacrit   To help prevent nausea and vomiting after your treatment, we encourage you to take your nausea medication as directed.  BELOW ARE SYMPTOMS THAT SHOULD BE REPORTED IMMEDIATELY: *FEVER GREATER THAN 100.4 F (38 C) OR HIGHER *CHILLS OR SWEATING *NAUSEA AND VOMITING THAT IS NOT CONTROLLED WITH YOUR NAUSEA MEDICATION *UNUSUAL SHORTNESS OF BREATH *UNUSUAL BRUISING OR BLEEDING *URINARY PROBLEMS (pain or burning when urinating, or frequent urination) *BOWEL PROBLEMS (unusual diarrhea, constipation, pain near the anus) TENDERNESS IN MOUTH AND THROAT WITH OR WITHOUT PRESENCE OF ULCERS (sore throat, sores in mouth, or a toothache) UNUSUAL RASH, SWELLING OR PAIN  UNUSUAL VAGINAL DISCHARGE OR ITCHING   Items with * indicate a potential emergency and should be followed up as soon as possible or go to the  Emergency Department if any problems should occur.  Please show the CHEMOTHERAPY ALERT CARD or IMMUNOTHERAPY ALERT CARD at check-in to the Emergency Department and triage nurse.  Should you have questions after your visit or need to cancel or reschedule your appointment, please contact [facility] [place] - A DEPT OF Eligha Brideg[facility] [phone]  and follow the prompts.  Office hours are 8:00 a.m. to 4:30 p.m. Monday - Friday. Please note that voicemails left after 4:00 p.m. may not be returned until the following business day.  We are closed weekends and major holidays. You have access to a nurse at all times for urgent questions. Please call the main number to the clinic [phone] and follow the prompts.  For any non-urgent questions, you may also contact your provider using MyChart. We now offer e-Visits for anyone 61 and older to request care online for non-urgent symptoms. For details visit [email or web address].   Also download the MyChart app! Go to the app store, search "MyChart", open the app, select [place], and log in with your MyChart username and password.

## 2024-02-22 NOTE — Telephone Encounter (Signed)
 FYI Only or Action Required?: FYI only for provider.  Patient was last seen in primary care on 01/11/2024 by Melvin Manus BRAVO, MD.  Called Nurse Triage reporting Leg Pain.  Symptoms began several days ago.  Interventions attempted: Other: Will contact surgeon/office that did proceedure.  Symptoms are: gradually worsening.  Triage Disposition: See HCP Within 4 Hours (Or PCP Triage)  Patient/caregiver understands and will follow disposition?: Yes  **Patient recently had surgery on 7/16, now in pain-POA will contact that office to notify provider of symptoms, and will follow-up with PCP if needed**          Copied from CRM #[id number]. Topic: Clinical - Red Word Triage >> Feb 22, 2024  9:34 AM Melvin Taylor wrote: [facility] that prompted transfer to Nurse Triage: Received call from POA of patient, Melvin Taylor, states patient reports right leg and foot pain with some swelling. Previous surgery at [facility], on 02/14/24 stent placement due to blockage. Reason for Disposition  Major surgery in past month  Answer Assessment - Initial Assessment Questions 1. ONSET: When did the pain start?       Patient had surgery on 7/16 stent placed for PAD, now complaining of right foot/leg pain.    2. LOCATION: Where is the pain located?       Right leg/foot pain   3. PAIN: How bad is the pain?    (Scale 1-10; or mild, moderate, severe)      POA calling, not currently with patient.   4. WORK OR EXERCISE: Has there been any recent work or exercise that involved this part of the body?      7/16 stent placed   5. CAUSE: What do you think is causing the leg pain?     Recent procedure.    6. OTHER SYMPTOMS: Do you have any other symptoms? (e.g., chest pain, back pain, breathing difficulty, swelling, rash, fever, numbness, weakness)  Mild swelling, right leg, no other symptoms noted. POA was advised to contact the provider/providers office that did the procedure to make them aware of the  symptoms, and to follow up if needed with PCP. She agrees with plan of care and will reach out to that office.  Protocols used: Leg Pain-A-AH

## 2024-02-22 NOTE — Telephone Encounter (Signed)
 Wrong office. Unsure where that provider is located.

## 2024-02-22 NOTE — Progress Notes (Signed)
 Retacrit injection given per orders. Patient tolerated it well without problems. Vitals stable and discharged home from clinic via wheelchair. Follow up as scheduled.

## 2024-02-23 ENCOUNTER — Telehealth: Payer: Self-pay | Admitting: Cardiovascular Disease

## 2024-02-23 ENCOUNTER — Ambulatory Visit: Admitting: Podiatry

## 2024-02-23 NOTE — Telephone Encounter (Signed)
 Pt c/o swelling/edema: STAT if pt has developed SOB within 24 hours  If swelling, where is the swelling located? Leg mild swelling - when press leg ,there is an indention- having so much pain in his leg, it is unbearable- On a scales 1 to 10, she says the pain is 10  How much weight have you gained and in what time span?   Have you gained 2 pounds in a day or 5 pounds in a week?   Do you have a log of your daily weights (if so, list)?   Are you currently taking a fluid pill?  no  Are you currently SOB? no  Have you traveled recently in a car or plane for an extended period of time?

## 2024-02-23 NOTE — Telephone Encounter (Signed)
 Patient identification verified by 2 forms.   Called and spoke to patient's sister.   Patient's sister states:  -Right leg is swollen  -Pt does not have transportation to the hospital   -[name] is upset she did not get a call back yesterday about this pt.               Interventions/Plan: -She will try to get the pt to the hospital to be evaluated due to the recent procedure by Dr. Darron and the swelling/pain in his right leg.   -[name] aware Dr. Darron is currently out of the office.   Reviewed ED warning signs/precautions  Patient agrees with plan, no questions at this time

## 2024-02-23 NOTE — Telephone Encounter (Signed)
 The patient should go to the nearest emergency department for further evaluation.  If he does not have transportation to take him to the ED in timely fashion, he should call 911.  Lonni Hanson, MD [facility]

## 2024-02-23 NOTE — Telephone Encounter (Signed)
 Pts sister states has fluid on leg and in pain, States she's scared he has a blood clot and wants something done about it, states the Hospital told her to call us  and we are telling her to call the cardiologist, states she is getting the run around and its Kind of Ridiculous

## 2024-02-26 ENCOUNTER — Emergency Department: Payer: Self-pay

## 2024-02-26 ENCOUNTER — Emergency Department (HOSPITAL_COMMUNITY)

## 2024-02-26 ENCOUNTER — Encounter (HOSPITAL_COMMUNITY): Payer: Self-pay

## 2024-02-26 ENCOUNTER — Other Ambulatory Visit: Payer: Self-pay

## 2024-02-26 ENCOUNTER — Emergency Department (HOSPITAL_COMMUNITY)
Admission: EM | Admit: 2024-02-26 | Discharge: 2024-02-26 | Disposition: A | Discharge status: Home / Self Care | Attending: Emergency Medicine | Admitting: Emergency Medicine

## 2024-02-26 DIAGNOSIS — Z87891 Personal history of nicotine dependence: Secondary | ICD-10-CM | POA: Diagnosis not present

## 2024-02-26 DIAGNOSIS — M79671 Pain in right foot: Secondary | ICD-10-CM | POA: Insufficient documentation

## 2024-02-26 DIAGNOSIS — I2510 Atherosclerotic heart disease of native coronary artery without angina pectoris: Secondary | ICD-10-CM | POA: Diagnosis not present

## 2024-02-26 DIAGNOSIS — Z8673 Personal history of transient ischemic attack (TIA), and cerebral infarction without residual deficits: Secondary | ICD-10-CM | POA: Insufficient documentation

## 2024-02-26 DIAGNOSIS — Z79899 Other long term (current) drug therapy: Secondary | ICD-10-CM | POA: Diagnosis not present

## 2024-02-26 DIAGNOSIS — N189 Chronic kidney disease, unspecified: Secondary | ICD-10-CM | POA: Insufficient documentation

## 2024-02-26 DIAGNOSIS — I739 Peripheral vascular disease, unspecified: Secondary | ICD-10-CM

## 2024-02-26 DIAGNOSIS — I129 Hypertensive chronic kidney disease with stage 1 through stage 4 chronic kidney disease, or unspecified chronic kidney disease: Secondary | ICD-10-CM | POA: Diagnosis not present

## 2024-02-26 DIAGNOSIS — E1122 Type 2 diabetes mellitus with diabetic chronic kidney disease: Secondary | ICD-10-CM | POA: Insufficient documentation

## 2024-02-26 DIAGNOSIS — Z951 Presence of aortocoronary bypass graft: Secondary | ICD-10-CM | POA: Insufficient documentation

## 2024-02-26 LAB — BASIC METABOLIC PANEL WITH GFR
Anion gap: 8 (ref 5–15)
BUN: 17 mg/dL (ref 6–20)
CO2: 25 mmol/L (ref 22–32)
Calcium: 8.9 mg/dL (ref 8.9–10.3)
Chloride: 104 mmol/L (ref 98–111)
Creatinine, Ser: 1.66 mg/dL — ABNORMAL HIGH (ref 0.61–1.24)
GFR, Estimated: 47 mL/min — ABNORMAL LOW (ref >60)
Glucose, Bld: 86 mg/dL (ref 70–99)
Potassium: 3.7 mmol/L (ref 3.5–5.1)
Sodium: 137 mmol/L (ref 135–145)

## 2024-02-26 LAB — CBC WITH DIFFERENTIAL/PLATELET
Abs Immature Granulocytes: 0 K/uL (ref 0.00–0.07)
Basophils Absolute: 0 K/uL (ref 0.0–0.1)
Basophils Relative: 0 %
Eosinophils Absolute: 0.1 K/uL (ref 0.0–0.5)
Eosinophils Relative: 1 %
HCT: 31.7 % — ABNORMAL LOW (ref 39.0–52.0)
Hemoglobin: 10.5 g/dL — ABNORMAL LOW (ref 13.0–17.0)
Immature Granulocytes: 0 %
Lymphocytes Relative: 59 %
Lymphs Abs: 2.6 K/uL (ref 0.7–4.0)
MCH: 30.5 pg (ref 26.0–34.0)
MCHC: 33.1 g/dL (ref 30.0–36.0)
MCV: 92.2 fL (ref 80.0–100.0)
Monocytes Absolute: 0.5 K/uL (ref 0.1–1.0)
Monocytes Relative: 12 %
Neutro Abs: 1.2 K/uL — ABNORMAL LOW (ref 1.7–7.7)
Neutrophils Relative %: 28 %
Platelets: 215 K/uL (ref 150–400)
RBC: 3.44 MIL/uL — ABNORMAL LOW (ref 4.22–5.81)
RDW: 14.6 % (ref 11.5–15.5)
WBC: 4.4 K/uL (ref 4.0–10.5)
nRBC: 0 % (ref 0.0–0.2)

## 2024-02-26 MED ORDER — GABAPENTIN 100 MG PO CAPS: 100.0000 mg | ORAL_CAPSULE | Freq: Three times a day (TID) | ORAL | 2 refills | Status: DC

## 2024-02-26 MED ORDER — SODIUM CHLORIDE 0.9 % IV BOLUS
500.0000 mL | Freq: Once | INTRAVENOUS | Status: AC
  Administered 2024-02-26: 500 mL via INTRAVENOUS

## 2024-02-26 MED ORDER — MORPHINE SULFATE (PF) 4 MG/ML IV SOLN
4.0000 mg | Freq: Once | INTRAVENOUS | Status: AC
  Administered 2024-02-26: 4 mg via INTRAVENOUS
  Filled 2024-02-26: qty 1

## 2024-02-26 MED ORDER — IOHEXOL 350 MG/ML SOLN
100.0000 mL | Freq: Once | INTRAVENOUS | Status: AC | PRN
  Administered 2024-02-26: 100 mL via INTRAVENOUS

## 2024-02-26 MED ORDER — ONDANSETRON HCL 4 MG/2ML IJ SOLN
4.0000 mg | Freq: Once | INTRAMUSCULAR | Status: AC
  Administered 2024-02-26: 4 mg via INTRAVENOUS
  Filled 2024-02-26: qty 2

## 2024-02-26 MED ORDER — HYDROCODONE-ACETAMINOPHEN 5-325 MG PO TABS
2.0000 | ORAL_TABLET | Freq: Four times a day (QID) | ORAL | 0 refills | Status: DC | PRN
Start: 2024-02-26 — End: 2024-02-26

## 2024-02-26 NOTE — ED Provider Notes (Addendum)
 Care of patient assumed from Dr. Elnor.  Patient presenting with 1 week of right foot pain.  Had recent proximal right SFA stent placed.  Awaiting CTA. Physical Exam  BP 120/80   Pulse 60   Temp 97.7 F (36.5 C) (Oral)   Resp 14   Ht 5' 7 (1.702 m)   Wt 62.6 kg   SpO2 97%   BMI 21.62 kg/m   Physical Exam Vitals and nursing note reviewed.  Constitutional:      General: He is not in acute distress.    Appearance: Normal appearance. He is well-developed. He is not ill-appearing, toxic-appearing or diaphoretic.  HENT:     Head: Normocephalic and atraumatic.     Right Ear: External ear normal.     Left Ear: External ear normal.     Nose: Nose normal.     Mouth/Throat:     Mouth: Mucous membranes are moist.  Eyes:     Extraocular Movements: Extraocular movements intact.     Conjunctiva/sclera: Conjunctivae normal.  Cardiovascular:     Rate and Rhythm: Normal rate and regular rhythm.  Pulmonary:     Effort: Pulmonary effort is normal. No respiratory distress.  Abdominal:     General: There is no distension.     Palpations: Abdomen is soft.     Tenderness: There is no abdominal tenderness.  Musculoskeletal:        General: No deformity. Normal range of motion.     Cervical back: Neck supple.  Skin:    General: Skin is warm and dry.     Coloration: Skin is not jaundiced or pale.  Neurological:     Mental Status: He is alert and oriented to person, place, and time. Mental status is at baseline.  Psychiatric:        Mood and Affect: Mood normal.        Behavior: Behavior normal.     Procedures  Procedures  ED Course / MDM    Medical Decision Making Amount and/or Complexity of Data Reviewed Labs: ordered. Radiology: ordered.  Risk Prescription drug management.   CTA showed no new findings.  On assessment, patient resting on ED stretcher.  He describes the area pain as medial right hindfoot.  He states that it has been ongoing for months.  He avoids placing weight  to his hindfoot when standing.  Feet appear to be swollen bilaterally.  He does have a history of neuropathy.  He is not on gabapentin  at this time.  He states that he has tried it in the past and it did not help.  Will prescribe again to trial.  Short course of narcotics ordered as well.  Patient states that he lives at home and has 2 caregivers that come by to help him.  He was offered ED observation for PT eval and social work consult.  He declined this stating that he would like to go home.  Patient to follow-up with his cardiologist who manages his PAD.  Patient was discharged in stable condition.  Patient's sister/POA called to inform that patient has a history of addiction and requested no narcotic pain medication.  Prescription for Norco was discontinued.       Melvenia Motto, MD 02/26/24 ALFONSO    Melvenia Motto, MD 02/26/24 [phone]

## 2024-02-26 NOTE — ED Provider Notes (Signed)
 [place] EMERGENCY DEPARTMENT AT [facility] Provider Note  CSN: [id number] Arrival date & time: 02/26/24 1218  Chief Complaint(s) Foot Pain  HPI Melvin Taylor is a 58 y.o. male with past medical history as below, significant for bipolar 1 disorder, CABG 2017, hypertension, schizophrenia, CKD who presents to the ED with complaint of right leg pain  Patient reports right leg pain that is chronic, ongoing for multiple months but seem to have worsened over the past week or 2.  He had lower extremity angiography performed on 02/14/2024 Dr. Darron.  Per report he had severe stenosis of the proximal SFA, and DES placed to the proximal right SFA; continued on Brilinta . Pt reports worsening right leg pain, worsened with ambulation or any sort of movement.  He was advised by cardiology to come to the ER 3 days ago but patient did not come to the ER because he did not have transportation.  Pain is worsened since then.    Past Medical History Past Medical History:  Diagnosis Date   Bipolar 1 disorder (HCC)    depression/anxiety   Bone spur    left heel   Cervical radiculopathy    Chronic back pain    Chronic chest wall pain    Chronic neck pain    Coronary artery disease    a. s/p CABG in 03/2016 with LIMA-LAD, SVG-D1, SVG-RCA, and Seq SVG-mid and distal OM   Diabetes mellitus    Type II   Hallucinations    long history of them   Headache(784.0)    History of gout    HTN (hypertension)    Insomnia    Neuropathy    Pain management    Polysubstance abuse (HCC)    Right leg pain    chronic   Schizophrenia (HCC)    Seizures (HCC)    last sz between July 5-9th, 2016; epilepsy   Sleep apnea    Stroke [facility])    Transfusion of blood product refused for religious reason    Patient Active Problem List   Diagnosis Date Noted   Peripheral arterial disease (HCC) 01/11/2024   Right leg pain 12/13/2023   IDA (iron  deficiency anemia) 12/04/2023   Hospital discharge follow-up  10/18/2023   Subarachnoid hemorrhage following injury, no loss of consciousness (HCC) 10/18/2023   COVID-19 08/24/2023   COVID-19 virus infection 08/23/2023   Nasal fracture 02/23/2023   Normocytic anemia 02/06/2023   Gout 02/02/2023   Right shoulder pain 02/02/2023   Nasal pain 02/02/2023   Hypotension 12/16/2022   Protein-calorie malnutrition, severe 08/30/2022   Acute CVA (cerebrovascular accident) (HCC) 08/27/2022   Subdural hematoma (HCC) 08/27/2022   Seizure disorder (HCC) 08/27/2022   OSA (obstructive sleep apnea) 08/27/2022   Acute metabolic encephalopathy 08/27/2022   Stage 3b chronic kidney disease (CKD) (HCC) 06/06/2022   Trigger finger, left middle finger    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   Malnutrition of moderate degree 12/18/2020   Middle cerebral artery embolism, 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   Renal insufficiency    Malignant hypertension    Acute ischemic left MCA stroke (HCC) 12/16/2020   Current smoker 06/21/2019   Seizure-like activity (HCC) 05/06/2019   Diabetes mellitus (HCC) 04/11/2019   Anemia, chronic disease 07/04/2018   Other specified diseases of the digestive system 10/30/2017   CAD (coronary artery disease) 03/28/2016   Chest pain 03/21/2016   Hyperlipidemia 03/11/2016   Ischemic chest pain (HCC)    NSTEMI (non-ST elevated myocardial infarction) (HCC)  Coronary atherosclerosis of native coronary artery 03/03/2016   Cervical spinal stenosis 12/14/2015   Loss of weight 08/25/2015   Primary hypertension 07/23/2015   Personal history of noncompliance with medical treatment, presenting hazards to health 07/23/2015   Abdominal pain 06/08/2015   Constipation 06/08/2015   History of colonic polyps    Diverticulosis of colon without hemorrhage    Mucosal abnormality of stomach    Encounter for screening colonoscopy 04/16/2015   Dysphagia 04/16/2015   Partial symptomatic epilepsy with complex partial seizures, intractable, without status  epilepticus (HCC) 11/10/2014   Anxiety and depression 10/15/2014   Schizophrenia (HCC) 10/15/2014   DM type 2 causing vascular disease (HCC) 06/29/2014   Musculoskeletal pain 06/29/2014   Hyperglycemia without ketosis 09/07/2013   Degeneration disease of medial meniscus 01/13/2011   Other internal derangements of unspecified knee 01/13/2011   Chronic pain of right knee 12/28/2010   Medial meniscus, posterior horn derangement 12/28/2010   Home Medication(s) Prior to Admission medications   Medication Sig Start Date End Date Taking? Authorizing Provider  ACCU-CHEK GUIDE test strip USE AS DIRECTED TO MONITOR BLOOD SUGAR (4) TIMES DAILY. 04/24/23   [name], MD  acetaminophen  (TYLENOL ) 325 MG tablet Take 2 tablets (650 mg total) by mouth every 6 (six) hours as needed for mild pain (pain score 1-3) (or Fever >/= 101). 08/26/23   Pearlean Manus, MD  allopurinol  (ZYLOPRIM ) 100 MG tablet Take 1 tablet (100 mg total) by mouth daily. 02/22/24   Bevely Doffing, FNP  amLODipine  (NORVASC ) 10 MG tablet Take 1 tablet (10 mg total) by mouth daily. 02/22/24   Bevely Doffing, FNP  atorvastatin  (LIPITOR ) 80 MG tablet Take 1 tablet (80 mg total) by mouth at bedtime. 02/22/24   Bevely Doffing, FNP  BRILINTA  90 MG TABS tablet TAKE 1 TABLET BY MOUTH TWICE DAILY 01/17/24   Bevely Doffing, FNP  Cholecalciferol (VITAMIN D3) 50 MCG (2000 UT) capsule Take 1 capsule (2,000 Units total) by mouth daily. 03/10/23   [name], MD  divalproex  (DEPAKOTE ) 250 MG DR tablet Take 1 tablet (250 mg total) by mouth 2 (two) times daily. 11/13/23   Whitfield Raisin, NP  escitalopram  (LEXAPRO ) 20 MG tablet Take 1 tablet (20 mg total) by mouth daily. 02/22/24   Bevely Doffing, FNP  ferrous sulfate  325 (65 FE) MG tablet Take 1 tablet (325 mg total) by mouth daily with breakfast. 08/26/23   Pearlean Manus, MD  isosorbide  mononitrate (IMDUR ) 30 MG 24 hr tablet Take 1 tablet (30 mg total) by mouth every morning. 02/22/24   Bevely Doffing, FNP  lamoTRIgine  (LAMICTAL ) 25 MG tablet Take 2 tablets (50 mg total) by mouth 2 (two) times daily. 11/13/23   Whitfield Raisin, NP  Multiple Vitamin (MULTIVITAMIN WITH MINERALS) TABS tablet Take 1 tablet by mouth daily. 09/14/22   Angiulli, Toribio PARAS, PA-C  UNABLE TO FIND 1 each by Does not apply route daily. Med Name: GLOVES 1B/M OR PRN 06/20/23   [name], MD  UNABLE TO FIND 1 each by Does not apply route daily. Med Name: PULL UPS 100/M OR PRN 06/20/23   [name], MD  UNABLE TO FIND 1 each by Does not apply route daily. Med Name: NUTRITIONAL SUPPLEMENT 180U/M OR PRN 06/20/23   [name], MD  UNABLE TO FIND 1 each by Does not apply route daily. Med Name: UNDER PADS/CHUX 30/M OR PRN 06/20/23   [name], MD  Past Surgical History Past Surgical History:  Procedure Laterality Date   ABDOMINAL AORTOGRAM N/A 02/14/2024   Procedure: ABDOMINAL AORTOGRAM;  Surgeon: Darron Deatrice LABOR, MD;  Location: [facility] INVASIVE CV LAB;  Service: Cardiovascular;  Laterality: N/A;   ANTERIOR CERVICAL DECOMP/DISCECTOMY FUSION N/A 12/14/2015   Procedure: ANTERIOR CERVICAL DECOMPRESSION/DISCECTOMY FUSION CERVICAL FIVE -SIX;  Surgeon: Victory Gunnels, MD;  Location: [facility] NEURO ORS;  Service: Neurosurgery;  Laterality: N/A;   BIOPSY  05/07/2015   Procedure: BIOPSY (Gastric);  Surgeon: Lamar CHRISTELLA Hollingshead, MD;  Location: [facility] ORS;  Service: Endoscopy;;   BURR HOLE FOR SUBDURAL HEMATOMA Right 10/24/2023   CARDIAC CATHETERIZATION N/A 03/07/2016   Procedure: Left Heart Cath and Coronary Angiography;  Surgeon: Victory LELON Sharps, MD;  Location: [facility] INVASIVE CV LAB;  Service: Cardiovascular;  Laterality: N/A;   COLONOSCOPY WITH PROPOFOL  N/A 05/07/2015   MFM:ejwrnonwpr diverticulosis, multiple colon polyps removed, tubular adenoma, serrated colon polyp. Next colonoscopy October 2019    COLONOSCOPY WITH PROPOFOL  N/A 08/02/2018   Procedure: COLONOSCOPY WITH PROPOFOL ;  Surgeon: Hollingshead Lamar CHRISTELLA, MD;  Location: [facility] ENDO SUITE;  Service: Endoscopy;  Laterality: N/A;  12:00pm   CORONARY ARTERY BYPASS GRAFT N/A 03/28/2016   Procedure: CORONARY ARTERY BYPASS GRAFTING (CABG) x 5 USING GREATER SAPHENOUS VEIN;  Surgeon: Dorise MARLA Fellers, MD;  Location: [facility] OR;  Service: Open Heart Surgery;  Laterality: N/A;   ENDOVEIN HARVEST OF GREATER SAPHENOUS VEIN Right 03/28/2016   Procedure: ENDOVEIN HARVEST OF GREATER SAPHENOUS VEIN;  Surgeon: Dorise MARLA Fellers, MD;  Location: [facility] OR;  Service: Open Heart Surgery;  Laterality: Right;   ESOPHAGEAL DILATION N/A 05/07/2015   Procedure: ESOPHAGEAL DILATION WITH 56FR MALONEY DILATOR;  Surgeon: Lamar CHRISTELLA Hollingshead, MD;  Location: [facility] ORS;  Service: Endoscopy;  Laterality: N/A;   ESOPHAGOGASTRODUODENOSCOPY (EGD) WITH PROPOFOL  N/A 05/07/2015   RMR: Status post dilation of normal esophagus. Gastritis.   IR CT HEAD LTD  12/17/2020   IR CT HEAD LTD  12/17/2020   IR INTRA CRAN STENT  12/17/2020   IR PERCUTANEOUS ART THROMBECTOMY/INFUSION INTRACRANIAL INC DIAG ANGIO  12/17/2020   IR RADIOLOGIST EVAL & MGMT  03/25/2021   KNEE SURGERY Left    arthroscopy   LOWER EXTREMITY ANGIOGRAPHY Right 02/14/2024   Procedure: Lower Extremity Angiography;  Surgeon: Darron Deatrice LABOR, MD;  Location: [facility] INVASIVE CV LAB;  Service: Cardiovascular;  Laterality: Right;   LOWER EXTREMITY INTERVENTION Right 02/14/2024   Procedure: LOWER EXTREMITY INTERVENTION;  Surgeon: Darron Deatrice LABOR, MD;  Location: [facility] INVASIVE CV LAB;  Service: Cardiovascular;  Laterality: Right;   MANDIBLE FRACTURE SURGERY     POLYPECTOMY  05/07/2015   Procedure: POLYPECTOMY (Hepatic Flexure, Distal Transverse Colon, Rectal);  Surgeon: Lamar CHRISTELLA Hollingshead, MD;  Location: [facility] ORS;  Service: Endoscopy;;   RADIOLOGY WITH ANESTHESIA N/A 12/16/2020   Procedure: IR WITH ANESTHESIA;  Surgeon: Dolphus Carrion, MD;  Location: [facility] OR;   Service: Radiology;  Laterality: N/A;   TEE WITHOUT CARDIOVERSION N/A 03/28/2016   Procedure: TRANSESOPHAGEAL ECHOCARDIOGRAM (TEE);  Surgeon: Dorise MARLA Fellers, MD;  [facility];  Service: Open Heart Surgery;  Laterality: N/A;   TONSILLECTOMY     TRIGGER FINGER RELEASE Left 10/15/2021   Procedure: RELEASE TRIGGER FINGER/A-1 PULLEY left middle or long finger;  Surgeon: Margrette Taft BRAVO, MD;  Location: [facility] ORS;  Service: Orthopedics;  Laterality: Left;   Family History Family History  Problem Relation Age of Onset   Diabetes Father    Hypertension Father    Sleep apnea Father  Stroke Sister    Arthritis Other    Diabetes Other    Asthma Other     Social History Social History   Tobacco Use   Smoking status: Former    Current packs/day: 0.50    Average packs/day: 0.5 packs/day for 30.0 years (15.0 ttl pk-yrs)    Types: Cigarettes   Smokeless tobacco: Never   Tobacco comments:    3 cigs per day  Vaping Use   Vaping status: Never Used  Substance Use Topics   Alcohol use: Not Currently   Drug use: Not Currently    Types: Marijuana   Allergies Aspirin   Review of Systems A thorough review of systems was obtained and all systems are negative except as noted in the HPI and PMH.   Physical Exam Vital Signs  I have reviewed the triage vital signs BP 120/80   Pulse 60   Temp 97.7 F (36.5 C) (Oral)   Resp 14   Ht 5' 7 (1.702 m)   Wt 62.6 kg   SpO2 97%   BMI 21.62 kg/m  Physical Exam Vitals and nursing note reviewed.  Constitutional:      General: He is not in acute distress.    Appearance: Normal appearance. He is well-developed. He is not ill-appearing.  HENT:     Head: Normocephalic and atraumatic.     Right Ear: External ear normal.     Left Ear: External ear normal.     Nose: Nose normal.     Mouth/Throat:     Mouth: Mucous membranes are moist.  Eyes:     General: No scleral icterus.       Right eye: No discharge.        Left eye: No discharge.   Cardiovascular:     Rate and Rhythm: Normal rate.  Pulmonary:     Effort: Pulmonary effort is normal. No respiratory distress.     Breath sounds: No stridor.  Abdominal:     General: Abdomen is flat. There is no distension.     Tenderness: There is no guarding.  Musculoskeletal:        General: No deformity.     Cervical back: No rigidity.       Legs:     Comments: Multiple locations of discomfort  Feet:     Comments: DP pulses are intact bilateral Feet are cool symmetric   Skin:    General: Skin is warm and dry.     Coloration: Skin is not cyanotic, jaundiced or pale.  Neurological:     Mental Status: He is alert and oriented to person, place, and time.     GCS: GCS eye subscore is 4. GCS verbal subscore is 5. GCS motor subscore is 6.  Psychiatric:        Speech: Speech normal.        Behavior: Behavior normal. Behavior is cooperative.     ED Results and Treatments Labs (all labs ordered are listed, but only abnormal results are displayed) Labs Reviewed  CBC WITH DIFFERENTIAL/PLATELET - Abnormal; Notable for the following components:      Result Value   RBC 3.44 (*)    Hemoglobin 10.5 (*)    HCT 31.7 (*)    Neutro Abs 1.2 (*)    All other components within normal limits  BASIC METABOLIC PANEL WITH GFR - Abnormal; Notable for the following components:   Creatinine, Ser 1.66 (*)    GFR, Estimated 47 (*)    All other  components within normal limits                                                                                                                          Radiology No results found.  Pertinent labs & imaging results that were available during my care of the patient were reviewed by me and considered in my medical decision making (see MDM for details).  Medications Ordered in ED Medications  morphine  (PF) 4 MG/ML injection 4 mg (4 mg Intravenous Given 02/26/24 1316)  ondansetron  (ZOFRAN ) injection 4 mg (4 mg Intravenous Given 02/26/24 1316)  sodium  chloride 0.9 % bolus 500 mL (500 mLs Intravenous New Bag/Given 02/26/24 1315)                                                                                                                                     Procedures Procedures  (including critical care time)  Medical Decision Making / ED Course    Medical Decision Making:    HAYDIN CALANDRA is a 58 y.o. male with past medical history as below, significant for PAD, bipolar 1 disorder, CABG 2017, hypertension, schizophrenia, CKD who presents to the ED with complaint of right leg pain. The complaint involves an extensive differential diagnosis and also carries with it a high risk of complications and morbidity.  Serious etiology was considered. Ddx includes but is not limited to: MSK, PAD, PVD, ischemia, etc.  Complete initial physical exam performed, notably the patient was in no acute distress, HDS.    Reviewed and confirmed nursing documentation for past medical history, family history, social history.  Vital signs reviewed.    RLE pain Claudication > - History of PAD, recent peripheral angiography with proximal right SFA DES placement -Worsening leg pain, specifically with any sort of movement - Palpable DP pulses - CTA runoff pending - pain improved, labs stable - spoke w/ Dr Stacia, recommend call back if CTA abnormal  Handoff incoming EDP pending CTA                     Additional history obtained: -Additional history obtained from na -External records from outside source obtained and reviewed including: Chart review including previous notes, labs, imaging, consultation notes including  Cardiology documentation Prior angio report    Lab Tests: -I ordered, reviewed, and interpreted labs.   The pertinent results include:  Labs Reviewed  CBC WITH DIFFERENTIAL/PLATELET - Abnormal; Notable for the following components:      Result Value   RBC 3.44 (*)    Hemoglobin 10.5 (*)    HCT 31.7 (*)     Neutro Abs 1.2 (*)    All other components within normal limits  BASIC METABOLIC PANEL WITH GFR    Notable for labs stable  EKG   EKG Interpretation Date/Time:    Ventricular Rate:    PR Interval:    QRS Duration:    QT Interval:    QTC Calculation:   R Axis:      Text Interpretation:           Imaging Studies ordered: I ordered imaging studies including cta runoff > pending formal read I independently visualized the following imaging with scope of interpretation limited to determining acute life threatening conditions related to emergency care; appears to have patent flow length of RLE If any imaging was obtained with contrast I closely monitored patient for any possible adverse reaction a/w contrast administration in the emergency department   Medicines ordered and prescription drug management: Meds ordered this encounter  Medications   morphine  (PF) 4 MG/ML injection 4 mg   ondansetron  (ZOFRAN ) injection 4 mg   sodium chloride  0.9 % bolus 500 mL    -I have reviewed the patients home medicines and have made adjustments as needed   Consultations Obtained: I requested consultation with the cardiology,  and discussed lab and imaging findings as well as pertinent plan    Cardiac Monitoring: Continuous pulse oximetry interpreted by myself, 98% on RA.    Social Determinants of Health:  Diagnosis or treatment significantly limited by social determinants of health: former smoker   Reevaluation: After the interventions noted above, I reevaluated the patient and found that they have improved  Co morbidities that complicate the patient evaluation  Past Medical History:  Diagnosis Date   Bipolar 1 disorder (HCC)    depression/anxiety   Bone spur    left heel   Cervical radiculopathy    Chronic back pain    Chronic chest wall pain    Chronic neck pain    Coronary artery disease    a. s/p CABG in 03/2016 with LIMA-LAD, SVG-D1, SVG-RCA, and Seq SVG-mid and distal  OM   Diabetes mellitus    Type II   Hallucinations    long history of them   Headache(784.0)    History of gout    HTN (hypertension)    Insomnia    Neuropathy    Pain management    Polysubstance abuse (HCC)    Right leg pain    chronic   Schizophrenia (HCC)    Seizures (HCC)    last sz between July 5-9th, 2016; epilepsy   Sleep apnea    Stroke (HCC)    Transfusion of blood product refused for religious reason       Dispostion: Disposition decision including need for hospitalization was considered, and patient disposition pending at time of sign out.    Final Clinical Impression(s) / ED Diagnoses Final diagnoses:  None        Elnor Jayson LABOR, DO 02/26/24 1552

## 2024-02-26 NOTE — Telephone Encounter (Signed)
 Called to follow up with the patient regarding the message and recommendations provided last week. The patient reported continued swelling and 10/10 pain in his right leg. His sister was also present on the call and stated they had attempted to apply a compression stocking, but the swelling did not improve.  The nurse informed the patient and his sister of Dr. Ulysses recommendation to report to the emergency room for further evaluation. Both the patient and his sister verbalized understanding. The sister stated she would arrange transportation to take the patient to [facility] ER.

## 2024-02-26 NOTE — Discharge Instructions (Addendum)
 Your CTA did not show any new abnormal findings.  Your recent stent is patent.  Follow-up with Dr. Darron to discuss your ongoing pain.  In the meantime, a prescription was sent to your pharmacy: -Gabapentin  treats neuropathic pain.  Take as prescribed.  You were started on a low dose.  You can increase this dose over time for better effect.  Talk to your primary care doctor about this.

## 2024-02-26 NOTE — ED Triage Notes (Signed)
 Pt BIB [facility] for R ankle and foot pain. Pt has a hx of gout and had a stent placed through L groin 1 week ago. Pt denies any injuries to R foot/ankle.

## 2024-02-29 ENCOUNTER — Encounter: Payer: Self-pay | Admitting: Podiatry

## 2024-02-29 ENCOUNTER — Ambulatory Visit (INDEPENDENT_AMBULATORY_CARE_PROVIDER_SITE_OTHER): Admitting: Podiatry

## 2024-02-29 DIAGNOSIS — M79674 Pain in right toe(s): Secondary | ICD-10-CM

## 2024-02-29 DIAGNOSIS — E1142 Type 2 diabetes mellitus with diabetic polyneuropathy: Secondary | ICD-10-CM

## 2024-02-29 DIAGNOSIS — B351 Tinea unguium: Secondary | ICD-10-CM

## 2024-02-29 DIAGNOSIS — M79675 Pain in left toe(s): Secondary | ICD-10-CM | POA: Diagnosis not present

## 2024-02-29 NOTE — Progress Notes (Signed)
 This patient returns to my office for at risk foot care.  This patient requires this care by a professional since this patient will be at risk due to having diabetic neuropathy.  This patient is unable to cut nails himself since the patient cannot reach his nails.These nails are painful walking and wearing shoes.  Vascular surgery  previously performed. This patient presents for at risk foot care today.  General Appearance  Alert, conversant and in no acute stress.  Vascular  Dorsalis pedis and posterior tibial  pulses are palpable  bilaterally.  Capillary return is within normal limits  bilaterally. Temperature is within normal limits  bilaterally.  Neurologic  Senn-Weinstein monofilament wire test within normal limits  bilaterally. Muscle power within normal limits bilaterally.  Nails Thick disfigured discolored nails with subungual debris  from hallux to fifth toes bilaterally. No evidence of bacterial infection or drainage bilaterally.  Orthopedic  No limitations of motion  feet .  No crepitus or effusions noted.  No bony pathology or digital deformities noted.  Skin  normotropic skin with no porokeratosis noted bilaterally.  No signs of infections or ulcers noted.     Onychomycosis  Pain in right toes  Pain in left toes  Consent was obtained for treatment procedures.   Mechanical debridement of nails 1-5  bilaterally performed with a nail nipper.  Filed with dremel without incident.    Return office visit   4   months                   Told patient to return for periodic foot care and evaluation due to potential at risk complications.   Cordella Bold DPM

## 2024-03-05 ENCOUNTER — Telehealth: Payer: Self-pay

## 2024-03-05 NOTE — Telephone Encounter (Signed)
 Copied from CRM #[id number]. Topic: General - Other >> Mar 05, 2024  9:50 AM Emylou G wrote: Reason for CRM: Annabella Gordon [phone].SABRA Pls call her back - was disconnected.. wasn't aware of PCP change.. wants to know where Dr Melvenia is going and or who will the patient be assigned to

## 2024-03-05 NOTE — Telephone Encounter (Signed)
 Spoke with pt sister and gave clarification

## 2024-03-07 ENCOUNTER — Inpatient Hospital Stay

## 2024-03-07 ENCOUNTER — Inpatient Hospital Stay: Attending: Hematology

## 2024-03-07 VITALS — BP 120/81 | HR 87 | Temp 96.4°F | Resp 18

## 2024-03-07 DIAGNOSIS — N1832 Chronic kidney disease, stage 3b: Secondary | ICD-10-CM | POA: Insufficient documentation

## 2024-03-07 DIAGNOSIS — D631 Anemia in chronic kidney disease: Secondary | ICD-10-CM | POA: Diagnosis present

## 2024-03-07 DIAGNOSIS — D638 Anemia in other chronic diseases classified elsewhere: Secondary | ICD-10-CM

## 2024-03-07 DIAGNOSIS — D649 Anemia, unspecified: Secondary | ICD-10-CM

## 2024-03-07 LAB — CBC
HCT: 30.5 % — ABNORMAL LOW (ref 39.0–52.0)
Hemoglobin: 10.2 g/dL — ABNORMAL LOW (ref 13.0–17.0)
MCH: 30.9 pg (ref 26.0–34.0)
MCHC: 33.4 g/dL (ref 30.0–36.0)
MCV: 92.4 fL (ref 80.0–100.0)
Platelets: 175 K/uL (ref 150–400)
RBC: 3.3 MIL/uL — ABNORMAL LOW (ref 4.22–5.81)
RDW: 15.3 % (ref 11.5–15.5)
WBC: 4.2 K/uL (ref 4.0–10.5)
nRBC: 0 % (ref 0.0–0.2)

## 2024-03-07 LAB — IRON AND TIBC
Iron: 110 ug/dL (ref 45–182)
Saturation Ratios: 49 % — ABNORMAL HIGH (ref 17.9–39.5)
TIBC: 225 ug/dL — ABNORMAL LOW (ref 250–450)
UIBC: 115 ug/dL

## 2024-03-07 LAB — FERRITIN: Ferritin: 419 ng/mL — ABNORMAL HIGH (ref 24–336)

## 2024-03-07 MED ORDER — EPOETIN ALFA-EPBX 2000 UNIT/ML IJ SOLN
2000.0000 [IU] | Freq: Once | INTRAMUSCULAR | Status: AC
  Administered 2024-03-07: 2000 [IU] via SUBCUTANEOUS
  Filled 2024-03-07: qty 1

## 2024-03-07 MED ORDER — EPOETIN ALFA-EPBX 10000 UNIT/ML IJ SOLN: 5000.0000 [IU] | Freq: Once | INTRAMUSCULAR | Status: DC

## 2024-03-07 MED ORDER — EPOETIN ALFA-EPBX 3000 UNIT/ML IJ SOLN
3000.0000 [IU] | Freq: Once | INTRAMUSCULAR | Status: AC
  Administered 2024-03-07: 3000 [IU] via SUBCUTANEOUS
  Filled 2024-03-07: qty 1

## 2024-03-07 NOTE — Patient Instructions (Signed)

## 2024-03-07 NOTE — Progress Notes (Signed)
 Patient's Hgb 10.2 and blood pressure stable. Patient tolerated injection with no complaints voiced.  Site clean and dry with no bruising or swelling noted at site.  See MAR for details.  Band aid applied.  Patient stable during and after injection.  Vss with discharge and left in satisfactory condition with no s/s of distress noted. All follow ups as scheduled.   Melvin Taylor

## 2024-03-12 ENCOUNTER — Ambulatory Visit (HOSPITAL_COMMUNITY)

## 2024-03-12 ENCOUNTER — Ambulatory Visit (HOSPITAL_COMMUNITY): Admission: RE | Admit: 2024-03-12 | Source: Ambulatory Visit

## 2024-03-13 ENCOUNTER — Encounter: Payer: Self-pay | Admitting: Physician Assistant

## 2024-03-13 ENCOUNTER — Ambulatory Visit: Attending: Cardiovascular Disease | Admitting: Physician Assistant

## 2024-03-13 VITALS — BP 102/68 | HR 65 | Ht 67.0 in | Wt 131.8 lb

## 2024-03-13 DIAGNOSIS — Z794 Long term (current) use of insulin: Secondary | ICD-10-CM | POA: Diagnosis present

## 2024-03-13 DIAGNOSIS — I10 Essential (primary) hypertension: Secondary | ICD-10-CM | POA: Diagnosis present

## 2024-03-13 DIAGNOSIS — E119 Type 2 diabetes mellitus without complications: Secondary | ICD-10-CM | POA: Diagnosis present

## 2024-03-13 DIAGNOSIS — Z8673 Personal history of transient ischemic attack (TIA), and cerebral infarction without residual deficits: Secondary | ICD-10-CM | POA: Diagnosis present

## 2024-03-13 DIAGNOSIS — I25810 Atherosclerosis of coronary artery bypass graft(s) without angina pectoris: Secondary | ICD-10-CM | POA: Diagnosis present

## 2024-03-13 DIAGNOSIS — E785 Hyperlipidemia, unspecified: Secondary | ICD-10-CM | POA: Insufficient documentation

## 2024-03-13 DIAGNOSIS — I739 Peripheral vascular disease, unspecified: Secondary | ICD-10-CM | POA: Insufficient documentation

## 2024-03-13 NOTE — Patient Instructions (Signed)
 Medication Instructions:  Your physician recommends that you continue on your current medications as directed. Please refer to the Current Medication list given to you today.  *If you need a refill on your cardiac medications before your next appointment, please call your pharmacy*  Lab Work: None ordered If you have labs (blood work) drawn today and your tests are completely normal, you will receive your results only by: MyChart Message (if you have MyChart) OR A paper copy in the mail If you have any lab test that is abnormal or we need to change your treatment, we will call you to review the results.  Follow-Up: At [facility], you and your health needs are our priority.  As part of our continuing mission to provide you with exceptional heart care, our providers are all part of one team.  This team includes your primary Cardiologist (physician) and Advanced Practice Providers or APPs (Physician Assistants and Nurse Practitioners) who all work together to provide you with the care you need, when you need it.  Your next appointment:   6 month(s) or sooner if needed based on testing  Provider:   Dr Darron

## 2024-03-13 NOTE — Progress Notes (Signed)
 Cardiology Office Note   Date:  03/13/2024  ID:  Melvin Taylor, DOB 1965/09/01, MRN [id number] PCP: Bevely Doffing, FNP  [facility] Providers Cardiologist:  Maude Emmer, MD PV Cardiologist:  Deatrice Cage, MD     History of Present Illness Melvin Taylor is a 58 y.o. male with past medical history of CAD s/p CABG 2017 (LIMA-LAD, SVG-D1, SVG-RCA and sequential SVG-mid and distal OM), PAD, CKD, hypertension, hyperlipidemia, IDDM, history of CVA, OSA, bipolar disorder, schizophrenia and tobacco use.  Echocardiogram in May 2022 showed EF 65 to 70%.  Stress test performed in February 2022 was normal without ischemia, overall low risk study.  He was admitted in May 2022 with right-sided weakness and speech difficulty.  MRI showed scattered infarct involving the left MCA affecting the frontal, parietal and occipital lobe.  He underwent mechanical thrombectomy.  EEG negative for seizure.  He was allergic to aspirin  and therefore discharged on Brilinta .  Echocardiogram obtained on 08/20/2022 showed EF 60 to 65%, grade 1 DD, normal RV, mild MR. He had a subdural hematoma as result of a fall in 2024.  Mobility has been limited due to history of stroke and he uses wheelchair occasionally.  He was referred to Dr. Liberty on 01/30/2024 for evaluation of abnormal ABI.  Right ABI was 0.66, left ABI 0.97.  Patient had severe claudication and the rest pain on the right side.  He underwent lower extremity angiography by Dr. Cloria on 02/14/2024 that showed no significant aortoiliac disease, severe discrete stenosis in the proximal right SFA, mild popliteal and a three-vessel runoff below the knee.  He underwent successful drug-eluting stent placement to the proximal right SFA lesion.  He was not placed on aspirin  afterward due to history of allergy.  He was left on Brilinta .  Since the procedure, he did return back to the ED on 02/26/2024 complaining of worsening right leg pain.  Creatinine at the time was 1.66.   Hemoglobin 10.5.  CTA of aortobifemoral showed scattered diffuse partially calcified atherosclerotic plaque, no significant stenosis in the aorta or iliac vasculature, there appears to be intact patent right proximal SFA stent.  There were three-vessel runoff noted to the ankle bilaterally.  Moderate disease noted in the proximal trifurcation vessel, left greater than right.  Patient presents today for follow-up.  He still have pain in bilateral lower extremity below the knee, right worse than left.  While he was trying to take off his socks to feel the pulse in his foot, he grimaced from the pain.  He says the pain is worse with walking, however the pain seems also to be worsening when he put the weight on his leg.  Certain areas of the foot is tender to touch.  He has trace edema bilaterally, worse on the right side, I suspect this is more related to reperfusion.  He has 2+ pulse in bilateral dorsalis pedis artery, weak pulse in bilateral posterior tibial artery.  I suspect his leg pain is more related to neuropathy at this point.  He has upcoming ABI and lower extremity arterial Doppler in 2 days as he missed the previous appointment to get them due to transportation issues.  If both studies are stable, he can follow-up with Dr. Cage in 6 months.   ROS:   He has bilateral lower extremity pain, he denies any chest pain, shortness of breath.  He has lower extremity edema.  Studies Reviewed      Cardiac Studies & Procedures   ______________________________________________________________________________________________  STRESS TESTS  NM MYOCAR MULTI W/SPECT W 09/21/2020  Narrative  The study is normal.  This is a low risk study.  The left ventricular ejection fraction is normal (55-65%).  Normal resting and stress perfusion. No ischemia or infarction EF 56%   ECHOCARDIOGRAM  ECHOCARDIOGRAM COMPLETE 08/28/2022  Narrative ECHOCARDIOGRAM REPORT    Patient Name:   Melvin Taylor Date of Exam: 08/28/2022 Medical Rec #:  [id number]         Height:       65.0 in Accession #:    [id number]        Weight:       116.6 lb Date of Birth:  [birth year]          BSA:          1.573 m Patient Age:    56 years          BP:           130/84 mmHg Patient Gender: M                 HR:           56 bpm. Exam Location:  Inpatient  Procedure: 2D Echo, Cardiac Doppler and Color Doppler  Indications:    I63.9 Stroke  History:        Patient has prior history of Echocardiogram examinations, most recent 12/17/2020. Stroke; Risk Factors:Sleep Apnea, Diabetes, Dyslipidemia and Former Smoker.  Sonographer:    Tillman Nora RVT RCS Referring Phys: [id number] JENNIFER YATES   Sonographer Comments: Image acquisition challenging due to uncooperative patient and Patient unable to turn; images off axis; patient kept pushing Techs hand away, exam ended. IMPRESSIONS   1. Left ventricular ejection fraction, by estimation, is 60 to 65%. The left ventricle has normal function. The left ventricle has no regional wall motion abnormalities. There is mild left ventricular hypertrophy. Left ventricular diastolic parameters are consistent with Grade I diastolic dysfunction (impaired relaxation). 2. Right ventricular systolic function is normal. The right ventricular size is normal. 3. The mitral valve is normal in structure. Mild mitral valve regurgitation. No evidence of mitral stenosis. 4. The aortic valve is tricuspid. Aortic valve regurgitation is not visualized. Aortic valve sclerosis is present, with no evidence of aortic valve stenosis. 5. The inferior vena cava is normal in size with greater than 50% respiratory variability, suggesting right atrial pressure of 3 mmHg.  Comparison(s): EF 65-70%.  FINDINGS Left Ventricle: Left ventricular ejection fraction, by estimation, is 60 to 65%. The left ventricle has normal function. The left ventricle has no regional wall motion abnormalities. The left  ventricular internal cavity size was normal in size. There is mild left ventricular hypertrophy. Left ventricular diastolic parameters are consistent with Grade I diastolic dysfunction (impaired relaxation).  Right Ventricle: The right ventricular size is normal. Right ventricular systolic function is normal.  Left Atrium: Left atrial size was normal in size.  Right Atrium: Right atrial size was normal in size.  Pericardium: There is no evidence of pericardial effusion.  Mitral Valve: The mitral valve is normal in structure. Mild mitral valve regurgitation. No evidence of mitral valve stenosis.  Tricuspid Valve: The tricuspid valve is normal in structure. Tricuspid valve regurgitation is mild . No evidence of tricuspid stenosis.  Aortic Valve: The aortic valve is tricuspid. Aortic valve regurgitation is not visualized. Aortic valve sclerosis is present, with no evidence of aortic valve stenosis. Aortic valve mean gradient measures 1.0 mmHg. Aortic valve peak gradient measures  2.6 mmHg. Aortic valve area, by VTI measures 4.70 cm.  Pulmonic Valve: The pulmonic valve was normal in structure. Pulmonic valve regurgitation is trivial. No evidence of pulmonic stenosis.  Aorta: The aortic root is normal in size and structure.  Venous: The inferior vena cava is normal in size with greater than 50% respiratory variability, suggesting right atrial pressure of 3 mmHg.  IAS/Shunts: The interatrial septum is aneurysmal. No atrial level shunt detected by color flow Doppler.   LEFT VENTRICLE PLAX 2D LVIDd:         3.70 cm   Diastology LVIDs:         2.80 cm   LV e' medial:   6.76 cm/s LV PW:         1.35 cm   LV E/e' medial: 9.0 LV IVS:        1.15 cm LVOT diam:     2.30 cm LV SV:         64 LV SV Index:   41 LVOT Area:     4.15 cm   RIGHT VENTRICLE RV S prime:     9.41 cm/s  LEFT ATRIUM         Index LA diam:    2.90 cm 1.84 cm/m AORTIC VALVE                    PULMONIC VALVE AV Area  (Vmax):    4.08 cm     PV Vmax:          0.75 m/s AV Area (Vmean):   3.41 cm     PV Peak grad:     2.2 mmHg AV Area (VTI):     4.70 cm     PR End Diast Vel: 4.33 msec AV Vmax:           80.50 cm/s AV Vmean:          55.100 cm/s AV VTI:            0.137 m AV Peak Grad:      2.6 mmHg AV Mean Grad:      1.0 mmHg LVOT Vmax:         79.00 cm/s LVOT Vmean:        45.200 cm/s LVOT VTI:          0.155 m LVOT/AV VTI ratio: 1.13  AORTA Ao Root diam: 3.70 cm  MITRAL VALVE               TRICUSPID VALVE MV Area (PHT): 2.26 cm    TR Peak grad:   26.8 mmHg MV Decel Time: 336 msec    TR Vmax:        259.00 cm/s MV E velocity: 60.60 cm/s MV A velocity: 87.30 cm/s  SHUNTS MV E/A ratio:  0.69        Systemic VTI:  0.16 m Systemic Diam: 2.30 cm  Redell Shallow MD Electronically signed by Redell Shallow MD Signature Date/Time: 08/28/2022/12:15:17 PM    Final          ______________________________________________________________________________________________      Risk Assessment/Calculations           Physical Exam VS:  BP 102/68   Pulse 65   Ht 5' 7 (1.702 m)   Wt 131 lb 12.8 oz (59.8 kg)   SpO2 96%   BMI 20.64 kg/m        Wt Readings from Last 3 Encounters:  03/13/24 131 lb 12.8 oz (59.8 kg)  02/26/24 138 lb 0.1 oz (62.6 kg)  02/14/24 138 lb (62.6 kg)    GEN: Well nourished, well developed in no acute distress NECK: No JVD; No carotid bruits CARDIAC: RRR, no murmurs, rubs, gallops RESPIRATORY:  Clear to auscultation without rales, wheezing or rhonchi  ABDOMEN: Soft, non-tender, non-distended EXTREMITIES:  No edema; No deformity   ASSESSMENT AND PLAN  PAD: Recently underwent drug-eluting stent to right SFA.  After the procedure, he continued to have significant leg pain and foot pain.  On exam today, he has great pulses, recent CT angiogram of the lower extremity showed good blood flow through the right SFA stent.  Suspicion for restenosis of the stent is very  low.  He has upcoming ABI and a lower extremity arterial Doppler.  I suspect his current leg pain is more from neuropathy rather than blood flow issue.  Continue Brilinta .  Not on aspirin  due to allergy  CAD: Denies any chest pain.  Not on aspirin  due to allergy  Hypertension: Blood pressure stable  Hyperlipidemia: On atorvastatin   DM2: Managed by her primary care provider  History of CVA: No recent recurrence       Dispo: Follow-up with Dr. Darron in 6 months  Signed, Mclane Arora, [place]

## 2024-03-15 ENCOUNTER — Ambulatory Visit (HOSPITAL_COMMUNITY)
Admission: RE | Admit: 2024-03-15 | Discharge: 2024-03-15 | Disposition: A | Discharge status: Home / Self Care | Source: Ambulatory Visit | Attending: Cardiovascular Disease | Admitting: Cardiovascular Disease

## 2024-03-15 ENCOUNTER — Ambulatory Visit (HOSPITAL_BASED_OUTPATIENT_CLINIC_OR_DEPARTMENT_OTHER)
Admission: RE | Admit: 2024-03-15 | Discharge: 2024-03-15 | Disposition: A | Discharge status: Home / Self Care | Source: Ambulatory Visit | Attending: Cardiovascular Disease | Admitting: Cardiovascular Disease

## 2024-03-15 DIAGNOSIS — I739 Peripheral vascular disease, unspecified: Secondary | ICD-10-CM | POA: Insufficient documentation

## 2024-03-16 LAB — VAS US ABI WITH/WO TBI
Left ABI: 0.87
Right ABI: 0.89

## 2024-03-18 ENCOUNTER — Ambulatory Visit: Payer: Self-pay | Admitting: Cardiovascular Disease

## 2024-03-18 DIAGNOSIS — I739 Peripheral vascular disease, unspecified: Secondary | ICD-10-CM

## 2024-03-21 ENCOUNTER — Other Ambulatory Visit: Payer: Self-pay

## 2024-03-21 DIAGNOSIS — N1832 Chronic kidney disease, stage 3b: Secondary | ICD-10-CM

## 2024-03-21 DIAGNOSIS — D649 Anemia, unspecified: Secondary | ICD-10-CM

## 2024-03-21 DIAGNOSIS — D638 Anemia in other chronic diseases classified elsewhere: Secondary | ICD-10-CM

## 2024-03-22 ENCOUNTER — Inpatient Hospital Stay

## 2024-03-22 ENCOUNTER — Inpatient Hospital Stay (HOSPITAL_BASED_OUTPATIENT_CLINIC_OR_DEPARTMENT_OTHER): Admitting: Oncology

## 2024-03-22 VITALS — BP 116/77 | HR 60 | Temp 97.5°F | Resp 18

## 2024-03-22 DIAGNOSIS — I739 Peripheral vascular disease, unspecified: Secondary | ICD-10-CM

## 2024-03-22 DIAGNOSIS — D638 Anemia in other chronic diseases classified elsewhere: Secondary | ICD-10-CM | POA: Diagnosis not present

## 2024-03-22 DIAGNOSIS — N1832 Chronic kidney disease, stage 3b: Secondary | ICD-10-CM | POA: Diagnosis not present

## 2024-03-22 DIAGNOSIS — D649 Anemia, unspecified: Secondary | ICD-10-CM

## 2024-03-22 LAB — IRON AND TIBC
Iron: 164 ug/dL (ref 45–182)
Saturation Ratios: 68 % — ABNORMAL HIGH (ref 17.9–39.5)
TIBC: 240 ug/dL — ABNORMAL LOW (ref 250–450)
UIBC: 76 ug/dL

## 2024-03-22 LAB — CBC WITH DIFFERENTIAL/PLATELET
Basophils Absolute: 0 K/uL (ref 0.0–0.1)
Basophils Relative: 0 %
Eosinophils Absolute: 0 K/uL (ref 0.0–0.5)
Eosinophils Relative: 0 %
HCT: 31.5 % — ABNORMAL LOW (ref 39.0–52.0)
Hemoglobin: 10.4 g/dL — ABNORMAL LOW (ref 13.0–17.0)
Lymphocytes Relative: 77 %
Lymphs Abs: 3.3 K/uL (ref 0.7–4.0)
MCH: 30.1 pg (ref 26.0–34.0)
MCHC: 33 g/dL (ref 30.0–36.0)
MCV: 91.3 fL (ref 80.0–100.0)
Monocytes Absolute: 0.2 K/uL (ref 0.1–1.0)
Monocytes Relative: 5 %
Neutro Abs: 0.8 K/uL — ABNORMAL LOW (ref 1.7–7.7)
Neutrophils Relative %: 18 %
Platelets: 162 K/uL (ref 150–400)
RBC: 3.45 MIL/uL — ABNORMAL LOW (ref 4.22–5.81)
RDW: 15.6 % — ABNORMAL HIGH (ref 11.5–15.5)
Smear Review: NORMAL
WBC: 4.3 K/uL (ref 4.0–10.5)
nRBC: 0 % (ref 0.0–0.2)

## 2024-03-22 LAB — FERRITIN: Ferritin: 445 ng/mL — ABNORMAL HIGH (ref 24–336)

## 2024-03-22 MED ORDER — EPOETIN ALFA-EPBX 3000 UNIT/ML IJ SOLN
3000.0000 [IU] | Freq: Once | INTRAMUSCULAR | Status: AC
  Administered 2024-03-22: 3000 [IU] via SUBCUTANEOUS
  Filled 2024-03-22: qty 1

## 2024-03-22 MED ORDER — EPOETIN ALFA-EPBX 2000 UNIT/ML IJ SOLN
2000.0000 [IU] | Freq: Once | INTRAMUSCULAR | Status: AC
  Administered 2024-03-22: 2000 [IU] via SUBCUTANEOUS
  Filled 2024-03-22 (×2): qty 1

## 2024-03-22 NOTE — Assessment & Plan Note (Signed)
 He is status post stent placement in his right lower extremity.  Reports significant amounts of pain every few minutes.  I do not see that he has scheduled follow-up with cardiology.  I have asked that he reach out to them given he should not be having that much pain.  He did recently have a ABI which showed significant improvement with patent right SFA stent.  It is recommended he return in 6 months.

## 2024-03-22 NOTE — Progress Notes (Signed)
 Melvin Taylor presents today for injection per the provider's orders.  Retacrit  5,000 administration without incident; injection site WNL; see MAR for injection details.  Patient tolerated procedure well and without incident.  No questions or complaints noted at this time.Patient's hemoglobin noted to be 10.4 today.  Discharged from clinic via wheelchair  in stable condition. Alert and oriented x 3. F/U with [facility] as scheduled.

## 2024-03-22 NOTE — Assessment & Plan Note (Addendum)
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 - He is receiving Epogen  5000 units every 2 weeks as needed. -He received 1 g Monoferric  on 12/27/2023.  Tolerated infusion well. -Labs from today show hemoglobin of 10.4.  Ferritin 445 and iron  panel showed saturations of 68%. - He will receive Epogen  5000 units today and continue CBC monitoring and pushing out to every 3 weeks.  If his hemoglobin starts to decline, we will switch him back to every 2 weeks. - RTC in 4 months for follow-up with repeat CBC, ferritin and iron  panel.

## 2024-03-22 NOTE — Progress Notes (Signed)
 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 - He is receiving Epogen  5000 units every 2 weeks as needed. -He received 1 g Monoferric  on 12/27/2023.  Tolerated infusion well. -Labs from today show hemoglobin of 10.4.  Ferritin 445 and iron  panel showed saturations of 68%. - He will receive Epogen  5000 units today and continue CBC monitoring and pushing out to every 3 weeks.  If his hemoglobin starts to decline, we will switch him back to every 2 weeks. - RTC in 4 months for follow-up with repeat CBC, ferritin and iron  panel. Peripheral arterial disease (HCC) He is status post stent placement in his right lower extremity.  Reports significant amounts of pain every few minutes.  I do not see that he has scheduled follow-up with cardiology.  I have asked that he reach out to them given he should not be having that much pain.  He did recently have a ABI which showed significant improvement with patent right SFA stent.  It is recommended he return in 6 months.  No orders of the defined types were placed in this encounter.   INTERVAL HISTORY: Patient returns for follow-up.  His last visit, he was seen in the emergency room on 01/15/2024 for a fall.  Reports he rolled out of bed and injured his head and neck.  He had had a recent subdural with bur hole evacuation from an injury on 09/26/2023.  He was just recently started back on Brilinta .  There was some concern for recurrence of subdural.  Imaging was unremarkable.  He met with cardiology on 01/30/2024 for PAD.  He was found to have severe claudication and rest pain on the right side.  It was recommended he have an abdominal aortogram with lower extremity angiography and possible endovascular intervention.  He had lower extremity angiography with surgical intervention on 02/14/2024.   He called  complaining of right leg swelling and pain on 02/23/2024 and was instructed to go to the emergency room.  He was seen on 02/26/2024 he was given a short course of narcotics.  Significant improvement in right ABI with patent right SFA stent. Repeat studies in 6 months.   Reports appetite is 75% energy levels are 75%.  He has 10 out of 10 right foot pain every few minutes due to his vascular disease.  He has occasional headaches.  We reviewed ferritin and iron  panel.   SUMMARY OF HEMATOLOGIC HISTORY: Oncology History   No history exists.   1.  Normocytic anemia: - Patient seen at the request of Dr. Melvenia - History of CVA, left MCA stent placement, chronically on Brilinta  - He is a [employer]. - Colonoscopy (08/02/2018): Diverticulosis in the sigmoid and descending colon.  Otherwise normal exam. - EGD (2016): Normal esophagus, s/p Maloney dilatation.  Abnormal gastric mucosa of uncertain significance, chronic active gastritis. - Epogen  started on 11/23/2021, currently receiving 5000 units every 2 weeks - Received 3 infusions of Venofer  300 mg, last one on 01/06/2023    CBC    Component Value Date/Time   WBC 4.3 03/22/2024 0901   RBC 3.45 (L) 03/22/2024 0901   HGB 10.4 (L) 03/22/2024 0901   HGB 10.0 (L) 11/14/2023 1317   HCT 31.5 (L) 03/22/2024 0901   HCT 31.0 (L) 11/14/2023 1317   PLT 162 03/22/2024 0901   PLT 180 11/14/2023 1317  MCV 91.3 03/22/2024 0901   MCV 94 11/14/2023 1317   MCH 30.1 03/22/2024 0901   MCHC 33.0 03/22/2024 0901   RDW 15.6 (H) 03/22/2024 0901   RDW 14.1 11/14/2023 1317   LYMPHSABS 3.3 03/22/2024 0901   LYMPHSABS 2.7 11/14/2023 1317   MONOABS 0.2 03/22/2024 0901   EOSABS 0.0 03/22/2024 0901   EOSABS 0.1 11/14/2023 1317   BASOSABS 0.0 03/22/2024 0901   BASOSABS 0.0 11/14/2023 1317       Latest Ref Rng & Units 02/26/2024    1:12 PM 02/14/2024   10:03 AM 11/14/2023    1:17 PM  CMP  Glucose 70 - 99 mg/dL 86  73  66   BUN 6 - 20 mg/dL 17  17  26     Creatinine 0.61 - 1.24 mg/dL 8.33  8.54  8.30   Sodium 135 - 145 mmol/L 137  138  140   Potassium 3.5 - 5.1 mmol/L 3.7  3.9  4.8   Chloride 98 - 111 mmol/L 104  105  105   CO2 22 - 32 mmol/L 25  26  25    Calcium  8.9 - 10.3 mg/dL 8.9  8.9  9.0      Lab Results  Component Value Date   FERRITIN 445 (H) 03/22/2024   VITAMINB12 560 12/14/2023    Vitals:   03/22/24 1003  BP: 116/77  Pulse: 60  Resp: 18  Temp: (!) 97.5 F (36.4 C)  SpO2: 100%    Review of System:  Review of Systems  Musculoskeletal:        Right foot pain   Neurological:  Positive for headaches.    Physical Exam: Physical Exam Constitutional:      Appearance: Normal appearance.  HENT:     Head: Normocephalic and atraumatic.  Eyes:     Pupils: Pupils are equal, round, and reactive to light.  Cardiovascular:     Rate and Rhythm: Normal rate and regular rhythm.     Heart sounds: Normal heart sounds. No murmur heard. Pulmonary:     Effort: Pulmonary effort is normal.     Breath sounds: Normal breath sounds. No wheezing.  Abdominal:     General: Bowel sounds are normal. There is no distension.     Palpations: Abdomen is soft.     Tenderness: There is no abdominal tenderness.  Musculoskeletal:        General: Normal range of motion.     Cervical back: Normal range of motion.  Skin:    General: Skin is warm and dry.     Findings: No rash.  Neurological:     Mental Status: He is alert and oriented to person, place, and time.     Gait: Gait is intact.  Psychiatric:        Mood and Affect: Mood and affect normal.        Cognition and Memory: Memory normal.        Judgment: Judgment normal.      I spent 20 minutes dedicated to the care of this patient (face-to-face and non-face-to-face) on the date of the encounter to include what is described in the assessment and plan.,  Delon Hope, NP 03/22/2024 10:10 AM

## 2024-03-22 NOTE — Addendum Note (Signed)
 Addended by: GEOFM NEST E on: 03/22/2024 10:46 AM   Modules accepted: Orders

## 2024-03-22 NOTE — Patient Instructions (Signed)
 [facility] - A DEPT OF [facility]  Discharge Instructions: Thank you for choosing [facility] to provide your oncology and hematology care.  If you have a lab appointment with the Cancer Center - please note that after April 8th, 2024, all labs will be drawn in the cancer center.  You do not have to check in or register with the main entrance as you have in the past but will complete your check-in in the cancer center.  Wear comfortable clothing and clothing appropriate for easy access to any Portacath or PICC line.   We strive to give you quality time with your provider. You may need to reschedule your appointment if you arrive late (15 or more minutes).  Arriving late affects you and other patients whose appointments are after yours.  Also, if you miss three or more appointments without notifying the office, you may be dismissed from the clinic at the provider's discretion.      For prescription refill requests, have your pharmacy contact our office and allow 72 hours for refills to be completed.    Today you received Retacrit  5,000U injection.     BELOW ARE SYMPTOMS THAT SHOULD BE REPORTED IMMEDIATELY: *FEVER GREATER THAN 100.4 F (38 C) OR HIGHER *CHILLS OR SWEATING *NAUSEA AND VOMITING THAT IS NOT CONTROLLED WITH YOUR NAUSEA MEDICATION *UNUSUAL SHORTNESS OF BREATH *UNUSUAL BRUISING OR BLEEDING *URINARY PROBLEMS (pain or burning when urinating, or frequent urination) *BOWEL PROBLEMS (unusual diarrhea, constipation, pain near the anus) TENDERNESS IN MOUTH AND THROAT WITH OR WITHOUT PRESENCE OF ULCERS (sore throat, sores in mouth, or a toothache) UNUSUAL RASH, SWELLING OR PAIN  UNUSUAL VAGINAL DISCHARGE OR ITCHING   Items with * indicate a potential emergency and should be followed up as soon as possible or go to the Emergency Department if any problems should occur.  Please show the CHEMOTHERAPY ALERT CARD or IMMUNOTHERAPY ALERT CARD at  check-in to the Emergency Department and triage nurse.  Should you have questions after your visit or need to cancel or reschedule your appointment, please contact [facility] [place] - A DEPT OF [facility] [phone]  and follow the prompts.  Office hours are 8:00 a.m. to 4:30 p.m. Monday - Friday. Please note that voicemails left after 4:00 p.m. may not be returned until the following business day.  We are closed weekends and major holidays. You have access to a nurse at all times for urgent questions. Please call the main number to the clinic [phone] and follow the prompts.  For any non-urgent questions, you may also contact your provider using MyChart. We now offer e-Visits for anyone 25 and older to request care online for non-urgent symptoms. For details visit [email or web address].   Also download the MyChart app! Go to the app store, search MyChart, open the app, select [place], and log in with your MyChart username and password.

## 2024-04-11 ENCOUNTER — Ambulatory Visit: Payer: Self-pay | Admitting: Family Medicine

## 2024-04-11 ENCOUNTER — Encounter: Payer: Self-pay | Admitting: Family Medicine

## 2024-04-11 ENCOUNTER — Other Ambulatory Visit: Payer: Self-pay

## 2024-04-11 ENCOUNTER — Ambulatory Visit (HOSPITAL_COMMUNITY)
Admission: RE | Admit: 2024-04-11 | Discharge: 2024-04-11 | Disposition: A | Discharge status: Home / Self Care | Source: Ambulatory Visit | Attending: Family Medicine | Admitting: Family Medicine

## 2024-04-11 VITALS — BP 105/63 | HR 62

## 2024-04-11 DIAGNOSIS — M79671 Pain in right foot: Secondary | ICD-10-CM | POA: Diagnosis present

## 2024-04-11 DIAGNOSIS — D638 Anemia in other chronic diseases classified elsewhere: Secondary | ICD-10-CM

## 2024-04-11 MED ORDER — PREDNISONE 10 MG (21) PO TBPK: ORAL_TABLET | ORAL | 0 refills | Status: DC

## 2024-04-11 NOTE — Progress Notes (Signed)
 Established Patient Office Visit   Subjective  Patient ID: Melvin Taylor, male    DOB: Jul 08, 1966  Age: 58 y.o. MRN: [id number]  Chief Complaint  Patient presents with   Foot Injury    Pain in right foot and leg with swelling    He  has a past medical history of Bipolar 1 disorder (HCC), Bone spur, Cervical radiculopathy, Chronic back pain, Chronic chest wall pain, Chronic neck pain, Coronary artery disease, Diabetes mellitus, Hallucinations, Headache(784.0), History of gout, HTN (hypertension), Insomnia, Neuropathy, Pain management, Polysubstance abuse (HCC), Right leg pain, Schizophrenia (HCC), Seizures (HCC), Sleep apnea, Stroke (HCC), and Transfusion of blood product refused for religious reason.  The patient presents with right foot pain of unknown injury mechanism. The pain is described as aching and burning, rated 9/10 in severity, and has been constant since onset. Associated symptoms include inability to bear weight and muscle weakness. The patient denies numbness, tingling, or foreign body sensation. Pain is aggravated by weight bearing, palpation, and movement. He has attempted non-weight bearing and rest without relief.    Review of Systems  Constitutional:  Negative for chills and fever.  Respiratory:  Negative for shortness of breath.   Cardiovascular:  Negative for chest pain.  Musculoskeletal:  Positive for joint pain and myalgias.       Right foot pain  Neurological:  Negative for tingling.      Objective:     BP 105/63   Pulse 62   SpO2 98%  BP Readings from Last 3 Encounters:  04/11/24 105/63  03/22/24 116/77  03/13/24 102/68      Physical Exam Vitals reviewed.  Constitutional:      General: He is not in acute distress.    Appearance: Normal appearance. He is not ill-appearing, toxic-appearing or diaphoretic.  HENT:     Head: Normocephalic.  Eyes:     General:        Right eye: No discharge.        Left eye: No discharge.      Conjunctiva/sclera: Conjunctivae normal.  Cardiovascular:     Rate and Rhythm: Normal rate.     Pulses: Normal pulses.     Heart sounds: Normal heart sounds.  Pulmonary:     Effort: Pulmonary effort is normal. No respiratory distress.     Breath sounds: Normal breath sounds.  Musculoskeletal:        General: Tenderness present.     Cervical back: Normal range of motion.  Skin:    Capillary Refill: Capillary refill takes less than 2 seconds.  Neurological:     Mental Status: He is alert.  Psychiatric:        Mood and Affect: Mood normal.        Behavior: Behavior normal.      No results found for any visits on 04/11/24.  The ASCVD Risk score (Arnett DK, et al., 2019) failed to calculate for the following reasons:   Risk score cannot be calculated because patient has a medical history suggesting prior/existing ASCVD    Assessment & Plan:  Right foot pain Assessment & Plan: Negative homan's sign Start Prednisone  starter pack  Xray ordered - awaiting results will follow up Advise icing, stretching and modifying activities that cause pain. Possible physical therapy referral, shoe inserts and NSAIDs for pain management. Discussed theThe RICE method: R: Rest the painful area for a few days. I: Ice the area for 20 minutes at a time to relieve inflammation. C: Compress the area  with a soft wrap to reduce swelling. E: Elevate the area by putting the foot on a few pillows.   Orders: -     DG Foot Complete Right; Future -     predniSONE ; Day 1: 6 tablets (60 mg) Day 2: 5 tablets (50 mg) Day 3: 4 tablets (40 mg) Day 4: 3 tablets (30 mg) Day 5: 2 tablets (20 mg) Day 6: 1 tablet (10 mg)  Dispense: 21 tablet; Refill: 0    Return if symptoms worsen or fail to improve.   Hilario Kidd Wilhelmena Falter, FNP

## 2024-04-11 NOTE — Patient Instructions (Signed)

## 2024-04-11 NOTE — Assessment & Plan Note (Addendum)
 Negative homan's sign Start Prednisone  starter pack  Xray ordered - awaiting results will follow up Advise icing, stretching and modifying activities that cause pain. Possible physical therapy referral, shoe inserts and NSAIDs for pain management. Discussed theThe RICE method: R: Rest the painful area for a few days. I: Ice the area for 20 minutes at a time to relieve inflammation. C: Compress the area with a soft wrap to reduce swelling. E: Elevate the area by putting the foot on a few pillows.

## 2024-04-12 ENCOUNTER — Encounter: Payer: Self-pay | Admitting: Internal Medicine

## 2024-04-12 ENCOUNTER — Inpatient Hospital Stay

## 2024-04-12 ENCOUNTER — Ambulatory Visit: Admitting: Internal Medicine

## 2024-04-12 ENCOUNTER — Inpatient Hospital Stay: Attending: Hematology

## 2024-04-12 VITALS — BP 99/72 | HR 54 | Temp 97.2°F | Resp 17

## 2024-04-12 VITALS — BP 106/76 | HR 60 | Ht 67.0 in

## 2024-04-12 DIAGNOSIS — D638 Anemia in other chronic diseases classified elsewhere: Secondary | ICD-10-CM

## 2024-04-12 DIAGNOSIS — N1831 Chronic kidney disease, stage 3a: Secondary | ICD-10-CM

## 2024-04-12 DIAGNOSIS — I739 Peripheral vascular disease, unspecified: Secondary | ICD-10-CM

## 2024-04-12 DIAGNOSIS — N1832 Chronic kidney disease, stage 3b: Secondary | ICD-10-CM | POA: Insufficient documentation

## 2024-04-12 DIAGNOSIS — I25708 Atherosclerosis of coronary artery bypass graft(s), unspecified, with other forms of angina pectoris: Secondary | ICD-10-CM

## 2024-04-12 DIAGNOSIS — E1140 Type 2 diabetes mellitus with diabetic neuropathy, unspecified: Secondary | ICD-10-CM | POA: Diagnosis not present

## 2024-04-12 DIAGNOSIS — E1159 Type 2 diabetes mellitus with other circulatory complications: Secondary | ICD-10-CM

## 2024-04-12 DIAGNOSIS — G40909 Epilepsy, unspecified, not intractable, without status epilepticus: Secondary | ICD-10-CM

## 2024-04-12 DIAGNOSIS — D631 Anemia in chronic kidney disease: Secondary | ICD-10-CM | POA: Diagnosis present

## 2024-04-12 LAB — CBC
HCT: 30 % — ABNORMAL LOW (ref 39.0–52.0)
Hemoglobin: 10.1 g/dL — ABNORMAL LOW (ref 13.0–17.0)
MCH: 30.7 pg (ref 26.0–34.0)
MCHC: 33.7 g/dL (ref 30.0–36.0)
MCV: 91.2 fL (ref 80.0–100.0)
Platelets: 153 K/uL (ref 150–400)
RBC: 3.29 MIL/uL — ABNORMAL LOW (ref 4.22–5.81)
RDW: 15.9 % — ABNORMAL HIGH (ref 11.5–15.5)
WBC: 4.1 K/uL (ref 4.0–10.5)
nRBC: 0 % (ref 0.0–0.2)

## 2024-04-12 MED ORDER — EPOETIN ALFA-EPBX 3000 UNIT/ML IJ SOLN
3000.0000 [IU] | Freq: Once | INTRAMUSCULAR | Status: AC
  Administered 2024-04-12: 3000 [IU] via SUBCUTANEOUS
  Filled 2024-04-12: qty 1

## 2024-04-12 MED ORDER — EPOETIN ALFA-EPBX 2000 UNIT/ML IJ SOLN
2000.0000 [IU] | Freq: Once | INTRAMUSCULAR | Status: AC
  Administered 2024-04-12: 2000 [IU] via SUBCUTANEOUS
  Filled 2024-04-12: qty 1

## 2024-04-12 MED ORDER — GABAPENTIN 300 MG PO CAPS: 300.0000 mg | ORAL_CAPSULE | Freq: Every day | ORAL | 1 refills | Status: DC

## 2024-04-12 MED ORDER — EPOETIN ALFA-EPBX 10000 UNIT/ML IJ SOLN: 5000.0000 [IU] | Freq: Once | INTRAMUSCULAR | Status: DC

## 2024-04-12 NOTE — Patient Instructions (Signed)

## 2024-04-12 NOTE — Assessment & Plan Note (Signed)
 History of CAD s/p CABG x 5 in 2017 Followed by cardiology Currently on Brilinta  and atorvastatin  80 mg daily

## 2024-04-12 NOTE — Assessment & Plan Note (Signed)
 Last BMP reviewed, GFR stays around 50 now Followed by nephrology Avoid nephrotoxic agents Advised to maintain adequate hydration

## 2024-04-12 NOTE — Assessment & Plan Note (Signed)
 On Depakote  and Lamictal  Followed by neurology

## 2024-04-12 NOTE — Assessment & Plan Note (Signed)
 Has persistent bilateral foot pain, which is likely due to neuropathy Currently takes gabapentin  100 mg 3 times daily, but reports drowsiness during daytime Since his symptoms are worse at nighttime, changed dose of gabapentin  to 300 mg nightly

## 2024-04-12 NOTE — Progress Notes (Signed)
 Patient tolerated injection with no complaints voiced.  Site clean and dry with no bruising or swelling noted at site.  See MAR for details.  Band aid applied.  Patient stable during and after injection.  Vss with discharge and left in satisfactory condition with no s/s of distress noted.

## 2024-04-12 NOTE — Assessment & Plan Note (Signed)
 Lab Results  Component Value Date   HGBA1C 5.8 (H) 08/23/2023    Diet controlled Advised to follow diabetic diet On statin F/u CMP, HbA1c and lipid panel Diabetic eye exam: Advised to follow up with Ophthalmology for diabetic eye exam

## 2024-04-12 NOTE — Progress Notes (Signed)
 Established Patient Office Visit  Subjective:  Patient ID: Melvin Taylor, male    DOB: May 07, 1966  Age: 58 y.o. MRN: [id number]  CC:  Chief Complaint  Patient presents with   Medical Management of Chronic Issues    3 month f/u     HPI Melvin Taylor is a 58 y.o. male with past medical history of HTN, CAD, type II DM, PAD, seizure disorder and CKD who presents for f/u of his chronic medical conditions.  He used to see Dr. Melvenia.  HTN and CAD: His BP is WNL today.  He takes amlodipine  10 mg QD, Imdur  30 mg QD currently.  He also takes atorvastatin  80 mg QD for HLD.  PAD: He recently was found to have PAD, s/p stent to right SFA.  He is currently followed by vascular surgery.  He takes Brilinta  and statin.  Does not tolerate aspirin .  Denies any signs of active bleeding currently.  He still complains of R > L foot pain, which is constant, sharp, nonradiating.  His vascular studies after stenting have showed improved perfusion.  Seizure disorder: Followed by neurology.  He takes Depakote  and Lamictal  currently.  Does not report any recent seizure-like activity.  CKD: His last BMP showed GFR of 47 in 07/25.  Denies any dysuria, hematuria, urinary hesitancy or resistance currently.  Past Medical History:  Diagnosis Date   Bipolar 1 disorder (HCC)    depression/anxiety   Bone spur    left heel   Cervical radiculopathy    Chronic back pain    Chronic chest wall pain    Chronic neck pain    Coronary artery disease    a. s/p CABG in 03/2016 with LIMA-LAD, SVG-D1, SVG-RCA, and Seq SVG-mid and distal OM   Diabetes mellitus    Type II   Hallucinations    long history of them   Headache(784.0)    History of gout    HTN (hypertension)    Insomnia    Neuropathy    Pain management    Polysubstance abuse (HCC)    Right leg pain    chronic   Schizophrenia (HCC)    Seizures (HCC)    last sz between July 5-9th, 2016; epilepsy   Sleep apnea    Stroke [facility])    Transfusion of  blood product refused for religious reason     Past Surgical History:  Procedure Laterality Date   ABDOMINAL AORTOGRAM N/A 02/14/2024   Procedure: ABDOMINAL AORTOGRAM;  Surgeon: Darron Deatrice LABOR, MD;  Location: [facility] INVASIVE CV LAB;  Service: Cardiovascular;  Laterality: N/A;   ANTERIOR CERVICAL DECOMP/DISCECTOMY FUSION N/A 12/14/2015   Procedure: ANTERIOR CERVICAL DECOMPRESSION/DISCECTOMY FUSION CERVICAL FIVE -SIX;  Surgeon: Victory Gunnels, MD;  Location: [facility] NEURO ORS;  Service: Neurosurgery;  Laterality: N/A;   BIOPSY  05/07/2015   Procedure: BIOPSY (Gastric);  Surgeon: Lamar CHRISTELLA Hollingshead, MD;  Location: [facility] ORS;  Service: Endoscopy;;   BURR HOLE FOR SUBDURAL HEMATOMA Right 10/24/2023   CARDIAC CATHETERIZATION N/A 03/07/2016   Procedure: Left Heart Cath and Coronary Angiography;  Surgeon: Victory LELON Sharps, MD;  Location: [facility] INVASIVE CV LAB;  Service: Cardiovascular;  Laterality: N/A;   COLONOSCOPY WITH PROPOFOL  N/A 05/07/2015   MFM:ejwrnonwpr diverticulosis, multiple colon polyps removed, tubular adenoma, serrated colon polyp. Next colonoscopy October 2019   COLONOSCOPY WITH PROPOFOL  N/A 08/02/2018   Procedure: COLONOSCOPY WITH PROPOFOL ;  Surgeon: Hollingshead Lamar CHRISTELLA, MD;  Location: [facility] ENDO SUITE;  Service: Endoscopy;  Laterality: N/A;  12:00pm  CORONARY ARTERY BYPASS GRAFT N/A 03/28/2016   Procedure: CORONARY ARTERY BYPASS GRAFTING (CABG) x 5 USING GREATER SAPHENOUS VEIN;  Surgeon: Dorise MARLA Fellers, MD;  Location: [facility] OR;  Service: Open Heart Surgery;  Laterality: N/A;   ENDOVEIN HARVEST OF GREATER SAPHENOUS VEIN Right 03/28/2016   Procedure: ENDOVEIN HARVEST OF GREATER SAPHENOUS VEIN;  Surgeon: Dorise MARLA Fellers, MD;  Location: [facility] OR;  Service: Open Heart Surgery;  Laterality: Right;   ESOPHAGEAL DILATION N/A 05/07/2015   Procedure: ESOPHAGEAL DILATION WITH 56FR MALONEY DILATOR;  Surgeon: Lamar CHRISTELLA Hollingshead, MD;  Location: [facility] ORS;  Service: Endoscopy;  Laterality: N/A;   ESOPHAGOGASTRODUODENOSCOPY (EGD) WITH  PROPOFOL  N/A 05/07/2015   RMR: Status post dilation of normal esophagus. Gastritis.   IR CT HEAD LTD  12/17/2020   IR CT HEAD LTD  12/17/2020   IR INTRA CRAN STENT  12/17/2020   IR PERCUTANEOUS ART THROMBECTOMY/INFUSION INTRACRANIAL INC DIAG ANGIO  12/17/2020   IR RADIOLOGIST EVAL & MGMT  03/25/2021   KNEE SURGERY Left    arthroscopy   LOWER EXTREMITY ANGIOGRAPHY Right 02/14/2024   Procedure: Lower Extremity Angiography;  Surgeon: Darron Deatrice LABOR, MD;  Location: [facility] INVASIVE CV LAB;  Service: Cardiovascular;  Laterality: Right;   LOWER EXTREMITY INTERVENTION Right 02/14/2024   Procedure: LOWER EXTREMITY INTERVENTION;  Surgeon: Darron Deatrice LABOR, MD;  Location: [facility] INVASIVE CV LAB;  Service: Cardiovascular;  Laterality: Right;   MANDIBLE FRACTURE SURGERY     POLYPECTOMY  05/07/2015   Procedure: POLYPECTOMY (Hepatic Flexure, Distal Transverse Colon, Rectal);  Surgeon: Lamar CHRISTELLA Hollingshead, MD;  Location: [facility] ORS;  Service: Endoscopy;;   RADIOLOGY WITH ANESTHESIA N/A 12/16/2020   Procedure: IR WITH ANESTHESIA;  Surgeon: Dolphus Carrion, MD;  Location: [facility] OR;  Service: Radiology;  Laterality: N/A;   TEE WITHOUT CARDIOVERSION N/A 03/28/2016   Procedure: TRANSESOPHAGEAL ECHOCARDIOGRAM (TEE);  Surgeon: Dorise MARLA Fellers, MD;  Location: [facility] OR;  Service: Open Heart Surgery;  Laterality: N/A;   TONSILLECTOMY     TRIGGER FINGER RELEASE Left 10/15/2021   Procedure: RELEASE TRIGGER FINGER/A-1 PULLEY left middle or long finger;  Surgeon: Margrette Taft BRAVO, MD;  Location: [facility] ORS;  Service: Orthopedics;  Laterality: Left;    Family History  Problem Relation Age of Onset   Diabetes Father    Hypertension Father    Sleep apnea Father    Stroke Sister    Arthritis Other    Diabetes Other    Asthma Other     Social History   Socioeconomic History   Marital status: Single    Spouse name: Not on file   Number of children: Not on file   Years of education: 11th grade   Highest education level: Not on  file  Occupational History   Occupation: unemployed    Employer: NOT EMPLOYED  Tobacco Use   Smoking status: Former    Current packs/day: 0.50    Average packs/day: 0.5 packs/day for 30.0 years (15.0 ttl pk-yrs)    Types: Cigarettes   Smokeless tobacco: Never   Tobacco comments:    3 cigs per day  Vaping Use   Vaping status: Never Used  Substance and Sexual Activity   Alcohol use: Not Currently   Drug use: Not Currently    Types: Marijuana   Sexual activity: Not Currently  Other Topics Concern   Not on file  Social History Narrative   09/13/21 Lives alone, caregiver daily, manages meds   Drinks a cup of coffee a day   Social  Drivers of Health   Financial Resource Strain: Patient Declined (11/23/2023)   [facility]   Overall Financial Resource Strain (CARDIA)    Difficulty of Paying Living Expenses: Patient declined  Food Insecurity: Patient Declined (11/23/2023)   Received from [facility]   Hunger Vital Sign    Within the past 12 months, you worried that your food would run out before you got the money to buy more.: Patient declined    Within the past 12 months, the food you bought just didn't last and you didn't have money to get more.: Patient declined  Transportation Needs: Patient Declined (11/23/2023)   Received from [facility] - Transportation    Lack of Transportation (Medical): Patient declined    Lack of Transportation (Non-Medical): Patient declined  Physical Activity: Not on file  Stress: Stress Concern Present (10/28/2023)   Received from [facility] - Occupational Stress Questionnaire    Feeling of Stress : Very much  Social Connections: Patient Unable To Answer (08/23/2023)   Social Connection and Isolation Panel    Frequency of Communication with Friends and Family: Patient unable to answer    Frequency of Social Gatherings with Friends and Family: Patient unable to answer     Attends Religious Services: Patient unable to answer    Active Member of Clubs or Organizations: Patient unable to answer    Attends Banker Meetings: Patient unable to answer    Marital Status: Patient unable to answer  Intimate Partner Violence: Not At Risk (10/23/2023)   Received from [facility]   HITS    Over the last 12 months how often did your partner physically hurt you?: Never    Over the last 12 months how often did your partner insult you or talk down to you?: Never    Over the last 12 months how often did your partner threaten you with physical harm?: Never    Over the last 12 months how often did your partner scream or curse at you?: Never    Outpatient Medications Prior to Visit  Medication Sig Dispense Refill   ACCU-CHEK GUIDE test strip USE AS DIRECTED TO MONITOR BLOOD SUGAR (4) TIMES DAILY. 100 strip 6   acetaminophen  (TYLENOL ) 325 MG tablet Take 2 tablets (650 mg total) by mouth every 6 (six) hours as needed for mild pain (pain score 1-3) (or Fever >/= 101).     allopurinol  (ZYLOPRIM ) 100 MG tablet Take 1 tablet (100 mg total) by mouth daily. 30 tablet 5   amLODipine  (NORVASC ) 10 MG tablet Take 1 tablet (10 mg total) by mouth daily. 30 tablet 3   atorvastatin  (LIPITOR ) 80 MG tablet Take 1 tablet (80 mg total) by mouth at bedtime. 30 tablet 3   BRILINTA  90 MG TABS tablet TAKE 1 TABLET BY MOUTH TWICE DAILY 180 tablet 5   Cholecalciferol (VITAMIN D3) 50 MCG (2000 UT) capsule Take 1 capsule (2,000 Units total) by mouth daily. 90 capsule 3   divalproex  (DEPAKOTE ) 250 MG DR tablet Take 1 tablet (250 mg total) by mouth 2 (two) times daily. 60 tablet 11   escitalopram  (LEXAPRO ) 20 MG tablet Take 1 tablet (20 mg total) by mouth daily. 30 tablet 3   ferrous sulfate  325 (65 FE) MG tablet Take 1 tablet (325 mg total) by mouth daily with breakfast. 30 tablet 3   HYDROcodone -acetaminophen  (NORCO/VICODIN) 5-325 MG tablet Take 2 tablets by mouth every 6 (six) hours  as  needed.     isosorbide  mononitrate (IMDUR ) 30 MG 24 hr tablet Take 1 tablet (30 mg total) by mouth every morning. 30 tablet 3   lamoTRIgine  (LAMICTAL ) 25 MG tablet Take 2 tablets (50 mg total) by mouth 2 (two) times daily. 120 tablet 11   Multiple Vitamin (MULTIVITAMIN WITH MINERALS) TABS tablet Take 1 tablet by mouth daily.     predniSONE  (STERAPRED UNI-PAK 21 TAB) 10 MG (21) TBPK tablet Day 1: 6 tablets (60 mg) Day 2: 5 tablets (50 mg) Day 3: 4 tablets (40 mg) Day 4: 3 tablets (30 mg) Day 5: 2 tablets (20 mg) Day 6: 1 tablet (10 mg) 21 tablet 0   UNABLE TO FIND 1 each by Does not apply route daily. Med Name: GLOVES 1B/M OR PRN 1 each 0   UNABLE TO FIND 1 each by Does not apply route daily. Med Name: PULL UPS 100/M OR PRN 1 each 0   UNABLE TO FIND 1 each by Does not apply route daily. Med Name: NUTRITIONAL SUPPLEMENT 180U/M OR PRN 1 each 0   UNABLE TO FIND 1 each by Does not apply route daily. Med Name: UNDER PADS/CHUX 30/M OR PRN 1 each 0   gabapentin  (NEURONTIN ) 100 MG capsule Take 1 capsule (100 mg total) by mouth 3 (three) times daily. 90 capsule 2   No facility-administered medications prior to visit.    Allergies  Allergen Reactions   Aspirin  Itching, Swelling and Other (See Comments)    Facial swelling    ROS Review of Systems  Constitutional:  Positive for fatigue. Negative for chills and fever.  HENT:  Negative for congestion and sore throat.   Eyes:  Negative for pain and discharge.  Respiratory:  Negative for cough and shortness of breath.   Cardiovascular:  Negative for chest pain and palpitations.  Gastrointestinal:  Negative for diarrhea, nausea and vomiting.  Endocrine: Negative for polydipsia and polyuria.  Genitourinary:  Negative for dysuria and hematuria.  Musculoskeletal:  Positive for arthralgias and gait problem. Negative for neck pain and neck stiffness.  Skin:  Negative for rash.  Neurological:  Negative for dizziness and weakness.  Psychiatric/Behavioral:   Negative for agitation and behavioral problems.       Objective:    Physical Exam Vitals reviewed.  Constitutional:      General: He is not in acute distress.    Appearance: He is not diaphoretic.     Comments: In wheelchair  HENT:     Head: Normocephalic and atraumatic.     Nose: Nose normal.     Mouth/Throat:     Mouth: Mucous membranes are moist.  Eyes:     General: No scleral icterus.    Extraocular Movements: Extraocular movements intact.  Cardiovascular:     Rate and Rhythm: Normal rate and regular rhythm.     Heart sounds: Normal heart sounds. No murmur heard. Pulmonary:     Breath sounds: Normal breath sounds. No wheezing or rales.  Musculoskeletal:     Cervical back: Neck supple. No tenderness.     Right lower leg: No edema.     Left lower leg: No edema.  Skin:    General: Skin is warm.     Findings: No rash.  Neurological:     General: No focal deficit present.     Mental Status: He is alert and oriented to person, place, and time.     Sensory: No sensory deficit.     Motor: Weakness (B/l  LE - 2/5) present.  Psychiatric:        Mood and Affect: Mood normal.        Behavior: Behavior normal.     BP 106/76   Pulse 60   Ht 5' 7 (1.702 m)   SpO2 96%   BMI 20.64 kg/m  Wt Readings from Last 3 Encounters:  03/13/24 131 lb 12.8 oz (59.8 kg)  02/26/24 138 lb 0.1 oz (62.6 kg)  02/14/24 138 lb (62.6 kg)    Lab Results  Component Value Date   TSH 2.703 03/02/2023   Lab Results  Component Value Date   WBC 4.1 04/12/2024   HGB 10.1 (L) 04/12/2024   HCT 30.0 (L) 04/12/2024   MCV 91.2 04/12/2024   PLT 153 04/12/2024   Lab Results  Component Value Date   NA 137 02/26/2024   K 3.7 02/26/2024   CO2 25 02/26/2024   GLUCOSE 86 02/26/2024   BUN 17 02/26/2024   CREATININE 1.66 (H) 02/26/2024   BILITOT 0.4 10/18/2023   ALKPHOS 67 10/18/2023   AST 17 10/18/2023   ALT 14 10/18/2023   PROT 5.9 (L) 10/18/2023   ALBUMIN  3.9 10/18/2023   CALCIUM  8.9  02/26/2024   ANIONGAP 8 02/26/2024   EGFR 46 (L) 11/14/2023   Lab Results  Component Value Date   CHOL 111 01/11/2024   Lab Results  Component Value Date   HDL 64 01/11/2024   Lab Results  Component Value Date   LDLCALC 31 01/11/2024   Lab Results  Component Value Date   TRIG 80 01/11/2024   Lab Results  Component Value Date   CHOLHDL 1.7 01/11/2024   Lab Results  Component Value Date   HGBA1C 5.8 (H) 08/23/2023      Assessment & Plan:   Problem List Items Addressed This Visit       Cardiovascular and Mediastinum   DM type 2 causing vascular disease (HCC)   Lab Results  Component Value Date   HGBA1C 5.8 (H) 08/23/2023    Diet controlled Advised to follow diabetic diet On statin F/u CMP, HbA1c and lipid panel Diabetic eye exam: Advised to follow up with Ophthalmology for diabetic eye exam      Relevant Orders   Microalbumin / creatinine urine ratio   CMP14+EGFR   Hemoglobin A1c   CAD (coronary artery disease)   History of CAD s/p CABG x 5 in 2017 Followed by cardiology Currently on Brilinta  and atorvastatin  80 mg daily      Peripheral arterial disease (HCC)   S/p drug-eluting stent to right SFA On Brilinta , does not tolerate aspirin  On atorvastatin  80 mg QD Followed by vascular surgery         Endocrine   Diabetes mellitus type II with neuropathy   Has persistent bilateral foot pain, which is likely due to neuropathy Currently takes gabapentin  100 mg 3 times daily, but reports drowsiness during daytime Since his symptoms are worse at nighttime, changed dose of gabapentin  to 300 mg nightly      Relevant Medications   gabapentin  (NEURONTIN ) 300 MG capsule     Nervous and Auditory   Seizure disorder (HCC) (Chronic)   On Depakote  and Lamictal  Followed by neurology      Relevant Medications   gabapentin  (NEURONTIN ) 300 MG capsule     Genitourinary   Stage 3a chronic kidney disease (CKD) (HCC) - Primary   Last BMP reviewed, GFR stays  around 50 now Followed by nephrology Avoid nephrotoxic agents Advised  to maintain adequate hydration      Relevant Orders   CBC with Differential/Platelet   CMP14+EGFR    Meds ordered this encounter  Medications   gabapentin  (NEURONTIN ) 300 MG capsule    Sig: Take 1 capsule (300 mg total) by mouth at bedtime.    Dispense:  90 capsule    Refill:  1    Follow-up: Return in about 4 months (around 08/12/2024) for DM and neuropathy.    Suzzane MARLA Blanch, MD

## 2024-04-12 NOTE — Patient Instructions (Signed)
 Please start taking Gabapentin  300 mg at bedtime.  Please continue to take medications as prescribed.  Please continue to follow low carb diet.  Please get fasting blood tests done before the next visit.

## 2024-04-12 NOTE — Assessment & Plan Note (Signed)
 S/p drug-eluting stent to right SFA On Brilinta , does not tolerate aspirin  On atorvastatin  80 mg QD Followed by vascular surgery

## 2024-04-19 ENCOUNTER — Ambulatory Visit: Payer: Self-pay | Admitting: Family Medicine

## 2024-04-19 NOTE — Progress Notes (Signed)
 Please inform patient,  Your imaging shows no fracture or dislocation in your right foot, and no significant soft tissue injury was seen. There is a small plantar heel spur and mild degenerative changes, which are common and often related to wear-and-tear over time which may be contributing to your foot pain.  Treatment and Next Steps:  Continue conservative care such as rest, ice, stretching, supportive footwear, and over-the-counter pain relief as needed.  For further evaluation and management, please follow up with [facility] in [place] at [phone]. They can recommend additional treatments if necessary.

## 2024-04-28 LAB — CBC WITH DIFFERENTIAL/PLATELET
Basophils Absolute: 0 x10E3/uL (ref 0.0–0.2)
Basos: 1 %
EOS (ABSOLUTE): 0.1 x10E3/uL (ref 0.0–0.4)
Eos: 1 %
Hematocrit: 32.2 % — ABNORMAL LOW (ref 37.5–51.0)
Hemoglobin: 10.3 g/dL — ABNORMAL LOW (ref 13.0–17.7)
Immature Grans (Abs): 0 x10E3/uL (ref 0.0–0.1)
Immature Granulocytes: 0 %
Lymphocytes Absolute: 2.8 x10E3/uL (ref 0.7–3.1)
Lymphs: 67 %
MCH: 30.7 pg (ref 26.6–33.0)
MCHC: 32 g/dL (ref 31.5–35.7)
MCV: 96 fL (ref 79–97)
Monocytes Absolute: 0.4 x10E3/uL (ref 0.1–0.9)
Monocytes: 10 %
Neutrophils Absolute: 0.9 x10E3/uL — ABNORMAL LOW (ref 1.4–7.0)
Neutrophils: 21 %
Platelets: 186 x10E3/uL (ref 150–450)
RBC: 3.36 x10E6/uL — ABNORMAL LOW (ref 4.14–5.80)
RDW: 14.7 % (ref 11.6–15.4)
WBC: 4.2 x10E3/uL (ref 3.4–10.8)

## 2024-04-28 LAB — CMP14+EGFR
ALT: 11 IU/L (ref 0–44)
AST: 18 IU/L (ref 0–40)
Albumin: 3.8 g/dL (ref 3.8–4.9)
Alkaline Phosphatase: 54 IU/L (ref 47–123)
BUN/Creatinine Ratio: 14 (ref 10–24)
BUN: 24 mg/dL (ref 8–27)
Bilirubin Total: 0.6 mg/dL (ref 0.0–1.2)
CO2: 22 mmol/L (ref 20–29)
Calcium: 8.9 mg/dL (ref 8.6–10.2)
Chloride: 103 mmol/L (ref 96–106)
Creatinine, Ser: 1.75 mg/dL — ABNORMAL HIGH (ref 0.76–1.27)
Globulin, Total: 2.3 g/dL (ref 1.5–4.5)
Glucose: 140 mg/dL — ABNORMAL HIGH (ref 70–99)
Potassium: 4.7 mmol/L (ref 3.5–5.2)
Sodium: 138 mmol/L (ref 134–144)
Total Protein: 6.1 g/dL (ref 6.0–8.5)
eGFR: 44 mL/min/1.73 — ABNORMAL LOW (ref >59)

## 2024-04-28 LAB — MICROALBUMIN / CREATININE URINE RATIO
Creatinine, Urine: 179.7 mg/dL
Microalb/Creat Ratio: 13 mg/g{creat} (ref 0–29)
Microalbumin, Urine: 23.7 ug/mL

## 2024-04-28 LAB — HEMOGLOBIN A1C
Est. average glucose Bld gHb Est-mCnc: 123 mg/dL
Hgb A1c MFr Bld: 5.9 % — ABNORMAL HIGH (ref 4.8–5.6)

## 2024-04-29 ENCOUNTER — Ambulatory Visit: Payer: Self-pay | Admitting: Internal Medicine

## 2024-05-02 ENCOUNTER — Other Ambulatory Visit: Payer: Self-pay

## 2024-05-02 DIAGNOSIS — D638 Anemia in other chronic diseases classified elsewhere: Secondary | ICD-10-CM

## 2024-05-03 ENCOUNTER — Inpatient Hospital Stay: Attending: Hematology

## 2024-05-03 ENCOUNTER — Inpatient Hospital Stay

## 2024-05-03 VITALS — BP 104/75 | HR 100 | Temp 97.9°F | Resp 19

## 2024-05-03 DIAGNOSIS — N1832 Chronic kidney disease, stage 3b: Secondary | ICD-10-CM

## 2024-05-03 DIAGNOSIS — D638 Anemia in other chronic diseases classified elsewhere: Secondary | ICD-10-CM

## 2024-05-03 DIAGNOSIS — N1831 Chronic kidney disease, stage 3a: Secondary | ICD-10-CM

## 2024-05-03 DIAGNOSIS — D631 Anemia in chronic kidney disease: Secondary | ICD-10-CM | POA: Insufficient documentation

## 2024-05-03 LAB — CBC
HCT: 28.4 % — ABNORMAL LOW (ref 39.0–52.0)
Hemoglobin: 9.5 g/dL — ABNORMAL LOW (ref 13.0–17.0)
MCH: 30.7 pg (ref 26.0–34.0)
MCHC: 33.5 g/dL (ref 30.0–36.0)
MCV: 91.9 fL (ref 80.0–100.0)
Platelets: 167 K/uL (ref 150–400)
RBC: 3.09 MIL/uL — ABNORMAL LOW (ref 4.22–5.81)
RDW: 16.3 % — ABNORMAL HIGH (ref 11.5–15.5)
WBC: 4.1 K/uL (ref 4.0–10.5)
nRBC: 0 % (ref 0.0–0.2)

## 2024-05-03 MED ORDER — EPOETIN ALFA-EPBX 10000 UNIT/ML IJ SOLN: 5000.0000 [IU] | Freq: Once | INTRAMUSCULAR | Status: DC

## 2024-05-03 MED ORDER — EPOETIN ALFA-EPBX 2000 UNIT/ML IJ SOLN
2000.0000 [IU] | Freq: Once | INTRAMUSCULAR | Status: AC
  Administered 2024-05-03: 2000 [IU] via SUBCUTANEOUS
  Filled 2024-05-03: qty 1

## 2024-05-03 MED ORDER — EPOETIN ALFA-EPBX 3000 UNIT/ML IJ SOLN
3000.0000 [IU] | Freq: Once | INTRAMUSCULAR | Status: AC
  Administered 2024-05-03: 3000 [IU] via SUBCUTANEOUS
  Filled 2024-05-03: qty 1

## 2024-05-03 NOTE — Patient Instructions (Signed)

## 2024-05-03 NOTE — Progress Notes (Signed)
 Patient's Hgb 9.5 and blood pressure stable. Patient tolerated Retacrit  injection with no complaints voiced.  Site clean and dry with no bruising or swelling noted at site.  See MAR for details.  Band aid applied.  Patient stable during and after injection.  Vss with discharge and left in satisfactory condition with no s/s of distress noted. All follow ups as scheduled.   Melvin Taylor

## 2024-05-23 ENCOUNTER — Other Ambulatory Visit: Payer: Self-pay

## 2024-05-23 DIAGNOSIS — D638 Anemia in other chronic diseases classified elsewhere: Secondary | ICD-10-CM

## 2024-05-24 ENCOUNTER — Inpatient Hospital Stay

## 2024-05-24 VITALS — BP 117/86 | HR 59 | Temp 97.2°F | Resp 18

## 2024-05-24 DIAGNOSIS — N1832 Chronic kidney disease, stage 3b: Secondary | ICD-10-CM

## 2024-05-24 DIAGNOSIS — N1831 Chronic kidney disease, stage 3a: Secondary | ICD-10-CM

## 2024-05-24 DIAGNOSIS — D638 Anemia in other chronic diseases classified elsewhere: Secondary | ICD-10-CM

## 2024-05-24 LAB — CBC
HCT: 29.3 % — ABNORMAL LOW (ref 39.0–52.0)
Hemoglobin: 9.6 g/dL — ABNORMAL LOW (ref 13.0–17.0)
MCH: 30.5 pg (ref 26.0–34.0)
MCHC: 32.8 g/dL (ref 30.0–36.0)
MCV: 93 fL (ref 80.0–100.0)
Platelets: 149 K/uL — ABNORMAL LOW (ref 150–400)
RBC: 3.15 MIL/uL — ABNORMAL LOW (ref 4.22–5.81)
RDW: 16.6 % — ABNORMAL HIGH (ref 11.5–15.5)
WBC: 4 K/uL (ref 4.0–10.5)
nRBC: 0 % (ref 0.0–0.2)

## 2024-05-24 MED ORDER — EPOETIN ALFA-EPBX 3000 UNIT/ML IJ SOLN
3000.0000 [IU] | Freq: Once | INTRAMUSCULAR | Status: AC
  Administered 2024-05-24: 3000 [IU] via SUBCUTANEOUS
  Filled 2024-05-24: qty 1

## 2024-05-24 MED ORDER — EPOETIN ALFA-EPBX 10000 UNIT/ML IJ SOLN: 5000.0000 [IU] | Freq: Once | INTRAMUSCULAR | Status: DC

## 2024-05-24 MED ORDER — EPOETIN ALFA-EPBX 2000 UNIT/ML IJ SOLN
2000.0000 [IU] | Freq: Once | INTRAMUSCULAR | Status: AC
  Administered 2024-05-24: 2000 [IU] via SUBCUTANEOUS
  Filled 2024-05-24: qty 1

## 2024-05-24 NOTE — Progress Notes (Signed)
 Patient tolerated injection with no complaints voiced.  Site clean and dry with no bruising or swelling noted at site.  See MAR for details.  Band aid applied.  Patient stable during and after injection.  Vss with discharge and left in satisfactory condition with no s/s of distress noted.

## 2024-05-24 NOTE — Patient Instructions (Signed)

## 2024-06-03 ENCOUNTER — Other Ambulatory Visit: Payer: Self-pay

## 2024-06-03 ENCOUNTER — Telehealth: Payer: Self-pay

## 2024-06-03 DIAGNOSIS — I63512 Cerebral infarction due to unspecified occlusion or stenosis of left middle cerebral artery: Secondary | ICD-10-CM

## 2024-06-03 NOTE — Telephone Encounter (Signed)
 Pt sister states bad foot pain was taking gabapentin  for it they stopped it because of lose stool, sister thinks its the sodium causing the gout flare ups but the pain today in his right foot is a 8 and swollen, Asking if she can up his Allopurinol 

## 2024-06-03 NOTE — Telephone Encounter (Signed)
 Copied from CRM [id number]. Topic: Clinical - Medication Question >> Jun 03, 2024 10:20 AM Victoria B wrote: Reason for CRM: Patient's sister called in, wanting to now if patient is still on med for gout

## 2024-06-04 ENCOUNTER — Telehealth: Payer: Self-pay

## 2024-06-04 NOTE — Telephone Encounter (Signed)
 Copied from CRM #[id number]. Topic: General - Other >> Jun 04, 2024 12:12 PM Joesph B wrote: Reason for CRM: patients sister is calling to follow up on request from yesterday. Would like to know if provider can up his allopurinol 

## 2024-06-05 ENCOUNTER — Other Ambulatory Visit: Payer: Self-pay

## 2024-06-05 DIAGNOSIS — M1A9XX0 Chronic gout, unspecified, without tophus (tophi): Secondary | ICD-10-CM

## 2024-06-05 MED ORDER — ALLOPURINOL 300 MG PO TABS: 300.0000 mg | ORAL_TABLET | Freq: Every day | ORAL | 3 refills | Status: AC

## 2024-06-05 NOTE — Telephone Encounter (Signed)
 Increase dose of allopurinol  sent to pharmacy

## 2024-06-05 NOTE — Telephone Encounter (Signed)
 Pt sister advised with verbal understanding

## 2024-06-13 ENCOUNTER — Other Ambulatory Visit: Payer: Self-pay

## 2024-06-13 DIAGNOSIS — D638 Anemia in other chronic diseases classified elsewhere: Secondary | ICD-10-CM

## 2024-06-14 ENCOUNTER — Inpatient Hospital Stay: Attending: Hematology

## 2024-06-14 ENCOUNTER — Inpatient Hospital Stay

## 2024-06-14 VITALS — BP 117/84 | HR 62 | Temp 96.7°F | Resp 16

## 2024-06-14 DIAGNOSIS — D631 Anemia in chronic kidney disease: Secondary | ICD-10-CM | POA: Diagnosis present

## 2024-06-14 DIAGNOSIS — D638 Anemia in other chronic diseases classified elsewhere: Secondary | ICD-10-CM

## 2024-06-14 DIAGNOSIS — N1832 Chronic kidney disease, stage 3b: Secondary | ICD-10-CM | POA: Insufficient documentation

## 2024-06-14 DIAGNOSIS — N1831 Chronic kidney disease, stage 3a: Secondary | ICD-10-CM

## 2024-06-14 LAB — CBC
HCT: 30 % — ABNORMAL LOW (ref 39.0–52.0)
Hemoglobin: 10 g/dL — ABNORMAL LOW (ref 13.0–17.0)
MCH: 30.6 pg (ref 26.0–34.0)
MCHC: 33.3 g/dL (ref 30.0–36.0)
MCV: 91.7 fL (ref 80.0–100.0)
Platelets: 124 K/uL — ABNORMAL LOW (ref 150–400)
RBC: 3.27 MIL/uL — ABNORMAL LOW (ref 4.22–5.81)
RDW: 16 % — ABNORMAL HIGH (ref 11.5–15.5)
WBC: 3.8 K/uL — ABNORMAL LOW (ref 4.0–10.5)
nRBC: 0 % (ref 0.0–0.2)

## 2024-06-14 MED ORDER — EPOETIN ALFA-EPBX 10000 UNIT/ML IJ SOLN: 5000.0000 [IU] | Freq: Once | INTRAMUSCULAR | Status: DC

## 2024-06-14 MED ORDER — EPOETIN ALFA-EPBX 2000 UNIT/ML IJ SOLN
2000.0000 [IU] | Freq: Once | INTRAMUSCULAR | Status: AC
  Administered 2024-06-14: 2000 [IU] via SUBCUTANEOUS
  Filled 2024-06-14: qty 1

## 2024-06-14 MED ORDER — EPOETIN ALFA-EPBX 3000 UNIT/ML IJ SOLN
3000.0000 [IU] | Freq: Once | INTRAMUSCULAR | Status: AC
  Administered 2024-06-14: 3000 [IU] via SUBCUTANEOUS
  Filled 2024-06-14: qty 1

## 2024-06-14 NOTE — Patient Instructions (Signed)
 [facility] [place] - A DEPT OF [facility]  Discharge Instructions: Thank you for choosing [facility] to provide your oncology and hematology care.  If you have a lab appointment with the Cancer Center - please note that after April 8th, 2024, all labs will be drawn in the cancer center.  You do not have to check in or register with the main entrance as you have in the past but will complete your check-in in the cancer center.  Wear comfortable clothing and clothing appropriate for easy access to any Portacath or PICC line.   We strive to give you quality time with your provider. You may need to reschedule your appointment if you arrive late (15 or more minutes).  Arriving late affects you and other patients whose appointments are after yours.  Also, if you miss three or more appointments without notifying the office, you may be dismissed from the clinic at the provider's discretion.      For prescription refill requests, have your pharmacy contact our office and allow 72 hours for refills to be completed.    Today you received the following chemotherapy and/or immunotherapy agents Retacrit      To help prevent nausea and vomiting after your treatment, we encourage you to take your nausea medication as directed.  BELOW ARE SYMPTOMS THAT SHOULD BE REPORTED IMMEDIATELY: *FEVER GREATER THAN 100.4 F (38 C) OR HIGHER *CHILLS OR SWEATING *NAUSEA AND VOMITING THAT IS NOT CONTROLLED WITH YOUR NAUSEA MEDICATION *UNUSUAL SHORTNESS OF BREATH *UNUSUAL BRUISING OR BLEEDING *URINARY PROBLEMS (pain or burning when urinating, or frequent urination) *BOWEL PROBLEMS (unusual diarrhea, constipation, pain near the anus) TENDERNESS IN MOUTH AND THROAT WITH OR WITHOUT PRESENCE OF ULCERS (sore throat, sores in mouth, or a toothache) UNUSUAL RASH, SWELLING OR PAIN  UNUSUAL VAGINAL DISCHARGE OR ITCHING   Items with * indicate a potential emergency and should be followed up  as soon as possible or go to the Emergency Department if any problems should occur.  Please show the CHEMOTHERAPY ALERT CARD or IMMUNOTHERAPY ALERT CARD at check-in to the Emergency Department and triage nurse.  Should you have questions after your visit or need to cancel or reschedule your appointment, please contact [facility] [place] - A DEPT OF [facility] [phone]  and follow the prompts.  Office hours are 8:00 a.m. to 4:30 p.m. Monday - Friday. Please note that voicemails left after 4:00 p.m. may not be returned until the following business day.  We are closed weekends and major holidays. You have access to a nurse at all times for urgent questions. Please call the main number to the clinic [phone] and follow the prompts.  For any non-urgent questions, you may also contact your provider using MyChart. We now offer e-Visits for anyone 90 and older to request care online for non-urgent symptoms. For details visit [email or web address].   Also download the MyChart app! Go to the app store, search "MyChart", open the app, select [place], and log in with your MyChart username and password.

## 2024-06-14 NOTE — Progress Notes (Signed)
 Melvin Taylor presents today for Retacrit injection per the provider's orders.  Stable during administration without incident; injection site WNL; see MAR for injection details.  Patient tolerated procedure well and without incident.  No questions or complaints noted at this time.

## 2024-07-02 ENCOUNTER — Ambulatory Visit: Admitting: Podiatry

## 2024-07-02 ENCOUNTER — Other Ambulatory Visit: Payer: Self-pay

## 2024-07-02 DIAGNOSIS — F419 Anxiety disorder, unspecified: Secondary | ICD-10-CM

## 2024-07-02 DIAGNOSIS — I2510 Atherosclerotic heart disease of native coronary artery without angina pectoris: Secondary | ICD-10-CM

## 2024-07-02 DIAGNOSIS — E782 Mixed hyperlipidemia: Secondary | ICD-10-CM

## 2024-07-02 DIAGNOSIS — I10 Essential (primary) hypertension: Secondary | ICD-10-CM

## 2024-07-10 ENCOUNTER — Other Ambulatory Visit: Payer: Self-pay

## 2024-07-10 DIAGNOSIS — D638 Anemia in other chronic diseases classified elsewhere: Secondary | ICD-10-CM

## 2024-07-10 DIAGNOSIS — D631 Anemia in chronic kidney disease: Secondary | ICD-10-CM

## 2024-07-12 ENCOUNTER — Inpatient Hospital Stay: Attending: Hematology | Admitting: Oncology

## 2024-07-12 ENCOUNTER — Inpatient Hospital Stay

## 2024-07-12 VITALS — BP 112/78 | HR 60 | Temp 98.1°F | Resp 19

## 2024-07-12 DIAGNOSIS — I739 Peripheral vascular disease, unspecified: Secondary | ICD-10-CM

## 2024-07-12 DIAGNOSIS — N189 Chronic kidney disease, unspecified: Secondary | ICD-10-CM

## 2024-07-12 DIAGNOSIS — D631 Anemia in chronic kidney disease: Secondary | ICD-10-CM

## 2024-07-12 DIAGNOSIS — D638 Anemia in other chronic diseases classified elsewhere: Secondary | ICD-10-CM

## 2024-07-12 DIAGNOSIS — N1832 Chronic kidney disease, stage 3b: Secondary | ICD-10-CM | POA: Diagnosis present

## 2024-07-12 DIAGNOSIS — N1831 Chronic kidney disease, stage 3a: Secondary | ICD-10-CM

## 2024-07-12 LAB — CBC WITH DIFFERENTIAL/PLATELET
Basophils Absolute: 0 K/uL (ref 0.0–0.1)
Basophils Relative: 0 %
Eosinophils Absolute: 0 K/uL (ref 0.0–0.5)
Eosinophils Relative: 0 %
HCT: 30.1 % — ABNORMAL LOW (ref 39.0–52.0)
Hemoglobin: 10.2 g/dL — ABNORMAL LOW (ref 13.0–17.0)
Lymphocytes Relative: 73 %
Lymphs Abs: 2.8 K/uL (ref 0.7–4.0)
MCH: 30.3 pg (ref 26.0–34.0)
MCHC: 33.9 g/dL (ref 30.0–36.0)
MCV: 89.3 fL (ref 80.0–100.0)
Monocytes Absolute: 0.1 K/uL (ref 0.1–1.0)
Monocytes Relative: 3 %
Neutro Abs: 0.9 K/uL — ABNORMAL LOW (ref 1.7–7.7)
Neutrophils Relative %: 24 %
Platelets: 169 K/uL (ref 150–400)
RBC: 3.37 MIL/uL — ABNORMAL LOW (ref 4.22–5.81)
RDW: 15.8 % — ABNORMAL HIGH (ref 11.5–15.5)
Smear Review: NORMAL
WBC: 3.8 K/uL — ABNORMAL LOW (ref 4.0–10.5)
nRBC: 0 % (ref 0.0–0.2)

## 2024-07-12 LAB — IRON AND TIBC
Iron: 196 ug/dL — ABNORMAL HIGH (ref 45–182)
Saturation Ratios: 88 % — ABNORMAL HIGH (ref 17.9–39.5)
TIBC: 224 ug/dL — ABNORMAL LOW (ref 250–450)
UIBC: 28 ug/dL

## 2024-07-12 LAB — FERRITIN: Ferritin: 739 ng/mL — ABNORMAL HIGH (ref 24–336)

## 2024-07-12 MED ORDER — EPOETIN ALFA-EPBX 10000 UNIT/ML IJ SOLN: 8000.0000 [IU] | Freq: Once | INTRAMUSCULAR | Status: DC

## 2024-07-12 MED ORDER — EPOETIN ALFA-EPBX 4000 UNIT/ML IJ SOLN
8000.0000 [IU] | Freq: Once | INTRAMUSCULAR | Status: AC
  Administered 2024-07-12: 8000 [IU] via SUBCUTANEOUS
  Filled 2024-07-12: qty 2

## 2024-07-12 NOTE — Progress Notes (Signed)
 [facility] OFFICE PROGRESS NOTE  Bevely Doffing, FNP  ASSESSMENT & PLAN:   Assessment & Plan Anemia in chronic kidney disease, unspecified CKD stage - Combination anemia from CKD and functional iron  deficiency. - He is receiving Epogen  5000 units every 3 weeks as needed. -He received 1 g Monoferric  on 12/27/2023.  Tolerated infusion well. -Labs from today show hemoglobin of 10.2, iron  saturations 88% and ferritin 739.  No additional IV iron  needed at this time. - He has been receiving Epogen  5000 units every 3 weeks since August with hemoglobin between 10 and 11 except for October where his hemoglobin dropped to 9.5 and 9.6.  Continue Epogen  but will increase from 5000 units to 8,000 units monthly. - RTC in 4 months for follow-up with repeat CBC, ferritin and iron  panel. PAD (peripheral artery disease) He is status post stent placement in his right lower extremity.  Reports significant amounts of pain every few minutes.  I do not see that he has scheduled follow-up with cardiology.  I have asked that he reach out to them given he should not be having that much pain.  He did recently have a ABI which showed significant improvement with patent right SFA stent.  It is recommended he return in 6 months.  Orders Placed This Encounter  Procedures   CBC    Standing Status:   Future    Expected Date:   11/10/2024    Expiration Date:   02/08/2025   Iron  and TIBC    Standing Status:   Future    Expected Date:   11/10/2024    Expiration Date:   02/08/2025    Release to patient:   Immediate   Ferritin    Standing Status:   Future    Expected Date:   11/10/2024    Expiration Date:   02/08/2025    Release to patient:   Immediate   CBC with Differential/Platelet    Standing Status:   Future    Expected Date:   11/10/2024    Expiration Date:   02/08/2025    Release to patient:   Immediate    INTERVAL HISTORY: Patient returns for follow-up.  Any interval hospitalizations, surgeries  or changes to baseline health.  In the interim, patient was seen by Dr. Tobie his family doctor for chronic issues.  Patient was recently found to have PAD status post stent to right SFA.  He is currently being followed by vascular surgery.  He takes Brilinta  and statins.  Does not tolerate aspirin .  Denies any signs of acute bleeding currently.  He still complains of right greater than left foot pain which is constant sharp and nonradiating.  His vascular studies after stenting that showed improved perfusion.  Patient continues Depakote  and Lamictal  for his seizure disorder.  Reports appetite is 100% energy levels are 75%.  He has 10 out of 10 right foot pain every few minutes due to his vascular disease.  He has occasional headaches.  Reports depression at times and trouble sleeping mainly staying asleep.  He has diarrhea intermittently.  Patient has been receiving Epogen  5000 units every 3 weeks with great tolerance.  He received 1 g Monoferric  on 12/27/2023.  Denies any bright red blood per rectum, melena or hematochezia.  He is unable to tolerate oral iron .  We reviewed CBC, ferritin and iron  panel.   SUMMARY OF HEMATOLOGIC HISTORY: Oncology History   No problem history exists.   1.  Normocytic anemia: - Patient  seen at the request of Dr. Melvenia - History of CVA, left MCA stent placement, chronically on Brilinta  - He is a Jehov[employer]'s Witness. - Colonoscopy (08/02/2018): Diverticulosis in the sigmoid and descending colon.  Otherwise normal exam. - EGD (2016): Normal esophagus, s/p Maloney dilatation.  Abnormal gastric mucosa of uncertain significance, chronic active gastritis. - Epogen  started on 11/23/2021, currently receiving 5000 units every 2 weeks - Received 3 infusions of Venofer  300 mg, last one on 01/06/2023    CBC    Component Value Date/Time   WBC 3.8 (L) 07/12/2024 1035   RBC 3.37 (L) 07/12/2024 1035   HGB 10.2 (L) 07/12/2024 1035   HGB 10.3 (L) 04/26/2024 1046   HCT 30.1 (L)  07/12/2024 1035   HCT 32.2 (L) 04/26/2024 1046   PLT 169 07/12/2024 1035   PLT 186 04/26/2024 1046   MCV 89.3 07/12/2024 1035   MCV 96 04/26/2024 1046   MCH 30.3 07/12/2024 1035   MCHC 33.9 07/12/2024 1035   RDW 15.8 (H) 07/12/2024 1035   RDW 14.7 04/26/2024 1046   LYMPHSABS PENDING 07/12/2024 1035   LYMPHSABS 2.8 04/26/2024 1046   MONOABS PENDING 07/12/2024 1035   EOSABS PENDING 07/12/2024 1035   EOSABS 0.1 04/26/2024 1046   BASOSABS PENDING 07/12/2024 1035   BASOSABS 0.0 04/26/2024 1046       Latest Ref Rng & Units 04/26/2024   10:46 AM 02/26/2024    1:12 PM 02/14/2024   10:03 AM  CMP  Glucose 70 - 99 mg/dL 859  86  73   BUN 8 - 27 mg/dL 24  17  17    Creatinine 0.76 - 1.27 mg/dL 8.24  8.33  8.54   Sodium 134 - 144 mmol/L 138  137  138   Potassium 3.5 - 5.2 mmol/L 4.7  3.7  3.9   Chloride 96 - 106 mmol/L 103  104  105   CO2 20 - 29 mmol/L 22  25  26    Calcium  8.6 - 10.2 mg/dL 8.9  8.9  8.9   Total Protein 6.0 - 8.5 g/dL 6.1     Total Bilirubin 0.0 - 1.2 mg/dL 0.6     Alkaline Phos 47 - 123 IU/L 54     AST 0 - 40 IU/L 18     ALT 0 - 44 IU/L 11        Lab Results  Component Value Date   FERRITIN 445 (H) 03/22/2024   VITAMINB12 560 12/14/2023    There were no vitals filed for this visit.   Review of System:  Review of Systems  Constitutional:  Positive for malaise/fatigue.  Gastrointestinal:  Positive for diarrhea.  Musculoskeletal:        Right foot pain   Psychiatric/Behavioral:  Positive for depression. The patient is nervous/anxious.     Physical Exam: Physical Exam Constitutional:      Appearance: Normal appearance.  HENT:     Head: Normocephalic and atraumatic.  Eyes:     Pupils: Pupils are equal, round, and reactive to light.  Cardiovascular:     Rate and Rhythm: Normal rate and regular rhythm.     Heart sounds: Normal heart sounds. No murmur heard. Pulmonary:     Effort: Pulmonary effort is normal.     Breath sounds: Normal breath sounds. No  wheezing.  Abdominal:     General: Bowel sounds are normal. There is no distension.     Palpations: Abdomen is soft.     Tenderness: There is no abdominal  tenderness.  Musculoskeletal:        General: Normal range of motion.     Cervical back: Normal range of motion.  Skin:    General: Skin is warm and dry.     Findings: No rash.  Neurological:     Mental Status: He is alert and oriented to person, place, and time.     Gait: Gait is intact.  Psychiatric:        Mood and Affect: Mood and affect normal.        Cognition and Memory: Memory normal.        Judgment: Judgment normal.      I spent 20 minutes dedicated to the care of this patient (face-to-face and non-face-to-face) on the date of the encounter to include what is described in the assessment and plan.,  Delon Hope, NP 07/12/2024 11:30 AM

## 2024-07-12 NOTE — Progress Notes (Signed)
 Retacrit injection given per orders. Patient tolerated it well without problems. Vitals stable and discharged home from clinic via wheelchair. Follow up as scheduled.

## 2024-07-12 NOTE — Assessment & Plan Note (Addendum)
 He is status post stent placement in his right lower extremity.  Reports significant amounts of pain every few minutes.  I do not see that he has scheduled follow-up with cardiology.  I have asked that he reach out to them given he should not be having that much pain.  He did recently have a ABI which showed significant improvement with patent right SFA stent.  It is recommended he return in 6 months.

## 2024-07-12 NOTE — Patient Instructions (Addendum)
 We are going to increase Epogen  from 5000 units to 7000 units and are going to give every month instead of every 3 weeks.   He will need an injection today for hemoglobin of 10.2.  The goal is to hopefully reduce the amount of injections to stabilize his hemoglobin between 10 and 11.  RTC in 4 months with labs and see NP.

## 2024-07-12 NOTE — Assessment & Plan Note (Addendum)
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 - He is receiving Epogen  5000 units every 3 weeks as needed. -He received 1 g Monoferric  on 12/27/2023.  Tolerated infusion well. -Labs from today show hemoglobin of 10.2, iron  saturations 88% and ferritin 739.  No additional IV iron  needed at this time. - He has been receiving Epogen  5000 units every 3 weeks since August with hemoglobin between 10 and 11 except for October where his hemoglobin dropped to 9.5 and 9.6.  Continue Epogen  but will increase from 5000 units to 8,000 units monthly. - RTC in 4 months for follow-up with repeat CBC, ferritin and iron  panel.

## 2024-07-24 ENCOUNTER — Ambulatory Visit: Admitting: Podiatry

## 2024-07-29 ENCOUNTER — Encounter: Payer: Self-pay | Admitting: *Deleted

## 2024-08-06 ENCOUNTER — Ambulatory Visit: Payer: Self-pay

## 2024-08-06 NOTE — Telephone Encounter (Signed)
 FYI Only or Action Required?: Action required by provider: request for appointment and clinical question for provider.  Patient was last seen in primary care on 04/12/2024 by Tobie Suzzane POUR, MD.  Called Nurse Triage reporting Low Blood Pressure.  Symptoms began several months ago.  Interventions attempted: Other: Holding BP medication.  Symptoms are: stable.  Triage Disposition: No disposition on file.  Patient/caregiver understands and will follow disposition?:  Reason for Disposition  Patient wants doctor (or NP/PA) to measure BP  Answer Assessment - Initial Assessment Questions Patient's sister Bernarda calling in with concerns of low blood pressure readings over the last month. Reporting 109/68 yesterday. Sister and home health nurse hold BP medications if BP is low. PCP has not given parameters for holding that medication. Patient has appointment with PCP on 08/13/24, but needing to reschedule due to home health aid not getting there until 9am. No available appointments within office to reschedule. Bernarda would like a call back for a sooner appointment, and advice on what to do regarding patient's blood pressure medication in the mean time. Please advise. [phone]  1. BLOOD PRESSURE: What is your blood pressure? Did you take at least two measurements 5 minutes apart?    116/67 11:05 am, did not take BP medication this morning  2. ONSET: When did you take your blood pressure?     Last month or so  3. HOW: How did you take your blood pressure? (e.g., visiting nurse, automatic home BP monitor)     Visiting nurse uses automatic BP monitor  4. HISTORY: Do you have a history of low blood pressure? What is your blood pressure normally?     Denies  5. MEDICINES: Are you taking any medicines for blood pressure? If Yes, ask: Have they been changed recently?     amLODipine  (NORVASC ) 10 MG tablet, isosorbide  mononitrate (IMDUR ) 30 MG 24 hr tablet  6. PULSE RATE: Do you  know what your pulse rate is?      64  7. OTHER SYMPTOMS: Have you been sick recently? Have you had a recent injury?     Reports patient does not act like himself when its low, does not want to do a lot. Fatigue.  Protocols used: Blood Pressure - Low-A-AH  Copied from CRM #[id number]. Topic: Clinical - Red Word Triage >> Aug 06, 2024  2:56 PM Montie POUR wrote: Red Word that prompted transfer to Nurse Triage:  For the past month, his blood pressure has been bottoming out. 106/69; 106/74/106/59;  Sister, Bernarda, said she tells Byrne not to take blood pressure medicine. Sister needs to reschedule appointment for 08-13-24 for a later time due to caregiver does not arrive until 9 AM

## 2024-08-08 ENCOUNTER — Other Ambulatory Visit: Payer: Self-pay

## 2024-08-08 DIAGNOSIS — N1831 Chronic kidney disease, stage 3a: Secondary | ICD-10-CM

## 2024-08-09 ENCOUNTER — Inpatient Hospital Stay: Attending: Hematology

## 2024-08-09 ENCOUNTER — Inpatient Hospital Stay

## 2024-08-09 VITALS — BP 118/78 | HR 58 | Temp 96.9°F | Resp 16

## 2024-08-09 DIAGNOSIS — N1831 Chronic kidney disease, stage 3a: Secondary | ICD-10-CM

## 2024-08-09 DIAGNOSIS — N1832 Chronic kidney disease, stage 3b: Secondary | ICD-10-CM | POA: Diagnosis present

## 2024-08-09 DIAGNOSIS — D631 Anemia in chronic kidney disease: Secondary | ICD-10-CM | POA: Diagnosis present

## 2024-08-09 LAB — CBC
HCT: 28.5 % — ABNORMAL LOW (ref 39.0–52.0)
Hemoglobin: 9.7 g/dL — ABNORMAL LOW (ref 13.0–17.0)
MCH: 30.4 pg (ref 26.0–34.0)
MCHC: 34 g/dL (ref 30.0–36.0)
MCV: 89.3 fL (ref 80.0–100.0)
Platelets: 148 K/uL — ABNORMAL LOW (ref 150–400)
RBC: 3.19 MIL/uL — ABNORMAL LOW (ref 4.22–5.81)
RDW: 16.9 % — ABNORMAL HIGH (ref 11.5–15.5)
WBC: 4.4 K/uL (ref 4.0–10.5)
nRBC: 0 % (ref 0.0–0.2)

## 2024-08-09 MED ORDER — EPOETIN ALFA-EPBX 10000 UNIT/ML IJ SOLN: 8000.0000 [IU] | Freq: Once | INTRAMUSCULAR | Status: DC

## 2024-08-09 MED ORDER — EPOETIN ALFA-EPBX 4000 UNIT/ML IJ SOLN
8000.0000 [IU] | Freq: Once | INTRAMUSCULAR | Status: AC
  Administered 2024-08-09: 8000 [IU] via SUBCUTANEOUS
  Filled 2024-08-09: qty 2

## 2024-08-09 NOTE — Patient Instructions (Signed)

## 2024-08-09 NOTE — Progress Notes (Signed)
 Patient's hgb 9.7 and blood pressure stable. Patient tolerated retacrit  injection with no complaints voiced.  Site clean and dry with no bruising or swelling noted at site.  See MAR for details.  Band aid applied.  Patient stable during and after injection.  Vss with discharge and left in satisfactory condition with no s/s of distress noted. All follow ups as scheduled.   Melvin Taylor

## 2024-08-13 ENCOUNTER — Ambulatory Visit

## 2024-08-13 VITALS — BP 98/62 | HR 52 | Ht 67.0 in | Wt 127.0 lb

## 2024-08-13 DIAGNOSIS — I9589 Other hypotension: Secondary | ICD-10-CM

## 2024-08-13 DIAGNOSIS — R197 Diarrhea, unspecified: Secondary | ICD-10-CM | POA: Diagnosis not present

## 2024-08-13 DIAGNOSIS — R634 Abnormal weight loss: Secondary | ICD-10-CM

## 2024-08-13 MED ORDER — COLESTIPOL HCL 1 G PO TABS: 1.0000 g | ORAL_TABLET | Freq: Two times a day (BID) | ORAL | 5 refills | Status: AC

## 2024-08-13 NOTE — Patient Instructions (Signed)
 Continue to hold amlodipine  and Imdur  for BP <120/80.

## 2024-08-13 NOTE — Progress Notes (Unsigned)
 "  Established Patient Office Visit  Subjective   Patient ID: Melvin Taylor, male    DOB: August 23, 1965  Age: 59 y.o. MRN: [id number]  Chief Complaint  Patient presents with   Hypotension    Low blood pressure    HPI Discussed the use of AI scribe software for clinical note transcription with the patient, who gave verbal consent to proceed.  History of Present Illness    Melvin Taylor is a 59 year old male who presents with concerns about hypotension and medication management. He is accompanied by his caregiver, Jon.  Hypotension and medication management - Low blood pressure readings as low as 95/63 mmHg in October, with more recent values of 107/59 mmHg, 109/68 mmHg, and 105/68 mmHg. - Blood pressure readings obtained without administration of amlodipine  and isosorbide . - Blood pressure drops significantly when antihypertensive medications are administered, requiring use of souse meat to elevate blood pressure. - Despite holding antihypertensive medications, blood pressure remains low, with a recent reading of 98/62 mmHg.  Weight loss and nutritional intake - Weight decreased from 138 pounds in July to 127 pounds currently, representing a loss of approximately 10 pounds. - Eats a substantial breakfast but frequently skips lunch.  Diarrhea - Intermittent watery diarrhea ongoing for approximately one month. - Diarrhea occurs two to three times daily. - Occasional episodes of incontinence, requiring assistance to reach the bathroom.  Medication changes and adverse effects - Current medications include Lamictal , Lexapro , Depakote , and Brilinta . - Gabapentin  discontinued due to severe diarrhea and lack of perceived benefit. - Not currently taking hydrocodone .     Patient Active Problem List   Diagnosis Date Noted   Diarrhea 08/14/2024   Right foot pain 04/11/2024   PAD (peripheral artery disease) 01/11/2024   Right leg pain 12/13/2023   Subarachnoid hemorrhage  following injury, no loss of consciousness (HCC) 10/18/2023   H/O: CVA (cerebrovascular accident) 09/29/2023   Normocytic anemia 02/06/2023   Gout 02/02/2023   Right shoulder pain 02/02/2023   Hypotension 12/16/2022   Protein-calorie malnutrition, severe 08/30/2022   OSA (obstructive sleep apnea) 08/27/2022   Acute metabolic encephalopathy 08/27/2022   Stage 3a chronic kidney disease (CKD) (HCC) 06/06/2022   Trigger finger, left middle finger    Middle cerebral artery embolism, 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   Malignant hypertension    Acute ischemic left MCA stroke (HCC) 12/16/2020   Current smoker 06/21/2019   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   Anemia in chronic kidney disease 07/04/2018   Hyperlipidemia 03/11/2016   Ischemic chest pain    NSTEMI (non-ST elevated myocardial infarction) (HCC)    Coronary atherosclerosis of native coronary artery 03/03/2016   Cervical spinal stenosis 12/14/2015   Weight loss, unintentional 08/25/2015   History of colonic polyps    Diverticulosis of colon without hemorrhage    Mucosal abnormality of stomach    Dysphagia 04/16/2015   Partial symptomatic epilepsy with complex partial seizures, intractable, without status epilepticus (HCC) 11/10/2014   Anxiety and depression 10/15/2014   Schizophrenia (HCC) 10/15/2014   Degeneration disease of medial meniscus 01/13/2011   Other internal derangements of unspecified knee 01/13/2011   Chronic pain of right knee 12/28/2010   Medial meniscus, posterior horn derangement 12/28/2010    ROS    Objective:     BP 98/62 (BP Location: Left Arm, Patient Position: Sitting, Cuff Size: Normal) Comment: without BP medications  Pulse (!) 52   Ht 5' 7 (1.702 m)   Wt 127 lb (57.6 kg)  SpO2 100%   BMI 19.89 kg/m  BP Readings from Last 3 Encounters:  08/13/24 98/62  08/09/24 118/78  07/12/24 112/78   Wt Readings from Last 3 Encounters:  08/13/24 127 lb (57.6 kg)  03/13/24 131 lb 12.8 oz (59.8 kg)   02/26/24 138 lb 0.1 oz (62.6 kg)      Physical Exam Vitals and nursing note reviewed.  Constitutional:      Appearance: Normal appearance.  HENT:     Head: Normocephalic.  Eyes:     Extraocular Movements: Extraocular movements intact.     Pupils: Pupils are equal, round, and reactive to light.  Cardiovascular:     Rate and Rhythm: Normal rate and regular rhythm.  Pulmonary:     Effort: Pulmonary effort is normal.     Breath sounds: Normal breath sounds.  Musculoskeletal:     Cervical back: Normal range of motion and neck supple.  Neurological:     Mental Status: He is alert and oriented to person, place, and time.  Psychiatric:        Mood and Affect: Mood normal.        Thought Content: Thought content normal.     No results found for any visits on 08/13/24.    The ASCVD Risk score (Arnett DK, et al., 2019) failed to calculate for the following reasons:   Risk score cannot be calculated because patient has a medical history suggesting prior/existing ASCVD   * - Cholesterol units were assumed    Assessment & Plan:   Problem List Items Addressed This Visit       Cardiovascular and Mediastinum   Hypotension   Episodes of hypotension with significant blood pressure drop post-amlodipine  and isosorbide . Current BP 98/62 mmHg, pulse 52 bpm. Cardiologist appointment scheduled. - Continue to hold amlodipine  and isosorbide . - Monitor blood pressure twice daily. - If BP exceeds 120/80 mmHg, consider half dose of amlodipine . - Follow up with cardiologist next month.      Relevant Medications   colestipol  (COLESTID ) 1 g tablet     Other   Weight loss, unintentional   Approximately 10-pound loss since July. Current weight 127 pounds. Good appetite, regular meals, lunch often skipped. - Continue to monitor weight and dietary intake.      Diarrhea - Primary   Diarrhea likely related to Brilinta . Gabapentin  previously caused severe diarrhea but discontinued. -  Prescribed colestipol  at lowest dose to manage diarrhea and potentially lower cholesterol.      Relevant Medications   colestipol  (COLESTID ) 1 g tablet   Return in about 3 months (around 11/11/2024) for chronic follow-up with PCP.    Leita Longs, FNP  "

## 2024-08-14 ENCOUNTER — Telehealth: Payer: Self-pay

## 2024-08-14 DIAGNOSIS — R197 Diarrhea, unspecified: Secondary | ICD-10-CM | POA: Insufficient documentation

## 2024-08-14 NOTE — Assessment & Plan Note (Signed)
 Episodes of hypotension with significant blood pressure drop post-amlodipine  and isosorbide . Current BP 98/62 mmHg, pulse 52 bpm. Cardiologist appointment scheduled. - Continue to hold amlodipine  and isosorbide . - Monitor blood pressure twice daily. - If BP exceeds 120/80 mmHg, consider half dose of amlodipine . - Follow up with cardiologist next month.

## 2024-08-14 NOTE — Assessment & Plan Note (Signed)
 Diarrhea likely related to Brilinta . Gabapentin  previously caused severe diarrhea but discontinued. - Prescribed colestipol  at lowest dose to manage diarrhea and potentially lower cholesterol.

## 2024-08-14 NOTE — Assessment & Plan Note (Signed)
 Approximately 10-pound loss since July. Current weight 127 pounds. Good appetite, regular meals, lunch often skipped. - Continue to monitor weight and dietary intake.

## 2024-08-14 NOTE — Telephone Encounter (Signed)
 Copied from CRM [id number]. Topic: Clinical - Medical Advice >> Aug 14, 2024  3:18 PM Antwanette L wrote: Reason for CRM: Bernarda the pt poa/ sister is calling regarding the pt prescription for colestipol  (COLESTID ) 1 g tablet which is intended to help loosen his stool. She reports that the pt must take the medicine 4 hours before eating and 4 hours going to bed and she feels this will not be effective. She's requesting an alternative and wants to know if the pt can take an anti-diarrhea medication instead. Alicia can be reached at [phone]

## 2024-08-15 NOTE — Telephone Encounter (Signed)
 I'm not sure what her source of information is, but this medication is taken before first meal of the day and at bedtime.  I just prescribed it two days ago for his diarrhea, not to help loosen his stools as message says.  Recommend he try one tablet a day to start with and see how it works.  He and his caregiver reported that the diarrhea is causing him significant issues.

## 2024-08-16 NOTE — Telephone Encounter (Signed)
 [email or web address]

## 2024-08-23 ENCOUNTER — Other Ambulatory Visit: Payer: Self-pay

## 2024-08-27 ENCOUNTER — Telehealth: Payer: Self-pay

## 2024-08-27 NOTE — Telephone Encounter (Signed)
 I recommend he f/u with his neurologist, Dr. Margaret in [facility] for Parkinson's testing.  They are the one's who test for this.  There isn't exactly a blood test for Parkinson's

## 2024-08-27 NOTE — Telephone Encounter (Signed)
 Copied from CRM #[id number]. Topic: Clinical - Request for Lab/Test Order >> Aug 27, 2024 11:22 AM Marylynn H wrote: Reason for CRM: Patients sister Bernarda called in and wanted to speak with nurse/PCP regarding getting the patient tested for Parkinsonism. Please reach out and advise # [phone]

## 2024-08-27 NOTE — Telephone Encounter (Signed)
 Sister advised.

## 2024-09-06 ENCOUNTER — Inpatient Hospital Stay

## 2024-09-06 ENCOUNTER — Inpatient Hospital Stay: Attending: Hematology

## 2024-09-06 VITALS — BP 116/68 | HR 66 | Temp 97.2°F | Resp 18

## 2024-09-06 DIAGNOSIS — N1831 Chronic kidney disease, stage 3a: Secondary | ICD-10-CM

## 2024-09-06 DIAGNOSIS — D631 Anemia in chronic kidney disease: Secondary | ICD-10-CM

## 2024-09-06 DIAGNOSIS — I739 Peripheral vascular disease, unspecified: Secondary | ICD-10-CM

## 2024-09-06 LAB — CBC
HCT: 24.8 % — ABNORMAL LOW (ref 39.0–52.0)
Hemoglobin: 8.4 g/dL — ABNORMAL LOW (ref 13.0–17.0)
MCH: 30.3 pg (ref 26.0–34.0)
MCHC: 33.9 g/dL (ref 30.0–36.0)
MCV: 89.5 fL (ref 80.0–100.0)
Platelets: 144 10*3/uL — ABNORMAL LOW (ref 150–400)
RBC: 2.77 MIL/uL — ABNORMAL LOW (ref 4.22–5.81)
RDW: 17.3 % — ABNORMAL HIGH (ref 11.5–15.5)
WBC: 4 10*3/uL (ref 4.0–10.5)
nRBC: 0 % (ref 0.0–0.2)

## 2024-09-06 MED ORDER — EPOETIN ALFA-EPBX 10000 UNIT/ML IJ SOLN: 8000.0000 [IU] | Freq: Once | INTRAMUSCULAR | Status: DC

## 2024-09-06 MED ORDER — EPOETIN ALFA-EPBX 4000 UNIT/ML IJ SOLN
8000.0000 [IU] | Freq: Once | INTRAMUSCULAR | Status: AC
  Administered 2024-09-06: 8000 [IU] via SUBCUTANEOUS
  Filled 2024-09-06: qty 2

## 2024-09-06 NOTE — Patient Instructions (Signed)
 [facility] [place] - A DEPT OF [facility]  Discharge Instructions: Thank you for choosing [facility] to provide your oncology and hematology care.  If you have a lab appointment with the Cancer Center - please note that after April 8th, 2024, all labs will be drawn in the cancer center.  You do not have to check in or register with the main entrance as you have in the past but will complete your check-in in the cancer center.  Wear comfortable clothing and clothing appropriate for easy access to any Portacath or PICC line.   We strive to give you quality time with your provider. You may need to reschedule your appointment if you arrive late (15 or more minutes).  Arriving late affects you and other patients whose appointments are after yours.  Also, if you miss three or more appointments without notifying the office, you may be dismissed from the clinic at the provider's discretion.      For prescription refill requests, have your pharmacy contact our office and allow 72 hours for refills to be completed.    Today you received the following Retacrit, return as scheduled.   To help prevent nausea and vomiting after your treatment, we encourage you to take your nausea medication as directed.  BELOW ARE SYMPTOMS THAT SHOULD BE REPORTED IMMEDIATELY: *FEVER GREATER THAN 100.4 F (38 C) OR HIGHER *CHILLS OR SWEATING *NAUSEA AND VOMITING THAT IS NOT CONTROLLED WITH YOUR NAUSEA MEDICATION *UNUSUAL SHORTNESS OF BREATH *UNUSUAL BRUISING OR BLEEDING *URINARY PROBLEMS (pain or burning when urinating, or frequent urination) *BOWEL PROBLEMS (unusual diarrhea, constipation, pain near the anus) TENDERNESS IN MOUTH AND THROAT WITH OR WITHOUT PRESENCE OF ULCERS (sore throat, sores in mouth, or a toothache) UNUSUAL RASH, SWELLING OR PAIN  UNUSUAL VAGINAL DISCHARGE OR ITCHING   Items with * indicate a potential emergency and should be followed up as soon as possible or  go to the Emergency Department if any problems should occur.  Please show the CHEMOTHERAPY ALERT CARD or IMMUNOTHERAPY ALERT CARD at check-in to the Emergency Department and triage nurse.  Should you have questions after your visit or need to cancel or reschedule your appointment, please contact [facility] [place] - A DEPT OF [facility]igh[facility] [phone]  and follow the prompts.  Office hours are 8:00 a.m. to 4:30 p.m. Monday - Friday. Please note that voicemails left after 4:00 p.m. may not be returned until the following business day.  We are closed weekends and major holidays. You have access to a nurse at all times for urgent questions. Please call the main number to the clinic [phone] and follow the prompts.  For any non-urgent questions, you may also contact your provider using MyChart. We now offer e-Visits for anyone 42 and older to request care online for non-urgent symptoms. For details visit [email or web address].   Also download the MyChart app! Go to the app store, search "MyChart", open the app, select [place], and log in with your MyChart username and password.

## 2024-09-06 NOTE — Progress Notes (Signed)
Patient presents today for Retacrit injection. Hemoglobin reviewed prior to administration. VSS tolerated without incident or complaint. See MAR for details. Patient stable during and after injection. Patient discharged in satisfactory condition with no s/s of distress noted.  

## 2024-09-16 ENCOUNTER — Ambulatory Visit

## 2024-10-04 ENCOUNTER — Inpatient Hospital Stay: Attending: Hematology

## 2024-10-04 ENCOUNTER — Inpatient Hospital Stay

## 2024-11-01 ENCOUNTER — Inpatient Hospital Stay: Attending: Hematology | Admitting: Oncology

## 2024-11-01 ENCOUNTER — Inpatient Hospital Stay

## 2024-11-14 ENCOUNTER — Ambulatory Visit: Payer: Self-pay

## 2024-11-18 ENCOUNTER — Ambulatory Visit: Admitting: Adult Health
